# Patient Record
Sex: Female | Born: 1945 | Race: White | Hispanic: No | Marital: Married | Smoking: Never smoker
Health system: Southern US, Community
[De-identification: ages and names within clinical notes are randomized; demographics above are authoritative.]

## PROBLEM LIST (undated history)

## (undated) DIAGNOSIS — J45901 Unspecified asthma with (acute) exacerbation: Secondary | ICD-10-CM

## (undated) DIAGNOSIS — I82409 Acute embolism and thrombosis of unspecified deep veins of unspecified lower extremity: Secondary | ICD-10-CM

## (undated) DIAGNOSIS — Z9289 Personal history of other medical treatment: Secondary | ICD-10-CM

## (undated) DIAGNOSIS — R011 Cardiac murmur, unspecified: Secondary | ICD-10-CM

## (undated) DIAGNOSIS — Z5189 Encounter for other specified aftercare: Secondary | ICD-10-CM

## (undated) DIAGNOSIS — M1990 Unspecified osteoarthritis, unspecified site: Secondary | ICD-10-CM

## (undated) DIAGNOSIS — I219 Acute myocardial infarction, unspecified: Secondary | ICD-10-CM

## (undated) DIAGNOSIS — J180 Bronchopneumonia, unspecified organism: Secondary | ICD-10-CM

## (undated) DIAGNOSIS — G459 Transient cerebral ischemic attack, unspecified: Secondary | ICD-10-CM

## (undated) DIAGNOSIS — H269 Unspecified cataract: Secondary | ICD-10-CM

## (undated) DIAGNOSIS — E039 Hypothyroidism, unspecified: Secondary | ICD-10-CM

## (undated) DIAGNOSIS — K635 Polyp of colon: Secondary | ICD-10-CM

## (undated) DIAGNOSIS — K5792 Diverticulitis of intestine, part unspecified, without perforation or abscess without bleeding: Secondary | ICD-10-CM

## (undated) DIAGNOSIS — K859 Acute pancreatitis without necrosis or infection, unspecified: Secondary | ICD-10-CM

## (undated) DIAGNOSIS — E079 Disorder of thyroid, unspecified: Secondary | ICD-10-CM

## (undated) DIAGNOSIS — T7840XA Allergy, unspecified, initial encounter: Secondary | ICD-10-CM

## (undated) DIAGNOSIS — G43909 Migraine, unspecified, not intractable, without status migrainosus: Secondary | ICD-10-CM

## (undated) DIAGNOSIS — Z9889 Other specified postprocedural states: Secondary | ICD-10-CM

## (undated) HISTORY — DX: Unspecified osteoarthritis, unspecified site: M19.90

## (undated) HISTORY — DX: Diverticulitis of intestine, part unspecified, without perforation or abscess without bleeding: K57.92

## (undated) HISTORY — PX: TOTAL KNEE ARTHROPLASTY: SHX125

## (undated) HISTORY — DX: Polyp of colon: K63.5

## (undated) HISTORY — DX: Cardiac murmur, unspecified: R01.1

## (undated) HISTORY — DX: Personal history of other medical treatment: Z92.89

## (undated) HISTORY — DX: Migraine, unspecified, not intractable, without status migrainosus: G43.909

## (undated) HISTORY — DX: Allergy, unspecified, initial encounter: T78.40XA

## (undated) HISTORY — DX: Unspecified asthma with (acute) exacerbation: J45.901

## (undated) HISTORY — DX: Other specified postprocedural states: Z98.890

## (undated) HISTORY — DX: Bronchopneumonia, unspecified organism: J18.0

## (undated) HISTORY — DX: Acute embolism and thrombosis of unspecified deep veins of unspecified lower extremity: I82.409

## (undated) HISTORY — DX: Acute pancreatitis without necrosis or infection, unspecified: K85.90

## (undated) HISTORY — PX: BREAST BIOPSY: SHX20

## (undated) HISTORY — DX: Encounter for other specified aftercare: Z51.89

## (undated) HISTORY — DX: Disorder of thyroid, unspecified: E07.9

## (undated) HISTORY — DX: Unspecified cataract: H26.9

## (undated) HISTORY — DX: Transient cerebral ischemic attack, unspecified: G45.9

## (undated) HISTORY — PX: CARDIAC CATHETERIZATION: SHX172

## (undated) HISTORY — PX: NECK SURGERY: SHX720

---

## 1997-11-07 ENCOUNTER — Ambulatory Visit (HOSPITAL_COMMUNITY): Admission: RE | Admit: 1997-11-07 | Discharge: 1997-11-07 | Payer: Self-pay | Admitting: Gastroenterology

## 1997-12-24 ENCOUNTER — Ambulatory Visit (HOSPITAL_COMMUNITY): Admission: RE | Admit: 1997-12-24 | Discharge: 1997-12-24 | Payer: Self-pay | Admitting: Gastroenterology

## 1997-12-24 ENCOUNTER — Encounter: Payer: Self-pay | Admitting: Gastroenterology

## 1998-07-03 ENCOUNTER — Other Ambulatory Visit: Admission: RE | Admit: 1998-07-03 | Discharge: 1998-07-03 | Payer: Self-pay | Admitting: Obstetrics & Gynecology

## 1998-10-17 ENCOUNTER — Inpatient Hospital Stay (HOSPITAL_COMMUNITY): Admission: RE | Admit: 1998-10-17 | Discharge: 1998-10-19 | Payer: Self-pay | Admitting: Obstetrics & Gynecology

## 1998-10-17 ENCOUNTER — Encounter (INDEPENDENT_AMBULATORY_CARE_PROVIDER_SITE_OTHER): Payer: Self-pay

## 1998-10-17 HISTORY — PX: VAGINAL HYSTERECTOMY: SUR661

## 1999-07-10 ENCOUNTER — Other Ambulatory Visit: Admission: RE | Admit: 1999-07-10 | Discharge: 1999-07-10 | Payer: Self-pay | Admitting: Obstetrics & Gynecology

## 1999-08-04 ENCOUNTER — Ambulatory Visit (HOSPITAL_BASED_OUTPATIENT_CLINIC_OR_DEPARTMENT_OTHER): Admission: RE | Admit: 1999-08-04 | Discharge: 1999-08-04 | Payer: Self-pay | Admitting: General Surgery

## 1999-08-04 ENCOUNTER — Encounter: Payer: Self-pay | Admitting: General Surgery

## 2000-01-07 ENCOUNTER — Other Ambulatory Visit: Admission: RE | Admit: 2000-01-07 | Discharge: 2000-01-07 | Payer: Self-pay | Admitting: Radiology

## 2000-01-07 ENCOUNTER — Encounter (INDEPENDENT_AMBULATORY_CARE_PROVIDER_SITE_OTHER): Payer: Self-pay | Admitting: Specialist

## 2000-08-16 ENCOUNTER — Encounter: Payer: Self-pay | Admitting: Neurosurgery

## 2000-08-18 ENCOUNTER — Encounter: Payer: Self-pay | Admitting: Neurosurgery

## 2000-08-18 ENCOUNTER — Inpatient Hospital Stay (HOSPITAL_COMMUNITY): Admission: RE | Admit: 2000-08-18 | Discharge: 2000-08-20 | Payer: Self-pay | Admitting: Neurosurgery

## 2000-09-13 ENCOUNTER — Encounter: Admission: RE | Admit: 2000-09-13 | Discharge: 2000-09-13 | Payer: Self-pay | Admitting: Neurosurgery

## 2000-09-13 ENCOUNTER — Encounter: Payer: Self-pay | Admitting: Neurosurgery

## 2000-11-25 ENCOUNTER — Encounter: Admission: RE | Admit: 2000-11-25 | Discharge: 2000-11-25 | Payer: Self-pay | Admitting: Neurosurgery

## 2000-11-25 ENCOUNTER — Encounter: Payer: Self-pay | Admitting: Neurosurgery

## 2001-01-09 ENCOUNTER — Encounter: Payer: Self-pay | Admitting: Neurosurgery

## 2001-01-09 ENCOUNTER — Ambulatory Visit (HOSPITAL_COMMUNITY): Admission: RE | Admit: 2001-01-09 | Discharge: 2001-01-09 | Payer: Self-pay | Admitting: Neurosurgery

## 2001-01-20 ENCOUNTER — Encounter: Payer: Self-pay | Admitting: Neurosurgery

## 2001-01-20 ENCOUNTER — Ambulatory Visit (HOSPITAL_COMMUNITY): Admission: RE | Admit: 2001-01-20 | Discharge: 2001-01-20 | Payer: Self-pay | Admitting: Neurosurgery

## 2001-06-18 ENCOUNTER — Inpatient Hospital Stay (HOSPITAL_COMMUNITY): Admission: EM | Admit: 2001-06-18 | Discharge: 2001-06-19 | Payer: Self-pay

## 2001-06-18 ENCOUNTER — Encounter: Payer: Self-pay | Admitting: Family Medicine

## 2001-07-07 ENCOUNTER — Encounter: Admission: RE | Admit: 2001-07-07 | Discharge: 2001-07-07 | Payer: Self-pay | Admitting: Family Medicine

## 2001-11-16 ENCOUNTER — Other Ambulatory Visit: Admission: RE | Admit: 2001-11-16 | Discharge: 2001-11-16 | Payer: Self-pay | Admitting: Obstetrics & Gynecology

## 2002-04-06 DIAGNOSIS — Z9889 Other specified postprocedural states: Secondary | ICD-10-CM | POA: Insufficient documentation

## 2002-04-06 HISTORY — DX: Other specified postprocedural states: Z98.890

## 2002-09-05 ENCOUNTER — Other Ambulatory Visit: Admission: RE | Admit: 2002-09-05 | Discharge: 2002-09-05 | Payer: Self-pay | Admitting: Obstetrics & Gynecology

## 2003-09-10 ENCOUNTER — Other Ambulatory Visit: Admission: RE | Admit: 2003-09-10 | Discharge: 2003-09-10 | Payer: Self-pay | Admitting: Obstetrics & Gynecology

## 2005-03-05 ENCOUNTER — Encounter: Admission: RE | Admit: 2005-03-05 | Discharge: 2005-03-05 | Payer: Self-pay | Admitting: Rheumatology

## 2005-10-16 ENCOUNTER — Encounter: Admission: RE | Admit: 2005-10-16 | Discharge: 2005-10-16 | Payer: Self-pay | Admitting: Neurology

## 2007-10-11 ENCOUNTER — Encounter: Admission: RE | Admit: 2007-10-11 | Discharge: 2007-10-11 | Payer: Self-pay | Admitting: Orthopedic Surgery

## 2007-11-11 ENCOUNTER — Encounter: Admission: RE | Admit: 2007-11-11 | Discharge: 2007-11-11 | Payer: Self-pay | Admitting: Orthopedic Surgery

## 2008-08-24 ENCOUNTER — Encounter: Admission: RE | Admit: 2008-08-24 | Discharge: 2008-08-24 | Payer: Self-pay | Admitting: Gastroenterology

## 2009-06-05 ENCOUNTER — Ambulatory Visit (HOSPITAL_COMMUNITY): Admission: RE | Admit: 2009-06-05 | Discharge: 2009-06-05 | Payer: Self-pay | Admitting: Gastroenterology

## 2010-01-03 ENCOUNTER — Encounter: Admission: RE | Admit: 2010-01-03 | Discharge: 2010-01-03 | Payer: Self-pay | Admitting: Cardiovascular Disease

## 2010-01-09 ENCOUNTER — Encounter: Admission: RE | Admit: 2010-01-09 | Discharge: 2010-01-09 | Payer: Self-pay | Source: Home / Self Care

## 2010-01-27 DIAGNOSIS — Z9289 Personal history of other medical treatment: Secondary | ICD-10-CM

## 2010-01-27 HISTORY — DX: Personal history of other medical treatment: Z92.89

## 2010-02-07 ENCOUNTER — Ambulatory Visit (HOSPITAL_COMMUNITY)
Admission: RE | Admit: 2010-02-07 | Discharge: 2010-02-07 | Payer: Self-pay | Source: Home / Self Care | Admitting: Cardiovascular Disease

## 2010-04-15 ENCOUNTER — Encounter: Admission: RE | Admit: 2010-04-15 | Discharge: 2010-04-15 | Payer: Self-pay | Source: Home / Self Care
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
## 2010-08-22 NOTE — Op Note (Signed)
. [facility]  Patient:    Alexandria [name], Alexandria [name]                 MRN: [id number] Proc. Date: 08/18/00 Adm. 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  PROCEDURE:  Anterior cervical diskectomy, C6-7 and arthrodesis, C6-7 with 7 mm iliac crest allograft, anterior plating, Synthes 20 mm small stature plate, and 14 mm x 4 mm screws.  COMPLICATIONS:  None.  SURGEON:  Kyle L. Franky Macho, M.D.  ASSISTANT:  Danae Orleans. Venetia Maxon, M.D.  INDICATIONS:  The patient is a 65 year old woman with a right wrist drop and weakness in the C7 innovative muscles on the right side.  She has had an EMG which did not show a radial nerve palsy.  She has some mild transient extensor carpi radialis weakness in the extensor carpi ulnaris, marked weakness in the extensor digitorum communis and extensor pollicis, and give away weakness in finger flexors.  I recommended and she agreed to undergo an anterior cervical diskectomy and arthrodesis of C6-7.  DESCRIPTION OF PROCEDURE:  The patient was brought to the operating room, intubated, and placed under general anesthesia without difficulty.  The head was placed in slight extension with 10 pounds of cervical traction applied via chin strap.  Her neck was prepped and I used her old incision as a guide.  I infiltrated that with 0.5% lidocaine, 1:200,000 strength epinephrine using 2 cc.  I opened the incision with a #10 blade and took this down through the platysma.  I was able to then dissect bluntly and gain access to the anterior cervical spine.  The prevertebral tissue was cauterized in some places and sharply divided, and in other places I was able to use the Kidner to remove soft tissue.  I placed a  needle at the first rib space that I had exposed. This actually was at C6-7.  I removed the needle and opened the disk space in order to mark it.  I then used the monopolar cautery to subperiosteally reflect the longus colli muscles on both the right and left sides.  I placed self-retaining Caspar retractor.  I then placed distraction pins, one at C6 and one at C7.  I distracted the disk space, and then ________ with a progressive diskectomy using pituitary rongeurs, curets, Kerrison punches, and a high speed air drill.  I was able to remove what was a large spondylitic var coming from the superior surface of C7 posteriorly.  A great degree of spurring and compressing of the thecal sac and nerve roots there.  I was then able to use a drill to remove more bone along with a Kerrison punch until both C7 nerve roots were well decompressed bilaterally.  I irrigated the wound.  I then with Dr. Rush Farmer assistance, placed the bone graft, 7 mm into the disk space.  I then placed a 20 mm plate, putting screws two in C6 and two in C7 with locking screws.  X-ray showed that the plate was in a good position as was the blood position as was the bone plug.  There was no pressure felt in the cord.  I then irrigated the wound once more.  Controlled some minor points with bipolar cautery.  I then closed the wound in a layered fashion, reapproximating the platysma with Vicryl sutures.  The skin also with Vicryl sutures.  Dermabond used for a sterile dressing.  The patient was extubated moving all extremities.  The weight was removed after placing the bone graft. DD:  08/18/00 TD:  08/18/00 Job: [id number]

## 2010-08-22 NOTE — H&P (Signed)
[place]. [facility]  Patient:    Alexandria, [name]                 MRN: [id number] Adm. Date:  [id number] Attending:  Coletta Memos                         History and Physical  ADMITTING DIAGNOSIS:  Left C7 radiculopathy due to displaced disc at C6-C7 on the left side.  HISTORY OF PRESENT ILLNESS:  Alexandria [name] originally presented to my office on June 02, 2000.  She is the wife of a former patient of mine who presented with a wrist drop and weakness in her finger extensors since the beginning of January.  She fell while in the kitchen and struck her left arm against the corner of the wall stove and fell back hitting the left scapulae. She had some mild neck pain and does have some neck pain now.  She then developed weakness in her hand after approximately two days and by the third day, the weakness was so marked that she could not extend her wrist and she could not fully open her hand.  She also had numbness over the dorsal surface of the left hand.  At that time, she saw Dr. Noreene Filbert, neurologist, and had an ecchymosis over the back of her forearm.  This coincided with the reason of the pain which ran from the left side of the neck under the scapula down towards hyperthenar eminence.  She has had no improvement whatsoever in her motor exam since then.  Since she first saw Dr. Noreene Filbert on January 16, she had some mild strength in the extensor carpi radialis, weakness in the extensor carpi ulnaris, marked weakness in the extensor digitorum communis and extensor pollicis and give away weakness in the finger flexors.  MEDICATIONS: 1. Alexandria [name] takes pain medication to deal with the pain early in the morning. 2. Synthroid 0.125 mg once per day. 3. Diltiazem 180 mg once per day. 4. Flexeril 10 mg once per day. 5. Naprosyn 500 mg twice per day. 6. Estrogen patch.  ALLERGIES:  If she takes MOTRIN for a long period of time, it effects  her blood count, the exact way she is not sure of.  PAST MEDICAL HISTORY: 1. Diverticulosis. 2. She is 30 and right-handed. 3. She has numbness and tingling in the legs and her left hand. 4. She had a neck fusion done in the early 1980s, at [facility] in    [place]. 5. Two total knee replacements in 1994.  FAMILY HISTORY:  Mother is deceased.  Father is deceased.  Cancer present on both sides of the family.  SOCIAL HISTORY:  She does not smoke.  She does drink socially.  She does not use illicit drugs.  She has lost 100 pounds.  She is 5 feet 4 inches tall, weighing 146 pounds.  PHYSICAL EXAMINATION:  Neurologic:  She is alert and oriented and answers all questions appropriately;  3/5 strength in her wrist extensor, 0/5 finger extensors, 2/5 grip.  Weakness in the intrinsic muscles of the left hand;  4/5 strength in the triceps on the left side.  Right side is normal.  Lower extremities with good strength bilaterally.  Deep tendon reflexes 1+ at the triceps, 2+ at the biceps and brachial radialis bilaterally.  Proprioception and pinprick are intact bilaterally.  Full range of motion in the neck. Well-kempt and in no distress.  Memory and attention span are normal.  MRI shows displaced disc at C6-C7 on the left side compromising the left C7 nerve root.  She has effusion at C5-C6 and some osteophytes present.  No spinal cord or nerve root compression.  The spinal cord looks good.  Some spondylitic changes present at C6-C7 on the left side.  ASSESSMENT/PLAN:  Alexandria [name] presented with a serious problem.  I explained to her and she understands that she might loose function in the wrist if she did nothing.  She needed to wait and decided that it was a chance that she would take.  She came back to see me on August 02, 2000, and that time she decided that she wanted to go ahead with her procedure.  She does have an incision at C5 for her old operation on the right  side and I will go back through the same incision.  She understands the need for a fusion and the fact that a fusion can fail secondary to the bone not healing, screws breaking or the plate breaking.  She understands that she may have a recurrent laryngeal nerve injury causing hoarseness.  She also understands that she may not regain function secondary to the C7 radiculopathy. DD:  08/18/00 TD:  08/18/00 Job: [id number]

## 2011-04-24 DIAGNOSIS — K869 Disease of pancreas, unspecified: Secondary | ICD-10-CM | POA: Diagnosis not present

## 2011-04-24 DIAGNOSIS — R6889 Other general symptoms and signs: Secondary | ICD-10-CM | POA: Diagnosis not present

## 2011-04-24 DIAGNOSIS — R109 Unspecified abdominal pain: Secondary | ICD-10-CM | POA: Diagnosis not present

## 2011-05-06 DIAGNOSIS — Z1231 Encounter for screening mammogram for malignant neoplasm of breast: Secondary | ICD-10-CM | POA: Diagnosis not present

## 2011-05-13 DIAGNOSIS — E039 Hypothyroidism, unspecified: Secondary | ICD-10-CM | POA: Diagnosis not present

## 2011-05-13 DIAGNOSIS — R0609 Other forms of dyspnea: Secondary | ICD-10-CM | POA: Diagnosis not present

## 2011-05-13 DIAGNOSIS — R0989 Other specified symptoms and signs involving the circulatory and respiratory systems: Secondary | ICD-10-CM | POA: Diagnosis not present

## 2011-05-13 DIAGNOSIS — I10 Essential (primary) hypertension: Secondary | ICD-10-CM | POA: Diagnosis not present

## 2011-05-20 DIAGNOSIS — D239 Other benign neoplasm of skin, unspecified: Secondary | ICD-10-CM | POA: Diagnosis not present

## 2011-05-21 DIAGNOSIS — R0602 Shortness of breath: Secondary | ICD-10-CM | POA: Diagnosis not present

## 2011-05-21 DIAGNOSIS — E039 Hypothyroidism, unspecified: Secondary | ICD-10-CM | POA: Diagnosis not present

## 2011-05-21 DIAGNOSIS — Z9289 Personal history of other medical treatment: Secondary | ICD-10-CM | POA: Insufficient documentation

## 2011-05-21 DIAGNOSIS — R5381 Other malaise: Secondary | ICD-10-CM | POA: Diagnosis not present

## 2011-05-21 DIAGNOSIS — I10 Essential (primary) hypertension: Secondary | ICD-10-CM | POA: Diagnosis not present

## 2011-05-21 DIAGNOSIS — E782 Mixed hyperlipidemia: Secondary | ICD-10-CM | POA: Diagnosis not present

## 2011-05-21 DIAGNOSIS — R079 Chest pain, unspecified: Secondary | ICD-10-CM | POA: Diagnosis not present

## 2011-05-21 DIAGNOSIS — I4510 Unspecified right bundle-branch block: Secondary | ICD-10-CM | POA: Diagnosis not present

## 2011-05-21 HISTORY — DX: Personal history of other medical treatment: Z92.89

## 2011-05-29 DIAGNOSIS — R05 Cough: Secondary | ICD-10-CM | POA: Diagnosis not present

## 2011-05-29 DIAGNOSIS — R059 Cough, unspecified: Secondary | ICD-10-CM | POA: Diagnosis not present

## 2011-05-29 DIAGNOSIS — J019 Acute sinusitis, unspecified: Secondary | ICD-10-CM | POA: Diagnosis not present

## 2011-06-04 DIAGNOSIS — E039 Hypothyroidism, unspecified: Secondary | ICD-10-CM | POA: Diagnosis not present

## 2011-06-24 DIAGNOSIS — R5381 Other malaise: Secondary | ICD-10-CM | POA: Diagnosis not present

## 2011-06-24 DIAGNOSIS — E039 Hypothyroidism, unspecified: Secondary | ICD-10-CM | POA: Diagnosis not present

## 2011-06-26 DIAGNOSIS — R5383 Other fatigue: Secondary | ICD-10-CM | POA: Diagnosis not present

## 2011-06-26 DIAGNOSIS — E039 Hypothyroidism, unspecified: Secondary | ICD-10-CM | POA: Diagnosis not present

## 2011-06-26 DIAGNOSIS — R5381 Other malaise: Secondary | ICD-10-CM | POA: Diagnosis not present

## 2011-06-28 DIAGNOSIS — R5381 Other malaise: Secondary | ICD-10-CM | POA: Diagnosis not present

## 2011-06-28 DIAGNOSIS — R5383 Other fatigue: Secondary | ICD-10-CM | POA: Diagnosis not present

## 2011-07-28 DIAGNOSIS — G609 Hereditary and idiopathic neuropathy, unspecified: Secondary | ICD-10-CM | POA: Diagnosis not present

## 2011-07-28 DIAGNOSIS — Z79899 Other long term (current) drug therapy: Secondary | ICD-10-CM | POA: Diagnosis not present

## 2011-07-28 DIAGNOSIS — Z5181 Encounter for therapeutic drug level monitoring: Secondary | ICD-10-CM | POA: Diagnosis not present

## 2011-07-28 DIAGNOSIS — M79609 Pain in unspecified limb: Secondary | ICD-10-CM | POA: Diagnosis not present

## 2011-07-29 DIAGNOSIS — Z9189 Other specified personal risk factors, not elsewhere classified: Secondary | ICD-10-CM | POA: Diagnosis not present

## 2011-07-29 DIAGNOSIS — Z124 Encounter for screening for malignant neoplasm of cervix: Secondary | ICD-10-CM | POA: Diagnosis not present

## 2011-07-29 DIAGNOSIS — Z130 Encounter for screening for diseases of the blood and blood-forming organs and certain disorders involving the immune mechanism: Secondary | ICD-10-CM | POA: Diagnosis not present

## 2011-09-14 DIAGNOSIS — R109 Unspecified abdominal pain: Secondary | ICD-10-CM | POA: Diagnosis not present

## 2011-09-14 DIAGNOSIS — K5732 Diverticulitis of large intestine without perforation or abscess without bleeding: Secondary | ICD-10-CM | POA: Diagnosis not present

## 2011-09-14 DIAGNOSIS — R3915 Urgency of urination: Secondary | ICD-10-CM | POA: Diagnosis not present

## 2011-09-14 DIAGNOSIS — K861 Other chronic pancreatitis: Secondary | ICD-10-CM | POA: Diagnosis not present

## 2011-09-14 DIAGNOSIS — K5730 Diverticulosis of large intestine without perforation or abscess without bleeding: Secondary | ICD-10-CM | POA: Diagnosis not present

## 2011-09-23 DIAGNOSIS — L608 Other nail disorders: Secondary | ICD-10-CM | POA: Diagnosis not present

## 2011-09-23 DIAGNOSIS — B359 Dermatophytosis, unspecified: Secondary | ICD-10-CM | POA: Diagnosis not present

## 2011-09-23 DIAGNOSIS — M79609 Pain in unspecified limb: Secondary | ICD-10-CM | POA: Diagnosis not present

## 2011-09-23 DIAGNOSIS — Z79899 Other long term (current) drug therapy: Secondary | ICD-10-CM | POA: Diagnosis not present

## 2011-10-27 DIAGNOSIS — E039 Hypothyroidism, unspecified: Secondary | ICD-10-CM | POA: Diagnosis not present

## 2011-10-28 DIAGNOSIS — Z79899 Other long term (current) drug therapy: Secondary | ICD-10-CM | POA: Diagnosis not present

## 2011-10-28 DIAGNOSIS — B359 Dermatophytosis, unspecified: Secondary | ICD-10-CM | POA: Diagnosis not present

## 2011-10-28 DIAGNOSIS — B351 Tinea unguium: Secondary | ICD-10-CM | POA: Diagnosis not present

## 2011-10-28 DIAGNOSIS — S93609A Unspecified sprain of unspecified foot, initial encounter: Secondary | ICD-10-CM | POA: Diagnosis not present

## 2011-12-18 ENCOUNTER — Encounter: Payer: Self-pay | Admitting: Family Medicine

## 2011-12-18 ENCOUNTER — Ambulatory Visit (INDEPENDENT_AMBULATORY_CARE_PROVIDER_SITE_OTHER): Payer: Medicare Other | Admitting: Family Medicine

## 2011-12-18 VITALS — BP 116/72 | HR 63 | Temp 98.5°F | Ht 63.75 in | Wt 150.2 lb

## 2011-12-18 DIAGNOSIS — R109 Unspecified abdominal pain: Secondary | ICD-10-CM | POA: Diagnosis not present

## 2011-12-18 DIAGNOSIS — K219 Gastro-esophageal reflux disease without esophagitis: Secondary | ICD-10-CM | POA: Diagnosis not present

## 2011-12-18 LAB — POCT URINALYSIS DIPSTICK
Bilirubin, UA: NEGATIVE
Blood, UA: NEGATIVE
Glucose, UA: NEGATIVE
Leukocytes, UA: NEGATIVE
Nitrite, UA: NEGATIVE
pH, UA: 6.5

## 2011-12-18 LAB — CBC WITH DIFFERENTIAL/PLATELET
Basophils Absolute: 0 10*3/uL (ref 0.0–0.1)
Basophils Relative: 0.9 % (ref 0.0–3.0)
HCT: 37.8 % (ref 36.0–46.0)
Lymphocytes Relative: 35.6 % (ref 12.0–46.0)
Monocytes Relative: 15.1 % — ABNORMAL HIGH (ref 3.0–12.0)
Neutrophils Relative %: 45.7 % (ref 43.0–77.0)

## 2011-12-18 LAB — HEPATIC FUNCTION PANEL
ALT: 22 U/L (ref 0–35)
Total Bilirubin: 0.5 mg/dL (ref 0.3–1.2)
Total Protein: 6.7 g/dL (ref 6.0–8.3)

## 2011-12-18 LAB — BASIC METABOLIC PANEL
Creatinine, Ser: 0.8 mg/dL (ref 0.4–1.2)
Glucose, Bld: 97 mg/dL (ref 70–99)

## 2011-12-18 MED ORDER — OMEPRAZOLE MAGNESIUM 20 MG PO TBEC: 20.0000 mg | DELAYED_RELEASE_TABLET | Freq: Every day | ORAL | Status: DC

## 2011-12-18 NOTE — Patient Instructions (Signed)
Diverticulitis A diverticulum is a small pouch or sac on the colon. Diverticulosis is the presence of these diverticula on the colon. Diverticulitis is the irritation (inflammation) or infection of diverticula. CAUSES  The colon and its diverticula contain bacteria. If food particles block the tiny opening to a diverticulum, the bacteria inside can grow and cause an increase in pressure. This leads to infection and inflammation and is called diverticulitis. SYMPTOMS   Abdominal pain and tenderness. Usually, the pain is located on the left side of your abdomen. However, it could be located elsewhere.   Fever.   Bloating.   Feeling sick to your stomach (nausea).   Throwing up (vomiting).   Abnormal stools.  DIAGNOSIS  Your caregiver will take a history and perform a physical exam. Since many things can cause abdominal pain, other tests may be necessary. Tests may include:  Blood tests.   Urine tests.   X-ray of the abdomen.   CT scan of the abdomen.  Sometimes, surgery is needed to determine if diverticulitis or other conditions are causing your symptoms. TREATMENT  Most of the time, you can be treated without surgery. Treatment includes:  Resting the bowels by only having liquids for a few days. As you improve, you will need to eat a low-fiber diet.   Intravenous (IV) fluids if you are losing body fluids (dehydrated).   Antibiotic medicines that treat infections may be given.   Pain and nausea medicine, if needed.   Surgery if the inflamed diverticulum has burst.  HOME CARE INSTRUCTIONS   Try a clear liquid diet (broth, tea, or water for as long as directed by your caregiver). You may then gradually begin a low-fiber diet as tolerated. A low-fiber diet is a diet with less than 10 grams of fiber. Choose the foods below to reduce fiber in the diet:   White breads, cereals, rice, and pasta.   Cooked fruits and vegetables or soft fresh fruits and vegetables without the skin.     Ground or well-cooked tender beef, ham, veal, lamb, pork, or poultry.   Eggs and seafood.   After your diverticulitis symptoms have improved, your caregiver may put you on a high-fiber diet. A high-fiber diet includes 14 grams of fiber for every 1000 calories consumed. For a standard 2000 calorie diet, you would need 28 grams of fiber. Follow these diet guidelines to help you increase the fiber in your diet. It is important to slowly increase the amount fiber in your diet to avoid gas, constipation, and bloating.   Choose whole-grain breads, cereals, pasta, and brown rice.   Choose fresh fruits and vegetables with the skin on. Do not overcook vegetables because the more vegetables are cooked, the more fiber is lost.   Choose more nuts, seeds, legumes, dried peas, beans, and lentils.   Look for food products that have greater than 3 grams of fiber per serving on the Nutrition Facts label.   Take all medicine as directed by your caregiver.   If your caregiver has given you a follow-up appointment, it is very important that you go. Not going could result in lasting (chronic) or permanent injury, pain, and disability. If there is any problem keeping the appointment, call to reschedule.  SEEK MEDICAL CARE IF:   Your pain does not improve.   You have a hard time advancing your diet beyond clear liquids.   Your bowel movements do not return to normal.  SEEK IMMEDIATE MEDICAL CARE IF:   Your pain becomes   worse.   You have an oral temperature above 102 F (38.9 C), not controlled by medicine.   You have repeated vomiting.   You have bloody or black, tarry stools.   Symptoms that brought you to your caregiver become worse or are not getting better.  MAKE SURE YOU:   Understand these instructions.   Will watch your condition.   Will get help right away if you are not doing well or get worse.  Document Released: 12/31/2004 Document Revised: 03/12/2011 Document Reviewed:  04/28/2010 [employer] Patient Information 2012 [employer]. 

## 2011-12-18 NOTE — Progress Notes (Signed)
Subjective:     Alexandria [name] is a 66 y.o. female who presents for evaluation of abdominal pain. Onset was 15 years ago. Symptoms have been gradually worsening. The pain is described as sharp and shooting, and is 5/10 in intensity -- on bad days 10/10. Pain is located in the left side under ribs without radiation.  Aggravating factors: eating., sitting down  Alleviating factors: belching and bowel movements. Associated symptoms: belching, constipation, diarrhea and nausea. The patient denies anorexia, arthralagias, chills, dysuria, fever, flatus, frequency, headache, hematochezia, hematuria, melena, myalgias, sweats and vomiting.  allergies, current medications, past family history, past medical history, past social history, past surgical history and problem list Past Medical History  Diagnosis Date  . Arthritis   . Diverticulitis   . Migraines   . DVT (deep venous thrombosis)     lower extremity  . Colon polyp   . Thyroid disease   . TIA (transient ischemic attack)     8 years ago  . Heart attack     10 years ago  . Heart murmur    There is no problem list on file for this patient.  Past Surgical History  Procedure Date  . Vaginal hysterectomy 10/17/1998    Konrad Dolores  . Breast biopsy     Yolanda Bonine  . Total knee arthroplasty     Bilateral x's 2  . Neck surgery     x's 2   Family History  Problem Relation Age of Onset  . Arthritis    . Colon cancer Father   . HIV Brother   . Kidney cancer Brother   . Colon cancer Brother   . Lung cancer Brother   . Other Brother     Mouth Cancer  . Ovarian cancer Mother   . Uterine cancer Mother   . Prostate cancer Father   . Heart disease Maternal Grandmother   . Stroke Maternal Grandmother   . Hypertension Maternal Grandmother   . Diabetes Maternal Grandmother    History   Social History  . Marital Status: Married    Spouse Name: N/A    Number of Children: N/A  . Years of Education: N/A   Social History Main Topics    . Smoking status: Never Smoker   . Smokeless tobacco: Never Used  . Alcohol Use: No  . Drug Use: No  . Sexually Active: None   Other Topics Concern  . None   Social History Narrative  . None   Current Outpatient Prescriptions  Medication Sig Dispense Refill  . BYSTOLIC 5 MG tablet Take 1 tablet by mouth daily.      . clopidogrel (PLAVIX) 75 MG tablet Take 1 tablet by mouth daily.      Marland Kitchen CREON 24000 UNITS CPEP Take by mouth as needed.      . dicyclomine (BENTYL) 20 MG tablet Take 4 tablets by mouth daily.      Marland Kitchen estradiol (ESTRACE) 2 MG tablet Take 1 tablet by mouth 3 x daily with food.      . furosemide (LASIX) 40 MG tablet Take 1 tablet by mouth daily.      Marland Kitchen KLOR-CON M20 20 MEQ tablet Take 1.5 tablets by mouth daily.      Marland Kitchen lactulose (CHRONULAC) 10 GM/15ML solution Take by mouth as needed.      Marland Kitchen SYNTHROID 137 MCG tablet Take 1 tablet by mouth daily.      . valsartan (DIOVAN) 80 MG tablet Take 40 mg by mouth daily.  Allergies: Prednisone  Review of Systems Pertinent items are noted in HPI.     Objective:    BP 116/72  Pulse 63  Temp 98.5 F (36.9 C) (Oral)  Ht 5' 3.75" (1.619 m)  Wt 150 lb 3.2 oz (68.13 kg)  BMI 25.98 kg/m2  SpO2 97% General appearance: alert, cooperative, appears stated age and no distress Lungs: clear to auscultation bilaterally Abdomen: abnormal findings:  distended, guarding and moderate tenderness in the epigastrium and in the LLQ Extremities: extremities normal, atraumatic, no cyanosis or edema Lymph nodes: Cervical, supraclavicular, and axillary nodes normal.    Assessment:    Abdominal pain, likely secondary to gerd and or diverticulitis .    Plan:    The diagnosis was discussed with the patient and evaluation and treatment plans outlined. See orders for lab and imaging studies. Adhere to simple, bland diet. Initiate empiric trial of acid suppression; see orders. Further follow-up plans will be based on outcome of lab/imaging  studies; see orders. Follow up as needed.    Symptoms have been going on for years but recently worse---- consider GI at [facility] if no better

## 2011-12-24 ENCOUNTER — Other Ambulatory Visit: Payer: Self-pay | Admitting: Family Medicine

## 2011-12-24 ENCOUNTER — Ambulatory Visit (HOSPITAL_BASED_OUTPATIENT_CLINIC_OR_DEPARTMENT_OTHER)
Admission: RE | Admit: 2011-12-24 | Discharge: 2011-12-24 | Disposition: A | Discharge status: Home / Self Care | Payer: Medicare Other | Source: Ambulatory Visit | Attending: Family Medicine | Admitting: Family Medicine

## 2011-12-24 DIAGNOSIS — R109 Unspecified abdominal pain: Secondary | ICD-10-CM

## 2011-12-24 DIAGNOSIS — R599 Enlarged lymph nodes, unspecified: Secondary | ICD-10-CM | POA: Diagnosis not present

## 2011-12-24 MED ORDER — IOHEXOL 300 MG/ML  SOLN
100.0000 mL | Freq: Once | INTRAMUSCULAR | Status: AC | PRN
  Administered 2011-12-24: 100 mL via INTRAVENOUS

## 2011-12-25 ENCOUNTER — Other Ambulatory Visit (HOSPITAL_BASED_OUTPATIENT_CLINIC_OR_DEPARTMENT_OTHER): Payer: Medicare Other

## 2011-12-31 ENCOUNTER — Ambulatory Visit: Payer: Medicare Other | Admitting: Family Medicine

## 2012-01-06 ENCOUNTER — Ambulatory Visit (INDEPENDENT_AMBULATORY_CARE_PROVIDER_SITE_OTHER): Payer: Medicare Other | Admitting: Family Medicine

## 2012-01-06 ENCOUNTER — Encounter: Payer: Self-pay | Admitting: Family Medicine

## 2012-01-06 VITALS — BP 126/74 | HR 55 | Temp 98.1°F | Wt 153.0 lb

## 2012-01-06 DIAGNOSIS — E785 Hyperlipidemia, unspecified: Secondary | ICD-10-CM

## 2012-01-06 DIAGNOSIS — E669 Obesity, unspecified: Secondary | ICD-10-CM

## 2012-01-06 DIAGNOSIS — R109 Unspecified abdominal pain: Secondary | ICD-10-CM | POA: Insufficient documentation

## 2012-01-06 DIAGNOSIS — I10 Essential (primary) hypertension: Secondary | ICD-10-CM

## 2012-01-06 DIAGNOSIS — E119 Type 2 diabetes mellitus without complications: Secondary | ICD-10-CM

## 2012-01-06 DIAGNOSIS — R42 Dizziness and giddiness: Secondary | ICD-10-CM | POA: Diagnosis not present

## 2012-01-06 DIAGNOSIS — M542 Cervicalgia: Secondary | ICD-10-CM | POA: Diagnosis not present

## 2012-01-06 DIAGNOSIS — Z136 Encounter for screening for cardiovascular disorders: Secondary | ICD-10-CM | POA: Diagnosis not present

## 2012-01-06 DIAGNOSIS — E162 Hypoglycemia, unspecified: Secondary | ICD-10-CM | POA: Insufficient documentation

## 2012-01-06 DIAGNOSIS — E1169 Type 2 diabetes mellitus with other specified complication: Secondary | ICD-10-CM

## 2012-01-06 DIAGNOSIS — Z0000 Encounter for general adult medical examination without abnormal findings: Secondary | ICD-10-CM

## 2012-01-06 NOTE — Assessment & Plan Note (Signed)
Normal in recent blood work. See #2

## 2012-01-06 NOTE — Progress Notes (Signed)
  Subjective:    Patient ID: Alexandria [name], female    DOB: 23-Feb-1946, 66 y.o.   MRN: [id number]  HPI Pt here to discuss CT and is frustrated that it was normal when she has so much pain.  Pt also c/o episodes of dizziness and hot flashes.  It has occurred 2x--- first time it was several years ago and she had just had 3 hr GTT---- then went to [employer] without eating and passed out.  The mose recent episode was  A few days ago and she had not had much to eat then either.  No chest pain or sob.     Review of Systems as above   Objective:   Physical Exam  Constitutional: She is oriented to person, place, and time. She appears well-developed and well-nourished.  Cardiovascular: Normal rate, regular rhythm and normal heart sounds.   Pulmonary/Chest: Effort normal and breath sounds normal. No respiratory distress. She has no wheezes.  Neurological: She is alert and oriented to person, place, and time.  Psychiatric: She has a normal mood and affect. Her behavior is normal. Judgment and thought content normal.          Assessment & Plan:

## 2012-01-06 NOTE — Assessment & Plan Note (Signed)
Maybe seconary to #1 Encouraged pt to eat small protein snake q2-3 hours Cardiac work up done and neg per pt---she is waiting for  Call back from them

## 2012-01-06 NOTE — Assessment & Plan Note (Signed)
Ct normal as are labs Pt has appointment pending with [facility]

## 2012-01-06 NOTE — Patient Instructions (Addendum)
Preventive Care for Adults, Female A healthy lifestyle and preventive care can promote health and wellness. Preventive health guidelines for women include the following key practices.  A routine yearly physical is a good way to check with your caregiver about your health and preventive screening. It is a chance to share any concerns and updates on your health, and to receive a thorough exam.  Visit your dentist for a routine exam and preventive care every 6 months. Brush your teeth twice a day and floss once a day. Good oral hygiene prevents tooth decay and gum disease.  The frequency of eye exams is based on your age, health, family medical history, use of contact lenses, and other factors. Follow your caregiver's recommendations for frequency of eye exams.  Eat a healthy diet. Foods like vegetables, fruits, whole grains, low-fat dairy products, and lean protein foods contain the nutrients you need without too many calories. Decrease your intake of foods high in solid fats, added sugars, and salt. Eat the right amount of calories for you.Get information about a proper diet from your caregiver, if necessary.  Regular physical exercise is one of the most important things you can do for your health. Most adults should get at least 150 minutes of moderate-intensity exercise (any activity that increases your heart rate and causes you to sweat) each week. In addition, most adults need muscle-strengthening exercises on 2 or more days a week.  Maintain a healthy weight. The body mass index (BMI) is a screening tool to identify possible weight problems. It provides an estimate of body fat based on height and weight. Your caregiver can help determine your BMI, and can help you achieve or maintain a healthy weight.For adults 20 years and older:  A BMI below 18.5 is considered underweight.  A BMI of 18.5 to 24.9 is normal.  A BMI of 25 to 29.9 is considered overweight.  A BMI of 30 and above is  considered obese.  Maintain normal blood lipids and cholesterol levels by exercising and minimizing your intake of saturated fat. Eat a balanced diet with plenty of fruit and vegetables. Blood tests for lipids and cholesterol should begin at age 20 and be repeated every 5 years. If your lipid or cholesterol levels are high, you are over 50, or you are at high risk for heart disease, you may need your cholesterol levels checked more frequently.Ongoing high lipid and cholesterol levels should be treated with medicines if diet and exercise are not effective.  If you smoke, find out from your caregiver how to quit. If you do not use tobacco, do not start.  If you are pregnant, do not drink alcohol. If you are breastfeeding, be very cautious about drinking alcohol. If you are not pregnant and choose to drink alcohol, do not exceed 1 drink per day. One drink is considered to be 12 ounces (355 mL) of beer, 5 ounces (148 mL) of wine, or 1.5 ounces (44 mL) of liquor.  Avoid use of street drugs. Do not share needles with anyone. Ask for help if you need support or instructions about stopping the use of drugs.  High blood pressure causes heart disease and increases the risk of stroke. Your blood pressure should be checked at least every 1 to 2 years. Ongoing high blood pressure should be treated with medicines if weight loss and exercise are not effective.  If you are 55 to 67 years old, ask your caregiver if you should take aspirin to prevent strokes.  Diabetes   screening involves taking a blood sample to check your fasting blood sugar level. This should be done once every 3 years, after age 45, if you are within normal weight and without risk factors for diabetes. Testing should be considered at a younger age or be carried out more frequently if you are overweight and have at least 1 risk factor for diabetes.  Breast cancer screening is essential preventive care for women. You should practice "breast  self-awareness." This means understanding the normal appearance and feel of your breasts and may include breast self-examination. Any changes detected, no matter how small, should be reported to a caregiver. Women in their 20s and 30s should have a clinical breast exam (CBE) by a caregiver as part of a regular health exam every 1 to 3 years. After age 40, women should have a CBE every year. Starting at age 40, women should consider having a mammography (breast X-ray test) every year. Women who have a family history of breast cancer should talk to their caregiver about genetic screening. Women at a high risk of breast cancer should talk to their caregivers about having magnetic resonance imaging (MRI) and a mammography every year.  The Pap test is a screening test for cervical cancer. A Pap test can show cell changes on the cervix that might become cervical cancer if left untreated. A Pap test is a procedure in which cells are obtained and examined from the lower end of the uterus (cervix).  Women should have a Pap test starting at age 21.  Between ages 21 and 29, Pap tests should be repeated every 2 years.  Beginning at age 30, you should have a Pap test every 3 years as long as the past 3 Pap tests have been normal.  Some women have medical problems that increase the chance of getting cervical cancer. Talk to your caregiver about these problems. It is especially important to talk to your caregiver if a new problem develops soon after your last Pap test. In these cases, your caregiver may recommend more frequent screening and Pap tests.  The above recommendations are the same for women who have or have not gotten the vaccine for human papillomavirus (HPV).  If you had a hysterectomy for a problem that was not cancer or a condition that could lead to cancer, then you no longer need Pap tests. Even if you no longer need a Pap test, a regular exam is a good idea to make sure no other problems are  starting.  If you are between ages 65 and 70, and you have had normal Pap tests going back 10 years, you no longer need Pap tests. Even if you no longer need a Pap test, a regular exam is a good idea to make sure no other problems are starting.  If you have had past treatment for cervical cancer or a condition that could lead to cancer, you need Pap tests and screening for cancer for at least 20 years after your treatment.  If Pap tests have been discontinued, risk factors (such as a new sexual partner) need to be reassessed to determine if screening should be resumed.  The HPV test is an additional test that may be used for cervical cancer screening. The HPV test looks for the virus that can cause the cell changes on the cervix. The cells collected during the Pap test can be tested for HPV. The HPV test could be used to screen women aged 30 years and older, and should   be used in women of any age who have unclear Pap test results. After the age of 30, women should have HPV testing at the same frequency as a Pap test.  Colorectal cancer can be detected and often prevented. Most routine colorectal cancer screening begins at the age of 50 and continues through age 75. However, your caregiver may recommend screening at an earlier age if you have risk factors for colon cancer. On a yearly basis, your caregiver may provide home test kits to check for hidden blood in the stool. Use of a small camera at the end of a tube, to directly examine the colon (sigmoidoscopy or colonoscopy), can detect the earliest forms of colorectal cancer. Talk to your caregiver about this at age 50, when routine screening begins. Direct examination of the colon should be repeated every 5 to 10 years through age 75, unless early forms of pre-cancerous polyps or small growths are found.  Hepatitis C blood testing is recommended for all people born from 1945 through 1965 and any individual with known risks for hepatitis C.  Practice  safe sex. Use condoms and avoid high-risk sexual practices to reduce the spread of sexually transmitted infections (STIs). STIs include gonorrhea, chlamydia, syphilis, trichomonas, herpes, HPV, and human immunodeficiency virus (HIV). Herpes, HIV, and HPV are viral illnesses that have no cure. They can result in disability, cancer, and death. Sexually active women aged 25 and younger should be checked for chlamydia. Older women with new or multiple partners should also be tested for chlamydia. Testing for other STIs is recommended if you are sexually active and at increased risk.  Osteoporosis is a disease in which the bones lose minerals and strength with aging. This can result in serious bone fractures. The risk of osteoporosis can be identified using a bone density scan. Women ages 65 and over and women at risk for fractures or osteoporosis should discuss screening with their caregivers. Ask your caregiver whether you should take a calcium supplement or vitamin D to reduce the rate of osteoporosis.  Menopause can be associated with physical symptoms and risks. Hormone replacement therapy is available to decrease symptoms and risks. You should talk to your caregiver about whether hormone replacement therapy is right for you.  Use sunscreen with sun protection factor (SPF) of 30 or more. Apply sunscreen liberally and repeatedly throughout the day. You should seek shade when your shadow is shorter than you. Protect yourself by wearing long sleeves, pants, a wide-brimmed hat, and sunglasses year round, whenever you are outdoors.  Once a month, do a whole body skin exam, using a mirror to look at the skin on your back. Notify your caregiver of new moles, moles that have irregular borders, moles that are larger than a pencil eraser, or moles that have changed in shape or color.  Stay current with required immunizations.  Influenza. You need a dose every fall (or winter). The composition of the flu vaccine  changes each year, so being vaccinated once is not enough.  Pneumococcal polysaccharide. You need 1 to 2 doses if you smoke cigarettes or if you have certain chronic medical conditions. You need 1 dose at age 65 (or older) if you have never been vaccinated.  Tetanus, diphtheria, pertussis (Tdap, Td). Get 1 dose of Tdap vaccine if you are younger than age 65, are over 65 and have contact with an infant, are a healthcare worker, are pregnant, or simply want to be protected from whooping cough. After that, you need a Td   booster dose every 10 years. Consult your caregiver if you have not had at least 3 tetanus and diphtheria-containing shots sometime in your life or have a deep or dirty wound.  HPV. You need this vaccine if you are a woman age 26 or younger. The vaccine is given in 3 doses over 6 months.  Measles, mumps, rubella (MMR). You need at least 1 dose of MMR if you were born in 1957 or later. You may also need a second dose.  Meningococcal. If you are age 19 to 21 and a first-year college student living in a residence hall, or have one of several medical conditions, you need to get vaccinated against meningococcal disease. You may also need additional booster doses.  Zoster (shingles). If you are age 60 or older, you should get this vaccine.  Varicella (chickenpox). If you have never had chickenpox or you were vaccinated but received only 1 dose, talk to your caregiver to find out if you need this vaccine.  Hepatitis A. You need this vaccine if you have a specific risk factor for hepatitis A virus infection or you simply wish to be protected from this disease. The vaccine is usually given as 2 doses, 6 to 18 months apart.  Hepatitis B. You need this vaccine if you have a specific risk factor for hepatitis B virus infection or you simply wish to be protected from this disease. The vaccine is given in 3 doses, usually over 6 months. Preventive Services / Frequency Ages 19 to 39  Blood  pressure check.** / Every 1 to 2 years.  Lipid and cholesterol check.** / Every 5 years beginning at age 20.  Clinical breast exam.** / Every 3 years for women in their 20s and 30s.  Pap test.** / Every 2 years from ages 21 through 29. Every 3 years starting at age 30 through age 65 or 70 with a history of 3 consecutive normal Pap tests.  HPV screening.** / Every 3 years from ages 30 through ages 65 to 70 with a history of 3 consecutive normal Pap tests.  Hepatitis C blood test.** / For any individual with known risks for hepatitis C.  Skin self-exam. / Monthly.  Influenza immunization.** / Every year.  Pneumococcal polysaccharide immunization.** / 1 to 2 doses if you smoke cigarettes or if you have certain chronic medical conditions.  Tetanus, diphtheria, pertussis (Tdap, Td) immunization. / A one-time dose of Tdap vaccine. After that, you need a Td booster dose every 10 years.  HPV immunization. / 3 doses over 6 months, if you are 26 and younger.  Measles, mumps, rubella (MMR) immunization. / You need at least 1 dose of MMR if you were born in 1957 or later. You may also need a second dose.  Meningococcal immunization. / 1 dose if you are age 19 to 21 and a first-year college student living in a residence hall, or have one of several medical conditions, you need to get vaccinated against meningococcal disease. You may also need additional booster doses.  Varicella immunization.** / Consult your caregiver.  Hepatitis A immunization.** / Consult your caregiver. 2 doses, 6 to 18 months apart.  Hepatitis B immunization.** / Consult your caregiver. 3 doses usually over 6 months. Ages 40 to 64  Blood pressure check.** / Every 1 to 2 years.  Lipid and cholesterol check.** / Every 5 years beginning at age 20.  Clinical breast exam.** / Every year after age 40.  Mammogram.** / Every year beginning at age 40   and continuing for as long as you are in good health. Consult with your  caregiver.  Pap test.** / Every 3 years starting at age 30 through age 65 or 70 with a history of 3 consecutive normal Pap tests.  HPV screening.** / Every 3 years from ages 30 through ages 65 to 70 with a history of 3 consecutive normal Pap tests.  Fecal occult blood test (FOBT) of stool. / Every year beginning at age 50 and continuing until age 75. You may not need to do this test if you get a colonoscopy every 10 years.  Flexible sigmoidoscopy or colonoscopy.** / Every 5 years for a flexible sigmoidoscopy or every 10 years for a colonoscopy beginning at age 50 and continuing until age 75.  Hepatitis C blood test.** / For all people born from 1945 through 1965 and any individual with known risks for hepatitis C.  Skin self-exam. / Monthly.  Influenza immunization.** / Every year.  Pneumococcal polysaccharide immunization.** / 1 to 2 doses if you smoke cigarettes or if you have certain chronic medical conditions.  Tetanus, diphtheria, pertussis (Tdap, Td) immunization.** / A one-time dose of Tdap vaccine. After that, you need a Td booster dose every 10 years.  Measles, mumps, rubella (MMR) immunization. / You need at least 1 dose of MMR if you were born in 1957 or later. You may also need a second dose.  Varicella immunization.** / Consult your caregiver.  Meningococcal immunization.** / Consult your caregiver.  Hepatitis A immunization.** / Consult your caregiver. 2 doses, 6 to 18 months apart.  Hepatitis B immunization.** / Consult your caregiver. 3 doses, usually over 6 months. Ages 65 and over  Blood pressure check.** / Every 1 to 2 years.  Lipid and cholesterol check.** / Every 5 years beginning at age 20.  Clinical breast exam.** / Every year after age 40.  Mammogram.** / Every year beginning at age 40 and continuing for as long as you are in good health. Consult with your caregiver.  Pap test.** / Every 3 years starting at age 30 through age 65 or 70 with a 3  consecutive normal Pap tests. Testing can be stopped between 65 and 70 with 3 consecutive normal Pap tests and no abnormal Pap or HPV tests in the past 10 years.  HPV screening.** / Every 3 years from ages 30 through ages 65 or 70 with a history of 3 consecutive normal Pap tests. Testing can be stopped between 65 and 70 with 3 consecutive normal Pap tests and no abnormal Pap or HPV tests in the past 10 years.  Fecal occult blood test (FOBT) of stool. / Every year beginning at age 50 and continuing until age 75. You may not need to do this test if you get a colonoscopy every 10 years.  Flexible sigmoidoscopy or colonoscopy.** / Every 5 years for a flexible sigmoidoscopy or every 10 years for a colonoscopy beginning at age 50 and continuing until age 75.  Hepatitis C blood test.** / For all people born from 1945 through 1965 and any individual with known risks for hepatitis C.  Osteoporosis screening.** / A one-time screening for women ages 65 and over and women at risk for fractures or osteoporosis.  Skin self-exam. / Monthly.  Influenza immunization.** / Every year.  Pneumococcal polysaccharide immunization.** / 1 dose at age 65 (or older) if you have never been vaccinated.  Tetanus, diphtheria, pertussis (Tdap, Td) immunization. / A one-time dose of Tdap vaccine if you are over   65 and have contact with an infant, are a Research scientist (physical sciences), or simply want to be protected from whooping cough. After that, you need a Td booster dose every 10 years.  Varicella immunization.** / Consult your caregiver.  Meningococcal immunization.** / Consult your caregiver.  Hepatitis A immunization.** / Consult your caregiver. 2 doses, 6 to 18 months apart.  Hepatitis B immunization.** / Check with your caregiver. 3 doses, usually over 6 months. ** Family history and personal history of risk and conditions may change your caregiver's recommendations. Document Released: 05/19/2001 Document Revised: 06/15/2011  Document Reviewed: 08/18/2010 [facility] Patient Information 2013 [place], [place]. Hypoglycemia (Low Blood Sugar) Hypoglycemia is when the glucose (sugar) in your blood is too low. Hypoglycemia can happen for many reasons. It can happen to people with or without diabetes. Hypoglycemia can develop quickly and can be a medical emergency.  CAUSES  Having hypoglycemia does not mean that you will develop diabetes. Different causes include:  Missed or delayed meals or not enough carbohydrates eaten.  Medication overdose. This could be by accident or deliberate. If by accident, your medication may need to be adjusted or changed.  Exercise or increased activity without adjustments in carbohydrates or medications.  A nerve disorder that affects body functions like your heart rate, blood pressure and digestion (autonomic neuropathy).  A condition where the stomach muscles do not function properly (gastroparesis). Therefore, medications may not absorb properly.  The inability to recognize the signs of hypoglycemia (hypoglycemic unawareness).  Absorption of insulin  may be altered.  Alcohol consumption.  Pregnancy/menstrual cycles/postpartum. This may be due to hormones.  Certain kinds of tumors. This is very rare. SYMPTOMS   Sweating.  Hunger.  Dizziness.  Blurred vision.  Drowsiness.  Weakness.  Headache.  Rapid heart beat.  Shakiness.  Nervousness. DIAGNOSIS  Diagnosis is made by monitoring blood glucose in one or all of the following ways:  Fingerstick blood glucose monitoring.  Laboratory results. TREATMENT  If you think your blood glucose is low:  Check your blood glucose, if possible. If it is less than 70 mg/dl, take one of the following:  3-4 glucose tablets.   cup juice (prefer clear like apple).   cup "regular" soda pop.  1 cup milk.  -1 tube of glucose gel.  5-6 hard candies.  Do not over treat because your blood glucose (sugar) will only go too  high.  Wait 15 minutes and recheck your blood glucose. If it is still less than 70 mg/dl (or below your target range), repeat treatment.  Eat a snack if it is more than one hour until your next meal. Sometimes, your blood glucose may go so low that you are unable to treat yourself. You may need someone to help you. You may even pass out or be unable to swallow. This may require you to get an injection of glucagon, which raises the blood glucose. HOME CARE INSTRUCTIONS  Check blood glucose as recommended by your caregiver.  Take medication as prescribed by your caregiver.  Follow your meal plan. Do not skip meals. Eat on time.  If you are going to drink alcohol, drink it only with meals.  Check your blood glucose before driving.  Check your blood glucose before and after exercise. If you exercise longer or different than usual, be sure to check blood glucose more frequently.  Always carry treatment with you. Glucose tablets are the easiest to carry.  Always wear medical alert jewelry or carry some form of identification that states that  you have diabetes. This will alert people that you have diabetes. If you have hypoglycemia, they will have a better idea on what to do. SEEK MEDICAL CARE IF:   You are having problems keeping your blood sugar at target range.  You are having frequent episodes of hypoglycemia.  You feel you might be having side effects from your medicines.  You have symptoms of an illness that is not improving after 3-4 days.  You notice a change in vision or a new problem with your vision. SEEK IMMEDIATE MEDICAL CARE IF:   You are a family member or friend of a person whose blood glucose goes below 70 mg/dl and is accompanied by:  Confusion.  A change in mental status.  The inability to swallow.  Passing out. Document Released: 03/23/2005 Document Revised: 06/15/2011 Document Reviewed: 07/20/2011 [facility] Patient Information 2013 [place], [place].

## 2012-02-03 DIAGNOSIS — M545 Low back pain, unspecified: Secondary | ICD-10-CM | POA: Diagnosis not present

## 2012-02-03 DIAGNOSIS — M62838 Other muscle spasm: Secondary | ICD-10-CM | POA: Diagnosis not present

## 2012-02-03 DIAGNOSIS — IMO0002 Reserved for concepts with insufficient information to code with codable children: Secondary | ICD-10-CM | POA: Diagnosis not present

## 2012-02-05 DIAGNOSIS — R55 Syncope and collapse: Secondary | ICD-10-CM | POA: Diagnosis not present

## 2012-02-05 DIAGNOSIS — E039 Hypothyroidism, unspecified: Secondary | ICD-10-CM | POA: Diagnosis not present

## 2012-02-05 DIAGNOSIS — I4510 Unspecified right bundle-branch block: Secondary | ICD-10-CM | POA: Diagnosis not present

## 2012-02-11 DIAGNOSIS — M7989 Other specified soft tissue disorders: Secondary | ICD-10-CM | POA: Diagnosis not present

## 2012-02-11 DIAGNOSIS — M659 Synovitis and tenosynovitis, unspecified: Secondary | ICD-10-CM | POA: Diagnosis not present

## 2012-02-11 DIAGNOSIS — R609 Edema, unspecified: Secondary | ICD-10-CM | POA: Diagnosis not present

## 2012-02-11 DIAGNOSIS — M79609 Pain in unspecified limb: Secondary | ICD-10-CM | POA: Diagnosis not present

## 2012-02-15 DIAGNOSIS — R609 Edema, unspecified: Secondary | ICD-10-CM | POA: Diagnosis not present

## 2012-02-15 DIAGNOSIS — M79609 Pain in unspecified limb: Secondary | ICD-10-CM | POA: Diagnosis not present

## 2012-02-29 DIAGNOSIS — I83893 Varicose veins of bilateral lower extremities with other complications: Secondary | ICD-10-CM | POA: Diagnosis not present

## 2012-03-01 DIAGNOSIS — K5900 Constipation, unspecified: Secondary | ICD-10-CM | POA: Diagnosis not present

## 2012-03-01 DIAGNOSIS — R141 Gas pain: Secondary | ICD-10-CM | POA: Diagnosis not present

## 2012-03-01 DIAGNOSIS — R109 Unspecified abdominal pain: Secondary | ICD-10-CM | POA: Diagnosis not present

## 2012-03-01 DIAGNOSIS — Z800 Family history of malignant neoplasm of digestive organs: Secondary | ICD-10-CM | POA: Diagnosis not present

## 2012-03-21 ENCOUNTER — Ambulatory Visit: Payer: Medicare Other | Admitting: Family Medicine

## 2012-03-22 ENCOUNTER — Ambulatory Visit (INDEPENDENT_AMBULATORY_CARE_PROVIDER_SITE_OTHER): Payer: Medicare Other | Admitting: Family Medicine

## 2012-03-22 ENCOUNTER — Ambulatory Visit (HOSPITAL_BASED_OUTPATIENT_CLINIC_OR_DEPARTMENT_OTHER)
Admission: RE | Admit: 2012-03-22 | Discharge: 2012-03-22 | Disposition: A | Discharge status: Home / Self Care | Payer: Medicare Other | Source: Ambulatory Visit | Attending: Family Medicine | Admitting: Family Medicine

## 2012-03-22 ENCOUNTER — Encounter: Payer: Self-pay | Admitting: Family Medicine

## 2012-03-22 VITALS — BP 124/72 | HR 80 | Temp 98.0°F | Wt 145.0 lb

## 2012-03-22 DIAGNOSIS — R609 Edema, unspecified: Secondary | ICD-10-CM

## 2012-03-22 DIAGNOSIS — M7989 Other specified soft tissue disorders: Secondary | ICD-10-CM | POA: Diagnosis not present

## 2012-03-22 DIAGNOSIS — M79609 Pain in unspecified limb: Secondary | ICD-10-CM | POA: Diagnosis not present

## 2012-03-22 DIAGNOSIS — M79642 Pain in left hand: Secondary | ICD-10-CM

## 2012-03-22 DIAGNOSIS — M19049 Primary osteoarthritis, unspecified hand: Secondary | ICD-10-CM | POA: Diagnosis not present

## 2012-03-22 LAB — SEDIMENTATION RATE: Sed Rate: 13 mm/hr (ref 0–22)

## 2012-03-22 LAB — T4, FREE: Free T4: 1.58 ng/dL (ref 0.60–1.60)

## 2012-03-22 LAB — TSH: TSH: 0.19 u[IU]/mL — ABNORMAL LOW (ref 0.35–5.50)

## 2012-03-22 NOTE — Progress Notes (Signed)
  Subjective:    Patient ID: Alexandria [name], female    DOB: [birth year], 66 y.o.   MRN: [id number]  HPI Pt here c/o swelling 2 fingers L hand-- she woke up with them like that about 2 weeks ago.  + pain as well .   She also c/o weight loss but is seeing [facility] for that.    No other complaints, no known injury.   Review of Systems As above    Objective:   Physical Exam  BP 124/72  Pulse 80  Temp 98 F (36.7 C) (Oral)  Wt 145 lb (65.772 kg)  SpO2 97% General appearance: alert, cooperative, appears stated age and no distress Extremities: edema + edema 2nd and 3rd fingers L hand,  + tenderness with palpation      Assessment & Plan:

## 2012-03-22 NOTE — Assessment & Plan Note (Signed)
Check xray Check labs Consider referral

## 2012-03-22 NOTE — Patient Instructions (Addendum)
Edema Edema is an abnormal build-up of fluids in tissues. Because this is partly dependent on gravity (water flows to the lowest place), it is more common in the legs and thighs (lower extremities). It is also common in the looser tissues, like around the eyes. Painless swelling of the feet and ankles is common and increases as a person ages. It may affect both legs and may include the calves or even thighs. When squeezed, the fluid may move out of the affected area and may leave a dent for a few moments. CAUSES   Prolonged standing or sitting in one place for extended periods of time. Movement helps pump tissue fluid into the veins, and absence of movement prevents this, resulting in edema.  Varicose veins. The valves in the veins do not work as well as they should. This causes fluid to leak into the tissues.  Fluid and salt overload.  Injury, burn, or surgery to the leg, ankle, or foot, may damage veins and allow fluid to leak out.  Sunburn damages vessels. Leaky vessels allow fluid to go out into the sunburned tissues.  Allergies (from insect bites or stings, medications or chemicals) cause swelling by allowing vessels to become leaky.  Protein in the blood helps keep fluid in your vessels. Low protein, as in malnutrition, allows fluid to leak out.  Hormonal changes, including pregnancy and menstruation, cause fluid retention. This fluid may leak out of vessels and cause edema.  Medications that cause fluid retention. Examples are sex hormones, blood pressure medications, steroid treatment, or anti-depressants.  Some illnesses cause edema, especially heart failure, kidney disease, or liver disease.  Surgery that cuts veins or lymph nodes, such as surgery done for the heart or for breast cancer, may result in edema. DIAGNOSIS  Your caregiver is usually easily able to determine what is causing your swelling (edema) by simply asking what is wrong (getting a history) and examining you (doing  a physical). Sometimes x-rays, EKG (electrocardiogram or heart tracing), and blood work may be done to evaluate for underlying medical illness. TREATMENT  General treatment includes:  Leg elevation (or elevation of the affected body part).  Restriction of fluid intake.  Prevention of fluid overload.  Compression of the affected body part. Compression with elastic bandages or support stockings squeezes the tissues, preventing fluid from entering and forcing it back into the blood vessels.  Diuretics (also called water pills or fluid pills) pull fluid out of your body in the form of increased urination. These are effective in reducing the swelling, but can have side effects and must be used only under your caregiver's supervision. Diuretics are appropriate only for some types of edema. The specific treatment can be directed at any underlying causes discovered. Heart, liver, or kidney disease should be treated appropriately. HOME CARE INSTRUCTIONS   Elevate the legs (or affected body part) above the level of the heart, while lying down.  Avoid sitting or standing still for prolonged periods of time.  Avoid putting anything directly under the knees when lying down, and do not wear constricting clothing or garters on the upper legs.  Exercising the legs causes the fluid to work back into the veins and lymphatic channels. This may help the swelling go down.  The pressure applied by elastic bandages or support stockings can help reduce ankle swelling.  A low-salt diet may help reduce fluid retention and decrease the ankle swelling.  Take any medications exactly as prescribed. SEEK MEDICAL CARE IF:  Your edema is   not responding to recommended treatments. SEEK IMMEDIATE MEDICAL CARE IF:   You develop shortness of breath or chest pain.  You cannot breathe when you lay down; or if, while lying down, you have to get up and go to the window to get your breath.  You are having increasing  swelling without relief from treatment.  You develop a fever over 102 F (38.9 C).  You develop pain or redness in the areas that are swollen.  Tell your caregiver right away if you have gained 3 lb/1.4 kg in 1 day or 5 lb/2.3 kg in a week. MAKE SURE YOU:   Understand these instructions.  Will watch your condition.  Will get help right away if you are not doing well or get worse. Document Released: 03/23/2005 Document Revised: 09/22/2011 Document Reviewed: 11/09/2007 [employer] Patient Information 2013 [employer].  

## 2012-03-23 ENCOUNTER — Telehealth: Payer: Self-pay | Admitting: *Deleted

## 2012-03-23 LAB — ANA: Anti Nuclear Antibody(ANA): NEGATIVE

## 2012-03-23 LAB — RHEUMATOID FACTOR: Rhuematoid fact SerPl-aCnc: <10 IU/mL (ref <=14)

## 2012-03-24 ENCOUNTER — Telehealth: Payer: Self-pay | Admitting: *Deleted

## 2012-03-24 NOTE — Telephone Encounter (Signed)
If she is having calf pain-- she needs to be seen ---that was not mentioned during ov---- need to make sure she does not have a clot.

## 2012-03-24 NOTE — Telephone Encounter (Signed)
lmom to call the office     KP

## 2012-03-24 NOTE — Telephone Encounter (Signed)
Pt call to report that she forgot to mention that her left leg was swollen along with her 2 finger which she discuss at OV., Pt c/o swelling from knee down to feet is swollen with some reddish on side of foot along with some pain. Pt notes that these symptoms were discuss at OV note the foot pain Pt is to have surgery. Pt would also like to know results of labs. .Please advise

## 2012-03-25 MED ORDER — LEVOTHYROXINE SODIUM 125 MCG PO TABS: 137.0000 ug | ORAL_TABLET | Freq: Every day | ORAL | Status: DC

## 2012-03-25 NOTE — Telephone Encounter (Signed)
See labs 

## 2012-03-25 NOTE — Telephone Encounter (Signed)
Hyperthyroid--- Dec synthroid to 125 mcg #30 2 refills Recheck 2 months----Tsh 244.9 Rest of labs normal

## 2012-03-25 NOTE — Telephone Encounter (Signed)
Discussed with patient ans she voiced understanding, Rx faxed      KP

## 2012-03-25 NOTE — Telephone Encounter (Signed)
Pt denies any pain in calf tenderness to touch, warmth to touch or redness just swelling. Pt states that pain is in foot on the side and she is suppose to have surgery as discuss at OV along with finger swelling. Pt notes that on the 3rd she is going to have a vascular procedure done. Pt requesting results from labs..Please advise

## 2012-04-08 DIAGNOSIS — M7989 Other specified soft tissue disorders: Secondary | ICD-10-CM | POA: Diagnosis not present

## 2012-04-15 ENCOUNTER — Ambulatory Visit (INDEPENDENT_AMBULATORY_CARE_PROVIDER_SITE_OTHER): Payer: Medicare Other | Admitting: Family Medicine

## 2012-04-15 ENCOUNTER — Encounter: Payer: Self-pay | Admitting: Family Medicine

## 2012-04-15 VITALS — BP 152/83 | HR 75 | Temp 98.4°F | Ht 63.75 in | Wt 146.0 lb

## 2012-04-15 DIAGNOSIS — R109 Unspecified abdominal pain: Secondary | ICD-10-CM | POA: Diagnosis not present

## 2012-04-15 LAB — COMPREHENSIVE METABOLIC PANEL
ALT: 19 U/L (ref 0–35)
AST: 23 U/L (ref 0–37)
Albumin: 4 g/dL (ref 3.5–5.2)
Alkaline Phosphatase: 49 U/L (ref 39–117)
BUN: 8 mg/dL (ref 6–23)
Chloride: 100 mEq/L (ref 96–112)
Potassium: 3.6 mEq/L (ref 3.5–5.1)
Sodium: 139 mEq/L (ref 135–145)
Total Protein: 7.2 g/dL (ref 6.0–8.3)

## 2012-04-15 LAB — CBC WITH DIFFERENTIAL/PLATELET
Basophils Absolute: 0 10*3/uL (ref 0.0–0.1)
Eosinophils Absolute: 0.1 10*3/uL (ref 0.0–0.7)
Lymphocytes Relative: 36.5 % (ref 12.0–46.0)
MCHC: 34.3 g/dL (ref 30.0–36.0)
MCV: 95.6 fl (ref 78.0–100.0)
Monocytes Absolute: 0.6 10*3/uL (ref 0.1–1.0)
Neutrophils Relative %: 47.2 % (ref 43.0–77.0)
Platelets: 219 10*3/uL (ref 150.0–400.0)
RDW: 13.4 % (ref 11.5–14.6)

## 2012-04-15 MED ORDER — TRAMADOL HCL 50 MG PO TABS: ORAL_TABLET | ORAL | Status: DC

## 2012-04-15 NOTE — Progress Notes (Signed)
OFFICE NOTE  04/15/2012  CC:  Chief Complaint  Patient presents with  . Abdominal Pain    pain under left rib; takes breath away; diarrhea; history of divertic    HPI: Patient is a 67 y.o. Caucasian female who is here for acute worsening of chronic problem (15+ yrs) with stomach pains.  Last 2 d has had worsened abd pains, sometimes causing some dizziness.  Location is mostly upper abd, but also milder pains in lower abdomen.  Constant since yesterday, 10/10 last night, 5-7/10 currently. More BMs last 2d, loose, no pus or blood noted. NO fevers.  Nausea but no vomiting.  Appetite down.   She stopped her lactulose last night.  Bentyl helps to keep her BMs regular so if she stops this she fears she will have severe constipation.  No recent known sick contacts.   Nothing makes the pain worse and nothing makes the pain better.  Takes alleve 1 pill approx 1 time per week. No fevers.  No joint aches or swelling.  No oral ulcers.  No dysuria, no hematuria, no flank pain, no urinary frequency or urgency.    Her abdominal complaints have been evaluated by her PCP with pertinent blood tests and an abd/pelv CT scan--all unremarkable.  She is awaiting consultation with the GI dept at [facility].  Pertinent PMH:  Past Medical History  Diagnosis Date  . Arthritis   . Diverticulitis   . Migraines   . DVT (deep venous thrombosis)     lower extremity  . Colon polyp   . Thyroid disease   . TIA (transient ischemic attack)     8 years ago  . Heart attack     10 years ago  . Heart murmur    Past Surgical History  Procedure Date  . Vaginal hysterectomy 10/17/1998    Konrad Dolores  . Breast biopsy     Yolanda Bonine  . Total knee arthroplasty     Bilateral x's 2  . Neck surgery     x's 2    MEDS: **Pt not takng Levbid listed below** Outpatient Prescriptions Prior to Visit  Medication Sig Dispense Refill  . clopidogrel (PLAVIX) 75 MG tablet Take 1 tablet by mouth daily.      Marland Kitchen dicyclomine (BENTYL) 20 MG  tablet Take 4 tablets by mouth daily.      Marland Kitchen estradiol (ESTRACE) 2 MG tablet Take 1 tablet by mouth 3 x daily with food.      . furosemide (LASIX) 40 MG tablet Take 1 tablet by mouth daily.      Marland Kitchen KLOR-CON M20 20 MEQ tablet Take 1.5 tablets by mouth daily.      Marland Kitchen lactulose (CHRONULAC) 10 GM/15ML solution Take by mouth as needed.      Marland Kitchen levothyroxine (SYNTHROID) 125 MCG tablet Take 1 tablet (125 mcg total) by mouth daily.  30 tablet  2  . valsartan (DIOVAN) 80 MG tablet Take 40 mg by mouth daily.      Marland Kitchen BYSTOLIC 5 MG tablet Take 1 tablet by mouth daily.      Marland Kitchen CREON 24000 UNITS CPEP Take by mouth as needed.      . hyoscyamine (LEVBID) 0.375 MG 12 hr tablet       Last reviewed on 04/15/2012  1:16 PM by Jeoffrey Massed, MD  PE: Blood pressure 152/83, pulse 75, temperature 98.4 F (36.9 C), temperature source Temporal, height 5' 3.75" (1.619 m), weight 146 lb (66.225 kg), SpO2 97.00%. Gen: Alert, well  appearing.  Patient is oriented to person, place, time, and situation. ENT:  Eyes: no injection, icteris, swelling, or exudate.  EOMI, PERRLA. Nose: no drainage or turbinate edema/swelling.  No injection or focal lesion.  Mouth: lips without lesion/swelling.  Oral mucosa pink and moist.   Oropharynx without erythema, exudate, or swelling.  Neck - No masses or thyromegaly or limitation in range of motion CV: RRR, no m/r/g.   LUNGS: CTA bilat, nonlabored resps, good aeration in all lung fields. ABD: soft, nondistended.  Tender to palpation diffusely without guarding or rebound.  BS normal. Region of most significant tenderness is mid epigastric region.   IMPRESSION AND PLAN:  Recurrent abdominal pain Unclear etiology.  No red flags indicating any need for major change in the plan for future w/u via [facility] [place]. Check CMET, CBC, and lipase again today. Hold lactulose while having loose stools. Tramadol trial, 50mg , 1-2 tabs q6h prn pain, #30, no RF.   An After Visit Summary was printed  and given to the patient.  FOLLOW UP: prn

## 2012-04-17 DIAGNOSIS — R109 Unspecified abdominal pain: Secondary | ICD-10-CM | POA: Insufficient documentation

## 2012-04-17 NOTE — Assessment & Plan Note (Signed)
Unclear etiology.  No red flags indicating any need for major change in the plan for future w/u via [facility] [place]. Check CMET, CBC, and lipase again today. Hold lactulose while having loose stools. Tramadol trial, 50mg , 1-2 tabs q6h prn pain, #30, no RF.

## 2012-04-18 DIAGNOSIS — R141 Gas pain: Secondary | ICD-10-CM | POA: Diagnosis not present

## 2012-04-18 DIAGNOSIS — R142 Eructation: Secondary | ICD-10-CM | POA: Diagnosis not present

## 2012-04-18 DIAGNOSIS — R6881 Early satiety: Secondary | ICD-10-CM | POA: Diagnosis not present

## 2012-04-18 DIAGNOSIS — R109 Unspecified abdominal pain: Secondary | ICD-10-CM | POA: Diagnosis not present

## 2012-04-27 DIAGNOSIS — R197 Diarrhea, unspecified: Secondary | ICD-10-CM | POA: Diagnosis not present

## 2012-04-27 DIAGNOSIS — R141 Gas pain: Secondary | ICD-10-CM | POA: Diagnosis not present

## 2012-04-27 DIAGNOSIS — R109 Unspecified abdominal pain: Secondary | ICD-10-CM | POA: Diagnosis not present

## 2012-04-27 DIAGNOSIS — R142 Eructation: Secondary | ICD-10-CM | POA: Diagnosis not present

## 2012-05-09 DIAGNOSIS — Z1231 Encounter for screening mammogram for malignant neoplasm of breast: Secondary | ICD-10-CM | POA: Diagnosis not present

## 2012-05-09 DIAGNOSIS — Z803 Family history of malignant neoplasm of breast: Secondary | ICD-10-CM | POA: Diagnosis not present

## 2012-05-25 DIAGNOSIS — I83893 Varicose veins of bilateral lower extremities with other complications: Secondary | ICD-10-CM | POA: Diagnosis not present

## 2012-05-26 DIAGNOSIS — K5730 Diverticulosis of large intestine without perforation or abscess without bleeding: Secondary | ICD-10-CM | POA: Diagnosis not present

## 2012-05-26 DIAGNOSIS — Z888 Allergy status to other drugs, medicaments and biological substances status: Secondary | ICD-10-CM | POA: Diagnosis not present

## 2012-05-26 DIAGNOSIS — Z8673 Personal history of transient ischemic attack (TIA), and cerebral infarction without residual deficits: Secondary | ICD-10-CM | POA: Diagnosis not present

## 2012-05-26 DIAGNOSIS — I252 Old myocardial infarction: Secondary | ICD-10-CM | POA: Diagnosis not present

## 2012-05-26 DIAGNOSIS — E039 Hypothyroidism, unspecified: Secondary | ICD-10-CM | POA: Diagnosis not present

## 2012-05-26 DIAGNOSIS — Z800 Family history of malignant neoplasm of digestive organs: Secondary | ICD-10-CM | POA: Diagnosis not present

## 2012-05-26 DIAGNOSIS — K648 Other hemorrhoids: Secondary | ICD-10-CM | POA: Diagnosis not present

## 2012-05-26 DIAGNOSIS — Z7902 Long term (current) use of antithrombotics/antiplatelets: Secondary | ICD-10-CM | POA: Diagnosis not present

## 2012-05-26 DIAGNOSIS — Z1211 Encounter for screening for malignant neoplasm of colon: Secondary | ICD-10-CM | POA: Diagnosis not present

## 2012-05-26 DIAGNOSIS — K6389 Other specified diseases of intestine: Secondary | ICD-10-CM | POA: Diagnosis not present

## 2012-06-03 ENCOUNTER — Telehealth: Payer: Self-pay | Admitting: Family Medicine

## 2012-06-03 NOTE — Telephone Encounter (Signed)
Referrals want office notes pertaining to problem ---if we have not seen her for the problem we need to to put referral in

## 2012-06-03 NOTE — Telephone Encounter (Signed)
Please advise      KP 

## 2012-06-03 NOTE — Telephone Encounter (Signed)
Patient called, is also requesting a referral to be seen by a specialists for her toes.  Her 1st and 2nd toes on the left foot are curling over each other.  She had surgery for this in the past, but it did not work.  Will you refer?

## 2012-06-03 NOTE — Telephone Encounter (Signed)
msg left to call the office     KP 

## 2012-06-03 NOTE — Telephone Encounter (Signed)
Pt came in a gave [place] info:  She would like [place] to forward test results to  DR. Daphene Jaeger [facility] [place] Office:[phone] Faz:[phone] appt is Monday at 2:45.  She needs them called into results in before appt.

## 2012-06-03 NOTE — Telephone Encounter (Signed)
Pt came in but wanted to know status of referral for her.

## 2012-06-03 NOTE — Telephone Encounter (Signed)
Duplicate msg.

## 2012-06-06 DIAGNOSIS — R002 Palpitations: Secondary | ICD-10-CM | POA: Diagnosis not present

## 2012-06-06 DIAGNOSIS — R609 Edema, unspecified: Secondary | ICD-10-CM | POA: Diagnosis not present

## 2012-06-08 NOTE — Telephone Encounter (Signed)
Detailed msg left advising that an apt will be needed to complete a referral, and she will need to call the office back to schedule.    KP

## 2012-06-10 ENCOUNTER — Ambulatory Visit: Payer: Medicare Other | Admitting: Family Medicine

## 2012-06-13 ENCOUNTER — Ambulatory Visit (INDEPENDENT_AMBULATORY_CARE_PROVIDER_SITE_OTHER): Payer: Medicare Other | Admitting: Family Medicine

## 2012-06-13 ENCOUNTER — Encounter: Payer: Self-pay | Admitting: Family Medicine

## 2012-06-13 VITALS — BP 118/70 | HR 61 | Temp 98.4°F | Wt 148.0 lb

## 2012-06-13 DIAGNOSIS — M2042 Other hammer toe(s) (acquired), left foot: Secondary | ICD-10-CM

## 2012-06-13 DIAGNOSIS — M2041 Other hammer toe(s) (acquired), right foot: Secondary | ICD-10-CM

## 2012-06-13 DIAGNOSIS — M2040 Other hammer toe(s) (acquired), unspecified foot: Secondary | ICD-10-CM | POA: Diagnosis not present

## 2012-06-13 NOTE — Assessment & Plan Note (Signed)
Pt requesting referral to ortho.

## 2012-06-13 NOTE — Patient Instructions (Addendum)
Hammer Toes Hammer toes occur when the joint in one or more of your toes is permanently flexed. CAUSES  This happens when a muscle imbalance or abnormal bone length makes the small toes buckle under. This causes the toe joint to contract. This causes the tendons (cord like structure) to shorten.  SYMPTOMS   When hammer toes are flexible, you can straighten the buckled joint with your hand. Flexible hammer toes may develop into rigid hammer toes over time. Common symptoms of flexible hammer toes include:  Corns (build-ups of skin cells). Corns occur where boney bumps come in frequent contact with hard surfaces. For example, where your shoes press and rub.  Irritation.  Inflammation.  Pain.  Toe motion is limited.  When a rigid hammer toe is fixed you can no longer straighten the buckled joint. Corns, irritation, pain, and loss of motion is generally worse for rigid hammer toes than for flexible ones. TREATMENT  The problems noted above if painful or troublesome can be corrected with surgery. This is an elective surgery, so you can pick a convenient time for the procedure. The surgery may:  Improve appearance.  Relieve pain.  Improve function. You may be asked not to put weight on this foot for a few weeks. There are several types of surgical treatments. Common treatments are listed below. Your surgeon will discuss what is be best for you.   With arthroplasty, a portion of the joint is surgically removed and the toe is straightened. The "gap" fills in with fibroustissue. This helps with pain, deformity and function.  With fusion, cartilage between the two bones is taken out and the bones heal as one longer bone. This helps keep the toe stable and reduces pain but leaves the toe stiff, yet straight.  With implant, a portion of the bone is removed and replaced with an implant to restore motion.  Flexor tendon transfers may be used to release the deforming force which buckles the toe.  This is done by the repositioning of the tendons that curl the toes down (flexor tendons). Several of these options require fixing the toe with a pin that is visible at the tip of the toe. The pin keeps the toe straight during healing. It is generally removed in the office at 4-8 weeks after the corrective procedure. Generally, removing the pin is not painful.  LET YOUR CAREGIVER KNOW ABOUT:  Allergies.  Medications taken including herbs, eye drops, over the counter medications, and creams.  Use of steroids (by mouth or creams).  Previous problems with anesthetics or novocaine.  Possibility of pregnancy, if this applies.  History of blood clots (thrombophlebitis).  History of bleeding or blood problems.  Previous surgery.  Other health problems.  Family history of anesthetic problems. BEFORE THE PROCEDURE You should be present 60 minutes prior to your procedure unless otherwise directed by your caregiver.  RISKS AND COMPLICATIONS  If surgery is recommended, your caregiver will explain your foot problem and how surgery can improve it. Your caregiver can answer questions you may have about potential risks and complications involved.  Let your caregiver know about health changes prior to surgery. It is best to do elective surgeries when you are healthy. Be sure to ask your caregiver how long you will be off your feet and if you need to be off work. Plan accordingly. Your foot and ankle may be immobilized by a cast (from your toes to below your knee). You may be asked not to bear weight on this  foot for a few weeks. AFTER THE PROCEDURE You can bear weight as instructed. You may need a bandage, splint, and removable cast boot or surgical shoe for several weeks after surgery. You may resume normal diet and activities as directed. Only take over-the-counter or prescription medicines for pain, discomfort, or fever as directed by your caregiver. SEEK MEDICAL CARE IF:   You have increased  bleeding (more than a small spot) from the wound.  You notice redness, swelling, or increasing pain in the wound.  You notice pus coming from the wound or the pin that is used to stabilize the toe.  You notice a bad smell coming from the wound or dressing. SEEK IMMEDIATE MEDICAL CARE IF:   You have a fever.  You develop a rash.  You have difficulty breathing.  You have any allergic problems. Document Released: 03/20/2000 Document Revised: 06/15/2011 Document Reviewed: 04/20/2008 [facility] Patient Information 2013 [place], [place].

## 2012-06-13 NOTE — Progress Notes (Signed)
  Subjective:    Patient ID: Alexandria [name], female    DOB: December 25, 1945, 67 y.o.   MRN: [id number]  HPI Pt here requesting referral to foot Dr for hammer toes and prefers an ortho because she was not happy with podiatrist she saw in past. She has had surgery in past and is not happy with results.     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

## 2012-06-16 ENCOUNTER — Encounter: Payer: Self-pay | Admitting: Family Medicine

## 2012-06-21 DIAGNOSIS — R109 Unspecified abdominal pain: Secondary | ICD-10-CM | POA: Diagnosis not present

## 2012-06-21 DIAGNOSIS — K5900 Constipation, unspecified: Secondary | ICD-10-CM | POA: Diagnosis not present

## 2012-06-21 DIAGNOSIS — K3189 Other diseases of stomach and duodenum: Secondary | ICD-10-CM | POA: Diagnosis not present

## 2012-06-21 DIAGNOSIS — R141 Gas pain: Secondary | ICD-10-CM | POA: Diagnosis not present

## 2012-06-23 ENCOUNTER — Other Ambulatory Visit (INDEPENDENT_AMBULATORY_CARE_PROVIDER_SITE_OTHER): Payer: Medicare Other

## 2012-06-23 DIAGNOSIS — E039 Hypothyroidism, unspecified: Secondary | ICD-10-CM | POA: Diagnosis not present

## 2012-06-23 LAB — TSH: TSH: 0.8 u[IU]/mL (ref 0.35–5.50)

## 2012-06-24 ENCOUNTER — Encounter: Payer: Self-pay | Admitting: *Deleted

## 2012-06-27 ENCOUNTER — Telehealth: Payer: Self-pay | Admitting: Family Medicine

## 2012-06-27 MED ORDER — LEVOTHYROXINE SODIUM 125 MCG PO TABS: 137.0000 ug | ORAL_TABLET | Freq: Every day | ORAL | Status: DC

## 2012-06-27 NOTE — Telephone Encounter (Signed)
Rx sent 

## 2012-06-27 NOTE — Telephone Encounter (Signed)
Patient states that she has been out of thyroid medication since last Friday. Please send refill to [facility] across the [address]

## 2012-06-30 ENCOUNTER — Encounter: Payer: Self-pay | Admitting: Family Medicine

## 2012-06-30 ENCOUNTER — Ambulatory Visit (INDEPENDENT_AMBULATORY_CARE_PROVIDER_SITE_OTHER): Payer: Medicare Other | Admitting: Family Medicine

## 2012-06-30 VITALS — BP 120/78 | HR 70 | Temp 98.7°F | Wt 147.0 lb

## 2012-06-30 DIAGNOSIS — J9801 Acute bronchospasm: Secondary | ICD-10-CM

## 2012-06-30 DIAGNOSIS — J069 Acute upper respiratory infection, unspecified: Secondary | ICD-10-CM

## 2012-06-30 DIAGNOSIS — R55 Syncope and collapse: Secondary | ICD-10-CM

## 2012-06-30 MED ORDER — AZELASTINE-FLUTICASONE 137-50 MCG/ACT NA SUSP: 1.0000 | Freq: Two times a day (BID) | NASAL | Status: DC

## 2012-06-30 MED ORDER — ALBUTEROL SULFATE (2.5 MG/3ML) 0.083% IN NEBU
2.5000 mg | INHALATION_SOLUTION | Freq: Once | RESPIRATORY_TRACT | Status: AC
  Administered 2012-06-30: 2.5 mg via RESPIRATORY_TRACT

## 2012-06-30 MED ORDER — MOMETASONE FURO-FORMOTEROL FUM 100-5 MCG/ACT IN AERO: INHALATION_SPRAY | RESPIRATORY_TRACT | Status: DC

## 2012-06-30 NOTE — Progress Notes (Signed)
  Subjective:     Alexandria [name] is a 67 y.o. female here for evaluation of a cough. Onset of symptoms was 2 days ago. Symptoms have been gradually worsening since that time. The cough is productive and is aggravated by cold air, infection and reclining position. Associated symptoms include: shortness of breath, sputum production and wheezing. Patient does not have a history of asthma. Patient does not have a history of environmental allergens. Patient has not traveled recently. Patient does not have a history of smoking. Patient has not had a previous chest x-ray. Patient has not had a PPD done.  The following portions of the patient's history were reviewed and updated as appropriate: allergies, current medications, past family history, past medical history, past social history, past surgical history and problem list.  Review of Systems Pertinent items are noted in HPI.    Objective:    Oxygen saturation 97% on room air BP 120/78  Pulse 70  Temp(Src) 98.7 F (37.1 C) (Oral)  Wt 147 lb (66.679 kg)  BMI 25.44 kg/m2  SpO2 97% General appearance: alert, cooperative, appears stated age and no distress Ears: normal TM's and external ear canals both ears Nose: Nares normal. Septum midline. Mucosa normal. No drainage or sinus tenderness. Throat: lips, mucosa, and tongue normal; teeth and gums normal Neck: no adenopathy, supple, symmetrical, trachea midline and thyroid not enlarged, symmetric, no tenderness/mass/nodules Lungs: diminished breath sounds bilaterally    Assessment:    Reactive Airway Disease    Plan:    Explained lack of efficacy of antibiotics in viral disease. Antitussives per medication orders. Avoid exposure to tobacco smoke and fumes. B-agonist inhaler. Call if shortness of breath worsens, blood in sputum, change in character of cough, development of fever or chills, inability to maintain nutrition and hydration. Avoid exposure to tobacco smoke and fumes. Steroid  inhaler as ordered.

## 2012-06-30 NOTE — Patient Instructions (Addendum)
Bronchospasm A bronchospasm is when the tubes that carry air in and out of your lungs (bronchioles) become smaller. It is hard to breathe when this happens. A bronchospasm can be caused by:  Asthma.  Allergies.  Lung infection. HOME CARE   Do not  smoke. Avoid places that have secondhand smoke.  Dust your house often. Have your air ducts cleaned once or twice a year.  Find out what allergies may cause your bronchospasms.  Use your inhaler properly if you have one. Know when to use it.  Eat healthy foods and drink plenty of water.  Only take medicine as told by your doctor. GET HELP RIGHT AWAY IF:  You feel you cannot breathe or catch your breath.  You cannot stop coughing.  Your treatment is not helping you breathe better. MAKE SURE YOU:   Understand these instructions.  Will watch your condition.  Will get help right away if you are not doing well or get worse. Document Released: 01/18/2009 Document Revised: 06/15/2011 Document Reviewed: 01/18/2009 [employer] Patient Information 2013 [employer].  

## 2012-07-04 ENCOUNTER — Other Ambulatory Visit (INDEPENDENT_AMBULATORY_CARE_PROVIDER_SITE_OTHER): Payer: Medicare Other

## 2012-07-04 DIAGNOSIS — M2040 Other hammer toe(s) (acquired), unspecified foot: Secondary | ICD-10-CM | POA: Diagnosis not present

## 2012-07-04 DIAGNOSIS — R55 Syncope and collapse: Secondary | ICD-10-CM

## 2012-07-04 LAB — GLUCOSE TOLERANCE, 3 HOURS
Glucose, 1 Hour GTT: 114 mg/dL
Glucose, 2 hour: 107 mg/dL
Glucose, GTT - 3 Hour: 93 mg/dL

## 2012-07-12 ENCOUNTER — Telehealth: Payer: Self-pay | Admitting: Family Medicine

## 2012-07-12 DIAGNOSIS — E162 Hypoglycemia, unspecified: Secondary | ICD-10-CM

## 2012-07-12 NOTE — Telephone Encounter (Signed)
There is not another one at [facility]---- did she [facility] to [facility]---ok to put referral in

## 2012-07-12 NOTE — Telephone Encounter (Signed)
Patient states she discussed seeing a physician at [facility] when she saw last saw Dr. Laury Axon. She would like to know the status of this.

## 2012-07-12 NOTE — Telephone Encounter (Signed)
The patient wanted to see a nutritionist, Alexandria [name] has her setup with one but the apt is not unitl June 10th. Please advise    KP

## 2012-07-12 NOTE — Telephone Encounter (Signed)
i must have misunderstood her--- she is seeing a GI doctor at [facility].

## 2012-07-14 ENCOUNTER — Ambulatory Visit (INDEPENDENT_AMBULATORY_CARE_PROVIDER_SITE_OTHER): Payer: Medicare Other | Admitting: Family Medicine

## 2012-07-14 ENCOUNTER — Encounter: Payer: Self-pay | Admitting: Family Medicine

## 2012-07-14 VITALS — BP 120/78 | HR 76 | Temp 98.4°F | Ht 63.5 in | Wt 145.4 lb

## 2012-07-14 DIAGNOSIS — K5732 Diverticulitis of large intestine without perforation or abscess without bleeding: Secondary | ICD-10-CM | POA: Diagnosis not present

## 2012-07-14 DIAGNOSIS — K5792 Diverticulitis of intestine, part unspecified, without perforation or abscess without bleeding: Secondary | ICD-10-CM | POA: Insufficient documentation

## 2012-07-14 DIAGNOSIS — K5720 Diverticulitis of large intestine with perforation and abscess without bleeding: Secondary | ICD-10-CM | POA: Insufficient documentation

## 2012-07-14 MED ORDER — METRONIDAZOLE 500 MG PO TABS: 500.0000 mg | ORAL_TABLET | Freq: Three times a day (TID) | ORAL | Status: DC

## 2012-07-14 MED ORDER — CIPROFLOXACIN HCL 500 MG PO TABS: 500.0000 mg | ORAL_TABLET | Freq: Two times a day (BID) | ORAL | Status: DC

## 2012-07-14 NOTE — Assessment & Plan Note (Signed)
New to provider.  Pt's hx and current pain out of proportion to exam consistent w/ dx.  Start Cipro/Flagyl as directed.  Reviewed importance of liquid diet.  Reviewed supportive care and red flags that should prompt return.  Pt expressed understanding and is in agreement w/ plan.

## 2012-07-14 NOTE — Progress Notes (Signed)
  Subjective:    Patient ID: Alexandria [name], female    DOB: 1946-01-28, 67 y.o.   MRN: [id number]  HPI Diverticulitis- pt w/ hx of similar.  sxs started 2 days ago.  Pain is bandlike across lower abdomen.  + anorexia.  Taking sips of fluid.  No N/V, no change in bowel habits- hx of dumping syndrome.  No fevers, + chills.  Last bout of diverticulitis was 3-4 months ago.  No change in diet   Review of Systems For ROS see HPI     Objective:   Physical Exam  Vitals reviewed. Constitutional: She is oriented to person, place, and time. She appears well-developed and well-nourished.  Obviously uncomfortable  HENT:  MMM- no evidence of dehydration  Cardiovascular: Normal rate, regular rhythm and normal heart sounds.   Pulmonary/Chest: Effort normal and breath sounds normal. No respiratory distress. She has no wheezes. She has no rales.  Abdominal: Soft. Bowel sounds are normal. She exhibits no distension. There is tenderness. There is guarding (voluntary). There is no rebound.  Musculoskeletal: She exhibits no edema.  Neurological: She is alert and oriented to person, place, and time.  Skin: Skin is warm and dry.          Assessment & Plan:

## 2012-07-14 NOTE — Patient Instructions (Addendum)
Start the Cipro twice daily and the Flagyl 3x daily for 10 days Clear liquid diet until pain improves REST! Call if symptoms change or worsen Hang in there!

## 2012-07-28 ENCOUNTER — Ambulatory Visit: Payer: Medicare Other | Admitting: Family Medicine

## 2012-08-08 ENCOUNTER — Ambulatory Visit (INDEPENDENT_AMBULATORY_CARE_PROVIDER_SITE_OTHER): Payer: Medicare Other | Admitting: Family Medicine

## 2012-08-08 ENCOUNTER — Encounter: Payer: Self-pay | Admitting: Family Medicine

## 2012-08-08 VITALS — BP 132/84 | HR 75 | Temp 98.6°F | Wt 147.2 lb

## 2012-08-08 DIAGNOSIS — Z01810 Encounter for preprocedural cardiovascular examination: Secondary | ICD-10-CM | POA: Diagnosis not present

## 2012-08-08 NOTE — Progress Notes (Signed)
Subjective:    Alexandria [name] is a 67 y.o. female who presents to the office today for a preoperative consultation at the request of surgeon Victorino Dike who plans on performing surgery on L foot on t ba. This consultation is requested for the specific conditions prompting preoperative evaluation (i.e. because of potential affect on operative risk): cardiac. Planned anesthesia: general. The patient has the following known anesthesia issues: none. Patients bleeding risk: no recent abnormal bleeding. Patient does not have objections to receiving blood products if needed.  The following portions of the patient's history were reviewed and updated as appropriate:  She  has a past medical history of Arthritis; Diverticulitis; Migraines; DVT (deep venous thrombosis); Colon polyp; Thyroid disease; TIA (transient ischemic attack); Heart attack; and Heart murmur. She  does not have any pertinent problems on file. She  has past surgical history that includes Vaginal hysterectomy (10/17/1998); Breast biopsy; Total knee arthroplasty; and Neck surgery. Her family history includes Arthritis in an unspecified family member; Colon cancer in her brother and father; Diabetes in her maternal grandmother; HIV in her brother; Heart disease in her maternal grandmother; Hypertension in her maternal grandmother; Kidney cancer in her brother; Lung cancer in her brother; Other in her brother; Ovarian cancer in her mother; Prostate cancer in her father; Stroke in her maternal grandmother; and Uterine cancer in her mother. She  reports that she has never smoked. She has never used smokeless tobacco. She reports that she does not drink alcohol or use illicit drugs. She has a current medication list which includes the following prescription(s): azelastine-fluticasone, clopidogrel, dicyclomine, estradiol, furosemide, klor-con m20, lactulose, levothyroxine, mometasone-formoterol, tramadol, and valsartan. Current Outpatient  Prescriptions on File Prior to Visit  Medication Sig Dispense Refill  . Azelastine-Fluticasone (DYMISTA) 137-50 MCG/ACT SUSP Place 1 spray into the nose 2 (two) times daily.      . clopidogrel (PLAVIX) 75 MG tablet Take 1 tablet by mouth daily.      Marland Kitchen dicyclomine (BENTYL) 20 MG tablet Take 4 tablets by mouth daily.      Marland Kitchen estradiol (ESTRACE) 2 MG tablet Take 1 tablet by mouth 3 x daily with food.      . furosemide (LASIX) 40 MG tablet Take 1 tablet by mouth daily.      Marland Kitchen KLOR-CON M20 20 MEQ tablet Take 1.5 tablets by mouth daily.      Marland Kitchen lactulose (CHRONULAC) 10 GM/15ML solution Take by mouth as needed.      Marland Kitchen levothyroxine (SYNTHROID, LEVOTHROID) 125 MCG tablet Take 1 tablet (125 mcg total) by mouth daily.  30 tablet  2  . mometasone-formoterol (DULERA) 100-5 MCG/ACT AERO 2 puffs bid  1 Inhaler    . traMADol (ULTRAM) 50 MG tablet 1-2 tabs po q6h prn pain  30 tablet  0  . valsartan (DIOVAN) 80 MG tablet Take 40 mg by mouth daily.       No current facility-administered medications on file prior to visit.   She is allergic to prednisone..  Review of Systems Review of Systems  Constitutional: Negative for activity change, appetite change and fatigue.  HENT: Negative for hearing loss, congestion, tinnitus and ear discharge.  dentist q48m Eyes: Negative for visual disturbance (see optho q1y -- vision corrected to 20/20 with glasses).  Respiratory: Negative for cough, chest tightness and shortness of breath.   Cardiovascular: Negative for chest pain, palpitations and leg swelling.  Gastrointestinal: Negative for abdominal pain, diarrhea, constipation and abdominal distention.  Genitourinary: Negative for urgency, frequency, decreased  urine volume and difficulty urinating.  Musculoskeletal: Negative for back pain, arthralgias and gait problem.  Skin: Negative for color change, pallor and rash.  Neurological: Negative for dizziness, light-headedness, numbness and headaches.  Hematological:  Negative for adenopathy. Does not bruise/bleed easily.  Psychiatric/Behavioral: Negative for suicidal ideas, confusion, sleep disturbance, self-injury, dysphoric mood, decreased concentration and agitation.        Objective:    BP 132/84  Pulse 75  Temp(Src) 98.6 F (37 C) (Oral)  Wt 147 lb 3.2 oz (66.769 kg)  BMI 25.66 kg/m2  SpO2 97% General appearance: alert, cooperative, appears stated age and no distress Head: Normocephalic, without obvious abnormality, atraumatic Eyes: conjunctivae/corneas clear. PERRL, EOM's intact. Fundi benign. Ears: normal TM's and external ear canals both ears Nose: Nares normal. Septum midline. Mucosa normal. No drainage or sinus tenderness. Throat: lips, mucosa, and tongue normal; teeth and gums normal Neck: no adenopathy, no carotid bruit, no JVD, supple, symmetrical, trachea midline and thyroid not enlarged, symmetric, no tenderness/mass/nodules Back: symmetric, no curvature. ROM normal. No CVA tenderness. Lungs: clear to auscultation bilaterally Heart: S1, S2 normal Abdomen: soft, non-tender; bowel sounds normal; no masses,  no organomegaly Extremities: extremities normal, atraumatic, no cyanosis or edema Pulses: 2+ and symmetric Skin: Skin color, texture, turgor normal. No rashes or lesions Lymph nodes: Cervical, supraclavicular, and axillary nodes normal. Neurologic: Alert and oriented X 3, normal strength and tone. Normal symmetric reflexes. Normal coordination and gait  Predictors of intubation difficulty:  Morbid obesity? no  Anatomically abnormal facies? no  Prominent incisors? no  Receding mandible? no  Short, thick neck? no  Neck range of motion: normal       Dentition: new implants  Cardiographics ECG: no acute changes---sinus brady Echocardiogram: not done  Imaging Chest x-ray: not done   Lab Review  Appointment on 07/04/2012  Component Date Value  . Glucose, Fasting 07/04/2012 92   . Glucose, 1 Hour GTT 07/04/2012 114    . Glucose, 2 hour 07/04/2012 107   . Glucose, GTT - 3 Hour 07/04/2012 93   Lab on 06/23/2012  Component Date Value  . TSH 06/23/2012 0.80   Office Visit on 04/15/2012  Component Date Value  . WBC 04/15/2012 4.6   . RBC 04/15/2012 4.14   . Hemoglobin 04/15/2012 13.6   . HCT 04/15/2012 39.6   . MCV 04/15/2012 95.6   . MCHC 04/15/2012 34.3   . RDW 04/15/2012 13.4   . Platelets 04/15/2012 219.0   . Neutrophils Relative 04/15/2012 47.2   . Lymphocytes Relative 04/15/2012 36.5   . Monocytes Relative 04/15/2012 13.8*  . Eosinophils Relative 04/15/2012 2.0   . Basophils Relative 04/15/2012 0.5   . Neutro Abs 04/15/2012 2.2   . Lymphs Abs 04/15/2012 1.7   . Monocytes Absolute 04/15/2012 0.6   . Eosinophils Absolute 04/15/2012 0.1   . Basophils Absolute 04/15/2012 0.0   . Sodium 04/15/2012 139   . Potassium 04/15/2012 3.6   . Chloride 04/15/2012 100   . CO2 04/15/2012 33*  . Glucose, Bld 04/15/2012 81   . BUN 04/15/2012 8   . Creatinine, Ser 04/15/2012 0.7   . Total Bilirubin 04/15/2012 0.5   . Alkaline Phosphatase 04/15/2012 49   . AST 04/15/2012 23   . ALT 04/15/2012 19   . Total Protein 04/15/2012 7.2   . Albumin 04/15/2012 4.0   . Calcium 04/15/2012 9.4   . GFR 04/15/2012 83.12   . Lipase 04/15/2012 21.0   Office Visit on 03/22/2012  Component Date  Value  . T3, Free 03/22/2012 2.5   . Free T4 03/22/2012 1.58   . TSH 03/22/2012 0.19*  . ANA 03/22/2012 NEG   . Rheumatoid Factor 03/22/2012 <10   . Sed Rate 03/22/2012 13       Assessment:      67 y.o. female with planned surgery as above.   Known risk factors for perioperative complications: None   Difficulty with intubation is not anticipated.  Cardiac Risk Estimation: low  Current medications which may produce withdrawal symptoms if withheld perioperatively: na    Plan:    1. Preoperative workup as follows per surgery and anesthesia. 2. Change in medication regimen before surgery: per surgeon. 3.  Prophylaxis for cardiac events with perioperative beta-blockers: should be considered, specific regimen per anesthesia. 4. Invasive hemodynamic monitoring perioperatively: at the discretion of anesthesiologist. 5. Deep vein thrombosis prophylaxis postoperatively:regimen to be chosen by surgical team. 6. Surveillance for postoperative MI with ECG immediately postoperatively and on postoperative days 1 and 2 AND troponin levels 24 hours postoperatively and on day 4 or hospital discharge (whichever comes first): at the discretion of anesthesiologist. 7. Other measures: consult [facility]

## 2012-08-09 ENCOUNTER — Encounter: Payer: Medicare Other | Attending: Family Medicine | Admitting: *Deleted

## 2012-08-09 DIAGNOSIS — E162 Hypoglycemia, unspecified: Secondary | ICD-10-CM | POA: Diagnosis not present

## 2012-08-09 DIAGNOSIS — K5792 Diverticulitis of intestine, part unspecified, without perforation or abscess without bleeding: Secondary | ICD-10-CM

## 2012-08-09 DIAGNOSIS — K911 Postgastric surgery syndromes: Secondary | ICD-10-CM | POA: Insufficient documentation

## 2012-08-09 DIAGNOSIS — K5900 Constipation, unspecified: Secondary | ICD-10-CM | POA: Diagnosis not present

## 2012-08-09 DIAGNOSIS — Z713 Dietary counseling and surveillance: Secondary | ICD-10-CM | POA: Diagnosis not present

## 2012-08-09 DIAGNOSIS — K5732 Diverticulitis of large intestine without perforation or abscess without bleeding: Secondary | ICD-10-CM | POA: Diagnosis not present

## 2012-08-09 NOTE — Patient Instructions (Addendum)
Aim for 5-6 eating occasions- aim for 300-350 calories; about every 3-4 hours  Breakfast time: coffee or hot water with lemon; scrambled egg with potato bread Snack: liquid shake L: cheese and matzo, spelt, potato bread; Boca patty with spinach D: fish or chicken, steak, veal,; white rice, barley S: goat cheese with fig jelly.  Or soymilk.  Or grapes/canteloupe  Aim for walking or gardening every day

## 2012-08-09 NOTE — Progress Notes (Signed)
  Medical Nutrition Therapy:  Appt start time: 1600 end time:  1700.   Assessment:  Primary concerns today: Daionna is here for nutrition counseling pertaining hypoglycemic episodes confounded by multipple GI issues.  Has problems with dumping syndrome.  Also suffers from diverticulitis.  She complains of feeling full all the time.  When the food "dumps", she feels like she's hypoglycemic.  Has problems with constipation.  Has 2 artificial knees and can't do much lower body activity.  She is phobic about germs and refuses water exercises  MEDICATIONS: see list   DIETARY INTAKE:  Usual eating pattern is veyr irregular.  She skips meals often or eats very little.  Her metabolism has decreased and now she doesn't feel hungry.    Everyday foods include scrambled eggs.  Avoided foods include nut butters (makes her feels like she's going to be sick), bread, corn, nuts/seeds. Yogurts, fruit, gluten.  She is very picky   24-hr recall:  B ( AM): sometimes sauteed asparagus and tomatoes with onion.  Might just have coffee or water or hot water and lemon Snk ( AM): maybe scrambled egg if at home  L ( PM): maybe coffee Snk ( PM): not usually D ( PM): around 4 pm- baked chicken or fish; homemade pasta with meatballs Snk ( PM): not usually Beverages: coffee, water  Usual physical activity: piliates in the morning, curves for weights 3 times.  Walks in the evenings some times.  Also does the row machine at gym  Estimated energy needs: 1500 calories 170 g carbohydrates 112 g protein 42 g fat  Progress Towards Goal(s):  In progress.   Nutritional Diagnosis:  McBride-1.4 Altered GI function As related to dumping syndrome and diverticulosis.  As evidenced by dietary recall and patient self-report.    Intervention:  Nutrition counseling provided.  Recommended 5-6 small eating occasions in the neighborhood of 300-400 calories.  Encouraged protein and carbohydrates with each "meal."  Suggested  Boost/Ensure as a supplement in the event that solid foods increase satiety.  Recommended daily activity to ease digestion.  Suggested edamame as quick food when she's on the risers singing or gluten-free crackers.  B: coffee or hot water with lemon; scrambled egg with potato bread S: liquid shake L: cheese and matzo, spelt, potato bread; Boca patty with spinach D: fish or chicken, steak, veal,; white rice, barley S: goat cheese with fig jelly.  Or soymilk.  Or grapes/canteloupe  Monitoring/Evaluation:  Dietary intake, exercise, and body weight prn.

## 2012-08-11 ENCOUNTER — Ambulatory Visit: Payer: Medicare Other | Admitting: *Deleted

## 2012-08-17 DIAGNOSIS — Z130 Encounter for screening for diseases of the blood and blood-forming organs and certain disorders involving the immune mechanism: Secondary | ICD-10-CM | POA: Diagnosis not present

## 2012-08-17 DIAGNOSIS — N760 Acute vaginitis: Secondary | ICD-10-CM | POA: Diagnosis not present

## 2012-08-17 DIAGNOSIS — Z124 Encounter for screening for malignant neoplasm of cervix: Secondary | ICD-10-CM | POA: Diagnosis not present

## 2012-08-18 DIAGNOSIS — K5732 Diverticulitis of large intestine without perforation or abscess without bleeding: Secondary | ICD-10-CM | POA: Diagnosis not present

## 2012-08-31 ENCOUNTER — Ambulatory Visit: Payer: Medicare Other | Admitting: *Deleted

## 2012-09-07 ENCOUNTER — Other Ambulatory Visit: Payer: Self-pay | Admitting: Family Medicine

## 2012-09-07 DIAGNOSIS — M2040 Other hammer toe(s) (acquired), unspecified foot: Secondary | ICD-10-CM | POA: Diagnosis not present

## 2012-09-07 DIAGNOSIS — M2010 Hallux valgus (acquired), unspecified foot: Secondary | ICD-10-CM | POA: Diagnosis not present

## 2012-09-19 ENCOUNTER — Telehealth: Payer: Self-pay | Admitting: Family Medicine

## 2012-09-19 MED ORDER — LACTULOSE 10 GM/15ML PO SOLN: 30.0000 mL | Freq: Four times a day (QID) | ORAL | Status: DC | PRN

## 2012-09-19 NOTE — Telephone Encounter (Signed)
Rx sent, patient has been made aware.     KP

## 2012-09-19 NOTE — Telephone Encounter (Signed)
Patient walked in stating that a physician at [facility] originally prescribed her Lactulose. She tried getting refill from that physician but he told her to call here. Patient states that she is having surgery tomorrow and knows that this medication will help her.  Lactulose 10gm/17ml solution. Take 2 tablespoons(71ml) every 6 hrs as needed. [facility] [address]  Best # to reach patient at is [phone]

## 2012-09-19 NOTE — Telephone Encounter (Signed)
Refill x1 

## 2012-09-19 NOTE — Telephone Encounter (Signed)
Please advise      KP 

## 2012-09-20 ENCOUNTER — Other Ambulatory Visit: Payer: Self-pay | Admitting: Family Medicine

## 2012-09-20 DIAGNOSIS — M948X9 Other specified disorders of cartilage, unspecified sites: Secondary | ICD-10-CM | POA: Diagnosis not present

## 2012-09-20 DIAGNOSIS — G8918 Other acute postprocedural pain: Secondary | ICD-10-CM | POA: Diagnosis not present

## 2012-09-20 DIAGNOSIS — M2010 Hallux valgus (acquired), unspecified foot: Secondary | ICD-10-CM | POA: Diagnosis not present

## 2012-09-20 DIAGNOSIS — M2040 Other hammer toe(s) (acquired), unspecified foot: Secondary | ICD-10-CM | POA: Diagnosis not present

## 2012-09-27 DIAGNOSIS — Z4789 Encounter for other orthopedic aftercare: Secondary | ICD-10-CM | POA: Diagnosis not present

## 2012-09-28 ENCOUNTER — Ambulatory Visit (INDEPENDENT_AMBULATORY_CARE_PROVIDER_SITE_OTHER): Payer: Medicare Other | Admitting: Family Medicine

## 2012-09-28 ENCOUNTER — Encounter: Payer: Self-pay | Admitting: Family Medicine

## 2012-09-28 VITALS — BP 120/80 | HR 62 | Temp 97.8°F | Ht 63.5 in

## 2012-09-28 DIAGNOSIS — R509 Fever, unspecified: Secondary | ICD-10-CM

## 2012-09-28 LAB — CBC WITH DIFFERENTIAL/PLATELET
Basophils Relative: 1.1 % (ref 0.0–3.0)
Eosinophils Relative: 5.3 % — ABNORMAL HIGH (ref 0.0–5.0)
Hemoglobin: 12.7 g/dL (ref 12.0–15.0)
Lymphocytes Relative: 45.4 % (ref 12.0–46.0)
MCHC: 33.7 g/dL (ref 30.0–36.0)
Monocytes Relative: 15.8 % — ABNORMAL HIGH (ref 3.0–12.0)
Neutro Abs: 1.4 10*3/uL (ref 1.4–7.7)
RBC: 3.82 Mil/uL — ABNORMAL LOW (ref 3.87–5.11)
WBC: 4.4 10*3/uL — ABNORMAL LOW (ref 4.5–10.5)

## 2012-09-28 NOTE — Patient Instructions (Addendum)
We'll notify you of your lab results and start antibiotics Drink plenty of fluids REST! Alternate tylenol and ibuprofen for pain/fever Call with any questions or concerns Hang in there!!!

## 2012-09-28 NOTE — Assessment & Plan Note (Addendum)
New.  Subjective fever.  No documentation of temp.  Pt otherwise feeling well.  No temp in office.  No evidence of infxn on PE.  Suspect viral illness or stress response from surgery.  Check labs to r/o elevated WBC.  If high, will start abx.  Pt expressed understanding and is in agreement w/ plan.

## 2012-09-28 NOTE — Progress Notes (Signed)
  Subjective:    Patient ID: Alexandria [name], female    DOB: 1945-12-22, 67 y.o.   MRN: [id number]  HPI Chills- pt had recent surgery on L foot, has pins in place.  Has bilateral TKRs.  Saw surgeon yesterday and reported that she is having chills and sweats.  Was told to see PCP and take benadryl.  sxs started 4 days ago, surgery was 8 days ago.  No thermometer at house.  Denies HA, nausea, diarrhea.  'i threw up in my mouth this AM'.  No cough.  No known sick contacts.  Ongoing abdominal pain but no worse than usual.   Review of Systems For ROS see HPI     Objective:   Physical Exam  Vitals reviewed. Constitutional: She is oriented to person, place, and time. She appears well-developed and well-nourished. No distress.  HENT:  Head: Normocephalic and atraumatic.  Nose: Nose normal.  Mouth/Throat: Oropharynx is clear and moist. No oropharyngeal exudate.  Neck: Normal range of motion. Neck supple.  Cardiovascular: Normal rate, regular rhythm and normal heart sounds.   Pulmonary/Chest: Effort normal and breath sounds normal. No respiratory distress. She has no wheezes. She has no rales.  Musculoskeletal: She exhibits no edema.  L foot in post-op shoe w/ pins in 2nd and 3rd toes.  No redness or drainage visible  Lymphadenopathy:    She has no cervical adenopathy.  Neurological: She is alert and oriented to person, place, and time.  Skin: Skin is warm and dry.          Assessment & Plan:

## 2012-10-06 DIAGNOSIS — Z4789 Encounter for other orthopedic aftercare: Secondary | ICD-10-CM | POA: Diagnosis not present

## 2012-10-19 DIAGNOSIS — Z4789 Encounter for other orthopedic aftercare: Secondary | ICD-10-CM | POA: Diagnosis not present

## 2012-10-27 ENCOUNTER — Encounter: Payer: Self-pay | Admitting: Internal Medicine

## 2012-10-27 ENCOUNTER — Ambulatory Visit (INDEPENDENT_AMBULATORY_CARE_PROVIDER_SITE_OTHER): Payer: Medicare Other | Admitting: Internal Medicine

## 2012-10-27 VITALS — BP 116/74 | HR 68 | Temp 98.2°F

## 2012-10-27 DIAGNOSIS — J069 Acute upper respiratory infection, unspecified: Secondary | ICD-10-CM | POA: Diagnosis not present

## 2012-10-27 MED ORDER — AMOXICILLIN 500 MG PO CAPS: 500.0000 mg | ORAL_CAPSULE | Freq: Three times a day (TID) | ORAL | Status: DC

## 2012-10-27 NOTE — Progress Notes (Signed)
  Subjective:    Patient ID: Alexandria [name], female    DOB: 02-Dec-1945, 67 y.o.   MRN: [id number]  HPI   Symptoms began 10/26/12 as pain in the frontal and facial sinuses. This was associated with aching of her eyes.  Last night she felt chilled and had sweats  She's had minor sore throat.  She also describes a dry cough  Aleve did not  help  She denies a history of smoking; significant extrinsic allergies; or frank asthma.    Review of Systems  She denies definite fever, nasal purulence, dental pain, otic pain, or otic discharge.  She's had no associated shortness of breath or wheezing  She denies extrinsic symptoms of itchy, watery eyes or sneezing.  The aching in the eyes has not been associated with discharge , blurred vision, double vision, or loss of vision.     Objective:   Physical Exam General appearance:good health ;well nourished; no acute distress or increased work of breathing is present.  No  lymphadenopathy about the head, neck, or axilla noted.   Eyes: No conjunctival inflammation or lid edema is present.EOMI ; vision normal  Ears:  External ear exam shows no significant lesions or deformities.  Otoscopic examination reveals clear canals, tympanic membranes are intact bilaterally without bulging, retraction, inflammation or discharge.  Nose:  External nasal examination shows no deformity or inflammation. Nasal mucosa are pink and moist without lesions or exudates. No septal dislocation or deviation.No obstruction to airflow.   Oral exam: Dental hygiene is good; lips and gums are healthy appearing.There is no oropharyngeal erythema or exudate noted.   Neck:  No deformities, masses, or tenderness noted.      Heart:  Normal rate and regular rhythm. S1 and S2 normal without gallop,  click, rub or other extra sounds.  Grade 1/6 systolic murmur  Lungs:Chest clear to auscultation; no wheezes, rhonchi,rales ,or rubs present.No increased work of breathing.     Extremities:  No cyanosis, edema, or clubbing  noted . Walking shoe L foot   Skin: Warm & dry .         Assessment & Plan:  #1 acute  URI Plan: See orders and recommendations

## 2012-10-27 NOTE — Patient Instructions (Addendum)
Plain Mucinex (NOT D) for thick secretions ;force NON dairy fluids .   Nasal cleansing in the shower as discussed with lather of mild shampoo.After 10 seconds wash off lather while  exhaling through nostrils. Make sure that all residual soap is removed to prevent irritation.  Nasal spray from Dr Laury Axon 1 spray in each nostril twice a day as needed. Use the "crossover" technique into opposite nostril spraying toward opposite ear @ 45 degree angle, not straight up into nostril.  Use a Neti pot daily only  as needed for significant sinus congestion; going from open side to congested side . Plain Allegra (NOT D )  160 daily , Loratidine 10 mg , OR Zyrtec 10 mg @ bedtime  as needed for itchy eyes & sneezing.

## 2012-10-28 ENCOUNTER — Other Ambulatory Visit: Payer: Self-pay | Admitting: *Deleted

## 2012-10-28 DIAGNOSIS — K5732 Diverticulitis of large intestine without perforation or abscess without bleeding: Secondary | ICD-10-CM

## 2012-10-28 MED ORDER — LACTULOSE 10 GM/15ML PO SOLN: ORAL | Status: DC

## 2012-10-28 NOTE — Telephone Encounter (Signed)
[facility] [place]

## 2012-11-02 DIAGNOSIS — Z4789 Encounter for other orthopedic aftercare: Secondary | ICD-10-CM | POA: Diagnosis not present

## 2012-11-25 ENCOUNTER — Ambulatory Visit (INDEPENDENT_AMBULATORY_CARE_PROVIDER_SITE_OTHER): Payer: Medicare Other | Admitting: Family Medicine

## 2012-11-25 ENCOUNTER — Encounter: Payer: Self-pay | Admitting: Family Medicine

## 2012-11-25 ENCOUNTER — Telehealth: Payer: Self-pay

## 2012-11-25 VITALS — BP 130/82 | HR 72 | Temp 98.7°F

## 2012-11-25 DIAGNOSIS — L659 Nonscarring hair loss, unspecified: Secondary | ICD-10-CM | POA: Insufficient documentation

## 2012-11-25 DIAGNOSIS — L650 Telogen effluvium: Secondary | ICD-10-CM | POA: Diagnosis not present

## 2012-11-25 DIAGNOSIS — R252 Cramp and spasm: Secondary | ICD-10-CM | POA: Diagnosis not present

## 2012-11-25 DIAGNOSIS — E039 Hypothyroidism, unspecified: Secondary | ICD-10-CM | POA: Diagnosis not present

## 2012-11-25 LAB — BASIC METABOLIC PANEL
CO2: 30 mEq/L (ref 19–32)
Chloride: 104 mEq/L (ref 96–112)
Potassium: 3.3 mEq/L — ABNORMAL LOW (ref 3.5–5.1)
Sodium: 140 mEq/L (ref 135–145)

## 2012-11-25 LAB — T4, FREE: Free T4: 1.26 ng/dL (ref 0.60–1.60)

## 2012-11-25 LAB — T3, FREE: T3, Free: 2.5 pg/mL (ref 2.3–4.2)

## 2012-11-25 NOTE — Telephone Encounter (Signed)
Patient returning missed call. 

## 2012-11-25 NOTE — Progress Notes (Signed)
  Subjective:    Patient ID: LYLA JASEK, female    DOB: [birth year], 67 y.o.   MRN: [id number]  HPI Pt here c/o losing hair and nails breaking.  She is requesting her thyroid be checked.  This has occurred to her before when her tsh was abnormal. No other complaints.     Review of Systems    as above Objective:   Physical Exam BP 130/82  Pulse 72  Temp(Src) 98.7 F (37.1 C) (Oral)  SpO2 98% General appearance: alert, cooperative, appears stated age and no distress Head: thinning hair Extremities: brittle nails        Assessment & Plan:

## 2012-11-25 NOTE — Assessment & Plan Note (Signed)
With brittle nails Check labs rto prn

## 2012-11-25 NOTE — Patient Instructions (Signed)

## 2012-11-25 NOTE — Telephone Encounter (Signed)
i would have added blood work.   If it cont to be a problem---rto and we will investigate this.

## 2012-11-25 NOTE — Telephone Encounter (Signed)
Msg left to call the office     KP 

## 2012-11-25 NOTE — Telephone Encounter (Signed)
Patient was leaving the office and stated she forgot to tell you that she has been bruising a lot lately and not really sure why. She wasn't sure what you suggest. Please advise      KP

## 2012-11-28 ENCOUNTER — Telehealth: Payer: Self-pay | Admitting: Family Medicine

## 2012-11-28 MED ORDER — POTASSIUM CHLORIDE CRYS ER 20 MEQ PO TBCR: 40.0000 meq | EXTENDED_RELEASE_TABLET | Freq: Every day | ORAL | Status: DC

## 2012-11-28 NOTE — Telephone Encounter (Signed)
Patient is returning missed call.

## 2012-11-28 NOTE — Telephone Encounter (Signed)
Patient aware and voiced understanding.      KP 

## 2012-11-28 NOTE — Telephone Encounter (Signed)
Thyroid normal--con't current dose  K low--- inc to kcl 20 meq 2 a day  Recheck 1 month---bmp Dx hypokalemia

## 2012-11-28 NOTE — Telephone Encounter (Signed)
Patient is calling requesting to speak with Selena Batten about her blood work. Spoke with her this morning but has additional questions.

## 2012-11-28 NOTE — Telephone Encounter (Signed)
Discussed with patient and she voiced understanding. Rx sent and copy mailed       KP 

## 2012-12-07 ENCOUNTER — Other Ambulatory Visit: Payer: Self-pay | Admitting: Family Medicine

## 2012-12-07 DIAGNOSIS — Z4789 Encounter for other orthopedic aftercare: Secondary | ICD-10-CM | POA: Diagnosis not present

## 2012-12-08 NOTE — Telephone Encounter (Signed)
Last filled 10/28/12. Pease advise if this refill is appropriate.      KP

## 2012-12-13 ENCOUNTER — Ambulatory Visit: Payer: Medicare Other | Admitting: Family Medicine

## 2012-12-13 DIAGNOSIS — M25579 Pain in unspecified ankle and joints of unspecified foot: Secondary | ICD-10-CM | POA: Diagnosis not present

## 2012-12-16 ENCOUNTER — Encounter: Payer: Self-pay | Admitting: Family Medicine

## 2012-12-16 ENCOUNTER — Ambulatory Visit (INDEPENDENT_AMBULATORY_CARE_PROVIDER_SITE_OTHER): Payer: Medicare Other | Admitting: Family Medicine

## 2012-12-16 VITALS — BP 110/70 | HR 57 | Temp 98.3°F

## 2012-12-16 DIAGNOSIS — J069 Acute upper respiratory infection, unspecified: Secondary | ICD-10-CM | POA: Diagnosis not present

## 2012-12-16 DIAGNOSIS — H60399 Other infective otitis externa, unspecified ear: Secondary | ICD-10-CM

## 2012-12-16 DIAGNOSIS — H60392 Other infective otitis externa, left ear: Secondary | ICD-10-CM

## 2012-12-16 MED ORDER — CEFUROXIME AXETIL 500 MG PO TABS: 500.0000 mg | ORAL_TABLET | Freq: Two times a day (BID) | ORAL | Status: AC

## 2012-12-16 MED ORDER — AZELASTINE-FLUTICASONE 137-50 MCG/ACT NA SUSP: 1.0000 | Freq: Two times a day (BID) | NASAL | Status: DC

## 2012-12-16 MED ORDER — OFLOXACIN 0.3 % OT SOLN: 10.0000 [drp] | Freq: Every day | OTIC | Status: DC

## 2012-12-16 NOTE — Patient Instructions (Signed)
Otitis Externa Otitis externa is a bacterial or fungal infection of the outer ear canal. This is the area from the eardrum to the outside of the ear. Otitis externa is sometimes called "swimmer's ear." CAUSES  Possible causes of infection include:  Swimming in dirty water.  Moisture remaining in the ear after swimming or bathing.  Mild injury (trauma) to the ear.  Objects stuck in the ear (foreign body).  Cuts or scrapes (abrasions) on the outside of the ear. SYMPTOMS  The first symptom of infection is often itching in the ear canal. Later signs and symptoms may include swelling and redness of the ear canal, ear pain, and yellowish-white fluid (pus) coming from the ear. The ear pain may be worse when pulling on the earlobe. DIAGNOSIS  Your caregiver will perform a physical exam. A sample of fluid may be taken from the ear and examined for bacteria or fungi. TREATMENT  Antibiotic ear drops are often given for 10 to 14 days. Treatment may also include pain medicine or corticosteroids to reduce itching and swelling. PREVENTION   Keep your ear dry. Use the corner of a towel to absorb water out of the ear canal after swimming or bathing.  Avoid scratching or putting objects inside your ear. This can damage the ear canal or remove the protective wax that lines the canal. This makes it easier for bacteria and fungi to grow.  Avoid swimming in lakes, polluted water, or poorly chlorinated pools.  You may use ear drops made of rubbing alcohol and vinegar after swimming. Combine equal parts of white vinegar and alcohol in a bottle. Put 3 or 4 drops into each ear after swimming. HOME CARE INSTRUCTIONS   Apply antibiotic ear drops to the ear canal as prescribed by your caregiver.  Only take over-the-counter or prescription medicines for pain, discomfort, or fever as directed by your caregiver.  If you have diabetes, follow any additional treatment instructions from your caregiver.  Keep all  follow-up appointments as directed by your caregiver. SEEK MEDICAL CARE IF:   You have a fever.  Your ear is still red, swollen, painful, or draining pus after 3 days.  Your redness, swelling, or pain gets worse.  You have a severe headache.  You have redness, swelling, pain, or tenderness in the area behind your ear. MAKE SURE YOU:   Understand these instructions.  Will watch your condition.  Will get help right away if you are not doing well or get worse. Document Released: 03/23/2005 Document Revised: 06/15/2011 Document Reviewed: 04/09/2011 [employer] Patient Information 2014 [employer].  

## 2012-12-16 NOTE — Progress Notes (Signed)
  Subjective:     Alexandria [name] is a 67 y.o. female who presents for evaluation of left ear pain. Symptoms have been present for 3 days. She also notes a plugged sensation in the left ear. She does not have a history of ear infections. She does not have a history of recent swimming.  The patient's history has been marked as reviewed and updated as appropriate. The patient&#39;s history has been marked as reviewed and updated as appropriate.   Review of Systems Pertinent items are noted in HPI.   Objective:    BP 110/70  Pulse 57  Temp(Src) 98.3 F (36.8 C) (Oral)  SpO2 97% General:  alert, cooperative, appears stated age and no distress  Right Ear: normal appearance  Left Ear: left TM normal landmarks and mobility and left canal red, inflamed and tender with movement of pinna  Mouth:  lips, mucosa, and tongue normal; teeth and gums normal  Neck: no adenopathy, no carotid bruit, no JVD, supple, symmetrical, trachea midline and thyroid not enlarged, symmetric, no tenderness/mass/nodules       Assessment:    Left otitis externa    Plan:    Treatment: Floxin Otic and ceftin. OTC analgesia as needed. Water exclusion from affected ear until symptoms resolve. Follow up in several days if symptoms not improving.

## 2012-12-19 DIAGNOSIS — M25579 Pain in unspecified ankle and joints of unspecified foot: Secondary | ICD-10-CM | POA: Diagnosis not present

## 2012-12-21 ENCOUNTER — Telehealth: Payer: Self-pay | Admitting: General Practice

## 2012-12-21 NOTE — Telephone Encounter (Signed)
plavix will make her bruise easily

## 2012-12-21 NOTE — Telephone Encounter (Signed)
Pt called wanting to let Dr. Laury Axon know that she is having bruising. Stated this was in regard to her latest office visit.

## 2012-12-22 ENCOUNTER — Telehealth: Payer: Self-pay | Admitting: Cardiovascular Disease

## 2012-12-22 NOTE — Telephone Encounter (Signed)
Assured patient she is going bruise easier on Plavix.  She is not taking ASA 81mg  although Dr. Tresa Endo has told her to take.  I told her ASA is normally taken w/Plavix but she is not going to take it.

## 2012-12-22 NOTE — Telephone Encounter (Signed)
She is bruising-her primary doctor said it might be from the Plavix. She says she been on Plavix quite a while.

## 2012-12-26 ENCOUNTER — Other Ambulatory Visit: Payer: Self-pay | Admitting: Family Medicine

## 2012-12-26 NOTE — Telephone Encounter (Signed)
Left a detailed message informing pt.  

## 2012-12-27 DIAGNOSIS — M25579 Pain in unspecified ankle and joints of unspecified foot: Secondary | ICD-10-CM | POA: Diagnosis not present

## 2013-01-10 DIAGNOSIS — M25579 Pain in unspecified ankle and joints of unspecified foot: Secondary | ICD-10-CM | POA: Diagnosis not present

## 2013-01-15 ENCOUNTER — Other Ambulatory Visit: Payer: Self-pay | Admitting: Family Medicine

## 2013-01-16 NOTE — Telephone Encounter (Signed)
Last filled 12/26/12 #253ml with no refills. Please advise     KP

## 2013-01-17 DIAGNOSIS — N760 Acute vaginitis: Secondary | ICD-10-CM | POA: Diagnosis not present

## 2013-01-26 ENCOUNTER — Telehealth: Payer: Self-pay | Admitting: General Practice

## 2013-01-26 DIAGNOSIS — E876 Hypokalemia: Secondary | ICD-10-CM

## 2013-01-26 NOTE — Telephone Encounter (Signed)
Pt called wanting to know if she needs to come in to get her potassium levels retested. Based on her last labs pt was suppose to have these rechecked in 1 month (last labs were completed 11-25-12) labs ordered and pt notified.

## 2013-01-27 ENCOUNTER — Other Ambulatory Visit (INDEPENDENT_AMBULATORY_CARE_PROVIDER_SITE_OTHER): Payer: Medicare Other

## 2013-01-27 DIAGNOSIS — E876 Hypokalemia: Secondary | ICD-10-CM | POA: Diagnosis not present

## 2013-01-27 DIAGNOSIS — E039 Hypothyroidism, unspecified: Secondary | ICD-10-CM | POA: Diagnosis not present

## 2013-01-27 LAB — BASIC METABOLIC PANEL
BUN: 13 mg/dL (ref 6–23)
Creatinine, Ser: 0.8 mg/dL (ref 0.4–1.2)
GFR: 75.79 mL/min (ref >60.00)
Glucose, Bld: 86 mg/dL (ref 70–99)
Potassium: 3.5 mEq/L (ref 3.5–5.1)

## 2013-01-31 ENCOUNTER — Encounter: Payer: Self-pay | Admitting: Family Medicine

## 2013-01-31 ENCOUNTER — Ambulatory Visit (INDEPENDENT_AMBULATORY_CARE_PROVIDER_SITE_OTHER): Payer: Medicare Other | Admitting: Family Medicine

## 2013-01-31 VITALS — BP 128/80 | HR 67 | Temp 98.3°F | Wt 148.8 lb

## 2013-01-31 DIAGNOSIS — H6690 Otitis media, unspecified, unspecified ear: Secondary | ICD-10-CM

## 2013-01-31 DIAGNOSIS — J019 Acute sinusitis, unspecified: Secondary | ICD-10-CM

## 2013-01-31 DIAGNOSIS — H6692 Otitis media, unspecified, left ear: Secondary | ICD-10-CM

## 2013-01-31 MED ORDER — LACTULOSE 10 GM/15ML PO SOLN: ORAL | Status: DC

## 2013-01-31 MED ORDER — POTASSIUM CHLORIDE CRYS ER 20 MEQ PO TBCR: 40.0000 meq | EXTENDED_RELEASE_TABLET | Freq: Every day | ORAL | Status: DC

## 2013-01-31 MED ORDER — GUAIFENESIN-CODEINE 100-10 MG/5ML PO SYRP: ORAL_SOLUTION | ORAL | Status: DC

## 2013-01-31 MED ORDER — CEFUROXIME AXETIL 500 MG PO TABS: 500.0000 mg | ORAL_TABLET | Freq: Two times a day (BID) | ORAL | Status: AC

## 2013-01-31 NOTE — Progress Notes (Signed)
  Subjective:     Alexandria [name] is a 67 y.o. female who presents for evaluation of sinus pain. Symptoms include: congestion, cough, facial pain, foul rhinorrhea, headaches, nasal congestion and sinus pressure. Onset of symptoms was 3 days ago. Symptoms have been gradually worsening since that time. Past history is significant for no history of pneumonia or bronchitis. Patient is a non-smoker.  The following portions of the patient's history were reviewed and updated as appropriate: allergies, current medications, past family history, past medical history, past social history, past surgical history and problem list.  Review of Systems Pertinent items are noted in HPI.   Objective:    BP 128/80  Pulse 67  Temp(Src) 98.3 F (36.8 C) (Oral)  Wt 148 lb 12.8 oz (67.495 kg)  BMI 25.94 kg/m2  SpO2 97% General appearance: alert, cooperative, appears stated age and no distress Ears: abnormal TM left ear - erythematous Nose: Nares normal. Septum midline. Mucosa normal. No drainage or sinus tenderness. Throat: abnormal findings: pnd Neck: mild anterior cervical adenopathy, supple, symmetrical, trachea midline and thyroid not enlarged, symmetric, no tenderness/mass/nodules Lungs: clear to auscultation bilaterally Heart: S1, S2 normal    Assessment:    Acute bacterial sinusitis.    Plan:    Nasal steroids per medication orders. Antihistamines per medication orders. Ceftin per medication orders. rto prn

## 2013-01-31 NOTE — Patient Instructions (Signed)

## 2013-02-01 ENCOUNTER — Ambulatory Visit: Payer: Medicare Other | Admitting: Family Medicine

## 2013-02-03 ENCOUNTER — Telehealth: Payer: Self-pay | Admitting: Family Medicine

## 2013-02-03 ENCOUNTER — Other Ambulatory Visit: Payer: Self-pay | Admitting: Family Medicine

## 2013-02-03 MED ORDER — DICYCLOMINE HCL 20 MG PO TABS: 80.0000 mg | ORAL_TABLET | Freq: Every day | ORAL | Status: DC

## 2013-02-03 NOTE — Telephone Encounter (Signed)
Patient is calling to request an rx for dicyclomine (BENTYL) 20 MG tablet. States that Dr. Laury Axon knows she is on it even the her former GI doctor, Dr. Artis Flock, is the one who originally prescribed. Patient says " I will not go back to Dr. Artis Flock ever, ever, ever again." Please advise.

## 2013-02-03 NOTE — Telephone Encounter (Signed)
Rx sent      KP 

## 2013-02-13 DIAGNOSIS — M25579 Pain in unspecified ankle and joints of unspecified foot: Secondary | ICD-10-CM | POA: Diagnosis not present

## 2013-02-16 DIAGNOSIS — B373 Candidiasis of vulva and vagina: Secondary | ICD-10-CM | POA: Diagnosis not present

## 2013-02-16 DIAGNOSIS — B3731 Acute candidiasis of vulva and vagina: Secondary | ICD-10-CM | POA: Diagnosis not present

## 2013-02-23 DIAGNOSIS — L821 Other seborrheic keratosis: Secondary | ICD-10-CM | POA: Diagnosis not present

## 2013-02-23 DIAGNOSIS — L659 Nonscarring hair loss, unspecified: Secondary | ICD-10-CM | POA: Diagnosis not present

## 2013-03-01 DIAGNOSIS — L650 Telogen effluvium: Secondary | ICD-10-CM | POA: Diagnosis not present

## 2013-03-17 ENCOUNTER — Other Ambulatory Visit: Payer: Self-pay | Admitting: Cardiology

## 2013-03-17 ENCOUNTER — Other Ambulatory Visit: Payer: Self-pay | Admitting: Cardiovascular Disease

## 2013-03-17 MED ORDER — FUROSEMIDE 40 MG PO TABS: 60.0000 mg | ORAL_TABLET | Freq: Every day | ORAL | Status: DC

## 2013-03-17 NOTE — Telephone Encounter (Signed)
Wanted to know if her Generic Lasix had been called in?

## 2013-03-17 NOTE — Telephone Encounter (Signed)
Pt was calling to check on her Lasix Rx. She stated that she called the pharmacy and they still do not have it. She is very concerned that it will not get filled today.

## 2013-03-17 NOTE — Telephone Encounter (Signed)
Rx was sent to pharmacy electronically. 

## 2013-03-20 ENCOUNTER — Telehealth: Payer: Self-pay | Admitting: Cardiovascular Disease

## 2013-03-20 NOTE — Telephone Encounter (Signed)
Per answering service from Sunday-03-19-13 -Please call her her prescription was called in wrong.

## 2013-03-20 NOTE — Telephone Encounter (Signed)
She wanted you to know she received all of her medicine.

## 2013-03-20 NOTE — Telephone Encounter (Signed)
Noted  

## 2013-03-20 NOTE — Telephone Encounter (Signed)
Returned call.  Left message to call back before 4pm and leave details r/t which med.

## 2013-04-10 DIAGNOSIS — I83893 Varicose veins of bilateral lower extremities with other complications: Secondary | ICD-10-CM | POA: Diagnosis not present

## 2013-04-18 ENCOUNTER — Telehealth: Payer: Self-pay | Admitting: Family Medicine

## 2013-04-18 NOTE — Telephone Encounter (Signed)
Msg left to call the office     KP 

## 2013-04-18 NOTE — Telephone Encounter (Signed)
Patient wants to speak with someone about a pain she has in her left rib. Please advise.

## 2013-04-19 NOTE — Telephone Encounter (Signed)
MSG left to call the office      KP 

## 2013-04-20 ENCOUNTER — Ambulatory Visit (INDEPENDENT_AMBULATORY_CARE_PROVIDER_SITE_OTHER): Payer: Medicare Other | Admitting: Family Medicine

## 2013-04-20 ENCOUNTER — Encounter: Payer: Self-pay | Admitting: Family Medicine

## 2013-04-20 VITALS — BP 116/80 | HR 64 | Temp 98.4°F | Wt 156.6 lb

## 2013-04-20 DIAGNOSIS — K5732 Diverticulitis of large intestine without perforation or abscess without bleeding: Secondary | ICD-10-CM

## 2013-04-20 DIAGNOSIS — R5383 Other fatigue: Principal | ICD-10-CM

## 2013-04-20 DIAGNOSIS — R5381 Other malaise: Secondary | ICD-10-CM

## 2013-04-20 LAB — BASIC METABOLIC PANEL WITH GFR
BUN: 13 mg/dL (ref 6–23)
CO2: 30 meq/L (ref 19–32)
Calcium: 8.8 mg/dL (ref 8.4–10.5)
Chloride: 102 meq/L (ref 96–112)
Creatinine, Ser: 0.8 mg/dL (ref 0.4–1.2)
GFR: 80.35 mL/min
Glucose, Bld: 77 mg/dL (ref 70–99)
Potassium: 3.4 meq/L — ABNORMAL LOW (ref 3.5–5.1)
Sodium: 137 meq/L (ref 135–145)

## 2013-04-20 LAB — HEPATIC FUNCTION PANEL
ALT: 19 U/L (ref 0–35)
AST: 22 U/L (ref 0–37)
Albumin: 3.6 g/dL (ref 3.5–5.2)
Alkaline Phosphatase: 51 U/L (ref 39–117)
Bilirubin, Direct: 0 mg/dL (ref 0.0–0.3)
Total Bilirubin: 0.6 mg/dL (ref 0.3–1.2)
Total Protein: 6.4 g/dL (ref 6.0–8.3)

## 2013-04-20 LAB — CBC WITH DIFFERENTIAL/PLATELET
Basophils Absolute: 0 10*3/uL (ref 0.0–0.1)
Basophils Relative: 0.7 % (ref 0.0–3.0)
EOS ABS: 0.2 10*3/uL (ref 0.0–0.7)
Eosinophils Relative: 5 % (ref 0.0–5.0)
HCT: 37.1 % (ref 36.0–46.0)
Hemoglobin: 12.7 g/dL (ref 12.0–15.0)
Lymphocytes Relative: 38.8 % (ref 12.0–46.0)
Lymphs Abs: 1.4 10*3/uL (ref 0.7–4.0)
MCHC: 34.1 g/dL (ref 30.0–36.0)
MCV: 96.3 fl (ref 78.0–100.0)
MONO ABS: 0.6 10*3/uL (ref 0.1–1.0)
Monocytes Relative: 17.1 % — ABNORMAL HIGH (ref 3.0–12.0)
NEUTROS PCT: 38.4 % — AB (ref 43.0–77.0)
Neutro Abs: 1.4 10*3/uL (ref 1.4–7.7)
Platelets: 187 10*3/uL (ref 150.0–400.0)
RBC: 3.86 Mil/uL — AB (ref 3.87–5.11)
RDW: 13.2 % (ref 11.5–14.6)
WBC: 3.7 10*3/uL — ABNORMAL LOW (ref 4.5–10.5)

## 2013-04-20 LAB — TSH: TSH: 0.81 u[IU]/mL (ref 0.35–5.50)

## 2013-04-20 MED ORDER — CIPROFLOXACIN HCL 500 MG PO TABS: 500.0000 mg | ORAL_TABLET | Freq: Two times a day (BID) | ORAL | Status: DC

## 2013-04-20 MED ORDER — METRONIDAZOLE 500 MG PO TABS: 500.0000 mg | ORAL_TABLET | Freq: Three times a day (TID) | ORAL | Status: DC

## 2013-04-20 NOTE — Progress Notes (Signed)
Pre visit review using our clinic review tool, if applicable. No additional management support is needed unless otherwise documented below in the visit note. 

## 2013-04-20 NOTE — Patient Instructions (Signed)
Diverticulitis °A diverticulum is a small pouch or sac on the colon. Diverticulosis is the presence of these diverticula on the colon. Diverticulitis is the irritation (inflammation) or infection of diverticula. °CAUSES  °The colon and its diverticula contain bacteria. If food particles block the tiny opening to a diverticulum, the bacteria inside can grow and cause an increase in pressure. This leads to infection and inflammation and is called diverticulitis. °SYMPTOMS  °· Abdominal pain and tenderness. Usually, the pain is located on the left side of your abdomen. However, it could be located elsewhere. °· Fever. °· Bloating. °· Feeling sick to your stomach (nausea). °· Throwing up (vomiting). °· Abnormal stools. °DIAGNOSIS  °Your caregiver will take a history and perform a physical exam. Since many things can cause abdominal pain, other tests may be necessary. Tests may include: °· Blood tests. °· Urine tests. °· X-ray of the abdomen. °· CT scan of the abdomen. °Sometimes, surgery is needed to determine if diverticulitis or other conditions are causing your symptoms. °TREATMENT  °Most of the time, you can be treated without surgery. Treatment includes: °· Resting the bowels by only having liquids for a few days. As you improve, you will need to eat a low-fiber diet. °· Intravenous (IV) fluids if you are losing body fluids (dehydrated). °· Antibiotic medicines that treat infections may be given. °· Pain and nausea medicine, if needed. °· Surgery if the inflamed diverticulum has burst. °HOME CARE INSTRUCTIONS  °· Try a clear liquid diet (broth, tea, or water for as long as directed by your caregiver). You may then gradually begin a low-fiber diet as tolerated.  °A low-fiber diet is a diet with less than 10 grams of fiber. Choose the foods below to reduce fiber in the diet: °· White breads, cereals, rice, and pasta. °· Cooked fruits and vegetables or soft fresh fruits and vegetables without the skin. °· Ground or  well-cooked tender beef, ham, veal, lamb, pork, or poultry. °· Eggs and seafood. °· After your diverticulitis symptoms have improved, your caregiver may put you on a high-fiber diet. A high-fiber diet includes 14 grams of fiber for every 1000 calories consumed. For a standard 2000 calorie diet, you would need 28 grams of fiber. Follow these diet guidelines to help you increase the fiber in your diet. It is important to slowly increase the amount fiber in your diet to avoid gas, constipation, and bloating. °· Choose whole-grain breads, cereals, pasta, and brown rice. °· Choose fresh fruits and vegetables with the skin on. Do not overcook vegetables because the more vegetables are cooked, the more fiber is lost. °· Choose more nuts, seeds, legumes, dried peas, beans, and lentils. °· Look for food products that have greater than 3 grams of fiber per serving on the Nutrition Facts label. °· Take all medicine as directed by your caregiver. °· If your caregiver has given you a follow-up appointment, it is very important that you go. Not going could result in lasting (chronic) or permanent injury, pain, and disability. If there is any problem keeping the appointment, call to reschedule. °SEEK MEDICAL CARE IF:  °· Your pain does not improve. °· You have a hard time advancing your diet beyond clear liquids. °· Your bowel movements do not return to normal. °SEEK IMMEDIATE MEDICAL CARE IF:  °· Your pain becomes worse. °· You have an oral temperature above 102° F (38.9° C), not controlled by medicine. °· You have repeated vomiting. °· You have bloody or black, tarry stools. °·   Symptoms that brought you to your caregiver become worse or are not getting better. °MAKE SURE YOU:  °· Understand these instructions. °· Will watch your condition. °· Will get help right away if you are not doing well or get worse. °Document Released: 12/31/2004 Document Revised: 06/15/2011 Document Reviewed: 04/28/2010 °ExitCare® Patient Information  ©2014 [employer]. ° °

## 2013-04-20 NOTE — Progress Notes (Signed)
  Subjective:     Alexandria [name] is a 68 y.o. female who presents for evaluation of abdominal pain. Onset was a few days ago. Symptoms have been gradually worsening. The pain is described as aching and cramping, and is 5/10 in intensity. Pain is located in the RUQ without radiation.  Aggravating factors: eating.  Alleviating factors: bowel movements. Associated symptoms: belching and flatus. The patient denies chills, dysuria, fever, frequency, headache, hematochezia, hematuria, melena, myalgias, nausea, sweats and vomiting.  The patient's history has been marked as reviewed and updated as appropriate.  Review of Systems Pertinent items are noted in HPI.     Objective:    BP 116/80  Pulse 64  Temp(Src) 98.4 F (36.9 C) (Oral)  Wt 156 lb 9.6 oz (71.033 kg)  SpO2 99% General appearance: alert, cooperative, appears stated age and no distress Nose: Nares normal. Septum midline. Mucosa normal. No drainage or sinus tenderness. Throat: lips, mucosa, and tongue normal; teeth and gums normal Heart: regular rate and rhythm, S1, S2 normal, no murmur, click, rub or gallop Abdomen: abnormal findings:  mild tenderness in the LUQ    Assessment:    Abdominal pain, likely secondary to ? diverticulitis .    Plan:    The diagnosis was discussed with the patient and evaluation and treatment plans outlined. Further follow-up plans will be based on outcome of lab/imaging studies; see orders. abx per orders

## 2013-04-21 ENCOUNTER — Telehealth: Payer: Self-pay | Admitting: *Deleted

## 2013-04-21 NOTE — Telephone Encounter (Signed)
MSG left advising labs are not completed but once they are I will call.     KP

## 2013-04-21 NOTE — Telephone Encounter (Signed)
Patient called to check the status of her lab results

## 2013-04-26 DIAGNOSIS — I872 Venous insufficiency (chronic) (peripheral): Secondary | ICD-10-CM | POA: Diagnosis not present

## 2013-04-26 DIAGNOSIS — I83893 Varicose veins of bilateral lower extremities with other complications: Secondary | ICD-10-CM | POA: Diagnosis not present

## 2013-04-27 ENCOUNTER — Telehealth: Payer: Self-pay | Admitting: Family Medicine

## 2013-04-27 NOTE — Telephone Encounter (Signed)
Notes Recorded by Rosalita Chessman, DO on 04/22/2013 at 12:37 PM K slightly low-- Inc k [employer] Otherwise ok con't current dose synthroid Recheck 1 year

## 2013-04-27 NOTE — Telephone Encounter (Signed)
Spoke with patient and she did not want to wait one year to repeat her CBC-d she is concerned about the levels of her WBC and RBC. Apt scheduled to recheck in 1 week per patient request.     KP

## 2013-04-27 NOTE — Telephone Encounter (Signed)
Patient is calling with questions about her most recent lab results. Please advise.

## 2013-05-01 ENCOUNTER — Other Ambulatory Visit (INDEPENDENT_AMBULATORY_CARE_PROVIDER_SITE_OTHER): Payer: Medicare Other

## 2013-05-01 DIAGNOSIS — D7289 Other specified disorders of white blood cells: Secondary | ICD-10-CM | POA: Diagnosis not present

## 2013-05-02 LAB — CBC WITH DIFFERENTIAL/PLATELET
BASOS PCT: 1.2 % (ref 0.0–3.0)
Basophils Absolute: 0.1 10*3/uL (ref 0.0–0.1)
Eosinophils Absolute: 0.2 10*3/uL (ref 0.0–0.7)
Eosinophils Relative: 5 % (ref 0.0–5.0)
HCT: 41.1 % (ref 36.0–46.0)
HEMOGLOBIN: 14.1 g/dL (ref 12.0–15.0)
LYMPHS PCT: 38.8 % (ref 12.0–46.0)
Lymphs Abs: 1.9 10*3/uL (ref 0.7–4.0)
MCHC: 34.3 g/dL (ref 30.0–36.0)
MCV: 95.7 fl (ref 78.0–100.0)
MONO ABS: 0.7 10*3/uL (ref 0.1–1.0)
Monocytes Relative: 13.4 % — ABNORMAL HIGH (ref 3.0–12.0)
NEUTROS ABS: 2 10*3/uL (ref 1.4–7.7)
Neutrophils Relative %: 41.6 % — ABNORMAL LOW (ref 43.0–77.0)
Platelets: 186 10*3/uL (ref 150.0–400.0)
RBC: 4.29 Mil/uL (ref 3.87–5.11)
RDW: 13.5 % (ref 11.5–14.6)
WBC: 4.9 10*3/uL (ref 4.5–10.5)

## 2013-05-03 ENCOUNTER — Telehealth: Payer: Self-pay | Admitting: Family Medicine

## 2013-05-03 NOTE — Telephone Encounter (Signed)
Patient is requesting a referral to hematologist due to her recent abnormal lab work. CB# [phone]

## 2013-05-04 ENCOUNTER — Other Ambulatory Visit: Payer: Medicare Other

## 2013-05-04 NOTE — Telephone Encounter (Signed)
Spoke with pt-- we will repeat cbcd in 4-6 weeks and follow every 3-6 months after that if necessary--- if wbc drops again we will refer to hematology---- pt agrees

## 2013-05-04 NOTE — Telephone Encounter (Signed)
Patient wants hematology because she feels her CBC-D abnormal.      KP

## 2013-05-18 ENCOUNTER — Telehealth: Payer: Self-pay | Admitting: *Deleted

## 2013-05-18 NOTE — Telephone Encounter (Signed)
Patient called to schedule her 28month lab apt. Please advise

## 2013-05-18 NOTE — Telephone Encounter (Signed)
Called pt back to further inquire about "3 month labs" she is attempting to schedule. In reviewing pts EMR, Dr. Etter Sjogren advised pt to recheck thyroid level in Jan 2016. LMOVM to call back if she had documentation to show [place] otherwise.

## 2013-05-21 ENCOUNTER — Other Ambulatory Visit: Payer: Self-pay | Admitting: Family Medicine

## 2013-06-05 ENCOUNTER — Ambulatory Visit (INDEPENDENT_AMBULATORY_CARE_PROVIDER_SITE_OTHER): Payer: Medicare Other | Admitting: Family Medicine

## 2013-06-05 ENCOUNTER — Encounter: Payer: Self-pay | Admitting: Family Medicine

## 2013-06-05 VITALS — BP 128/70 | HR 68 | Temp 98.4°F | Wt 149.0 lb

## 2013-06-05 DIAGNOSIS — R0989 Other specified symptoms and signs involving the circulatory and respiratory systems: Secondary | ICD-10-CM

## 2013-06-05 DIAGNOSIS — J40 Bronchitis, not specified as acute or chronic: Secondary | ICD-10-CM | POA: Diagnosis not present

## 2013-06-05 DIAGNOSIS — R0609 Other forms of dyspnea: Secondary | ICD-10-CM

## 2013-06-05 DIAGNOSIS — M76829 Posterior tibial tendinitis, unspecified leg: Secondary | ICD-10-CM | POA: Diagnosis not present

## 2013-06-05 MED ORDER — BECLOMETHASONE DIPROPIONATE 40 MCG/ACT IN AERS: 2.0000 | INHALATION_SPRAY | Freq: Two times a day (BID) | RESPIRATORY_TRACT | Status: DC

## 2013-06-05 MED ORDER — BECLOMETHASONE DIPROPIONATE 40 MCG/ACT IN AERS
2.0000 | INHALATION_SPRAY | Freq: Two times a day (BID) | RESPIRATORY_TRACT | Status: DC
Start: 2013-06-05 — End: 2013-06-17

## 2013-06-05 MED ORDER — AMOXICILLIN 875 MG PO TABS: 875.0000 mg | ORAL_TABLET | Freq: Two times a day (BID) | ORAL | Status: DC

## 2013-06-05 MED ORDER — ALBUTEROL SULFATE (2.5 MG/3ML) 0.083% IN NEBU
2.5000 mg | INHALATION_SOLUTION | Freq: Once | RESPIRATORY_TRACT | Status: AC
  Administered 2013-06-05: 2.5 mg via RESPIRATORY_TRACT

## 2013-06-05 MED ORDER — ALBUTEROL SULFATE HFA 108 (90 BASE) MCG/ACT IN AERS: 2.0000 | INHALATION_SPRAY | Freq: Four times a day (QID) | RESPIRATORY_TRACT | Status: DC | PRN

## 2013-06-05 MED ORDER — METHYLPREDNISOLONE ACETATE 80 MG/ML IJ SUSP
80.0000 mg | Freq: Once | INTRAMUSCULAR | Status: AC
  Administered 2013-06-05: 80 mg via INTRAMUSCULAR

## 2013-06-05 NOTE — Patient Instructions (Signed)

## 2013-06-05 NOTE — Progress Notes (Signed)
  Subjective:     Alexandria [name] is a 68 y.o. female here for evaluation of a cough. Onset of symptoms was 2 weeks ago. Symptoms have been gradually worsening since that time. The cough is barky and dry and is aggravated by cold air, exercise, infection and reclining position. Associated symptoms include: chills, shortness of breath and wheezing. Patient does not have a history of asthma. Patient does not have a history of environmental allergens. Patient has not traveled recently. Patient does not have a history of smoking. Patient has not had a previous chest x-ray. Patient has not had a PPD done.  The following portions of the patient's history were reviewed and updated as appropriate: allergies, current medications, past family history, past medical history, past social history, past surgical history and problem list.  Review of Systems Pertinent items are noted in HPI.    Objective:    Oxygen saturation 98% on room air BP 128/70  Pulse 68  Temp(Src) 98.4 F (36.9 C) (Oral)  Wt 149 lb (67.586 kg)  SpO2 98% General appearance: alert, cooperative, appears stated age and no distress Ears: normal TM's and external ear canals both ears Nose: Nares normal. Septum midline. Mucosa normal. No drainage or sinus tenderness. Throat: lips, mucosa, and tongue normal; teeth and gums normal Neck: mild anterior cervical adenopathy, supple, symmetrical, trachea midline and thyroid not enlarged, symmetric, no tenderness/mass/nodules Lungs: wheezes bilaterally Heart: S1, S2 normal Extremities: extremities normal, atraumatic, no cyanosis or edema    Assessment:    Acute Bronchitis    Plan:    Antibiotics per medication orders. Avoid exposure to tobacco smoke and fumes. B-agonist inhaler. Call if shortness of breath worsens, blood in sputum, change in character of cough, development of fever or chills, inability to maintain nutrition and hydration. Avoid exposure to tobacco smoke and  fumes. Steroid inhaler as ordered.

## 2013-06-05 NOTE — Progress Notes (Signed)
Pre visit review using our clinic review tool, if applicable. No additional management support is needed unless otherwise documented below in the visit note. 

## 2013-06-12 DIAGNOSIS — M76829 Posterior tibial tendinitis, unspecified leg: Secondary | ICD-10-CM | POA: Diagnosis not present

## 2013-06-15 ENCOUNTER — Encounter: Payer: Self-pay | Admitting: Family Medicine

## 2013-06-15 ENCOUNTER — Ambulatory Visit (INDEPENDENT_AMBULATORY_CARE_PROVIDER_SITE_OTHER): Payer: Medicare Other | Admitting: Family Medicine

## 2013-06-15 VITALS — BP 118/70 | HR 72 | Temp 98.1°F | Wt 140.0 lb

## 2013-06-15 DIAGNOSIS — J9801 Acute bronchospasm: Secondary | ICD-10-CM | POA: Diagnosis not present

## 2013-06-15 DIAGNOSIS — R059 Cough, unspecified: Secondary | ICD-10-CM

## 2013-06-15 DIAGNOSIS — R05 Cough: Secondary | ICD-10-CM

## 2013-06-15 MED ORDER — IPRATROPIUM-ALBUTEROL 0.5-2.5 (3) MG/3ML IN SOLN
3.0000 mL | Freq: Once | RESPIRATORY_TRACT | Status: AC
  Administered 2013-06-15: 3 mL via RESPIRATORY_TRACT

## 2013-06-15 NOTE — Progress Notes (Signed)
Pre visit review using our clinic review tool, if applicable. No additional management support is needed unless otherwise documented below in the visit note. 

## 2013-06-15 NOTE — Progress Notes (Signed)
  Subjective:     Alexandria [name] is a 68 y.o. female here for evaluation of a cough. Onset of symptoms was 1 day ago. Symptoms have been gradually worsening since that time. The cough is dry and is aggravated by pollens. Associated symptoms include: shortness of breath and wheezing. Patient does not have a history of asthma. Patient does have a history of environmental allergens. Patient has not traveled recently. Patient does not have a history of smoking. Patient has not had a previous chest x-ray. Patient has not had a PPD done.  The following portions of the patient's history were reviewed and updated as appropriate: allergies, current medications, past family history, past medical history, past social history, past surgical history and problem list.  Review of Systems Pertinent items are noted in HPI.    Objective:    Oxygen saturation 97% on room air BP 118/70  Pulse 72  Temp(Src) 98.1 F (36.7 C) (Oral)  Wt 140 lb (63.504 kg)  SpO2 97% General appearance: alert, cooperative, appears stated age and no distress Ears: normal TM's and external ear canals both ears Nose: Nares normal. Septum midline. Mucosa normal. No drainage or sinus tenderness. Throat: lips, mucosa, and tongue normal; teeth and gums normal Neck: no adenopathy, no carotid bruit, no JVD, supple, symmetrical, trachea midline and thyroid not enlarged, symmetric, no tenderness/mass/nodules Lungs: wheezes bilaterally Heart: S1, S2 normal    Assessment:    bronchospasm with normal spirometry  ? Reactive airway    Plan:    Explained lack of efficacy of antibiotics in viral disease. Antitussives per medication orders. Avoid exposure to tobacco smoke and fumes. B-agonist inhaler. Call if shortness of breath worsens, blood in sputum, change in character of cough, development of fever or chills, inability to maintain nutrition and hydration. Avoid exposure to tobacco smoke and fumes. Pulmonary  consult. Spirometry. Steroid inhaler as ordered.

## 2013-06-17 ENCOUNTER — Ambulatory Visit (INDEPENDENT_AMBULATORY_CARE_PROVIDER_SITE_OTHER): Payer: Medicare Other | Admitting: Family Medicine

## 2013-06-17 ENCOUNTER — Encounter: Payer: Self-pay | Admitting: Family Medicine

## 2013-06-17 VITALS — BP 142/92 | HR 74 | Temp 98.2°F | Wt 149.8 lb

## 2013-06-17 DIAGNOSIS — J019 Acute sinusitis, unspecified: Secondary | ICD-10-CM | POA: Diagnosis not present

## 2013-06-17 DIAGNOSIS — J0100 Acute maxillary sinusitis, unspecified: Secondary | ICD-10-CM | POA: Insufficient documentation

## 2013-06-17 MED ORDER — GUAIFENESIN-CODEINE 100-10 MG/5ML PO SYRP: 5.0000 mL | ORAL_SOLUTION | Freq: Three times a day (TID) | ORAL | Status: DC | PRN

## 2013-06-17 MED ORDER — DOXYCYCLINE HYCLATE 100 MG PO TABS: 100.0000 mg | ORAL_TABLET | Freq: Two times a day (BID) | ORAL | Status: DC

## 2013-06-17 NOTE — Progress Notes (Signed)
Pre visit review using our clinic review tool, if applicable. No additional management support is needed unless otherwise documented below in the visit note.  Cough.  Started 3 weeks ago.  Sputum.  Persistent dry cough that comes in fits.  Worse with talking.  SABA helps with the cough, it makes her jittery with 2 puffs.  No wheeze known to patient.  No fevers.  No diarrhea.  Sinus pain, maxillary, L>R.  No ear pain.  No rhinorrhea, stuffy.  No ST.  She was previous getting better but then globally worsened again.    Had used cheratussin with some help for the cough. No ADE on the med.  Asking for refill.    Meds, vitals, and allergies reviewed.   ROS: See HPI.  Otherwise, noncontributory.  GEN: nad, alert and oriented HEENT: mucous membranes moist, tm w/o erythema, nasal exam w/o erythema, clear discharge noted,  OP with cobblestoning, L max and frontal sinuses ttp NECK: supple w/o LA CV: rrr.   PULM: ctab, no inc wob, but dry cough noted EXT: no edema SKIN: no acute rash

## 2013-06-17 NOTE — Patient Instructions (Signed)
Start the antibiotic today and use the inhaler and syrup for the cough.  Drink plenty of fluids, take tylenol as needed, and gargle with warm salt water for your throat if needed.  This should gradually improve.  Take care.  Let us know if you have other concerns.

## 2013-06-17 NOTE — Assessment & Plan Note (Signed)
With cough.  Would cover with doxy.  Supportive care o/w.  Nontoxic.  Use cough syrup in meantime.  F/u prn.

## 2013-06-21 DIAGNOSIS — M76829 Posterior tibial tendinitis, unspecified leg: Secondary | ICD-10-CM | POA: Diagnosis not present

## 2013-06-22 ENCOUNTER — Other Ambulatory Visit: Payer: Self-pay | Admitting: Family Medicine

## 2013-07-03 ENCOUNTER — Other Ambulatory Visit: Payer: Self-pay | Admitting: Family Medicine

## 2013-07-04 ENCOUNTER — Other Ambulatory Visit: Payer: Self-pay | Admitting: *Deleted

## 2013-07-04 MED ORDER — CLOPIDOGREL BISULFATE 75 MG PO TABS: 75.0000 mg | ORAL_TABLET | Freq: Every day | ORAL | Status: DC

## 2013-07-04 NOTE — Telephone Encounter (Signed)
Rx was sent to pharmacy electronically. 

## 2013-07-06 ENCOUNTER — Institutional Professional Consult (permissible substitution): Payer: Medicare Other | Admitting: Pulmonary Disease

## 2013-07-10 ENCOUNTER — Other Ambulatory Visit: Payer: Medicare Other

## 2013-07-11 ENCOUNTER — Ambulatory Visit (INDEPENDENT_AMBULATORY_CARE_PROVIDER_SITE_OTHER): Payer: Medicare Other | Admitting: Family Medicine

## 2013-07-11 ENCOUNTER — Encounter: Payer: Self-pay | Admitting: Family Medicine

## 2013-07-11 VITALS — BP 114/72 | HR 81 | Temp 98.0°F | Wt 145.2 lb

## 2013-07-11 DIAGNOSIS — E876 Hypokalemia: Secondary | ICD-10-CM | POA: Diagnosis not present

## 2013-07-11 DIAGNOSIS — D7289 Other specified disorders of white blood cells: Secondary | ICD-10-CM | POA: Diagnosis not present

## 2013-07-11 DIAGNOSIS — L0201 Cutaneous abscess of face: Secondary | ICD-10-CM

## 2013-07-11 DIAGNOSIS — L03211 Cellulitis of face: Secondary | ICD-10-CM | POA: Diagnosis not present

## 2013-07-11 MED ORDER — CEPHALEXIN 500 MG PO CAPS: 500.0000 mg | ORAL_CAPSULE | Freq: Two times a day (BID) | ORAL | Status: DC

## 2013-07-11 NOTE — Progress Notes (Signed)
   Subjective:    Patient ID: Alexandria [name], female    DOB: [birth year], 68 y.o.   MRN: [id number]  HPI Pt here c/o abscess on R side of nose.  Tender to touch, no drainage.     Review of Systems As above    Objective:   Physical Exam BP 114/72  Pulse 81  Temp(Src) 98 F (36.7 C) (Oral)  Wt 145 lb 3.2 oz (65.862 kg)  SpO2 98% General appearance: alert, cooperative, appears stated age and no distress Skin: abscess r side of nose--tender to touch, not draining        Assessment & Plan:  Abscess--  Warm compresses, abx per orders                 F/u derm if no better

## 2013-07-11 NOTE — Progress Notes (Signed)
Pre visit review using our clinic review tool, if applicable. No additional management support is needed unless otherwise documented below in the visit note. 

## 2013-07-11 NOTE — Patient Instructions (Signed)
Abscess An abscess is an infected area that contains a collection of pus and debris.It can occur in almost any part of the body. An abscess is also known as a furuncle or boil. CAUSES  An abscess occurs when tissue gets infected. This can occur from blockage of oil or sweat glands, infection of hair follicles, or a minor injury to the skin. As the body tries to fight the infection, pus collects in the area and creates pressure under the skin. This pressure causes pain. People with weakened immune systems have difficulty fighting infections and get certain abscesses more often.  SYMPTOMS Usually an abscess develops on the skin and becomes a painful mass that is red, warm, and tender. If the abscess forms under the skin, you may feel a moveable soft area under the skin. Some abscesses break open (rupture) on their own, but most will continue to get worse without care. The infection can spread deeper into the body and eventually into the bloodstream, causing you to feel ill.  DIAGNOSIS  Your caregiver will take your medical history and perform a physical exam. A sample of fluid may also be taken from the abscess to determine what is causing your infection. TREATMENT  Your caregiver may prescribe antibiotic medicines to fight the infection. However, taking antibiotics alone usually does not cure an abscess. Your caregiver may need to make a small cut (incision) in the abscess to drain the pus. In some cases, gauze is packed into the abscess to reduce pain and to continue draining the area. HOME CARE INSTRUCTIONS   Only take over-the-counter or prescription medicines for pain, discomfort, or fever as directed by your caregiver.  If you were prescribed antibiotics, take them as directed. Finish them even if you start to feel better.  If gauze is used, follow your caregiver's directions for changing the gauze.  To avoid spreading the infection:  Keep your draining abscess covered with a  bandage.  Wash your hands well.  Do not share personal care items, towels, or whirlpools with others.  Avoid skin contact with others.  Keep your skin and clothes clean around the abscess.  Keep all follow-up appointments as directed by your caregiver. SEEK MEDICAL CARE IF:   You have increased pain, swelling, redness, fluid drainage, or bleeding.  You have muscle aches, chills, or a general ill feeling.  You have a fever. MAKE SURE YOU:   Understand these instructions.  Will watch your condition.  Will get help right away if you are not doing well or get worse. Document Released: 12/31/2004 Document Revised: 09/22/2011 Document Reviewed: 06/05/2011 ExitCare Patient Information 2014 [employer].  

## 2013-07-12 ENCOUNTER — Other Ambulatory Visit: Payer: Medicare Other

## 2013-07-14 DIAGNOSIS — L723 Sebaceous cyst: Secondary | ICD-10-CM | POA: Diagnosis not present

## 2013-07-17 DIAGNOSIS — Z803 Family history of malignant neoplasm of breast: Secondary | ICD-10-CM | POA: Diagnosis not present

## 2013-07-17 DIAGNOSIS — Z1231 Encounter for screening mammogram for malignant neoplasm of breast: Secondary | ICD-10-CM | POA: Diagnosis not present

## 2013-07-27 ENCOUNTER — Telehealth: Payer: Self-pay | Admitting: Family Medicine

## 2013-07-27 NOTE — Telephone Encounter (Signed)
The results are not in the system but the sample was collected. Sheena please advise     KP

## 2013-07-27 NOTE — Telephone Encounter (Signed)
Patient called to check on her labs that she had done on 07/12/2013. Please advise.

## 2013-07-31 NOTE — Telephone Encounter (Signed)
Spoke with patient and advised her that she needed to come back in and repeat her CBC. Patient voiced that she understood and will come tomorrow morning.

## 2013-08-01 ENCOUNTER — Telehealth: Payer: Self-pay

## 2013-08-01 LAB — CBC WITH DIFFERENTIAL/PLATELET
BASOS ABS: 0 10*3/uL (ref 0.0–0.1)
Basophils Relative: 0.9 % (ref 0.0–3.0)
EOS ABS: 0.2 10*3/uL (ref 0.0–0.7)
Eosinophils Relative: 4.7 % (ref 0.0–5.0)
HCT: 39.9 % (ref 36.0–46.0)
Hemoglobin: 13.4 g/dL (ref 12.0–15.0)
LYMPHS ABS: 1.3 10*3/uL (ref 0.7–4.0)
Lymphocytes Relative: 31.5 % (ref 12.0–46.0)
MCHC: 33.7 g/dL (ref 30.0–36.0)
MCV: 97.6 fl (ref 78.0–100.0)
MONOS PCT: 13.4 % — AB (ref 3.0–12.0)
Monocytes Absolute: 0.6 10*3/uL (ref 0.1–1.0)
NEUTROS ABS: 2.1 10*3/uL (ref 1.4–7.7)
Neutrophils Relative %: 49.5 % (ref 43.0–77.0)
PLATELETS: 198 10*3/uL (ref 150.0–400.0)
RBC: 4.08 Mil/uL (ref 3.87–5.11)
RDW: 13.4 % (ref 11.5–14.6)
WBC: 4.3 10*3/uL — ABNORMAL LOW (ref 4.5–10.5)

## 2013-08-01 LAB — BASIC METABOLIC PANEL
BUN: 12 mg/dL (ref 6–23)
CO2: 30 meq/L (ref 19–32)
CREATININE: 0.8 mg/dL (ref 0.4–1.2)
Calcium: 9.4 mg/dL (ref 8.4–10.5)
Chloride: 102 mEq/L (ref 96–112)
GFR: 81.52 mL/min (ref >60.00)
Glucose, Bld: 83 mg/dL (ref 70–99)
Potassium: 3.3 mEq/L — ABNORMAL LOW (ref 3.5–5.1)
SODIUM: 139 meq/L (ref 135–145)

## 2013-08-01 NOTE — Telephone Encounter (Signed)
FYI Patient came in for labs and questioned lab tech about bruising that she is experiencing on hands, arm, and legs. States that she does not know how she got them. Observation appears as if bruising is healing. Advised that if she would like further evaluation she would need to schedule to see her provider.

## 2013-08-01 NOTE — Addendum Note (Signed)
Addended by: Modena Morrow D on: 08/01/2013 01:57 PM   Modules accepted: Orders

## 2013-08-02 DIAGNOSIS — L708 Other acne: Secondary | ICD-10-CM | POA: Diagnosis not present

## 2013-08-03 ENCOUNTER — Other Ambulatory Visit: Payer: Self-pay

## 2013-08-03 MED ORDER — POTASSIUM CHLORIDE CRYS ER 20 MEQ PO TBCR: 40.0000 meq | EXTENDED_RELEASE_TABLET | Freq: Every day | ORAL | Status: DC

## 2013-08-08 ENCOUNTER — Telehealth: Payer: Self-pay | Admitting: Family Medicine

## 2013-08-08 DIAGNOSIS — E876 Hypokalemia: Secondary | ICD-10-CM

## 2013-08-08 NOTE — Telephone Encounter (Signed)
Caller name: Richrd Sox Relation to pt: self  Call back number: [phone] Pharmacy:  Reason for call:  Pt was returning a call to [employer]

## 2013-08-08 NOTE — Telephone Encounter (Signed)
K low --- If she is taking 2 potassium a day we may need to change the diuretic to spironolactone 25 Mg #30 1 po qd  With ov in 2 weeks Also pt asked about bruising at lab visit----let her know that plavix causes brusing

## 2013-08-09 NOTE — Telephone Encounter (Signed)
MSG left to call the office      KP 

## 2013-08-10 NOTE — Telephone Encounter (Signed)
Patient wants to know if she cuts the Lasix down to 1 daily instead of 1.5 will that help the potassium instead of switching. Please advise     KP

## 2013-08-10 NOTE — Telephone Encounter (Signed)
Yes it could help---- ok to lower dose to 1 a day

## 2013-08-10 NOTE — Telephone Encounter (Signed)
spoke with patient and she voiced understanding, she will decrease the Lasix to 1 per day, Lab apt has been scheduled for 5/21.    KP

## 2013-08-17 ENCOUNTER — Institutional Professional Consult (permissible substitution): Payer: Medicare Other | Admitting: Pulmonary Disease

## 2013-08-23 DIAGNOSIS — Z01419 Encounter for gynecological examination (general) (routine) without abnormal findings: Secondary | ICD-10-CM | POA: Diagnosis not present

## 2013-08-23 DIAGNOSIS — N760 Acute vaginitis: Secondary | ICD-10-CM | POA: Diagnosis not present

## 2013-08-24 ENCOUNTER — Other Ambulatory Visit (INDEPENDENT_AMBULATORY_CARE_PROVIDER_SITE_OTHER): Payer: Medicare Other

## 2013-08-24 DIAGNOSIS — E876 Hypokalemia: Secondary | ICD-10-CM

## 2013-08-24 LAB — BASIC METABOLIC PANEL
BUN: 12 mg/dL (ref 6–23)
CHLORIDE: 102 meq/L (ref 96–112)
CO2: 32 mEq/L (ref 19–32)
Calcium: 9.2 mg/dL (ref 8.4–10.5)
Creatinine, Ser: 0.8 mg/dL (ref 0.4–1.2)
GFR: 71.52 mL/min (ref >60.00)
Glucose, Bld: 82 mg/dL (ref 70–99)
POTASSIUM: 3.5 meq/L (ref 3.5–5.1)
SODIUM: 140 meq/L (ref 135–145)

## 2013-08-31 ENCOUNTER — Telehealth: Payer: Self-pay | Admitting: *Deleted

## 2013-08-31 ENCOUNTER — Institutional Professional Consult (permissible substitution): Payer: Medicare Other | Admitting: Pulmonary Disease

## 2013-08-31 NOTE — Telephone Encounter (Signed)
Caller name:  Fredia Relation to pt:  self Call back number: Pharmacy:  Reason for call:   Pt called about the labs she had done 08/24/2013.  I spoke to Ronaldo Miyamoto, and advised the patient the labs were normal and a copy of the results were mailed to her on 08/25/2013.

## 2013-09-15 DIAGNOSIS — M1710 Unilateral primary osteoarthritis, unspecified knee: Secondary | ICD-10-CM | POA: Diagnosis not present

## 2013-09-21 ENCOUNTER — Ambulatory Visit: Payer: Medicare Other | Admitting: Family Medicine

## 2013-09-22 ENCOUNTER — Ambulatory Visit (INDEPENDENT_AMBULATORY_CARE_PROVIDER_SITE_OTHER): Payer: Medicare Other | Admitting: Family Medicine

## 2013-09-22 ENCOUNTER — Encounter: Payer: Self-pay | Admitting: Family Medicine

## 2013-09-22 VITALS — BP 122/76 | HR 56 | Temp 98.4°F | Wt 148.0 lb

## 2013-09-22 DIAGNOSIS — T148XXA Other injury of unspecified body region, initial encounter: Secondary | ICD-10-CM | POA: Diagnosis not present

## 2013-09-22 LAB — APTT: APTT: 27.8 s (ref 21.7–28.8)

## 2013-09-22 LAB — PROTIME-INR
INR: 0.9 ratio (ref 0.8–1.0)
Prothrombin Time: 10.2 s (ref 9.6–13.1)

## 2013-09-22 NOTE — Patient Instructions (Signed)
Contusion °A contusion is a deep bruise. Contusions are the result of an injury that caused bleeding under the skin. The contusion may turn blue, purple, or yellow. Minor injuries will give you a painless contusion, but more severe contusions may stay painful and swollen for a few weeks.  °CAUSES  °A contusion is usually caused by a blow, trauma, or direct force to an area of the body. °SYMPTOMS  °· Swelling and redness of the injured area. °· Bruising of the injured area. °· Tenderness and soreness of the injured area. °· Pain. °DIAGNOSIS  °The diagnosis can be made by taking a history and physical exam. An X-ray, CT scan, or MRI may be needed to determine if there were any associated injuries, such as fractures. °TREATMENT  °Specific treatment will depend on what area of the body was injured. In general, the best treatment for a contusion is resting, icing, elevating, and applying cold compresses to the injured area. Over-the-counter medicines may also be recommended for pain control. Ask your caregiver what the best treatment is for your contusion. °HOME CARE INSTRUCTIONS  °· Put ice on the injured area. °¨ Put ice in a plastic bag. °¨ Place a towel between your skin and the bag. °¨ Leave the ice on for 15-20 minutes, 3-4 times a day, or as directed by your health care provider. °· Only take over-the-counter or prescription medicines for pain, discomfort, or fever as directed by your caregiver. Your caregiver may recommend avoiding anti-inflammatory medicines (aspirin, ibuprofen, and naproxen) for 48 hours because these medicines may increase bruising. °· Rest the injured area. °· If possible, elevate the injured area to reduce swelling. °SEEK IMMEDIATE MEDICAL CARE IF:  °· You have increased bruising or swelling. °· You have pain that is getting worse. °· Your swelling or pain is not relieved with medicines. °MAKE SURE YOU:  °· Understand these instructions. °· Will watch your condition. °· Will get help right  away if you are not doing well or get worse. °Document Released: 12/31/2004 Document Revised: 03/28/2013 Document Reviewed: 01/26/2011 °ExitCare® Patient Information ©2015 [employer]. This information is not intended to replace advice given to you by your health care provider. Make sure you discuss any questions you have with your health care provider. ° °

## 2013-09-22 NOTE — Progress Notes (Signed)
   Subjective:    Patient ID: Alexandria [name], female    DOB: July 16, 1945, 68 y.o.   MRN: [id number]  HPI Pt here c/o bruising easily.  She is concerned something is wrong.  No other complaints.  She denies any injury-- and denies any abuse.   Review of Systems As above    Objective:   Physical Exam  BP 122/76  Pulse 56  Temp(Src) 98.4 F (36.9 C) (Oral)  Wt 148 lb (67.132 kg)  SpO2 96% General appearance: alert, cooperative, appears stated age and no distress Lungs: clear to auscultation bilaterally Heart: S1, S2 normal Extremities: extremities normal, atraumatic, no cyanosis or edema Skin: Skin color, texture, turgor normal. No rashes or lesions-- multiple bruises on arms and legs       Assessment & Plan:  1. Bruising Probably from plavix-- pt will discuss with cardiology to see if it can be changed - Protime-INR; Future - PTT - Protime-INR Other labs normal at previous visit for same

## 2013-09-22 NOTE — Progress Notes (Signed)
Pre visit review using our clinic review tool, if applicable. No additional management support is needed unless otherwise documented below in the visit note. 

## 2013-09-25 ENCOUNTER — Encounter: Payer: Self-pay | Admitting: Pulmonary Disease

## 2013-09-25 ENCOUNTER — Ambulatory Visit (INDEPENDENT_AMBULATORY_CARE_PROVIDER_SITE_OTHER): Payer: Medicare Other | Admitting: Pulmonary Disease

## 2013-09-25 ENCOUNTER — Ambulatory Visit (INDEPENDENT_AMBULATORY_CARE_PROVIDER_SITE_OTHER)
Admission: RE | Admit: 2013-09-25 | Discharge: 2013-09-25 | Disposition: A | Discharge status: Home / Self Care | Payer: Medicare Other | Source: Ambulatory Visit | Attending: Pulmonary Disease | Admitting: Pulmonary Disease

## 2013-09-25 VITALS — BP 118/70 | HR 60 | Temp 98.4°F | Ht 64.0 in | Wt 151.8 lb

## 2013-09-25 DIAGNOSIS — R0609 Other forms of dyspnea: Secondary | ICD-10-CM | POA: Diagnosis not present

## 2013-09-25 DIAGNOSIS — R0989 Other specified symptoms and signs involving the circulatory and respiratory systems: Secondary | ICD-10-CM

## 2013-09-25 DIAGNOSIS — R0600 Dyspnea, unspecified: Secondary | ICD-10-CM

## 2013-09-25 DIAGNOSIS — R0602 Shortness of breath: Secondary | ICD-10-CM

## 2013-09-25 DIAGNOSIS — J42 Unspecified chronic bronchitis: Secondary | ICD-10-CM | POA: Diagnosis not present

## 2013-09-25 NOTE — Patient Instructions (Signed)
CXR today OK to stop albuterol Suggest get back with DR Claiborne Billings to review cardiac testing & cause for fluid build up

## 2013-09-25 NOTE — Progress Notes (Signed)
Subjective:    Patient ID: Alexandria [name], female    DOB: 06/15/1945, 68 y.o.   MRN: [id number]  HPI  PCP - Etter Sjogren  68 year old remote smoker presents for evaluation of dyspnea for one year. She states that about twice a week she wakes up with a sensation of not being able to breathe, as if she is drowning in fluid - this last as long as 4 hours and then resolves after she urinates. She reports pedal edema on and off for the past year, she was asked by her cardiologist Dr. Claiborne Billings to increase Lasix to 60 mg daily. She also reports a dry cough and occasional hoarseness of voice. She denies orthopnea or paroxysmal nocturnal dyspnea. She denies loud snoring or witnessed apneas by her husband. Is no history of wheezing or frequent chest colds. She has a history of DVT more than 25 years ago when she lived in [place]. She is a retired Glass blower/designer from [employer] and lives with her husband is a retired Engineer, structural. She smoked less than 5 pack years in her 56s and quit when she was 38. Spirometry no in 06/2013 was a poor effort and did not show any evidence of airway obstruction. Chest x-ray from 12/2009 did not show any infiltrates or effusions. CT abdomen from 2013 does not show any evidence of fibrosis at the bases of the lungs. She was placed on albuterol which did not provide any relief. She reports it fibrillation that is rate controlled. She reports easy bruising on Plavix.   Past Medical History  Diagnosis Date  . Arthritis   . Diverticulitis   . Migraines   . DVT (deep venous thrombosis)     lower extremity  . Colon polyp   . Thyroid disease   . TIA (transient ischemic attack)     8 years ago  . Heart attack     10 years ago  . Heart murmur     Past Surgical History  Procedure Laterality Date  . Vaginal hysterectomy  10/17/1998    Dory Horn  . Breast biopsy      Isaiah Blakes  . Total knee arthroplasty      Bilateral x's 2  . Neck surgery      x's 2    Allergies    Allergen Reactions  . Prednisone Other (See Comments)    Homicidal  . Hydrocodone Itching    History   Social History  . Marital Status: Married    Spouse Name: N/A    Number of Children: 3  . Years of Education: N/A   Occupational History  . DISABLED    Social History Main Topics  . Smoking status: Former Smoker -- 2 years    Quit date: 04/06/1941  . Smokeless tobacco: Never Used     Comment: socially smoked x 2 years  . Alcohol Use: No  . Drug Use: No  . Sexual Activity: Not on file   Other Topics Concern  . Not on file   Social History Narrative  . No narrative on file    Family History  Problem Relation Age of Onset  . Arthritis    . Colon cancer Father   . HIV Brother   . Kidney cancer Brother   . Colon cancer Brother   . Lung cancer Brother   . Other Brother     Mouth Cancer  . Ovarian cancer Mother   . Uterine cancer Mother   . Prostate cancer  Father   . Heart disease Maternal Grandmother   . Stroke Maternal Grandmother   . Hypertension Maternal Grandmother   . Diabetes Maternal Grandmother       Review of Systems  Constitutional: Positive for appetite change. Negative for fever and unexpected weight change.  HENT: Positive for congestion. Negative for dental problem, ear pain, nosebleeds, postnasal drip, rhinorrhea, sinus pressure, sneezing, sore throat and trouble swallowing.   Eyes: Negative for redness and itching.  Respiratory: Negative for cough, chest tightness, shortness of breath and wheezing.   Cardiovascular: Positive for palpitations. Negative for leg swelling.  Gastrointestinal: Positive for abdominal pain. Negative for nausea and vomiting.  Genitourinary: Negative for dysuria.  Musculoskeletal: Positive for joint swelling.  Skin: Negative for rash.  Neurological: Positive for headaches.  Hematological: Does not bruise/bleed easily.  Psychiatric/Behavioral: Negative for dysphoric mood. The patient is not nervous/anxious.         Objective:   Physical Exam  Gen. Pleasant, well-nourished, in no distress, normal affect ENT - no lesions, no post nasal drip Neck: No JVD, no thyromegaly, no carotid bruits Lungs: no use of accessory muscles, no dullness to percussion, clear without rales or rhonchi  Cardiovascular: Rhythm regular, heart sounds  normal, no murmurs or gallops, 1+ peripheral edema Abdomen: soft and non-tender, no hepatosplenomegaly, BS normal. Musculoskeletal: No deformities, no cyanosis or clubbing Neuro:  alert, non focal        Assessment & Plan:

## 2013-09-25 NOTE — Assessment & Plan Note (Signed)
I frankly told her that no pulmonary cause is apparent on my evaluation today for her early morning dyspnea. CXR today OK to stop albuterol since no airway obstruction on spirometry Suggest get back with DR Claiborne Billings to review cardiac testing & cause for fluid build up - I would like to review her echocardiogram to make sure that she does not have diastolic dysfunction or pulmonary hypertension, which may explain her pedal edema and dyspnea. She has no risk  Factors for venous thromboembolism, we can still obtain a d-dimer for completion

## 2013-09-26 ENCOUNTER — Telehealth: Payer: Self-pay | Admitting: Cardiovascular Disease

## 2013-09-26 NOTE — Telephone Encounter (Signed)
Pt says she is bruising a lot,she think she might need to be seen.

## 2013-09-26 NOTE — Telephone Encounter (Signed)
Returned call to patient. States she has bruising ("like someone hit me" and in "weird places") - of note a triage call from September 2014 was similar. Patient reports her legs are swollen in morning when she gets up and that once she uses bathroom she feels better. She states she feels like she is "drowning in her own fluid" and saw pulmonologist yesterday for this issue, of which she states that she was then told to see Dr. Claiborne Billings. She denies CP. She c/o occasional dizziness. Patient was last seen March 2014. RN informed her Dr. Claiborne Billings does not have openings til about Sept. 2015 and offered appmt on with PA/NP on day when Dr. Claiborne Billings is in office. Patient agreed with plan to see extender.   Message will be sent to scheduler to set up appmt with extender.

## 2013-09-29 ENCOUNTER — Telehealth: Payer: Self-pay | Admitting: Cardiovascular Disease

## 2013-09-29 NOTE — Telephone Encounter (Signed)
Would like to speak to you [place] .Marland Kitchen Please call

## 2013-09-29 NOTE — Telephone Encounter (Signed)
Returning your call. °

## 2013-09-30 ENCOUNTER — Other Ambulatory Visit: Payer: Self-pay | Admitting: Family Medicine

## 2013-10-02 ENCOUNTER — Other Ambulatory Visit: Payer: Self-pay | Admitting: *Deleted

## 2013-10-02 MED ORDER — CLOPIDOGREL BISULFATE 75 MG PO TABS: 75.0000 mg | ORAL_TABLET | Freq: Every day | ORAL | Status: DC

## 2013-10-02 NOTE — Telephone Encounter (Signed)
Returned a call to patient. She called to inform me that she has seen her PCP and her Pulmonologist for some sob, and sewlling issues. She is also experiencing some "bruisng." states that she has had this before and was told by Dr. Claiborne Billings to "take a extra fluid pill." she informs me that they both recommended for her to return to Dr. Claiborne Billings for evaluation. She has been trying to get appointment without success. Informed her that Dr. Evette Georges schedule is full until September unless he get a cancellation.we will be happy to get her scheduled with a extender. She is fine with this and will wait for Billy to call her back with appointment.

## 2013-10-02 NOTE — Telephone Encounter (Signed)
Returning your call. °

## 2013-10-03 ENCOUNTER — Other Ambulatory Visit: Payer: Self-pay

## 2013-10-03 ENCOUNTER — Ambulatory Visit (INDEPENDENT_AMBULATORY_CARE_PROVIDER_SITE_OTHER): Payer: Medicare Other | Admitting: Cardiology

## 2013-10-03 ENCOUNTER — Encounter: Payer: Self-pay | Admitting: Cardiology

## 2013-10-03 VITALS — BP 138/80 | HR 62 | Ht 64.0 in | Wt 150.0 lb

## 2013-10-03 DIAGNOSIS — R600 Localized edema: Secondary | ICD-10-CM | POA: Insufficient documentation

## 2013-10-03 DIAGNOSIS — R0989 Other specified symptoms and signs involving the circulatory and respiratory systems: Secondary | ICD-10-CM | POA: Diagnosis not present

## 2013-10-03 DIAGNOSIS — R0602 Shortness of breath: Secondary | ICD-10-CM | POA: Diagnosis not present

## 2013-10-03 DIAGNOSIS — I208 Other forms of angina pectoris: Secondary | ICD-10-CM

## 2013-10-03 DIAGNOSIS — R0609 Other forms of dyspnea: Secondary | ICD-10-CM | POA: Diagnosis not present

## 2013-10-03 DIAGNOSIS — R0600 Dyspnea, unspecified: Secondary | ICD-10-CM

## 2013-10-03 DIAGNOSIS — R609 Edema, unspecified: Secondary | ICD-10-CM

## 2013-10-03 DIAGNOSIS — I209 Angina pectoris, unspecified: Secondary | ICD-10-CM

## 2013-10-03 MED ORDER — SYNTHROID 125 MCG PO TABS: ORAL_TABLET | ORAL | Status: DC

## 2013-10-03 NOTE — Assessment & Plan Note (Signed)
Walking up the steps she became so significantly short of breath she had to slow down was not sure she could walk up the full flight. Concerning for angina.  We'll do LexiScan Myoview and then follow up with myself or with an extender in the office the same day as Dr. Claiborne Billings.

## 2013-10-03 NOTE — Progress Notes (Signed)
10/03/2013   PCP: [name], DO   Chief Complaint  Patient presents with  . Follow-up    intermittent chest pain/sob    Primary Cardiologist:Dr. Corky Downs   HPI: This 68 -year-old female is here today secondary to increasing shortness of breath at rest and dyspnea on exertion. She has a history of normal cardiac cath in 2004 and her last stress test in 2013 was normal. She saw pulmonary for the shortness of breath they did not believe this was from her lungs they've started her to come to see her cardiologist. Their concern was pulmonary hypertension or and diastolic dysfunction.  Her chest x-ray revealed no active cardiopulmonary disease. Stable hyperinflation and chronic mild bronchitic changes.  She is quite concerned about her symptoms.  She is unable to walk up a flight of steps without feeling as if she cannot make the last few steps due to significant shortness of breath. Sometimes in the morning she wakes up" drowning in fluid' is how she feels. She denies chest pain.  She is wearing support stockings as well as upper body support.  Allergies  Allergen Reactions  . Prednisone Other (See Comments)    Homicidal  . Hydrocodone Itching    Current Outpatient Prescriptions  Medication Sig Dispense Refill  . Cholecalciferol (VITAMIN D-3) 1000 UNITS CAPS 1 cap by mouth 2 times a day      . clopidogrel (PLAVIX) 75 MG tablet Take 1 tablet (75 mg total) by mouth daily. <please make appointment>  30 tablet  0  . dicyclomine (BENTYL) 20 MG tablet TAKE 1 TABLET (20 MG TOTAL) BY MOUTH 4 (FOUR) TIMES DAILY AS NEEDED.  120 tablet  1  . estradiol (ESTRACE) 2 MG tablet Take 1 tablet by mouth daily.       . furosemide (LASIX) 40 MG tablet 1 1/2 tabs daily      . lactulose (CHRONULAC) 10 GM/15ML solution TAKE 30 MLS (20 G TOTAL) BY MOUTH EVERY 6 (SIX) HOURS AS NEEDED.  1892 mL  1  . potassium chloride SA (KLOR-CON M20) 20 MEQ tablet Take 2 tablets (40 mEq total) by mouth  daily.  60 tablet  2  . SYNTHROID 125 MCG tablet TAKE 1 TABLET (125 MCG TOTAL) BY MOUTH DAILY.  30 tablet  11   No current facility-administered medications for this visit.    Past Medical History  Diagnosis Date  . Arthritis   . Diverticulitis   . Migraines   . DVT (deep venous thrombosis)     lower extremity  . Colon polyp   . Thyroid disease   . TIA (transient ischemic attack)     8 years ago  . Heart attack     10 years ago  . Heart murmur   . H/O cardiac catheterization 2004    Normal coronary arteries    Past Surgical History  Procedure Laterality Date  . Vaginal hysterectomy  10/17/1998    Dory Horn  . Breast biopsy      Isaiah Blakes  . Total knee arthroplasty      Bilateral x's 2  . Neck surgery      x's 2  . Cardiac catheterization      YHC:WCBJSEG:BT colds or fevers, no weight changes Skin:no rashes or ulcers HEENT:no blurred vision, no congestion CV:see HPI PUL:see HPI GI:no diarrhea constipation or melena, no indigestion GU:no hematuria, no dysuria MS:no joint pain, no claudication Neuro:no syncope, no lightheadedness Endo:no diabetes,  no thyroid disease  Wt Readings from Last 3 Encounters:  10/03/13 150 lb (68.04 kg)  09/25/13 151 lb 12.8 oz (68.856 kg)  09/22/13 148 lb (67.132 kg)    PHYSICAL EXAM BP 138/80  Pulse 62  Ht 5\' 4"  (1.626 m)  Wt 150 lb (68.04 kg)  BMI 25.73 kg/m2 General:Pleasant affect, NAD Skin:Warm and dry, brisk capillary refill HEENT:normocephalic, sclera clear, mucus membranes moist Neck:supple, no JVD, no bruits  Heart:S1S2 RRR without murmur, gallup, rub or click Lungs:clear without rales, rhonchi, or wheezes HKV:QQVZ, non tender, + BS, do not palpate liver spleen or masses DGL:OVFI lower ext edema, 2+ pedal pulses, 2+ radial pulses Neuro:alert and oriented, MAE, follows commands, + facial symmetry EKG: Sinus rhythm right bundle branch block new T changes otherwise heart rate at 62  ASSESSMENT AND PLAN SOB (shortness  of breath) Has woken up with shortness of breath also has lower extremity edema. She is on Lasix 60 mg daily will increase to 80 for 2 days and then back to 60 mg daily. Recent kidney function was stable she wear support stockings.  We'll do 2-D echo to evaluate LV function looking for diastolic dysfunction or pulmonary hypertension.  DOE (dyspnea on exertion) Walking up the steps she became so significantly short of breath she had to slow down was not sure she could walk up the full flight. Concerning for angina.  We'll do LexiScan Myoview and then follow up with myself or with an extender in the office the same day as Dr. Claiborne Billings.  Edema of both legs See above note

## 2013-10-03 NOTE — Assessment & Plan Note (Signed)
See above note

## 2013-10-03 NOTE — Assessment & Plan Note (Signed)
Has woken up with shortness of breath also has lower extremity edema. She is on Lasix 60 mg daily will increase to 80 for 2 days and then back to 60 mg daily. Recent kidney function was stable she wear support stockings.  We'll do 2-D echo to evaluate LV function looking for diastolic dysfunction or pulmonary hypertension.

## 2013-10-03 NOTE — Patient Instructions (Signed)
Your physician has requested that you have an echocardiogram. Echocardiography is a painless test that uses sound waves to create images of your heart. It provides your doctor with information about the size and shape of your heart and how well your heart's chambers and valves are working. This procedure takes approximately one hour. There are no restrictions for this procedure.  Your physician has requested that you have a lexiscan myoview. For further information please visit HugeFiesta[employer][email or web address]. Please follow instruction sheet, as given.  Increase your Lasix to 80mg  for two days then back to 60mg  daily  Your physician recommends that you schedule a follow-up appointment 2-3 weeks with Alexandria [name] or with Alexandria [name] on a day Dr.Kelly is in office.

## 2013-10-10 DIAGNOSIS — M25473 Effusion, unspecified ankle: Secondary | ICD-10-CM | POA: Diagnosis not present

## 2013-10-10 DIAGNOSIS — M25476 Effusion, unspecified foot: Secondary | ICD-10-CM | POA: Diagnosis not present

## 2013-10-11 ENCOUNTER — Telehealth (HOSPITAL_COMMUNITY): Payer: Self-pay

## 2013-10-13 ENCOUNTER — Ambulatory Visit (HOSPITAL_BASED_OUTPATIENT_CLINIC_OR_DEPARTMENT_OTHER)
Admission: RE | Admit: 2013-10-13 | Discharge: 2013-10-13 | Disposition: A | Discharge status: Home / Self Care | Payer: Medicare Other | Source: Ambulatory Visit | Attending: Cardiology | Admitting: Cardiology

## 2013-10-13 ENCOUNTER — Ambulatory Visit (HOSPITAL_COMMUNITY)
Admission: RE | Admit: 2013-10-13 | Discharge: 2013-10-13 | Disposition: A | Discharge status: Home / Self Care | Payer: Medicare Other | Source: Ambulatory Visit | Attending: Cardiology | Admitting: Cardiology

## 2013-10-13 DIAGNOSIS — R0609 Other forms of dyspnea: Secondary | ICD-10-CM | POA: Diagnosis not present

## 2013-10-13 DIAGNOSIS — I359 Nonrheumatic aortic valve disorder, unspecified: Secondary | ICD-10-CM

## 2013-10-13 DIAGNOSIS — R0600 Dyspnea, unspecified: Secondary | ICD-10-CM

## 2013-10-13 DIAGNOSIS — R0989 Other specified symptoms and signs involving the circulatory and respiratory systems: Secondary | ICD-10-CM | POA: Diagnosis not present

## 2013-10-13 DIAGNOSIS — I209 Angina pectoris, unspecified: Secondary | ICD-10-CM

## 2013-10-13 DIAGNOSIS — I208 Other forms of angina pectoris: Secondary | ICD-10-CM

## 2013-10-13 MED ORDER — TECHNETIUM TC 99M SESTAMIBI GENERIC - CARDIOLITE
30.6000 | Freq: Once | INTRAVENOUS | Status: AC | PRN
  Administered 2013-10-13: 31 via INTRAVENOUS

## 2013-10-13 MED ORDER — REGADENOSON 0.4 MG/5ML IV SOLN
0.4000 mg | Freq: Once | INTRAVENOUS | Status: AC
  Administered 2013-10-13: 0.4 mg via INTRAVENOUS

## 2013-10-13 MED ORDER — TECHNETIUM TC 99M SESTAMIBI GENERIC - CARDIOLITE
10.1000 | Freq: Once | INTRAVENOUS | Status: AC | PRN
  Administered 2013-10-13: 10.1 via INTRAVENOUS

## 2013-10-13 MED ORDER — AMINOPHYLLINE 25 MG/ML IV SOLN
150.0000 mg | Freq: Once | INTRAVENOUS | Status: AC
  Administered 2013-10-13: 150 mg via INTRAVENOUS

## 2013-10-13 NOTE — Procedures (Addendum)
[facility] [address] [place] [address] [phone]  Cardiology Nuclear Med Study  Alexandria [name] is a 68 y.o. female     MRN : [id number]     DOB: 1945/05/18  Procedure Date: 10/13/2013  Nuclear Med Background Indication for Stress Test:  Evaluation for Ischemia History:  CAD;MI-2005;murmur;DVT;Last NUC MPI on 05/21/2011-nonischemic;EF=77% Cardiac Risk Factors: Family History - CAD, History of Smoking, RBBB and TIA  Symptoms:  Chest Pain, Dizziness, DOE, Fatigue, Light-Headedness, Palpitations and SOB   Nuclear Pre-Procedure Caffeine/Decaff Intake:  7:00pm NPO After: 5:00am   IV Site: R Forearm  IV 0.9% NS with Angio Cath:  22g  Chest Size (in):  n/a IV Started by: Rolene Course, RN  Height: 5\' 4"  (1.626 m)  Cup Size: B  BMI:  Body mass index is 25.73 kg/(m^2). Weight:  150 lb (68.04 kg)   Tech Comments:  n/a    Nuclear Med Study 1 or 2 day study: 1 day  Stress Test Type:  [place] Provider:  Shelva Majestic, MD   Resting Radionuclide: Technetium 46m Sestamibi  Resting Radionuclide Dose: 10.1 mCi   Stress Radionuclide:  Technetium 57m Sestamibi  Stress Radionuclide Dose: 30.6 mCi           Stress Protocol Rest HR:50 Stress HR: 70  Rest BP: 145/83 Stress BP: 148/73  Exercise Time (min): n/a METS: n/a   Predicted Max HR: 152 bpm % Max HR: 47.37 bpm Rate Pressure Product: 10656  Dose of Adenosine (mg):  n/a Dose of Lexiscan: 0.4 mg  Dose of Atropine (mg): n/a Dose of Dobutamine: n/a mcg/kg/min (at max HR)  Stress Test Technologist: Leane Para, CCT Nuclear Technologist: Imagene Riches, CNMT   Rest Procedure:  Myocardial perfusion imaging was performed at rest 45 minutes following the intravenous administration of Technetium 63m Sestamibi. Stress Procedure:  The patient received IV Lexiscan 0.4 mg over 15-seconds.  Technetium 75m Sestamibi injected at 30-seconds.  Patient  experienced SOB, Tongue and hand numbness, inability to focus eyes and dizziness and 150 mg of Aminophylline IV was administered. There were no significant changes with Lexiscan.  Quantitative spect images were obtained after a 45 minute delay.  Transient Ischemic Dilatation (Normal <1.22):  1.06  QGS EDV:  82 ml QGS ESV:  28 ml LV Ejection Fraction: 66%        Rest ECG: RBBB  Stress ECG: No significant change from baseline ECG  QPS Raw Data Images:  Normal; no motion artifact; normal heart/lung ratio. Stress Images:  There is decreased uptake in the anterior wall. Rest Images:  Normal homogeneous uptake in all areas of the myocardium. Subtraction (SDS):  These findings are consistent with ischemia.  Impression Exercise Capacity:  Lexiscan with no exercise. BP Response:  Normal blood pressure response. Clinical Symptoms:  No significant symptoms noted. ECG Impression:  No significant ST segment change suggestive of ischemia. Comparison with Prior Nuclear Study: New ischemic changes compared to prior study  Overall Impression:  Intermediate risk stress nuclear study Septal and Anterolateral ischemia. Follow up with Dr. Claiborne Billings  LV Wall Motion:  NL LV Function; NL Wall Motion   Lorretta Harp, MD  10/13/2013 6:14 PM

## 2013-10-13 NOTE — Progress Notes (Signed)
2D Echocardiogram Complete.  10/13/2013   Axavier Pressley, RDCS  

## 2013-10-16 NOTE — Progress Notes (Signed)
Pt. Informed of her Echo and stress test  ; and has appt on 7/17 with Cecilie Kicks

## 2013-10-18 ENCOUNTER — Encounter: Payer: Self-pay | Admitting: *Deleted

## 2013-10-19 NOTE — Telephone Encounter (Signed)
Encounter complete. 

## 2013-10-20 ENCOUNTER — Encounter: Payer: Self-pay | Admitting: Cardiovascular Disease

## 2013-10-20 ENCOUNTER — Other Ambulatory Visit: Payer: Self-pay

## 2013-10-20 ENCOUNTER — Encounter: Payer: Self-pay | Admitting: Cardiology

## 2013-10-20 ENCOUNTER — Ambulatory Visit (INDEPENDENT_AMBULATORY_CARE_PROVIDER_SITE_OTHER): Payer: Medicare Other | Admitting: Cardiology

## 2013-10-20 VITALS — BP 122/72 | HR 68 | Ht 64.0 in | Wt 149.3 lb

## 2013-10-20 DIAGNOSIS — R9439 Abnormal result of other cardiovascular function study: Secondary | ICD-10-CM | POA: Diagnosis not present

## 2013-10-20 DIAGNOSIS — Z01818 Encounter for other preprocedural examination: Secondary | ICD-10-CM

## 2013-10-20 DIAGNOSIS — Z79899 Other long term (current) drug therapy: Secondary | ICD-10-CM

## 2013-10-20 DIAGNOSIS — R609 Edema, unspecified: Secondary | ICD-10-CM

## 2013-10-20 DIAGNOSIS — R0989 Other specified symptoms and signs involving the circulatory and respiratory systems: Secondary | ICD-10-CM

## 2013-10-20 DIAGNOSIS — I209 Angina pectoris, unspecified: Secondary | ICD-10-CM

## 2013-10-20 DIAGNOSIS — R079 Chest pain, unspecified: Secondary | ICD-10-CM

## 2013-10-20 DIAGNOSIS — D689 Coagulation defect, unspecified: Secondary | ICD-10-CM

## 2013-10-20 DIAGNOSIS — R0609 Other forms of dyspnea: Secondary | ICD-10-CM

## 2013-10-20 DIAGNOSIS — R600 Localized edema: Secondary | ICD-10-CM

## 2013-10-20 LAB — CBC
HEMATOCRIT: 38.8 % (ref 36.0–46.0)
Hemoglobin: 13.5 g/dL (ref 12.0–15.0)
MCH: 32.5 pg (ref 26.0–34.0)
MCHC: 34.8 g/dL (ref 30.0–36.0)
MCV: 93.3 fL (ref 78.0–100.0)
PLATELETS: 200 10*3/uL (ref 150–400)
RBC: 4.16 MIL/uL (ref 3.87–5.11)
RDW: 13.3 % (ref 11.5–15.5)
WBC: 4.6 10*3/uL (ref 4.0–10.5)

## 2013-10-20 NOTE — Assessment & Plan Note (Signed)
improved

## 2013-10-20 NOTE — Patient Instructions (Signed)
Cecilie Kicks, NP has requested that you have a cardiac catheterization on Wednesday, July 22 with Dr Corky Downs. Cardiac catheterization is used to diagnose and/or treat various heart conditions. Doctors may recommend this procedure for a number of different reasons. The most common reason is to evaluate chest pain. Chest pain can be a symptom of coronary artery disease (CAD), and cardiac catheterization can show whether plaque is narrowing or blocking your heart's arteries. This procedure is also used to evaluate the valves, as well as measure the blood flow and oxygen levels in different parts of your heart. For further information please visit [email or web address]. Please follow instruction sheet, as given.  Your physician has ordered you to have some blood work prior to your procedure. You can have blood work done today.

## 2013-10-20 NOTE — Progress Notes (Signed)
10/20/2013   PCP: [name], DO   Chief Complaint  Patient presents with  . Follow-up    follow up of tests    Primary Cardiologist:Dr. Corky Downs   HPI:  68 -year-old female is here for follow up of nuc test and echo-done secondary to increasing shortness of breath at rest and dyspnea on exertion. She has a history of normal cardiac cath in 2004 and her last stress test in 2013 was normal. She saw pulmonary for the shortness of breath they did not believe this was from her lungs they've started her to come to see her cardiologist. Their concern was pulmonary hypertension or and diastolic dysfunction.  Her chest x-ray revealed no active cardiopulmonary disease. Stable hyperinflation and chronic mild bronchitic changes.   She is quite concerned about her symptoms. She is unable to walk up a flight of steps without feeling as if she cannot make the last few steps due to significant shortness of breath. Sometimes in the morning she wakes up" drowning in fluid' is how she feels. She denies chest pain. She is wearing support stockings as well as upper body support. I added increased dose of lasix for 2 days and edema improved.  She still has episodes of SOB and with chest pain at times. She has started exercise on stationary bike going very slow. No pain or SOB though she goes very slow.      Allergies  Allergen Reactions  . Prednisone Other (See Comments)    Homicidal  . Hydrocodone Itching    Current Outpatient Prescriptions  Medication Sig Dispense Refill  . Cholecalciferol (VITAMIN D-3) 1000 UNITS CAPS 1 cap by mouth 2 times a day      . clopidogrel (PLAVIX) 75 MG tablet Take 1 tablet (75 mg total) by mouth daily. <please make appointment>  30 tablet  0  . dicyclomine (BENTYL) 20 MG tablet TAKE 1 TABLET (20 MG TOTAL) BY MOUTH 4 (FOUR) TIMES DAILY AS NEEDED.  120 tablet  1  . estradiol (ESTRACE) 2 MG tablet Take 1 tablet by mouth daily.       . furosemide (LASIX) 40  MG tablet 1 1/2 tabs daily      . lactulose (CHRONULAC) 10 GM/15ML solution TAKE 30 MLS (20 G TOTAL) BY MOUTH EVERY 6 (SIX) HOURS AS NEEDED.  1892 mL  1  . potassium chloride SA (KLOR-CON M20) 20 MEQ tablet Take 2 tablets (40 mEq total) by mouth daily.  60 tablet  2  . SYNTHROID 125 MCG tablet TAKE 1 TABLET (125 MCG TOTAL) BY MOUTH DAILY.  30 tablet  11   No current facility-administered medications for this visit.    Past Medical History  Diagnosis Date  . Arthritis   . Diverticulitis   . Migraines   . DVT (deep venous thrombosis)     lower extremity  . Colon polyp   . Thyroid disease   . TIA (transient ischemic attack)     8 years ago  . Heart attack     10 years ago  . Heart murmur   . H/O cardiac catheterization 2004    Normal coronary arteries  . Hx of echocardiogram 01/27/2010    Normal Ef 55% the transmitral spectral doppler flow pattern is normal for age. the left ventricular wall motion is normal  . History of stress test 05/21/2011    Past Surgical History  Procedure Laterality Date  . Vaginal hysterectomy  10/17/1998    Dewart biopsy      Isaiah Blakes  . Total knee arthroplasty      Bilateral x's 2  . Neck surgery      x's 2  . Cardiac catheterization      RXY:VOPFYTW:KM colds or fevers,  weight down 1 lb Skin:no rashes or ulcers HEENT:no blurred vision, no congestion CV:see HPI PUL:see HPI GI:no diarrhea constipation or melena, no indigestion GU:no hematuria, no dysuria MS:no joint pain, no claudication Neuro:no syncope, no lightheadedness Endo:no diabetes, no thyroid disease  Wt Readings from Last 3 Encounters:  10/20/13 149 lb 4.8 oz (67.722 kg)  10/13/13 150 lb (68.04 kg)  10/03/13 150 lb (68.04 kg)    PHYSICAL EXAM BP 122/72  Pulse 68  Ht 5\' 4"  (1.626 m)  Wt 149 lb 4.8 oz (67.722 kg)  BMI 25.61 kg/m2 General:Pleasant affect, NAD Skin:Warm and dry, brisk capillary refill HEENT:normocephalic, sclera clear, mucus membranes  moist Neck:supple, no JVD, no bruits  Heart:S1S2 RRR without murmur, gallup, rub or click Lungs:clear without rales, rhonchi, or wheezes QKM:MNOT, non tender, + BS, do not palpate liver spleen or masses Ext:no to trace lower ext edema, 2+ pedal pulses, 2+ radial pulses Neuro:alert and oriented, MAE, follows commands, + facial symmetry   ASSESSMENT AND PLAN DOE (dyspnea on exertion) Still present, at times she feels bad.  Her stress test with new septal and ant lat ischemia, on nuc 2004 she had apical abnormality but cath was without CAD, since that time nucs have been negative until now. Her symptoms are DOE and some chest pain.  She also had some lower ext edema that has improved with increase of lasix for 2 days.  Her Echo was with normal EF.  With  Recurrent symptoms of DOE and some pain and abnormal nuc study, and last cath 11 years ago, pt is agreeable to proceeding with cardiac cath.  She cannot do until Wed.  Did discuss 1% risk of death stroke or death with cardiac cath.    There was concern for coronary spasm 11 years ago, may be cause again vs. Developing CAD.  Edema of both legs improved

## 2013-10-20 NOTE — Assessment & Plan Note (Addendum)
Still present, at times she feels bad.  Her stress test with new septal and ant lat ischemia, on nuc 2004 she had apical abnormality but cath was without CAD, since that time nucs have been negative until now. Her symptoms are DOE and some chest pain.  She also had some lower ext edema that has improved with increase of lasix for 2 days.  Her Echo was with normal EF.  With  Recurrent symptoms of DOE and some pain and abnormal nuc study, and last cath 11 years ago, pt is agreeable to proceeding with cardiac cath.  She cannot do until Wed.  Did discuss 1% risk of death stroke or death with cardiac cath.    There was concern for coronary spasm 11 years ago, may be cause again vs. Developing CAD.

## 2013-10-21 LAB — BASIC METABOLIC PANEL
BUN: 10 mg/dL (ref 6–23)
CO2: 31 mEq/L (ref 19–32)
CREATININE: 0.84 mg/dL (ref 0.50–1.10)
Calcium: 9 mg/dL (ref 8.4–10.5)
Chloride: 98 mEq/L (ref 96–112)
Glucose, Bld: 84 mg/dL (ref 70–99)
Potassium: 3.4 mEq/L — ABNORMAL LOW (ref 3.5–5.3)
Sodium: 139 mEq/L (ref 135–145)

## 2013-10-21 LAB — PROTIME-INR
INR: 1.02 (ref <1.50)
Prothrombin Time: 13.4 seconds (ref 11.6–15.2)

## 2013-10-21 LAB — APTT: aPTT: 27 seconds (ref 24–37)

## 2013-10-23 ENCOUNTER — Encounter (HOSPITAL_COMMUNITY): Payer: Self-pay

## 2013-10-24 ENCOUNTER — Telehealth: Payer: Self-pay | Admitting: Cardiovascular Disease

## 2013-10-24 NOTE — Telephone Encounter (Signed)
New Prob    Pt has some questions regarding her cath procedure tomorrow. Please call.

## 2013-10-24 NOTE — Telephone Encounter (Signed)
Spoke with pt, directions to the [facility]	[place] discussed with the pt.

## 2013-10-25 ENCOUNTER — Encounter (HOSPITAL_COMMUNITY): Payer: Medicare Other

## 2013-10-25 ENCOUNTER — Encounter (HOSPITAL_COMMUNITY)
Admission: RE | Disposition: A | Discharge status: Home / Self Care | Payer: Self-pay | Source: Ambulatory Visit | Attending: Cardiovascular Disease

## 2013-10-25 ENCOUNTER — Ambulatory Visit (HOSPITAL_COMMUNITY)
Admission: RE | Admit: 2013-10-25 | Discharge: 2013-10-25 | Disposition: A | Discharge status: Home / Self Care | Payer: Medicare Other | Source: Ambulatory Visit | Attending: Cardiovascular Disease | Admitting: Cardiovascular Disease

## 2013-10-25 ENCOUNTER — Telehealth: Payer: Self-pay | Admitting: Cardiology

## 2013-10-25 ENCOUNTER — Ambulatory Visit (HOSPITAL_COMMUNITY): Payer: Medicare Other

## 2013-10-25 DIAGNOSIS — R0602 Shortness of breath: Secondary | ICD-10-CM | POA: Diagnosis not present

## 2013-10-25 DIAGNOSIS — Z7902 Long term (current) use of antithrombotics/antiplatelets: Secondary | ICD-10-CM | POA: Insufficient documentation

## 2013-10-25 DIAGNOSIS — R011 Cardiac murmur, unspecified: Secondary | ICD-10-CM | POA: Insufficient documentation

## 2013-10-25 DIAGNOSIS — Z8673 Personal history of transient ischemic attack (TIA), and cerebral infarction without residual deficits: Secondary | ICD-10-CM | POA: Diagnosis not present

## 2013-10-25 DIAGNOSIS — Z86718 Personal history of other venous thrombosis and embolism: Secondary | ICD-10-CM | POA: Diagnosis not present

## 2013-10-25 DIAGNOSIS — Z7982 Long term (current) use of aspirin: Secondary | ICD-10-CM | POA: Insufficient documentation

## 2013-10-25 DIAGNOSIS — R9439 Abnormal result of other cardiovascular function study: Secondary | ICD-10-CM

## 2013-10-25 DIAGNOSIS — R0609 Other forms of dyspnea: Secondary | ICD-10-CM

## 2013-10-25 DIAGNOSIS — Z79899 Other long term (current) drug therapy: Secondary | ICD-10-CM | POA: Diagnosis not present

## 2013-10-25 DIAGNOSIS — E079 Disorder of thyroid, unspecified: Secondary | ICD-10-CM | POA: Insufficient documentation

## 2013-10-25 DIAGNOSIS — R079 Chest pain, unspecified: Secondary | ICD-10-CM

## 2013-10-25 DIAGNOSIS — I252 Old myocardial infarction: Secondary | ICD-10-CM | POA: Insufficient documentation

## 2013-10-25 HISTORY — PX: LEFT HEART CATHETERIZATION WITH CORONARY ANGIOGRAM: SHX5451

## 2013-10-25 LAB — POTASSIUM: Potassium: 3.7 mEq/L (ref 3.7–5.3)

## 2013-10-25 SURGERY — LEFT HEART CATHETERIZATION WITH CORONARY ANGIOGRAM
Anesthesia: LOCAL

## 2013-10-25 MED ORDER — HEPARIN SODIUM (PORCINE) 1000 UNIT/ML IJ SOLN
INTRAMUSCULAR | Status: AC
  Filled 2013-10-25: qty 1

## 2013-10-25 MED ORDER — SODIUM CHLORIDE 0.9 % IV SOLN: INTRAVENOUS | Status: DC

## 2013-10-25 MED ORDER — LIDOCAINE HCL (PF) 1 % IJ SOLN
INTRAMUSCULAR | Status: AC
  Filled 2013-10-25: qty 30

## 2013-10-25 MED ORDER — FENTANYL CITRATE 0.05 MG/ML IJ SOLN
INTRAMUSCULAR | Status: AC
  Filled 2013-10-25: qty 2

## 2013-10-25 MED ORDER — ACETAMINOPHEN 325 MG PO TABS: 650.0000 mg | ORAL_TABLET | ORAL | Status: DC | PRN

## 2013-10-25 MED ORDER — ONDANSETRON HCL 4 MG/2ML IJ SOLN: 4.0000 mg | Freq: Four times a day (QID) | INTRAMUSCULAR | Status: DC | PRN

## 2013-10-25 MED ORDER — ASPIRIN 81 MG PO CHEW
81.0000 mg | CHEWABLE_TABLET | ORAL | Status: AC
  Administered 2013-10-25: 81 mg via ORAL

## 2013-10-25 MED ORDER — SODIUM CHLORIDE 0.9 % IJ SOLN: 3.0000 mL | INTRAMUSCULAR | Status: DC | PRN

## 2013-10-25 MED ORDER — MIDAZOLAM HCL 2 MG/2ML IJ SOLN
INTRAMUSCULAR | Status: AC
  Filled 2013-10-25: qty 2

## 2013-10-25 MED ORDER — MIDAZOLAM HCL 2 MG/2ML IJ SOLN
INTRAMUSCULAR | Status: AC
Start: 2013-10-25 — End: 2013-10-25
  Filled 2013-10-25: qty 2

## 2013-10-25 MED ORDER — VERAPAMIL HCL 2.5 MG/ML IV SOLN
INTRAVENOUS | Status: AC
  Filled 2013-10-25: qty 2

## 2013-10-25 MED ORDER — SODIUM CHLORIDE 0.9 % IV SOLN
INTRAVENOUS | Status: DC
  Administered 2013-10-25: 07:00:00 via INTRAVENOUS

## 2013-10-25 MED ORDER — ASPIRIN 81 MG PO CHEW
CHEWABLE_TABLET | ORAL | Status: AC
  Filled 2013-10-25: qty 1

## 2013-10-25 MED ORDER — NITROGLYCERIN 1 MG/10 ML FOR IR/CATH LAB
INTRA_ARTERIAL | Status: AC
  Filled 2013-10-25: qty 10

## 2013-10-25 NOTE — CV Procedure (Signed)
Alexandria [name] is a 68 y.o. female   [id number]  [id number] LOCATION:  FACILITY: [place] [facility]  PHYSICIAN: Troy Sine, MD, [facility] [birth year]   DATE OF PROCEDURE:  10/25/2013     CARDIAC CATHETERIZATION    HISTORY:  Ms. Alexandria [name] is a 67 year old female who had undergone cardiac catheterization in 2004.  At that time.  Catheterization revealed normal coronary arteries, but she did have an apical defect, concordant with a history of possible prior myocardial infarction, which may be contributed by coronary vasospasm.  She has been doing well on medical therapy.  There is remote history of TIA, for which he has been on aspirin and Plavix.  She recently has developed increasing episodes of shortness of breath.  She was evaluated in the office.  Nuclear perfusion study suggested intermediate risk with ischemia septally and anterolaterally.  She is now referred for definitive cardiac catheterization.   PROCEDURE:  Left heart catheterization via the right radial artery approach; coronary angiography, left ventriculography.  The patient was brought to the [facility] Cardiac cath lab in the postabsorptive state. The patient was initially premedicated with Versed  2 mg and fentanyl  50 mcg intravenously but due to continued anxiety received a total dose of 4 mg Versed and 100 mcg fetanyl. A right radial approach was utilized after an Allen's test verified adequate circulation. The right radial artery was punctured via the Seldinger technique, and a 6 [place] Glidesheath Slender was inserted without difficulty.  A radial cocktail consisting of Verapamil, IV nitroglycerin, and lidocaine was administered. Weight adjusted heparin was administered. A versicor wire was advanced into the ascending aorta. Diagnostic catheterization was done with 5 [place] JR 4 and JL 3.5 catheters. A 5 French pigtail catheter was used for left ventriculography. A TR radial band was applied for hemostasis. The patient left the  catheterization laboratory in stable condition.   HEMODYNAMICS:   Central Aorta: 116/58  Left Ventricle:  16/14  ANGIOGRAPHY:   The left main coronary artery was angiographically normal and trifurcated into the LAD, ramus intermediate and left circumflex coronary artery.   The LAD was angiographically normal and gave rise to severeal diagonal vessels and several septal perforating arteries. The vessel extended to the LV apex.   The Ramus Intermediate vessel was angiographically normal  The left circumflex coronary artery was anatomically normal and gave rise to one major bifurcating obtuse marginal branch.   The RCA was angiographically normal it gave rise to a large PDA and PLA vessel.   Left ventriculography revealed normal global LV contractility without focal segmental wall motion abnormalities. There was no evidence for mitral regurgitation.   The previously noted apical wall motion abnormality now appeared normal.  Ejection fraction was 60-65%.   Total contrast used: 55 cc  IMPRESSION:  Normal coronary arteries.  Normal left ventricular function.   RECOMMENDATION:  Continued medical therapy.   Troy Sine, MD, [facility] 10/25/2013 9:35 AM

## 2013-10-25 NOTE — H&P (View-Only) (Signed)
10/20/2013   PCP: [name], DO   Chief Complaint  Patient presents with  . Follow-up    follow up of tests    Primary Cardiologist:Dr. Corky Downs   HPI:  68 -year-old female is here for follow up of nuc test and echo-done secondary to increasing shortness of breath at rest and dyspnea on exertion. She has a history of normal cardiac cath in 2004 and her last stress test in 2013 was normal. She saw pulmonary for the shortness of breath they did not believe this was from her lungs they've started her to come to see her cardiologist. Their concern was pulmonary hypertension or and diastolic dysfunction.  Her chest x-ray revealed no active cardiopulmonary disease. Stable hyperinflation and chronic mild bronchitic changes.   She is quite concerned about her symptoms. She is unable to walk up a flight of steps without feeling as if she cannot make the last few steps due to significant shortness of breath. Sometimes in the morning she wakes up" drowning in fluid' is how she feels. She denies chest pain. She is wearing support stockings as well as upper body support. I added increased dose of lasix for 2 days and edema improved.  She still has episodes of SOB and with chest pain at times. She has started exercise on stationary bike going very slow. No pain or SOB though she goes very slow.      Allergies  Allergen Reactions  . Prednisone Other (See Comments)    Homicidal  . Hydrocodone Itching    Current Outpatient Prescriptions  Medication Sig Dispense Refill  . Cholecalciferol (VITAMIN D-3) 1000 UNITS CAPS 1 cap by mouth 2 times a day      . clopidogrel (PLAVIX) 75 MG tablet Take 1 tablet (75 mg total) by mouth daily. <please make appointment>  30 tablet  0  . dicyclomine (BENTYL) 20 MG tablet TAKE 1 TABLET (20 MG TOTAL) BY MOUTH 4 (FOUR) TIMES DAILY AS NEEDED.  120 tablet  1  . estradiol (ESTRACE) 2 MG tablet Take 1 tablet by mouth daily.       . furosemide (LASIX) 40  MG tablet 1 1/2 tabs daily      . lactulose (CHRONULAC) 10 GM/15ML solution TAKE 30 MLS (20 G TOTAL) BY MOUTH EVERY 6 (SIX) HOURS AS NEEDED.  1892 mL  1  . potassium chloride SA (KLOR-CON M20) 20 MEQ tablet Take 2 tablets (40 mEq total) by mouth daily.  60 tablet  2  . SYNTHROID 125 MCG tablet TAKE 1 TABLET (125 MCG TOTAL) BY MOUTH DAILY.  30 tablet  11   No current facility-administered medications for this visit.    Past Medical History  Diagnosis Date  . Arthritis   . Diverticulitis   . Migraines   . DVT (deep venous thrombosis)     lower extremity  . Colon polyp   . Thyroid disease   . TIA (transient ischemic attack)     8 years ago  . Heart attack     10 years ago  . Heart murmur   . H/O cardiac catheterization 2004    Normal coronary arteries  . Hx of echocardiogram 01/27/2010    Normal Ef 55% the transmitral spectral doppler flow pattern is normal for age. the left ventricular wall motion is normal  . History of stress test 05/21/2011    Past Surgical History  Procedure Laterality Date  . Vaginal hysterectomy  10/17/1998    Glasgow biopsy      Isaiah Blakes  . Total knee arthroplasty      Bilateral x's 2  . Neck surgery      x's 2  . Cardiac catheterization      ZOX:WRUEAVW:UJ colds or fevers,  weight down 1 lb Skin:no rashes or ulcers HEENT:no blurred vision, no congestion CV:see HPI PUL:see HPI GI:no diarrhea constipation or melena, no indigestion GU:no hematuria, no dysuria MS:no joint pain, no claudication Neuro:no syncope, no lightheadedness Endo:no diabetes, no thyroid disease  Wt Readings from Last 3 Encounters:  10/20/13 149 lb 4.8 oz (67.722 kg)  10/13/13 150 lb (68.04 kg)  10/03/13 150 lb (68.04 kg)    PHYSICAL EXAM BP 122/72  Pulse 68  Ht 5\' 4"  (1.626 m)  Wt 149 lb 4.8 oz (67.722 kg)  BMI 25.61 kg/m2 General:Pleasant affect, NAD Skin:Warm and dry, brisk capillary refill HEENT:normocephalic, sclera clear, mucus membranes  moist Neck:supple, no JVD, no bruits  Heart:S1S2 RRR without murmur, gallup, rub or click Lungs:clear without rales, rhonchi, or wheezes WJX:BJYN, non tender, + BS, do not palpate liver spleen or masses Ext:no to trace lower ext edema, 2+ pedal pulses, 2+ radial pulses Neuro:alert and oriented, MAE, follows commands, + facial symmetry   ASSESSMENT AND PLAN DOE (dyspnea on exertion) Still present, at times she feels bad.  Her stress test with new septal and ant lat ischemia, on nuc 2004 she had apical abnormality but cath was without CAD, since that time nucs have been negative until now. Her symptoms are DOE and some chest pain.  She also had some lower ext edema that has improved with increase of lasix for 2 days.  Her Echo was with normal EF.  With  Recurrent symptoms of DOE and some pain and abnormal nuc study, and last cath 11 years ago, pt is agreeable to proceeding with cardiac cath.  She cannot do until Wed.  Did discuss 1% risk of death stroke or death with cardiac cath.    There was concern for coronary spasm 11 years ago, may be cause again vs. Developing CAD.  Edema of both legs improved

## 2013-10-25 NOTE — Progress Notes (Signed)
Received pt from cath procedure alert and denies any pain.  Pt was able to tolerate food and fluids and void.

## 2013-10-25 NOTE — Progress Notes (Signed)
Discharge instruction given per MD order.  Pt  Ws able to verbalize understanding.  Pt denies any discomfort at this time.  Pt to car via wheelchair.

## 2013-10-25 NOTE — Interval H&P Note (Signed)
Cath Lab Visit (complete for each Cath Lab visit)  Clinical Evaluation Leading to the Procedure:   ACS: No.  Non-ACS:    Anginal Classification: CCS III  Anti-ischemic medical therapy: Minimal Therapy (1 class of medications)  Non-Invasive Test Results: Intermediate-risk stress test findings: cardiac mortality 1-3%/year  Prior CABG: No previous CABG      History and Physical Interval Note:  10/25/2013 8:40 AM  Alexandria [name]  has presented today for surgery, with the diagnosis of cp  The various methods of treatment have been discussed with the patient and family. After consideration of risks, benefits and other options for treatment, the patient has consented to  Procedure(s): LEFT HEART CATHETERIZATION WITH CORONARY ANGIOGRAM (N/A) as a surgical intervention .  The patient's history has been reviewed, patient examined, no change in status, stable for surgery.  I have reviewed the patient's chart and labs.  Questions were answered to the patient's satisfaction.     Kylah Maresh A

## 2013-10-25 NOTE — Discharge Instructions (Signed)
Radial Site Care Refer to this sheet in the next few weeks. These instructions provide you with information on caring for yourself after your procedure. Your caregiver may also give you more specific instructions. Your treatment has been planned according to current medical practices, but problems sometimes occur. Call your caregiver if you have any problems or questions after your procedure. HOME CARE INSTRUCTIONS  You may shower the day after the procedure.Remove the bandage (dressing) and gently wash the site with plain soap and water.Gently pat the site dry.  Do not apply powder or lotion to the site.  Do not submerge the affected site in water for 3 to 5 days.  Inspect the site at least twice daily.  Do not flex or bend the affected arm for 24 hours.  No lifting over 5 pounds (2.3 kg) for 5 days after your procedure.  Do not drive home if you are discharged the same day of the procedure. Have someone else drive you.  You may drive 24 hours after the procedure unless otherwise instructed by your caregiver.  Do not operate machinery or power tools for 24 hours.  A responsible adult should be with you for the first 24 hours after you arrive home. What to expect:  Any bruising will usually fade within 1 to 2 weeks.  Blood that collects in the tissue (hematoma) may be painful to the touch. It should usually decrease in size and tenderness within 1 to 2 weeks. SEEK IMMEDIATE MEDICAL CARE IF:  You have unusual pain at the radial site.  You have redness, warmth, swelling, or pain at the radial site.  You have drainage (other than a small amount of blood on the dressing).  You have chills.  You have a fever or persistent symptoms for more than 72 hours.  You have a fever and your symptoms suddenly get worse.  Your arm becomes pale, cool, tingly, or numb.  You have heavy bleeding from the site. Hold pressure on the site. Document Released: 04/25/2010 Document Revised:  06/15/2011 Document Reviewed: 04/25/2010 [facility] Patient Information 2015 [place], [place]. This information is not intended to replace advice given to you by your health care provider. Make sure you discuss any questions you have with your health care provider.  Weigh daily Call [phone] if weight climbs more than 3 pounds in a day or 5 pounds in a week. No salt to very little salt in your diet.  No more than 2000 mg in a day. Call if increased shortness of breath or increased swelling.  Follow up in 2 weeks.  If continued swelling see PCP for possible venous insuff. Causing the problem.  Support stockings may help, place first thing in the morning before you get out of bed.

## 2013-10-25 NOTE — Telephone Encounter (Signed)
Please call,pt is still having a lot of swelling. She is already taking 2 Lasix a day.

## 2013-10-26 ENCOUNTER — Ambulatory Visit: Payer: Medicare Other | Admitting: Cardiology

## 2013-10-26 ENCOUNTER — Other Ambulatory Visit: Payer: Self-pay | Admitting: *Deleted

## 2013-10-26 ENCOUNTER — Telehealth: Payer: Self-pay | Admitting: Family Medicine

## 2013-10-26 MED ORDER — CLOPIDOGREL BISULFATE 75 MG PO TABS: 75.0000 mg | ORAL_TABLET | Freq: Every day | ORAL | Status: DC

## 2013-10-26 NOTE — Telephone Encounter (Signed)
Received call from patient she stated she had a cardiac cath done yesterday and had a bad experience.Stated the nurse that was getting her ready for cath was laughing too much.Stated she was nervous and the laughing made her feel worse.Patient was reassured.Stated she wanted to ask Dr.Kelly why she is retaining fluid in her lower legs.Stated she is taking lasix 20 mg twice a day.Stated lasix does help but she wants to know why she retains fluid.Follow up cath appt scheduled with Cecilie Kicks NP 11/20/13 at 11:30 am.Advised Dr.Kelly out of office will ask Cecilie Kicks NP about retaining fluid and call her back.

## 2013-10-26 NOTE — Telephone Encounter (Signed)
Rx was sent to pharmacy electronically. 

## 2013-10-26 NOTE — Telephone Encounter (Signed)
Returned call to patient no answer.LMTC. 

## 2013-10-26 NOTE — Telephone Encounter (Signed)
Called and spoke with patient.  Pt stated that she was out driving when she received a phone call from a doctor's office regarding her labs from [place] and her furosemide.  She was under the impression that it was Dr. Nonda Lou office, but she was unsure.  Reviewed patient's chart and no note was found regarding this information.  The only note that reflected some of what patient was saying was back in 08/01/13, and patient said that it was not that far back. Pt was encouraged to call her cardiologist to check with them.  She stated okay.  No further questions or concerns voiced.

## 2013-10-26 NOTE — Telephone Encounter (Signed)
Returned call to patient spoke to Alexandria Kicks NP about retaining fluid in legs.She said she will review echo with Dr.Kelly again and will discuss results at your office visit with her 11/20/13.Advised to wear support stockings.

## 2013-10-26 NOTE — Telephone Encounter (Signed)
Patient called to speak with someone regarding her recent blood work. Please advise.

## 2013-10-27 ENCOUNTER — Other Ambulatory Visit: Payer: Self-pay | Admitting: Family Medicine

## 2013-11-08 ENCOUNTER — Ambulatory Visit (INDEPENDENT_AMBULATORY_CARE_PROVIDER_SITE_OTHER): Payer: Medicare Other | Admitting: Cardiology

## 2013-11-08 ENCOUNTER — Encounter: Payer: Self-pay | Admitting: Cardiology

## 2013-11-08 VITALS — BP 110/80 | HR 74 | Ht 64.0 in | Wt 146.6 lb

## 2013-11-08 DIAGNOSIS — R600 Localized edema: Secondary | ICD-10-CM

## 2013-11-08 DIAGNOSIS — R002 Palpitations: Secondary | ICD-10-CM | POA: Insufficient documentation

## 2013-11-08 DIAGNOSIS — R609 Edema, unspecified: Secondary | ICD-10-CM

## 2013-11-08 DIAGNOSIS — Z79899 Other long term (current) drug therapy: Secondary | ICD-10-CM | POA: Diagnosis not present

## 2013-11-08 DIAGNOSIS — R0609 Other forms of dyspnea: Secondary | ICD-10-CM | POA: Diagnosis not present

## 2013-11-08 DIAGNOSIS — R0989 Other specified symptoms and signs involving the circulatory and respiratory systems: Secondary | ICD-10-CM

## 2013-11-08 DIAGNOSIS — I209 Angina pectoris, unspecified: Secondary | ICD-10-CM | POA: Diagnosis not present

## 2013-11-08 NOTE — Assessment & Plan Note (Signed)
None on exam, we'll recheck basic metabolic panel as he has been hypokalemic with her diuretics.

## 2013-11-08 NOTE — Progress Notes (Signed)
11/08/2013   PCP: [name], DO   Chief Complaint  Patient presents with  . Follow-up    post catherization, pt c/o palpitation started yesterday, denied SOB chest pain    Primary Cardiologist:Dr. Corky Downs   HPI:  68 -year-old female is here today for followup after previously presenting  with increasing shortness of breath at rest and dyspnea on exertion. She has a history of normal cardiac cath in 2004 and her last stress test in 2013 was normal. She saw pulmonary for the shortness of breath they did not believe this was from her lungs they've recommended she go to to her cardiologist. Their concern was pulmonary hypertension or and diastolic dysfunction.  Her chest x-ray revealed no active cardiopulmonary disease. Stable hyperinflation and chronic mild bronchitic changes.  She had echocardiogram done with EF 55-60% and grade 1 diastolic dysfunction. Her aortic valve sclerosis without stenosis.  Her stress test was positive for ischemia and she underwent cardiac catheterization by Dr. Claiborne Billings which was reassuring with normal coronary arteries and normal LV function EF 60-65%.  Patient was still very concerned about lower extremity edema she is encouraged to wear support stockings in to see her primary care physician. She did continue on her Lasix.  She'll be followed at [facility] for venous insufficiency. Today she is here for followup with no specific complaints she mentioned to the nurse she had palpitations her heart beat was regular she has known right bundle branch block as well. Previously her potassium has been low since she is taking extra Lasix.  Otherwise she does not complain about chest pain or shortness of breath today.  She drove down from [facility] today for this appointment.     Allergies  Allergen Reactions  . Prednisone Other (See Comments)    Homicidal  . Hydrocodone Itching  . Motrin [Ibuprofen] Other (See Comments)    "Gives false reading in  blood"    Current Outpatient Prescriptions  Medication Sig Dispense Refill  . Ascorbic Acid (VITAMIN C PO) Take 1 tablet by mouth daily.      Marland Kitchen BREWERS YEAST PO Take 1 tablet by mouth 2 (two) times daily.      . clopidogrel (PLAVIX) 75 MG tablet Take 1 tablet (75 mg total) by mouth daily. <please make appointment>  30 tablet  11  . dicyclomine (BENTYL) 20 MG tablet Take 40 mg by mouth 2 (two) times daily.      Marland Kitchen ESTRADIOL PO Take 1 tablet by mouth daily.      . furosemide (LASIX) 40 MG tablet Take 60-80 mg by mouth daily. According to how patient feels      . KLOR-CON M20 20 MEQ tablet TAKE 2 TABLETS BY MOUTH EVERY DAY  60 tablet  2  . SYNTHROID 125 MCG tablet TAKE 1 TABLET (125 MCG TOTAL) BY MOUTH DAILY.  30 tablet  11  . vitamin D, CHOLECALCIFEROL, 400 UNITS tablet Take 400 Units by mouth daily.       No current facility-administered medications for this visit.    Past Medical History  Diagnosis Date  . Arthritis   . Diverticulitis   . Migraines   . DVT (deep venous thrombosis)     lower extremity  . Colon polyp   . Thyroid disease   . TIA (transient ischemic attack)     8 years ago  . Heart attack     10 years ago  . Heart murmur   .  H/O cardiac catheterization 2004    Normal coronary arteries  . Hx of echocardiogram 01/27/2010    Normal Ef 55% the transmitral spectral doppler flow pattern is normal for age. the left ventricular wall motion is normal  . History of stress test 05/21/2011    Past Surgical History  Procedure Laterality Date  . Vaginal hysterectomy  10/17/1998    Dory Horn  . Breast biopsy      Isaiah Blakes  . Total knee arthroplasty      Bilateral x's 2  . Neck surgery      x's 2  . Cardiac catheterization      10/2013    YQI:HKVQQVZ:DG colds or fevers, no weight changes Skin:no rashes or ulcers HEENT:no blurred vision, no congestion CV:see HPI PUL:see HPI GI:no diarrhea constipation or melena, no indigestion GU:no hematuria, no dysuria MS:no  joint pain, no claudication Neuro:no syncope, no lightheadedness Endo:no diabetes, no thyroid disease  Wt Readings from Last 3 Encounters:  11/08/13 146 lb 9.6 oz (66.497 kg)  10/25/13 147 lb (66.679 kg)  10/25/13 147 lb (66.679 kg)    PHYSICAL EXAM BP 110/80  Pulse 74  Ht 5\' 4"  (1.626 m)  Wt 146 lb 9.6 oz (66.497 kg)  BMI 25.15 kg/m2 General:Pleasant affect, NAD Skin:Warm and dry, brisk capillary refill HEENT:normocephalic, sclera clear, mucus membranes moist Neck:supple, no JVD, no bruits  Heart:S1S2 RRR without murmur, gallup, rub or click Lungs:clear without rales, rhonchi, or wheezes LOV:FIEP, non tender, + BS, do not palpate liver spleen or masses Ext:no lower ext edema, 2+ pedal pulses, 2+ radial pulses, + varicosities, support stockings in place.  Neuro:alert and oriented, MAE, follows commands, + facial symmetry   ASSESSMENT AND PLAN DOE (dyspnea on exertion) No complaints of dyspnea today, recent positive stress test undergoing cardiac catheterization with normal coronary arteries and normal LV function. On her echocardiogram she did have type I diastolic dysfunction.  Edema of both legs Felt to be varicosities causing edema. She'll be followed at [facility] for further management of the varicosities.  Palpitations None on exam, we'll recheck basic metabolic panel as he has been hypokalemic with her diuretics.    She will follow with Dr. Claiborne Billings in 6 months.

## 2013-11-08 NOTE — Patient Instructions (Signed)
Your physician recommends that you schedule a follow-up appointment in:6 months with [facility]  Your physician recommends that you have lab work drawn today after our appointment

## 2013-11-08 NOTE — Assessment & Plan Note (Signed)
Felt to be varicosities causing edema. She'll be followed at [facility] for further management of the varicosities.

## 2013-11-08 NOTE — Assessment & Plan Note (Signed)
No complaints of dyspnea today, recent positive stress test undergoing cardiac catheterization with normal coronary arteries and normal LV function. On her echocardiogram she did have type I diastolic dysfunction.

## 2013-11-09 ENCOUNTER — Telehealth: Payer: Self-pay | Admitting: Cardiovascular Disease

## 2013-11-09 ENCOUNTER — Telehealth: Payer: Self-pay | Admitting: Cardiology

## 2013-11-09 LAB — BASIC METABOLIC PANEL
BUN: 10 mg/dL (ref 6–23)
CALCIUM: 9 mg/dL (ref 8.4–10.5)
CO2: 30 mEq/L (ref 19–32)
Chloride: 98 mEq/L (ref 96–112)
Creat: 0.79 mg/dL (ref 0.50–1.10)
GLUCOSE: 90 mg/dL (ref 70–99)
Potassium: 3.4 mEq/L — ABNORMAL LOW (ref 3.5–5.3)
Sodium: 138 mEq/L (ref 135–145)

## 2013-11-09 MED ORDER — FUROSEMIDE 40 MG PO TABS: 60.0000 mg | ORAL_TABLET | Freq: Every day | ORAL | Status: DC

## 2013-11-09 NOTE — Telephone Encounter (Signed)
Patient is returning call from triage.

## 2013-11-09 NOTE — Telephone Encounter (Signed)
Ms. Alexandria [name] called in stating that she needs her rx for Furosemide to refilled because she is running out and she also stated the directions have changed to 1-2 tabs po qod per Mickel Baas.   Thanks

## 2013-11-09 NOTE — Telephone Encounter (Signed)
Prescription was electronically send to [facility]

## 2013-11-09 NOTE — Telephone Encounter (Signed)
Spoke with pt, she saw laura yesterday and she has an appt to see [facility] regarding her left leg. She is uncomfortable with them and wants to know who laura would recommend. Will forward for laura review

## 2013-11-09 NOTE — Telephone Encounter (Signed)
Left message for pt to call.

## 2013-11-09 NOTE — Telephone Encounter (Signed)
Pt would like you to recommend a new vein doctor for her.

## 2013-11-10 ENCOUNTER — Encounter: Payer: Self-pay | Admitting: Cardiovascular Disease

## 2013-11-10 ENCOUNTER — Other Ambulatory Visit: Payer: Self-pay | Admitting: *Deleted

## 2013-11-10 MED ORDER — FUROSEMIDE 40 MG PO TABS: 40.0000 mg | ORAL_TABLET | Freq: Two times a day (BID) | ORAL | Status: DC

## 2013-11-10 MED ORDER — POTASSIUM CHLORIDE CRYS ER 20 MEQ PO TBCR: 20.0000 meq | EXTENDED_RELEASE_TABLET | Freq: Three times a day (TID) | ORAL | Status: DC

## 2013-11-10 NOTE — Telephone Encounter (Signed)
Rx refills sent in due to medication changes per Cecilie Kicks.

## 2013-11-13 NOTE — Telephone Encounter (Signed)
Spoke with pt, given the name and telephone number of [place] [facility] specialist

## 2013-11-20 ENCOUNTER — Ambulatory Visit: Payer: Medicare Other | Admitting: Cardiology

## 2013-11-20 DIAGNOSIS — I8310 Varicose veins of unspecified lower extremity with inflammation: Secondary | ICD-10-CM | POA: Diagnosis not present

## 2013-11-20 DIAGNOSIS — M79609 Pain in unspecified limb: Secondary | ICD-10-CM | POA: Diagnosis not present

## 2013-11-21 DIAGNOSIS — I8310 Varicose veins of unspecified lower extremity with inflammation: Secondary | ICD-10-CM | POA: Diagnosis not present

## 2013-11-21 DIAGNOSIS — M79609 Pain in unspecified limb: Secondary | ICD-10-CM | POA: Diagnosis not present

## 2013-11-23 DIAGNOSIS — M79609 Pain in unspecified limb: Secondary | ICD-10-CM | POA: Diagnosis not present

## 2013-11-23 DIAGNOSIS — I8310 Varicose veins of unspecified lower extremity with inflammation: Secondary | ICD-10-CM | POA: Diagnosis not present

## 2013-12-03 ENCOUNTER — Other Ambulatory Visit: Payer: Self-pay | Admitting: Family Medicine

## 2014-01-01 ENCOUNTER — Telehealth: Payer: Self-pay | Admitting: Internal Medicine

## 2014-01-01 ENCOUNTER — Encounter: Payer: Self-pay | Admitting: Internal Medicine

## 2014-01-01 ENCOUNTER — Ambulatory Visit (HOSPITAL_BASED_OUTPATIENT_CLINIC_OR_DEPARTMENT_OTHER)
Admission: RE | Admit: 2014-01-01 | Discharge: 2014-01-01 | Disposition: A | Discharge status: Home / Self Care | Payer: Medicare Other | Source: Ambulatory Visit | Attending: Internal Medicine | Admitting: Internal Medicine

## 2014-01-01 ENCOUNTER — Telehealth: Payer: Self-pay | Admitting: Family Medicine

## 2014-01-01 ENCOUNTER — Ambulatory Visit (INDEPENDENT_AMBULATORY_CARE_PROVIDER_SITE_OTHER): Payer: Medicare Other | Admitting: Internal Medicine

## 2014-01-01 VITALS — BP 110/62 | HR 64 | Temp 98.1°F | Wt 147.4 lb

## 2014-01-01 DIAGNOSIS — S60221A Contusion of right hand, initial encounter: Secondary | ICD-10-CM

## 2014-01-01 DIAGNOSIS — S298XXA Other specified injuries of thorax, initial encounter: Secondary | ICD-10-CM | POA: Diagnosis not present

## 2014-01-01 DIAGNOSIS — W19XXXA Unspecified fall, initial encounter: Secondary | ICD-10-CM | POA: Insufficient documentation

## 2014-01-01 DIAGNOSIS — M79609 Pain in unspecified limb: Secondary | ICD-10-CM | POA: Diagnosis not present

## 2014-01-01 DIAGNOSIS — S20219A Contusion of unspecified front wall of thorax, initial encounter: Secondary | ICD-10-CM | POA: Diagnosis not present

## 2014-01-01 DIAGNOSIS — M19049 Primary osteoarthritis, unspecified hand: Secondary | ICD-10-CM | POA: Insufficient documentation

## 2014-01-01 DIAGNOSIS — I209 Angina pectoris, unspecified: Secondary | ICD-10-CM | POA: Diagnosis not present

## 2014-01-01 DIAGNOSIS — S60229A Contusion of unspecified hand, initial encounter: Secondary | ICD-10-CM | POA: Insufficient documentation

## 2014-01-01 DIAGNOSIS — S6990XA Unspecified injury of unspecified wrist, hand and finger(s), initial encounter: Secondary | ICD-10-CM | POA: Diagnosis not present

## 2014-01-01 NOTE — Telephone Encounter (Signed)
X-rays negative, patient aware. Plan is the same

## 2014-01-01 NOTE — Progress Notes (Signed)
Pre visit review using our clinic review tool, if applicable. No additional management support is needed unless otherwise documented below in the visit note. 

## 2014-01-01 NOTE — Patient Instructions (Signed)
Tylenol as needed for pain  Stop by the first floor and get the XR    Call if not improving in the next few days Call if the ringing in the ears is not gone by next week Call if you have any nausea, vomiting or headache

## 2014-01-01 NOTE — Telephone Encounter (Signed)
Patient Information:  Caller Name: Stacy  Phone: [phone]  Patient: Alexandria [name], Alexandria [name]  Gender: Female  DOB: 05/10/1945  Age: 68 Years  PCP: Rosalita Chessman.  Office Follow Up:  Does the office need to follow up with this patient?: No  Instructions For The Office: N/A  RN Note:  RN discussed 911 parameters.  Symptoms  Reason For Call & Symptoms: Today, 01/01/2014, pt calling stating she fell 12/30/2013 . She was pumping gas and slipped on the pavement and fell over the gas hose onto her chest with  left arm on her chest . She has pain in sternum with breathing. Denies trouble breathing, but has pain with deep breaths or singing. Pt also mentions that 12/21/2013 she was setting up for singing and metal riser hit her head. Her hairdresser mentioned seeing scalp bruising.No  headache. Pt does take Plavix.  Go to ED Now (or to Office with PCP Approval)disposition reached per Chest  Injury and Head injury protocol. OV scheduled vs sending to ED since OV is open.     Reviewed Health History In EMR: Yes  Reviewed Medications In EMR: Yes  Reviewed Allergies In EMR: Yes  Reviewed Surgeries / Procedures: Yes  Date of Onset of Symptoms: 12/30/2013  Treatments Tried: Advil 200mg   Treatments Tried Worked: Yes  Guideline(s) Used:  Chest Injury  Head Injury  Disposition Per Guideline:   Go to ED Now (or to Office with PCP Approval)  Reason For Disposition Reached:   Taking Coumadin (warfarin), Pradaxa (dabigatran), or known bleeding disorder (e.g., thrombocytopenia)  Advice Given:  Call Back If:  Severe headache  Extremity weakness or numbness occurs  Slurred speech or blurred vision occurs  You become worse.  RN Overrode Recommendation:  Make Appointment  OV  vs ED since office is open  Appointment Scheduled:  01/01/2014 11:15:00 Appointment Scheduled Provider:  Kathlene November

## 2014-01-01 NOTE — Telephone Encounter (Signed)
Please advise 

## 2014-01-01 NOTE — Telephone Encounter (Signed)
Appointment today with Dr. Larose Kells noted.

## 2014-01-01 NOTE — Telephone Encounter (Signed)
Caller name: Jacinta  Relation to pt: self  Call back number: [phone]   Reason for call:   inquiring about x ray results  01/01/14

## 2014-01-01 NOTE — Progress Notes (Signed)
Subjective:    Patient ID: Alexandria [name], female    DOB: 1945-12-21, 68 y.o.   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 She did have a hematoma which since then has resolved.  Also on 12/30/2013 she was filling her car w/ gas, it was raining, she tripped and  fell forward, landed on her right hand and subsequently on her chest. No headache, neck -back injury, no loss of consciousness. Since then is having right hand and sternum pain  ROS  Denies nausea vomiting No diplopia or dizziness Some tinnitus which is not a new symptom and she has it periodically.  Past Medical History  Diagnosis Date  . Arthritis   . Diverticulitis   . Migraines   . DVT (deep venous thrombosis)     lower extremity  . Colon polyp   . Thyroid disease   . TIA (transient ischemic attack)     8 years ago  . Heart attack     10 years ago  . Heart murmur   . H/O cardiac catheterization 2004    Normal coronary arteries  . Hx of echocardiogram 01/27/2010    Normal Ef 55% the transmitral spectral doppler flow pattern is normal for age. the left ventricular wall motion is normal  . History of stress test 05/21/2011    Past Surgical History  Procedure Laterality Date  . Vaginal hysterectomy  10/17/1998    Dory Horn  . Breast biopsy      Isaiah Blakes  . Total knee arthroplasty      Bilateral x's 2  . Neck surgery      x's 2  . Cardiac catheterization      10/2013    History   Social History  . Marital Status: Married    Spouse Name: N/A    Number of Children: 3  . Years of Education: N/A   Occupational History  . DISABLED    Social History Main Topics  . Smoking status: Former Smoker -- 2 years    Quit date: 04/06/1941  . Smokeless tobacco: Never Used   Comment: socially smoked x 2 years  . Alcohol Use: No  . Drug Use: No  . Sexual Activity: Not on file   Other Topics Concern  . Not on file   Social History Narrative  . No narrative on file        Medication List       This list is accurate as of: 01/01/14  6:15 PM.  Always use your most recent med list.               BREWERS YEAST PO  Take 2 tablets by mouth 2 (two) times daily.     clopidogrel 75 MG tablet  Commonly known as:  PLAVIX  Take 1 tablet (75 mg total) by mouth daily. <please make appointment>     dicyclomine 20 MG tablet  Commonly known as:  BENTYL  TAKE 1 TABLET (20 MG TOTAL) BY MOUTH 4 (FOUR) TIMES DAILY AS NEEDED.     ESTRADIOL PO  Take 1 tablet by mouth daily.     furosemide 40 MG tablet  Commonly known as:  LASIX  Take 1 tablet (40 mg total) by mouth 2 (two) times daily.  According to how patient feels     potassium chloride SA 20 MEQ tablet  Commonly known as:  KLOR-CON M20  Take 1 tablet (20 mEq total) by mouth 3 (three) times daily.     SYNTHROID 125 MCG tablet  Generic drug:  levothyroxine  TAKE 1 TABLET (125 MCG TOTAL) BY MOUTH DAILY.     VITAMIN C PO  Take 2 tablets by mouth daily.     vitamin D (CHOLECALCIFEROL) 400 UNITS tablet  Take 400 Units by mouth 2 (two) times daily.           Objective:   Physical Exam  Musculoskeletal:       Arms:  BP 110/62  Pulse 64  Temp(Src) 98.1 F (36.7 C) (Oral)  Wt 147 lb 6 oz (66.849 kg)  SpO2 98%  General -- alert, well-developed, NAD.  Neck --ROM slt limited not a new thinh per pt, no TTP HEENT-- Not pale.  Forehead and scalp without ecchymoses, deformity  Lungs -- normal respiratory effort, no intercostal retractions, no accessory muscle use, and normal breath sounds.  Heart-- normal rate, regular rhythm, no murmur.   Extremities-- no pretibial edema bilaterally ; Left hand normal, right hand with some mild ecchymoses proximal from the 4-5th finger. No deformities. Neurologic--   alert & oriented X3. Speech normal, gait appropriate for age, strength symmetric and appropriate for age.    Psych-- Cognition and judgment appear intact. Cooperative with normal attention span and concentration. No anxious or depressed appearing.       Assessment & Plan:    68 year old lady with a number of medical problems had 2 accidents recently, neither accident was caused by presyncope or syncope. Currently the most bothersome symptom is the sternum pain, she also has some  Tenderness at the R hand. I'm not sure if the tinnitus is related to a mild head injury she can't be her Plan: X-rays Tylenol - rest Call if she has more problems, see instructions

## 2014-01-30 ENCOUNTER — Other Ambulatory Visit: Payer: Self-pay

## 2014-01-30 MED ORDER — DICYCLOMINE HCL 20 MG PO TABS: ORAL_TABLET | ORAL | Status: DC

## 2014-02-01 ENCOUNTER — Other Ambulatory Visit: Payer: Self-pay | Admitting: Family Medicine

## 2014-02-19 ENCOUNTER — Encounter: Payer: Self-pay | Admitting: Cardiovascular Disease

## 2014-03-06 ENCOUNTER — Ambulatory Visit (INDEPENDENT_AMBULATORY_CARE_PROVIDER_SITE_OTHER): Payer: Medicare Other | Admitting: Family Medicine

## 2014-03-06 ENCOUNTER — Encounter: Payer: Self-pay | Admitting: Family Medicine

## 2014-03-06 VITALS — BP 130/80 | HR 74 | Temp 98.2°F | Wt 148.6 lb

## 2014-03-06 DIAGNOSIS — K5732 Diverticulitis of large intestine without perforation or abscess without bleeding: Secondary | ICD-10-CM | POA: Diagnosis not present

## 2014-03-06 DIAGNOSIS — I208 Other forms of angina pectoris: Secondary | ICD-10-CM

## 2014-03-06 DIAGNOSIS — H9313 Tinnitus, bilateral: Secondary | ICD-10-CM | POA: Diagnosis not present

## 2014-03-06 MED ORDER — CIPROFLOXACIN HCL 500 MG PO TABS: 500.0000 mg | ORAL_TABLET | Freq: Two times a day (BID) | ORAL | Status: DC

## 2014-03-06 MED ORDER — METRONIDAZOLE 500 MG PO TABS: 500.0000 mg | ORAL_TABLET | Freq: Three times a day (TID) | ORAL | Status: DC

## 2014-03-06 NOTE — Patient Instructions (Signed)

## 2014-03-06 NOTE — Progress Notes (Signed)
Pre visit review using our clinic review tool, if applicable. No additional management support is needed unless otherwise documented below in the visit note. 

## 2014-03-06 NOTE — Progress Notes (Signed)
  Subjective:     Alexandria [name] is a 68 y.o. female who presents for evaluation of abdominal pain. Onset was 4 weeks ago. Symptoms have been gradually worsening. The pain is described as cramping and sharp, and is 7/10 in intensity. Pain is located in the entire abd without radiation.  Aggravating factors: activity and eating.  Alleviating factors: sitting and drinking water. Associated symptoms: chills, constipation and diarrhea. The patient denies arthralagias, belching, chills, dysuria, fever, flatus, frequency, headache, nausea and vomiting. Pt also c/o tinnitis b/l for 10 years Pt also c/o pain in R thenar emminence  The patient's history has been marked as reviewed and updated as appropriate.  Review of Systems Pertinent items are noted in HPI.     Objective:    BP 130/80 mmHg  Pulse 74  Temp(Src) 98.2 F (36.8 C) (Oral)  Wt 148 lb 9.6 oz (67.405 kg)  SpO2 98% General appearance: alert, cooperative, appears stated age and no distress Neck: no adenopathy, no carotid bruit, no JVD, supple, symmetrical, trachea midline and thyroid not enlarged, symmetric, no tenderness/mass/nodules Lungs: clear to auscultation bilaterally Heart: regular rate and rhythm, S1, S2 normal, no murmur, click, rub or gallop Abdomen: abnormal findings:  moderate tenderness in the LLQ Extremities: extremities normal, atraumatic, no cyanosis or edema    Assessment:    Abdominal pain, likely secondary to diverticulitis.    Plan:     1. 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 Diverticulitis of large intestine without perforation or abscess without bleeding Clinically presents like diverticulitis--- get CT and start abx Go to ER if symptoms worsen - ciprofloxacin (CIPRO) 500 MG tablet; Take 1 tablet (500 mg total) by mouth 2 (two) times daily.  Dispense: 20 tablet; Refill: 0 - metroNIDAZOLE (FLAGYL) 500 MG tablet; Take 1 tablet (500 mg total) by  mouth 3 (three) times daily.  Dispense: 21 tablet; Refill: 0 - CT Abdomen Pelvis W Contrast; Future - Basic metabolic panel - CBC with Differential - Hepatic function panel

## 2014-03-07 ENCOUNTER — Telehealth: Payer: Self-pay | Admitting: Family Medicine

## 2014-03-07 ENCOUNTER — Other Ambulatory Visit: Payer: Medicare Other

## 2014-03-07 LAB — CBC WITH DIFFERENTIAL/PLATELET
BASOS ABS: 0 10*3/uL (ref 0.0–0.1)
Basophils Relative: 0.4 % (ref 0.0–3.0)
EOS PCT: 3.2 % (ref 0.0–5.0)
Eosinophils Absolute: 0.2 10*3/uL (ref 0.0–0.7)
HCT: 40.1 % (ref 36.0–46.0)
Hemoglobin: 13.5 g/dL (ref 12.0–15.0)
LYMPHS PCT: 37.3 % (ref 12.0–46.0)
Lymphs Abs: 1.8 10*3/uL (ref 0.7–4.0)
MCHC: 33.7 g/dL (ref 30.0–36.0)
MCV: 96.2 fl (ref 78.0–100.0)
MONOS PCT: 15.4 % — AB (ref 3.0–12.0)
Monocytes Absolute: 0.7 10*3/uL (ref 0.1–1.0)
NEUTROS ABS: 2.1 10*3/uL (ref 1.4–7.7)
Neutrophils Relative %: 43.7 % (ref 43.0–77.0)
Platelets: 202 10*3/uL (ref 150.0–400.0)
RBC: 4.17 Mil/uL (ref 3.87–5.11)
RDW: 13.3 % (ref 11.5–15.5)
WBC: 4.7 10*3/uL (ref 4.0–10.5)

## 2014-03-07 LAB — BASIC METABOLIC PANEL
BUN: 16 mg/dL (ref 6–23)
CALCIUM: 8.9 mg/dL (ref 8.4–10.5)
CO2: 31 mEq/L (ref 19–32)
Chloride: 101 mEq/L (ref 96–112)
Creatinine, Ser: 0.9 mg/dL (ref 0.4–1.2)
GFR: 69.49 mL/min (ref >60.00)
Glucose, Bld: 104 mg/dL — ABNORMAL HIGH (ref 70–99)
POTASSIUM: 3.4 meq/L — AB (ref 3.5–5.1)
SODIUM: 138 meq/L (ref 135–145)

## 2014-03-07 NOTE — Telephone Encounter (Signed)
Error/gd °

## 2014-03-08 LAB — HEPATIC FUNCTION PANEL
ALBUMIN: 3.8 g/dL (ref 3.5–5.2)
ALK PHOS: 61 U/L (ref 39–117)
ALT: 20 U/L (ref 0–35)
AST: 24 U/L (ref 0–37)
Bilirubin, Direct: 0 mg/dL (ref 0.0–0.3)
Total Bilirubin: 0.5 mg/dL (ref 0.2–1.2)
Total Protein: 6.8 g/dL (ref 6.0–8.3)

## 2014-03-09 ENCOUNTER — Ambulatory Visit (HOSPITAL_BASED_OUTPATIENT_CLINIC_OR_DEPARTMENT_OTHER)
Admission: RE | Admit: 2014-03-09 | Discharge: 2014-03-09 | Disposition: A | Discharge status: Home / Self Care | Payer: Medicare Other | Source: Ambulatory Visit | Attending: Family Medicine | Admitting: Family Medicine

## 2014-03-09 DIAGNOSIS — R109 Unspecified abdominal pain: Secondary | ICD-10-CM | POA: Diagnosis not present

## 2014-03-09 DIAGNOSIS — K5732 Diverticulitis of large intestine without perforation or abscess without bleeding: Secondary | ICD-10-CM | POA: Diagnosis not present

## 2014-03-09 DIAGNOSIS — K5730 Diverticulosis of large intestine without perforation or abscess without bleeding: Secondary | ICD-10-CM | POA: Diagnosis not present

## 2014-03-09 DIAGNOSIS — M5135 Other intervertebral disc degeneration, thoracolumbar region: Secondary | ICD-10-CM | POA: Diagnosis not present

## 2014-03-09 MED ORDER — IOHEXOL 300 MG/ML  SOLN
100.0000 mL | Freq: Once | INTRAMUSCULAR | Status: AC | PRN
  Administered 2014-03-09: 100 mL via INTRAVENOUS

## 2014-03-12 ENCOUNTER — Telehealth: Payer: Self-pay | Admitting: Family Medicine

## 2014-03-12 NOTE — Telephone Encounter (Signed)
Caller name:  Torri Relation to pt: self Call back number: [phone] Pharmacy:  Reason for call:   Patient requesting CT results

## 2014-03-12 NOTE — Telephone Encounter (Signed)
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      KP

## 2014-03-14 DIAGNOSIS — H9313 Tinnitus, bilateral: Secondary | ICD-10-CM | POA: Diagnosis not present

## 2014-03-14 DIAGNOSIS — H903 Sensorineural hearing loss, bilateral: Secondary | ICD-10-CM | POA: Diagnosis not present

## 2014-03-15 ENCOUNTER — Encounter (HOSPITAL_COMMUNITY): Payer: Self-pay | Admitting: Cardiovascular Disease

## 2014-04-29 ENCOUNTER — Other Ambulatory Visit: Payer: Self-pay | Admitting: Family Medicine

## 2014-04-30 ENCOUNTER — Encounter: Payer: Self-pay | Admitting: Family Medicine

## 2014-04-30 ENCOUNTER — Ambulatory Visit (INDEPENDENT_AMBULATORY_CARE_PROVIDER_SITE_OTHER): Payer: Medicare Other | Admitting: Family Medicine

## 2014-04-30 VITALS — BP 120/78 | HR 72 | Temp 97.8°F | Wt 148.2 lb

## 2014-04-30 DIAGNOSIS — J0110 Acute frontal sinusitis, unspecified: Secondary | ICD-10-CM

## 2014-04-30 DIAGNOSIS — M654 Radial styloid tenosynovitis [de Quervain]: Secondary | ICD-10-CM

## 2014-04-30 MED ORDER — AMOXICILLIN-POT CLAVULANATE 875-125 MG PO TABS: 1.0000 | ORAL_TABLET | Freq: Two times a day (BID) | ORAL | Status: DC

## 2014-04-30 NOTE — Progress Notes (Signed)
Pre visit review using our clinic review tool, if applicable. No additional management support is needed unless otherwise documented below in the visit note. 

## 2014-04-30 NOTE — Patient Instructions (Signed)

## 2014-04-30 NOTE — Progress Notes (Signed)
  Subjective:     Alexandria [name] is a 69 y.o. female who presents for evaluation of sinus pain. Symptoms include: congestion, headaches, nasal congestion, post nasal drip, sinus pressure and chills. Onset of symptoms was 1 week ago. Symptoms have been gradually worsening since that time. Past history is significant for no history of pneumonia or bronchitis. Patient is a non-smoker.  The following portions of the patient's history were reviewed and updated as appropriate: allergies, current medications, past family history, past medical history, past social history, past surgical history and problem list.  Review of Systems Pertinent items are noted in HPI.   Objective:    BP 120/78 mmHg  Pulse 72  Temp(Src) 97.8 F (36.6 C) (Oral)  Wt 148 lb 3.2 oz (67.223 kg)  SpO2 96% General appearance: alert, cooperative, appears stated age and no distress Ears: normal TM's and external ear canals both ears Nose: green discharge, moderate congestion, turbinates red, swollen, sinus tenderness bilateral Throat: abnormal findings: mild oropharyngeal erythema Neck: mild anterior cervical adenopathy, supple, symmetrical, trachea midline and thyroid not enlarged, symmetric, no tenderness/mass/nodules Lungs: clear to auscultation bilaterally Heart: S1, S2 normal    Assessment:    Acute bacterial sinusitis.    Plan:    Nasal saline sprays. Nasal steroids per medication orders. Antihistamines per medication orders. Augmentin per medication orders.   rto prn

## 2014-05-08 DIAGNOSIS — M1811 Unilateral primary osteoarthritis of first carpometacarpal joint, right hand: Secondary | ICD-10-CM | POA: Diagnosis not present

## 2014-05-08 DIAGNOSIS — M79641 Pain in right hand: Secondary | ICD-10-CM | POA: Diagnosis not present

## 2014-06-02 ENCOUNTER — Other Ambulatory Visit: Payer: Self-pay | Admitting: Cardiology

## 2014-06-04 NOTE — Telephone Encounter (Signed)
Rx(s) sent to pharmacy electronically.  

## 2014-06-18 ENCOUNTER — Other Ambulatory Visit: Payer: Self-pay | Admitting: Cardiology

## 2014-06-18 NOTE — Telephone Encounter (Signed)
Rx refill sent to patient pharmacy   

## 2014-06-29 DIAGNOSIS — J0101 Acute recurrent maxillary sinusitis: Secondary | ICD-10-CM | POA: Diagnosis not present

## 2014-06-29 DIAGNOSIS — M5030 Other cervical disc degeneration, unspecified cervical region: Secondary | ICD-10-CM | POA: Insufficient documentation

## 2014-06-29 DIAGNOSIS — J042 Acute laryngotracheitis: Secondary | ICD-10-CM | POA: Diagnosis not present

## 2014-06-29 DIAGNOSIS — M159 Polyosteoarthritis, unspecified: Secondary | ICD-10-CM | POA: Insufficient documentation

## 2014-06-29 DIAGNOSIS — I2510 Atherosclerotic heart disease of native coronary artery without angina pectoris: Secondary | ICD-10-CM | POA: Insufficient documentation

## 2014-06-29 DIAGNOSIS — Z8673 Personal history of transient ischemic attack (TIA), and cerebral infarction without residual deficits: Secondary | ICD-10-CM | POA: Insufficient documentation

## 2014-06-29 DIAGNOSIS — J060 Acute laryngopharyngitis: Secondary | ICD-10-CM | POA: Diagnosis not present

## 2014-07-02 ENCOUNTER — Other Ambulatory Visit: Payer: Self-pay | Admitting: Family Medicine

## 2014-07-02 NOTE — Telephone Encounter (Signed)
Last seen and filled 04/30/14 #120 with 1 refill.  Please advise if refill appropriate.    KP

## 2014-07-16 ENCOUNTER — Encounter: Payer: Self-pay | Admitting: Family Medicine

## 2014-07-16 ENCOUNTER — Ambulatory Visit (INDEPENDENT_AMBULATORY_CARE_PROVIDER_SITE_OTHER): Payer: Medicare Other | Admitting: Family Medicine

## 2014-07-16 VITALS — BP 136/80 | HR 74 | Temp 98.3°F | Wt 147.4 lb

## 2014-07-16 DIAGNOSIS — R1010 Upper abdominal pain, unspecified: Secondary | ICD-10-CM

## 2014-07-16 DIAGNOSIS — R102 Pelvic and perineal pain: Secondary | ICD-10-CM | POA: Diagnosis not present

## 2014-07-16 DIAGNOSIS — R1011 Right upper quadrant pain: Secondary | ICD-10-CM

## 2014-07-16 LAB — POCT URINALYSIS DIPSTICK
BILIRUBIN UA: NEGATIVE
Blood, UA: NEGATIVE
GLUCOSE UA: NEGATIVE
Ketones, UA: NEGATIVE
LEUKOCYTES UA: NEGATIVE
Nitrite, UA: NEGATIVE
PH UA: 5.5
Protein, UA: NEGATIVE
Spec Grav, UA: >=1.03
Urobilinogen, UA: 2

## 2014-07-16 MED ORDER — OMEPRAZOLE 40 MG PO CPDR: 40.0000 mg | DELAYED_RELEASE_CAPSULE | Freq: Every day | ORAL | Status: DC

## 2014-07-16 MED ORDER — GI COCKTAIL ~~LOC~~
30.0000 mL | Freq: Once | ORAL | Status: AC
  Administered 2014-07-16: 30 mL via ORAL

## 2014-07-16 NOTE — Progress Notes (Signed)
Pre visit review using our clinic review tool, if applicable. No additional management support is needed unless otherwise documented below in the visit note. 

## 2014-07-16 NOTE — Assessment & Plan Note (Signed)
Check [place] abd Omeprazole 20 mg qd GI cocktail given in office with relief

## 2014-07-16 NOTE — Progress Notes (Signed)
Patient ID: KESS MCILWAIN, female    DOB: 1945-04-18  Age: 69 y.o. MRN: [id number]    Subjective:  Subjective HPI ALTHEA BACKS presents for abd pain and R sided back pain x several weeks.  No otc meds.    Review of Systems  Constitutional: Negative for diaphoresis, appetite change, fatigue and unexpected weight change.  Eyes: Negative for pain, redness and visual disturbance.  Respiratory: Negative for cough, chest tightness, shortness of breath and wheezing.   Cardiovascular: Negative for chest pain, palpitations and leg swelling.  Endocrine: Negative for cold intolerance, heat intolerance, polydipsia, polyphagia and polyuria.  Genitourinary: Negative for dysuria, frequency and difficulty urinating.  Neurological: Negative for dizziness, light-headedness, numbness and headaches.  Psychiatric/Behavioral: Negative for decreased concentration. The patient is not nervous/anxious.     History Past Medical History  Diagnosis Date  . Arthritis   . Diverticulitis   . Migraines   . DVT (deep venous thrombosis)     lower extremity  . Colon polyp   . Thyroid disease   . TIA (transient ischemic attack)     8 years ago  . Heart attack     10 years ago  . Heart murmur   . H/O cardiac catheterization 2004    Normal coronary arteries  . Hx of echocardiogram 01/27/2010    Normal Ef 55% the transmitral spectral doppler flow pattern is normal for age. the left ventricular wall motion is normal  . History of stress test 05/21/2011    She has past surgical history that includes Vaginal hysterectomy (10/17/1998); Breast biopsy; Total knee arthroplasty; Neck surgery; Cardiac catheterization; and left heart catheterization with coronary angiogram (N/A, 10/25/2013).   Her family history includes Arthritis in an other family member; Colon cancer in her brother and father; Diabetes in her maternal grandmother; HIV in her brother; Heart disease in her maternal grandmother; Hypertension in  her maternal grandmother; Kidney cancer in her brother; Lung cancer in her brother; Other in her brother; Ovarian cancer in her mother; Prostate cancer in her father; Stroke in her maternal grandmother; Uterine cancer in her mother.She reports that she quit smoking about 73 years ago. She has never used smokeless tobacco. She reports that she does not drink alcohol or use illicit drugs.  Current Outpatient Prescriptions on File Prior to Visit  Medication Sig Dispense Refill  . Ascorbic Acid (VITAMIN C PO) Take 2 tablets by mouth daily.     Marland Kitchen BREWERS YEAST PO Take 2 tablets by mouth 2 (two) times daily.     . clopidogrel (PLAVIX) 75 MG tablet Take 1 tablet (75 mg total) by mouth daily. <please make appointment> 30 tablet 11  . dicyclomine (BENTYL) 20 MG tablet TAKE 1 TABLET BY MOUTH 4 TIMES A DAY AS NEEDED 120 tablet 1  . estradiol (ESTRACE) 2 MG tablet Take 2 mg by mouth daily.  4  . furosemide (LASIX) 40 MG tablet Take 1 tablet (40 mg total) by mouth 2 (two) times daily. According to how patient feels 60 tablet 5  . potassium chloride SA (KLOR-CON M20) 20 MEQ tablet Take 1 tablet (20 mEq total) by mouth 3 (three) times daily. 90 tablet 6  . SYNTHROID 125 MCG tablet TAKE 1 TABLET (125 MCG TOTAL) BY MOUTH DAILY. 30 tablet 11  . vitamin D, CHOLECALCIFEROL, 400 UNITS tablet Take 400 Units by mouth 2 (two) times daily.      No current facility-administered medications on file prior to visit.     Objective:  Objective Physical Exam  Constitutional: She is oriented to person, place, and time. She appears well-developed and well-nourished. No distress.  HENT:  Right Ear: External ear normal.  Left Ear: External ear normal.  Nose: Nose normal.  Mouth/Throat: Oropharynx is clear and moist.  Eyes: EOM are normal. Pupils are equal, round, and reactive to light.  Neck: Normal range of motion. Neck supple.  Cardiovascular: Normal rate, regular rhythm and normal heart sounds.   No murmur  heard. Pulmonary/Chest: Effort normal and breath sounds normal. No respiratory distress. She has no wheezes. She has no rales. She exhibits no tenderness.  Abdominal: She exhibits no mass. There is tenderness. There is guarding. There is no rebound.    Neurological: She is alert and oriented to person, place, and time.  Psychiatric: She has a normal mood and affect. Her behavior is normal. Judgment and thought content normal.   BP 136/80 mmHg  Pulse 74  Temp(Src) 98.3 F (36.8 C) (Oral)  Wt 147 lb 6.4 oz (66.86 kg)  SpO2 97% Wt Readings from Last 3 Encounters:  07/16/14 147 lb 6.4 oz (66.86 kg)  04/30/14 148 lb 3.2 oz (67.223 kg)  03/06/14 148 lb 9.6 oz (67.405 kg)     Lab Results  Component Value Date   WBC 4.7 03/06/2014   HGB 13.5 03/06/2014   HCT 40.1 03/06/2014   PLT 202.0 03/06/2014   GLUCOSE 104* 03/06/2014   ALT 20 03/06/2014   AST 24 03/06/2014   NA 138 03/06/2014   K 3.4* 03/06/2014   CL 101 03/06/2014   CREATININE 0.9 03/06/2014   BUN 16 03/06/2014   CO2 31 03/06/2014   TSH 0.81 04/20/2013   INR 1.02 10/20/2013    Ct Abdomen Pelvis W Contrast  03/09/2014   CLINICAL DATA:  Severe generalized abdominal pain for 2 months.  EXAM: CT ABDOMEN AND PELVIS WITH CONTRAST  TECHNIQUE: Multidetector CT imaging of the abdomen and pelvis was performed using the standard protocol following bolus administration of intravenous contrast.  CONTRAST:  153mL OMNIPAQUE IOHEXOL 300 MG/ML  SOLN  COMPARISON:  CT scan of December 24, 2011.  FINDINGS: Severe degenerative disc disease is seen at T12-L1 with anterior osteophyte formation. Visualized lung bases appear normal.  No gallstones are noted. No focal abnormality is seen in the liver, spleen or pancreas. Adrenal glands and kidneys appear normal. No hydronephrosis or renal obstruction is noted. No renal or ureteral calculi are noted. The appendix appears normal. There is no evidence of bowel obstruction. Urinary bladder appears normal.  Stool is noted throughout the colon. No abnormal fluid collection is noted. Atherosclerotic calcifications of abdominal aorta are noted without aneurysm formation. Stable 11 mm aortocaval lymph node is noted compared to prior exam. Mild sigmoid diverticulosis is noted without inflammation.  IMPRESSION: Severe degenerative disease is noted at T12-L1.  Sigmoid diverticulosis is noted without inflammation.   Electronically Signed   By: Sabino Dick M.D.   On: 03/09/2014 11:25     Assessment & Plan:  Plan I have discontinued Ms. Ord's amoxicillin-clavulanate. I am also having her maintain her SYNTHROID, vitamin D (CHOLECALCIFEROL), Ascorbic Acid (VITAMIN C PO), BREWERS YEAST PO, clopidogrel, furosemide, estradiol, potassium chloride SA, dicyclomine, and omeprazole. We administered gi cocktail.  Meds ordered this encounter  Medications  . gi cocktail (Maalox,Lidocaine,Donnatal)    Sig:   . DISCONTD: omeprazole (PRILOSEC) 40 MG capsule    Sig: Take 1 capsule (40 mg total) by mouth daily.    Dispense:  30 capsule  Refill:  3  . omeprazole (PRILOSEC) 40 MG capsule    Sig: Take 1 capsule (40 mg total) by mouth daily.    Dispense:  30 capsule    Refill:  3    Problem List Items Addressed This Visit    Abdominal pain    Check [place] abd Omeprazole 20 mg qd GI cocktail given in office with relief       Other Visit Diagnoses    Pelvic pain in female    -  Primary    Relevant Medications    gi cocktail (Maalox,Lidocaine,Donnatal) (Completed)    Other Relevant Orders    POCT Urinalysis Dipstick (Completed)    Abdominal pain, right upper quadrant        Relevant Medications    omeprazole (PRILOSEC) capsule    Other Relevant Orders    US Abdomen Complete    Basic metabolic panel    CBC with Differential/Platelet    Hepatic function panel    H. pylori breath test       Follow-up: Return if symptoms worsen or fail to improve.  [name], DO

## 2014-07-16 NOTE — Patient Instructions (Signed)

## 2014-07-17 LAB — CBC WITH DIFFERENTIAL/PLATELET
BASOS ABS: 0 10*3/uL (ref 0.0–0.1)
Basophils Relative: 1 % (ref 0.0–3.0)
EOS ABS: 0.2 10*3/uL (ref 0.0–0.7)
Eosinophils Relative: 3.5 % (ref 0.0–5.0)
HEMATOCRIT: 40.1 % (ref 36.0–46.0)
Hemoglobin: 13.9 g/dL (ref 12.0–15.0)
Lymphocytes Relative: 42.6 % (ref 12.0–46.0)
Lymphs Abs: 2 10*3/uL (ref 0.7–4.0)
MCHC: 34.6 g/dL (ref 30.0–36.0)
MCV: 94.2 fl (ref 78.0–100.0)
MONO ABS: 0.7 10*3/uL (ref 0.1–1.0)
Monocytes Relative: 14.5 % — ABNORMAL HIGH (ref 3.0–12.0)
NEUTROS PCT: 38.4 % — AB (ref 43.0–77.0)
Neutro Abs: 1.8 10*3/uL (ref 1.4–7.7)
Platelets: 213 10*3/uL (ref 150.0–400.0)
RBC: 4.26 Mil/uL (ref 3.87–5.11)
RDW: 13.3 % (ref 11.5–15.5)
WBC: 4.6 10*3/uL (ref 4.0–10.5)

## 2014-07-17 LAB — HEPATIC FUNCTION PANEL
ALT: 17 U/L (ref 0–35)
AST: 21 U/L (ref 0–37)
Albumin: 3.9 g/dL (ref 3.5–5.2)
Alkaline Phosphatase: 65 U/L (ref 39–117)
Bilirubin, Direct: 0.1 mg/dL (ref 0.0–0.3)
TOTAL PROTEIN: 7.2 g/dL (ref 6.0–8.3)
Total Bilirubin: 0.4 mg/dL (ref 0.2–1.2)

## 2014-07-17 LAB — BASIC METABOLIC PANEL
BUN: 11 mg/dL (ref 6–23)
CALCIUM: 9.3 mg/dL (ref 8.4–10.5)
CO2: 32 mEq/L (ref 19–32)
CREATININE: 0.84 mg/dL (ref 0.40–1.20)
Chloride: 101 mEq/L (ref 96–112)
GFR: 71.33 mL/min (ref >60.00)
Glucose, Bld: 84 mg/dL (ref 70–99)
Potassium: 3.6 mEq/L (ref 3.5–5.1)
Sodium: 137 mEq/L (ref 135–145)

## 2014-07-17 LAB — H. PYLORI BREATH TEST: H. PYLORI BREATH TEST: NOT DETECTED

## 2014-07-18 ENCOUNTER — Ambulatory Visit (HOSPITAL_BASED_OUTPATIENT_CLINIC_OR_DEPARTMENT_OTHER)
Admission: RE | Admit: 2014-07-18 | Discharge: 2014-07-18 | Disposition: A | Discharge status: Home / Self Care | Payer: Medicare Other | Source: Ambulatory Visit | Attending: Family Medicine | Admitting: Family Medicine

## 2014-07-18 DIAGNOSIS — R1011 Right upper quadrant pain: Secondary | ICD-10-CM

## 2014-07-18 DIAGNOSIS — R109 Unspecified abdominal pain: Secondary | ICD-10-CM | POA: Diagnosis present

## 2014-07-18 DIAGNOSIS — K760 Fatty (change of) liver, not elsewhere classified: Secondary | ICD-10-CM | POA: Insufficient documentation

## 2014-08-01 ENCOUNTER — Telehealth: Payer: Self-pay | Admitting: Family Medicine

## 2014-08-01 NOTE — Telephone Encounter (Signed)
°  Relation to pt: self  Call back number: [phone] Pharmacy: [facility] [phone] fax # [phone]   Reason for call:  Pt states that RX stated 2x daily for potassium chloride SA (KLOR-CON M20) 20 MEQ tablet  And it needs to be faxed to new pharmacy [facility] [phone] fax # [phone]

## 2014-08-01 NOTE — Telephone Encounter (Signed)
Detailed message left advising the Rx was sent with refills to [facility] in [place] and she can call the pharmacy to have it transferred to her preferred pharmacy.      KP

## 2014-08-14 ENCOUNTER — Encounter: Payer: Self-pay | Admitting: Family Medicine

## 2014-08-14 ENCOUNTER — Ambulatory Visit (INDEPENDENT_AMBULATORY_CARE_PROVIDER_SITE_OTHER): Payer: Medicare Other | Admitting: Family Medicine

## 2014-08-14 VITALS — BP 129/82 | HR 71 | Temp 98.1°F | Wt 151.0 lb

## 2014-08-14 DIAGNOSIS — M79662 Pain in left lower leg: Secondary | ICD-10-CM

## 2014-08-14 DIAGNOSIS — R1011 Right upper quadrant pain: Secondary | ICD-10-CM | POA: Diagnosis not present

## 2014-08-14 DIAGNOSIS — I83892 Varicose veins of left lower extremities with other complications: Secondary | ICD-10-CM

## 2014-08-14 MED ORDER — OMEPRAZOLE 40 MG PO CPDR: 40.0000 mg | DELAYED_RELEASE_CAPSULE | Freq: Every day | ORAL | Status: DC

## 2014-08-14 NOTE — Progress Notes (Signed)
Pre visit review using our clinic review tool, if applicable. No additional management support is needed unless otherwise documented below in the visit note. 

## 2014-08-14 NOTE — Patient Instructions (Signed)
Varicose Veins Varicose veins are veins that have become enlarged and twisted. CAUSES This condition is the result of valves in the veins not working properly. Valves in the veins help return blood from the leg to the heart. If these valves are damaged, blood flows backwards and backs up into the veins in the leg near the skin. This causes the veins to become larger. People who are on their feet a lot, who are pregnant, or who are overweight are more likely to develop varicose veins. SYMPTOMS   Bulging, twisted-appearing, bluish veins, most commonly found on the legs.  Leg pain or a feeling of heaviness. These symptoms may be worse at the end of the day.  Leg swelling.  Skin color changes. DIAGNOSIS  Varicose veins can usually be diagnosed with an exam of your legs by your caregiver. He or she may recommend an ultrasound of your leg veins. TREATMENT  Most varicose veins can be treated at home.However, other treatments are available for people who have persistent symptoms or who want to treat the cosmetic appearance of the varicose veins. These include:  Laser treatment of very small varicose veins.  Medicine that is shot (injected) into the vein. This medicine hardens the walls of the vein and closes off the vein. This treatment is called sclerotherapy. Afterwards, you may need to wear clothing or bandages that apply pressure.  Surgery. HOME CARE INSTRUCTIONS   Do not stand or sit in one position for long periods of time. Do not sit with your legs crossed. Rest with your legs raised during the day.  Wear elastic stockings or support hose. Do not wear other tight, encircling garments around the legs, pelvis, or waist.  Walk as much as possible to increase blood flow.  Raise the foot of your bed at night with 2-inch blocks.  If you get a cut in the skin over the vein and the vein bleeds, lie down with your leg raised and press on it with a clean cloth until the bleeding stops. Then  place a bandage (dressing) on the cut. See your caregiver if it continues to bleed or needs stitches. SEEK MEDICAL CARE IF:   The skin around your ankle starts to break down.  You have pain, redness, tenderness, or hard swelling developing in your leg over a vein.  You are uncomfortable due to leg pain. Document Released: 12/31/2004 Document Revised: 06/15/2011 Document Reviewed: 05/19/2010 [facility] Patient Information 2015 [place], [place]. This information is not intended to replace advice given to you by your health care provider. Make sure you discuss any questions you have with your health care provider.

## 2014-08-15 ENCOUNTER — Ambulatory Visit (HOSPITAL_BASED_OUTPATIENT_CLINIC_OR_DEPARTMENT_OTHER)
Admission: RE | Admit: 2014-08-15 | Discharge: 2014-08-15 | Disposition: A | Discharge status: Home / Self Care | Payer: Medicare Other | Source: Ambulatory Visit | Attending: Family Medicine | Admitting: Family Medicine

## 2014-08-15 DIAGNOSIS — M25472 Effusion, left ankle: Secondary | ICD-10-CM | POA: Diagnosis not present

## 2014-08-15 DIAGNOSIS — M7122 Synovial cyst of popliteal space [Baker], left knee: Secondary | ICD-10-CM | POA: Insufficient documentation

## 2014-08-15 DIAGNOSIS — M25572 Pain in left ankle and joints of left foot: Secondary | ICD-10-CM | POA: Diagnosis not present

## 2014-08-15 DIAGNOSIS — I83892 Varicose veins of left lower extremities with other complications: Secondary | ICD-10-CM

## 2014-08-15 DIAGNOSIS — M7989 Other specified soft tissue disorders: Secondary | ICD-10-CM | POA: Diagnosis not present

## 2014-08-15 DIAGNOSIS — Z96653 Presence of artificial knee joint, bilateral: Secondary | ICD-10-CM | POA: Diagnosis not present

## 2014-08-15 DIAGNOSIS — M79662 Pain in left lower leg: Secondary | ICD-10-CM

## 2014-08-15 NOTE — Progress Notes (Signed)
Patient ID: Alexandria [name], female    DOB: [birth year]  Age: 69 y.o. MRN: [id number]    Subjective:  Subjective HPI CHANIAH CISSE presents with complaint pain in L ankle, calf and behind knee.  Pain is better with compression stocking.  It has gotten worse since seeing vascular surgeon a few years ago.    No cp, no sob  Review of Systems  Constitutional: Negative for activity change, appetite change, fatigue and unexpected weight change.  Respiratory: Negative for cough and shortness of breath.   Cardiovascular: Negative for chest pain and palpitations.  Musculoskeletal: Positive for joint swelling and gait problem.  Skin:       Bruising Leg---ankle-- pain in calf and behind knee + spider veins, varicose   Psychiatric/Behavioral: Negative for behavioral problems and dysphoric mood. The patient is not nervous/anxious.     History Past Medical History  Diagnosis Date  . Arthritis   . Diverticulitis   . Migraines   . DVT (deep venous thrombosis)     lower extremity  . Colon polyp   . Thyroid disease   . TIA (transient ischemic attack)     8 years ago  . Heart attack     10 years ago  . Heart murmur   . H/O cardiac catheterization 2004    Normal coronary arteries  . Hx of echocardiogram 01/27/2010    Normal Ef 55% the transmitral spectral doppler flow pattern is normal for age. the left ventricular wall motion is normal  . History of stress test 05/21/2011    She has past surgical history that includes Vaginal hysterectomy (10/17/1998); Breast biopsy; Total knee arthroplasty; Neck surgery; Cardiac catheterization; and left heart catheterization with coronary angiogram (N/A, 10/25/2013).   Her family history includes Arthritis in an other family member; Colon cancer in her brother and father; Diabetes in her maternal grandmother; HIV in her brother; Heart disease in her maternal grandmother; Hypertension in her maternal grandmother; Kidney cancer in her brother; Lung  cancer in her brother; Other in her brother; Ovarian cancer in her mother; Prostate cancer in her father; Stroke in her maternal grandmother; Uterine cancer in her mother.She reports that she quit smoking about 73 years ago. She has never used smokeless tobacco. She reports that she does not drink alcohol or use illicit drugs.  Current Outpatient Prescriptions on File Prior to Visit  Medication Sig Dispense Refill  . Ascorbic Acid (VITAMIN C PO) Take 2 tablets by mouth daily.     Marland Kitchen BREWERS YEAST PO Take 2 tablets by mouth 2 (two) times daily.     . clopidogrel (PLAVIX) 75 MG tablet Take 1 tablet (75 mg total) by mouth daily. <please make appointment> 30 tablet 11  . dicyclomine (BENTYL) 20 MG tablet TAKE 1 TABLET BY MOUTH 4 TIMES A DAY AS NEEDED 120 tablet 1  . estradiol (ESTRACE) 2 MG tablet Take 2 mg by mouth daily.  4  . furosemide (LASIX) 40 MG tablet Take 1 tablet (40 mg total) by mouth 2 (two) times daily. According to how patient feels 60 tablet 5  . potassium chloride SA (KLOR-CON M20) 20 MEQ tablet Take 1 tablet (20 mEq total) by mouth 3 (three) times daily. 90 tablet 6  . SYNTHROID 125 MCG tablet TAKE 1 TABLET (125 MCG TOTAL) BY MOUTH DAILY. 30 tablet 11  . vitamin D, CHOLECALCIFEROL, 400 UNITS tablet Take 400 Units by mouth 2 (two) times daily.      No current facility-administered medications on  file prior to visit.     Objective:  Objective Physical Exam  Constitutional: She is oriented to person, place, and time. She appears well-developed and well-nourished.  HENT:  Head: Normocephalic and atraumatic.  Eyes: Conjunctivae and EOM are normal.  Neck: Normal range of motion. Neck supple. No JVD present. Carotid bruit is not present. No thyromegaly present.  Cardiovascular: Normal rate, regular rhythm and normal heart sounds.   No murmur heard. Pulmonary/Chest: Effort normal and breath sounds normal. No respiratory distress. She has no wheezes. She has no rales. She exhibits no  tenderness.  Musculoskeletal: She exhibits no edema.  Neurological: She is alert and oriented to person, place, and time.  Skin:     Psychiatric: She has a normal mood and affect.   BP 129/82 mmHg  Pulse 71  Temp(Src) 98.1 F (36.7 C) (Oral)  Wt 151 lb (68.493 kg)  SpO2 96% Wt Readings from Last 3 Encounters:  08/14/14 151 lb (68.493 kg)  07/16/14 147 lb 6.4 oz (66.86 kg)  04/30/14 148 lb 3.2 oz (67.223 kg)     Lab Results  Component Value Date   WBC 4.6 07/16/2014   HGB 13.9 07/16/2014   HCT 40.1 07/16/2014   PLT 213.0 07/16/2014   GLUCOSE 84 07/16/2014   ALT 17 07/16/2014   AST 21 07/16/2014   NA 137 07/16/2014   K 3.6 07/16/2014   CL 101 07/16/2014   CREATININE 0.84 07/16/2014   BUN 11 07/16/2014   CO2 32 07/16/2014   TSH 0.81 04/20/2013   INR 1.02 10/20/2013    US Venous Img Lower Unilateral Left  08/15/2014   CLINICAL DATA:  Left ankle pain, swelling and discoloration for the past for 5 days. History of prior DVT and varicose veins. History of bilateral knee replacements. Evaluate for acute or chronic DVT.  EXAM: LEFT LOWER EXTREMITY VENOUS DOPPLER ULTRASOUND  TECHNIQUE: Gray-scale sonography with graded compression, as well as color Doppler and duplex ultrasound were performed to evaluate the lower extremity deep venous systems from the level of the common femoral vein and including the common femoral, femoral, profunda femoral, popliteal and calf veins including the posterior tibial, peroneal and gastrocnemius veins when visible. The superficial great saphenous vein was also interrogated. Spectral Doppler was utilized to evaluate flow at rest and with distal augmentation maneuvers in the common femoral, femoral and popliteal veins.  COMPARISON:  None.  FINDINGS: Contralateral Common Femoral Vein: Respiratory phasicity is normal and symmetric with the symptomatic side. No evidence of thrombus. Normal compressibility.  Common Femoral Vein: No evidence of thrombus. Normal  compressibility, respiratory phasicity and response to augmentation.  Saphenofemoral Junction: No evidence of thrombus. Normal compressibility and flow on color Doppler imaging.  Profunda Femoral Vein: No evidence of thrombus. Normal compressibility and flow on color Doppler imaging.  Femoral Vein: No evidence of thrombus. Normal compressibility, respiratory phasicity and response to augmentation.  Popliteal Vein: No evidence of thrombus. Normal compressibility, respiratory phasicity and response to augmentation.  Calf Veins: No evidence of thrombus. Normal compressibility and flow on color Doppler imaging.  Superficial Great Saphenous Vein: No evidence of thrombus. Normal compressibility and flow on color Doppler imaging.  Venous Reflux:  None.  Other Findings: Note is made of a serpiginous mixed echogenic approximately 3.1 x 5.4 x 1.1 cm fluid collection within the left popliteal fossa favored to represent a minimally complex left-sided Baker cyst.  IMPRESSION: 1. No evidence of acute or chronic DVT within the left lower extremity. 2. Incidental note  made of a approximately 5.4 cm minimally complex left-sided Baker cyst.   Electronically Signed   By: Sandi Mariscal M.D.   On: 08/15/2014 16:45     Assessment & Plan:  Plan I am having Ms. Hopf maintain her SYNTHROID, vitamin D (CHOLECALCIFEROL), Ascorbic Acid (VITAMIN C PO), BREWERS YEAST PO, clopidogrel, furosemide, estradiol, potassium chloride SA, dicyclomine, and omeprazole.  Meds ordered this encounter  Medications  . omeprazole (PRILOSEC) 40 MG capsule    Sig: Take 1 capsule (40 mg total) by mouth daily.    Dispense:  30 capsule    Refill:  3    Problem List Items Addressed This Visit    None    Visit Diagnoses    Varicose veins of left leg with edema    -  Primary    Relevant Orders    Ambulatory referral to Vascular Surgery    US Venous Img Lower Unilateral Left (Completed)    Abdominal pain, right upper quadrant        Relevant  Medications    omeprazole (PRILOSEC) 40 MG capsule    Calf pain, left        Relevant Orders    US Venous Img Lower Unilateral Left (Completed)       Follow-up: Return if symptoms worsen or fail to improve.  [name], DO

## 2014-08-16 ENCOUNTER — Telehealth: Payer: Self-pay | Admitting: Family Medicine

## 2014-08-16 DIAGNOSIS — M7122 Synovial cyst of popliteal space [Baker], left knee: Secondary | ICD-10-CM

## 2014-08-16 NOTE — Telephone Encounter (Signed)
Relation to pt: self  Call back number:  [phone]   Reason for call:  Pt requesting lab results. Pt last seen 08/14/14

## 2014-08-16 NOTE — Telephone Encounter (Signed)
No clot--- + baker cyst on left but not in area of pain   Vascular referral pending      Patient has been made aware and verbalized understanding.      KP

## 2014-08-16 NOTE — Telephone Encounter (Signed)
She wants to do something about the baker's cyst. Please advise     KP

## 2014-08-16 NOTE — Telephone Encounter (Signed)
Refer to ortho.

## 2014-08-17 DIAGNOSIS — M7122 Synovial cyst of popliteal space [Baker], left knee: Secondary | ICD-10-CM | POA: Diagnosis not present

## 2014-08-17 NOTE — Telephone Encounter (Signed)
Ref placed.      KP 

## 2014-08-20 ENCOUNTER — Other Ambulatory Visit: Payer: Self-pay | Admitting: *Deleted

## 2014-08-20 DIAGNOSIS — I83892 Varicose veins of left lower extremities with other complications: Secondary | ICD-10-CM

## 2014-08-22 ENCOUNTER — Telehealth: Payer: Self-pay | Admitting: Family Medicine

## 2014-08-22 NOTE — Telephone Encounter (Signed)
Should say blood clot

## 2014-08-22 NOTE — Telephone Encounter (Signed)
To MD for review     KP 

## 2014-08-22 NOTE — Telephone Encounter (Signed)
Caller name:Alexandria [name] Relationship to patient:SELF Can be [phone] Pharmacy:  Reason for call: LAST WEEK THEY THOUGHT SHE HAD A BLOT CLOT  IT WAS A BAKERS CYST  WENT TO DR Lyla Glassing AT [facility]  HE WAS CONCERNED THAT HER ANKLES ARE BRUISED AND SHE DOES NOT KNOW WHY.  THE DOCTOR SUGGESTED THAT SHE TELL DR LOWNE THAT THIS NEEDS TO BE TAKEN CARE OF    SHE REALLY LIKED HIM AND WOULD RECOMMEND HIM TO ANYONE

## 2014-08-23 NOTE — Telephone Encounter (Signed)
To MD to review.     KP 

## 2014-08-23 NOTE — Telephone Encounter (Signed)
detailed message left with the below recommendation and to call if any questions or concerns.      KP

## 2014-08-23 NOTE — Telephone Encounter (Signed)
We are--- we thought it may be related to varicose veins--- a referral for vascular was put in when she was here last---if it is not related to that we will refer to hematology

## 2014-08-29 DIAGNOSIS — Z01419 Encounter for gynecological examination (general) (routine) without abnormal findings: Secondary | ICD-10-CM | POA: Diagnosis not present

## 2014-08-29 DIAGNOSIS — Z1231 Encounter for screening mammogram for malignant neoplasm of breast: Secondary | ICD-10-CM | POA: Diagnosis not present

## 2014-08-29 DIAGNOSIS — Z6825 Body mass index (BMI) 25.0-25.9, adult: Secondary | ICD-10-CM | POA: Diagnosis not present

## 2014-08-29 DIAGNOSIS — Z1272 Encounter for screening for malignant neoplasm of vagina: Secondary | ICD-10-CM | POA: Diagnosis not present

## 2014-08-29 DIAGNOSIS — Z124 Encounter for screening for malignant neoplasm of cervix: Secondary | ICD-10-CM | POA: Diagnosis not present

## 2014-09-02 ENCOUNTER — Other Ambulatory Visit: Payer: Self-pay | Admitting: Family Medicine

## 2014-09-05 ENCOUNTER — Encounter: Payer: Self-pay | Admitting: Behavioral Health

## 2014-09-05 DIAGNOSIS — R51 Headache: Secondary | ICD-10-CM | POA: Diagnosis not present

## 2014-09-05 DIAGNOSIS — H04123 Dry eye syndrome of bilateral lacrimal glands: Secondary | ICD-10-CM | POA: Diagnosis not present

## 2014-09-07 ENCOUNTER — Encounter: Payer: Self-pay | Admitting: Family Medicine

## 2014-09-07 ENCOUNTER — Ambulatory Visit (INDEPENDENT_AMBULATORY_CARE_PROVIDER_SITE_OTHER): Payer: Medicare Other | Admitting: Family Medicine

## 2014-09-07 ENCOUNTER — Ambulatory Visit: Payer: Medicare Other | Admitting: Medical

## 2014-09-07 VITALS — BP 112/72 | HR 68 | Temp 99.1°F | Wt 147.0 lb

## 2014-09-07 DIAGNOSIS — R51 Headache: Secondary | ICD-10-CM | POA: Diagnosis not present

## 2014-09-07 DIAGNOSIS — R519 Headache, unspecified: Secondary | ICD-10-CM

## 2014-09-07 LAB — SEDIMENTATION RATE: SED RATE: 25 mm/h — AB (ref 0–22)

## 2014-09-07 NOTE — Patient Instructions (Signed)

## 2014-09-07 NOTE — Progress Notes (Signed)
Pre visit review using our clinic review tool, if applicable. No additional management support is needed unless otherwise documented below in the visit note. 

## 2014-09-07 NOTE — Progress Notes (Signed)
Subjective:    Patient ID: JOELEE [name], female    DOB: 12/23/1945, 69 y.o.   MRN: [id number]  HPI  Patient here for f/u headaches.  Pt saw [facility] yesterday and they wasnted esr done secondary to pt having headaches.    Past Medical History  Diagnosis Date  . Arthritis   . Diverticulitis   . Migraines   . DVT (deep venous thrombosis)     lower extremity  . Colon polyp   . Thyroid disease   . TIA (transient ischemic attack)     8 years ago  . Heart attack     10 years ago  . Heart murmur   . H/O cardiac catheterization 2004    Normal coronary arteries  . Hx of echocardiogram 01/27/2010    Normal Ef 55% the transmitral spectral doppler flow pattern is normal for age. the left ventricular wall motion is normal  . History of stress test 05/21/2011    Review of Systems  Constitutional: Negative for activity change, appetite change, fatigue and unexpected weight change.  Respiratory: Negative for cough and shortness of breath.   Cardiovascular: Negative for chest pain and palpitations.  Psychiatric/Behavioral: Negative for behavioral problems and dysphoric mood. The patient is not nervous/anxious.     Current Outpatient Prescriptions on File Prior to Visit  Medication Sig Dispense Refill  . Ascorbic Acid (VITAMIN C PO) Take 2 tablets by mouth daily.     Marland Kitchen BREWERS YEAST PO Take 2 tablets by mouth 2 (two) times daily.     . clopidogrel (PLAVIX) 75 MG tablet Take 1 tablet (75 mg total) by mouth daily. <please make appointment> 30 tablet 11  . dicyclomine (BENTYL) 20 MG tablet TAKE 1 TABLET BY MOUTH 4 TIMES A DAY AS NEEDED 120 tablet 1  . furosemide (LASIX) 40 MG tablet Take 1 tablet (40 mg total) by mouth 2 (two) times daily. According to how patient feels 60 tablet 5  . omeprazole (PRILOSEC) 40 MG capsule Take 1 capsule (40 mg total) by mouth daily. 30 capsule 3  . potassium chloride SA (KLOR-CON M20) 20 MEQ tablet Take 1 tablet (20 mEq total) by mouth 3 (three) times  daily. 90 tablet 6  . SYNTHROID 125 MCG tablet TAKE 1 TABLET (125 MCG TOTAL) BY MOUTH DAILY. 30 tablet 11  . vitamin D, CHOLECALCIFEROL, 400 UNITS tablet Take 400 Units by mouth 2 (two) times daily.      No current facility-administered medications on file prior to visit.       Objective:    Physical Exam  Constitutional: She is oriented to person, place, and time. She appears well-developed and well-nourished.  HENT:  Head: Normocephalic and atraumatic.  Eyes: Conjunctivae and EOM are normal.  Neck: Normal range of motion. Neck supple. No JVD present. Carotid bruit is not present. No thyromegaly present.  Cardiovascular: Normal rate, regular rhythm and normal heart sounds.   No murmur heard. Pulmonary/Chest: Effort normal and breath sounds normal. No respiratory distress. She has no wheezes. She has no rales. She exhibits no tenderness.  Musculoskeletal: She exhibits no edema.  Neurological: She is alert and oriented to person, place, and time.  Psychiatric: She has a normal mood and affect. Her behavior is normal.    BP 112/72 mmHg  Pulse 68  Temp(Src) 99.1 F (37.3 C) (Oral)  Wt 147 lb (66.679 kg)  SpO2 98% Wt Readings from Last 3 Encounters:  09/07/14 147 lb (66.679 kg)  08/14/14 151 lb (68.493  kg)  07/16/14 147 lb 6.4 oz (66.86 kg)     Lab Results  Component Value Date   WBC 4.6 07/16/2014   HGB 13.9 07/16/2014   HCT 40.1 07/16/2014   PLT 213.0 07/16/2014   GLUCOSE 84 07/16/2014   ALT 17 07/16/2014   AST 21 07/16/2014   NA 137 07/16/2014   K 3.6 07/16/2014   CL 101 07/16/2014   CREATININE 0.84 07/16/2014   BUN 11 07/16/2014   CO2 32 07/16/2014   TSH 0.81 04/20/2013   INR 1.02 10/20/2013       Assessment & Plan:   Problem List Items Addressed This Visit    None    Visit Diagnoses    Nonintractable episodic headache, unspecified headache type    -  Primary    Relevant Orders    Sed Rate (ESR)       I am having Alexandria [name] maintain her SYNTHROID,  vitamin D (CHOLECALCIFEROL), Ascorbic Acid (VITAMIN C PO), BREWERS YEAST PO, clopidogrel, furosemide, potassium chloride SA, omeprazole, dicyclomine, and estradiol.  Meds ordered this encounter  Medications  . estradiol (ESTRACE) 1 MG tablet    Sig: Take 1 mg by mouth daily.     [name], DO

## 2014-09-14 DIAGNOSIS — H1131 Conjunctival hemorrhage, right eye: Secondary | ICD-10-CM | POA: Diagnosis not present

## 2014-09-21 ENCOUNTER — Encounter: Payer: Self-pay | Admitting: Vascular Surgery

## 2014-09-26 ENCOUNTER — Encounter: Payer: Medicare Other | Admitting: Vascular Surgery

## 2014-09-26 ENCOUNTER — Encounter (HOSPITAL_COMMUNITY): Payer: Medicare Other

## 2014-10-02 ENCOUNTER — Other Ambulatory Visit: Payer: Self-pay | Admitting: Family Medicine

## 2014-10-02 ENCOUNTER — Telehealth: Payer: Self-pay | Admitting: Family Medicine

## 2014-10-02 DIAGNOSIS — E039 Hypothyroidism, unspecified: Secondary | ICD-10-CM

## 2014-10-02 DIAGNOSIS — H2513 Age-related nuclear cataract, bilateral: Secondary | ICD-10-CM | POA: Diagnosis not present

## 2014-10-02 DIAGNOSIS — H04123 Dry eye syndrome of bilateral lacrimal glands: Secondary | ICD-10-CM | POA: Diagnosis not present

## 2014-10-02 DIAGNOSIS — H524 Presbyopia: Secondary | ICD-10-CM | POA: Diagnosis not present

## 2014-10-02 NOTE — Telephone Encounter (Signed)
It has been sent but she needs a TSH done. Please schedule a lab apt for a thyroid test. Last done in 04/20/2013.    KP

## 2014-10-02 NOTE — Telephone Encounter (Signed)
Relation to pt: self Call back number: [phone] Pharmacy: [id number] [place], [place] [phone] (Phone) [phone] (Fax)        Reason for call:  Pt requesting SYNTHROID 125 MCG tablet

## 2014-10-03 ENCOUNTER — Other Ambulatory Visit (INDEPENDENT_AMBULATORY_CARE_PROVIDER_SITE_OTHER): Payer: Medicare Other

## 2014-10-03 DIAGNOSIS — E039 Hypothyroidism, unspecified: Secondary | ICD-10-CM

## 2014-10-03 NOTE — Telephone Encounter (Signed)
Pt scheduled lab appointment for 10/03/2014

## 2014-10-04 ENCOUNTER — Telehealth: Payer: Self-pay | Admitting: Family Medicine

## 2014-10-04 ENCOUNTER — Other Ambulatory Visit: Payer: Self-pay

## 2014-10-04 DIAGNOSIS — S61207A Unspecified open wound of left little finger without damage to nail, initial encounter: Secondary | ICD-10-CM | POA: Diagnosis not present

## 2014-10-04 LAB — TSH: TSH: 0.18 u[IU]/mL — ABNORMAL LOW (ref 0.35–4.50)

## 2014-10-04 MED ORDER — SYNTHROID 112 MCG PO TABS: 112.0000 ug | ORAL_TABLET | Freq: Every day | ORAL | Status: DC

## 2014-10-04 NOTE — Telephone Encounter (Signed)
Caller name: Ruthy Forry Relationship to patient: self Can be reached: [phone] [facility] [address]  Reason for call: Pt waiting to pick up synthroid until she hears lab results in case there is a dose change. Please notify her asap with the results and let her know if the med is staying the same or change. If change cancel the [facility] at [facility].

## 2014-10-04 NOTE — Telephone Encounter (Signed)
Patient aware that the blood has not been picked up and as soon as we get the results back I would give her a call and she verbalized understanding.     KP

## 2014-10-22 ENCOUNTER — Encounter: Payer: Self-pay | Admitting: Vascular Surgery

## 2014-10-24 ENCOUNTER — Ambulatory Visit (HOSPITAL_COMMUNITY)
Admission: RE | Admit: 2014-10-24 | Discharge: 2014-10-24 | Disposition: A | Discharge status: Home / Self Care | Payer: Medicare Other | Source: Ambulatory Visit | Attending: Vascular Surgery | Admitting: Vascular Surgery

## 2014-10-24 ENCOUNTER — Encounter: Payer: Self-pay | Admitting: Vascular Surgery

## 2014-10-24 ENCOUNTER — Ambulatory Visit (INDEPENDENT_AMBULATORY_CARE_PROVIDER_SITE_OTHER): Payer: Medicare Other | Admitting: Vascular Surgery

## 2014-10-24 VITALS — BP 139/86 | HR 72 | Temp 98.8°F | Resp 18 | Ht 64.0 in | Wt 145.0 lb

## 2014-10-24 DIAGNOSIS — I872 Venous insufficiency (chronic) (peripheral): Secondary | ICD-10-CM | POA: Diagnosis not present

## 2014-10-24 DIAGNOSIS — I83893 Varicose veins of bilateral lower extremities with other complications: Secondary | ICD-10-CM | POA: Diagnosis not present

## 2014-10-24 DIAGNOSIS — I87009 Postthrombotic syndrome without complications of unspecified extremity: Secondary | ICD-10-CM | POA: Diagnosis not present

## 2014-10-24 DIAGNOSIS — I83892 Varicose veins of left lower extremities with other complications: Secondary | ICD-10-CM | POA: Insufficient documentation

## 2014-10-24 DIAGNOSIS — I83899 Varicose veins of unspecified lower extremities with other complications: Secondary | ICD-10-CM | POA: Insufficient documentation

## 2014-10-24 NOTE — Progress Notes (Signed)
Referred by:  Rosalita Chessman, DO [address] [place], [place] [address]  Reason for referral: Swollen bilateral legs   History of Present Illness  Alexandria [name] is a 69 y.o. ([birth year]) female who presents with chief complaint: swollen leg.  Patient notes, onset of swelling years ago, associated with no obvious trigger.  The patient's symptoms include: increasing L>R leg swelling with progression of daily, bursting sensation near end of day.  The patient has had known history of DVT during B TKR, no history of pregnancy, known history of varicose vein, no history of venous stasis ulcers, no history of  Lymphedema and no history of skin changes in lower legs.  There is no family history of venous disorders.  The patient has used sub-therapeutic compression stockings in the past.  Past Medical History  Diagnosis Date  . Arthritis   . Diverticulitis   . Migraines   . DVT (deep venous thrombosis)     lower extremity  . Colon polyp   . Thyroid disease   . TIA (transient ischemic attack)     8 years ago  . Heart attack     10 years ago  . Heart murmur   . H/O cardiac catheterization 2004    Normal coronary arteries  . Hx of echocardiogram 01/27/2010    Normal Ef 55% the transmitral spectral doppler flow pattern is normal for age. the left ventricular wall motion is normal  . History of stress test 05/21/2011    Past Surgical History  Procedure Laterality Date  . Vaginal hysterectomy  10/17/1998    Dory Horn  . Breast biopsy      Isaiah Blakes  . Total knee arthroplasty      Bilateral x's 2  . Neck surgery      x's 2  . Cardiac catheterization      10/2013  . Left heart catheterization with coronary angiogram N/A 10/25/2013    Procedure: LEFT HEART CATHETERIZATION WITH CORONARY ANGIOGRAM;  Surgeon: Troy Sine, MD;  Location: [facility] CATH LAB;  Service: Cardiovascular;  Laterality: N/A;    History   Social History  . Marital Status: Married    Spouse Name:  N/A  . Number of Children: 3  . Years of Education: N/A   Occupational History  . DISABLED    Social History Main Topics  . Smoking status: Former Smoker -- 2 years    Quit date: 04/06/1941  . Smokeless tobacco: Never Used     Comment: socially smoked x 2 years  . Alcohol Use: No  . Drug Use: No  . Sexual Activity: Not on file   Other Topics Concern  . Not on file   Social History Narrative    Family History  Problem Relation Age of Onset  . Arthritis    . Colon cancer Father   . HIV Brother   . Kidney cancer Brother   . Colon cancer Brother   . Lung cancer Brother   . Other Brother     Mouth Cancer  . Ovarian cancer Mother   . Uterine cancer Mother   . Prostate cancer Father   . Heart disease Maternal Grandmother   . Stroke Maternal Grandmother   . Hypertension Maternal Grandmother   . Diabetes Maternal Grandmother     Current Outpatient Prescriptions on File Prior to Visit  Medication Sig Dispense Refill  . Ascorbic Acid (VITAMIN C PO) Take 2 tablets by mouth daily.     Marland Kitchen  BREWERS YEAST PO Take 2 tablets by mouth 2 (two) times daily.     . clopidogrel (PLAVIX) 75 MG tablet Take 1 tablet (75 mg total) by mouth daily. <please make appointment> 30 tablet 11  . dicyclomine (BENTYL) 20 MG tablet TAKE 1 TABLET BY MOUTH 4 TIMES A DAY AS NEEDED 120 tablet 1  . estradiol (ESTRACE) 1 MG tablet Take 2 mg by mouth daily.     . furosemide (LASIX) 40 MG tablet Take 1 tablet (40 mg total) by mouth 2 (two) times daily. According to how patient feels 60 tablet 5  . potassium chloride SA (KLOR-CON M20) 20 MEQ tablet Take 1 tablet (20 mEq total) by mouth 3 (three) times daily. 90 tablet 6  . SYNTHROID 112 MCG tablet Take 1 tablet (112 mcg total) by mouth daily before breakfast. 30 tablet 2  . vitamin D, CHOLECALCIFEROL, 400 UNITS tablet Take 400 Units by mouth 2 (two) times daily.     Marland Kitchen omeprazole (PRILOSEC) 40 MG capsule Take 1 capsule (40 mg total) by mouth daily. (Patient not  taking: Reported on 10/24/2014) 30 capsule 3   No current facility-administered medications on file prior to visit.    Allergies  Allergen Reactions  . Prednisone Other (See Comments)    Homicidal  . Hydrocodone Itching  . Motrin [Ibuprofen] Other (See Comments)    "Gives false reading in blood"    REVIEW OF SYSTEMS:  (Positives checked otherwise negative)  CARDIOVASCULAR:   [ ]  chest pain,  [ ]  chest pressure,  [x]  palpitations,  [ ]  shortness of breath when laying flat,  [ ]  shortness of breath with exertion,   [ ]  pain in feet when walking,  [ ]  pain in feet when laying flat, [x]  history of blood clot in veins (DVT),  [x]  history of phlebitis,  [x]  swelling in legs,  [ ]  varicose veins  PULMONARY:   [ ]  productive cough,  [ ]  asthma,  [ ]  wheezing  NEUROLOGIC:   [ ]  weakness in arms or legs,  [ ]  numbness in arms or legs,  [ ]  difficulty speaking or slurred speech,  [ ]  temporary loss of vision in one eye,  [ ]  dizziness  HEMATOLOGIC:   [ ]  bleeding problems,  [ ]  problems with blood clotting too easily  MUSCULOSKEL:   [ ]  joint pain, [ ]  joint swelling  GASTROINTEST:   [ ]  vomiting blood,  [ ]  blood in stool     GENITOURINARY:   [ ]  burning with urination,  [ ]  blood in urine  PSYCHIATRIC:   [ ]  history of major depression  INTEGUMENTARY:   [ ]  rashes,  [ ]  ulcers  CONSTITUTIONAL:   [ ]  fever,  [ ]  chills     Physical Examination Filed Vitals:   10/24/14 1039  BP: 139/86  Pulse: 72  Temp: 98.8 F (37.1 C)  TempSrc: Oral  Resp: 18  Height: 5\' 4"  (1.626 m)  Weight: 145 lb (65.772 kg)  SpO2: 97%   Body mass index is 24.88 kg/(m^2).  General: A&O x 3, WDWN  Head: Barrett/AT  Ear/Nose/Throat: Hearing grossly intact, nares w/o erythema or drainage, oropharynx w/o Erythema/Exudate  Eyes: PERRLA, EOMI  Neck: Supple, no nuchal rigidity, no palpable LAD  Pulmonary: Sym exp, good air movt, CTAB, no rales, rhonchi, &  wheezing  Cardiac: RRR, Nl S1, S2, no Murmurs, rubs or gallops  Vascular: Vessel Right Left  Radial Palpable Palpable  Brachial Palpable Palpable  Carotid Palpable, without bruit Palpable, without bruit  Aorta Not palpable N/A  Femoral Palpable Palpable  Popliteal Not palpable Not palpable  PT Faintly Palpable Faintly Palpable  DP Faintly Palpable Faintly Palpable   Gastrointestinal: soft, NTND, -G/R, - HSM, - masses, - CVAT B  Musculoskeletal: M/S 5/5 throughout , Extremities without ischemic changes , B small varicosities, extensive spider veins, mild LDS BLE, large R corona phlebectatica , BLE edema (L1-2+, R 1+)  Neurologic: CN 2-12 intact , Pain and light touch intact in extremities , Motor exam as listed above  Psychiatric: Judgment intact, Mood & affect appropriate for pt's clinical situation  Dermatologic: See M/S exam for extremity exam, no rashes otherwise noted  Lymph : No Cervical, Axillary, or Inguinal lymphadenopathy    Non-Invasive Vascular Imaging  LLE Venous Insufficiency Duplex (Date: 10/24/2014):   No DVT and SVT,   No GSV reflux,   +deep venous reflux: CFV, SFV  Outside Studies/Documentation 4 pages of outside documents were reviewed including: outpatient PCP chart.  Medical Decision Making  Alexandria [name] is a 69 y.o. 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  Patient has no evidence of superficial venous reflux, so EVLA or stab phlebectomy unlikely to benefit her sx.  Pt's disease is not bad enough to merit referral for deep venous valve transplant, which is only done at few vein centers in the country.  I discussed with the patient the use of her 20-30 mm thigh high compression stockings.  She will follow up with [place] in the future if her sx  worsen.  Thank you for allowing [place] to participate in this patient's care.  Adele Barthel, MD [facility] Office: [phone] Pager: [phone]  10/24/2014, 11:31 AM

## 2014-10-25 ENCOUNTER — Ambulatory Visit (INDEPENDENT_AMBULATORY_CARE_PROVIDER_SITE_OTHER): Payer: Medicare Other | Admitting: Family Medicine

## 2014-10-25 ENCOUNTER — Encounter: Payer: Self-pay | Admitting: Family Medicine

## 2014-10-25 VITALS — BP 118/80 | HR 66 | Temp 98.8°F | Wt 150.6 lb

## 2014-10-25 DIAGNOSIS — E039 Hypothyroidism, unspecified: Secondary | ICD-10-CM | POA: Insufficient documentation

## 2014-10-25 DIAGNOSIS — L659 Nonscarring hair loss, unspecified: Secondary | ICD-10-CM

## 2014-10-25 DIAGNOSIS — E038 Other specified hypothyroidism: Secondary | ICD-10-CM | POA: Diagnosis not present

## 2014-10-25 DIAGNOSIS — R5383 Other fatigue: Secondary | ICD-10-CM | POA: Diagnosis not present

## 2014-10-25 LAB — BASIC METABOLIC PANEL
BUN: 21 mg/dL (ref 6–23)
CO2: 32 mEq/L (ref 19–32)
CREATININE: 0.81 mg/dL (ref 0.40–1.20)
Calcium: 9.2 mg/dL (ref 8.4–10.5)
Chloride: 100 mEq/L (ref 96–112)
GFR: 74.33 mL/min (ref >60.00)
GLUCOSE: 83 mg/dL (ref 70–99)
POTASSIUM: 3.3 meq/L — AB (ref 3.5–5.1)
SODIUM: 137 meq/L (ref 135–145)

## 2014-10-25 LAB — CBC WITH DIFFERENTIAL/PLATELET
BASOS PCT: 0.6 % (ref 0.0–3.0)
Basophils Absolute: 0 10*3/uL (ref 0.0–0.1)
Eosinophils Absolute: 0.2 10*3/uL (ref 0.0–0.7)
Eosinophils Relative: 3.7 % (ref 0.0–5.0)
HCT: 40.1 % (ref 36.0–46.0)
HEMOGLOBIN: 13.7 g/dL (ref 12.0–15.0)
LYMPHS ABS: 1.4 10*3/uL (ref 0.7–4.0)
Lymphocytes Relative: 28.8 % (ref 12.0–46.0)
MCHC: 34.1 g/dL (ref 30.0–36.0)
MCV: 96.1 fl (ref 78.0–100.0)
Monocytes Absolute: 0.7 10*3/uL (ref 0.1–1.0)
Monocytes Relative: 14.3 % — ABNORMAL HIGH (ref 3.0–12.0)
Neutro Abs: 2.6 10*3/uL (ref 1.4–7.7)
Neutrophils Relative %: 52.6 % (ref 43.0–77.0)
PLATELETS: 180 10*3/uL (ref 150.0–400.0)
RBC: 4.17 Mil/uL (ref 3.87–5.11)
RDW: 13.5 % (ref 11.5–15.5)
WBC: 4.9 10*3/uL (ref 4.0–10.5)

## 2014-10-25 LAB — HEPATIC FUNCTION PANEL
ALK PHOS: 61 U/L (ref 39–117)
ALT: 17 U/L (ref 0–35)
AST: 21 U/L (ref 0–37)
Albumin: 3.8 g/dL (ref 3.5–5.2)
BILIRUBIN DIRECT: 0.1 mg/dL (ref 0.0–0.3)
Total Bilirubin: 0.5 mg/dL (ref 0.2–1.2)
Total Protein: 6.8 g/dL (ref 6.0–8.3)

## 2014-10-25 LAB — THYROID PANEL WITH TSH
Free Thyroxine Index: 3.5 (ref 1.4–3.8)
T3 UPTAKE: 31 % (ref 22–35)
T4, Total: 11.4 ug/dL (ref 4.5–12.0)
TSH: 0.919 u[IU]/mL (ref 0.350–4.500)

## 2014-10-25 NOTE — Assessment & Plan Note (Signed)
Check labs Thyroid meds were adjusted -- recheck today

## 2014-10-25 NOTE — Progress Notes (Signed)
Patient ID: Alexandria [name], female    DOB: [birth year]  Age: 69 y.o. MRN: [id number]    Subjective:  Subjective HPI Alexandria [name] presents for fatigue since changing thyroid med in June.   Her hair is also falling out in clumps.  She is so tired she falls asleep anywhere.    Review of Systems  Constitutional: Positive for fatigue. Negative for diaphoresis, appetite change and unexpected weight change.  Eyes: Negative for pain, redness and visual disturbance.  Respiratory: Negative for cough, chest tightness, shortness of breath and wheezing.   Cardiovascular: Negative for chest pain, palpitations and leg swelling.  Endocrine: Negative for cold intolerance, heat intolerance, polydipsia, polyphagia and polyuria.  Genitourinary: Negative for dysuria, frequency and difficulty urinating.  Skin: Positive for rash and wound.  Neurological: Negative for dizziness, light-headedness, numbness and headaches.    History Past Medical History  Diagnosis Date  . Arthritis   . Diverticulitis   . Migraines   . DVT (deep venous thrombosis)     lower extremity  . Colon polyp   . Thyroid disease   . TIA (transient ischemic attack)     8 years ago  . Heart attack     10 years ago  . Heart murmur   . H/O cardiac catheterization 2004    Normal coronary arteries  . Hx of echocardiogram 01/27/2010    Normal Ef 55% the transmitral spectral doppler flow pattern is normal for age. the left ventricular wall motion is normal  . History of stress test 05/21/2011    She has past surgical history that includes Vaginal hysterectomy (10/17/1998); Breast biopsy; Total knee arthroplasty; Neck surgery; Cardiac catheterization; and left heart catheterization with coronary angiogram (N/A, 10/25/2013).   Her family history includes Arthritis in an other family member; Colon cancer in her brother and father; Diabetes in her maternal grandmother; HIV in her brother; Heart disease in her maternal  grandmother; Hypertension in her maternal grandmother; Kidney cancer in her brother; Lung cancer in her brother; Other in her brother; Ovarian cancer in her mother; Prostate cancer in her father; Stroke in her maternal grandmother; Uterine cancer in her mother.She reports that she quit smoking about 73 years ago. She has never used smokeless tobacco. She reports that she does not drink alcohol or use illicit drugs.  Current Outpatient Prescriptions on File Prior to Visit  Medication Sig Dispense Refill  . Ascorbic Acid (VITAMIN C PO) Take 2 tablets by mouth daily.     Marland Kitchen BREWERS YEAST PO Take 2 tablets by mouth 2 (two) times daily.     . clopidogrel (PLAVIX) 75 MG tablet Take 1 tablet (75 mg total) by mouth daily. <please make appointment> 30 tablet 11  . dicyclomine (BENTYL) 20 MG tablet TAKE 1 TABLET BY MOUTH 4 TIMES A DAY AS NEEDED 120 tablet 1  . estradiol (ESTRACE) 2 MG tablet     . furosemide (LASIX) 40 MG tablet Take 1 tablet (40 mg total) by mouth 2 (two) times daily. According to how patient feels 60 tablet 5  . Multiple Vitamin (MULTIVITAMIN) tablet Take 1 tablet by mouth daily.    . potassium chloride SA (KLOR-CON M20) 20 MEQ tablet Take 1 tablet (20 mEq total) by mouth 3 (three) times daily. 90 tablet 6  . SYNTHROID 112 MCG tablet Take 1 tablet (112 mcg total) by mouth daily before breakfast. 30 tablet 2  . vitamin D, CHOLECALCIFEROL, 400 UNITS tablet Take 400 Units by mouth 2 (two) times daily.  No current facility-administered medications on file prior to visit.     Objective:  Objective Physical Exam  Constitutional: She is oriented to person, place, and time. She appears well-developed and well-nourished.  HENT:  Head: Normocephalic and atraumatic.  Eyes: Conjunctivae and EOM are normal.  Neck: Normal range of motion. Neck supple. No JVD present. Carotid bruit is not present. No thyromegaly present.  Cardiovascular: Normal rate, regular rhythm and normal heart sounds.     No murmur heard. Pulmonary/Chest: Effort normal and breath sounds normal. No respiratory distress. She has no wheezes. She has no rales. She exhibits no tenderness.  Musculoskeletal: She exhibits no edema.  Neurological: She is alert and oriented to person, place, and time.  Psychiatric: She has a normal mood and affect. Her behavior is normal. Judgment and thought content normal.   BP 118/80 mmHg  Pulse 66  Temp(Src) 98.8 F (37.1 C) (Oral)  Wt 150 lb 9.6 oz (68.312 kg)  SpO2 98% Wt Readings from Last 3 Encounters:  10/25/14 150 lb 9.6 oz (68.312 kg)  10/24/14 145 lb (65.772 kg)  09/07/14 147 lb (66.679 kg)     Lab Results  Component Value Date   WBC 4.9 10/25/2014   HGB 13.7 10/25/2014   HCT 40.1 10/25/2014   PLT 180.0 10/25/2014   GLUCOSE 83 10/25/2014   ALT 17 10/25/2014   AST 21 10/25/2014   NA 137 10/25/2014   K 3.3* 10/25/2014   CL 100 10/25/2014   CREATININE 0.81 10/25/2014   BUN 21 10/25/2014   CO2 32 10/25/2014   TSH 0.919 10/25/2014   INR 1.02 10/20/2013    No results found.   Assessment & Plan:  Plan I have discontinued Ms. Guerrera's omeprazole. I am also having her maintain her vitamin D (CHOLECALCIFEROL), Ascorbic Acid (VITAMIN C PO), BREWERS YEAST PO, clopidogrel, furosemide, potassium chloride SA, dicyclomine, SYNTHROID, multivitamin, and estradiol.  No orders of the defined types were placed in this encounter.    Problem List Items Addressed This Visit    Hypothyroidism - Primary   Relevant Orders   Thyroid Panel With TSH (Completed)   Hair loss    Check labs Thyroid meds were adjusted -- recheck today      Relevant Orders   Basic metabolic panel (Completed)   CBC with Differential/Platelet (Completed)   Hepatic function panel (Completed)   Thyroid Panel With TSH (Completed)   Fatigue    Check labs      Relevant Orders   Basic metabolic panel (Completed)   CBC with Differential/Platelet (Completed)   Hepatic function panel  (Completed)   Thyroid Panel With TSH (Completed)      Follow-up: Return if symptoms worsen or fail to improve.  [name], DO

## 2014-10-25 NOTE — Assessment & Plan Note (Signed)
Check labs 

## 2014-10-25 NOTE — Patient Instructions (Signed)
Hypothyroidism The thyroid is a large gland located in the lower front of your neck. The thyroid gland helps control metabolism. Metabolism is how your body handles food. It controls metabolism with the hormone thyroxine. When this gland is underactive (hypothyroid), it produces too little hormone.  CAUSES These include:   Absence or destruction of thyroid tissue.  Goiter due to iodine deficiency.  Goiter due to medications.  Congenital defects (since birth).  Problems with the pituitary. This causes a lack of TSH (thyroid stimulating hormone). This hormone tells the thyroid to turn out more hormone. SYMPTOMS  Lethargy (feeling as though you have no energy)  Cold intolerance  Weight gain (in spite of normal food intake)  Dry skin  Coarse hair  Menstrual irregularity (if severe, may lead to infertility)  Slowing of thought processes Cardiac problems are also caused by insufficient amounts of thyroid hormone. Hypothyroidism in the newborn is cretinism, and is an extreme form. It is important that this form be treated adequately and immediately or it will lead rapidly to retarded physical and mental development. DIAGNOSIS  To prove hypothyroidism, your caregiver may do blood tests and ultrasound tests. Sometimes the signs are hidden. It may be necessary for your caregiver to watch this illness with blood tests either before or after diagnosis and treatment. TREATMENT  Low levels of thyroid hormone are increased by using synthetic thyroid hormone. This is a safe, effective treatment. It usually takes about four weeks to gain the full effects of the medication. After you have the full effect of the medication, it will generally take another four weeks for problems to leave. Your caregiver may start you on low doses. If you have had heart problems the dose may be gradually increased. It is generally not an emergency to get rapidly to normal. HOME CARE INSTRUCTIONS   Take your  medications as your caregiver suggests. Let your caregiver know of any medications you are taking or start taking. Your caregiver will help you with dosage schedules.  As your condition improves, your dosage needs may increase. It will be necessary to have continuing blood tests as suggested by your caregiver.  Report all suspected medication side effects to your caregiver. SEEK MEDICAL CARE IF: Seek medical care if you develop:  Sweating.  Tremulousness (tremors).  Anxiety.  Rapid weight loss.  Heat intolerance.  Emotional swings.  Diarrhea.  Weakness. SEEK IMMEDIATE MEDICAL CARE IF:  You develop chest pain, an irregular heart beat (palpitations), or a rapid heart beat. MAKE SURE YOU:   Understand these instructions.  Will watch your condition.  Will get help right away if you are not doing well or get worse. Document Released: 03/23/2005 Document Revised: 06/15/2011 Document Reviewed: 11/11/2007 Exit[employer] Patient Information 2015 [employer]. This information is not intended to replace advice given to you by your health care provider. Make sure you discuss any questions you have with your health care provider.  

## 2014-10-25 NOTE — Progress Notes (Signed)
Pre visit review using our clinic review tool, if applicable. No additional management support is needed unless otherwise documented below in the visit note. 

## 2014-10-26 ENCOUNTER — Other Ambulatory Visit: Payer: Self-pay | Admitting: Family Medicine

## 2014-10-26 MED ORDER — SYNTHROID 112 MCG PO TABS: 112.0000 ug | ORAL_TABLET | Freq: Every day | ORAL | Status: DC

## 2014-10-26 NOTE — Telephone Encounter (Signed)
Discussed with patient, who is wanting to change her dose of the thyroid medication. I made her aware her labs were wnl and Dr.Lowne has not made any changes other than decreasing her lasix to 1 a day and to continue the potassium 3 pills daily. She verbalized understanding and agreed to taking the lasix once daily and continuing her potassium as directed. Med's have been faxed.      KP

## 2014-10-26 NOTE — Telephone Encounter (Signed)
Please call patient. She has questions regarding this medication. Also, has questions regarding thyroid. [phone]

## 2014-10-26 NOTE — Telephone Encounter (Signed)
Notes Recorded by Rosalita Chessman, DO on 10/25/2014 at 8:31 PM Try to decrease lasix to 1 a day--- k is low and she is already on 3 a day Let us know if she is unable to dec the dose and we may need to change med

## 2014-11-05 ENCOUNTER — Encounter (HOSPITAL_COMMUNITY): Payer: BLUE CROSS/BLUE SHIELD

## 2014-11-05 ENCOUNTER — Ambulatory Visit: Payer: BLUE CROSS/BLUE SHIELD | Admitting: Vascular Surgery

## 2014-11-14 DIAGNOSIS — L659 Nonscarring hair loss, unspecified: Secondary | ICD-10-CM | POA: Diagnosis not present

## 2014-11-14 DIAGNOSIS — L603 Nail dystrophy: Secondary | ICD-10-CM | POA: Diagnosis not present

## 2014-11-16 ENCOUNTER — Encounter: Payer: Self-pay | Admitting: Physician Assistant

## 2014-11-16 ENCOUNTER — Ambulatory Visit (INDEPENDENT_AMBULATORY_CARE_PROVIDER_SITE_OTHER): Payer: Medicare Other | Admitting: Physician Assistant

## 2014-11-16 ENCOUNTER — Ambulatory Visit (HOSPITAL_BASED_OUTPATIENT_CLINIC_OR_DEPARTMENT_OTHER)
Admission: RE | Admit: 2014-11-16 | Discharge: 2014-11-16 | Disposition: A | Discharge status: Home / Self Care | Payer: Medicare Other | Source: Ambulatory Visit | Attending: Physician Assistant | Admitting: Physician Assistant

## 2014-11-16 VITALS — BP 126/80 | HR 75 | Temp 97.5°F | Ht 64.0 in | Wt 147.6 lb

## 2014-11-16 DIAGNOSIS — S6991XA Unspecified injury of right wrist, hand and finger(s), initial encounter: Secondary | ICD-10-CM

## 2014-11-16 DIAGNOSIS — M25521 Pain in right elbow: Secondary | ICD-10-CM | POA: Insufficient documentation

## 2014-11-16 DIAGNOSIS — M25531 Pain in right wrist: Secondary | ICD-10-CM | POA: Insufficient documentation

## 2014-11-16 DIAGNOSIS — S59901A Unspecified injury of right elbow, initial encounter: Secondary | ICD-10-CM | POA: Diagnosis not present

## 2014-11-16 NOTE — Patient Instructions (Signed)
Please go downstairs for x-ray. I will call you with your results. Wear the brace for the next week. Apply ice and topical Icy Hot to the affected areas. Avoid heavy lifting. Follow-up if not resolving.  If x-ray shows any evidence of fracture, I will set you up with Orthopedic Surgery.

## 2014-11-16 NOTE — Progress Notes (Signed)
Patient presents to clinic today c/o pain in R wrist and elbow after fall onto outstretched hand when getting bumped in hallway by grandson. Denies head injury, LOC. Denies dizziness/lightheadedness preceding fall. Endorses pain with ROM of wrist and elbow. Denies bruising or swelling. Denies numbness or tingling of extremity.  Past Medical History  Diagnosis Date  . Arthritis   . Diverticulitis   . Migraines   . DVT (deep venous thrombosis)     lower extremity  . Colon polyp   . Thyroid disease   . TIA (transient ischemic attack)     8 years ago  . Heart attack     10 years ago  . Heart murmur   . H/O cardiac catheterization 2004    Normal coronary arteries  . Hx of echocardiogram 01/27/2010    Normal Ef 55% the transmitral spectral doppler flow pattern is normal for age. the left ventricular wall motion is normal  . History of stress test 05/21/2011    Current Outpatient Prescriptions on File Prior to Visit  Medication Sig Dispense Refill  . Ascorbic Acid (VITAMIN C PO) Take 2 tablets by mouth daily.     Marland Kitchen BREWERS YEAST PO Take 2 tablets by mouth 2 (two) times daily.     . clopidogrel (PLAVIX) 75 MG tablet Take 1 tablet (75 mg total) by mouth once. 30 tablet 5  . dicyclomine (BENTYL) 20 MG tablet TAKE 1 TABLET BY MOUTH 4 TIMES A DAY AS NEEDED 120 tablet 5  . estradiol (ESTRACE) 2 MG tablet     . furosemide (LASIX) 40 MG tablet Take 1 tablet (40 mg total) by mouth 2 (two) times daily. According to how patient feels 60 tablet 5  . Multiple Vitamin (MULTIVITAMIN) tablet Take 1 tablet by mouth daily.    . potassium chloride SA (KLOR-CON M20) 20 MEQ tablet Take 1 tablet (20 mEq total) by mouth 3 (three) times daily. 90 tablet 6  . SYNTHROID 112 MCG tablet Take 1 tablet (112 mcg total) by mouth daily before breakfast. 30 tablet 5  . vitamin D, CHOLECALCIFEROL, 400 UNITS tablet Take 400 Units by mouth 2 (two) times daily.      No current facility-administered medications on file  prior to visit.    Allergies  Allergen Reactions  . Prednisone Other (See Comments)    Homicidal  . Hydrocodone Itching  . Motrin [Ibuprofen] Other (See Comments)    "Gives false reading in blood"    Family History  Problem Relation Age of Onset  . Arthritis    . Colon cancer Father   . HIV Brother   . Kidney cancer Brother   . Colon cancer Brother   . Lung cancer Brother   . Other Brother     Mouth Cancer  . Ovarian cancer Mother   . Uterine cancer Mother   . Prostate cancer Father   . Heart disease Maternal Grandmother   . Stroke Maternal Grandmother   . Hypertension Maternal Grandmother   . Diabetes Maternal Grandmother     Social History   Social History  . Marital Status: Married    Spouse Name: N/A  . Number of Children: 3  . Years of Education: N/A   Occupational History  . DISABLED    Social History Main Topics  . Smoking status: Former Smoker -- 2 years    Quit date: 04/06/1941  . Smokeless tobacco: Never Used     Comment: socially smoked x 2 years  .  Alcohol Use: No  . Drug Use: No  . Sexual Activity: Not Asked   Other Topics Concern  . None   Social History Narrative   Review of Systems - See HPI.  All other ROS are negative.  BP 126/80 mmHg  Pulse 75  Temp(Src) 97.5 F (36.4 C) (Oral)  Ht $R'5\' 4"'Oc$  (1.626 m)  Wt 147 lb 9.6 oz (66.951 kg)  BMI 25.32 kg/m2  SpO2 97%  Physical Exam  Constitutional: She is oriented to person, place, and time and well-developed, well-nourished, and in no distress.  HENT:  Head: Normocephalic and atraumatic.  Eyes: Conjunctivae are normal.  Neck: Neck supple.  Cardiovascular: Normal rate, regular rhythm, normal heart sounds and intact distal pulses.   Pulmonary/Chest: Effort normal and breath sounds normal. No respiratory distress. She has no wheezes. She has no rales. She exhibits no tenderness.  Musculoskeletal:       Right elbow: She exhibits normal range of motion and no swelling. Tenderness found.  Radial head tenderness noted.       Right wrist: She exhibits tenderness and bony tenderness. She exhibits no swelling.  Neurological: She is alert and oriented to person, place, and time.  Skin: Skin is warm and dry. No rash noted.  Psychiatric: Affect normal.  Vitals reviewed.   Recent Results (from the past 2160 hour(s))  Sed Rate (ESR)     Status: Abnormal   Collection Time: 09/07/14  1:58 PM  Result Value Ref Range   Sed Rate 25 (H) 0 - 22 mm/hr  TSH     Status: Abnormal   Collection Time: 10/03/14  2:14 PM  Result Value Ref Range   TSH 0.18 (L) 0.35 - 4.50 uIU/mL  Basic metabolic panel     Status: Abnormal   Collection Time: 10/25/14  9:43 AM  Result Value Ref Range   Sodium 137 135 - 145 mEq/L   Potassium 3.3 (L) 3.5 - 5.1 mEq/L   Chloride 100 96 - 112 mEq/L   CO2 32 19 - 32 mEq/L   Glucose, Bld 83 70 - 99 mg/dL   BUN 21 6 - 23 mg/dL   Creatinine, Ser 0.81 0.40 - 1.20 mg/dL   Calcium 9.2 8.4 - 10.5 mg/dL   GFR 74.33 >60.00 mL/min  CBC with Differential/Platelet     Status: Abnormal   Collection Time: 10/25/14  9:43 AM  Result Value Ref Range   WBC 4.9 4.0 - 10.5 K/uL   RBC 4.17 3.87 - 5.11 Mil/uL   Hemoglobin 13.7 12.0 - 15.0 g/dL   HCT 40.1 36.0 - 46.0 %   MCV 96.1 78.0 - 100.0 fl   MCHC 34.1 30.0 - 36.0 g/dL   RDW 13.5 11.5 - 15.5 %   Platelets 180.0 150.0 - 400.0 K/uL   Neutrophils Relative % 52.6 43.0 - 77.0 %   Lymphocytes Relative 28.8 12.0 - 46.0 %   Monocytes Relative 14.3 (H) 3.0 - 12.0 %   Eosinophils Relative 3.7 0.0 - 5.0 %   Basophils Relative 0.6 0.0 - 3.0 %   Neutro Abs 2.6 1.4 - 7.7 K/uL   Lymphs Abs 1.4 0.7 - 4.0 K/uL   Monocytes Absolute 0.7 0.1 - 1.0 K/uL   Eosinophils Absolute 0.2 0.0 - 0.7 K/uL   Basophils Absolute 0.0 0.0 - 0.1 K/uL  Hepatic function panel     Status: None   Collection Time: 10/25/14  9:43 AM  Result Value Ref Range   Total Bilirubin 0.5 0.2 - 1.2  mg/dL   Bilirubin, Direct 0.1 0.0 - 0.3 mg/dL   Alkaline Phosphatase  61 39 - 117 U/L   AST 21 0 - 37 U/L   ALT 17 0 - 35 U/L   Total Protein 6.8 6.0 - 8.3 g/dL   Albumin 3.8 3.5 - 5.2 g/dL  Thyroid Panel With TSH     Status: None   Collection Time: 10/25/14  9:43 AM  Result Value Ref Range   T4, Total 11.4 4.5 - 12.0 ug/dL   T3 Uptake 31 22 - 35 %   Free Thyroxine Index 3.5 1.4 - 3.8   TSH 0.919 0.350 - 4.500 uIU/mL    Assessment/Plan: Right wrist injury Will obtain x-ray of wrist and elbow -- Addendum: negative for fracture.   Bracing applied. Patient refuses Rx pain medication. OTC medications discussed. RICE discussed. Follow-up if not improving.

## 2014-11-16 NOTE — Progress Notes (Signed)
Pre visit review using our clinic review tool, if applicable. No additional management support is needed unless otherwise documented below in the visit note. 

## 2014-11-18 DIAGNOSIS — S6991XA Unspecified injury of right wrist, hand and finger(s), initial encounter: Secondary | ICD-10-CM | POA: Insufficient documentation

## 2014-11-18 NOTE — Assessment & Plan Note (Signed)
Will obtain x-ray of wrist and elbow -- Addendum: negative for fracture.   Bracing applied. Patient refuses Rx pain medication. OTC medications discussed. RICE discussed. Follow-up if not improving.

## 2014-11-21 ENCOUNTER — Other Ambulatory Visit: Payer: Self-pay | Admitting: Cardiology

## 2014-11-21 ENCOUNTER — Telehealth: Payer: Self-pay | Admitting: Family Medicine

## 2014-11-21 NOTE — Telephone Encounter (Signed)
REFILL 

## 2014-11-21 NOTE — Telephone Encounter (Signed)
[facility] - Client [facility] Patient Name: Alexandria [name] DOB: 1945-11-13 Initial Comment Caller states that she is dizzy, and has heart palpitations Nurse Assessment Nurse: Malva Cogan, RN, Juliann Pulse Date/Time Eilene Ghazi Time): 11/21/2014 4:08:40 PM Confirm and document reason for call. If symptomatic, describe symptoms. ---Caller states that she had onset of dizziness & heart palpitations this AM which she states she usually has when her K+ level is low. Reports that she has a scheduled appt tomorrow at 1000 with her PCP. Is not having any palpitations or dizziness now. Has the patient traveled out of the country within the last 30 days? ---No Does the patient require triage? ---Yes Related visit to physician within the last 2 weeks? ---No Does the PT have any chronic conditions? (i.e. diabetes, asthma, etc.) ---Yes List chronic conditions. ---hypokalemia, hypothyroidism Guidelines Guideline Title Affirmed Question Affirmed Notes Heart Rate and Heartbeat Questions Taking water pill (i.e., diuretic) or heart medication (e.g., digoxin) Final Disposition User See Physician within 4 Hours (or PCP triage) Malva Cogan, RN, [address] office number to advise that pt needs to be seen within 4 hrs & that there were not any appts available within that time frame but was on hold for > 3" & no one ever answered phone. Will advise caller that she needs to be seen in [place] within next 3-4 hrs. Caller advised that she needs to be seen within next 3-4 hrs in either ED or [facility] as there are not any available appts, does not know where she will be going to be seen yet. Referrals GO TO FACILITY UNDECIDED Disagree/Comply: Comply

## 2014-11-22 ENCOUNTER — Encounter: Payer: Self-pay | Admitting: Family Medicine

## 2014-11-22 ENCOUNTER — Ambulatory Visit (INDEPENDENT_AMBULATORY_CARE_PROVIDER_SITE_OTHER): Payer: Medicare Other | Admitting: Family Medicine

## 2014-11-22 VITALS — BP 130/82 | HR 64 | Temp 98.8°F | Wt 141.8 lb

## 2014-11-22 DIAGNOSIS — I10 Essential (primary) hypertension: Secondary | ICD-10-CM

## 2014-11-22 DIAGNOSIS — E876 Hypokalemia: Secondary | ICD-10-CM | POA: Diagnosis not present

## 2014-11-22 DIAGNOSIS — R002 Palpitations: Secondary | ICD-10-CM | POA: Diagnosis not present

## 2014-11-22 LAB — POTASSIUM: Potassium: 2.9 mEq/L — ABNORMAL LOW (ref 3.5–5.1)

## 2014-11-22 MED ORDER — SPIRONOLACTONE 25 MG PO TABS: 25.0000 mg | ORAL_TABLET | Freq: Every day | ORAL | Status: DC

## 2014-11-22 NOTE — Progress Notes (Signed)
Pre visit review using our clinic review tool, if applicable. No additional management support is needed unless otherwise documented below in the visit note. 

## 2014-11-22 NOTE — Telephone Encounter (Signed)
Called patient to follow-up and she has appointment at 10:00 today 11/22/14.

## 2014-11-22 NOTE — Progress Notes (Signed)
Patient ID: Alexandria [name], female    DOB: 24-Jun-1945  Age: 69 y.o. MRN: [id number]    Subjective:  Subjective HPI [name] presents f/u palpitations.  She said when his happens her K has been low in the past.  Last nights episode was worse than usual.  No symptoms now.    Review of Systems  Constitutional: Negative for diaphoresis, appetite change, fatigue and unexpected weight change.  Eyes: Negative for pain, redness and visual disturbance.  Respiratory: Negative for cough, chest tightness, shortness of breath and wheezing.   Cardiovascular: Positive for palpitations. Negative for chest pain and leg swelling.  Endocrine: Negative for cold intolerance, heat intolerance, polydipsia, polyphagia and polyuria.  Genitourinary: Negative for dysuria, frequency and difficulty urinating.  Neurological: Negative for dizziness, light-headedness, numbness and headaches.    History Past Medical History  Diagnosis Date  . Arthritis   . Diverticulitis   . Migraines   . DVT (deep venous thrombosis)     lower extremity  . Colon polyp   . Thyroid disease   . TIA (transient ischemic attack)     8 years ago  . Heart attack     10 years ago  . Heart murmur   . H/O cardiac catheterization 2004    Normal coronary arteries  . Hx of echocardiogram 01/27/2010    Normal Ef 55% the transmitral spectral doppler flow pattern is normal for age. the left ventricular wall motion is normal  . History of stress test 05/21/2011    She has past surgical history that includes Vaginal hysterectomy (10/17/1998); Breast biopsy; Total knee arthroplasty; Neck surgery; Cardiac catheterization; and left heart catheterization with coronary angiogram (N/A, 10/25/2013).   Her family history includes Arthritis in an other family member; Colon cancer in her brother and father; Diabetes in her maternal grandmother; HIV in her brother; Heart disease in her maternal grandmother; Hypertension in her maternal  grandmother; Kidney cancer in her brother; Lung cancer in her brother; Other in her brother; Ovarian cancer in her mother; Prostate cancer in her father; Stroke in her maternal grandmother; Uterine cancer in her mother.She reports that she quit smoking about 73 years ago. She has never used smokeless tobacco. She reports that she does not drink alcohol or use illicit drugs.  Current Outpatient Prescriptions on File Prior to Visit  Medication Sig Dispense Refill  . Ascorbic Acid (VITAMIN C PO) Take 2 tablets by mouth daily.     Marland Kitchen BREWERS YEAST PO Take 2 tablets by mouth 2 (two) times daily.     . clopidogrel (PLAVIX) 75 MG tablet Take 1 tablet (75 mg total) by mouth once. 30 tablet 5  . dicyclomine (BENTYL) 20 MG tablet TAKE 1 TABLET BY MOUTH 4 TIMES A DAY AS NEEDED 120 tablet 5  . estradiol (ESTRACE) 2 MG tablet     . Multiple Vitamin (MULTIVITAMIN) tablet Take 1 tablet by mouth daily.    Marland Kitchen SYNTHROID 112 MCG tablet Take 1 tablet (112 mcg total) by mouth daily before breakfast. 30 tablet 5  . vitamin D, CHOLECALCIFEROL, 400 UNITS tablet Take 400 Units by mouth 2 (two) times daily.      No current facility-administered medications on file prior to visit.     Objective:  Objective Physical Exam  Constitutional: She is oriented to person, place, and time. She appears well-developed and well-nourished.  HENT:  Head: Normocephalic and atraumatic.  Eyes: Conjunctivae and EOM are normal.  Neck: Normal range of motion. Neck supple. No  JVD present. Carotid bruit is not present. No thyromegaly present.  Cardiovascular: Normal rate, regular rhythm and normal heart sounds.   No murmur heard. Pulmonary/Chest: Effort normal and breath sounds normal. No respiratory distress. She has no wheezes. She has no rales. She exhibits no tenderness.  Musculoskeletal: She exhibits no edema.  Neurological: She is alert and oriented to person, place, and time.  Psychiatric: She has a normal mood and affect. Her  behavior is normal.   BP 130/82 mmHg  Pulse 64  Temp(Src) 98.8 F (37.1 C) (Oral)  Wt 141 lb 12.8 oz (64.32 kg)  SpO2 98% Wt Readings from Last 3 Encounters:  11/22/14 141 lb 12.8 oz (64.32 kg)  11/16/14 147 lb 9.6 oz (66.951 kg)  10/25/14 150 lb 9.6 oz (68.312 kg)     Lab Results  Component Value Date   WBC 4.9 10/25/2014   HGB 13.7 10/25/2014   HCT 40.1 10/25/2014   PLT 180.0 10/25/2014   GLUCOSE 83 10/25/2014   ALT 17 10/25/2014   AST 21 10/25/2014   NA 137 10/25/2014   K 2.9* 11/22/2014   CL 100 10/25/2014   CREATININE 0.81 10/25/2014   BUN 21 10/25/2014   CO2 32 10/25/2014   TSH 0.919 10/25/2014   INR 1.02 10/20/2013    Dg Elbow Complete Right  11/16/2014   CLINICAL DATA:  Struck RIGHT arm against wall 1 week ago, fell on outstretched hand, pain and swelling, RIGHT elbow pain and wrist pain, initial encounter  EXAM: RIGHT ELBOW - COMPLETE 3+ VIEW  COMPARISON:  None  FINDINGS: Bone mineralization normal.  Joint spaces preserved.  No fracture, dislocation, or bone destruction.  No definite joint effusion.  IMPRESSION: Normal exam.   Electronically Signed   By: Lavonia Dana M.D.   On: 11/16/2014 14:30   Dg Wrist Complete Right  11/16/2014   CLINICAL DATA:  Struck RIGHT arm against wall 1 week ago, fell on outstretched hand, pain and swelling, RIGHT elbow pain and wrist pain, initial encounter  EXAM: RIGHT WRIST - COMPLETE 3+ VIEW  COMPARISON:  None  FINDINGS: Osseous demineralization.  Joint space narrowing at distal pole scaphoid and at first [facility] joint.  No acute fracture, dislocation, or bone destruction.  IMPRESSION: No acute osseous abnormalities.  Osseous demineralization with mild degenerative changes at radial border of carpus.   Electronically Signed   By: Lavonia Dana M.D.   On: 11/16/2014 14:28     Assessment & Plan:  Plan I have discontinued Ms. Stettler's potassium chloride SA and furosemide. I am also having her start on spironolactone. Additionally, I am having  her maintain her vitamin D (CHOLECALCIFEROL), Ascorbic Acid (VITAMIN C PO), BREWERS YEAST PO, multivitamin, estradiol, dicyclomine, clopidogrel, SYNTHROID, and Biotin.  Meds ordered this encounter  Medications  . Biotin (RA BIOTIN) 1000 MCG tablet    Sig: Take 1,000 mcg by mouth daily.   Marland Kitchen spironolactone (ALDACTONE) 25 MG tablet    Sig: Take 1 tablet (25 mg total) by mouth daily.    Dispense:  30 tablet    Refill:  2  EKG--- RBBB ---not new  Problem List Items Addressed This Visit    None    Visit Diagnoses    Heart palpitations    -  Primary    Relevant Orders    EKG 12-Lead (Completed)    Essential hypertension        Relevant Medications    spironolactone (ALDACTONE) 25 MG tablet    Hypokalemia  Relevant Orders    Potassium (Completed)       Follow-up: No Follow-up on file.  [name], DO

## 2014-11-23 ENCOUNTER — Other Ambulatory Visit: Payer: Self-pay

## 2014-11-23 DIAGNOSIS — E876 Hypokalemia: Secondary | ICD-10-CM

## 2014-11-26 ENCOUNTER — Telehealth: Payer: Self-pay | Admitting: Family Medicine

## 2014-11-26 DIAGNOSIS — Z1321 Encounter for screening for nutritional disorder: Secondary | ICD-10-CM | POA: Diagnosis not present

## 2014-11-26 DIAGNOSIS — N951 Menopausal and female climacteric states: Secondary | ICD-10-CM | POA: Diagnosis not present

## 2014-11-26 NOTE — Telephone Encounter (Signed)
Spoke with patient and I made her aware that she is to stop the furosemide and start the spironolactone, she verbalized understanding.     KP

## 2014-11-26 NOTE — Telephone Encounter (Signed)
Relation to pt: self Call back number: [phone] Pharmacy:  Reason for call:  Patient states MD replaced furosemide (LASIX) 40 MG tablet  With spironolactone (ALDACTONE) 25 MG tablet and would like to know should she d/c furosemide

## 2014-11-28 ENCOUNTER — Other Ambulatory Visit: Payer: Self-pay | Admitting: Family Medicine

## 2014-11-28 NOTE — Telephone Encounter (Signed)
Spoke with pt. Seen in office on 11/22/14. States she has tried weaning off Potassium as she was instructed. Only took 1 yesterday. Had slight symptoms of "fuzzy head like she might pass out but doesn't". States these are not new symptoms for her. Symptoms were stronger today and she has had to take 2 potassium tablets. States she was going to take 1/2 tablet around 4 or 5pm if symptoms still persist. Did not start spironolactone until yesterday. She has scheduled lab appt for tomorrow at 1:15pm. Future lab order entered. Pt denies chest, arm, jaw pain or numbness, no shortness of breath. Reports she will go to the ER if these symptoms present. Please advise?

## 2014-11-28 NOTE — Telephone Encounter (Signed)
°  Relation to JH:HIDU Call back [place]:  Reason for call: pt would like for you to give her call, states she was in on 11/22/14 and dr. Etter Sjogren changed her meds to spironolactone (ALDACTONE) 25 MG tablet, pt states she still does not feel good at all. Pt refused triage, states since she knows it is her potassium she just needs dr. Etter Sjogren to make adjustments again or tell her what options she has. Pt states if she gets really bad then she will go to the ER.

## 2014-11-29 ENCOUNTER — Other Ambulatory Visit (INDEPENDENT_AMBULATORY_CARE_PROVIDER_SITE_OTHER): Payer: Medicare Other

## 2014-11-29 DIAGNOSIS — E039 Hypothyroidism, unspecified: Secondary | ICD-10-CM

## 2014-11-29 DIAGNOSIS — E876 Hypokalemia: Secondary | ICD-10-CM | POA: Diagnosis not present

## 2014-11-29 NOTE — Telephone Encounter (Signed)
She does not need to wean off potassium-- she can just stop it

## 2014-11-29 NOTE — Telephone Encounter (Signed)
Provider's recommendation below discussed.  She stated understanding.  She inquired about K+ level.  She was informed that no results were back yet.  She said ok.  No additional needs voiced.

## 2014-11-30 ENCOUNTER — Telehealth: Payer: Self-pay | Admitting: Family Medicine

## 2014-11-30 ENCOUNTER — Other Ambulatory Visit: Payer: Self-pay | Admitting: Physician Assistant

## 2014-11-30 ENCOUNTER — Ambulatory Visit (HOSPITAL_BASED_OUTPATIENT_CLINIC_OR_DEPARTMENT_OTHER)
Admission: RE | Admit: 2014-11-30 | Discharge: 2014-11-30 | Disposition: A | Discharge status: Home / Self Care | Payer: Medicare Other | Source: Ambulatory Visit | Attending: Physician Assistant | Admitting: Physician Assistant

## 2014-11-30 DIAGNOSIS — M25531 Pain in right wrist: Secondary | ICD-10-CM | POA: Insufficient documentation

## 2014-11-30 DIAGNOSIS — M25431 Effusion, right wrist: Secondary | ICD-10-CM | POA: Diagnosis not present

## 2014-11-30 DIAGNOSIS — S63501A Unspecified sprain of right wrist, initial encounter: Secondary | ICD-10-CM | POA: Diagnosis not present

## 2014-11-30 DIAGNOSIS — S6991XS Unspecified injury of right wrist, hand and finger(s), sequela: Secondary | ICD-10-CM

## 2014-11-30 DIAGNOSIS — S6991XA Unspecified injury of right wrist, hand and finger(s), initial encounter: Secondary | ICD-10-CM | POA: Diagnosis not present

## 2014-11-30 LAB — TSH: TSH: 1.24 u[IU]/mL (ref 0.35–4.50)

## 2014-11-30 LAB — POTASSIUM: POTASSIUM: 4 meq/L (ref 3.5–5.1)

## 2014-11-30 NOTE — Telephone Encounter (Signed)
°  Relation to KF:MMCR Call back [place]:  Reason for call: pt saw on 11/16/14 , states you  informed her that if her wrist did not get any better to contact you back so that she could have another x-ray done. Pt states her wrist is still swollen and painful.

## 2014-11-30 NOTE — Telephone Encounter (Signed)
She can come to [place] today for x-ray. I will call her with her results. If fracture shows up then we will get her in with Ortho today. If x-ray is still negative I will refer her to Sports Medicine for assessment.

## 2014-12-03 ENCOUNTER — Telehealth: Payer: Self-pay | Admitting: Family Medicine

## 2014-12-03 DIAGNOSIS — T148XXA Other injury of unspecified body region, initial encounter: Secondary | ICD-10-CM

## 2014-12-03 NOTE — Telephone Encounter (Signed)
Did she give you a reason why?

## 2014-12-03 NOTE — Telephone Encounter (Signed)
Relation to UP:JSRP Call back number:[phone]  Reason for call:  Patient requesting an order for MRI right hand. Please advise

## 2014-12-04 NOTE — Telephone Encounter (Signed)
Pt says that she was told by the [facility] that she had to have on in 2 weeks of the visit.   Please call back to advise.

## 2014-12-04 NOTE — Telephone Encounter (Signed)
MRI ordered.     KP

## 2014-12-04 NOTE — Telephone Encounter (Signed)
Dr.Lowne did the patient mention this at her visit.       KP

## 2014-12-04 NOTE — Telephone Encounter (Signed)
No-- i don't know about this

## 2014-12-04 NOTE — Telephone Encounter (Signed)
Seen by Einar Pheasant on 11/30/14: wrist X-ray report and she was sent to [facility]. No report from that facility yet. I have tried to call the patient.  Please advise  IMPRESSION: 1. Questionable small fracture of the trapezium versus marginal spurring, at the trapezium first metacarpal articulation. Spurring is felt most likely. 2. No other evidence of a fracture. No dislocation.

## 2014-12-04 NOTE — Telephone Encounter (Signed)
Ok to order MRI w/o contrast

## 2014-12-05 ENCOUNTER — Other Ambulatory Visit: Payer: Self-pay | Admitting: Family Medicine

## 2014-12-05 DIAGNOSIS — S62171A Displaced fracture of trapezium [larger multangular], right wrist, initial encounter for closed fracture: Secondary | ICD-10-CM

## 2014-12-05 DIAGNOSIS — M25531 Pain in right wrist: Secondary | ICD-10-CM

## 2014-12-05 DIAGNOSIS — R609 Edema, unspecified: Secondary | ICD-10-CM

## 2014-12-05 DIAGNOSIS — R52 Pain, unspecified: Secondary | ICD-10-CM

## 2014-12-11 ENCOUNTER — Telehealth: Payer: Self-pay

## 2014-12-11 ENCOUNTER — Ambulatory Visit
Admission: RE | Admit: 2014-12-11 | Discharge: 2014-12-11 | Disposition: A | Discharge status: Home / Self Care | Payer: Medicare Other | Source: Ambulatory Visit | Attending: Family Medicine | Admitting: Family Medicine

## 2014-12-11 DIAGNOSIS — M25531 Pain in right wrist: Secondary | ICD-10-CM

## 2014-12-11 NOTE — Telephone Encounter (Signed)
Spoke with Colletta Maryland at [facility] and she stated that the patient is at the facility and unable to tolerate the MRI and they are suggesting a CT w/o contrast. Dr.Lowne gave the verbal to complete the CT scan and Marj advised no Authorization is needed. I made [facility] aware.     KP

## 2014-12-12 ENCOUNTER — Other Ambulatory Visit: Payer: Self-pay

## 2014-12-12 DIAGNOSIS — M25531 Pain in right wrist: Secondary | ICD-10-CM

## 2014-12-13 ENCOUNTER — Other Ambulatory Visit: Payer: Self-pay

## 2014-12-13 MED ORDER — LACTULOSE 10 GM/15ML PO SOLN: ORAL | Status: DC

## 2014-12-13 NOTE — Telephone Encounter (Signed)
Refill requested and the patient is requesting Lactulose 10 mg. We have not filled it since 2014. Please advise    KP

## 2014-12-17 ENCOUNTER — Ambulatory Visit (INDEPENDENT_AMBULATORY_CARE_PROVIDER_SITE_OTHER): Payer: Medicare Other | Admitting: Family

## 2014-12-17 ENCOUNTER — Encounter: Payer: Self-pay | Admitting: Family

## 2014-12-17 VITALS — BP 118/78 | HR 72 | Temp 98.0°F | Resp 16 | Ht 64.0 in | Wt 147.8 lb

## 2014-12-17 DIAGNOSIS — R3915 Urgency of urination: Secondary | ICD-10-CM | POA: Diagnosis not present

## 2014-12-17 DIAGNOSIS — M25331 Other instability, right wrist: Secondary | ICD-10-CM | POA: Diagnosis not present

## 2014-12-17 DIAGNOSIS — B3749 Other urogenital candidiasis: Secondary | ICD-10-CM | POA: Diagnosis not present

## 2014-12-17 LAB — POCT URINALYSIS DIPSTICK
Bilirubin, UA: NEGATIVE
GLUCOSE UA: NEGATIVE
Ketones, UA: NEGATIVE
Leukocytes, UA: NEGATIVE
NITRITE UA: NEGATIVE
PH UA: 5
PROTEIN UA: NEGATIVE
UROBILINOGEN UA: NEGATIVE

## 2014-12-17 MED ORDER — SULFAMETHOXAZOLE-TRIMETHOPRIM 800-160 MG PO TABS: 1.0000 | ORAL_TABLET | Freq: Two times a day (BID) | ORAL | Status: DC

## 2014-12-17 NOTE — Progress Notes (Signed)
Subjective:    Patient ID: Alexandria [name], female    DOB: [birth year], 69 y.o.   MRN: [id number]  HPI  [name] is a 69 yr old female who presents today with chief complaint of urinary urgency. Symptoms began yesterday. She reports + associated bladder pressure, frequency. Denies associated gross hematuria. She had sweats/chills on Saturday night.  She denies low back pain.    Review of Systems    see HPI  Past Medical History  Diagnosis Date  . Arthritis   . Diverticulitis   . Migraines   . DVT (deep venous thrombosis)     lower extremity  . Colon polyp   . Thyroid disease   . TIA (transient ischemic attack)     8 years ago  . Heart attack     10 years ago  . Heart murmur   . H/O cardiac catheterization 2004    Normal coronary arteries  . Hx of echocardiogram 01/27/2010    Normal Ef 55% the transmitral spectral doppler flow pattern is normal for age. the left ventricular wall motion is normal  . History of stress test 05/21/2011    Social History   Social History  . Marital Status: Married    Spouse Name: N/A  . Number of Children: 3  . Years of Education: N/A   Occupational History  . DISABLED    Social History Main Topics  . Smoking status: Former Smoker -- 2 years    Quit date: 04/06/1941  . Smokeless tobacco: Never Used     Comment: socially smoked x 2 years  . Alcohol Use: No  . Drug Use: No  . Sexual Activity: Not on file   Other Topics Concern  . Not on file   Social History Narrative    Past Surgical History  Procedure Laterality Date  . Vaginal hysterectomy  10/17/1998    Dory Horn  . Breast biopsy      Isaiah Blakes  . Total knee arthroplasty      Bilateral x's 2  . Neck surgery      x's 2  . Cardiac catheterization      10/2013  . Left heart catheterization with coronary angiogram N/A 10/25/2013    Procedure: LEFT HEART CATHETERIZATION WITH CORONARY ANGIOGRAM;  Surgeon: Troy Sine, MD;  Location: [facility] CATH LAB;  Service:  Cardiovascular;  Laterality: N/A;    Family History  Problem Relation Age of Onset  . Arthritis    . Colon cancer Father   . HIV Brother   . Kidney cancer Brother   . Colon cancer Brother   . Lung cancer Brother   . Other Brother     Mouth Cancer  . Ovarian cancer Mother   . Uterine cancer Mother   . Prostate cancer Father   . Heart disease Maternal Grandmother   . Stroke Maternal Grandmother   . Hypertension Maternal Grandmother   . Diabetes Maternal Grandmother     Allergies  Allergen Reactions  . Prednisone Other (See Comments)    Homicidal  . Hydrocodone Itching  . Motrin [Ibuprofen] Other (See Comments)    "Gives false reading in blood"    Current Outpatient Prescriptions on File Prior to Visit  Medication Sig Dispense Refill  . Ascorbic Acid (VITAMIN C PO) Take 2 tablets by mouth daily.     . Biotin (RA BIOTIN) 1000 MCG tablet Take 1,000 mcg by mouth 2 (two) times daily.     Marland Kitchen BREWERS YEAST PO  Take 2 tablets by mouth 2 (two) times daily.     . clopidogrel (PLAVIX) 75 MG tablet Take 1 tablet (75 mg total) by mouth once. 30 tablet 5  . dicyclomine (BENTYL) 20 MG tablet TAKE 1 TABLET BY MOUTH 4 TIMES A DAY AS NEEDED 120 tablet 5  . estradiol (ESTRACE) 2 MG tablet Take 2 mg by mouth daily.     Marland Kitchen lactulose (CHRONULAC) 10 GM/15ML solution TAKE 30 MLS (20 G TOTAL) BY MOUTH EVERY 6 (SIX) HOURS AS NEEDED. 1892 mL 1  . Multiple Vitamin (MULTIVITAMIN) tablet Take 1 tablet by mouth daily.    Marland Kitchen spironolactone (ALDACTONE) 25 MG tablet Take 1 tablet (25 mg total) by mouth daily. 30 tablet 2  . SYNTHROID 112 MCG tablet Take 1 tablet (112 mcg total) by mouth daily before breakfast. 30 tablet 5  . vitamin D, CHOLECALCIFEROL, 400 UNITS tablet Take 400 Units by mouth 2 (two) times daily.      No current facility-administered medications on file prior to visit.    BP 118/78 mmHg  Pulse 72  Temp(Src) 98 F (36.7 C) (Oral)  Resp 16  Ht 5\' 4"  (1.626 m)  Wt 147 lb 12.8 oz (67.042  kg)  BMI 25.36 kg/m2  SpO2 97%    Objective:   Physical Exam  Constitutional: She appears well-developed and well-nourished.  Cardiovascular: Normal rate, regular rhythm and normal heart sounds.   No murmur heard. Pulmonary/Chest: Effort normal and breath sounds normal. No respiratory distress. She has no wheezes.  Abdominal: Soft.  Mild suprapubic tenderness to palpation  Genitourinary:  Neg CVAT bilaterally  Psychiatric: She has a normal mood and affect. Her behavior is normal. Judgment and thought content normal.          Assessment & Plan:  UTI- UA trace blood, otherwise negative. Unable to provide enough urine for culture.  Will rx with bactrim ds.  Pt advised to call if symptoms worsen or if symptoms do not improve in 3 days. She verbalizes understanding.

## 2014-12-17 NOTE — Patient Instructions (Signed)
Start bactrim for urinary tract infection. Call if symptoms worsen or if not improved in 2-3 days.  Urinary Tract Infection Urinary tract infections (UTIs) can develop anywhere along your urinary tract. Your urinary tract is your body's drainage system for removing wastes and extra water. Your urinary tract includes two kidneys, two ureters, a bladder, and a urethra. Your kidneys are a pair of bean-shaped organs. Each kidney is about the size of your fist. They are located below your ribs, one on each side of your spine. CAUSES Infections are caused by microbes, which are microscopic organisms, including fungi, viruses, and bacteria. These organisms are so small that they can only be seen through a microscope. Bacteria are the microbes that most commonly cause UTIs. SYMPTOMS  Symptoms of UTIs may vary by age and gender of the patient and by the location of the infection. Symptoms in young women typically include a frequent and intense urge to urinate and a painful, burning feeling in the bladder or urethra during urination. Older women and men are more likely to be tired, shaky, and weak and have muscle aches and abdominal pain. A fever may mean the infection is in your kidneys. Other symptoms of a kidney infection include pain in your back or sides below the ribs, nausea, and vomiting. DIAGNOSIS To diagnose a UTI, your caregiver will ask you about your symptoms. Your caregiver also will ask to provide a urine sample. The urine sample will be tested for bacteria and white blood cells. White blood cells are made by your body to help fight infection. TREATMENT  Typically, UTIs can be treated with medication. Because most UTIs are caused by a bacterial infection, they usually can be treated with the use of antibiotics. The choice of antibiotic and length of treatment depend on your symptoms and the type of bacteria causing your infection. HOME CARE INSTRUCTIONS  If you were prescribed antibiotics, take  them exactly as your caregiver instructs you. Finish the medication even if you feel better after you have only taken some of the medication.  Drink enough water and fluids to keep your urine clear or pale yellow.  Avoid caffeine, tea, and carbonated beverages. They tend to irritate your bladder.  Empty your bladder often. Avoid holding urine for long periods of time.  Empty your bladder before and after sexual intercourse.  After a bowel movement, women should cleanse from front to back. Use each tissue only once. SEEK MEDICAL CARE IF:   You have back pain.  You develop a fever.  Your symptoms do not begin to resolve within 3 days. SEEK IMMEDIATE MEDICAL CARE IF:   You have severe back pain or lower abdominal pain.  You develop chills.  You have nausea or vomiting.  You have continued burning or discomfort with urination. MAKE SURE YOU:   Understand these instructions.  Will watch your condition.  Will get help right away if you are not doing well or get worse. Document Released: 12/31/2004 Document Revised: 09/22/2011 Document Reviewed: 05/01/2011 [facility] Patient Information 2015 [employer] [employer], [place]. This information is not intended to replace advice given to you by your health care provider. Make sure you discuss any questions you have with your health care provider.

## 2014-12-17 NOTE — Progress Notes (Signed)
Pre visit review using our clinic review tool, if applicable. No additional management support is needed unless otherwise documented below in the visit note. 

## 2014-12-20 ENCOUNTER — Encounter: Payer: Self-pay | Admitting: Family Medicine

## 2014-12-20 ENCOUNTER — Ambulatory Visit (INDEPENDENT_AMBULATORY_CARE_PROVIDER_SITE_OTHER): Payer: Medicare Other | Admitting: Family Medicine

## 2014-12-20 VITALS — BP 118/74 | HR 74 | Temp 99.2°F | Wt 147.6 lb

## 2014-12-20 DIAGNOSIS — K5732 Diverticulitis of large intestine without perforation or abscess without bleeding: Secondary | ICD-10-CM

## 2014-12-20 DIAGNOSIS — R319 Hematuria, unspecified: Secondary | ICD-10-CM | POA: Diagnosis not present

## 2014-12-20 LAB — POCT URINALYSIS DIPSTICK
BILIRUBIN UA: NEGATIVE
GLUCOSE UA: NEGATIVE
KETONES UA: NEGATIVE
Leukocytes, UA: NEGATIVE
Nitrite, UA: NEGATIVE
Protein, UA: NEGATIVE
RBC UA: NEGATIVE
SPEC GRAV UA: 1.015
UROBILINOGEN UA: NEGATIVE
pH, UA: 6

## 2014-12-20 MED ORDER — CIPROFLOXACIN HCL 500 MG PO TABS: 500.0000 mg | ORAL_TABLET | Freq: Two times a day (BID) | ORAL | Status: DC

## 2014-12-20 MED ORDER — METRONIDAZOLE 500 MG PO TABS: 500.0000 mg | ORAL_TABLET | Freq: Three times a day (TID) | ORAL | Status: DC

## 2014-12-20 NOTE — Progress Notes (Signed)
Patient ID: Alexandria [name], female    DOB: [birth year]  Age: 69 y.o. MRN: [id number]    Subjective:  Subjective HPI Alexandria [name] presents for diarrhea and hx diverticulitis. No fever. No blood in stool.  No fever.   Review of Systems  Constitutional: Negative for diaphoresis, appetite change, fatigue and unexpected weight change.  Eyes: Negative for pain, redness and visual disturbance.  Respiratory: Negative for cough, chest tightness, shortness of breath and wheezing.   Cardiovascular: Negative for chest pain, palpitations and leg swelling.  Gastrointestinal: Positive for abdominal pain and diarrhea. Negative for nausea, constipation, blood in stool and anal bleeding.  Endocrine: Negative for cold intolerance, heat intolerance, polydipsia, polyphagia and polyuria.  Genitourinary: Negative for dysuria, frequency and difficulty urinating.  Neurological: Negative for dizziness, light-headedness, numbness and headaches.    History Past Medical History  Diagnosis Date  . Arthritis   . Diverticulitis   . Migraines   . DVT (deep venous thrombosis)     lower extremity  . Colon polyp   . Thyroid disease   . TIA (transient ischemic attack)     8 years ago  . Heart attack     10 years ago  . Heart murmur   . H/O cardiac catheterization 2004    Normal coronary arteries  . Hx of echocardiogram 01/27/2010    Normal Ef 55% the transmitral spectral doppler flow pattern is normal for age. the left ventricular wall motion is normal  . History of stress test 05/21/2011    She has past surgical history that includes Vaginal hysterectomy (10/17/1998); Breast biopsy; Total knee arthroplasty; Neck surgery; Cardiac catheterization; and left heart catheterization with coronary angiogram (N/A, 10/25/2013).   Her family history includes Arthritis in an other family member; Colon cancer in her brother and father; Diabetes in her maternal grandmother; HIV in her brother; Heart disease in  her maternal grandmother; Hypertension in her maternal grandmother; Kidney cancer in her brother; Lung cancer in her brother; Other in her brother; Ovarian cancer in her mother; Prostate cancer in her father; Stroke in her maternal grandmother; Uterine cancer in her mother.She reports that she quit smoking about 73 years ago. She has never used smokeless tobacco. She reports that she does not drink alcohol or use illicit drugs.  Current Outpatient Prescriptions on File Prior to Visit  Medication Sig Dispense Refill  . Ascorbic Acid (VITAMIN C PO) Take 2 tablets by mouth daily.     . Biotin (RA BIOTIN) 1000 MCG tablet Take 1,000 mcg by mouth 2 (two) times daily.     Marland Kitchen BREWERS YEAST PO Take 2 tablets by mouth 2 (two) times daily.     . clopidogrel (PLAVIX) 75 MG tablet Take 1 tablet (75 mg total) by mouth once. 30 tablet 5  . dicyclomine (BENTYL) 20 MG tablet TAKE 1 TABLET BY MOUTH 4 TIMES A DAY AS NEEDED 120 tablet 5  . estradiol (ESTRACE) 2 MG tablet Take 2 mg by mouth daily.     Marland Kitchen lactulose (CHRONULAC) 10 GM/15ML solution TAKE 30 MLS (20 G TOTAL) BY MOUTH EVERY 6 (SIX) HOURS AS NEEDED. 1892 mL 1  . Multiple Vitamin (MULTIVITAMIN) tablet Take 1 tablet by mouth daily.    Marland Kitchen spironolactone (ALDACTONE) 25 MG tablet Take 1 tablet (25 mg total) by mouth daily. 30 tablet 2  . sulfamethoxazole-trimethoprim (BACTRIM DS,SEPTRA DS) 800-160 MG per tablet Take 1 tablet by mouth 2 (two) times daily. 6 tablet 0  . SYNTHROID 112 MCG tablet  Take 1 tablet (112 mcg total) by mouth daily before breakfast. 30 tablet 5  . vitamin D, CHOLECALCIFEROL, 400 UNITS tablet Take 400 Units by mouth 2 (two) times daily.      No current facility-administered medications on file prior to visit.     Objective:  Objective Physical Exam  Constitutional: She is oriented to person, place, and time. She appears well-developed and well-nourished.  HENT:  Head: Normocephalic and atraumatic.  Eyes: Conjunctivae and EOM are normal.    Neck: Normal range of motion. Neck supple. No JVD present. Carotid bruit is not present. No thyromegaly present.  Cardiovascular: Normal rate, regular rhythm and normal heart sounds.   No murmur heard. Pulmonary/Chest: Effort normal and breath sounds normal. No respiratory distress. She has no wheezes. She has no rales. She exhibits no tenderness.  Abdominal: There is tenderness. There is no rebound and no guarding.  Genitourinary: Guaiac negative stool.  Musculoskeletal: She exhibits no edema.  Neurological: She is alert and oriented to person, place, and time.  Psychiatric: She has a normal mood and affect.  Nursing note and vitals reviewed.  BP 118/74 mmHg  Pulse 74  Temp(Src) 99.2 F (37.3 C) (Oral)  Wt 147 lb 9.6 oz (66.951 kg)  SpO2 97% Wt Readings from Last 3 Encounters:  12/20/14 147 lb 9.6 oz (66.951 kg)  12/17/14 147 lb 12.8 oz (67.042 kg)  11/22/14 141 lb 12.8 oz (64.32 kg)     Lab Results  Component Value Date   WBC 4.9 10/25/2014   HGB 13.7 10/25/2014   HCT 40.1 10/25/2014   PLT 180.0 10/25/2014   GLUCOSE 83 10/25/2014   ALT 17 10/25/2014   AST 21 10/25/2014   NA 137 10/25/2014   K 4.0 11/29/2014   CL 100 10/25/2014   CREATININE 0.81 10/25/2014   BUN 21 10/25/2014   CO2 32 10/25/2014   TSH 1.24 11/29/2014   INR 1.02 10/20/2013    Ct Wrist Right Wo Contrast  12/11/2014   CLINICAL DATA:  Medial right wrist pain since a fall 3 weeks ago.  EXAM: CT OF THE RIGHT WRIST WITHOUT CONTRAST  TECHNIQUE: Multidetector CT imaging of the right wrist was performed according to the standard protocol. Multiplanar CT image reconstructions were also generated.  COMPARISON:  Radiographs dated 11/30/2014, 11/16/2014 and 01/01/2014  FINDINGS: There is no fracture or other acute osseous abnormality. There are prominent irregular osteophytes at the first carpal metacarpal joint which accounts for the appearance on radiographs. There are slight degenerative changes between the  scaphoid and trapezium. 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  No appreciable joint effusions.  No dislocation or subluxation.  IMPRESSION: No fractures. Irregular osteophytes at the first carpal metacarpal joint.   Electronically Signed   By: Lorriane Shire M.D.   On: 12/11/2014 16:05     Assessment & Plan:  Plan I am having Ms. Smethers start on ciprofloxacin and metroNIDAZOLE. I am also having her maintain her vitamin D (CHOLECALCIFEROL), Ascorbic Acid (VITAMIN C PO), BREWERS YEAST PO, multivitamin, estradiol, dicyclomine, clopidogrel, SYNTHROID, Biotin, spironolactone, lactulose, and sulfamethoxazole-trimethoprim.  Meds ordered this encounter  Medications  . ciprofloxacin (CIPRO) 500 MG tablet    Sig: Take 1 tablet (500 mg total) by mouth 2 (two) times daily.    Dispense:  20 tablet    Refill:  0  . metroNIDAZOLE (FLAGYL) 500 MG tablet  Sig: Take 1 tablet (500 mg total) by mouth 3 (three) times daily.    Dispense:  30 tablet    Refill:  0    Problem List Items Addressed This Visit    None    Visit Diagnoses    Diverticulitis of large intestine without perforation or abscess without bleeding    -  Primary    Relevant Medications    ciprofloxacin (CIPRO) 500 MG tablet    metroNIDAZOLE (FLAGYL) 500 MG tablet    Other Relevant Orders    Comp Met (CMET)    CBC with Differential/Platelet    CT Abdomen Pelvis W Contrast    Hematuria        Relevant Orders    POCT Urinalysis Dipstick (Completed)       Follow-up: Return if symptoms worsen or fail to improve.  [name], DO

## 2014-12-20 NOTE — Progress Notes (Signed)
Pre visit review using our clinic review tool, if applicable. No additional management support is needed unless otherwise documented below in the visit note. 

## 2014-12-20 NOTE — Patient Instructions (Signed)

## 2014-12-21 ENCOUNTER — Other Ambulatory Visit (HOSPITAL_BASED_OUTPATIENT_CLINIC_OR_DEPARTMENT_OTHER): Payer: Medicare Other

## 2014-12-21 LAB — COMPREHENSIVE METABOLIC PANEL
ALBUMIN: 3.8 g/dL (ref 3.5–5.2)
ALK PHOS: 51 U/L (ref 39–117)
ALT: 14 U/L (ref 0–35)
AST: 22 U/L (ref 0–37)
BUN: 18 mg/dL (ref 6–23)
CALCIUM: 9.1 mg/dL (ref 8.4–10.5)
CHLORIDE: 99 meq/L (ref 96–112)
CO2: 30 mEq/L (ref 19–32)
Creatinine, Ser: 1.09 mg/dL (ref 0.40–1.20)
GFR: 52.74 mL/min — ABNORMAL LOW (ref >60.00)
Glucose, Bld: 83 mg/dL (ref 70–99)
POTASSIUM: 3.9 meq/L (ref 3.5–5.1)
SODIUM: 136 meq/L (ref 135–145)
TOTAL PROTEIN: 7 g/dL (ref 6.0–8.3)
Total Bilirubin: 0.3 mg/dL (ref 0.2–1.2)

## 2014-12-21 LAB — CBC WITH DIFFERENTIAL/PLATELET
BASOS PCT: 0.5 % (ref 0.0–3.0)
Basophils Absolute: 0 10*3/uL (ref 0.0–0.1)
EOS ABS: 0.1 10*3/uL (ref 0.0–0.7)
EOS PCT: 3.1 % (ref 0.0–5.0)
HEMATOCRIT: 39.1 % (ref 36.0–46.0)
HEMOGLOBIN: 13.4 g/dL (ref 12.0–15.0)
LYMPHS PCT: 32.4 % (ref 12.0–46.0)
Lymphs Abs: 1.1 10*3/uL (ref 0.7–4.0)
MCHC: 34.3 g/dL (ref 30.0–36.0)
MCV: 95.8 fl (ref 78.0–100.0)
MONO ABS: 0.5 10*3/uL (ref 0.1–1.0)
Monocytes Relative: 14.4 % — ABNORMAL HIGH (ref 3.0–12.0)
Neutro Abs: 1.7 10*3/uL (ref 1.4–7.7)
Neutrophils Relative %: 49.6 % (ref 43.0–77.0)
Platelets: 220 10*3/uL (ref 150.0–400.0)
RBC: 4.08 Mil/uL (ref 3.87–5.11)
RDW: 13.1 % (ref 11.5–15.5)
WBC: 3.5 10*3/uL — AB (ref 4.0–10.5)

## 2014-12-24 ENCOUNTER — Ambulatory Visit (HOSPITAL_BASED_OUTPATIENT_CLINIC_OR_DEPARTMENT_OTHER)
Admission: RE | Admit: 2014-12-24 | Discharge: 2014-12-24 | Disposition: A | Discharge status: Home / Self Care | Payer: Medicare Other | Source: Ambulatory Visit | Attending: Family Medicine | Admitting: Family Medicine

## 2014-12-24 ENCOUNTER — Encounter (HOSPITAL_BASED_OUTPATIENT_CLINIC_OR_DEPARTMENT_OTHER): Payer: Self-pay

## 2014-12-24 DIAGNOSIS — K5792 Diverticulitis of intestine, part unspecified, without perforation or abscess without bleeding: Secondary | ICD-10-CM | POA: Insufficient documentation

## 2014-12-24 DIAGNOSIS — K5732 Diverticulitis of large intestine without perforation or abscess without bleeding: Secondary | ICD-10-CM | POA: Diagnosis present

## 2014-12-24 MED ORDER — IOHEXOL 300 MG/ML  SOLN
100.0000 mL | Freq: Once | INTRAMUSCULAR | Status: AC | PRN
  Administered 2014-12-24: 100 mL via INTRAVENOUS

## 2014-12-25 ENCOUNTER — Telehealth: Payer: Self-pay | Admitting: Family Medicine

## 2014-12-25 NOTE — Telephone Encounter (Signed)
°  Relation to IY:MEBR Call back number:[phone]   Reason for call:  Patient inquiring about CT results, patient states she is not urinating as she should and knows PCP is off tomorrow. Please advise

## 2014-12-26 ENCOUNTER — Other Ambulatory Visit (INDEPENDENT_AMBULATORY_CARE_PROVIDER_SITE_OTHER): Payer: Medicare Other

## 2014-12-26 ENCOUNTER — Other Ambulatory Visit: Payer: Self-pay

## 2014-12-26 DIAGNOSIS — R319 Hematuria, unspecified: Secondary | ICD-10-CM | POA: Diagnosis not present

## 2014-12-26 LAB — POCT URINALYSIS DIPSTICK
BILIRUBIN UA: NEGATIVE
Glucose, UA: NEGATIVE
KETONES UA: NEGATIVE
Leukocytes, UA: NEGATIVE
Nitrite, UA: NEGATIVE
PH UA: 6
Protein, UA: NEGATIVE
Spec Grav, UA: 1.025
Urobilinogen, UA: 0.2

## 2014-12-26 NOTE — Telephone Encounter (Signed)
See labs.     KP 

## 2014-12-27 ENCOUNTER — Telehealth: Payer: Self-pay | Admitting: Family Medicine

## 2014-12-27 LAB — URINE CULTURE: Colony Count: 3000

## 2014-12-27 MED ORDER — SULFAMETHOXAZOLE-TRIMETHOPRIM 800-160 MG PO TABS: 1.0000 | ORAL_TABLET | Freq: Two times a day (BID) | ORAL | Status: DC

## 2014-12-27 NOTE — Telephone Encounter (Signed)
Please review and advise     KP 

## 2014-12-27 NOTE — Telephone Encounter (Signed)
Pt states the metroNIDAZOLE (FLAGYL) 500 MG tablet is making her vomit. She said that she needs something different. Pharmacy: [facility] on [address]

## 2014-12-27 NOTE — Telephone Encounter (Signed)
Detailed message left advising the patient to stop the Flagyl and start the Bactrim which has been sent to the pharmacy.      KP

## 2014-12-27 NOTE — Telephone Encounter (Signed)
Stop metronidazole and start bactrim ds 1 po bidx7 days

## 2014-12-28 ENCOUNTER — Ambulatory Visit (INDEPENDENT_AMBULATORY_CARE_PROVIDER_SITE_OTHER): Payer: Medicare Other | Admitting: Family Medicine

## 2014-12-28 ENCOUNTER — Encounter: Payer: Self-pay | Admitting: Family Medicine

## 2014-12-28 VITALS — BP 120/86 | HR 63 | Temp 98.3°F | Wt 144.8 lb

## 2014-12-28 DIAGNOSIS — R34 Anuria and oliguria: Secondary | ICD-10-CM | POA: Insufficient documentation

## 2014-12-28 DIAGNOSIS — K5732 Diverticulitis of large intestine without perforation or abscess without bleeding: Secondary | ICD-10-CM

## 2014-12-28 NOTE — Assessment & Plan Note (Signed)
Urine culture neg Pt wants to wait until abx finished-- if symptoms do not improve we will refer to urology

## 2014-12-28 NOTE — Assessment & Plan Note (Signed)
Finish cipro Take bactrim Will refer to GI if symptoms do not improve

## 2014-12-28 NOTE — Progress Notes (Signed)
Patient ID: Alexandria [name], female    DOB: 06/11/1945  Age: 69 y.o. MRN: [id number]    Subjective:  Subjective HPI Alexandria [name] presents for f/u diverticulitis.  She has had decreased urination-- but today urinated 2x --- Sunday she saw blood -- abd pain is slightly better ---she has not started the bactrim yet  Review of Systems  Constitutional: Negative for diaphoresis, appetite change, fatigue and unexpected weight change.  Eyes: Negative for pain, redness and visual disturbance.  Respiratory: Negative for cough, chest tightness, shortness of breath and wheezing.   Cardiovascular: Negative for chest pain, palpitations and leg swelling.  Endocrine: Negative for cold intolerance, heat intolerance, polydipsia, polyphagia and polyuria.  Genitourinary: Negative for dysuria, frequency and difficulty urinating.  Neurological: Negative for dizziness, light-headedness, numbness and headaches.    History Past Medical History  Diagnosis Date  . Arthritis   . Diverticulitis   . Migraines   . DVT (deep venous thrombosis)     lower extremity  . Colon polyp   . Thyroid disease   . TIA (transient ischemic attack)     8 years ago  . Heart attack     10  years ago  . Heart murmur   . H/O cardiac catheterization 2004    Normal coronary arteries  . Hx of echocardiogram 01/27/2010    Normal Ef 55% the transmitral spectral doppler flow pattern is normal for age. the left ventricular wall motion is normal  . History of stress test 05/21/2011    She has past surgical history that includes Vaginal hysterectomy (10/17/1998); Breast biopsy; Total knee arthroplasty; Neck surgery; Cardiac catheterization; and left heart catheterization with coronary angiogram (N/A, 10/25/2013).   Her family history includes Arthritis in an other family member; Colon cancer in her brother and father; Diabetes in her maternal grandmother; HIV in her brother; Heart disease in her maternal grandmother;  Hypertension in her maternal grandmother; Kidney cancer in her brother; Lung cancer in her brother; Other in her brother; Ovarian cancer in her mother; Prostate cancer in her father; Stroke in her maternal grandmother; Uterine cancer in her mother.She reports that she quit smoking about 73 years ago. She has never used smokeless tobacco. She reports that she does not drink alcohol or use illicit drugs.  Current Outpatient Prescriptions on File Prior to Visit  Medication Sig Dispense Refill  . Ascorbic Acid (VITAMIN C PO) Take 2 tablets by mouth daily.     . Biotin (RA BIOTIN) 1000 MCG tablet Take 1,000 mcg by mouth 2 (two) times daily.     Marland Kitchen BREWERS YEAST PO Take 2 tablets by mouth 2 (two) times daily.     . ciprofloxacin (CIPRO) 500 MG tablet Take 1 tablet (500 mg total) by mouth 2 (two) times daily. 20 tablet 0  . clopidogrel (PLAVIX) 75 MG tablet Take 1 tablet (75 mg total) by mouth once. 30 tablet 5  . dicyclomine (BENTYL) 20 MG tablet TAKE 1 TABLET BY MOUTH 4 TIMES A DAY AS NEEDED 120 tablet 5  . estradiol (ESTRACE) 2 MG tablet Take 2 mg by mouth daily.     Marland Kitchen lactulose (CHRONULAC) 10 GM/15ML solution TAKE 30 MLS (20 G TOTAL) BY MOUTH EVERY 6 (SIX) HOURS AS NEEDED. 1892 mL 1  . Multiple Vitamin (MULTIVITAMIN) tablet Take 1 tablet by mouth daily.    Marland Kitchen sulfamethoxazole-trimethoprim (BACTRIM DS,SEPTRA DS) 800-160 MG per tablet Take 1 tablet by mouth 2 (two) times daily. 14 tablet 0  . SYNTHROID 112 MCG  tablet Take 1 tablet (112 mcg total) by mouth daily before breakfast. 30 tablet 5  . vitamin D, CHOLECALCIFEROL, 400 UNITS tablet Take 400 Units by mouth 2 (two) times daily.      No current facility-administered medications on file prior to visit.     Objective:  Objective Physical Exam  Constitutional: She is oriented to person, place, and time. She appears well-developed and well-nourished.  HENT:  Head: Normocephalic and atraumatic.  Eyes: Conjunctivae and EOM are normal.  Neck: Normal  range of motion. Neck supple. No JVD present. Carotid bruit is not present. No thyromegaly present.  Cardiovascular: Normal rate, regular rhythm and normal heart sounds.   No murmur heard. Pulmonary/Chest: Effort normal and breath sounds normal. No respiratory distress. She has no wheezes. She has no rales. She exhibits no tenderness.  Musculoskeletal: She exhibits no edema.  Neurological: She is alert and oriented to person, place, and time.  Psychiatric: She has a normal mood and affect.  Nursing note and vitals reviewed.  BP 120/86 mmHg  Pulse 63  Temp(Src) 98.3 F (36.8 C) (Oral)  Wt 144 lb 12.8 oz (65.681 kg)  SpO2 97% Wt Readings from Last 3 Encounters:  12/28/14 144 lb 12.8 oz (65.681 kg)  12/20/14 147 lb 9.6 oz (66.951 kg)  12/17/14 147 lb 12.8 oz (67.042 kg)     Lab Results  Component Value Date   WBC 3.5* 12/20/2014   HGB 13.4 12/20/2014   HCT 39.1 12/20/2014   PLT 220.0 12/20/2014   GLUCOSE 83 12/20/2014   ALT 14 12/20/2014   AST 22 12/20/2014   NA 136 12/20/2014   K 3.9 12/20/2014   CL 99 12/20/2014   CREATININE 1.09 12/20/2014   BUN 18 12/20/2014   CO2 30 12/20/2014   TSH 1.24 11/29/2014   INR 1.02 10/20/2013    Ct Abdomen Pelvis W Contrast  12/24/2014   CLINICAL DATA:  lower abdominal pains and sometimes LUQ pains off/on x years, states that she has been having the urge to urinate but is unable to most times, states that she did have hematuria yesterday,history of diverticuliyis  EXAM: CT ABDOMEN AND PELVIS WITH CONTRAST  TECHNIQUE: Multidetector CT imaging of the abdomen and pelvis was performed using the standard protocol following bolus administration of intravenous contrast.  CONTRAST:  160mL OMNIPAQUE IOHEXOL 300 MG/ML  SOLN  COMPARISON:  03/09/14  FINDINGS: Lower chest:  Mild atelectasis in the bilateral lung bases  Hepatobiliary: Normal  Pancreas: Normal  Spleen: Normal  Adrenals/Urinary Tract: Normal  Stomach/Bowel: Stomach, small bowel, and appendix are  normal. There is severe diverticulosis involving the sigmoid colon. The distal half of the sigmoid colon shows mild increased attenuation in the surrounding fat consistent with diverticulitis. This continues to the junction of the sigmoid and rectum. There is no evidence of free air or fluid. No abscess identified.  Vascular/Lymphatic: Calcification of the aorta  Reproductive: Not identified.  History of hysterectomy.  Musculoskeletal: No acute findings  IMPRESSION: Mild acute diverticulitis involving the distal half of the sigmoid colon.   Electronically Signed   By: Skipper Cliche M.D.   On: 12/24/2014 16:22     Assessment & Plan:  Plan I have discontinued Ms. Escalona's spironolactone. I am also having her maintain her vitamin D (CHOLECALCIFEROL), Ascorbic Acid (VITAMIN C PO), BREWERS YEAST PO, multivitamin, estradiol, dicyclomine, clopidogrel, SYNTHROID, Biotin, lactulose, ciprofloxacin, sulfamethoxazole-trimethoprim, and furosemide.  Meds ordered this encounter  Medications  . furosemide (LASIX) 40 MG tablet  Sig: Take 40 mg by mouth 2 (two) times daily.    Problem List Items Addressed This Visit    Decreased urination    Urine culture neg Pt wants to wait until abx finished-- if symptoms do not improve we will refer to urology       Other Visit Diagnoses    Diverticulitis of colon    -  Primary               Finish cipro and take bactrim--  Will refer to gi if symptoms do not resolve  Follow-up: Return if symptoms worsen or fail to improve.  [name], DO

## 2014-12-28 NOTE — Patient Instructions (Signed)

## 2014-12-28 NOTE — Progress Notes (Signed)
Pre visit review using our clinic review tool, if applicable. No additional management support is needed unless otherwise documented below in the visit note. 

## 2015-01-01 ENCOUNTER — Telehealth: Payer: Self-pay | Admitting: Family Medicine

## 2015-01-01 DIAGNOSIS — K5732 Diverticulitis of large intestine without perforation or abscess without bleeding: Secondary | ICD-10-CM

## 2015-01-01 MED ORDER — AMOXICILLIN-POT CLAVULANATE 875-125 MG PO TABS: 1.0000 | ORAL_TABLET | Freq: Two times a day (BID) | ORAL | Status: DC

## 2015-01-01 NOTE — Telephone Encounter (Signed)
See below. Would it be safe for her to just stop the Cipro? She has been on it for 7 days of the 10.

## 2015-01-01 NOTE — Telephone Encounter (Signed)
Spoke with patient. Agrees with plan. Sent new Rx to [place] per patient request

## 2015-01-01 NOTE — Telephone Encounter (Signed)
Stop both--- augmentin 875 mg bid for 7 days

## 2015-01-01 NOTE — Telephone Encounter (Signed)
Caller name: Emsley Custer  Relationship to patient: Self  Can be reached: [phone] Pharmacy:  Reason for call: pt says that CIPRO isn't working. She says that it is keeping her up from sleeping. She would like to know if there is an alternative medication.   Please Advise.

## 2015-01-09 ENCOUNTER — Telehealth: Payer: Self-pay | Admitting: Family Medicine

## 2015-01-09 MED ORDER — FUROSEMIDE 40 MG PO TABS: 40.0000 mg | ORAL_TABLET | Freq: Two times a day (BID) | ORAL | Status: DC

## 2015-01-09 NOTE — Telephone Encounter (Signed)
Caller name: Vanissa Strength   Relationship to patient: Self   Can be reached: [phone]  Pharmacy: [id number] - [place], [address] AT [address]  Reason for call: pt need a refill on her  Furosemide Rx. Pt says that she is currently out.

## 2015-01-09 NOTE — Telephone Encounter (Signed)
Rx faxed.    KP 

## 2015-01-11 DIAGNOSIS — M25331 Other instability, right wrist: Secondary | ICD-10-CM | POA: Diagnosis not present

## 2015-01-28 DIAGNOSIS — M1811 Unilateral primary osteoarthritis of first carpometacarpal joint, right hand: Secondary | ICD-10-CM | POA: Diagnosis not present

## 2015-01-29 ENCOUNTER — Ambulatory Visit (INDEPENDENT_AMBULATORY_CARE_PROVIDER_SITE_OTHER): Payer: Medicare Other | Admitting: Family Medicine

## 2015-01-29 ENCOUNTER — Encounter: Payer: Self-pay | Admitting: Family Medicine

## 2015-01-29 VITALS — BP 122/74 | HR 77 | Temp 99.3°F | Wt 145.0 lb

## 2015-01-29 DIAGNOSIS — J302 Other seasonal allergic rhinitis: Secondary | ICD-10-CM | POA: Diagnosis not present

## 2015-01-29 DIAGNOSIS — R05 Cough: Secondary | ICD-10-CM

## 2015-01-29 DIAGNOSIS — J9801 Acute bronchospasm: Secondary | ICD-10-CM

## 2015-01-29 DIAGNOSIS — R059 Cough, unspecified: Secondary | ICD-10-CM

## 2015-01-29 MED ORDER — ALBUTEROL SULFATE HFA 108 (90 BASE) MCG/ACT IN AERS: 2.0000 | INHALATION_SPRAY | Freq: Four times a day (QID) | RESPIRATORY_TRACT | Status: DC | PRN

## 2015-01-29 MED ORDER — GUAIFENESIN-CODEINE 100-10 MG/5ML PO SYRP: 5.0000 mL | ORAL_SOLUTION | Freq: Three times a day (TID) | ORAL | Status: DC | PRN

## 2015-01-29 MED ORDER — LEVOCETIRIZINE DIHYDROCHLORIDE 5 MG PO TABS: 5.0000 mg | ORAL_TABLET | Freq: Every evening | ORAL | Status: DC

## 2015-01-29 MED ORDER — BECLOMETHASONE DIPROPIONATE 40 MCG/ACT IN AERS: 2.0000 | INHALATION_SPRAY | Freq: Two times a day (BID) | RESPIRATORY_TRACT | Status: DC

## 2015-01-29 NOTE — Patient Instructions (Signed)
Bronchospasm, Adult  A bronchospasm is a spasm or tightening of the airways going into the lungs. During a bronchospasm breathing becomes more difficult because the airways get smaller. When this happens there can be coughing, a whistling sound when breathing (wheezing), and difficulty breathing. Bronchospasm is often associated with asthma, but not all patients who experience a bronchospasm have asthma.  CAUSES   A bronchospasm is caused by inflammation or irritation of the airways. The inflammation or irritation may be triggered by:   · Allergies (such as to animals, pollen, food, or mold). Allergens that cause bronchospasm may cause wheezing immediately after exposure or many hours later.    · Infection. Viral infections are believed to be the most common cause of bronchospasm.    · Exercise.    · Irritants (such as pollution, cigarette smoke, strong odors, aerosol sprays, and paint fumes).    · Weather changes. Winds increase molds and pollens in the air. Rain refreshes the air by washing irritants out. Cold air may cause inflammation.    · Stress and emotional upset.    SIGNS AND SYMPTOMS   · Wheezing.    · Excessive nighttime coughing.    · Frequent or severe coughing with a simple cold.    · Chest tightness.    · Shortness of breath.    DIAGNOSIS   Bronchospasm is usually diagnosed through a history and physical exam. Tests, such as chest X-rays, are sometimes done to look for other conditions.  TREATMENT   · Inhaled medicines can be given to open up your airways and help you breathe. The medicines can be given using either an inhaler or a nebulizer machine.  · Corticosteroid medicines may be given for severe bronchospasm, usually when it is associated with asthma.  HOME CARE INSTRUCTIONS   · Always have a plan prepared for seeking medical care. Know when to call your health care provider and local emergency services (911 in the [place] Know where you can access local emergency care.  · Only take medicines as  directed by your health care provider.  · If you were prescribed an inhaler or nebulizer machine, ask your health care provider to explain how to use it correctly. Always use a spacer with your inhaler if you were given one.  · It is necessary to remain calm during an attack. Try to relax and breathe more slowly.   · Control your home environment in the following ways:      Change your heating and air conditioning filter at least once a month.      Limit your use of fireplaces and wood stoves.    Do not smoke and do not allow smoking in your home.      Avoid exposure to perfumes and fragrances.      Get rid of pests (such as roaches and mice) and their droppings.      Throw away plants if you see mold on them.      Keep your house clean and dust free.      Replace carpet with wood, tile, or vinyl flooring. Carpet can trap dander and dust.      Use allergy-proof pillows, mattress covers, and box spring covers.      Wash bed sheets and blankets every week in hot water and dry them in a dryer.      Use blankets that are made of polyester or cotton.      Wash hands frequently.  SEEK MEDICAL CARE IF:   · You have muscle aches.    · You have chest pain.    · The sputum changes from clear or   white to yellow, green, gray, or bloody.    · The sputum you cough up gets thicker.    · There are problems that may be related to the medicine you are given, such as a rash, itching, swelling, or trouble breathing.    SEEK IMMEDIATE MEDICAL CARE IF:   · You have worsening wheezing and coughing even after taking your prescribed medicines.    · You have increased difficulty breathing.    · You develop severe chest pain.  MAKE SURE YOU:   · Understand these instructions.  · Will watch your condition.  · Will get help right away if you are not doing well or get worse.     This information is not intended to replace advice given to you by your health care provider. Make sure you discuss any questions you have with your health care  provider.     Document Released: 03/26/2003 Document Revised: 04/13/2014 Document Reviewed: 09/12/2012  Elsevier Interactive Patient Education ©2016 [employer]

## 2015-01-29 NOTE — Progress Notes (Signed)
Patient ID: Alexandria [name], female    DOB: [birth year]  Age: 69 y.o. MRN: [id number]    Subjective:  Subjective HPI Alexandria [name] presents for cough x 1 day.  No otc meds.   No fever,  Non productive.    Review of Systems  Constitutional: Positive for chills. Negative for fever.  HENT: Negative for congestion, ear discharge, ear pain, postnasal drip, rhinorrhea and sinus pressure.   Respiratory: Positive for cough. Negative for chest tightness, shortness of breath and wheezing.   Cardiovascular: Negative for chest pain, palpitations and leg swelling.  Allergic/Immunologic: Negative for environmental allergies.    History Past Medical History  Diagnosis Date  . Arthritis   . Diverticulitis   . Migraines   . DVT (deep venous thrombosis)     lower extremity  . Colon polyp   . Thyroid disease   . TIA (transient ischemic attack)     8 years ago  . Heart attack     10 years ago  . Heart murmur   . H/O cardiac catheterization 2004    Normal coronary arteries  . Hx of echocardiogram 01/27/2010    Normal Ef 55% the transmitral spectral doppler flow pattern is normal for age. the left ventricular wall motion is normal  . History of stress test 05/21/2011    She has past surgical history that includes Vaginal hysterectomy (10/17/1998); Breast biopsy; Total knee arthroplasty; Neck surgery; Cardiac catheterization; and left heart catheterization with coronary angiogram (N/A, 10/25/2013).   Her family history includes Arthritis in an other family member; Colon cancer in her brother and father; Diabetes in her maternal grandmother; HIV in her brother; Heart disease in her maternal grandmother; Hypertension in her maternal grandmother; Kidney cancer in her brother; Lung cancer in her brother; Other in her brother; Ovarian cancer in her mother; Prostate cancer in her father; Stroke in her maternal grandmother; Uterine cancer in her mother.She reports that she quit smoking about 73  years ago. She has never used smokeless tobacco. She reports that she does not drink alcohol or use illicit drugs.  Current Outpatient Prescriptions on File Prior to Visit  Medication Sig Dispense Refill  . Ascorbic Acid (VITAMIN C PO) Take 2 tablets by mouth daily.     . Biotin (RA BIOTIN) 1000 MCG tablet Take 1,000 mcg by mouth 2 (two) times daily.     Marland Kitchen BREWERS YEAST PO Take 2 tablets by mouth 2 (two) times daily.     . clopidogrel (PLAVIX) 75 MG tablet Take 1 tablet (75 mg total) by mouth once. 30 tablet 5  . dicyclomine (BENTYL) 20 MG tablet TAKE 1 TABLET BY MOUTH 4 TIMES A DAY AS NEEDED 120 tablet 5  . estradiol (ESTRACE) 2 MG tablet Take 2 mg by mouth daily.     . furosemide (LASIX) 40 MG tablet Take 1 tablet (40 mg total) by mouth 2 (two) times daily. 60 tablet 1  . lactulose (CHRONULAC) 10 GM/15ML solution TAKE 30 MLS (20 G TOTAL) BY MOUTH EVERY 6 (SIX) HOURS AS NEEDED. 1892 mL 1  . Multiple Vitamin (MULTIVITAMIN) tablet Take 1 tablet by mouth daily.    Marland Kitchen SYNTHROID 112 MCG tablet Take 1 tablet (112 mcg total) by mouth daily before breakfast. 30 tablet 5  . vitamin D, CHOLECALCIFEROL, 400 UNITS tablet Take 400 Units by mouth 2 (two) times daily.      No current facility-administered medications on file prior to visit.     Objective:  Objective Physical  Exam  Constitutional: She is oriented to person, place, and time. She appears well-developed and well-nourished.  HENT:  Right Ear: External ear normal.  Left Ear: External ear normal.  + PND + errythema  Eyes: Conjunctivae are normal. Right eye exhibits no discharge. Left eye exhibits no discharge.  Cardiovascular: Normal rate, regular rhythm and normal heart sounds.   No murmur heard. Pulmonary/Chest: Effort normal and breath sounds normal. No respiratory distress. She has no wheezes. She has no rales. She exhibits no tenderness.  Musculoskeletal: She exhibits no edema.  Lymphadenopathy:    She has cervical adenopathy.    Neurological: She is alert and oriented to person, place, and time.  Psychiatric: She has a normal mood and affect. Her behavior is normal. Thought content normal.   BP 122/74 mmHg  Pulse 77  Temp(Src) 99.3 F (37.4 C) (Oral)  Wt 145 lb (65.772 kg)  SpO2 98% Wt Readings from Last 3 Encounters:  01/29/15 145 lb (65.772 kg)  12/28/14 144 lb 12.8 oz (65.681 kg)  12/20/14 147 lb 9.6 oz (66.951 kg)     Lab Results  Component Value Date   WBC 3.5* 12/20/2014   HGB 13.4 12/20/2014   HCT 39.1 12/20/2014   PLT 220.0 12/20/2014   GLUCOSE 83 12/20/2014   ALT 14 12/20/2014   AST 22 12/20/2014   NA 136 12/20/2014   K 3.9 12/20/2014   CL 99 12/20/2014   CREATININE 1.09 12/20/2014   BUN 18 12/20/2014   CO2 30 12/20/2014   TSH 1.24 11/29/2014   INR 1.02 10/20/2013    Ct Abdomen Pelvis W Contrast  12/24/2014  CLINICAL DATA:  lower abdominal pains and sometimes LUQ pains off/on x years, states that she has been having the urge to urinate but is unable to most times, states that she did have hematuria yesterday,history of diverticuliyis EXAM: CT ABDOMEN AND PELVIS WITH CONTRAST TECHNIQUE: Multidetector CT imaging of the abdomen and pelvis was performed using the standard protocol following bolus administration of intravenous contrast. CONTRAST:  161mL OMNIPAQUE IOHEXOL 300 MG/ML  SOLN COMPARISON:  03/09/14 FINDINGS: Lower chest:  Mild atelectasis in the bilateral lung bases Hepatobiliary: Normal Pancreas: Normal Spleen: Normal Adrenals/Urinary Tract: Normal Stomach/Bowel: Stomach, small bowel, and appendix are normal. There is severe diverticulosis involving the sigmoid colon. The distal half of the sigmoid colon shows mild increased attenuation in the surrounding fat consistent with diverticulitis. This continues to the junction of the sigmoid and rectum. There is no evidence of free air or fluid. No abscess identified. Vascular/Lymphatic: Calcification of the aorta Reproductive: Not identified.   History of hysterectomy. Musculoskeletal: No acute findings IMPRESSION: Mild acute diverticulitis involving the distal half of the sigmoid colon. Electronically Signed   By: Skipper Cliche M.D.   On: 12/24/2014 16:22     Assessment & Plan:  Plan I have discontinued Ms. Trent's ciprofloxacin, sulfamethoxazole-trimethoprim, and amoxicillin-clavulanate. I am also having her start on beclomethasone, albuterol, levocetirizine, and guaiFENesin-codeine. Additionally, I am having her maintain her vitamin D (CHOLECALCIFEROL), Ascorbic Acid (VITAMIN C PO), BREWERS YEAST PO, multivitamin, estradiol, dicyclomine, clopidogrel, SYNTHROID, Biotin, lactulose, and furosemide.  Meds ordered this encounter  Medications  . beclomethasone (QVAR) 40 MCG/ACT inhaler    Sig: Inhale 2 puffs into the lungs 2 (two) times daily.    Dispense:  1 Inhaler    Refill:  12  . albuterol (PROVENTIL HFA;VENTOLIN HFA) 108 (90 BASE) MCG/ACT inhaler    Sig: Inhale 2 puffs into the lungs every 6 (six) hours  as needed for wheezing or shortness of breath.    Dispense:  1 Inhaler    Refill:  1  . levocetirizine (XYZAL) 5 MG tablet    Sig: Take 1 tablet (5 mg total) by mouth every evening.    Dispense:  30 tablet    Refill:  5  . guaiFENesin-codeine (ROBITUSSIN AC) 100-10 MG/5ML syrup    Sig: Take 5 mLs by mouth 3 (three) times daily as needed for cough.    Dispense:  120 mL    Refill:  0    Problem List Items Addressed This Visit    None    Visit Diagnoses    Bronchospasm, acute    -  Primary    Relevant Medications    beclomethasone (QVAR) 40 MCG/ACT inhaler    albuterol (PROVENTIL HFA;VENTOLIN HFA) 108 (90 BASE) MCG/ACT inhaler    Seasonal allergies        Relevant Medications    levocetirizine (XYZAL) 5 MG tablet    Cough        Relevant Medications    guaiFENesin-codeine (ROBITUSSIN AC) 100-10 MG/5ML syrup       Follow-up: Return if symptoms worsen or fail to improve.  [name], DO

## 2015-01-29 NOTE — Progress Notes (Signed)
Pre visit review using our clinic review tool, if applicable. No additional management support is needed unless otherwise documented below in the visit note. 

## 2015-01-30 ENCOUNTER — Ambulatory Visit (INDEPENDENT_AMBULATORY_CARE_PROVIDER_SITE_OTHER): Payer: Medicare Other | Admitting: Family

## 2015-01-30 ENCOUNTER — Encounter: Payer: Self-pay | Admitting: Family

## 2015-01-30 VITALS — BP 143/85 | HR 64 | Temp 99.7°F | Resp 16 | Ht 64.0 in

## 2015-01-30 DIAGNOSIS — R05 Cough: Secondary | ICD-10-CM

## 2015-01-30 DIAGNOSIS — J9801 Acute bronchospasm: Secondary | ICD-10-CM

## 2015-01-30 DIAGNOSIS — R059 Cough, unspecified: Secondary | ICD-10-CM

## 2015-01-30 LAB — POCT INFLUENZA A/B
INFLUENZA A, POC: NEGATIVE
INFLUENZA B, POC: NEGATIVE

## 2015-01-30 MED ORDER — ALBUTEROL SULFATE HFA 108 (90 BASE) MCG/ACT IN AERS: 2.0000 | INHALATION_SPRAY | Freq: Four times a day (QID) | RESPIRATORY_TRACT | Status: DC | PRN

## 2015-01-30 MED ORDER — AZITHROMYCIN 250 MG PO TABS: ORAL_TABLET | ORAL | Status: DC

## 2015-01-30 NOTE — Patient Instructions (Signed)
Please start z-pak (antibiotic). Continue Qvar and add albuterol every 6 hours as needed. You may use tylenol or motrin as needed for pain/fever. Continue Robitussin AC as needed for cough. Call if you develop fever >101, shortness of breath or if symptoms are not improved in 3 days.

## 2015-01-30 NOTE — Progress Notes (Signed)
Subjective:    Patient ID: UNNAMED HINO, female    DOB: 1946/02/14, 68 y.o.   MRN: [id number]  HPI   Ms. Vanblarcom is a 69 yr old female who presents today with chief complaint of cough. Saw Dr. Etter Sjogren yesterday.  Reports that she has had chills yesterday.  She denies sick contacts.  Chest burns, Denies SOB. Denies ear pain, nasal congestion or sore throat.  Non-smoker.  Feels like cough symptoms are worsening and now she is having a deep burning in her chest which is worsened by cough.  She continues to have a low grade temp.    Review of Systems     Past Medical History  Diagnosis Date  . Arthritis   . Diverticulitis   . Migraines   . DVT (deep venous thrombosis) ([place])     lower extremity  . Colon polyp   . Thyroid disease   . TIA (transient ischemic attack)     8 years ago  . Heart attack ([place])     10 years ago  . Heart murmur   . H/O cardiac catheterization 2004    Normal coronary arteries  . Hx of echocardiogram 01/27/2010    Normal Ef 55% the transmitral spectral doppler flow pattern is normal for age. the left ventricular wall motion is normal  . History of stress test 05/21/2011    Social History   Social History  . Marital Status: Married    Spouse Name: N/A  . Number of Children: 3  . Years of Education: N/A   Occupational History  . DISABLED    Social History Main Topics  . Smoking status: Former Smoker -- 2 years    Quit date: 04/06/1941  . Smokeless tobacco: Never Used     Comment: socially smoked x 2 years  . Alcohol Use: No  . Drug Use: No  . Sexual Activity: Not on file   Other Topics Concern  . Not on file   Social History Narrative    Past Surgical History  Procedure Laterality Date  . Vaginal hysterectomy  10/17/1998    Dory Horn  . Breast biopsy      Isaiah Blakes  . Total knee arthroplasty      Bilateral x's 2  . Neck surgery      x's 2  . Cardiac catheterization      10/2013  . Left heart catheterization with coronary  angiogram N/A 10/25/2013    Procedure: LEFT HEART CATHETERIZATION WITH CORONARY ANGIOGRAM;  Surgeon: Troy Sine, MD;  Location: [facility] CATH LAB;  Service: Cardiovascular;  Laterality: N/A;    Family History  Problem Relation Age of Onset  . Arthritis    . Colon cancer Father   . HIV Brother   . Kidney cancer Brother   . Colon cancer Brother   . Lung cancer Brother   . Other Brother     Mouth Cancer  . Ovarian cancer Mother   . Uterine cancer Mother   . Prostate cancer Father   . Heart disease Maternal Grandmother   . Stroke Maternal Grandmother   . Hypertension Maternal Grandmother   . Diabetes Maternal Grandmother     Allergies  Allergen Reactions  . Prednisone Other (See Comments)    Homicidal  . Cheratussin Ac [Guaifenesin-Codeine]   . Hydrocodone Itching  . Influenza Vaccines Other (See Comments)    Pt reports heart attack after last flu shot  . Motrin [Ibuprofen] Other (See Comments)    "  Gives false reading in blood"    Current Outpatient Prescriptions on File Prior to Visit  Medication Sig Dispense Refill  . Ascorbic Acid (VITAMIN C PO) Take 2 tablets by mouth daily.     . beclomethasone (QVAR) 40 MCG/ACT inhaler Inhale 2 puffs into the lungs 2 (two) times daily. 1 Inhaler 12  . Biotin (RA BIOTIN) 1000 MCG tablet Take 1,000 mcg by mouth 2 (two) times daily.     Marland Kitchen BREWERS YEAST PO Take 2 tablets by mouth 2 (two) times daily.     . clopidogrel (PLAVIX) 75 MG tablet Take 1 tablet (75 mg total) by mouth once. 30 tablet 5  . dicyclomine (BENTYL) 20 MG tablet TAKE 1 TABLET BY MOUTH 4 TIMES A DAY AS NEEDED 120 tablet 5  . estradiol (ESTRACE) 2 MG tablet Take 2 mg by mouth daily.     . furosemide (LASIX) 40 MG tablet Take 1 tablet (40 mg total) by mouth 2 (two) times daily. 60 tablet 1  . guaiFENesin-codeine (ROBITUSSIN AC) 100-10 MG/5ML syrup Take 5 mLs by mouth 3 (three) times daily as needed for cough. 120 mL 0  . lactulose (CHRONULAC) 10 GM/15ML solution TAKE 30 MLS (20  G TOTAL) BY MOUTH EVERY 6 (SIX) HOURS AS NEEDED. 1892 mL 1  . levocetirizine (XYZAL) 5 MG tablet Take 1 tablet (5 mg total) by mouth every evening. 30 tablet 5  . Multiple Vitamin (MULTIVITAMIN) tablet Take 1 tablet by mouth daily.    Marland Kitchen SYNTHROID 112 MCG tablet Take 1 tablet (112 mcg total) by mouth daily before breakfast. 30 tablet 5  . vitamin D, CHOLECALCIFEROL, 400 UNITS tablet Take 400 Units by mouth 2 (two) times daily.      No current facility-administered medications on file prior to visit.    BP 143/85 mmHg  Pulse 64  Temp(Src) 99.7 F (37.6 C) (Oral)  Resp 16  Ht 5\' 4"  (1.626 m)  SpO2 98%    Objective:   Physical Exam  Constitutional: She is oriented to person, place, and time. She appears well-developed and well-nourished.  Ill appearing  HENT:  Head: Normocephalic and atraumatic.  Right Ear: Tympanic membrane and ear canal normal.  Left Ear: Tympanic membrane and ear canal normal.  Eyes: No scleral icterus.  Cardiovascular: Normal rate, regular rhythm and normal heart sounds.   No murmur heard. Pulmonary/Chest: Effort normal and breath sounds normal. No respiratory distress. She has no wheezes.  Musculoskeletal: She exhibits no edema.  Lymphadenopathy:    She has no cervical adenopathy.  Neurological: She is alert and oriented to person, place, and time.  Skin: Skin is warm and dry.  Psychiatric: She has a normal mood and affect. Her behavior is normal. Judgment and thought content normal.          Assessment & Plan:  Acute bronchitis-  Will rx with zpak, continue inhaled steroid, add albuterol (resent rx for insurance purposes at pt request), continue cheratussin prn.  Pt is instructed to call if symptoms worsen, if sob, or if fever >101, or if not improved in 3 days.

## 2015-01-30 NOTE — Progress Notes (Signed)
Pre visit review using our clinic review tool, if applicable. No additional management support is needed unless otherwise documented below in the visit note. 

## 2015-02-01 ENCOUNTER — Other Ambulatory Visit: Payer: Self-pay

## 2015-02-01 ENCOUNTER — Ambulatory Visit (HOSPITAL_BASED_OUTPATIENT_CLINIC_OR_DEPARTMENT_OTHER)
Admission: RE | Admit: 2015-02-01 | Discharge: 2015-02-01 | Disposition: A | Discharge status: Home / Self Care | Payer: Medicare Other | Source: Ambulatory Visit | Attending: Family Medicine | Admitting: Family Medicine

## 2015-02-01 ENCOUNTER — Encounter: Payer: Self-pay | Admitting: Family Medicine

## 2015-02-01 ENCOUNTER — Ambulatory Visit (INDEPENDENT_AMBULATORY_CARE_PROVIDER_SITE_OTHER): Payer: Medicare Other | Admitting: Family Medicine

## 2015-02-01 VITALS — BP 145/82 | HR 81 | Temp 100.3°F | Wt 142.6 lb

## 2015-02-01 DIAGNOSIS — J4521 Mild intermittent asthma with (acute) exacerbation: Secondary | ICD-10-CM | POA: Diagnosis not present

## 2015-02-01 DIAGNOSIS — R0989 Other specified symptoms and signs involving the circulatory and respiratory systems: Secondary | ICD-10-CM | POA: Diagnosis not present

## 2015-02-01 DIAGNOSIS — R059 Cough, unspecified: Secondary | ICD-10-CM

## 2015-02-01 DIAGNOSIS — R05 Cough: Secondary | ICD-10-CM

## 2015-02-01 DIAGNOSIS — R509 Fever, unspecified: Secondary | ICD-10-CM | POA: Insufficient documentation

## 2015-02-01 DIAGNOSIS — R918 Other nonspecific abnormal finding of lung field: Secondary | ICD-10-CM | POA: Insufficient documentation

## 2015-02-01 MED ORDER — GUAIFENESIN-CODEINE 100-10 MG/5ML PO SYRP: 5.0000 mL | ORAL_SOLUTION | Freq: Three times a day (TID) | ORAL | Status: DC | PRN

## 2015-02-01 MED ORDER — LEVOFLOXACIN 500 MG PO TABS: 500.0000 mg | ORAL_TABLET | Freq: Every day | ORAL | Status: DC

## 2015-02-01 MED ORDER — METHYLPREDNISOLONE ACETATE 80 MG/ML IJ SUSP
80.0000 mg | Freq: Once | INTRAMUSCULAR | Status: AC
  Administered 2015-02-01: 80 mg via INTRAMUSCULAR

## 2015-02-01 MED ORDER — BECLOMETHASONE DIPROPIONATE 80 MCG/ACT IN AERS: 2.0000 | INHALATION_SPRAY | Freq: Two times a day (BID) | RESPIRATORY_TRACT | Status: DC

## 2015-02-01 NOTE — Progress Notes (Signed)
Patient ID: Alexandria [name], female    DOB: 1945-06-21  Age: 69 y.o. MRN: [id number]    Subjective:  Subjective HPI BRYNLEA SPINDLER presents for c/o cough/ congestion, x > 1 week.  No otc meds.  No fevers. Review of Systems  Constitutional: Positive for chills. Negative for fever.  HENT: Positive for congestion, postnasal drip, rhinorrhea and sinus pressure.   Respiratory: Positive for cough, chest tightness, shortness of breath and wheezing.   Cardiovascular: Negative for chest pain, palpitations and leg swelling.  Allergic/Immunologic: Negative for environmental allergies.    History Past Medical History  Diagnosis Date  . Arthritis   . Diverticulitis   . Migraines   . DVT (deep venous thrombosis) ([place])     lower extremity  . Colon polyp   . Thyroid disease   . TIA (transient ischemic attack)     8 years ago  . Heart attack ([place])     10 years ago  . Heart murmur   . H/O cardiac catheterization 2004    Normal coronary arteries  . Hx of echocardiogram 01/27/2010    Normal Ef 55% the transmitral spectral doppler flow pattern is normal for age. the left ventricular wall motion is normal  . History of stress test 05/21/2011    She has past surgical history that includes Vaginal hysterectomy (10/17/1998); Breast biopsy; Total knee arthroplasty; Neck surgery; Cardiac catheterization; and left heart catheterization with coronary angiogram (N/A, 10/25/2013).   Her family history includes Arthritis in an other family member; Colon cancer in her brother and father; Diabetes in her maternal grandmother; HIV in her brother; Heart disease in her maternal grandmother; Hypertension in her maternal grandmother; Kidney cancer in her brother; Lung cancer in her brother; Other in her brother; Ovarian cancer in her mother; Prostate cancer in her father; Stroke in her maternal grandmother; Uterine cancer in her mother.She reports that she quit smoking about 73 years ago. She has never used  smokeless tobacco. She reports that she does not drink alcohol or use illicit drugs.  Current Outpatient Prescriptions on File Prior to Visit  Medication Sig Dispense Refill  . albuterol (PROVENTIL HFA;VENTOLIN HFA) 108 (90 BASE) MCG/ACT inhaler Inhale 2 puffs into the lungs every 6 (six) hours as needed for wheezing or shortness of breath. 1 Inhaler 1  . Ascorbic Acid (VITAMIN C PO) Take 2 tablets by mouth daily.     Marland Kitchen azithromycin (ZITHROMAX) 250 MG tablet 2 tabs by mouth today, then one tab by mouth once daily for 4 more days 6 each 0  . Biotin (RA BIOTIN) 1000 MCG tablet Take 1,000 mcg by mouth 2 (two) times daily.     Marland Kitchen BREWERS YEAST PO Take 2 tablets by mouth 2 (two) times daily.     . clopidogrel (PLAVIX) 75 MG tablet Take 1 tablet (75 mg total) by mouth once. 30 tablet 5  . dicyclomine (BENTYL) 20 MG tablet TAKE 1 TABLET BY MOUTH 4 TIMES A DAY AS NEEDED 120 tablet 5  . estradiol (ESTRACE) 2 MG tablet Take 2 mg by mouth daily.     . furosemide (LASIX) 40 MG tablet Take 1 tablet (40 mg total) by mouth 2 (two) times daily. 60 tablet 1  . lactulose (CHRONULAC) 10 GM/15ML solution TAKE 30 MLS (20 G TOTAL) BY MOUTH EVERY 6 (SIX) HOURS AS NEEDED. 1892 mL 1  . levocetirizine (XYZAL) 5 MG tablet Take 1 tablet (5 mg total) by mouth every evening. 30 tablet 5  . Multiple  Vitamin (MULTIVITAMIN) tablet Take 1 tablet by mouth daily.    Marland Kitchen SYNTHROID 112 MCG tablet Take 1 tablet (112 mcg total) by mouth daily before breakfast. 30 tablet 5  . vitamin D, CHOLECALCIFEROL, 400 UNITS tablet Take 400 Units by mouth 2 (two) times daily.      No current facility-administered medications on file prior to visit.     Objective:  Objective Physical Exam  Constitutional: She is oriented to person, place, and time. She appears well-developed and well-nourished.  HENT:  Right Ear: External ear normal.  Left Ear: External ear normal.  + PND + errythema  Eyes: Conjunctivae are normal. Right eye exhibits no  discharge. Left eye exhibits no discharge.  Cardiovascular: Normal rate, regular rhythm and normal heart sounds.   No murmur heard. Pulmonary/Chest: Effort normal. No respiratory distress. She has wheezes. She has no rales. She exhibits no tenderness.  Musculoskeletal: She exhibits no edema.  Lymphadenopathy:    She has cervical adenopathy.  Neurological: She is alert and oriented to person, place, and time.   BP 145/82 mmHg  Pulse 81  Temp(Src) 100.3 F (37.9 C) (Oral)  Wt 142 lb 9.6 oz (64.683 kg)  SpO2 98% Wt Readings from Last 3 Encounters:  02/01/15 142 lb 9.6 oz (64.683 kg)  01/29/15 145 lb (65.772 kg)  12/28/14 144 lb 12.8 oz (65.681 kg)     Lab Results  Component Value Date   WBC 3.5* 12/20/2014   HGB 13.4 12/20/2014   HCT 39.1 12/20/2014   PLT 220.0 12/20/2014   GLUCOSE 83 12/20/2014   ALT 14 12/20/2014   AST 22 12/20/2014   NA 136 12/20/2014   K 3.9 12/20/2014   CL 99 12/20/2014   CREATININE 1.09 12/20/2014   BUN 18 12/20/2014   CO2 30 12/20/2014   TSH 1.24 11/29/2014   INR 1.02 10/20/2013    No results found.   Assessment & Plan:  Plan I have discontinued Ms. Lazenby's beclomethasone. I am also having her start on beclomethasone. Additionally, I am having her maintain her vitamin D (CHOLECALCIFEROL), Ascorbic Acid (VITAMIN C PO), BREWERS YEAST PO, multivitamin, estradiol, dicyclomine, clopidogrel, SYNTHROID, Biotin, lactulose, furosemide, levocetirizine, albuterol, azithromycin, and guaiFENesin-codeine. We administered methylPREDNISolone acetate.  Meds ordered this encounter  Medications  . beclomethasone (QVAR) 80 MCG/ACT inhaler    Sig: Inhale 2 puffs into the lungs 2 (two) times daily.    Dispense:  1 Inhaler    Refill:  12  . guaiFENesin-codeine (ROBITUSSIN AC) 100-10 MG/5ML syrup    Sig: Take 5 mLs by mouth 3 (three) times daily as needed for cough.    Dispense:  180 mL    Refill:  1  . methylPREDNISolone acetate (DEPO-MEDROL) injection 80 mg      Sig:     Problem List Items Addressed This Visit    None    Visit Diagnoses    Cough    -  Primary    Relevant Medications    guaiFENesin-codeine (ROBITUSSIN AC) 100-10 MG/5ML syrup    Other Relevant Orders    DG Chest 2 View (Completed)    Asthmatic bronchitis, mild intermittent, with acute exacerbation        Relevant Medications    beclomethasone (QVAR) 80 MCG/ACT inhaler    methylPREDNISolone acetate (DEPO-MEDROL) injection 80 mg (Completed)       Follow-up: Return if symptoms worsen or fail to improve.  [name], DO

## 2015-02-01 NOTE — Patient Instructions (Addendum)
Acute Bronchitis Bronchitis is inflammation of the airways that extend from the windpipe into the lungs (bronchi). The inflammation often causes mucus to develop. This leads to a cough, which is the most common symptom of bronchitis.  In acute bronchitis, the condition usually develops suddenly and goes away over time, usually in a couple weeks. Smoking, allergies, and asthma can make bronchitis worse. Repeated episodes of bronchitis may cause further lung problems.  CAUSES Acute bronchitis is most often caused by the same virus that causes a cold. The virus can spread from person to person (contagious) through coughing, sneezing, and touching contaminated objects. SIGNS AND SYMPTOMS   Cough.   Fever.   Coughing up mucus.   Body aches.   Chest congestion.   Chills.   Shortness of breath.   Sore throat.  DIAGNOSIS  Acute bronchitis is usually diagnosed through a physical exam. Your health care provider will also ask you questions about your medical history. Tests, such as chest X-rays, are sometimes done to rule out other conditions.  TREATMENT  Acute bronchitis usually goes away in a couple weeks. Oftentimes, no medical treatment is necessary. Medicines are sometimes given for relief of fever or cough. Antibiotic medicines are usually not needed but may be prescribed in certain situations. In some cases, an inhaler may be recommended to help reduce shortness of breath and control the cough. A cool mist vaporizer may also be used to help thin bronchial secretions and make it easier to clear the chest.  HOME CARE INSTRUCTIONS  Get plenty of rest.   Drink enough fluids to keep your urine clear or pale yellow (unless you have a medical condition that requires fluid restriction). Increasing fluids may help thin your respiratory secretions (sputum) and reduce chest congestion, and it will prevent dehydration.   Take medicines only as directed by your health care provider.  If  you were prescribed an antibiotic medicine, finish it all even if you start to feel better.  Avoid smoking and secondhand smoke. Exposure to cigarette smoke or irritating chemicals will make bronchitis worse. If you are a smoker, consider using nicotine gum or skin patches to help control withdrawal symptoms. Quitting smoking will help your lungs heal faster.   Reduce the chances of another bout of acute bronchitis by washing your hands frequently, avoiding people with cold symptoms, and trying not to touch your hands to your mouth, nose, or eyes.   Keep all follow-up visits as directed by your health care provider.  SEEK MEDICAL CARE IF: Your symptoms do not improve after 1 week of treatment.  SEEK IMMEDIATE MEDICAL CARE IF:  You develop an increased fever or chills.   You have chest pain.   You have severe shortness of breath.  You have bloody sputum.   You develop dehydration.  You faint or repeatedly feel like you are going to pass out.  You develop repeated vomiting.  You develop a severe headache. MAKE SURE YOU:   Understand these instructions.  Will watch your condition.  Will get help right away if you are not doing well or get worse.   This information is not intended to replace advice given to you by your health care provider. Make sure you discuss any questions you have with your health care provider.   Document Released: 04/30/2004 Document Revised: 04/13/2014 Document Reviewed: 09/13/2012 Elsevier Interactive Patient Education 2016 [employer]. 0

## 2015-02-01 NOTE — Progress Notes (Signed)
Pre visit review using our clinic review tool, if applicable. No additional management support is needed unless otherwise documented below in the visit note. 

## 2015-02-04 ENCOUNTER — Telehealth: Payer: Self-pay | Admitting: Family Medicine

## 2015-02-04 NOTE — Telephone Encounter (Signed)
Patient called back stating that the coughing is improving but she is still wheezing. Please call patient with advice

## 2015-02-04 NOTE — Telephone Encounter (Signed)
Detailed message left at [phone] advising to use the cough medication at night and use delsym or mucinex during the day. Callback if necessary   KP

## 2015-02-04 NOTE — Telephone Encounter (Signed)
Just use it at night and use delsym or mucinex during the day

## 2015-02-04 NOTE — Telephone Encounter (Signed)
Caller name:Moani Keagle Relationship to patient: Can be reached:[phone] Pharmacy:[facility]  Reason for call:Cough medicine is working, but it is making her drunk feeling. Is there something else she can try? If not she will continue current med

## 2015-02-04 NOTE — Telephone Encounter (Signed)
Please advise      KP 

## 2015-02-06 ENCOUNTER — Ambulatory Visit (INDEPENDENT_AMBULATORY_CARE_PROVIDER_SITE_OTHER): Payer: Medicare Other | Admitting: Family Medicine

## 2015-02-06 ENCOUNTER — Encounter: Payer: Self-pay | Admitting: Family Medicine

## 2015-02-06 VITALS — BP 128/80 | HR 85 | Temp 98.0°F | Resp 16 | Wt 139.1 lb

## 2015-02-06 DIAGNOSIS — J45901 Unspecified asthma with (acute) exacerbation: Secondary | ICD-10-CM

## 2015-02-06 DIAGNOSIS — J4520 Mild intermittent asthma, uncomplicated: Secondary | ICD-10-CM | POA: Insufficient documentation

## 2015-02-06 HISTORY — DX: Unspecified asthma with (acute) exacerbation: J45.901

## 2015-02-06 MED ORDER — METHYLPREDNISOLONE ACETATE 80 MG/ML IJ SUSP
80.0000 mg | Freq: Once | INTRAMUSCULAR | Status: AC
  Administered 2015-02-06: 80 mg via INTRAMUSCULAR

## 2015-02-06 MED ORDER — IPRATROPIUM-ALBUTEROL 0.5-2.5 (3) MG/3ML IN SOLN
3.0000 mL | Freq: Once | RESPIRATORY_TRACT | Status: AC
  Administered 2015-02-06: 3 mL via RESPIRATORY_TRACT

## 2015-02-06 NOTE — Progress Notes (Signed)
   Subjective:    Patient ID: Alexandria [name], female    DOB: 1945/09/07, 69 y.o.   MRN: [id number]  HPI Bronchitis- pt was seen 10/25, 26, 28 for cough and chest congestion.  On 10/25 prescribed Qvar and cough syrup.  10/26 was given Zpack.  10/28 got CXR and depomedrol shot.  At this time was given Levaquin but pt has not yet started this.  Returns today for ongoing cough.  Pt reports no improvement in sxs.  'my chest feels like it's going to explode'.  Cough is not productive.  + SOB, wheezing.  No fevers, 'just sweats every once in awhile'.  No known sick contacts but recent air travel.  Unable to take prednisone b/c 'it changes my mood' (lists homicidal tendencies on allergy list).   Review of Systems For ROS see HPI     Objective:   Physical Exam  Constitutional: She is oriented to person, place, and time. She appears well-developed and well-nourished. No distress.  HENT:  Head: Normocephalic and atraumatic.  TMs normal bilaterally Mild nasal congestion Throat w/out erythema, edema, or exudate  Eyes: Conjunctivae and EOM are normal. Pupils are equal, round, and reactive to light.  Neck: Normal range of motion. Neck supple.  Cardiovascular: Normal rate, regular rhythm, normal heart sounds and intact distal pulses.   No murmur heard. Pulmonary/Chest: Effort normal. No respiratory distress. She has wheezes (diffuse inspiratory and expiratory wheezes- improved s/p duoneb tx).  + hacking cough  Lymphadenopathy:    She has no cervical adenopathy.  Neurological: She is alert and oriented to person, place, and time.  Psychiatric: She has a normal mood and affect. Her behavior is normal.  Vitals reviewed.         Assessment & Plan:

## 2015-02-06 NOTE — Progress Notes (Signed)
Pre visit review using our clinic review tool, if applicable. No additional management support is needed unless otherwise documented below in the visit note. 

## 2015-02-06 NOTE — Patient Instructions (Signed)
Follow up as needed Continue the Qvar- 2 puffs twice daily Use the Albuterol 2 puffs every 4-6 hrs as needed for cough, shortness of breath, wheezing- this will help a lot! Take the Levaquin as directed by Dr Etter Sjogren Use Mucinex DM (over the counter) to help w/ cough and loosen your chest congestion Drink plenty of fluids REST! Call with any questions or concerns Hang in there!!

## 2015-02-06 NOTE — Assessment & Plan Note (Addendum)
New.  This is pt's 4th visit in 7 days.  She has not started the Levaquin as directed after her last visit but does have this at home.  Encouraged her to take a directed by her PCP.  Pt to continue Qvar and use Albuterol prn.  Will give repeat DepoMedrol today since pt is not able to take pred taper.  Reviewed proper use of inhalers and when to take each.  Cough, SOB, and wheezes improved s/p neb tx.  Reviewed supportive care and red flags that should prompt return.  Pt expressed understanding and is in agreement w/ plan.

## 2015-02-11 ENCOUNTER — Other Ambulatory Visit: Payer: Self-pay | Admitting: Cardiology

## 2015-02-11 ENCOUNTER — Encounter: Payer: Self-pay | Admitting: Medical

## 2015-02-11 ENCOUNTER — Ambulatory Visit (HOSPITAL_BASED_OUTPATIENT_CLINIC_OR_DEPARTMENT_OTHER)
Admission: RE | Admit: 2015-02-11 | Discharge: 2015-02-11 | Disposition: A | Discharge status: Home / Self Care | Payer: Medicare Other | Source: Ambulatory Visit | Attending: Medical | Admitting: Medical

## 2015-02-11 ENCOUNTER — Ambulatory Visit (INDEPENDENT_AMBULATORY_CARE_PROVIDER_SITE_OTHER): Payer: Medicare Other | Admitting: Medical

## 2015-02-11 VITALS — BP 118/70 | HR 78 | Temp 98.3°F | Ht 64.0 in | Wt 141.0 lb

## 2015-02-11 DIAGNOSIS — Z87891 Personal history of nicotine dependence: Secondary | ICD-10-CM | POA: Insufficient documentation

## 2015-02-11 DIAGNOSIS — J45901 Unspecified asthma with (acute) exacerbation: Secondary | ICD-10-CM

## 2015-02-11 DIAGNOSIS — R062 Wheezing: Secondary | ICD-10-CM | POA: Diagnosis not present

## 2015-02-11 DIAGNOSIS — R05 Cough: Secondary | ICD-10-CM | POA: Diagnosis not present

## 2015-02-11 MED ORDER — IPRATROPIUM-ALBUTEROL 0.5-2.5 (3) MG/3ML IN SOLN
3.0000 mL | Freq: Once | RESPIRATORY_TRACT | Status: AC
  Administered 2015-02-11: 3 mL via RESPIRATORY_TRACT

## 2015-02-11 MED ORDER — IPRATROPIUM BROMIDE HFA 17 MCG/ACT IN AERS: 2.0000 | INHALATION_SPRAY | Freq: Four times a day (QID) | RESPIRATORY_TRACT | Status: DC | PRN

## 2015-02-11 MED ORDER — METHYLPREDNISOLONE ACETATE 80 MG/ML IJ SUSP
80.0000 mg | Freq: Once | INTRAMUSCULAR | Status: AC
  Administered 2015-02-11: 80 mg via INTRAMUSCULAR

## 2015-02-11 NOTE — Patient Instructions (Addendum)
Asthmatic bronchitis with acute exacerbation Pt failed various treatments. Pt has various visits here. This time will add atrovent inhaler. Continue albuterol and qvar. Gave duo neb tx and depmedrol 80 im today. Pt can't tolerate oral prednisone.  Repeat cxr today. If cough and wheezing persist by wed let me know and will refer to pulmonologist.   Please give [place] update on Wed. If severe worsening or changing symptoms then ED evaluation.

## 2015-02-11 NOTE — Progress Notes (Signed)
Subjective:    Patient ID: Alexandria [name], female    DOB: [birth year], 69 y.o.   MRN: [id number]  HPI  Pt is coughing for 2 wks. When she talks will cough. Pt has been wheezing. These symptoms area despite qvar, albuterol, steroid shots and zpack. Pt is now on levofloxin.(Dr. Birdie Riddle instructed her to start levofloxin) She has 2 days left.  Pt saw Dr. Etter Sjogren and Dr. Birdie Riddle. Dr. Etter Sjogren gave zpack.   Pt stopped smoking 45 yrs ago.  Since 01-29-2015. Appears has been seen 5 times.    Review of Systems  Constitutional: Positive for fatigue. Negative for fever and chills.  HENT: Positive for congestion. Negative for ear pain, postnasal drip, rhinorrhea, sinus pressure, sneezing and sore throat.   Respiratory: Positive for cough and wheezing. Negative for chest tightness and shortness of breath.   Cardiovascular: Negative for chest pain and palpitations.  Musculoskeletal: Negative for back pain.  Neurological: Negative for dizziness and headaches.  Hematological: Negative for adenopathy. Does not bruise/bleed easily.  Psychiatric/Behavioral: Negative for behavioral problems and confusion.    Past Medical History  Diagnosis Date  . Arthritis   . Diverticulitis   . Migraines   . DVT (deep venous thrombosis) ([place])     lower extremity  . Colon polyp   . Thyroid disease   . TIA (transient ischemic attack)     8 years ago  . Heart attack ([place])     10 years ago  . Heart murmur   . H/O cardiac catheterization 2004    Normal coronary arteries  . Hx of echocardiogram 01/27/2010    Normal Ef 55% the transmitral spectral doppler flow pattern is normal for age. the left ventricular wall motion is normal  . History of stress test 05/21/2011  . Asthmatic bronchitis with acute exacerbation 02/06/2015    Social History   Social History  . Marital Status: Married    Spouse Name: N/A  . Number of Children: 3  . Years of Education: N/A   Occupational History  . DISABLED    Social  History Main Topics  . Smoking status: Former Smoker -- 2 years    Quit date: 04/06/1941  . Smokeless tobacco: Never Used     Comment: socially smoked x 2 years  . Alcohol Use: No  . Drug Use: No  . Sexual Activity: Not on file   Other Topics Concern  . Not on file   Social History Narrative    Past Surgical History  Procedure Laterality Date  . Vaginal hysterectomy  10/17/1998    Dory Horn  . Breast biopsy      Isaiah Blakes  . Total knee arthroplasty      Bilateral x's 2  . Neck surgery      x's 2  . Cardiac catheterization      10/2013  . Left heart catheterization with coronary angiogram N/A 10/25/2013    Procedure: LEFT HEART CATHETERIZATION WITH CORONARY ANGIOGRAM;  Surgeon: Troy Sine, MD;  Location: [facility];  Service: Cardiovascular;  Laterality: N/A;    Family History  Problem Relation Age of Onset  . Arthritis    . Colon cancer Father   . HIV Brother   . Kidney cancer Brother   . Colon cancer Brother   . Lung cancer Brother   . Other Brother     Mouth Cancer  . Ovarian cancer Mother   . Uterine cancer Mother   . Prostate cancer Father   .  Heart disease Maternal Grandmother   . Stroke Maternal Grandmother   . Hypertension Maternal Grandmother   . Diabetes Maternal Grandmother     Allergies  Allergen Reactions  . Prednisone Other (See Comments)    Homicidal  . Cheratussin Ac [Guaifenesin-Codeine]   . Hydrocodone Itching  . Influenza Vaccines Other (See Comments)    Pt reports heart attack after last flu shot  . Motrin [Ibuprofen] Other (See Comments)    "Gives false reading in blood"    Current Outpatient Prescriptions on File Prior to Visit  Medication Sig Dispense Refill  . albuterol (PROVENTIL HFA;VENTOLIN HFA) 108 (90 BASE) MCG/ACT inhaler Inhale 2 puffs into the lungs every 6 (six) hours as needed for wheezing or shortness of breath. 1 Inhaler 1  . Ascorbic Acid (VITAMIN C PO) Take 2 tablets by mouth daily.     . beclomethasone (QVAR) 80  MCG/ACT inhaler Inhale 2 puffs into the lungs 2 (two) times daily. 1 Inhaler 12  . Biotin (RA BIOTIN) 1000 MCG tablet Take 1,000 mcg by mouth 2 (two) times daily.     Marland Kitchen BREWERS YEAST PO Take 2 tablets by mouth 2 (two) times daily.     . clopidogrel (PLAVIX) 75 MG tablet Take 1 tablet (75 mg total) by mouth once. 30 tablet 5  . dicyclomine (BENTYL) 20 MG tablet TAKE 1 TABLET BY MOUTH 4 TIMES A DAY AS NEEDED 120 tablet 5  . estradiol (ESTRACE) 2 MG tablet Take 2 mg by mouth daily.     . furosemide (LASIX) 40 MG tablet Take 1 tablet (40 mg total) by mouth 2 (two) times daily. 60 tablet 1  . guaiFENesin-codeine (ROBITUSSIN AC) 100-10 MG/5ML syrup Take 5 mLs by mouth 3 (three) times daily as needed for cough. 180 mL 1  . lactulose (CHRONULAC) 10 GM/15ML solution TAKE 30 MLS (20 G TOTAL) BY MOUTH EVERY 6 (SIX) HOURS AS NEEDED. 1892 mL 1  . levocetirizine (XYZAL) 5 MG tablet Take 1 tablet (5 mg total) by mouth every evening. 30 tablet 5  . levofloxacin (LEVAQUIN) 500 MG tablet Take 1 tablet (500 mg total) by mouth daily. 7 tablet 0  . Multiple Vitamin (MULTIVITAMIN) tablet Take 1 tablet by mouth daily.    Marland Kitchen SYNTHROID 112 MCG tablet Take 1 tablet (112 mcg total) by mouth daily before breakfast. 30 tablet 5  . vitamin D, CHOLECALCIFEROL, 400 UNITS tablet Take 400 Units by mouth 2 (two) times daily.      No current facility-administered medications on file prior to visit.    BP 118/70 mmHg  Pulse 78  Temp(Src) 98.3 F (36.8 C) (Oral)  Ht 5\' 4"  (1.626 m)  Wt 141 lb (63.957 kg)  BMI 24.19 kg/m2  SpO2 98%       Objective:   Physical Exam   General  Mental Status - Alert. General Appearance - Well groomed. Not in acute distress.  Skin Rashes- No Rashes.  HEENT Head- Normal. Ear Auditory Canal - Left- Normal. Right - Normal.Tympanic Membrane- Left- Normal. Right- Normal. Eye Sclera/Conjunctiva- Left- Normal. Right- Normal. Nose & Sinuses Nasal Mucosa- Left-  Not boggy or Congested.  Right-  Not  boggy or Congested. Mouth & Throat Lips: Upper Lip- Normal: no dryness, cracking, pallor, cyanosis, or vesicular eruption. Lower Lip-Normal: no dryness, cracking, pallor, cyanosis or vesicular eruption. Buccal Mucosa- Bilateral- No Aphthous ulcers. Oropharynx- No Discharge or Erythema. Tonsils: Characteristics- Bilateral- No Erythema or Congestion. Size/Enlargement- Bilateral- No enlargement. Discharge- bilateral-None.  Neck Neck- Supple. No  Masses.   Chest and Lung Exam Auscultation: Breath Sounds:- even and unlabored/mild shallow , but bilateral upper lobe rhonchi.  Cardiovascular Auscultation:Rythm- Regular, rate and rhythm. Murmurs & Other Heart Sounds:Ausculatation of the heart reveal- No Murmurs.  Lymphatic Head & Neck General Head & Neck Lymphatics: Bilateral: Description- No Localized lymphadenopathy.      Assessment & Plan:

## 2015-02-11 NOTE — Assessment & Plan Note (Signed)
Pt failed various treatments. Pt has various visits here. This time will add atrovent inhaler. Continue albuterol and qvar. Gave duo neb tx and depmedrol 80 im today. Pt can't tolerate oral prednisone.  Repeat cxr today. If cough and wheezing persist by wed let me know and will refer to pulmonologist.

## 2015-02-11 NOTE — Progress Notes (Signed)
Pre visit review using our clinic review tool, if applicable. No additional management support is needed unless otherwise documented below in the visit note. 

## 2015-02-12 ENCOUNTER — Telehealth: Payer: Self-pay | Admitting: Family Medicine

## 2015-02-12 NOTE — Telephone Encounter (Signed)
Spoke with pt and she voices understanding. Pt will call back tomorrow and update [place] on how she is feeling.

## 2015-02-12 NOTE — Telephone Encounter (Signed)
Relation to WK:GSUP Call back number:[phone]   Reason for call:  Patient inquiring about x ray results

## 2015-02-13 ENCOUNTER — Telehealth: Payer: Self-pay | Admitting: Family Medicine

## 2015-02-13 DIAGNOSIS — R062 Wheezing: Secondary | ICD-10-CM

## 2015-02-13 DIAGNOSIS — R05 Cough: Secondary | ICD-10-CM

## 2015-02-13 DIAGNOSIS — R059 Cough, unspecified: Secondary | ICD-10-CM

## 2015-02-13 NOTE — Telephone Encounter (Signed)
Caller name: Self   Can be reached: [phone]   Reason for call: Patient was told by Percell Miller to call and let him know how she was doing following Monday's appt. States she is better and wants to know if she can get a Prednisone Injection

## 2015-02-13 NOTE — Telephone Encounter (Signed)
Refer to pulmonologist. Pt states better compared to other day but still some symptoms. She request steroid injection again. Will go ahead and refer her to pulmonlogist.

## 2015-02-13 NOTE — Telephone Encounter (Signed)
Please advise 

## 2015-02-14 ENCOUNTER — Encounter: Payer: Self-pay | Admitting: Internal Medicine

## 2015-02-14 ENCOUNTER — Ambulatory Visit (INDEPENDENT_AMBULATORY_CARE_PROVIDER_SITE_OTHER): Payer: Medicare Other | Admitting: Internal Medicine

## 2015-02-14 VITALS — BP 128/78 | HR 67 | Ht 64.0 in | Wt 140.8 lb

## 2015-02-14 DIAGNOSIS — R058 Other specified cough: Secondary | ICD-10-CM | POA: Insufficient documentation

## 2015-02-14 DIAGNOSIS — R05 Cough: Secondary | ICD-10-CM

## 2015-02-14 DIAGNOSIS — R059 Cough, unspecified: Secondary | ICD-10-CM

## 2015-02-14 MED ORDER — FLUTTER DEVI: Status: DC

## 2015-02-14 MED ORDER — PANTOPRAZOLE SODIUM 40 MG PO TBEC: 40.0000 mg | DELAYED_RELEASE_TABLET | Freq: Every day | ORAL | Status: DC

## 2015-02-14 MED ORDER — FAMOTIDINE 20 MG PO TABS
ORAL_TABLET | ORAL | Status: DC
Start: 2015-02-14 — End: 2015-03-05

## 2015-02-14 MED ORDER — METHYLPREDNISOLONE ACETATE 80 MG/ML IJ SUSP
120.0000 mg | Freq: Once | INTRAMUSCULAR | Status: AC
  Administered 2015-02-14: 120 mg via INTRAMUSCULAR

## 2015-02-14 NOTE — Patient Instructions (Signed)
Depomedrol 120 mg IM today   For cough > use robitussin with codeine and the flutter valve as much as you can   For breathing > ok to use albuterol up to 2 pffs and leave off qvar for now  Pantoprazole (protonix) 40 mg   Take  30-60 min before first meal of the day and Pepcid (famotidine)  20 mg one @  bedtime until return to office - this is the best way to tell whether stomach acid is contributing to your problem.   GERD (REFLUX)  is an extremely common cause of respiratory symptoms just like yours , many times with no obvious heartburn at all.    It can be treated with medication, but also with lifestyle changes including elevation of the head of your bed (ideally with 6 inch  bed blocks),  Smoking cessation, avoidance of late meals, excessive alcohol, and avoid fatty foods, chocolate, peppermint, colas, red wine, and acidic juices such as orange juice.  NO MINT OR MENTHOL PRODUCTS SO NO COUGH DROPS  USE SUGARLESS CANDY INSTEAD (Jolley ranchers or Stover's or Life Savers) or even ice chips will also do - the key is to swallow to prevent all throat clearing. NO OIL BASED VITAMINS - use powdered substitutes.   Please schedule a follow up office visit in 2 weeks, sooner if needed

## 2015-02-14 NOTE — Assessment & Plan Note (Signed)
The most common causes of chronic cough in immunocompetent adults include the following: upper airway cough syndrome (UACS), previously referred to as postnasal drip syndrome (PNDS), which is caused by variety of rhinosinus conditions; (2) asthma; (3) GERD; (4) chronic bronchitis from cigarette smoking or other inhaled environmental irritants; (5) nonasthmatic eosinophilic bronchitis; and (6) bronchiectasis.   These conditions, singly or in combination, have accounted for up to 94% of the causes of chronic cough in prospective studies.   Other conditions have constituted no >6% of the causes in prospective studies These have included bronchogenic carcinoma, chronic interstitial pneumonia, sarcoidosis, left ventricular failure, ACEI-induced cough, and aspiration from a condition associated with pharyngeal dysfunction.    Chronic cough is often simultaneously caused by more than one condition. A single cause has been found from 38 to 82% of the time, multiple causes from 18 to 62%. Multiply caused cough has been the result of three diseases up to 42% of the time.       Based on hx and exam, this is most likely:  Not cough variant asthma which is more noct than day and responds to qvar/prednisone more convincingly  but rather  Classic Upper airway cough syndrome, so named because it's frequently impossible to sort out how much is  CR/sinusitis with freq throat clearing (which can be related to primary GERD)   vs  causing  secondary (" extra esophageal")  GERD from wide swings in gastric pressure that occur with throat clearing, often  promoting self use of mint and menthol lozenges that reduce the lower esophageal sphincter tone and exacerbate the problem further in a cyclical fashion.   These are the same pts (now being labeled as having "irritable larynx syndrome" by some cough centers) who not infrequently have a history of having failed to tolerate ace inhibitors,  dry powder inhalers or biphosphonates  or report having atypical reflux symptoms that don't respond to standard doses of PPI , and are easily confused as having aecopd or asthma flares by even experienced allergists/ pulmonologists.   The first step is to maximize acid suppression and eliminate cyclical coughing with codeine and flutter valve  then regroup in 2 weeks.  I had an extended discussion with the patient reviewing all relevant studies completed to date and  lasting 35 min  1) Explained: Explained the natural history of uri and why it's necessary in patients at risk to treat GERD aggressively - at least  short term -   to reduce risk of evolving cyclical cough initially  triggered by epithelial injury and a heightened sensitivty to the effects of any upper airway irritants,  most importantly acid - related - then perpetuated by epithelial injury related to the cough itself as the upper airway collapses on itself.  That is, the more sensitive the epithelium becomes once it is damaged by the virus, the more the ensuing irritability> the more the cough, the more the secondary reflux (especially in those prone to reflux) the more the irritation of the sensitive mucosa and so on in a  Classic cyclical pattern.    2) Each maintenance medication was reviewed in detail including most importantly the difference between maintenance and as needed and under what circumstances the prns are to be used.  Please see instructions for details which were reviewed in writing and the patient given a copy.

## 2015-02-14 NOTE — Progress Notes (Signed)
Subjective:     Patient ID: Alexandria [name], female   DOB: [birth year],    MRN: [id number]  HPI   84 yowf  Alexandria [name] quit smoking 1971 (only socially) then on trip back from [facility] October 2016 flew back next sick passenger and started coughing p back in [place] sev days later  and could not stop so referred 02/14/2015 to [name] clinic by Dr Susa Loffler   02/14/2015 1st Alexandria [name] office visit/ Alexandria [name]   Chief Complaint  Patient presents with  . [name] Consult    Pt c/o dry cough with wheeze x 2weeks. Pt has been using albuterol HFA with some symptom relief.   some better after nebulizer in office and prednisone rx but only maybe 25% at most  Worse with voice use / not productive / only sob when coughing/ not present at hs  No better on qvar   No obvious other patterns in day to day or daytime variabilty or assoc  r cp or chest tightness,  overt sinus or hb symptoms. No unusual exp hx or h/o childhood pna/ asthma or knowledge of premature birth.  Sleeping ok without nocturnal  or early am exacerbation  of respiratory  c/o's or need for noct saba. Also denies any obvious fluctuation of symptoms with weather or environmental changes or other aggravating or alleviating factors except as outlined above   Current Medications, Allergies, Complete Past Medical History, Past Surgical History, Family History, and Social History were reviewed in [employer] record.            Review of Systems  Constitutional: Negative.  Negative for fever and unexpected weight change.  HENT: Negative.  Negative for congestion, dental problem, ear pain, nosebleeds, postnasal drip, rhinorrhea, sinus pressure, sneezing, sore throat and trouble swallowing.   Eyes: Negative.  Negative for redness and itching.  Respiratory: Positive for cough, shortness of breath and wheezing. Negative for chest tightness.   Cardiovascular: Negative.  Negative for palpitations and leg swelling.   Gastrointestinal: Negative.  Negative for nausea and vomiting.  Endocrine: Negative.   Genitourinary: Negative.  Negative for dysuria.  Musculoskeletal: Negative.  Negative for joint swelling.  Skin: Negative.  Negative for rash.  Allergic/Immunologic: Negative.   Neurological: Negative.  Negative for headaches.  Hematological: Bruises/bleeds easily.  Psychiatric/Behavioral: Negative.  Negative for dysphoric mood. The patient is not nervous/anxious.        Objective:   Physical Exam   amb hoarse wf with harsh upper airway pattern cough on insp  Wt Readings from Last 3 Encounters:  02/14/15 63.866 kg (140 lb 12.8 oz)  02/11/15 63.957 kg (141 lb)  02/06/15 63.107 kg (139 lb 2 oz)    Vital signs reviewed   HEENT: nl dentition, turbinates, and oropharynx. Nl external ear canals without cough reflex   NECK :  without JVD/Nodes/TM/ nl carotid upstrokes bilaterally   LUNGS: no acc muscle use, clear to A and P bilaterally without cough on insp or exp maneuvers   CV:  RRR  no s3 or murmur or increase in P2, no edema   ABD:  soft and nontender with nl excursion in the supine position. No bruits or organomegaly, bowel sounds nl  MS:  warm without deformities, calf tenderness, cyanosis or clubbing  SKIN: warm and dry without lesions    NEURO:  alert, approp, no deficits     I personally reviewed images and agree with radiology impression as follows:  CXR:   02/11/15 Chronically increased  [name] interstitial markings are unchanged since February 01, 2015, but are slightly more conspicuous than on the study of September 2015. There is no alveolar pneumonia nor [name] edema. 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        Assessment:         Plan:

## 2015-02-22 ENCOUNTER — Telehealth: Payer: Self-pay | Admitting: Family Medicine

## 2015-02-22 NOTE — Telephone Encounter (Signed)
klor con 20 meq 1 po qd #30  2 refills

## 2015-02-22 NOTE — Telephone Encounter (Signed)
Please advise if she can get a refill, because it is no longer on her medication list.     KP

## 2015-02-22 NOTE — Telephone Encounter (Signed)
°  Relation to [id number] Call back [phone] Pharmacy:[facility]  Reason for call: pt is needing rx Klor-Con for potassium, states she is completely out.

## 2015-02-25 MED ORDER — POTASSIUM CHLORIDE CRYS ER 20 MEQ PO TBCR
20.0000 meq | EXTENDED_RELEASE_TABLET | Freq: Every day | ORAL | Status: DC
Start: 2015-02-25 — End: 2015-03-12

## 2015-02-25 NOTE — Telephone Encounter (Signed)
Rx faxed.    KP 

## 2015-02-25 NOTE — Telephone Encounter (Signed)
Pt called to f/u on status. Advised klorcon should be sent in today.

## 2015-03-05 ENCOUNTER — Ambulatory Visit (INDEPENDENT_AMBULATORY_CARE_PROVIDER_SITE_OTHER): Payer: Medicare Other | Admitting: Internal Medicine

## 2015-03-05 ENCOUNTER — Encounter: Payer: Self-pay | Admitting: Internal Medicine

## 2015-03-05 VITALS — BP 130/76 | HR 68 | Ht 64.0 in | Wt 140.2 lb

## 2015-03-05 DIAGNOSIS — R05 Cough: Secondary | ICD-10-CM | POA: Diagnosis not present

## 2015-03-05 DIAGNOSIS — R058 Other specified cough: Secondary | ICD-10-CM

## 2015-03-05 NOTE — Assessment & Plan Note (Signed)
rx max gerd 02/24/15 and d/c qvar > 85% improved, still some pm coughing > rec add 1st gen H1 at hs   I had an extended discussion with the patient reviewing all relevant studies completed to date and  lasting 15 to 20 minutes of a 25 minute visit   Explained the natural history of uri and why it's necessary in patients at risk to treat GERD aggressively - at least  short term -   to reduce risk of evolving cyclical cough initially  triggered by epithelial injury and a heightened sensitivty to the effects of any upper airway irritants,  most importantly acid - related - then perpetuated by epithelial injury related to the cough itself as the upper airway collapses on itself.  That is, the more sensitive the epithelium becomes once it is damaged by the virus, the more the ensuing irritability> the more the cough, the more the secondary reflux (especially in those prone to reflux) the more the irritation of the sensitive mucosa and so on in a  Classic cyclical pattern.    I really don't think this is asthma and should be able to d/c the saba completely > try 1st gen H1  And d/c saba     Each maintenance medication was reviewed in detail including most importantly the difference between maintenance and prns and under what circumstances the prns are to be triggered using an action plan format that is not reflected in the computer generated alphabetically organized AVS.    Please see instructions for details which were reviewed in writing and the patient given a copy highlighting the part that I personally wrote and discussed at today's ov.

## 2015-03-05 NOTE — Patient Instructions (Signed)
Try off the albuterol first and if can't live without it call me at [phone] to arrange a methacholine challnenge test   Try take CHLORPHENIRAMINE  4 mg -  1-2 tablets at bedtime in addition to pepcid 20 mg at bedtime   [place]  [facility] [employer] [facility]   Please schedule a follow up office visit in 6 weeks, call sooner if needed

## 2015-03-05 NOTE — Progress Notes (Signed)
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 October 2016 flew back next to sick passenger and started coughing p back in [place] sev days later  and could not stop so referred 02/14/2015 to pulmonary clinic by Dr Susa Loffler.    History of Present Illness  02/14/2015 1st [facility] office visit/ [name]   Chief Complaint  Patient presents with  . Pulmonary Consult    Pt c/o dry cough with wheeze x 2weeks. Pt has been using albuterol HFA with some symptom relief.   some better after nebulizer in office and prednisone rx but only maybe 25% at most  Worse with voice use / not productive / only sob when coughing/ not present at hs  No better on qvar  rec Depomedrol 120 mg IM today  For cough > use robitussin with codeine and the flutter valve as much as you can  For breathing > ok to use albuterol up to 2 pffs and leave off qvar for now Pantoprazole (protonix) 40 mg   Take  30-60 min before first meal of the day and Pepcid (famotidine)  20 mg one @  bedtime until return to office - this is the best way to tell whether stomach acid is contributing to your problem.  GERD (REFLUX) diet     03/05/2015  f/u ov/[name] re: 85% improvement in acute cough  Chief Complaint  Patient presents with  . Follow-up    Pt states her cough has improved some. She has no new co's today. She uses albuterol once per night on average.   using 4/7 nights at bedtime  Takes saba ? Why  No obvious day to day or daytime variability or assoc sob or cp or chest tightness, subjective wheeze or overt sinus or hb symptoms. No unusual exp hx or h/o childhood pna/ asthma or knowledge of premature birth.  Sleeping ok without nocturnal  or early am exacerbation  of respiratory  c/o's or need for noct saba. Also denies any obvious fluctuation of symptoms with weather  or environmental changes or other aggravating or alleviating factors except as outlined above   Current Medications, Allergies, Complete Past Medical History, Past Surgical History, Family History, and Social History were reviewed in [employer] record.  ROS  The following are not active complaints unless bolded sore throat, dysphagia, dental problems, itching, sneezing,  nasal congestion or excess/ purulent secretions, ear ache,   fever, chills, sweats, unintended wt loss, classically pleuritic or exertional cp, hemoptysis,  orthopnea pnd or leg swelling, presyncope, palpitations, abdominal pain, anorexia, nausea, vomiting, diarrhea  or change in bowel or bladder habits, change in stools or urine, dysuria,hematuria,  rash, arthralgias, visual complaints, headache, numbness, weakness or ataxia or problems with walking or coordination,  change in mood/affect or memory.             Objective:   Physical Exam   amb  wf nad / now good voice texture    03/05/2015     140   02/14/15 63.866 kg (140 lb 12.8 oz)  02/11/15 63.957 kg (141 lb)  02/06/15 63.107 kg (139 lb 2 oz)    Vital signs reviewed   HEENT: nl dentition, turbinates, and oropharynx. Nl external ear canals without cough reflex   NECK :  without JVD/Nodes/TM/ nl carotid upstrokes bilaterally   LUNGS: no acc muscle use, clear to A and P bilaterally without cough on insp or exp maneuvers   CV:  RRR  no s3 or murmur or increase in P2, no edema   ABD:  soft and nontender with nl excursion in the supine position. No bruits or organomegaly, bowel sounds nl  MS:  warm without deformities, calf tenderness, cyanosis or clubbing  SKIN: warm and dry without lesions    NEURO:  alert, approp, no deficits     I personally reviewed images and agree with radiology impression as follows:  CXR:   02/11/15 Chronically increased pulmonary interstitial markings are unchanged since February 01, 2015, but are slightly  more conspicuous than on the study of September 2015. There is no alveolar pneumonia nor pulmonary edema. The findings may reflect acute bronchitic changes superimposed upon chronic pulmonary fibrosis.        Assessment:

## 2015-03-09 ENCOUNTER — Other Ambulatory Visit: Payer: Self-pay | Admitting: Family Medicine

## 2015-03-11 NOTE — Telephone Encounter (Signed)
Medication filled to pharmacy as requested.   

## 2015-03-12 ENCOUNTER — Telehealth: Payer: Self-pay | Admitting: Family Medicine

## 2015-03-12 MED ORDER — POTASSIUM CHLORIDE CRYS ER 20 MEQ PO TBCR: 40.0000 meq | EXTENDED_RELEASE_TABLET | Freq: Every day | ORAL | Status: DC

## 2015-03-12 NOTE — Telephone Encounter (Signed)
The names of the medications for the previous message sent are 1. Furosemide  2. Potassium

## 2015-03-12 NOTE — Telephone Encounter (Signed)
OK to send rx for 2 kdur tabs daily.  Is she taking 40 in AM or 36mEQ bid?

## 2015-03-12 NOTE — Telephone Encounter (Signed)
Caller name: Laressa   Relationship to patient: Self   Can be reached: [phone]  Pharmacy: [id number] - [place], [place] - [address] AT [address]  Reason for call: pt says that PCP called in her Rx but he only prescribed 1 tablet a day instead of 2. She says PCP made adjustment to her medication.    Please advise further.

## 2015-03-12 NOTE — Telephone Encounter (Signed)
Spoke with pt and she states that she has been taking 2 potassium daily "forever" because PCP told her to increase it due to being on lasix and making it lower. Pt states she has been hospitalized in the past due to her low potassium and would like Rx to reflect 2 tablets daily. Please advise if ok to send in Rx as pt requesting?

## 2015-03-12 NOTE — Telephone Encounter (Signed)
Pt said she was taking 2 at one time. Rx sent and left detailed message on pt's cell#.

## 2015-03-25 ENCOUNTER — Other Ambulatory Visit: Payer: Self-pay | Admitting: Family Medicine

## 2015-04-16 ENCOUNTER — Ambulatory Visit (INDEPENDENT_AMBULATORY_CARE_PROVIDER_SITE_OTHER): Payer: Medicare Other | Admitting: Internal Medicine

## 2015-04-16 ENCOUNTER — Encounter: Payer: Self-pay | Admitting: Internal Medicine

## 2015-04-16 VITALS — BP 124/80 | HR 60 | Ht 63.5 in | Wt 138.0 lb

## 2015-04-16 DIAGNOSIS — R058 Other specified cough: Secondary | ICD-10-CM

## 2015-04-16 DIAGNOSIS — R05 Cough: Secondary | ICD-10-CM

## 2015-04-16 NOTE — Assessment & Plan Note (Signed)
rx max gerd 02/24/15 and d/c qvar > 85% improved, still some pm coughing > rec add 1st gen H1 at hs > resolved, try off all rx  I had an extended final summary discussion with the patient reviewing all relevant studies completed to date and  lasting 15 to 20 minutes of a 25 minute visit on the following issues:    Reminded re :  Explained the natural history of uri and why it's necessary in patients at risk to treat GERD aggressively - at least  short term -   to reduce risk of evolving cyclical cough initially  triggered by epithelial injury and a heightened sensitivty to the effects of any upper airway irritants,  most importantly acid - related - then perpetuated by epithelial injury related to the cough itself as the upper airway collapses on itself.  That is, the more sensitive the epithelium becomes once it is damaged by the virus, the more the ensuing irritability> the more the cough, the more the secondary reflux (especially in those prone to reflux) the more the irritation of the sensitive mucosa and so on in a  Classic cyclical pattern.    Pulmonary f/u is prn

## 2015-04-16 NOTE — Progress Notes (Signed)
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    History of Present Illness  02/14/2015 1st [facility] office visit/ Geovannie Vilar   Chief Complaint  Patient presents with  . Pulmonary Consult    Pt c/o dry cough with wheeze x 2weeks. Pt has been using albuterol HFA with some symptom relief.   some better after nebulizer in office and prednisone rx but only maybe 25% at most  Worse with voice use / not productive / only sob when coughing/ not present at hs  No better on qvar  rec Depomedrol 120 mg IM today  For cough > use robitussin with codeine and the flutter valve as much as you can  For breathing > ok to use albuterol up to 2 pffs and leave off qvar for now Pantoprazole (protonix) 40 mg   Take  30-60 min before first meal of the day and Pepcid (famotidine)  20 mg one @  bedtime until return to office - this is the best way to tell whether stomach acid is contributing to your problem.  GERD (REFLUX) diet     03/05/2015  f/u ov/[place] Route re: 85% improvement in acute cough  Chief Complaint  Patient presents with  . Follow-up    Pt states her cough has improved some. She has no new co's today. She uses albuterol once per night on average.   using saba 4/7 nights at bedtime   ? Why rec Try off the albuterol first and if can't live without it call me at [phone] to arrange a methacholine challenge test  Try take CHLORPHENIRAMINE  4 mg -  1-2 tablets at bedtime in addition to pepcid 20 mg at bedtime     04/16/2015  f/u ov/Prateek Knipple re: cough since oct 2016   Chief Complaint  Patient presents with  . Follow-up    Pt states cough is much improved since last  visit. She has not needed albuterol. No new co's today.   off all rx / singing ok  - Not limited by breathing from desired activities       No obvious day to day or daytime variability or assoc sob or cp or chest tightness, subjective wheeze or overt sinus or hb symptoms. No unusual exp hx or h/o childhood pna/ asthma or knowledge of premature birth.  Sleeping ok without nocturnal  or early am exacerbation  of respiratory  c/o's or need for noct saba. Also denies any obvious fluctuation of symptoms with weather or environmental changes or other aggravating or alleviating factors except as outlined above   Current Medications, Allergies, Complete Past Medical History, Past Surgical History, Family History, and Social History were reviewed in [employer] record.  ROS  The following are not active complaints unless bolded sore throat, dysphagia, dental problems, itching, sneezing,  nasal congestion or excess/ purulent secretions, ear ache,   fever, chills, sweats, unintended wt loss, classically pleuritic or exertional cp, hemoptysis,  orthopnea pnd or leg swelling, presyncope, palpitations, abdominal pain, anorexia, nausea, vomiting, diarrhea  or change in bowel or bladder habits, change in  stools or urine, dysuria,hematuria,  rash, arthralgias, visual complaints, headache, numbness, weakness or ataxia or problems with walking or coordination,  change in mood/affect or memory.             Objective:   Physical Exam   amb  wf nad   04/16/2015        138   03/05/2015     140   02/14/15 63.866 kg (140 lb 12.8 oz)  02/11/15 63.957 kg (141 lb)  02/06/15 63.107 kg (139 lb 2 oz)    Vital signs reviewed   HEENT: nl dentition, turbinates, and oropharynx. Nl external ear canals without cough reflex   NECK :  without JVD/Nodes/TM/ nl carotid upstrokes bilaterally   LUNGS: no acc muscle use, clear to A and P bilaterally without cough on insp or exp maneuvers   CV:  RRR   no s3 or murmur or increase in P2, no edema   ABD:  soft and nontender with nl excursion in the supine position. No bruits or organomegaly, bowel sounds nl  MS:  warm without deformities, calf tenderness, cyanosis or clubbing  SKIN: warm and dry without lesions    NEURO:  alert, approp, no deficits     I personally reviewed images and agree with radiology impression as follows:  CXR:   02/11/15 Chronically increased pulmonary interstitial markings are unchanged since February 01, 2015, but are slightly more conspicuous than on the study of September 2015. There is no alveolar pneumonia nor pulmonary edema. The findings may reflect acute bronchitic changes superimposed upon chronic pulmonary fibrosis.        Assessment:

## 2015-04-16 NOTE — Patient Instructions (Signed)
At the onset of cough for any reason I recommend  Delsym cough syrup   Try prilosec otc 20mg   Take 30-60 min before first meal of the day and Pepcid ac (famotidine) 20 mg one @  bedtime until cough is completely gone for at least a week without the need for cough suppression  GERD (REFLUX)  is an extremely common cause of respiratory symptoms just like yours , many times with no obvious heartburn at all.    It can be treated with medication, but also with lifestyle changes including elevation of the head of your bed (ideally with 6 inch  bed blocks),  Smoking cessation, avoidance of late meals, excessive alcohol, and avoid fatty foods, chocolate, peppermint, colas, red wine, and acidic juices such as orange juice.  NO MINT OR MENTHOL PRODUCTS SO NO COUGH DROPS  USE SUGARLESS CANDY INSTEAD (Jolley ranchers or Stover's or Life Savers) or even ice chips will also do - the key is to swallow to prevent all throat clearing. NO OIL BASED VITAMINS - use powdered substitutes.   For any cough lasting more than 3 weeks please return to office

## 2015-04-18 ENCOUNTER — Other Ambulatory Visit: Payer: Self-pay | Admitting: *Deleted

## 2015-04-18 ENCOUNTER — Other Ambulatory Visit: Payer: Self-pay

## 2015-04-18 MED ORDER — FAMOTIDINE 20 MG PO TABS: 20.0000 mg | ORAL_TABLET | Freq: Every day | ORAL | Status: DC

## 2015-04-18 MED ORDER — SYNTHROID 112 MCG PO TABS: ORAL_TABLET | ORAL | Status: DC

## 2015-04-18 MED ORDER — CLOPIDOGREL BISULFATE 75 MG PO TABS: ORAL_TABLET | ORAL | Status: DC

## 2015-04-18 MED ORDER — DICYCLOMINE HCL 20 MG PO TABS: ORAL_TABLET | ORAL | Status: DC

## 2015-05-03 ENCOUNTER — Ambulatory Visit: Payer: Medicare Other | Admitting: Medical

## 2015-05-03 ENCOUNTER — Telehealth: Payer: Self-pay | Admitting: Behavioral Health

## 2015-05-03 ENCOUNTER — Encounter: Payer: Self-pay | Admitting: Internal Medicine

## 2015-05-03 ENCOUNTER — Ambulatory Visit (INDEPENDENT_AMBULATORY_CARE_PROVIDER_SITE_OTHER): Payer: Medicare Other | Admitting: Internal Medicine

## 2015-05-03 VITALS — BP 138/76 | HR 70 | Temp 97.7°F | Ht 63.5 in | Wt 134.4 lb

## 2015-05-03 DIAGNOSIS — T148 Other injury of unspecified body region: Secondary | ICD-10-CM | POA: Diagnosis not present

## 2015-05-03 DIAGNOSIS — T148XXA Other injury of unspecified body region, initial encounter: Secondary | ICD-10-CM

## 2015-05-03 NOTE — Telephone Encounter (Signed)
Left message to follow-up with the patient regarding the cut on her leg. An appointment has been made for the patient to be seen today, 1/27 at 4:00 PM.

## 2015-05-03 NOTE — Telephone Encounter (Signed)
Patient reported that after removing her support hose last night, she noticed active bleeding on the left leg. She voiced that she's not sure how this actually occurred, but finally was able to get the bleeding to stop at some point after 11 PM. Patient stated, "I'm not aware of how long I actually bled, but if it did not stop, I would have went to the ED". She could not describe the appearance of her leg; at this time the patient has a band aid over the site and no bleeding. Patient reports no pain as well. Confirmed with the patient that her appointment is for today, 05/03/15 at 4:00 PM.

## 2015-05-03 NOTE — Progress Notes (Deleted)
Patient ID: Alexandria [name], female   DOB: 1945/09/17, 70 y.o.   MRN: [id number] Pre visit review using our clinic review tool, if applicable. No additional management support is needed unless otherwise documented below in the visit note.

## 2015-05-03 NOTE — Progress Notes (Signed)
Subjective:    Patient ID: Alexandria [name], female    DOB: 1945/07/03, 70 y.o.   MRN: [id number]  DOS:  05/03/2015 Type of visit - description : Acute Interval history:  A week ago had an area of bleeding on the right arm. Last night, she felt like her compression stocking was stuck to the skin, she pulled it down slowly but nevertheless had an area of bleeding from the left leg. It stopped after several minutes.   Review of Systems Denies any nose or gum bleeding. No blood in the urine or in the stools No rash  Past Medical History  Diagnosis Date  . Arthritis   . Diverticulitis   . Migraines   . DVT (deep venous thrombosis) ([place])     lower extremity  . Colon polyp   . Thyroid disease   . TIA (transient ischemic attack)     8 years ago  . Heart attack ([place])     10 years ago  . Heart murmur   . H/O cardiac catheterization 2004    Normal coronary arteries  . Hx of echocardiogram 01/27/2010    Normal Ef 55% the transmitral spectral doppler flow pattern is normal for age. the left ventricular wall motion is normal  . History of stress test 05/21/2011  . Asthmatic bronchitis with acute exacerbation 02/06/2015    Past Surgical History  Procedure Laterality Date  . Vaginal hysterectomy  10/17/1998    Dory Horn  . Breast biopsy      Isaiah Blakes  . Total knee arthroplasty      Bilateral x's 2  . Neck surgery      x's 2  . Cardiac catheterization      10/2013  . Left heart catheterization with coronary angiogram N/A 10/25/2013    Procedure: LEFT HEART CATHETERIZATION WITH CORONARY ANGIOGRAM;  Surgeon: Troy Sine, MD;  Location: [facility] CATH LAB;  Service: Cardiovascular;  Laterality: N/A;    Social History   Social History  . Marital Status: Married    Spouse Name: N/A  . Number of Children: 3  . Years of Education: N/A   Occupational History  . DISABLED    Social History Main Topics  . Smoking status: Former Smoker -- 2 years    Quit date: 04/06/1969  .  Smokeless tobacco: Never Used     Comment: socially smoked x 2 years  . Alcohol Use: No  . Drug Use: No  . Sexual Activity: Not on file   Other Topics Concern  . Not on file   Social History Narrative        Medication List       This list is accurate as of: 05/03/15 11:59 PM.  Always use your most recent med list.               BREWERS YEAST PO  Take 2 tablets by mouth 2 (two) times daily.     clopidogrel 75 MG tablet  Commonly known as:  PLAVIX  TAKE 1 TABLET (75 MG TOTAL) BY MOUTH ONCE.     dicyclomine 20 MG tablet  Commonly known as:  BENTYL  TAKE 1 TABLET BY MOUTH 4 TIMES A DAY AS NEEDED     estropipate 1.5 MG tablet  Commonly known as:  OGEN  Take 1.5 mg by mouth daily.     famotidine 20 MG tablet  Commonly known as:  PEPCID  Take 1 tablet (20 mg total) by mouth at bedtime.  FLUTTER Devi  Use as directed     furosemide 40 MG tablet  Commonly known as:  LASIX  TAKE 1 TABLET (40 MG TOTAL) BY MOUTH 2 (TWO) TIMES DAILY.     multivitamin tablet  Take 1 tablet by mouth daily.     potassium chloride SA 20 MEQ tablet  Commonly known as:  K-DUR,KLOR-CON  Take 2 tablets (40 mEq total) by mouth daily.     RA BIOTIN 1000 MCG tablet  Generic drug:  Biotin  Take 1,000 mcg by mouth 2 (two) times daily.     SYNTHROID 112 MCG tablet  Generic drug:  levothyroxine  TAKE 1 TABLET (112 MCG TOTAL) BY MOUTH DAILY BEFORE BREAKFAST.     VITAMIN C PO  Take 2 tablets by mouth daily.     vitamin D (CHOLECALCIFEROL) 400 units tablet  Take 400 Units by mouth 2 (two) times daily.           Objective:   Physical Exam  Skin:      BP 138/76 mmHg  Pulse 70  Temp(Src) 97.7 F (36.5 C) (Oral)  Ht 5' 3.5" (1.613 m)  Wt 134 lb 6.4 oz (60.963 kg)  BMI 23.43 kg/m2  SpO2 97% General:   Well developed, well nourished . NAD.  HEENT:  Normocephalic . Face symmetric, atraumatic Neurologic:  alert & oriented X3.  Speech normal, gait appropriate for age and  unassisted Psych--  Cognition and judgment appear intact.  Cooperative with normal attention span and concentration.  Behavior appropriate. No anxious or depressed appearing.      Assessment & Plan:   Skin Avulsions She has 2 skin avulsions with mild bleeding.  No evidence of serious bleeding on clinical grounds, no petechia. Recommend to be extremely careful with her skin, local care for the area of bleeding including pressure.  She is on Plavix, allergic to Motrin but reports she is not allergic to aspirin. Her PMH includes history of TIA about 11 years ago. I wonder if Plavix could be switch to aspirin. Recommend to discuss that with PCP or cardiology.

## 2015-05-03 NOTE — Progress Notes (Signed)
Patient ID: Alexandria [name], female   DOB: May 31, 1945, 70 y.o.   MRN: [id number]   Pre visit review using our clinic review tool, if applicable. No additional management support is needed unless otherwise documented below in the visit note.

## 2015-05-03 NOTE — Telephone Encounter (Signed)
Opened in error

## 2015-05-03 NOTE — Patient Instructions (Signed)
Apply pressure to the area of bleeding.  If the bleeding is persisting, severe: Call the office  Please schedule a routine visit with your primary doctor.

## 2015-05-06 ENCOUNTER — Other Ambulatory Visit: Payer: Self-pay | Admitting: Family Medicine

## 2015-05-07 ENCOUNTER — Ambulatory Visit (INDEPENDENT_AMBULATORY_CARE_PROVIDER_SITE_OTHER): Payer: Medicare Other | Admitting: Family Medicine

## 2015-05-07 ENCOUNTER — Ambulatory Visit: Payer: Medicare Other | Admitting: Family Medicine

## 2015-05-07 ENCOUNTER — Encounter: Payer: Self-pay | Admitting: Family Medicine

## 2015-05-07 VITALS — BP 130/78 | HR 67 | Temp 98.2°F | Wt 134.4 lb

## 2015-05-07 DIAGNOSIS — T148 Other injury of unspecified body region: Secondary | ICD-10-CM | POA: Diagnosis not present

## 2015-05-07 DIAGNOSIS — I252 Old myocardial infarction: Secondary | ICD-10-CM

## 2015-05-07 DIAGNOSIS — E039 Hypothyroidism, unspecified: Secondary | ICD-10-CM

## 2015-05-07 DIAGNOSIS — T148XXA Other injury of unspecified body region, initial encounter: Secondary | ICD-10-CM

## 2015-05-07 LAB — POCT URINALYSIS DIPSTICK
BILIRUBIN UA: NEGATIVE
Glucose, UA: NEGATIVE
KETONES UA: NEGATIVE
Leukocytes, UA: NEGATIVE
NITRITE UA: NEGATIVE
Protein, UA: NEGATIVE
RBC UA: NEGATIVE
Spec Grav, UA: >=1.03
Urobilinogen, UA: 0.2
pH, UA: 6

## 2015-05-07 NOTE — Patient Instructions (Signed)

## 2015-05-07 NOTE — Progress Notes (Signed)
Pre visit review using our clinic review tool, if applicable. No additional management support is needed unless otherwise documented below in the visit note. 

## 2015-05-08 DIAGNOSIS — T148XXA Other injury of unspecified body region, initial encounter: Secondary | ICD-10-CM | POA: Insufficient documentation

## 2015-05-08 DIAGNOSIS — I252 Old myocardial infarction: Secondary | ICD-10-CM | POA: Insufficient documentation

## 2015-05-08 LAB — COMPREHENSIVE METABOLIC PANEL
ALBUMIN: 4.2 g/dL (ref 3.5–5.2)
ALT: 20 U/L (ref 0–35)
AST: 24 U/L (ref 0–37)
Alkaline Phosphatase: 75 U/L (ref 39–117)
BILIRUBIN TOTAL: 0.5 mg/dL (ref 0.2–1.2)
BUN: 13 mg/dL (ref 6–23)
CALCIUM: 9.5 mg/dL (ref 8.4–10.5)
CHLORIDE: 96 meq/L (ref 96–112)
CO2: 35 meq/L — AB (ref 19–32)
CREATININE: 0.81 mg/dL (ref 0.40–1.20)
GFR: 74.21 mL/min (ref >60.00)
GLUCOSE: 92 mg/dL (ref 70–99)
Potassium: 3.2 mEq/L — ABNORMAL LOW (ref 3.5–5.1)
SODIUM: 141 meq/L (ref 135–145)
Total Protein: 7.7 g/dL (ref 6.0–8.3)

## 2015-05-08 LAB — PROTIME-INR
INR: 1.1 ratio — AB (ref 0.8–1.0)
PROTHROMBIN TIME: 11 s (ref 9.6–13.1)

## 2015-05-08 LAB — CBC WITH DIFFERENTIAL/PLATELET
BASOS ABS: 0.1 10*3/uL (ref 0.0–0.1)
BASOS PCT: 0.9 % (ref 0.0–3.0)
EOS ABS: 0.3 10*3/uL (ref 0.0–0.7)
Eosinophils Relative: 4 % (ref 0.0–5.0)
HEMATOCRIT: 44.3 % (ref 36.0–46.0)
Hemoglobin: 14.8 g/dL (ref 12.0–15.0)
LYMPHS ABS: 2.3 10*3/uL (ref 0.7–4.0)
LYMPHS PCT: 34.5 % (ref 12.0–46.0)
MCHC: 33.4 g/dL (ref 30.0–36.0)
MCV: 100.5 fl — ABNORMAL HIGH (ref 78.0–100.0)
MONO ABS: 0.7 10*3/uL (ref 0.1–1.0)
Monocytes Relative: 11.4 % (ref 3.0–12.0)
NEUTROS ABS: 3.2 10*3/uL (ref 1.4–7.7)
NEUTROS PCT: 49.2 % (ref 43.0–77.0)
PLATELETS: 247 10*3/uL (ref 150.0–400.0)
RBC: 4.41 Mil/uL (ref 3.87–5.11)
RDW: 13.1 % (ref 11.5–15.5)
WBC: 6.5 10*3/uL (ref 4.0–10.5)

## 2015-05-08 LAB — LIPID PANEL
CHOL/HDL RATIO: 3
Cholesterol: 210 mg/dL — ABNORMAL HIGH (ref 0–200)
HDL: 78.9 mg/dL (ref >39.00)
LDL Cholesterol: 119 mg/dL — ABNORMAL HIGH (ref 0–99)
NONHDL: 131.01
TRIGLYCERIDES: 58 mg/dL (ref 0.0–149.0)
VLDL: 11.6 mg/dL (ref 0.0–40.0)

## 2015-05-08 LAB — APTT: APTT: 27.8 s (ref 23.4–32.7)

## 2015-05-08 LAB — TSH: TSH: 0.6 u[IU]/mL (ref 0.35–4.50)

## 2015-05-08 NOTE — Assessment & Plan Note (Signed)
Check labs Probably due to plavix Pt will discuss this with cardiology

## 2015-05-08 NOTE — Progress Notes (Signed)
Patient ID: Alexandria [name], female    DOB: June 30, 1945  Age: 70 y.o. MRN: [id number]    Subjective:  Subjective HPI Alexandria [name] presents for f/u bruising.  Area on her leg is healing well.  Pt has appointment with cardiology and she will discuss plavix with them.  Pt with hx MI several years ago.  Pt is bruising very easily and her skin is tearing.  She is wanting to switch to aspirin if possible.    Review of Systems  Constitutional: Negative for diaphoresis, appetite change, fatigue and unexpected weight change.  Eyes: Negative for pain, redness and visual disturbance.  Respiratory: Negative for cough, chest tightness, shortness of breath and wheezing.   Cardiovascular: Negative for chest pain, palpitations and leg swelling.  Endocrine: Negative for cold intolerance, heat intolerance, polydipsia, polyphagia and polyuria.  Genitourinary: Negative for dysuria, frequency and difficulty urinating.  Neurological: Negative for dizziness, light-headedness, numbness and headaches.    History Past Medical History  Diagnosis Date  . Arthritis   . Diverticulitis   . Migraines   . DVT (deep venous thrombosis) ([place])     lower extremity  . Colon polyp   . Thyroid disease   . TIA (transient ischemic attack)     8 years ago  . Heart murmur   . H/O cardiac catheterization 2004    Normal coronary arteries  . Hx of echocardiogram 01/27/2010    Normal Ef 55% the transmitral spectral doppler flow pattern is normal for age. the left ventricular wall motion is normal  . History of stress test 05/21/2011  . Asthmatic bronchitis with acute exacerbation 02/06/2015  . Heart attack ([place])     10 years ago    She has past surgical history that includes Vaginal hysterectomy (10/17/1998); Breast biopsy; Total knee arthroplasty; Neck surgery; Cardiac catheterization; and left heart catheterization with coronary angiogram (N/A, 10/25/2013).   Her family history includes Colon cancer in her  brother and father; Diabetes in her maternal grandmother; HIV in her brother; Heart disease in her maternal grandmother; Hypertension in her maternal grandmother; Kidney cancer in her brother; Lung cancer in her brother; Other in her brother; Ovarian cancer in her mother; Prostate cancer in her father; Stroke in her maternal grandmother; Uterine cancer in her mother.She reports that she quit smoking about 46 years ago. She has never used smokeless tobacco. She reports that she does not drink alcohol or use illicit drugs.  Current Outpatient Prescriptions on File Prior to Visit  Medication Sig Dispense Refill  . Ascorbic Acid (VITAMIN C PO) Take 2 tablets by mouth daily.     . Biotin (RA BIOTIN) 1000 MCG tablet Take 1,000 mcg by mouth 2 (two) times daily.     Marland Kitchen BREWERS YEAST PO Take 2 tablets by mouth 2 (two) times daily.     . clopidogrel (PLAVIX) 75 MG tablet TAKE 1 TABLET (75 MG TOTAL) BY MOUTH ONCE. 90 tablet 1  . dicyclomine (BENTYL) 20 MG tablet TAKE 1 TABLET BY MOUTH 4 TIMES A DAY AS NEEDED 360 tablet 1  . estropipate (OGEN) 1.5 MG tablet Take 1.5 mg by mouth daily.    . famotidine (PEPCID) 20 MG tablet Take 1 tablet (20 mg total) by mouth at bedtime. 90 tablet 3  . furosemide (LASIX) 40 MG tablet TAKE 1 TABLET (40 MG TOTAL) BY MOUTH 2 (TWO) TIMES DAILY. 60 tablet 3  . Multiple Vitamin (MULTIVITAMIN) tablet Take 1 tablet by mouth daily.    . potassium chloride  SA (K-DUR,KLOR-CON) 20 MEQ tablet Take 2 tablets (40 mEq total) by mouth daily. 60 tablet 2  . Respiratory Therapy Supplies (FLUTTER) DEVI Use as directed 1 each 0  . SYNTHROID 112 MCG tablet TAKE 1 TABLET (112 MCG TOTAL) BY MOUTH DAILY BEFORE BREAKFAST. 90 tablet 1  . vitamin D, CHOLECALCIFEROL, 400 UNITS tablet Take 400 Units by mouth 2 (two) times daily.      No current facility-administered medications on file prior to visit.     Objective:  Objective Physical Exam  Constitutional: She is oriented to person, place, and time.  She appears well-developed and well-nourished.  HENT:  Head: Normocephalic and atraumatic.  Eyes: Conjunctivae and EOM are normal.  Neck: Normal range of motion. Neck supple. No JVD present. Carotid bruit is not present. No thyromegaly present.  Cardiovascular: Normal rate, regular rhythm and normal heart sounds.   No murmur heard. Pulmonary/Chest: Effort normal and breath sounds normal. No respiratory distress. She has no wheezes. She has no rales. She exhibits no tenderness.  Musculoskeletal: She exhibits no edema.  Neurological: She is alert and oriented to person, place, and time.  Psychiatric: She has a normal mood and affect.  Nursing note and vitals reviewed.  BP 130/78 mmHg  Pulse 67  Temp(Src) 98.2 F (36.8 C) (Oral)  Wt 134 lb 6.4 oz (60.963 kg)  SpO2 97% Wt Readings from Last 3 Encounters:  05/07/15 134 lb 6.4 oz (60.963 kg)  05/03/15 134 lb 6.4 oz (60.963 kg)  04/16/15 138 lb (62.596 kg)     Lab Results  Component Value Date   WBC 3.5* 12/20/2014   HGB 13.4 12/20/2014   HCT 39.1 12/20/2014   PLT 220.0 12/20/2014   GLUCOSE 83 12/20/2014   ALT 14 12/20/2014   AST 22 12/20/2014   NA 136 12/20/2014   K 3.9 12/20/2014   CL 99 12/20/2014   CREATININE 1.09 12/20/2014   BUN 18 12/20/2014   CO2 30 12/20/2014   TSH 1.24 11/29/2014   INR 1.02 10/20/2013    Dg Chest 2 View  02/12/2015  CLINICAL DATA:  Two weeks of cough and wheezing, remote history of smoking. EXAM: CHEST  2 VIEW COMPARISON:  PA and lateral chest x-ray of February 01, 2015, and January 01, 2014. FINDINGS: The lungs are well-expanded. The interstitial markings remain increased bilaterally. There is no alveolar infiltrate. There is no pleural effusion. The heart and pulmonary vascularity are normal. The mediastinum is normal in width. There is stable S-shaped thoraco lumbar scoliosis. IMPRESSION: Chronically increased pulmonary interstitial markings are unchanged since February 01, 2015, but are slightly  more conspicuous than on the study of September 2015. There is no alveolar pneumonia nor pulmonary edema. The findings may reflect acute bronchitic changes superimposed upon chronic pulmonary fibrosis. Follow-up radiographs following anticipated antibiotic therapy would be useful. If the patient's symptoms persist despite therapy, high-resolution chest CT scanning may be useful. Electronically Signed   By: David  Martinique M.D.   On: 02/12/2015 08:16     Assessment & Plan:  Plan I am having Ms. Deines maintain her vitamin D (CHOLECALCIFEROL), Ascorbic Acid (VITAMIN C PO), BREWERS YEAST PO, multivitamin, Biotin, FLUTTER, potassium chloride SA, estropipate, famotidine, dicyclomine, SYNTHROID, clopidogrel, and furosemide.  No orders of the defined types were placed in this encounter.    Problem List Items Addressed This Visit    None    Visit Diagnoses    Bruising    -  Primary    Relevant Orders  Comp Met (CMET)    CBC with Differential/Platelet    Lipid panel    POCT urinalysis dipstick (Completed)    TSH    PTT    Protime-INR    Hypothyroidism, unspecified hypothyroidism type        Relevant Orders    Comp Met (CMET)    CBC with Differential/Platelet    Lipid panel    POCT urinalysis dipstick (Completed)    TSH    PTT    Protime-INR       Follow-up: Return if symptoms worsen or fail to improve.  [name], DO

## 2015-05-13 ENCOUNTER — Telehealth: Payer: Self-pay | Admitting: Family Medicine

## 2015-05-13 NOTE — Telephone Encounter (Signed)
Inc KCL to 3 a day or I can change the diuretic.  What does the pt prefer.?

## 2015-05-13 NOTE — Telephone Encounter (Signed)
Relation to [id number] Call back [place]:  Reason for call:  Patient inquiring about urine/lab results taken 05/07/15. Please advise

## 2015-05-13 NOTE — Telephone Encounter (Signed)
Notes Recorded by Rosalita Chessman, DO on 05/11/2015 at 1:12 PM Potassium is low --- is she taking the KCL as prescribed? Otherwise labs are essentially normal   Discussed with patient and she stated she is taking her potassium twice a day as directed. Please advise    KP

## 2015-05-14 MED ORDER — POTASSIUM CHLORIDE CRYS ER 20 MEQ PO TBCR: 60.0000 meq | EXTENDED_RELEASE_TABLET | Freq: Every day | ORAL | Status: DC

## 2015-05-14 NOTE — Telephone Encounter (Signed)
Patient aware and she said she would rather take 3 of the potassium, she has a follow up scheduled with Cardiology.    KP

## 2015-05-14 NOTE — Telephone Encounter (Signed)
Ok

## 2015-05-20 ENCOUNTER — Encounter: Payer: Self-pay | Admitting: Cardiology

## 2015-05-20 ENCOUNTER — Ambulatory Visit (INDEPENDENT_AMBULATORY_CARE_PROVIDER_SITE_OTHER): Payer: Medicare Other | Admitting: Cardiology

## 2015-05-20 VITALS — BP 128/72 | HR 65 | Ht 64.0 in | Wt 136.1 lb

## 2015-05-20 DIAGNOSIS — Z0389 Encounter for observation for other suspected diseases and conditions ruled out: Secondary | ICD-10-CM | POA: Diagnosis not present

## 2015-05-20 DIAGNOSIS — R938 Abnormal findings on diagnostic imaging of other specified body structures: Secondary | ICD-10-CM

## 2015-05-20 DIAGNOSIS — R0602 Shortness of breath: Secondary | ICD-10-CM | POA: Diagnosis not present

## 2015-05-20 DIAGNOSIS — I4510 Unspecified right bundle-branch block: Secondary | ICD-10-CM | POA: Insufficient documentation

## 2015-05-20 DIAGNOSIS — R079 Chest pain, unspecified: Secondary | ICD-10-CM

## 2015-05-20 DIAGNOSIS — R600 Localized edema: Secondary | ICD-10-CM | POA: Diagnosis not present

## 2015-05-20 DIAGNOSIS — R9389 Abnormal findings on diagnostic imaging of other specified body structures: Secondary | ICD-10-CM | POA: Insufficient documentation

## 2015-05-20 DIAGNOSIS — IMO0001 Reserved for inherently not codable concepts without codable children: Secondary | ICD-10-CM | POA: Insufficient documentation

## 2015-05-20 NOTE — Assessment & Plan Note (Signed)
Mild, bilateral varicosities. She wears compression stockings and is on Lasix.

## 2015-05-20 NOTE — Assessment & Plan Note (Signed)
Chronic. 

## 2015-05-20 NOTE — Progress Notes (Signed)
05/20/2015 Alexandria [name]   1945-10-23  [id number]  Primary Physician [name], DO Primary Cardiologist: Dr Claiborne Billings  HPI:  70 y/o female with a history of two prior caths showing normal coronaries, the most recent one in July 2015. Echo then showed normal LVF with AOV sclerosis and diastolic dysfunction. She is seen in the office today with multiple complaints and told me "it was time".  She has not Dr Claiborne Billings in a few years (no records in [facility]), and her LOV with cardiology was in Aug 2015. She complains of easy bruising. She is on Plavix- she tells me Dr Claiborne Billings has her on this for a history of a remote MI (?).  She has chronic vague SOB. She saw Dr Melvyn Novas in Jan. She has some chest pain but it sounds atypical lasting only seconds. She has LE edema but this is chronic and felt to be secondary to venous insufficiency.    Current Outpatient Prescriptions  Medication Sig Dispense Refill  . Ascorbic Acid (VITAMIN C PO) Take 2 tablets by mouth daily.     . Biotin (RA BIOTIN) 1000 MCG tablet Take 1,000 mcg by mouth 2 (two) times daily.     Marland Kitchen BREWERS YEAST PO Take 2 tablets by mouth 2 (two) times daily.     . clopidogrel (PLAVIX) 75 MG tablet TAKE 1 TABLET (75 MG TOTAL) BY MOUTH ONCE. 90 tablet 1  . dicyclomine (BENTYL) 20 MG tablet TAKE 1 TABLET BY MOUTH 4 TIMES A DAY AS NEEDED 360 tablet 1  . estradiol (ESTRACE) 2 MG tablet Take 2 mg by mouth daily.  11  . estropipate (OGEN) 1.5 MG tablet Take 1.5 mg by mouth daily.    . furosemide (LASIX) 40 MG tablet TAKE 1 TABLET (40 MG TOTAL) BY MOUTH 2 (TWO) TIMES DAILY. 60 tablet 3  . Multiple Vitamin (MULTIVITAMIN) tablet Take 1 tablet by mouth daily.    . potassium chloride SA (K-DUR,KLOR-CON) 20 MEQ tablet Take 3 tablets (60 mEq total) by mouth daily. 90 tablet 2  . Respiratory Therapy Supplies (FLUTTER) DEVI Use as directed 1 each 0  . SYNTHROID 112 MCG tablet TAKE 1 TABLET (112 MCG TOTAL) BY MOUTH DAILY BEFORE BREAKFAST. 90 tablet 1  . vitamin D,  CHOLECALCIFEROL, 400 UNITS tablet Take 400 Units by mouth 2 (two) times daily.      No current facility-administered medications for this visit.    Allergies  Allergen Reactions  . Prednisone Other (See Comments)    Homicidal  . Atrovent Hfa [Ipratropium Bromide Hfa] Other (See Comments)    Pt could not sleep  . Cheratussin Ac [Guaifenesin-Codeine]   . Hydrocodone Itching  . Influenza Vaccines Other (See Comments)    Pt reports heart attack after last flu shot  . Motrin [Ibuprofen] Other (See Comments)    "Gives false reading in blood"    Social History   Social History  . Marital Status: Married    Spouse Name: N/A  . Number of Children: 3  . Years of Education: N/A   Occupational History  . DISABLED    Social History Main Topics  . Smoking status: Former Smoker -- 2 years    Quit date: 04/06/1969  . Smokeless tobacco: Never Used     Comment: socially smoked x 2 years  . Alcohol Use: No  . Drug Use: No  . Sexual Activity: Not on file   Other Topics Concern  . Not on file   Social History Narrative  Review of Systems: General: negative for chills, fever, night sweats or weight changes.  Cardiovascular: negative for chest pain, dyspnea on exertion, edema, orthopnea, palpitations, paroxysmal nocturnal dyspnea or shortness of breath Dermatological: negative for rash Respiratory: negative for cough or wheezing Urologic: negative for hematuria Abdominal: negative for nausea, vomiting, diarrhea, bright red blood per rectum, melena, or hematemesis Neurologic: negative for visual changes, syncope, or dizziness All other systems reviewed and are otherwise negative except as noted above.    Blood pressure 128/72, pulse 65, height 5\' 4"  (1.626 m), weight 136 lb 1.6 oz (61.735 kg).  General appearance: alert, cooperative and no distress Neck: no carotid bruit and no JVD Lungs: clear to auscultation bilaterally Heart: regular rate and rhythm Extremities: trace  edema, compression stockings in place Skin: Skin color, texture, turgor normal. No rashes or lesions Neurologic: Grossly normal  EKG NSR, HR 65- RBBB (old)  ASSESSMENT AND PLAN:   SOB (shortness of breath) Pt complains of vague, non exertional SOB  Chest pain at rest Pt complains of rest chest discomfort- "lasts seconds"  Edema of both legs Mild, bilateral varicosities. She wears compression stockings and is on Lasix.  Normal coronary arteries 2004 and July 2015  RBBB Chronic  Abnormal CXR Chronic interstitial markings- Nov 2016 Seen by Dr Melvyn Novas Jan 2017 for cough, he felt her symptoms were secondary to GERD   PLAN  I suggested we proceed with an echo and have Dr Claiborne Billings see her after this.   Erlene Quan PA-C 05/20/2015 4:47 PM

## 2015-05-20 NOTE — Assessment & Plan Note (Signed)
Pt complains of vague, non exertional SOB

## 2015-05-20 NOTE — Patient Instructions (Signed)
Medication Instructions: Please continue your medications as listed.  Labwork: NONE  Testing/Procedures: 1. Echocardiogram - Your physician has requested that you have an echocardiogram. Echocardiography is a painless test that uses sound waves to create images of your heart. It provides your doctor with information about the size and shape of your heart and how well your heart's chambers and valves are working. This procedure takes approximately one hour. There are no restrictions for this procedure.  Follow-up: Kerin Ransom, PA-C, recommends that you schedule a follow-up appointment NEXT WEEK WITH DR Claiborne Billings.  If you need a refill on your cardiac medications before your next appointment, please call your pharmacy.

## 2015-05-20 NOTE — Assessment & Plan Note (Signed)
Chronic interstitial markings- Nov 2016 Seen by Dr Melvyn Novas Jan 2017 for cough, he felt her symptoms were secondary to GERD

## 2015-05-20 NOTE — Assessment & Plan Note (Signed)
2004 and July 2015

## 2015-05-20 NOTE — Assessment & Plan Note (Signed)
Pt complains of rest chest discomfort- "lasts seconds"

## 2015-05-25 DIAGNOSIS — K5792 Diverticulitis of intestine, part unspecified, without perforation or abscess without bleeding: Secondary | ICD-10-CM | POA: Diagnosis not present

## 2015-05-25 DIAGNOSIS — R339 Retention of urine, unspecified: Secondary | ICD-10-CM | POA: Diagnosis not present

## 2015-05-25 DIAGNOSIS — R1032 Left lower quadrant pain: Secondary | ICD-10-CM | POA: Diagnosis not present

## 2015-05-30 ENCOUNTER — Ambulatory Visit (HOSPITAL_COMMUNITY): Payer: Medicare Other | Attending: Cardiovascular Disease

## 2015-05-30 ENCOUNTER — Other Ambulatory Visit: Payer: Self-pay

## 2015-05-30 DIAGNOSIS — I340 Nonrheumatic mitral (valve) insufficiency: Secondary | ICD-10-CM | POA: Insufficient documentation

## 2015-05-30 DIAGNOSIS — I351 Nonrheumatic aortic (valve) insufficiency: Secondary | ICD-10-CM | POA: Diagnosis not present

## 2015-05-30 DIAGNOSIS — R0602 Shortness of breath: Secondary | ICD-10-CM | POA: Diagnosis not present

## 2015-05-30 DIAGNOSIS — R0600 Dyspnea, unspecified: Secondary | ICD-10-CM | POA: Diagnosis present

## 2015-05-31 ENCOUNTER — Ambulatory Visit: Payer: Medicare Other | Admitting: Cardiovascular Disease

## 2015-06-03 ENCOUNTER — Telehealth: Payer: Self-pay | Admitting: Cardiovascular Disease

## 2015-06-03 NOTE — Telephone Encounter (Signed)
Returned call, advised pt of normal echo results.

## 2015-06-03 NOTE — Telephone Encounter (Signed)
New message ° ° ° ° ° °Returning a call to the nurse to get test results °

## 2015-06-05 ENCOUNTER — Other Ambulatory Visit: Payer: Self-pay | Admitting: Family

## 2015-06-11 ENCOUNTER — Encounter: Payer: Self-pay | Admitting: Internal Medicine

## 2015-06-11 ENCOUNTER — Encounter: Payer: Self-pay | Admitting: Cardiovascular Disease

## 2015-06-11 ENCOUNTER — Ambulatory Visit (INDEPENDENT_AMBULATORY_CARE_PROVIDER_SITE_OTHER): Payer: Medicare Other | Admitting: Cardiovascular Disease

## 2015-06-11 ENCOUNTER — Ambulatory Visit (INDEPENDENT_AMBULATORY_CARE_PROVIDER_SITE_OTHER): Payer: Medicare Other | Admitting: Internal Medicine

## 2015-06-11 ENCOUNTER — Ambulatory Visit: Payer: Medicare Other | Admitting: Internal Medicine

## 2015-06-11 ENCOUNTER — Telehealth: Payer: Self-pay | Admitting: Cardiovascular Disease

## 2015-06-11 VITALS — BP 140/76 | HR 58 | Ht 64.0 in | Wt 138.2 lb

## 2015-06-11 VITALS — BP 142/86 | HR 65 | Ht 64.0 in | Wt 148.9 lb

## 2015-06-11 DIAGNOSIS — M791 Myalgia, unspecified site: Secondary | ICD-10-CM

## 2015-06-11 DIAGNOSIS — Z79899 Other long term (current) drug therapy: Secondary | ICD-10-CM

## 2015-06-11 DIAGNOSIS — I872 Venous insufficiency (chronic) (peripheral): Secondary | ICD-10-CM

## 2015-06-11 DIAGNOSIS — IMO0001 Reserved for inherently not codable concepts without codable children: Secondary | ICD-10-CM

## 2015-06-11 DIAGNOSIS — R0602 Shortness of breath: Secondary | ICD-10-CM

## 2015-06-11 DIAGNOSIS — I252 Old myocardial infarction: Secondary | ICD-10-CM | POA: Diagnosis not present

## 2015-06-11 DIAGNOSIS — R0600 Dyspnea, unspecified: Secondary | ICD-10-CM

## 2015-06-11 DIAGNOSIS — R05 Cough: Secondary | ICD-10-CM | POA: Diagnosis not present

## 2015-06-11 DIAGNOSIS — Z0389 Encounter for observation for other suspected diseases and conditions ruled out: Secondary | ICD-10-CM

## 2015-06-11 DIAGNOSIS — I4510 Unspecified right bundle-branch block: Secondary | ICD-10-CM

## 2015-06-11 DIAGNOSIS — R058 Other specified cough: Secondary | ICD-10-CM

## 2015-06-11 MED ORDER — POTASSIUM CHLORIDE CRYS ER 20 MEQ PO TBCR: 40.0000 meq | EXTENDED_RELEASE_TABLET | Freq: Every day | ORAL | Status: DC

## 2015-06-11 MED ORDER — FUROSEMIDE 40 MG PO TABS: 20.0000 mg | ORAL_TABLET | Freq: Every day | ORAL | Status: DC

## 2015-06-11 MED ORDER — ASPIRIN EC 81 MG PO TBEC: 81.0000 mg | DELAYED_RELEASE_TABLET | Freq: Every day | ORAL | Status: DC

## 2015-06-11 NOTE — Patient Instructions (Signed)
At the onset of cough for any reason I recommend  For cough >>  Delsym cough syrup   For drainage > For drainage / throat tickle try take CHLORPHENIRAMINE  4 mg - take one every 4 hours as needed - available over the counter- may cause drowsiness so start with just a bedtime dose or two and see how you tolerate it before trying in daytime    Try prilosec otc 20mg   Take 30-60 min before first meal of the day and Pepcid ac (famotidine) 20 mg one @  bedtime until cough is completely gone for at least a week without the need for cough suppression  GERD (REFLUX)  is an extremely common cause of respiratory symptoms just like yours , many times with no obvious heartburn at all.    It can be treated with medication, but also with lifestyle changes including elevation of the head of your bed (ideally with 6 inch  bed blocks),  Smoking cessation, avoidance of late meals, excessive alcohol, and avoid fatty foods, chocolate, peppermint, colas, red wine, and acidic juices such as orange juice.  NO MINT OR MENTHOL PRODUCTS SO NO COUGH DROPS  USE SUGARLESS CANDY INSTEAD (Jolley ranchers or Stover's or Life Savers) or even ice chips will also do - the key is to swallow to prevent all throat clearing. NO OIL BASED VITAMINS - use powdered substitutes.   For any cough lasting more than 3 weeks please return to office

## 2015-06-11 NOTE — Telephone Encounter (Signed)
Pt wanted to addend to discussion at [place] today, regarding the described "fogginess" she stated, she notes this is alleviated after she takes her furosemide and potassium & voids. Routed to Dr. Claiborne Billings for any further considerations.

## 2015-06-11 NOTE — Progress Notes (Signed)
Subjective:     Patient ID: Alexandria [name], female   DOB: 1945/11/26     MRN: [id number]     Brief patient profile:  41 yowf  singer quit smoking 1971 (only socially) then on trip back from [facility] October 2016 flew back next to sick passenger and started coughing p back in [place] sev days later  and could not stop so referred 02/14/2015 to pulmonary clinic by Dr Susa Loffler.    History of Present Illness  02/14/2015 1st [facility] office visit/ Wert   Chief Complaint  Patient presents with  . Pulmonary Consult    Pt c/o dry cough with wheeze x 2weeks. Pt has been using albuterol HFA with some symptom relief.   some better after nebulizer in office and prednisone rx but only maybe 25% at most  Worse with voice use / not productive / only sob when coughing/ not present at hs  No better on qvar  rec Depomedrol 120 mg IM today  For cough > use robitussin with codeine and the flutter valve as much as you can  For breathing > ok to use albuterol up to 2 pffs and leave off qvar for now Pantoprazole (protonix) 40 mg   Take  30-60 min before first meal of the day and Pepcid (famotidine)  20 mg one @  bedtime until return to office - this is the best way to tell whether stomach acid is contributing to your problem.  GERD (REFLUX) diet     03/05/2015  f/u ov/Wert re: 85% improvement in acute cough  Chief Complaint  Patient presents with  . Follow-up    Pt states her cough has improved some. She has no new co's today. She uses albuterol once per night on average.   using saba 4/7 nights at bedtime   ? Why rec Try off the albuterol first and if can't live without it call me at [phone] to arrange a methacholine challenge test  Try take CHLORPHENIRAMINE  4 mg -  1-2 tablets at bedtime in addition to pepcid 20 mg at bedtime     04/16/2015  f/u ov/Wert re: cough since oct 2016   Chief Complaint  Patient presents with  . Follow-up    Pt states cough is much improved since last  visit. She has not needed albuterol. No new co's today.   off all rx / singing ok  - Not limited by breathing from desired activities   rec At the onset of cough for any reason I recommend Delsym cough syrup  Try prilosec otc 20mg   Take 30-60 min before first meal of the day and Pepcid ac (famotidine) 20 mg one @  bedtime until cough is completely gone for at least a week without the need for cough suppression GERD diet  For any cough lasting more than 3 weeks please return to office    06/11/2015  f/u ov/Wert re: recurrent cough/ did not activate any part of the above contingency  Chief Complaint  Patient presents with  . Follow-up    Pt states that her cough has improved some. She is hoarse and has to clear her throat often.   onset x 10 days cough worse singing, worse daytime, not worse sleeping     No obvious day to day or daytime variability or assoc sob or excess/ purulent sputum or mucus plugs  cp or chest tightness, subjective wheeze or overt sinus or hb symptoms. No unusual exp hx or  h/o childhood pna/ asthma or knowledge of premature birth.  Sleeping ok without nocturnal  or early am exacerbation  of respiratory  c/o's or need for noct saba. Also denies any obvious fluctuation of symptoms with weather or environmental changes or other aggravating or alleviating factors except as outlined above   Current Medications, Allergies, Complete Past Medical History, Past Surgical History, Family History, and Social History were reviewed in [employer] record.  ROS  The following are not active complaints unless bolded sore throat, dysphagia, dental problems, itching, sneezing,  nasal congestion or excess/ purulent secretions, ear ache,   fever, chills, sweats, unintended wt loss, classically pleuritic or exertional cp, hemoptysis,  orthopnea pnd or leg swelling, presyncope, palpitations, abdominal pain, anorexia, nausea, vomiting, diarrhea  or change in bowel or bladder  habits, change in stools or urine, dysuria,hematuria,  rash, arthralgias, visual complaints, headache, numbness, weakness or ataxia or problems with walking or coordination,  change in mood/affect or memory.             Objective:   Physical Exam   amb  wf nad   06/11/2015          138  04/16/2015        138   03/05/2015     140   02/14/15 63.866 kg (140 lb 12.8 oz)  02/11/15 63.957 kg (141 lb)  02/06/15 63.107 kg (139 lb 2 oz)    Vital signs reviewed   HEENT: nl dentition, turbinates, and oropharynx. Nl external ear canals without cough reflex   NECK :  without JVD/Nodes/TM/ nl carotid upstrokes bilaterally   LUNGS: no acc muscle use, clear to A and P bilaterally without cough on insp or exp maneuvers   CV:  RRR  no s3 or murmur or increase in P2, no edema   ABD:  soft and nontender with nl excursion in the supine position. No bruits or organomegaly, bowel sounds nl  MS:  warm without deformities, calf tenderness, cyanosis or clubbing  SKIN: warm and dry without lesions    NEURO:  alert, approp, no deficits     I personally reviewed images and agree with radiology impression as follows:  CXR:   02/11/15 Chronically increased pulmonary interstitial markings are unchanged since February 01, 2015, but are slightly more conspicuous than on the study of September 2015. There is no alveolar pneumonia nor pulmonary edema. The findings may reflect acute bronchitic changes superimposed upon chronic pulmonary fibrosis.        Assessment:

## 2015-06-11 NOTE — Assessment & Plan Note (Signed)
rx max gerd 02/24/15 and d/c qvar > 85% improved, still some pm coughing > rec add 1st gen H1 at hs > resolved, try off all rx 04/16/2015  - flared mid Feb 2017  and did not resume rx as rec> 06/11/2015 suggested she resume gerd/h1 and f/u prn with allergy /asthma w/u next on cough  algorithm per guidelines     I had an extended discussion with the patient reviewing all relevant studies completed to date and  lasting 15 to 20 minutes of a 25 minute visit    Each maintenance medication was reviewed in detail including most importantly the difference between maintenance and prns and under what circumstances the prns are to be triggered using an action plan format that is not reflected in the computer generated alphabetically organized AVS.    Please see instructions for details which were reviewed in writing and the patient given a copy highlighting the part that I personally wrote and discussed at today's ov.

## 2015-06-11 NOTE — Telephone Encounter (Signed)
New message      Pt was just seen.  She forgot to tell the doctor that when she take furosemide and potassium together -----once she starts peeing, her "foggyness" in her head clears up.  Not sure why that happens, but it does.

## 2015-06-11 NOTE — Patient Instructions (Signed)
Your physician has recommended you make the following change in your medication:   1.) the furosemide has been decreased to 20 mg daily.  2.) the potassium has been decreased to 2 tablets daily.  3.) STOP the plavix ( clopidogrel) and start aspirin 81 mg daily.  Your physician recommends that you return for lab work fasting.  Your physician wants you to follow-up in: 6 months or sooner if needed. You will receive a reminder letter in the mail two months in advance. If you don't receive a letter, please call our office to schedule the follow-up appointment.  If you need a refill on your cardiac medications before your next appointment, please call your pharmacy.

## 2015-06-12 DIAGNOSIS — R0602 Shortness of breath: Secondary | ICD-10-CM | POA: Diagnosis not present

## 2015-06-12 DIAGNOSIS — Z79899 Other long term (current) drug therapy: Secondary | ICD-10-CM | POA: Diagnosis not present

## 2015-06-12 DIAGNOSIS — M791 Myalgia: Secondary | ICD-10-CM | POA: Diagnosis not present

## 2015-06-12 DIAGNOSIS — I252 Old myocardial infarction: Secondary | ICD-10-CM | POA: Diagnosis not present

## 2015-06-12 LAB — MAGNESIUM: MAGNESIUM: 2.1 mg/dL (ref 1.5–2.5)

## 2015-06-12 LAB — LIPID PANEL
CHOL/HDL RATIO: 2.5 ratio (ref <=5.0)
CHOLESTEROL: 170 mg/dL (ref 125–200)
HDL: 68 mg/dL (ref >=46)
LDL Cholesterol: 92 mg/dL (ref <130)
TRIGLYCERIDES: 48 mg/dL (ref <150)
VLDL: 10 mg/dL (ref <30)

## 2015-06-12 LAB — BASIC METABOLIC PANEL
BUN: 11 mg/dL (ref 7–25)
CHLORIDE: 103 mmol/L (ref 98–110)
CO2: 31 mmol/L (ref 20–31)
CREATININE: 0.74 mg/dL (ref 0.60–0.93)
Calcium: 9 mg/dL (ref 8.6–10.4)
Glucose, Bld: 78 mg/dL (ref 65–99)
POTASSIUM: 4.3 mmol/L (ref 3.5–5.3)
Sodium: 139 mmol/L (ref 135–146)

## 2015-06-12 LAB — CK: CK TOTAL: 45 U/L (ref 7–177)

## 2015-06-13 ENCOUNTER — Telehealth: Payer: Self-pay | Admitting: Family Medicine

## 2015-06-13 LAB — SEDIMENTATION RATE: Sed Rate: 10 mm/hr (ref 0–30)

## 2015-06-13 NOTE — Telephone Encounter (Signed)
It was sent on 06/11/15 by Evalyn Casco.     KP

## 2015-06-13 NOTE — Progress Notes (Signed)
Patient ID: Alexandria [name], female   DOB: Aug 05, 1945, 70 y.o.   MRN: [id number]     HPI: Alexandria [name] is a 70 y.o. female who presents to the office today for a  follow up cardiology evaluation.  I have not seen her since her heart catheterization in July 2015.  Mr. Aron Baba underwent initial cardiac catheterization in 2004.  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  She had done well on medical therapy.  She has a remote history of TIA for which she has been on aspirin and Plavix.  In 2015, she was complaining of increasing shortness of breath.  A nuclear studies was intermediate risk and suggested possible septal and anterolateral ischemia.  Repeat cardiac catheterization was done on 10/25/2013 which again showed normal coronary arteries.  Her LV function was now normal with resolution of her previous apical wall motion abnormality.  Ejection fraction was 60-65%.  She had been doing fairly well.  She recently was seen by Kerin Ransom last month and had some atypical chest pain.  She also admitted to some chronic edema secondary to venous insufficiency.  She complained of nonexertional shortness of breath.  She underwent an echo Doppler study 01/27/2015 which showed an EF of 55-60% with normal wall motion.  There was grade 1 diastolic dysfunction.  Was mild MR and mild AR.  She presents for evaluation.  She also has a history of hypothyroidism.  She tries to walk every day.  There is a history of diverticular disease.  Past Medical History  Diagnosis Date  . Arthritis   . Diverticulitis   . Migraines   . DVT (deep venous thrombosis) ([place])     lower extremity  . Colon polyp   . Thyroid disease   . TIA (transient ischemic attack)     8 years ago  . Heart murmur   . H/O cardiac catheterization 2004    Normal coronary  arteries  . Hx of echocardiogram 01/27/2010    Normal Ef 55% the transmitral spectral doppler flow pattern is normal for age. the left ventricular wall motion is normal  . History of stress test 05/21/2011  . Asthmatic bronchitis with acute exacerbation 02/06/2015  . Heart attack ([place])     10 years ago    Past Surgical History  Procedure Laterality Date  . Vaginal hysterectomy  10/17/1998    Dory Horn  . Breast biopsy      Isaiah Blakes  . Total knee arthroplasty      Bilateral x's 2  . Neck surgery      x's 2  . Cardiac catheterization      10/2013  . Left heart catheterization with coronary angiogram N/A 10/25/2013    Procedure: LEFT HEART CATHETERIZATION WITH CORONARY ANGIOGRAM;  Surgeon: Troy Sine, MD;  [facility] C[facility] LAB;  Service: Cardiovascular;  Laterality: N/A;    Allergies  Allergen Reactions  . Prednisone Other (See Comments)    Homicidal  . Atrovent Hfa [Ipratropium Bromide Hfa] Other (See Comments)    Pt could not sleep  . Cheratussin Ac [Guaifenesin-Codeine]   . Hydrocodone Itching  . Influenza Vaccines Other (See Comments)    Pt reports heart attack after last flu shot  . Motrin [Ibuprofen] Other (See Comments)    "Gives false reading in blood"    Current Outpatient Prescriptions  Medication Sig Dispense Refill  . Ascorbic Acid (VITAMIN C PO) Take 2 tablets by mouth daily.     . Biotin (RA BIOTIN) 1000 MCG tablet Take 1,000 mcg by mouth 2 (two) times daily.     Marland Kitchen BREWERS YEAST PO Take 2 tablets by mouth 2 (two) times daily.     . ciprofloxacin (CIPRO) 750 MG tablet Take 1 tablet by mouth 2 (two) times daily. Reported on 06/11/2015  0  . dicyclomine (BENTYL) 20 MG tablet TAKE 1 TABLET BY MOUTH 4 TIMES A DAY AS NEEDED 360 tablet 1  . estradiol (ESTRACE) 2 MG tablet Take 2 mg by mouth daily.  11  . furosemide (LASIX) 40 MG tablet Take 0.5 tablets (20 mg total) by mouth daily. 60 tablet 3  . Multiple Vitamin (MULTIVITAMIN) tablet Take 1 tablet by mouth daily.      . potassium chloride SA (K-DUR,KLOR-CON) 20 MEQ tablet Take 2 tablets (40 mEq total) by mouth daily. 180 tablet 2  . Respiratory Therapy Supplies (FLUTTER) DEVI Use as directed 1 each 0  . SYNTHROID 112 MCG tablet TAKE 1 TABLET (112 MCG TOTAL) BY MOUTH DAILY BEFORE BREAKFAST. 90 tablet 1  . vitamin D, CHOLECALCIFEROL, 400 UNITS tablet Take 400 Units by mouth 2 (two) times daily.     Marland Kitchen aspirin EC 81 MG tablet Take 1 tablet (81 mg total) by mouth daily. (Patient not taking: Reported on 06/11/2015) 90 tablet 3   No current facility-administered medications for this visit.    Social History   Social History  . Marital Status: Married    Spouse Name: N/A  . Number of Children: 3  . Years of Education: N/A   Occupational History  . DISABLED    Social History Main Topics  . Smoking status: Former Smoker -- 2 years    Quit date: 04/06/1969  . Smokeless tobacco: Never Used     Comment: socially smoked x 2 years  . Alcohol Use: No  . Drug Use: No  . Sexual Activity: Not on file   Other Topics Concern  . Not on file   Social History Narrative    Family History  Problem Relation Age of Onset  . Arthritis    . Colon cancer Father   . HIV Brother   . Kidney cancer Brother   . Colon cancer Brother   . Lung cancer Brother   . Other Brother     Mouth Cancer  . Ovarian cancer Mother   . Uterine cancer Mother   . Prostate cancer Father   . Heart disease Maternal Grandmother   . Stroke Maternal Grandmother   . Hypertension Maternal Grandmother   . Diabetes Maternal Grandmother     ROS General: Negative; No fevers, chills, or night sweats HEENT: Negative; No changes in vision or hearing, sinus congestion, difficulty swallowing Pulmonary: Negative; No cough, wheezing, shortness of breath, hemoptysis Cardiovascular: See HPI: No chest pain, presyncope, syncope, palpatations GI: N positive for diverticular disease. GU: Negative; No dysuria, hematuria, or difficulty  voiding Musculoskeletal: Negative; no myalgias, joint pain, or weakness Hematologic: Negative; no easy bruising, bleeding Endocrine: Negative; no heat/cold intolerance; no diabetes, Neuro: Remote TIA Skin: Negative; No rashes or skin lesions Psychiatric: Negative; No behavioral problems, depression Sleep: Negative; No snoring,  daytime sleepiness, hypersomnolence, bruxism, restless legs, hypnogognic hallucinations. Other comprehensive 14 point system review is negative   Physical Exam BP 142/86 mmHg  Pulse 65  Ht '5\' 4"'$  (1.626 m)  Wt 148 lb 14.4  oz (67.541 kg)  BMI 25.55 kg/m2 Wt Readings from Last 3 Encounters:  06/11/15 138 lb 3.2 oz (62.687 kg)  06/11/15 148 lb 14.4 oz (67.541 kg)  05/20/15 136 lb 1.6 oz (61.735 kg)   General: Alert, oriented, no distress.  Skin: normal turgor, no rashes, warm and dry HEENT: Normocephalic, atraumatic. Pupils equal round and reactive to light; sclera anicteric; extraocular muscles intact, No lid lag; Nose without nasal septal hypertrophy; Mouth/Parynx benign; Mallinpatti scale 2 Neck: No JVD, no carotid bruits; normal carotid upstroke Lungs: clear to ausculatation and percussion bilaterally; no wheezing or rales, normal inspiratory and expiratory effort Chest wall: without tenderness to palpitation Heart: PMI not displaced, RRR, s1 s2 normal, 1/6 systolic murmur, No diastolic murmur, no rubs, gallops, thrills, or heaves Abdomen: soft, nontender; no hepatosplenomehaly, BS+; abdominal aorta nontender and not dilated by palpation. Back: no CVA tenderness Pulses: 2+  Musculoskeletal: full range of motion, normal strength, no joint deformities Extremities: Pulses 2+, no clubbing cyanosis or edema, Homan's sign negative  Neurologic: grossly nonfocal; Cranial nerves grossly wnl Psychologic: Normal mood and affect   ECG (independently read by me): Normal sinus rhythm at 65 bpm with right bundle branch block and repolarization changes.  LABS:  BMP  Latest Ref Rng 06/11/2015 05/07/2015 12/20/2014  Glucose 65 - 99 mg/dL 78 92 83  BUN 7 - 25 mg/dL '11 13 18  '$ Creatinine 0.60 - 0.93 mg/dL 0.74 0.81 1.09  Sodium 135 - 146 mmol/L 139 141 136  Potassium 3.5 - 5.3 mmol/L 4.3 3.2(L) 3.9  Chloride 98 - 110 mmol/L 103 96 99  CO2 20 - 31 mmol/L 31 35(H) 30  Calcium 8.6 - 10.4 mg/dL 9.0 9.5 9.1     Hepatic Function Latest Ref Rng 05/07/2015 12/20/2014 10/25/2014  Total Protein 6.0 - 8.3 g/dL 7.7 7.0 6.8  Albumin 3.5 - 5.2 g/dL 4.2 3.8 3.8  AST 0 - 37 U/L '24 22 21  '$ ALT 0 - 35 U/L '20 14 17  '$ Alk Phosphatase 39 - 117 U/L 75 51 61  Total Bilirubin 0.2 - 1.2 mg/dL 0.5 0.3 0.5  Bilirubin, Direct 0.0 - 0.3 mg/dL - - 0.1    CBC Latest Ref Rng 05/07/2015 12/20/2014 10/25/2014  WBC 4.0 - 10.5 K/uL 6.5 3.5(L) 4.9  Hemoglobin 12.0 - 15.0 g/dL 14.8 13.4 13.7  Hematocrit 36.0 - 46.0 % 44.3 39.1 40.1  Platelets 150.0 - 400.0 K/uL 247.0 220.0 180.0   Lab Results  Component Value Date   MCV 100.5* 05/07/2015   MCV 95.8 12/20/2014   MCV 96.1 10/25/2014    Lab Results  Component Value Date   TSH 0.60 05/07/2015    BNP No results found for: BNP  ProBNP No results found for: PROBNP   Lipid Panel     Component Value Date/Time   CHOL 170 06/11/2015 1002   TRIG 48 06/11/2015 1002   HDL 68 06/11/2015 1002   CHOLHDL 2.5 06/11/2015 1002   VLDL 10 06/11/2015 1002   LDLCALC 92 06/11/2015 1002     RADIOLOGY: No results found.    ASSESSMENT AND PLAN: Mr. 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  At her last catheterization.  Her wall motion abnormality had resolved and she continues to have normal coronary arteries.  I reviewed her most recent echo Doppler study with her in detail.  Ejection fraction is normal.  There was mild grade 1 diastolic dysfunction.  She has been taking  Lasix 40 mg twice a day.  I suggested she reduce this to just 20 mg daily and reduce her  supplemental potassium.  I am recommending that she discontinue Plavix and will start her on aspirin 81 mg.  She has complained of sore muscles.  I will check a be met, magnesium level, sedimentation rate, CPK level, as well as a fasting lipid panel.  I will contact her regarding the results if abnormal.  I will see her in 6 months for reevaluation.  Time spent: 25 minutes Troy Sine, MD, [facility]  06/13/2015 9:21 PM

## 2015-06-13 NOTE — Telephone Encounter (Signed)
[id number] - [place], [place] - [address] AT [place] [phone] (Phone) [phone] (Fax)        Reason for call:  Pharmacy called on patient behalf, patient requesting 180 tablets with 3 refills for furosemide (LASIX) 40 MG tablet

## 2015-06-18 ENCOUNTER — Telehealth: Payer: Self-pay | Admitting: Cardiovascular Disease

## 2015-06-18 ENCOUNTER — Encounter: Payer: Self-pay | Admitting: Cardiovascular Disease

## 2015-06-18 NOTE — Telephone Encounter (Signed)
acknowledged

## 2015-06-18 NOTE — Telephone Encounter (Signed)
Alexandria [name] is calling to get her lab results .Marland Kitchen Please Call  Thanks

## 2015-06-18 NOTE — Telephone Encounter (Signed)
Attempted to return call to patient - VM box full 

## 2015-06-18 NOTE — Telephone Encounter (Signed)
Patient called and notified of results. Copy of labs mailed per her request.

## 2015-07-22 ENCOUNTER — Telehealth: Payer: Self-pay | Admitting: Family Medicine

## 2015-07-22 DIAGNOSIS — K5730 Diverticulosis of large intestine without perforation or abscess without bleeding: Secondary | ICD-10-CM | POA: Diagnosis not present

## 2015-07-22 DIAGNOSIS — R1032 Left lower quadrant pain: Secondary | ICD-10-CM | POA: Diagnosis not present

## 2015-07-22 DIAGNOSIS — K5732 Diverticulitis of large intestine without perforation or abscess without bleeding: Secondary | ICD-10-CM | POA: Diagnosis not present

## 2015-07-22 NOTE — Telephone Encounter (Signed)
Pt called in because she would like to know the name of the location that she was referred to for a colonoscopy.    Please advise. I will call pt back for CMA.    CB: [phone]

## 2015-07-22 NOTE — Telephone Encounter (Signed)
Dr.Mishra at [place].   KP

## 2015-07-23 NOTE — Telephone Encounter (Signed)
That is the name on it, she can call the number below for more details.   KP

## 2015-07-23 NOTE — Telephone Encounter (Signed)
Pt says that the provider below completed her study. Pt says that she need to know that providers name that actually completed her colonoscopy.

## 2015-08-12 NOTE — Telephone Encounter (Signed)
Spoke with patient and I reviewed her chart, I made her aware the she had the colonoscopy at [facility] because they could not complete it at [place], she said she did not remember all of that, I advised she can come to the office to pick up a copy. She verbalized understanding and  has agreed to do so. I left notes and reports from both [place] and [place]

## 2015-08-12 NOTE — Telephone Encounter (Signed)
Caller name: Kennyth Lose Relationship to patient: self Can be reached: [phone]  Reason for call: Pt called to f/u on GI information. She said that 3 weeks ago today she called and did not get the correct information. She was told she'd get another call back and did not. Pt states she walked in to [facility] the same day in the morning because Dr. Etter Sjogren did not have availability. Pt said that she was DX with diverticulitis again and was recommended to have a CT scan. She said that she was waiting to hear from [place] out of respect for Dr. Etter Sjogren. I looked thru records in pt chart and determined she saw Dr. Anson Fret at [place]. She is asking if Dr. Etter Sjogren would recommend she f/u with him again. If so, please enter a referral. Pt would like a call back with recommendations asap.

## 2015-08-12 NOTE — Telephone Encounter (Signed)
Message left to call the office.    KP 

## 2015-08-20 ENCOUNTER — Encounter: Payer: Self-pay | Admitting: Family Medicine

## 2015-08-20 ENCOUNTER — Ambulatory Visit (INDEPENDENT_AMBULATORY_CARE_PROVIDER_SITE_OTHER): Payer: Medicare Other | Admitting: Family Medicine

## 2015-08-20 VITALS — BP 110/70 | HR 70 | Temp 97.9°F | Ht 64.0 in | Wt 136.4 lb

## 2015-08-20 DIAGNOSIS — K5732 Diverticulitis of large intestine without perforation or abscess without bleeding: Secondary | ICD-10-CM | POA: Diagnosis not present

## 2015-08-20 MED ORDER — AMOXICILLIN-POT CLAVULANATE 875-125 MG PO TABS: 1.0000 | ORAL_TABLET | Freq: Two times a day (BID) | ORAL | Status: DC

## 2015-08-20 NOTE — Patient Instructions (Signed)
Diverticulitis °Diverticulitis is inflammation or infection of small pouches in your colon that form when you have a condition called diverticulosis. The pouches in your colon are called diverticula. Your colon, or large intestine, is where water is absorbed and stool is formed. °Complications of diverticulitis can include: °· Bleeding. °· Severe infection. °· Severe pain. °· Perforation of your colon. °· Obstruction of your colon. °CAUSES  °Diverticulitis is caused by bacteria. °Diverticulitis happens when stool becomes trapped in diverticula. This allows bacteria to grow in the diverticula, which can lead to inflammation and infection. °RISK FACTORS °People with diverticulosis are at risk for diverticulitis. Eating a diet that does not include enough fiber from fruits and vegetables may make diverticulitis more likely to develop. °SYMPTOMS  °Symptoms of diverticulitis may include: °· Abdominal pain and tenderness. The pain is normally located on the left side of the abdomen, but may occur in other areas. °· Fever and chills. °· Bloating. °· Cramping. °· Nausea. °· Vomiting. °· Constipation. °· Diarrhea. °· Blood in your stool. °DIAGNOSIS  °Your health care provider will ask you about your medical history and do a physical exam. You may need to have tests done because many medical conditions can cause the same symptoms as diverticulitis. Tests may include: °· Blood tests. °· Urine tests. °· Imaging tests of the abdomen, including X-rays and CT scans. °When your condition is under control, your health care provider may recommend that you have a colonoscopy. A colonoscopy can show how severe your diverticula are and whether something else is causing your symptoms. °TREATMENT  °Most cases of diverticulitis are mild and can be treated at home. Treatment may include: °· Taking over-the-counter pain medicines. °· Following a clear liquid diet. °· Taking antibiotic medicines by mouth for 7-10 days. °More severe cases may  be treated at a hospital. Treatment may include: °· Not eating or drinking. °· Taking prescription pain medicine. °· Receiving antibiotic medicines through an IV tube. °· Receiving fluids and nutrition through an IV tube. °· Surgery. °HOME CARE INSTRUCTIONS  °· Follow your health care provider's instructions carefully. °· Follow a full liquid diet or other diet as directed by your health care provider. After your symptoms improve, your health care provider may tell you to change your diet. He or she may recommend you eat a high-fiber diet. Fruits and vegetables are good sources of fiber. Fiber makes it easier to pass stool. °· Take fiber supplements or probiotics as directed by your health care provider. °· Only take medicines as directed by your health care provider. °· Keep all your follow-up appointments. °SEEK MEDICAL CARE IF:  °· Your pain does not improve. °· You have a hard time eating food. °· Your bowel movements do not return to normal. °SEEK IMMEDIATE MEDICAL CARE IF:  °· Your pain becomes worse. °· Your symptoms do not get better. °· Your symptoms suddenly get worse. °· You have a fever. °· You have repeated vomiting. °· You have bloody or black, tarry stools. °MAKE SURE YOU:  °· Understand these instructions. °· Will watch your condition. °· Will get help right away if you are not doing well or get worse. °  °This information is not intended to replace advice given to you by your health care provider. Make sure you discuss any questions you have with your health care provider. °  °Document Released: 12/31/2004 Document Revised: 03/28/2013 Document Reviewed: 02/15/2013 °Elsevier Interactive Patient Education ©2016 [employer] ° °

## 2015-08-20 NOTE — Progress Notes (Signed)
Patient ID: Alexandria [name], female    DOB: [birth year]  Age: 70 y.o. MRN: [id number]    Subjective:  Subjective HPI Alexandria [name] presents for f/u diverticulitis.  Pt was seen at urgent care and tx with avelox.   She feels no better-- that was almost 1 month ago.  Pt is also waiting for gi referral.  Review of Systems  Constitutional: Negative for diaphoresis, appetite change, fatigue and unexpected weight change.  Eyes: Negative for pain, redness and visual disturbance.  Respiratory: Negative for cough, chest tightness, shortness of breath and wheezing.   Cardiovascular: Negative for chest pain, palpitations and leg swelling.  Gastrointestinal: Positive for abdominal pain and diarrhea.  Endocrine: Negative for cold intolerance, heat intolerance, polydipsia, polyphagia and polyuria.  Genitourinary: Negative for dysuria, frequency and difficulty urinating.  Neurological: Negative for dizziness, light-headedness, numbness and headaches.    History Past Medical History  Diagnosis Date  . Arthritis   . Diverticulitis   . Migraines   . DVT (deep venous thrombosis) ([place])     lower extremity  . Colon polyp   . Thyroid disease   . TIA (transient ischemic attack)     8 years ago  . Heart murmur   . H/O cardiac catheterization 2004    Normal coronary arteries  . Hx of echocardiogram 01/27/2010    Normal Ef 55% the transmitral spectral doppler flow pattern is normal for age. the left ventricular wall motion is normal  . History of stress test 05/21/2011  . Asthmatic bronchitis with acute exacerbation 02/06/2015  . Heart attack ([place])     10 years ago    She has past surgical history that includes Vaginal hysterectomy (10/17/1998); Breast biopsy; Total knee arthroplasty; Neck surgery; Cardiac catheterization; and left heart catheterization with coronary angiogram (N/A, 10/25/2013).   Her family history includes Colon cancer in her brother and father; Diabetes in her maternal  grandmother; HIV in her brother; Heart disease in her maternal grandmother; Hypertension in her maternal grandmother; Kidney cancer in her brother; Lung cancer in her brother; Other in her brother; Ovarian cancer in her mother; Prostate cancer in her father; Stroke in her maternal grandmother; Uterine cancer in her mother.She reports that she quit smoking about 46 years ago. She has never used smokeless tobacco. She reports that she does not drink alcohol or use illicit drugs.  Current Outpatient Prescriptions on File Prior to Visit  Medication Sig Dispense Refill  . Ascorbic Acid (VITAMIN C PO) Take 2 tablets by mouth daily.     . Biotin (RA BIOTIN) 1000 MCG tablet Take 1,000 mcg by mouth 2 (two) times daily.     Marland Kitchen BREWERS YEAST PO Take 2 tablets by mouth 2 (two) times daily.     Marland Kitchen dicyclomine (BENTYL) 20 MG tablet TAKE 1 TABLET BY MOUTH 4 TIMES A DAY AS NEEDED 360 tablet 1  . estradiol (ESTRACE) 2 MG tablet Take 2 mg by mouth daily.  11  . furosemide (LASIX) 40 MG tablet Take 0.5 tablets (20 mg total) by mouth daily. 60 tablet 3  . Multiple Vitamin (MULTIVITAMIN) tablet Take 1 tablet by mouth daily.    . potassium chloride SA (K-DUR,KLOR-CON) 20 MEQ tablet Take 2 tablets (40 mEq total) by mouth daily. 180 tablet 2  . Respiratory Therapy Supplies (FLUTTER) DEVI Use as directed 1 each 0  . SYNTHROID 112 MCG tablet TAKE 1 TABLET (112 MCG TOTAL) BY MOUTH DAILY BEFORE BREAKFAST. 90 tablet 1  . vitamin D, CHOLECALCIFEROL,  400 UNITS tablet Take 400 Units by mouth 2 (two) times daily.      No current facility-administered medications on file prior to visit.     Objective:  Objective Physical Exam  Constitutional: She is oriented to person, place, and time. She appears well-developed and well-nourished.  HENT:  Head: Normocephalic and atraumatic.  Eyes: Conjunctivae and EOM are normal.  Neck: Normal range of motion. Neck supple. No JVD present. Carotid bruit is not present. No thyromegaly present.   Cardiovascular: Normal rate, regular rhythm and normal heart sounds.   No murmur heard. Pulmonary/Chest: Effort normal and breath sounds normal. No respiratory distress. She has no wheezes. She has no rales. She exhibits no tenderness.  Abdominal: She exhibits no distension. There is tenderness.    Musculoskeletal: She exhibits no edema.  Neurological: She is alert and oriented to person, place, and time.  Psychiatric: She has a normal mood and affect. Her behavior is normal. Judgment and thought content normal.  Nursing note and vitals reviewed.  BP 110/70 mmHg  Pulse 70  Temp(Src) 97.9 F (36.6 C) (Oral)  Ht 5\' 4"  (1.626 m)  Wt 136 lb 6.4 oz (61.871 kg)  BMI 23.40 kg/m2  SpO2 98% Wt Readings from Last 3 Encounters:  08/20/15 136 lb 6.4 oz (61.871 kg)  06/11/15 138 lb 3.2 oz (62.687 kg)  06/11/15 148 lb 14.4 oz (67.541 kg)     Lab Results  Component Value Date   WBC 6.5 05/07/2015   HGB 14.8 05/07/2015   HCT 44.3 05/07/2015   PLT 247.0 05/07/2015   GLUCOSE 78 06/11/2015   CHOL 170 06/11/2015   TRIG 48 06/11/2015   HDL 68 06/11/2015   LDLCALC 92 06/11/2015   ALT 20 05/07/2015   AST 24 05/07/2015   NA 139 06/11/2015   K 4.3 06/11/2015   CL 103 06/11/2015   CREATININE 0.74 06/11/2015   BUN 11 06/11/2015   CO2 31 06/11/2015   TSH 0.60 05/07/2015   INR 1.1* 05/07/2015    Dg Chest 2 View  02/12/2015  CLINICAL DATA:  Two weeks of cough and wheezing, remote history of smoking. EXAM: CHEST  2 VIEW COMPARISON:  PA and lateral chest x-ray of February 01, 2015, and January 01, 2014. FINDINGS: The lungs are well-expanded. The interstitial markings remain increased bilaterally. There is no alveolar infiltrate. There is no pleural effusion. The heart and pulmonary vascularity are normal. The mediastinum is normal in width. There is stable S-shaped thoraco lumbar scoliosis. IMPRESSION: Chronically increased pulmonary interstitial markings are unchanged since February 01, 2015,  but are slightly more conspicuous than on the study of September 2015. There is no alveolar pneumonia nor pulmonary edema. The findings may reflect acute bronchitic changes superimposed upon chronic pulmonary fibrosis. Follow-up radiographs following anticipated antibiotic therapy would be useful. If the patient's symptoms persist despite therapy, high-resolution chest CT scanning may be useful. Electronically Signed   By: David  Martinique M.D.   On: 02/12/2015 08:16     Assessment & Plan:  Plan I have discontinued Ms. Klinker's ciprofloxacin and aspirin EC. I am also having her start on amoxicillin-clavulanate. Additionally, I am having her maintain her vitamin D (CHOLECALCIFEROL), Ascorbic Acid (VITAMIN C PO), BREWERS YEAST PO, multivitamin, Biotin, FLUTTER, dicyclomine, SYNTHROID, estradiol, furosemide, potassium chloride SA, and lactulose (encephalopathy).  Meds ordered this encounter  Medications  . lactulose, encephalopathy, (CHRONULAC) 10 GM/15ML SOLN    Sig: TAKE 30 MLS (20 G TOTAL) BY MOUTH EVERY 6 (SIX) HOURS AS NEEDED.  Refill:  1  . amoxicillin-clavulanate (AUGMENTIN) 875-125 MG tablet    Sig: Take 1 tablet by mouth 2 (two) times daily.    Dispense:  20 tablet    Refill:  0    Problem List Items Addressed This Visit    None    Visit Diagnoses    Diverticulitis of colon    -  Primary    Relevant Medications    amoxicillin-clavulanate (AUGMENTIN) 875-125 MG tablet    Other Relevant Orders    Ambulatory referral to Gastroenterology       Follow-up: Return if symptoms worsen or fail to improve.  [name], DO

## 2015-08-20 NOTE — Progress Notes (Signed)
Pre visit review using our clinic review tool, if applicable. No additional management support is needed unless otherwise documented below in the visit note. 

## 2015-08-22 ENCOUNTER — Encounter: Payer: Self-pay | Admitting: Gastroenterology

## 2015-08-22 ENCOUNTER — Ambulatory Visit (INDEPENDENT_AMBULATORY_CARE_PROVIDER_SITE_OTHER): Payer: Medicare Other | Admitting: Gastroenterology

## 2015-08-22 VITALS — BP 116/68 | HR 72 | Ht 63.19 in | Wt 133.5 lb

## 2015-08-22 DIAGNOSIS — K589 Irritable bowel syndrome without diarrhea: Secondary | ICD-10-CM | POA: Diagnosis not present

## 2015-08-22 DIAGNOSIS — K5730 Diverticulosis of large intestine without perforation or abscess without bleeding: Secondary | ICD-10-CM | POA: Diagnosis not present

## 2015-08-22 DIAGNOSIS — K5732 Diverticulitis of large intestine without perforation or abscess without bleeding: Secondary | ICD-10-CM

## 2015-08-22 NOTE — Patient Instructions (Addendum)
We will give you IBgard samples  VSL # 3 450 U one packet daily [place] given  Follow up in 3 months

## 2015-08-23 ENCOUNTER — Telehealth: Payer: Self-pay | Admitting: Gastroenterology

## 2015-08-23 NOTE — Telephone Encounter (Signed)
Took VSL#3 for the first time today. Has not felt well all day. Tried eating, but that did not help. She has not vomited. She will change to clear liquids, broth jello, ginger ale. Then she will progress to starches and foods included on the low fodmap food list.

## 2015-08-26 ENCOUNTER — Telehealth: Payer: Self-pay | Admitting: Gastroenterology

## 2015-08-26 NOTE — Telephone Encounter (Signed)
Today she would like to know what she could replace the VSL with. Please advise.

## 2015-08-26 NOTE — Progress Notes (Signed)
Alexandria [name]    [id number]    [birth year]  Primary Care Physician:Yvonne R Carollee Herter, DO  Referring Physician: [name], DO [address] [place], [place] [address]  Chief complaint:  LLQ abdominal pain, h/o diverticulitis HPI: 70 year-old female with history of severe L side diverticulosis and recent episode of diverticulitis that was treated with Cipro but with persistent LLQ abd pain is here for evaluation. Pateint had low grade fever before that has improved with antibiotics. She was given a prescriptions for Amoxicillin and Clavulanate by PMD but patient hasnt started it. Her last colonoscopy in 2014 at [facility] showed severe L sided diverticulosis with fixed segment of sigmoid colon. She has alternating constipation and diarrhea associated with severe bloating and cramping. No blood in stool. Reviewed labs from march 2017 with normal ESR and BMP. CT abdomen and pelvis with contrast in September 2016 showed mild acute diverticulitis in the sigmoid colon. Denies any nausea or vomiting. Weight is stable   Outpatient Encounter Prescriptions as of 08/22/2015  Medication Sig  . amoxicillin-clavulanate (AUGMENTIN) 875-125 MG tablet Take 1 tablet by mouth 2 (two) times daily.  . Ascorbic Acid (VITAMIN C PO) Take 2 tablets by mouth daily.   . Biotin (RA BIOTIN) 1000 MCG tablet Take 1,000 mcg by mouth 2 (two) times daily.   Marland Kitchen BREWERS YEAST PO Take 2 tablets by mouth 2 (two) times daily.   Marland Kitchen dicyclomine (BENTYL) 20 MG tablet TAKE 1 TABLET BY MOUTH 4 TIMES A DAY AS NEEDED  . estradiol (ESTRACE) 2 MG tablet Take 2 mg by mouth daily.  . furosemide (LASIX) 40 MG tablet Take 0.5 tablets (20 mg total) by mouth daily.  Marland Kitchen lactulose, encephalopathy, (CHRONULAC) 10 GM/15ML SOLN TAKE 30 MLS (20 G TOTAL) BY MOUTH EVERY 6 (SIX) HOURS AS NEEDED.  . Multiple Vitamin (MULTIVITAMIN) tablet Take 1 tablet by mouth daily.  . potassium chloride SA (K-DUR,KLOR-CON) 20 MEQ  tablet Take 2 tablets (40 mEq total) by mouth daily.  Marland Kitchen Respiratory Therapy Supplies (FLUTTER) DEVI Use as directed  . SYNTHROID 112 MCG tablet TAKE 1 TABLET (112 MCG TOTAL) BY MOUTH DAILY BEFORE BREAKFAST.  Marland Kitchen vitamin D, CHOLECALCIFEROL, 400 UNITS tablet Take 400 Units by mouth 2 (two) times daily.    No facility-administered encounter medications on file as of 08/22/2015.    Allergies as of 08/22/2015 - Review Complete 08/20/2015  Allergen Reaction Noted  . Prednisone Other (See Comments) 12/18/2011  . Atrovent hfa [ipratropium bromide hfa] Other (See Comments) 02/14/2015  . Cheratussin ac [guaifenesin-codeine]  01/29/2015  . Hydrocodone Itching 09/28/2012  . Influenza vaccines Other (See Comments) 01/30/2015  . Motrin [ibuprofen] Other (See Comments) 10/23/2013    Past Medical History  Diagnosis Date  . Arthritis   . Diverticulitis   . Migraines   . DVT (deep venous thrombosis) ([place])     lower extremity  . Colon polyp   . Thyroid disease   . TIA (transient ischemic attack)     8 years ago  . Heart murmur   . H/O cardiac catheterization 2004    Normal coronary arteries  . Hx of echocardiogram 01/27/2010    Normal Ef 55% the transmitral spectral doppler flow pattern is normal for age. the left ventricular wall motion is normal  . History of stress test 05/21/2011  . Asthmatic bronchitis with acute exacerbation 02/06/2015  . Heart attack ([place])  10 years ago    Past Surgical History  Procedure Laterality Date  . Vaginal hysterectomy  10/17/1998    Dory Horn  . Breast biopsy      Isaiah Blakes  . Total knee arthroplasty      Bilateral x's 2  . Neck surgery      x's 2  . Cardiac catheterization      10/2013  . Left heart catheterization with coronary angiogram N/A 10/25/2013    Procedure: LEFT HEART CATHETERIZATION WITH CORONARY ANGIOGRAM;  Surgeon: Troy Sine, MD;  Location: [facility] CATH LAB;  Service: Cardiovascular;  Laterality: N/A;    Family History  Problem Relation  Age of Onset  . Arthritis    . Colon cancer Father   . HIV Brother     75  . Kidney cancer Brother   . Colon cancer Brother   . Lung cancer Brother   . Other Brother     Mouth Cancer  . Ovarian cancer Mother   . Uterine cancer Mother   . Prostate cancer Father   . Heart disease Maternal Grandmother   . Stroke Maternal Grandmother   . Hypertension Maternal Grandmother   . Diabetes Maternal Grandmother     Social History   Social History  . Marital Status: Married    Spouse Name: N/A  . Number of Children: 3  . Years of Education: N/A   Occupational History  . DISABLED    Social History Main Topics  . Smoking status: Former Smoker -- 2 years    Quit date: 04/06/1969  . Smokeless tobacco: Never Used     Comment: socially smoked x 2 years  . Alcohol Use: No  . Drug Use: No  . Sexual Activity: Not on file   Other Topics Concern  . Not on file   Social History Narrative      Review of systems: Review of Systems  Constitutional: Negative for fever and chills.  HENT: Negative.   Eyes: Negative for blurred vision.  Respiratory: Negative for cough, shortness of breath and wheezing.   Cardiovascular: Negative for chest pain and palpitations.  Gastrointestinal: as per HPI Genitourinary: Negative for dysuria, urgency, frequency and hematuria.  Musculoskeletal: Negative for myalgias, back pain and joint pain.  Skin: Negative for itching and rash.  Neurological: Negative for dizziness, tremors, focal weakness, seizures and loss of consciousness.  Endo/Heme/Allergies: Negative for environmental allergies.  Psychiatric/Behavioral: Negative for depression, suicidal ideas and hallucinations.  All other systems reviewed and are negative.   Physical Exam: Filed Vitals:   08/22/15 0940  BP: 116/68  Pulse: 72   Gen:      No acute distress HEENT:  EOMI, sclera anicteric Neck:     No masses; no thyromegaly Lungs:    Clear to auscultation bilaterally; normal  respiratory effort CV:         Regular rate and rhythm; no murmurs Abd:      + bowel sounds; soft, non-tender; no palpable masses, no distension Ext:    No edema; adequate peripheral perfusion Skin:      Warm and dry; no rash Neuro: alert and oriented x 3 Psych: normal mood and affect  Data Reviewed:  Reviewed chart in epic   Assessment and Plan/Recommendations:  70 year old female with history of severe left-sided diverticulosis with a fixed segment of sigmoid colon and recurrent sigmoid diverticulitis here for evaluation She recently completed course of antibiotics and continues to have persistent bloating and irregular bowel movements associated with left  lower quadrant abdominal pain Advise patient to start low fodmap diet trial for 2 weeks to improve symptoms of IBS Also start probiotic VSL# 3 BID IB guard twice daily as needed We'll hold off antibiotics for now unless patient develops fever or worsening of symptoms Return in 3 months or sooner if needed   K. Denzil Magnuson , MD [phone] Mon-Fri 8a-5p [phone] after 5p, weekends, holidays  CC: Carollee Herter, Alferd Apa, *

## 2015-08-26 NOTE — Telephone Encounter (Signed)
Please have her continue low FODMAP diet. She can try Align instead

## 2015-08-26 NOTE — Telephone Encounter (Signed)
Advised 

## 2015-09-04 ENCOUNTER — Other Ambulatory Visit: Payer: Self-pay | Admitting: Family Medicine

## 2015-09-04 DIAGNOSIS — Z01419 Encounter for gynecological examination (general) (routine) without abnormal findings: Secondary | ICD-10-CM | POA: Diagnosis not present

## 2015-09-04 DIAGNOSIS — Z124 Encounter for screening for malignant neoplasm of cervix: Secondary | ICD-10-CM | POA: Diagnosis not present

## 2015-09-04 DIAGNOSIS — Z1231 Encounter for screening mammogram for malignant neoplasm of breast: Secondary | ICD-10-CM | POA: Diagnosis not present

## 2015-09-04 DIAGNOSIS — Z6823 Body mass index (BMI) 23.0-23.9, adult: Secondary | ICD-10-CM | POA: Diagnosis not present

## 2015-09-04 DIAGNOSIS — Z1272 Encounter for screening for malignant neoplasm of vagina: Secondary | ICD-10-CM | POA: Diagnosis not present

## 2015-09-04 DIAGNOSIS — N939 Abnormal uterine and vaginal bleeding, unspecified: Secondary | ICD-10-CM | POA: Diagnosis not present

## 2015-09-04 DIAGNOSIS — Z803 Family history of malignant neoplasm of breast: Secondary | ICD-10-CM | POA: Diagnosis not present

## 2015-09-04 LAB — HM MAMMOGRAPHY

## 2015-09-04 NOTE — Telephone Encounter (Signed)
Rx sent to the pharmacy by e-script.//AB/CMA 

## 2015-09-06 ENCOUNTER — Emergency Department (HOSPITAL_BASED_OUTPATIENT_CLINIC_OR_DEPARTMENT_OTHER)
Admission: EM | Admit: 2015-09-06 | Discharge: 2015-09-06 | Disposition: A | Discharge status: Home / Self Care | Payer: Medicare Other | Attending: Emergency Medicine | Admitting: Emergency Medicine

## 2015-09-06 ENCOUNTER — Encounter (HOSPITAL_BASED_OUTPATIENT_CLINIC_OR_DEPARTMENT_OTHER): Payer: Self-pay | Admitting: *Deleted

## 2015-09-06 ENCOUNTER — Emergency Department (HOSPITAL_BASED_OUTPATIENT_CLINIC_OR_DEPARTMENT_OTHER): Payer: Medicare Other

## 2015-09-06 DIAGNOSIS — W231XXA Caught, crushed, jammed, or pinched between stationary objects, initial encounter: Secondary | ICD-10-CM | POA: Insufficient documentation

## 2015-09-06 DIAGNOSIS — M1990 Unspecified osteoarthritis, unspecified site: Secondary | ICD-10-CM | POA: Diagnosis not present

## 2015-09-06 DIAGNOSIS — Z8673 Personal history of transient ischemic attack (TIA), and cerebral infarction without residual deficits: Secondary | ICD-10-CM | POA: Insufficient documentation

## 2015-09-06 DIAGNOSIS — Z87891 Personal history of nicotine dependence: Secondary | ICD-10-CM | POA: Insufficient documentation

## 2015-09-06 DIAGNOSIS — J45909 Unspecified asthma, uncomplicated: Secondary | ICD-10-CM | POA: Insufficient documentation

## 2015-09-06 DIAGNOSIS — S6991XA Unspecified injury of right wrist, hand and finger(s), initial encounter: Secondary | ICD-10-CM

## 2015-09-06 DIAGNOSIS — Y999 Unspecified external cause status: Secondary | ICD-10-CM | POA: Diagnosis not present

## 2015-09-06 DIAGNOSIS — M7989 Other specified soft tissue disorders: Secondary | ICD-10-CM | POA: Diagnosis not present

## 2015-09-06 DIAGNOSIS — Y939 Activity, unspecified: Secondary | ICD-10-CM | POA: Insufficient documentation

## 2015-09-06 DIAGNOSIS — S60221A Contusion of right hand, initial encounter: Secondary | ICD-10-CM | POA: Insufficient documentation

## 2015-09-06 DIAGNOSIS — Y9222 Religious institution as the place of occurrence of the external cause: Secondary | ICD-10-CM | POA: Insufficient documentation

## 2015-09-06 NOTE — ED Provider Notes (Signed)
CSN: [id number]     Arrival date & time 09/06/15  1134 History   First MD Initiated Contact with Patient 09/06/15 1148     Chief Complaint  Patient presents with  . Hand Injury     (Consider location/radiation/quality/duration/timing/severity/associated sxs/prior Treatment) The history is provided by the patient and medical records. No language interpreter was used.     Alexandria [name] is a 70 y.o. female  with a hx of arthritis, DVT, TIA, MI presents to the Emergency Department complaining of gradual, persistent, progressively worsening swelling and ecchymosis of the dorsum of the right hand after closing it in a set of risers at church last night. She has treated it with ice.  No other therapies PTA.  She reports her concern is decreased ROM of the right pointer finger.  She is anticoagulated on Plavix.  No numbness or tingling, no open wounds.    Past Medical History  Diagnosis Date  . Arthritis   . Diverticulitis   . Migraines   . DVT (deep venous thrombosis) ([place])     lower extremity  . Colon polyp   . Thyroid disease   . TIA (transient ischemic attack)     8 years ago  . Heart murmur   . H/O cardiac catheterization 2004    Normal coronary arteries  . Hx of echocardiogram 01/27/2010    Normal Ef 55% the transmitral spectral doppler flow pattern is normal for age. the left ventricular wall motion is normal  . History of stress test 05/21/2011  . Asthmatic bronchitis with acute exacerbation 02/06/2015  . Heart attack ([place])     10 years ago   Past Surgical History  Procedure Laterality Date  . Vaginal hysterectomy  10/17/1998    Dory Horn  . Breast biopsy      Isaiah Blakes  . Total knee arthroplasty      Bilateral x's 2  . Neck surgery      x's 2  . Cardiac catheterization      10/2013  . Left heart catheterization with coronary angiogram N/A 10/25/2013    Procedure: LEFT HEART CATHETERIZATION WITH CORONARY ANGIOGRAM;  Surgeon: Troy Sine, MD;  Location: [facility] CATH  LAB;  Service: Cardiovascular;  Laterality: N/A;   Family History  Problem Relation Age of Onset  . Arthritis    . Colon cancer Father   . HIV Brother     3  . Kidney cancer Brother   . Colon cancer Brother   . Lung cancer Brother   . Other Brother     Mouth Cancer  . Ovarian cancer Mother   . Uterine cancer Mother   . Prostate cancer Father   . Heart disease Maternal Grandmother   . Stroke Maternal Grandmother   . Hypertension Maternal Grandmother   . Diabetes Maternal Grandmother    Social History  Substance Use Topics  . Smoking status: Former Smoker -- 2 years    Quit date: 04/06/1969  . Smokeless tobacco: Never Used     Comment: socially smoked x 2 years  . Alcohol Use: No   OB History    No data available     Review of Systems  Constitutional: Negative for fever and chills.  Gastrointestinal: Negative for nausea and vomiting.  Musculoskeletal: Positive for joint swelling and arthralgias ( right hand). Negative for back pain, neck pain and neck stiffness.  Skin: Negative for wound.  Neurological: Negative for numbness.  Hematological: Does not bruise/bleed easily.  Psychiatric/Behavioral: The  patient is not nervous/anxious.   All other systems reviewed and are negative.     Allergies  Prednisone; Atrovent hfa; Cheratussin ac; Hydrocodone; Influenza vaccines; and Motrin  Home Medications   Prior to Admission medications   Medication Sig Start Date End Date Taking? Authorizing Provider  amoxicillin-clavulanate (AUGMENTIN) 875-125 MG tablet Take 1 tablet by mouth 2 (two) times daily. 08/20/15   Rosalita Chessman Chase, DO  Ascorbic Acid (VITAMIN C PO) Take 2 tablets by mouth daily.     Historical Provider, MD  Biotin (RA BIOTIN) 1000 MCG tablet Take 1,000 mcg by mouth 2 (two) times daily.     Historical Provider, MD  BREWERS YEAST PO Take 2 tablets by mouth 2 (two) times daily.     Historical Provider, MD  dicyclomine (BENTYL) 20 MG tablet TAKE 1 TABLET BY  MOUTH 4 TIMES A DAY AS NEEDED 04/18/15   Rosalita Chessman Chase, DO  estradiol (ESTRACE) 2 MG tablet Take 2 mg by mouth daily. 05/09/15   Historical Provider, MD  furosemide (LASIX) 40 MG tablet Take 0.5 tablets (20 mg total) by mouth daily. 06/11/15   Troy Sine, MD  furosemide (LASIX) 40 MG tablet TAKE 1 TABLET (40 MG TOTAL) BY MOUTH 2 (TWO) TIMES DAILY. 09/04/15   Rosalita Chessman Chase, DO  lactulose, encephalopathy, (CHRONULAC) 10 GM/15ML SOLN TAKE 30 MLS (20 G TOTAL) BY MOUTH EVERY 6 (SIX) HOURS AS NEEDED. 08/12/15   Historical Provider, MD  Multiple Vitamin (MULTIVITAMIN) tablet Take 1 tablet by mouth daily.    Historical Provider, MD  potassium chloride SA (K-DUR,KLOR-CON) 20 MEQ tablet Take 2 tablets (40 mEq total) by mouth daily. 06/11/15   Troy Sine, MD  Respiratory Therapy Supplies (FLUTTER) DEVI Use as directed 02/14/15   Tanda Rockers, MD  SYNTHROID 112 MCG tablet TAKE 1 TABLET (112 MCG TOTAL) BY MOUTH DAILY BEFORE BREAKFAST. 04/18/15   Rosalita Chessman Chase, DO  vitamin D, CHOLECALCIFEROL, 400 UNITS tablet Take 400 Units by mouth 2 (two) times daily.     Historical Provider, MD   BP 137/80 mmHg  Pulse 74  Temp(Src) 99 F (37.2 C) (Oral)  Resp 18  Ht 5\' 4"  (1.626 m)  Wt 59.421 kg  BMI 22.47 kg/m2  SpO2 96% Physical Exam  Constitutional: She appears well-developed and well-nourished. No distress.  HENT:  Head: Normocephalic and atraumatic.  Eyes: Conjunctivae are normal.  Cardiovascular: Normal rate, regular rhythm and intact distal pulses.   Capillary refill < 3 sec  Pulmonary/Chest: Effort normal.  Musculoskeletal: She exhibits tenderness. She exhibits no edema.       Right hand: She exhibits decreased range of motion, tenderness, bony tenderness and swelling. She exhibits normal two-point discrimination, normal capillary refill, no deformity and no laceration. Normal sensation noted. She exhibits no thumb/finger opposition and no wrist extension trouble.        Hands: Neurological: She is alert. Coordination normal.  Skin: Skin is warm and dry. She is not diaphoretic.  No tenting of the skin  Psychiatric: She has a normal mood and affect.  Nursing note and vitals reviewed.   ED Course  Procedures (including critical care time)  Imaging Review Dg Hand Complete Right  09/06/2015  CLINICAL DATA:  Crush injury between risers last night. Metacarpal pain and swelling. EXAM: RIGHT HAND - COMPLETE 3+ VIEW COMPARISON:  11/30/2014 FINDINGS: No acute or traumatic finding. 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 Probable soft tissue  swelling. IMPRESSION: No acute bone finding.  Arthritic changes as outlined above. Electronically Signed   By: Nelson Chimes M.D.   On: 09/06/2015 12:32   I have personally reviewed and evaluated these images and lab results as part of my medical decision-making.    MDM   Final diagnoses:  Hand injury, right, initial encounter   Alexandria [name] presents with right hand pain > 12 hours after catching it in the risers.  Tenderness and ecchymosis noted.  No open wounds.    Patient X-Ray negative for obvious fracture or dislocation. Pain declined pain control in ED. Pt advised to follow up with PCP if symptoms persist for possibility of missed fracture diagnosis. Conservative therapy recommended and discussed. Patient will be dc home & is agreeable with above plan.  The patient was discussed with and seen by Dr. Sabra Heck who agrees with the treatment plan.    Jarrett Soho Shikha Bibb, PA-C 09/06/15 1246  Noemi Chapel, MD 09/06/15 1420

## 2015-09-06 NOTE — ED Notes (Signed)
Right hand injury. She slammed it in between risers last night. Bruising noted. She has been using ice.

## 2015-09-06 NOTE — Discharge Instructions (Signed)
1. Medications: Use tylenol as needed for pain control, usual home medications 2. Treatment: rest, ice, elevate, drink plenty of fluids, gentle stretching 3. Follow Up: Please followup with your PCP in 1 week if no improvement for discussion of your diagnoses and further evaluation after today's visit; Please return to the ER for worsening symptoms or other concerns

## 2015-09-11 ENCOUNTER — Encounter: Payer: Self-pay | Admitting: Family Medicine

## 2015-10-04 DIAGNOSIS — L853 Xerosis cutis: Secondary | ICD-10-CM | POA: Diagnosis not present

## 2015-10-04 DIAGNOSIS — L821 Other seborrheic keratosis: Secondary | ICD-10-CM | POA: Diagnosis not present

## 2015-10-14 ENCOUNTER — Other Ambulatory Visit: Payer: Self-pay | Admitting: Family Medicine

## 2015-10-15 ENCOUNTER — Encounter: Payer: Self-pay | Admitting: Medical

## 2015-10-15 ENCOUNTER — Ambulatory Visit (INDEPENDENT_AMBULATORY_CARE_PROVIDER_SITE_OTHER): Payer: Medicare Other | Admitting: Medical

## 2015-10-15 VITALS — BP 110/70 | HR 78 | Temp 98.1°F | Ht 64.0 in | Wt 137.0 lb

## 2015-10-15 DIAGNOSIS — G4700 Insomnia, unspecified: Secondary | ICD-10-CM

## 2015-10-15 DIAGNOSIS — M549 Dorsalgia, unspecified: Secondary | ICD-10-CM | POA: Diagnosis not present

## 2015-10-15 DIAGNOSIS — R1013 Epigastric pain: Secondary | ICD-10-CM | POA: Diagnosis not present

## 2015-10-15 DIAGNOSIS — Z638 Other specified problems related to primary support group: Secondary | ICD-10-CM

## 2015-10-15 DIAGNOSIS — R5383 Other fatigue: Secondary | ICD-10-CM

## 2015-10-15 DIAGNOSIS — R39859 Costovertebral (angle) tenderness, unspecified side: Secondary | ICD-10-CM

## 2015-10-15 DIAGNOSIS — M79641 Pain in right hand: Secondary | ICD-10-CM

## 2015-10-15 DIAGNOSIS — F439 Reaction to severe stress, unspecified: Secondary | ICD-10-CM

## 2015-10-15 LAB — POC URINALSYSI DIPSTICK (AUTOMATED)
BILIRUBIN UA: NEGATIVE
GLUCOSE UA: NEGATIVE
Ketones, UA: NEGATIVE
LEUKOCYTES UA: NEGATIVE
NITRITE UA: NEGATIVE
PH UA: 6
Protein, UA: NEGATIVE
RBC UA: NEGATIVE
Spec Grav, UA: 1.015
UROBILINOGEN UA: 0.2

## 2015-10-15 MED ORDER — CLONAZEPAM 0.25 MG PO TBDP: 0.2500 mg | ORAL_TABLET | Freq: Every day | ORAL | Status: DC

## 2015-10-15 MED ORDER — RANITIDINE HCL 150 MG PO CAPS: 150.0000 mg | ORAL_CAPSULE | Freq: Two times a day (BID) | ORAL | Status: DC

## 2015-10-15 NOTE — Patient Instructions (Addendum)
For your stress and insomnia, I am prescribing clonazepam to use at night.  For back pain will get ua and culture.  For fatigue will get cbc, cmp, and tsh.  For abdomen pain and reflux will rx ranitidine. But also get amylase and lipase since you report some hx of pancrease issues in remote past.   For rt hand pain will refer you to hand specialist since 4 wks out from injury and xrays were negative.  You mentioned rare cough. If labs negative and cough persists then will need to get chest xray. So please keep this in mind.  Follow up in 10-14 days or as needed

## 2015-10-15 NOTE — Progress Notes (Signed)
Pre visit review using our clinic review tool, if applicable. No additional management support is needed unless otherwise documented below in the visit note. 

## 2015-10-15 NOTE — Addendum Note (Signed)
Addended by: Tasia Catchings on: 10/15/2015 01:05 PM   Modules accepted: Orders

## 2015-10-15 NOTE — Progress Notes (Signed)
Subjective:    Patient ID: Alexandria [name], female    DOB: 11/12/1945, 70 y.o.   MRN: [id number]  HPI  Pt in for recent fatigued recently. She states this has been going on for 6 wks. Pt states she falls asleep at times and wakes up. She is waking up some in middle of the night as well. Pt states life is stressful. She lives with [place] vet.   Also having issues dealing with insurance claim.  Pt has no uti symptoms. No fevers, no chills or sweats. Rare cough. Some reflux recenlty but not severe at night. Pt is not on any med for reflux.  Pt has history of hypothyroid.  Some side back pain recently last 2 days.  But no uti signs and sympotms. Faint stomach pain recently.   Also had injury to left hand 3-4 weeks ago. Riser with her singing group caught her hand. She went to ED and neg xray. But she has pain between 2nd and 3rd digit at base of hand.    Review of Systems  Constitutional: Positive for fatigue. Negative for fever and chills.  HENT: Negative for congestion and sinus pressure.   Respiratory: Positive for cough. Negative for chest tightness, shortness of breath and wheezing.   Cardiovascular: Negative for chest pain and palpitations.  Gastrointestinal: Negative for abdominal pain.  Genitourinary: Negative for dysuria, urgency, hematuria and flank pain.  Musculoskeletal: Negative for back pain.       Rt hand pain,.  Skin: Negative for rash.  Neurological: Negative for dizziness and headaches.  Hematological: Negative for adenopathy. Does not bruise/bleed easily.  Psychiatric/Behavioral: Positive for sleep disturbance. Negative for suicidal ideas, behavioral problems, confusion and dysphoric mood. The patient is nervous/anxious.        Stress.     Past Medical History  Diagnosis Date  . Arthritis   . Diverticulitis   . Migraines   . DVT (deep venous thrombosis) ([place])     lower extremity  . Colon polyp   . Thyroid disease   . TIA (transient ischemic attack)      8 years ago  . Heart murmur   . H/O cardiac catheterization 2004    Normal coronary arteries  . Hx of echocardiogram 01/27/2010    Normal Ef 55% the transmitral spectral doppler flow pattern is normal for age. the left ventricular wall motion is normal  . History of stress test 05/21/2011  . Asthmatic bronchitis with acute exacerbation 02/06/2015  . Heart attack ([place])     10 years ago     Social History   Social History  . Marital Status: Married    Spouse Name: N/A  . Number of Children: 3  . Years of Education: N/A   Occupational History  . DISABLED    Social History Main Topics  . Smoking status: Former Smoker -- 2 years    Quit date: 04/06/1969  . Smokeless tobacco: Never Used     Comment: socially smoked x 2 years  . Alcohol Use: No  . Drug Use: No  . Sexual Activity: Not on file   Other Topics Concern  . Not on file   Social History Narrative    Past Surgical History  Procedure Laterality Date  . Vaginal hysterectomy  10/17/1998    Dory Horn  . Breast biopsy      Isaiah Blakes  . Total knee arthroplasty      Bilateral x's 2  . Neck surgery  x's 2  . Cardiac catheterization      10/2013  . Left heart catheterization with coronary angiogram N/A 10/25/2013    Procedure: LEFT HEART CATHETERIZATION WITH CORONARY ANGIOGRAM;  Surgeon: Troy Sine, MD;  Location: [facility] CATH LAB;  Service: Cardiovascular;  Laterality: N/A;    Family History  Problem Relation Age of Onset  . Arthritis    . Colon cancer Father   . HIV Brother     53  . Kidney cancer Brother   . Colon cancer Brother   . Lung cancer Brother   . Other Brother     Mouth Cancer  . Ovarian cancer Mother   . Uterine cancer Mother   . Prostate cancer Father   . Heart disease Maternal Grandmother   . Stroke Maternal Grandmother   . Hypertension Maternal Grandmother   . Diabetes Maternal Grandmother     Allergies  Allergen Reactions  . Prednisone Other (See Comments)    Homicidal  .  Atrovent Hfa [Ipratropium Bromide Hfa] Other (See Comments)    Pt could not sleep  . Cheratussin Ac [Guaifenesin-Codeine]   . Hydrocodone Itching  . Influenza Vaccines Other (See Comments)    Pt reports heart attack after last flu shot  . Motrin [Ibuprofen] Other (See Comments)    "Gives false reading in blood"    Current Outpatient Prescriptions on File Prior to Visit  Medication Sig Dispense Refill  . Ascorbic Acid (VITAMIN C PO) Take 2 tablets by mouth daily.     . Biotin (RA BIOTIN) 1000 MCG tablet Take 1,000 mcg by mouth 2 (two) times daily.     Marland Kitchen BREWERS YEAST PO Take 2 tablets by mouth 2 (two) times daily.     . clopidogrel (PLAVIX) 75 MG tablet TAKE 1 TABLET (75 MG TOTAL) BY MOUTH ONCE. 90 tablet 1  . dicyclomine (BENTYL) 20 MG tablet TAKE 1 TABLET BY MOUTH 4 TIMES A DAY AS NEEDED 360 tablet 1  . estradiol (ESTRACE) 2 MG tablet Take 2 mg by mouth daily.  11  . furosemide (LASIX) 40 MG tablet Take 0.5 tablets (20 mg total) by mouth daily. 60 tablet 3  . lactulose, encephalopathy, (CHRONULAC) 10 GM/15ML SOLN TAKE 30 MLS (20 G TOTAL) BY MOUTH EVERY 6 (SIX) HOURS AS NEEDED.  1  . Multiple Vitamin (MULTIVITAMIN) tablet Take 1 tablet by mouth daily.    . potassium chloride SA (K-DUR,KLOR-CON) 20 MEQ tablet Take 2 tablets (40 mEq total) by mouth daily. 180 tablet 2  . Respiratory Therapy Supplies (FLUTTER) DEVI Use as directed 1 each 0  . SYNTHROID 112 MCG tablet TAKE 1 TABLET (112 MCG TOTAL) BY MOUTH DAILY BEFORE BREAKFAST. 90 tablet 1  . vitamin D, CHOLECALCIFEROL, 400 UNITS tablet Take 400 Units by mouth 2 (two) times daily.      No current facility-administered medications on file prior to visit.    BP 110/70 mmHg  Pulse 78  Temp(Src) 98.1 F (36.7 C) (Oral)  Ht 5\' 4"  (1.626 m)  Wt 137 lb (62.143 kg)  BMI 23.50 kg/m2  SpO2 98%       Objective:   Physical Exam  General Appearance- Not in acute distress.  HEENT Eyes- Scleraeral/Conjuntiva-bilat- Not Yellow. Mouth &  Throat- Normal.  Neck- no thyromegaly.  Chest and Lung Exam Auscultation: Breath sounds:-Normal. Adventitious sounds:- No Adventitious sounds.  Cardiovascular Auscultation:Rythm - Regular. Heart Sounds -Normal heart sounds.  Abdomen Inspection:-Inspection Normal.  Palpation/Perucssion: Palpation and Percussion of the abdomen reveal- faint  epigastric and upper quadrant region faint tender, No Rebound tenderness, No rigidity(Guarding) and No Palpable abdominal masses.  Liver:-Normal.  Spleen:- Normal.   Neuro- CN- XII grossly intact.  Rt hand- mild tender at base of 2nd and 3rd digit(lumbrical region)  Back- mild rt cva area tenderness.      Assessment & Plan:  For your stress and insomnia, I am prescribing clonazepam to use at night.  For back pain will get ua and culture.  For fatigue will get cbc, cmp, and tsh.  For abdomen pain and reflux will rx ranitidine. But also get amylase and lipase since you report some hx of pancrease issues in remote past.   For rt hand pain will refer you to hand specialist since 4 wks out from injury and xrays were negative.  You mentioned rare cough. If labs negative and cough persists then will need to get chest xray. So please keep this in mind.  Follow up in 10-14 days or as needed  Monnie Anspach, Percell Miller, [employer]

## 2015-10-16 LAB — CBC WITH DIFFERENTIAL/PLATELET
Basophils Absolute: 0 10*3/uL (ref 0.0–0.1)
Basophils Relative: 1 % (ref 0.0–3.0)
EOS ABS: 0.2 10*3/uL (ref 0.0–0.7)
EOS PCT: 4.5 % (ref 0.0–5.0)
HCT: 39.1 % (ref 36.0–46.0)
HEMOGLOBIN: 13.4 g/dL (ref 12.0–15.0)
Lymphocytes Relative: 40.2 % (ref 12.0–46.0)
Lymphs Abs: 1.7 10*3/uL (ref 0.7–4.0)
MCHC: 34.3 g/dL (ref 30.0–36.0)
MCV: 96.2 fl (ref 78.0–100.0)
MONO ABS: 0.5 10*3/uL (ref 0.1–1.0)
Monocytes Relative: 11.7 % (ref 3.0–12.0)
Neutro Abs: 1.8 10*3/uL (ref 1.4–7.7)
Neutrophils Relative %: 42.6 % — ABNORMAL LOW (ref 43.0–77.0)
Platelets: 187 10*3/uL (ref 150.0–400.0)
RBC: 4.07 Mil/uL (ref 3.87–5.11)
RDW: 14 % (ref 11.5–15.5)
WBC: 4.3 10*3/uL (ref 4.0–10.5)

## 2015-10-16 LAB — COMPREHENSIVE METABOLIC PANEL
ALBUMIN: 4.2 g/dL (ref 3.5–5.2)
ALK PHOS: 62 U/L (ref 39–117)
ALT: 18 U/L (ref 0–35)
AST: 24 U/L (ref 0–37)
BILIRUBIN TOTAL: 0.5 mg/dL (ref 0.2–1.2)
BUN: 10 mg/dL (ref 6–23)
CO2: 31 mEq/L (ref 19–32)
CREATININE: 0.79 mg/dL (ref 0.40–1.20)
Calcium: 9.4 mg/dL (ref 8.4–10.5)
Chloride: 102 mEq/L (ref 96–112)
GFR: 76.29 mL/min (ref >60.00)
Glucose, Bld: 85 mg/dL (ref 70–99)
Potassium: 3.4 mEq/L — ABNORMAL LOW (ref 3.5–5.1)
SODIUM: 142 meq/L (ref 135–145)
TOTAL PROTEIN: 7.4 g/dL (ref 6.0–8.3)

## 2015-10-16 LAB — AMYLASE: Amylase: 41 U/L (ref 27–131)

## 2015-10-16 LAB — LIPASE: Lipase: 14 U/L (ref 11.0–59.0)

## 2015-10-16 LAB — TSH: TSH: 1.89 u[IU]/mL (ref 0.35–4.50)

## 2015-10-17 ENCOUNTER — Telehealth: Payer: Self-pay | Admitting: Family Medicine

## 2015-10-17 LAB — URINE CULTURE
Colony Count: NO GROWTH
Organism ID, Bacteria: NO GROWTH

## 2015-10-17 NOTE — Telephone Encounter (Signed)
Relation to [id number] Call back number:[phone]   Reason for call: Patient states labs were overall good and would like to know why she's tired. Please advise

## 2015-10-17 NOTE — Telephone Encounter (Signed)
Spoke with pt and she states that she is still feeling tired. Pt would like to be advised further as to what is next to help with her fatigue.

## 2015-10-18 ENCOUNTER — Telehealth: Payer: Self-pay | Admitting: Medical

## 2015-10-18 ENCOUNTER — Other Ambulatory Visit (INDEPENDENT_AMBULATORY_CARE_PROVIDER_SITE_OTHER): Payer: Medicare Other

## 2015-10-18 DIAGNOSIS — R5383 Other fatigue: Secondary | ICD-10-CM | POA: Diagnosis not present

## 2015-10-18 NOTE — Telephone Encounter (Signed)
I ordered a lot of labs and did not find cause. But could add b12. She could come in and get that done. But when work up negative often think that stress or anxiety could be factor in fatigue. Sometimes rx med like effexor. This could help with anxiety and increase energy. How does she feel about this?

## 2015-10-18 NOTE — Telephone Encounter (Signed)
Pt agreed to come in and have B12 drawn.  Lab appt scheduled for today at 4pm.  As far as starting Effexor, she does not want to start at this time.  She says she is not experiencing anxiety or decreased energy.  She says she's just not sleeping well and is under a lot of stress (which she says is normal for her).

## 2015-10-25 ENCOUNTER — Telehealth: Payer: Self-pay | Admitting: Medical

## 2015-10-25 DIAGNOSIS — R5383 Other fatigue: Secondary | ICD-10-CM

## 2015-10-25 NOTE — Telephone Encounter (Signed)
Put b12 order again in again.

## 2015-10-25 NOTE — Telephone Encounter (Signed)
Pt called in for lab results  °

## 2015-10-25 NOTE — Telephone Encounter (Signed)
The B12 lab was cancelled in the system. There are no results in the chart. Please advise.

## 2015-10-28 ENCOUNTER — Telehealth: Payer: Self-pay | Admitting: Medical

## 2015-10-28 NOTE — Telephone Encounter (Signed)
Pt states that she came in over a week ago this past Friday and her blood work was completed but it was canceled in the system. Please advise.

## 2015-10-28 NOTE — Telephone Encounter (Signed)
The lab is checking why they canceled test. They will let us know if they made a mistake. Calling Alexandria [name] and then lab will let us know.

## 2015-10-29 ENCOUNTER — Telehealth: Payer: Self-pay | Admitting: Medical

## 2015-10-29 NOTE — Telephone Encounter (Signed)
Spoke with pt and informed her of the results and she voices understanding. Pt did not have any further questions. Pt was advised per E. Saguier to follow up with Dr. Carollee Herter since her recent blood work come back normal. Pt states that she will follow up with Dr. Carollee Herter.

## 2015-10-29 NOTE — Telephone Encounter (Signed)
Pt b12 was not low. In fact over 2000. All other labs negative. I was thinking pt stress and possible anxiety was effecting her energy/causing fatigue. I had offered trial of effexor. She declined.   In light of negative cbc, cmp, tsh, urine culture, and b12. Would ask that she now see pcp. Not sure what else to order in light of fairly extensive negative work up.   Also please apologize for delay in getting b12 level. Lab made error. They sent it to another lab. And that lab never faxed [place] the result.

## 2015-11-04 ENCOUNTER — Encounter: Payer: Self-pay | Admitting: Medical

## 2015-11-05 DIAGNOSIS — M19041 Primary osteoarthritis, right hand: Secondary | ICD-10-CM | POA: Diagnosis not present

## 2015-11-05 DIAGNOSIS — M19141 Post-traumatic osteoarthritis, right hand: Secondary | ICD-10-CM | POA: Diagnosis not present

## 2015-11-18 ENCOUNTER — Encounter: Payer: Self-pay | Admitting: Family Medicine

## 2015-11-18 ENCOUNTER — Ambulatory Visit (INDEPENDENT_AMBULATORY_CARE_PROVIDER_SITE_OTHER): Payer: Medicare Other | Admitting: Family Medicine

## 2015-11-18 VITALS — BP 134/86 | HR 71 | Temp 97.9°F | Wt 139.6 lb

## 2015-11-18 DIAGNOSIS — G4700 Insomnia, unspecified: Secondary | ICD-10-CM | POA: Diagnosis not present

## 2015-11-18 DIAGNOSIS — F909 Attention-deficit hyperactivity disorder, unspecified type: Secondary | ICD-10-CM | POA: Diagnosis not present

## 2015-11-18 DIAGNOSIS — R1012 Left upper quadrant pain: Secondary | ICD-10-CM

## 2015-11-18 DIAGNOSIS — R040 Epistaxis: Secondary | ICD-10-CM | POA: Diagnosis not present

## 2015-11-18 DIAGNOSIS — F988 Other specified behavioral and emotional disorders with onset usually occurring in childhood and adolescence: Secondary | ICD-10-CM

## 2015-11-18 NOTE — Patient Instructions (Signed)

## 2015-11-18 NOTE — Progress Notes (Signed)
Patient ID: Alexandria [name], female    DOB: 12-Jan-1946  Age: 70 y.o. MRN: [id number]    Subjective:  Subjective  HPI MERRIEL KENDRICK presents for nose bleeds.  First one end of July while she was at the beach.  She had a fan on and it took her under 30 min to stop and she had 2 since she has been home but not as bad.   No congestion but + Pnd.  Taking mucinex am and pm  She is struggling with focusing and feels like she is in "[place]".  She has noticed this over the last few months.  Worse in am.  She has never had a problems with this before.    Review of Systems  Constitutional: Negative for appetite change, diaphoresis, fatigue and unexpected weight change.  Eyes: Negative for pain, redness and visual disturbance.  Respiratory: Negative for cough, chest tightness, shortness of breath and wheezing.   Cardiovascular: Negative for chest pain, palpitations and leg swelling.  Gastrointestinal: Positive for abdominal pain. Negative for abdominal distention.  Endocrine: Negative for cold intolerance, heat intolerance, polydipsia, polyphagia and polyuria.  Genitourinary: Negative for difficulty urinating, dysuria and frequency.  Neurological: Negative for dizziness, light-headedness, numbness and headaches.  Psychiatric/Behavioral: Positive for decreased concentration and sleep disturbance. Negative for agitation, behavioral problems, dysphoric mood, self-injury and suicidal ideas.    History Past Medical History:  Diagnosis Date  . Arthritis   . Asthmatic bronchitis with acute exacerbation 02/06/2015  . Colon polyp   . Diverticulitis   . DVT (deep venous thrombosis) ([place])    lower extremity  . H/O cardiac catheterization 2004   Normal coronary arteries  . Heart attack ([place])    10 years ago  . Heart murmur   . History of stress test 05/21/2011  . Hx of echocardiogram 01/27/2010   Normal Ef 55% the transmitral spectral doppler flow pattern is normal for age. the left  ventricular wall motion is normal  . Migraines   . Thyroid disease   . TIA (transient ischemic attack)    8 years ago    She has a past surgical history that includes Vaginal hysterectomy (10/17/1998); Breast biopsy; Total knee arthroplasty; Neck surgery; Cardiac catheterization; and left heart catheterization with coronary angiogram (N/A, 10/25/2013).   Her family history includes Colon cancer in her brother and father; Diabetes in her maternal grandmother; HIV in her brother; Heart disease in her maternal grandmother; Hypertension in her maternal grandmother; Kidney cancer in her brother; Lung cancer in her brother; Other in her brother; Ovarian cancer in her mother; Prostate cancer in her father; Stroke in her maternal grandmother; Uterine cancer in her mother.She reports that she quit smoking about 46 years ago. She quit after 2.00 years of use. She has never used smokeless tobacco. She reports that she does not drink alcohol or use drugs.  Current Outpatient Prescriptions on File Prior to Visit  Medication Sig Dispense Refill  . Ascorbic Acid (VITAMIN C PO) Take 2 tablets by mouth daily.     . Biotin (RA BIOTIN) 1000 MCG tablet Take 1,000 mcg by mouth 2 (two) times daily.     Marland Kitchen BREWERS YEAST PO Take 2 tablets by mouth 2 (two) times daily.     . clonazePAM (KLONOPIN) 0.25 MG disintegrating tablet Take 1 tablet (0.25 mg total) by mouth at bedtime. 7 tablet 0  . clopidogrel (PLAVIX) 75 MG tablet TAKE 1 TABLET (75 MG TOTAL) BY MOUTH ONCE. 90 tablet 1  .  dicyclomine (BENTYL) 20 MG tablet TAKE 1 TABLET BY MOUTH 4 TIMES A DAY AS NEEDED 360 tablet 1  . estradiol (ESTRACE) 2 MG tablet Take 2 mg by mouth daily.  11  . furosemide (LASIX) 40 MG tablet Take 0.5 tablets (20 mg total) by mouth daily. 60 tablet 3  . lactulose, encephalopathy, (CHRONULAC) 10 GM/15ML SOLN TAKE 30 MLS (20 G TOTAL) BY MOUTH EVERY 6 (SIX) HOURS AS NEEDED.  1  . Multiple Vitamin (MULTIVITAMIN) tablet Take 1 tablet by mouth daily.     . potassium chloride SA (K-DUR,KLOR-CON) 20 MEQ tablet Take 2 tablets (40 mEq total) by mouth daily. 180 tablet 2  . Respiratory Therapy Supplies (FLUTTER) DEVI Use as directed 1 each 0  . SYNTHROID 112 MCG tablet TAKE 1 TABLET (112 MCG TOTAL) BY MOUTH DAILY BEFORE BREAKFAST. 90 tablet 1  . vitamin D, CHOLECALCIFEROL, 400 UNITS tablet Take 400 Units by mouth 2 (two) times daily.      No current facility-administered medications on file prior to visit.      Objective:  Objective  Physical Exam  Constitutional: She is oriented to person, place, and time. She appears well-developed and well-nourished.  HENT:  Head: Normocephalic and atraumatic.  Eyes: Conjunctivae and EOM are normal.  Neck: Normal range of motion. Neck supple. No JVD present. Carotid bruit is not present. No thyromegaly present.  Cardiovascular: Normal rate, regular rhythm and normal heart sounds.   No murmur heard. Pulmonary/Chest: Effort normal and breath sounds normal. No respiratory distress. She has no wheezes. She has no rales. She exhibits no tenderness.  Abdominal: There is tenderness in the left upper quadrant. There is guarding. There is no rebound. No hernia.    Musculoskeletal: She exhibits no edema.  Neurological: She is alert and oriented to person, place, and time.  Psychiatric: She has a normal mood and affect.   BP 134/86 (BP Location: Left Arm, Patient Position: Sitting, Cuff Size: Normal)   Pulse 71   Temp 97.9 F (36.6 C)   Wt 139 lb 9.6 oz (63.3 kg)   SpO2 95%   BMI 23.96 kg/m  Wt Readings from Last 3 Encounters:  11/18/15 139 lb 9.6 oz (63.3 kg)  10/15/15 137 lb (62.1 kg)  09/06/15 131 lb (59.4 kg)     Lab Results  Component Value Date   WBC 4.3 10/15/2015   HGB 13.4 10/15/2015   HCT 39.1 10/15/2015   PLT 187.0 10/15/2015   GLUCOSE 85 10/15/2015   CHOL 170 06/11/2015   TRIG 48 06/11/2015   HDL 68 06/11/2015   LDLCALC 92 06/11/2015   ALT 18 10/15/2015   AST 24 10/15/2015    NA 142 10/15/2015   K 3.4 (L) 10/15/2015   CL 102 10/15/2015   CREATININE 0.79 10/15/2015   BUN 10 10/15/2015   CO2 31 10/15/2015   TSH 1.89 10/15/2015   INR 1.1 (H) 05/07/2015    Dg Hand Complete Right  Result Date: 09/06/2015 CLINICAL DATA:  Crush injury between risers last night. Metacarpal pain and swelling. EXAM: RIGHT HAND - COMPLETE 3+ VIEW COMPARISON:  11/30/2014 FINDINGS: No acute or traumatic finding. 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 Probable soft tissue swelling. IMPRESSION: No acute bone finding.  Arthritic changes as outlined above. Electronically Signed   By: Nelson Chimes M.D.   On: 09/06/2015 12:32     Assessment & Plan:  Plan  I have discontinued Ms. Houpt's ranitidine. I am also having  her maintain her vitamin D (CHOLECALCIFEROL), Ascorbic Acid (VITAMIN C PO), BREWERS YEAST PO, multivitamin, Biotin, FLUTTER, estradiol, furosemide, potassium chloride SA, lactulose (encephalopathy), SYNTHROID, clopidogrel, dicyclomine, and clonazePAM.  No orders of the defined types were placed in this encounter.   Problem List Items Addressed This Visit      Unprioritized   Abdominal pain    Pt did not realize she was supposed to f/u with gi in  Months She will call GI for f/u      Epistaxis    None recently bp good Instructed pt to use pressure and ice  Can use afrin if needed but if becomes more frequent of heavier and harder to stop rto or go to ER      Insomnia    Secondary to worrying about husbands PTSD He is under treatment at [facility] They sleep in separate rooms because of it and her insomnia most likely why she has trouble focusing but pt was given the number for pHD with [facility] for testing if needed.         Other Visit Diagnoses    LUQ pain    -  Primary   Attention deficit disorder          Follow-up: Return if symptoms worsen or fail to improve.  [name], DO

## 2015-11-18 NOTE — Assessment & Plan Note (Signed)
Pt did not realize she was supposed to f/u with gi in  Months She will call GI for f/u

## 2015-11-18 NOTE — Progress Notes (Signed)
Pre visit review using our clinic review tool, if applicable. No additional management support is needed unless otherwise documented below in the visit note. 

## 2015-11-18 NOTE — Assessment & Plan Note (Signed)
Secondary to worrying about husbands PTSD He is under treatment at [facility] They sleep in separate rooms because of it and her insomnia most likely why she has trouble focusing but pt was given the number for pHD with [facility] for testing if needed.

## 2015-11-18 NOTE — Assessment & Plan Note (Signed)
None recently bp good Instructed pt to use pressure and ice  Can use afrin if needed but if becomes more frequent of heavier and harder to stop rto or go to ER

## 2015-11-21 ENCOUNTER — Telehealth: Payer: Self-pay | Admitting: Gastroenterology

## 2015-11-21 NOTE — Telephone Encounter (Signed)
Bloating, gas, diarrhea/constipation alternating, feeling of fullness some days.  She has not tried bentyl, she will pick that up and try it and I will add her to the wait list.  She will call back if she worsens.

## 2015-12-03 ENCOUNTER — Ambulatory Visit (INDEPENDENT_AMBULATORY_CARE_PROVIDER_SITE_OTHER): Payer: Medicare Other | Admitting: Family Medicine

## 2015-12-03 DIAGNOSIS — K5732 Diverticulitis of large intestine without perforation or abscess without bleeding: Secondary | ICD-10-CM | POA: Diagnosis not present

## 2015-12-04 ENCOUNTER — Ambulatory Visit (INDEPENDENT_AMBULATORY_CARE_PROVIDER_SITE_OTHER): Payer: Medicare Other | Admitting: Family Medicine

## 2015-12-04 ENCOUNTER — Ambulatory Visit (HOSPITAL_BASED_OUTPATIENT_CLINIC_OR_DEPARTMENT_OTHER): Payer: Medicare Other

## 2015-12-04 ENCOUNTER — Encounter (HOSPITAL_BASED_OUTPATIENT_CLINIC_OR_DEPARTMENT_OTHER): Payer: Self-pay

## 2015-12-04 ENCOUNTER — Encounter: Payer: Self-pay | Admitting: Family Medicine

## 2015-12-04 VITALS — BP 132/84 | HR 66 | Temp 98.7°F | Ht 64.0 in | Wt 135.8 lb

## 2015-12-04 DIAGNOSIS — R519 Headache, unspecified: Secondary | ICD-10-CM

## 2015-12-04 DIAGNOSIS — E876 Hypokalemia: Secondary | ICD-10-CM

## 2015-12-04 DIAGNOSIS — R202 Paresthesia of skin: Principal | ICD-10-CM

## 2015-12-04 DIAGNOSIS — M542 Cervicalgia: Secondary | ICD-10-CM | POA: Diagnosis not present

## 2015-12-04 DIAGNOSIS — R51 Headache: Secondary | ICD-10-CM

## 2015-12-04 DIAGNOSIS — R200 Anesthesia of skin: Secondary | ICD-10-CM

## 2015-12-04 LAB — CBC
HEMATOCRIT: 41.6 % (ref 36.0–46.0)
HEMOGLOBIN: 14.2 g/dL (ref 12.0–15.0)
MCHC: 34.2 g/dL (ref 30.0–36.0)
MCV: 95.8 fl (ref 78.0–100.0)
Platelets: 188 10*3/uL (ref 150.0–400.0)
RBC: 4.34 Mil/uL (ref 3.87–5.11)
RDW: 13.2 % (ref 11.5–15.5)
WBC: 4.3 10*3/uL (ref 4.0–10.5)

## 2015-12-04 LAB — COMPREHENSIVE METABOLIC PANEL
ALBUMIN: 4.2 g/dL (ref 3.5–5.2)
ALT: 19 U/L (ref 0–35)
AST: 22 U/L (ref 0–37)
Alkaline Phosphatase: 62 U/L (ref 39–117)
BILIRUBIN TOTAL: 0.6 mg/dL (ref 0.2–1.2)
BUN: 12 mg/dL (ref 6–23)
CALCIUM: 8.9 mg/dL (ref 8.4–10.5)
CHLORIDE: 100 meq/L (ref 96–112)
CO2: 35 mEq/L — ABNORMAL HIGH (ref 19–32)
CREATININE: 0.79 mg/dL (ref 0.40–1.20)
GFR: 76.26 mL/min (ref >60.00)
Glucose, Bld: 92 mg/dL (ref 70–99)
Potassium: 3.1 mEq/L — ABNORMAL LOW (ref 3.5–5.1)
Sodium: 140 mEq/L (ref 135–145)
Total Protein: 7.5 g/dL (ref 6.0–8.3)

## 2015-12-04 LAB — SEDIMENTATION RATE: SED RATE: 6 mm/h (ref 0–30)

## 2015-12-04 LAB — C-REACTIVE PROTEIN: CRP: 0.2 mg/dL — ABNORMAL LOW (ref 0.5–20.0)

## 2015-12-04 NOTE — Progress Notes (Addendum)
Patient ID: Alexandria [name], female    DOB: [birth year]  Age: 70 y.o. MRN: [id number]    Subjective:  Subjective  HPI Alexandria [name] presents for con't abd pain and diverticulitis hx-- pt was supposed to f/u with gI but she wanted to change drs and was told she could not so she just did not got back.      Review of Systems  Constitutional: Negative for appetite change, diaphoresis, fatigue and unexpected weight change.  Eyes: Negative for pain, redness and visual disturbance.  Respiratory: Negative for cough, chest tightness, shortness of breath and wheezing.   Cardiovascular: Negative for chest pain, palpitations and leg swelling.  Gastrointestinal: Positive for abdominal distention and abdominal pain.  Endocrine: Negative for cold intolerance, heat intolerance, polydipsia, polyphagia and polyuria.  Genitourinary: Negative for difficulty urinating, dysuria and frequency.  Neurological: Negative for dizziness, light-headedness, numbness and headaches.    History Past Medical History:  Diagnosis Date  . Arthritis   . Asthmatic bronchitis with acute exacerbation 02/06/2015  . Colon polyp   . Diverticulitis   . DVT (deep venous thrombosis) ([place])    lower extremity  . H/O cardiac catheterization 2004   Normal coronary arteries  . Heart attack ([place])    10 years ago  . Heart murmur   . History of stress test 05/21/2011  . Hx of echocardiogram 01/27/2010   Normal Ef 55% the transmitral spectral doppler flow pattern is normal for age. the left ventricular wall motion is normal  . Migraines   . Thyroid disease   . TIA (transient ischemic attack)    8 years ago    She has a past surgical history that includes Vaginal hysterectomy (10/17/1998); Breast biopsy; Total knee arthroplasty; Neck surgery; Cardiac catheterization; and left heart catheterization with coronary angiogram (N/A, 10/25/2013).   Her family history includes Colon cancer in her brother and father; Diabetes in  her maternal grandmother; HIV in her brother; Heart disease in her maternal grandmother; Hypertension in her maternal grandmother; Kidney cancer in her brother; Lung cancer in her brother; Other in her brother; Ovarian cancer in her mother; Prostate cancer in her father; Stroke in her maternal grandmother; Uterine cancer in her mother.She reports that she quit smoking about 46 years ago. She quit after 2.00 years of use. She has never used smokeless tobacco. She reports that she does not drink alcohol or use drugs.  Current Outpatient Prescriptions on File Prior to Visit  Medication Sig Dispense Refill  . Ascorbic Acid (VITAMIN C PO) Take 2 tablets by mouth daily.     . Biotin (RA BIOTIN) 1000 MCG tablet Take 1,000 mcg by mouth 2 (two) times daily.     Marland Kitchen BREWERS YEAST PO Take 2 tablets by mouth 2 (two) times daily.     . clonazePAM (KLONOPIN) 0.25 MG disintegrating tablet Take 1 tablet (0.25 mg total) by mouth at bedtime. 7 tablet 0  . clopidogrel (PLAVIX) 75 MG tablet TAKE 1 TABLET (75 MG TOTAL) BY MOUTH ONCE. 90 tablet 1  . dicyclomine (BENTYL) 20 MG tablet TAKE 1 TABLET BY MOUTH 4 TIMES A DAY AS NEEDED 360 tablet 1  . estradiol (ESTRACE) 2 MG tablet Take 2 mg by mouth daily.  11  . furosemide (LASIX) 40 MG tablet Take 0.5 tablets (20 mg total) by mouth daily. 60 tablet 3  . lactulose, encephalopathy, (CHRONULAC) 10 GM/15ML SOLN TAKE 30 MLS (20 G TOTAL) BY MOUTH EVERY 6 (SIX) HOURS AS NEEDED.  1  .  Multiple Vitamin (MULTIVITAMIN) tablet Take 1 tablet by mouth daily.    . potassium chloride SA (K-DUR,KLOR-CON) 20 MEQ tablet Take 2 tablets (40 mEq total) by mouth daily. 180 tablet 2  . Respiratory Therapy Supplies (FLUTTER) DEVI Use as directed 1 each 0  . SYNTHROID 112 MCG tablet TAKE 1 TABLET (112 MCG TOTAL) BY MOUTH DAILY BEFORE BREAKFAST. 90 tablet 1  . vitamin D, CHOLECALCIFEROL, 400 UNITS tablet Take 400 Units by mouth 2 (two) times daily.      No current facility-administered medications on  file prior to visit.      Objective:  Objective  Physical Exam  Constitutional: She is oriented to person, place, and time. She appears well-developed and well-nourished.  HENT:  Head: Normocephalic and atraumatic.  Eyes: Conjunctivae and EOM are normal.  Neck: Normal range of motion. Neck supple. No JVD present. Carotid bruit is not present. No thyromegaly present.  Cardiovascular: Normal rate, regular rhythm and normal heart sounds.   No murmur heard. Pulmonary/Chest: Effort normal and breath sounds normal. No respiratory distress. She has no wheezes. She has no rales. She exhibits no tenderness.  Abdominal: Soft. Bowel sounds are normal. She exhibits no distension. There is no tenderness. There is no rebound and no guarding.  Musculoskeletal: She exhibits no edema.  Neurological: She is alert and oriented to person, place, and time.  Psychiatric: She has a normal mood and affect.  Nursing note and vitals reviewed.  There were no vitals taken for this visit. Wt Readings from Last 3 Encounters:  12/04/15 135 lb 12.8 oz (61.6 kg)  11/18/15 139 lb 9.6 oz (63.3 kg)  10/15/15 137 lb (62.1 kg)     Lab Results  Component Value Date   WBC 4.3 12/04/2015   HGB 14.2 12/04/2015   HCT 41.6 12/04/2015   PLT 188.0 12/04/2015   GLUCOSE 92 12/04/2015   CHOL 170 06/11/2015   TRIG 48 06/11/2015   HDL 68 06/11/2015   LDLCALC 92 06/11/2015   ALT 19 12/04/2015   AST 22 12/04/2015   NA 140 12/04/2015   K 3.1 (L) 12/04/2015   CL 100 12/04/2015   CREATININE 0.79 12/04/2015   BUN 12 12/04/2015   CO2 35 (H) 12/04/2015   TSH 1.89 10/15/2015   INR 1.1 (H) 05/07/2015    Dg Hand Complete Right  Result Date: 09/06/2015 CLINICAL DATA:  Crush injury between risers last night. Metacarpal pain and swelling. EXAM: RIGHT HAND - COMPLETE 3+ VIEW COMPARISON:  11/30/2014 FINDINGS: No acute or traumatic finding. 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  Probable soft tissue swelling. IMPRESSION: No acute bone finding.  Arthritic changes as outlined above. Electronically Signed   By: Nelson Chimes M.D.   On: 09/06/2015 12:32     Assessment & Plan:  Plan  I am having Ms. Reinertsen maintain her vitamin D (CHOLECALCIFEROL), Ascorbic Acid (VITAMIN C PO), BREWERS YEAST PO, multivitamin, Biotin, FLUTTER, estradiol, furosemide, potassium chloride SA, lactulose (encephalopathy), SYNTHROID, clopidogrel, dicyclomine, and clonazePAM.  No orders of the defined types were placed in this encounter.   Problem List Items Addressed This Visit    None    Visit Diagnoses    Diverticulitis of colon    -  Primary    meds per orders  F/u GI Follow-up: No Follow-up on file.  [name], DO

## 2015-12-04 NOTE — Patient Instructions (Addendum)
I am sorry that you are not feeling well. We are going to get a CT of your neck and MRI of your head- in addition to bloodwork.  After your blood draw please stop by the imaging department downstairs to schedule your CT.  We are working on getting the MRI approved and set up for you asap You had STAT labs done today to check your kidney function so you can have the CT with contrast later on today.   Please let me know if you have any change or worsening of your symptoms in the meantime!

## 2015-12-04 NOTE — Progress Notes (Signed)
Pre visit review using our clinic review tool, if applicable. No additional management support is needed unless otherwise documented below in the visit note. 

## 2015-12-04 NOTE — Progress Notes (Addendum)
[place] at [facility] [address], [place], [place] [address] [phone] [id number]  Date:  12/04/2015   Name:  Alexandria [name]   DOB:  1945/07/12   MRN:  [id number]  PCP:  [name], DO    Chief Complaint: Panic Attack (c/o panic attack last Thursday pt states that she had another episode about 2 months ago. Also having facial numbness on the left side that pt noticed last Thursday after the panic attack that radiates to neck and shoulder. Pt states that numbness has been present since last Thursday, pt has hx of stroke. )   History of Present Illness:  Alexandria [name] is a 70 y.o. very pleasant female patient who presents with the following:  Today is Wednesday- she had a "panic attack" this past Thursday.  She was with her singing group; she was in practice and felt like she was "frozen" and "went blank," this lasted a few seconds.  She found that she was unable to sing, but she did not try to speak.  She just stood where she was; she did not leave practice, collapse or have to sit down.  She was able to join back in and completed the practice session. On the way home that day she felt very tired.  No further episodes of feeling frozen  The next day- Friday- she noted that her left face felt numb and tingly.  This is still present.  This sensation only goes into her head and neck but does not extend lower into her body.  She did bite her tongue last night. She has not noted any facial drooping, slurred speech, difficulty speaking or swallowing.   She has not noted any headaches or change in her vision or hearing.   She has not had any trauma to her head or neck recently  She also did have a panic attack a couple of months ago- that one was not as bad.  She had panic attacks in her younger years but until recently had not had an attack in 30 years.    She had a TIA 11 years ago while living in [place]. She cannot  remember what happened for a couple of days but knows that she was in the hospital for several days. She recovered fully; and does not have any residual symptoms from that time.    She did have an MI 49 - 14 years ago.  She had a cardiac cath in 2004 and again in 2015.  She has not had any CP or SOB with her current sx.  She did have a DVT at age 70, none since  No recent medication changes, no fever, chills, cough, rash, vomiting, or any other symptoms that she can think of.    She is on lasix 20 BID, and also K twice a day. She does try to eat foods with plenty of potassium.  States that she has had low potassium for at least a year, "its' always low."   Patient Active Problem List   Diagnosis Date Noted  . Insomnia 11/18/2015  . Epistaxis 11/18/2015  . Normal coronary arteries 05/20/2015  . Chest pain at rest 05/20/2015  . RBBB 05/20/2015  . Abnormal CXR 05/20/2015  . History of MI (myocardial infarction) 05/08/2015  . Bruising 05/08/2015  . Upper airway cough syndrome 02/14/2015  . Asthmatic bronchitis with acute exacerbation 02/06/2015  . Decreased urination 12/28/2014  . Right wrist  injury 11/18/2014  . Hypothyroidism 10/25/2014  . Fatigue 10/25/2014  . 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 Chronic venous insufficiency 10/24/2014  . Varicose veins of leg with complications 01/20/5101  . Degeneration of intervertebral disc of cervical region 06/29/2014  . Palpitations 11/08/2013  . SOB (shortness of breath) 10/03/2013  . DOE (dyspnea on exertion) 10/03/2013  . Edema of both legs 10/03/2013  . Dyspnea 09/25/2013  . Sinusitis, acute 06/17/2013  . Hair loss 11/25/2012  . Falling hair 11/25/2012  . Diverticulitis 07/14/2012  . Hammer toe 06/13/2012  . Recurrent abdominal pain 04/17/2012  . Abdominal pain 01/06/2012  . Hypoglycemia 01/06/2012    Past Medical History:  Diagnosis Date  . Arthritis   . Asthmatic bronchitis with acute exacerbation 02/06/2015  . Colon polyp    . Diverticulitis   . DVT (deep venous thrombosis) ([place])    lower extremity  . H/O cardiac catheterization 2004   Normal coronary arteries  . Heart attack ([place])    10 years ago  . Heart murmur   . History of stress test 05/21/2011  . Hx of echocardiogram 01/27/2010   Normal Ef 55% the transmitral spectral doppler flow pattern is normal for age. the left ventricular wall motion is normal  . Migraines   . Thyroid disease   . TIA (transient ischemic attack)    8 years ago    Past Surgical History:  Procedure Laterality Date  . BREAST BIOPSY     Isaiah Blakes  . CARDIAC CATHETERIZATION     10/2013  . LEFT HEART CATHETERIZATION WITH CORONARY ANGIOGRAM N/A 10/25/2013   Procedure: LEFT HEART CATHETERIZATION WITH CORONARY ANGIOGRAM;  Surgeon: Troy Sine, MD;  Location: [facility] CATH LAB;  Service: Cardiovascular;  Laterality: N/A;  . NECK SURGERY     x's 2  . TOTAL KNEE ARTHROPLASTY     Bilateral x's 2  . VAGINAL HYSTERECTOMY  10/17/1998   Dory Horn    Social History  Substance Use Topics  . Smoking status: Former Smoker    Years: 2.00    Quit date: 04/06/1969  . Smokeless tobacco: Never Used     Comment: socially smoked x 2 years  . Alcohol use No    Family History  Problem Relation Age of Onset  . Arthritis    . Colon cancer Father   . HIV Brother     60  . Kidney cancer Brother   . Colon cancer Brother   . Lung cancer Brother   . Other Brother     Mouth Cancer  . Ovarian cancer Mother   . Uterine cancer Mother   . Prostate cancer Father   . Heart disease Maternal Grandmother   . Stroke Maternal Grandmother   . Hypertension Maternal Grandmother   . Diabetes Maternal Grandmother     Allergies  Allergen Reactions  . Prednisone Other (See Comments)    Homicidal  . Atrovent Hfa [Ipratropium Bromide Hfa] Other (See Comments)    Pt could not sleep  . Cheratussin Ac [Guaifenesin-Codeine]   . Hydrocodone Itching  . Influenza Vaccines Other (See Comments)    Pt reports  heart attack after last flu shot  . Motrin [Ibuprofen] Other (See Comments)    "Gives false reading in blood"    Medication list has been reviewed and updated.  Current Outpatient Prescriptions on File Prior to Visit  Medication Sig Dispense Refill  . Ascorbic Acid (VITAMIN C PO) Take 2 tablets by mouth daily.     . Biotin (  RA BIOTIN) 1000 MCG tablet Take 1,000 mcg by mouth 2 (two) times daily.     Marland Kitchen BREWERS YEAST PO Take 2 tablets by mouth 2 (two) times daily.     . clonazePAM (KLONOPIN) 0.25 MG disintegrating tablet Take 1 tablet (0.25 mg total) by mouth at bedtime. 7 tablet 0  . clopidogrel (PLAVIX) 75 MG tablet TAKE 1 TABLET (75 MG TOTAL) BY MOUTH ONCE. 90 tablet 1  . dicyclomine (BENTYL) 20 MG tablet TAKE 1 TABLET BY MOUTH 4 TIMES A DAY AS NEEDED 360 tablet 1  . estradiol (ESTRACE) 2 MG tablet Take 2 mg by mouth daily.  11  . furosemide (LASIX) 40 MG tablet Take 0.5 tablets (20 mg total) by mouth daily. 60 tablet 3  . lactulose, encephalopathy, (CHRONULAC) 10 GM/15ML SOLN TAKE 30 MLS (20 G TOTAL) BY MOUTH EVERY 6 (SIX) HOURS AS NEEDED.  1  . Multiple Vitamin (MULTIVITAMIN) tablet Take 1 tablet by mouth daily.    . potassium chloride SA (K-DUR,KLOR-CON) 20 MEQ tablet Take 2 tablets (40 mEq total) by mouth daily. 180 tablet 2  . Respiratory Therapy Supplies (FLUTTER) DEVI Use as directed 1 each 0  . SYNTHROID 112 MCG tablet TAKE 1 TABLET (112 MCG TOTAL) BY MOUTH DAILY BEFORE BREAKFAST. 90 tablet 1  . vitamin D, CHOLECALCIFEROL, 400 UNITS tablet Take 400 Units by mouth 2 (two) times daily.      No current facility-administered medications on file prior to visit.     Review of Systems:  As per HPI- otherwise negative.   Physical Examination: Vitals:   12/04/15 1022  BP: 132/84  Pulse: 66  Temp: 98.7 F (37.1 C)    Vitals:   12/04/15 1022  Weight: 135 lb 12.8 oz (61.6 kg)  Height: 5\' 4"  (1.626 m)   Body mass index is 23.31 kg/m. Ideal Body Weight: Weight in (lb) to  have BMI = 25: 145.3  GEN: WDWN, NAD, Non-toxic, A & O x 3, normal weight, looks well HEENT: Atraumatic, Normocephalic. Neck supple. No masses, No LAD.  Bilateral TM wnl, oropharynx normal.  PEERL,EOMI.   Ears and Nose: No external deformity. CV: RRR, No M/G/R. No JVD. No thrill. No extra heart sounds. PULM: CTA B, no wheezes, crackles, rhonchi. No retractions. No resp. distress. No accessory muscle use. ABD: S, NT, ND, +BS. No rebound. No HSM. EXTR: No c/c/e NEURO Normal gait. Negative romberg.  Normal strength, sensation and DTR of all extremities.  Normal facial movement.  However she states that her left facial sensation is decreased/ different from the right side PSYCH: Normally interactive. Conversant. Not depressed or anxious appearing.  Calm demeanor.  No skin lesion or rash on her scalp. She notes moderate tenderness of the left scalp an the left paracervical neck muscles and SCM muscle area.  This pain is definitely reproducible and not suspicious for referred chest pain. Did not note any swelling or masses.  No distention of neck vessels or masses in her neck.    Assessment and Plan: Numbness and tingling of left side of face - Plan: MR Brain Wo Contrast, CT ANGIO NECK W OR WO CONTRAST, Comprehensive metabolic panel, CANCELED: Comprehensive metabolic panel  Neck pain on left side - Plan: MR Brain Wo Contrast, Comprehensive metabolic panel, CANCELED: Comprehensive metabolic panel  Nonintractable headache, unspecified chronicity pattern, unspecified headache type - Plan: CBC, Sed Rate (ESR), C-reactive protein, Comprehensive metabolic panel, CANCELED: Comprehensive metabolic panel  Here today with an episode of panic vs other event/  possible TIA a week ago and subsequent left facial sensation change for 6 days. She also has pain in her left neck.  At this time she does not show any motor deficits or mental status change.  Plan to image her head and neck to looks for any sign of a CVA and  to check the integrity of her neck vessels.   Needed to get stat labs first so these were ordered.  Also did labs to help rule TA, which is less likely.  We planned to get CTA of her neck today and the MRI in the next couple of days  Signed Lamar Blinks, MD  Called with labs at approx 7pm, also CTA neck not done yet as expected.  Pt states that she was offered the opportunity to get both the CT and MRI together this Saturday instead and decided to do that.  Advised that I did want to get the CT of her neck asap to ensure there is no dangerous vascular pathology.  Offered ER evaluation; pt plans to call the med center imaging dept and schedule the CT for tomorrow.  She will let me know if she has any difficulty doing this.  Also discussed labs- no evidence of temporal arteritis and her CRP and sed rate are quite normal.  Her potassium is low- she will increase her Kdur to 20 meq TID instead of BID for the time being.  Plan to have her CT done tomorrow and I will follow-up with her  Results for orders placed or performed in visit on 12/04/15  CBC  Result Value Ref Range   WBC 4.3 4.0 - 10.5 K/uL   RBC 4.34 3.87 - 5.11 Mil/uL   Platelets 188.0 150.0 - 400.0 K/uL   Hemoglobin 14.2 12.0 - 15.0 g/dL   HCT 41.6 36.0 - 46.0 %   MCV 95.8 78.0 - 100.0 fl   MCHC 34.2 30.0 - 36.0 g/dL   RDW 13.2 11.5 - 15.5 %  Sed Rate (ESR)  Result Value Ref Range   Sed Rate 6 0 - 30 mm/hr  C-reactive protein  Result Value Ref Range   CRP 0.2 (L) 0.5 - 20.0 mg/dL  Comprehensive metabolic panel  Result Value Ref Range   Sodium 140 135 - 145 mEq/L   Potassium 3.1 (L) 3.5 - 5.1 mEq/L   Chloride 100 96 - 112 mEq/L   CO2 35 (H) 19 - 32 mEq/L   Glucose, Bld 92 70 - 99 mg/dL   BUN 12 6 - 23 mg/dL   Creatinine, Ser 0.79 0.40 - 1.20 mg/dL   Total Bilirubin 0.6 0.2 - 1.2 mg/dL   Alkaline Phosphatase 62 39 - 117 U/L   AST 22 0 - 37 U/L   ALT 19 0 - 35 U/L   Total Protein 7.5 6.0 - 8.3 g/dL   Albumin 4.2 3.5 - 5.2  g/dL   Calcium 8.9 8.4 - 10.5 mg/dL   GFR 76.26 >60.00 mL/min   Called and discussed with pt on 9/7- her MRI is benign. She feels a little better since increasing her K to TID and will come in for a lab visit in the next week to recheck this level. I will refer to neurology to discuss her facial sensation symptoms. She reports no change in her sx- no slurred speech or difficulty speaking, but she continues to feel like the left side of her face feels different than the right. No fever, no other symptoms that she can think  of, no significant HA.

## 2015-12-04 NOTE — Assessment & Plan Note (Signed)
F/u GI Pt has meds per orders

## 2015-12-05 ENCOUNTER — Ambulatory Visit (HOSPITAL_BASED_OUTPATIENT_CLINIC_OR_DEPARTMENT_OTHER)
Admission: RE | Admit: 2015-12-05 | Discharge: 2015-12-05 | Disposition: A | Discharge status: Home / Self Care | Payer: Medicare Other | Source: Ambulatory Visit | Attending: Family Medicine | Admitting: Family Medicine

## 2015-12-05 DIAGNOSIS — I700 Atherosclerosis of aorta: Secondary | ICD-10-CM | POA: Insufficient documentation

## 2015-12-05 DIAGNOSIS — M50321 Other cervical disc degeneration at C4-C5 level: Secondary | ICD-10-CM | POA: Insufficient documentation

## 2015-12-05 DIAGNOSIS — R202 Paresthesia of skin: Secondary | ICD-10-CM

## 2015-12-05 DIAGNOSIS — R200 Anesthesia of skin: Secondary | ICD-10-CM | POA: Insufficient documentation

## 2015-12-05 DIAGNOSIS — I6522 Occlusion and stenosis of left carotid artery: Secondary | ICD-10-CM | POA: Insufficient documentation

## 2015-12-05 DIAGNOSIS — I6501 Occlusion and stenosis of right vertebral artery: Secondary | ICD-10-CM | POA: Diagnosis not present

## 2015-12-05 MED ORDER — IOPAMIDOL (ISOVUE-370) INJECTION 76%
100.0000 mL | Freq: Once | INTRAVENOUS | Status: AC | PRN
  Administered 2015-12-05: 100 mL via INTRAVENOUS

## 2015-12-06 ENCOUNTER — Encounter: Payer: Self-pay | Admitting: Family Medicine

## 2015-12-07 ENCOUNTER — Ambulatory Visit (HOSPITAL_BASED_OUTPATIENT_CLINIC_OR_DEPARTMENT_OTHER)
Admission: RE | Admit: 2015-12-07 | Discharge: 2015-12-07 | Disposition: A | Discharge status: Home / Self Care | Payer: Medicare Other | Source: Ambulatory Visit | Attending: Family Medicine | Admitting: Family Medicine

## 2015-12-07 DIAGNOSIS — I739 Peripheral vascular disease, unspecified: Secondary | ICD-10-CM | POA: Diagnosis not present

## 2015-12-07 DIAGNOSIS — R4182 Altered mental status, unspecified: Secondary | ICD-10-CM | POA: Diagnosis not present

## 2015-12-07 DIAGNOSIS — R202 Paresthesia of skin: Secondary | ICD-10-CM

## 2015-12-07 DIAGNOSIS — M542 Cervicalgia: Secondary | ICD-10-CM | POA: Diagnosis not present

## 2015-12-07 DIAGNOSIS — R200 Anesthesia of skin: Secondary | ICD-10-CM | POA: Diagnosis not present

## 2015-12-12 NOTE — Addendum Note (Signed)
Addended by: Lamar Blinks C on: 12/12/2015 08:43 AM   Modules accepted: Orders

## 2015-12-13 LAB — LIPID PANEL
Cholesterol: 169 mg/dL (ref 0–200)
HDL: 73 mg/dL — AB (ref 35–70)
LDL Cholesterol: 83 mg/dL
TRIGLYCERIDES: 65 mg/dL (ref 40–160)

## 2015-12-13 LAB — BASIC METABOLIC PANEL: GLUCOSE: 106 mg/dL

## 2015-12-24 ENCOUNTER — Ambulatory Visit: Payer: Self-pay | Admitting: Neurology

## 2015-12-27 ENCOUNTER — Other Ambulatory Visit: Payer: Self-pay | Admitting: Family Medicine

## 2015-12-27 NOTE — Telephone Encounter (Signed)
Rx prescribed By [facility] with different directions. Please review and advise     KP

## 2015-12-29 ENCOUNTER — Encounter: Payer: Self-pay | Admitting: Family Medicine

## 2015-12-29 NOTE — Patient Instructions (Signed)
Diverticulitis °Diverticulitis is inflammation or infection of small pouches in your colon that form when you have a condition called diverticulosis. The pouches in your colon are called diverticula. Your colon, or large intestine, is where water is absorbed and stool is formed. °Complications of diverticulitis can include: °· Bleeding. °· Severe infection. °· Severe pain. °· Perforation of your colon. °· Obstruction of your colon. °CAUSES  °Diverticulitis is caused by bacteria. °Diverticulitis happens when stool becomes trapped in diverticula. This allows bacteria to grow in the diverticula, which can lead to inflammation and infection. °RISK FACTORS °People with diverticulosis are at risk for diverticulitis. Eating a diet that does not include enough fiber from fruits and vegetables may make diverticulitis more likely to develop. °SYMPTOMS  °Symptoms of diverticulitis may include: °· Abdominal pain and tenderness. The pain is normally located on the left side of the abdomen, but may occur in other areas. °· Fever and chills. °· Bloating. °· Cramping. °· Nausea. °· Vomiting. °· Constipation. °· Diarrhea. °· Blood in your stool. °DIAGNOSIS  °Your health care provider will ask you about your medical history and do a physical exam. You may need to have tests done because many medical conditions can cause the same symptoms as diverticulitis. Tests may include: °· Blood tests. °· Urine tests. °· Imaging tests of the abdomen, including X-rays and CT scans. °When your condition is under control, your health care provider may recommend that you have a colonoscopy. A colonoscopy can show how severe your diverticula are and whether something else is causing your symptoms. °TREATMENT  °Most cases of diverticulitis are mild and can be treated at home. Treatment may include: °· Taking over-the-counter pain medicines. °· Following a clear liquid diet. °· Taking antibiotic medicines by mouth for 7-10 days. °More severe cases may  be treated at a hospital. Treatment may include: °· Not eating or drinking. °· Taking prescription pain medicine. °· Receiving antibiotic medicines through an IV tube. °· Receiving fluids and nutrition through an IV tube. °· Surgery. °HOME CARE INSTRUCTIONS  °· Follow your health care provider's instructions carefully. °· Follow a full liquid diet or other diet as directed by your health care provider. After your symptoms improve, your health care provider may tell you to change your diet. He or she may recommend you eat a high-fiber diet. Fruits and vegetables are good sources of fiber. Fiber makes it easier to pass stool. °· Take fiber supplements or probiotics as directed by your health care provider. °· Only take medicines as directed by your health care provider. °· Keep all your follow-up appointments. °SEEK MEDICAL CARE IF:  °· Your pain does not improve. °· You have a hard time eating food. °· Your bowel movements do not return to normal. °SEEK IMMEDIATE MEDICAL CARE IF:  °· Your pain becomes worse. °· Your symptoms do not get better. °· Your symptoms suddenly get worse. °· You have a fever. °· You have repeated vomiting. °· You have bloody or black, tarry stools. °MAKE SURE YOU:  °· Understand these instructions. °· Will watch your condition. °· Will get help right away if you are not doing well or get worse. °  °This information is not intended to replace advice given to you by your health care provider. Make sure you discuss any questions you have with your health care provider. °  °Document Released: 12/31/2004 Document Revised: 03/28/2013 Document Reviewed: 02/15/2013 °Elsevier Interactive Patient Education ©2016 [employer] ° °

## 2015-12-31 ENCOUNTER — Ambulatory Visit (INDEPENDENT_AMBULATORY_CARE_PROVIDER_SITE_OTHER): Payer: Medicare Other | Admitting: Neurology

## 2015-12-31 ENCOUNTER — Encounter: Payer: Self-pay | Admitting: Neurology

## 2015-12-31 VITALS — BP 151/83 | HR 68 | Ht 63.0 in | Wt 135.6 lb

## 2015-12-31 DIAGNOSIS — H538 Other visual disturbances: Secondary | ICD-10-CM | POA: Diagnosis not present

## 2015-12-31 DIAGNOSIS — M316 Other giant cell arteritis: Secondary | ICD-10-CM | POA: Diagnosis not present

## 2015-12-31 NOTE — Progress Notes (Signed)
[facility]    Provider:  Dr Lucia Gaskins Referring Provider: Zola Button, Grayling Congress, * Primary Care Physician:  Donato Schultz, DO  CC:  Numbness after panic attack  HPI:  Alexandria [name] is a 70 y.o. female here as a referral from Dr. Zola Button for numbness on the left side after panic attacks that radiates to the neck and shoulder. Past medical history of migraine, heart disease, thyroid disease, TIA, chronic venous insufficiency, fatigue, insomnia. She has tingling in the left side of the face. This started in August out of the blue, she was singing and she had a panic attack.  She panicked, couldn't do anything. No alteration of awareness. Lasted for 20-25 minutes then she was ok. Symptoms of left-sided numbness, tingling started during the panic attack. It has improved. Right now she is feeling it on the left side of the whole face and feels tingling. No recent migraines, has been years since headache. Vision changes on the left eye and she is going to see Dr. Hazle Quant. She has been to the dentist. The tinlging is annoying. Her Bp is elevated she is normally 120s/70s and today 150s/80s. Advised to watch BP and increased BP can cause paresthesias. She has blurry vision in the left eye. Never had symptoms before. No focal weakness, no headache, no difficulties talking or walking, otherwise no changes or other focal neurologic deficits. No other associated symptoms or modifying factors.   She has been having anxiety.                                                                                                                                                                                                                             Reviewed notes, labs and imaging from outside physicians, which showed: Reviewed notes in Epic. Patient reported that she had a "panic attack" last month patient was in a singing group and felt like she was frozen and went blank which lasted for a  few seconds. She was unable to sing. No loss of consciousness. No alteration of awareness. She was able to join back in and completed the practice session. We have she felt very tired. The next day she felt her left face feeling numb and tingly. This persisted. Radiates into her head and neck. No facial drooping, slurred speech, difficulty speaking or swallowing, no headaches and changes in her vision or hearing, no trauma to her head or neck recently. Patient has  a history of panic attacks, remote panic attacks. Patient had a TIA 11 years ago in , she lost memory for several days then knows that she was in the hospital and recovered fully. No residual symptoms. She had an MI 13-14 years ago, cardiac cath in 2004 and again in 2015. She did not complain of any chest pain or shortness of breath with her symptoms.   MRI brain 12/07/2015: Personally reviewed images and agree with the following: Major intracranial vascular flow voids appear stable since 2004. Cerebral volume has not significantly changed. Cavum septum pellucidum (normal variant). No restricted diffusion to suggest acute infarction. No midline shift, mass effect, evidence of mass lesion, ventriculomegaly, extra-axial collection or acute intracranial hemorrhage. Cervicomedullary junction and pituitary are within normal limits.  Scattered small mostly subcortical white matter foci of T2 and FLAIR hyperintensity which are nonspecific. These are progressed since 2004 and the extent is mild to moderate for age. No cortical encephalomalacia or chronic cerebral blood products. Deep gray matter nuclei, brainstem, an the cerebellum are normal.  Visible internal auditory structures appear normal. Visualized paranasal sinuses and mastoids are stable and well pneumatized. Negative orbit and scalp soft tissues. Normal bone marrow signal. Negative visualized cervical spine.  IMPRESSION: 1.  No acute intracranial abnormality. 2.  Mild to moderate for age nonspecific cerebral white matter signal changes, most commonly due to chronic small vessel disease.  CTA of the neck 12/05/2015:  Skeleton: Sequelae of previous C5 through C7 cervical fusion. Upper cervical facet degeneration, severe on the left at C4-C5. No acute osseous abnormality identified.  Visualized paranasal sinuses and mastoids are stable and well pneumatized.  Upper chest: Mild subpleural scarring in the visible lungs. No superior mediastinal lymphadenopathy.  Other neck: Absent thyroid. Negative larynx, pharynx, parapharyngeal spaces, retropharyngeal space, sublingual space, submandibular glands (the right appears partially atrophic) and parotid glands. No cervical lymphadenopathy.  Grossly stable and negative visualized brain parenchyma. Visualized orbits and scalp soft tissues are within normal limits.  Aortic arch: 3 vessel arch configuration. Mild to moderate calcified arch atherosclerosis.  Right carotid system: Mildly tortuous proximal right CCA. Minimal calcified plaque at the posterior right ICA origin. Negative cervical right ICA aside from mild tortuosity.  Visible right ICA siphon and terminus are patent with calcified atherosclerosis.  Left carotid system: Mildly tortuous proximal left CCA. Calcified plaque at the left carotid bifurcation resulting in less than 50 % stenosis with respect to the distal vessel. Mildly tortuous but otherwise negative cervical left ICA.  Visible left ICA siphon and terminus are patent with calcified atherosclerosis.  Vertebral arteries:No proximal right subclavian artery stenosis. Normal right vertebral artery origin. Mildly dominant right vertebral artery is normal to the skullbase. The right PICA origin is patent. There is right V4 segment calcified plaque resulting in mild to moderate stenosis. Patent vertebrobasilar junction. Negative visible basilar artery.  No proximal left  subclavian artery stenosis despite calcified plaque. Normal left vertebral artery origin. Negative left vertebral artery in the neck. The left vertebral is mildly non dominant. Normal left PICA origin. No distal left vertebral stenosis.  IMPRESSION: 1. No dissection or acute arterial finding identified in the neck. 2. Mild cervical carotid atherosclerosis, primarily on the left, will without stenosis. More moderate visible ICA siphon and aortic calcified atherosclerosis. 3. No vertebral artery atherosclerosis or stenosis in the neck. There is mild to moderate distal right vertebral artery stenosis related to calcified plaque. 4. No acute findings identified in the neck. Previous cervical fusion. Severe left cervical  facet degeneration maximal at C4-C5.  Review of Systems: Patient complains of symptoms per HPI as well as the following symptoms: Palpitations, blurred vision, easy bruising, joint pain, joint swelling, ringing in the ears, constipation, allergies, confusion. Pertinent negatives per HPI. All others negative.   Social History   Social History  . Marital status: Married    Spouse name: [name]  . Number of children: 3  . Years of education: [employer]   Occupational History  . DISABLED Unemployed   Social History Main Topics  . Smoking status: Former Smoker    Years: 2.00    Quit date: 04/06/1969  . Smokeless tobacco: Never Used     Comment: socially smoked x 2 years  . Alcohol use No  . Drug use: No  . Sexual activity: Not on file   Other Topics Concern  . Not on file   Social History Narrative   Lives with husband [name]   Caffeine use: coffee (2 cups per day)    Family History  Problem Relation Age of Onset  . Arthritis    . Colon cancer Father   . Prostate cancer Father   . HIV Brother     41  . Kidney cancer Brother   . Colon cancer Brother   . Lung cancer Brother   . Other Brother     Mouth Cancer  . Ovarian cancer Mother   . Uterine cancer  Mother   . Heart disease Maternal Grandmother   . Stroke Maternal Grandmother   . Hypertension Maternal Grandmother   . Diabetes Maternal Grandmother     Past Medical History:  Diagnosis Date  . Arthritis   . Asthmatic bronchitis with acute exacerbation 02/06/2015  . Colon polyp   . Diverticulitis   . DVT (deep venous thrombosis) ([place])    lower extremity  . H/O cardiac catheterization 2004   Normal coronary arteries  . Heart attack ([place])    10 years ago  . Heart murmur   . History of stress test 05/21/2011  . Hx of echocardiogram 01/27/2010   Normal Ef 55% the transmitral spectral doppler flow pattern is normal for age. the left ventricular wall motion is normal  . Migraines   . Thyroid disease   . TIA (transient ischemic attack)    8 years ago    Past Surgical History:  Procedure Laterality Date  . BREAST BIOPSY     Isaiah Blakes  . CARDIAC CATHETERIZATION     10/2013  . LEFT HEART CATHETERIZATION WITH CORONARY ANGIOGRAM N/A 10/25/2013   Procedure: LEFT HEART CATHETERIZATION WITH CORONARY ANGIOGRAM;  Surgeon: Troy Sine, MD;  Location: [facility] CATH LAB;  Service: Cardiovascular;  Laterality: N/A;  . NECK SURGERY     x's 2  . TOTAL KNEE ARTHROPLASTY     Bilateral x's 2  . VAGINAL HYSTERECTOMY  10/17/1998   Dory Horn    Current Outpatient Prescriptions  Medication Sig Dispense Refill  . Ascorbic Acid (VITAMIN C PO) Take 1 Dose by mouth 4 (four) times daily.    . Biotin (RA BIOTIN) 1000 MCG tablet Take 1,000 mcg by mouth 2 (two) times daily.     Marland Kitchen BREWERS YEAST PO Take 2 tablets by mouth 2 (two) times daily.     . clonazePAM (KLONOPIN) 0.25 MG disintegrating tablet Take 1 tablet (0.25 mg total) by mouth at bedtime. 7 tablet 0  . clopidogrel (PLAVIX) 75 MG tablet TAKE 1 TABLET (75 MG TOTAL) BY MOUTH ONCE. 90 tablet 1  .  dicyclomine (BENTYL) 20 MG tablet TAKE 1 TABLET BY MOUTH 4 TIMES A DAY AS NEEDED 360 tablet 1  . estradiol (ESTRACE) 2 MG tablet Take 2 mg by mouth daily.  11    . furosemide (LASIX) 40 MG tablet TAKE 1 TABLET (40 MG TOTAL) BY MOUTH 2 (TWO) TIMES DAILY. 60 tablet 3  . Multiple Vitamin (MULTIVITAMIN) tablet Take 1 tablet by mouth daily.    . potassium chloride SA (K-DUR,KLOR-CON) 20 MEQ tablet Take 2 tablets (40 mEq total) by mouth daily. 180 tablet 2  . Respiratory Therapy Supplies (FLUTTER) DEVI Use as directed 1 each 0  . SYNTHROID 112 MCG tablet TAKE 1 TABLET (112 MCG TOTAL) BY MOUTH DAILY BEFORE BREAKFAST. 90 tablet 1  . vitamin D, CHOLECALCIFEROL, 400 UNITS tablet Take 400 Units by mouth 2 (two) times daily.      No current facility-administered medications for this visit.     Allergies as of 12/31/2015 - Review Complete 12/31/2015  Allergen Reaction Noted  . Prednisone Other (See Comments) 12/18/2011  . Atrovent hfa [ipratropium bromide hfa] Other (See Comments) 02/14/2015  . Cheratussin ac [guaifenesin-codeine]  01/29/2015  . Hydrocodone Itching 09/28/2012  . Influenza vaccines Other (See Comments) 01/30/2015  . Motrin [ibuprofen] Other (See Comments) 10/23/2013    Vitals: BP (!) 151/83 (BP Location: Right Arm, Patient Position: Sitting, Cuff Size: Normal)   Pulse 68   Ht '5\' 3"'$  (1.6 m)   Wt 135 lb 9.6 oz (61.5 kg)   BMI 24.02 kg/m  Last Weight:  Wt Readings from Last 1 Encounters:  12/31/15 135 lb 9.6 oz (61.5 kg)   Last Height:   Ht Readings from Last 1 Encounters:  12/31/15 '5\' 3"'$  (1.6 m)    Physical exam: Exam: Gen: NAD, conversant, well nourised, obese, well groomed                     CV: RRR, no MRG. No Carotid Bruits. No peripheral edema, warm, nontender Eyes: Conjunctivae clear without exudates or hemorrhage  Neuro: Detailed Neurologic Exam  Speech:    Speech is normal; fluent and spontaneous with normal comprehension.  Cognition:    The patient is oriented to person, place, and time;     recent and remote memory intact;     language fluent;     normal attention, concentration,     fund of knowledge Cranial  Nerves:    The pupils are equal, round, and reactive to light. The fundi are normal and spontaneous venous pulsations are present. Visual fields are full to finger confrontation. Extraocular movements are intact. Splits midline to pin prick and vibration, the muscles of mastication are normal. The face is symmetric. The palate elevates in the midline. Hearing intact. Voice is normal. Shoulder shrug is normal. The tongue has normal motion without fasciculations.   Coordination:    No dysmetria  Gait:    Normal native gait  Motor Observation:    No asymmetry, no atrophy, and no involuntary movements noted. Tone:    Normal muscle tone.    Posture:    Posture is normal. normal erect    Strength: Giveway throughout but appears symmetric and normal       Sensation: intact to LT      Reflex Exam:  DTR's: Absent AJs, she declined patellars, normal uppers.    Toes:    The toes are downgoing bilaterally.   Clonus:    Clonus is absent.      Assessment/Plan:  Patient with left facial numbness, tingling in the setting of a panic attack and recent stress. MRI of the brain and CTA of the neck normal. Her blood pressure has been elevated which may cause paresthesias. Patient splits midline on the forehead with pin prick and vibration suspect anxiety component, MRI of the brain and CTA of the head without etiology. Reassured patient. Will get an esr/crp to eval left eye blurry vision and r/o temporal arteritis however no other symptoms or signs and likelihood is very low.   CC: [name], DO   Sarina Ill, MD  [facility] [address] [place], [place] [address]  Phone [phone] Fax [phone]

## 2015-12-31 NOTE — Patient Instructions (Addendum)
Remember to drink plenty of fluid, eat healthy meals and do not skip any meals. Try to eat protein with a every meal and eat a healthy snack such as fruit or nuts in between meals. Try to keep a regular sleep-wake schedule and try to exercise daily, particularly in the form of walking, 20-30 minutes a day, if you can.   I would like to see you back if symptoms do not improve in 2-3 months, sooner if we need to. Please call us with any interim questions, concerns, problems, updates or refill requests.   Our phone number is [phone]. We also have an after hours call service for urgent matters and there is a physician on-call for urgent questions. For any emergencies you know to call 911 or go to the nearest emergency room

## 2016-01-01 ENCOUNTER — Telehealth: Payer: Self-pay | Admitting: *Deleted

## 2016-01-01 ENCOUNTER — Encounter: Payer: Self-pay | Admitting: Family Medicine

## 2016-01-01 ENCOUNTER — Ambulatory Visit (INDEPENDENT_AMBULATORY_CARE_PROVIDER_SITE_OTHER): Payer: Medicare Other | Admitting: Family Medicine

## 2016-01-01 VITALS — BP 147/84 | HR 76 | Temp 97.8°F | Ht 63.0 in | Wt 137.6 lb

## 2016-01-01 DIAGNOSIS — E876 Hypokalemia: Secondary | ICD-10-CM

## 2016-01-01 DIAGNOSIS — R200 Anesthesia of skin: Secondary | ICD-10-CM | POA: Diagnosis not present

## 2016-01-01 DIAGNOSIS — R202 Paresthesia of skin: Secondary | ICD-10-CM

## 2016-01-01 LAB — BASIC METABOLIC PANEL
BUN: 15 mg/dL (ref 6–23)
CALCIUM: 8.7 mg/dL (ref 8.4–10.5)
CHLORIDE: 100 meq/L (ref 96–112)
CO2: 33 meq/L — AB (ref 19–32)
CREATININE: 0.79 mg/dL (ref 0.40–1.20)
GFR: 76.24 mL/min (ref >60.00)
Glucose, Bld: 83 mg/dL (ref 70–99)
Potassium: 3.2 mEq/L — ABNORMAL LOW (ref 3.5–5.1)
Sodium: 140 mEq/L (ref 135–145)

## 2016-01-01 LAB — C-REACTIVE PROTEIN: CRP: 1.3 mg/L (ref 0.0–4.9)

## 2016-01-01 LAB — SEDIMENTATION RATE: Sed Rate: 2 mm/hr (ref 0–40)

## 2016-01-01 NOTE — Progress Notes (Signed)
Pre visit review using our clinic review tool, if applicable. No additional management support is needed unless otherwise documented below in the visit note. 

## 2016-01-01 NOTE — Progress Notes (Addendum)
[place] [facility] [address] [place], [place] [address] [phone]  Date:  01/01/2016   Name:  Alexandria [name]   DOB:  09/07/1945   MRN:  [id number]  PCP:  [name], DO    Chief Complaint: Follow-up (Pt here for f/u visit. Pt was seen by neurology 12/31/15. Pt states that sx's are still the same as last visit. pt declined flu vaccine)   History of Present Illness:  Alexandria [name] is a 70 y.o. very pleasant female patient who presents with the following:  Here today fora follow-up visit.  I saw her on 8/30 with numbness of left face and hypokalemia. We did an MRI that looked fine and referred her to see neurology. She just saw neurology yesterday- they did check an ESR and CRP to rule out TA, but otherwise eval was benign. ESR and CRP quite normal.  She does need to recheck her K today.    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  She does have some pain still in her left face.   They had discussed steroids, but she does not want to use these at least for now. She is just dealing with the pain  BP Readings from Last 3 Encounters:  01/01/16 (!) 147/84  12/31/15 (!) 151/83  12/04/15 132/84   She did do "lifeline screening" recently.  They did an EKG and various labs that we will abstract as needed  She is taking her K TID right now- this does seem to be helping her, at least she feels better  She is on lasix for swelling but notes that even when she stopped lasix temporarily her hypokalemia continued.  Discussed addition of a K sparing diuretic and she might be interested in trying this   Patient Active Problem List   Diagnosis Date Noted  . Insomnia 11/18/2015  . Epistaxis 11/18/2015  . Normal coronary arteries 05/20/2015  . Chest pain at rest 05/20/2015  . RBBB 05/20/2015  . Abnormal CXR 05/20/2015  . History of MI (myocardial infarction) 05/08/2015  . Bruising 05/08/2015  . Upper  airway cough syndrome 02/14/2015  . Asthmatic bronchitis with acute exacerbation 02/06/2015  . Decreased urination 12/28/2014  . Right wrist injury 11/18/2014  . Hypothyroidism 10/25/2014  . Fatigue 10/25/2014  . 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 Chronic venous insufficiency 10/24/2014  . Varicose veins of leg with complications 29/52/8413  . Degeneration of intervertebral disc of cervical region 06/29/2014  . Palpitations 11/08/2013  . SOB (shortness of breath) 10/03/2013  . DOE (dyspnea on exertion) 10/03/2013  . Edema of both legs 10/03/2013  . Dyspnea 09/25/2013  . Sinusitis, acute 06/17/2013  . Hair loss 11/25/2012  . Falling hair 11/25/2012  . Diverticulitis 07/14/2012  . Hammer toe 06/13/2012  . Recurrent abdominal pain 04/17/2012  . Abdominal pain 01/06/2012  . Hypoglycemia 01/06/2012    Past Medical History:  Diagnosis Date  . Arthritis   . Asthmatic bronchitis with acute exacerbation 02/06/2015  . Colon polyp   . Diverticulitis   . DVT (deep venous thrombosis) ([place])    lower extremity  . H/O cardiac catheterization 2004   Normal coronary arteries  . Heart attack ([place])    10 years ago  . Heart murmur   . History of stress test 05/21/2011  . Hx of echocardiogram 01/27/2010   Normal Ef 55% the transmitral spectral doppler flow pattern is normal for age. the  left ventricular wall motion is normal  . Migraines   . Thyroid disease   . TIA (transient ischemic attack)    8 years ago    Past Surgical History:  Procedure Laterality Date  . BREAST BIOPSY     Isaiah Blakes  . CARDIAC CATHETERIZATION     10/2013  . LEFT HEART CATHETERIZATION WITH CORONARY ANGIOGRAM N/A 10/25/2013   Procedure: LEFT HEART CATHETERIZATION WITH CORONARY ANGIOGRAM;  Surgeon: Troy Sine, MD;  Location: [facility] CATH LAB;  Service: Cardiovascular;  Laterality: N/A;  . NECK SURGERY     x's 2  . TOTAL KNEE ARTHROPLASTY     Bilateral x's 2  . VAGINAL HYSTERECTOMY  10/17/1998   Dory Horn     Social History  Substance Use Topics  . Smoking status: Former Smoker    Years: 2.00    Quit date: 04/06/1969  . Smokeless tobacco: Never Used     Comment: socially smoked x 2 years  . Alcohol use No    Family History  Problem Relation Age of Onset  . Arthritis    . Colon cancer Father   . Prostate cancer Father   . HIV Brother     23  . Kidney cancer Brother   . Colon cancer Brother   . Lung cancer Brother   . Other Brother     Mouth Cancer  . Ovarian cancer Mother   . Uterine cancer Mother   . Heart disease Maternal Grandmother   . Stroke Maternal Grandmother   . Hypertension Maternal Grandmother   . Diabetes Maternal Grandmother     Allergies  Allergen Reactions  . Prednisone Other (See Comments)    Homicidal  . Atrovent Hfa [Ipratropium Bromide Hfa] Other (See Comments)    Pt could not sleep  . Cheratussin Ac [Guaifenesin-Codeine]   . Hydrocodone Itching  . Influenza Vaccines Other (See Comments)    Pt reports heart attack after last flu shot  . Motrin [Ibuprofen] Other (See Comments)    "Gives false reading in blood"    Medication list has been reviewed and updated.  Current Outpatient Prescriptions on File Prior to Visit  Medication Sig Dispense Refill  . Ascorbic Acid (VITAMIN C PO) Take 1 Dose by mouth 4 (four) times daily.    . Biotin (RA BIOTIN) 1000 MCG tablet Take 1,000 mcg by mouth 2 (two) times daily.     Marland Kitchen BREWERS YEAST PO Take 2 tablets by mouth 2 (two) times daily.     . clonazePAM (KLONOPIN) 0.25 MG disintegrating tablet Take 1 tablet (0.25 mg total) by mouth at bedtime. 7 tablet 0  . clopidogrel (PLAVIX) 75 MG tablet TAKE 1 TABLET (75 MG TOTAL) BY MOUTH ONCE. 90 tablet 1  . dicyclomine (BENTYL) 20 MG tablet TAKE 1 TABLET BY MOUTH 4 TIMES A DAY AS NEEDED 360 tablet 1  . estradiol (ESTRACE) 2 MG tablet Take 2 mg by mouth daily.  11  . furosemide (LASIX) 40 MG tablet TAKE 1 TABLET (40 MG TOTAL) BY MOUTH 2 (TWO) TIMES DAILY. 60 tablet 3  .  Multiple Vitamin (MULTIVITAMIN) tablet Take 1 tablet by mouth daily.    . potassium chloride SA (K-DUR,KLOR-CON) 20 MEQ tablet Take 2 tablets (40 mEq total) by mouth daily. (Patient taking differently: Take 3 tablets (60 mEq total) by mouth daily.) 180 tablet 2  . SYNTHROID 112 MCG tablet TAKE 1 TABLET (112 MCG TOTAL) BY MOUTH DAILY BEFORE BREAKFAST. 90 tablet 1  . vitamin D, CHOLECALCIFEROL,  400 UNITS tablet Take 400 Units by mouth 2 (two) times daily.      No current facility-administered medications on file prior to visit.     Review of Systems:  As per HPI- otherwise negative.   Physical Examination: Vitals:   01/01/16 1036  BP: (!) 147/84  Pulse: 76  Temp: 97.8 F (36.6 C)   Vitals:   01/01/16 1036  Weight: 137 lb 9.6 oz (62.4 kg)  Height: '5\' 3"'$  (1.6 m)   Body mass index is 24.37 kg/m. Ideal Body Weight: Weight in (lb) to have BMI = 25: 140.8  GEN: WDWN, NAD, Non-toxic, A & O x 3, normal weight, looks well HEENT: Atraumatic, Normocephalic. Neck supple. No masses, No LAD. Ears and Nose: No external deformity. CV: RRR, No M/G/R. No JVD. No thrill. No extra heart sounds. PULM: CTA B, no wheezes, crackles, rhonchi. No retractions. No resp. distress. No accessory muscle use. EXTR: No c/c/e NEURO Normal gait.  PSYCH: Normally interactive. Conversant. Not depressed or anxious appearing.  Calm demeanor.    Assessment and Plan: Hypokalemia - Plan: Basic metabolic panel  Numbness and tingling of left side of face   Here today to recheck left sided facial pain and hypokalemia Her working dx right now seems to be trigeminal neuralgia, which she is tolerating She is on on chronic diuretic therapy for swelling- will check K today and consider addition of spiro Will plan further follow- up pending labs.   Signed Lamar Blinks, MD  Received her labs and called on 10/1 Results for orders placed or performed in visit on 33/54/56  Basic metabolic panel  Result Value Ref  Range   Sodium 140 135 - 145 mEq/L   Potassium 3.2 (L) 3.5 - 5.1 mEq/L   Chloride 100 96 - 112 mEq/L   CO2 33 (H) 19 - 32 mEq/L   Glucose, Bld 83 70 - 99 mg/dL   BUN 15 6 - 23 mg/dL   Creatinine, Ser 0.79 0.40 - 1.20 mg/dL   Calcium 8.7 8.4 - 10.5 mg/dL   GFR 76.24 >60.00 mL/min   Her potassium continues to be low despite using 20 meq of K TID.  Renal function is normal.  Suspect this is due to chronic lasix use.  Called her but did not reach, will try back Called her back on 10/2 and reached her;  She is taking lasix at 40 BID Will exchange her 2nd daily dose of lasix for 25 of spironolactone and have her stop using K; she may take 20 of kcl only during the first 2 days of spiro use.  Will need a BMP within 5 days of change. She understands and agrees with the plan She will come in for a K check within 4-5 days of making this change

## 2016-01-01 NOTE — Patient Instructions (Signed)
We will check on your potassium today- if still low we may want to consider adding some spironolactone which is a potassium sparing diuretic.  We will be in touch!

## 2016-01-01 NOTE — Telephone Encounter (Signed)
LVM for pt about normal labs per Dr Jaynee Eagles. Gave [facility] phone number if she has further questions.

## 2016-01-01 NOTE — Telephone Encounter (Signed)
-----   Message from Melvenia Beam, MD sent at 01/01/2016 10:41 AM EDT ----- Labs normal thanks

## 2016-01-02 ENCOUNTER — Other Ambulatory Visit: Payer: Self-pay | Admitting: Family Medicine

## 2016-01-03 ENCOUNTER — Other Ambulatory Visit: Payer: Self-pay

## 2016-01-03 DIAGNOSIS — H04123 Dry eye syndrome of bilateral lacrimal glands: Secondary | ICD-10-CM | POA: Diagnosis not present

## 2016-01-03 DIAGNOSIS — H2513 Age-related nuclear cataract, bilateral: Secondary | ICD-10-CM | POA: Diagnosis not present

## 2016-01-03 DIAGNOSIS — H524 Presbyopia: Secondary | ICD-10-CM | POA: Diagnosis not present

## 2016-01-03 MED ORDER — FUROSEMIDE 40 MG PO TABS: ORAL_TABLET | ORAL | 1 refills | Status: DC

## 2016-01-06 MED ORDER — SPIRONOLACTONE 25 MG PO TABS: 25.0000 mg | ORAL_TABLET | Freq: Every day | ORAL | 2 refills | Status: DC

## 2016-01-06 NOTE — Addendum Note (Signed)
Addended by: Lamar Blinks C on: 01/06/2016 05:29 PM   Modules accepted: Orders

## 2016-01-07 ENCOUNTER — Telehealth: Payer: Self-pay | Admitting: Family Medicine

## 2016-01-07 NOTE — Telephone Encounter (Signed)
Please advise      KP 

## 2016-01-07 NOTE — Telephone Encounter (Signed)
Pharmacy is calling to let us know that there is a drug interaction with spironolactone (ALDACTONE) 25 MG tablet and potassium chloride SA (K-DUR,KLOR-CON) 20 MEQ tablet  Please advise  Pharmacy Contact: [phone]

## 2016-01-09 ENCOUNTER — Telehealth: Payer: Self-pay | Admitting: Family Medicine

## 2016-01-09 ENCOUNTER — Other Ambulatory Visit: Payer: Self-pay | Admitting: Family Medicine

## 2016-01-09 ENCOUNTER — Other Ambulatory Visit: Payer: Self-pay | Admitting: Emergency Medicine

## 2016-01-09 NOTE — Telephone Encounter (Signed)
/  [id number] - [place], [place] - [address] AT [place] [phone] (Phone) [phone] (Fax)   .   Pharmacy in need of clarification regarding direction potassium chloride SA (K-DUR,KLOR-CON) 20 MEQ tablet

## 2016-01-09 NOTE — Telephone Encounter (Signed)
Getting this message when trying to refill potasium chloride.  Please advise.

## 2016-01-09 NOTE — Telephone Encounter (Signed)
Signed Lamar Blinks, MD   Received her labs and called on 10/1      Results for orders placed or performed in visit on [id number]  Basic metabolic panel  Result Value Ref Range    Sodium 140 135 - 145 mEq/L    Potassium 3.2 (L) 3.5 - 5.1 mEq/L    Chloride 100 96 - 112 mEq/L    CO2 33 (H) 19 - 32 mEq/L    Glucose, Bld 83 70 - 99 mg/dL    BUN 15 6 - 23 mg/dL    Creatinine, Ser 0.79 0.40 - 1.20 mg/dL    Calcium 8.7 8.4 - 10.5 mg/dL    GFR 76.24 >60.00 mL/min    Her potassium continues to be low despite using 20 meq of K TID.  Renal function is normal.  Suspect this is due to chronic lasix use.  Called her but did not reach, will try back Called her back on 10/2 and reached her;  She is taking lasix at 40 BID Will exchange her 2nd daily dose of lasix for 25 of spironolactone and have her stop using K; she may take 20 of kcl only during the first 2 days of spiro use.  Will need a BMP within 5 days of change. She understands and agrees with the plan She will come in for a K check within 4-5 days of making this change    Reviewed with the pharmacy and they verbalized understanding, I advised I would discuss with the patient, I discussed with the patient and she said she just to have the potassium on hand, I made her aware that the pharmacy would not fill and only if the medications are adjusted will they fill. I advised of the risks and she voiced understanding.    KP

## 2016-01-10 ENCOUNTER — Telehealth: Payer: Self-pay | Admitting: Family Medicine

## 2016-01-10 MED ORDER — POTASSIUM CHLORIDE CRYS ER 20 MEQ PO TBCR: 40.0000 meq | EXTENDED_RELEASE_TABLET | Freq: Every day | ORAL | 2 refills | Status: DC

## 2016-01-10 NOTE — Telephone Encounter (Signed)
Patient is calling because she believes that since her medications were changed her leg has started to swell a little more than normal. She thinks it could be a medication reaction. She would like to speak with someone to know what she needs to do in regards to what medications to take. Patient states she saw Dr. Lorelei Pont. Pleas advise.   Patient phone: [phone]

## 2016-01-10 NOTE — Telephone Encounter (Signed)
Called her back- she had misunderstood and was taking only spir, not one dose of lasix and one dose of spiro as I had intended.  We cleared up this confusion- she will take a dose of lasix now which we hope will resolve her swelling. She is NOT taking potassium now. She is coming in for a K level on monday

## 2016-01-13 ENCOUNTER — Encounter: Payer: Self-pay | Admitting: Physician Assistant

## 2016-01-13 ENCOUNTER — Ambulatory Visit (INDEPENDENT_AMBULATORY_CARE_PROVIDER_SITE_OTHER): Payer: Medicare Other | Admitting: Physician Assistant

## 2016-01-13 ENCOUNTER — Other Ambulatory Visit (INDEPENDENT_AMBULATORY_CARE_PROVIDER_SITE_OTHER): Payer: Medicare Other

## 2016-01-13 VITALS — BP 141/76 | HR 84 | Ht 63.0 in | Wt 136.6 lb

## 2016-01-13 DIAGNOSIS — R600 Localized edema: Secondary | ICD-10-CM

## 2016-01-13 DIAGNOSIS — E876 Hypokalemia: Secondary | ICD-10-CM | POA: Diagnosis not present

## 2016-01-13 DIAGNOSIS — M79662 Pain in left lower leg: Secondary | ICD-10-CM | POA: Diagnosis not present

## 2016-01-13 DIAGNOSIS — Z86718 Personal history of other venous thrombosis and embolism: Secondary | ICD-10-CM | POA: Diagnosis not present

## 2016-01-13 LAB — BASIC METABOLIC PANEL
BUN: 11 mg/dL (ref 6–23)
CALCIUM: 8.9 mg/dL (ref 8.4–10.5)
CO2: 32 meq/L (ref 19–32)
Chloride: 104 mEq/L (ref 96–112)
Creatinine, Ser: 0.71 mg/dL (ref 0.40–1.20)
GFR: 86.23 mL/min (ref >60.00)
GLUCOSE: 93 mg/dL (ref 70–99)
Potassium: 3.3 mEq/L — ABNORMAL LOW (ref 3.5–5.1)
SODIUM: 141 meq/L (ref 135–145)

## 2016-01-13 NOTE — Patient Instructions (Signed)
Medication Instructions:  Continue current medications  Labwork: None Ordered  Testing/Procedures: Your physician has requested that you have a lower extremity venous duplex. This test is an ultrasound of the veins in the legs or arms. It looks at venous blood flow that carries blood from the heart to the legs or arms. Allow one hour for a Lower Venous exam. Allow thirty minutes for an Upper Venous exam. There are no restrictions or special instructions.  Follow-Up: Your physician recommends that you schedule a follow-up appointment in: March with Dr Claiborne Billings   Any Other Special Instructions Will Be Listed Below (If Applicable).   If you need a refill on your cardiac medications before your next appointment, please call your pharmacy.

## 2016-01-13 NOTE — Telephone Encounter (Signed)
Does she have a f/u appointment-- Dr Edilia Bo ordered med We may need to recheck K and reassess meds

## 2016-01-13 NOTE — Progress Notes (Signed)
Cardiology Office Note   Date:  01/13/2016   ID:  Alexandria, [name] 12/17/1945, MRN [id number]  PCP:  [name], DO  Cardiologist:  Dr Claiborne Billings 06/2015  Rosaria Ferries, PA-C   Chief Complaint  Patient presents with  . Follow-up    sob; when climbing up the stairs. lightheaded; randomly. cant focus. pain and edema in left leg.    History of Present Illness: Alexandria [name] is a 70 y.o. 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 She does not remember much about that period of time. After the DVTs were treated, she had a stroke. She remembers that she was eventually taken off blood thinners. She was on aspirin and Plavix after the stroke, but now is on Plavix alone.  She has developed significant left lower extremity pain and edema. She was given diuretics by her primary care M.D. That has helped the edema, but she is still having a great deal of pain in her right leg. The left leg was swollen some as well, but that has improved since being started on the diuretic. Although the swelling has improved somewhat in her left leg, the pain has not. The calf is not particularly tender. She has noticed engorgement of the veins in that leg and the skin is frequently discolored with a purplish tone.  There was confusion about her diuretic dosages.  Last Wednesday, she was placed on spironolactone, and taken off potassium and Lasix. I reassured her that was      appropriate because we did not generally start spironolactone with the patient still on potassium supplementation      and it was probably felt the Lasix might not be needed. Last Thursday and Friday, she  started back taking Lasix once a day Sunday which was yesterday, she took Lasix twice a day, but not spironolactone. She is due to get labs tomorrow by her primary care physician.  After taking 2 doses of Lasix yesterday, her lower extremity edema is improved.  Her heart skips occasional beats. She did not remember ever being diagnosed with atrial fibrillation and states that it must have happened before her stroke which has caused some memory problems. The palpitations that she gets are very brief and she is comfortable that they are not prolonged or due to a harmful arrhythmia.   Past Medical History:  Diagnosis Date  . Arthritis   . Asthmatic bronchitis with acute exacerbation 02/06/2015  . Colon polyp   . Diverticulitis   . DVT (deep venous thrombosis) ([place])    lower extremity  . H/O cardiac catheterization 2004   Normal coronary arteries  . Heart murmur   . History of stress test 05/21/2011  . Hx of echocardiogram 01/27/2010   Normal Ef 55% the transmitral spectral doppler flow pattern is normal for age. the left ventricular wall motion is normal  . Migraines   . Thyroid disease   . TIA (transient ischemic attack)    8 years ago    Past Surgical History:  Procedure Laterality Date  . BREAST BIOPSY     Alexandria [name]  . CARDIAC CATHETERIZATION  10/2013  . LEFT HEART CATHETERIZATION WITH CORONARY ANGIOGRAM N/A 10/25/2013   Procedure: LEFT HEART CATHETERIZATION WITH CORONARY ANGIOGRAM;  Surgeon: Troy Sine, MD;  Location: [facility] CATH LAB;  Service: Cardiovascular;  Laterality: N/A;  . NECK SURGERY     x's 2  . TOTAL KNEE ARTHROPLASTY     Bilateral x's 2  . VAGINAL HYSTERECTOMY  10/17/1998   Dory Horn    Current Outpatient Prescriptions  Medication Sig Dispense Refill  . Ascorbic Acid (VITAMIN C PO) Take 1 Dose by mouth 4 (four) times daily.    . Biotin (RA BIOTIN) 1000 MCG tablet Take 1,000 mcg by mouth 2 (two) times daily.     Marland Kitchen BREWERS YEAST PO Take 2 tablets by mouth 2  (two) times daily.     . clonazePAM (KLONOPIN) 0.25 MG disintegrating tablet Take 1 tablet (0.25 mg total) by mouth at bedtime. 7 tablet 0  . clopidogrel (PLAVIX) 75 MG tablet TAKE 1 TABLET (75 MG TOTAL) BY MOUTH ONCE. 90 tablet 1  . dicyclomine (BENTYL) 20 MG tablet TAKE 1 TABLET BY MOUTH 4 TIMES A DAY AS NEEDED 360 tablet 1  . estradiol (ESTRACE) 2 MG tablet Take 2 mg by mouth daily.  11  . furosemide (LASIX) 40 MG tablet TAKE 1 TABLET (40 MG TOTAL) BY MOUTH 2 (TWO) TIMES DAILY. 180 tablet 1  . lactulose, encephalopathy, (CHRONULAC) 10 GM/15ML SOLN TAKE 30 MLS (20 G TOTAL) BY MOUTH EVERY 6 (SIX) HOURS AS NEEDED. 1892 mL 1  . Multiple Vitamin (MULTIVITAMIN) tablet Take 1 tablet by mouth daily.    Marland Kitchen spironolactone (ALDACTONE) 25 MG tablet Take 1 tablet (25 mg total) by mouth daily. 30 tablet 2  . SYNTHROID 112 MCG tablet TAKE 1 TABLET (112 MCG TOTAL) BY MOUTH DAILY BEFORE BREAKFAST. 90 tablet 1  . vitamin D, CHOLECALCIFEROL, 400 UNITS tablet Take 400 Units by mouth 2 (two) times daily.      No current facility-administered medications for this visit.     Allergies:   Prednisone; Atrovent hfa [ipratropium bromide hfa]; Cheratussin ac [guaifenesin-codeine]; Hydrocodone; Influenza vaccines; and Motrin [ibuprofen]    Social History:  The patient  reports that she quit smoking about 46 years ago. She quit after 2.00 years of use. She has never used smokeless tobacco. She reports that she does not drink alcohol or use drugs.   Family History:  The patient's family history includes Colon cancer in her brother and father; Diabetes in her maternal grandmother; HIV in her brother; Heart disease in her maternal grandmother; Hypertension in her maternal grandmother; Kidney cancer in her brother; Lung cancer in her brother; Other in her brother; Ovarian cancer in her mother; Prostate cancer in her father; Stroke in her maternal grandmother; Uterine cancer in her mother.    ROS:  Please see the history of  present illness. All other systems are reviewed and negative.    PHYSICAL EXAM: VS:  BP (!) 141/76   Pulse 84   Ht 5\' 3"  (1.6 m)   Wt 136 lb 9.6 oz (62 kg)   BMI 24.20 kg/m  , BMI Body mass index is 24.2 kg/m. GEN: Well nourished, well developed, female in no acute distress  HEENT: normal for age  Neck: no JVD, no carotid bruit, no masses Cardiac: RRR; soft murmur, no rubs, or gallops Respiratory:  clear to auscultation bilaterally, normal work of breathing GI: soft, nontender, nondistended, + BS MS: no deformity or atrophy; 1+ left lower extremity edema, trace right  lower extremity edema; distal pulses are 2+ in all 4 extremities   Skin: warm and dry, no rash; some discoloration is noted in both lower legs Neuro:  Strength and sensation are intact Psych: euthymic mood, full affect   EKG:  EKG is not ordered today.  ECHO: 05/2015 - Left ventricle: The cavity size was normal. Systolic function was   normal. The estimated ejection fraction was in the range of 55%   to 60%. Wall motion was normal; there were no regional wall   motion abnormalities. Doppler parameters are consistent with   abnormal left ventricular relaxation (grade 1 diastolic   dysfunction). - Aortic valve: There was mild regurgitation. - Mitral valve: There was mild regurgitation. - Atrial septum: No defect or patent foramen ovale was identified.  Recent Labs: 06/11/2015: Magnesium 2.1 10/15/2015: TSH 1.89 12/04/2015: ALT 19; Hemoglobin 14.2; Platelets 188.0 01/01/2016: BUN 15; Creatinine, Ser 0.79; Potassium 3.2; Sodium 140    Lipid Panel    Component Value Date/Time   CHOL 169 12/13/2015   TRIG 65 12/13/2015   HDL 73 (A) 12/13/2015   CHOLHDL 2.5 06/11/2015 1002   VLDL 10 06/11/2015 1002   LDLCALC 83 12/13/2015     Wt Readings from Last 3 Encounters:  01/13/16 136 lb 9.6 oz (62 kg)  01/01/16 137 lb 9.6 oz (62.4 kg)  12/31/15 135 lb 9.6 oz (61.5 kg)     Other studies Reviewed: Additional  studies/ records that were reviewed today include: Office notes, hospital records and testing.  ASSESSMENT AND PLAN:  1.  Left lower extremity pain: She has had DVTs in the past. Her cath is very tender to palpation, she cannot stand for me to touch it. There is left greater than right lower extremity edema as well. This is concerning for another DVT. It is not clear to her why she had DVTs in the first place. She is not having any palpitations, no indication of any atrial fibrillation. This was an incidental finding on a Holter monitor over 10 years ago. We will get lower extremity Dopplers in follow-up on the results. If she has had another DVT, she may need to go back on anticoagulation and it may need to be long-term. She is currently only on Plavix.  2. Lower extremity edema: It is unclear if this is secondary to an acute problem such as a DVT, or if this is more of a chronic problem. Her primary care physician is managing her diuretic therapy and following up on the labs. We will make no changes here.  3. Hypertension: Her blood pressure appeared to be elevated in the office today, but improved significantly on a recheck. We will leave management of this to her primary care physician and make no changes today.   Current medicines are reviewed at length with the patient today.  The patient has concerns regarding medicines. Concerns to BE addressed by her primary care physician.  The following changes have been made:  no change  Labs/ tests ordered today include:  No orders of the defined types were placed in this encounter.    Disposition:   FU with Dr. Claiborne Billings  Signed, Rosaria Ferries, PA-C  01/13/2016 11:39 AM    [facility] Phone: [phone]; Fax: [phone]  This note was written with the assistance of speech recognition software. Please excuse any transcriptional errors.

## 2016-01-14 NOTE — Telephone Encounter (Signed)
This has been done already.   KP

## 2016-01-15 ENCOUNTER — Ambulatory Visit (HOSPITAL_COMMUNITY)
Admission: RE | Admit: 2016-01-15 | Discharge: 2016-01-15 | Disposition: A | Discharge status: Home / Self Care | Payer: Medicare Other | Source: Ambulatory Visit | Attending: Cardiovascular Disease | Admitting: Cardiovascular Disease

## 2016-01-15 ENCOUNTER — Telehealth: Payer: Self-pay | Admitting: Family Medicine

## 2016-01-15 DIAGNOSIS — Z8673 Personal history of transient ischemic attack (TIA), and cerebral infarction without residual deficits: Secondary | ICD-10-CM | POA: Diagnosis not present

## 2016-01-15 DIAGNOSIS — Z87891 Personal history of nicotine dependence: Secondary | ICD-10-CM | POA: Diagnosis not present

## 2016-01-15 DIAGNOSIS — M79662 Pain in left lower leg: Secondary | ICD-10-CM

## 2016-01-15 DIAGNOSIS — Z7902 Long term (current) use of antithrombotics/antiplatelets: Secondary | ICD-10-CM | POA: Insufficient documentation

## 2016-01-15 NOTE — Telephone Encounter (Signed)
Patient is stating that the pharmacy cannot get the 20 MG tablet of this medication dicyclomine (BENTYL) 20 MG tablet they are suggesting she get the 10MG  tablet instead. Please advise  Patient phone: [phone] Pharmacy: [id number] - [place], [place] - [address] AT [address]

## 2016-01-16 ENCOUNTER — Telehealth: Payer: Self-pay | Admitting: Family Medicine

## 2016-01-16 NOTE — Progress Notes (Signed)
Called her to discuss recent BMP- her potassium is looking better!  Will plan to check again in 2-3 weeks

## 2016-01-16 NOTE — Telephone Encounter (Signed)
Relation to [id number] Call back [place]: [id number] [place],  - [address] AT [place]  Reason for call:  Patient states dicyclomine (BENTYL) 20 MG tablet has been on back order for a month, pharamacy advised; new script for 10 MG requesting from PCP.  Please advise

## 2016-01-16 NOTE — Addendum Note (Signed)
Addended by: Lamar Blinks C on: 01/16/2016 06:56 PM   Modules accepted: Orders

## 2016-01-17 MED ORDER — DICYCLOMINE HCL 10 MG PO CAPS: 10.0000 mg | ORAL_CAPSULE | Freq: Three times a day (TID) | ORAL | 1 refills | Status: DC

## 2016-01-17 NOTE — Telephone Encounter (Signed)
Rx faxed.    KP 

## 2016-01-17 NOTE — Telephone Encounter (Signed)
Ok to change and change directions to 1-2 tab

## 2016-01-17 NOTE — Telephone Encounter (Signed)
Duplicate request.    KP 

## 2016-01-17 NOTE — Telephone Encounter (Signed)
Please advise if this change is appropriate. If so, please advise on dose ad directions.    KP

## 2016-01-25 IMAGING — DX DG WRIST COMPLETE 3+V*R*
4 series · 4 of 4 positions shown · non-contrast
Comparison: None

CLINICAL DATA: Struck RIGHT arm against wall 1 week ago, fell on
outstretched hand, pain and swelling, RIGHT elbow pain and wrist
pain, initial encounter

EXAM:
RIGHT WRIST - COMPLETE 3+ VIEW

[wrist pa]
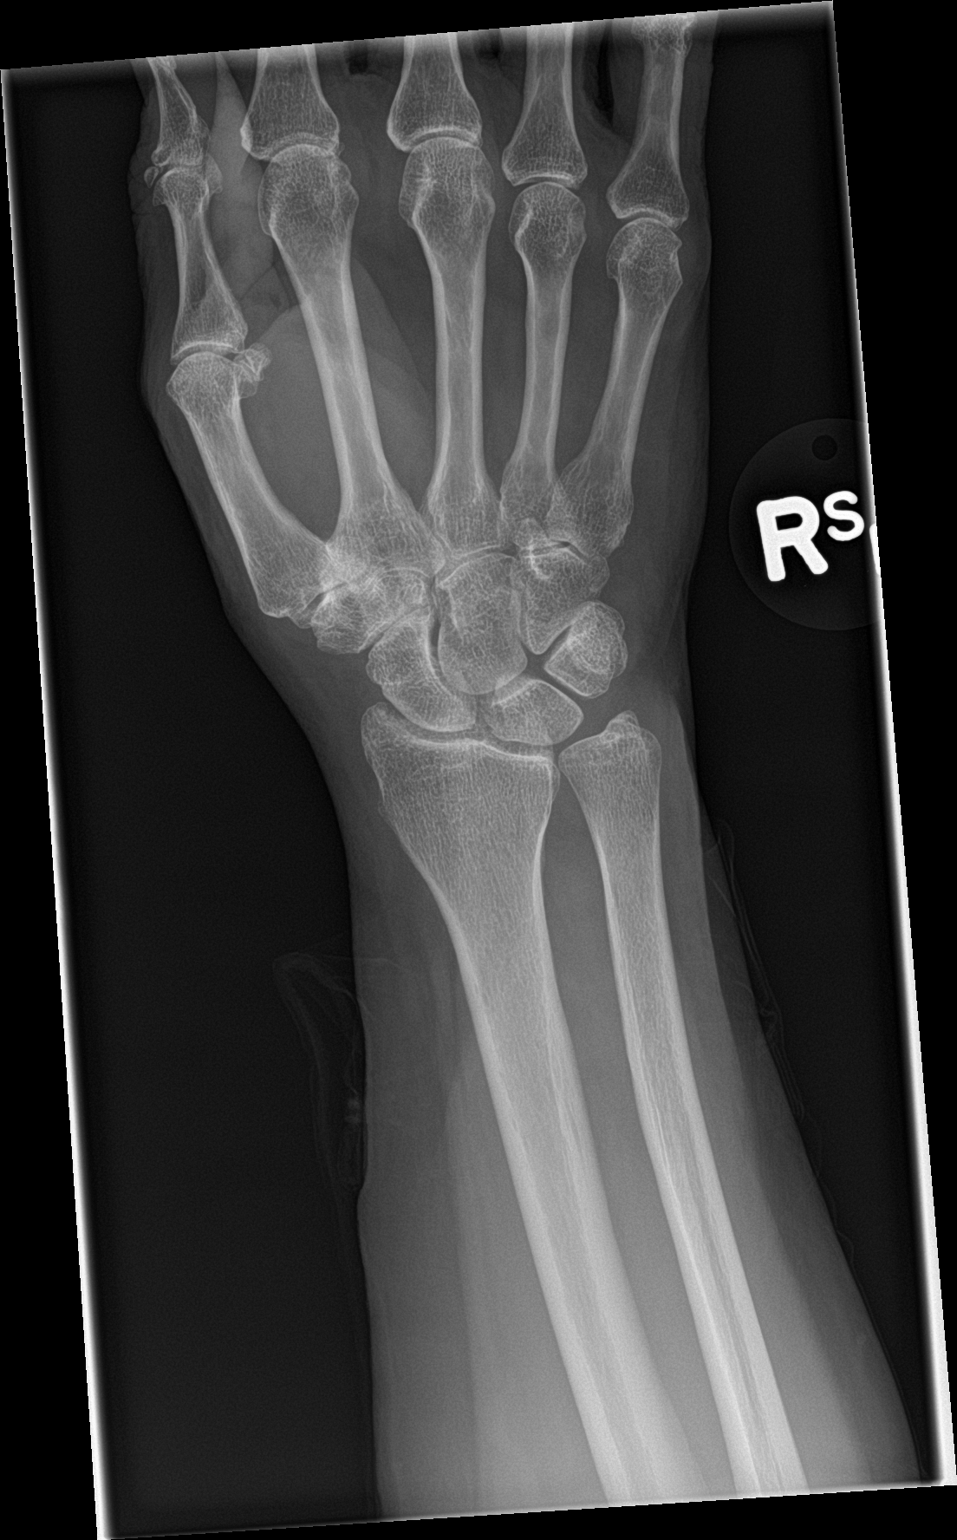

[wrist obl]
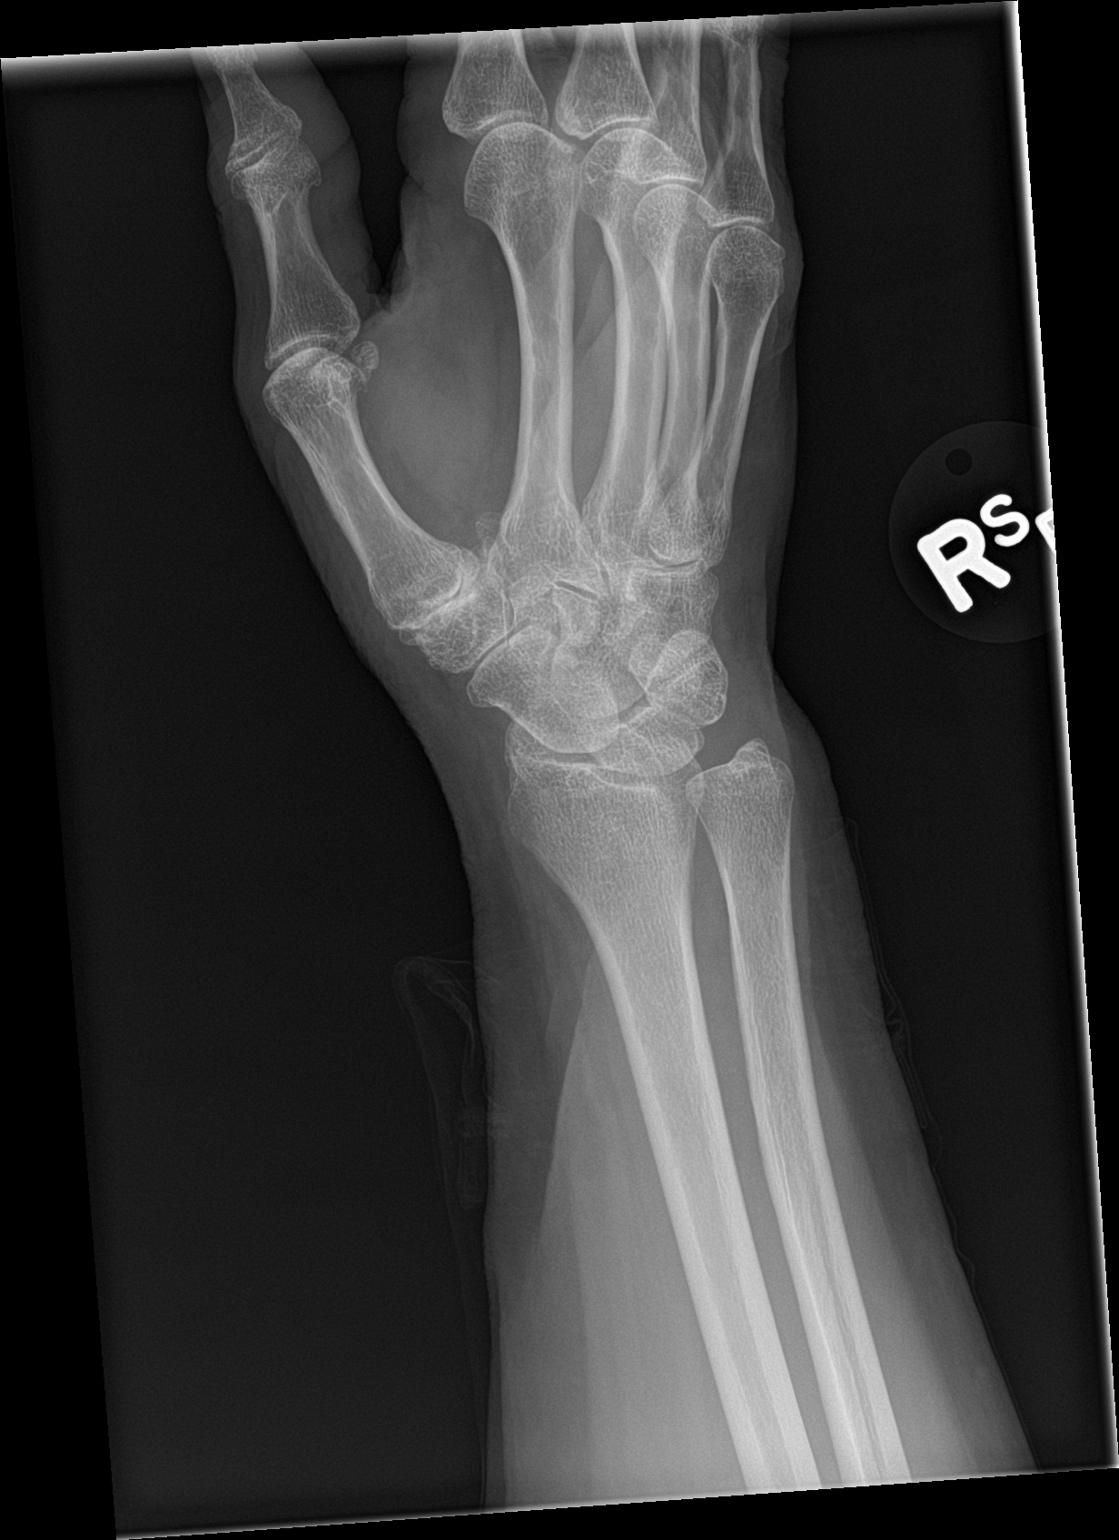

[wrist lat]
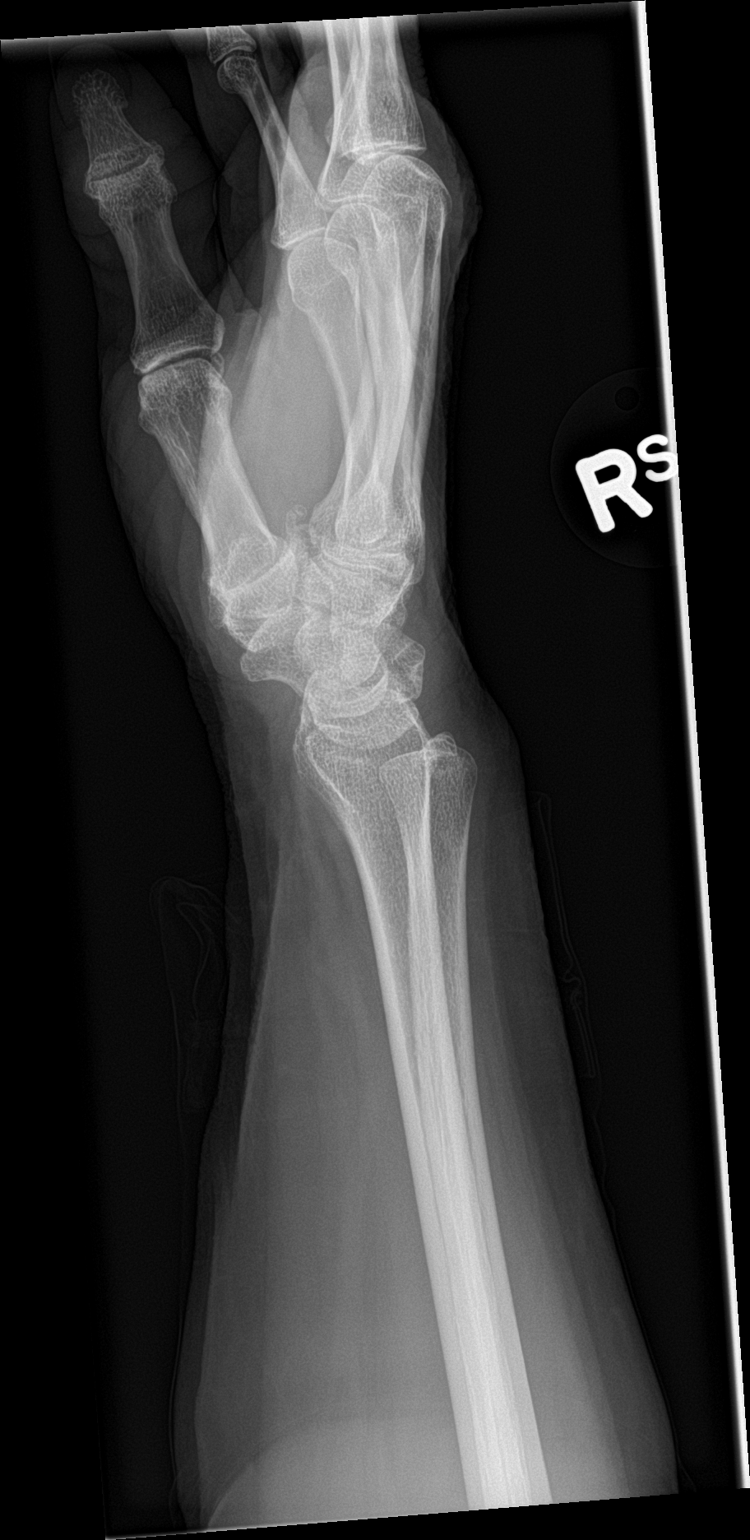

[wrist navicular]
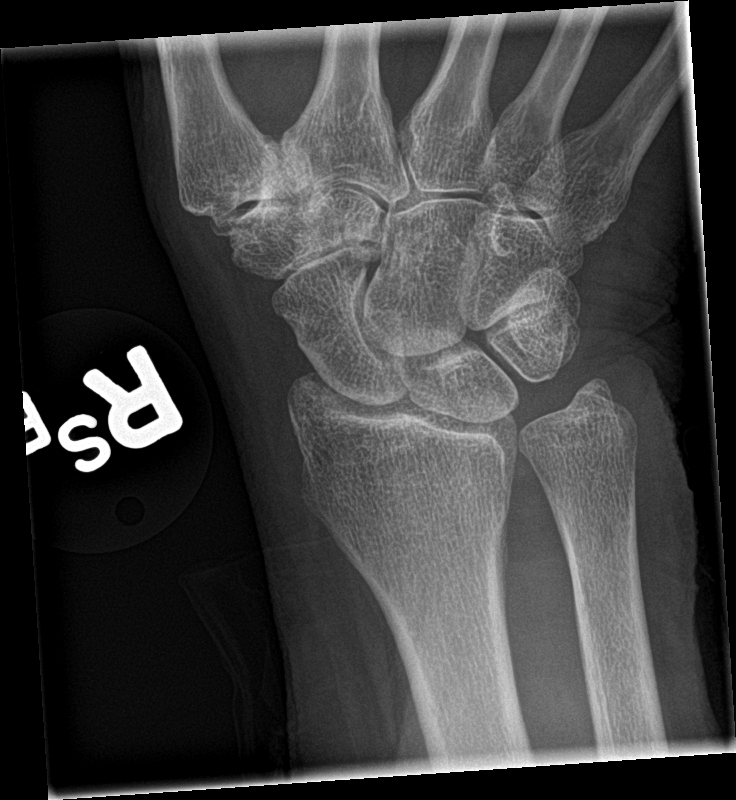

[4 of 4 positions shown; findings below may reference images not displayed]

FINDINGS: Osseous demineralization.

Joint space narrowing at distal pole scaphoid and at first CMC
joint.

No acute fracture, dislocation, or bone destruction.
IMPRESSION: No acute osseous abnormalities.

Osseous demineralization with mild degenerative changes at radial
border of carpus.

## 2016-01-29 ENCOUNTER — Telehealth: Payer: Self-pay | Admitting: *Deleted

## 2016-01-29 NOTE — Telephone Encounter (Signed)
Left results and recommendations on patient's VM ok per dpr. Call back if questions.

## 2016-01-29 NOTE — Telephone Encounter (Signed)
-----   Message from Lonn Georgia, PA-C sent at 01/20/2016  8:31 AM EDT ----- Please let her know they saw swelling in her leg on the test, but no clots.  Keep it elevated, any level of compression sock or perhaps an ACE bandage may help. F/u as planned. Thanks

## 2016-02-04 ENCOUNTER — Encounter: Payer: Self-pay | Admitting: Gastroenterology

## 2016-02-04 ENCOUNTER — Ambulatory Visit (INDEPENDENT_AMBULATORY_CARE_PROVIDER_SITE_OTHER): Payer: Medicare Other | Admitting: Gastroenterology

## 2016-02-04 VITALS — BP 132/70 | HR 68 | Ht 63.0 in | Wt 137.2 lb

## 2016-02-04 DIAGNOSIS — K580 Irritable bowel syndrome with diarrhea: Secondary | ICD-10-CM

## 2016-02-04 DIAGNOSIS — K5730 Diverticulosis of large intestine without perforation or abscess without bleeding: Secondary | ICD-10-CM | POA: Diagnosis not present

## 2016-02-04 DIAGNOSIS — Z8719 Personal history of other diseases of the digestive system: Secondary | ICD-10-CM

## 2016-02-04 MED ORDER — RIFAXIMIN 550 MG PO TABS: 550.0000 mg | ORAL_TABLET | Freq: Three times a day (TID) | ORAL | 0 refills | Status: DC

## 2016-02-04 NOTE — Progress Notes (Signed)
Alexandria [name]    [id number]    [birth year]  Primary Care Physician:Yvonne R Carollee Herter, DO  Referring Physician: [name], DO [address] [place], [place] [address]  Chief complaint:  Recurrent diverticulitis HPI: 70 year-old female with history of severe L side diverticulosis, last seen in May 2017 here for follow-up visit. Patient complains of having frequent episodes of left lower quadrant abdominal pain associated with cramps and diarrhea. Last episode of diverticulitis in September 2016 that was treated with Cipro, she has been on and off antibiotics for ? Diverticulitis. Her last colonoscopy in 2014 at [facility] showed severe L sided diverticulosis with fixed segment of sigmoid colon. She has alternating constipation and diarrhea associated with severe bloating and cramping. No blood in stool. Reviewed labs from march 2017 with normal ESR and BMP. CT abdomen and pelvis with contrast in September 2016 showed mild acute diverticulitis in the sigmoid colon. Denies any nausea or vomiting. Weight is stable    Outpatient Encounter Prescriptions as of 02/04/2016  Medication Sig  . Ascorbic Acid (VITAMIN C PO) Take 1 Dose by mouth 4 (four) times daily.  . Biotin (RA BIOTIN) 1000 MCG tablet Take 1,000 mcg by mouth 2 (two) times daily.   Marland Kitchen BREWERS YEAST PO Take 2 tablets by mouth 2 (two) times daily.   . clonazePAM (KLONOPIN) 0.25 MG disintegrating tablet Take 1 tablet (0.25 mg total) by mouth at bedtime.  . clopidogrel (PLAVIX) 75 MG tablet TAKE 1 TABLET (75 MG TOTAL) BY MOUTH ONCE.  Marland Kitchen dicyclomine (BENTYL) 10 MG capsule Take 1-2 capsules (10-20 mg total) by mouth 4 (four) times daily -  before meals and at bedtime.  Marland Kitchen estradiol (ESTRACE) 2 MG tablet Take 2 mg by mouth daily.  . furosemide (LASIX) 40 MG tablet TAKE 1 TABLET (40 MG TOTAL) BY MOUTH 2 (TWO) TIMES DAILY.  Marland Kitchen lactulose, encephalopathy, (CHRONULAC) 10 GM/15ML SOLN TAKE 30 MLS (20 G TOTAL)  BY MOUTH EVERY 6 (SIX) HOURS AS NEEDED.  . Multiple Vitamin (MULTIVITAMIN) tablet Take 1 tablet by mouth daily.  Marland Kitchen spironolactone (ALDACTONE) 25 MG tablet Take 1 tablet (25 mg total) by mouth daily.  Marland Kitchen SYNTHROID 112 MCG tablet TAKE 1 TABLET (112 MCG TOTAL) BY MOUTH DAILY BEFORE BREAKFAST.  Marland Kitchen vitamin D, CHOLECALCIFEROL, 400 UNITS tablet Take 400 Units by mouth 2 (two) times daily.    No facility-administered encounter medications on file as of 02/04/2016.     Allergies as of 02/04/2016 - Review Complete 02/04/2016  Allergen Reaction Noted  . Prednisone Other (See Comments) 12/18/2011  . Atrovent hfa [ipratropium bromide hfa] Other (See Comments) 02/14/2015  . Cheratussin ac [guaifenesin-codeine]  01/29/2015  . Hydrocodone Itching 09/28/2012  . Influenza vaccines Other (See Comments) 01/30/2015  . Motrin [ibuprofen] Other (See Comments) 10/23/2013    Past Medical History:  Diagnosis Date  . Arthritis   . Asthmatic bronchitis with acute exacerbation 02/06/2015  . Colon polyp   . Diverticulitis   . DVT (deep venous thrombosis) ([place])    lower extremity  . H/O cardiac catheterization 2004   Normal coronary arteries  . Heart murmur   . History of stress test 05/21/2011  . Hx of echocardiogram 01/27/2010   Normal Ef 55% the transmitral spectral doppler flow pattern is normal for age. the left ventricular wall motion is normal  . Migraines   . Thyroid disease   . TIA (transient ischemic attack)  8 years ago    Past Surgical History:  Procedure Laterality Date  . BREAST BIOPSY     Isaiah Blakes  . CARDIAC CATHETERIZATION     10/2013  . LEFT HEART CATHETERIZATION WITH CORONARY ANGIOGRAM N/A 10/25/2013   Procedure: LEFT HEART CATHETERIZATION WITH CORONARY ANGIOGRAM;  Surgeon: Troy Sine, MD;  Location: [facility] CATH LAB;  Service: Cardiovascular;  Laterality: N/A;  . NECK SURGERY     x's 2  . TOTAL KNEE ARTHROPLASTY     Bilateral x's 2  . VAGINAL HYSTERECTOMY  10/17/1998   Dory Horn     Family History  Problem Relation Age of Onset  . Arthritis    . Colon cancer Father   . Prostate cancer Father   . HIV Brother     60  . Kidney cancer Brother   . Colon cancer Brother   . Lung cancer Brother   . Other Brother     Mouth Cancer  . Ovarian cancer Mother   . Uterine cancer Mother   . Heart disease Maternal Grandmother   . Stroke Maternal Grandmother   . Hypertension Maternal Grandmother   . Diabetes Maternal Grandmother     Social History   Social History  . Marital status: Married    Spouse name: [name]  . Number of children: 3  . Years of education: [employer]   Occupational History  . DISABLED Unemployed   Social History Main Topics  . Smoking status: Former Smoker    Years: 2.00    Quit date: 04/06/1969  . Smokeless tobacco: Never Used     Comment: socially smoked x 2 years  . Alcohol use No  . Drug use: No  . Sexual activity: Not on file   Other Topics Concern  . Not on file   Social History Narrative   Lives with husband [name]   Caffeine use: coffee (2 cups per day)      Review of systems: Review of Systems  Constitutional: Negative for fever and chills.  HENT: Positive for sinus problems  Eyes: Negative for blurred vision.  Respiratory: Negative for cough, shortness of breath and wheezing.   Cardiovascular: Negative for chest pain and palpitations.  Gastrointestinal: as per HPI Genitourinary: Negative for dysuria, urgency, frequency and hematuria.  Musculoskeletal: Negative for myalgias, back pain and joint pain.  Skin: Negative for itching and rash.  Neurological: Negative for dizziness, tremors, focal weakness, seizures and loss of consciousness.  Endo/Heme/Allergies: Positive for seasonal allergies.  Psychiatric/Behavioral: Negative for depression, suicidal ideas and hallucinations.  All other systems reviewed and are negative.   Physical Exam: Vitals:   02/04/16 0949  BP: 132/70  Pulse: 68   Body mass index is 24.3  kg/m. Gen:      No acute distress HEENT:  EOMI, sclera anicteric Neck:     No masses; no thyromegaly Lungs:    Clear to auscultation bilaterally; normal respiratory effort CV:         Regular rate and rhythm; no murmurs Abd:      + bowel sounds; soft, non-tender; no palpable masses, no distension Ext:    No edema; adequate peripheral perfusion Skin:      Warm and dry; no rash Neuro: alert and oriented x 3 Psych: normal mood and affect  Data Reviewed:  Reviewed labs, radiology imaging, old records and pertinent past GI work up   Assessment and Plan/Recommendations:  70 year old female with history of severe left-sided diverticulosis and recurrent diverticulitis here with complaints  of persistent intermittent left lower abdominal pain associated with diarrhea 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 She had multiple questions which were answered to her satisfaction  K. Denzil Magnuson , MD [phone] Mon-Fri 8a-5p [phone] after 5p, weekends, holidays  CC: Carollee Herter, Alferd Apa, *

## 2016-02-04 NOTE — Patient Instructions (Signed)
We have sent Xifaxian to your pharmacy   Follow up in 3 months

## 2016-02-17 ENCOUNTER — Other Ambulatory Visit: Payer: Medicare Other

## 2016-02-26 ENCOUNTER — Ambulatory Visit (INDEPENDENT_AMBULATORY_CARE_PROVIDER_SITE_OTHER): Payer: Medicare Other | Admitting: Family Medicine

## 2016-02-26 ENCOUNTER — Ambulatory Visit (HOSPITAL_BASED_OUTPATIENT_CLINIC_OR_DEPARTMENT_OTHER)
Admission: RE | Admit: 2016-02-26 | Discharge: 2016-02-26 | Disposition: A | Discharge status: Home / Self Care | Payer: Medicare Other | Source: Ambulatory Visit | Attending: Family Medicine | Admitting: Family Medicine

## 2016-02-26 ENCOUNTER — Encounter: Payer: Self-pay | Admitting: Family Medicine

## 2016-02-26 VITALS — BP 147/85 | HR 68 | Temp 98.0°F | Ht 63.0 in | Wt 136.4 lb

## 2016-02-26 DIAGNOSIS — E039 Hypothyroidism, unspecified: Secondary | ICD-10-CM | POA: Diagnosis not present

## 2016-02-26 DIAGNOSIS — Z96652 Presence of left artificial knee joint: Secondary | ICD-10-CM | POA: Insufficient documentation

## 2016-02-26 DIAGNOSIS — R600 Localized edema: Secondary | ICD-10-CM

## 2016-02-26 DIAGNOSIS — E876 Hypokalemia: Secondary | ICD-10-CM | POA: Diagnosis not present

## 2016-02-26 DIAGNOSIS — R059 Cough, unspecified: Secondary | ICD-10-CM

## 2016-02-26 DIAGNOSIS — F418 Other specified anxiety disorders: Secondary | ICD-10-CM

## 2016-02-26 DIAGNOSIS — Z5181 Encounter for therapeutic drug level monitoring: Secondary | ICD-10-CM | POA: Diagnosis not present

## 2016-02-26 DIAGNOSIS — R05 Cough: Secondary | ICD-10-CM | POA: Diagnosis not present

## 2016-02-26 DIAGNOSIS — M7989 Other specified soft tissue disorders: Secondary | ICD-10-CM | POA: Diagnosis not present

## 2016-02-26 DIAGNOSIS — F17200 Nicotine dependence, unspecified, uncomplicated: Secondary | ICD-10-CM | POA: Diagnosis not present

## 2016-02-26 LAB — COMPREHENSIVE METABOLIC PANEL
ALBUMIN: 4.3 g/dL (ref 3.5–5.2)
ALK PHOS: 68 U/L (ref 39–117)
ALT: 24 U/L (ref 0–35)
AST: 25 U/L (ref 0–37)
BILIRUBIN TOTAL: 0.6 mg/dL (ref 0.2–1.2)
BUN: 16 mg/dL (ref 6–23)
CALCIUM: 9.3 mg/dL (ref 8.4–10.5)
CO2: 32 mEq/L (ref 19–32)
CREATININE: 1.03 mg/dL (ref 0.40–1.20)
Chloride: 97 mEq/L (ref 96–112)
GFR: 56.11 mL/min — ABNORMAL LOW (ref >60.00)
Glucose, Bld: 89 mg/dL (ref 70–99)
Potassium: 3 mEq/L — ABNORMAL LOW (ref 3.5–5.1)
SODIUM: 139 meq/L (ref 135–145)
TOTAL PROTEIN: 7.6 g/dL (ref 6.0–8.3)

## 2016-02-26 LAB — TSH: TSH: 1.99 u[IU]/mL (ref 0.35–4.50)

## 2016-02-26 MED ORDER — BUSPIRONE HCL 15 MG PO TABS: 15.0000 mg | ORAL_TABLET | Freq: Two times a day (BID) | ORAL | 3 refills | Status: DC

## 2016-02-26 NOTE — Progress Notes (Signed)
Pre visit review using our clinic review tool, if applicable. No additional management support is needed unless otherwise documented below in the visit note. 

## 2016-02-26 NOTE — Progress Notes (Addendum)
[place] at [facility] [address], [place], [place] [address] [phone]  Date:  02/26/2016   Name:  Alexandria [name]   DOB:  May 07, 1945   MRN:  [id number]  PCP:  [name], DO    Chief Complaint: Potassium Concerns (Pt started Spironolactone and states that she still feels "foggy" and is also having a lot of anxiety. Would like an xray of left knee due to continued swelling. Will need labs to check potassium and thyroid. )   History of Present Illness:  Alexandria [name] is a 70 y.o. very pleasant female patient who presents with the following:  Here today to recheck her potassium and BP I last saw her about 2 months ago- at that time we added spiro to her diuretic regimen due to recalcitrant hypokalemia-   Here today fora follow-up visit.  I saw her on 8/30 with numbness of left face and hypokalemia. We did an MRI that looked fine and referred her to see neurology. She just saw neurology yesterday- they did check an ESR and CRP to rule out TA, but otherwise eval was benign. ESR and CRP quite normal.  She does need to recheck her K today.    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  She does have some pain still in her left face.   They had discussed steroids, but she does not want to use these at least for now. She is just dealing with the pain     BP Readings from Last 3 Encounters:  01/01/16 (!) 147/84  12/31/15 (!) 151/83  12/04/15 132/84   She did do "lifeline screening" recently.  They did an EKG and various labs that we will abstract as needed  She is taking her K TID right now- this does seem to be helping her, at least she feels better  She is on lasix for swelling but notes that even when she stopped lasix temporarily her hypokalemia continued.  Discussed addition of a K sparing diuretic and she might be interested in trying this   She is now taking sprio 25 daily and lasix 40 either  once or twice a day depending on swelling in her legs She would like to check her K and also her TSH today  She has had her knees replaced twice- most recent operation was about 7 years ago in [place]- she would like to have left knee films "just to make sure everything is still in place," she does tend to have some knee pain  She has had a few DVTs in the past- however a recent US (last month) was negative for any clot. Prior DVT were both peri-operative and other times.   She is on plavix but not coumadin, etc  She will sometimes feel "foggy" when she wakes up in the morning.  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  She does not feel that she is depressed however. She would like to start on some medication for anxiety and would like a non- sedating medication such as buspar   Patient Active Problem List   Diagnosis Date Noted  . Insomnia 11/18/2015  .  Epistaxis 11/18/2015  . Normal coronary arteries 05/20/2015  . Chest pain at rest 05/20/2015  . RBBB 05/20/2015  . Abnormal CXR 05/20/2015  . History of MI (myocardial infarction) 05/08/2015  . Bruising 05/08/2015  . Upper airway cough syndrome 02/14/2015  . Asthmatic bronchitis with acute exacerbation 02/06/2015  . Decreased urination 12/28/2014  . Right wrist injury 11/18/2014  . Hypothyroidism 10/25/2014  . Fatigue 10/25/2014  . 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 Chronic venous insufficiency 10/24/2014  . Varicose veins of leg with complications 26/41/5830  . Degeneration of intervertebral disc of cervical region 06/29/2014  . Palpitations 11/08/2013  . SOB (shortness of breath) 10/03/2013  . DOE (dyspnea on exertion) 10/03/2013  . Edema of both legs 10/03/2013  . Dyspnea 09/25/2013  .  Sinusitis, acute 06/17/2013  . Hair loss 11/25/2012  . Falling hair 11/25/2012  . Diverticulitis 07/14/2012  . Hammer toe 06/13/2012  . Recurrent abdominal pain 04/17/2012  . Abdominal pain 01/06/2012  . Hypoglycemia 01/06/2012    Past Medical History:  Diagnosis Date  . Arthritis   . Asthmatic bronchitis with acute exacerbation 02/06/2015  . Colon polyp   . Diverticulitis   . DVT (deep venous thrombosis) ([place])    lower extremity  . H/O cardiac catheterization 2004   Normal coronary arteries  . Heart murmur   . History of stress test 05/21/2011  . Hx of echocardiogram 01/27/2010   Normal Ef 55% the transmitral spectral doppler flow pattern is normal for age. the left ventricular wall motion is normal  . Migraines   . Thyroid disease   . TIA (transient ischemic attack)    8 years ago    Past Surgical History:  Procedure Laterality Date  . BREAST BIOPSY     Isaiah Blakes  . CARDIAC CATHETERIZATION     10/2013  . LEFT HEART CATHETERIZATION WITH CORONARY ANGIOGRAM N/A 10/25/2013   Procedure: LEFT HEART CATHETERIZATION WITH CORONARY ANGIOGRAM;  Surgeon: Troy Sine, MD;  Location: [facility] CATH LAB;  Service: Cardiovascular;  Laterality: N/A;  . NECK SURGERY     x's 2  . TOTAL KNEE ARTHROPLASTY     Bilateral x's 2  . VAGINAL HYSTERECTOMY  10/17/1998   Dory Horn    Social History  Substance Use Topics  . Smoking status: Former Smoker    Years: 2.00    Quit date: 04/06/1969  . Smokeless tobacco: Never Used     Comment: socially smoked x 2 years  . Alcohol use No    Family History  Problem Relation Age of Onset  . Arthritis    . Colon cancer Father   . Prostate cancer Father   . HIV Brother     33  . Kidney cancer Brother   . Colon cancer Brother   . Lung cancer Brother   . Other Brother     Mouth Cancer  . Ovarian cancer Mother   . Uterine cancer Mother   . Heart disease Maternal Grandmother   . Stroke Maternal Grandmother   . Hypertension Maternal Grandmother   .  Diabetes Maternal Grandmother     Allergies  Allergen Reactions  . Prednisone Other (See Comments)    Homicidal  . Atrovent Hfa [Ipratropium Bromide Hfa] Other (See Comments)    Pt could not sleep  . Cheratussin Ac [Guaifenesin-Codeine]   . Hydrocodone Itching  . Influenza Vaccines Other (See Comments)    Pt reports heart attack after last flu shot  . Motrin [Ibuprofen] Other (See Comments)    "  Gives false reading in blood"    Medication list has been reviewed and updated.  Current Outpatient Prescriptions on File Prior to Visit  Medication Sig Dispense Refill  . Ascorbic Acid (VITAMIN C PO) Take 1 Dose by mouth 4 (four) times daily.    . Biotin (RA BIOTIN) 1000 MCG tablet Take 1,000 mcg by mouth 2 (two) times daily.     Marland Kitchen BREWERS YEAST PO Take 2 tablets by mouth 2 (two) times daily.     . clonazePAM (KLONOPIN) 0.25 MG disintegrating tablet Take 1 tablet (0.25 mg total) by mouth at bedtime. 7 tablet 0  . clopidogrel (PLAVIX) 75 MG tablet TAKE 1 TABLET (75 MG TOTAL) BY MOUTH ONCE. 90 tablet 1  . dicyclomine (BENTYL) 10 MG capsule Take 1-2 capsules (10-20 mg total) by mouth 4 (four) times daily -  before meals and at bedtime. 360 capsule 1  . estradiol (ESTRACE) 2 MG tablet Take 2 mg by mouth daily.  11  . furosemide (LASIX) 40 MG tablet TAKE 1 TABLET (40 MG TOTAL) BY MOUTH 2 (TWO) TIMES DAILY. 180 tablet 1  . lactulose, encephalopathy, (CHRONULAC) 10 GM/15ML SOLN TAKE 30 MLS (20 G TOTAL) BY MOUTH EVERY 6 (SIX) HOURS AS NEEDED. 1892 mL 1  . Multiple Vitamin (MULTIVITAMIN) tablet Take 1 tablet by mouth daily.    . rifaximin (XIFAXAN) 550 MG TABS tablet Take 1 tablet (550 mg total) by mouth 3 (three) times daily. 42 tablet 0  . spironolactone (ALDACTONE) 25 MG tablet Take 1 tablet (25 mg total) by mouth daily. 30 tablet 2  . SYNTHROID 112 MCG tablet TAKE 1 TABLET (112 MCG TOTAL) BY MOUTH DAILY BEFORE BREAKFAST. 90 tablet 1  . vitamin D, CHOLECALCIFEROL, 400 UNITS tablet Take 400 Units  by mouth 2 (two) times daily.      No current facility-administered medications on file prior to visit.     Review of Systems:  .As per HPI- otherwise negative.   Physical Examination: Vitals:   02/26/16 1117 02/26/16 1126  BP: (!) 148/90 (!) 147/85  Pulse: 68   Temp: 98 F (36.7 C)    Vitals:   02/26/16 1117  Weight: 136 lb 6.4 oz (61.9 kg)  Height: '5\' 3"'$  (1.6 m)   Body mass index is 24.16 kg/m. Ideal Body Weight: Weight in (lb) to have BMI = 25: 140.8  GEN: WDWN, NAD, Non-toxic, A & O x 3, looks well, normal weiht HEENT: Atraumatic, Normocephalic. Neck supple. No masses, No LAD. Ears and Nose: No external deformity. CV: RRR, No M/G/R. No JVD. No thrill. No extra heart sounds. PULM: CTA B, no wheezes, crackles, rhonchi. No retractions. No resp. distress. No accessory muscle use. EXTR: No c/c.  Minimal edema of both LE, left more than right  NEURO Normal gait.  PSYCH: Normally interactive. Conversant. Not depressed or anxious appearing.  Calm demeanor.  Scarring both knees from knee replacement operations  She has some bruising and tenderness of both legs- this is chronic per her report She is wearing compression stockings on both legs   Assessment and Plan: Medication monitoring encounter  Lower extremity edema - Plan: DG Knee Complete 4 Views Left, Comprehensive metabolic panel  Situational anxiety - Plan: busPIRone (BUSPAR) 15 MG tablet  Acquired hypothyroidism - Plan: TSH  Hypokalemia  Cough - Plan: DG Chest 2 View  Will obtain knee and chest films for her today Start on buspar Await her labs and will be in touch with her asap Assuming labs are  ok plan to recheck in 3-4 months Will plan further follow- up pending labs.   Signed Lamar Blinks, MD  Called to discuss labs 11/25.  Her K is still low- we stopped her K 20 TID when we added spiro.  She will go back on 20 mg once a day and will repeat a k in on week.    Results for orders placed or  performed in visit on 02/26/16  Comprehensive metabolic panel  Result Value Ref Range   Sodium 139 135 - 145 mEq/L   Potassium 3.0 (L) 3.5 - 5.1 mEq/L   Chloride 97 96 - 112 mEq/L   CO2 32 19 - 32 mEq/L   Glucose, Bld 89 70 - 99 mg/dL   BUN 16 6 - 23 mg/dL   Creatinine, Ser 1.03 0.40 - 1.20 mg/dL   Total Bilirubin 0.6 0.2 - 1.2 mg/dL   Alkaline Phosphatase 68 39 - 117 U/L   AST 25 0 - 37 U/L   ALT 24 0 - 35 U/L   Total Protein 7.6 6.0 - 8.3 g/dL   Albumin 4.3 3.5 - 5.2 g/dL   Calcium 9.3 8.4 - 10.5 mg/dL   GFR 56.11 (L) >60.00 mL/min  TSH  Result Value Ref Range   TSH 1.99 0.35 - 4.50 uIU/mL

## 2016-02-26 NOTE — Patient Instructions (Signed)
Please go to lab for a blood draw, and then to the ground floor imaging department for x-rays of your left knee and your chest I will be in touch with your labs asap!  Start on the buspar 15 mg; 1/2 tablet twice a day for 5 days, then 1/2 am and whole pm for 5 days, then whole twice a day.  Let me know how this does for you!

## 2016-02-29 ENCOUNTER — Encounter: Payer: Self-pay | Admitting: Family Medicine

## 2016-02-29 NOTE — Addendum Note (Signed)
Addended by: Lamar Blinks C on: 02/29/2016 11:35 AM   Modules accepted: Orders

## 2016-03-02 ENCOUNTER — Other Ambulatory Visit: Payer: Self-pay | Admitting: Cardiovascular Disease

## 2016-03-02 NOTE — Telephone Encounter (Signed)
REFILL 

## 2016-03-03 ENCOUNTER — Telehealth: Payer: Self-pay | Admitting: Gastroenterology

## 2016-03-03 NOTE — Telephone Encounter (Signed)
Left details on her voicemail. DPR on file

## 2016-03-03 NOTE — Telephone Encounter (Signed)
Ok to discontinue Xifaxan, advise patient to stay on liquid diet for today and tomorrow, call if symptoms worse Thanks

## 2016-03-03 NOTE — Telephone Encounter (Signed)
She decreased the Xifaxan to BID due to nausea. She then stopped this on Friday because she felt so bad on Xifaxan. She complains of abdominal tenderness and bloating. She feels constipated. States her symptoms are unchanged from before she began [place]. Think a slight fever on Friday. No blood from rectum, no nausea or vomiting. Please advise.

## 2016-03-10 ENCOUNTER — Other Ambulatory Visit (INDEPENDENT_AMBULATORY_CARE_PROVIDER_SITE_OTHER): Payer: Medicare Other

## 2016-03-10 ENCOUNTER — Telehealth: Payer: Self-pay | Admitting: Family Medicine

## 2016-03-10 DIAGNOSIS — E876 Hypokalemia: Secondary | ICD-10-CM | POA: Diagnosis not present

## 2016-03-10 LAB — BASIC METABOLIC PANEL
BUN: 13 mg/dL (ref 6–23)
CALCIUM: 8.9 mg/dL (ref 8.4–10.5)
CHLORIDE: 101 meq/L (ref 96–112)
CO2: 32 meq/L (ref 19–32)
CREATININE: 0.95 mg/dL (ref 0.40–1.20)
GFR: 61.59 mL/min (ref >60.00)
Glucose, Bld: 98 mg/dL (ref 70–99)
Potassium: 3.4 mEq/L — ABNORMAL LOW (ref 3.5–5.1)
Sodium: 139 mEq/L (ref 135–145)

## 2016-03-10 NOTE — Telephone Encounter (Signed)
Called Alexandria [name] to schedule annual wellness appt. Left msg for pt to call office to schedule appt.

## 2016-03-12 ENCOUNTER — Encounter: Payer: Self-pay | Admitting: Family Medicine

## 2016-03-17 ENCOUNTER — Ambulatory Visit: Payer: Medicare Other | Admitting: *Deleted

## 2016-03-18 ENCOUNTER — Ambulatory Visit (INDEPENDENT_AMBULATORY_CARE_PROVIDER_SITE_OTHER): Payer: Medicare Other | Admitting: Family Medicine

## 2016-03-18 ENCOUNTER — Encounter: Payer: Self-pay | Admitting: Family Medicine

## 2016-03-18 VITALS — BP 130/74 | HR 70 | Temp 98.1°F | Ht 63.0 in | Wt 135.2 lb

## 2016-03-18 DIAGNOSIS — E876 Hypokalemia: Secondary | ICD-10-CM

## 2016-03-18 DIAGNOSIS — I952 Hypotension due to drugs: Secondary | ICD-10-CM | POA: Diagnosis not present

## 2016-03-18 NOTE — Progress Notes (Signed)
[place] at [facility] [address], [place], [place] [address] [id number] [phone]  Date:  03/18/2016   Name:  Alexandria [name]   DOB:  1946-01-01   MRN:  [id number]  PCP:  [name], DO    Chief Complaint: No chief complaint on file.   History of Present Illness:  ALYCIANA [name] is a 70 y.o. very pleasant female patient who presents with the following:  Last seen about one month ago- we started buspar for anxiety and had recently added spiro for persistent  hypokalemia BP Readings from Last 3 Encounters:  03/18/16 130/74  02/26/16 (!) 147/85  02/04/16 132/70   She went to the dentist on Monday- her BP was quite low , 90/ 50, 99/50 on recheck. She was feeling weak and awful- left their office and drank some water, felt better She has a history of dumping syndrome and felt ill that day, she thinks this is why her BP was low. She was eating lightly that day as her stomach was upset  She feels back to normal today She stopped the spiro as her legs were swelling- she would notice am swelling while on this medication She is back on 40 of KCL daily She is on lasix 40, 1 or 2 a day- she is using this for her pedal edema. Not for CHF.    She feels that her fluid balance is currently ok  Wt Readings from Last 3 Encounters:  03/18/16 135 lb 3.2 oz (61.3 kg)  02/26/16 136 lb 6.4 oz (61.9 kg)  02/04/16 137 lb 3.2 oz (62.2 kg)     Lab on 03/10/2016  Component Date Value Ref Range Status  . Sodium 03/10/2016 139  135 - 145 mEq/L Final  . Potassium 03/10/2016 3.4* 3.5 - 5.1 mEq/L Final  . Chloride 03/10/2016 101  96 - 112 mEq/L Final  . CO2 03/10/2016 32  19 - 32 mEq/L Final  . Glucose, Bld 03/10/2016 98  70 - 99 mg/dL Final  . BUN 03/10/2016 13  6 - 23 mg/dL Final  . Creatinine, Ser 03/10/2016 0.95  0.40 - 1.20 mg/dL Final  . Calcium 03/10/2016 8.9  8.4 - 10.5 mg/dL Final  . GFR 03/10/2016 61.59  >60.00 mL/min  Final     Patient Active Problem List   Diagnosis Date Noted  . Insomnia 11/18/2015  . Epistaxis 11/18/2015  . Normal coronary arteries 05/20/2015  . Chest pain at rest 05/20/2015  . RBBB 05/20/2015  . Abnormal CXR 05/20/2015  . History of MI (myocardial infarction) 05/08/2015  . Bruising 05/08/2015  . Upper airway cough syndrome 02/14/2015  . Asthmatic bronchitis with acute exacerbation 02/06/2015  . Decreased urination 12/28/2014  . Hypothyroidism 10/25/2014  . Fatigue 10/25/2014  . 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 Chronic venous insufficiency 10/24/2014  . Varicose veins of leg with complications [id number]  . Degeneration of intervertebral disc of cervical region 06/29/2014  . Palpitations 11/08/2013  . SOB (shortness of breath) 10/03/2013  . DOE (dyspnea on exertion) 10/03/2013  . Edema of both legs 10/03/2013  . Hair loss 11/25/2012  . Falling hair 11/25/2012  . Diverticulitis 07/14/2012  . Hammer toe 06/13/2012  . Recurrent abdominal pain 04/17/2012  . Hypoglycemia 01/06/2012    Past Medical History:  Diagnosis Date  . Arthritis   . Asthmatic bronchitis with acute exacerbation 02/06/2015  . Colon polyp   . Diverticulitis   . DVT (deep venous  thrombosis) ([place])    lower extremity  . H/O cardiac catheterization 2004   Normal coronary arteries  . Heart murmur   . History of stress test 05/21/2011  . Hx of echocardiogram 01/27/2010   Normal Ef 55% the transmitral spectral doppler flow pattern is normal for age. the left ventricular wall motion is normal  . Migraines   . Thyroid disease   . TIA (transient ischemic attack)    8 years ago    Past Surgical History:  Procedure Laterality Date  . BREAST BIOPSY     Isaiah Blakes  . CARDIAC CATHETERIZATION     10/2013  . LEFT HEART CATHETERIZATION WITH CORONARY ANGIOGRAM N/A 10/25/2013   Procedure: LEFT HEART CATHETERIZATION WITH CORONARY ANGIOGRAM;  Surgeon: Troy Sine, MD;  Location: [facility] CATH LAB;  Service:  Cardiovascular;  Laterality: N/A;  . NECK SURGERY     x's 2  . TOTAL KNEE ARTHROPLASTY     Bilateral x's 2  . VAGINAL HYSTERECTOMY  10/17/1998   Dory Horn    Social History  Substance Use Topics  . Smoking status: Former Smoker    Years: 2.00    Quit date: 04/06/1969  . Smokeless tobacco: Never Used     Comment: socially smoked x 2 years  . Alcohol use No    Family History  Problem Relation Age of Onset  . Arthritis    . Colon cancer Father   . Prostate cancer Father   . HIV Brother     22  . Kidney cancer Brother   . Colon cancer Brother   . Lung cancer Brother   . Other Brother     Mouth Cancer  . Ovarian cancer Mother   . Uterine cancer Mother   . Heart disease Maternal Grandmother   . Stroke Maternal Grandmother   . Hypertension Maternal Grandmother   . Diabetes Maternal Grandmother     Allergies  Allergen Reactions  . Prednisone Other (See Comments)    Homicidal  . Atrovent Hfa [Ipratropium Bromide Hfa] Other (See Comments)    Pt could not sleep  . Cheratussin Ac [Guaifenesin-Codeine]   . Hydrocodone Itching  . Influenza Vaccines Other (See Comments)    Pt reports heart attack after last flu shot  . Motrin [Ibuprofen] Other (See Comments)    "Gives false reading in blood"    Medication list has been reviewed and updated.  Current Outpatient Prescriptions on File Prior to Visit  Medication Sig Dispense Refill  . Ascorbic Acid (VITAMIN C PO) Take 1 Dose by mouth 4 (four) times daily.    . Biotin (RA BIOTIN) 1000 MCG tablet Take 1,000 mcg by mouth 2 (two) times daily.     Marland Kitchen BREWERS YEAST PO Take 2 tablets by mouth 2 (two) times daily.     . busPIRone (BUSPAR) 15 MG tablet Take 1 tablet (15 mg total) by mouth 2 (two) times daily. 60 tablet 3  . clopidogrel (PLAVIX) 75 MG tablet TAKE 1 TABLET (75 MG TOTAL) BY MOUTH ONCE. 90 tablet 1  . dicyclomine (BENTYL) 10 MG capsule Take 1-2 capsules (10-20 mg total) by mouth 4 (four) times daily -  before meals and at  bedtime. 360 capsule 1  . estradiol (ESTRACE) 2 MG tablet Take 2 mg by mouth daily.  11  . furosemide (LASIX) 40 MG tablet TAKE 1 TABLET (40 MG TOTAL) BY MOUTH 2 (TWO) TIMES DAILY. 180 tablet 1  . KLOR-CON M20 20 MEQ tablet TAKE 2 TABLETS (40  MEQ TOTAL) BY MOUTH DAILY. 180 tablet 1  . lactulose, encephalopathy, (CHRONULAC) 10 GM/15ML SOLN TAKE 30 MLS (20 G TOTAL) BY MOUTH EVERY 6 (SIX) HOURS AS NEEDED. 1892 mL 1  . Multiple Vitamin (MULTIVITAMIN) tablet Take 1 tablet by mouth daily.    . rifaximin (XIFAXAN) 550 MG TABS tablet Take 1 tablet (550 mg total) by mouth 3 (three) times daily. 42 tablet 0  . spironolactone (ALDACTONE) 25 MG tablet Take 1 tablet (25 mg total) by mouth daily. 30 tablet 2  . SYNTHROID 112 MCG tablet TAKE 1 TABLET (112 MCG TOTAL) BY MOUTH DAILY BEFORE BREAKFAST. 90 tablet 1  . vitamin D, CHOLECALCIFEROL, 400 UNITS tablet Take 400 Units by mouth 2 (two) times daily.      No current facility-administered medications on file prior to visit.     Review of Systems:  As per HPI- otherwise negative. No fever, chest pain, SOB, rash  Physical Examination:  Blood pressure 130/74, pulse 70, temperature 98.1 F (36.7 C), temperature source Oral, height 5\' 3"  (1.6 m), weight 135 lb 3.2 oz (61.3 kg), SpO2 100 %.  GEN: WDWN, NAD, Non-toxic, A & O x 3 HEENT: Atraumatic, Normocephalic. Neck supple. No masses, No LAD. Ears and Nose: No external deformity. CV: RRR, No M/G/R. No JVD. No thrill. No extra heart sounds. PULM: CTA B, no wheezes, crackles, rhonchi. No retractions. No resp. distress. No accessory muscle use. ABD: S, NT, ND, +BS. No rebound. No HSM. EXTR: No c/c.  Mild LE edema, left more than right,  She is wearing compression, no pitting  NEURO Normal gait.  PSYCH: Normally interactive. Conversant. Not depressed or anxious appearing.  Calm demeanor.    Assessment and Plan: Hypokalemia - Plan: Basic metabolic panel  Hypotension due to drugs  Here today to  follow-up.  We had tried adding spiro to correct persistent hypokalemia due to lasix.  However she was not able to tolerate this. She is back on her K supplement. She will come in for BMP every 4-6 weeks to monitor her K BP is back to normal today- she will monitor this at home and let me know if any concerns   Signed Lamar Blinks, MD

## 2016-03-18 NOTE — Patient Instructions (Addendum)
It was nice to see you today- we will plan to check your potassium every 6 weeks or so.  We can do this as a lab visit only Continue your current lasix and potassium regimen Your BP looks just fine today   Please let me know if you have concerns about your blood pressure

## 2016-03-22 ENCOUNTER — Other Ambulatory Visit: Payer: Self-pay | Admitting: Family Medicine

## 2016-03-24 ENCOUNTER — Other Ambulatory Visit: Payer: Self-pay | Admitting: Emergency Medicine

## 2016-03-25 ENCOUNTER — Telehealth: Payer: Self-pay | Admitting: *Deleted

## 2016-03-25 ENCOUNTER — Other Ambulatory Visit: Payer: Self-pay | Admitting: Emergency Medicine

## 2016-03-25 NOTE — Telephone Encounter (Signed)
Received refill request for spironolactone (ALDACTONE) 25 MG tablet. There is a warning that states that following:  Drug-Drug: spironolactone and Klor-Con M20. Is it ok to refill? Please advise.

## 2016-03-25 NOTE — Telephone Encounter (Signed)
At our last visit she reported that she was no longer taking spiro - called her and LMOM.  Does she need this refilled?  If so please let me know and we will need to discuss monitoring her K also. For now will disregard refill request

## 2016-03-25 NOTE — Telephone Encounter (Signed)
Pt declines AWV. "I'm not doing it. I'm there all the time and Medicare knows exactly what I have."

## 2016-03-26 ENCOUNTER — Other Ambulatory Visit: Payer: Self-pay | Admitting: Family Medicine

## 2016-04-05 ENCOUNTER — Other Ambulatory Visit: Payer: Self-pay | Admitting: Family Medicine

## 2016-04-07 ENCOUNTER — Encounter: Payer: Self-pay | Admitting: Family Medicine

## 2016-04-07 ENCOUNTER — Other Ambulatory Visit: Payer: Self-pay | Admitting: Family Medicine

## 2016-04-07 ENCOUNTER — Ambulatory Visit (HOSPITAL_BASED_OUTPATIENT_CLINIC_OR_DEPARTMENT_OTHER)
Admission: RE | Admit: 2016-04-07 | Discharge: 2016-04-07 | Disposition: A | Discharge status: Home / Self Care | Payer: Medicare Other | Source: Ambulatory Visit | Attending: Family Medicine | Admitting: Family Medicine

## 2016-04-07 ENCOUNTER — Ambulatory Visit (INDEPENDENT_AMBULATORY_CARE_PROVIDER_SITE_OTHER): Payer: Medicare Other | Admitting: Family Medicine

## 2016-04-07 VITALS — BP 129/85 | HR 81 | Temp 99.0°F | Resp 16 | Ht 63.0 in | Wt 139.0 lb

## 2016-04-07 DIAGNOSIS — J189 Pneumonia, unspecified organism: Secondary | ICD-10-CM | POA: Insufficient documentation

## 2016-04-07 DIAGNOSIS — R079 Chest pain, unspecified: Secondary | ICD-10-CM | POA: Diagnosis not present

## 2016-04-07 DIAGNOSIS — R05 Cough: Secondary | ICD-10-CM | POA: Diagnosis not present

## 2016-04-07 MED ORDER — BENZONATATE 100 MG PO CAPS: 100.0000 mg | ORAL_CAPSULE | Freq: Two times a day (BID) | ORAL | 0 refills | Status: DC | PRN

## 2016-04-07 MED ORDER — LEVOFLOXACIN 500 MG PO TABS: 500.0000 mg | ORAL_TABLET | Freq: Every day | ORAL | 0 refills | Status: DC

## 2016-04-07 NOTE — Progress Notes (Signed)
Patient ID: Alexandria [name], female    DOB: [birth year]  Age: 71 y.o. MRN: [id number]    Subjective:  Subjective  HPI Alexandria [name] presents for fever 101 yesterday and mild body aches ,dry cough, nasal d/c and L ear pain since last Friday.  No sick contacts, no flu shot.  No NVD.    Review of Systems  Constitutional: Positive for chills.  HENT: Positive for congestion and ear pain. Negative for postnasal drip, rhinorrhea and sinus pressure.   Respiratory: Negative for cough, chest tightness, shortness of breath and wheezing.   Cardiovascular: Negative for chest pain, palpitations and leg swelling.  Allergic/Immunologic: Negative for environmental allergies.    History Past Medical History:  Diagnosis Date  . Arthritis   . Asthmatic bronchitis with acute exacerbation 02/06/2015  . Colon polyp   . Diverticulitis   . DVT (deep venous thrombosis) ([place])    lower extremity  . H/O cardiac catheterization 2004   Normal coronary arteries  . Heart murmur   . History of stress test 05/21/2011  . Hx of echocardiogram 01/27/2010   Normal Ef 55% the transmitral spectral doppler flow pattern is normal for age. the left ventricular wall motion is normal  . Migraines   . Thyroid disease   . TIA (transient ischemic attack)    8 years ago    She has a past surgical history that includes Vaginal hysterectomy (10/17/1998); Breast biopsy; Total knee arthroplasty; Neck surgery; Cardiac catheterization; and left heart catheterization with coronary angiogram (N/A, 10/25/2013).   Her family history includes Colon cancer in her brother and father; Diabetes in her maternal grandmother; HIV in her brother; Heart disease in her maternal grandmother; Hypertension in her maternal grandmother; Kidney cancer in her brother; Lung cancer in her brother; Other in her brother; Ovarian cancer in her mother; Prostate cancer in her father; Stroke in her maternal grandmother; Uterine cancer in her mother.She  reports that she quit smoking about 47 years ago. She quit after 2.00 years of use. She has never used smokeless tobacco. She reports that she does not drink alcohol or use drugs.  Current Outpatient Prescriptions on File Prior to Visit  Medication Sig Dispense Refill  . Ascorbic Acid (VITAMIN C PO) Take 1 Dose by mouth 4 (four) times daily.    . Biotin (RA BIOTIN) 1000 MCG tablet Take 1,000 mcg by mouth 2 (two) times daily.     Marland Kitchen BREWERS YEAST PO Take 2 tablets by mouth 2 (two) times daily.     . busPIRone (BUSPAR) 15 MG tablet Take 1 tablet (15 mg total) by mouth 2 (two) times daily. 60 tablet 3  . clopidogrel (PLAVIX) 75 MG tablet TAKE 1 TABLET BY MOUTH EVERY DAY 90 tablet 1  . dicyclomine (BENTYL) 10 MG capsule TAKE 1-2 CAPSULES (10-20 MG TOTAL) BY MOUTH 4 (FOUR) TIMES DAILY - BEFORE MEALS AND AT BEDTIME. 360 capsule 0  . estradiol (ESTRACE) 2 MG tablet Take 2 mg by mouth daily.  11  . furosemide (LASIX) 40 MG tablet TAKE 1 TABLET (40 MG TOTAL) BY MOUTH 2 (TWO) TIMES DAILY. 180 tablet 1  . KLOR-CON M20 20 MEQ tablet TAKE 2 TABLETS (40 MEQ TOTAL) BY MOUTH DAILY. 180 tablet 1  . lactulose, encephalopathy, (CHRONULAC) 10 GM/15ML SOLN TAKE 30 MLS (20 G TOTAL) BY MOUTH EVERY 6 (SIX) HOURS AS NEEDED. 1892 mL 1  . Multiple Vitamin (MULTIVITAMIN) tablet Take 1 tablet by mouth daily.    Marland Kitchen SYNTHROID 112 MCG tablet  TAKE 1 TABLET BY MOUTH BEFORE BREAKFAST 90 tablet 1  . vitamin D, CHOLECALCIFEROL, 400 UNITS tablet Take 400 Units by mouth 2 (two) times daily.      No current facility-administered medications on file prior to visit.      Objective:  Objective  Physical Exam  Constitutional: She is oriented to person, place, and time. She appears well-developed and well-nourished.  HENT:  Right Ear: External ear normal.  Left Ear: External ear normal.  + PND + errythema  Eyes: Conjunctivae are normal. Right eye exhibits no discharge. Left eye exhibits no discharge.  Cardiovascular: Normal rate,  regular rhythm and normal heart sounds.   No murmur heard. Pulmonary/Chest: Effort normal. No respiratory distress. She has no wheezes. She has rales.     She exhibits no tenderness.  Musculoskeletal: She exhibits no edema.  Lymphadenopathy:    She has cervical adenopathy.  Neurological: She is alert and oriented to person, place, and time.  Nursing note and vitals reviewed.  BP 129/85 (BP Location: Right Arm, Cuff Size: Normal)   Pulse 81   Temp 99 F (37.2 C) (Oral)   Resp 16   Ht 5\' 3"  (1.6 m)   Wt 139 lb (63 kg)   SpO2 95%   BMI 24.62 kg/m  Wt Readings from Last 3 Encounters:  04/07/16 139 lb (63 kg)  03/18/16 135 lb 3.2 oz (61.3 kg)  02/26/16 136 lb 6.4 oz (61.9 kg)     Lab Results  Component Value Date   WBC 4.3 12/04/2015   HGB 14.2 12/04/2015   HCT 41.6 12/04/2015   PLT 188.0 12/04/2015   GLUCOSE 98 03/10/2016   CHOL 169 12/13/2015   TRIG 65 12/13/2015   HDL 73 (A) 12/13/2015   LDLCALC 83 12/13/2015   ALT 24 02/26/2016   AST 25 02/26/2016   NA 139 03/10/2016   K 3.4 (L) 03/10/2016   CL 101 03/10/2016   CREATININE 0.95 03/10/2016   BUN 13 03/10/2016   CO2 32 03/10/2016   TSH 1.99 02/26/2016   INR 1.1 (H) 05/07/2015    Dg Chest 2 View  Result Date: 02/26/2016 CLINICAL DATA:  Routine chest films.  Smoker. EXAM: CHEST  2 VIEW COMPARISON:  PA and lateral chest 02/11/2015 and 01/01/2014. FINDINGS: Lungs are clear. Heart size is normal. No pneumothorax or pleural fluid. Aortic atherosclerosis is noted. Marked convex left thoracolumbar scoliosis and multilevel spondylosis are seen. IMPRESSION: No acute disease. Electronically Signed   By: Inge Rise M.D.   On: 02/26/2016 12:46   Dg Knee Complete 4 Views Left  Result Date: 02/26/2016 CLINICAL DATA:  LEFT knee pain for 2 months, knee swelling, no known injury, prior knee replacement in 2010 EXAM: LEFT KNEE - COMPLETE 4+ VIEW COMPARISON:  None FINDINGS: Osseous demineralization. Components of a LEFT  knee prosthesis are identified in expected positions. No periprosthetic lucency. No acute fracture, dislocation, or bone destruction. No definite knee joint effusion. Soft tissues grossly unremarkable. IMPRESSION: Osseous demineralization and LEFT knee prosthesis without acute abnormalities. Electronically Signed   By: Lavonia Dana M.D.   On: 02/26/2016 13:07     Assessment & Plan:  Plan  I have discontinued Ms. Dewberry's rifaximin, spironolactone, and spironolactone. I am also having her maintain her vitamin D (CHOLECALCIFEROL), BREWERS YEAST PO, multivitamin, Biotin, estradiol, Ascorbic Acid (VITAMIN C PO), furosemide, lactulose (encephalopathy), busPIRone, KLOR-CON M20, SYNTHROID, clopidogrel, and dicyclomine.  No orders of the defined types were placed in this encounter.   Problem List  Items Addressed This Visit    None      Follow-up: No Follow-up on file.  [name], DO

## 2016-04-07 NOTE — Progress Notes (Signed)
Pre visit review using our clinic review tool, if applicable. No additional management support is needed unless otherwise documented below in the visit note. 

## 2016-04-07 NOTE — Patient Instructions (Signed)
Community-Acquired Pneumonia, Adult °Introduction °Pneumonia is an infection of the lungs. One type of pneumonia can happen while a person is in a hospital. A different type can happen when a person is not in a hospital (community-acquired pneumonia). It is easy for this kind to spread from person to person. It can spread to you if you breathe near an infected person who coughs or sneezes. Some symptoms include: °· A dry cough. °· A wet (productive) cough. °· Fever. °· Sweating. °· Chest pain. °Follow these instructions at home: °· Take over-the-counter and prescription medicines only as told by your doctor. °¨ Only take cough medicine if you are losing sleep. °¨ If you were prescribed an antibiotic medicine, take it as told by your doctor. Do not stop taking the antibiotic even if you start to feel better. °· Sleep with your head and neck raised (elevated). You can do this by putting a few pillows under your head, or you can sleep in a recliner. °· Do not use tobacco products. These include cigarettes, chewing tobacco, and e-cigarettes. If you need help quitting, ask your doctor. °· Drink enough water to keep your pee (urine) clear or pale yellow. °A shot (vaccine) can help prevent pneumonia. Shots are often suggested for: °· People older than 72 years of age. °· People older than 72 years of age: °¨ Who are having cancer treatment. °¨ Who have long-term (chronic) lung disease. °¨ Who have problems with their body's defense system (immune system). °You may also prevent pneumonia if you take these actions: °· Get the flu (influenza) shot every year. °· Go to the dentist as often as told. °· Wash your hands often. If soap and water are not available, use hand sanitizer. °Contact a doctor if: °· You have a fever. °· You lose sleep because your cough medicine does not help. °Get help right away if: °· You are short of breath and it gets worse. °· You have more chest pain. °· Your sickness gets worse. This is very  serious if: °¨ You are an older adult. °¨ Your body's defense system is weak. °· You cough up blood. °This information is not intended to replace advice given to you by your health care provider. Make sure you discuss any questions you have with your health care provider. °Document Released: 09/09/2007 Document Revised: 08/29/2015 Document Reviewed: 07/18/2014 °© 2017 Elsevier ° °

## 2016-04-08 ENCOUNTER — Telehealth: Payer: Self-pay | Admitting: Family Medicine

## 2016-04-08 NOTE — Telephone Encounter (Signed)
Patient informed of xray results/instructions dated 04/07/2016

## 2016-04-08 NOTE — Telephone Encounter (Signed)
Relation to [id number] Call back number:[phone]   Reason for call:  Patient returning call regarding imaging results

## 2016-04-09 ENCOUNTER — Emergency Department (HOSPITAL_BASED_OUTPATIENT_CLINIC_OR_DEPARTMENT_OTHER): Payer: Medicare Other

## 2016-04-09 ENCOUNTER — Emergency Department (HOSPITAL_BASED_OUTPATIENT_CLINIC_OR_DEPARTMENT_OTHER)
Admission: EM | Admit: 2016-04-09 | Discharge: 2016-04-09 | Disposition: A | Discharge status: Home / Self Care | Payer: Medicare Other | Attending: Emergency Medicine | Admitting: Emergency Medicine

## 2016-04-09 ENCOUNTER — Encounter (HOSPITAL_BASED_OUTPATIENT_CLINIC_OR_DEPARTMENT_OTHER): Payer: Self-pay | Admitting: *Deleted

## 2016-04-09 ENCOUNTER — Encounter: Payer: Self-pay | Admitting: Family Medicine

## 2016-04-09 ENCOUNTER — Ambulatory Visit (INDEPENDENT_AMBULATORY_CARE_PROVIDER_SITE_OTHER): Payer: Medicare Other | Admitting: Family Medicine

## 2016-04-09 ENCOUNTER — Emergency Department (HOSPITAL_COMMUNITY): Payer: Medicare Other

## 2016-04-09 VITALS — BP 126/80 | HR 75 | Temp 97.8°F | Ht 63.0 in | Wt 140.0 lb

## 2016-04-09 DIAGNOSIS — R05 Cough: Secondary | ICD-10-CM | POA: Diagnosis not present

## 2016-04-09 DIAGNOSIS — R202 Paresthesia of skin: Secondary | ICD-10-CM | POA: Insufficient documentation

## 2016-04-09 DIAGNOSIS — E039 Hypothyroidism, unspecified: Secondary | ICD-10-CM | POA: Diagnosis not present

## 2016-04-09 DIAGNOSIS — Z86718 Personal history of other venous thrombosis and embolism: Secondary | ICD-10-CM | POA: Diagnosis not present

## 2016-04-09 DIAGNOSIS — R0789 Other chest pain: Secondary | ICD-10-CM | POA: Insufficient documentation

## 2016-04-09 DIAGNOSIS — Z87891 Personal history of nicotine dependence: Secondary | ICD-10-CM | POA: Insufficient documentation

## 2016-04-09 DIAGNOSIS — R51 Headache: Secondary | ICD-10-CM | POA: Diagnosis not present

## 2016-04-09 DIAGNOSIS — Z8673 Personal history of transient ischemic attack (TIA), and cerebral infarction without residual deficits: Secondary | ICD-10-CM | POA: Insufficient documentation

## 2016-04-09 DIAGNOSIS — R200 Anesthesia of skin: Secondary | ICD-10-CM | POA: Diagnosis not present

## 2016-04-09 DIAGNOSIS — R0602 Shortness of breath: Secondary | ICD-10-CM | POA: Diagnosis not present

## 2016-04-09 DIAGNOSIS — Z8679 Personal history of other diseases of the circulatory system: Secondary | ICD-10-CM | POA: Diagnosis not present

## 2016-04-09 DIAGNOSIS — R079 Chest pain, unspecified: Secondary | ICD-10-CM | POA: Diagnosis not present

## 2016-04-09 LAB — CBC WITH DIFFERENTIAL/PLATELET
Basophils Absolute: 0 10*3/uL (ref 0.0–0.1)
Basophils Relative: 0 %
EOS PCT: 3 %
Eosinophils Absolute: 0.1 10*3/uL (ref 0.0–0.7)
HEMATOCRIT: 41.7 % (ref 36.0–46.0)
Hemoglobin: 14.5 g/dL (ref 12.0–15.0)
LYMPHS ABS: 2.4 10*3/uL (ref 0.7–4.0)
LYMPHS PCT: 51 %
MCH: 33 pg (ref 26.0–34.0)
MCHC: 34.8 g/dL (ref 30.0–36.0)
MCV: 94.8 fL (ref 78.0–100.0)
Monocytes Absolute: 0.7 10*3/uL (ref 0.1–1.0)
Monocytes Relative: 14 %
Neutro Abs: 1.6 10*3/uL — ABNORMAL LOW (ref 1.7–7.7)
Neutrophils Relative %: 32 %
PLATELETS: 211 10*3/uL (ref 150–400)
RBC: 4.4 MIL/uL (ref 3.87–5.11)
RDW: 12.7 % (ref 11.5–15.5)
WBC: 4.8 10*3/uL (ref 4.0–10.5)

## 2016-04-09 LAB — URINALYSIS, ROUTINE W REFLEX MICROSCOPIC
BILIRUBIN URINE: NEGATIVE
Glucose, UA: NEGATIVE mg/dL
Hgb urine dipstick: NEGATIVE
Ketones, ur: NEGATIVE mg/dL
Leukocytes, UA: NEGATIVE
NITRITE: NEGATIVE
PH: 7.5 (ref 5.0–8.0)
Protein, ur: NEGATIVE mg/dL
SPECIFIC GRAVITY, URINE: 1.004 — AB (ref 1.005–1.030)

## 2016-04-09 LAB — COMPREHENSIVE METABOLIC PANEL
ALT: 23 U/L (ref 14–54)
AST: 35 U/L (ref 15–41)
Albumin: 4.3 g/dL (ref 3.5–5.0)
Alkaline Phosphatase: 68 U/L (ref 38–126)
Anion gap: 14 (ref 5–15)
BUN: 12 mg/dL (ref 6–20)
CHLORIDE: 101 mmol/L (ref 101–111)
CO2: 24 mmol/L (ref 22–32)
CREATININE: 1.01 mg/dL — AB (ref 0.44–1.00)
Calcium: 9.3 mg/dL (ref 8.9–10.3)
GFR calc non Af Amer: 55 mL/min — ABNORMAL LOW (ref >60)
Glucose, Bld: 103 mg/dL — ABNORMAL HIGH (ref 65–99)
Potassium: 3.3 mmol/L — ABNORMAL LOW (ref 3.5–5.1)
SODIUM: 139 mmol/L (ref 135–145)
Total Bilirubin: 0.4 mg/dL (ref 0.3–1.2)
Total Protein: 7.7 g/dL (ref 6.5–8.1)

## 2016-04-09 LAB — MAGNESIUM: Magnesium: 2.2 mg/dL (ref 1.7–2.4)

## 2016-04-09 LAB — TROPONIN I: Troponin I: <0.03 ng/mL (ref <0.03)

## 2016-04-09 LAB — LIPASE, BLOOD: Lipase: 22 U/L (ref 11–51)

## 2016-04-09 MED ORDER — KETOROLAC TROMETHAMINE 30 MG/ML IJ SOLN
30.0000 mg | Freq: Once | INTRAMUSCULAR | Status: AC
  Administered 2016-04-09: 30 mg via INTRAVENOUS
  Filled 2016-04-09: qty 1

## 2016-04-09 MED ORDER — ALBUTEROL SULFATE (2.5 MG/3ML) 0.083% IN NEBU
2.5000 mg | INHALATION_SOLUTION | Freq: Once | RESPIRATORY_TRACT | Status: AC
  Administered 2016-04-09: 2.5 mg via RESPIRATORY_TRACT

## 2016-04-09 NOTE — ED Provider Notes (Signed)
8:49 PM Patient feels much better this time.  MRI demonstrates no acute stroke.  Primary care follow-up.  Patient understands return to the ER for new or worsening symptoms  Dg Chest 2 View  Result Date: 04/09/2016 CLINICAL DATA:  Shortness of breath with cough and chest pain. Fever. EXAM: CHEST  2 VIEW COMPARISON:  April 07, 2016 FINDINGS: There is no edema or consolidation. Heart size and pulmonary vascularity are normal. No adenopathy. There is atherosclerotic calcification in the aorta. There is postoperative change in the lower cervical region. There is thoracolumbar levoscoliosis. IMPRESSION: No edema or consolidation.  Aortic atherosclerosis. Electronically Signed   By: Lowella Grip III M.D.   On: 04/09/2016 13:38   Dg Chest 2 View  Result Date: 04/07/2016 CLINICAL DATA:  Patient c/o cough since 04/03/16, some chest pains when coughing, rattling in [place], non-smoker, no other complaints EXAM: CHEST  2 VIEW COMPARISON:  02/26/2016 FINDINGS: Cardiac silhouette is normal in size. No mediastinal hilar masses. No evidence of adenopathy. There are prominent bronchovascular markings with mild lung base interstitial thickening, stable. Lungs otherwise clear. No pleural effusion. No pneumothorax. Skeletal structures are demineralized but grossly intact. IMPRESSION: No acute cardiopulmonary disease. Electronically Signed   By: Lajean Manes M.D.   On: 04/07/2016 14:15   Ct Head Wo Contrast  Result Date: 04/09/2016 CLINICAL DATA:  Headache EXAM: CT HEAD WITHOUT CONTRAST TECHNIQUE: Contiguous axial images were obtained from the base of the skull through the vertex without intravenous contrast. COMPARISON:  Head CT July 13, 2005 and brain MRI December 07, 2015 FINDINGS: Brain: There is age related volume loss. There is a cavum septum pellucidum, an anatomic variant. There is no intracranial mass, hemorrhage, extra-axial fluid collection, or midline shift. There is mild patchy small vessel disease in the  centra semiovale bilaterally. Elsewhere gray-white compartments appear normal. No acute infarct evident. Vascular: Per dense vessel. There is calcification in each carotid siphon region. Skull: Bony calvarium appears intact. Sinuses/Orbits: There is mild mucosal thickening in several ethmoid air cells bilaterally. Other paranasal sinuses are clear. Orbits appear symmetric bilaterally. Other: Mastoid air cells are clear. IMPRESSION: Age related volume loss with mild 3 periventricular small vessel disease. No intracranial mass, hemorrhage, or extra-axial fluid collection. No acute infarct evident. There are foci of arterial vascular calcification. There is mild ethmoid sinus disease bilaterally. Electronically Signed   By: Lowella Grip III M.D.   On: 04/09/2016 13:26   Mr Brain Wo Contrast  Result Date: 04/09/2016 CLINICAL DATA:  71 year old female with left facial numbness, shortness of breath. Initial encounter. EXAM: MRI HEAD WITHOUT CONTRAST TECHNIQUE: Multiplanar, multiecho pulse sequences of the brain and surrounding structures were obtained without intravenous contrast. COMPARISON:  Head CT 1317 hours today.  Brain MRI 12/07/2015. FINDINGS: Brain: Stable cerebral volume. Cavum septum pellucidum (normal variant) again noted. No restricted diffusion to suggest acute infarction. No midline shift, mass effect, evidence of mass lesion, ventriculomegaly, extra-axial collection or acute intracranial hemorrhage. Cervicomedullary junction and pituitary are within normal limits. Stable gray and white matter signal throughout the brain. Mild or at most moderate for age scattered small foci of nonspecific T2 and FLAIR hyperintensity in both cerebral hemispheres. No cortical encephalomalacia. No chronic cerebral blood products. Deep gray matter nuclei, brainstem, and right cerebellar hemisphere remain normal for age. 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 Normal noncontrast appearance of the cavernous sinus. Vascular: Major intracranial vascular flow voids are stable and appear normal.  Skull and upper cervical spine: Negative visualized cervical spine. Stable bone marrow signal, within normal limits. There are 2 small benign-appearing left frontal bone lesions which may be arachnoid granulations (series 10, image 25 and image 20). Sinuses/Orbits: Normal orbits soft tissues. Trace paranasal sinus mucosal thickening. Visualized paranasal sinuses and mastoids are stable and well pneumatized. Other: Visible internal auditory structures appear normal. Negative scalp soft tissues. IMPRESSION: 1.  No acute intracranial abnormality. 2. Stable noncontrast MRI appearance of the brain since September 2017. Nonspecific signal changes compatible with mild to moderate for age chronic small vessel disease. Electronically Signed   By: Genevie Ann M.D.   On: 04/09/2016 19:05      Jola Schmidt, MD 04/09/16 2050

## 2016-04-09 NOTE — Progress Notes (Signed)
[place] [facility] [address], [place], [place] [address] [phone]  Date:  04/09/2016   Name:  Alexandria [name]   DOB:  11-Sep-1945   MRN:  [id number]  PCP:  [name], DO    Chief Complaint: Cough (c/o dry cough and SOB. sx's started last Friday. )   History of Present Illness:  Alexandria [name] is a 71 y.o. very pleasant female patient who presents with the following:  She was seen 2 days ago with dx of CAP- started on levquin for 5 days.   Her CXR was read as normal- clinical dx of pneumonia  She notes that she has a tightness in her chest and some difficulty breathing.   She has not noted any wheezing She has still had some fevers- up to 102 over the last several days, maybe 101 the last 2 dyas She feels very fatigued She has runny nose, sneezing, no ST She had a good BM today- no vomiting or diarrhea appetite is still pretty good  She has been sick for 6 days overall   She has used albuterol inhaler in the past.  Does not have one at home now however   Patient Active Problem List   Diagnosis Date Noted  . Insomnia 11/18/2015  . Epistaxis 11/18/2015  . Normal coronary arteries 05/20/2015  . Chest pain at rest 05/20/2015  . RBBB 05/20/2015  . Abnormal CXR 05/20/2015  . History of MI (myocardial infarction) 05/08/2015  . Bruising 05/08/2015  . Upper airway cough syndrome 02/14/2015  . Asthmatic bronchitis with acute exacerbation 02/06/2015  . Decreased urination 12/28/2014  . Hypothyroidism 10/25/2014  . Fatigue 10/25/2014  . 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 Chronic venous insufficiency 10/24/2014  . Varicose veins of leg with complications [id number]  . Degeneration of intervertebral disc of cervical region 06/29/2014  . Palpitations 11/08/2013  . SOB (shortness of breath) 10/03/2013  . DOE (dyspnea on exertion) 10/03/2013  . Edema of both legs 10/03/2013  . Hair loss 11/25/2012   . Falling hair 11/25/2012  . Diverticulitis 07/14/2012  . Hammer toe 06/13/2012  . Recurrent abdominal pain 04/17/2012  . Hypoglycemia 01/06/2012    Past Medical History:  Diagnosis Date  . Arthritis   . Asthmatic bronchitis with acute exacerbation 02/06/2015  . Colon polyp   . Diverticulitis   . DVT (deep venous thrombosis) ([place])    lower extremity  . H/O cardiac catheterization 2004   Normal coronary arteries  . Heart murmur   . History of stress test 05/21/2011  . Hx of echocardiogram 01/27/2010   Normal Ef 55% the transmitral spectral doppler flow pattern is normal for age. the left ventricular wall motion is normal  . Migraines   . Thyroid disease   . TIA (transient ischemic attack)    8 years ago    Past Surgical History:  Procedure Laterality Date  . BREAST BIOPSY     Isaiah Blakes  . CARDIAC CATHETERIZATION     10/2013  . LEFT HEART CATHETERIZATION WITH CORONARY ANGIOGRAM N/A 10/25/2013   Procedure: LEFT HEART CATHETERIZATION WITH CORONARY ANGIOGRAM;  Surgeon: Troy Sine, MD;  Location: [facility] CATH LAB;  Service: Cardiovascular;  Laterality: N/A;  . NECK SURGERY     x's 2  . TOTAL KNEE ARTHROPLASTY     Bilateral x's 2  . VAGINAL HYSTERECTOMY  10/17/1998   Dory Horn    Social History  Substance Use  Topics  . Smoking status: Former Smoker    Years: 2.00    Quit date: 04/06/1969  . Smokeless tobacco: Never Used     Comment: socially smoked x 2 years  . Alcohol use No    Family History  Problem Relation Age of Onset  . Arthritis    . Colon cancer Father   . Prostate cancer Father   . HIV Brother     97  . Kidney cancer Brother   . Colon cancer Brother   . Lung cancer Brother   . Other Brother     Mouth Cancer  . Ovarian cancer Mother   . Uterine cancer Mother   . Heart disease Maternal Grandmother   . Stroke Maternal Grandmother   . Hypertension Maternal Grandmother   . Diabetes Maternal Grandmother     Allergies  Allergen Reactions  . Prednisone  Other (See Comments)    Homicidal  . Atrovent Hfa [Ipratropium Bromide Hfa] Other (See Comments)    Pt could not sleep  . Cheratussin Ac [Guaifenesin-Codeine]   . Hydrocodone Itching  . Influenza Vaccines Other (See Comments)    Pt reports heart attack after last flu shot  . Motrin [Ibuprofen] Other (See Comments)    "Gives false reading in blood"    Medication list has been reviewed and updated.  Current Outpatient Prescriptions on File Prior to Visit  Medication Sig Dispense Refill  . Ascorbic Acid (VITAMIN C PO) Take 1 Dose by mouth 4 (four) times daily.    . benzonatate (TESSALON) 100 MG capsule Take 1 capsule (100 mg total) by mouth 2 (two) times daily as needed for cough. 20 capsule 0  . Biotin (RA BIOTIN) 1000 MCG tablet Take 1,000 mcg by mouth 2 (two) times daily.     Marland Kitchen BREWERS YEAST PO Take 2 tablets by mouth 2 (two) times daily.     . busPIRone (BUSPAR) 15 MG tablet Take 1 tablet (15 mg total) by mouth 2 (two) times daily. 60 tablet 3  . clopidogrel (PLAVIX) 75 MG tablet TAKE 1 TABLET BY MOUTH EVERY DAY 90 tablet 1  . dicyclomine (BENTYL) 10 MG capsule TAKE 1-2 CAPSULES (10-20 MG TOTAL) BY MOUTH 4 (FOUR) TIMES DAILY - BEFORE MEALS AND AT BEDTIME. 360 capsule 0  . estradiol (ESTRACE) 2 MG tablet Take 2 mg by mouth daily.  11  . furosemide (LASIX) 40 MG tablet TAKE 1 TABLET (40 MG TOTAL) BY MOUTH 2 (TWO) TIMES DAILY. 180 tablet 1  . KLOR-CON M20 20 MEQ tablet TAKE 2 TABLETS (40 MEQ TOTAL) BY MOUTH DAILY. 180 tablet 1  . lactulose, encephalopathy, (CHRONULAC) 10 GM/15ML SOLN TAKE 30 MLS (20 G TOTAL) BY MOUTH EVERY 6 (SIX) HOURS AS NEEDED. 1892 mL 1  . levofloxacin (LEVAQUIN) 500 MG tablet Take 1 tablet (500 mg total) by mouth daily. 7 tablet 0  . Multiple Vitamin (MULTIVITAMIN) tablet Take 1 tablet by mouth daily.    Marland Kitchen SYNTHROID 112 MCG tablet TAKE 1 TABLET BY MOUTH BEFORE BREAKFAST 90 tablet 1  . vitamin D, CHOLECALCIFEROL, 400 UNITS tablet Take 400 Units by mouth 2 (two) times  daily.      No current facility-administered medications on file prior to visit.     Review of Systems:  As per HPI- otherwise negative.   Physical Examination: Blood pressure 126/80, pulse 75, temperature 97.8 F (36.6 C), temperature source Oral, height 5\' 3"  (1.6 m), weight 140 lb (63.5 kg), SpO2 99 %.  Vitals:  04/09/16 1140  Weight: 140 lb (63.5 kg)  Height: 5\' 3"  (1.6 m)   Body mass index is 24.8 kg/m. Ideal Body Weight: Weight in (lb) to have BMI = 25: 140.8  GEN: WDWN, NAD, Non-toxic, A & O x 3, appears mildly ill - dry lips, not wearing make- up as she normally does  HEENT: Atraumatic, Normocephalic. Neck supple. No masses, No LAD.  Bilateral TM wnl, oropharynx normal.  PEERL,EOMI.   Ears and Nose: No external deformity. CV: RRR, No M/G/R. No JVD. No thrill. No extra heart sounds. PULM: CTA B, no wheezes, crackles, rhonchi. No retractions. No resp. distress. No accessory muscle use. ABD: S, NT, ND. No rebound. No HSM. EXTR: No c/c/e NEURO Normal gait.  PSYCH: Normally interactive. Conversant. Not depressed or anxious appearing.  Calm demeanor.   Given an albuterol neb- she then had complaint of feeling tingly in her left arm and her head hurting a lot.   Stopped neb about 1/2 way through as she no longer felt well.  She then had complaint of numbness in hands and arms, that her head hurt, and that her teeth hurt.  Also chest pain.    Was not able to speak , laying on table with eyes closed, breathing heavily, required nearly full assist to transfer to [facility] Assessment and Plan: SOB (shortness of breath) - Plan: albuterol (PROVENTIL) (2.5 MG/3ML) 0.083% nebulizer solution 2.5 mg  Here today with complaint of SOB Treated with albuterol neb- however she developed symptoms as above mid- way through treatment.  Stopped neb, urgently transferred to ER in [facility].  Suspect panic attack but will need evaluation for other etiology   Signed Lamar Blinks, MD

## 2016-04-09 NOTE — ED Notes (Signed)
Pt left POV to [facility] with husband.  Report called to Cecille Rubin, Agricultural consultant.  IV in places and wrapped for transport.

## 2016-04-09 NOTE — ED Notes (Signed)
Patient transported to X-ray and CT 

## 2016-04-09 NOTE — ED Triage Notes (Signed)
Patient is alert and oriented x4.  She was brought to the ED from her PCP in the building for shortness of breath.  The patient has recently been Dx with CAP and was in seeing her doctor.  Patient was given albuterol by her PCP and during the breathing Tx she started to have jitters with numbness in tingling in her upper extremities and face.  Currently she is complaining of a headache 9 of 10.

## 2016-04-09 NOTE — ED Provider Notes (Signed)
Emergency Department Provider Note   I have reviewed the triage vital signs and the nursing notes.   HISTORY  Chief Complaint Shortness of Breath   Alexandria [name] is a 71 y.o. female with PMH of DVT, thyroid disease, and TIA presents to the emergency department from her primary care physician's office for evaluation of sudden onset chest pressure, dyspnea, and bilateral arm and leg numbness/weakness. Patient is also complaining of some total face tingling and numbness. She denies any history of prior symptoms. After my discussion with the primary care physician she was being treated as an outpatient for pneumonia and return today for follow-up. She was given an albuterol nebulizer treatment in the office which she's had before. During the treatment she began having sudden onset symptoms described as above and she was transported to the emergency department.  The patient states that her pneumonia symptoms have continued since starting antibiotics. She denied significant chest pain prior to the albuterol treatment. She denies any history of anxiety. She describes bilateral numbness and weakness with no unilateral complaints.   Past Medical History:  Diagnosis Date  . Arthritis   . Asthmatic bronchitis with acute exacerbation 02/06/2015  . Colon polyp   . Diverticulitis   . DVT (deep venous thrombosis) ([place])    lower extremity  . H/O cardiac catheterization 2004   Normal coronary arteries  . Heart murmur   . History of stress test 05/21/2011  . Hx of echocardiogram 01/27/2010   Normal Ef 55% the transmitral spectral doppler flow pattern is normal for age. the left ventricular wall motion is normal  . Migraines   . Thyroid disease   . TIA (transient ischemic attack)    8 years ago    Patient Active Problem List   Diagnosis Date Noted  . Insomnia 11/18/2015  . Epistaxis 11/18/2015  . Normal coronary arteries 05/20/2015  . Chest pain at rest 05/20/2015  . RBBB 05/20/2015   . Abnormal CXR 05/20/2015  . History of MI (myocardial infarction) 05/08/2015  . Bruising 05/08/2015  . Upper airway cough syndrome 02/14/2015  . Asthmatic bronchitis with acute exacerbation 02/06/2015  . Decreased urination 12/28/2014  . Hypothyroidism 10/25/2014  . Fatigue 10/25/2014  . 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 Chronic venous insufficiency 10/24/2014  . Varicose veins of leg with complications 123XX123  . Degeneration of intervertebral disc of cervical region 06/29/2014  . Palpitations 11/08/2013  . SOB (shortness of breath) 10/03/2013  . DOE (dyspnea on exertion) 10/03/2013  . Edema of both legs 10/03/2013  . Hair loss 11/25/2012  . Falling hair 11/25/2012  . Diverticulitis 07/14/2012  . Hammer toe 06/13/2012  . Recurrent abdominal pain 04/17/2012  . Hypoglycemia 01/06/2012    Past Surgical History:  Procedure Laterality Date  . BREAST BIOPSY     Isaiah Blakes  . CARDIAC CATHETERIZATION     10/2013  . LEFT HEART CATHETERIZATION WITH CORONARY ANGIOGRAM N/A 10/25/2013   Procedure: LEFT HEART CATHETERIZATION WITH CORONARY ANGIOGRAM;  Surgeon: Troy Sine, MD;  Location: [facility] CATH LAB;  Service: Cardiovascular;  Laterality: N/A;  . NECK SURGERY     x's 2  . TOTAL KNEE ARTHROPLASTY     Bilateral x's 2  . VAGINAL HYSTERECTOMY  10/17/1998   Dory Horn    Current Outpatient Rx  . Order #: [id number] Class: Historical Med  . Order #: [id number] Class: Normal  . Order #: [id number] Class: Historical Med  . Order #: [id number] Class: Historical Med  . Order #: [id number] Class: Normal  .  Order #: [id number] Class: Normal  . Order #: [id number] Class: Normal  . Order #: [id number] Class: Historical Med  . Order #: [id number] Class: Normal  . Order #: [id number] Class: Normal  . Order #: [id number] Class: Normal  . Order #: [id number] Class: Normal  . Order #: [id number] Class: Historical Med  . Order #: [id number] Class: Normal  . Order #: [id number] Class: Historical Med     Allergies Prednisone; Atrovent hfa [ipratropium bromide hfa]; Cheratussin ac [guaifenesin-codeine]; Hydrocodone; Influenza vaccines; and Motrin [ibuprofen]  Family History  Problem Relation Age of Onset  . Arthritis    . Colon cancer Father   . Prostate cancer Father   . HIV Brother     18  . Kidney cancer Brother   . Colon cancer Brother   . Lung cancer Brother   . Other Brother     Mouth Cancer  . Ovarian cancer Mother   . Uterine cancer Mother   . Heart disease Maternal Grandmother   . Stroke Maternal Grandmother   . Hypertension Maternal Grandmother   . Diabetes Maternal Grandmother     Social History Social History  Substance Use Topics  . Smoking status: Former Smoker    Years: 2.00    Quit date: 04/06/1969  . Smokeless tobacco: Never Used     Comment: socially smoked x 2 years  . Alcohol use No    Review of Systems  Constitutional: No fever/chills Eyes: No visual changes. ENT: No sore throat. Cardiovascular: Positive chest pain. Respiratory: Positive shortness of breath. Gastrointestinal: No abdominal pain.  No nausea, no vomiting.  No diarrhea.  No constipation. Genitourinary: Negative for dysuria. Musculoskeletal: Negative for back pain. Skin: Negative for rash. Neurological: Negative for HA and generalized weakness and numbness.   10-point ROS otherwise negative.  ____________________________________________   PHYSICAL EXAM:  VITAL SIGNS: ED Triage Vitals [04/09/16 1228]  Enc Vitals Group     BP 163/87     Pulse Rate 84     Resp 24     Temp 97.5 F (36.4 C)     Temp Source Oral     SpO2 100 %     Weight 139 lb (63 kg)   Constitutional: Alter but generally shaky and not giving a detailed history. Appears anxious.  Eyes: Conjunctivae are normal. PERRL. EOMI. Head: Atraumatic. Nose: No congestion/rhinnorhea. Mouth/Throat: Mucous membranes are moist.  Oropharynx non-erythematous. Neck: No stridor.   Cardiovascular: Normal rate,  regular rhythm. Good peripheral circulation. Grossly normal heart sounds.   Respiratory: Normal respiratory effort.  No retractions. Lungs CTAB. Gastrointestinal: Soft and nontender. No distention.  Musculoskeletal: No lower extremity tenderness nor edema. No gross deformities of extremities. Neurologic:  Normal speech and language. Patient initially with generalized weakness/numbness but nothing objectively found.  Skin:  Skin is warm, dry and intact. No rash noted.  ____________________________________________   LABS (all labs ordered are listed, but only abnormal results are displayed)  Labs Reviewed  COMPREHENSIVE METABOLIC PANEL - Abnormal; Notable for the following:       Result Value   Potassium 3.3 (*)    Glucose, Bld 103 (*)    Creatinine, Ser 1.01 (*)    GFR calc non Af Amer 55 (*)    All other components within normal limits  CBC WITH DIFFERENTIAL/PLATELET - Abnormal; Notable for the following:    Neutro Abs 1.6 (*)    All other components within normal limits  URINALYSIS, ROUTINE W REFLEX MICROSCOPIC - Abnormal; Notable for the following:    Specific Gravity, Urine 1.004 (*)  All other components within normal limits  LIPASE, BLOOD  TROPONIN I  MAGNESIUM   ____________________________________________  EKG   EKG Interpretation  Date/Time:  Thursday April 09 2016 12:48:04 EST Ventricular Rate:  78 PR Interval:    QRS Duration: 142 QT Interval:  433 QTC Calculation: 494 R Axis:   38 Text Interpretation:  Sinus rhythm Right bundle branch block No STEMI.  Confirmed by Jodeci Roarty MD, Leylany Nored [phone]) on 04/09/2016 1:25:41 PM       ____________________________________________  RADIOLOGY  Dg Chest 2 View  Result Date: 04/09/2016 CLINICAL DATA:  Shortness of breath with cough and chest pain. Fever. EXAM: CHEST  2 VIEW COMPARISON:  April 07, 2016 FINDINGS: There is no edema or consolidation. Heart size and pulmonary vascularity are normal. No adenopathy. There is  atherosclerotic calcification in the aorta. There is postoperative change in the lower cervical region. There is thoracolumbar levoscoliosis. IMPRESSION: No edema or consolidation.  Aortic atherosclerosis. Electronically Signed   By: Lowella Grip III M.D.   On: 04/09/2016 13:38   Ct Head Wo Contrast  Result Date: 04/09/2016 CLINICAL DATA:  Headache EXAM: CT HEAD WITHOUT CONTRAST TECHNIQUE: Contiguous axial images were obtained from the base of the skull through the vertex without intravenous contrast. COMPARISON:  Head CT July 13, 2005 and brain MRI December 07, 2015 FINDINGS: Brain: There is age related volume loss. There is a cavum septum pellucidum, an anatomic variant. There is no intracranial mass, hemorrhage, extra-axial fluid collection, or midline shift. There is mild patchy small vessel disease in the centra semiovale bilaterally. Elsewhere gray-white compartments appear normal. No acute infarct evident. Vascular: Per dense vessel. There is calcification in each carotid siphon region. Skull: Bony calvarium appears intact. Sinuses/Orbits: There is mild mucosal thickening in several ethmoid air cells bilaterally. Other paranasal sinuses are clear. Orbits appear symmetric bilaterally. Other: Mastoid air cells are clear. IMPRESSION: Age related volume loss with mild 3 periventricular small vessel disease. No intracranial mass, hemorrhage, or extra-axial fluid collection. No acute infarct evident. There are foci of arterial vascular calcification. There is mild ethmoid sinus disease bilaterally. Electronically Signed   By: Lowella Grip III M.D.   On: 04/09/2016 13:26   Mr Brain Wo Contrast  Result Date: 04/09/2016 CLINICAL DATA:  71 year old female with left facial numbness, shortness of breath. Initial encounter. EXAM: MRI HEAD WITHOUT CONTRAST TECHNIQUE: Multiplanar, multiecho pulse sequences of the brain and surrounding structures were obtained without intravenous contrast. COMPARISON:  Head  CT 1317 hours today.  Brain MRI 12/07/2015. FINDINGS: Brain: Stable cerebral volume. Cavum septum pellucidum (normal variant) again noted. No restricted diffusion to suggest acute infarction. No midline shift, mass effect, evidence of mass lesion, ventriculomegaly, extra-axial collection or acute intracranial hemorrhage. Cervicomedullary junction and pituitary are within normal limits. Stable gray and white matter signal throughout the brain. Mild or at most moderate for age scattered small foci of nonspecific T2 and FLAIR hyperintensity in both cerebral hemispheres. No cortical encephalomalacia. No chronic cerebral blood products. Deep gray matter nuclei, brainstem, and right cerebellar hemisphere remain normal for age. 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 Normal noncontrast appearance of the cavernous sinus. Vascular: Major intracranial vascular flow voids are stable and appear normal. Skull and upper cervical spine: Negative visualized cervical spine. Stable bone marrow signal, within normal limits. There are 2 small benign-appearing left frontal bone lesions which may be arachnoid granulations (series 10, image 25 and image 20). Sinuses/Orbits: Normal orbits soft tissues.  Trace paranasal sinus mucosal thickening. Visualized paranasal sinuses and mastoids are stable and well pneumatized. Other: Visible internal auditory structures appear normal. Negative scalp soft tissues. IMPRESSION: 1.  No acute intracranial abnormality. 2. Stable noncontrast MRI appearance of the brain since September 2017. Nonspecific signal changes compatible with mild to moderate for age chronic small vessel disease. Electronically Signed   By: Genevie Ann M.D.   On: 04/09/2016 19:05    ____________________________________________   PROCEDURES  Procedure(s) performed:   Procedures  None ____________________________________________   INITIAL IMPRESSION /  ASSESSMENT AND PLAN / ED COURSE  Pertinent labs & imaging results that were available during my care of the patient were reviewed by me and considered in my medical decision making (see chart for details).  Patient resents to the emergency department for evaluation of chest pain, dyspnea, HA, bilateral arm and leg numbness/weakness. Patient has no focal neurological deficits. She is breathing rapidly and endorses generalized weakness in arms and legs. She inconsistently cooperates with neurological exam. No clear indication for code stroke at this time. Patient also complaining of headache. Given her sudden onset symptoms my differential at this time is broad and includes ACS, PE, and vascular pathology. EKG reviewed with new RBBB. No hypoxemia or tachycardia. Plan for CT head with sudden onset HA. Will obtain cardiac biomarkers and reassess.   On re-evaluation the patient has had a resolution of CP and SOB. Reports that much of her strength has returned. She continues to complain of left face numbness. No appreciable weakness. Repeat exam is positive for isolated left face numbness. No sensory deficit in the left arm/leg. No pronator drift. Normal finger to nose. No clear visual field deficits. Differential is complex migraine vs acute CVA.   HA treated with Toradol and is now gone but numbness remains. No new deficits. Spoke with Dr. Leonel Ramsay who recommends MRI to assess for acute infarct with discharge home if negative. Spoke with Dr. Tyrone Nine at [facility] who accepts the patient in transfer. The patient is refusing to take an ambulance and will call her husband for a ride. I refused to let her drive herself with the reason for transfer being concern for acute stroke.  ____________________________________________  FINAL CLINICAL IMPRESSION(S) / ED DIAGNOSES  Final diagnoses:  Numbness and tingling of left side of face     MEDICATIONS GIVEN DURING THIS VISIT:  Medications  ketorolac  (TORADOL) 30 MG/ML injection 30 mg (30 mg Intravenous Given 04/09/16 1447)     NEW OUTPATIENT MEDICATIONS STARTED DURING THIS VISIT:  None   Note:  This document was prepared using Dragon voice recognition software and may include unintentional dictation errors.  Nanda Quinton, MD Emergency Medicine   Margette Fast, MD 04/10/16 [phone]

## 2016-04-09 NOTE — Progress Notes (Signed)
Pre visit review using our clinic review tool, if applicable. No additional management support is needed unless otherwise documented below in the visit note. 

## 2016-04-09 NOTE — ED Notes (Signed)
Pt transported for MRI.

## 2016-04-09 NOTE — ED Notes (Signed)
MD at bedside. 

## 2016-04-13 ENCOUNTER — Telehealth: Payer: Self-pay | Admitting: Family Medicine

## 2016-04-13 ENCOUNTER — Ambulatory Visit: Payer: Medicare Other | Admitting: Neurology

## 2016-04-13 ENCOUNTER — Other Ambulatory Visit: Payer: Self-pay | Admitting: Family Medicine

## 2016-04-13 DIAGNOSIS — J189 Pneumonia, unspecified organism: Secondary | ICD-10-CM

## 2016-04-13 DIAGNOSIS — R200 Anesthesia of skin: Secondary | ICD-10-CM

## 2016-04-13 DIAGNOSIS — R202 Paresthesia of skin: Principal | ICD-10-CM

## 2016-04-13 NOTE — Telephone Encounter (Signed)
Neurology referral entered. LB 

## 2016-04-13 NOTE — Telephone Encounter (Signed)
Caller name: Relationship to patient: Self Can be reached: [phone]  Pharmacy:  Reason for call:    FYI:  Patient called to inform Dr. Lorelei Pont that she is at home out of the hospital. Discharged on Sunday. Patient also needs an updated referral to [facility].

## 2016-04-16 ENCOUNTER — Encounter: Payer: Self-pay | Admitting: Family Medicine

## 2016-04-16 ENCOUNTER — Ambulatory Visit (INDEPENDENT_AMBULATORY_CARE_PROVIDER_SITE_OTHER): Payer: Medicare Other | Admitting: Family Medicine

## 2016-04-16 VITALS — BP 127/73 | HR 69 | Temp 98.4°F | Ht 63.0 in | Wt 140.8 lb

## 2016-04-16 DIAGNOSIS — R5383 Other fatigue: Secondary | ICD-10-CM | POA: Diagnosis not present

## 2016-04-16 DIAGNOSIS — R233 Spontaneous ecchymoses: Secondary | ICD-10-CM

## 2016-04-16 DIAGNOSIS — Z5181 Encounter for therapeutic drug level monitoring: Secondary | ICD-10-CM | POA: Diagnosis not present

## 2016-04-16 DIAGNOSIS — E876 Hypokalemia: Secondary | ICD-10-CM | POA: Diagnosis not present

## 2016-04-16 DIAGNOSIS — R238 Other skin changes: Secondary | ICD-10-CM

## 2016-04-16 LAB — VITAMIN D 25 HYDROXY (VIT D DEFICIENCY, FRACTURES): VITD: 32.8 ng/mL (ref 30.00–100.00)

## 2016-04-16 LAB — BASIC METABOLIC PANEL
BUN: 13 mg/dL (ref 6–23)
CALCIUM: 9.2 mg/dL (ref 8.4–10.5)
CHLORIDE: 101 meq/L (ref 96–112)
CO2: 32 mEq/L (ref 19–32)
CREATININE: 0.87 mg/dL (ref 0.40–1.20)
GFR: 68.15 mL/min (ref >60.00)
Glucose, Bld: 86 mg/dL (ref 70–99)
Potassium: 3.1 mEq/L — ABNORMAL LOW (ref 3.5–5.1)
Sodium: 140 mEq/L (ref 135–145)

## 2016-04-16 LAB — VITAMIN B12: VITAMIN B 12: 914 pg/mL — AB (ref 211–911)

## 2016-04-16 NOTE — Progress Notes (Signed)
Pre visit review using our clinic review tool, if applicable. No additional management support is needed unless otherwise documented below in the visit note. 

## 2016-04-16 NOTE — Progress Notes (Addendum)
[place] at [facility] [address] [place], [place] [address] [phone]  Date:  04/16/2016   Name:  Alexandria [name]   DOB:  06-27-1945   MRN:  [id number]  PCP:  [name], DO    Chief Complaint: Hospitalization Follow-up (Pt was seen in the ER on 04/09/16 for numbness and tingling. Here for hosp f/u. )   History of Present Illness:  Alexandria [name] is a 71 y.o. very pleasant female patient who presents with the following:  I saw this pt in the office last week on 1/4 as a follow up for CAP.  Following an albutrerol neb she had sudden onset of SOB and numbness.  She was transferred to the ER here and then eventually to [facility] to have an MRI. They did extensive evaluation and all was well including MRI of her brain and all was ok.  It seems that she had a panic attack precipitated by SOB  She is about to finish up her levaquin- she has a couple of days of cough syrup left Recent TSH was normal She has not noted any fever, she does have occasional chills She is still feeling tired. She would like to talk to a dietician to see if she can improve her energy level through diet. Also would like to check her B12 and D levels if possible She has never had any panic attacks in the past She is still using lasix BID- she will urinate out plenty of fluid. She has persistent edema of her left leg- clot has been ruled out and she is wearing compression- she has venous insufficiency of the leg and controls swelling with her lasix She is on potassium 40 daily as well Need to eval K level today   Patient Active Problem List   Diagnosis Date Noted  . Insomnia 11/18/2015  . Epistaxis 11/18/2015  . Normal coronary arteries 05/20/2015  . Chest pain at rest 05/20/2015  . RBBB 05/20/2015  . Abnormal CXR 05/20/2015  . History of MI (myocardial infarction) 05/08/2015  . Bruising 05/08/2015  . Upper airway cough syndrome 02/14/2015   . Asthmatic bronchitis with acute exacerbation 02/06/2015  . Decreased urination 12/28/2014  . Hypothyroidism 10/25/2014  . Fatigue 10/25/2014  . 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 Chronic venous insufficiency 10/24/2014  . Varicose veins of leg with complications 123XX123  . Degeneration of intervertebral disc of cervical region 06/29/2014  . Palpitations 11/08/2013  . SOB (shortness of breath) 10/03/2013  . DOE (dyspnea on exertion) 10/03/2013  . Edema of both legs 10/03/2013  . Hair loss 11/25/2012  . Falling hair 11/25/2012  . Diverticulitis 07/14/2012  . Hammer toe 06/13/2012  . Recurrent abdominal pain 04/17/2012  . Hypoglycemia 01/06/2012    Past Medical History:  Diagnosis Date  . Arthritis   . Asthmatic bronchitis with acute exacerbation 02/06/2015  . Colon polyp   . Diverticulitis   . DVT (deep venous thrombosis) ([place])    lower extremity  . H/O cardiac catheterization 2004   Normal coronary arteries  . Heart murmur   . History of stress test 05/21/2011  . Hx of echocardiogram 01/27/2010   Normal Ef 55% the transmitral spectral doppler flow pattern is normal for age. the left ventricular wall motion is normal  . Migraines   . Thyroid disease   . TIA (transient ischemic attack)    8 years ago    Past Surgical History:  Procedure Laterality Date  . BREAST BIOPSY     Isaiah Blakes  . CARDIAC CATHETERIZATION     10/2013  . LEFT HEART CATHETERIZATION WITH CORONARY ANGIOGRAM N/A 10/25/2013   Procedure: LEFT HEART CATHETERIZATION WITH CORONARY ANGIOGRAM;  Surgeon: Troy Sine, MD;  Location: [facility] CATH LAB;  Service: Cardiovascular;  Laterality: N/A;  . NECK SURGERY     x's 2  . TOTAL KNEE ARTHROPLASTY     Bilateral x's 2  . VAGINAL HYSTERECTOMY  10/17/1998   Dory Horn    Social History  Substance Use Topics  . Smoking status: Former Smoker    Years: 2.00    Quit date: 04/06/1969  . Smokeless tobacco: Never Used     Comment: socially smoked x 2 years  .  Alcohol use No    Family History  Problem Relation Age of Onset  . Arthritis    . Colon cancer Father   . Prostate cancer Father   . HIV Brother     32  . Kidney cancer Brother   . Colon cancer Brother   . Lung cancer Brother   . Other Brother     Mouth Cancer  . Ovarian cancer Mother   . Uterine cancer Mother   . Heart disease Maternal Grandmother   . Stroke Maternal Grandmother   . Hypertension Maternal Grandmother   . Diabetes Maternal Grandmother     Allergies  Allergen Reactions  . Prednisone Other (See Comments)    Homicidal  . Atrovent Hfa [Ipratropium Bromide Hfa] Other (See Comments)    Pt could not sleep  . Cheratussin Ac [Guaifenesin-Codeine]   . Hydrocodone Itching  . Influenza Vaccines Other (See Comments)    Pt reports heart attack after last flu shot  . Motrin [Ibuprofen] Other (See Comments)    "Gives false reading in blood"    Medication list has been reviewed and updated.  Current Outpatient Prescriptions on File Prior to Visit  Medication Sig Dispense Refill  . Ascorbic Acid (VITAMIN C PO) Take 1 Dose by mouth 4 (four) times daily.    . benzonatate (TESSALON) 100 MG capsule Take 1 capsule (100 mg total) by mouth 2 (two) times daily as needed for cough. 20 capsule 0  . Biotin (RA BIOTIN) 1000 MCG tablet Take 1,000 mcg by mouth 2 (two) times daily.     Marland Kitchen BREWERS YEAST PO Take 2 tablets by mouth 2 (two) times daily.     . busPIRone (BUSPAR) 15 MG tablet Take 1 tablet (15 mg total) by mouth 2 (two) times daily. 60 tablet 3  . clopidogrel (PLAVIX) 75 MG tablet TAKE 1 TABLET BY MOUTH EVERY DAY 90 tablet 1  . dicyclomine (BENTYL) 10 MG capsule TAKE 1-2 CAPSULES (10-20 MG TOTAL) BY MOUTH 4 (FOUR) TIMES DAILY - BEFORE MEALS AND AT BEDTIME. 360 capsule 0  . estradiol (ESTRACE) 2 MG tablet Take 2 mg by mouth daily.  11  . furosemide (LASIX) 40 MG tablet TAKE 1 TABLET (40 MG TOTAL) BY MOUTH 2 (TWO) TIMES DAILY. 180 tablet 1  . KLOR-CON M20 20 MEQ tablet TAKE  2 TABLETS (40 MEQ TOTAL) BY MOUTH DAILY. 180 tablet 1  . lactulose, encephalopathy, (CHRONULAC) 10 GM/15ML SOLN TAKE 30 MLS (20 G TOTAL) BY MOUTH EVERY 6 (SIX) HOURS AS NEEDED. 1892 mL 1  . levofloxacin (LEVAQUIN) 500 MG tablet TAKE 1 TABLET (500 MG TOTAL) BY MOUTH DAILY. 7 tablet 0  . Multiple Vitamin (MULTIVITAMIN) tablet Take 1 tablet by mouth  daily.    Marland Kitchen SYNTHROID 112 MCG tablet TAKE 1 TABLET BY MOUTH BEFORE BREAKFAST 90 tablet 1  . vitamin D, CHOLECALCIFEROL, 400 UNITS tablet Take 400 Units by mouth 2 (two) times daily.      No current facility-administered medications on file prior to visit.     Review of Systems:  As per HPI- otherwise negative.   Physical Examination: Blood pressure 127/73, pulse 69, temperature 98.4 F (36.9 C), temperature source Oral, height 5\' 3"  (1.6 m), weight 140 lb 12.8 oz (63.9 kg), SpO2 97 %.  Vitals:   04/16/16 1129  Weight: 140 lb 12.8 oz (63.9 kg)  Height: 5\' 3"  (1.6 m)   Body mass index is 24.94 kg/m. Ideal Body Weight: Weight in (lb) to have BMI = 25: 140.8  GEN: WDWN, NAD, Non-toxic, A & O x 3, looks well HEENT: Atraumatic, Normocephalic. Neck supple. No masses, No LAD.  Bilateral TM wnl, oropharynx normal.  PEERL,EOMI.   Ears and Nose: No external deformity. CV: RRR, No M/G/R. No JVD. No thrill. No extra heart sounds. PULM: CTA B, no wheezes, crackles, rhonchi. No retractions. No resp. distress. No accessory muscle use. ABD: S, NT, ND, +BS. No rebound. No HSM. EXTR: No c/c.  Mild edema of left ankle, she is wearing compression hose NEURO Normal gait.  PSYCH: Normally interactive. Conversant. Not depressed or anxious appearing.  Calm demeanor.    Assessment and Plan: Other fatigue - Plan: Vitamin D (25 hydroxy), B12, Amb ref to Medical Nutrition Therapy-MNT  Medication monitoring encounter - Plan: Vitamin D (25 hydroxy), 123456, Basic metabolic panel  Hypokalemia - Plan: Basic metabolic panel  Easy bruising - Plan: Vitamin D (25  hydroxy), B12  Here today to follow-up from recent ER visit.  She had acute SOB likely due to panic attack from albuterol neb and pneumonia. Her evaluation was benign at the ER Today she is feeling much better, but notes that she still feels tired  She would like a dietician referral which is fine Will also check her D and B12 levels for her, and her potassium as she is on diuretic therapy   Will plan further follow- up pending labs.   Signed Lamar Blinks, MD  Received her labs 1/12- as above She is still using lasix BID- she will urinate out plenty of fluid. She has persistent edema of her left leg- clot has been ruled out and she is wearing compression- she has venous insufficiency of the leg and controls swelling with her lasix She is on potassium 40 daily as well  Gave her a call- she continues to improve from her recent illness.  She will continue to take her OTC vitamin d and is pleased that her B12 is normal Her K is low- she is taking a good amount of lasix to manage uncomfortable swelling in her left leg.  We also tried sprio but she did not tolerate it.  She will try decreasing her lasix some (may alternate 40 and 80 every other day) and will increase her K by 10 meq.  She will come in for a K level in 7- 10 days    Results for orders placed or performed in visit on 04/16/16  Vitamin D (25 hydroxy)  Result Value Ref Range   VITD 32.80 30.00 - 100.00 ng/mL  B12  Result Value Ref Range   Vitamin B-12 914 (H) 211 - 911 pg/mL  Basic metabolic panel  Result Value Ref Range   Sodium 140 135 -  145 mEq/L   Potassium 3.1 (L) 3.5 - 5.1 mEq/L   Chloride 101 96 - 112 mEq/L   CO2 32 19 - 32 mEq/L   Glucose, Bld 86 70 - 99 mg/dL   BUN 13 6 - 23 mg/dL   Creatinine, Ser 0.87 0.40 - 1.20 mg/dL   Calcium 9.2 8.4 - 10.5 mg/dL   GFR 68.15 >60.00 mL/min

## 2016-04-16 NOTE — Patient Instructions (Addendum)
It was good to see you today- I am glad that you are feeling better!   We will check your vitamin B12 and vitamin D levels to see if we can find any reason for your fatigue. I will also check your BMP today to check on your potassium level and sodium level today I hope that you are soon feeling better but let me know if you are not doing ok

## 2016-04-17 NOTE — Addendum Note (Signed)
Addended by: Lamar Blinks C on: 04/17/2016 05:38 PM   Modules accepted: Orders

## 2016-04-22 ENCOUNTER — Ambulatory Visit: Payer: Medicare Other | Admitting: Neurology

## 2016-04-24 ENCOUNTER — Telehealth: Payer: Self-pay | Admitting: *Deleted

## 2016-04-24 NOTE — Telephone Encounter (Signed)
Checked on status, pt was r/s to 06/09/16 at 930am with AA,MD

## 2016-04-24 NOTE — Telephone Encounter (Signed)
LVM for pt to call back to r/s appt from 04/12/17. Gave [facility] phone number. Can offer 04/28/16 at 430pm, check in 400pm.

## 2016-04-28 ENCOUNTER — Telehealth: Payer: Self-pay | Admitting: Family Medicine

## 2016-04-28 NOTE — Telephone Encounter (Signed)
Patient scheduled medicare wellness appointment for 04/30/16 at 1pm with [facility] and follow up with PCP at 1:45pm.

## 2016-04-29 ENCOUNTER — Telehealth: Payer: Self-pay | Admitting: Family Medicine

## 2016-04-29 NOTE — Progress Notes (Signed)
   Subjective:   Alexandria [name] is a 71 y.o. female who presents for an Initial Medicare Annual Wellness Visit.

## 2016-04-29 NOTE — Telephone Encounter (Signed)
Patient declined medicare wellness visit.

## 2016-04-29 NOTE — Progress Notes (Signed)
Pre visit review using our clinic review tool, if applicable. No additional management support is needed unless otherwise documented below in the visit note. 

## 2016-04-30 ENCOUNTER — Ambulatory Visit (INDEPENDENT_AMBULATORY_CARE_PROVIDER_SITE_OTHER): Payer: Medicare Other | Admitting: Family Medicine

## 2016-04-30 VITALS — BP 142/81 | HR 73 | Temp 98.6°F | Ht 63.0 in | Wt 137.0 lb

## 2016-04-30 DIAGNOSIS — E876 Hypokalemia: Secondary | ICD-10-CM

## 2016-04-30 DIAGNOSIS — R6881 Early satiety: Secondary | ICD-10-CM

## 2016-04-30 DIAGNOSIS — R1012 Left upper quadrant pain: Secondary | ICD-10-CM

## 2016-04-30 LAB — BASIC METABOLIC PANEL
BUN: 12 mg/dL (ref 6–23)
CHLORIDE: 100 meq/L (ref 96–112)
CO2: 32 mEq/L (ref 19–32)
CREATININE: 0.86 mg/dL (ref 0.40–1.20)
Calcium: 9.3 mg/dL (ref 8.4–10.5)
GFR: 69.06 mL/min (ref >60.00)
Glucose, Bld: 97 mg/dL (ref 70–99)
POTASSIUM: 3.2 meq/L — AB (ref 3.5–5.1)
Sodium: 140 mEq/L (ref 135–145)

## 2016-04-30 NOTE — Progress Notes (Addendum)
[place] at [facility] [address], [place], [place] [address] [phone]  Date:  04/30/2016   Name:  Alexandria [name]   DOB:  1946-01-10   MRN:  [id number]  PCP:  [name], DO    Chief Complaint: Chest Pain (c/o pain right under left rib cage area. Pt states sx's have been present for a while. )   History of Present Illness:  Alexandria [name] is a 71 y.o. very pleasant female patient who presents with the following:  History of MI, TIA.   He has noted some pain in her left upper abdomen- under her left ribs- for approx 2 years, off an on.  Notes that when her cat walks on the area while she is asleep it hurts a lot and she will wake up. The pain is always located in the same spot  She got concerned that this could be an issue with her spleen and decided to be seen She notes that she will the discomfort after eating sometimes. She also notes that she feels like she gets full very easily over recent months Her weight has been fairly stable however  Wt Readings from Last 3 Encounters:  04/30/16 137 lb (62.1 kg)  04/16/16 140 lb 12.8 oz (63.9 kg)  04/09/16 139 lb (63 kg)   08/2015- 136 lbs No vomiting or diarrhea No SOB or CP Notes a poor appetite in general- however this is not new.  Did not eat anything yet today because she just did not feel hungry This discomfort is in her abd, not in her chest  She is a bit concerned today because her husband left this am to go to the [facility] and she has not heard from him. He does not have a cell phone.  She hopes that he is ok- wonders if this stress is why her BP was a bit up today  She had mild hypokalemia at our last labs.  She would like to check this again today   Chemistry      Component Value Date/Time   NA 140 04/16/2016 1211   K 3.1 (L) 04/16/2016 1211   CL 101 04/16/2016 1211   CO2 32 04/16/2016 1211   BUN 13 04/16/2016 1211   CREATININE 0.87  04/16/2016 1211   CREATININE 0.74 06/11/2015 1002   GLU 106 12/13/2015      Component Value Date/Time   CALCIUM 9.2 04/16/2016 1211   ALKPHOS 68 04/09/2016 1304   AST 35 04/09/2016 1304   ALT 23 04/09/2016 1304   BILITOT 0.4 04/09/2016 1304     BP Readings from Last 3 Encounters:  04/30/16 (!) 142/81  04/16/16 127/73  04/09/16 120/66    Patient Active Problem List   Diagnosis Date Noted  . Insomnia 11/18/2015  . Epistaxis 11/18/2015  . Normal coronary arteries 05/20/2015  . Chest pain at rest 05/20/2015  . RBBB 05/20/2015  . Abnormal CXR 05/20/2015  . History of MI (myocardial infarction) 05/08/2015  . Bruising 05/08/2015  . Upper airway cough syndrome 02/14/2015  . Asthmatic bronchitis with acute exacerbation 02/06/2015  . Decreased urination 12/28/2014  . Hypothyroidism 10/25/2014  . Fatigue 10/25/2014  . 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 Chronic venous insufficiency 10/24/2014  . Varicose veins of leg with complications [id number]  . Degeneration of intervertebral disc of cervical region 06/29/2014  . Palpitations 11/08/2013  . SOB (shortness of breath) 10/03/2013  . DOE (dyspnea  on exertion) 10/03/2013  . Edema of both legs 10/03/2013  . Hair loss 11/25/2012  . Falling hair 11/25/2012  . Diverticulitis 07/14/2012  . Hammer toe 06/13/2012  . Recurrent abdominal pain 04/17/2012  . Hypoglycemia 01/06/2012    Past Medical History:  Diagnosis Date  . Arthritis   . Asthmatic bronchitis with acute exacerbation 02/06/2015  . Colon polyp   . Diverticulitis   . DVT (deep venous thrombosis) ([place])    lower extremity  . H/O cardiac catheterization 2004   Normal coronary arteries  . Heart murmur   . History of stress test 05/21/2011  . Hx of echocardiogram 01/27/2010   Normal Ef 55% the transmitral spectral doppler flow pattern is normal for age. the left ventricular wall motion is normal  . Migraines   . Thyroid disease   . TIA (transient ischemic attack)     8 years ago    Past Surgical History:  Procedure Laterality Date  . BREAST BIOPSY     Isaiah Blakes  . CARDIAC CATHETERIZATION     10/2013  . LEFT HEART CATHETERIZATION WITH CORONARY ANGIOGRAM N/A 10/25/2013   Procedure: LEFT HEART CATHETERIZATION WITH CORONARY ANGIOGRAM;  Surgeon: Troy Sine, MD;  Location: [facility] CATH LAB;  Service: Cardiovascular;  Laterality: N/A;  . NECK SURGERY     x's 2  . TOTAL KNEE ARTHROPLASTY     Bilateral x's 2  . VAGINAL HYSTERECTOMY  10/17/1998   Dory Horn    Social History  Substance Use Topics  . Smoking status: Former Smoker    Years: 2.00    Quit date: 04/06/1969  . Smokeless tobacco: Never Used     Comment: socially smoked x 2 years  . Alcohol use No    Family History  Problem Relation Age of Onset  . Arthritis    . Colon cancer Father   . Prostate cancer Father   . HIV Brother     87  . Kidney cancer Brother   . Colon cancer Brother   . Lung cancer Brother   . Other Brother     Mouth Cancer  . Ovarian cancer Mother   . Uterine cancer Mother   . Heart disease Maternal Grandmother   . Stroke Maternal Grandmother   . Hypertension Maternal Grandmother   . Diabetes Maternal Grandmother     Allergies  Allergen Reactions  . Prednisone Other (See Comments)    Homicidal  . Atrovent Hfa [Ipratropium Bromide Hfa] Other (See Comments)    Pt could not sleep  . Cheratussin Ac [Guaifenesin-Codeine]   . Hydrocodone Itching  . Influenza Vaccines Other (See Comments)    Pt reports heart attack after last flu shot  . Motrin [Ibuprofen] Other (See Comments)    "Gives false reading in blood"    Medication list has been reviewed and updated.  Current Outpatient Prescriptions on File Prior to Visit  Medication Sig Dispense Refill  . Ascorbic Acid (VITAMIN C PO) Take 1 Dose by mouth 4 (four) times daily.    . benzonatate (TESSALON) 100 MG capsule Take 1 capsule (100 mg total) by mouth 2 (two) times daily as needed for cough. 20 capsule 0  .  Biotin (RA BIOTIN) 1000 MCG tablet Take 1,000 mcg by mouth 2 (two) times daily.     Marland Kitchen BREWERS YEAST PO Take 2 tablets by mouth 2 (two) times daily.     . busPIRone (BUSPAR) 15 MG tablet Take 1 tablet (15 mg total) by mouth 2 (two) times  daily. 60 tablet 3  . clopidogrel (PLAVIX) 75 MG tablet TAKE 1 TABLET BY MOUTH EVERY DAY 90 tablet 1  . dicyclomine (BENTYL) 10 MG capsule TAKE 1-2 CAPSULES (10-20 MG TOTAL) BY MOUTH 4 (FOUR) TIMES DAILY - BEFORE MEALS AND AT BEDTIME. 360 capsule 0  . dicyclomine (BENTYL) 20 MG tablet Take 1 tablet by mouth daily.    Marland Kitchen estradiol (ESTRACE) 2 MG tablet Take 2 mg by mouth daily.  11  . furosemide (LASIX) 40 MG tablet TAKE 1 TABLET (40 MG TOTAL) BY MOUTH 2 (TWO) TIMES DAILY. 180 tablet 1  . KLOR-CON M20 20 MEQ tablet TAKE 2 TABLETS (40 MEQ TOTAL) BY MOUTH DAILY. 180 tablet 1  . lactulose, encephalopathy, (CHRONULAC) 10 GM/15ML SOLN TAKE 30 MLS (20 G TOTAL) BY MOUTH EVERY 6 (SIX) HOURS AS NEEDED. 1892 mL 1  . levofloxacin (LEVAQUIN) 500 MG tablet TAKE 1 TABLET (500 MG TOTAL) BY MOUTH DAILY. 7 tablet 0  . Multiple Vitamin (MULTIVITAMIN) tablet Take 1 tablet by mouth daily.    Marland Kitchen SYNTHROID 112 MCG tablet TAKE 1 TABLET BY MOUTH BEFORE BREAKFAST 90 tablet 1  . vitamin D, CHOLECALCIFEROL, 400 UNITS tablet Take 400 Units by mouth 2 (two) times daily.      No current facility-administered medications on file prior to visit.     Review of Systems:  As per HPI- otherwise negative.   Physical Examination: Blood pressure (!) 142/81, pulse 73, temperature 98.6 F (37 C), temperature source Oral, height 5\' 3"  (1.6 m), weight 137 lb (62.1 kg), SpO2 100 %.  Vitals:   04/30/16 1337  Height: 5\' 3"  (1.6 m)   There is no height or weight on file to calculate BMI. Ideal Body Weight: Weight in (lb) to have BMI = 25: 140.8  GEN: WDWN, NAD, Non-toxic, A & O x 3, looks well and her normal self, slim build HEENT: Atraumatic, Normocephalic. Neck supple. No masses, No LAD.  Bilateral TM wnl, oropharynx normal.  PEERL,EOMI.   Ears and Nose: No external deformity. CV: RRR, No M/G/R. No JVD. No thrill. No extra heart sounds. PULM: CTA B, no wheezes, crackles, rhonchi. No retractions. No resp. distress. No accessory muscle use. ABD: S, ND, +BS. No rebound. No HSM. I do not appreciate any masses.  Under the left rib border she notes a tender area which is the same spot she has felt her pain. Not bony rib pain- the area of concern is in the abdomen EXTR: No c/c/e NEURO Normal gait.  PSYCH: Normally interactive. Conversant. Not depressed or anxious appearing.  Calm demeanor.    Assessment and Plan: Abdominal pain, left upper quadrant - Plan: US Abdomen Limited  Hypokalemia - Plan: Basic metabolic panel  Here today with a tender area in her LUQ for a couple of years- will arrange an [place] for her to evauate further Recent CMP was normal except for low K- will repeat her K today.  She is on lasix for edema She will alert me if any change or worsening of her sx Colonoscopy 2014  Signed Lamar Blinks, MD  Gave her a call with labs and [place] results on 05/04/16.  Her [place] looks ok and her BMP is good (for her- she has low K due to chronic diuretic use). She continues to have intermittent LUQ pain- would like to look further as it continues to bother her.  Will arrange for a CT for her  Results for orders placed or performed in visit on 04/30/16  Basic metabolic panel  Result Value Ref Range   Sodium 140 135 - 145 mEq/L   Potassium 3.2 (L) 3.5 - 5.1 mEq/L   Chloride 100 96 - 112 mEq/L   CO2 32 19 - 32 mEq/L   Glucose, Bld 97 70 - 99 mg/dL   BUN 12 6 - 23 mg/dL   Creatinine, Ser 0.86 0.40 - 1.20 mg/dL   Calcium 9.3 8.4 - 10.5 mg/dL   GFR 69.06 >60.00 mL/min   Dg Chest 2 View  Result Date: 04/09/2016 CLINICAL DATA:  Shortness of breath with cough and chest pain. Fever. EXAM: CHEST  2 VIEW COMPARISON:  April 07, 2016 FINDINGS: There is no edema or consolidation. Heart  size and pulmonary vascularity are normal. No adenopathy. There is atherosclerotic calcification in the aorta. There is postoperative change in the lower cervical region. There is thoracolumbar levoscoliosis. IMPRESSION: No edema or consolidation.  Aortic atherosclerosis. Electronically Signed   By: Lowella Grip III M.D.   On: 04/09/2016 13:38   Dg Chest 2 View  Result Date: 04/07/2016 CLINICAL DATA:  Patient c/o cough since 04/03/16, some chest pains when coughing, rattling in LLL, non-smoker, no other complaints EXAM: CHEST  2 VIEW COMPARISON:  02/26/2016 FINDINGS: Cardiac silhouette is normal in size. No mediastinal hilar masses. No evidence of adenopathy. There are prominent bronchovascular markings with mild lung base interstitial thickening, stable. Lungs otherwise clear. No pleural effusion. No pneumothorax. Skeletal structures are demineralized but grossly intact. IMPRESSION: No acute cardiopulmonary disease. Electronically Signed   By: Lajean Manes M.D.   On: 04/07/2016 14:15   Ct Head Wo Contrast  Result Date: 04/09/2016 CLINICAL DATA:  Headache EXAM: CT HEAD WITHOUT CONTRAST TECHNIQUE: Contiguous axial images were obtained from the base of the skull through the vertex without intravenous contrast. COMPARISON:  Head CT July 13, 2005 and brain MRI December 07, 2015 FINDINGS: Brain: There is age related volume loss. There is a cavum septum pellucidum, an anatomic variant. There is no intracranial mass, hemorrhage, extra-axial fluid collection, or midline shift. There is mild patchy small vessel disease in the centra semiovale bilaterally. Elsewhere gray-white compartments appear normal. No acute infarct evident. Vascular: Per dense vessel. There is calcification in each carotid siphon region. Skull: Bony calvarium appears intact. Sinuses/Orbits: There is mild mucosal thickening in several ethmoid air cells bilaterally. Other paranasal sinuses are clear. Orbits appear symmetric bilaterally. Other:  Mastoid air cells are clear. IMPRESSION: Age related volume loss with mild 3 periventricular small vessel disease. No intracranial mass, hemorrhage, or extra-axial fluid collection. No acute infarct evident. There are foci of arterial vascular calcification. There is mild ethmoid sinus disease bilaterally. Electronically Signed   By: Lowella Grip III M.D.   On: 04/09/2016 13:26   Mr Brain Wo Contrast  Result Date: 04/09/2016 CLINICAL DATA:  71 year old female with left facial numbness, shortness of breath. Initial encounter. EXAM: MRI HEAD WITHOUT CONTRAST TECHNIQUE: Multiplanar, multiecho pulse sequences of the brain and surrounding structures were obtained without intravenous contrast. COMPARISON:  Head CT 1317 hours today.  Brain MRI 12/07/2015. FINDINGS: Brain: Stable cerebral volume. Cavum septum pellucidum (normal variant) again noted. No restricted diffusion to suggest acute infarction. No midline shift, mass effect, evidence of mass lesion, ventriculomegaly, extra-axial collection or acute intracranial hemorrhage. Cervicomedullary junction and pituitary are within normal limits. Stable gray and white matter signal throughout the brain. Mild or at most moderate for age scattered small foci of nonspecific T2 and FLAIR hyperintensity in both cerebral hemispheres. No cortical  encephalomalacia. No chronic cerebral blood products. Deep gray matter nuclei, brainstem, and right cerebellar hemisphere remain normal for age. 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 Normal noncontrast appearance of the cavernous sinus. Vascular: Major intracranial vascular flow voids are stable and appear normal. Skull and upper cervical spine: Negative visualized cervical spine. Stable bone marrow signal, within normal limits. There are 2 small benign-appearing left frontal bone lesions which may be arachnoid granulations (series 10, image 25 and image 20).  Sinuses/Orbits: Normal orbits soft tissues. Trace paranasal sinus mucosal thickening. Visualized paranasal sinuses and mastoids are stable and well pneumatized. Other: Visible internal auditory structures appear normal. Negative scalp soft tissues. IMPRESSION: 1.  No acute intracranial abnormality. 2. Stable noncontrast MRI appearance of the brain since September 2017. Nonspecific signal changes compatible with mild to moderate for age chronic small vessel disease. Electronically Signed   By: Genevie Ann M.D.   On: 04/09/2016 19:05   US Abdomen Complete  Result Date: 05/01/2016 CLINICAL DATA:  Chronic left upper quadrant pain, early satiety, and nausea. EXAM: ABDOMEN ULTRASOUND COMPLETE COMPARISON:  07/18/2014 FINDINGS: Gallbladder: No gallstones or wall thickening visualized. No sonographic Murphy sign noted by sonographer. Common bile duct: Diameter: 4 mm, within normal limits. Liver: No focal lesion identified. Within normal limits in parenchymal echogenicity. IVC: No abnormality visualized. Pancreas: Visualized portion unremarkable. Spleen: Size and appearance within normal limits. Right Kidney: Length: 10.3 cm. Echogenicity within normal limits. No mass or hydronephrosis visualized. Left Kidney: Length: 10.1 cm. Echogenicity within normal limits. No mass or hydronephrosis visualized. Abdominal aorta: No aneurysm visualized. Atherosclerotic plaque noted. Other findings: None. IMPRESSION: No evidence of cholelithiasis, hydronephrosis, or other acute findings. Abdominal aortic atherosclerosis, without evidence of aneurysm. Electronically Signed   By: Earle Gell M.D.   On: 05/01/2016 17:05

## 2016-04-30 NOTE — Patient Instructions (Signed)
It was a pleasure to see you today- we will get you an ultrasound to evaluate your tender area for you Will also repeat your potassium today  Take care and let me know if you have any change or worsening of your sympotms

## 2016-05-01 ENCOUNTER — Ambulatory Visit (HOSPITAL_BASED_OUTPATIENT_CLINIC_OR_DEPARTMENT_OTHER)
Admission: RE | Admit: 2016-05-01 | Discharge: 2016-05-01 | Disposition: A | Discharge status: Home / Self Care | Payer: Medicare Other | Source: Ambulatory Visit | Attending: Family Medicine | Admitting: Family Medicine

## 2016-05-01 ENCOUNTER — Inpatient Hospital Stay (HOSPITAL_BASED_OUTPATIENT_CLINIC_OR_DEPARTMENT_OTHER): Admission: RE | Admit: 2016-05-01 | Payer: Medicare Other | Source: Ambulatory Visit

## 2016-05-01 DIAGNOSIS — I700 Atherosclerosis of aorta: Secondary | ICD-10-CM | POA: Insufficient documentation

## 2016-05-01 DIAGNOSIS — R1012 Left upper quadrant pain: Secondary | ICD-10-CM | POA: Diagnosis not present

## 2016-05-04 ENCOUNTER — Encounter: Payer: Self-pay | Admitting: Family Medicine

## 2016-05-04 NOTE — Addendum Note (Signed)
Addended by: Lamar Blinks C on: 05/04/2016 05:00 PM   Modules accepted: Orders

## 2016-05-08 ENCOUNTER — Ambulatory Visit (HOSPITAL_BASED_OUTPATIENT_CLINIC_OR_DEPARTMENT_OTHER)
Admission: RE | Admit: 2016-05-08 | Discharge: 2016-05-08 | Disposition: A | Discharge status: Home / Self Care | Payer: Medicare Other | Source: Ambulatory Visit | Attending: Family Medicine | Admitting: Family Medicine

## 2016-05-08 ENCOUNTER — Other Ambulatory Visit: Payer: Self-pay | Admitting: Internal Medicine

## 2016-05-08 DIAGNOSIS — R1012 Left upper quadrant pain: Secondary | ICD-10-CM | POA: Diagnosis not present

## 2016-05-08 DIAGNOSIS — R6881 Early satiety: Secondary | ICD-10-CM | POA: Insufficient documentation

## 2016-05-08 DIAGNOSIS — I700 Atherosclerosis of aorta: Secondary | ICD-10-CM | POA: Diagnosis not present

## 2016-05-08 DIAGNOSIS — R1013 Epigastric pain: Secondary | ICD-10-CM | POA: Diagnosis not present

## 2016-05-08 DIAGNOSIS — K551 Chronic vascular disorders of intestine: Secondary | ICD-10-CM | POA: Insufficient documentation

## 2016-05-08 MED ORDER — IOPAMIDOL (ISOVUE-300) INJECTION 61%
100.0000 mL | Freq: Once | INTRAVENOUS | Status: AC | PRN
  Administered 2016-05-08: 100 mL via INTRAVENOUS

## 2016-05-10 ENCOUNTER — Telehealth: Payer: Self-pay | Admitting: Family Medicine

## 2016-05-10 DIAGNOSIS — R109 Unspecified abdominal pain: Principal | ICD-10-CM

## 2016-05-10 DIAGNOSIS — G8929 Other chronic pain: Secondary | ICD-10-CM

## 2016-05-10 NOTE — Telephone Encounter (Signed)
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  Will ask vascular surgery if they would like to see pt or would GI be more appropriate.  Will get back to her with answer

## 2016-05-13 ENCOUNTER — Telehealth: Payer: Self-pay | Admitting: Family Medicine

## 2016-05-13 DIAGNOSIS — R1012 Left upper quadrant pain: Secondary | ICD-10-CM

## 2016-05-13 NOTE — Telephone Encounter (Signed)
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  I doubt her pain is related to this.  Inquire if she has post-prandial pain and weight loss.  She would need a full work up from GI ( it would need to be neagtive) before considering further evaluation and treatment of these lesions ----- Message ----- From: Darreld Mclean, MD Sent: 05/10/2016  12:58 PM To: Serafina Mitchell, MD  Hi Wells- I have a somewhat unusual CT finding as was not sure if this was something that your office could look at for me.  Pt has noted non -specific LUQ pain for a couple of years, some early satiety, getting worse.  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  This may not be significant but I thought it was worth following up. Would you guys want to see this pt or would ?GI be more appropriate  Thanks so much and hope that you guys are well Jess

## 2016-05-13 NOTE — Telephone Encounter (Signed)
Heard back from Dr. 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 I doubt her pain is related to this. Inquire if she has post-prandial pain and weight loss. She would need a full work up from GI ( it would need to be neagtive) before considering further evaluation and treatment of these lesions    Called pt and spoke with her.  She does not have post- prandial pain and no weight loss. She is willing to see GI and I will arrange this for her

## 2016-05-15 ENCOUNTER — Telehealth: Payer: Self-pay | Admitting: Family Medicine

## 2016-05-15 NOTE — Telephone Encounter (Signed)
Caller name: Jovannah Relation to pt: self  Call back number: [phone] Pharmacy:  Reason for call: Pt came in office stating is needing a copy of her Cat Scan that was done here at the [facility] last Friday and pt is also needing a copy of her labs with lip panel results printed out (her  since pt has an appt on Monday at 11:00 with a specialist and they are requesting for her to bring in these copies for her visit. Pt states will pick up copies on Monday if possible to have it ready by then. Please advise.

## 2016-05-18 NOTE — Telephone Encounter (Signed)
Patient called and items printed and put at front desk for pickup.

## 2016-05-20 ENCOUNTER — Telehealth: Payer: Self-pay | Admitting: Family Medicine

## 2016-05-20 NOTE — Telephone Encounter (Signed)
Patient declined AWV and will call back when ready to schedule

## 2016-05-21 ENCOUNTER — Telehealth: Payer: Self-pay | Admitting: Cardiovascular Disease

## 2016-05-21 NOTE — Telephone Encounter (Signed)
New message     Pt is calling asking that Mariann Laster call her. Asked pt if there was a certain message, she said no.

## 2016-05-21 NOTE — Telephone Encounter (Signed)
Spoke with pt, aware wanda is in clinic, is there anything I could help her with. She called to let us know her husband was a former patient of dr Claiborne Billings and has not seen him by her report in 3-4 years.(there is nothing in his current EPIC chart.) she called to schedule an appointment to follow up with dr Claiborne Billings because they recently found out from the [place] he has an enlarged heart. She was told by the person that answered the phone they could not be seen here without a referral. She explained to the person on the phone that trying to get a referral from the [place] is impossible. They again told her they needed a referral to schedule appt. She has an appt with a medical doctor today and will get a referral to see cardiology but it may not be with dr Claiborne Billings. Patient wanted to let us know because she feels the process may need to be looked at to change. Voiced understanding and I will forward this information to wanda and the supervisor of the folks that answer the phone.

## 2016-05-26 ENCOUNTER — Ambulatory Visit: Payer: Medicare Other | Admitting: Registered"

## 2016-05-29 NOTE — Telephone Encounter (Signed)
Please give her a call back- she is going to see GI first ( I think her appt was today) and only be referred to vascular if GI did not turn up any cause for her abdominal pain

## 2016-05-29 NOTE — Telephone Encounter (Signed)
Patient informed of message below and stated she would rather not go back to see Mauri Pole, MD gastro due to the initial appointment, specialist suggested surgery due to patient diverticulitis being severe. Patient states she doesn't want to undergo surgery and would  prefer to be referred to Dr. Anson Fret who conducted her colonoscopy, please advise

## 2016-05-29 NOTE — Telephone Encounter (Signed)
Patient checking on status of referral to vascular surgery best # [phone]

## 2016-06-01 NOTE — Addendum Note (Signed)
Addended by: Lamar Blinks C on: 06/01/2016 05:04 PM   Modules accepted: Orders

## 2016-06-03 ENCOUNTER — Ambulatory Visit (INDEPENDENT_AMBULATORY_CARE_PROVIDER_SITE_OTHER): Payer: Medicare Other | Admitting: Family Medicine

## 2016-06-03 VITALS — BP 106/72 | HR 71 | Temp 98.8°F | Wt 140.4 lb

## 2016-06-03 DIAGNOSIS — Z1159 Encounter for screening for other viral diseases: Secondary | ICD-10-CM

## 2016-06-03 DIAGNOSIS — E559 Vitamin D deficiency, unspecified: Secondary | ICD-10-CM

## 2016-06-03 DIAGNOSIS — Z8639 Personal history of other endocrine, nutritional and metabolic disease: Secondary | ICD-10-CM

## 2016-06-03 DIAGNOSIS — G8929 Other chronic pain: Secondary | ICD-10-CM | POA: Diagnosis not present

## 2016-06-03 DIAGNOSIS — E876 Hypokalemia: Secondary | ICD-10-CM | POA: Diagnosis not present

## 2016-06-03 DIAGNOSIS — M8580 Other specified disorders of bone density and structure, unspecified site: Secondary | ICD-10-CM

## 2016-06-03 DIAGNOSIS — R600 Localized edema: Secondary | ICD-10-CM | POA: Diagnosis not present

## 2016-06-03 DIAGNOSIS — R109 Unspecified abdominal pain: Secondary | ICD-10-CM | POA: Diagnosis not present

## 2016-06-03 LAB — BASIC METABOLIC PANEL
BUN: 12 mg/dL (ref 6–23)
CO2: 31 mEq/L (ref 19–32)
Calcium: 9.2 mg/dL (ref 8.4–10.5)
Chloride: 101 mEq/L (ref 96–112)
Creatinine, Ser: 0.79 mg/dL (ref 0.40–1.20)
GFR: 76.15 mL/min (ref >60.00)
GLUCOSE: 87 mg/dL (ref 70–99)
Potassium: 3.5 mEq/L (ref 3.5–5.1)
Sodium: 138 mEq/L (ref 135–145)

## 2016-06-03 LAB — VITAMIN D 25 HYDROXY (VIT D DEFICIENCY, FRACTURES): VITD: 34.34 ng/mL (ref 30.00–100.00)

## 2016-06-03 LAB — HEPATITIS C ANTIBODY: HCV Ab: NEGATIVE

## 2016-06-03 NOTE — Progress Notes (Signed)
[place] at [facility] [address], [place], [place] [address] [phone]  Date:  06/03/2016   Name:  Alexandria [name]   DOB:  03-08-1946   MRN:  [id number]  PCP:  [name], DO    Chief Complaint: Referral (Pt would like a vascular referral. )   History of Present Illness:  Alexandria [name] is a 71 y.o. very pleasant female patient who presents with the following:  We have been trying to evaluate persistent abdominal pain for her- this led to a CT scan earlier this month as follows:  IMPRESSION: Aortic atherosclerosis.  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  No other significant abnormality seen in the abdomen or pelvis.  I had touched base with Dr. Trula Slade from vascular surgery who felt that her vascular system was LESS likely to be the source of her pain and recommended a full GI work- up prior to seeing vascular surgery.    She was seen by one GI doctor with [facility] but has requested a 2nd opinion she is seeing another GI doctor this month (I cannot see the appt date in Epic however)  She is seeing a nutrionist- she suggested that she eat a lot of beans, but she has a hard time tolerating this.  However she still does not feel like her energy level is all that good  She would like to have a vitamin D and K check today- her nutritionist was concerned about her vitamin D and possible deficiency  She continues to have some swelling in her calves- this is chronic and very annoying for her She does wear support stockings She uses lasix 40 BID She did have a DVT when she was in her teens- never since until she had her knee surgery.  Regular exercise does help with her swelling If she stands a lot- like with her singing group- she will have more leg pain She will be competing with her singing group next month; "its like a female  barbershop quartet."   Patient Active Problem List   Diagnosis Date Noted  . Insomnia 11/18/2015  . Chest pain at rest 05/20/2015  . RBBB 05/20/2015  . Abnormal CXR 05/20/2015  . History of MI (myocardial infarction) 05/08/2015  . Bruising 05/08/2015  . Upper airway cough syndrome 02/14/2015  . Asthmatic bronchitis with acute exacerbation 02/06/2015  . Hypothyroidism 10/25/2014  . Fatigue 10/25/2014  . 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 Chronic venous insufficiency 10/24/2014  . Varicose veins of leg with complications [id number]  . Degeneration of intervertebral disc of cervical region 06/29/2014  . Palpitations 11/08/2013  . SOB (shortness of breath) 10/03/2013  . DOE (dyspnea on exertion) 10/03/2013  . Edema of both legs 10/03/2013  . Hair loss 11/25/2012  . Diverticulitis 07/14/2012  . Hammer toe 06/13/2012  . Recurrent abdominal pain 04/17/2012  . Hypoglycemia 01/06/2012    Past Medical History:  Diagnosis Date  . Arthritis   . Asthmatic bronchitis with acute exacerbation 02/06/2015  . Colon polyp   . Diverticulitis   . DVT (deep venous thrombosis) ([place])    lower extremity  . H/O cardiac catheterization 2004   Normal coronary arteries  . Heart murmur   . History of stress test 05/21/2011  . Hx of echocardiogram 01/27/2010   Normal Ef 55% the transmitral spectral doppler flow pattern is normal for age. the left ventricular  wall motion is normal  . Migraines   . Thyroid disease   . TIA (transient ischemic attack)    8 years ago    Past Surgical History:  Procedure Laterality Date  . BREAST BIOPSY     Isaiah Blakes  . CARDIAC CATHETERIZATION     10/2013  . LEFT HEART CATHETERIZATION WITH CORONARY ANGIOGRAM N/A 10/25/2013   Procedure: LEFT HEART CATHETERIZATION WITH CORONARY ANGIOGRAM;  Surgeon: Troy Sine, MD;  Location: [facility] CATH LAB;  Service: Cardiovascular;  Laterality: N/A;  . NECK SURGERY     x's 2  . TOTAL KNEE ARTHROPLASTY     Bilateral x's 2  .  VAGINAL HYSTERECTOMY  10/17/1998   Dory Horn    Social History  Substance Use Topics  . Smoking status: Former Smoker    Years: 2.00    Quit date: 04/06/1969  . Smokeless tobacco: Never Used     Comment: socially smoked x 2 years  . Alcohol use No    Family History  Problem Relation Age of Onset  . Arthritis    . Colon cancer Father   . Prostate cancer Father   . HIV Brother     57  . Kidney cancer Brother   . Colon cancer Brother   . Lung cancer Brother   . Other Brother     Mouth Cancer  . Ovarian cancer Mother   . Uterine cancer Mother   . Heart disease Maternal Grandmother   . Stroke Maternal Grandmother   . Hypertension Maternal Grandmother   . Diabetes Maternal Grandmother     Allergies  Allergen Reactions  . Prednisone Other (See Comments)    Homicidal  . Atrovent Hfa [Ipratropium Bromide Hfa] Other (See Comments)    Pt could not sleep  . Cheratussin Ac [Guaifenesin-Codeine]   . Hydrocodone Itching  . Influenza Vaccines Other (See Comments)    Pt reports heart attack after last flu shot  . Motrin [Ibuprofen] Other (See Comments)    "Gives false reading in blood"    Medication list has been reviewed and updated.  Current Outpatient Prescriptions on File Prior to Visit  Medication Sig Dispense Refill  . Ascorbic Acid (VITAMIN C PO) Take 1 Dose by mouth 4 (four) times daily.    . benzonatate (TESSALON) 100 MG capsule Take 1 capsule (100 mg total) by mouth 2 (two) times daily as needed for cough. 20 capsule 0  . Biotin (RA BIOTIN) 1000 MCG tablet Take 1,000 mcg by mouth 2 (two) times daily.     Marland Kitchen BREWERS YEAST PO Take 2 tablets by mouth 2 (two) times daily.     . busPIRone (BUSPAR) 15 MG tablet Take 1 tablet (15 mg total) by mouth 2 (two) times daily. 60 tablet 3  . clopidogrel (PLAVIX) 75 MG tablet TAKE 1 TABLET BY MOUTH EVERY DAY 90 tablet 1  . dicyclomine (BENTYL) 10 MG capsule TAKE 1-2 CAPSULES (10-20 MG TOTAL) BY MOUTH 4 (FOUR) TIMES DAILY - BEFORE  MEALS AND AT BEDTIME. 360 capsule 0  . dicyclomine (BENTYL) 20 MG tablet Take 1 tablet by mouth daily.    Marland Kitchen estradiol (ESTRACE) 2 MG tablet Take 2 mg by mouth daily.  11  . furosemide (LASIX) 40 MG tablet TAKE 1 TABLET (40 MG TOTAL) BY MOUTH 2 (TWO) TIMES DAILY. 180 tablet 1  . KLOR-CON M20 20 MEQ tablet TAKE 2 TABLETS (40 MEQ TOTAL) BY MOUTH DAILY. 180 tablet 1  . lactulose, encephalopathy, (CHRONULAC) 10 GM/15ML SOLN  TAKE 30 MLS (20 G TOTAL) BY MOUTH EVERY 6 (SIX) HOURS AS NEEDED. 1892 mL 1  . levofloxacin (LEVAQUIN) 500 MG tablet TAKE 1 TABLET (500 MG TOTAL) BY MOUTH DAILY. 7 tablet 0  . Multiple Vitamin (MULTIVITAMIN) tablet Take 1 tablet by mouth daily.    Marland Kitchen SYNTHROID 112 MCG tablet TAKE 1 TABLET BY MOUTH BEFORE BREAKFAST 90 tablet 1  . vitamin D, CHOLECALCIFEROL, 400 UNITS tablet Take 400 Units by mouth 2 (two) times daily.      No current facility-administered medications on file prior to visit.     Review of Systems:  As per HPI- otherwise negative.   Physical Examination: Vitals:   06/03/16 1054  BP: 106/72  Pulse: 71  Temp: 98.8 F (37.1 C)   Vitals:   06/03/16 1054  Weight: 140 lb 6.4 oz (63.7 kg)   Body mass index is 24.87 kg/m. Ideal Body Weight:    GEN: WDWN, NAD, Non-toxic, A & O x 3, looks well, normal weight  HEENT: Atraumatic, Normocephalic. Neck supple. No masses, No LAD. Ears and Nose: No external deformity. CV: RRR, No M/G/R. No JVD. No thrill. No extra heart sounds. PULM: CTA B, no wheezes, crackles, rhonchi. No retractions. No resp. distress. No accessory muscle use. ABD: S, ND, +BS. No rebound. No HSM.  Continues to have tenderness in her epigastric/ LUQ EXTR: No c/c.  She wears support stocking bilaterally and does have trace edema of both legs still  NEURO Normal gait.  PSYCH: Normally interactive. Conversant. Not depressed or anxious appearing.  Calm demeanor.    Assessment and Plan: Chronic abdominal pain  Hypokalemia - Plan: Basic  metabolic panel, Vitamin D (25 hydroxy)  Osteopenia, unspecified location - Plan: Vitamin D (25 hydroxy)  History of vitamin D deficiency  Encounter for hepatitis C screening test for low risk patient - Plan: Hepatitis C antibody  Vitamin D deficiency - Plan: Vitamin D (25 hydroxy)  Lower extremity edema   Here today to discuss a few concerns Will check her Vitamin d level She also needs her Hep C screening Continue current lasix and K supplementation for her LE edema She understands the thinking behind seeing GI prior to vascualr and will keep her upcoming GI appt  Will plan further follow- up pending labs.   Signed Lamar Blinks, MD

## 2016-06-03 NOTE — Patient Instructions (Signed)
Please let me know what your GI doctor says- if they feel that your GI system is NOT the source of your pain the next step will be a referral to vascular surgery. Please keep me posted-  I will be in touch with your labs asap

## 2016-06-04 ENCOUNTER — Encounter: Payer: Self-pay | Admitting: Family Medicine

## 2016-06-05 ENCOUNTER — Telehealth: Payer: Self-pay | Admitting: Family Medicine

## 2016-06-05 ENCOUNTER — Encounter: Payer: Self-pay | Admitting: *Deleted

## 2016-06-05 NOTE — Telephone Encounter (Signed)
Patient notified.  Results are in pickup drawer up front.

## 2016-06-05 NOTE — Telephone Encounter (Signed)
Caller name: Lindie  Relation to pt: self  Call back number: [phone] Pharmacy:  Reason for call: Pt came in the office stating is needing copy of her last lab results and a copy of her MRI the results with the dye, pt states has an appt with Gertie Fey or Neurologist and is needing these copies by Monday morning 06-08-2016. Please advise.

## 2016-06-08 DIAGNOSIS — R938 Abnormal findings on diagnostic imaging of other specified body structures: Secondary | ICD-10-CM | POA: Diagnosis not present

## 2016-06-08 DIAGNOSIS — R1012 Left upper quadrant pain: Secondary | ICD-10-CM | POA: Diagnosis not present

## 2016-06-09 ENCOUNTER — Encounter: Payer: Self-pay | Admitting: Neurology

## 2016-06-09 ENCOUNTER — Ambulatory Visit (INDEPENDENT_AMBULATORY_CARE_PROVIDER_SITE_OTHER): Payer: Medicare Other | Admitting: Neurology

## 2016-06-09 VITALS — BP 148/79 | HR 61 | Ht 63.0 in | Wt 138.2 lb

## 2016-06-09 DIAGNOSIS — G43109 Migraine with aura, not intractable, without status migrainosus: Secondary | ICD-10-CM | POA: Diagnosis not present

## 2016-06-09 DIAGNOSIS — G43909 Migraine, unspecified, not intractable, without status migrainosus: Secondary | ICD-10-CM | POA: Insufficient documentation

## 2016-06-09 NOTE — Progress Notes (Signed)
[facility]    Provider:  Dr Jaynee Eagles Referring Provider: Carollee Herter, Alferd Apa, * Primary Care Physician:  [name], DO  CC:  Numbness after panic attack  Interval history 06/09/2016: Patient here for follow up of left facial numbness, tingling in the setting of a panic attack and recent stress. MRI of the brain and CTA of the neck normal. She still has left face tingling every once in a while and her eye itches. Otherwise it has been fine. More annoying. She has headaches. She was hyperventilating and went to the ED and MRI brain was stable. A friend of hers has passed away and she was very anxious, she couldn't speak but could hear everything. She couldn't breathe, she was taking deeper breaths. She was taking albuterol at the time. Her left face tingling may be migrainous she does have a history and says she has pain around the eye.   HPI:  Alexandria [name] is a 71 y.o. female here as a referral from Dr. Carollee Herter for numbness on the left side after panic attacks that radiates to the neck and shoulder. Past medical history of migraine, heart disease, thyroid disease, TIA, chronic venous insufficiency, fatigue, insomnia. She has tingling in the left side of the face. This started in August out of the blue, she was singing and she had a panic attack.  She panicked, couldn't do anything. No alteration of awareness. Lasted for 20-25 minutes then she was ok. Symptoms of left-sided numbness, tingling started during the panic attack. It has improved. Right now she is feeling it on the left side of the whole face and feels tingling. No recent migraines, has been years since headache. Vision changes on the left eye and she is going to see Dr. Bing Plume. She has been to the dentist. The tinlging is annoying. Her Bp is elevated she is normally 120s/70s and today 150s/80s. Advised to watch BP and increased BP can cause paresthesias. She has blurry vision in the left eye. Never had  symptoms before. No focal weakness, no headache, no difficulties talking or walking, otherwise no changes or other focal neurologic deficits. No other associated symptoms or modifying factors.   She has been having anxiety.  Reviewed notes, labs and imaging from outside physicians, which showed: Reviewed notes in Epic. Patient reported that she had a "panic attack" last month patient was in a singing group and felt like she was frozen and went blank which lasted for a few seconds. She was unable to sing. No loss of consciousness. No alteration of awareness. She was able to join back in and completed the practice session. We have she felt very tired. The next day she felt her left face feeling numb and tingly. This persisted. Radiates into her head and neck. No facial drooping, slurred speech, difficulty speaking or swallowing, no headaches and changes in her vision or hearing, no trauma to her head or neck recently. Patient has a history of panic attacks, remote panic attacks. Patient had a TIA 11 years ago in [place], she lost memory for several days then knows that she was in the hospital and recovered fully. No residual symptoms. She had an MI 13-14 years ago, cardiac cath in 2004 and again in 2015. She did not complain of any chest pain or shortness of breath with her symptoms.   MRI brain 12/07/2015: Personally reviewed images and agree with the following: Major intracranial vascular flow voids appear stable since 2004. Cerebral volume has not significantly changed. Cavum septum pellucidum (normal variant). No restricted diffusion to suggest acute infarction. No midline shift, mass effect, evidence of mass lesion, ventriculomegaly, extra-axial collection or acute intracranial  hemorrhage. Cervicomedullary junction and pituitary are within normal limits.  Scattered small mostly subcortical white matter foci of T2 and FLAIR hyperintensity which are nonspecific. These are progressed since 2004 and the extent is mild to moderate for age. No cortical encephalomalacia or chronic cerebral blood products. Deep gray matter nuclei, brainstem, an the cerebellum are normal.  Visible internal auditory structures appear normal. Visualized paranasal sinuses and mastoids are stable and well pneumatized. Negative orbit and scalp soft tissues. Normal bone marrow signal. Negative visualized cervical spine.  IMPRESSION: 1. No acute intracranial abnormality. 2. Mild to moderate for age nonspecific cerebral white matter signal changes, most commonly due to chronic small vessel disease.  CTA of the neck 12/05/2015:  Skeleton: Sequelae of previous C5 through C7 cervical fusion. Upper cervical facet degeneration, severe on the left at C4-C5. No acute osseous abnormality identified.  Visualized paranasal sinuses and mastoids are stable and well pneumatized.  Upper chest: Mild subpleural scarring in the visible lungs. No superior mediastinal lymphadenopathy.  Other neck: Absent thyroid. Negative larynx, pharynx, parapharyngeal spaces, retropharyngeal space, sublingual space, submandibular glands (the right appears partially atrophic) and parotid glands. No cervical lymphadenopathy.  Grossly stable and negative visualized brain parenchyma. Visualized orbits and scalp soft tissues are within normal limits.  Aortic arch: 3 vessel arch configuration. Mild to moderate calcified arch atherosclerosis.  Right carotid system: Mildly tortuous proximal right CCA. Minimal calcified plaque at the posterior right ICA origin. Negative cervical right ICA aside from mild tortuosity.  Visible right ICA siphon and terminus are patent with calcified atherosclerosis.  Left  carotid system: Mildly tortuous proximal left CCA. Calcified plaque at the left carotid bifurcation resulting in less than 50 % stenosis with respect to the distal vessel. Mildly tortuous but otherwise negative cervical left ICA.  Visible left ICA siphon and terminus are patent with calcified atherosclerosis.  Vertebral arteries:No proximal right subclavian artery stenosis. Normal right vertebral artery origin. Mildly dominant right vertebral artery is normal to the skullbase. The right PICA origin is patent. There is right V4 segment calcified plaque  resulting in mild to moderate stenosis. Patent vertebrobasilar junction. Negative visible basilar artery.  No proximal left subclavian artery stenosis despite calcified plaque. Normal left vertebral artery origin. Negative left vertebral artery in the neck. The left vertebral is mildly non dominant. Normal left PICA origin. No distal left vertebral stenosis.  IMPRESSION: 1. No dissection or acute arterial finding identified in the neck. 2. Mild cervical carotid atherosclerosis, primarily on the left, will without stenosis. More moderate visible ICA siphon and aortic calcified atherosclerosis. 3. No vertebral artery atherosclerosis or stenosis in the neck. There is mild to moderate distal right vertebral artery stenosis related to calcified plaque. 4. No acute findings identified in the neck. Previous cervical fusion. Severe left cervical facet degeneration maximal at C4-C5.  Review of Systems: Patient complains of symptoms per HPI as well as the following symptoms: Palpitations, blurred vision, easy bruising, joint pain, joint swelling, ringing in the ears, constipation, allergies, confusion. Pertinent negatives per HPI. All others negative.  Social History   Social History  . Marital status: Married    Spouse name: [name]  . Number of children: 3  . Years of education: [employer]   Occupational History  . DISABLED  Unemployed   Social History Main Topics  . Smoking status: Former Smoker    Years: 2.00    Quit date: 04/06/1969  . Smokeless tobacco: Never Used     Comment: socially smoked x 2 years  . Alcohol use No  . Drug use: No  . Sexual activity: Not on file   Other Topics Concern  . Not on file   Social History Narrative   Lives with husband [name]   Caffeine use: coffee (2 cups per day)   Mostly right-handed    Family History  Problem Relation Age of Onset  . Arthritis    . Colon cancer Father   . Prostate cancer Father   . HIV Brother     78  . Kidney cancer Brother   . Colon cancer Brother   . Lung cancer Brother   . Other Brother     Mouth Cancer  . Ovarian cancer Mother   . Uterine cancer Mother   . Heart disease Maternal Grandmother   . Stroke Maternal Grandmother   . Hypertension Maternal Grandmother   . Diabetes Maternal Grandmother     Past Medical History:  Diagnosis Date  . Arthritis   . Asthmatic bronchitis with acute exacerbation 02/06/2015  . Bronchial pneumonia   . Colon polyp   . Diverticulitis   . DVT (deep venous thrombosis) ([place])    lower extremity  . H/O cardiac catheterization 2004   Normal coronary arteries  . Heart murmur   . History of stress test 05/21/2011  . Hx of echocardiogram 01/27/2010   Normal Ef 55% the transmitral spectral doppler flow pattern is normal for age. the left ventricular wall motion is normal  . Migraines   . Thyroid disease   . TIA (transient ischemic attack)    8 years ago    Past Surgical History:  Procedure Laterality Date  . BREAST BIOPSY     Isaiah Blakes  . CARDIAC CATHETERIZATION     10/2013  . LEFT HEART CATHETERIZATION WITH CORONARY ANGIOGRAM N/A 10/25/2013   Procedure: LEFT HEART CATHETERIZATION WITH CORONARY ANGIOGRAM;  Surgeon: Troy Sine, MD;  Location[facility] C[facility] LAB;  Service: Cardiovascular;  Laterality: N/A;  . NECK SURGERY     x's 2  . TOTAL KNEE ARTHROPLASTY  Bilateral x's 2  . VAGINAL  HYSTERECTOMY  10/17/1998   Dory Horn    Current Outpatient Prescriptions  Medication Sig Dispense Refill  . Ascorbic Acid (VITAMIN C PO) Take 1 Dose by mouth 4 (four) times daily.    . Biotin (RA BIOTIN) 1000 MCG tablet Take 1,000 mcg by mouth 2 (two) times daily.     Marland Kitchen BREWERS YEAST PO Take 2 tablets by mouth 2 (two) times daily.     . busPIRone (BUSPAR) 15 MG tablet Take 1 tablet (15 mg total) by mouth 2 (two) times daily. (Patient taking differently: Take 15 mg by mouth at bedtime. ) 60 tablet 3  . clopidogrel (PLAVIX) 75 MG tablet TAKE 1 TABLET BY MOUTH EVERY DAY 90 tablet 1  . dicyclomine (BENTYL) 20 MG tablet Take 1 tablet by mouth 4 (four) times daily.     Marland Kitchen estradiol (ESTRACE) 2 MG tablet Take 2 mg by mouth daily.  11  . furosemide (LASIX) 40 MG tablet TAKE 1 TABLET (40 MG TOTAL) BY MOUTH 2 (TWO) TIMES DAILY. 180 tablet 1  . KLOR-CON M20 20 MEQ tablet TAKE 2 TABLETS (40 MEQ TOTAL) BY MOUTH DAILY. 180 tablet 1  . lactulose, encephalopathy, (CHRONULAC) 10 GM/15ML SOLN TAKE 30 MLS (20 G TOTAL) BY MOUTH EVERY 6 (SIX) HOURS AS NEEDED. 1892 mL 1  . Multiple Vitamin (MULTIVITAMIN) tablet Take 1 tablet by mouth daily.    Marland Kitchen SYNTHROID 112 MCG tablet TAKE 1 TABLET BY MOUTH BEFORE BREAKFAST 90 tablet 1  . vitamin D, CHOLECALCIFEROL, 400 UNITS tablet Take 400 Units by mouth 2 (two) times daily.      No current facility-administered medications for this visit.     Allergies as of 06/09/2016 - Review Complete 06/09/2016  Allergen Reaction Noted  . Prednisone Other (See Comments) 12/18/2011  . Atrovent hfa [ipratropium bromide hfa] Other (See Comments) 02/14/2015  . Cheratussin ac [guaifenesin-codeine]  01/29/2015  . Hydrocodone Itching 09/28/2012  . Influenza vaccines Other (See Comments) 01/30/2015  . Motrin [ibuprofen] Other (See Comments) 10/23/2013    Vitals: BP (!) 148/79   Pulse 61   Ht 5' 3" (1.6 m)   Wt 138 lb 3.2 oz (62.7 kg)   BMI 24.48 kg/m  Last Weight:  Wt Readings from  Last 1 Encounters:  06/09/16 138 lb 3.2 oz (62.7 kg)   Last Height:   Ht Readings from Last 1 Encounters:  06/09/16 5' 3" (1.6 m)    Physical exam: Exam: Gen: NAD, conversant, well nourised, obese, well groomed                     CV: RRR, no MRG. No Carotid Bruits. No peripheral edema, warm, nontender Eyes: Conjunctivae clear without exudates or hemorrhage  Neuro: Detailed Neurologic Exam  Speech:    Speech is normal; fluent and spontaneous with normal comprehension.  Cognition:    The patient is oriented to person, place, and time;     recent and remote memory intact;     language fluent;     normal attention, concentration,     fund of knowledge Cranial Nerves:    The pupils are equal, round, and reactive to light. The fundi are normal and spontaneous venous pulsations are present. Visual fields are full to finger confrontation. Extraocular movements are intact. Splits midline to pin prick and vibration, the muscles of mastication are normal. The face is symmetric. The palate elevates in the midline. Hearing intact. Voice is normal. Shoulder shrug  is normal. The tongue has normal motion without fasciculations.   Coordination:    No dysmetria  Gait:    Normal native gait  Motor Observation:    No asymmetry, no atrophy, and no involuntary movements noted. Tone:    Normal muscle tone.    Posture:    Posture is normal. normal erect    Strength: Giveway throughout but appears symmetric and normal       Sensation: intact to LT      Reflex Exam:  DTR's: Absent AJs, she declined patellars, normal uppers.    Toes:    The toes are downgoing bilaterally.   Clonus:    Clonus is absent.      Assessment/Plan:  Patient with left facial numbness, tingling in the setting of a panic attack and recent stress also may be complicated  igraines. MRI of the brain and CTA of the neck normal. Her blood pressure has been elevated which may cause paresthesias. Patient  splits midline on the forehead with pin prick and vibration suspect anxiety component, MRI of the brain and CTA of the head without etiology. Reassured patient. Will get an esr/crp to eval left eye blurry vision and r/o temporal arteritis however no other symptoms or signs and likelihood is very low.   May be complicated migraines.  CC: [name], DO  A total of 15 minutes was spent face-to-face with this patient. Over half this time was spent on counseling patient on the complicated migraine diagnosis and different diagnostic and therapeutic options available.

## 2016-06-09 NOTE — Patient Instructions (Signed)
Remember to drink plenty of fluid, eat healthy meals and do not skip any meals. Try to eat protein with a every meal and eat a healthy snack such as fruit or nuts in between meals. Try to keep a regular sleep-wake schedule and try to exercise daily, particularly in the form of walking, 20-30 minutes a day, if you can.   Our phone number is [phone]. We also have an after hours call service for urgent matters and there is a physician on-call for urgent questions. For any emergencies you know to call 911 or go to the nearest emergency room  To prevent or relieve headaches, try the following: Cool Compress. Lie down and place a cool compress on your head.  Avoid headache triggers. If certain foods or odors seem to have triggered your migraines in the past, avoid them. A headache diary might help you identify triggers.  Include physical activity in your daily routine. Try a daily walk or other moderate aerobic exercise.  Manage stress. Find healthy ways to cope with the stressors, such as delegating tasks on your to-do list.  Practice relaxation techniques. Try deep breathing, yoga, massage and visualization.  Eat regularly. Eating regularly scheduled meals and maintaining a healthy diet might help prevent headaches. Also, drink plenty of fluids.  Follow a regular sleep schedule. Sleep deprivation might contribute to headaches Consider biofeedback. With this mind-body technique, you learn to control certain bodily functions - such as muscle tension, heart rate and blood pressure - to prevent headaches or reduce headache pain.    Proceed to emergency room if you experience new or worsening symptoms or symptoms do not resolve, if you have new neurologic symptoms or if headache is severe, or for any concerning symptom.

## 2016-06-20 ENCOUNTER — Other Ambulatory Visit: Payer: Self-pay | Admitting: Family Medicine

## 2016-06-26 ENCOUNTER — Ambulatory Visit: Payer: Medicare Other | Admitting: Gastroenterology

## 2016-06-29 DIAGNOSIS — R1012 Left upper quadrant pain: Secondary | ICD-10-CM | POA: Diagnosis not present

## 2016-07-13 ENCOUNTER — Encounter: Payer: Self-pay | Admitting: Vascular Surgery

## 2016-07-17 DIAGNOSIS — K099 Cyst of oral region, unspecified: Secondary | ICD-10-CM | POA: Diagnosis not present

## 2016-07-20 ENCOUNTER — Telehealth: Payer: Self-pay | Admitting: Family Medicine

## 2016-07-20 NOTE — Telephone Encounter (Signed)
Caller name: Alexandria [name] Relation to pt: self Call back number: [phone] Pharmacy:  Reason for call: Pt dropped off a copy of letter from Vascular and pt is needing to have all documents that has to do with Vascular so that she can take it with her at her next appt on Wednesday 07-22-2016. Pt stated Dr Lorelei Pont did the test and needing the results from her so that she can take all documents to her appt. (pt will pick up copies if possible by tomorrow).  Please advise.

## 2016-07-20 NOTE — Telephone Encounter (Signed)
Alexandria [name]- please give her a call.  I put her form and a copy of her recent CT abdomen in your box.  However I think everything that the vascular doc will need is already in the computer system and they should be able to see everything ok.

## 2016-07-20 NOTE — Telephone Encounter (Signed)
Letter of pt's appt put at front office tray.

## 2016-07-21 NOTE — Telephone Encounter (Signed)
Tried to contact pt. No answer left message informing pt that requested dorms left at the front for pick up.

## 2016-07-22 ENCOUNTER — Encounter: Payer: Self-pay | Admitting: Vascular Surgery

## 2016-07-22 ENCOUNTER — Ambulatory Visit (INDEPENDENT_AMBULATORY_CARE_PROVIDER_SITE_OTHER): Payer: Medicare Other | Admitting: Vascular Surgery

## 2016-07-22 VITALS — BP 157/81 | HR 58 | Temp 97.0°F | Resp 16 | Ht 63.0 in | Wt 138.0 lb

## 2016-07-22 DIAGNOSIS — R1013 Epigastric pain: Secondary | ICD-10-CM

## 2016-07-22 DIAGNOSIS — G8929 Other chronic pain: Secondary | ICD-10-CM

## 2016-07-22 NOTE — Progress Notes (Signed)
Referring Physician: Acquanetta Sit, MD  Patient name: Alexandria [name] MRN: [id number] DOB: [birth year] Sex: female  REASON FOR CONSULT: chronic mesenteric occlusive disease  HPI: Alexandria [name] is a 71 y.o. female with an 8 year history of chronic left upper quadrant abdominal pain. She has been followed by Dr. Penelope Coop in the past. She recently had a CT scan of the abdomen and pelvis which showed moderate narrowing of the celiac and superior mesenteric artery. She was sent for further evaluation of this. She denies food fear. She denies weight loss. She denies any real pain association with eating meals. She states that usually the pain is resolved after having a bowel movement. She states that the pain has become progressively worse over the last 8 years. She is a former smoker but has not smoked for almost 50 years. She did have a TIA in the past about 12 years ago and an extensive workup at that time. She has not had any neurologic events since then. She denies any claudication symptoms. She states she is intentionally trying to lose weight right now but has been unsuccessful at this. She did have a left leg DVT many years ago but is currently not on anticoagulation. This was over 20 years ago. She is currently on Plavix. Other medical problems include multi joint degenerative arthritis, bronchitis both of which have been stable.  She also complains of some intermittent chronic leg swelling. She was evaluated by my partner Dr. Bridgett Larsson in 2016 and followed to have a component of postphlebitic syndrome. She is not really bothered by her legs other than the appearance sometimes with multiple prominent varicosities.   Past Medical History:  Diagnosis Date  . Arthritis   . Asthmatic bronchitis with acute exacerbation 02/06/2015  . Bronchial pneumonia   . Colon polyp   . Diverticulitis   . DVT (deep venous thrombosis) ([place])    lower extremity  . H/O cardiac catheterization 2004   Normal  coronary arteries  . Heart murmur   . History of stress test 05/21/2011  . Hx of echocardiogram 01/27/2010   Normal Ef 55% the transmitral spectral doppler flow pattern is normal for age. the left ventricular wall motion is normal  . Migraines   . Thyroid disease   . TIA (transient ischemic attack)    8 years ago   Past Surgical History:  Procedure Laterality Date  . BREAST BIOPSY     Isaiah Blakes  . CARDIAC CATHETERIZATION     10/2013  . LEFT HEART CATHETERIZATION WITH CORONARY ANGIOGRAM N/A 10/25/2013   Procedure: LEFT HEART CATHETERIZATION WITH CORONARY ANGIOGRAM;  Surgeon: Troy Sine, MD;  Location: [facility] CATH LAB;  Service: Cardiovascular;  Laterality: N/A;  . NECK SURGERY     x's 2  . TOTAL KNEE ARTHROPLASTY     Bilateral x's 2  . VAGINAL HYSTERECTOMY  10/17/1998   Dory Horn    Family History  Problem Relation Age of Onset  . Arthritis    . Colon cancer Father   . Prostate cancer Father   . HIV Brother     19  . Kidney cancer Brother   . Colon cancer Brother   . Lung cancer Brother   . Other Brother     Mouth Cancer  . Ovarian cancer Mother   . Uterine cancer Mother   . Heart disease Maternal Grandmother   . Stroke Maternal Grandmother   . Hypertension Maternal Grandmother   . Diabetes Maternal Grandmother  SOCIAL HISTORY: Social History   Social History  . Marital status: Married    Spouse name: [name]  . Number of children: 3  . Years of education: [employer]   Occupational History  . DISABLED Unemployed   Social History Main Topics  . Smoking status: Former Smoker    Years: 2.00    Quit date: 04/06/1969  . Smokeless tobacco: Never Used     Comment: socially smoked x 2 years  . Alcohol use No  . Drug use: No  . Sexual activity: Not on file   Other Topics Concern  . Not on file   Social History Narrative   Lives with husband [name]   Caffeine use: coffee (2 cups per day)   Mostly right-handed    Allergies  Allergen Reactions  .  Prednisone Other (See Comments)    Homicidal  . Atrovent Hfa [Ipratropium Bromide Hfa] Other (See Comments)    Pt could not sleep  . Cheratussin Ac [Guaifenesin-Codeine]   . Hydrocodone Itching  . Influenza Vaccines Other (See Comments)    Pt reports heart attack after last flu shot  . Motrin [Ibuprofen] Other (See Comments)    "Gives false reading in blood"    Current Outpatient Prescriptions  Medication Sig Dispense Refill  . Ascorbic Acid (VITAMIN C PO) Take 1 Dose by mouth 4 (four) times daily.    . Biotin (RA BIOTIN) 1000 MCG tablet Take 1,000 mcg by mouth 2 (two) times daily.     Marland Kitchen BREWERS YEAST PO Take 2 tablets by mouth 2 (two) times daily.     . busPIRone (BUSPAR) 15 MG tablet Take 1 tablet (15 mg total) by mouth 2 (two) times daily. (Patient taking differently: Take 15 mg by mouth at bedtime. ) 60 tablet 3  . clopidogrel (PLAVIX) 75 MG tablet TAKE 1 TABLET BY MOUTH EVERY DAY 90 tablet 1  . dicyclomine (BENTYL) 20 MG tablet Take 1 tablet by mouth 4 (four) times daily.     Marland Kitchen dicyclomine (BENTYL) 20 MG tablet TAKE 1 TABLET BY MOUTH 4 TIMES A DAY AS NEEDED 360 tablet 1  . estradiol (ESTRACE) 2 MG tablet Take 2 mg by mouth daily.  11  . furosemide (LASIX) 40 MG tablet TAKE 1 TABLET (40 MG TOTAL) BY MOUTH 2 (TWO) TIMES DAILY. 180 tablet 1  . KLOR-CON M20 20 MEQ tablet TAKE 2 TABLETS (40 MEQ TOTAL) BY MOUTH DAILY. 180 tablet 1  . Linaclotide (LINZESS PO) Take by mouth daily.    . Multiple Vitamin (MULTIVITAMIN) tablet Take 1 tablet by mouth daily.    Marland Kitchen SYNTHROID 112 MCG tablet TAKE 1 TABLET BY MOUTH BEFORE BREAKFAST 90 tablet 1  . vitamin D, CHOLECALCIFEROL, 400 UNITS tablet Take 400 Units by mouth 2 (two) times daily.     Marland Kitchen lactulose, encephalopathy, (CHRONULAC) 10 GM/15ML SOLN TAKE 30 MLS (20 G TOTAL) BY MOUTH EVERY 6 (SIX) HOURS AS NEEDED. (Patient not taking: Reported on 07/22/2016) 1892 mL 1   No current facility-administered medications for this visit.     ROS:   General:   No weight loss, Fever, chills  HEENT: No recent headaches, no nasal bleeding, no visual changes, no sore throat  Neurologic: No dizziness, blackouts, seizures. No recent symptoms of stroke or mini- stroke. No recent episodes of slurred speech, or temporary blindness.  Cardiac: No recent episodes of chest pain/pressure, no shortness of breath at rest.  + shortness of breath with exertion.  Denies history of atrial fibrillation  or irregular heartbeat  Vascular: No history of rest pain in feet.  No history of claudication.  No history of non-healing ulcer, + history of DVT   Pulmonary: No home oxygen, no productive cough, no hemoptysis,  No asthma or wheezing  Musculoskeletal:  [X]  Arthritis, [ ]  Low back pain,  [X]  Joint pain  Hematologic:No history of hypercoagulable state.  No history of easy bleeding.  No history of anemia  Gastrointestinal: No hematochezia or melena,  No gastroesophageal reflux, no trouble swallowing  Urinary: [ ]  chronic Kidney disease, [ ]  on HD - [ ]  MWF or [ ]  TTHS, [ ]  Burning with urination, [ ]  Frequent urination, [ ]  Difficulty urinating;   Skin: No rashes  Psychological: No history of anxiety,  No history of depression   Physical Examination  Vitals:   07/22/16 1022 07/22/16 1033  BP: (!) 149/83 (!) 157/81  Pulse: (!) 58 (!) 58  Resp: 16   Temp: 97 F (36.1 C)   SpO2: 97%   Weight: 138 lb (62.6 kg)   Height: 5\' 3"  (1.6 m)     Body mass index is 24.45 kg/m.  General:  Alert and oriented, no acute distress HEENT: Normal Neck: No bruit or JVD Pulmonary: Clear to auscultation bilaterally Cardiac: Regular Rate and Rhythm without murmur Abdomen: Soft, non-tender with the exception of mild left upper quadrant tenderness, non-distended, no mass Skin: No rash, scattered spider-type varicosities bilateral lower extremities Extremity Pulses:  2+ radial, brachial, femoral and popliteal pulses bilaterally 2+ dorsalis pedis, posterior tibial pulses right  side 2+ left posterior tibial pulse absent dorsalis pedis  Musculoskeletal: No deformity or edema  Neurologic: Upper and lower extremity motor 5/5 and symmetric  DATA:  I reviewed the patient's CT scan of abdomen and pelvis today. On my measurements her superior mesenteric artery has a 60% proximal stenosis. The celiac artery has a 50% proximal stenosis. The inferior mesenteric artery appears to be widely patent.  ASSESSMENT:  Patient with chronic left upper quadrant abdominal pain. This has been going on for about 8 years. I doubt this is necessarily related to mesenteric artery occlusive disease due to a long time course. She does have evidence of some mesenteric occlusive disease but these lesions are both less than 60% with a widely patent inferior mesenteric artery. I discussed with the patient today that her abdominal pain symptoms and history are not really consistent with mesenteric ischemia. She denies food fear. She has not lost weight. Her pain does not seem to be associated with meals. However, she does have some moderate stenosis and I told her that potentially this could be related to mesenteric occlusive disease but doubtful. If her symptoms became more progressive over time she will probably warrants a repeat scan. Otherwise I would continue to treat her as per Dr. Penelope Coop for motility and chronic abdominal pain type syndrome.  As far as her lower extremity swelling is concerned I discussed with her the possibility of further workup of her veins that she is not really interested in this and wanted to mainly make sure that she was not at risk for toe or limb loss. I reassured her that her arterial circulation is intact and that she would have some nuisance problems with her veins but otherwise could wear just compression stockings for this.   PLAN:  Patient will follow-up on as-needed basis if her symptoms change over time or become more progressive or she develops food fear or weight loss.  Otherwise she  will continue follow-up for her GI symptoms with Dr. Penelope Coop.   Ruta Hinds, MD [facility] Office: [phone] Pager: [phone]

## 2016-07-22 NOTE — Progress Notes (Signed)
Vitals:   07/22/16 1022  BP: (!) 149/83  Pulse: (!) 58  Resp: 16  Temp: 97 F (36.1 C)  SpO2: 97%  Weight: 138 lb (62.6 kg)  Height: 5\' 3"  (1.6 m)

## 2016-08-12 ENCOUNTER — Ambulatory Visit (INDEPENDENT_AMBULATORY_CARE_PROVIDER_SITE_OTHER): Payer: Medicare Other | Admitting: Family Medicine

## 2016-08-12 ENCOUNTER — Telehealth: Payer: Self-pay | Admitting: Family Medicine

## 2016-08-12 VITALS — BP 124/82 | HR 69 | Temp 98.0°F | Ht 63.0 in | Wt 140.2 lb

## 2016-08-12 DIAGNOSIS — R600 Localized edema: Secondary | ICD-10-CM | POA: Diagnosis not present

## 2016-08-12 DIAGNOSIS — I499 Cardiac arrhythmia, unspecified: Secondary | ICD-10-CM | POA: Diagnosis not present

## 2016-08-12 DIAGNOSIS — E876 Hypokalemia: Secondary | ICD-10-CM | POA: Diagnosis not present

## 2016-08-12 DIAGNOSIS — K5792 Diverticulitis of intestine, part unspecified, without perforation or abscess without bleeding: Secondary | ICD-10-CM

## 2016-08-12 DIAGNOSIS — E039 Hypothyroidism, unspecified: Secondary | ICD-10-CM

## 2016-08-12 LAB — COMPREHENSIVE METABOLIC PANEL
ALBUMIN: 4.2 g/dL (ref 3.5–5.2)
ALT: 15 U/L (ref 0–35)
AST: 19 U/L (ref 0–37)
Alkaline Phosphatase: 63 U/L (ref 39–117)
BILIRUBIN TOTAL: 0.5 mg/dL (ref 0.2–1.2)
BUN: 12 mg/dL (ref 6–23)
CALCIUM: 9.2 mg/dL (ref 8.4–10.5)
CHLORIDE: 100 meq/L (ref 96–112)
CO2: 32 mEq/L (ref 19–32)
CREATININE: 0.99 mg/dL (ref 0.40–1.20)
GFR: 58.66 mL/min — ABNORMAL LOW (ref >60.00)
Glucose, Bld: 88 mg/dL (ref 70–99)
Potassium: 3.1 mEq/L — ABNORMAL LOW (ref 3.5–5.1)
SODIUM: 138 meq/L (ref 135–145)
TOTAL PROTEIN: 7.3 g/dL (ref 6.0–8.3)

## 2016-08-12 LAB — CBC
HEMATOCRIT: 40.3 % (ref 36.0–46.0)
Hemoglobin: 13.8 g/dL (ref 12.0–15.0)
MCHC: 34.2 g/dL (ref 30.0–36.0)
MCV: 96.2 fl (ref 78.0–100.0)
Platelets: 195 10*3/uL (ref 150.0–400.0)
RBC: 4.19 Mil/uL (ref 3.87–5.11)
RDW: 13.3 % (ref 11.5–15.5)
WBC: 4.7 10*3/uL (ref 4.0–10.5)

## 2016-08-12 MED ORDER — AMOXICILLIN-POT CLAVULANATE 875-125 MG PO TABS: 1.0000 | ORAL_TABLET | Freq: Two times a day (BID) | ORAL | 0 refills | Status: DC

## 2016-08-12 NOTE — Patient Instructions (Addendum)
It was good to see you today- take care and we will treat you for diverticulitis today with augmetin for 10 days.  I will be in touch with your labs Please let me know if you are not improving over the next couple of days Continue to eat a light, easy to digest diet for the next few days

## 2016-08-12 NOTE — Telephone Encounter (Signed)
Caller name: Mandee Relation to pt: self Call back number: [phone] Pharmacy:  Reason for call: Pt was seen in our office today 08-12-2016 and stated forgot to inform the PCP that lately she is getting out of breath easily. Pt wanted to inform the provider.

## 2016-08-12 NOTE — Progress Notes (Addendum)
[facility] [address], [place], [place] [address] [phone]  Date:  08/12/2016   Name:  Alexandria [name]   DOB:  September 18, 1945   MRN:  [id number]  PCP:  [name], DO    Chief Complaint: Diverticulitis (c/o diverticulitis flare up that started yesterday. )   History of Present Illness:  Alexandria [name] is a 71 y.o. very pleasant female patient who presents with the following:  History of chronic belly pain and diverticulitis.  Here today with concern of possible diverticulitis   She did see vascular surgery in April for her narrowing of celiac and superior mesenteric arteries seen on CT- however they did NOT feel that she likely has mesenteric occlusive diease or that any surgical intervention would be helpful at this time  She last had CT proven diverticulitis in 2016 Yesterday she noted onset of what she thinks is diverticulitis.  She noted chills, worsening of her usual belly pain.  She feels bloated. No dysuria.   She feels nauseated but no vomiting She did not measure a fever but did have chills and sweats last night.    She did have diarrhea yesterday- mild.  2 stools so far today No blood in her stool but "it was a funny color."  She will sometimes wear a pad during the day in case of anal leakeage.   She is eating a light diet- soup.    No CP or SOB.  She does have palpitations on occasion - she has noted these for a couple of years at least.  Known to have a RBBB on old EKG, pt thinks she was told that she had an irregular heart rhythm as well.   She did have a negative cath about 3 years ago    Patient Active Problem List   Diagnosis Date Noted  . Complicated migraine 99/24/2683  . Insomnia 11/18/2015  . Chest pain at rest 05/20/2015  . RBBB 05/20/2015  . Abnormal CXR 05/20/2015  . History of MI (myocardial infarction) 05/08/2015  . Bruising 05/08/2015  . Upper airway cough  syndrome 02/14/2015  . Asthmatic bronchitis with acute exacerbation 02/06/2015  . Hypothyroidism 10/25/2014  . Fatigue 10/25/2014  . 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 Chronic venous insufficiency 10/24/2014  . Varicose veins of leg with complications 41/96/2229  . Degeneration of intervertebral disc of cervical region 06/29/2014  . Palpitations 11/08/2013  . SOB (shortness of breath) 10/03/2013  . DOE (dyspnea on exertion) 10/03/2013  . Edema of both legs 10/03/2013  . Hair loss 11/25/2012  . Diverticulitis 07/14/2012  . Hammer toe 06/13/2012  . Recurrent abdominal pain 04/17/2012  . Hypoglycemia 01/06/2012    Past Medical History:  Diagnosis Date  . Arthritis   . Asthmatic bronchitis with acute exacerbation 02/06/2015  . Bronchial pneumonia   . Colon polyp   . Diverticulitis   . DVT (deep venous thrombosis) ([place])    lower extremity  . H/O cardiac catheterization 2004   Normal coronary arteries  . Heart murmur   . History of stress test 05/21/2011  . Hx of echocardiogram 01/27/2010   Normal Ef 55% the transmitral spectral doppler flow pattern is normal for age. the left ventricular wall motion is normal  . Migraines   . Thyroid disease   . TIA (transient ischemic attack)    8 years ago    Past Surgical History:  Procedure Laterality Date  . BREAST  BIOPSY     Isaiah Blakes  . CARDIAC CATHETERIZATION     10/2013  . LEFT HEART CATHETERIZATION WITH CORONARY ANGIOGRAM N/A 10/25/2013   Procedure: LEFT HEART CATHETERIZATION WITH CORONARY ANGIOGRAM;  Surgeon: Troy Sine, MD;  Location: [facility] CATH LAB;  Service: Cardiovascular;  Laterality: N/A;  . NECK SURGERY     x's 2  . TOTAL KNEE ARTHROPLASTY     Bilateral x's 2  . VAGINAL HYSTERECTOMY  10/17/1998   Dory Horn    Social History  Substance Use Topics  . Smoking status: Former Smoker    Years: 2.00    Quit date: 04/06/1969  . Smokeless tobacco: Never Used     Comment: socially smoked x 2 years  . Alcohol use No     Family History  Problem Relation Age of Onset  . Arthritis    . Colon cancer Father   . Prostate cancer Father   . HIV Brother     75  . Kidney cancer Brother   . Colon cancer Brother   . Lung cancer Brother   . Other Brother     Mouth Cancer  . Ovarian cancer Mother   . Uterine cancer Mother   . Heart disease Maternal Grandmother   . Stroke Maternal Grandmother   . Hypertension Maternal Grandmother   . Diabetes Maternal Grandmother     Allergies  Allergen Reactions  . Prednisone Other (See Comments)    Changed personality   . Atrovent Hfa [Ipratropium Bromide Hfa] Other (See Comments)    Pt could not sleep  . Cheratussin Ac [Guaifenesin-Codeine]   . Hydrocodone Itching  . Influenza Vaccines Other (See Comments)    Pt reports heart attack after last flu shot  . Motrin [Ibuprofen] Other (See Comments)    "Gives false reading in blood"    Medication list has been reviewed and updated.  Current Outpatient Prescriptions on File Prior to Visit  Medication Sig Dispense Refill  . Ascorbic Acid (VITAMIN C PO) Take 1 Dose by mouth 4 (four) times daily.    . Biotin (RA BIOTIN) 1000 MCG tablet Take 1,000 mcg by mouth 2 (two) times daily.     Marland Kitchen BREWERS YEAST PO Take 2 tablets by mouth 2 (two) times daily.     . busPIRone (BUSPAR) 15 MG tablet Take 1 tablet (15 mg total) by mouth 2 (two) times daily. (Patient taking differently: Take 15 mg by mouth at bedtime. ) 60 tablet 3  . clopidogrel (PLAVIX) 75 MG tablet TAKE 1 TABLET BY MOUTH EVERY DAY 90 tablet 1  . dicyclomine (BENTYL) 20 MG tablet Take 1 tablet by mouth 4 (four) times daily.     Marland Kitchen estradiol (ESTRACE) 2 MG tablet Take 2 mg by mouth daily.  11  . furosemide (LASIX) 40 MG tablet TAKE 1 TABLET (40 MG TOTAL) BY MOUTH 2 (TWO) TIMES DAILY. 180 tablet 1  . KLOR-CON M20 20 MEQ tablet TAKE 2 TABLETS (40 MEQ TOTAL) BY MOUTH DAILY. 180 tablet 1  . lactulose, encephalopathy, (CHRONULAC) 10 GM/15ML SOLN TAKE 30 MLS (20 G TOTAL)  BY MOUTH EVERY 6 (SIX) HOURS AS NEEDED. 1892 mL 1  . Linaclotide (LINZESS PO) Take by mouth daily.    . Multiple Vitamin (MULTIVITAMIN) tablet Take 1 tablet by mouth daily.    Marland Kitchen SYNTHROID 112 MCG tablet TAKE 1 TABLET BY MOUTH BEFORE BREAKFAST 90 tablet 1  . vitamin D, CHOLECALCIFEROL, 400 UNITS tablet Take 400 Units by mouth 2 (two) times daily.  No current facility-administered medications on file prior to visit.     Review of Systems:  As per HPI- otherwise negative.   Physical Examination: Vitals:   08/12/16 1020  BP: 124/82  Pulse: 69  Temp: 98 F (36.7 C)   Vitals:   08/12/16 1020  Weight: 140 lb 3.2 oz (63.6 kg)  Height: 5\' 3"  (1.6 m)   Body mass index is 24.84 kg/m. Ideal Body Weight: Weight in (lb) to have BMI = 25: 140.8  GEN: WDWN, NAD, Non-toxic, A & O x 3, slim build, looks well HEENT: Atraumatic, Normocephalic. Neck supple. No masses, No LAD. Ears and Nose: No external deformity. CV: frequent early beats- ? bigeminy, No M/G/R. No JVD. No thrill. No extra heart sounds. PULM: CTA B, no wheezes, crackles, rhonchi. No retractions. No resp. distress. No accessory muscle use. ABD: S, ND, +BS. No rebound. No HSM.  Pt notes mild TTP in the LLQ EXTR: No c/c/e NEURO Normal gait.  PSYCH: Normally interactive. Conversant. Not depressed or anxious appearing.  Calm demeanor.   EKG: compared with last EKG: stable right BBB, she had 3 PVCs on her tracing today.  Otherwise no change. Nothing acutely concerning today Assessment and Plan: Acute diverticulitis - Plan: Comprehensive metabolic panel, CBC, amoxicillin-clavulanate (AUGMENTIN) 875-125 MG tablet  Irregular heart beat - Plan: EKG 12-Lead  Hypokalemia - Plan: Comprehensive metabolic panel  Lower extremity edema  Acquired hypothyroidism - Plan: TSH  Here today with concern of diverticulitis Will obtain labs and start on augmentin today Continue low residue diet See patient instructions for more details.      Signed Lamar Blinks, MD  Received her labs 5/11 and gave her a call Let her know that her potassium is low - she is taking 20 meq BID right now,  However she is also having diarrhea.  Asked her to take her K TID until diarrhea resolves She states that she feels the urge to defecate "like when you are having a baby and they tell you to push." asked if she was ok, offered to get her in to be seen today.  She declines any further assistance at this time   Results for orders placed or performed in visit on 08/12/16  Comprehensive metabolic panel  Result Value Ref Range   Sodium 138 135 - 145 mEq/L   Potassium 3.1 (L) 3.5 - 5.1 mEq/L   Chloride 100 96 - 112 mEq/L   CO2 32 19 - 32 mEq/L   Glucose, Bld 88 70 - 99 mg/dL   BUN 12 6 - 23 mg/dL   Creatinine, Ser 0.99 0.40 - 1.20 mg/dL   Total Bilirubin 0.5 0.2 - 1.2 mg/dL   Alkaline Phosphatase 63 39 - 117 U/L   AST 19 0 - 37 U/L   ALT 15 0 - 35 U/L   Total Protein 7.3 6.0 - 8.3 g/dL   Albumin 4.2 3.5 - 5.2 g/dL   Calcium 9.2 8.4 - 10.5 mg/dL   GFR 58.66 (L) >60.00 mL/min  TSH  Result Value Ref Range   TSH 0.93 0.35 - 4.50 uIU/mL  CBC  Result Value Ref Range   WBC 4.7 4.0 - 10.5 K/uL   RBC 4.19 3.87 - 5.11 Mil/uL   Platelets 195.0 150.0 - 400.0 K/uL   Hemoglobin 13.8 12.0 - 15.0 g/dL   HCT 40.3 36.0 - 46.0 %   MCV 96.2 78.0 - 100.0 fl   MCHC 34.2 30.0 - 36.0 g/dL   RDW 13.3 11.5 -  15.5 %     

## 2016-08-13 LAB — TSH: TSH: 0.93 u[IU]/mL (ref 0.35–4.50)

## 2016-08-13 NOTE — Telephone Encounter (Signed)
Patient called back because she has additional questions for Dr. Lorelei Pont. Request a call back

## 2016-08-13 NOTE — Telephone Encounter (Signed)
Called her back but no answer.  LMOM- please call back if she still needs me. Please let me know what her questions are so I can give answers to staff

## 2016-08-13 NOTE — Telephone Encounter (Signed)
Called her back- she reports that she has noted intermittent SOB for a few weeks.   I actually asked her about this yesterday and she denied any CP or SOB Asked her to come and see me asap to discuss this further and she plans to do so. If any worsening or changes in the meantime she will go to the ER

## 2016-08-14 ENCOUNTER — Ambulatory Visit (INDEPENDENT_AMBULATORY_CARE_PROVIDER_SITE_OTHER): Payer: Medicare Other | Admitting: Family Medicine

## 2016-08-14 ENCOUNTER — Telehealth: Payer: Self-pay | Admitting: Family Medicine

## 2016-08-14 ENCOUNTER — Encounter: Payer: Self-pay | Admitting: Family Medicine

## 2016-08-14 VITALS — BP 110/70 | HR 73 | Temp 99.1°F | Ht 63.0 in | Wt 138.8 lb

## 2016-08-14 DIAGNOSIS — K5792 Diverticulitis of intestine, part unspecified, without perforation or abscess without bleeding: Secondary | ICD-10-CM

## 2016-08-14 DIAGNOSIS — E876 Hypokalemia: Secondary | ICD-10-CM

## 2016-08-14 MED ORDER — POTASSIUM CHLORIDE ER 10 MEQ PO TBCR: 20.0000 meq | EXTENDED_RELEASE_TABLET | Freq: Three times a day (TID) | ORAL | 1 refills | Status: DC

## 2016-08-14 MED ORDER — ONDANSETRON HCL 4 MG PO TABS: 4.0000 mg | ORAL_TABLET | Freq: Three times a day (TID) | ORAL | 0 refills | Status: DC | PRN

## 2016-08-14 NOTE — Telephone Encounter (Signed)
Called pt who states that diverticulitis pain is still present and that she now has diarrhea. Made appt at 2:15 pm today  for pt to come in to be seen by Dr. Nani Ravens who is the Doctor of the day today.

## 2016-08-14 NOTE — Patient Instructions (Addendum)
Stay on antibiotic. If you have worsening symptoms or can't keep anything down, seek immediate care.  Gatorade, Powerade, Pedialyte to rehydrate.

## 2016-08-14 NOTE — Telephone Encounter (Signed)
°  Relation to V[facility]:SJWT Call back number:[phone]   Reason for call:  Past last seen by Dr. Lorelei Pont 08/12/16 for Diverticulitis, patient states she was advised to inform MD if lower abdominal pain didn't improve, informed patient MD is offsite today and covering MD will follow up

## 2016-08-14 NOTE — Progress Notes (Signed)
Chief Complaint  Patient presents with  . Follow-up    on acute diverticulitis    Subjective: Patient is a 71 y.o. female here for diverticulitus.  The patient has an extensive history of diverticulitis. She states this is within the range of her typical exacerbations, however is more severe than usual. She is currently on Augmentin twice daily and was seen 2 days ago. She's been experiencing some nausea as of today, however no vomiting. Her stools are loose and black. Her oral intake is poor. She is still able to keep down her medicine. While she denies measuring her temperature, she is having some shaking.  ROS: Const- no fevers GI- as noted in HPI  Family History  Problem Relation Age of Onset  . Arthritis Unknown   . Colon cancer Father   . Prostate cancer Father   . HIV Brother        11  . Kidney cancer Brother   . Colon cancer Brother   . Lung cancer Brother   . Other Brother        Mouth Cancer  . Ovarian cancer Mother   . Uterine cancer Mother   . Heart disease Maternal Grandmother   . Stroke Maternal Grandmother   . Hypertension Maternal Grandmother   . Diabetes Maternal Grandmother    Past Medical History:  Diagnosis Date  . Arthritis   . Asthmatic bronchitis with acute exacerbation 02/06/2015  . Bronchial pneumonia   . Colon polyp   . Diverticulitis   . DVT (deep venous thrombosis) ([place])    lower extremity  . H/O cardiac catheterization 2004   Normal coronary arteries  . Heart murmur   . History of stress test 05/21/2011  . Hx of echocardiogram 01/27/2010   Normal Ef 55% the transmitral spectral doppler flow pattern is normal for age. the left ventricular wall motion is normal  . Migraines   . Thyroid disease   . TIA (transient ischemic attack)    8 years ago   Allergies  Allergen Reactions  . Prednisone Other (See Comments)    Changed personality   . Atrovent Hfa [Ipratropium Bromide Hfa] Other (See Comments)    Pt could not sleep  .  Cheratussin Ac [Guaifenesin-Codeine]   . Hydrocodone Itching  . Influenza Vaccines Other (See Comments)    Pt reports heart attack after last flu shot  . Motrin [Ibuprofen] Other (See Comments)    "Gives false reading in blood"    Current Outpatient Prescriptions:  .  amoxicillin-clavulanate (AUGMENTIN) 875-125 MG tablet, Take 1 tablet by mouth 2 (two) times daily., Disp: 20 tablet, Rfl: 0 .  Ascorbic Acid (VITAMIN C PO), Vitamin C 500mg -Take 2-4 capsules by mouth daily., Disp: , Rfl:  .  Biotin (RA BIOTIN) 1000 MCG tablet, Take 1,000 mcg by mouth 2 (two) times daily. , Disp: , Rfl:  .  BREWERS YEAST PO, Take 2 tablets by mouth 2 (two) times daily. , Disp: , Rfl:  .  busPIRone (BUSPAR) 15 MG tablet, Take 1 tablet (15 mg total) by mouth 2 (two) times daily. (Patient taking differently: Take 15 mg by mouth at bedtime. ), Disp: 60 tablet, Rfl: 3 .  clopidogrel (PLAVIX) 75 MG tablet, TAKE 1 TABLET BY MOUTH EVERY DAY, Disp: 90 tablet, Rfl: 1 .  dicyclomine (BENTYL) 20 MG tablet, Take 1 tablet by mouth 4 (four) times daily. , Disp: , Rfl:  .  estradiol (ESTRACE) 2 MG tablet, Take 2 mg by mouth daily., Disp: ,  Rfl: 11 .  FOLIC ACID PO, Folic Acid 50mg -Take 1 tablet by mouth daily., Disp: , Rfl:  .  furosemide (LASIX) 40 MG tablet, TAKE 1 TABLET (40 MG TOTAL) BY MOUTH 2 (TWO) TIMES DAILY., Disp: 180 tablet, Rfl: 1 .  lactulose, encephalopathy, (CHRONULAC) 10 GM/15ML SOLN, TAKE 30 MLS (20 G TOTAL) BY MOUTH EVERY 6 (SIX) HOURS AS NEEDED., Disp: 1892 mL, Rfl: 1 .  LINZESS 145 MCG CAPS capsule, TAKE ONE CAPSULE BY MOUTH WITH FIRST MEAL OF THE DAY, Disp: , Rfl: 3 .  Multiple Vitamin (MULTIVITAMIN) tablet, Take 1 tablet by mouth daily., Disp: , Rfl:  .  nebivolol (BYSTOLIC) 5 MG tablet, Take by mouth., Disp: , Rfl:  .  OVER THE COUNTER MEDICATION, Omega-3 Fatty acids-Vitamin E (fish oil) 1 capsule by mouth daily., Disp: , Rfl:  .  SYNTHROID 112 MCG tablet, TAKE 1 TABLET BY MOUTH BEFORE BREAKFAST, Disp: 90  tablet, Rfl: 1 .  vitamin D, CHOLECALCIFEROL, 400 UNITS tablet, Take 400 Units by mouth 2 (two) times daily. , Disp: , Rfl:  .  ondansetron (ZOFRAN) 4 MG tablet, Take 1 tablet (4 mg total) by mouth every 8 (eight) hours as needed for nausea or vomiting., Disp: 20 tablet, Rfl: 0 .  potassium chloride (K-DUR) 10 MEQ tablet, Take 2 tablets (20 mEq total) by mouth 3 (three) times daily., Disp: 180 tablet, Rfl: 1  Objective: BP 110/70 (BP Location: Left Arm, Patient Position: Sitting, Cuff Size: Normal)   Pulse 73   Temp 99.1 F (37.3 C) (Oral)   Ht 5\' 3"  (1.6 m)   Wt 138 lb 12.8 oz (63 kg)   SpO2 96%   BMI 24.59 kg/m  General: Awake, appears stated age HEENT: Mucous membranes are dry, EOMi Heart: RRR, brisk capillary refill Lungs: CTAB, no rales, wheezes or rhonchi. No accessory muscle use Abd: BS+, soft, diffusely tender to palpation, worse on the left lower quadrant, ND, no masses or organomegaly Psych: Age appropriate judgment and insight, normal affect and mood  Assessment and Plan: Diverticulitis - Plan: ondansetron (ZOFRAN) 4 MG tablet  Hypokalemia - Plan: potassium chloride (K-DUR) 10 MEQ tablet  Orders as above. Zofran as needed. Cont Augmentin, likely needs a couple more days for inflammation to calm down. Encouraged oral hydration with Gatorade, Powerade, or Pedialyte. We'll change her potassium to 10 mEq tabs to see if this is easier to swallow. Seek immediate care if worsening symptoms. RTO if no improvement. Fu prn. The patient voiced understanding and agreement to the plan.  [place], DO 08/14/16  5:26 PM

## 2016-09-02 ENCOUNTER — Encounter: Payer: Self-pay | Admitting: Family Medicine

## 2016-09-02 ENCOUNTER — Ambulatory Visit (INDEPENDENT_AMBULATORY_CARE_PROVIDER_SITE_OTHER): Payer: Medicare Other | Admitting: Family Medicine

## 2016-09-02 VITALS — BP 132/70 | HR 67 | Temp 98.1°F | Ht 63.0 in | Wt 140.6 lb

## 2016-09-02 DIAGNOSIS — R109 Unspecified abdominal pain: Secondary | ICD-10-CM | POA: Diagnosis not present

## 2016-09-02 LAB — BASIC METABOLIC PANEL
BUN: 8 mg/dL (ref 6–23)
CO2: 34 mEq/L — ABNORMAL HIGH (ref 19–32)
CREATININE: 0.82 mg/dL (ref 0.40–1.20)
Calcium: 9 mg/dL (ref 8.4–10.5)
Chloride: 100 mEq/L (ref 96–112)
GFR: 72.89 mL/min (ref >60.00)
GLUCOSE: 92 mg/dL (ref 70–99)
Potassium: 3.4 mEq/L — ABNORMAL LOW (ref 3.5–5.1)
Sodium: 140 mEq/L (ref 135–145)

## 2016-09-02 LAB — LIPASE: LIPASE: 16 U/L (ref 11.0–59.0)

## 2016-09-02 NOTE — Progress Notes (Signed)
[place] at [facility] [address], [place], [place] [address] [phone]  Date:  09/02/2016   Name:  Alexandria [name]   DOB:  March 15, 1946   MRN:  [id number]  PCP:  [name], DO    Chief Complaint: Follow-up (Pt here for f/u visit. Finished Amoxicillin but still having pain. )   History of Present Illness:  Alexandria [name] is a 71 y.o. very pleasant female patient who presents with the following:  We have seen here a few times recently with apparent diverticulitis pain- I saw her on 5/9 and started her on augmentin for possible diverticulitis flare She then returned on 5/11 and saw Dr. Nani Ravens - he continued her augmentin and added zofran for her She does need a repeat K level today as this has been low  Here today because she continues to notice pain her belly that has not gone away for several weeks.  Most recent CT of her belly in February of this year was negative- nothing of concern, see report below.  Vascular surgery has evaluated her and does not feel there is any significance to findings concerning her celiac and mesenteric arteries.  IMPRESSION: Aortic atherosclerosis.  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  No other significant abnormality seen in the abdomen or pelvis.  She notes that in the mornings she will feel full and like she can't eat.  She is having loose stools- she is taking linzess every couple of days for constipation.  This does help her but can cause diarrhea No vomiting- however this morning she did feel nauseated  She has seen Acquanetta Sit at [place] in the past but did not follow-up with him.    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 Complicated migraine 18/29/9371  . Insomnia 11/18/2015  . Chest pain at rest 05/20/2015  . RBBB 05/20/2015  . Abnormal CXR 05/20/2015  . History of MI (myocardial infarction) 05/08/2015  . Bruising 05/08/2015  . Upper airway cough syndrome 02/14/2015  . Asthmatic bronchitis with acute exacerbation 02/06/2015  . Hypothyroidism 10/25/2014  . Fatigue 10/25/2014  . 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 Chronic venous insufficiency 10/24/2014  . Varicose veins of leg with complications 69/67/8938  . Degeneration of intervertebral disc of cervical region 06/29/2014  . Palpitations 11/08/2013  . SOB (shortness of breath) 10/03/2013  . DOE (dyspnea on exertion) 10/03/2013  . Edema of both legs 10/03/2013  . Hair loss 11/25/2012  . Diverticulitis 07/14/2012  . Hammer toe 06/13/2012  . Recurrent abdominal pain 04/17/2012  . Hypoglycemia 01/06/2012    Past Medical History:  Diagnosis Date  . Arthritis   . Asthmatic bronchitis with acute exacerbation 02/06/2015  . Bronchial pneumonia   . Colon polyp   . Diverticulitis   . DVT (deep venous thrombosis) ([place])    lower extremity  . H/O cardiac catheterization 2004   Normal coronary arteries  . Heart murmur   .  History of stress test 05/21/2011  . Hx of echocardiogram 01/27/2010   Normal Ef 55% the transmitral spectral doppler flow pattern is normal for age. the left ventricular wall motion is normal  . Migraines   . Thyroid disease   . TIA (transient ischemic attack)    8 years ago    Past Surgical History:  Procedure Laterality Date  . BREAST BIOPSY     Isaiah Blakes  . CARDIAC CATHETERIZATION     10/2013  . LEFT HEART CATHETERIZATION WITH CORONARY ANGIOGRAM N/A 10/25/2013   Procedure: LEFT HEART CATHETERIZATION WITH CORONARY ANGIOGRAM;  Surgeon: Troy Sine, MD;  Location: [facility] CATH LAB;  Service: Cardiovascular;  Laterality: N/A;  . NECK SURGERY     x's 2  . TOTAL KNEE ARTHROPLASTY     Bilateral x's 2  . VAGINAL HYSTERECTOMY  10/17/1998   Dory Horn    Social History  Substance Use Topics  . Smoking status: Former Smoker    Years: 2.00    Quit date: 04/06/1969  . Smokeless tobacco: Never Used     Comment: socially smoked x 2 years  . Alcohol use No    Family History  Problem Relation Age of Onset  . Arthritis Unknown   . Colon cancer Father   . Prostate cancer Father   . HIV Brother        50  . Kidney cancer Brother   . Colon cancer Brother   . Lung cancer Brother   . Other Brother        Mouth Cancer  . Ovarian cancer Mother   . Uterine cancer Mother   . Heart disease Maternal Grandmother   . Stroke Maternal Grandmother   . Hypertension Maternal Grandmother   . Diabetes Maternal Grandmother     Allergies  Allergen Reactions  . Prednisone Other (See Comments)    Changed personality   . Atrovent Hfa [Ipratropium Bromide Hfa] Other (See Comments)    Pt could not sleep  . Cheratussin Ac [Guaifenesin-Codeine]   . Hydrocodone Itching  . Influenza Vaccines Other (See Comments)    Pt reports heart attack after last flu shot  . Motrin [Ibuprofen] Other (See Comments)    "Gives false reading in blood"    Medication list has been reviewed and updated.  Current Outpatient Prescriptions on File Prior to Visit  Medication Sig Dispense Refill  . Ascorbic Acid (VITAMIN C PO) Vitamin C 500mg -Take 2-4 capsules by mouth daily.    . Biotin (RA BIOTIN) 1000 MCG tablet Take 1,000 mcg by mouth 2 (two) times daily.     Marland Kitchen BREWERS YEAST PO Take 2 tablets by mouth 2 (two) times daily.     . busPIRone (BUSPAR) 15 MG tablet Take 1 tablet (15 mg total) by mouth 2 (two) times daily. (Patient taking differently: Take 15 mg by mouth at bedtime. ) 60 tablet 3  . clopidogrel (PLAVIX) 75 MG tablet TAKE 1 TABLET BY MOUTH EVERY DAY 90 tablet  1  . dicyclomine (BENTYL) 20 MG tablet Take 1 tablet by mouth 4 (four) times daily.     Marland Kitchen estradiol (ESTRACE) 2 MG tablet Take 2 mg by mouth daily.  11  . FOLIC ACID PO Folic Acid 50mg -Take 1 tablet by mouth daily.    . furosemide (LASIX) 40 MG tablet TAKE 1 TABLET (40 MG TOTAL) BY MOUTH 2 (TWO) TIMES DAILY. 180 tablet 1  . lactulose, encephalopathy, (CHRONULAC) 10 GM/15ML SOLN TAKE 30 MLS (20 G TOTAL) BY  MOUTH EVERY 6 (SIX) HOURS AS NEEDED. 1892 mL 1  . LINZESS 145 MCG CAPS capsule TAKE ONE CAPSULE BY MOUTH WITH FIRST MEAL OF THE DAY  3  . Multiple Vitamin (MULTIVITAMIN) tablet Take 1 tablet by mouth daily.    . nebivolol (BYSTOLIC) 5 MG tablet Take by mouth.    . ondansetron (ZOFRAN) 4 MG tablet Take 1 tablet (4 mg total) by mouth every 8 (eight) hours as needed for nausea or vomiting. 20 tablet 0  . OVER THE COUNTER MEDICATION Omega-3 Fatty acids-Vitamin E (fish oil) 1 capsule by mouth daily.    . potassium chloride (K-DUR) 10 MEQ tablet Take 2 tablets (20 mEq total) by mouth 3 (three) times daily. 180 tablet 1  . SYNTHROID 112 MCG tablet TAKE 1 TABLET BY MOUTH BEFORE BREAKFAST 90 tablet 1  . vitamin D, CHOLECALCIFEROL, 400 UNITS tablet Take 400 Units by mouth 2 (two) times daily.      No current facility-administered medications on file prior to visit.     Review of Systems:  As per HPI- otherwise negative.   Physical Examination: Vitals:   09/02/16 1049  BP: 132/70  Pulse: 67  Temp: 98.1 F (36.7 C)   Vitals:   09/02/16 1049  Weight: 140 lb 9.6 oz (63.8 kg)  Height: 5\' 3"  (1.6 m)   Body mass index is 24.91 kg/m. Ideal Body Weight: Weight in (lb) to have BMI = 25: 140.8  GEN: WDWN, NAD, Non-toxic, A & O x 3, normal weight, looks well HEENT: Atraumatic, Normocephalic. Neck supple. No masses, No LAD. Ears and Nose: No external deformity. CV: RRR, No M/G/R. No JVD. No thrill. No extra heart sounds. PULM: CTA B, no wheezes, crackles, rhonchi. No retractions. No resp.  distress. No accessory muscle use. ABD: S, ND, +BS. No rebound. No HSM.  She notes mild tenderness in the left upper quadrant today EXTR: No c/c/e NEURO Normal gait.  PSYCH: Normally interactive. Conversant. Not depressed or anxious appearing.  Calm demeanor.    Assessment and Plan: Left sided abdominal pain - Plan: Basic metabolic panel, Lipase  Here today with persistent abd pain.  She has been treated for diverticulitis and her pain is no longer acute- just persistent.  I have seen her a few times over the last 5 months with this concern.  She did seek GI care but has not followed up with them.  Encouraged her to do so, and I will repeat her lipase and BMP today She will let me know if any change in her sx.  However due to duration of her sx and recent negative CT scan doubt any dangerous pathology   Signed Lamar Blinks, MD

## 2016-09-02 NOTE — Patient Instructions (Signed)
I will check your labs today and be in touch asap!  Please do give Dr. Penelope Coop a call and schedule a follow-up visit with him asap  Phone: [phone]

## 2016-09-07 ENCOUNTER — Other Ambulatory Visit: Payer: Self-pay | Admitting: Family Medicine

## 2016-09-07 DIAGNOSIS — F418 Other specified anxiety disorders: Secondary | ICD-10-CM

## 2016-09-11 DIAGNOSIS — Z1231 Encounter for screening mammogram for malignant neoplasm of breast: Secondary | ICD-10-CM | POA: Diagnosis not present

## 2016-09-11 DIAGNOSIS — Z803 Family history of malignant neoplasm of breast: Secondary | ICD-10-CM | POA: Diagnosis not present

## 2016-09-11 LAB — HM MAMMOGRAPHY

## 2016-09-15 DIAGNOSIS — Z6824 Body mass index (BMI) 24.0-24.9, adult: Secondary | ICD-10-CM | POA: Diagnosis not present

## 2016-09-15 DIAGNOSIS — Z124 Encounter for screening for malignant neoplasm of cervix: Secondary | ICD-10-CM | POA: Diagnosis not present

## 2016-09-15 DIAGNOSIS — Z1272 Encounter for screening for malignant neoplasm of vagina: Secondary | ICD-10-CM | POA: Diagnosis not present

## 2016-09-15 DIAGNOSIS — Z01419 Encounter for gynecological examination (general) (routine) without abnormal findings: Secondary | ICD-10-CM | POA: Diagnosis not present

## 2016-09-18 ENCOUNTER — Other Ambulatory Visit: Payer: Self-pay | Admitting: Gastroenterology

## 2016-09-18 DIAGNOSIS — R1012 Left upper quadrant pain: Secondary | ICD-10-CM

## 2016-09-22 ENCOUNTER — Ambulatory Visit
Admission: RE | Admit: 2016-09-22 | Discharge: 2016-09-22 | Disposition: A | Discharge status: Home / Self Care | Payer: Medicare Other | Source: Ambulatory Visit | Attending: Gastroenterology | Admitting: Gastroenterology

## 2016-09-22 DIAGNOSIS — R1012 Left upper quadrant pain: Secondary | ICD-10-CM

## 2016-09-23 ENCOUNTER — Encounter: Payer: Self-pay | Admitting: *Deleted

## 2016-10-02 ENCOUNTER — Other Ambulatory Visit: Payer: Self-pay | Admitting: Cardiovascular Disease

## 2016-10-02 NOTE — Telephone Encounter (Signed)
Rx(s) sent to pharmacy electronically.  

## 2016-10-11 NOTE — Progress Notes (Signed)
[employer] at [facility] [address], [place], [place] [address] [phone]  Date:  10/12/2016   Name:  Alexandria [name]   DOB:  1945/10/28   MRN:  [id number]  PCP:  Darreld Mclean, MD    Chief Complaint: Follow-up (Pt here to )   History of Present Illness:  Alexandria [name] is a 71 y.o. very pleasant female patient who presents with the following:  Here today for a recheck visit Last seen by myself about 6 weeks ago with persistent abdominal discomfort.  We had treated her for presumed diverticulitis and did a CT scan  Since then she has been to [facility]  States that she is here for "a follow-up more than anything else."  She has been released by GI.  Told that her UGI was normal and to call if there was anything else that GI could do to help her She states that she is doing "the same."  Her BP is a little bit high today She continue to have left sided upper abd pain nearly all the time States that she is not eating well- however her weight is stable No vomiting Notes that she "feels full all the time, same old stuff different day."   She has noted that when going up stairs she may feel SOB- she has noted this for more than a month.  Not getting worse, she does not have any chest pain  We are trying to find a reason for her persistent pain- discussed her part CV history.  Back in summer 2015 she had complaint of dyspnea.  She saw pulmonology who felt that her sx were not pulmonary but suggested a cardiac eval.   She went on toe have a positive stress (possibly consistent with a remote apical MI) and then a cath which was normal- July 2015  Wt Readings from Last 3 Encounters:  10/12/16 140 lb (63.5 kg)  09/02/16 140 lb 9.6 oz (63.8 kg)  08/14/16 138 lb 12.8 oz (63 kg)     BP Readings from Last 3 Encounters:  10/12/16 118/80  09/02/16 132/70  08/14/16 110/70   Lab Results  Component Value Date   TSH 0.93  08/12/2016     Patient Active Problem List   Diagnosis Date Noted  . Complicated migraine 22/63/3354  . Insomnia 11/18/2015  . Chest pain at rest 05/20/2015  . RBBB 05/20/2015  . Abnormal CXR 05/20/2015  . History of MI (myocardial infarction) 05/08/2015  . Bruising 05/08/2015  . Upper airway cough syndrome 02/14/2015  . Asthmatic bronchitis with acute exacerbation 02/06/2015  . Hypothyroidism 10/25/2014  . Fatigue 10/25/2014  . 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 Chronic venous insufficiency 10/24/2014  . Varicose veins of leg with complications 56/25/6389  . Degeneration of intervertebral disc of cervical region 06/29/2014  . Palpitations 11/08/2013  . SOB (shortness of breath) 10/03/2013  . DOE (dyspnea on exertion) 10/03/2013  . Edema of both legs 10/03/2013  . Hair loss 11/25/2012  . Diverticulitis 07/14/2012  . Hammer toe 06/13/2012  . Recurrent abdominal pain 04/17/2012  . Hypoglycemia 01/06/2012    Past Medical History:  Diagnosis Date  . Arthritis   . Asthmatic bronchitis with acute exacerbation 02/06/2015  . Bronchial pneumonia   . Colon polyp   . Diverticulitis   . DVT (deep venous thrombosis) ([place])    lower extremity  . H/O cardiac catheterization 2004   Normal coronary arteries  .  Heart murmur   . History of stress test 05/21/2011  . Hx of echocardiogram 01/27/2010   Normal Ef 55% the transmitral spectral doppler flow pattern is normal for age. the left ventricular wall motion is normal  . Migraines   . Thyroid disease   . TIA (transient ischemic attack)    8 years ago    Past Surgical History:  Procedure Laterality Date  . BREAST BIOPSY     Isaiah Blakes  . CARDIAC CATHETERIZATION     10/2013  . LEFT HEART CATHETERIZATION WITH CORONARY ANGIOGRAM N/A 10/25/2013   Procedure: LEFT HEART CATHETERIZATION WITH CORONARY ANGIOGRAM;  Surgeon: Troy Sine, MD;  Location: [facility] CATH LAB;  Service: Cardiovascular;  Laterality: N/A;  . NECK SURGERY     x's 2   . TOTAL KNEE ARTHROPLASTY     Bilateral x's 2  . VAGINAL HYSTERECTOMY  10/17/1998   Dory Horn    Social History  Substance Use Topics  . Smoking status: Former Smoker    Years: 2.00    Quit date: 04/06/1969  . Smokeless tobacco: Never Used     Comment: socially smoked x 2 years  . Alcohol use No    Family History  Problem Relation Age of Onset  . Arthritis Unknown   . Colon cancer Father   . Prostate cancer Father   . HIV Brother        35  . Kidney cancer Brother   . Colon cancer Brother   . Lung cancer Brother   . Other Brother        Mouth Cancer  . Ovarian cancer Mother   . Uterine cancer Mother   . Heart disease Maternal Grandmother   . Stroke Maternal Grandmother   . Hypertension Maternal Grandmother   . Diabetes Maternal Grandmother     Allergies  Allergen Reactions  . Prednisone Other (See Comments)    Changed personality   . Atrovent Hfa [Ipratropium Bromide Hfa] Other (See Comments)    Pt could not sleep  . Cheratussin Ac [Guaifenesin-Codeine]   . Hydrocodone Itching  . Influenza Vaccines Other (See Comments)    Pt reports heart attack after last flu shot  . Motrin [Ibuprofen] Other (See Comments)    "Gives false reading in blood"    Medication list has been reviewed and updated.  Current Outpatient Prescriptions on File Prior to Visit  Medication Sig Dispense Refill  . Ascorbic Acid (VITAMIN C PO) Vitamin C 500mg -Take 2-4 capsules by mouth daily.    . Biotin (RA BIOTIN) 1000 MCG tablet Take 1,000 mcg by mouth 2 (two) times daily.     Marland Kitchen BREWERS YEAST PO Take 2 tablets by mouth 2 (two) times daily.     . busPIRone (BUSPAR) 15 MG tablet TAKE 1 TABLET (15 MG TOTAL) BY MOUTH 2 (TWO) TIMES DAILY. 60 tablet 3  . clopidogrel (PLAVIX) 75 MG tablet TAKE 1 TABLET BY MOUTH EVERY DAY 90 tablet 1  . dicyclomine (BENTYL) 20 MG tablet Take 1 tablet by mouth 4 (four) times daily.     Marland Kitchen estradiol (ESTRACE) 2 MG tablet Take 2 mg by mouth daily.  11  . FOLIC ACID  PO Folic Acid 50mg -Take 1 tablet by mouth daily.    . furosemide (LASIX) 40 MG tablet TAKE 1 TABLET (40 MG TOTAL) BY MOUTH 2 (TWO) TIMES DAILY. 180 tablet 1  . lactulose, encephalopathy, (CHRONULAC) 10 GM/15ML SOLN TAKE 30 MLS (20 G TOTAL) BY MOUTH EVERY 6 (SIX) HOURS AS  NEEDED. 1892 mL 1  . LINZESS 145 MCG CAPS capsule TAKE ONE CAPSULE BY MOUTH WITH FIRST MEAL OF THE DAY  3  . Multiple Vitamin (MULTIVITAMIN) tablet Take 1 tablet by mouth daily.    . nebivolol (BYSTOLIC) 5 MG tablet Take by mouth.    . ondansetron (ZOFRAN) 4 MG tablet Take 1 tablet (4 mg total) by mouth every 8 (eight) hours as needed for nausea or vomiting. 20 tablet 0  . OVER THE COUNTER MEDICATION Omega-3 Fatty acids-Vitamin E (fish oil) 1 capsule by mouth daily.    . potassium chloride (K-DUR) 10 MEQ tablet Take 2 tablets (20 mEq total) by mouth 3 (three) times daily. 180 tablet 1  . potassium chloride (K-DUR,KLOR-CON) 10 MEQ tablet Take 2 tablets by mouth 3 times daily. <PLEASE MAKE APPOINTMENT FOR REFILLS> 180 tablet 0  . SYNTHROID 112 MCG tablet TAKE 1 TABLET BY MOUTH BEFORE BREAKFAST 90 tablet 1  . vitamin D, CHOLECALCIFEROL, 400 UNITS tablet Take 400 Units by mouth 2 (two) times daily.      No current facility-administered medications on file prior to visit.     Review of Systems:  As per HPI- otherwise negative.   Physical Examination: Vitals:   10/12/16 1246 10/12/16 1303  BP: (!) 146/83 118/80  Pulse:    Temp:     Vitals:   10/12/16 1238  Weight: 140 lb (63.5 kg)  Height: 5\' 3"  (1.6 m)   Body mass index is 24.8 kg/m. Ideal Body Weight: Weight in (lb) to have BMI = 25: 140.8  GEN: WDWN, NAD, Non-toxic, A & O x 3, normal weight, looks well HEENT: Atraumatic, Normocephalic. Neck supple. No masses, No LAD. Ears and Nose: No external deformity. CV: RRR, No M/G/R. No JVD. No thrill. No extra heart sounds. PULM: CTA B, no wheezes, crackles, rhonchi. No retractions. No resp. distress. No accessory muscle  use. ABD: S, NT, ND, +BS. No rebound. No HSM.  She continues to have epigastric and LUQ abdominal pain to palpation EXTR: No c/c/e NEURO Normal gait.  PSYCH: Normally interactive. Conversant. Not depressed or anxious appearing.  Calm demeanor.    Assessment and Plan: Left sided abdominal pain  Here today with persistent left sided abd pain for several months (or longer- some notes state that she has had this since about 2010). So far we have not been able to find a cause for her sx.  She had has labs, a CT, and seen GI and vascular surgery for question of celiac and mesenteric artery changes I have requested her previous GI records and will review them when they come in Her recent cardiac history appears reassuring with a clean cath 3 years ago.   Will plan to contact pending pending my receipt of her GI records  Signed Lamar Blinks, MD

## 2016-10-12 ENCOUNTER — Encounter: Payer: Self-pay | Admitting: Family Medicine

## 2016-10-12 ENCOUNTER — Ambulatory Visit (INDEPENDENT_AMBULATORY_CARE_PROVIDER_SITE_OTHER): Payer: Medicare Other | Admitting: Family Medicine

## 2016-10-12 VITALS — BP 118/80 | HR 76 | Temp 98.0°F | Ht 63.0 in | Wt 140.0 lb

## 2016-10-12 DIAGNOSIS — R109 Unspecified abdominal pain: Secondary | ICD-10-CM

## 2016-10-12 NOTE — Patient Instructions (Addendum)
I am going to request your GI records from [facility].  Also, let me look through your recent cardiac history and try to figure out our next move.  Please let me know if any change in the meantime!

## 2016-10-15 ENCOUNTER — Telehealth: Payer: Self-pay | Admitting: Family Medicine

## 2016-10-15 DIAGNOSIS — R232 Flushing: Secondary | ICD-10-CM | POA: Diagnosis not present

## 2016-10-15 DIAGNOSIS — Z130 Encounter for screening for diseases of the blood and blood-forming organs and certain disorders involving the immune mechanism: Secondary | ICD-10-CM | POA: Diagnosis not present

## 2016-10-15 DIAGNOSIS — Z1329 Encounter for screening for other suspected endocrine disorder: Secondary | ICD-10-CM | POA: Diagnosis not present

## 2016-10-15 DIAGNOSIS — Z780 Asymptomatic menopausal state: Secondary | ICD-10-CM | POA: Diagnosis not present

## 2016-10-15 NOTE — Telephone Encounter (Signed)
Called and LMOM- recent cardiac evaluation was reassuring.  Will call her once I get her GI records.  If she does not hear from me please call

## 2016-10-19 ENCOUNTER — Other Ambulatory Visit: Payer: Self-pay | Admitting: Family Medicine

## 2016-10-19 DIAGNOSIS — E876 Hypokalemia: Secondary | ICD-10-CM

## 2016-10-19 MED ORDER — POTASSIUM CHLORIDE CRYS ER 20 MEQ PO TBCR: 20.0000 meq | EXTENDED_RELEASE_TABLET | Freq: Two times a day (BID) | ORAL | 2 refills | Status: DC

## 2016-10-19 NOTE — Telephone Encounter (Signed)
Pt called in to request a refill on potassium chloride 20MG  Rx. Pt says that she received refill from pharmacy but it was for 10 MG and should have been for 20. Informed pt that Dr. Claiborne Billings refilled. Pt says that she havent seen provider in years.   Pt says that because medication was the incorrect dosage she hasn't been taking it.    Please assist further.   [id number] [place], [place] - [address] AT [address]

## 2016-10-19 NOTE — Telephone Encounter (Signed)
Called her back- she is taking 20 meq BID Will refill this for her Most recent potassium on 5/30 was slightly low at 3.4 She is on chronic lasix and her K is always low

## 2016-10-19 NOTE — Telephone Encounter (Signed)
Patient calling again, request call back

## 2016-10-21 ENCOUNTER — Other Ambulatory Visit: Payer: Self-pay | Admitting: Family Medicine

## 2016-10-26 ENCOUNTER — Other Ambulatory Visit: Payer: Self-pay | Admitting: Family Medicine

## 2016-10-28 ENCOUNTER — Other Ambulatory Visit: Payer: Self-pay | Admitting: Family Medicine

## 2016-10-29 ENCOUNTER — Telehealth: Payer: Self-pay | Admitting: *Deleted

## 2016-10-29 NOTE — Telephone Encounter (Signed)
Received Medical records from [place]; forwarded to provider/SLS 07/26

## 2016-10-30 ENCOUNTER — Ambulatory Visit (INDEPENDENT_AMBULATORY_CARE_PROVIDER_SITE_OTHER): Payer: No Typology Code available for payment source | Admitting: Family

## 2016-10-30 VITALS — BP 114/55 | HR 62 | Temp 98.2°F | Ht 63.0 in | Wt 138.0 lb

## 2016-10-30 DIAGNOSIS — J069 Acute upper respiratory infection, unspecified: Secondary | ICD-10-CM | POA: Diagnosis not present

## 2016-10-30 MED ORDER — FLUTICASONE PROPIONATE 50 MCG/ACT NA SUSP: 2.0000 | Freq: Every day | NASAL | 0 refills | Status: DC

## 2016-10-30 NOTE — Progress Notes (Signed)
Subjective:    Patient ID: Alexandria [name], female    DOB: 1945-07-01, 71 y.o.   MRN: [id number]  HPI  Patient is a 71 year old female who presents today with chief complaint of sinus pressure and headache. Reports that symptoms began yesterday. Past medical history is significant for migraines, hypothyroidism, diverticulitis, DVT, and TIA.  Reports that she took vit C and mucinex yesterday without improvement. Denies fever.  Ears are "stopped".  She denies sick contacts.  + cough.  Reports cough began yesterday as well.   Review of Systems See HPI  Past Medical History:  Diagnosis Date  . Arthritis   . Asthmatic bronchitis with acute exacerbation 02/06/2015  . Bronchial pneumonia   . Colon polyp   . Diverticulitis   . DVT (deep venous thrombosis) ([place])    lower extremity  . H/O cardiac catheterization 2004   Normal coronary arteries  . Heart murmur   . History of stress test 05/21/2011  . Hx of echocardiogram 01/27/2010   Normal Ef 55% the transmitral spectral doppler flow pattern is normal for age. the left ventricular wall motion is normal  . Migraines   . Thyroid disease   . TIA (transient ischemic attack)    8 years ago     Social History   Social History  . Marital status: Married    Spouse name: [name]  . Number of children: 3  . Years of education: [employer]   Occupational History  . DISABLED Unemployed   Social History Main Topics  . Smoking status: Former Smoker    Years: 2.00    Quit date: 04/06/1969  . Smokeless tobacco: Never Used     Comment: socially smoked x 2 years  . Alcohol use No  . Drug use: No  . Sexual activity: Not on file   Other Topics Concern  . Not on file   Social History Narrative   Lives with husband [name]   Caffeine use: coffee (2 cups per day)   Mostly right-handed    Past Surgical History:  Procedure Laterality Date  . BREAST BIOPSY     Isaiah Blakes  . CARDIAC CATHETERIZATION     10/2013  . LEFT HEART  CATHETERIZATION WITH CORONARY ANGIOGRAM N/A 10/25/2013   Procedure: LEFT HEART CATHETERIZATION WITH CORONARY ANGIOGRAM;  Surgeon: Troy Sine, MD;  Location: [facility] CATH LAB;  Service: Cardiovascular;  Laterality: N/A;  . NECK SURGERY     x's 2  . TOTAL KNEE ARTHROPLASTY     Bilateral x's 2  . VAGINAL HYSTERECTOMY  10/17/1998   [facility]    Family History  Problem Relation Age of Onset  . Arthritis Unknown   . Colon cancer Father   . Prostate cancer Father   . HIV Brother        88  . Kidney cancer Brother   . Colon cancer Brother   . Lung cancer Brother   . Other Brother        Mouth Cancer  . Ovarian cancer Mother   . Uterine cancer Mother   . Heart disease Maternal Grandmother   . Stroke Maternal Grandmother   . Hypertension Maternal Grandmother   . Diabetes Maternal Grandmother     Allergies  Allergen Reactions  . Prednisone Other (See Comments)    Changed personality   . Atrovent Hfa [Ipratropium Bromide Hfa] Other (See Comments)    Pt could not sleep  . Cheratussin Ac [Guaifenesin-Codeine]   . Hydrocodone Itching  .  Influenza Vaccines Other (See Comments)    Pt reports heart attack after last flu shot  . Motrin [Ibuprofen] Other (See Comments)    "Gives false reading in blood"    Current Outpatient Prescriptions on File Prior to Visit  Medication Sig Dispense Refill  . Ascorbic Acid (VITAMIN C PO) Vitamin C 500mg -Take 2-4 capsules by mouth daily.    . Biotin (RA BIOTIN) 1000 MCG tablet Take 1,000 mcg by mouth 2 (two) times daily.     Marland Kitchen BREWERS YEAST PO Take 2 tablets by mouth 2 (two) times daily.     . busPIRone (BUSPAR) 15 MG tablet TAKE 1 TABLET (15 MG TOTAL) BY MOUTH 2 (TWO) TIMES DAILY. 60 tablet 3  . clopidogrel (PLAVIX) 75 MG tablet TAKE 1 TABLET BY MOUTH EVERY DAY 90 tablet 1  . dicyclomine (BENTYL) 20 MG tablet Take 1 tablet by mouth 4 (four) times daily.     Marland Kitchen estradiol (ESTRACE) 2 MG tablet Take 2 mg by mouth daily.  11  . FOLIC ACID PO Folic Acid  50mg -Take 1 tablet by mouth daily.    . furosemide (LASIX) 40 MG tablet TAKE 1 TABLET (40 MG TOTAL) BY MOUTH 2 (TWO) TIMES DAILY. 180 tablet 1  . lactulose, encephalopathy, (CHRONULAC) 10 GM/15ML SOLN TAKE 30 MLS (20 G TOTAL) BY MOUTH EVERY 6 (SIX) HOURS AS NEEDED. 1892 mL 1  . LINZESS 145 MCG CAPS capsule TAKE ONE CAPSULE BY MOUTH WITH FIRST MEAL OF THE DAY  3  . Multiple Vitamin (MULTIVITAMIN) tablet Take 1 tablet by mouth daily.    . nebivolol (BYSTOLIC) 5 MG tablet Take by mouth.    . ondansetron (ZOFRAN) 4 MG tablet Take 1 tablet (4 mg total) by mouth every 8 (eight) hours as needed for nausea or vomiting. 20 tablet 0  . OVER THE COUNTER MEDICATION Omega-3 Fatty acids-Vitamin E (fish oil) 1 capsule by mouth daily.    . potassium chloride SA (K-DUR,KLOR-CON) 20 MEQ tablet Take 1 tablet (20 mEq total) by mouth 2 (two) times daily. 180 tablet 2  . SYNTHROID 112 MCG tablet TAKE 1 TABLET BY MOUTH BEFORE BREAKFAST 90 tablet 1  . vitamin D, CHOLECALCIFEROL, 400 UNITS tablet Take 400 Units by mouth 2 (two) times daily.      No current facility-administered medications on file prior to visit.     BP (!) 114/55   Pulse 62   Temp 98.2 F (36.8 C) (Oral)   Ht 5\' 3"  (1.6 m)   Wt 138 lb (62.6 kg)   SpO2 99%   BMI 24.45 kg/m       Objective:   Physical Exam  Constitutional: She is oriented to person, place, and time. She appears well-developed and well-nourished.  HENT:  Right Ear: Tympanic membrane and ear canal normal.  Left Ear: Tympanic membrane and ear canal normal.  Nose: Right sinus exhibits maxillary sinus tenderness and frontal sinus tenderness. Left sinus exhibits maxillary sinus tenderness and frontal sinus tenderness.  Mouth/Throat: No oropharyngeal exudate, posterior oropharyngeal edema or posterior oropharyngeal erythema.  Cardiovascular: Normal rate, regular rhythm and normal heart sounds.   No murmur heard. Pulmonary/Chest: Effort normal and breath sounds normal. No  respiratory distress. She has no wheezes.  Musculoskeletal: She exhibits no edema.  Neurological: She is alert and oriented to person, place, and time.  Skin: Skin is warm and dry.  Psychiatric: She has a normal mood and affect. Her behavior is normal. Judgment and thought content normal.  Assessment & Plan:  Upper respiratory infection-symptoms have been present for 1 day. She is afebrile. Lung exam is normal. I have advised her on supportive measures as follows:  You may use tylenol as needed for pain. Add clartin 10mg  once daily and flonase 2 sprays each nostril once daily. You may also use mucinex as needed. Call if new/worsening symptoms, fever, or if symptoms are not resolved in 1 week.

## 2016-10-30 NOTE — Patient Instructions (Signed)
You may use tylenol as needed for pain. Add clartin 10mg  once daily and flonase 2 sprays each nostril once daily. You may also use mucinex as needed. Call if new/worsening symptoms, fever, or if symptoms are not resolved in 1 week.

## 2016-10-31 DIAGNOSIS — J0100 Acute maxillary sinusitis, unspecified: Secondary | ICD-10-CM | POA: Diagnosis not present

## 2016-11-05 ENCOUNTER — Other Ambulatory Visit: Payer: Self-pay | Admitting: Cardiovascular Disease

## 2016-11-10 ENCOUNTER — Telehealth: Payer: Self-pay | Admitting: Family Medicine

## 2016-11-10 NOTE — Telephone Encounter (Signed)
Received records from [place]- will abstract/ scan

## 2016-11-24 ENCOUNTER — Telehealth: Payer: Self-pay | Admitting: Family Medicine

## 2016-11-24 NOTE — Telephone Encounter (Signed)
°  Relation to EU:MPNT Call back [place]:  Reason for call:  Patient requesting referral to Dr. Melvyn Novas for upper respiratory concerns, please advise

## 2016-11-25 NOTE — Telephone Encounter (Signed)
Patient checking on the status of message below, please advise °

## 2016-11-25 NOTE — Telephone Encounter (Signed)
Spoke with pt and advised her that she needs evaluation with PCP for workup / referral. Scheduled appt for 11/26/16 at 4pm with Dr Lorelei Pont. Pt is agreeable.

## 2016-11-25 NOTE — Telephone Encounter (Signed)
Patient described "hard time breathing" "out of breath"  "tired quickly", patient states not sure if it was Dr. Melvyn Novas office who conducted the "flutter" test and she was seen for bronchial pneumonia but the specialist advised it may be her allergies therefore further test can be conducted,please advise

## 2016-11-25 NOTE — Telephone Encounter (Signed)
Left message for pt to return my call. We need to know specific concerns that pt has and has she seen Dr Lorelei Pont for this before. IF she has not been seen by PCP for her concerns then she needs to make appt with her first.

## 2016-11-26 ENCOUNTER — Encounter: Payer: Self-pay | Admitting: Family Medicine

## 2016-11-26 ENCOUNTER — Ambulatory Visit (INDEPENDENT_AMBULATORY_CARE_PROVIDER_SITE_OTHER): Payer: Medicare Other | Admitting: Family Medicine

## 2016-11-26 VITALS — BP 125/70 | HR 75 | Temp 98.7°F | Resp 20 | Ht 63.0 in | Wt 143.0 lb

## 2016-11-26 DIAGNOSIS — R0602 Shortness of breath: Secondary | ICD-10-CM | POA: Diagnosis not present

## 2016-11-26 NOTE — Progress Notes (Signed)
[place] at [facility] [address] [place], [place] [address] [phone]  Date:  11/26/2016   Name:  Alexandria [name]   DOB:  [birth year]   MRN:  [id number]  PCP:  Darreld Mclean, MD    Chief Complaint: No chief complaint on file.   History of Present Illness:  Alexandria [name] is a 71 y.o. very pleasant female patient who presents with the following:  She is seeing Dr. Melvyn Novas on Monday for an appt to discuss her breathing.   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 Complicated migraine 24/26/8341  . Insomnia 11/18/2015  . Chest pain at rest 05/20/2015  . RBBB 05/20/2015  . Abnormal CXR 05/20/2015  . History of MI (myocardial infarction) 05/08/2015  . Bruising 05/08/2015  . Upper airway cough syndrome 02/14/2015  . Asthmatic bronchitis with acute exacerbation 02/06/2015  . Hypothyroidism 10/25/2014  . Fatigue 10/25/2014  . 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 Chronic venous insufficiency 10/24/2014  . Varicose veins of leg with complications 96/22/2979  . Degeneration of intervertebral disc of cervical region 06/29/2014  . Palpitations 11/08/2013  . SOB (shortness of breath) 10/03/2013  . DOE (dyspnea on exertion) 10/03/2013  . Edema of  both legs 10/03/2013  . Hair loss 11/25/2012  . Diverticulitis 07/14/2012  . Hammer toe 06/13/2012  . Recurrent abdominal pain 04/17/2012  . Hypoglycemia 01/06/2012    Past Medical History:  Diagnosis Date  . Arthritis   . Asthmatic bronchitis with acute exacerbation 02/06/2015  . Bronchial pneumonia   . Colon polyp   . Diverticulitis   . DVT (deep venous thrombosis) ([place])    lower extremity  . H/O cardiac catheterization 2004   Normal coronary arteries  . Heart murmur   . History of stress test 05/21/2011  . Hx of echocardiogram 01/27/2010   Normal Ef 55% the transmitral spectral doppler flow pattern is normal for age. the left ventricular wall motion is normal  . Migraines   . Thyroid disease   . TIA (transient ischemic attack)    8 years ago    Past Surgical History:  Procedure Laterality Date  . BREAST BIOPSY     Isaiah Blakes  . CARDIAC CATHETERIZATION     10/2013  . LEFT HEART CATHETERIZATION WITH CORONARY ANGIOGRAM N/A 10/25/2013   Procedure: LEFT HEART CATHETERIZATION WITH CORONARY ANGIOGRAM;  Surgeon: Troy Sine, MD;  Location: [facility] CATH LAB;  Service: Cardiovascular;  Laterality: N/A;  . NECK SURGERY     x's 2  . TOTAL KNEE ARTHROPLASTY     Bilateral x's 2  . VAGINAL HYSTERECTOMY  10/17/1998  [place]    Social History  Substance Use Topics  . Smoking status: Former Smoker    Years: 2.00    Quit date: 04/06/1969  . Smokeless tobacco: Never Used     Comment: socially smoked x 2 years  . Alcohol use No    Family History  Problem Relation Age of Onset  . Arthritis Unknown   . Colon cancer Father   . Prostate cancer Father   . HIV Brother        84  . Kidney cancer Brother   . Colon cancer Brother   . Lung cancer Brother   . Other Brother        Mouth Cancer  . Ovarian cancer Mother   . Uterine cancer Mother   . Heart disease Maternal Grandmother   . Stroke Maternal Grandmother   . Hypertension Maternal Grandmother   . Diabetes Maternal  Grandmother     Allergies  Allergen Reactions  . Prednisone Other (See Comments)    Changed personality   . Atrovent Hfa [Ipratropium Bromide Hfa] Other (See Comments)    Pt could not sleep  . Cheratussin Ac [Guaifenesin-Codeine]   . Hydrocodone Itching  . Influenza Vaccines Other (See Comments)    Pt reports heart attack after last flu shot  . Motrin [Ibuprofen] Other (See Comments)    "Gives false reading in blood"    Medication list has been reviewed and updated.  Current Outpatient Prescriptions on File Prior to Visit  Medication Sig Dispense Refill  . Ascorbic Acid (VITAMIN C PO) Vitamin C 500mg -Take 2-4 capsules by mouth daily.    . Biotin (RA BIOTIN) 1000 MCG tablet Take 1,000 mcg by mouth 2 (two) times daily.     Marland Kitchen BREWERS YEAST PO Take 2 tablets by mouth 2 (two) times daily.     . busPIRone (BUSPAR) 15 MG tablet TAKE 1 TABLET (15 MG TOTAL) BY MOUTH 2 (TWO) TIMES DAILY. 60 tablet 3  . clopidogrel (PLAVIX) 75 MG tablet TAKE 1 TABLET BY MOUTH EVERY DAY 90 tablet 1  . dicyclomine (BENTYL) 20 MG tablet Take 1 tablet by mouth 4 (four) times daily.     Marland Kitchen estradiol (ESTRACE) 2 MG tablet Take 2 mg by mouth daily.  11  . FOLIC ACID PO Folic Acid 50mg -Take 1 tablet by mouth daily.    . furosemide (LASIX) 40 MG tablet TAKE 1 TABLET (40 MG TOTAL) BY MOUTH 2 (TWO) TIMES DAILY. 180 tablet 1  . lactulose, encephalopathy, (CHRONULAC) 10 GM/15ML SOLN TAKE 30 MLS (20 G TOTAL) BY MOUTH EVERY 6 (SIX) HOURS AS NEEDED. 1892 mL 1  . LINZESS 145 MCG CAPS capsule TAKE ONE CAPSULE BY MOUTH WITH FIRST MEAL OF THE DAY  3  . Multiple Vitamin (MULTIVITAMIN) tablet Take 1 tablet by mouth daily.    Marland Kitchen OVER THE COUNTER MEDICATION Omega-3 Fatty acids-Vitamin E (fish oil) 1 capsule by mouth daily.    . potassium chloride SA (K-DUR,KLOR-CON) 20 MEQ tablet Take 1 tablet (20 mEq total) by mouth 2 (two) times daily. 180 tablet 2  . SYNTHROID 112 MCG tablet TAKE 1 TABLET BY MOUTH BEFORE BREAKFAST 90 tablet 1  .  vitamin D, CHOLECALCIFEROL, 400 UNITS tablet Take 400 Units by mouth 2 (two) times daily.      No current facility-administered medications on file prior to visit.     Review of Systems:  As per HPI- otherwise negative.   Physical Examination: Vitals:   11/26/16 1543  BP: 125/70  Pulse: 75  Resp: 20  Temp: 98.7 F (37.1 C)  SpO2: 95%   Vitals:   11/26/16 1543  Weight: 143 lb (64.9 kg)  Height: 5\' 3"  (1.6 m)   Body mass index is 25.33 kg/m. Ideal Body Weight: Weight in (lb) to have BMI = 25: 140.8  GEN: WDWN, NAD, Non-toxic, A & O x 3 HEENT: Atraumatic, Normocephalic. Neck supple. No masses, No LAD. Ears and Nose: No external deformity. CV: RRR, No M/G/R. No JVD. No thrill. No extra heart sounds. PULM: CTA B, no wheezes, crackles, rhonchi. No retractions. No resp. distress. No accessory muscle use. ABD: S, NT, ND, +BS. No rebound. No HSM. EXTR: No c/c/e NEURO Normal gait.  PSYCH: Normally interactive. Conversant. Not depressed or anxious appearing.  Calm demeanor.  Looks well today, no distress   Assessment and Plan: Shortness of breath  Here today with shortness of breath for 6 months. We had not clearly understood what she needed when she called- she is already a pt of Dr. Melvyn Novas and will see him on Monday. She does not need anything from [place] today, sx are stable and she is in no distress.  However she will alert [place] if she needs anything this weekend at all  [place], MD

## 2016-11-30 ENCOUNTER — Ambulatory Visit (INDEPENDENT_AMBULATORY_CARE_PROVIDER_SITE_OTHER)
Admission: RE | Admit: 2016-11-30 | Discharge: 2016-11-30 | Disposition: A | Discharge status: Home / Self Care | Payer: Medicare Other | Source: Ambulatory Visit | Attending: Internal Medicine | Admitting: Internal Medicine

## 2016-11-30 ENCOUNTER — Ambulatory Visit (INDEPENDENT_AMBULATORY_CARE_PROVIDER_SITE_OTHER): Payer: Medicare Other | Admitting: Internal Medicine

## 2016-11-30 ENCOUNTER — Encounter: Payer: Self-pay | Admitting: Internal Medicine

## 2016-11-30 ENCOUNTER — Other Ambulatory Visit (INDEPENDENT_AMBULATORY_CARE_PROVIDER_SITE_OTHER): Payer: Medicare Other

## 2016-11-30 VITALS — BP 118/80 | HR 60 | Ht 64.0 in | Wt 142.0 lb

## 2016-11-30 DIAGNOSIS — R058 Other specified cough: Secondary | ICD-10-CM

## 2016-11-30 DIAGNOSIS — R05 Cough: Secondary | ICD-10-CM | POA: Diagnosis not present

## 2016-11-30 DIAGNOSIS — R0602 Shortness of breath: Secondary | ICD-10-CM | POA: Diagnosis not present

## 2016-11-30 LAB — CBC WITH DIFFERENTIAL/PLATELET
BASOS PCT: 1.5 % (ref 0.0–3.0)
Basophils Absolute: 0.1 10*3/uL (ref 0.0–0.1)
EOS PCT: 6.3 % — AB (ref 0.0–5.0)
Eosinophils Absolute: 0.3 10*3/uL (ref 0.0–0.7)
HEMATOCRIT: 42.6 % (ref 36.0–46.0)
Hemoglobin: 14.4 g/dL (ref 12.0–15.0)
LYMPHS ABS: 1.6 10*3/uL (ref 0.7–4.0)
Lymphocytes Relative: 34.7 % (ref 12.0–46.0)
MCHC: 33.7 g/dL (ref 30.0–36.0)
MCV: 97.8 fl (ref 78.0–100.0)
MONO ABS: 0.5 10*3/uL (ref 0.1–1.0)
Monocytes Relative: 11.3 % (ref 3.0–12.0)
Neutro Abs: 2.1 10*3/uL (ref 1.4–7.7)
Neutrophils Relative %: 46.2 % (ref 43.0–77.0)
PLATELETS: 208 10*3/uL (ref 150.0–400.0)
RBC: 4.36 Mil/uL (ref 3.87–5.11)
RDW: 13.2 % (ref 11.5–15.5)
WBC: 4.6 10*3/uL (ref 4.0–10.5)

## 2016-11-30 LAB — NITRIC OXIDE: Nitric Oxide: 40

## 2016-11-30 MED ORDER — METHYLPREDNISOLONE ACETATE 80 MG/ML IJ SUSP
120.0000 mg | Freq: Once | INTRAMUSCULAR | Status: AC
  Administered 2016-11-30: 120 mg via INTRAMUSCULAR

## 2016-11-30 NOTE — Progress Notes (Signed)
LMTCB

## 2016-11-30 NOTE — Progress Notes (Signed)
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    History of Present Illness  02/14/2015 1st [facility] office visit/ Raina Sole   Chief Complaint  Patient presents with  . Pulmonary Consult    Pt c/o dry cough with wheeze x 2weeks. Pt has been using albuterol HFA with some symptom relief.   some better after nebulizer in office and prednisone rx but only maybe 25% at most  Worse with voice use / not productive / only sob when coughing/ not present at hs  No better on qvar  rec Depomedrol 120 mg IM today  For cough > use robitussin with codeine and the flutter valve as much as you can  For breathing > ok to use albuterol up to 2 pffs and leave off qvar for now Pantoprazole (protonix) 40 mg   Take  30-60 min before first meal of the day and Pepcid (famotidine)  20 mg one @  bedtime until return to office - this is the best way to tell whether stomach acid is contributing to your problem.  GERD (REFLUX) diet    06/11/2015  f/u ov/Cassady Stanczak re: recurrent cough/ did not activate any part of the above contingency  Chief Complaint  Patient presents with  . Follow-up    Pt states that her cough has improved some. She is hoarse and has to clear her throat often.   onset x 10 days cough worse singing, worse daytime, not worse sleeping  rec At the onset of cough for any reason I recommend For cough >>  Delsym cough syrup  For drainage > For drainage / throat tickle try take CHLORPHENIRAMINE  4 mg - take one every 4 hours as needed - available over the counter- may cause drowsiness so start with just a bedtime dose or two and see how you tolerate it before trying in  daytime   Try prilosec otc 20mg   Take 30-60 min before first meal of the day and Pepcid ac (famotidine) 20 mg one @  bedtime until cough is completely gone for at least a week without the need for cough suppression GERD  For any cough lasting more than 3 weeks please return to office       11/30/2016 acute extended ov/Edwen Mclester re: recurrent cough ? Asthma component  Chief Complaint  Patient presents with  . Acute Visit    Pt c/o SOB and wheezing off and on for the past month.  She states that she gets SOB walking up 1 flight of stairs. She states when she is singing she has noticed she is unable to hold a note for as long. She has been coughing some- notices when eating.   sleeps fine s cough/ sob  Husband hear's wheezing when  sitting still "but he's completely deaf"   Only sob with steps or singing esp high notes >  also loses her voice when flares  Did not start any of the above contingencies  occ coughs p meals but not aware of what foods trigger   No obvious day to day or daytime variability or assoc excess/  purulent sputum or mucus plugs or hemoptysis or cp or chest tightness, subjective wheeze or overt sinus or hb symptoms. No unusual exp hx or h/o childhood pna/ asthma or knowledge of premature birth.  Sleeping ok without nocturnal  or early am exacerbation  of respiratory  c/o's or need for noct saba. Also denies any obvious fluctuation of symptoms with weather or environmental changes or other aggravating or alleviating factors except as outlined above   Current Medications, Allergies, Complete Past Medical History, Past Surgical History, Family History, and Social History were reviewed in [employer] record.  ROS  The following are not active complaints unless bolded sore throat, dysphagia, dental problems, itching, sneezing,  nasal congestion or excess/ purulent secretions, ear ache,   fever, chills, sweats, unintended wt loss, classically pleuritic or  exertional cp,  orthopnea pnd or leg swelling, presyncope, palpitations, abdominal pain, anorexia, nausea, vomiting, diarrhea  or change in bowel or bladder habits, change in stools or urine, dysuria,hematuria,  rash, arthralgias, visual complaints, headache, numbness, weakness or ataxia or problems with walking or coordination,  change in mood/affect or memory.              Objective:   Physical Exam   amb  wf nad minimally hoarse, unusual affect   11/30/2016         142  06/11/2015          138  04/16/2015        138   03/05/2015     140   02/14/15 63.866 kg (140 lb 12.8 oz)  02/11/15 63.957 kg (141 lb)  02/06/15 63.107 kg (139 lb 2 oz)    Vital signs reviewed  - Note on arrival 02 sats  100% on RA      HEENT: nl dentition, turbinates, and oropharynx. Nl external ear canals without cough reflex   NECK :  without JVD/Nodes/TM/ nl carotid upstrokes bilaterally   LUNGS: no acc muscle use, clear to A and P bilaterally without cough on insp or exp maneuvers   CV:  RRR  no s3 or murmur or increase in P2, no edema   ABD:  soft and nontender with nl excursion in the supine position. No bruits or organomegaly, bowel sounds nl  MS:  warm without deformities, calf tenderness, cyanosis or clubbing  SKIN: warm and dry without lesions    NEURO:  alert, approp, no deficits   CXR PA and Lateral:   11/30/2016 :    I personally reviewed images and agree with radiology impression as follows:   No active cardiopulmonary disease   Also reviewed UGI 09/22/16 1. Somewhat patulous gastroesophageal junction with mild to moderate gastroesophageal reflux demonstrated. No hiatal hernia is seen. 2. Barium pill passes into the stomach without delay. 3. No abnormality of the stomach or duodenum is seen.   Labs ordered 11/30/2016    Allergy profile             Assessment:

## 2016-11-30 NOTE — Patient Instructions (Signed)
At the onset of cough for any reason I recommend  For cough >>  Delsym cough syrup  2 tsp every 12 hours   For drainage > For drainage / throat tickle try take CHLORPHENIRAMINE  4 mg - take one every 4 hours as needed - available over the counter- may cause drowsiness so start with just a bedtime dose or two and see how you tolerate it before trying in daytime    Try prilosec otc 20mg   Take 30-60 min before first meal of the day and Pepcid ac (famotidine) 20 mg one @  bedtime until cough is completely gone for at least a week without the need for cough suppression  GERD (REFLUX)  is an extremely common cause of respiratory symptoms just like yours , many times with no obvious heartburn at all.    It can be treated with medication, but also with lifestyle changes including elevation of the head of your bed (ideally with 6 inch  bed blocks),  Smoking cessation, avoidance of late meals, excessive alcohol, and avoid fatty foods, chocolate, peppermint, colas, red wine, and acidic juices such as orange juice.  NO MINT OR MENTHOL PRODUCTS SO NO COUGH DROPS   USE SUGARLESS CANDY INSTEAD (Jolley ranchers or Stover's or Life Savers) or even ice chips will also do - the key is to swallow to prevent all throat clearing. NO OIL BASED VITAMINS - use powdered substitutes.    Please remember to go to the lab and x-ray department downstairs in the basement  for your tests - we will call you with the results when they are available.     If not 100 % better in  2 weeks of above rx then call [facility] at [phone] to schedule Methacholine challenge testing

## 2016-12-01 ENCOUNTER — Telehealth: Payer: Self-pay | Admitting: Internal Medicine

## 2016-12-01 LAB — RESPIRATORY ALLERGY PROFILE REGION II ~~LOC~~
Allergen, A. alternata, m6: <0.1 kU/L
Allergen, D pternoyssinus,d7: <0.1 kU/L
Allergen, Mulberry, t76: <0.1 kU/L
Allergen, P. notatum, m1: <0.1 kU/L
Aspergillus fumigatus, m3: <0.1 kU/L
Cockroach: <0.1 kU/L
Common Ragweed: <0.1 kU/L
D. farinae: <0.1 kU/L
Elm IgE: <0.1 kU/L
IGE (IMMUNOGLOBULIN E), SERUM: 129 kU/L — AB (ref <115)
Johnson Grass: <0.1 kU/L
Pecan/Hickory Tree IgE: <0.1 kU/L
Rough Pigweed  IgE: <0.1 kU/L
Timothy Grass: <0.1 kU/L

## 2016-12-01 NOTE — Progress Notes (Signed)
LMTCB

## 2016-12-01 NOTE — Assessment & Plan Note (Signed)
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   These are the same pts (now being labeled as having "irritable larynx syndrome" by some cough centers) who not infrequently have a history of having failed to tolerate ace inhibitors,  dry powder inhalers or biphosphonates or report having atypical/extraesophageal reflux symptoms that don't respond to standard doses of PPI  and are easily confused as having aecopd or asthma flares by even experienced allergists/ pulmonologists (myself included).  Of the three most common causes of  Sub-acute or recurrent or chronic cough, only one (GERD)  can actually contribute to/ trigger  the other two (asthma and post nasal drip syndrome)  and perpetuate the cylce of cough.  While not intuitively obvious, many patients with chronic low grade reflux do not cough until there is a primary insult that disturbs the protective epithelial barrier and exposes sensitive nerve endings.   This is typically viral but can  be direct physical injury such as with an endotracheal tube.   The point is that once this occurs, it is difficult to eliminate the cycle  using anything but a maximally effective acid suppression regimen at least in the short run, accompanied by an appropriate diet to address non acid GERD and control / eliminate the cough itself for at least 3 days.   rec max rx for gerd / cyclical cough then MCT if dx in doubt   I had an extended discussion with the patient reviewing all relevant studies completed to date and  lasting 25 minutes of a 40  minute acute visit addressing recurrent refractory non-specific but potentially very serious   respiratory symptoms of uncertain and potentially multiple  etiologies.   Each maintenance medication was reviewed in detail including most importantly the difference between maintenance and prns and under what circumstances the prns are to be triggered using an action plan format that is not reflected in the computer generated alphabetically organized AVS.    Please see AVS for specific instructions unique to this office visit that I personally wrote and verbalized to the the pt in detail and then reviewed with pt  by my nurse highlighting any changes in therapy/plan of care  recommended at today's visit.

## 2016-12-01 NOTE — Telephone Encounter (Signed)
Tanda Rockers, MD  Rosana Berger, CMA        Call patient : Studies are unremarkable, no def allergy identified to respiratory antigens    Pt is aware of results and voiced her understanding. Nothing further needed.

## 2016-12-25 ENCOUNTER — Other Ambulatory Visit: Payer: Self-pay | Admitting: Family Medicine

## 2017-01-01 DIAGNOSIS — H16223 Keratoconjunctivitis sicca, not specified as Sjogren's, bilateral: Secondary | ICD-10-CM | POA: Diagnosis not present

## 2017-01-01 DIAGNOSIS — H524 Presbyopia: Secondary | ICD-10-CM | POA: Diagnosis not present

## 2017-01-01 DIAGNOSIS — H35363 Drusen (degenerative) of macula, bilateral: Secondary | ICD-10-CM | POA: Diagnosis not present

## 2017-01-01 DIAGNOSIS — H52223 Regular astigmatism, bilateral: Secondary | ICD-10-CM | POA: Diagnosis not present

## 2017-01-01 DIAGNOSIS — H2513 Age-related nuclear cataract, bilateral: Secondary | ICD-10-CM | POA: Diagnosis not present

## 2017-01-01 DIAGNOSIS — H25013 Cortical age-related cataract, bilateral: Secondary | ICD-10-CM | POA: Diagnosis not present

## 2017-01-28 DIAGNOSIS — H25012 Cortical age-related cataract, left eye: Secondary | ICD-10-CM | POA: Diagnosis not present

## 2017-01-28 DIAGNOSIS — H2512 Age-related nuclear cataract, left eye: Secondary | ICD-10-CM | POA: Diagnosis not present

## 2017-02-08 DIAGNOSIS — H25812 Combined forms of age-related cataract, left eye: Secondary | ICD-10-CM | POA: Diagnosis not present

## 2017-02-08 DIAGNOSIS — H2512 Age-related nuclear cataract, left eye: Secondary | ICD-10-CM | POA: Diagnosis not present

## 2017-02-08 DIAGNOSIS — H25012 Cortical age-related cataract, left eye: Secondary | ICD-10-CM | POA: Diagnosis not present

## 2017-03-10 DIAGNOSIS — H43812 Vitreous degeneration, left eye: Secondary | ICD-10-CM | POA: Diagnosis not present

## 2017-03-25 ENCOUNTER — Other Ambulatory Visit: Payer: Self-pay | Admitting: Family Medicine

## 2017-03-27 ENCOUNTER — Other Ambulatory Visit: Payer: Self-pay | Admitting: Family Medicine

## 2017-04-01 DIAGNOSIS — T148XXA Other injury of unspecified body region, initial encounter: Secondary | ICD-10-CM | POA: Diagnosis not present

## 2017-04-01 DIAGNOSIS — L089 Local infection of the skin and subcutaneous tissue, unspecified: Secondary | ICD-10-CM | POA: Diagnosis not present

## 2017-04-08 ENCOUNTER — Ambulatory Visit (INDEPENDENT_AMBULATORY_CARE_PROVIDER_SITE_OTHER): Payer: Medicare Other | Admitting: Family Medicine

## 2017-04-08 ENCOUNTER — Encounter: Payer: Self-pay | Admitting: Family Medicine

## 2017-04-08 VITALS — BP 118/82 | HR 73 | Temp 98.3°F | Ht 64.0 in | Wt 147.0 lb

## 2017-04-08 DIAGNOSIS — T148XXA Other injury of unspecified body region, initial encounter: Secondary | ICD-10-CM

## 2017-04-08 DIAGNOSIS — E876 Hypokalemia: Secondary | ICD-10-CM | POA: Diagnosis not present

## 2017-04-08 DIAGNOSIS — E039 Hypothyroidism, unspecified: Secondary | ICD-10-CM

## 2017-04-08 LAB — TSH: TSH: 1.33 u[IU]/mL (ref 0.35–4.50)

## 2017-04-08 LAB — BASIC METABOLIC PANEL
BUN: 14 mg/dL (ref 6–23)
CALCIUM: 8.8 mg/dL (ref 8.4–10.5)
CO2: 33 meq/L — AB (ref 19–32)
Chloride: 101 mEq/L (ref 96–112)
Creatinine, Ser: 0.8 mg/dL (ref 0.40–1.20)
GFR: 74.87 mL/min (ref >60.00)
GLUCOSE: 85 mg/dL (ref 70–99)
POTASSIUM: 3.5 meq/L (ref 3.5–5.1)
SODIUM: 140 meq/L (ref 135–145)

## 2017-04-08 NOTE — Patient Instructions (Signed)
Things to look out for: increasing pain not relieved by ibuprofen/acetaminophen, fevers, spreading redness, drainage of pus, or foul odor.  Keep using Neosporin twice daily. Keep area clean and dry.

## 2017-04-08 NOTE — Progress Notes (Signed)
Chief Complaint  Patient presents with  . Leg Injury    cut     Alexandria [name] is a 72 y.o. female here for a skin complaint.  Duration: 2 weeks Location: LLE Pruritic? Yes Painful? No- was painful Drainage? No Other associated symptoms: Scabbing; redness much better Therapies tried thus far: Abx TID, thinks it might be amoxicillin, TAO  Hypothyroidism Patient presents for follow-up of hypothyroidism.  Reports compliance with medication. Current symptoms include: denies fatigue, weight changes, heat/cold intolerance, bowel/skin changes or CVS symptoms She believes her dose should be unchanged   ROS:  Const: No fevers Skin: As noted in HPI  Past Medical History:  Diagnosis Date  . Arthritis   . Asthmatic bronchitis with acute exacerbation 02/06/2015  . Bronchial pneumonia   . Colon polyp   . Diverticulitis   . DVT (deep venous thrombosis) ([place])    lower extremity  . H/O cardiac catheterization 2004   Normal coronary arteries  . Heart murmur   . History of stress test 05/21/2011  . Hx of echocardiogram 01/27/2010   Normal Ef 55% the transmitral spectral doppler flow pattern is normal for age. the left ventricular wall motion is normal  . Migraines   . Thyroid disease   . TIA (transient ischemic attack)    8 years ago    BP 118/82 (BP Location: Left Arm, Patient Position: Sitting, Cuff Size: Normal)   Pulse 73   Temp 98.3 F (36.8 C) (Oral)   Ht 5\' 4"  (1.626 m)   Wt 147 lb (66.7 kg)   SpO2 95%   BMI 25.23 kg/m  Gen: awake, alert, appearing stated age Lungs: No accessory muscle use Skin: See below. No drainage, erythema, fluctuance Psych: Age appropriate judgment and insight  Media Information     Skin abrasion  Hypothyroidism, unspecified type - Plan: TSH  Hypokalemia - Plan: Basic metabolic panel  Orders as above. Keep c/d, tao bid, warning s/s's written after being verbalized.  Ck labs. F/u w reg pcp as orig scheduled. The patient voiced  understanding and agreement to the plan.  [place], DO 04/08/17 11:01 AM

## 2017-04-08 NOTE — Progress Notes (Signed)
Pre visit review using our clinic review tool, if applicable. No additional management support is needed unless otherwise documented below in the visit note. 

## 2017-04-13 DIAGNOSIS — Z961 Presence of intraocular lens: Secondary | ICD-10-CM | POA: Diagnosis not present

## 2017-04-13 DIAGNOSIS — H2513 Age-related nuclear cataract, bilateral: Secondary | ICD-10-CM | POA: Diagnosis not present

## 2017-04-13 DIAGNOSIS — H43812 Vitreous degeneration, left eye: Secondary | ICD-10-CM | POA: Diagnosis not present

## 2017-04-13 DIAGNOSIS — H53482 Generalized contraction of visual field, left eye: Secondary | ICD-10-CM | POA: Diagnosis not present

## 2017-04-13 DIAGNOSIS — H35363 Drusen (degenerative) of macula, bilateral: Secondary | ICD-10-CM | POA: Diagnosis not present

## 2017-04-19 ENCOUNTER — Encounter: Payer: Self-pay | Admitting: Family Medicine

## 2017-04-19 ENCOUNTER — Ambulatory Visit (INDEPENDENT_AMBULATORY_CARE_PROVIDER_SITE_OTHER): Payer: Medicare Other | Admitting: Family Medicine

## 2017-04-19 VITALS — BP 128/76 | HR 74 | Temp 98.2°F | Resp 16 | Ht 64.0 in | Wt 142.8 lb

## 2017-04-19 DIAGNOSIS — L089 Local infection of the skin and subcutaneous tissue, unspecified: Secondary | ICD-10-CM

## 2017-04-19 DIAGNOSIS — T148XXA Other injury of unspecified body region, initial encounter: Secondary | ICD-10-CM

## 2017-04-19 MED ORDER — CEFTRIAXONE SODIUM 1 G IJ SOLR
1.0000 g | Freq: Once | INTRAMUSCULAR | Status: AC
  Administered 2017-04-19: 1 g via INTRAMUSCULAR

## 2017-04-19 MED ORDER — DOXYCYCLINE HYCLATE 100 MG PO TABS: 100.0000 mg | ORAL_TABLET | Freq: Two times a day (BID) | ORAL | 0 refills | Status: DC

## 2017-04-19 NOTE — Progress Notes (Signed)
Patient ID: Alexandria [name], female   DOB: 05/25/1945, 72 y.o.   MRN: [id number]    Subjective:  I acted as a Education administrator for Dr. Carollee Herter.  Guerry Bruin, [place]   Patient ID: Alexandria [name], female    DOB: October 31, 1945, 72 y.o.   MRN: [id number]  Chief Complaint  Patient presents with  . Abrasion    lower left leg, before christmas    HPI Patient is in today for follow up abrasion -- she feels like its getting worse.  See last ov    Patient Care Team: Copland, Gay Filler, MD as PCP - General (Family Medicine) Maisie Fus, MD as Consulting Physician (Obstetrics and Gynecology)   Past Medical History:  Diagnosis Date  . Arthritis   . Asthmatic bronchitis with acute exacerbation 02/06/2015  . Bronchial pneumonia   . Colon polyp   . Diverticulitis   . DVT (deep venous thrombosis) ([place])    lower extremity  . H/O cardiac catheterization 2004   Normal coronary arteries  . Heart murmur   . History of stress test 05/21/2011  . Hx of echocardiogram 01/27/2010   Normal Ef 55% the transmitral spectral doppler flow pattern is normal for age. the left ventricular wall motion is normal  . Migraines   . Thyroid disease   . TIA (transient ischemic attack)    8 years ago    Past Surgical History:  Procedure Laterality Date  . BREAST BIOPSY     [facility] Bl[facility]  . CARDIAC CATHETERIZATION     10/2013  . LEFT HEART CATHETERIZATION WITH CORONARY ANGIOGRAM N/A 10/25/2013   Procedure: LEFT HEART CATHETERIZATION WITH CORONARY ANGIOGRAM;  Surgeon: Troy Sine, MD;  Location: [facility] CATH LAB;  Service: Cardiovascular;  Laterality: N/A;  . NECK SURGERY     x's 2  . TOTAL KNEE ARTHROPLASTY     Bilateral x's 2  . VAGINAL HYSTERECTOMY  10/17/1998   Dory Horn    Family History  Problem Relation Age of Onset  . Arthritis Unknown   . Colon cancer Father   . Prostate cancer Father   . HIV Brother        30  . Kidney cancer Brother   . Colon cancer Brother   . Lung cancer Brother   . Other  Brother        Mouth Cancer  . Ovarian cancer Mother   . Uterine cancer Mother   . Heart disease Maternal Grandmother   . Stroke Maternal Grandmother   . Hypertension Maternal Grandmother   . Diabetes Maternal Grandmother     Social History   Socioeconomic History  . Marital status: Married    Spouse name: [name]  . Number of children: 3  . Years of education: [employer]  . Highest education level: Not on file  Social Needs  . Financial resource strain: Not on file  . Food insecurity - worry: Not on file  . Food insecurity - inability: Not on file  . Transportation needs - medical: Not on file  . Transportation needs - non-medical: Not on file  Occupational History  . Occupation: DISABLED    Employer: UNEMPLOYED  Tobacco Use  . Smoking status: Former Smoker    Years: 2.00    Last attempt to quit: 04/06/1969    Years since quitting: 48.0  . Smokeless tobacco: Never Used  . Tobacco comment: socially smoked x 2 years  Substance and Sexual Activity  . Alcohol use: No    Alcohol/week: 0.0  oz  . Drug use: No  . Sexual activity: Not on file  Other Topics Concern  . Not on file  Social History Narrative   Lives with husband [name]   Caffeine use: coffee (2 cups per day)   Mostly right-handed    Outpatient Medications Prior to Visit  Medication Sig Dispense Refill  . Ascorbic Acid (VITAMIN C PO) Vitamin C 500mg -Take 2-4 capsules by mouth daily.    . Biotin (RA BIOTIN) 1000 MCG tablet Take 1,000 mcg by mouth 2 (two) times daily.     Marland Kitchen BREWERS YEAST PO Take 2 tablets by mouth 2 (two) times daily.     . busPIRone (BUSPAR) 15 MG tablet TAKE 1 TABLET (15 MG TOTAL) BY MOUTH 2 (TWO) TIMES DAILY. (Patient taking differently: Take 15 mg by mouth at bedtime as needed. ) 60 tablet 3  . clopidogrel (PLAVIX) 75 MG tablet TAKE 1 TABLET BY MOUTH EVERY DAY 90 tablet 1  . dicyclomine (BENTYL) 20 MG tablet TAKE 1 TABLET BY MOUTH 4 TIMES A DAY AS NEEDED 360 tablet 1  . estradiol (ESTRACE) 2 MG  tablet Take 2-3 mg by mouth daily.   11  . FOLIC ACID PO Folic Acid 50mg -Take 1 tablet by mouth daily.    . furosemide (LASIX) 40 MG tablet Take 1 tablet (40 mg total) by mouth 2 (two) times daily. 180 tablet 1  . lactulose, encephalopathy, (CHRONULAC) 10 GM/15ML SOLN TAKE 30 MLS (20 G TOTAL) BY MOUTH EVERY 6 (SIX) HOURS AS NEEDED. 1892 mL 1  . Multiple Vitamin (MULTIVITAMIN) tablet Take 1 tablet by mouth daily.    Marland Kitchen OVER THE COUNTER MEDICATION Omega-3 Fatty acids-Vitamin E (fish oil) 1 capsule by mouth daily.    . potassium chloride SA (K-DUR,KLOR-CON) 20 MEQ tablet Take 1 tablet (20 mEq total) by mouth 2 (two) times daily. 180 tablet 2  . SYNTHROID 112 MCG tablet TAKE 1 TABLET BY MOUTH EVERY DAY BEFORE BREAKFAST 90 tablet 1  . vitamin D, CHOLECALCIFEROL, 400 UNITS tablet Take 400 Units by mouth 2 (two) times daily.     Marland Kitchen dicyclomine (BENTYL) 20 MG tablet Take 1 tablet by mouth 4 (four) times daily.     Marland Kitchen LINZESS 145 MCG CAPS capsule every other day  3   No facility-administered medications prior to visit.     Allergies  Allergen Reactions  . Prednisone Other (See Comments)    Changed personality   . Atrovent Hfa [Ipratropium Bromide Hfa] Other (See Comments)    Pt could not sleep  . Cheratussin Ac [Guaifenesin-Codeine]   . Hydrocodone Itching  . Influenza Vaccines Other (See Comments)    Pt reports heart attack after last flu shot  . Motrin [Ibuprofen] Other (See Comments)    "Gives false reading in blood"    ROS     Objective:    Physical Exam  Constitutional: She is oriented to person, place, and time. She appears well-developed and well-nourished.  HENT:  Head: Normocephalic and atraumatic.  Eyes: Conjunctivae and EOM are normal.  Neck: Normal range of motion. Neck supple. No JVD present. Carotid bruit is not present. No thyromegaly present.  Cardiovascular: Normal rate, regular rhythm and normal heart sounds.  No murmur heard. Pulmonary/Chest: Effort normal and breath  sounds normal. No respiratory distress. She has no wheezes. She has no rales. She exhibits no tenderness.  Musculoskeletal: She exhibits no edema.  Neurological: She is alert and oriented to person, place, and time.  Psychiatric: She has a  normal mood and affect.  Nursing note and vitals reviewed.       BP 128/76 (BP Location: Right Arm, Cuff Size: Normal)   Pulse 74   Temp 98.2 F (36.8 C) (Oral)   Resp 16   Ht 5\' 4"  (1.626 m)   Wt 142 lb 12.8 oz (64.8 kg)   SpO2 97%   BMI 24.51 kg/m  Wt Readings from Last 3 Encounters:  04/19/17 142 lb 12.8 oz (64.8 kg)  04/08/17 147 lb (66.7 kg)  11/30/16 142 lb (64.4 kg)   BP Readings from Last 3 Encounters:  04/19/17 128/76  04/08/17 118/82  11/30/16 118/80     Immunization History  Administered Date(s) Administered  . Td 04/06/2013  . Tdap 04/06/1998    Health Maintenance  Topic Date Due  . DEXA SCAN  12/15/2009  . PNA vac Low Risk Adult (1 of 2 - PCV13) 12/15/2009  . MAMMOGRAM  09/11/2017  . COLONOSCOPY  05/26/2022  . TETANUS/TDAP  04/07/2023  . Hepatitis C Screening  Completed    Lab Results  Component Value Date   WBC 4.6 11/30/2016   HGB 14.4 11/30/2016   HCT 42.6 11/30/2016   PLT 208.0 11/30/2016   GLUCOSE 85 04/08/2017   CHOL 169 12/13/2015   TRIG 65 12/13/2015   HDL 73 (A) 12/13/2015   LDLCALC 83 12/13/2015   ALT 15 08/12/2016   AST 19 08/12/2016   NA 140 04/08/2017   K 3.5 04/08/2017   CL 101 04/08/2017   CREATININE 0.80 04/08/2017   BUN 14 04/08/2017   CO2 33 (H) 04/08/2017   TSH 1.33 04/08/2017   INR 1.1 (H) 05/07/2015    Lab Results  Component Value Date   TSH 1.33 04/08/2017   Lab Results  Component Value Date   WBC 4.6 11/30/2016   HGB 14.4 11/30/2016   HCT 42.6 11/30/2016   MCV 97.8 11/30/2016   PLT 208.0 11/30/2016   Lab Results  Component Value Date   NA 140 04/08/2017   K 3.5 04/08/2017   CO2 33 (H) 04/08/2017   GLUCOSE 85 04/08/2017   BUN 14 04/08/2017   CREATININE 0.80  04/08/2017   BILITOT 0.5 08/12/2016   ALKPHOS 63 08/12/2016   AST 19 08/12/2016   ALT 15 08/12/2016   PROT 7.3 08/12/2016   ALBUMIN 4.2 08/12/2016   CALCIUM 8.8 04/08/2017   ANIONGAP 14 04/09/2016   GFR 74.87 04/08/2017   Lab Results  Component Value Date   CHOL 169 12/13/2015   Lab Results  Component Value Date   HDL 73 (A) 12/13/2015   Lab Results  Component Value Date   LDLCALC 83 12/13/2015   Lab Results  Component Value Date   TRIG 65 12/13/2015   Lab Results  Component Value Date   CHOLHDL 2.5 06/11/2015   No results found for: HGBA1C       Assessment & Plan:   Problem List Items Addressed This Visit    None    Visit Diagnoses    Wound infection    -  Primary   Relevant Medications   doxycycline (VIBRA-TABS) 100 MG tablet   cefTRIAXone (ROCEPHIN) injection 1 g (Completed)    f/u 3 days or sooner prn  I have discontinued Claris Gower. Podoll's LINZESS. I am also having her start on doxycycline. Additionally, I am having her maintain her vitamin D (CHOLECALCIFEROL), BREWERS YEAST PO, multivitamin, Biotin, estradiol, Ascorbic Acid (VITAMIN C PO), FOLIC ACID PO, OVER THE COUNTER MEDICATION, busPIRone,  potassium chloride SA, dicyclomine, SYNTHROID, furosemide, clopidogrel, and lactulose (encephalopathy). We administered cefTRIAXone.  Meds ordered this encounter  Medications  . doxycycline (VIBRA-TABS) 100 MG tablet    Sig: Take 1 tablet (100 mg total) by mouth 2 (two) times daily.    Dispense:  20 tablet    Refill:  0  . cefTRIAXone (ROCEPHIN) injection 1 g    CMA served as scribe during this visit. History, Physical and Plan performed by medical provider. Documentation and orders reviewed and attested to.  [name], DO

## 2017-04-19 NOTE — Patient Instructions (Signed)
Wound Infection A wound infection happens when germs start to grow in the wound. Germs that cause wound infections are most commonly bacteria. Other types of infections can occur as well. In some cases, infection can cause the wound to break open. Wound infections need treatment. If a wound infection is left untreated, complications can occur. This may include an infection in your bloodstream (sepsis) or in a bone (osteomyelitis). What are the causes? This condition is caused by germs growing in the wound. What increases the risk? The following factors may make you more likely to develop this condition:  Having a weak body defense system (immune system).  Having diabetes.  Taking steroid medicines for a long time (chronic use).  Smoking.  Older age.  Being overweight.  What are the signs or symptoms? Symptoms of this condition include:  Having more redness, swelling, or pain at the wound site.  Having more blood, pus, or fluid at the wound site.  A bad smell coming from a wound or bandage (dressing).  Having a fever.  Feeling tired or fatigued.  How is this diagnosed? This condition is diagnosed with a medical history and physical exam. You may also have blood tests. How is this treated? This condition is treated with an antibiotic medicine. The infection should improve 24-48 hours after you start antibiotics. Any redness around the wound should stop spreading, and the wound should be less painful. Follow these instructions at home: Medicines  Take or apply over-the-counter and prescription medicines only as told by your health care provider.  If you were prescribed antibiotic medicine, take or apply it as told by your health care provider. Do not stop using the antibiotic even if your condition improves. Wound care  Clean the wound each day or as told by your health care provider. ? Wash the wound with mild soap and water. ? Rinse the wound with water to remove all  soap. ? Pat the wound dry with a clean towel. Do not rub it.  Follow instructions from your health care provider about how to take care of your wound. Make sure you: ? Wash your hands with soap and water before you change your dressing. If soap and water are not available, use hand sanitizer. ? Change your dressing as told by your health care provider. ? Leave stitches (sutures), skin glue, or adhesive strips in place, if this applies. These skin closures may need to stay in place for 2 weeks or longer. If adhesive strip edges start to loosen and curl up, you may trim the loose edges. Do not remove adhesive strips completely unless your health care provider tells you to do that. Some wounds are left open to heal on their own.  Check your wound every day for signs of infection. Watch for: ? More redness, swelling, or pain. ? More fluid or blood. ? Warmth. ? Pus or a bad smell. General instructions  Keep the dressing dry until your health care provider says it can be removed.  Do not take baths, swim, use a hot tub, or do anything that would put your wound underwater until your health care provider approves.  Raise (elevate) the injured area above the level of your heart while you are sitting or lying down.  Do not scratch or pick at the wound.  Keep all follow-up visits as told by your health care provider. This is important. Contact a health care provider if:  Your pain is not controlled with medicine.  You have more   redness, swelling, or pain around your wound.  You have more fluid or blood coming from your wound.  Your wound feels warm to the touch.  You have pus coming from your wound.  You continue to notice a bad smell coming from your wound or your dressing.  Your wound that was closed breaks open. Get help right away if:  You have a red streak going away from your wound.  You have a fever. This information is not intended to replace advice given to you by your  health care provider. Make sure you discuss any questions you have with your health care provider. Document Released: 12/20/2002 Document Revised: 09/04/2015 Document Reviewed: 09/10/2014 Elsevier Interactive Patient Education  2018 [employer]  

## 2017-04-21 DIAGNOSIS — H53482 Generalized contraction of visual field, left eye: Secondary | ICD-10-CM | POA: Diagnosis not present

## 2017-04-22 ENCOUNTER — Ambulatory Visit (INDEPENDENT_AMBULATORY_CARE_PROVIDER_SITE_OTHER): Payer: Medicare Other | Admitting: Family Medicine

## 2017-04-22 ENCOUNTER — Ambulatory Visit (HOSPITAL_BASED_OUTPATIENT_CLINIC_OR_DEPARTMENT_OTHER)
Admission: RE | Admit: 2017-04-22 | Discharge: 2017-04-22 | Disposition: A | Discharge status: Home / Self Care | Payer: Medicare Other | Source: Ambulatory Visit | Attending: Family Medicine | Admitting: Family Medicine

## 2017-04-22 ENCOUNTER — Encounter: Payer: Self-pay | Admitting: Family Medicine

## 2017-04-22 ENCOUNTER — Ambulatory Visit: Payer: PRIVATE HEALTH INSURANCE | Admitting: Family Medicine

## 2017-04-22 VITALS — BP 130/88 | HR 73 | Temp 97.7°F | Ht 64.0 in | Wt 142.0 lb

## 2017-04-22 DIAGNOSIS — L03116 Cellulitis of left lower limb: Secondary | ICD-10-CM | POA: Insufficient documentation

## 2017-04-22 DIAGNOSIS — M7989 Other specified soft tissue disorders: Secondary | ICD-10-CM | POA: Diagnosis not present

## 2017-04-22 MED ORDER — CEFTRIAXONE SODIUM 1 G IJ SOLR
1.0000 g | Freq: Once | INTRAMUSCULAR | Status: AC
Start: 2017-04-22 — End: 2017-04-22
  Administered 2017-04-22: 1 g via INTRAMUSCULAR

## 2017-04-22 NOTE — Progress Notes (Signed)
Subjective:  I acted as a Education administrator for [employer], [place]   Patient ID: Alexandria [name], female    DOB: May 26, 1945, 72 y.o.   MRN: [id number]  Chief Complaint  Patient presents with  . Wound Check    Wound on left shin Pt wants rechecked.     HPI  Patient is in today for wound recheck.   It has improved but her L shin is tender --- down close to ankle.  No fevers.  Redness has improved.  Pt still taking doxy.   Patient Care Team: Carollee Herter, Alferd Apa, DO as PCP - General (Family Medicine) Maisie Fus, MD as Consulting Physician (Obstetrics and Gynecology)   Past Medical History:  Diagnosis Date  . Arthritis   . Asthmatic bronchitis with acute exacerbation 02/06/2015  . Bronchial pneumonia   . Colon polyp   . Diverticulitis   . DVT (deep venous thrombosis) ([place])    lower extremity  . H/O cardiac catheterization 2004   Normal coronary arteries  . Heart murmur   . History of stress test 05/21/2011  . Hx of echocardiogram 01/27/2010   Normal Ef 55% the transmitral spectral doppler flow pattern is normal for age. the left ventricular wall motion is normal  . Migraines   . Thyroid disease   . TIA (transient ischemic attack)    8 years ago    Past Surgical History:  Procedure Laterality Date  . BREAST BIOPSY     Isaiah Blakes  . CARDIAC CATHETERIZATION     10/2013  . LEFT HEART CATHETERIZATION WITH CORONARY ANGIOGRAM N/A 10/25/2013   Procedure: LEFT HEART CATHETERIZATION WITH CORONARY ANGIOGRAM;  Surgeon: Troy Sine, MD;  Location: [facility] CATH LAB;  Service: Cardiovascular;  Laterality: N/A;  . NECK SURGERY     x's 2  . TOTAL KNEE ARTHROPLASTY     Bilateral x's 2  . VAGINAL HYSTERECTOMY  10/17/1998   Dory Horn    Family History  Problem Relation Age of Onset  . Arthritis Unknown   . Colon cancer Father   . Prostate cancer Father   . HIV Brother        19  . Kidney cancer Brother   . Colon cancer Brother   . Lung cancer Brother   . Other Brother     Mouth Cancer  . Ovarian cancer Mother   . Uterine cancer Mother   . Heart disease Maternal Grandmother   . Stroke Maternal Grandmother   . Hypertension Maternal Grandmother   . Diabetes Maternal Grandmother     Social History   Socioeconomic History  . Marital status: Married    Spouse name: [name]  . Number of children: 3  . Years of education: [employer]  . Highest education level: Not on file  Social Needs  . Financial resource strain: Not on file  . Food insecurity - worry: Not on file  . Food insecurity - inability: Not on file  . Transportation needs - medical: Not on file  . Transportation needs - non-medical: Not on file  Occupational History  . Occupation: DISABLED    Employer: UNEMPLOYED  Tobacco Use  . Smoking status: Former Smoker    Years: 2.00    Last attempt to quit: 04/06/1969    Years since quitting: 48.0  . Smokeless tobacco: Never Used  . Tobacco comment: socially smoked x 2 years  Substance and Sexual Activity  . Alcohol use: No    Alcohol/week: 0.0 oz  .  Drug use: No  . Sexual activity: Not on file  Other Topics Concern  . Not on file  Social History Narrative   Lives with husband [name]   Caffeine use: coffee (2 cups per day)   Mostly right-handed    Outpatient Medications Prior to Visit  Medication Sig Dispense Refill  . Ascorbic Acid (VITAMIN C PO) Vitamin C 500mg -Take 2-4 capsules by mouth daily.    . Biotin (RA BIOTIN) 1000 MCG tablet Take 1,000 mcg by mouth 2 (two) times daily.     Marland Kitchen BREWERS YEAST PO Take 2 tablets by mouth 2 (two) times daily.     . busPIRone (BUSPAR) 15 MG tablet TAKE 1 TABLET (15 MG TOTAL) BY MOUTH 2 (TWO) TIMES DAILY. (Patient taking differently: Take 15 mg by mouth at bedtime as needed. ) 60 tablet 3  . clopidogrel (PLAVIX) 75 MG tablet TAKE 1 TABLET BY MOUTH EVERY DAY 90 tablet 1  . dicyclomine (BENTYL) 20 MG tablet TAKE 1 TABLET BY MOUTH 4 TIMES A DAY AS NEEDED 360 tablet 1  . doxycycline (VIBRA-TABS) 100 MG tablet  Take 1 tablet (100 mg total) by mouth 2 (two) times daily. 20 tablet 0  . estradiol (ESTRACE) 2 MG tablet Take 2-3 mg by mouth daily.   11  . FOLIC ACID PO Folic Acid 50mg -Take 1 tablet by mouth daily.    . furosemide (LASIX) 40 MG tablet Take 1 tablet (40 mg total) by mouth 2 (two) times daily. 180 tablet 1  . lactulose, encephalopathy, (CHRONULAC) 10 GM/15ML SOLN TAKE 30 MLS (20 G TOTAL) BY MOUTH EVERY 6 (SIX) HOURS AS NEEDED. 1892 mL 1  . Multiple Vitamin (MULTIVITAMIN) tablet Take 1 tablet by mouth daily.    Marland Kitchen OVER THE COUNTER MEDICATION Omega-3 Fatty acids-Vitamin E (fish oil) 1 capsule by mouth daily.    . potassium chloride SA (K-DUR,KLOR-CON) 20 MEQ tablet Take 1 tablet (20 mEq total) by mouth 2 (two) times daily. 180 tablet 2  . SYNTHROID 112 MCG tablet TAKE 1 TABLET BY MOUTH EVERY DAY BEFORE BREAKFAST 90 tablet 1  . vitamin D, CHOLECALCIFEROL, 400 UNITS tablet Take 400 Units by mouth 2 (two) times daily.      No facility-administered medications prior to visit.     Allergies  Allergen Reactions  . Prednisone Other (See Comments)    Changed personality   . Atrovent Hfa [Ipratropium Bromide Hfa] Other (See Comments)    Pt could not sleep  . Cheratussin Ac [Guaifenesin-Codeine]   . Hydrocodone Itching  . Influenza Vaccines Other (See Comments)    Pt reports heart attack after last flu shot  . Motrin [Ibuprofen] Other (See Comments)    "Gives false reading in blood"    Review of Systems  Constitutional: Negative for chills, fever and malaise/fatigue.  HENT: Negative for congestion and hearing loss.   Eyes: Negative for discharge.  Respiratory: Negative for cough, sputum production and shortness of breath.   Cardiovascular: Negative for chest pain, palpitations and leg swelling.  Gastrointestinal: Negative for abdominal pain, blood in stool, constipation, diarrhea, heartburn, nausea and vomiting.  Genitourinary: Negative for dysuria, frequency, hematuria and urgency.    Musculoskeletal: Negative for back pain, falls and myalgias.  Skin: Negative for rash.  Neurological: Negative for dizziness, sensory change, loss of consciousness, weakness and headaches.  Endo/Heme/Allergies: Negative for environmental allergies. Does not bruise/bleed easily.  Psychiatric/Behavioral: Negative for depression and suicidal ideas. The patient is not nervous/anxious and does not have insomnia.  Objective:    Physical Exam  Constitutional: She is oriented to person, place, and time. She appears well-developed and well-nourished.  HENT:  Head: Normocephalic and atraumatic.  Eyes: Conjunctivae and EOM are normal.  Neck: Normal range of motion. Neck supple. No JVD present. Carotid bruit is not present. No thyromegaly present.  Cardiovascular: Normal rate, regular rhythm and normal heart sounds.  No murmur heard. Pulmonary/Chest: Effort normal and breath sounds normal. No respiratory distress. She has no wheezes. She has no rales. She exhibits no tenderness.  Musculoskeletal: She exhibits no edema.  Neurological: She is alert and oriented to person, place, and time.  Skin:     Psychiatric: She has a normal mood and affect.  Nursing note and vitals reviewed.     BP 130/88   Pulse 73   Temp 97.7 F (36.5 C) (Oral)   Ht 5\' 4"  (1.626 m)   Wt 142 lb (64.4 kg)   SpO2 98%   BMI 24.37 kg/m  Wt Readings from Last 3 Encounters:  04/22/17 142 lb (64.4 kg)  04/19/17 142 lb 12.8 oz (64.8 kg)  04/08/17 147 lb (66.7 kg)   BP Readings from Last 3 Encounters:  04/22/17 130/88  04/19/17 128/76  04/08/17 118/82     Immunization History  Administered Date(s) Administered  . Td 04/06/2013  . Tdap 04/06/1998    Health Maintenance  Topic Date Due  . DEXA SCAN  12/15/2009  . PNA vac Low Risk Adult (1 of 2 - PCV13) 12/15/2009  . MAMMOGRAM  09/11/2017  . COLONOSCOPY  05/26/2022  . TETANUS/TDAP  04/07/2023  . Hepatitis C Screening  Completed    Lab Results   Component Value Date   WBC 4.6 11/30/2016   HGB 14.4 11/30/2016   HCT 42.6 11/30/2016   PLT 208.0 11/30/2016   GLUCOSE 85 04/08/2017   CHOL 169 12/13/2015   TRIG 65 12/13/2015   HDL 73 (A) 12/13/2015   LDLCALC 83 12/13/2015   ALT 15 08/12/2016   AST 19 08/12/2016   NA 140 04/08/2017   K 3.5 04/08/2017   CL 101 04/08/2017   CREATININE 0.80 04/08/2017   BUN 14 04/08/2017   CO2 33 (H) 04/08/2017   TSH 1.33 04/08/2017   INR 1.1 (H) 05/07/2015    Lab Results  Component Value Date   TSH 1.33 04/08/2017   Lab Results  Component Value Date   WBC 4.6 11/30/2016   HGB 14.4 11/30/2016   HCT 42.6 11/30/2016   MCV 97.8 11/30/2016   PLT 208.0 11/30/2016   Lab Results  Component Value Date   NA 140 04/08/2017   K 3.5 04/08/2017   CO2 33 (H) 04/08/2017   GLUCOSE 85 04/08/2017   BUN 14 04/08/2017   CREATININE 0.80 04/08/2017   BILITOT 0.5 08/12/2016   ALKPHOS 63 08/12/2016   AST 19 08/12/2016   ALT 15 08/12/2016   PROT 7.3 08/12/2016   ALBUMIN 4.2 08/12/2016   CALCIUM 8.8 04/08/2017   ANIONGAP 14 04/09/2016   GFR 74.87 04/08/2017   Lab Results  Component Value Date   CHOL 169 12/13/2015   Lab Results  Component Value Date   HDL 73 (A) 12/13/2015   Lab Results  Component Value Date   LDLCALC 83 12/13/2015   Lab Results  Component Value Date   TRIG 65 12/13/2015   Lab Results  Component Value Date   CHOLHDL 2.5 06/11/2015   No results found for: HGBA1C       Assessment &  Plan:   Problem List Items Addressed This Visit    None    Visit Diagnoses    Cellulitis of left lower leg    -  Primary   Relevant Medications   cefTRIAXone (ROCEPHIN) injection 1 g (Completed)   Other Relevant Orders   DG Tibia/Fibula Left    finish doxy and rto prn   I am having [name] maintain her vitamin D (CHOLECALCIFEROL), BREWERS YEAST PO, multivitamin, Biotin, estradiol, Ascorbic Acid (VITAMIN C PO), FOLIC ACID PO, OVER THE COUNTER MEDICATION,  busPIRone, potassium chloride SA, dicyclomine, SYNTHROID, furosemide, clopidogrel, lactulose (encephalopathy), and doxycycline. We administered cefTRIAXone.  Meds ordered this encounter  Medications  . cefTRIAXone (ROCEPHIN) injection 1 g    CMA served as scribe during this visit. History, Physical and Plan performed by medical provider. Documentation and orders reviewed and attested to.  [name], DO

## 2017-04-22 NOTE — Patient Instructions (Signed)

## 2017-04-30 ENCOUNTER — Ambulatory Visit (INDEPENDENT_AMBULATORY_CARE_PROVIDER_SITE_OTHER): Payer: Medicare Other | Admitting: Family Medicine

## 2017-04-30 ENCOUNTER — Encounter: Payer: Self-pay | Admitting: Family Medicine

## 2017-04-30 VITALS — BP 130/68 | HR 75 | Temp 97.9°F | Resp 16 | Ht 64.0 in | Wt 141.2 lb

## 2017-04-30 DIAGNOSIS — D485 Neoplasm of uncertain behavior of skin: Secondary | ICD-10-CM | POA: Diagnosis not present

## 2017-04-30 DIAGNOSIS — L03116 Cellulitis of left lower limb: Secondary | ICD-10-CM | POA: Diagnosis not present

## 2017-04-30 DIAGNOSIS — C4441 Basal cell carcinoma of skin of scalp and neck: Secondary | ICD-10-CM | POA: Diagnosis not present

## 2017-04-30 DIAGNOSIS — R519 Headache, unspecified: Secondary | ICD-10-CM

## 2017-04-30 DIAGNOSIS — R51 Headache: Secondary | ICD-10-CM

## 2017-04-30 DIAGNOSIS — Z136 Encounter for screening for cardiovascular disorders: Secondary | ICD-10-CM

## 2017-04-30 NOTE — Progress Notes (Signed)
Patient ID: Alexandria [name], female   DOB: 1945/12/21, 72 y.o.   MRN: [id number]    Subjective:  I acted as a Education administrator for Dr. Carollee Herter.  Guerry Bruin, [place]   Patient ID: Alexandria [name], female    DOB: 1945/11/21, 72 y.o.   MRN: [id number]  Chief Complaint  Patient presents with  . Cellulitis    left lower leg    HPI  Patient is in today for follow up cellulitis of left lower leg.  She states when she got out of the shower is look pussy.  Patient Care Team: Carollee Herter, Alferd Apa, DO as PCP - General (Family Medicine) Maisie Fus, MD as Consulting Physician (Obstetrics and Gynecology)   Past Medical History:  Diagnosis Date  . Arthritis   . Asthmatic bronchitis with acute exacerbation 02/06/2015  . Bronchial pneumonia   . Colon polyp   . Diverticulitis   . DVT (deep venous thrombosis) ([place])    lower extremity  . H/O cardiac catheterization 2004   Normal coronary arteries  . Heart murmur   . History of stress test 05/21/2011  . Hx of echocardiogram 01/27/2010   Normal Ef 55% the transmitral spectral doppler flow pattern is normal for age. the left ventricular wall motion is normal  . Migraines   . Thyroid disease   . TIA (transient ischemic attack)    8 years ago    Past Surgical History:  Procedure Laterality Date  . BREAST BIOPSY     Isaiah Blakes  . CARDIAC CATHETERIZATION     10/2013  . LEFT HEART CATHETERIZATION WITH CORONARY ANGIOGRAM N/A 10/25/2013   Procedure: LEFT HEART CATHETERIZATION WITH CORONARY ANGIOGRAM;  Surgeon: Troy Sine, MD;  Location: [facility] CATH LAB;  Service: Cardiovascular;  Laterality: N/A;  . NECK SURGERY     x's 2  . TOTAL KNEE ARTHROPLASTY     Bilateral x's 2  . VAGINAL HYSTERECTOMY  10/17/1998   Dory Horn    Family History  Problem Relation Age of Onset  . Arthritis Unknown   . Colon cancer Father   . Prostate cancer Father   . HIV Brother        6  . Kidney cancer Brother   . Colon cancer Brother   . Lung cancer Brother     . Other Brother        Mouth Cancer  . Ovarian cancer Mother   . Uterine cancer Mother   . Heart disease Maternal Grandmother   . Stroke Maternal Grandmother   . Hypertension Maternal Grandmother   . Diabetes Maternal Grandmother     Social History   Socioeconomic History  . Marital status: Married    Spouse name: [name]  . Number of children: 3  . Years of education: [employer]  . Highest education level: Not on file  Social Needs  . Financial resource strain: Not on file  . Food insecurity - worry: Not on file  . Food insecurity - inability: Not on file  . Transportation needs - medical: Not on file  . Transportation needs - non-medical: Not on file  Occupational History  . Occupation: DISABLED    Employer: UNEMPLOYED  Tobacco Use  . Smoking status: Former Smoker    Years: 2.00    Last attempt to quit: 04/06/1969    Years since quitting: 48.0  . Smokeless tobacco: Never Used  . Tobacco comment: socially smoked x 2 years  Substance and Sexual Activity  . Alcohol use: No  Alcohol/week: 0.0 oz  . Drug use: No  . Sexual activity: Not on file  Other Topics Concern  . Not on file  Social History Narrative   Lives with husband [name]   Caffeine use: coffee (2 cups per day)   Mostly right-handed    Outpatient Medications Prior to Visit  Medication Sig Dispense Refill  . Ascorbic Acid (VITAMIN C PO) Vitamin C 500mg -Take 2-4 capsules by mouth daily.    . Biotin (RA BIOTIN) 1000 MCG tablet Take 1,000 mcg by mouth 2 (two) times daily.     Marland Kitchen BREWERS YEAST PO Take 2 tablets by mouth 2 (two) times daily.     . busPIRone (BUSPAR) 15 MG tablet TAKE 1 TABLET (15 MG TOTAL) BY MOUTH 2 (TWO) TIMES DAILY. (Patient taking differently: Take 15 mg by mouth at bedtime as needed. ) 60 tablet 3  . clopidogrel (PLAVIX) 75 MG tablet TAKE 1 TABLET BY MOUTH EVERY DAY 90 tablet 1  . dicyclomine (BENTYL) 20 MG tablet TAKE 1 TABLET BY MOUTH 4 TIMES A DAY AS NEEDED 360 tablet 1  . doxycycline  (VIBRA-TABS) 100 MG tablet Take 1 tablet (100 mg total) by mouth 2 (two) times daily. 20 tablet 0  . estradiol (ESTRACE) 2 MG tablet Take 2-3 mg by mouth daily.   11  . FOLIC ACID PO Folic Acid 50mg -Take 1 tablet by mouth daily.    . furosemide (LASIX) 40 MG tablet Take 1 tablet (40 mg total) by mouth 2 (two) times daily. 180 tablet 1  . lactulose, encephalopathy, (CHRONULAC) 10 GM/15ML SOLN TAKE 30 MLS (20 G TOTAL) BY MOUTH EVERY 6 (SIX) HOURS AS NEEDED. 1892 mL 1  . Multiple Vitamin (MULTIVITAMIN) tablet Take 1 tablet by mouth daily.    Marland Kitchen OVER THE COUNTER MEDICATION Omega-3 Fatty acids-Vitamin E (fish oil) 1 capsule by mouth daily.    . potassium chloride SA (K-DUR,KLOR-CON) 20 MEQ tablet Take 1 tablet (20 mEq total) by mouth 2 (two) times daily. 180 tablet 2  . SYNTHROID 112 MCG tablet TAKE 1 TABLET BY MOUTH EVERY DAY BEFORE BREAKFAST 90 tablet 1  . vitamin D, CHOLECALCIFEROL, 400 UNITS tablet Take 400 Units by mouth 2 (two) times daily.      No facility-administered medications prior to visit.     Allergies  Allergen Reactions  . Prednisone Other (See Comments)    Changed personality   . Atrovent Hfa [Ipratropium Bromide Hfa] Other (See Comments)    Pt could not sleep  . Cheratussin Ac [Guaifenesin-Codeine]   . Hydrocodone Itching  . Influenza Vaccines Other (See Comments)    Pt reports heart attack after last flu shot  . Motrin [Ibuprofen] Other (See Comments)    "Gives false reading in blood"    Review of Systems  Constitutional: Negative for fever and malaise/fatigue.  HENT: Negative for congestion.   Eyes: Negative for blurred vision.  Respiratory: Negative for cough and shortness of breath.   Cardiovascular: Negative for chest pain, palpitations and leg swelling.  Gastrointestinal: Negative for vomiting.  Musculoskeletal: Negative for back pain.  Skin: Negative for rash.  Neurological: Negative for loss of consciousness and headaches.       Objective:    Physical  Exam  Constitutional: She is oriented to person, place, and time. She appears well-developed and well-nourished.  HENT:  Head: Normocephalic and atraumatic.  Eyes: Conjunctivae and EOM are normal.  Neck: Normal range of motion. Neck supple. No JVD present. Carotid bruit is not present. No  thyromegaly present.  Cardiovascular: Normal rate, regular rhythm and normal heart sounds.  No murmur heard. Pulmonary/Chest: Effort normal and breath sounds normal. No respiratory distress. She has no wheezes. She has no rales. She exhibits no tenderness.  Musculoskeletal: She exhibits no edema.  Neurological: She is alert and oriented to person, place, and time.  Skin: No rash noted. No erythema. No pallor.  Wound healing well  Psychiatric: She has a normal mood and affect.  Nursing note and vitals reviewed.   BP 130/68 (BP Location: Left Arm, Cuff Size: Normal)   Pulse 75   Temp 97.9 F (36.6 C) (Oral)   Resp 16   Ht 5\' 4"  (1.626 m)   Wt 141 lb 3.2 oz (64 kg)   SpO2 97%   BMI 24.24 kg/m  Wt Readings from Last 3 Encounters:  04/30/17 141 lb 3.2 oz (64 kg)  04/22/17 142 lb (64.4 kg)  04/19/17 142 lb 12.8 oz (64.8 kg)   BP Readings from Last 3 Encounters:  04/30/17 130/68  04/22/17 130/88  04/19/17 128/76     Immunization History  Administered Date(s) Administered  . Td 04/06/2013  . Tdap 04/06/1998    Health Maintenance  Topic Date Due  . DEXA SCAN  12/15/2009  . PNA vac Low Risk Adult (1 of 2 - PCV13) 12/15/2009  . MAMMOGRAM  09/11/2017  . COLONOSCOPY  05/26/2022  . TETANUS/TDAP  04/07/2023  . Hepatitis C Screening  Completed    Lab Results  Component Value Date   WBC 4.6 11/30/2016   HGB 14.4 11/30/2016   HCT 42.6 11/30/2016   PLT 208.0 11/30/2016   GLUCOSE 85 04/08/2017   CHOL 169 12/13/2015   TRIG 65 12/13/2015   HDL 73 (A) 12/13/2015   LDLCALC 83 12/13/2015   ALT 15 08/12/2016   AST 19 08/12/2016   NA 140 04/08/2017   K 3.5 04/08/2017   CL 101 04/08/2017    CREATININE 0.80 04/08/2017   BUN 14 04/08/2017   CO2 33 (H) 04/08/2017   TSH 1.33 04/08/2017   INR 1.1 (H) 05/07/2015    Lab Results  Component Value Date   TSH 1.33 04/08/2017   Lab Results  Component Value Date   WBC 4.6 11/30/2016   HGB 14.4 11/30/2016   HCT 42.6 11/30/2016   MCV 97.8 11/30/2016   PLT 208.0 11/30/2016   Lab Results  Component Value Date   NA 140 04/08/2017   K 3.5 04/08/2017   CO2 33 (H) 04/08/2017   GLUCOSE 85 04/08/2017   BUN 14 04/08/2017   CREATININE 0.80 04/08/2017   BILITOT 0.5 08/12/2016   ALKPHOS 63 08/12/2016   AST 19 08/12/2016   ALT 15 08/12/2016   PROT 7.3 08/12/2016   ALBUMIN 4.2 08/12/2016   CALCIUM 8.8 04/08/2017   ANIONGAP 14 04/09/2016   GFR 74.87 04/08/2017   Lab Results  Component Value Date   CHOL 169 12/13/2015   Lab Results  Component Value Date   HDL 73 (A) 12/13/2015   Lab Results  Component Value Date   LDLCALC 83 12/13/2015   Lab Results  Component Value Date   TRIG 65 12/13/2015   Lab Results  Component Value Date   CHOLHDL 2.5 06/11/2015   No results found for: HGBA1C       Assessment & Plan:   Problem List Items Addressed This Visit      Unprioritized   Cellulitis of left lower extremity - Primary    Healing well con't  with topical care rto prn        Other Visit Diagnoses    Frequent headaches       Relevant Orders   Lipid panel   Comprehensive metabolic panel   Ischemic heart disease screen       Relevant Orders   Lipid panel   Comprehensive metabolic panel      I am having [name] maintain her vitamin D (CHOLECALCIFEROL), BREWERS YEAST PO, multivitamin, Biotin, estradiol, Ascorbic Acid (VITAMIN C PO), FOLIC ACID PO, OVER THE COUNTER MEDICATION, busPIRone, potassium chloride SA, dicyclomine, SYNTHROID, furosemide, clopidogrel, lactulose (encephalopathy), and doxycycline.  No orders of the defined types were placed in this encounter.   CMA served as Education administrator during  this visit. History, Physical and Plan performed by medical provider. Documentation and orders reviewed and attested to.  [name], DO

## 2017-04-30 NOTE — Patient Instructions (Signed)

## 2017-04-30 NOTE — Assessment & Plan Note (Signed)
Healing well con't with topical care rto prn

## 2017-05-03 ENCOUNTER — Other Ambulatory Visit (INDEPENDENT_AMBULATORY_CARE_PROVIDER_SITE_OTHER): Payer: Medicare Other

## 2017-05-03 DIAGNOSIS — R51 Headache: Secondary | ICD-10-CM

## 2017-05-03 DIAGNOSIS — R519 Headache, unspecified: Secondary | ICD-10-CM

## 2017-05-03 DIAGNOSIS — Z136 Encounter for screening for cardiovascular disorders: Secondary | ICD-10-CM

## 2017-05-03 LAB — LIPID PANEL
CHOL/HDL RATIO: 3
Cholesterol: 190 mg/dL (ref 0–200)
HDL: 75.5 mg/dL (ref >39.00)
LDL Cholesterol: 102 mg/dL — ABNORMAL HIGH (ref 0–99)
NONHDL: 114.05
TRIGLYCERIDES: 58 mg/dL (ref 0.0–149.0)
VLDL: 11.6 mg/dL (ref 0.0–40.0)

## 2017-05-03 LAB — COMPREHENSIVE METABOLIC PANEL
ALT: 22 U/L (ref 0–35)
AST: 23 U/L (ref 0–37)
Albumin: 4.2 g/dL (ref 3.5–5.2)
Alkaline Phosphatase: 61 U/L (ref 39–117)
BUN: 12 mg/dL (ref 6–23)
CO2: 32 meq/L (ref 19–32)
Calcium: 9.2 mg/dL (ref 8.4–10.5)
Chloride: 100 mEq/L (ref 96–112)
Creatinine, Ser: 0.83 mg/dL (ref 0.40–1.20)
GFR: 71.75 mL/min (ref >60.00)
GLUCOSE: 87 mg/dL (ref 70–99)
POTASSIUM: 3.3 meq/L — AB (ref 3.5–5.1)
Sodium: 143 mEq/L (ref 135–145)
Total Bilirubin: 0.7 mg/dL (ref 0.2–1.2)
Total Protein: 7.5 g/dL (ref 6.0–8.3)

## 2017-05-06 ENCOUNTER — Telehealth: Payer: Self-pay | Admitting: Family Medicine

## 2017-05-06 ENCOUNTER — Telehealth: Payer: Self-pay | Admitting: *Deleted

## 2017-05-06 NOTE — Telephone Encounter (Signed)
Pt called in to follow up on her lab results.   CB: [phone]

## 2017-05-06 NOTE — Telephone Encounter (Signed)
Received Dermatopathology Report results from [facility]; forwarded to provider/SLS 01/31

## 2017-05-06 NOTE — Telephone Encounter (Signed)
Left detailed message that labs have not been reviewed yet.  Also patient dropped off letter with bandage off of her wound.  Per Dr. Etter Sjogren it looks normal and is part of the healing process.

## 2017-05-07 ENCOUNTER — Ambulatory Visit (INDEPENDENT_AMBULATORY_CARE_PROVIDER_SITE_OTHER): Payer: Medicare Other | Admitting: Family Medicine

## 2017-05-07 ENCOUNTER — Encounter: Payer: Self-pay | Admitting: Family Medicine

## 2017-05-07 VITALS — BP 126/80 | HR 81 | Temp 97.7°F | Resp 16 | Ht 64.0 in | Wt 139.2 lb

## 2017-05-07 DIAGNOSIS — F418 Other specified anxiety disorders: Secondary | ICD-10-CM | POA: Diagnosis not present

## 2017-05-07 DIAGNOSIS — R51 Headache: Secondary | ICD-10-CM

## 2017-05-07 DIAGNOSIS — I519 Heart disease, unspecified: Secondary | ICD-10-CM

## 2017-05-07 DIAGNOSIS — R011 Cardiac murmur, unspecified: Secondary | ICD-10-CM | POA: Diagnosis not present

## 2017-05-07 DIAGNOSIS — I5189 Other ill-defined heart diseases: Secondary | ICD-10-CM

## 2017-05-07 DIAGNOSIS — I080 Rheumatic disorders of both mitral and aortic valves: Secondary | ICD-10-CM | POA: Insufficient documentation

## 2017-05-07 DIAGNOSIS — F419 Anxiety disorder, unspecified: Secondary | ICD-10-CM | POA: Insufficient documentation

## 2017-05-07 DIAGNOSIS — R519 Headache, unspecified: Secondary | ICD-10-CM

## 2017-05-07 MED ORDER — BUSPIRONE HCL 15 MG PO TABS: 15.0000 mg | ORAL_TABLET | Freq: Two times a day (BID) | ORAL | 3 refills | Status: DC

## 2017-05-07 NOTE — Progress Notes (Signed)
Patient ID: Alexandria [name], female   DOB: 01-Oct-1945, 72 y.o.   MRN: [id number]     Subjective:  I acted as a Education administrator for Dr. Carollee Herter.  Guerry Bruin, [place]   Patient ID: Alexandria [name], female    DOB: 13-Sep-1945, 72 y.o.   MRN: [id number]  Chief Complaint  Patient presents with  . Headache    left side  . follow up leg    HPI  Patient is in today for headache on left side that she has had on and off since her eye surgery---she has had trouble getting a f/u appointment with her.  Her leg is better. Patient Care Team: Carollee Herter, Alferd Apa, DO as PCP - General (Family Medicine) Maisie Fus, MD as Consulting Physician (Obstetrics and Gynecology)   Past Medical History:  Diagnosis Date  . Arthritis   . Asthmatic bronchitis with acute exacerbation 02/06/2015  . Bronchial pneumonia   . Colon polyp   . Diverticulitis   . DVT (deep venous thrombosis) ([place])    lower extremity  . H/O cardiac catheterization 2004   Normal coronary arteries  . Heart murmur   . History of stress test 05/21/2011  . Hx of echocardiogram 01/27/2010   Normal Ef 55% the transmitral spectral doppler flow pattern is normal for age. the left ventricular wall motion is normal  . Migraines   . Thyroid disease   . TIA (transient ischemic attack)    8 years ago    Past Surgical History:  Procedure Laterality Date  . BREAST BIOPSY     Isaiah Blakes  . CARDIAC CATHETERIZATION     10/2013  . LEFT HEART CATHETERIZATION WITH CORONARY ANGIOGRAM N/A 10/25/2013   Procedure: LEFT HEART CATHETERIZATION WITH CORONARY ANGIOGRAM;  Surgeon: Troy Sine, MD;  Location: [facility] CATH LAB;  Service: Cardiovascular;  Laterality: N/A;  . NECK SURGERY     x's 2  . TOTAL KNEE ARTHROPLASTY     Bilateral x's 2  . VAGINAL HYSTERECTOMY  10/17/1998   Dory Horn    Family History  Problem Relation Age of Onset  . Arthritis Unknown   . Colon cancer Father   . Prostate cancer Father   . HIV Brother        3  . Kidney  cancer Brother   . Colon cancer Brother   . Lung cancer Brother   . Other Brother        Mouth Cancer  . Ovarian cancer Mother   . Uterine cancer Mother   . Heart disease Maternal Grandmother   . Stroke Maternal Grandmother   . Hypertension Maternal Grandmother   . Diabetes Maternal Grandmother     Social History   Socioeconomic History  . Marital status: Married    Spouse name: [name]  . Number of children: 3  . Years of education: [employer]  . Highest education level: Not on file  Social Needs  . Financial resource strain: Not on file  . Food insecurity - worry: Not on file  . Food insecurity - inability: Not on file  . Transportation needs - medical: Not on file  . Transportation needs - non-medical: Not on file  Occupational History  . Occupation: DISABLED    Employer: UNEMPLOYED  Tobacco Use  . Smoking status: Former Smoker    Years: 2.00    Last attempt to quit: 04/06/1969    Years since quitting: 48.1  . Smokeless tobacco: Never Used  . Tobacco comment: socially smoked x  2 years  Substance and Sexual Activity  . Alcohol use: No    Alcohol/week: 0.0 oz  . Drug use: No  . Sexual activity: Not on file  Other Topics Concern  . Not on file  Social History Narrative   Lives with husband [name]   Caffeine use: coffee (2 cups per day)   Mostly right-handed    Outpatient Medications Prior to Visit  Medication Sig Dispense Refill  . Ascorbic Acid (VITAMIN C PO) Vitamin C 500mg -Take 2-4 capsules by mouth daily.    . Biotin (RA BIOTIN) 1000 MCG tablet Take 1,000 mcg by mouth 2 (two) times daily.     Marland Kitchen BREWERS YEAST PO Take 2 tablets by mouth 2 (two) times daily.     . clopidogrel (PLAVIX) 75 MG tablet TAKE 1 TABLET BY MOUTH EVERY DAY 90 tablet 1  . dicyclomine (BENTYL) 20 MG tablet TAKE 1 TABLET BY MOUTH 4 TIMES A DAY AS NEEDED 360 tablet 1  . doxycycline (VIBRA-TABS) 100 MG tablet Take 1 tablet (100 mg total) by mouth 2 (two) times daily. 20 tablet 0  . estradiol  (ESTRACE) 2 MG tablet Take 2-3 mg by mouth daily.   11  . FOLIC ACID PO Folic Acid 50mg -Take 1 tablet by mouth daily.    . furosemide (LASIX) 40 MG tablet Take 1 tablet (40 mg total) by mouth 2 (two) times daily. 180 tablet 1  . lactulose, encephalopathy, (CHRONULAC) 10 GM/15ML SOLN TAKE 30 MLS (20 G TOTAL) BY MOUTH EVERY 6 (SIX) HOURS AS NEEDED. 1892 mL 1  . Multiple Vitamin (MULTIVITAMIN) tablet Take 1 tablet by mouth daily.    Marland Kitchen OVER THE COUNTER MEDICATION Omega-3 Fatty acids-Vitamin E (fish oil) 1 capsule by mouth daily.    . potassium chloride SA (K-DUR,KLOR-CON) 20 MEQ tablet Take 1 tablet (20 mEq total) by mouth 2 (two) times daily. 180 tablet 2  . SYNTHROID 112 MCG tablet TAKE 1 TABLET BY MOUTH EVERY DAY BEFORE BREAKFAST 90 tablet 1  . vitamin D, CHOLECALCIFEROL, 400 UNITS tablet Take 400 Units by mouth 2 (two) times daily.     . busPIRone (BUSPAR) 15 MG tablet TAKE 1 TABLET (15 MG TOTAL) BY MOUTH 2 (TWO) TIMES DAILY. (Patient taking differently: Take 15 mg by mouth at bedtime as needed. ) 60 tablet 3   No facility-administered medications prior to visit.     Allergies  Allergen Reactions  . Prednisone Other (See Comments)    Changed personality   . Atrovent Hfa [Ipratropium Bromide Hfa] Other (See Comments)    Pt could not sleep  . Cheratussin Ac [Guaifenesin-Codeine]   . Hydrocodone Itching  . Influenza Vaccines Other (See Comments)    Pt reports heart attack after last flu shot  . Motrin [Ibuprofen] Other (See Comments)    "Gives false reading in blood"    Review of Systems  Constitutional: Negative for fever and malaise/fatigue.  HENT: Negative for congestion.   Eyes: Negative for blurred vision.  Respiratory: Negative for cough and shortness of breath.   Cardiovascular: Negative for chest pain, palpitations and leg swelling.  Gastrointestinal: Negative for vomiting.  Musculoskeletal: Negative for back pain.  Skin: Negative for rash.  Neurological: Positive for  headaches. Negative for loss of consciousness.       Objective:    Physical Exam  Constitutional: She is oriented to person, place, and time. She appears well-developed and well-nourished.  HENT:  Head: Normocephalic and atraumatic.  Eyes: Conjunctivae and EOM are normal.  Neck: Normal range of motion. Neck supple. No JVD present. Carotid bruit is not present. No thyromegaly present.  Cardiovascular: Normal rate and regular rhythm.  Murmur heard. Pulmonary/Chest: Effort normal and breath sounds normal. No respiratory distress. She has no wheezes. She has no rales. She exhibits no tenderness.  Musculoskeletal: She exhibits no edema.  Neurological: She is alert and oriented to person, place, and time.  Psychiatric: She has a normal mood and affect.  Nursing note and vitals reviewed.   BP 126/80 (BP Location: Left Arm, Cuff Size: Normal)   Pulse 81   Temp 97.7 F (36.5 C) (Oral)   Resp 16   Ht 5\' 4"  (1.626 m)   Wt 139 lb 3.2 oz (63.1 kg)   SpO2 94%   BMI 23.89 kg/m  Wt Readings from Last 3 Encounters:  05/07/17 139 lb 3.2 oz (63.1 kg)  04/30/17 141 lb 3.2 oz (64 kg)  04/22/17 142 lb (64.4 kg)   BP Readings from Last 3 Encounters:  05/07/17 126/80  04/30/17 130/68  04/22/17 130/88     Immunization History  Administered Date(s) Administered  . Td 04/06/2013  . Tdap 04/06/1998    Health Maintenance  Topic Date Due  . DEXA SCAN  12/15/2009  . PNA vac Low Risk Adult (1 of 2 - PCV13) 12/15/2009  . MAMMOGRAM  09/11/2017  . COLONOSCOPY  05/26/2022  . TETANUS/TDAP  04/07/2023  . Hepatitis C Screening  Completed    Lab Results  Component Value Date   WBC 4.6 11/30/2016   HGB 14.4 11/30/2016   HCT 42.6 11/30/2016   PLT 208.0 11/30/2016   GLUCOSE 87 05/03/2017   CHOL 190 05/03/2017   TRIG 58.0 05/03/2017   HDL 75.50 05/03/2017   LDLCALC 102 (H) 05/03/2017   ALT 22 05/03/2017   AST 23 05/03/2017   NA 143 05/03/2017   K 3.3 (L) 05/03/2017   CL 100 05/03/2017    CREATININE 0.83 05/03/2017   BUN 12 05/03/2017   CO2 32 05/03/2017   TSH 1.33 04/08/2017   INR 1.1 (H) 05/07/2015    Lab Results  Component Value Date   TSH 1.33 04/08/2017   Lab Results  Component Value Date   WBC 4.6 11/30/2016   HGB 14.4 11/30/2016   HCT 42.6 11/30/2016   MCV 97.8 11/30/2016   PLT 208.0 11/30/2016   Lab Results  Component Value Date   NA 143 05/03/2017   K 3.3 (L) 05/03/2017   CO2 32 05/03/2017   GLUCOSE 87 05/03/2017   BUN 12 05/03/2017   CREATININE 0.83 05/03/2017   BILITOT 0.7 05/03/2017   ALKPHOS 61 05/03/2017   AST 23 05/03/2017   ALT 22 05/03/2017   PROT 7.5 05/03/2017   ALBUMIN 4.2 05/03/2017   CALCIUM 9.2 05/03/2017   ANIONGAP 14 04/09/2016   GFR 71.75 05/03/2017   Lab Results  Component Value Date   CHOL 190 05/03/2017   Lab Results  Component Value Date   HDL 75.50 05/03/2017   Lab Results  Component Value Date   LDLCALC 102 (H) 05/03/2017   Lab Results  Component Value Date   TRIG 58.0 05/03/2017   Lab Results  Component Value Date   CHOLHDL 3 05/03/2017   No results found for: HGBA1C       Assessment & Plan:   Problem List Items Addressed This Visit      Unprioritized   Cardiac murmur - Primary    ? Worsening-- check echo  Relevant Orders   ECHOCARDIOGRAM COMPLETE   Diastolic dysfunction   Relevant Orders   ECHOCARDIOGRAM COMPLETE   Headache    Pt will f/u with opth  May refer to neuro      Situational anxiety   Relevant Medications   busPIRone (BUSPAR) 15 MG tablet      I am having [name] maintain her vitamin D (CHOLECALCIFEROL), BREWERS YEAST PO, multivitamin, Biotin, estradiol, Ascorbic Acid (VITAMIN C PO), FOLIC ACID PO, OVER THE COUNTER MEDICATION, potassium chloride SA, dicyclomine, SYNTHROID, furosemide, clopidogrel, lactulose (encephalopathy), doxycycline, and busPIRone.  Meds ordered this encounter  Medications  . busPIRone (BUSPAR) 15 MG tablet    Sig: Take 1 tablet  (15 mg total) by mouth 2 (two) times daily.    Dispense:  60 tablet    Refill:  3    CMA served as scribe during this visit. History, Physical and Plan performed by medical provider. Documentation and orders reviewed and attested to.  [name], DO

## 2017-05-07 NOTE — Patient Instructions (Signed)
General Headache Without Cause A headache is pain or discomfort felt around the head or neck area. There are many causes and types of headaches. In some cases, the cause may not be found. Follow these instructions at home: Managing pain  Take over-the-counter and prescription medicines only as told by your doctor.  Lie down in a dark, quiet room when you have a headache.  If directed, apply ice to the head and neck area: ? Put ice in a plastic bag. ? Place a towel between your skin and the bag. ? Leave the ice on for 20 minutes, 2-3 times per day.  Use a heating pad or hot shower to apply heat to the head and neck area as told by your doctor.  Keep lights dim if bright lights bother you or make your headaches worse. Eating and drinking  Eat meals on a regular schedule.  Lessen how much alcohol you drink.  Lessen how much caffeine you drink, or stop drinking caffeine. General instructions  Keep all follow-up visits as told by your doctor. This is important.  Keep a journal to find out if certain things bring on headaches. For example, write down: ? What you eat and drink. ? How much sleep you get. ? Any change to your diet or medicines.  Relax by getting a massage or doing other relaxing activities.  Lessen stress.  Sit up straight. Do not tighten (tense) your muscles.  Do not use tobacco products. This includes cigarettes, chewing tobacco, or e-cigarettes. If you need help quitting, ask your doctor.  Exercise regularly as told by your doctor.  Get enough sleep. This often means 7-9 hours of sleep. Contact a doctor if:  Your symptoms are not helped by medicine.  You have a headache that feels different than the other headaches.  You feel sick to your stomach (nauseous) or you throw up (vomit).  You have a fever. Get help right away if:  Your headache becomes really bad.  You keep throwing up.  You have a stiff neck.  You have trouble seeing.  You have  trouble speaking.  You have pain in the eye or ear.  Your muscles are weak or you lose muscle control.  You lose your balance or have trouble walking.  You feel like you will pass out (faint) or you pass out.  You have confusion. This information is not intended to replace advice given to you by your health care provider. Make sure you discuss any questions you have with your health care provider. Document Released: 12/31/2007 Document Revised: 08/29/2015 Document Reviewed: 07/16/2014 Elsevier Interactive Patient Education  2018 Elsevier Inc.  

## 2017-05-07 NOTE — Assessment & Plan Note (Signed)
?   Worsening-- check echo

## 2017-05-07 NOTE — Assessment & Plan Note (Signed)
Pt will f/u with opth  May refer to neuro

## 2017-05-13 ENCOUNTER — Telehealth: Payer: Self-pay | Admitting: *Deleted

## 2017-05-13 NOTE — Telephone Encounter (Signed)
Copied from [place]. Topic: Referral - Status >> May 12, 2017  2:58 PM Synthia Innocent wrote: Reason for CRM: Checking status of ECHO

## 2017-05-17 NOTE — Telephone Encounter (Signed)
Advised patient that we were still working on referral for echo and we will call her as soon as we get it scheduled.

## 2017-05-19 NOTE — Telephone Encounter (Signed)
Checking the status with referrals

## 2017-05-19 NOTE — Telephone Encounter (Signed)
Pt calling to follow up on this echo Pt states she has called several times and was told someone was working on it. It has been 2 weeks and pt has not heard a thing.. Pt concerned because this is her heart, and needs to be addressed asap.

## 2017-05-20 NOTE — Telephone Encounter (Signed)
Pt is scheduled for 3/8 at 2:15, pt is aware of the appt date/time  Per [place] note

## 2017-06-02 DIAGNOSIS — H5319 Other subjective visual disturbances: Secondary | ICD-10-CM | POA: Insufficient documentation

## 2017-06-02 DIAGNOSIS — H2511 Age-related nuclear cataract, right eye: Secondary | ICD-10-CM | POA: Diagnosis not present

## 2017-06-02 DIAGNOSIS — H35363 Drusen (degenerative) of macula, bilateral: Secondary | ICD-10-CM | POA: Insufficient documentation

## 2017-06-02 DIAGNOSIS — H43812 Vitreous degeneration, left eye: Secondary | ICD-10-CM | POA: Diagnosis not present

## 2017-06-02 DIAGNOSIS — H04123 Dry eye syndrome of bilateral lacrimal glands: Secondary | ICD-10-CM | POA: Diagnosis not present

## 2017-06-02 DIAGNOSIS — H5231 Anisometropia: Secondary | ICD-10-CM | POA: Insufficient documentation

## 2017-06-02 DIAGNOSIS — Z961 Presence of intraocular lens: Secondary | ICD-10-CM | POA: Diagnosis not present

## 2017-06-02 DIAGNOSIS — H538 Other visual disturbances: Secondary | ICD-10-CM | POA: Diagnosis not present

## 2017-06-02 DIAGNOSIS — H16223 Keratoconjunctivitis sicca, not specified as Sjogren's, bilateral: Secondary | ICD-10-CM | POA: Diagnosis not present

## 2017-06-07 ENCOUNTER — Encounter: Payer: Self-pay | Admitting: *Deleted

## 2017-06-07 DIAGNOSIS — C4441 Basal cell carcinoma of skin of scalp and neck: Secondary | ICD-10-CM

## 2017-06-11 ENCOUNTER — Ambulatory Visit (HOSPITAL_BASED_OUTPATIENT_CLINIC_OR_DEPARTMENT_OTHER)
Admission: RE | Admit: 2017-06-11 | Discharge: 2017-06-11 | Disposition: A | Discharge status: Home / Self Care | Payer: Medicare Other | Source: Ambulatory Visit | Attending: Family Medicine | Admitting: Family Medicine

## 2017-06-11 DIAGNOSIS — R011 Cardiac murmur, unspecified: Secondary | ICD-10-CM

## 2017-06-11 DIAGNOSIS — I5189 Other ill-defined heart diseases: Secondary | ICD-10-CM

## 2017-06-12 ENCOUNTER — Other Ambulatory Visit: Payer: Self-pay | Admitting: Family Medicine

## 2017-06-15 ENCOUNTER — Ambulatory Visit (HOSPITAL_COMMUNITY)
Admission: RE | Admit: 2017-06-15 | Discharge: 2017-06-15 | Disposition: A | Discharge status: Home / Self Care | Payer: Medicare Other | Source: Ambulatory Visit | Attending: Family Medicine | Admitting: Family Medicine

## 2017-06-15 ENCOUNTER — Ambulatory Visit: Payer: Medicare Other | Admitting: Family Medicine

## 2017-06-15 DIAGNOSIS — R011 Cardiac murmur, unspecified: Secondary | ICD-10-CM | POA: Diagnosis not present

## 2017-06-15 DIAGNOSIS — I519 Heart disease, unspecified: Secondary | ICD-10-CM

## 2017-06-15 DIAGNOSIS — I080 Rheumatic disorders of both mitral and aortic valves: Secondary | ICD-10-CM | POA: Diagnosis not present

## 2017-06-15 DIAGNOSIS — R0600 Dyspnea, unspecified: Secondary | ICD-10-CM | POA: Diagnosis not present

## 2017-06-15 DIAGNOSIS — Z720 Tobacco use: Secondary | ICD-10-CM | POA: Insufficient documentation

## 2017-06-15 DIAGNOSIS — K5732 Diverticulitis of large intestine without perforation or abscess without bleeding: Secondary | ICD-10-CM | POA: Diagnosis not present

## 2017-06-15 NOTE — Progress Notes (Signed)
  Echocardiogram 2D Echocardiogram has been performed.  Alexandria [name] 06/15/2017, 11:43 AM

## 2017-06-18 ENCOUNTER — Ambulatory Visit (INDEPENDENT_AMBULATORY_CARE_PROVIDER_SITE_OTHER): Payer: Medicare Other | Admitting: Family Medicine

## 2017-06-18 ENCOUNTER — Encounter: Payer: Self-pay | Admitting: Family Medicine

## 2017-06-18 VITALS — BP 124/78 | HR 83 | Temp 98.5°F | Resp 16 | Ht 64.17 in | Wt 141.4 lb

## 2017-06-18 DIAGNOSIS — R1084 Generalized abdominal pain: Secondary | ICD-10-CM | POA: Diagnosis not present

## 2017-06-18 DIAGNOSIS — R011 Cardiac murmur, unspecified: Secondary | ICD-10-CM

## 2017-06-18 NOTE — Assessment & Plan Note (Signed)
Echo results reveiwed with pt

## 2017-06-18 NOTE — Progress Notes (Deleted)
Subjective:  I acted as a Education administrator for [employer]. Yancey Flemings, [place]   Patient ID: Alexandria [name], female    DOB: [birth year], 72 y.o.   MRN: [id number]  Chief Complaint  Patient presents with  . Heart Murmur  . Cellulitis    left leg    HPI  Patient is in today for follow up on cardiac heart murmur, and cellulitis of left lower leg.   Patient Care Team: Carollee Herter, Alferd Apa, DO as PCP - General (Family Medicine) Maisie Fus, MD as Consulting Physician (Obstetrics and Gynecology)   Past Medical History:  Diagnosis Date  . Arthritis   . Asthmatic bronchitis with acute exacerbation 02/06/2015  . Bronchial pneumonia   . Colon polyp   . Diverticulitis   . DVT (deep venous thrombosis) ([place])    lower extremity  . H/O cardiac catheterization 2004   Normal coronary arteries  . Heart murmur   . History of stress test 05/21/2011  . Hx of echocardiogram 01/27/2010   Normal Ef 55% the transmitral spectral doppler flow pattern is normal for age. the left ventricular wall motion is normal  . Migraines   . Thyroid disease   . TIA (transient ischemic attack)    8 years ago    Past Surgical History:  Procedure Laterality Date  . BREAST BIOPSY     Isaiah Blakes  . CARDIAC CATHETERIZATION     10/2013  . LEFT HEART CATHETERIZATION WITH CORONARY ANGIOGRAM N/A 10/25/2013   Procedure: LEFT HEART CATHETERIZATION WITH CORONARY ANGIOGRAM;  Surgeon: Troy Sine, MD;  Location: [facility] CATH LAB;  Service: Cardiovascular;  Laterality: N/A;  . NECK SURGERY     x's 2  . TOTAL KNEE ARTHROPLASTY     Bilateral x's 2  . VAGINAL HYSTERECTOMY  10/17/1998   Dory Horn    Family History  Problem Relation Age of Onset  . Arthritis Unknown   . Colon cancer Father   . Prostate cancer Father   . HIV Brother        19  . Kidney cancer Brother   . Colon cancer Brother   . Lung cancer Brother   . Other Brother        Mouth Cancer  . Ovarian cancer Mother   . Uterine cancer Mother   . Heart  disease Maternal Grandmother   . Stroke Maternal Grandmother   . Hypertension Maternal Grandmother   . Diabetes Maternal Grandmother     Social History   Socioeconomic History  . Marital status: Married    Spouse name: [name]  . Number of children: 3  . Years of education: [employer]  . Highest education level: Not on file  Social Needs  . Financial resource strain: Not on file  . Food insecurity - worry: Not on file  . Food insecurity - inability: Not on file  . Transportation needs - medical: Not on file  . Transportation needs - non-medical: Not on file  Occupational History  . Occupation: DISABLED    Employer: UNEMPLOYED  Tobacco Use  . Smoking status: Former Smoker    Years: 2.00    Last attempt to quit: 04/06/1969    Years since quitting: 48.2  . Smokeless tobacco: Never Used  . Tobacco comment: socially smoked x 2 years  Substance and Sexual Activity  . Alcohol use: No    Alcohol/week: 0.0 oz  . Drug use: No  . Sexual activity: Not on file  Other Topics Concern  . Not  on file  Social History Narrative   Lives with husband [name]   Caffeine use: coffee (2 cups per day)   Mostly right-handed    Outpatient Medications Prior to Visit  Medication Sig Dispense Refill  . Ascorbic Acid (VITAMIN C PO) Vitamin C 500mg -Take 2-4 capsules by mouth daily.    . Biotin (RA BIOTIN) 1000 MCG tablet Take 1,000 mcg by mouth 2 (two) times daily.     Marland Kitchen BREWERS YEAST PO Take 2 tablets by mouth 2 (two) times daily.     . busPIRone (BUSPAR) 15 MG tablet Take 1 tablet (15 mg total) by mouth 2 (two) times daily. 60 tablet 3  . clopidogrel (PLAVIX) 75 MG tablet TAKE 1 TABLET BY MOUTH EVERY DAY 90 tablet 1  . dicyclomine (BENTYL) 20 MG tablet TAKE 1 TABLET BY MOUTH FOUR TIMES A DAY AS NEEDED 360 tablet 0  . doxycycline (VIBRA-TABS) 100 MG tablet Take 1 tablet (100 mg total) by mouth 2 (two) times daily. 20 tablet 0  . estradiol (ESTRACE) 2 MG tablet Take 2-3 mg by mouth daily.   11  .  FOLIC ACID PO Folic Acid 50mg -Take 1 tablet by mouth daily.    . furosemide (LASIX) 40 MG tablet Take 1 tablet (40 mg total) by mouth 2 (two) times daily. 180 tablet 1  . lactulose, encephalopathy, (CHRONULAC) 10 GM/15ML SOLN TAKE 30 MLS (20 G TOTAL) BY MOUTH EVERY 6 (SIX) HOURS AS NEEDED. 1892 mL 1  . Multiple Vitamin (MULTIVITAMIN) tablet Take 1 tablet by mouth daily.    Marland Kitchen OVER THE COUNTER MEDICATION Omega-3 Fatty acids-Vitamin E (fish oil) 1 capsule by mouth daily.    . potassium chloride SA (K-DUR,KLOR-CON) 20 MEQ tablet Take 1 tablet (20 mEq total) by mouth 2 (two) times daily. 180 tablet 2  . SYNTHROID 112 MCG tablet TAKE 1 TABLET BY MOUTH EVERY DAY BEFORE BREAKFAST 90 tablet 1  . vitamin D, CHOLECALCIFEROL, 400 UNITS tablet Take 400 Units by mouth 2 (two) times daily.      No facility-administered medications prior to visit.     Allergies  Allergen Reactions  . Prednisone Other (See Comments)    Changed personality   . Atrovent Hfa [Ipratropium Bromide Hfa] Other (See Comments)    Pt could not sleep  . Cheratussin Ac [Guaifenesin-Codeine]   . Hydrocodone Itching  . Influenza Vaccines Other (See Comments)    Pt reports heart attack after last flu shot  . Motrin [Ibuprofen] Other (See Comments)    "Gives false reading in blood"    ROS     Objective:    Physical Exam  There were no vitals taken for this visit. Wt Readings from Last 3 Encounters:  05/07/17 139 lb 3.2 oz (63.1 kg)  04/30/17 141 lb 3.2 oz (64 kg)  04/22/17 142 lb (64.4 kg)   BP Readings from Last 3 Encounters:  05/07/17 126/80  04/30/17 130/68  04/22/17 130/88     Immunization History  Administered Date(s) Administered  . Td 04/06/2013  . Tdap 04/06/1998    Health Maintenance  Topic Date Due  . DEXA SCAN  12/15/2009  . PNA vac Low Risk Adult (1 of 2 - PCV13) 12/15/2009  . MAMMOGRAM  09/11/2017  . COLONOSCOPY  05/26/2022  . TETANUS/TDAP  04/07/2023  . Hepatitis C Screening  Completed    Lab  Results  Component Value Date   WBC 4.6 11/30/2016   HGB 14.4 11/30/2016   HCT 42.6 11/30/2016   PLT  208.0 11/30/2016   GLUCOSE 87 05/03/2017   CHOL 190 05/03/2017   TRIG 58.0 05/03/2017   HDL 75.50 05/03/2017   LDLCALC 102 (H) 05/03/2017   ALT 22 05/03/2017   AST 23 05/03/2017   NA 143 05/03/2017   K 3.3 (L) 05/03/2017   CL 100 05/03/2017   CREATININE 0.83 05/03/2017   BUN 12 05/03/2017   CO2 32 05/03/2017   TSH 1.33 04/08/2017   INR 1.1 (H) 05/07/2015    Lab Results  Component Value Date   TSH 1.33 04/08/2017   Lab Results  Component Value Date   WBC 4.6 11/30/2016   HGB 14.4 11/30/2016   HCT 42.6 11/30/2016   MCV 97.8 11/30/2016   PLT 208.0 11/30/2016   Lab Results  Component Value Date   NA 143 05/03/2017   K 3.3 (L) 05/03/2017   CO2 32 05/03/2017   GLUCOSE 87 05/03/2017   BUN 12 05/03/2017   CREATININE 0.83 05/03/2017   BILITOT 0.7 05/03/2017   ALKPHOS 61 05/03/2017   AST 23 05/03/2017   ALT 22 05/03/2017   PROT 7.5 05/03/2017   ALBUMIN 4.2 05/03/2017   CALCIUM 9.2 05/03/2017   ANIONGAP 14 04/09/2016   GFR 71.75 05/03/2017   Lab Results  Component Value Date   CHOL 190 05/03/2017   Lab Results  Component Value Date   HDL 75.50 05/03/2017   Lab Results  Component Value Date   LDLCALC 102 (H) 05/03/2017   Lab Results  Component Value Date   TRIG 58.0 05/03/2017   Lab Results  Component Value Date   CHOLHDL 3 05/03/2017   No results found for: HGBA1C       Assessment & Plan:   Problem List Items Addressed This Visit    None      I am having Alexandria [name] maintain her vitamin D (CHOLECALCIFEROL), BREWERS YEAST PO, multivitamin, Biotin, estradiol, Ascorbic Acid (VITAMIN C PO), FOLIC ACID PO, OVER THE COUNTER MEDICATION, potassium chloride SA, SYNTHROID, furosemide, clopidogrel, lactulose (encephalopathy), doxycycline, busPIRone, and dicyclomine.  No orders of the defined types were placed in this encounter.   {PROVIDER  TO DELETE} Bartholome Bill, RMA

## 2017-06-18 NOTE — Patient Instructions (Signed)
Diverticulitis °Diverticulitis is infection or inflammation of small pouches (diverticula) in the colon that form due to a condition called diverticulosis. Diverticula can trap stool (feces) and bacteria, causing infection and inflammation. °Diverticulitis may cause severe stomach pain and diarrhea. It may lead to tissue damage in the colon that causes bleeding. The diverticula may also burst (rupture) and cause infected stool to enter other areas of the abdomen. °Complications of diverticulitis can include: °· Bleeding. °· Severe infection. °· Severe pain. °· Rupture (perforation) of the colon. °· Blockage (obstruction) of the colon. ° °What are the causes? °This condition is caused by stool becoming trapped in the diverticula, which allows bacteria to grow in the diverticula. This leads to inflammation and infection. °What increases the risk? °You are more likely to develop this condition if: °· You have diverticulosis. The risk for diverticulosis increases if: °? You are overweight or obese. °? You use tobacco products. °? You do not get enough exercise. °· You eat a diet that does not include enough fiber. High-fiber foods include fruits, vegetables, beans, nuts, and whole grains. ° °What are the signs or symptoms? °Symptoms of this condition may include: °· Pain and tenderness in the abdomen. The pain is normally located on the left side of the abdomen, but it may occur in other areas. °· Fever and chills. °· Bloating. °· Cramping. °· Nausea. °· Vomiting. °· Changes in bowel routines. °· Blood in your stool. ° °How is this diagnosed? °This condition is diagnosed based on: °· Your medical history. °· A physical exam. °· Tests to make sure there is nothing else causing your condition. These tests may include: °? Blood tests. °? Urine tests. °? Imaging tests of the abdomen, including X-rays, ultrasounds, MRIs, or CT scans. ° °How is this treated? °Most cases of this condition are mild and can be treated at home.  Treatment may include: °· Taking over-the-counter pain medicines. °· Following a clear liquid diet. °· Taking antibiotic medicines by mouth. °· Rest. ° °More severe cases may need to be treated at a hospital. Treatment may include: °· Not eating or drinking. °· Taking prescription pain medicine. °· Receiving antibiotic medicines through an IV tube. °· Receiving fluids and nutrition through an IV tube. °· Surgery. ° °When your condition is under control, your health care provider may recommend that you have a colonoscopy. This is an exam to look at the entire large intestine. During the exam, a lubricated, bendable tube is inserted into the anus and then passed into the rectum, colon, and other parts of the large intestine. A colonoscopy can show how severe your diverticula are and whether something else may be causing your symptoms. °Follow these instructions at home: °Medicines °· Take over-the-counter and prescription medicines only as told by your health care provider. These include fiber supplements, probiotics, and stool softeners. °· If you were prescribed an antibiotic medicine, take it as told by your health care provider. Do not stop taking the antibiotic even if you start to feel better. °· Do not drive or use heavy machinery while taking prescription pain medicine. °General instructions °· Follow a full liquid diet or another diet as directed by your health care provider. After your symptoms improve, your health care provider may tell you to change your diet. He or she may recommend that you eat a diet that contains at least 25 g (25 grams) of fiber daily. Fiber makes it easier to pass stool. Healthy sources of fiber include: °? Berries. One cup   contains 4-8 grams of fiber. °? Beans or lentils. One half cup contains 5-8 grams of fiber. °? Green vegetables. One cup contains 4 grams of fiber. °· Exercise for at least 30 minutes, 3 times each week. You should exercise hard enough to raise your heart rate and  break a sweat. °· Keep all follow-up visits as told by your health care provider. This is important. You may need a colonoscopy. °Contact a health care provider if: °· Your pain does not improve. °· You have a hard time drinking or eating food. °· Your bowel movements do not return to normal. °Get help right away if: °· Your pain gets worse. °· Your symptoms do not get better with treatment. °· Your symptoms suddenly get worse. °· You have a fever. °· You vomit more than one time. °· You have stools that are bloody, black, or tarry. °Summary °· Diverticulitis is infection or inflammation of small pouches (diverticula) in the colon that form due to a condition called diverticulosis. Diverticula can trap stool (feces) and bacteria, causing infection and inflammation. °· You are at higher risk for this condition if you have diverticulosis and you eat a diet that does not include enough fiber. °· Most cases of this condition are mild and can be treated at home. More severe cases may need to be treated at a hospital. °· When your condition is under control, your health care provider may recommend that you have an exam called a colonoscopy. This exam can show how severe your diverticula are and whether something else may be causing your symptoms. °This information is not intended to replace advice given to you by your health care provider. Make sure you discuss any questions you have with your health care provider. °Document Released: 12/31/2004 Document Revised: 04/25/2016 Document Reviewed: 04/25/2016 °Elsevier Interactive Patient Education © 2018 [employer] ° °

## 2017-06-18 NOTE — Progress Notes (Signed)
Subjective:    Patient ID: Alexandria [name], female    DOB: 12-Jan-1946, 72 y.o.   MRN: [id number]  HPI  Patient here for f/u abd pain ----pt was seen in UC and given flagyl and augmentin.  Pt had to stop the flagyl  Past Medical History:  Diagnosis Date  . Arthritis   . Asthmatic bronchitis with acute exacerbation 02/06/2015  . Bronchial pneumonia   . Colon polyp   . Diverticulitis   . DVT (deep venous thrombosis) ([place])    lower extremity  . H/O cardiac catheterization 2004   Normal coronary arteries  . Heart murmur   . History of stress test 05/21/2011  . Hx of echocardiogram 01/27/2010   Normal Ef 55% the transmitral spectral doppler flow pattern is normal for age. the left ventricular wall motion is normal  . Migraines   . Thyroid disease   . TIA (transient ischemic attack)    8 years ago    Review of Systems  Constitutional: Negative for activity change, appetite change, fatigue and unexpected weight change.  Respiratory: Negative for cough and shortness of breath.   Cardiovascular: Negative for chest pain and palpitations.  Gastrointestinal: Positive for abdominal pain and nausea. Negative for abdominal distention, blood in stool, constipation, diarrhea and vomiting.  Psychiatric/Behavioral: Negative for behavioral problems and dysphoric mood. The patient is not nervous/anxious.        Objective:    Physical Exam  Constitutional: She is oriented to person, place, and time. She appears well-developed and well-nourished.  HENT:  Head: Normocephalic and atraumatic.  Right Ear: External ear normal.  Left Ear: External ear normal.  + PND + errythema  Eyes: Conjunctivae and EOM are normal. Right eye exhibits no discharge. Left eye exhibits no discharge.  Neck: Normal range of motion. Neck supple. No JVD present. Carotid bruit is not present. No thyromegaly present.  Cardiovascular: Normal rate and regular rhythm.  Murmur heard. Pulmonary/Chest: Effort normal  and breath sounds normal. No respiratory distress. She has no wheezes. She has no rales. She exhibits no tenderness.  Abdominal: Soft. There is tenderness. There is no rebound and no guarding.    Musculoskeletal: She exhibits no edema.  Lymphadenopathy:    She has no cervical adenopathy.  Neurological: She is alert and oriented to person, place, and time.  Psychiatric: She has a normal mood and affect.  Nursing note and vitals reviewed.   BP 124/78 (BP Location: Left Arm, Patient Position: Sitting, Cuff Size: Normal)   Pulse 83   Temp 98.5 F (36.9 C) (Oral)   Resp 16   Ht 5' 4.17" (1.63 m)   Wt 141 lb 6.4 oz (64.1 kg)   SpO2 97%   BMI 24.14 kg/m  Wt Readings from Last 3 Encounters:  06/18/17 141 lb 6.4 oz (64.1 kg)  05/07/17 139 lb 3.2 oz (63.1 kg)  04/30/17 141 lb 3.2 oz (64 kg)     Lab Results  Component Value Date   WBC 4.6 11/30/2016   HGB 14.4 11/30/2016   HCT 42.6 11/30/2016   PLT 208.0 11/30/2016   GLUCOSE 87 05/03/2017   CHOL 190 05/03/2017   TRIG 58.0 05/03/2017   HDL 75.50 05/03/2017   LDLCALC 102 (H) 05/03/2017   ALT 22 05/03/2017   AST 23 05/03/2017   NA 143 05/03/2017   K 3.3 (L) 05/03/2017   CL 100 05/03/2017   CREATININE 0.83 05/03/2017   BUN 12 05/03/2017   CO2 32 05/03/2017   TSH  1.33 04/08/2017   INR 1.1 (H) 05/07/2015    No results found.     Assessment & Plan:   Problem List Items Addressed This Visit      Unprioritized   Cardiac murmur    Echo results reveiwed with pt       Other Visit Diagnoses    Generalized abdominal pain    -  Primary   Relevant Orders   Ambulatory referral to Gastroenterology                       Take zantac 150 mg bid and f/u GI at [place], DO

## 2017-06-21 ENCOUNTER — Other Ambulatory Visit: Payer: Self-pay | Admitting: Family Medicine

## 2017-06-21 DIAGNOSIS — E876 Hypokalemia: Secondary | ICD-10-CM

## 2017-07-01 ENCOUNTER — Telehealth: Payer: Self-pay | Admitting: Family Medicine

## 2017-07-01 NOTE — Telephone Encounter (Signed)
Copied from [place] [id number]. Topic: Quick Communication - See Telephone Encounter >> Jul 01, 2017 11:32 AM Cleaster Corin, NT wrote: CRM for notification. See Telephone encounter for: 07/01/17.   Pt. Calling to check on status of referral. Pt. stated that it has been a week and she hasn't heard anything as of yet. Pt. Cb# [phone]

## 2017-07-19 DIAGNOSIS — H16223 Keratoconjunctivitis sicca, not specified as Sjogren's, bilateral: Secondary | ICD-10-CM | POA: Diagnosis not present

## 2017-07-19 DIAGNOSIS — H01009 Unspecified blepharitis unspecified eye, unspecified eyelid: Secondary | ICD-10-CM | POA: Diagnosis not present

## 2017-07-27 DIAGNOSIS — Z85828 Personal history of other malignant neoplasm of skin: Secondary | ICD-10-CM | POA: Diagnosis not present

## 2017-07-27 DIAGNOSIS — L821 Other seborrheic keratosis: Secondary | ICD-10-CM | POA: Diagnosis not present

## 2017-07-27 DIAGNOSIS — L570 Actinic keratosis: Secondary | ICD-10-CM | POA: Diagnosis not present

## 2017-07-30 ENCOUNTER — Other Ambulatory Visit: Payer: Self-pay | Admitting: Family Medicine

## 2017-08-03 NOTE — Telephone Encounter (Signed)
Pt is calling checking on med refill. Pt is out of the medication.

## 2017-08-03 NOTE — Telephone Encounter (Signed)
Left message on machine that rx was sent in.  

## 2017-08-11 DIAGNOSIS — H16223 Keratoconjunctivitis sicca, not specified as Sjogren's, bilateral: Secondary | ICD-10-CM | POA: Diagnosis not present

## 2017-08-11 DIAGNOSIS — H01009 Unspecified blepharitis unspecified eye, unspecified eyelid: Secondary | ICD-10-CM | POA: Diagnosis not present

## 2017-08-20 DIAGNOSIS — R309 Painful micturition, unspecified: Secondary | ICD-10-CM | POA: Diagnosis not present

## 2017-08-24 ENCOUNTER — Encounter: Payer: Self-pay | Admitting: Family Medicine

## 2017-08-24 ENCOUNTER — Ambulatory Visit (INDEPENDENT_AMBULATORY_CARE_PROVIDER_SITE_OTHER): Payer: Medicare Other | Admitting: Family Medicine

## 2017-08-24 VITALS — BP 110/86 | HR 80 | Temp 98.3°F | Resp 16 | Ht 64.0 in | Wt 141.8 lb

## 2017-08-24 DIAGNOSIS — R1084 Generalized abdominal pain: Secondary | ICD-10-CM

## 2017-08-24 LAB — COMPREHENSIVE METABOLIC PANEL
ALBUMIN: 3.7 g/dL (ref 3.5–5.2)
ALK PHOS: 70 U/L (ref 39–117)
ALT: 15 U/L (ref 0–35)
AST: 15 U/L (ref 0–37)
BILIRUBIN TOTAL: 0.5 mg/dL (ref 0.2–1.2)
BUN: 7 mg/dL (ref 6–23)
CALCIUM: 9 mg/dL (ref 8.4–10.5)
CO2: 32 mEq/L (ref 19–32)
Chloride: 99 mEq/L (ref 96–112)
Creatinine, Ser: 0.69 mg/dL (ref 0.40–1.20)
GFR: 88.72 mL/min (ref >60.00)
GLUCOSE: 94 mg/dL (ref 70–99)
POTASSIUM: 3.5 meq/L (ref 3.5–5.1)
Sodium: 139 mEq/L (ref 135–145)
TOTAL PROTEIN: 6.9 g/dL (ref 6.0–8.3)

## 2017-08-24 LAB — CBC WITH DIFFERENTIAL/PLATELET
BASOS PCT: 1.3 % (ref 0.0–3.0)
Basophils Absolute: 0.1 10*3/uL (ref 0.0–0.1)
EOS PCT: 4.6 % (ref 0.0–5.0)
Eosinophils Absolute: 0.2 10*3/uL (ref 0.0–0.7)
HCT: 39.2 % (ref 36.0–46.0)
Hemoglobin: 13.6 g/dL (ref 12.0–15.0)
LYMPHS ABS: 1.3 10*3/uL (ref 0.7–4.0)
Lymphocytes Relative: 28.6 % (ref 12.0–46.0)
MCHC: 34.8 g/dL (ref 30.0–36.0)
MCV: 95.1 fl (ref 78.0–100.0)
Monocytes Absolute: 0.7 10*3/uL (ref 0.1–1.0)
Monocytes Relative: 15.2 % — ABNORMAL HIGH (ref 3.0–12.0)
NEUTROS ABS: 2.4 10*3/uL (ref 1.4–7.7)
NEUTROS PCT: 50.3 % (ref 43.0–77.0)
PLATELETS: 255 10*3/uL (ref 150.0–400.0)
RBC: 4.12 Mil/uL (ref 3.87–5.11)
RDW: 13 % (ref 11.5–15.5)
WBC: 4.7 10*3/uL (ref 4.0–10.5)

## 2017-08-24 LAB — POC URINALSYSI DIPSTICK (AUTOMATED)
Bilirubin, UA: NEGATIVE
Glucose, UA: NEGATIVE
Ketones, UA: NEGATIVE
Leukocytes, UA: NEGATIVE
Nitrite, UA: NEGATIVE
PH UA: 6 (ref 5.0–8.0)
PROTEIN UA: NEGATIVE
RBC UA: NEGATIVE
Urobilinogen, UA: 0.2 E.U./dL

## 2017-08-24 LAB — AMYLASE: AMYLASE: 28 U/L (ref 27–131)

## 2017-08-24 LAB — H. PYLORI ANTIBODY, IGG: H PYLORI IGG: NEGATIVE

## 2017-08-24 LAB — LIPASE: LIPASE: 14 U/L (ref 11.0–59.0)

## 2017-08-24 NOTE — Progress Notes (Signed)
Patient ID: Alexandria [name], female    DOB: 1946-03-02  Age: 72 y.o. MRN: [id number]    Subjective:  Subjective  HPI Alexandria [name] presents for f/u uc for abd pain.  She was tx with augmentin for possible diverticulitis.  She is no better -- pain is no worse but no better.  She still has the back pain as well No fevers   Review of Systems  Constitutional: Negative for appetite change, diaphoresis, fatigue and unexpected weight change.  Eyes: Negative for pain, redness and visual disturbance.  Respiratory: Negative for cough, chest tightness, shortness of breath and wheezing.   Cardiovascular: Negative for chest pain, palpitations and leg swelling.  Gastrointestinal: Positive for abdominal pain and diarrhea. Negative for anal bleeding, blood in stool, constipation, nausea, rectal pain and vomiting.  Endocrine: Negative for cold intolerance, heat intolerance, polydipsia, polyphagia and polyuria.  Genitourinary: Negative for difficulty urinating, dysuria and frequency.  Neurological: Negative for dizziness, light-headedness, numbness and headaches.    History Past Medical History:  Diagnosis Date  . Arthritis   . Asthmatic bronchitis with acute exacerbation 02/06/2015  . Bronchial pneumonia   . Colon polyp   . Diverticulitis   . DVT (deep venous thrombosis) ([place])    lower extremity  . H/O cardiac catheterization 2004   Normal coronary arteries  . Heart murmur   . History of stress test 05/21/2011  . Hx of echocardiogram 01/27/2010   Normal Ef 55% the transmitral spectral doppler flow pattern is normal for age. the left ventricular wall motion is normal  . Migraines   . Thyroid disease   . TIA (transient ischemic attack)    8 years ago    She has a past surgical history that includes Vaginal hysterectomy (10/17/1998); Breast biopsy; Total knee arthroplasty; Neck surgery; Cardiac catheterization; and left heart catheterization with coronary angiogram (N/A, 10/25/2013).    Her family history includes Arthritis in her unknown relative; Colon cancer in her brother and father; Diabetes in her maternal grandmother; HIV in her brother; Heart disease in her maternal grandmother; Hypertension in her maternal grandmother; Kidney cancer in her brother; Lung cancer in her brother; Other in her brother; Ovarian cancer in her mother; Prostate cancer in her father; Stroke in her maternal grandmother; Uterine cancer in her mother.She reports that she quit smoking about 48 years ago. She quit after 2.00 years of use. She has never used smokeless tobacco. She reports that she does not drink alcohol or use drugs.  Current Outpatient Medications on File Prior to Visit  Medication Sig Dispense Refill  . amoxicillin-clavulanate (AUGMENTIN) 875-125 MG tablet Take 1 tablet by mouth 2 (two) times daily.    . Ascorbic Acid (VITAMIN C PO) Vitamin C 500mg -Take 2-4 capsules by mouth daily.    . Biotin (RA BIOTIN) 1000 MCG tablet Take 1,000 mcg by mouth 2 (two) times daily.     Marland Kitchen BREWERS YEAST PO Take 2 tablets by mouth 2 (two) times daily.     . busPIRone (BUSPAR) 15 MG tablet Take 1 tablet (15 mg total) by mouth 2 (two) times daily. 60 tablet 3  . clopidogrel (PLAVIX) 75 MG tablet TAKE 1 TABLET BY MOUTH EVERY DAY 90 tablet 1  . dicyclomine (BENTYL) 20 MG tablet TAKE 1 TABLET BY MOUTH FOUR TIMES A DAY AS NEEDED 360 tablet 0  . doxycycline (VIBRA-TABS) 100 MG tablet Take 1 tablet (100 mg total) by mouth 2 (two) times daily. 20 tablet 0  . estradiol (ESTRACE) 2 MG  tablet Take 2-3 mg by mouth daily.   11  . FOLIC ACID PO Folic Acid 50mg -Take 1 tablet by mouth daily.    . furosemide (LASIX) 40 MG tablet Take 1 tablet (40 mg total) by mouth 2 (two) times daily. 180 tablet 1  . KLOR-CON M20 20 MEQ tablet TAKE 1 TABLET BY MOUTH TWICE A DAY 180 tablet 2  . lactulose, encephalopathy, (CHRONULAC) 10 GM/15ML SOLN TAKE 30 MLS (20 G TOTAL) BY MOUTH EVERY 6 (SIX) HOURS AS NEEDED. 1892 mL 1  . Multiple  Vitamin (MULTIVITAMIN) tablet Take 1 tablet by mouth daily.    Marland Kitchen OVER THE COUNTER MEDICATION Omega-3 Fatty acids-Vitamin E (fish oil) 1 capsule by mouth daily.    Marland Kitchen SYNTHROID 112 MCG tablet TAKE 1 TABLET BY MOUTH EVERY DAY BEFORE BREAKFAST 90 tablet 1  . vitamin D, CHOLECALCIFEROL, 400 UNITS tablet Take 400 Units by mouth 2 (two) times daily.      No current facility-administered medications on file prior to visit.      Objective:  Objective  Physical Exam  Constitutional: She is oriented to person, place, and time. She appears well-developed and well-nourished.  HENT:  Head: Normocephalic and atraumatic.  Eyes: Conjunctivae and EOM are normal.  Neck: Normal range of motion. Neck supple. No JVD present. Carotid bruit is not present. No thyromegaly present.  Cardiovascular: Normal rate, regular rhythm and normal heart sounds.  No murmur heard. Pulmonary/Chest: Effort normal and breath sounds normal. No respiratory distress. She has no wheezes. She has no rales. She exhibits no tenderness.  Abdominal: Soft. There is generalized tenderness. There is guarding and CVA tenderness. There is no rebound, no tenderness at McBurney's point and negative Murphy's sign. No hernia.  Musculoskeletal: She exhibits no edema.  Neurological: She is alert and oriented to person, place, and time.  Psychiatric: She has a normal mood and affect.  Nursing note and vitals reviewed.  BP 110/86 (BP Location: Right Arm, Cuff Size: Normal)   Pulse 80   Temp 98.3 F (36.8 C) (Oral)   Resp 16   Ht 5\' 4"  (1.626 m)   Wt 141 lb 12.8 oz (64.3 kg)   SpO2 98%   BMI 24.34 kg/m  Wt Readings from Last 3 Encounters:  08/24/17 141 lb 12.8 oz (64.3 kg)  06/18/17 141 lb 6.4 oz (64.1 kg)  05/07/17 139 lb 3.2 oz (63.1 kg)     Lab Results  Component Value Date   WBC 4.6 11/30/2016   HGB 14.4 11/30/2016   HCT 42.6 11/30/2016   PLT 208.0 11/30/2016   GLUCOSE 87 05/03/2017   CHOL 190 05/03/2017   TRIG 58.0 05/03/2017    HDL 75.50 05/03/2017   LDLCALC 102 (H) 05/03/2017   ALT 22 05/03/2017   AST 23 05/03/2017   NA 143 05/03/2017   K 3.3 (L) 05/03/2017   CL 100 05/03/2017   CREATININE 0.83 05/03/2017   BUN 12 05/03/2017   CO2 32 05/03/2017   TSH 1.33 04/08/2017   INR 1.1 (H) 05/07/2015    No results found.   Assessment & Plan:  Plan  I am having Alexandria [name] maintain her vitamin D (CHOLECALCIFEROL), BREWERS YEAST PO, multivitamin, Biotin, estradiol, Ascorbic Acid (VITAMIN C PO), FOLIC ACID PO, OVER THE COUNTER MEDICATION, SYNTHROID, furosemide, clopidogrel, lactulose (encephalopathy), doxycycline, busPIRone, KLOR-CON M20, dicyclomine, and amoxicillin-clavulanate.  No orders of the defined types were placed in this encounter.   Problem List Items Addressed This Visit    None  Visit Diagnoses    Generalized abdominal pain    -  Primary   Relevant Orders   CBC with Differential/Platelet   H. pylori antibody, IgG   Amylase   Lipase   POCT Urinalysis Dipstick (Automated)   CT Abdomen Pelvis W Contrast   Comprehensive metabolic panel    ct abd -- if pain worsens -- go to er Check labs Pt with hx pancreatitis and diverticulitis   Follow-up: Return if symptoms worsen or fail to improve.  [name], DO

## 2017-08-24 NOTE — Patient Instructions (Signed)

## 2017-08-25 ENCOUNTER — Other Ambulatory Visit: Payer: Self-pay | Admitting: Family Medicine

## 2017-08-25 ENCOUNTER — Ambulatory Visit (HOSPITAL_BASED_OUTPATIENT_CLINIC_OR_DEPARTMENT_OTHER)
Admission: RE | Admit: 2017-08-25 | Discharge: 2017-08-25 | Disposition: A | Discharge status: Home / Self Care | Payer: Medicare Other | Source: Ambulatory Visit | Attending: Family Medicine | Admitting: Family Medicine

## 2017-08-25 DIAGNOSIS — R1084 Generalized abdominal pain: Secondary | ICD-10-CM | POA: Diagnosis not present

## 2017-08-25 DIAGNOSIS — K5792 Diverticulitis of intestine, part unspecified, without perforation or abscess without bleeding: Secondary | ICD-10-CM

## 2017-08-25 DIAGNOSIS — H5231 Anisometropia: Secondary | ICD-10-CM | POA: Diagnosis not present

## 2017-08-25 DIAGNOSIS — H16223 Keratoconjunctivitis sicca, not specified as Sjogren's, bilateral: Secondary | ICD-10-CM | POA: Diagnosis not present

## 2017-08-25 DIAGNOSIS — R109 Unspecified abdominal pain: Secondary | ICD-10-CM | POA: Diagnosis not present

## 2017-08-25 MED ORDER — METRONIDAZOLE 500 MG PO TABS: 500.0000 mg | ORAL_TABLET | Freq: Three times a day (TID) | ORAL | 0 refills | Status: AC

## 2017-08-25 MED ORDER — CIPROFLOXACIN HCL 500 MG PO TABS: 500.0000 mg | ORAL_TABLET | Freq: Two times a day (BID) | ORAL | 0 refills | Status: DC

## 2017-08-25 MED ORDER — IOPAMIDOL (ISOVUE-300) INJECTION 61%
100.0000 mL | Freq: Once | INTRAVENOUS | Status: AC | PRN
  Administered 2017-08-25: 100 mL via INTRAVENOUS

## 2017-08-27 ENCOUNTER — Telehealth: Payer: Self-pay | Admitting: Family Medicine

## 2017-08-27 NOTE — Telephone Encounter (Signed)
Copied from [place] [id number]. Topic: Quick Communication - See Telephone Encounter >> Aug 27, 2017 12:52 PM Rosalin Hawking wrote: CRM for notification. See Telephone encounter for: 08/27/17.   Pt came in office stating is needing a copy of her last CT results sent to Dr Joyce Gross and her colon results  (Dr Joyce Gross Tel [phone]- pt did not have there fax number). Pt states has an appt with Dr on June 25 but pt is wanting to move her appt with Dr Joyce Gross sooner and the office requested to received her copy of her last CT that was done, so that she can get her appt changed. If any question pt stated can call her at her cell phone that is on file.  Please advise.

## 2017-08-27 NOTE — Telephone Encounter (Signed)
Pt called back to inform that Dr Joyce Gross changed pt's appt for September 14, 2017. Pt would like to know if documents can be faxed to their office ASAP.

## 2017-08-31 NOTE — Telephone Encounter (Signed)
CT scan sent to Dr. Joyce Gross office [phone]

## 2017-09-10 ENCOUNTER — Telehealth: Payer: Self-pay | Admitting: *Deleted

## 2017-09-10 NOTE — Telephone Encounter (Signed)
Received Medical/Surgical Clearance Form from [facility] and [place] for Cataract extraction/Intraocular lenses implant, both eyes; forwarded to provider/SLS 06/07

## 2017-09-13 ENCOUNTER — Other Ambulatory Visit: Payer: Self-pay | Admitting: Family Medicine

## 2017-09-13 DIAGNOSIS — K5792 Diverticulitis of intestine, part unspecified, without perforation or abscess without bleeding: Secondary | ICD-10-CM | POA: Diagnosis not present

## 2017-09-13 DIAGNOSIS — R109 Unspecified abdominal pain: Secondary | ICD-10-CM | POA: Diagnosis not present

## 2017-09-13 DIAGNOSIS — K5790 Diverticulosis of intestine, part unspecified, without perforation or abscess without bleeding: Secondary | ICD-10-CM | POA: Diagnosis not present

## 2017-09-14 ENCOUNTER — Telehealth: Payer: Self-pay | Admitting: *Deleted

## 2017-09-14 NOTE — Telephone Encounter (Signed)
Received paperwork from [facility]; forwarded to provider.  Conversation  Data processing manager First)  Me    09/10/17 5:14 PM  Note    Received Medical/Surgical Clearance Form from [facility] and [place] for Cataract extraction/Intraocular lenses implant, both eyes; forwarded to provider/SLS 06/07      This was what I received from the Eye Doctor/SLS 06/11

## 2017-09-14 NOTE — Telephone Encounter (Signed)
Copied from [place] [phone]. Topic: General - Other >> Sep 14, 2017  9:55 AM Yvette Rack wrote: Reason for CRM: Pt states her GI doctor requests documentation from PCP that it is ok for pt to stop taking clopidogrel (PLAVIX) 75 MG tablet 5 days prior to colonoscopy scheduled on 10/01/17 at 11 am. Pt states that GI doctor will be sending request for this documentation. Pt also states that her Eye doctor is requesting an EKG and pt asked if the request was received. Cb# [phone].

## 2017-09-14 NOTE — Telephone Encounter (Signed)
Left message for patient to call back.  Form sent back to GI.  Need to ask about EKG.  It was not listed on the surgical clearance form to send.

## 2017-09-14 NOTE — Telephone Encounter (Signed)
Have you any paperwork about this?

## 2017-09-16 NOTE — Telephone Encounter (Signed)
Left another message to follow up.  Stating that I did not hear back so I am assuming every thing was ok and to call back or have her eye doctor send in paperwork stating that they need ekg

## 2017-09-20 DIAGNOSIS — Z1231 Encounter for screening mammogram for malignant neoplasm of breast: Secondary | ICD-10-CM | POA: Diagnosis not present

## 2017-09-20 DIAGNOSIS — Z803 Family history of malignant neoplasm of breast: Secondary | ICD-10-CM | POA: Diagnosis not present

## 2017-09-21 DIAGNOSIS — N760 Acute vaginitis: Secondary | ICD-10-CM | POA: Diagnosis not present

## 2017-09-21 DIAGNOSIS — Z01419 Encounter for gynecological examination (general) (routine) without abnormal findings: Secondary | ICD-10-CM | POA: Diagnosis not present

## 2017-09-21 DIAGNOSIS — Z6824 Body mass index (BMI) 24.0-24.9, adult: Secondary | ICD-10-CM | POA: Diagnosis not present

## 2017-09-24 DIAGNOSIS — K5730 Diverticulosis of large intestine without perforation or abscess without bleeding: Secondary | ICD-10-CM | POA: Diagnosis not present

## 2017-09-24 DIAGNOSIS — D120 Benign neoplasm of cecum: Secondary | ICD-10-CM | POA: Diagnosis not present

## 2017-09-24 DIAGNOSIS — K648 Other hemorrhoids: Secondary | ICD-10-CM | POA: Diagnosis not present

## 2017-09-24 DIAGNOSIS — D126 Benign neoplasm of colon, unspecified: Secondary | ICD-10-CM | POA: Diagnosis not present

## 2017-10-05 DIAGNOSIS — H2512 Age-related nuclear cataract, left eye: Secondary | ICD-10-CM | POA: Diagnosis not present

## 2017-10-05 DIAGNOSIS — Z961 Presence of intraocular lens: Secondary | ICD-10-CM | POA: Diagnosis not present

## 2017-10-05 DIAGNOSIS — H25812 Combined forms of age-related cataract, left eye: Secondary | ICD-10-CM | POA: Diagnosis not present

## 2017-10-09 ENCOUNTER — Other Ambulatory Visit: Payer: Self-pay | Admitting: Family Medicine

## 2017-10-21 ENCOUNTER — Other Ambulatory Visit: Payer: Self-pay | Admitting: Family Medicine

## 2017-11-03 DIAGNOSIS — R1012 Left upper quadrant pain: Secondary | ICD-10-CM | POA: Diagnosis not present

## 2017-11-10 DIAGNOSIS — H5711 Ocular pain, right eye: Secondary | ICD-10-CM | POA: Diagnosis not present

## 2017-11-16 ENCOUNTER — Telehealth: Payer: Self-pay | Admitting: Family Medicine

## 2017-11-16 NOTE — Telephone Encounter (Signed)
Patient will be in for follow up on 11/26/17

## 2017-11-16 NOTE — Telephone Encounter (Signed)
Copied from [place]. Topic: Quick Communication - See Telephone Encounter >> Nov 16, 2017 11:12 AM Bea Graff, NT wrote: CRM for notification. See Telephone encounter for: 11/16/17. Pt would like orders put in for her yearly lab work. She states that she usually has her thyroid and kidney levels checked. Please call when orders are placed

## 2017-11-26 ENCOUNTER — Ambulatory Visit (INDEPENDENT_AMBULATORY_CARE_PROVIDER_SITE_OTHER): Payer: Medicare Other | Admitting: Family Medicine

## 2017-11-26 ENCOUNTER — Encounter: Payer: Self-pay | Admitting: Family Medicine

## 2017-11-26 VITALS — BP 146/58 | HR 57 | Temp 98.1°F | Resp 16 | Ht 64.0 in | Wt 138.8 lb

## 2017-11-26 DIAGNOSIS — G4709 Other insomnia: Secondary | ICD-10-CM

## 2017-11-26 DIAGNOSIS — R053 Chronic cough: Secondary | ICD-10-CM

## 2017-11-26 DIAGNOSIS — E039 Hypothyroidism, unspecified: Secondary | ICD-10-CM | POA: Diagnosis not present

## 2017-11-26 DIAGNOSIS — R05 Cough: Secondary | ICD-10-CM

## 2017-11-26 DIAGNOSIS — I252 Old myocardial infarction: Secondary | ICD-10-CM | POA: Diagnosis not present

## 2017-11-26 LAB — COMPREHENSIVE METABOLIC PANEL
ALK PHOS: 74 U/L (ref 39–117)
ALT: 20 U/L (ref 0–35)
AST: 21 U/L (ref 0–37)
Albumin: 3.8 g/dL (ref 3.5–5.2)
BUN: 12 mg/dL (ref 6–23)
CO2: 34 meq/L — AB (ref 19–32)
Calcium: 9.5 mg/dL (ref 8.4–10.5)
Chloride: 100 mEq/L (ref 96–112)
Creatinine, Ser: 0.79 mg/dL (ref 0.40–1.20)
GFR: 75.83 mL/min (ref >60.00)
GLUCOSE: 92 mg/dL (ref 70–99)
POTASSIUM: 3.2 meq/L — AB (ref 3.5–5.1)
SODIUM: 139 meq/L (ref 135–145)
TOTAL PROTEIN: 6.6 g/dL (ref 6.0–8.3)
Total Bilirubin: 0.6 mg/dL (ref 0.2–1.2)

## 2017-11-26 LAB — LIPID PANEL
Cholesterol: 189 mg/dL (ref 0–200)
HDL: 61.6 mg/dL (ref >39.00)
LDL CALC: 117 mg/dL — AB (ref 0–99)
NONHDL: 126.92
Total CHOL/HDL Ratio: 3
Triglycerides: 51 mg/dL (ref 0.0–149.0)
VLDL: 10.2 mg/dL (ref 0.0–40.0)

## 2017-11-26 LAB — TSH: TSH: 0.62 u[IU]/mL (ref 0.35–4.50)

## 2017-11-26 MED ORDER — ALPRAZOLAM 0.25 MG PO TABS: 0.2500 mg | ORAL_TABLET | Freq: Every day | ORAL | 0 refills | Status: DC

## 2017-11-26 NOTE — Patient Instructions (Signed)
DASH Eating Plan DASH stands for "Dietary Approaches to Stop Hypertension." The DASH eating plan is a healthy eating plan that has been shown to reduce high blood pressure (hypertension). It may also reduce your risk for type 2 diabetes, heart disease, and stroke. The DASH eating plan may also help with weight loss. What are tips for following this plan? General guidelines  Avoid eating more than 2,300 mg (milligrams) of salt (sodium) a day. If you have hypertension, you may need to reduce your sodium intake to 1,500 mg a day.  Limit alcohol intake to no more than 1 drink a day for nonpregnant women and 2 drinks a day for men. One drink equals 12 oz of beer, 5 oz of wine, or 1 oz of hard liquor.  Work with your health care provider to maintain a healthy body weight or to lose weight. Ask what an ideal weight is for you.  Get at least 30 minutes of exercise that causes your heart to beat faster (aerobic exercise) most days of the week. Activities may include walking, swimming, or biking.  Work with your health care provider or diet and nutrition specialist (dietitian) to adjust your eating plan to your individual calorie needs. Reading food labels  Check food labels for the amount of sodium per serving. Choose foods with less than 5 percent of the Daily Value of sodium. Generally, foods with less than 300 mg of sodium per serving fit into this eating plan.  To find whole grains, look for the word "whole" as the first word in the ingredient list. Shopping  Buy products labeled as "low-sodium" or "no salt added."  Buy fresh foods. Avoid canned foods and premade or frozen meals. Cooking  Avoid adding salt when cooking. Use salt-free seasonings or herbs instead of table salt or sea salt. Check with your health care provider or pharmacist before using salt substitutes.  Do not fry foods. Cook foods using healthy methods such as baking, boiling, grilling, and broiling instead.  Cook with  heart-healthy oils, such as olive, canola, soybean, or sunflower oil. Meal planning   Eat a balanced diet that includes: ? 5 or more servings of fruits and vegetables each day. At each meal, try to fill half of your plate with fruits and vegetables. ? Up to 6-8 servings of whole grains each day. ? Less than 6 oz of lean meat, poultry, or fish each day. A 3-oz serving of meat is about the same size as a deck of cards. One egg equals 1 oz. ? 2 servings of low-fat dairy each day. ? A serving of nuts, seeds, or beans 5 times each week. ? Heart-healthy fats. Healthy fats called Omega-3 fatty acids are found in foods such as flaxseeds and coldwater fish, like sardines, salmon, and mackerel.  Limit how much you eat of the following: ? Canned or prepackaged foods. ? Food that is high in trans fat, such as fried foods. ? Food that is high in saturated fat, such as fatty meat. ? Sweets, desserts, sugary drinks, and other foods with added sugar. ? Full-fat dairy products.  Do not salt foods before eating.  Try to eat at least 2 vegetarian meals each week.  Eat more home-cooked food and less restaurant, buffet, and fast food.  When eating at a restaurant, ask that your food be prepared with less salt or no salt, if possible. What foods are recommended? The items listed may not be a complete list. Talk with your dietitian about what   dietary choices are best for you. Grains Whole-grain or whole-wheat bread. Whole-grain or whole-wheat pasta. Brown rice. Oatmeal. Quinoa. Bulgur. Whole-grain and low-sodium cereals. Pita bread. Low-fat, low-sodium crackers. Whole-wheat flour tortillas. Vegetables Fresh or frozen vegetables (raw, steamed, roasted, or grilled). Low-sodium or reduced-sodium tomato and vegetable juice. Low-sodium or reduced-sodium tomato sauce and tomato paste. Low-sodium or reduced-sodium canned vegetables. Fruits All fresh, dried, or frozen fruit. Canned fruit in natural juice (without  added sugar). Meat and other protein foods Skinless chicken or turkey. Ground chicken or turkey. Pork with fat trimmed off. Fish and seafood. Egg whites. Dried beans, peas, or lentils. Unsalted nuts, nut butters, and seeds. Unsalted canned beans. Lean cuts of beef with fat trimmed off. Low-sodium, lean deli meat. Dairy Low-fat (1%) or fat-free (skim) milk. Fat-free, low-fat, or reduced-fat cheeses. Nonfat, low-sodium ricotta or cottage cheese. Low-fat or nonfat yogurt. Low-fat, low-sodium cheese. Fats and oils Soft margarine without trans fats. Vegetable oil. Low-fat, reduced-fat, or light mayonnaise and salad dressings (reduced-sodium). Canola, safflower, olive, soybean, and sunflower oils. Avocado. Seasoning and other foods Herbs. Spices. Seasoning mixes without salt. Unsalted popcorn and pretzels. Fat-free sweets. What foods are not recommended? The items listed may not be a complete list. Talk with your dietitian about what dietary choices are best for you. Grains Baked goods made with fat, such as croissants, muffins, or some breads. Dry pasta or rice meal packs. Vegetables Creamed or fried vegetables. Vegetables in a cheese sauce. Regular canned vegetables (not low-sodium or reduced-sodium). Regular canned tomato sauce and paste (not low-sodium or reduced-sodium). Regular tomato and vegetable juice (not low-sodium or reduced-sodium). Pickles. Olives. Fruits Canned fruit in a light or heavy syrup. Fried fruit. Fruit in cream or butter sauce. Meat and other protein foods Fatty cuts of meat. Ribs. Fried meat. Bacon. Sausage. Bologna and other processed lunch meats. Salami. Fatback. Hotdogs. Bratwurst. Salted nuts and seeds. Canned beans with added salt. Canned or smoked fish. Whole eggs or egg yolks. Chicken or turkey with skin. Dairy Whole or 2% milk, cream, and half-and-half. Whole or full-fat cream cheese. Whole-fat or sweetened yogurt. Full-fat cheese. Nondairy creamers. Whipped toppings.  Processed cheese and cheese spreads. Fats and oils Butter. Stick margarine. Lard. Shortening. Ghee. Bacon fat. Tropical oils, such as coconut, palm kernel, or palm oil. Seasoning and other foods Salted popcorn and pretzels. Onion salt, garlic salt, seasoned salt, table salt, and sea salt. Worcestershire sauce. Tartar sauce. Barbecue sauce. Teriyaki sauce. Soy sauce, including reduced-sodium. Steak sauce. Canned and packaged gravies. Fish sauce. Oyster sauce. Cocktail sauce. Horseradish that you find on the shelf. Ketchup. Mustard. Meat flavorings and tenderizers. Bouillon cubes. Hot sauce and Tabasco sauce. Premade or packaged marinades. Premade or packaged taco seasonings. Relishes. Regular salad dressings. Where to find more information:  National Heart, Lung, and Blood Institute: www.nhlbi.nih.gov  American Heart Association: www.heart.org Summary  The DASH eating plan is a healthy eating plan that has been shown to reduce high blood pressure (hypertension). It may also reduce your risk for type 2 diabetes, heart disease, and stroke.  With the DASH eating plan, you should limit salt (sodium) intake to 2,300 mg a day. If you have hypertension, you may need to reduce your sodium intake to 1,500 mg a day.  When on the DASH eating plan, aim to eat more fresh fruits and vegetables, whole grains, lean proteins, low-fat dairy, and heart-healthy fats.  Work with your health care provider or diet and nutrition specialist (dietitian) to adjust your eating plan to your individual   calorie needs. This information is not intended to replace advice given to you by your health care provider. Make sure you discuss any questions you have with your health care provider. Document Released: 03/12/2011 Document Revised: 03/16/2016 Document Reviewed: 03/16/2016 Elsevier Interactive Patient Education  2018 Elsevier Inc.  

## 2017-11-26 NOTE — Assessment & Plan Note (Signed)
F/u pulmonary Antihistamines, PPI and albuterol did not help

## 2017-11-26 NOTE — Assessment & Plan Note (Signed)
Check labs No chest pain or sob

## 2017-11-26 NOTE — Assessment & Plan Note (Signed)
Check labs con't synthroid 

## 2017-11-26 NOTE — Progress Notes (Signed)
Patient ID: Alexandria [name], female   DOB: 06-25-1945, 72 y.o.   MRN: [id number]     Subjective:  I acted as a Education administrator for Dr. Carollee Herter.  Guerry Bruin, [place]   Patient ID: Alexandria [name], female    DOB: 09/06/1945, 72 y.o.   MRN: [id number]  Chief Complaint  Patient presents with  . Hypothyroidism    HPI  Patient is in today for follow up thyroid and blood work for cholesterol.  Complains of not sleeping lately -- her mind is just racing.  No caffeine , no tv/computer.  She has been under a lot of stress.  She also c/o cont cough---  PPI did nothing,  Albuterol did nothing as well.  She was going to pulm in [place] but would rather stay in [place]. Patient Care Team: Carollee Herter, Alferd Apa, DO as PCP - General (Family Medicine) Maisie Fus, MD as Consulting Physician (Obstetrics and Gynecology)   Past Medical History:  Diagnosis Date  . Arthritis   . Asthmatic bronchitis with acute exacerbation 02/06/2015  . Bronchial pneumonia   . Colon polyp   . Diverticulitis   . DVT (deep venous thrombosis) ([place])    lower extremity  . H/O cardiac catheterization 2004   Normal coronary arteries  . Heart murmur   . History of stress test 05/21/2011  . Hx of echocardiogram 01/27/2010   Normal Ef 55% the transmitral spectral doppler flow pattern is normal for age. the left ventricular wall motion is normal  . Migraines   . Pancreatitis   . Thyroid disease   . TIA (transient ischemic attack)    8 years ago    Past Surgical History:  Procedure Laterality Date  . BREAST BIOPSY     Isaiah Blakes  . CARDIAC CATHETERIZATION     10/2013  . LEFT HEART CATHETERIZATION WITH CORONARY ANGIOGRAM N/A 10/25/2013   Procedure: LEFT HEART CATHETERIZATION WITH CORONARY ANGIOGRAM;  Surgeon: Troy Sine, MD;  Location[facility] C[facility] LAB;  Service: Cardiovascular;  Laterality: N/A;  . NECK SURGERY     x's 2  . TOTAL KNEE ARTHROPLASTY     Bilateral x's 2  . VAGINAL HYSTERECTOMY  10/17/1998   Dory Horn    Family  History  Problem Relation Age of Onset  . Arthritis Unknown   . Colon cancer Father   . Prostate cancer Father   . HIV Brother        76  . Kidney cancer Brother   . Colon cancer Brother   . Lung cancer Brother   . Other Brother        Mouth Cancer  . Ovarian cancer Mother   . Uterine cancer Mother   . Heart disease Maternal Grandmother   . Stroke Maternal Grandmother   . Hypertension Maternal Grandmother   . Diabetes Maternal Grandmother     Social History   Socioeconomic History  . Marital status: Married    Spouse name: [name]  . Number of children: 3  . Years of education: [employer]  . Highest education level: Not on file  Occupational History  . Occupation: DISABLED    Employer: UNEMPLOYED  Social Needs  . Financial resource strain: Not on file  . Food insecurity:    Worry: Not on file    Inability: Not on file  . Transportation needs:    Medical: Not on file    Non-medical: Not on file  Tobacco Use  . Smoking status: Former Smoker  Years: 2.00    Last attempt to quit: 04/06/1969    Years since quitting: 48.6  . Smokeless tobacco: Never Used  . Tobacco comment: socially smoked x 2 years  Substance and Sexual Activity  . Alcohol use: No    Alcohol/week: 0.0 standard drinks  . Drug use: No  . Sexual activity: Not on file  Lifestyle  . Physical activity:    Days per week: Not on file    Minutes per session: Not on file  . Stress: Not on file  Relationships  . Social connections:    Talks on phone: Not on file    Gets together: Not on file    Attends religious service: Not on file    Active member of club or organization: Not on file    Attends meetings of clubs or organizations: Not on file    Relationship status: Not on file  . Intimate partner violence:    Fear of current or ex partner: Not on file    Emotionally abused: Not on file    Physically abused: Not on file    Forced sexual activity: Not on file  Other Topics Concern  . Not on file    Social History Narrative   Lives with husband [name]   Caffeine use: coffee (2 cups per day)   Mostly right-handed    Outpatient Medications Prior to Visit  Medication Sig Dispense Refill  . Ascorbic Acid (VITAMIN C PO) Vitamin C 500mg -Take 2-4 capsules by mouth daily.    . Biotin (RA BIOTIN) 1000 MCG tablet Take 1,000 mcg by mouth 2 (two) times daily.     Marland Kitchen BREWERS YEAST PO Take 2 tablets by mouth 2 (two) times daily.     . clopidogrel (PLAVIX) 75 MG tablet TAKE 1 TABLET BY MOUTH EVERY DAY 90 tablet 1  . dicyclomine (BENTYL) 20 MG tablet TAKE 1 TABLET BY MOUTH FOUR TIMES A DAY AS NEEDED 360 tablet 0  . FOLIC ACID PO Folic Acid 50mg -Take 1 tablet by mouth daily.    . furosemide (LASIX) 40 MG tablet TAKE 1 TABLET BY MOUTH TWICE A DAY 180 tablet 1  . KLOR-CON M20 20 MEQ tablet TAKE 1 TABLET BY MOUTH TWICE A DAY 180 tablet 2  . lactulose, encephalopathy, (CHRONULAC) 10 GM/15ML SOLN TAKE 30 MLS (20 G TOTAL) BY MOUTH EVERY 6 (SIX) HOURS AS NEEDED. 1892 mL 1  . Multiple Vitamin (MULTIVITAMIN) tablet Take 1 tablet by mouth daily.    Marland Kitchen OVER THE COUNTER MEDICATION Omega-3 Fatty acids-Vitamin E (fish oil) 1 capsule by mouth daily.    Marland Kitchen SYNTHROID 112 MCG tablet TAKE 1 TABLET BY MOUTH EVERY DAY BEFORE BREAKFAST 90 tablet 1  . vitamin D, CHOLECALCIFEROL, 400 UNITS tablet Take 400 Units by mouth 2 (two) times daily.     . busPIRone (BUSPAR) 15 MG tablet Take 1 tablet (15 mg total) by mouth 2 (two) times daily. 60 tablet 3  . ciprofloxacin (CIPRO) 500 MG tablet Take 1 tablet (500 mg total) by mouth 2 (two) times daily. 20 tablet 0  . doxycycline (VIBRA-TABS) 100 MG tablet Take 1 tablet (100 mg total) by mouth 2 (two) times daily. 20 tablet 0  . estradiol (ESTRACE) 2 MG tablet Take 2-3 mg by mouth daily.   11   No facility-administered medications prior to visit.     Allergies  Allergen Reactions  . Prednisone Other (See Comments)    Changed personality   . Atrovent Hfa [Ipratropium Bromide Hfa]  Other (See Comments)    Pt could not sleep  . Cheratussin Ac [Guaifenesin-Codeine]   . Hydrocodone Itching  . Influenza Vaccines Other (See Comments)    Pt reports heart attack after last flu shot  . Motrin [Ibuprofen] Other (See Comments)    "Gives false reading in blood"    Review of Systems  Constitutional: Negative for chills, fever and malaise/fatigue.  HENT: Negative for congestion and hearing loss.   Eyes: Negative for blurred vision and discharge.  Respiratory: Negative for cough, sputum production and shortness of breath.   Cardiovascular: Negative for chest pain, palpitations and leg swelling.  Gastrointestinal: Negative for abdominal pain, blood in stool, constipation, diarrhea, heartburn, nausea and vomiting.  Genitourinary: Negative for dysuria, frequency, hematuria and urgency.  Musculoskeletal: Negative for back pain, falls and myalgias.  Skin: Negative for rash.  Neurological: Negative for dizziness, sensory change, loss of consciousness, weakness and headaches.  Endo/Heme/Allergies: Negative for environmental allergies. Does not bruise/bleed easily.  Psychiatric/Behavioral: Negative for depression, memory loss and suicidal ideas. The patient has insomnia. The patient is not nervous/anxious.        Objective:    Physical Exam  Constitutional: She is oriented to person, place, and time. She appears well-developed and well-nourished.  HENT:  Head: Normocephalic and atraumatic.  Eyes: Conjunctivae and EOM are normal.  Neck: Normal range of motion. Neck supple. No JVD present. Carotid bruit is not present. No thyromegaly present.  Cardiovascular: Normal rate, regular rhythm and normal heart sounds.  No murmur heard. Pulmonary/Chest: Effort normal and breath sounds normal. No respiratory distress. She has no wheezes. She has no rales. She exhibits no tenderness.  Musculoskeletal: She exhibits no edema.  Neurological: She is alert and oriented to person, place, and  time.  Psychiatric: She has a normal mood and affect. Her behavior is normal. Judgment and thought content normal.  Nursing note and vitals reviewed.   BP (!) 146/58   Pulse (!) 57   Temp 98.1 F (36.7 C) (Oral)   Resp 16   Ht 5\' 4"  (1.626 m)   Wt 138 lb 12.8 oz (63 kg)   SpO2 97%   BMI 23.82 kg/m  Wt Readings from Last 3 Encounters:  11/26/17 138 lb 12.8 oz (63 kg)  08/24/17 141 lb 12.8 oz (64.3 kg)  06/18/17 141 lb 6.4 oz (64.1 kg)   BP Readings from Last 3 Encounters:  11/26/17 (!) 146/58  08/24/17 110/86  06/18/17 124/78     Immunization History  Administered Date(s) Administered  . Td 04/06/2013  . Tdap 04/06/1998    Health Maintenance  Topic Date Due  . DEXA SCAN  12/15/2009  . PNA vac Low Risk Adult (1 of 2 - PCV13) 12/15/2009  . MAMMOGRAM  09/11/2017  . COLONOSCOPY  05/26/2022  . TETANUS/TDAP  04/07/2023  . Hepatitis C Screening  Completed    Lab Results  Component Value Date   WBC 4.7 08/24/2017   HGB 13.6 08/24/2017   HCT 39.2 08/24/2017   PLT 255.0 08/24/2017   GLUCOSE 94 08/24/2017   CHOL 190 05/03/2017   TRIG 58.0 05/03/2017   HDL 75.50 05/03/2017   LDLCALC 102 (H) 05/03/2017   ALT 15 08/24/2017   AST 15 08/24/2017   NA 139 08/24/2017   K 3.5 08/24/2017   CL 99 08/24/2017   CREATININE 0.69 08/24/2017   BUN 7 08/24/2017   CO2 32 08/24/2017   TSH 1.33 04/08/2017   INR 1.1 (H) 05/07/2015    Lab  Results  Component Value Date   TSH 1.33 04/08/2017   Lab Results  Component Value Date   WBC 4.7 08/24/2017   HGB 13.6 08/24/2017   HCT 39.2 08/24/2017   MCV 95.1 08/24/2017   PLT 255.0 08/24/2017   Lab Results  Component Value Date   NA 139 08/24/2017   K 3.5 08/24/2017   CO2 32 08/24/2017   GLUCOSE 94 08/24/2017   BUN 7 08/24/2017   CREATININE 0.69 08/24/2017   BILITOT 0.5 08/24/2017   ALKPHOS 70 08/24/2017   AST 15 08/24/2017   ALT 15 08/24/2017   PROT 6.9 08/24/2017   ALBUMIN 3.7 08/24/2017   CALCIUM 9.0 08/24/2017    ANIONGAP 14 04/09/2016   GFR 88.72 08/24/2017   Lab Results  Component Value Date   CHOL 190 05/03/2017   Lab Results  Component Value Date   HDL 75.50 05/03/2017   Lab Results  Component Value Date   LDLCALC 102 (H) 05/03/2017   Lab Results  Component Value Date   TRIG 58.0 05/03/2017   Lab Results  Component Value Date   CHOLHDL 3 05/03/2017   No results found for: HGBA1C       Assessment & Plan:   Problem List Items Addressed This Visit      Unprioritized   Chronic cough    F/u pulmonary Antihistamines, PPI and albuterol did not help      Relevant Orders   Ambulatory referral to Pulmonology   Hx of myocardial infarction    Check labs No chest pain or sob      Relevant Orders   Comprehensive metabolic panel   Lipid panel   TSH   Hypothyroidism - Primary    Check labs con't synthroid      Relevant Orders   Comprehensive metabolic panel   Lipid panel   TSH   Insomnia   Relevant Medications   ALPRAZolam (XANAX) 0.25 MG tablet      I have discontinued Claris Gower. [place]'s estradiol, doxycycline, busPIRone, and ciprofloxacin. I am also having her start on ALPRAZolam. Additionally, I am having her maintain her vitamin D (CHOLECALCIFEROL), BREWERS YEAST PO, multivitamin, Biotin, Ascorbic Acid (VITAMIN C PO), FOLIC ACID PO, OVER THE COUNTER MEDICATION, lactulose (encephalopathy), KLOR-CON M20, SYNTHROID, furosemide, clopidogrel, and dicyclomine.  Meds ordered this encounter  Medications  . ALPRAZolam (XANAX) 0.25 MG tablet    Sig: Take 1 tablet (0.25 mg total) by mouth at bedtime.    Dispense:  30 tablet    Refill:  0    CMA served as scribe during this visit. History, Physical and Plan performed by medical provider. Documentation and orders reviewed and attested to.  [name], DO

## 2017-12-01 DIAGNOSIS — K2970 Gastritis, unspecified, without bleeding: Secondary | ICD-10-CM | POA: Diagnosis not present

## 2017-12-01 DIAGNOSIS — K2950 Unspecified chronic gastritis without bleeding: Secondary | ICD-10-CM | POA: Diagnosis not present

## 2017-12-01 DIAGNOSIS — K2930 Chronic superficial gastritis without bleeding: Secondary | ICD-10-CM | POA: Diagnosis not present

## 2017-12-07 ENCOUNTER — Telehealth: Payer: Self-pay

## 2017-12-07 NOTE — Telephone Encounter (Signed)
Copied from [place] [id number]. Topic: Quick Communication - Lab Results >> Dec 03, 2017  8:19 AM Lennox Solders wrote: Pt is calling and would like blood work results >> Dec 07, 2017 10:06 AM Gardiner Ramus wrote: Pt calling back again for lab results  >> Dec 07, 2017  4:53 PM Bunnie Domino, LPN wrote: Returned patients call regarding labs, no answer.

## 2017-12-07 NOTE — Telephone Encounter (Signed)
Called patient to review lab results. Left message to return call.

## 2017-12-07 NOTE — Telephone Encounter (Signed)
Patient returning a call for lab results. Please advise.

## 2017-12-08 ENCOUNTER — Telehealth: Payer: Self-pay

## 2017-12-08 NOTE — Telephone Encounter (Signed)
Patient returned call to review lab results.

## 2017-12-08 NOTE — Telephone Encounter (Signed)
Spoke with patient about her lab results per her request.

## 2017-12-09 ENCOUNTER — Other Ambulatory Visit: Payer: Self-pay

## 2017-12-09 DIAGNOSIS — E876 Hypokalemia: Secondary | ICD-10-CM

## 2017-12-09 NOTE — Progress Notes (Signed)
co

## 2017-12-13 ENCOUNTER — Ambulatory Visit: Payer: Self-pay

## 2017-12-13 NOTE — Telephone Encounter (Signed)
Pt. Reports she has a history of low potassium and she has increased dizziness which occurs with the low K+. Is having dizziness mainly with changing positions. States she "cut back on my Lasix - I'm only taking it once a day." Wants to know if Dr.Lowne-Chase wants to see her or just increase her Potassium. She is currently taking 3 a day. Please advise pt.  Answer Assessment - Initial Assessment Questions 1. DESCRIPTION: "Describe your dizziness."     Dixxy 2. LIGHTHEADED: "Do you feel lightheaded?" (e.g., somewhat faint, woozy, weak upon standing)     Yes 3. VERTIGO: "Do you feel like either you or the room is spinning or tilting?" (i.e. vertigo)     No 4. SEVERITY: "How bad is it?"  "Do you feel like you are going to faint?" "Can you stand and walk?"   - MILD - walking normally   - MODERATE - interferes with normal activities (e.g., work, school)    - SEVERE - unable to stand, requires support to walk, feels like passing out now.      Mild 5. ONSET:  "When did the dizziness begin?"     Started  A long time ago - getting worse 6. AGGRAVATING FACTORS: "Does anything make it worse?" (e.g., standing, change in head position)     No 7. HEART RATE: "Can you tell me your heart rate?" "How many beats in 15 seconds?"  (Note: not all patients can do this)       Has palpitations 8. CAUSE: "What do you think is causing the dizziness?"     My potassium 9. RECURRENT SYMPTOM: "Have you had dizziness before?" If so, ask: "When was the last time?" "What happened that time?"     Yes 10. OTHER SYMPTOMS: "Do you have any other symptoms?" (e.g., fever, chest pain, vomiting, diarrhea, bleeding)       Diarrhea 11. PREGNANCY: "Is there any chance you are pregnant?" "When was your last menstrual period?"       n/a  Protocols used: DIZZINESS [facility]

## 2017-12-13 NOTE — Telephone Encounter (Signed)
Pt. Called back to report she does have nausea and feels "like she is in a mental fog sometimes."

## 2017-12-13 NOTE — Telephone Encounter (Signed)
We need to see her to recheck

## 2017-12-14 ENCOUNTER — Encounter: Payer: Self-pay | Admitting: Family Medicine

## 2017-12-14 ENCOUNTER — Ambulatory Visit (INDEPENDENT_AMBULATORY_CARE_PROVIDER_SITE_OTHER): Payer: Medicare Other | Admitting: Family Medicine

## 2017-12-14 VITALS — BP 136/68 | HR 71 | Temp 98.8°F | Resp 16 | Ht 64.0 in | Wt 136.0 lb

## 2017-12-14 DIAGNOSIS — R002 Palpitations: Secondary | ICD-10-CM

## 2017-12-14 DIAGNOSIS — E876 Hypokalemia: Secondary | ICD-10-CM | POA: Diagnosis not present

## 2017-12-14 NOTE — Patient Instructions (Signed)

## 2017-12-14 NOTE — Telephone Encounter (Signed)
Patient and is on schedule for today

## 2017-12-14 NOTE — Progress Notes (Signed)
Patient ID: Alexandria [name], female    DOB: Oct 14, 1945  Age: 72 y.o. MRN: [id number]    Subjective:  Subjective  HPI Alexandria [name] presents to review lab results   No new complaints--- palpitations con't -- no worse  No chest pain or sob.  She would like to see cardiology  Review of Systems  Constitutional: Negative for fever.  HENT: Negative for congestion.   Respiratory: Negative for shortness of breath.   Cardiovascular: Negative for chest pain, palpitations and leg swelling.  Gastrointestinal: Negative for abdominal pain, blood in stool and nausea.  Genitourinary: Negative for dysuria and frequency.  Skin: Negative for rash.  Allergic/Immunologic: Negative for environmental allergies.  Neurological: Negative for dizziness and headaches.  Psychiatric/Behavioral: The patient is not nervous/anxious.     History Past Medical History:  Diagnosis Date  . Arthritis   . Asthmatic bronchitis with acute exacerbation 02/06/2015  . Bronchial pneumonia   . Colon polyp   . Diverticulitis   . DVT (deep venous thrombosis) ([place])    lower extremity  . H/O cardiac catheterization 2004   Normal coronary arteries  . Heart murmur   . History of stress test 05/21/2011  . Hx of echocardiogram 01/27/2010   Normal Ef 55% the transmitral spectral doppler flow pattern is normal for age. the left ventricular wall motion is normal  . Migraines   . Pancreatitis   . Thyroid disease   . TIA (transient ischemic attack)    8 years ago    She has a past surgical history that includes Vaginal hysterectomy (10/17/1998); Breast biopsy; Total knee arthroplasty; Neck surgery; Cardiac catheterization; and left heart catheterization with coronary angiogram (N/A, 10/25/2013).   Her family history includes Arthritis in her unknown relative; Colon cancer in her brother and father; Diabetes in her maternal grandmother; HIV in her brother; Heart disease in her maternal grandmother; Hypertension in her  maternal grandmother; Kidney cancer in her brother; Lung cancer in her brother; Other in her brother; Ovarian cancer in her mother; Prostate cancer in her father; Stroke in her maternal grandmother; Uterine cancer in her mother.She reports that she quit smoking about 48 years ago. She quit after 2.00 years of use. She has never used smokeless tobacco. She reports that she does not drink alcohol or use drugs.  Current Outpatient Medications on File Prior to Visit  Medication Sig Dispense Refill  . ALPRAZolam (XANAX) 0.25 MG tablet Take 1 tablet (0.25 mg total) by mouth at bedtime. 30 tablet 0  . Ascorbic Acid (VITAMIN C PO) Vitamin C 500mg -Take 2-4 capsules by mouth daily.    . Biotin (RA BIOTIN) 1000 MCG tablet Take 1,000 mcg by mouth 2 (two) times daily.     Marland Kitchen BREWERS YEAST PO Take 2 tablets by mouth 2 (two) times daily.     . clopidogrel (PLAVIX) 75 MG tablet TAKE 1 TABLET BY MOUTH EVERY DAY 90 tablet 1  . dicyclomine (BENTYL) 20 MG tablet TAKE 1 TABLET BY MOUTH FOUR TIMES A DAY AS NEEDED 360 tablet 0  . FOLIC ACID PO Folic Acid 50mg -Take 1 tablet by mouth daily.    . furosemide (LASIX) 40 MG tablet TAKE 1 TABLET BY MOUTH TWICE A DAY 180 tablet 1  . KLOR-CON M20 20 MEQ tablet TAKE 1 TABLET BY MOUTH TWICE A DAY 180 tablet 2  . lactulose, encephalopathy, (CHRONULAC) 10 GM/15ML SOLN TAKE 30 MLS (20 G TOTAL) BY MOUTH EVERY 6 (SIX) HOURS AS NEEDED. 1892 mL 1  . Multiple  Vitamin (MULTIVITAMIN) tablet Take 1 tablet by mouth daily.    Marland Kitchen OVER THE COUNTER MEDICATION Omega-3 Fatty acids-Vitamin E (fish oil) 1 capsule by mouth daily.    Marland Kitchen SYNTHROID 112 MCG tablet TAKE 1 TABLET BY MOUTH EVERY DAY BEFORE BREAKFAST 90 tablet 1  . vitamin D, CHOLECALCIFEROL, 400 UNITS tablet Take 400 Units by mouth 2 (two) times daily.      No current facility-administered medications on file prior to visit.      Objective:  Objective  Physical Exam  Constitutional: She is oriented to person, place, and time. She appears  well-developed and well-nourished.  HENT:  Head: Normocephalic and atraumatic.  Eyes: Conjunctivae and EOM are normal.  Neck: Normal range of motion. Neck supple. No JVD present. Carotid bruit is not present. No thyromegaly present.  Cardiovascular: Normal rate, regular rhythm and normal heart sounds.  No murmur heard. Pulmonary/Chest: Effort normal and breath sounds normal. No respiratory distress. She has no wheezes. She has no rales. She exhibits no tenderness.  Musculoskeletal: She exhibits edema.  Neurological: She is alert and oriented to person, place, and time.  Psychiatric: She has a normal mood and affect.  Nursing note and vitals reviewed.  BP 136/68 (BP Location: Left Arm, Cuff Size: Normal)   Pulse 71   Temp 98.8 F (37.1 C) (Oral)   Resp 16   Ht 5\' 4"  (1.626 m)   Wt 136 lb (61.7 kg)   SpO2 96%   BMI 23.34 kg/m  Wt Readings from Last 3 Encounters:  12/14/17 136 lb (61.7 kg)  11/26/17 138 lb 12.8 oz (63 kg)  08/24/17 141 lb 12.8 oz (64.3 kg)     Lab Results  Component Value Date   WBC 4.7 08/24/2017   HGB 13.6 08/24/2017   HCT 39.2 08/24/2017   PLT 255.0 08/24/2017   GLUCOSE 92 11/26/2017   CHOL 189 11/26/2017   TRIG 51.0 11/26/2017   HDL 61.60 11/26/2017   LDLCALC 117 (H) 11/26/2017   ALT 20 11/26/2017   AST 21 11/26/2017   NA 139 11/26/2017   K 3.2 (L) 11/26/2017   CL 100 11/26/2017   CREATININE 0.79 11/26/2017   BUN 12 11/26/2017   CO2 34 (H) 11/26/2017   TSH 0.62 11/26/2017   INR 1.1 (H) 05/07/2015    Ct Abdomen Pelvis W Contrast  Result Date: 08/25/2017 CLINICAL DATA:  Lower abdominal pain for 6 days. Nausea and loss of appetite. EXAM: CT ABDOMEN AND PELVIS WITH CONTRAST TECHNIQUE: Multidetector CT imaging of the abdomen and pelvis was performed using the standard protocol following bolus administration of intravenous contrast. CONTRAST:  11mL ISOVUE-300 IOPAMIDOL (ISOVUE-300) INJECTION 61% COMPARISON:  CT scan 05/08/2016 FINDINGS: Lower chest:  Chronic peripheral interstitial disease. No infiltrates or effusions. The heart is borderline enlarged but stable. No pericardial effusion. Stable moderate atherosclerotic calcifications involving the distal thoracic aorta. The distal esophagus is grossly normal. Hepatobiliary: No focal hepatic lesions or intrahepatic biliary dilatation. The gallbladder is normal. No common bile duct dilatation. Pancreas: Moderate pancreatic atrophy but no mass or inflammation. Spleen: Normal size.  No focal lesions. Adrenals/Urinary Tract: The adrenal glands and kidneys are unremarkable. The bladder appears normal. Stomach/Bowel: The stomach, duodenum and small bowel are unremarkable. No acute inflammatory changes, mass lesions or obstructive findings. The terminal ileum is normal. The appendix is normal. Advanced sigmoid colon diverticulosis. There is slight colonic wall thickening and surrounding inflammation at the lower sigmoid colon area near its junction with the rectum. This could be  an area of uncomplicated diverticulitis. Vascular/Lymphatic: Advanced atherosclerotic calcifications involving the aorta and iliac arteries but no aneurysm or dissection. The branch vessels are patent. Moderate proximal right renal artery calcifications. The major venous structures are patent. Small scattered mesenteric and retroperitoneal lymph nodes. Reproductive: Surgically absent. Other: No pelvic mass or adenopathy. No free pelvic fluid collections. No inguinal mass or adenopathy. No abdominal wall hernia or subcutaneous lesions. Musculoskeletal: No significant findings. IMPRESSION: 1. Suspect lower sigmoid colon uncomplicated diverticulitis. 2. No other significant or acute abdominal/pelvic findings and no mass lesions or adenopathy. Electronically Signed   By: Marijo Sanes M.D.   On: 08/25/2017 16:45     Assessment & Plan:  Plan  I am having [name] maintain her vitamin D (CHOLECALCIFEROL), BREWERS YEAST PO,  multivitamin, Biotin, Ascorbic Acid (VITAMIN C PO), FOLIC ACID PO, OVER THE COUNTER MEDICATION, lactulose (encephalopathy), KLOR-CON M20, SYNTHROID, furosemide, clopidogrel, dicyclomine, and ALPRAZolam.  No orders of the defined types were placed in this encounter.   Problem List Items Addressed This Visit      Unprioritized   Hypokalemia - Primary    con't k supplement Recheck labs      Relevant Orders   Comprehensive metabolic panel   Palpitations    No worse  Pt would like to see cardiology due to con't symptoms      Relevant Orders   Ambulatory referral to Cardiology   Cardiac event monitor      Follow-up: Return in about 6 months (around 06/14/2018), or if symptoms worsen or fail to improve.  [name], DO

## 2017-12-15 DIAGNOSIS — E876 Hypokalemia: Secondary | ICD-10-CM | POA: Insufficient documentation

## 2017-12-15 LAB — COMPREHENSIVE METABOLIC PANEL
ALT: 17 U/L (ref 0–35)
AST: 19 U/L (ref 0–37)
Albumin: 3.9 g/dL (ref 3.5–5.2)
Alkaline Phosphatase: 81 U/L (ref 39–117)
BUN: 13 mg/dL (ref 6–23)
CALCIUM: 9.3 mg/dL (ref 8.4–10.5)
CHLORIDE: 100 meq/L (ref 96–112)
CO2: 32 meq/L (ref 19–32)
Creatinine, Ser: 0.86 mg/dL (ref 0.40–1.20)
GFR: 68.75 mL/min (ref >60.00)
Glucose, Bld: 137 mg/dL — ABNORMAL HIGH (ref 70–99)
Potassium: 3.4 mEq/L — ABNORMAL LOW (ref 3.5–5.1)
Sodium: 139 mEq/L (ref 135–145)
TOTAL PROTEIN: 6.7 g/dL (ref 6.0–8.3)
Total Bilirubin: 0.5 mg/dL (ref 0.2–1.2)

## 2017-12-15 NOTE — Assessment & Plan Note (Signed)
con't k supplement Recheck labs

## 2017-12-15 NOTE — Assessment & Plan Note (Signed)
No worse  Pt would like to see cardiology due to con't symptoms

## 2017-12-17 ENCOUNTER — Other Ambulatory Visit: Payer: Self-pay | Admitting: Family Medicine

## 2017-12-17 NOTE — Telephone Encounter (Signed)
Please advise 

## 2017-12-17 NOTE — Telephone Encounter (Signed)
Pt states she is taking 5 tabs a day, 3 in the am and 2 in the pm and that seems to work.  Old Rx was for 4 x a day.  Pt states Dr Etter Sjogren upped the rx. Now she is out. Will need new Rx with new amount at 5 a day. dicyclomine (BENTYL) 20 MG tablet  [id number] - [place], [place] - [address] AT [address] [phone] (Phone) [phone] (Fax

## 2017-12-27 ENCOUNTER — Ambulatory Visit (INDEPENDENT_AMBULATORY_CARE_PROVIDER_SITE_OTHER): Payer: Medicare Other | Admitting: Pulmonary Disease

## 2017-12-27 ENCOUNTER — Encounter: Payer: Self-pay | Admitting: Pulmonary Disease

## 2017-12-27 DIAGNOSIS — R05 Cough: Secondary | ICD-10-CM | POA: Diagnosis not present

## 2017-12-27 DIAGNOSIS — R059 Cough, unspecified: Secondary | ICD-10-CM

## 2017-12-27 NOTE — Patient Instructions (Signed)
Chronic cough: For now continue using Symbicort 2 puffs twice a day I am concerned about a condition called bronchiectasis I am going to check a lung function test and a high-resolution CT scan of your chest  Gastroesophageal reflux disease: Because you feel that papaya is quite helpful for this I would asked that she take it every day for a month to see if this helps with the severity of the cough.  We will see you back in about 3 weeks to go over the results of the CT of the lung function test

## 2017-12-27 NOTE — Progress Notes (Signed)
Synopsis: Former patient of Dr. Melvyn Novas with UACS and asthma. She has a history of acid reflux and intolerance to antacids.  She treats her reflux with papaya. She says that she has had recurrent episodes of bronchitis on an annual basis for many years.  Subjective:   PATIENT ID: Alexandria [name] GENDER: female DOB: Jan 16, 1946, MRN: [id number]   HPI  Chief Complaint  Patient presents with  . Follow-up    Pt would like to be established here at [facility] location. Pt has dry cough, can not get anything up. Pt can walk flat surface no problem, up flight of stairs she becomes SOB with exertion.   Alexandria [name] returns to my clinic today to establish care with me here in [facility] because is much closer to her home.  She has previously followed with my partner Dr. Melvyn Novas for an upper airway cough syndrome.  She tells me that she has had a long history of gastroesophageal reflux disease which she has treated with papaya over the years.  However despite this she says that recently she has not had any problems with the disease and that she denies any recent heartburn or indigestion.  However, she says that she coughs nearly every day and the episodes are quite severe.  She says that she will have problems with cough which leads to shortness of breath about 1 time per day.  She says that she is never really been able to sort out what the triggers are.  She rarely produces mucus in fact most the time it is a dry, hacking cough.  She says that climbing a flight of stairs typically will make her cough.  Yesterday she was driving a car and she coughed so hard that she became very short of breath and she was worried that she drive off the road.  She tells me for that for the last several years she has had bronchitis on an annual basis.  She says that she ends up coughing up mucus and feels sick with "bronchial pneumonia" and has to go to the doctor and be treated with antibiotics.  She smoked socially but quit 48  years ago.  She will occasionally have some sinus symptoms but not that often.  Past Medical History:  Diagnosis Date  . Arthritis   . Asthmatic bronchitis with acute exacerbation 02/06/2015  . Bronchial pneumonia   . Colon polyp   . Diverticulitis   . DVT (deep venous thrombosis) ([place])    lower extremity  . H/O cardiac catheterization 2004   Normal coronary arteries  . Heart murmur   . History of stress test 05/21/2011  . Hx of echocardiogram 01/27/2010   Normal Ef 55% the transmitral spectral doppler flow pattern is normal for age. the left ventricular wall motion is normal  . Migraines   . Pancreatitis   . Thyroid disease   . TIA (transient ischemic attack)    8 years ago     Family History  Problem Relation Age of Onset  . Arthritis Unknown   . Colon cancer Father   . Prostate cancer Father   . HIV Brother        19  . Kidney cancer Brother   . Colon cancer Brother   . Lung cancer Brother   . Other Brother        Mouth Cancer  . Ovarian cancer Mother   . Uterine cancer Mother   . Heart disease Maternal Grandmother   . Stroke Maternal  Grandmother   . Hypertension Maternal Grandmother   . Diabetes Maternal Grandmother      Social History   Socioeconomic History  . Marital status: Married    Spouse name: [name]  . Number of children: 3  . Years of education: [employer]  . Highest education level: Not on file  Occupational History  . Occupation: DISABLED    Employer: UNEMPLOYED  Social Needs  . Financial resource strain: Not on file  . Food insecurity:    Worry: Not on file    Inability: Not on file  . Transportation needs:    Medical: Not on file    Non-medical: Not on file  Tobacco Use  . Smoking status: Former Smoker    Years: 2.00    Last attempt to quit: 04/06/1969    Years since quitting: 48.7  . Smokeless tobacco: Never Used  . Tobacco comment: socially smoked x 2 years  Substance and Sexual Activity  . Alcohol use: No    Alcohol/week:  0.0 standard drinks  . Drug use: No  . Sexual activity: Not on file  Lifestyle  . Physical activity:    Days per week: Not on file    Minutes per session: Not on file  . Stress: Not on file  Relationships  . Social connections:    Talks on phone: Not on file    Gets together: Not on file    Attends religious service: Not on file    Active member of club or organization: Not on file    Attends meetings of clubs or organizations: Not on file    Relationship status: Not on file  . Intimate partner violence:    Fear of current or ex partner: Not on file    Emotionally abused: Not on file    Physically abused: Not on file    Forced sexual activity: Not on file  Other Topics Concern  . Not on file  Social History Narrative   Lives with husband [name]   Caffeine use: coffee (2 cups per day)   Mostly right-handed     Allergies  Allergen Reactions  . Prednisone Other (See Comments)    Changed personality   . Atrovent Hfa [Ipratropium Bromide Hfa] Other (See Comments)    Pt could not sleep  . Cheratussin Ac [Guaifenesin-Codeine]   . Hydrocodone Itching  . Influenza Vaccines Other (See Comments)    Pt reports heart attack after last flu shot  . Motrin [Ibuprofen] Other (See Comments)    "Gives false reading in blood"     Outpatient Medications Prior to Visit  Medication Sig Dispense Refill  . Ascorbic Acid (VITAMIN C PO) Vitamin C 500mg -Take 2-4 capsules by mouth daily.    . Biotin (RA BIOTIN) 1000 MCG tablet Take 1,000 mcg by mouth 2 (two) times daily.     Marland Kitchen BREWERS YEAST PO Take 2 tablets by mouth 2 (two) times daily.     . clopidogrel (PLAVIX) 75 MG tablet TAKE 1 TABLET BY MOUTH EVERY DAY 90 tablet 1  . dicyclomine (BENTYL) 20 MG tablet TAKE 1 TABLET BY MOUTH FOUR TIMES A DAY AS NEEDED 360 tablet 0  . FOLIC ACID PO Folic Acid 50mg -Take 1 tablet by mouth daily.    . furosemide (LASIX) 40 MG tablet TAKE 1 TABLET BY MOUTH TWICE A DAY 180 tablet 1  . KLOR-CON M20 20 MEQ tablet  TAKE 1 TABLET BY MOUTH TWICE A DAY 180 tablet 2  . lactulose, encephalopathy, (CHRONULAC)  10 GM/15ML SOLN TAKE 30 MLS (20 G TOTAL) BY MOUTH EVERY 6 (SIX) HOURS AS NEEDED. 1892 mL 1  . Multiple Vitamin (MULTIVITAMIN) tablet Take 1 tablet by mouth daily.    Marland Kitchen OVER THE COUNTER MEDICATION Omega-3 Fatty acids-Vitamin E (fish oil) 1 capsule by mouth daily.    Marland Kitchen SYNTHROID 112 MCG tablet TAKE 1 TABLET BY MOUTH EVERY DAY BEFORE BREAKFAST 90 tablet 1  . vitamin D, CHOLECALCIFEROL, 400 UNITS tablet Take 400 Units by mouth 2 (two) times daily.     Marland Kitchen ALPRAZolam (XANAX) 0.25 MG tablet Take 1 tablet (0.25 mg total) by mouth at bedtime. (Patient not taking: Reported on 12/27/2017) 30 tablet 0   No facility-administered medications prior to visit.     Review of Systems  Constitutional: Negative for diaphoresis, fever and weight loss.  HENT: Negative for congestion, ear discharge and sinus pain.   Respiratory: Positive for cough. Negative for sputum production, wheezing and stridor.   Cardiovascular: Negative for chest pain, orthopnea and leg swelling.      Objective:  Physical Exam   Vitals:   12/27/17 1453  BP: 114/74  Pulse: 70  SpO2: 97%  Weight: 136 lb (61.7 kg)  Height: 5\' 4"  (1.626 m)    Gen: well appearing, no acute distress HENT: NCAT, OP clear, neck supple without masses Eyes: PERRL, EOMi Lymph: no cervical lymphadenopathy PULM: Coarse crackles Right lower lobe B, clear elsewhere CV: RRR, no mgr, no JVD GI: BS+, soft, nontender, no hsm Derm: no rash or skin breakdown MSK: normal bulk and tone Neuro: A&Ox4, CN II-XII intact, strength 5/5 in all 4 extremities Psyche: normal mood and affect   CBC    Component Value Date/Time   WBC 4.7 08/24/2017 1127   RBC 4.12 08/24/2017 1127   HGB 13.6 08/24/2017 1127   HCT 39.2 08/24/2017 1127   PLT 255.0 08/24/2017 1127   MCV 95.1 08/24/2017 1127   MCH 33.0 04/09/2016 1304   MCHC 34.8 08/24/2017 1127   RDW 13.0 08/24/2017 1127    LYMPHSABS 1.3 08/24/2017 1127   MONOABS 0.7 08/24/2017 1127   EOSABS 0.2 08/24/2017 1127   BASOSABS 0.1 08/24/2017 1127     Chest imaging:  PFT:  Labs:  Path:  Echo:  Heart Catheterization:  rx max gerd 02/24/15 and d/c qvar > 85% improved, still some pm coughing > rec add 1st gen H1 at hs > resolved, try off all rx 04/16/2015  - flared mid Feb 2017  and did not resume rx as rec> 06/11/2015 suggested she resume gerd/h1 and f/u prn  - UGI 09/22/16 Pos mild/mod GERD  - FENO 11/30/2016  =   40 during flare off all rx - Allergy profile 11/30/2016 >  Eos 0.3 /  IgE  Pending     Assessment & Plan:   No diagnosis found.  Discussion: Maribel returns to my clinic today for evaluation of a dry hacking cough associated with shortness of breath.  This is previously been diagnosed as asthmatic bronchitis which may in fact be the case considering her increased exhaled nitric oxide test.  That being said, I am concerned about the abnormality on physical exam and that she has coarse crackles in the right base of her lungs and had a chest x-ray which showed possible chronic bronchitis changes there.  This raises concern for bronchiectasis.  If this is the case then it is unlikely we will ever be able to stop her cough altogether.  Plan: Chronic cough: For now  continue using Symbicort 2 puffs twice a day I am concerned about a condition called bronchiectasis I am going to check a lung function test and a high-resolution CT scan of your chest  Gastroesophageal reflux disease: Because you feel that papaya is quite helpful for this I would asked that she take it every day for a month to see if this helps with the severity of the cough.  We will see you back in about 3 weeks to go over the results of the CT of the lung function test  > 50% of this 30 minute visit spent face to face    Current Outpatient Medications:  .  Ascorbic Acid (VITAMIN C PO), Vitamin C 500mg -Take 2-4 capsules by mouth  daily., Disp: , Rfl:  .  Biotin (RA BIOTIN) 1000 MCG tablet, Take 1,000 mcg by mouth 2 (two) times daily. , Disp: , Rfl:  .  BREWERS YEAST PO, Take 2 tablets by mouth 2 (two) times daily. , Disp: , Rfl:  .  clopidogrel (PLAVIX) 75 MG tablet, TAKE 1 TABLET BY MOUTH EVERY DAY, Disp: 90 tablet, Rfl: 1 .  dicyclomine (BENTYL) 20 MG tablet, TAKE 1 TABLET BY MOUTH FOUR TIMES A DAY AS NEEDED, Disp: 360 tablet, Rfl: 0 .  FOLIC ACID PO, Folic Acid 50mg -Take 1 tablet by mouth daily., Disp: , Rfl:  .  furosemide (LASIX) 40 MG tablet, TAKE 1 TABLET BY MOUTH TWICE A DAY, Disp: 180 tablet, Rfl: 1 .  KLOR-CON M20 20 MEQ tablet, TAKE 1 TABLET BY MOUTH TWICE A DAY, Disp: 180 tablet, Rfl: 2 .  lactulose, encephalopathy, (CHRONULAC) 10 GM/15ML SOLN, TAKE 30 MLS (20 G TOTAL) BY MOUTH EVERY 6 (SIX) HOURS AS NEEDED., Disp: 1892 mL, Rfl: 1 .  Multiple Vitamin (MULTIVITAMIN) tablet, Take 1 tablet by mouth daily., Disp: , Rfl:  .  OVER THE COUNTER MEDICATION, Omega-3 Fatty acids-Vitamin E (fish oil) 1 capsule by mouth daily., Disp: , Rfl:  .  SYNTHROID 112 MCG tablet, TAKE 1 TABLET BY MOUTH EVERY DAY BEFORE BREAKFAST, Disp: 90 tablet, Rfl: 1 .  vitamin D, CHOLECALCIFEROL, 400 UNITS tablet, Take 400 Units by mouth 2 (two) times daily. , Disp: , Rfl:

## 2017-12-28 ENCOUNTER — Telehealth: Payer: Self-pay | Admitting: Pulmonary Disease

## 2017-12-28 ENCOUNTER — Ambulatory Visit: Payer: Medicare Other

## 2017-12-28 DIAGNOSIS — R002 Palpitations: Secondary | ICD-10-CM | POA: Diagnosis not present

## 2017-12-28 NOTE — Telephone Encounter (Signed)
Pt is calling back [phone]

## 2017-12-28 NOTE — Telephone Encounter (Signed)
Patient wants Suanne Marker to know wed 9/25 is ok for a ct in [place]

## 2017-12-28 NOTE — Telephone Encounter (Signed)
Pt is scheduled for CT on 12/29/17 Per Suanne Marker who usually schedules for  [place] pt is aware of appt

## 2017-12-28 NOTE — Telephone Encounter (Signed)
Called and spoke to patient. Dr. Lake Bells would like PFT and high resolution CT completed before next follow up appointment. Patient is scheduled for PFT on 12/30/17 at 11am and ROV is scheduled for [facility] on 01/18/18 at 10am.   [facility] please help to get high res CT done before 10/15 and let us know if that's not possible and we need to reschedule the OV. Thanks!

## 2017-12-28 NOTE — Telephone Encounter (Signed)
Attempted to call patient today to schedule full PFT, 3 week f/u in [place] with BQ or NP. Also need to check to see when HRCT scan will be scheduled. Pt needs to have the PFT and HRCT prior to the 3 week f/u in [place] per BQ. I did not receive an answer at time of call. I have left a voicemail message for pt to return call. X1

## 2017-12-29 ENCOUNTER — Ambulatory Visit (HOSPITAL_BASED_OUTPATIENT_CLINIC_OR_DEPARTMENT_OTHER): Payer: Medicare Other

## 2017-12-29 ENCOUNTER — Telehealth: Payer: Self-pay | Admitting: Pulmonary Disease

## 2017-12-29 NOTE — Telephone Encounter (Signed)
Patient would like to if she can take of her heart monitor for her up coming CT scan, and her PFT. Or would she need to reschedule till after 01/24/18 when she get this taken off. Also she was told to continue the Symbicort but no inhaler but no inhaler was called in and we need to verify the strength on the Symbicort.   Dr. Lake Bells please advise

## 2017-12-29 NOTE — Telephone Encounter (Signed)
Attempted to call pt. I did not receive an answer. I have left a message for pt to return our call.  

## 2017-12-29 NOTE — Telephone Encounter (Signed)
Pt returning call;pt contact number [phone]

## 2017-12-30 ENCOUNTER — Ambulatory Visit (INDEPENDENT_AMBULATORY_CARE_PROVIDER_SITE_OTHER): Payer: Medicare Other | Admitting: Pulmonary Disease

## 2017-12-30 ENCOUNTER — Ambulatory Visit (HOSPITAL_BASED_OUTPATIENT_CLINIC_OR_DEPARTMENT_OTHER)
Admission: RE | Admit: 2017-12-30 | Discharge: 2017-12-30 | Disposition: A | Discharge status: Home / Self Care | Payer: Medicare Other | Source: Ambulatory Visit | Attending: Pulmonary Disease | Admitting: Pulmonary Disease

## 2017-12-30 DIAGNOSIS — R918 Other nonspecific abnormal finding of lung field: Secondary | ICD-10-CM | POA: Diagnosis not present

## 2017-12-30 DIAGNOSIS — R05 Cough: Secondary | ICD-10-CM

## 2017-12-30 DIAGNOSIS — J849 Interstitial pulmonary disease, unspecified: Secondary | ICD-10-CM | POA: Insufficient documentation

## 2017-12-30 DIAGNOSIS — I2510 Atherosclerotic heart disease of native coronary artery without angina pectoris: Secondary | ICD-10-CM | POA: Insufficient documentation

## 2017-12-30 DIAGNOSIS — I700 Atherosclerosis of aorta: Secondary | ICD-10-CM | POA: Insufficient documentation

## 2017-12-30 DIAGNOSIS — R059 Cough, unspecified: Secondary | ICD-10-CM

## 2017-12-30 LAB — PULMONARY FUNCTION TEST
DL/VA % PRED: 100 %
DL/VA: 4.81 ml/min/mmHg/L
DLCO unc % pred: 66 %
DLCO unc: 16.06 ml/min/mmHg
FEF 25-75 POST: 1.84 L/s
FEF 25-75 Pre: 1.6 L/sec
FEF2575-%Change-Post: 15 %
FEF2575-%PRED-PRE: 91 %
FEF2575-%Pred-Post: 105 %
FEV1-%Change-Post: 10 %
FEV1-%PRED-PRE: 78 %
FEV1-%Pred-Post: 86 %
FEV1-Post: 1.88 L
FEV1-Pre: 1.7 L
FEV1FVC-%CHANGE-POST: 5 %
FEV1FVC-%Pred-Pre: 107 %
FEV6-%Change-Post: 5 %
FEV6-%Pred-Post: 80 %
FEV6-%Pred-Pre: 75 %
FEV6-Post: 2.2 L
FEV6-Pre: 2.09 L
FEV6FVC-%Change-Post: 0 %
FEV6FVC-%Pred-Post: 105 %
FEV6FVC-%Pred-Pre: 104 %
FVC-%Change-Post: 4 %
FVC-%PRED-PRE: 72 %
FVC-%Pred-Post: 76 %
FVC-POST: 2.2 L
FVC-PRE: 2.1 L
POST FEV6/FVC RATIO: 100 %
PRE FEV1/FVC RATIO: 81 %
PRE FEV6/FVC RATIO: 99 %
Post FEV1/FVC ratio: 85 %
RV % pred: 85 %
RV: 1.93 L
TLC % pred: 78 %
TLC: 3.95 L

## 2017-12-30 MED ORDER — BUDESONIDE-FORMOTEROL FUMARATE 80-4.5 MCG/ACT IN AERO: 2.0000 | INHALATION_SPRAY | Freq: Two times a day (BID) | RESPIRATORY_TRACT | 12 refills | Status: DC

## 2017-12-30 NOTE — Telephone Encounter (Signed)
80/4.5 2 puff bid

## 2017-12-30 NOTE — Telephone Encounter (Signed)
Spoke with the patient in regards to BQ rec's and she verbalized understanding at this time.Nothing further needed at this time.

## 2017-12-30 NOTE — Telephone Encounter (Signed)
PFT: can have while wearing monitor CT scan: should also be fine to have this but she should check with the clinic who assigned it

## 2017-12-30 NOTE — Telephone Encounter (Signed)
Attempted to call pt. I did not receive an answer. I have left a message for pt to return our call.  

## 2017-12-30 NOTE — Telephone Encounter (Signed)
Spoke with patient in the lobby currently waiting on BQ to advise. Patinet is aware of this and is okay to wait till BQ advises.

## 2017-12-30 NOTE — Telephone Encounter (Signed)
I have spoke with the patient  in office and is aware refill will be sent to [facility] [address]. I have also called the patient and left a detialed message on her voice mail.Nothing further needed at this time.

## 2017-12-30 NOTE — Telephone Encounter (Signed)
Patient is here in the lobby and wanted to speak to someone regarding the message she left yesterday.

## 2017-12-30 NOTE — Progress Notes (Signed)
PFT done today. 

## 2017-12-30 NOTE — Telephone Encounter (Signed)
Talk with the patient she is needing refill on her symbicort but does not know what strength of the Symbicort to send in a refill.  Pre the last ov this week she was to continue this inhaler but no strength noted.  [facility] [address]  [facility]Q please advise.

## 2017-12-31 ENCOUNTER — Other Ambulatory Visit: Payer: Self-pay | Admitting: Pulmonary Disease

## 2017-12-31 ENCOUNTER — Other Ambulatory Visit (INDEPENDENT_AMBULATORY_CARE_PROVIDER_SITE_OTHER): Payer: Medicare Other

## 2017-12-31 ENCOUNTER — Telehealth: Payer: Self-pay | Admitting: Pulmonary Disease

## 2017-12-31 DIAGNOSIS — J849 Interstitial pulmonary disease, unspecified: Secondary | ICD-10-CM

## 2017-12-31 LAB — C-REACTIVE PROTEIN: CRP: 0.4 mg/dL — ABNORMAL LOW (ref 0.5–20.0)

## 2017-12-31 LAB — SEDIMENTATION RATE: SED RATE: 46 mm/h — AB (ref 0–30)

## 2017-12-31 MED ORDER — AMOXICILLIN-POT CLAVULANATE 875-125 MG PO TABS: 1.0000 | ORAL_TABLET | Freq: Two times a day (BID) | ORAL | 0 refills | Status: DC

## 2017-12-31 NOTE — Telephone Encounter (Signed)
Spoke with pt. She is aware of BQ's response. States that she can't tolerate a Zpack, she can only take "Amoxicillin products."  BQ - please advise on the antibiotic. Thanks.

## 2017-12-31 NOTE — Telephone Encounter (Signed)
Called and spoke with patient, she is aware of BQ response, medication sent in, nothing further needed.

## 2017-12-31 NOTE — Telephone Encounter (Signed)
augmentin 875 bid x 5 days

## 2017-12-31 NOTE — Telephone Encounter (Signed)
Spoke with pt. States that she is not feeling well. Reports increased coughing and chest congestion. Cough is non productive. Denies chest tightness, wheezing, SOB or fever. Symptoms started after her last appointment with Dr. Lake Bells on 12/27/17. Pt wants something prescribed.  Dr. Lake Bells - please advise. Thanks.

## 2017-12-31 NOTE — Telephone Encounter (Signed)
Patient called and would like to have blood work to make sure she doesn't having an underlying issues - she can be reached at [phone] -pr

## 2017-12-31 NOTE — Telephone Encounter (Signed)
Blood work: My ILD panel  Can call in [facility]

## 2017-12-31 NOTE — Telephone Encounter (Signed)
Spoke with pt. States that she is not feeling well. Reports increased coughing and chest congestion. Cough is non productive. Denies chest tightness, wheezing, SOB or fever. Symptoms started after her last appointment with Dr. Lake Bells on 12/27/17. Pt wants something prescribed.   She would also now like blood work she feels she may have an underlying condition in her blood.  Dr. Lake Bells - please advise. Thanks.

## 2018-01-01 LAB — ANTI-JO 1 ANTIBODY, IGG: Anti JO-1: <0.2 AI (ref 0.0–0.9)

## 2018-01-02 DIAGNOSIS — J180 Bronchopneumonia, unspecified organism: Secondary | ICD-10-CM | POA: Diagnosis not present

## 2018-01-02 DIAGNOSIS — R05 Cough: Secondary | ICD-10-CM | POA: Diagnosis not present

## 2018-01-02 DIAGNOSIS — I700 Atherosclerosis of aorta: Secondary | ICD-10-CM | POA: Diagnosis not present

## 2018-01-04 ENCOUNTER — Ambulatory Visit: Payer: Medicare Other | Admitting: Pulmonary Disease

## 2018-01-04 DIAGNOSIS — J84112 Idiopathic pulmonary fibrosis: Secondary | ICD-10-CM

## 2018-01-04 DIAGNOSIS — J849 Interstitial pulmonary disease, unspecified: Secondary | ICD-10-CM | POA: Diagnosis not present

## 2018-01-04 DIAGNOSIS — Z87891 Personal history of nicotine dependence: Secondary | ICD-10-CM | POA: Diagnosis not present

## 2018-01-04 HISTORY — DX: Idiopathic pulmonary fibrosis: J84.112

## 2018-01-05 ENCOUNTER — Other Ambulatory Visit: Payer: Medicare Other

## 2018-01-05 DIAGNOSIS — J849 Interstitial pulmonary disease, unspecified: Secondary | ICD-10-CM | POA: Diagnosis not present

## 2018-01-05 LAB — RHEUMATOID FACTOR

## 2018-01-05 LAB — HYPERSENSITIVITY PNUEMONITIS PROFILE
ASPERGILLUS FUMIGATUS: NEGATIVE
FAENIA RETIVIRGULA: NEGATIVE
Pigeon Serum: NEGATIVE
S. VIRIDIS: NEGATIVE
T. CANDIDUS: NEGATIVE
T. VULGARIS: NEGATIVE

## 2018-01-05 LAB — ANTI-SCLERODERMA ANTIBODY: Scleroderma (Scl-70) (ENA) Antibody, IgG: <1 AI

## 2018-01-05 LAB — SJOGRENS SYNDROME-A EXTRACTABLE NUCLEAR ANTIBODY: SSA (RO) (ENA) ANTIBODY, IGG: NEGATIVE AI

## 2018-01-05 LAB — CYCLIC CITRUL PEPTIDE ANTIBODY, IGG: Cyclic Citrullin Peptide Ab: <16 UNITS

## 2018-01-05 LAB — SJOGRENS SYNDROME-B EXTRACTABLE NUCLEAR ANTIBODY: SSB (LA) (ENA) ANTIBODY, IGG: NEGATIVE AI

## 2018-01-05 LAB — ANA: Anti Nuclear Antibody(ANA): NEGATIVE

## 2018-01-05 LAB — ALDOLASE

## 2018-01-06 ENCOUNTER — Ambulatory Visit (INDEPENDENT_AMBULATORY_CARE_PROVIDER_SITE_OTHER): Payer: Medicare Other | Admitting: Family Medicine

## 2018-01-06 ENCOUNTER — Encounter (HOSPITAL_BASED_OUTPATIENT_CLINIC_OR_DEPARTMENT_OTHER): Payer: Self-pay | Admitting: *Deleted

## 2018-01-06 ENCOUNTER — Inpatient Hospital Stay (HOSPITAL_BASED_OUTPATIENT_CLINIC_OR_DEPARTMENT_OTHER)
Admission: EM | Admit: 2018-01-06 | Discharge: 2018-01-10 | DRG: 202 | Disposition: A | Discharge status: Home / Self Care | Payer: Medicare Other | Source: Ambulatory Visit | Attending: Internal Medicine | Admitting: Internal Medicine

## 2018-01-06 ENCOUNTER — Encounter: Payer: Self-pay | Admitting: Family Medicine

## 2018-01-06 ENCOUNTER — Emergency Department (HOSPITAL_BASED_OUTPATIENT_CLINIC_OR_DEPARTMENT_OTHER): Payer: Medicare Other

## 2018-01-06 ENCOUNTER — Other Ambulatory Visit: Payer: Self-pay

## 2018-01-06 VITALS — BP 136/86 | HR 88 | Temp 97.8°F | Resp 18 | Ht 64.0 in | Wt 138.0 lb

## 2018-01-06 DIAGNOSIS — Z885 Allergy status to narcotic agent status: Secondary | ICD-10-CM

## 2018-01-06 DIAGNOSIS — Z8673 Personal history of transient ischemic attack (TIA), and cerebral infarction without residual deficits: Secondary | ICD-10-CM | POA: Diagnosis not present

## 2018-01-06 DIAGNOSIS — J470 Bronchiectasis with acute lower respiratory infection: Secondary | ICD-10-CM | POA: Diagnosis not present

## 2018-01-06 DIAGNOSIS — E876 Hypokalemia: Secondary | ICD-10-CM | POA: Diagnosis not present

## 2018-01-06 DIAGNOSIS — Z7902 Long term (current) use of antithrombotics/antiplatelets: Secondary | ICD-10-CM

## 2018-01-06 DIAGNOSIS — J209 Acute bronchitis, unspecified: Principal | ICD-10-CM | POA: Diagnosis present

## 2018-01-06 DIAGNOSIS — K219 Gastro-esophageal reflux disease without esophagitis: Secondary | ICD-10-CM | POA: Diagnosis present

## 2018-01-06 DIAGNOSIS — E785 Hyperlipidemia, unspecified: Secondary | ICD-10-CM | POA: Diagnosis present

## 2018-01-06 DIAGNOSIS — K5909 Other constipation: Secondary | ICD-10-CM | POA: Diagnosis not present

## 2018-01-06 DIAGNOSIS — J9601 Acute respiratory failure with hypoxia: Secondary | ICD-10-CM

## 2018-01-06 DIAGNOSIS — J4541 Moderate persistent asthma with (acute) exacerbation: Secondary | ICD-10-CM | POA: Diagnosis not present

## 2018-01-06 DIAGNOSIS — R062 Wheezing: Secondary | ICD-10-CM

## 2018-01-06 DIAGNOSIS — Z801 Family history of malignant neoplasm of trachea, bronchus and lung: Secondary | ICD-10-CM

## 2018-01-06 DIAGNOSIS — Z90710 Acquired absence of both cervix and uterus: Secondary | ICD-10-CM | POA: Diagnosis not present

## 2018-01-06 DIAGNOSIS — J9600 Acute respiratory failure, unspecified whether with hypoxia or hypercapnia: Secondary | ICD-10-CM | POA: Diagnosis not present

## 2018-01-06 DIAGNOSIS — Z833 Family history of diabetes mellitus: Secondary | ICD-10-CM | POA: Diagnosis not present

## 2018-01-06 DIAGNOSIS — G43909 Migraine, unspecified, not intractable, without status migrainosus: Secondary | ICD-10-CM | POA: Diagnosis present

## 2018-01-06 DIAGNOSIS — Z8049 Family history of malignant neoplasm of other genital organs: Secondary | ICD-10-CM

## 2018-01-06 DIAGNOSIS — E039 Hypothyroidism, unspecified: Secondary | ICD-10-CM | POA: Diagnosis present

## 2018-01-06 DIAGNOSIS — J84112 Idiopathic pulmonary fibrosis: Secondary | ICD-10-CM | POA: Diagnosis not present

## 2018-01-06 DIAGNOSIS — Z8719 Personal history of other diseases of the digestive system: Secondary | ICD-10-CM

## 2018-01-06 DIAGNOSIS — J45901 Unspecified asthma with (acute) exacerbation: Secondary | ICD-10-CM | POA: Diagnosis not present

## 2018-01-06 DIAGNOSIS — Z87891 Personal history of nicotine dependence: Secondary | ICD-10-CM

## 2018-01-06 DIAGNOSIS — Z800 Family history of malignant neoplasm of digestive organs: Secondary | ICD-10-CM

## 2018-01-06 DIAGNOSIS — R0602 Shortness of breath: Secondary | ICD-10-CM

## 2018-01-06 DIAGNOSIS — Z823 Family history of stroke: Secondary | ICD-10-CM | POA: Diagnosis not present

## 2018-01-06 DIAGNOSIS — R011 Cardiac murmur, unspecified: Secondary | ICD-10-CM | POA: Diagnosis not present

## 2018-01-06 DIAGNOSIS — Z888 Allergy status to other drugs, medicaments and biological substances status: Secondary | ICD-10-CM

## 2018-01-06 DIAGNOSIS — Z8042 Family history of malignant neoplasm of prostate: Secondary | ICD-10-CM

## 2018-01-06 DIAGNOSIS — M1990 Unspecified osteoarthritis, unspecified site: Secondary | ICD-10-CM | POA: Diagnosis not present

## 2018-01-06 DIAGNOSIS — G459 Transient cerebral ischemic attack, unspecified: Secondary | ICD-10-CM | POA: Diagnosis not present

## 2018-01-06 DIAGNOSIS — J849 Interstitial pulmonary disease, unspecified: Secondary | ICD-10-CM | POA: Diagnosis present

## 2018-01-06 DIAGNOSIS — I110 Hypertensive heart disease with heart failure: Secondary | ICD-10-CM | POA: Diagnosis not present

## 2018-01-06 DIAGNOSIS — Z8249 Family history of ischemic heart disease and other diseases of the circulatory system: Secondary | ICD-10-CM

## 2018-01-06 DIAGNOSIS — Z79890 Hormone replacement therapy (postmenopausal): Secondary | ICD-10-CM

## 2018-01-06 DIAGNOSIS — R0600 Dyspnea, unspecified: Secondary | ICD-10-CM | POA: Diagnosis present

## 2018-01-06 DIAGNOSIS — Z96653 Presence of artificial knee joint, bilateral: Secondary | ICD-10-CM | POA: Diagnosis present

## 2018-01-06 DIAGNOSIS — I5189 Other ill-defined heart diseases: Secondary | ICD-10-CM

## 2018-01-06 DIAGNOSIS — Z886 Allergy status to analgesic agent status: Secondary | ICD-10-CM

## 2018-01-06 DIAGNOSIS — I5032 Chronic diastolic (congestive) heart failure: Secondary | ICD-10-CM | POA: Diagnosis not present

## 2018-01-06 DIAGNOSIS — Z8051 Family history of malignant neoplasm of kidney: Secondary | ICD-10-CM

## 2018-01-06 DIAGNOSIS — Z8041 Family history of malignant neoplasm of ovary: Secondary | ICD-10-CM

## 2018-01-06 DIAGNOSIS — R0902 Hypoxemia: Secondary | ICD-10-CM | POA: Diagnosis not present

## 2018-01-06 DIAGNOSIS — I252 Old myocardial infarction: Secondary | ICD-10-CM | POA: Diagnosis not present

## 2018-01-06 DIAGNOSIS — R0609 Other forms of dyspnea: Secondary | ICD-10-CM | POA: Diagnosis present

## 2018-01-06 LAB — CBC WITH DIFFERENTIAL/PLATELET
BASOS PCT: 1 %
Basophils Absolute: 0.1 10*3/uL (ref 0.0–0.1)
EOS PCT: 5 %
Eosinophils Absolute: 0.4 10*3/uL (ref 0.0–0.7)
HEMATOCRIT: 40.1 % (ref 36.0–46.0)
HEMOGLOBIN: 14 g/dL (ref 12.0–15.0)
LYMPHS ABS: 2.8 10*3/uL (ref 0.7–4.0)
LYMPHS PCT: 40 %
MCH: 32.9 pg (ref 26.0–34.0)
MCHC: 34.9 g/dL (ref 30.0–36.0)
MCV: 94.1 fL (ref 78.0–100.0)
MONOS PCT: 13 %
Monocytes Absolute: 0.9 10*3/uL (ref 0.1–1.0)
NEUTROS ABS: 2.9 10*3/uL (ref 1.7–7.7)
Neutrophils Relative %: 41 %
Platelets: 238 10*3/uL (ref 150–400)
RBC: 4.26 MIL/uL (ref 3.87–5.11)
RDW: 12.7 % (ref 11.5–15.5)
WBC: 7.1 10*3/uL (ref 4.0–10.5)

## 2018-01-06 LAB — ALDOLASE: Aldolase: 3.4 U/L (ref <=8.1)

## 2018-01-06 LAB — BASIC METABOLIC PANEL
ANION GAP: 13 (ref 5–15)
BUN: 16 mg/dL (ref 8–23)
CO2: 30 mmol/L (ref 22–32)
Calcium: 9.3 mg/dL (ref 8.9–10.3)
Chloride: 98 mmol/L (ref 98–111)
Creatinine, Ser: 0.96 mg/dL (ref 0.44–1.00)
GFR calc Af Amer: >60 mL/min (ref >60)
GFR calc non Af Amer: 57 mL/min — ABNORMAL LOW (ref >60)
GLUCOSE: 103 mg/dL — AB (ref 70–99)
POTASSIUM: 2.9 mmol/L — AB (ref 3.5–5.1)
Sodium: 141 mmol/L (ref 135–145)

## 2018-01-06 LAB — MAGNESIUM: Magnesium: 2.2 mg/dL (ref 1.7–2.4)

## 2018-01-06 MED ORDER — ALBUTEROL (5 MG/ML) CONTINUOUS INHALATION SOLN
10.0000 mg/h | INHALATION_SOLUTION | Freq: Once | RESPIRATORY_TRACT | Status: AC
  Administered 2018-01-06: 10 mg/h via RESPIRATORY_TRACT
  Filled 2018-01-06: qty 20

## 2018-01-06 MED ORDER — ALBUTEROL SULFATE (2.5 MG/3ML) 0.083% IN NEBU: 2.5000 mg | INHALATION_SOLUTION | RESPIRATORY_TRACT | Status: DC | PRN

## 2018-01-06 MED ORDER — SODIUM CHLORIDE 0.9 % IV SOLN
INTRAVENOUS | Status: DC | PRN
  Administered 2018-01-06: 250 mL via INTRAVENOUS

## 2018-01-06 MED ORDER — ALBUTEROL SULFATE (2.5 MG/3ML) 0.083% IN NEBU
2.5000 mg | INHALATION_SOLUTION | Freq: Once | RESPIRATORY_TRACT | Status: AC
  Administered 2018-01-06: 2.5 mg via RESPIRATORY_TRACT

## 2018-01-06 MED ORDER — METHYLPREDNISOLONE SODIUM SUCC 125 MG IJ SOLR
125.0000 mg | Freq: Once | INTRAMUSCULAR | Status: AC
  Administered 2018-01-06: 125 mg via INTRAVENOUS
  Filled 2018-01-06: qty 2

## 2018-01-06 MED ORDER — IOPAMIDOL (ISOVUE-370) INJECTION 76%
100.0000 mL | Freq: Once | INTRAVENOUS | Status: AC | PRN
  Administered 2018-01-06: 100 mL via INTRAVENOUS

## 2018-01-06 MED ORDER — ALBUTEROL SULFATE (2.5 MG/3ML) 0.083% IN NEBU
2.5000 mg | INHALATION_SOLUTION | Freq: Once | RESPIRATORY_TRACT | Status: AC
  Administered 2018-01-06: 2.5 mg via RESPIRATORY_TRACT
  Filled 2018-01-06: qty 3

## 2018-01-06 MED ORDER — POTASSIUM CHLORIDE CRYS ER 20 MEQ PO TBCR
40.0000 meq | EXTENDED_RELEASE_TABLET | Freq: Once | ORAL | Status: AC
  Administered 2018-01-06: 40 meq via ORAL
  Filled 2018-01-06: qty 2

## 2018-01-06 MED ORDER — POTASSIUM CHLORIDE 10 MEQ/100ML IV SOLN
10.0000 meq | Freq: Once | INTRAVENOUS | Status: AC
  Administered 2018-01-06: 10 meq via INTRAVENOUS
  Filled 2018-01-06: qty 100

## 2018-01-06 NOTE — ED Triage Notes (Signed)
Cough for a week. She was given an inhaler by her MD. She feels worse. Wheezing.

## 2018-01-06 NOTE — ED Notes (Signed)
Patient transported to CT 

## 2018-01-06 NOTE — ED Provider Notes (Addendum)
[facility] Provider Note   CSN: [id number] Arrival date & time: 01/06/18  1239     History   Chief Complaint Chief Complaint  Patient presents with  . Shortness of Breath    HPI Alexandria [name] is a 72 y.o. female past medical history of asthma, diverticular-itis, DVT, interstitial lung disease, thyroid disease who presents for evaluation of shortness of breath.  Patient reports is been ongoing issue for the last week and a half.  She states that she was seen by Dr. Lake Bells (pulmonary) on 12/30/17 for evaluation of symptoms.  At the time, she got some breathing treatments.  He had wanted to get a CT of her chest before deciding any further treatment.  She reports she does not know the results of that treatment.  Patient reports that over the last week, shortness of breath has worsened.  She reports that she went to urgent care 3 days ago where she got a steroid injection as well as breathing treatment.  She reports that slightly improved.  Patient reports that she additionally had to see her primary care doctor this morning for evaluation of shortness of breath.  On initial arrival at primary care doctor's office, patient reports that she was wheezing significantly and was having difficulty breathing.  Patient was giving a breathing treatment at primary care office and was sent to the ED for further evaluation.  She reports that Dr. Lake Bells had put her on 5 days course of Augmentin when he saw her which she states she completed.  She reports she has not had any prescriptions for any breathing treatments or nebulizers at this time.  She does report she is coughing but states it is not productive.  She reports that the shortness of breath is worse when she walks around.  Particular, she noticed when she walked upstairs, she was having a lot of difficulty breathing which she states is abnormal for her.  She denies any fevers, chest pain, abdominal pain,  nausea/vomiting.  Patient does report that she recently drove to and from [place] which she states was a one-way 14 hour car ride.  The history is provided by the patient.    Past Medical History:  Diagnosis Date  . Arthritis   . Asthmatic bronchitis with acute exacerbation 02/06/2015  . Bronchial pneumonia   . Colon polyp   . Diverticulitis   . DVT (deep venous thrombosis) ([place])    lower extremity  . H/O cardiac catheterization 2004   Normal coronary arteries  . Heart murmur   . History of stress test 05/21/2011  . Hx of echocardiogram 01/27/2010   Normal Ef 55% the transmitral spectral doppler flow pattern is normal for age. the left ventricular wall motion is normal  . Migraines   . Pancreatitis   . Thyroid disease   . TIA (transient ischemic attack)    8 years ago    Patient Active Problem List   Diagnosis Date Noted  . Acute respiratory failure ([place]) 01/06/2018  . Hypokalemia 12/15/2017  . Chronic cough 11/26/2017  . Basal cell carcinoma (BCC) of neck 06/07/2017  . Cardiac murmur 05/07/2017  . Diastolic dysfunction 97/67/3419  . Situational anxiety 05/07/2017  . Headache 05/07/2017  . Cellulitis of left lower extremity 04/30/2017  . Complicated migraine 37/90/2409  . Insomnia 11/18/2015  . Chest pain at rest 05/20/2015  . RBBB 05/20/2015  . Abnormal CXR 05/20/2015  . Hx of myocardial infarction 05/08/2015  . Bruising 05/08/2015  .  Upper airway cough syndrome 02/14/2015  . Asthmatic bronchitis with acute exacerbation 02/06/2015  . Hypothyroidism 10/25/2014  . Fatigue 10/25/2014  . 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 Chronic venous insufficiency 10/24/2014  . Varicose veins of leg with complications 44/04/270  . Degeneration of intervertebral disc of cervical region 06/29/2014  . Palpitations 11/08/2013  . SOB (shortness of breath) 10/03/2013  . DOE (dyspnea on exertion) 10/03/2013  . Edema of both legs 10/03/2013  . Hair loss 11/25/2012  .  Diverticulitis 07/14/2012  . Hammer toe 06/13/2012  . Recurrent abdominal pain 04/17/2012  . Hypoglycemia 01/06/2012    Past Surgical History:  Procedure Laterality Date  . BREAST BIOPSY     Isaiah Blakes  . CARDIAC CATHETERIZATION     10/2013  . LEFT HEART CATHETERIZATION WITH CORONARY ANGIOGRAM N/A 10/25/2013   Procedure: LEFT HEART CATHETERIZATION WITH CORONARY ANGIOGRAM;  Surgeon: Troy Sine, MD;  Location: [facility] CATH LAB;  Service: Cardiovascular;  Laterality: N/A;  . NECK SURGERY     x's 2  . TOTAL KNEE ARTHROPLASTY     Bilateral x's 2  . VAGINAL HYSTERECTOMY  10/17/1998   Dory Horn     OB History   None      Home Medications    Prior to Admission medications   Medication Sig Start Date End Date Taking? Authorizing Provider  amoxicillin-clavulanate (AUGMENTIN) 875-125 MG tablet Take 1 tablet by mouth 2 (two) times daily. 12/31/17   [name], MD  Ascorbic Acid (VITAMIN C PO) Vitamin C 500mg -Take 2-4 capsules by mouth daily.    [provider]  Biotin (RA BIOTIN) 1000 MCG tablet Take 1,000 mcg by mouth 2 (two) times daily.     [provider]  BREWERS YEAST PO Take 2 tablets by mouth 2 (two) times daily.     [provider]  budesonide-formoterol (SYMBICORT) 80-4.5 MCG/ACT inhaler Inhale 2 puffs into the lungs 2 (two) times daily. 12/30/17   [name], MD  clopidogrel (PLAVIX) 75 MG tablet TAKE 1 TABLET BY MOUTH EVERY DAY 10/11/17   Carollee Herter, Alferd Apa, DO  dicyclomine (BENTYL) 20 MG tablet TAKE 1 TABLET BY MOUTH FOUR TIMES A DAY AS NEEDED 12/17/17   Carollee Herter, Alferd Apa, DO  FOLIC ACID PO Folic Acid 50mg -Take 1 tablet by mouth daily.    [provider]  furosemide (LASIX) 40 MG tablet TAKE 1 TABLET BY MOUTH TWICE A DAY 09/13/17   Lowne Chase, Yvonne R, DO  KLOR-CON M20 20 MEQ tablet TAKE 1 TABLET BY MOUTH TWICE A DAY 06/21/17   Copland, Gay Filler, MD  lactulose, encephalopathy, (CHRONULAC) 10 GM/15ML SOLN TAKE 30 MLS (20 G  TOTAL) BY MOUTH EVERY 6 (SIX) HOURS AS NEEDED. 03/29/17   Carollee Herter, Alferd Apa, DO  levofloxacin (LEVAQUIN) 750 MG tablet Take 750 mg by mouth daily.    [provider]  Multiple Vitamin (MULTIVITAMIN) tablet Take 1 tablet by mouth daily.    [provider]  OVER THE COUNTER MEDICATION Omega-3 Fatty acids-Vitamin E (fish oil) 1 capsule by mouth daily.    [provider]  promethazine-codeine (PHENERGAN WITH CODEINE) 6.25-10 MG/5ML syrup Take by mouth every 6 (six) hours as needed for cough.    [provider]  SYNTHROID 112 MCG tablet TAKE 1 TABLET BY MOUTH EVERY DAY BEFORE BREAKFAST 09/13/17   Carollee Herter, Kendrick Fries R, DO  vitamin D, CHOLECALCIFEROL, 400 UNITS tablet Take 400 Units by mouth 2 (two) times daily.  [provider]    Family History Family History  Problem Relation Age of Onset  . Arthritis Unknown   . Colon cancer Father   . Prostate cancer Father   . HIV Brother        95  . Kidney cancer Brother   . Colon cancer Brother   . Lung cancer Brother   . Other Brother        Mouth Cancer  . Ovarian cancer Mother   . Uterine cancer Mother   . Heart disease Maternal Grandmother   . Stroke Maternal Grandmother   . Hypertension Maternal Grandmother   . Diabetes Maternal Grandmother     Social History Social History   Tobacco Use  . Smoking status: Former Smoker    Years: 2.00    Last attempt to quit: 04/06/1969    Years since quitting: 48.7  . Smokeless tobacco: Never Used  . Tobacco comment: socially smoked x 2 years  Substance Use Topics  . Alcohol use: No    Alcohol/week: 0.0 standard drinks  . Drug use: No     Allergies   Prednisone; Atrovent hfa [ipratropium bromide hfa]; Cheratussin ac [guaifenesin-codeine]; Codeine; Hydrocodone; Influenza vaccines; and Motrin [ibuprofen]   Review of Systems Review of Systems  Constitutional: Negative for fever.  Respiratory: Negative for cough and shortness of breath.     Cardiovascular: Negative for chest pain.  Gastrointestinal: Negative for abdominal pain, nausea and vomiting.  Genitourinary: Negative for dysuria and hematuria.  Neurological: Negative for headaches.     Physical Exam Updated Vital Signs BP (!) 116/57   Pulse 96   Temp 98.2 F (36.8 C) (Oral)   Resp (!) 22   Ht 5\' 4"  (1.626 m)   Wt 62.5 kg   SpO2 94%   BMI 23.65 kg/m   Physical Exam  Constitutional: She is oriented to person, place, and time. She appears well-developed and well-nourished.  Appears uncomfortable but no acute distress   HENT:  Head: Normocephalic and atraumatic.  Mouth/Throat: Oropharynx is clear and moist and mucous membranes are normal.  Eyes: Pupils are equal, round, and reactive to light. Conjunctivae, EOM and lids are normal.  Neck: Full passive range of motion without pain.  Cardiovascular: Normal rate, regular rhythm, normal heart sounds and normal pulses. Exam reveals no gallop and no friction rub.  No murmur heard. Pulmonary/Chest: Effort normal. Tachypnea noted. She has decreased breath sounds. She has wheezes. She has rhonchi.  Increased work of breathing noted.  Patient speaking very short sentences gasping.  Audible wheezing heard.  Diffuse rhonchi heard throughout.  Abdominal: Soft. Normal appearance. There is no tenderness. There is no rigidity and no guarding.  Musculoskeletal: Normal range of motion.  Lateral lower extremities are symmetric in appearance without any overlying warmth, erythema, edema.  Neurological: She is alert and oriented to person, place, and time.  Skin: Skin is warm and dry. Capillary refill takes less than 2 seconds.  Psychiatric: She has a normal mood and affect. Her speech is normal.  Nursing note and vitals reviewed.    ED Treatments / Results  Labs (all labs ordered are listed, but only abnormal results are displayed) Labs Reviewed  BASIC METABOLIC PANEL - Abnormal; Notable for the following components:       Result Value   Potassium 2.9 (*)    Glucose, Bld 103 (*)    GFR calc non Af Amer 57 (*)    All other components within normal limits  CBC WITH  DIFFERENTIAL/PLATELET  MAGNESIUM    EKG EKG Interpretation  Date/Time:  Thursday January 06 2018 12:57:02 EDT Ventricular Rate:  81 PR Interval:  216 QRS Duration: 116 QT Interval:  402 QTC Calculation: 466 R Axis:   24 Text Interpretation:  sinus rhythm  with PVC Low voltage QRS Incomplete right bundle branch block T wave abnormality, consider anterior ischemia Abnormal ECG When compared to prior, more artifact present, similar RBBB>  NO STEMI Confirmed by Antony Blackbird [phone]) on 01/06/2018 2:37:16 PM   Radiology Ct Angio Chest Pe W And/or Wo Contrast  Result Date: 01/06/2018 CLINICAL DATA:  Cough and shortness of breath for 11 days. EXAM: CT ANGIOGRAPHY CHEST WITH CONTRAST TECHNIQUE: Multidetector CT imaging of the chest was performed using the standard protocol during bolus administration of intravenous contrast. Multiplanar CT image reconstructions and MIPs were obtained to evaluate the vascular anatomy. CONTRAST:  129mL ISOVUE-370 IOPAMIDOL (ISOVUE-370) INJECTION 76% COMPARISON:  CT chest 12/30/2017. FINDINGS: Cardiovascular: Satisfactory opacification of the pulmonary arteries to the segmental level. No evidence of pulmonary embolism. Normal heart size. No pericardial effusion. Aortic atherosclerosis. Aortic valve calcifications. Coronary artery calcification. Ectasia of ascending thoracic aorta. Mediastinum/Nodes: No enlarged mediastinal, hilar, or axillary lymph nodes. Thyroid gland, trachea, and esophagus demonstrate no significant findings. Lungs/Pleura: Multiple small calcified and noncalcified pulmonary nodules are noted, unchanged from recent CT. No acute consolidative airspace disease. Peripheral predominant areas of septal thickening and bronchiectasis. Upper Abdomen: Unremarkable. Musculoskeletal: No chest wall abnormality. No acute  or significant osseous findings. Review of the MIP images confirms the above findings. IMPRESSION: No evidence of PE. No acute consolidative airspace disease. Stable mild interstitial lung disease. 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 Recommend annual imaging followup by CTA or MRA. This recommendation follows 2010 ACCF/AHA/AATS/ACR/ASA/SCA/SCAI/SIR/STS/SVM Guidelines for the Diagnosis and Management of Patients with Thoracic Aortic Disease. Circulation. 2010; 121: I338-S505. Aortic Atherosclerosis (ICD10-I70.0). Electronically Signed   By: Staci Righter M.D.   On: 01/06/2018 14:31    Procedures Procedures (including critical care time)  Medications Ordered in ED Medications  0.9 %  sodium chloride infusion ( Intravenous Stopped 01/06/18 1625)  methylPREDNISolone sodium succinate (SOLU-MEDROL) 125 mg/2 mL injection 125 mg (125 mg Intravenous Given 01/06/18 1328)  albuterol (PROVENTIL) (2.5 MG/3ML) 0.083% nebulizer solution 2.5 mg (2.5 mg Nebulization Given 01/06/18 1322)  iopamidol (ISOVUE-370) 76 % injection 100 mL (100 mLs Intravenous Contrast Given 01/06/18 1414)  potassium chloride 10 mEq in 100 mL IVPB ( Intravenous Stopped 01/06/18 1625)  albuterol (PROVENTIL,VENTOLIN) solution continuous neb (10 mg/hr Nebulization Given 01/06/18 1530)     Initial Impression / Assessment and Plan / ED Course  I have reviewed the triage vital signs and the nursing notes.  Pertinent labs & imaging results that were available during my care of the patient were reviewed by me and considered in my medical decision making (see chart for details).     72 year old female presenting for evaluation of shortness of breath and cough that has been progressively worsening over last week.  Was being seen upstairs by primary care doctor when noted to have significant wheezing.  Was given a nebulizer treatment at the office but was not improving was sent to the ED for further evaluation.  Reports that she  recently seen saw Dr. Lake Bells for evaluation of her chronic cough.  Reports shortness of breath has been progressive worsening over last week.  Reports that she was given Symbicort but does not have any breathing treatments.  Reports she went to  urgent care and got a steroid injection and nebulizer treatment which slightly improved but then states that symptoms returned.  Patient does report she recently drove down to [place] which was a 14-hour trip one way.  On initial ED arrival, patient is afebrile, slightly hypertensive.  She is tachypneic with signs of difficulty breathing.  She is wheezing throughout all lung fields with decreased breath sounds and rhonchi noted.  She is speaking in very short sentences and is audibly wheezing on my exam.  Nebulizer treatment, steroids ordered.  We will plan for additional labs.  Patient recently had a chest x-ray this morning.  Review of records show that when she saw Dr. Anastasia Pall office, he had prescribed Symbicort for her symptoms.  At that time, he was concerned about bronchiectasis and had ordered the CT scan of the chest. She has CT chest done on 12/30/17.  At that time, she had findings consistent with interstitial lung disease.  There was no other acute abnormality noted.  After reviewing chest CT.  It was on a CTA that look at the vasculature.  Given recent long car ride and simply worsening breathing today, discussed with Dr. Sherry Ruffing who agrees with for plans of CTA for further evaluation.  CTA negative for any acute PE.  No evidence of consolidation that would be concerning for infection.  She does have evidence of interstitial lung disease.  Discussed results with patient.  She reports some improvement after nebulizer treatment here in the ED but states that she is still having significant difficulty breathing.  Reevaluation she still wheezing.  We will plan to put her on a continuous nebulizer.  Reevaluation of her continuous nebulizer.  Patient  reports that she does not have any shortness of breath as long as she sits still and does not talk.  She reports that when she moves or starts talking, she has difficulty breathing.  On my reevaluation, she is still wheezing significantly.  I suspect that she will need admission for chronic nebulizer treatments and evaluation by pulmonary.  We will plan for admission. Patient wishes [facility] be [facility]. Given that she has seen [facility], feel that it would be best for [facility] admission to continue treatment.   Discussed patient with hospitalist. Agrees with admission.   Final Clinical Impressions(s) / ED Diagnoses   Final diagnoses:  Shortness of breath    ED Discharge Orders    None       Volanda Napoleon, PA-C 01/06/18 1938    Volanda Napoleon, PA-C 01/06/18 1950    Tegeler, Gwenyth Allegra, MD 01/08/18 1453

## 2018-01-06 NOTE — Progress Notes (Signed)
[facility] request for admission to [facility] from [facility]  72yow followed by Dr. Lake Bells for chronic cough, CT concerning for ILD. Has had outpatient abx and steroids without improvement. Wheezing with mild resp distress at [facility].  Afebrile, VSS, not hypoxic but dyspneic and not responding to tx in ED; still with significant wheezing. Non-toxic per EDP.  Will plan on observation to medical bed and pulmonary consult 10/4.  Murray Hodgkins, MD [facility] [phone]

## 2018-01-06 NOTE — H&P (Signed)
History and Physical    Alexandria [name] MEQ:[id number] DOB: [birth year] DOA: 01/06/2018  Referring MD/NP/PA:   PCP: [name], DO   Patient coming from:  The patient is coming from home.  At baseline, pt is independent for most of ADL.        Chief Complaint: cough, SOB  HPI: Alexandria [name] is a 72 y.o. female with medical history significant of TIA, hypothyroidism, DVT, who presents with cough, shortness of breath.  Patient states that she has been having cough and shortness of breath for almost 2 weeks, which has been progressively getting worse.  She denies chest pain, fever or chills. Patient was seen by Dr. Olin Pia for pulmonology on 9/23, concerned for bronchiectasis. He ordered lung function test and high-resolution CT scan. Pt was instructed to continue Symbicort inhaler. 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 She did not go back to see Dr. Olin Pia yet after CT scan was done. Pt states that she continues to have cough and shortness of breath, mostly dry cough. She was seen in UC on 12/27/17 and put on Augmentin for 5 days without improvement, and was seen in urgent care again on 10/1, and switched antibiotic to Levaquin. She states that her symptoms has been worsening. She continues to have dry cough, shortness breath, she can barely speak in full sentence. Still no chest pain, fever or chills. Patient denies nausea, vomiting, diarrhea, abdominal pain, symptoms of UTI or unilateral weakness. Of note, per dr. Romero Belling note, pt was previously diagnosed as asthmatic bronchitis which may in fact be the case considering her increased exhaled nitric oxide test.  ED Course: pt was found to have WBC 7.1, potassium 2.9, creatinine normal, temperature normal, tachycardia, tachypnea, oxygen saturating 91-95% on room air, patient is placed on telemetry bed for  observation. CT angiogram of chest is negative for PE, showed mild interstitial lung disease.  Review of Systems:   General: no fevers, chills, no body weight gain, has fatigue HEENT: no blurry vision, hearing changes or sore throat Respiratory: has dyspnea, coughing, wheezing CV: no chest pain, no palpitations GI: no nausea, vomiting, abdominal pain, diarrhea, constipation GU: no dysuria, burning on urination, increased urinary frequency, hematuria  Ext: has leg edema Neuro: no unilateral weakness, numbness, or tingling, no vision change or hearing loss Skin: no rash, no skin tear. MSK: No muscle spasm, no deformity, no limitation of range of movement in spin Heme: No easy bruising.  Travel history: No recent long distant travel.  Allergy:  Allergies  Allergen Reactions  . Prednisone Other (See Comments)    Changed personality   . Atrovent Hfa [Ipratropium Bromide Hfa] Other (See Comments)    Pt could not sleep  . Cheratussin Ac [Guaifenesin-Codeine]   . Codeine Other (See Comments)    Pt takes promethazine with codeine at home  . Hydrocodone Itching  . Influenza Vaccines Other (See Comments)    Pt reports heart attack after last flu shot  . Motrin [Ibuprofen] Other (See Comments)    "Gives false reading in blood"    Past Medical History:  Diagnosis Date  . Arthritis   . Asthmatic bronchitis with acute exacerbation 02/06/2015  . Bronchial pneumonia   . Colon polyp   . Diverticulitis   . DVT (deep venous thrombosis) ([place])    lower extremity  . H/O cardiac catheterization 2004   Normal coronary arteries  .  Heart murmur   . History of stress test 05/21/2011  . Hx of echocardiogram 01/27/2010   Normal Ef 55% the transmitral spectral doppler flow pattern is normal for age. the left ventricular wall motion is normal  . Migraines   . Pancreatitis   . Thyroid disease   . TIA (transient ischemic attack)    8 years ago    Past Surgical History:  Procedure Laterality  Date  . BREAST BIOPSY     [facility]  . CARDIAC CATHETERIZATION     10/2013  . LEFT HEART CATHETERIZATION WITH CORONARY ANGIOGRAM N/A 10/25/2013   Procedure: LEFT HEART CATHETERIZATION WITH CORONARY ANGIOGRAM;  Surgeon: Troy Sine, MD;  Location: [facility];  Service: Cardiovascular;  Laterality: N/A;  . NECK SURGERY     x's 2  . TOTAL KNEE ARTHROPLASTY     Bilateral x's 2  . VAGINAL HYSTERECTOMY  10/17/1998   [place]    Social History:  reports that she quit smoking about 48 years ago. She quit after 2.00 years of use. She has never used smokeless tobacco. She reports that she does not drink alcohol or use drugs.  Family History:  Family History  Problem Relation Age of Onset  . Arthritis Unknown   . Colon cancer Father   . Prostate cancer Father   . HIV Brother        25  . Kidney cancer Brother   . Colon cancer Brother   . Lung cancer Brother   . Other Brother        Mouth Cancer  . Ovarian cancer Mother   . Uterine cancer Mother   . Heart disease Maternal Grandmother   . Stroke Maternal Grandmother   . Hypertension Maternal Grandmother   . Diabetes Maternal Grandmother      Prior to Admission medications   Medication Sig Start Date End Date Taking? Authorizing Provider  amoxicillin-clavulanate (AUGMENTIN) 875-125 MG tablet Take 1 tablet by mouth 2 (two) times daily. 12/31/17   [name], MD  Ascorbic Acid (VITAMIN C PO) Vitamin C 500mg -Take 2-4 capsules by mouth daily.    [provider]  Biotin (RA BIOTIN) 1000 MCG tablet Take 1,000 mcg by mouth 2 (two) times daily.     [provider]  BREWERS YEAST PO Take 2 tablets by mouth 2 (two) times daily.     [provider]  budesonide-formoterol (SYMBICORT) 80-4.5 MCG/ACT inhaler Inhale 2 puffs into the lungs 2 (two) times daily. 12/30/17   [name], MD  clopidogrel (PLAVIX) 75 MG tablet TAKE 1 TABLET BY MOUTH EVERY DAY 10/11/17   Carollee Herter, Alferd Apa, DO  dicyclomine (BENTYL)  20 MG tablet TAKE 1 TABLET BY MOUTH FOUR TIMES A DAY AS NEEDED 12/17/17   Carollee Herter, Alferd Apa, DO  FOLIC ACID PO Folic Acid 50mg -Take 1 tablet by mouth daily.    [provider]  furosemide (LASIX) 40 MG tablet TAKE 1 TABLET BY MOUTH TWICE A DAY 09/13/17   Lowne Chase, Yvonne R, DO  KLOR-CON M20 20 MEQ tablet TAKE 1 TABLET BY MOUTH TWICE A DAY 06/21/17   Copland, Gay Filler, MD  lactulose, encephalopathy, (CHRONULAC) 10 GM/15ML SOLN TAKE 30 MLS (20 G TOTAL) BY MOUTH EVERY 6 (SIX) HOURS AS NEEDED. 03/29/17   Carollee Herter, Alferd Apa, DO  levofloxacin (LEVAQUIN) 750 MG tablet Take 750 mg by mouth daily.    [provider]  Multiple Vitamin (MULTIVITAMIN) tablet Take 1 tablet by mouth  daily.    [provider]  OVER THE COUNTER MEDICATION Omega-3 Fatty acids-Vitamin E (fish oil) 1 capsule by mouth daily.    [provider]  promethazine-codeine (PHENERGAN WITH CODEINE) 6.25-10 MG/5ML syrup Take by mouth every 6 (six) hours as needed for cough.    [provider]  SYNTHROID 112 MCG tablet TAKE 1 TABLET BY MOUTH EVERY DAY BEFORE BREAKFAST 09/13/17   Carollee Herter, Kendrick Fries R, DO  vitamin D, CHOLECALCIFEROL, 400 UNITS tablet Take 400 Units by mouth 2 (two) times daily.     [provider]    Physical Exam: Vitals:   01/06/18 1900 01/06/18 1914 01/06/18 2000 01/06/18 2145  BP: (!) 116/57  124/62 140/66  Pulse: 96  99 76  Resp: (!) 22  (!) 21 20  Temp:  98.2 F (36.8 C)  97.9 F (36.6 C)  TempSrc:  Oral  Oral  SpO2: 94%  94% 95%  Weight:    61.2 kg  Height:    5\' 4"  (1.626 m)   General: Not in acute distress HEENT:       Eyes: PERRL, EOMI, no scleral icterus.       ENT: No discharge from the ears and nose, no pharynx injection, no tonsillar enlargement.        Neck: No JVD, no bruit, no mass felt. Heme: No neck lymph node enlargement. Cardiac: S1/S2, RRR, No murmurs, No gallops or rubs. Respiratory: has diffuse coarse breathing sound, rhonchi,  wheezing bilaterally GI: Soft, nondistended, nontender, no rebound pain, no organomegaly, BS present. GU: No hematuria Ext: 1+ pitting leg edema bilaterally. 2+DP/PT pulse bilaterally. Musculoskeletal: No joint deformities, No joint redness or warmth, no limitation of ROM in spin. Skin: No rashes.  Neuro: Alert, oriented X3, cranial nerves II-XII grossly intact, moves all extremities normally.  Psych: Patient is not psychotic, no suicidal or hemocidal ideation.  Labs on Admission: I have personally reviewed following labs and imaging studies  CBC: Recent Labs  Lab 01/06/18 1324  WBC 7.1  NEUTROABS 2.9  HGB 14.0  HCT 40.1  MCV 94.1  PLT 952   Basic Metabolic Panel: Recent Labs  Lab 01/06/18 1324  NA 141  K 2.9*  CL 98  CO2 30  GLUCOSE 103*  BUN 16  CREATININE 0.96  CALCIUM 9.3  MG 2.2   GFR: Estimated Creatinine Clearance: 45.1 mL/min (by C-G formula based on SCr of 0.96 mg/dL). Liver Function Tests: No results for input(s): AST, ALT, ALKPHOS, BILITOT, PROT, ALBUMIN in the last 168 hours. No results for input(s): LIPASE, AMYLASE in the last 168 hours. No results for input(s): AMMONIA in the last 168 hours. Coagulation Profile: No results for input(s): INR, PROTIME in the last 168 hours. Cardiac Enzymes: No results for input(s): CKTOTAL, CKMB, CKMBINDEX, TROPONINI in the last 168 hours. BNP (last 3 results) No results for input(s): PROBNP in the last 8760 hours. HbA1C: No results for input(s): HGBA1C in the last 72 hours. CBG: No results for input(s): GLUCAP in the last 168 hours. Lipid Profile: No results for input(s): CHOL, HDL, LDLCALC, TRIG, CHOLHDL, LDLDIRECT in the last 72 hours. Thyroid Function Tests: No results for input(s): TSH, T4TOTAL, FREET4, T3FREE, THYROIDAB in the last 72 hours. Anemia Panel: No results for input(s): VITAMINB12, FOLATE, FERRITIN, TIBC, IRON, RETICCTPCT in the last 72 hours. Urine analysis:    Component Value Date/Time    COLORURINE YELLOW 04/09/2016 1326   APPEARANCEUR CLEAR 04/09/2016 1326   LABSPEC 1.004 (L) 04/09/2016 1326  PHURINE 7.5 04/09/2016 1326   GLUCOSEU NEGATIVE 04/09/2016 1326   HGBUR NEGATIVE 04/09/2016 1326   BILIRUBINUR neg 08/24/2017 1210   KETONESUR NEGATIVE 04/09/2016 1326   PROTEINUR Negative 08/24/2017 1210   PROTEINUR NEGATIVE 04/09/2016 1326   UROBILINOGEN 0.2 08/24/2017 1210   NITRITE neg 08/24/2017 1210   NITRITE NEGATIVE 04/09/2016 1326   LEUKOCYTESUR Negative 08/24/2017 1210   Sepsis Labs: @LABRCNTIP (procalcitonin:4,lacticidven:4) )No results found for this or any previous visit (from the past 240 hour(s)).   Radiological Exams on Admission: Ct Angio Chest Pe W And/or Wo Contrast  Result Date: 01/06/2018 CLINICAL DATA:  Cough and shortness of breath for 11 days. EXAM: CT ANGIOGRAPHY CHEST WITH CONTRAST TECHNIQUE: Multidetector CT imaging of the chest was performed using the standard protocol during bolus administration of intravenous contrast. Multiplanar CT image reconstructions and MIPs were obtained to evaluate the vascular anatomy. CONTRAST:  145mL ISOVUE-370 IOPAMIDOL (ISOVUE-370) INJECTION 76% COMPARISON:  CT chest 12/30/2017. FINDINGS: Cardiovascular: Satisfactory opacification of the pulmonary arteries to the segmental level. No evidence of pulmonary embolism. Normal heart size. No pericardial effusion. Aortic atherosclerosis. Aortic valve calcifications. Coronary artery calcification. Ectasia of ascending thoracic aorta. Mediastinum/Nodes: No enlarged mediastinal, hilar, or axillary lymph nodes. Thyroid gland, trachea, and esophagus demonstrate no significant findings. Lungs/Pleura: Multiple small calcified and noncalcified pulmonary nodules are noted, unchanged from recent CT. No acute consolidative airspace disease. Peripheral predominant areas of septal thickening and bronchiectasis. Upper Abdomen: Unremarkable. Musculoskeletal: No chest wall abnormality. No acute or  significant osseous findings. Review of the MIP images confirms the above findings. IMPRESSION: No evidence of PE. No acute consolidative airspace disease. Stable mild interstitial lung disease. 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 Recommend annual imaging followup by CTA or MRA. This recommendation follows 2010 ACCF/AHA/AATS/ACR/ASA/SCA/SCAI/SIR/STS/SVM Guidelines for the Diagnosis and Management of Patients with Thoracic Aortic Disease. Circulation. 2010; 121: Z610-R604. Aortic Atherosclerosis (ICD10-I70.0). Electronically Signed   By: Staci Righter M.D.   On: 01/06/2018 14:31     EKG: Independently reviewed. Sinus rhythm, QTC 487, right bundle blockage, early R-wave progression  Assessment/Plan Principal Problem:   Acute respiratory failure (HCC) Active Problems:   Hypothyroidism   Asthmatic bronchitis with acute exacerbation   Diastolic dysfunction   Hypokalemia   TIA (transient ischemic attack)   Interstitial lung disease (HCC)   Acute respiratory failure: possibly due to ILD vs. acute bronchitis versus asthmatic bronchitis with acute exacerbation.  Patient failed outpatient treatment with Augmentin and 2 days of Levaquin plus inhaler.  Patient does not have fever or leukocytosis, clinically no pneumonia.  CT angiogram is negative for PE but showed mild interstitial lung disease.  -will place on tele bed for obs -Nebulizers: scheduled Spiriva inhaler and prn Xopenex Nebs -Solu-Medrol 60 mg IV tid -doxycycline for 5 days -cough syrup for cough  -Follow up sputum culture, respiratory virus panel -Nasal cannula oxygen as needed to maintain O2 saturation 92% or greater -start protonix 40 mg bid for possible gerd  Hypothyroidism: -continue synthroid  Diastolic dysfunction: 2D echo on 06/15/2017 showed EF of 55-60% with grade 1 diastolic dysfunction.  Patient has 1+ leg edema, but no JVD.  No pulmonary edema on CT scan of chest.  CHF seems to be  compensated. -Continue home dose Lasix. -Check BNP  Hypokalemia: K= 2.9 on admission. - Repleted - Check Mg level - Give 1 g of magnesium sulfate  Hx of TIA (transient ischemic attack): - continue plavix   DVT ppx: SQ Lovenox Code Status: Full code Family  Communication: None at bed side.   Disposition Plan:  Anticipate discharge back to previous home environment Consults called:  none Admission status: Obs / tele      Date of Service 01/07/2018    Ivor Costa [facility] Pager [phone]  If 7PM-7AM, please contact night-coverage [email or web address] Password [email or web address] 01/07/2018, 3:12 AM

## 2018-01-06 NOTE — Progress Notes (Signed)
[facility] [address], [place], [place] [address] [phone]  Date:  01/06/2018   Name:  Alexandria [name]   DOB:  1945-10-07   MRN:  [id number]  PCP:  [name], DO    Chief Complaint: URI (wheezing, coughing, congestion, saw pulmonology-given antibioitc and inhaler, seen also at walkin clinic-2 rounds of prednisone-4 breathing treatments advised to go to hospital)   History of Present Illness:  Alexandria [name] is a 72 y.o. very pleasant female patient who presents with the following:  Pt of Dr. Etter Sjogren who I have seen about a year ago  She has ILD< her pulmonologist is [place] Bells- he saw her on 9/23 Discussion: Alexandria [name] returns to my clinic today for evaluation of a dry hacking cough associated with shortness of breath.  This is previously been diagnosed as asthmatic bronchitis which may in fact be the case considering her increased exhaled nitric oxide test. That being said, I am concerned about the abnormality on physical exam and that she has coarse crackles in the right base of her lungs and had a chest x-ray which showed possible chronic bronchitis changes there.  This raises concern for bronchiectasis.  If this is the case then it is unlikely we will ever be able to stop her cough altogether. Plan: Chronic cough: For now continue using Symbicort 2 puffs twice a day I am concerned about a condition called bronchiectasis I am going to check a lung function test and a high-resolution CT scan of your chest Gastroesophageal reflux disease: Because you feel that papaya is quite helpful for this I would asked that she take it every day for a month to see if this helps with the severity of the cough  CT 9/26: IMPRESSION: 1. There are findings in the lungs compatible with interstitial lung disease, and based on the spectrum of findings this is classified as ATS probable usual interstitial pneumonia  (UIP). This is not yet definitive, particularly in light of the absence of honeycombing, lack of traction bronchiectasis and presence of mild air trapping, with primary differential consideration that of fibrotic phase nonspecific interstitial pneumonia (NSIP). Repeat high-resolution chest CT is recommended in 12 months to assess for temporal changes in the appearance of the lung parenchyma. 2. Aortic atherosclerosis, in addition to left anterior descending coronary artery disease. Assessment for potential risk factor modification, dietary therapy or pharmacologic therapy may be warranted, if clinically indicated. 3. There are calcifications of the aortic valve. Echocardiographic correlation for evaluation of potential valvular dysfunction may be warranted if clinically indicated. 4. 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 Recommend annual imaging followup by CTA or MRA. This recommendation follows 2010 ACCF/AHA/AATS/ACR/ASA/SCA/SCAI/SIR/STS/SVM Guidelines for the Diagnosis and Management of Patients with Thoracic Aortic Disease. Circulation. 2010; 121: E952-W413.  McQauid called in augmentin on 9/27 She has been seen at [facility] on both 9/29 and on 10/1-  She got steroid shot both times but not po prednisone - she does not tolerate this wel She was given levaquin on 10/1 to add to her augmentin She is also on albuterol and symbicort still Last used her albuterol this am She does not have a home neb machine but I have encouraged her to get one when she can  Allergic/ intolerant to atrovent   She is continuing to get worse   Patient Active Problem List   Diagnosis Date Noted  . Hypokalemia 12/15/2017  . Chronic  cough 11/26/2017  . Basal cell carcinoma (BCC) of neck 06/07/2017  . Cardiac murmur 05/07/2017  . Diastolic dysfunction 57/32/2025  . Situational anxiety 05/07/2017  . Headache 05/07/2017  . Cellulitis of left lower extremity 04/30/2017  . Complicated  migraine 42/70/6237  . Insomnia 11/18/2015  . Chest pain at rest 05/20/2015  . RBBB 05/20/2015  . Abnormal CXR 05/20/2015  . Hx of myocardial infarction 05/08/2015  . Bruising 05/08/2015  . Upper airway cough syndrome 02/14/2015  . Asthmatic bronchitis with acute exacerbation 02/06/2015  . Hypothyroidism 10/25/2014  . Fatigue 10/25/2014  . 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 Chronic venous insufficiency 10/24/2014  . Varicose veins of leg with complications 62/83/1517  . Degeneration of intervertebral disc of cervical region 06/29/2014  . Palpitations 11/08/2013  . SOB (shortness of breath) 10/03/2013  . DOE (dyspnea on exertion) 10/03/2013  . Edema of both legs 10/03/2013  . Hair loss 11/25/2012  . Diverticulitis 07/14/2012  . Hammer toe 06/13/2012  . Recurrent abdominal pain 04/17/2012  . Hypoglycemia 01/06/2012    Past Medical History:  Diagnosis Date  . Arthritis   . Asthmatic bronchitis with acute exacerbation 02/06/2015  . Bronchial pneumonia   . Colon polyp   . Diverticulitis   . DVT (deep venous thrombosis) ([facility])    lower extremity  . H/O cardiac catheterization 2004   Normal coronary arteries  . Heart murmur   . History of stress test 05/21/2011  . Hx of echocardiogram 01/27/2010   Normal Ef 55% the transmitral spectral doppler flow pattern is normal for age. the left ventricular wall motion is normal  . Migraines   . Pancreatitis   . Thyroid disease   . TIA (transient ischemic attack)    8 years ago    Past Surgical History:  Procedure Laterality Date  . BREAST BIOPSY     [facility]  . CARDIAC CATHETERIZATION     10/2013  . LEFT HEART CATHETERIZATION WITH CORONARY ANGIOGRAM N/A 10/25/2013   Procedure: LEFT HEART CATHETERIZATION WITH CORONARY ANGIOGRAM;  Surgeon: Troy Sine, MD;  Location: [facility] CATH LAB;  Service: Cardiovascular;  Laterality: N/A;  . NECK SURGERY     x's 2  . TOTAL KNEE ARTHROPLASTY     Bilateral x's 2  . VAGINAL HYSTERECTOMY   10/17/1998   [place]    Social History   Tobacco Use  . Smoking status: Former Smoker    Years: 2.00    Last attempt to quit: 04/06/1969    Years since quitting: 48.7  . Smokeless tobacco: Never Used  . Tobacco comment: socially smoked x 2 years  Substance Use Topics  . Alcohol use: No    Alcohol/week: 0.0 standard drinks  . Drug use: No    Family History  Problem Relation Age of Onset  . Arthritis Unknown   . Colon cancer Father   . Prostate cancer Father   . HIV Brother        74  . Kidney cancer Brother   . Colon cancer Brother   . Lung cancer Brother   . Other Brother        Mouth Cancer  . Ovarian cancer Mother   . Uterine cancer Mother   . Heart disease Maternal Grandmother   . Stroke Maternal Grandmother   . Hypertension Maternal Grandmother   . Diabetes Maternal Grandmother     Allergies  Allergen Reactions  . Prednisone Other (See Comments)    Changed personality   . Atrovent Hfa [Ipratropium  Bromide Hfa] Other (See Comments)    Pt could not sleep  . Cheratussin Ac [Guaifenesin-Codeine]   . Codeine Other (See Comments)  . Hydrocodone Itching  . Influenza Vaccines Other (See Comments)    Pt reports heart attack after last flu shot  . Motrin [Ibuprofen] Other (See Comments)    "Gives false reading in blood"    Medication list has been reviewed and updated.  Current Outpatient Medications on File Prior to Visit  Medication Sig Dispense Refill  . amoxicillin-clavulanate (AUGMENTIN) 875-125 MG tablet Take 1 tablet by mouth 2 (two) times daily. 10 tablet 0  . Ascorbic Acid (VITAMIN C PO) Vitamin C 500mg -Take 2-4 capsules by mouth daily.    . Biotin (RA BIOTIN) 1000 MCG tablet Take 1,000 mcg by mouth 2 (two) times daily.     Marland Kitchen BREWERS YEAST PO Take 2 tablets by mouth 2 (two) times daily.     . budesonide-formoterol (SYMBICORT) 80-4.5 MCG/ACT inhaler Inhale 2 puffs into the lungs 2 (two) times daily. 1 Inhaler 12  . clopidogrel (PLAVIX) 75 MG tablet  TAKE 1 TABLET BY MOUTH EVERY DAY 90 tablet 1  . dicyclomine (BENTYL) 20 MG tablet TAKE 1 TABLET BY MOUTH FOUR TIMES A DAY AS NEEDED 360 tablet 0  . FOLIC ACID PO Folic Acid 50mg -Take 1 tablet by mouth daily.    . furosemide (LASIX) 40 MG tablet TAKE 1 TABLET BY MOUTH TWICE A DAY 180 tablet 1  . KLOR-CON M20 20 MEQ tablet TAKE 1 TABLET BY MOUTH TWICE A DAY 180 tablet 2  . lactulose, encephalopathy, (CHRONULAC) 10 GM/15ML SOLN TAKE 30 MLS (20 G TOTAL) BY MOUTH EVERY 6 (SIX) HOURS AS NEEDED. 1892 mL 1  . levofloxacin (LEVAQUIN) 750 MG tablet Take 750 mg by mouth daily.    . Multiple Vitamin (MULTIVITAMIN) tablet Take 1 tablet by mouth daily.    Marland Kitchen OVER THE COUNTER MEDICATION Omega-3 Fatty acids-Vitamin E (fish oil) 1 capsule by mouth daily.    . promethazine-codeine (PHENERGAN WITH CODEINE) 6.25-10 MG/5ML syrup Take by mouth every 6 (six) hours as needed for cough.    . SYNTHROID 112 MCG tablet TAKE 1 TABLET BY MOUTH EVERY DAY BEFORE BREAKFAST 90 tablet 1  . vitamin D, CHOLECALCIFEROL, 400 UNITS tablet Take 400 Units by mouth 2 (two) times daily.      No current facility-administered medications on file prior to visit.     Review of Systems:  As per HPI- otherwise negative.   Physical Examination: Vitals:   01/06/18 1139  BP: 136/86  Pulse: 88  Resp: 18  Temp: 97.8 F (36.6 C)  SpO2: 97%   Vitals:   01/06/18 1139  Weight: 138 lb (62.6 kg)  Height: 5\' 4"  (1.626 m)   Body mass index is 23.69 kg/m. Ideal Body Weight: Weight in (lb) to have BMI = 25: 145.3  GEN: WDWN, NAD, Non-toxic, A & O x 3, normal weight, looks well except for persistent audible wheezing and some pursed lip breathing  HEENT: Atraumatic, Normocephalic. Neck supple. No masses, No LAD. Ears and Nose: No external deformity. CV: RRR, No M/G/R. No JVD. No thrill. No extra heart sounds. PULM: diffuse ronchi and wheezing in both lungs.  No retractions. No resp. distress. No accessory muscle use. EXTR: No c/c/e NEURO  Normal gait.  PSYCH: Normally interactive. Conversant. Not depressed or anxious appearing.  Calm demeanor.   Given albuterol neb- helped a bit but she is still wheezing  Assessment and Plan: Wheezing -  Plan: albuterol (PROVENTIL) (2.5 MG/3ML) 0.083% nebulizer solution 2.5 mg  Here today with persistent wheezing- possible dx of ILD per Dr Lake Bells.  She is on inhaled steroids.  Recent urgent care visit x2 with IM steroids, she is on levaquin and also agumentin. Still wheezing a lot.  Will send to ER for further eval now   Signed Lamar Blinks, MD

## 2018-01-07 DIAGNOSIS — J479 Bronchiectasis, uncomplicated: Secondary | ICD-10-CM | POA: Diagnosis not present

## 2018-01-07 DIAGNOSIS — Z823 Family history of stroke: Secondary | ICD-10-CM | POA: Diagnosis not present

## 2018-01-07 DIAGNOSIS — I5189 Other ill-defined heart diseases: Secondary | ICD-10-CM | POA: Diagnosis not present

## 2018-01-07 DIAGNOSIS — I252 Old myocardial infarction: Secondary | ICD-10-CM | POA: Diagnosis not present

## 2018-01-07 DIAGNOSIS — K5909 Other constipation: Secondary | ICD-10-CM | POA: Diagnosis present

## 2018-01-07 DIAGNOSIS — R0600 Dyspnea, unspecified: Secondary | ICD-10-CM | POA: Diagnosis present

## 2018-01-07 DIAGNOSIS — J849 Interstitial pulmonary disease, unspecified: Secondary | ICD-10-CM | POA: Diagnosis not present

## 2018-01-07 DIAGNOSIS — G459 Transient cerebral ischemic attack, unspecified: Secondary | ICD-10-CM | POA: Diagnosis not present

## 2018-01-07 DIAGNOSIS — E785 Hyperlipidemia, unspecified: Secondary | ICD-10-CM | POA: Diagnosis present

## 2018-01-07 DIAGNOSIS — J84112 Idiopathic pulmonary fibrosis: Secondary | ICD-10-CM | POA: Diagnosis present

## 2018-01-07 DIAGNOSIS — J45901 Unspecified asthma with (acute) exacerbation: Secondary | ICD-10-CM | POA: Diagnosis not present

## 2018-01-07 DIAGNOSIS — Z7902 Long term (current) use of antithrombotics/antiplatelets: Secondary | ICD-10-CM | POA: Diagnosis not present

## 2018-01-07 DIAGNOSIS — J440 Chronic obstructive pulmonary disease with acute lower respiratory infection: Secondary | ICD-10-CM

## 2018-01-07 DIAGNOSIS — E876 Hypokalemia: Secondary | ICD-10-CM | POA: Diagnosis not present

## 2018-01-07 DIAGNOSIS — Z87891 Personal history of nicotine dependence: Secondary | ICD-10-CM | POA: Diagnosis not present

## 2018-01-07 DIAGNOSIS — E039 Hypothyroidism, unspecified: Secondary | ICD-10-CM | POA: Diagnosis not present

## 2018-01-07 DIAGNOSIS — M1990 Unspecified osteoarthritis, unspecified site: Secondary | ICD-10-CM | POA: Diagnosis not present

## 2018-01-07 DIAGNOSIS — K219 Gastro-esophageal reflux disease without esophagitis: Secondary | ICD-10-CM | POA: Diagnosis present

## 2018-01-07 DIAGNOSIS — J209 Acute bronchitis, unspecified: Principal | ICD-10-CM

## 2018-01-07 DIAGNOSIS — Z90710 Acquired absence of both cervix and uterus: Secondary | ICD-10-CM | POA: Diagnosis not present

## 2018-01-07 DIAGNOSIS — R0902 Hypoxemia: Secondary | ICD-10-CM | POA: Diagnosis not present

## 2018-01-07 DIAGNOSIS — Z8719 Personal history of other diseases of the digestive system: Secondary | ICD-10-CM | POA: Diagnosis not present

## 2018-01-07 DIAGNOSIS — J9600 Acute respiratory failure, unspecified whether with hypoxia or hypercapnia: Secondary | ICD-10-CM | POA: Diagnosis not present

## 2018-01-07 DIAGNOSIS — Z833 Family history of diabetes mellitus: Secondary | ICD-10-CM | POA: Diagnosis not present

## 2018-01-07 DIAGNOSIS — Z8673 Personal history of transient ischemic attack (TIA), and cerebral infarction without residual deficits: Secondary | ICD-10-CM | POA: Diagnosis not present

## 2018-01-07 DIAGNOSIS — J470 Bronchiectasis with acute lower respiratory infection: Secondary | ICD-10-CM | POA: Diagnosis not present

## 2018-01-07 DIAGNOSIS — R0609 Other forms of dyspnea: Secondary | ICD-10-CM | POA: Diagnosis not present

## 2018-01-07 DIAGNOSIS — I110 Hypertensive heart disease with heart failure: Secondary | ICD-10-CM | POA: Diagnosis not present

## 2018-01-07 DIAGNOSIS — R011 Cardiac murmur, unspecified: Secondary | ICD-10-CM | POA: Diagnosis not present

## 2018-01-07 DIAGNOSIS — G43909 Migraine, unspecified, not intractable, without status migrainosus: Secondary | ICD-10-CM | POA: Diagnosis present

## 2018-01-07 DIAGNOSIS — R0602 Shortness of breath: Secondary | ICD-10-CM | POA: Diagnosis not present

## 2018-01-07 DIAGNOSIS — I5032 Chronic diastolic (congestive) heart failure: Secondary | ICD-10-CM | POA: Diagnosis not present

## 2018-01-07 LAB — BASIC METABOLIC PANEL
Anion gap: 11 (ref 5–15)
BUN: 6 mg/dL — AB (ref 8–23)
CALCIUM: 9.3 mg/dL (ref 8.9–10.3)
CO2: 25 mmol/L (ref 22–32)
Chloride: 100 mmol/L (ref 98–111)
Creatinine, Ser: 0.93 mg/dL (ref 0.44–1.00)
GFR calc Af Amer: >60 mL/min (ref >60)
GFR, EST NON AFRICAN AMERICAN: 60 mL/min — AB (ref >60)
Glucose, Bld: 237 mg/dL — ABNORMAL HIGH (ref 70–99)
Potassium: 4 mmol/L (ref 3.5–5.1)
SODIUM: 136 mmol/L (ref 135–145)

## 2018-01-07 LAB — RESPIRATORY PANEL BY PCR
ADENOVIRUS-RVPPCR: NOT DETECTED
Bordetella pertussis: NOT DETECTED
CHLAMYDOPHILA PNEUMONIAE-RVPPCR: NOT DETECTED
CORONAVIRUS NL63-RVPPCR: NOT DETECTED
Coronavirus 229E: NOT DETECTED
Coronavirus HKU1: NOT DETECTED
Coronavirus OC43: NOT DETECTED
INFLUENZA A-RVPPCR: NOT DETECTED
INFLUENZA B-RVPPCR: NOT DETECTED
Metapneumovirus: NOT DETECTED
Mycoplasma pneumoniae: NOT DETECTED
PARAINFLUENZA VIRUS 1-RVPPCR: NOT DETECTED
PARAINFLUENZA VIRUS 4-RVPPCR: NOT DETECTED
Parainfluenza Virus 2: NOT DETECTED
Parainfluenza Virus 3: NOT DETECTED
RESPIRATORY SYNCYTIAL VIRUS-RVPPCR: NOT DETECTED
Rhinovirus / Enterovirus: NOT DETECTED

## 2018-01-07 LAB — HIV ANTIBODY (ROUTINE TESTING W REFLEX): HIV SCREEN 4TH GENERATION: NONREACTIVE

## 2018-01-07 LAB — BRAIN NATRIURETIC PEPTIDE: B NATRIURETIC PEPTIDE 5: 270.1 pg/mL — AB (ref 0.0–100.0)

## 2018-01-07 LAB — MAGNESIUM: Magnesium: 2.9 mg/dL — ABNORMAL HIGH (ref 1.7–2.4)

## 2018-01-07 LAB — TROPONIN I
Troponin I: <0.03 ng/mL (ref <0.03)
Troponin I: <0.03 ng/mL (ref <0.03)

## 2018-01-07 MED ORDER — DOXYCYCLINE HYCLATE 100 MG PO TABS
100.0000 mg | ORAL_TABLET | Freq: Two times a day (BID) | ORAL | Status: DC
  Administered 2018-01-07 – 2018-01-09 (×5): 100 mg via ORAL
  Filled 2018-01-07 (×5): qty 1

## 2018-01-07 MED ORDER — LEVALBUTEROL HCL 1.25 MG/0.5ML IN NEBU
1.2500 mg | INHALATION_SOLUTION | Freq: Four times a day (QID) | RESPIRATORY_TRACT | Status: DC
  Administered 2018-01-07 – 2018-01-09 (×9): 1.25 mg via RESPIRATORY_TRACT
  Filled 2018-01-07 (×9): qty 0.5

## 2018-01-07 MED ORDER — METHYLPREDNISOLONE SODIUM SUCC 125 MG IJ SOLR
60.0000 mg | Freq: Three times a day (TID) | INTRAMUSCULAR | Status: DC
  Administered 2018-01-07 – 2018-01-09 (×8): 60 mg via INTRAVENOUS
  Filled 2018-01-07 (×8): qty 2

## 2018-01-07 MED ORDER — VITAMIN C 500 MG/5ML PO SYRP
500.0000 mg | ORAL_SOLUTION | Freq: Every day | ORAL | Status: DC
  Administered 2018-01-08 – 2018-01-10 (×3): 500 mg via ORAL
  Filled 2018-01-07 (×4): qty 5

## 2018-01-07 MED ORDER — MOMETASONE FURO-FORMOTEROL FUM 100-5 MCG/ACT IN AERO
2.0000 | INHALATION_SPRAY | Freq: Two times a day (BID) | RESPIRATORY_TRACT | Status: DC
  Administered 2018-01-07 – 2018-01-10 (×7): 2 via RESPIRATORY_TRACT
  Filled 2018-01-07: qty 8.8

## 2018-01-07 MED ORDER — ENOXAPARIN SODIUM 40 MG/0.4ML ~~LOC~~ SOLN
40.0000 mg | SUBCUTANEOUS | Status: DC
  Administered 2018-01-07 – 2018-01-09 (×3): 40 mg via SUBCUTANEOUS
  Filled 2018-01-07 (×4): qty 0.4

## 2018-01-07 MED ORDER — TIOTROPIUM BROMIDE MONOHYDRATE 18 MCG IN CAPS
18.0000 ug | ORAL_CAPSULE | Freq: Every day | RESPIRATORY_TRACT | Status: DC
  Administered 2018-01-07 – 2018-01-10 (×4): 18 ug via RESPIRATORY_TRACT
  Filled 2018-01-07: qty 5

## 2018-01-07 MED ORDER — PROMETHAZINE-CODEINE 6.25-10 MG/5ML PO SYRP: 5.0000 mL | ORAL_SOLUTION | Freq: Four times a day (QID) | ORAL | Status: DC | PRN

## 2018-01-07 MED ORDER — LEVOTHYROXINE SODIUM 112 MCG PO TABS
112.0000 ug | ORAL_TABLET | Freq: Every day | ORAL | Status: DC
  Administered 2018-01-07 – 2018-01-10 (×4): 112 ug via ORAL
  Filled 2018-01-07 (×4): qty 1

## 2018-01-07 MED ORDER — CHOLECALCIFEROL 10 MCG (400 UNIT) PO TABS
400.0000 [IU] | ORAL_TABLET | Freq: Two times a day (BID) | ORAL | Status: DC
  Administered 2018-01-08 – 2018-01-10 (×5): 400 [IU] via ORAL
  Filled 2018-01-07 (×8): qty 1

## 2018-01-07 MED ORDER — ADULT MULTIVITAMIN W/MINERALS CH
1.0000 | ORAL_TABLET | Freq: Every day | ORAL | Status: DC
  Administered 2018-01-07 – 2018-01-10 (×4): 1 via ORAL
  Filled 2018-01-07 (×4): qty 1

## 2018-01-07 MED ORDER — CLOPIDOGREL BISULFATE 75 MG PO TABS
75.0000 mg | ORAL_TABLET | Freq: Every day | ORAL | Status: DC
  Administered 2018-01-07 – 2018-01-10 (×4): 75 mg via ORAL
  Filled 2018-01-07 (×4): qty 1

## 2018-01-07 MED ORDER — BREWERS YEAST PO TABS: ORAL_TABLET | Freq: Two times a day (BID) | ORAL | Status: DC

## 2018-01-07 MED ORDER — LEVOFLOXACIN 750 MG PO TABS: 750.0000 mg | ORAL_TABLET | ORAL | Status: DC

## 2018-01-07 MED ORDER — PANTOPRAZOLE SODIUM 40 MG PO TBEC
40.0000 mg | DELAYED_RELEASE_TABLET | Freq: Two times a day (BID) | ORAL | Status: DC
  Administered 2018-01-07 – 2018-01-10 (×8): 40 mg via ORAL
  Filled 2018-01-07 (×8): qty 1

## 2018-01-07 MED ORDER — BIOTIN 1000 MCG PO TABS: 1000.0000 ug | ORAL_TABLET | Freq: Two times a day (BID) | ORAL | Status: DC

## 2018-01-07 MED ORDER — FUROSEMIDE 40 MG PO TABS
40.0000 mg | ORAL_TABLET | Freq: Two times a day (BID) | ORAL | Status: DC
  Administered 2018-01-07 – 2018-01-09 (×6): 40 mg via ORAL
  Filled 2018-01-07 (×6): qty 1

## 2018-01-07 MED ORDER — FOLIC ACID 1 MG PO TABS
1.0000 mg | ORAL_TABLET | Freq: Every day | ORAL | Status: DC
  Administered 2018-01-07 – 2018-01-10 (×4): 1 mg via ORAL
  Filled 2018-01-07 (×3): qty 1

## 2018-01-07 MED ORDER — GUAIFENESIN ER 600 MG PO TB12
1200.0000 mg | ORAL_TABLET | Freq: Two times a day (BID) | ORAL | Status: DC
  Administered 2018-01-07 – 2018-01-10 (×7): 1200 mg via ORAL
  Filled 2018-01-07 (×7): qty 2

## 2018-01-07 MED ORDER — MAGNESIUM SULFATE IN D5W 1-5 GM/100ML-% IV SOLN
1.0000 g | Freq: Once | INTRAVENOUS | Status: AC
  Administered 2018-01-07: 1 g via INTRAVENOUS
  Filled 2018-01-07: qty 100

## 2018-01-07 NOTE — Progress Notes (Signed)
While looking at the monitor RN noticed that the heart rhythm was having some ventricular bigeminy and trigeminy. Made MD aware and order are going to be placed accordingly.

## 2018-01-07 NOTE — Progress Notes (Signed)
PHARMACIST - PHYSICIAN ORDER COMMUNICATION  CONCERNING: P&T Medication Policy on Herbal Medications  DESCRIPTION:  This patient's order for:  Brewer's yeast and Biotin  has been noted.  This product(s) is classified as an "herbal" or natural product. Due to a lack of definitive safety studies or FDA approval, nonstandard manufacturing practices, plus the potential risk of unknown drug-drug interactions while on inpatient medications, the Pharmacy and Therapeutics Committee does not permit the use of "herbal" or natural products of this type within [facility].   ACTION TAKEN: The pharmacy department is unable to verify this order at this time and your patient has been informed of this safety policy. Please reevaluate patient's clinical condition at discharge and address if the herbal or natural product(s) should be resumed at that time.  Sherlon Handing, PharmD, BCPS Clinical pharmacist  **Pharmacist phone directory can now be found on [email or web address] (PW [email or web address]).  Listed under [facility].

## 2018-01-07 NOTE — Progress Notes (Signed)
Progress Note    Alexandria [name]  WRU:[id number] DOB: 12-17-1945  DOA: 01/06/2018 PCP: [name], DO    Brief Narrative:    Medical records reviewed and are as summarized below:  Alexandria [name] is an 72 y.o. female with medical history significant of TIA, hypothyroidism, DVT, who presents with cough, shortness of breath.  Patient states that she has been having cough and shortness of breath for almost 2 weeks, which has been progressively getting worse.  She denies chest pain, fever or chills. Patient was seen by Dr. Olin Pia for pulmonology on 9/23, concerned for bronchiectasis. He ordered lung function test and high-resolution CT scan. Pt was instructed to continue Symbicort inhaler. 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 She did not go back to see Dr. Olin Pia yet after CT scan was done. Pt states that she continues to have cough and shortness of breath, mostly dry cough. She was seen in UC on 12/27/17 and put on Augmentin for 5 days without improvement, and was seen in urgent care again on 10/1, and switched antibiotic to Levaquin. She states that her symptoms has been worsening.   Assessment/Plan:   Active Problems:   DOE (dyspnea on exertion)   Hypothyroidism   Diastolic dysfunction   Hypokalemia   TIA (transient ischemic attack)   Interstitial lung disease (HCC)  Dyspnea and cough: possibly due to ILD  -Patient failed outpatient treatment with Augmentin and 2 days of Levaquin plus inhaler.  Patient does not have fever or leukocytosis, clinically no pneumonia.  CT angiogram is negative for PE but showed mild interstitial lung disease. -Nebulizers: scheduled Spiriva inhaler and prn Xopenex Nebs -steroids -doxycycline for 5 days -cough syrup for cough  - respiratory virus panel negative -started on protonix 40 mg bid  -will get pulmonary  consult for advice  Hypothyroidism: -continue synthroid  Diastolic dysfunction: 2D echo on 06/15/2017 showed EF of 55-60% with grade 1 diastolic dysfunction.   No pulmonary edema on CT scan of chest.  -Continue home dose Lasix. -BNP elevated but nothing to compare -had cath 2015  Hypokalemia: K= 2.9 on admission. - Repleted -Mg ok  Hx of TIA (transient ischemic attack): - continue plavix   Family Communication/Anticipated D/C date and plan/Code Status   DVT prophylaxis: Lovenox ordered. Code Status: Full Code.  Family Communication:  Disposition Plan:    Medical Consultants:   pulm    Subjective:   Still with cough SOB ok as long as she is not moving  Objective:    Vitals:   01/06/18 2145 01/07/18 0500 01/07/18 0910 01/07/18 1030  BP: 140/66 (!) 123/57  120/61  Pulse: 76 78  84  Resp: 20 18  18   Temp: 97.9 F (36.6 C)   98 F (36.7 C)  TempSrc: Oral   Oral  SpO2: 95% 95% 96% 95%  Weight: 61.2 kg     Height: 5\' 4"  (1.626 m)       Intake/Output Summary (Last 24 hours) at 01/07/2018 1158 Last data filed at 01/07/2018 0954 Gross per 24 hour  Intake 865.33 ml  Output -  Net 865.33 ml   Filed Weights   01/06/18 1250 01/06/18 2145  Weight: 62.5 kg 61.2 kg    Exam: In bed, NAD No increased work of breathing, no wheezing, coarse breath sounds in upper lung fields rrr +Bs, soft Alert and oriented  Data Reviewed:  I have personally reviewed following labs and imaging studies:  Labs: Labs show the following:   Basic Metabolic Panel: Recent Labs  Lab 01/06/18 1324 01/07/18 0237 01/07/18 0824  NA 141  --  136  K 2.9*  --  4.0  CL 98  --  100  CO2 30  --  25  GLUCOSE 103*  --  237*  BUN 16  --  6*  CREATININE 0.96  --  0.93  CALCIUM 9.3  --  9.3  MG 2.2 2.9*  --    GFR Estimated Creatinine Clearance: 46.5 mL/min (by C-G formula based on SCr of 0.93 mg/dL). Liver Function Tests: No results for input(s): AST, ALT, ALKPHOS, BILITOT,  PROT, ALBUMIN in the last 168 hours. No results for input(s): LIPASE, AMYLASE in the last 168 hours. No results for input(s): AMMONIA in the last 168 hours. Coagulation profile No results for input(s): INR, PROTIME in the last 168 hours.  CBC: Recent Labs  Lab 01/06/18 1324  WBC 7.1  NEUTROABS 2.9  HGB 14.0  HCT 40.1  MCV 94.1  PLT 238   Cardiac Enzymes: Recent Labs  Lab 01/07/18 0424 01/07/18 0824  TROPONINI <0.03 <0.03   BNP (last 3 results) No results for input(s): PROBNP in the last 8760 hours. CBG: No results for input(s): GLUCAP in the last 168 hours. D-Dimer: No results for input(s): DDIMER in the last 72 hours. Hgb A1c: No results for input(s): HGBA1C in the last 72 hours. Lipid Profile: No results for input(s): CHOL, HDL, LDLCALC, TRIG, CHOLHDL, LDLDIRECT in the last 72 hours. Thyroid function studies: No results for input(s): TSH, T4TOTAL, T3FREE, THYROIDAB in the last 72 hours.  Invalid input(s): FREET3 Anemia work up: No results for input(s): VITAMINB12, FOLATE, FERRITIN, TIBC, IRON, RETICCTPCT in the last 72 hours. Sepsis Labs: Recent Labs  Lab 01/06/18 1324  WBC 7.1    Microbiology Recent Results (from the past 240 hour(s))  Respiratory Panel by PCR     Status: None   Collection Time: 01/07/18  2:38 AM  Result Value Ref Range Status   Adenovirus NOT DETECTED NOT DETECTED Final   Coronavirus 229E NOT DETECTED NOT DETECTED Final   Coronavirus HKU1 NOT DETECTED NOT DETECTED Final   Coronavirus NL63 NOT DETECTED NOT DETECTED Final   Coronavirus OC43 NOT DETECTED NOT DETECTED Final   Metapneumovirus NOT DETECTED NOT DETECTED Final   Rhinovirus / Enterovirus NOT DETECTED NOT DETECTED Final   Influenza A NOT DETECTED NOT DETECTED Final   Influenza B NOT DETECTED NOT DETECTED Final   Parainfluenza Virus 1 NOT DETECTED NOT DETECTED Final   Parainfluenza Virus 2 NOT DETECTED NOT DETECTED Final   Parainfluenza Virus 3 NOT DETECTED NOT DETECTED Final     Parainfluenza Virus 4 NOT DETECTED NOT DETECTED Final   Respiratory Syncytial Virus NOT DETECTED NOT DETECTED Final   Bordetella pertussis NOT DETECTED NOT DETECTED Final   Chlamydophila pneumoniae NOT DETECTED NOT DETECTED Final   Mycoplasma pneumoniae NOT DETECTED NOT DETECTED Final    Comment: Performed at [facility], [place] [address], [place], [place] [address]    Procedures and diagnostic studies:  Ct Angio Chest Pe W And/or Wo Contrast  Result Date: 01/06/2018 CLINICAL DATA:  Cough and shortness of breath for 11 days. EXAM: CT ANGIOGRAPHY CHEST WITH CONTRAST TECHNIQUE: Multidetector CT imaging of the chest was performed using the standard protocol during bolus administration of intravenous contrast. Multiplanar CT image reconstructions and MIPs were obtained to evaluate the  vascular anatomy. CONTRAST:  159mL ISOVUE-370 IOPAMIDOL (ISOVUE-370) INJECTION 76% COMPARISON:  CT chest 12/30/2017. FINDINGS: Cardiovascular: Satisfactory opacification of the pulmonary arteries to the segmental level. No evidence of pulmonary embolism. Normal heart size. No pericardial effusion. Aortic atherosclerosis. Aortic valve calcifications. Coronary artery calcification. Ectasia of ascending thoracic aorta. Mediastinum/Nodes: No enlarged mediastinal, hilar, or axillary lymph nodes. Thyroid gland, trachea, and esophagus demonstrate no significant findings. Lungs/Pleura: Multiple small calcified and noncalcified pulmonary nodules are noted, unchanged from recent CT. No acute consolidative airspace disease. Peripheral predominant areas of septal thickening and bronchiectasis. Upper Abdomen: Unremarkable. Musculoskeletal: No chest wall abnormality. No acute or significant osseous findings. Review of the MIP images confirms the above findings. IMPRESSION: No evidence of PE. No acute consolidative airspace disease. Stable mild interstitial lung disease. 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  Recommend annual imaging followup by CTA or MRA. This recommendation follows 2010 ACCF/AHA/AATS/ACR/ASA/SCA/SCAI/SIR/STS/SVM Guidelines for the Diagnosis and Management of Patients with Thoracic Aortic Disease. Circulation. 2010; 121: U633-H545. Aortic Atherosclerosis (ICD10-I70.0). Electronically Signed   By: Staci Righter M.D.   On: 01/06/2018 14:31    Medications:   . ascorbic acid  500 mg Oral Daily  . cholecalciferol  400 Units Oral BID  . clopidogrel  75 mg Oral Daily  . doxycycline  100 mg Oral Q12H  . enoxaparin (LOVENOX) injection  40 mg Subcutaneous Q24H  . folic acid  1 mg Oral Daily  . furosemide  40 mg Oral BID  . levalbuterol  1.25 mg Nebulization Q6H  . levothyroxine  112 mcg Oral QAC breakfast  . methylPREDNISolone (SOLU-MEDROL) injection  60 mg Intravenous Q8H  . mometasone-formoterol  2 puff Inhalation BID  . multivitamin with minerals  1 tablet Oral Daily  . pantoprazole  40 mg Oral BID  . tiotropium  18 mcg Inhalation Daily   Continuous Infusions: . sodium chloride Stopped (01/06/18 1625)     LOS: 0 days   Geradine Girt  [facility]   *Please refer to [email or web address], password [email or web address] to get updated schedule on who will round on this patient, as hospitalists switch teams weekly. If 7PM-7AM, please contact night-coverage at [email or web address], password [email or web address] for any overnight needs.  01/07/2018, 11:58 AM

## 2018-01-07 NOTE — Consult Note (Addendum)
PULMONARY / CRITICAL CARE MEDICINE   NAME:  Alexandria [name], MRN:  [id number], DOB:  06-Dec-1945, LOS: 0 ADMISSION DATE:  01/06/2018, CONSULTATION DATE:  01/07/2018 REFERRING MD: Eliseo Squires, CHIEF COMPLAINT:  ILD/ Cough/  Dyspnea  BRIEF HISTORY:    Alexandria [name] is an 72 y.o. female remote former smoker ( Quit 1971, only smoked x 2 years) with medical history significant ofTIA, hypothyroidism, DVT, who presents with cough, shortness of breath. Significant Pulmonary History: Remote smoker GERD UAC syndrome Asthma Frequent bronchitis/ history of bronchial pneumonia Elevated FENO   HISTORY OF PRESENT ILLNESS   Patient states that she has been having cough and shortness of breath for almost 2 weeks, which has been progressively getting worse.She denies chest pain, fever or chills. Patient was seen by Dr. Olin Pia for pulmonology on 9/23, concerned for bronchiectasis.He orderedlung function test and high-resolution CT scan. Pt was instructed to continue Symbicort inhaler. 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 She was seen in UC on 9/23/19and put on Augmentin for 5 days without improvement, and wasseen in urgent care again on 10/1, and switchedantibiotic to Levaquin. She states that her symptoms has been worsening. She was admitted to [facility] 01/06/2018 for worsening cough and Dyspnea by the Hospitalist Service.They have consulted [place]  [facility] regarding ? ILD/ Dyspnea and cough.  SIGNIFICANT PAST MEDICAL HISTORY   As noted above  SIGNIFICANT EVENTS:  01/06/2018>> Admission for non-productive cough, dyspnea , failed OP treatment STUDIES:   01/06/2018 CTA IMPRESSION: No evidence of PE. No  acute consolidative airspace disease. Stable mild interstitial lung disease.Peripheral predominant areas of septal thickening and bronchiectasis. 12/30/2017 HRCT There are findings in the lungs compatible with interstitial lung disease, and based on the spectrum of findings this is classified as ATS probable usual interstitial pneumonia (UIP). This is not yet definitive, particularly in light of the absence of honeycombing, lack of traction bronchiectasis and presence of mild air trapping, with primary differential consideration that of fibrotic phase nonspecific interstitial pneumonia (NSIP). Repeat high-resolution chest CT is recommended in 12 months to assess for temporal changes in the appearance of the lung parenchyma PFT's 12/30/2017 F/F Ratio 81%/ 72% predicted FVC 2.10 or 72% predicted FEV1 1.70 or 78% predicted TLC 3.95 of 78% predicted DLCO 16.06 or 66 % predicted  - UGI 09/22/16 Pos mild/mod GERD  - FENO 11/30/2016 = 40 during flare off all rx - Allergy profile 11/30/2016 >Eos 0.3 / IgE Pending  06/2017 Echo Normal LV systolic function; mild diastolic dysfunction; elevated   LV filling pressure; mild AI; mild MR.   CULTURES:  10/4 RVP>> Negative  ANTIBIOTICS:  9/23-8/27>> Augmentin as OP 10/1 - 10/3>>Levaquin as OP Hospital Doxycycline: 10/3 >>    LINES/TUBES:   PIV  CONSULTANTS:  10/4 Pulmonary  SUBJECTIVE:  States she has had cough for too long to remember. She states her cough is non-productive. She states she does not a wheeze with her cough. She has dyspnea only with exertion. She is upset this is affecting her ability to sing in the choir.   CONSTITUTIONAL: BP 120/61 (BP Location: Right Arm)   Pulse 84   Temp 98 F (36.7 C) (Oral)   Resp 18   Ht _0  (1.626 m)   Wt  61.2 kg   SpO2 95%   BMI 23.17 kg/m   I/O last 3 completed shifts: In: 745.3 [P.O.:240; I.V.:148.9; IV Piggyback:356.5] Out: -     PHYSICAL EXAM: General:  Awake, alert  and oriented, NAD on RA, coughing  Neuro:  Awake and alert, MAE x 4, A&O x 3, appropriate, following commands HEENT:  NCAT, No LAD, thyroid without masses Cardiovascular:  S1, S2, RRR, No RMG, few PVC's per Tele Lungs:Bilateral chest excursion, Clear with few wheezes with cough, crackles per bases, upper airway wheeze with cough  Abdomen:  Soft, NT, ND, flat , BS + Musculoskeletal:  No obvious deformities Skin:  Warm dry and intact, no rash lesions notes  RESOLVED PROBLEM LIST   ASSESSMENT AND PLAN   Acute bronchitis / cough /dyspnea Failed OP treatment with Augmentin and Levaquin CTA negative for PE, + for Peripheral predominant areas of septal thickening and bronchiectasis. HRCT + for ILD Afebrile, WBC WNL RVP negative Auto immune work up essentially negative Definite upper airway component to cough Plan Continue Solumedrol 60 mg Q 8 Transition to pred taper for discharge Continue nebulizers: scheduledSpiriva inhalerand prn Xopenex Nebs Continue doxycycline for 5 days Continue cough suppression with syrupfor cough  Continue protonix 40 mg bid  Sips of water instead of throat clearing Sugar free hard candies for throat soothing  Follow up with Pulmonary within 1 week of discharge Follow up with Dr. Lake Bells 10/15 as scheduled  ILD Per HRCT>> not biopsy or BAL diagnosed May be etiology of chronic cough Hx GERD Explained this is a progressive disease Plan: Steroids as above for flare Follow up with Dr. Lake Bells as OP Consider ILD support group Consider anti fibrotics at OP follow up Will need ambulatory oxygenation test at discharge as dyspnea is with exertion only   Bronchiectasis Per CTA Plan: Add mucinex 1200 mg once daily with full glass of water as mucolytic Flutter valve ( Please educate patient on use) OOB to chair Will need sputum culture as OP once off antibiotics for 6-8 weeks Consider chest vest  Dyspnea Most likely multifactorial ILD, Asthma,  Bronchitis, bronchiectasis Per patient with exertion only Plan: Ambulatory oxygenation at DC Consider Pulmonary rehab at discharge Oxygen as needed for sats> 92% Will need 6 minute walk as OP  Asthma FENO 40 mmHg in the office 12/2017 Pt. States she does not know her triggers + upper airway wheeze on exam IgE slightly elevated 11/2016>> 129 Plan: Continue Steroids and pred taper at discharge Continue scheduled Xopenex  Continue Dulera and Spiriva  Consider Follow up with allergy  Note eosinophils ( 6.3 on 11/30/2017)  All other care per [facility]>> Primary Team Follow up appointment with Dr.McQuaid 01/17/2018 at 3: 31 [place] [facility]    SUMMARY OF TODAY'S PLAN:  Continue current care with steroids and nebs  Add Mucinex and flutter valve for bronchiectasis Will need hospital follow up within 1 week of discharge at [facility] Follow up with Dr. Lake Bells at [facility] 10/14/as is scheduled  Best Practice / Goals of Care / Disposition.   DVT PROPHYLAXIS:Lovenox YQI:[id number] NUTRITION:Regular diet MOBILITY:OOB as tolerated GOALS OF CARE:Discharge home FAMILY DISCUSSIONS: Discussed in full with patient DISPOSITION Per [facility]/ Primary team  LABS  Glucose No results for input(s): GLUCAP in the last 168 hours.  BMET Recent Labs  Lab 01/06/18 1324 01/07/18 0824  NA 141 136  K 2.9* 4.0  CL 98 100  CO2 30 25  BUN 16 6*  CREATININE 0.96 0.93  GLUCOSE  103* 237*    Liver Enzymes No results for input(s): AST, ALT, ALKPHOS, BILITOT, ALBUMIN in the last 168 hours.  Electrolytes Recent Labs  Lab 01/06/18 1324 01/07/18 0237 01/07/18 0824  CALCIUM 9.3  --  9.3  MG 2.2 2.9*  --     CBC Recent Labs  Lab 01/06/18 1324  WBC 7.1  HGB 14.0  HCT 40.1  PLT 238    ABG No results for input(s): PHART, PCO2ART, PO2ART in the last 168 hours.  Coag's No results for input(s): APTT, INR in the last 168 hours.  Sepsis Markers No results for input(s):  LATICACIDVEN, PROCALCITON, O2SATVEN in the last 168 hours.  Cardiac Enzymes Recent Labs  Lab 01/07/18 0424 01/07/18 0824  TROPONINI <0.03 <0.03    PAST MEDICAL HISTORY :   She  has a past medical history of Arthritis, Asthmatic bronchitis with acute exacerbation (02/06/2015), Bronchial pneumonia, Colon polyp, Diverticulitis, DVT (deep venous thrombosis) ([place]), H/O cardiac catheterization (2004), Heart murmur, History of stress test (05/21/2011), echocardiogram (01/27/2010), Migraines, Pancreatitis, Thyroid disease, and TIA (transient ischemic attack).  PAST SURGICAL HISTORY:  She  has a past surgical history that includes Vaginal hysterectomy (10/17/1998); Breast biopsy; Total knee arthroplasty; Neck surgery; Cardiac catheterization; and left heart catheterization with coronary angiogram (N/A, 10/25/2013).  Allergies  Allergen Reactions  . Prednisone Other (See Comments)    Changed personality   . Atrovent Hfa [Ipratropium Bromide Hfa] Other (See Comments)    Pt could not sleep  . Cheratussin Ac [Guaifenesin-Codeine]   . Codeine Other (See Comments)    Pt takes promethazine with codeine at home  . Hydrocodone Itching  . Influenza Vaccines Other (See Comments)    Pt reports heart attack after last flu shot  . Motrin [Ibuprofen] Other (See Comments)    "Gives false reading in blood"    No current facility-administered medications on file prior to encounter.    Current Outpatient Medications on File Prior to Encounter  Medication Sig  . albuterol (PROVENTIL HFA;VENTOLIN HFA) 108 (90 Base) MCG/ACT inhaler Inhale 2 puffs into the lungs every 4 (four) hours as needed for wheezing.  Marland Kitchen amoxicillin-clavulanate (AUGMENTIN) 875-125 MG tablet Take 1 tablet by mouth 2 (two) times daily.  . benzonatate (TESSALON) 100 MG capsule Take 100 mg by mouth 3 (three) times daily as needed for cough.  . Biotin (RA BIOTIN) 1000 MCG tablet Take 1,000 mcg by mouth 2 (two) times daily.   Marland Kitchen BREWERS YEAST PO  Take 2 tablets by mouth 2 (two) times daily.   . budesonide-formoterol (SYMBICORT) 80-4.5 MCG/ACT inhaler Inhale 2 puffs into the lungs 2 (two) times daily.  . clopidogrel (PLAVIX) 75 MG tablet TAKE 1 TABLET BY MOUTH EVERY DAY (Patient taking differently: Take 75 mg by mouth daily. )  . dicyclomine (BENTYL) 20 MG tablet TAKE 1 TABLET BY MOUTH FOUR TIMES A DAY AS NEEDED (Patient taking differently: Take 20 mg by mouth 4 (four) times daily as needed for spasms. )  . FOLIC ACID PO Take 50 mg by mouth daily.   . furosemide (LASIX) 40 MG tablet TAKE 1 TABLET BY MOUTH TWICE A DAY (Patient taking differently: Take 40 mg by mouth 2 (two) times daily. )  . KLOR-CON M20 20 MEQ tablet TAKE 1 TABLET BY MOUTH TWICE A DAY (Patient taking differently: Take 20 mEq by mouth 2 (two) times daily. )  . lactulose, encephalopathy, (CHRONULAC) 10 GM/15ML SOLN TAKE 30 MLS (20 G TOTAL) BY MOUTH EVERY 6 (SIX) HOURS AS NEEDED. (  Patient taking differently: Take 20 g by mouth every 6 (six) hours as needed (constipation). )  . levofloxacin (LEVAQUIN) 750 MG tablet Take 750 mg by mouth daily.  . Multiple Vitamin (MULTIVITAMIN) tablet Take 1 tablet by mouth daily.  . OMEGA-3 FATTY ACIDS-VITAMIN E PO Take 1 capsule by mouth daily.  . promethazine-codeine (PHENERGAN WITH CODEINE) 6.25-10 MG/5ML syrup Take 5 mLs by mouth every 4 (four) hours as needed for cough.   . SYNTHROID 112 MCG tablet TAKE 1 TABLET BY MOUTH EVERY DAY BEFORE BREAKFAST (Patient taking differently: Take 112 mcg by mouth daily before breakfast. )  . vitamin C (ASCORBIC ACID) 500 MG tablet Take 1,000-2,000 mg by mouth daily.  . vitamin D, CHOLECALCIFEROL, 400 UNITS tablet Take 400 Units by mouth 2 (two) times daily.     FAMILY HISTORY:   Her family history includes Arthritis in her unknown relative; Colon cancer in her brother and father; Diabetes in her maternal grandmother; HIV in her brother; Heart disease in her maternal grandmother; Hypertension in her  maternal grandmother; Kidney cancer in her brother; Lung cancer in her brother; Other in her brother; Ovarian cancer in her mother; Prostate cancer in her father; Stroke in her maternal grandmother; Uterine cancer in her mother.  SOCIAL HISTORY:  She  reports that she quit smoking about 48 years ago. She quit after 2.00 years of use. She has never used smokeless tobacco. She reports that she does not drink alcohol or use drugs.  REVIEW OF SYSTEMS:    Gen: Denies fever, chills, weight change,+  fatigue, no night sweats HEENT: Denies blurred vision, double vision, hearing loss, tinnitus, sinus congestion, rhinorrhea, sore throat, neck stiffness, dysphagia PULM: + shortness of breath with exertion only, + non-productive cough,No  sputum production, hemoptysis, + wheezing with cough CV: Denies chest pain, edema, orthopnea, paroxysmal nocturnal dyspnea, palpitations GI: Denies abdominal pain, nausea, vomiting, diarrhea, hematochezia, melena, constipation, change in bowel habits GU: Denies dysuria, hematuria, polyuria, oliguria, urethral discharge Endocrine: Denies hot or cold intolerance, polyuria, polyphagia or appetite change Derm: Denies rash, dry skin, scaling or peeling skin change Heme: Denies easy bruising, bleeding, bleeding gums Neuro: Denies headache, numbness, weakness, slurred speech, loss of memory or consciousness  Magdalen Spatz, AGACNP-BC [facility] Pager # [phone] 01/07/2018 1:39 PM  Attending Note:  72 year old female with history of bronchiectasis who presents with SOB.  On exam, coarse BS diffusely.  I reviewed chest CT myself, evidence of bronchiectasis noted with some emphysematous changes.  Discussed with PCCM-NP.  Bronchiectasis:  - May consider changing doxy to levaquin to cover for pseudomonas  - Airway clearance techniques as above  ILD:  - Solumedrol  Bronchiectasis:  - Levaquin as above  Asthma:  - Xopenex  Will need  f/u as outpatient, arrangement made.   PCCM will sign off, please call back if needed.  Patient seen and examined, agree with above note.  I dictated the care and orders written for this patient under my direction.  Rush Farmer, [place]

## 2018-01-08 MED ORDER — SODIUM CHLORIDE 3 % IN NEBU
4.0000 mL | INHALATION_SOLUTION | Freq: Three times a day (TID) | RESPIRATORY_TRACT | Status: DC
  Administered 2018-01-08 – 2018-01-10 (×6): 4 mL via RESPIRATORY_TRACT
  Filled 2018-01-08 (×9): qty 4

## 2018-01-08 NOTE — Progress Notes (Addendum)
PROGRESS NOTE  Alexandria [name] UDJ:[id number] DOB: [birth year] DOA: 01/06/2018 PCP: [name], DO  HPI/Recap of past 24 hours: Alexandria [name] is an 72 y.o. female with medical history significant ofTIA, hypothyroidism, DVT, who presents with cough, shortness of breath.  Patient states that she has been having cough and shortness of breath for almost 2 weeks, which has been progressively getting worse.She denies chest pain, fever or chills. Patient was seen by Dr. Olin Pia for pulmonology on 9/23, concerned for bronchiectasis.He orderedlung function test and high-resolution CT scan. Pt was instructed to continue Symbicort inhaler. 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 Olin Pia yet after CT scan was done.Pt states that she continuesto have cough and shortness of breath, mostly dry cough. She was seen in UC on 9/23/19and put on Augmentin for 5 days without improvement, and wasseen in urgent care again on 10/1, and switchedantibiotic to Levaquin. She states that her symptoms has been worsening.   01/08/18: Seen and examined at bedside. Reports persistent dry cough and exertional dyspnea. 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 Denies chest pain or palpitations.   Assessment/Plan: Active Problems:   DOE (dyspnea on exertion)   Hypothyroidism   Diastolic dysfunction   Hypokalemia   TIA (transient ischemic attack)   Interstitial lung disease (HCC)   Dyspnea  Intractable cough complicated by dyspnea with minimal exertion Independtly reviewed CT chest with patient revealed interstitial lung disease. Follows with Pulmonology C/w current pulm meds  CT angiogram is negative for PE  -C/w Nebulizers: scheduledSpiriva inhalerand prn Xopenex Nebs -C/w IV solumedrol 60 mg Tid -doxycycline for 5  days -cough syrupfor cough -started pulmonary toilet with mucinex 1200 mg bid and hypersaline nebs  - respiratory virus panel negative -C/w protonix 40 mg bid  -Pulm following  Hypothyroidism: -continue synthroid  Diastolic HYIFOYDXAJO:8N echo on 06/15/2017 showed EF of 55-60% with grade 1 diastolic dysfunction. No pulmonary edema on CT scan of chest.  -C/w home dose Lasix. -BNP elevated but nothing to compare -had cath 2015  Resolved hypokalemia post repletion  Hx ofTIA (transient ischemic attack): - continue plavix    Code Status: full  Family Communication: none at bedside   Disposition Plan: home possibly in 1-2 days when clinically stable or when pulm signs off.   Consultants:  pulm  Procedures:  none  Antimicrobials:  doxycycline  DVT prophylaxis:  sq lovenox daily  Objective: Vitals:   01/08/18 0424 01/08/18 1352 01/08/18 1352 01/08/18 1354  BP: 136/71  111/70   Pulse: 71 91 85   Resp: (!) 21 (!) 21    Temp: 97.9 F (36.6 C)  98.1 F (36.7 C)   TempSrc: Oral  Oral   SpO2: 94%  94% 94%  Weight: 62.5 kg     Height:        Intake/Output Summary (Last 24 hours) at 01/08/2018 1439 Last data filed at 01/08/2018 1000 Gross per 24 hour  Intake 240 ml  Output 3950 ml  Net -3710 ml   Filed Weights   01/06/18 2145 01/07/18 1652 01/08/18 0424  Weight: 61.2 kg 62.1 kg 62.5 kg    Exam:  . General: 71 y.o. year-old female well developed well nourished in no acute distress.  Alert and oriented x3. . Cardiovascular: Regular rate and rhythm with no rubs or gallops.  No thyromegaly  or JVD noted.   Marland Kitchen Respiratory: Diffused rales and mild wheezes blaterally. Good inspiratory effort. . Abdomen: Soft nontender nondistended with normal bowel sounds x4 quadrants. . Musculoskeletal: No lower extremity edema. 2/4 pulses in all 4 extremities. . Skin: No ulcerative lesions noted or rashes . Psychiatry: Mood is appropriate for condition and  setting   Data Reviewed: CBC: Recent Labs  Lab 01/06/18 1324  WBC 7.1  NEUTROABS 2.9  HGB 14.0  HCT 40.1  MCV 94.1  PLT 782   Basic Metabolic Panel: Recent Labs  Lab 01/06/18 1324 01/07/18 0237 01/07/18 0824  NA 141  --  136  K 2.9*  --  4.0  CL 98  --  100  CO2 30  --  25  GLUCOSE 103*  --  237*  BUN 16  --  6*  CREATININE 0.96  --  0.93  CALCIUM 9.3  --  9.3  MG 2.2 2.9*  --    GFR: Estimated Creatinine Clearance: 46.5 mL/min (by C-G formula based on SCr of 0.93 mg/dL). Liver Function Tests: No results for input(s): AST, ALT, ALKPHOS, BILITOT, PROT, ALBUMIN in the last 168 hours. No results for input(s): LIPASE, AMYLASE in the last 168 hours. No results for input(s): AMMONIA in the last 168 hours. Coagulation Profile: No results for input(s): INR, PROTIME in the last 168 hours. Cardiac Enzymes: Recent Labs  Lab 01/07/18 0424 01/07/18 0824 01/07/18 1500  TROPONINI <0.03 <0.03 <0.03   BNP (last 3 results) No results for input(s): PROBNP in the last 8760 hours. HbA1C: No results for input(s): HGBA1C in the last 72 hours. CBG: No results for input(s): GLUCAP in the last 168 hours. Lipid Profile: No results for input(s): CHOL, HDL, LDLCALC, TRIG, CHOLHDL, LDLDIRECT in the last 72 hours. Thyroid Function Tests: No results for input(s): TSH, T4TOTAL, FREET4, T3FREE, THYROIDAB in the last 72 hours. Anemia Panel: No results for input(s): VITAMINB12, FOLATE, FERRITIN, TIBC, IRON, RETICCTPCT in the last 72 hours. Urine analysis:    Component Value Date/Time   COLORURINE YELLOW 04/09/2016 1326   APPEARANCEUR CLEAR 04/09/2016 1326   LABSPEC 1.004 (L) 04/09/2016 1326   PHURINE 7.5 04/09/2016 1326   GLUCOSEU NEGATIVE 04/09/2016 1326   HGBUR NEGATIVE 04/09/2016 1326   BILIRUBINUR neg 08/24/2017 1210   KETONESUR NEGATIVE 04/09/2016 1326   PROTEINUR Negative 08/24/2017 1210   PROTEINUR NEGATIVE 04/09/2016 1326   UROBILINOGEN 0.2 08/24/2017 1210   NITRITE neg  08/24/2017 1210   NITRITE NEGATIVE 04/09/2016 1326   LEUKOCYTESUR Negative 08/24/2017 1210   Sepsis Labs: @LABRCNTIP (procalcitonin:4,lacticidven:4)  ) Recent Results (from the past 240 hour(s))  Respiratory Panel by PCR     Status: None   Collection Time: 01/07/18  2:38 AM  Result Value Ref Range Status   Adenovirus NOT DETECTED NOT DETECTED Final   Coronavirus 229E NOT DETECTED NOT DETECTED Final   Coronavirus HKU1 NOT DETECTED NOT DETECTED Final   Coronavirus NL63 NOT DETECTED NOT DETECTED Final   Coronavirus OC43 NOT DETECTED NOT DETECTED Final   Metapneumovirus NOT DETECTED NOT DETECTED Final   Rhinovirus / Enterovirus NOT DETECTED NOT DETECTED Final   Influenza A NOT DETECTED NOT DETECTED Final   Influenza B NOT DETECTED NOT DETECTED Final   Parainfluenza Virus 1 NOT DETECTED NOT DETECTED Final   Parainfluenza Virus 2 NOT DETECTED NOT DETECTED Final   Parainfluenza Virus 3 NOT DETECTED NOT DETECTED Final   Parainfluenza Virus 4 NOT DETECTED NOT DETECTED Final   Respiratory Syncytial Virus NOT DETECTED NOT  DETECTED Final   Bordetella pertussis NOT DETECTED NOT DETECTED Final   Chlamydophila pneumoniae NOT DETECTED NOT DETECTED Final   Mycoplasma pneumoniae NOT DETECTED NOT DETECTED Final    Comment: Performed at [facility], [place] [address], [place], [place] [address]      Studies: No results found.  Scheduled Meds: . ascorbic acid  500 mg Oral Daily  . cholecalciferol  400 Units Oral BID  . clopidogrel  75 mg Oral Daily  . doxycycline  100 mg Oral Q12H  . enoxaparin (LOVENOX) injection  40 mg Subcutaneous Q24H  . folic acid  1 mg Oral Daily  . furosemide  40 mg Oral BID  . guaiFENesin  1,200 mg Oral BID  . levalbuterol  1.25 mg Nebulization Q6H  . levothyroxine  112 mcg Oral QAC breakfast  . methylPREDNISolone (SOLU-MEDROL) injection  60 mg Intravenous Q8H  . mometasone-formoterol  2 puff Inhalation BID  . multivitamin with minerals  1 tablet Oral Daily  .  pantoprazole  40 mg Oral BID  . sodium chloride HYPERTONIC  4 mL Nebulization TID  . tiotropium  18 mcg Inhalation Daily    Continuous Infusions: . sodium chloride Stopped (01/06/18 1625)     LOS: 1 day     Kayleen Memos, MD [facility] Pager [phone]  If 7PM-7AM, please contact night-coverage [email or web address] Password [email or web address] 01/08/2018, 2:39 PM

## 2018-01-09 DIAGNOSIS — R0902 Hypoxemia: Secondary | ICD-10-CM

## 2018-01-09 LAB — BASIC METABOLIC PANEL
ANION GAP: 9 (ref 5–15)
BUN: 16 mg/dL (ref 8–23)
CALCIUM: 8.5 mg/dL — AB (ref 8.9–10.3)
CO2: 27 mmol/L (ref 22–32)
Chloride: 100 mmol/L (ref 98–111)
Creatinine, Ser: 0.77 mg/dL (ref 0.44–1.00)
Glucose, Bld: 127 mg/dL — ABNORMAL HIGH (ref 70–99)
Potassium: 3.3 mmol/L — ABNORMAL LOW (ref 3.5–5.1)
Sodium: 136 mmol/L (ref 135–145)

## 2018-01-09 LAB — PROCALCITONIN

## 2018-01-09 MED ORDER — BISACODYL 10 MG RE SUPP: 10.0000 mg | Freq: Every day | RECTAL | Status: DC | PRN

## 2018-01-09 MED ORDER — METHYLPREDNISOLONE SODIUM SUCC 40 MG IJ SOLR: 40.0000 mg | Freq: Two times a day (BID) | INTRAMUSCULAR | Status: DC

## 2018-01-09 MED ORDER — LEVOFLOXACIN 750 MG PO TABS
750.0000 mg | ORAL_TABLET | Freq: Every day | ORAL | Status: DC
  Administered 2018-01-09 – 2018-01-10 (×2): 750 mg via ORAL
  Filled 2018-01-09 (×2): qty 1

## 2018-01-09 MED ORDER — POTASSIUM CHLORIDE CRYS ER 20 MEQ PO TBCR
40.0000 meq | EXTENDED_RELEASE_TABLET | Freq: Once | ORAL | Status: AC
  Administered 2018-01-09: 40 meq via ORAL
  Filled 2018-01-09: qty 2

## 2018-01-09 MED ORDER — SENNOSIDES-DOCUSATE SODIUM 8.6-50 MG PO TABS
2.0000 | ORAL_TABLET | Freq: Two times a day (BID) | ORAL | Status: DC
  Administered 2018-01-09 – 2018-01-10 (×3): 2 via ORAL
  Filled 2018-01-09 (×3): qty 2

## 2018-01-09 MED ORDER — MAGNESIUM SULFATE 2 GM/50ML IV SOLN
2.0000 g | Freq: Once | INTRAVENOUS | Status: AC
  Administered 2018-01-09: 2 g via INTRAVENOUS
  Filled 2018-01-09: qty 50

## 2018-01-09 MED ORDER — METHYLPREDNISOLONE SODIUM SUCC 125 MG IJ SOLR
60.0000 mg | Freq: Three times a day (TID) | INTRAMUSCULAR | Status: DC
  Administered 2018-01-09 – 2018-01-10 (×4): 60 mg via INTRAVENOUS
  Filled 2018-01-09 (×4): qty 2

## 2018-01-09 MED ORDER — IPRATROPIUM-ALBUTEROL 0.5-2.5 (3) MG/3ML IN SOLN: 3.0000 mL | Freq: Four times a day (QID) | RESPIRATORY_TRACT | Status: DC

## 2018-01-09 MED ORDER — POLYETHYLENE GLYCOL 3350 17 G PO PACK
17.0000 g | PACK | Freq: Every day | ORAL | Status: DC
  Administered 2018-01-09: 17 g via ORAL
  Filled 2018-01-09 (×2): qty 1

## 2018-01-09 MED ORDER — LEVALBUTEROL HCL 1.25 MG/0.5ML IN NEBU
1.2500 mg | INHALATION_SOLUTION | Freq: Three times a day (TID) | RESPIRATORY_TRACT | Status: DC
  Administered 2018-01-09 – 2018-01-10 (×4): 1.25 mg via RESPIRATORY_TRACT
  Filled 2018-01-09 (×4): qty 0.5

## 2018-01-09 MED ORDER — ATORVASTATIN CALCIUM 20 MG PO TABS
20.0000 mg | ORAL_TABLET | Freq: Every day | ORAL | Status: DC
  Administered 2018-01-09: 20 mg via ORAL
  Filled 2018-01-09: qty 1

## 2018-01-09 NOTE — Progress Notes (Addendum)
PROGRESS NOTE  Alexandria [name] MBW:[id number] DOB: 1945/04/21 DOA: 01/06/2018 PCP: [name], DO  HPI/Recap of past 24 hours: Alexandria [name] is an 72 y.o. female with medical history significant ofTIA, hypothyroidism, DVT, who presents with cough, shortness of breath.  Patient states that she has been having cough and shortness of breath for almost 2 weeks, which has been progressively getting worse.She denies chest pain, fever or chills. Patient was seen by Dr. Olin Pia for pulmonology on 9/23, concerned for bronchiectasis.He orderedlung function test and high-resolution CT scan. Pt was instructed to continue Symbicort inhaler. 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 Olin Pia yet after CT scan was done.Pt states that she continuesto have cough and shortness of breath, mostly dry cough. She was seen in UC on 9/23/19and put on Augmentin for 5 days without improvement, and wasseen in urgent care again on 10/1, and switchedantibiotic to Levaquin. She states that her symptoms has been worsening.   01/08/18: Seen and examined at bedside. Reports persistent dry cough and exertional dyspnea. 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 Denies chest pain or palpitations.  01/09/2018: Patient seen and examined at her bedside.  Reports cough is persistent but improving.  Yesterday started on pulmonary toilet.  Added chest physiotherapy today.  Pulmonology will see also today.  Highly appreciated.   Assessment/Plan: Active Problems:   DOE (dyspnea on exertion)   Hypothyroidism   Diastolic dysfunction   Hypokalemia   TIA (transient ischemic attack)   Interstitial lung disease (HCC)   Dyspnea  Intractable cough complicated by dyspnea with minimal exertion Independtly reviewed CT chest done on 01/06/2018  with patient revealed interstitial lung disease. Follows with Pulmonology C/w current pulm meds  On Xopenex for asthma CT angiogram is negative for PE  -C/w Nebulizers: scheduledSpiriva inhalerand prn Xopenex Nebs -C/w IV solumedrol 60 mg Tid -Start p.o. Levaquin 750 mg daily x5 days -Continue pulmonary toilet with mucinex 1200 mg bid and hypersaline nebs  - respiratory virus panel negative -C/w protonix 40 mg bid  -Pulmonary will see today  Mild hypokalemia Potassium 3.3 Repleted with p.o. potassium 40 mEq once In addition given 1 dose of 2 g of IV magnesium Repeat BMP in the morning  Chronic constipation Started on bowel regimen 2 tablets of Senokot twice daily, MiraLAX daily, Dulcolax suppository as needed  Asthma Management as stated above  Hypothyroidism: -continue synthroid  Chronic Diastolic SVX:7L echo on 3/90/3009 showed EF of 55-60% with grade 1 diastolic dysfunction. No pulmonary edema on CT scan of chest.  -C/w home dose Lasix. -BNP elevated but nothing to compare -had cath 2015  History of MI  Denies chest pain No acute issues Continue to monitor on telemetry Start low-dose statin  Hyperlipidemia LDL 117 on 11/26/2017 Start Lipitor 20 mg daily  Resolved hypokalemia post repletion  Hx ofTIA (transient ischemic attack): - continue plavix  History of bilateral knee replacement No acute issues PT to assess Out of bed to chair Fall precautions   Code Status: full  Family Communication: none at bedside   Disposition Plan: Home possibly tomorrow 01/10/2018 for when pulmonology signs off.   Consultants:  Pulmonology  Procedures:  none  Antimicrobials:  P.o. Levaquin  DVT prophylaxis:  sq lovenox daily  Objective: Vitals:   01/08/18 1352 01/08/18 1354 01/08/18 2016 01/09/18 0556  BP: 111/70  128/61 (!) 153/78  Pulse: 85  89 74  Resp:    (!) 30  Temp: 98.1 F (36.7 C)  98 F (36.7 C) 97.7 F (36.5 C)  TempSrc: Oral   Oral Oral  SpO2: 94% 94% 96% 95%  Weight:    62.9 kg  Height:        Intake/Output Summary (Last 24 hours) at 01/09/2018 0909 Last data filed at 01/09/2018 0900 Gross per 24 hour  Intake 940 ml  Output 5100 ml  Net -4160 ml   Filed Weights   01/07/18 1652 01/08/18 0424 01/09/18 0556  Weight: 62.1 kg 62.5 kg 62.9 kg    Exam:  . General: 72 y.o. year-old female very pleasant well-developed well-nourished in no acute distress.  Alert and oriented x3.   . Cardiovascular: Regular rate and rhythm with no rubs or gallops.  No thyromegaly or JVD.  Respiratory: Diffused rales and mild wheezes blaterally. Good inspiratory effort. . Abdomen: Soft nontender nondistended with normal bowel sounds x4 quadrants. . Musculoskeletal: No lower extremity edema. 2/4 pulses in all 4 extremities.  Clean scars noted on knees bilaterally from previous surgeries. . Skin: No ulcerative lesions noted or rashes . Psychiatry: Mood is appropriate for condition and setting   Data Reviewed: CBC: Recent Labs  Lab 01/06/18 1324  WBC 7.1  NEUTROABS 2.9  HGB 14.0  HCT 40.1  MCV 94.1  PLT 500   Basic Metabolic Panel: Recent Labs  Lab 01/06/18 1324 01/07/18 0237 01/07/18 0824 01/09/18 0629  NA 141  --  136 136  K 2.9*  --  4.0 3.3*  CL 98  --  100 100  CO2 30  --  25 27  GLUCOSE 103*  --  237* 127*  BUN 16  --  6* 16  CREATININE 0.96  --  0.93 0.77  CALCIUM 9.3  --  9.3 8.5*  MG 2.2 2.9*  --   --    GFR: Estimated Creatinine Clearance: 54.1 mL/min (by C-G formula based on SCr of 0.77 mg/dL). Liver Function Tests: No results for input(s): AST, ALT, ALKPHOS, BILITOT, PROT, ALBUMIN in the last 168 hours. No results for input(s): LIPASE, AMYLASE in the last 168 hours. No results for input(s): AMMONIA in the last 168 hours. Coagulation Profile: No results for input(s): INR, PROTIME in the last 168 hours. Cardiac Enzymes: Recent Labs  Lab 01/07/18 0424 01/07/18 0824 01/07/18 1500  TROPONINI  <0.03 <0.03 <0.03   BNP (last 3 results) No results for input(s): PROBNP in the last 8760 hours. HbA1C: No results for input(s): HGBA1C in the last 72 hours. CBG: No results for input(s): GLUCAP in the last 168 hours. Lipid Profile: No results for input(s): CHOL, HDL, LDLCALC, TRIG, CHOLHDL, LDLDIRECT in the last 72 hours. Thyroid Function Tests: No results for input(s): TSH, T4TOTAL, FREET4, T3FREE, THYROIDAB in the last 72 hours. Anemia Panel: No results for input(s): VITAMINB12, FOLATE, FERRITIN, TIBC, IRON, RETICCTPCT in the last 72 hours. Urine analysis:    Component Value Date/Time   COLORURINE YELLOW 04/09/2016 1326   APPEARANCEUR CLEAR 04/09/2016 1326   LABSPEC 1.004 (L) 04/09/2016 1326   PHURINE 7.5 04/09/2016 1326   GLUCOSEU NEGATIVE 04/09/2016 1326   HGBUR NEGATIVE 04/09/2016 1326   BILIRUBINUR neg 08/24/2017 1210   KETONESUR NEGATIVE 04/09/2016 1326   PROTEINUR Negative 08/24/2017 1210   PROTEINUR NEGATIVE 04/09/2016 1326   UROBILINOGEN 0.2 08/24/2017 1210   NITRITE neg 08/24/2017 1210   NITRITE NEGATIVE 04/09/2016 1326   LEUKOCYTESUR Negative  08/24/2017 1210   Sepsis Labs: @LABRCNTIP (procalcitonin:4,lacticidven:4)  ) Recent Results (from the past 240 hour(s))  Respiratory Panel by PCR     Status: None   Collection Time: 01/07/18  2:38 AM  Result Value Ref Range Status   Adenovirus NOT DETECTED NOT DETECTED Final   Coronavirus 229E NOT DETECTED NOT DETECTED Final   Coronavirus HKU1 NOT DETECTED NOT DETECTED Final   Coronavirus NL63 NOT DETECTED NOT DETECTED Final   Coronavirus OC43 NOT DETECTED NOT DETECTED Final   Metapneumovirus NOT DETECTED NOT DETECTED Final   Rhinovirus / Enterovirus NOT DETECTED NOT DETECTED Final   Influenza A NOT DETECTED NOT DETECTED Final   Influenza B NOT DETECTED NOT DETECTED Final   Parainfluenza Virus 1 NOT DETECTED NOT DETECTED Final   Parainfluenza Virus 2 NOT DETECTED NOT DETECTED Final   Parainfluenza Virus 3 NOT  DETECTED NOT DETECTED Final   Parainfluenza Virus 4 NOT DETECTED NOT DETECTED Final   Respiratory Syncytial Virus NOT DETECTED NOT DETECTED Final   Bordetella pertussis NOT DETECTED NOT DETECTED Final   Chlamydophila pneumoniae NOT DETECTED NOT DETECTED Final   Mycoplasma pneumoniae NOT DETECTED NOT DETECTED Final    Comment: Performed at [facility], [place] [address], [place],  [address]      Studies: No results found.  Scheduled Meds: . ascorbic acid  500 mg Oral Daily  . cholecalciferol  400 Units Oral BID  . clopidogrel  75 mg Oral Daily  . enoxaparin (LOVENOX) injection  40 mg Subcutaneous Q24H  . folic acid  1 mg Oral Daily  . furosemide  40 mg Oral BID  . guaiFENesin  1,200 mg Oral BID  . levalbuterol  1.25 mg Nebulization TID  . levofloxacin  750 mg Oral Daily  . levothyroxine  112 mcg Oral QAC breakfast  . methylPREDNISolone (SOLU-MEDROL) injection  60 mg Intravenous Q8H  . mometasone-formoterol  2 puff Inhalation BID  . multivitamin with minerals  1 tablet Oral Daily  . pantoprazole  40 mg Oral BID  . polyethylene glycol  17 g Oral Daily  . potassium chloride  40 mEq Oral Once  . senna-docusate  2 tablet Oral BID  . sodium chloride HYPERTONIC  4 mL Nebulization TID  . tiotropium  18 mcg Inhalation Daily    Continuous Infusions: . sodium chloride Stopped (01/06/18 1625)  . magnesium sulfate 1 - 4 g bolus IVPB       LOS: 2 days     Kayleen Memos, MD [facility] Pager [phone]  If 7PM-7AM, please contact night-coverage [email or web address] Password [email or web address] 01/09/2018, 9:09 AM

## 2018-01-09 NOTE — Progress Notes (Signed)
Physical Therapy Note:  SATURATION QUALIFICATIONS: (This note is used to comply with regulatory documentation for home oxygen)  Patient Saturations on Room Air at Rest = 97%  Patient Saturations on Room Air while Ambulating = 91-97%  No indication of need for supplemental O2 at this time.  Roney Marion, [place]  Acute Rehabilitation Services Pager [phone] Office [phone]

## 2018-01-09 NOTE — Consult Note (Signed)
PULMONARY / CRITICAL CARE MEDICINE   NAME:  Alexandria [name], MRN:  [id number], DOB:  04/26/1945, LOS: 2 ADMISSION DATE:  01/06/2018, CONSULTATION DATE:  01/07/2018 REFERRING MD: Eliseo Squires, CHIEF COMPLAINT:  ILD/ Cough/  Dyspnea  BRIEF HISTORY:    Alexandria [name] is an 72 y.o. female remote former smoker ( Quit 1971, only smoked x 2 years) with medical history significant ofTIA, hypothyroidism, DVT, who presents with cough, shortness of breath. Significant Pulmonary History: Remote smoker GERD UAC syndrome Asthma Frequent bronchitis/ history of bronchial pneumonia Elevated FENO   HISTORY OF PRESENT ILLNESS   Patient states that she has been having cough and shortness of breath for almost 2 weeks, which has been progressively getting worse.She denies chest pain, fever or chills. Patient was seen by Dr. Olin Pia for pulmonology on 9/23, concerned for bronchiectasis.He orderedlung function test and high-resolution CT scan. Pt was instructed to continue Symbicort inhaler. 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 She was seen in UC on 9/23/19and put on Augmentin for 5 days without improvement, and wasseen in urgent care again on 10/1, and switchedantibiotic to Levaquin. She states that her symptoms has been worsening. She was admitted to [facility] 01/06/2018 for worsening cough and Dyspnea by the Hospitalist Service.They have consulted [facility]  [facility] regarding ? ILD/ Dyspnea and cough.  SIGNIFICANT PAST MEDICAL HISTORY   As noted above  SIGNIFICANT EVENTS:  01/06/2018>> Admission for non-productive cough, dyspnea , failed OP treatment STUDIES:   01/06/2018 CTA IMPRESSION: No evidence of PE. No  acute consolidative airspace disease. Stable mild interstitial lung disease.Peripheral predominant areas of septal thickening and bronchiectasis. 12/30/2017 HRCT There are findings in the lungs compatible with interstitial lung disease, and based on the spectrum of findings this is classified as ATS probable usual interstitial pneumonia (UIP). This is not yet definitive, particularly in light of the absence of honeycombing, lack of traction bronchiectasis and presence of mild air trapping, with primary differential consideration that of fibrotic phase nonspecific interstitial pneumonia (NSIP). Repeat high-resolution chest CT is recommended in 12 months to assess for temporal changes in the appearance of the lung parenchyma PFT's 12/30/2017 F/F Ratio 81%/ 72% predicted FVC 2.10 or 72% predicted FEV1 1.70 or 78% predicted TLC 3.95 of 78% predicted DLCO 16.06 or 66 % predicted  - UGI 09/22/16 Pos mild/mod GERD  - FENO 11/30/2016 = 40 during flare off all rx - Allergy profile 11/30/2016 >Eos 0.3 / IgE Pending  06/2017 Echo Normal LV systolic function; mild diastolic dysfunction; elevated   LV filling pressure; mild AI; mild MR.   CULTURES:  10/4 RVP>> Negative  ANTIBIOTICS:  9/23-8/27>> Augmentin as OP 10/1 - 10/3>>Levaquin as [place] 10/4 >>   I reviewed chest CT myself, more impressive for fibrosis than bronchiectasis.  LINES/TUBES:   PIV  CONSULTANTS:  10/4 Pulmonary  SUBJECTIVE:  No events overnight, continues to cough when talking  CONSTITUTIONAL: BP 136/88 (BP Location: Left Arm)   Pulse (!) 36   Temp 98.9 F (37.2 C) (Oral)   Resp 20   Ht _0  (1.626 m)   Wt 62.9 kg   SpO2 96%   BMI 23.79 kg/m   I/O last 3 completed shifts: In: 940 [P.O.:940] Out: 6600 [Urine:6600]  PHYSICAL EXAM: General:  Chronically ill appearing female, NAD Neuro:  Awake and interactive, moving all ext to command HEENT:  [place]/AT, PERRL, EOM-I and  MMM Cardiovascular:  RRR, Nl S1/S2 and -M/R/G Lungs: Bibasilar crackles Abdomen:  Soft, NT, ND and +BS Musculoskeletal:  No obvious deformation Skin:  Warm dry and intact, no rash lesions notes  RESOLVED PROBLEM LIST   ASSESSMENT AND PLAN   Acute bronchitis / cough /dyspnea Failed OP treatment with Augmentin and Levaquin CTA negative for PE, + for Peripheral predominant areas of septal thickening and bronchiectasis. HRCT + for ILD Afebrile, WBC WNL RVP negative Auto immune work up essentially negative Definite upper airway component to cough Plan Continue Solumedrol 60q8 Transition to a prednisone taper Continue nebulizers: scheduledSpiriva inhalerand prn Xopenex Nebs Finish a 7 day course of levaquin Continue cough suppression with syrupfor cough  BID protonix Do not see need for nasal steroids Sips of water instead of throat clearing Sugar free hard candies for throat soothing  Follow up with Pulmonary within 1 week of discharge Follow up with Dr. [place] Bells 10/15 as scheduled  ILD Per HRCT>> not biopsy or BAL diagnosed May be etiology of chronic cough Hx GERD Explained this is a progressive disease Plan: Steroids as above for a flare Follow up with Dr. 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 States she does not  know her triggers + upper airway wheeze on exam IgE slightly elevated 11/2016>> 129 Plan: Continue Steroids and pred taper at discharge Continue scheduled Xopenex  Continue Dulera and Spiriva  Consider Follow up with allergy  Note eosinophils ( 6.3 on 11/30/2017)  SUMMARY OF TODAY'S PLAN:  Continue treatment as above.  F/U as arranged above.  Cough will not resolve in the short term.  It is likely chronic.  LABS  Glucose No results for input(s): GLUCAP in the last 168 hours.  BMET Recent Labs  Lab 01/06/18 1324 01/07/18 0824 01/09/18 0629  NA 141 136 136  K 2.9* 4.0 3.3*  CL 98 100 100  CO2 _0 BUN 16 6* 16  CREATININE 0.96 0.93 0.77  GLUCOSE 103* 237* 127*    Liver Enzymes No results for input(s): AST, ALT, ALKPHOS, BILITOT, ALBUMIN in the last 168 hours.  Electrolytes Recent Labs  Lab 01/06/18 1324 01/07/18 0237 01/07/18 0824 01/09/18 0629  CALCIUM 9.3  --  9.3 8.5*  MG 2.2 2.9*  --   --     CBC Recent Labs  Lab 01/06/18 1324  WBC 7.1  HGB 14.0  HCT 40.1  PLT 238    ABG No results for input(s): PHART, PCO2ART, PO2ART in the last 168 hours.  Coag's No results for input(s): APTT, INR in the last 168 hours.  Sepsis Markers Recent Labs  Lab 01/09/18 0629  PROCALCITON <0.10    Cardiac Enzymes Recent Labs  Lab 01/07/18 0424 01/07/18 0824 01/07/18 1500  TROPONINI <0.03 <0.03 <0.03   Discussed with PCCM-NP.  PCCM will sign off, please call back if needed.  Rush Farmer, M.D. [facility] Pulmonary/Critical Care Medicine. Pager: [phone]. After hours pager: [phone].

## 2018-01-09 NOTE — Evaluation (Signed)
Physical Therapy Evaluation Patient Details Name: Alexandria [name] MRN: [id number] DOB: [birth year] Today's Date: 01/09/2018   History of Present Illness  Alexandria [name] is a 72 y.o. female with medical history significant of TIA, hypothyroidism, DVT, who presents with cough, shortness of breath.  Clinical Impression   Patient evaluated by Physical Therapy with no further acute PT needs identified. All education has been completed and the patient has no further questions. No issues with uncomplicated amb; She politely declined going up and down stairs this afternoon; I do wonder if there is a component of exercise-induced asthma to this picture. See below for any follow-up Physical Therapy or equipment needs. PT is signing off. Thank you for this referral.     Follow Up Recommendations Other (comment)(Consider Outpt Pulmonary Rehab)    Equipment Recommendations  None recommended by PT    Recommendations for Other Services       Precautions / Restrictions Precautions Precautions: None Precaution Comments: Encouraged pt to self-monitor for activity tolerance      Mobility  Bed Mobility Overal bed mobility: Independent                Transfers Overall transfer level: Independent Equipment used: None                Ambulation/Gait Ambulation/Gait assistance: Modified independent (Device/Increase time) Gait Distance (Feet): 800 Feet Assistive device: None Gait Pattern/deviations: WFL(Within Functional Limits)     General Gait Details: Good self-monitor for activity tolerance  Stairs         General stair comments: Politely declined stairs today  Wheelchair Mobility    Modified Rankin (Stroke Patients Only)       Balance Overall balance assessment: Independent                                           Pertinent Vitals/Pain Pain Assessment: No/denies pain    Home Living Family/patient expects to be discharged to::  Private residence Living Arrangements: Spouse/significant other Available Help at Discharge: Family;Available PRN/intermittently Type of Home: House       Home Layout: Two level;Able to live on main level with bedroom/bathroom        Prior Function Level of Independence: Independent               Hand Dominance        Extremity/Trunk Assessment   Upper Extremity Assessment Upper Extremity Assessment: Overall WFL for tasks assessed    Lower Extremity Assessment Lower Extremity Assessment: Overall WFL for tasks assessed       Communication   Communication: No difficulties  Cognition Arousal/Alertness: Awake/alert Behavior During Therapy: WFL for tasks assessed/performed Overall Cognitive Status: Within Functional Limits for tasks assessed                                        General Comments General comments (skin integrity, edema, etc.): Walked to the [employer] and back O2 sats ranged 91%-98% throughout    Exercises     Assessment/Plan    PT Assessment Patent does not need any further PT services  PT Problem List         PT Treatment Interventions      PT Goals (Current goals can be found in the Care Plan section)  Acute  Rehab PT Goals Patient Stated Goal: Wants to sing with her a capella group PT Goal Formulation: All assessment and education complete, DC therapy    Frequency     Barriers to discharge        Co-evaluation               AM-PAC PT "6 Clicks" Daily Activity  Outcome Measure Difficulty turning over in bed (including adjusting bedclothes, sheets and blankets)?: None Difficulty moving from lying on back to sitting on the side of the bed? : None Difficulty sitting down on and standing up from a chair with arms (e.g., wheelchair, bedside commode, etc,.)?: None Help needed moving to and from a bed to chair (including a wheelchair)?: None Help needed walking in hospital room?: None Help needed climbing 3-5  steps with a railing? : None 6 Click Score: 24    End of Session   Activity Tolerance: Patient tolerated treatment well Patient left: in bed;with call bell/phone within reach Nurse Communication: Mobility status PT Visit Diagnosis: Difficulty in walking, not elsewhere classified (R26.2);Other (comment)(Decr functional capacity)    Time: 2330-0762 PT Time Calculation (min) (ACUTE ONLY): 20 min   Charges:   PT Evaluation $PT Eval Low Complexity: [place], PT  Acute Rehabilitation Services Pager [phone] Office [place] 01/09/2018, 3:45 PM

## 2018-01-09 NOTE — Plan of Care (Signed)
Pt educated on plan of care, current medications, flutter valve, and cough and deep breathing techniques.

## 2018-01-10 ENCOUNTER — Telehealth: Payer: Self-pay | Admitting: Pulmonary Disease

## 2018-01-10 ENCOUNTER — Telehealth: Payer: Self-pay

## 2018-01-10 DIAGNOSIS — R0600 Dyspnea, unspecified: Secondary | ICD-10-CM

## 2018-01-10 LAB — BASIC METABOLIC PANEL
Anion gap: 9 (ref 5–15)
BUN: 15 mg/dL (ref 8–23)
CALCIUM: 8.4 mg/dL — AB (ref 8.9–10.3)
CO2: 26 mmol/L (ref 22–32)
CREATININE: 0.78 mg/dL (ref 0.44–1.00)
Chloride: 100 mmol/L (ref 98–111)
GFR calc non Af Amer: >60 mL/min (ref >60)
Glucose, Bld: 125 mg/dL — ABNORMAL HIGH (ref 70–99)
Potassium: 3.4 mmol/L — ABNORMAL LOW (ref 3.5–5.1)
Sodium: 135 mmol/L (ref 135–145)

## 2018-01-10 MED ORDER — METHYLPREDNISOLONE 4 MG PO TBPK: 8.0000 mg | ORAL_TABLET | Freq: Every morning | ORAL | Status: DC

## 2018-01-10 MED ORDER — MOMETASONE FURO-FORMOTEROL FUM 100-5 MCG/ACT IN AERO: 2.0000 | INHALATION_SPRAY | Freq: Two times a day (BID) | RESPIRATORY_TRACT | 0 refills | Status: DC

## 2018-01-10 MED ORDER — CLOPIDOGREL BISULFATE 75 MG PO TABS: 75.0000 mg | ORAL_TABLET | Freq: Every day | ORAL | 0 refills | Status: DC

## 2018-01-10 MED ORDER — POLYETHYLENE GLYCOL 3350 17 G PO PACK: 17.0000 g | PACK | Freq: Every day | ORAL | 0 refills | Status: DC

## 2018-01-10 MED ORDER — METHYLPREDNISOLONE 8 MG PO TABS: 8.0000 mg | ORAL_TABLET | Freq: Every day | ORAL | 0 refills | Status: DC

## 2018-01-10 MED ORDER — LEVOFLOXACIN 750 MG PO TABS: 750.0000 mg | ORAL_TABLET | Freq: Every day | ORAL | 0 refills | Status: AC

## 2018-01-10 MED ORDER — POTASSIUM CHLORIDE CRYS ER 10 MEQ PO TBCR: 10.0000 meq | EXTENDED_RELEASE_TABLET | Freq: Every day | ORAL | 0 refills | Status: DC

## 2018-01-10 MED ORDER — METHYLPREDNISOLONE 4 MG PO TABS
8.0000 mg | ORAL_TABLET | Freq: Every day | ORAL | Status: DC
  Filled 2018-01-10: qty 2

## 2018-01-10 MED ORDER — PROMETHAZINE-CODEINE 6.25-10 MG/5ML PO SYRP: 5.0000 mL | ORAL_SOLUTION | Freq: Four times a day (QID) | ORAL | 0 refills | Status: DC | PRN

## 2018-01-10 MED ORDER — TRAZODONE HCL 50 MG PO TABS: 50.0000 mg | ORAL_TABLET | Freq: Every evening | ORAL | Status: DC | PRN

## 2018-01-10 MED ORDER — FOLIC ACID 1 MG PO TABS: 1.0000 mg | ORAL_TABLET | Freq: Every day | ORAL | 0 refills | Status: DC

## 2018-01-10 MED ORDER — LEVOTHYROXINE SODIUM 112 MCG PO TABS: 112.0000 ug | ORAL_TABLET | Freq: Every day | ORAL | 0 refills | Status: DC

## 2018-01-10 MED ORDER — FUROSEMIDE 40 MG PO TABS
40.0000 mg | ORAL_TABLET | Freq: Every day | ORAL | Status: DC
  Administered 2018-01-10: 40 mg via ORAL
  Filled 2018-01-10: qty 1

## 2018-01-10 MED ORDER — PNEUMOCOCCAL VAC POLYVALENT 25 MCG/0.5ML IJ INJ: 0.5000 mL | INJECTION | INTRAMUSCULAR | Status: DC

## 2018-01-10 MED ORDER — ATORVASTATIN CALCIUM 20 MG PO TABS: 20.0000 mg | ORAL_TABLET | Freq: Every day | ORAL | 0 refills | Status: DC

## 2018-01-10 MED ORDER — GUAIFENESIN ER 600 MG PO TB12: 1200.0000 mg | ORAL_TABLET | Freq: Every day | ORAL | 0 refills | Status: DC

## 2018-01-10 MED ORDER — BENZONATATE 100 MG PO CAPS: 100.0000 mg | ORAL_CAPSULE | Freq: Three times a day (TID) | ORAL | 0 refills | Status: DC | PRN

## 2018-01-10 MED ORDER — PANTOPRAZOLE SODIUM 40 MG PO TBEC: 40.0000 mg | DELAYED_RELEASE_TABLET | Freq: Two times a day (BID) | ORAL | 0 refills | Status: DC

## 2018-01-10 MED ORDER — BISACODYL 10 MG RE SUPP: 10.0000 mg | Freq: Every day | RECTAL | 0 refills | Status: DC | PRN

## 2018-01-10 MED ORDER — POTASSIUM CHLORIDE CRYS ER 10 MEQ PO TBCR: 10.0000 meq | EXTENDED_RELEASE_TABLET | Freq: Every day | ORAL | Status: DC

## 2018-01-10 MED ORDER — METHYLPREDNISOLONE 4 MG PO TABS: 4.0000 mg | ORAL_TABLET | Freq: Every day | ORAL | Status: DC

## 2018-01-10 MED ORDER — SENNOSIDES-DOCUSATE SODIUM 8.6-50 MG PO TABS: 2.0000 | ORAL_TABLET | Freq: Every day | ORAL | 0 refills | Status: DC

## 2018-01-10 MED ORDER — FUROSEMIDE 40 MG PO TABS: 40.0000 mg | ORAL_TABLET | Freq: Every day | ORAL | 0 refills | Status: DC

## 2018-01-10 MED ORDER — POTASSIUM CHLORIDE CRYS ER 20 MEQ PO TBCR
40.0000 meq | EXTENDED_RELEASE_TABLET | Freq: Once | ORAL | Status: AC
  Administered 2018-01-10: 40 meq via ORAL
  Filled 2018-01-10: qty 2

## 2018-01-10 MED ORDER — TRAZODONE HCL 50 MG PO TABS: 50.0000 mg | ORAL_TABLET | Freq: Every evening | ORAL | 0 refills | Status: DC | PRN

## 2018-01-10 MED ORDER — LEVALBUTEROL HCL 1.25 MG/0.5ML IN NEBU: 1.2500 mg | INHALATION_SOLUTION | Freq: Three times a day (TID) | RESPIRATORY_TRACT | 0 refills | Status: DC

## 2018-01-10 MED ORDER — METHYLPREDNISOLONE 4 MG PO TABS: 4.0000 mg | ORAL_TABLET | Freq: Every day | ORAL | 0 refills | Status: DC

## 2018-01-10 NOTE — Discharge Instructions (Signed)
Asthma, Adult °Asthma is a condition of the lungs in which the airways tighten and narrow. Asthma can make it hard to breathe. Asthma cannot be cured, but medicine and lifestyle changes can help control it. Asthma may be started (triggered) by: °· Animal skin flakes (dander). °· Dust. °· Cockroaches. °· Pollen. °· Mold. °· Smoke. °· Cleaning products. °· Hair sprays or aerosol sprays. °· Paint fumes or strong smells. °· Cold air, weather changes, and winds. °· Crying or laughing hard. °· Stress. °· Certain medicines or drugs. °· Foods, such as dried fruit, potato chips, and sparkling grape juice. °· Infections or conditions (colds, flu). °· Exercise. °· Certain medical conditions or diseases. °· Exercise or tiring activities. ° °Follow these instructions at home: °· Take medicine as told by your doctor. °· Use a peak flow meter as told by your doctor. A peak flow meter is a tool that measures how well the lungs are working. °· Record and keep track of the peak flow meter's readings. °· Understand and use the asthma action plan. An asthma action plan is a written plan for taking care of your asthma and treating your attacks. °· To help prevent asthma attacks: °? Do not smoke. Stay away from secondhand smoke. °? Change your heating and air conditioning filter often. °? Limit your use of fireplaces and wood stoves. °? Get rid of pests (such as roaches and mice) and their droppings. °? Throw away plants if you see mold on them. °? Clean your floors. Dust regularly. Use cleaning products that do not smell. °? Have someone vacuum when you are not home. Use a vacuum cleaner with a HEPA filter if possible. °? Replace carpet with wood, tile, or vinyl flooring. Carpet can trap animal skin flakes and dust. °? Use allergy-proof pillows, mattress covers, and box spring covers. °? Wash bed sheets and blankets every week in hot water and dry them in a dryer. °? Use blankets that are made of polyester or cotton. °? Clean bathrooms  and kitchens with bleach. If possible, have someone repaint the walls in these rooms with mold-resistant paint. Keep out of the rooms that are being cleaned and painted. °? Wash hands often. °Contact a doctor if: °· You have make a whistling sound when breaking (wheeze), have shortness of breath, or have a cough even if taking medicine to prevent attacks. °· The colored mucus you cough up (sputum) is thicker than usual. °· The colored mucus you cough up changes from clear or white to yellow, green, gray, or bloody. °· You have problems from the medicine you are taking such as: °? A rash. °? Itching. °? Swelling. °? Trouble breathing. °· You need reliever medicines more than 2-3 times a week. °· Your peak flow measurement is still at 50-79% of your personal best after following the action plan for 1 hour. °· You have a fever. °Get help right away if: °· You seem to be worse and are not responding to medicine during an asthma attack. °· You are short of breath even at rest. °· You get short of breath when doing very little activity. °· You have trouble eating, drinking, or talking. °· You have chest pain. °· You have a fast heartbeat. °· Your lips or fingernails start to turn blue. °· You are light-headed, dizzy, or faint. °· Your peak flow is less than 50% of your personal best. °This information is not intended to replace advice given to you by your health care provider. Make   sure you discuss any questions you have with your health care provider. °Document Released: 09/09/2007 Document Revised: 08/29/2015 Document Reviewed: 10/20/2012 °Elsevier Interactive Patient Education © 2017 [employer] ° ° °Asthma Attack Prevention, Adult °Although you may not be able to control the fact that you have asthma, you can take actions to prevent episodes of asthma (asthma attacks). These actions include: °· Creating a written plan for managing and treating your asthma attacks (asthma action plan). °· Monitoring your  asthma. °· Avoiding things that can irritate your airways or make your asthma symptoms worse (asthma triggers). °· Taking your medicines as directed. °· Acting quickly if you have signs or symptoms of an asthma attack. ° °What are some ways to prevent an asthma attack? °Create a plan °Work with your health care provider to create an asthma action plan. This plan should include: °· A list of your asthma triggers and how to avoid them. °· A list of symptoms that you experience during an asthma attack. °· Information about when to take medicine and how much medicine to take. °· Information to help you understand your peak flow measurements. °· Contact information for your health care providers. °· Daily actions that you can take to control asthma. ° °Monitor your asthma ° °To monitor your asthma: °· Use your peak flow meter every morning and every evening for 2-3 weeks. Record the results in a journal. A drop in your peak flow numbers on one or more days may mean that you are starting to have an asthma attack, even if you are not having symptoms. °· When you have asthma symptoms, write them down in a journal. ° °Avoid asthma triggers ° °Work with your health care provider to find out what your asthma triggers are. This can be done by: °· Being tested for allergies. °· Keeping a journal that notes when asthma attacks occur and what may have contributed to them. °· Asking your health care provider whether other medical conditions make your asthma worse. ° °Common asthma triggers include: °· Dust. °· Smoke. This includes campfire smoke and secondhand smoke from tobacco products. °· Pet dander. °· Trees, grasses or pollens. °· Very cold, dry, or humid air. °· Mold. °· Foods that contain high amounts of sulfites. °· Strong smells. °· Engine exhaust and air pollution. °· Aerosol sprays and fumes from household cleaners. °· Household pests and their droppings, including dust mites and cockroaches. °· Certain medicines,  including NSAIDs. ° °Once you have determined your asthma triggers, take steps to avoid them. Depending on your triggers, you may be able to reduce the chance of an asthma attack by: °· Keeping your home clean. Have someone dust and vacuum your home for you 1 or 2 times a week. If possible, have them use a high-efficiency particulate arrestance (HEPA) vacuum. °· Washing your sheets weekly in hot water. °· Using allergy-proof mattress covers and casings on your bed. °· Keeping pets out of your home. °· Taking care of mold and water problems in your home. °· Avoiding areas where people smoke. °· Avoiding using strong perfumes or odor sprays. °· Avoid spending a lot of time outdoors when pollen counts are high and on very windy days. °· Talking with your health care provider before stopping or starting any new medicines. ° °Medicines °Take over-the-counter and prescription medicines only as told by your health care provider. Many asthma attacks can be prevented by carefully following your medicine schedule. Taking your medicines correctly is especially important when you   cannot avoid certain asthma triggers. Even if you are doing well, do not stop taking your medicine and do not take less medicine. Act quickly If an asthma attack happens, acting quickly can decrease how severe it is and how long it lasts. Take these actions:  Pay attention to your symptoms. If you are coughing, wheezing, or having difficulty breathing, do not wait to see if your symptoms go away on their own. Follow your asthma action plan.  If you have followed your asthma action plan and your symptoms are not improving, call your health care provider or seek immediate medical care at the nearest hospital.  It is important to write down how often you need to use your fast-acting rescue inhaler. You can track how often you use an inhaler in your journal. If you are using your rescue inhaler more often, it may mean that your asthma is not under  control. Adjusting your asthma treatment plan may help you to prevent future asthma attacks and help you to gain better control of your condition. How can I prevent an asthma attack when I exercise?  Exercise is a common asthma trigger. To prevent asthma attacks during exercise:  Follow advice from your health care provider about whether you should use your fast-acting inhaler before exercising. Many people with asthma experience exercise-induced bronchoconstriction (EIB). This condition often worsens during vigorous exercise in cold, humid, or dry environments. Usually, people with EIB can stay very active by using a fast-acting inhaler before exercising.  Avoid exercising outdoors in very cold or humid weather.  Avoid exercising outdoors when pollen counts are high.  Warm up and cool down when exercising.  Stop exercising right away if asthma symptoms start.  Consider taking part in exercises that are less likely to cause asthma symptoms such as:  Indoor swimming.  Biking.  Walking.  Hiking.  Playing football.  This information is not intended to replace advice given to you by your health care provider. Make sure you discuss any questions you have with your health care provider. Document Released: 03/11/2009 Document Revised: 11/22/2015 Document Reviewed: 09/07/2015 Elsevier Interactive Patient Education  2018 [employer].   Asthma, Acute Bronchospasm Acute bronchospasm caused by asthma is also referred to as an asthma attack. Bronchospasm means your air passages become narrowed. The narrowing is caused by inflammation and tightening of the muscles in the air tubes (bronchi) in your lungs. This can make it hard to breathe or cause you to wheeze and cough. What are the causes? Possible triggers are:  Animal dander from the skin, hair, or feathers of animals.  Dust mites contained in house dust.  Cockroaches.  Pollen from trees or grass.  Mold.  Cigarette or tobacco  smoke.  Air pollutants such as dust, household cleaners, hair sprays, aerosol sprays, paint fumes, strong chemicals, or strong odors.  Cold air or weather changes. Cold air may trigger inflammation. Winds increase molds and pollens in the air.  Strong emotions such as crying or laughing hard.  Stress.  Certain medicines such as aspirin or beta-blockers.  Sulfites in foods and drinks, such as dried fruits and wine.  Infections or inflammatory conditions, such as a flu, cold, or inflammation of the nasal membranes (rhinitis).  Gastroesophageal reflux disease (GERD). GERD is a condition where stomach acid backs up into your esophagus.  Exercise or strenuous activity.  What are the signs or symptoms?  Wheezing.  Excessive coughing, particularly at night.  Chest tightness.  Shortness of breath. How is this  diagnosed? Your health care provider will ask you about your medical history and perform a physical exam. A chest X-ray or blood testing may be performed to look for other causes of your symptoms or other conditions that may have triggered your asthma attack. How is this treated? Treatment is aimed at reducing inflammation and opening up the airways in your lungs. Most asthma attacks are treated with inhaled medicines. These include quick relief or rescue medicines (such as bronchodilators) and controller medicines (such as inhaled corticosteroids). These medicines are sometimes given through an inhaler or a nebulizer. Systemic steroid medicine taken by mouth or given through an IV tube also can be used to reduce the inflammation when an attack is moderate or severe. Antibiotic medicines are only used if a bacterial infection is present. Follow these instructions at home:  Rest.  Drink plenty of liquids. This helps the mucus to remain thin and be easily coughed up. Only use caffeine in moderation and do not use alcohol until you have recovered from your illness.  Do not smoke.  Avoid being exposed to secondhand smoke.  You play a critical role in keeping yourself in good health. Avoid exposure to things that cause you to wheeze or to have breathing problems.  Keep your medicines up-to-date and available. Carefully follow your health care providers treatment plan.  Take your medicine exactly as prescribed.  When pollen or pollution is bad, keep windows closed and use an air conditioner or go to places with air conditioning.  Asthma requires careful medical care. See your health care provider for a follow-up as advised. If you are more than [redacted] weeks pregnant and you were prescribed any new medicines, let your obstetrician know about the visit and how you are doing. Follow up with your health care provider as directed.  After you have recovered from your asthma attack, make an appointment with your outpatient doctor to talk about ways to reduce the likelihood of future attacks. If you do not have a doctor who manages your asthma, make an appointment with a primary care doctor to discuss your asthma. Get help right away if:  You are getting worse.  You have trouble breathing. If severe, call your local emergency services (911 in the [place]  You develop chest pain or discomfort.  You are vomiting.  You are not able to keep fluids down.  You are coughing up yellow, green, brown, or bloody sputum.  You have a fever and your symptoms suddenly get worse.  You have trouble swallowing. This information is not intended to replace advice given to you by your health care provider. Make sure you discuss any questions you have with your health care provider. Document Released: 07/08/2006 Document Revised: 09/04/2015 Document Reviewed: 09/28/2012 Elsevier Interactive Patient Education  2017 [place], Adult Introduction Name: ________________________________ Date: _______ Follow-Up Visit With Health Care Provider Bring your medicines to your  follow-up visits.  Health care provider name: ____________________  Telephone: ____________________  How often should I see my health care provider? ____________________  The actions that you should take to control your asthma are based on the symptoms that you are having. The condition can be divided into 3 zones: the green zone, yellow zone, and red zone. Follow the action steps for the zone that you are in each day. Green zone: when asthma is under control  Signs and symptoms You may not have any symptoms while you are in the green zone. This means that  you:  Have no coughing or wheezing, even while you are working or playing.  Sleep through the night.  Are breathing well.  If you use a peak flow meter: The peak flow is above ______ (80% of your personal best or greater). You should take these medicines every day:  Controller medicine and dosage: ________________  Controller medicine and dosage: ________________  Controller medicine and dosage: ________________  Controller medicine and dosage: ________________  Before exercise, use this reliever or rescue medicine: ________________  Call your health care provider if:  You are using a reliever or rescue medicine more than 2-3 times per week.  Yellow zone: when asthma is getting worse  Signs and symptoms When you are in the yellow zone, you may have symptoms that interfere with exercise, are noticeably worse after exposure to triggers, or are worse at the first sign of a cold (upper respiratory infection). These may include:  Waking from sleep.  Coughing, especially at night or first thing in the morning.  Mild wheezing.  Chest tightness.  If you use a peak flow meter: The peak flow is _____ to _____ (50-79% of your personal best). Add the following medicine to the ones that you use daily:  Reliever or rescue medicine and dosage: ________________  Additional medicine and dosage: ________________  Call your  health care provider if:  You are using a reliever or rescue medicine more than 2-3 times per week.  You remain in the yellow zone for _____ hours.  Red zone: when asthma is severe  Signs and symptoms You will likely feel distressed and have symptoms at rest that restrict your activity. You are in the red zone if:  Your breathing is hard and quickly.  Your nose opens wide, your ribs show, and your neck muscles become visible when you breathe in.  Your lips, fingers, or toes are a bluish color.  You have trouble speaking in full sentences.  Your symptoms do not improve within 15-20 minutes after you use your reliever or rescue medicine (bronchodilator).  If you use a peak flow meter: The peak flow is less than _____ (less than 50% of your personal best). Call your local emergency services (911 in the [place] right away or seek help at the emergency department of the nearest hospital. Use your reliever or rescue medicine.  Start a nebulizer treatment or take 2-4 puffs from a metered-dose inhaler with a spacer.  Repeat this action every 15-20 minutes until help arrives.  What are some common asthma triggers? Discuss your asthma triggers with your health care provider. Some common triggers are:  Dander from the skin, hair, or feathers of animals.  Dust mites.  Cockroaches.  Pollen from trees or grass.  Mold.  Cigarette or tobacco smoke.  Air pollutants, such as dust, household cleaners, hair sprays, aerosol sprays, scented candles, paint fumes, strong chemicals, or strong odors.  Cold air or changes in weather. Cold air may cause inflammation. Winds increase molds and pollens in the air.  Strong emotions, such as crying or laughing hard.  Stress.  Certain medicines, such as aspirin or beta blockers.  Sulfites in foods and drinks, such as dried fruits and wine.  Infections or inflammatory conditions, such as: ? Flu (influenza). ? Upper respiratory tract  infection. ? Lower respiratory tract infection (pneumonia or bronchitis). ? Inflammation of the nasal membranes (rhinitis).  Gastroesophageal reflux disease (GERD). GERD is a condition in which stomach acid comes up into the throat (esophagus).  Exercise or strenuous  activity.  This information is not intended to replace advice given to you by your health care provider. Make sure you discuss any questions you have with your health care provider. Document Released: 01/18/2009 Document Revised: 11/18/2015 Document Reviewed: 01/09/2014 Elsevier Interactive Patient Education  2018 [place].   Cough, Adult A cough helps to clear your throat and lungs. A cough may last only 2-3 weeks (acute), or it may last longer than 8 weeks (chronic). Many different things can cause a cough. A cough may be a sign of an illness or another medical condition. Follow these instructions at home:  Pay attention to any changes in your cough.  Take medicines only as told by your doctor. ? If you were prescribed an antibiotic medicine, take it as told by your doctor. Do not stop taking it even if you start to feel better. ? Talk with your doctor before you try using a cough medicine.  Drink enough fluid to keep your pee (urine) clear or pale yellow.  If the air is dry, use a cold steam vaporizer or humidifier in your home.  Stay away from things that make you cough at work or at home.  If your cough is worse at night, try using extra pillows to raise your head up higher while you sleep.  Do not smoke, and try not to be around smoke. If you need help quitting, ask your doctor.  Do not have caffeine.  Do not drink alcohol.  Rest as needed. Contact a doctor if:  You have new problems (symptoms).  You cough up yellow fluid (pus).  Your cough does not get better after 2-3 weeks, or your cough gets worse.  Medicine does not help your cough and you are not sleeping well.  You have pain that gets  worse or pain that is not helped with medicine.  You have a fever.  You are losing weight and you do not know why.  You have night sweats. Get help right away if:  You cough up blood.  You have trouble breathing.  Your heartbeat is very fast. This information is not intended to replace advice given to you by your health care provider. Make sure you discuss any questions you have with your health care provider. Document Released: 12/04/2010 Document Revised: 08/29/2015 Document Reviewed: 05/30/2014 Elsevier Interactive Patient Education  2018 [employer].   How to Use a Nebulizer, Adult A nebulizer is a device that turns liquid medicine into a mist (vapor) that you can breathe in (inhale). You may need to use a nebulizer if you have a breathing illness, such as asthma or pneumonia. There are different kinds of nebulizers. With some, you breathe in through a mouthpiece. With others, a mask fits over your nose and mouth. Risks and complications Using a nebulizer that does not fit right or is not cleaned right can lead to the following complications:  Infection.  Eye irritation.  Delivery of too much medicine or not enough medicine.  Mouth irritation.  How to prepare before using a nebulizer Take these steps before using your nebulizer: 1. Check your medicine. Make sure it has not expired and is not damaged in any way. 2. Wash your hands with soap and water. 3. Put all of the parts of your nebulizer on a sturdy, flat surface. Make sure all of the tubing is connected. 4. Measure the liquid medicine according to instructions from your health care provider. Pour the liquid into the part of the nebulizer that  holds the medicine (reservoir). 5. Attach the mouthpiece or mask. 6. Test the nebulizer by turning it on to make sure that a spray comes out. Then, turn it off.  How to use a nebulizer 1. Sit down and relax. 2. If your nebulizer has a mask, put it over your nose and mouth.  It should fit somewhat snugly, with no gaps around the nose or cheeks where medicine could escape. If you use a mouthpiece, put it in your mouth. Press your lips firmly around the mouthpiece. 3. Turn on the nebulizer. 4. Breathe out (exhale). 5. Some nebulizers have a finger valve. If yours does, cover up the air hole so the air gets to the nebulizer. 6. Once the medicine begins to mist out, take slow, deep breaths. If there is a finger valve, release it at the end of your breath. 7. Continue taking slow, deep breaths until the medicine in the nebulizer is gone and no vapor appears. Be sure to stop the machine at any time if you start coughing or if the medicine foams or bubbles. How to clean a nebulizer The nebulizer and all of its parts must be kept very clean. If the nebulizer and its parts are not cleaned properly, bacteria can grow inside of them. If you inhale the bacteria, you can get sick. Follow the manufacturer's instructions for cleaning your nebulizer. For most nebulizers, you should follow these guidelines:  Wash the nebulizer after each use. Use warm water and soap. Make sure to wash the mouthpiece or mask and the medicine area, but do not wash the tubing or mouthpiece.  After you wash the nebulizer, place its parts on a clean towel and let them dry completely. After they dry, reconnect the pieces and turn the nebulizer on without any medicine in it. Doing this will blow air through the equipment to help dry it out.  Store the nebulizer in a clean and dust-free place.  Check the filter at least one time every week. Replace it if it looks dirty.  Contact a health care provider if:  You continue to have trouble breathing.  You have trouble using the nebulizer.  Your breathing gets worse during a nebulizer treatment.  Your nebulizer stops working, foams, or does not create a mist after you add medicine and turn it on. This information is not intended to replace advice given to  you by your health care provider. Make sure you discuss any questions you have with your health care provider. Document Released: 03/11/2009 Document Revised: 11/21/2015 Document Reviewed: 09/28/2015 Elsevier Interactive Patient Education  2018 [place] of Breath, Adult Shortness of breath means you have trouble breathing. Your lungs are organs for breathing. Follow these instructions at home: Pay attention to any changes in your symptoms. Take these actions to help with your condition:  Do not smoke. Smoking can cause shortness of breath. If you need help to quit smoking, ask your doctor.  Avoid things that can make it harder to breathe, such as: ? Mold. ? Dust. ? Air pollution. ? Chemical smells. ? Things that can cause allergy symptoms (allergens), if you have allergies.  Keep your living space clean and free of mold and dust.  Rest as needed. Slowly return to your usual activities.  Take over-the-counter and prescription medicines, including oxygen and inhaled medicines, only as told by your doctor.  Keep all follow-up visits as told by your doctor. This is important.  Contact a doctor if:  Your condition  does not get better as soon as expected.  You have a hard time doing your normal activities, even after you rest.  You have new symptoms. Get help right away if:  You have trouble breathing when you are resting.  You feel light-headed or you faint.  You have a cough that is not helped by medicines.  You cough up blood.  You have pain with breathing.  You have pain in your chest, arms, shoulders, or belly (abdomen).  You have a fever.  You cannot walk up stairs.  You cannot exercise the way you normally do. This information is not intended to replace advice given to you by your health care provider. Make sure you discuss any questions you have with your health care provider. Document Released: 09/09/2007 Document Revised: 04/09/2016 Document  Reviewed: 04/09/2016 Elsevier Interactive Patient Education  2017 [place] Lip Breathing Pursed lip breathing is a technique to relieve the feeling of being short of breath. Some long-term respiratory conditions, like chronic obstructive pulmonary disease (COPD) and severe asthma, can make it hard to breathe out (exhale) all of the air in your lungs. This can make air that has less oxygen than normal build up in your lungs (air trapping). Trapped air means your lungs fill with less fresh air when you breathe in (inhale). As a result, you feel short of breath. Pursed lip breathing keeps your airways open longer when you exhale and empties more air from your lungs. This makes more space for fresh air when you inhale. Pursed lip breathing can also slow down your breathing and help your body not have to work so hard to breathe. Over time, pursed lip breathing may help you be able to be more physically active and do more activities. How to perform pursed lip breathing Being short of breath can make you tense and anxious. Before you start this breathing exercise, take a minute to relax your shoulders and close your eyes. Then: 1. Start the exercise by closing your mouth. 2. Breathe in through your nose, taking a normal breath. You can do this at your normal rate of breathing. If you feel you are not getting enough air, breathe in while slowly counting to 2 or 3. 3. Pucker (purse) your lips as if you were going to whistle. 4. Gently tighten your abdomen muscles or press on your belly to help push the air out. 5. Breathe out slowly through your pursed lips. Take at least twice as long to breathe out as it takes you to breathe in. 6. Make sure that you breathe out all of the air, but do not force air out. 7. Repeat the exercise until your breathing improves. Ask your health care provider how often and how long to do this exercise.  Follow these instructions at home:  Take over-the-counter  and prescription medicines only as told by your health care provider.  Return to your normal activities as told by your health care provider. Ask your health care provider what activities are safe for you.  Do not use any products that contain nicotine or tobacco, such as cigarettes and e-cigarettes. If you need help quitting, ask your health care provider.  Keep all follow-up visits as told by your health care provider. This is important. Contact a health care provider if:  Your shortness of breath gets worse.  You become less able to exercise or be active.  You develop a cough.  You develop a fever. Get help right away  if:  You are struggling to breathe.  Your shortness of breath prevents you from engaging in any activity. Summary  Pursed lip breathing is a breathing technique that helps to remove trapped air from your lungs. It helps you get more oxygen into your lungs and makes your body have to work less hard to breathe.  Pursed lip breathing can gradually make you more able to be physically active.  You can do pursed lip breathing on your own at home.  Ask your health care provider how often and how long you should do pursed lip breathing. This information is not intended to replace advice given to you by your health care provider. Make sure you discuss any questions you have with your health care provider. Document Released: 12/31/2007 Document Revised: 02/13/2016 Document Reviewed: 02/13/2016 Elsevier Interactive Patient Education  2017 [employer].

## 2018-01-10 NOTE — Discharge Summary (Addendum)
Discharge Summary  Alexandria [name]:[id number] DOB: 03-30-1946  PCP: Alexandria Held, DO  Admit date: 01/06/2018 Discharge date: 01/10/2018  Time spent: 25 minutes  Recommendations for Outpatient Follow-up:  1. Follow-up with pulmonology 2. Follow-up with PCP 3. Take your medications as prescribed Follow up on 01/17/2018  AT DR Alexandria [name]    Instructions: This appointment is for the Alexandria [name]  Please arrive at 3:45 PM for a 4 pm appointment       <<< PULMONOLOGY RECOMMENDATIONS: Transition to a prednisone taper Continue nebulizers: scheduledSpiriva inhalerand prn Xopenex Nebs Finish a 7 day course of levaquin Continue cough suppression with syrupfor cough  BID protonix Do not see need for nasal steroids Sips of water instead of throat clearing Sugar free hard candies for throat soothing  Follow up with Pulmonary within 1 week of discharge Follow up with Alexandria [name] 01/17/18 as scheduled  Steroids as above for a flare Follow up with Alexandria [name] as OP Consider ILD support group Consider anti fibrotics at OP follow up, not to be done while in the hospital Ambulatory desat study prior to discharge for home O2  Add mucinex 1200 mg once daily with full glass of water as mucolytic Flutter valve ( Please educate patient on use) Levaquin as above Will need sputum culture as OP once off antibiotics for 6-8 weeks  Ambulatory oxygenation at DC Consider Pulmonary rehab at discharge Oxygen as needed for sats> 92% Will need 6 minute walk as OP  Continue Steroids and pred taper at discharge Continue scheduled Xopenex  Continue Dulera and Spiriva  Consider Follow up with allergy  Note eosinophils ( 6.3 on 11/30/2017)  SUMMARY OF TODAY'S PLAN:  Continue treatment as above.  F/U as arranged above.  Cough will not resolve in the short term.  It is likely chronic. >>>    Discharge Diagnoses:  Active Hospital Problems   Diagnosis Date Noted    . Interstitial lung disease (Alexandria [name]) 01/07/2018  . Dyspnea 01/07/2018  . TIA (transient ischemic attack) 01/06/2018  . Hypokalemia 12/15/2017  . Diastolic dysfunction 31/49/7026  . Hypothyroidism 10/25/2014  . DOE (dyspnea on exertion) 10/03/2013    Resolved Hospital Problems  No resolved problems to display.    Discharge Condition: Stable  Diet recommendation: Resume previous diet  Vitals:   01/10/18 0830 01/10/18 0838  BP:    Pulse:    Resp:    Temp:    SpO2: 96% 96%    History of present illness:   Alexandria [name] an 72 y.o.femalewith medical history significant ofTIA, hypothyroidism, DVT, who presents with cough, shortness of breath.  Patient states that she has been having cough and shortness of breath for almost 2 weeks, which has been progressively getting worse.She denies chest pain, fever or chills. Patient was seen by Alexandria [name] for pulmonology on 9/23, concerned for bronchiectasis.He orderedlung function test and high-resolution CT scan. Pt was instructed to continue Symbicort inhaler. 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 She was seen in UC on 9/23/19and put on Augmentin for 5 days without improvement, and wasseen in urgent care again on 10/1, and switchedantibiotic to Levaquin. She states that her symptoms has been worsening.  01/08/18: Persistent dry cough and exertional dyspnea. 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 Denies chest pain or palpitations.  01/09/2018: Cough is persistent but improving.  Yesterday started on pulmonary toilet.  Added chest physiotherapy today.  Pulmonology will see also today.   01/10/2018: Patient seen and examined at bedside.   No acute events overnight except she could not sleep due to being on steroids.  Will order trazodone as needed at discharge to take for insomnia while on steroids.   Hospital Course:  Active Problems:   DOE (dyspnea on exertion)   Hypothyroidism   Diastolic dysfunction   Hypokalemia   TIA (transient ischemic attack)   Interstitial lung disease (Alexandria [name])   Dyspnea  Intractable cough  Independtly reviewed CT chest done on 01/06/2018 with patient revealed interstitial lung disease. CT angio negative for PE Respiratory virus panel negative Last procalcitonin negative Completed treatment with IV Solu-Medrol Continue nebulizers as needed for dyspnea and wheezing Completed 3 days of doxycycline Continue p.o. Levaquin 750 mg daily x5 days Continue p.o. Protonix 40 mg twice daily Continue Lasix 40 mg daily from twice daily Continue steroids, Medrol dose pack until sees pulmonology Keep your appointment with Alexandria [name] on 01/18/2018 as scheduled Passed home O2 evaluation testing with no need for supplemental oxygen.  Mild hypokalemia Potassium 3.4 Repleted with p.o. potassium 40 mEq once Discharged with maintenance p.o. supplement while on Lasix 10 mEq daily while on 40 mg of Lasix daily Follow-up with your PCP outpatient  Chronic constipation Continue bowel regimen 2 tablets of Senokot twice daily, MiraLAX daily, Dulcolax suppository as needed  Asthma Management as stated above  Hypothyroidism: -continue synthroid  Chronic Diastolic WYO:3Z echo on 8/58/8502 showed EF of 55-60% with grade 1 diastolic dysfunction. No pulmonary edema on CT scan of chest.  -Continue Lasix 40 mg daily -BNP 270 but no prior to compare  -Follow-up with cardiology -Heart cath in 2015   History of MI  Denies chest pain No acute issues Continue to monitor on telemetry Continue low-dose statin  Hyperlipidemia LDL 117 on 11/26/2017 Continue Lipitor 20 mg daily  Hx ofTIA (transient  ischemic attack): -Continue plavix  History of bilateral knee replacement No acute issues PT recommended to consider outpatient pulmonary rehab Out of bed to chair Fall precautions   Code Status: full   Consultants:  Pulmonology  Procedures:  none  Antimicrobials:  P.o. Levaquin   Discharge Exam: BP (!) 168/78 (BP Location: Right Arm)   Pulse 63   Temp 97.8 F (36.6 C) (Oral)   Resp 16   Ht 5\' 4"  (1.626 m)   Wt 61.7 kg   SpO2 96%   BMI 23.36 kg/m  . General: 72 y.o. year-old female well developed well nourished in no acute distress.  Alert and oriented x3. . Cardiovascular: Regular rate and rhythm with no rubs or gallops.  No thyromegaly or JVD noted.   Marland Kitchen Respiratory: Diffuse wheezes posteriorly with mild rales at bases. Good inspiratory effort. . Abdomen: Soft nontender nondistended with normal bowel sounds x4 quadrants. . Musculoskeletal: No lower extremity edema. 2/4 pulses in all 4 extremities. . Skin: No ulcerative lesions noted or rashes, . Psychiatry: Mood is appropriate for condition and setting  Discharge Instructions You were cared for by a hospitalist during your hospital stay. If you have any questions about your discharge medications or the care you received while you were in the hospital after you are discharged, you can call the unit and asked to speak with the hospitalist on call  if the hospitalist that took care of you is not available. Once you are discharged, your primary care physician will handle any further medical issues. Please note that NO REFILLS for any discharge medications will be authorized once you are discharged, as it is imperative that you return to your primary care physician (or establish a relationship with a primary care physician if you do not have one) for your aftercare needs so that they can reassess your need for medications and monitor your lab values.  Discharge Instructions    DME Nebulizer machine   Complete by:   As directed    Patient needs a nebulizer to treat with the following condition:  Asthma exacerbation     Allergies as of 01/10/2018      Reactions   Prednisone Other (See Comments)   Changed personality    Atrovent Hfa [ipratropium Bromide Hfa] Other (See Comments)   Pt could not sleep   Cheratussin Ac [guaifenesin-codeine]    Codeine Other (See Comments)   Pt takes promethazine with codeine at home   Hydrocodone Itching   Influenza Vaccines Other (See Comments)   Pt reports heart attack after last flu shot   Motrin [ibuprofen] Other (See Comments)   "Gives false reading in blood"      Medication List    STOP taking these medications   amoxicillin-clavulanate 875-125 MG tablet Commonly known as:  AUGMENTIN   budesonide-formoterol 80-4.5 MCG/ACT inhaler Commonly known as:  SYMBICORT Replaced by:  mometasone-formoterol 100-5 MCG/ACT Aero     TAKE these medications   albuterol 108 (90 Base) MCG/ACT inhaler Commonly known as:  PROVENTIL HFA;VENTOLIN HFA Inhale 2 puffs into the lungs every 4 (four) hours as needed for wheezing.   atorvastatin 20 MG tablet Commonly known as:  LIPITOR Take 1 tablet (20 mg total) by mouth daily at 6 PM.   benzonatate 100 MG capsule Commonly known as:  TESSALON Take 1 capsule (100 mg total) by mouth 3 (three) times daily as needed for cough.   bisacodyl 10 MG suppository Commonly known as:  DULCOLAX Place 1 suppository (10 mg total) rectally daily as needed for moderate constipation.   BREWERS YEAST PO Take 2 tablets by mouth 2 (two) times daily.   clopidogrel 75 MG tablet Commonly known as:  PLAVIX Take 1 tablet (75 mg total) by mouth daily. Start taking on:  01/11/2018   dicyclomine 20 MG tablet Commonly known as:  BENTYL TAKE 1 TABLET BY MOUTH FOUR TIMES A DAY AS NEEDED What changed:  See the new instructions.   folic acid 1 MG tablet Commonly known as:  FOLVITE Take 1 tablet (1 mg total) by mouth daily. Start taking on:   01/11/2018 What changed:    medication strength  how much to take   furosemide 40 MG tablet Commonly known as:  LASIX Take 1 tablet (40 mg total) by mouth daily. Start taking on:  01/11/2018 What changed:  when to take this   guaiFENesin 600 MG 12 hr tablet Commonly known as:  MUCINEX Take 2 tablets (1,200 mg total) by mouth daily.   lactulose (encephalopathy) 10 GM/15ML Soln Commonly known as:  CHRONULAC TAKE 30 MLS (20 G TOTAL) BY MOUTH EVERY 6 (SIX) HOURS AS NEEDED. What changed:  See the new instructions.   levalbuterol 1.25 MG/0.5ML nebulizer solution Commonly known as:  XOPENEX Take 1.25 mg by nebulization 3 (three) times daily.   levofloxacin 750 MG tablet Commonly known as:  LEVAQUIN Take 1 tablet (750 mg  total) by mouth daily for 5 days. Start taking on:  01/11/2018   levothyroxine 112 MCG tablet Commonly known as:  SYNTHROID, LEVOTHROID Take 1 tablet (112 mcg total) by mouth daily before breakfast. Start taking on:  01/11/2018 What changed:  See the new instructions.   methylPREDNISolone 8 MG tablet Commonly known as:  MEDROL Take 1 tablet (8 mg total) by mouth daily.   methylPREDNISolone 4 MG tablet Commonly known as:  MEDROL Take 1 tablet (4 mg total) by mouth daily before breakfast. Start taking on:  01/11/2018   mometasone-formoterol 100-5 MCG/ACT Aero Commonly known as:  DULERA Inhale 2 puffs into the lungs 2 (two) times daily. Replaces:  budesonide-formoterol 80-4.5 MCG/ACT inhaler   multivitamin tablet Take 1 tablet by mouth daily.   OMEGA-3 FATTY ACIDS-VITAMIN E PO Take 1 capsule by mouth daily.   pantoprazole 40 MG tablet Commonly known as:  PROTONIX Take 1 tablet (40 mg total) by mouth 2 (two) times daily.   polyethylene glycol packet Commonly known as:  MIRALAX / GLYCOLAX Take 17 g by mouth daily.   potassium chloride 10 MEQ tablet Commonly known as:  K-DUR,KLOR-CON Take 1 tablet (10 mEq total) by mouth daily. Start taking on:   01/11/2018 What changed:    medication strength  how much to take  when to take this   promethazine-codeine 6.25-10 MG/5ML syrup Commonly known as:  PHENERGAN with CODEINE Take 5 mLs by mouth every 6 (six) hours as needed for cough. What changed:  when to take this   RA BIOTIN 1000 MCG tablet Generic drug:  Biotin Take 1,000 mcg by mouth 2 (two) times daily.   senna-docusate 8.6-50 MG tablet Commonly known as:  Senokot-S Take 2 tablets by mouth at bedtime.   traZODone 50 MG tablet Commonly known as:  DESYREL Take 1 tablet (50 mg total) by mouth at bedtime as needed for sleep.   vitamin C 500 MG tablet Commonly known as:  ASCORBIC ACID Take 1,000-2,000 mg by mouth daily.   vitamin D (CHOLECALCIFEROL) 400 units tablet Take 400 Units by mouth 2 (two) times daily.            Durable Medical Equipment  (From admission, onward)         Start     Ordered   01/10/18 1051  For home use only DME Nebulizer machine  Once    Question:  Patient needs a nebulizer to treat with the following condition  Answer:  Asthma exacerbation   01/10/18 1050   01/10/18 0000  DME Nebulizer machine    Question:  Patient needs a nebulizer to treat with the following condition  Answer:  Asthma exacerbation   01/10/18 1017         Allergies  Allergen Reactions  . Prednisone Other (See Comments)    Changed personality   . Atrovent Hfa [Ipratropium Bromide Hfa] Other (See Comments)    Pt could not sleep  . Cheratussin Ac [Guaifenesin-Codeine]   . Codeine Other (See Comments)    Pt takes promethazine with codeine at home  . Hydrocodone Itching  . Influenza Vaccines Other (See Comments)    Pt reports heart attack after last flu shot  . Motrin [Ibuprofen] Other (See Comments)    "Gives false reading in blood"   Follow-up Information    [name], MD Follow up on 01/17/2018.   Specialty:  Pulmonary Disease Why:  This appointment is for the [facility]  Please arrive at  3:45  for a 4 pm appointment Contact information: [place] [address] [phone]        Alexandria Held, DO. Call in 1 day(s).   Specialty:  Family Medicine Why:  Please call for a post hospital follow-up appointment. Contact information: [address] [place] [address] [phone]        [facility] - Dme Follow up.   WhySystems developer information: [address] [place] [address] [phone]            The results of significant diagnostics from this hospitalization (including imaging, microbiology, ancillary and laboratory) are listed below for reference.    Significant Diagnostic Studies: Ct Angio Chest Pe W And/or Wo Contrast  Result Date: 01/06/2018 CLINICAL DATA:  Cough and shortness of breath for 11 days. EXAM: CT ANGIOGRAPHY CHEST WITH CONTRAST TECHNIQUE: Multidetector CT imaging of the chest was performed using the standard protocol during bolus administration of intravenous contrast. Multiplanar CT image reconstructions and MIPs were obtained to evaluate the vascular anatomy. CONTRAST:  169mL ISOVUE-370 IOPAMIDOL (ISOVUE-370) INJECTION 76% COMPARISON:  CT chest 12/30/2017. FINDINGS: Cardiovascular: Satisfactory opacification of the pulmonary arteries to the segmental level. No evidence of pulmonary embolism. Normal heart size. No pericardial effusion. Aortic atherosclerosis. Aortic valve calcifications. Coronary artery calcification. Ectasia of ascending thoracic aorta. Mediastinum/Nodes: No enlarged mediastinal, hilar, or axillary lymph nodes. Thyroid gland, trachea, and esophagus demonstrate no significant findings. Lungs/Pleura: Multiple small calcified and noncalcified pulmonary nodules are noted, unchanged from recent CT. No acute consolidative airspace disease. Peripheral predominant areas of septal thickening and bronchiectasis. Upper Abdomen: Unremarkable. Musculoskeletal: No chest  wall abnormality. No acute or significant osseous findings. Review of the MIP images confirms the above findings. IMPRESSION: No evidence of PE. No acute consolidative airspace disease. Stable mild interstitial lung disease. 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 Recommend annual imaging followup by CTA or MRA. This recommendation follows 2010 ACCF/AHA/AATS/ACR/ASA/SCA/SCAI/SIR/STS/SVM Guidelines for the Diagnosis and Management of Patients with Thoracic Aortic Disease. Circulation. 2010; 121: L572-I203. Aortic Atherosclerosis (ICD10-I70.0). Electronically Signed   By: Staci Righter M.D.   On: 01/06/2018 14:31   Ct Chest High Resolution  Result Date: 12/30/2017 CLINICAL DATA:  72 year old female with history of cough for 1 year. Former smoker. EXAM: CT CHEST WITHOUT CONTRAST TECHNIQUE: Multidetector CT imaging of the chest was performed following the standard protocol without intravenous contrast. High resolution imaging of the lungs, as well as inspiratory and expiratory imaging, was performed. COMPARISON:  No priors. FINDINGS: Cardiovascular: Heart size is normal. There is no significant pericardial fluid, thickening or pericardial calcification. There is aortic atherosclerosis, as well as atherosclerosis of the great vessels of the mediastinum and the coronary arteries, including calcified atherosclerotic plaque in the left anterior descending coronary arteries. Calcifications of the aortic valve. 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 Mediastinum/Nodes: No pathologically enlarged mediastinal or hilar lymph nodes. Please note that accurate exclusion of hilar adenopathy is limited on noncontrast CT scans. Esophagus is unremarkable in appearance. No axillary lymphadenopathy. Lungs/Pleura: Multiple small calcified and noncalcified pulmonary nodules are noted throughout the lungs bilaterally. The largest noncalcified pulmonary nodule is in the right upper lobe near the apex  measuring 6 x 4 mm (mean diameter of 5 mm) on axial image 29 of series 5. No larger more suspicious appearing pulmonary nodules or masses are noted. No acute consolidative airspace disease. No pleural effusions. High-resolution images demonstrate peripheral predominant  areas of septal thickening and peripheral bronchiolectasis. No frank honeycombing. These findings have a very mild craniocaudal gradient. No traction bronchiectasis identified at this time. Inspiratory and expiratory imaging demonstrates mild air trapping indicative of mild small airways disease. No acute consolidative airspace disease. No pleural effusions. Upper Abdomen: Aortic atherosclerosis. Musculoskeletal: There are no aggressive appearing lytic or blastic lesions noted in the visualized portions of the skeleton. Orthopedic fixation hardware in the lower cervical spine. IMPRESSION: 1. 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 This is not yet definitive, particularly in light of the absence of honeycombing, lack of traction bronchiectasis and presence of mild air trapping, with primary differential consideration that of fibrotic phase nonspecific interstitial pneumonia (NSIP). Repeat high-resolution chest CT is recommended in 12 months to assess for temporal changes in the appearance of the lung parenchyma. 2. Aortic atherosclerosis, in addition to left anterior descending coronary artery disease. Assessment for potential risk factor modification, dietary therapy or pharmacologic therapy may be warranted, if clinically indicated. 3. There are calcifications of the aortic valve. Echocardiographic correlation for evaluation of potential valvular dysfunction may be warranted if clinically indicated. 4. 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 Recommend annual imaging followup by CTA or MRA.  This recommendation follows 2010 ACCF/AHA/AATS/ACR/ASA/SCA/SCAI/SIR/STS/SVM Guidelines for the Diagnosis and Management of Patients with Thoracic Aortic Disease. Circulation. 2010; 121: Y637-C588. Aortic Atherosclerosis (ICD10-I70.0). Electronically Signed   By: Vinnie Langton M.D.   On: 12/30/2017 08:33    Microbiology: Recent Results (from the past 240 hour(s))  Respiratory Panel by PCR     Status: None   Collection Time: 01/07/18  2:38 AM  Result Value Ref Range Status   Adenovirus NOT DETECTED NOT DETECTED Final   Coronavirus 229E NOT DETECTED NOT DETECTED Final   Coronavirus HKU1 NOT DETECTED NOT DETECTED Final   Coronavirus NL63 NOT DETECTED NOT DETECTED Final   Coronavirus OC43 NOT DETECTED NOT DETECTED Final   Metapneumovirus NOT DETECTED NOT DETECTED Final   Rhinovirus / Enterovirus NOT DETECTED NOT DETECTED Final   Influenza A NOT DETECTED NOT DETECTED Final   Influenza B NOT DETECTED NOT DETECTED Final   Parainfluenza Virus 1 NOT DETECTED NOT DETECTED Final   Parainfluenza Virus 2 NOT DETECTED NOT DETECTED Final   Parainfluenza Virus 3 NOT DETECTED NOT DETECTED Final   Parainfluenza Virus 4 NOT DETECTED NOT DETECTED Final   Respiratory Syncytial Virus NOT DETECTED NOT DETECTED Final   Bordetella pertussis NOT DETECTED NOT DETECTED Final   Chlamydophila pneumoniae NOT DETECTED NOT DETECTED Final   Mycoplasma pneumoniae NOT DETECTED NOT DETECTED Final    Comment: Performed at [facility], [address] [place], [place] [address]     Labs: Basic Metabolic Panel: Recent Labs  Lab 01/06/18 1324 01/07/18 0237 01/07/18 0824 01/09/18 0629 01/10/18 0438  NA 141  --  136 136 135  K 2.9*  --  4.0 3.3* 3.4*  CL 98  --  100 100 100  CO2 30  --  25 27 26   GLUCOSE 103*  --  237* 127* 125*  BUN 16  --  6* 16 15  CREATININE 0.96  --  0.93 0.77 0.78  CALCIUM 9.3  --  9.3 8.5* 8.4*  MG 2.2 2.9*  --   --   --    Liver Function Tests: No results for input(s): AST,  ALT,  ALKPHOS, BILITOT, PROT, ALBUMIN in the last 168 hours. No results for input(s): LIPASE, AMYLASE in the last 168 hours. No results for input(s): AMMONIA in the last 168 hours. CBC: Recent Labs  Lab 01/06/18 1324  WBC 7.1  NEUTROABS 2.9  HGB 14.0  HCT 40.1  MCV 94.1  PLT 238   Cardiac Enzymes: Recent Labs  Lab 01/07/18 0424 01/07/18 0824 01/07/18 1500  TROPONINI <0.03 <0.03 <0.03   BNP: BNP (last 3 results) Recent Labs    01/07/18 0237  BNP 270.1*    ProBNP (last 3 results) No results for input(s): PROBNP in the last 8760 hours.  CBG: No results for input(s): GLUCAP in the last 168 hours.     Signed:  Kayleen Memos, MD [facility] 01/10/2018, 11:56 AM

## 2018-01-10 NOTE — Plan of Care (Signed)
  Problem: Clinical Measurements: Goal: Respiratory complications will improve Outcome: Progressing   

## 2018-01-10 NOTE — Telephone Encounter (Signed)
Spoke with pt and was advised that she was not given her Xopenex neb med. Called the pharmacy and spoke with the pharmacist and was advised that the Xopenex is needing a PA. Called pt back and informed her of the PA and if she were to get the medication tonight she would have to pay out of pocket without the PA approval. Pt states she will see how much it is and possibly pay for it. This medication was not/never prescribed by Dr. Lake Bells or pulmonary.   Dr. Lake Bells please advise if you would like to move forward with PA process for Xopenex neb. Thanks.

## 2018-01-10 NOTE — Telephone Encounter (Signed)
Called patient to schedule TCM appointment with Dr. Carollee Herter. Left message for return call.

## 2018-01-10 NOTE — Care Management Note (Signed)
Case Management Note  Patient Details  Name: TAMECIA MCDOUGALD MRN: [id number] Date of Birth: 05-05-1945  Subjective/Objective: Pt presented for Cough and SOB. PTA Independent from home with husband.           Action/Plan: CM received referral for [facility] RN- pt declines needing [place] RN at this time. CM did see that patient is on nebulizer medications. CM did call MD to get order for Nebulizer. Pt wants to utilize [facility] for DME Services. Referral called to [facility] with [facility] and DME will be delivered to room prior to transition home. Pt has transportation home. No further needs from CM at this time.   Expected Discharge Date:  01/10/18               Expected Discharge Plan:  Home/Self Care  In-House Referral:  NA  Discharge planning Services  CM Consult  Post Acute Care Choice:  Durable Medical Equipment Choice offered to:  Patient  DME Arranged:  Nebulizer machine DME Agency:  [place] Arranged:  RN, Patient Refused [place] Agency:  NA  Status of Service:  Completed, signed off  If discussed at [place] of Stay Meetings, dates discussed:    Additional Comments:  Bethena Roys, RN 01/10/2018, 10:54 AM

## 2018-01-10 NOTE — Consult Note (Signed)
            [facility] CM Primary Care Navigator  01/10/2018  Alexandria [name] 22-Mar-1946 [id number]   Went to seepatient at the bedside to identify possible discharge needs butshe was alreadydischargedhomeper staff.  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 (DOE- dyspnea on exertion, ILD- interstitial lung disease)  Patient has discharge instruction to follow-up withprimary care provider in 1 day, and pulmonology follow-up on 01/17/18.  Primary care provider's office is listed as providing transition of care (TOC) follow-up.   For additional questions please contact:  Edwena Felty A. Taino Maertens, BSN, RN-BC [facility] PRIMARY CARE Navigator Cell: [phone]

## 2018-01-11 ENCOUNTER — Telehealth: Payer: Self-pay | Admitting: Pulmonary Disease

## 2018-01-11 NOTE — Telephone Encounter (Signed)
Dr. Lake Bells is out of the office until 10.9.19.   BQ please advise on return thank you.

## 2018-01-11 NOTE — Telephone Encounter (Signed)
Please route to provider of the day. I am not familiar with this patient or case.   [name]

## 2018-01-11 NOTE — Telephone Encounter (Signed)
Sent to [place] in error, Tammy please advise, thank you.

## 2018-01-11 NOTE — Telephone Encounter (Signed)
Per TP: I don't see anything about a holter monitor in her chart.  Does she have a cardiologist?  Would recommend she call that office for these questions.  The only in her discharge is mention of a neb machine.  Thanks.

## 2018-01-11 NOTE — Telephone Encounter (Signed)
Called and spoke with patient, she took her holter monitor off and would like to know if she needs to have this placed back on. Since BQ is out of the office [name] please advise.  Patient stated that she did not mean to involve lab questions in this message that was meant for another office.

## 2018-01-11 NOTE — Telephone Encounter (Signed)
Called patient unable to reach left message to give us a call back.

## 2018-01-12 NOTE — Telephone Encounter (Signed)
Attempted to call pt. I did not receive an answer. I have left a message for pt to return our call.  

## 2018-01-13 NOTE — Telephone Encounter (Signed)
Attempted to call pt. I did not receive an answer. I have left a message for pt to return our call.  

## 2018-01-14 ENCOUNTER — Telehealth: Payer: Self-pay | Admitting: Pulmonary Disease

## 2018-01-14 NOTE — Telephone Encounter (Signed)
I don't think it is necessary  Can she just take albuterol?

## 2018-01-14 NOTE — Telephone Encounter (Signed)
I have removed the xopenex neb sol off of pt's med list.

## 2018-01-14 NOTE — Telephone Encounter (Signed)
Attempted to call pt but unable to reach pt. Left message for pt to return call.  Due to multiple attempts trying to reach pt, per triage protocol, encounter will be closed.

## 2018-01-14 NOTE — Telephone Encounter (Signed)
Called and spoke with Patient.  She stated that Dr. Lake Bells had her wearing a holter monitor.  She was admitted in the hospital 10/3-10/7, and has not had the monitor on since being d/c from hospital.  We have not seen anything in her chart re guarding a holter monitor. She was referred to cardiology 12/14/17. Patient stated that she was going to call the number on her holter, to see who she who she should contact.  Will route to Dr. Lake Bells

## 2018-01-17 ENCOUNTER — Encounter: Payer: Self-pay | Admitting: Pulmonary Disease

## 2018-01-17 ENCOUNTER — Encounter: Payer: Medicare Other | Admitting: Family Medicine

## 2018-01-17 ENCOUNTER — Ambulatory Visit (INDEPENDENT_AMBULATORY_CARE_PROVIDER_SITE_OTHER): Payer: Medicare Other | Admitting: Pulmonary Disease

## 2018-01-17 ENCOUNTER — Encounter: Payer: Self-pay | Admitting: Family Medicine

## 2018-01-17 VITALS — BP 128/86 | HR 81 | Ht 64.0 in | Wt 133.0 lb

## 2018-01-17 DIAGNOSIS — R05 Cough: Secondary | ICD-10-CM

## 2018-01-17 DIAGNOSIS — J849 Interstitial pulmonary disease, unspecified: Secondary | ICD-10-CM

## 2018-01-17 DIAGNOSIS — R059 Cough, unspecified: Secondary | ICD-10-CM

## 2018-01-17 MED ORDER — BENZONATATE 100 MG PO CAPS: 100.0000 mg | ORAL_CAPSULE | Freq: Three times a day (TID) | ORAL | 0 refills | Status: DC | PRN

## 2018-01-17 MED ORDER — TRAZODONE HCL 50 MG PO TABS: 50.0000 mg | ORAL_TABLET | Freq: Every evening | ORAL | 0 refills | Status: DC | PRN

## 2018-01-17 NOTE — Progress Notes (Signed)
Synopsis: Former patient of Dr. Melvyn Novas with UACS and asthma who was found on chest imaging to have evidence of diffuse parenchymal lung disease.  She has a history of acid reflux and intolerance to antacids.  She treats her reflux with papaya. She says that she has had recurrent episodes of bronchitis on an annual basis for many years.  Subjective:   PATIENT ID: Alexandria [name] GENDER: female DOB: [birth year], MRN: [id number]   HPI  Chief Complaint  Patient presents with  . Hospitalization Follow-up    Was in the hospital on 10/3. Was advised to be on prednisone by the hospital, was unsure taper.    Alexandria [name] returns to clinic today for follow-up of ongoing cough.  She was hospitalized for worsening bronchitis and shortness of breath recently and was treated with Solu-Medrol and prednisone.  She says that this made her cough go away.  Since then she has stopped taking the methylprednisolone and she says the cough is starting to come back.  She ran out of her cough medicine.  She also says that she continues to take the inhalers but she does not think that it has been helpful.  Past Medical History:  Diagnosis Date  . Arthritis   . Asthmatic bronchitis with acute exacerbation 02/06/2015  . Bronchial pneumonia   . Colon polyp   . Diverticulitis   . DVT (deep venous thrombosis) ([place])    lower extremity  . H/O cardiac catheterization 2004   Normal coronary arteries  . Heart murmur   . History of stress test 05/21/2011  . Hx of echocardiogram 01/27/2010   Normal Ef 55% the transmitral spectral doppler flow pattern is normal for age. the left ventricular wall motion is normal  . Migraines   . Pancreatitis   . Thyroid disease   . TIA (transient ischemic attack)    8 years ago     Review of Systems  Constitutional: Negative for diaphoresis, fever and weight loss.  HENT: Negative for congestion, ear discharge and sinus pain.   Respiratory: Positive for cough. Negative for sputum  production, wheezing and stridor.   Cardiovascular: Negative for chest pain, orthopnea and leg swelling.      Objective:  Physical Exam   Vitals:   01/17/18 1548  BP: 128/86  Pulse: 81  SpO2: 96%  Weight: 133 lb (60.3 kg)  Height: 5\' 4"  (1.626 m)    Gen: well appearing HENT: OP clear, TM's clear, neck supple PULM: Coarse crackles R base, no wheeze, normal percussion CV: RRR, no mgr, trace edema GI: BS+, soft, nontender Derm: no cyanosis or rash Psyche: normal mood and affect    CBC    Component Value Date/Time   WBC 7.1 01/06/2018 1324   RBC 4.26 01/06/2018 1324   HGB 14.0 01/06/2018 1324   HCT 40.1 01/06/2018 1324   PLT 238 01/06/2018 1324   MCV 94.1 01/06/2018 1324   MCH 32.9 01/06/2018 1324   MCHC 34.9 01/06/2018 1324   RDW 12.7 01/06/2018 1324   LYMPHSABS 2.8 01/06/2018 1324   MONOABS 0.9 01/06/2018 1324   EOSABS 0.4 01/06/2018 1324   BASOSABS 0.1 01/06/2018 1324     Chest imaging: September 2019 high-resolution CT scan of the chest considered probable UIP, differential diagnosis includes nonspecific interstitial pneumonitis.  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  ANA negative  Path:  Echo:  Heart  Catheterization:  rx max gerd 02/24/15 and d/c qvar > 85% improved, still some pm coughing > rec add 1st gen H1 at hs > resolved, try off all rx 04/16/2015  - flared mid Feb 2017  and did not resume rx as rec> 06/11/2015 suggested she resume gerd/h1 and f/u prn  - UGI 09/22/16 Pos mild/mod GERD  - FENO 11/30/2016  =   40 during flare off all rx - Allergy profile 11/30/2016 >  Eos 0.3 /  IgE   slightly elevated at 129, RAST profile negative otherwise     Assessment & Plan:   ILD (interstitial lung disease) ([place]) - Plan: ANCA screen with reflex titer  Cough  Discussion: Alexandria [name] has 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  However, it is not clear to me exactly what the etiology is.  Her elevated serum eosinophil count raises concern for eosinophilic pneumonia (however this would be a very unusual radiographic finding of that disease) or a small vessel eosinophilic granulomatous vasculitis.  Because she recently took methylprednisolone I cannot test for that today, though I would like to check blood work for that condition and a bronchoscopy early next week.  Lung function testing did not show evidence of airflow obstruction so I am not sure were going to help her with bronchodilators or inhaled medicines.  I told her she can stop those today.  Plan: Cough: Take Tessalon 200 mg every 8 hours as needed for cough  Diffuse parenchymal lung disease: As we discussed today I am concerned that you have something called usual interstitial pneumonia.  The differential diagnosis includes hypersensitivity pneumonitis, idiopathic pulmonary fibrosis, or much less likely a small vessel eosinophilic vasculitis or eosinophilic pneumonia. Next week after you have  been off of the methylprednisolone for over 1 week I would like to check a blood test called and antineutrophilic cytoplasmic antibody.  This will look for evidence of underlying vasculitis. I would also like to perform a bronchoscopy with bronchioloalveolar lavage next week to send test to see if you have evidence of something called eosinophilic pneumonia. We will plan on a bronchoscopy at [facility] on Monday or Tuesday of next week, we will call you tomorrow to tell you exactly what time and what day.  We will see you back in 2 to 3 weeks or sooner if needed  > 50% of this 40 minute visit spent face to face    Current Outpatient Medications:  .  albuterol (PROVENTIL HFA;VENTOLIN HFA) 108 (90 Base) MCG/ACT inhaler, Inhale 2 puffs into the lungs every 4 (four) hours as needed for wheezing., Disp: , Rfl: 1 .  benzonatate (TESSALON) 100 MG capsule, Take 1 capsule (100 mg total) by mouth  3 (three) times daily as needed for cough., Disp: 20 capsule, Rfl: 0 .  Biotin (RA BIOTIN) 1000 MCG tablet, Take 1,000 mcg by mouth 2 (two) times daily. , Disp: , Rfl:  .  bisacodyl (DULCOLAX) 10 MG suppository, Place 1 suppository (10 mg total) rectally daily as needed for moderate constipation., Disp: 12 suppository, Rfl: 0 .  BREWERS YEAST PO, Take 2 tablets by mouth 2 (two) times daily. , Disp: , Rfl:  .  clopidogrel (PLAVIX) 75 MG tablet, Take 1 tablet (75 mg total) by mouth daily., Disp: 30 tablet, Rfl: 0 .  dicyclomine (BENTYL) 20 MG tablet, TAKE 1 TABLET BY MOUTH FOUR TIMES A DAY AS NEEDED (Patient taking differently: Take 20 mg by mouth 4 (four) times daily as needed for spasms. ), Disp: 360 tablet, Rfl: 0 .  folic acid (FOLVITE) 1 MG tablet, Take 1 tablet (1 mg total) by mouth daily., Disp: 30 tablet, Rfl: 0 .  furosemide (LASIX) 40 MG tablet, Take 1 tablet (40 mg total) by mouth daily., Disp: 30 tablet, Rfl: 0 .  guaiFENesin (MUCINEX) 600 MG 12 hr tablet, Take 2 tablets (1,200 mg total) by  mouth daily., Disp: 30 tablet, Rfl: 0 .  lactulose, encephalopathy, (CHRONULAC) 10 GM/15ML SOLN, TAKE 30 MLS (20 G TOTAL) BY MOUTH EVERY 6 (SIX) HOURS AS NEEDED. (Patient taking differently: Take 20 g by mouth every 6 (six) hours as needed (constipation). ), Disp: 1892 mL, Rfl: 1 .  levothyroxine (SYNTHROID) 112 MCG tablet, Take 1 tablet (112 mcg total) by mouth daily before breakfast., Disp: 30 tablet, Rfl: 0 .  mometasone-formoterol (DULERA) 100-5 MCG/ACT AERO, Inhale 2 puffs into the lungs 2 (two) times daily., Disp: 13 g, Rfl: 0 .  Multiple Vitamin (MULTIVITAMIN) tablet, Take 1 tablet by mouth daily., Disp: , Rfl:  .  OMEGA-3 FATTY ACIDS-VITAMIN E PO, Take 1 capsule by mouth daily., Disp: , Rfl:  .  pantoprazole (PROTONIX) 40 MG tablet, Take 1 tablet (40 mg total) by mouth 2 (two) times daily., Disp: 30 tablet, Rfl: 0 .  polyethylene glycol (MIRALAX / GLYCOLAX) packet, Take 17 g by mouth daily., Disp: 14 each, Rfl: 0 .  potassium chloride (K-DUR,KLOR-CON) 10 MEQ tablet, Take 1 tablet (10 mEq total) by mouth daily., Disp: 30 tablet, Rfl: 0 .  promethazine-codeine (PHENERGAN WITH CODEINE) 6.25-10 MG/5ML syrup, Take 5 mLs by mouth every 6 (six) hours as needed for cough., Disp: 120 mL, Rfl: 0 .  senna-docusate (SENOKOT-S) 8.6-50 MG tablet, Take 2 tablets by mouth at bedtime., Disp: 20 tablet, Rfl: 0 .  traZODone (DESYREL) 50 MG tablet, Take 1 tablet (50 mg total) by mouth at bedtime as needed for sleep., Disp: 10 tablet, Rfl: 0 .  vitamin C (ASCORBIC ACID) 500 MG tablet, Take 1,000-2,000 mg by mouth daily., Disp: , Rfl:  .  vitamin D, CHOLECALCIFEROL, 400 UNITS tablet, Take 400 Units by mouth 2 (two) times daily. , Disp: , Rfl:  .  methylPREDNISolone (MEDROL) 4 MG tablet, Take 1 tablet (4 mg total) by mouth daily before breakfast. (Patient not taking: Reported on 01/17/2018), Disp: 8 tablet, Rfl: 0 .  methylPREDNISolone (MEDROL) 8 MG tablet, Take 1 tablet (8 mg total) by mouth daily. (Patient not  taking: Reported on 01/17/2018), Disp: 2 tablet, Rfl: 0

## 2018-01-17 NOTE — Addendum Note (Signed)
Addended by: Valerie Salts on: 01/17/2018 05:17 PM   Modules accepted: Orders

## 2018-01-17 NOTE — Patient Instructions (Signed)
Cough: Take Tessalon 200 mg every 8 hours as needed for cough  Diffuse parenchymal lung disease: As we discussed today I am concerned that you have something called usual interstitial pneumonia.  The differential diagnosis includes hypersensitivity pneumonitis, idiopathic pulmonary fibrosis, or much less likely a small vessel eosinophilic vasculitis or eosinophilic pneumonia. Next week after you have been off of the methylprednisolone for over 1 week I would like to check a blood test called and antineutrophilic cytoplasmic antibody.  This will look for evidence of underlying vasculitis. I would also like to perform a bronchoscopy with bronchioloalveolar lavage next week to send test to see if you have evidence of something called eosinophilic pneumonia. We will plan on a bronchoscopy at [facility] on Monday or Tuesday of next week, we will call you tomorrow to tell you exactly what time and what day.  We will see you back in 2 to 3 weeks or sooner if needed

## 2018-01-17 NOTE — Telephone Encounter (Signed)
noted 

## 2018-01-18 ENCOUNTER — Encounter: Payer: Self-pay | Admitting: Family Medicine

## 2018-01-18 ENCOUNTER — Ambulatory Visit: Payer: Medicare Other | Admitting: Pulmonary Disease

## 2018-01-18 ENCOUNTER — Ambulatory Visit (INDEPENDENT_AMBULATORY_CARE_PROVIDER_SITE_OTHER): Payer: Medicare Other | Admitting: Family Medicine

## 2018-01-18 VITALS — BP 139/70 | HR 75 | Temp 98.5°F | Resp 16 | Ht 64.0 in | Wt 135.0 lb

## 2018-01-18 DIAGNOSIS — J849 Interstitial pulmonary disease, unspecified: Secondary | ICD-10-CM

## 2018-01-18 DIAGNOSIS — E876 Hypokalemia: Secondary | ICD-10-CM | POA: Diagnosis not present

## 2018-01-18 LAB — BASIC METABOLIC PANEL
BUN: 12 mg/dL (ref 6–23)
CO2: 33 mEq/L — ABNORMAL HIGH (ref 19–32)
Calcium: 9 mg/dL (ref 8.4–10.5)
Chloride: 97 mEq/L (ref 96–112)
Creatinine, Ser: 0.77 mg/dL (ref 0.40–1.20)
GFR: 78.08 mL/min (ref >60.00)
Glucose, Bld: 98 mg/dL (ref 70–99)
POTASSIUM: 3.2 meq/L — AB (ref 3.5–5.1)
SODIUM: 138 meq/L (ref 135–145)

## 2018-01-18 NOTE — Progress Notes (Signed)
Patient ID: Alexandria [name], female    DOB: 15-Nov-1945  Age: 72 y.o. MRN: [id number]    Subjective:  Subjective  HPI Alexandria [name] presents for f/u from hosp for cough / worsening sob.  She was admitted and put on pred / solumedrol.  She saw pulmonary yesterday -- she has dx 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  pulm repeating blood work and doing bronchoscopy next week.  Pt has stopped all her inhalers and is off prednisone now.  She was given tessalon for cough and trazadone for sleep.    Review of Systems  Constitutional: Negative for chills and fever.  HENT: Negative for congestion and hearing loss.   Eyes: Negative for discharge.  Respiratory: Positive for cough. Negative for shortness of breath.   Cardiovascular: Negative for chest pain, palpitations and leg swelling.  Gastrointestinal: Negative for abdominal pain, blood in stool, constipation, diarrhea, nausea and vomiting.  Genitourinary: Negative for dysuria, frequency, hematuria and urgency.  Musculoskeletal: Negative for back pain and myalgias.  Skin: Negative for rash.  Allergic/Immunologic: Negative for environmental allergies.  Neurological: Negative for dizziness, weakness and headaches.  Hematological: Does not bruise/bleed easily.  Psychiatric/Behavioral: Negative for suicidal ideas. The patient is not nervous/anxious.     History Past Medical History:  Diagnosis Date  . Arthritis   . Asthmatic bronchitis with acute exacerbation 02/06/2015  . Bronchial pneumonia   . Colon polyp   . Diverticulitis   . DVT (deep venous thrombosis) ([place])    lower extremity  . H/O cardiac catheterization 2004   Normal coronary arteries  . Heart murmur   . History of stress test 05/21/2011  . Hx of echocardiogram 01/27/2010   Normal Ef 55% the transmitral spectral doppler flow pattern is normal for age. the left ventricular wall motion is normal  . Migraines   . Pancreatitis   . Thyroid disease   .  TIA (transient ischemic attack)    8 years ago    She has a past surgical history that includes Vaginal hysterectomy (10/17/1998); Breast biopsy; Total knee arthroplasty; Neck surgery; Cardiac catheterization; and left heart catheterization with coronary angiogram (N/A, 10/25/2013).   Her family history includes Arthritis in her unknown relative; Colon cancer in her brother and father; Diabetes in her maternal grandmother; HIV in her brother; Heart disease in her maternal grandmother; Hypertension in her maternal grandmother; Kidney cancer in her brother; Lung cancer in her brother; Other in her brother; Ovarian cancer in her mother; Prostate cancer in her father; Stroke in her maternal grandmother; Uterine cancer in her mother.She reports that she quit smoking about 48 years ago. She quit after 2.00 years of use. She has never used smokeless tobacco. She reports that she does not drink alcohol or use drugs.  Current Outpatient Medications on File Prior to Visit  Medication Sig Dispense Refill  . benzonatate (TESSALON) 100 MG capsule Take 1 capsule (100 mg total) by mouth 3 (three) times daily as needed for cough. 45 capsule 0  . Biotin (RA BIOTIN) 1000 MCG tablet Take 1,000 mcg by mouth 2 (two) times daily.     . clopidogrel (PLAVIX) 75 MG tablet Take 1 tablet (75 mg total) by mouth daily. 30 tablet 0  . dicyclomine (BENTYL) 20 MG tablet TAKE 1 TABLET BY MOUTH FOUR TIMES A DAY AS NEEDED (Patient taking differently: Take 20 mg by mouth 4 (four) times daily as needed for spasms. ) 902 tablet 0  . folic acid (FOLVITE) 1 MG tablet  Take 1 tablet (1 mg total) by mouth daily. 30 tablet 0  . furosemide (LASIX) 40 MG tablet Take 1 tablet (40 mg total) by mouth daily. 30 tablet 0  . lactulose, encephalopathy, (CHRONULAC) 10 GM/15ML SOLN TAKE 30 MLS (20 G TOTAL) BY MOUTH EVERY 6 (SIX) HOURS AS NEEDED. (Patient taking differently: Take 20 g by mouth every 6 (six) hours as needed (constipation). ) 1892 mL 1  .  levothyroxine (SYNTHROID) 112 MCG tablet Take 1 tablet (112 mcg total) by mouth daily before breakfast. 30 tablet 0  . mometasone-formoterol (DULERA) 100-5 MCG/ACT AERO Inhale 2 puffs into the lungs 2 (two) times daily. 13 g 0  . Multiple Vitamin (MULTIVITAMIN) tablet Take 1 tablet by mouth daily.    . OMEGA-3 FATTY ACIDS-VITAMIN E PO Take 1 capsule by mouth daily.    . pantoprazole (PROTONIX) 40 MG tablet Take 1 tablet (40 mg total) by mouth 2 (two) times daily. 30 tablet 0  . polyethylene glycol (MIRALAX / GLYCOLAX) packet Take 17 g by mouth daily. 14 each 0  . potassium chloride (K-DUR,KLOR-CON) 10 MEQ tablet Take 1 tablet (10 mEq total) by mouth daily. 30 tablet 0  . traZODone (DESYREL) 50 MG tablet Take 1 tablet (50 mg total) by mouth at bedtime as needed for sleep. 30 tablet 0  . vitamin C (ASCORBIC ACID) 500 MG tablet Take 1,000-2,000 mg by mouth daily.    . vitamin D, CHOLECALCIFEROL, 400 UNITS tablet Take 400 Units by mouth 2 (two) times daily.      No current facility-administered medications on file prior to visit.      Objective:  Objective  Physical Exam  Constitutional: She is oriented to person, place, and time. She appears well-developed and well-nourished.  HENT:  Head: Normocephalic and atraumatic.  Eyes: Conjunctivae and EOM are normal.  Neck: Normal range of motion. Neck supple. No JVD present. Carotid bruit is not present. No thyromegaly present.  Cardiovascular: Normal rate, regular rhythm and normal heart sounds.  No murmur heard. Pulmonary/Chest: Effort normal. No respiratory distress. She has wheezes. She has rales. She exhibits no tenderness.  Exp wheeze throughout + rales   Musculoskeletal: She exhibits no edema.  Neurological: She is alert and oriented to person, place, and time.  Psychiatric: She has a normal mood and affect.  Nursing note and vitals reviewed.  BP 139/70 (BP Location: Right Arm, Cuff Size: Normal)   Pulse 75   Temp 98.5 F (36.9 C)  (Oral)   Resp 16   Ht 5\' 4"  (1.626 m)   Wt 135 lb (61.2 kg)   SpO2 96%   BMI 23.17 kg/m  Wt Readings from Last 3 Encounters:  01/18/18 135 lb (61.2 kg)  01/17/18 133 lb (60.3 kg)  01/17/18 133 lb 12.8 oz (60.7 kg)     Lab Results  Component Value Date   WBC 7.1 01/06/2018   HGB 14.0 01/06/2018   HCT 40.1 01/06/2018   PLT 238 01/06/2018   GLUCOSE 125 (H) 01/10/2018   CHOL 189 11/26/2017   TRIG 51.0 11/26/2017   HDL 61.60 11/26/2017   LDLCALC 117 (H) 11/26/2017   ALT 17 12/14/2017   AST 19 12/14/2017   NA 135 01/10/2018   K 3.4 (L) 01/10/2018   CL 100 01/10/2018   CREATININE 0.78 01/10/2018   BUN 15 01/10/2018   CO2 26 01/10/2018   TSH 0.62 11/26/2017   INR 1.1 (H) 05/07/2015    Ct Angio Chest Pe W And/or Wo Contrast  Result Date: 01/06/2018 CLINICAL DATA:  Cough and shortness of breath for 11 days. EXAM: CT ANGIOGRAPHY CHEST WITH CONTRAST TECHNIQUE: Multidetector CT imaging of the chest was performed using the standard protocol during bolus administration of intravenous contrast. Multiplanar CT image reconstructions and MIPs were obtained to evaluate the vascular anatomy. CONTRAST:  167mL ISOVUE-370 IOPAMIDOL (ISOVUE-370) INJECTION 76% COMPARISON:  CT chest 12/30/2017. FINDINGS: Cardiovascular: Satisfactory opacification of the pulmonary arteries to the segmental level. No evidence of pulmonary embolism. Normal heart size. No pericardial effusion. Aortic atherosclerosis. Aortic valve calcifications. Coronary artery calcification. Ectasia of ascending thoracic aorta. Mediastinum/Nodes: No enlarged mediastinal, hilar, or axillary lymph nodes. Thyroid gland, trachea, and esophagus demonstrate no significant findings. Lungs/Pleura: Multiple small calcified and noncalcified pulmonary nodules are noted, unchanged from recent CT. No acute consolidative airspace disease. Peripheral predominant areas of septal thickening and bronchiectasis. Upper Abdomen: Unremarkable. Musculoskeletal:  No chest wall abnormality. No acute or significant osseous findings. Review of the MIP images confirms the above findings. IMPRESSION: No evidence of PE. No acute consolidative airspace disease. Stable mild interstitial lung disease. 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 Recommend annual imaging followup by CTA or MRA. This recommendation follows 2010 ACCF/AHA/AATS/ACR/ASA/SCA/SCAI/SIR/STS/SVM Guidelines for the Diagnosis and Management of Patients with Thoracic Aortic Disease. Circulation. 2010; 121: Y801-K553. Aortic Atherosclerosis (ICD10-I70.0). Electronically Signed   By: Staci Righter M.D.   On: 01/06/2018 14:31     Assessment & Plan:  Plan  I have discontinued Claris Gower. Pondexter's BREWERS YEAST PO, albuterol, methylPREDNISolone, methylPREDNISolone, bisacodyl, senna-docusate, guaiFENesin, and promethazine-codeine. I am also having her maintain her vitamin D (CHOLECALCIFEROL), multivitamin, Biotin, lactulose (encephalopathy), dicyclomine, vitamin C, OMEGA-3 FATTY ACIDS-VITAMIN E PO, furosemide, levothyroxine, pantoprazole, polyethylene glycol, clopidogrel, folic acid, potassium chloride, mometasone-formoterol, benzonatate, and traZODone.  No orders of the defined types were placed in this encounter.   Problem List Items Addressed This Visit      Unprioritized   Hypokalemia - Primary   Relevant Orders   Basic metabolic panel   Interstitial lung disease ([place])    Per pulm Bronchoscopy and labs early next week Pt refused pneumonia vaccine at this time-- she wants to wait until after her procedure She is refusing flu vaccine          Follow-up: Return in about 6 months (around 07/20/2018).  [name], DO

## 2018-01-18 NOTE — Assessment & Plan Note (Signed)
Per pulm Bronchoscopy and labs early next week Pt refused pneumonia vaccine at this time-- she wants to wait until after her procedure She is refusing flu vaccine

## 2018-01-18 NOTE — Patient Instructions (Signed)

## 2018-01-19 ENCOUNTER — Telehealth: Payer: Self-pay | Admitting: Pulmonary Disease

## 2018-01-19 NOTE — Telephone Encounter (Signed)
Noted, thanks!

## 2018-01-19 NOTE — Telephone Encounter (Signed)
Due to previous encounter being closed, opening a new encounter.  Per Dr. Lake Bells, he said it would not be necessary for a PA to be processed for the Xopenex and he wanted to know if pt could just take the albuterol.  Attempted to call pt but unable to reach her. Left a message for pt to return call so we can get things clarified with neb sol.

## 2018-01-19 NOTE — Telephone Encounter (Signed)
Pt is calling back [phone]

## 2018-01-19 NOTE — Telephone Encounter (Signed)
Attempted to call pt but unable to reach her. Left message for pt to return call. 

## 2018-01-19 NOTE — Telephone Encounter (Signed)
Spoke with pt. At her appointment on 01/17/18, BQ wanted her to be scheduled for a bronch.  Called the respiratory department and spoke with Baxter Flattery. Pt has been scheduled for 01/24/18 at 7:30am at [facility].  Pt is aware of this appointment date, time and location.  Will route to BQ to make him aware.

## 2018-01-20 ENCOUNTER — Other Ambulatory Visit: Payer: Self-pay | Admitting: Family Medicine

## 2018-01-20 DIAGNOSIS — E876 Hypokalemia: Secondary | ICD-10-CM

## 2018-01-20 MED ORDER — POTASSIUM CHLORIDE CRYS ER 20 MEQ PO TBCR: 20.0000 meq | EXTENDED_RELEASE_TABLET | Freq: Two times a day (BID) | ORAL | 2 refills | Status: DC

## 2018-01-21 ENCOUNTER — Telehealth: Payer: Self-pay | Admitting: Pulmonary Disease

## 2018-01-21 NOTE — Telephone Encounter (Addendum)
Spoke with the pt She states that no one from [facility] has called her with bronch instructions for 10/21 I called and spoke with Baxter Flattery  She states that they will be calling the pt today  She did say that she would need to arrive at 6:30 am  I let the pt know this  Nothing further needed

## 2018-01-21 NOTE — Telephone Encounter (Signed)
Called and spoke with pt to see which neb sol she is on.  Pt stated she is currently not on any neb sol.  I stated to her the information in regards to the xopenex that we were not going to do a PA on that due to BQ stating that it was not necessary and he wanted to know if she could just do the albuterol neb instead.  Pt stated that BQ took her off of all neb solutions. I asked pt if she wanted me to send a Rx of the albuterol neb sol to her pharmacy and pt stated she was just going to wait until after her bronchoscopy to see what she found out from the results of that and based on the results, if she needed the neb sol, she would call the office to request a Rx.  Nothing further needed.

## 2018-01-24 ENCOUNTER — Telehealth: Payer: Self-pay

## 2018-01-24 ENCOUNTER — Ambulatory Visit (HOSPITAL_COMMUNITY)
Admission: RE | Admit: 2018-01-24 | Discharge: 2018-01-24 | Disposition: A | Discharge status: Home / Self Care | Payer: Medicare Other | Source: Ambulatory Visit | Attending: Pulmonary Disease | Admitting: Pulmonary Disease

## 2018-01-24 ENCOUNTER — Encounter (HOSPITAL_COMMUNITY)
Admission: RE | Disposition: A | Discharge status: Home / Self Care | Payer: Self-pay | Source: Ambulatory Visit | Attending: Pulmonary Disease

## 2018-01-24 ENCOUNTER — Other Ambulatory Visit (INDEPENDENT_AMBULATORY_CARE_PROVIDER_SITE_OTHER): Payer: Medicare Other

## 2018-01-24 ENCOUNTER — Other Ambulatory Visit: Payer: Self-pay | Admitting: Pulmonary Disease

## 2018-01-24 ENCOUNTER — Encounter (HOSPITAL_COMMUNITY): Payer: Self-pay | Admitting: Respiratory Therapy

## 2018-01-24 DIAGNOSIS — J849 Interstitial pulmonary disease, unspecified: Secondary | ICD-10-CM | POA: Insufficient documentation

## 2018-01-24 DIAGNOSIS — R848 Other abnormal findings in specimens from respiratory organs and thorax: Secondary | ICD-10-CM | POA: Diagnosis not present

## 2018-01-24 HISTORY — PX: VIDEO BRONCHOSCOPY: SHX5072

## 2018-01-24 SURGERY — BRONCHOSCOPY, WITH FLUOROSCOPY
Anesthesia: Moderate Sedation | Laterality: Bilateral

## 2018-01-24 MED ORDER — LIDOCAINE HCL (PF) 1 % IJ SOLN
INTRAMUSCULAR | Status: DC | PRN
  Administered 2018-01-24: 6 mL

## 2018-01-24 MED ORDER — PHENYLEPHRINE HCL 0.25 % NA SOLN
NASAL | Status: DC | PRN
  Administered 2018-01-24: 2 via NASAL

## 2018-01-24 MED ORDER — LIDOCAINE HCL 2 % EX GEL
1.0000 "application " | Freq: Once | CUTANEOUS | Status: DC
  Filled 2018-01-24: qty 5

## 2018-01-24 MED ORDER — FENTANYL CITRATE (PF) 100 MCG/2ML IJ SOLN
INTRAMUSCULAR | Status: DC | PRN
  Administered 2018-01-24: 50 ug via INTRAVENOUS
  Administered 2018-01-24 (×2): 25 ug via INTRAVENOUS

## 2018-01-24 MED ORDER — MIDAZOLAM HCL 10 MG/2ML IJ SOLN
INTRAMUSCULAR | Status: DC | PRN
  Administered 2018-01-24: 1 mg via INTRAVENOUS
  Administered 2018-01-24: 2 mg via INTRAVENOUS
  Administered 2018-01-24: 1 mg via INTRAVENOUS

## 2018-01-24 MED ORDER — MIDAZOLAM HCL 5 MG/ML IJ SOLN
INTRAMUSCULAR | Status: AC
  Filled 2018-01-24: qty 2

## 2018-01-24 MED ORDER — LIDOCAINE HCL URETHRAL/MUCOSAL 2 % EX GEL
CUTANEOUS | Status: DC | PRN
  Administered 2018-01-24: 1

## 2018-01-24 MED ORDER — PHENYLEPHRINE HCL 0.25 % NA SOLN
1.0000 | Freq: Four times a day (QID) | NASAL | Status: DC | PRN
  Filled 2018-01-24: qty 15

## 2018-01-24 MED ORDER — SODIUM CHLORIDE 0.9 % IV SOLN
Freq: Once | INTRAVENOUS | Status: AC
  Administered 2018-01-24: 07:00:00 via INTRAVENOUS

## 2018-01-24 MED ORDER — FENTANYL CITRATE (PF) 100 MCG/2ML IJ SOLN
INTRAMUSCULAR | Status: AC
  Filled 2018-01-24: qty 4

## 2018-01-24 MED ORDER — BUTAMBEN-TETRACAINE-BENZOCAINE 2-2-14 % EX AERO: 1.0000 | INHALATION_SPRAY | Freq: Once | CUTANEOUS | Status: DC

## 2018-01-24 NOTE — Progress Notes (Signed)
Video bronchoscopy performed.  Intervention bronchial washing.   No complications noted.  Will continue to monitor. 

## 2018-01-24 NOTE — Telephone Encounter (Signed)
Attempted to call patient to schedule lab draw at [facility]. Place the order for the lab but still will need to put her on the lab schedule. Patient did not answer, left message for patient to call back.

## 2018-01-24 NOTE — Discharge Instructions (Signed)
Flexible Bronchoscopy, Care After These instructions give you information on caring for yourself after your procedure. Your doctor may also give you more specific instructions. Call your doctor if you have any problems or questions after your procedure. Follow these instructions at home:  Do not eat or drink anything for 2 hours after your procedure. If you try to eat or drink before the medicine wears off, food or drink could go into your lungs. You could also burn yourself.  After 2 hours have passed and when you can cough and gag normally, you may eat soft food and drink liquids slowly.  The day after the test, you may eat your normal diet.  You may do your normal activities.  Keep all doctor visits. Get help right away if:  You get more and more short of breath.  You get light-headed.  You feel like you are going to pass out (faint).  You have chest pain.  You have new problems that worry you.  You cough up more than a little blood.  You cough up more blood than before.  Do not eat or drink anything until 10:15 am on 01/24/2018. This information is not intended to replace advice given to you by your health care provider. Make sure you discuss any questions you have with your health care provider. Document Released: 01/18/2009 Document Revised: 08/29/2015 Document Reviewed: 11/25/2012 Elsevier Interactive Patient Education  2017 [employer].

## 2018-01-24 NOTE — Telephone Encounter (Signed)
-----   Message from [name], MD sent at 01/24/2018  8:02 AM EDT ----- Hi,  Please arrange an ANCA blood test for this week at [facility]. Reason ILD  Thanks Ruby Cola

## 2018-01-24 NOTE — H&P (Signed)
[facility]  HPI: 72 y/o female with diffuse parenchymal lung disease, possible asthma and a syndrome of cough which is steroid responsive came to me in clinic about a month ago complaining of the same.  Since that time she was admitted to the hospital for further evaluation.  She improved with steroids which have since been held.  She is here today for a bronchoscopy to further evaluate her underlying lung disease.  Past Medical History:  Diagnosis Date  . Arthritis   . Asthmatic bronchitis with acute exacerbation 02/06/2015  . Bronchial pneumonia   . Colon polyp   . Diverticulitis   . DVT (deep venous thrombosis) ([place])    lower extremity  . H/O cardiac catheterization 2004   Normal coronary arteries  . Heart murmur   . History of stress test 05/21/2011  . Hx of echocardiogram 01/27/2010   Normal Ef 55% the transmitral spectral doppler flow pattern is normal for age. the left ventricular wall motion is normal  . Migraines   . Pancreatitis   . Thyroid disease   . TIA (transient ischemic attack)    8 years ago     Family History  Problem Relation Age of Onset  . Arthritis Unknown   . Colon cancer Father   . Prostate cancer Father   . HIV Brother        2  . Kidney cancer Brother   . Colon cancer Brother   . Lung cancer Brother   . Other Brother        Mouth Cancer  . Ovarian cancer Mother   . Uterine cancer Mother   . Heart disease Maternal Grandmother   . Stroke Maternal Grandmother   . Hypertension Maternal Grandmother   . Diabetes Maternal Grandmother      Social History   Socioeconomic History  . Marital status: Married    Spouse name: [name]  . Number of children: 3  . Years of education: [employer]  . Highest education level: Not on file  Occupational History  . Occupation: DISABLED    Employer: UNEMPLOYED  Social Needs  . Financial resource strain: Not on file  . Food insecurity:    Worry: Not on file    Inability: Not on file  . Transportation needs:     Medical: Not on file    Non-medical: Not on file  Tobacco Use  . Smoking status: Former Smoker    Years: 2.00    Last attempt to quit: 04/06/1969    Years since quitting: 48.8  . Smokeless tobacco: Never Used  . Tobacco comment: socially smoked x 2 years  Substance and Sexual Activity  . Alcohol use: No    Alcohol/week: 0.0 standard drinks  . Drug use: No  . Sexual activity: Not on file  Lifestyle  . Physical activity:    Days per week: Not on file    Minutes per session: Not on file  . Stress: Not on file  Relationships  . Social connections:    Talks on phone: Not on file    Gets together: Not on file    Attends religious service: Not on file    Active member of club or organization: Not on file    Attends meetings of clubs or organizations: Not on file    Relationship status: Not on file  . Intimate partner violence:    Fear of current or ex partner: Not on file    Emotionally abused: Not on file  Physically abused: Not on file    Forced sexual activity: Not on file  Other Topics Concern  . Not on file  Social History Narrative   Lives with husband [name]   Caffeine use: coffee (2 cups per day)   Mostly right-handed     Allergies  Allergen Reactions  . Prednisone Other (See Comments)    Changed personality   . Atrovent Hfa [Ipratropium Bromide Hfa] Other (See Comments)    Pt could not sleep  . Cheratussin Ac [Guaifenesin-Codeine]   . Codeine Other (See Comments)    Pt takes promethazine with codeine at home  . Hydrocodone Itching  . Influenza Vaccines Other (See Comments)    Pt reports heart attack after last flu shot  . Motrin [Ibuprofen] Other (See Comments)    "Gives false reading in blood"     _0 @ Vitals:   01/24/18 0707 01/24/18 0710 01/24/18 0715  BP:  (!) 160/65 (!) 148/71  Pulse: 68 67 63  Resp: (!) _1 Temp: 98.9 F (37.2 C)    TempSrc: Oral    SpO2: 98% 98% 99%  Weight: 60.8 kg    Height: _2  (1.626 m)      RA  Gen: well appearing HENT: OP clear, TM's clear, neck supple PULM: Crackles bases B, normal percussion CV: RRR, no mgr, trace edema GI: BS+, soft, nontender Derm: no cyanosis or rash Psyche: normal mood and affect   CBC    Component Value Date/Time   WBC 7.1 01/06/2018 1324   RBC 4.26 01/06/2018 1324   HGB 14.0 01/06/2018 1324   HCT 40.1 01/06/2018 1324   PLT 238 01/06/2018 1324   MCV 94.1 01/06/2018 1324   MCH 32.9 01/06/2018 1324   MCHC 34.9 01/06/2018 1324   RDW 12.7 01/06/2018 1324   LYMPHSABS 2.8 01/06/2018 1324   MONOABS 0.9 01/06/2018 1324   EOSABS 0.4 01/06/2018 1324   BASOSABS 0.1 01/06/2018 1324    Impression/plan:  72 y/o female with poorly defined diffuse parenchymal lung disease.  This may be IPF, but the steroid responsiveness and eosinphilia is not characteristic of that disease. -bronchoscopy with BAL  Roselie Awkward, MD [place] [facility] Pager: [phone] Cell: [phone] After 3pm or if no response, call [phone]

## 2018-01-24 NOTE — Telephone Encounter (Signed)
Pt called back, placed on [facility] lab schedule for anca.  Nothing further needed.

## 2018-01-24 NOTE — Op Note (Signed)
[facility] Cardiopulmonary Patient Name: Alexandria [name] Date: 01/24/2018 MRN: [id number] Attending MD: [name] , MD Date of Birth: [birth year] CSN: Finalized Age: 72 Admit Type: Outpatient Gender: Female Procedure:            Bronchoscopy Indications:          Interstitial lung disease Providers:            Nathaneil Canary B. , MD, Ashley Mariner RRT,RCP, Phillis Knack RRT, RCP Referring MD:          Medicines:            Fentanyl 100 mcg IV, Lidocaine applied to nares and                        subglottic space, Midazolam 4 mg IV Complications:        No immediate complications Estimated Blood Loss: Estimated blood loss: none. Procedure:            Pre-Anesthesia Assessment:                       - A History and Physical has been performed. Patient                        meds and allergies have been reviewed. The risks and                        benefits of the procedure and the sedation options and                        risks were discussed with the patient. All questions                        were answered and informed consent was obtained.                        Patient identification and proposed procedure were                        verified prior to the procedure by the physician and                        the technician in the procedure room. Mental Status                        Examination: normal. Respiratory Examination: bibasilar                        crackles. CV Examination: normal. ASA Grade Assessment:                        II - A patient with mild systemic disease. After                        reviewing the risks and benefits, the patient was                        deemed in satisfactory condition to undergo the  procedure. The anesthesia plan was to use moderate                        sedation / analgesia (conscious sedation). Immediately                        prior to  administration of medications, the patient was                        re-assessed for adequacy to receive sedatives. The                        heart rate, respiratory rate, oxygen saturations, blood                        pressure, adequacy of pulmonary ventilation, and                        response to care were monitored throughout the                        procedure. The physical status of the patient was                        re-assessed after the procedure.                       After obtaining informed consent, the bronchoscope was                        passed under direct vision. Throughout the procedure,                        the patient's blood pressure, pulse, and oxygen                        saturations were monitored continuously. the BF-H190                        ([id number]) Olympus Diagnostic Bronchoscope was                        introduced through the right nostril and advanced to                        the tracheobronchial tree. The procedure was                        accomplished without difficulty. The patient tolerated                        the procedure well. The total duration of the procedure                        was 4 minutes. Scope In: 8:08:04 AM Scope Out: 8:11:06 AM Findings:      The nasopharynx/oropharynx appears normal. The larynx appears normal.       The vocal cords appear normal. The subglottic space is normal. The       trachea is of normal caliber. The carina is sharp. The tracheobronchial  tree was examined to at least the first subsegmental level. Bronchial       mucosa and anatomy are normal; there are no endobronchial lesions, and       no secretions.      Bronchoalveolar lavage was performed in the right middle lobe of the       lung and sent for cell count, bacterial culture, viral smears & culture,       and fungal & AFB analysis and cytology. 60 mL of fluid were instilled.       20 mL were returned. The return was cloudy. There  were no mucoid plugs       in the return fluid. Impression:           - Interstitial lung disease                       - The airway examination was normal.                       - Bronchoalveolar lavage was performed. Moderate Sedation:      Moderate (conscious) sedation was personally administered by the       pulmonologist. The following parameters were monitored: oxygen       saturation, heart rate, blood pressure, and response to care. Total       physician intraservice time was 12 minutes. Recommendation:       - Await BAL results. Procedure Code(s):    --- Professional ---                       [phone], Bronchoscopy, rigid or flexible, including                        fluoroscopic guidance, when performed; with bronchial                        alveolar lavage Diagnosis Code(s):    --- Professional ---                       J84.9, Interstitial pulmonary disease, unspecified CPT copyright 2018 American Medical Association. All rights reserved. The codes documented in this report are preliminary and upon coder review may  be revised to meet current compliance requirements. Norlene Campbell, MD [name], MD 01/24/2018 8:17:19 AM This report has been signed electronically. Number of Addenda: 0

## 2018-01-25 ENCOUNTER — Encounter (HOSPITAL_COMMUNITY): Payer: Self-pay | Admitting: Pulmonary Disease

## 2018-01-25 LAB — BODY FLUID CELL COUNT WITH DIFFERENTIAL: Total Nucleated Cell Count, Fluid: 8 cu mm (ref 0–1000)

## 2018-01-25 LAB — ANCA SCREEN W REFLEX TITER: ANCA Screen: NEGATIVE

## 2018-01-26 ENCOUNTER — Telehealth: Payer: Self-pay | Admitting: Pulmonary Disease

## 2018-01-26 ENCOUNTER — Other Ambulatory Visit: Payer: Self-pay | Admitting: *Deleted

## 2018-01-26 DIAGNOSIS — E876 Hypokalemia: Secondary | ICD-10-CM

## 2018-01-26 LAB — CULTURE, BAL-QUANTITATIVE: GRAM STAIN: NONE SEEN

## 2018-01-26 LAB — CULTURE, BAL-QUANTITATIVE W GRAM STAIN: Culture: 20000 — AB

## 2018-01-26 LAB — PATHOLOGIST SMEAR REVIEW

## 2018-01-26 NOTE — Telephone Encounter (Signed)
Called and spoke with pt letting her know that there is a form that she needs to sign for patient assistance for the [place]. Pt stated she would be by office tomorrow, 10/24 to sign the form.  Dr. Lake Bells, please advise how soon you can come by the office to sign two different patients' OFEV enrollment applications so we can get these faxed in for pt so they can get started on the medication.  I will have the applications back in triage with me.

## 2018-01-26 NOTE — Telephone Encounter (Signed)
Noted. We will await a return call from pt.

## 2018-01-26 NOTE — Telephone Encounter (Signed)
I spoke to Alexandria [name] to let her know the results of the bronchoscopy.  She has IPF.  She needs to start a medicine called Ofev 150mg  po bid and be referred to the PFF support group.  She voiced understanding.  Will cc the office to start process for Ofev.

## 2018-01-26 NOTE — Telephone Encounter (Signed)
I tried calling Ms. Hanning to let her know that her bronchoscopy results are back and I think she needs to be on a medicine called Ofev.  I had to leave a message asking her to call back to let our triage team when I could speak with her.

## 2018-01-27 ENCOUNTER — Telehealth: Payer: Self-pay | Admitting: Family Medicine

## 2018-01-27 ENCOUNTER — Telehealth: Payer: Self-pay | Admitting: Pulmonary Disease

## 2018-01-27 NOTE — Telephone Encounter (Signed)
See most recent open encounter

## 2018-01-27 NOTE — Telephone Encounter (Signed)
Spoke with Burman Nieves and was advised BQ will sign the Ofev form next week when he is in office.  LMTCB for pt to inform.

## 2018-01-27 NOTE — Telephone Encounter (Signed)
Once Dr. Lake Bells returns back to the office.

## 2018-01-27 NOTE — Telephone Encounter (Signed)
Spoke with pt--- she already has an appointment at [facility] in Dec for an opinion and she is working on an appointment with [employer].

## 2018-01-27 NOTE — Telephone Encounter (Signed)
Patient returned [place] phone call.  Call back number is [phone].

## 2018-01-27 NOTE — Telephone Encounter (Signed)
Copied from [place] [id number]. Topic: Quick Communication - See Telephone Encounter >> Jan 27, 2018  8:39 AM Ahmed Prima L wrote: CRM for notification. See Telephone encounter for: 01/27/18.  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 She said that eventually she is going to die. She said she would like Dr Carollee Herter to call her back, please. She is going today to sign forms to take a medication that helps her live longer. Patient seems very upset.

## 2018-01-27 NOTE — Telephone Encounter (Signed)
Called and spoke to pt. Informed her that the form will be signed when BQ returns to office next week. Pt verbalized understanding. Will forward to Burman Nieves to follow.

## 2018-01-28 ENCOUNTER — Telehealth: Payer: Self-pay | Admitting: Pulmonary Disease

## 2018-01-28 LAB — ACID FAST SMEAR (AFB): ACID FAST SMEAR - AFSCU2: NEGATIVE

## 2018-01-28 LAB — ACID FAST SMEAR (AFB, MYCOBACTERIA)

## 2018-01-28 NOTE — Telephone Encounter (Signed)
Patient was in lobby requesting a copy of ofev form for the open doors program she signed. Patient stated that she is very upset that she was given results over the phone and told she will die. Patient stated that she was told by MD that she will be able to have an appointment to go over these results next week. Patient scheduled for appointment in [facility] office for 10/29 at 11. Patient wanted to take Ofev packed with her for Dr. Lake Bells could sign it and it be submitted but assured her that he will sign it whenever he is back in [place] office, and will be faxed at that time. Nothing further needed at this time.  Routing to Dr. Lake Bells as Juluis Rainier.

## 2018-01-28 NOTE — Telephone Encounter (Signed)
noted 

## 2018-01-31 ENCOUNTER — Telehealth: Payer: Self-pay | Admitting: Pulmonary Disease

## 2018-01-31 ENCOUNTER — Ambulatory Visit (HOSPITAL_BASED_OUTPATIENT_CLINIC_OR_DEPARTMENT_OTHER)
Admission: RE | Admit: 2018-01-31 | Discharge: 2018-01-31 | Disposition: A | Discharge status: Home / Self Care | Payer: Medicare Other | Source: Ambulatory Visit | Attending: Pulmonary Disease | Admitting: Pulmonary Disease

## 2018-01-31 DIAGNOSIS — M549 Dorsalgia, unspecified: Secondary | ICD-10-CM

## 2018-01-31 DIAGNOSIS — S299XXA Unspecified injury of thorax, initial encounter: Secondary | ICD-10-CM | POA: Diagnosis not present

## 2018-01-31 DIAGNOSIS — S3992XA Unspecified injury of lower back, initial encounter: Principal | ICD-10-CM

## 2018-01-31 NOTE — Telephone Encounter (Signed)
BQ made aware. Appt made. Nothing further is needed at this time.

## 2018-01-31 NOTE — Telephone Encounter (Signed)
Patient states that someone called her and left a VM after she spoke with Daneil Dan this afternoon.  She has appt with BQ tomorrow, 02/01/18, in [facility] office and she is wanting to make sure that the Ofev paperwork will be at the [facility] office for her to get at the time of her appt.  CB is [phone].

## 2018-01-31 NOTE — Telephone Encounter (Signed)
Left detailed message for patient stating that we have the Ofev paperwork and I will personally give the forms to BQ tomorrow in [facility].

## 2018-01-31 NOTE — Telephone Encounter (Signed)
Pt called back stating she called her PCP and they advised for pulmonary to order the xray. Spoke with Wyn Quaker, NP (APP of day), and was advised to order xray for her back. Order placed for [facility]. Pt verbalized understanding and denied any further questions or concerns at this time.
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
## 2018-01-31 NOTE — Addendum Note (Signed)
Addended by: Collier Salina on: 01/31/2018 01:47 PM   Modules accepted: Orders

## 2018-01-31 NOTE — Telephone Encounter (Signed)
Called and spoke to patient, patient states she will address everything at her OV in the morning. Nothing further is needed at this time.

## 2018-02-01 ENCOUNTER — Ambulatory Visit (INDEPENDENT_AMBULATORY_CARE_PROVIDER_SITE_OTHER): Payer: Medicare Other | Admitting: Pulmonary Disease

## 2018-02-01 ENCOUNTER — Encounter: Payer: Self-pay | Admitting: Pulmonary Disease

## 2018-02-01 ENCOUNTER — Telehealth: Payer: Self-pay

## 2018-02-01 VITALS — BP 161/88 | HR 84 | Ht 64.0 in | Wt 131.0 lb

## 2018-02-01 DIAGNOSIS — J84112 Idiopathic pulmonary fibrosis: Secondary | ICD-10-CM | POA: Diagnosis not present

## 2018-02-01 DIAGNOSIS — K219 Gastro-esophageal reflux disease without esophagitis: Secondary | ICD-10-CM | POA: Diagnosis not present

## 2018-02-01 NOTE — Progress Notes (Signed)
Synopsis: Former patient of Dr. Melvyn Novas with UACS and asthma who was found on chest imaging to have evidence of diffuse parenchymal lung disease.  She has a history of acid reflux and intolerance to antacids.  She treats her reflux with papaya. She says that she has had recurrent episodes of bronchitis on an annual basis for many years.  Subjective:   PATIENT ID: Alexandria [name] GENDER: female DOB: [birth year], MRN: [id number]   HPI  Chief Complaint  Patient presents with  . Follow-up    Patient is here to follow up on ILD.    Alexandria [name] returns to clinic today for discussion of her new diagnosis of idiopathic pulmonary fibrosis.  She says that her cough has improved since she has been taking Tessalon.  She has been taking papaya 3 times a day and she says that she does not have problems with gastroesophageal reflux disease symptoms such as heartburn or indigestion right now.  In general she feels well but she is very nervous about her diagnosis.   Past Medical History:  Diagnosis Date  . Arthritis   . Asthmatic bronchitis with acute exacerbation 02/06/2015  . Bronchial pneumonia   . Colon polyp   . Diverticulitis   . DVT (deep venous thrombosis) ([place])    lower extremity  . H/O cardiac catheterization 2004   Normal coronary arteries  . Heart murmur   . History of stress test 05/21/2011  . Hx of echocardiogram 01/27/2010   Normal Ef 55% the transmitral spectral doppler flow pattern is normal for age. the left ventricular wall motion is normal  . Migraines   . Pancreatitis   . Thyroid disease   . TIA (transient ischemic attack)    8 years ago     Review of Systems  Constitutional: Negative for diaphoresis, fever and weight loss.  HENT: Negative for congestion, ear discharge and sinus pain.   Respiratory: Positive for cough. Negative for sputum production, wheezing and stridor.   Cardiovascular: Negative for chest pain, orthopnea and leg swelling.      Objective:    Physical Exam   Vitals:   02/01/18 1051  BP: (!) 161/88  Pulse: 84  SpO2: 97%  Weight: 131 lb (59.4 kg)  Height: 5\' 4"  (1.626 m)    Gen: disshevled appearing HENT: OP clear, TM's clear, neck supple PULM: Crackles R base B, normal percussion CV: RRR, no mgr, trace edema GI: BS+, soft, nontender Derm: no cyanosis or rash Psyche: anxious mood and affect    CBC    Component Value Date/Time   WBC 7.1 01/06/2018 1324   RBC 4.26 01/06/2018 1324   HGB 14.0 01/06/2018 1324   HCT 40.1 01/06/2018 1324   PLT 238 01/06/2018 1324   MCV 94.1 01/06/2018 1324   MCH 32.9 01/06/2018 1324   MCHC 34.9 01/06/2018 1324   RDW 12.7 01/06/2018 1324   LYMPHSABS 2.8 01/06/2018 1324   MONOABS 0.9 01/06/2018 1324   EOSABS 0.4 01/06/2018 1324   BASOSABS 0.1 01/06/2018 1324     Chest imaging: September 2019 high-resolution CT scan of the chest considered probable UIP, differential diagnosis includes nonspecific interstitial pneumonitis.  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  ANA negative 01/2018 CBC with diff 400 eosinophils, ANCA test negative October 2019 bronchoscopy: Minimal  white blood cells on cell count and differential, cytology showed minimal inflammatory change, just typical macrophages.  No malignant change.  Cultures negative   Path:  Echo:  Heart Catheterization:  FENO - FENO 11/30/2016  =   40 during flare off all rx       Assessment & Plan:   IPF (idiopathic pulmonary fibrosis) (HCC)  Gastroesophageal reflux disease, esophagitis presence not specified  Discussion: Alexandria [name] has a high-resolution CT scan of the chest which shows findings of probable UIP, she has no evidence of an underlying connective tissue disease, and her bronchoscopy showed no evidence of eosinophilic pneumonia despite the elevated serum eosinophil count.  Based on ATS guidelines she has IPF.  We talked about this at length today.  I advised her that this condition worsens in the medications only slow the progression but do not actually treat the problem.  Also advised of the side effects of anti-fibrotic therapy and that it involves close monitoring of blood work to look for evidence of toxicity.  I also counseled her that she will need to have lung function testing and 6-minute walk testing performed on a serial basis to monitor disease activity.  Greater than 50% of this 30-minute visit was spent face-to-face in direct counseling.  Idiopathic pulmonary fibrosis: Start taking Ofev 150 mg twice a day Watch out for diarrhea, upset stomach while taking this medicine.  Call us if you experience this so we can give you further advice on this We will have our office provide you with information about the local support group for idiopathic pulmonary fibrosis We will need to monitor your liver function when she start taking this medicine, we will check that on the next visit We will need to check a lung function test in 6 months We will make arrangements for a 6-minute walk test when we see you next  Anxiety/stress: I think it is very important for you to start exercising I  recommend slow walking for 20 to 30 minutes  We will see you back in 4 weeks or sooner if needed   Current Outpatient Medications:  .  benzonatate (TESSALON) 100 MG capsule, Take 1 capsule (100 mg total) by mouth 3 (three) times daily as needed for cough., Disp: 45 capsule, Rfl: 0 .  Biotin (RA BIOTIN) 1000 MCG tablet, Take 1,000 mcg by mouth 2 (two) times daily. , Disp: , Rfl:  .  clopidogrel (PLAVIX) 75 MG tablet, Take 1 tablet (75 mg total) by mouth daily., Disp: 30 tablet, Rfl: 0 .  dicyclomine (BENTYL) 20 MG tablet, TAKE 1 TABLET BY MOUTH FOUR TIMES A DAY AS NEEDED (Patient taking differently: Take 20 mg by mouth 4 (four) times daily as needed for spasms. ), Disp: 360 tablet, Rfl: 0 .  folic acid (FOLVITE) 1 MG tablet, Take 1 tablet (1 mg total) by mouth daily., Disp: 30 tablet, Rfl: 0 .  furosemide (LASIX) 40 MG tablet, Take 1 tablet (40 mg  total) by mouth daily. (Patient taking differently: Take 40 mg by mouth 2 (two) times daily. ), Disp: 30 tablet, Rfl: 0 .  lactulose, encephalopathy, (CHRONULAC) 10 GM/15ML SOLN, TAKE 30 MLS (20 G TOTAL) BY MOUTH EVERY 6 (SIX) HOURS AS NEEDED., Disp: 1892 mL, Rfl: 1 .  levothyroxine (SYNTHROID) 112 MCG tablet, Take 1 tablet (112 mcg total) by mouth daily before breakfast., Disp: 30 tablet, Rfl: 0 .  mometasone-formoterol (DULERA) 100-5 MCG/ACT AERO, Inhale 2 puffs into the lungs 2 (two) times daily., Disp: 13 g, Rfl: 0 .  Multiple Vitamin (MULTIVITAMIN) tablet, Take 1 tablet by mouth daily. Gluten free/Vegetarian Multivitamin, Disp: , Rfl:  .  Omega-3 Fatty Acids (OMEGA 3 PO), Take 1 capsule by mouth every other day., Disp: , Rfl:  .  pantoprazole (PROTONIX) 40 MG tablet, Take 1 tablet (40 mg total) by mouth 2 (two) times daily., Disp: 30 tablet, Rfl: 0 .  PAPAYA ENZYME PO, Take 1 capsule by mouth 2 (two) times daily., Disp: , Rfl:  .  polyethylene glycol (MIRALAX / GLYCOLAX) packet, Take 17 g by mouth daily. (Patient taking differently: Take 17 g by  mouth at bedtime. ), Disp: 14 each, Rfl: 0 .  potassium chloride (K-DUR,KLOR-CON) 10 MEQ tablet, Take 1 tablet (10 mEq total) by mouth daily., Disp: 30 tablet, Rfl: 0 .  traZODone (DESYREL) 50 MG tablet, Take 1 tablet (50 mg total) by mouth at bedtime as needed for sleep., Disp: 30 tablet, Rfl: 0 .  vitamin C (ASCORBIC ACID) 500 MG tablet, Take 1,000 mg by mouth 2 (two) times daily. , Disp: , Rfl:  .  vitamin E 1000 UNIT capsule, Take 1,000 Units by mouth daily., Disp: , Rfl:

## 2018-02-01 NOTE — Patient Instructions (Signed)
Idiopathic pulmonary fibrosis: Start taking Ofev 150 mg twice a day Watch out for diarrhea, upset stomach while taking this medicine.  Call us if you experience this so we can give you further advice on this We will have our office provide you with information about the local support group for idiopathic pulmonary fibrosis We will need to monitor your liver function when she start taking this medicine, we will check that on the next visit We will need to check a lung function test in 6 months We will make arrangements for a 6-minute walk test when we see you next  Anxiety/stress: I think it is very important for you to start exercising I recommend slow walking for 20 to 30 minutes  We will see you back in 4 weeks or sooner if needed

## 2018-02-01 NOTE — Telephone Encounter (Signed)
Patient had a office visit today, notified of results. Nothing further is needed at this time.

## 2018-02-02 ENCOUNTER — Other Ambulatory Visit: Payer: Self-pay | Admitting: Pulmonary Disease

## 2018-02-02 NOTE — Telephone Encounter (Signed)
Forms completed and faxed. Nothing further needed.

## 2018-02-03 ENCOUNTER — Telehealth: Payer: Self-pay | Admitting: Pulmonary Disease

## 2018-02-03 MED ORDER — BENZONATATE 100 MG PO CAPS: 100.0000 mg | ORAL_CAPSULE | Freq: Three times a day (TID) | ORAL | 2 refills | Status: DC | PRN

## 2018-02-03 NOTE — Telephone Encounter (Signed)
Spoke with patient. She stated that the Tessalon pearles that had been called in were for 15 days instead of 30 days. Apologized to patient. Correct order was sent to the pharmacy. Patient is aware. Nothing further was needed at time of call.

## 2018-02-03 NOTE — Telephone Encounter (Signed)
Spoke with pt. She is requesting a refill on Tessalon. This has been sent to her pharmacy. Nothing further was needed.

## 2018-02-04 ENCOUNTER — Telehealth: Payer: Self-pay | Admitting: Pulmonary Disease

## 2018-02-04 NOTE — Telephone Encounter (Signed)
Called and spoke with [place].  PA for Ofev has been initiated through [facility].  CMM key is [id number].   Will route to Burman Nieves to follow up

## 2018-02-07 ENCOUNTER — Telehealth: Payer: Self-pay | Admitting: Pulmonary Disease

## 2018-02-07 NOTE — Telephone Encounter (Signed)
Per 11/1 phone note a PA has already been completed and approved through 03/2019.  Spoke with Belenda Cruise at [employer], who verified that the claim ran through as approved with a $25 copay.  Called pt to make aware.  Nothing further needed at this time.

## 2018-02-07 NOTE — Telephone Encounter (Signed)
Checked on status of PA. Clinical questions were answered and sent back via [email or web address]. We should hear a decision within the next 1-3 business days via a phone call and/or fax.

## 2018-02-07 NOTE — Telephone Encounter (Signed)
PA was approved for Ofev from 12/02/17 to 04/05/19. Pharmacy is aware. Will close this encounter.

## 2018-02-08 ENCOUNTER — Other Ambulatory Visit: Payer: Self-pay | Admitting: Family Medicine

## 2018-02-08 DIAGNOSIS — Z79899 Other long term (current) drug therapy: Secondary | ICD-10-CM

## 2018-02-08 NOTE — Telephone Encounter (Signed)
Alexandria [name] is calling to see if Dr Carollee Herter can have her liver checked tomorrow when she comes for lab work. She is going to be starting on OEFV for her IPF.

## 2018-02-08 NOTE — Telephone Encounter (Signed)
Please advise 

## 2018-02-08 NOTE — Telephone Encounter (Signed)
Order is in.

## 2018-02-09 ENCOUNTER — Ambulatory Visit: Payer: BLUE CROSS/BLUE SHIELD | Admitting: Pulmonary Disease

## 2018-02-09 ENCOUNTER — Other Ambulatory Visit (INDEPENDENT_AMBULATORY_CARE_PROVIDER_SITE_OTHER): Payer: Medicare Other

## 2018-02-09 DIAGNOSIS — Z79899 Other long term (current) drug therapy: Secondary | ICD-10-CM | POA: Diagnosis not present

## 2018-02-09 LAB — COMPREHENSIVE METABOLIC PANEL
ALT: 18 U/L (ref 0–35)
AST: 22 U/L (ref 0–37)
Albumin: 3.8 g/dL (ref 3.5–5.2)
Alkaline Phosphatase: 78 U/L (ref 39–117)
BUN: 11 mg/dL (ref 6–23)
CHLORIDE: 102 meq/L (ref 96–112)
CO2: 31 mEq/L (ref 19–32)
Calcium: 9.3 mg/dL (ref 8.4–10.5)
Creatinine, Ser: 0.7 mg/dL (ref 0.40–1.20)
GFR: 87.14 mL/min (ref >60.00)
Glucose, Bld: 91 mg/dL (ref 70–99)
POTASSIUM: 3.5 meq/L (ref 3.5–5.1)
SODIUM: 141 meq/L (ref 135–145)
TOTAL PROTEIN: 6.2 g/dL (ref 6.0–8.3)
Total Bilirubin: 0.6 mg/dL (ref 0.2–1.2)

## 2018-02-12 ENCOUNTER — Other Ambulatory Visit: Payer: Self-pay | Admitting: Pulmonary Disease

## 2018-02-14 ENCOUNTER — Telehealth: Payer: Self-pay | Admitting: Family Medicine

## 2018-02-14 NOTE — Telephone Encounter (Signed)
Spoke with pt. States diagnosis is IPF (idiopathic pulmonary fibrosis). She was started on "Oefv" twice a day, a new medication to treat this condition. Pt states she has had sinus drainage and slight sore throat since yesterday. Pt has taken extra vitamin C and would like to know what else she could take for her symptoms that will not interfere with the new drug.  Please advise?

## 2018-02-14 NOTE — Telephone Encounter (Signed)
Copied from [place] [id number]. Topic: Quick Communication - See Telephone Encounter >> Feb 14, 2018  4:25 PM Antonieta Iba C wrote: CRM for notification. See Telephone encounter for: 02/14/18.  Pt called in to be advised. Pt says that she has been Dx w/ IPS and is taking a medication that slows it down. Pt is requesting a call back to be advised further. Pt says that she is having cold like symptoms and would like to be sure of what medication she could take that will mix well with medication that she is taking for condition?   Please advise.

## 2018-02-14 NOTE — Telephone Encounter (Signed)
She should be able to take antihistamine otc and  flonase if needed

## 2018-02-15 ENCOUNTER — Telehealth: Payer: Self-pay | Admitting: Pulmonary Disease

## 2018-02-15 NOTE — Telephone Encounter (Signed)
If she feels like she is getting sick with pneumonia she needs to be seen to see if that is the case or if this is just a virus.

## 2018-02-15 NOTE — Telephone Encounter (Signed)
Author phoned pt. to relay Dr. Nonda Lou message, pt. appreciative.

## 2018-02-15 NOTE — Telephone Encounter (Signed)
Called and spoke with pt stating to her that BQ stated she needed to come in for an appt to make sure she was not coming down with pna due to her symptoms and since she was on the Power now.  Pt expressed understanding. Scheduled pt an appt tomorrow, 11/13 with Wyn Quaker, NP at 9:45. Nothing further needed.

## 2018-02-15 NOTE — Telephone Encounter (Signed)
Spoke with pt, she woke up Sunday with a runny nose and started feeling really bad. She had to sleep sitting up  last night. She thinks she is getting a cold and this usually leads to pneumonia if not taken care of.. She is coughing from the PND and she is afraid to take anything for her symptoms. She is on OFEV and scared of what may interact with it. She denies fever but states her throat is scratchy. BQ please advise.    [facility]  Current Outpatient Medications on File Prior to Visit  Medication Sig Dispense Refill  . benzonatate (TESSALON) 100 MG capsule Take 1 capsule (100 mg total) by mouth 3 (three) times daily as needed for cough. 90 capsule 2  . Biotin (RA BIOTIN) 1000 MCG tablet Take 1,000 mcg by mouth 2 (two) times daily.     . clopidogrel (PLAVIX) 75 MG tablet Take 1 tablet (75 mg total) by mouth daily. 30 tablet 0  . dicyclomine (BENTYL) 20 MG tablet TAKE 1 TABLET BY MOUTH FOUR TIMES A DAY AS NEEDED (Patient taking differently: Take 20 mg by mouth 4 (four) times daily as needed for spasms. ) 371 tablet 0  . folic acid (FOLVITE) 1 MG tablet Take 1 tablet (1 mg total) by mouth daily. 30 tablet 0  . furosemide (LASIX) 40 MG tablet Take 1 tablet (40 mg total) by mouth daily. (Patient taking differently: Take 40 mg by mouth 2 (two) times daily. ) 30 tablet 0  . lactulose, encephalopathy, (CHRONULAC) 10 GM/15ML SOLN TAKE 30 MLS (20 G TOTAL) BY MOUTH EVERY 6 (SIX) HOURS AS NEEDED. 1892 mL 1  . levothyroxine (SYNTHROID) 112 MCG tablet Take 1 tablet (112 mcg total) by mouth daily before breakfast. 30 tablet 0  . mometasone-formoterol (DULERA) 100-5 MCG/ACT AERO Inhale 2 puffs into the lungs 2 (two) times daily. 13 g 0  . Multiple Vitamin (MULTIVITAMIN) tablet Take 1 tablet by mouth daily. Gluten free/Vegetarian Multivitamin    . Omega-3 Fatty Acids (OMEGA 3 PO) Take 1 capsule by mouth every other day.    . pantoprazole (PROTONIX) 40 MG tablet Take 1 tablet (40 mg total) by mouth 2 (two) times  daily. 30 tablet 0  . PAPAYA ENZYME PO Take 1 capsule by mouth 2 (two) times daily.    . polyethylene glycol (MIRALAX / GLYCOLAX) packet Take 17 g by mouth daily. (Patient taking differently: Take 17 g by mouth at bedtime. ) 14 each 0  . potassium chloride (K-DUR,KLOR-CON) 10 MEQ tablet Take 1 tablet (10 mEq total) by mouth daily. 30 tablet 0  . traZODone (DESYREL) 50 MG tablet TAKE 1 TABLET (50 MG TOTAL) BY MOUTH AT BEDTIME AS NEEDED FOR SLEEP. 90 tablet 1  . vitamin C (ASCORBIC ACID) 500 MG tablet Take 1,000 mg by mouth 2 (two) times daily.     . vitamin E 1000 UNIT capsule Take 1,000 Units by mouth daily.     No current facility-administered medications on file prior to visit.    Allergies  Allergen Reactions  . Prednisone Other (See Comments)    Changed personality   . Atrovent Hfa [Ipratropium Bromide Hfa] Other (See Comments)    Pt could not sleep  . Cheratussin Ac [Guaifenesin-Codeine]   . Codeine Other (See Comments)    Pt takes promethazine with codeine at home  . Hydrocodone Itching  . Influenza Vaccines Other (See Comments)    Pt reports heart attack after last flu shot  . Motrin [Ibuprofen]  Other (See Comments)    "Gives false reading in blood"

## 2018-02-16 ENCOUNTER — Encounter: Payer: Self-pay | Admitting: Pulmonary Disease

## 2018-02-16 ENCOUNTER — Ambulatory Visit (INDEPENDENT_AMBULATORY_CARE_PROVIDER_SITE_OTHER): Payer: Medicare Other | Admitting: Pulmonary Disease

## 2018-02-16 DIAGNOSIS — J069 Acute upper respiratory infection, unspecified: Secondary | ICD-10-CM | POA: Diagnosis not present

## 2018-02-16 DIAGNOSIS — H6983 Other specified disorders of Eustachian tube, bilateral: Secondary | ICD-10-CM | POA: Diagnosis not present

## 2018-02-16 DIAGNOSIS — J301 Allergic rhinitis due to pollen: Secondary | ICD-10-CM | POA: Insufficient documentation

## 2018-02-16 DIAGNOSIS — J310 Chronic rhinitis: Secondary | ICD-10-CM | POA: Insufficient documentation

## 2018-02-16 DIAGNOSIS — H6993 Unspecified Eustachian tube disorder, bilateral: Secondary | ICD-10-CM | POA: Insufficient documentation

## 2018-02-16 DIAGNOSIS — J849 Interstitial pulmonary disease, unspecified: Secondary | ICD-10-CM

## 2018-02-16 DIAGNOSIS — J309 Allergic rhinitis, unspecified: Secondary | ICD-10-CM | POA: Diagnosis not present

## 2018-02-16 MED ORDER — FLUTICASONE PROPIONATE 50 MCG/ACT NA SUSP: 2.0000 | Freq: Every day | NASAL | 3 refills | Status: DC

## 2018-02-16 NOTE — Assessment & Plan Note (Addendum)
Please start Flonase >>> 1 spray each nostril twice a day  We will also provide you nasal saline rinse samples today which she can use use this before you use your Flonase  If symptoms are not improving please contact our office  If symptoms are not improving by next Wednesday and 02/27/2018 please contact her office we could consider starting antibiotics  Keep follow-up at this office

## 2018-02-16 NOTE — Assessment & Plan Note (Signed)
We will also provide you nasal saline rinse samples today which she can use use this before you use your Flonase  If symptoms are not improving please contact our office   Continue 0FEV at this time  Keep follow-up at this office  We will repeat high-res CT spring/2020 Consider repeating pulmonary function test

## 2018-02-16 NOTE — Assessment & Plan Note (Signed)
Believe you have an upper respiratory infection today  Please start Flonase >>> 1 spray each nostril twice a day  We will also provide you nasal saline rinse samples today which she can use use this before you use your Flonase  If symptoms are not improving please contact our office  If symptoms are not improving by next Wednesday and 02/27/2018 please contact her office we could consider starting antibiotics  Keep follow-up at this office

## 2018-02-16 NOTE — Patient Instructions (Addendum)
Believe you have an upper respiratory infection today  Please start Flonase >>> 1 spray each nostril twice a day  We will also provide you nasal saline rinse samples today which she can use use this before you use your Flonase  If symptoms are not improving please contact our office  If symptoms are not improving by next Wednesday and 02/27/2018 please contact her office we could consider starting antibiotics    Continue 0FEV at this time  Keep follow-up at this office    November/2019 we will be moving! We will no longer be at our [place] location.  Be on the look out for a post card/mailer to let you know we have officially moved.  Our new address and phone number will be:  [address] [place], [place] [address] Telephone number: [phone]  It is flu season:   >>>Remember to be washing your hands regularly, using hand sanitizer, be careful to use around herself with has contact with people who are sick will increase her chances of getting sick yourself. >>> Best ways to protect herself from the flu: Receive the yearly flu vaccine, practice good hand hygiene washing with soap and also using hand sanitizer when available, eat a nutritious meals, get adequate rest, hydrate appropriately   Please contact the office if your symptoms worsen or you have concerns that you are not improving.   Thank you for choosing [facility] for your healthcare, and for allowing [place] to partner with you on your healthcare journey. I am thankful to be able to provide care to you today.   Wyn Quaker FNP-C

## 2018-02-16 NOTE — Progress Notes (Signed)
@Patient  ID: Alexandria [name], female    DOB: 1945-12-29, 72 y.o.   MRN: [id number]  Chief Complaint  Patient presents with  . Follow-up    SOB, Runny Nose     Referring provider: [name], *  HPI:  72 year old female former smoker followed in office for idiopathic pulmonary fibrosis recently started on 0FEV  PMH: Hypothyroidism, MI Smoker/ Smoking History: Former Smoker. 4 pack years.  Maintenance:  OFEV, [place] 100 Pt of: [place]   02/16/2018  - Visit   72 year old female patient presenting today for acute visit after 2-3 days of worsening cold symptoms.  Patient did recently start 0FEV on Sunday 02/13/18.  For her new diagnosis of IPF.  Patient reports that she has not had any diarrhea or known side effects from the effect she actually reports constipation today.  Patient is afebrile today with 97.5 temperature.  Patient is quite concerned that symptoms may be worsening as she usually gets a pneumonia every fall.  Patient is not currently using any Flonase, daily antihistamine.  Patient reports a daily antihistamines no work for her.  Patient's persistent complaint is a cough and nasal drainage.  Patient also reports that her cough has increased in spite of her Tessalon Perles 100 mg 3 times daily that she is taking.  Patient wanted to present today to ensure she was not having worsening pneumonia as well as worsening respiratory symptoms light of her new diagnosis of IPF.   Tests:  Chest imaging: September 2019 high-resolution CT scan of the chest considered probable UIP, differential diagnosis includes nonspecific interstitial pneumonitis.  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  ANA negative 01/2018 CBC with diff 400 eosinophils, ANCA test negative October 2019 bronchoscopy: Minimal white blood cells on cell count and differential, cytology showed minimal inflammatory change, just typical macrophages.  No malignant change.  Cultures negative  Path:  Echo:  Heart Catheterization:  FENO - FENO 11/30/2016  =   40 during flare off all rx  FENO:  Lab Results  Component Value Date   NITRICOXIDE 40 11/30/2016    PFT: PFT Results Latest Ref Rng & Units 12/30/2017  FVC-Pre L 2.10  FVC-Predicted Pre % 72  FVC-Post L 2.20  FVC-Predicted Post % 76  Pre FEV1/FVC % % 81  Post FEV1/FCV % % 85  FEV1-Pre L 1.70  FEV1-Predicted Pre % 78  FEV1-Post L 1.88  DLCO UNC% % 66  DLCO COR %Predicted % 100  TLC L 3.95  TLC % Predicted % 78  RV % Predicted % 85    Imaging: Dg Thoracic Spine 2 View  Result Date: 01/31/2018 CLINICAL DATA:  Back pain, injury EXAM: THORACIC SPINE 2 VIEWS COMPARISON:  CT chest 01/06/2018 FINDINGS: Kyphoscoliosis  unchanged. Multilevel disc degeneration with disc space narrowing and spurring. Negative for fracture. Atherosclerotic aortic arch. ACDF C6-7 with solid fusion and anterior plate IMPRESSION: Negative for thoracic fracture. Electronically Signed   By: Franchot Gallo M.D.   On: 01/31/2018 15:26   Dg Lumbar Spine 2-3 Views  Result Date: 01/31/2018 CLINICAL DATA:  Back injury.  Back pain EXAM: LUMBAR  SPINE - 2-3 VIEW COMPARISON:  CT abdomen pelvis 08/25/2017 FINDINGS: Grade 1 anterolisthesis L3-4 L4-5 unchanged. Facet degeneration at L4-5 and L5-S1. Advanced disc degeneration T12-L1. Mild disc degeneration at L4-5 and L3-4. Generalized osteopenia Negative for fracture Atherosclerotic aorta Mild levoscoliosis at L1. IMPRESSION: Negative for fracture. Electronically Signed   By: Franchot Gallo M.D.   On: 01/31/2018 15:25    Chart Review:    Specialty Problems      Pulmonary Problems   DOE (dyspnea on exertion)   SOB (shortness of breath)   Asthmatic bronchitis with acute exacerbation   Upper airway cough syndrome    rx max gerd 02/24/15 and d/c qvar > 85% improved, still some pm coughing > rec add 1st gen H1 at hs > resolved, try off all rx 04/16/2015  - flared mid Feb 2017  and did not resume rx as rec> 06/11/2015 suggested she resume gerd/h1 and f/u prn  - UGI 09/22/16 Pos mild/mod GERD  - FENO 11/30/2016  =   40 during flare off all rx - Allergy profile 11/30/2016 >  Eos 0.3 /  IgE  129 neg RAST        Chronic cough   Acute respiratory failure (HCC)   Dyspnea   ILD (interstitial lung disease) (HCC)   Allergic rhinitis   URI (upper respiratory infection)      Allergies  Allergen Reactions  . Prednisone Other (See Comments)    Changed personality   . Atrovent Hfa [Ipratropium Bromide Hfa] Other (See Comments)    Pt could not sleep  . Cheratussin Ac [Guaifenesin-Codeine]   . Codeine Other (See Comments)    Pt takes promethazine with codeine at home  . Hydrocodone Itching  . Influenza Vaccines Other (See Comments)    Pt reports heart attack after last flu shot  . Motrin [Ibuprofen] Other (See Comments)    "Gives false reading in blood"    Immunization History  Administered Date(s) Administered  . Td 04/06/2013  . Tdap 04/06/1998    Past Medical History:  Diagnosis Date  . Arthritis   . Asthmatic bronchitis with acute exacerbation 02/06/2015  . Bronchial pneumonia     . Colon polyp   . Diverticulitis   . DVT (deep venous thrombosis) ([place])    lower extremity  . H/O cardiac catheterization 2004   Normal coronary arteries  . Heart murmur   . History of stress test 05/21/2011  . Hx of echocardiogram 01/27/2010   Normal Ef 55% the transmitral spectral doppler flow pattern is normal for age. the left ventricular wall motion is normal  . Migraines   . Pancreatitis   . Thyroid disease   . TIA (transient ischemic attack)    8 years ago    Tobacco History: Social History   Tobacco Use  Smoking Status Former Smoker  . Years: 2.00  . Last attempt to quit: 04/06/1969  . Years since quitting: 48.8  Smokeless Tobacco Never Used  Tobacco Comment   socially smoked x 2 years   Counseling given: Yes Comment: socially smoked x 2 years  Continue to not smoke  Outpatient  Encounter Medications as of 02/16/2018  Medication Sig  . benzonatate (TESSALON) 100 MG capsule Take 1 capsule (100 mg total) by mouth 3 (three) times daily as needed for cough.  . Biotin (RA BIOTIN) 1000 MCG tablet Take 1,000 mcg by mouth 2 (two) times daily.   . clopidogrel (PLAVIX) 75 MG tablet Take 1 tablet (75 mg total) by mouth daily.  Marland Kitchen dicyclomine (BENTYL) 20 MG tablet TAKE 1 TABLET BY MOUTH FOUR TIMES A DAY AS NEEDED (Patient taking differently: Take 20 mg by mouth 4 (four) times daily as needed for spasms. )  . folic acid (FOLVITE) 1 MG tablet Take 1 tablet (1 mg total) by mouth daily.  . furosemide (LASIX) 40 MG tablet Take 1 tablet (40 mg total) by mouth daily. (Patient taking differently: Take 40 mg by mouth 2 (two) times daily. )  . lactulose, encephalopathy, (CHRONULAC) 10 GM/15ML SOLN TAKE 30 MLS (20 G TOTAL) BY MOUTH EVERY 6 (SIX) HOURS AS NEEDED.  Marland Kitchen levothyroxine (SYNTHROID) 112 MCG tablet Take 1 tablet (112 mcg total) by mouth daily before breakfast.  . Multiple Vitamin (MULTIVITAMIN) tablet Take 1 tablet by mouth daily. Gluten free/Vegetarian Multivitamin  . Omega-3  Fatty Acids (OMEGA 3 PO) Take 1 capsule by mouth every other day.  Marland Kitchen PAPAYA ENZYME PO Take 1 capsule by mouth 2 (two) times daily.  . polyethylene glycol (MIRALAX / GLYCOLAX) packet Take 17 g by mouth daily. (Patient taking differently: Take 17 g by mouth at bedtime. )  . potassium chloride (K-DUR,KLOR-CON) 10 MEQ tablet Take 1 tablet (10 mEq total) by mouth daily. (Patient taking differently: Take 10 mEq by mouth 2 (two) times daily. Take 3 times a day if needed per Dr. Etter Sjogren)  . vitamin C (ASCORBIC ACID) 500 MG tablet Take 1,000 mg by mouth 2 (two) times daily.   . vitamin E 1000 UNIT capsule Take 1,000 Units by mouth daily.  . fluticasone (FLONASE) 50 MCG/ACT nasal spray Place 2 sprays into both nostrils daily.  . mometasone-formoterol (DULERA) 100-5 MCG/ACT AERO Inhale 2 puffs into the lungs 2 (two) times daily. (Patient not taking: Reported on 02/16/2018)  . pantoprazole (PROTONIX) 40 MG tablet Take 1 tablet (40 mg total) by mouth 2 (two) times daily. (Patient not taking: Reported on 02/16/2018)  . traZODone (DESYREL) 50 MG tablet TAKE 1 TABLET (50 MG TOTAL) BY MOUTH AT BEDTIME AS NEEDED FOR SLEEP. (Patient not taking: Reported on 02/16/2018)   No facility-administered encounter medications on file as of 02/16/2018.      Review of Systems  Review of Systems  Constitutional: Negative for chills, fatigue, fever and unexpected weight change.  HENT: Positive for congestion, postnasal drip and sinus pressure. Negative for ear pain and sinus pain.   Respiratory: Positive for cough (Worsened). Negative for chest tightness, shortness of breath and wheezing.   Cardiovascular: Negative for chest pain and palpitations.  Gastrointestinal: Negative for blood in stool, diarrhea, nausea and vomiting.  Genitourinary: Negative for dysuria, frequency and urgency.  Musculoskeletal: Negative for arthralgias.  Skin: Negative for color change.  Allergic/Immunologic: Negative for environmental allergies and  food allergies.  Neurological: Negative for dizziness, light-headedness and headaches.  Psychiatric/Behavioral: Negative for dysphoric mood. The patient is not nervous/anxious.   All other systems reviewed and are negative.    Physical Exam  BP 132/80 (BP Location: Left Arm, Cuff Size: Normal)   Pulse 77   Temp (!) 97.5 F (36.4 C) (Oral)   Ht 5\' 4"  (1.626 m)   Wt  138 lb (62.6 kg)   SpO2 98%   BMI 23.69 kg/m   Wt Readings from Last 5 Encounters:  02/16/18 138 lb (62.6 kg)  02/01/18 131 lb (59.4 kg)  01/24/18 134 lb (60.8 kg)  01/18/18 135 lb (61.2 kg)  01/17/18 133 lb (60.3 kg)    Physical Exam  Constitutional: She is oriented to person, place, and time and well-developed, well-nourished, and in no distress. No distress.  HENT:  Head: Normocephalic and atraumatic.  Right Ear: Hearing, external ear and ear canal normal.  Left Ear: Hearing, external ear and ear canal normal.  Nose: Mucosal edema and rhinorrhea present. Right sinus exhibits maxillary sinus tenderness. Right sinus exhibits no frontal sinus tenderness. Left sinus exhibits maxillary sinus tenderness. Left sinus exhibits no frontal sinus tenderness.  Mouth/Throat: Uvula is midline and oropharynx is clear and moist. No oropharyngeal exudate.  +PND, TMs with effusion bilaterally without infection  Eyes: Pupils are equal, round, and reactive to light.  Neck: Normal range of motion. Neck supple. No JVD present.  Cardiovascular: Normal rate, regular rhythm and normal heart sounds.  Pulmonary/Chest: Effort normal. No accessory muscle usage. No respiratory distress. She has no decreased breath sounds. She has no wheezes. She has no rhonchi. She has rales (BB crackles, R>L).  Abdominal: Soft. Bowel sounds are normal. There is no tenderness.  Musculoskeletal: Normal range of motion. She exhibits no edema.  Lymphadenopathy:    She has no cervical adenopathy.  Neurological: She is alert and oriented to person, place, and  time. Gait normal.  Skin: Skin is warm and dry. She is not diaphoretic. No erythema.  Psychiatric: Mood, memory, affect and judgment normal.  Nursing note and vitals reviewed.    Lab Results:  CBC    Component Value Date/Time   WBC 7.1 01/06/2018 1324   RBC 4.26 01/06/2018 1324   HGB 14.0 01/06/2018 1324   HCT 40.1 01/06/2018 1324   PLT 238 01/06/2018 1324   MCV 94.1 01/06/2018 1324   MCH 32.9 01/06/2018 1324   MCHC 34.9 01/06/2018 1324   RDW 12.7 01/06/2018 1324   LYMPHSABS 2.8 01/06/2018 1324   MONOABS 0.9 01/06/2018 1324   EOSABS 0.4 01/06/2018 1324   BASOSABS 0.1 01/06/2018 1324    BMET    Component Value Date/Time   NA 141 02/09/2018 0838   K 3.5 02/09/2018 0838   CL 102 02/09/2018 0838   CO2 31 02/09/2018 0838   GLUCOSE 91 02/09/2018 0838   BUN 11 02/09/2018 0838   CREATININE 0.70 02/09/2018 0838   CREATININE 0.74 06/11/2015 1002   CALCIUM 9.3 02/09/2018 0838   GFRNONAA >60 01/10/2018 0438   GFRAA >60 01/10/2018 0438    BNP    Component Value Date/Time   BNP 270.1 (H) 01/07/2018 0237    ProBNP No results found for: PROBNP    Assessment & Plan:   Pleasant 72 year old female patient completing acute visit with our office today.  Believe the patient has a viral upper respiratory infection.  Will start patient on Flonase, and nasal saline rinses.  Patient with eustachian tube dysfunction as well as allergic rhinitis symptoms.  I do not believe the patient needs a chest x-ray today or antibiotics.  I did offer a chest x-ray to the patient as she does have concerns of pneumonia.  Patient declined.   Patient to contact our office if symptoms are not improving.  If symptoms have not improved her sinus tenderness worsens by next week.  Or if patient starts  having colored nasal drainage, or fevers then we could consider antibiotic therapy.  ILD (interstitial lung disease) ([place]) We will also provide you nasal saline rinse samples today which she can use use  this before you use your Flonase  If symptoms are not improving please contact our office   Continue 0FEV at this time  Keep follow-up at this office  We will repeat high-res CT spring/2020 Consider repeating pulmonary function test  Allergic rhinitis Please start Flonase >>> 1 spray each nostril twice a day  We will also provide you nasal saline rinse samples today which she can use use this before you use your Flonase  If symptoms are not improving please contact our office  If symptoms are not improving by next Wednesday and 02/27/2018 please contact her office we could consider starting antibiotics  Keep follow-up at this office  URI (upper respiratory infection) Believe you have an upper respiratory infection today  Please start Flonase >>> 1 spray each nostril twice a day  We will also provide you nasal saline rinse samples today which she can use use this before you use your Flonase  If symptoms are not improving please contact our office  If symptoms are not improving by next Wednesday and 02/27/2018 please contact her office we could consider starting antibiotics  Keep follow-up at this office     Lauraine Rinne, NP 02/16/2018

## 2018-02-17 ENCOUNTER — Telehealth: Payer: Self-pay | Admitting: Pulmonary Disease

## 2018-02-17 MED ORDER — DOXYCYCLINE HYCLATE 100 MG PO TABS: 100.0000 mg | ORAL_TABLET | Freq: Two times a day (BID) | ORAL | 0 refills | Status: DC

## 2018-02-17 NOTE — Progress Notes (Signed)
Reviewed, agree 

## 2018-02-17 NOTE — Telephone Encounter (Signed)
Called pt and advised message from the provider. Pt understood and verbalized understanding. Nothing further is needed.   Rx sent.

## 2018-02-17 NOTE — Telephone Encounter (Signed)
Spoke with pt, she states she is not feeling any better and has been up all night. She states her chest is heavier and her coughing is worse but no mucus production. She did attempt the Flonase but had no relief. She stated [name] advised her to call if she wasn't getting any better so he could send in ABX. [name] please advise.   [facility] [address]     Consider repeating pulmonary function test    Patient Instructions by Lauraine Rinne, NP at 02/16/2018 9:45 AM  Author: Lauraine Rinne, NP Author Type: Nurse Practitioner Filed: 02/16/2018 10:21 AM  Note Status: Addendum Cosign: Cosign Not Required Encounter Date: 02/16/2018  Editor: Lauraine Rinne, NP (Nurse Practitioner)  Prior Versions: 1. Lauraine Rinne, NP (Nurse Practitioner) at 02/16/2018 10:20 AM - Signed    Believe you have an upper respiratory infection today  Please start Flonase >>> 1 spray each nostril twice a day  We will also provide you nasal saline rinse samples today which she can use use this before you use your Flonase  If symptoms are not improving please contact our office  If symptoms are not improving by next Wednesday and 02/27/2018 please contact her office we could consider starting antibiotics

## 2018-02-17 NOTE — Telephone Encounter (Signed)
I am sorry to hear the patients symptoms are worse. She really should continue to use the Flonase as she had allergic rhinitis symptoms as well. Continue Nasal saline rinses.   Can offer:   Doxycycline >>> 1 100 mg tablet every 12 hours for 7 days >>>take with food  >>>wear sunscreen   As patient had sinus tenderness and bronchitis symptoms. Please place the order.     Wyn Quaker FNP

## 2018-02-22 ENCOUNTER — Telehealth: Payer: Self-pay | Admitting: Pulmonary Disease

## 2018-02-22 NOTE — Telephone Encounter (Signed)
Spoke with [name] first about options for pt. He expressed that he didn't want to add or change anything at this time.   I spoke with pt in the lobby and advised her of the recommendations and she seemed understanding. Alexandria [name]

## 2018-02-22 NOTE — Telephone Encounter (Signed)
Pt was probably viral URI / sinusitis, covered with antibiotics with her change of symptoms. If patient would like to be seen in office that would allow for further evaluation. There will be no more telephonic management of these symptoms.   Pt was seen on 02/16/18 and was informed this is most likely a viral course which can be 10-14 days of symptoms, with supportive measures. Pt was started on antibiotics on 02/17/18 off her request and stating her symptoms had worsened. I still suspect this is a viral course.   Wyn Quaker FNP

## 2018-02-22 NOTE — Telephone Encounter (Signed)
Thanks [name] noted.

## 2018-02-23 DIAGNOSIS — J060 Acute laryngopharyngitis: Secondary | ICD-10-CM | POA: Diagnosis not present

## 2018-02-23 DIAGNOSIS — R05 Cough: Secondary | ICD-10-CM | POA: Diagnosis not present

## 2018-02-23 DIAGNOSIS — J069 Acute upper respiratory infection, unspecified: Secondary | ICD-10-CM | POA: Diagnosis not present

## 2018-02-23 DIAGNOSIS — J0141 Acute recurrent pansinusitis: Secondary | ICD-10-CM | POA: Diagnosis not present

## 2018-02-23 DIAGNOSIS — I700 Atherosclerosis of aorta: Secondary | ICD-10-CM | POA: Diagnosis not present

## 2018-02-24 ENCOUNTER — Ambulatory Visit: Payer: BLUE CROSS/BLUE SHIELD | Admitting: Medical

## 2018-02-24 LAB — FUNGAL ORGANISM REFLEX

## 2018-02-24 LAB — FUNGUS CULTURE WITH STAIN

## 2018-02-24 LAB — FUNGUS CULTURE RESULT

## 2018-02-25 ENCOUNTER — Telehealth: Payer: Self-pay | Admitting: Pulmonary Disease

## 2018-02-25 DIAGNOSIS — J849 Interstitial pulmonary disease, unspecified: Secondary | ICD-10-CM

## 2018-02-25 NOTE — Telephone Encounter (Signed)
Returned call to Patient. Patient stated that she talked with rep from [facility], Helene Kelp, who asked if she had ever been referred to pulmonary rehab.  She stated that Dr. Lake Bells had talked with her about that before, but she did not want to do it at that time.  She is now wanting a referral to start pulmonary rehab.  She also would like Dr. Lake Bells to know that her antacid is not working, she is currently taking papaya.     Dr. Lake Bells, please advise

## 2018-02-28 MED ORDER — PANTOPRAZOLE SODIUM 40 MG PO TBEC: 40.0000 mg | DELAYED_RELEASE_TABLET | Freq: Every day | ORAL | 1 refills | Status: DC

## 2018-02-28 NOTE — Telephone Encounter (Signed)
Called and spoke with patient she is aware and verbalized understanding. Order sent. Nothing further needed.

## 2018-02-28 NOTE — Telephone Encounter (Signed)
Protonix 40mg  daily dispense 30, refill 2

## 2018-02-28 NOTE — Telephone Encounter (Signed)
Which antacid?  OK to refer to Pulmonary rehab: reason IPF

## 2018-02-28 NOTE — Telephone Encounter (Signed)
Order has been placed for pulmonary rehab. Patient stated that she was taking papaya for her acid reflux but it is not helping she would like something called in.    BQ please advise, thank you.

## 2018-03-08 ENCOUNTER — Other Ambulatory Visit: Payer: Self-pay | Admitting: Family Medicine

## 2018-03-08 NOTE — Telephone Encounter (Signed)
It looks like dose was changed to once a day maybe by hospital, what dose do you want her on.

## 2018-03-08 NOTE — Telephone Encounter (Signed)
Whatever hosp put her on

## 2018-03-09 LAB — ACID FAST CULTURE WITH REFLEXED SENSITIVITIES (MYCOBACTERIA): Acid Fast Culture: NEGATIVE

## 2018-03-10 ENCOUNTER — Ambulatory Visit (INDEPENDENT_AMBULATORY_CARE_PROVIDER_SITE_OTHER): Payer: Medicare Other | Admitting: Pulmonary Disease

## 2018-03-10 ENCOUNTER — Telehealth: Payer: Self-pay | Admitting: Pulmonary Disease

## 2018-03-10 ENCOUNTER — Encounter: Payer: Self-pay | Admitting: Pulmonary Disease

## 2018-03-10 VITALS — BP 138/80 | HR 78 | Ht 63.94 in | Wt 139.0 lb

## 2018-03-10 DIAGNOSIS — J84112 Idiopathic pulmonary fibrosis: Secondary | ICD-10-CM | POA: Diagnosis not present

## 2018-03-10 DIAGNOSIS — Z5181 Encounter for therapeutic drug level monitoring: Secondary | ICD-10-CM | POA: Diagnosis not present

## 2018-03-10 LAB — COMPREHENSIVE METABOLIC PANEL
ALBUMIN: 4.2 g/dL (ref 3.5–5.2)
ALT: 17 U/L (ref 0–35)
AST: 20 U/L (ref 0–37)
Alkaline Phosphatase: 81 U/L (ref 39–117)
BUN: 12 mg/dL (ref 6–23)
CHLORIDE: 98 meq/L (ref 96–112)
CO2: 32 mEq/L (ref 19–32)
Calcium: 9.6 mg/dL (ref 8.4–10.5)
Creatinine, Ser: 0.78 mg/dL (ref 0.40–1.20)
GFR: 76.9 mL/min (ref >60.00)
Glucose, Bld: 105 mg/dL — ABNORMAL HIGH (ref 70–99)
Potassium: 3.4 mEq/L — ABNORMAL LOW (ref 3.5–5.1)
SODIUM: 138 meq/L (ref 135–145)
Total Bilirubin: 0.4 mg/dL (ref 0.2–1.2)
Total Protein: 7.4 g/dL (ref 6.0–8.3)

## 2018-03-10 NOTE — Progress Notes (Signed)
Synopsis: Former patient of Dr. Melvyn Novas with UACS and asthma who was found on chest imaging to have evidence of diffuse parenchymal lung disease.  She has a history of acid reflux and intolerance to antacids.  She treats her reflux with papaya. She says that she has had recurrent episodes of bronchitis on an annual basis for many years.  Subjective:   PATIENT ID: Alexandria [name] GENDER: female DOB: 1945/11/05, MRN: [id number]   HPI  Chief Complaint  Patient presents with  . Follow-up    reports breathing is improving    Alexandria [name] says taht she is doing well. She is taking papaya in the afternoon and prilosec in the mornings.   She is walking routinely, 2 miles a day, frequently stops to chat with friends. She feels that her breathing is improving.  She is able to sing and hold her notes longer now. She has not had problems with cough or bronchitis since the last visit. Her acid reflux is better controlled with her current regimen.  She is dealing with some constipation from time to time.  Past Medical History:  Diagnosis Date  . Arthritis   . Asthmatic bronchitis with acute exacerbation 02/06/2015  . Bronchial pneumonia   . Colon polyp   . Diverticulitis   . DVT (deep venous thrombosis) ([place])    lower extremity  . H/O cardiac catheterization 2004   Normal coronary arteries  . Heart murmur   . History of stress test 05/21/2011  . Hx of echocardiogram 01/27/2010   Normal Ef 55% the transmitral spectral doppler flow pattern is normal for age. the left ventricular wall motion is normal  . Migraines   . Pancreatitis   . Thyroid disease   . TIA (transient ischemic attack)    8 years ago     Review of Systems  Constitutional: Negative for diaphoresis, fever and weight loss.  HENT: Negative for congestion, ear discharge and sinus pain.   Respiratory: Negative for cough, sputum production, wheezing and stridor.   Cardiovascular: Negative for chest pain, orthopnea and leg  swelling.      Objective:  Physical Exam   Vitals:   03/10/18 0920  BP: 138/80  Pulse: 78  SpO2: 100%  Weight: 139 lb (63 kg)  Height: 5' 3.94" (1.624 m)    RA  Gen: well appearing HENT: OP clear, TM's clear, neck supple PULM: CTA B, normal percussion CV: RRR, no mgr, trace edema GI: BS+, soft, nontender Derm: no cyanosis or rash Psyche: normal mood and affect    CBC    Component Value Date/Time   WBC 7.1 01/06/2018 1324   RBC 4.26 01/06/2018 1324   HGB 14.0 01/06/2018 1324   HCT 40.1 01/06/2018 1324   PLT 238 01/06/2018 1324   MCV 94.1 01/06/2018 1324   MCH 32.9 01/06/2018 1324   MCHC 34.9 01/06/2018 1324   RDW 12.7 01/06/2018 1324   LYMPHSABS 2.8 01/06/2018 1324   MONOABS 0.9 01/06/2018 1324   EOSABS 0.4 01/06/2018 1324   BASOSABS 0.1 01/06/2018 1324     Chest imaging: September 2019 high-resolution CT scan of the chest considered probable UIP, differential diagnosis includes nonspecific interstitial pneumonitis.  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  ANA negative 01/2018 CBC with diff 400 eosinophils, ANCA test negative October 2019  bronchoscopy: Minimal white blood cells on cell count and differential, cytology showed minimal inflammatory change, just typical macrophages.  No malignant change.  Cultures negative   Path:  Echo:  Heart Catheterization:  FENO - FENO 11/30/2016  =   40 during flare off all rx   Records from her visit with our nurse practitioner in November reviewed where she had a cold.  Symptomatic therapy offered.    Assessment & Plan:   Therapeutic drug monitoring - Plan: Comprehensive metabolic panel  IPF (idiopathic pulmonary fibrosis) ([place]) - Plan: 6 minute walk, Ambulatory referral to Physical Therapy  Discussion: This has been a stable interval for Alexandria.  She has not had an exacerbation or worsening since the last visit.  Today we talked a lot about exercise.  She is interested in pushing her activity more.  She seems to be tolerating nintedanib without too much difficulty though she does have some constipation.  Plan: Idiopathic pulmonary fibrosis: Continue nintedanib as you are doing We will check liver function testing today to make sure there is no evidence of toxicity Exercise caution when dealing with holistic providers to claim to be experts in this field: Any recommendation which involves whole foods or exercise is likely to be sounds, though I would exercise extreme caution with any supplements they try to sell you We will make sure you have a lung function test in March 6-minute walk test  History of bilateral knee replacement with pain: You need to exercise more, I will refer you to [facility] for further evaluation for how to increase activity and exercise  We will see you back in 3 months or sooner if needed   Current Outpatient Medications:  .  benzonatate (TESSALON) 100 MG capsule, Take 1 capsule (100 mg total) by mouth 3 (three) times daily as needed for cough., Disp: 90 capsule, Rfl: 2 .  Biotin (RA BIOTIN) 1000 MCG tablet, Take 1,000 mcg by mouth 2 (two)  times daily. , Disp: , Rfl:  .  clopidogrel (PLAVIX) 75 MG tablet, Take 1 tablet (75 mg total) by mouth daily., Disp: 30 tablet, Rfl: 0 .  dicyclomine (BENTYL) 20 MG tablet, TAKE 1 TABLET BY MOUTH FOUR TIMES A DAY AS NEEDED (Patient taking differently: Take 20 mg by mouth 4 (four) times daily as needed for spasms. ), Disp: 360 tablet, Rfl: 0 .  fluticasone (FLONASE) 50 MCG/ACT nasal spray, Place 2 sprays into both nostrils daily., Disp: 16 g, Rfl: 3 .  folic acid (FOLVITE) 1 MG tablet, Take 1 tablet (1 mg total) by mouth daily., Disp: 30 tablet, Rfl: 0 .  furosemide (LASIX) 40 MG tablet, Take 1 tablet (40 mg total) by mouth daily. (Patient taking differently: Take 40 mg by mouth 2 (two) times daily. ), Disp: 30 tablet, Rfl: 0 .  Multiple Vitamin (MULTIVITAMIN) tablet, Take 1 tablet by mouth daily. Gluten free/Vegetarian Multivitamin, Disp: , Rfl:  .  Nintedanib (OFEV) 150  MG CAPS, Take 150 mg by mouth 2 (two) times daily., Disp: , Rfl:  .  Omega-3 Fatty Acids (OMEGA 3 PO), Take 1 capsule by mouth every other day., Disp: , Rfl:  .  pantoprazole (PROTONIX) 40 MG tablet, Take 1 tablet (40 mg total) by mouth daily., Disp: 30 tablet, Rfl: 1 .  PAPAYA ENZYME PO, Take 1 capsule by mouth 2 (two) times daily., Disp: , Rfl:  .  polyethylene glycol (MIRALAX / GLYCOLAX) packet, Take 17 g by mouth daily. (Patient taking differently: Take 17 g by mouth at bedtime. ), Disp: 14 each, Rfl: 0 .  potassium chloride (K-DUR,KLOR-CON) 10 MEQ tablet, Take 1 tablet (10 mEq total) by mouth daily. (Patient taking differently: Take 10 mEq by mouth 2 (two) times daily. Take 3 times a day if needed per Dr. Etter Sjogren), Disp: 30 tablet, Rfl: 0 .  SYNTHROID 112 MCG tablet, TAKE 1 TABLET BY MOUTH EVERY DAY BEFORE BREAKFAST, Disp: 90 tablet, Rfl: 1 .  vitamin C (ASCORBIC ACID) 500 MG tablet, Take 1,000 mg by mouth 2 (two) times daily. , Disp: , Rfl:  .  vitamin E 1000 UNIT capsule, Take 1,000 Units by mouth daily., Disp: , Rfl:

## 2018-03-10 NOTE — Telephone Encounter (Signed)
Called and spoke to patient, made aware of lab results. Nothing further is needed at this time.

## 2018-03-10 NOTE — Patient Instructions (Signed)
Idiopathic pulmonary fibrosis: Continue nintedanib as you are doing We will check liver function testing today to make sure there is no evidence of toxicity Exercise caution when dealing with holistic providers to claim to be experts in this field: Any recommendation which involves whole foods or exercise is likely to be sounds, though I would exercise extreme caution with any supplements they try to sell you We will make sure you have a lung function test in March 6-minute walk test  History of bilateral knee replacement with pain: You need to exercise more, I will refer you to [facility] for further evaluation for how to increase activity and exercise  We will see you back in 3 months or sooner if needed

## 2018-03-11 ENCOUNTER — Telehealth (HOSPITAL_COMMUNITY): Payer: Self-pay | Admitting: *Deleted

## 2018-03-11 NOTE — Telephone Encounter (Signed)
Office referral received for pt to participate in pulmonary rehab with diagnosis of ILD dated 11/22.  Review of pt medical chart.  Noted in progress note from 12/5.  Pt is referred to [facility] for further evaluation for how to increase activity and exercise.  Patient called and message left for clarification of which she would like to do.  Await return call. Cherre Huger, BSN Cardiac and Training and development officer

## 2018-03-14 DIAGNOSIS — R0602 Shortness of breath: Secondary | ICD-10-CM | POA: Diagnosis not present

## 2018-03-14 DIAGNOSIS — J849 Interstitial pulmonary disease, unspecified: Secondary | ICD-10-CM | POA: Diagnosis not present

## 2018-03-16 DIAGNOSIS — J84112 Idiopathic pulmonary fibrosis: Secondary | ICD-10-CM | POA: Diagnosis not present

## 2018-03-17 DIAGNOSIS — R1012 Left upper quadrant pain: Secondary | ICD-10-CM | POA: Diagnosis not present

## 2018-03-17 DIAGNOSIS — Z86010 Personal history of colonic polyps: Secondary | ICD-10-CM | POA: Diagnosis not present

## 2018-03-17 DIAGNOSIS — K5900 Constipation, unspecified: Secondary | ICD-10-CM | POA: Diagnosis not present

## 2018-03-18 DIAGNOSIS — J84112 Idiopathic pulmonary fibrosis: Secondary | ICD-10-CM | POA: Diagnosis not present

## 2018-03-21 ENCOUNTER — Ambulatory Visit: Admitting: Family Medicine

## 2018-03-21 DIAGNOSIS — J84112 Idiopathic pulmonary fibrosis: Secondary | ICD-10-CM | POA: Diagnosis not present

## 2018-03-22 ENCOUNTER — Ambulatory Visit: Admitting: Family Medicine

## 2018-03-22 DIAGNOSIS — H2511 Age-related nuclear cataract, right eye: Secondary | ICD-10-CM | POA: Diagnosis not present

## 2018-03-22 DIAGNOSIS — H25811 Combined forms of age-related cataract, right eye: Secondary | ICD-10-CM | POA: Diagnosis not present

## 2018-03-22 DIAGNOSIS — H2512 Age-related nuclear cataract, left eye: Secondary | ICD-10-CM | POA: Diagnosis not present

## 2018-03-22 HISTORY — PX: CATARACT EXTRACTION: SUR2

## 2018-03-24 ENCOUNTER — Ambulatory Visit (INDEPENDENT_AMBULATORY_CARE_PROVIDER_SITE_OTHER): Payer: Medicare Other | Admitting: Family Medicine

## 2018-03-24 ENCOUNTER — Encounter: Payer: Self-pay | Admitting: Family Medicine

## 2018-03-24 ENCOUNTER — Encounter: Payer: Self-pay | Admitting: Neurology

## 2018-03-24 VITALS — HR 76 | Wt 144.0 lb

## 2018-03-24 DIAGNOSIS — J84112 Idiopathic pulmonary fibrosis: Secondary | ICD-10-CM | POA: Diagnosis not present

## 2018-03-24 DIAGNOSIS — Z8673 Personal history of transient ischemic attack (TIA), and cerebral infarction without residual deficits: Secondary | ICD-10-CM | POA: Diagnosis not present

## 2018-03-24 DIAGNOSIS — G43909 Migraine, unspecified, not intractable, without status migrainosus: Secondary | ICD-10-CM | POA: Diagnosis not present

## 2018-03-24 NOTE — Patient Instructions (Signed)
Transient Ischemic Attack  A transient ischemic attack (TIA) is a "warning stroke" that causes stroke-like symptoms that then go away quickly. The symptoms of a TIA come on suddenly, and they last less than 24 hours. Unlike a stroke, a TIA does not cause permanent damage to the brain. It is important to know the symptoms of a TIA and what to do. Seek medical care right away, even if your symptoms go away.  Having a TIA is a sign that you are at higher risk for a permanent stroke. Lifestyle changes and medical treatments can help prevent a stroke.  What are the causes?  This condition is caused by a temporary blockage in an artery in the head or neck. The blockage does not allow the brain to get the blood supply it needs and can cause various symptoms. The blockage can be caused by:   Fatty buildup in an artery in the head or neck (atherosclerosis).   A blood clot.   Tearing of an artery (dissection).   Inflammation of an artery (vasculitis).  Sometimes the cause is not known.  What increases the risk?  Certain factors may make you more likely to develop this condition. Some of these factors are things that you can change, such as:   Obesity.   Using products that contain nicotine or tobacco, such as cigarettes and e-cigarettes.   Taking oral birth control, especially if you also use tobacco.   Lack of physical inactivity.   Excessive use of alcohol.   Use of drugs, especially cocaine and methamphetamine.  Other risk factors include:   High blood pressure (hypertension).   High cholesterol.   Diabetes mellitus.   Heart disease (coronary artery disease).   Atrial fibrillation.   Being African American or Hispanic.   Being over the age of 60.   Being female.   Family history of stroke.   Previous history of blood clots, stroke, TIA, or heart attack.   Sickle cell disease.   Being a woman with a history of preeclampsia.   Migraine headache.   Sleep apnea.   Chronic inflammatory diseases, such as  rheumatoid arthritis or lupus.   Blood clotting disorders (hypercoagulable state).  What are the signs or symptoms?  Symptoms of this condition are the same as those of a stroke, but they are temporary. The symptoms develop suddenly, and they go away quickly, usually within minutes to hours. Symptoms may include sudden:   Weakness or numbness in your face, arm, or leg, especially on one side of your body.   Trouble walking or difficulty moving your arms or legs.   Trouble speaking, understanding speech, or both (aphasia).   Vision changes in one or both eyes. These include double vision, blurred vision, or loss of vision.   Dizziness.   Confusion.   Loss of balance or coordination.   Nausea and vomiting.   Severe headache with no known cause.  If possible, make note of the exact time that you last felt like your normal self and what time your symptoms started. Tell your health care provider.  How is this diagnosed?  This condition may be diagnosed based on:   Your symptoms and medical history.   A physical exam.   Imaging tests, usually a CT or MRI scan of the brain.   Blood tests.  You may also have other tests, including:   Electrocardiogram (ECG).   Echocardiogram.   Carotid ultrasound.   A scan of the brain circulation (CT angiogram   or MRI angiogram).   Continuous heart monitoring.  How is this treated?  The goal of treatment is to reduce the risk for a subsequent stroke. Treatment may include stroke prevention therapies such as:   Changes to diet or lifestyle to decrease your risk. Lifestyle changes may include exercising and stopping smoking.   Medicines to thin the blood (antiplatelets or anticoagulants).   Blood pressure medicines.   Medicines to reduce cholesterol.   Treating other health conditions, such as diabetes or atrial fibrillation.  If testing shows that you have narrowing in the arteries to your brain, your health care provider may recommend a procedure, such as:   Carotid  endarterectomy. This is a surgery to remove the blockage from your artery.   Carotid angioplasty and stenting. This is a procedure to open or widen an artery in the neck using a metal mesh tube (stent). The stent helps keep the artery open by supporting the artery walls.  Follow these instructions at home:  Medicines     Take over-the-counter and prescription medicines only as told by your health care provider.   If you were told to take a medicine to thin your blood, such as aspirin or an anticoagulant, take it exactly as told by your health care provider.  ? Taking too much blood-thinning medicine can cause bleeding.  ? If you do not take enough blood-thinning medicine, you will not have the protection that you need against a stroke and other problems.  Eating and drinking     Eat 5 or more servings of fruits and vegetables each day.   Follow instructions from your health care provider about diet. You may need to follow a certain nutrition plan to help manage risk factors for stroke, such as high blood pressure, high cholesterol, diabetes, or obesity. This may include:  ? Eating a low-fat, low-salt diet.  ? Including a lot of fiber in your diet.  ? Limiting the amount of carbohydrates and sugar in your diet.   Limit alcohol intake to no more than 1 drink a day for nonpregnant women and 2 drinks a day for men. One drink equals 12 oz of beer, 5 oz of wine, or 1 oz of hard liquor.  General instructions   Maintain a healthy weight.   Stay physically active. Try to get at least 30 minutes of exercise on most or all days.   Find out if you have sleep apnea, and seek treatment if needed.   Do not use any products that contain nicotine or tobacco, such as cigarettes and e-cigarettes. If you need help quitting, ask your health care provider.   Do not abuse drugs.   Keep all follow-up visits as told by your health care provider. This is important.  Where to find more information   American Stroke Association:  www.strokeassociation.org   National Stroke Association: www.stroke.org  Get help right away if:   You have chest pain or an irregular heartbeat.   You have any symptoms of stroke. The acronym BEFAST is an easy way to remember the main warning signs of stroke.  ? B - Balance problems. Signs include dizziness, sudden trouble walking, or loss of balance.  ? E - Eye problems. This includes trouble seeing or a sudden change in vision.  ? F - Face changes. This includes sudden weakness or numbness of the face, or the face or eyelid drooping to one side.  ? A - Arm weakness or numbness. This happens suddenly and usually   on one side of the body.  ? S - Speech problems. This includes trouble speaking or trouble understanding speech.  ? T - Time. Time to call 911 or seek emergency care. Do not wait to see if symptoms will go away. Make note of the time your symptoms started.   Other signs of stroke may include:  ? A sudden, severe headache with no known cause.  ? Nausea or vomiting.  ? Seizure.  These symptoms may represent a serious problem that is an emergency. Do not wait to see if the symptoms will go away. Get medical help right away. Call your local emergency services (911 in the [place] Do not drive yourself to the hospital.  Summary   A TIA happens when an artery in the head or neck is blocked, leading to stroke-like symptoms that then go away quickly. The blockage clears before there is any permanent brain damage. A TIA is a medical emergency and requires immediate medical attention.   Symptoms of this condition are the same as those of a stroke, but they are temporary. The symptoms usually develop suddenly, and they go away quickly, usually within minutes to hours.   Having a TIA means that you are at high risk of a stroke in the near future.   Treatment may include medicines to thin the blood as well as medicines, diet changes, and lifestyle changes to manage conditions that increase the risk of another TIA  or a stroke.  This information is not intended to replace advice given to you by your health care provider. Make sure you discuss any questions you have with your health care provider.  Document Released: 12/31/2004 Document Revised: 09/28/2017 Document Reviewed: 05/05/2016  [employer]vier Interactive Patient Education  2019 [employer]

## 2018-03-24 NOTE — Progress Notes (Signed)
Patient ID: Alexandria [name], female    DOB: Aug 15, 1945  Age: 72 y.o. MRN: [id number]    Subjective:  Subjective  HPI Alexandria [name] presents for IPF--- the dr at [facility] told her she may want to come off the plavix.  She has not seen neuro in a long time.   She is also having more reflux since starting the med for IPF.   She is on protonix but does not feel it is helping.    Review of Systems  Constitutional: Negative for chills and fever.  HENT: Negative for congestion and hearing loss.   Eyes: Negative for discharge.  Respiratory: Negative for cough and shortness of breath.   Cardiovascular: Negative for chest pain, palpitations and leg swelling.  Gastrointestinal: Negative for abdominal pain, blood in stool, constipation, diarrhea, nausea and vomiting.  Genitourinary: Negative for dysuria, frequency, hematuria and urgency.  Musculoskeletal: Negative for back pain and myalgias.  Skin: Negative for rash.  Allergic/Immunologic: Negative for environmental allergies.  Neurological: Negative for dizziness, weakness and headaches.  Hematological: Does not bruise/bleed easily.  Psychiatric/Behavioral: Negative for suicidal ideas. The patient is not nervous/anxious.     History Past Medical History:  Diagnosis Date  . Arthritis   . Asthmatic bronchitis with acute exacerbation 02/06/2015  . Bronchial pneumonia   . Colon polyp   . Diverticulitis   . DVT (deep venous thrombosis) ([place])    lower extremity  . H/O cardiac catheterization 2004   Normal coronary arteries  . Heart murmur   . History of stress test 05/21/2011  . Hx of echocardiogram 01/27/2010   Normal Ef 55% the transmitral spectral doppler flow pattern is normal for age. the left ventricular wall motion is normal  . Migraines   . Pancreatitis   . Thyroid disease   . TIA (transient ischemic attack)    8 years ago    She has a past surgical history that includes Vaginal hysterectomy (10/17/1998); Breast biopsy;  Total knee arthroplasty; Neck surgery; Cardiac catheterization; left heart catheterization with coronary angiogram (N/A, 10/25/2013); Video bronchoscopy (Bilateral, 01/24/2018); and Cataract extraction (Right, 03/22/2018).   Her family history includes Arthritis in an other family member; Colon cancer in her brother and father; Diabetes in her maternal grandmother; HIV in her brother; Heart disease in her maternal grandmother; Hypertension in her maternal grandmother; Kidney cancer in her brother; Lung cancer in her brother; Other in her brother; Ovarian cancer in her mother; Prostate cancer in her father; Stroke in her maternal grandmother; Uterine cancer in her mother.She reports that she quit smoking about 49 years ago. She quit after 2.00 years of use. She has never used smokeless tobacco. She reports that she does not drink alcohol or use drugs.  Current Outpatient Medications on File Prior to Visit  Medication Sig Dispense Refill  . benzonatate (TESSALON) 100 MG capsule Take 1 capsule (100 mg total) by mouth 3 (three) times daily as needed for cough. 90 capsule 2  . Biotin (RA BIOTIN) 1000 MCG tablet Take 1,000 mcg by mouth 2 (two) times daily.     . clopidogrel (PLAVIX) 75 MG tablet Take 1 tablet (75 mg total) by mouth daily. 30 tablet 0  . dicyclomine (BENTYL) 20 MG tablet TAKE 1 TABLET BY MOUTH FOUR TIMES A DAY AS NEEDED (Patient taking differently: Take 20 mg by mouth 4 (four) times daily as needed for spasms. ) 664 tablet 0  . folic acid (FOLVITE) 1 MG tablet Take 1 tablet (1 mg total) by  mouth daily. 30 tablet 0  . furosemide (LASIX) 40 MG tablet Take 1 tablet (40 mg total) by mouth daily. (Patient taking differently: Take 40 mg by mouth 2 (two) times daily. ) 30 tablet 0  . Multiple Vitamin (MULTIVITAMIN) tablet Take 1 tablet by mouth daily. Gluten free/Vegetarian Multivitamin    . Nintedanib (OFEV) 150 MG CAPS Take 150 mg by mouth 2 (two) times daily.    . Omega-3 Fatty Acids (OMEGA 3 PO)  Take 1 capsule by mouth every other day.    . pantoprazole (PROTONIX) 40 MG tablet Take 1 tablet (40 mg total) by mouth daily. 30 tablet 1  . PAPAYA ENZYME PO Take 1 capsule by mouth 2 (two) times daily.    . polyethylene glycol (MIRALAX / GLYCOLAX) packet Take 17 g by mouth daily. (Patient taking differently: Take 17 g by mouth at bedtime. ) 14 each 0  . potassium chloride (K-DUR,KLOR-CON) 10 MEQ tablet Take 1 tablet (10 mEq total) by mouth daily. (Patient taking differently: Take 10 mEq by mouth 2 (two) times daily. Take 3 times a day if needed per Dr. Etter Sjogren) 30 tablet 0  . SYNTHROID 112 MCG tablet TAKE 1 TABLET BY MOUTH EVERY DAY BEFORE BREAKFAST 90 tablet 1  . vitamin C (ASCORBIC ACID) 500 MG tablet Take 1,000 mg by mouth 2 (two) times daily.     . vitamin E 1000 UNIT capsule Take 1,000 Units by mouth daily.     No current facility-administered medications on file prior to visit.      Objective:  Objective  Physical Exam Vitals signs and nursing note reviewed.  Constitutional:      Appearance: She is well-developed.  HENT:     Head: Normocephalic and atraumatic.  Eyes:     Conjunctiva/sclera: Conjunctivae normal.  Neck:     Musculoskeletal: Normal range of motion and neck supple.     Thyroid: No thyromegaly.     Vascular: No carotid bruit or JVD.  Cardiovascular:     Rate and Rhythm: Normal rate and regular rhythm.     Heart sounds: Normal heart sounds. No murmur.  Pulmonary:     Effort: Pulmonary effort is normal. No respiratory distress.     Breath sounds: Normal breath sounds. No wheezing or rales.  Chest:     Chest wall: No tenderness.  Neurological:     Mental Status: She is alert and oriented to person, place, and time.    Wt 144 lb (65.3 kg)   SpO2 96%   BMI 24.77 kg/m  Wt Readings from Last 3 Encounters:  03/28/18 144 lb (65.3 kg)  03/10/18 139 lb (63 kg)  02/16/18 138 lb (62.6 kg)     Lab Results  Component Value Date   WBC 7.1 01/06/2018   HGB 14.0  01/06/2018   HCT 40.1 01/06/2018   PLT 238 01/06/2018   GLUCOSE 105 (H) 03/10/2018   CHOL 189 11/26/2017   TRIG 51.0 11/26/2017   HDL 61.60 11/26/2017   LDLCALC 117 (H) 11/26/2017   ALT 17 03/10/2018   AST 20 03/10/2018   NA 138 03/10/2018   K 3.4 (L) 03/10/2018   CL 98 03/10/2018   CREATININE 0.78 03/10/2018   BUN 12 03/10/2018   CO2 32 03/10/2018   TSH 0.62 11/26/2017   INR 1.1 (H) 05/07/2015    Dg Thoracic Spine 2 View  Result Date: 01/31/2018 CLINICAL DATA:  Back pain, injury EXAM: THORACIC SPINE 2 VIEWS COMPARISON:  CT chest 01/06/2018 FINDINGS:  Kyphoscoliosis unchanged. Multilevel disc degeneration with disc space narrowing and spurring. Negative for fracture. Atherosclerotic aortic arch. ACDF C6-7 with solid fusion and anterior plate IMPRESSION: Negative for thoracic fracture. Electronically Signed   By: Franchot Gallo M.D.   On: 01/31/2018 15:26   Dg Lumbar Spine 2-3 Views  Result Date: 01/31/2018 CLINICAL DATA:  Back injury.  Back pain EXAM: LUMBAR SPINE - 2-3 VIEW COMPARISON:  CT abdomen pelvis 08/25/2017 FINDINGS: Grade 1 anterolisthesis L3-4 L4-5 unchanged. Facet degeneration at L4-5 and L5-S1. Advanced disc degeneration T12-L1. Mild disc degeneration at L4-5 and L3-4. Generalized osteopenia Negative for fracture Atherosclerotic aorta Mild levoscoliosis at L1. IMPRESSION: Negative for fracture. Electronically Signed   By: Franchot Gallo M.D.   On: 01/31/2018 15:25     Assessment & Plan:  Plan  I have discontinued Alexandria Cortner. [name]'s fluticasone. I am also having her maintain her multivitamin, Biotin, dicyclomine, vitamin C, furosemide, polyethylene glycol, clopidogrel, folic acid, potassium chloride, vitamin E, Omega-3 Fatty Acids (OMEGA 3 PO), PAPAYA ENZYME PO, benzonatate, pantoprazole, SYNTHROID, and Nintedanib.  No orders of the defined types were placed in this encounter.   Problem List Items Addressed This Visit      Unprioritized   History of transient  ischemic attack (TIA)    On plavix---  [facility] pulm recommended pt come off Will refer to neuro  ? Aspirin       Relevant Orders   Ambulatory referral to Neurology   IPF (idiopathic pulmonary fibrosis) ([place])    Per pulmonary      Migraine without status migrainosus, not intractable - Primary    Refer to neuro       Relevant Orders   Ambulatory referral to Neurology      Follow-up: Return in about 6 months (around 09/23/2018), or if symptoms worsen or fail to improve.  [name], DO

## 2018-03-24 NOTE — Assessment & Plan Note (Signed)
Per pulmonary 

## 2018-03-24 NOTE — Assessment & Plan Note (Signed)
On plavix---  [facility] pulm recommended pt come off Will refer to neuro  ? Aspirin

## 2018-03-24 NOTE — Assessment & Plan Note (Signed)
Refer to neuro 

## 2018-03-25 DIAGNOSIS — J84112 Idiopathic pulmonary fibrosis: Secondary | ICD-10-CM | POA: Diagnosis not present

## 2018-03-28 DIAGNOSIS — J84112 Idiopathic pulmonary fibrosis: Secondary | ICD-10-CM | POA: Diagnosis not present

## 2018-04-04 DIAGNOSIS — J84112 Idiopathic pulmonary fibrosis: Secondary | ICD-10-CM | POA: Diagnosis not present

## 2018-04-06 DIAGNOSIS — J84112 Idiopathic pulmonary fibrosis: Secondary | ICD-10-CM | POA: Diagnosis not present

## 2018-04-11 ENCOUNTER — Other Ambulatory Visit: Payer: Self-pay | Admitting: Family Medicine

## 2018-04-11 DIAGNOSIS — J84112 Idiopathic pulmonary fibrosis: Secondary | ICD-10-CM | POA: Diagnosis not present

## 2018-04-13 DIAGNOSIS — J84112 Idiopathic pulmonary fibrosis: Secondary | ICD-10-CM | POA: Diagnosis not present

## 2018-04-15 DIAGNOSIS — J0141 Acute recurrent pansinusitis: Secondary | ICD-10-CM | POA: Diagnosis not present

## 2018-04-15 DIAGNOSIS — R52 Pain, unspecified: Secondary | ICD-10-CM | POA: Diagnosis not present

## 2018-04-15 DIAGNOSIS — Z87891 Personal history of nicotine dependence: Secondary | ICD-10-CM | POA: Diagnosis not present

## 2018-04-16 ENCOUNTER — Other Ambulatory Visit: Payer: Self-pay | Admitting: Family Medicine

## 2018-04-22 DIAGNOSIS — J84112 Idiopathic pulmonary fibrosis: Secondary | ICD-10-CM | POA: Diagnosis not present

## 2018-04-25 DIAGNOSIS — J84112 Idiopathic pulmonary fibrosis: Secondary | ICD-10-CM | POA: Diagnosis not present

## 2018-04-29 DIAGNOSIS — J84112 Idiopathic pulmonary fibrosis: Secondary | ICD-10-CM | POA: Diagnosis not present

## 2018-05-04 DIAGNOSIS — Z961 Presence of intraocular lens: Secondary | ICD-10-CM | POA: Diagnosis not present

## 2018-05-04 DIAGNOSIS — H16223 Keratoconjunctivitis sicca, not specified as Sjogren's, bilateral: Secondary | ICD-10-CM | POA: Diagnosis not present

## 2018-05-04 DIAGNOSIS — H527 Unspecified disorder of refraction: Secondary | ICD-10-CM | POA: Diagnosis not present

## 2018-05-04 DIAGNOSIS — H26492 Other secondary cataract, left eye: Secondary | ICD-10-CM | POA: Diagnosis not present

## 2018-05-06 DIAGNOSIS — J84112 Idiopathic pulmonary fibrosis: Secondary | ICD-10-CM | POA: Diagnosis not present

## 2018-05-09 DIAGNOSIS — J84112 Idiopathic pulmonary fibrosis: Secondary | ICD-10-CM | POA: Diagnosis not present

## 2018-05-10 ENCOUNTER — Other Ambulatory Visit: Payer: Self-pay | Admitting: Family Medicine

## 2018-05-10 MED ORDER — POTASSIUM CHLORIDE CRYS ER 10 MEQ PO TBCR: 10.0000 meq | EXTENDED_RELEASE_TABLET | Freq: Two times a day (BID) | ORAL | 5 refills | Status: DC

## 2018-05-10 NOTE — Telephone Encounter (Signed)
Requested medication (s) are due for refill today: yes  Requested medication (s) are on the active medication list: yes  Last refill:  01/10/18 #30   Future visit scheduled: no  Notes to clinic:  Last refill was by [place]. Please review    Requested Prescriptions  Pending Prescriptions Disp Refills   potassium chloride (K-DUR,KLOR-CON) 10 MEQ tablet 30 tablet 0    Sig: Take 1 tablet (10 mEq total) by mouth daily.     Endocrinology:  Minerals - Potassium Supplementation Failed - 05/10/2018 10:52 AM      Failed - K in normal range and within 360 days    Potassium  Date Value Ref Range Status  03/10/2018 3.4 (L) 3.5 - 5.1 mEq/L Final         Passed - Cr in normal range and within 360 days    Creat  Date Value Ref Range Status  06/11/2015 0.74 0.60 - 0.93 mg/dL Final   Creatinine, Ser  Date Value Ref Range Status  03/10/2018 0.78 0.40 - 1.20 mg/dL Final         Passed - Valid encounter within last 12 months    Recent Outpatient Visits          1 month ago Migraine without status migrainosus, not intractable, unspecified migraine type   [facility] [place], DO   3 months ago Hypokalemia   [facility] [place], DO   3 months ago    [facility] [place], DO   4 months ago [facility] [place], MD   4 months ago Hypokalemia   [facility] [place], DO      Future Appointments            In 1 month McQuaid, Ronie Spies, MD [facility]

## 2018-05-10 NOTE — Telephone Encounter (Signed)
Copied from [place] [id number]. Topic: Quick Communication - Rx Refill/Question >> May 10, 2018 10:45 AM Rayann Heman wrote: Medication: potassium chloride (K-DUR,KLOR-CON) 10 MEQ tablet [[id number]] 15 day supply  Has the patient contacted their pharmacy?yes faxed over  Preferred Pharmacy (with phone number or street name):[id number] - [place], [place] [phone] (Phone) [phone] (Fax)   Agent: Please be advised that RX refills may take up to 3 business days. We ask that you follow-up with your pharmacy.

## 2018-05-11 ENCOUNTER — Telehealth: Payer: Self-pay | Admitting: *Deleted

## 2018-05-11 NOTE — Telephone Encounter (Signed)
Copied from [place] [id number]. Topic: Referral - Question >> May 10, 2018 11:09 AM Alexandria [name] R wrote: Patient is calling wanting to know if her referral appt for Neurology can be sooner than whats scheduled

## 2018-05-12 ENCOUNTER — Telehealth: Payer: Self-pay | Admitting: Family Medicine

## 2018-05-12 ENCOUNTER — Other Ambulatory Visit: Payer: Self-pay | Admitting: *Deleted

## 2018-05-12 DIAGNOSIS — E559 Vitamin D deficiency, unspecified: Secondary | ICD-10-CM

## 2018-05-12 DIAGNOSIS — E039 Hypothyroidism, unspecified: Secondary | ICD-10-CM

## 2018-05-12 DIAGNOSIS — I252 Old myocardial infarction: Secondary | ICD-10-CM

## 2018-05-12 MED ORDER — POTASSIUM CHLORIDE CRYS ER 10 MEQ PO TBCR: 10.0000 meq | EXTENDED_RELEASE_TABLET | Freq: Two times a day (BID) | ORAL | 1 refills | Status: DC

## 2018-05-12 NOTE — Telephone Encounter (Signed)
Copied from [place] [id number]. Topic: Quick Communication - See Telephone Encounter >> May 12, 2018  4:27 PM Bea Graff, NT wrote: CRM for notification. See Telephone encounter for: 05/12/18. Pt requesting orders to have her vitamin D, liver, cholesterol, and a whole panel done. Please call pt to schedule once orders are placed.

## 2018-05-12 NOTE — Telephone Encounter (Signed)
Patient stated she is on the cancellation list and is scheduled for 05/31/18

## 2018-05-13 DIAGNOSIS — J84112 Idiopathic pulmonary fibrosis: Secondary | ICD-10-CM | POA: Diagnosis not present

## 2018-05-13 NOTE — Telephone Encounter (Signed)
Patient notified.  Orders placed and appt made.

## 2018-05-16 DIAGNOSIS — J329 Chronic sinusitis, unspecified: Secondary | ICD-10-CM | POA: Diagnosis not present

## 2018-05-16 DIAGNOSIS — H26491 Other secondary cataract, right eye: Secondary | ICD-10-CM | POA: Diagnosis not present

## 2018-05-17 ENCOUNTER — Other Ambulatory Visit (INDEPENDENT_AMBULATORY_CARE_PROVIDER_SITE_OTHER): Payer: Medicare Other

## 2018-05-17 DIAGNOSIS — E559 Vitamin D deficiency, unspecified: Secondary | ICD-10-CM | POA: Diagnosis not present

## 2018-05-17 DIAGNOSIS — E039 Hypothyroidism, unspecified: Secondary | ICD-10-CM | POA: Diagnosis not present

## 2018-05-17 DIAGNOSIS — I252 Old myocardial infarction: Secondary | ICD-10-CM | POA: Diagnosis not present

## 2018-05-17 LAB — COMPREHENSIVE METABOLIC PANEL
ALT: 17 U/L (ref 0–35)
AST: 17 U/L (ref 0–37)
Albumin: 3.7 g/dL (ref 3.5–5.2)
Alkaline Phosphatase: 66 U/L (ref 39–117)
BILIRUBIN TOTAL: 0.8 mg/dL (ref 0.2–1.2)
BUN: 12 mg/dL (ref 6–23)
CO2: 30 mEq/L (ref 19–32)
CREATININE: 0.64 mg/dL (ref 0.40–1.20)
Calcium: 9 mg/dL (ref 8.4–10.5)
Chloride: 103 mEq/L (ref 96–112)
GFR: 90.86 mL/min (ref >60.00)
Glucose, Bld: 95 mg/dL (ref 70–99)
Potassium: 3.6 mEq/L (ref 3.5–5.1)
Sodium: 141 mEq/L (ref 135–145)
Total Protein: 6.1 g/dL (ref 6.0–8.3)

## 2018-05-17 LAB — TSH: TSH: 3.49 u[IU]/mL (ref 0.35–4.50)

## 2018-05-17 LAB — LIPID PANEL
CHOL/HDL RATIO: 3
Cholesterol: 184 mg/dL (ref 0–200)
HDL: 55.3 mg/dL (ref >39.00)
LDL Cholesterol: 115 mg/dL — ABNORMAL HIGH (ref 0–99)
NonHDL: 128.3
Triglycerides: 68 mg/dL (ref 0.0–149.0)
VLDL: 13.6 mg/dL (ref 0.0–40.0)

## 2018-05-17 LAB — VITAMIN D 25 HYDROXY (VIT D DEFICIENCY, FRACTURES): VITD: 50.2 ng/mL (ref 30.00–100.00)

## 2018-05-23 ENCOUNTER — Encounter: Payer: Self-pay | Admitting: *Deleted

## 2018-05-23 ENCOUNTER — Telehealth: Payer: Self-pay | Admitting: *Deleted

## 2018-05-23 DIAGNOSIS — J84112 Idiopathic pulmonary fibrosis: Secondary | ICD-10-CM | POA: Diagnosis not present

## 2018-05-23 NOTE — Telephone Encounter (Signed)
ent does not tx migraines usually the neurologist does ---unless its all sinus

## 2018-05-23 NOTE — Telephone Encounter (Signed)
Patient asked about why was there a referral placed for neurology.  I advised that it was for migraine and she states that she sees ENT for that and she is canceling appointment  I saw this in the note  Problem List Items Addressed This Visit            Unprioritized    History of transient ischemic attack (TIA)     On plavix---  [facility] pulm recommended pt come off Will refer to neuro  ? Aspirin        Relevant Orders    Ambulatory referral to Neurology    IPF (idiopathic pulmonary fibrosis) ([place])     Per pulmonary       Migraine without status migrainosus, not intractable - Primary     Refer to neuro        Relevant Orders    Ambulatory referral to Neurology    She wanted to know who will manage her meds now since she has IPF and that plavix may cause internal bleeding?

## 2018-05-27 ENCOUNTER — Ambulatory Visit (INDEPENDENT_AMBULATORY_CARE_PROVIDER_SITE_OTHER): Payer: Medicare Other | Admitting: Family Medicine

## 2018-05-27 ENCOUNTER — Encounter: Payer: Self-pay | Admitting: Family Medicine

## 2018-05-27 ENCOUNTER — Ambulatory Visit: Payer: Medicare Other | Admitting: Family Medicine

## 2018-05-27 VITALS — BP 142/86 | HR 40 | Wt 138.0 lb

## 2018-05-27 DIAGNOSIS — J84112 Idiopathic pulmonary fibrosis: Secondary | ICD-10-CM | POA: Diagnosis not present

## 2018-05-27 DIAGNOSIS — J0140 Acute pansinusitis, unspecified: Secondary | ICD-10-CM | POA: Diagnosis not present

## 2018-05-27 MED ORDER — AMOXICILLIN-POT CLAVULANATE 875-125 MG PO TABS: 1.0000 | ORAL_TABLET | Freq: Two times a day (BID) | ORAL | 0 refills | Status: DC

## 2018-05-27 NOTE — Progress Notes (Signed)
Patient ID: Alexandria [name], female    DOB: 05-Dec-1945  Age: 73 y.o. MRN: [id number]    Subjective:  Subjective  HPI Alexandria [name] presents for sinus congestion that is not improving.  She was seen in urgent care and abx rx but pt did not take because it made her sick She still has sinus congestion   Review of Systems  Constitutional: Positive for chills. Negative for fever.  HENT: Positive for congestion, postnasal drip, rhinorrhea, sinus pressure and sinus pain.   Respiratory: Negative for cough, chest tightness, shortness of breath and wheezing.   Cardiovascular: Negative for chest pain, palpitations and leg swelling.  Allergic/Immunologic: Negative for environmental allergies.    History Past Medical History:  Diagnosis Date  . Arthritis   . Asthmatic bronchitis with acute exacerbation 02/06/2015  . Bronchial pneumonia   . Colon polyp   . Diverticulitis   . DVT (deep venous thrombosis) ([place])    lower extremity  . H/O cardiac catheterization 2004   Normal coronary arteries  . Heart murmur   . History of stress test 05/21/2011  . Hx of echocardiogram 01/27/2010   Normal Ef 55% the transmitral spectral doppler flow pattern is normal for age. the left ventricular wall motion is normal  . Migraines   . Pancreatitis   . Thyroid disease   . TIA (transient ischemic attack)    8 years ago    She has a past surgical history that includes Vaginal hysterectomy (10/17/1998); Breast biopsy; Total knee arthroplasty; Neck surgery; Cardiac catheterization; left heart catheterization with coronary angiogram (N/A, 10/25/2013); Video bronchoscopy (Bilateral, 01/24/2018); and Cataract extraction (Right, 03/22/2018).   Her family history includes Arthritis in an other family member; Colon cancer in her brother and father; Diabetes in her maternal grandmother; HIV in her brother; Heart disease in her maternal grandmother; Hypertension in her maternal grandmother; Kidney cancer in her  brother; Lung cancer in her brother; Other in her brother; Ovarian cancer in her mother; Prostate cancer in her father; Stroke in her maternal grandmother; Uterine cancer in her mother.She reports that she quit smoking about 49 years ago. She quit after 2.00 years of use. She has never used smokeless tobacco. She reports that she does not drink alcohol or use drugs.  Current Outpatient Medications on File Prior to Visit  Medication Sig Dispense Refill  . benzonatate (TESSALON) 100 MG capsule Take 1 capsule (100 mg total) by mouth 3 (three) times daily as needed for cough. 90 capsule 2  . Biotin (RA BIOTIN) 1000 MCG tablet Take 1,000 mcg by mouth 2 (two) times daily.     . clopidogrel (PLAVIX) 75 MG tablet TAKE 1 TABLET BY MOUTH EVERY DAY 90 tablet 1  . dicyclomine (BENTYL) 20 MG tablet TAKE 1 TABLET BY MOUTH FOUR TIMES A DAY AS NEEDED 409 tablet 1  . folic acid (FOLVITE) 1 MG tablet Take 1 tablet (1 mg total) by mouth daily. 30 tablet 0  . furosemide (LASIX) 40 MG tablet Take 1 tablet (40 mg total) by mouth daily. (Patient taking differently: Take 40 mg by mouth 2 (two) times daily. ) 30 tablet 0  . Multiple Vitamin (MULTIVITAMIN) tablet Take 1 tablet by mouth daily. Gluten free/Vegetarian Multivitamin    . Nintedanib (OFEV) 150 MG CAPS Take 150 mg by mouth 2 (two) times daily.    . Omega-3 Fatty Acids (OMEGA 3 PO) Take 1 capsule by mouth every other day.    . pantoprazole (PROTONIX) 40 MG tablet Take 1  tablet (40 mg total) by mouth daily. 30 tablet 1  . PAPAYA ENZYME PO Take 1 capsule by mouth 2 (two) times daily.    . polyethylene glycol (MIRALAX / GLYCOLAX) packet Take 17 g by mouth daily. (Patient taking differently: Take 17 g by mouth at bedtime. ) 14 each 0  . potassium chloride (K-DUR,KLOR-CON) 10 MEQ tablet Take 1 tablet (10 mEq total) by mouth 2 (two) times daily. Take 3 times a day if needed 180 tablet 1  . SYNTHROID 112 MCG tablet TAKE 1 TABLET BY MOUTH EVERY DAY BEFORE BREAKFAST 90  tablet 1  . vitamin C (ASCORBIC ACID) 500 MG tablet Take 1,000 mg by mouth 2 (two) times daily.     . vitamin E 1000 UNIT capsule Take 1,000 Units by mouth daily.     No current facility-administered medications on file prior to visit.      Objective:  Objective  Physical Exam Vitals signs and nursing note reviewed.  Constitutional:      Appearance: She is well-developed.  HENT:     Head: Normocephalic and atraumatic.     Right Ear: External ear normal.     Left Ear: External ear normal.     Nose:     Right Sinus: Maxillary sinus tenderness and frontal sinus tenderness present.     Left Sinus: Maxillary sinus tenderness and frontal sinus tenderness present.  Eyes:     General:        Right eye: No discharge.        Left eye: No discharge.     Conjunctiva/sclera: Conjunctivae normal.  Neck:     Musculoskeletal: Normal range of motion and neck supple.     Thyroid: No thyromegaly.     Vascular: No carotid bruit or JVD.  Cardiovascular:     Rate and Rhythm: Normal rate and regular rhythm.     Heart sounds: Normal heart sounds. No murmur.  Pulmonary:     Effort: Pulmonary effort is normal. No respiratory distress.     Breath sounds: Normal breath sounds. No wheezing or rales.  Chest:     Chest wall: No tenderness.  Lymphadenopathy:     Cervical: Cervical adenopathy present.  Neurological:     Mental Status: She is alert and oriented to person, place, and time.    BP (!) 142/86 (BP Location: Left Arm, Patient Position: Sitting, Cuff Size: Normal)   Pulse (!) 40   Wt 138 lb (62.6 kg)   SpO2 98%   BMI 23.73 kg/m  Wt Readings from Last 3 Encounters:  05/27/18 138 lb (62.6 kg)  03/28/18 144 lb (65.3 kg)  03/10/18 139 lb (63 kg)     Lab Results  Component Value Date   WBC 7.1 01/06/2018   HGB 14.0 01/06/2018   HCT 40.1 01/06/2018   PLT 238 01/06/2018   GLUCOSE 95 05/17/2018   CHOL 184 05/17/2018   TRIG 68.0 05/17/2018   HDL 55.30 05/17/2018   LDLCALC 115 (H)  05/17/2018   ALT 17 05/17/2018   AST 17 05/17/2018   NA 141 05/17/2018   K 3.6 05/17/2018   CL 103 05/17/2018   CREATININE 0.64 05/17/2018   BUN 12 05/17/2018   CO2 30 05/17/2018   TSH 3.49 05/17/2018   INR 1.1 (H) 05/07/2015    Dg Thoracic Spine 2 View  Result Date: 01/31/2018 CLINICAL DATA:  Back pain, injury EXAM: THORACIC SPINE 2 VIEWS COMPARISON:  CT chest 01/06/2018 FINDINGS: Kyphoscoliosis unchanged. Multilevel disc degeneration  with disc space narrowing and spurring. Negative for fracture. Atherosclerotic aortic arch. ACDF C6-7 with solid fusion and anterior plate IMPRESSION: Negative for thoracic fracture. Electronically Signed   By: Franchot Gallo M.D.   On: 01/31/2018 15:26   Dg Lumbar Spine 2-3 Views  Result Date: 01/31/2018 CLINICAL DATA:  Back injury.  Back pain EXAM: LUMBAR SPINE - 2-3 VIEW COMPARISON:  CT abdomen pelvis 08/25/2017 FINDINGS: Grade 1 anterolisthesis L3-4 L4-5 unchanged. Facet degeneration at L4-5 and L5-S1. Advanced disc degeneration T12-L1. Mild disc degeneration at L4-5 and L3-4. Generalized osteopenia Negative for fracture Atherosclerotic aorta Mild levoscoliosis at L1. IMPRESSION: Negative for fracture. Electronically Signed   By: Franchot Gallo M.D.   On: 01/31/2018 15:25     Assessment & Plan:  Plan  I am having Claris Gower. Sales start on amoxicillin-clavulanate. I am also having her maintain her multivitamin, Biotin, vitamin C, furosemide, polyethylene glycol, folic acid, vitamin E, Omega-3 Fatty Acids (OMEGA 3 PO), PAPAYA ENZYME PO, benzonatate, pantoprazole, SYNTHROID, Nintedanib, clopidogrel, dicyclomine, and potassium chloride.  Meds ordered this encounter  Medications  . amoxicillin-clavulanate (AUGMENTIN) 875-125 MG tablet    Sig: Take 1 tablet by mouth 2 (two) times daily.    Dispense:  20 tablet    Refill:  0    Problem List Items Addressed This Visit      Unprioritized   IPF (idiopathic pulmonary fibrosis) ([facility])   Relevant  Medications   amoxicillin-clavulanate (AUGMENTIN) 875-125 MG tablet    Other Visit Diagnoses    Acute non-recurrent pansinusitis    -  Primary   Relevant Medications   amoxicillin-clavulanate (AUGMENTIN) 875-125 MG tablet        Don't steroid nasal spray and saline nasal spray  Follow-up: Return if symptoms worsen or fail to improve.  [name], DO

## 2018-05-27 NOTE — Patient Instructions (Signed)
Sinusitis, Adult  Sinusitis is inflammation of your sinuses. Sinuses are hollow spaces in the bones around your face. Your sinuses are located:   Around your eyes.   In the middle of your forehead.   Behind your nose.   In your cheekbones.  Mucus normally drains out of your sinuses. When your nasal tissues become inflamed or swollen, mucus can become trapped or blocked. This allows bacteria, viruses, and fungi to grow, which leads to infection. Most infections of the sinuses are caused by a virus.  Sinusitis can develop quickly. It can last for up to 4 weeks (acute) or for more than 12 weeks (chronic). Sinusitis often develops after a cold.  What are the causes?  This condition is caused by anything that creates swelling in the sinuses or stops mucus from draining. This includes:   Allergies.   Asthma.   Infection from bacteria or viruses.   Deformities or blockages in your nose or sinuses.   Abnormal growths in the nose (nasal polyps).   Pollutants, such as chemicals or irritants in the air.   Infection from fungi (rare).  What increases the risk?  You are more likely to develop this condition if you:   Have a weak body defense system (immune system).   Do a lot of swimming or diving.   Overuse nasal sprays.   Smoke.  What are the signs or symptoms?  The main symptoms of this condition are pain and a feeling of pressure around the affected sinuses. Other symptoms include:   Stuffy nose or congestion.   Thick drainage from your nose.   Swelling and warmth over the affected sinuses.   Headache.   Upper toothache.   A cough that may get worse at night.   Extra mucus that collects in the throat or the back of the nose (postnasal drip).   Decreased sense of smell and taste.   Fatigue.   A fever.   Sore throat.   Bad breath.  How is this diagnosed?  This condition is diagnosed based on:   Your symptoms.   Your medical history.   A physical exam.   Tests to find out if your condition is  acute or chronic. This may include:  ? Checking your nose for nasal polyps.  ? Viewing your sinuses using a device that has a light (endoscope).  ? Testing for allergies or bacteria.  ? Imaging tests, such as an MRI or CT scan.  In rare cases, a bone biopsy may be done to rule out more serious types of fungal sinus disease.  How is this treated?  Treatment for sinusitis depends on the cause and whether your condition is chronic or acute.   If caused by a virus, your symptoms should go away on their own within 10 days. You may be given medicines to relieve symptoms. They include:  ? Medicines that shrink swollen nasal passages (topical intranasal decongestants).  ? Medicines that treat allergies (antihistamines).  ? A spray that eases inflammation of the nostrils (topical intranasal corticosteroids).  ? Rinses that help get rid of thick mucus in your nose (nasal saline washes).   If caused by bacteria, your health care provider may recommend waiting to see if your symptoms improve. Most bacterial infections will get better without antibiotic medicine. You may be given antibiotics if you have:  ? A severe infection.  ? A weak immune system.   If caused by narrow nasal passages or nasal polyps, you may need   to have surgery.  Follow these instructions at home:  Medicines   Take, use, or apply over-the-counter and prescription medicines only as told by your health care provider. These may include nasal sprays.   If you were prescribed an antibiotic medicine, take it as told by your health care provider. Do not stop taking the antibiotic even if you start to feel better.  Hydrate and humidify     Drink enough fluid to keep your urine pale yellow. Staying hydrated will help to thin your mucus.   Use a cool mist humidifier to keep the humidity level in your home above 50%.   Inhale steam for 10-15 minutes, 3-4 times a day, or as told by your health care provider. You can do this in the bathroom while a hot shower is  running.   Limit your exposure to cool or dry air.  Rest   Rest as much as possible.   Sleep with your head raised (elevated).   Make sure you get enough sleep each night.  General instructions     Apply a warm, moist washcloth to your face 3-4 times a day or as told by your health care provider. This will help with discomfort.   Wash your hands often with soap and water to reduce your exposure to germs. If soap and water are not available, use hand sanitizer.   Do not smoke. Avoid being around people who are smoking (secondhand smoke).   Keep all follow-up visits as told by your health care provider. This is important.  Contact a health care provider if:   You have a fever.   Your symptoms get worse.   Your symptoms do not improve within 10 days.  Get help right away if:   You have a severe headache.   You have persistent vomiting.   You have severe pain or swelling around your face or eyes.   You have vision problems.   You develop confusion.   Your neck is stiff.   You have trouble breathing.  Summary   Sinusitis is soreness and inflammation of your sinuses. Sinuses are hollow spaces in the bones around your face.   This condition is caused by nasal tissues that become inflamed or swollen. The swelling traps or blocks the flow of mucus. This allows bacteria, viruses, and fungi to grow, which leads to infection.   If you were prescribed an antibiotic medicine, take it as told by your health care provider. Do not stop taking the antibiotic even if you start to feel better.   Keep all follow-up visits as told by your health care provider. This is important.  This information is not intended to replace advice given to you by your health care provider. Make sure you discuss any questions you have with your health care provider.  Document Released: 03/23/2005 Document Revised: 08/23/2017 Document Reviewed: 08/23/2017  Elsevier Interactive Patient Education  2019 [employer]

## 2018-05-27 NOTE — Telephone Encounter (Signed)
Left message on machine.  Patient is in office today.

## 2018-05-30 DIAGNOSIS — J84112 Idiopathic pulmonary fibrosis: Secondary | ICD-10-CM | POA: Diagnosis not present

## 2018-05-31 ENCOUNTER — Ambulatory Visit: Payer: Medicare Other | Admitting: Neurology

## 2018-05-31 ENCOUNTER — Encounter

## 2018-06-07 DIAGNOSIS — R05 Cough: Secondary | ICD-10-CM | POA: Diagnosis not present

## 2018-06-08 DIAGNOSIS — J84112 Idiopathic pulmonary fibrosis: Secondary | ICD-10-CM | POA: Diagnosis not present

## 2018-06-09 ENCOUNTER — Other Ambulatory Visit: Payer: Self-pay | Admitting: Pulmonary Disease

## 2018-06-10 ENCOUNTER — Telehealth: Payer: Self-pay | Admitting: *Deleted

## 2018-06-10 DIAGNOSIS — J84112 Idiopathic pulmonary fibrosis: Secondary | ICD-10-CM | POA: Diagnosis not present

## 2018-06-10 NOTE — Telephone Encounter (Signed)
Just FYI she had to cancel appointment with Dr. Claiborne Billings on 3/10 because she had an appointment at the same time for pulmonology that same day.  She rescheduled for 3/24.

## 2018-06-10 NOTE — Telephone Encounter (Signed)
noted 

## 2018-06-10 NOTE — Telephone Encounter (Signed)
Copied from [place] [id number]. Topic: General - Other >> Jun 09, 2018 12:16 PM Mcneil, Ja-Kwan wrote: Reason for CRM: Pt requests a call back to discuss the appt with Dr. Claiborne Billings / [facility]

## 2018-06-13 DIAGNOSIS — J84112 Idiopathic pulmonary fibrosis: Secondary | ICD-10-CM | POA: Diagnosis not present

## 2018-06-14 ENCOUNTER — Ambulatory Visit (INDEPENDENT_AMBULATORY_CARE_PROVIDER_SITE_OTHER): Payer: Medicare Other | Admitting: *Deleted

## 2018-06-14 ENCOUNTER — Ambulatory Visit (INDEPENDENT_AMBULATORY_CARE_PROVIDER_SITE_OTHER): Payer: Medicare Other | Admitting: Pulmonary Disease

## 2018-06-14 ENCOUNTER — Ambulatory Visit: Payer: Medicare Other | Admitting: Physician Assistant

## 2018-06-14 ENCOUNTER — Encounter: Payer: Self-pay | Admitting: Pulmonary Disease

## 2018-06-14 VITALS — BP 128/80 | HR 65 | Ht 64.0 in | Wt 138.0 lb

## 2018-06-14 DIAGNOSIS — J84112 Idiopathic pulmonary fibrosis: Secondary | ICD-10-CM

## 2018-06-14 DIAGNOSIS — Z5181 Encounter for therapeutic drug level monitoring: Secondary | ICD-10-CM | POA: Diagnosis not present

## 2018-06-14 NOTE — Addendum Note (Signed)
Addended by: Jannette Spanner on: 06/14/2018 10:44 AM   Modules accepted: Orders

## 2018-06-14 NOTE — Progress Notes (Signed)
Synopsis: Former patient of Dr. Melvyn Novas with UACS and asthma who was found on chest imaging to have evidence of diffuse parenchymal lung disease.  She has [place] history of acid reflux and intolerance to antacids.  She treats her reflux with papaya. She says that she has had recurrent episodes of bronchitis on an annual basis for many years.  Subjective:   PATIENT ID: Alexandria [name] GENDER: female DOB: June 09, 1945, MRN: [id number]   HPI  Chief Complaint  Patient presents with  . Follow-up    f/u  ILD, doing well, no SOB    [name] has been well recently.  She is exercising quite [place] bit.  She has been lifting weights more with [place] physical therapist and feels that she is getting stronger.  She says that her shortness of breath has been improving.  She says that she does not have problems with cough.  She does not have problems with bronchitis right now.  Past Medical History:  Diagnosis Date  . Arthritis   . Asthmatic bronchitis with acute exacerbation 02/06/2015  . Bronchial pneumonia   . Colon polyp   . Diverticulitis   . DVT (deep venous thrombosis) ([place])    lower extremity  . H/O cardiac catheterization 2004   Normal coronary arteries  . Heart murmur   . History of stress test 05/21/2011  . Hx of echocardiogram 01/27/2010   Normal Ef 55% the transmitral spectral doppler flow pattern is normal for age. the left ventricular wall motion is normal  . Migraines   . Pancreatitis   . Thyroid disease   . TIA (transient ischemic attack)    8 years ago     Review of Systems  Constitutional: Negative for diaphoresis, fever and weight loss.  HENT: Negative for congestion, ear discharge and sinus pain.   Respiratory: Negative for cough, sputum production, wheezing and stridor.   Cardiovascular: Negative for chest pain, orthopnea and leg swelling.      Objective:  Physical Exam   Vitals:   06/14/18 0956  BP: 128/80  Pulse: 65  SpO2: 97%  Weight: 138 lb (62.6 kg)  Height:  5\' 4"  (1.626 m)    RA  Gen: well appearing HENT: OP clear, TM's clear, neck supple PULM: Crackles R base otherwise clear, normal percussion CV: RRR, no mgr, trace edema GI: BS+, soft, nontender Derm: no cyanosis or rash Psyche: normal mood and affect    CBC    Component Value Date/Time   WBC 7.1 01/06/2018 1324   RBC 4.26 01/06/2018 1324   HGB 14.0 01/06/2018 1324   HCT 40.1 01/06/2018 1324   PLT 238 01/06/2018 1324   MCV 94.1 01/06/2018 1324   MCH 32.9 01/06/2018 1324   MCHC 34.9 01/06/2018 1324   RDW 12.7 01/06/2018 1324   LYMPHSABS 2.8 01/06/2018 1324   MONOABS 0.9 01/06/2018 1324   EOSABS 0.4 01/06/2018 1324   BASOSABS 0.1 01/06/2018 1324     Chest imaging: September 2019 high-resolution CT scan of the chest considered probable UIP, differential diagnosis includes nonspecific interstitial pneumonitis.  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  ANA negative 01/2018 CBC with diff 400 eosinophils, ANCA test negative October 2019 bronchoscopy: Minimal white blood cells on cell count and differential, cytology showed minimal inflammatory change, just typical  macrophages.  No malignant change.  Cultures negative  6 Min walk 06/2018 537m, O2 saturation 96% RA  Path:  Echo:  Heart Catheterization:  FENO - FENO 11/30/2016  =   40 during flare off all rx   Records from her visit with our nurse practitioner in November reviewed where she had [place] cold.  Symptomatic therapy offered.    Assessment & Plan:   IPF (idiopathic pulmonary fibrosis) ([place])  Therapeutic drug monitoring  Discussion: She has been tolerating nintedanib well.  I am very pleased with her 6-minute walk distance today.  Idiopathic pulmonary fibrosis: Continue nintedanib as you are doing We will check [place] lung function test to assess disease activity Practice good hand hygiene Stay physically active We will continue to check [place] comprehensive metabolic panel and liver function test every 3 months, the next will be arranged for May  We will see you back in 3 months or sooner if needed   Current Outpatient Medications:  .  amoxicillin-clavulanate (AUGMENTIN) 875-125 MG tablet, Take 1 tablet by mouth 2 (two) times daily., Disp: 20 tablet, Rfl: 0 .  benzonatate (TESSALON) 100 MG capsule, TAKE 1 CAPSULE BY MOUTH THREE TIMES [place] DAY AS NEEDED FOR COUGH, Disp: 90 capsule, Rfl: 1 .  Biotin (RA BIOTIN) 1000 MCG tablet, Take 1,000 mcg by mouth 2 (two) times daily. , Disp: , Rfl:  .  clopidogrel (PLAVIX) 75 MG tablet, TAKE 1 TABLET BY MOUTH EVERY DAY, Disp: 90 tablet, Rfl: 1 .  dicyclomine (BENTYL) 20 MG tablet, TAKE 1 TABLET BY MOUTH FOUR TIMES [place] DAY AS NEEDED, Disp: 360 tablet, Rfl: 1 .  folic acid (FOLVITE) 1 MG tablet, Take 1 tablet (1 mg total) by mouth daily., Disp: 30 tablet, Rfl: 0 .  furosemide (LASIX) 40 MG tablet, Take 1 tablet (40 mg total) by mouth daily. (Patient taking differently: Take 40 mg by mouth 2 (two) times daily. ), Disp: 30 tablet, Rfl: 0 .  Multiple Vitamin (MULTIVITAMIN) tablet, Take 1 tablet by mouth daily. Gluten free/Vegetarian Multivitamin, Disp: , Rfl:  .   Nintedanib (OFEV) 150 MG CAPS, Take 150 mg by mouth 2 (two) times daily., Disp: , Rfl:  .  Omega-3 Fatty Acids (OMEGA 3 PO), Take 1 capsule by mouth every other day., Disp: , Rfl:  .  pantoprazole (PROTONIX) 40 MG tablet, Take 1 tablet (40 mg total) by mouth daily., Disp: 30 tablet, Rfl: 1 .  PAPAYA ENZYME PO, Take 1 capsule by mouth 2 (two) times daily., Disp: , Rfl:  .  polyethylene glycol (MIRALAX / GLYCOLAX) packet, Take 17 g by mouth daily. (Patient taking differently: Take 17 g by mouth at bedtime. ), Disp: 14 each, Rfl: 0 .  potassium chloride (K-DUR,KLOR-CON) 10 MEQ tablet, Take 1 tablet (10 mEq total) by mouth 2 (two) times daily. Take 3 times [place] day if needed, Disp: 180 tablet, Rfl: 1 .  SYNTHROID 112 MCG tablet, TAKE 1 TABLET BY MOUTH EVERY  DAY BEFORE BREAKFAST, Disp: 90 tablet, Rfl: 1 .  vitamin C (ASCORBIC ACID) 500 MG tablet, Take 1,000 mg by mouth 2 (two) times daily. , Disp: , Rfl:  .  vitamin E 1000 UNIT capsule, Take 1,000 Units by mouth daily., Disp: , Rfl:

## 2018-06-14 NOTE — Patient Instructions (Signed)
Idiopathic pulmonary fibrosis: Continue nintedanib as you are doing We will check a lung function test to assess disease activity Practice good hand hygiene Stay physically active We will continue to check a comprehensive metabolic panel and liver function test every 3 months, the next will be arranged for May  We will see you back in 3 months or sooner if needed

## 2018-06-14 NOTE — Progress Notes (Signed)
SIX MIN WALK 06/14/2018  Medications Synthroid 112mcg @ 0730, OFEV 150mg  @ 0745, Papaya @ 0800 and 0830, Tessalon 100mg  @ 0815, Plavix 75mg  @ 0830  Supplimental Oxygen during Test? (L/min) No  Laps 15  Partial Lap (in Meters) 20  Baseline BP (sitting) 134/84  Baseline Heartrate 67  Baseline Dyspnea (Borg Scale) 0  Baseline Fatigue (Borg Scale) 1  Baseline SPO2 97  BP (sitting) 150/86  Heartrate 45  Dyspnea (Borg Scale) 0  Fatigue (Borg Scale) 0.5  SPO2 96  BP (sitting) 138/80  Heartrate 75  SPO2 96  Stopped or Paused before Six Minutes No  Distance Completed 530  Tech Comments: patient completed test at brisk pace, no rest breaks.  patient denied any symptoms of dyspnea, angina, dizzines, hip/leg/calf pain at end of test

## 2018-06-15 DIAGNOSIS — R0981 Nasal congestion: Secondary | ICD-10-CM | POA: Diagnosis not present

## 2018-06-15 DIAGNOSIS — K048 Radicular cyst: Secondary | ICD-10-CM | POA: Diagnosis not present

## 2018-06-15 DIAGNOSIS — J3489 Other specified disorders of nose and nasal sinuses: Secondary | ICD-10-CM | POA: Diagnosis not present

## 2018-06-15 DIAGNOSIS — R51 Headache: Secondary | ICD-10-CM | POA: Diagnosis not present

## 2018-06-15 DIAGNOSIS — R05 Cough: Secondary | ICD-10-CM | POA: Diagnosis not present

## 2018-06-23 ENCOUNTER — Ambulatory Visit (INDEPENDENT_AMBULATORY_CARE_PROVIDER_SITE_OTHER): Payer: Medicare Other | Admitting: Family Medicine

## 2018-06-23 ENCOUNTER — Other Ambulatory Visit: Payer: Self-pay

## 2018-06-23 ENCOUNTER — Encounter: Payer: Self-pay | Admitting: Family Medicine

## 2018-06-23 VITALS — BP 128/80 | HR 80 | Temp 98.2°F | Resp 12 | Ht 64.4 in | Wt 138.2 lb

## 2018-06-23 DIAGNOSIS — J84112 Idiopathic pulmonary fibrosis: Secondary | ICD-10-CM | POA: Diagnosis not present

## 2018-06-23 DIAGNOSIS — J324 Chronic pansinusitis: Secondary | ICD-10-CM | POA: Insufficient documentation

## 2018-06-23 DIAGNOSIS — G43809 Other migraine, not intractable, without status migrainosus: Secondary | ICD-10-CM

## 2018-06-23 MED ORDER — KETOROLAC TROMETHAMINE 60 MG/2ML IM SOLN
60.0000 mg | Freq: Once | INTRAMUSCULAR | Status: AC
  Administered 2018-06-23: 60 mg via INTRAMUSCULAR

## 2018-06-23 MED ORDER — AMOXICILLIN-POT CLAVULANATE 875-125 MG PO TABS: 1.0000 | ORAL_TABLET | Freq: Two times a day (BID) | ORAL | 0 refills | Status: DC

## 2018-06-23 MED ORDER — NINTEDANIB ESYLATE 150 MG PO CAPS: 150.0000 mg | ORAL_CAPSULE | Freq: Two times a day (BID) | ORAL | Status: DC

## 2018-06-23 NOTE — Assessment & Plan Note (Signed)
con't flonase / saline spray abx per orders F/u prn

## 2018-06-23 NOTE — Assessment & Plan Note (Signed)
toradol given in office Sinusitis also contributing I'm sure

## 2018-06-23 NOTE — Patient Instructions (Signed)
Sinusitis, Adult  Sinusitis is inflammation of your sinuses. Sinuses are hollow spaces in the bones around your face. Your sinuses are located:   Around your eyes.   In the middle of your forehead.   Behind your nose.   In your cheekbones.  Mucus normally drains out of your sinuses. When your nasal tissues become inflamed or swollen, mucus can become trapped or blocked. This allows bacteria, viruses, and fungi to grow, which leads to infection. Most infections of the sinuses are caused by a virus.  Sinusitis can develop quickly. It can last for up to 4 weeks (acute) or for more than 12 weeks (chronic). Sinusitis often develops after a cold.  What are the causes?  This condition is caused by anything that creates swelling in the sinuses or stops mucus from draining. This includes:   Allergies.   Asthma.   Infection from bacteria or viruses.   Deformities or blockages in your nose or sinuses.   Abnormal growths in the nose (nasal polyps).   Pollutants, such as chemicals or irritants in the air.   Infection from fungi (rare).  What increases the risk?  You are more likely to develop this condition if you:   Have a weak body defense system (immune system).   Do a lot of swimming or diving.   Overuse nasal sprays.   Smoke.  What are the signs or symptoms?  The main symptoms of this condition are pain and a feeling of pressure around the affected sinuses. Other symptoms include:   Stuffy nose or congestion.   Thick drainage from your nose.   Swelling and warmth over the affected sinuses.   Headache.   Upper toothache.   A cough that may get worse at night.   Extra mucus that collects in the throat or the back of the nose (postnasal drip).   Decreased sense of smell and taste.   Fatigue.   A fever.   Sore throat.   Bad breath.  How is this diagnosed?  This condition is diagnosed based on:   Your symptoms.   Your medical history.   A physical exam.   Tests to find out if your condition is  acute or chronic. This may include:  ? Checking your nose for nasal polyps.  ? Viewing your sinuses using a device that has a light (endoscope).  ? Testing for allergies or bacteria.  ? Imaging tests, such as an MRI or CT scan.  In rare cases, a bone biopsy may be done to rule out more serious types of fungal sinus disease.  How is this treated?  Treatment for sinusitis depends on the cause and whether your condition is chronic or acute.   If caused by a virus, your symptoms should go away on their own within 10 days. You may be given medicines to relieve symptoms. They include:  ? Medicines that shrink swollen nasal passages (topical intranasal decongestants).  ? Medicines that treat allergies (antihistamines).  ? A spray that eases inflammation of the nostrils (topical intranasal corticosteroids).  ? Rinses that help get rid of thick mucus in your nose (nasal saline washes).   If caused by bacteria, your health care provider may recommend waiting to see if your symptoms improve. Most bacterial infections will get better without antibiotic medicine. You may be given antibiotics if you have:  ? A severe infection.  ? A weak immune system.   If caused by narrow nasal passages or nasal polyps, you may need   to have surgery.  Follow these instructions at home:  Medicines   Take, use, or apply over-the-counter and prescription medicines only as told by your health care provider. These may include nasal sprays.   If you were prescribed an antibiotic medicine, take it as told by your health care provider. Do not stop taking the antibiotic even if you start to feel better.  Hydrate and humidify     Drink enough fluid to keep your urine pale yellow. Staying hydrated will help to thin your mucus.   Use a cool mist humidifier to keep the humidity level in your home above 50%.   Inhale steam for 10-15 minutes, 3-4 times a day, or as told by your health care provider. You can do this in the bathroom while a hot shower is  running.   Limit your exposure to cool or dry air.  Rest   Rest as much as possible.   Sleep with your head raised (elevated).   Make sure you get enough sleep each night.  General instructions     Apply a warm, moist washcloth to your face 3-4 times a day or as told by your health care provider. This will help with discomfort.   Wash your hands often with soap and water to reduce your exposure to germs. If soap and water are not available, use hand sanitizer.   Do not smoke. Avoid being around people who are smoking (secondhand smoke).   Keep all follow-up visits as told by your health care provider. This is important.  Contact a health care provider if:   You have a fever.   Your symptoms get worse.   Your symptoms do not improve within 10 days.  Get help right away if:   You have a severe headache.   You have persistent vomiting.   You have severe pain or swelling around your face or eyes.   You have vision problems.   You develop confusion.   Your neck is stiff.   You have trouble breathing.  Summary   Sinusitis is soreness and inflammation of your sinuses. Sinuses are hollow spaces in the bones around your face.   This condition is caused by nasal tissues that become inflamed or swollen. The swelling traps or blocks the flow of mucus. This allows bacteria, viruses, and fungi to grow, which leads to infection.   If you were prescribed an antibiotic medicine, take it as told by your health care provider. Do not stop taking the antibiotic even if you start to feel better.   Keep all follow-up visits as told by your health care provider. This is important.  This information is not intended to replace advice given to you by your health care provider. Make sure you discuss any questions you have with your health care provider.  Document Released: 03/23/2005 Document Revised: 08/23/2017 Document Reviewed: 08/23/2017  Elsevier Interactive Patient Education  2019 [employer]

## 2018-06-23 NOTE — Progress Notes (Signed)
Patient ID: Alexandria [name], female    DOB: [birth year]  Age: 73 y.o. MRN: [id number]    Subjective:  Subjective  HPI [place] [name] presents for sinus pressure , congestion and migraine x 1 week    She has not taken anything   Her head has hurt so bad she could finish getting her hair done last week.    Review of Systems  Constitutional: Negative for appetite change, chills, diaphoresis, fatigue, fever and unexpected weight change.  HENT: Positive for congestion, postnasal drip, rhinorrhea, sinus pressure, sinus pain and sneezing.   Eyes: Negative for pain, redness and visual disturbance.  Respiratory: Negative for cough, chest tightness, shortness of breath and wheezing.   Cardiovascular: Negative for chest pain, palpitations and leg swelling.  Endocrine: Negative for cold intolerance, heat intolerance, polydipsia, polyphagia and polyuria.  Genitourinary: Negative for difficulty urinating, dysuria and frequency.  Allergic/Immunologic: Negative for environmental allergies.  Neurological: Positive for headaches. Negative for dizziness, light-headedness and numbness.    History Past Medical History:  Diagnosis Date  . Arthritis   . Asthmatic bronchitis with acute exacerbation 02/06/2015  . Bronchial pneumonia   . Colon polyp   . Diverticulitis   . DVT (deep venous thrombosis) ([place])    lower extremity  . H/O cardiac catheterization 2004   Normal coronary arteries  . Heart murmur   . History of stress test 05/21/2011  . Hx of echocardiogram 01/27/2010   Normal Ef 55% the transmitral spectral doppler flow pattern is normal for age. the left ventricular wall motion is normal  . Migraines   . Pancreatitis   . Thyroid disease   . TIA (transient ischemic attack)    8 years ago    She has a past surgical history that includes Vaginal hysterectomy (10/17/1998); Breast biopsy; Total knee arthroplasty; Neck surgery; Cardiac catheterization; left heart catheterization with  coronary angiogram (N/A, 10/25/2013); Video bronchoscopy (Bilateral, 01/24/2018); and Cataract extraction (Right, 03/22/2018).   Her family history includes Arthritis in an other family member; Colon cancer in her brother and father; Diabetes in her maternal grandmother; HIV in her brother; Heart disease in her maternal grandmother; Hypertension in her maternal grandmother; Kidney cancer in her brother; Lung cancer in her brother; Other in her brother; Ovarian cancer in her mother; Prostate cancer in her father; Stroke in her maternal grandmother; Uterine cancer in her mother.She reports that she quit smoking about 49 years ago. She quit after 2.00 years of use. She has never used smokeless tobacco. She reports that she does not drink alcohol or use drugs.  Current Outpatient Medications on File Prior to Visit  Medication Sig Dispense Refill  . benzonatate (TESSALON) 100 MG capsule TAKE 1 CAPSULE BY MOUTH THREE TIMES A DAY AS NEEDED FOR COUGH 90 capsule 1  . Biotin (RA BIOTIN) 1000 MCG tablet Take 1,000 mcg by mouth 2 (two) times daily.     . clopidogrel (PLAVIX) 75 MG tablet TAKE 1 TABLET BY MOUTH EVERY DAY 90 tablet 1  . dicyclomine (BENTYL) 20 MG tablet TAKE 1 TABLET BY MOUTH FOUR TIMES A DAY AS NEEDED 360 tablet 1  . furosemide (LASIX) 40 MG tablet Take 1 tablet (40 mg total) by mouth daily. (Patient taking differently: Take 40 mg by mouth 2 (two) times daily. ) 30 tablet 0  . KLOR-CON M20 20 MEQ tablet Take 20 mEq by mouth 2 (two) times daily.    . Multiple Vitamin (MULTIVITAMIN) tablet Take 1 tablet by mouth daily. Gluten free/Vegetarian Multivitamin    .  Nintedanib (OFEV) 150 MG CAPS Take 150 mg by mouth 2 (two) times daily.    Marland Kitchen SYNTHROID 112 MCG tablet TAKE 1 TABLET BY MOUTH EVERY DAY BEFORE BREAKFAST 90 tablet 1  . vitamin C (ASCORBIC ACID) 500 MG tablet Take 1,000 mg by mouth 2 (two) times daily.     . vitamin E 1000 UNIT capsule Take 1,000 Units by mouth daily.     No current  facility-administered medications on file prior to visit.      Objective:  Objective  Physical Exam Vitals signs and nursing note reviewed.  Constitutional:      Appearance: She is diaphoretic.  HENT:     Nose: Mucosal edema and rhinorrhea present. No nasal deformity.     Right Sinus: Maxillary sinus tenderness and frontal sinus tenderness present.     Left Sinus: Maxillary sinus tenderness and frontal sinus tenderness present.     Mouth/Throat:     Pharynx: No oropharyngeal exudate.  Neck:     Musculoskeletal: Normal range of motion and neck supple.  Cardiovascular:     Rate and Rhythm: Normal rate and regular rhythm.     Heart sounds: Normal heart sounds. No murmur.  Pulmonary:     Effort: Pulmonary effort is normal. No respiratory distress.     Breath sounds: Normal breath sounds. No rales.  Lymphadenopathy:     Cervical: No cervical adenopathy.  Skin:    General: Skin is warm.  Neurological:     Mental Status: She is alert and oriented to person, place, and time.    BP 128/80 (BP Location: Right Arm, Cuff Size: Normal)   Pulse 80   Temp 98.2 F (36.8 C) (Oral)   Resp 12   Ht 5' 4.4" (1.636 m)   Wt 138 lb 3.2 oz (62.7 kg)   SpO2 97%   BMI 23.43 kg/m  Wt Readings from Last 3 Encounters:  06/23/18 138 lb 3.2 oz (62.7 kg)  06/14/18 138 lb (62.6 kg)  05/27/18 138 lb (62.6 kg)     Lab Results  Component Value Date   WBC 7.1 01/06/2018   HGB 14.0 01/06/2018   HCT 40.1 01/06/2018   PLT 238 01/06/2018   GLUCOSE 95 05/17/2018   CHOL 184 05/17/2018   TRIG 68.0 05/17/2018   HDL 55.30 05/17/2018   LDLCALC 115 (H) 05/17/2018   ALT 17 05/17/2018   AST 17 05/17/2018   NA 141 05/17/2018   K 3.6 05/17/2018   CL 103 05/17/2018   CREATININE 0.64 05/17/2018   BUN 12 05/17/2018   CO2 30 05/17/2018   TSH 3.49 05/17/2018   INR 1.1 (H) 05/07/2015    Dg Thoracic Spine 2 View  Result Date: 01/31/2018 CLINICAL DATA:  Back pain, injury EXAM: THORACIC SPINE 2 VIEWS  COMPARISON:  CT chest 01/06/2018 FINDINGS: Kyphoscoliosis unchanged. Multilevel disc degeneration with disc space narrowing and spurring. Negative for fracture. Atherosclerotic aortic arch. ACDF C6-7 with solid fusion and anterior plate IMPRESSION: Negative for thoracic fracture. Electronically Signed   By: Franchot Gallo M.D.   On: 01/31/2018 15:26   Dg Lumbar Spine 2-3 Views  Result Date: 01/31/2018 CLINICAL DATA:  Back injury.  Back pain EXAM: LUMBAR SPINE - 2-3 VIEW COMPARISON:  CT abdomen pelvis 08/25/2017 FINDINGS: Grade 1 anterolisthesis L3-4 L4-5 unchanged. Facet degeneration at L4-5 and L5-S1. Advanced disc degeneration T12-L1. Mild disc degeneration at L4-5 and L3-4. Generalized osteopenia Negative for fracture Atherosclerotic aorta Mild levoscoliosis at L1. IMPRESSION: Negative for fracture. Electronically Signed  By: Franchot Gallo M.D.   On: 01/31/2018 15:25     Assessment & Plan:  Plan  I have discontinued Claris Gower. Son's polyethylene glycol, folic acid, Omega-3 Fatty Acids (OMEGA 3 PO), PAPAYA ENZYME PO, pantoprazole, potassium chloride, and amoxicillin-clavulanate. I am also having her start on Nintedanib and amoxicillin-clavulanate. Additionally, I am having her maintain her multivitamin, Biotin, vitamin C, furosemide, vitamin E, Synthroid, Nintedanib, clopidogrel, dicyclomine, benzonatate, and Klor-Con M20. We administered ketorolac.  Meds ordered this encounter  Medications  . Nintedanib (OFEV) 150 MG CAPS    Sig: Take 1 capsule (150 mg total) by mouth 2 (two) times daily.    Dispense:  60 capsule  . amoxicillin-clavulanate (AUGMENTIN) 875-125 MG tablet    Sig: Take 1 tablet by mouth 2 (two) times daily.    Dispense:  20 tablet    Refill:  0  . ketorolac (TORADOL) injection 60 mg    Problem List Items Addressed This Visit      Unprioritized   Migraine without status migrainosus, not intractable    toradol given in office Sinusitis also contributing I'm sure       Relevant Medications   ketorolac (TORADOL) injection 60 mg (Completed)   Pansinusitis - Primary    con't flonase / saline spray abx per orders F/u prn       Relevant Medications   amoxicillin-clavulanate (AUGMENTIN) 875-125 MG tablet    Other Visit Diagnoses    Idiopathic pulmonary fibrosis ([facility])       Relevant Medications   Nintedanib (OFEV) 150 MG CAPS   amoxicillin-clavulanate (AUGMENTIN) 875-125 MG tablet      Follow-up: Return if symptoms worsen or fail to improve.  [name], DO

## 2018-06-24 ENCOUNTER — Telehealth: Payer: Self-pay | Admitting: Cardiovascular Disease

## 2018-06-24 NOTE — Telephone Encounter (Signed)
   Spoke with pt who state her appointment was cancelled for 3/24 with Dr. Claiborne Billings. 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 She report the medication she's currently taking for it called Ofve causes a risk for heart attack and she would like to make sure she is ok. She also states she has been taking Plavix for a while and wanted to make sure that was ok as well. She currently denies any symptoms related to the cardiac questionnaire. Will route to MD for recommendations.  Cardiac Questionnaire:    Since your last visit or hospitalization:    1. Have you been having new or worsening chest pain? No   2. Have you been having new or worsening shortness of breath? No 3. Have you been having new or worsening leg swelling, wt gain, or increase in abdominal girth (pants fitting more tightly)? No   4. Have you had any passing out spells? No    *A YES to any of these questions would result in the appointment being kept. *If all the answers to these questions are NO, we should indicate that given the current situation regarding the worldwide coronarvirus pandemic, at the recommendation of the CDC, we are looking to limit gatherings in our waiting area, and thus will reschedule their appointment beyond four weeks from today.   _____________   [id number] Pre-Screening Questions:  . Do you currently have a fever? No (yes = cancel and refer to pcp for e-visit) . Have you recently travelled on a cruise, internationally, or to [place], [place], [place], [place], [place], or [place], [place] [employer]) ? No (yes = cancel, stay home, monitor symptoms, and contact pcp or initiate e-visit if symptoms develop) . Have you been in contact with someone that is currently pending confirmation of Covid19 testing or has been confirmed to have the [place] virus?  No (yes = cancel, stay home, away from tested individual, monitor symptoms, and contact pcp or  initiate e-visit if symptoms develop) Are you currently experiencing fatigue or cough? Yes but state it's related to IPF

## 2018-06-24 NOTE — Telephone Encounter (Signed)
New Message:   Patient calling back concering her appt.The appt was cancel but patient stating that she has been waiting for along time. Please call patient.

## 2018-06-27 NOTE — Telephone Encounter (Signed)
Called patient, spoke with patient regarding her upcoming appointment that was cancelled.  Patient was aware to continue medications as of now, but to call if any other concerns.

## 2018-06-27 NOTE — Telephone Encounter (Signed)
Okay to continue current medications.  Since she denies any recent cardiac symptoms, will reschedule cardiology evaluation in several months.

## 2018-06-28 ENCOUNTER — Ambulatory Visit: Payer: Medicare Other | Admitting: Cardiovascular Disease

## 2018-06-29 DIAGNOSIS — H1045 Other chronic allergic conjunctivitis: Secondary | ICD-10-CM | POA: Diagnosis not present

## 2018-06-29 DIAGNOSIS — H43812 Vitreous degeneration, left eye: Secondary | ICD-10-CM | POA: Diagnosis not present

## 2018-07-21 ENCOUNTER — Ambulatory Visit: Payer: Self-pay | Admitting: *Deleted

## 2018-07-21 NOTE — Telephone Encounter (Signed)
Pt reports lightheadedness, not presently. States ongoing issue "Dr. Carollee Herter is aware of."States positional, not presently but "Rears it's head once in a while.' Pt is calling to request blood work. States Dr. Carollee Herter usually checks her K+ when this occurs. Also questioning if she should "Stop Lasix" States dose was decreased from 2 tabs to 1. States has tried to reach cardiologist as Dr. Carollee Herter advised. States "He said he reviewed chart and everything was fine but I haven't seen him in 2 years." States has also been speaking with clinical nurses with [facility] trial. [place] attempted to reach practice, unable to reach. Pt's Email and phone number verified. Pt is not presently experiencing dizziness; instructed to CB if reoccurs. Please advise regarding appt. CB # [id number]  Reason for Disposition . [1] MILD dizziness (e.g., walking normally) AND [2] has been evaluated by physician for this  Answer Assessment - Initial Assessment Questions 1. DESCRIPTION: "Describe your dizziness."     *No Answer* 2. LIGHTHEADED: "Do you feel lightheaded?" (e.g., somewhat faint, woozy, weak upon standing)     *No Answer* 3. VERTIGO: "Do you feel like either you or the room is spinning or tilting?" (i.e. vertigo)     *No Answer* 4. SEVERITY: "How bad is it?"  "Do you feel like you are going to faint?" "Can you stand and walk?"   - MILD - walking normally   - MODERATE - interferes with normal activities (e.g., work, school)    - SEVERE - unable to stand, requires support to walk, feels like passing out now.      *No Answer* 5. ONSET:  "When did the dizziness begin?"     *No Answer* 6. AGGRAVATING FACTORS: "Does anything make it worse?" (e.g., standing, change in head position)     *No Answer* 7. HEART RATE: "Can you tell me your heart rate?" "How many beats in 15 seconds?"  (Note: not all patients can do this)       *No Answer* 8. CAUSE: "What do you think is causing the dizziness?"     *No Answer* 9.  RECURRENT SYMPTOM: "Have you had dizziness before?" If so, ask: "When was the last time?" "What happened that time?"     *No Answer* 10. OTHER SYMPTOMS: "Do you have any other symptoms?" (e.g., fever, chest pain, vomiting, diarrhea, bleeding)       *No Answer* 11. PREGNANCY: "Is there any chance you are pregnant?" "When was your last menstrual period?"       *No Answer*  Protocols used: DIZZINESS Heidi Dach

## 2018-07-22 ENCOUNTER — Encounter: Payer: Self-pay | Admitting: Family Medicine

## 2018-07-22 ENCOUNTER — Ambulatory Visit (INDEPENDENT_AMBULATORY_CARE_PROVIDER_SITE_OTHER): Payer: Medicare Other | Admitting: Family Medicine

## 2018-07-22 ENCOUNTER — Encounter: Payer: Medicare Other | Admitting: Family Medicine

## 2018-07-22 ENCOUNTER — Other Ambulatory Visit: Payer: Self-pay

## 2018-07-22 VITALS — BP 145/69 | HR 42 | Temp 98.4°F | Resp 12 | Ht 64.4 in | Wt 138.0 lb

## 2018-07-22 DIAGNOSIS — R42 Dizziness and giddiness: Secondary | ICD-10-CM

## 2018-07-22 DIAGNOSIS — J0140 Acute pansinusitis, unspecified: Secondary | ICD-10-CM | POA: Diagnosis not present

## 2018-07-22 DIAGNOSIS — R001 Bradycardia, unspecified: Secondary | ICD-10-CM

## 2018-07-22 DIAGNOSIS — R5383 Other fatigue: Secondary | ICD-10-CM | POA: Diagnosis not present

## 2018-07-22 LAB — CBC WITH DIFFERENTIAL/PLATELET
Basophils Absolute: 0.1 10*3/uL (ref 0.0–0.1)
Basophils Relative: 1.9 % (ref 0.0–3.0)
Eosinophils Absolute: 0.5 10*3/uL (ref 0.0–0.7)
Eosinophils Relative: 8.2 % — ABNORMAL HIGH (ref 0.0–5.0)
HCT: 43.9 % (ref 36.0–46.0)
Hemoglobin: 15.1 g/dL — ABNORMAL HIGH (ref 12.0–15.0)
Lymphocytes Relative: 38.5 % (ref 12.0–46.0)
Lymphs Abs: 2.2 10*3/uL (ref 0.7–4.0)
MCHC: 34.4 g/dL (ref 30.0–36.0)
MCV: 98 fl (ref 78.0–100.0)
Monocytes Absolute: 0.8 10*3/uL (ref 0.1–1.0)
Monocytes Relative: 13.5 % — ABNORMAL HIGH (ref 3.0–12.0)
Neutro Abs: 2.2 10*3/uL (ref 1.4–7.7)
Neutrophils Relative %: 37.9 % — ABNORMAL LOW (ref 43.0–77.0)
Platelets: 212 10*3/uL (ref 150.0–400.0)
RBC: 4.48 Mil/uL (ref 3.87–5.11)
RDW: 13.8 % (ref 11.5–15.5)
WBC: 5.7 10*3/uL (ref 4.0–10.5)

## 2018-07-22 LAB — COMPREHENSIVE METABOLIC PANEL
ALT: 20 U/L (ref 0–35)
AST: 19 U/L (ref 0–37)
Albumin: 4.3 g/dL (ref 3.5–5.2)
Alkaline Phosphatase: 80 U/L (ref 39–117)
BUN: 14 mg/dL (ref 6–23)
CO2: 31 mEq/L (ref 19–32)
Calcium: 9.3 mg/dL (ref 8.4–10.5)
Chloride: 99 mEq/L (ref 96–112)
Creatinine, Ser: 0.79 mg/dL (ref 0.40–1.20)
GFR: 71.22 mL/min (ref >60.00)
Glucose, Bld: 87 mg/dL (ref 70–99)
Potassium: 3.3 mEq/L — ABNORMAL LOW (ref 3.5–5.1)
Sodium: 141 mEq/L (ref 135–145)
Total Bilirubin: 0.7 mg/dL (ref 0.2–1.2)
Total Protein: 7.1 g/dL (ref 6.0–8.3)

## 2018-07-22 MED ORDER — AMOXICILLIN-POT CLAVULANATE 875-125 MG PO TABS: 1.0000 | ORAL_TABLET | Freq: Two times a day (BID) | ORAL | 0 refills | Status: DC

## 2018-07-22 NOTE — Assessment & Plan Note (Signed)
con't flonase augmentin per orders rto prn

## 2018-07-22 NOTE — Assessment & Plan Note (Addendum)
Running in the 40s at home----  Better in the office Pt ekg-- RBBB --- no change  Consider f/u with cardiology

## 2018-07-22 NOTE — Telephone Encounter (Signed)
Virtual Visit scheduled °

## 2018-07-22 NOTE — Assessment & Plan Note (Signed)
Check labs today.

## 2018-07-22 NOTE — Progress Notes (Signed)
Virtual Visit via Video Note  I connected with Alexandria [name] on 07/22/18 at  9:45 AM EDT by a video enabled telemedicine application and verified that I am speaking with the correct person using two identifiers.   I discussed the limitations of evaluation and management by telemedicine and the availability of in person appointments. The patient expressed understanding and agreed to proceed.  History of Present Illness:    Observations/Objective:   Assessment and Plan:   Follow Up Instructions:    I discussed the assessment and treatment plan with the patient. The patient was provided an opportunity to ask questions and all were answered. The patient agreed with the plan and demonstrated an understanding of the instructions.   The patient was advised to call back or seek an in-person evaluation if the symptoms worsen or if the condition fails to improve as anticipated.  I provided  minutes of non-face-to-face time during this encounter.   [name], DO   This encounter was created in error - please disregard. This encounter was created in error - please disregard.

## 2018-07-22 NOTE — Progress Notes (Signed)
Patient ID: Alexandria [name], female    DOB: 1945-11-14  Age: 73 y.o. MRN: [id number]    Subjective:  Subjective  HPI Alexandria [name] presents for extreme fatigue and low heart rate in the 40s   She also c/o sinus pressure and congestion   Review of Systems  Constitutional: Negative for chills and fever.  HENT: Positive for congestion, postnasal drip, rhinorrhea and sinus pressure. Negative for sore throat.   Respiratory: Negative for cough, chest tightness, shortness of breath and wheezing.   Cardiovascular: Negative for chest pain, palpitations and leg swelling.  Allergic/Immunologic: Negative for environmental allergies.    History Past Medical History:  Diagnosis Date  . Arthritis   . Asthmatic bronchitis with acute exacerbation 02/06/2015  . Bronchial pneumonia   . Colon polyp   . Diverticulitis   . DVT (deep venous thrombosis) ([place])    lower extremity  . H/O cardiac catheterization 2004   Normal coronary arteries  . Heart murmur   . History of stress test 05/21/2011  . Hx of echocardiogram 01/27/2010   Normal Ef 55% the transmitral spectral doppler flow pattern is normal for age. the left ventricular wall motion is normal  . Migraines   . Pancreatitis   . Thyroid disease   . TIA (transient ischemic attack)    8 years ago    She has a past surgical history that includes Vaginal hysterectomy (10/17/1998); Breast biopsy; Total knee arthroplasty; Neck surgery; Cardiac catheterization; left heart catheterization with coronary angiogram (N/A, 10/25/2013); Video bronchoscopy (Bilateral, 01/24/2018); and Cataract extraction (Right, 03/22/2018).   Her family history includes Arthritis in an other family member; Colon cancer in her brother and father; Diabetes in her maternal grandmother; HIV in her brother; Heart disease in her maternal grandmother; Hypertension in her maternal grandmother; Kidney cancer in her brother; Lung cancer in her brother; Other in her brother;  Ovarian cancer in her mother; Prostate cancer in her father; Stroke in her maternal grandmother; Uterine cancer in her mother.She reports that she quit smoking about 49 years ago. She quit after 2.00 years of use. She has never used smokeless tobacco. She reports that she does not drink alcohol or use drugs.  Current Outpatient Medications on File Prior to Visit  Medication Sig Dispense Refill  . benzonatate (TESSALON) 100 MG capsule TAKE 1 CAPSULE BY MOUTH THREE TIMES A DAY AS NEEDED FOR COUGH 90 capsule 1  . Biotin (RA BIOTIN) 1000 MCG tablet Take 1,000 mcg by mouth 2 (two) times daily.     . clopidogrel (PLAVIX) 75 MG tablet TAKE 1 TABLET BY MOUTH EVERY DAY 90 tablet 1  . dicyclomine (BENTYL) 20 MG tablet TAKE 1 TABLET BY MOUTH FOUR TIMES A DAY AS NEEDED 360 tablet 1  . furosemide (LASIX) 40 MG tablet Take 1 tablet (40 mg total) by mouth daily. (Patient taking differently: Take 40 mg by mouth 2 (two) times daily. ) 30 tablet 0  . KLOR-CON M20 20 MEQ tablet Take 20 mEq by mouth 2 (two) times daily.    . Multiple Vitamin (MULTIVITAMIN) tablet Take 1 tablet by mouth every other day. Gluten free/Vegetarian Multivitamin    . Nintedanib (OFEV) 150 MG CAPS Take 1 capsule (150 mg total) by mouth 2 (two) times daily. 60 capsule   . SYNTHROID 112 MCG tablet TAKE 1 TABLET BY MOUTH EVERY DAY BEFORE BREAKFAST 90 tablet 1  . vitamin C (ASCORBIC ACID) 500 MG tablet Take 1,000 mg by mouth 2 (two) times daily.     Marland Kitchen  vitamin E 1000 UNIT capsule Take 1,000 Units by mouth daily.     No current facility-administered medications on file prior to visit.      Objective:  Objective  Physical Exam Vitals signs and nursing note reviewed.  Constitutional:      Appearance: She is well-developed. She is not diaphoretic.  HENT:     Right Ear: External ear normal.     Left Ear: External ear normal.     Nose: Mucosal edema, congestion and rhinorrhea present. No nasal deformity.     Right Sinus: Maxillary sinus  tenderness and frontal sinus tenderness present.     Left Sinus: Maxillary sinus tenderness and frontal sinus tenderness present.     Mouth/Throat:     Pharynx: No oropharyngeal exudate.  Eyes:     General:        Right eye: No discharge.        Left eye: No discharge.     Conjunctiva/sclera: Conjunctivae normal.  Neck:     Musculoskeletal: Normal range of motion and neck supple.  Cardiovascular:     Rate and Rhythm: Normal rate and regular rhythm.     Heart sounds: Murmur present.  Pulmonary:     Effort: Pulmonary effort is normal. No respiratory distress.     Breath sounds: Normal breath sounds. No wheezing or rales.  Chest:     Chest wall: No tenderness.  Lymphadenopathy:     Cervical: No cervical adenopathy.  Skin:    General: Skin is warm.  Neurological:     Mental Status: She is alert and oriented to person, place, and time.    BP (!) 145/69 (BP Location: Right Arm, Cuff Size: Normal)   Pulse (!) 42   Temp 98.4 F (36.9 C) (Oral)   Resp 12   Ht 5' 4.4" (1.636 m)   Wt 138 lb (62.6 kg)   SpO2 97%   BMI 23.39 kg/m  Wt Readings from Last 3 Encounters:  07/22/18 138 lb (62.6 kg)  06/23/18 138 lb 3.2 oz (62.7 kg)  06/14/18 138 lb (62.6 kg)     Lab Results  Component Value Date   WBC 7.1 01/06/2018   HGB 14.0 01/06/2018   HCT 40.1 01/06/2018   PLT 238 01/06/2018   GLUCOSE 95 05/17/2018   CHOL 184 05/17/2018   TRIG 68.0 05/17/2018   HDL 55.30 05/17/2018   LDLCALC 115 (H) 05/17/2018   ALT 17 05/17/2018   AST 17 05/17/2018   NA 141 05/17/2018   K 3.6 05/17/2018   CL 103 05/17/2018   CREATININE 0.64 05/17/2018   BUN 12 05/17/2018   CO2 30 05/17/2018   TSH 3.49 05/17/2018   INR 1.1 (H) 05/07/2015    Dg Thoracic Spine 2 View  Result Date: 01/31/2018 CLINICAL DATA:  Back pain, injury EXAM: THORACIC SPINE 2 VIEWS COMPARISON:  CT chest 01/06/2018 FINDINGS: Kyphoscoliosis unchanged. Multilevel disc degeneration with disc space narrowing and spurring.  Negative for fracture. Atherosclerotic aortic arch. ACDF C6-7 with solid fusion and anterior plate IMPRESSION: Negative for thoracic fracture. Electronically Signed   By: Franchot Gallo M.D.   On: 01/31/2018 15:26   Dg Lumbar Spine 2-3 Views  Result Date: 01/31/2018 CLINICAL DATA:  Back injury.  Back pain EXAM: LUMBAR SPINE - 2-3 VIEW COMPARISON:  CT abdomen pelvis 08/25/2017 FINDINGS: Grade 1 anterolisthesis L3-4 L4-5 unchanged. Facet degeneration at L4-5 and L5-S1. Advanced disc degeneration T12-L1. Mild disc degeneration at L4-5 and L3-4. Generalized osteopenia Negative for fracture Atherosclerotic  aorta Mild levoscoliosis at L1. IMPRESSION: Negative for fracture. Electronically Signed   By: Franchot Gallo M.D.   On: 01/31/2018 15:25     Assessment & Plan:  Plan  I am having Claris Gower. Craigie start on amoxicillin-clavulanate. I am also having her maintain her multivitamin, Biotin, vitamin C, furosemide, vitamin E, Synthroid, clopidogrel, dicyclomine, benzonatate, Klor-Con M20, and Nintedanib.  Meds ordered this encounter  Medications  . amoxicillin-clavulanate (AUGMENTIN) 875-125 MG tablet    Sig: Take 1 tablet by mouth 2 (two) times daily.    Dispense:  20 tablet    Refill:  0    Problem List Items Addressed This Visit      Unprioritized   Bradycardia - Primary    Running in the 60s at home----  Better in the office Pt ekg-- RBBB --- no change  Consider f/u with cardiology      Relevant Orders   EKG 12-Lead (Completed)   Thyroid Panel With TSH   CBC with Differential/Platelet   Comprehensive metabolic panel   Fatigue    Check labs today      Relevant Orders   Thyroid Panel With TSH   CBC with Differential/Platelet   Comprehensive metabolic panel   Pansinusitis    con't flonase augmentin per orders rto prn       Relevant Medications   amoxicillin-clavulanate (AUGMENTIN) 875-125 MG tablet    Other Visit Diagnoses    Dizziness       Relevant Orders   EKG  12-Lead (Completed)   Thyroid Panel With TSH   CBC with Differential/Platelet   Comprehensive metabolic panel      Follow-up: Return in about 3 months (around 10/21/2018), or if symptoms worsen or fail to improve.  [name], DO

## 2018-07-22 NOTE — Patient Instructions (Signed)
How to Perform the Epley Maneuver  The Epley maneuver is an exercise that relieves symptoms of vertigo. Vertigo is the feeling that you or your surroundings are moving when they are not. When you feel vertigo, you may feel like the room is spinning and have trouble walking. Dizziness is a little different than vertigo. When you are dizzy, you may feel unsteady or light-headed.  You can do this maneuver at home whenever you have symptoms of vertigo. You can do it up to 3 times a day until your symptoms go away.  Even though the Epley maneuver may relieve your vertigo for a few weeks, it is possible that your symptoms will return. This maneuver relieves vertigo, but it does not relieve dizziness.  What are the risks?  If it is done correctly, the Epley maneuver is considered safe. Sometimes it can lead to dizziness or nausea that goes away after a short time. If you develop other symptoms, such as changes in vision, weakness, or numbness, stop doing the maneuver and call your health care provider.  How to perform the Epley maneuver  1. Sit on the edge of a bed or table with your back straight and your legs extended or hanging over the edge of the bed or table.  2. Turn your head halfway toward the affected ear or side.  3. Lie backward quickly with your head turned until you are lying flat on your back. You may want to position a pillow under your shoulders.  4. Hold this position for 30 seconds. You may experience an attack of vertigo. This is normal.  5. Turn your head to the opposite direction until your unaffected ear is facing the floor.  6. Hold this position for 30 seconds. You may experience an attack of vertigo. This is normal. Hold this position until the vertigo stops.  7. Turn your whole body to the same side as your head. Hold for another 30 seconds.  8. Sit back up.  You can repeat this exercise up to 3 times a day.  Follow these instructions at home:   After doing the Epley maneuver, you can return to  your normal activities.   Ask your health care provider if there is anything you should do at home to prevent vertigo. He or she may recommend that you:  ? Keep your head raised (elevated) with two or more pillows while you sleep.  ? Do not sleep on the side of your affected ear.  ? Get up slowly from bed.  ? Avoid sudden movements during the day.  ? Avoid extreme head movement, like looking up or bending over.  Contact a health care provider if:   Your vertigo gets worse.   You have other symptoms, including:  ? Nausea.  ? Vomiting.  ? Headache.  Get help right away if:   You have vision changes.   You have a severe or worsening headache or neck pain.   You cannot stop vomiting.   You have new numbness or weakness in any part of your body.  Summary   Vertigo is the feeling that you or your surroundings are moving when they are not.   The Epley maneuver is an exercise that relieves symptoms of vertigo.   If the Epley maneuver is done correctly, it is considered safe. You can do it up to 3 times a day.  This information is not intended to replace advice given to you by your health care provider. Make sure   you discuss any questions you have with your health care provider.  Document Released: 03/28/2013 Document Revised: 02/11/2016 Document Reviewed: 02/11/2016  Elsevier Interactive Patient Education  2019 [employer]  \

## 2018-07-23 LAB — THYROID PANEL WITH TSH
Free Thyroxine Index: 3.5 (ref 1.4–3.8)
T3 Uptake: 31 % (ref 22–35)
T4, Total: 11.3 ug/dL (ref 5.1–11.9)
TSH: 1.91 mIU/L (ref 0.40–4.50)

## 2018-07-29 ENCOUNTER — Telehealth: Payer: Self-pay | Admitting: Family Medicine

## 2018-07-29 NOTE — Telephone Encounter (Signed)
Returned patients call and reviewed results patietn was given earlier by CMA regarding Potassium.

## 2018-07-29 NOTE — Telephone Encounter (Signed)
Copied from [place] [id number]. Topic: Quick Communication - See Telephone Encounter >> Jul 29, 2018 12:53 PM Nils Flack wrote: CRM for notification. See Telephone encounter for: 07/29/[phone] Pt is calling asking what she should do regarding her potassium  Please call back

## 2018-08-01 NOTE — Telephone Encounter (Signed)
Patient notified

## 2018-08-01 NOTE — Telephone Encounter (Signed)
Pt called again to follow up on how she should be taking her potassium due to recent low levels. Please advise.

## 2018-08-03 DIAGNOSIS — J84112 Idiopathic pulmonary fibrosis: Secondary | ICD-10-CM | POA: Diagnosis not present

## 2018-08-10 DIAGNOSIS — J84112 Idiopathic pulmonary fibrosis: Secondary | ICD-10-CM | POA: Diagnosis not present

## 2018-08-17 ENCOUNTER — Telehealth: Payer: Self-pay | Admitting: Physician Assistant

## 2018-08-17 DIAGNOSIS — J84112 Idiopathic pulmonary fibrosis: Secondary | ICD-10-CM | POA: Diagnosis not present

## 2018-08-17 NOTE — Telephone Encounter (Signed)
New Message ° °Patient returning your call please call back. °

## 2018-08-18 ENCOUNTER — Telehealth: Payer: Self-pay

## 2018-08-18 ENCOUNTER — Telehealth: Payer: Self-pay | Admitting: *Deleted

## 2018-08-18 ENCOUNTER — Telehealth: Payer: Self-pay | Admitting: Physician Assistant

## 2018-08-18 ENCOUNTER — Telehealth (INDEPENDENT_AMBULATORY_CARE_PROVIDER_SITE_OTHER): Payer: Medicare Other | Admitting: Physician Assistant

## 2018-08-18 DIAGNOSIS — R001 Bradycardia, unspecified: Secondary | ICD-10-CM

## 2018-08-18 DIAGNOSIS — Z8673 Personal history of transient ischemic attack (TIA), and cerebral infarction without residual deficits: Secondary | ICD-10-CM

## 2018-08-18 DIAGNOSIS — J84112 Idiopathic pulmonary fibrosis: Secondary | ICD-10-CM

## 2018-08-18 DIAGNOSIS — I201 Angina pectoris with documented spasm: Secondary | ICD-10-CM

## 2018-08-18 DIAGNOSIS — E039 Hypothyroidism, unspecified: Secondary | ICD-10-CM

## 2018-08-18 NOTE — Telephone Encounter (Signed)
Follow up  Pt is returning a call   Please call back  

## 2018-08-18 NOTE — Telephone Encounter (Signed)
[employer] to mail 3 day ZIO XT long term holter monitor to her home.  Almyra Deforest, [place] ordered 24 hour holter.  Due to COVID-19 we are using ZIO patch monitors.  Minimum duration we can order is 3 days, however, you can remove monitor after 24 hours.  Instructions reviewed briefly as they are included in the monitor kit.

## 2018-08-18 NOTE — Progress Notes (Signed)
Virtual Visit via Video Note   This visit type was conducted due to national recommendations for restrictions regarding the COVID-19 Pandemic (e.g. social distancing) in an effort to limit this patient's exposure and mitigate transmission in our community.  Due to her co-morbid illnesses, this patient is at least at moderate risk for complications without adequate follow up.  This format is felt to be most appropriate for this patient at this time.  All issues noted in this document were discussed and addressed.  A limited physical exam was performed with this format.  Please refer to the patient's chart for her consent to telehealth for [facility].   Date:  08/20/2018   ID:  Alexandria [name], Alexandria [name] [birth year], MRN [id number]  Patient Location: Home Provider Location: Home  PCP:  [name], DO  Cardiologist:  Shelva Majestic, MD Electrophysiologist:  None   Evaluation Performed:  Follow-Up Visit  Chief Complaint:  followup  History of Present Illness:    Alexandria [name] is a 73 y.o. 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  She underwent initial cardiac catheterization in 2004 at which time she was to have normal coronary arteries but there was a apical defect which could be caused by coronary vasospasm.  She was placed on medical therapy.  She is on aspirin and Plavix for history of TIA.  Myoview in 2015 was intermediate risk and the suggested possible septal and anterolateral ischemia.  Repeat cardiac catheterization performed on 10/26/2013 showed normal coronaries again, resolution of her previous apical wall motion abnormality, EF was 60 to 65%.  She had some issue with chronic lower extremity edema secondary to venous insufficiency.  Last echocardiogram obtained on 06/15/2017 showed EF 55 to 60%, grade 1 DD, mild mitral regurgitation, mild AI.  Patient was diagnosed with idiopathic pulmonary  fibrosis in 2019 however despite this diagnosis, she remains quite active walking several miles on a daily basis and continue to sing at local gathering.  She was recently seen by her PCP, initially was noted her heart rate was in the 40s.  EKG demonstrated sinus rhythm, right bundle branch block which is chronic, and quadrigeminy.  Heart rate based on EKG was in the 70s.  Patient complaining of some fatigue.  She was prescribed a course of Augmentin.  TSH was normal.  Patient was contacted today via doximity.  She says her fatigue has been going on for a long time and is nothing new.  She denies any recent exertional chest pain or shortness of breath despite her active lifestyle.  She is on Ofev for etiopathic pulmonary fibrosis, online prescribing information has been checked for this medication, it does have a fairly low chance of causing coronary artery disease however since patient does not have any obvious symptom, I would not proceed with any ischemic work-up at this time.  This is not a medication that will increase QT interval based on manufacturer's prescribing information.  I suspect that some of her recent diagnosis of bradycardia is due to the machine unable to pick up PVCs.  I recommended 24-hour Holter monitor to check her overall heart rate.  Otherwise, I think she is doing well from cardiology perspective.  The patient does not have symptoms concerning for COVID-19 infection (fever, chills, cough, or new shortness of breath).    Past Medical History:  Diagnosis Date   Arthritis    Asthmatic bronchitis with acute exacerbation 02/06/2015  Bronchial pneumonia    Colon polyp    Diverticulitis    DVT (deep venous thrombosis) (HCC)    lower extremity   H/O cardiac catheterization 2004   Normal coronary arteries   Heart murmur    History of stress test 05/21/2011   Hx of echocardiogram 01/27/2010   Normal Ef 55% the transmitral spectral doppler flow pattern is normal for age.  the left ventricular wall motion is normal   Migraines    Pancreatitis    Thyroid disease    TIA (transient ischemic attack)    8 years ago   Past Surgical History:  Procedure Laterality Date   BREAST BIOPSY     [place]   CARDIAC CATHETERIZATION     10/2013   CATARACT EXTRACTION Right 03/22/2018   LEFT HEART CATHETERIZATION WITH CORONARY ANGIOGRAM N/A 10/25/2013   Procedure: LEFT HEART CATHETERIZATION WITH CORONARY ANGIOGRAM;  Surgeon: Troy Sine, MD;  Location: [facility] CATH LAB;  Service: Cardiovascular;  Laterality: N/A;   NECK SURGERY     x's 2   TOTAL KNEE ARTHROPLASTY     Bilateral x's 2   VAGINAL HYSTERECTOMY  10/17/1998   [name]   VIDEO BRONCHOSCOPY Bilateral 01/24/2018   Procedure: VIDEO BRONCHOSCOPY WITH FLUORO;  Surgeon: [name], MD;  Location: [place];  Service: Cardiopulmonary;  Laterality: Bilateral;     Current Meds  Medication Sig   benzonatate (TESSALON) 100 MG capsule TAKE 1 CAPSULE BY MOUTH THREE TIMES A DAY AS NEEDED FOR COUGH   Biotin (RA BIOTIN) 1000 MCG tablet Take 1,000 mcg by mouth 2 (two) times daily.    clopidogrel (PLAVIX) 75 MG tablet TAKE 1 TABLET BY MOUTH EVERY DAY   dicyclomine (BENTYL) 20 MG tablet TAKE 1 TABLET BY MOUTH FOUR TIMES A DAY AS NEEDED   furosemide (LASIX) 40 MG tablet Take 1 tablet (40 mg total) by mouth daily. (Patient taking differently: Take 40 mg by mouth 2 (two) times daily. )   KLOR-CON M20 20 MEQ tablet Take 20 mEq by mouth 2 (two) times daily.   Multiple Vitamin (MULTIVITAMIN) tablet Take 1 tablet by mouth every other day. Gluten free/Vegetarian Multivitamin   Nintedanib (OFEV) 150 MG CAPS Take 1 capsule (150 mg total) by mouth 2 (two) times daily.   SYNTHROID 112 MCG tablet TAKE 1 TABLET BY MOUTH EVERY DAY BEFORE BREAKFAST   vitamin C (ASCORBIC ACID) 500 MG tablet Take 1,000 mg by mouth 2 (two) times daily.    vitamin E 1000 UNIT capsule Take 1,000 Units by mouth daily.     Allergies:    Prednisone; Atrovent hfa [ipratropium bromide hfa]; Cheratussin ac [guaifenesin-codeine]; Codeine; Hydrocodone; Influenza vaccines; and Motrin [ibuprofen]   Social History   Tobacco Use   Smoking status: Former Smoker    Years: 2.00    Last attempt to quit: 04/06/1969    Years since quitting: 49.4   Smokeless tobacco: Never Used   Tobacco comment: socially smoked x 2 years  Substance Use Topics   Alcohol use: No    Alcohol/week: 0.0 standard drinks   Drug use: No     Family Hx: The patient's family history includes Arthritis in an other family member; Colon cancer in her brother and father; Diabetes in her maternal grandmother; HIV in her brother; Heart disease in her maternal grandmother; Hypertension in her maternal grandmother; Kidney cancer in her brother; Lung cancer in her brother; Other in her brother; Ovarian cancer in her mother; Prostate cancer in her father;  Stroke in her maternal grandmother; Uterine cancer in her mother.  ROS:   Please see the history of present illness.     All other systems reviewed and are negative.   Prior CV studies:   The following studies were reviewed today:  Echo 06/15/2017 LV EF: 55% -   60% Study Conclusions  - Left ventricle: The cavity size was normal. Wall thickness was   normal. Systolic function was normal. The estimated ejection   fraction was in the range of 55% to 60%. Wall motion was normal;   there were no regional wall motion abnormalities. Doppler   parameters are consistent with abnormal left ventricular   relaxation (grade 1 diastolic dysfunction). Doppler parameters   are consistent with high ventricular filling pressure. - Aortic valve: There was mild regurgitation. - Mitral valve: Calcified annulus. There was mild regurgitation.  Impressions:  - Normal LV systolic function; mild diastolic dysfunction; elevated   LV filling pressure; mild AI; mild MR.  Labs/Other Tests and Data Reviewed:    EKG:  An ECG  dated 07/22/2018 was personally reviewed today and demonstrated:  NSR with PVCs  Recent Labs: 01/07/2018: B Natriuretic Peptide 270.1; Magnesium 2.9 07/22/2018: ALT 20; BUN 14; Creatinine, Ser 0.79; Hemoglobin 15.1; Platelets 212.0; Potassium 3.3; Sodium 141; TSH 1.91   Recent Lipid Panel Lab Results  Component Value Date/Time   CHOL 184 05/17/2018 08:03 AM   TRIG 68.0 05/17/2018 08:03 AM   HDL 55.30 05/17/2018 08:03 AM   CHOLHDL 3 05/17/2018 08:03 AM   LDLCALC 115 (H) 05/17/2018 08:03 AM    Wt Readings from Last 3 Encounters:  07/22/18 138 lb (62.6 kg)  06/23/18 138 lb 3.2 oz (62.7 kg)  06/14/18 138 lb (62.6 kg)     Objective:    Vital Signs:  There were no vitals taken for this visit.   VITAL SIGNS:  reviewed  ASSESSMENT & PLAN:    1. Bradycardia: Now though patient was sent to the cardiology office for evaluation bradycardia, however I suspect she does not have true bradycardia instead her machine was unable to pick up the PVCs.  Recent EKG obtained by PCP showed normal sinus rhythm with PVCs, heart rate was within normal limit.  She does have fatigue, however this is chronic for her and has not had any recent changes.  I recommend a 24-hour Holter monitor for further evaluation.  If her PVC burden is lower than 10%, given her asymptomatic nature, I probably would not recommend a rate control agent at this time.  2. Hypothyroidism: On Synthroid, followed by primary care provider  3. History of coronary spasm: Denies any recurrent chest discomfort recently.  Previous cardiac catheterization in 2015 showed clean coronaries  4. History of TIA: No recurrence.  On Plavix.    COVID-19 Education: The signs and symptoms of COVID-19 were discussed with the patient and how to seek care for testing (follow up with PCP or arrange E-visit).  The importance of social distancing was discussed today.  Time:   Today, I have spent 18 minutes with the patient with telehealth technology  discussing the above problems.     Medication Adjustments/Labs and Tests Ordered: Current medicines are reviewed at length with the patient today.  Concerns regarding medicines are outlined above.   Tests Ordered: Orders Placed This Encounter  Procedures   HOLTER MONITOR - 24 HOUR    Medication Changes: No orders of the defined types were placed in this encounter.   Disposition:  Follow up  in 3 week(s)  Signed, Almyra Deforest, [place]  08/20/2018 10:53 PM    [facility]

## 2018-08-18 NOTE — Patient Instructions (Addendum)
Medication Instructions:   Your physician recommends that you continue on your current medications as directed. Please refer to the Current Medication list given to you today.  If you need a refill on your cardiac medications before your next appointment, please call your pharmacy.   Lab work:  NONE ordered at this time of appointment   If you have labs (blood work) drawn today and your tests are completely normal, you will receive your results only by: Marland Kitchen MyChart Message (if you have MyChart) OR . A paper copy in the mail If you have any lab test that is abnormal or we need to change your treatment, we will call you to review the results.  Testing/Procedures:  24 hour Holter monitor   Your physician has recommended that you wear a holter monitor. Holter monitors are medical devices that record the heart's electrical activity. Doctors most often use these monitors to diagnose arrhythmias. Arrhythmias are problems with the speed or rhythm of the heartbeat. The monitor is a small, portable device. You can wear one while you do your normal daily activities. This is usually used to diagnose what is causing palpitations/syncope (passing out).    Follow-Up: At [facility], you and your health needs are our priority.  As part of our continuing mission to provide you with exceptional heart care, we have created designated Provider Care Teams.  These Care Teams include your primary Cardiologist (physician) and Advanced Practice Providers (APPs -  Physician Assistants and Nurse Practitioners) who all work together to provide you with the care you need, when you need it. You will need a follow up appointment in 3 weeks.  Please call our office 2 months in advance to schedule this appointment.  You may see Shelva Majestic, MD or one of the following Advanced Practice Providers on your designated Care Team: [place], [place] . Fabian Sharp, PA-C  Any Other Special Instructions Will Be Listed Below (If  Applicable).

## 2018-08-18 NOTE — Telephone Encounter (Signed)
Follow up   Patient is returning a call. Please call.

## 2018-08-18 NOTE — Telephone Encounter (Signed)
Virtual Visit Pre-Appointment Phone Call  "(Name), I am calling you today to discuss your upcoming appointment. We are currently trying to limit exposure to the virus that causes COVID-19 by seeing patients at home rather than in the office."  1. "What is the BEST phone number to call the day of the visit?" - include this in appointment notes  2. "Do you have or have access to (through a family member/friend) a smartphone with video capability that we can use for your visit?" a. If yes - list this number in appt notes as "cell" (if different from BEST phone #) and list the appointment type as a VIDEO visit in appointment notes b. If no - list the appointment type as a PHONE visit in appointment notes  3. Confirm consent - "In the setting of the current Covid19 crisis, you are scheduled for a VIDEO visit with your provider on 08/18/2018 at 8:30AM.  Just as we do with many in-office visits, in order for you to participate in this visit, we must obtain consent.  If you'd like, I can send this to your mychart (if signed up) or email for you to review.  Otherwise, I can obtain your verbal consent now.  All virtual visits are billed to your insurance company just like a normal visit would be.  By agreeing to a virtual visit, we'd like you to understand that the technology does not allow for your provider to perform an examination, and thus may limit your provider's ability to fully assess your condition. If your provider identifies any concerns that need to be evaluated in person, we will make arrangements to do so.  Finally, though the technology is pretty good, we cannot assure that it will always work on either your or our end, and in the setting of a video visit, we may have to convert it to a phone-only visit.  In either situation, we cannot ensure that we have a secure connection.  Are you willing to proceed?" STAFF: Did the patient verbally acknowledge consent to telehealth visit? Document YES/NO  here: YES  4. Advise patient to be prepared - "Two hours prior to your appointment, go ahead and check your blood pressure, pulse, oxygen saturation, and your weight (if you have the equipment to check those) and write them all down. When your visit starts, your provider will ask you for this information. If you have an Apple Watch or Kardia device, please plan to have heart rate information ready on the day of your appointment. Please have a pen and paper handy nearby the day of the visit as well."  5. Give patient instructions for MyChart download to smartphone OR Doximity/[email or web address] as below if video visit (depending on what platform provider is using)  6. Inform patient they will receive a phone call 15 minutes prior to their appointment time (may be from unknown caller ID) so they should be prepared to answer    TELEPHONE CALL NOTE  Alexandria [name] has been deemed a candidate for a follow-up tele-health visit to limit community exposure during the Covid-19 pandemic. I spoke with the patient via phone to ensure availability of phone/video source, confirm preferred email & phone number, and discuss instructions and expectations.  I reminded Alexandria [name] to be prepared with any vital sign and/or heart rhythm information that could potentially be obtained via home monitoring, at the time of her visit. I reminded Alexandria [name] to expect a phone call prior to her visit.  [name], [place] 08/18/2018 8:26 AM   INSTRUCTIONS FOR DOWNLOADING THE MYCHART APP TO SMARTPHONE  - The patient must first make sure to have activated MyChart and know their login information - If Apple, go to [employer] and type in MyChart in the search bar and download the app. If Android, ask patient to go to [employer] and type in [place] in the search bar and download the app. The app is free but as with any other app downloads, their phone may require them to verify saved payment information or  Apple/Android password.  - The patient will need to then log into the app with their MyChart username and password, and select [facility] as their healthcare provider to link the account. When it is time for your visit, go to the MyChart app, find appointments, and click Begin Video Visit. Be sure to Select Allow for your device to access the Microphone and Camera for your visit. You will then be connected, and your provider will be with you shortly.  **If they have any issues connecting, or need assistance please contact MyChart service desk [phone])**  **If using a computer, in order to ensure the best quality for their visit they will need to use either of the following Internet Browsers: [employer], or Google Chrome**  IF USING DOXIMITY or DOXY.ME - The patient will receive a link just prior to their visit by text.     FULL LENGTH CONSENT FOR TELE-HEALTH VISIT   I hereby voluntarily request, consent and authorize [facility] and its employed or contracted physicians, physician assistants, nurse practitioners or other licensed health care professionals (the Practitioner), to provide me with telemedicine health care services (the "Services") as deemed necessary by the treating Practitioner. I acknowledge and consent to receive the Services by the Practitioner via telemedicine. I understand that the telemedicine visit will involve communicating with the Practitioner through live audiovisual communication technology and the disclosure of certain medical information by electronic transmission. I acknowledge that I have been given the opportunity to request an in-person assessment or other available alternative prior to the telemedicine visit and am voluntarily participating in the telemedicine visit.  I understand that I have the right to withhold or withdraw my consent to the use of telemedicine in the course of my care at any time, without affecting my right to future care  or treatment, and that the Practitioner or I may terminate the telemedicine visit at any time. I understand that I have the right to inspect all information obtained and/or recorded in the course of the telemedicine visit and may receive copies of available information for a reasonable fee.  I understand that some of the potential risks of receiving the Services via telemedicine include:  Marland Kitchen Delay or interruption in medical evaluation due to technological equipment failure or disruption; . Information transmitted may not be sufficient (e.g. poor resolution of images) to allow for appropriate medical decision making by the Practitioner; and/or  . In rare instances, security protocols could fail, causing a breach of personal health information.  Furthermore, I acknowledge that it is my responsibility to provide information about my medical history, conditions and care that is complete and accurate to the best of my ability. I acknowledge that Practitioner's advice, recommendations, and/or decision may be based on factors not within their control, such as incomplete or inaccurate data provided by me or distortions of diagnostic images or specimens that may result from electronic transmissions. I understand that the  practice of medicine is not an Chief Strategy Officer and that Practitioner makes no warranties or guarantees regarding treatment outcomes. I acknowledge that I will receive a copy of this consent concurrently upon execution via email to the email address I last provided but may also request a printed copy by calling the office of [place].    I understand that my insurance will be billed for this visit.   I have read or had this consent read to me. . I understand the contents of this consent, which adequately explains the benefits and risks of the Services being provided via telemedicine.  . I have been provided ample opportunity to ask questions regarding this consent and the Services and have had  my questions answered to my satisfaction. . I give my informed consent for the services to be provided through the use of telemedicine in my medical care  By participating in this telemedicine visit I agree to the above.

## 2018-08-18 NOTE — Telephone Encounter (Signed)
Patient states that she got a call after her visit with Almyra Deforest this morning. She is returning that call

## 2018-08-22 ENCOUNTER — Other Ambulatory Visit: Payer: Self-pay | Admitting: Family Medicine

## 2018-08-24 DIAGNOSIS — J84112 Idiopathic pulmonary fibrosis: Secondary | ICD-10-CM | POA: Diagnosis not present

## 2018-08-25 ENCOUNTER — Ambulatory Visit (INDEPENDENT_AMBULATORY_CARE_PROVIDER_SITE_OTHER): Payer: Medicare Other

## 2018-08-25 DIAGNOSIS — R001 Bradycardia, unspecified: Secondary | ICD-10-CM | POA: Diagnosis not present

## 2018-08-26 DIAGNOSIS — H43813 Vitreous degeneration, bilateral: Secondary | ICD-10-CM | POA: Diagnosis not present

## 2018-08-26 DIAGNOSIS — H16223 Keratoconjunctivitis sicca, not specified as Sjogren's, bilateral: Secondary | ICD-10-CM | POA: Diagnosis not present

## 2018-08-30 DIAGNOSIS — J84112 Idiopathic pulmonary fibrosis: Secondary | ICD-10-CM | POA: Diagnosis not present

## 2018-08-31 ENCOUNTER — Other Ambulatory Visit: Payer: Self-pay | Admitting: Pulmonary Disease

## 2018-08-31 NOTE — Telephone Encounter (Signed)
Is this appropriate for refill ?  Last fill date 07/21/2018

## 2018-09-05 ENCOUNTER — Telehealth: Payer: Self-pay | Admitting: Cardiovascular Disease

## 2018-09-05 ENCOUNTER — Other Ambulatory Visit: Payer: Self-pay | Admitting: Family Medicine

## 2018-09-05 DIAGNOSIS — I495 Sick sinus syndrome: Secondary | ICD-10-CM | POA: Diagnosis not present

## 2018-09-05 NOTE — Telephone Encounter (Signed)
Mychart pending, smartphone, consent, pre reg complete 09/05/18 AF

## 2018-09-06 ENCOUNTER — Encounter: Payer: Self-pay | Admitting: Family Medicine

## 2018-09-06 ENCOUNTER — Ambulatory Visit (INDEPENDENT_AMBULATORY_CARE_PROVIDER_SITE_OTHER): Payer: Medicare Other | Admitting: Family Medicine

## 2018-09-06 ENCOUNTER — Other Ambulatory Visit: Payer: Self-pay

## 2018-09-06 DIAGNOSIS — J0140 Acute pansinusitis, unspecified: Secondary | ICD-10-CM

## 2018-09-06 DIAGNOSIS — I201 Angina pectoris with documented spasm: Secondary | ICD-10-CM | POA: Diagnosis not present

## 2018-09-06 MED ORDER — AMOXICILLIN-POT CLAVULANATE 875-125 MG PO TABS: 1.0000 | ORAL_TABLET | Freq: Two times a day (BID) | ORAL | 0 refills | Status: DC

## 2018-09-06 NOTE — Progress Notes (Signed)
Virtual Visit via Video Note  I connected with Alexandria [name] on 09/06/18 at  9:30 AM EDT by a video enabled telemedicine application and verified that I am speaking with the correct person using two identifiers.  Location: Patient: home Provider: office    I discussed the limitations of evaluation and management by telemedicine and the availability of in person appointments. The patient expressed understanding and agreed to proceed.  History of Present Illness: Pt home c/o sinus congestion and headache.  +chills  No fever   Symptoms x few weeks    Observations/Objective: No vitals obtained Afebrile   Assessment and Plan: .1. Acute pansinusitis, recurrence not specified flonase and saline  abx sent to pharmacy  - amoxicillin-clavulanate (AUGMENTIN) 875-125 MG tablet; Take 1 tablet by mouth 2 (two) times daily.  Dispense: 20 tablet; Refill: 0   Follow Up Instructions:   I discussed the assessment and treatment plan with the patient. The patient was provided an opportunity to ask questions and all were answered. The patient agreed with the plan and demonstrated an understanding of the instructions.   The patient was advised to call back or seek an in-person evaluation if the symptoms worsen or if the condition fails to improve as anticipated.  I provided 15 minutes of non-face-to-face time during this encounter.   [name], DO

## 2018-09-07 ENCOUNTER — Encounter: Payer: Self-pay | Admitting: Cardiovascular Disease

## 2018-09-07 ENCOUNTER — Telehealth (INDEPENDENT_AMBULATORY_CARE_PROVIDER_SITE_OTHER): Payer: Medicare Other | Admitting: Cardiovascular Disease

## 2018-09-07 VITALS — Ht 63.0 in | Wt 134.0 lb

## 2018-09-07 DIAGNOSIS — I471 Supraventricular tachycardia: Secondary | ICD-10-CM | POA: Diagnosis not present

## 2018-09-07 DIAGNOSIS — I201 Angina pectoris with documented spasm: Secondary | ICD-10-CM

## 2018-09-07 DIAGNOSIS — J84112 Idiopathic pulmonary fibrosis: Secondary | ICD-10-CM | POA: Diagnosis not present

## 2018-09-07 DIAGNOSIS — E039 Hypothyroidism, unspecified: Secondary | ICD-10-CM

## 2018-09-07 DIAGNOSIS — R002 Palpitations: Secondary | ICD-10-CM | POA: Diagnosis not present

## 2018-09-07 DIAGNOSIS — I4510 Unspecified right bundle-branch block: Secondary | ICD-10-CM

## 2018-09-07 MED ORDER — METOPROLOL SUCCINATE ER 25 MG PO TB24: 25.0000 mg | ORAL_TABLET | Freq: Every day | ORAL | 1 refills | Status: DC

## 2018-09-07 NOTE — Patient Instructions (Addendum)
Medication Instructions:  Start Metoprolol XL 25 daily.  If you need a refill on your cardiac medications before your next appointment, please call your pharmacy.   Follow-Up: At [facility], you and your health needs are our priority.  As part of our continuing mission to provide you with exceptional heart care, we have created designated Provider Care Teams.  These Care Teams include your primary Cardiologist (physician) and Advanced Practice Providers (APPs -  Physician Assistants and Nurse Practitioners) who all work together to provide you with the care you need, when you need it. You will need a follow up appointment in 3 months.  Please call our office 2 months in advance to schedule this appointment.  You may see Shelva Majestic, MD or one of the following Advanced Practice Providers on your designated Care Team: [place], [place] . Fabian Sharp, PA-C

## 2018-09-07 NOTE — Progress Notes (Signed)
Virtual Visit via Video Note   This visit type was conducted due to national recommendations for restrictions regarding the COVID-19 Pandemic (e.g. social distancing) in an effort to limit this patient's exposure and mitigate transmission in our community.  Due to her co-morbid illnesses, this patient is at least at moderate risk for complications without adequate follow up.  This format is felt to be most appropriate for this patient at this time.  All issues noted in this document were discussed and addressed.  A limited physical exam was performed with this format.  Please refer to the patient's chart for her consent to telehealth for [facility].   Date:  09/07/2018   ID:  Alexandria [name], Alexandria [name] [birth year], MRN [id number]  Patient Location: Home Provider Location: Home  PCP:  [name], DO  Cardiologist:  Shelva Majestic, MD  Electrophysiologist:  None   Evaluation Performed:  Follow-Up Visit  Chief Complaint: Last office visit with me in June 11, 2015; follow-up of recent palpitations  History of Present Illness:    CLYDETTE PRIVITERA is a 73 y.o. female who had recently seen Almyra Deforest, PA-C.  She presents in follow-up of her Holter monitor study.  Mr. Aron Baba underwent initial cardiac catheterization in 2004.  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  She had done well on medical therapy.  She has a remote history of TIA for which she has been on aspirin and Plavix.  In 2015, she was complaining of increasing shortness of breath.  A nuclear studies was intermediate risk and suggested possible septal and anterolateral ischemia.  Repeat cardiac catheterization was done on 10/25/2013 which again showed normal coronary arteries.  Her LV function was now normal with resolution of her previous apical wall motion  abnormality.  Ejection fraction was 60-65%.  She had been doing fairly well.  She was seen by Kerin Ransom with atypical chest pain.  She also admitted to some chronic edema secondary to venous insufficiency.  She complained of nonexertional shortness of breath.  She underwent an echo Doppler study 01/27/2015 which showed an EF of 55-60% with normal wall motion.  There was grade 1 diastolic dysfunction.  Was mild MR and mild AR.    She also has a history of hypothyroidism.  She tries to walk every day.  There is a history of diverticular disease.  I have not seen her since March 2017. She has had  issue with chronic lower extremity edema secondary to venous insufficiency.  An echocardiogram obtained on 06/15/2017 showed EF 55 to 60%, grade 1 DD, mild mitral regurgitation, mild AI.  She was diagnosed with idiopathic pulmonary fibrosis in 2019 however despite this diagnosis, she remains quite active walking several miles on a daily basis and continue to sing at local gathering.  She was seen by her PCP, and it was noted that her heart rate was in the 40s.  EKG demonstrated sinus rhythm, right bundle branch block which is chronic, and quadrigeminy.  Heart rate based on EKG was in the 70s.  Patient complaining of some fatigue.  She was prescribed a course of Augmentin.  TSH was normal.  She underwent a telemedicine visit with Almyra Deforest on 08/18/2018.  She says her fatigue has been going on for a long time and is nothing new.  She denies any recent  exertional chest pain or shortness of breath despite her active lifestyle.  She is on Ofev for iopathic pulmonary fibrosis, online prescribing information has been checked for this medication, it does have a fairly low chance of causing coronary artery disease however since patient does not have any obvious symptom, I would not proceed with any ischemic work-up at this time.  This is not a medication that will increase QT interval based on manufacturer's prescribing  information.  I suspect that some of her recent diagnosis of bradycardia is due to the machine unable to pick up PVCs.  A 24-hour Holter monitor was recommended.   On the Holter monitor study she was monitored for 22 hours and the predominant rhythm was sinus rhythm with an average heart rate at 68 bpm.  The slowest heart rate was sinus bradycardia at 46 bpm.  There were 11 episodes of SVT with the fastest interval lasting 12.2 seconds with a maximum rate at 235 bpm (average 204 bpm.)  There were rare PACs.  There were occasional isolated PVCs representing 2.7%.  There were no episodes of ventricular couplets or triplets.   Presently, she denies chest pain PND orthopnea.  She still admits to occasional episodes of short-lived palpitations.  She continues to be on levothyroxine 112 mcg for hypothyroidism.  She has been taking furosemide in addition to supplemental potassium and recently was on antibiotic therapy.   The patient does not have symptoms concerning for COVID-19 infection (fever, chills, cough, or new shortness of breath).    Past Medical History:  Diagnosis Date   Arthritis    Asthmatic bronchitis with acute exacerbation 02/06/2015   Bronchial pneumonia    Colon polyp    Diverticulitis    DVT (deep venous thrombosis) (HCC)    lower extremity   H/O cardiac catheterization 2004   Normal coronary arteries   Heart murmur    History of stress test 05/21/2011   Hx of echocardiogram 01/27/2010   Normal Ef 55% the transmitral spectral doppler flow pattern is normal for age. the left ventricular wall motion is normal   Migraines    Pancreatitis    Thyroid disease    TIA (transient ischemic attack)    8 years ago   Past Surgical History:  Procedure Laterality Date   BREAST BIOPSY     [facility]   CARDIAC CATHETERIZATION     10/2013   CATARACT EXTRACTION Right 03/22/2018   LEFT HEART CATHETERIZATION WITH CORONARY ANGIOGRAM N/A 10/25/2013   Procedure: LEFT HEART  CATHETERIZATION WITH CORONARY ANGIOGRAM;  Surgeon: Troy Sine, MD;  Location: [facility] C[facility] LAB;  Service: Cardiovascular;  Laterality: N/A;   NECK SURGERY     x's 2   TOTAL KNEE ARTHROPLASTY     Bilateral x's 2   VAGINAL HYSTERECTOMY  10/17/1998   [name]   VIDEO BRONCHOSCOPY Bilateral 01/24/2018   Procedure: VIDEO BRONCHOSCOPY WITH FLUORO;  Surgeon: [name], MD;  Location: [facility];  Service: Cardiopulmonary;  Laterality: Bilateral;     Current Meds  Medication Sig   amoxicillin-clavulanate (AUGMENTIN) 875-125 MG tablet Take 1 tablet by mouth 2 (two) times daily.   benzonatate (TESSALON) 100 MG capsule TAKE 1 CAPSULE BY MOUTH 3 TIMES A DAY AS NEEDED FOR COUGH   Biotin (RA BIOTIN) 1000 MCG tablet Take 1,000 mcg by mouth 2 (two) times daily.    clopidogrel (PLAVIX) 75 MG tablet TAKE 1 TABLET BY MOUTH EVERY DAY   dicyclomine (BENTYL) 20 MG tablet TAKE 1 TABLET  BY MOUTH FOUR TIMES A DAY AS NEEDED   furosemide (LASIX) 40 MG tablet Take 1 tablet (40 mg total) by mouth daily. (Patient taking differently: Take 40 mg by mouth 2 (two) times daily. )   KLOR-CON M20 20 MEQ tablet Take 20 mEq by mouth 2 (two) times daily.   Multiple Vitamin (MULTIVITAMIN) tablet Take 1 tablet by mouth every other day. Gluten free/Vegetarian Multivitamin   Nintedanib (OFEV) 150 MG CAPS Take 1 capsule (150 mg total) by mouth 2 (two) times daily.   SYNTHROID 112 MCG tablet TAKE 1 TABLET BY MOUTH EVERY DAY BEFORE BREAKFAST   vitamin C (ASCORBIC ACID) 500 MG tablet Take 1,000 mg by mouth 2 (two) times daily.    vitamin E 1000 UNIT capsule Take 1,000 Units by mouth daily.     Allergies:   Prednisone; Atrovent hfa [ipratropium bromide hfa]; Cheratussin ac [guaifenesin-codeine]; Codeine; Hydrocodone; Influenza vaccines; and Motrin [ibuprofen]   Social History   Tobacco Use   Smoking status: Former Smoker    Years: 2.00    Last attempt to quit: 04/06/1969    Years since quitting: 49.4    Smokeless tobacco: Never Used   Tobacco comment: socially smoked x 2 years  Substance Use Topics   Alcohol use: No    Alcohol/week: 0.0 standard drinks   Drug use: No     Family Hx: The patient's family history includes Arthritis in an other family member; Colon cancer in her brother and father; Diabetes in her maternal grandmother; HIV in her brother; Heart disease in her maternal grandmother; Hypertension in her maternal grandmother; Kidney cancer in her brother; Lung cancer in her brother; Other in her brother; Ovarian cancer in her mother; Prostate cancer in her father; Stroke in her maternal grandmother; Uterine cancer in her mother.  ROS:   Please see the history of present illness.    No fevers chills night sweats No change in smell or cough Episodic palpitations No wheezing Idiopathic pulmonary fibrosis Recent sinus congestion felt to be due to acute pansinusitis given a prescription for Augmentin No significant edema No tremor Sleeping adequately  All other systems reviewed and are negative.   Prior CV studies:   The following studies were reviewed today:  Patient had a min HR of 46 bpm, max HR of 235 bpm, and avg HR of 68 bpm. Predominant underlying rhythm was Sinus Rhythm. Intermittent Bundle Branch Block was present. 11 Supraventricular Tachycardia runs occurred, the run with the fastest interval lasting 12.2 secs with a max rate of 235 bpm (avg 204 bpm); the run with the fastest interval was also the longest. True duration of Supraventricular Tachycardia difficult to ascertain due to artifact. Isolated SVEs were rare (<1.0%), SVE Couplets were rare (<1.0%), and SVE Triplets were rare (<1.0%). Isolated VEs were occasional (2.7%, 2385), and no VE Couplets or VE Triplets were present. Ventricular Bigeminy and Trigeminy were present. Inverted QRS complexes possibly due to inverted placement of device.  Labs/Other Tests and Data Reviewed:    EKG:  An ECG dated  07/22/2018 was personally reviewed today and demonstrated:  Sinus rhythm at 76, occasional PVCs, right bundle branch block with repolarization changes  Recent Labs: 01/07/2018: B Natriuretic Peptide 270.1; Magnesium 2.9 07/22/2018: ALT 20; BUN 14; Creatinine, Ser 0.79; Hemoglobin 15.1; Platelets 212.0; Potassium 3.3; Sodium 141; TSH 1.91   Recent Lipid Panel Lab Results  Component Value Date/Time   CHOL 184 05/17/2018 08:03 AM   TRIG 68.0 05/17/2018 08:03 AM   HDL 55.30  05/17/2018 08:03 AM   CHOLHDL 3 05/17/2018 08:03 AM   LDLCALC 115 (H) 05/17/2018 08:03 AM    Wt Readings from Last 3 Encounters:  09/07/18 134 lb (60.8 kg)  07/22/18 138 lb (62.6 kg)  06/23/18 138 lb 3.2 oz (62.7 kg)     Objective:    Vital Signs:  Ht 5\' 3"  (1.6 m)    Wt 134 lb (60.8 kg)    BMI 23.74 kg/m    She appears well-developed and well-nourished in no acute distress. Breathing was normal and not labored HEENT was grossly unremarkable There is no neck vein distention There was no audible wheezing She denied any chest wall discomfort to her palpation Her pulse was regular in the 70s She denied abdominal discomfort There was no significant leg edema She denied new neurologic symptoms Normal cognition mood and affect   ASSESSMENT & PLAN:    1. Palpitations: The patient recent Holter monitor study was reviewed with her in detail.  The present study showed predominant sinus rhythm with an average rate at 68 with a slowest heart rate at 46 bpm.  She had 11 episodes of brief SVT with the fastest interval lasting 12.2 seconds with a maximum rate at 235, averaging 204 bpm.  She had rare isolated PACs.  There were occasional isolated PVCs representing 2.7% of beats.  There were no episodes of ventricular couplets or triplets.  Presently, I am recommending the addition of low-dose metoprolol succinate 25 mg.  She will take this in the morning and should improve her ectopy which most likely occurs during the daytime  hours. 2. Right bundle branch block: Stable 3. History of possible remote apical MI secondary to vasospasm; her previous apical wall motion abnormality has resolved on subsequent studies.  Her last catheterization in 2015 showed normal coronary arteries and resolution of prior wall motion abnormality. 4. Idiopathic pulmonary fibrosis: Currently followed by Dr. Lake Bells, on [facility] with improvement in breathing. 5. Hypothyroidism: On Synthroid 6. Recent sinusitis: Currently on Augmentin 7. History of TIA: On Plavix  COVID-19 Education: The signs and symptoms of COVID-19 were discussed with the patient and how to seek care for testing (follow up with PCP or arrange E-visit).  The importance of social distancing was discussed today.  Time:   Today, I have spent 25 minutes with the patient with telehealth technology discussing the above problems.     Medication Adjustments/Labs and Tests Ordered: Current medicines are reviewed at length with the patient today.  Concerns regarding medicines are outlined above.   Tests Ordered: No orders of the defined types were placed in this encounter.   Medication Changes: No orders of the defined types were placed in this encounter.   Disposition:  Follow up 3 months  Signed, Shelva Majestic, MD  09/07/2018 4:35 PM    [place]

## 2018-09-13 ENCOUNTER — Telehealth: Payer: Self-pay | Admitting: Cardiovascular Disease

## 2018-09-13 DIAGNOSIS — J84112 Idiopathic pulmonary fibrosis: Secondary | ICD-10-CM | POA: Diagnosis not present

## 2018-09-13 NOTE — Telephone Encounter (Signed)
New message       Pt c/o medication issue:   1. Name of Medication:metoprolol succinate (TOPROL XL) 25 MG 24 hr tablet  2. How are you currently taking this medication (dosage and times per day)?1 time daily  3. Are you having a reaction (difficulty breathing--STAT)? n/a  4. What is your medication issue?patient states that this medication is causing diarrhea. Please call.

## 2018-09-13 NOTE — Telephone Encounter (Signed)
New medication per last telemedicine visit Routed to [facility] for med side effect review

## 2018-09-13 NOTE — Telephone Encounter (Signed)
Diarrhea usually occur in rare (less than 2% ) for patient using metoprolol.   Noted Ms Aron Baba was on recently on Augmentin therapy as well and this medication commonly causes diarrhea.  Recommendation:  1. Change metoprolol administration to evening (after supper, with full stomach) and re-assess GI symptoms

## 2018-09-24 DIAGNOSIS — Z1231 Encounter for screening mammogram for malignant neoplasm of breast: Secondary | ICD-10-CM | POA: Diagnosis not present

## 2018-09-28 DIAGNOSIS — M8588 Other specified disorders of bone density and structure, other site: Secondary | ICD-10-CM | POA: Diagnosis not present

## 2018-09-28 DIAGNOSIS — Z01419 Encounter for gynecological examination (general) (routine) without abnormal findings: Secondary | ICD-10-CM | POA: Diagnosis not present

## 2018-09-28 DIAGNOSIS — Z6824 Body mass index (BMI) 24.0-24.9, adult: Secondary | ICD-10-CM | POA: Diagnosis not present

## 2018-09-28 DIAGNOSIS — Z124 Encounter for screening for malignant neoplasm of cervix: Secondary | ICD-10-CM | POA: Diagnosis not present

## 2018-09-28 DIAGNOSIS — N958 Other specified menopausal and perimenopausal disorders: Secondary | ICD-10-CM | POA: Diagnosis not present

## 2018-09-28 LAB — HM DEXA SCAN

## 2018-09-30 ENCOUNTER — Telehealth: Payer: Self-pay | Admitting: Pulmonary Disease

## 2018-09-30 NOTE — Telephone Encounter (Signed)
Spoke with patient. She stated that she was doing well on Ofev and did not have any complaints. She said she spoke with [facility] last week and completed the questions over the phone.   However, while on the phone, she stated that she was waiting to be called to get scheduled for her PFT and OV. Advised her that I would check on the status of this for her. Did advise her that due to covid, patients are having to be tested for covid before having the PFT. She verbalized understanding.   Will check with Patrice to see if we can get her scheduled soon.   Nothing further needed at time of call.

## 2018-09-30 NOTE — Telephone Encounter (Signed)
Called patient back, went straight to VM. When patient calls back, please let someone in triage so that we can speak with her. Thanks.

## 2018-09-30 NOTE — Telephone Encounter (Signed)
Spoke with a rep with [employer] [facility]. Stated they have been trying to get in contact with the patient in regards to her Ofev. Advised them that she has not followed up with our office in 3 months but I will reach out to her.   Called patient and the call went straight to VM. Left message for patient to call back.

## 2018-09-30 NOTE — Telephone Encounter (Signed)
Patient is returning phone call. Patient phone number is [phone]. 

## 2018-10-04 DIAGNOSIS — J84112 Idiopathic pulmonary fibrosis: Secondary | ICD-10-CM | POA: Diagnosis not present

## 2018-10-12 DIAGNOSIS — J84112 Idiopathic pulmonary fibrosis: Secondary | ICD-10-CM | POA: Diagnosis not present

## 2018-10-17 DIAGNOSIS — J8410 Pulmonary fibrosis, unspecified: Secondary | ICD-10-CM | POA: Diagnosis not present

## 2018-10-31 ENCOUNTER — Other Ambulatory Visit: Payer: Self-pay | Admitting: Pulmonary Disease

## 2018-10-31 ENCOUNTER — Telehealth: Payer: Self-pay | Admitting: Pulmonary Disease

## 2018-11-01 NOTE — Telephone Encounter (Signed)
Pt has been scheduled PFT appt on 8/6. Nothing further needed.

## 2018-11-07 ENCOUNTER — Other Ambulatory Visit (HOSPITAL_COMMUNITY)
Admission: RE | Admit: 2018-11-07 | Discharge: 2018-11-07 | Disposition: A | Discharge status: Home / Self Care | Payer: Medicare Other | Source: Ambulatory Visit | Attending: Pulmonary Disease | Admitting: Pulmonary Disease

## 2018-11-07 ENCOUNTER — Other Ambulatory Visit: Payer: Self-pay

## 2018-11-07 DIAGNOSIS — Z01812 Encounter for preprocedural laboratory examination: Secondary | ICD-10-CM | POA: Insufficient documentation

## 2018-11-07 DIAGNOSIS — Z20828 Contact with and (suspected) exposure to other viral communicable diseases: Secondary | ICD-10-CM | POA: Insufficient documentation

## 2018-11-07 LAB — SARS CORONAVIRUS 2 (TAT 6-24 HRS): SARS Coronavirus 2: NEGATIVE

## 2018-11-07 NOTE — Progress Notes (Signed)
@Patient  ID: Alexandria [name], female    DOB: 02/13/1946, 73 y.o.   MRN: [id number]  Chief Complaint  Patient presents with  . Results    Discuss results of PFT - ILD (Interstitial lung disease)    Referring provider: [name], *  HPI:  73 year old female former smoker followed in office for idiopathic pulmonary fibrosis recently started on 0FEV  PMH: Hypothyroidism, MI Smoker/ Smoking History: Former Smoker. 4 pack years.  Maintenance:  OFEV, [place] 100 Pt of: [place]    Synopsis: Former patient of Dr. Melvyn Novas with UACS and asthma who was found on chest imaging to have evidence of diffuse parenchymal lung disease.  She has a history of acid reflux and intolerance to antacids.  She treats her reflux with papaya. She says that she has had recurrent episodes of bronchitis on an annual basis for many years.  11/10/2018  - Visit   73 year old female former smoker followed in our office for IPF.  Patient is maintained on Ofev.  Patient is completing a visit today after completing pulmonary function test.  Pulmonary function test results are listed below:  11/10/2018-pulmonary function test-FVC 2.17 (75% predicted), postbronchodilator ratio 92, postbronchodilator FEV1 1.99 (91% predicted), no bronchodilator response, mid flow reversibility, DLCO 86  Patient is also followed by [facility] for management of her interstitial lung disease.  Patient is also been seen by Dr. Winn Jock at [facility] in [place].  [facility] assessment agreed that [facility] and our work-up is sufficient and to proceed forward with [facility]'s recommendations.  Patient reports that her breathing has been stable she has been walking every day.  She walked 2 miles yesterday including going up hills.  She is not having any issues at this time.    Tests:   Chest imaging: September 2019 high-resolution CT scan of the chest considered probable UIP, differential diagnosis includes  nonspecific interstitial pneumonitis.  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  PFT: September 2019 lung function testing ratio normal, FEV1 1.9 L 86% predicted, FVC 2.20 L 76% predicted, total lung capacity 3.95 L 78% predicted, DLCO 16.1 mL 66% predicted  11/10/2018-pulmonary function test-FVC 2.17 (75% predicted), postbronchodilator ratio 92, postbronchodilator FEV1 1.99 (91% predicted), no bronchodilator response, mid flow reversibility, DLCO 86  Labs: - Allergy profile 11/30/2016 >  Eos 0.3 /  IgE   slightly elevated at 129, RAST profile negative otherwise December 31, 2017 hypersensitivity pneumonitis panel negative, anti-Jo 1-, CCP negative, rheumatoid factor negative, SSA negative, SSB negative, SCL 70-.  ANA negative 01/2018 CBC with diff 400 eosinophils, ANCA test negative October 2019 bronchoscopy: Minimal white blood cells on cell count and differential, cytology showed minimal inflammatory change, just typical macrophages.  No malignant change.  Cultures negative  6 Min walk 06/2018 533m, O2 saturation 96% RA  Path:  Echo:  Heart Catheterization:  SIX MIN WALK 11/10/2018 06/14/2018  Medications - Synthroid 142mcg @ 0730, OFEV 150mg  @ 0745, Papaya @ 0800 and 0830, Tessalon 100mg  @ 0815, Plavix 75mg  @ 0830  Supplimental Oxygen during Test? (L/min) No No  Laps - 15  Partial Lap (in Meters) - 20  Baseline BP (sitting) - 134/84  Baseline Heartrate - 67  Baseline Dyspnea (Borg Scale) - 0  Baseline Fatigue (Borg Scale) - 1  Baseline SPO2 - 97  BP (sitting) - 150/86  Heartrate - 45  Dyspnea (Borg Scale) - 0  Fatigue (Borg Scale) - 0.5   SPO2 - 96  BP (sitting) - 138/80  Heartrate - 75  SPO2 - 96  Stopped or Paused before Six Minutes - No  Distance Completed - 530  Tech Comments: The patient was able to maintain a steady brisk walking pace and did not have to stop at any time. patient completed test at brisk pace, no rest breaks.  patient denied any symptoms of dyspnea, angina, dizzines, hip/leg/calf pain at end of test     FENO:  Lab Results  Component Value Date   NITRICOXIDE 40 11/30/2016    PFT: PFT Results Latest Ref Rng & Units 11/10/2018 12/30/2017  FVC-Pre L 2.17 2.10  FVC-Predicted Pre % 75 72  FVC-Post L 2.17 2.20  FVC-Predicted Post % 75 76  Pre FEV1/FVC % % 87 81  Post FEV1/FCV % % 92 85  FEV1-Pre L 1.90 1.70  FEV1-Predicted Pre % 87 78  FEV1-Post L 1.99 1.88  DLCO UNC% % 86 66  DLCO COR %Predicted % 119 100  TLC L 5.26 3.95  TLC % Predicted % 103 78  RV % Predicted % 134 85    Imaging: No results found.    Specialty Problems      Pulmonary Problems   DOE (dyspnea on exertion)   SOB (shortness of breath)   Asthmatic bronchitis with acute exacerbation   Upper airway cough syndrome    rx max gerd 02/24/15 and d/c qvar > 85% improved, still some pm coughing > rec add 1st gen H1 at hs > resolved, try off all rx 04/16/2015  - flared mid Feb 2017  and did not resume rx as rec> 06/11/2015 suggested she resume gerd/h1 and f/u prn  - UGI 09/22/16 Pos mild/mod GERD  - FENO 11/30/2016  =   40 during flare off all rx - Allergy profile 11/30/2016 >  Eos 0.3 /  IgE  129 neg RAST        Chronic cough   Acute respiratory failure (HCC)   Dyspnea   ILD (interstitial lung disease) ([place])    PFT: September 2019 lung function testing ratio normal, FEV1 1.9 L 86% predicted, FVC 2.20 L 76% predicted, total lung capacity 3.95 L 78% predicted, DLCO 16.1 mL 66% predicted  11/10/2018-pulmonary function test-FVC 2.17 (75% predicted), postbronchodilator ratio 92, postbronchodilator FEV1 1.99 (91% predicted), no  bronchodilator response, mid flow reversibility, DLCO 86  September 2019 high-resolution CT scan of the chest considered probable UIP, differential diagnosis includes nonspecific interstitial pneumonitis.  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  , there is no honeycombing, there is some traction bronchiectasis and there is peripheral based reticulation worse in the bases, patulous esophagus  December 31, 2017 hypersensitivity pneumonitis panel negative, anti-Jo 1-, CCP negative, rheumatoid factor negative, SSA negative, SSB negative, SCL 70-.  ANA negative 01/2018 CBC with diff 400 eosinophils, ANCA test negative October 2019 bronchoscopy: Minimal white blood cells on cell count and differential, cytology showed minimal inflammatory change, just typical macrophages.  No malignant change.  Cultures negative      Allergic rhinitis   URI (upper respiratory infection)   IPF (idiopathic pulmonary fibrosis) (HCC)   Pansinusitis      Allergies  Allergen Reactions  . Prednisone Other (See  Comments)    Changed personality   . Atrovent Hfa [Ipratropium Bromide Hfa] Other (See Comments)    Pt could not sleep  . Cheratussin Ac [Guaifenesin-Codeine]   . Codeine Other (See Comments)    Pt takes promethazine with codeine at home  . Hydrocodone Itching  . Influenza Vaccines Other (See Comments)    Pt reports heart attack after last flu shot  . Motrin [Ibuprofen] Other (See Comments)    "Gives false reading in blood"  . Oxycodone-Acetaminophen Itching    Immunization History  Administered Date(s) Administered  . Td 04/06/2013  . Tdap 04/06/1998   Recommended to the patient that she receive the Pneumovax 23 vaccine today.  She declined.  She reports that 1 time she had the flu vaccine and she had a heart attack.  She attributes this to a reaction that she can have from vaccines.  She reports that she will think about receiving the pneumonia  vaccine.   Past Medical History:  Diagnosis Date  . Arthritis   . Asthmatic bronchitis with acute exacerbation 02/06/2015  . Bronchial pneumonia   . Colon polyp   . Diverticulitis   . DVT (deep venous thrombosis) ([place])    lower extremity  . H/O cardiac catheterization 2004   Normal coronary arteries  . Heart murmur   . History of stress test 05/21/2011  . Hx of echocardiogram 01/27/2010   Normal Ef 55% the transmitral spectral doppler flow pattern is normal for age. the left ventricular wall motion is normal  . Migraines   . Pancreatitis   . Thyroid disease   . TIA (transient ischemic attack)    8 years ago    Tobacco History: Social History   Tobacco Use  Smoking Status Former Smoker  . Years: 2.00  . Quit date: 04/06/1969  . Years since quitting: 49.6  Smokeless Tobacco Never Used  Tobacco Comment   socially smoked x 2 years   Counseling given: Not Answered Comment: socially smoked x 2 years  Continue to not smoke  Outpatient Encounter Medications as of 11/10/2018  Medication Sig  . benzonatate (TESSALON) 100 MG capsule TAKE 1 CAPSULE BY MOUTH 3 TIMES A DAY AS NEEDED FOR COUGH  . Biotin (RA BIOTIN) 1000 MCG tablet Take 1,000 mcg by mouth 2 (two) times daily.   . clopidogrel (PLAVIX) 75 MG tablet TAKE 1 TABLET BY MOUTH EVERY DAY  . dicyclomine (BENTYL) 20 MG tablet TAKE 1 TABLET BY MOUTH FOUR TIMES A DAY AS NEEDED  . furosemide (LASIX) 40 MG tablet Take 1 tablet (40 mg total) by mouth daily. (Patient taking differently: Take 40 mg by mouth 2 (two) times daily. )  . KLOR-CON M20 20 MEQ tablet Take 20 mEq by mouth 2 (two) times daily.  . metoprolol succinate (TOPROL XL) 25 MG 24 hr tablet Take 1 tablet (25 mg total) by mouth daily.  . Multiple Vitamin (MULTIVITAMIN) tablet Take 1 tablet by mouth every other day. Gluten free/Vegetarian Multivitamin  . Nintedanib (OFEV) 150 MG CAPS Take 1 capsule (150 mg total) by mouth 2 (two) times daily.  Marland Kitchen SYNTHROID 112 MCG tablet  TAKE 1 TABLET BY MOUTH EVERY DAY BEFORE BREAKFAST  . vitamin C (ASCORBIC ACID) 500 MG tablet Take 1,000 mg by mouth 2 (two) times daily.   . vitamin E 1000 UNIT capsule Take 1,000 Units by mouth daily.  . [DISCONTINUED] amoxicillin-clavulanate (AUGMENTIN) 875-125 MG tablet Take 1 tablet by mouth 2 (two) times daily. (Patient not taking: Reported on  11/10/2018)   No facility-administered encounter medications on file as of 11/10/2018.      Review of Systems  Review of Systems  Constitutional: Negative for activity change, fatigue and fever.  HENT: Negative for sinus pressure, sinus pain and sore throat.   Respiratory: Negative for cough, shortness of breath and wheezing.   Cardiovascular: Negative for chest pain and palpitations.  Gastrointestinal: Negative for diarrhea, nausea and vomiting.  Musculoskeletal: Negative for arthralgias.  Neurological: Negative for dizziness.  Psychiatric/Behavioral: Negative for sleep disturbance. The patient is not nervous/anxious.      Physical Exam  BP 116/72 (BP Location: Left Arm, Patient Position: Sitting, Cuff Size: Normal)   Pulse 63   Temp 98.2 F (36.8 C)   Ht 5\' 4"  (1.626 m)   Wt 140 lb (63.5 kg)   SpO2 97%   BMI 24.03 kg/m   Wt Readings from Last 5 Encounters:  11/10/18 140 lb (63.5 kg)  09/07/18 134 lb (60.8 kg)  07/22/18 138 lb (62.6 kg)  06/23/18 138 lb 3.2 oz (62.7 kg)  06/14/18 138 lb (62.6 kg)    Physical Exam Vitals signs and nursing note reviewed.  Constitutional:      General: She is not in acute distress.    Appearance: Normal appearance. She is normal weight.  HENT:     Head: Normocephalic and atraumatic.     Right Ear: Tympanic membrane, ear canal and external ear normal. There is no impacted cerumen.     Left Ear: Tympanic membrane, ear canal and external ear normal. There is no impacted cerumen.     Nose: Nose normal. No congestion.     Mouth/Throat:     Mouth: Mucous membranes are moist.     Pharynx:  Oropharynx is clear.  Eyes:     Pupils: Pupils are equal, round, and reactive to light.  Neck:     Musculoskeletal: Normal range of motion.  Cardiovascular:     Rate and Rhythm: Normal rate and regular rhythm.     Pulses: Normal pulses.     Heart sounds: Normal heart sounds. No murmur.  Pulmonary:     Effort: Pulmonary effort is normal. No respiratory distress.     Breath sounds: No decreased air movement. Rales (Basilar rails, right greater than left) present. No decreased breath sounds or wheezing.  Skin:    General: Skin is warm and dry.     Capillary Refill: Capillary refill takes less than 2 seconds.  Neurological:     General: No focal deficit present.     Mental Status: She is alert and oriented to person, place, and time. Mental status is at baseline.     Gait: Gait normal.  Psychiatric:        Mood and Affect: Mood normal.        Behavior: Behavior normal.        Thought Content: Thought content normal.        Judgment: Judgment normal.      Lab Results:  CBC    Component Value Date/Time   WBC 5.7 07/22/2018 1352   RBC 4.48 07/22/2018 1352   HGB 15.1 (H) 07/22/2018 1352   HCT 43.9 07/22/2018 1352   PLT 212.0 07/22/2018 1352   MCV 98.0 07/22/2018 1352   MCH 32.9 01/06/2018 1324   MCHC 34.4 07/22/2018 1352   RDW 13.8 07/22/2018 1352   LYMPHSABS 2.2 07/22/2018 1352   MONOABS 0.8 07/22/2018 1352   EOSABS 0.5 07/22/2018 1352   BASOSABS 0.1 07/22/2018  1352    BMET    Component Value Date/Time   NA 140 11/10/2018 1110   K 3.2 (L) 11/10/2018 1110   CL 99 11/10/2018 1110   CO2 33 (H) 11/10/2018 1110   GLUCOSE 98 11/10/2018 1110   BUN 13 11/10/2018 1110   CREATININE 0.82 11/10/2018 1110   CREATININE 0.74 06/11/2015 1002   CALCIUM 9.4 11/10/2018 1110   GFRNONAA >60 01/10/2018 0438   GFRAA >60 01/10/2018 0438    BNP    Component Value Date/Time   BNP 270.1 (H) 01/07/2018 0237    ProBNP No results found for: PROBNP    Assessment & Plan:   ILD  (interstitial lung disease) ([place]) Plan: Continue OFEV Walk today Lab work today Continue to remain physically active We will work to get you established with Dr. Chase Caller Can also continue to follow-up with [facility] Spirometry with DLCO to be planned in 6 months [place] maintenance Recommended to the patient that she receive the Pneumovax 23 vaccine today.  She declined.  She reports that 1 time she had the flu vaccine and she had a heart attack.  She attributes this to a reaction that she can have from vaccines.  She reports that she will think about receiving the pneumonia vaccine.    Return in 6 weeks (on 12/22/2018), or if symptoms worsen or fail to improve, for Follow up with Dr. Purnell Shoemaker.   Lauraine Rinne, NP 11/10/2018   This appointment was 35 minutes long with over 50% of the time in direct face-to-face patient care, assessment, plan of care, and follow-up.

## 2018-11-10 ENCOUNTER — Ambulatory Visit (INDEPENDENT_AMBULATORY_CARE_PROVIDER_SITE_OTHER): Payer: Medicare Other | Admitting: Pulmonary Disease

## 2018-11-10 ENCOUNTER — Other Ambulatory Visit: Payer: Self-pay

## 2018-11-10 ENCOUNTER — Encounter: Payer: Self-pay | Admitting: Pulmonary Disease

## 2018-11-10 VITALS — BP 116/72 | HR 63 | Temp 98.2°F | Ht 64.0 in | Wt 140.0 lb

## 2018-11-10 DIAGNOSIS — J849 Interstitial pulmonary disease, unspecified: Secondary | ICD-10-CM

## 2018-11-10 DIAGNOSIS — Z0000 Encounter for general adult medical examination without abnormal findings: Secondary | ICD-10-CM | POA: Diagnosis not present

## 2018-11-10 DIAGNOSIS — Z5181 Encounter for therapeutic drug level monitoring: Secondary | ICD-10-CM | POA: Diagnosis not present

## 2018-11-10 DIAGNOSIS — J84112 Idiopathic pulmonary fibrosis: Secondary | ICD-10-CM

## 2018-11-10 LAB — PULMONARY FUNCTION TEST
DL/VA % pred: 119 %
DL/VA: 4.93 ml/min/mmHg/L
DLCO unc % pred: 86 %
DLCO unc: 16.6 ml/min/mmHg
FEF 25-75 Post: 3.06 L/sec
FEF 25-75 Pre: 2.08 L/sec
FEF2575-%Change-Post: 47 %
FEF2575-%Pred-Post: 174 %
FEF2575-%Pred-Pre: 118 %
FEV1-%Change-Post: 5 %
FEV1-%Pred-Post: 91 %
FEV1-%Pred-Pre: 87 %
FEV1-Post: 1.99 L
FEV1-Pre: 1.9 L
FEV1FVC-%Change-Post: 5 %
FEV1FVC-%Pred-Pre: 115 %
FEV6-%Change-Post: 0 %
FEV6-%Pred-Post: 78 %
FEV6-%Pred-Pre: 78 %
FEV6-Post: 2.17 L
FEV6-Pre: 2.17 L
FEV6FVC-%Pred-Post: 104 %
FEV6FVC-%Pred-Pre: 104 %
FVC-%Change-Post: 0 %
FVC-%Pred-Post: 75 %
FVC-%Pred-Pre: 75 %
FVC-Post: 2.17 L
FVC-Pre: 2.17 L
Post FEV1/FVC ratio: 92 %
Post FEV6/FVC ratio: 100 %
Pre FEV1/FVC ratio: 87 %
Pre FEV6/FVC Ratio: 100 %
RV % pred: 134 %
RV: 3.02 L
TLC % pred: 103 %
TLC: 5.26 L

## 2018-11-10 LAB — COMPREHENSIVE METABOLIC PANEL
ALT: 16 U/L (ref 0–35)
AST: 19 U/L (ref 0–37)
Albumin: 4.4 g/dL (ref 3.5–5.2)
Alkaline Phosphatase: 73 U/L (ref 39–117)
BUN: 13 mg/dL (ref 6–23)
CO2: 33 mEq/L — ABNORMAL HIGH (ref 19–32)
Calcium: 9.4 mg/dL (ref 8.4–10.5)
Chloride: 99 mEq/L (ref 96–112)
Creatinine, Ser: 0.82 mg/dL (ref 0.40–1.20)
GFR: 68.17 mL/min (ref >60.00)
Glucose, Bld: 98 mg/dL (ref 70–99)
Potassium: 3.2 mEq/L — ABNORMAL LOW (ref 3.5–5.1)
Sodium: 140 mEq/L (ref 135–145)
Total Bilirubin: 0.6 mg/dL (ref 0.2–1.2)
Total Protein: 7.3 g/dL (ref 6.0–8.3)

## 2018-11-10 LAB — HEPATIC FUNCTION PANEL
ALT: 16 U/L (ref 0–35)
AST: 19 U/L (ref 0–37)
Albumin: 4.4 g/dL (ref 3.5–5.2)
Alkaline Phosphatase: 73 U/L (ref 39–117)
Bilirubin, Direct: 0.1 mg/dL (ref 0.0–0.3)
Total Bilirubin: 0.6 mg/dL (ref 0.2–1.2)
Total Protein: 7.3 g/dL (ref 6.0–8.3)

## 2018-11-10 NOTE — Assessment & Plan Note (Addendum)
Plan: Continue OFEV Walk today Lab work today Continue to remain physically active We will work to get you established with Dr. Chase Caller Can also continue to follow-up with [facility] Spirometry with DLCO to be planned in 6 months Declined Pneumovax23

## 2018-11-10 NOTE — Assessment & Plan Note (Signed)
Recommended to the patient that she receive the Pneumovax 23 vaccine today.  She declined.  She reports that 1 time she had the flu vaccine and she had a heart attack.  She attributes this to a reaction that she can have from vaccines.  She reports that she will think about receiving the pneumonia vaccine.

## 2018-11-10 NOTE — Progress Notes (Signed)
Full PFT performed today. °

## 2018-11-10 NOTE — Progress Notes (Signed)
Reviewed, agree 

## 2018-11-10 NOTE — Progress Notes (Signed)
Lab work overall stable.  Liver function looks good.  Potassium is little low which has been the case for you over the past 9 months.  I would recommend that you increase your potassium in your diet such as spinach, bananas, etc.  If you would like I can prescribe some potassium tablets for you to take over the next couple of days and you can repeat blood work with primary care to ensure that it has come back up.  If patient agrees to potassium tablets please place order for:  Potassium 20 mEq Take 1 tablet daily for the next 5 days, then stop Total of 5 tablets  Follow-up with primary care for repeat blood work or can present to our office for a basic metabolic panel.  If patient would like to do lab work at our office go ahead and place order for basic metabolic panel in 2 weeks.  Wyn Quaker, FNP

## 2018-11-10 NOTE — Patient Instructions (Addendum)
Walk today   Labwork today   Idiopathic pulmonary fibrosis: Continue nintedanib as you are doing We will check a lung function test to assess disease activity in 6 months Practice good hand hygiene Stay physically active We will continue to check a comprehensive metabolic panel and liver function test every 3 months, the next will be arranged for November    Idiopathic Pulmonary Fibrosis Support Group  >>> [email or web address] >>>Contact Name: Marlane Mingle  Return in 6 weeks (on 12/22/2018), or if symptoms worsen or fail to improve, for Follow up with Dr. Purnell Shoemaker.   Coronavirus (COVID-19) Are you at risk?  Are you at risk for the Coronavirus (COVID-19)?  To be considered HIGH RISK for Coronavirus (COVID-19), you have to meet the following criteria:  . Traveled to [place], [place], [place], [place] or [place]; or in the [place] to [place], [place], [place], or [place]; and have fever, cough, and shortness of breath within the last 2 weeks of travel OR . Been in close contact with a person diagnosed with COVID-19 within the last 2 weeks and have fever, cough, and shortness of breath . IF YOU DO NOT MEET THESE CRITERIA, YOU ARE CONSIDERED LOW RISK FOR COVID-19.  What to do if you are HIGH RISK for COVID-19?  Marland Kitchen If you are having a medical emergency, call 911. . Seek medical care right away. Before you go to a doctor's office, urgent care or emergency department, call ahead and tell them about your recent travel, contact with someone diagnosed with COVID-19, and your symptoms. You should receive instructions from your physician's office regarding next steps of care.  . When you arrive at healthcare provider, tell the healthcare staff immediately you have returned from visiting [place], [place], [place], [place] or [place]; or traveled in the [place] to [place], [place], [place], or [place]; in the last two weeks or you have been in close contact with a person diagnosed  with COVID-19 in the last 2 weeks.   . Tell the health care staff about your symptoms: fever, cough and shortness of breath. . After you have been seen by a medical provider, you will be either: o Tested for (COVID-19) and discharged home on quarantine except to seek medical care if symptoms worsen, and asked to  - Stay home and avoid contact with others until you get your results (4-5 days)  - Avoid travel on public transportation if possible (such as bus, train, or airplane) or o Sent to the Emergency Department by EMS for evaluation, COVID-19 testing, and possible admission depending on your condition and test results.  What to do if you are LOW RISK for COVID-19?  Reduce your risk of any infection by using the same precautions used for avoiding the common cold or flu:  Marland Kitchen Wash your hands often with soap and warm water for at least 20 seconds.  If soap and water are not readily available, use an alcohol-based hand sanitizer with at least 60% alcohol.  . If coughing or sneezing, cover your mouth and nose by coughing or sneezing into the elbow areas of your shirt or coat, into a tissue or into your sleeve (not your hands). . Avoid shaking hands with others and consider head nods or verbal greetings only. . Avoid touching your eyes, nose, or mouth with unwashed hands.  . Avoid close contact with people who are sick. . Avoid places or events with large numbers of people in one location, like concerts  or sporting events. . Carefully consider travel plans you have or are making. . If you are planning any travel outside or inside the [place], visit the CDC's Travelers' Health webpage for the latest health notices. . If you have some symptoms but not all symptoms, continue to monitor at home and seek medical attention if your symptoms worsen. . If you are having a medical emergency, call 911.   [place] / e-Visit:  [email or web address]         [facility]: [facility]: [phone]                   [facility]: [phone]           It is flu season:   >>> Best ways to protect herself from the flu: Receive the yearly flu vaccine, practice good hand hygiene washing with soap and also using hand sanitizer when available, eat a nutritious meals, get adequate rest, hydrate appropriately   Please contact the office if your symptoms worsen or you have concerns that you are not improving.   Thank you for choosing [facility] for your healthcare, and for allowing [place] to partner with you on your healthcare journey. I am thankful to be able to provide care to you today.   Wyn Quaker FNP-C

## 2018-11-11 ENCOUNTER — Telehealth: Payer: Self-pay | Admitting: Pulmonary Disease

## 2018-11-11 NOTE — Progress Notes (Signed)
See phone note dated 11/11/18- pt notified of results and copy faxed to PCP

## 2018-11-11 NOTE — Telephone Encounter (Signed)
Notes recorded by Lauraine Rinne, NP on 11/10/2018 at 12:43 PM EDT  Lab work overall stable. Liver function looks good.   Potassium is little low which has been the case for you over the past 9 months. I would recommend that you increase your potassium in your diet such as spinach, bananas, etc. If you would like I can prescribe some potassium tablets for you to take over the next couple of days and you can repeat blood work with primary care to ensure that it has come back up.   If patient agrees to potassium tablets please place order for:   Potassium 20 mEq  Take 1 tablet daily for the next 5 days, then stop  Total of 5 tablets   Follow-up with primary care for repeat blood work or can present to our office for a basic metabolic panel. If patient would like to do lab work at our office go ahead and place order for basic metabolic panel in 2 weeks.   Wyn Quaker, FNP   Spoke with the pt about the above labs  She verbalized understanding of these  She states she eats a lot of K rich foods already  She is already taking Klor-con 20 meq bid to sometimes tid   Will forward to [place] to see if has any further recs, thanks

## 2018-11-11 NOTE — Telephone Encounter (Signed)
Spoke with the pt and notified of Brian's response  She verbalized understanding  I have faxed copy of results to her PCP

## 2018-11-11 NOTE — Telephone Encounter (Signed)
I am glad the patient is already eating lots of potassium rich foods.  She should follow-up with her primary care provider then for further investigation.  Patient still is hypokalemic on blood work.She can follow-up with primary care for additional recommendations.Wyn Quaker, FNP

## 2018-11-16 ENCOUNTER — Other Ambulatory Visit: Payer: Self-pay | Admitting: Family Medicine

## 2018-12-09 ENCOUNTER — Telehealth: Payer: Self-pay | Admitting: Internal Medicine

## 2018-12-09 NOTE — Telephone Encounter (Signed)
Needs appt with MR to est care in the ILD clinic  [facility]

## 2018-12-14 NOTE — Telephone Encounter (Signed)
Appt set for 12/20/18 with MR

## 2018-12-20 ENCOUNTER — Ambulatory Visit (INDEPENDENT_AMBULATORY_CARE_PROVIDER_SITE_OTHER): Payer: Medicare Other | Admitting: Internal Medicine

## 2018-12-20 ENCOUNTER — Other Ambulatory Visit: Payer: Self-pay

## 2018-12-20 ENCOUNTER — Encounter: Payer: Self-pay | Admitting: Internal Medicine

## 2018-12-20 VITALS — BP 128/66 | HR 76 | Temp 97.6°F | Ht 64.0 in | Wt 137.2 lb

## 2018-12-20 DIAGNOSIS — I201 Angina pectoris with documented spasm: Secondary | ICD-10-CM

## 2018-12-20 DIAGNOSIS — J849 Interstitial pulmonary disease, unspecified: Secondary | ICD-10-CM | POA: Diagnosis not present

## 2018-12-20 DIAGNOSIS — J84112 Idiopathic pulmonary fibrosis: Secondary | ICD-10-CM

## 2018-12-20 MED ORDER — OFEV 150 MG PO CAPS: 150.0000 mg | ORAL_CAPSULE | Freq: Two times a day (BID) | ORAL | 5 refills | Status: DC

## 2018-12-20 NOTE — Progress Notes (Signed)
Chief Complaint  Patient presents with  . Results    Discuss results of PFT - ILD (Interstitial lung disease)    Referring provider: [name], *  HPI:  73 year old female former smoker followed in office for idiopathic pulmonary fibrosis recently started on 0FEV  PMH: Hypothyroidism, MI Smoker/ Smoking History: Former Smoker. 4 pack years.  Maintenance:  OFEV, Dul[place] 100 Pt of: [place]    Synopsis: Former patient of Dr. Melvyn Novas with UACS and asthma who was found on chest imaging to have evidence of diffuse parenchymal lung disease.  She has a history of acid reflux and intolerance to antacids.  She treats her reflux with papaya. She says that she has had recurrent episodes of bronchitis on an annual basis for many years.  11/10/2018  - Visit   73 year old female former smoker followed in our office for IPF.  Patient is maintained on Ofev.  Patient is completing a visit today after completing pulmonary function test.  Pulmonary function test results are listed below:  11/10/2018-pulmonary function test-FVC 2.17 (75% predicted), postbronchodilator ratio 92, postbronchodilator FEV1 1.99 (91% predicted), no bronchodilator response, mid flow reversibility, DLCO 86  Patient is also followed by [facility] for management of her interstitial lung disease.  Patient is also been seen by Dr. Winn Jock at [facility] in [place].  [facility] agreed that [facility] and our work-up is sufficient and to proceed forward with [facility]'s recommendations.  Patient reports that her breathing has been stable she has been walking every day.  She walked 2 miles yesterday including going up hills.  She is not having any issues at this time.    Tests:   Chest imaging: September 2019 high-resolution CT scan of the chest considered probable UIP, differential diagnosis includes nonspecific interstitial pneumonitis.  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  PFT: September 2019 lung function testing ratio normal, FEV1 1.9 L 86% predicted, FVC 2.20 L 76% predicted, total lung capacity 3.95 L 78% predicted, DLCO 16.1 mL 66% predicted  11/10/2018-pulmonary function test-FVC 2.17 (75% predicted), postbronchodilator ratio 92, postbronchodilator FEV1 1.99 (91% predicted), no bronchodilator response, mid flow reversibility, DLCO 86  Labs: - Allergy profile 11/30/2016 >  Eos 0.3 /  IgE   slightly elevated at 129, RAST profile negative otherwise December 31, 2017 hypersensitivity pneumonitis panel negative, anti-Jo 1-, CCP negative, rheumatoid factor negative, SSA negative, SSB negative, SCL 70-.  ANA negative 01/2018 CBC with diff 400 eosinophils, ANCA test negative October 2019 bronchoscopy: Minimal white blood cells on cell count and differential, cytology showed minimal inflammatory change, just typical macrophages.  No malignant change.  Cultures negative  6 Min walk 06/2018 534m, O2 saturation 96% RA  Path:  Echo:  Heart Catheterization:  SIX MIN WALK 11/10/2018 06/14/2018  Medications - Synthroid 171mcg @ 0730, OFEV 150mg  @ 0745, Papaya @ 0800 and 0830, Tessalon 100mg  @ 0815, Plavix 75mg  @ 0830  Supplimental Oxygen during Test? (L/min) No No  Laps - 15  Partial Lap (in Meters) - 20  Baseline BP (sitting) - 134/84  Baseline Heartrate - 67  Baseline Dyspnea (Borg Scale) - 0  Baseline Fatigue (Borg Scale) - 1  Baseline SPO2 - 97  BP (sitting) - 150/86  Heartrate - 45  Dyspnea (Borg Scale) - 0  Fatigue (Borg Scale) - 0.5  SPO2 - 96  BP (sitting) - 138/80  Heartrate - 75  SPO2 - 96  Stopped or Paused before Six  Minutes - No  Distance Completed - 530  Tech Comments: The patient was able to maintain a steady brisk walking pace and did not have to stop at any time. patient completed test at brisk pace, no rest breaks.  patient denied any symptoms of dyspnea, angina, dizzines, hip/leg/calf pain at end of test    OV 12/20/2018  Subjective:  Patient ID: Alexandria [name], female , DOB: [birth year] , age 58 y.o. , MRN: [id number] , ADDRESS: [place] [address]   12/22/2018 -   Chief Complaint  Patient presents with  . IPF (idiopathic pulmonary fibrosis)      HPI ARDYTHE KLUTE 73 y.o. -transfer of care from Dr. Leilani Merl to Dr. Chase Caller in the ILD clinic.  This is because Dr. 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  Patient tells me that she is been given a diagnosis of IPF.  This is a clinical diagnosis without biopsy.  This is been supported both at [facility] and [employer] [facility].  I reviewed Dr. Lauris Chroman notes from end of 2019 and his diagnosis of IPF based on clinical grounds.  However she tells me that he did raise the possibility of non--IPF interstitial lung disease but I do not see this in the documentation.  She tells me since the diagnosis and starting nintedanib she is actually stable and is actually feeling better because of her intense exercise regimen.  She does have some difficulty tolerating nintedanib and 2 out of the 7 days because of abdominal pain she has only take 1 pill a day.  She relieves this abdominal pain with papaya.  She specifically wants to know from me if I support the diagnosis of IPF or if there alternate diagnosis I need to consider In addition she wanted to know about the role of nintedanib and fibrosis progression prevention.  However she is not interested in lung biopsy at this point.    [place] Integrated Comprehensive ILD Questionnaire  Symptoms:  -Insidious onset of  shortness of breath since June 2019 she states it is getting better because of extreme physical conditioning and working out.  No episodic dyspnea.  In fact currently she rates 0 dyspnea for rest or taking a shower or doing household work or shopping or walking at level at her own pace speaking.  No dyspnea for walking at level with others of age.  No dyspnea for walking up stairs or hill.  No other conditions are limiting her activities.  She did have a cough starting in June 2019 but it is better it is mild.  It is dry in quality occasionally clears her throat.  It is associated with allergies.  No other associated symptoms no wheezing.   Past Medical History : Positive for heart attack in December 2004.  Positive for thyroid disease.  Positive for stroke in October 2007.  Otherwise negative for asthma or COPD or rheumatoid arthritis or scleroderma or lupus or polymyositis or acid reflux or Sjogren's disease or sleep apnea or HIV or pulmonary hypertension or diabetes.  Negative for seizures tuberculosis kidney disease.  Negative for blood clots.  Negative for pleurisy  ROS:  -Positive for knee stiffness following knee replacement.  Sometimes she has dry eyes but otherwise no weight loss or fatigue color change in her fingers of fever or nausea vomiting or heartburn or rash or ulcers   FAMILY HISTORY of LUNG DISEASE: Positive for COPD but otherwise negative for fibrosis or asthma sarcoid   EXPOSURE HISTORY: She smokes cigarettes but quit in September 1971.  She does not smoke cigars or pipes or does any vaping.  No electronic cigarettes.  No marijuana.  No cocaine no intravenous drug use   HOME and HOBBY DETAILS : [place]..  Denies any damp living environment.  Denies mold or mildew in the shower curtain.  No humidifier use no CPAP use.  No nebulizer use.  No steam iron use no Jacuzzi use.  No Misty Fountain in the house.  She only has a cat for 19 years.   No pet gerbils.  No feather pillow use no feather duvet use.  No mold in the [facility] duct.  Does not play any musical instruments.  Does gardening   OCCUPATIONAL HISTORY (122 questions) : Essentially negative except gardening   PULMONARY TOXICITY HISTORY (27 items):  -Also negative.    CT chest high-resolution December 30, 2017 approximately 1 year ago: Personally visualized and agree with the suspicion of probable UIP  She has now joined a support group  Results for DEARIA, TRUMBAUER (MRN [id number]) as of 12/20/2018 12:14  Ref. Range 12/30/2017 09:02 11/10/2018 08:38  FVC-Pre Latest Units: L 2.10 2.17  FVC-%Pred-Pre Latest Units: % 72 75  Results for SAFIRA, MATTSSON (MRN [id number]) as of 12/20/2018 12:14  Ref. Range 12/30/2017 09:02 11/10/2018 08:38  DLCO unc Latest Units: ml/min/mmHg 16.06 16.60  DLCO unc % pred Latest Units: % 66 86   Results for YAZLYNN, SWIERK (MRN [id number]) as of 12/20/2018 12:14  Ref. Range 11/30/2016 11:22 12/31/2017 14:24  Anti Nuclear Antibody (ANA) Latest Ref Range: NEGATIVE   NEGATIVE  Anti JO-1 Latest Ref Range: 0.0 - 0.9 AI  [id number]  Cyclic Citrullin Peptide Ab Latest Units: UNITS  <16  RA Latex Turbid. Latest Ref Range: <14 IU/mL  <14  IgE (Immunoglobulin E), Serum Latest Ref Range: <115 kU/L 129 (H)   SSA (Ro) (ENA) Antibody, IgG Latest Ref Range: <1.0 NEG AI  <1.0 NEG  SSB (La) (ENA) Antibody, IgG Latest Ref Range: <1.0 NEG AI  <1.0 NEG  Scleroderma (Scl-70) (ENA) Antibody, IgG Latest Ref Range: <1.0 NEG AI  <1.0 NEG   ROS - per HPI     has a past medical history of Arthritis, Asthmatic bronchitis with acute exacerbation (02/06/2015), Bronchial pneumonia, Colon polyp, Diverticulitis, DVT (deep venous thrombosis) ([place]), H/O cardiac catheterization (2004), Heart murmur, History of stress test (05/21/2011), echocardiogram (01/27/2010), Migraines, Pancreatitis, Thyroid disease, and TIA (transient ischemic attack).   reports that she quit smoking  about 49 years ago. She quit after 2.00 years of use. She has never used smokeless tobacco.  Past Surgical History:  Procedure Laterality Date  . BREAST BIOPSY     Isaiah Blakes  . CARDIAC CATHETERIZATION     10/2013  . CATARACT EXTRACTION Right 03/22/2018  . LEFT HEART CATHETERIZATION WITH CORONARY ANGIOGRAM N/A 10/25/2013   Procedure: LEFT HEART CATHETERIZATION WITH CORONARY ANGIOGRAM;  Surgeon: Troy Sine, MD;  Location[facility] CATH LAB;  Service: Cardiovascular;  Laterality: N/A;  . NECK SURGERY     x's 2  . TOTAL KNEE ARTHROPLASTY  Bilateral x's 2  . VAGINAL HYSTERECTOMY  10/17/1998   [place] BRONCHOSCOPY Bilateral 01/24/2018   Procedure: VIDEO BRONCHOSCOPY WITH FLUORO;  Surgeon: [name], MD;  Location: [place];  Service: Cardiopulmonary;  Laterality: Bilateral;    Allergies  Allergen Reactions  . Prednisone Other (See Comments)    Changed personality   . Atrovent Hfa [Ipratropium Bromide Hfa] Other (See Comments)    Pt could not sleep  . Cheratussin Ac [Guaifenesin-Codeine]   . Codeine Other (See Comments)    Pt takes promethazine with codeine at home  . Hydrocodone Itching  . Influenza Vaccines Other (See Comments)    Pt reports heart attack after last flu shot  . Motrin [Ibuprofen] Other (See Comments)    "Gives false reading in blood"  . Oxycodone-Acetaminophen Itching    Immunization History  Administered Date(s) Administered  . Td 04/06/2013  . Tdap 04/06/1998    Family History  Problem Relation Age of Onset  . Arthritis Other   . Colon cancer Father   . Prostate cancer Father   . HIV Brother        54  . Kidney cancer Brother   . Colon cancer Brother   . Lung cancer Brother   . Other Brother        Mouth Cancer  . Ovarian cancer Mother   . Uterine cancer Mother   . Heart disease Maternal Grandmother   . Stroke Maternal Grandmother   . Hypertension Maternal Grandmother   . Diabetes Maternal Grandmother      Current  Outpatient Medications:  .  benzonatate (TESSALON) 100 MG capsule, TAKE 1 CAPSULE BY MOUTH 3 TIMES A DAY AS NEEDED FOR COUGH, Disp: 90 capsule, Rfl: 1 .  Biotin (RA BIOTIN) 1000 MCG tablet, Take 1,000 mcg by mouth 2 (two) times daily. , Disp: , Rfl:  .  clopidogrel (PLAVIX) 75 MG tablet, TAKE 1 TABLET BY MOUTH EVERY DAY, Disp: 30 tablet, Rfl: 5 .  dicyclomine (BENTYL) 20 MG tablet, TAKE 1 TABLET BY MOUTH FOUR TIMES A DAY AS NEEDED, Disp: 360 tablet, Rfl: 1 .  KLOR-CON M20 20 MEQ tablet, Take 20 mEq by mouth 2 (two) times daily. Will take 3 a daily only as needed., Disp: , Rfl:  .  metoprolol succinate (TOPROL XL) 25 MG 24 hr tablet, Take 1 tablet (25 mg total) by mouth daily., Disp: 90 tablet, Rfl: 1 .  Multiple Vitamin (MULTIVITAMIN) tablet, Take 1 tablet by mouth every other day. Gluten free/Vegetarian Multivitamin, Disp: , Rfl:  .  SYNTHROID 112 MCG tablet, TAKE 1 TABLET BY MOUTH EVERY DAY BEFORE BREAKFAST, Disp: 90 tablet, Rfl: 1 .  vitamin C (ASCORBIC ACID) 500 MG tablet, Take 1,000 mg by mouth 2 (two) times daily. , Disp: , Rfl:  .  vitamin E 1000 UNIT capsule, Take 1,000 Units by mouth daily., Disp: , Rfl:  .  furosemide (LASIX) 40 MG tablet, Take 1 tablet (40 mg total) by mouth daily., Disp: 30 tablet, Rfl: 3 .  Nintedanib (OFEV) 150 MG CAPS, Take 1 capsule (150 mg total) by mouth 2 (two) times daily., Disp: 60 capsule, Rfl: 5      Objective:   Vitals:   12/20/18 1201  BP: 128/66  Pulse: 76  Temp: 97.6 F (36.4 C)  SpO2: 98%  Weight: 137 lb 3.2 oz (62.2 kg)  Height: 5\' 4"  (1.626 m)    Estimated body mass index is 23.55 kg/m as calculated from the following:  Height as of this encounter: 5\' 4"  (1.626 m).   Weight as of this encounter: 137 lb 3.2 oz (62.2 kg).  @WEIGHTCHANGE @  [employer]   12/20/18 1201  Weight: 137 lb 3.2 oz (62.2 kg)     Physical Exam  General Appearance:    Alert, cooperative, no distress, appears stated age - yes , Deconditioned looking - no ,  OBESE  - no, Sitting on Wheelchair -  no  Head:    Normocephalic, without obvious abnormality, atraumatic  Eyes:    PERRL, conjunctiva/corneas clear,  Ears:    Normal TM's and external ear canals, both ears  Nose:   Nares normal, septum midline, mucosa normal, no drainage    or sinus tenderness. OXYGEN ON  - no . Patient is @ ra   Throat:   Lips, mucosa, and tongue normal; teeth and gums normal. Cyanosis on lips - no  Neck:   Supple, symmetrical, trachea midline, no adenopathy;    thyroid:  no enlargement/tenderness/nodules; no carotid   bruit or JVD  Back:     Symmetric, no curvature, ROM normal, no CVA tenderness  Lungs:     Distress - no , Wheeze no, Barrell Chest - no, Purse lip breathing - no, Crackles - yes at lung base   Chest Wall:    No tenderness or deformity.    Heart:    Regular rate and rhythm, S1 and S2 normal, no rub   or gallop, Murmur - no  Breast Exam:    NOT DONE  Abdomen:     Soft, non-tender, bowel sounds active all four quadrants,    no masses, no organomegaly. Visceral obesity - no  Genitalia:   NOT DONE  Rectal:   NOT DONE  Extremities:   Extremities - normal, Has Cane - no, Clubbing - no, Edema - no  Pulses:   2+ and symmetric all extremities  Skin:   Stigmata of Connective Tissue Disease - no  Lymph nodes:   Cervical, supraclavicular, and axillary nodes normal  Psychiatric:  Neurologic:   Pleasant - yes, Anxious - no, Flat affect - no  CAm-ICU - neg, Alert and Oriented x 3 - yes, Moves all 4s - yes, Speech - normal, Cognition - intact           Assessment:       ICD-10-CM   1. ILD (interstitial lung disease) ([place])  J84.9 Pulmonary function test    Hepatic function panel    CT Chest High Resolution    CANCELED: CT Chest High Resolution  2. IPF (idiopathic pulmonary fibrosis) (HCC)  [id number] Pulmonary function test    Hepatic function panel    CT Chest High Resolution    CANCELED: CT Chest High Resolution  3. Interstitial pulmonary disease (HCC)   J84.9 CT Chest High Resolution    CANCELED: CT Chest High Resolution       Plan:     Patient Instructions     ICD-10-CM   1. ILD (interstitial lung disease) ([place])  J84.9   2. IPF (idiopathic pulmonary fibrosis) (HCC)  J84.112     Clinically stable  Long discussion on ILD and its varieties and based on "probable  UIP" scan from a year ago - this is  > 70-80 odds you have IPF. However, the female gender and stability raise the possibility you have another variety such as NSIP  With IPF we treat with an anti-fibrotic such as nintedanib at the point of diagnosis.  However with  non--IPF diagnosis such as NSIP we do the same anti-fibrotic treatment [nintedanib] only when patients progress   pLAN - ILD questionnaire 12/20/2018    -At this point given the fact you have already been on treatment with nintedanib for over 9 months with relatively good tolerance except for 2 days of abdominal pain -I would say continue nintedanib with monitoring  -At this point we do not have to entertain lung biopsy but we can explore this in the future depending on clinical course especially with a newer technique call envisia that is less invasive than the surgicaL  Biopsy  -In 3 months please do spirometry and DLCO  -In 3 months do liver function test  -In 3 months do high-resolution CT chest (this repeat high-resolution CT chest can give clues about the probability of IPF)  - simple walk test at followup  -Continue the excellent great work you are doing with physical activity and exercise.  You are a role model  -Appreciate you joining the pulmonary fibrosis foundation support group  fOLLOWUP -In 3 months but after the above tests   > 50% of this > 40 min visit spent in face to face counseling or/and coordination of care - by this undersigned MD - Dr Brand Males. This includes one or more of the following documented above: discussion of test results, diagnostic or treatment recommendations,  prognosis, risks and benefits of management options, instructions, education, compliance or risk-factor reduction   SIGNATURE    Dr. Brand Males, M.D., F.C.C.P,  Pulmonary and Critical Care Medicine Staff Physician, [place] Director - Interstitial Lung Disease  Program  Pulmonary [place] at [place], [place], [address]  Pager: [phone], If no answer or between  15:00h - 7:00h: call [phone] Telephone: [phone]  3:13 PM 12/22/2018

## 2018-12-20 NOTE — Patient Instructions (Addendum)
ICD-10-CM   1. ILD (interstitial lung disease) ([place])  J84.9   2. IPF (idiopathic pulmonary fibrosis) (HCC)  J84.112     Clinically stable  Long discussion on ILD and its varieties and based on "probable  UIP" scan from a year ago - this is  > 70-80 odds you have IPF. However, the female gender and stability raise the possibility you have another variety such as NSIP  With IPF we treat with an anti-fibrotic such as nintedanib at the point of diagnosis.  However with non--IPF diagnosis such as NSIP we do the same anti-fibrotic treatment [nintedanib] only when patients progress   pLAN - ILD questionnaire 12/20/2018    -At this point given the fact you have already been on treatment with nintedanib for over 9 months with relatively good tolerance except for 2 days of abdominal pain -I would say continue nintedanib with monitoring  -At this point we do not have to entertain lung biopsy but we can explore this in the future depending on clinical course especially with a newer technique call envisia that is less invasive than the surgicaL  Biopsy  -In 3 months please do spirometry and DLCO  -In 3 months do liver function test  -In 3 months do high-resolution CT chest (this repeat high-resolution CT chest can give clues about the probability of IPF)  - simple walk test at followup  -Continue the excellent great work you are doing with physical activity and exercise.  You are a role model  -Appreciate you joining the pulmonary fibrosis foundation support group  fOLLOWUP -In 3 months but after the above tests

## 2018-12-21 ENCOUNTER — Telehealth: Payer: Self-pay | Admitting: Internal Medicine

## 2018-12-21 NOTE — Telephone Encounter (Signed)
LVM to advise the patient of Dr. Golden Pop request regarding paperwork drop off. And to call back to confirm the message has been received.  Message left in triage as an FYI.

## 2018-12-21 NOTE — Telephone Encounter (Signed)
Pt calling back to inform nnurse that she did receivwe msg a/b forms and she is filling them out to be brought back.Alexandria [name]

## 2018-12-21 NOTE — Telephone Encounter (Signed)
Called and spoke with pt to let her know we received her message and to inquire when she plans on bringing the ILD Questionnaire back in. Pt states she plans on bringing them by tomorrow, as she has a maintenance worker coming to her house today. I let her know I would put a message back, which pt expressed understanding to. Routing to [place] and MR as an Pharmacist, hospital. Nothing further needed at this time.

## 2018-12-21 NOTE — Telephone Encounter (Signed)
Hi Triage  Could you tell patient Alexandria [name] to drop off ILD questionnaire today if possible so I can dicatate into my note and sign off by end of this week? Please let me know. She left clinic with the packet   THanks  MR

## 2018-12-21 NOTE — Telephone Encounter (Signed)
Message received and routed to Dr. Chase Caller as an Juluis Rainier.

## 2018-12-22 ENCOUNTER — Other Ambulatory Visit: Payer: Self-pay | Admitting: Family Medicine

## 2018-12-22 ENCOUNTER — Telehealth: Payer: Self-pay | Admitting: Internal Medicine

## 2018-12-22 DIAGNOSIS — Z0000 Encounter for general adult medical examination without abnormal findings: Secondary | ICD-10-CM

## 2018-12-22 MED ORDER — FUROSEMIDE 40 MG PO TABS: 40.0000 mg | ORAL_TABLET | Freq: Every day | ORAL | 3 refills | Status: DC

## 2018-12-22 NOTE — Telephone Encounter (Signed)
Requested medication (s) are due for refill today: yes  Requested medication (s) are on the active medication list: yes  Last refill:  01/11/2018  Future visit scheduled: no  Notes to clinic:  Review for refill   Requested Prescriptions  Pending Prescriptions Disp Refills   furosemide (LASIX) 40 MG tablet 30 tablet 0    Sig: Take 1 tablet (40 mg total) by mouth daily.     Cardiovascular:  Diuretics - Loop Failed - 12/22/2018  9:31 AM      Failed - K in normal range and within 360 days    Potassium  Date Value Ref Range Status  11/10/2018 3.2 (L) 3.5 - 5.1 mEq/L Final         Passed - Ca in normal range and within 360 days    Calcium  Date Value Ref Range Status  11/10/2018 9.4 8.4 - 10.5 mg/dL Final         Passed - Na in normal range and within 360 days    Sodium  Date Value Ref Range Status  11/10/2018 140 135 - 145 mEq/L Final         Passed - Cr in normal range and within 360 days    Creat  Date Value Ref Range Status  06/11/2015 0.74 0.60 - 0.93 mg/dL Final   Creatinine, Ser  Date Value Ref Range Status  11/10/2018 0.82 0.40 - 1.20 mg/dL Final         Passed - Last BP in normal range    BP Readings from Last 1 Encounters:  12/20/18 128/66         Passed - Valid encounter within last 6 months    Recent Outpatient Visits          3 months ago Acute pansinusitis, recurrence not specified   [facility] [place] R, DO   5 months ago Bradycardia   [facility] [place] R, DO   5 months ago    [facility], [place] R, DO   6 months ago Pansinusitis, unspecified chronicity   [facility], [place] R, DO   6 months ago Acute non-recurrent pansinusitis   [facility] [place], DO      Future  Appointments            In 1 month Troy Sine, MD [place], [facility]

## 2018-12-22 NOTE — Telephone Encounter (Signed)
Medication: furosemide (LASIX) 40 MG tablet [id number]   Pharmacy:  [id number] - [place],  - [address] AT [place] [phone] (Phone) [phone] (Fax)   Pt aware of turn around time.

## 2018-12-22 NOTE — Telephone Encounter (Signed)
The ILD questionaire dropped off by the patient was given to Dr. Chase Caller earlier today so he could complete his office note on the patient. The plan from the office note dated 12/22/18 (when note was completed based on returned ILD info from patient):   "pLAN - ILD questionnaire 12/20/2018    -At this point given the fact you have already been on treatment with nintedanib for over 9 months with relatively good tolerance except for 2 days of abdominal pain -I would say continue nintedanib with monitoring  -At this point we do not have to entertain lung biopsy but we can explore this in the future depending on clinical course especially with a newer technique call envisia that is less invasive than the surgicaL  Biopsy  -In 3 months please do spirometry and DLCO  -In 3 months do liver function test  -In 3 months do high-resolution CT chest (this repeat high-resolution CT chest can give clues about the probability of IPF)  - simple walk test at followup  -Continue the excellent great work you are doing with physical activity and exercise.  You are a role model  -Appreciate you joining the pulmonary fibrosis foundation support group  fOLLOWUP' -In 3 months but after the above test"  I called the patient back regarding the information above and that there was nothing noted about physical therapy, but I would ask Dr. Chase Caller. The patient stated she mentioned it during the visit, but Dr. Chase Caller started talking about something else and it was not discussed in further detail.  Message routed to Dr. Chase Caller for response.

## 2018-12-23 ENCOUNTER — Telehealth: Payer: Self-pay | Admitting: Family Medicine

## 2018-12-23 ENCOUNTER — Telehealth: Payer: Self-pay | Admitting: Internal Medicine

## 2018-12-23 MED ORDER — FUROSEMIDE 40 MG PO TABS: 40.0000 mg | ORAL_TABLET | Freq: Two times a day (BID) | ORAL | 1 refills | Status: DC

## 2018-12-23 NOTE — Telephone Encounter (Signed)
Called the patient back and she stated she wanted to go back to [employer] [facility] [employer] [facility] [address]. Wanted to work on upper body exercises and exercise to help keep her lungs strong.  Are you in agreement with this?

## 2018-12-23 NOTE — Telephone Encounter (Signed)
Copied from [place] [id number]. Topic: General - Other >> Dec 23, 2018  8:25 AM Keene Breath wrote: Reason for CRM: Patient called to ask the nurse to call regarding the patient's dosage on her medication, furosemide (LASIX) 40 MG tablet.  Please call patient as early as possible because she has some family issues that she has to take care of this morning.  CB# [phone]

## 2018-12-23 NOTE — Telephone Encounter (Signed)
Order was placed for [employer] [facility] [employer] [address] - upper body exercises and exercise to help keep lungs strong 2-3 x a week for 24 visits.  I called [facility] and spoke to [place].  She states they do not take referral forms.  Pt has to get an insurance id # from her insurance company to bring to them.  I called the pt & told her what Jonelle Sidle said.  She is going to contact her ins and have them to call us if they need anything.  Nothing further needed.

## 2018-12-23 NOTE — Telephone Encounter (Signed)
Already left a voice mail for the patient after the call was cut off earlier. The original message sent to Dr. Chase Caller for response.

## 2018-12-23 NOTE — Telephone Encounter (Signed)
Yes that is fine

## 2018-12-23 NOTE — Telephone Encounter (Signed)
She is very fit. I do not recollect discussing PT. What kind of PT does she want?

## 2018-12-23 NOTE — Telephone Encounter (Signed)
MR how many visits for physical therapy do you want patient to have? This is the patient's request for an order so insurance will pay for her working up upper body and helping her lungs

## 2018-12-23 NOTE — Telephone Encounter (Signed)
Dictated into my hpoi

## 2018-12-23 NOTE — Telephone Encounter (Signed)
Typically it is 2-3 x / week for a total of 24 sessions - pls make sure patient is ok with that

## 2018-12-23 NOTE — Telephone Encounter (Signed)
Called spoke with patient she said that was perfect.  Order placed Nothing further needed

## 2018-12-23 NOTE — Telephone Encounter (Signed)
Patient usually takes 40mg  bid.  rx was changed when she went to ED and [place] changed back.  Rx for 40mg  bid sent in.  Patient notified.

## 2018-12-26 ENCOUNTER — Telehealth: Payer: Self-pay | Admitting: Internal Medicine

## 2018-12-26 NOTE — Telephone Encounter (Signed)
Call returned to patient, she reports her insurance [employer] is requiring a letter stating she can have physical therapy. She reports insurance states in order for them to cover it they need the letter. Aware MR will be back in clinic 09/24.   MR please advise if this is okay:  To whom it may concern:  This patient is seen in our clinic for ILD. It is my medical opinion to begin PT as soon as possible to optimize her lung function and overall health. I recommend upper body exercises as well as some cardio 2-3x/per week for approximately 24 sessions.

## 2018-12-27 NOTE — Telephone Encounter (Signed)
Noted. Letter has been printed and placed on your desk. Will update encounter once signed.

## 2018-12-27 NOTE — Telephone Encounter (Signed)
Sounds good. 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

## 2018-12-28 ENCOUNTER — Other Ambulatory Visit: Payer: Self-pay

## 2018-12-29 ENCOUNTER — Other Ambulatory Visit: Payer: Self-pay

## 2018-12-29 ENCOUNTER — Ambulatory Visit (INDEPENDENT_AMBULATORY_CARE_PROVIDER_SITE_OTHER): Payer: Medicare Other | Admitting: Family Medicine

## 2018-12-29 DIAGNOSIS — J0140 Acute pansinusitis, unspecified: Secondary | ICD-10-CM

## 2018-12-29 DIAGNOSIS — I201 Angina pectoris with documented spasm: Secondary | ICD-10-CM

## 2018-12-29 MED ORDER — AMOXICILLIN-POT CLAVULANATE 875-125 MG PO TABS: 1.0000 | ORAL_TABLET | Freq: Two times a day (BID) | ORAL | 0 refills | Status: DC

## 2018-12-29 NOTE — Progress Notes (Signed)
Virtual Visit via Video Note  I connected with Alexandria [name] on 12/29/18 at  3:40 PM EDT by a video enabled telemedicine application and verified that I am speaking with the correct person using two identifiers.  Location: Patient: home  Provider: office    I discussed the limitations of evaluation and management by telemedicine and the availability of in person appointments. The patient expressed understanding and agreed to proceed.  History of Present Illness: Pt home c/o sinus ha and congestion x 3 weeks ---- she had covid test and it was neg  No fevers     Observations/Objective: No vitals obtained Pt is in NAD Assessment and Plan: 1. Acute non-recurrent pansinusitis Use saline spray  abx per orders  Call office if no improvement  - amoxicillin-clavulanate (AUGMENTIN) 875-125 MG tablet; Take 1 tablet by mouth 2 (two) times daily.  Dispense: 20 tablet; Refill: 0   Follow Up Instructions:    I discussed the assessment and treatment plan with the patient. The patient was provided an opportunity to ask questions and all were answered. The patient agreed with the plan and demonstrated an understanding of the instructions.   The patient was advised to call back or seek an in-person evaluation if the symptoms worsen or if the condition fails to improve as anticipated.  I provided 15 minutes of non-face-to-face time during this encounter.   [name], DO

## 2018-12-29 NOTE — Telephone Encounter (Signed)
Letter signed and given to [employer] 12/29/2018

## 2018-12-29 NOTE — Telephone Encounter (Addendum)
lmtcb for pt. Need to know where to fax this. Letter is the TL office on the cork board.

## 2018-12-30 NOTE — Telephone Encounter (Signed)
Pt returned call.Stated that paperwork needs to be faxed to [facility]. If any further questions give her a call back

## 2018-12-30 NOTE — Telephone Encounter (Signed)
ATC patient , not sure the fax number with [employer]. She doesn't have a card on file with them.   Unable to reach patient, left message to let us know the fax number in which the letter needed to be sent to as we did not have any information from [place] as her insurance scanned in the chart.

## 2019-01-01 ENCOUNTER — Encounter: Payer: Self-pay | Admitting: Family Medicine

## 2019-01-02 DIAGNOSIS — J84112 Idiopathic pulmonary fibrosis: Secondary | ICD-10-CM | POA: Diagnosis not present

## 2019-01-02 DIAGNOSIS — M6281 Muscle weakness (generalized): Secondary | ICD-10-CM | POA: Diagnosis not present

## 2019-01-02 NOTE — Telephone Encounter (Signed)
LMTCB

## 2019-01-03 DIAGNOSIS — L84 Corns and callosities: Secondary | ICD-10-CM | POA: Diagnosis not present

## 2019-01-03 DIAGNOSIS — L821 Other seborrheic keratosis: Secondary | ICD-10-CM | POA: Diagnosis not present

## 2019-01-03 DIAGNOSIS — D225 Melanocytic nevi of trunk: Secondary | ICD-10-CM | POA: Diagnosis not present

## 2019-01-03 DIAGNOSIS — L603 Nail dystrophy: Secondary | ICD-10-CM | POA: Diagnosis not present

## 2019-01-03 DIAGNOSIS — D2272 Melanocytic nevi of left lower limb, including hip: Secondary | ICD-10-CM | POA: Diagnosis not present

## 2019-01-03 DIAGNOSIS — L814 Other melanin hyperpigmentation: Secondary | ICD-10-CM | POA: Diagnosis not present

## 2019-01-03 DIAGNOSIS — D1801 Hemangioma of skin and subcutaneous tissue: Secondary | ICD-10-CM | POA: Diagnosis not present

## 2019-01-03 DIAGNOSIS — D2271 Melanocytic nevi of right lower limb, including hip: Secondary | ICD-10-CM | POA: Diagnosis not present

## 2019-01-03 NOTE — Telephone Encounter (Signed)
Pt returning call.Alexandria [name] ° °

## 2019-01-03 NOTE — Telephone Encounter (Signed)
Pt returning call and is going to call the insurance compy and call us back.Alexandria [name]

## 2019-01-03 NOTE — Telephone Encounter (Signed)
ATC Patient.  LM to call back when available.  

## 2019-01-04 NOTE — Telephone Encounter (Signed)
LMTCB x1 for pt to see if she got the fax number to [facility].

## 2019-01-05 NOTE — Telephone Encounter (Signed)
Called the pt  I advised that we do not have a [employer] card on file for her  She states it's called the [employer] plan  I was able to locate this card in her chart and called the number on it [phone]  Faxed letter to [phone] with pt's name and id number on the cover sheet  Called and left a detailed msg on machine letting the pt know that this was done

## 2019-01-05 NOTE — Telephone Encounter (Signed)
Pt returning call and did not get fax number # they would not give the information to her told her that it needed  To come from Dr office if you need to speak to her again you can reach her @ [phone].Alexandria [name]

## 2019-01-11 DIAGNOSIS — M6281 Muscle weakness (generalized): Secondary | ICD-10-CM | POA: Diagnosis not present

## 2019-01-11 DIAGNOSIS — J84112 Idiopathic pulmonary fibrosis: Secondary | ICD-10-CM | POA: Diagnosis not present

## 2019-01-16 ENCOUNTER — Other Ambulatory Visit: Payer: Self-pay | Admitting: Family Medicine

## 2019-01-16 ENCOUNTER — Telehealth: Payer: Self-pay | Admitting: *Deleted

## 2019-01-16 DIAGNOSIS — Z79899 Other long term (current) drug therapy: Secondary | ICD-10-CM

## 2019-01-16 NOTE — Telephone Encounter (Signed)
Looks like this was suppose to be pulmonology, but can we order for her?

## 2019-01-16 NOTE — Telephone Encounter (Signed)
Copied from [place] [id number]. Topic: Appointment Scheduling - Scheduling Inquiry for Clinic >> Jan 05, 2019 10:30 AM Rutherford Nail, NT wrote: Reason for CRM: Patient calling and states that she is needing some blood work done to check her liver function since she is on OFEV. Would like a call to get this scheduled once orders are placed. Please advise. >> Jan 16, 2019  9:38 AM Percell Belt A wrote: Pt called in and state that she never rec'd a call about that's this .  Best number [phone]

## 2019-01-16 NOTE — Telephone Encounter (Signed)
pulm already ordered it for elam probably

## 2019-01-17 NOTE — Telephone Encounter (Signed)
Patient notified to call pulmonologist office.

## 2019-01-18 ENCOUNTER — Telehealth: Payer: Self-pay | Admitting: Internal Medicine

## 2019-01-18 ENCOUNTER — Other Ambulatory Visit (INDEPENDENT_AMBULATORY_CARE_PROVIDER_SITE_OTHER): Payer: Medicare Other

## 2019-01-18 DIAGNOSIS — J849 Interstitial pulmonary disease, unspecified: Secondary | ICD-10-CM | POA: Diagnosis not present

## 2019-01-18 DIAGNOSIS — E039 Hypothyroidism, unspecified: Secondary | ICD-10-CM | POA: Diagnosis not present

## 2019-01-18 DIAGNOSIS — J84112 Idiopathic pulmonary fibrosis: Secondary | ICD-10-CM | POA: Diagnosis not present

## 2019-01-18 LAB — HEPATIC FUNCTION PANEL
ALT: 19 U/L (ref 0–35)
AST: 21 U/L (ref 0–37)
Albumin: 4 g/dL (ref 3.5–5.2)
Alkaline Phosphatase: 68 U/L (ref 39–117)
Bilirubin, Direct: 0.1 mg/dL (ref 0.0–0.3)
Total Bilirubin: 0.6 mg/dL (ref 0.2–1.2)
Total Protein: 6.8 g/dL (ref 6.0–8.3)

## 2019-01-18 LAB — TSH: TSH: 1.35 u[IU]/mL (ref 0.35–4.50)

## 2019-01-18 LAB — T4, FREE: Free T4: 1.38 ng/dL (ref 0.60–1.60)

## 2019-01-18 NOTE — Telephone Encounter (Signed)
Called and spoke to patient. Patient came in this morning for a hepatic function panel and would like to have a thyroid panel order added to her blood draw. Patient stated that she usually gets that done at PCP who writes for her Synthroid but she was hoping not to have to get stuck again.  Dr. Chase Caller, please advise.

## 2019-01-18 NOTE — Telephone Encounter (Addendum)
Called and spoke w/ pt regarding MR's approval of placing TSH and FT4 lab orders. Pt verbalized understanding with no additional questions. Orders have been placed. Nothing further needed at this time.

## 2019-01-18 NOTE — Telephone Encounter (Signed)
Yes tsh and FT4 ok to order  THanks    SIGNATURE    Dr. Brand Males, M.D., F.C.C.P,  Pulmonary and Critical Care Medicine Staff Physician, [place] Director - Interstitial Lung Disease  Program  P[facility] [place] [facility] [place], [place], [address]  Pager: [phone], If no answer or between  15:00h - 7:00h: call [phone] Telephone: [phone]  1:01 PM 01/18/2019

## 2019-01-23 ENCOUNTER — Other Ambulatory Visit: Payer: Self-pay | Admitting: Family Medicine

## 2019-01-23 DIAGNOSIS — J0140 Acute pansinusitis, unspecified: Secondary | ICD-10-CM

## 2019-01-23 DIAGNOSIS — J84112 Idiopathic pulmonary fibrosis: Secondary | ICD-10-CM | POA: Diagnosis not present

## 2019-01-23 DIAGNOSIS — M6281 Muscle weakness (generalized): Secondary | ICD-10-CM | POA: Diagnosis not present

## 2019-01-23 NOTE — Telephone Encounter (Signed)
Medication Refill - Medication: amoxicillin-clavulanate (AUGMENTIN) 875-125 MG tablet  Has the patient contacted their pharmacy? No - states that this medication is the first time in a year that her sinus' have drained and she wants to remain on the medication.  Pt wants to know if she can just have a refill or if she needs to come in for a visit. (Agent: If no, request that the patient contact the pharmacy for the refill.) (Agent: If yes, when and what did the pharmacy advise?)  Preferred Pharmacy (with phone number or street name):  [id number] - [place], [address] AT [address] [phone] (Phone) [phone] (Fax)   Agent: Please be advised that RX refills may take up to 3 business days. We ask that you follow-up with your pharmacy.

## 2019-01-23 NOTE — Telephone Encounter (Signed)
Requested medication (s) are due for refill today: no  Requested medication (s) are on the active medication list: yes  Last refill:  12/29/2018  Future visit scheduled: no  Notes to clinic:  Review for refill   Requested Prescriptions  Pending Prescriptions Disp Refills   amoxicillin-clavulanate (AUGMENTIN) 875-125 MG tablet 20 tablet 0    Sig: Take 1 tablet by mouth 2 (two) times daily.     Off-Protocol Failed - 01/23/2019  9:09 AM      Failed - Medication not assigned to a protocol, review manually.      Passed - Valid encounter within last 12 months    Recent Outpatient Visits          3 weeks ago Acute non-recurrent pansinusitis   [facility] [place], [place]   4 months ago Acute pansinusitis, recurrence not specified   [facility] [place] [facility], [place]   6 months ago Bradycardia   [facility] [place] [facility], DO   6 months ago    [facility] [place] R, DO   7 months ago Pansinusitis, unspecified chronicity   [facility] [place], DO      Future Appointments            In 4 days Troy Sine, MD [facility]

## 2019-01-24 NOTE — Telephone Encounter (Signed)
Got results and know you did! put liver is perfect excited, making appt

## 2019-01-24 NOTE — Progress Notes (Signed)
PT aware of results Nothing further needed.

## 2019-01-26 ENCOUNTER — Ambulatory Visit (INDEPENDENT_AMBULATORY_CARE_PROVIDER_SITE_OTHER): Payer: Medicare Other | Admitting: Family Medicine

## 2019-01-26 ENCOUNTER — Encounter: Payer: Self-pay | Admitting: Family Medicine

## 2019-01-26 ENCOUNTER — Other Ambulatory Visit: Payer: Self-pay

## 2019-01-26 VITALS — BP 100/66 | HR 76 | Temp 97.3°F | Resp 18 | Ht 64.0 in | Wt >= 6400 oz

## 2019-01-26 DIAGNOSIS — I201 Angina pectoris with documented spasm: Secondary | ICD-10-CM

## 2019-01-26 DIAGNOSIS — I10 Essential (primary) hypertension: Secondary | ICD-10-CM | POA: Diagnosis not present

## 2019-01-26 DIAGNOSIS — J0140 Acute pansinusitis, unspecified: Secondary | ICD-10-CM

## 2019-01-26 DIAGNOSIS — E039 Hypothyroidism, unspecified: Secondary | ICD-10-CM

## 2019-01-26 DIAGNOSIS — J84112 Idiopathic pulmonary fibrosis: Secondary | ICD-10-CM

## 2019-01-26 MED ORDER — AMOXICILLIN-POT CLAVULANATE 875-125 MG PO TABS: 1.0000 | ORAL_TABLET | Freq: Two times a day (BID) | ORAL | 0 refills | Status: DC

## 2019-01-26 NOTE — Progress Notes (Addendum)
Patient ID: Alexandria [name], female    DOB: May 07, 1945  Age: 73 y.o. MRN: [id number]    Subjective:  Subjective  HPI Alexandria [name] presents for lab work and presents with sinus congestion -- she told the scheduler she had sinus congestion but an appointment was made in the office anyway.    Review of Systems  Constitutional: Positive for chills. Negative for appetite change, diaphoresis, fatigue, fever and unexpected weight change.  HENT: Positive for sinus pressure, sinus pain and sneezing.   Eyes: Negative for pain, redness and visual disturbance.  Respiratory: Negative for cough, chest tightness, shortness of breath and wheezing.   Cardiovascular: Negative for chest pain, palpitations and leg swelling.  Endocrine: Negative for cold intolerance, heat intolerance, polydipsia, polyphagia and polyuria.  Genitourinary: Negative for difficulty urinating, dysuria and frequency.  Neurological: Negative for dizziness, light-headedness, numbness and headaches.    History Past Medical History:  Diagnosis Date  . Arthritis   . Asthmatic bronchitis with acute exacerbation 02/06/2015  . Bronchial pneumonia   . Colon polyp   . Diverticulitis   . DVT (deep venous thrombosis) ([place])    lower extremity  . H/O cardiac catheterization 2004   Normal coronary arteries  . Heart murmur   . History of stress test 05/21/2011  . Hx of echocardiogram 01/27/2010   Normal Ef 55% the transmitral spectral doppler flow pattern is normal for age. the left ventricular wall motion is normal  . Migraines   . Pancreatitis   . Thyroid disease   . TIA (transient ischemic attack)    8 years ago    She has a past surgical history that includes Vaginal hysterectomy (10/17/1998); Breast biopsy; Total knee arthroplasty; Neck surgery; Cardiac catheterization; left heart catheterization with coronary angiogram (N/A, 10/25/2013); Video bronchoscopy (Bilateral, 01/24/2018); and Cataract extraction (Right,  03/22/2018).   Her family history includes Arthritis in an other family member; Colon cancer in her brother and father; Diabetes in her maternal grandmother; HIV in her brother; Heart disease in her maternal grandmother; Hypertension in her maternal grandmother; Kidney cancer in her brother; Lung cancer in her brother; Other in her brother; Ovarian cancer in her mother; Prostate cancer in her father; Stroke in her maternal grandmother; Uterine cancer in her mother.She reports that she quit smoking about 49 years ago. She quit after 2.00 years of use. She has never used smokeless tobacco. She reports that she does not drink alcohol or use drugs.  Current Outpatient Medications on File Prior to Visit  Medication Sig Dispense Refill  . benzonatate (TESSALON) 100 MG capsule TAKE 1 CAPSULE BY MOUTH 3 TIMES A DAY AS NEEDED FOR COUGH 90 capsule 1  . Biotin (RA BIOTIN) 1000 MCG tablet Take 1,000 mcg by mouth 2 (two) times daily.     . clopidogrel (PLAVIX) 75 MG tablet TAKE 1 TABLET BY MOUTH EVERY DAY 30 tablet 5  . dicyclomine (BENTYL) 20 MG tablet TAKE 1 TABLET BY MOUTH FOUR TIMES A DAY AS NEEDED 360 tablet 1  . furosemide (LASIX) 40 MG tablet Take 1 tablet (40 mg total) by mouth 2 (two) times daily. 180 tablet 1  . KLOR-CON M20 20 MEQ tablet Take 20 mEq by mouth 2 (two) times daily. Will take 3 a daily only as needed.    . metoprolol succinate (TOPROL XL) 25 MG 24 hr tablet Take 1 tablet (25 mg total) by mouth daily. 90 tablet 1  . Multiple Vitamin (MULTIVITAMIN) tablet Take 1 tablet by mouth every other  day. Gluten free/Vegetarian Multivitamin    . Nintedanib (OFEV) 150 MG CAPS Take 1 capsule (150 mg total) by mouth 2 (two) times daily. 60 capsule 5  . SYNTHROID 112 MCG tablet TAKE 1 TABLET BY MOUTH EVERY DAY BEFORE BREAKFAST 90 tablet 1  . vitamin C (ASCORBIC ACID) 500 MG tablet Take 1,000 mg by mouth 2 (two) times daily.     . vitamin E 1000 UNIT capsule Take 1,000 Units by mouth daily.     No  current facility-administered medications on file prior to visit.      Objective:  Objective  Physical Exam Vitals signs and nursing note reviewed.  Constitutional:      Appearance: She is well-developed.  HENT:     Head: Normocephalic and atraumatic.     Nose:     Right Sinus: Maxillary sinus tenderness and frontal sinus tenderness present.     Left Sinus: Maxillary sinus tenderness and frontal sinus tenderness present.  Eyes:     Conjunctiva/sclera: Conjunctivae normal.  Neck:     Musculoskeletal: Normal range of motion and neck supple.     Thyroid: No thyromegaly.     Vascular: No carotid bruit or JVD.  Cardiovascular:     Rate and Rhythm: Normal rate and regular rhythm.     Heart sounds: Normal heart sounds. No murmur.  Pulmonary:     Effort: Pulmonary effort is normal. No respiratory distress.     Breath sounds: Normal breath sounds. No wheezing or rales.  Chest:     Chest wall: No tenderness.  Neurological:     Mental Status: She is alert and oriented to person, place, and time.    BP 100/66 (BP Location: Right Arm, Patient Position: Sitting, Cuff Size: Normal)   Pulse 76   Temp (!) 97.3 F (36.3 C) (Temporal)   Resp 18   Ht 5\' 4"  (1.626 m)   Wt (!) 1330 lb (603.3 kg)   SpO2 96%   BMI 228.29 kg/m  Wt Readings from Last 3 Encounters:  01/27/19 134 lb 6.4 oz (61 kg)  01/26/19 (!) 1330 lb (603.3 kg)  12/20/18 137 lb 3.2 oz (62.2 kg)     Lab Results  Component Value Date   WBC 6.3 02/07/2019   HGB 13.4 02/07/2019   HCT 39.8 02/07/2019   PLT 229.0 02/07/2019   GLUCOSE 83 02/07/2019   CHOL 199 02/07/2019   TRIG 73.0 02/07/2019   HDL 63.90 02/07/2019   LDLCALC 120 (H) 02/07/2019   ALT 22 02/07/2019   AST 24 02/07/2019   NA 139 02/07/2019   K 3.3 (L) 02/07/2019   CL 99 02/07/2019   CREATININE 0.72 02/07/2019   BUN 10 02/07/2019   CO2 34 (H) 02/07/2019   TSH 1.13 02/07/2019   INR 1.1 (H) 05/07/2015    No results found.   Assessment & Plan:  Plan   I have discontinued Alexandria [name]. Alexandria [name] amoxicillin-clavulanate. I am also having her start on amoxicillin-clavulanate. Additionally, I am having her maintain her multivitamin, Biotin, vitamin C, vitamin E, [place]-Con M20, dicyclomine, benzonatate, Synthroid, metoprolol succinate, clopidogrel, Ofev, and furosemide.  Meds ordered this encounter  Medications  . amoxicillin-clavulanate (AUGMENTIN) 875-125 MG tablet    Sig: Take 1 tablet by mouth 2 (two) times daily.    Dispense:  20 tablet    Refill:  0    Problem List Items Addressed This Visit      Unprioritized   Essential hypertension - Primary    Well controlled,  no changes to meds. Encouraged heart healthy diet such as the DASH diet and exercise as tolerated.       Relevant Orders   Lipid panel (Completed)   CBC with Differential/Platelet (Completed)   Comprehensive metabolic panel (Completed)   Hypothyroidism    Check labs  con't meds      Relevant Orders   TSH (Completed)   IPF (idiopathic pulmonary fibrosis) ([place])    Per pulmonary      Relevant Medications   amoxicillin-clavulanate (AUGMENTIN) 875-125 MG tablet   Pansinusitis    abx per orders covid test  Call if  Symptoms do not improve      Relevant Medications   amoxicillin-clavulanate (AUGMENTIN) 875-125 MG tablet      Follow-up: Return if symptoms worsen or fail to improve.  [name], DO

## 2019-01-26 NOTE — Patient Instructions (Signed)
Sinusitis, Adult Sinusitis is inflammation of your sinuses. Sinuses are hollow spaces in the bones around your face. Your sinuses are located:  Around your eyes.  In the middle of your forehead.  Behind your nose.  In your cheekbones. Mucus normally drains out of your sinuses. When your nasal tissues become inflamed or swollen, mucus can become trapped or blocked. This allows bacteria, viruses, and fungi to grow, which leads to infection. Most infections of the sinuses are caused by a virus. Sinusitis can develop quickly. It can last for up to 4 weeks (acute) or for more than 12 weeks (chronic). Sinusitis often develops after a cold. What are the causes? This condition is caused by anything that creates swelling in the sinuses or stops mucus from draining. This includes:  Allergies.  Asthma.  Infection from bacteria or viruses.  Deformities or blockages in your nose or sinuses.  Abnormal growths in the nose (nasal polyps).  Pollutants, such as chemicals or irritants in the air.  Infection from fungi (rare). What increases the risk? You are more likely to develop this condition if you:  Have a weak body defense system (immune system).  Do a lot of swimming or diving.  Overuse nasal sprays.  Smoke. What are the signs or symptoms? The main symptoms of this condition are pain and a feeling of pressure around the affected sinuses. Other symptoms include:  Stuffy nose or congestion.  Thick drainage from your nose.  Swelling and warmth over the affected sinuses.  Headache.  Upper toothache.  A cough that may get worse at night.  Extra mucus that collects in the throat or the back of the nose (postnasal drip).  Decreased sense of smell and taste.  Fatigue.  A fever.  Sore throat.  Bad breath. How is this diagnosed? This condition is diagnosed based on:  Your symptoms.  Your medical history.  A physical exam.  Tests to find out if your condition is  acute or chronic. This may include: ? Checking your nose for nasal polyps. ? Viewing your sinuses using a device that has a light (endoscope). ? Testing for allergies or bacteria. ? Imaging tests, such as an MRI or CT scan. In rare cases, a bone biopsy may be done to rule out more serious types of fungal sinus disease. How is this treated? Treatment for sinusitis depends on the cause and whether your condition is chronic or acute.  If caused by a virus, your symptoms should go away on their own within 10 days. You may be given medicines to relieve symptoms. They include: ? Medicines that shrink swollen nasal passages (topical intranasal decongestants). ? Medicines that treat allergies (antihistamines). ? A spray that eases inflammation of the nostrils (topical intranasal corticosteroids). ? Rinses that help get rid of thick mucus in your nose (nasal saline washes).  If caused by bacteria, your health care provider may recommend waiting to see if your symptoms improve. Most bacterial infections will get better without antibiotic medicine. You may be given antibiotics if you have: ? A severe infection. ? A weak immune system.  If caused by narrow nasal passages or nasal polyps, you may need to have surgery. Follow these instructions at home: Medicines  Take, use, or apply over-the-counter and prescription medicines only as told by your health care provider. These may include nasal sprays.  If you were prescribed an antibiotic medicine, take it as told by your health care provider. Do not stop taking the antibiotic even if you start   to feel better. Hydrate and humidify   Drink enough fluid to keep your urine pale yellow. Staying hydrated will help to thin your mucus.  Use a cool mist humidifier to keep the humidity level in your home above 50%.  Inhale steam for 10-15 minutes, 3-4 times a day, or as told by your health care provider. You can do this in the bathroom while a hot shower is  running.  Limit your exposure to cool or dry air. Rest  Rest as much as possible.  Sleep with your head raised (elevated).  Make sure you get enough sleep each night. General instructions   Apply a warm, moist washcloth to your face 3-4 times a day or as told by your health care provider. This will help with discomfort.  Wash your hands often with soap and water to reduce your exposure to germs. If soap and water are not available, use hand sanitizer.  Do not smoke. Avoid being around people who are smoking (secondhand smoke).  Keep all follow-up visits as told by your health care provider. This is important. Contact a health care provider if:  You have a fever.  Your symptoms get worse.  Your symptoms do not improve within 10 days. Get help right away if:  You have a severe headache.  You have persistent vomiting.  You have severe pain or swelling around your face or eyes.  You have vision problems.  You develop confusion.  Your neck is stiff.  You have trouble breathing. Summary  Sinusitis is soreness and inflammation of your sinuses. Sinuses are hollow spaces in the bones around your face.  This condition is caused by nasal tissues that become inflamed or swollen. The swelling traps or blocks the flow of mucus. This allows bacteria, viruses, and fungi to grow, which leads to infection.  If you were prescribed an antibiotic medicine, take it as told by your health care provider. Do not stop taking the antibiotic even if you start to feel better.  Keep all follow-up visits as told by your health care provider. This is important. This information is not intended to replace advice given to you by your health care provider. Make sure you discuss any questions you have with your health care provider. Document Released: 03/23/2005 Document Revised: 08/23/2017 Document Reviewed: 08/23/2017 [employer] Patient Education  2020 [employer]  

## 2019-01-27 ENCOUNTER — Encounter: Payer: Self-pay | Admitting: Cardiovascular Disease

## 2019-01-27 ENCOUNTER — Telehealth: Payer: Self-pay | Admitting: Internal Medicine

## 2019-01-27 ENCOUNTER — Other Ambulatory Visit: Payer: Self-pay

## 2019-01-27 ENCOUNTER — Ambulatory Visit (INDEPENDENT_AMBULATORY_CARE_PROVIDER_SITE_OTHER): Payer: Medicare Other | Admitting: Cardiovascular Disease

## 2019-01-27 VITALS — BP 160/85 | HR 63 | Ht 64.0 in | Wt 134.4 lb

## 2019-01-27 DIAGNOSIS — R002 Palpitations: Secondary | ICD-10-CM

## 2019-01-27 DIAGNOSIS — I872 Venous insufficiency (chronic) (peripheral): Secondary | ICD-10-CM

## 2019-01-27 DIAGNOSIS — I201 Angina pectoris with documented spasm: Secondary | ICD-10-CM | POA: Diagnosis not present

## 2019-01-27 DIAGNOSIS — Z8673 Personal history of transient ischemic attack (TIA), and cerebral infarction without residual deficits: Secondary | ICD-10-CM

## 2019-01-27 DIAGNOSIS — I4510 Unspecified right bundle-branch block: Secondary | ICD-10-CM | POA: Diagnosis not present

## 2019-01-27 DIAGNOSIS — J84112 Idiopathic pulmonary fibrosis: Secondary | ICD-10-CM | POA: Diagnosis not present

## 2019-01-27 DIAGNOSIS — I471 Supraventricular tachycardia: Secondary | ICD-10-CM

## 2019-01-27 DIAGNOSIS — I5189 Other ill-defined heart diseases: Secondary | ICD-10-CM

## 2019-01-27 DIAGNOSIS — J8410 Pulmonary fibrosis, unspecified: Secondary | ICD-10-CM

## 2019-01-27 DIAGNOSIS — Z20822 Contact with and (suspected) exposure to covid-19: Secondary | ICD-10-CM

## 2019-01-27 NOTE — Progress Notes (Signed)
Cardiology Office Note    Date:  01/29/2019   ID:  Alexandria [name], Alexandria [name] 08/20/1945, MRN [id number]  PCP:  Carollee Herter, Alferd Apa, DO  Cardiologist:  Shelva Majestic, MD    History of Present Illness:  Alexandria [name] is a 73 y.o. female who presents for 92-monthfollow-up cardiology evaluation.  Mr. GAron Babaunderwent initial cardiac catheterization in 2004. 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 She had done well on medical therapy. She has a remote history of TIA for which she has been on aspirin and Plavix. In 2015, she was complaining of increasing shortness of breath. A nuclear studies was intermediate risk and suggested possible septal and anterolateral ischemia. Repeat cardiac catheterization was done on 10/25/2013 which again showed normal coronary arteries. Her LV function was now normal with resolution of her previous apical wall motion abnormality. Ejection fraction was 60-65%.  She had been doing fairly well. She was seen by Alexandria Ransomwith atypical chest pain. She also admitted to some chronic edema secondary to venous insufficiency. She complained of nonexertional shortness of breath. She underwent an echo Doppler study 01/27/2015 which showed an EF of 55-60% with normal wall motion. There was grade 1 diastolic dysfunction, mild MR and mild AR.   I last saw her in the office in March 2017 and at that time complained of mild palpitations.  She also has a history of hypothyroidism. She tries to walk every day. There is a history of diverticular disease.  She has had  issue with chronic lower extremity edema secondary to venous insufficiency. An echocardiogram obtained on 06/15/2017 showed EF 55 to 60%, grade 1 DD, mild mitral regurgitation, mild AI. She was diagnosed with idiopathic pulmonary  fibrosis in 2019 however despite this diagnosis, she remains quite active walking several miles on a daily basis and continue to singat local gathering. She was seen by her PCP, and it was noted that her heart rate was in the 40s. EKG demonstrated sinus rhythm, right bundle branch block which is chronic, and quadrigeminy. Heart rate based on EKG was in the 70s. Patient complaining of some fatigue. She was prescribed a course of Augmentin. TSH was normal.  She underwent a telemedicine visit with Alexandria [name] 08/18/2018.She says her fatigue has been going on for a long time and is nothing new. She denies any recent exertional chest pain or shortness of breath despite her active lifestyle. She is on Ofevfor iopathic pulmonary fibrosis,online prescribing information has been checked for this medication, it does have a fairly low chance of causing coronary artery disease however since patient does not have any obvious symptom, I would not proceed with any ischemic work-up at this time. This is not a medication that will increase QT interval based on manufacturer's prescribing information. I suspect that some of her recent diagnosis of bradycardia is due to the machineunable to pick up PVCs. A 24-hour Holter monitor was recommended.   On the Holter monitor study she was monitored for 22 hours and the predominant rhythm was sinus rhythm with an average heart rate at 68 bpm. The slowest heart rate was sinus bradycardia at 46 bpm. There were 11 episodes of SVT with the fastest interval lasting 12.2 seconds with a maximum rate at 235 bpm (average 204 bpm.) There were rare PACs. There were occasional isolated PVCs representing  2.7%. There were no episodes of ventricular couplets or triplets.   I saw her for a telemedicine evaluation on September 07, 2018 after not having seen her since 56.  At that time she denied any chest pain, PND orthopnea but was still having occasional short-lived episodes of  palpitations.  She was on levothyroxine 112 mcg for hypothyroidism and was taking furosemide with supplemental potassium.  She has had extensive evaluation for her idiopathic pulmonary fibrosis and saw Dr. Alma Downs at the [facility] and also has been seen by Dr. Irven Easterly at [facility].  She also has switched local care from Dr. Lake Bells to Dr. Chase Caller.  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  She is scheduled to undergo follow-up CT imaging in December.  She has been utilizing some support stockings for lower extremity edema.  She presents for cardiology follow-up evaluation.  Past Medical History:  Diagnosis Date   Arthritis    Asthmatic bronchitis with acute exacerbation 02/06/2015   Bronchial pneumonia    Colon polyp    Diverticulitis    DVT (deep venous thrombosis) (HCC)    lower extremity   H/O cardiac catheterization 2004   Normal coronary arteries   Heart murmur    History of stress test 05/21/2011   Hx of echocardiogram 01/27/2010   Normal Ef 55% the transmitral spectral doppler flow pattern is normal for age. the left ventricular wall motion is normal   Migraines    Pancreatitis    Thyroid disease    TIA (transient ischemic attack)    8 years ago    Past Surgical History:  Procedure Laterality Date   BREAST BIOPSY     [place]   CARDIAC CATHETERIZATION     10/2013   CATARACT EXTRACTION Right 03/22/2018   LEFT HEART CATHETERIZATION WITH CORONARY ANGIOGRAM N/A 10/25/2013   Procedure: LEFT HEART CATHETERIZATION WITH CORONARY ANGIOGRAM;  Surgeon: Troy Sine, MD;  Location: [facility] CATH LAB;  Service: Cardiovascular;  Laterality: N/A;   NECK SURGERY     x's 2   TOTAL KNEE ARTHROPLASTY     Bilateral x's 2   VAGINAL HYSTERECTOMY  10/17/1998   Alexandria [name]   VIDEO BRONCHOSCOPY Bilateral 01/24/2018   Procedure: VIDEO BRONCHOSCOPY WITH FLUORO;   Surgeon: [name], MD;  Location: [facility];  Service: Cardiopulmonary;  Laterality: Bilateral;    Current Medications: Outpatient Medications Prior to Visit  Medication Sig Dispense Refill   amoxicillin-clavulanate (AUGMENTIN) 875-125 MG tablet Take 1 tablet by mouth 2 (two) times daily. 20 tablet 0   benzonatate (TESSALON) 100 MG capsule TAKE 1 CAPSULE BY MOUTH 3 TIMES A DAY AS NEEDED FOR COUGH 90 capsule 1   Biotin (RA BIOTIN) 1000 MCG tablet Take 1,000 mcg by mouth 2 (two) times daily.      clopidogrel (PLAVIX) 75 MG tablet TAKE 1 TABLET BY MOUTH EVERY DAY 30 tablet 5   dicyclomine (BENTYL) 20 MG tablet TAKE 1 TABLET BY MOUTH FOUR TIMES A DAY AS NEEDED 360 tablet 1   furosemide (LASIX) 40 MG tablet Take 1 tablet (40 mg total) by mouth 2 (two) times daily. 180 tablet 1   KLOR-CON M20 20 MEQ tablet Take 20 mEq by mouth 2 (two) times daily. Will take 3 a daily only as needed.     metoprolol succinate (TOPROL XL) 25 MG 24 hr tablet Take  1 tablet (25 mg total) by mouth daily. 90 tablet 1   Multiple Vitamin (MULTIVITAMIN) tablet Take 1 tablet by mouth every other day. Gluten free/Vegetarian Multivitamin     Nintedanib (OFEV) 150 MG CAPS Take 1 capsule (150 mg total) by mouth 2 (two) times daily. 60 capsule 5   SYNTHROID 112 MCG tablet TAKE 1 TABLET BY MOUTH EVERY DAY BEFORE BREAKFAST 90 tablet 1   vitamin C (ASCORBIC ACID) 500 MG tablet Take 1,000 mg by mouth 2 (two) times daily.      vitamin E 1000 UNIT capsule Take 1,000 Units by mouth daily.     No facility-administered medications prior to visit.      Allergies:   Prednisone, Atrovent hfa [ipratropium bromide hfa], Cheratussin ac [guaifenesin-codeine], Codeine, Hydrocodone, Influenza vaccines, Motrin [ibuprofen], and Oxycodone-acetaminophen   Social History   Socioeconomic History   Marital status: Married    Spouse name: Alexandria [name]   Number of children: 3   Years of education: Jr college   Highest education  level: Not on file  Occupational History   Occupation: Tour manager: UNEMPLOYED  Social Designer, fashion/clothing strain: Not on file   Food insecurity    Worry: Not on file    Inability: Not on file   Transportation needs    Medical: Not on file    Non-medical: Not on file  Tobacco Use   Smoking status: Former Smoker    Years: 2.00    Quit date: 04/06/1969    Years since quitting: 49.8   Smokeless tobacco: Never Used   Tobacco comment: socially smoked x 2 years  Substance and Sexual Activity   Alcohol use: No    Alcohol/week: 0.0 standard drinks   Drug use: No   Sexual activity: Not on file  Lifestyle   Physical activity    Days per week: Not on file    Minutes per session: Not on file   Stress: Not on file  Relationships   Social connections    Talks on phone: Not on file    Gets together: Not on file    Attends religious service: Not on file    Active member of club or organization: Not on file    Attends meetings of clubs or organizations: Not on file    Relationship status: Not on file  Other Topics Concern   Not on file  Social History Narrative   Lives with husband Alexandria [name]   Caffeine use: coffee (2 cups per day)   Mostly right-handed     Family History:  The patient's family history includes Arthritis in an other family member; Colon cancer in her brother and father; Diabetes in her maternal grandmother; HIV in her brother; Heart disease in her maternal grandmother; Hypertension in her maternal grandmother; Kidney cancer in her brother; Lung cancer in her brother; Other in her brother; Ovarian cancer in her mother; Prostate cancer in her father; Stroke in her maternal grandmother; Uterine cancer in her mother.   ROS General: Negative; No fevers, chills, or night sweats;  HEENT: Negative; No changes in vision or hearing, sinus congestion, difficulty swallowing Pulmonary: Idiopathic pulmonary fibrosis Cardiovascular: Negative; No chest pain,  presyncope, syncope, palpitations GI: Negative; No nausea, vomiting, diarrhea, or abdominal pain GU: Negative; No dysuria, hematuria, or difficulty voiding Musculoskeletal: Negative; no myalgias, joint pain, or weakness Hematologic/Oncology: Negative; no easy bruising, bleeding Endocrine: Negative; no heat/cold intolerance; no diabetes Neuro: Negative; no changes in balance, headaches Skin: Negative; No  rashes or skin lesions Psychiatric: Negative; No behavioral problems, depression Sleep: Negative; No snoring, daytime sleepiness, hypersomnolence, bruxism, restless legs, hypnogognic hallucinations, no cataplexy Other comprehensive 14 point system review is negative.   PHYSICAL EXAM:   VS:  BP (!) 160/85    Pulse 63    Ht '5\' 4"'$  (1.626 m)    Wt 134 lb 6.4 oz (61 kg)    SpO2 95%    BMI 23.07 kg/m     Repeat blood pressure by me was 140/80 supine and 122/78 standing  Wt Readings from Last 3 Encounters:  01/27/19 134 lb 6.4 oz (61 kg)  01/26/19 (!) 1330 lb (603.3 kg)  12/20/18 137 lb 3.2 oz (62.2 kg)    General: Alert, oriented, no distress.  Skin: normal turgor, no rashes, warm and dry HEENT: Normocephalic, atraumatic. Pupils equal round and reactive to light; sclera anicteric; extraocular muscles intact;  Nose without nasal septal hypertrophy Mouth/Parynx benign; Mallinpatti scale 2 Neck: No JVD, no carotid bruits; normal carotid upstroke Lungs: No wheezing, mildly coarse Chest wall: without tenderness to palpitation Heart: PMI not displaced, RRR, s1 s2 normal, 1/6 systolic murmur, no diastolic murmur, no rubs, gallops, thrills, or heaves Abdomen: soft, nontender; no hepatosplenomehaly, BS+; abdominal aorta nontender and not dilated by palpation. Back: no CVA tenderness Pulses 2+ Musculoskeletal: full range of motion, normal strength, no joint deformities Extremities: Mild left leg swelling with varicosities;no clubbing cyanosis or edema, Homan's sign negative  Neurologic: grossly  nonfocal; Cranial nerves grossly wnl Psychologic: Normal mood and affect  Studies/Labs Reviewed:   EKG:  EKG  ordered today.  ECG (independently read by me): NSR at 63; RBBB  Recent Labs: BMP Latest Ref Rng & Units 11/10/2018 07/22/2018 05/17/2018  Glucose 70 - 99 mg/dL 98 87 95  BUN 6 - 23 mg/dL '13 14 12  '$ Creatinine 0.40 - 1.20 mg/dL 0.82 0.79 0.64  Sodium 135 - 145 mEq/L 140 141 141  Potassium 3.5 - 5.1 mEq/L 3.2(L) 3.3(L) 3.6  Chloride 96 - 112 mEq/L 99 99 103  CO2 19 - 32 mEq/L 33(H) 31 30  Calcium 8.4 - 10.5 mg/dL 9.4 9.3 9.0     Hepatic Function Latest Ref Rng & Units 01/18/2019 11/10/2018 11/10/2018  Total Protein 6.0 - 8.3 g/dL 6.8 7.3 7.3  Albumin 3.5 - 5.2 g/dL 4.0 4.4 4.4  AST 0 - 37 U/L '21 19 19  '$ ALT 0 - 35 U/L '19 16 16  '$ Alk Phosphatase 39 - 117 U/L 68 73 73  Total Bilirubin 0.2 - 1.2 mg/dL 0.6 0.6 0.6  Bilirubin, Direct 0.0 - 0.3 mg/dL 0.1 - 0.1    CBC Latest Ref Rng & Units 07/22/2018 01/06/2018 08/24/2017  WBC 4.0 - 10.5 K/uL 5.7 7.1 4.7  Hemoglobin 12.0 - 15.0 g/dL 15.1(H) 14.0 13.6  Hematocrit 36.0 - 46.0 % 43.9 40.1 39.2  Platelets 150.0 - 400.0 K/uL 212.0 238 255.0   Lab Results  Component Value Date   MCV 98.0 07/22/2018   MCV 94.1 01/06/2018   MCV 95.1 08/24/2017   Lab Results  Component Value Date   TSH 1.35 01/18/2019   No results found for: HGBA1C   BNP    Component Value Date/Time   BNP 270.1 (H) 01/07/2018 0237    ProBNP No results found for: PROBNP   Lipid Panel     Component Value Date/Time   CHOL 184 05/17/2018 0803   TRIG 68.0 05/17/2018 0803   HDL 55.30 05/17/2018 0803   CHOLHDL 3 05/17/2018 0803  VLDL 13.6 05/17/2018 0803   LDLCALC 115 (H) 05/17/2018 0803     RADIOLOGY: No results found.   Additional studies/ records that were reviewed today include:   ECHO 06/15/2017 Study Conclusions  - Left ventricle: The cavity size was normal. Wall thickness was   normal. Systolic function was normal. The estimated ejection    fraction was in the range of 55% to 60%. Wall motion was normal;   there were no regional wall motion abnormalities. Doppler   parameters are consistent with abnormal left ventricular   relaxation (grade 1 diastolic dysfunction). Doppler parameters   are consistent with high ventricular filling pressure. - Aortic valve: There was mild regurgitation. - Mitral valve: Calcified annulus. There was mild regurgitation.  Impressions:  - Normal LV systolic function; mild diastolic dysfunction; elevated   LV filling pressure; mild AI; mild MR.   Holter Monitor Study Highlights: 08/25/2018  The patient was monitored for 22 hours.  The predominant rhythm was sinus rhythm with an average heart rate at 68 bpm.  The slowest heart rate was sinus bradycardia at 46 bpm.  There were 11 episodes of SVT with the fastest interval lasting 12.2 seconds with a maximum rate at 235 bpm (average 204 bpm.)  There were rare PACs.  There were occasional isolated PVCs representing 2.7%.  There were no episodes of ventricular couplets or triplets.    ASSESSMENT:    1. Pulmonary fibrosis ([place])   2. SVT (supraventricular tachycardia) ([place])   3. PSVT (paroxysmal supraventricular tachycardia) (HCC)   4. Palpitations   5. IPF (idiopathic pulmonary fibrosis) ([place])   6. Diastolic dysfunction   7. H/O TIA (transient ischemic attack) and stroke   8. RBBB   9. Chronic venous insufficiency     PLAN:  Ms. Aron [name] is a 73 year old female who has a questionable history of remote apical MI secondary to vasospasm noted on initial cardiac catheterization with subsequent improvement and normalization on repeat catheterization in 2015.  She had normal coronary arteries.  On echo Doppler evaluation she had normal systolic function with grade 1 diastolic dysfunction.  Her last echo Doppler study was in March 2019.  She has developed idiopathic pulmonary fibrosis and has had disease stabilization on Ofev.  She has been evaluated at  the [facility] by Dr. Alma Downs and is now also seeing Dr. Rochele Pages at [facility] and Dr. Chase Caller.  She continues to exercise and walks 2 to 4 miles every other day.  She has had issues with palpitations and I reviewed her prior Holter monitor study.  Presently, her palpitations are fairly controlled with metoprolol succinate 25 mg daily.  She has issues with chronic lower extremity edema and has been on furosemide 40 mg daily with supplemental potassium and uses support stockings.  She is on Plavix 75 mg daily and had been started on this following a TIA.Marland Kitchen  She has a history of hypothyroidism on levothyroxine replacement.  Presently she is without anginal symptomatology.  Her ECG is stable and shows sinus rhythm without ectopy with her previously noted right bundle branch block.  She will be undergoing follow-up CT imaging next year.  In March 2021 I am recommending she undergo a 2-year follow-up echo Doppler assessment and I will see her in the office for follow-up evaluation.   Medication Adjustments/Labs and Tests Ordered: Current medicines are reviewed at length with the patient today.  Concerns regarding medicines are outlined above.  Medication changes, Labs and Tests ordered today are listed in the Patient Instructions  below. Patient Instructions  Medication Instructions:  The current medical regimen is effective;  continue present plan and medications as directed. Please refer to the Current Medication list given to you today. If you need a refill on your cardiac medications before your next appointment, please call your pharmacy.  Testing/Procedures: Echocardiogram-IN 6 MONTHS - Your physician has requested that you have an echocardiogram. Echocardiography is a painless test that uses sound waves to create images of your heart. It provides your doctor with information about the size and shape of your heart and how well your hearts chambers and valves are working. This procedure takes approximately one  hour. There are no restrictions for this procedure. This will be performed at our [facility] location - [address].  Follow-Up: IN 6 months-AFTER ECHO Please call our office 2 months in advance, JAN 2021 to schedule this APR 2020 appointment. In Person You may see Shelva Majestic, MD or one of the following Advanced Practice Providers on your designated Care Team:  Rosaria Ferries, PA-C Jory Sims, DNP, ANP Cadence Kathlen Mody, NP.    At [facility], you and your health needs are our priority.  As part of our continuing mission to provide you with exceptional heart care, we have created designated Provider Care Teams.  These Care Teams include your primary Cardiologist (physician) and Advanced Practice Providers (APPs -  Physician Assistants and Nurse Practitioners) who all work together to provide you with the care you need, when you need it.  Thank you for choosing [facility] at [facility]!!          Signed, Shelva Majestic, MD  01/29/2019 10:30 AM    [facility] [address], [place], [place], [place]  [address] Phone: [phone]

## 2019-01-27 NOTE — Telephone Encounter (Signed)
CT order is for December.  Called pt & told her we normally call & schedule these the month prior.  She wanted to verify it should be in December.  I told her per AVS MR said in Sept to do in 3 months.  Pt states ok to call next month to schedule for Dec.  Nothing further needed.

## 2019-01-27 NOTE — Patient Instructions (Signed)
Medication Instructions:  The current medical regimen is effective;  continue present plan and medications as directed. Please refer to the Current Medication list given to you today. If you need a refill on your cardiac medications before your next appointment, please call your pharmacy.  Testing/Procedures: Echocardiogram-IN 6 MONTHS - Your physician has requested that you have an echocardiogram. Echocardiography is a painless test that uses sound waves to create images of your heart. It provides your doctor with information about the size and shape of your heart and how well your heart's chambers and valves are working. This procedure takes approximately one hour. There are no restrictions for this procedure. This will be performed at our [facility] location - [address].  Follow-Up: IN 6 months-AFTER ECHO Please call our office 2 months in advance, JAN 2021 to schedule this APR 2020 appointment. In Person You may see Shelva Majestic, MD or one of the following Advanced Practice Providers on your designated Care Team:  Rosaria Ferries, PA-C Jory Sims, DNP, ANP Cadence Kathlen Mody, NP.    At [facility], you and your health needs are our priority.  As part of our continuing mission to provide you with exceptional heart care, we have created designated Provider Care Teams.  These Care Teams include your primary Cardiologist (physician) and Advanced Practice Providers (APPs -  Physician Assistants and Nurse Practitioners) who all work together to provide you with the care you need, when you need it.  Thank you for choosing [facility] at [facility]!!

## 2019-01-28 LAB — NOVEL CORONAVIRUS, NAA: SARS-CoV-2, NAA: NOT DETECTED

## 2019-01-29 ENCOUNTER — Encounter: Payer: Self-pay | Admitting: Cardiovascular Disease

## 2019-01-30 DIAGNOSIS — J84112 Idiopathic pulmonary fibrosis: Secondary | ICD-10-CM | POA: Diagnosis not present

## 2019-01-30 DIAGNOSIS — M6281 Muscle weakness (generalized): Secondary | ICD-10-CM | POA: Diagnosis not present

## 2019-02-01 ENCOUNTER — Telehealth: Payer: Self-pay

## 2019-02-01 NOTE — Telephone Encounter (Signed)
Called patient to give COVID results. Pt was upset with her treatment in the office. Pt states being accused of being a liar and she upset about being made to go out the back door. Pt states she told the nurse on the phone about her symptoms and was told to come in.  She states she has never been made to feel the way she felt when she was in the office. She states she already knew she didn't have COVID. Pt states she has seen Dr. Etter Sjogren for a long time and was upset with how she was treated.

## 2019-02-01 NOTE — Telephone Encounter (Signed)
I never accused her of lying ---   And Alexandria [name] listened to the message--- she asked to go out a back door so she did not have to walk through the waiting room I was not upset with her

## 2019-02-02 NOTE — Telephone Encounter (Signed)
Left message on machine to call back  

## 2019-02-06 ENCOUNTER — Other Ambulatory Visit: Payer: Medicare Other

## 2019-02-07 ENCOUNTER — Other Ambulatory Visit (INDEPENDENT_AMBULATORY_CARE_PROVIDER_SITE_OTHER): Payer: Medicare Other

## 2019-02-07 ENCOUNTER — Other Ambulatory Visit: Payer: Self-pay

## 2019-02-07 DIAGNOSIS — I10 Essential (primary) hypertension: Secondary | ICD-10-CM

## 2019-02-07 DIAGNOSIS — E039 Hypothyroidism, unspecified: Secondary | ICD-10-CM

## 2019-02-07 DIAGNOSIS — J84112 Idiopathic pulmonary fibrosis: Secondary | ICD-10-CM | POA: Diagnosis not present

## 2019-02-07 DIAGNOSIS — M6281 Muscle weakness (generalized): Secondary | ICD-10-CM | POA: Diagnosis not present

## 2019-02-07 LAB — CBC WITH DIFFERENTIAL/PLATELET
Basophils Absolute: 0.1 10*3/uL (ref 0.0–0.1)
Basophils Relative: 1.2 % (ref 0.0–3.0)
Eosinophils Absolute: 0.4 10*3/uL (ref 0.0–0.7)
Eosinophils Relative: 6.9 % — ABNORMAL HIGH (ref 0.0–5.0)
HCT: 39.8 % (ref 36.0–46.0)
Hemoglobin: 13.4 g/dL (ref 12.0–15.0)
Lymphocytes Relative: 36 % (ref 12.0–46.0)
Lymphs Abs: 2.3 10*3/uL (ref 0.7–4.0)
MCHC: 33.7 g/dL (ref 30.0–36.0)
MCV: 98.6 fl (ref 78.0–100.0)
Monocytes Absolute: 0.9 10*3/uL (ref 0.1–1.0)
Monocytes Relative: 13.9 % — ABNORMAL HIGH (ref 3.0–12.0)
Neutro Abs: 2.7 10*3/uL (ref 1.4–7.7)
Neutrophils Relative %: 42 % — ABNORMAL LOW (ref 43.0–77.0)
Platelets: 229 10*3/uL (ref 150.0–400.0)
RBC: 4.04 Mil/uL (ref 3.87–5.11)
RDW: 14.1 % (ref 11.5–15.5)
WBC: 6.3 10*3/uL (ref 4.0–10.5)

## 2019-02-07 LAB — LIPID PANEL
Cholesterol: 199 mg/dL (ref 0–200)
HDL: 63.9 mg/dL (ref >39.00)
LDL Cholesterol: 120 mg/dL — ABNORMAL HIGH (ref 0–99)
NonHDL: 134.63
Total CHOL/HDL Ratio: 3
Triglycerides: 73 mg/dL (ref 0.0–149.0)
VLDL: 14.6 mg/dL (ref 0.0–40.0)

## 2019-02-07 LAB — COMPREHENSIVE METABOLIC PANEL
ALT: 22 U/L (ref 0–35)
AST: 24 U/L (ref 0–37)
Albumin: 4.1 g/dL (ref 3.5–5.2)
Alkaline Phosphatase: 69 U/L (ref 39–117)
BUN: 10 mg/dL (ref 6–23)
CO2: 34 mEq/L — ABNORMAL HIGH (ref 19–32)
Calcium: 9.1 mg/dL (ref 8.4–10.5)
Chloride: 99 mEq/L (ref 96–112)
Creatinine, Ser: 0.72 mg/dL (ref 0.40–1.20)
GFR: 79.15 mL/min (ref >60.00)
Glucose, Bld: 83 mg/dL (ref 70–99)
Potassium: 3.3 mEq/L — ABNORMAL LOW (ref 3.5–5.1)
Sodium: 139 mEq/L (ref 135–145)
Total Bilirubin: 0.4 mg/dL (ref 0.2–1.2)
Total Protein: 6.5 g/dL (ref 6.0–8.3)

## 2019-02-07 LAB — TSH: TSH: 1.13 u[IU]/mL (ref 0.35–4.50)

## 2019-02-08 ENCOUNTER — Telehealth: Payer: Self-pay | Admitting: Internal Medicine

## 2019-02-08 DIAGNOSIS — I10 Essential (primary) hypertension: Secondary | ICD-10-CM | POA: Insufficient documentation

## 2019-02-08 NOTE — Assessment & Plan Note (Signed)
Check labs con't meds 

## 2019-02-08 NOTE — Assessment & Plan Note (Signed)
Per pulmonary 

## 2019-02-08 NOTE — Assessment & Plan Note (Signed)
abx per orders covid test  Call if  Symptoms do not improve

## 2019-02-08 NOTE — Assessment & Plan Note (Signed)
Well controlled, no changes to meds. Encouraged heart healthy diet such as the DASH diet and exercise as tolerated.  °

## 2019-02-08 NOTE — Addendum Note (Signed)
Addended by: Roma Schanz R on: 02/08/2019 09:48 AM   Modules accepted: Level of Service

## 2019-02-08 NOTE — Telephone Encounter (Signed)
I have pt's CT order.  It was placed on 9/15 and per MR to be done in 3 months.  Called pt & left vm for her to call me back.

## 2019-02-08 NOTE — Telephone Encounter (Signed)
Spoke to pt.  Got her CT scheduled for her on 12/1.  Gave appt info to pt.  Nothing further needed.

## 2019-02-13 DIAGNOSIS — J849 Interstitial pulmonary disease, unspecified: Secondary | ICD-10-CM | POA: Diagnosis not present

## 2019-02-13 DIAGNOSIS — R0602 Shortness of breath: Secondary | ICD-10-CM | POA: Diagnosis not present

## 2019-02-13 DIAGNOSIS — Z87891 Personal history of nicotine dependence: Secondary | ICD-10-CM | POA: Diagnosis not present

## 2019-02-15 DIAGNOSIS — M6281 Muscle weakness (generalized): Secondary | ICD-10-CM | POA: Diagnosis not present

## 2019-02-15 DIAGNOSIS — J84112 Idiopathic pulmonary fibrosis: Secondary | ICD-10-CM | POA: Diagnosis not present

## 2019-02-20 DIAGNOSIS — J84112 Idiopathic pulmonary fibrosis: Secondary | ICD-10-CM | POA: Diagnosis not present

## 2019-02-20 DIAGNOSIS — M6281 Muscle weakness (generalized): Secondary | ICD-10-CM | POA: Diagnosis not present

## 2019-02-22 ENCOUNTER — Telehealth: Payer: Self-pay | Admitting: *Deleted

## 2019-02-22 ENCOUNTER — Telehealth: Payer: Self-pay | Admitting: Family Medicine

## 2019-02-22 DIAGNOSIS — E876 Hypokalemia: Secondary | ICD-10-CM

## 2019-02-22 NOTE — Telephone Encounter (Signed)
Dr. Etter Sjogren not sure what happened with her results, can you please review?  Advised patient not sure what happened, but will let her know as soon as you review them.

## 2019-02-22 NOTE — Telephone Encounter (Signed)
error 

## 2019-02-22 NOTE — Telephone Encounter (Signed)
Patient inquiring about 02/07/2019 lab results, please advise

## 2019-02-23 ENCOUNTER — Other Ambulatory Visit: Payer: Self-pay | Admitting: Family Medicine

## 2019-02-23 ENCOUNTER — Telehealth: Payer: Self-pay | Admitting: *Deleted

## 2019-02-23 DIAGNOSIS — J0140 Acute pansinusitis, unspecified: Secondary | ICD-10-CM

## 2019-02-23 DIAGNOSIS — E876 Hypokalemia: Secondary | ICD-10-CM

## 2019-02-23 MED ORDER — PREDNISONE 10 MG PO TABS: ORAL_TABLET | ORAL | 0 refills | Status: DC

## 2019-02-23 NOTE — Telephone Encounter (Signed)
Patient stated that she has been given antibiotics twice so far for her sinus issue.  She is having a headache and sinus pressure.  She would like to know if she can have the steroid injection?  If yes, ok to do nv?

## 2019-02-23 NOTE — Telephone Encounter (Signed)
She will take the prednisone taper.  Can you send in?

## 2019-02-23 NOTE — Telephone Encounter (Signed)
Overall good---- potassium is low --- if she is taking potassium bid---- increase to tid If still low at recheck may need to change lasix  Recheck 3 months

## 2019-02-23 NOTE — Telephone Encounter (Signed)
Patient notified of results.  She will increase potassium to tid.  Her cardiologist advised her to take furosemide 40mg  once a day instead of bid.  Appointment made and future order placed.

## 2019-02-23 NOTE — Telephone Encounter (Signed)
We can not bring her in here sick unless kim agrees there is a safe way to give injection =---  I can send a pred taper to pharrmacy

## 2019-02-27 ENCOUNTER — Telehealth: Payer: Self-pay | Admitting: Internal Medicine

## 2019-02-27 NOTE — Telephone Encounter (Signed)
Called [facility]: Per rep John. Pt needs new PA which is in effect Order will be shipped as soon PA approved [facility] team working on the [place].  Pt notified. Nothing further needed

## 2019-02-28 ENCOUNTER — Telehealth: Payer: Self-pay | Admitting: Internal Medicine

## 2019-02-28 NOTE — Telephone Encounter (Signed)
Hart Carwin from [employer] called to verify fax was received.  Fax is in Dr. Golden Pop box up front.  [phone] option [facility].

## 2019-02-28 NOTE — Telephone Encounter (Signed)
Spoke with pt, she states she received a message from [facility] stated that she wasn't going to get her medication because they need authorization  I called [facility] and they stated they the OFEV needs prior authorization and that they faxed the form to [place] yesterday. I will go to the fax machine and check to see if this came through.

## 2019-03-01 DIAGNOSIS — M6281 Muscle weakness (generalized): Secondary | ICD-10-CM | POA: Diagnosis not present

## 2019-03-01 DIAGNOSIS — J84112 Idiopathic pulmonary fibrosis: Secondary | ICD-10-CM | POA: Diagnosis not present

## 2019-03-01 NOTE — Telephone Encounter (Signed)
Form received and completed and given to Raquel Sarna to have MR sign when back in office.

## 2019-03-06 ENCOUNTER — Telehealth: Payer: Self-pay | Admitting: Internal Medicine

## 2019-03-06 NOTE — Telephone Encounter (Signed)
I called and spoke with the patient and made her aware that the papers are on his desk today to be signed. She states that she will run out on Wednesday.

## 2019-03-06 NOTE — Telephone Encounter (Signed)
Paperwork was given to MR today during clinic 03/06/2019 for him to sign. MR will be in clinic tomorrow 03/07/2019. As soon as paperwork is given back to me by him tomorrow, I will fax all paperwork and then also call the specialty pharmacy letting them know that the paperwork has been faxed and to see if we can put a rush on her getting a shipment of the medication due to her about to run out.  Will update encounter tomorrow 12/1

## 2019-03-07 ENCOUNTER — Ambulatory Visit (HOSPITAL_BASED_OUTPATIENT_CLINIC_OR_DEPARTMENT_OTHER)
Admission: RE | Admit: 2019-03-07 | Discharge: 2019-03-07 | Disposition: A | Discharge status: Home / Self Care | Payer: Medicare Other | Source: Ambulatory Visit | Attending: Internal Medicine | Admitting: Internal Medicine

## 2019-03-07 ENCOUNTER — Other Ambulatory Visit: Payer: Self-pay

## 2019-03-07 DIAGNOSIS — J84112 Idiopathic pulmonary fibrosis: Secondary | ICD-10-CM

## 2019-03-07 DIAGNOSIS — J849 Interstitial pulmonary disease, unspecified: Secondary | ICD-10-CM | POA: Diagnosis not present

## 2019-03-07 NOTE — Telephone Encounter (Signed)
Forms have been faxed to provided fax number on the form.

## 2019-03-09 ENCOUNTER — Telehealth: Payer: Self-pay | Admitting: Internal Medicine

## 2019-03-09 NOTE — Telephone Encounter (Signed)
Pt calling requesting to know the results of CT which was performed 12/1. MR, please advise on this for pt. Thanks!

## 2019-03-10 NOTE — Telephone Encounter (Signed)
Called the patient and advised her of the results. Patient voiced understanding. Nothing further needed at this time. 

## 2019-03-10 NOTE — Telephone Encounter (Signed)
  CT chest high resoltuion - no progression in ILD in 1 years. No cancer  Good news     SIGNATURE    Dr. Brand Males, M.D., F.C.C.P,  Pulmonary and Critical Care Medicine Staff Physician, [place] Director - Interstitial Lung Disease  Program  [facility] [place], [place], [address]  Pager: [phone], If no answer or between  15:00h - 7:00h: call [phone] Telephone: [phone]  2:04 PM 03/10/2019      IMPRESSION: 1. 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 No frank honeycombing. No appreciable interval disease progression. Differential remains fibrotic phase nonspecific interstitial pneumonia (NSIP) or usual interstitial pneumonia (UIP). Findings are categorized as probable UIP per consensus guidelines: Diagnosis of Idiopathic Pulmonary Fibrosis: An Official ATS/ERS/JRS/ALAT Clinical Practice Guideline. [place], Iss 5, [phone], Dec 05 2016. 2. Dilated main pulmonary artery, suggesting pulmonary arterial hypertension. 3. Stable ectatic 4.1 cm ascending thoracic aorta. Recommend annual imaging followup by CTA or MRA. This recommendation follows 2010 ACCF/AHA/AATS/ACR/ASA/SCA/SCAI/SIR/STS/SVM Guidelines for the Diagnosis and Management of Patients with Thoracic Aortic Disease. Circulation. 2010; [id number]. Aortic aneurysm NOS (ICD10-I71.9).  Aortic Atherosclerosis (ICD10-I70.0).   Electronically Signed   By: Ilona Sorrel M.D.   On: 03/07/2019 10:29

## 2019-03-13 ENCOUNTER — Other Ambulatory Visit: Payer: Self-pay | Admitting: *Deleted

## 2019-03-13 DIAGNOSIS — J84112 Idiopathic pulmonary fibrosis: Secondary | ICD-10-CM

## 2019-03-13 NOTE — Telephone Encounter (Signed)
Received fax from [place] stating that pt's prior authorization for the OFEV has been approved from 12/07/2018-03/06/2020. Nothing further needed.

## 2019-03-20 NOTE — Progress Notes (Signed)
This encounter was created in error - please disregard.

## 2019-03-23 ENCOUNTER — Telehealth: Payer: Self-pay | Admitting: Family Medicine

## 2019-03-23 ENCOUNTER — Telehealth: Payer: Self-pay

## 2019-03-23 ENCOUNTER — Other Ambulatory Visit: Payer: Self-pay | Admitting: Family Medicine

## 2019-03-23 DIAGNOSIS — E876 Hypokalemia: Secondary | ICD-10-CM

## 2019-03-23 MED ORDER — KLOR-CON M20 20 MEQ PO TBCR: 20.0000 meq | EXTENDED_RELEASE_TABLET | Freq: Two times a day (BID) | ORAL | 1 refills | Status: DC

## 2019-03-23 NOTE — Telephone Encounter (Signed)
Which medication ?

## 2019-03-23 NOTE — Telephone Encounter (Signed)
Klor-con M20 20MEQ

## 2019-03-23 NOTE — Telephone Encounter (Signed)
Pt needs a new rx for her Klor-Con 20 MEQ.  Dr. Etter Sjogren changed prescription from 2 a day to 3 a day    [facility] [address]  CB#  [phone]

## 2019-03-23 NOTE — Telephone Encounter (Signed)
Last OV 01/26/19 Last fill  06/15/18    Last Sig: Take 20 mEq by mouth 2 (two) times daily. Will take 3 a daily only as needed. [facility] to [facility] for patient.  Please advise.

## 2019-03-24 ENCOUNTER — Other Ambulatory Visit: Payer: Self-pay | Admitting: Cardiovascular Disease

## 2019-03-28 ENCOUNTER — Other Ambulatory Visit: Payer: Self-pay | Admitting: Family Medicine

## 2019-04-07 ENCOUNTER — Other Ambulatory Visit: Payer: Self-pay | Admitting: Family Medicine

## 2019-04-10 ENCOUNTER — Other Ambulatory Visit: Payer: Self-pay

## 2019-04-11 ENCOUNTER — Encounter: Payer: Self-pay | Admitting: Family Medicine

## 2019-04-11 ENCOUNTER — Ambulatory Visit (INDEPENDENT_AMBULATORY_CARE_PROVIDER_SITE_OTHER): Payer: Medicare Other | Admitting: Family Medicine

## 2019-04-11 VITALS — Ht 64.0 in | Wt 135.0 lb

## 2019-04-11 DIAGNOSIS — J0140 Acute pansinusitis, unspecified: Secondary | ICD-10-CM | POA: Diagnosis not present

## 2019-04-11 MED ORDER — AMOXICILLIN-POT CLAVULANATE 875-125 MG PO TABS: 1.0000 | ORAL_TABLET | Freq: Two times a day (BID) | ORAL | 0 refills | Status: DC

## 2019-04-11 NOTE — Progress Notes (Signed)
Virtual Visit via Video Note  I connected with Alexandria [name] on 04/11/19 at 10:00 AM EST by a video enabled telemedicine application and verified that I am speaking with the correct person using two identifiers.  Location: Patient: home alone  Provider: home    I discussed the limitations of evaluation and management by telemedicine and the availability of in person appointments. The patient expressed understanding and agreed to proceed.  History of Present Illness: Pt c/o chills 4-5 days with chills , + pnd   She took aspirin 3 days ago but nothing else  + sinus headache/ congestion  No fever  Some cough-- not productive     Observations/Objective: There were no vitals filed for this visit. Pt is in NAD   Assessment and Plan: 1. Acute non-recurrent pansinusitis abx per orders Previous notes reviewed Use flonase and antihistamine Call if symptoms worsen or do not improve Pt will get covid test  - amoxicillin-clavulanate (AUGMENTIN) 875-125 MG tablet; Take 1 tablet by mouth 2 (two) times daily.  Dispense: 20 tablet; Refill: 0   Follow Up Instructions:    I discussed the assessment and treatment plan with the patient. The patient was provided an opportunity to ask questions and all were answered. The patient agreed with the plan and demonstrated an understanding of the instructions.   The patient was advised to call back or seek an in-person evaluation if the symptoms worsen or if the condition fails to improve as anticipated.     [name], DO

## 2019-04-25 ENCOUNTER — Other Ambulatory Visit: Payer: Self-pay | Admitting: Family Medicine

## 2019-04-25 DIAGNOSIS — J0140 Acute pansinusitis, unspecified: Secondary | ICD-10-CM

## 2019-04-26 DIAGNOSIS — M6281 Muscle weakness (generalized): Secondary | ICD-10-CM | POA: Diagnosis not present

## 2019-04-26 DIAGNOSIS — J84112 Idiopathic pulmonary fibrosis: Secondary | ICD-10-CM | POA: Diagnosis not present

## 2019-04-28 ENCOUNTER — Other Ambulatory Visit: Payer: Self-pay

## 2019-05-01 ENCOUNTER — Encounter: Payer: Self-pay | Admitting: Family Medicine

## 2019-05-01 ENCOUNTER — Other Ambulatory Visit: Payer: Self-pay

## 2019-05-01 ENCOUNTER — Ambulatory Visit (INDEPENDENT_AMBULATORY_CARE_PROVIDER_SITE_OTHER): Payer: Medicare Other | Admitting: Family Medicine

## 2019-05-01 ENCOUNTER — Ambulatory Visit (HOSPITAL_BASED_OUTPATIENT_CLINIC_OR_DEPARTMENT_OTHER)
Admission: RE | Admit: 2019-05-01 | Discharge: 2019-05-01 | Disposition: A | Discharge status: Home / Self Care | Payer: Medicare Other | Source: Ambulatory Visit | Attending: Family Medicine | Admitting: Family Medicine

## 2019-05-01 VITALS — BP 128/84 | HR 90 | Temp 97.5°F | Resp 18 | Ht 64.0 in | Wt 138.4 lb

## 2019-05-01 DIAGNOSIS — J8410 Pulmonary fibrosis, unspecified: Secondary | ICD-10-CM

## 2019-05-01 DIAGNOSIS — M79662 Pain in left lower leg: Secondary | ICD-10-CM

## 2019-05-01 DIAGNOSIS — E876 Hypokalemia: Secondary | ICD-10-CM

## 2019-05-01 DIAGNOSIS — I471 Supraventricular tachycardia: Secondary | ICD-10-CM | POA: Diagnosis not present

## 2019-05-01 DIAGNOSIS — R600 Localized edema: Secondary | ICD-10-CM | POA: Diagnosis not present

## 2019-05-01 LAB — BASIC METABOLIC PANEL
BUN: 14 mg/dL (ref 6–23)
CO2: 31 mEq/L (ref 19–32)
Calcium: 8.8 mg/dL (ref 8.4–10.5)
Chloride: 100 mEq/L (ref 96–112)
Creatinine, Ser: 0.89 mg/dL (ref 0.40–1.20)
GFR: 61.94 mL/min (ref >60.00)
Glucose, Bld: 99 mg/dL (ref 70–99)
Potassium: 3.9 mEq/L (ref 3.5–5.1)
Sodium: 138 mEq/L (ref 135–145)

## 2019-05-01 NOTE — Patient Instructions (Signed)

## 2019-05-01 NOTE — Progress Notes (Signed)
Patient ID: Alexandria [name], female    DOB: 06/30/1945  Age: 74 y.o. MRN: [id number]    Subjective:  Subjective  HPI Alexandria [name] presents for pain and swelling in the L low ext x sever days.  She is taking her lasix daily No sob, no chest pain  Pt is also requesting to be transferred to cardiology in [place] due to location  Review of Systems  Constitutional: Negative for appetite change, diaphoresis, fatigue and unexpected weight change.  Eyes: Negative for pain, redness and visual disturbance.  Respiratory: Negative for cough, chest tightness, shortness of breath and wheezing.   Cardiovascular: Negative for chest pain, palpitations and leg swelling.  Endocrine: Negative for cold intolerance, heat intolerance, polydipsia, polyphagia and polyuria.  Genitourinary: Negative for difficulty urinating, dysuria and frequency.  Musculoskeletal: Positive for joint swelling.  Neurological: Negative for dizziness, light-headedness, numbness and headaches.    History Past Medical History:  Diagnosis Date  . Arthritis   . Asthmatic bronchitis with acute exacerbation 02/06/2015  . Bronchial pneumonia   . Colon polyp   . Diverticulitis   . DVT (deep venous thrombosis) ([place])    lower extremity  . H/O cardiac catheterization 2004   Normal coronary arteries  . Heart murmur   . History of stress test 05/21/2011  . Hx of echocardiogram 01/27/2010   Normal Ef 55% the transmitral spectral doppler flow pattern is normal for age. the left ventricular wall motion is normal  . Migraines   . Pancreatitis   . Thyroid disease   . TIA (transient ischemic attack)    8 years ago    She has a past surgical history that includes Vaginal hysterectomy (10/17/1998); Breast biopsy; Total knee arthroplasty; Neck surgery; Cardiac catheterization; left heart catheterization with coronary angiogram (N/A, 10/25/2013); Video bronchoscopy (Bilateral, 01/24/2018); and Cataract extraction (Right, 03/22/2018).    Her family history includes Arthritis in an other family member; Colon cancer in her brother and father; Diabetes in her maternal grandmother; HIV in her brother; Heart disease in her maternal grandmother; Hypertension in her maternal grandmother; Kidney cancer in her brother; Lung cancer in her brother; Other in her brother; Ovarian cancer in her mother; Prostate cancer in her father; Stroke in her maternal grandmother; Uterine cancer in her mother.She reports that she quit smoking about 50 years ago. She quit after 2.00 years of use. She has never used smokeless tobacco. She reports that she does not drink alcohol or use drugs.  Current Outpatient Medications on File Prior to Visit  Medication Sig Dispense Refill  . amoxicillin-clavulanate (AUGMENTIN) 875-125 MG tablet Take 1 tablet by mouth 2 (two) times daily. 20 tablet 0  . Biotin (RA BIOTIN) 1000 MCG tablet Take 1,000 mcg by mouth 2 (two) times daily.     . clopidogrel (PLAVIX) 75 MG tablet TAKE 1 TABLET BY MOUTH EVERY DAY 30 tablet 5  . dicyclomine (BENTYL) 20 MG tablet TAKE 1 TABLET BY MOUTH FOUR TIMES A DAY AS NEEDED 360 tablet 1  . furosemide (LASIX) 40 MG tablet Take 1 tablet (40 mg total) by mouth 2 (two) times daily. 180 tablet 1  . KLOR-CON M20 20 MEQ tablet Take 1 tablet (20 mEq total) by mouth 2 (two) times daily. Will take 3 a daily only as needed. 180 tablet 1  . metoprolol succinate (TOPROL-XL) 25 MG 24 hr tablet TAKE 1 TABLET BY MOUTH EVERY DAY 90 tablet 1  . Multiple Vitamin (MULTIVITAMIN) tablet Take 1 tablet by mouth every other day.  Gluten free/Vegetarian Multivitamin    . Nintedanib (OFEV) 150 MG CAPS Take 1 capsule (150 mg total) by mouth 2 (two) times daily. 60 capsule 5  . SYNTHROID 112 MCG tablet TAKE 1 TABLET BY MOUTH EVERY DAY BEFORE BREAKFAST 90 tablet 1  . vitamin C (ASCORBIC ACID) 500 MG tablet Take 1,000 mg by mouth 2 (two) times daily.     . vitamin E 1000 UNIT capsule Take 1,000 Units by mouth daily.    .  benzonatate (TESSALON) 100 MG capsule TAKE 1 CAPSULE BY MOUTH 3 TIMES A DAY AS NEEDED FOR COUGH (Patient not taking: Reported on 04/11/2019) 90 capsule 1   No current facility-administered medications on file prior to visit.     Objective:  Objective  Physical Exam Vitals and nursing note reviewed.  Constitutional:      Appearance: She is well-developed.  HENT:     Head: Normocephalic and atraumatic.  Eyes:     Conjunctiva/sclera: Conjunctivae normal.  Neck:     Thyroid: No thyromegaly.     Vascular: No carotid bruit or JVD.  Cardiovascular:     Rate and Rhythm: Normal rate and regular rhythm.     Heart sounds: Normal heart sounds. No murmur.  Pulmonary:     Effort: Pulmonary effort is normal. No respiratory distress.     Breath sounds: Normal breath sounds. No wheezing or rales.  Chest:     Chest wall: No tenderness.  Musculoskeletal:        General: Swelling and tenderness present.     Cervical back: Normal range of motion and neck supple.     Left lower leg: Swelling and tenderness present. 1+ Pitting Edema present.       Legs:  Neurological:     Mental Status: She is alert and oriented to person, place, and time.    BP 128/84 (BP Location: Right Arm, Patient Position: Sitting, Cuff Size: Normal)   Pulse 90   Temp (!) 97.5 F (36.4 C) (Temporal)   Resp 18   Ht 5\' 4"  (1.626 m)   Wt 138 lb 6.4 oz (62.8 kg)   SpO2 97%   BMI 23.76 kg/m  Wt Readings from Last 3 Encounters:  05/01/19 138 lb 6.4 oz (62.8 kg)  04/11/19 135 lb (61.2 kg)  01/27/19 134 lb 6.4 oz (61 kg)     Lab Results  Component Value Date   WBC 6.3 02/07/2019   HGB 13.4 02/07/2019   HCT 39.8 02/07/2019   PLT 229.0 02/07/2019   GLUCOSE 99 05/01/2019   CHOL 199 02/07/2019   TRIG 73.0 02/07/2019   HDL 63.90 02/07/2019   LDLCALC 120 (H) 02/07/2019   ALT 22 02/07/2019   AST 24 02/07/2019   NA 138 05/01/2019   K 3.9 05/01/2019   CL 100 05/01/2019   CREATININE 0.89 05/01/2019   BUN 14 05/01/2019    CO2 31 05/01/2019   TSH 1.13 02/07/2019   INR 1.1 (H) 05/07/2015    CT Chest High Resolution  Result Date: 03/07/2019 CLINICAL DATA:  Follow-up interstitial lung disease. Dyspnea has reportedly improved. EXAM: CT CHEST WITHOUT CONTRAST TECHNIQUE: Multidetector CT imaging of the chest was performed following the standard protocol without intravenous contrast. High resolution imaging of the lungs, as well as inspiratory and expiratory imaging, was performed. COMPARISON:  12/30/2017 high-resolution chest CT. FINDINGS: Cardiovascular: Top-normal heart size. No significant pericardial effusion/thickening. Atherosclerotic thoracic aorta with stable ectatic 4.1 cm ascending thoracic aorta. Dilated main pulmonary artery (3.6 cm diameter).  Mediastinum/Nodes: Thyroid is either atrophic or surgically absent. Small amount of fluid in the mid to lower thoracic esophagus. No pathologically enlarged axillary, mediastinal or hilar lymph nodes, noting limited sensitivity for the detection of hilar adenopathy on this noncontrast study. Lungs/Pleura: No pneumothorax. No pleural effusion. No acute consolidative airspace disease or lung masses. Stable scattered tiny calcified granulomas in both lungs. A few scattered small solid pulmonary nodules in the right lung, largest 4 mm in the peripheral apical right upper lobe (series 3/image 34), all stable, considered benign. No new significant pulmonary nodules. Moderate patchy confluent subpleural reticulation and ground-glass opacity throughout both lungs with associated minimal traction bronchiolectasis and architectural distortion. Slight basilar predominance to these findings. No frank honeycombing. No significant air trapping on the limited expiration sequence. No evidence of tracheobronchomalacia. No appreciable interval progression. Upper abdomen: No acute abnormality. Musculoskeletal: No aggressive appearing focal osseous lesions. Surgical hardware from ACDF in the lower  cervical spine. Moderate to marked thoracic spondylosis. IMPRESSION: 1. 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 No frank honeycombing. No appreciable interval disease progression. Differential remains fibrotic phase nonspecific interstitial pneumonia (NSIP) or usual interstitial pneumonia (UIP). Findings are categorized as probable UIP per consensus guidelines: Diagnosis of Idiopathic Pulmonary Fibrosis: An Official ATS/ERS/JRS/ALAT Clinical Practice Guideline. [place], Iss 5, [phone], Dec 05 2016. 2. Dilated main pulmonary artery, suggesting pulmonary arterial hypertension. 3. Stable ectatic 4.1 cm ascending thoracic aorta. Recommend annual imaging followup by CTA or MRA. This recommendation follows 2010 ACCF/AHA/AATS/ACR/ASA/SCA/SCAI/SIR/STS/SVM Guidelines for the Diagnosis and Management of Patients with Thoracic Aortic Disease. Circulation. 2010; [id number]. Aortic aneurysm NOS (ICD10-I71.9). Aortic Atherosclerosis (ICD10-I70.0). Electronically Signed   By: Ilona Sorrel M.D.   On: 03/07/2019 10:29     Assessment & Plan:  Plan  I am having [name] maintain her multivitamin, Biotin, vitamin C, vitamin E, benzonatate, clopidogrel, Ofev, furosemide, Klor-Con M20, metoprolol succinate, Synthroid, dicyclomine, and amoxicillin-clavulanate.  No orders of the defined types were placed in this encounter.   Problem List Items Addressed This Visit      Unprioritized   Hypokalemia   Relevant Orders   Basic metabolic panel (Completed)    Other Visit Diagnoses    Pain of left calf    -  Primary   Relevant Orders   US Venous Img Lower Unilateral Left (DVT) (Completed)   SVT (supraventricular tachycardia) ([place])       Relevant Orders   Ambulatory referral to Cardiology   Pulmonary fibrosis [facility])       Relevant Orders   Ambulatory referral to Cardiology      Follow-up: Return if symptoms  worsen or fail to improve.  [name], DO

## 2019-05-02 ENCOUNTER — Telehealth: Payer: Self-pay | Admitting: Cardiovascular Disease

## 2019-05-02 NOTE — Telephone Encounter (Signed)
Patient would like to change providers from Dr. Claiborne Billings to Dr. Bettina Gavia in [facility]. She lives closer to the [facility] Office than [place].

## 2019-05-02 NOTE — Telephone Encounter (Signed)
Patient is calling back stating she would like to see Dr. Harriet Masson in [place].

## 2019-05-02 NOTE — Telephone Encounter (Signed)
Routed to MDs to okay change

## 2019-05-04 ENCOUNTER — Telehealth: Payer: Self-pay | Admitting: Family Medicine

## 2019-05-04 DIAGNOSIS — M79662 Pain in left lower leg: Secondary | ICD-10-CM

## 2019-05-04 NOTE — Telephone Encounter (Signed)
Pt does want referral to sports med please.

## 2019-05-04 NOTE — Telephone Encounter (Signed)
REFERRAL PLACED AND PATIENT NOTIFIED.

## 2019-05-04 NOTE — Telephone Encounter (Signed)
Was this discussed at last visit? Please advise

## 2019-05-05 NOTE — Telephone Encounter (Signed)
That will be fine with me. 

## 2019-05-06 ENCOUNTER — Other Ambulatory Visit (HOSPITAL_COMMUNITY)
Admission: RE | Admit: 2019-05-06 | Discharge: 2019-05-06 | Disposition: A | Discharge status: Home / Self Care | Payer: Medicare Other | Source: Ambulatory Visit | Attending: Internal Medicine | Admitting: Internal Medicine

## 2019-05-06 DIAGNOSIS — Z20822 Contact with and (suspected) exposure to covid-19: Secondary | ICD-10-CM | POA: Insufficient documentation

## 2019-05-07 LAB — SARS CORONAVIRUS 2 (TAT 6-24 HRS): SARS Coronavirus 2: NEGATIVE

## 2019-05-08 DIAGNOSIS — H43813 Vitreous degeneration, bilateral: Secondary | ICD-10-CM | POA: Diagnosis not present

## 2019-05-08 DIAGNOSIS — H16223 Keratoconjunctivitis sicca, not specified as Sjogren's, bilateral: Secondary | ICD-10-CM | POA: Diagnosis not present

## 2019-05-09 ENCOUNTER — Encounter: Payer: Self-pay | Admitting: Cardiology

## 2019-05-09 ENCOUNTER — Ambulatory Visit: Payer: Self-pay

## 2019-05-09 ENCOUNTER — Encounter: Payer: Self-pay | Admitting: Family Medicine

## 2019-05-09 ENCOUNTER — Ambulatory Visit (INDEPENDENT_AMBULATORY_CARE_PROVIDER_SITE_OTHER): Payer: Medicare Other | Admitting: Family Medicine

## 2019-05-09 ENCOUNTER — Ambulatory Visit (HOSPITAL_BASED_OUTPATIENT_CLINIC_OR_DEPARTMENT_OTHER)
Admission: RE | Admit: 2019-05-09 | Discharge: 2019-05-09 | Disposition: A | Discharge status: Home / Self Care | Payer: Medicare Other | Source: Ambulatory Visit | Attending: Family Medicine | Admitting: Family Medicine

## 2019-05-09 ENCOUNTER — Ambulatory Visit (INDEPENDENT_AMBULATORY_CARE_PROVIDER_SITE_OTHER): Payer: Medicare Other | Admitting: Cardiology

## 2019-05-09 ENCOUNTER — Other Ambulatory Visit: Payer: Self-pay

## 2019-05-09 VITALS — BP 132/84 | HR 78 | Ht 64.0 in | Wt 136.0 lb

## 2019-05-09 VITALS — BP 133/82 | HR 76 | Ht 64.0 in | Wt 136.0 lb

## 2019-05-09 DIAGNOSIS — M898X6 Other specified disorders of bone, lower leg: Secondary | ICD-10-CM | POA: Diagnosis not present

## 2019-05-09 DIAGNOSIS — M79662 Pain in left lower leg: Secondary | ICD-10-CM

## 2019-05-09 DIAGNOSIS — R55 Syncope and collapse: Secondary | ICD-10-CM | POA: Diagnosis not present

## 2019-05-09 DIAGNOSIS — I471 Supraventricular tachycardia: Secondary | ICD-10-CM

## 2019-05-09 DIAGNOSIS — E559 Vitamin D deficiency, unspecified: Secondary | ICD-10-CM

## 2019-05-09 DIAGNOSIS — R42 Dizziness and giddiness: Secondary | ICD-10-CM | POA: Diagnosis not present

## 2019-05-09 DIAGNOSIS — M79605 Pain in left leg: Secondary | ICD-10-CM | POA: Diagnosis not present

## 2019-05-09 DIAGNOSIS — E782 Mixed hyperlipidemia: Secondary | ICD-10-CM

## 2019-05-09 DIAGNOSIS — M25572 Pain in left ankle and joints of left foot: Secondary | ICD-10-CM | POA: Diagnosis not present

## 2019-05-09 DIAGNOSIS — I10 Essential (primary) hypertension: Secondary | ICD-10-CM

## 2019-05-09 MED ORDER — PREDNISONE 5 MG PO TABS: ORAL_TABLET | ORAL | 0 refills | Status: DC

## 2019-05-09 NOTE — Patient Instructions (Signed)
Medication Instructions:  Your physician recommends that you continue on your current medications as directed. Please refer to the Current Medication list given to you today.  *If you need a refill on your cardiac medications before your next appointment, please call your pharmacy*  Lab Work: None  If you have labs (blood work) drawn today and your tests are completely normal, you will receive your results only by: Marland Kitchen MyChart Message (if you have MyChart) OR . A paper copy in the mail If you have any lab test that is abnormal or we need to change your treatment, we will call you to review the results.  Testing/Procedures: Your physician has requested that you have an echocardiogram. Echocardiography is a painless test that uses sound waves to create images of your heart. It provides your doctor with information about the size and shape of your heart and how well your heart's chambers and valves are working. This procedure takes approximately one hour. There are no restrictions for this procedure.  Your physician has requested that you have a carotid duplex. This test is an ultrasound of the carotid arteries in your neck. It looks at blood flow through these arteries that supply the brain with blood. Allow one hour for this exam. There are no restrictions or special instructions.   Follow-Up: At [facility], you and your health needs are our priority.  As part of our continuing mission to provide you with exceptional heart care, we have created designated Provider Care Teams.  These Care Teams include your primary Cardiologist (physician) and Advanced Practice Providers (APPs -  Physician Assistants and Nurse Practitioners) who all work together to provide you with the care you need, when you need it.  Your next appointment:   3 month(s)  The format for your next appointment:   In Person  Provider:   Berniece Salines, DO  Other Instructions

## 2019-05-09 NOTE — Progress Notes (Signed)
Alexandria [name] - 74 y.o. female MRN [id number]  Date of birth: 1945/09/09  SUBJECTIVE:  Including CC & ROS.  Chief Complaint  Patient presents with  . Leg Pain    left calf    Alexandria [name] is a 74 y.o. female that is presenting with left calf and shin pain of the left lower leg.  Symptoms are acute on chronic in nature.  She denies any specific inciting event.  The pain is worse when she is ambulatory.  She denies any pain when she is sitting.  The pain is occurring in the calf region as well as the distal anterior tibia.  No history of similar pain..  Review of the venous duplex from 1/25 shows no evidence of DVT.   Review of Systems See HPI   HISTORY: Past Medical, Surgical, Social, and Family History Reviewed & Updated per EMR.   Pertinent Historical Findings include:  Past Medical History:  Diagnosis Date  . Arthritis   . Asthmatic bronchitis with acute exacerbation 02/06/2015  . Bronchial pneumonia   . Colon polyp   . Diverticulitis   . DVT (deep venous thrombosis) ([place])    lower extremity  . H/O cardiac catheterization 2004   Normal coronary arteries  . Heart murmur   . History of stress test 05/21/2011  . Hx of echocardiogram 01/27/2010   Normal Ef 55% the transmitral spectral doppler flow pattern is normal for age. the left ventricular wall motion is normal  . IPF (idiopathic pulmonary fibrosis) ([place]) 01/2018  . Migraines   . Pancreatitis   . Thyroid disease   . TIA (transient ischemic attack)    8 years ago    Past Surgical History:  Procedure Laterality Date  . BREAST BIOPSY     Isaiah Blakes  . CARDIAC CATHETERIZATION     10/2013  . CATARACT EXTRACTION Right 03/22/2018  . LEFT HEART CATHETERIZATION WITH CORONARY ANGIOGRAM N/A 10/25/2013   Procedure: LEFT HEART CATHETERIZATION WITH CORONARY ANGIOGRAM;  Surgeon: Troy Sine, MD;  Location: [facility] CATH LAB;  Service: Cardiovascular;  Laterality: N/A;  . NECK SURGERY     x's 2  . TOTAL KNEE  ARTHROPLASTY     Bilateral x's 2  . VAGINAL HYSTERECTOMY  10/17/1998   [place] BRONCHOSCOPY Bilateral 01/24/2018   Procedure: VIDEO BRONCHOSCOPY WITH FLUORO;  Surgeon: [name], MD;  Location: [place];  Service: Cardiopulmonary;  Laterality: Bilateral;    Allergies  Allergen Reactions  . Prednisone Other (See Comments)    Changed personality   . Atrovent Hfa [Ipratropium Bromide Hfa] Other (See Comments)    Pt could not sleep  . Cheratussin Ac [Guaifenesin-Codeine]   . Codeine Other (See Comments)    Pt takes promethazine with codeine at home  . Hydrocodone Itching  . Influenza Vaccines Other (See Comments)    Pt reports heart attack after last flu shot  . Motrin [Ibuprofen] Other (See Comments)    "Gives false reading in blood"  . Oxycodone-Acetaminophen Itching    Family History  Problem Relation Age of Onset  . Arthritis Other   . Colon cancer Father   . Prostate cancer Father   . HIV Brother        66  . Kidney cancer Brother   . Colon cancer Brother   . Lung cancer Brother   . Other Brother        Mouth Cancer  . Ovarian cancer Mother   . Uterine cancer Mother   .  Heart disease Maternal Grandmother   . Stroke Maternal Grandmother   . Hypertension Maternal Grandmother   . Diabetes Maternal Grandmother      Social History   Socioeconomic History  . Marital status: Married    Spouse name: [name]  . Number of children: 3  . Years of education: [employer]  . Highest education level: Not on file  Occupational History  . Occupation: DISABLED    Employer: UNEMPLOYED  Tobacco Use  . Smoking status: Former Smoker    Years: 2.00    Quit date: 04/06/1969    Years since quitting: 50.1  . Smokeless tobacco: Never Used  . Tobacco comment: socially smoked x 2 years  Substance and Sexual Activity  . Alcohol use: No    Alcohol/week: 0.0 standard drinks  . Drug use: No  . Sexual activity: Not on file  Other Topics Concern  . Not on file    Social History Narrative   Lives with husband [name]   Caffeine use: coffee (2 cups per day)   Mostly right-handed   Social Determinants of Health   Financial Resource Strain:   . Difficulty of Paying Living Expenses: Not on file  Food Insecurity:   . Worried About Charity fundraiser in the Last Year: Not on file  . Ran Out of Food in the Last Year: Not on file  Transportation Needs:   . Lack of Transportation (Medical): Not on file  . Lack of Transportation (Non-Medical): Not on file  Physical Activity:   . Days of Exercise per Week: Not on file  . Minutes of Exercise per Session: Not on file  Stress:   . Feeling of Stress : Not on file  Social Connections:   . Frequency of Communication with Friends and Family: Not on file  . Frequency of Social Gatherings with Friends and Family: Not on file  . Attends Religious Services: Not on file  . Active Member of Clubs or Organizations: Not on file  . Attends Archivist Meetings: Not on file  . Marital Status: Not on file  Intimate Partner Violence:   . Fear of Current or Ex-Partner: Not on file  . Emotionally Abused: Not on file  . Physically Abused: Not on file  . Sexually Abused: Not on file     PHYSICAL EXAM:  VS: BP 133/82   Pulse 76   Ht 5\' 4"  (1.626 m)   Wt 136 lb (61.7 kg)   BMI 23.34 kg/m  Physical Exam Gen: NAD, alert, cooperative with exam, well-appearing ENT: normal lips, normal nasal mucosa,  Eye: normal EOM, normal conjunctiva and lids Skin: no rashes, no areas of induration  Neuro: normal tone, normal sensation to touch Psych:  normal insight, alert and oriented MSK:  Left lower leg: Mild edema of the left lower leg. No tenderness or redness of the calf. Some tenderness to palpation over the distal anterior tibia. Normal knee range of motion. Normal ankle range of motion. Neurovascularly intact  Limited ultrasound: Left lower leg:  Normal-appearing ankle joint with no effusion. No  change of the cortex of the tibia from distal to proximal.  No increased hyperemia. No changes of the medial and lateral gastrocnemius. Interventricular septum between the lateral and medial gastrocnemius is normal. Normal insertion of the Achilles. No changes at the musculotendinous junction of the Achilles and calf  Summary: No findings as to the source of her pain on the tibia and in the gastrocnemius  Ultrasound and interpretation  by Clearance Coots, MD    ASSESSMENT & PLAN:   Pain of left calf Pain related to fatigue with low ferritin.  No structural changes observed on ultrasound.  Venous duplex was negative for clot.  Seems less likely for compartment in the posterior aspect but may be occurring in the lateral compartment.  Could be a radicular component from the back. -Iron and ferritin. - prednisone  -Could consider compartment testing  Tibial pain Having pain as well at the distal anterior tibia.  Could be shinsplints or stress reaction. -X-ray tib-fib and ankle. -May need to place in a cam walker. -Vitamin D

## 2019-05-09 NOTE — Patient Instructions (Signed)
Nice to meet you I sent the prednisone to try if you would like  I will call with the xray and lab results.   Please send me a message in MyChart with any questions or updates.  Please see me back in 3-4 weeks.   --Dr. Raeford Razor

## 2019-05-09 NOTE — Assessment & Plan Note (Signed)
Pain related to fatigue with low ferritin.  No structural changes observed on ultrasound.  Venous duplex was negative for clot.  Seems less likely for compartment in the posterior aspect but may be occurring in the lateral compartment.  Could be a radicular component from the back. -Iron and ferritin. - prednisone  -Could consider compartment testing

## 2019-05-09 NOTE — Assessment & Plan Note (Signed)
Having pain as well at the distal anterior tibia.  Could be shinsplints or stress reaction. -X-ray tib-fib and ankle. -May need to place in a cam walker. -Vitamin D

## 2019-05-09 NOTE — Progress Notes (Signed)
Cardiology Office Note:    Date:  05/09/2019   ID:  Alexandria [name], DOB 11-Mar-1946, MRN [id number]  PCP:  Berniece Salines, DO  Cardiologist:  Shelva Majestic, MD  Electrophysiologist:  None   Referring MD: Carollee Herter, Alferd Apa, *   Chief Complaint  Patient presents with  . Establish Care  . Near Syncope    History of Present Illness:    Alexandria [name] is a 74 y.o. female with a hx of vasospasm, history of 2 separate left heart catheterization in 2004 and in 2015 which were all reported to be normal, diastolic dysfunction, history of TIAs, history of DVTs, paroxysmal supraventricular tachycardia, idiopathic pulmonary fibrosis and chronic venous insufficiency.  The patient is here to transfer cardiac care due to location of facility.  She initially follows with Dr. Shelva Majestic and was last seen by him in October 23rd 2020.  During her visit with Dr. Claiborne Billings did discuss her medications and was noted that her palpitations were managed with metoprolol succinate, she was being managed for her chronic leg edema with torsemide 40 mg daily.  In addition to reestablishing cardiac care she tells me that she has been experiencing dizziness and has had multiple near syncope episodes.  She describes it as a sensation as she feels from the head down that her whole body is draining off but has never really blacked out.  She is very concerned about the symptoms.    Past Medical History:  Diagnosis Date  . Arthritis   . Asthmatic bronchitis with acute exacerbation 02/06/2015  . Bronchial pneumonia   . Colon polyp   . Diverticulitis   . DVT (deep venous thrombosis) ([place])    lower extremity  . H/O cardiac catheterization 2004   Normal coronary arteries  . Heart murmur   . History of stress test 05/21/2011  . Hx of echocardiogram 01/27/2010   Normal Ef 55% the transmitral spectral doppler flow pattern is normal for age. the left ventricular wall motion is normal  . IPF (idiopathic pulmonary  fibrosis) ([place]) 01/2018  . Migraines   . Pancreatitis   . Thyroid disease   . TIA (transient ischemic attack)    8 years ago    Past Surgical History:  Procedure Laterality Date  . BREAST BIOPSY     Isaiah Blakes  . CARDIAC CATHETERIZATION     10/2013  . CATARACT EXTRACTION Right 03/22/2018  . LEFT HEART CATHETERIZATION WITH CORONARY ANGIOGRAM N/A 10/25/2013   Procedure: LEFT HEART CATHETERIZATION WITH CORONARY ANGIOGRAM;  Surgeon: Troy Sine, MD;  Location: [facility] CATH LAB;  Service: Cardiovascular;  Laterality: N/A;  . NECK SURGERY     x's 2  . TOTAL KNEE ARTHROPLASTY     Bilateral x's 2  . VAGINAL HYSTERECTOMY  10/17/1998   [place] BRONCHOSCOPY Bilateral 01/24/2018   Procedure: VIDEO BRONCHOSCOPY WITH FLUORO;  Surgeon: [name], MD;  Location: [place];  Service: Cardiopulmonary;  Laterality: Bilateral;    Current Medications: Current Meds  Medication Sig  . benzonatate (TESSALON) 100 MG capsule TAKE 1 CAPSULE BY MOUTH 3 TIMES A DAY AS NEEDED FOR COUGH  . Biotin (RA BIOTIN) 1000 MCG tablet Take 1,000 mcg by mouth 2 (two) times daily.   . clopidogrel (PLAVIX) 75 MG tablet TAKE 1 TABLET BY MOUTH EVERY DAY  . dicyclomine (BENTYL) 20 MG tablet TAKE 1 TABLET BY MOUTH FOUR TIMES A DAY AS NEEDED  . furosemide (LASIX) 40 MG tablet Take 1 tablet (  40 mg total) by mouth 2 (two) times daily. (Patient taking differently: Take 40 mg by mouth daily. If needed twice a day)  . KLOR-CON M20 20 MEQ tablet Take 1 tablet (20 mEq total) by mouth 2 (two) times daily. Will take 3 a daily only as needed.  . metoprolol succinate (TOPROL-XL) 25 MG 24 hr tablet TAKE 1 TABLET BY MOUTH EVERY DAY  . Multiple Vitamin (MULTIVITAMIN) tablet Take 1 tablet by mouth every other day. Gluten free/Vegetarian Multivitamin  . Nintedanib (OFEV) 150 MG CAPS Take 1 capsule (150 mg total) by mouth 2 (two) times daily.  Marland Kitchen SYNTHROID 112 MCG tablet TAKE 1 TABLET BY MOUTH EVERY DAY BEFORE BREAKFAST  .  vitamin C (ASCORBIC ACID) 500 MG tablet Take 1,000 mg by mouth 2 (two) times daily.   . vitamin E 1000 UNIT capsule Take 1,000 Units by mouth daily.     Allergies:   Prednisone, Atrovent hfa [ipratropium bromide hfa], Cheratussin ac [guaifenesin-codeine], Codeine, Hydrocodone, Influenza vaccines, Motrin [ibuprofen], and Oxycodone-acetaminophen   Social History   Socioeconomic History  . Marital status: Married    Spouse name: [name]  . Number of children: 3  . Years of education: [employer]  . Highest education level: Not on file  Occupational History  . Occupation: DISABLED    Employer: UNEMPLOYED  Tobacco Use  . Smoking status: Former Smoker    Years: 2.00    Quit date: 04/06/1969    Years since quitting: 50.1  . Smokeless tobacco: Never Used  . Tobacco comment: socially smoked x 2 years  Substance and Sexual Activity  . Alcohol use: No    Alcohol/week: 0.0 standard drinks  . Drug use: No  . Sexual activity: Not on file  Other Topics Concern  . Not on file  Social History Narrative   Lives with husband [name]   Caffeine use: coffee (2 cups per day)   Mostly right-handed   Social Determinants of Health   Financial Resource Strain:   . Difficulty of Paying Living Expenses: Not on file  Food Insecurity:   . Worried About Charity fundraiser in the Last Year: Not on file  . Ran Out of Food in the Last Year: Not on file  Transportation Needs:   . Lack of Transportation (Medical): Not on file  . Lack of Transportation (Non-Medical): Not on file  Physical Activity:   . Days of Exercise per Week: Not on file  . Minutes of Exercise per Session: Not on file  Stress:   . Feeling of Stress : Not on file  Social Connections:   . Frequency of Communication with Friends and Family: Not on file  . Frequency of Social Gatherings with Friends and Family: Not on file  . Attends Religious Services: Not on file  . Active Member of Clubs or Organizations: Not on file  . Attends English as a second language teacher Meetings: Not on file  . Marital Status: Not on file     Family History: The patient's family history includes Arthritis in an other family member; Colon cancer in her brother and father; Diabetes in her maternal grandmother; HIV in her brother; Heart disease in her maternal grandmother; Hypertension in her maternal grandmother; Kidney cancer in her brother; Lung cancer in her brother; Other in her brother; Ovarian cancer in her mother; Prostate cancer in her father; Stroke in her maternal grandmother; Uterine cancer in her mother.  ROS:   Review of Systems  Constitution: Negative for decreased appetite, fever  and weight gain.  HENT: Negative for congestion, ear discharge, hoarse voice and sore throat.   Eyes: Negative for discharge, redness, vision loss in right eye and visual halos.  Cardiovascular: Negative for chest pain, dyspnea on exertion, leg swelling, orthopnea and palpitations.  Respiratory: Negative for cough, hemoptysis, shortness of breath and snoring.   Endocrine: Negative for heat intolerance and polyphagia.  Hematologic/Lymphatic: Negative for bleeding problem. Does not bruise/bleed easily.  Skin: Negative for flushing, nail changes, rash and suspicious lesions.  Musculoskeletal: Negative for arthritis, joint pain, muscle cramps, myalgias, neck pain and stiffness.  Gastrointestinal: Negative for abdominal pain, bowel incontinence, diarrhea and excessive appetite.  Genitourinary: Negative for decreased libido, genital sores and incomplete emptying.  Neurological: Negative for brief paralysis, focal weakness, headaches and loss of balance.  Psychiatric/Behavioral: Negative for altered mental status, depression and suicidal ideas.  Allergic/Immunologic: Negative for HIV exposure and persistent infections.    EKGs/Labs/Other Studies Reviewed:    The following studies were reviewed today:   EKG: None today  Transthoracic echocardiogram March 2019  Left  ventricle: The cavity size was normal. Wall thickness was  normal. Systolic function was normal. The estimated ejection  fraction was in the range of 55% to 60%. Wall motion was normal;  there were no regional wall motion abnormalities. Doppler  parameters are consistent with abnormal left ventricular  relaxation (grade 1 diastolic dysfunction). Doppler parameters  are consistent with high ventricular filling pressure.  - Aortic valve: There was mild regurgitation.  - Mitral valve: Calcified annulus. There was mild regurgitation.  ZIO monitor The patient was monitored for 22 hours.  The predominant rhythm was sinus rhythm with an average heart rate at 68 bpm.  The slowest heart rate was sinus bradycardia at 46 bpm.  There were 11 episodes of SVT with the fastest interval lasting 12.2 seconds with a maximum rate at 235 bpm (average 204 bpm.)  There were rare PACs.  There were occasional isolated PVCs representing 2.7%.  There were no episodes of ventricular couplets or triplets.  Recent Labs: 02/07/2019: ALT 22; Hemoglobin 13.4; Platelets 229.0; TSH 1.13 05/01/2019: BUN 14; Creatinine, Ser 0.89; Potassium 3.9; Sodium 138  Recent Lipid Panel    Component Value Date/Time   CHOL 199 02/07/2019 1218   TRIG 73.0 02/07/2019 1218   HDL 63.90 02/07/2019 1218   CHOLHDL 3 02/07/2019 1218   VLDL 14.6 02/07/2019 1218   LDLCALC 120 (H) 02/07/2019 1218    Physical Exam:    VS:  BP 132/84 (BP Location: Right Arm, Patient Position: Sitting, Cuff Size: Normal)   Pulse 78   Ht 5\' 4"  (1.626 m)   Wt 136 lb (61.7 kg)   SpO2 96%   BMI 23.34 kg/m     Wt Readings from Last 3 Encounters:  05/09/19 136 lb (61.7 kg)  05/09/19 136 lb (61.7 kg)  05/01/19 138 lb 6.4 oz (62.8 kg)     GEN: Well nourished, well developed in no acute distress HEENT: Normal NECK: No JVD; No carotid bruits LYMPHATICS: No lymphadenopathy CARDIAC: S1S2 noted,RRR, 26/ murmurs, rubs, gallops RESPIRATORY:  Clear to  auscultation without rales, wheezing or rhonchi  ABDOMEN: Soft, non-tender, non-distended, +bowel sounds, no guarding. EXTREMITIES: No edema, No cyanosis, no clubbing MUSCULOSKELETAL:  No deformity  SKIN: Warm and dry NEUROLOGIC:  Alert and oriented x 3, non-focal PSYCHIATRIC:  Normal affect, good insight  ASSESSMENT:    1. Dizziness   2. Near syncope   3. Essential hypertension   4. Mixed hyperlipidemia  5. PSVT (paroxysmal supraventricular tachycardia) (HCC)    PLAN:    1.  The patient orthostatic vitals were manually taken by me in the office today which was negative.  At this time her repeat echocardiogram has also been ordered prior which have asked the patient to schedule this.  In addition I like to bilateral carotid ultrasound to rule out any evidence of carotid artery stenosis.  2.  She will be maintained on her Lasix 40 mg however have asked her to take that Tuesday Thursdays and Saturday to see if she has any improvement on the days without the diuretics.  3.  She will continue her metoprolol succinate for her paroxysmal supraventricular tachycardia.  4.  Hypertension-blood pressures acceptable in the office today therefore no changes will be made to her antihypertensive regimen.  5.  Hyperlipidemia-she continues to eat healthy diet we will repeat her lipid profile and further recommendation will be made.  The patient is in agreement with the above plan. The patient left the office in stable condition.  The patient will follow up in 3 months or sooner if needed.   Medication Adjustments/Labs and Tests Ordered: Current medicines are reviewed at length with the patient today.  Concerns regarding medicines are outlined above.  Orders Placed This Encounter  Procedures  . ECHOCARDIOGRAM COMPLETE  . VAS US CAROTID   No orders of the defined types were placed in this encounter.   Patient Instructions  Medication Instructions:  Your physician recommends that you  continue on your current medications as directed. Please refer to the Current Medication list given to you today.  *If you need a refill on your cardiac medications before your next appointment, please call your pharmacy*  Lab Work: None  If you have labs (blood work) drawn today and your tests are completely normal, you will receive your results only by: Marland Kitchen MyChart Message (if you have MyChart) OR . A paper copy in the mail If you have any lab test that is abnormal or we need to change your treatment, we will call you to review the results.  Testing/Procedures: Your physician has requested that you have an echocardiogram. Echocardiography is a painless test that uses sound waves to create images of your heart. It provides your doctor with information about the size and shape of your heart and how well your heart's chambers and valves are working. This procedure takes approximately one hour. There are no restrictions for this procedure.  Your physician has requested that you have a carotid duplex. This test is an ultrasound of the carotid arteries in your neck. It looks at blood flow through these arteries that supply the brain with blood. Allow one hour for this exam. There are no restrictions or special instructions.   Follow-Up: At [facility], you and your health needs are our priority.  As part of our continuing mission to provide you with exceptional heart care, we have created designated Provider Care Teams.  These Care Teams include your primary Cardiologist (physician) and Advanced Practice Providers (APPs -  Physician Assistants and Nurse Practitioners) who all work together to provide you with the care you need, when you need it.  Your next appointment:   3 month(s)  The format for your next appointment:   In Person  Provider:   Berniece Salines, DO  Other Instructions      Adopting a Healthy Lifestyle.  Know what a healthy weight is for you (roughly BMI <25) and aim to  maintain this  Aim for 7+ servings of fruits and vegetables daily   65-80+ fluid ounces of water or unsweet tea for healthy kidneys   Limit to max 1 drink of alcohol per day; avoid smoking/tobacco   Limit animal fats in diet for cholesterol and heart health - choose grass fed whenever available   Avoid highly processed foods, and foods high in saturated/trans fats   Aim for low stress - take time to unwind and care for your mental health   Aim for 150 min of moderate intensity exercise weekly for heart health, and weights twice weekly for bone health   Aim for 7-9 hours of sleep daily   When it comes to diets, agreement about the perfect plan isnt easy to find, even among the experts. Experts at the [place] developed an idea known as the Healthy Eating Plate. Just imagine a plate divided into logical, healthy portions.   The emphasis is on diet quality:   Load up on vegetables and fruits - one-half of your plate: Aim for color and variety, and remember that potatoes dont count.   Go for whole grains - one-quarter of your plate: Whole wheat, barley, wheat berries, quinoa, oats, brown rice, and foods made with them. If you want pasta, go with whole wheat pasta.   Protein power - one-quarter of your plate: Fish, chicken, beans, and nuts are all healthy, versatile protein sources. Limit red meat.   The diet, however, does go beyond the plate, offering a few other suggestions.   Use healthy plant oils, such as olive, canola, soy, corn, sunflower and peanut. Check the labels, and avoid partially hydrogenated oil, which have unhealthy trans fats.   If youre thirsty, drink water. Coffee and tea are good in moderation, but skip sugary drinks and limit milk and dairy products to one or two daily servings.   The type of carbohydrate in the diet is more important than the amount. Some sources of carbohydrates, such as vegetables, fruits, whole grains, and beans-are  healthier than others.   Finally, stay active  Signed, Berniece Salines, DO  05/09/2019 3:23 PM    [facility]

## 2019-05-10 ENCOUNTER — Telehealth: Payer: Self-pay | Admitting: Family Medicine

## 2019-05-10 ENCOUNTER — Ambulatory Visit (INDEPENDENT_AMBULATORY_CARE_PROVIDER_SITE_OTHER): Payer: Medicare Other | Admitting: Internal Medicine

## 2019-05-10 DIAGNOSIS — J84112 Idiopathic pulmonary fibrosis: Secondary | ICD-10-CM

## 2019-05-10 LAB — PULMONARY FUNCTION TEST
DL/VA % pred: 102 %
DL/VA: 4.21 ml/min/mmHg/L
DLCO unc % pred: 71 %
DLCO unc: 13.66 ml/min/mmHg
FEF 25-75 Pre: 1.99 L/sec
FEF2575-%Pred-Pre: 116 %
FEV1-%Pred-Pre: 84 %
FEV1-Pre: 1.81 L
FEV1FVC-%Pred-Pre: 111 %
FEV6-%Pred-Pre: 79 %
FEV6-Pre: 2.15 L
FEV6FVC-%Pred-Pre: 105 %
FVC-%Pred-Pre: 75 %
FVC-Pre: 2.15 L
Pre FEV1/FVC ratio: 84 %
Pre FEV6/FVC Ratio: 100 %

## 2019-05-10 LAB — IRON,TIBC AND FERRITIN PANEL
Ferritin: 179 ng/mL — ABNORMAL HIGH (ref 15–150)
Iron Saturation: 34 % (ref 15–55)
Iron: 82 ug/dL (ref 27–139)
Total Iron Binding Capacity: 243 ug/dL — ABNORMAL LOW (ref 250–450)
UIBC: 161 ug/dL (ref 118–369)

## 2019-05-10 LAB — VITAMIN D 25 HYDROXY (VIT D DEFICIENCY, FRACTURES): Vit D, 25-Hydroxy: 29.9 ng/mL — ABNORMAL LOW (ref 30.0–100.0)

## 2019-05-10 MED ORDER — VITAMIN D (ERGOCALCIFEROL) 1.25 MG (50000 UNIT) PO CAPS: 50000.0000 [IU] | ORAL_CAPSULE | ORAL | 0 refills | Status: DC

## 2019-05-10 NOTE — Telephone Encounter (Signed)
Patient returning call for results 

## 2019-05-10 NOTE — Addendum Note (Signed)
Addended by: Rosemarie Ax on: 05/10/2019 04:40 PM   Modules accepted: Orders

## 2019-05-10 NOTE — Telephone Encounter (Signed)
Left VM for patient. If she calls back please have her speak with a nurse/CMA and inform that her xrays show the post surgical hardware in good position. The xrays show no bone abnormalities. Her ferritin was elevated which is something to watch.  Her vitamin D is low and we can replenish this if she would like.   If any questions then please take the best time and phone number to call and I will try to call her back.   Rosemarie Ax, MD [facility] 05/10/2019, 9:12 AM

## 2019-05-10 NOTE — Telephone Encounter (Signed)
Sent in vitamin Johnney Killian, MD [facility] 05/10/2019, 4:40 PM

## 2019-05-10 NOTE — Telephone Encounter (Signed)
Okay with me 

## 2019-05-10 NOTE — Telephone Encounter (Signed)
Spoke to patient and gave her information provided by the physician.

## 2019-05-10 NOTE — Telephone Encounter (Signed)
Patient requesting Vitamin D prescription be sent to [facility] at [address] across from [facility]

## 2019-05-10 NOTE — Progress Notes (Signed)
Spirometry and Dlco done today. 

## 2019-05-12 ENCOUNTER — Ambulatory Visit (HOSPITAL_BASED_OUTPATIENT_CLINIC_OR_DEPARTMENT_OTHER)
Admission: RE | Admit: 2019-05-12 | Discharge: 2019-05-12 | Disposition: A | Discharge status: Home / Self Care | Payer: Medicare Other | Source: Ambulatory Visit | Attending: Cardiology | Admitting: Cardiology

## 2019-05-12 ENCOUNTER — Other Ambulatory Visit: Payer: Self-pay

## 2019-05-12 DIAGNOSIS — I10 Essential (primary) hypertension: Secondary | ICD-10-CM | POA: Diagnosis not present

## 2019-05-12 DIAGNOSIS — R55 Syncope and collapse: Secondary | ICD-10-CM | POA: Diagnosis not present

## 2019-05-12 DIAGNOSIS — R42 Dizziness and giddiness: Secondary | ICD-10-CM | POA: Diagnosis not present

## 2019-05-12 NOTE — Progress Notes (Signed)
  Echocardiogram 2D Echocardiogram has been performed.  Alexandria [name] 05/12/2019, 3:47 PM

## 2019-05-12 NOTE — Progress Notes (Signed)
VAS US CAROTID DUPLEX BILATERAL    05/12/19 Alexandria [name]

## 2019-05-16 ENCOUNTER — Telehealth: Payer: Self-pay | Admitting: *Deleted

## 2019-05-16 NOTE — Telephone Encounter (Signed)
Telephone call to patient. Left message per DPR with results of echo and carotid ultrasound.

## 2019-05-16 NOTE — Telephone Encounter (Signed)
-----   Message from Berniece Salines, DO sent at 05/16/2019  1:26 PM EST ----- The echo showed impaired relaxation, mild mitral regurgitation and mild dilatation of the ascending aorta.Carotid artery duplex is normal

## 2019-05-22 ENCOUNTER — Telehealth: Payer: Self-pay | Admitting: Family Medicine

## 2019-05-22 NOTE — Telephone Encounter (Signed)
Caller Name: Calla, Thor[place] pt Phone: [phone]  Pt has pain in her eyes, sometimes both, primarily the left. She is asking if we can run blood test to check for NMO (Neuromyelitis optica). She has eye pain and colors appearing (sees blue cloud when it isn't there).

## 2019-05-23 NOTE — Telephone Encounter (Signed)
Normally opth would order--- has she spoken to her eye dr?

## 2019-05-23 NOTE — Telephone Encounter (Signed)
Please advise 

## 2019-05-24 ENCOUNTER — Encounter (HOSPITAL_BASED_OUTPATIENT_CLINIC_OR_DEPARTMENT_OTHER): Payer: Self-pay | Admitting: *Deleted

## 2019-05-24 ENCOUNTER — Emergency Department (HOSPITAL_BASED_OUTPATIENT_CLINIC_OR_DEPARTMENT_OTHER): Payer: Medicare Other

## 2019-05-24 ENCOUNTER — Emergency Department (HOSPITAL_BASED_OUTPATIENT_CLINIC_OR_DEPARTMENT_OTHER)
Admission: EM | Admit: 2019-05-24 | Discharge: 2019-05-24 | Disposition: A | Discharge status: Home / Self Care | Payer: Medicare Other | Attending: Emergency Medicine | Admitting: Emergency Medicine

## 2019-05-24 ENCOUNTER — Other Ambulatory Visit: Payer: Self-pay

## 2019-05-24 DIAGNOSIS — Y999 Unspecified external cause status: Secondary | ICD-10-CM | POA: Diagnosis not present

## 2019-05-24 DIAGNOSIS — Z9861 Coronary angioplasty status: Secondary | ICD-10-CM | POA: Insufficient documentation

## 2019-05-24 DIAGNOSIS — S6991XA Unspecified injury of right wrist, hand and finger(s), initial encounter: Secondary | ICD-10-CM | POA: Diagnosis present

## 2019-05-24 DIAGNOSIS — W2201XA Walked into wall, initial encounter: Secondary | ICD-10-CM | POA: Diagnosis not present

## 2019-05-24 DIAGNOSIS — S62656A Nondisplaced fracture of medial phalanx of right little finger, initial encounter for closed fracture: Secondary | ICD-10-CM | POA: Diagnosis not present

## 2019-05-24 DIAGNOSIS — S62606A Fracture of unspecified phalanx of right little finger, initial encounter for closed fracture: Secondary | ICD-10-CM

## 2019-05-24 DIAGNOSIS — Z7902 Long term (current) use of antithrombotics/antiplatelets: Secondary | ICD-10-CM | POA: Diagnosis not present

## 2019-05-24 DIAGNOSIS — Y9222 Religious institution as the place of occurrence of the external cause: Secondary | ICD-10-CM | POA: Insufficient documentation

## 2019-05-24 DIAGNOSIS — Z8673 Personal history of transient ischemic attack (TIA), and cerebral infarction without residual deficits: Secondary | ICD-10-CM | POA: Diagnosis not present

## 2019-05-24 DIAGNOSIS — S63276A Dislocation of unspecified interphalangeal joint of right little finger, initial encounter: Secondary | ICD-10-CM | POA: Diagnosis not present

## 2019-05-24 DIAGNOSIS — Z86718 Personal history of other venous thrombosis and embolism: Secondary | ICD-10-CM | POA: Diagnosis not present

## 2019-05-24 DIAGNOSIS — Z888 Allergy status to other drugs, medicaments and biological substances status: Secondary | ICD-10-CM | POA: Insufficient documentation

## 2019-05-24 DIAGNOSIS — S63259A Unspecified dislocation of unspecified finger, initial encounter: Secondary | ICD-10-CM

## 2019-05-24 DIAGNOSIS — Y9389 Activity, other specified: Secondary | ICD-10-CM | POA: Diagnosis not present

## 2019-05-24 DIAGNOSIS — Z87891 Personal history of nicotine dependence: Secondary | ICD-10-CM | POA: Diagnosis not present

## 2019-05-24 DIAGNOSIS — Z79899 Other long term (current) drug therapy: Secondary | ICD-10-CM | POA: Diagnosis not present

## 2019-05-24 DIAGNOSIS — S63286A Dislocation of proximal interphalangeal joint of right little finger, initial encounter: Secondary | ICD-10-CM | POA: Diagnosis not present

## 2019-05-24 MED ORDER — LIDOCAINE HCL (PF) 1 % IJ SOLN
10.0000 mL | Freq: Once | INTRAMUSCULAR | Status: AC
  Administered 2019-05-24: 10 mL via INTRADERMAL
  Filled 2019-05-24: qty 10

## 2019-05-24 NOTE — Telephone Encounter (Signed)
VM left with advice

## 2019-05-24 NOTE — ED Provider Notes (Signed)
[facility] EMERGENCY DEPARTMENT Provider Note   CSN: [id number] Arrival date & time: 05/24/19  1556     History Chief Complaint  Patient presents with   Finger Injury    Alexandria [name] is a 74 y.o. female presented to emergency department with right hand pain after a fall.  She reports she was at church today and lost her footing and reached out to brace herself against the wall with her right hand.  She believes she stubbed her finger the fifth 1 on the right hand against the wall.  She noticed an immediate deformity.  This happened around 3 PM.  She came to the emergency department.  Denies numbness in hand.    HPI     Past Medical History:  Diagnosis Date   Arthritis    Asthmatic bronchitis with acute exacerbation 02/06/2015   Bronchial pneumonia    Colon polyp    Diverticulitis    DVT (deep venous thrombosis) (HCC)    lower extremity   H/O cardiac catheterization 2004   Normal coronary arteries   Heart murmur    History of stress test 05/21/2011   Hx of echocardiogram 01/27/2010   Normal Ef 55% the transmitral spectral doppler flow pattern is normal for age. the left ventricular wall motion is normal   IPF (idiopathic pulmonary fibrosis) ([place]) 01/2018   Migraines    Pancreatitis    Thyroid disease    TIA (transient ischemic attack)    8 years ago    Patient Active Problem List   Diagnosis Date Noted   Pain of left calf 05/09/2019   Tibial pain 05/09/2019   Essential hypertension 02/08/2019   Healthcare maintenance 11/10/2018   Bradycardia 07/22/2018   Pansinusitis 06/23/2018   History of transient ischemic attack (TIA) 03/24/2018   IPF (idiopathic pulmonary fibrosis) ([place]) 03/24/2018   Allergic rhinitis 02/16/2018   Eustachian tube dysfunction, bilateral 02/16/2018   URI (upper respiratory infection) 02/16/2018   ILD (interstitial lung disease) ([place]) 01/07/2018   Dyspnea 01/07/2018   Acute respiratory failure  ([place]) 01/06/2018   TIA (transient ischemic attack) 01/06/2018   Hypokalemia 12/15/2017   Chronic cough 11/26/2017   Basal cell carcinoma (BCC) of neck 06/07/2017   Cardiac murmur 0000000   Diastolic dysfunction 0000000   Situational anxiety 05/07/2017   Headache 05/07/2017   Cellulitis of left lower extremity 04/30/2017   Migraine without status migrainosus, not intractable 06/09/2016   Insomnia 11/18/2015   Chest pain at rest 05/20/2015   RBBB 05/20/2015   Abnormal CXR 05/20/2015   Hx of myocardial infarction 05/08/2015   Bruising 05/08/2015   Upper airway cough syndrome 02/14/2015   Asthmatic bronchitis with acute exacerbation 02/06/2015   Hypothyroidism 10/25/2014   Fatigue 10/25/2014   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   Chronic venous insufficiency 10/24/2014   Varicose veins of leg with complications [id number]   Degeneration of intervertebral disc of cervical region 06/29/2014   Palpitations 11/08/2013   SOB (shortness of breath) 10/03/2013   DOE (dyspnea on exertion) 10/03/2013   Edema of both legs 10/03/2013   Hair loss 11/25/2012   Diverticulitis 07/14/2012   Hammer toe 06/13/2012   Recurrent abdominal pain 04/17/2012   Hypoglycemia 01/06/2012    Past Surgical History:  Procedure Laterality Date   BREAST BIOPSY     Isaiah Blakes   CARDIAC CATHETERIZATION     10/2013   CATARACT EXTRACTION Right 03/22/2018   LEFT HEART CATHETERIZATION WITH CORONARY ANGIOGRAM N/A 10/25/2013   Procedure: LEFT HEART CATHETERIZATION WITH  CORONARY ANGIOGRAM;  Surgeon: Troy Sine, MD;  Location: [facility] CATH LAB;  Service: Cardiovascular;  Laterality: N/A;   NECK SURGERY     x's 2   TOTAL KNEE ARTHROPLASTY     Bilateral x's 2   VAGINAL HYSTERECTOMY  10/17/1998   [name]   VIDEO BRONCHOSCOPY Bilateral 01/24/2018   Procedure: VIDEO BRONCHOSCOPY WITH FLUORO;  Surgeon: [name], MD;  Location: [facility];  Service: Cardiopulmonary;   Laterality: Bilateral;     OB History   No obstetric history on file.     Family History  Problem Relation Age of Onset   Arthritis Other    Colon cancer Father    Prostate cancer Father    HIV Brother        31   Kidney cancer Brother    Colon cancer Brother    Lung cancer Brother    Other Brother        Mouth Cancer   Ovarian cancer Mother    Uterine cancer Mother    Heart disease Maternal Grandmother    Stroke Maternal Grandmother    Hypertension Maternal Grandmother    Diabetes Maternal Grandmother     Social History   Tobacco Use   Smoking status: Former Smoker    Years: 2.00    Quit date: 04/06/1969    Years since quitting: 50.1   Smokeless tobacco: Never Used   Tobacco comment: socially smoked x 2 years  Substance Use Topics   Alcohol use: No    Alcohol/week: 0.0 standard drinks   Drug use: No    Home Medications Prior to Admission medications   Medication Sig Start Date End Date Taking? Authorizing Provider  benzonatate (TESSALON) 100 MG capsule TAKE 1 CAPSULE BY MOUTH 3 TIMES A DAY AS NEEDED FOR COUGH 08/31/18   Magdalen Spatz, NP  Biotin (RA BIOTIN) 1000 MCG tablet Take 1,000 mcg by mouth 2 (two) times daily.     [provider]  clopidogrel (PLAVIX) 75 MG tablet TAKE 1 TABLET BY MOUTH EVERY DAY 11/16/18   Carollee Herter, Alferd Apa, DO  dicyclomine (BENTYL) 20 MG tablet TAKE 1 TABLET BY MOUTH FOUR TIMES A DAY AS NEEDED 04/11/19   Carollee Herter, Alferd Apa, DO  furosemide (LASIX) 40 MG tablet Take 1 tablet (40 mg total) by mouth 2 (two) times daily. Patient taking differently: Take 40 mg by mouth daily. If needed twice a day 12/23/18   Carollee Herter, Yvonne R, DO  KLOR-CON M20 20 MEQ tablet Take 1 tablet (20 mEq total) by mouth 2 (two) times daily. Will take 3 a daily only as needed. 03/23/19   Roma Schanz R, DO  metoprolol succinate (TOPROL-XL) 25 MG 24 hr tablet TAKE 1 TABLET BY MOUTH EVERY DAY 03/27/19   Troy Sine, MD    Multiple Vitamin (MULTIVITAMIN) tablet Take 1 tablet by mouth every other day. Gluten free/Vegetarian Multivitamin    [provider]  Nintedanib (OFEV) 150 MG CAPS Take 1 capsule (150 mg total) by mouth 2 (two) times daily. 12/20/18   Brand Males, MD  SYNTHROID 112 MCG tablet TAKE 1 TABLET BY MOUTH EVERY DAY BEFORE BREAKFAST 03/29/19   Carollee Herter, Alferd Apa, DO  vitamin C (ASCORBIC ACID) 500 MG tablet Take 1,000 mg by mouth 2 (two) times daily.     [provider]  Vitamin D, Ergocalciferol, (DRISDOL) 1.25 MG (50000 UNIT) CAPS capsule Take 1 capsule (50,000 Units total) by mouth every 7 (seven)  days. Take for 8 total doses(weeks) 05/10/19   Rosemarie Ax, MD  vitamin E 1000 UNIT capsule Take 1,000 Units by mouth daily.    [provider]    Allergies    Prednisone, Atrovent hfa [ipratropium bromide hfa], Cheratussin ac [guaifenesin-codeine], Codeine, Hydrocodone, Influenza vaccines, Motrin [ibuprofen], and Oxycodone-acetaminophen  Review of Systems   Review of Systems  Musculoskeletal: Positive for arthralgias and myalgias.  Skin: Negative for rash and wound.  Neurological: Negative for numbness and headaches.  All other systems reviewed and are negative.   Physical Exam Updated Vital Signs BP (!) 141/96 (BP Location: Left Arm)    Pulse 82    Temp 98.5 F (36.9 C) (Oral)    Resp 18    Ht 5\' 4"  (1.626 m)    Wt 61 kg    SpO2 97%    BMI 23.08 kg/m   Physical Exam Vitals and nursing note reviewed.  Constitutional:      General: She is not in acute distress.    Appearance: She is well-developed.  HENT:     Head: Normocephalic and atraumatic.  Eyes:     Conjunctiva/sclera: Conjunctivae normal.  Cardiovascular:     Rate and Rhythm: Normal rate and regular rhythm.     Pulses: Normal pulses.  Pulmonary:     Effort: Pulmonary effort is normal. No respiratory distress.  Musculoskeletal:     Cervical back: Neck supple.     Comments: Deformity of the  right 5th digit, no open fracture TTP along DIP  Skin:    General: Skin is warm and dry.     Capillary Refill: Capillary refill takes less than 2 seconds.  Neurological:     General: No focal deficit present.     Mental Status: She is alert.     Sensory: No sensory deficit.     ED Results / Procedures / Treatments   Labs (all labs ordered are listed, but only abnormal results are displayed) Labs Reviewed - No data to display  EKG None  Radiology DG Finger Little Right  Result Date: 05/24/2019 CLINICAL DATA:  Fall with finger injury EXAM: RIGHT LITTLE FINGER 2+V COMPARISON:  None. FINDINGS: Dorsal and ulnar displacement base of the middle phalanx with respect to the head of the proximal phalanx. Suspected tiny fractures at the base of the middle phalanx and head of the proximal phalanx. IMPRESSION: Dorsal and ulnar dislocation base of the middle phalanx with respect to the head of the proximal phalanx with suspected small fractures at the base of the middle phalanx and head of the proximal phalanx. Electronically Signed   By: Donavan Foil M.D.   On: 05/24/2019 16:29    Procedures Reduction of dislocation  Date/Time: 05/25/2019 11:51 AM Performed by: Wyvonnia Dusky, MD Authorized by: Wyvonnia Dusky, MD  Consent: Verbal consent obtained. Risks and benefits: risks, benefits and alternatives were discussed Consent given by: patient Patient understanding: patient states understanding of the procedure being performed Patient consent: the patient's understanding of the procedure matches consent given Procedure consent: procedure consent matches procedure scheduled Relevant documents: relevant documents present and verified Test results: test results available and properly labeled Site marked: the operative site was marked Imaging studies: imaging studies available Required items: required blood products, implants, devices, and special equipment available Patient identity  confirmed: verbally with patient Time out: Immediately prior to procedure a "time out" was called to verify the correct patient, procedure, equipment, support staff and site/side marked as required. Preparation:  Patient was prepped and draped in the usual sterile fashion. Local anesthesia used: yes Anesthesia: digital block  Anesthesia: Local anesthesia used: yes Local Anesthetic: lidocaine 1% without epinephrine Anesthetic total: 5 mL  Sedation: Patient sedated: no  Patient tolerance: patient tolerated the procedure well with no immediate complications    (including critical care time)  Medications Ordered in ED Medications  lidocaine (PF) (XYLOCAINE) 1 % injection 10 mL (10 mLs Intradermal Given 05/24/19 1809)    ED Course  I have reviewed the triage vital signs and the nursing notes.  Pertinent labs & imaging results that were available during my care of the patient were reviewed by me and considered in my medical decision making (see chart for details).  74 yo female, right handed, presenting with dislocation of the right 5th digit with minimal fractures near base of middle and distal phalanx, no acute displacement seen.  No open fracture.  She has good sensation in her distal finger.  We discussed options to reduce the fracture and agreed upon local finger block.  This was successfully performed with 1% lidocaine, and the fracture was reduced.  She was placed in an ulnar gutter splint and given orthopedic hand surgeon's information for follow up in 1-2 weeks.    Final Clinical Impression(s) / ED Diagnoses Final diagnoses:  Dislocation of finger, initial encounter  Closed nondisplaced fracture of phalanx of right little finger, unspecified phalanx, initial encounter    Rx / DC Orders ED Discharge Orders    None       Wyvonnia Dusky, MD 05/25/19 1152

## 2019-05-24 NOTE — ED Triage Notes (Addendum)
Pt c/o fall with right 5th finger injury and deformity

## 2019-05-24 NOTE — ED Notes (Signed)
ED Provider at bedside. 

## 2019-05-24 NOTE — Discharge Instructions (Addendum)
Please follow up with Dr Caralyn Guile in 1 week in his office.  You should wear your splint at all times in the meantime.  Return to the ER if you have numbness or new discoloration in your finger.

## 2019-05-24 NOTE — ED Notes (Signed)
Patient transported to X-ray 

## 2019-05-26 ENCOUNTER — Other Ambulatory Visit: Payer: Medicare Other

## 2019-05-29 ENCOUNTER — Encounter (HOSPITAL_BASED_OUTPATIENT_CLINIC_OR_DEPARTMENT_OTHER): Payer: Self-pay | Admitting: *Deleted

## 2019-05-29 ENCOUNTER — Emergency Department (HOSPITAL_BASED_OUTPATIENT_CLINIC_OR_DEPARTMENT_OTHER)
Admission: EM | Admit: 2019-05-29 | Discharge: 2019-05-29 | Disposition: A | Discharge status: Home / Self Care | Payer: Medicare Other | Attending: Emergency Medicine | Admitting: Emergency Medicine

## 2019-05-29 ENCOUNTER — Other Ambulatory Visit (INDEPENDENT_AMBULATORY_CARE_PROVIDER_SITE_OTHER): Payer: Medicare Other

## 2019-05-29 ENCOUNTER — Other Ambulatory Visit: Payer: Self-pay

## 2019-05-29 ENCOUNTER — Emergency Department (HOSPITAL_BASED_OUTPATIENT_CLINIC_OR_DEPARTMENT_OTHER): Payer: Medicare Other

## 2019-05-29 ENCOUNTER — Telehealth: Payer: Self-pay | Admitting: Internal Medicine

## 2019-05-29 DIAGNOSIS — Z79899 Other long term (current) drug therapy: Secondary | ICD-10-CM | POA: Insufficient documentation

## 2019-05-29 DIAGNOSIS — Z86718 Personal history of other venous thrombosis and embolism: Secondary | ICD-10-CM | POA: Diagnosis not present

## 2019-05-29 DIAGNOSIS — S6991XD Unspecified injury of right wrist, hand and finger(s), subsequent encounter: Secondary | ICD-10-CM

## 2019-05-29 DIAGNOSIS — Z885 Allergy status to narcotic agent status: Secondary | ICD-10-CM | POA: Insufficient documentation

## 2019-05-29 DIAGNOSIS — E039 Hypothyroidism, unspecified: Secondary | ICD-10-CM | POA: Diagnosis not present

## 2019-05-29 DIAGNOSIS — Z888 Allergy status to other drugs, medicaments and biological substances status: Secondary | ICD-10-CM | POA: Insufficient documentation

## 2019-05-29 DIAGNOSIS — M7989 Other specified soft tissue disorders: Secondary | ICD-10-CM | POA: Diagnosis not present

## 2019-05-29 DIAGNOSIS — M79641 Pain in right hand: Secondary | ICD-10-CM | POA: Diagnosis not present

## 2019-05-29 DIAGNOSIS — Z7902 Long term (current) use of antithrombotics/antiplatelets: Secondary | ICD-10-CM | POA: Diagnosis not present

## 2019-05-29 DIAGNOSIS — Z887 Allergy status to serum and vaccine status: Secondary | ICD-10-CM | POA: Insufficient documentation

## 2019-05-29 DIAGNOSIS — Z8673 Personal history of transient ischemic attack (TIA), and cerebral infarction without residual deficits: Secondary | ICD-10-CM | POA: Diagnosis not present

## 2019-05-29 DIAGNOSIS — S60051A Contusion of right little finger without damage to nail, initial encounter: Secondary | ICD-10-CM | POA: Diagnosis not present

## 2019-05-29 DIAGNOSIS — W19XXXD Unspecified fall, subsequent encounter: Secondary | ICD-10-CM | POA: Insufficient documentation

## 2019-05-29 DIAGNOSIS — I10 Essential (primary) hypertension: Secondary | ICD-10-CM | POA: Diagnosis not present

## 2019-05-29 DIAGNOSIS — Z9861 Coronary angioplasty status: Secondary | ICD-10-CM | POA: Diagnosis not present

## 2019-05-29 NOTE — ED Triage Notes (Signed)
A week ago she fell and sustained a fracture to her right pinky finger. A splint was applied. She is here today because her right middle finger appears blue and she is not sure if her finger is bruised or blue from lack of circulation.

## 2019-05-29 NOTE — Discharge Instructions (Addendum)
You were seen today for re-evaluation of your hand after injury one week ago. Your xray showed no new findings or injuries to any other fingers. Please see orthopedics tomorrow as you are scheduled.

## 2019-05-29 NOTE — Telephone Encounter (Signed)
Spoke with the pt  She states for the past 6 months she has been having HA behind her left eye  She states her PCP says since she is on ofev, we could order a lab test called "NMO" She states her PCP does not have ability to order this test  Please advise thanks

## 2019-05-29 NOTE — ED Provider Notes (Signed)
[facility] EMERGENCY DEPARTMENT Provider Note   CSN: [id number] Arrival date & time: 05/29/19  1442     History Chief Complaint  Patient presents with  . Splint Check    Alexandria [name] is a 74 y.o. female.  74 y/o F with below PMH presenting to the ER for evaluation of R middle finger pain and swelling. Patient suffered a hand injury one week ago after falling. Was evaluated in ER with xray of the little finger showing dislocation and fracture which was reduced here in ED. She has f/u with ortho tomorrow. She reports she has now noticed increased pain, swelling and bruising to the middle finger which was not imaged at the time of the fall. She reports she did loosen the splint around her thumb but otherwise has no numbness, tingling or increased pain in her pinky or ring finger         Past Medical History:  Diagnosis Date  . Arthritis   . Asthmatic bronchitis with acute exacerbation 02/06/2015  . Bronchial pneumonia   . Colon polyp   . Diverticulitis   . DVT (deep venous thrombosis) ([place])    lower extremity  . H/O cardiac catheterization 2004   Normal coronary arteries  . Heart murmur   . History of stress test 05/21/2011  . Hx of echocardiogram 01/27/2010   Normal Ef 55% the transmitral spectral doppler flow pattern is normal for age. the left ventricular wall motion is normal  . IPF (idiopathic pulmonary fibrosis) ([place]) 01/2018  . Migraines   . Pancreatitis   . Thyroid disease   . TIA (transient ischemic attack)    8 years ago    Patient Active Problem List   Diagnosis Date Noted  . Pain of left calf 05/09/2019  . Tibial pain 05/09/2019  . Essential hypertension 02/08/2019  . Healthcare maintenance 11/10/2018  . Bradycardia 07/22/2018  . Pansinusitis 06/23/2018  . History of transient ischemic attack (TIA) 03/24/2018  . IPF (idiopathic pulmonary fibrosis) ([place]) 03/24/2018  . Allergic rhinitis 02/16/2018  . Eustachian tube dysfunction,  bilateral 02/16/2018  . URI (upper respiratory infection) 02/16/2018  . ILD (interstitial lung disease) ([place]) 01/07/2018  . Dyspnea 01/07/2018  . Acute respiratory failure ([place]) 01/06/2018  . TIA (transient ischemic attack) 01/06/2018  . Hypokalemia 12/15/2017  . Chronic cough 11/26/2017  . Basal cell carcinoma (BCC) of neck 06/07/2017  . Cardiac murmur 05/07/2017  . Diastolic dysfunction 0000000  . Situational anxiety 05/07/2017  . Headache 05/07/2017  . Cellulitis of left lower extremity 04/30/2017  . Migraine without status migrainosus, not intractable 06/09/2016  . Insomnia 11/18/2015  . Chest pain at rest 05/20/2015  . RBBB 05/20/2015  . Abnormal CXR 05/20/2015  . Hx of myocardial infarction 05/08/2015  . Bruising 05/08/2015  . Upper airway cough syndrome 02/14/2015  . Asthmatic bronchitis with acute exacerbation 02/06/2015  . Hypothyroidism 10/25/2014  . Fatigue 10/25/2014  . 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 Chronic venous insufficiency 10/24/2014  . Varicose veins of leg with complications [id number]  . Degeneration of intervertebral disc of cervical region 06/29/2014  . Palpitations 11/08/2013  . SOB (shortness of breath) 10/03/2013  . DOE (dyspnea on exertion) 10/03/2013  . Edema of both legs 10/03/2013  . Hair loss 11/25/2012  . Diverticulitis 07/14/2012  . Hammer toe 06/13/2012  . Recurrent abdominal pain 04/17/2012  . Hypoglycemia 01/06/2012    Past Surgical History:  Procedure Laterality Date  . BREAST BIOPSY     Isaiah Blakes  . CARDIAC  CATHETERIZATION     10/2013  . CATARACT EXTRACTION Right 03/22/2018  . LEFT HEART CATHETERIZATION WITH CORONARY ANGIOGRAM N/A 10/25/2013   Procedure: LEFT HEART CATHETERIZATION WITH CORONARY ANGIOGRAM;  Surgeon: Troy Sine, MD;  Location: [facility] CATH LAB;  Service: Cardiovascular;  Laterality: N/A;  . NECK SURGERY     x's 2  . TOTAL KNEE ARTHROPLASTY     Bilateral x's 2  . VAGINAL HYSTERECTOMY  10/17/1998   [facility] BRONCHOSCOPY Bilateral 01/24/2018   Procedure: VIDEO BRONCHOSCOPY WITH FLUORO;  Surgeon: [name], MD;  Location: [facility];  Service: Cardiopulmonary;  Laterality: Bilateral;     OB History   No obstetric history on file.     Family History  Problem Relation Age of Onset  . Arthritis Other   . Colon cancer Father   . Prostate cancer Father   . HIV Brother        42  . Kidney cancer Brother   . Colon cancer Brother   . Lung cancer Brother   . Other Brother        Mouth Cancer  . Ovarian cancer Mother   . Uterine cancer Mother   . Heart disease Maternal Grandmother   . Stroke Maternal Grandmother   . Hypertension Maternal Grandmother   . Diabetes Maternal Grandmother     Social History   Tobacco Use  . Smoking status: Former Smoker    Years: 2.00    Quit date: 04/06/1969    Years since quitting: 50.1  . Smokeless tobacco: Never Used  . Tobacco comment: socially smoked x 2 years  Substance Use Topics  . Alcohol use: No    Alcohol/week: 0.0 standard drinks  . Drug use: No    Home Medications Prior to Admission medications   Medication Sig Start Date End Date Taking? Authorizing Provider  benzonatate (TESSALON) 100 MG capsule TAKE 1 CAPSULE BY MOUTH 3 TIMES A DAY AS NEEDED FOR COUGH 08/31/18   Magdalen Spatz, NP  Biotin (RA BIOTIN) 1000 MCG tablet Take 1,000 mcg by mouth 2 (two) times daily.     [provider]  clopidogrel (PLAVIX) 75 MG tablet TAKE 1 TABLET BY MOUTH EVERY DAY 11/16/18   Carollee Herter, Alferd Apa, DO  dicyclomine (BENTYL) 20 MG tablet TAKE 1 TABLET BY MOUTH FOUR TIMES A DAY AS NEEDED 04/11/19   Carollee Herter, Alferd Apa, DO  furosemide (LASIX) 40 MG tablet Take 1 tablet (40 mg total) by mouth 2 (two) times daily. Patient taking differently: Take 40 mg by mouth daily. If needed twice a day 12/23/18   Carollee Herter, Yvonne R, DO  KLOR-CON M20 20 MEQ tablet Take 1 tablet (20 mEq total) by mouth 2 (two) times daily. Will take 3 a daily  only as needed. 03/23/19   Roma Schanz R, DO  metoprolol succinate (TOPROL-XL) 25 MG 24 hr tablet TAKE 1 TABLET BY MOUTH EVERY DAY 03/27/19   Troy Sine, MD  Multiple Vitamin (MULTIVITAMIN) tablet Take 1 tablet by mouth every other day. Gluten free/Vegetarian Multivitamin    [provider]  Nintedanib (OFEV) 150 MG CAPS Take 1 capsule (150 mg total) by mouth 2 (two) times daily. 12/20/18   Brand Males, MD  SYNTHROID 112 MCG tablet TAKE 1 TABLET BY MOUTH EVERY DAY BEFORE BREAKFAST 03/29/19   Carollee Herter, Alferd Apa, DO  vitamin C (ASCORBIC ACID) 500 MG tablet Take 1,000 mg by mouth 2 (two) times daily.  [provider]  Vitamin D, Ergocalciferol, (DRISDOL) 1.25 MG (50000 UNIT) CAPS capsule Take 1 capsule (50,000 Units total) by mouth every 7 (seven) days. Take for 8 total doses(weeks) 05/10/19   Rosemarie Ax, MD  vitamin E 1000 UNIT capsule Take 1,000 Units by mouth daily.    [provider]    Allergies    Prednisone, Atrovent hfa [ipratropium bromide hfa], Cheratussin ac [guaifenesin-codeine], Codeine, Hydrocodone, Influenza vaccines, Motrin [ibuprofen], and Oxycodone-acetaminophen  Review of Systems   Review of Systems  Constitutional: Negative for chills and fever.  Musculoskeletal: Positive for arthralgias and joint swelling.  Skin: Positive for color change.  Neurological: Negative for numbness.    Physical Exam Updated Vital Signs BP (!) 163/93   Pulse 77   Temp 98 F (36.7 C) (Oral)   Resp 18   Ht 5\' 3"  (1.6 m)   Wt 61.2 kg   SpO2 97%   BMI 23.91 kg/m   Physical Exam Vitals and nursing note reviewed.  Constitutional:      General: She is not in acute distress.    Appearance: Normal appearance. She is not ill-appearing, toxic-appearing or diaphoretic.  HENT:     Head: Normocephalic.  Eyes:     Conjunctiva/sclera: Conjunctivae normal.  Pulmonary:     Effort: Pulmonary effort is normal.  Skin:    General: Skin is dry.      Comments: Right middle finger is swollen, tender and with ecchymosis at the PIP of the right middle finger. Ulna gutter splint is in tact and sensation of distal fingers is in tact. Fingers are warm and well perfused.   Neurological:     Mental Status: She is alert.  Psychiatric:        Mood and Affect: Mood normal.     ED Results / Procedures / Treatments   Labs (all labs ordered are listed, but only abnormal results are displayed) Labs Reviewed - No data to display  EKG None  Radiology DG Hand Complete Right  Result Date: 05/29/2019 CLINICAL DATA:  previous injury to hand one week ago but only little finger was imaged. Having middle finger pain,swelling and bruising and wants assessed. EXAM: RIGHT HAND - COMPLETE 3+ VIEW COMPARISON:  Little finger radiograph 05/24/2019 FINDINGS: Overlying splint material in place limits osseous and soft tissue fine detail. Previous fifth digit dislocation at the proximal interphalangeal joint has been reduced. Small fracture fragment on prior exam is not well visualized due to overlying splint material. No evidence of acute fracture of the middle finger. No dislocation. 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 IMPRESSION: 1. No evidence of acute fracture or dislocation of the middle finger. The oblique and lateral views partially obscured by overlying splint material. 2. Normal alignment of the fifth digit after previous subluxation, previous small fracture fragments on prior exam not well visualized. Electronically Signed   By: Keith Rake M.D.   On: 05/29/2019 16:17    Procedures Procedures (including critical care time)  Medications Ordered in ED Medications - No data to display  ED Course  I have reviewed the triage vital signs and the nursing notes.  Pertinent labs & imaging results that were available during my care of the patient were reviewed by me and considered in my medical decision making (see chart for  details).  Clinical Course as of May 28 1626  Mon May 29, 2019  1539 74 y/o F here today for re-evaluation of her hand after injury one week  ago. At initial visit little finger dislocation was reduced and splinted. Here today because middle finger has more swelling and pain and was not imaged at initial visit. On exam she is neurovascularly intact without signs of compartment syndrome. Middle finger does appears bruised, swollen and is tender. Will obtain further imaging of the hand.    [KM]  [id number] X-ray today without evidence of any acute findings and also showing little finger is still reduced since last week.  Patient has follow-up with orthopedics tomorrow.  Advised to continue with splint, rest, ice, elevation and over-the-counter anti-inflammatory medications.   [KM]    Clinical Course User Index [KM] Kristine Royal   MDM Rules/Calculators/A&P                      Based on review of vitals, medical screening exam, lab work and/or imaging, there does not appear to be an acute, emergent etiology for the patient's symptoms. Counseled pt on good return precautions and encouraged both PCP and ED follow-up as needed.  Prior to discharge, I also discussed incidental imaging findings with patient in detail and advised appropriate, recommended follow-up in detail.  Clinical Impression: 1. Hand injury, right, subsequent encounter     Disposition: Discharge  Prior to providing a prescription for a controlled substance, I independently reviewed the patient's recent prescription history on the [facility]. The patient had no recent or regular prescriptions and was deemed appropriate for a brief, less than 3 day prescription of narcotic for acute analgesia.  This note was prepared with assistance of Systems analyst. Occasional wrong-word or sound-a-like substitutions may have occurred due to the inherent limitations of voice  recognition software.  Final Clinical Impression(s) / ED Diagnoses Final diagnoses:  Hand injury, right, subsequent encounter    Rx / DC Orders ED Discharge Orders    None       Kristine Royal 05/29/19 1629    Maudie Flakes, MD 05/30/19 [phone]

## 2019-05-30 DIAGNOSIS — S63286A Dislocation of proximal interphalangeal joint of right little finger, initial encounter: Secondary | ICD-10-CM | POA: Diagnosis not present

## 2019-05-30 DIAGNOSIS — S6991XA Unspecified injury of right wrist, hand and finger(s), initial encounter: Secondary | ICD-10-CM | POA: Diagnosis not present

## 2019-05-30 LAB — COMPREHENSIVE METABOLIC PANEL
ALT: 19 U/L (ref 0–35)
AST: 22 U/L (ref 0–37)
Albumin: 4.1 g/dL (ref 3.5–5.2)
Alkaline Phosphatase: 65 U/L (ref 39–117)
BUN: 14 mg/dL (ref 6–23)
CO2: 32 mEq/L (ref 19–32)
Calcium: 9.1 mg/dL (ref 8.4–10.5)
Chloride: 99 mEq/L (ref 96–112)
Creatinine, Ser: 0.84 mg/dL (ref 0.40–1.20)
GFR: 66.2 mL/min (ref >60.00)
Glucose, Bld: 96 mg/dL (ref 70–99)
Potassium: 3.4 mEq/L — ABNORMAL LOW (ref 3.5–5.1)
Sodium: 139 mEq/L (ref 135–145)
Total Bilirubin: 0.6 mg/dL (ref 0.2–1.2)
Total Protein: 6.5 g/dL (ref 6.0–8.3)

## 2019-05-31 NOTE — Telephone Encounter (Signed)
The advice left on her VM by PCP [name], DO office on 05/22/2019 was for her to call the eye doctor. This issue is unrelated to ofev. If she feels is related to ofev she can do  A holiday of ofev from 05/31/2019 till she sees me on 06/14/19

## 2019-05-31 NOTE — Telephone Encounter (Signed)
Pt calling back from earlier message.  [phone].  Please advise

## 2019-05-31 NOTE — Telephone Encounter (Signed)
Spoke with the pt and notified of response per MR  She states will keep appt with MR for 06/14/19 and discuss more then Nothing further needed at this time

## 2019-06-01 ENCOUNTER — Telehealth: Payer: Self-pay

## 2019-06-01 NOTE — Telephone Encounter (Signed)
Patient called in need Dr. Etter Sjogren or the nurse to give her a call back to Discuss her Lab results please give her a call back as soon as possible at [phone]   Thank,

## 2019-06-01 NOTE — Progress Notes (Signed)
Stable v mild decline in PFT in 1 year. Will discuss duing march visit

## 2019-06-03 ENCOUNTER — Other Ambulatory Visit: Payer: Self-pay | Admitting: Family Medicine

## 2019-06-09 DIAGNOSIS — M79644 Pain in right finger(s): Secondary | ICD-10-CM | POA: Diagnosis not present

## 2019-06-09 DIAGNOSIS — S63256A Unspecified dislocation of right little finger, initial encounter: Secondary | ICD-10-CM | POA: Diagnosis not present

## 2019-06-14 ENCOUNTER — Ambulatory Visit (INDEPENDENT_AMBULATORY_CARE_PROVIDER_SITE_OTHER): Payer: Medicare Other | Admitting: Internal Medicine

## 2019-06-14 ENCOUNTER — Other Ambulatory Visit: Payer: Self-pay

## 2019-06-14 ENCOUNTER — Encounter: Payer: Self-pay | Admitting: Internal Medicine

## 2019-06-14 VITALS — BP 136/86 | HR 83 | Ht 63.0 in | Wt 138.6 lb

## 2019-06-14 DIAGNOSIS — J84112 Idiopathic pulmonary fibrosis: Secondary | ICD-10-CM | POA: Diagnosis not present

## 2019-06-14 DIAGNOSIS — R110 Nausea: Secondary | ICD-10-CM

## 2019-06-14 DIAGNOSIS — R109 Unspecified abdominal pain: Secondary | ICD-10-CM

## 2019-06-14 NOTE — Addendum Note (Signed)
Addended by: Lorretta Harp on: 06/14/2019 12:20 PM   Modules accepted: Orders

## 2019-06-14 NOTE — Progress Notes (Signed)
Chief Complaint  Patient presents with  . Results    Discuss results of PFT - ILD (Interstitial lung disease)    Referring provider: [name], *  HPI:  74 year old female former smoker followed in office for idiopathic pulmonary fibrosis recently started on 0FEV  PMH: Hypothyroidism, MI Smoker/ Smoking History: Former Smoker. 4 pack years.  Maintenance:  OFEV, [place] 100 Pt of: [place]    Synopsis: Former patient of Dr. Melvyn Novas with UACS and asthma who was found on chest imaging to have evidence of diffuse parenchymal lung disease.  She has a history of acid reflux and intolerance to antacids.  She treats her reflux with papaya. She says that she has had recurrent episodes of bronchitis on an annual basis for many years.  11/10/2018  - Visit   74 year old female former smoker followed in our office for IPF.  Patient is maintained on Ofev.  Patient is completing a visit today after completing pulmonary function test.  Pulmonary function test results are listed below:  11/10/2018-pulmonary function test-FVC 2.17 (75% predicted), postbronchodilator ratio 92, postbronchodilator FEV1 1.99 (91% predicted), no bronchodilator response, mid flow reversibility, DLCO 86  Patient is also followed by [facility] for management of her interstitial lung disease.  Patient is also been seen by Dr. Winn Jock at [facility] in [place].  [facility] assessment agreed that [facility] and our work-up is sufficient and to proceed forward with [facility]'s recommendations.  Patient reports that her breathing has been stable she has been walking every day.  She walked 2 miles yesterday including going up hills.  She is not having any issues at this time.    Tests:   Chest imaging: September 2019 high-resolution CT scan of the chest considered probable UIP, differential diagnosis includes nonspecific interstitial pneumonitis.  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  PFT: September 2019 lung function testing ratio normal, FEV1 1.9 L 86% predicted, FVC 2.20 L 76% predicted, total lung capacity 3.95 L 78% predicted, DLCO 16.1 mL 66% predicted  11/10/2018-pulmonary function test-FVC 2.17 (75% predicted), postbronchodilator ratio 92, postbronchodilator FEV1 1.99 (91% predicted), no bronchodilator response, mid flow reversibility, DLCO 86  Labs: - Allergy profile 11/30/2016 >  Eos 0.3 /  IgE   slightly elevated at 129, RAST profile negative otherwise December 31, 2017 hypersensitivity pneumonitis panel negative, anti-Jo 1-, CCP negative, rheumatoid factor negative, SSA negative, SSB negative, SCL 70-.  ANA negative 01/2018 CBC with diff 400 eosinophils, ANCA test negative October 2019 bronchoscopy: Minimal white blood cells on cell count and differential, cytology showed minimal inflammatory change, just typical macrophages.  No malignant change.  Cultures negative  6 Min walk 06/2018 556m, O2 saturation 96% RA  Path:  Echo:  Heart Catheterization:  SIX MIN WALK 11/10/2018 06/14/2018  Medications - Synthroid 149mcg @ 0730, OFEV 150mg  @ 0745, Papaya @ 0800 and 0830, Tessalon 100mg  @ 0815, Plavix 75mg  @ 0830  Supplimental Oxygen during Test? (L/min) No No  Laps - 15  Partial Lap (in Meters) - 20  Baseline BP (sitting) - 134/84  Baseline Heartrate - 67  Baseline Dyspnea (Borg Scale) - 0  Baseline Fatigue (Borg Scale) - 1  Baseline  SPO2 - 97  BP (sitting) - 150/86  Heartrate - 45  Dyspnea (Borg Scale) - 0  Fatigue (Borg Scale) - 0.5  SPO2 - 96  BP (sitting) - 138/80  Heartrate - 75  SPO2 - 96  Stopped or Paused before Six  Minutes - No  Distance Completed - 530  Tech Comments: The patient was able to maintain a steady brisk walking pace and did not have to stop at any time. patient completed test at brisk pace, no rest breaks.  patient denied any symptoms of dyspnea, angina, dizzines, hip/leg/calf pain at end of test    OV 12/20/2018  Subjective:  Patient ID: Alexandria [name], female , DOB: 07-Jun-1945 , age 89 y.o. , MRN: [id number] , ADDR[address]: [address] [place] [id number]   12/22/2018 -   Chief Complaint  Patient presents with  . IPF (idiopathic pulmonary fibrosis)      HPI Alexandria [name] 74 y.o. -transfer of care from Dr. Leilani Merl to Dr. Chase Caller in the ILD clinic.  This is because Dr. 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  Patient tells me that she is been given a diagnosis of IPF.  This is a clinical diagnosis without biopsy.  This is been supported both at [facility] and [facility].  I reviewed Dr. Lauris Chroman notes from end of 2019 and his diagnosis of IPF based on clinical grounds.  However she tells me that he did raise the possibility of non--IPF interstitial lung disease but I do not see this in the documentation.  She tells me since the diagnosis and starting nintedanib she is actually stable and is actually feeling better because of her intense exercise regimen.  She does have some difficulty tolerating nintedanib and 2 out of the 7 days because of abdominal pain she has only take 1 pill a day.  She relieves this abdominal pain with papaya.  She specifically wants to know from me if I support the diagnosis of IPF or if there alternate diagnosis I need to consider In addition she wanted to know about the role of nintedanib and fibrosis progression prevention.  However she is not interested in lung biopsy at this point.    [facility] Integrated Comprehensive ILD Questionnaire  Symptoms:  -Insidious onset of  shortness of breath since June 2019 she states it is getting better because of extreme physical conditioning and working out.  No episodic dyspnea.  In fact currently she rates 0 dyspnea for rest or taking a shower or doing household work or shopping or walking at level at her own pace speaking.  No dyspnea for walking at level with others of age.  No dyspnea for walking up stairs or hill.  No other conditions are limiting her activities.  She did have a cough starting in June 2019 but it is better it is mild.  It is dry in quality occasionally clears her throat.  It is associated with allergies.  No other associated symptoms no wheezing.   Past Medical History : Positive for heart attack in December 2004.  Positive for thyroid disease.  Positive for stroke in October 2007.  Otherwise negative for asthma or COPD or rheumatoid arthritis or scleroderma or lupus or polymyositis or acid reflux or Sjogren's disease or sleep apnea or HIV or pulmonary hypertension or diabetes.  Negative for seizures tuberculosis kidney disease.  Negative for blood clots.  Negative for pleurisy  ROS:  -Positive for knee stiffness following knee replacement.  Sometimes she has dry eyes but otherwise no weight loss or fatigue color change in her fingers of fever or nausea vomiting or heartburn or rash or ulcers   FAMILY HISTORY of LUNG DISEASE: Positive for COPD but otherwise negative for fibrosis or asthma sarcoid   EXPOSURE HISTORY: She smokes cigarettes but quit in September 1971.  She does not smoke cigars or pipes or does any vaping.  No electronic cigarettes.  No marijuana.  No cocaine no intravenous drug use   HOME and HOBBY DETAILS : [place]..  Denies any damp living environment.  Denies mold or mildew in the shower curtain.  No humidifier use no CPAP use.  No nebulizer use.  No steam iron use no Jacuzzi use.  No Misty Fountain in the house.  She only has a cat for 19 years.   No pet gerbils.  No feather pillow use no feather duvet use.  No mold in the [facility] duct.  Does not play any musical instruments.  Does gardening   OCCUPATIONAL HISTORY (122 questions) : Essentially negative except gardening   PULMONARY TOXICITY HISTORY (27 items):  -Also negative.    CT chest high-resolution December 30, 2017 approximately 1 year ago: Personally visualized and agree with the suspicion of probable UIP  She has now joined a support group   Results for ROSALI, AUGELLO (MRN [id number]) as of 12/20/2018 12:14  Ref. Range 11/30/2016 11:22 12/31/2017 14:24  Anti Nuclear Antibody (ANA) Latest Ref Range: NEGATIVE   NEGATIVE  Anti JO-1 Latest Ref Range: 0.0 - 0.9 AI  <9.2  Cyclic Citrullin Peptide Ab Latest Units: UNITS  <16  RA Latex Turbid. Latest Ref Range: <14 IU/mL  <14  IgE (Immunoglobulin E), Serum Latest Ref Range: <115 kU/L 129 (H)   SSA (Ro) (ENA) Antibody, IgG Latest Ref Range: <1.0 NEG AI  <1.0 NEG  SSB (La) (ENA) Antibody, IgG Latest Ref Range: <1.0 NEG AI  <1.0 NEG  Scleroderma (Scl-70) (ENA) Antibody, IgG Latest Ref Range: <1.0 NEG AI  <1.0 NEG   ROS - per HPI  OV 06/14/2019  Subjective:  Patient ID: Alexandria [name], female , DOB: 1945-05-30 , age 3 y.o. , MRN: [id number] , ADDRESS: [address] [id number]   06/14/2019 -   Chief Complaint  Patient presents with  . Follow-up    Pt states she has been doing good since last visit. Pt denies any complaints of cough or CP.    Follow-up clinical diagnosis of IPF based on negative serology is greater than 65, negative history and also probable UIP on CT chest. HPI Alexandria [name] 74 y.o. -presents for follow-up last visit was in September 2020.  Since then overall stable.  She continues to walk a few miles a day.  Keeps herself active.  In the interim on Ash Wednesday she was at church and somebody bumped into her and she is fractured her right little finger.  Other than that she is  doing well.  She tolerates her nintedanib which has been taking since end of 2019 quite well.  She says after the night dose maybe 2 or 3 times a week she gets some abdominal pain that is mild and also some mild nausea that is sometimes moderate.  She lies down and it gets better.  She says she will try ginger capsules or ginger food in the past future  for this and see if this would help her.  There is no change in effort tolerance.  Symptom score is 0 except for nausea.  High-resolution CT scan of the chest shows no change compared to 2019.  Her FVC and walk test are stable.  There is a decline in the DLCO but she thinks is a technique issue from when she did in February 2021.  She had liver function test with her primary care physician February and this was normal.  She does not want to do the COVID-19 vaccine.  She is worried because she has had reaction to the flu shot which was never determined if it was flu shot or something else.  Her husband does have the COVID-19 vaccine.  SYMPTOM SCALE - ILD 06/14/2019   O2 use nond  Shortness of Breath 0 -> 5 scale with 5 being worst (score 6 If unable to do)  At rest 0  Simple tasks - showers, clothes change, eating, shaving 0  Household (dishes, doing bed, laundry) 0  Shopping 0  Walking level at own pace 0  Walking up Stairs 0  Total (30-36) Dyspnea Score 00  How bad is your cough? 0  How bad is your fatigue 0  How bad is nausea 0-2  How bad is vomiting?  00  How bad is diarrhea? 0  How bad is anxiety? 00  How bad is depression 0        Simple office walk 185 feet x  3 laps goal with forehead probe 06/14/2019   O2 used ra  Number laps completed 3  Comments about pace fast  Resting Pulse Ox/HR 100% and 83/min  Final Pulse Ox/HR 98% and 110/min  Desaturated </= 88% no  Desaturated <= 3% points no  Got Tachycardic >/= 90/min yes  Symptoms at end of test none  Miscellaneous comments z   Results for BETTYANNE, DITTMAN (MRN  [id number]) as of 06/14/2019 11:46  Ref. Range 02/07/2019 12:18  Hemoglobin Latest Ref Range: 12.0 - 15.0 g/dL 13.4   Results for DEENA, SHAUB (MRN [id number]) as of 06/14/2019 11:46  Ref. Range 05/29/2019 14:28  AST Latest Ref Range: 0 - 37 U/L 22  ALT Latest Ref Range: 0 - 35 U/L 19     Results for ROSHANA, SHUFFIELD (MRN [id number]) as of 06/14/2019 11:46  Ref. Range 12/30/2017 09:02 11/10/2018 08:38 05/10/2019 10:46  FVC-Pre Latest Units: L 2.10 2.17 2.15  FVC-%Pred-Pre Latest Units: % 72 75 75  Results for JANE, BIRKEL (MRN [id number]) as of 06/14/2019 11:46  Ref. Range 12/30/2017 09:02 11/10/2018 08:38 05/10/2019 10:46  DLCO unc Latest Units: ml/min/mmHg 16.06 16.60 13.66  DLCO unc % pred Latest Units: % 66 86 71    EXAM: CT CHEST WITHOUT CONTRAST  TECHNIQUE: Multidetector CT imaging of the chest was performed following the standard protocol without intravenous contrast. High resolution imaging of the lungs, as well as inspiratory and expiratory imaging, was performed.  COMPARISON:  12/30/2017 high-resolution chest CT.  FINDINGS: Cardiovascular: Top-normal heart size. No significant pericardial effusion/thickening. Atherosclerotic thoracic aorta with stable ectatic 4.1 cm ascending thoracic aorta. Dilated main pulmonary artery (3.6 cm diameter).  Mediastinum/Nodes: Thyroid is either atrophic or surgically absent. Small amount of fluid in the mid to lower thoracic esophagus. No pathologically enlarged axillary, mediastinal or hilar lymph nodes, noting limited sensitivity for the detection of hilar adenopathy on this noncontrast study.  Lungs/Pleura: No pneumothorax. No pleural effusion. No acute consolidative airspace disease or lung  masses. Stable scattered tiny calcified granulomas in both lungs. A few scattered small solid pulmonary nodules in the right lung, largest 4 mm in the peripheral apical right upper lobe (series 3/image 34), all stable,  considered benign. No new significant pulmonary nodules. Moderate patchy confluent subpleural reticulation and ground-glass opacity throughout both lungs with associated minimal traction bronchiolectasis and architectural distortion. Slight basilar predominance to these findings. No frank honeycombing. No significant air trapping on the limited expiration sequence. No evidence of tracheobronchomalacia. No appreciable interval progression.  Upper abdomen: No acute abnormality.  Musculoskeletal: No aggressive appearing focal osseous lesions. Surgical hardware from ACDF in the lower cervical spine. Moderate to marked thoracic spondylosis.  IMPRESSION: 1. 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 No frank honeycombing. No appreciable interval disease progression. Differential remains fibrotic phase nonspecific interstitial pneumonia (NSIP) or usual interstitial pneumonia (UIP). Findings are categorized as probable UIP per consensus guidelines: Diagnosis of Idiopathic Pulmonary Fibrosis: An Official ATS/ERS/JRS/ALAT Clinical Practice Guideline. [place], Iss 5, [phone], Dec 05 2016. 2. Dilated main pulmonary artery, suggesting pulmonary arterial hypertension. 3. Stable ectatic 4.1 cm ascending thoracic aorta. Recommend annual imaging followup by CTA or MRA. This recommendation follows 2010 ACCF/AHA/AATS/ACR/ASA/SCA/SCAI/SIR/STS/SVM Guidelines for the Diagnosis and Management of Patients with Thoracic Aortic Disease. Circulation. 2010; [id number]. Aortic aneurysm NOS (ICD10-I71.9).  Aortic Atherosclerosis (ICD10-I70.0).   Electronically Signed   By: Ilona Sorrel M.D.   On: 03/07/2019 10:29  ROS - per HPI     has a past medical history of Arthritis, Asthmatic bronchitis with acute exacerbation (02/06/2015), Bronchial pneumonia, Colon polyp, Diverticulitis, DVT (deep venous  thrombosis) ([place]), H/O cardiac catheterization (2004), Heart murmur, History of stress test (05/21/2011), echocardiogram (01/27/2010), IPF (idiopathic pulmonary fibrosis) ([place]) (01/2018), Migraines, Pancreatitis, Thyroid disease, and TIA (transient ischemic attack).   reports that she quit smoking about 50 years ago. She quit after 2.00 years of use. She has never used smokeless tobacco.  Past Surgical History:  Procedure Laterality Date  . BREAST BIOPSY     [facility]  . CARDIAC CATHETERIZATION     10/2013  . CATARACT EXTRACTION Right 03/22/2018  . LEFT HEART CATHETERIZATION WITH CORONARY ANGIOGRAM N/A 10/25/2013   Procedure: LEFT HEART CATHETERIZATION WITH CORONARY ANGIOGRAM;  Surgeon: Troy Sine, MD;  Location: [facility] CATH LAB;  Service: Cardiovascular;  Laterality: N/A;  . NECK SURGERY     x's 2  . TOTAL KNEE ARTHROPLASTY     Bilateral x's 2  . VAGINAL HYSTERECTOMY  10/17/1998   [place] BRONCHOSCOPY Bilateral 01/24/2018   Procedure: VIDEO BRONCHOSCOPY WITH FLUORO;  Surgeon: [name], MD;  Location: [place];  Service: Cardiopulmonary;  Laterality: Bilateral;    Allergies  Allergen Reactions  . Prednisone Other (See Comments)    Changed personality   . Atrovent Hfa [Ipratropium Bromide Hfa] Other (See Comments)    Pt could not sleep  . Cheratussin Ac [Guaifenesin-Codeine]   . Codeine Other (See Comments)    Pt takes promethazine with codeine at home  . Hydrocodone Itching  . Influenza Vaccines Other (See Comments)    Pt reports heart attack after last flu shot  . Motrin [Ibuprofen] Other (See Comments)    "Gives false reading in blood"  . Oxycodone-Acetaminophen Itching    Immunization History  Administered Date(s) Administered  . Td 04/06/2013  . Tdap 04/06/1998    Family History  Problem Relation Age of Onset  .  Arthritis Other   . Colon cancer Father   . Prostate cancer Father   . HIV Brother        52  . Kidney cancer Brother   . Colon  cancer Brother   . Lung cancer Brother   . Other Brother        Mouth Cancer  . Ovarian cancer Mother   . Uterine cancer Mother   . Heart disease Maternal Grandmother   . Stroke Maternal Grandmother   . Hypertension Maternal Grandmother   . Diabetes Maternal Grandmother      Current Outpatient Medications:  .  benzonatate (TESSALON) 100 MG capsule, TAKE 1 CAPSULE BY MOUTH 3 TIMES A DAY AS NEEDED FOR COUGH, Disp: 90 capsule, Rfl: 1 .  Biotin (RA BIOTIN) 1000 MCG tablet, Take 1,000 mcg by mouth 2 (two) times daily. , Disp: , Rfl:  .  clopidogrel (PLAVIX) 75 MG tablet, TAKE 1 TABLET BY MOUTH EVERY DAY, Disp: 90 tablet, Rfl: 1 .  dicyclomine (BENTYL) 20 MG tablet, TAKE 1 TABLET BY MOUTH FOUR TIMES A DAY AS NEEDED, Disp: 360 tablet, Rfl: 1 .  furosemide (LASIX) 40 MG tablet, Take 1 tablet (40 mg total) by mouth 2 (two) times daily. (Patient taking differently: Take 40 mg by mouth daily. If needed twice a day), Disp: 180 tablet, Rfl: 1 .  KLOR-CON M20 20 MEQ tablet, Take 1 tablet (20 mEq total) by mouth 2 (two) times daily. Will take 3 a daily only as needed., Disp: 180 tablet, Rfl: 1 .  metoprolol succinate (TOPROL-XL) 25 MG 24 hr tablet, TAKE 1 TABLET BY MOUTH EVERY DAY, Disp: 90 tablet, Rfl: 1 .  Multiple Vitamin (MULTIVITAMIN) tablet, Take 1 tablet by mouth every other day. Gluten free/Vegetarian Multivitamin, Disp: , Rfl:  .  Nintedanib (OFEV) 150 MG CAPS, Take 1 capsule (150 mg total) by mouth 2 (two) times daily., Disp: 60 capsule, Rfl: 5 .  SYNTHROID 112 MCG tablet, TAKE 1 TABLET BY MOUTH EVERY DAY BEFORE BREAKFAST, Disp: 90 tablet, Rfl: 1 .  vitamin C (ASCORBIC ACID) 500 MG tablet, Take 1,000 mg by mouth 2 (two) times daily. , Disp: , Rfl:  .  Vitamin D, Ergocalciferol, (DRISDOL) 1.25 MG (50000 UNIT) CAPS capsule, Take 1 capsule (50,000 Units total) by mouth every 7 (seven) days. Take for 8 total doses(weeks), Disp: 8 capsule, Rfl: 0 .  vitamin E 1000 UNIT capsule, Take 1,000 Units by  mouth daily., Disp: , Rfl:       Objective:   Vitals:   06/14/19 1118  BP: 136/86  Pulse: 83  SpO2: 100%  Weight: 138 lb 9.6 oz (62.9 kg)  Height: 5\' 3"  (1.6 m)    Estimated body mass index is 24.55 kg/m as calculated from the following:   Height as of this encounter: 5\' 3"  (1.6 m).   Weight as of this encounter: 138 lb 9.6 oz (62.9 kg).  @WEIGHTCHANGE @  Filed Weights   06/14/19 1118  Weight: 138 lb 9.6 oz (62.9 kg)     Physical Exam Pleasant oriented female.  Bilateral bibasal crackles present.  Otherwise no focal deformities alert and oriented x3.  No cyanosis no clubbing no edema.         Assessment:       ICD-10-CM   1. Idiopathic pulmonary fibrosis ([place])  J84.112   2. Nausea  R11.0   3. Abdominal pain, unspecified abdominal location  R10.9        Plan:  Patient Instructions     ICD-10-CM   1. Idiopathic pulmonary fibrosis ([place])  J84.112   2. Nausea  R11.0   3. Abdominal pain, unspecified abdominal location  R10.9      Clinically stable with symptoms, walk test, CT chest and FVC. Mild decline in dlco part of breathing test between Aug 2020 -> feb 2021 could be "technical)  Mild intermittent nausea and abdominal pain at night due to ofev - currently self managing  LFT normal feb 2021   pLAN - continue ofev 150mg  twice daily  - try ginger either food or capsule for night nausea  - if bad, call us and we can do zofran  - next LFT check in 3 months  -  At this point we do not have to entertain lung biopsy but we can explore this in the future depending on clinical course especially with a newer technique call envisia that is less invasive than the surgicaL  Biopsy   -Continue the excellent great work you are doing with physical activity and exercise and support group  -   You are a role model  -    fOLLOWUP -In 3 months do lft either with [place] or PCP Carollee Herter, Alferd Apa, DO -3 months tele visti with Dr Chase Caller or APP - 6 months do  spiro/dlco - 6 months with Dr Chase Caller - 33 min slot  SIGNATURE    Dr. Brand Males, M.D., F.C.C.P,  Pulmonary and Critical Care Medicine Staff Physician, [place] Director - Interstitial Lung Disease  Program  Pulmonary [place] at [place], [place], [address]  Pager: [phone], If no answer or between  15:00h - 7:00h: call [phone] Telephone: [phone]  12:04 PM 06/14/2019

## 2019-06-14 NOTE — Patient Instructions (Addendum)
ICD-10-CM   1. Idiopathic pulmonary fibrosis ([place])  J84.112   2. Nausea  R11.0   3. Abdominal pain, unspecified abdominal location  R10.9      Clinically stable with symptoms, walk test, CT chest and FVC. Mild decline in dlco part of breathing test between Aug 2020 -> feb 2021 could be "technical)  Mild intermittent nausea and abdominal pain at night due to ofev - currently self managing  LFT normal feb 2021   pLAN - continue ofev 150mg  twice daily  - try ginger either food or capsule for night nausea  - if bad, call us and we can do zofran  - next LFT check in 3 months  -  At this point we do not have to entertain lung biopsy but we can explore this in the future depending on clinical course especially with a newer technique call envisia that is less invasive than the surgicaL  Biopsy   -Continue the excellent great work you are doing with physical activity and exercise and support group  -   You are a role model  -    fOLLOWUP -In 3 months do lft either with [place] or PCP Carollee Herter, Alferd Apa, DO -3 months tele visti with Dr Chase Caller or APP - 6 months do spiro/dlco - 6 months with Dr Chase Caller - 15 min slot

## 2019-06-16 ENCOUNTER — Other Ambulatory Visit: Payer: Self-pay | Admitting: Family Medicine

## 2019-06-16 DIAGNOSIS — E876 Hypokalemia: Secondary | ICD-10-CM

## 2019-06-16 DIAGNOSIS — S63256A Unspecified dislocation of right little finger, initial encounter: Secondary | ICD-10-CM | POA: Diagnosis not present

## 2019-06-17 ENCOUNTER — Other Ambulatory Visit: Payer: Self-pay | Admitting: Family Medicine

## 2019-06-23 DIAGNOSIS — S63256D Unspecified dislocation of right little finger, subsequent encounter: Secondary | ICD-10-CM | POA: Diagnosis not present

## 2019-06-27 ENCOUNTER — Other Ambulatory Visit: Payer: Self-pay | Admitting: Family Medicine

## 2019-06-29 ENCOUNTER — Other Ambulatory Visit: Payer: Self-pay | Admitting: Internal Medicine

## 2019-06-29 DIAGNOSIS — J84112 Idiopathic pulmonary fibrosis: Secondary | ICD-10-CM

## 2019-06-30 DIAGNOSIS — S63259D Unspecified dislocation of unspecified finger, subsequent encounter: Secondary | ICD-10-CM | POA: Diagnosis not present

## 2019-07-04 DIAGNOSIS — M79644 Pain in right finger(s): Secondary | ICD-10-CM | POA: Diagnosis not present

## 2019-07-04 DIAGNOSIS — M7989 Other specified soft tissue disorders: Secondary | ICD-10-CM | POA: Diagnosis not present

## 2019-07-07 DIAGNOSIS — H10023 Other mucopurulent conjunctivitis, bilateral: Secondary | ICD-10-CM | POA: Diagnosis not present

## 2019-07-07 DIAGNOSIS — Z20822 Contact with and (suspected) exposure to covid-19: Secondary | ICD-10-CM | POA: Diagnosis not present

## 2019-07-07 DIAGNOSIS — J060 Acute laryngopharyngitis: Secondary | ICD-10-CM | POA: Diagnosis not present

## 2019-07-07 DIAGNOSIS — J069 Acute upper respiratory infection, unspecified: Secondary | ICD-10-CM | POA: Diagnosis not present

## 2019-07-07 DIAGNOSIS — J0141 Acute recurrent pansinusitis: Secondary | ICD-10-CM | POA: Diagnosis not present

## 2019-07-10 ENCOUNTER — Telehealth: Payer: Self-pay

## 2019-07-10 NOTE — Telephone Encounter (Signed)
Pt was seen at Urgent care on 07/07/2019 at [facility].

## 2019-07-10 NOTE — Telephone Encounter (Signed)
Nurse Assessment Nurse: Cherre Robins, RN, Ria Comment Date/Time (Eastern Time): 07/07/2019 11:10:14 AM  Confirm and document reason for call. If symptomatic, describe symptoms. ---Caller states she is having a flare up of IPF- that affects the lungs and she has a sinus infection and is requesting amoxicillin. States her sx are chills, headache "like a band in it", and post nasal drip. States she has a fever on and off, when she has the chills, does not have a thermometer. States she also has a cough. Has the patient had close contact with a person known or suspected to have the novel coronavirus illness OR traveled / lives in area with major community spread (including international travel) in the last 14 days from the onset of symptoms? * If Asymptomatic, screen for exposure and travel within the last 14 days. ---No Does the patient have any new or worsening symptoms? ---Yes Will a triage be completed? ---Yes Related visit to physician within the last 2 weeks? ---No Does the PT have any chronic conditions? (i.e. diabetes, asthma, this includes High risk factors for pregnancy, etc.) ---Yes List chronic conditions. ---IPF Is this a behavioral health or substance abuse call? ---No  Guidelines COVID-19 - Diagnosed or Suspected Patient sounds very sick or weak to the triager [place], RN, Ria Comment 07/07/2019 11:12:55 AMCall Id: [id number] [place]. Time Eilene Ghazi Time) Disposition Final User 07/07/2019 10:56:25 AM Attempt made - message left [place], RN, Ria Comment 07/07/2019 11:15:47 AM Go to ED Now (or PCP triage) Yes Weiss[place]Hilton, RN, Ria Comment-  Pt was undecided if she would go to ED

## 2019-07-12 ENCOUNTER — Other Ambulatory Visit: Payer: Self-pay | Admitting: Internal Medicine

## 2019-07-12 DIAGNOSIS — J84112 Idiopathic pulmonary fibrosis: Secondary | ICD-10-CM

## 2019-07-19 ENCOUNTER — Telehealth: Payer: Self-pay | Admitting: Internal Medicine

## 2019-07-19 NOTE — Telephone Encounter (Signed)
Spoke with the pt  She is aware [facility] has been trying to reach her  She will contact them  Has the phone number  Nothing further needed

## 2019-07-20 DIAGNOSIS — L821 Other seborrheic keratosis: Secondary | ICD-10-CM | POA: Diagnosis not present

## 2019-07-20 DIAGNOSIS — L814 Other melanin hyperpigmentation: Secondary | ICD-10-CM | POA: Diagnosis not present

## 2019-07-20 DIAGNOSIS — D1801 Hemangioma of skin and subcutaneous tissue: Secondary | ICD-10-CM | POA: Diagnosis not present

## 2019-07-20 DIAGNOSIS — L650 Telogen effluvium: Secondary | ICD-10-CM | POA: Diagnosis not present

## 2019-07-20 DIAGNOSIS — D225 Melanocytic nevi of trunk: Secondary | ICD-10-CM | POA: Diagnosis not present

## 2019-07-21 DIAGNOSIS — S63256D Unspecified dislocation of right little finger, subsequent encounter: Secondary | ICD-10-CM | POA: Diagnosis not present

## 2019-07-26 ENCOUNTER — Ambulatory Visit (INDEPENDENT_AMBULATORY_CARE_PROVIDER_SITE_OTHER): Payer: Medicare Other | Admitting: Family

## 2019-07-26 ENCOUNTER — Encounter: Payer: Self-pay | Admitting: Family

## 2019-07-26 DIAGNOSIS — R197 Diarrhea, unspecified: Secondary | ICD-10-CM | POA: Diagnosis not present

## 2019-07-26 DIAGNOSIS — R339 Retention of urine, unspecified: Secondary | ICD-10-CM | POA: Diagnosis not present

## 2019-07-26 DIAGNOSIS — K649 Unspecified hemorrhoids: Secondary | ICD-10-CM | POA: Diagnosis not present

## 2019-07-26 NOTE — Progress Notes (Signed)
Virtual Visit via Telephone Note  I connected with Alexandria [name] on 07/26/19 at  9:00 AM EDT by telephone and verified that I am speaking with the correct person using two identifiers.  Location: Patient: home Provider: work   I discussed the limitations, risks, security and privacy concerns of performing an evaluation and management service by telephone and the availability of in person appointments. I also discussed with the patient that there may be a patient responsible charge related to this service. The patient expressed understanding and agreed to proceed.   History of Present Illness:  Patient is a 74 yr old female who presents today with several concerns:  Reports that she had constipation- last BM 2 days ago. Takes dicyclomine.  Reports that she was unable to urinate for 24 hours despite drinking plenty of water. Bladder felt full though she could not empty it. Today she developed diarrhea at 8:30 AM. She has had 2 large loose BM's in all so far this AM.  She initially had some abdominal discomfort but this resolved after she moved her bowels.  She reports that she has not been near any sick contacts. Reports that she immediately was able to urinate following her bowel movement this AM. Had neg covid test on Good Friday. She has declined the covid vaccine.   Hemorrhoid- Reports + hemorrhoid on the left rectal area.  This seemed to worsen due to her constipation. She reports associated discomfort.      Observations/Objective:  Gen: awake, calm, no obvious distress Neuro: Speech is clear, A and O x 3 Psych: calm/pleasant demeanor.   Assessment and Plan:  Diarrhea- advised pt to let me know if her diarrhea persists. She is advised to continue hydration efforts.  Urinary Retention- resolved. Was likely exacerbated by her constipation/?impaction.  Pt is advised to go to the ER if she has recurrence of urinary retention.    Hemorrhoid- advised pt to continue high fiber  diet, adequate hydration.  She can apply preparation H and tucks pads as needed. Recommended sitz baths twice daily as needed with epsom salts.     Follow Up Instructions:    I discussed the assessment and treatment plan with the patient. The patient was provided an opportunity to ask questions and all were answered. The patient agreed with the plan and demonstrated an understanding of the instructions.   The patient was advised to call back or seek an in-person evaluation if the symptoms worsen or if the condition fails to improve as anticipated.  I provided 15 minutes of non-face-to-face time during this encounter.   Nance Pear, NP

## 2019-07-28 ENCOUNTER — Other Ambulatory Visit (HOSPITAL_COMMUNITY): Payer: Medicare Other

## 2019-07-29 ENCOUNTER — Encounter: Payer: Self-pay | Admitting: Cardiology

## 2019-07-31 DIAGNOSIS — S63256D Unspecified dislocation of right little finger, subsequent encounter: Secondary | ICD-10-CM | POA: Diagnosis not present

## 2019-08-02 DIAGNOSIS — H43819 Vitreous degeneration, unspecified eye: Secondary | ICD-10-CM | POA: Diagnosis not present

## 2019-08-09 DIAGNOSIS — M25641 Stiffness of right hand, not elsewhere classified: Secondary | ICD-10-CM | POA: Diagnosis not present

## 2019-08-09 DIAGNOSIS — M79644 Pain in right finger(s): Secondary | ICD-10-CM | POA: Diagnosis not present

## 2019-08-09 DIAGNOSIS — S63259D Unspecified dislocation of unspecified finger, subsequent encounter: Secondary | ICD-10-CM | POA: Diagnosis not present

## 2019-08-21 DIAGNOSIS — J849 Interstitial pulmonary disease, unspecified: Secondary | ICD-10-CM | POA: Diagnosis not present

## 2019-08-21 DIAGNOSIS — R0602 Shortness of breath: Secondary | ICD-10-CM | POA: Diagnosis not present

## 2019-08-23 DIAGNOSIS — S63259D Unspecified dislocation of unspecified finger, subsequent encounter: Secondary | ICD-10-CM | POA: Diagnosis not present

## 2019-08-23 DIAGNOSIS — M25641 Stiffness of right hand, not elsewhere classified: Secondary | ICD-10-CM | POA: Diagnosis not present

## 2019-08-23 DIAGNOSIS — M79644 Pain in right finger(s): Secondary | ICD-10-CM | POA: Diagnosis not present

## 2019-08-24 ENCOUNTER — Other Ambulatory Visit: Payer: Self-pay | Admitting: Family Medicine

## 2019-08-24 ENCOUNTER — Telehealth: Payer: Self-pay | Admitting: Family Medicine

## 2019-08-24 DIAGNOSIS — E039 Hypothyroidism, unspecified: Secondary | ICD-10-CM

## 2019-08-24 DIAGNOSIS — I10 Essential (primary) hypertension: Secondary | ICD-10-CM

## 2019-08-24 NOTE — Telephone Encounter (Signed)
Please advise 

## 2019-08-24 NOTE — Telephone Encounter (Signed)
CallerYatzil [name]  Call Back # [phone]   Patient si requesting lab order placed in epic for liver, tsh, and cmp,bmp  Please Advise

## 2019-08-24 NOTE — Telephone Encounter (Signed)
Order is in.

## 2019-08-25 DIAGNOSIS — M25641 Stiffness of right hand, not elsewhere classified: Secondary | ICD-10-CM | POA: Diagnosis not present

## 2019-08-25 DIAGNOSIS — M79644 Pain in right finger(s): Secondary | ICD-10-CM | POA: Diagnosis not present

## 2019-08-25 DIAGNOSIS — S63259D Unspecified dislocation of unspecified finger, subsequent encounter: Secondary | ICD-10-CM | POA: Diagnosis not present

## 2019-08-25 NOTE — Telephone Encounter (Signed)
Spoke with patient. Lab appt set up

## 2019-08-29 ENCOUNTER — Ambulatory Visit: Payer: Medicare Other | Admitting: Cardiology

## 2019-08-30 ENCOUNTER — Encounter: Payer: Self-pay | Admitting: Cardiology

## 2019-08-30 ENCOUNTER — Ambulatory Visit (INDEPENDENT_AMBULATORY_CARE_PROVIDER_SITE_OTHER): Payer: Medicare Other | Admitting: Cardiology

## 2019-08-30 ENCOUNTER — Other Ambulatory Visit: Payer: Self-pay | Admitting: *Deleted

## 2019-08-30 ENCOUNTER — Other Ambulatory Visit: Payer: Self-pay

## 2019-08-30 VITALS — BP 132/80 | HR 76 | Ht 63.0 in | Wt 134.0 lb

## 2019-08-30 DIAGNOSIS — R0602 Shortness of breath: Secondary | ICD-10-CM | POA: Diagnosis not present

## 2019-08-30 DIAGNOSIS — R0609 Other forms of dyspnea: Secondary | ICD-10-CM

## 2019-08-30 DIAGNOSIS — I200 Unstable angina: Secondary | ICD-10-CM

## 2019-08-30 DIAGNOSIS — R931 Abnormal findings on diagnostic imaging of heart and coronary circulation: Secondary | ICD-10-CM

## 2019-08-30 DIAGNOSIS — I10 Essential (primary) hypertension: Secondary | ICD-10-CM | POA: Diagnosis not present

## 2019-08-30 DIAGNOSIS — I5189 Other ill-defined heart diseases: Secondary | ICD-10-CM | POA: Diagnosis not present

## 2019-08-30 DIAGNOSIS — R9389 Abnormal findings on diagnostic imaging of other specified body structures: Secondary | ICD-10-CM | POA: Diagnosis not present

## 2019-08-30 DIAGNOSIS — Z8673 Personal history of transient ischemic attack (TIA), and cerebral infarction without residual deficits: Secondary | ICD-10-CM | POA: Diagnosis not present

## 2019-08-30 DIAGNOSIS — R0600 Dyspnea, unspecified: Secondary | ICD-10-CM

## 2019-08-30 NOTE — Progress Notes (Signed)
Cardiology Office Note:    Date:  08/30/2019   ID:  Alexandria [name], DOB 10-19-1945, MRN [id number]  PCP:  Carollee Herter, Alferd Apa, DO  Cardiologist:  Berniece Salines, DO  Electrophysiologist:  None   Referring MD: Berniece Salines, DO   Chief Complaint  Patient presents with  . Follow-up  " I have been really short of breath my pulmonologist does not think is my lungs"  History of Present Illness:    Alexandria [name] is a 74 y.o. female with a hx of vasospasm, history of 2 separate left heart catheterization in 2004 and in 2015 which were all reported to be normal, diastolic dysfunction, history of TIAs, history of DVTs, paroxysmal supraventricular tachycardia, idiopathic pulmonary fibrosis and chronic venous insufficiency.   I did see the patient for the first time on May 09, 2019 at that time she was here to transition cardiac care to my office.  At that visit I recommended patient to bilateral cardiac ultrasound.  We also switch her Lasix to Tuesday Thursdays and Saturdays.  Today she is here for a visit she is very concerned stating that she did discuss with her pulmonary doctor because she has been experiencing shortness of breath worsening on exertion and she was told by her pulmonary doctor that this is not due to her IPF.  She has not admitted to significant chest pain.  But states that there is some twinges.  No other complaints at this time.  Past Medical History:  Diagnosis Date  . Arthritis   . Asthmatic bronchitis with acute exacerbation 02/06/2015  . Bronchial pneumonia   . Colon polyp   . Diverticulitis   . DVT (deep venous thrombosis) ([place])    lower extremity  . H/O cardiac catheterization 2004   Normal coronary arteries  . Heart murmur   . History of stress test 05/21/2011  . Hx of echocardiogram 01/27/2010   Normal Ef 55% the transmitral spectral doppler flow pattern is normal for age. the left ventricular wall motion is normal  . IPF (idiopathic pulmonary  fibrosis) ([place]) 01/2018  . Migraines   . Pancreatitis   . Thyroid disease   . TIA (transient ischemic attack)    8 years ago    Past Surgical History:  Procedure Laterality Date  . BREAST BIOPSY     [facility]  . CARDIAC CATHETERIZATION     10/2013  . CATARACT EXTRACTION Right 03/22/2018  . LEFT HEART CATHETERIZATION WITH CORONARY ANGIOGRAM N/A 10/25/2013   Procedure: LEFT HEART CATHETERIZATION WITH CORONARY ANGIOGRAM;  Surgeon: Troy Sine, MD;  Location: [facility] CATH LAB;  Service: Cardiovascular;  Laterality: N/A;  . NECK SURGERY     x's 2  . TOTAL KNEE ARTHROPLASTY     Bilateral x's 2  . VAGINAL HYSTERECTOMY  10/17/1998   [place] BRONCHOSCOPY Bilateral 01/24/2018   Procedure: VIDEO BRONCHOSCOPY WITH FLUORO;  Surgeon: [name], MD;  Location: [place] [facility];  Service: Cardiopulmonary;  Laterality: Bilateral;    Current Medications: Current Meds  Medication Sig  . Biotin (RA BIOTIN) 1000 MCG tablet Take 1,000 mcg by mouth 2 (two) times daily.   . clopidogrel (PLAVIX) 75 MG tablet TAKE 1 TABLET BY MOUTH EVERY DAY  . dicyclomine (BENTYL) 20 MG tablet TAKE 1 TABLET BY MOUTH FOUR TIMES A DAY AS NEEDED  . furosemide (LASIX) 40 MG tablet TAKE 1 TABLET BY MOUTH TWICE A DAY  . KLOR-CON M20 20 MEQ tablet TAKE 1 TABLET (20  MEQ TOTAL) BY MOUTH 2 (TWO) TIMES DAILY. WILL TAKE 3 A DAILY ONLY AS NEEDED.  . metoprolol succinate (TOPROL-XL) 25 MG 24 hr tablet TAKE 1 TABLET BY MOUTH EVERY DAY  . Multiple Vitamin (MULTIVITAMIN) tablet Take 1 tablet by mouth every other day. Gluten free/Vegetarian Multivitamin  . OFEV 150 MG CAPS TAKE 1 CAPSULE BY MOUTH TWICE DAILY 12 HOURS APART WITH FOOD. SWALLOW WHOLE. CALL [phone] FOR REFILLS  . SYNTHROID 112 MCG tablet TAKE 1 TABLET BY MOUTH EVERY DAY BEFORE BREAKFAST  . vitamin C (ASCORBIC ACID) 500 MG tablet Take 1,000 mg by mouth 2 (two) times daily.   . Vitamin D, Ergocalciferol, (DRISDOL) 1.25 MG (50000 UNIT) CAPS capsule Take 1  capsule (50,000 Units total) by mouth every 7 (seven) days. Take for 8 total doses(weeks)  . vitamin E 1000 UNIT capsule Take 1,000 Units by mouth daily.     Allergies:   Prednisone, Atrovent hfa [ipratropium bromide hfa], Cheratussin ac [guaifenesin-codeine], Codeine, Hydrocodone, Influenza vaccines, Motrin [ibuprofen], and Oxycodone-acetaminophen   Social History   Socioeconomic History  . Marital status: Married    Spouse name: Alexandria [name]  . Number of children: 3  . Years of education: [employer]  . Highest education level: Not on file  Occupational History  . Occupation: DISABLED    Employer: UNEMPLOYED  Tobacco Use  . Smoking status: Former Smoker    Years: 2.00    Quit date: 04/06/1969    Years since quitting: 50.4  . Smokeless tobacco: Never Used  . Tobacco comment: socially smoked x 2 years  Substance and Sexual Activity  . Alcohol use: No    Alcohol/week: 0.0 standard drinks  . Drug use: No  . Sexual activity: Not on file  Other Topics Concern  . Not on file  Social History Narrative   Lives with husband Alexandria [name]   Caffeine use: coffee (2 cups per day)   Mostly right-handed   Social Determinants of Health   Financial Resource Strain:   . Difficulty of Paying Living Expenses:   Food Insecurity:   . Worried About Charity fundraiser in the Last Year:   . Arboriculturist in the Last Year:   Transportation Needs:   . Film/video editor (Medical):   Marland Kitchen Lack of Transportation (Non-Medical):   Physical Activity:   . Days of Exercise per Week:   . Minutes of Exercise per Session:   Stress:   . Feeling of Stress :   Social Connections:   . Frequency of Communication with Friends and Family:   . Frequency of Social Gatherings with Friends and Family:   . Attends Religious Services:   . Active Member of Clubs or Organizations:   . Attends Archivist Meetings:   Marland Kitchen Marital Status:      Family History: The patient's family history includes Arthritis in an  other family member; Colon cancer in her brother and father; Diabetes in her maternal grandmother; HIV in her brother; Heart disease in her maternal grandmother; Hypertension in her maternal grandmother; Kidney cancer in her brother; Lung cancer in her brother; Other in her brother; Ovarian cancer in her mother; Prostate cancer in her father; Stroke in her maternal grandmother; Uterine cancer in her mother.  ROS:   Review of Systems  Constitution: Negative for decreased appetite, fever and weight gain.  HENT: Negative for congestion, ear discharge, hoarse voice and sore throat.   Eyes: Negative for discharge, redness, vision loss in right eye and  visual halos.  Cardiovascular: Negative for chest pain, dyspnea on exertion, leg swelling, orthopnea and palpitations.  Respiratory: Negative for cough, hemoptysis, shortness of breath and snoring.   Endocrine: Negative for heat intolerance and polyphagia.  Hematologic/Lymphatic: Negative for bleeding problem. Does not bruise/bleed easily.  Skin: Negative for flushing, nail changes, rash and suspicious lesions.  Musculoskeletal: Negative for arthritis, joint pain, muscle cramps, myalgias, neck pain and stiffness.  Gastrointestinal: Negative for abdominal pain, bowel incontinence, diarrhea and excessive appetite.  Genitourinary: Negative for decreased libido, genital sores and incomplete emptying.  Neurological: Negative for brief paralysis, focal weakness, headaches and loss of balance.  Psychiatric/Behavioral: Negative for altered mental status, depression and suicidal ideas.  Allergic/Immunologic: Negative for HIV exposure and persistent infections.    EKGs/Labs/Other Studies Reviewed:    The following studies were reviewed today:   EKG:  The ekg ordered today demonstrates   [place] bilateral carotids Summary:  Right Carotid: There is no evidence of stenosis in the right ICA.   Left Carotid: There is no evidence of stenosis in the left ICA.    Vertebrals: Bilateral vertebral arteries demonstrate antegrade flow.  Subclavians: Normal flow hemodynamics were seen in bilateral subclavian arteries.  TTE IMPRESSIONS May 12, 2019 1. Left ventricular ejection fraction, by visual estimation, is 55 to  60%. The left ventricle has normal function. There is no left ventricular  hypertrophy.  2. Left ventricular diastolic parameters are consistent with Grade I  diastolic dysfunction (impaired relaxation).  3. The left ventricle has no regional wall motion abnormalities.  4. Global right ventricle has normal systolic function.The right  ventricular size is normal. No increase in right ventricular wall  thickness.  5. Left atrial size was normal.  6. Right atrial size was normal.  7. The mitral valve is normal in structure. Mild mitral valve  regurgitation. No evidence of mitral stenosis.  8. The tricuspid valve is normal in structure. Tricuspid valve  regurgitation is mild.  9. The aortic valve is normal in structure. Aortic valve regurgitation is  mild to moderate. No evidence of aortic valve sclerosis or stenosis.  10. The pulmonic valve was normal in structure. Pulmonic valve  regurgitation is not visualized.  11. There is mild dilatation of the ascending aorta measuring 38 mm.  12. Normal pulmonary artery systolic pressure.   Recent Labs: 02/07/2019: Hemoglobin 13.4; Platelets 229.0; TSH 1.13 05/29/2019: ALT 19; BUN 14; Creatinine, Ser 0.84; Potassium 3.4; Sodium 139  Recent Lipid Panel    Component Value Date/Time   CHOL 199 02/07/2019 1218   TRIG 73.0 02/07/2019 1218   HDL 63.90 02/07/2019 1218   CHOLHDL 3 02/07/2019 1218   VLDL 14.6 02/07/2019 1218   LDLCALC 120 (H) 02/07/2019 1218    Physical Exam:    VS:  BP 132/80   Pulse 76   Ht '5\' 3"'$  (1.6 m)   Wt 134 lb (60.8 kg)   SpO2 95%   BMI 23.74 kg/m     Wt Readings from Last 3 Encounters:  08/30/19 134 lb (60.8 kg)  06/14/19 138 lb 9.6 oz (62.9 kg)   05/29/19 135 lb (61.2 kg)     GEN: Well nourished, well developed in no acute distress HEENT: Normal NECK: No JVD; No carotid bruits LYMPHATICS: No lymphadenopathy CARDIAC: S1S2 noted,RRR, 2/6 systolic murmurs, rubs, gallops RESPIRATORY:  Clear to auscultation without rales, wheezing or rhonchi  ABDOMEN: Soft, non-tender, non-distended, +bowel sounds, no guarding. EXTREMITIES: No edema, No cyanosis, no clubbing MUSCULOSKELETAL:  No deformity  SKIN:  Warm and dry NEUROLOGIC:  Alert and oriented x 3, non-focal PSYCHIATRIC:  Normal affect, good insight  ASSESSMENT:    1. Essential hypertension   2. Dyspnea on exertion   3. Abnormal CT of the chest   4. SOB (shortness of breath)   5. History of transient ischemic attack (TIA)   6. Diastolic dysfunction    PLAN:     1.  Shortness of breath may be her anginal equivalent and is concerning and at this time, also hawk CT of the chest in 2010 showed concern for coronary calcium therefore with the symptoms I like to get a coronary CTA to evaluate and rule out development of coronary artery disease.  2.  Hypertension-continue patient on her current medication regimen.  3.  TIA, no symptoms -continue with Plavix.  Patient prefers to be off statin for now.  4.  Diastolic dysfunction-continue with cautious diuretics.  5.  Blood work will be done today to assess creatinine function.  The patient is in agreement with the above plan. The patient left the office in stable condition.  The patient will follow up in 3 months or sooner if needed.   Medication Adjustments/Labs and Tests Ordered: Current medicines are reviewed at length with the patient today.  Concerns regarding medicines are outlined above.  Orders Placed This Encounter  Procedures  . CT CORONARY MORPH W/CTA COR W/SCORE W/CA W/CM &/OR WO/CM  . CT CORONARY FRACTIONAL FLOW RESERVE DATA PREP  . CT CORONARY FRACTIONAL FLOW RESERVE FLUID ANALYSIS  . Basic metabolic panel   No  orders of the defined types were placed in this encounter.   Patient Instructions  Medication Instructions:  Your physician recommends that you continue on your current medications as directed. Please refer to the Current Medication list given to you today.  *If you need a refill on your cardiac medications before your next appointment, please call your pharmacy*   Lab Work: At time of CT:  BMET    If you have labs (blood work) drawn today and your tests are completely normal, you will receive your results only by: Marland Kitchen MyChart Message (if you have MyChart) OR . A paper copy in the mail If you have any lab test that is abnormal or we need to change your treatment, we will call you to review the results.   Testing/Procedures: Your physician has requested that you have cardiac CT. Cardiac computed tomography (CT) is a painless test that uses an x-ray machine to take clear, detailed pictures of your heart. For further information please visit [email or web address]. Please follow instruction sheet BELOW:   Your cardiac CT will be scheduled at one of the below locations:   [facility] [address] [place], [place] [address] [phone]  If scheduled at [facility], please arrive at the [facility] main entrance of [facility] 30 minutes prior to test start time. Proceed to the [facility] Radiology Department (first floor) to check-in and test prep.  Please follow these instructions carefully (unless otherwise directed):   On the Night Before the Test: . Be sure to Drink plenty of water. . Do not consume any caffeinated/decaffeinated beverages or chocolate 12 hours prior to your test. . Do not take any antihistamines 12 hours prior to your test.   On the Day of the Test: . Drink plenty of water. Do not drink any water within one hour of the test. . Do not eat any food 4 hours prior to the  test. . You may take your regular medications prior to the  test.  . Take metoprolol (Lopressor) 100 MG two hours prior to test. (this has been sent to your pharmacy) . HOLD Furosemide morning of the test. . FEMALES- please wear underwire-free bra if available   After the Test: . Drink plenty of water. . After receiving IV contrast, you may experience a mild flushed feeling. This is normal. . On occasion, you may experience a mild rash up to 24 hours after the test. This is not dangerous. If this occurs, you can take Benadryl 25 mg and increase your fluid intake. . If you experience trouble breathing, this can be serious. If it is severe call 911 IMMEDIATELY. If it is mild, please call our office.   Once we have confirmed authorization from your insurance company, we will call you to set up a date and time for your test.   For non-scheduling related questions, please contact the cardiac imaging nurse navigator should you have any questions/concerns: Marchia Bond, Cardiac Imaging Nurse Navigator Burley Saver, Interim Cardiac Imaging Nurse [place] [facility] Direct Office Dial: [phone]   For scheduling needs, including cancellations and rescheduling, please call [phone].        Follow-Up: At [facility], you and your health needs are our priority.  As part of our continuing mission to provide you with exceptional heart care, we have created designated Provider Care Teams.  These Care Teams include your primary Cardiologist (physician) and Advanced Practice Providers (APPs -  Physician Assistants and Nurse Practitioners) who all work together to provide you with the care you need, when you need it.  We recommend signing up for the patient portal called "MyChart".  Sign up information is provided on this After Visit Summary.  MyChart is used to connect with patients for Virtual Visits (Telemedicine).  Patients are able to view lab/test results, encounter notes, upcoming appointments, etc.  Non-urgent messages  can be sent to your provider as well.   To learn more about what you can do with MyChart, go to [email or web address].    Your next appointment:   3 month(s)  The format for your next appointment:   In Person  Provider:   Berniece Salines, DO   Other Instructions      Adopting a Healthy Lifestyle.  Know what a healthy weight is for you (roughly BMI <25) and aim to maintain this   Aim for 7+ servings of fruits and vegetables daily   65-80+ fluid ounces of water or unsweet tea for healthy kidneys   Limit to max 1 drink of alcohol per day; avoid smoking/tobacco   Limit animal fats in diet for cholesterol and heart health - choose grass fed whenever available   Avoid highly processed foods, and foods high in saturated/trans fats   Aim for low stress - take time to unwind and care for your mental health   Aim for 150 min of moderate intensity exercise weekly for heart health, and weights twice weekly for bone health   Aim for 7-9 hours of sleep daily   When it comes to diets, agreement about the perfect plan isnt easy to find, even among the experts. Experts at the [place] developed an idea known as the Healthy Eating Plate. Just imagine a plate divided into logical, healthy portions.   The emphasis is on diet quality:   Load up on vegetables and fruits - one-half of your plate: Aim  for color and variety, and remember that potatoes dont count.   Go for whole grains - one-quarter of your plate: Whole wheat, barley, wheat berries, quinoa, oats, brown rice, and foods made with them. If you want pasta, go with whole wheat pasta.   Protein power - one-quarter of your plate: Fish, chicken, beans, and nuts are all healthy, versatile protein sources. Limit red meat.   The diet, however, does go beyond the plate, offering a few other suggestions.   Use healthy plant oils, such as olive, canola, soy, corn, sunflower and peanut. Check the labels, and avoid  partially hydrogenated oil, which have unhealthy trans fats.   If youre thirsty, drink water. Coffee and tea are good in moderation, but skip sugary drinks and limit milk and dairy products to one or two daily servings.   The type of carbohydrate in the diet is more important than the amount. Some sources of carbohydrates, such as vegetables, fruits, whole grains, and beans-are healthier than others.   Finally, stay active  Signed, Berniece Salines, DO  08/30/2019 6:17 PM    [facility]

## 2019-08-30 NOTE — Patient Instructions (Addendum)
Medication Instructions:  Your physician recommends that you continue on your current medications as directed. Please refer to the Current Medication list given to you today.  *If you need a refill on your cardiac medications before your next appointment, please call your pharmacy*   Lab Work: At time of CT:  BMET    If you have labs (blood work) drawn today and your tests are completely normal, you will receive your results only by: Marland Kitchen MyChart Message (if you have MyChart) OR . A paper copy in the mail If you have any lab test that is abnormal or we need to change your treatment, we will call you to review the results.   Testing/Procedures: Your physician has requested that you have cardiac CT. Cardiac computed tomography (CT) is a painless test that uses an x-ray machine to take clear, detailed pictures of your heart. For further information please visit [email or web address]. Please follow instruction sheet BELOW:   Your cardiac CT will be scheduled at one of the below locations:   [facility] [address] [place], [place] [address] [phone]  If scheduled at [facility], please arrive at the [facility] main entrance of [facility] 30 minutes prior to test start time. Proceed to the [facility] Radiology Department (first floor) to check-in and test prep.  Please follow these instructions carefully (unless otherwise directed):   On the Night Before the Test: . Be sure to Drink plenty of water. . Do not consume any caffeinated/decaffeinated beverages or chocolate 12 hours prior to your test. . Do not take any antihistamines 12 hours prior to your test.   On the Day of the Test: . Drink plenty of water. Do not drink any water within one hour of the test. . Do not eat any food 4 hours prior to the test. . You may take your regular medications prior to the test.  . Take metoprolol (Lopressor) 100 MG two hours prior to test. (this has been sent  to your pharmacy) . HOLD Furosemide morning of the test. . FEMALES- please wear underwire-free bra if available   After the Test: . Drink plenty of water. . After receiving IV contrast, you may experience a mild flushed feeling. This is normal. . On occasion, you may experience a mild rash up to 24 hours after the test. This is not dangerous. If this occurs, you can take Benadryl 25 mg and increase your fluid intake. . If you experience trouble breathing, this can be serious. If it is severe call 911 IMMEDIATELY. If it is mild, please call our office.   Once we have confirmed authorization from your insurance company, we will call you to set up a date and time for your test.   For non-scheduling related questions, please contact the cardiac imaging nurse navigator should you have any questions/concerns: Marchia Bond, Cardiac Imaging Nurse Navigator Burley Saver, Interim Cardiac Imaging Nurse [facility] Direct Office Dial: [phone]   For scheduling needs, including cancellations and rescheduling, please call [phone].        Follow-Up: At [facility], you and your health needs are our priority.  As part of our continuing mission to provide you with exceptional heart care, we have created designated Provider Care Teams.  These Care Teams include your primary Cardiologist (physician) and Advanced Practice Providers (APPs -  Physician Assistants and Nurse Practitioners) who all work together to provide you with the care you need, when you  need it.  We recommend signing up for the patient portal called "MyChart".  Sign up information is provided on this After Visit Summary.  MyChart is used to connect with patients for Virtual Visits (Telemedicine).  Patients are able to view lab/test results, encounter notes, upcoming appointments, etc.  Non-urgent messages can be sent to your provider as well.   To learn more about what you can do with  MyChart, go to [email or web address].    Your next appointment:   3 month(s)  The format for your next appointment:   In Person  Provider:   Berniece Salines, DO   Other Instructions

## 2019-09-05 NOTE — Addendum Note (Signed)
Addended by: Kelle Darting A on: 09/05/2019 04:18 PM   Modules accepted: Orders

## 2019-09-06 ENCOUNTER — Other Ambulatory Visit: Payer: Self-pay

## 2019-09-06 ENCOUNTER — Other Ambulatory Visit (INDEPENDENT_AMBULATORY_CARE_PROVIDER_SITE_OTHER): Payer: Medicare Other

## 2019-09-06 DIAGNOSIS — E039 Hypothyroidism, unspecified: Secondary | ICD-10-CM | POA: Diagnosis not present

## 2019-09-06 DIAGNOSIS — I10 Essential (primary) hypertension: Secondary | ICD-10-CM

## 2019-09-06 LAB — COMPREHENSIVE METABOLIC PANEL
ALT: 18 U/L (ref 0–35)
AST: 18 U/L (ref 0–37)
Albumin: 4 g/dL (ref 3.5–5.2)
Alkaline Phosphatase: 70 U/L (ref 39–117)
BUN: 20 mg/dL (ref 6–23)
CO2: 32 mEq/L (ref 19–32)
Calcium: 9.1 mg/dL (ref 8.4–10.5)
Chloride: 101 mEq/L (ref 96–112)
Creatinine, Ser: 0.81 mg/dL (ref 0.40–1.20)
GFR: 68.98 mL/min (ref >60.00)
Glucose, Bld: 99 mg/dL (ref 70–99)
Potassium: 3.3 mEq/L — ABNORMAL LOW (ref 3.5–5.1)
Sodium: 140 mEq/L (ref 135–145)
Total Bilirubin: 0.5 mg/dL (ref 0.2–1.2)
Total Protein: 6.5 g/dL (ref 6.0–8.3)

## 2019-09-06 LAB — LIPID PANEL
Cholesterol: 193 mg/dL (ref 0–200)
HDL: 52.6 mg/dL (ref >39.00)
LDL Cholesterol: 120 mg/dL — ABNORMAL HIGH (ref 0–99)
NonHDL: 140.53
Total CHOL/HDL Ratio: 4
Triglycerides: 104 mg/dL (ref 0.0–149.0)
VLDL: 20.8 mg/dL (ref 0.0–40.0)

## 2019-09-06 LAB — TSH: TSH: 2.16 u[IU]/mL (ref 0.35–4.50)

## 2019-09-07 ENCOUNTER — Encounter: Payer: Self-pay | Admitting: Family Medicine

## 2019-09-07 ENCOUNTER — Ambulatory Visit (INDEPENDENT_AMBULATORY_CARE_PROVIDER_SITE_OTHER): Payer: Medicare Other | Admitting: Family Medicine

## 2019-09-07 VITALS — BP 130/90 | HR 71 | Temp 97.9°F | Resp 18 | Ht 63.0 in | Wt 135.6 lb

## 2019-09-07 DIAGNOSIS — I200 Unstable angina: Secondary | ICD-10-CM

## 2019-09-07 DIAGNOSIS — L84 Corns and callosities: Secondary | ICD-10-CM

## 2019-09-07 DIAGNOSIS — R110 Nausea: Secondary | ICD-10-CM | POA: Insufficient documentation

## 2019-09-07 MED ORDER — ONDANSETRON 4 MG PO TBDP: 4.0000 mg | ORAL_TABLET | Freq: Three times a day (TID) | ORAL | 2 refills | Status: DC | PRN

## 2019-09-07 NOTE — Assessment & Plan Note (Signed)
From olev-----  pulm would like to to stay on it zofran rx for pt --- 4 mg --- she can take 8 mg if needed

## 2019-09-07 NOTE — Assessment & Plan Note (Signed)
Refer to podiatry

## 2019-09-07 NOTE — Progress Notes (Signed)
Patient ID: Alexandria [name], female    DOB: 1945-07-26  Age: 74 y.o. MRN: [id number]    Subjective:  Subjective  HPI [name] presents for f/u nausea from meds for ILS --- ofev is causing pain and nausea --- pulm wants her to stay on it and put up with pain and nausea ----- she is in excruciating pain after each dose for a little while -- she will lay down and it eventually subsides and then she can eat something light  She also has questions about abn toenail and callous on L big toe ----  It is tender .      Review of Systems  Constitutional: Negative for appetite change, diaphoresis, fatigue and unexpected weight change.  Eyes: Negative for pain, redness and visual disturbance.  Respiratory: Negative for cough, chest tightness, shortness of breath and wheezing.   Cardiovascular: Negative for chest pain, palpitations and leg swelling.  Endocrine: Negative for cold intolerance, heat intolerance, polydipsia, polyphagia and polyuria.  Genitourinary: Negative for difficulty urinating, dysuria and frequency.  Neurological: Negative for dizziness, light-headedness, numbness and headaches.    History Past Medical History:  Diagnosis Date  . Arthritis   . Asthmatic bronchitis with acute exacerbation 02/06/2015  . Bronchial pneumonia   . Colon polyp   . Diverticulitis   . DVT (deep venous thrombosis) ([place])    lower extremity  . H/O cardiac catheterization 2004   Normal coronary arteries  . Heart murmur   . History of stress test 05/21/2011  . Hx of echocardiogram 01/27/2010   Normal Ef 55% the transmitral spectral doppler flow pattern is normal for age. the left ventricular wall motion is normal  . IPF (idiopathic pulmonary fibrosis) ([place]) 01/2018  . Migraines   . Pancreatitis   . Thyroid disease   . TIA (transient ischemic attack)    8 years ago    She has a past surgical history that includes Vaginal hysterectomy (10/17/1998); Breast biopsy; Total knee  arthroplasty; Neck surgery; Cardiac catheterization; left heart catheterization with coronary angiogram (N/A, 10/25/2013); Video bronchoscopy (Bilateral, 01/24/2018); and Cataract extraction (Right, 03/22/2018).   Her family history includes Arthritis in an other family member; Colon cancer in her brother and father; Diabetes in her maternal grandmother; HIV in her brother; Heart disease in her maternal grandmother; Hypertension in her maternal grandmother; Kidney cancer in her brother; Lung cancer in her brother; Other in her brother; Ovarian cancer in her mother; Prostate cancer in her father; Stroke in her maternal grandmother; Uterine cancer in her mother.She reports that she quit smoking about 50 years ago. She quit after 2.00 years of use. She has never used smokeless tobacco. She reports that she does not drink alcohol or use drugs.  Current Outpatient Medications on File Prior to Visit  Medication Sig Dispense Refill  . Biotin (RA BIOTIN) 1000 MCG tablet Take 1,000 mcg by mouth 2 (two) times daily.     . clopidogrel (PLAVIX) 75 MG tablet TAKE 1 TABLET BY MOUTH EVERY DAY 90 tablet 1  . dicyclomine (BENTYL) 20 MG tablet TAKE 1 TABLET BY MOUTH FOUR TIMES A DAY AS NEEDED 360 tablet 1  . furosemide (LASIX) 40 MG tablet TAKE 1 TABLET BY MOUTH TWICE A DAY 180 tablet 0  . KLOR-CON M20 20 MEQ tablet TAKE 1 TABLET (20 MEQ TOTAL) BY MOUTH 2 (TWO) TIMES DAILY. WILL TAKE 3 A DAILY ONLY AS NEEDED. 270 tablet 0  . metoprolol succinate (TOPROL-XL) 25 MG 24 hr tablet TAKE 1  TABLET BY MOUTH EVERY DAY 90 tablet 1  . Multiple Vitamin (MULTIVITAMIN) tablet Take 1 tablet by mouth every other day. Gluten free/Vegetarian Multivitamin    . OFEV 150 MG CAPS TAKE 1 CAPSULE BY MOUTH TWICE DAILY 12 HOURS APART WITH FOOD. SWALLOW WHOLE. CALL [phone] FOR REFILLS 60 capsule 4  . SYNTHROID 112 MCG tablet TAKE 1 TABLET BY MOUTH EVERY DAY BEFORE BREAKFAST 90 tablet 1  . vitamin C (ASCORBIC ACID) 500 MG tablet Take 1,000 mg  by mouth 2 (two) times daily.     . Vitamin D, Ergocalciferol, (DRISDOL) 1.25 MG (50000 UNIT) CAPS capsule Take 1 capsule (50,000 Units total) by mouth every 7 (seven) days. Take for 8 total doses(weeks) 8 capsule 0  . vitamin E 1000 UNIT capsule Take 1,000 Units by mouth daily.     No current facility-administered medications on file prior to visit.     Objective:  Objective  Physical Exam Vitals and nursing note reviewed.  Constitutional:      Appearance: She is well-developed.  HENT:     Head: Normocephalic and atraumatic.  Eyes:     Conjunctiva/sclera: Conjunctivae normal.  Neck:     Thyroid: No thyromegaly.     Vascular: No carotid bruit or JVD.  Cardiovascular:     Rate and Rhythm: Normal rate and regular rhythm.     Heart sounds: Normal heart sounds. No murmur.  Pulmonary:     Effort: Pulmonary effort is normal. No respiratory distress.     Breath sounds: Normal breath sounds. No wheezing or rales.  Chest:     Chest wall: No tenderness.  Abdominal:     General: There is no distension.     Tenderness: There is no abdominal tenderness. There is no right CVA tenderness, left CVA tenderness, guarding or rebound.     Hernia: No hernia is present.  Musculoskeletal:     Cervical back: Normal range of motion and neck supple.  Neurological:     Mental Status: She is alert and oriented to person, place, and time.    BP 130/90 (BP Location: Left Arm, Patient Position: Sitting, Cuff Size: Normal)   Pulse 71   Temp 97.9 F (36.6 C) (Temporal)   Resp 18   Ht 5\' 3"  (1.6 m)   Wt 135 lb 9.6 oz (61.5 kg)   SpO2 98%   BMI 24.02 kg/m  Wt Readings from Last 3 Encounters:  09/07/19 135 lb 9.6 oz (61.5 kg)  08/30/19 134 lb (60.8 kg)  06/14/19 138 lb 9.6 oz (62.9 kg)     Lab Results  Component Value Date   WBC 6.3 02/07/2019   HGB 13.4 02/07/2019   HCT 39.8 02/07/2019   PLT 229.0 02/07/2019   GLUCOSE 99 09/06/2019   CHOL 193 09/06/2019   TRIG 104.0 09/06/2019   HDL  52.60 09/06/2019   LDLCALC 120 (H) 09/06/2019   ALT 18 09/06/2019   AST 18 09/06/2019   NA 140 09/06/2019   K 3.3 (L) 09/06/2019   CL 101 09/06/2019   CREATININE 0.81 09/06/2019   BUN 20 09/06/2019   CO2 32 09/06/2019   TSH 2.16 09/06/2019   INR 1.1 (H) 05/07/2015    DG Hand Complete Right  Result Date: 05/29/2019 CLINICAL DATA:  previous injury to hand one week ago but only little finger was imaged. Having middle finger pain,swelling and bruising and wants assessed. EXAM: RIGHT HAND - COMPLETE 3+ VIEW COMPARISON:  Little finger radiograph 05/24/2019 FINDINGS: Overlying splint material  in place limits osseous and soft tissue fine detail. Previous fifth digit dislocation at the proximal interphalangeal joint has been reduced. Small fracture fragment on prior exam is not well visualized due to overlying splint material. No evidence of acute fracture of the middle finger. No dislocation. 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 IMPRESSION: 1. No evidence of acute fracture or dislocation of the middle finger. The oblique and lateral views partially obscured by overlying splint material. 2. Normal alignment of the fifth digit after previous subluxation, previous small fracture fragments on prior exam not well visualized. Electronically Signed   By: Keith Rake M.D.   On: 05/29/2019 16:17     Assessment & Plan:  Plan  I am having Claris Gower. Engram start on ondansetron. I am also having her maintain her multivitamin, Biotin, vitamin C, vitamin E, metoprolol succinate, Synthroid, dicyclomine, Vitamin D (Ergocalciferol), clopidogrel, Klor-Con M20, furosemide, and Ofev.  Meds ordered this encounter  Medications  . ondansetron (ZOFRAN ODT) 4 MG disintegrating tablet    Sig: Take 1 tablet (4 mg total) by mouth every 8 (eight) hours as needed for nausea or vomiting.    Dispense:  60 tablet    Refill:  2    Problem List Items Addressed This Visit      Unprioritized    Nausea    From olev-----  pulm would like to to stay on it zofran rx for pt --- 4 mg --- she can take 8 mg if needed       Relevant Medications   ondansetron (ZOFRAN ODT) 4 MG disintegrating tablet   Pre-ulcerative corn or callous - Primary    Refer to podiatry       Relevant Orders   Ambulatory referral to Podiatry      Follow-up: Return in about 6 months (around 03/08/2020), or if symptoms worsen or fail to improve.  [name], DO

## 2019-09-07 NOTE — Patient Instructions (Signed)
Nausea, Adult Nausea is the feeling that you have an upset stomach or that you are about to vomit. Nausea on its own is not usually a serious concern, but it may be an early sign of a more serious medical problem. As nausea gets worse, it can lead to vomiting. If vomiting develops, or if you are not able to drink enough fluids, you are at risk of becoming dehydrated. Dehydration can make you tired and thirsty, cause you to have a dry mouth, and decrease how often you urinate. Older adults and people with other diseases or a weak disease-fighting system (immune system) are at higher risk for dehydration. The main goals of treating your nausea are:  To relieve your nausea.  To limit repeated nausea episodes.  To prevent vomiting and dehydration. Follow these instructions at home: Watch your symptoms for any changes. Tell your health care provider about them. Follow these instructions as told by your health care provider. Eating and drinking      Take an oral rehydration solution (ORS). This is a drink that is sold at pharmacies and retail stores.  Drink clear fluids slowly and in small amounts as you are able. Clear fluids include water, ice chips, low-calorie sports drinks, and fruit juice that has water added (diluted fruit juice).  Eat bland, easy-to-digest foods in small amounts as you are able. These foods include bananas, applesauce, rice, lean meats, toast, and crackers.  Avoid drinking fluids that contain a lot of sugar or caffeine, such as energy drinks, sports drinks, and soda.  Avoid alcohol.  Avoid spicy or fatty foods. General instructions  Take over-the-counter and prescription medicines only as told by your health care provider.  Rest at home while you recover.  Drink enough fluid to keep your urine pale yellow.  Breathe slowly and deeply when you feel nauseous.  Avoid smelling things that have strong odors.  Wash your hands often using soap and water. If soap and  water are not available, use hand sanitizer.  Make sure that all people in your household wash their hands well and often.  Keep all follow-up visits as told by your health care provider. This is important. Contact a health care provider if:  Your nausea gets worse.  Your nausea does not go away after two days.  You vomit.  You cannot drink fluids without vomiting.  You have any of the following: ? New symptoms. ? A fever. ? A headache. ? Muscle cramps. ? A rash. ? Pain while urinating.  You feel light-headed or dizzy. Get help right away if:  You have pain in your chest, neck, arm, or jaw.  You feel extremely weak or you faint.  You have vomit that is bright red or looks like coffee grounds.  You have bloody or black stools or stools that look like tar.  You have a severe headache, a stiff neck, or both.  You have severe pain, cramping, or bloating in your abdomen.  You have difficulty breathing or are breathing very quickly.  Your heart is beating very quickly.  Your skin feels cold and clammy.  You feel confused.  You have signs of dehydration, such as: ? Dark urine, very little urine, or no urine. ? Cracked lips. ? Dry mouth. ? Sunken eyes. ? Sleepiness. ? Weakness. These symptoms may represent a serious problem that is an emergency. Do not wait to see if the symptoms will go away. Get medical help right away. Call your local emergency services (911  in the [place] Do not drive yourself to the hospital. Summary  Nausea is the feeling that you have an upset stomach or that you are about to vomit. Nausea on its own is not usually a serious concern, but it may be an early sign of a more serious medical problem.  If vomiting develops, or if you are not able to drink enough fluids, you are at risk of becoming dehydrated.  Follow recommendations for eating and drinking and take over-the-counter and prescription medicines only as told by your health care  provider.  Contact a health care provider right away if your symptoms worsen or you have new symptoms.  Keep all follow-up visits as told by your health care provider. This is important. This information is not intended to replace advice given to you by your health care provider. Make sure you discuss any questions you have with your health care provider. Document Revised: 08/31/2017 Document Reviewed: 08/31/2017 Elsevier Patient Education  [place].

## 2019-09-09 ENCOUNTER — Other Ambulatory Visit: Payer: Self-pay | Admitting: Family Medicine

## 2019-09-09 DIAGNOSIS — E876 Hypokalemia: Secondary | ICD-10-CM

## 2019-09-11 DIAGNOSIS — S63259D Unspecified dislocation of unspecified finger, subsequent encounter: Secondary | ICD-10-CM | POA: Diagnosis not present

## 2019-09-12 ENCOUNTER — Telehealth (INDEPENDENT_AMBULATORY_CARE_PROVIDER_SITE_OTHER): Payer: Medicare Other | Admitting: Family Medicine

## 2019-09-12 ENCOUNTER — Other Ambulatory Visit: Payer: Self-pay

## 2019-09-12 ENCOUNTER — Other Ambulatory Visit: Payer: Self-pay | Admitting: Family Medicine

## 2019-09-12 ENCOUNTER — Other Ambulatory Visit: Payer: Self-pay | Admitting: Cardiovascular Disease

## 2019-09-12 ENCOUNTER — Encounter: Payer: Self-pay | Admitting: Family Medicine

## 2019-09-12 DIAGNOSIS — J0140 Acute pansinusitis, unspecified: Secondary | ICD-10-CM | POA: Diagnosis not present

## 2019-09-12 DIAGNOSIS — I200 Unstable angina: Secondary | ICD-10-CM | POA: Diagnosis not present

## 2019-09-12 MED ORDER — AMOXICILLIN-POT CLAVULANATE 875-125 MG PO TABS: 1.0000 | ORAL_TABLET | Freq: Two times a day (BID) | ORAL | 0 refills | Status: DC

## 2019-09-12 NOTE — Progress Notes (Signed)
Virtual Visit via Video Note  I connected with Alexandria [name] on 09/12/19 at  3:00 PM EDT by a video enabled telemedicine application and verified that I am speaking with the correct person using two identifiers.  Location: Patient: home alone  Provider: office   I discussed the limitations of evaluation and management by telemedicine and the availability of in person appointments. The patient expressed understanding and agreed to proceed.  History of Present Illness: Pt is home c/o sinus congestion , headache,  She is only taking aleve.  No fevers  + chills  X 2 weeks  Observations/Objective: There were no vitals filed for this visit. Pt is in nad   Assessment and Plan: 1. Acute non-recurrent pansinusitis augmentin per orders  flonase / saline spray  Call or rto prn  - amoxicillin-clavulanate (AUGMENTIN) 875-125 MG tablet; Take 1 tablet by mouth 2 (two) times daily.  Dispense: 20 tablet; Refill: 0   Follow Up Instructions:    I discussed the assessment and treatment plan with the patient. The patient was provided an opportunity to ask questions and all were answered. The patient agreed with the plan and demonstrated an understanding of the instructions.   The patient was advised to call back or seek an in-person evaluation if the symptoms worsen or if the condition fails to improve as anticipated.  I provided 30 minutes of non-face-to-face time during this encounter.   [name], DO

## 2019-09-25 ENCOUNTER — Other Ambulatory Visit: Payer: Self-pay | Admitting: Family Medicine

## 2019-09-27 ENCOUNTER — Telehealth: Payer: Self-pay | Admitting: Family Medicine

## 2019-09-27 NOTE — Telephone Encounter (Signed)
Caller: Leianne Call back phone number: [phone]  Patient states she needs a refill on her inhaler.   Pharmacy: [id number] - [place], [place], [place] [address]  Phone:  [phone] Fax:  [phone]

## 2019-09-28 ENCOUNTER — Encounter: Payer: Self-pay | Admitting: Podiatry

## 2019-09-28 ENCOUNTER — Other Ambulatory Visit: Payer: Self-pay | Admitting: Family Medicine

## 2019-09-28 ENCOUNTER — Other Ambulatory Visit: Payer: Self-pay

## 2019-09-28 ENCOUNTER — Ambulatory Visit (INDEPENDENT_AMBULATORY_CARE_PROVIDER_SITE_OTHER): Payer: Medicare Other | Admitting: Podiatry

## 2019-09-28 DIAGNOSIS — I200 Unstable angina: Secondary | ICD-10-CM

## 2019-09-28 DIAGNOSIS — M79676 Pain in unspecified toe(s): Secondary | ICD-10-CM

## 2019-09-28 DIAGNOSIS — J40 Bronchitis, not specified as acute or chronic: Secondary | ICD-10-CM

## 2019-09-28 DIAGNOSIS — L603 Nail dystrophy: Secondary | ICD-10-CM | POA: Diagnosis not present

## 2019-09-28 MED ORDER — ALBUTEROL SULFATE HFA 108 (90 BASE) MCG/ACT IN AERS: 2.0000 | INHALATION_SPRAY | Freq: Four times a day (QID) | RESPIRATORY_TRACT | Status: DC | PRN

## 2019-09-28 NOTE — Telephone Encounter (Signed)
Please advise- I do not see an inhaler on her med list.

## 2019-09-28 NOTE — Telephone Encounter (Signed)
She albuterol in hx so I refilled it

## 2019-09-28 NOTE — Telephone Encounter (Signed)
Patient is calling to check the status of the refill. Patient states she is wheezing. I offer patient an appointment, she decline, stating please send a note to the doctor.

## 2019-09-28 NOTE — Telephone Encounter (Signed)
Noted, thanks!

## 2019-09-29 ENCOUNTER — Other Ambulatory Visit: Payer: Self-pay | Admitting: Acute Care

## 2019-10-01 NOTE — Progress Notes (Signed)
Subjective:  Patient ID: Alexandria [name], female    DOB: 02-May-1945,  MRN: [id number] HPI Chief Complaint  Patient presents with   Nail Problem    Hallux nail left - thick, discolored and misshapen, tries to keep buffed down, also small callused area tip of toe, uses cream to soften   New Patient (Initial Visit)    74 y.o. female presents with the above complaint.   ROS: Denies fever chills nausea vomiting muscle aches pains calf pain back pain chest pain shortness of breath.  Past Medical History:  Diagnosis Date   Arthritis    Asthmatic bronchitis with acute exacerbation 02/06/2015   Bronchial pneumonia    Colon polyp    Diverticulitis    DVT (deep venous thrombosis) (HCC)    lower extremity   H/O cardiac catheterization 2004   Normal coronary arteries   Heart murmur    History of stress test 05/21/2011   Hx of echocardiogram 01/27/2010   Normal Ef 55% the transmitral spectral doppler flow pattern is normal for age. the left ventricular wall motion is normal   IPF (idiopathic pulmonary fibrosis) ([place]) 01/2018   Migraines    Pancreatitis    Thyroid disease    TIA (transient ischemic attack)    8 years ago   Past Surgical History:  Procedure Laterality Date   BREAST BIOPSY     [place]   CARDIAC CATHETERIZATION     10/2013   CATARACT EXTRACTION Right 03/22/2018   LEFT HEART CATHETERIZATION WITH CORONARY ANGIOGRAM N/A 10/25/2013   Procedure: LEFT HEART CATHETERIZATION WITH CORONARY ANGIOGRAM;  Surgeon: Troy Sine, MD;  Location: [facility] CATH LAB;  Service: Cardiovascular;  Laterality: N/A;   NECK SURGERY     x's 2   TOTAL KNEE ARTHROPLASTY     Bilateral x's 2   VAGINAL HYSTERECTOMY  10/17/1998   [name]   VIDEO BRONCHOSCOPY Bilateral 01/24/2018   Procedure: VIDEO BRONCHOSCOPY WITH FLUORO;  Surgeon: [name], MD;  Location: [facility];  Service: Cardiopulmonary;  Laterality: Bilateral;    Current Outpatient Medications:     albuterol (PROAIR HFA) 108 (90 Base) MCG/ACT inhaler, Inhale 2 puffs into the lungs every 6 (six) hours as needed for wheezing or shortness of breath., Disp: , Rfl:    amoxicillin-clavulanate (AUGMENTIN) 875-125 MG tablet, Take 1 tablet by mouth 2 (two) times daily., Disp: 20 tablet, Rfl: 0   Biotin (RA BIOTIN) 1000 MCG tablet, Take 1,000 mcg by mouth 2 (two) times daily. , Disp: , Rfl:    clopidogrel (PLAVIX) 75 MG tablet, TAKE 1 TABLET BY MOUTH EVERY DAY, Disp: 90 tablet, Rfl: 1   dicyclomine (BENTYL) 20 MG tablet, TAKE 1 TABLET BY MOUTH FOUR TIMES A DAY AS NEEDED, Disp: 360 tablet, Rfl: 1   furosemide (LASIX) 40 MG tablet, TAKE 1 TABLET BY MOUTH TWICE A DAY, Disp: 180 tablet, Rfl: 0   KLOR-CON M20 20 MEQ tablet, TAKE 1 TABLET (20 MEQ TOTAL) BY MOUTH 2 (TWO) TIMES DAILY. WILL TAKE 3 A DAILY ONLY AS NEEDED., Disp: 270 tablet, Rfl: 0   metoprolol succinate (TOPROL-XL) 25 MG 24 hr tablet, TAKE 1 TABLET BY MOUTH EVERY DAY, Disp: 90 tablet, Rfl: 1   Multiple Vitamin (MULTIVITAMIN) tablet, Take 1 tablet by mouth every other day. Gluten free/Vegetarian Multivitamin, Disp: , Rfl:    OFEV 150 MG CAPS, TAKE 1 CAPSULE BY MOUTH TWICE DAILY 12 HOURS APART WITH FOOD. SWALLOW WHOLE. CALL [phone] FOR REFILLS, Disp: 60 capsule, Rfl: 4  ondansetron (ZOFRAN ODT) 4 MG disintegrating tablet, Take 1 tablet (4 mg total) by mouth every 8 (eight) hours as needed for nausea or vomiting., Disp: 60 tablet, Rfl: 2   SYNTHROID 112 MCG tablet, TAKE 1 TABLET BY MOUTH EVERY DAY BEFORE BREAKFAST, Disp: 90 tablet, Rfl: 1   vitamin C (ASCORBIC ACID) 500 MG tablet, Take 1,000 mg by mouth 2 (two) times daily. , Disp: , Rfl:    Vitamin D, Ergocalciferol, (DRISDOL) 1.25 MG (50000 UNIT) CAPS capsule, Take 1 capsule (50,000 Units total) by mouth every 7 (seven) days. Take for 8 total doses(weeks), Disp: 8 capsule, Rfl: 0   vitamin E 1000 UNIT capsule, Take 1,000 Units by mouth daily., Disp: , Rfl:   Allergies  Allergen  Reactions   Prednisone Other (See Comments)    Changed personality    Atrovent Hfa [Ipratropium Bromide Hfa] Other (See Comments)    Pt could not sleep   Cheratussin Ac [Guaifenesin-Codeine]    Codeine Other (See Comments)    Pt takes promethazine with codeine at home   Hydrocodone Itching   Influenza Vaccines Other (See Comments)    Pt reports heart attack after last flu shot   Motrin [Ibuprofen] Other (See Comments)    "Gives false reading in blood"   Oxycodone-Acetaminophen Itching   Review of Systems Objective:  There were no vitals filed for this visit.  General: Well developed, nourished, in no acute distress, alert and oriented x3   Dermatological: Skin is warm, dry and supple bilateral. Nails x 10 are well maintained; remaining integument appears unremarkable at this time. There are no open sores, no preulcerative lesions, no rash or signs of infection present.  Dystrophic distorted hallux nail left with a distal clavus to the hallux left.  No open lesions or wounds noted.  Vascular: Dorsalis Pedis artery and Posterior Tibial artery pedal pulses are 2/4 bilateral with immedate capillary fill time. Pedal hair growth present. No varicosities and no lower extremity edema present bilateral.   Neruologic: Grossly intact via light touch bilateral. Vibratory intact via tuning fork bilateral. Protective threshold with Semmes Wienstein monofilament intact to all pedal sites bilateral. Patellar and Achilles deep tendon reflexes 2+ bilateral. No Babinski or clonus noted bilateral.   Musculoskeletal: No gross boney pedal deformities bilateral. No pain, crepitus, or limitation noted with foot and ankle range of motion bilateral. Muscular strength 5/5 in all groups tested bilateral.  Gait: Unassisted, Nonantalgic.    Radiographs:  None taken  Assessment & Plan:   Assessment: Distal clavus nail dystrophy hallux left  Plan: She has such severe pain in her skin due to her  illnesses I am unable to trim anything so was able to buffet down with the dermal today this did allow for some relief.  I will follow-up with her as needed     Tae Vonada T. [place], [place]

## 2019-10-04 ENCOUNTER — Telehealth: Payer: Self-pay | Admitting: Internal Medicine

## 2019-10-04 NOTE — Telephone Encounter (Signed)
Attempted to call pt but line went straight to VM. Left message for pt to return call. 

## 2019-10-05 ENCOUNTER — Telehealth (HOSPITAL_COMMUNITY): Payer: Self-pay | Admitting: *Deleted

## 2019-10-05 NOTE — Telephone Encounter (Signed)
Patient called requesting a refill of her benzonatate.  She states she just has an occasional cough and it helps.  She states Dr. Chase Caller told her to call if she needed it.  Dr. Chase Caller, please advise if you would like this medication refilled.  Thank you.

## 2019-10-05 NOTE — Telephone Encounter (Signed)
Patient is returning phone call. Patient phone number is [phone].

## 2019-10-05 NOTE — Telephone Encounter (Signed)
Reaching out to patient to offer assistance regarding upcoming cardiac imaging study; pt verbalizes understanding of appt date/time, parking situation and where to check in, pre-test NPO status and medications ordered, and verified current allergies; name and call back number provided for further questions should they arise   Tai RN Navigator [facility] [phone] office [phone] cell 

## 2019-10-06 ENCOUNTER — Other Ambulatory Visit: Payer: Self-pay

## 2019-10-06 ENCOUNTER — Ambulatory Visit (HOSPITAL_COMMUNITY)
Admission: RE | Admit: 2019-10-06 | Discharge: 2019-10-06 | Disposition: A | Discharge status: Home / Self Care | Payer: Medicare Other | Source: Ambulatory Visit | Attending: Cardiology | Admitting: Cardiology

## 2019-10-06 DIAGNOSIS — I700 Atherosclerosis of aorta: Secondary | ICD-10-CM | POA: Insufficient documentation

## 2019-10-06 DIAGNOSIS — I77819 Aortic ectasia, unspecified site: Secondary | ICD-10-CM | POA: Insufficient documentation

## 2019-10-06 DIAGNOSIS — R0600 Dyspnea, unspecified: Secondary | ICD-10-CM | POA: Diagnosis not present

## 2019-10-06 DIAGNOSIS — R0609 Other forms of dyspnea: Secondary | ICD-10-CM

## 2019-10-06 MED ORDER — METOPROLOL TARTRATE 5 MG/5ML IV SOLN
INTRAVENOUS | Status: AC
  Filled 2019-10-06: qty 5

## 2019-10-06 MED ORDER — METOPROLOL TARTRATE 5 MG/5ML IV SOLN: 5.0000 mg | INTRAVENOUS | Status: DC | PRN

## 2019-10-06 MED ORDER — IOHEXOL 350 MG/ML SOLN
80.0000 mL | Freq: Once | INTRAVENOUS | Status: AC | PRN
  Administered 2019-10-06: 80 mL via INTRAVENOUS

## 2019-10-06 MED ORDER — BENZONATATE 100 MG PO CAPS
100.0000 mg | ORAL_CAPSULE | Freq: Three times a day (TID) | ORAL | 6 refills | Status: DC | PRN
Start: 2019-10-06 — End: 2020-06-05

## 2019-10-06 MED ORDER — NITROGLYCERIN 0.4 MG SL SUBL
SUBLINGUAL_TABLET | SUBLINGUAL | Status: AC
  Filled 2019-10-06: qty 2

## 2019-10-06 MED ORDER — NITROGLYCERIN 0.4 MG SL SUBL
0.8000 mg | SUBLINGUAL_TABLET | Freq: Once | SUBLINGUAL | Status: AC
  Administered 2019-10-06: 0.8 mg via SUBLINGUAL

## 2019-10-06 NOTE — Telephone Encounter (Signed)
Pt calling back needing benzonatate refilled.  Needs today.  Please advise.  [phone]

## 2019-10-06 NOTE — Telephone Encounter (Signed)
  Sending to [facility] Triage   benzonatate (TESSALON) 100 MG capsule, TAKE 1 CAPSULE BY MOUTH 3 TIMES A DAY AS NEEDED FOR COUGH, Disp: 90 capsule, Rfl: 6

## 2019-10-06 NOTE — Telephone Encounter (Signed)
Rx sent to preferred pharmacy for pt. Called and spoke with pt letting her know this had been done and she verbalized understanding. Nothing further needed. 

## 2019-10-06 NOTE — Telephone Encounter (Signed)
Dr. Chase Caller, I did not have any forms on this patient, as she is a Public relations account executive patient.

## 2019-10-06 NOTE — Telephone Encounter (Signed)
Dr. Chase Caller, Patient is calling back regarding the refill for Benzonatate.  Please advise on if we can refill for her.  Thank you.

## 2019-10-06 NOTE — Telephone Encounter (Signed)
This was sent to Dr. Chase Caller again today, he told Lesleigh Noe that he would take care of it after clinic in [place] today.

## 2019-10-06 NOTE — Telephone Encounter (Signed)
Patient checking on [place] refill. Patient phone number is [phone].

## 2019-10-06 NOTE — Telephone Encounter (Signed)
I believe I signed it as I was leaving the [place] office this afternoon. Please double check with Margie. Also please make sure there is a follow-up based on most recent visit instructions thank you

## 2019-10-10 ENCOUNTER — Telehealth: Payer: Self-pay

## 2019-10-10 NOTE — Telephone Encounter (Signed)
-----   Message from Berniece Salines, DO sent at 10/06/2019 11:06 PM EDT ----- Calcium score 0.  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  We will plan for repeat CT scan to assess this in 1 year.

## 2019-10-10 NOTE — Telephone Encounter (Signed)
Spoke with patient regarding results and recommendation.  Patient verbalizes understanding and is agreeable to plan of care. Advised patient to call back with any issues or concerns.  

## 2019-10-10 NOTE — Telephone Encounter (Signed)
Left message on patients voicemail to please return our call.   

## 2019-10-10 NOTE — Telephone Encounter (Signed)
Follow up   Patient is returning call for test results. Please call.

## 2019-10-13 DIAGNOSIS — R109 Unspecified abdominal pain: Secondary | ICD-10-CM | POA: Diagnosis not present

## 2019-10-16 DIAGNOSIS — R1110 Vomiting, unspecified: Secondary | ICD-10-CM | POA: Diagnosis not present

## 2019-10-16 DIAGNOSIS — R197 Diarrhea, unspecified: Secondary | ICD-10-CM | POA: Diagnosis not present

## 2019-10-18 ENCOUNTER — Telehealth: Payer: Self-pay | Admitting: Family Medicine

## 2019-10-18 NOTE — Telephone Encounter (Signed)
Patient would like to know if Dr Etter Sjogren has received any information form her Gi doctor in regards to stopping Plavix for procedure

## 2019-10-18 NOTE — Telephone Encounter (Signed)
Spoke with patient. Pt advised we have received forms. Pt states she was told that she wasn't sure if Alexandria [name] or Dr. Harriet Masson was supposed to fill out. Dr. Harriet Masson received a copy as well.

## 2019-10-19 NOTE — Telephone Encounter (Signed)
Procedure request faxed back

## 2019-10-19 NOTE — Telephone Encounter (Signed)
I filled it out

## 2019-10-23 ENCOUNTER — Telehealth: Payer: Self-pay | Admitting: Family Medicine

## 2019-10-23 NOTE — Telephone Encounter (Signed)
GI issues for weeks and is having trouble today with dirrhhea and would like to know if she can be worked in for a VV. Currently 131 lbs is down about 6 lbs. GI doc said nothing was wrong but does not agree.

## 2019-10-23 NOTE — Telephone Encounter (Signed)
Tried calling patient, no answer. Left detailed message for patient to go to UC or ER to be evaluated if she is having these symptoms. Unfortunately theres nothing PCP could do today or virtually for this concern.

## 2019-10-23 NOTE — Telephone Encounter (Signed)
We can try 1120-- but my schedule is so full----- honestly I don't know what i'll be able to do in a virtual-- if it is that bad she may need to go to ER

## 2019-10-26 ENCOUNTER — Telehealth: Payer: Self-pay | Admitting: Family Medicine

## 2019-10-26 ENCOUNTER — Telehealth: Payer: Self-pay | Admitting: Cardiology

## 2019-10-26 NOTE — Telephone Encounter (Signed)
It is okay for her to get her EGD/colonoscopy.  She can hold Plavix for 5 days ask her where she want [place] to send this clearance and if they can give [place] the clearance form./

## 2019-10-26 NOTE — Telephone Encounter (Signed)
I have refaxed for to GI. Patient aware and appreciative. Extra copy is in my "faxed" folder on my desk until procedure is complete.

## 2019-10-26 NOTE — Telephone Encounter (Signed)
Patient calling in regards to an approval for a upper GI and colonoscopy that was to be faxed to her GI doctor.

## 2019-10-26 NOTE — Telephone Encounter (Signed)
Spoke to the patient just now and let her know Dr. Terrial Rhodes recommendations. She verbalizes understanding and states that she will have the office send [place] the clearance forms to fill out. No other issues or concerns were noted at this time.    Encouraged patient to call back with any questions or concerns.

## 2019-10-26 NOTE — Telephone Encounter (Signed)
Patient states that [facility] sent Dr.Lowne documentation to be signed. Patient states form was sent on July 13,2021.   Please Advise   Fax Number # [phone]

## 2019-10-26 NOTE — Telephone Encounter (Signed)
Patient returning call.

## 2019-10-26 NOTE — Telephone Encounter (Signed)
Left message on patients voicemail to please return our call.   

## 2019-10-27 ENCOUNTER — Telehealth: Payer: Self-pay

## 2019-10-27 NOTE — Telephone Encounter (Signed)
   [facility] Pre-operative Risk Assessment    HEARTCARE STAFF: - Please ensure there is not already an duplicate clearance open for this procedure. - Under Visit Info/Reason for Call, type in Other and utilize the format Clearance MM/DD/YY or Clearance TBD. Do not use dashes or single digits. - If request is for dental extraction, please clarify the # of teeth to be extracted.  Request for surgical clearance:  1. What type of surgery is being performed? Colonoscopy/EGD   2. When is this surgery scheduled? TBD   3. What type of clearance is required (medical clearance vs. Pharmacy clearance to hold med vs. Both)? Pharmacy  4. Are there any medications that need to be held prior to surgery and how long? Plavix 5-7 days prior to procedure   5. Practice name and name of physician performing surgery? [facility]- Dr. Rolm Bookbinder   6. What is the office phone number? [phone]   7.   What is the office fax number? [phone]  8.   Anesthesia type (None, local, MAC, general) ? N/A   Lowella Grip 10/27/2019, 12:41 PM  _________________________________________________________________   (provider comments below)

## 2019-10-27 NOTE — Telephone Encounter (Signed)
   Primary Cardiologist: Berniece Salines, DO  Chart reviewed as part of pre-operative protocol coverage. Given past medical history and time since last visit, based on ACC/AHA guidelines, Alexandria [name] would be at acceptable risk for the planned procedure without further cardiovascular testing.   Her Plavix may be held for 5 days prior to her procedure.  Please resume as soon as hemostasis is achieved.  I will route this recommendation to the requesting party via Epic fax function and remove from pre-op pool.  Please call with questions.  Jossie Ng. Legacie Dillingham NP-C    10/27/2019, 12:56 PM [place] [address] Office [phone] Fax [phone]

## 2019-10-30 DIAGNOSIS — R29898 Other symptoms and signs involving the musculoskeletal system: Secondary | ICD-10-CM | POA: Diagnosis not present

## 2019-10-30 DIAGNOSIS — S63259D Unspecified dislocation of unspecified finger, subsequent encounter: Secondary | ICD-10-CM | POA: Diagnosis not present

## 2019-11-02 ENCOUNTER — Encounter (HOSPITAL_BASED_OUTPATIENT_CLINIC_OR_DEPARTMENT_OTHER): Payer: Self-pay | Admitting: Emergency Medicine

## 2019-11-02 ENCOUNTER — Telehealth (INDEPENDENT_AMBULATORY_CARE_PROVIDER_SITE_OTHER): Payer: Medicare Other | Admitting: Family

## 2019-11-02 ENCOUNTER — Telehealth: Payer: Self-pay | Admitting: Gastroenterology

## 2019-11-02 ENCOUNTER — Other Ambulatory Visit: Payer: Self-pay

## 2019-11-02 ENCOUNTER — Emergency Department (HOSPITAL_BASED_OUTPATIENT_CLINIC_OR_DEPARTMENT_OTHER)
Admission: EM | Admit: 2019-11-02 | Discharge: 2019-11-02 | Disposition: A | Discharge status: Home / Self Care | Payer: Medicare Other | Attending: Emergency Medicine | Admitting: Emergency Medicine

## 2019-11-02 ENCOUNTER — Telehealth: Payer: Self-pay | Admitting: Family

## 2019-11-02 DIAGNOSIS — I200 Unstable angina: Secondary | ICD-10-CM | POA: Diagnosis not present

## 2019-11-02 DIAGNOSIS — J441 Chronic obstructive pulmonary disease with (acute) exacerbation: Secondary | ICD-10-CM | POA: Insufficient documentation

## 2019-11-02 DIAGNOSIS — R509 Fever, unspecified: Secondary | ICD-10-CM | POA: Diagnosis not present

## 2019-11-02 DIAGNOSIS — D72829 Elevated white blood cell count, unspecified: Secondary | ICD-10-CM | POA: Diagnosis not present

## 2019-11-02 DIAGNOSIS — Z7951 Long term (current) use of inhaled steroids: Secondary | ICD-10-CM | POA: Diagnosis not present

## 2019-11-02 DIAGNOSIS — Z20822 Contact with and (suspected) exposure to covid-19: Secondary | ICD-10-CM | POA: Insufficient documentation

## 2019-11-02 DIAGNOSIS — Z87891 Personal history of nicotine dependence: Secondary | ICD-10-CM | POA: Insufficient documentation

## 2019-11-02 LAB — BASIC METABOLIC PANEL
Anion gap: 15 (ref 5–15)
BUN: 15 mg/dL (ref 8–23)
CO2: 27 mmol/L (ref 22–32)
Calcium: 9 mg/dL (ref 8.9–10.3)
Chloride: 94 mmol/L — ABNORMAL LOW (ref 98–111)
Creatinine, Ser: 0.99 mg/dL (ref 0.44–1.00)
GFR calc Af Amer: >60 mL/min (ref >60)
GFR calc non Af Amer: 56 mL/min — ABNORMAL LOW (ref >60)
Glucose, Bld: 131 mg/dL — ABNORMAL HIGH (ref 70–99)
Potassium: 3.6 mmol/L (ref 3.5–5.1)
Sodium: 136 mmol/L (ref 135–145)

## 2019-11-02 LAB — CBC WITH DIFFERENTIAL/PLATELET
Abs Immature Granulocytes: 0.08 10*3/uL — ABNORMAL HIGH (ref 0.00–0.07)
Basophils Absolute: 0.1 10*3/uL (ref 0.0–0.1)
Basophils Relative: 1 %
Eosinophils Absolute: 0.4 10*3/uL (ref 0.0–0.5)
Eosinophils Relative: 3 %
HCT: 40.2 % (ref 36.0–46.0)
Hemoglobin: 13.8 g/dL (ref 12.0–15.0)
Immature Granulocytes: 1 %
Lymphocytes Relative: 5 %
Lymphs Abs: 0.8 10*3/uL (ref 0.7–4.0)
MCH: 32.9 pg (ref 26.0–34.0)
MCHC: 34.3 g/dL (ref 30.0–36.0)
MCV: 95.7 fL (ref 80.0–100.0)
Monocytes Absolute: 1.3 10*3/uL — ABNORMAL HIGH (ref 0.1–1.0)
Monocytes Relative: 9 %
Neutro Abs: 12.5 10*3/uL — ABNORMAL HIGH (ref 1.7–7.7)
Neutrophils Relative %: 81 %
Platelets: 244 10*3/uL (ref 150–400)
RBC: 4.2 MIL/uL (ref 3.87–5.11)
RDW: 13.1 % (ref 11.5–15.5)
WBC: 15.1 10*3/uL — ABNORMAL HIGH (ref 4.0–10.5)
nRBC: 0 % (ref 0.0–0.2)

## 2019-11-02 LAB — URINALYSIS, ROUTINE W REFLEX MICROSCOPIC
Bilirubin Urine: NEGATIVE
Glucose, UA: NEGATIVE mg/dL
Hgb urine dipstick: NEGATIVE
Ketones, ur: NEGATIVE mg/dL
Nitrite: NEGATIVE
Protein, ur: NEGATIVE mg/dL
Specific Gravity, Urine: 1.025 (ref 1.005–1.030)
pH: 5 (ref 5.0–8.0)

## 2019-11-02 LAB — URINALYSIS, MICROSCOPIC (REFLEX)

## 2019-11-02 LAB — SARS CORONAVIRUS 2 BY RT PCR (HOSPITAL ORDER, PERFORMED IN ~~LOC~~ HOSPITAL LAB): SARS Coronavirus 2: NEGATIVE

## 2019-11-02 MED ORDER — CEPHALEXIN 500 MG PO CAPS
500.0000 mg | ORAL_CAPSULE | Freq: Three times a day (TID) | ORAL | 0 refills | Status: DC
Start: 2019-11-02 — End: 2019-12-25

## 2019-11-02 NOTE — Telephone Encounter (Signed)
Informed pt of Dr Loletha Carrow message, pt agreed.

## 2019-11-02 NOTE — Telephone Encounter (Signed)
This patient is in the midst of testing with her current local gastroenterologist.  Therefore, I respectfully decline to accept the requested urgent transfer of care and highly recommend that she contact her GI provider for further evaluation if she is still having difficulty.

## 2019-11-02 NOTE — Telephone Encounter (Signed)
Hey Dr.Danis, This patient is calling to get an appointment at our office. States that she is having reflux issues and trouble swallowing. She did just seen a GI doctor a couple weeks ago but states she would like to switch to [place]. Records are in epic, please review and advise on scheduling. Thank you!

## 2019-11-02 NOTE — ED Provider Notes (Signed)
[facility] DEPT [facility] Provider Note: Georgena Spurling, MD, FACEP  CSN: [id number] MRN: [id number] ARRIVAL: 11/02/19 at [place]: [place]  Fever   HISTORY OF PRESENT ILLNESS  11/02/19 4:23 AM Alexandria [name] is a 74 y.o. female who developed a subjective fever and chills 3 to 4 hours ago.  She does not know if she had a fever because she does not have a thermometer but her hands were sweaty.  She took Aleve about 2 AM.  On arrival her temperature was noted to be 99.4 degrees.  She denies headache, nasal congestion, sore throat, cough, shortness of breath, chest pain, abdominal pain, nausea, vomiting or dysuria.  She has had intermittent diarrhea for which she is seeing a gastroenterologist.   Past Medical History:  Diagnosis Date  . Arthritis   . Asthmatic bronchitis with acute exacerbation 02/06/2015  . Bronchial pneumonia   . Colon polyp   . Diverticulitis   . DVT (deep venous thrombosis) ([place])    lower extremity  . H/O cardiac catheterization 2004   Normal coronary arteries  . Heart murmur   . History of stress test 05/21/2011  . Hx of echocardiogram 01/27/2010   Normal Ef 55% the transmitral spectral doppler flow pattern is normal for age. the left ventricular wall motion is normal  . IPF (idiopathic pulmonary fibrosis) ([place]) 01/2018  . Migraines   . Pancreatitis   . Thyroid disease   . TIA (transient ischemic attack)    8 years ago    Past Surgical History:  Procedure Laterality Date  . BREAST BIOPSY     [facility]  . CARDIAC CATHETERIZATION     10/2013  . CATARACT EXTRACTION Right 03/22/2018  . LEFT HEART CATHETERIZATION WITH CORONARY ANGIOGRAM N/A 10/25/2013   Procedure: LEFT HEART CATHETERIZATION WITH CORONARY ANGIOGRAM;  Surgeon: Troy Sine, MD;  Location: [facility] CATH LAB;  Service: Cardiovascular;  Laterality: N/A;  . NECK SURGERY     x's 2  . TOTAL KNEE ARTHROPLASTY     Bilateral x's 2  . VAGINAL HYSTERECTOMY  10/17/1998   [facility] BRONCHOSCOPY Bilateral 01/24/2018   Procedure: VIDEO BRONCHOSCOPY WITH FLUORO;  Surgeon: [name], MD;  Location: [facility];  Service: Cardiopulmonary;  Laterality: Bilateral;    Family History  Problem Relation Age of Onset  . Arthritis Other   . Colon cancer Father   . Prostate cancer Father   . HIV Brother        19  . Kidney cancer Brother   . Colon cancer Brother   . Lung cancer Brother   . Other Brother        Mouth Cancer  . Ovarian cancer Mother   . Uterine cancer Mother   . Heart disease Maternal Grandmother   . Stroke Maternal Grandmother   . Hypertension Maternal Grandmother   . Diabetes Maternal Grandmother     Social History   Tobacco Use  . Smoking status: Former Smoker    Years: 2.00    Quit date: 04/06/1969    Years since quitting: 50.6  . Smokeless tobacco: Never Used  . Tobacco comment: socially smoked x 2 years  Vaping Use  . Vaping Use: Never used  Substance Use Topics  . Alcohol use: No    Alcohol/week: 0.0 standard drinks  . Drug use: No    Prior to Admission medications   Medication Sig Start Date End Date Taking? Authorizing Provider  albuterol (  PROAIR HFA) 108 (90 Base) MCG/ACT inhaler Inhale 2 puffs into the lungs every 6 (six) hours as needed for wheezing or shortness of breath. 09/28/19   Carollee Herter, Alferd Apa, DO  amoxicillin-clavulanate (AUGMENTIN) 875-125 MG tablet Take 1 tablet by mouth 2 (two) times daily. 09/12/19   [name], DO  benzonatate (TESSALON) 100 MG capsule Take 1 capsule (100 mg total) by mouth 3 (three) times daily as needed for cough. 10/06/19   Brand Males, MD  Biotin (RA BIOTIN) 1000 MCG tablet Take 1,000 mcg by mouth 2 (two) times daily.     [provider]  clopidogrel (PLAVIX) 75 MG tablet TAKE 1 TABLET BY MOUTH EVERY DAY 06/05/19   Carollee Herter, Yvonne R, DO  dicyclomine (BENTYL) 20 MG tablet TAKE 1 TABLET BY MOUTH FOUR TIMES A DAY AS NEEDED 04/11/19   Carollee Herter, Kendrick Fries R,  DO  furosemide (LASIX) 40 MG tablet TAKE 1 TABLET BY MOUTH TWICE A DAY 09/13/19   Lowne Chase, Yvonne R, DO  KLOR-CON M20 20 MEQ tablet TAKE 1 TABLET (20 MEQ TOTAL) BY MOUTH 2 (TWO) TIMES DAILY. WILL TAKE 3 A DAILY ONLY AS NEEDED. 09/11/19   Carollee Herter, Alferd Apa, DO  metoprolol succinate (TOPROL-XL) 25 MG 24 hr tablet TAKE 1 TABLET BY MOUTH EVERY DAY 09/14/19   Tobb, Kardie, DO  Multiple Vitamin (MULTIVITAMIN) tablet Take 1 tablet by mouth every other day. Gluten free/Vegetarian Multivitamin    [provider]  OFEV 150 MG CAPS TAKE 1 CAPSULE BY MOUTH TWICE DAILY 12 HOURS APART WITH FOOD. SWALLOW WHOLE. CALL [phone] FOR REFILLS 07/12/19   Brand Males, MD  ondansetron (ZOFRAN ODT) 4 MG disintegrating tablet Take 1 tablet (4 mg total) by mouth every 8 (eight) hours as needed for nausea or vomiting. 09/07/19   [name], DO  SYNTHROID 112 MCG tablet TAKE 1 TABLET BY MOUTH EVERY DAY BEFORE BREAKFAST 09/26/19   Carollee Herter, Alferd Apa, DO  vitamin C (ASCORBIC ACID) 500 MG tablet Take 1,000 mg by mouth 2 (two) times daily.     [provider]  Vitamin D, Ergocalciferol, (DRISDOL) 1.25 MG (50000 UNIT) CAPS capsule Take 1 capsule (50,000 Units total) by mouth every 7 (seven) days. Take for 8 total doses(weeks) 05/10/19   Rosemarie Ax, MD  vitamin E 1000 UNIT capsule Take 1,000 Units by mouth daily.    [provider]    Allergies Prednisone, Atrovent hfa [ipratropium bromide hfa], Cheratussin ac [guaifenesin-codeine], Codeine, Hydrocodone, Influenza vaccines, Motrin [ibuprofen], and Oxycodone-acetaminophen   REVIEW OF SYSTEMS  Negative except as noted here or in the History of Present Illness.   PHYSICAL EXAMINATION  Initial Vital Signs Blood pressure (!) 153/78, pulse 96, temperature 99.4 F (37.4 C), temperature source Oral, resp. rate 18, height 5\' 4"  (1.626 m), weight 57.6 kg, SpO2 96 %.  Examination General: Well-developed, well-nourished female in no  acute distress; appearance consistent with age of record HENT: normocephalic; atraumatic Eyes: pupils equal, round and reactive to light; extraocular muscles intact; right pseudophakia Neck: supple Heart: regular rate and rhythm Lungs: clear to auscultation bilaterally Abdomen: soft; nondistended; nontender; no masses or hepatosplenomegaly; bowel sounds present Extremities: No deformity; full range of motion; pulses normal; no edema Neurologic: Awake, alert and oriented; motor function intact in all extremities and symmetric; no facial droop Skin: Warm and dry Psychiatric: Flat affect   RESULTS  Summary of this visit's results, reviewed and interpreted by myself:   EKG Interpretation  Date/Time:    Ventricular Rate:    PR Interval:    QRS Duration:   QT Interval:    QTC Calculation:   R Axis:     Text Interpretation:        Laboratory Studies: Results for orders placed or performed during the hospital encounter of 11/02/19 (from the past 24 hour(s))  Urinalysis, Routine w reflex microscopic     Status: Abnormal   Collection Time: 11/02/19  4:22 AM  Result Value Ref Range   Color, Urine YELLOW YELLOW   APPearance CLEAR CLEAR   Specific Gravity, Urine 1.025 1.005 - 1.030   pH 5.0 5.0 - 8.0   Glucose, UA NEGATIVE NEGATIVE mg/dL   Hgb urine dipstick NEGATIVE NEGATIVE   Bilirubin Urine NEGATIVE NEGATIVE   Ketones, ur NEGATIVE NEGATIVE mg/dL   Protein, ur NEGATIVE NEGATIVE mg/dL   Nitrite NEGATIVE NEGATIVE   Leukocytes,Ua TRACE (A) NEGATIVE  Urinalysis, Microscopic (reflex)     Status: Abnormal   Collection Time: 11/02/19  4:22 AM  Result Value Ref Range   RBC / HPF 0-5 0 - 5 RBC/hpf   WBC, UA 0-5 0 - 5 WBC/hpf   Bacteria, UA FEW (A) NONE SEEN   Squamous Epithelial / LPF 0-5 0 - 5  CBC with Differential/Platelet     Status: Abnormal   Collection Time: 11/02/19  4:35 AM  Result Value Ref Range   WBC 15.1 (H) 4.0 - 10.5 K/uL   RBC 4.20 3.87 - 5.11 MIL/uL   Hemoglobin  13.8 12.0 - 15.0 g/dL   HCT 40.2 36 - 46 %   MCV 95.7 80.0 - 100.0 fL   MCH 32.9 26.0 - 34.0 pg   MCHC 34.3 30.0 - 36.0 g/dL   RDW 13.1 11.5 - 15.5 %   Platelets 244 150 - 400 K/uL   nRBC 0.0 0.0 - 0.2 %   Neutrophils Relative % 81 %   Neutro Abs 12.5 (H) 1.7 - 7.7 K/uL   Lymphocytes Relative 5 %   Lymphs Abs 0.8 0.7 - 4.0 K/uL   Monocytes Relative 9 %   Monocytes Absolute 1.3 (H) 0 - 1 K/uL   Eosinophils Relative 3 %   Eosinophils Absolute 0.4 0 - 0 K/uL   Basophils Relative 1 %   Basophils Absolute 0.1 0 - 0 K/uL   Immature Granulocytes 1 %   Abs Immature Granulocytes 0.08 (H) 0.00 - 0.07 K/uL  Basic metabolic panel     Status: Abnormal   Collection Time: 11/02/19  4:35 AM  Result Value Ref Range   Sodium 136 135 - 145 mmol/L   Potassium 3.6 3.5 - 5.1 mmol/L   Chloride 94 (L) 98 - 111 mmol/L   CO2 27 22 - 32 mmol/L   Glucose, Bld 131 (H) 70 - 99 mg/dL   BUN 15 8 - 23 mg/dL   Creatinine, Ser 0.99 0.44 - 1.00 mg/dL   Calcium 9.0 8.9 - 10.3 mg/dL   GFR calc non Af Amer 56 (L) >60 mL/min   GFR calc Af Amer >60 >60 mL/min   Anion gap 15 5 - 15   Imaging Studies: No results found.  ED COURSE and MDM  Nursing notes, initial and subsequent vitals signs, including pulse oximetry, reviewed and interpreted by myself.  Vitals:   11/02/19 0415  BP: (!) 153/78  Pulse: 96  Resp: 18  Temp: 99.4 F (37.4 C)  TempSrc: Oral  SpO2: 96%  Weight: 57.6 kg  Height: 5'  4" (1.626 m)   Medications - No data to display  5:08 AM No evidence of urinary tract infection and patient symptomatology is not pointing in the direction of any specific organ system.  This may be an early viral illness and a Covid swab has been sent.  She was advised to follow-up with her primary care physician or return to the ED if symptoms are worsening.  PROCEDURES  Procedures   ED DIAGNOSES     ICD-10-CM   1. Acute febrile illness  R50.9        Marcile Fuquay, Jenny Reichmann, MD 11/02/19 [phone]

## 2019-11-02 NOTE — ED Triage Notes (Signed)
Pt is currently seeing GI for on going vomiting and diarrhea. Pt is awaiting colonoscopy and endoscopy.

## 2019-11-02 NOTE — Telephone Encounter (Signed)
Left message for patient to call back  

## 2019-11-02 NOTE — Progress Notes (Signed)
Virtual Visit via Video Note  I connected with Alexandria [name] on 11/02/19 at  3:40 PM EDT by a video enabled telemedicine application and verified that I am speaking with the correct person using two identifiers.  Location: Patient: home Provider: office   I discussed the limitations of evaluation and management by telemedicine and the availability of in person appointments. The patient expressed understanding and agreed to proceed. Only the patient and myself were present for today's video call.   History of Present Illness:  Patient is a 74 year old female who presents today with chief complaint of diarrhea and weakness.  Diarrhea has been present for several months and she is following with gastroenterology for this.  She had a negative C. difficile test earlier this month.  She has a colonoscopy scheduled in August.  She presented to the emergency department earlier today with subjective fever and chills. She did not take her temperature at home because she did not have a thermometer. Temperature in the emergency department was noted to be 99.4 degrees. ED record is reviewed.   ED work-up included a COVID-19 test which was negative.  A CBC which noted a white count of 15.1, and a urinalysis which noted trace leukocytes.  She denies cough, vomiting, dysuria, or abdominal pain.  She only notes some lower abdominal cramping with diarrhea.     Observations/Objective:   Gen: Awake, alert, no acute distress Resp: Breathing is even and non-labored Psych: calm/pleasant demeanor Neuro: Alert and Oriented x 3, + facial symmetry, speech is clear.   Assessment and Plan:  Leukocytosis-etiology is unclear.  Given finding of trace leukocytes in her urine, will plan empiric treatment with Keflex for possible UTI.  We have arranged close follow-up with her PCP early next week.  She is advised that should she develop increased weakness, fever greater than 101, she should return to the  emergency department.  30 minutes spent on today's visit.  Time was spent reviewing patient record, counseling patient, coordinating follow-up, and formulating medical plan.  Follow Up Instructions:    I discussed the assessment and treatment plan with the patient. The patient was provided an opportunity to ask questions and all were answered. The patient agreed with the plan and demonstrated an understanding of the instructions.   The patient was advised to call back or seek an in-person evaluation if the symptoms worsen or if the condition fails to improve as anticipated.  Nance Pear, NP

## 2019-11-02 NOTE — ED Triage Notes (Signed)
Pt c/o subjective fever at home and sweating. Pt aleve around 0200.

## 2019-11-02 NOTE — Telephone Encounter (Signed)
Pt advised that rx a has been sent to her pharmacy.

## 2019-11-06 ENCOUNTER — Encounter: Payer: Self-pay | Admitting: Family Medicine

## 2019-11-06 ENCOUNTER — Other Ambulatory Visit: Payer: Self-pay

## 2019-11-06 ENCOUNTER — Ambulatory Visit (INDEPENDENT_AMBULATORY_CARE_PROVIDER_SITE_OTHER): Payer: Medicare Other | Admitting: Family Medicine

## 2019-11-06 VITALS — BP 128/78 | HR 83 | Temp 97.8°F | Resp 18 | Ht 64.0 in | Wt 128.8 lb

## 2019-11-06 DIAGNOSIS — D72829 Elevated white blood cell count, unspecified: Secondary | ICD-10-CM | POA: Diagnosis not present

## 2019-11-06 DIAGNOSIS — I200 Unstable angina: Secondary | ICD-10-CM

## 2019-11-06 DIAGNOSIS — R1084 Generalized abdominal pain: Secondary | ICD-10-CM

## 2019-11-06 DIAGNOSIS — R197 Diarrhea, unspecified: Secondary | ICD-10-CM

## 2019-11-06 LAB — POC URINALSYSI DIPSTICK (AUTOMATED)
Bilirubin, UA: NEGATIVE
Blood, UA: NEGATIVE
Glucose, UA: NEGATIVE
Ketones, UA: NEGATIVE
Leukocytes, UA: NEGATIVE
Nitrite, UA: NEGATIVE
Protein, UA: NEGATIVE
Spec Grav, UA: 1.01 (ref 1.010–1.025)
Urobilinogen, UA: 0.2 E.U./dL
pH, UA: 6.5 (ref 5.0–8.0)

## 2019-11-06 LAB — COMPREHENSIVE METABOLIC PANEL
AG Ratio: 1.2 (calc) (ref 1.0–2.5)
ALT: 36 U/L — ABNORMAL HIGH (ref 6–29)
AST: 26 U/L (ref 10–35)
Albumin: 3.6 g/dL (ref 3.6–5.1)
Alkaline phosphatase (APISO): 77 U/L (ref 37–153)
BUN: 8 mg/dL (ref 7–25)
CO2: 31 mmol/L (ref 20–32)
Calcium: 8.8 mg/dL (ref 8.6–10.4)
Chloride: 98 mmol/L (ref 98–110)
Creat: 0.74 mg/dL (ref 0.60–0.93)
Globulin: 2.9 g/dL (calc) (ref 1.9–3.7)
Glucose, Bld: 90 mg/dL (ref 65–99)
Potassium: 3.5 mmol/L (ref 3.5–5.3)
Sodium: 137 mmol/L (ref 135–146)
Total Bilirubin: 0.6 mg/dL (ref 0.2–1.2)
Total Protein: 6.5 g/dL (ref 6.1–8.1)

## 2019-11-06 MED ORDER — DICYCLOMINE HCL 10 MG PO CAPS: 10.0000 mg | ORAL_CAPSULE | Freq: Three times a day (TID) | ORAL | 1 refills | Status: DC

## 2019-11-06 NOTE — Patient Instructions (Signed)
Diarrhea, Adult °Diarrhea is frequent loose and watery bowel movements. Diarrhea can make you feel weak and cause you to become dehydrated. Dehydration can make you tired and thirsty, cause you to have a dry mouth, and decrease how often you urinate. °Diarrhea typically lasts 2-3 days. However, it can last longer if it is a sign of something more serious. It is important to treat your diarrhea as told by your health care provider. °Follow these instructions at home: °Eating and drinking ° °  ° °Follow these recommendations as told by your health care provider: °· Take an oral rehydration solution (ORS). This is an over-the-counter medicine that helps return your body to its normal balance of nutrients and water. It is found at pharmacies and retail stores. °· Drink plenty of fluids, such as water, ice chips, diluted fruit juice, and low-calorie sports drinks. You can drink milk also, if desired. °· Avoid drinking fluids that contain a lot of sugar or caffeine, such as energy drinks, sports drinks, and soda. °· Eat bland, easy-to-digest foods in small amounts as you are able. These foods include bananas, applesauce, rice, lean meats, toast, and crackers. °· Avoid alcohol. °· Avoid spicy or fatty foods. ° °Medicines °· Take over-the-counter and prescription medicines only as told by your health care provider. °· If you were prescribed an antibiotic medicine, take it as told by your health care provider. Do not stop using the antibiotic even if you start to feel better. °General instructions ° °· Wash your hands often using soap and water. If soap and water are not available, use a hand sanitizer. Others in the household should wash their hands as well. Hands should be washed: °? After using the toilet or changing a diaper. °? Before preparing, cooking, or serving food. °? While caring for a sick person or while visiting someone in a hospital. °· Drink enough fluid to keep your urine pale yellow. °· Rest at home while  you recover. °· Watch your condition for any changes. °· Take a warm bath to relieve any burning or pain from frequent diarrhea episodes. °· Keep all follow-up visits as told by your health care provider. This is important. °Contact a health care provider if: °· You have a fever. °· Your diarrhea gets worse. °· You have new symptoms. °· You cannot keep fluids down. °· You feel light-headed or dizzy. °· You have a headache. °· You have muscle cramps. °Get help right away if: °· You have chest pain. °· You feel extremely weak or you faint. °· You have bloody or black stools or stools that look like tar. °· You have severe pain, cramping, or bloating in your abdomen. °· You have trouble breathing or you are breathing very quickly. °· Your heart is beating very quickly. °· Your skin feels cold and clammy. °· You feel confused. °· You have signs of dehydration, such as: °? Dark urine, very little urine, or no urine. °? Cracked lips. °? Dry mouth. °? Sunken eyes. °? Sleepiness. °? Weakness. °Summary °· Diarrhea is frequent loose and watery bowel movements. Diarrhea can make you feel weak and cause you to become dehydrated. °· Drink enough fluids to keep your urine pale yellow. °· Make sure that you wash your hands after using the toilet. If soap and water are not available, use hand sanitizer. °· Contact a health care provider if your diarrhea gets worse or you have new symptoms. °· Get help right away if you have signs of dehydration. °This   information is not intended to replace advice given to you by your health care provider. Make sure you discuss any questions you have with your health care provider. °Document Revised: 08/09/2018 Document Reviewed: 08/27/2017 °Elsevier Patient Education © 2020 [employer] ° °

## 2019-11-06 NOTE — Assessment & Plan Note (Signed)
With abd pain Recheck labs ua normal  Check CT abd / pelvis

## 2019-11-06 NOTE — Progress Notes (Signed)
Patient ID: Alexandria [name], female    DOB: 06/10/1945  Age: 74 y.o. MRN: [id number]    Subjective:  Subjective  HPI Alexandria [name] presents for f/u er and vv for diarrhea.   covid neg Review of Systems  Constitutional: Negative for appetite change, diaphoresis, fatigue and unexpected weight change.  Eyes: Negative for pain, redness and visual disturbance.  Respiratory: Negative for cough, chest tightness, shortness of breath and wheezing.   Cardiovascular: Negative for chest pain, palpitations and leg swelling.  Gastrointestinal: Positive for abdominal pain and diarrhea.  Endocrine: Negative for cold intolerance, heat intolerance, polydipsia, polyphagia and polyuria.  Genitourinary: Negative for difficulty urinating, dysuria and frequency.  Neurological: Negative for dizziness, light-headedness, numbness and headaches.    History Past Medical History:  Diagnosis Date  . Arthritis   . Asthmatic bronchitis with acute exacerbation 02/06/2015  . Bronchial pneumonia   . Colon polyp   . Diverticulitis   . DVT (deep venous thrombosis) ([place])    lower extremity  . H/O cardiac catheterization 2004   Normal coronary arteries  . Heart murmur   . History of stress test 05/21/2011  . Hx of echocardiogram 01/27/2010   Normal Ef 55% the transmitral spectral doppler flow pattern is normal for age. the left ventricular wall motion is normal  . IPF (idiopathic pulmonary fibrosis) ([place]) 01/2018  . Migraines   . Pancreatitis   . Thyroid disease   . TIA (transient ischemic attack)    8 years ago    She has a past surgical history that includes Vaginal hysterectomy (10/17/1998); Breast biopsy; Total knee arthroplasty; Neck surgery; Cardiac catheterization; left heart catheterization with coronary angiogram (N/A, 10/25/2013); Video bronchoscopy (Bilateral, 01/24/2018); and Cataract extraction (Right, 03/22/2018).   Her family history includes Arthritis in an other family member; Colon  cancer in her brother and father; Diabetes in her maternal grandmother; HIV in her brother; Heart disease in her maternal grandmother; Hypertension in her maternal grandmother; Kidney cancer in her brother; Lung cancer in her brother; Other in her brother; Ovarian cancer in her mother; Prostate cancer in her father; Stroke in her maternal grandmother; Uterine cancer in her mother.She reports that she quit smoking about 50 years ago. She quit after 2.00 years of use. She has never used smokeless tobacco. She reports that she does not drink alcohol and does not use drugs.  Current Outpatient Medications on File Prior to Visit  Medication Sig Dispense Refill  . albuterol (PROAIR HFA) 108 (90 Base) MCG/ACT inhaler Inhale 2 puffs into the lungs every 6 (six) hours as needed for wheezing or shortness of breath.    . benzonatate (TESSALON) 100 MG capsule Take 1 capsule (100 mg total) by mouth 3 (three) times daily as needed for cough. 90 capsule 6  . Biotin (RA BIOTIN) 1000 MCG tablet Take 1,000 mcg by mouth 2 (two) times daily.     . cephALEXin (KEFLEX) 500 MG capsule Take 1 capsule (500 mg total) by mouth 3 (three) times daily. 15 capsule 0  . clopidogrel (PLAVIX) 75 MG tablet TAKE 1 TABLET BY MOUTH EVERY DAY 90 tablet 1  . dicyclomine (BENTYL) 20 MG tablet TAKE 1 TABLET BY MOUTH FOUR TIMES A DAY AS NEEDED 360 tablet 1  . furosemide (LASIX) 40 MG tablet TAKE 1 TABLET BY MOUTH TWICE A DAY 180 tablet 0  . KLOR-CON M20 20 MEQ tablet TAKE 1 TABLET (20 MEQ TOTAL) BY MOUTH 2 (TWO) TIMES DAILY. WILL TAKE 3 A DAILY ONLY AS  NEEDED. 270 tablet 0  . metoprolol succinate (TOPROL-XL) 25 MG 24 hr tablet TAKE 1 TABLET BY MOUTH EVERY DAY 90 tablet 1  . OFEV 150 MG CAPS TAKE 1 CAPSULE BY MOUTH TWICE DAILY 12 HOURS APART WITH FOOD. SWALLOW WHOLE. CALL [phone] FOR REFILLS 60 capsule 4  . ondansetron (ZOFRAN ODT) 4 MG disintegrating tablet Take 1 tablet (4 mg total) by mouth every 8 (eight) hours as needed for nausea or  vomiting. 60 tablet 2  . SYNTHROID 112 MCG tablet TAKE 1 TABLET BY MOUTH EVERY DAY BEFORE BREAKFAST 90 tablet 1  . vitamin C (ASCORBIC ACID) 500 MG tablet Take 1,000 mg by mouth 2 (two) times daily.     . vitamin E 1000 UNIT capsule Take 1,000 Units by mouth daily.    . Multiple Vitamin (MULTIVITAMIN) tablet Take 1 tablet by mouth every other day. Gluten free/Vegetarian Multivitamin (Patient not taking: Reported on 11/06/2019)    . Vitamin D, Ergocalciferol, (DRISDOL) 1.25 MG (50000 UNIT) CAPS capsule Take 1 capsule (50,000 Units total) by mouth every 7 (seven) days. Take for 8 total doses(weeks) (Patient not taking: Reported on 11/06/2019) 8 capsule 0   No current facility-administered medications on file prior to visit.     Objective:  Objective  Physical Exam Vitals and nursing note reviewed.  Constitutional:      Appearance: She is well-developed.  HENT:     Head: Normocephalic and atraumatic.  Eyes:     Conjunctiva/sclera: Conjunctivae normal.  Neck:     Thyroid: No thyromegaly.     Vascular: No carotid bruit or JVD.  Cardiovascular:     Rate and Rhythm: Normal rate and regular rhythm.     Heart sounds: Normal heart sounds. No murmur heard.   Pulmonary:     Effort: Pulmonary effort is normal. No respiratory distress.     Breath sounds: Normal breath sounds. No wheezing or rales.  Chest:     Chest wall: No tenderness.  Abdominal:     Tenderness: There is generalized abdominal tenderness. There is no right CVA tenderness, left CVA tenderness, guarding or rebound.     Comments: Generalized tenderness No rebound or guarding   Musculoskeletal:     Cervical back: Normal range of motion and neck supple.  Neurological:     Mental Status: She is alert and oriented to person, place, and time.    BP 128/78 (BP Location: Right Arm, Patient Position: Sitting, Cuff Size: Normal)   Pulse 83   Temp 97.8 F (36.6 C) (Oral)   Resp 18   Ht 5\' 4"  (1.626 m)   Wt 128 lb 12.8 oz (58.4 kg)    SpO2 98%   BMI 22.11 kg/m  Wt Readings from Last 3 Encounters:  11/06/19 128 lb 12.8 oz (58.4 kg)  11/02/19 127 lb (57.6 kg)  09/07/19 135 lb 9.6 oz (61.5 kg)     Lab Results  Component Value Date   WBC 15.1 (H) 11/02/2019   HGB 13.8 11/02/2019   HCT 40.2 11/02/2019   PLT 244 11/02/2019   GLUCOSE 131 (H) 11/02/2019   CHOL 193 09/06/2019   TRIG 104.0 09/06/2019   HDL 52.60 09/06/2019   LDLCALC 120 (H) 09/06/2019   ALT 18 09/06/2019   AST 18 09/06/2019   NA 136 11/02/2019   K 3.6 11/02/2019   CL 94 (L) 11/02/2019   CREATININE 0.99 11/02/2019   BUN 15 11/02/2019   CO2 27 11/02/2019   TSH 2.16 09/06/2019   INR  1.1 (H) 05/07/2015    No results found.   Assessment & Plan:  Plan  I have discontinued Arihanna Estabrook. Sivils's amoxicillin-clavulanate. I am also having her start on dicyclomine. Additionally, I am having her maintain her multivitamin, Biotin, vitamin C, vitamin E, dicyclomine, Vitamin D (Ergocalciferol), clopidogrel, Ofev, ondansetron, Klor-Con M20, furosemide, metoprolol succinate, Synthroid, albuterol, benzonatate, and cephALEXin.  Meds ordered this encounter  Medications  . dicyclomine (BENTYL) 10 MG capsule    Sig: Take 1 capsule (10 mg total) by mouth 4 (four) times daily -  before meals and at bedtime.    Dispense:  90 capsule    Refill:  1    Problem List Items Addressed This Visit      Unprioritized   Diarrhea    With abd pain Recheck labs ua normal  Check CT abd / pelvis      Relevant Medications   dicyclomine (BENTYL) 10 MG capsule   Generalized abdominal pain    ? Diverticulitis Check  CT Recheck lab s      Relevant Orders   CT Abdomen Pelvis W Contrast   Comprehensive metabolic panel   Leukocytosis - Primary    Recheck labs today      Relevant Orders   CBC with Differential/Platelet   POCT Urinalysis Dipstick (Automated) (Completed)      Follow-up: Return if symptoms worsen or fail to improve.  [name],  DO

## 2019-11-06 NOTE — Assessment & Plan Note (Signed)
Recheck labs today. 

## 2019-11-06 NOTE — Assessment & Plan Note (Signed)
?   Diverticulitis Check  CT Recheck lab s

## 2019-11-07 LAB — CBC WITH DIFFERENTIAL/PLATELET
Basophils Absolute: 0 10*3/uL (ref 0.0–0.1)
Basophils Relative: 0.4 % (ref 0.0–3.0)
Eosinophils Absolute: 0.5 10*3/uL (ref 0.0–0.7)
Eosinophils Relative: 7.6 % — ABNORMAL HIGH (ref 0.0–5.0)
HCT: 38.2 % (ref 36.0–46.0)
Hemoglobin: 13.1 g/dL (ref 12.0–15.0)
Lymphocytes Relative: 20.8 % (ref 12.0–46.0)
Lymphs Abs: 1.4 10*3/uL (ref 0.7–4.0)
MCHC: 34.3 g/dL (ref 30.0–36.0)
MCV: 96.9 fl (ref 78.0–100.0)
Monocytes Absolute: 0.9 10*3/uL (ref 0.1–1.0)
Monocytes Relative: 14 % — ABNORMAL HIGH (ref 3.0–12.0)
Neutro Abs: 3.9 10*3/uL (ref 1.4–7.7)
Neutrophils Relative %: 57.2 % (ref 43.0–77.0)
Platelets: 273 10*3/uL (ref 150.0–400.0)
RBC: 3.94 Mil/uL (ref 3.87–5.11)
RDW: 13.6 % (ref 11.5–15.5)
WBC: 6.7 10*3/uL (ref 4.0–10.5)

## 2019-11-08 ENCOUNTER — Other Ambulatory Visit: Payer: Self-pay

## 2019-11-08 ENCOUNTER — Ambulatory Visit (HOSPITAL_BASED_OUTPATIENT_CLINIC_OR_DEPARTMENT_OTHER)
Admission: RE | Admit: 2019-11-08 | Discharge: 2019-11-08 | Disposition: A | Discharge status: Home / Self Care | Payer: Medicare Other | Source: Ambulatory Visit | Attending: Family Medicine | Admitting: Family Medicine

## 2019-11-08 DIAGNOSIS — R1084 Generalized abdominal pain: Secondary | ICD-10-CM | POA: Diagnosis not present

## 2019-11-08 DIAGNOSIS — K6389 Other specified diseases of intestine: Secondary | ICD-10-CM | POA: Diagnosis not present

## 2019-11-08 DIAGNOSIS — K5730 Diverticulosis of large intestine without perforation or abscess without bleeding: Secondary | ICD-10-CM | POA: Diagnosis not present

## 2019-11-08 MED ORDER — IOHEXOL 300 MG/ML  SOLN
100.0000 mL | Freq: Once | INTRAMUSCULAR | Status: AC | PRN
  Administered 2019-11-08: 100 mL via INTRAVENOUS

## 2019-11-09 ENCOUNTER — Telehealth: Payer: Self-pay | Admitting: Family Medicine

## 2019-11-09 NOTE — Telephone Encounter (Signed)
Caller: Jameya Call back # [phone]  Wants CT results

## 2019-11-10 ENCOUNTER — Telehealth: Payer: Self-pay | Admitting: Family Medicine

## 2019-11-10 NOTE — Telephone Encounter (Signed)
Spoke with pt. Pt wants to know what is the next step. States still have pain and diarrhea. Pt states finishing the antibiotic and her sxs came back. Pt states having a GI doctor but he away on vacation. Please advise

## 2019-11-10 NOTE — Telephone Encounter (Signed)
I can increase bentyl but she really needs to see her GI--- doesn't he have someone covering for him ?  She can take 20 mg bentyl --- ok to send to pharmacy if she does not have enough

## 2019-11-10 NOTE — Telephone Encounter (Signed)
Patient states that she is having diarrhea and vomiting.Patient would like to what to do. Please advise

## 2019-11-10 NOTE — Telephone Encounter (Signed)
See other telephone note

## 2019-11-13 ENCOUNTER — Emergency Department (HOSPITAL_BASED_OUTPATIENT_CLINIC_OR_DEPARTMENT_OTHER)
Admission: EM | Admit: 2019-11-13 | Discharge: 2019-11-13 | Disposition: A | Discharge status: Home / Self Care | Payer: Medicare Other | Attending: Emergency Medicine | Admitting: Emergency Medicine

## 2019-11-13 ENCOUNTER — Encounter (HOSPITAL_BASED_OUTPATIENT_CLINIC_OR_DEPARTMENT_OTHER): Payer: Self-pay | Admitting: *Deleted

## 2019-11-13 ENCOUNTER — Other Ambulatory Visit: Payer: Self-pay

## 2019-11-13 DIAGNOSIS — Z79899 Other long term (current) drug therapy: Secondary | ICD-10-CM | POA: Insufficient documentation

## 2019-11-13 DIAGNOSIS — R197 Diarrhea, unspecified: Secondary | ICD-10-CM | POA: Insufficient documentation

## 2019-11-13 DIAGNOSIS — R42 Dizziness and giddiness: Secondary | ICD-10-CM | POA: Insufficient documentation

## 2019-11-13 DIAGNOSIS — E039 Hypothyroidism, unspecified: Secondary | ICD-10-CM | POA: Diagnosis not present

## 2019-11-13 DIAGNOSIS — Z87891 Personal history of nicotine dependence: Secondary | ICD-10-CM | POA: Insufficient documentation

## 2019-11-13 DIAGNOSIS — Z96651 Presence of right artificial knee joint: Secondary | ICD-10-CM | POA: Diagnosis not present

## 2019-11-13 DIAGNOSIS — Z96652 Presence of left artificial knee joint: Secondary | ICD-10-CM | POA: Diagnosis not present

## 2019-11-13 DIAGNOSIS — K529 Noninfective gastroenteritis and colitis, unspecified: Secondary | ICD-10-CM | POA: Diagnosis not present

## 2019-11-13 DIAGNOSIS — J45909 Unspecified asthma, uncomplicated: Secondary | ICD-10-CM | POA: Diagnosis not present

## 2019-11-13 DIAGNOSIS — I10 Essential (primary) hypertension: Secondary | ICD-10-CM | POA: Insufficient documentation

## 2019-11-13 LAB — CBC
HCT: 44.4 % (ref 36.0–46.0)
Hemoglobin: 14.7 g/dL (ref 12.0–15.0)
MCH: 32.7 pg (ref 26.0–34.0)
MCHC: 33.1 g/dL (ref 30.0–36.0)
MCV: 98.9 fL (ref 80.0–100.0)
Platelets: 337 10*3/uL (ref 150–400)
RBC: 4.49 MIL/uL (ref 3.87–5.11)
RDW: 13.2 % (ref 11.5–15.5)
WBC: 6.6 10*3/uL (ref 4.0–10.5)
nRBC: 0 % (ref 0.0–0.2)

## 2019-11-13 LAB — URINALYSIS, ROUTINE W REFLEX MICROSCOPIC
Bilirubin Urine: NEGATIVE
Glucose, UA: NEGATIVE mg/dL
Hgb urine dipstick: NEGATIVE
Ketones, ur: NEGATIVE mg/dL
Leukocytes,Ua: NEGATIVE
Nitrite: NEGATIVE
Protein, ur: NEGATIVE mg/dL
Specific Gravity, Urine: 1.015 (ref 1.005–1.030)
pH: 6 (ref 5.0–8.0)

## 2019-11-13 LAB — COMPREHENSIVE METABOLIC PANEL
ALT: 23 U/L (ref 0–44)
AST: 24 U/L (ref 15–41)
Albumin: 4 g/dL (ref 3.5–5.0)
Alkaline Phosphatase: 66 U/L (ref 38–126)
Anion gap: 12 (ref 5–15)
BUN: 11 mg/dL (ref 8–23)
CO2: 29 mmol/L (ref 22–32)
Calcium: 9.1 mg/dL (ref 8.9–10.3)
Chloride: 95 mmol/L — ABNORMAL LOW (ref 98–111)
Creatinine, Ser: 0.88 mg/dL (ref 0.44–1.00)
GFR calc Af Amer: >60 mL/min (ref >60)
GFR calc non Af Amer: >60 mL/min (ref >60)
Glucose, Bld: 108 mg/dL — ABNORMAL HIGH (ref 70–99)
Potassium: 3.8 mmol/L (ref 3.5–5.1)
Sodium: 136 mmol/L (ref 135–145)
Total Bilirubin: 0.8 mg/dL (ref 0.3–1.2)
Total Protein: 8.1 g/dL (ref 6.5–8.1)

## 2019-11-13 LAB — LIPASE, BLOOD: Lipase: 26 U/L (ref 11–51)

## 2019-11-13 NOTE — Discharge Instructions (Addendum)
Please return to ER for reassessment if you develop vomiting, abdominal pain, worsening lightheadedness, any episodes of passing out or other new concerning symptom.  Keep follow-up appointment with GI, additionally recommend follow-up with primary doctor sometime this week.

## 2019-11-13 NOTE — ED Provider Notes (Signed)
[facility] EMERGENCY DEPARTMENT Provider Note   CSN: [id number] Arrival date & time: 11/13/19  1307     History Chief Complaint  Patient presents with  . Diarrhea    Alexandria [name] is a 74 y.o. female.  Presented to ER with concern for possible dehydration.  She reports chronic diarrhea, daily has multiple loose stools.  She has undergone extensive evaluation in ER and in the outpatient setting including labs, CT imaging, GI referral.  She reports she has a colonoscopy and endoscopy scheduled on August 18.  She has not had any recent changes in her diarrhea, today has had 2 loose bowel movements.  Yesterday had 4 5.  No vomiting, no abdominal pain.  No fevers.  She tries to stay hydrated plenty of fluids, however today she felt somewhat lightheaded when going from sitting to standing position. No dizziness, no room spinning, no numbness, weakness, or vision change. Currently does not have any ongoing symptoms.  Reports that her primary doctor recommended going to ER to have her blood work checked to rule out dehydration.  HPI     Past Medical History:  Diagnosis Date  . Arthritis   . Asthmatic bronchitis with acute exacerbation 02/06/2015  . Bronchial pneumonia   . Colon polyp   . Diverticulitis   . DVT (deep venous thrombosis) ([place])    lower extremity  . H/O cardiac catheterization 2004   Normal coronary arteries  . Heart murmur   . History of stress test 05/21/2011  . Hx of echocardiogram 01/27/2010   Normal Ef 55% the transmitral spectral doppler flow pattern is normal for age. the left ventricular wall motion is normal  . IPF (idiopathic pulmonary fibrosis) ([place]) 01/2018  . Migraines   . Pancreatitis   . Thyroid disease   . TIA (transient ischemic attack)    8 years ago    Patient Active Problem List   Diagnosis Date Noted  . Leukocytosis 11/06/2019  . Diarrhea 11/06/2019  . Generalized abdominal pain 11/06/2019  . Pre-ulcerative corn or callous  09/07/2019  . Nausea 09/07/2019  . Abnormal CT of the chest 08/30/2019  . Pain of left calf 05/09/2019  . Tibial pain 05/09/2019  . Essential hypertension 02/08/2019  . Healthcare maintenance 11/10/2018  . Bradycardia 07/22/2018  . Pansinusitis 06/23/2018  . History of transient ischemic attack (TIA) 03/24/2018  . IPF (idiopathic pulmonary fibrosis) ([place]) 03/24/2018  . Allergic rhinitis 02/16/2018  . Eustachian tube dysfunction, bilateral 02/16/2018  . URI (upper respiratory infection) 02/16/2018  . Seasonal allergic rhinitis due to pollen 02/16/2018  . ILD (interstitial lung disease) ([place]) 01/07/2018  . Dyspnea 01/07/2018  . Acute respiratory failure ([place]) 01/06/2018  . TIA (transient ischemic attack) 01/06/2018  . Hypokalemia 12/15/2017  . Chronic cough 11/26/2017  . Basal cell carcinoma (BCC) of neck 06/07/2017  . Anisometropia 06/02/2017  . Drusen of macula of both eyes 06/02/2017  . Photopsia of left eye 06/02/2017  . Cardiac murmur 05/07/2017  . Diastolic dysfunction 10/93/2355  . Situational anxiety 05/07/2017  . Headache 05/07/2017  . Mitral and aortic insufficiency 05/07/2017  . Cellulitis of left lower extremity 04/30/2017  . Migraine without status migrainosus, not intractable 06/09/2016  . Insomnia 11/18/2015  . Chest pain at rest 05/20/2015  . RBBB 05/20/2015  . Abnormal CXR 05/20/2015  . Hx of myocardial infarction 05/08/2015  . Bruising 05/08/2015  . Upper airway cough syndrome 02/14/2015  . Asthmatic bronchitis with acute exacerbation 02/06/2015  . Oliguria 12/28/2014  .  Hypothyroidism 10/25/2014  . Fatigue 10/25/2014  . 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 Chronic venous insufficiency 10/24/2014  . Varicose veins of leg with complications 41/74/0814  . Degeneration of intervertebral disc of cervical region 06/29/2014  . 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 Generalized osteoarthritis of multiple sites 06/29/2014  . History of  stroke without residual deficits 06/29/2014  . Palpitations 11/08/2013  . SOB (shortness of breath) 10/03/2013  . DOE (dyspnea on exertion) 10/03/2013  . Edema of both legs 10/03/2013  . Edema of lower extremity 10/03/2013  . Hair loss 11/25/2012  . Alopecia 11/25/2012  . Diverticulitis 07/14/2012  . Diverticulitis of intestine 07/14/2012  . Hammer toe 06/13/2012  . Recurrent abdominal pain 04/17/2012  . Hypoglycemia 01/06/2012    Past Surgical History:  Procedure Laterality Date  . BREAST BIOPSY     Isaiah Blakes  . CARDIAC CATHETERIZATION     10/2013  . CATARACT EXTRACTION Right 03/22/2018  . LEFT HEART CATHETERIZATION WITH CORONARY ANGIOGRAM N/A 10/25/2013   Procedure: LEFT HEART CATHETERIZATION WITH CORONARY ANGIOGRAM;  Surgeon: Troy Sine, MD;  Location: [facility] CATH LAB;  Service: Cardiovascular;  Laterality: N/A;  . NECK SURGERY     x's 2  . TOTAL KNEE ARTHROPLASTY     Bilateral x's 2  . VAGINAL HYSTERECTOMY  10/17/1998   Virgie BRONCHOSCOPY Bilateral 01/24/2018   Procedure: VIDEO BRONCHOSCOPY WITH FLUORO;  Surgeon: [name], MD;  Location: [facility];  Service: Cardiopulmonary;  Laterality: Bilateral;     OB History   No obstetric history on file.     Family History  Problem Relation Age of Onset  . Arthritis Other   . Colon cancer Father   . Prostate cancer Father   . HIV Brother        73  . Kidney cancer Brother   . Colon cancer Brother   . Lung cancer Brother   . Other Brother        Mouth Cancer  . Ovarian cancer Mother   . Uterine cancer Mother   . Heart disease Maternal Grandmother   . Stroke Maternal Grandmother   . Hypertension Maternal Grandmother   . Diabetes Maternal Grandmother     Social History   Tobacco Use  . Smoking status: Former Smoker    Years: 2.00    Quit date: 04/06/1969    Years since quitting: 50.6  . Smokeless tobacco: Never Used  . Tobacco comment: socially smoked x 2 years  Vaping Use  . Vaping  Use: Never used  Substance Use Topics  . Alcohol use: No    Alcohol/week: 0.0 standard drinks  . Drug use: No    Home Medications Prior to Admission medications   Medication Sig Start Date End Date Taking? Authorizing Provider  albuterol (PROAIR HFA) 108 (90 Base) MCG/ACT inhaler Inhale 2 puffs into the lungs every 6 (six) hours as needed for wheezing or shortness of breath. 09/28/19   Carollee Herter, Alferd Apa, DO  benzonatate (TESSALON) 100 MG capsule Take 1 capsule (100 mg total) by mouth 3 (three) times daily as needed for cough. 10/06/19   Brand Males, MD  Biotin (RA BIOTIN) 1000 MCG tablet Take 1,000 mcg by mouth 2 (two) times daily.     [provider]  cephALEXin (KEFLEX) 500 MG capsule Take 1 capsule (500 mg total) by mouth 3 (three) times daily. 11/02/19   Debbrah Alar, NP  clopidogrel (PLAVIX) 75 MG tablet TAKE 1 TABLET BY MOUTH  EVERY DAY 06/05/19   [name], DO  dicyclomine (BENTYL) 10 MG capsule Take 1 capsule (10 mg total) by mouth 4 (four) times daily -  before meals and at bedtime. 11/06/19   Roma Schanz R, DO  dicyclomine (BENTYL) 20 MG tablet TAKE 1 TABLET BY MOUTH FOUR TIMES A DAY AS NEEDED 04/11/19   Carollee Herter, Alferd Apa, DO  furosemide (LASIX) 40 MG tablet TAKE 1 TABLET BY MOUTH TWICE A DAY 09/13/19   Lowne Chase, Yvonne R, DO  KLOR-CON M20 20 MEQ tablet TAKE 1 TABLET (20 MEQ TOTAL) BY MOUTH 2 (TWO) TIMES DAILY. WILL TAKE 3 A DAILY ONLY AS NEEDED. 09/11/19   Carollee Herter, Alferd Apa, DO  metoprolol succinate (TOPROL-XL) 25 MG 24 hr tablet TAKE 1 TABLET BY MOUTH EVERY DAY 09/14/19   Tobb, Kardie, DO  Multiple Vitamin (MULTIVITAMIN) tablet Take 1 tablet by mouth every other day. Gluten free/Vegetarian Multivitamin Patient not taking: Reported on 11/06/2019    [provider]  OFEV 150 MG CAPS TAKE 1 CAPSULE BY MOUTH TWICE DAILY 12 HOURS APART WITH FOOD. SWALLOW WHOLE. CALL [phone] FOR REFILLS 07/12/19   Brand Males, MD  ondansetron  (ZOFRAN ODT) 4 MG disintegrating tablet Take 1 tablet (4 mg total) by mouth every 8 (eight) hours as needed for nausea or vomiting. 09/07/19   [name], DO  SYNTHROID 112 MCG tablet TAKE 1 TABLET BY MOUTH EVERY DAY BEFORE BREAKFAST 09/26/19   Carollee Herter, Alferd Apa, DO  vitamin C (ASCORBIC ACID) 500 MG tablet Take 1,000 mg by mouth 2 (two) times daily.     [provider]  Vitamin D, Ergocalciferol, (DRISDOL) 1.25 MG (50000 UNIT) CAPS capsule Take 1 capsule (50,000 Units total) by mouth every 7 (seven) days. Take for 8 total doses(weeks) Patient not taking: Reported on 11/06/2019 05/10/19   Rosemarie Ax, MD  vitamin E 1000 UNIT capsule Take 1,000 Units by mouth daily.    [provider]    Allergies    Prednisone, Atrovent hfa [ipratropium bromide hfa], Cheratussin ac [guaifenesin-codeine], Codeine, Hydrocodone, Influenza vaccines, Motrin [ibuprofen], and Oxycodone-acetaminophen  Review of Systems   Review of Systems  Constitutional: Negative for chills and fever.  HENT: Negative for ear pain and sore throat.   Eyes: Negative for pain and visual disturbance.  Respiratory: Negative for cough and shortness of breath.   Cardiovascular: Negative for chest pain and palpitations.  Gastrointestinal: Positive for diarrhea. Negative for abdominal pain and vomiting.  Genitourinary: Negative for dysuria and hematuria.  Musculoskeletal: Negative for arthralgias and back pain.  Skin: Negative for color change and rash.  Neurological: Positive for light-headedness. Negative for seizures and syncope.  All other systems reviewed and are negative.   Physical Exam Updated Vital Signs BP (!) 187/94 (BP Location: Left Arm)   Pulse 85   Temp 98.8 F (37.1 C) (Oral)   Resp 17   Ht 5\' 4"  (1.626 m)   Wt 57.2 kg   SpO2 94%   BMI 21.63 kg/m   Physical Exam Vitals and nursing note reviewed.  Constitutional:      General: She is not in acute distress.    Appearance: She is  well-developed.  HENT:     Head: Normocephalic and atraumatic.  Eyes:     Conjunctiva/sclera: Conjunctivae normal.  Cardiovascular:     Rate and Rhythm: Normal rate and regular rhythm.     Heart sounds: No murmur heard.   Pulmonary:  Effort: Pulmonary effort is normal. No respiratory distress.     Breath sounds: Normal breath sounds.  Abdominal:     Palpations: Abdomen is soft.     Tenderness: There is no abdominal tenderness.  Musculoskeletal:     Cervical back: Neck supple.  Skin:    General: Skin is warm and dry.     Capillary Refill: Capillary refill takes less than 2 seconds.  Neurological:     General: No focal deficit present.     Mental Status: She is alert and oriented to person, place, and time.     Comments: AAOx3 CN 2-12 intact, speech clear visual fields intact 5/5 strength in b/l UE and LE Sensation to light touch intact in b/l UE and LE Normal FNF Normal gait  Psychiatric:        Mood and Affect: Mood normal.        Behavior: Behavior normal.     ED Results / Procedures / Treatments   Labs (all labs ordered are listed, but only abnormal results are displayed) Labs Reviewed  COMPREHENSIVE METABOLIC PANEL - Abnormal; Notable for the following components:      Result Value   Chloride 95 (*)    Glucose, Bld 108 (*)    All other components within normal limits  LIPASE, BLOOD  CBC  URINALYSIS, ROUTINE W REFLEX MICROSCOPIC    EKG None  Radiology No results found.  Procedures Procedures (including critical care time)  Medications Ordered in ED Medications - No data to display  ED Course  I have reviewed the triage vital signs and the nursing notes.  Pertinent labs & imaging results that were available during my care of the patient were reviewed by me and considered in my medical decision making (see chart for details).    MDM Rules/Calculators/A&P                         74 year old lady with chronic diarrhea presents to ER with concern  for possible dehydration as well as sensation of lightheadedness when standing.  On exam, she is remarkably well-appearing with stable vital signs.  Her abdomen is soft.  Labs grossly stable, EKG without acute changes.  Suspect patient may have degree of mild dehydration.  Offered IV placement and IV fluids however patient declined and reports that she will pursue oral hydration at home.  Recommended follow-up with her primary doctor and GI.    After the discussed management above, the patient was determined to be safe for discharge.  The patient was in agreement with this plan and all questions regarding their care were answered.  ED return precautions were discussed and the patient will return to the ED with any significant worsening of condition.    Final Clinical Impression(s) / ED Diagnoses Final diagnoses:  Chronic diarrhea    Rx / DC Orders ED Discharge Orders    None       Lucrezia Starch, MD 11/13/19 1742

## 2019-11-13 NOTE — ED Triage Notes (Signed)
Diarrhea. Her MD told her to come get blood to see if she is dehydrated.

## 2019-11-13 NOTE — ED Notes (Signed)
ED Provider at bedside. 

## 2019-11-16 ENCOUNTER — Other Ambulatory Visit: Payer: Self-pay

## 2019-11-16 ENCOUNTER — Encounter: Payer: Self-pay | Admitting: Family Medicine

## 2019-11-16 ENCOUNTER — Telehealth (INDEPENDENT_AMBULATORY_CARE_PROVIDER_SITE_OTHER): Payer: Medicare Other | Admitting: Family Medicine

## 2019-11-16 DIAGNOSIS — E785 Hyperlipidemia, unspecified: Secondary | ICD-10-CM

## 2019-11-16 DIAGNOSIS — R197 Diarrhea, unspecified: Secondary | ICD-10-CM

## 2019-11-16 DIAGNOSIS — R739 Hyperglycemia, unspecified: Secondary | ICD-10-CM | POA: Diagnosis not present

## 2019-11-16 DIAGNOSIS — I200 Unstable angina: Secondary | ICD-10-CM | POA: Diagnosis not present

## 2019-11-16 MED ORDER — DICYCLOMINE HCL 20 MG PO TABS: 20.0000 mg | ORAL_TABLET | Freq: Three times a day (TID) | ORAL | 1 refills | Status: DC

## 2019-11-16 NOTE — Progress Notes (Signed)
Virtual Visit via Video Note  I connected with Alexandria [name] on 11/16/19 at  8:20 AM EDT by a video enabled telemedicine application and verified that I am speaking with the correct person using two identifiers.  Location: Patient: home alone  Provider: office    I discussed the limitations of evaluation and management by telemedicine and the availability of in person appointments. The patient expressed understanding and agreed to proceed.  History of Present Illness: Pt is home c/o diarrhea that is constant-- the bentyl helps with the pain but she can not go anywhere due to the diarrhea.  She has an appointment next week with her GI --- they were unable to get her in sooner due to dr being out of town but they have called her daily.  She is concerned it is the med for pulm fibrosis.  She has been to er and labs were ok and she was not dehydrated.     Observations/Objective: There were no vitals filed for this visit. No fever Labs and er visit reviewed   Assessment and Plan: 1. Hyperglycemia Recheck fasting  - Hemoglobin A1c; Future - Comprehensive metabolic panel; Future  2. Diarrhea, unspecified type Can inc bentyl to 40 mg prn  f/u GI - dicyclomine (BENTYL) 20 MG tablet; Take 1 tablet (20 mg total) by mouth 4 (four) times daily -  before meals and at bedtime.  Dispense: 360 tablet; Refill: 1 - TSH; Future   3 Dyslipidemia Check labs  - Lipid panel; Future   Follow Up Instructions:    I discussed the assessment and treatment plan with the patient. The patient was provided an opportunity to ask questions and all were answered. The patient agreed with the plan and demonstrated an understanding of the instructions.   The patient was advised to call back or seek an in-person evaluation if the symptoms worsen or if the condition fails to improve as anticipated.  I provided 25 minutes of non-face-to-face time during this encounter.   [name], DO

## 2019-11-16 NOTE — Telephone Encounter (Signed)
Pt had virtual visit today. 

## 2019-11-17 DIAGNOSIS — Z01812 Encounter for preprocedural laboratory examination: Secondary | ICD-10-CM | POA: Diagnosis not present

## 2019-11-17 DIAGNOSIS — Z20822 Contact with and (suspected) exposure to covid-19: Secondary | ICD-10-CM | POA: Diagnosis not present

## 2019-11-17 DIAGNOSIS — R197 Diarrhea, unspecified: Secondary | ICD-10-CM | POA: Diagnosis not present

## 2019-11-22 DIAGNOSIS — R197 Diarrhea, unspecified: Secondary | ICD-10-CM | POA: Diagnosis not present

## 2019-11-22 DIAGNOSIS — R112 Nausea with vomiting, unspecified: Secondary | ICD-10-CM | POA: Diagnosis not present

## 2019-11-22 DIAGNOSIS — R634 Abnormal weight loss: Secondary | ICD-10-CM | POA: Diagnosis not present

## 2019-11-22 DIAGNOSIS — R1110 Vomiting, unspecified: Secondary | ICD-10-CM | POA: Diagnosis not present

## 2019-11-22 DIAGNOSIS — K648 Other hemorrhoids: Secondary | ICD-10-CM | POA: Diagnosis not present

## 2019-11-22 DIAGNOSIS — K5730 Diverticulosis of large intestine without perforation or abscess without bleeding: Secondary | ICD-10-CM | POA: Diagnosis not present

## 2019-11-22 DIAGNOSIS — K6389 Other specified diseases of intestine: Secondary | ICD-10-CM | POA: Diagnosis not present

## 2019-11-24 ENCOUNTER — Other Ambulatory Visit: Payer: Self-pay

## 2019-11-24 ENCOUNTER — Ambulatory Visit (INDEPENDENT_AMBULATORY_CARE_PROVIDER_SITE_OTHER): Payer: Medicare Other | Admitting: Cardiology

## 2019-11-24 ENCOUNTER — Encounter: Payer: Self-pay | Admitting: Cardiology

## 2019-11-24 VITALS — BP 116/80 | HR 88 | Ht 64.0 in | Wt 128.0 lb

## 2019-11-24 DIAGNOSIS — R197 Diarrhea, unspecified: Secondary | ICD-10-CM | POA: Diagnosis not present

## 2019-11-24 DIAGNOSIS — R634 Abnormal weight loss: Secondary | ICD-10-CM | POA: Diagnosis not present

## 2019-11-24 DIAGNOSIS — I10 Essential (primary) hypertension: Secondary | ICD-10-CM

## 2019-11-24 DIAGNOSIS — I77810 Thoracic aortic ectasia: Secondary | ICD-10-CM | POA: Diagnosis not present

## 2019-11-24 DIAGNOSIS — K5900 Constipation, unspecified: Secondary | ICD-10-CM | POA: Diagnosis not present

## 2019-11-24 DIAGNOSIS — Z86010 Personal history of colonic polyps: Secondary | ICD-10-CM | POA: Diagnosis not present

## 2019-11-24 DIAGNOSIS — I080 Rheumatic disorders of both mitral and aortic valves: Secondary | ICD-10-CM

## 2019-11-24 DIAGNOSIS — K5730 Diverticulosis of large intestine without perforation or abscess without bleeding: Secondary | ICD-10-CM | POA: Diagnosis not present

## 2019-11-24 NOTE — Progress Notes (Signed)
Cardiology Office Note:    Date:  11/24/2019   ID:  Burnetta, Kohls 12-29-1945, MRN [id number]  PCP:  Carollee Herter, Alferd Apa, DO  Cardiologist:  Berniece Salines, DO  Electrophysiologist:  None   Referring MD: Carollee Herter, Alferd Apa, *   Chief Complaint  Patient presents with   Follow-up    History of Present Illness:    Alexandria [name] a 74 y.o.femalewith a hx of vasospasm, history of 2 separate left heart catheterization in 2004 and in 2015 which were all reported to be normal,diastolic dysfunction, history of TIAs, history of DVTs, paroxysmal supraventricular tachycardia, idiopathic pulmonary fibrosis and chronic venous insufficiency.  Patient is here today for follow-up visit.  She, she did recently have a colonoscopy.  This study was not complete as they could not visualize the midportion of her small intestine therefore she is getting further studies done.    She still tell me she experiencing significant projectile vomiting as well as some diarrhea.  Past Medical History:  Diagnosis Date   Arthritis    Asthmatic bronchitis with acute exacerbation 02/06/2015   Bronchial pneumonia    Colon polyp    Diverticulitis    DVT (deep venous thrombosis) (HCC)    lower extremity   H/O cardiac catheterization 2004   Normal coronary arteries   Heart murmur    History of stress test 05/21/2011   Hx of echocardiogram 01/27/2010   Normal Ef 55% the transmitral spectral doppler flow pattern is normal for age. the left ventricular wall motion is normal   IPF (idiopathic pulmonary fibrosis) ([place]) 01/2018   Migraines    Pancreatitis    Thyroid disease    TIA (transient ischemic attack)    8 years ago    Past Surgical History:  Procedure Laterality Date   BREAST BIOPSY     [place]   CARDIAC CATHETERIZATION     10/2013   CATARACT EXTRACTION Right 03/22/2018   LEFT HEART CATHETERIZATION WITH CORONARY ANGIOGRAM N/A 10/25/2013   Procedure: LEFT  HEART CATHETERIZATION WITH CORONARY ANGIOGRAM;  Surgeon: Troy Sine, MD;  Location: [facility] C[facility] LAB;  Service: Cardiovascular;  Laterality: N/A;   NECK SURGERY     x's 2   TOTAL KNEE ARTHROPLASTY     Bilateral x's 2   VAGINAL HYSTERECTOMY  10/17/1998   [name]   VIDEO BRONCHOSCOPY Bilateral 01/24/2018   Procedure: VIDEO BRONCHOSCOPY WITH FLUORO;  Surgeon: [name], MD;  Location: [place];  Service: Cardiopulmonary;  Laterality: Bilateral;    Current Medications: Current Meds  Medication Sig   albuterol (PROAIR HFA) 108 (90 Base) MCG/ACT inhaler Inhale 2 puffs into the lungs every 6 (six) hours as needed for wheezing or shortness of breath.   benzonatate (TESSALON) 100 MG capsule Take 1 capsule (100 mg total) by mouth 3 (three) times daily as needed for cough.   Biotin (RA BIOTIN) 1000 MCG tablet Take 1,000 mcg by mouth 2 (two) times daily.    cephALEXin (KEFLEX) 500 MG capsule Take 1 capsule (500 mg total) by mouth 3 (three) times daily.   clopidogrel (PLAVIX) 75 MG tablet TAKE 1 TABLET BY MOUTH EVERY DAY   dicyclomine (BENTYL) 10 MG capsule Take 1 capsule (10 mg total) by mouth 4 (four) times daily -  before meals and at bedtime.   furosemide (LASIX) 40 MG tablet TAKE 1 TABLET BY MOUTH TWICE A DAY   KLOR-CON M20 20 MEQ tablet TAKE 1 TABLET (20 MEQ TOTAL) BY MOUTH  2 (TWO) TIMES DAILY. WILL TAKE 3 A DAILY ONLY AS NEEDED.   metoprolol succinate (TOPROL-XL) 25 MG 24 hr tablet TAKE 1 TABLET BY MOUTH EVERY DAY   Multiple Vitamin (MULTIVITAMIN) tablet Take 1 tablet by mouth every other day. Gluten free/Vegetarian Multivitamin   OFEV 150 MG CAPS TAKE 1 CAPSULE BY MOUTH TWICE DAILY 12 HOURS APART WITH FOOD. SWALLOW WHOLE. CALL [phone] FOR REFILLS   ondansetron (ZOFRAN ODT) 4 MG disintegrating tablet Take 1 tablet (4 mg total) by mouth every 8 (eight) hours as needed for nausea or vomiting.   promethazine (PHENERGAN) 25 MG tablet as needed.   SYNTHROID 112 MCG  tablet TAKE 1 TABLET BY MOUTH EVERY DAY BEFORE BREAKFAST   vitamin C (ASCORBIC ACID) 500 MG tablet Take 1,000 mg by mouth 2 (two) times daily.    Vitamin D, Ergocalciferol, (DRISDOL) 1.25 MG (50000 UNIT) CAPS capsule Take 1 capsule (50,000 Units total) by mouth every 7 (seven) days. Take for 8 total doses(weeks)   vitamin E 1000 UNIT capsule Take 1,000 Units by mouth daily.   [DISCONTINUED] dicyclomine (BENTYL) 20 MG tablet Take 1 tablet (20 mg total) by mouth 4 (four) times daily -  before meals and at bedtime.     Allergies:   Prednisone, Atrovent hfa [ipratropium bromide hfa], Cheratussin ac [guaifenesin-codeine], Codeine, Hydrocodone, Influenza vaccines, Motrin [ibuprofen], and Oxycodone-acetaminophen   Social History   Socioeconomic History   Marital status: Married    Spouse name: [name]   Number of children: 3   Years of education: Jr college   Highest education level: Not on file  Occupational History   Occupation: DISABLED    Employer: UNEMPLOYED  Tobacco Use   Smoking status: Never Smoker   Smokeless tobacco: Never Used  Scientific laboratory technician Use: Never used  Substance and Sexual Activity   Alcohol use: No    Alcohol/week: 0.0 standard drinks   Drug use: No   Sexual activity: Not on file  Other Topics Concern   Not on file  Social History Narrative   Lives with husband [name]   Caffeine use: coffee (2 cups per day)   Mostly right-handed   Social Determinants of Health   Financial Resource Strain:    Difficulty of Paying Living Expenses: Not on file  Food Insecurity:    Worried About Charity fundraiser in the Last Year: Not on file   [employer] of Food in the Last Year: Not on file  Transportation Needs:    Lack of Transportation (Medical): Not on file   Lack of Transportation (Non-Medical): Not on file  Physical Activity:    Days of Exercise per Week: Not on file   Minutes of Exercise per Session: Not on file  Stress:    Feeling of Stress  : Not on file  Social Connections:    Frequency of Communication with Friends and Family: Not on file   Frequency of Social Gatherings with Friends and Family: Not on file   Attends Religious Services: Not on file   Active Member of Clubs or Organizations: Not on file   Attends Archivist Meetings: Not on file   Marital Status: Not on file     Family History: The patient's family history includes Arthritis in an other family member; Colon cancer in her brother and father; Diabetes in her maternal grandmother; HIV in her brother; Heart disease in her maternal grandmother; Hypertension in her maternal grandmother; Kidney cancer in her brother; Lung cancer  in her brother; Other in her brother; Ovarian cancer in her mother; Prostate cancer in her father; Stroke in her maternal grandmother; Uterine cancer in her mother.  ROS:   Review of Systems  Constitution: Negative for decreased appetite, fever and weight gain.  HENT: Negative for congestion, ear discharge, hoarse voice and sore throat.   Eyes: Negative for discharge, redness, vision loss in right eye and visual halos.  Cardiovascular: Negative for chest pain, dyspnea on exertion, leg swelling, orthopnea and palpitations.  Respiratory: Negative for cough, hemoptysis, shortness of breath and snoring.   Endocrine: Negative for heat intolerance and polyphagia.  Hematologic/Lymphatic: Negative for bleeding problem. Does not bruise/bleed easily.  Skin: Negative for flushing, nail changes, rash and suspicious lesions.  Musculoskeletal: Negative for arthritis, joint pain, muscle cramps, myalgias, neck pain and stiffness.  Gastrointestinal: Negative for abdominal pain, bowel incontinence, diarrhea and excessive appetite.  Genitourinary: Negative for decreased libido, genital sores and incomplete emptying.  Neurological: Negative for brief paralysis, focal weakness, headaches and loss of balance.  Psychiatric/Behavioral: Negative for  altered mental status, depression and suicidal ideas.  Allergic/Immunologic: Negative for HIV exposure and persistent infections.    EKGs/Labs/Other Studies Reviewed:    The following studies were reviewed today:   EKG:  None today   cardiac IMPRESSION: 1. Coronary calcium score of 0. This was 0 percentile for age and sex matched control.  2. Normal coronary origin with right dominance.  3. No evidence of CAD.  4.  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  5.  Mild mitral annular calcification.  5.  Aortic Atherosclerosis.  Alexandria Harmsen, DO  Recent Labs: 09/06/2019: TSH 2.16 11/13/2019: ALT 23; BUN 11; Creatinine, Ser 0.88; Hemoglobin 14.7; Platelets 337; Potassium 3.8; Sodium 136  Recent Lipid Panel    Component Value Date/Time   CHOL 193 09/06/2019 0825   TRIG 104.0 09/06/2019 0825   HDL 52.60 09/06/2019 0825   CHOLHDL 4 09/06/2019 0825   VLDL 20.8 09/06/2019 0825   LDLCALC 120 (H) 09/06/2019 0825    Physical Exam:    VS:  BP 116/80 (BP Location: Right Arm, Patient Position: Sitting, Cuff Size: Normal)    Pulse 88    Ht 5\' 4"  (1.626 m)    Wt 128 lb (58.1 kg)    SpO2 95%    BMI 21.97 kg/m     Wt Readings from Last 3 Encounters:  11/24/19 128 lb (58.1 kg)  11/13/19 126 lb (57.2 kg)  11/06/19 128 lb 12.8 oz (58.4 kg)     GEN: Well nourished, well developed in no acute distress HEENT: Normal NECK: No JVD; No carotid bruits LYMPHATICS: No lymphadenopathy CARDIAC: S1S2 noted,RRR, no murmurs, rubs, gallops RESPIRATORY:  Clear to auscultation without rales, wheezing or rhonchi  ABDOMEN: Soft, non-tender, non-distended, +bowel sounds, no guarding. EXTREMITIES: No edema, No cyanosis, no clubbing MUSCULOSKELETAL:  No deformity  SKIN: Warm and dry NEUROLOGIC:  Alert and oriented x 3, non-focal PSYCHIATRIC:  Normal affect, good insight  ASSESSMENT:    1. Mitral and aortic insufficiency   2. Aortic root dilatation (HCC)   3. Essential hypertension     PLAN:     1.  She appears to be doing well from a cardiovascular standpoint.  Does not have any chest pain.  Or shortness of breath worse than her baseline.  She is very GI given her worsening vomiting and worsening diarrhea.  She is following up with GI and she has another testing set up for next week.  Her  blood pressure deceptively in the office today no changes will be made to any medications.  No signs of any heart failure we will continue to patient on her current medication regimen which includes Lasix 40 mg twice daily.  In terms of her aortic dilatation repeat CTA in 1 year  The patient is in agreement with the above plan. The patient left the office in stable condition.  The patient will follow up in 1 year   Medication Adjustments/Labs and Tests Ordered: Current medicines are reviewed at length with the patient today.  Concerns regarding medicines are outlined above.  No orders of the defined types were placed in this encounter.  No orders of the defined types were placed in this encounter.   Patient Instructions  Medication Instructions:  No medication changes. *If you need a refill on your cardiac medications before your next appointment, please call your pharmacy*   Lab Work: None ordered If you have labs (blood work) drawn today and your tests are completely normal, you will receive your results only by:  [place] (if you have MyChart) OR  A paper copy in the mail If you have any lab test that is abnormal or we need to change your treatment, we will call you to review the results.   Testing/Procedures: None ordered   Follow-Up: At [facility], you and your health needs are our priority.  As part of our continuing mission to provide you with exceptional heart care, we have created designated Provider Care Teams.  These Care Teams include your primary Cardiologist (physician) and Advanced Practice Providers (APPs -  Physician Assistants and Nurse  Practitioners) who all work together to provide you with the care you need, when you need it.  We recommend signing up for the patient portal called "MyChart".  Sign up information is provided on this After Visit Summary.  MyChart is used to connect with patients for Virtual Visits (Telemedicine).  Patients are able to view lab/test results, encounter notes, upcoming appointments, etc.  Non-urgent messages can be sent to your provider as well.   To learn more about what you can do with MyChart, go to [email or web address].    Your next appointment:   1 year(s)  The format for your next appointment:   In Person  Provider:   Berniece Salines, DO   Other Instructions NA      Adopting a Healthy Lifestyle.  Know what a healthy weight is for you (roughly BMI <25) and aim to maintain this   Aim for 7+ servings of fruits and vegetables daily   65-80+ fluid ounces of water or unsweet tea for healthy kidneys   Limit to max 1 drink of alcohol per day; avoid smoking/tobacco   Limit animal fats in diet for cholesterol and heart health - choose grass fed whenever available   Avoid highly processed foods, and foods high in saturated/trans fats   Aim for low stress - take time to unwind and care for your mental health   Aim for 150 min of moderate intensity exercise weekly for heart health, and weights twice weekly for bone health   Aim for 7-9 hours of sleep daily   When it comes to diets, agreement about the perfect plan isnt easy to find, even among the experts. Experts at the [employer] developed an idea known as the Healthy Eating Plate. Just imagine a plate divided into logical, healthy portions.   The emphasis is on diet quality:  Load up on vegetables and fruits - one-half of your plate: Aim for color and variety, and remember that potatoes dont count.   Go for whole grains - one-quarter of your plate: Whole wheat, barley, wheat berries, quinoa, oats,  brown rice, and foods made with them. If you want pasta, go with whole wheat pasta.   Protein power - one-quarter of your plate: Fish, chicken, beans, and nuts are all healthy, versatile protein sources. Limit red meat.   The diet, however, does go beyond the plate, offering a few other suggestions.   Use healthy plant oils, such as olive, canola, soy, corn, sunflower and peanut. Check the labels, and avoid partially hydrogenated oil, which have unhealthy trans fats.   If youre thirsty, drink water. Coffee and tea are good in moderation, but skip sugary drinks and limit milk and dairy products to one or two daily servings.   The type of carbohydrate in the diet is more important than the amount. Some sources of carbohydrates, such as vegetables, fruits, whole grains, and beans-are healthier than others.   Finally, stay active  Signed, Berniece Salines, DO  11/24/2019 1:51 PM    [facility]

## 2019-11-24 NOTE — Patient Instructions (Addendum)
Medication Instructions:  No medication changes. *If you need a refill on your cardiac medications before your next appointment, please call your pharmacy*   Lab Work: None ordered If you have labs (blood work) drawn today and your tests are completely normal, you will receive your results only by: . MyChart Message (if you have MyChart) OR . A paper copy in the mail If you have any lab test that is abnormal or we need to change your treatment, we will call you to review the results.   Testing/Procedures: None ordered   Follow-Up: At [facility], you and your health needs are our priority.  As part of our continuing mission to provide you with exceptional heart care, we have created designated Provider Care Teams.  These Care Teams include your primary Cardiologist (physician) and Advanced Practice Providers (APPs -  Physician Assistants and Nurse Practitioners) who all work together to provide you with the care you need, when you need it.  We recommend signing up for the patient portal called "MyChart".  Sign up information is provided on this After Visit Summary.  MyChart is used to connect with patients for Virtual Visits (Telemedicine).  Patients are able to view lab/test results, encounter notes, upcoming appointments, etc.  Non-urgent messages can be sent to your provider as well.   To learn more about what you can do with MyChart, go to [email or web address].    Your next appointment:   1 year(s)  The format for your next appointment:   In Person  Provider:   Kardie Tobb, DO   Other Instructions NA  

## 2019-11-29 NOTE — Addendum Note (Signed)
Addended by: Kelle Darting A on: 11/29/2019 03:59 PM   Modules accepted: Orders

## 2019-11-30 ENCOUNTER — Other Ambulatory Visit: Payer: Self-pay

## 2019-11-30 ENCOUNTER — Other Ambulatory Visit: Payer: Medicare Other

## 2019-11-30 DIAGNOSIS — Z1231 Encounter for screening mammogram for malignant neoplasm of breast: Secondary | ICD-10-CM | POA: Diagnosis not present

## 2019-11-30 DIAGNOSIS — R739 Hyperglycemia, unspecified: Secondary | ICD-10-CM

## 2019-11-30 DIAGNOSIS — R197 Diarrhea, unspecified: Secondary | ICD-10-CM

## 2019-11-30 DIAGNOSIS — E785 Hyperlipidemia, unspecified: Secondary | ICD-10-CM

## 2019-12-01 LAB — LIPID PANEL
Cholesterol: 215 mg/dL — ABNORMAL HIGH (ref <200)
HDL: 52 mg/dL (ref >=50)
LDL Cholesterol (Calc): 136 mg/dL (calc) — ABNORMAL HIGH
Non-HDL Cholesterol (Calc): 163 mg/dL (calc) — ABNORMAL HIGH (ref <130)
Total CHOL/HDL Ratio: 4.1 (calc) (ref <5.0)
Triglycerides: 141 mg/dL (ref <150)

## 2019-12-01 LAB — TSH: TSH: 1.21 mIU/L (ref 0.40–4.50)

## 2019-12-01 LAB — COMPREHENSIVE METABOLIC PANEL
AG Ratio: 1.2 (calc) (ref 1.0–2.5)
ALT: 17 U/L (ref 6–29)
AST: 21 U/L (ref 10–35)
Albumin: 3.7 g/dL (ref 3.6–5.1)
Alkaline phosphatase (APISO): 61 U/L (ref 37–153)
BUN: 15 mg/dL (ref 7–25)
CO2: 33 mmol/L — ABNORMAL HIGH (ref 20–32)
Calcium: 9.2 mg/dL (ref 8.6–10.4)
Chloride: 100 mmol/L (ref 98–110)
Creat: 0.84 mg/dL (ref 0.60–0.93)
Globulin: 3.1 g/dL (calc) (ref 1.9–3.7)
Glucose, Bld: 96 mg/dL (ref 65–99)
Potassium: 3.9 mmol/L (ref 3.5–5.3)
Sodium: 141 mmol/L (ref 135–146)
Total Bilirubin: 0.5 mg/dL (ref 0.2–1.2)
Total Protein: 6.8 g/dL (ref 6.1–8.1)

## 2019-12-01 LAB — HEMOGLOBIN A1C
Hgb A1c MFr Bld: 5.4 % of total Hgb (ref <5.7)
Mean Plasma Glucose: 108 (calc)
eAG (mmol/L): 6 (calc)

## 2019-12-02 ENCOUNTER — Other Ambulatory Visit: Payer: Self-pay | Admitting: Family Medicine

## 2019-12-04 DIAGNOSIS — Z6822 Body mass index (BMI) 22.0-22.9, adult: Secondary | ICD-10-CM | POA: Diagnosis not present

## 2019-12-04 DIAGNOSIS — Z01419 Encounter for gynecological examination (general) (routine) without abnormal findings: Secondary | ICD-10-CM | POA: Diagnosis not present

## 2019-12-05 ENCOUNTER — Telehealth: Payer: Self-pay | Admitting: Family Medicine

## 2019-12-05 NOTE — Telephone Encounter (Signed)
Patient is calling and requesting a refill for Synthroid sent to [facility] on [address]. Also patient is requesting a call back regarding lab results, please advise. CB [phone]

## 2019-12-06 MED ORDER — SYNTHROID 112 MCG PO TABS
ORAL_TABLET | ORAL | 1 refills | Status: DC
Start: 2019-12-06 — End: 2020-08-14

## 2019-12-06 NOTE — Telephone Encounter (Signed)
Spoke with patient. Refills sent and advised about labs

## 2019-12-13 DIAGNOSIS — J8410 Pulmonary fibrosis, unspecified: Secondary | ICD-10-CM | POA: Diagnosis not present

## 2019-12-13 DIAGNOSIS — K6389 Other specified diseases of intestine: Secondary | ICD-10-CM | POA: Diagnosis not present

## 2019-12-13 DIAGNOSIS — Z86010 Personal history of colonic polyps: Secondary | ICD-10-CM | POA: Diagnosis not present

## 2019-12-13 DIAGNOSIS — R197 Diarrhea, unspecified: Secondary | ICD-10-CM | POA: Diagnosis not present

## 2019-12-13 DIAGNOSIS — K5900 Constipation, unspecified: Secondary | ICD-10-CM | POA: Diagnosis not present

## 2019-12-13 DIAGNOSIS — K5730 Diverticulosis of large intestine without perforation or abscess without bleeding: Secondary | ICD-10-CM | POA: Diagnosis not present

## 2019-12-14 ENCOUNTER — Telehealth: Payer: Self-pay | Admitting: Cardiology

## 2019-12-14 NOTE — Telephone Encounter (Signed)
STAT if patient feels like he/she is going to faint   1) Are you dizzy now? no  2) Do you feel faint or have you passed out? no  3) Do you have any other symptoms? SOB periodically  4) Have you checked your HR and BP (record if available)? No   Patient states she has continued to have dizziness.

## 2019-12-14 NOTE — Telephone Encounter (Signed)
Please send her a ZIo monitor for 2 weeks

## 2019-12-15 ENCOUNTER — Other Ambulatory Visit: Payer: Self-pay | Admitting: Family Medicine

## 2019-12-15 NOTE — Telephone Encounter (Signed)
Spoke to the patient just now and let her know that Dr. Harriet Masson would like for her to wear a heart monitor for two weeks. She verbalizes understanding and thanks me for the call back.

## 2019-12-15 NOTE — Telephone Encounter (Signed)
Left message on patients voicemail to please return our call.   

## 2019-12-20 ENCOUNTER — Telehealth: Payer: Self-pay | Admitting: Cardiology

## 2019-12-20 NOTE — Telephone Encounter (Signed)
Alexandria [name] is calling stating she never received her heart monitor. Please advise.

## 2019-12-20 NOTE — Telephone Encounter (Signed)
Pt called back ahd stat that she received the stickers to her heart monitor in a box but the actually heart monitor was not in the box

## 2019-12-20 NOTE — Telephone Encounter (Signed)
Left message on patients voicemail to please return our call.   Patients monitor was ordered on 12/14/19 and can take up to 7 business days to be delivered in the mail.

## 2019-12-22 ENCOUNTER — Telehealth: Payer: Self-pay | Admitting: Cardiology

## 2019-12-22 DIAGNOSIS — K5900 Constipation, unspecified: Secondary | ICD-10-CM | POA: Diagnosis not present

## 2019-12-22 DIAGNOSIS — G8929 Other chronic pain: Secondary | ICD-10-CM | POA: Diagnosis not present

## 2019-12-22 DIAGNOSIS — R109 Unspecified abdominal pain: Secondary | ICD-10-CM | POA: Diagnosis not present

## 2019-12-22 DIAGNOSIS — J849 Interstitial pulmonary disease, unspecified: Secondary | ICD-10-CM | POA: Diagnosis not present

## 2019-12-22 NOTE — Telephone Encounter (Signed)
Called patient. She reports for the pat 4 days she has been having swelling in her left leg, also a bruise the size of her paml has came upon calf. It is painful at times. No recent injury. No shortness of breath. She would like to know if this of concern. I will consult with Dr. Bettina Gavia and let her know how we would like to advise. She does take plavix 75 mg daily.

## 2019-12-22 NOTE — Telephone Encounter (Signed)
Left message for patient to return call.

## 2019-12-22 NOTE — Telephone Encounter (Signed)
I think she should double her diuretic and be worked into Dr. Tawni Carnes office early next week no change in Plavix

## 2019-12-22 NOTE — Telephone Encounter (Signed)
Patient called to let Dr. Harriet Masson or nurse about swelling in her legs. Please call back

## 2019-12-22 NOTE — Telephone Encounter (Signed)
Spoke with pt and after going through her box again realized that the monitor was in there. She was used to the preventice monitor that has strips separate from the monitor. She will put it on and send in when done.

## 2019-12-24 ENCOUNTER — Ambulatory Visit (INDEPENDENT_AMBULATORY_CARE_PROVIDER_SITE_OTHER): Payer: Medicare Other

## 2019-12-24 DIAGNOSIS — R42 Dizziness and giddiness: Secondary | ICD-10-CM

## 2019-12-25 ENCOUNTER — Ambulatory Visit (INDEPENDENT_AMBULATORY_CARE_PROVIDER_SITE_OTHER): Payer: Medicare Other | Admitting: Cardiology

## 2019-12-25 ENCOUNTER — Telehealth: Payer: Self-pay

## 2019-12-25 ENCOUNTER — Other Ambulatory Visit: Payer: Self-pay

## 2019-12-25 ENCOUNTER — Encounter: Payer: Self-pay | Admitting: Cardiology

## 2019-12-25 VITALS — BP 128/72 | HR 76 | Ht 63.5 in | Wt 131.1 lb

## 2019-12-25 DIAGNOSIS — I87009 Postthrombotic syndrome without complications of unspecified extremity: Secondary | ICD-10-CM | POA: Diagnosis not present

## 2019-12-25 DIAGNOSIS — R002 Palpitations: Secondary | ICD-10-CM | POA: Diagnosis not present

## 2019-12-25 DIAGNOSIS — I83899 Varicose veins of unspecified lower extremities with other complications: Secondary | ICD-10-CM | POA: Diagnosis not present

## 2019-12-25 DIAGNOSIS — I77810 Thoracic aortic ectasia: Secondary | ICD-10-CM

## 2019-12-25 DIAGNOSIS — M7989 Other specified soft tissue disorders: Secondary | ICD-10-CM | POA: Diagnosis not present

## 2019-12-25 DIAGNOSIS — M6281 Muscle weakness (generalized): Secondary | ICD-10-CM | POA: Diagnosis not present

## 2019-12-25 DIAGNOSIS — M79644 Pain in right finger(s): Secondary | ICD-10-CM | POA: Diagnosis not present

## 2019-12-25 NOTE — Patient Instructions (Signed)
Medication Instructions:  Your physician recommends that you continue on your current medications as directed. Please refer to the Current Medication list given to you today.  *If you need a refill on your cardiac medications before your next appointment, please call your pharmacy*   Lab Work: None If you have labs (blood work) drawn today and your tests are completely normal, you will receive your results only by: [place] MyChart Message (if you have MyChart) OR . A paper copy in the mail If you have any lab test that is abnormal or we need to change your treatment, we will call you to review the results.   Testing/Procedures: You will have a dvt study, ultrasound of your leg.    Follow-Up: At [facility], you and your health needs are our priority.  As part of our continuing mission to provide you with exceptional heart care, we have created designated Provider Care Teams.  These Care Teams include your primary Cardiologist (physician) and Advanced Practice Providers (APPs -  Physician Assistants and Nurse Practitioners) who all work together to provide you with the care you need, when you need it.  We recommend signing up for the patient portal called "MyChart".  Sign up information is provided on this After Visit Summary.  MyChart is used to connect with patients for Virtual Visits (Telemedicine).  Patients are able to view lab/test results, encounter notes, upcoming appointments, etc.  Non-urgent messages can be sent to your provider as well.   To learn more about what you can do with MyChart, go to [email or web address].    Your next appointment:   3 month(s)  The format for your next appointment:   In Person  Provider:   Berniece Salines, DO   Other Instructions

## 2019-12-25 NOTE — Progress Notes (Signed)
Cardiology Office Note:    Date:  12/25/2019   ID:  Alexandria [name], DOB Jan 23, 1946, MRN [id number]  PCP:  [name], DO  Cardiologist:  Jenne Campus, MD    Referring MD: Carollee Herter, Alferd Apa, *   No chief complaint on file. Have calf swelling  History of Present Illness:    Alexandria [name] is a 74 y.o. female with past medical history significant for TIA, history of DVT, paroxysmal supraventricular tachycardia, idiopathic pulmonary fibrosis, chronic venous insufficiency.  She requested to be seen because of bruising as well as swelling of left lower extremities.  There is minimal tenderness no warmth I do not feel any cords in there.  This is the left calf that bothers her.  No shortness of breath no pleuritic chest pain, otherwise seems to be doing well.  Past Medical History:  Diagnosis Date  . Arthritis   . Asthmatic bronchitis with acute exacerbation 02/06/2015  . Bronchial pneumonia   . Colon polyp   . Diverticulitis   . DVT (deep venous thrombosis) ([place])    lower extremity  . H/O cardiac catheterization 2004   Normal coronary arteries  . Heart murmur   . History of stress test 05/21/2011  . Hx of echocardiogram 01/27/2010   Normal Ef 55% the transmitral spectral doppler flow pattern is normal for age. the left ventricular wall motion is normal  . IPF (idiopathic pulmonary fibrosis) ([place]) 01/2018  . Migraines   . Pancreatitis   . Thyroid disease   . TIA (transient ischemic attack)    8 years ago    Past Surgical History:  Procedure Laterality Date  . BREAST BIOPSY     [facility]  . CARDIAC CATHETERIZATION     10/2013  . CATARACT EXTRACTION Right 03/22/2018  . LEFT HEART CATHETERIZATION WITH CORONARY ANGIOGRAM N/A 10/25/2013   Procedure: LEFT HEART CATHETERIZATION WITH CORONARY ANGIOGRAM;  Surgeon: Troy Sine, MD;  Location[facility] C[facility] LAB;  Service: Cardiovascular;  Laterality: N/A;  . NECK SURGERY     x's 2  . TOTAL KNEE ARTHROPLASTY       Bilateral x's 2  . VAGINAL HYSTERECTOMY  10/17/1998   [place] BRONCHOSCOPY Bilateral 01/24/2018   Procedure: VIDEO BRONCHOSCOPY WITH FLUORO;  Surgeon: [name], MD;  Location: [place];  Service: Cardiopulmonary;  Laterality: Bilateral;    Current Medications: Current Meds  Medication Sig  . benzonatate (TESSALON) 100 MG capsule Take 1 capsule (100 mg total) by mouth 3 (three) times daily as needed for cough.  . clopidogrel (PLAVIX) 75 MG tablet TAKE 1 TABLET BY MOUTH EVERY DAY  . dicyclomine (BENTYL) 10 MG capsule Take 1 capsule (10 mg total) by mouth 4 (four) times daily -  before meals and at bedtime. (Patient taking differently: Take 10 mg by mouth as needed. )  . furosemide (LASIX) 40 MG tablet TAKE 1 TABLET BY MOUTH TWICE A DAY  . KLOR-CON M20 20 MEQ tablet TAKE 1 TABLET (20 MEQ TOTAL) BY MOUTH 2 (TWO) TIMES DAILY. WILL TAKE 3 A DAILY ONLY AS NEEDED.  . metoprolol succinate (TOPROL-XL) 25 MG 24 hr tablet TAKE 1 TABLET BY MOUTH EVERY DAY  . Multiple Vitamin (MULTIVITAMIN) tablet Take 1 tablet by mouth every other day. Gluten free/Vegetarian Multivitamin  . OFEV 150 MG CAPS TAKE 1 CAPSULE BY MOUTH TWICE DAILY 12 HOURS APART WITH FOOD. SWALLOW WHOLE. CALL [phone] FOR REFILLS  . promethazine (PHENERGAN) 25 MG tablet as needed.  Marland Kitchen  SYNTHROID 112 MCG tablet TAKE 1 TABLET BY MOUTH EVERY DAY BEFORE BREAKFAST  . vitamin C (ASCORBIC ACID) 500 MG tablet Take 1,000 mg by mouth 2 (two) times daily.   . Vitamin D, Ergocalciferol, (DRISDOL) 1.25 MG (50000 UNIT) CAPS capsule Take 1 capsule (50,000 Units total) by mouth every 7 (seven) days. Take for 8 total doses(weeks)  . vitamin E 1000 UNIT capsule Take 1,000 Units by mouth daily.     Allergies:   Prednisone, Atrovent hfa [ipratropium bromide hfa], Cheratussin ac [guaifenesin-codeine], Codeine, Hydrocodone, Influenza vaccines, Motrin [ibuprofen], and Oxycodone-acetaminophen   Social History   Socioeconomic History   . Marital status: Married    Spouse name: [name]  . Number of children: 3  . Years of education: [employer]  . Highest education level: Not on file  Occupational History  . Occupation: DISABLED    Employer: UNEMPLOYED  Tobacco Use  . Smoking status: Never Smoker  . Smokeless tobacco: Never Used  Vaping Use  . Vaping Use: Never used  Substance and Sexual Activity  . Alcohol use: No    Alcohol/week: 0.0 standard drinks  . Drug use: No  . Sexual activity: Not on file  Other Topics Concern  . Not on file  Social History Narrative   Lives with husband [name]   Caffeine use: coffee (2 cups per day)   Mostly right-handed   Social Determinants of Health   Financial Resource Strain:   . Difficulty of Paying Living Expenses: Not on file  Food Insecurity:   . Worried About Charity fundraiser in the Last Year: Not on file  . Ran Out of Food in the Last Year: Not on file  Transportation Needs:   . Lack of Transportation (Medical): Not on file  . Lack of Transportation (Non-Medical): Not on file  Physical Activity:   . Days of Exercise per Week: Not on file  . Minutes of Exercise per Session: Not on file  Stress:   . Feeling of Stress : Not on file  Social Connections:   . Frequency of Communication with Friends and Family: Not on file  . Frequency of Social Gatherings with Friends and Family: Not on file  . Attends Religious Services: Not on file  . Active Member of Clubs or Organizations: Not on file  . Attends Archivist Meetings: Not on file  . Marital Status: Not on file     Family History: The patient's family history includes Arthritis in an other family member; Colon cancer in her brother and father; Diabetes in her maternal grandmother; HIV in her brother; Heart disease in her maternal grandmother; Hypertension in her maternal grandmother; Kidney cancer in her brother; Lung cancer in her brother; Other in her brother; Ovarian cancer in her mother; Prostate  cancer in her father; Stroke in her maternal grandmother; Uterine cancer in her mother. ROS:   Please see the history of present illness.    All 14 point review of systems negative except as described per history of present illness  EKGs/Labs/Other Studies Reviewed:      Recent Labs: 11/13/2019: Hemoglobin 14.7; Platelets 337 11/30/2019: ALT 17; BUN 15; Creat 0.84; Potassium 3.9; Sodium 141; TSH 1.21  Recent Lipid Panel    Component Value Date/Time   CHOL 215 (H) 11/30/2019 0937   TRIG 141 11/30/2019 0937   HDL 52 11/30/2019 0937   CHOLHDL 4.1 11/30/2019 0937   VLDL 20.8 09/06/2019 0825   LDLCALC 136 (H) 11/30/2019 2353  Physical Exam:    VS:  BP 128/72   Pulse 76   Ht 5' 3.5" (1.613 m)   Wt 131 lb 1.9 oz (59.5 kg)   SpO2 95%   BMI 22.86 kg/m     Wt Readings from Last 3 Encounters:  12/25/19 131 lb 1.9 oz (59.5 kg)  11/24/19 128 lb (58.1 kg)  11/13/19 126 lb (57.2 kg)     GEN:  Well nourished, well developed in no acute distress HEENT: Normal NECK: No JVD; No carotid bruits LYMPHATICS: No lymphadenopathy CARDIAC: RRR, no murmurs, no rubs, no gallops RESPIRATORY:  Clear to auscultation without rales, wheezing or rhonchi  ABDOMEN: Soft, non-tender, non-distended MUSCULOSKELETAL: Minimal swelling of both lower extremities there is some bruising to left calf.  Slightly worse swelling.  Minimal tenderness but no focal tenderness. SKIN: Warm and dry LOWER EXTREMITIES: no swelling NEUROLOGIC:  Alert and oriented x 3 PSYCHIATRIC:  Normal affect   ASSESSMENT:    1. Varicose veins of leg with complications   2. Post-phlebitic syndrome   3. Aortic root dilatation (HCC)   4. Palpitations    PLAN:    In order of problems listed above:  1. Varicose veins with some swelling asymmetries at the of the left calf with some bruising.  Of course high suspicion for potential DVT.  Will get DVT study today.  I initiated conversation about potentially anticoagulation.  She said  she want to wait until results of the test. 2. Aortic dilatation is a chronic problem she is scheduled to have repeated evaluation with the next months 3. Palpitations denies having any   Medication Adjustments/Labs and Tests Ordered: Current medicines are reviewed at length with the patient today.  Concerns regarding medicines are outlined above.  No orders of the defined types were placed in this encounter.  Medication changes: No orders of the defined types were placed in this encounter.   Signed, Park Liter, MD, [facility] 12/25/2019 3:46 PM    [place]

## 2019-12-25 NOTE — Addendum Note (Signed)
Addended by: Senaida Ores on: 12/25/2019 03:53 PM   Modules accepted: Orders

## 2019-12-25 NOTE — Telephone Encounter (Signed)
Pt is returning call.  

## 2019-12-25 NOTE — Telephone Encounter (Signed)
Spoke with the pt and she refuses an appt in [place] with Dr. Harriet Masson tomorrow... she says she moved out of [place] and lives in [place].   She says her left leg only has been swollen up to her knee with a large bruise behind her calf. She says it is aching in and it helps to prop it up on a pillow at night. She says it hurts under her foot, not in her calf, to walk on it.  No injury noted. This has been for about 4 days.   She declined to take added Lasix as per Dr. Joya Gaskins recommendation, she reports she is already taking enough Lasix 80 mg a day.  I have asked her to consider going to an Urgent Care for more immediate assessment but she was reluctant and reports she will wait to hear back from Dr. Terrial Rhodes recommendations.

## 2019-12-25 NOTE — Telephone Encounter (Signed)
Pt to be seen today in [facility] with Dr. Agustin Cree at 3 pm.

## 2019-12-25 NOTE — Telephone Encounter (Signed)
       I went in pt's chart to see who called her. I transferred call to Rockney Ghee.

## 2019-12-25 NOTE — Telephone Encounter (Signed)
Please check to see if she will be willing to see Dr. Agustin Cree this week in [place]. Also please ask her to go to the [facility] [place] ED if we are not able to get her in with Dr. Agustin Cree.

## 2019-12-25 NOTE — Telephone Encounter (Signed)
Left message for the pt to call back.. she can be seen 12/27/19 by Dr. Harriet Masson... will wait for pt to call back to schedule.

## 2019-12-26 ENCOUNTER — Ambulatory Visit (HOSPITAL_COMMUNITY)
Admission: RE | Admit: 2019-12-26 | Discharge: 2019-12-26 | Disposition: A | Discharge status: Home / Self Care | Payer: Medicare Other | Source: Ambulatory Visit | Attending: Cardiology | Admitting: Cardiology

## 2019-12-26 DIAGNOSIS — M7989 Other specified soft tissue disorders: Secondary | ICD-10-CM | POA: Insufficient documentation

## 2019-12-26 NOTE — Progress Notes (Signed)
Venous duplex completed.  Called results to [place] at Dr. Wendy Poet office.  June Leap, BS, RDMS, RVT

## 2020-01-01 DIAGNOSIS — M79644 Pain in right finger(s): Secondary | ICD-10-CM | POA: Diagnosis not present

## 2020-01-01 DIAGNOSIS — M6281 Muscle weakness (generalized): Secondary | ICD-10-CM | POA: Diagnosis not present

## 2020-01-03 DIAGNOSIS — M79644 Pain in right finger(s): Secondary | ICD-10-CM | POA: Diagnosis not present

## 2020-01-03 DIAGNOSIS — M6281 Muscle weakness (generalized): Secondary | ICD-10-CM | POA: Diagnosis not present

## 2020-01-04 DIAGNOSIS — R1112 Projectile vomiting: Secondary | ICD-10-CM | POA: Diagnosis not present

## 2020-01-04 DIAGNOSIS — R198 Other specified symptoms and signs involving the digestive system and abdomen: Secondary | ICD-10-CM | POA: Diagnosis not present

## 2020-01-09 ENCOUNTER — Telehealth: Payer: Self-pay | Admitting: Family Medicine

## 2020-01-09 DIAGNOSIS — R1084 Generalized abdominal pain: Secondary | ICD-10-CM

## 2020-01-09 DIAGNOSIS — R197 Diarrhea, unspecified: Secondary | ICD-10-CM

## 2020-01-09 DIAGNOSIS — E876 Hypokalemia: Secondary | ICD-10-CM

## 2020-01-09 NOTE — Telephone Encounter (Signed)
Pt has been seeing GI at [facility]. And should she continue Klor-con. Please advise

## 2020-01-09 NOTE — Telephone Encounter (Signed)
Patient states she would like a referral to [facility]. Patient is wondering if she should pick up klor-con or do she need blood work?

## 2020-01-09 NOTE — Telephone Encounter (Signed)
Ok to place referral  Yes-- take klorcon

## 2020-01-10 NOTE — Telephone Encounter (Signed)
Referral placed.

## 2020-01-10 NOTE — Addendum Note (Signed)
Addended by: Sanda Linger on: 01/10/2020 01:43 PM   Modules accepted: Orders

## 2020-01-11 ENCOUNTER — Other Ambulatory Visit: Payer: Self-pay | Admitting: Internal Medicine

## 2020-01-11 DIAGNOSIS — R0989 Other specified symptoms and signs involving the circulatory and respiratory systems: Secondary | ICD-10-CM | POA: Diagnosis not present

## 2020-01-11 DIAGNOSIS — J060 Acute laryngopharyngitis: Secondary | ICD-10-CM | POA: Diagnosis not present

## 2020-01-11 DIAGNOSIS — J84112 Idiopathic pulmonary fibrosis: Secondary | ICD-10-CM

## 2020-01-11 DIAGNOSIS — J0141 Acute recurrent pansinusitis: Secondary | ICD-10-CM | POA: Diagnosis not present

## 2020-01-11 DIAGNOSIS — Z20822 Contact with and (suspected) exposure to covid-19: Secondary | ICD-10-CM | POA: Diagnosis not present

## 2020-01-11 MED ORDER — KLOR-CON M20 20 MEQ PO TBCR: 20.0000 meq | EXTENDED_RELEASE_TABLET | Freq: Two times a day (BID) | ORAL | 1 refills | Status: DC

## 2020-01-11 NOTE — Telephone Encounter (Signed)
Patient notified that rx has been sent in. 

## 2020-01-11 NOTE — Addendum Note (Signed)
Addended by: Kem Boroughs D on: 01/11/2020 11:52 AM   Modules accepted: Orders

## 2020-01-11 NOTE — Telephone Encounter (Signed)
Medication: KLOR-CON M20 20 MEQ tablet [id number]  Has the patient contacted their pharmacy? No. (If no, request that the patient contact the pharmacy for the refill.) (If yes, when and what did the pharmacy advise?)  Preferred Pharmacy (with phone number or street name): [id number] [place], [address], [place] [address]  Phone:  [phone] Fax:  [phone]  DEA #:  [id number]  Agent: Please be advised that RX refills may take up to 3 business days. We ask that you follow-up with your pharmacy.

## 2020-01-12 ENCOUNTER — Other Ambulatory Visit: Payer: Self-pay | Admitting: Cardiology

## 2020-01-12 DIAGNOSIS — M79644 Pain in right finger(s): Secondary | ICD-10-CM | POA: Diagnosis not present

## 2020-01-12 DIAGNOSIS — R42 Dizziness and giddiness: Secondary | ICD-10-CM

## 2020-01-12 DIAGNOSIS — M6281 Muscle weakness (generalized): Secondary | ICD-10-CM | POA: Diagnosis not present

## 2020-01-15 ENCOUNTER — Telehealth: Payer: Self-pay

## 2020-01-15 ENCOUNTER — Telehealth: Payer: Self-pay | Admitting: *Deleted

## 2020-01-15 NOTE — Telephone Encounter (Signed)
-----   Message from Berniece Salines, DO sent at 01/14/2020 11:43 AM EDT ----- Your monitor showed some fast heart beat - Paroxysmal atrial tachycardia about 15 episodes. If you are still experiencing any palpitations we can try medication or otherwise we can discuss it when I see you in the office.

## 2020-01-15 NOTE — Telephone Encounter (Signed)
Patient stated that she needed a referral to [facility] and I believe she is going to one already who is Dr. Rolm Bookbinder and she now needs a referral.  Can we use the referral from 01/10/20 or do we need to put in a new one?

## 2020-01-15 NOTE — Telephone Encounter (Signed)
Spoke with patient regarding results and recommendation.  Patient verbalizes understanding and is agreeable to plan of care. Advised patient to call back with any issues or concerns.   Patient would like to see Dr. Harriet Masson prior to starting any medications. She requested that her appointment be moved up and I got her scheduled for 02/13/2020 in  with Dr. Harriet Masson. Patient is aware that this is in [place] and states that she is fine with this.    Encouraged patient to call back with any questions or concerns.

## 2020-01-17 DIAGNOSIS — M79644 Pain in right finger(s): Secondary | ICD-10-CM | POA: Diagnosis not present

## 2020-01-17 DIAGNOSIS — M6281 Muscle weakness (generalized): Secondary | ICD-10-CM | POA: Diagnosis not present

## 2020-01-26 DIAGNOSIS — H04123 Dry eye syndrome of bilateral lacrimal glands: Secondary | ICD-10-CM | POA: Diagnosis not present

## 2020-02-02 DIAGNOSIS — H43813 Vitreous degeneration, bilateral: Secondary | ICD-10-CM | POA: Diagnosis not present

## 2020-02-02 DIAGNOSIS — H43393 Other vitreous opacities, bilateral: Secondary | ICD-10-CM | POA: Diagnosis not present

## 2020-02-02 DIAGNOSIS — H35432 Paving stone degeneration of retina, left eye: Secondary | ICD-10-CM | POA: Diagnosis not present

## 2020-02-06 DIAGNOSIS — R109 Unspecified abdominal pain: Secondary | ICD-10-CM | POA: Diagnosis not present

## 2020-02-06 DIAGNOSIS — K5900 Constipation, unspecified: Secondary | ICD-10-CM | POA: Diagnosis not present

## 2020-02-13 ENCOUNTER — Ambulatory Visit: Payer: Medicare Other | Admitting: Cardiology

## 2020-02-16 DIAGNOSIS — R197 Diarrhea, unspecified: Secondary | ICD-10-CM | POA: Diagnosis not present

## 2020-02-19 ENCOUNTER — Telehealth: Payer: Self-pay | Admitting: Internal Medicine

## 2020-02-19 DIAGNOSIS — J84112 Idiopathic pulmonary fibrosis: Secondary | ICD-10-CM

## 2020-02-19 NOTE — Telephone Encounter (Signed)
Patient is aware of below message and voiced her understanding.  Order has been placed for PT.  Nothing further needed.

## 2020-02-19 NOTE — Telephone Encounter (Signed)
Yes this is fine.

## 2020-02-19 NOTE — Telephone Encounter (Signed)
Called and spoke with pt. Pt is requesting to have an order placed for her to have physical therapy. Pt said she has been going to the gym and she was told that she could get therapy for her lungs at the gym [facility].  MR, please advise on this.

## 2020-02-20 DIAGNOSIS — Z0289 Encounter for other administrative examinations: Secondary | ICD-10-CM | POA: Diagnosis not present

## 2020-02-20 DIAGNOSIS — Z1152 Encounter for screening for COVID-19: Secondary | ICD-10-CM | POA: Diagnosis not present

## 2020-02-22 DIAGNOSIS — M546 Pain in thoracic spine: Secondary | ICD-10-CM | POA: Diagnosis not present

## 2020-02-26 DIAGNOSIS — M546 Pain in thoracic spine: Secondary | ICD-10-CM | POA: Diagnosis not present

## 2020-03-04 ENCOUNTER — Other Ambulatory Visit: Payer: Self-pay

## 2020-03-04 DIAGNOSIS — G43909 Migraine, unspecified, not intractable, without status migrainosus: Secondary | ICD-10-CM | POA: Insufficient documentation

## 2020-03-04 DIAGNOSIS — J180 Bronchopneumonia, unspecified organism: Secondary | ICD-10-CM | POA: Insufficient documentation

## 2020-03-04 DIAGNOSIS — I82409 Acute embolism and thrombosis of unspecified deep veins of unspecified lower extremity: Secondary | ICD-10-CM | POA: Insufficient documentation

## 2020-03-04 DIAGNOSIS — M1990 Unspecified osteoarthritis, unspecified site: Secondary | ICD-10-CM | POA: Insufficient documentation

## 2020-03-04 DIAGNOSIS — R011 Cardiac murmur, unspecified: Secondary | ICD-10-CM | POA: Insufficient documentation

## 2020-03-04 DIAGNOSIS — E079 Disorder of thyroid, unspecified: Secondary | ICD-10-CM | POA: Insufficient documentation

## 2020-03-04 DIAGNOSIS — M546 Pain in thoracic spine: Secondary | ICD-10-CM | POA: Diagnosis not present

## 2020-03-04 DIAGNOSIS — K859 Acute pancreatitis without necrosis or infection, unspecified: Secondary | ICD-10-CM | POA: Insufficient documentation

## 2020-03-04 DIAGNOSIS — K635 Polyp of colon: Secondary | ICD-10-CM | POA: Insufficient documentation

## 2020-03-05 ENCOUNTER — Other Ambulatory Visit: Payer: Self-pay

## 2020-03-05 ENCOUNTER — Encounter: Payer: Self-pay | Admitting: Cardiology

## 2020-03-05 ENCOUNTER — Ambulatory Visit (INDEPENDENT_AMBULATORY_CARE_PROVIDER_SITE_OTHER): Payer: Medicare Other | Admitting: Cardiology

## 2020-03-05 VITALS — BP 122/86 | HR 80 | Ht 64.0 in | Wt 131.1 lb

## 2020-03-05 DIAGNOSIS — G459 Transient cerebral ischemic attack, unspecified: Secondary | ICD-10-CM | POA: Diagnosis not present

## 2020-03-05 DIAGNOSIS — I471 Supraventricular tachycardia: Secondary | ICD-10-CM | POA: Diagnosis not present

## 2020-03-05 DIAGNOSIS — I10 Essential (primary) hypertension: Secondary | ICD-10-CM | POA: Diagnosis not present

## 2020-03-05 DIAGNOSIS — I83899 Varicose veins of unspecified lower extremities with other complications: Secondary | ICD-10-CM | POA: Diagnosis not present

## 2020-03-05 DIAGNOSIS — I872 Venous insufficiency (chronic) (peripheral): Secondary | ICD-10-CM | POA: Diagnosis not present

## 2020-03-05 NOTE — Progress Notes (Signed)
Cardiology Office Note:    Date:  03/05/2020   ID:  Alexandria [name], DOB 03/13/1946, MRN [id number]  PCP:  Carollee Herter, Alferd Apa, DO  Cardiologist:  Berniece Salines, DO  Electrophysiologist:  None   Referring MD: Carollee Herter, Alferd Apa, *     History of Present Illness:    Alexandria [name] is a 74 y.o. female with a hx of TIA, DVT, paroxysmal ventricular tachycardia, idiopathic pulmonary fibrosis, chronic venous insufficiency is here today for follow-up visit.  The patient requested to be seen on December 25, 2019 at that time she was seen by my partner due to leg swelling which was asymmetrical.  At the end of the visit a DVT study was recommended and the patient thankfully did not have any DVT.  In the interim she did wear a monitor which show evidence of paroxysmal atrial tachycardia with variable block.  She is here today for follow-up visit.  Today she tells me she has been experiencing increasing palpitation but fortunately she has not been taking her metoprolol.  In addition she has had worsening diarrhea when she eats she is following GI for work-up.  Past Medical History:  Diagnosis Date  . Arthritis   . Asthmatic bronchitis with acute exacerbation 02/06/2015  . Bronchial pneumonia   . Colon polyp   . Diverticulitis   . DVT (deep venous thrombosis) ([place])    lower extremity  . H/O cardiac catheterization 2004   Normal coronary arteries  . Heart murmur   . History of stress test 05/21/2011  . Hx of echocardiogram 01/27/2010   Normal Ef 55% the transmitral spectral doppler flow pattern is normal for age. the left ventricular wall motion is normal  . IPF (idiopathic pulmonary fibrosis) ([place]) 01/2018  . Migraines   . Pancreatitis   . Thyroid disease   . TIA (transient ischemic attack)    8 years ago    Past Surgical History:  Procedure Laterality Date  . BREAST BIOPSY     [facility]  . CARDIAC CATHETERIZATION     10/2013  . CATARACT EXTRACTION Right 03/22/2018    . LEFT HEART CATHETERIZATION WITH CORONARY ANGIOGRAM N/A 10/25/2013   Procedure: LEFT HEART CATHETERIZATION WITH CORONARY ANGIOGRAM;  Surgeon: Troy Sine, MD;  Location[facility] C[facility] LAB;  Service: Cardiovascular;  Laterality: N/A;  . NECK SURGERY     x's 2  . TOTAL KNEE ARTHROPLASTY     Bilateral x's 2  . VAGINAL HYSTERECTOMY  10/17/1998   [place] BRONCHOSCOPY Bilateral 01/24/2018   Procedure: VIDEO BRONCHOSCOPY WITH FLUORO;  Surgeon: [name], MD;  Location: [facility];  Service: Cardiopulmonary;  Laterality: Bilateral;    Current Medications: Current Meds  Medication Sig  . benzonatate (TESSALON) 100 MG capsule Take 1 capsule (100 mg total) by mouth 3 (three) times daily as needed for cough.  . brompheniramine-pseudoephedrine-DM 30-2-10 MG/5ML syrup Take 5 mLs by mouth every 4 (four) hours.  . clopidogrel (PLAVIX) 75 MG tablet TAKE 1 TABLET BY MOUTH EVERY DAY  . dicyclomine (BENTYL) 10 MG capsule Take 1 capsule (10 mg total) by mouth 4 (four) times daily -  before meals and at bedtime. (Patient taking differently: Take 10 mg by mouth as needed. )  . furosemide (LASIX) 40 MG tablet TAKE 1 TABLET BY MOUTH TWICE A DAY  . KLOR-CON M20 20 MEQ tablet Take 1 tablet (20 mEq total) by mouth 2 (two) times daily. Will take 3 a daily only  as needed.  . metoprolol succinate (TOPROL-XL) 25 MG 24 hr tablet TAKE 1 TABLET BY MOUTH EVERY DAY  . Multiple Vitamin (MULTIVITAMIN) tablet Take 1 tablet by mouth every other day. Gluten free/Vegetarian Multivitamin  . OFEV 150 MG CAPS TAKE 1 CAPSULE BY MOUTH TWICE DAILY 12 HOURS APART WITH FOOD. CALL [phone] FOR REFILLS.  Marland Kitchen ondansetron (ZOFRAN-ODT) 4 MG disintegrating tablet Take 4 mg by mouth as needed.  . promethazine (PHENERGAN) 25 MG tablet as needed.  Marland Kitchen SYNTHROID 112 MCG tablet TAKE 1 TABLET BY MOUTH EVERY DAY BEFORE BREAKFAST  . vitamin C (ASCORBIC ACID) 500 MG tablet Take 1,000 mg by mouth 2 (two) times daily.   . Vitamin D,  Ergocalciferol, (DRISDOL) 1.25 MG (50000 UNIT) CAPS capsule Take 1 capsule (50,000 Units total) by mouth every 7 (seven) days. Take for 8 total doses(weeks)  . vitamin E 1000 UNIT capsule Take 1,000 Units by mouth daily.  Marland Kitchen XIFAXAN 550 MG TABS tablet Take 550 mg by mouth 3 (three) times daily.     Allergies:   Prednisone, Atrovent hfa [ipratropium bromide hfa], Cheratussin ac [guaifenesin-codeine], Codeine, Hydrocodone, Influenza vaccines, Motrin [ibuprofen], and Oxycodone-acetaminophen   Social History   Socioeconomic History  . Marital status: Married    Spouse name: [name]  . Number of children: 3  . Years of education: [employer]  . Highest education level: Not on file  Occupational History  . Occupation: DISABLED    Employer: UNEMPLOYED  Tobacco Use  . Smoking status: Never Smoker  . Smokeless tobacco: Never Used  Vaping Use  . Vaping Use: Never used  Substance and Sexual Activity  . Alcohol use: No    Alcohol/week: 0.0 standard drinks  . Drug use: No  . Sexual activity: Not on file  Other Topics Concern  . Not on file  Social History Narrative   Lives with husband [name]   Caffeine use: coffee (2 cups per day)   Mostly right-handed   Social Determinants of Health   Financial Resource Strain:   . Difficulty of Paying Living Expenses: Not on file  Food Insecurity:   . Worried About Charity fundraiser in the Last Year: Not on file  . Ran Out of Food in the Last Year: Not on file  Transportation Needs:   . Lack of Transportation (Medical): Not on file  . Lack of Transportation (Non-Medical): Not on file  Physical Activity:   . Days of Exercise per Week: Not on file  . Minutes of Exercise per Session: Not on file  Stress:   . Feeling of Stress : Not on file  Social Connections:   . Frequency of Communication with Friends and Family: Not on file  . Frequency of Social Gatherings with Friends and Family: Not on file  . Attends Religious Services: Not on file  .  Active Member of Clubs or Organizations: Not on file  . Attends Archivist Meetings: Not on file  . Marital Status: Not on file     Family History: The patient's family history includes Arthritis in an other family member; Colon cancer in her brother and father; Diabetes in her maternal grandmother; HIV in her brother; Heart disease in her maternal grandmother; Hypertension in her maternal grandmother; Kidney cancer in her brother; Lung cancer in her brother; Other in her brother; Ovarian cancer in her mother; Prostate cancer in her father; Stroke in her maternal grandmother; Uterine cancer in her mother.  ROS:   Review of Systems  Constitution: Negative for decreased appetite, fever and weight gain.  HENT: Negative for congestion, ear discharge, hoarse voice and sore throat.   Eyes: Negative for discharge, redness, vision loss in right eye and visual halos.  Cardiovascular: Negative for chest pain, dyspnea on exertion, leg swelling, orthopnea and palpitations.  Respiratory: Negative for cough, hemoptysis, shortness of breath and snoring.   Endocrine: Negative for heat intolerance and polyphagia.  Hematologic/Lymphatic: Negative for bleeding problem. Does not bruise/bleed easily.  Skin: Negative for flushing, nail changes, rash and suspicious lesions.  Musculoskeletal: Negative for arthritis, joint pain, muscle cramps, myalgias, neck pain and stiffness.  Gastrointestinal: Negative for abdominal pain, bowel incontinence, diarrhea and excessive appetite.  Genitourinary: Negative for decreased libido, genital sores and incomplete emptying.  Neurological: Negative for brief paralysis, focal weakness, headaches and loss of balance.  Psychiatric/Behavioral: Negative for altered mental status, depression and suicidal ideas.  Allergic/Immunologic: Negative for HIV exposure and persistent infections.    EKGs/Labs/Other Studies Reviewed:    The following studies were reviewed  today:   EKG: None today  ZIO monitor The minimum heart rate was 43 bpm, maximum heart rate was 171 bpm, and average heart rate was 68 bpm. Predominant underlying rhythm was Sinus Rhythm.   Rhythm. Bundle Branch Block/IVCD was present.   15 Supraventricular Tachycardia runs occurred, the run with the fastest interval lasting 6 beats with a max rate of 171bpm, the longest lasting 10 beats with an avg rate of 103 bpm.   Premature atrial complexes  were rare (<1.0%).  Premature Ventricular complexes  were rare (<1.0%). No ventricular tachycardia, no pauses, No AV block and no atrial fibrillation present.  12 patient triggered events:  1 associated with Supraventricular tachycardia, 3 associated with premature atrial complex and the remaining associated with sinus rhythm. 2 diary events noted all associated with sinus rhythm.   Conclusion: This study is remarkable for supraventricular tachycardia which is likely atrial tachycardia with variable block.      Recent Labs: 11/13/2019: Hemoglobin 14.7; Platelets 337 11/30/2019: ALT 17; BUN 15; Creat 0.84; Potassium 3.9; Sodium 141; TSH 1.21  Recent Lipid Panel    Component Value Date/Time   CHOL 215 (H) 11/30/2019 0937   TRIG 141 11/30/2019 0937   HDL 52 11/30/2019 0937   CHOLHDL 4.1 11/30/2019 0937   VLDL 20.8 09/06/2019 0825   LDLCALC 136 (H) 11/30/2019 0937    Physical Exam:    VS:  BP 122/86   Pulse 80   Ht 5\' 4"  (1.626 m)   Wt 131 lb 1.6 oz (59.5 kg)   SpO2 98%   BMI 22.50 kg/m     Wt Readings from Last 3 Encounters:  03/05/20 131 lb 1.6 oz (59.5 kg)  12/25/19 131 lb 1.9 oz (59.5 kg)  11/24/19 128 lb (58.1 kg)     GEN: Well nourished, well developed in no acute distress HEENT: Normal NECK: No JVD; No carotid bruits LYMPHATICS: No lymphadenopathy CARDIAC: S1S2 noted,RRR, no murmurs, rubs, gallops RESPIRATORY:  Clear to auscultation without rales, wheezing or rhonchi  ABDOMEN: Soft, non-tender, non-distended,  +bowel sounds, no guarding. EXTREMITIES: No edema, No cyanosis, no clubbing MUSCULOSKELETAL:  No deformity  SKIN: Warm and dry NEUROLOGIC:  Alert and oriented x 3, non-focal PSYCHIATRIC:  Normal affect, good insight  ASSESSMENT:    1. Chronic venous insufficiency   2. Varicose veins of leg with complications   3. TIA (transient ischemic attack)   4. Essential hypertension   5. PAT (paroxysmal atrial tachycardia) ([place])  PLAN:    Her monitor did show paroxysmal atrial tachycardia and she admits to not taking her medication.  I explained to the patient that she would need to really start the beta-blocker first to see if there is any improvement.  She plans to do so. She is following with GI for her gastroenterology work-up. She does still have some bilateral leg edema she has been using compression stockings and that has improved.  I did explain to the patient that she could also get a medical grade compression stockings which will help her some days that she is active on her feet. The patient is in agreement with the above plan. The patient left the office in stable condition.  The patient will follow up in 6 months or sooner if needed.   Medication Adjustments/Labs and Tests Ordered: Current medicines are reviewed at length with the patient today.  Concerns regarding medicines are outlined above.  No orders of the defined types were placed in this encounter.  No orders of the defined types were placed in this encounter.   Patient Instructions  Medication Instructions:  Your physician recommends that you continue on your current medications as directed. Please refer to the Current Medication list given to you today.  *If you need a refill on your cardiac medications before your next appointment, please call your pharmacy*  Follow-Up: At [facility], you and your health needs are our priority.  As part of our continuing mission to provide you with exceptional heart care, we  have created designated Provider Care Teams.  These Care Teams include your primary Cardiologist (physician) and Advanced Practice Providers (APPs -  Physician Assistants and Nurse Practitioners) who all work together to provide you with the care you need, when you need it.  We recommend signing up for the patient portal called "MyChart".  Sign up information is provided on this After Visit Summary.  MyChart is used to connect with patients for Virtual Visits (Telemedicine).  Patients are able to view lab/test results, encounter notes, upcoming appointments, etc.  Non-urgent messages can be sent to your provider as well.   To learn more about what you can do with MyChart, go to [email or web address].    Your next appointment:   6 month(s)  The format for your next appointment:   In Person  Provider:   Berniece Salines, DO       Adopting a Healthy Lifestyle.  Know what a healthy weight is for you (roughly BMI <25) and aim to maintain this   Aim for 7+ servings of fruits and vegetables daily   65-80+ fluid ounces of water or unsweet tea for healthy kidneys   Limit to max 1 drink of alcohol per day; avoid smoking/tobacco   Limit animal fats in diet for cholesterol and heart health - choose grass fed whenever available   Avoid highly processed foods, and foods high in saturated/trans fats   Aim for low stress - take time to unwind and care for your mental health   Aim for 150 min of moderate intensity exercise weekly for heart health, and weights twice weekly for bone health   Aim for 7-9 hours of sleep daily   When it comes to diets, agreement about the perfect plan isnt easy to find, even among the experts. Experts at the [employer] developed an idea known as the Healthy Eating Plate. Just imagine a plate divided into logical, healthy portions.   The emphasis is on diet quality:  Load up on vegetables and fruits - one-half of your plate: Aim for color and  variety, and remember that potatoes dont count.   Go for whole grains - one-quarter of your plate: Whole wheat, barley, wheat berries, quinoa, oats, brown rice, and foods made with them. If you want pasta, go with whole wheat pasta.   Protein power - one-quarter of your plate: Fish, chicken, beans, and nuts are all healthy, versatile protein sources. Limit red meat.   The diet, however, does go beyond the plate, offering a few other suggestions.   Use healthy plant oils, such as olive, canola, soy, corn, sunflower and peanut. Check the labels, and avoid partially hydrogenated oil, which have unhealthy trans fats.   If youre thirsty, drink water. Coffee and tea are good in moderation, but skip sugary drinks and limit milk and dairy products to one or two daily servings.   The type of carbohydrate in the diet is more important than the amount. Some sources of carbohydrates, such as vegetables, fruits, whole grains, and beans-are healthier than others.   Finally, stay active  Signed, Berniece Salines, DO  03/05/2020 2:35 PM     [facility]

## 2020-03-05 NOTE — Patient Instructions (Signed)

## 2020-03-07 DIAGNOSIS — M546 Pain in thoracic spine: Secondary | ICD-10-CM | POA: Diagnosis not present

## 2020-03-11 ENCOUNTER — Telehealth: Payer: Self-pay | Admitting: Family Medicine

## 2020-03-11 ENCOUNTER — Other Ambulatory Visit: Payer: Self-pay | Admitting: Family Medicine

## 2020-03-11 ENCOUNTER — Ambulatory Visit: Payer: Medicare Other | Admitting: Family Medicine

## 2020-03-11 DIAGNOSIS — R739 Hyperglycemia, unspecified: Secondary | ICD-10-CM

## 2020-03-11 DIAGNOSIS — E785 Hyperlipidemia, unspecified: Secondary | ICD-10-CM

## 2020-03-11 DIAGNOSIS — I10 Essential (primary) hypertension: Secondary | ICD-10-CM

## 2020-03-11 NOTE — Telephone Encounter (Signed)
Orders in 

## 2020-03-11 NOTE — Telephone Encounter (Signed)
Patient has an appt schedule on 03/13/20. She would like to get labs first. Please advise

## 2020-03-11 NOTE — Telephone Encounter (Signed)
Please advise 

## 2020-03-12 DIAGNOSIS — R197 Diarrhea, unspecified: Secondary | ICD-10-CM | POA: Diagnosis not present

## 2020-03-12 DIAGNOSIS — R140 Abdominal distension (gaseous): Secondary | ICD-10-CM | POA: Diagnosis not present

## 2020-03-12 DIAGNOSIS — R159 Full incontinence of feces: Secondary | ICD-10-CM | POA: Diagnosis not present

## 2020-03-12 NOTE — Telephone Encounter (Signed)
Pt called. Pt states "today not a good day" and states going to [place] today.

## 2020-03-13 ENCOUNTER — Encounter: Payer: Self-pay | Admitting: Family Medicine

## 2020-03-13 ENCOUNTER — Other Ambulatory Visit: Payer: Self-pay

## 2020-03-13 ENCOUNTER — Telehealth (INDEPENDENT_AMBULATORY_CARE_PROVIDER_SITE_OTHER): Payer: Medicare Other | Admitting: Family Medicine

## 2020-03-13 DIAGNOSIS — I200 Unstable angina: Secondary | ICD-10-CM

## 2020-03-13 DIAGNOSIS — K911 Postgastric surgery syndromes: Secondary | ICD-10-CM | POA: Insufficient documentation

## 2020-03-13 DIAGNOSIS — R739 Hyperglycemia, unspecified: Secondary | ICD-10-CM | POA: Insufficient documentation

## 2020-03-13 NOTE — Progress Notes (Signed)
Virtual Visit via Video Note  I connected with Alexandria [name] on 03/13/20 at  8:20 AM EST by a video enabled telemedicine application and verified that I am speaking with the correct person using two identifiers.  Location/ persons in visit  Patient: home alone  Provider: home    I discussed the limitations of evaluation and management by telemedicine and the availability of in person appointments. The patient expressed understanding and agreed to proceed.  History of Present Illness: Pt is home f/u hyperglycemia--- she will schedule labs for next week Pt saw [facility] yesterday for bowel incontinence and is having more tests this week.   No other complaints    Observations/Objective: There were no vitals filed for this visit. Pt is in nad   Assessment and Plan: 1. Hyperglycemia Check labs next week  con't to watch simple sugars and starches   2. Dumping syndrome Per [facility]    Follow Up Instructions:    I discussed the assessment and treatment plan with the patient. The patient was provided an opportunity to ask questions and all were answered. The patient agreed with the plan and demonstrated an understanding of the instructions.   The patient was advised to call back or seek an in-person evaluation if the symptoms worsen or if the condition fails to improve as anticipated.  I provided 25 minutes of non-face-to-face time during this encounter.   [name], DO

## 2020-03-14 ENCOUNTER — Other Ambulatory Visit: Payer: Self-pay | Admitting: Family Medicine

## 2020-03-14 DIAGNOSIS — H33312 Horseshoe tear of retina without detachment, left eye: Secondary | ICD-10-CM | POA: Diagnosis not present

## 2020-03-14 DIAGNOSIS — H43392 Other vitreous opacities, left eye: Secondary | ICD-10-CM | POA: Diagnosis not present

## 2020-03-14 DIAGNOSIS — H43312 Vitreous membranes and strands, left eye: Secondary | ICD-10-CM | POA: Diagnosis not present

## 2020-03-14 DIAGNOSIS — H33332 Multiple defects of retina without detachment, left eye: Secondary | ICD-10-CM | POA: Diagnosis not present

## 2020-03-18 ENCOUNTER — Other Ambulatory Visit: Payer: Self-pay

## 2020-03-18 ENCOUNTER — Other Ambulatory Visit (INDEPENDENT_AMBULATORY_CARE_PROVIDER_SITE_OTHER): Payer: Medicare Other

## 2020-03-18 DIAGNOSIS — E785 Hyperlipidemia, unspecified: Secondary | ICD-10-CM | POA: Diagnosis not present

## 2020-03-18 DIAGNOSIS — I10 Essential (primary) hypertension: Secondary | ICD-10-CM

## 2020-03-18 DIAGNOSIS — L218 Other seborrheic dermatitis: Secondary | ICD-10-CM | POA: Diagnosis not present

## 2020-03-18 DIAGNOSIS — R739 Hyperglycemia, unspecified: Secondary | ICD-10-CM

## 2020-03-18 LAB — CBC WITH DIFFERENTIAL/PLATELET
Basophils Absolute: 0.1 10*3/uL (ref 0.0–0.1)
Basophils Relative: 1.2 % (ref 0.0–3.0)
Eosinophils Absolute: 0.6 10*3/uL (ref 0.0–0.7)
Eosinophils Relative: 7.9 % — ABNORMAL HIGH (ref 0.0–5.0)
HCT: 40.9 % (ref 36.0–46.0)
Hemoglobin: 14 g/dL (ref 12.0–15.0)
Lymphocytes Relative: 29.9 % (ref 12.0–46.0)
Lymphs Abs: 2.1 10*3/uL (ref 0.7–4.0)
MCHC: 34.1 g/dL (ref 30.0–36.0)
MCV: 98.3 fl (ref 78.0–100.0)
Monocytes Absolute: 0.9 10*3/uL (ref 0.1–1.0)
Monocytes Relative: 13.4 % — ABNORMAL HIGH (ref 3.0–12.0)
Neutro Abs: 3.4 10*3/uL (ref 1.4–7.7)
Neutrophils Relative %: 47.6 % (ref 43.0–77.0)
Platelets: 258 10*3/uL (ref 150.0–400.0)
RBC: 4.16 Mil/uL (ref 3.87–5.11)
RDW: 14 % (ref 11.5–15.5)
WBC: 7.1 10*3/uL (ref 4.0–10.5)

## 2020-03-18 LAB — COMPREHENSIVE METABOLIC PANEL
ALT: 26 U/L (ref 0–35)
AST: 25 U/L (ref 0–37)
Albumin: 3.6 g/dL (ref 3.5–5.2)
Alkaline Phosphatase: 60 U/L (ref 39–117)
BUN: 15 mg/dL (ref 6–23)
CO2: 33 mEq/L — ABNORMAL HIGH (ref 19–32)
Calcium: 9.1 mg/dL (ref 8.4–10.5)
Chloride: 99 mEq/L (ref 96–112)
Creatinine, Ser: 0.83 mg/dL (ref 0.40–1.20)
GFR: 69.04 mL/min (ref >60.00)
Glucose, Bld: 85 mg/dL (ref 70–99)
Potassium: 3.6 mEq/L (ref 3.5–5.1)
Sodium: 139 mEq/L (ref 135–145)
Total Bilirubin: 0.7 mg/dL (ref 0.2–1.2)
Total Protein: 6.3 g/dL (ref 6.0–8.3)

## 2020-03-18 LAB — LIPID PANEL
Cholesterol: 196 mg/dL (ref 0–200)
HDL: 58.7 mg/dL (ref >39.00)
LDL Cholesterol: 119 mg/dL — ABNORMAL HIGH (ref 0–99)
NonHDL: 137.22
Total CHOL/HDL Ratio: 3
Triglycerides: 91 mg/dL (ref 0.0–149.0)
VLDL: 18.2 mg/dL (ref 0.0–40.0)

## 2020-03-18 LAB — HEMOGLOBIN A1C: Hgb A1c MFr Bld: 5.4 % (ref 4.6–6.5)

## 2020-03-18 LAB — TSH: TSH: 1.89 u[IU]/mL (ref 0.35–4.50)

## 2020-03-20 ENCOUNTER — Ambulatory Visit: Payer: Medicare Other | Admitting: Internal Medicine

## 2020-03-22 DIAGNOSIS — H33312 Horseshoe tear of retina without detachment, left eye: Secondary | ICD-10-CM | POA: Diagnosis not present

## 2020-03-26 ENCOUNTER — Ambulatory Visit: Payer: Medicare Other | Admitting: Family Medicine

## 2020-03-27 ENCOUNTER — Other Ambulatory Visit: Payer: Self-pay | Admitting: Family Medicine

## 2020-03-27 DIAGNOSIS — R110 Nausea: Secondary | ICD-10-CM

## 2020-03-27 NOTE — Telephone Encounter (Signed)
Patient calling to check the status of medication 

## 2020-03-28 DIAGNOSIS — M546 Pain in thoracic spine: Secondary | ICD-10-CM | POA: Diagnosis not present

## 2020-04-03 ENCOUNTER — Telehealth: Payer: Self-pay | Admitting: Internal Medicine

## 2020-04-03 NOTE — Telephone Encounter (Signed)
Pt is checking the status of fax that was sent over on Dec 28th for OFEV that needs prior authorization.

## 2020-04-03 NOTE — Telephone Encounter (Addendum)
No faxes have been received.  Called [employer] and submitted Ofev 150mg  PA.  Received notification from [facility] regarding a prior authorization for OFEV 150mg . Authorization has been APPROVED from 01/04/20 to 04/03/21.   Authorization # [id number] Phone # [phone]   Called [facility] to advise, they were able to get a paid claim. Called patient and advised, nothing further needed.

## 2020-04-03 NOTE — Telephone Encounter (Signed)
Patient states [facility] sent the Ofev over thru a portal. Patient phone number is [phone].

## 2020-04-07 ENCOUNTER — Other Ambulatory Visit: Payer: Self-pay | Admitting: Family Medicine

## 2020-04-07 DIAGNOSIS — E876 Hypokalemia: Secondary | ICD-10-CM

## 2020-04-08 ENCOUNTER — Telehealth: Payer: Self-pay | Admitting: Internal Medicine

## 2020-04-08 DIAGNOSIS — Z20822 Contact with and (suspected) exposure to covid-19: Secondary | ICD-10-CM | POA: Diagnosis not present

## 2020-04-08 NOTE — Telephone Encounter (Signed)
Called and spoke with pt who stated she took the last of her OFEV 04/05/20. Pt said that she tried calling [facility] and they told her that a PA was needing to be done.  Due to all this, pt is now completely out of her med. Pt is wanting recommendations due to it possibly being 1/5-1/7 before she might be able to get shipment from [facility].  Routing to pharmacy team for advise.

## 2020-04-08 NOTE — Telephone Encounter (Signed)
Called [facility] to re-run Ofev prescription as prior Berkley Harvey has been renewed. Their system is currently updating and they're unable to run today. Will call back tomorrow. Advised patient that [facility] has note that they will deliver 04/11/20 so they said they should be able to get it to her with that anticipated ship date. Will f/u

## 2020-04-09 NOTE — Telephone Encounter (Signed)
[facility]ed [facility] to check on patient's Ofev shipment, rep Shelly advised that patient's medication will deliver by 9pm today via [employer].  Called patient and left message to advise.  [facility] Phone# [phone]

## 2020-04-10 DIAGNOSIS — M546 Pain in thoracic spine: Secondary | ICD-10-CM | POA: Diagnosis not present

## 2020-04-11 DIAGNOSIS — Z20822 Contact with and (suspected) exposure to covid-19: Secondary | ICD-10-CM | POA: Diagnosis not present

## 2020-04-11 DIAGNOSIS — H5710 Ocular pain, unspecified eye: Secondary | ICD-10-CM | POA: Diagnosis not present

## 2020-04-11 DIAGNOSIS — J0100 Acute maxillary sinusitis, unspecified: Secondary | ICD-10-CM | POA: Diagnosis not present

## 2020-04-11 DIAGNOSIS — R0981 Nasal congestion: Secondary | ICD-10-CM | POA: Diagnosis not present

## 2020-04-15 DIAGNOSIS — M546 Pain in thoracic spine: Secondary | ICD-10-CM | POA: Diagnosis not present

## 2020-04-19 DIAGNOSIS — M546 Pain in thoracic spine: Secondary | ICD-10-CM | POA: Diagnosis not present

## 2020-04-22 ENCOUNTER — Ambulatory Visit: Payer: Medicare Other | Admitting: Cardiology

## 2020-04-26 DIAGNOSIS — R197 Diarrhea, unspecified: Secondary | ICD-10-CM | POA: Diagnosis not present

## 2020-04-26 DIAGNOSIS — Z01812 Encounter for preprocedural laboratory examination: Secondary | ICD-10-CM | POA: Diagnosis not present

## 2020-04-26 DIAGNOSIS — Z20822 Contact with and (suspected) exposure to covid-19: Secondary | ICD-10-CM | POA: Diagnosis not present

## 2020-05-01 DIAGNOSIS — M546 Pain in thoracic spine: Secondary | ICD-10-CM | POA: Diagnosis not present

## 2020-05-03 DIAGNOSIS — R197 Diarrhea, unspecified: Secondary | ICD-10-CM | POA: Diagnosis not present

## 2020-05-03 DIAGNOSIS — R159 Full incontinence of feces: Secondary | ICD-10-CM | POA: Diagnosis not present

## 2020-05-03 DIAGNOSIS — R140 Abdominal distension (gaseous): Secondary | ICD-10-CM | POA: Diagnosis not present

## 2020-05-03 DIAGNOSIS — K6289 Other specified diseases of anus and rectum: Secondary | ICD-10-CM | POA: Diagnosis not present

## 2020-05-03 DIAGNOSIS — R109 Unspecified abdominal pain: Secondary | ICD-10-CM | POA: Diagnosis not present

## 2020-05-04 ENCOUNTER — Other Ambulatory Visit (HOSPITAL_COMMUNITY): Payer: Medicare Other

## 2020-05-06 DIAGNOSIS — M546 Pain in thoracic spine: Secondary | ICD-10-CM | POA: Diagnosis not present

## 2020-05-08 ENCOUNTER — Other Ambulatory Visit: Payer: Self-pay

## 2020-05-08 ENCOUNTER — Ambulatory Visit: Payer: Medicare Other | Admitting: Internal Medicine

## 2020-05-08 ENCOUNTER — Ambulatory Visit: Payer: Medicare Other

## 2020-05-10 ENCOUNTER — Other Ambulatory Visit (HOSPITAL_COMMUNITY)
Admission: RE | Admit: 2020-05-10 | Discharge: 2020-05-10 | Disposition: A | Discharge status: Home / Self Care | Payer: Medicare Other | Source: Ambulatory Visit | Attending: Internal Medicine | Admitting: Internal Medicine

## 2020-05-10 DIAGNOSIS — Z20822 Contact with and (suspected) exposure to covid-19: Secondary | ICD-10-CM | POA: Diagnosis not present

## 2020-05-10 DIAGNOSIS — Z01812 Encounter for preprocedural laboratory examination: Secondary | ICD-10-CM | POA: Insufficient documentation

## 2020-05-10 LAB — SARS CORONAVIRUS 2 (TAT 6-24 HRS): SARS Coronavirus 2: NEGATIVE

## 2020-05-13 DIAGNOSIS — M546 Pain in thoracic spine: Secondary | ICD-10-CM | POA: Diagnosis not present

## 2020-05-14 ENCOUNTER — Other Ambulatory Visit: Payer: Self-pay

## 2020-05-14 ENCOUNTER — Ambulatory Visit (INDEPENDENT_AMBULATORY_CARE_PROVIDER_SITE_OTHER): Payer: Medicare Other | Admitting: Internal Medicine

## 2020-05-14 ENCOUNTER — Encounter: Payer: Self-pay | Admitting: Internal Medicine

## 2020-05-14 VITALS — BP 130/70 | HR 68 | Ht 63.0 in | Wt 136.0 lb

## 2020-05-14 DIAGNOSIS — J84112 Idiopathic pulmonary fibrosis: Secondary | ICD-10-CM | POA: Diagnosis not present

## 2020-05-14 LAB — PULMONARY FUNCTION TEST
DL/VA % pred: 112 %
DL/VA: 4.62 ml/min/mmHg/L
DLCO cor % pred: 77 %
DLCO cor: 14.85 ml/min/mmHg
DLCO unc % pred: 77 %
DLCO unc: 14.85 ml/min/mmHg
FEF 25-75 Pre: 2 L/sec
FEF2575-%Pred-Pre: 120 %
FEV1-%Pred-Pre: 79 %
FEV1-Pre: 1.67 L
FEV1FVC-%Pred-Pre: 113 %
FEV6-%Pred-Pre: 73 %
FEV6-Pre: 1.96 L
FEV6FVC-%Pred-Pre: 105 %
FVC-%Pred-Pre: 70 %
FVC-Pre: 1.97 L
Pre FEV1/FVC ratio: 85 %
Pre FEV6/FVC Ratio: 100 %

## 2020-05-14 NOTE — Progress Notes (Signed)
Chief Complaint  Patient presents with  . Results    Discuss results of PFT - ILD (Interstitial lung disease)    Referring provider: [name], *  HPI:  75 year old female former smoker followed in office for idiopathic pulmonary fibrosis recently started on 0FEV  PMH: Hypothyroidism, MI Smoker/ Smoking History: Former Smoker. 4 pack years.  Maintenance:  OFEV, Dul[place] 100 Pt of: [place]    Synopsis: Former patient of Dr. Melvyn Novas with UACS and asthma who was found on chest imaging to have evidence of diffuse parenchymal lung disease.  She has a history of acid reflux and intolerance to antacids.  She treats her reflux with papaya. She says that she has had recurrent episodes of bronchitis on an annual basis for many years.  11/10/2018  - Visit   75 year old female former smoker followed in our office for IPF.  Patient is maintained on Ofev.  Patient is completing a visit today after completing pulmonary function test.  Pulmonary function test results are listed below:  11/10/2018-pulmonary function test-FVC 2.17 (75% predicted), postbronchodilator ratio 92, postbronchodilator FEV1 1.99 (91% predicted), no bronchodilator response, mid flow reversibility, DLCO 86  Patient is also followed by [facility] for management of her interstitial lung disease.  Patient is also been seen by Dr. Winn Jock at [facility] in [place].  [facility] assessment agreed that [facility] and our work-up is sufficient and to proceed forward with [facility]'s recommendations.  Patient reports that her breathing has been stable she has been walking every day.  She walked 2 miles yesterday including going up hills.  She is not having any issues at this time.    Tests:   Chest imaging: September 2019 high-resolution CT scan of the chest considered probable UIP, differential diagnosis includes nonspecific interstitial pneumonitis.  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  PFT: September 2019 lung function testing ratio normal, FEV1 1.9 L 86% predicted, FVC 2.20 L 76% predicted, total lung capacity 3.95 L 78% predicted, DLCO 16.1 mL 66% predicted  11/10/2018-pulmonary function test-FVC 2.17 (75% predicted), postbronchodilator ratio 92, postbronchodilator FEV1 1.99 (91% predicted), no bronchodilator response, mid flow reversibility, DLCO 86  Labs: - Allergy profile 11/30/2016 >  Eos 0.3 /  IgE   slightly elevated at 129, RAST profile negative otherwise December 31, 2017 hypersensitivity pneumonitis panel negative, anti-Jo 1-, CCP negative, rheumatoid factor negative, SSA negative, SSB negative, SCL 70-.  ANA negative 01/2018 CBC with diff 400 eosinophils, ANCA test negative October 2019 bronchoscopy: Minimal white blood cells on cell count and differential, cytology showed minimal inflammatory change, just typical macrophages.  No malignant change.  Cultures negative  6 Min walk 06/2018 534m, O2 saturation 96% RA  Path:  Echo:  Heart Catheterization:  SIX MIN WALK 11/10/2018 06/14/2018  Medications - Synthroid 171mcg @ 0730, OFEV 150mg  @ 0745, Papaya @ 0800 and 0830, Tessalon 100mg  @ 0815, Plavix 75mg  @ 0830  Supplimental Oxygen during Test? (L/min) No No  Laps - 15  Partial Lap (in Meters) - 20  Baseline BP (sitting) - 134/84  Baseline Heartrate - 67  Baseline Dyspnea (Borg Scale) - 0  Baseline Fatigue (Borg Scale) - 1  Baseline SPO2 - 97  BP (sitting) - 150/86  Heartrate - 45  Dyspnea (Borg Scale) - 0  Fatigue (Borg Scale) - 0.5  SPO2 - 96  BP (sitting) - 138/80  Heartrate - 75  SPO2 - 96  Stopped or Paused before Six  Minutes - No  Distance Completed - 530  Tech Comments: The patient was able to maintain a steady brisk walking pace and did not have to stop at any time. patient completed test at brisk pace, no rest breaks.  patient denied any symptoms of dyspnea, angina, dizzines, hip/leg/calf pain at end of test    OV 12/20/2018  Subjective:  Patient ID: Alexandria [name], female , DOB: [birth year] , age 58 y.o. , MRN: [id number] , ADDRESS: [address] [place] [address]   12/22/2018 -   Chief Complaint  Patient presents with  . IPF (idiopathic pulmonary fibrosis)      HPI Alexandria [name] 75 y.o. -transfer of care from Dr. Leilani Merl to Dr. Chase Caller in the ILD clinic.  This is because Dr. 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  Patient tells me that she is been given a diagnosis of IPF.  This is a clinical diagnosis without biopsy.  This is been supported both at [facility] and [facility].  I reviewed Dr. Lauris Chroman notes from end of 2019 and his diagnosis of IPF based on clinical grounds.  However she tells me that he did raise the possibility of non--IPF interstitial lung disease but I do not see this in the documentation.  She tells me since the diagnosis and starting nintedanib she is actually stable and is actually feeling better because of her intense exercise regimen.  She does have some difficulty tolerating nintedanib and 2 out of the 7 days because of abdominal pain she has only take 1 pill a day.  She relieves this abdominal pain with papaya.  She specifically wants to know from me if I support the diagnosis of IPF or if there alternate diagnosis I need to consider In addition she wanted to know about the role of nintedanib and fibrosis progression prevention.  However she is not interested in lung biopsy at this point.    [place] Integrated Comprehensive ILD Questionnaire  Symptoms:  -Insidious onset of  shortness of breath since June 2019 she states it is getting better because of extreme physical conditioning and working out.  No episodic dyspnea.  In fact currently she rates 0 dyspnea for rest or taking a shower or doing household work or shopping or walking at level at her own pace speaking.  No dyspnea for walking at level with others of age.  No dyspnea for walking up stairs or hill.  No other conditions are limiting her activities.  She did have a cough starting in June 2019 but it is better it is mild.  It is dry in quality occasionally clears her throat.  It is associated with allergies.  No other associated symptoms no wheezing.   Past Medical History : Positive for heart attack in December 2004.  Positive for thyroid disease.  Positive for stroke in October 2007.  Otherwise negative for asthma or COPD or rheumatoid arthritis or scleroderma or lupus or polymyositis or acid reflux or Sjogren's disease or sleep apnea or HIV or pulmonary hypertension or diabetes.  Negative for seizures tuberculosis kidney disease.  Negative for blood clots.  Negative for pleurisy  ROS:  -Positive for knee stiffness following knee replacement.  Sometimes she has dry eyes but otherwise no weight loss or fatigue color change in her fingers of fever or nausea vomiting or heartburn or rash or ulcers   FAMILY HISTORY of LUNG DISEASE: Positive for COPD but otherwise negative for fibrosis or asthma sarcoid   EXPOSURE HISTORY: She smokes cigarettes but quit in September 1971.  She does not smoke cigars or pipes or does any vaping.  No electronic cigarettes.  No marijuana.  No cocaine no intravenous drug use   HOME and HOBBY DETAILS : [place]..  Denies any damp living environment.  Denies mold or mildew in the shower curtain.  No humidifier use no CPAP use.  No nebulizer use.  No steam iron use no Jacuzzi use.  No Misty Fountain in the house.  She only has a cat for 19 years.   No pet gerbils.  No feather pillow use no feather duvet use.  No mold in the [facility] duct.  Does not play any musical instruments.  Does gardening   OCCUPATIONAL HISTORY (122 questions) : Essentially negative except gardening   PULMONARY TOXICITY HISTORY (27 items):  -Also negative.    CT chest high-resolution December 30, 2017 approximately 1 year ago: Personally visualized and agree with the suspicion of probable UIP  She has now joined a support group   Results for Alexandria, [name] (MRN [id number]) as of 12/20/2018 12:14  Ref. Range 11/30/2016 11:22 12/31/2017 14:24  Anti Nuclear Antibody (ANA) Latest Ref Range: NEGATIVE   NEGATIVE  Anti JO-1 Latest Ref Range: 0.0 - 0.9 AI  <9.2  Cyclic Citrullin Peptide Ab Latest Units: UNITS  <16  RA Latex Turbid. Latest Ref Range: <14 IU/mL  <14  IgE (Immunoglobulin E), Serum Latest Ref Range: <115 kU/L 129 (H)   SSA (Ro) (ENA) Antibody, IgG Latest Ref Range: <1.0 NEG AI  <1.0 NEG  SSB (La) (ENA) Antibody, IgG Latest Ref Range: <1.0 NEG AI  <1.0 NEG  Scleroderma (Scl-70) (ENA) Antibody, IgG Latest Ref Range: <1.0 NEG AI  <1.0 NEG   ROS - per HPI  OV 06/14/2019  Subjective:  Patient ID: Alexandria [name], female , DOB: 1945-05-30 , age 3 y.o. , MRN: [id number] , ADDRESS: [address] [address]   06/14/2019 -   Chief Complaint  Patient presents with  . Follow-up    Pt states she has been doing good since last visit. Pt denies any complaints of cough or CP.    Follow-up clinical diagnosis of IPF based on negative serology is greater than 65, negative history and also probable UIP on CT chest. HPI Alexandria [name] 75 y.o. -presents for follow-up last visit was in September 2020.  Since then overall stable.  She continues to walk a few miles a day.  Keeps herself active.  In the interim on Ash Wednesday she was at church and somebody bumped into her and she is fractured her right little finger.  Other than that she is  doing well.  She tolerates her nintedanib which has been taking since end of 2019 quite well.  She says after the night dose maybe 2 or 3 times a week she gets some abdominal pain that is mild and also some mild nausea that is sometimes moderate.  She lies down and it gets better.  She says she will try ginger capsules or ginger food in the past future  for this and see if this would help her.  There is no change in effort tolerance.  Symptom score is 0 except for nausea.  High-resolution CT scan of the chest shows no change compared to 2019.  Her FVC and walk test are stable.  There is a decline in the DLCO but she thinks is a technique issue from when she did in February 2021.  She had liver function test with her primary care physician February and this was normal.  She does not want to do the COVID-19 vaccine.  She is worried because she has had reaction to the flu shot which was never determined if it was flu shot or something else.  Her husband does have the COVID-19 vaccine.  SYMPTOM SCALE - ILD 06/14/2019   O2 use nond  Shortness of Breath 0 -> 5 scale with 5 being worst (score 6 If unable to do)  At rest 0  Simple tasks - showers, clothes change, eating, shaving 0  Household (dishes, doing bed, laundry) 0  Shopping 0  Walking level at own pace 0  Walking up Stairs 0  Total (30-36) Dyspnea Score 00  How bad is your cough? 0  How bad is your fatigue 0  How bad is nausea 0-2  How bad is vomiting?  00  How bad is diarrhea? 0  How bad is anxiety? 00  How bad is depression 0        Simple office walk 185 feet x  3 laps goal with forehead probe 06/14/2019   O2 used ra  Number laps completed 3  Comments about pace fast  Resting Pulse Ox/HR 100% and 83/min  Final Pulse Ox/HR 98% and 110/min  Desaturated </= 88% no  Desaturated <= 3% points no  Got Tachycardic >/= 90/min yes  Symptoms at end of test none  Miscellaneous comments z   Results for Alexandria, [name] (MRN  [id number]) as of 06/14/2019 11:46  Ref. Range 02/07/2019 12:18  Hemoglobin Latest Ref Range: 12.0 - 15.0 g/dL 13.4   Results for Alexandria, [name] (MRN [id number]) as of 06/14/2019 11:46  Ref. Range 05/29/2019 14:28  AST Latest Ref Range: 0 - 37 U/L 22  ALT Latest Ref Range: 0 - 35 U/L 19     Results for Alexandria, [name] (MRN [id number]) as of 06/14/2019 11:46  Ref. Range 12/30/2017 09:02 11/10/2018 08:38 05/10/2019 10:46  FVC-Pre Latest Units: L 2.10 2.17 2.15  FVC-%Pred-Pre Latest Units: % 72 75 75  Results for Alexandria, [name] (MRN [id number]) as of 06/14/2019 11:46  Ref. Range 12/30/2017 09:02 11/10/2018 08:38 05/10/2019 10:46  DLCO unc Latest Units: ml/min/mmHg 16.06 16.60 13.66  DLCO unc % pred Latest Units: % 66 86 71    EXAM: CT CHEST WITHOUT CONTRAST  TECHNIQUE: Multidetector CT imaging of the chest was performed following the standard protocol without intravenous contrast. High resolution imaging of the lungs, as well as inspiratory and expiratory imaging, was performed.  COMPARISON:  12/30/2017 high-resolution chest CT.  FINDINGS: Cardiovascular: Top-normal heart size. No significant pericardial effusion/thickening. Atherosclerotic thoracic aorta with stable ectatic 4.1 cm ascending thoracic aorta. Dilated main pulmonary artery (3.6 cm diameter).  Mediastinum/Nodes: Thyroid is either atrophic or surgically absent. Small amount of fluid in the mid to lower thoracic esophagus. No pathologically enlarged axillary, mediastinal or hilar lymph nodes, noting limited sensitivity for the detection of hilar adenopathy on this noncontrast study.  Lungs/Pleura: No pneumothorax. No pleural effusion. No acute consolidative airspace disease or lung  masses. Stable scattered tiny calcified granulomas in both lungs. A few scattered small solid pulmonary nodules in the right lung, largest 4 mm in the peripheral apical right upper lobe (series 3/image 34), all stable,  considered benign. No new significant pulmonary nodules. Moderate patchy confluent subpleural reticulation and ground-glass opacity throughout both lungs with associated minimal traction bronchiolectasis and architectural distortion. Slight basilar predominance to these findings. No frank honeycombing. No significant air trapping on the limited expiration sequence. No evidence of tracheobronchomalacia. No appreciable interval progression.  Upper abdomen: No acute abnormality.  Musculoskeletal: No aggressive appearing focal osseous lesions. Surgical hardware from ACDF in the lower cervical spine. Moderate to marked thoracic spondylosis.  IMPRESSION: 1. 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 No frank honeycombing. No appreciable interval disease progression. Differential remains fibrotic phase nonspecific interstitial pneumonia (NSIP) or usual interstitial pneumonia (UIP). Findings are categorized as probable UIP per consensus guidelines: Diagnosis of Idiopathic Pulmonary Fibrosis: An Official ATS/ERS/JRS/ALAT Clinical Practice Guideline. [place], Iss 5, [phone], Dec 05 2016. 2. Dilated main pulmonary artery, suggesting pulmonary arterial hypertension. 3. Stable ectatic 4.1 cm ascending thoracic aorta. Recommend annual imaging followup by CTA or MRA. This recommendation follows 2010 ACCF/AHA/AATS/ACR/ASA/SCA/SCAI/SIR/STS/SVM Guidelines for the Diagnosis and Management of Patients with Thoracic Aortic Disease. Circulation. 2010; 121: G867-Y195. Aortic aneurysm NOS (ICD10-I71.9).  Aortic Atherosclerosis (ICD10-I70.0).   Electronically Signed   By: Ilona Sorrel M.D.   On: 03/07/2019 10:29  ROS - per HPI   OV 05/14/2020  Subjective:  Patient ID: Alexandria [name], female , DOB: [birth year] , age 35 y.o. , MRN: [id number] , ADDRESS: [name] [place] [address] PCP  Carollee Herter, Alferd Apa, DO Patient Care Team: Carollee Herter, Alferd Apa, DO as PCP - General (Family Medicine) Berniece Salines, DO as PCP - Cardiology (Cardiology) Maisie Fus, MD as Consulting Physician (Obstetrics and Gynecology)  This Provider for this visit: Treatment Team:  Attending Provider: Brand Males, MD    05/14/2020 -   Chief Complaint  Patient presents with  . Follow-up    Patient did PFT today, feels good overall, no concerns that this time.      HPI Alexandria [name] 75 y.o. -presents for follow-up of her pulmonary fibrosis IPF.  Since I last saw her almost a year ago she feels stable.  Her symptom score is unchanged.  She has mild intermittent nausea and diarrhea.  The nausea is only once in a while.  The diarrhea happens occasionally.  When she takes Imodium it makes her bloated.  She says in 2014 she was diagnosed with dumping syndrome.  She therefore has to take small meals.  Earlier this month her last month she had to be off nintedanib for 5 days because of supply issues during this time symptoms only slightly better.  Therefore she does not think nintedanib is making any of her GI symptoms worse.  She still walks fast walking desaturation test was unchanged.  Her only problem with the nintedanib is that she not able to take strength anymore.  As soon as she does that she vomits.  But she does not want to change this medication.  Most recent liver function test in December 2021 with primary care physician was normal.  I reviewed the result.  Her last PFT was in December 2020.  Her pulmonary function test show slight decline over the last 2 years.  I shared this with her  but she says she is not feeling it in terms of walking desaturation test or symptom profile.    SYMPTOM SCALE - ILD 06/14/2019  05/14/2020   O2 use nond no  Shortness of Breath 0 -> 5 scale with 5 being worst (score 6 If unable to do)   At rest 0 0  Simple tasks - showers, clothes change, eating,  shaving 0 0  Household (dishes, doing bed, laundry) 0 0  Shopping 0 0  Walking level at own pace 0 0  Walking up Stairs 0 0  Total (30-36) Dyspnea Score 00 0  How bad is your cough? 0 0  How bad is your fatigue 0 0  How bad is nausea 0-2 0-3  How bad is vomiting?  00 0  How bad is diarrhea? 0 0-3  How bad is anxiety? 00 0  How bad is depression 0 0        Simple office walk 185 feet x  3 laps goal with forehead probe 06/14/2019  05/14/2020   O2 used ra ra  Number laps completed 3 3  Comments about pace fast Very fast  Resting Pulse Ox/HR 100% and 83/min 100% and 68/min  Final Pulse Ox/HR 98% and 110/min 100% and HR 88/min  Desaturated </= 88% no none  Desaturated <= 3% points no none  Got Tachycardic >/= 90/min yes none  Symptoms at end of test none none  Miscellaneous comments z      PFT  PFT Results Latest Ref Rng & Units 05/08/2020 05/10/2019 11/10/2018 12/30/2017  FVC-Pre L 1.97 2.15 2.17 2.10  FVC-Predicted Pre % 70 75 75 72  FVC-Post L - - 2.17 2.20  FVC-Predicted Post % - - 75 76  Pre FEV1/FVC % % 85 84 87 81  Post FEV1/FCV % % - - 92 85  FEV1-Pre L 1.67 1.81 1.90 1.70  FEV1-Predicted Pre % 79 84 87 78  FEV1-Post L - - 1.99 1.88  DLCO uncorrected ml/min/mmHg 14.85 13.66 16.60 16.06  DLCO UNC% % 77 71 86 66  DLCO corrected ml/min/mmHg 14.85 - - -  DLCO COR %Predicted % 77 - - -  DLVA Predicted % 112 102 119 100  TLC L - - 5.26 3.95  TLC % Predicted % - - 103 78  RV % Predicted % - - 134 85       has a past medical history of Arthritis, Asthmatic bronchitis with acute exacerbation (02/06/2015), Bronchial pneumonia, Colon polyp, Diverticulitis, DVT (deep venous thrombosis) ([place]), H/O cardiac catheterization (2004), Heart murmur, History of stress test (05/21/2011), echocardiogram (01/27/2010), IPF (idiopathic pulmonary fibrosis) ([place]) (01/2018), Migraines, Pancreatitis, Thyroid disease, and TIA (transient ischemic attack).   reports that she has never smoked.  She has never used smokeless tobacco.  Past Surgical History:  Procedure Laterality Date  . BREAST BIOPSY     Isaiah Blakes  . CARDIAC CATHETERIZATION     10/2013  . CATARACT EXTRACTION Right 03/22/2018  . LEFT HEART CATHETERIZATION WITH CORONARY ANGIOGRAM N/A 10/25/2013   Procedure: LEFT HEART CATHETERIZATION WITH CORONARY ANGIOGRAM;  Surgeon: Troy Sine, MD;  Location: [facility] CATH LAB;  Service: Cardiovascular;  Laterality: N/A;  . NECK SURGERY     x's 2  . TOTAL KNEE ARTHROPLASTY     Bilateral x's 2  . VAGINAL HYSTERECTOMY  10/17/1998   Eddy BRONCHOSCOPY Bilateral 01/24/2018   Procedure: VIDEO BRONCHOSCOPY WITH FLUORO;  Surgeon: [name], MD;  Location: [place];  Service:  Cardiopulmonary;  Laterality: Bilateral;    Allergies  Allergen Reactions  . Prednisone Other (See Comments)    Changed personality   . Atrovent Hfa [Ipratropium Bromide Hfa] Other (See Comments)    Pt could not sleep  . Cheratussin Ac [Guaifenesin-Codeine]   . Codeine Other (See Comments)    Pt takes promethazine with codeine at home  . Hydrocodone Itching  . Influenza Vaccines Other (See Comments)    Pt reports heart attack after last flu shot  . Motrin [Ibuprofen] Other (See Comments)    "Gives false reading in blood"  . Oxycodone-Acetaminophen Itching    Immunization History  Administered Date(s) Administered  . Td 04/06/2013  . Tdap 04/06/1998    Family History  Problem Relation Age of Onset  . Arthritis Other   . Colon cancer Father   . Prostate cancer Father   . HIV Brother        32  . Kidney cancer Brother   . Colon cancer Brother   . Lung cancer Brother   . Other Brother        Mouth Cancer  . Ovarian cancer Mother   . Uterine cancer Mother   . Heart disease Maternal Grandmother   . Stroke Maternal Grandmother   . Hypertension Maternal Grandmother   . Diabetes Maternal Grandmother      Current Outpatient Medications:  .  benzonatate (TESSALON) 100  MG capsule, Take 1 capsule (100 mg total) by mouth 3 (three) times daily as needed for cough., Disp: 90 capsule, Rfl: 6 .  brompheniramine-pseudoephedrine-DM 30-2-10 MG/5ML syrup, Take 5 mLs by mouth every 4 (four) hours., Disp: , Rfl:  .  clopidogrel (PLAVIX) 75 MG tablet, TAKE 1 TABLET BY MOUTH EVERY DAY, Disp: 90 tablet, Rfl: 1 .  dicyclomine (BENTYL) 10 MG capsule, Take 1 capsule (10 mg total) by mouth 4 (four) times daily -  before meals and at bedtime. (Patient taking differently: Take 10 mg by mouth as needed.), Disp: 90 capsule, Rfl: 1 .  furosemide (LASIX) 40 MG tablet, TAKE 1 TABLET BY MOUTH TWICE A DAY, Disp: 180 tablet, Rfl: 0 .  KLOR-CON M20 20 MEQ tablet, TAKE 1 TABLET (20 MEQ TOTAL) BY MOUTH 2 (TWO) TIMES DAILY. WILL TAKE 3 A DAILY ONLY AS NEEDED., Disp: 270 tablet, Rfl: 1 .  metoprolol succinate (TOPROL-XL) 25 MG 24 hr tablet, TAKE 1 TABLET BY MOUTH EVERY DAY, Disp: 90 tablet, Rfl: 1 .  Multiple Vitamin (MULTIVITAMIN) tablet, Take 1 tablet by mouth every other day. Gluten free/Vegetarian Multivitamin, Disp: , Rfl:  .  OFEV 150 MG CAPS, TAKE 1 CAPSULE BY MOUTH TWICE DAILY 12 HOURS APART WITH FOOD. CALL [phone] FOR REFILLS., Disp: 60 capsule, Rfl: 4 .  ondansetron (ZOFRAN-ODT) 4 MG disintegrating tablet, TAKE 1 TABLET BY MOUTH EVERY 8 HOURS AS NEEDED FOR NAUSEA AND VOMITING, Disp: 60 tablet, Rfl: 2 .  promethazine (PHENERGAN) 25 MG tablet, as needed., Disp: , Rfl:  .  SYNTHROID 112 MCG tablet, TAKE 1 TABLET BY MOUTH EVERY DAY BEFORE BREAKFAST, Disp: 90 tablet, Rfl: 1 .  vitamin C (ASCORBIC ACID) 500 MG tablet, Take 1,000 mg by mouth 2 (two) times daily. , Disp: , Rfl:  .  Vitamin D, Ergocalciferol, (DRISDOL) 1.25 MG (50000 UNIT) CAPS capsule, Take 1 capsule (50,000 Units total) by mouth every 7 (seven) days. Take for 8 total doses(weeks), Disp: 8 capsule, Rfl: 0 .  vitamin E 1000 UNIT capsule, Take 1,000 Units by mouth daily., Disp: , Rfl:  .  XIFAXAN 550 MG TABS tablet, Take 550 mg  by mouth 3 (three) times daily., Disp: , Rfl:       Objective:   Vitals:   05/14/20 1605  BP: 130/70  Pulse: 68  SpO2: 100%  Weight: 136 lb (61.7 kg)  Height: 5\' 3"  (1.6 m)    Estimated body mass index is 24.09 kg/m as calculated from the following:   Height as of this encounter: 5\' 3"  (1.6 m).   Weight as of this encounter: 136 lb (61.7 kg).  @WEIGHTCHANGE @  [employer]   05/14/20 1605  Weight: 136 lb (61.7 kg)     Physical Exam General: No distress. Looks well Neuro: Alert and Oriented x 3. GCS 15. Speech normal Psych: Pleasant Resp:  Barrel Chest - no.  Wheeze - no, Crackles -barely audible if at all, No overt respiratory distress CVS: Normal heart sounds. Murmurs - no Ext: Stigmata of Connective Tissue Disease - no HEENT: Normal upper airway. PEERL +. No post nasal drip        Assessment:       ICD-10-CM   1. Idiopathic pulmonary fibrosis ([place])  J84.112        Plan:     Patient Instructions    Clinically stable with symptoms, and walk test  But pulmonary function test suggest possible worsening of lung function in the last 2 years  LFT normal feb 2021 and December 2021   pLAN - continue ofev 150mg  twice daily  - try ginger either food or capsule for night nausea  - next LFT check in 3 months  -We discussed several options    -at this point in time he will reflect on whether he want to go ahead with a high-resolution CT or not  -Clinical trials as a care option and you are going to reflect on this  r  fOLLOWUP -Liver function test in 3 months - 6 months do spiro/dlco - 6 months with Dr Chase Caller - 30 min slot  -ILD symptom score and simple walk test at follow-up   SIGNATURE    Dr. Brand Males, M.D., F.C.C.P,  Pulmonary and Critical Care Medicine Staff Physician, [facility] Director - Interstitial Lung Disease  Program  Pulmonary [facility] [place], [place],  [address]  Pager: [phone], If no answer or between  15:00h - 7:00h: call [phone] Telephone: [phone]  4:57 PM 05/14/2020

## 2020-05-14 NOTE — Patient Instructions (Addendum)
   Clinically stable with symptoms, and walk test  But pulmonary function test suggest possible worsening of lung function in the last 2 years  LFT normal feb 2021 and December 2021   pLAN - continue ofev 150mg  twice daily  - try ginger either food or capsule for night nausea  - next LFT check in 3 months  -We discussed several options    -at this point in time he will reflect on whether he want to go ahead with a high-resolution CT or not  -Clinical trials as a care option and you are going to reflect on this  r  fOLLOWUP -Liver function test in 3 months - 6 months do spiro/dlco - 6 months with Dr Chase Caller - 30 min slot  -ILD symptom score and simple walk test at follow-up

## 2020-05-14 NOTE — Addendum Note (Signed)
Addended by: Lia Foyer R on: 05/14/2020 05:06 PM   Modules accepted: Orders

## 2020-05-14 NOTE — Progress Notes (Signed)
Spirometry/DLCO performed today. 

## 2020-05-20 DIAGNOSIS — M546 Pain in thoracic spine: Secondary | ICD-10-CM | POA: Diagnosis not present

## 2020-05-21 DIAGNOSIS — R159 Full incontinence of feces: Secondary | ICD-10-CM | POA: Diagnosis not present

## 2020-05-21 DIAGNOSIS — R278 Other lack of coordination: Secondary | ICD-10-CM | POA: Diagnosis not present

## 2020-05-24 ENCOUNTER — Other Ambulatory Visit: Payer: Self-pay

## 2020-05-24 ENCOUNTER — Ambulatory Visit (INDEPENDENT_AMBULATORY_CARE_PROVIDER_SITE_OTHER): Payer: Medicare Other | Admitting: Cardiology

## 2020-05-24 ENCOUNTER — Encounter: Payer: Self-pay | Admitting: Cardiology

## 2020-05-24 VITALS — BP 130/70 | HR 73 | Ht 63.0 in | Wt 135.0 lb

## 2020-05-24 DIAGNOSIS — I10 Essential (primary) hypertension: Secondary | ICD-10-CM

## 2020-05-24 DIAGNOSIS — R002 Palpitations: Secondary | ICD-10-CM

## 2020-05-24 DIAGNOSIS — I351 Nonrheumatic aortic (valve) insufficiency: Secondary | ICD-10-CM

## 2020-05-24 DIAGNOSIS — R0602 Shortness of breath: Secondary | ICD-10-CM | POA: Diagnosis not present

## 2020-05-24 DIAGNOSIS — I471 Supraventricular tachycardia: Secondary | ICD-10-CM

## 2020-05-24 DIAGNOSIS — I714 Abdominal aortic aneurysm, without rupture, unspecified: Secondary | ICD-10-CM

## 2020-05-24 MED ORDER — CARDIZEM LA 120 MG PO TB24: 120.0000 mg | ORAL_TABLET | Freq: Every day | ORAL | 3 refills | Status: DC

## 2020-05-24 NOTE — Progress Notes (Signed)
Cardiology Office Note:    Date:  05/24/2020   ID:  Alexandria [name], DOB May 09, 1945, MRN [id number]  PCP:  Carollee Herter, Alferd Apa, DO  Cardiologist:  Berniece Salines, DO  Electrophysiologist:  None   Referring MD: Carollee Herter, Alferd Apa, *   Chief Complaint  Patient presents with  . Palpitations    History of Present Illness:    Alexandria [name] is a 75 y.o. female with a hx of TIA, DVT, paroxysmal atrial tachycardia, chronic venous insufficiency, varicose veins, hypertension is here today for follow-up visit.  I saw the patient March 05, 2020 at that time she had not been taking the beta-blocker but experiencing palpitations.  We also discussed her varicose veins and bilateral leg edema and I recommended compression stocking.  Is here today for follow-up visit.  She tells me that she still is experiencing some palpitations.  Despite the start of her metoprolol.  She is up to 25 mg daily and she believes that this is not helping her.  She also reported she is experiencing worsening shortness of breath on exertion.  Past Medical History:  Diagnosis Date  . Arthritis   . Asthmatic bronchitis with acute exacerbation 02/06/2015  . Bronchial pneumonia   . Colon polyp   . Diverticulitis   . DVT (deep venous thrombosis) ([place])    lower extremity  . H/O cardiac catheterization 2004   Normal coronary arteries  . Heart murmur   . History of stress test 05/21/2011  . Hx of echocardiogram 01/27/2010   Normal Ef 55% the transmitral spectral doppler flow pattern is normal for age. the left ventricular wall motion is normal  . IPF (idiopathic pulmonary fibrosis) ([place]) 01/2018  . Migraines   . Pancreatitis   . Thyroid disease   . TIA (transient ischemic attack)    8 years ago    Past Surgical History:  Procedure Laterality Date  . BREAST BIOPSY     [facility]  . CARDIAC CATHETERIZATION     10/2013  . CATARACT EXTRACTION Right 03/22/2018  . LEFT HEART CATHETERIZATION WITH  CORONARY ANGIOGRAM N/A 10/25/2013   Procedure: LEFT HEART CATHETERIZATION WITH CORONARY ANGIOGRAM;  Surgeon: Troy Sine, MD;  Location: [facility] CATH LAB;  Service: Cardiovascular;  Laterality: N/A;  . NECK SURGERY     x's 2  . TOTAL KNEE ARTHROPLASTY     Bilateral x's 2  . VAGINAL HYSTERECTOMY  10/17/1998   [place] BRONCHOSCOPY Bilateral 01/24/2018   Procedure: VIDEO BRONCHOSCOPY WITH FLUORO;  Surgeon: [name], MD;  Location: [facility];  Service: Cardiopulmonary;  Laterality: Bilateral;    Current Medications: Current Meds  Medication Sig  . benzonatate (TESSALON) 100 MG capsule Take 1 capsule (100 mg total) by mouth 3 (three) times daily as needed for cough.  . Cholecalciferol (VITAMIN D3) 125 MCG (5000 UT) CAPS Take 5,000 Units by mouth daily.  . clopidogrel (PLAVIX) 75 MG tablet TAKE 1 TABLET BY MOUTH EVERY DAY  . diltiazem (CARDIZEM LA) 120 MG 24 hr tablet Take 1 tablet (120 mg total) by mouth daily.  . furosemide (LASIX) 40 MG tablet TAKE 1 TABLET BY MOUTH TWICE A DAY  . KLOR-CON M20 20 MEQ tablet TAKE 1 TABLET (20 MEQ TOTAL) BY MOUTH 2 (TWO) TIMES DAILY. WILL TAKE 3 A DAILY ONLY AS NEEDED.  . Multiple Vitamin (MULTIVITAMIN) tablet Take 1 tablet by mouth every other day. Gluten free/Vegetarian Multivitamin  . OFEV 150 MG CAPS TAKE 1 CAPSULE  BY MOUTH TWICE DAILY 12 HOURS APART WITH FOOD. CALL [phone] FOR REFILLS.  Marland Kitchen ondansetron (ZOFRAN-ODT) 4 MG disintegrating tablet TAKE 1 TABLET BY MOUTH EVERY 8 HOURS AS NEEDED FOR NAUSEA AND VOMITING  . SYNTHROID 112 MCG tablet TAKE 1 TABLET BY MOUTH EVERY DAY BEFORE BREAKFAST  . vitamin C (ASCORBIC ACID) 500 MG tablet Take 1,000 mg by mouth 2 (two) times daily.   . vitamin E 1000 UNIT capsule Take 1,000 Units by mouth daily.  . [DISCONTINUED] metoprolol succinate (TOPROL-XL) 25 MG 24 hr tablet TAKE 1 TABLET BY MOUTH EVERY DAY     Allergies:   Prednisone, Atrovent hfa [ipratropium bromide hfa], Cheratussin ac  [guaifenesin-codeine], Codeine, Hydrocodone, Influenza vaccines, Motrin [ibuprofen], and Oxycodone-acetaminophen   Social History   Socioeconomic History  . Marital status: Married    Spouse name: [name]  . Number of children: 3  . Years of education: [employer]  . Highest education level: Not on file  Occupational History  . Occupation: DISABLED    Employer: UNEMPLOYED  Tobacco Use  . Smoking status: Never Smoker  . Smokeless tobacco: Never Used  Vaping Use  . Vaping Use: Never used  Substance and Sexual Activity  . Alcohol use: No    Alcohol/week: 0.0 standard drinks  . Drug use: No  . Sexual activity: Not on file  Other Topics Concern  . Not on file  Social History Narrative   Lives with husband [name]   Caffeine use: coffee (2 cups per day)   Mostly right-handed   Social Determinants of Health   Financial Resource Strain: Not on file  Food Insecurity: Not on file  Transportation Needs: Not on file  Physical Activity: Not on file  Stress: Not on file  Social Connections: Not on file     Family History: The patient's family history includes Arthritis in an other family member; Colon cancer in her brother and father; Diabetes in her maternal grandmother; HIV in her brother; Heart disease in her maternal grandmother; Hypertension in her maternal grandmother; Kidney cancer in her brother; Lung cancer in her brother; Other in her brother; Ovarian cancer in her mother; Prostate cancer in her father; Stroke in her maternal grandmother; Uterine cancer in her mother.  ROS:   Review of Systems  Constitution: Negative for decreased appetite, fever and weight gain.  HENT: Negative for congestion, ear discharge, hoarse voice and sore throat.   Eyes: Negative for discharge, redness, vision loss in right eye and visual halos.  Cardiovascular: Negative for chest pain, dyspnea on exertion, leg swelling, orthopnea and palpitations.  Respiratory: Negative for cough, hemoptysis,  shortness of breath and snoring.   Endocrine: Negative for heat intolerance and polyphagia.  Hematologic/Lymphatic: Negative for bleeding problem. Does not bruise/bleed easily.  Skin: Negative for flushing, nail changes, rash and suspicious lesions.  Musculoskeletal: Negative for arthritis, joint pain, muscle cramps, myalgias, neck pain and stiffness.  Gastrointestinal: Negative for abdominal pain, bowel incontinence, diarrhea and excessive appetite.  Genitourinary: Negative for decreased libido, genital sores and incomplete emptying.  Neurological: Negative for brief paralysis, focal weakness, headaches and loss of balance.  Psychiatric/Behavioral: Negative for altered mental status, depression and suicidal ideas.  Allergic/Immunologic: Negative for HIV exposure and persistent infections.    EKGs/Labs/Other Studies Reviewed:    The following studies were reviewed today:   EKG:  The ekg ordered today demonstrates sinus rhythm, heart rate 70 bpm with underlying right bundle branch block  Echocardiogram February 2021IMPRESSIONS    1. Left ventricular ejection  fraction, by visual estimation, is 55 to  60%. The left ventricle has normal function. There is no left ventricular  hypertrophy.  2. Left ventricular diastolic parameters are consistent with Grade I  diastolic dysfunction (impaired relaxation).  3. The left ventricle has no regional wall motion abnormalities.  4. Global right ventricle has normal systolic function.The right  ventricular size is normal. No increase in right ventricular wall  thickness.  5. Left atrial size was normal.  6. Right atrial size was normal.  7. The mitral valve is normal in structure. Mild mitral valve  regurgitation. No evidence of mitral stenosis.  8. The tricuspid valve is normal in structure. Tricuspid valve  regurgitation is mild.  9. The aortic valve is normal in structure. Aortic valve regurgitation is  mild to moderate. No evidence  of aortic valve sclerosis or stenosis.  10. The pulmonic valve was normal in structure. Pulmonic valve  regurgitation is not visualized.  11. There is mild dilatation of the ascending aorta measuring 38 mm.  12. Normal pulmonary artery systolic pressure.   FINDINGS  Left Ventricle: Left ventricular ejection fraction, by visual estimation,  is 55 to 60%. The left ventricle has normal function. The left ventricle  has no regional wall motion abnormalities. There is no left ventricular  hypertrophy. Left ventricular  diastolic parameters are consistent with Grade I diastolic dysfunction  (impaired relaxation). Normal left atrial pressure.   Right Ventricle: The right ventricular size is normal. No increase in  right ventricular wall thickness. Global RV systolic function is has  normal systolic function. The tricuspid regurgitant velocity is 1.83 m/s,  and with an assumed right atrial pressure  of 3 mmHg, the estimated right ventricular systolic pressure is normal at  16.3 mmHg.   Left Atrium: Left atrial size was normal in size.   Right Atrium: Right atrial size was normal in size   Pericardium: There is no evidence of pericardial effusion.   Mitral Valve: The mitral valve is normal in structure. Mild mitral valve  regurgitation. No evidence of mitral valve stenosis by observation.   Tricuspid Valve: The tricuspid valve is normal in structure. Tricuspid  valve regurgitation is mild.   Aortic Valve: The aortic valve is normal in structure. Aortic valve  regurgitation is mild to moderate. Aortic regurgitation PHT measures 477  msec. The aortic valve is structurally normal, with no evidence of  sclerosis or stenosis.   Pulmonic Valve: The pulmonic valve was normal in structure. Pulmonic valve  regurgitation is not visualized. Pulmonic regurgitation is not visualized.   Aorta: Aortic dilatation noted. There is mild dilatation of the ascending  aorta measuring 38 mm.   Venous:  The inferior vena cava is normal in size with greater than 50%  respiratory variability, suggesting right atrial pressure of 3 mmHg.   IAS/Shunts: No atrial level shunt detected by color flow Doppler. There is  no evidence of a patent foramen ovale. No ventricular septal defect is  seen or detected. There is no evidence of an atrial septal defect.   ZIO monitor October 2021 The patient wore the monitor for 12 days 21 hours starting  12/24/2019 . Indication: Dizziness  The minimum heart rate was 43 bpm, maximum heart rate was 171 bpm, and average heart rate was 68 bpm. Predominant underlying rhythm was Sinus Rhythm.   Rhythm. Bundle Branch Block/IVCD was present.   15 Supraventricular Tachycardia runs occurred, the run with the fastest interval lasting 6 beats with a max rate of 171bpm,  the longest lasting 10 beats with an avg rate of 103 bpm.   Premature atrial complexes  were rare (<1.0%).  Premature Ventricular complexes  were rare (<1.0%). No ventricular tachycardia, no pauses, No AV block and no atrial fibrillation present.  12 patient triggered events:  1 associated with Supraventricular tachycardia, 3 associated with premature atrial complex and the remaining associated with sinus rhythm. 2 diary events noted all associated with sinus rhythm.   Conclusion: This study is remarkable for supraventricular tachycardia which is likely atrial tachycardia with variable block.    Recent Labs: 03/18/2020: ALT 26; BUN 15; Creatinine, Ser 0.83; Hemoglobin 14.0; Platelets 258.0; Potassium 3.6; Sodium 139; TSH 1.89  Recent Lipid Panel    Component Value Date/Time   CHOL 196 03/18/2020 0823   TRIG 91.0 03/18/2020 0823   HDL 58.70 03/18/2020 0823   CHOLHDL 3 03/18/2020 0823   VLDL 18.2 03/18/2020 0823   LDLCALC 119 (H) 03/18/2020 0823   LDLCALC 136 (H) 11/30/2019 0937    Physical Exam:    VS:  BP 130/70 (BP Location: Left Arm, Patient Position: Sitting, Cuff Size: Normal)    Pulse 73   Ht 5\' 3"  (1.6 m)   Wt 135 lb (61.2 kg)   SpO2 98%   BMI 23.91 kg/m     Wt Readings from Last 3 Encounters:  05/24/20 135 lb (61.2 kg)  05/14/20 136 lb (61.7 kg)  03/05/20 131 lb 1.6 oz (59.5 kg)     GEN: Well nourished, well developed in no acute distress HEENT: Normal NECK: No JVD; No carotid bruits LYMPHATICS: No lymphadenopathy CARDIAC: S1S2 noted,RRR, no murmurs, rubs, gallops RESPIRATORY:  Clear to auscultation without rales, wheezing or rhonchi  ABDOMEN: Soft, non-tender, non-distended, +bowel sounds, no guarding. EXTREMITIES: No edema, No cyanosis, no clubbing MUSCULOSKELETAL:  No deformity  SKIN: Warm and dry NEUROLOGIC:  Alert and oriented x 3, non-focal PSYCHIATRIC:  Normal affect, good insight  ASSESSMENT:    1. Shortness of breath   2. Palpitations   3. Hypertension, unspecified type   4. PAT (paroxysmal atrial tachycardia) ([place])   5. Aortic valve insufficiency, etiology of cardiac valve disease unspecified    PLAN:       She is having more palpitations she does have a history of paroxysmal atrial tachycardia which was seen on ZIO monitor in 2021.  She has been on metoprolol though she recently did started taking this but she thinks that the medicine is not helping with the palpitations.  What I like to do is stop the metoprolol started patient on Cardizem 120 mg at nighttime.  She does not have any allergies to this medication.  With her progressive shortness of breath and known aortic regurgitation I like to repeat her echocardiogram to make sure that her valve abnormality is not worsening.  I reviewed her lipid profile with her which was done in December 2021 which showed elevated LDL at 119.  Total cholesterol had improved compared to prior.  The patient is in agreement with the above plan. The patient left the office in stable condition.  The patient will follow up in 4 weeks due to medication change.   Medication Adjustments/Labs and Tests  Ordered: Current medicines are reviewed at length with the patient today.  Concerns regarding medicines are outlined above.  Orders Placed This Encounter  Procedures  . Basic metabolic panel  . Magnesium  . TSH  . EKG 12-Lead  . ECHOCARDIOGRAM COMPLETE   Meds ordered this encounter  Medications  .  diltiazem (CARDIZEM LA) 120 MG 24 hr tablet    Sig: Take 1 tablet (120 mg total) by mouth daily.    Dispense:  90 tablet    Refill:  3    Patient Instructions  Medication Instructions:  Your physician has recommended you make the following change in your medication: START: Cardizem 120 mg daily. Take one tablet by mouth daily.  *If you need a refill on your cardiac medications before your next appointment, please call your pharmacy*   Lab Work: Your physician recommends that you return for lab work: [place], TSH  If you have labs (blood work) drawn today and your tests are completely normal, you will receive your results only by: Marland Kitchen MyChart Message (if you have MyChart) OR . A paper copy in the mail If you have any lab test that is abnormal or we need to change your treatment, we will call you to review the results.   Testing/Procedures: Your physician has requested that you have an echocardiogram. Echocardiography is a painless test that uses sound waves to create images of your heart. It provides your doctor with information about the size and shape of your heart and how well your heart's chambers and valves are working. This procedure takes approximately one hour. There are no restrictions for this procedure.     Follow-Up: At [facility], you and your health needs are our priority.  As part of our continuing mission to provide you with exceptional heart care, we have created designated Provider Care Teams.  These Care Teams include your primary Cardiologist (physician) and Advanced Practice Providers (APPs -  Physician Assistants and Nurse Practitioners) who all work  together to provide you with the care you need, when you need it.  We recommend signing up for the patient portal called "MyChart".  Sign up information is provided on this After Visit Summary.  MyChart is used to connect with patients for Virtual Visits (Telemedicine).  Patients are able to view lab/test results, encounter notes, upcoming appointments, etc.  Non-urgent messages can be sent to your provider as well.   To learn more about what you can do with MyChart, go to [email or web address].    Your next appointment:   4 week(s)  The format for your next appointment:   In Person  Provider:   Berniece Salines, DO   Other Instructions      Adopting a Healthy Lifestyle.  Know what a healthy weight is for you (roughly BMI <25) and aim to maintain this   Aim for 7+ servings of fruits and vegetables daily   65-80+ fluid ounces of water or unsweet tea for healthy kidneys   Limit to max 1 drink of alcohol per day; avoid smoking/tobacco   Limit animal fats in diet for cholesterol and heart health - choose grass fed whenever available   Avoid highly processed foods, and foods high in saturated/trans fats   Aim for low stress - take time to unwind and care for your mental health   Aim for 150 min of moderate intensity exercise weekly for heart health, and weights twice weekly for bone health   Aim for 7-9 hours of sleep daily   When it comes to diets, agreement about the perfect plan isnt easy to find, even among the experts. Experts at the [employer] developed an idea known as the Healthy Eating Plate. Just imagine a plate divided into logical, healthy portions.   The emphasis is on diet quality:  Load up on vegetables and fruits - one-half of your plate: Aim for color and variety, and remember that potatoes dont count.   Go for whole grains - one-quarter of your plate: Whole wheat, barley, wheat berries, quinoa, oats, brown rice, and foods made with  them. If you want pasta, go with whole wheat pasta.   Protein power - one-quarter of your plate: Fish, chicken, beans, and nuts are all healthy, versatile protein sources. Limit red meat.   The diet, however, does go beyond the plate, offering a few other suggestions.   Use healthy plant oils, such as olive, canola, soy, corn, sunflower and peanut. Check the labels, and avoid partially hydrogenated oil, which have unhealthy trans fats.   If youre thirsty, drink water. Coffee and tea are good in moderation, but skip sugary drinks and limit milk and dairy products to one or two daily servings.   The type of carbohydrate in the diet is more important than the amount. Some sources of carbohydrates, such as vegetables, fruits, whole grains, and beans-are healthier than others.   Finally, stay active  Signed, Berniece Salines, DO  05/24/2020 2:26 PM    [facility]

## 2020-05-24 NOTE — Patient Instructions (Signed)
Medication Instructions:  Your physician has recommended you make the following change in your medication: START: Cardizem 120 mg daily. Take one tablet by mouth daily.  *If you need a refill on your cardiac medications before your next appointment, please call your pharmacy*   Lab Work: Your physician recommends that you return for lab work: [place], TSH  If you have labs (blood work) drawn today and your tests are completely normal, you will receive your results only by: Marland Kitchen MyChart Message (if you have MyChart) OR . A paper copy in the mail If you have any lab test that is abnormal or we need to change your treatment, we will call you to review the results.   Testing/Procedures: Your physician has requested that you have an echocardiogram. Echocardiography is a painless test that uses sound waves to create images of your heart. It provides your doctor with information about the size and shape of your heart and how well your heart's chambers and valves are working. This procedure takes approximately one hour. There are no restrictions for this procedure.     Follow-Up: At [facility], you and your health needs are our priority.  As part of our continuing mission to provide you with exceptional heart care, we have created designated Provider Care Teams.  These Care Teams include your primary Cardiologist (physician) and Advanced Practice Providers (APPs -  Physician Assistants and Nurse Practitioners) who all work together to provide you with the care you need, when you need it.  We recommend signing up for the patient portal called "MyChart".  Sign up information is provided on this After Visit Summary.  MyChart is used to connect with patients for Virtual Visits (Telemedicine).  Patients are able to view lab/test results, encounter notes, upcoming appointments, etc.  Non-urgent messages can be sent to your provider as well.   To learn more about what you can do with MyChart, go  to [email or web address].    Your next appointment:   4 week(s)  The format for your next appointment:   In Person  Provider:   Berniece Salines, DO   Other Instructions

## 2020-05-25 LAB — TSH: TSH: 1.46 u[IU]/mL (ref 0.450–4.500)

## 2020-05-25 LAB — BASIC METABOLIC PANEL
BUN/Creatinine Ratio: 18 (ref 12–28)
BUN: 14 mg/dL (ref 8–27)
CO2: 26 mmol/L (ref 20–29)
Calcium: 9.4 mg/dL (ref 8.7–10.3)
Chloride: 98 mmol/L (ref 96–106)
Creatinine, Ser: 0.79 mg/dL (ref 0.57–1.00)
GFR calc Af Amer: 85 mL/min/{1.73_m2} (ref >59)
GFR calc non Af Amer: 73 mL/min/{1.73_m2} (ref >59)
Glucose: 49 mg/dL — ABNORMAL LOW (ref 65–99)
Potassium: 3.3 mmol/L — ABNORMAL LOW (ref 3.5–5.2)
Sodium: 140 mmol/L (ref 134–144)

## 2020-05-25 LAB — MAGNESIUM: Magnesium: 2.2 mg/dL (ref 1.6–2.3)

## 2020-05-29 ENCOUNTER — Telehealth: Payer: Self-pay

## 2020-05-29 DIAGNOSIS — M546 Pain in thoracic spine: Secondary | ICD-10-CM | POA: Diagnosis not present

## 2020-05-29 NOTE — Telephone Encounter (Signed)
Spoke with patient about the Cardizem she was prescribed. It was unclear to her that she was being started in a calcium channel blocker. Patient verbalizes understanding. No further questions or concerns at this time.

## 2020-06-03 ENCOUNTER — Telehealth: Payer: Self-pay

## 2020-06-03 NOTE — Telephone Encounter (Signed)
Patient is inquiring whether Diltiazem will interact with Metoprolil that she is taking?

## 2020-06-04 DIAGNOSIS — M546 Pain in thoracic spine: Secondary | ICD-10-CM | POA: Diagnosis not present

## 2020-06-04 NOTE — Telephone Encounter (Signed)
Patient advised to speak with Pharmacy.

## 2020-06-05 ENCOUNTER — Other Ambulatory Visit: Payer: Self-pay

## 2020-06-05 ENCOUNTER — Telehealth (INDEPENDENT_AMBULATORY_CARE_PROVIDER_SITE_OTHER): Payer: Medicare Other | Admitting: Family Medicine

## 2020-06-05 ENCOUNTER — Encounter: Payer: Self-pay | Admitting: Family Medicine

## 2020-06-05 DIAGNOSIS — R110 Nausea: Secondary | ICD-10-CM | POA: Diagnosis not present

## 2020-06-05 DIAGNOSIS — J0141 Acute recurrent pansinusitis: Secondary | ICD-10-CM

## 2020-06-05 MED ORDER — AMOXICILLIN-POT CLAVULANATE 875-125 MG PO TABS
1.0000 | ORAL_TABLET | Freq: Two times a day (BID) | ORAL | 0 refills | Status: DC
Start: 2020-06-05 — End: 2020-07-17

## 2020-06-05 MED ORDER — FLUTICASONE PROPIONATE 50 MCG/ACT NA SUSP: 2.0000 | Freq: Every day | NASAL | 6 refills | Status: DC

## 2020-06-05 MED ORDER — ONDANSETRON 4 MG PO TBDP
ORAL_TABLET | ORAL | 2 refills | Status: DC
Start: 2020-06-05 — End: 2020-11-26

## 2020-06-05 NOTE — Progress Notes (Signed)
Virtual Visit via Video Note  I connected with Alexandria [name] on 06/05/20 at  9:00 AM EST by a video enabled telemedicine application and verified that I am speaking with the correct person using two identifiers.  Location/participants in ov  Patient: home alone  Provider: home   I discussed the limitations of evaluation and management by telemedicine and the availability of in person appointments. The patient expressed understanding and agreed to proceed.  History of Present Illness:  Pt is home c/o 4 weeks headaches, sinus congestion -- she is taking vita c , zinc  Pt has been tested mult times for covid and has been neg everytime Observations/Objective: There were no vitals filed for this visit. Pt is in nad   Assessment and Plan: 1. Acute recurrent pansinusitis abx and flonase per orders  Pt can come into office if no improvement  - amoxicillin-clavulanate (AUGMENTIN) 875-125 MG tablet; Take 1 tablet by mouth 2 (two) times daily.  Dispense: 20 tablet; Refill: 0 - fluticasone (FLONASE) 50 MCG/ACT nasal spray; Place 2 sprays into both nostrils daily.  Dispense: 16 g; Refill: 6  2. Nausea Refill zofran  - ondansetron (ZOFRAN-ODT) 4 MG disintegrating tablet; TAKE 1 TABLET BY MOUTH EVERY 8 HOURS AS NEEDED FOR NAUSEA AND VOMITING  Dispense: 60 tablet; Refill: 2  Follow Up Instructions:    I discussed the assessment and treatment plan with the patient. The patient was provided an opportunity to ask questions and all were answered. The patient agreed with the plan and demonstrated an understanding of the instructions.   The patient was advised to call back or seek an in-person evaluation if the symptoms worsen or if the condition fails to improve as anticipated.    [name], DO

## 2020-06-09 ENCOUNTER — Other Ambulatory Visit: Payer: Self-pay | Admitting: Family Medicine

## 2020-06-10 ENCOUNTER — Other Ambulatory Visit: Payer: Self-pay | Admitting: Family Medicine

## 2020-06-11 DIAGNOSIS — M546 Pain in thoracic spine: Secondary | ICD-10-CM | POA: Diagnosis not present

## 2020-06-14 DIAGNOSIS — M546 Pain in thoracic spine: Secondary | ICD-10-CM | POA: Diagnosis not present

## 2020-06-17 DIAGNOSIS — M546 Pain in thoracic spine: Secondary | ICD-10-CM | POA: Diagnosis not present

## 2020-06-18 DIAGNOSIS — M546 Pain in thoracic spine: Secondary | ICD-10-CM | POA: Diagnosis not present

## 2020-06-19 ENCOUNTER — Other Ambulatory Visit: Payer: Self-pay

## 2020-06-19 ENCOUNTER — Ambulatory Visit (HOSPITAL_BASED_OUTPATIENT_CLINIC_OR_DEPARTMENT_OTHER)
Admission: RE | Admit: 2020-06-19 | Discharge: 2020-06-19 | Disposition: A | Discharge status: Home / Self Care | Payer: Medicare Other | Source: Ambulatory Visit | Attending: Cardiology | Admitting: Cardiology

## 2020-06-19 DIAGNOSIS — I471 Supraventricular tachycardia: Secondary | ICD-10-CM

## 2020-06-19 DIAGNOSIS — I10 Essential (primary) hypertension: Secondary | ICD-10-CM

## 2020-06-19 DIAGNOSIS — R0602 Shortness of breath: Secondary | ICD-10-CM | POA: Diagnosis not present

## 2020-06-19 DIAGNOSIS — I351 Nonrheumatic aortic (valve) insufficiency: Secondary | ICD-10-CM

## 2020-06-19 LAB — ECHOCARDIOGRAM COMPLETE
Area-P 1/2: 6.27 cm2
P 1/2 time: 506 msec
S' Lateral: 2.47 cm

## 2020-06-20 ENCOUNTER — Telehealth: Payer: Self-pay

## 2020-06-20 DIAGNOSIS — I77810 Thoracic aortic ectasia: Secondary | ICD-10-CM

## 2020-06-20 NOTE — Telephone Encounter (Signed)
-----   Message from Berniece Salines, DO sent at 06/20/2020  3:26 PM EDT ----- EF is normal, still with grade 1 diastolic dysfunction.  Your mitral regurgitation is still mild.  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  I like to get a CT of the chest to confirm the ascending aorta dilatation.

## 2020-06-20 NOTE — Telephone Encounter (Signed)
Spoke with patient regarding results and recommendation.  Patient verbalizes understanding and is agreeable to plan of care. Advised patient to call back with any issues or concerns.  

## 2020-06-24 NOTE — Addendum Note (Signed)
Addended by: Truddie Hidden on: 06/24/2020 09:02 AM   Modules accepted: Orders

## 2020-06-26 DIAGNOSIS — J84112 Idiopathic pulmonary fibrosis: Secondary | ICD-10-CM | POA: Diagnosis not present

## 2020-06-26 DIAGNOSIS — K219 Gastro-esophageal reflux disease without esophagitis: Secondary | ICD-10-CM | POA: Diagnosis not present

## 2020-06-26 DIAGNOSIS — Z8673 Personal history of transient ischemic attack (TIA), and cerebral infarction without residual deficits: Secondary | ICD-10-CM | POA: Diagnosis not present

## 2020-06-26 DIAGNOSIS — I2510 Atherosclerotic heart disease of native coronary artery without angina pectoris: Secondary | ICD-10-CM | POA: Diagnosis not present

## 2020-06-26 DIAGNOSIS — J849 Interstitial pulmonary disease, unspecified: Secondary | ICD-10-CM | POA: Diagnosis not present

## 2020-06-26 DIAGNOSIS — Z79899 Other long term (current) drug therapy: Secondary | ICD-10-CM | POA: Diagnosis not present

## 2020-06-26 DIAGNOSIS — K580 Irritable bowel syndrome with diarrhea: Secondary | ICD-10-CM | POA: Diagnosis not present

## 2020-06-26 DIAGNOSIS — Z87891 Personal history of nicotine dependence: Secondary | ICD-10-CM | POA: Diagnosis not present

## 2020-06-26 DIAGNOSIS — R0602 Shortness of breath: Secondary | ICD-10-CM | POA: Diagnosis not present

## 2020-06-26 DIAGNOSIS — I252 Old myocardial infarction: Secondary | ICD-10-CM | POA: Diagnosis not present

## 2020-06-27 ENCOUNTER — Telehealth: Payer: Self-pay | Admitting: Internal Medicine

## 2020-06-27 ENCOUNTER — Ambulatory Visit: Payer: Medicare Other | Admitting: Cardiology

## 2020-06-27 NOTE — Telephone Encounter (Signed)
Called [facility] patient isn't scheduled for CT  Until 07/04/20 her last one was in 2020. Had to leave a message Will route to Raquel Sarna to follow up on MR's patient.

## 2020-06-28 DIAGNOSIS — M546 Pain in thoracic spine: Secondary | ICD-10-CM | POA: Diagnosis not present

## 2020-06-28 NOTE — Telephone Encounter (Signed)
Message originated in triage. Due to this and so the message can be followed up on, routing back to triage.

## 2020-06-28 NOTE — Telephone Encounter (Signed)
Called and spoke with Carollee Sires from [facility] letting her know that pt is currently scheduled to have a HRCT 3/31. Stated to her that prior HRCT was done 03/07/19. Yoselyn asked to have those results faxed to her and stated to her that we would then fax results of upcoming HRCT to her. Verified fax number and have sent the results of pt's 2020 HRCT in Yoselyn's attn.  Will route this to myself to follow up on for after pt has her upcoming HRCT.

## 2020-07-01 ENCOUNTER — Other Ambulatory Visit: Payer: Self-pay

## 2020-07-01 DIAGNOSIS — I10 Essential (primary) hypertension: Secondary | ICD-10-CM

## 2020-07-01 DIAGNOSIS — R0602 Shortness of breath: Secondary | ICD-10-CM

## 2020-07-01 DIAGNOSIS — M546 Pain in thoracic spine: Secondary | ICD-10-CM | POA: Diagnosis not present

## 2020-07-01 DIAGNOSIS — I2510 Atherosclerotic heart disease of native coronary artery without angina pectoris: Secondary | ICD-10-CM

## 2020-07-01 NOTE — Progress Notes (Signed)
Orders for CT

## 2020-07-04 ENCOUNTER — Ambulatory Visit (HOSPITAL_BASED_OUTPATIENT_CLINIC_OR_DEPARTMENT_OTHER): Admission: RE | Admit: 2020-07-04 | Payer: Medicare Other | Source: Ambulatory Visit

## 2020-07-04 DIAGNOSIS — I200 Unstable angina: Secondary | ICD-10-CM | POA: Diagnosis not present

## 2020-07-04 DIAGNOSIS — R931 Abnormal findings on diagnostic imaging of heart and coronary circulation: Secondary | ICD-10-CM | POA: Diagnosis not present

## 2020-07-05 ENCOUNTER — Ambulatory Visit (HOSPITAL_BASED_OUTPATIENT_CLINIC_OR_DEPARTMENT_OTHER)
Admission: RE | Admit: 2020-07-05 | Discharge: 2020-07-05 | Disposition: A | Discharge status: Home / Self Care | Payer: Medicare Other | Source: Ambulatory Visit | Attending: Cardiology | Admitting: Cardiology

## 2020-07-05 ENCOUNTER — Other Ambulatory Visit: Payer: Self-pay

## 2020-07-05 DIAGNOSIS — I714 Abdominal aortic aneurysm, without rupture, unspecified: Secondary | ICD-10-CM

## 2020-07-05 DIAGNOSIS — M546 Pain in thoracic spine: Secondary | ICD-10-CM | POA: Diagnosis not present

## 2020-07-05 LAB — BASIC METABOLIC PANEL
BUN/Creatinine Ratio: 11 — ABNORMAL LOW (ref 12–28)
BUN: 9 mg/dL (ref 8–27)
CO2: 28 mmol/L (ref 20–29)
Calcium: 9.5 mg/dL (ref 8.7–10.3)
Chloride: 101 mmol/L (ref 96–106)
Creatinine, Ser: 0.79 mg/dL (ref 0.57–1.00)
Glucose: 108 mg/dL — ABNORMAL HIGH (ref 65–99)
Potassium: 3.8 mmol/L (ref 3.5–5.2)
Sodium: 143 mmol/L (ref 134–144)
eGFR: 78 mL/min/{1.73_m2} (ref >59)

## 2020-07-05 NOTE — Telephone Encounter (Signed)
Checked pt's chart and saw that pt did not have HRCT performed yesterday 3/31 and also seeing that no order is now showing for this. When go to pt's appt desk, I also see nothing in there about the HRCT being cancelled.  Called and spoke with pt to see if she had HRCT performed yesterday 3/31 and she stated that she did not as she knew nothing about it. It seems like the scan was cancelled and all was removed about this order being placed.  Pt said that she has not been happy about how things have been happening at office since she has been under Dr. Golden Pop care and is now going to [facility]. Pt said that she is not sure if she will be coming back to office for appts. Stated to pt to call office if there is anything she needed and she verbalized understanding. Nothing further needed.

## 2020-07-06 ENCOUNTER — Ambulatory Visit (HOSPITAL_BASED_OUTPATIENT_CLINIC_OR_DEPARTMENT_OTHER): Payer: Medicare Other

## 2020-07-09 ENCOUNTER — Other Ambulatory Visit: Payer: Self-pay

## 2020-07-09 ENCOUNTER — Ambulatory Visit (HOSPITAL_BASED_OUTPATIENT_CLINIC_OR_DEPARTMENT_OTHER)
Admission: RE | Admit: 2020-07-09 | Discharge: 2020-07-09 | Disposition: A | Discharge status: Home / Self Care | Payer: Medicare Other | Source: Ambulatory Visit | Attending: Cardiology | Admitting: Cardiology

## 2020-07-09 ENCOUNTER — Encounter (HOSPITAL_BASED_OUTPATIENT_CLINIC_OR_DEPARTMENT_OTHER): Payer: Self-pay

## 2020-07-09 DIAGNOSIS — J189 Pneumonia, unspecified organism: Secondary | ICD-10-CM | POA: Diagnosis not present

## 2020-07-09 DIAGNOSIS — I714 Abdominal aortic aneurysm, without rupture: Secondary | ICD-10-CM | POA: Diagnosis not present

## 2020-07-09 DIAGNOSIS — R911 Solitary pulmonary nodule: Secondary | ICD-10-CM | POA: Diagnosis not present

## 2020-07-09 MED ORDER — IOHEXOL 350 MG/ML SOLN
100.0000 mL | Freq: Once | INTRAVENOUS | Status: AC | PRN
  Administered 2020-07-09: 100 mL via INTRAVENOUS

## 2020-07-11 ENCOUNTER — Telehealth: Payer: Self-pay

## 2020-07-11 NOTE — Telephone Encounter (Signed)
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  We will follow this annually.  The study also show stable interstitial lung disease.

## 2020-07-11 NOTE — Telephone Encounter (Signed)
Spoke with patient regarding results and recommendation.  Patient verbalizes understanding and is agreeable to plan of care. Advised patient to call back with any issues or concerns.  

## 2020-07-15 ENCOUNTER — Telehealth (INDEPENDENT_AMBULATORY_CARE_PROVIDER_SITE_OTHER): Payer: Medicare Other | Admitting: Family Medicine

## 2020-07-15 ENCOUNTER — Encounter: Payer: Self-pay | Admitting: Family Medicine

## 2020-07-15 ENCOUNTER — Other Ambulatory Visit: Payer: Self-pay

## 2020-07-15 DIAGNOSIS — J0141 Acute recurrent pansinusitis: Secondary | ICD-10-CM

## 2020-07-15 DIAGNOSIS — M546 Pain in thoracic spine: Secondary | ICD-10-CM | POA: Diagnosis not present

## 2020-07-15 MED ORDER — AMOXICILLIN-POT CLAVULANATE 875-125 MG PO TABS: 1.0000 | ORAL_TABLET | Freq: Two times a day (BID) | ORAL | 0 refills | Status: DC

## 2020-07-15 MED ORDER — FLUTICASONE PROPIONATE 50 MCG/ACT NA SUSP: 2.0000 | Freq: Every day | NASAL | 6 refills | Status: DC

## 2020-07-15 NOTE — Progress Notes (Signed)
MyChart Video Visit    Virtual Visit via Video Note   This visit type was conducted due to national recommendations for restrictions regarding the COVID-19 Pandemic (e.g. social distancing) in an effort to limit this patient's exposure and mitigate transmission in our community. This patient is at least at moderate risk for complications without adequate follow up. This format is felt to be most appropriate for this patient at this time. Physical exam was limited by quality of the video and audio technology used for the visit. Kristine Garbe Creft  was able to get the patient set up on a video visit.  Patient location: Home Patient and provider in visit Provider location: Office  I discussed the limitations of evaluation and management by telemedicine and the availability of in person appointments. The patient expressed understanding and agreed to proceed.  Visit Date: 07/15/2020  Today's healthcare provider: [name], DO     Subjective:    Patient ID: Alexandria [name], female    DOB: 1945-12-18, 75 y.o.   MRN: [id number]  Chief Complaint  Patient presents with  . Sinus Problem    Pt c/o sinus congestion and facial pressure under the eyes x 2 weeks. OTC nasal spray has not helped.    HPI Patient is in today for sinus congestion /sinus headache x 2 weeks --- she is still using saline spray but ran out of flonase and would like refill   Had to switch to tele visit due to pt camera not working ---- was never able to connect via video  She did get completely better the end of last mont but than her symptoms came back  No fever  No cough  Past Medical History:  Diagnosis Date  . Arthritis   . Asthmatic bronchitis with acute exacerbation 02/06/2015  . Bronchial pneumonia   . Colon polyp   . Diverticulitis   . DVT (deep venous thrombosis) ([place])    lower extremity  . H/O cardiac catheterization 2004   Normal coronary arteries  . Heart murmur   . History of stress  test 05/21/2011  . Hx of echocardiogram 01/27/2010   Normal Ef 55% the transmitral spectral doppler flow pattern is normal for age. the left ventricular wall motion is normal  . IPF (idiopathic pulmonary fibrosis) ([place]) 01/2018  . Migraines   . Pancreatitis   . Thyroid disease   . TIA (transient ischemic attack)    8 years ago    Past Surgical History:  Procedure Laterality Date  . BREAST BIOPSY     [facility]  . CARDIAC CATHETERIZATION     10/2013  . CATARACT EXTRACTION Right 03/22/2018  . LEFT HEART CATHETERIZATION WITH CORONARY ANGIOGRAM N/A 10/25/2013   Procedure: LEFT HEART CATHETERIZATION WITH CORONARY ANGIOGRAM;  Surgeon: Troy Sine, MD;  Location: [facility] C[facility];  Service: Cardiovascular;  Laterality: N/A;  . NECK SURGERY     x's 2  . TOTAL KNEE ARTHROPLASTY     Bilateral x's 2  . VAGINAL HYSTERECTOMY  10/17/1998   [place] BRONCHOSCOPY Bilateral 01/24/2018   Procedure: VIDEO BRONCHOSCOPY WITH FLUORO;  Surgeon: [name], MD;  Location: [place];  Service: Cardiopulmonary;  Laterality: Bilateral;    Family History  Problem Relation Age of Onset  . Arthritis Other   . Colon cancer Father   . Prostate cancer Father   . HIV Brother        43  . Kidney cancer Brother   .  Colon cancer Brother   . Lung cancer Brother   . Other Brother        Mouth Cancer  . Ovarian cancer Mother   . Uterine cancer Mother   . Heart disease Maternal Grandmother   . Stroke Maternal Grandmother   . Hypertension Maternal Grandmother   . Diabetes Maternal Grandmother     Social History   Socioeconomic History  . Marital status: Married    Spouse name: [name]  . Number of children: 3  . Years of education: [employer]  . Highest education level: Not on file  Occupational History  . Occupation: DISABLED    Employer: UNEMPLOYED  Tobacco Use  . Smoking status: Never Smoker  . Smokeless tobacco: Never Used  Vaping Use  . Vaping Use: Never used  Substance and  Sexual Activity  . Alcohol use: No    Alcohol/week: 0.0 standard drinks  . Drug use: No  . Sexual activity: Not on file  Other Topics Concern  . Not on file  Social History Narrative   Lives with husband [name]   Caffeine use: coffee (2 cups per day)   Mostly right-handed   Social Determinants of Health   Financial Resource Strain: Not on file  Food Insecurity: Not on file  Transportation Needs: Not on file  Physical Activity: Not on file  Stress: Not on file  Social Connections: Not on file  Intimate Partner Violence: Not on file    Outpatient Medications Prior to Visit  Medication Sig Dispense Refill  . Cholecalciferol (VITAMIN D3) 125 MCG (5000 UT) CAPS Take 5,000 Units by mouth daily.    . clopidogrel (PLAVIX) 75 MG tablet Take 1 tablet (75 mg total) by mouth daily. 90 tablet 3  . diltiazem (CARDIZEM LA) 120 MG 24 hr tablet Take 1 tablet (120 mg total) by mouth daily. 90 tablet 3  . fluticasone (FLONASE) 50 MCG/ACT nasal spray Place 2 sprays into both nostrils daily. 16 g 6  . furosemide (LASIX) 40 MG tablet Take 1 tablet (40 mg total) by mouth 2 (two) times daily. 180 tablet 1  . KLOR-CON M20 20 MEQ tablet TAKE 1 TABLET (20 MEQ TOTAL) BY MOUTH 2 (TWO) TIMES DAILY. WILL TAKE 3 A DAILY ONLY AS NEEDED. 270 tablet 1  . Multiple Vitamin (MULTIVITAMIN) tablet Take 1 tablet by mouth every other day. Gluten free/Vegetarian Multivitamin    . Nintedanib (OFEV) 100 MG CAPS Take by mouth.    . ondansetron (ZOFRAN-ODT) 4 MG disintegrating tablet TAKE 1 TABLET BY MOUTH EVERY 8 HOURS AS NEEDED FOR NAUSEA AND VOMITING 60 tablet 2  . SYNTHROID 112 MCG tablet TAKE 1 TABLET BY MOUTH EVERY DAY BEFORE BREAKFAST 90 tablet 1  . vitamin C (ASCORBIC ACID) 500 MG tablet Take 1,000 mg by mouth 2 (two) times daily.     . vitamin E 1000 UNIT capsule Take 1,000 Units by mouth daily.    Marland Kitchen amoxicillin-clavulanate (AUGMENTIN) 875-125 MG tablet Take 1 tablet by mouth 2 (two) times daily. (Patient not taking:  Reported on 07/15/2020) 20 tablet 0  . OFEV 150 MG CAPS TAKE 1 CAPSULE BY MOUTH TWICE DAILY 12 HOURS APART WITH FOOD. CALL [phone] FOR REFILLS. (Patient not taking: Reported on 07/15/2020) 60 capsule 4   No facility-administered medications prior to visit.    Allergies  Allergen Reactions  . Prednisone Other (See Comments)    Changed personality   . Atrovent Hfa [Ipratropium Bromide Hfa] Other (See Comments)    Pt could  not sleep  . Cheratussin Ac [Guaifenesin-Codeine]   . Codeine Other (See Comments)    Pt takes promethazine with codeine at home  . Hydrocodone Itching  . Influenza Vaccines Other (See Comments)    Pt reports heart attack after last flu shot  . Motrin [Ibuprofen] Other (See Comments)    "Gives false reading in blood"  . Oxycodone-Acetaminophen Itching    Review of Systems  Constitutional: Negative for chills and fever.  HENT: Positive for congestion and sinus pain. Negative for ear pain and sore throat.   Eyes: Negative for pain.  Respiratory: Positive for cough. Negative for sputum production and shortness of breath.   Cardiovascular: Negative for chest pain.  Gastrointestinal: Negative for abdominal pain, blood in stool, constipation, diarrhea, nausea and vomiting.  Musculoskeletal: Negative for back pain and neck pain.  Neurological: Negative for dizziness.       Objective:    Physical Exam Constitutional:      Appearance: Normal appearance.  Pulmonary:     Effort: Pulmonary effort is normal.  Musculoskeletal:        General: Normal range of motion.  Neurological:     Mental Status: She is alert.  Psychiatric:        Behavior: Behavior normal.        Thought Content: Thought content normal.     There were no vitals taken for this visit. Wt Readings from Last 3 Encounters:  05/24/20 135 lb (61.2 kg)  05/14/20 136 lb (61.7 kg)  03/05/20 131 lb 1.6 oz (59.5 kg)    Diabetic Foot Exam - Simple   No data filed    Lab Results  Component  Value Date   WBC 7.1 03/18/2020   HGB 14.0 03/18/2020   HCT 40.9 03/18/2020   PLT 258.0 03/18/2020   GLUCOSE 108 (H) 07/04/2020   CHOL 196 03/18/2020   TRIG 91.0 03/18/2020   HDL 58.70 03/18/2020   LDLCALC 119 (H) 03/18/2020   ALT 26 03/18/2020   AST 25 03/18/2020   NA 143 07/04/2020   K 3.8 07/04/2020   CL 101 07/04/2020   CREATININE 0.79 07/04/2020   BUN 9 07/04/2020   CO2 28 07/04/2020   TSH 1.460 05/24/2020   INR 1.1 (H) 05/07/2015   HGBA1C 5.4 03/18/2020    Lab Results  Component Value Date   TSH 1.460 05/24/2020   Lab Results  Component Value Date   WBC 7.1 03/18/2020   HGB 14.0 03/18/2020   HCT 40.9 03/18/2020   MCV 98.3 03/18/2020   PLT 258.0 03/18/2020   Lab Results  Component Value Date   NA 143 07/04/2020   K 3.8 07/04/2020   CO2 28 07/04/2020   GLUCOSE 108 (H) 07/04/2020   BUN 9 07/04/2020   CREATININE 0.79 07/04/2020   BILITOT 0.7 03/18/2020   ALKPHOS 60 03/18/2020   AST 25 03/18/2020   ALT 26 03/18/2020   PROT 6.3 03/18/2020   ALBUMIN 3.6 03/18/2020   CALCIUM 9.5 07/04/2020   ANIONGAP 12 11/13/2019   GFR 69.04 03/18/2020   Lab Results  Component Value Date   CHOL 196 03/18/2020   Lab Results  Component Value Date   HDL 58.70 03/18/2020   Lab Results  Component Value Date   LDLCALC 119 (H) 03/18/2020   Lab Results  Component Value Date   TRIG 91.0 03/18/2020   Lab Results  Component Value Date   CHOLHDL 3 03/18/2020   Lab Results  Component Value Date   HGBA1C  5.4 03/18/2020       Assessment & Plan:   Problem List Items Addressed This Visit      Unprioritized   Pansinusitis - Primary   Relevant Medications   amoxicillin-clavulanate (AUGMENTIN) 875-125 MG tablet   fluticasone (FLONASE) 50 MCG/ACT nasal spray    ok to use mucinex prn  Call or rto prn     Meds ordered this encounter  Medications  . amoxicillin-clavulanate (AUGMENTIN) 875-125 MG tablet    Sig: Take 1 tablet by mouth 2 (two) times daily.     Dispense:  20 tablet    Refill:  0  . fluticasone (FLONASE) 50 MCG/ACT nasal spray    Sig: Place 2 sprays into both nostrils daily.    Dispense:  16 g    Refill:  6    I discussed the assessment and treatment plan with the patient. The patient was provided an opportunity to ask questions and all were answered. The patient agreed with the plan and demonstrated an understanding of the instructions.   The patient was advised to call back or seek an in-person evaluation if the symptoms worsen or if the condition fails to improve as anticipated.    [name], DO [place] [facility] [phone] (phone) [phone] (fax)  [place]

## 2020-07-17 ENCOUNTER — Encounter: Payer: Self-pay | Admitting: Cardiology

## 2020-07-17 ENCOUNTER — Ambulatory Visit (INDEPENDENT_AMBULATORY_CARE_PROVIDER_SITE_OTHER): Payer: Medicare Other | Admitting: Cardiology

## 2020-07-17 ENCOUNTER — Other Ambulatory Visit: Payer: Self-pay

## 2020-07-17 VITALS — BP 124/78 | HR 86 | Ht 64.0 in | Wt 134.0 lb

## 2020-07-17 DIAGNOSIS — I471 Supraventricular tachycardia: Secondary | ICD-10-CM

## 2020-07-17 NOTE — Patient Instructions (Signed)

## 2020-07-17 NOTE — Progress Notes (Signed)
Cardiology Office Note:    Date:  07/17/2020   ID:  Alexandria [name], DOB [birth year], MRN [id number]  PCP:  Carollee Herter, Alferd Apa, DO  Cardiologist:  Berniece Salines, DO  Electrophysiologist:  None   Referring MD: Carollee Herter, Alferd Apa, *   I am doing well   History of Present Illness:    Alexandria [name] is a 75 y.o. female with a hx of TIA, DVT, paroxysmal atrial tachycardia, chronic venous insufficiency, varicose veins, hypertension is here today for follow-up visit.  I saw the patient March 05, 2020 at that time she had not been taking the beta-blocker but experiencing palpitations.  We also discussed her varicose veins and bilateral leg edema and I recommended compression stocking.  I saw the patient on 05/24/2020  Past Medical History:  Diagnosis Date  . Arthritis   . Asthmatic bronchitis with acute exacerbation 02/06/2015  . Bronchial pneumonia   . Colon polyp   . Diverticulitis   . DVT (deep venous thrombosis) ([place])    lower extremity  . H/O cardiac catheterization 2004   Normal coronary arteries  . Heart murmur   . History of stress test 05/21/2011  . Hx of echocardiogram 01/27/2010   Normal Ef 55% the transmitral spectral doppler flow pattern is normal for age. the left ventricular wall motion is normal  . IPF (idiopathic pulmonary fibrosis) ([place]) 01/2018  . Migraines   . Pancreatitis   . Thyroid disease   . TIA (transient ischemic attack)    8 years ago    Past Surgical History:  Procedure Laterality Date  . BREAST BIOPSY     [facility]  . CARDIAC CATHETERIZATION     10/2013  . CATARACT EXTRACTION Right 03/22/2018  . LEFT HEART CATHETERIZATION WITH CORONARY ANGIOGRAM N/A 10/25/2013   Procedure: LEFT HEART CATHETERIZATION WITH CORONARY ANGIOGRAM;  Surgeon: Troy Sine, MD;  Location: [facility] CATH LAB;  Service: Cardiovascular;  Laterality: N/A;  . NECK SURGERY     x's 2  . TOTAL KNEE ARTHROPLASTY     Bilateral x's 2  . VAGINAL HYSTERECTOMY  10/17/1998    [place] BRONCHOSCOPY Bilateral 01/24/2018   Procedure: VIDEO BRONCHOSCOPY WITH FLUORO;  Surgeon: [name], MD;  Location: [place];  Service: Cardiopulmonary;  Laterality: Bilateral;    Current Medications: Current Meds  Medication Sig  . amoxicillin-clavulanate (AUGMENTIN) 875-125 MG tablet Take 1 tablet by mouth 2 (two) times daily.  . Cholecalciferol (VITAMIN D3) 125 MCG (5000 UT) CAPS Take 5,000 Units by mouth daily.  . clopidogrel (PLAVIX) 75 MG tablet Take 1 tablet (75 mg total) by mouth daily.  Marland Kitchen diltiazem (CARDIZEM LA) 120 MG 24 hr tablet Take 1 tablet (120 mg total) by mouth daily.  . fluticasone (FLONASE) 50 MCG/ACT nasal spray Place 2 sprays into both nostrils daily.  . furosemide (LASIX) 40 MG tablet Take 1 tablet (40 mg total) by mouth 2 (two) times daily.  Marland Kitchen KLOR-CON M20 20 MEQ tablet TAKE 1 TABLET (20 MEQ TOTAL) BY MOUTH 2 (TWO) TIMES DAILY. WILL TAKE 3 A DAILY ONLY AS NEEDED.  . Multiple Vitamin (MULTIVITAMIN) tablet Take 1 tablet by mouth every other day. Gluten free/Vegetarian Multivitamin  . Nintedanib (OFEV) 100 MG CAPS Take by mouth.  . ondansetron (ZOFRAN-ODT) 4 MG disintegrating tablet TAKE 1 TABLET BY MOUTH EVERY 8 HOURS AS NEEDED FOR NAUSEA AND VOMITING  . SYNTHROID 112 MCG tablet TAKE 1 TABLET BY MOUTH EVERY DAY BEFORE BREAKFAST  .  vitamin C (ASCORBIC ACID) 500 MG tablet Take 1,000 mg by mouth 2 (two) times daily.   . vitamin E 1000 UNIT capsule Take 1,000 Units by mouth daily.     Allergies:   Prednisone, Atrovent hfa [ipratropium bromide hfa], Cheratussin ac [guaifenesin-codeine], Codeine, Hydrocodone, Influenza vaccines, Motrin [ibuprofen], and Oxycodone-acetaminophen   Social History   Socioeconomic History  . Marital status: Married    Spouse name: [name]  . Number of children: 3  . Years of education: [employer]  . Highest education level: Not on file  Occupational History  . Occupation: DISABLED    Employer: UNEMPLOYED   Tobacco Use  . Smoking status: Never Smoker  . Smokeless tobacco: Never Used  Vaping Use  . Vaping Use: Never used  Substance and Sexual Activity  . Alcohol use: No    Alcohol/week: 0.0 standard drinks  . Drug use: No  . Sexual activity: Not on file  Other Topics Concern  . Not on file  Social History Narrative   Lives with husband [name]   Caffeine use: coffee (2 cups per day)   Mostly right-handed   Social Determinants of Health   Financial Resource Strain: Not on file  Food Insecurity: Not on file  Transportation Needs: Not on file  Physical Activity: Not on file  Stress: Not on file  Social Connections: Not on file     Family History: The patient's family history includes Arthritis in an other family member; Colon cancer in her brother and father; Diabetes in her maternal grandmother; HIV in her brother; Heart disease in her maternal grandmother; Hypertension in her maternal grandmother; Kidney cancer in her brother; Lung cancer in her brother; Other in her brother; Ovarian cancer in her mother; Prostate cancer in her father; Stroke in her maternal grandmother; Uterine cancer in her mother.  ROS:   Review of Systems  Constitution: Negative for decreased appetite, fever and weight gain.  HENT: Negative for congestion, ear discharge, hoarse voice and sore throat.   Eyes: Negative for discharge, redness, vision loss in right eye and visual halos.  Cardiovascular: Negative for chest pain, dyspnea on exertion, leg swelling, orthopnea and palpitations.  Respiratory: Negative for cough, hemoptysis, shortness of breath and snoring.   Endocrine: Negative for heat intolerance and polyphagia.  Hematologic/Lymphatic: Negative for bleeding problem. Does not bruise/bleed easily.  Skin: Negative for flushing, nail changes, rash and suspicious lesions.  Musculoskeletal: Negative for arthritis, joint pain, muscle cramps, myalgias, neck pain and stiffness.  Gastrointestinal: Negative for  abdominal pain, bowel incontinence, diarrhea and excessive appetite.  Genitourinary: Negative for decreased libido, genital sores and incomplete emptying.  Neurological: Negative for brief paralysis, focal weakness, headaches and loss of balance.  Psychiatric/Behavioral: Negative for altered mental status, depression and suicidal ideas.  Allergic/Immunologic: Negative for HIV exposure and persistent infections.    EKGs/Labs/Other Studies Reviewed:    The following studies were reviewed today:   EKG: None today  Echocardiogram February 2021IMPRESSIONS    1. Left ventricular ejection fraction, by visual estimation, is 55 to  60%. The left ventricle has normal function. There is no left ventricular  hypertrophy.  2. Left ventricular diastolic parameters are consistent with Grade I  diastolic dysfunction (impaired relaxation).  3. The left ventricle has no regional wall motion abnormalities.  4. Global right ventricle has normal systolic function.The right  ventricular size is normal. No increase in right ventricular wall  thickness.  5. Left atrial size was normal.  6. Right atrial size was normal.  7. The mitral valve is normal in structure. Mild mitral valve  regurgitation. No evidence of mitral stenosis.  8. The tricuspid valve is normal in structure. Tricuspid valve  regurgitation is mild.  9. The aortic valve is normal in structure. Aortic valve regurgitation is  mild to moderate. No evidence of aortic valve sclerosis or stenosis.  10. The pulmonic valve was normal in structure. Pulmonic valve  regurgitation is not visualized.  11. There is mild dilatation of the ascending aorta measuring 38 mm.  12. Normal pulmonary artery systolic pressure.   FINDINGS  Left Ventricle: Left ventricular ejection fraction, by visual estimation,  is 55 to 60%. The left ventricle has normal function. The left ventricle  has no regional wall motion abnormalities. There is no left  ventricular  hypertrophy. Left ventricular  diastolic parameters are consistent with Grade I diastolic dysfunction  (impaired relaxation). Normal left atrial pressure.   Right Ventricle: The right ventricular size is normal. No increase in  right ventricular wall thickness. Global RV systolic function is has  normal systolic function. The tricuspid regurgitant velocity is 1.83 m/s,  and with an assumed right atrial pressure  of 3 mmHg, the estimated right ventricular systolic pressure is normal at  16.3 mmHg.   Left Atrium: Left atrial size was normal in size.   Right Atrium: Right atrial size was normal in size   Pericardium: There is no evidence of pericardial effusion.   Mitral Valve: The mitral valve is normal in structure. Mild mitral valve  regurgitation. No evidence of mitral valve stenosis by observation.   Tricuspid Valve: The tricuspid valve is normal in structure. Tricuspid  valve regurgitation is mild.   Aortic Valve: The aortic valve is normal in structure. Aortic valve  regurgitation is mild to moderate. Aortic regurgitation PHT measures 477  msec. The aortic valve is structurally normal, with no evidence of  sclerosis or stenosis.   Pulmonic Valve: The pulmonic valve was normal in structure. Pulmonic valve  regurgitation is not visualized. Pulmonic regurgitation is not visualized.   Aorta: Aortic dilatation noted. There is mild dilatation of the ascending  aorta measuring 38 mm.   Venous: The inferior vena cava is normal in size with greater than 50%  respiratory variability, suggesting right atrial pressure of 3 mmHg.   IAS/Shunts: No atrial level shunt detected by color flow Doppler. There is  no evidence of a patent foramen ovale. No ventricular septal defect is  seen or detected. There is no evidence of an atrial septal defect.   ZIO monitor October 2021 The patient wore the monitor for 12 days 21 hours starting 12/24/2019 . Indication:  Dizziness  The minimum heart rate was 43 bpm, maximum heart rate was 171 bpm, and average heart rate was 68 bpm. Predominant underlying rhythm was Sinus Rhythm. Rhythm. Bundle Branch Block/IVCD was present.   15 Supraventricular Tachycardia runs occurred, the run with the fastest interval lasting 6 beats with a max rate of 171bpm, the longest lasting 10 beats with an avg rate of 103 bpm.   Premature atrial complexes were rare (<1.0%).  Premature Ventricular complexes were rare (<1.0%). No ventricular tachycardia, no pauses, No AV block and no atrial fibrillation present.  12 patient triggered events: 1 associated with Supraventricular tachycardia, 3 associated with premature atrial complex and the remaining associated with sinus rhythm. 2 diary events noted all associated with sinus rhythm.   Conclusion: This study is remarkable for supraventricular tachycardia which is likely atrial tachycardia with variable block.  Recent Labs: 03/18/2020: ALT 26; Hemoglobin 14.0; Platelets 258.0 05/24/2020: Magnesium 2.2; TSH 1.460 07/04/2020: BUN 9; Creatinine, Ser 0.79; Potassium 3.8; Sodium 143  Recent Lipid Panel    Component Value Date/Time   CHOL 196 03/18/2020 0823   TRIG 91.0 03/18/2020 0823   HDL 58.70 03/18/2020 0823   CHOLHDL 3 03/18/2020 0823   VLDL 18.2 03/18/2020 0823   LDLCALC 119 (H) 03/18/2020 0823   LDLCALC 136 (H) 11/30/2019 0937    Physical Exam:    VS:  BP 124/78   Pulse 86   Ht 5\' 4"  (1.626 m)   Wt 134 lb (60.8 kg)   SpO2 97%   BMI 23.00 kg/m     Wt Readings from Last 3 Encounters:  07/17/20 134 lb (60.8 kg)  05/24/20 135 lb (61.2 kg)  05/14/20 136 lb (61.7 kg)     GEN: Well nourished, well developed in no acute distress HEENT: Normal NECK: No JVD; No carotid bruits LYMPHATICS: No lymphadenopathy CARDIAC: S1S2 noted,RRR, no murmurs, rubs, gallops RESPIRATORY:  Clear to auscultation without rales, wheezing or rhonchi  ABDOMEN: Soft, non-tender,  non-distended, +bowel sounds, no guarding. EXTREMITIES: No edema, No cyanosis, no clubbing MUSCULOSKELETAL:  No deformity  SKIN: Warm and dry NEUROLOGIC:  Alert and oriented x 3, non-focal PSYCHIATRIC:  Normal affect, good insight  ASSESSMENT:    1. PAT (paroxysmal atrial tachycardia) (HCC)    PLAN:    She tells me that the Cardizem has improved her symptoms but there are times where she have some outburts of symptoms. She notes this is not bothersome as it only last for few minutes. No change in her medication at this time  We discussed her CT scan results.  We will follow up annually.  The patient is in agreement with the above plan. The patient left the office in stable condition.  The patient will follow up in 1 year.   Medication Adjustments/Labs and Tests Ordered: Current medicines are reviewed at length with the patient today.  Concerns regarding medicines are outlined above.  No orders of the defined types were placed in this encounter.  No orders of the defined types were placed in this encounter.   Patient Instructions  Medication Instructions:  Your physician recommends that you continue on your current medications as directed. Please refer to the Current Medication list given to you today.  *If you need a refill on your cardiac medications before your next appointment, please call your pharmacy*   Lab Work: None If you have labs (blood work) drawn today and your tests are completely normal, you will receive your results only by: Marland Kitchen MyChart Message (if you have MyChart) OR . A paper copy in the mail If you have any lab test that is abnormal or we need to change your treatment, we will call you to review the results.   Testing/Procedures: None   Follow-Up: At [facility], you and your health needs are our priority.  As part of our continuing mission to provide you with exceptional heart care, we have created designated Provider Care Teams.  These Care Teams  include your primary Cardiologist (physician) and Advanced Practice Providers (APPs -  Physician Assistants and Nurse Practitioners) who all work together to provide you with the care you need, when you need it.  We recommend signing up for the patient portal called "MyChart".  Sign up information is provided on this After Visit Summary.  MyChart is used to connect with patients for Virtual Visits (Telemedicine).  Patients are able to view lab/test results, encounter notes, upcoming appointments, etc.  Non-urgent messages can be sent to your provider as well.   To learn more about what you can do with MyChart, go to [email or web address].    Your next appointment:   1 year(s)  The format for your next appointment:   In Person  Provider:   Berniece Salines, DO   Other Instructions      Adopting a Healthy Lifestyle.  Know what a healthy weight is for you (roughly BMI <25) and aim to maintain this   Aim for 7+ servings of fruits and vegetables daily   65-80+ fluid ounces of water or unsweet tea for healthy kidneys   Limit to max 1 drink of alcohol per day; avoid smoking/tobacco   Limit animal fats in diet for cholesterol and heart health - choose grass fed whenever available   Avoid highly processed foods, and foods high in saturated/trans fats   Aim for low stress - take time to unwind and care for your mental health   Aim for 150 min of moderate intensity exercise weekly for heart health, and weights twice weekly for bone health   Aim for 7-9 hours of sleep daily   When it comes to diets, agreement about the perfect plan isnt easy to find, even among the experts. Experts at the [employer] developed an idea known as the Healthy Eating Plate. Just imagine a plate divided into logical, healthy portions.   The emphasis is on diet quality:   Load up on vegetables and fruits - one-half of your plate: Aim for color and variety, and remember that potatoes dont  count.   Go for whole grains - one-quarter of your plate: Whole wheat, barley, wheat berries, quinoa, oats, brown rice, and foods made with them. If you want pasta, go with whole wheat pasta.   Protein power - one-quarter of your plate: Fish, chicken, beans, and nuts are all healthy, versatile protein sources. Limit red meat.   The diet, however, does go beyond the plate, offering a few other suggestions.   Use healthy plant oils, such as olive, canola, soy, corn, sunflower and peanut. Check the labels, and avoid partially hydrogenated oil, which have unhealthy trans fats.   If youre thirsty, drink water. Coffee and tea are good in moderation, but skip sugary drinks and limit milk and dairy products to one or two daily servings.   The type of carbohydrate in the diet is more important than the amount. Some sources of carbohydrates, such as vegetables, fruits, whole grains, and beans-are healthier than others.   Finally, stay active  Signed, Berniece Salines, DO  07/17/2020 2:29 PM    [place]

## 2020-07-22 DIAGNOSIS — M546 Pain in thoracic spine: Secondary | ICD-10-CM | POA: Diagnosis not present

## 2020-07-26 DIAGNOSIS — M546 Pain in thoracic spine: Secondary | ICD-10-CM | POA: Diagnosis not present

## 2020-07-31 ENCOUNTER — Other Ambulatory Visit: Payer: Self-pay

## 2020-07-31 ENCOUNTER — Ambulatory Visit (INDEPENDENT_AMBULATORY_CARE_PROVIDER_SITE_OTHER): Payer: Medicare Other | Admitting: Family

## 2020-07-31 DIAGNOSIS — I2510 Atherosclerotic heart disease of native coronary artery without angina pectoris: Secondary | ICD-10-CM

## 2020-07-31 DIAGNOSIS — J0141 Acute recurrent pansinusitis: Secondary | ICD-10-CM

## 2020-07-31 MED ORDER — AMOXICILLIN-POT CLAVULANATE 875-125 MG PO TABS: 1.0000 | ORAL_TABLET | Freq: Two times a day (BID) | ORAL | 0 refills | Status: DC

## 2020-07-31 NOTE — Progress Notes (Deleted)
   Subjective:    Patient ID: Alexandria [name], female    DOB: [birth year], 75 y.o.   MRN: [id number]  HPI  Facial pressure, + post nasal drip.    Review of Systems     Objective:   Physical Exam        Assessment & Plan:

## 2020-08-01 NOTE — Progress Notes (Signed)
Virtual Visit via Video Note  I connected with Alexandria [name] on 08/01/20 at 11:20 AM EDT by a video enabled telemedicine application and verified that I am speaking with the correct person using two identifiers.  Location: Patient: home Provider: work   I discussed the limitations of evaluation and management by telemedicine and the availability of in person appointments. The patient expressed understanding and agreed to proceed. Only the patient and myself were present for today's video call.   History of Present Illness:  Patient is a 75 yr old female who presents today with chief complaint of sinus pressure and post nasal drip. She reports hx of recurrent sinus infections which often require "2 rounds of antibiotics."  She was treated on 07/15/20 by PCP with a 10 day course of augmentin. She noted improvement in symptoms but no resolution.     Observations/Objective:   Gen: Awake, alert, no acute distress Resp: Breathing is even and non-labored Psych: calm/pleasant demeanor Neuro: Alert and Oriented x 3, + facial symmetry, speech is clear.   Assessment and Plan:  Sinusitis- uncontrolled. Will rx with an addition 10 day course of augmentin 875mg  bid. Continue flonase 2 sprays each nostril daily. Pt is advised to call if symptoms worsen or if symptoms fail to improve. Pt verbalizes understanding.  Follow Up Instructions:    I discussed the assessment and treatment plan with the patient. The patient was provided an opportunity to ask questions and all were answered. The patient agreed with the plan and demonstrated an understanding of the instructions.   The patient was advised to call back or seek an in-person evaluation if the symptoms worsen or if the condition fails to improve as anticipated.  Nance Pear, NP

## 2020-08-02 DIAGNOSIS — M546 Pain in thoracic spine: Secondary | ICD-10-CM | POA: Diagnosis not present

## 2020-08-06 IMAGING — DX DG HAND COMPLETE 3+V*R*
3 series · 3 of 3 positions shown · non-contrast
Comparison: Little finger radiograph 05/24/2019

CLINICAL DATA: previous injury to hand one week ago but only little
finger was imaged. Having middle finger pain,swelling and bruising
and wants assessed.

EXAM:
RIGHT HAND - COMPLETE 3+ VIEW

[hand pa]
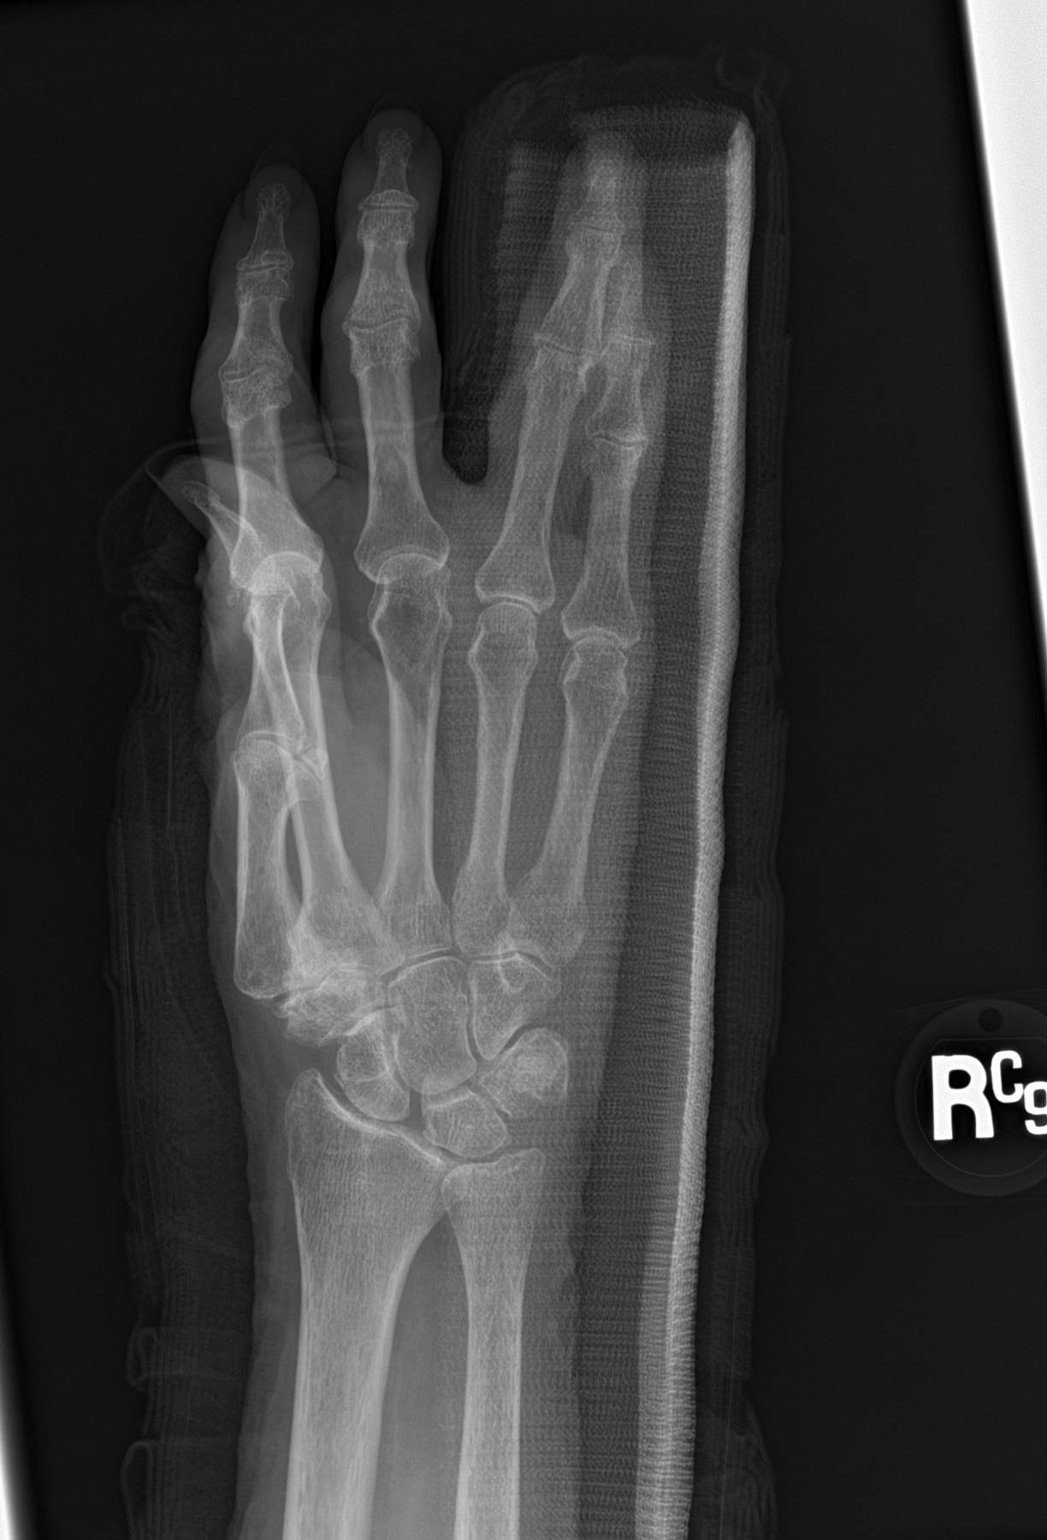

[hand obl]
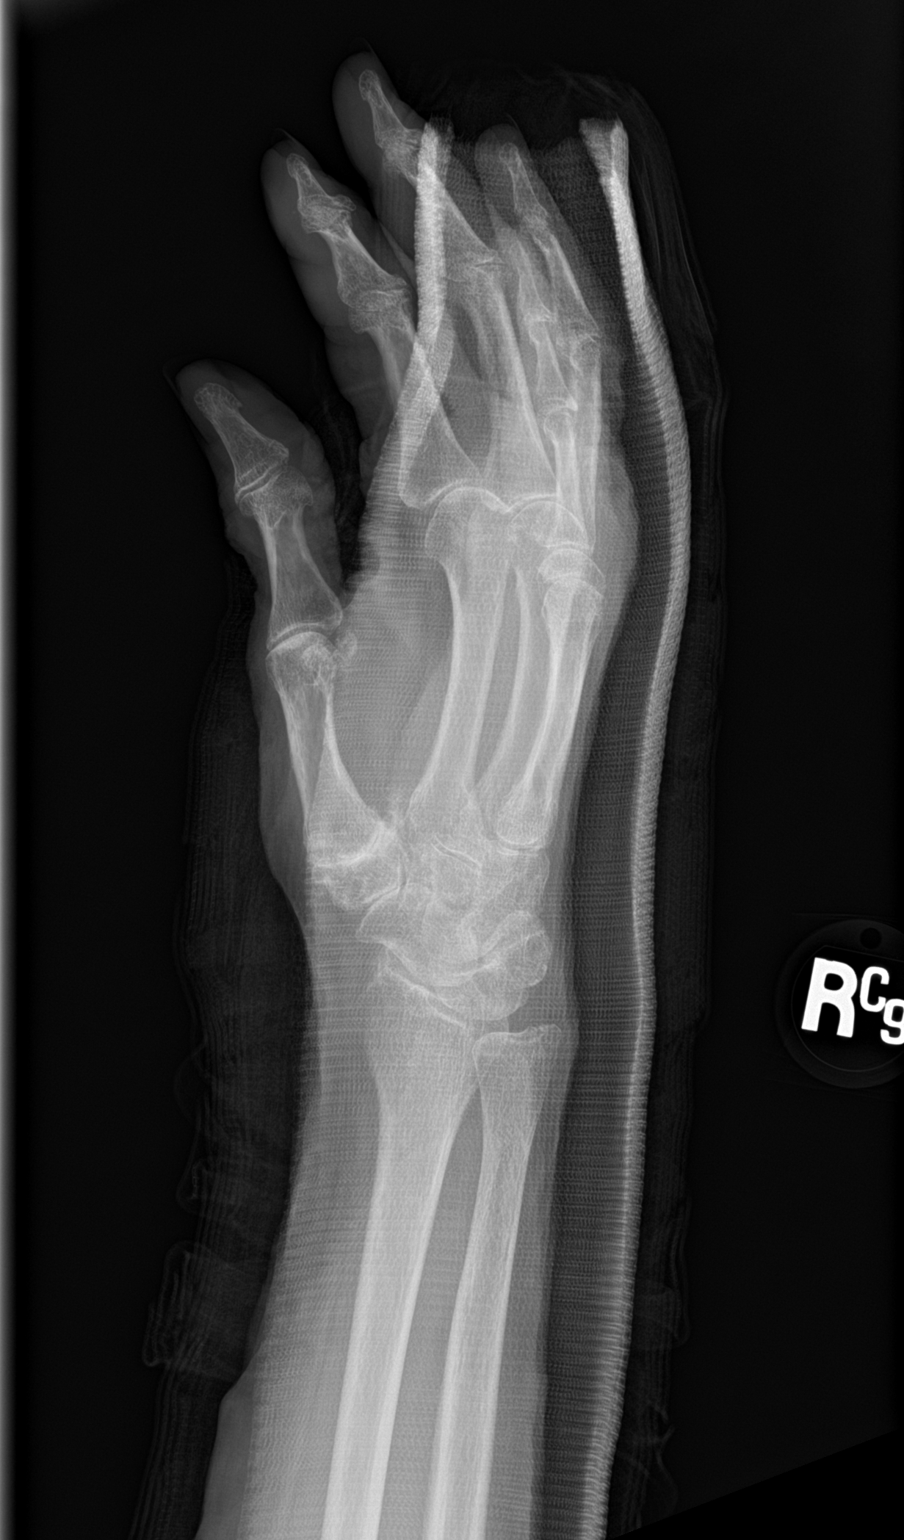

[hand lat]
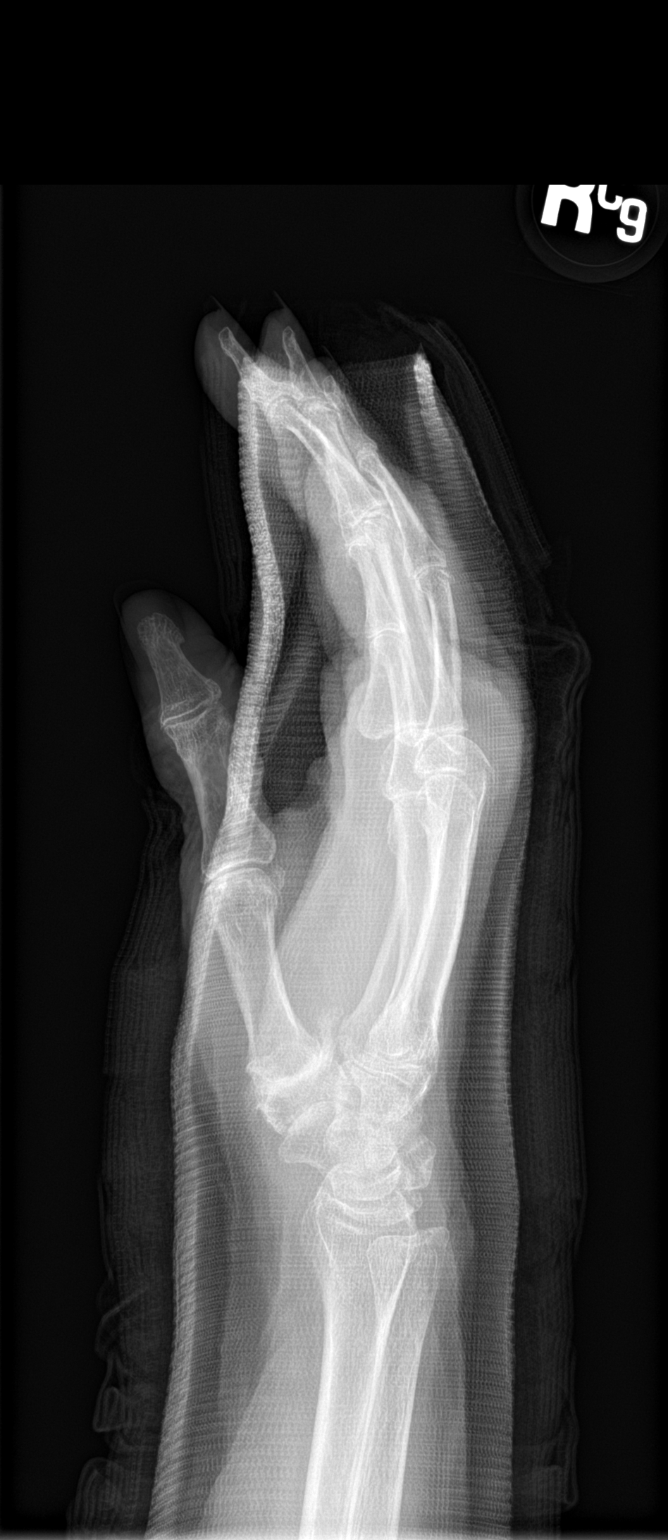

[3 of 3 positions shown; findings below may reference images not displayed]

FINDINGS: Overlying splint material in place limits osseous and soft tissue
fine detail. Previous fifth digit dislocation at the proximal
interphalangeal joint has been reduced. Small fracture fragment on
prior exam is not well visualized due to overlying splint material.
No evidence of acute fracture of the middle finger. No dislocation.
Multifocal osteoarthritis primarily at the base of the thumb.
IMPRESSION: 1. No evidence of acute fracture or dislocation of the middle
finger. The oblique and lateral views partially obscured by
overlying splint material.
2. Normal alignment of the fifth digit after previous subluxation,
previous small fracture fragments on prior exam not well visualized.

## 2020-08-14 ENCOUNTER — Other Ambulatory Visit: Payer: Self-pay | Admitting: Family Medicine

## 2020-08-16 DIAGNOSIS — M546 Pain in thoracic spine: Secondary | ICD-10-CM | POA: Diagnosis not present

## 2020-08-17 DIAGNOSIS — Z20822 Contact with and (suspected) exposure to covid-19: Secondary | ICD-10-CM | POA: Diagnosis not present

## 2020-08-17 DIAGNOSIS — J0141 Acute recurrent pansinusitis: Secondary | ICD-10-CM | POA: Diagnosis not present

## 2020-08-18 DIAGNOSIS — T50905A Adverse effect of unspecified drugs, medicaments and biological substances, initial encounter: Secondary | ICD-10-CM | POA: Diagnosis not present

## 2020-08-18 DIAGNOSIS — R21 Rash and other nonspecific skin eruption: Secondary | ICD-10-CM | POA: Diagnosis not present

## 2020-08-20 DIAGNOSIS — M546 Pain in thoracic spine: Secondary | ICD-10-CM | POA: Diagnosis not present

## 2020-08-27 ENCOUNTER — Ambulatory Visit: Payer: Medicare Other | Admitting: Cardiology

## 2020-08-29 DIAGNOSIS — R0981 Nasal congestion: Secondary | ICD-10-CM | POA: Diagnosis not present

## 2020-08-29 DIAGNOSIS — J0141 Acute recurrent pansinusitis: Secondary | ICD-10-CM | POA: Diagnosis not present

## 2020-08-29 DIAGNOSIS — Z20822 Contact with and (suspected) exposure to covid-19: Secondary | ICD-10-CM | POA: Diagnosis not present

## 2020-08-29 DIAGNOSIS — J060 Acute laryngopharyngitis: Secondary | ICD-10-CM | POA: Diagnosis not present

## 2020-08-29 DIAGNOSIS — R059 Cough, unspecified: Secondary | ICD-10-CM | POA: Diagnosis not present

## 2020-09-05 DIAGNOSIS — J060 Acute laryngopharyngitis: Secondary | ICD-10-CM | POA: Diagnosis not present

## 2020-09-05 DIAGNOSIS — J0141 Acute recurrent pansinusitis: Secondary | ICD-10-CM | POA: Diagnosis not present

## 2020-09-08 DIAGNOSIS — R0989 Other specified symptoms and signs involving the circulatory and respiratory systems: Secondary | ICD-10-CM | POA: Diagnosis not present

## 2020-09-08 DIAGNOSIS — J0141 Acute recurrent pansinusitis: Secondary | ICD-10-CM | POA: Diagnosis not present

## 2020-09-08 DIAGNOSIS — J060 Acute laryngopharyngitis: Secondary | ICD-10-CM | POA: Diagnosis not present

## 2020-09-13 DIAGNOSIS — M546 Pain in thoracic spine: Secondary | ICD-10-CM | POA: Diagnosis not present

## 2020-09-18 DIAGNOSIS — R040 Epistaxis: Secondary | ICD-10-CM | POA: Diagnosis not present

## 2020-09-18 DIAGNOSIS — J329 Chronic sinusitis, unspecified: Secondary | ICD-10-CM | POA: Diagnosis not present

## 2020-09-19 ENCOUNTER — Encounter: Payer: Self-pay | Admitting: Family Medicine

## 2020-09-19 ENCOUNTER — Other Ambulatory Visit: Payer: Self-pay

## 2020-09-19 ENCOUNTER — Telehealth (INDEPENDENT_AMBULATORY_CARE_PROVIDER_SITE_OTHER): Payer: Medicare Other | Admitting: Family Medicine

## 2020-09-19 DIAGNOSIS — J0141 Acute recurrent pansinusitis: Secondary | ICD-10-CM | POA: Diagnosis not present

## 2020-09-19 DIAGNOSIS — I2510 Atherosclerotic heart disease of native coronary artery without angina pectoris: Secondary | ICD-10-CM | POA: Diagnosis not present

## 2020-09-19 MED ORDER — AMOXICILLIN-POT CLAVULANATE 875-125 MG PO TABS: 1.0000 | ORAL_TABLET | Freq: Two times a day (BID) | ORAL | 0 refills | Status: DC

## 2020-09-19 NOTE — Assessment & Plan Note (Signed)
Recurrent Pt has app with ent Monday and will have Ct as well  Augmentin sent in

## 2020-09-19 NOTE — Progress Notes (Addendum)
Subjective:   By signing my name below, I, Alexandria [name], attest that this documentation has been prepared under the direction and in the presence of Dr. Roma Schanz, DO. 09/19/2020  Virtual Visit via Video Note  I connected with [name] on 10/29/20 at  9:40 AM EDT by a video enabled telemedicine application and verified that I am speaking with the correct person using two identifiers.  Location: Patient: home alone  Provider: in office    I discussed the limitations of evaluation and management by telemedicine and the availability of in person appointments. The patient expressed understanding and agreed to proceed.     Patient ID: Alexandria [name], female    DOB: March 06, 1946, 75 y.o.   MRN: [id number]  Chief Complaint  Patient presents with   Headache    HPI Patient is in today for a office visit.   Pt c/o sinus congestion and sinus headaches x February   [facility]  She complains of 3 nose bleeds yesterday---  [facility] and CT sinuses .       Past Medical History:  Diagnosis Date   Arthritis    Asthmatic bronchitis with acute exacerbation 02/06/2015   Bronchial pneumonia    Colon polyp    Diverticulitis    DVT (deep venous thrombosis) (HCC)    lower extremity   H/O cardiac catheterization 2004   Normal coronary arteries   Heart murmur    History of stress test 05/21/2011   Hx of echocardiogram 01/27/2010   Normal Ef 55% the transmitral spectral doppler flow pattern is normal for age. the left ventricular wall motion is normal   IPF (idiopathic pulmonary fibrosis) ([place]) 01/2018   Migraines    Pancreatitis    Thyroid disease    TIA (transient ischemic attack)    8 years ago    Past Surgical History:  Procedure Laterality Date   BREAST BIOPSY     [place]   CARDIAC CATHETERIZATION     10/2013   CATARACT EXTRACTION Right 03/22/2018   LEFT HEART CATHETERIZATION WITH CORONARY ANGIOGRAM N/A  10/25/2013   Procedure: LEFT HEART CATHETERIZATION WITH CORONARY ANGIOGRAM;  Surgeon: Troy Sine, MD;  Location: [facility] CATH LAB;  Service: Cardiovascular;  Laterality: N/A;   NECK SURGERY     x's 2   TOTAL KNEE ARTHROPLASTY     Bilateral x's 2   VAGINAL HYSTERECTOMY  10/17/1998   [name]   VIDEO BRONCHOSCOPY Bilateral 01/24/2018   Procedure: VIDEO BRONCHOSCOPY WITH FLUORO;  Surgeon: [name], MD;  Location: [place];  Service: Cardiopulmonary;  Laterality: Bilateral;    Family History  Problem Relation Age of Onset   Arthritis Other    Colon cancer Father    Prostate cancer Father    HIV Brother        1982   Kidney cancer Brother    Colon cancer Brother    Lung cancer Brother    Other Brother        Mouth Cancer   Ovarian cancer Mother    Uterine cancer Mother    Heart disease Maternal Grandmother    Stroke Maternal Grandmother    Hypertension Maternal Grandmother    Diabetes Maternal Grandmother     Social History   Socioeconomic History   Marital status: Married    Spouse name: [name]   Number of children: 3   Years of education: [employer]  Highest education level: Not on file  Occupational History   Occupation: DISABLED    Employer: UNEMPLOYED  Tobacco Use   Smoking status: Never   Smokeless tobacco: Never  Vaping Use   Vaping Use: Never used  Substance and Sexual Activity   Alcohol use: No    Alcohol/week: 0.0 standard drinks   Drug use: No   Sexual activity: Not on file  Other Topics Concern   Not on file  Social History Narrative   Lives with husband [name]   Caffeine use: coffee (2 cups per day)   Mostly right-handed   Social Determinants of Health   Financial Resource Strain: Not on file  Food Insecurity: Not on file  Transportation Needs: Not on file  Physical Activity: Not on file  Stress: Not on file  Social Connections: Not on file  Intimate Partner Violence: Not on file    Outpatient Medications Prior to Visit   Medication Sig Dispense Refill   Cholecalciferol (VITAMIN D3) 125 MCG (5000 UT) CAPS Take 5,000 Units by mouth daily.     clopidogrel (PLAVIX) 75 MG tablet Take 1 tablet (75 mg total) by mouth daily. 90 tablet 3   diltiazem (CARDIZEM LA) 120 MG 24 hr tablet Take 1 tablet (120 mg total) by mouth daily. 90 tablet 3   fluticasone (FLONASE) 50 MCG/ACT nasal spray Place 2 sprays into both nostrils daily. 16 g 6   furosemide (LASIX) 40 MG tablet Take 1 tablet (40 mg total) by mouth 2 (two) times daily. 180 tablet 1   KLOR-CON M20 20 MEQ tablet TAKE 1 TABLET (20 MEQ TOTAL) BY MOUTH 2 (TWO) TIMES DAILY. WILL TAKE 3 A DAILY ONLY AS NEEDED. 270 tablet 1   Multiple Vitamin (MULTIVITAMIN) tablet Take 1 tablet by mouth every other day. Gluten free/Vegetarian Multivitamin     Nintedanib (OFEV) 100 MG CAPS Take by mouth.     ondansetron (ZOFRAN-ODT) 4 MG disintegrating tablet TAKE 1 TABLET BY MOUTH EVERY 8 HOURS AS NEEDED FOR NAUSEA AND VOMITING 60 tablet 2   SYNTHROID 112 MCG tablet TAKE 1 TABLET BY MOUTH EVERY DAY BEFORE BREAKFAST 90 tablet 1   vitamin C (ASCORBIC ACID) 500 MG tablet Take 1,000 mg by mouth 2 (two) times daily.      vitamin E 1000 UNIT capsule Take 1,000 Units by mouth daily.     amoxicillin-clavulanate (AUGMENTIN) 875-125 MG tablet Take 1 tablet by mouth 2 (two) times daily. 20 tablet 0   No facility-administered medications prior to visit.    Allergies  Allergen Reactions   Prednisone Other (See Comments)    Changed personality    Atrovent Hfa [Ipratropium Bromide Hfa] Other (See Comments)    Pt could not sleep   Cheratussin Ac [Guaifenesin-Codeine]    Codeine Other (See Comments)    Pt takes promethazine with codeine at home   Hydrocodone Itching   Influenza Vaccines Other (See Comments)    Pt reports heart attack after last flu shot   Motrin [Ibuprofen] Other (See Comments)    "Gives false reading in blood"   Oxycodone-Acetaminophen Itching    Review of Systems   Constitutional:  Negative for chills, fever and malaise/fatigue.  HENT:  Positive for congestion, nosebleeds and sinus pain. Negative for hearing loss.   Eyes:  Negative for discharge.  Respiratory:  Negative for cough, sputum production and shortness of breath.   Cardiovascular:  Negative for chest pain, palpitations and leg swelling.  Gastrointestinal:  Negative for abdominal pain, blood in  stool, constipation, diarrhea, heartburn, nausea and vomiting.  Genitourinary:  Negative for dysuria, frequency, hematuria and urgency.  Musculoskeletal:  Negative for back pain, falls and myalgias.  Skin:  Negative for rash.  Neurological:  Negative for dizziness, sensory change, loss of consciousness, weakness and headaches.  Endo/Heme/Allergies:  Negative for environmental allergies. Does not bruise/bleed easily.  Psychiatric/Behavioral:  Negative for depression and suicidal ideas. The patient is not nervous/anxious and does not have insomnia.       Objective:    Physical Exam Vitals and nursing note reviewed.  Constitutional:      General: She is not in acute distress.    Appearance: Normal appearance. She is not ill-appearing.  HENT:     Nose:     Right Sinus: Maxillary sinus tenderness and frontal sinus tenderness present.     Left Sinus: Maxillary sinus tenderness and frontal sinus tenderness present.  Neurological:     Mental Status: She is alert and oriented to person, place, and time.  Psychiatric:        Mood and Affect: Mood normal.        Behavior: Behavior normal.        Thought Content: Thought content normal.    There were no vitals taken for this visit. Wt Readings from Last 3 Encounters:  10/18/20 142 lb (64.4 kg)  07/17/20 134 lb (60.8 kg)  05/24/20 135 lb (61.2 kg)    Diabetic Foot Exam - Simple   No data filed    Lab Results  Component Value Date   WBC 6.0 10/18/2020   HGB 13.0 10/18/2020   HCT 38.1 10/18/2020   PLT 228.0 10/18/2020   GLUCOSE 90 10/18/2020    CHOL 196 03/18/2020   TRIG 91.0 03/18/2020   HDL 58.70 03/18/2020   LDLCALC 119 (H) 03/18/2020   ALT 20 10/18/2020   AST 24 10/18/2020   NA 141 10/18/2020   K 3.8 10/18/2020   CL 102 10/18/2020   CREATININE 0.84 10/18/2020   BUN 13 10/18/2020   CO2 33 (H) 10/18/2020   TSH 1.460 05/24/2020   INR 1.1 (H) 05/07/2015   HGBA1C 5.4 03/18/2020    Lab Results  Component Value Date   TSH 1.460 05/24/2020   Lab Results  Component Value Date   WBC 6.0 10/18/2020   HGB 13.0 10/18/2020   HCT 38.1 10/18/2020   MCV 97.3 10/18/2020   PLT 228.0 10/18/2020   Lab Results  Component Value Date   NA 141 10/18/2020   K 3.8 10/18/2020   CO2 33 (H) 10/18/2020   GLUCOSE 90 10/18/2020   BUN 13 10/18/2020   CREATININE 0.84 10/18/2020   BILITOT 0.5 10/18/2020   ALKPHOS 71 10/18/2020   AST 24 10/18/2020   ALT 20 10/18/2020   PROT 6.5 10/18/2020   ALBUMIN 3.8 10/18/2020   CALCIUM 9.3 10/18/2020   ANIONGAP 12 11/13/2019   EGFR 78 07/04/2020   GFR 67.78 10/18/2020   Lab Results  Component Value Date   CHOL 196 03/18/2020   Lab Results  Component Value Date   HDL 58.70 03/18/2020   Lab Results  Component Value Date   LDLCALC 119 (H) 03/18/2020   Lab Results  Component Value Date   TRIG 91.0 03/18/2020   Lab Results  Component Value Date   CHOLHDL 3 03/18/2020   Lab Results  Component Value Date   HGBA1C 5.4 03/18/2020       Assessment & Plan:   Problem List Items Addressed This Visit  Unprioritized   Pansinusitis    Recurrent Pt has app with ent Monday and will have Ct as well  Augmentin sent in           Meds ordered this encounter  Medications   DISCONTD: amoxicillin-clavulanate (AUGMENTIN) 875-125 MG tablet    Sig: Take 1 tablet by mouth 2 (two) times daily.    Dispense:  20 tablet    Refill:  0    I, Dr. Roma Schanz, DO, personally preformed the services described in this documentation.  All medical record entries made by the  scribe were at my direction and in my presence.  I have reviewed the chart and discharge instructions (if applicable) and agree that the record reflects my personal performance and is accurate and complete. 09/19/2020   I,Alexandria [name],acting as a Education administrator for [employer], DO.,have documented all relevant documentation on the behalf of [name], DO,as directed by  [name], DO while in the presence of [name], DO.   [name], DO

## 2020-09-20 ENCOUNTER — Telehealth: Payer: Self-pay | Admitting: Family Medicine

## 2020-09-20 DIAGNOSIS — J329 Chronic sinusitis, unspecified: Secondary | ICD-10-CM | POA: Diagnosis not present

## 2020-09-20 DIAGNOSIS — R040 Epistaxis: Secondary | ICD-10-CM | POA: Diagnosis not present

## 2020-09-20 DIAGNOSIS — J321 Chronic frontal sinusitis: Secondary | ICD-10-CM | POA: Diagnosis not present

## 2020-09-20 NOTE — Telephone Encounter (Signed)
Pt came in office wanting to drop off CD with [facility] results but pt was informed that provider does not have CD reader and for provider to ok to pull her results from the system of [facility] that was done today.

## 2020-09-20 NOTE — Telephone Encounter (Signed)
CT printed from careeverywhere and in your folder for Monday.

## 2020-09-23 DIAGNOSIS — G44219 Episodic tension-type headache, not intractable: Secondary | ICD-10-CM | POA: Diagnosis not present

## 2020-09-25 NOTE — Telephone Encounter (Signed)
Pt called. Left detailed VM

## 2020-10-02 DIAGNOSIS — J301 Allergic rhinitis due to pollen: Secondary | ICD-10-CM | POA: Diagnosis not present

## 2020-10-16 DIAGNOSIS — H35432 Paving stone degeneration of retina, left eye: Secondary | ICD-10-CM | POA: Diagnosis not present

## 2020-10-16 DIAGNOSIS — H43811 Vitreous degeneration, right eye: Secondary | ICD-10-CM | POA: Diagnosis not present

## 2020-10-16 DIAGNOSIS — H31092 Other chorioretinal scars, left eye: Secondary | ICD-10-CM | POA: Diagnosis not present

## 2020-10-16 DIAGNOSIS — H43391 Other vitreous opacities, right eye: Secondary | ICD-10-CM | POA: Diagnosis not present

## 2020-10-18 ENCOUNTER — Ambulatory Visit (INDEPENDENT_AMBULATORY_CARE_PROVIDER_SITE_OTHER): Payer: Medicare Other | Admitting: Medical

## 2020-10-18 ENCOUNTER — Other Ambulatory Visit: Payer: Self-pay

## 2020-10-18 ENCOUNTER — Ambulatory Visit (HOSPITAL_BASED_OUTPATIENT_CLINIC_OR_DEPARTMENT_OTHER)
Admission: RE | Admit: 2020-10-18 | Discharge: 2020-10-18 | Disposition: A | Discharge status: Home / Self Care | Payer: Medicare Other | Source: Ambulatory Visit | Attending: Medical | Admitting: Medical

## 2020-10-18 ENCOUNTER — Other Ambulatory Visit: Payer: Self-pay | Admitting: Family Medicine

## 2020-10-18 VITALS — BP 138/75 | HR 71 | Temp 98.1°F | Resp 20 | Ht 61.0 in | Wt 142.0 lb

## 2020-10-18 DIAGNOSIS — J3489 Other specified disorders of nose and nasal sinuses: Secondary | ICD-10-CM | POA: Diagnosis not present

## 2020-10-18 DIAGNOSIS — R519 Headache, unspecified: Secondary | ICD-10-CM

## 2020-10-18 DIAGNOSIS — S46812A Strain of other muscles, fascia and tendons at shoulder and upper arm level, left arm, initial encounter: Secondary | ICD-10-CM

## 2020-10-18 DIAGNOSIS — K5900 Constipation, unspecified: Secondary | ICD-10-CM

## 2020-10-18 DIAGNOSIS — I2510 Atherosclerotic heart disease of native coronary artery without angina pectoris: Secondary | ICD-10-CM | POA: Diagnosis not present

## 2020-10-18 LAB — CBC WITH DIFFERENTIAL/PLATELET
Basophils Absolute: 0.1 10*3/uL (ref 0.0–0.1)
Basophils Relative: 1.4 % (ref 0.0–3.0)
Eosinophils Absolute: 0.6 10*3/uL (ref 0.0–0.7)
Eosinophils Relative: 10.5 % — ABNORMAL HIGH (ref 0.0–5.0)
HCT: 38.1 % (ref 36.0–46.0)
Hemoglobin: 13 g/dL (ref 12.0–15.0)
Lymphocytes Relative: 30 % (ref 12.0–46.0)
Lymphs Abs: 1.8 10*3/uL (ref 0.7–4.0)
MCHC: 34.1 g/dL (ref 30.0–36.0)
MCV: 97.3 fl (ref 78.0–100.0)
Monocytes Absolute: 0.9 10*3/uL (ref 0.1–1.0)
Monocytes Relative: 14.2 % — ABNORMAL HIGH (ref 3.0–12.0)
Neutro Abs: 2.7 10*3/uL (ref 1.4–7.7)
Neutrophils Relative %: 43.9 % (ref 43.0–77.0)
Platelets: 228 10*3/uL (ref 150.0–400.0)
RBC: 3.92 Mil/uL (ref 3.87–5.11)
RDW: 13.9 % (ref 11.5–15.5)
WBC: 6 10*3/uL (ref 4.0–10.5)

## 2020-10-18 LAB — COMPREHENSIVE METABOLIC PANEL
ALT: 20 U/L (ref 0–35)
AST: 24 U/L (ref 0–37)
Albumin: 3.8 g/dL (ref 3.5–5.2)
Alkaline Phosphatase: 71 U/L (ref 39–117)
BUN: 13 mg/dL (ref 6–23)
CO2: 33 mEq/L — ABNORMAL HIGH (ref 19–32)
Calcium: 9.3 mg/dL (ref 8.4–10.5)
Chloride: 102 mEq/L (ref 96–112)
Creatinine, Ser: 0.84 mg/dL (ref 0.40–1.20)
GFR: 67.78 mL/min (ref >60.00)
Glucose, Bld: 90 mg/dL (ref 70–99)
Potassium: 3.8 mEq/L (ref 3.5–5.1)
Sodium: 141 mEq/L (ref 135–145)
Total Bilirubin: 0.5 mg/dL (ref 0.2–1.2)
Total Protein: 6.5 g/dL (ref 6.0–8.3)

## 2020-10-18 LAB — SEDIMENTATION RATE: Sed Rate: 23 mm/hr (ref 0–30)

## 2020-10-18 MED ORDER — AMOXICILLIN-POT CLAVULANATE 875-125 MG PO TABS: 1.0000 | ORAL_TABLET | Freq: Two times a day (BID) | ORAL | 0 refills | Status: DC

## 2020-10-18 MED ORDER — CYCLOBENZAPRINE HCL 5 MG PO TABS: 5.0000 mg | ORAL_TABLET | Freq: Every day | ORAL | 0 refills | Status: DC

## 2020-10-18 NOTE — Patient Instructions (Addendum)
For new onset chronic daily headache for past 6 months different from former headaches, decided to order CT of head without contrast.  You do have some left occipital area pain with some pain in upper trapezius.  Will prescribe Flexeril low-dose today to see if you respond to this.  If headache resolves with muscle relaxant then with think may have tension headache.  Can use Tylenol over-the-counter as well.  Avoid NSAIDs since on Plavix.  I decided to add sed rate to your blood work today.  If sed rate were to be elevated would likely prescribe tapered low-dose Medrol.   Good neurologic exam today.  If he has worsening headache will motor or sensory deficit type of presentation then recommend ED evaluation.  Asking staff to get prior authorization for stat CT.  Left maxillary sinus pressure and recent imaging showed some mucus in left maxillary sinus area.  Prescribe Augmentin antibiotic.  Recent constipation.  We will add CBC and metabolic panel.  You report that you increase intake of vegetables and that typically resolve your constipation.  If not then you could try half a bottle of magnesium citrate over-the-counter.  Advising this instead of Dulcolax or MiraLAX since she reports that it does not help in the past.  If any persisting constipation with severe pain then recommend ED evaluation.  Follow-up in 7 to 10 days or as needed.  Note I am not in office next week so if symptoms are persisting primary care provider or other provider might be able to seen.

## 2020-10-18 NOTE — Progress Notes (Signed)
Subjective:    Patient ID: Alexandria [name], female    DOB: 11-08-1945, 75 y.o.   MRN: [id number]  HPI  Pt in for evaluation of ha. She states chronic ha on and off for years. Pt states recently ha is more in the occipital region left side.   Pt states in past when she gets ha attributed to sinus infections and migraines. Pt states in past migraine ha responed to change in diet.  Pt does report hx of sinus infections in past associated with allergies.  Pt left occipital area recently presently for 2 months. Pt has seen oral surgeon, ent, retinal specialist and your dentist.   None of the above found cause for ha.   Pt had recent ct sinus [facility].   Pt ct results were below.  IMPRESSION:  Persistent trace mucosal thickening along the floor of the left  maxillary sinus. 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   The paranasal sinuses are otherwise normally aerated. Patent sinus  drainage pathways.     Pt states ha intially today was 10/10.     Review of Systems  Constitutional:  Negative for chills, fatigue and fever.  HENT:  Positive for sinus pressure and sinus pain. Negative for congestion.   Respiratory:  Negative for cough, chest tightness and wheezing.   Cardiovascular:  Negative for chest pain and palpitations.  Gastrointestinal:  Negative for abdominal pain.  Musculoskeletal:  Negative for back pain.  Neurological:  Positive for headaches. Negative for dizziness, syncope, speech difficulty, weakness and numbness.  Hematological:  Negative for adenopathy. Does not bruise/bleed easily.  Psychiatric/Behavioral:  Negative for agitation, behavioral problems, confusion and dysphoric mood.     Past Medical History:  Diagnosis Date   Arthritis    Asthmatic bronchitis with acute exacerbation 02/06/2015   Bronchial pneumonia    Colon polyp    Diverticulitis     DVT (deep venous thrombosis) (HCC)    lower extremity   H/O cardiac catheterization 2004   Normal coronary arteries   Heart murmur    History of stress test 05/21/2011   Hx of echocardiogram 01/27/2010   Normal Ef 55% the transmitral spectral doppler flow pattern is normal for age. the left ventricular wall motion is normal   IPF (idiopathic pulmonary fibrosis) ([place]) 01/2018   Migraines    Pancreatitis    Thyroid disease    TIA (transient ischemic attack)    8 years ago     Social History   Socioeconomic History   Marital status: Married    Spouse name: [name]   Number of children: 3   Years of education: [employer] college   Highest education level: Not on file  Occupational History   Occupation: DISABLED    Employer: UNEMPLOYED  Tobacco Use   Smoking status: Never   Smokeless tobacco: Never  Vaping Use   Vaping Use: Never used  Substance and Sexual Activity   Alcohol use: No    Alcohol/week: 0.0 standard drinks   Drug use: No   Sexual activity: Not on file  Other Topics Concern   Not on file  Social History Narrative   Lives with husband [name]   Caffeine use: coffee (2 cups per day)   Mostly right-handed   Social Determinants of Health   Financial Resource Strain: Not on file  Food Insecurity: Not on file  Transportation Needs: Not on file  Physical Activity: Not on file  Stress: Not on file  Social Connections: Not on file  Intimate Partner Violence: Not on file    Past Surgical History:  Procedure Laterality Date   BREAST BIOPSY     [place]     10/2013   CATARACT EXTRACTION Right 03/22/2018   LEFT HEART CATHETERIZATION WITH CORONARY ANGIOGRAM N/A 10/25/2013   Procedure: LEFT HEART CATHETERIZATION WITH CORONARY ANGIOGRAM;  Surgeon: Troy Sine, MD;  Location: [facility] CATH LAB;  Service: Cardiovascular;  Laterality: N/A;   NECK SURGERY     x's 2   TOTAL KNEE ARTHROPLASTY     Bilateral x's 2   VAGINAL HYSTERECTOMY  10/17/1998   Ron  Nori Riis   VIDEO BRONCHOSCOPY Bilateral 01/24/2018   Procedure: VIDEO BRONCHOSCOPY WITH FLUORO;  Surgeon: [name], MD;  Location: [place];  Service: Cardiopulmonary;  Laterality: Bilateral;    Family History  Problem Relation Age of Onset   Arthritis Other    Colon cancer Father    Prostate cancer Father    HIV Brother        59   Kidney cancer Brother    Colon cancer Brother    Lung cancer Brother    Other Brother        Mouth Cancer   Ovarian cancer Mother    Uterine cancer Mother    Heart disease Maternal Grandmother    Stroke Maternal Grandmother    Hypertension Maternal Grandmother    Diabetes Maternal Grandmother     Allergies  Allergen Reactions   Prednisone Other (See Comments)    Changed personality    Atrovent Hfa [Ipratropium Bromide Hfa] Other (See Comments)    Pt could not sleep   Cheratussin Ac [Guaifenesin-Codeine]    Codeine Other (See Comments)    Pt takes promethazine with codeine at home   Hydrocodone Itching   Influenza Vaccines Other (See Comments)    Pt reports heart attack after last flu shot   Motrin [Ibuprofen] Other (See Comments)    "Gives false reading in blood"   Oxycodone-Acetaminophen Itching    Current Outpatient Medications on File Prior to Visit  Medication Sig Dispense Refill   Cholecalciferol (VITAMIN D3) 125 MCG (5000 UT) CAPS Take 5,000 Units by mouth daily.     clopidogrel (PLAVIX) 75 MG tablet Take 1 tablet (75 mg total) by mouth daily. 90 tablet 3   diltiazem (CARDIZEM LA) 120 MG 24 hr tablet Take 1 tablet (120 mg total) by mouth daily. 90 tablet 3   fluticasone (FLONASE) 50 MCG/ACT nasal spray Place 2 sprays into both nostrils daily. 16 g 6   furosemide (LASIX) 40 MG tablet Take 1 tablet (40 mg total) by mouth 2 (two) times daily. 180 tablet 1   KLOR-CON M20 20 MEQ tablet TAKE 1 TABLET (20 MEQ TOTAL) BY MOUTH 2 (TWO) TIMES DAILY. WILL TAKE 3 A DAILY ONLY AS NEEDED. 270 tablet 1   Multiple Vitamin (MULTIVITAMIN)  tablet Take 1 tablet by mouth every other day. Gluten free/Vegetarian Multivitamin     Nintedanib (OFEV) 100 MG CAPS Take by mouth.     ondansetron (ZOFRAN-ODT) 4 MG disintegrating tablet TAKE 1 TABLET BY MOUTH EVERY 8 HOURS AS NEEDED FOR NAUSEA AND VOMITING 60 tablet 2   SYNTHROID 112 MCG tablet TAKE 1 TABLET BY MOUTH EVERY DAY BEFORE BREAKFAST 90 tablet 1   vitamin C (ASCORBIC ACID) 500 MG tablet Take 1,000 mg by mouth 2 (two) times daily.  vitamin E 1000 UNIT capsule Take 1,000 Units by mouth daily.     No current facility-administered medications on file prior to visit.    BP 138/75   Pulse 71   Temp 98.1 F (36.7 C)   Resp 20   Ht 5\' 1"  (1.549 m)   Wt 142 lb (64.4 kg)   SpO2 94%   BMI 26.83 kg/m       Objective:   Physical Exam  General Mental Status- Alert. General Appearance- Not in acute distress.   Skin General: Color- Normal Color. Moisture- Normal Moisture.  Neck Carotid Arteries- Normal color. Moisture- Normal Moisture. No carotid bruits. No JVD. Left side posterior neck. Tender to palpation at occipital junction and over occiptal area left side.   Chest and Lung Exam Auscultation: Breath Sounds:-Normal.  Cardiovascular Auscultation:Rythm- Regular. Murmurs & Other Heart Sounds:Auscultation of the heart reveals- No Murmurs.  Abdomen Inspection:-Inspeection Normal. Palpation/Percussion:Note:No mass. Palpation and Percussion of the abdomen reveal- Non Tender, Non Distended + BS, no rebound or guarding.    Neurologic Cranial Nerve exam:- CN III-XII intact(No nystagmus), symmetric smile. Drift Test:- No drift. Romberg Exam:- Negative.  Heal to Toe Gait exam:-Normal. Finger to Nose:- Normal/Intact Strength:- 5/5 equal and symmetric strength both upper and lower extremities.  No direct left side temporal pain.  Skin- no rash on face no vesicles or scabs seen in scalp.  Heent- left frontal maxillary sinus mild tender. Left ear.- upper 1/3 portion tm  faint pink red.  Abdomen-soft, nt, nd+bs, no rebound or guaradring. No organomegaly.      Assessment & Plan:   For new onset chronic daily headache for past 6 months different from former headaches, decided to order CT of head without contrast.  You do have some left occipital area pain with some pain in upper trapezius.  Will prescribe Flexeril low-dose today to see if you respond to this.  If headache resolves with muscle relaxant then with think may have tension headache.  Can use Tylenol over-the-counter as well.  Avoid NSAIDs since on Plavix.  I decided to add sed rate to your blood work today.  If sed rate were to be elevated would likely prescribe tapered low-dose Medrol.   Good neurologic exam today.  If he has worsening headache will motor or sensory deficit type of presentation then recommend ED evaluation.  Asking staff to get prior authorization for stat CT.  Left maxillary sinus pressure and recent imaging showed some mucus in left maxillary sinus area.  Prescribe Augmentin antibiotic.  Recent constipation.  We will add CBC and metabolic panel.  You report that you increase intake of vegetables and that typically resolve your constipation.  If not then you could try half a bottle of magnesium citrate over-the-counter.  Advising this instead of Dulcolax or MiraLAX since she reports that it does not help in the past.  If any persisting constipation with severe pain then recommend ED evaluation.  Follow-up in 7 to 10 days or as needed.  Note I am not in office next week so if symptoms are persisting primary care provider or other provider might be able to seen.  Mackie Pai, PA-C   Time spent with patient today was  41 minutes which consisted of chart revdew, discussing diagnosis, work up ,treatment and documentation.   Rx advisement given regarding Flexeril.  Also placed neurology referral today.

## 2020-10-21 ENCOUNTER — Other Ambulatory Visit: Payer: Self-pay

## 2020-10-21 MED ORDER — BENZONATATE 100 MG PO CAPS: 200.0000 mg | ORAL_CAPSULE | Freq: Three times a day (TID) | ORAL | 2 refills | Status: DC | PRN

## 2020-10-21 NOTE — Telephone Encounter (Signed)
Pt reports that she has IPF and she has a cough on and off and asking for the medication that is pended. You have never filled this for her in the past, but you have talked about it in the last.    Confirmed that pt uses [facility] in [place]. The one on file.

## 2020-10-28 DIAGNOSIS — S99912A Unspecified injury of left ankle, initial encounter: Secondary | ICD-10-CM | POA: Diagnosis not present

## 2020-10-28 DIAGNOSIS — S93402A Sprain of unspecified ligament of left ankle, initial encounter: Secondary | ICD-10-CM | POA: Diagnosis not present

## 2020-10-28 DIAGNOSIS — S93602A Unspecified sprain of left foot, initial encounter: Secondary | ICD-10-CM | POA: Diagnosis not present

## 2020-10-28 DIAGNOSIS — Z9889 Other specified postprocedural states: Secondary | ICD-10-CM | POA: Diagnosis not present

## 2020-10-28 DIAGNOSIS — M79672 Pain in left foot: Secondary | ICD-10-CM | POA: Diagnosis not present

## 2020-10-28 DIAGNOSIS — M7732 Calcaneal spur, left foot: Secondary | ICD-10-CM | POA: Diagnosis not present

## 2020-10-28 DIAGNOSIS — M25572 Pain in left ankle and joints of left foot: Secondary | ICD-10-CM | POA: Diagnosis not present

## 2020-10-31 DIAGNOSIS — G8929 Other chronic pain: Secondary | ICD-10-CM | POA: Diagnosis not present

## 2020-10-31 DIAGNOSIS — J342 Deviated nasal septum: Secondary | ICD-10-CM | POA: Diagnosis not present

## 2020-10-31 DIAGNOSIS — R0981 Nasal congestion: Secondary | ICD-10-CM | POA: Diagnosis not present

## 2020-10-31 DIAGNOSIS — R519 Headache, unspecified: Secondary | ICD-10-CM | POA: Diagnosis not present

## 2020-10-31 DIAGNOSIS — R490 Dysphonia: Secondary | ICD-10-CM | POA: Diagnosis not present

## 2020-11-07 DIAGNOSIS — Z1159 Encounter for screening for other viral diseases: Secondary | ICD-10-CM | POA: Diagnosis not present

## 2020-11-07 DIAGNOSIS — Z20822 Contact with and (suspected) exposure to covid-19: Secondary | ICD-10-CM | POA: Diagnosis not present

## 2020-11-08 ENCOUNTER — Telehealth: Payer: Self-pay

## 2020-11-08 NOTE — Telephone Encounter (Signed)
Opened to mail lab letter

## 2020-11-13 ENCOUNTER — Telehealth: Payer: Self-pay | Admitting: Medical

## 2020-11-13 ENCOUNTER — Other Ambulatory Visit: Payer: Self-pay | Admitting: Medical

## 2020-11-13 MED ORDER — CYCLOBENZAPRINE HCL 5 MG PO TABS: 5.0000 mg | ORAL_TABLET | Freq: Every day | ORAL | 0 refills | Status: DC

## 2020-11-13 NOTE — Telephone Encounter (Signed)
Ok to refill 

## 2020-11-13 NOTE — Telephone Encounter (Signed)
Rx flexeril sent to pt pharmacy. Limited rx. Written for neck pain/ha. See last office visit.

## 2020-11-19 DIAGNOSIS — J342 Deviated nasal septum: Secondary | ICD-10-CM | POA: Diagnosis not present

## 2020-11-19 DIAGNOSIS — J310 Chronic rhinitis: Secondary | ICD-10-CM | POA: Diagnosis not present

## 2020-11-19 DIAGNOSIS — J383 Other diseases of vocal cords: Secondary | ICD-10-CM | POA: Diagnosis not present

## 2020-11-19 DIAGNOSIS — R0981 Nasal congestion: Secondary | ICD-10-CM | POA: Diagnosis not present

## 2020-11-19 DIAGNOSIS — R490 Dysphonia: Secondary | ICD-10-CM | POA: Diagnosis not present

## 2020-11-19 DIAGNOSIS — J3489 Other specified disorders of nose and nasal sinuses: Secondary | ICD-10-CM | POA: Diagnosis not present

## 2020-11-19 DIAGNOSIS — J343 Hypertrophy of nasal turbinates: Secondary | ICD-10-CM | POA: Diagnosis not present

## 2020-11-21 NOTE — Telephone Encounter (Signed)
Left message on machine to call back to schedule follow up with Dr. Etter Sjogren

## 2020-11-26 ENCOUNTER — Encounter: Payer: Self-pay | Admitting: Family Medicine

## 2020-11-26 ENCOUNTER — Ambulatory Visit (INDEPENDENT_AMBULATORY_CARE_PROVIDER_SITE_OTHER): Payer: Medicare Other | Admitting: Family Medicine

## 2020-11-26 ENCOUNTER — Other Ambulatory Visit: Payer: Self-pay

## 2020-11-26 VITALS — BP 110/80 | HR 83 | Temp 98.6°F | Resp 18 | Ht 61.0 in | Wt 139.2 lb

## 2020-11-26 DIAGNOSIS — Z8673 Personal history of transient ischemic attack (TIA), and cerebral infarction without residual deficits: Secondary | ICD-10-CM

## 2020-11-26 DIAGNOSIS — R110 Nausea: Secondary | ICD-10-CM

## 2020-11-26 DIAGNOSIS — I10 Essential (primary) hypertension: Secondary | ICD-10-CM

## 2020-11-26 DIAGNOSIS — J4541 Moderate persistent asthma with (acute) exacerbation: Secondary | ICD-10-CM

## 2020-11-26 DIAGNOSIS — E876 Hypokalemia: Secondary | ICD-10-CM

## 2020-11-26 DIAGNOSIS — J849 Interstitial pulmonary disease, unspecified: Secondary | ICD-10-CM | POA: Diagnosis not present

## 2020-11-26 DIAGNOSIS — E039 Hypothyroidism, unspecified: Secondary | ICD-10-CM

## 2020-11-26 DIAGNOSIS — J0141 Acute recurrent pansinusitis: Secondary | ICD-10-CM | POA: Diagnosis not present

## 2020-11-26 DIAGNOSIS — I2510 Atherosclerotic heart disease of native coronary artery without angina pectoris: Secondary | ICD-10-CM | POA: Diagnosis not present

## 2020-11-26 MED ORDER — CLOPIDOGREL BISULFATE 75 MG PO TABS: 75.0000 mg | ORAL_TABLET | Freq: Every day | ORAL | 3 refills | Status: DC

## 2020-11-26 MED ORDER — FUROSEMIDE 40 MG PO TABS: 40.0000 mg | ORAL_TABLET | Freq: Two times a day (BID) | ORAL | 1 refills | Status: DC

## 2020-11-26 MED ORDER — ONDANSETRON 4 MG PO TBDP: ORAL_TABLET | ORAL | 2 refills | Status: DC

## 2020-11-26 MED ORDER — CARDIZEM LA 120 MG PO TB24: 120.0000 mg | ORAL_TABLET | Freq: Every day | ORAL | 3 refills | Status: DC

## 2020-11-26 MED ORDER — KLOR-CON M20 20 MEQ PO TBCR: 20.0000 meq | EXTENDED_RELEASE_TABLET | Freq: Two times a day (BID) | ORAL | 1 refills | Status: DC

## 2020-11-26 MED ORDER — FLUTICASONE PROPIONATE 50 MCG/ACT NA SUSP
2.0000 | Freq: Every day | NASAL | 6 refills | Status: DC
Start: 2020-11-26 — End: 2021-07-18

## 2020-11-26 MED ORDER — SYNTHROID 112 MCG PO TABS
ORAL_TABLET | ORAL | 1 refills | Status: DC
Start: 2020-11-26 — End: 2021-08-28

## 2020-11-26 NOTE — Assessment & Plan Note (Signed)
Per pulmonary 

## 2020-11-26 NOTE — Assessment & Plan Note (Signed)
Stable F/u pulm

## 2020-11-26 NOTE — Progress Notes (Addendum)
Subjective:   By signing my name below, I, [name], attest that this documentation has been prepared under the direction and in the presence of Dr. Roma Schanz, DO. 11/26/2020    Patient ID: Alexandria [name], female    DOB: [birth year], 75 y.o.   MRN: [id number]  Chief Complaint  Patient presents with   Headache   Constipation   Follow-up    HPI Patient is in today for a office visit.  She is requesting a refill for 4 mg Zofran-odt daily PO, 112 mcg synthroid daily PO, 75 mg Plavix daily PO, 120 mg diltiazem daily PO, Flonase nasal spray, 40 mg lasix daily PO , 20 meq klor-con m20 2x daily PO,  She went to [facility] for shortness of breath and reports having a deviated septum on the left side. She is seeing another doctor to help manage her voice to help her continue singing.  She continues taking 75 mg Plavix due to her history of strokes. She reports no new issues while taking it but prefers to stop taking it.   Past Medical History:  Diagnosis Date   Arthritis    Asthmatic bronchitis with acute exacerbation 02/06/2015   Bronchial pneumonia    Colon polyp    Diverticulitis    DVT (deep venous thrombosis) (HCC)    lower extremity   H/O cardiac catheterization 2004   Normal coronary arteries   Heart murmur    History of stress test 05/21/2011   Hx of echocardiogram 01/27/2010   Normal Ef 55% the transmitral spectral doppler flow pattern is normal for age. the left ventricular wall motion is normal   IPF (idiopathic pulmonary fibrosis) ([place]) 01/2018   Migraines    Pancreatitis    Thyroid disease    TIA (transient ischemic attack)    8 years ago    Past Surgical History:  Procedure Laterality Date   BREAST BIOPSY     [facility]   CARDIAC CATHETERIZATION     10/2013   CATARACT EXTRACTION Right 03/22/2018   LEFT HEART CATHETERIZATION WITH CORONARY ANGIOGRAM N/A 10/25/2013   Procedure: LEFT HEART CATHETERIZATION WITH CORONARY ANGIOGRAM;  Surgeon: Troy Sine, MD;  Location: [facility] CATH LAB;  Service: Cardiovascular;  Laterality: N/A;   NECK SURGERY     x's 2   TOTAL KNEE ARTHROPLASTY     Bilateral x's 2   VAGINAL HYSTERECTOMY  10/17/1998   [name]   VIDEO BRONCHOSCOPY Bilateral 01/24/2018   Procedure: VIDEO BRONCHOSCOPY WITH FLUORO;  Surgeon: [name], MD;  Location: [facility];  Service: Cardiopulmonary;  Laterality: Bilateral;    Family History  Problem Relation Age of Onset   Arthritis Other    Colon cancer Father    Prostate cancer Father    HIV Brother        1982   Kidney cancer Brother    Colon cancer Brother    Lung cancer Brother    Other Brother        Mouth Cancer   Ovarian cancer Mother    Uterine cancer Mother    Heart disease Maternal Grandmother    Stroke Maternal Grandmother    Hypertension Maternal Grandmother    Diabetes Maternal Grandmother     Social History   Socioeconomic History   Marital status: Married    Spouse name: [name]   Number of children: 3   Years of education: [employer]   Highest education level: Not on file  Occupational History  Occupation: DISABLED    Employer: UNEMPLOYED  Tobacco Use   Smoking status: Never   Smokeless tobacco: Never  Vaping Use   Vaping Use: Never used  Substance and Sexual Activity   Alcohol use: No    Alcohol/week: 0.0 standard drinks   Drug use: No   Sexual activity: Not on file  Other Topics Concern   Not on file  Social History Narrative   Lives with husband [name]   Caffeine use: coffee (2 cups per day)   Mostly right-handed   Social Determinants of Health   Financial Resource Strain: Not on file  Food Insecurity: Not on file  Transportation Needs: Not on file  Physical Activity: Not on file  Stress: Not on file  Social Connections: Not on file  Intimate Partner Violence: Not on file    Outpatient Medications Prior to Visit  Medication Sig Dispense Refill   benzonatate (TESSALON) 100 MG capsule Take 2 capsules (200 mg  total) by mouth 3 (three) times daily as needed for cough. 60 capsule 2   Cholecalciferol (VITAMIN D3) 125 MCG (5000 UT) CAPS Take 5,000 Units by mouth daily.     Multiple Vitamin (MULTIVITAMIN) tablet Take 1 tablet by mouth every other day. Gluten free/Vegetarian Multivitamin     Nintedanib (OFEV) 100 MG CAPS Take by mouth.     vitamin C (ASCORBIC ACID) 500 MG tablet Take 1,000 mg by mouth 2 (two) times daily.      vitamin E 1000 UNIT capsule Take 1,000 Units by mouth daily.     clopidogrel (PLAVIX) 75 MG tablet Take 1 tablet (75 mg total) by mouth daily. 90 tablet 3   diltiazem (CARDIZEM LA) 120 MG 24 hr tablet Take 1 tablet (120 mg total) by mouth daily. 90 tablet 3   fluticasone (FLONASE) 50 MCG/ACT nasal spray Place 2 sprays into both nostrils daily. 16 g 6   furosemide (LASIX) 40 MG tablet Take 1 tablet (40 mg total) by mouth 2 (two) times daily. 180 tablet 1   KLOR-CON M20 20 MEQ tablet TAKE 1 TABLET (20 MEQ TOTAL) BY MOUTH 2 (TWO) TIMES DAILY. WILL TAKE 3 A DAILY ONLY AS NEEDED. 270 tablet 1   ondansetron (ZOFRAN-ODT) 4 MG disintegrating tablet TAKE 1 TABLET BY MOUTH EVERY 8 HOURS AS NEEDED FOR NAUSEA AND VOMITING 60 tablet 2   SYNTHROID 112 MCG tablet TAKE 1 TABLET BY MOUTH EVERY DAY BEFORE BREAKFAST 90 tablet 1   cyclobenzaprine (FLEXERIL) 5 MG tablet Take 1 tablet (5 mg total) by mouth at bedtime. (Patient not taking: Reported on 11/26/2020) 5 tablet 0   amoxicillin-clavulanate (AUGMENTIN) 875-125 MG tablet Take 1 tablet by mouth 2 (two) times daily. (Patient not taking: Reported on 11/26/2020) 20 tablet 0   No facility-administered medications prior to visit.    Allergies  Allergen Reactions   Prednisone Other (See Comments)    Changed personality    Atrovent Hfa [Ipratropium Bromide Hfa] Other (See Comments)    Pt could not sleep   Cheratussin Ac [Guaifenesin-Codeine]    Codeine Other (See Comments)    Pt takes promethazine with codeine at home   Hydrocodone Itching    Influenza Vaccines Other (See Comments)    Pt reports heart attack after last flu shot   Motrin [Ibuprofen] Other (See Comments)    "Gives false reading in blood"   Oxycodone-Acetaminophen Itching    Review of Systems  Constitutional:  Negative for chills, fever and malaise/fatigue.  HENT:  Negative for congestion and  hearing loss.   Eyes:  Negative for blurred vision and discharge.  Respiratory:  Negative for cough, sputum production and shortness of breath.   Cardiovascular:  Negative for chest pain, palpitations and leg swelling.  Gastrointestinal:  Negative for abdominal pain, blood in stool, constipation, diarrhea, heartburn, nausea and vomiting.  Genitourinary:  Negative for dysuria, frequency, hematuria and urgency.  Musculoskeletal:  Negative for back pain, falls and myalgias.  Skin:  Negative for rash.  Neurological:  Negative for dizziness, sensory change, loss of consciousness, weakness and headaches.  Endo/Heme/Allergies:  Negative for environmental allergies. Does not bruise/bleed easily.  Psychiatric/Behavioral:  Negative for depression and suicidal ideas. The patient is not nervous/anxious and does not have insomnia.       Objective:    Physical Exam Vitals and nursing note reviewed.  Constitutional:      General: She is not in acute distress.    Appearance: Normal appearance. She is well-developed. She is not ill-appearing.  HENT:     Head: Normocephalic and atraumatic.     Right Ear: External ear normal.     Left Ear: External ear normal.  Eyes:     Extraocular Movements: Extraocular movements intact.     Conjunctiva/sclera: Conjunctivae normal.     Pupils: Pupils are equal, round, and reactive to light.  Neck:     Thyroid: No thyromegaly.     Vascular: No carotid bruit or JVD.  Cardiovascular:     Rate and Rhythm: Normal rate and regular rhythm.     Heart sounds: Normal heart sounds. No murmur heard.   No gallop.  Pulmonary:     Effort: Pulmonary effort  is normal. No respiratory distress.     Breath sounds: Normal breath sounds. No wheezing or rales.  Chest:     Chest wall: No tenderness.  Musculoskeletal:     Cervical back: Normal range of motion and neck supple.  Skin:    General: Skin is warm and dry.  Neurological:     Mental Status: She is alert and oriented to person, place, and time.  Psychiatric:        Mood and Affect: Mood normal.        Behavior: Behavior normal.        Judgment: Judgment normal.    BP 110/80 (BP Location: Left Arm, Patient Position: Sitting, Cuff Size: Normal)   Pulse 83   Temp 98.6 F (37 C) (Oral)   Resp 18   Ht _0  (1.549 m)   Wt 139 lb 3.2 oz (63.1 kg)   SpO2 97%   BMI 26.30 kg/m  Wt Readings from Last 3 Encounters:  11/26/20 139 lb 3.2 oz (63.1 kg)  10/18/20 142 lb (64.4 kg)  07/17/20 134 lb (60.8 kg)    Diabetic Foot Exam - Simple   No data filed    Lab Results  Component Value Date   WBC 6.0 10/18/2020   HGB 13.0 10/18/2020   HCT 38.1 10/18/2020   PLT 228.0 10/18/2020   GLUCOSE 90 10/18/2020   CHOL 196 03/18/2020   TRIG 91.0 03/18/2020   HDL 58.70 03/18/2020   LDLCALC 119 (H) 03/18/2020   ALT 20 10/18/2020   AST 24 10/18/2020   NA 141 10/18/2020   K 3.8 10/18/2020   CL 102 10/18/2020   CREATININE 0.84 10/18/2020   BUN 13 10/18/2020   CO2 33 (H) 10/18/2020   TSH 1.460 05/24/2020   INR 1.1 (H) 05/07/2015   HGBA1C 5.4 03/18/2020  Lab Results  Component Value Date   TSH 1.460 05/24/2020   Lab Results  Component Value Date   WBC 6.0 10/18/2020   HGB 13.0 10/18/2020   HCT 38.1 10/18/2020   MCV 97.3 10/18/2020   PLT 228.0 10/18/2020   Lab Results  Component Value Date   NA 141 10/18/2020   K 3.8 10/18/2020   CO2 33 (H) 10/18/2020   GLUCOSE 90 10/18/2020   BUN 13 10/18/2020   CREATININE 0.84 10/18/2020   BILITOT 0.5 10/18/2020   ALKPHOS 71 10/18/2020   AST 24 10/18/2020   ALT 20 10/18/2020   PROT 6.5 10/18/2020   ALBUMIN 3.8 10/18/2020   CALCIUM 9.3  10/18/2020   ANIONGAP 12 11/13/2019   EGFR 78 07/04/2020   GFR 67.78 10/18/2020   Lab Results  Component Value Date   CHOL 196 03/18/2020   Lab Results  Component Value Date   HDL 58.70 03/18/2020   Lab Results  Component Value Date   LDLCALC 119 (H) 03/18/2020   Lab Results  Component Value Date   TRIG 91.0 03/18/2020   Lab Results  Component Value Date   CHOLHDL 3 03/18/2020   Lab Results  Component Value Date   HGBA1C 5.4 03/18/2020       Assessment & Plan:   Problem List Items Addressed This Visit       Unprioritized   Nausea   Relevant Medications   ondansetron (ZOFRAN-ODT) 4 MG disintegrating tablet   Pansinusitis   Relevant Medications   fluticasone (FLONASE) 50 MCG/ACT nasal spray   Arteriosclerotic cardiovascular disease (ASCVD)    On plavix Sees cardiology      Relevant Medications   diltiazem (CARDIZEM LA) 120 MG 24 hr tablet   furosemide (LASIX) 40 MG tablet   Asthmatic bronchitis with acute exacerbation    Stable F/u pulm      Essential hypertension    Well controlled, no changes to meds. Encouraged heart healthy diet such as the DASH diet and exercise as tolerated.       Relevant Medications   diltiazem (CARDIZEM LA) 120 MG 24 hr tablet   furosemide (LASIX) 40 MG tablet   History of stroke without residual deficits    On plavix Has neuro f/u at [facility]      Hypothyroidism - Primary    Stable Check labs next office visit Lab Results  Component Value Date   TSH 1.460 05/24/2020         Relevant Medications   SYNTHROID 112 MCG tablet   ILD (interstitial lung disease) ([place])    Per pulmonary      Other Visit Diagnoses     Hypokalemia       Relevant Medications   KLOR-CON M20 20 MEQ tablet   Primary hypertension       Relevant Medications   diltiazem (CARDIZEM LA) 120 MG 24 hr tablet   furosemide (LASIX) 40 MG tablet        Meds ordered this encounter  Medications   clopidogrel (PLAVIX) 75 MG tablet    Sig:  Take 1 tablet (75 mg total) by mouth daily.    Dispense:  90 tablet    Refill:  3   diltiazem (CARDIZEM LA) 120 MG 24 hr tablet    Sig: Take 1 tablet (120 mg total) by mouth daily.    Dispense:  90 tablet    Refill:  3   fluticasone (FLONASE) 50 MCG/ACT nasal spray    Sig: Place 2 sprays  into both nostrils daily.    Dispense:  16 g    Refill:  6   furosemide (LASIX) 40 MG tablet    Sig: Take 1 tablet (40 mg total) by mouth 2 (two) times daily.    Dispense:  180 tablet    Refill:  1   KLOR-CON M20 20 MEQ tablet    Sig: Take 1 tablet (20 mEq total) by mouth 2 (two) times daily. Will take 3 a daily only as needed.    Dispense:  270 tablet    Refill:  1   ondansetron (ZOFRAN-ODT) 4 MG disintegrating tablet    Sig: TAKE 1 TABLET BY MOUTH EVERY 8 HOURS AS NEEDED FOR NAUSEA AND VOMITING    Dispense:  60 tablet    Refill:  2    DX Code Needed  .   SYNTHROID 112 MCG tablet    Sig: 1 po qd    Dispense:  90 tablet    Refill:  1    I, Dr. Roma Schanz, DO, personally preformed the services described in this documentation.  All medical record entries made by the scribe were at my direction and in my presence.  I have reviewed the chart and discharge instructions (if applicable) and agree that the record reflects my personal performance and is accurate and complete. 11/26/2020   I,[name],acting as a scribe for [name], DO.,have documented all relevant documentation on the behalf of [name], DO,as directed by  [name], DO while in the presence of [name], DO.   [name], DO

## 2020-11-26 NOTE — Assessment & Plan Note (Signed)
On plavix Sees cardiology

## 2020-11-26 NOTE — Assessment & Plan Note (Signed)
Well controlled, no changes to meds. Encouraged heart healthy diet such as the DASH diet and exercise as tolerated.  °

## 2020-11-26 NOTE — Patient Instructions (Signed)
Thyroid-Stimulating Hormone Test Why am I having this test? The thyroid is a gland in the lower front of the neck. It makes hormones that affect many body parts and systems, including the system that affects how quickly the body burns fuel for energy (metabolism). The pituitary gland is located just below the brain, behind the eyes and nasal passages. It helps maintain thyroid hormone levels and thyroid gland function. You may have a thyroid-stimulating hormone (TSH) test if you have possible symptoms of abnormal thyroid hormone levels. This test can help your health care provider: Diagnose a disorder of the thyroid gland or pituitary gland. Manage your condition and treatment if you have an underactive thyroid (hypothyroidism) or an overactive thyroid (hyperthyroidism). Newborn babies may have this test done to screen for hypothyroidism that is present at birth (congenital). What is being tested? This test measures the amount of TSH in your blood. TSH may also be called thyrotropin. When the thyroid does not make enough hormones, the pituitary gland releases TSH into the bloodstream to stimulate the thyroid gland to make more hormones. What kind of sample is taken?   A blood sample is required for this test. It is usually collected by inserting a needle into a blood vessel. For newborns, a small amount of blood may be collected from the umbilical cord, or by using a small needle to prick the baby's heel (heel stick). Tell a health care provider about: All medicines you are taking, including vitamins, herbs, eye drops, creams, and over-the-counter medicines. Any blood disorders you have. Any surgeries you have had. Any medical conditions you have. Whether you are pregnant or may be pregnant. How are the results reported? Your test results will be reported as a value that indicates how much TSH is in your blood. Your health care provider will compare your results to normal ranges that were  established after testing a large group of people (reference ranges). Reference ranges may vary among labs and hospitals. For this test, common reference ranges are: Adult: 2-10 microunits/mL or 2-10 milliunits/L. Newborn: Heel stick: 3-18 microunits/mL or 3-18 milliunits/L. Umbilical cord: 3-12 microunits/mL or 3-12 milliunits/L. What do the results mean? Results that are within the reference range are considered normal. This means that you have a normal amount of TSH in your blood. Results that are higher than the reference range mean that your TSH levels are too high. This may mean: Your thyroid gland is not making enough thyroid hormones. Your thyroid medicine dosage is too low. You have a tumor on your pituitary gland. This is rare. Results that are lower than the reference range mean that your TSH levels are too low. This may be caused by hyperthyroidism or by a problem with the pituitary gland function. Talk with your health care provider about what your results mean. Questions to ask your health care provider Ask your health care provider, or the department that is doing the test: When will my results be ready? How will I get my results? What are my treatment options? What other tests do I need? What are my next steps? Summary You may have a thyroid-stimulating hormone (TSH) test if you have possible symptoms of abnormal thyroid hormone levels. The thyroid is a gland in the lower front of the neck. It makes hormones that affect many body parts and systems. The pituitary gland is located just below the brain, behind the eyes and nasal passages. It helps maintain thyroid hormone levels and thyroid gland function. This test measures the   amount of TSH in your blood. TSH is made by the pituitary gland. It may also be called thyrotropin. This information is not intended to replace advice given to you by your health care provider. Make sure you discuss any questions you have with your  health care provider. Document Revised: 12/07/2019 Document Reviewed: 12/07/2019 Elsevier Patient Education  2022 [employer]  

## 2020-11-26 NOTE — Assessment & Plan Note (Signed)
Stable Check labs next office visit Lab Results  Component Value Date   TSH 1.460 05/24/2020

## 2020-11-26 NOTE — Assessment & Plan Note (Signed)
On plavix Has neuro f/u at [facility]

## 2020-12-02 ENCOUNTER — Other Ambulatory Visit: Payer: Self-pay | Admitting: Family Medicine

## 2020-12-02 ENCOUNTER — Encounter: Payer: Self-pay | Admitting: Family Medicine

## 2020-12-02 ENCOUNTER — Telehealth (INDEPENDENT_AMBULATORY_CARE_PROVIDER_SITE_OTHER): Payer: Medicare Other | Admitting: Family Medicine

## 2020-12-02 ENCOUNTER — Telehealth: Payer: Self-pay

## 2020-12-02 ENCOUNTER — Other Ambulatory Visit: Payer: Self-pay

## 2020-12-02 DIAGNOSIS — I2510 Atherosclerotic heart disease of native coronary artery without angina pectoris: Secondary | ICD-10-CM | POA: Diagnosis not present

## 2020-12-02 DIAGNOSIS — J0140 Acute pansinusitis, unspecified: Secondary | ICD-10-CM

## 2020-12-02 MED ORDER — ERYTHROMYCIN BASE 500 MG PO TABS: 500.0000 mg | ORAL_TABLET | Freq: Two times a day (BID) | ORAL | 0 refills | Status: DC

## 2020-12-02 MED ORDER — CEFDINIR 300 MG PO CAPS: 300.0000 mg | ORAL_CAPSULE | Freq: Two times a day (BID) | ORAL | 0 refills | Status: DC

## 2020-12-02 NOTE — Telephone Encounter (Signed)
Pt called stating she was prescribed Cefdinir today, but she normally gets Erythromycin for her sinus issues and is requesting that be sent in to her pharmacy.

## 2020-12-02 NOTE — Progress Notes (Signed)
MyChart Video Visit    Virtual Visit via Video Note   This visit type was conducted due to national recommendations for restrictions regarding the COVID-19 Pandemic (e.g. social distancing) in an effort to limit this patient's exposure and mitigate transmission in our community. This patient is at least at moderate risk for complications without adequate follow up. This format is felt to be most appropriate for this patient at this time. Physical exam was limited by quality of the video and audio technology used for the visit. Alexandria [name] was able to get the patient set up on a video visit.  Patient location: home alone  Patient and provider in visit Provider location: Office  I discussed the limitations of evaluation and management by telemedicine and the availability of in person appointments. The patient expressed understanding and agreed to proceed.  Visit Date: 12/02/2020  Today's healthcare provider: [name], DO     Subjective:    Patient ID: Alexandria [name], female    DOB: 09/16/1945, 75 y.o.   MRN: [id number]  Chief Complaint  Patient presents with   Sinus Problem     Patient is in today for chills / fever and congestion and sinus pressure/ headache.  No otc meds    covid neg x 2   + cough-- productive.  Past Medical History:  Diagnosis Date   Arthritis    Asthmatic bronchitis with acute exacerbation 02/06/2015   Bronchial pneumonia    Colon polyp    Diverticulitis    DVT (deep venous thrombosis) (HCC)    lower extremity   H/O cardiac catheterization 2004   Normal coronary arteries   Heart murmur    History of stress test 05/21/2011   Hx of echocardiogram 01/27/2010   Normal Ef 55% the transmitral spectral doppler flow pattern is normal for age. the left ventricular wall motion is normal   IPF (idiopathic pulmonary fibrosis) ([place]) 01/2018   Migraines    Pancreatitis    Thyroid disease    TIA (transient ischemic attack)    8 years  ago    Past Surgical History:  Procedure Laterality Date   BREAST BIOPSY     [facility]   CARDIAC CATHETERIZATION     10/2013   CATARACT EXTRACTION Right 03/22/2018   LEFT HEART CATHETERIZATION WITH CORONARY ANGIOGRAM N/A 10/25/2013   Procedure: LEFT HEART CATHETERIZATION WITH CORONARY ANGIOGRAM;  Surgeon: Troy Sine, MD;  Location: [facility] CATH LAB;  Service: Cardiovascular;  Laterality: N/A;   NECK SURGERY     x's 2   TOTAL KNEE ARTHROPLASTY     Bilateral x's 2   VAGINAL HYSTERECTOMY  10/17/1998   [name]   VIDEO BRONCHOSCOPY Bilateral 01/24/2018   Procedure: VIDEO BRONCHOSCOPY WITH FLUORO;  Surgeon: [name], MD;  Location: [facility];  Service: Cardiopulmonary;  Laterality: Bilateral;    Family History  Problem Relation Age of Onset   Arthritis Other    Colon cancer Father    Prostate cancer Father    HIV Brother        1982   Kidney cancer Brother    Colon cancer Brother    Lung cancer Brother    Other Brother        Mouth Cancer   Ovarian cancer Mother    Uterine cancer Mother    Heart disease Maternal Grandmother    Stroke Maternal Grandmother    Hypertension Maternal Grandmother    Diabetes Maternal Grandmother  Social History   Socioeconomic History   Marital status: Married    Spouse name: [name]   Number of children: 3   Years of education: Jr college   Highest education level: Not on file  Occupational History   Occupation: DISABLED    Employer: UNEMPLOYED  Tobacco Use   Smoking status: Never   Smokeless tobacco: Never  Vaping Use   Vaping Use: Never used  Substance and Sexual Activity   Alcohol use: No    Alcohol/week: 0.0 standard drinks   Drug use: No   Sexual activity: Not on file  Other Topics Concern   Not on file  Social History Narrative   Lives with husband [name]   Caffeine use: coffee (2 cups per day)   Mostly right-handed   Social Determinants of Health   Financial Resource Strain: Not on file  Food Insecurity:  Not on file  Transportation Needs: Not on file  Physical Activity: Not on file  Stress: Not on file  Social Connections: Not on file  Intimate Partner Violence: Not on file    Outpatient Medications Prior to Visit  Medication Sig Dispense Refill   benzonatate (TESSALON) 100 MG capsule Take 2 capsules (200 mg total) by mouth 3 (three) times daily as needed for cough. 60 capsule 2   Cholecalciferol (VITAMIN D3) 125 MCG (5000 UT) CAPS Take 5,000 Units by mouth daily.     clopidogrel (PLAVIX) 75 MG tablet Take 1 tablet (75 mg total) by mouth daily. 90 tablet 3   diltiazem (CARDIZEM LA) 120 MG 24 hr tablet Take 1 tablet (120 mg total) by mouth daily. 90 tablet 3   fluticasone (FLONASE) 50 MCG/ACT nasal spray Place 2 sprays into both nostrils daily. 16 g 6   furosemide (LASIX) 40 MG tablet Take 1 tablet (40 mg total) by mouth 2 (two) times daily. 180 tablet 1   KLOR-CON M20 20 MEQ tablet Take 1 tablet (20 mEq total) by mouth 2 (two) times daily. Will take 3 a daily only as needed. 270 tablet 1   Multiple Vitamin (MULTIVITAMIN) tablet Take 1 tablet by mouth every other day. Gluten free/Vegetarian Multivitamin     Nintedanib (OFEV) 100 MG CAPS Take by mouth.     ondansetron (ZOFRAN-ODT) 4 MG disintegrating tablet TAKE 1 TABLET BY MOUTH EVERY 8 HOURS AS NEEDED FOR NAUSEA AND VOMITING 60 tablet 2   SYNTHROID 112 MCG tablet 1 po qd 90 tablet 1   vitamin C (ASCORBIC ACID) 500 MG tablet Take 1,000 mg by mouth 2 (two) times daily.      vitamin E 1000 UNIT capsule Take 1,000 Units by mouth daily.     cyclobenzaprine (FLEXERIL) 5 MG tablet Take 1 tablet (5 mg total) by mouth at bedtime. (Patient not taking: No sig reported) 5 tablet 0   No facility-administered medications prior to visit.    Allergies  Allergen Reactions   Prednisone Other (See Comments)    Changed personality    Atrovent Hfa [Ipratropium Bromide Hfa] Other (See Comments)    Pt could not sleep   Cheratussin Ac [Guaifenesin-Codeine]     Codeine Other (See Comments)    Pt takes promethazine with codeine at home   Hydrocodone Itching   Influenza Vaccines Other (See Comments)    Pt reports heart attack after last flu shot   Motrin [Ibuprofen] Other (See Comments)    "Gives false reading in blood"   Oxycodone-Acetaminophen Itching    Review of Systems  Constitutional:  Positive  for chills and fever. Negative for malaise/fatigue.  HENT:  Positive for congestion and sinus pain. Negative for sore throat.   Eyes:  Negative for blurred vision.  Respiratory:  Positive for cough and sputum production. Negative for shortness of breath.   Cardiovascular:  Negative for chest pain, palpitations and leg swelling.  Gastrointestinal:  Negative for abdominal pain, blood in stool and nausea.  Genitourinary:  Negative for dysuria and frequency.  Musculoskeletal:  Negative for falls.  Skin:  Negative for rash.  Neurological:  Negative for dizziness, loss of consciousness and headaches.  Endo/Heme/Allergies:  Negative for environmental allergies.  Psychiatric/Behavioral:  Negative for depression. The patient is not nervous/anxious.       Objective:    Physical Exam Vitals and nursing note reviewed.  Constitutional:      Appearance: She is not diaphoretic.  HENT:     Nose: Mucosal edema present. No nasal deformity.     Right Sinus: Maxillary sinus tenderness and frontal sinus tenderness present.     Left Sinus: Maxillary sinus tenderness and frontal sinus tenderness present.  Pulmonary:     Effort: Pulmonary effort is normal.  Neurological:     General: No focal deficit present.     Mental Status: She is alert and oriented to person, place, and time.    There were no vitals taken for this visit. Wt Readings from Last 3 Encounters:  11/26/20 139 lb 3.2 oz (63.1 kg)  10/18/20 142 lb (64.4 kg)  07/17/20 134 lb (60.8 kg)    Diabetic Foot Exam - Simple   No data filed    Lab Results  Component Value Date   WBC 6.0  10/18/2020   HGB 13.0 10/18/2020   HCT 38.1 10/18/2020   PLT 228.0 10/18/2020   GLUCOSE 90 10/18/2020   CHOL 196 03/18/2020   TRIG 91.0 03/18/2020   HDL 58.70 03/18/2020   LDLCALC 119 (H) 03/18/2020   ALT 20 10/18/2020   AST 24 10/18/2020   NA 141 10/18/2020   K 3.8 10/18/2020   CL 102 10/18/2020   CREATININE 0.84 10/18/2020   BUN 13 10/18/2020   CO2 33 (H) 10/18/2020   TSH 1.460 05/24/2020   INR 1.1 (H) 05/07/2015   HGBA1C 5.4 03/18/2020    Lab Results  Component Value Date   TSH 1.460 05/24/2020   Lab Results  Component Value Date   WBC 6.0 10/18/2020   HGB 13.0 10/18/2020   HCT 38.1 10/18/2020   MCV 97.3 10/18/2020   PLT 228.0 10/18/2020   Lab Results  Component Value Date   NA 141 10/18/2020   K 3.8 10/18/2020   CO2 33 (H) 10/18/2020   GLUCOSE 90 10/18/2020   BUN 13 10/18/2020   CREATININE 0.84 10/18/2020   BILITOT 0.5 10/18/2020   ALKPHOS 71 10/18/2020   AST 24 10/18/2020   ALT 20 10/18/2020   PROT 6.5 10/18/2020   ALBUMIN 3.8 10/18/2020   CALCIUM 9.3 10/18/2020   ANIONGAP 12 11/13/2019   EGFR 78 07/04/2020   GFR 67.78 10/18/2020   Lab Results  Component Value Date   CHOL 196 03/18/2020   Lab Results  Component Value Date   HDL 58.70 03/18/2020   Lab Results  Component Value Date   LDLCALC 119 (H) 03/18/2020   Lab Results  Component Value Date   TRIG 91.0 03/18/2020   Lab Results  Component Value Date   CHOLHDL 3 03/18/2020   Lab Results  Component Value Date   HGBA1C 5.4  03/18/2020       Assessment & Plan:   Problem List Items Addressed This Visit       Unprioritized   Pansinusitis - Primary   Relevant Medications   cefdinir (OMNICEF) 300 MG capsule    I am having Alexandria Rover "[place]" start on cefdinir. I am also having her maintain her multivitamin, vitamin C, vitamin E, Vitamin D3, Nintedanib, benzonatate, cyclobenzaprine, clopidogrel, Cardizem LA, fluticasone, furosemide, Klor-Con M20, ondansetron, and  Synthroid.  Meds ordered this encounter  Medications   cefdinir (OMNICEF) 300 MG capsule    Sig: Take 1 capsule (300 mg total) by mouth 2 (two) times daily.    Dispense:  20 capsule    Refill:  0    I discussed the assessment and treatment plan with the patient. The patient was provided an opportunity to ask questions and all were answered. The patient agreed with the plan and demonstrated an understanding of the instructions.   The patient was advised to call back or seek an in-person evaluation if the symptoms worsen or if the condition fails to improve as anticipated.    [name], DO [place] [facility] [phone] (phone) [phone] (fax)  [place]

## 2020-12-04 NOTE — Telephone Encounter (Signed)
Pt called. LVM to confirm she picked up new Rx

## 2020-12-12 DIAGNOSIS — R490 Dysphonia: Secondary | ICD-10-CM | POA: Diagnosis not present

## 2020-12-12 DIAGNOSIS — J383 Other diseases of vocal cords: Secondary | ICD-10-CM | POA: Diagnosis not present

## 2020-12-20 DIAGNOSIS — J383 Other diseases of vocal cords: Secondary | ICD-10-CM | POA: Diagnosis not present

## 2020-12-20 DIAGNOSIS — R490 Dysphonia: Secondary | ICD-10-CM | POA: Diagnosis not present

## 2020-12-25 DIAGNOSIS — J383 Other diseases of vocal cords: Secondary | ICD-10-CM | POA: Diagnosis not present

## 2020-12-25 DIAGNOSIS — R490 Dysphonia: Secondary | ICD-10-CM | POA: Diagnosis not present

## 2021-01-14 ENCOUNTER — Encounter: Payer: Self-pay | Admitting: Internal Medicine

## 2021-01-14 ENCOUNTER — Telehealth (INDEPENDENT_AMBULATORY_CARE_PROVIDER_SITE_OTHER): Payer: Medicare Other | Admitting: Internal Medicine

## 2021-01-14 ENCOUNTER — Ambulatory Visit: Payer: Medicare Other | Admitting: Neurology

## 2021-01-14 VITALS — HR 87 | Ht 61.0 in | Wt 131.0 lb

## 2021-01-14 DIAGNOSIS — J019 Acute sinusitis, unspecified: Secondary | ICD-10-CM

## 2021-01-14 MED ORDER — AMOXICILLIN 875 MG PO TABS: 875.0000 mg | ORAL_TABLET | Freq: Two times a day (BID) | ORAL | 0 refills | Status: DC

## 2021-01-14 NOTE — Progress Notes (Signed)
Subjective:    Patient ID: Alexandria [name], female    DOB: 11-Feb-1946, 75 y.o.   MRN: [id number]  DOS:  01/14/2021 Type of visit - description: Virtual Visit via Telephone    I connected with above mentioned patient  by telephone and verified that I am speaking with the correct person using two identifiers.  THIS ENCOUNTER IS A VIRTUAL VISIT DUE TO COVID-19 - PATIENT WAS NOT SEEN IN THE OFFICE. PATIENT HAS CONSENTED TO VIRTUAL VISIT / TELEMEDICINE VISIT   Location of patient: home  Location of provider: office  Persons participating in the virtual visit: patient, provider   I discussed the limitations, risks, security and privacy concerns of performing an evaluation and management service by telephone and the availability of in person appointments. I also discussed with the patient that there may be a patient responsible charge related to this service. The patient expressed understanding and agreed to proceed.  Acute Symptoms a started approximately a week ago: Sinus congestion, pain behind the eyes worse on the left, pain behind the cheeks. "I know his sinusitis and I need antibiotics".  Requests amoxicillin Has not check for COVID, reports that she does it every 6 weeks routinely.   Has been sweating some but denies fever chills No nausea vomiting No myalgias No recent COVID test She has interstitial lung disease but reports no major problems with chest congestion or sputum "everything is on the sinuses".    Review of Systems See above   Past Medical History:  Diagnosis Date   Arthritis    Asthmatic bronchitis with acute exacerbation 02/06/2015   Bronchial pneumonia    Colon polyp    Diverticulitis    DVT (deep venous thrombosis) (HCC)    lower extremity   H/O cardiac catheterization 2004   Normal coronary arteries   Heart murmur    History of stress test 05/21/2011   Hx of echocardiogram 01/27/2010   Normal Ef 55% the transmitral spectral doppler flow  pattern is normal for age. the left ventricular wall motion is normal   IPF (idiopathic pulmonary fibrosis) ([place]) 01/2018   Migraines    Pancreatitis    Thyroid disease    TIA (transient ischemic attack)    8 years ago    Past Surgical History:  Procedure Laterality Date   BREAST BIOPSY     [facility]   CARDIAC CATHETERIZATION     10/2013   CATARACT EXTRACTION Right 03/22/2018   LEFT HEART CATHETERIZATION WITH CORONARY ANGIOGRAM N/A 10/25/2013   Procedure: LEFT HEART CATHETERIZATION WITH CORONARY ANGIOGRAM;  Surgeon: Troy Sine, MD;  Location: [facility] CATH LAB;  Service: Cardiovascular;  Laterality: N/A;   NECK SURGERY     x's 2   TOTAL KNEE ARTHROPLASTY     Bilateral x's 2   VAGINAL HYSTERECTOMY  10/17/1998   [name]   VIDEO BRONCHOSCOPY Bilateral 01/24/2018   Procedure: VIDEO BRONCHOSCOPY WITH FLUORO;  Surgeon: [name], MD;  Location: [facility];  Service: Cardiopulmonary;  Laterality: Bilateral;    Allergies as of 01/14/2021       Reactions   Prednisone Other (See Comments)   Changed personality    Atrovent Hfa [ipratropium Bromide Hfa] Other (See Comments)   Pt could not sleep   Cheratussin Ac [guaifenesin-codeine]    Codeine Other (See Comments)   Pt takes promethazine with codeine at home   Hydrocodone Itching   Influenza Vaccines Other (See Comments)   Pt reports heart attack after last flu shot  Motrin [ibuprofen] Other (See Comments)   "Gives false reading in blood"   Oxycodone-acetaminophen Itching        Medication List        Accurate as of January 14, 2021  4:28 PM. If you have any questions, ask your nurse or doctor.          benzonatate 100 MG capsule Commonly known as: TESSALON Take 2 capsules (200 mg total) by mouth 3 (three) times daily as needed for cough.   Cardizem LA 120 MG 24 hr tablet Generic drug: diltiazem Take 1 tablet (120 mg total) by mouth daily.   clopidogrel 75 MG tablet Commonly known as: PLAVIX Take 1  tablet (75 mg total) by mouth daily.   cyclobenzaprine 5 MG tablet Commonly known as: FLEXERIL Take 1 tablet (5 mg total) by mouth at bedtime.   erythromycin base 500 MG tablet Commonly known as: E-MYCIN Take 1 tablet (500 mg total) by mouth 2 (two) times daily.   fluticasone 50 MCG/ACT nasal spray Commonly known as: FLONASE Place 2 sprays into both nostrils daily. What changed: when to take this   furosemide 40 MG tablet Commonly known as: LASIX Take 1 tablet (40 mg total) by mouth 2 (two) times daily.   Klor-Con M20 20 MEQ tablet Generic drug: potassium chloride SA Take 1 tablet (20 mEq total) by mouth 2 (two) times daily. Will take 3 a daily only as needed.   multivitamin tablet Take 1 tablet by mouth every other day. Gluten free/Vegetarian Multivitamin   Nintedanib 100 MG Caps Commonly known as: Ofev Take by mouth.   ondansetron 4 MG disintegrating tablet Commonly known as: ZOFRAN-ODT TAKE 1 TABLET BY MOUTH EVERY 8 HOURS AS NEEDED FOR NAUSEA AND VOMITING   Synthroid 112 MCG tablet Generic drug: levothyroxine 1 po qd   vitamin C 500 MG tablet Commonly known as: ASCORBIC ACID Take 1,000 mg by mouth 2 (two) times daily.   Vitamin D3 125 MCG (5000 UT) Caps Take 5,000 Units by mouth daily.   vitamin E 1000 UNIT capsule Take 1,000 Units by mouth daily.           Objective:   Physical Exam Pulse 87   Ht 5\' 1"  (1.549 m)   Wt 131 lb (59.4 kg)   SpO2 99%   BMI 24.75 kg/m  This is a telephone evaluation, she is alert oriented x3, in no distress, speaking in complete sentences, no cough or wheezing noted.    Assessment     75 year old female, PMH includes BCC, thyroid disease, anxiety, migraines, TIA, dysphonia, cardiovascular disease, interstitial lung disease, presents with:  Sinusitis: Suspect sinusitis by history, she knows the limitations of a telephone evaluation. She has interstitial lung disease, at risk of worsening symptoms. Request amoxicillin,  nothing else work according to the patient Plan: Continue with Flonase, amoxicillin x1 week, call if not gradually better ILD: Currently holding Ofev d/t GI side effects according to the patient  I discussed the assessment and treatment plan with the patient. The patient was provided an opportunity to ask questions and all were answered. The patient agreed with the plan and demonstrated an understanding of the instructions.   The patient was advised to call back or seek an in-person evaluation if the symptoms worsen or if the condition fails to improve as anticipated.  I provided 12 minutes of non-face-to-face time during this encounter.  Kathlene November, MD

## 2021-01-16 DIAGNOSIS — J342 Deviated nasal septum: Secondary | ICD-10-CM | POA: Diagnosis not present

## 2021-01-16 DIAGNOSIS — J3489 Other specified disorders of nose and nasal sinuses: Secondary | ICD-10-CM | POA: Diagnosis not present

## 2021-02-03 ENCOUNTER — Telehealth: Payer: Self-pay | Admitting: Family Medicine

## 2021-02-03 NOTE — Telephone Encounter (Signed)
Pt called back and was informed she needed to schedule a Medicare wellness visit and she declined

## 2021-02-03 NOTE — Telephone Encounter (Signed)
Left message for patient to call back and schedule Medicare Annual Wellness Visit (AWV) in office.  ° °If not able to come in office, please offer to do virtually or by telephone.  Left office number and my jabber #[phone]. ° °Due for AWVI ° °Please schedule at anytime with Nurse Health Advisor. °  °

## 2021-02-05 DIAGNOSIS — R490 Dysphonia: Secondary | ICD-10-CM | POA: Diagnosis not present

## 2021-02-05 DIAGNOSIS — J383 Other diseases of vocal cords: Secondary | ICD-10-CM | POA: Diagnosis not present

## 2021-02-19 ENCOUNTER — Telehealth (INDEPENDENT_AMBULATORY_CARE_PROVIDER_SITE_OTHER): Payer: Self-pay | Admitting: Family Medicine

## 2021-02-19 ENCOUNTER — Other Ambulatory Visit: Payer: Self-pay

## 2021-02-19 DIAGNOSIS — U071 COVID-19: Secondary | ICD-10-CM | POA: Diagnosis not present

## 2021-02-19 DIAGNOSIS — I10 Essential (primary) hypertension: Secondary | ICD-10-CM | POA: Diagnosis not present

## 2021-02-19 DIAGNOSIS — Z91199 Patient's noncompliance with other medical treatment and regimen due to unspecified reason: Secondary | ICD-10-CM

## 2021-02-19 DIAGNOSIS — J0141 Acute recurrent pansinusitis: Secondary | ICD-10-CM | POA: Diagnosis not present

## 2021-02-19 DIAGNOSIS — J208 Acute bronchitis due to other specified organisms: Secondary | ICD-10-CM | POA: Diagnosis not present

## 2021-02-19 DIAGNOSIS — R0989 Other specified symptoms and signs involving the circulatory and respiratory systems: Secondary | ICD-10-CM | POA: Diagnosis not present

## 2021-02-19 DIAGNOSIS — J849 Interstitial pulmonary disease, unspecified: Secondary | ICD-10-CM | POA: Diagnosis not present

## 2021-02-19 DIAGNOSIS — B9689 Other specified bacterial agents as the cause of diseases classified elsewhere: Secondary | ICD-10-CM | POA: Diagnosis not present

## 2021-02-20 ENCOUNTER — Other Ambulatory Visit: Payer: Self-pay

## 2021-02-20 ENCOUNTER — Encounter (HOSPITAL_BASED_OUTPATIENT_CLINIC_OR_DEPARTMENT_OTHER): Payer: Self-pay | Admitting: *Deleted

## 2021-02-20 ENCOUNTER — Other Ambulatory Visit (HOSPITAL_BASED_OUTPATIENT_CLINIC_OR_DEPARTMENT_OTHER): Payer: Self-pay

## 2021-02-20 ENCOUNTER — Emergency Department (HOSPITAL_BASED_OUTPATIENT_CLINIC_OR_DEPARTMENT_OTHER)
Admission: EM | Admit: 2021-02-20 | Discharge: 2021-02-20 | Disposition: A | Discharge status: Home / Self Care | Payer: Medicare Other | Attending: Emergency Medicine | Admitting: Emergency Medicine

## 2021-02-20 DIAGNOSIS — Z96653 Presence of artificial knee joint, bilateral: Secondary | ICD-10-CM | POA: Insufficient documentation

## 2021-02-20 DIAGNOSIS — U071 COVID-19: Secondary | ICD-10-CM | POA: Insufficient documentation

## 2021-02-20 DIAGNOSIS — I10 Essential (primary) hypertension: Secondary | ICD-10-CM | POA: Diagnosis not present

## 2021-02-20 DIAGNOSIS — Z85828 Personal history of other malignant neoplasm of skin: Secondary | ICD-10-CM | POA: Insufficient documentation

## 2021-02-20 DIAGNOSIS — R059 Cough, unspecified: Secondary | ICD-10-CM | POA: Diagnosis present

## 2021-02-20 DIAGNOSIS — Z8673 Personal history of transient ischemic attack (TIA), and cerebral infarction without residual deficits: Secondary | ICD-10-CM | POA: Diagnosis not present

## 2021-02-20 DIAGNOSIS — J4520 Mild intermittent asthma, uncomplicated: Secondary | ICD-10-CM | POA: Insufficient documentation

## 2021-02-20 DIAGNOSIS — E039 Hypothyroidism, unspecified: Secondary | ICD-10-CM | POA: Diagnosis not present

## 2021-02-20 DIAGNOSIS — Z7902 Long term (current) use of antithrombotics/antiplatelets: Secondary | ICD-10-CM | POA: Insufficient documentation

## 2021-02-20 DIAGNOSIS — Z79899 Other long term (current) drug therapy: Secondary | ICD-10-CM | POA: Diagnosis not present

## 2021-02-20 MED ORDER — NIRMATRELVIR/RITONAVIR (PAXLOVID)TABLET
3.0000 | ORAL_TABLET | Freq: Two times a day (BID) | ORAL | 0 refills | Status: AC
  Filled 2021-02-20: qty 30, 5d supply, fill #0

## 2021-02-20 MED ORDER — NIRMATRELVIR/RITONAVIR (PAXLOVID)TABLET: 3.0000 | ORAL_TABLET | Freq: Two times a day (BID) | ORAL | 0 refills | Status: DC

## 2021-02-20 NOTE — ED Provider Notes (Signed)
[facility] Provider Note   CSN: [id number] Arrival date & time: 02/20/21  1328     History Chief Complaint  Patient presents with   Covid Positive    Alexandria [name] is a 75 y.o. female.  HPI Patient is a 75 year old female with past medical history sniffing and for idiopathic pulmonary fibrosis, thyroid disease, pneumonia  Patient presents to the emergency room today with complaints of cough for 4 days she states her symptoms started Monday morning.  She states she has had some fatigue and myalgias.  Denies any nausea vomiting or diarrhea no hemoptysis she denies any chest pain or difficulty breathing.  She states that she tested positive for COVID-19 yesterday  She is here in the ER today because she is worried that her husband also has COVID-19. Denies any aggravating or mitigating factors.  No other associate symptoms.      Past Medical History:  Diagnosis Date   Arthritis    Asthmatic bronchitis with acute exacerbation 02/06/2015   Bronchial pneumonia    Colon polyp    Diverticulitis    DVT (deep venous thrombosis) (HCC)    lower extremity   H/O cardiac catheterization 2004   Normal coronary arteries   Heart murmur    History of stress test 05/21/2011   Hx of echocardiogram 01/27/2010   Normal Ef 55% the transmitral spectral doppler flow pattern is normal for age. the left ventricular wall motion is normal   IPF (idiopathic pulmonary fibrosis) ([place]) 01/2018   Migraines    Pancreatitis    Thyroid disease    TIA (transient ischemic attack)    8 years ago    Patient Active Problem List   Diagnosis Date Noted   Dumping syndrome 03/13/2020   Hyperglycemia 03/13/2020   Arthritis    Bronchial pneumonia    Colon polyp    DVT (deep venous thrombosis) (HCC)    Heart murmur    Migraines    Pancreatitis    Thyroid disease    Aortic root dilatation ([place]) 11/24/2019   Leukocytosis 11/06/2019   Diarrhea 11/06/2019    Generalized abdominal pain 11/06/2019   Pre-ulcerative corn or callous 09/07/2019   Nausea 09/07/2019   Pain of left calf 05/09/2019   Tibial pain 05/09/2019   Essential hypertension 02/08/2019   Healthcare maintenance 11/10/2018   Bradycardia 07/22/2018   Pansinusitis 06/23/2018   History of transient ischemic attack (TIA) 03/24/2018   IPF (idiopathic pulmonary fibrosis) ([place]) 03/24/2018   Allergic rhinitis 02/16/2018   Eustachian tube dysfunction, bilateral 02/16/2018   URI (upper respiratory infection) 02/16/2018   Seasonal allergic rhinitis due to pollen 02/16/2018   ILD (interstitial lung disease) ([place]) 01/07/2018   Dyspnea 01/07/2018   Acute respiratory failure ([place]) 01/06/2018   TIA (transient ischemic attack) 01/06/2018   Chronic hypokalemia 12/15/2017   Chronic cough 11/26/2017   Basal cell carcinoma (BCC) of neck 06/07/2017   Anisometropia 06/02/2017   [place] of macula of both eyes 06/02/2017   Photopsia of left eye 06/02/2017   Cardiac murmur 25/08/3974   Diastolic dysfunction 73/41/9379   Situational anxiety 05/07/2017   Headache 05/07/2017   Mitral and aortic insufficiency 05/07/2017   Cellulitis of left lower extremity 04/30/2017   Migraine without status migrainosus, not intractable 06/09/2016   Insomnia 11/18/2015   Normal coronary arteries 05/20/2015   Chest pain at rest 05/20/2015   RBBB 05/20/2015   Abnormal CXR 05/20/2015   Abnormal CT of the chest 05/20/2015   Hx of  myocardial infarction 05/08/2015   Bruising 05/08/2015   Upper airway cough syndrome 02/14/2015   Asthmatic bronchitis with acute exacerbation 02/06/2015   Mild intermittent asthma without complication 85/05/7739   Oliguria 12/28/2014   Hypothyroidism 10/25/2014   Fatigue 10/25/2014   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   Chronic venous insufficiency 10/24/2014   Varicose veins of leg with complications 28/78/6767   Degeneration of intervertebral disc of cervical region 06/29/2014    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   Generalized osteoarthritis of multiple sites 06/29/2014   History of stroke without residual deficits 06/29/2014   Palpitations 11/08/2013   SOB (shortness of breath) 10/03/2013   DOE (dyspnea on exertion) 10/03/2013   Edema of both legs 10/03/2013   Edema of lower extremity 10/03/2013   Hair loss 11/25/2012   Alopecia 11/25/2012   Diverticulitis 07/14/2012   Diverticulitis of intestine 07/14/2012   Hammer toe 06/13/2012   Recurrent abdominal pain 04/17/2012   Hypoglycemia 01/06/2012   History of stress test 05/21/2011   Hx of echocardiogram 01/27/2010   H/O cardiac catheterization 2004    Past Surgical History:  Procedure Laterality Date   BREAST BIOPSY     Isaiah Blakes   CARDIAC CATHETERIZATION     10/2013   CATARACT EXTRACTION Right 03/22/2018   LEFT HEART CATHETERIZATION WITH CORONARY ANGIOGRAM N/A 10/25/2013   Procedure: LEFT HEART CATHETERIZATION WITH CORONARY ANGIOGRAM;  Surgeon: Troy Sine, MD;  Location: [facility] CATH [facility];  Service: Cardiovascular;  Laterality: N/A;   NECK SURGERY     x's 2   TOTAL KNEE ARTHROPLASTY     Bilateral x's 2   VAGINAL HYSTERECTOMY  10/17/1998   [name]   VIDEO BRONCHOSCOPY Bilateral 01/24/2018   Procedure: VIDEO BRONCHOSCOPY WITH FLUORO;  Surgeon: [name], MD;  Location: [facility];  Service: Cardiopulmonary;  Laterality: Bilateral;     OB History   No obstetric history on file.     Family History  Problem Relation Age of Onset   Arthritis Other    Colon cancer Father    Prostate cancer Father    HIV Brother        86   Kidney cancer Brother    Colon cancer Brother    Lung cancer Brother    Other Brother        Mouth Cancer   Ovarian cancer Mother    Uterine cancer Mother    Heart disease Maternal Grandmother    Stroke Maternal Grandmother    Hypertension Maternal Grandmother    Diabetes Maternal Grandmother     Social History   Tobacco Use    Smoking status: Never   Smokeless tobacco: Never  Vaping Use   Vaping Use: Never used  Substance Use Topics   Alcohol use: No    Alcohol/week: 0.0 standard drinks   Drug use: No    Home Medications Prior to Admission medications   Medication Sig Start Date End Date Taking? Authorizing Provider  amoxicillin (AMOXIL) 875 MG tablet Take 1 tablet (875 mg total) by mouth 2 (two) times daily. 01/14/21   Colon Branch, MD  benzonatate (TESSALON) 100 MG capsule Take 2 capsules (200 mg total) by mouth 3 (three) times daily as needed for cough. Patient not taking: Reported on 01/14/2021 10/21/20   Carollee Herter, Alferd Apa, DO  Cholecalciferol (VITAMIN D3) 125 MCG (5000 UT) CAPS Take 5,000 Units by mouth daily.    [provider]  clopidogrel (PLAVIX) 75 MG tablet Take 1 tablet (75 mg total) by  mouth daily. 11/26/20   [name], DO  cyclobenzaprine (FLEXERIL) 5 MG tablet Take 1 tablet (5 mg total) by mouth at bedtime. Patient not taking: No sig reported 11/13/20   Saguier, Percell Miller, PA-C  diltiazem (CARDIZEM LA) 120 MG 24 hr tablet Take 1 tablet (120 mg total) by mouth daily. 11/26/20   Roma Schanz R, DO  fluticasone (FLONASE) 50 MCG/ACT nasal spray Place 2 sprays into both nostrils daily. Patient taking differently: Place 2 sprays into both nostrils every 3 (three) days. 11/26/20   [name], DO  furosemide (LASIX) 40 MG tablet Take 1 tablet (40 mg total) by mouth 2 (two) times daily. 11/26/20   Lowne Chase, Yvonne R, DO  KLOR-CON M20 20 MEQ tablet Take 1 tablet (20 mEq total) by mouth 2 (two) times daily. Will take 3 a daily only as needed. 11/26/20   [name], DO  Multiple Vitamin (MULTIVITAMIN) tablet Take 1 tablet by mouth every other day. Gluten free/Vegetarian Multivitamin    [provider]  Nintedanib (OFEV) 100 MG CAPS Take by mouth. Patient not taking: Reported on 01/14/2021 07/03/20   [provider]  nirmatrelvir/ritonavir EUA  (PAXLOVID) 20 x 150 MG & 10 x 100MG  TABS Take 3 tablets by mouth 2 (two) times daily for 5 days. Patient GFR is 68. Take nirmatrelvir (150 mg) two tablets twice daily for 5 days and ritonavir (100 mg) one tablet twice daily for 5 days. 02/20/21 02/25/21  Tedd Sias, PA  ondansetron (ZOFRAN-ODT) 4 MG disintegrating tablet TAKE 1 TABLET BY MOUTH EVERY 8 HOURS AS NEEDED FOR NAUSEA AND VOMITING 11/26/20   Carollee Herter, Alferd Apa, DO  SYNTHROID 112 MCG tablet 1 po qd 11/26/20   Roma Schanz R, DO  vitamin C (ASCORBIC ACID) 500 MG tablet Take 1,000 mg by mouth 2 (two) times daily.     [provider]  vitamin E 1000 UNIT capsule Take 1,000 Units by mouth daily.    [provider]    Allergies    Prednisone, Atrovent hfa [ipratropium bromide hfa], Cheratussin ac [guaifenesin-codeine], Codeine, Hydrocodone, Influenza vaccines, Motrin [ibuprofen], and Oxycodone-acetaminophen  Review of Systems   Review of Systems  Constitutional:  Positive for fatigue. Negative for chills and fever.  HENT:  Positive for congestion.   Eyes:  Negative for pain.  Respiratory:  Positive for cough. Negative for shortness of breath.   Cardiovascular:  Negative for chest pain and leg swelling.  Gastrointestinal:  Negative for abdominal pain and vomiting.  Genitourinary:  Negative for dysuria.  Musculoskeletal:  Negative for myalgias.  Skin:  Negative for rash.  Neurological:  Negative for dizziness and headaches.   Physical Exam Updated Vital Signs BP 120/72 (BP Location: Right Arm)   Pulse 89   Temp 99.4 F (37.4 C) (Oral)   Resp 18   Ht 5\' 4"  (1.626 m)   Wt 61.2 kg   SpO2 97%   BMI 23.17 kg/m   Physical Exam Vitals and nursing note reviewed.  Constitutional:      General: She is not in acute distress.    Comments: Pleasant well-appearing 75 year old.  In no acute distress.  Sitting comfortably in bed.  Able answer questions appropriately follow commands. No increased work of  breathing. Speaking in full sentences.   HENT:     Head: Normocephalic and atraumatic.     Nose: Nose normal.     Mouth/Throat:     Mouth: Mucous membranes are  moist.  Eyes:     General: No scleral icterus. Cardiovascular:     Rate and Rhythm: Normal rate and regular rhythm.     Pulses: Normal pulses.     Heart sounds: Normal heart sounds.  Pulmonary:     Effort: Pulmonary effort is normal. No respiratory distress.     Comments: Speaking in full sentences, coughing occasionally. Lungs with faint rhonchi that clear with cough. Faint crackles auscultated but clear with several deep breaths. Abdominal:     Palpations: Abdomen is soft.     Tenderness: There is no abdominal tenderness. There is no guarding or rebound.  Musculoskeletal:     Cervical back: Normal range of motion.     Right lower leg: No edema.     Left lower leg: No edema.  Skin:    General: Skin is warm and dry.     Capillary Refill: Capillary refill takes less than 2 seconds.  Neurological:     Mental Status: She is alert. Mental status is at baseline.  Psychiatric:        Mood and Affect: Mood normal.        Behavior: Behavior normal.    ED Results / Procedures / Treatments   Labs (all labs ordered are listed, but only abnormal results are displayed) Labs Reviewed - No data to display  EKG None  Radiology No results found.  Procedures Procedures   Medications Ordered in ED Medications - No data to display  ED Course  I have reviewed the triage vital signs and the nursing notes.  Pertinent labs & imaging results that were available during my care of the patient were reviewed by me and considered in my medical decision making (see chart for details).  Clinical Course as of 02/20/21 1914  Thu Feb 20, 2021  1533 Labs reviewed creatinine stable at 0.84 most recently 4 months ago.  No history of CKD. [WF]    Clinical Course User Index [WF] Tedd Sias, [place]   MDM Rules/Calculators/A&P                           Patient is 75 year old female with past medical history significant for idiopathic pulmonary fibrosis on medications for this presented to the ER today with 4 days of cough  I reviewed EMR it appears that she does have a positive COVID test done at urgent care and was able to review this result  Physical exam.  Patient is speaking full sentences does not have any evidence of increased work of breathing.  She tells me that she is in the ER today because she wanted to have her husband tested for COVID.  She is within the first 5 days of symptom onset and given her age and risk given her pulmonary disease will describe paxlovid.   Creatinine stable at 0.4 most recently 2 months ago.  Alexandria [name] was evaluated in Emergency Department on 02/20/2021 for the symptoms described in the history of present illness. She was evaluated in the context of the global COVID-19 pandemic, which necessitated consideration that the patient might be at risk for infection with the SARS-CoV-2 virus that causes COVID-19. Institutional protocols and algorithms that pertain to the evaluation of patients at risk for COVID-19 are in a state of rapid change based on information released by regulatory bodies including the CDC and federal and state organizations. These policies and algorithms were followed during the patient's care in the ED.  Final Clinical Impression(s) / ED Diagnoses Final diagnoses:  COVID-19    Rx / DC Orders ED Discharge Orders          Ordered    nirmatrelvir/ritonavir EUA (PAXLOVID) 20 x 150 MG & 10 x 100MG  TABS  2 times daily,   Status:  Discontinued        02/20/21 1550    nirmatrelvir/ritonavir EUA (PAXLOVID) 20 x 150 MG & 10 x 100MG  TABS  2 times daily        02/20/21 1619             Tedd Sias, [place] 02/20/21 1915    Blanchie Dessert, MD 02/20/21 2228

## 2021-02-20 NOTE — Discharge Instructions (Addendum)
I have written you prescription for the antiviral paxlovid which I recommend that you start taking.  If you are not starting this medication you will need to start it within the next 24 hours because it is been shown to be effective if started within 5 days.  Continue taking Tylenol and ibuprofen. This will help prevent fever and also if your symptoms of COVID.  Return to the ER for any new or concerning symptoms.  Since you are not taking your Crestor/statin medicine continue do not take this while you are on the antiviral  Also do not take the co-Q10 on your med list while you are taking the antiviral

## 2021-02-20 NOTE — Progress Notes (Signed)
Pt cancelled on same day of her appt.

## 2021-02-20 NOTE — ED Triage Notes (Signed)
Covid + , sx x 5 days , cough .

## 2021-02-26 DIAGNOSIS — J40 Bronchitis, not specified as acute or chronic: Secondary | ICD-10-CM | POA: Diagnosis not present

## 2021-02-26 DIAGNOSIS — U071 COVID-19: Secondary | ICD-10-CM | POA: Diagnosis not present

## 2021-02-26 DIAGNOSIS — R079 Chest pain, unspecified: Secondary | ICD-10-CM | POA: Diagnosis not present

## 2021-02-26 DIAGNOSIS — Z87891 Personal history of nicotine dependence: Secondary | ICD-10-CM | POA: Diagnosis not present

## 2021-03-20 DIAGNOSIS — M950 Acquired deformity of nose: Secondary | ICD-10-CM | POA: Diagnosis not present

## 2021-03-20 DIAGNOSIS — J3489 Other specified disorders of nose and nasal sinuses: Secondary | ICD-10-CM | POA: Diagnosis not present

## 2021-03-21 ENCOUNTER — Telehealth: Payer: Self-pay | Admitting: Family Medicine

## 2021-03-21 ENCOUNTER — Other Ambulatory Visit: Payer: Self-pay

## 2021-03-21 ENCOUNTER — Ambulatory Visit (INDEPENDENT_AMBULATORY_CARE_PROVIDER_SITE_OTHER): Payer: Medicare Other | Admitting: Cardiology

## 2021-03-21 ENCOUNTER — Encounter: Payer: Self-pay | Admitting: Cardiology

## 2021-03-21 ENCOUNTER — Telehealth: Payer: Self-pay | Admitting: Cardiology

## 2021-03-21 VITALS — BP 130/60 | HR 71 | Ht 64.0 in | Wt 145.4 lb

## 2021-03-21 DIAGNOSIS — Z79899 Other long term (current) drug therapy: Secondary | ICD-10-CM | POA: Diagnosis not present

## 2021-03-21 DIAGNOSIS — R0602 Shortness of breath: Secondary | ICD-10-CM

## 2021-03-21 DIAGNOSIS — I4719 Other supraventricular tachycardia: Secondary | ICD-10-CM

## 2021-03-21 DIAGNOSIS — J342 Deviated nasal septum: Secondary | ICD-10-CM

## 2021-03-21 DIAGNOSIS — J8410 Pulmonary fibrosis, unspecified: Secondary | ICD-10-CM | POA: Diagnosis not present

## 2021-03-21 DIAGNOSIS — I471 Supraventricular tachycardia: Secondary | ICD-10-CM

## 2021-03-21 MED ORDER — FUROSEMIDE 40 MG PO TABS: 40.0000 mg | ORAL_TABLET | Freq: Every day | ORAL | 3 refills | Status: DC

## 2021-03-21 NOTE — Telephone Encounter (Signed)
Left message for patient to call back if needed, since she has an appointment with Dr. Harriet Masson this morning.

## 2021-03-21 NOTE — Progress Notes (Signed)
Cardiology Office Note:    Date:  03/22/2021   ID:  Alexandria [name], DOB Jan 18, 1946, MRN [id number]  PCP:  Carollee Herter, Alferd Apa, DO  Cardiologist:  Berniece Salines, DO  Electrophysiologist:  None   Referring MD: Carollee Herter, Alferd Apa, *   " I feel like I am swelling and I am short of breath"   History of Present Illness:    Alexandria [name] is a 75 y.o. female with a hx of  TIA, DVT, paroxysmal atrial tachycardia, chronic venous insufficiency, varicose veins, hypertension is here today for follow-up visit.   I saw the patient March 05, 2020 at that time she had not been taking the beta-blocker but experiencing palpitations.  We also discussed her varicose veins and bilateral leg edema and I recommended compression stocking.  I saw the patient on July 17, 2020 at that time she appeared to be doing well from a cardiovascular standpoint she was on Cardizem and was happy with the improvement her palpitations.  Since that time she tells me that she has had some worsening shortness of breath she has been evaluated by ENT she was told that she has a deviated septum which may be causing this problem for her. She has had some swelling in the meantime and she notify my office I sent the patient some Lasix.  She has been taking this Lasix with no improvement.  Past Medical History:  Diagnosis Date   Arthritis    Asthmatic bronchitis with acute exacerbation 02/06/2015   Bronchial pneumonia    Colon polyp    Diverticulitis    DVT (deep venous thrombosis) (HCC)    lower extremity   H/O cardiac catheterization 2004   Normal coronary arteries   Heart murmur    History of stress test 05/21/2011   Hx of echocardiogram 01/27/2010   Normal Ef 55% the transmitral spectral doppler flow pattern is normal for age. the left ventricular wall motion is normal   IPF (idiopathic pulmonary fibrosis) ([place]) 01/2018   Migraines    Pancreatitis    Thyroid disease    TIA (transient ischemic  attack)    8 years ago    Past Surgical History:  Procedure Laterality Date   BREAST BIOPSY     [place]   CARDIAC CATHETERIZATION     10/2013   CATARACT EXTRACTION Right 03/22/2018   LEFT HEART CATHETERIZATION WITH CORONARY ANGIOGRAM N/A 10/25/2013   Procedure: LEFT HEART CATHETERIZATION WITH CORONARY ANGIOGRAM;  Surgeon: Troy Sine, MD;  Location: [facility] CATH LAB;  Service: Cardiovascular;  Laterality: N/A;   NECK SURGERY     x's 2   TOTAL KNEE ARTHROPLASTY     Bilateral x's 2   VAGINAL HYSTERECTOMY  10/17/1998   [name]   VIDEO BRONCHOSCOPY Bilateral 01/24/2018   Procedure: VIDEO BRONCHOSCOPY WITH FLUORO;  Surgeon: [name], MD;  Location: [facility];  Service: Cardiopulmonary;  Laterality: Bilateral;    Current Medications: Current Meds  Medication Sig   benzonatate (TESSALON) 100 MG capsule Take 2 capsules (200 mg total) by mouth 3 (three) times daily as needed for cough.   Cholecalciferol (VITAMIN D3) 125 MCG (5000 UT) CAPS Take 5,000 Units by mouth daily.   clopidogrel (PLAVIX) 75 MG tablet Take 1 tablet (75 mg total) by mouth daily.   cyclobenzaprine (FLEXERIL) 5 MG tablet Take 1 tablet (5 mg total) by mouth at bedtime.   diltiazem (CARDIZEM LA) 120 MG 24 hr tablet Take 1 tablet (120 mg total)  by mouth daily.   furosemide (LASIX) 40 MG tablet Take 1 tablet (40 mg total) by mouth daily.   KLOR-CON M20 20 MEQ tablet Take 1 tablet (20 mEq total) by mouth 2 (two) times daily. Will take 3 a daily only as needed.   Multiple Vitamin (MULTIVITAMIN) tablet Take 1 tablet by mouth every other day. Gluten free/Vegetarian Multivitamin   Nintedanib (OFEV) 100 MG CAPS Take by mouth.   ondansetron (ZOFRAN-ODT) 4 MG disintegrating tablet TAKE 1 TABLET BY MOUTH EVERY 8 HOURS AS NEEDED FOR NAUSEA AND VOMITING   SYNTHROID 112 MCG tablet 1 po qd   vitamin C (ASCORBIC ACID) 500 MG tablet Take 1,000 mg by mouth 2 (two) times daily.    vitamin E 1000 UNIT capsule Take 1,000 Units by  mouth daily.   [DISCONTINUED] furosemide (LASIX) 40 MG tablet Take 1 tablet (40 mg total) by mouth 2 (two) times daily.     Allergies:   Prednisone, Atrovent hfa [ipratropium bromide hfa], Cheratussin ac [guaifenesin-codeine], Codeine, Hydrocodone, Influenza vaccines, Motrin [ibuprofen], and Oxycodone-acetaminophen   Social History   Socioeconomic History   Marital status: Married    Spouse name: [name]   Number of children: 3   Years of education: Jr college   Highest education level: Not on file  Occupational History   Occupation: DISABLED    Employer: UNEMPLOYED  Tobacco Use   Smoking status: Never   Smokeless tobacco: Never  Vaping Use   Vaping Use: Never used  Substance and Sexual Activity   Alcohol use: No    Alcohol/week: 0.0 standard drinks   Drug use: No   Sexual activity: Not on file  Other Topics Concern   Not on file  Social History Narrative   Lives with husband [name]   Caffeine use: coffee (2 cups per day)   Mostly right-handed   Social Determinants of Radio broadcast assistant Strain: Not on file  Food Insecurity: Not on file  Transportation Needs: Not on file  Physical Activity: Not on file  Stress: Not on file  Social Connections: Not on file     Family History: The patient's family history includes Arthritis in an other family member; Colon cancer in her brother and father; Diabetes in her maternal grandmother; HIV in her brother; Heart disease in her maternal grandmother; Hypertension in her maternal grandmother; Kidney cancer in her brother; Lung cancer in her brother; Other in her brother; Ovarian cancer in her mother; Prostate cancer in her father; Stroke in her maternal grandmother; Uterine cancer in her mother.  ROS:   Review of Systems  Constitution: Negative for decreased appetite, fever and weight gain.  HENT: Negative for congestion, ear discharge, hoarse voice and sore throat.   Eyes: Negative for discharge, redness, vision loss in  right eye and visual halos.  Cardiovascular: Negative for chest pain, dyspnea on exertion, leg swelling, orthopnea and palpitations.  Respiratory: Negative for cough, hemoptysis, shortness of breath and snoring.   Endocrine: Negative for heat intolerance and polyphagia.  Hematologic/Lymphatic: Negative for bleeding problem. Does not bruise/bleed easily.  Skin: Negative for flushing, nail changes, rash and suspicious lesions.  Musculoskeletal: Negative for arthritis, joint pain, muscle cramps, myalgias, neck pain and stiffness.  Gastrointestinal: Negative for abdominal pain, bowel incontinence, diarrhea and excessive appetite.  Genitourinary: Negative for decreased libido, genital sores and incomplete emptying.  Neurological: Negative for brief paralysis, focal weakness, headaches and loss of balance.  Psychiatric/Behavioral: Negative for altered mental status, depression and suicidal ideas.  Allergic/Immunologic:  Negative for HIV exposure and persistent infections.    EKGs/Labs/Other Studies Reviewed:    The following studies were reviewed today:   EKG:   Echocardiogram February 2021IMPRESSIONS     1. Left ventricular ejection fraction, by visual estimation, is 55 to  60%. The left ventricle has normal function. There is no left ventricular  hypertrophy.   2. Left ventricular diastolic parameters are consistent with Grade I  diastolic dysfunction (impaired relaxation).   3. The left ventricle has no regional wall motion abnormalities.   4. Global right ventricle has normal systolic function.The right  ventricular size is normal. No increase in right ventricular wall  thickness.   5. Left atrial size was normal.   6. Right atrial size was normal.   7. The mitral valve is normal in structure. Mild mitral valve  regurgitation. No evidence of mitral stenosis.   8. The tricuspid valve is normal in structure. Tricuspid valve  regurgitation is mild.   9. The aortic valve is normal in  structure. Aortic valve regurgitation is  mild to moderate. No evidence of aortic valve sclerosis or stenosis.  10. The pulmonic valve was normal in structure. Pulmonic valve  regurgitation is not visualized.  11. There is mild dilatation of the ascending aorta measuring 38 mm.  12. Normal pulmonary artery systolic pressure.   FINDINGS   Left Ventricle: Left ventricular ejection fraction, by visual estimation,  is 55 to 60%. The left ventricle has normal function. The left ventricle  has no regional wall motion abnormalities. There is no left ventricular  hypertrophy. Left ventricular  diastolic parameters are consistent with Grade I diastolic dysfunction  (impaired relaxation). Normal left atrial pressure.   Right Ventricle: The right ventricular size is normal. No increase in  right ventricular wall thickness. Global RV systolic function is has  normal systolic function. The tricuspid regurgitant velocity is 1.83 m/s,  and with an assumed right atrial pressure   of 3 mmHg, the estimated right ventricular systolic pressure is normal at  16.3 mmHg.   Left Atrium: Left atrial size was normal in size.   Right Atrium: Right atrial size was normal in size   Pericardium: There is no evidence of pericardial effusion.   Mitral Valve: The mitral valve is normal in structure. Mild mitral valve  regurgitation. No evidence of mitral valve stenosis by observation.   Tricuspid Valve: The tricuspid valve is normal in structure. Tricuspid  valve regurgitation is mild.   Aortic Valve: The aortic valve is normal in structure. Aortic valve  regurgitation is mild to moderate. Aortic regurgitation PHT measures 477  msec. The aortic valve is structurally normal, with no evidence of  sclerosis or stenosis.   Pulmonic Valve: The pulmonic valve was normal in structure. Pulmonic valve  regurgitation is not visualized. Pulmonic regurgitation is not visualized.   Aorta: Aortic dilatation noted. There  is mild dilatation of the ascending  aorta measuring 38 mm.   Venous: The inferior vena cava is normal in size with greater than 50%  respiratory variability, suggesting right atrial pressure of 3 mmHg.   IAS/Shunts: No atrial level shunt detected by color flow Doppler. There is  no evidence of a patent foramen ovale. No ventricular septal defect is  seen or detected. There is no evidence of an atrial septal defect.    ZIO monitor October 2021 The patient wore the monitor for 12 days 21 hours starting  12/24/2019 . Indication: Dizziness   The minimum heart rate was 43  bpm, maximum heart rate was 171 bpm, and average heart rate was 68 bpm. Predominant underlying rhythm was Sinus Rhythm.   Rhythm. Bundle Branch Block/IVCD was present.    15 Supraventricular Tachycardia runs occurred, the run with the fastest interval lasting 6 beats with a max rate of 171bpm, the longest lasting 10 beats with an avg rate of 103 bpm.     Premature atrial complexes  were rare (<1.0%).  Premature Ventricular complexes  were rare (<1.0%). No ventricular tachycardia, no pauses, No AV block and no atrial fibrillation present.   12 patient triggered events:  1 associated with Supraventricular tachycardia, 3 associated with premature atrial complex and the remaining associated with sinus rhythm. 2 diary events noted all associated with sinus rhythm.    Conclusion: This study is remarkable for supraventricular tachycardia which is likely atrial tachycardia with variable block.    Recent Labs: 10/18/2020: ALT 20 03/21/2021: BUN 21; Creatinine, Ser 0.84; Hemoglobin 13.0; Magnesium 2.3; NT-Pro BNP 265; Platelets 213; Potassium 3.8; Sodium 140; TSH 2.350  Recent Lipid Panel    Component Value Date/Time   CHOL 196 03/18/2020 0823   TRIG 91.0 03/18/2020 0823   HDL 58.70 03/18/2020 0823   CHOLHDL 3 03/18/2020 0823   VLDL 18.2 03/18/2020 0823   LDLCALC 119 (H) 03/18/2020 0823   LDLCALC 136 (H) 11/30/2019 0937     Physical Exam:    VS:  BP 130/60 (BP Location: Left Arm)    Pulse 71    Ht 5\' 4"  (1.626 m)    Wt 145 lb 6.4 oz (66 kg)    SpO2 98%    BMI 24.96 kg/m     Wt Readings from Last 3 Encounters:  03/21/21 145 lb 6.4 oz (66 kg)  02/20/21 135 lb (61.2 kg)  01/14/21 131 lb (59.4 kg)     GEN: Well nourished, well developed in no acute distress HEENT: Normal NECK: No JVD; No carotid bruits LYMPHATICS: No lymphadenopathy CARDIAC: S1S2 noted,RRR, no murmurs, rubs, gallops RESPIRATORY:  Clear to auscultation without rales, wheezing or rhonchi  ABDOMEN: Soft, non-tender, non-distended, +bowel sounds, no guarding. EXTREMITIES: No edema, No cyanosis, no clubbing MUSCULOSKELETAL:  No deformity  SKIN: Warm and dry NEUROLOGIC:  Alert and oriented x 3, non-focal PSYCHIATRIC:  Normal affect, good insight  ASSESSMENT:    1. Medication management   2. SOB (shortness of breath)   3. Interstitial pulmonary fibrosis ([place])   4. Deviated septum   5. PAT (paroxysmal atrial tachycardia) (HCC)    PLAN:    There is no evidence of pitting edema on the patient.  Therefore I cut back on her Lasix from 40 mg twice a day to 40 mg daily.  I am going to get lab work today including kidney function as well as BNP.  Further recommendation based on lab result. Her shortness of breath has been attributed to her deviated septum she is planning surgery.  She does not have any chest pain.  In regards to her surgery from a cardiovascular standpoint she could proceed.  The patient does not have any unstable cardiac conditions.  Upon evaluation today, she can achieve 4 METs or greater without anginal symptoms.  According to [facility] and AHA guidelines, she requires no further cardiac workup prior to her noncardiac surgery and should be at acceptable risk.  Our service is available as necessary in the perioperative period.   She also needs clearance from her pulmonary doctor who manages her IPF.  Continue her AV nodal  blockers for her PAT.  The patient is in agreement with the above plan. The patient left the office in stable condition.  The patient will follow up in   Medication Adjustments/Labs and Tests Ordered: Current medicines are reviewed at length with the patient today.  Concerns regarding medicines are outlined above.  Orders Placed This Encounter  Procedures   Basic Metabolic Panel (BMET)   Magnesium   CBC with Differential/Platelet   TSH+T4F+T3Free   Pro b natriuretic peptide (BNP)   Meds ordered this encounter  Medications   furosemide (LASIX) 40 MG tablet    Sig: Take 1 tablet (40 mg total) by mouth daily.    Dispense:  90 tablet    Refill:  3    Patient Instructions  Medication Instructions:  Your physician has recommended you make the following change in your medication:  DECREASE: Lasix 40 mg once daily *If you need a refill on your cardiac medications before your next appointment, please call your pharmacy*   Lab Work: Your physician recommends that you return for lab work in:  TODAY: BMET, Mag, CBC, TSH+T3+T4, BNP If you have labs (blood work) drawn today and your tests are completely normal, you will receive your results only by: MyChart Message (if you have MyChart) OR A paper copy in the mail If you have any lab test that is abnormal or we need to change your treatment, we will call you to review the results.   Testing/Procedures: None   Follow-Up: At [facility], you and your health needs are our priority.  As part of our continuing mission to provide you with exceptional heart care, we have created designated Provider Care Teams.  These Care Teams include your primary Cardiologist (physician) and Advanced Practice Providers (APPs -  Physician Assistants and Nurse Practitioners) who all work together to provide you with the care you need, when you need it.  We recommend signing up for the patient portal called "MyChart".  Sign up information is provided on  this After Visit Summary.  MyChart is used to connect with patients for Virtual Visits (Telemedicine).  Patients are able to view lab/test results, encounter notes, upcoming appointments, etc.  Non-urgent messages can be sent to your provider as well.   To learn more about what you can do with MyChart, go to [email or web address].    Your next appointment:   6 month(s)  The format for your next appointment:   In person  Provider:   Berniece Salines, DO     Other Instructions     Adopting a Healthy Lifestyle.  Know what a healthy weight is for you (roughly BMI <25) and aim to maintain this   Aim for 7+ servings of fruits and vegetables daily   65-80+ fluid ounces of water or unsweet tea for healthy kidneys   Limit to max 1 drink of alcohol per day; avoid smoking/tobacco   Limit animal fats in diet for cholesterol and heart health - choose grass fed whenever available   Avoid highly processed foods, and foods high in saturated/trans fats   Aim for low stress - take time to unwind and care for your mental health   Aim for 150 min of moderate intensity exercise weekly for heart health, and weights twice weekly for bone health   Aim for 7-9 hours of sleep daily   When it comes to diets, agreement about the perfect plan isnt easy to find, even among the experts. Experts at the [employer]  developed an idea known as the Healthy Eating Plate. Just imagine a plate divided into logical, healthy portions.   The emphasis is on diet quality:   Load up on vegetables and fruits - one-half of your plate: Aim for color and variety, and remember that potatoes dont count.   Go for whole grains - one-quarter of your plate: Whole wheat, barley, wheat berries, quinoa, oats, brown rice, and foods made with them. If you want pasta, go with whole wheat pasta.   Protein power - one-quarter of your plate: Fish, chicken, beans, and nuts are all healthy, versatile protein sources.  Limit red meat.   The diet, however, does go beyond the plate, offering a few other suggestions.   Use healthy plant oils, such as olive, canola, soy, corn, sunflower and peanut. Check the labels, and avoid partially hydrogenated oil, which have unhealthy trans fats.   If youre thirsty, drink water. Coffee and tea are good in moderation, but skip sugary drinks and limit milk and dairy products to one or two daily servings.   The type of carbohydrate in the diet is more important than the amount. Some sources of carbohydrates, such as vegetables, fruits, whole grains, and beans-are healthier than others.   Finally, stay active  Signed, Berniece Salines, DO  03/22/2021 8:18 PM    [place]

## 2021-03-21 NOTE — Telephone Encounter (Signed)
I attempted to leave a message for patient to call back and schedule Medicare Annual Wellness Visit (AWV) in office. No voice mail.  If not able to come in office, please offer to do virtually or by telephone.  Left office number and my jabber [phone].  Due for AWVI  Please schedule at anytime with Nurse Health Advisor.

## 2021-03-21 NOTE — Telephone Encounter (Signed)
Pt c/o swelling: STAT is pt has developed SOB within 24 hours  How much weight have you gained and in what time span?  Patient assumes she has gained weight, but she does not have any readings. She assumes the weight gain is due to her retention  If swelling, where is the swelling located?  Left leg and chest   Are you currently taking a fluid pill?  Yes, 2 patient takes Lasix 40 MG twice daily  Are you currently SOB?  No   Do you have a log of your daily weights (if so, list)?  No log available   Have you gained 3 pounds in a day or 5 pounds in a week?  Unsure   Have you traveled recently?  No

## 2021-03-21 NOTE — Patient Instructions (Addendum)
Medication Instructions:  Your physician has recommended you make the following change in your medication:  DECREASE: Lasix 40 mg once daily *If you need a refill on your cardiac medications before your next appointment, please call your pharmacy*   Lab Work: Your physician recommends that you return for lab work in:  TODAY: BMET, Mag, CBC, TSH+T3+T4, BNP If you have labs (blood work) drawn today and your tests are completely normal, you will receive your results only by: MyChart Message (if you have MyChart) OR A paper copy in the mail If you have any lab test that is abnormal or we need to change your treatment, we will call you to review the results.   Testing/Procedures: None   Follow-Up: At [facility], you and your health needs are our priority.  As part of our continuing mission to provide you with exceptional heart care, we have created designated Provider Care Teams.  These Care Teams include your primary Cardiologist (physician) and Advanced Practice Providers (APPs -  Physician Assistants and Nurse Practitioners) who all work together to provide you with the care you need, when you need it.  We recommend signing up for the patient portal called "MyChart".  Sign up information is provided on this After Visit Summary.  MyChart is used to connect with patients for Virtual Visits (Telemedicine).  Patients are able to view lab/test results, encounter notes, upcoming appointments, etc.  Non-urgent messages can be sent to your provider as well.   To learn more about what you can do with MyChart, go to [email or web address].    Your next appointment:   6 month(s)  The format for your next appointment:   In person  Provider:   Berniece Salines, DO     Other Instructions

## 2021-03-21 NOTE — Telephone Encounter (Signed)
Patient states she does not want this appointment.

## 2021-03-22 LAB — CBC WITH DIFFERENTIAL/PLATELET
Basophils Absolute: 0.1 10*3/uL (ref 0.0–0.2)
Basos: 1 %
EOS (ABSOLUTE): 0.5 10*3/uL — ABNORMAL HIGH (ref 0.0–0.4)
Eos: 8 %
Hematocrit: 38.5 % (ref 34.0–46.6)
Hemoglobin: 13 g/dL (ref 11.1–15.9)
Immature Grans (Abs): 0 10*3/uL (ref 0.0–0.1)
Immature Granulocytes: 1 %
Lymphocytes Absolute: 1.6 10*3/uL (ref 0.7–3.1)
Lymphs: 30 %
MCH: 31.8 pg (ref 26.6–33.0)
MCHC: 33.8 g/dL (ref 31.5–35.7)
MCV: 94 fL (ref 79–97)
Monocytes Absolute: 0.9 10*3/uL (ref 0.1–0.9)
Monocytes: 15 %
Neutrophils Absolute: 2.5 10*3/uL (ref 1.4–7.0)
Neutrophils: 45 %
Platelets: 213 10*3/uL (ref 150–450)
RBC: 4.09 x10E6/uL (ref 3.77–5.28)
RDW: 13.6 % (ref 11.7–15.4)
WBC: 5.6 10*3/uL (ref 3.4–10.8)

## 2021-03-22 LAB — BASIC METABOLIC PANEL
BUN/Creatinine Ratio: 25 (ref 12–28)
BUN: 21 mg/dL (ref 8–27)
CO2: 29 mmol/L (ref 20–29)
Calcium: 9.3 mg/dL (ref 8.7–10.3)
Chloride: 97 mmol/L (ref 96–106)
Creatinine, Ser: 0.84 mg/dL (ref 0.57–1.00)
Glucose: 104 mg/dL — ABNORMAL HIGH (ref 70–99)
Potassium: 3.8 mmol/L (ref 3.5–5.2)
Sodium: 140 mmol/L (ref 134–144)
eGFR: 72 mL/min/{1.73_m2} (ref >59)

## 2021-03-22 LAB — TSH+T4F+T3FREE
Free T4: 1.75 ng/dL (ref 0.82–1.77)
T3, Free: 2.7 pg/mL (ref 2.0–4.4)
TSH: 2.35 u[IU]/mL (ref 0.450–4.500)

## 2021-03-22 LAB — MAGNESIUM: Magnesium: 2.3 mg/dL (ref 1.6–2.3)

## 2021-03-22 LAB — PRO B NATRIURETIC PEPTIDE: NT-Pro BNP: 265 pg/mL (ref 0–738)

## 2021-03-23 DIAGNOSIS — R059 Cough, unspecified: Secondary | ICD-10-CM | POA: Diagnosis not present

## 2021-03-23 DIAGNOSIS — Z20822 Contact with and (suspected) exposure to covid-19: Secondary | ICD-10-CM | POA: Diagnosis not present

## 2021-03-23 DIAGNOSIS — J019 Acute sinusitis, unspecified: Secondary | ICD-10-CM | POA: Diagnosis not present

## 2021-03-26 ENCOUNTER — Telehealth: Payer: Self-pay

## 2021-03-26 ENCOUNTER — Telehealth: Payer: Self-pay | Admitting: Cardiology

## 2021-03-26 NOTE — Telephone Encounter (Signed)
° °  Pre-operative Risk Assessment    Patient Name: Alexandria [name]  DOB: 10-Nov-1945 MRN: [id number]      Request for Surgical Clearance    Procedure:   SEPTORHINOPLASTY  Date of Surgery:  Clearance TBD                                  Surgeon's Group or Practice Name:  [facility] Phone number:  [phone] Fax number:  [phone]   Type of Clearance Requested:   - Pharmacy:  Hold Clopidogrel (Plavix)     Type of Anesthesia:  Not Indicated {

## 2021-03-26 NOTE — Telephone Encounter (Signed)
° °  Primary Cardiologist: Berniece Salines, DO  Chart reviewed as part of pre-operative protocol coverage. Given past medical history and time since last visit, based on ACC/AHA guidelines, Alexandria [name] would be at acceptable risk for the planned procedure without further cardiovascular testing.   Patient's Plavix is prescribed by her PCP.  She will need to receive recommendations for holding Plavix from their office.  I will route this recommendation to the requesting party via Epic fax function and remove from pre-op pool.  Please call with questions.  Jossie Ng. Stewart Sasaki NP-C    03/26/2021, 11:18 AM [place] [address] Office [phone] Fax [phone]

## 2021-03-26 NOTE — Telephone Encounter (Signed)
Patient is returning call to discuss lab results. 

## 2021-03-26 NOTE — Telephone Encounter (Signed)
Called patient. Patient made aware of her lab results. Verbalized understanding. No questions or concerns expressed at this time.   

## 2021-04-09 DIAGNOSIS — Z1231 Encounter for screening mammogram for malignant neoplasm of breast: Secondary | ICD-10-CM | POA: Diagnosis not present

## 2021-04-09 DIAGNOSIS — Z124 Encounter for screening for malignant neoplasm of cervix: Secondary | ICD-10-CM | POA: Diagnosis not present

## 2021-04-09 DIAGNOSIS — Z6825 Body mass index (BMI) 25.0-25.9, adult: Secondary | ICD-10-CM | POA: Diagnosis not present

## 2021-04-11 ENCOUNTER — Telehealth: Payer: Self-pay | Admitting: Cardiology

## 2021-04-11 NOTE — Telephone Encounter (Signed)
Spoke with patient to let her know to contact her PCP regarding her knee and groin pain/swelling. She stated she would.

## 2021-04-11 NOTE — Telephone Encounter (Signed)
Spoke with patient who reports having swelling behind her left knee and swelling and pain on the left side of her groin. She has pain in her left groin when she hyperflexes her left foot. She is constipated and feels bloated. She never had any periumbilical pain of rever. She is concerned about the pain and swelling in the knee and groin. Please advise.

## 2021-04-11 NOTE — Telephone Encounter (Signed)
Please have the patient speak with her PCP about her knee swelling and her groin pain and swelling as well.  She will need to be evaluated with a comprehensive examination by her primary care doctor.

## 2021-04-11 NOTE — Telephone Encounter (Signed)
Pt c/o swelling: STAT is pt has developed SOB within 24 hours  If swelling, where is the swelling located? Left leg   How much weight have you gained and in what time span? 6 pounds in a week   Have you gained 3 pounds in a day or 5 pounds in a week? yes  Do you have a log of your daily weights (if so, list)? no  Are you currently taking a fluid pill? yes  Are you currently SOB?only on exertion   Have you traveled recently? no   groin area hurts

## 2021-04-23 DIAGNOSIS — Z8616 Personal history of COVID-19: Secondary | ICD-10-CM | POA: Diagnosis not present

## 2021-04-23 DIAGNOSIS — K219 Gastro-esophageal reflux disease without esophagitis: Secondary | ICD-10-CM | POA: Diagnosis not present

## 2021-04-23 DIAGNOSIS — K589 Irritable bowel syndrome without diarrhea: Secondary | ICD-10-CM | POA: Diagnosis not present

## 2021-04-23 DIAGNOSIS — J984 Other disorders of lung: Secondary | ICD-10-CM | POA: Diagnosis not present

## 2021-04-23 DIAGNOSIS — R490 Dysphonia: Secondary | ICD-10-CM | POA: Diagnosis not present

## 2021-04-23 DIAGNOSIS — Z79899 Other long term (current) drug therapy: Secondary | ICD-10-CM | POA: Diagnosis not present

## 2021-04-23 DIAGNOSIS — Z8673 Personal history of transient ischemic attack (TIA), and cerebral infarction without residual deficits: Secondary | ICD-10-CM | POA: Diagnosis not present

## 2021-04-23 DIAGNOSIS — J342 Deviated nasal septum: Secondary | ICD-10-CM | POA: Diagnosis not present

## 2021-04-23 DIAGNOSIS — J8417 Interstitial lung disease with progressive fibrotic phenotype in diseases classified elsewhere: Secondary | ICD-10-CM | POA: Diagnosis not present

## 2021-04-23 DIAGNOSIS — J84112 Idiopathic pulmonary fibrosis: Secondary | ICD-10-CM | POA: Diagnosis not present

## 2021-04-23 DIAGNOSIS — R911 Solitary pulmonary nodule: Secondary | ICD-10-CM | POA: Diagnosis not present

## 2021-04-23 DIAGNOSIS — I252 Old myocardial infarction: Secondary | ICD-10-CM | POA: Diagnosis not present

## 2021-04-23 DIAGNOSIS — J479 Bronchiectasis, uncomplicated: Secondary | ICD-10-CM | POA: Diagnosis not present

## 2021-04-25 NOTE — Telephone Encounter (Signed)
Preoperative team, please contact patient and let her know that her preoperative cardiac evaluation has been completed and sent to the requesting office.  It was completed on 03/26/2021 at 11:18 AM.  Thank you for your help,  Jossie Ng. Tamantha Saline NP-C    04/25/2021, 12:01 PM [place] [address] Office [phone] Fax [phone]

## 2021-04-25 NOTE — Telephone Encounter (Signed)
Pt has been made aware that her clearance was sent back on 03/26/21 and she was given the fax # it was sent to. Pt was appreciative of the call back.

## 2021-04-25 NOTE — Telephone Encounter (Signed)
Patient calling in to chest the stats US clearance. Please advise

## 2021-04-30 DIAGNOSIS — H04123 Dry eye syndrome of bilateral lacrimal glands: Secondary | ICD-10-CM | POA: Diagnosis not present

## 2021-04-30 DIAGNOSIS — H3561 Retinal hemorrhage, right eye: Secondary | ICD-10-CM | POA: Diagnosis not present

## 2021-05-07 DIAGNOSIS — J0141 Acute recurrent pansinusitis: Secondary | ICD-10-CM | POA: Diagnosis not present

## 2021-05-07 DIAGNOSIS — R040 Epistaxis: Secondary | ICD-10-CM | POA: Diagnosis not present

## 2021-05-10 DIAGNOSIS — B9689 Other specified bacterial agents as the cause of diseases classified elsewhere: Secondary | ICD-10-CM | POA: Diagnosis not present

## 2021-05-10 DIAGNOSIS — J060 Acute laryngopharyngitis: Secondary | ICD-10-CM | POA: Diagnosis not present

## 2021-05-10 DIAGNOSIS — J0141 Acute recurrent pansinusitis: Secondary | ICD-10-CM | POA: Diagnosis not present

## 2021-05-10 DIAGNOSIS — J208 Acute bronchitis due to other specified organisms: Secondary | ICD-10-CM | POA: Diagnosis not present

## 2021-05-15 DIAGNOSIS — J84112 Idiopathic pulmonary fibrosis: Secondary | ICD-10-CM | POA: Diagnosis not present

## 2021-05-15 DIAGNOSIS — J159 Unspecified bacterial pneumonia: Secondary | ICD-10-CM | POA: Diagnosis not present

## 2021-05-15 DIAGNOSIS — J849 Interstitial pulmonary disease, unspecified: Secondary | ICD-10-CM | POA: Diagnosis not present

## 2021-05-15 DIAGNOSIS — J4521 Mild intermittent asthma with (acute) exacerbation: Secondary | ICD-10-CM | POA: Diagnosis not present

## 2021-05-15 DIAGNOSIS — R059 Cough, unspecified: Secondary | ICD-10-CM | POA: Diagnosis not present

## 2021-05-21 DIAGNOSIS — J329 Chronic sinusitis, unspecified: Secondary | ICD-10-CM | POA: Diagnosis not present

## 2021-05-21 DIAGNOSIS — J310 Chronic rhinitis: Secondary | ICD-10-CM | POA: Diagnosis not present

## 2021-05-21 DIAGNOSIS — Z20822 Contact with and (suspected) exposure to covid-19: Secondary | ICD-10-CM | POA: Diagnosis not present

## 2021-05-21 DIAGNOSIS — J40 Bronchitis, not specified as acute or chronic: Secondary | ICD-10-CM | POA: Diagnosis not present

## 2021-05-21 DIAGNOSIS — R0981 Nasal congestion: Secondary | ICD-10-CM | POA: Diagnosis not present

## 2021-05-21 DIAGNOSIS — J84112 Idiopathic pulmonary fibrosis: Secondary | ICD-10-CM | POA: Diagnosis not present

## 2021-05-21 DIAGNOSIS — R059 Cough, unspecified: Secondary | ICD-10-CM | POA: Diagnosis not present

## 2021-05-22 ENCOUNTER — Telehealth: Payer: Self-pay | Admitting: Family Medicine

## 2021-05-22 DIAGNOSIS — M950 Acquired deformity of nose: Secondary | ICD-10-CM | POA: Diagnosis not present

## 2021-05-22 DIAGNOSIS — J3489 Other specified disorders of nose and nasal sinuses: Secondary | ICD-10-CM | POA: Diagnosis not present

## 2021-05-22 NOTE — Telephone Encounter (Signed)
[facility] called to check the status of the forms faxed over to [place]. Form is a request to hold Plavix preoperatively for a septorhinoplasty. Fax verified. They will be faxing it again. For questions they can be reached at [phone] option #1.

## 2021-05-22 NOTE — Telephone Encounter (Signed)
Form received and placed in folder for review

## 2021-06-05 ENCOUNTER — Telehealth: Payer: Self-pay | Admitting: Family Medicine

## 2021-06-05 NOTE — Telephone Encounter (Signed)
Left message for patient to call back and schedule Medicare Annual Wellness Visit (AWV) in office.  ? ?If not able to come in office, please offer to do virtually or by telephone.  Left office number and my jabber [phone]. ? ?Last AWV:04/06/2009 ? ?Please schedule at anytime with Nurse Health Advisor. ?  ?

## 2021-06-09 DIAGNOSIS — M546 Pain in thoracic spine: Secondary | ICD-10-CM | POA: Diagnosis not present

## 2021-06-12 DIAGNOSIS — M546 Pain in thoracic spine: Secondary | ICD-10-CM | POA: Diagnosis not present

## 2021-06-13 ENCOUNTER — Telehealth: Payer: Self-pay | Admitting: Family Medicine

## 2021-06-13 ENCOUNTER — Other Ambulatory Visit: Payer: Self-pay | Admitting: Family Medicine

## 2021-06-13 MED ORDER — BENZONATATE 100 MG PO CAPS: 200.0000 mg | ORAL_CAPSULE | Freq: Three times a day (TID) | ORAL | 2 refills | Status: DC | PRN

## 2021-06-13 NOTE — Telephone Encounter (Signed)
Medication:  ?benzonatate (TESSALON) 100 MG capsule [[id number]] ?  ? ?Has the patient contacted their pharmacy? No. ?(If no, request that the patient contact the pharmacy for the refill.) ?(If yes, when and what did the pharmacy advise?) ? ?  ? ?Preferred Pharmacy (with phone number or street name):  ?[id number]- [place], [place] - [address] AT [address]  ?[id number] ?Phone:  [phone] Fax:  [phone] ?  ? ?Agent: Please be advised that RX refills may take up to 3 business days. We ask that you follow-up with your pharmacy. ? ?

## 2021-06-13 NOTE — Telephone Encounter (Signed)
Refill sent.

## 2021-06-20 DIAGNOSIS — M546 Pain in thoracic spine: Secondary | ICD-10-CM | POA: Diagnosis not present

## 2021-06-23 ENCOUNTER — Other Ambulatory Visit: Payer: Self-pay | Admitting: Family Medicine

## 2021-06-23 DIAGNOSIS — E876 Hypokalemia: Secondary | ICD-10-CM

## 2021-07-01 DIAGNOSIS — M546 Pain in thoracic spine: Secondary | ICD-10-CM | POA: Diagnosis not present

## 2021-07-07 DIAGNOSIS — B9689 Other specified bacterial agents as the cause of diseases classified elsewhere: Secondary | ICD-10-CM | POA: Diagnosis not present

## 2021-07-07 DIAGNOSIS — J208 Acute bronchitis due to other specified organisms: Secondary | ICD-10-CM | POA: Diagnosis not present

## 2021-07-07 DIAGNOSIS — J060 Acute laryngopharyngitis: Secondary | ICD-10-CM | POA: Diagnosis not present

## 2021-07-07 DIAGNOSIS — J0141 Acute recurrent pansinusitis: Secondary | ICD-10-CM | POA: Diagnosis not present

## 2021-07-07 DIAGNOSIS — R0989 Other specified symptoms and signs involving the circulatory and respiratory systems: Secondary | ICD-10-CM | POA: Diagnosis not present

## 2021-07-10 DIAGNOSIS — J40 Bronchitis, not specified as acute or chronic: Secondary | ICD-10-CM | POA: Diagnosis not present

## 2021-07-15 DIAGNOSIS — J84112 Idiopathic pulmonary fibrosis: Secondary | ICD-10-CM | POA: Diagnosis not present

## 2021-07-15 DIAGNOSIS — R059 Cough, unspecified: Secondary | ICD-10-CM | POA: Diagnosis not present

## 2021-07-15 DIAGNOSIS — J849 Interstitial pulmonary disease, unspecified: Secondary | ICD-10-CM | POA: Diagnosis not present

## 2021-07-15 DIAGNOSIS — B9689 Other specified bacterial agents as the cause of diseases classified elsewhere: Secondary | ICD-10-CM | POA: Diagnosis not present

## 2021-07-15 DIAGNOSIS — J208 Acute bronchitis due to other specified organisms: Secondary | ICD-10-CM | POA: Diagnosis not present

## 2021-07-16 DIAGNOSIS — M546 Pain in thoracic spine: Secondary | ICD-10-CM | POA: Diagnosis not present

## 2021-07-18 ENCOUNTER — Encounter: Payer: Self-pay | Admitting: Family Medicine

## 2021-07-18 ENCOUNTER — Ambulatory Visit (INDEPENDENT_AMBULATORY_CARE_PROVIDER_SITE_OTHER): Payer: Medicare Other | Admitting: Family Medicine

## 2021-07-18 VITALS — BP 130/70 | HR 68 | Temp 97.9°F | Resp 18 | Ht 64.0 in | Wt 144.0 lb

## 2021-07-18 DIAGNOSIS — R110 Nausea: Secondary | ICD-10-CM

## 2021-07-18 DIAGNOSIS — J40 Bronchitis, not specified as acute or chronic: Secondary | ICD-10-CM | POA: Diagnosis not present

## 2021-07-18 MED ORDER — ONDANSETRON 4 MG PO TBDP: ORAL_TABLET | ORAL | 2 refills | Status: DC

## 2021-07-18 MED ORDER — LEVOFLOXACIN 500 MG PO TABS: 500.0000 mg | ORAL_TABLET | Freq: Every day | ORAL | 0 refills | Status: AC

## 2021-07-18 MED ORDER — BENZONATATE 100 MG PO CAPS: 200.0000 mg | ORAL_CAPSULE | Freq: Three times a day (TID) | ORAL | 2 refills | Status: DC | PRN

## 2021-07-18 MED ORDER — METHYLPREDNISOLONE ACETATE 80 MG/ML IJ SUSP
80.0000 mg | Freq: Once | INTRAMUSCULAR | Status: AC
  Administered 2021-07-18: 80 mg via INTRAMUSCULAR

## 2021-07-18 NOTE — Progress Notes (Signed)
? ?Subjective:  ? ?By signing my name below, I, Zite Okoli, attest that this documentation has been prepared under the direction and in the presence of [name], DO. 07/18/2021   ? ? Patient ID: Alexandria [name], female    DOB: [birth year], 76 y.o.   MRN: [id number] ? ?Chief Complaint  ?Patient presents with  ? Cough  ? Follow-up  ? ? ?HPI ?Patient is in today for an office visit. ? ?She reports having a persistent cough. She has been to the urgent care a few times and was given antibiotics which did not help. Notes that prednisone changes her personality so she cannot take it. She is using her albuterol inhaler. Checks her oxygen levels at home and they are stable.  ? ?Past Medical History:  ?Diagnosis Date  ? Arthritis   ? Asthmatic bronchitis with acute exacerbation 02/06/2015  ? Bronchial pneumonia   ? Colon polyp   ? Diverticulitis   ? DVT (deep venous thrombosis) ([place])   ? lower extremity  ? H/O cardiac catheterization 2004  ? Normal coronary arteries  ? Heart murmur   ? History of stress test 05/21/2011  ? Hx of echocardiogram 01/27/2010  ? Normal Ef 55% the transmitral spectral doppler flow pattern is normal for age. the left ventricular wall motion is normal  ? IPF (idiopathic pulmonary fibrosis) ([place]) 01/2018  ? Migraines   ? Pancreatitis   ? Thyroid disease   ? TIA (transient ischemic attack)   ? 8 years ago  ? ? ?Past Surgical History:  ?Procedure Laterality Date  ? BREAST BIOPSY    ? [facility]akes  ? CARDIAC CATHETERIZATION    ? 10/2013  ? CATARACT EXTRACTION Right 03/22/2018  ? LEFT HEART CATHETERIZATION WITH CORONARY ANGIOGRAM N/A 10/25/2013  ? Procedure: LEFT HEART CATHETERIZATION WITH CORONARY ANGIOGRAM;  Surgeon: Troy Sine, MD;  Location: [facility] CATH LAB;  Service: Cardiovascular;  Laterality: N/A;  ? NECK SURGERY    ? x's 2  ? TOTAL KNEE ARTHROPLASTY    ? Bilateral x's 2  ? VAGINAL HYSTERECTOMY  10/17/1998  ? [name]  ? VIDEO BRONCHOSCOPY Bilateral 01/24/2018  ? Procedure: VIDEO  BRONCHOSCOPY WITH FLUORO;  Surgeon: [name], MD;  Location: [facility];  Service: Cardiopulmonary;  Laterality: Bilateral;  ? ? ?Family History  ?Problem Relation Age of Onset  ? Arthritis Other   ? Colon cancer Father   ? Prostate cancer Father   ? HIV Brother   ?     1982  ? Kidney cancer Brother   ? Colon cancer Brother   ? Lung cancer Brother   ? Other Brother   ?     Mouth Cancer  ? Ovarian cancer Mother   ? Uterine cancer Mother   ? Heart disease Maternal Grandmother   ? Stroke Maternal Grandmother   ? Hypertension Maternal Grandmother   ? Diabetes Maternal Grandmother   ? ? ?Social History  ? ?Socioeconomic History  ? Marital status: Married  ?  Spouse name: [name]  ? Number of children: 3  ? Years of education: [employer]  ? Highest education level: Not on file  ?Occupational History  ? Occupation: DISABLED  ?  Employer: UNEMPLOYED  ?Tobacco Use  ? Smoking status: Never  ? Smokeless tobacco: Never  ?Vaping Use  ? Vaping Use: Never used  ?Substance and Sexual Activity  ? Alcohol use: No  ?  Alcohol/week: 0.0 standard drinks  ? Drug use: No  ?  Sexual activity: Not on file  ?Other Topics Concern  ? Not on file  ?Social History Narrative  ? Lives with husband [name]  ? Caffeine use: coffee (2 cups per day)  ? Mostly right-handed  ? ?Social Determinants of Health  ? ?Financial Resource Strain: Not on file  ?Food Insecurity: Not on file  ?Transportation Needs: Not on file  ?Physical Activity: Not on file  ?Stress: Not on file  ?Social Connections: Not on file  ?Intimate Partner Violence: Not on file  ? ? ?Outpatient Medications Prior to Visit  ?Medication Sig Dispense Refill  ? Cholecalciferol (VITAMIN D3) 125 MCG (5000 UT) CAPS Take 5,000 Units by mouth daily.    ? clopidogrel (PLAVIX) 75 MG tablet Take 1 tablet (75 mg total) by mouth daily. 90 tablet 3  ? KLOR-CON M20 20 MEQ tablet TAKE 1 TABLET (20 MEQ TOTAL) BY MOUTH 2 (TWO) TIMES DAILY. WILL TAKE 3 A DAILY ONLY AS NEEDED. 270 tablet 1  ? Multiple  Vitamin (MULTIVITAMIN) tablet Take 1 tablet by mouth every other day. Gluten free/Vegetarian Multivitamin    ? Nintedanib (OFEV) 100 MG CAPS Take by mouth.    ? SYNTHROID 112 MCG tablet 1 po qd 90 tablet 1  ? vitamin C (ASCORBIC ACID) 500 MG tablet Take 1,000 mg by mouth 2 (two) times daily.     ? vitamin E 1000 UNIT capsule Take 1,000 Units by mouth daily.    ? benzonatate (TESSALON) 100 MG capsule Take 2 capsules (200 mg total) by mouth 3 (three) times daily as needed for cough. 60 capsule 2  ? diltiazem (CARDIZEM LA) 120 MG 24 hr tablet Take 1 tablet (120 mg total) by mouth daily. 90 tablet 3  ? ondansetron (ZOFRAN-ODT) 4 MG disintegrating tablet TAKE 1 TABLET BY MOUTH EVERY 8 HOURS AS NEEDED FOR NAUSEA AND VOMITING 60 tablet 2  ? furosemide (LASIX) 40 MG tablet Take 1 tablet (40 mg total) by mouth daily. 90 tablet 3  ? amoxicillin (AMOXIL) 875 MG tablet Take 1 tablet (875 mg total) by mouth 2 (two) times daily. (Patient not taking: Reported on 03/21/2021) 14 tablet 0  ? cyclobenzaprine (FLEXERIL) 5 MG tablet Take 1 tablet (5 mg total) by mouth at bedtime. (Patient not taking: Reported on 07/18/2021) 5 tablet 0  ? fluticasone (FLONASE) 50 MCG/ACT nasal spray Place 2 sprays into both nostrils daily. (Patient not taking: Reported on 03/21/2021) 16 g 6  ? ?No facility-administered medications prior to visit.  ? ? ?Allergies  ?Allergen Reactions  ? Prednisone Other (See Comments)  ?  Changed personality   ? Atrovent Hfa [Ipratropium Bromide Hfa] Other (See Comments)  ?  Pt could not sleep  ? Cheratussin Ac [Guaifenesin-Codeine]   ? Codeine Other (See Comments)  ?  Pt takes promethazine with codeine at home  ? Hydrocodone Itching  ? Influenza Vaccines Other (See Comments)  ?  Pt reports heart attack after last flu shot  ? Motrin [Ibuprofen] Other (See Comments)  ?  "Gives false reading in blood"  ? Oxycodone-Acetaminophen Itching  ? ? ?Review of Systems  ?Constitutional:  Negative for fever.  ?HENT:  Negative for  congestion, ear pain, hearing loss, sinus pain and sore throat.   ?Eyes:  Negative for blurred vision and pain.  ?Respiratory:  Positive for cough. Negative for sputum production, shortness of breath and wheezing.   ?Cardiovascular:  Negative for chest pain and palpitations.  ?Gastrointestinal:  Negative for blood in stool, constipation, diarrhea, nausea and vomiting.  ?  Genitourinary:  Negative for dysuria, frequency, hematuria and urgency.  ?Musculoskeletal:  Negative for back pain, falls and myalgias.  ?Neurological:  Negative for dizziness, sensory change, loss of consciousness, weakness and headaches.  ?Endo/Heme/Allergies:  Negative for environmental allergies. Does not bruise/bleed easily.  ?Psychiatric/Behavioral:  Negative for depression and suicidal ideas. The patient is not nervous/anxious and does not have insomnia.   ? ?   ?Objective:  ?  ?Physical Exam ?Constitutional:   ?   General: She is not in acute distress. ?   Appearance: Normal appearance. She is not ill-appearing.  ?HENT:  ?   Head: Normocephalic and atraumatic.  ?   Right Ear: External ear normal.  ?   Left Ear: External ear normal.  ?Eyes:  ?   Extraocular Movements: Extraocular movements intact.  ?   Pupils: Pupils are equal, round, and reactive to light.  ?Cardiovascular:  ?   Rate and Rhythm: Normal rate and regular rhythm.  ?   Pulses: Normal pulses.  ?   Heart sounds: Normal heart sounds. No murmur heard. ?  No gallop.  ?Pulmonary:  ?   Effort: Pulmonary effort is normal. No respiratory distress.  ?   Breath sounds: Examination of the right-upper field reveals wheezing. Examination of the left-upper field reveals wheezing. Examination of the right-middle field reveals wheezing. Examination of the left-middle field reveals wheezing. Examination of the right-lower field reveals wheezing. Examination of the left-lower field reveals wheezing. Wheezing present. No rhonchi or rales.  ?Abdominal:  ?   General: Bowel sounds are normal. There is  no distension.  ?   Palpations: Abdomen is soft. There is no mass.  ?   Tenderness: There is no abdominal tenderness. There is no guarding or rebound.  ?   Hernia: No hernia is present.  ?Musculoskeletal:  ?   Cervic

## 2021-07-18 NOTE — Assessment & Plan Note (Signed)
Doxycycline x 10 days  ?con't albuterol  ?F/u pulmonary  ?depomedrol 80 mg ?Pt unable to take prednisone  ?

## 2021-07-18 NOTE — Patient Instructions (Signed)
Acute Bronchitis, Adult ? ?Acute bronchitis is sudden inflammation of the main airways (bronchi) that come off the windpipe (trachea) in the lungs. The swelling causes the airways to get smaller and make more mucus than normal. This can make it hard to breathe and can cause coughing or noisy breathing (wheezing). ?Acute bronchitis may last several weeks. The cough may last longer. Allergies, asthma, and exposure to smoke may make the condition worse. ?What are the causes? ?This condition can be caused by germs and by substances that irritate the lungs, including: ?Cold and flu viruses. The most common cause of this condition is the virus that causes the common cold. ?Bacteria. This is less common. ?Breathing in substances that irritate the lungs, including: ?Smoke from cigarettes and other forms of tobacco. ?Dust and pollen. ?Fumes from household cleaning products, gases, or burned fuel. ?Indoor or outdoor air pollution. ?What increases the risk? ?The following factors may make you more likely to develop this condition: ?A weak body's defense system, also called the immune system. ?A condition that affects your lungs and breathing, such as asthma. ?What are the signs or symptoms? ?Common symptoms of this condition include: ?Coughing. This may bring up clear, yellow, or green mucus from your lungs (sputum). ?Wheezing. ?Runny or stuffy nose. ?Having too much mucus in your lungs (chest congestion). ?Shortness of breath. ?Aches and pains, including sore throat or chest. ?How is this diagnosed? ?This condition is usually diagnosed based on: ?Your symptoms and medical history. ?A physical exam. ?You may also have other tests, including tests to rule out other conditions, such as pneumonia. These tests include: ?A test of lung function. ?Test of a mucus sample to look for the presence of bacteria. ?Tests to check the oxygen level in your blood. ?Blood tests. ?Chest X-ray. ?How is this treated? ?Most cases of acute  bronchitis clear up over time without treatment. Your health care provider may recommend: ?Drinking more fluids to help thin your mucus so it is easier to cough up. ?Taking inhaled medicine (inhaler) to improve air flow in and out of your lungs. ?Using a vaporizer or a humidifier. These are machines that add water to the air to help you breathe better. ?Taking a medicine that thins mucus and clears congestion (expectorant). ?Taking a medicine that prevents or stops coughing (cough suppressant). ?It is notcommon to take an antibiotic medicine for this condition. ?Follow these instructions at home: ? ?Take over-the-counter and prescription medicines only as told by your health care provider. ?Use an inhaler, vaporizer, or humidifier as told by your health care provider. ?Take two teaspoons (10 mL) of honey at bedtime to lessen coughing at night. ?Drink enough fluid to keep your urine pale yellow. ?Do not use any products that contain nicotine or tobacco. These products include cigarettes, chewing tobacco, and vaping devices, such as e-cigarettes. If you need help quitting, ask your health care provider. ?Get plenty of rest. ?Return to your normal activities as told by your health care provider. Ask your health care provider what activities are safe for you. ?Keep all follow-up visits. This is important. ?How is this prevented? ?To lower your risk of getting this condition again: ?Wash your hands often with soap and water for at least 20 seconds. If soap and water are not available, use hand sanitizer. ?Avoid contact with people who have cold symptoms. ?Try not to touch your mouth, nose, or eyes with your hands. ?Avoid breathing in smoke or chemical fumes. Breathing smoke or chemical fumes will make your   condition worse. ?Get the flu shot every year. ?Contact a health care provider if: ?Your symptoms do not improve after 2 weeks. ?You have trouble coughing up the mucus. ?Your cough keeps you awake at night. ?You have a  fever. ?Get help right away if you: ?Cough up blood. ?Feel pain in your chest. ?Have severe shortness of breath. ?Faint or keep feeling like you are going to faint. ?Have a severe headache. ?Have a fever or chills that get worse. ?These symptoms may represent a serious problem that is an emergency. Do not wait to see if the symptoms will go away. Get medical help right away. Call your local emergency services (911 in the [place] Do not drive yourself to the hospital. ?Summary ?Acute bronchitis is inflammation of the main airways (bronchi) that come off the windpipe (trachea) in the lungs. The swelling causes the airways to get smaller and make more mucus than normal. ?Drinking more fluids can help thin your mucus so it is easier to cough up. ?Take over-the-counter and prescription medicines only as told by your health care provider. ?Do not use any products that contain nicotine or tobacco. These products include cigarettes, chewing tobacco, and vaping devices, such as e-cigarettes. If you need help quitting, ask your health care provider. ?Contact a health care provider if your symptoms do not improve after 2 weeks. ?This information is not intended to replace advice given to you by your health care provider. Make sure you discuss any questions you have with your health care provider. ?Document Revised: 07/24/2020 Document Reviewed: 07/24/2020 ?Elsevier Patient Education ? 2023 Elsevier Inc. ? ?

## 2021-07-23 DIAGNOSIS — M546 Pain in thoracic spine: Secondary | ICD-10-CM | POA: Diagnosis not present

## 2021-07-24 DIAGNOSIS — Z5181 Encounter for therapeutic drug level monitoring: Secondary | ICD-10-CM | POA: Diagnosis not present

## 2021-07-24 DIAGNOSIS — J84112 Idiopathic pulmonary fibrosis: Secondary | ICD-10-CM | POA: Diagnosis not present

## 2021-07-24 DIAGNOSIS — R0600 Dyspnea, unspecified: Secondary | ICD-10-CM | POA: Diagnosis not present

## 2021-07-24 DIAGNOSIS — E039 Hypothyroidism, unspecified: Secondary | ICD-10-CM | POA: Diagnosis not present

## 2021-07-24 DIAGNOSIS — J310 Chronic rhinitis: Secondary | ICD-10-CM | POA: Diagnosis not present

## 2021-08-21 ENCOUNTER — Telehealth: Payer: Self-pay | Admitting: Cardiology

## 2021-08-21 NOTE — Telephone Encounter (Signed)
Spoke with pt regarding issues with occasional rectal bleeding. Pt states that her GI doctor advised her to call her cardiologist to see if it was ok to stop this medication. Pt states that she was originally started on this because of a heart attack but I can not find evidence of this. Will forward to primary cardiologist to advise. Pt verbalizes understanding.

## 2021-08-21 NOTE — Telephone Encounter (Signed)
Pt c/o medication issue:  1. Name of Medication: clopidogrel (PLAVIX) 75 MG tablet  2. How are you currently taking this medication (dosage and times per day)? As prescribed but did not take today   3. Are you having a reaction (difficulty breathing--STAT)? Yes   4. What is your medication issue? Pt's GI Dr said to call due to rectal bleeding she is experiencing because he believes it may be due to her current medication. Please advise.

## 2021-08-21 NOTE — Telephone Encounter (Signed)
Tobb, Kardie, DO  You; Orvan July, RN 2 minutes ago (9:39 AM)   From my understanding the patient was started on Plavix due to multiple TIAs and had not been able to tolerate aspirin.  If she is on aspirin 81 mg daily then it would be okay for her to stop the Plavix.    Spoke with pt regarding Dr. Terrial Rhodes recommendations. Pt states she will call her GI back to confirm how she should move forward regarding aspirin vs plavix therapy. Pt verbalizes understanding.

## 2021-08-22 ENCOUNTER — Ambulatory Visit (INDEPENDENT_AMBULATORY_CARE_PROVIDER_SITE_OTHER): Payer: Medicare Other | Admitting: Family Medicine

## 2021-08-22 ENCOUNTER — Encounter: Payer: Self-pay | Admitting: Family Medicine

## 2021-08-22 VITALS — BP 150/82 | HR 67 | Temp 97.9°F | Resp 18 | Ht 64.0 in | Wt 144.8 lb

## 2021-08-22 DIAGNOSIS — N289 Disorder of kidney and ureter, unspecified: Secondary | ICD-10-CM

## 2021-08-22 DIAGNOSIS — R7989 Other specified abnormal findings of blood chemistry: Secondary | ICD-10-CM | POA: Insufficient documentation

## 2021-08-22 DIAGNOSIS — K625 Hemorrhage of anus and rectum: Secondary | ICD-10-CM | POA: Insufficient documentation

## 2021-08-22 LAB — CBC WITH DIFFERENTIAL/PLATELET
Basophils Absolute: 0.1 10*3/uL (ref 0.0–0.1)
Basophils Relative: 0.7 % (ref 0.0–3.0)
Eosinophils Absolute: 0.4 10*3/uL (ref 0.0–0.7)
Eosinophils Relative: 5.2 % — ABNORMAL HIGH (ref 0.0–5.0)
HCT: 39 % (ref 36.0–46.0)
Hemoglobin: 12.9 g/dL (ref 12.0–15.0)
Lymphocytes Relative: 21.9 % (ref 12.0–46.0)
Lymphs Abs: 1.7 10*3/uL (ref 0.7–4.0)
MCHC: 32.9 g/dL (ref 30.0–36.0)
MCV: 99.4 fl (ref 78.0–100.0)
Monocytes Absolute: 1 10*3/uL (ref 0.1–1.0)
Monocytes Relative: 13 % — ABNORMAL HIGH (ref 3.0–12.0)
Neutro Abs: 4.5 10*3/uL (ref 1.4–7.7)
Neutrophils Relative %: 59.2 % (ref 43.0–77.0)
Platelets: 266 10*3/uL (ref 150.0–400.0)
RBC: 3.93 Mil/uL (ref 3.87–5.11)
RDW: 14.8 % (ref 11.5–15.5)
WBC: 7.5 10*3/uL (ref 4.0–10.5)

## 2021-08-22 LAB — COMPREHENSIVE METABOLIC PANEL
ALT: 18 U/L (ref 0–35)
AST: 19 U/L (ref 0–37)
Albumin: 4 g/dL (ref 3.5–5.2)
Alkaline Phosphatase: 78 U/L (ref 39–117)
BUN: 16 mg/dL (ref 6–23)
CO2: 31 mEq/L (ref 19–32)
Calcium: 9.4 mg/dL (ref 8.4–10.5)
Chloride: 102 mEq/L (ref 96–112)
Creatinine, Ser: 0.77 mg/dL (ref 0.40–1.20)
GFR: 74.79 mL/min (ref >60.00)
Glucose, Bld: 86 mg/dL (ref 70–99)
Potassium: 3.8 mEq/L (ref 3.5–5.1)
Sodium: 142 mEq/L (ref 135–145)
Total Bilirubin: 0.5 mg/dL (ref 0.2–1.2)
Total Protein: 7.2 g/dL (ref 6.0–8.3)

## 2021-08-22 NOTE — Patient Instructions (Signed)
Rectal Bleeding  Rectal bleeding is when blood passes out of the opening between the buttocks (anus). People with rectal bleeding may notice bright red blood in their underwear or in the toilet after having a bowel movement. They may also have blood mixed with their stool (feces), or dark red or black stools. Rectal bleeding is usually a sign that something is wrong. Many things can cause rectal bleeding, including: Diverticulosis. This is a condition in which pockets or sacs project from the bowel. Hemorrhoids. These are blood vessels around the anus or inside the rectum that are larger than normal. Anal fissures. This is a tear in the anus. Proctitis and colitis. These are conditions in which the rectum, colon, or anus become inflamed. Polyps. These are growths that can be cancerous (malignant) or noncancerous (benign). Infections of the intestines. Fistulas. These are abnormal openings in the rectum and anus. Rectal prolapse. This is when a part of the rectum sticks out from the anus. Follow these instructions at home: Pay attention to any changes in your symptoms. Take these actions to help reduce bleeding and discomfort: Medicines Take over-the-counter and prescription medicines only as told by your health care provider. Ask your health care provider about changing or stopping your regular medicines or supplements. This is especially important if you are taking blood thinners. Medicines that thin the blood can make rectal bleeding worse. Managing constipation Your condition may cause constipation. To prevent or treat constipation, or to help make your stools soft, you may need to: Drink enough fluid to keep your urine pale yellow. Take over-the-counter or prescription medicines. Eat foods that are high in fiber, such as beans, whole grains, and fresh fruits and vegetables. Ask your health care provider if you need a supplement to give you more fiber. Limit foods that are high in fat and  processed sugars, such as fried or sweet foods.  General instructions Try not to strain when having a bowel movement. Try taking a warm bath. This may help to soothe any pain in your rectum. Keep all follow-up visits as told by your health care provider. This is important. Contact a health care provider if you: Have pain or tenderness in your abdomen. Have a fever. Have weakness. Have nausea. Cannot have a bowel movement. Get help right away if you have: New or increased rectal bleeding. Black or dark red stools. Vomit with blood or something that looks like coffee grounds. A fainting episode. Severe pain in your rectum. Summary Rectal bleeding is usually a sign that something is wrong. This condition should be evaluated by a health care provider. Eat a diet that is high in fiber. This will help keep your stools soft, making it easier to pass stools without straining. Medicines that thin the blood can make rectal bleeding worse. Get help right away if you have new or increased rectal bleeding, black or dark red stools, blood in your vomit, an episode of fainting, or severe pain in your rectum. This information is not intended to replace advice given to you by your health care provider. Make sure you discuss any questions you have with your health care provider. Document Revised: 02/22/2019 Document Reviewed: 02/22/2019 Elsevier Patient Education  2023 Elsevier Inc.  

## 2021-08-22 NOTE — Assessment & Plan Note (Signed)
Check labs  F/u GI  con't to hold plavix per cardiology

## 2021-08-22 NOTE — Progress Notes (Signed)
Established Patient Office Visit  Subjective   Patient ID: Alexandria [name], female    DOB: April 01, 1946  Age: 76 y.o. MRN: [id number]  Chief Complaint  Patient presents with   Rectal Bleeding    X3 days, Pt states she was told to stop taking Plavix by Cardiologist. Pt has been off plavix for 1 day. Pt states only seeing blood when she wipes and not in the toilet. Pt states having  pain and cramping only when using the bathroom    HPI Pt c/o rectal bleed ---  cardiology has told her to stop her plavix and GI wanted her to come here for labs  No abd pain No NVD She was also told her thyroid was abnormal and her kidney function   Patient Active Problem List   Diagnosis Date Noted   Rectal bleed 08/22/2021   Abnormal thyroid blood test 08/22/2021   Abnormal kidney function 08/22/2021   Bronchitis 07/18/2021   Dumping syndrome 03/13/2020   Hyperglycemia 03/13/2020   Arthritis    Bronchial pneumonia    Colon polyp    DVT (deep venous thrombosis) (HCC)    Heart murmur    Migraines    Pancreatitis    Thyroid disease    Aortic root dilatation (HCC) 11/24/2019   Leukocytosis 11/06/2019   Diarrhea 11/06/2019   Generalized abdominal pain 11/06/2019   Pre-ulcerative corn or callous 09/07/2019   Nausea 09/07/2019   Pain of left calf 05/09/2019   Tibial pain 05/09/2019   Essential hypertension 02/08/2019   Healthcare maintenance 11/10/2018   Bradycardia 07/22/2018   Pansinusitis 06/23/2018   History of transient ischemic attack (TIA) 03/24/2018   IPF (idiopathic pulmonary fibrosis) ([place]) 03/24/2018   Allergic rhinitis 02/16/2018   Eustachian tube dysfunction, bilateral 02/16/2018   URI (upper respiratory infection) 02/16/2018   Seasonal allergic rhinitis due to pollen 02/16/2018   ILD (interstitial lung disease) ([place]) 01/07/2018   Dyspnea 01/07/2018   Acute respiratory failure ([place]) 01/06/2018   TIA (transient ischemic attack) 01/06/2018   Chronic hypokalemia 12/15/2017    Chronic cough 11/26/2017   Basal cell carcinoma (BCC) of neck 06/07/2017   Anisometropia 06/02/2017   [place] of macula of both eyes 06/02/2017   Photopsia of left eye 06/02/2017   Cardiac murmur 79/06/8331   Diastolic dysfunction 83/29/1916   Situational anxiety 05/07/2017   Headache 05/07/2017   Mitral and aortic insufficiency 05/07/2017   Cellulitis of left lower extremity 04/30/2017   Migraine without status migrainosus, not intractable 06/09/2016   Insomnia 11/18/2015   Normal coronary arteries 05/20/2015   Chest pain at rest 05/20/2015   RBBB 05/20/2015   Abnormal CXR 05/20/2015   Abnormal CT of the chest 05/20/2015   Hx of myocardial infarction 05/08/2015   Bruising 05/08/2015   Upper airway cough syndrome 02/14/2015   Asthmatic bronchitis with acute exacerbation 02/06/2015   Mild intermittent asthma without complication 60/60/0459   Oliguria 12/28/2014   Hypothyroidism 10/25/2014   Fatigue 10/25/2014   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   Chronic venous insufficiency 10/24/2014   Varicose veins of leg with complications 97/74/1423   Degeneration of intervertebral disc of cervical region 06/29/2014   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   Generalized osteoarthritis of multiple sites 06/29/2014   History of stroke without residual deficits 06/29/2014   Palpitations 11/08/2013   SOB (shortness of breath) 10/03/2013   DOE (dyspnea on exertion) 10/03/2013   Edema of both legs 10/03/2013   Edema of lower extremity 10/03/2013   Hair loss 11/25/2012  Alopecia 11/25/2012   Diverticulitis 07/14/2012   Diverticulitis of intestine 07/14/2012   Hammer toe 06/13/2012   Recurrent abdominal pain 04/17/2012   Hypoglycemia 01/06/2012   History of stress test 05/21/2011   Hx of echocardiogram 01/27/2010   H/O cardiac catheterization 2004   Past Medical History:  Diagnosis Date   Arthritis    Asthmatic bronchitis with acute exacerbation 02/06/2015    Bronchial pneumonia    Colon polyp    Diverticulitis    DVT (deep venous thrombosis) (HCC)    lower extremity   H/O cardiac catheterization 2004   Normal coronary arteries   Heart murmur    History of stress test 05/21/2011   Hx of echocardiogram 01/27/2010   Normal Ef 55% the transmitral spectral doppler flow pattern is normal for age. the left ventricular wall motion is normal   IPF (idiopathic pulmonary fibrosis) ([place]) 01/2018   Migraines    Pancreatitis    Thyroid disease    TIA (transient ischemic attack)    8 years ago   Past Surgical History:  Procedure Laterality Date   BREAST BIOPSY     [place]   CARDIAC CATHETERIZATION     10/2013   CATARACT EXTRACTION Right 03/22/2018   LEFT HEART CATHETERIZATION WITH CORONARY ANGIOGRAM N/A 10/25/2013   Procedure: LEFT HEART CATHETERIZATION WITH CORONARY ANGIOGRAM;  Surgeon: Troy Sine, MD;  Location: [facility] CATH LAB;  Service: Cardiovascular;  Laterality: N/A;   NECK SURGERY     x's 2   TOTAL KNEE ARTHROPLASTY     Bilateral x's 2   VAGINAL HYSTERECTOMY  10/17/1998   [name]   VIDEO BRONCHOSCOPY Bilateral 01/24/2018   Procedure: VIDEO BRONCHOSCOPY WITH FLUORO;  Surgeon: [name], MD;  Location: [place];  Service: Cardiopulmonary;  Laterality: Bilateral;   Social History   Tobacco Use   Smoking status: Never   Smokeless tobacco: Never  Vaping Use   Vaping Use: Never used  Substance Use Topics   Alcohol use: No    Alcohol/week: 0.0 standard drinks   Drug use: No   Social History   Socioeconomic History   Marital status: Married    Spouse name: [name]   Number of children: 3   Years of education: Jr college   Highest education level: Not on file  Occupational History   Occupation: DISABLED    Employer: UNEMPLOYED  Tobacco Use   Smoking status: Never   Smokeless tobacco: Never  Vaping Use   Vaping Use: Never used  Substance and Sexual Activity   Alcohol use: No    Alcohol/week: 0.0 standard drinks    Drug use: No   Sexual activity: Not on file  Other Topics Concern   Not on file  Social History Narrative   Lives with husband [name]   Caffeine use: coffee (2 cups per day)   Mostly right-handed   Social Determinants of Health   Financial Resource Strain: Not on file  Food Insecurity: Not on file  Transportation Needs: Not on file  Physical Activity: Not on file  Stress: Not on file  Social Connections: Not on file  Intimate Partner Violence: Not on file   Family Status  Relation Name Status   Other  (Not Specified)   Father  Deceased   Brother  (Not Specified)   Brother  (Not Specified)   Brother  (Not Specified)   Brother  (Not Specified)   Brother  (Not Specified)   Mother  Deceased   MGM  (Not  Specified)   Family History  Problem Relation Age of Onset   Arthritis Other    Colon cancer Father    Prostate cancer Father    HIV Brother        3   Kidney cancer Brother    Colon cancer Brother    Lung cancer Brother    Other Brother        Mouth Cancer   Ovarian cancer Mother    Uterine cancer Mother    Heart disease Maternal Grandmother    Stroke Maternal Grandmother    Hypertension Maternal Grandmother    Diabetes Maternal Grandmother    Allergies  Allergen Reactions   Prednisone Other (See Comments)    Changed personality    Atrovent Hfa [Ipratropium Bromide Hfa] Other (See Comments)    Pt could not sleep   Cheratussin Ac [Guaifenesin-Codeine]    Codeine Other (See Comments)    Pt takes promethazine with codeine at home   Hydrocodone Itching   Influenza Vaccines Other (See Comments)    Pt reports heart attack after last flu shot   Motrin [Ibuprofen] Other (See Comments)    "Gives false reading in blood"   Oxycodone-Acetaminophen Itching      Review of Systems  Constitutional:  Negative for chills, fever and malaise/fatigue.  HENT:  Negative for congestion and hearing loss.   Eyes:  Negative for discharge.  Respiratory:  Negative for  cough, sputum production and shortness of breath.   Cardiovascular:  Negative for chest pain, palpitations and leg swelling.  Gastrointestinal:  Positive for blood in stool. Negative for abdominal pain, constipation, diarrhea, heartburn, nausea and vomiting.  Genitourinary:  Negative for dysuria, frequency, hematuria and urgency.  Musculoskeletal:  Negative for back pain, falls and myalgias.  Skin:  Negative for rash.  Neurological:  Negative for dizziness, sensory change, loss of consciousness, weakness and headaches.  Endo/Heme/Allergies:  Negative for environmental allergies. Does not bruise/bleed easily.  Psychiatric/Behavioral:  Negative for depression and suicidal ideas. The patient is not nervous/anxious and does not have insomnia.      Objective:     BP (!) 150/82 (BP Location: Left Arm, Patient Position: Sitting, Cuff Size: Normal)   Pulse 67   Temp 97.9 F (36.6 C) (Oral)   Resp 18   Ht _0  (1.626 m)   Wt 144 lb 12.8 oz (65.7 kg)   SpO2 98%   BMI 24.85 kg/m  BP Readings from Last 3 Encounters:  08/22/21 (!) 150/82  07/18/21 130/70  03/21/21 130/60   Wt Readings from Last 3 Encounters:  08/22/21 144 lb 12.8 oz (65.7 kg)  07/18/21 144 lb (65.3 kg)  03/21/21 145 lb 6.4 oz (66 kg)   SpO2 Readings from Last 3 Encounters:  08/22/21 98%  07/18/21 94%  03/21/21 98%      Physical Exam Vitals and nursing note reviewed.  Constitutional:      Appearance: She is well-developed.  HENT:     Head: Normocephalic and atraumatic.  Eyes:     Conjunctiva/sclera: Conjunctivae normal.  Neck:     Thyroid: No thyromegaly.     Vascular: No carotid bruit or JVD.  Cardiovascular:     Rate and Rhythm: Normal rate and regular rhythm.     Heart sounds: Normal heart sounds. No murmur heard. Pulmonary:     Effort: Pulmonary effort is normal. No respiratory distress.     Breath sounds: Normal breath sounds. No wheezing or rales.  Chest:     Chest  wall: No tenderness.   Genitourinary:    Rectum: Guaiac result positive. No mass, tenderness or external hemorrhoid.  Musculoskeletal:     Cervical back: Normal range of motion and neck supple.  Neurological:     Mental Status: She is alert and oriented to person, place, and time.     Results for orders placed or performed in visit on 08/22/21  CBC with Differential/Platelet  Result Value Ref Range   WBC 7.5 4.0 - 10.5 K/uL   RBC 3.93 3.87 - 5.11 Mil/uL   Hemoglobin 12.9 12.0 - 15.0 g/dL   HCT 39.0 36.0 - 46.0 %   MCV 99.4 78.0 - 100.0 fl   MCHC 32.9 30.0 - 36.0 g/dL   RDW 14.8 11.5 - 15.5 %   Platelets 266.0 150.0 - 400.0 K/uL   Neutrophils Relative % 59.2 43.0 - 77.0 %   Lymphocytes Relative 21.9 12.0 - 46.0 %   Monocytes Relative 13.0 (H) 3.0 - 12.0 %   Eosinophils Relative 5.2 (H) 0.0 - 5.0 %   Basophils Relative 0.7 0.0 - 3.0 %   Neutro Abs 4.5 1.4 - 7.7 K/uL   Lymphs Abs 1.7 0.7 - 4.0 K/uL   Monocytes Absolute 1.0 0.1 - 1.0 K/uL   Eosinophils Absolute 0.4 0.0 - 0.7 K/uL   Basophils Absolute 0.1 0.0 - 0.1 K/uL    Last CBC Lab Results  Component Value Date   WBC 7.5 08/22/2021   HGB 12.9 08/22/2021   HCT 39.0 08/22/2021   MCV 99.4 08/22/2021   MCH 31.8 03/21/2021   RDW 14.8 08/22/2021   PLT 266.0 14/23/9532   Last metabolic panel Lab Results  Component Value Date   GLUCOSE 104 (H) 03/21/2021   NA 140 03/21/2021   K 3.8 03/21/2021   CL 97 03/21/2021   CO2 29 03/21/2021   BUN 21 03/21/2021   CREATININE 0.84 03/21/2021   EGFR 72 03/21/2021   CALCIUM 9.3 03/21/2021   PROT 6.5 10/18/2020   ALBUMIN 3.8 10/18/2020   BILITOT 0.5 10/18/2020   ALKPHOS 71 10/18/2020   AST 24 10/18/2020   ALT 20 10/18/2020   ANIONGAP 12 11/13/2019   Last lipids Lab Results  Component Value Date   CHOL 196 03/18/2020   HDL 58.70 03/18/2020   LDLCALC 119 (H) 03/18/2020   TRIG 91.0 03/18/2020   CHOLHDL 3 03/18/2020   Last hemoglobin A1c Lab Results  Component Value Date   HGBA1C 5.4  03/18/2020   Last thyroid functions Lab Results  Component Value Date   TSH 2.350 03/21/2021   T4TOTAL 11.3 07/22/2018   Last vitamin D Lab Results  Component Value Date   VD25OH 29.9 (L) 05/09/2019   Last vitamin B12 and Folate Lab Results  Component Value Date   VITAMINB12 914 (H) 04/16/2016      The ASCVD Risk score (Arnett DK, et al., 2019) failed to calculate for the following reasons:   The patient has a prior MI or stroke diagnosis    Assessment & Plan:   Problem List Items Addressed This Visit       Unprioritized   Rectal bleed - Primary    Check labs  F/u GI  con't to hold plavix per cardiology       Relevant Orders   CBC with Differential/Platelet (Completed)   Abnormal thyroid blood test   Relevant Orders   Thyroid Panel With TSH   Abnormal kidney function   Relevant Orders   Comprehensive metabolic panel  Return if symptoms worsen or fail to improve.    [name], DO

## 2021-08-23 LAB — THYROID PANEL WITH TSH
Free Thyroxine Index: 3.7 (ref 1.4–3.8)
T3 Uptake: 34 % (ref 22–35)
T4, Total: 11 ug/dL (ref 5.1–11.9)
TSH: 1.22 mIU/L (ref 0.40–4.50)

## 2021-08-26 ENCOUNTER — Telehealth: Payer: Self-pay | Admitting: Family Medicine

## 2021-08-26 ENCOUNTER — Other Ambulatory Visit: Payer: Self-pay | Admitting: Family Medicine

## 2021-08-26 DIAGNOSIS — K921 Melena: Secondary | ICD-10-CM

## 2021-08-26 NOTE — Telephone Encounter (Signed)
Pt called- she is scheduled to see GI on 09/18/21- she is still bleeding from the rectum and wants to know what she should do.

## 2021-08-26 NOTE — Telephone Encounter (Signed)
Patient notified of results.  She declined appt with GI initially but will do the visit. Message sent back to referral coordinator for GI and hopefully they will get her scheduled sooner than 09/18/21.

## 2021-08-26 NOTE — Telephone Encounter (Signed)
Pt states she does not need gi referral as she already has a gi dr. She is just waiting on the results so she can let them know what is going on.

## 2021-08-26 NOTE — Telephone Encounter (Signed)
Pt is needing to go over labs, she wanted to mention there is still blood in her stool.

## 2021-08-26 NOTE — Telephone Encounter (Signed)
Please advise about blood in her stool

## 2021-08-27 NOTE — Telephone Encounter (Signed)
Spoke with patient and I got her scheduled with [facility] tomorrow at 940am.

## 2021-08-28 ENCOUNTER — Other Ambulatory Visit: Payer: Self-pay | Admitting: Family Medicine

## 2021-08-28 DIAGNOSIS — K5900 Constipation, unspecified: Secondary | ICD-10-CM | POA: Diagnosis not present

## 2021-08-28 DIAGNOSIS — M47814 Spondylosis without myelopathy or radiculopathy, thoracic region: Secondary | ICD-10-CM | POA: Diagnosis not present

## 2021-08-28 DIAGNOSIS — M545 Low back pain, unspecified: Secondary | ICD-10-CM | POA: Diagnosis not present

## 2021-08-28 DIAGNOSIS — K921 Melena: Secondary | ICD-10-CM | POA: Diagnosis not present

## 2021-08-28 DIAGNOSIS — M160 Bilateral primary osteoarthritis of hip: Secondary | ICD-10-CM | POA: Diagnosis not present

## 2021-08-28 DIAGNOSIS — K625 Hemorrhage of anus and rectum: Secondary | ICD-10-CM | POA: Diagnosis not present

## 2021-08-28 DIAGNOSIS — M47816 Spondylosis without myelopathy or radiculopathy, lumbar region: Secondary | ICD-10-CM | POA: Diagnosis not present

## 2021-08-28 DIAGNOSIS — E039 Hypothyroidism, unspecified: Secondary | ICD-10-CM

## 2021-08-28 DIAGNOSIS — R109 Unspecified abdominal pain: Secondary | ICD-10-CM | POA: Diagnosis not present

## 2021-08-28 NOTE — Telephone Encounter (Signed)
Pt called to extend thanks to Dr. Etter Sjogren for getting her situated and treated!

## 2021-09-03 ENCOUNTER — Other Ambulatory Visit: Payer: Self-pay | Admitting: Family Medicine

## 2021-09-03 ENCOUNTER — Telehealth: Payer: Self-pay | Admitting: Family Medicine

## 2021-09-03 NOTE — Telephone Encounter (Signed)
Pt called asking if the dosage for her synthezoid should be changed based on her previous lab results.

## 2021-09-03 NOTE — Telephone Encounter (Signed)
08/23/2021  4:51 AM - Interface, [facility] Lab Results In  Component Value Flag Ref Range Units Status  T3 Uptake 34   22 - 35 % Final  T4, Total 11.0   5.1 - 11.9 mcg/dL Final  Free Thyroxine Index 3.7   1.4 - 3.8  Final  TSH 1.22   0.40 - 4.50 mIU/L Final

## 2021-09-04 ENCOUNTER — Telehealth: Payer: Self-pay | Admitting: Family Medicine

## 2021-09-04 NOTE — Telephone Encounter (Signed)
Left message for patient to call back and schedule Medicare Annual Wellness Visit (AWV).   Please offer to do virtually or by telephone.  Left office number and my jabber #[phone].  AWVI eligible as of  04/06/2009  Please schedule at anytime with Nurse Health Advisor.   

## 2021-09-04 NOTE — Telephone Encounter (Signed)
Patient notified that test was normal.

## 2021-09-22 DIAGNOSIS — R040 Epistaxis: Secondary | ICD-10-CM | POA: Diagnosis not present

## 2021-10-28 ENCOUNTER — Ambulatory Visit (HOSPITAL_BASED_OUTPATIENT_CLINIC_OR_DEPARTMENT_OTHER)
Admission: RE | Admit: 2021-10-28 | Discharge: 2021-10-28 | Disposition: A | Discharge status: Home / Self Care | Payer: Medicare Other | Source: Ambulatory Visit | Attending: Family Medicine | Admitting: Family Medicine

## 2021-10-28 ENCOUNTER — Encounter: Payer: Self-pay | Admitting: Family Medicine

## 2021-10-28 ENCOUNTER — Ambulatory Visit (INDEPENDENT_AMBULATORY_CARE_PROVIDER_SITE_OTHER): Payer: Medicare Other | Admitting: Family Medicine

## 2021-10-28 VITALS — BP 130/86 | HR 73 | Temp 98.1°F | Resp 18 | Ht 64.0 in | Wt 145.8 lb

## 2021-10-28 DIAGNOSIS — H5711 Ocular pain, right eye: Secondary | ICD-10-CM

## 2021-10-28 DIAGNOSIS — M79662 Pain in left lower leg: Secondary | ICD-10-CM | POA: Diagnosis not present

## 2021-10-28 DIAGNOSIS — K625 Hemorrhage of anus and rectum: Secondary | ICD-10-CM

## 2021-10-28 NOTE — Patient Instructions (Signed)
Liver Laceration  A liver laceration is a tear or a cut in the liver, an organ with many important functions in the body. Sometimes, a liver laceration can be a very serious injury as it can cause a lot of bleeding, and surgery may be needed. Other times, a liver laceration may be minor, and bed rest may be all that is needed. Either way, evaluation and treatment in a hospital is almost always required. Liver lacerations are categorized as Grades 1 to 5, with Grade 1 being the least severe and Grade 5 being the most serious. What are the causes? This condition may be caused by: A forceful hit to the area around the liver (blunt trauma), such as from a car crash. Blunt trauma can tear the liver even though it does not break the skin. An injury in which an object goes through the skin and into the liver (penetrating injury), such as a stab or gunshot wound. What are the signs or symptoms? Common symptoms of this condition include: A swollen and firm abdomen. Pain in the abdomen. Tenderness when pressing on the abdomen, especially on the right side of abdomen where the liver is located. Other symptoms include: Bleeding from a penetrating wound. Bruises on the abdomen. A fast heartbeat. Taking quick breaths. Feeling weak and dizzy. Looking pale due to blood loss. How is this diagnosed? This condition is diagnosed with a physical exam and medical history. Your health care provider may ask about any injuries to your chest or abdomen. You may also have tests, such as: Blood tests. Your blood may be tested every few hours to see if you are losing blood. Ultrasound. This may be done to look for blood in the abdomen. CT scan. This may be done to look for blood in the abdomen. Laparoscopy. This involves placing a small camera into the abdomen and looking directly at the surface of the liver. How is this treated? Treatment for this condition depends on the depth of the laceration, how much bleeding  you have, and your overall condition. Treatment may include: Monitoring and bed rest at a hospital. Receiving donated blood through an IV (transfusion) to replace the blood that you have lost. You may need several transfusions. Surgery to: Pack surgical gauze pads around the laceration to control the bleeding. Repair any injured blood vessels in your liver. Stop the bleeding from the arteries in the liver. Follow these instructions at home: Wound care If you have a wound from a penetrating injury, follow instructions from your health care provider about how to take care of your wound. Make sure you: Wash your hands with soap and water for at least 20 seconds before and after you change your bandage (dressing). If soap and water are not available, use hand sanitizer. Change your dressing as told by your health care provider. Leave stitches (sutures), skin glue, or adhesive strips in place. These skin closures may need to stay in place for 2 weeks or longer. If adhesive strip edges start to loosen and curl up, you may trim the loose edges. Do not remove adhesive strips completely unless your health care provider tells you to do that. If you have tubes placed in the wound to drain out fluids (drains), make sure to take care of them as told by your health care provider. Check your wound every day for signs of infection. Check for: More redness, warmth, swelling, tenderness, or pain. More fluid or blood that drains from the wound. Pus or a bad  smell. Stitches or wounds that seem to be opening up (dehiscence). Do not take baths, swim, or use a hot tub until your health care provider approves. Ask your health care provider if you may take showers. You may only be allowed to take sponge baths. Activity  Rest as told by your health care provider. Ask your health care provider what activities are safe for you. Do not lift anything that is heavier than 10 lb (4.5 kg), or the limit that you are told,  until your health care provider says that it is safe. Do not do activities that involve physical contact or require extra energy until your health care provider says that it is safe. General instructions  Take over-the-counter and prescription medicines only as told by your health care provider. Do not take any other medicines unless you ask your health care provider about them first. Ask your health care provider if the medicine prescribed to you requires you to avoid driving or using machinery. Do not use any products that contain nicotine or tobacco, such as cigarettes, e-cigarettes, and chewing tobacco. These can delay healing. If you need help quitting, ask your health care provider. Keep all follow-up visits as told by your health care provider. This is important. Contact a health care provider if: Your abdominal pain does not go away. You feel more weak and tired than usual. You have a fever. Get help right away if: Your abdominal pain gets worse. You have trouble breathing. You feel dizzy or very weak. You feel confused or disoriented. You have a wound on your skin that: Has more redness, swelling, or pain around it. Has more fluid or blood coming from it. Feels warm to the touch. Has pus or a bad smell coming from it. Has stitches or areas that seem to be opening up (dehiscence). Summary A liver laceration is a tear or a cut in the liver. A liver laceration can be a very serious injury that requires surgery to control bleeding. Tests such as ultrasound or CT scans may be done to look at blood in the abdomen. Treatment for this condition depends on how deep the laceration is, how much bleeding you have, and your overall condition. This information is not intended to replace advice given to you by your health care provider. Make sure you discuss any questions you have with your health care provider. Document Revised: 03/10/2019 Document Reviewed: 03/10/2019 Elsevier Patient  Education  [place].

## 2021-10-28 NOTE — Progress Notes (Signed)
Subjective:   By signing my name below, I, Alexandria [name], attest that this documentation has been prepared under the direction and in the presence of    Patient ID: Alexandria [name], female    DOB: [birth year], 76 y.o.   MRN: [id number]  Chief Complaint  Patient presents with   Eye Problem    X1 month, pt states banging in wood at [facility] and states going to her eye doctor. Eye doctor stated there was fluid on her eye and gave her drops. Pt states eye socket is sore.     Eye Problem  Pertinent negatives include no blurred vision, fever or nausea.   Patient is in today for follow up.  She complains of pain in her right eye. She ran into a piece of wood at the store and hit her eye. After seeing the ophthalmologist she was prescribed prednisone eye drops. She has been having edema in her left lower extremely. She adds that she has a on and off painful bruising on her left leg.   She has a colonoscopy on July 28th 2023.   Past Medical History:  Diagnosis Date   Arthritis    Asthmatic bronchitis with acute exacerbation 02/06/2015   Bronchial pneumonia    Colon polyp    Diverticulitis    DVT (deep venous thrombosis) (HCC)    lower extremity   H/O cardiac catheterization 2004   Normal coronary arteries   Heart murmur    History of stress test 05/21/2011   Hx of echocardiogram 01/27/2010   Normal Ef 55% the transmitral spectral doppler flow pattern is normal for age. the left ventricular wall motion is normal   IPF (idiopathic pulmonary fibrosis) ([place]) 01/2018   Migraines    Pancreatitis    Thyroid disease    TIA (transient ischemic attack)    8 years ago    Past Surgical History:  Procedure Laterality Date   BREAST BIOPSY     [facility]   CARDIAC CATHETERIZATION     10/2013   CATARACT EXTRACTION Right 03/22/2018   LEFT HEART CATHETERIZATION WITH CORONARY ANGIOGRAM N/A 10/25/2013   Procedure: LEFT HEART CATHETERIZATION WITH CORONARY ANGIOGRAM;  Surgeon: Troy Sine,  MD;  Location: [facility] CATH LAB;  Service: Cardiovascular;  Laterality: N/A;   NECK SURGERY     x's 2   TOTAL KNEE ARTHROPLASTY     Bilateral x's 2   VAGINAL HYSTERECTOMY  10/17/1998   [name]   VIDEO BRONCHOSCOPY Bilateral 01/24/2018   Procedure: VIDEO BRONCHOSCOPY WITH FLUORO;  Surgeon: [name], MD;  Location: [place];  Service: Cardiopulmonary;  Laterality: Bilateral;    Family History  Problem Relation Age of Onset   Arthritis Other    Colon cancer Father    Prostate cancer Father    HIV Brother        1982   Kidney cancer Brother    Colon cancer Brother    Lung cancer Brother    Other Brother        Mouth Cancer   Ovarian cancer Mother    Uterine cancer Mother    Heart disease Maternal Grandmother    Stroke Maternal Grandmother    Hypertension Maternal Grandmother    Diabetes Maternal Grandmother     Social History   Socioeconomic History   Marital status: Married    Spouse name: [name]   Number of children: 3   Years of education: [employer]   Highest education level: Not on file  Occupational History   Occupation: Tour manager: UNEMPLOYED  Tobacco Use   Smoking status: Never   Smokeless tobacco: Never  Vaping Use   Vaping Use: Never used  Substance and Sexual Activity   Alcohol use: No    Alcohol/week: 0.0 standard drinks of alcohol   Drug use: No   Sexual activity: Not on file  Other Topics Concern   Not on file  Social History Narrative   Lives with husband [name]   Caffeine use: coffee (2 cups per day)   Mostly right-handed   Social Determinants of Health   Financial Resource Strain: Not on file  Food Insecurity: Not on file  Transportation Needs: Not on file  Physical Activity: Not on file  Stress: Not on file  Social Connections: Not on file  Intimate Partner Violence: Not on file    Outpatient Medications Prior to Visit  Medication Sig Dispense Refill   benzonatate (TESSALON) 100 MG capsule Take 2 capsules (200 mg  total) by mouth 3 (three) times daily as needed for cough. 60 capsule 2   Cholecalciferol (VITAMIN D3) 125 MCG (5000 UT) CAPS Take 5,000 Units by mouth daily.     clopidogrel (PLAVIX) 75 MG tablet Take 1 tablet (75 mg total) by mouth daily. 90 tablet 3   furosemide (LASIX) 40 MG tablet TAKE 1 TABLET BY MOUTH TWICE A DAY 180 tablet 1   KLOR-CON M20 20 MEQ tablet TAKE 1 TABLET (20 MEQ TOTAL) BY MOUTH 2 (TWO) TIMES DAILY. WILL TAKE 3 A DAILY ONLY AS NEEDED. 270 tablet 1   Multiple Vitamin (MULTIVITAMIN) tablet Take 1 tablet by mouth every other day. Gluten free/Vegetarian Multivitamin     Nintedanib (OFEV) 100 MG CAPS Take by mouth.     ondansetron (ZOFRAN-ODT) 4 MG disintegrating tablet TAKE 1 TABLET BY MOUTH EVERY 8 HOURS AS NEEDED FOR NAUSEA AND VOMITING 60 tablet 2   SYNTHROID 112 MCG tablet TAKE 1 TABLET BY MOUTH EVERY DAY 90 tablet 1   vitamin C (ASCORBIC ACID) 500 MG tablet Take 1,000 mg by mouth 2 (two) times daily.      vitamin E 1000 UNIT capsule Take 1,000 Units by mouth daily.     No facility-administered medications prior to visit.    Allergies  Allergen Reactions   Prednisone Other (See Comments)    Changed personality    Atrovent Hfa [Ipratropium Bromide Hfa] Other (See Comments)    Pt could not sleep   Cheratussin Ac [Guaifenesin-Codeine]    Codeine Other (See Comments)    Pt takes promethazine with codeine at home   Hydrocodone Itching   Influenza Vaccines Other (See Comments)    Pt reports heart attack after last flu shot   Motrin [Ibuprofen] Other (See Comments)    "Gives false reading in blood"   Oxycodone-Acetaminophen Itching    Review of Systems  Constitutional:  Negative for fever and malaise/fatigue.  HENT:  Negative for congestion.   Eyes:  Positive for pain (right). Negative for blurred vision.  Respiratory:  Negative for shortness of breath.   Cardiovascular:  Negative for chest pain, palpitations and leg swelling.  Gastrointestinal:  Negative for  abdominal pain, blood in stool and nausea.  Genitourinary:  Negative for dysuria and frequency.  Musculoskeletal:  Negative for falls.  Skin:  Negative for rash.  Neurological:  Negative for dizziness, loss of consciousness and headaches.  Endo/Heme/Allergies:  Negative for environmental allergies. Bruises/bleeds easily (left leg).  Psychiatric/Behavioral:  Negative for depression. The  patient is not nervous/anxious.        Objective:    Physical Exam Vitals and nursing note reviewed.  Constitutional:      Appearance: Normal appearance.  HENT:     Head: Normocephalic and atraumatic.     Right Ear: External ear normal.     Left Ear: External ear normal.  Eyes:     Extraocular Movements: Extraocular movements intact.     Pupils: Pupils are equal, round, and reactive to light.  Cardiovascular:     Rate and Rhythm: Normal rate and regular rhythm.     Pulses: Normal pulses.     Heart sounds: Normal heart sounds. No murmur heard.    No gallop.  Pulmonary:     Effort: Pulmonary effort is normal. No respiratory distress.     Breath sounds: Normal breath sounds. No wheezing or rales.  Skin:    General: Skin is warm and dry.  Neurological:     Mental Status: She is alert and oriented to person, place, and time.  Psychiatric:        Judgment: Judgment normal.     BP 130/86 (BP Location: Left Arm, Patient Position: Sitting, Cuff Size: Normal)   Pulse 73   Temp 98.1 F (36.7 C) (Oral)   Resp 18   Ht $R'5\' 4"'Hl$  (1.626 m)   Wt 145 lb 12.8 oz (66.1 kg)   SpO2 98%   BMI 25.03 kg/m  Wt Readings from Last 3 Encounters:  11/13/21 143 lb (64.9 kg)  10/28/21 145 lb 12.8 oz (66.1 kg)  08/22/21 144 lb 12.8 oz (65.7 kg)    Diabetic Foot Exam - Simple   No data filed    Lab Results  Component Value Date   WBC 7.3 10/28/2021   HGB 12.9 10/28/2021   HCT 38.6 10/28/2021   PLT 221.0 10/28/2021   GLUCOSE 86 08/22/2021   CHOL 196 03/18/2020   TRIG 91.0 03/18/2020   HDL 58.70  03/18/2020   LDLCALC 119 (H) 03/18/2020   ALT 18 08/22/2021   AST 19 08/22/2021   NA 142 08/22/2021   K 3.8 08/22/2021   CL 102 08/22/2021   CREATININE 0.77 08/22/2021   BUN 16 08/22/2021   CO2 31 08/22/2021   TSH 1.22 08/22/2021   INR 1.1 (H) 05/07/2015   HGBA1C 5.4 03/18/2020    Lab Results  Component Value Date   TSH 1.22 08/22/2021   Lab Results  Component Value Date   WBC 7.3 10/28/2021   HGB 12.9 10/28/2021   HCT 38.6 10/28/2021   MCV 98.4 10/28/2021   PLT 221.0 10/28/2021   Lab Results  Component Value Date   NA 142 08/22/2021   K 3.8 08/22/2021   CO2 31 08/22/2021   GLUCOSE 86 08/22/2021   BUN 16 08/22/2021   CREATININE 0.77 08/22/2021   BILITOT 0.5 08/22/2021   ALKPHOS 78 08/22/2021   AST 19 08/22/2021   ALT 18 08/22/2021   PROT 7.2 08/22/2021   ALBUMIN 4.0 08/22/2021   CALCIUM 9.4 08/22/2021   ANIONGAP 12 11/13/2019   EGFR 72 03/21/2021   GFR 74.79 08/22/2021   Lab Results  Component Value Date   CHOL 196 03/18/2020   Lab Results  Component Value Date   HDL 58.70 03/18/2020   Lab Results  Component Value Date   LDLCALC 119 (H) 03/18/2020   Lab Results  Component Value Date   TRIG 91.0 03/18/2020   Lab Results  Component Value Date   CHOLHDL  3 03/18/2020   Lab Results  Component Value Date   HGBA1C 5.4 03/18/2020       Assessment & Plan:   Problem List Items Addressed This Visit       Unprioritized   Pain of left calf   Relevant Orders   US Venous Img Lower Unilateral Left (DVT) (Completed)   Rectal bleed   Relevant Orders   CBC with Differential/Platelet (Completed)   IBC panel (Completed)   Other Visit Diagnoses     Pain of right orbit    -  Primary   Relevant Orders   DG Orbits (Completed)        No orders of the defined types were placed in this encounter.   IAnn Held, DO, personally preformed the services described in this documentation.  All medical record entries made by the scribe were at  my direction and in my presence.  I have reviewed the chart and discharge instructions (if applicable) and agree that the record reflects my personal performance and is accurate and complete. 10/28/2021   I,Tinashe Williams,acting as a scribe for [name], DO.,have documented all relevant documentation on the behalf of [name], DO,as directed by  [name], DO while in the presence of [name], [place], [place]

## 2021-10-29 ENCOUNTER — Ambulatory Visit (HOSPITAL_BASED_OUTPATIENT_CLINIC_OR_DEPARTMENT_OTHER)
Admission: RE | Admit: 2021-10-29 | Discharge: 2021-10-29 | Disposition: A | Discharge status: Home / Self Care | Payer: Medicare Other | Source: Ambulatory Visit | Attending: Family Medicine | Admitting: Family Medicine

## 2021-10-29 DIAGNOSIS — M79662 Pain in left lower leg: Secondary | ICD-10-CM | POA: Diagnosis not present

## 2021-10-29 LAB — CBC WITH DIFFERENTIAL/PLATELET
Basophils Absolute: 0.1 10*3/uL (ref 0.0–0.1)
Basophils Relative: 1 % (ref 0.0–3.0)
Eosinophils Absolute: 0.4 10*3/uL (ref 0.0–0.7)
Eosinophils Relative: 6.2 % — ABNORMAL HIGH (ref 0.0–5.0)
HCT: 38.6 % (ref 36.0–46.0)
Hemoglobin: 12.9 g/dL (ref 12.0–15.0)
Lymphocytes Relative: 27.1 % (ref 12.0–46.0)
Lymphs Abs: 2 10*3/uL (ref 0.7–4.0)
MCHC: 33.4 g/dL (ref 30.0–36.0)
MCV: 98.4 fl (ref 78.0–100.0)
Monocytes Absolute: 1 10*3/uL (ref 0.1–1.0)
Monocytes Relative: 13.7 % — ABNORMAL HIGH (ref 3.0–12.0)
Neutro Abs: 3.8 10*3/uL (ref 1.4–7.7)
Neutrophils Relative %: 52 % (ref 43.0–77.0)
Platelets: 221 10*3/uL (ref 150.0–400.0)
RBC: 3.92 Mil/uL (ref 3.87–5.11)
RDW: 13.7 % (ref 11.5–15.5)
WBC: 7.3 10*3/uL (ref 4.0–10.5)

## 2021-10-29 LAB — IBC PANEL
Iron: 50 ug/dL (ref 42–145)
Saturation Ratios: 15.4 % — ABNORMAL LOW (ref 20.0–50.0)
TIBC: 324.8 ug/dL (ref 250.0–450.0)
Transferrin: 232 mg/dL (ref 212.0–360.0)

## 2021-10-31 DIAGNOSIS — K51811 Other ulcerative colitis with rectal bleeding: Secondary | ICD-10-CM | POA: Diagnosis not present

## 2021-10-31 DIAGNOSIS — K6389 Other specified diseases of intestine: Secondary | ICD-10-CM | POA: Diagnosis not present

## 2021-10-31 DIAGNOSIS — K5730 Diverticulosis of large intestine without perforation or abscess without bleeding: Secondary | ICD-10-CM | POA: Diagnosis not present

## 2021-10-31 DIAGNOSIS — K921 Melena: Secondary | ICD-10-CM | POA: Diagnosis not present

## 2021-10-31 DIAGNOSIS — K5289 Other specified noninfective gastroenteritis and colitis: Secondary | ICD-10-CM | POA: Diagnosis not present

## 2021-10-31 DIAGNOSIS — K648 Other hemorrhoids: Secondary | ICD-10-CM | POA: Diagnosis not present

## 2021-10-31 DIAGNOSIS — K5900 Constipation, unspecified: Secondary | ICD-10-CM | POA: Diagnosis not present

## 2021-10-31 DIAGNOSIS — K5700 Diverticulitis of small intestine with perforation and abscess without bleeding: Secondary | ICD-10-CM | POA: Diagnosis not present

## 2021-11-06 ENCOUNTER — Telehealth: Payer: Self-pay | Admitting: Family Medicine

## 2021-11-06 NOTE — Telephone Encounter (Signed)
Pt called to go over labs. She stated pain in her legs are getting worse. Please advise.

## 2021-11-06 NOTE — Telephone Encounter (Signed)
Pt states still having pain and unable to walk upstairs. Please advise

## 2021-11-07 DIAGNOSIS — K649 Unspecified hemorrhoids: Secondary | ICD-10-CM | POA: Diagnosis not present

## 2021-11-10 ENCOUNTER — Other Ambulatory Visit: Payer: Self-pay

## 2021-11-10 DIAGNOSIS — M79662 Pain in left lower leg: Secondary | ICD-10-CM

## 2021-11-10 NOTE — Telephone Encounter (Signed)
Referral placed.

## 2021-11-12 DIAGNOSIS — K6389 Other specified diseases of intestine: Secondary | ICD-10-CM | POA: Diagnosis not present

## 2021-11-12 DIAGNOSIS — K8689 Other specified diseases of pancreas: Secondary | ICD-10-CM | POA: Diagnosis not present

## 2021-11-12 DIAGNOSIS — K5730 Diverticulosis of large intestine without perforation or abscess without bleeding: Secondary | ICD-10-CM | POA: Diagnosis not present

## 2021-11-12 DIAGNOSIS — I700 Atherosclerosis of aorta: Secondary | ICD-10-CM | POA: Diagnosis not present

## 2021-11-12 DIAGNOSIS — K529 Noninfective gastroenteritis and colitis, unspecified: Secondary | ICD-10-CM | POA: Diagnosis not present

## 2021-11-12 DIAGNOSIS — K625 Hemorrhage of anus and rectum: Secondary | ICD-10-CM | POA: Diagnosis not present

## 2021-11-13 ENCOUNTER — Ambulatory Visit: Payer: Self-pay

## 2021-11-13 ENCOUNTER — Ambulatory Visit (INDEPENDENT_AMBULATORY_CARE_PROVIDER_SITE_OTHER): Payer: Medicare Other | Admitting: Family Medicine

## 2021-11-13 ENCOUNTER — Encounter: Payer: Self-pay | Admitting: Family Medicine

## 2021-11-13 ENCOUNTER — Ambulatory Visit (HOSPITAL_BASED_OUTPATIENT_CLINIC_OR_DEPARTMENT_OTHER)
Admission: RE | Admit: 2021-11-13 | Discharge: 2021-11-13 | Disposition: A | Discharge status: Home / Self Care | Payer: Medicare Other | Source: Ambulatory Visit | Attending: Family Medicine | Admitting: Family Medicine

## 2021-11-13 ENCOUNTER — Telehealth: Payer: Self-pay | Admitting: *Deleted

## 2021-11-13 VITALS — BP 150/98 | Ht 64.0 in | Wt 143.0 lb

## 2021-11-13 DIAGNOSIS — M25562 Pain in left knee: Secondary | ICD-10-CM | POA: Diagnosis not present

## 2021-11-13 DIAGNOSIS — G8929 Other chronic pain: Secondary | ICD-10-CM

## 2021-11-13 DIAGNOSIS — Z96652 Presence of left artificial knee joint: Secondary | ICD-10-CM | POA: Insufficient documentation

## 2021-11-13 DIAGNOSIS — M25872 Other specified joint disorders, left ankle and foot: Secondary | ICD-10-CM

## 2021-11-13 DIAGNOSIS — Z96642 Presence of left artificial hip joint: Secondary | ICD-10-CM | POA: Diagnosis not present

## 2021-11-13 NOTE — Telephone Encounter (Signed)
-----   Message from Elberta Spaniel sent at 11/13/2021 12:01 PM EDT ----- Regarding: Patient wants [facility] not [facility] Pt cld back states she wants  [facility] , doesn't want to go to [facility]

## 2021-11-13 NOTE — Telephone Encounter (Signed)
Left message for pt to call back. Updated PT order faxed to renew wellness PT at [facility].

## 2021-11-13 NOTE — Assessment & Plan Note (Signed)
Acute on chronic nature.  Arthroplasty is roughly 76 years old.  She does have weakness with hip abduction which is contributing as well as ankle impingement in the left ankle. -Counseled on home exercise therapy and supportive care. -Referral to physical therapy. -X-ray. -Could consider aspiration or further imaging.

## 2021-11-13 NOTE — Assessment & Plan Note (Signed)
Acutely occurring.  Has medial translation of the ankle joint. -Counseled on home exercise therapy and supportive care. -Green sport insoles with scaphoid pads. -Referral to physical therapy. -Can pursuing orthotics

## 2021-11-13 NOTE — Patient Instructions (Signed)
Nice to meet you Please try the exercise and insoles  I have made a referral to physical therapy  I will call with the xray results.   Please send me a message in MyChart with any questions or updates.  Please see me back to have orthotics made.   --Dr. Raeford Razor

## 2021-11-13 NOTE — Progress Notes (Signed)
  Alexandria [name] - 76 y.o. female MRN [id number]  Date of birth: 06-Sep-1945  SUBJECTIVE:  Including CC & ROS.  No chief complaint on file.   Alexandria [name] is a 76 y.o. female that is presenting with acute on chronic left knee pain.  She has a history of arthroplasty from the 90s.  Pain is over the lateral aspect.  She gets catching from time to time.   Review of Systems See HPI   HISTORY: Past Medical, Surgical, Social, and Family History Reviewed & Updated per EMR.   Pertinent Historical Findings include:  Past Medical History:  Diagnosis Date   Arthritis    Asthmatic bronchitis with acute exacerbation 02/06/2015   Bronchial pneumonia    Colon polyp    Diverticulitis    DVT (deep venous thrombosis) (HCC)    lower extremity   H/O cardiac catheterization 2004   Normal coronary arteries   Heart murmur    History of stress test 05/21/2011   Hx of echocardiogram 01/27/2010   Normal Ef 55% the transmitral spectral doppler flow pattern is normal for age. the left ventricular wall motion is normal   IPF (idiopathic pulmonary fibrosis) ([place]) 01/2018   Migraines    Pancreatitis    Thyroid disease    TIA (transient ischemic attack)    8 years ago    Past Surgical History:  Procedure Laterality Date   BREAST BIOPSY     [facility]   CARDIAC CATHETERIZATION     10/2013   CATARACT EXTRACTION Right 03/22/2018   LEFT HEART CATHETERIZATION WITH CORONARY ANGIOGRAM N/A 10/25/2013   Procedure: LEFT HEART CATHETERIZATION WITH CORONARY ANGIOGRAM;  Surgeon: Troy Sine, MD;  Location: [facility] CATH LAB;  Service: Cardiovascular;  Laterality: N/A;   NECK SURGERY     x's 2   TOTAL KNEE ARTHROPLASTY     Bilateral x's 2   VAGINAL HYSTERECTOMY  10/17/1998   [name]   VIDEO BRONCHOSCOPY Bilateral 01/24/2018   Procedure: VIDEO BRONCHOSCOPY WITH FLUORO;  Surgeon: [name], MD;  Location: [place];  Service: Cardiopulmonary;  Laterality: Bilateral;     PHYSICAL EXAM:  VS:  BP (!) 150/98 (BP Location: Left Arm, Patient Position: Sitting)   Ht '5\' 4"'$  (1.626 m)   Wt 143 lb (64.9 kg)   BMI 24.55 kg/m  Physical Exam Gen: NAD, alert, cooperative with exam, well-appearing MSK:  Neurovascularly intact    Limited ultrasound: Left knee:  Effusion within the suprapatellar pouch. No significant changes of the quadricep and patellar tendon. There appears to be hyperemia and thickening of the lateral retinaculum. Hyperemia associated with the lateral femoral condyle.  Summary: Arthroplasty with findings of irritation of the lateral compartment  Ultrasound and interpretation by Clearance Coots, MD    ASSESSMENT & PLAN:   Ankle impingement syndrome, left Acutely occurring.  Has medial translation of the ankle joint. -Counseled on home exercise therapy and supportive care. -Green sport insoles with scaphoid pads. -Referral to physical therapy. -Can pursuing orthotics  S/P total knee arthroplasty, left Acute on chronic nature.  Arthroplasty is roughly 76 years old.  She does have weakness with hip abduction which is contributing as well as ankle impingement in the left ankle. -Counseled on home exercise therapy and supportive care. -Referral to physical therapy. -X-ray. -Could consider aspiration or further imaging.

## 2021-11-14 ENCOUNTER — Telehealth: Payer: Self-pay | Admitting: Family Medicine

## 2021-11-14 NOTE — Telephone Encounter (Signed)
Informed on results  Rosemarie Ax, MD [facility] 11/14/2021, 8:22 AM

## 2021-11-15 DIAGNOSIS — H5711 Ocular pain, right eye: Secondary | ICD-10-CM | POA: Insufficient documentation

## 2021-11-15 NOTE — Assessment & Plan Note (Signed)
Pt saw opth Check xray  F/u opth

## 2021-11-15 NOTE — Assessment & Plan Note (Signed)
Hx of--- recheck labs

## 2021-11-15 NOTE — Assessment & Plan Note (Signed)
[place] neg for dvt  F/u sport med

## 2021-11-19 ENCOUNTER — Encounter: Payer: Self-pay | Admitting: Family Medicine

## 2021-11-19 ENCOUNTER — Ambulatory Visit (INDEPENDENT_AMBULATORY_CARE_PROVIDER_SITE_OTHER): Payer: Medicare Other | Admitting: Family Medicine

## 2021-11-19 VITALS — Ht 64.0 in | Wt 143.0 lb

## 2021-11-19 DIAGNOSIS — M2142 Flat foot [pes planus] (acquired), left foot: Secondary | ICD-10-CM

## 2021-11-19 DIAGNOSIS — M2141 Flat foot [pes planus] (acquired), right foot: Secondary | ICD-10-CM | POA: Diagnosis not present

## 2021-11-19 DIAGNOSIS — M2140 Flat foot [pes planus] (acquired), unspecified foot: Secondary | ICD-10-CM | POA: Insufficient documentation

## 2021-11-19 NOTE — Assessment & Plan Note (Signed)
Completed orthotics today.  Could consider adding scaphoid pads.

## 2021-11-19 NOTE — Progress Notes (Signed)
  Alexandria [name] - 76 y.o. female MRN [id number]  Date of birth: 12/27/1945  SUBJECTIVE:  Including CC & ROS.  No chief complaint on file.   Alexandria [name] is a 76 y.o. female that is here for orthotics.    Review of Systems See HPI   HISTORY: Past Medical, Surgical, Social, and Family History Reviewed & Updated per EMR.   Pertinent Historical Findings include:  Past Medical History:  Diagnosis Date   Arthritis    Asthmatic bronchitis with acute exacerbation 02/06/2015   Bronchial pneumonia    Colon polyp    Diverticulitis    DVT (deep venous thrombosis) (HCC)    lower extremity   H/O cardiac catheterization 2004   Normal coronary arteries   Heart murmur    History of stress test 05/21/2011   Hx of echocardiogram 01/27/2010   Normal Ef 55% the transmitral spectral doppler flow pattern is normal for age. the left ventricular wall motion is normal   IPF (idiopathic pulmonary fibrosis) ([place]) 01/2018   Migraines    Pancreatitis    Thyroid disease    TIA (transient ischemic attack)    8 years ago    Past Surgical History:  Procedure Laterality Date   BREAST BIOPSY     [facility]   CARDIAC CATHETERIZATION     10/2013   CATARACT EXTRACTION Right 03/22/2018   LEFT HEART CATHETERIZATION WITH CORONARY ANGIOGRAM N/A 10/25/2013   Procedure: LEFT HEART CATHETERIZATION WITH CORONARY ANGIOGRAM;  Surgeon: Troy Sine, MD;  Location[facility] C[facility] LAB;  Service: Cardiovascular;  Laterality: N/A;   NECK SURGERY     x's 2   TOTAL KNEE ARTHROPLASTY     Bilateral x's 2   VAGINAL HYSTERECTOMY  10/17/1998   [name]   VIDEO BRONCHOSCOPY Bilateral 01/24/2018   Procedure: VIDEO BRONCHOSCOPY WITH FLUORO;  Surgeon: [name], MD;  Location: [place];  Service: Cardiopulmonary;  Laterality: Bilateral;     PHYSICAL EXAM:  VS: Ht '5\' 4"'$  (1.626 m)   Wt 143 lb (64.9 kg)   BMI 24.55 kg/m  Physical Exam Gen: NAD, alert, cooperative with exam, well-appearing MSK:   Neurovascularly intact    Patient was fitted for a standard, cushioned, semi-rigid orthotic. The orthotic was heated and afterward the patient stood on the orthotic blank positioned on the orthotic stand. The patient was positioned in subtalar neutral position and 10 degrees of ankle dorsiflexion in a weight bearing stance. After completion of molding, a stable base was applied to the orthotic blank. The blank was ground to a stable position for weight bearing. Size: 8 Pairs: 2 Base: Blue EVA Additional Posting and Padding: first ray post The patient ambulated these, and they were very comfortable.     ASSESSMENT & PLAN:   Flat foot Completed orthotics today.  Could consider adding scaphoid pads.

## 2021-11-26 DIAGNOSIS — M546 Pain in thoracic spine: Secondary | ICD-10-CM | POA: Diagnosis not present

## 2021-11-27 ENCOUNTER — Ambulatory Visit: Payer: Medicare Other | Admitting: Physical Therapy

## 2021-11-28 DIAGNOSIS — K640 First degree hemorrhoids: Secondary | ICD-10-CM | POA: Diagnosis not present

## 2021-12-03 ENCOUNTER — Ambulatory Visit: Payer: Medicare Other | Admitting: Family Medicine

## 2021-12-03 ENCOUNTER — Ambulatory Visit: Payer: Medicare Other | Admitting: Physical Therapy

## 2021-12-03 DIAGNOSIS — M546 Pain in thoracic spine: Secondary | ICD-10-CM | POA: Diagnosis not present

## 2021-12-04 DIAGNOSIS — J84112 Idiopathic pulmonary fibrosis: Secondary | ICD-10-CM | POA: Diagnosis not present

## 2021-12-04 DIAGNOSIS — Z87891 Personal history of nicotine dependence: Secondary | ICD-10-CM | POA: Diagnosis not present

## 2021-12-09 ENCOUNTER — Telehealth: Payer: Self-pay | Admitting: Family Medicine

## 2021-12-09 ENCOUNTER — Encounter: Payer: Medicare Other | Admitting: Physical Therapy

## 2021-12-09 NOTE — Telephone Encounter (Signed)
Labs faxed

## 2021-12-09 NOTE — Telephone Encounter (Signed)
Patient would like her last lab workup to be sent to [facility] at fax number: [phone] Dr. [name]. Please advise.

## 2021-12-10 DIAGNOSIS — J479 Bronchiectasis, uncomplicated: Secondary | ICD-10-CM | POA: Diagnosis not present

## 2021-12-10 DIAGNOSIS — R140 Abdominal distension (gaseous): Secondary | ICD-10-CM | POA: Diagnosis not present

## 2021-12-10 DIAGNOSIS — I700 Atherosclerosis of aorta: Secondary | ICD-10-CM | POA: Diagnosis not present

## 2021-12-10 DIAGNOSIS — J84112 Idiopathic pulmonary fibrosis: Secondary | ICD-10-CM | POA: Diagnosis not present

## 2021-12-10 DIAGNOSIS — J941 Fibrothorax: Secondary | ICD-10-CM | POA: Diagnosis not present

## 2021-12-10 DIAGNOSIS — K5900 Constipation, unspecified: Secondary | ICD-10-CM | POA: Diagnosis not present

## 2021-12-12 ENCOUNTER — Encounter: Payer: Medicare Other | Admitting: Physical Therapy

## 2021-12-16 ENCOUNTER — Encounter: Payer: Medicare Other | Admitting: Physical Therapy

## 2021-12-16 ENCOUNTER — Encounter: Payer: Medicare Other | Admitting: Family Medicine

## 2021-12-17 DIAGNOSIS — M546 Pain in thoracic spine: Secondary | ICD-10-CM | POA: Diagnosis not present

## 2021-12-19 ENCOUNTER — Encounter: Payer: Medicare Other | Admitting: Physical Therapy

## 2021-12-19 DIAGNOSIS — Z7902 Long term (current) use of antithrombotics/antiplatelets: Secondary | ICD-10-CM | POA: Diagnosis not present

## 2021-12-19 DIAGNOSIS — J3489 Other specified disorders of nose and nasal sinuses: Secondary | ICD-10-CM | POA: Diagnosis not present

## 2021-12-19 DIAGNOSIS — Z87891 Personal history of nicotine dependence: Secondary | ICD-10-CM | POA: Diagnosis not present

## 2021-12-19 DIAGNOSIS — K921 Melena: Secondary | ICD-10-CM | POA: Diagnosis not present

## 2021-12-19 DIAGNOSIS — J849 Interstitial pulmonary disease, unspecified: Secondary | ICD-10-CM | POA: Diagnosis not present

## 2021-12-19 DIAGNOSIS — J342 Deviated nasal septum: Secondary | ICD-10-CM | POA: Diagnosis not present

## 2021-12-19 DIAGNOSIS — Z8673 Personal history of transient ischemic attack (TIA), and cerebral infarction without residual deficits: Secondary | ICD-10-CM | POA: Diagnosis not present

## 2021-12-19 DIAGNOSIS — M950 Acquired deformity of nose: Secondary | ICD-10-CM | POA: Diagnosis not present

## 2021-12-19 DIAGNOSIS — I714 Abdominal aortic aneurysm, without rupture, unspecified: Secondary | ICD-10-CM | POA: Diagnosis not present

## 2021-12-20 DIAGNOSIS — M546 Pain in thoracic spine: Secondary | ICD-10-CM | POA: Diagnosis not present

## 2021-12-23 ENCOUNTER — Encounter: Payer: Medicare Other | Admitting: Physical Therapy

## 2021-12-30 ENCOUNTER — Telehealth: Payer: Self-pay | Admitting: Family Medicine

## 2021-12-30 ENCOUNTER — Other Ambulatory Visit: Payer: Self-pay | Admitting: Family Medicine

## 2021-12-30 DIAGNOSIS — R42 Dizziness and giddiness: Secondary | ICD-10-CM

## 2021-12-30 DIAGNOSIS — E876 Hypokalemia: Secondary | ICD-10-CM

## 2021-12-30 NOTE — Telephone Encounter (Signed)
Orders in. Pt needs a lab appointment please

## 2021-12-30 NOTE — Telephone Encounter (Signed)
Patient wants to know if she can have some labs drawn, she believes her potassium might be low. "When she stands up, she feels like she will lose her stomach" and whenever that happens, her potassium is low. Please call to advise if she can get lab done while here.

## 2021-12-31 ENCOUNTER — Other Ambulatory Visit (INDEPENDENT_AMBULATORY_CARE_PROVIDER_SITE_OTHER): Payer: Medicare Other

## 2021-12-31 DIAGNOSIS — E876 Hypokalemia: Secondary | ICD-10-CM | POA: Diagnosis not present

## 2021-12-31 DIAGNOSIS — R42 Dizziness and giddiness: Secondary | ICD-10-CM | POA: Diagnosis not present

## 2021-12-31 LAB — COMPREHENSIVE METABOLIC PANEL
ALT: 17 U/L (ref 0–35)
AST: 21 U/L (ref 0–37)
Albumin: 3.8 g/dL (ref 3.5–5.2)
Alkaline Phosphatase: 83 U/L (ref 39–117)
BUN: 14 mg/dL (ref 6–23)
CO2: 28 mEq/L (ref 19–32)
Calcium: 9.2 mg/dL (ref 8.4–10.5)
Chloride: 102 mEq/L (ref 96–112)
Creatinine, Ser: 0.82 mg/dL (ref 0.40–1.20)
GFR: 69.18 mL/min (ref >60.00)
Glucose, Bld: 96 mg/dL (ref 70–99)
Potassium: 3.7 mEq/L (ref 3.5–5.1)
Sodium: 139 mEq/L (ref 135–145)
Total Bilirubin: 1 mg/dL (ref 0.2–1.2)
Total Protein: 7.2 g/dL (ref 6.0–8.3)

## 2021-12-31 LAB — CBC WITH DIFFERENTIAL/PLATELET
Basophils Absolute: 0.1 10*3/uL (ref 0.0–0.1)
Basophils Relative: 1.6 % (ref 0.0–3.0)
Eosinophils Absolute: 0.4 10*3/uL (ref 0.0–0.7)
Eosinophils Relative: 5.2 % — ABNORMAL HIGH (ref 0.0–5.0)
HCT: 39.8 % (ref 36.0–46.0)
Hemoglobin: 13.6 g/dL (ref 12.0–15.0)
Lymphocytes Relative: 32.7 % (ref 12.0–46.0)
Lymphs Abs: 2.7 10*3/uL (ref 0.7–4.0)
MCHC: 34.1 g/dL (ref 30.0–36.0)
MCV: 95.6 fl (ref 78.0–100.0)
Monocytes Absolute: 1.2 10*3/uL — ABNORMAL HIGH (ref 0.1–1.0)
Monocytes Relative: 14 % — ABNORMAL HIGH (ref 3.0–12.0)
Neutro Abs: 3.8 10*3/uL (ref 1.4–7.7)
Neutrophils Relative %: 46.5 % (ref 43.0–77.0)
Platelets: 226 10*3/uL (ref 150.0–400.0)
RBC: 4.16 Mil/uL (ref 3.87–5.11)
RDW: 13.8 % (ref 11.5–15.5)
WBC: 8.2 10*3/uL (ref 4.0–10.5)

## 2022-01-02 ENCOUNTER — Ambulatory Visit (INDEPENDENT_AMBULATORY_CARE_PROVIDER_SITE_OTHER): Payer: Medicare Other | Admitting: Family Medicine

## 2022-01-02 ENCOUNTER — Telehealth: Payer: Self-pay | Admitting: Family Medicine

## 2022-01-02 ENCOUNTER — Encounter: Payer: Self-pay | Admitting: Family Medicine

## 2022-01-02 VITALS — BP 130/82 | HR 70 | Temp 97.8°F | Resp 18 | Ht 64.0 in | Wt 141.8 lb

## 2022-01-02 DIAGNOSIS — R42 Dizziness and giddiness: Secondary | ICD-10-CM | POA: Diagnosis not present

## 2022-01-02 DIAGNOSIS — R0602 Shortness of breath: Secondary | ICD-10-CM

## 2022-01-02 DIAGNOSIS — J84112 Idiopathic pulmonary fibrosis: Secondary | ICD-10-CM | POA: Diagnosis not present

## 2022-01-02 DIAGNOSIS — R350 Frequency of micturition: Secondary | ICD-10-CM

## 2022-01-02 DIAGNOSIS — E038 Other specified hypothyroidism: Secondary | ICD-10-CM

## 2022-01-02 DIAGNOSIS — R7989 Other specified abnormal findings of blood chemistry: Secondary | ICD-10-CM | POA: Diagnosis not present

## 2022-01-02 LAB — VITAMIN B12: Vitamin B-12: 1224 pg/mL — ABNORMAL HIGH (ref 211–911)

## 2022-01-02 LAB — POC URINALSYSI DIPSTICK (AUTOMATED)
Bilirubin, UA: NEGATIVE
Blood, UA: NEGATIVE
Glucose, UA: NEGATIVE
Ketones, UA: NEGATIVE
Leukocytes, UA: NEGATIVE
Nitrite, UA: NEGATIVE
Protein, UA: NEGATIVE
Spec Grav, UA: 1.015 (ref 1.010–1.025)
Urobilinogen, UA: 0.2 E.U./dL
pH, UA: 5 (ref 5.0–8.0)

## 2022-01-02 LAB — TSH: TSH: 0.99 u[IU]/mL (ref 0.35–5.50)

## 2022-01-02 NOTE — Addendum Note (Signed)
Addended by: Sanda Linger on: 01/02/2022 02:19 PM   Modules accepted: Orders

## 2022-01-02 NOTE — Progress Notes (Signed)
Established Patient Office Visit  Subjective   Patient ID: Alexandria [name], female    DOB: 12/14/1945  Age: 76 y.o. MRN: [id number]  Chief Complaint  Patient presents with   Post op follow up    Stomach issues  Not feeling well after surgery (deviated septum)     HPI Pt is here c/o feeling full in the head and light headed.  No cp,  no worsening sob Patient Active Problem List   Diagnosis Date Noted   Urinary frequency 01/02/2022   Light headed 01/02/2022   Flat foot 11/19/2021   Pain of right orbit 11/15/2021   S/P total knee arthroplasty, left 11/13/2021   Ankle impingement syndrome, left 11/13/2021   Rectal bleed 08/22/2021   Abnormal thyroid blood test 08/22/2021   Abnormal kidney function 08/22/2021   Bronchitis 07/18/2021   Dumping syndrome 03/13/2020   Hyperglycemia 03/13/2020   Arthritis    Bronchial pneumonia    Colon polyp    DVT (deep venous thrombosis) (HCC)    Heart murmur    Migraines    Pancreatitis    Thyroid disease    Aortic root dilatation (HCC) 11/24/2019   Leukocytosis 11/06/2019   Diarrhea 11/06/2019   Generalized abdominal pain 11/06/2019   Pre-ulcerative corn or callous 09/07/2019   Nausea 09/07/2019   Pain of left calf 05/09/2019   Tibial pain 05/09/2019   Essential hypertension 02/08/2019   Healthcare maintenance 11/10/2018   Bradycardia 07/22/2018   Pansinusitis 06/23/2018   History of transient ischemic attack (TIA) 03/24/2018   IPF (idiopathic pulmonary fibrosis) (HCC) 03/24/2018   Allergic rhinitis 02/16/2018   Eustachian tube dysfunction, bilateral 02/16/2018   URI (upper respiratory infection) 02/16/2018   Seasonal allergic rhinitis due to pollen 02/16/2018   ILD (interstitial lung disease) (HCC) 01/07/2018   Dyspnea 01/07/2018   Acute respiratory failure (HCC) 01/06/2018   TIA (transient ischemic attack) 01/06/2018   Chronic hypokalemia 12/15/2017   Chronic cough 11/26/2017   Basal cell carcinoma (BCC) of neck  06/07/2017   Anisometropia 06/02/2017   Drusen of macula of both eyes 06/02/2017   Photopsia of left eye 06/02/2017   Cardiac murmur 05/07/2017   Diastolic dysfunction 05/07/2017   Situational anxiety 05/07/2017   Headache 05/07/2017   Mitral and aortic insufficiency 05/07/2017   Cellulitis of left lower extremity 04/30/2017   Migraine without status migrainosus, not intractable 06/09/2016   Insomnia 11/18/2015   Normal coronary arteries 05/20/2015   Chest pain at rest 05/20/2015   RBBB 05/20/2015   Abnormal CXR 05/20/2015   Abnormal CT of the chest 05/20/2015   Hx of myocardial infarction 05/08/2015   Bruising 05/08/2015   Upper airway cough syndrome 02/14/2015   Asthmatic bronchitis with acute exacerbation 02/06/2015   Mild intermittent asthma without complication 02/06/2015   Oliguria 12/28/2014   Hypothyroidism 10/25/2014   Fatigue 10/25/2014   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   Chronic venous insufficiency 10/24/2014   Varicose veins of leg with complications 10/24/2014   Degeneration of intervertebral disc of cervical region 06/29/2014   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   Generalized osteoarthritis of multiple sites 06/29/2014   History of stroke without residual deficits 06/29/2014   Palpitations 11/08/2013   SOB (shortness of breath) 10/03/2013   DOE (dyspnea on exertion) 10/03/2013   Edema of both legs 10/03/2013   Edema of lower extremity 10/03/2013   Hair loss 11/25/2012   Alopecia 11/25/2012   Diverticulitis 07/14/2012   Diverticulitis of intestine 07/14/2012   Hammer toe 06/13/2012  Recurrent abdominal pain 04/17/2012   Hypoglycemia 01/06/2012   History of stress test 05/21/2011   Hx of echocardiogram 01/27/2010   H/O cardiac catheterization 2004   Past Medical History:  Diagnosis Date   Arthritis    Asthmatic bronchitis with acute exacerbation 02/06/2015   Bronchial pneumonia    Colon polyp    Diverticulitis    DVT (deep  venous thrombosis) (HCC)    lower extremity   H/O cardiac catheterization 2004   Normal coronary arteries   Heart murmur    History of stress test 05/21/2011   Hx of echocardiogram 01/27/2010   Normal Ef 55% the transmitral spectral doppler flow pattern is normal for age. the left ventricular wall motion is normal   IPF (idiopathic pulmonary fibrosis) ([place]) 01/2018   Migraines    Pancreatitis    Thyroid disease    TIA (transient ischemic attack)    8 years ago   Past Surgical History:  Procedure Laterality Date   BREAST BIOPSY     [facility]   CARDIAC CATHETERIZATION     10/2013   CATARACT EXTRACTION Right 03/22/2018   LEFT HEART CATHETERIZATION WITH CORONARY ANGIOGRAM N/A 10/25/2013   Procedure: LEFT HEART CATHETERIZATION WITH CORONARY ANGIOGRAM;  Surgeon: Troy Sine, MD;  Location: [facility] CATH LAB;  Service: Cardiovascular;  Laterality: N/A;   NECK SURGERY     x's 2   TOTAL KNEE ARTHROPLASTY     Bilateral x's 2   VAGINAL HYSTERECTOMY  10/17/1998   [name]   VIDEO BRONCHOSCOPY Bilateral 01/24/2018   Procedure: VIDEO BRONCHOSCOPY WITH FLUORO;  Surgeon: [name], MD;  Location: [facility];  Service: Cardiopulmonary;  Laterality: Bilateral;   Social History   Tobacco Use   Smoking status: Never   Smokeless tobacco: Never  Vaping Use   Vaping Use: Never used  Substance Use Topics   Alcohol use: No    Alcohol/week: 0.0 standard drinks of alcohol   Drug use: No   Social History   Socioeconomic History   Marital status: Married    Spouse name: [name]   Number of children: 3   Years of education: Jr college   Highest education level: Not on file  Occupational History   Occupation: DISABLED    Employer: UNEMPLOYED  Tobacco Use   Smoking status: Never   Smokeless tobacco: Never  Vaping Use   Vaping Use: Never used  Substance and Sexual Activity   Alcohol use: No    Alcohol/week: 0.0 standard drinks of alcohol   Drug use: No   Sexual activity: Not on file   Other Topics Concern   Not on file  Social History Narrative   Lives with husband [name]   Caffeine use: coffee (2 cups per day)   Mostly right-handed   Social Determinants of Radio broadcast assistant Strain: Not on file  Food Insecurity: Not on file  Transportation Needs: Not on file  Physical Activity: Not on file  Stress: Not on file  Social Connections: Not on file  Intimate Partner Violence: Not on file   Family Status  Relation Name Status   Other  (Not Specified)   Father  Deceased   Brother  (Not Specified)   Brother  (Not Specified)   Brother  (Not Specified)   Brother  (Not Specified)   Brother  (Not Specified)   Mother  Deceased   MGM  (Not Specified)   Family History  Problem Relation Age of Onset   Arthritis Other  Colon cancer Father    Prostate cancer Father    HIV Brother        53   Kidney cancer Brother    Colon cancer Brother    Lung cancer Brother    Other Brother        Mouth Cancer   Ovarian cancer Mother    Uterine cancer Mother    Heart disease Maternal Grandmother    Stroke Maternal Grandmother    Hypertension Maternal Grandmother    Diabetes Maternal Grandmother    Allergies  Allergen Reactions   Prednisone Other (See Comments)    Changed personality    Atrovent Hfa [Ipratropium Bromide Hfa] Other (See Comments)    Pt could not sleep   Cheratussin Ac [Guaifenesin-Codeine]    Codeine Other (See Comments)    Pt takes promethazine with codeine at home   Hydrocodone Itching   Influenza Vaccines Other (See Comments)    Pt reports heart attack after last flu shot   Motrin [Ibuprofen] Other (See Comments)    "Gives false reading in blood"   Oxycodone-Acetaminophen Itching      Review of Systems  Constitutional:  Negative for fever and malaise/fatigue.  HENT:  Negative for congestion.   Eyes:  Negative for blurred vision.  Respiratory:  Negative for shortness of breath.   Cardiovascular:  Negative for chest pain,  palpitations and leg swelling.  Gastrointestinal:  Negative for abdominal pain, blood in stool and nausea.  Genitourinary:  Negative for dysuria and frequency.  Musculoskeletal:  Negative for falls.  Skin:  Negative for rash.  Neurological:  Positive for dizziness. Negative for loss of consciousness and headaches.  Endo/Heme/Allergies:  Negative for environmental allergies.  Psychiatric/Behavioral:  Negative for depression. The patient is not nervous/anxious.       Objective:     BP 130/82 (BP Location: Left Arm, Patient Position: Sitting, Cuff Size: Normal)   Pulse 70   Temp 97.8 F (36.6 C) (Oral)   Resp 18   Ht 5\' 4"  (1.626 m)   Wt 141 lb 12.8 oz (64.3 kg)   SpO2 98%   BMI 24.34 kg/m  BP Readings from Last 3 Encounters:  01/02/22 130/82  11/13/21 (!) 150/98  10/28/21 130/86   Wt Readings from Last 3 Encounters:  01/02/22 141 lb 12.8 oz (64.3 kg)  11/19/21 143 lb (64.9 kg)  11/13/21 143 lb (64.9 kg)   SpO2 Readings from Last 3 Encounters:  01/02/22 98%  10/28/21 98%  08/22/21 98%      Physical Exam Vitals and nursing note reviewed.  Constitutional:      Appearance: She is well-developed.  HENT:     Head: Normocephalic and atraumatic.  Eyes:     Conjunctiva/sclera: Conjunctivae normal.  Neck:     Thyroid: No thyromegaly.     Vascular: No carotid bruit or JVD.  Cardiovascular:     Rate and Rhythm: Normal rate and regular rhythm.     Heart sounds: Normal heart sounds. No murmur heard. Pulmonary:     Effort: Pulmonary effort is normal. No respiratory distress.     Breath sounds: Normal breath sounds. No wheezing or rales.  Chest:     Chest wall: No tenderness.  Musculoskeletal:     Cervical back: Normal range of motion and neck supple.  Neurological:     Mental Status: She is alert and oriented to person, place, and time.  Psychiatric:        Mood and Affect: Mood normal.  Behavior: Behavior normal.        Thought Content: Thought content normal.         Judgment: Judgment normal.      No results found for any visits on 01/02/22.  Last CBC Lab Results  Component Value Date   WBC 8.2 12/31/2021   HGB 13.6 12/31/2021   HCT 39.8 12/31/2021   MCV 95.6 12/31/2021   MCH 31.8 03/21/2021   RDW 13.8 12/31/2021   PLT 226.0 68/11/8108   Last metabolic panel Lab Results  Component Value Date   GLUCOSE 96 12/31/2021   NA 139 12/31/2021   K 3.7 12/31/2021   CL 102 12/31/2021   CO2 28 12/31/2021   BUN 14 12/31/2021   CREATININE 0.82 12/31/2021   EGFR 72 03/21/2021   CALCIUM 9.2 12/31/2021   PROT 7.2 12/31/2021   ALBUMIN 3.8 12/31/2021   BILITOT 1.0 12/31/2021   ALKPHOS 83 12/31/2021   AST 21 12/31/2021   ALT 17 12/31/2021   ANIONGAP 12 11/13/2019   Last lipids Lab Results  Component Value Date   CHOL 196 03/18/2020   HDL 58.70 03/18/2020   LDLCALC 119 (H) 03/18/2020   TRIG 91.0 03/18/2020   CHOLHDL 3 03/18/2020   Last hemoglobin A1c Lab Results  Component Value Date   HGBA1C 5.4 03/18/2020   Last thyroid functions Lab Results  Component Value Date   TSH 1.22 08/22/2021   T4TOTAL 11.0 08/22/2021   Last vitamin D Lab Results  Component Value Date   VD25OH 29.9 (L) 05/09/2019   Last vitamin B12 and Folate Lab Results  Component Value Date   RPRXYVOP92 924 (H) 04/16/2016      The ASCVD Risk score (Arnett DK, et al., 2019) failed to calculate for the following reasons:   The patient has a prior MI or stroke diagnosis    Assessment & Plan:   Problem List Items Addressed This Visit       Unprioritized   Hypothyroidism   Relevant Orders   TSH   Abnormal thyroid blood test   Relevant Orders   TSH   Urinary frequency - Primary    Culture pending      Relevant Orders   POCT Urinalysis Dipstick (Automated)   SOB (shortness of breath)    ekg--- rbbb, sinus  No change from previous  F/u pulmonary      Relevant Orders   DG Chest 2 View   EKG 12-Lead (Completed)   Light headed    Suspect  related to recent ent surgery  F/u if no relief Use mucinex and saline per ent       Relevant Orders   Vitamin B12   DG Chest 2 View   EKG 12-Lead (Completed)   IPF (idiopathic pulmonary fibrosis) ([place])    Per pulm       Return if symptoms worsen or fail to improve.    [name], DO

## 2022-01-02 NOTE — Telephone Encounter (Signed)
Patient states that per her OV today, she does believe she has a sinus infection and would like antibiotics sent to her pharmacy. Please advise.

## 2022-01-02 NOTE — Assessment & Plan Note (Signed)
Per pulm 

## 2022-01-02 NOTE — Assessment & Plan Note (Signed)
Culture pending

## 2022-01-02 NOTE — Assessment & Plan Note (Signed)
ekg--- rbbb, sinus  No change from previous  F/u pulmonary

## 2022-01-02 NOTE — Patient Instructions (Signed)
Dizziness Dizziness is a common problem. It is a feeling of unsteadiness or light-headedness. You may feel like you are about to faint. Dizziness can lead to injury if you stumble or fall. Anyone can become dizzy, but dizziness is more common in older adults. This condition can be caused by a number of things, including medicines, dehydration, or illness. Follow these instructions at home: Eating and drinking  Drink enough fluid to keep your urine pale yellow. This helps to keep you from becoming dehydrated. Try to drink more clear fluids, such as water. Do not drink alcohol. Limit your caffeine intake if told to do so by your health care provider. Check ingredients and nutrition facts to see if a food or beverage contains caffeine. Limit your salt (sodium) intake if told to do so by your health care provider. Check ingredients and nutrition facts to see if a food or beverage contains sodium. Activity  Avoid making quick movements. Rise slowly from chairs and steady yourself until you feel okay. In the morning, first sit up on the side of the bed. When you feel okay, stand slowly while you hold onto something until you know that your balance is good. If you need to stand in one place for a long time, move your legs often. Tighten and relax the muscles in your legs while you are standing. Do not drive or use machinery if you feel dizzy. Avoid bending down if you feel dizzy. Place items in your home so that they are easy for you to reach without leaning over. Lifestyle Do not use any products that contain nicotine or tobacco. These products include cigarettes, chewing tobacco, and vaping devices, such as e-cigarettes. If you need help quitting, ask your health care provider. Try to reduce your stress level by using methods such as yoga or meditation. Talk with your health care provider if you need help to manage your stress. General instructions Watch your dizziness for any changes. Take  over-the-counter and prescription medicines only as told by your health care provider. Talk with your health care provider if you think that your dizziness is caused by a medicine that you are taking. Tell a friend or a family member that you are feeling dizzy. If he or she notices any changes in your behavior, have this person call your health care provider. Keep all follow-up visits. This is important. Contact a health care provider if: Your dizziness does not go away or you have new symptoms. Your dizziness or light-headedness gets worse. You feel nauseous. You have reduced hearing. You have a fever. You have neck pain or a stiff neck. Your dizziness leads to an injury or a fall. Get help right away if: You vomit or have diarrhea and are unable to eat or drink anything. You have problems talking, walking, swallowing, or using your arms, hands, or legs. You feel generally weak. You have any bleeding. You are not thinking clearly or you have trouble forming sentences. It may take a friend or family member to notice this. You have chest pain, abdominal pain, shortness of breath, or sweating. Your vision changes or you develop a severe headache. These symptoms may represent a serious problem that is an emergency. Do not wait to see if the symptoms will go away. Get medical help right away. Call your local emergency services (911 in the [place] Do not drive yourself to the hospital. Summary Dizziness is a feeling of unsteadiness or light-headedness. This condition can be caused by a number of   things, including medicines, dehydration, or illness. Anyone can become dizzy, but dizziness is more common in older adults. Drink enough fluid to keep your urine pale yellow. Do not drink alcohol. Avoid making quick movements if you feel dizzy. Monitor your dizziness for any changes. This information is not intended to replace advice given to you by your health care provider. Make sure you discuss any  questions you have with your health care provider. Document Revised: 02/26/2020 Document Reviewed: 02/26/2020 Elsevier Patient Education  2023 [employer]vier Inc.  

## 2022-01-02 NOTE — Assessment & Plan Note (Signed)
Suspect related to recent ent surgery  F/u if no relief Use mucinex and saline per ent

## 2022-01-04 ENCOUNTER — Other Ambulatory Visit: Payer: Self-pay | Admitting: Family Medicine

## 2022-01-04 DIAGNOSIS — E876 Hypokalemia: Secondary | ICD-10-CM

## 2022-01-05 ENCOUNTER — Other Ambulatory Visit: Payer: Self-pay | Admitting: Family Medicine

## 2022-01-05 DIAGNOSIS — J019 Acute sinusitis, unspecified: Secondary | ICD-10-CM

## 2022-01-05 MED ORDER — CEFDINIR 300 MG PO CAPS: 300.0000 mg | ORAL_CAPSULE | Freq: Two times a day (BID) | ORAL | 0 refills | Status: DC

## 2022-01-26 DIAGNOSIS — I700 Atherosclerosis of aorta: Secondary | ICD-10-CM | POA: Diagnosis not present

## 2022-01-26 DIAGNOSIS — K5900 Constipation, unspecified: Secondary | ICD-10-CM | POA: Diagnosis not present

## 2022-01-26 DIAGNOSIS — R110 Nausea: Secondary | ICD-10-CM | POA: Diagnosis not present

## 2022-01-26 DIAGNOSIS — R109 Unspecified abdominal pain: Secondary | ICD-10-CM | POA: Diagnosis not present

## 2022-01-26 DIAGNOSIS — J8410 Pulmonary fibrosis, unspecified: Secondary | ICD-10-CM | POA: Diagnosis not present

## 2022-01-26 DIAGNOSIS — Z9889 Other specified postprocedural states: Secondary | ICD-10-CM | POA: Diagnosis not present

## 2022-01-26 DIAGNOSIS — R140 Abdominal distension (gaseous): Secondary | ICD-10-CM | POA: Diagnosis not present

## 2022-02-04 ENCOUNTER — Encounter: Payer: Self-pay | Admitting: Medical

## 2022-02-04 ENCOUNTER — Ambulatory Visit (INDEPENDENT_AMBULATORY_CARE_PROVIDER_SITE_OTHER): Payer: Medicare Other | Admitting: Medical

## 2022-02-04 VITALS — BP 158/80 | HR 79 | Temp 98.2°F | Resp 18 | Ht 64.0 in | Wt 141.0 lb

## 2022-02-04 DIAGNOSIS — J019 Acute sinusitis, unspecified: Secondary | ICD-10-CM

## 2022-02-04 DIAGNOSIS — R059 Cough, unspecified: Secondary | ICD-10-CM | POA: Diagnosis not present

## 2022-02-04 DIAGNOSIS — R0981 Nasal congestion: Secondary | ICD-10-CM | POA: Diagnosis not present

## 2022-02-04 DIAGNOSIS — J0100 Acute maxillary sinusitis, unspecified: Secondary | ICD-10-CM | POA: Diagnosis not present

## 2022-02-04 MED ORDER — CEFDINIR 300 MG PO CAPS: 300.0000 mg | ORAL_CAPSULE | Freq: Two times a day (BID) | ORAL | 0 refills | Status: DC

## 2022-02-04 NOTE — Patient Instructions (Addendum)
You appear to have a sinus infection and possible early bronchitis(hx of IPF so watch closely). I am prescribing  cefdnir antibiotic for the infection. To help with the nasal congestion use your AYR gel). For your associated cough, I prescribed cough medicine benzonatate.  Use your symbicort daily.  Bp high today but better on recheck. Possible white coat. If you can check at home when relaxed. Would like to confirm less than 140/90  Rest, hydrate, tylenol for fever.  Follow up in 7 days or as needed.

## 2022-02-04 NOTE — Progress Notes (Signed)
   Subjective:    Patient ID: Alexandria [name], female    DOB: Apr 05, 1946, 77 y.o.   MRN: [id number]  HPI  Pt in for recent nasal congested with sinus pressure for about 10 days. Pt can feel a lot of pnd. She has been taking mucinex every 3 days. She states left ear hurts.  Mild productive cough.  Pt does have symbicort inhaler. She has hx of  IPF. Pt has benzonatate for cough.   Pt states senstive gi system when takes antibitoic. Pt indicates confident she can tolerate cefdnir.   For nasal congestion can continue AYR(saline gel)     Review of Systems  Constitutional:  Negative for chills, fatigue and fever.  HENT:  Positive for congestion, ear pain, sinus pressure and sinus pain.   Respiratory:  Negative for cough, chest tightness, shortness of breath and wheezing.   Cardiovascular:  Negative for chest pain and palpitations.  Gastrointestinal:  Negative for abdominal pain, diarrhea and rectal pain.  Genitourinary:  Negative for dysuria.  Musculoskeletal:  Negative for back pain.  Skin:  Negative for rash.           Objective:   Physical Exam  General- No acute distress. Pleasant patient. Neck- Full range of motion, no jvd Lungs- Clear, even and unlabored. Heart- regular rate and rhythm. Neurologic- CNII- XII grossly intact.   Heent- frontal and maxillary sinus pressure very tender. Canal clear with normal tm.        Assessment & Plan:   Patient Instructions  You appear to have a sinus infection and possible early bronchitis(hx of IPF so watch closely). I am prescribing  cefdnir antibiotic for the infection. To help with the nasal congestion use your AYR gel). For your associated cough, I prescribed cough medicine benzonatate.  Use your symbicort daily.  Bp high today but better on recheck. Possible white coat. If you can check at home when relaxed. Would like to confirm less than 140/90  Rest, hydrate, tylenol for fever.  Follow up in 7 days or as  needed.     Mackie Pai, PA-C

## 2022-02-10 ENCOUNTER — Ambulatory Visit (INDEPENDENT_AMBULATORY_CARE_PROVIDER_SITE_OTHER): Payer: Medicare Other | Admitting: Family Medicine

## 2022-02-10 ENCOUNTER — Encounter: Payer: Self-pay | Admitting: Family Medicine

## 2022-02-10 ENCOUNTER — Ambulatory Visit (HOSPITAL_BASED_OUTPATIENT_CLINIC_OR_DEPARTMENT_OTHER)
Admission: RE | Admit: 2022-02-10 | Discharge: 2022-02-10 | Disposition: A | Discharge status: Home / Self Care | Payer: Medicare Other | Source: Ambulatory Visit | Attending: Family Medicine | Admitting: Family Medicine

## 2022-02-10 VITALS — BP 140/90 | HR 84 | Temp 100.2°F | Resp 20 | Ht 64.0 in | Wt 141.0 lb

## 2022-02-10 DIAGNOSIS — J40 Bronchitis, not specified as acute or chronic: Secondary | ICD-10-CM

## 2022-02-10 DIAGNOSIS — J0100 Acute maxillary sinusitis, unspecified: Secondary | ICD-10-CM | POA: Diagnosis not present

## 2022-02-10 DIAGNOSIS — R051 Acute cough: Secondary | ICD-10-CM

## 2022-02-10 DIAGNOSIS — R059 Cough, unspecified: Secondary | ICD-10-CM | POA: Diagnosis not present

## 2022-02-10 MED ORDER — AMOXICILLIN-POT CLAVULANATE 875-125 MG PO TABS: 1.0000 | ORAL_TABLET | Freq: Two times a day (BID) | ORAL | 0 refills | Status: DC

## 2022-02-10 NOTE — Assessment & Plan Note (Signed)
Abx per orders Pt has cough med  F/u prn

## 2022-02-10 NOTE — Patient Instructions (Signed)

## 2022-02-10 NOTE — Progress Notes (Addendum)
Subjective:   By signing my name below, I, Alexandria [name], attest that this documentation has been prepared under the direction and in the presence of Alexandria Held, DO. 02/10/2022     Patient ID: Alexandria [name], female    DOB: May 14, 1945, 76 y.o.   MRN: [id number]  Chief Complaint  Patient presents with   Cough    Cough Associated symptoms include chills, a fever and headaches. Pertinent negatives include no chest pain, heartburn, myalgias, rash or shortness of breath. There is no history of environmental allergies.   Patient is in today for a office visit.   She complains of cough, headache, hoarse voice, and fever. Her fever started today. She is not coughing up anything. She has seen another provider 6 days prior and was given anti-biotics and nasal spray but found only mild relief.   Past Medical History:  Diagnosis Date   Arthritis    Asthmatic bronchitis with acute exacerbation 02/06/2015   Bronchial pneumonia    Colon polyp    Diverticulitis    DVT (deep venous thrombosis) (HCC)    lower extremity   H/O cardiac catheterization 2004   Normal coronary arteries   Heart murmur    History of stress test 05/21/2011   Hx of echocardiogram 01/27/2010   Normal Ef 55% the transmitral spectral doppler flow pattern is normal for age. the left ventricular wall motion is normal   IPF (idiopathic pulmonary fibrosis) ([place]) 01/2018   Migraines    Pancreatitis    Thyroid disease    TIA (transient ischemic attack)    8 years ago    Past Surgical History:  Procedure Laterality Date   BREAST BIOPSY     [facility]   CARDIAC CATHETERIZATION     10/2013   CATARACT EXTRACTION Right 03/22/2018   LEFT HEART CATHETERIZATION WITH CORONARY ANGIOGRAM N/A 10/25/2013   Procedure: LEFT HEART CATHETERIZATION WITH CORONARY ANGIOGRAM;  Surgeon: Alexandria Sine, MD;  Location: [facility] CATH LAB;  Service: Cardiovascular;  Laterality: N/A;   NECK SURGERY     x's 2   TOTAL KNEE ARTHROPLASTY      Bilateral x's 2   VAGINAL HYSTERECTOMY  10/17/1998   Alexandria [name]   VIDEO BRONCHOSCOPY Bilateral 01/24/2018   Procedure: VIDEO BRONCHOSCOPY WITH FLUORO;  Surgeon: Alexandria Doom, MD;  Location: [place];  Service: Cardiopulmonary;  Laterality: Bilateral;    Family History  Problem Relation Age of Onset   Arthritis Other    Colon cancer Father    Prostate cancer Father    HIV Brother        1982   Kidney cancer Brother    Colon cancer Brother    Lung cancer Brother    Other Brother        Mouth Cancer   Ovarian cancer Mother    Uterine cancer Mother    Heart disease Maternal Grandmother    Stroke Maternal Grandmother    Hypertension Maternal Grandmother    Diabetes Maternal Grandmother     Social History   Socioeconomic History   Marital status: Married    Spouse name: Alexandria [name]   Number of children: 3   Years of education: Jr college   Highest education level: Not on file  Occupational History   Occupation: DISABLED    Employer: UNEMPLOYED  Tobacco Use   Smoking status: Never   Smokeless tobacco: Never  Vaping Use   Vaping Use: Never used  Substance and Sexual Activity   Alcohol  use: No    Alcohol/week: 0.0 standard drinks of alcohol   Drug use: No   Sexual activity: Not on file  Other Topics Concern   Not on file  Social History Narrative   Lives with husband Alexandria [name]   Caffeine use: coffee (2 cups per day)   Mostly right-handed   Social Determinants of Health   Financial Resource Strain: Not on file  Food Insecurity: Not on file  Transportation Needs: Not on file  Physical Activity: Not on file  Stress: Not on file  Social Connections: Not on file  Intimate Partner Violence: Not on file    Outpatient Medications Prior to Visit  Medication Sig Dispense Refill   Cholecalciferol (VITAMIN D3) 125 MCG (5000 UT) CAPS Take 5,000 Units by mouth daily.     clopidogrel (PLAVIX) 75 MG tablet Take 1 tablet (75 mg total) by mouth daily. 90 tablet 3    furosemide (LASIX) 40 MG tablet TAKE 1 TABLET BY MOUTH TWICE A DAY 180 tablet 1   KLOR-CON M20 20 MEQ tablet Take 1 tablet (20 mEq total) by mouth 2 (two) times daily. Will take 3 a daily only as needed. 270 tablet 1   Multiple Vitamin (MULTIVITAMIN) tablet Take 1 tablet by mouth every other day. Gluten free/Vegetarian Multivitamin     Nintedanib (OFEV) 100 MG CAPS Take by mouth.     ondansetron (ZOFRAN-ODT) 4 MG disintegrating tablet TAKE 1 TABLET BY MOUTH EVERY 8 HOURS AS NEEDED FOR NAUSEA AND VOMITING 60 tablet 2   SYNTHROID 112 MCG tablet TAKE 1 TABLET BY MOUTH EVERY DAY 90 tablet 1   vitamin C (ASCORBIC ACID) 500 MG tablet Take 1,000 mg by mouth 2 (two) times daily.      vitamin E 1000 UNIT capsule Take 1,000 Units by mouth daily.     cefdinir (OMNICEF) 300 MG capsule Take 1 capsule (300 mg total) by mouth 2 (two) times daily. 20 capsule 0   No facility-administered medications prior to visit.    Allergies  Allergen Reactions   Prednisone Other (See Comments)    Changed personality    Atrovent Hfa [Ipratropium Bromide Hfa] Other (See Comments)    Pt could not sleep   Cheratussin Ac [Guaifenesin-Codeine]    Codeine Other (See Comments)    Pt takes promethazine with codeine at home   Hydrocodone Itching   Influenza Vaccines Other (See Comments)    Pt reports heart attack after last flu shot   Motrin [Ibuprofen] Other (See Comments)    "Gives false reading in blood"   Oxycodone-Acetaminophen Itching    Review of Systems  Constitutional:  Positive for chills and fever. Negative for malaise/fatigue.  HENT:  Negative for congestion and hearing loss.        (+)hoarse voice  Eyes:  Negative for discharge.  Respiratory:  Positive for cough. Negative for sputum production and shortness of breath.   Cardiovascular:  Negative for chest pain, palpitations and leg swelling.  Gastrointestinal:  Negative for abdominal pain, blood in stool, constipation, diarrhea, heartburn, nausea and  vomiting.  Genitourinary:  Negative for dysuria, frequency, hematuria and urgency.  Musculoskeletal:  Negative for back pain, falls and myalgias.  Skin:  Negative for rash.  Neurological:  Positive for headaches. Negative for dizziness, sensory change, loss of consciousness and weakness.  Endo/Heme/Allergies:  Negative for environmental allergies. Does not bruise/bleed easily.  Psychiatric/Behavioral:  Negative for depression and suicidal ideas. The patient is not nervous/anxious and does not have insomnia.  Objective:    Physical Exam Vitals and nursing note reviewed.  Constitutional:      General: She is not in acute distress.    Appearance: Normal appearance. She is not ill-appearing.  HENT:     Head: Normocephalic and atraumatic.     Right Ear: External ear normal.     Left Ear: External ear normal.  Eyes:     Extraocular Movements: Extraocular movements intact.     Pupils: Pupils are equal, round, and reactive to light.  Cardiovascular:     Rate and Rhythm: Normal rate and regular rhythm.     Heart sounds: Normal heart sounds. No murmur heard.    No gallop.  Pulmonary:     Effort: Pulmonary effort is normal. No respiratory distress.     Breath sounds: Normal breath sounds. No wheezing or rales.  Skin:    General: Skin is warm and dry.  Neurological:     Mental Status: She is alert and oriented to person, place, and time.  Psychiatric:        Judgment: Judgment normal.     BP (!) 140/90 (BP Location: Left Arm, Patient Position: Sitting, Cuff Size: Normal)   Pulse 84   Temp 100.2 F (37.9 C) (Oral)   Resp 20   Ht _0  (1.626 m)   Wt 141 lb (64 kg)   SpO2 97%   BMI 24.20 kg/m  Wt Readings from Last 3 Encounters:  02/10/22 141 lb (64 kg)  02/04/22 141 lb (64 kg)  01/02/22 141 lb 12.8 oz (64.3 kg)    Diabetic Foot Exam - Simple   No data filed    Lab Results  Component Value Date   WBC 8.2 12/31/2021   HGB 13.6 12/31/2021   HCT 39.8 12/31/2021    PLT 226.0 12/31/2021   GLUCOSE 96 12/31/2021   CHOL 196 03/18/2020   TRIG 91.0 03/18/2020   HDL 58.70 03/18/2020   LDLCALC 119 (H) 03/18/2020   ALT 17 12/31/2021   AST 21 12/31/2021   NA 139 12/31/2021   K 3.7 12/31/2021   CL 102 12/31/2021   CREATININE 0.82 12/31/2021   BUN 14 12/31/2021   CO2 28 12/31/2021   TSH 0.99 01/02/2022   INR 1.1 (H) 05/07/2015   HGBA1C 5.4 03/18/2020    Lab Results  Component Value Date   TSH 0.99 01/02/2022   Lab Results  Component Value Date   WBC 8.2 12/31/2021   HGB 13.6 12/31/2021   HCT 39.8 12/31/2021   MCV 95.6 12/31/2021   PLT 226.0 12/31/2021   Lab Results  Component Value Date   NA 139 12/31/2021   K 3.7 12/31/2021   CO2 28 12/31/2021   GLUCOSE 96 12/31/2021   BUN 14 12/31/2021   CREATININE 0.82 12/31/2021   BILITOT 1.0 12/31/2021   ALKPHOS 83 12/31/2021   AST 21 12/31/2021   ALT 17 12/31/2021   PROT 7.2 12/31/2021   ALBUMIN 3.8 12/31/2021   CALCIUM 9.2 12/31/2021   ANIONGAP 12 11/13/2019   EGFR 72 03/21/2021   GFR 69.18 12/31/2021   Lab Results  Component Value Date   CHOL 196 03/18/2020   Lab Results  Component Value Date   HDL 58.70 03/18/2020   Lab Results  Component Value Date   LDLCALC 119 (H) 03/18/2020   Lab Results  Component Value Date   TRIG 91.0 03/18/2020   Lab Results  Component Value Date   CHOLHDL 3 03/18/2020   Lab Results  Component Value Date   HGBA1C 5.4 03/18/2020       Assessment & Plan:   Problem List Items Addressed This Visit       Unprioritized   Subacute maxillary sinusitis - Primary    Abx per orders Pt has cough med  F/u prn       Relevant Medications   amoxicillin-clavulanate (AUGMENTIN) 875-125 MG tablet   Bronchitis   Relevant Medications   amoxicillin-clavulanate (AUGMENTIN) 875-125 MG tablet   Other Visit Diagnoses     Acute cough       Relevant Orders   DG Chest 2 View (Completed)        Meds ordered this encounter  Medications    amoxicillin-clavulanate (AUGMENTIN) 875-125 MG tablet    Sig: Take 1 tablet by mouth 2 (two) times daily.    Dispense:  20 tablet    Refill:  0    I, Alexandria Held, DO, personally preformed the services described in this documentation.  All medical record entries made by the scribe were at my direction and in my presence.  I have reviewed the chart and discharge instructions (if applicable) and agree that the record reflects my personal performance and is accurate and complete. 02/10/2022   I,Alexandria [name],acting as a scribe for Alexandria Held, DO.,have documented all relevant documentation on the behalf of Alexandria Held, DO,as directed by  Alexandria Held, DO while in the presence of Alexandria Held, DO.   Alexandria Held, DO

## 2022-02-16 ENCOUNTER — Other Ambulatory Visit: Payer: Self-pay | Admitting: Family Medicine

## 2022-02-16 ENCOUNTER — Telehealth: Payer: Self-pay | Admitting: Family Medicine

## 2022-02-16 DIAGNOSIS — R051 Acute cough: Secondary | ICD-10-CM

## 2022-02-16 MED ORDER — PROMETHAZINE-DM 6.25-15 MG/5ML PO SYRP: 5.0000 mL | ORAL_SOLUTION | Freq: Four times a day (QID) | ORAL | 0 refills | Status: DC | PRN

## 2022-02-16 MED ORDER — AZITHROMYCIN 250 MG PO TABS: ORAL_TABLET | ORAL | 0 refills | Status: DC

## 2022-02-16 NOTE — Telephone Encounter (Signed)
Patient called to advise that she has saw Dr. Etter Sjogren last week but the cough is not going away and she still feels bad. Patient wants to know if something else can be called in as she is almost finished with cough syrup or if Dr. Etter Sjogren wants to reevaluate her. Please call to advise.

## 2022-02-16 NOTE — Telephone Encounter (Signed)
Patient notified

## 2022-02-20 DIAGNOSIS — J84112 Idiopathic pulmonary fibrosis: Secondary | ICD-10-CM | POA: Diagnosis not present

## 2022-03-01 ENCOUNTER — Other Ambulatory Visit: Payer: Self-pay | Admitting: Family Medicine

## 2022-03-01 DIAGNOSIS — E039 Hypothyroidism, unspecified: Secondary | ICD-10-CM

## 2022-03-04 ENCOUNTER — Ambulatory Visit: Payer: Medicare Other | Admitting: Medical

## 2022-03-07 DIAGNOSIS — N3000 Acute cystitis without hematuria: Secondary | ICD-10-CM | POA: Diagnosis not present

## 2022-03-12 ENCOUNTER — Telehealth: Payer: Self-pay | Admitting: Family Medicine

## 2022-03-12 NOTE — Telephone Encounter (Signed)
Pt states she went to [facility] last Saturday and was diagnosed with a UTI and given medication. They then called her and switched the medication, stating the prior was not going to help. She does not feel like anything has gotten better and is getting concerned. She would like to know what can be done.

## 2022-03-12 NOTE — Telephone Encounter (Signed)
Pt already called back and scheduled.

## 2022-03-12 NOTE — Telephone Encounter (Signed)
Please schedule an appt w/ PCP- urine may need to be re-cultured.

## 2022-03-13 ENCOUNTER — Encounter: Payer: Self-pay | Admitting: Family Medicine

## 2022-03-13 ENCOUNTER — Ambulatory Visit (HOSPITAL_BASED_OUTPATIENT_CLINIC_OR_DEPARTMENT_OTHER)
Admission: RE | Admit: 2022-03-13 | Discharge: 2022-03-13 | Disposition: A | Discharge status: Home / Self Care | Payer: Medicare Other | Source: Ambulatory Visit | Attending: Family Medicine | Admitting: Family Medicine

## 2022-03-13 ENCOUNTER — Other Ambulatory Visit: Payer: Self-pay | Admitting: Family Medicine

## 2022-03-13 ENCOUNTER — Ambulatory Visit (INDEPENDENT_AMBULATORY_CARE_PROVIDER_SITE_OTHER): Payer: Medicare Other | Admitting: Family Medicine

## 2022-03-13 VITALS — BP 144/86 | HR 81 | Temp 98.7°F | Resp 18 | Ht 64.0 in | Wt 139.6 lb

## 2022-03-13 DIAGNOSIS — R109 Unspecified abdominal pain: Secondary | ICD-10-CM | POA: Insufficient documentation

## 2022-03-13 DIAGNOSIS — I700 Atherosclerosis of aorta: Secondary | ICD-10-CM | POA: Diagnosis not present

## 2022-03-13 DIAGNOSIS — R1030 Lower abdominal pain, unspecified: Secondary | ICD-10-CM

## 2022-03-13 DIAGNOSIS — K6289 Other specified diseases of anus and rectum: Secondary | ICD-10-CM

## 2022-03-13 DIAGNOSIS — R8290 Unspecified abnormal findings in urine: Secondary | ICD-10-CM

## 2022-03-13 DIAGNOSIS — R10A0 Flank pain, unspecified side: Secondary | ICD-10-CM

## 2022-03-13 LAB — POC URINALSYSI DIPSTICK (AUTOMATED)
Blood, UA: NEGATIVE
Glucose, UA: NEGATIVE
Nitrite, UA: NEGATIVE
Protein, UA: NEGATIVE
Spec Grav, UA: 1.02 (ref 1.010–1.025)
Urobilinogen, UA: 0.2 E.U./dL
pH, UA: 6 (ref 5.0–8.0)

## 2022-03-13 MED ORDER — CEFTRIAXONE SODIUM 1 G IJ SOLR
1.0000 g | Freq: Once | INTRAMUSCULAR | Status: AC
  Administered 2022-03-13: 1 g via INTRAMUSCULAR

## 2022-03-13 NOTE — Assessment & Plan Note (Signed)
With some blood seen in urine by patient  Check CT  If pain worsens go to ER

## 2022-03-13 NOTE — Assessment & Plan Note (Signed)
Ct done today stat + rectal mass  Refer to GI/ surgery

## 2022-03-13 NOTE — Progress Notes (Signed)
Established Patient Office Visit  Subjective   Patient ID: Alexandria [name], female    DOB: Dec 23, 1945  Age: 76 y.o. MRN: [id number]  Chief Complaint  Patient presents with   Follow-up   Urinary Tract Infection    Pt states UTI has not improved. Pt states she was given Cephalexin first and then Nitro. Pt states unable to pee.    HPI [facility] here [facility].  + dysuria and back pain with abd pain.  She went to [facility] and was dx with uti ---- placed on keflex and then switched to macrobid when culture came back Pain is no better  No other symptoms   Patient Active Problem List   Diagnosis Date Noted   Rectal mass 03/13/2022   Flank pain 03/13/2022   Lower abdominal pain 03/13/2022   Abnormal urine findings 03/13/2022   Urinary frequency 01/02/2022   Light headed 01/02/2022   Flat foot 11/19/2021   Pain of right orbit 11/15/2021   S/P total knee arthroplasty, left 11/13/2021   Ankle impingement syndrome, left 11/13/2021   Rectal bleed 08/22/2021   Abnormal thyroid blood test 08/22/2021   Abnormal kidney function 08/22/2021   Bronchitis 07/18/2021   Dumping syndrome 03/13/2020   Hyperglycemia 03/13/2020   Arthritis    Bronchial pneumonia    Colon polyp    DVT (deep venous thrombosis) (HCC)    Heart murmur    Migraines    Pancreatitis    Thyroid disease    Aortic root dilatation ([place]) 11/24/2019   Leukocytosis 11/06/2019   Diarrhea 11/06/2019   Generalized abdominal pain 11/06/2019   Pre-ulcerative corn or callous 09/07/2019   Nausea 09/07/2019   Pain of left calf 05/09/2019   Tibial pain 05/09/2019   Essential hypertension 02/08/2019   Healthcare maintenance 11/10/2018   Bradycardia 07/22/2018   Pansinusitis 06/23/2018   History of transient ischemic attack (TIA) 03/24/2018   IPF (idiopathic pulmonary fibrosis) ([place]) 03/24/2018   Allergic rhinitis 02/16/2018   Eustachian tube dysfunction, bilateral 02/16/2018   URI (upper respiratory infection) 02/16/2018    Seasonal allergic rhinitis due to pollen 02/16/2018   ILD (interstitial lung disease) ([place]) 01/07/2018   Dyspnea 01/07/2018   Acute respiratory failure ([place]) 01/06/2018   TIA (transient ischemic attack) 01/06/2018   Chronic hypokalemia 12/15/2017   Chronic cough 11/26/2017   Basal cell carcinoma (BCC) of neck 06/07/2017   Anisometropia 06/02/2017   Drusen of macula of both eyes 06/02/2017   Photopsia of left eye 06/02/2017   Cardiac murmur 03/00/9233   Diastolic dysfunction 00/76/2263   Situational anxiety 05/07/2017   Headache 05/07/2017   Mitral and aortic insufficiency 05/07/2017   Cellulitis of left lower extremity 04/30/2017   Migraine without status migrainosus, not intractable 06/09/2016   Insomnia 11/18/2015   Normal coronary arteries 05/20/2015   Chest pain at rest 05/20/2015   RBBB 05/20/2015   Abnormal CXR 05/20/2015   Abnormal CT of the chest 05/20/2015   Hx of myocardial infarction 05/08/2015   Bruising 05/08/2015   Upper airway cough syndrome 02/14/2015   Asthmatic bronchitis with acute exacerbation 02/06/2015   Mild intermittent asthma without complication 33/54/5625   Oliguria 12/28/2014   Hypothyroidism 10/25/2014   Fatigue 10/25/2014   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   Chronic venous insufficiency 10/24/2014   Varicose veins of leg with complications 63/89/3734   Degeneration of intervertebral disc of cervical region 06/29/2014   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   Generalized osteoarthritis of multiple sites 06/29/2014  History of stroke without residual deficits 06/29/2014   Palpitations 11/08/2013   SOB (shortness of breath) 10/03/2013   DOE (dyspnea on exertion) 10/03/2013   Edema of both legs 10/03/2013   Edema of lower extremity 10/03/2013   Subacute maxillary sinusitis 06/17/2013   Hair loss 11/25/2012   Alopecia 11/25/2012   Diverticulitis 07/14/2012   Diverticulitis of intestine 07/14/2012   Hammer toe  06/13/2012   Recurrent abdominal pain 04/17/2012   Hypoglycemia 01/06/2012   History of stress test 05/21/2011   Hx of echocardiogram 01/27/2010   H/O cardiac catheterization 2004   Past Medical History:  Diagnosis Date   Arthritis    Asthmatic bronchitis with acute exacerbation 02/06/2015   Bronchial pneumonia    Colon polyp    Diverticulitis    DVT (deep venous thrombosis) (HCC)    lower extremity   H/O cardiac catheterization 2004   Normal coronary arteries   Heart murmur    History of stress test 05/21/2011   Hx of echocardiogram 01/27/2010   Normal Ef 55% the transmitral spectral doppler flow pattern is normal for age. the left ventricular wall motion is normal   IPF (idiopathic pulmonary fibrosis) ([place]) 01/2018   Migraines    Pancreatitis    Thyroid disease    TIA (transient ischemic attack)    8 years ago   Past Surgical History:  Procedure Laterality Date   BREAST BIOPSY     [facility]   CARDIAC CATHETERIZATION     10/2013   CATARACT EXTRACTION Right 03/22/2018   LEFT HEART CATHETERIZATION WITH CORONARY ANGIOGRAM N/A 10/25/2013   Procedure: LEFT HEART CATHETERIZATION WITH CORONARY ANGIOGRAM;  Surgeon: Troy Sine, MD;  Location: [facility] CATH LAB;  Service: Cardiovascular;  Laterality: N/A;   NECK SURGERY     x's 2   TOTAL KNEE ARTHROPLASTY     Bilateral x's 2   VAGINAL HYSTERECTOMY  10/17/1998   [name]   VIDEO BRONCHOSCOPY Bilateral 01/24/2018   Procedure: VIDEO BRONCHOSCOPY WITH FLUORO;  Surgeon: [name], MD;  Location: [place];  Service: Cardiopulmonary;  Laterality: Bilateral;   Social History   Tobacco Use   Smoking status: Never   Smokeless tobacco: Never  Vaping Use   Vaping Use: Never used  Substance Use Topics   Alcohol use: No    Alcohol/week: 0.0 standard drinks of alcohol   Drug use: No   Social History   Socioeconomic History   Marital status: Married    Spouse name: [name]   Number of children: 3   Years of education: Jr  college   Highest education level: Not on file  Occupational History   Occupation: DISABLED    Employer: UNEMPLOYED  Tobacco Use   Smoking status: Never   Smokeless tobacco: Never  Vaping Use   Vaping Use: Never used  Substance and Sexual Activity   Alcohol use: No    Alcohol/week: 0.0 standard drinks of alcohol   Drug use: No   Sexual activity: Not on file  Other Topics Concern   Not on file  Social History Narrative   Lives with husband [name]   Caffeine use: coffee (2 cups per day)   Mostly right-handed   Social Determinants of Health   Financial Resource Strain: Not on file  Food Insecurity: Not on file  Transportation Needs: Not on file  Physical Activity: Not on file  Stress: Not on file  Social Connections: Not on file  Intimate Partner Violence: Not on file   Family  Status  Relation Name Status   Other  (Not Specified)   Father  Deceased   Brother  (Not Specified)   Brother  (Not Specified)   Brother  (Not Specified)   Brother  (Not Specified)   Brother  (Not Specified)   Mother  Deceased   MGM  (Not Specified)   Family History  Problem Relation Age of Onset   Arthritis Other    Colon cancer Father    Prostate cancer Father    HIV Brother        1982   Kidney cancer Brother    Colon cancer Brother    Lung cancer Brother    Other Brother        Mouth Cancer   Ovarian cancer Mother    Uterine cancer Mother    Heart disease Maternal Grandmother    Stroke Maternal Grandmother    Hypertension Maternal Grandmother    Diabetes Maternal Grandmother    Allergies  Allergen Reactions   Prednisone Other (See Comments)    Changed personality    Atrovent Hfa [Ipratropium Bromide Hfa] Other (See Comments)    Pt could not sleep   Cheratussin Ac [Guaifenesin-Codeine]    Codeine Other (See Comments)    Pt takes promethazine with codeine at home   Hydrocodone Itching   Influenza Vaccines Other (See Comments)    Pt reports heart attack after last flu shot    Motrin [Ibuprofen] Other (See Comments)    "Gives false reading in blood"   Oxycodone-Acetaminophen Itching      ROS    Objective:     BP (!) 144/86 (BP Location: Right Arm, Patient Position: Sitting, Cuff Size: Normal)   Pulse 81   Temp 98.7 F (37.1 C) (Oral)   Resp 18   Ht _0  (1.626 m)   Wt 139 lb 9.6 oz (63.3 kg)   SpO2 97%   BMI 23.96 kg/m  BP Readings from Last 3 Encounters:  03/13/22 (!) 144/86  02/10/22 (!) 140/90  02/04/22 (!) 158/80   Wt Readings from Last 3 Encounters:  03/13/22 139 lb 9.6 oz (63.3 kg)  02/10/22 141 lb (64 kg)  02/04/22 141 lb (64 kg)   SpO2 Readings from Last 3 Encounters:  03/13/22 97%  02/10/22 97%  02/04/22 97%      Physical Exam Vitals and nursing note reviewed.  Constitutional:      Appearance: She is well-developed.  HENT:     Head: Normocephalic and atraumatic.  Eyes:     Conjunctiva/sclera: Conjunctivae normal.  Neck:     Thyroid: No thyromegaly.     Vascular: No carotid bruit or JVD.  Cardiovascular:     Rate and Rhythm: Normal rate and regular rhythm.     Heart sounds: Normal heart sounds. No murmur heard. Pulmonary:     Effort: Pulmonary effort is normal. No respiratory distress.     Breath sounds: Normal breath sounds. No wheezing or rales.  Chest:     Chest wall: No tenderness.  Abdominal:     Tenderness: There is abdominal tenderness in the suprapubic area. There is right CVA tenderness and left CVA tenderness. There is no guarding or rebound.  Musculoskeletal:     Cervical back: Normal range of motion and neck supple.     Right lower leg: No edema.     Left lower leg: No edema.  Neurological:     Mental Status: She is alert and oriented to person, place, and time.  Results for orders placed or performed in visit on 03/13/22  POCT Urinalysis Dipstick (Automated)  Result Value Ref Range   Color, UA dark yellow    Clarity, UA cloudy    Glucose, UA Negative Negative   Bilirubin, UA small     Ketones, UA trace    Spec Grav, UA 1.020 1.010 - 1.025   Blood, UA negative    pH, UA 6.0 5.0 - 8.0   Protein, UA Negative Negative   Urobilinogen, UA 0.2 0.2 or 1.0 E.U./dL   Nitrite, UA negative    Leukocytes, UA Trace (A) Negative    Last CBC Lab Results  Component Value Date   WBC 8.2 12/31/2021   HGB 13.6 12/31/2021   HCT 39.8 12/31/2021   MCV 95.6 12/31/2021   MCH 31.8 03/21/2021   RDW 13.8 12/31/2021   PLT 226.0 04/54/0981   Last metabolic panel Lab Results  Component Value Date   GLUCOSE 96 12/31/2021   NA 139 12/31/2021   K 3.7 12/31/2021   CL 102 12/31/2021   CO2 28 12/31/2021   BUN 14 12/31/2021   CREATININE 0.82 12/31/2021   EGFR 72 03/21/2021   CALCIUM 9.2 12/31/2021   PROT 7.2 12/31/2021   ALBUMIN 3.8 12/31/2021   BILITOT 1.0 12/31/2021   ALKPHOS 83 12/31/2021   AST 21 12/31/2021   ALT 17 12/31/2021   ANIONGAP 12 11/13/2019   Last lipids Lab Results  Component Value Date   CHOL 196 03/18/2020   HDL 58.70 03/18/2020   LDLCALC 119 (H) 03/18/2020   TRIG 91.0 03/18/2020   CHOLHDL 3 03/18/2020   Last hemoglobin A1c Lab Results  Component Value Date   HGBA1C 5.4 03/18/2020   Last thyroid functions Lab Results  Component Value Date   TSH 0.99 01/02/2022   T4TOTAL 11.0 08/22/2021   Last vitamin D Lab Results  Component Value Date   VD25OH 29.9 (L) 05/09/2019   Last vitamin B12 and Folate Lab Results  Component Value Date   VITAMINB12 1,224 (H) 01/02/2022      The ASCVD Risk score (Arnett DK, et al., 2019) failed to calculate for the following reasons:   The patient has a prior MI or stroke diagnosis    Assessment & Plan:   Problem List Items Addressed This Visit       Unprioritized   Rectal mass    Ct done today stat + rectal mass  Refer to GI/ surgery       Relevant Orders   Ambulatory referral to General Surgery   Lower abdominal pain - Primary    With some blood seen in urine by patient  Check CT  If pain worsens  go to ER      Relevant Orders   CT ABDOMEN PELVIS WO CONTRAST (Completed)   POCT Urinalysis Dipstick (Automated) (Completed)   CBC with Differential/Platelet   Comprehensive metabolic panel   Flank pain   Relevant Orders   CT ABDOMEN PELVIS WO CONTRAST (Completed)   POCT Urinalysis Dipstick (Automated) (Completed)   CBC with Differential/Platelet   Comprehensive metabolic panel   Abnormal urine findings   Relevant Orders   Urine Culture    No follow-ups on file.    [name], DO

## 2022-03-14 LAB — CBC WITH DIFFERENTIAL/PLATELET
Absolute Monocytes: 1241 cells/uL — ABNORMAL HIGH (ref 200–950)
Basophils Absolute: 60 cells/uL (ref 0–200)
Basophils Relative: 0.7 %
Eosinophils Absolute: 468 cells/uL (ref 15–500)
Eosinophils Relative: 5.5 %
HCT: 35.1 % (ref 35.0–45.0)
Hemoglobin: 11.9 g/dL (ref 11.7–15.5)
Lymphs Abs: 1003 cells/uL (ref 850–3900)
MCH: 33.3 pg — ABNORMAL HIGH (ref 27.0–33.0)
MCHC: 33.9 g/dL (ref 32.0–36.0)
MCV: 98.3 fL (ref 80.0–100.0)
MPV: 9.8 fL (ref 7.5–12.5)
Monocytes Relative: 14.6 %
Neutro Abs: 5729 cells/uL (ref 1500–7800)
Neutrophils Relative %: 67.4 %
Platelets: 308 10*3/uL (ref 140–400)
RBC: 3.57 10*6/uL — ABNORMAL LOW (ref 3.80–5.10)
RDW: 12.9 % (ref 11.0–15.0)
Total Lymphocyte: 11.8 %
WBC: 8.5 10*3/uL (ref 3.8–10.8)

## 2022-03-14 LAB — URINE CULTURE
MICRO NUMBER:: 14290129
Result:: NO GROWTH
SPECIMEN QUALITY:: ADEQUATE

## 2022-03-14 LAB — COMPREHENSIVE METABOLIC PANEL
AG Ratio: 0.9 (calc) — ABNORMAL LOW (ref 1.0–2.5)
ALT: 14 U/L (ref 6–29)
AST: 16 U/L (ref 10–35)
Albumin: 3.4 g/dL — ABNORMAL LOW (ref 3.6–5.1)
Alkaline phosphatase (APISO): 85 U/L (ref 37–153)
BUN: 8 mg/dL (ref 7–25)
CO2: 26 mmol/L (ref 20–32)
Calcium: 9 mg/dL (ref 8.6–10.4)
Chloride: 100 mmol/L (ref 98–110)
Creat: 0.74 mg/dL (ref 0.60–1.00)
Globulin: 3.7 g/dL (calc) (ref 1.9–3.7)
Glucose, Bld: 84 mg/dL (ref 65–99)
Potassium: 4.1 mmol/L (ref 3.5–5.3)
Sodium: 138 mmol/L (ref 135–146)
Total Bilirubin: 0.4 mg/dL (ref 0.2–1.2)
Total Protein: 7.1 g/dL (ref 6.1–8.1)

## 2022-03-16 ENCOUNTER — Telehealth: Payer: Self-pay | Admitting: Family Medicine

## 2022-03-16 ENCOUNTER — Telehealth: Payer: Self-pay | Admitting: Internal Medicine

## 2022-03-16 NOTE — Telephone Encounter (Signed)
Patient has been scheduled

## 2022-03-16 NOTE — Telephone Encounter (Signed)
Imaging faxed

## 2022-03-16 NOTE — Telephone Encounter (Signed)
Good Morning Dr.Dorsey,  Supervising MD 12/11 AM  We received a referral on this patient to be seen for rectal mass. She has GI hx with [facility] but does not want to continue her care there for personal reasons. She has had a CT done with her pcp which is available for review in Epic.  Records are available for review in Epic via [place]. Please advise on scheduling, thank you.

## 2022-03-16 NOTE — Telephone Encounter (Signed)
[facility] is asking for CT abdomen to be sent to them via fax if possible. [phone].

## 2022-03-17 ENCOUNTER — Telehealth: Payer: Self-pay | Admitting: Family Medicine

## 2022-03-17 ENCOUNTER — Encounter: Payer: Self-pay | Admitting: Internal Medicine

## 2022-03-17 ENCOUNTER — Ambulatory Visit (INDEPENDENT_AMBULATORY_CARE_PROVIDER_SITE_OTHER): Payer: Medicare Other | Admitting: Internal Medicine

## 2022-03-17 DIAGNOSIS — K625 Hemorrhage of anus and rectum: Secondary | ICD-10-CM

## 2022-03-17 DIAGNOSIS — R935 Abnormal findings on diagnostic imaging of other abdominal regions, including retroperitoneum: Secondary | ICD-10-CM

## 2022-03-17 DIAGNOSIS — K6289 Other specified diseases of anus and rectum: Secondary | ICD-10-CM | POA: Diagnosis not present

## 2022-03-17 DIAGNOSIS — K5900 Constipation, unspecified: Secondary | ICD-10-CM

## 2022-03-17 MED ORDER — LINACLOTIDE 145 MCG PO CAPS: 145.0000 ug | ORAL_CAPSULE | Freq: Every day | ORAL | 3 refills | Status: DC

## 2022-03-17 NOTE — Telephone Encounter (Signed)
Pt called stating that her left leg is swollen to twice it's normal size and wanted to let Dr. Etter Sjogren know. Advised pt be transferred to triage to ensure immediate treatment was not needed. Pt refused stating she had an appt soon that she had to get to. Advised she call us back to be triaged after appt. Pt acknowledged understanding and stated she would call back.

## 2022-03-17 NOTE — Progress Notes (Signed)
Chief Complaint: Rectal thickening on CT scan  HPI : 76 year old female with history of migraines, DVT, TIA, hypothyroidism, pulmonary fibrosis, prior pancreatitis, prior diverticulitis presents with abnormal CT scan  She has been having rectal bleeding since 08/2021 and underwent a work up including colonoscopy and CT scans. Her most recent CT scan in 03/2022 showed thickening in the rectum that could be concerning for a possible mass. She feels like her stools are smaller caliber than in the past. She is seeing blood in her stools similar to a medium severity menstrual period. Having a stool is extremely painful. She usually passes 1 stool per day. She feels bloated. Endorses nausea and poor appetite. Denies vomiting. Denies dysphagia. Endorses ab pain in the lower abdomen that is there all the time. Weight has been up-and-down. Aunt had colon cancer. She takes Miralax to help with constipation. Dulcolax did not work at all. She is not Plavix or any other blood thinners. She does take one daily aspirin 81 mg QD. Patient is a singer  Wt Readings from Last 3 Encounters:  03/17/22 144 lb 9.6 oz (65.6 kg)  03/13/22 139 lb 9.6 oz (63.3 kg)  02/10/22 141 lb (64 kg)   Past Medical History:  Diagnosis Date   Arthritis    Asthmatic bronchitis with acute exacerbation 02/06/2015   Bronchial pneumonia    Colon polyp    Diverticulitis    DVT (deep venous thrombosis) (HCC)    lower extremity   H/O cardiac catheterization 2004   Normal coronary arteries   Heart murmur    History of stress test 05/21/2011   Hx of echocardiogram 01/27/2010   Normal Ef 55% the transmitral spectral doppler flow pattern is normal for age. the left ventricular wall motion is normal   IPF (idiopathic pulmonary fibrosis) ([facility]) 01/2018   Migraines    Pancreatitis    Thyroid disease    TIA (transient ischemic attack)    8 years ago     Past Surgical History:  Procedure Laterality Date   BREAST BIOPSY     [facility]    CARDIAC CATHETERIZATION     10/2013   CATARACT EXTRACTION Right 03/22/2018   LEFT HEART CATHETERIZATION WITH CORONARY ANGIOGRAM N/A 10/25/2013   Procedure: LEFT HEART CATHETERIZATION WITH CORONARY ANGIOGRAM;  Surgeon: Troy Sine, MD;  Location: [facility] CATH LAB;  Service: Cardiovascular;  Laterality: N/A;   NECK SURGERY     x's 2   TOTAL KNEE ARTHROPLASTY     Bilateral x's 2   VAGINAL HYSTERECTOMY  10/17/1998   [name]   VIDEO BRONCHOSCOPY Bilateral 01/24/2018   Procedure: VIDEO BRONCHOSCOPY WITH FLUORO;  Surgeon: [name], MD;  Location: [place];  Service: Cardiopulmonary;  Laterality: Bilateral;   Family History  Problem Relation Age of Onset   Ovarian cancer Mother    Uterine cancer Mother    Colon cancer Father    Prostate cancer Father    HIV Brother        69   Kidney cancer Brother    Colon cancer Brother    Lung cancer Brother    Other Brother        Mouth Cancer   Heart disease Maternal Grandmother    Stroke Maternal Grandmother    Hypertension Maternal Grandmother    Diabetes Maternal Grandmother    Arthritis Other    Esophageal cancer Neg Hx    Liver disease Neg Hx    Social History   Tobacco Use  Smoking status: Never   Smokeless tobacco: Never  Vaping Use   Vaping Use: Never used  Substance Use Topics   Alcohol use: No    Alcohol/week: 0.0 standard drinks of alcohol   Drug use: No   Current Outpatient Medications  Medication Sig Dispense Refill   Cholecalciferol (VITAMIN D3) 125 MCG (5000 UT) CAPS Take 5,000 Units by mouth daily.     clopidogrel (PLAVIX) 75 MG tablet Take 1 tablet (75 mg total) by mouth daily. 90 tablet 3   furosemide (LASIX) 40 MG tablet TAKE 1 TABLET BY MOUTH TWICE A DAY 180 tablet 1   KLOR-CON M20 20 MEQ tablet Take 1 tablet (20 mEq total) by mouth 2 (two) times daily. Will take 3 a daily only as needed. 270 tablet 1   Multiple Vitamin (MULTIVITAMIN) tablet Take 1 tablet by mouth every other day. Gluten  free/Vegetarian Multivitamin     Nintedanib (OFEV) 100 MG CAPS Take by mouth.     ondansetron (ZOFRAN-ODT) 4 MG disintegrating tablet TAKE 1 TABLET BY MOUTH EVERY 8 HOURS AS NEEDED FOR NAUSEA AND VOMITING 60 tablet 2   SYNTHROID 112 MCG tablet Take 1 tablet (112 mcg total) by mouth daily before breakfast. 90 tablet 0   vitamin C (ASCORBIC ACID) 500 MG tablet Take 1,000 mg by mouth 2 (two) times daily.      vitamin E 1000 UNIT capsule Take 1,000 Units by mouth daily.     No current facility-administered medications for this visit.   Allergies  Allergen Reactions   Prednisone Other (See Comments)    Changed personality    Atrovent Hfa [Ipratropium Bromide Hfa] Other (See Comments)    Pt could not sleep   Cheratussin Ac [Guaifenesin-Codeine]    Codeine Other (See Comments)    Pt takes promethazine with codeine at home   Hydrocodone Itching   Influenza Vaccines Other (See Comments)    Pt reports heart attack after last flu shot   Motrin [Ibuprofen] Other (See Comments)    "Gives false reading in blood"   Oxycodone-Acetaminophen Itching     Review of Systems: All systems reviewed and negative except where noted in HPI.   Physical Exam: BP 130/88   Pulse 75   Ht '5\' 4"'$  (1.626 m)   Wt 144 lb 9.6 oz (65.6 kg)   SpO2 94%   BMI 24.82 kg/m  Constitutional: Pleasant,well-developed, female in no acute distress. HEENT: Normocephalic and atraumatic. Conjunctivae are normal. No scleral icterus. Cardiovascular: Normal rate, regular rhythm.  Pulmonary/chest: Effort normal and breath sounds normal. No wheezing, rales or rhonchi. Abdominal: Soft, nondistended, mild diffuse tenderness Bowel sounds active throughout. There are no masses palpable. No hepatomegaly. Extremities: No edema Neurological: Alert and oriented to person place and time. Skin: Skin is warm and dry. No rashes noted. Psychiatric: Normal mood and affect. Behavior is normal.  Labs 12/2021: CBC, CMP, Vit B12, and TSH  unremarkable  Labs 03/2022: CBC unremarkable. CMP with mildly low albumin of 3.4  CT A/P w/contrast 11/12/21: IMPRESSION:  1. No acute abdominal/pelvic findings, mass lesions or adenopathy.  No findings suspicious for inflammatory bowel disease/Crohn's  disease.  2. Diffuse colonic diverticulosis and associated muscular wall  thickening in the sigmoid area but no findings for acute  diverticulitis.  3. Moderate age related atherosclerotic calcifications involving the  aorta and branch vessels.  Aortic Atherosclerosis (ICD10-I70.0).   CT A/P w/o contrast 03/13/22: IMPRESSION: 1. There is a mass involving the proximal rectum with diffuse circumferential wall thickening  and luminal narrowing. Surrounding soft tissue infiltration into the perirectal fat is identified. Perirectal adenopathy is identified. 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 Further evaluation with direct visualization and tissue sampling is advised. 2. No signs of bowel obstruction at this time. 3. Sigmoid diverticulosis without signs of acute diverticulitis. 4. No signs of nephrolithiasis or obstructive uropathy. 5. Small bilateral pleural effusions. 6. Signs of chronic interstitial lung disease are again noted. 7.  Aortic Atherosclerosis (ICD10-I70.0).  Colonoscopy 05/26/12:   Colonoscopy 10/31/21: Findings  Moderate left sided diverticulosis was noted.  Redundant distal sigmoid with a segment showing erethyma and friability,  several biopsies done.  Large Internal hemorrhoids were seen on rectal retroflection.  Path: TISSUE LABELED "SIGMOID BIOPSY", BIOPSIES:              FOCALLY MILDLY ACTIVE CHRONIC COLITIS (SEE COMMENT).              NO DYSPLASIA OR MALIGNANCY IDENTIFIED The sigmoid colon biopsies show changes of chronic colitis characterized by a mildly expanded lamina propria containing increased lymphoplasmacytic cells and scattered lymphoid aggregates. Focal mild activity is present.  The glands show mild architectural glandular distortion with decreased mucin production and reactive changes. Focal mild minimal acute cryptitis with rare crypt abscess is present. No granulomas, dysplasia or malignancy are identified. If other etiologies have been excluded, including infection, drug, etc., the changes are compatible with idiopathic inflammatory bowel disease.   ASSESSMENT AND PLAN: Rectal thickening on CT scan Constipation Rectal bleeding Rectal pain Patient presents with rectal bleeding and rectal pain since 08/2021. Also has some lower abdominal pain and constipation. Her recent CT scan on 03/13/22 showed a possible mass in the proximal rectum. Interestingly patient had a colonoscopy in 10/2021 that showed signs of chronic colitis in the sigmoid colon that could be concerning for IBD. Will plan for a flexible sigmoidoscopy in order to better determine what leading to her rectal thickening and surrounding adenopathy. Will start her on some constipation therapies to see if this helps with her symptoms. - Drinks 8 cups of water per day, walk 30 min per day, daily fiber supplement - Start Linzess 145 mcg QD - Flexible sigmoidoscopy [facility] with Ultraslim colonsocope  Christia Reading, MD  I spent 48 minutes of time, including in depth chart review, independent review of results as outlined above, communicating results with the patient directly, face-to-face time with the patient, coordinating care, ordering studies and medications as appropriate, and documentation.

## 2022-03-17 NOTE — Patient Instructions (Addendum)
_______________________________________________________  If you are age 77 or older, your body mass index should be between 23-30. Your Body mass index is 24.82 kg/m. If this is out of the aforementioned range listed, please consider follow up with your Primary Care Provider. ________________________________________________________  The [facility] providers would like to encourage you to use [facility] to communicate with providers for non-urgent requests or questions.  Due to long hold times on the telephone, sending your provider a message by [facility] may be a faster and more efficient way to get a response.  Please allow 48 business hours for a response.  Please remember that this is for non-urgent requests.  _______________________________________________________ We have sent the following medications to your pharmacy for you to pick up at your convenience:  START: Linzess 145 mcg daily You have been scheduled for a flexible sigmoidoscopy. Please follow the written instructions given to you at your visit today. If you use inhalers (even only as needed), please bring them with you on the day of your procedure.  Due to recent changes in healthcare laws, you may see the results of your imaging and laboratory studies on MyChart before your provider has had a chance to review them.  We understand that in some cases there may be results that are confusing or concerning to you. Not all laboratory results come back in the same time frame and the provider may be waiting for multiple results in order to interpret others.  Please give [place] 48 hours in order for your provider to thoroughly review all the results before contacting the office for clarification of your results.   Drink 8 cups of water a day and walk 30 minutes a day.  Please purchase the following medications over the counter and take as directed: Fiber supplement such as Benefiber- use as directed daily  Thank you for entrusting me with your  care and choosing [facility].  Dr [name]

## 2022-03-18 ENCOUNTER — Other Ambulatory Visit: Payer: Self-pay

## 2022-03-18 ENCOUNTER — Telehealth: Payer: Self-pay

## 2022-03-18 NOTE — Telephone Encounter (Signed)
Talked with Dr Libby Maw CMA. Elmyra Ricks knew what the plan was. The patient is advised to follow the written instructions she was given and as was discussed. Patient states understanding of this. No further questions.

## 2022-03-18 NOTE — Telephone Encounter (Signed)
Spoke with patient. Pt states leg has returned to normal size. She has restarted her Lasix and potassium. Pt advised to call back if sxs return.

## 2022-03-18 NOTE — Telephone Encounter (Signed)
Patient calls today about her prep for the flexible sigmoidoscopy scheduled for 03/20/22. She reads me her instructions which she says are hand written. She tells me she is understanding to take magnesium citrate the day before and the day of her procedure. Then she says she is not going to do the Fleets enema because "they never work."  I am unclear on the hand written instructions she says she has. I have tried reviewing the instructions as written in her chart. She is adamant she will not use the enema because "it won't work."  Is this something that was discussed in her visit? Please advise.

## 2022-03-19 ENCOUNTER — Emergency Department (HOSPITAL_BASED_OUTPATIENT_CLINIC_OR_DEPARTMENT_OTHER)
Admission: EM | Admit: 2022-03-19 | Discharge: 2022-03-20 | Disposition: A | Discharge status: Home / Self Care | Payer: Medicare Other | Attending: Emergency Medicine | Admitting: Emergency Medicine

## 2022-03-19 ENCOUNTER — Other Ambulatory Visit: Payer: Self-pay

## 2022-03-19 ENCOUNTER — Encounter (HOSPITAL_BASED_OUTPATIENT_CLINIC_OR_DEPARTMENT_OTHER): Payer: Self-pay

## 2022-03-19 DIAGNOSIS — R42 Dizziness and giddiness: Secondary | ICD-10-CM | POA: Diagnosis not present

## 2022-03-19 DIAGNOSIS — R55 Syncope and collapse: Secondary | ICD-10-CM | POA: Diagnosis not present

## 2022-03-19 DIAGNOSIS — R404 Transient alteration of awareness: Secondary | ICD-10-CM | POA: Diagnosis not present

## 2022-03-19 DIAGNOSIS — R6889 Other general symptoms and signs: Secondary | ICD-10-CM | POA: Diagnosis not present

## 2022-03-19 DIAGNOSIS — R1084 Generalized abdominal pain: Secondary | ICD-10-CM | POA: Diagnosis not present

## 2022-03-19 DIAGNOSIS — I700 Atherosclerosis of aorta: Secondary | ICD-10-CM | POA: Diagnosis not present

## 2022-03-19 DIAGNOSIS — R197 Diarrhea, unspecified: Secondary | ICD-10-CM | POA: Diagnosis not present

## 2022-03-19 DIAGNOSIS — R109 Unspecified abdominal pain: Secondary | ICD-10-CM | POA: Diagnosis not present

## 2022-03-19 DIAGNOSIS — R064 Hyperventilation: Secondary | ICD-10-CM | POA: Diagnosis not present

## 2022-03-19 DIAGNOSIS — Z743 Need for continuous supervision: Secondary | ICD-10-CM | POA: Diagnosis not present

## 2022-03-19 MED ORDER — SODIUM CHLORIDE 0.9 % IV BOLUS
1000.0000 mL | Freq: Once | INTRAVENOUS | Status: AC
  Administered 2022-03-20: 1000 mL via INTRAVENOUS

## 2022-03-19 NOTE — ED Provider Notes (Incomplete)
[facility] EMERGENCY DEPARTMENT Provider Note   CSN: [id number] Arrival date & time: 03/19/22  2314     History {Add pertinent medical, surgical, social history, OB history to HPI:1} Chief Complaint  Patient presents with  . Near Syncope    Alexandria [name] is a 76 y.o. female.  76 yo F with a chief complaint of abdominal discomfort and syncope.  The patient is scheduled to have a colonoscopy in the morning and had done her bowel prep and developed severe crampy abdominal discomfort.  She was attempting to have a bowel movement and she felt lightheaded and passed out.  She is having ongoing colicky abdominal pain on arrival.   Near Syncope       Home Medications Prior to Admission medications   Medication Sig Start Date End Date Taking? Authorizing Provider  Cholecalciferol (VITAMIN D3) 125 MCG (5000 UT) CAPS Take 5,000 Units by mouth daily.    [provider]  clopidogrel (PLAVIX) 75 MG tablet Take 1 tablet (75 mg total) by mouth daily. 11/26/20   Roma Schanz R, DO  furosemide (LASIX) 40 MG tablet TAKE 1 TABLET BY MOUTH TWICE A DAY 09/03/21   Lowne Chase, Yvonne R, DO  KLOR-CON M20 20 MEQ tablet Take 1 tablet (20 mEq total) by mouth 2 (two) times daily. Will take 3 a daily only as needed. 01/06/22   [name], DO  linaclotide (LINZESS) 145 MCG CAPS capsule Take 1 capsule (145 mcg total) by mouth daily before breakfast. 03/17/22   Sharyn Creamer, MD  Multiple Vitamin (MULTIVITAMIN) tablet Take 1 tablet by mouth every other day. Gluten free/Vegetarian Multivitamin    [provider]  Nintedanib (OFEV) 100 MG CAPS Take by mouth. 07/03/20   [provider]  ondansetron (ZOFRAN-ODT) 4 MG disintegrating tablet TAKE 1 TABLET BY MOUTH EVERY 8 HOURS AS NEEDED FOR NAUSEA AND VOMITING 07/18/21   Carollee Herter, Alferd Apa, DO  SYNTHROID 112 MCG tablet Take 1 tablet (112 mcg total) by mouth daily before breakfast. 03/02/22   Carollee Herter,  Alferd Apa, DO  vitamin C (ASCORBIC ACID) 500 MG tablet Take 1,000 mg by mouth 2 (two) times daily.     [provider]  vitamin E 1000 UNIT capsule Take 1,000 Units by mouth daily.    [provider]      Allergies    Prednisone, Atrovent hfa [ipratropium bromide hfa], Cheratussin ac [guaifenesin-codeine], Codeine, Hydrocodone, Influenza vaccines, Motrin [ibuprofen], and Oxycodone-acetaminophen    Review of Systems   Review of Systems  Cardiovascular:  Positive for near-syncope.    Physical Exam Updated Vital Signs BP (!) 178/90 (BP Location: Right Arm)   Pulse 72   Temp 98.2 F (36.8 C) (Oral)   Resp 18   Ht '5\' 4"'$  (1.626 m)   Wt 61.2 kg   SpO2 99%   BMI 23.17 kg/m  Physical Exam Vitals and nursing note reviewed.  Constitutional:      General: She is not in acute distress.    Appearance: She is well-developed. She is not diaphoretic.  HENT:     Head: Normocephalic and atraumatic.  Eyes:     Pupils: Pupils are equal, round, and reactive to light.  Cardiovascular:     Rate and Rhythm: Normal rate and regular rhythm.     Heart sounds: No murmur heard.    No friction rub. No gallop.  Pulmonary:     Effort: Pulmonary effort is normal.  Breath sounds: No wheezing or rales.  Abdominal:     General: There is no distension.     Palpations: Abdomen is soft.     Tenderness: There is abdominal tenderness.     Comments: Diffuse without focality  Musculoskeletal:        General: No tenderness.     Cervical back: Normal range of motion and neck supple.  Skin:    General: Skin is warm and dry.  Neurological:     Mental Status: She is alert and oriented to person, place, and time.  Psychiatric:        Behavior: Behavior normal.     ED Results / Procedures / Treatments   Labs (all labs ordered are listed, but only abnormal results are displayed) Labs Reviewed  CBC WITH DIFFERENTIAL/PLATELET  COMPREHENSIVE METABOLIC PANEL  LIPASE, BLOOD     EKG None  Radiology No results found.  Procedures Procedures  {Document cardiac monitor, telemetry assessment procedure when appropriate:1}  Medications Ordered in ED Medications  sodium chloride 0.9 % bolus 1,000 mL (has no administration in time range)    ED Course/ Medical Decision Making/ A&P                           Medical Decision Making Amount and/or Complexity of Data Reviewed Labs: ordered. ECG/medicine tests: ordered.   76 yo F with a   {Document critical care time when appropriate:1} {Document review of labs and clinical decision tools ie heart score, Chads2Vasc2 etc:1}  {Document your independent review of radiology images, and any outside records:1} {Document your discussion with family members, caretakers, and with consultants:1} {Document social determinants of health affecting pt's care:1} {Document your decision making why or why not admission, treatments were needed:1} Final Clinical Impression(s) / ED Diagnoses Final diagnoses:  None    Rx / DC Orders ED Discharge Orders     None

## 2022-03-19 NOTE — ED Provider Notes (Signed)
[facility] EMERGENCY DEPARTMENT Provider Note   CSN: [id number] Arrival date & time: 03/19/22  2314     History  Chief Complaint  Patient presents with   Near Syncope    Alexandria [name] is a 76 y.o. female.  76 yo F with a chief complaint of abdominal discomfort and syncope.  The patient is scheduled to have a colonoscopy in the morning and had done her bowel prep and developed severe crampy abdominal discomfort.  She was attempting to have a bowel movement and she felt lightheaded and passed out.  She is having ongoing colicky abdominal pain on arrival.   Near Syncope       Home Medications Prior to Admission medications   Medication Sig Start Date End Date Taking? Authorizing Provider  Cholecalciferol (VITAMIN D3) 125 MCG (5000 UT) CAPS Take 5,000 Units by mouth daily.    [provider]  clopidogrel (PLAVIX) 75 MG tablet Take 1 tablet (75 mg total) by mouth daily. 11/26/20   Roma Schanz R, DO  furosemide (LASIX) 40 MG tablet TAKE 1 TABLET BY MOUTH TWICE A DAY 09/03/21   Lowne Chase, Yvonne R, DO  KLOR-CON M20 20 MEQ tablet Take 1 tablet (20 mEq total) by mouth 2 (two) times daily. Will take 3 a daily only as needed. 01/06/22   [name], DO  linaclotide (LINZESS) 145 MCG CAPS capsule Take 1 capsule (145 mcg total) by mouth daily before breakfast. 03/17/22   Sharyn Creamer, MD  Multiple Vitamin (MULTIVITAMIN) tablet Take 1 tablet by mouth every other day. Gluten free/Vegetarian Multivitamin    [provider]  Nintedanib (OFEV) 100 MG CAPS Take by mouth. 07/03/20   [provider]  ondansetron (ZOFRAN-ODT) 4 MG disintegrating tablet TAKE 1 TABLET BY MOUTH EVERY 8 HOURS AS NEEDED FOR NAUSEA AND VOMITING 07/18/21   Carollee Herter, Alferd Apa, DO  SYNTHROID 112 MCG tablet Take 1 tablet (112 mcg total) by mouth daily before breakfast. 03/02/22   Carollee Herter, Alferd Apa, DO  vitamin C (ASCORBIC ACID) 500 MG tablet Take 1,000 mg by  mouth 2 (two) times daily.     [provider]  vitamin E 1000 UNIT capsule Take 1,000 Units by mouth daily.    [provider]      Allergies    Prednisone, Atrovent hfa [ipratropium bromide hfa], Cheratussin ac [guaifenesin-codeine], Codeine, Hydrocodone, Influenza vaccines, Motrin [ibuprofen], and Oxycodone-acetaminophen    Review of Systems   Review of Systems  Cardiovascular:  Positive for near-syncope.    Physical Exam Updated Vital Signs BP (!) 170/69   Pulse 64   Temp 98.2 F (36.8 C) (Oral)   Resp (!) 25   Ht '5\' 4"'$  (1.626 m)   Wt 61.2 kg   SpO2 98%   BMI 23.17 kg/m  Physical Exam Vitals and nursing note reviewed.  Constitutional:      General: She is not in acute distress.    Appearance: She is well-developed. She is not diaphoretic.     Comments: Appears uncomfortable  HENT:     Head: Normocephalic and atraumatic.  Eyes:     Pupils: Pupils are equal, round, and reactive to light.  Cardiovascular:     Rate and Rhythm: Normal rate and regular rhythm.     Heart sounds: No murmur heard.    No friction rub. No gallop.  Pulmonary:     Effort: Pulmonary effort is normal.     Breath sounds: No wheezing  or rales.  Abdominal:     General: There is no distension.     Palpations: Abdomen is soft.     Tenderness: There is abdominal tenderness.     Comments: Diffuse without focality  Musculoskeletal:        General: No tenderness.     Cervical back: Normal range of motion and neck supple.  Skin:    General: Skin is warm and dry.  Neurological:     Mental Status: She is alert and oriented to person, place, and time.  Psychiatric:        Behavior: Behavior normal.     ED Results / Procedures / Treatments   Labs (all labs ordered are listed, but only abnormal results are displayed) Labs Reviewed  CBC WITH DIFFERENTIAL/PLATELET - Abnormal; Notable for the following components:      Result Value   Platelets 429 (*)    All other components  within normal limits  COMPREHENSIVE METABOLIC PANEL - Abnormal; Notable for the following components:   Glucose, Bld 134 (*)    Total Protein 8.7 (*)    Albumin 3.4 (*)    All other components within normal limits  LIPASE, BLOOD    EKG EKG Interpretation  Date/Time:  Thursday March 19 2022 23:57:39 EST Ventricular Rate:  72 PR Interval:  178 QRS Duration: 140 QT Interval:  439 QTC Calculation: 481 R Axis:   -43 Text Interpretation: Sinus rhythm RBBB and LAFB No significant change since last tracing Confirmed by Deno Etienne [phone]) on 03/20/2022 12:36:08 AM  Radiology CT ABDOMEN PELVIS W CONTRAST  Result Date: 03/20/2022 CLINICAL DATA:  Recent syncopal episode while on the toilet with sharp abdominal pain, initial encounter EXAM: CT ABDOMEN AND PELVIS WITH CONTRAST TECHNIQUE: Multidetector CT imaging of the abdomen and pelvis was performed using the standard protocol following bolus administration of intravenous contrast. RADIATION DOSE REDUCTION: This exam was performed according to the departmental dose-optimization program which includes automated exposure control, adjustment of the mA and/or kV according to patient size and/or use of iterative reconstruction technique. 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 Hepatobiliary: Gallbladder is well distended. No evidence of cholelithiasis. The liver is within normal limits. Pancreas: Pancreas is atrophic. Spleen: Normal in size without focal abnormality. Adrenals/Urinary Tract: Adrenal glands are within normal limits. Kidneys demonstrate a normal enhancement pattern bilaterally. No renal calculi are seen. Normal excretion is noted on delayed images. Bladder is within normal limits. Stomach/Bowel: There is again noted an enhancing mass lesion in the rectum near the the sigmoid junction. Diverticular change the colon is  noted. Fluid is noted throughout the colon consistent with the recent colonoscopy prep. The appendix is within normal limits. Small bowel and stomach are unremarkable. Vascular/Lymphatic: Aortic atherosclerosis. Scattered small lymph nodes are noted adjacent to the rectal mass in the pelvis. These measure less than 1 cm in dimension. Reproductive: Status post hysterectomy. No adnexal masses. Other: No free fluid is noted. Musculoskeletal: Degenerative changes of lumbar spine are noted. IMPRESSION: Enhancing mass lesion just below the rectosigmoid junction which corresponds with that seen on prior CT examination. Considerable fluid is noted throughout the colon consistent with the recent colonoscopy prep Diverticulosis without diverticulitis. No other focal abnormality is noted. Electronically Signed   By: MInez CatalinaM.D.   On: 03/20/2022 01:04    Procedures Procedures    Medications Ordered in ED Medications  sodium  chloride 0.9 % bolus 1,000 mL (1,000 mLs Intravenous New Bag/Given 03/20/22 0018)  morphine (PF) 4 MG/ML injection 4 mg (4 mg Intravenous Given 03/20/22 0018)  ondansetron (ZOFRAN) injection 4 mg (4 mg Intravenous Given 03/20/22 0019)  iohexol (OMNIPAQUE) 300 MG/ML solution 100 mL (100 mLs Intravenous Contrast Given 03/20/22 0039)    ED Course/ Medical Decision Making/ A&P                           Medical Decision Making Amount and/or Complexity of Data Reviewed Labs: ordered. Radiology: ordered. ECG/medicine tests: ordered.  Risk Prescription drug management.   76 yo F with a chief complaint of abdominal pain diarrhea and near syncope.  Patient has a scheduled flex Sigg tomorrow.  She did her bowel prep and developed severe abdominal pain with diarrhea.  Had an episode where she lost consciousness while having a bowel movement.  Will treat pain here even though that might leave her prep to not be well for her procedure tomorrow.  CT imaging to assess for complication.  On  my record review the patient was noted to have a rectal mass on most recent CT which is why she is getting the procedure tomorrow.  Patient is feeling quite a bit better on repeat assessment.  No leukocytosis no anemia no significant electrolyte abnormality.  CT scan consistent with having bowel prep and shows her prior rectal mass.  No significant change per radiology read.   1:36 AM:  I have discussed the diagnosis/risks/treatment options with the patient.  Evaluation and diagnostic testing in the emergency department does not suggest an emergent condition requiring admission or immediate intervention beyond what has been performed at this time.  They will follow up with PCP, GI. We also discussed returning to the ED immediately if new or worsening sx occur. We discussed the sx which are most concerning (e.g., sudden worsening pain, fever, inability to tolerate by mouth) that necessitate immediate return. Medications administered to the patient during their visit and any new prescriptions provided to the patient are listed below.  Medications given during this visit Medications  sodium chloride 0.9 % bolus 1,000 mL (1,000 mLs Intravenous New Bag/Given 03/20/22 0018)  morphine (PF) 4 MG/ML injection 4 mg (4 mg Intravenous Given 03/20/22 0018)  ondansetron (ZOFRAN) injection 4 mg (4 mg Intravenous Given 03/20/22 0019)  iohexol (OMNIPAQUE) 300 MG/ML solution 100 mL (100 mLs Intravenous Contrast Given 03/20/22 0039)     The patient appears reasonably screen and/or stabilized for discharge and I doubt any other medical condition or other [facility] requiring further screening, evaluation, or treatment in the ED at this time prior to discharge.          Final Clinical Impression(s) / ED Diagnoses Final diagnoses:  Syncope and collapse  Generalized abdominal pain    Rx / DC Orders ED Discharge Orders     None         Deno Etienne, DO 03/20/22 0136

## 2022-03-19 NOTE — ED Triage Notes (Signed)
Pt is taking bowel prep for a colonoscopy in the am at 7 at [facility]. Went to the bathroom and passed out on the toilet. Now having sharp abdominal pain.

## 2022-03-20 ENCOUNTER — Telehealth: Payer: Self-pay | Admitting: Internal Medicine

## 2022-03-20 ENCOUNTER — Encounter: Payer: Self-pay | Admitting: Internal Medicine

## 2022-03-20 ENCOUNTER — Emergency Department (HOSPITAL_BASED_OUTPATIENT_CLINIC_OR_DEPARTMENT_OTHER): Payer: Medicare Other

## 2022-03-20 ENCOUNTER — Ambulatory Visit (AMBULATORY_SURGERY_CENTER): Payer: Medicare Other | Admitting: Internal Medicine

## 2022-03-20 VITALS — BP 143/69 | HR 60 | Temp 96.0°F | Resp 19 | Ht 64.0 in | Wt 144.0 lb

## 2022-03-20 DIAGNOSIS — R109 Unspecified abdominal pain: Secondary | ICD-10-CM | POA: Diagnosis not present

## 2022-03-20 DIAGNOSIS — K621 Rectal polyp: Secondary | ICD-10-CM | POA: Diagnosis not present

## 2022-03-20 DIAGNOSIS — K639 Disease of intestine, unspecified: Secondary | ICD-10-CM | POA: Diagnosis not present

## 2022-03-20 DIAGNOSIS — K5150 Left sided colitis without complications: Secondary | ICD-10-CM | POA: Diagnosis not present

## 2022-03-20 DIAGNOSIS — K602 Anal fissure, unspecified: Secondary | ICD-10-CM

## 2022-03-20 DIAGNOSIS — K5289 Other specified noninfective gastroenteritis and colitis: Secondary | ICD-10-CM

## 2022-03-20 DIAGNOSIS — K625 Hemorrhage of anus and rectum: Secondary | ICD-10-CM

## 2022-03-20 DIAGNOSIS — R55 Syncope and collapse: Secondary | ICD-10-CM | POA: Diagnosis not present

## 2022-03-20 DIAGNOSIS — K6289 Other specified diseases of anus and rectum: Secondary | ICD-10-CM

## 2022-03-20 DIAGNOSIS — R933 Abnormal findings on diagnostic imaging of other parts of digestive tract: Secondary | ICD-10-CM | POA: Diagnosis not present

## 2022-03-20 DIAGNOSIS — R935 Abnormal findings on diagnostic imaging of other abdominal regions, including retroperitoneum: Secondary | ICD-10-CM

## 2022-03-20 DIAGNOSIS — I700 Atherosclerosis of aorta: Secondary | ICD-10-CM | POA: Diagnosis not present

## 2022-03-20 LAB — CBC WITH DIFFERENTIAL/PLATELET
Abs Immature Granulocytes: 0.06 10*3/uL (ref 0.00–0.07)
Basophils Absolute: 0.1 10*3/uL (ref 0.0–0.1)
Basophils Relative: 1 %
Eosinophils Absolute: 0.2 10*3/uL (ref 0.0–0.5)
Eosinophils Relative: 3 %
HCT: 39.7 % (ref 36.0–46.0)
Hemoglobin: 13.1 g/dL (ref 12.0–15.0)
Immature Granulocytes: 1 %
Lymphocytes Relative: 17 %
Lymphs Abs: 1.4 10*3/uL (ref 0.7–4.0)
MCH: 32.8 pg (ref 26.0–34.0)
MCHC: 33 g/dL (ref 30.0–36.0)
MCV: 99.3 fL (ref 80.0–100.0)
Monocytes Absolute: 0.7 10*3/uL (ref 0.1–1.0)
Monocytes Relative: 9 %
Neutro Abs: 5.7 10*3/uL (ref 1.7–7.7)
Neutrophils Relative %: 69 %
Platelets: 429 10*3/uL — ABNORMAL HIGH (ref 150–400)
RBC: 4 MIL/uL (ref 3.87–5.11)
RDW: 14.2 % (ref 11.5–15.5)
WBC: 8.2 10*3/uL (ref 4.0–10.5)
nRBC: 0 % (ref 0.0–0.2)

## 2022-03-20 LAB — LIPASE, BLOOD: Lipase: 23 U/L (ref 11–51)

## 2022-03-20 LAB — COMPREHENSIVE METABOLIC PANEL
ALT: 17 U/L (ref 0–44)
AST: 34 U/L (ref 15–41)
Albumin: 3.4 g/dL — ABNORMAL LOW (ref 3.5–5.0)
Alkaline Phosphatase: 83 U/L (ref 38–126)
Anion gap: 11 (ref 5–15)
BUN: 14 mg/dL (ref 8–23)
CO2: 25 mmol/L (ref 22–32)
Calcium: 9.3 mg/dL (ref 8.9–10.3)
Chloride: 99 mmol/L (ref 98–111)
Creatinine, Ser: 0.97 mg/dL (ref 0.44–1.00)
GFR, Estimated: >60 mL/min (ref >60)
Glucose, Bld: 134 mg/dL — ABNORMAL HIGH (ref 70–99)
Potassium: 4.2 mmol/L (ref 3.5–5.1)
Sodium: 135 mmol/L (ref 135–145)
Total Bilirubin: 0.8 mg/dL (ref 0.3–1.2)
Total Protein: 8.7 g/dL — ABNORMAL HIGH (ref 6.5–8.1)

## 2022-03-20 MED ORDER — AMBULATORY NON FORMULARY MEDICATION: 0 refills | Status: DC

## 2022-03-20 MED ORDER — SODIUM CHLORIDE 0.9 % IV SOLN: 500.0000 mL | Freq: Once | INTRAVENOUS | Status: DC

## 2022-03-20 MED ORDER — ONDANSETRON HCL 4 MG/2ML IJ SOLN
4.0000 mg | Freq: Once | INTRAMUSCULAR | Status: AC
  Administered 2022-03-20: 4 mg via INTRAVENOUS
  Filled 2022-03-20: qty 2

## 2022-03-20 MED ORDER — IOHEXOL 300 MG/ML  SOLN
100.0000 mL | Freq: Once | INTRAMUSCULAR | Status: AC | PRN
  Administered 2022-03-20: 100 mL via INTRAVENOUS

## 2022-03-20 MED ORDER — MORPHINE SULFATE (PF) 4 MG/ML IV SOLN
4.0000 mg | Freq: Once | INTRAVENOUS | Status: AC
  Administered 2022-03-20: 4 mg via INTRAVENOUS
  Filled 2022-03-20: qty 1

## 2022-03-20 NOTE — Progress Notes (Signed)
Called to room to assist during endoscopic procedure.  Patient ID and intended procedure confirmed with present staff. Received instructions for my participation in the procedure from the performing physician.  

## 2022-03-20 NOTE — Progress Notes (Signed)
Spoke with Dr. [name] about pt's allergies- Diltiazem is listed on her list with an effect of nausea, shakes.  Per Dr. [name] ok to give RX.

## 2022-03-20 NOTE — Op Note (Signed)
[facility] Patient Name: Alexandria [name] Procedure Date: 03/20/2022 7:37 AM MRN: [id number] Endoscopist: Georgian Co , , [id number] Age: 76 Referring MD:  Date of Birth: Apr 30, 1945 Gender: Female Account #: [id number] Procedure:                Flexible Sigmoidoscopy Indications:              Rectal hemorrhage, Abnormal CT of the GI tract,                            Rectal pain Medicines:                Monitored Anesthesia Care Procedure:                Pre-Anesthesia Assessment:                           - Prior to the procedure, a History and Physical                            was performed, and patient medications and                            allergies were reviewed. The patient's tolerance of                            previous anesthesia was also reviewed. The risks                            and benefits of the procedure and the sedation                            options and risks were discussed with the patient.                            All questions were answered, and informed consent                            was obtained. Prior Anticoagulants: The patient has                            taken no anticoagulant or antiplatelet agents. ASA                            Grade Assessment: III - A patient with severe                            systemic disease. After reviewing the risks and                            benefits, the patient was deemed in satisfactory                            condition to undergo the procedure.  After obtaining informed consent, the scope was                            passed under direct vision. The [id number] [id number]-[id number]                            Slim SB Colonoscope was introduced through the anus                            and advanced to the the left transverse colon. The                            flexible sigmoidoscopy was accomplished without                            difficulty. The patient  tolerated the procedure                            well. Scope In: 8:48:50 AM Scope Out: 9:10:48 AM Total Procedure Duration: 0 hours 21 minutes 58 seconds  Findings:                 An area of congested mucosa was found in the                            sigmoid colon, in the descending colon and at the                            splenic flexure. This was biopsied with a cold                            forceps for histology.                           Multiple diverticula were found in the sigmoid                            colon and descending colon.                           A polypoid partially obstructing large mass was                            found in the recto-sigmoid colon. The mass measured                            one cm in length. In addition, its diameter                            measured fourteen mm. No bleeding was present.                            Biopsies were taken with a cold forceps for  histology.                           Non-bleeding internal hemorrhoids were found during                            retroflexion.                           An anal fissure was found on perianal exam. Complications:            No immediate complications. Estimated Blood Loss:     Estimated blood loss was minimal. Impression:               - Congested mucosa in the sigmoid colon, in the                            descending colon and at the splenic flexure.                            Biopsied.                           - Diverticulosis in the sigmoid colon and in the                            descending colon.                           - Rule out malignancy, partially obstructing tumor                            in the recto-sigmoid colon. Biopsied.                           - Non-bleeding internal hemorrhoids.                           - Anal fissure found on perianal exam. Recommendation:           - Discharge patient to home (with escort).                            - Await pathology results.                           - Start diltiazem 2% with lidocaine 5%. Using your                            index finger, apply a small amount of medication                            inside the rectum up to your first knuckle/joint                            TID for 8 weeks.                           -  The findings and recommendations were discussed                            with the patient. Dr Georgian Co "Lyndee Leo" [name],  03/20/2022 9:19:20 AM

## 2022-03-20 NOTE — Telephone Encounter (Signed)
Patient was prescribed an ointment after colonoscopy and her pharmacy says it hasn't been called in. She needs an update. Please advise.

## 2022-03-20 NOTE — Progress Notes (Signed)
GASTROENTEROLOGY PROCEDURE H&P NOTE   Primary Care Physician: [name], DO    Reason for Procedure:   Abnormal CT of the abdomen, rectal bleeding, rectal pain  Plan:    Flexible sigmoidoscopy  Patient is appropriate for endoscopic procedure(s) in the ambulatory ([facility]) setting.  The nature of the procedure, as well as the risks, benefits, and alternatives were carefully and thoroughly reviewed with the patient. Ample time for discussion and questions allowed. The patient understood, was satisfied, and agreed to proceed.     HPI: Alexandria [name] is a 76 y.o. female who presents for flexible sigmoidoscopy for evaluation of abnormal CT of the abdomen, rectal bleeding, and rectal pain.  Patient was most recently seen in the Gastroenterology Clinic on 03/17/22.  No interval change in medical history since that appointment. Please refer to that note for full details regarding GI history and clinical presentation.   Past Medical History:  Diagnosis Date   Arthritis    Asthmatic bronchitis with acute exacerbation 02/06/2015   Bronchial pneumonia    Colon polyp    Diverticulitis    DVT (deep venous thrombosis) (HCC)    lower extremity   H/O cardiac catheterization 2004   Normal coronary arteries   Heart murmur    History of stress test 05/21/2011   Hx of echocardiogram 01/27/2010   Normal Ef 55% the transmitral spectral doppler flow pattern is normal for age. the left ventricular wall motion is normal   IPF (idiopathic pulmonary fibrosis) ([facility]) 01/2018   Migraines    Pancreatitis    Thyroid disease    TIA (transient ischemic attack)    8 years ago    Past Surgical History:  Procedure Laterality Date   BREAST BIOPSY     [facility]   CARDIAC CATHETERIZATION     10/2013   CATARACT EXTRACTION Right 03/22/2018   LEFT HEART CATHETERIZATION WITH CORONARY ANGIOGRAM N/A 10/25/2013   Procedure: LEFT HEART CATHETERIZATION WITH CORONARY ANGIOGRAM;  Surgeon: Troy Sine, MD;  Location: [facility] CATH LAB;  Service: Cardiovascular;  Laterality: N/A;   NECK SURGERY     x's 2   TOTAL KNEE ARTHROPLASTY     Bilateral x's 2   VAGINAL HYSTERECTOMY  10/17/1998   [name]   VIDEO BRONCHOSCOPY Bilateral 01/24/2018   Procedure: VIDEO BRONCHOSCOPY WITH FLUORO;  Surgeon: [name], MD;  Location: [facility];  Service: Cardiopulmonary;  Laterality: Bilateral;    Prior to Admission medications   Medication Sig Start Date End Date Taking? Authorizing Provider  benzonatate (TESSALON) 100 MG capsule Take 200 mg by mouth 3 (three) times daily as needed. 03/06/22  Yes [provider]  furosemide (LASIX) 40 MG tablet TAKE 1 TABLET BY MOUTH TWICE A DAY 09/03/21  Yes Lowne Chase, Yvonne R, DO  KLOR-CON M20 20 MEQ tablet Take 1 tablet (20 mEq total) by mouth 2 (two) times daily. Will take 3 a daily only as needed. 01/06/22  Yes [name], DO  Cholecalciferol (VITAMIN D3) 125 MCG (5000 UT) CAPS Take 5,000 Units by mouth daily.    [provider]  linaclotide Rolan Lipa) 145 MCG CAPS capsule Take 1 capsule (145 mcg total) by mouth daily before breakfast. 03/17/22   Sharyn Creamer, MD  Multiple Vitamin (MULTIVITAMIN) tablet Take 1 tablet by mouth every other day. Gluten free/Vegetarian Multivitamin    [provider]  Nintedanib (OFEV) 100 MG CAPS Take by mouth. 07/03/20   [provider]  ondansetron (  ZOFRAN-ODT) 4 MG disintegrating tablet TAKE 1 TABLET BY MOUTH EVERY 8 HOURS AS NEEDED FOR NAUSEA AND VOMITING 07/18/21   Carollee Herter, Alferd Apa, DO  SYNTHROID 112 MCG tablet Take 1 tablet (112 mcg total) by mouth daily before breakfast. 03/02/22   Carollee Herter, Alferd Apa, DO  vitamin C (ASCORBIC ACID) 500 MG tablet Take 1,000 mg by mouth 2 (two) times daily.     [provider]  vitamin E 1000 UNIT capsule Take 1,000 Units by mouth daily.    [provider]    Current Outpatient Medications  Medication Sig Dispense  Refill   benzonatate (TESSALON) 100 MG capsule Take 200 mg by mouth 3 (three) times daily as needed.     furosemide (LASIX) 40 MG tablet TAKE 1 TABLET BY MOUTH TWICE A DAY 180 tablet 1   KLOR-CON M20 20 MEQ tablet Take 1 tablet (20 mEq total) by mouth 2 (two) times daily. Will take 3 a daily only as needed. 270 tablet 1   Cholecalciferol (VITAMIN D3) 125 MCG (5000 UT) CAPS Take 5,000 Units by mouth daily.     linaclotide (LINZESS) 145 MCG CAPS capsule Take 1 capsule (145 mcg total) by mouth daily before breakfast. 30 capsule 3   Multiple Vitamin (MULTIVITAMIN) tablet Take 1 tablet by mouth every other day. Gluten free/Vegetarian Multivitamin     Nintedanib (OFEV) 100 MG CAPS Take by mouth.     ondansetron (ZOFRAN-ODT) 4 MG disintegrating tablet TAKE 1 TABLET BY MOUTH EVERY 8 HOURS AS NEEDED FOR NAUSEA AND VOMITING 60 tablet 2   SYNTHROID 112 MCG tablet Take 1 tablet (112 mcg total) by mouth daily before breakfast. 90 tablet 0   vitamin C (ASCORBIC ACID) 500 MG tablet Take 1,000 mg by mouth 2 (two) times daily.      vitamin E 1000 UNIT capsule Take 1,000 Units by mouth daily.     Current Facility-Administered Medications  Medication Dose Route Frequency Provider Last Rate Last Admin   0.9 %  sodium chloride infusion  500 mL Intravenous Once Sharyn Creamer, MD        Allergies as of 03/20/2022 - Review Complete 03/20/2022  Allergen Reaction Noted   Prednisone Other (See Comments) 12/18/2011   Atrovent hfa [ipratropium bromide hfa] Other (See Comments) 02/14/2015   Cheratussin ac [guaifenesin-codeine]  01/29/2015   Codeine Other (See Comments) 05/25/2015   Diltiazem  09/09/2021   Hydrocodone Itching 09/28/2012   Influenza vaccines Other (See Comments) 01/30/2015   Motrin [ibuprofen] Other (See Comments) 10/23/2013   Oxycodone-acetaminophen Itching 09/23/2004    Family History  Problem Relation Age of Onset   Ovarian cancer Mother    Uterine cancer Mother    Lung cancer Father    HIV  Brother        1982   Kidney cancer Brother    Lung cancer Brother    Other Brother        Mouth Cancer   Colon cancer Maternal Aunt    Heart disease Maternal Grandmother    Stroke Maternal Grandmother    Hypertension Maternal Grandmother    Diabetes Maternal Grandmother    Arthritis Other    Esophageal cancer Neg Hx    Liver disease Neg Hx    Rectal cancer Neg Hx    Stomach cancer Neg Hx     Social History   Socioeconomic History   Marital status: Married    Spouse name: [name]   Number of children: 3   Years  of education: [employer] education level: Not on file  Occupational History   Occupation: DISABLED    Employer: UNEMPLOYED   Occupation: retired  Tobacco Use   Smoking status: Never   Smokeless tobacco: Never  Scientific laboratory technician Use: Never used  Substance and Sexual Activity   Alcohol use: No    Alcohol/week: 0.0 standard drinks of alcohol   Drug use: No   Sexual activity: Not on file  Other Topics Concern   Not on file  Social History Narrative   Lives with husband [name]   Caffeine use: coffee (2 cups per day)   Mostly right-handed   Social Determinants of Radio broadcast assistant Strain: Not on file  Food Insecurity: Not on file  Transportation Needs: Not on file  Physical Activity: Not on file  Stress: Not on file  Social Connections: Not on file  Intimate Partner Violence: Not on file    Physical Exam: Vital signs in last 24 hours: BP 136/82   Pulse 74   Temp (!) 96 F (35.6 C) (Temporal)   Ht '5\' 4"'$  (1.626 m)   Wt 144 lb (65.3 kg)   SpO2 96%   BMI 24.72 kg/m  GEN: NAD EYE: Sclerae anicteric ENT: MMM CV: Non-tachycardic Pulm: No increased WOB GI: Soft NEURO:  Alert & Oriented   Christia Reading, MD [facility]   03/20/2022 7:42 AM

## 2022-03-20 NOTE — Patient Instructions (Addendum)
- Discharge patient to home (with escort).                           - Await pathology results.                           - Start diltiazem 2% with lidocaine 5%. Using your                            index finger, apply a small amount of medication                            inside the rectum up to your first knuckle/joint                            three times daily for 8 weeks.                           - The findings and recommendations were discussed                            with the patient.   [facility]'s information is below:  Address: [address], [place], [place] [address]  [phone]      YOU HAD AN ENDOSCOPIC PROCEDURE TODAY AT [facility]:   Refer to the procedure report that was given to you for any specific questions about what was found during the examination.  If the procedure report does not answer your questions, please call your gastroenterologist to clarify.  If you requested that your care partner not be given the details of your procedure findings, then the procedure report has been included in a sealed envelope for you to review at your convenience later.  YOU SHOULD EXPECT: Some feelings of bloating in the abdomen. Passage of more gas than usual.  Walking can help get rid of the air that was put into your GI tract during the procedure and reduce the bloating. If you had a lower endoscopy (such as a colonoscopy or flexible sigmoidoscopy) you may notice spotting of blood in your stool or on the toilet paper. If you underwent a bowel prep for your procedure, you may not have a normal bowel movement for a few days.  Please Note:  You might notice some irritation and congestion in your nose or some drainage.  This is from the oxygen used during your procedure.  There is no need for concern and it should clear up in a day or so.  SYMPTOMS TO REPORT IMMEDIATELY:  Following lower endoscopy (colonoscopy or flexible  sigmoidoscopy):  Excessive amounts of blood in the stool  Significant tenderness or worsening of abdominal pains  Swelling of the abdomen that is new, acute  Fever of 100F or higher  For urgent or emergent issues, a gastroenterologist can be reached at any hour by calling [phone]. Do not use MyChart messaging for urgent concerns.    DIET:  We do recommend a small meal at first, but then you may proceed to your regular diet.  Drink plenty of fluids but you should avoid alcoholic beverages for 24 hours.  ACTIVITY:  You should plan to take it easy for the  rest of today and you should NOT DRIVE or use heavy machinery until tomorrow (because of the sedation medicines used during the test).    FOLLOW UP: Our staff will call the number listed on your records the next business day following your procedure.  We will call around 7:15- 8:00 am to check on you and address any questions or concerns that you may have regarding the information given to you following your procedure. If we do not reach you, we will leave a message.     If any biopsies were taken you will be contacted by phone or by letter within the next 1-3 weeks.  Please call us at [phone] if you have not heard about the biopsies in 3 weeks.    SIGNATURES/CONFIDENTIALITY: You and/or your care partner have signed paperwork which will be entered into your electronic medical record.  These signatures attest to the fact that that the information above on your After Visit Summary has been reviewed and is understood.  Full responsibility of the confidentiality of this discharge information lies with you and/or your care-partner.

## 2022-03-20 NOTE — Telephone Encounter (Signed)
Spoke with patient who stated that she went to [facility] with her discharge paper work and there was no Rx for diltiazem gel.  Rx for Diltiazem gel reprinted and faxed to [phone].  Patient aware and verbalized understanding. No further questions.

## 2022-03-20 NOTE — Progress Notes (Signed)
PT taken to PACU. Monitors in place. VSS. Report given to RN. 

## 2022-03-20 NOTE — ED Notes (Signed)
Patient transported to CT 

## 2022-03-20 NOTE — Discharge Instructions (Signed)
Go to your appointment this morning.  See if they are able to do your procedure if you are unable to complete your prep.  Please return to the emergency department for worsening pain fever or inability to eat or drink.

## 2022-03-20 NOTE — Progress Notes (Signed)
Pt's states no medical or surgical changes since previsit or office visit. 

## 2022-03-23 ENCOUNTER — Encounter: Payer: Self-pay | Admitting: Internal Medicine

## 2022-03-23 ENCOUNTER — Telehealth: Payer: Self-pay | Admitting: *Deleted

## 2022-03-23 NOTE — Telephone Encounter (Signed)
  Follow up Call-     03/20/2022    7:27 AM  Call back number  Post procedure Call Back phone  # [phone]  Permission to leave phone message Yes     Patient questions:  Do you have a fever, pain , or abdominal swelling? No. Pain Score  0 *  Have you tolerated food without any problems? Yes.    Have you been able to return to your normal activities? Yes.    Do you have any questions about your discharge instructions: Diet   No. Medications  No. Follow up visit  No.  Do you have questions or concerns about your Care? No.  Actions: * If pain score is 4 or above: No action needed, pain <4.

## 2022-03-23 NOTE — Progress Notes (Signed)
Spoke to the patient about the results of her pathology, which showed an inflammatory polyp and signs of poor blood flow to the colon. I discussed this case with my colleague Dr. Rush Landmark who performs lower EUS. I spoke to the patient and she wants to do whatever is necessary to make sure that there is nothing more serious going on. Thus I recommended that she undergo a pelvic MRI as well as a lower EUS in order to more definitively rule out cancer. The patient agreed to the pelvic MRI and lower EUS. Our team will work on getting her on the schedule. Patient has not yet started her diltiazem ointment yet but plans to get this soon.

## 2022-03-24 ENCOUNTER — Other Ambulatory Visit: Payer: Self-pay

## 2022-03-24 DIAGNOSIS — K6289 Other specified diseases of anus and rectum: Secondary | ICD-10-CM

## 2022-03-24 DIAGNOSIS — R935 Abnormal findings on diagnostic imaging of other abdominal regions, including retroperitoneum: Secondary | ICD-10-CM

## 2022-03-25 ENCOUNTER — Other Ambulatory Visit: Payer: Self-pay

## 2022-03-25 ENCOUNTER — Ambulatory Visit (HOSPITAL_COMMUNITY): Admission: RE | Admit: 2022-03-25 | Payer: Medicare Other | Source: Ambulatory Visit

## 2022-03-25 DIAGNOSIS — K6289 Other specified diseases of anus and rectum: Secondary | ICD-10-CM

## 2022-03-26 ENCOUNTER — Inpatient Hospital Stay (HOSPITAL_COMMUNITY)
Admission: RE | Admit: 2022-03-26 | Discharge: 2022-03-26 | Disposition: A | Discharge status: Home / Self Care | Payer: Medicare Other | Source: Ambulatory Visit | Attending: Internal Medicine | Admitting: Internal Medicine

## 2022-03-26 DIAGNOSIS — K56609 Unspecified intestinal obstruction, unspecified as to partial versus complete obstruction: Secondary | ICD-10-CM | POA: Diagnosis not present

## 2022-03-26 DIAGNOSIS — I10 Essential (primary) hypertension: Secondary | ICD-10-CM | POA: Diagnosis not present

## 2022-03-26 DIAGNOSIS — K651 Peritoneal abscess: Secondary | ICD-10-CM | POA: Diagnosis not present

## 2022-03-26 DIAGNOSIS — R4587 Impulsiveness: Secondary | ICD-10-CM | POA: Diagnosis present

## 2022-03-26 DIAGNOSIS — T50905A Adverse effect of unspecified drugs, medicaments and biological substances, initial encounter: Secondary | ICD-10-CM | POA: Diagnosis present

## 2022-03-26 DIAGNOSIS — K6389 Other specified diseases of intestine: Secondary | ICD-10-CM | POA: Diagnosis not present

## 2022-03-26 DIAGNOSIS — K5730 Diverticulosis of large intestine without perforation or abscess without bleeding: Secondary | ICD-10-CM | POA: Diagnosis not present

## 2022-03-26 DIAGNOSIS — K567 Ileus, unspecified: Secondary | ICD-10-CM | POA: Diagnosis not present

## 2022-03-26 DIAGNOSIS — K5909 Other constipation: Secondary | ICD-10-CM | POA: Diagnosis present

## 2022-03-26 DIAGNOSIS — R935 Abnormal findings on diagnostic imaging of other abdominal regions, including retroperitoneum: Secondary | ICD-10-CM | POA: Insufficient documentation

## 2022-03-26 DIAGNOSIS — L299 Pruritus, unspecified: Secondary | ICD-10-CM | POA: Diagnosis present

## 2022-03-26 DIAGNOSIS — N739 Female pelvic inflammatory disease, unspecified: Secondary | ICD-10-CM | POA: Diagnosis not present

## 2022-03-26 DIAGNOSIS — Z978 Presence of other specified devices: Secondary | ICD-10-CM | POA: Diagnosis not present

## 2022-03-26 DIAGNOSIS — R1110 Vomiting, unspecified: Secondary | ICD-10-CM | POA: Diagnosis not present

## 2022-03-26 DIAGNOSIS — T8143XA Infection following a procedure, organ and space surgical site, initial encounter: Secondary | ICD-10-CM | POA: Diagnosis not present

## 2022-03-26 DIAGNOSIS — R112 Nausea with vomiting, unspecified: Secondary | ICD-10-CM | POA: Diagnosis not present

## 2022-03-26 DIAGNOSIS — K658 Other peritonitis: Secondary | ICD-10-CM | POA: Diagnosis not present

## 2022-03-26 DIAGNOSIS — K828 Other specified diseases of gallbladder: Secondary | ICD-10-CM | POA: Diagnosis not present

## 2022-03-26 DIAGNOSIS — D509 Iron deficiency anemia, unspecified: Secondary | ICD-10-CM | POA: Diagnosis present

## 2022-03-26 DIAGNOSIS — Z4682 Encounter for fitting and adjustment of non-vascular catheter: Secondary | ICD-10-CM | POA: Diagnosis not present

## 2022-03-26 DIAGNOSIS — R338 Other retention of urine: Secondary | ICD-10-CM | POA: Diagnosis present

## 2022-03-26 DIAGNOSIS — I872 Venous insufficiency (chronic) (peripheral): Secondary | ICD-10-CM | POA: Diagnosis present

## 2022-03-26 DIAGNOSIS — E876 Hypokalemia: Secondary | ICD-10-CM | POA: Diagnosis not present

## 2022-03-26 DIAGNOSIS — Z96653 Presence of artificial knee joint, bilateral: Secondary | ICD-10-CM | POA: Diagnosis present

## 2022-03-26 DIAGNOSIS — Z8719 Personal history of other diseases of the digestive system: Secondary | ICD-10-CM | POA: Diagnosis not present

## 2022-03-26 DIAGNOSIS — K6289 Other specified diseases of anus and rectum: Secondary | ICD-10-CM | POA: Insufficient documentation

## 2022-03-26 DIAGNOSIS — R110 Nausea: Secondary | ICD-10-CM | POA: Diagnosis not present

## 2022-03-26 DIAGNOSIS — J302 Other seasonal allergic rhinitis: Secondary | ICD-10-CM | POA: Diagnosis present

## 2022-03-26 DIAGNOSIS — K56699 Other intestinal obstruction unspecified as to partial versus complete obstruction: Secondary | ICD-10-CM | POA: Diagnosis not present

## 2022-03-26 DIAGNOSIS — I700 Atherosclerosis of aorta: Secondary | ICD-10-CM | POA: Diagnosis not present

## 2022-03-26 DIAGNOSIS — K630 Abscess of intestine: Secondary | ICD-10-CM | POA: Diagnosis not present

## 2022-03-26 DIAGNOSIS — I110 Hypertensive heart disease with heart failure: Secondary | ICD-10-CM | POA: Diagnosis present

## 2022-03-26 DIAGNOSIS — K625 Hemorrhage of anus and rectum: Secondary | ICD-10-CM | POA: Diagnosis not present

## 2022-03-26 DIAGNOSIS — K9189 Other postprocedural complications and disorders of digestive system: Secondary | ICD-10-CM | POA: Diagnosis not present

## 2022-03-26 DIAGNOSIS — K631 Perforation of intestine (nontraumatic): Secondary | ICD-10-CM | POA: Diagnosis not present

## 2022-03-26 DIAGNOSIS — G928 Other toxic encephalopathy: Secondary | ICD-10-CM | POA: Diagnosis not present

## 2022-03-26 DIAGNOSIS — K5720 Diverticulitis of large intestine with perforation and abscess without bleeding: Secondary | ICD-10-CM | POA: Diagnosis not present

## 2022-03-26 DIAGNOSIS — K660 Peritoneal adhesions (postprocedural) (postinfection): Secondary | ICD-10-CM | POA: Diagnosis not present

## 2022-03-26 DIAGNOSIS — E039 Hypothyroidism, unspecified: Secondary | ICD-10-CM | POA: Diagnosis not present

## 2022-03-26 DIAGNOSIS — K633 Ulcer of intestine: Secondary | ICD-10-CM | POA: Diagnosis not present

## 2022-03-26 DIAGNOSIS — K5900 Constipation, unspecified: Secondary | ICD-10-CM | POA: Diagnosis not present

## 2022-03-26 DIAGNOSIS — M419 Scoliosis, unspecified: Secondary | ICD-10-CM | POA: Diagnosis not present

## 2022-03-26 DIAGNOSIS — J84112 Idiopathic pulmonary fibrosis: Secondary | ICD-10-CM | POA: Diagnosis not present

## 2022-03-26 DIAGNOSIS — F32A Depression, unspecified: Secondary | ICD-10-CM | POA: Diagnosis present

## 2022-03-26 DIAGNOSIS — D62 Acute posthemorrhagic anemia: Secondary | ICD-10-CM | POA: Diagnosis not present

## 2022-03-26 DIAGNOSIS — I5032 Chronic diastolic (congestive) heart failure: Secondary | ICD-10-CM | POA: Diagnosis not present

## 2022-03-26 DIAGNOSIS — K5939 Other megacolon: Secondary | ICD-10-CM | POA: Diagnosis not present

## 2022-03-26 DIAGNOSIS — E038 Other specified hypothyroidism: Secondary | ICD-10-CM | POA: Diagnosis not present

## 2022-03-26 DIAGNOSIS — K6811 Postprocedural retroperitoneal abscess: Secondary | ICD-10-CM | POA: Diagnosis not present

## 2022-03-26 DIAGNOSIS — C20 Malignant neoplasm of rectum: Secondary | ICD-10-CM | POA: Diagnosis not present

## 2022-03-26 DIAGNOSIS — R109 Unspecified abdominal pain: Secondary | ICD-10-CM | POA: Diagnosis not present

## 2022-03-26 DIAGNOSIS — F05 Delirium due to known physiological condition: Secondary | ICD-10-CM | POA: Diagnosis not present

## 2022-03-26 NOTE — Progress Notes (Signed)
Called and spoke to the patient. She is still having pain in her abdomen and rectum. She started her diltiazem ointment 4 days ago for anal fissure but has not seen any significant benefit so far. I will await the results of her MRI pelvis that is scheduled for later today. We may need to consider a surgery referral in the future.

## 2022-03-27 ENCOUNTER — Telehealth: Payer: Self-pay | Admitting: Family Medicine

## 2022-03-27 ENCOUNTER — Telehealth: Payer: Self-pay | Admitting: Internal Medicine

## 2022-03-27 ENCOUNTER — Other Ambulatory Visit: Payer: Self-pay | Admitting: Family Medicine

## 2022-03-27 DIAGNOSIS — R1084 Generalized abdominal pain: Secondary | ICD-10-CM

## 2022-03-27 MED ORDER — TRAMADOL HCL 50 MG PO TABS: 50.0000 mg | ORAL_TABLET | Freq: Three times a day (TID) | ORAL | 0 refills | Status: DC | PRN

## 2022-03-27 NOTE — Telephone Encounter (Signed)
Spoke w/ Pt- informed that tramadol has been sent in- made her aware if worse over weekend to be seen at [facility] or ED. Pt verbalized understanding.

## 2022-03-27 NOTE — Telephone Encounter (Signed)
Patient states she got a call from [facility] telling her that Dr. Etter Sjogren had referred her to them. She is unsure why she was referred to [place] [facility]. Patient states she is still waiting for GI and they wont be able to see her for a while and she is still hurting. Please advise.

## 2022-03-27 NOTE — Telephone Encounter (Signed)
Inbound call from patient stating that she can not sleep, sit, stand due to her pain and she does not know what she needs to do. Please advise.

## 2022-03-27 NOTE — Telephone Encounter (Signed)
Please advise 

## 2022-03-27 NOTE — Telephone Encounter (Signed)
Spoke w/ Pt- informed of PCP recommendations, states earliest they can see her is 04/07/22, she is requesting something for pain in the meantime.

## 2022-03-27 NOTE — Telephone Encounter (Signed)
Across the abdomen low "like when you need to go to the bathroom and cannot, I cannot." She is having difficulty with urination as well.

## 2022-03-27 NOTE — Telephone Encounter (Signed)
Spoke with the patient. She report she is unable to "poop or pee." Recommended she go to the ED.

## 2022-03-27 NOTE — Telephone Encounter (Signed)
Patient advised. MRI results are pending.

## 2022-03-28 ENCOUNTER — Encounter (HOSPITAL_COMMUNITY): Payer: Self-pay

## 2022-03-28 ENCOUNTER — Other Ambulatory Visit: Payer: Self-pay

## 2022-03-28 ENCOUNTER — Encounter (HOSPITAL_BASED_OUTPATIENT_CLINIC_OR_DEPARTMENT_OTHER): Payer: Self-pay | Admitting: Emergency Medicine

## 2022-03-28 ENCOUNTER — Inpatient Hospital Stay (HOSPITAL_BASED_OUTPATIENT_CLINIC_OR_DEPARTMENT_OTHER)
Admission: EM | Admit: 2022-03-28 | Discharge: 2022-04-21 | DRG: 329 | Disposition: A | Discharge status: Home Health | Payer: Medicare Other | Attending: Internal Medicine | Admitting: Internal Medicine

## 2022-03-28 DIAGNOSIS — Z8719 Personal history of other diseases of the digestive system: Secondary | ICD-10-CM

## 2022-03-28 DIAGNOSIS — Z8049 Family history of malignant neoplasm of other genital organs: Secondary | ICD-10-CM

## 2022-03-28 DIAGNOSIS — N739 Female pelvic inflammatory disease, unspecified: Secondary | ICD-10-CM | POA: Diagnosis not present

## 2022-03-28 DIAGNOSIS — Z888 Allergy status to other drugs, medicaments and biological substances status: Secondary | ICD-10-CM

## 2022-03-28 DIAGNOSIS — Z79899 Other long term (current) drug therapy: Secondary | ICD-10-CM

## 2022-03-28 DIAGNOSIS — K5900 Constipation, unspecified: Secondary | ICD-10-CM | POA: Diagnosis not present

## 2022-03-28 DIAGNOSIS — I5032 Chronic diastolic (congestive) heart failure: Secondary | ICD-10-CM | POA: Diagnosis present

## 2022-03-28 DIAGNOSIS — K5720 Diverticulitis of large intestine with perforation and abscess without bleeding: Principal | ICD-10-CM | POA: Diagnosis present

## 2022-03-28 DIAGNOSIS — K6289 Other specified diseases of anus and rectum: Secondary | ICD-10-CM

## 2022-03-28 DIAGNOSIS — Z86718 Personal history of other venous thrombosis and embolism: Secondary | ICD-10-CM

## 2022-03-28 DIAGNOSIS — E039 Hypothyroidism, unspecified: Secondary | ICD-10-CM | POA: Diagnosis present

## 2022-03-28 DIAGNOSIS — Z8673 Personal history of transient ischemic attack (TIA), and cerebral infarction without residual deficits: Secondary | ICD-10-CM

## 2022-03-28 DIAGNOSIS — D62 Acute posthemorrhagic anemia: Secondary | ICD-10-CM | POA: Diagnosis present

## 2022-03-28 DIAGNOSIS — Z96653 Presence of artificial knee joint, bilateral: Secondary | ICD-10-CM | POA: Diagnosis present

## 2022-03-28 DIAGNOSIS — T50905A Adverse effect of unspecified drugs, medicaments and biological substances, initial encounter: Secondary | ICD-10-CM | POA: Diagnosis present

## 2022-03-28 DIAGNOSIS — J84112 Idiopathic pulmonary fibrosis: Secondary | ICD-10-CM | POA: Diagnosis present

## 2022-03-28 DIAGNOSIS — Z79890 Hormone replacement therapy (postmenopausal): Secondary | ICD-10-CM

## 2022-03-28 DIAGNOSIS — R112 Nausea with vomiting, unspecified: Secondary | ICD-10-CM | POA: Diagnosis not present

## 2022-03-28 DIAGNOSIS — Z9841 Cataract extraction status, right eye: Secondary | ICD-10-CM

## 2022-03-28 DIAGNOSIS — B966 Bacteroides fragilis [B. fragilis] as the cause of diseases classified elsewhere: Secondary | ICD-10-CM | POA: Diagnosis not present

## 2022-03-28 DIAGNOSIS — Z7982 Long term (current) use of aspirin: Secondary | ICD-10-CM

## 2022-03-28 DIAGNOSIS — R109 Unspecified abdominal pain: Secondary | ICD-10-CM | POA: Diagnosis present

## 2022-03-28 DIAGNOSIS — Z9049 Acquired absence of other specified parts of digestive tract: Secondary | ICD-10-CM

## 2022-03-28 DIAGNOSIS — I110 Hypertensive heart disease with heart failure: Secondary | ICD-10-CM | POA: Diagnosis present

## 2022-03-28 DIAGNOSIS — R338 Other retention of urine: Secondary | ICD-10-CM | POA: Diagnosis present

## 2022-03-28 DIAGNOSIS — Z8041 Family history of malignant neoplasm of ovary: Secondary | ICD-10-CM

## 2022-03-28 DIAGNOSIS — K567 Ileus, unspecified: Secondary | ICD-10-CM | POA: Diagnosis not present

## 2022-03-28 DIAGNOSIS — T7840XA Allergy, unspecified, initial encounter: Secondary | ICD-10-CM | POA: Insufficient documentation

## 2022-03-28 DIAGNOSIS — D509 Iron deficiency anemia, unspecified: Secondary | ICD-10-CM | POA: Diagnosis present

## 2022-03-28 DIAGNOSIS — Z90710 Acquired absence of both cervix and uterus: Secondary | ICD-10-CM

## 2022-03-28 DIAGNOSIS — F32A Depression, unspecified: Secondary | ICD-10-CM | POA: Diagnosis present

## 2022-03-28 DIAGNOSIS — I10 Essential (primary) hypertension: Secondary | ICD-10-CM | POA: Diagnosis present

## 2022-03-28 DIAGNOSIS — Z85828 Personal history of other malignant neoplasm of skin: Secondary | ICD-10-CM

## 2022-03-28 DIAGNOSIS — R110 Nausea: Secondary | ICD-10-CM

## 2022-03-28 DIAGNOSIS — K5909 Other constipation: Principal | ICD-10-CM

## 2022-03-28 DIAGNOSIS — Z8249 Family history of ischemic heart disease and other diseases of the circulatory system: Secondary | ICD-10-CM

## 2022-03-28 DIAGNOSIS — I872 Venous insufficiency (chronic) (peripheral): Secondary | ICD-10-CM | POA: Diagnosis present

## 2022-03-28 DIAGNOSIS — R4587 Impulsiveness: Secondary | ICD-10-CM | POA: Diagnosis present

## 2022-03-28 DIAGNOSIS — K9189 Other postprocedural complications and disorders of digestive system: Secondary | ICD-10-CM | POA: Diagnosis not present

## 2022-03-28 DIAGNOSIS — K651 Peritoneal abscess: Secondary | ICD-10-CM | POA: Diagnosis present

## 2022-03-28 DIAGNOSIS — G928 Other toxic encephalopathy: Secondary | ICD-10-CM | POA: Diagnosis present

## 2022-03-28 DIAGNOSIS — Z887 Allergy status to serum and vaccine status: Secondary | ICD-10-CM

## 2022-03-28 DIAGNOSIS — I5189 Other ill-defined heart diseases: Secondary | ICD-10-CM

## 2022-03-28 DIAGNOSIS — J302 Other seasonal allergic rhinitis: Secondary | ICD-10-CM | POA: Diagnosis present

## 2022-03-28 DIAGNOSIS — E876 Hypokalemia: Secondary | ICD-10-CM | POA: Diagnosis not present

## 2022-03-28 DIAGNOSIS — Z800 Family history of malignant neoplasm of digestive organs: Secondary | ICD-10-CM

## 2022-03-28 DIAGNOSIS — B952 Enterococcus as the cause of diseases classified elsewhere: Secondary | ICD-10-CM | POA: Diagnosis not present

## 2022-03-28 DIAGNOSIS — I252 Old myocardial infarction: Secondary | ICD-10-CM

## 2022-03-28 DIAGNOSIS — K5730 Diverticulosis of large intestine without perforation or abscess without bleeding: Secondary | ICD-10-CM

## 2022-03-28 DIAGNOSIS — F05 Delirium due to known physiological condition: Secondary | ICD-10-CM | POA: Diagnosis not present

## 2022-03-28 DIAGNOSIS — K625 Hemorrhage of anus and rectum: Secondary | ICD-10-CM | POA: Diagnosis present

## 2022-03-28 DIAGNOSIS — L299 Pruritus, unspecified: Secondary | ICD-10-CM | POA: Diagnosis present

## 2022-03-28 DIAGNOSIS — Z885 Allergy status to narcotic agent status: Secondary | ICD-10-CM

## 2022-03-28 LAB — CBC WITH DIFFERENTIAL/PLATELET
Abs Immature Granulocytes: 0.05 10*3/uL (ref 0.00–0.07)
Basophils Absolute: 0.1 10*3/uL (ref 0.0–0.1)
Basophils Relative: 1 %
Eosinophils Absolute: 0.1 10*3/uL (ref 0.0–0.5)
Eosinophils Relative: 1 %
HCT: 38.1 % (ref 36.0–46.0)
Hemoglobin: 12.5 g/dL (ref 12.0–15.0)
Immature Granulocytes: 0 %
Lymphocytes Relative: 9 %
Lymphs Abs: 1.2 10*3/uL (ref 0.7–4.0)
MCH: 32.7 pg (ref 26.0–34.0)
MCHC: 32.8 g/dL (ref 30.0–36.0)
MCV: 99.7 fL (ref 80.0–100.0)
Monocytes Absolute: 2.2 10*3/uL — ABNORMAL HIGH (ref 0.1–1.0)
Monocytes Relative: 17 %
Neutro Abs: 9.7 10*3/uL — ABNORMAL HIGH (ref 1.7–7.7)
Neutrophils Relative %: 72 %
Platelets: 325 10*3/uL (ref 150–400)
RBC: 3.82 MIL/uL — ABNORMAL LOW (ref 3.87–5.11)
RDW: 14.6 % (ref 11.5–15.5)
WBC: 13.4 10*3/uL — ABNORMAL HIGH (ref 4.0–10.5)
nRBC: 0 % (ref 0.0–0.2)

## 2022-03-28 LAB — COMPREHENSIVE METABOLIC PANEL
ALT: 13 U/L (ref 0–44)
AST: 19 U/L (ref 15–41)
Albumin: 3.1 g/dL — ABNORMAL LOW (ref 3.5–5.0)
Alkaline Phosphatase: 77 U/L (ref 38–126)
Anion gap: 7 (ref 5–15)
BUN: 13 mg/dL (ref 8–23)
CO2: 30 mmol/L (ref 22–32)
Calcium: 8.6 mg/dL — ABNORMAL LOW (ref 8.9–10.3)
Chloride: 95 mmol/L — ABNORMAL LOW (ref 98–111)
Creatinine, Ser: 0.84 mg/dL (ref 0.44–1.00)
GFR, Estimated: >60 mL/min (ref >60)
Glucose, Bld: 116 mg/dL — ABNORMAL HIGH (ref 70–99)
Potassium: 3.7 mmol/L (ref 3.5–5.1)
Sodium: 132 mmol/L — ABNORMAL LOW (ref 135–145)
Total Bilirubin: 1 mg/dL (ref 0.3–1.2)
Total Protein: 8.1 g/dL (ref 6.5–8.1)

## 2022-03-28 LAB — LIPASE, BLOOD: Lipase: 25 U/L (ref 11–51)

## 2022-03-28 MED ORDER — ACETAMINOPHEN 500 MG PO TABS
1000.0000 mg | ORAL_TABLET | Freq: Once | ORAL | Status: DC
  Filled 2022-03-28: qty 2

## 2022-03-28 MED ORDER — MORPHINE SULFATE (PF) 4 MG/ML IV SOLN
4.0000 mg | Freq: Once | INTRAVENOUS | Status: AC
  Administered 2022-03-28: 4 mg via INTRAVENOUS
  Filled 2022-03-28: qty 1

## 2022-03-28 MED ORDER — ONDANSETRON HCL 4 MG/2ML IJ SOLN
4.0000 mg | Freq: Once | INTRAMUSCULAR | Status: AC
  Administered 2022-03-28: 4 mg via INTRAVENOUS
  Filled 2022-03-28: qty 2

## 2022-03-28 MED ORDER — SODIUM CHLORIDE 0.9 % IV BOLUS
1000.0000 mL | Freq: Once | INTRAVENOUS | Status: AC
  Administered 2022-03-28: 1000 mL via INTRAVENOUS

## 2022-03-28 NOTE — ED Provider Notes (Signed)
[name] Provider Note   CSN: [id number] Arrival date & time: 03/28/22  1719     History  Chief Complaint  Patient presents with   Abdominal Pain    [name] is a 76 y.o. female.  The history is provided by the patient and medical records. No language interpreter was used.  Abdominal Pain    76 year old female with significant history of diverticular disease, presenting with complaints of abdominal pain.  Patient report for the past several weeks she has had lower abdominal discomfort as well as not having a bowel movement for the past 7 days.  She endorsed nausea without vomiting.  She endorsed fatigue.  She mention she has been seen evaluated for her symptoms and was told that she has a "blockage" and she would like to have it removed unfortunately she report her next appointment with general surgery is not until January and she cannot wait that long.  She continues to endorse difficulty having bowel movement and afraid to eat.  She does not endorse any fever chills no productive cough no dysuria and no rectal bleeding.  Pain is moderate in severity.  Home Medications Prior to Admission medications   Medication Sig Start Date End Date Taking? Authorizing Provider  AMBULATORY NON FORMULARY MEDICATION Medication Name: Diltiazem 2% with Lidocaine 5%- use index finger, apply small amount of medication inside the rectum up to your first knuckle/joint for 8 weeks 03/20/22   Sharyn Creamer, MD  aspirin EC 81 MG tablet Take 81 mg by mouth daily. Swallow whole.    [provider]  benzonatate (TESSALON) 100 MG capsule Take 200 mg by mouth 3 (three) times daily as needed. 03/06/22   [provider]  Cholecalciferol (VITAMIN D3) 125 MCG (5000 UT) CAPS Take 5,000 Units by mouth daily.    [provider]  furosemide (LASIX) 40 MG tablet TAKE 1 TABLET BY MOUTH TWICE A DAY 09/03/21   Lowne Chase, Yvonne R, DO  KLOR-CON M20 20 MEQ  tablet Take 1 tablet (20 mEq total) by mouth 2 (two) times daily. Will take 3 a daily only as needed. 01/06/22   [name], DO  linaclotide (LINZESS) 145 MCG CAPS capsule Take 1 capsule (145 mcg total) by mouth daily before breakfast. 03/17/22   Sharyn Creamer, MD  Multiple Vitamin (MULTIVITAMIN) tablet Take 1 tablet by mouth every other day. Gluten free/Vegetarian Multivitamin    [provider]  Nintedanib (OFEV) 100 MG CAPS Take by mouth. 07/03/20   [provider]  ondansetron (ZOFRAN-ODT) 4 MG disintegrating tablet TAKE 1 TABLET BY MOUTH EVERY 8 HOURS AS NEEDED FOR NAUSEA AND VOMITING 07/18/21   Carollee Herter, Alferd Apa, DO  SYNTHROID 112 MCG tablet Take 1 tablet (112 mcg total) by mouth daily before breakfast. 03/02/22   Carollee Herter, Alferd Apa, DO  traMADol (ULTRAM) 50 MG tablet Take 1 tablet (50 mg total) by mouth every 8 (eight) hours as needed for up to 5 days. 03/27/22 04/01/22  [name], DO  vitamin C (ASCORBIC ACID) 500 MG tablet Take 1,000 mg by mouth 2 (two) times daily.     [provider]  vitamin E 1000 UNIT capsule Take 1,000 Units by mouth daily.    [provider]      Allergies    Prednisone, Atrovent hfa [ipratropium bromide hfa], Cheratussin ac [guaifenesin-codeine], Codeine, Diltiazem, Hydrocodone, Influenza vaccines, Motrin [ibuprofen], and Oxycodone-acetaminophen    Review of Systems  Review of Systems  Gastrointestinal:  Positive for abdominal pain.  All other systems reviewed and are negative.   Physical Exam Updated Vital Signs BP 138/77 (BP Location: Right Arm)   Pulse 94   Temp 97.6 F (36.4 C) (Oral)   Resp 20   Ht '5\' 4"'$  (1.626 m)   Wt 63 kg   SpO2 97%   BMI 23.84 kg/m  Physical Exam Vitals and nursing note reviewed.  Constitutional:      General: She is not in acute distress.    Appearance: She is well-developed.  HENT:     Head: Atraumatic.  Eyes:     Conjunctiva/sclera: Conjunctivae  normal.  Cardiovascular:     Rate and Rhythm: Normal rate and regular rhythm.  Pulmonary:     Effort: Pulmonary effort is normal.  Abdominal:     General: Bowel sounds are increased.     Tenderness: There is generalized abdominal tenderness.  Musculoskeletal:     Cervical back: Neck supple.  Skin:    Findings: No rash.  Neurological:     Mental Status: She is alert.  Psychiatric:        Mood and Affect: Mood normal.     ED Results / Procedures / Treatments   Labs (all labs ordered are listed, but only abnormal results are displayed) Labs Reviewed  CBC WITH DIFFERENTIAL/PLATELET - Abnormal; Notable for the following components:      Result Value   WBC 13.4 (*)    RBC 3.82 (*)    Neutro Abs 9.7 (*)    Monocytes Absolute 2.2 (*)    All other components within normal limits  COMPREHENSIVE METABOLIC PANEL - Abnormal; Notable for the following components:   Sodium 132 (*)    Chloride 95 (*)    Glucose, Bld 116 (*)    Calcium 8.6 (*)    Albumin 3.1 (*)    All other components within normal limits  LIPASE, BLOOD  URINALYSIS, ROUTINE W REFLEX MICROSCOPIC    EKG None  Radiology No results found.  Procedures Procedures    Medications Ordered in ED Medications  sodium chloride 0.9 % bolus 1,000 mL (has no administration in time range)  ondansetron (ZOFRAN) injection 4 mg (has no administration in time range)  morphine (PF) 4 MG/ML injection 4 mg (has no administration in time range)    ED Course/ Medical Decision Making/ A&P                           Medical Decision Making Amount and/or Complexity of Data Reviewed Labs: ordered.  Risk Prescription drug management. Decision regarding hospitalization.   BP 138/77 (BP Location: Right Arm)   Pulse 94   Temp 97.6 F (36.4 C) (Oral)   Resp 20   Ht '5\' 4"'$  (1.626 m)   Wt 63 kg   SpO2 97%   BMI 23.84 kg/m   21:39 PM  76 year old female with significant history of diverticular disease, presenting with  complaints of abdominal pain.  Patient report for the past several weeks she has had lower abdominal discomfort as well as not having a bowel movement for the past 7 days.  She endorsed nausea without vomiting.  She endorsed fatigue.  She mention she has been seen evaluated for her symptoms and was told that she has a "blockage" and she would like to have it removed unfortunately she report her next appointment with general surgery is not until January and she  cannot wait that long.  She continues to endorse difficulty having bowel movement and afraid to eat.  She does not endorse any fever chills no productive cough no dysuria and no rectal bleeding.  Pain is moderate in severity.  On exam this is an elderly female laying in bed appears frustrated.  Heart with normal rate and rhythm, lungs are clear to auscultation abdomen is diffusely tender but nondistended.  Bowel sounds present.  Vital signs overall reassuring, no fever, no hypoxia, normal blood pressure.  7:12 PM Spoke with general surgery Dr. Kae Heller who agrees to be involve in pt care if she needs admission to medicine.  Pt may benefit from Diverting colostomy if she is obstructed.    -Labs ordered, independently viewed and interpreted by me.  Labs remarkable for WBC 13.4, suspect from stress demargination and less likely infection.  -The patient was maintained on a cardiac monitor.  I personally viewed and interpreted the cardiac monitored which showed an underlying rhythm of: NSR -Imaging independently viewed and interpreted by me and I agree with radiologist's interpretation.  Result remarkable for recent abd/pelvis MRI done outpt showing rectal mass, similar to recent abd/pelvis CT. -This patient presents to the ED for concern of constipation, this involves an extensive number of treatment options, and is a complaint that carries with it a high risk of complications and morbidity.  The differential diagnosis includes bowel obstruction 2/2 rectal  mass, functional constipation, diverticular disease -Co morbidities that complicate the patient evaluation includes rectal mass -Treatment includes tylenol, zofran, IVF -Reevaluation of the patient after these medicines showed that the patient stayed the same -PCP office notes or outside notes reviewed -Discussion with specialist general surgery Dr. Kae Heller who recommend admission to medicine if pt unable to tolerates her discomfort.  She may benefit from a diverting colostomy -Escalation to admission/observation considered: patient is in agreement with admission for further management  10:40 PM Patient endorsed persistent abdominal discomfort.  Will provide pain medication for better management of his symptoms.  I appreciate consultation from hospitalist, Dr. Marlowe Sax who agrees to admit patient to [facility] for further management of symptoms.         Final Clinical Impression(s) / ED Diagnoses Final diagnoses:  Other constipation  Nausea  Rectal mass    Rx / DC Orders ED Discharge Orders     None         Domenic Moras, PA-C 03/28/22 2243    Tegeler, Gwenyth Allegra, MD 03/28/22 2302

## 2022-03-28 NOTE — Plan of Care (Signed)
Spoke to PA Domenic Moras.  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  She had a recent abdominal CT done on 12/15 which was showing a rectal mass.  She was referred to GI and had an MRI done 2 days ago but results have not been posted yet.  Has mild leukocytosis on labs but no fever.  Sodium 132.  Lipase and LFTs normal.  Patient was given Tylenol, morphine, Zofran, and 1 L fluid bolus.  Case was discussed with Dr. Kae Heller who felt that the patient may need a diverting colostomy.  Recommended admission by medicine service and general surgery will consult.  [facility] will assume care on arrival to accepting facility. Until arrival, care as per EDP. However, [facility] available 24/7 for questions and assistance.  Nursing staff, please page [facility] and Consults [phone]) as soon as the patient arrives to the hospital.

## 2022-03-28 NOTE — ED Notes (Signed)
No distress noted oin Pt. However Pt. Continues to say she has a blockage and needs to get it fixed.  No vomiting noted in Pt. And no c/o nausea from the Pt.

## 2022-03-28 NOTE — ED Triage Notes (Signed)
Patient states she has a bowel blockage and has not had a bowel movement in approx. 7 days.

## 2022-03-29 ENCOUNTER — Inpatient Hospital Stay (HOSPITAL_COMMUNITY): Payer: Medicare Other

## 2022-03-29 ENCOUNTER — Encounter (HOSPITAL_COMMUNITY): Payer: Self-pay | Admitting: Internal Medicine

## 2022-03-29 DIAGNOSIS — K660 Peritoneal adhesions (postprocedural) (postinfection): Secondary | ICD-10-CM | POA: Diagnosis not present

## 2022-03-29 DIAGNOSIS — E038 Other specified hypothyroidism: Secondary | ICD-10-CM | POA: Diagnosis not present

## 2022-03-29 DIAGNOSIS — K625 Hemorrhage of anus and rectum: Secondary | ICD-10-CM | POA: Diagnosis not present

## 2022-03-29 DIAGNOSIS — K633 Ulcer of intestine: Secondary | ICD-10-CM | POA: Diagnosis not present

## 2022-03-29 DIAGNOSIS — Z8719 Personal history of other diseases of the digestive system: Secondary | ICD-10-CM | POA: Diagnosis not present

## 2022-03-29 DIAGNOSIS — R1110 Vomiting, unspecified: Secondary | ICD-10-CM | POA: Diagnosis not present

## 2022-03-29 DIAGNOSIS — K567 Ileus, unspecified: Secondary | ICD-10-CM | POA: Diagnosis not present

## 2022-03-29 DIAGNOSIS — R338 Other retention of urine: Secondary | ICD-10-CM | POA: Diagnosis present

## 2022-03-29 DIAGNOSIS — M419 Scoliosis, unspecified: Secondary | ICD-10-CM | POA: Diagnosis not present

## 2022-03-29 DIAGNOSIS — Z978 Presence of other specified devices: Secondary | ICD-10-CM | POA: Diagnosis not present

## 2022-03-29 DIAGNOSIS — K6811 Postprocedural retroperitoneal abscess: Secondary | ICD-10-CM | POA: Diagnosis not present

## 2022-03-29 DIAGNOSIS — K658 Other peritonitis: Secondary | ICD-10-CM | POA: Diagnosis not present

## 2022-03-29 DIAGNOSIS — E039 Hypothyroidism, unspecified: Secondary | ICD-10-CM | POA: Diagnosis present

## 2022-03-29 DIAGNOSIS — K631 Perforation of intestine (nontraumatic): Secondary | ICD-10-CM | POA: Diagnosis not present

## 2022-03-29 DIAGNOSIS — K6389 Other specified diseases of intestine: Secondary | ICD-10-CM | POA: Diagnosis not present

## 2022-03-29 DIAGNOSIS — K56609 Unspecified intestinal obstruction, unspecified as to partial versus complete obstruction: Secondary | ICD-10-CM | POA: Diagnosis not present

## 2022-03-29 DIAGNOSIS — K5909 Other constipation: Secondary | ICD-10-CM | POA: Diagnosis present

## 2022-03-29 DIAGNOSIS — K56699 Other intestinal obstruction unspecified as to partial versus complete obstruction: Secondary | ICD-10-CM | POA: Diagnosis not present

## 2022-03-29 DIAGNOSIS — F32A Depression, unspecified: Secondary | ICD-10-CM | POA: Diagnosis present

## 2022-03-29 DIAGNOSIS — Z96653 Presence of artificial knee joint, bilateral: Secondary | ICD-10-CM | POA: Diagnosis present

## 2022-03-29 DIAGNOSIS — J302 Other seasonal allergic rhinitis: Secondary | ICD-10-CM | POA: Diagnosis present

## 2022-03-29 DIAGNOSIS — R4587 Impulsiveness: Secondary | ICD-10-CM | POA: Diagnosis present

## 2022-03-29 DIAGNOSIS — Z4682 Encounter for fitting and adjustment of non-vascular catheter: Secondary | ICD-10-CM | POA: Diagnosis not present

## 2022-03-29 DIAGNOSIS — L299 Pruritus, unspecified: Secondary | ICD-10-CM | POA: Diagnosis present

## 2022-03-29 DIAGNOSIS — K630 Abscess of intestine: Secondary | ICD-10-CM | POA: Diagnosis not present

## 2022-03-29 DIAGNOSIS — I10 Essential (primary) hypertension: Secondary | ICD-10-CM | POA: Diagnosis not present

## 2022-03-29 DIAGNOSIS — K6289 Other specified diseases of anus and rectum: Secondary | ICD-10-CM | POA: Diagnosis not present

## 2022-03-29 DIAGNOSIS — I5032 Chronic diastolic (congestive) heart failure: Secondary | ICD-10-CM | POA: Diagnosis present

## 2022-03-29 DIAGNOSIS — I700 Atherosclerosis of aorta: Secondary | ICD-10-CM | POA: Diagnosis not present

## 2022-03-29 DIAGNOSIS — G928 Other toxic encephalopathy: Secondary | ICD-10-CM | POA: Diagnosis not present

## 2022-03-29 DIAGNOSIS — K828 Other specified diseases of gallbladder: Secondary | ICD-10-CM | POA: Diagnosis not present

## 2022-03-29 DIAGNOSIS — F05 Delirium due to known physiological condition: Secondary | ICD-10-CM | POA: Diagnosis not present

## 2022-03-29 DIAGNOSIS — R109 Unspecified abdominal pain: Secondary | ICD-10-CM | POA: Diagnosis not present

## 2022-03-29 DIAGNOSIS — T50905A Adverse effect of unspecified drugs, medicaments and biological substances, initial encounter: Secondary | ICD-10-CM | POA: Diagnosis present

## 2022-03-29 DIAGNOSIS — T8143XA Infection following a procedure, organ and space surgical site, initial encounter: Secondary | ICD-10-CM | POA: Diagnosis not present

## 2022-03-29 DIAGNOSIS — I110 Hypertensive heart disease with heart failure: Secondary | ICD-10-CM | POA: Diagnosis present

## 2022-03-29 DIAGNOSIS — N739 Female pelvic inflammatory disease, unspecified: Secondary | ICD-10-CM | POA: Diagnosis not present

## 2022-03-29 DIAGNOSIS — J84112 Idiopathic pulmonary fibrosis: Secondary | ICD-10-CM | POA: Diagnosis present

## 2022-03-29 DIAGNOSIS — D509 Iron deficiency anemia, unspecified: Secondary | ICD-10-CM | POA: Diagnosis present

## 2022-03-29 DIAGNOSIS — T7840XA Allergy, unspecified, initial encounter: Secondary | ICD-10-CM | POA: Insufficient documentation

## 2022-03-29 DIAGNOSIS — K651 Peritoneal abscess: Secondary | ICD-10-CM | POA: Diagnosis present

## 2022-03-29 DIAGNOSIS — R110 Nausea: Secondary | ICD-10-CM | POA: Diagnosis not present

## 2022-03-29 DIAGNOSIS — D62 Acute posthemorrhagic anemia: Secondary | ICD-10-CM | POA: Diagnosis not present

## 2022-03-29 DIAGNOSIS — K5730 Diverticulosis of large intestine without perforation or abscess without bleeding: Secondary | ICD-10-CM | POA: Diagnosis not present

## 2022-03-29 DIAGNOSIS — I872 Venous insufficiency (chronic) (peripheral): Secondary | ICD-10-CM | POA: Diagnosis present

## 2022-03-29 DIAGNOSIS — K5939 Other megacolon: Secondary | ICD-10-CM | POA: Diagnosis not present

## 2022-03-29 DIAGNOSIS — K5900 Constipation, unspecified: Secondary | ICD-10-CM | POA: Diagnosis not present

## 2022-03-29 DIAGNOSIS — E876 Hypokalemia: Secondary | ICD-10-CM | POA: Diagnosis not present

## 2022-03-29 DIAGNOSIS — K5720 Diverticulitis of large intestine with perforation and abscess without bleeding: Secondary | ICD-10-CM | POA: Diagnosis present

## 2022-03-29 DIAGNOSIS — K9189 Other postprocedural complications and disorders of digestive system: Secondary | ICD-10-CM | POA: Diagnosis not present

## 2022-03-29 LAB — GLUCOSE, CAPILLARY: Glucose-Capillary: 103 mg/dL — ABNORMAL HIGH (ref 70–99)

## 2022-03-29 MED ORDER — POTASSIUM CHLORIDE CRYS ER 20 MEQ PO TBCR
20.0000 meq | EXTENDED_RELEASE_TABLET | Freq: Two times a day (BID) | ORAL | Status: DC
  Administered 2022-03-29 – 2022-03-30 (×4): 20 meq via ORAL
  Filled 2022-03-29 (×5): qty 1

## 2022-03-29 MED ORDER — KETOROLAC TROMETHAMINE 15 MG/ML IJ SOLN
15.0000 mg | Freq: Four times a day (QID) | INTRAMUSCULAR | Status: DC | PRN
  Administered 2022-03-29 – 2022-03-31 (×4): 15 mg via INTRAVENOUS
  Filled 2022-03-29 (×4): qty 1

## 2022-03-29 MED ORDER — LEVOTHYROXINE SODIUM 112 MCG PO TABS
112.0000 ug | ORAL_TABLET | Freq: Every day | ORAL | Status: DC
  Administered 2022-03-29 – 2022-04-02 (×5): 112 ug via ORAL
  Filled 2022-03-29 (×5): qty 1

## 2022-03-29 MED ORDER — LIDOCAINE 5 % EX OINT
TOPICAL_OINTMENT | Freq: Three times a day (TID) | CUTANEOUS | Status: DC | PRN
  Filled 2022-03-29: qty 35.44

## 2022-03-29 MED ORDER — HYDROMORPHONE HCL 1 MG/ML IJ SOLN
1.0000 mg | Freq: Once | INTRAMUSCULAR | Status: AC
  Administered 2022-03-29: 1 mg via INTRAVENOUS
  Filled 2022-03-29: qty 1

## 2022-03-29 MED ORDER — DILTIAZEM GEL 2 %: Freq: Three times a day (TID) | CUTANEOUS | Status: DC

## 2022-03-29 MED ORDER — NITROGLYCERIN 2 % TD OINT
0.5000 [in_us] | TOPICAL_OINTMENT | Freq: Four times a day (QID) | TRANSDERMAL | Status: DC
  Administered 2022-03-29 – 2022-04-02 (×4): 0.5 [in_us] via TOPICAL
  Filled 2022-03-29: qty 30

## 2022-03-29 MED ORDER — TRAMADOL HCL 50 MG PO TABS
50.0000 mg | ORAL_TABLET | Freq: Three times a day (TID) | ORAL | Status: DC | PRN
  Administered 2022-03-29 – 2022-03-30 (×2): 50 mg via ORAL
  Filled 2022-03-29 (×2): qty 1

## 2022-03-29 MED ORDER — NINTEDANIB ESYLATE 100 MG PO CAPS: 100.0000 mg | ORAL_CAPSULE | Freq: Two times a day (BID) | ORAL | Status: DC

## 2022-03-29 MED ORDER — SODIUM CHLORIDE 0.45 % IV SOLN: INTRAVENOUS | Status: DC

## 2022-03-29 MED ORDER — LACTULOSE 10 GM/15ML PO SOLN
20.0000 g | Freq: Two times a day (BID) | ORAL | Status: DC
  Administered 2022-03-29 – 2022-03-30 (×2): 20 g via ORAL
  Filled 2022-03-29 (×2): qty 30

## 2022-03-29 MED ORDER — ASPIRIN 81 MG PO TBEC
81.0000 mg | DELAYED_RELEASE_TABLET | Freq: Every day | ORAL | Status: DC
  Administered 2022-03-29 – 2022-04-02 (×4): 81 mg via ORAL
  Filled 2022-03-29 (×6): qty 1

## 2022-03-29 MED ORDER — DIPHENHYDRAMINE HCL 25 MG PO CAPS
25.0000 mg | ORAL_CAPSULE | Freq: Four times a day (QID) | ORAL | Status: DC | PRN
  Administered 2022-03-29: 25 mg via ORAL
  Filled 2022-03-29: qty 1

## 2022-03-29 MED ORDER — IOHEXOL 300 MG/ML  SOLN
100.0000 mL | Freq: Once | INTRAMUSCULAR | Status: AC | PRN
  Administered 2022-03-29: 100 mL via INTRAVENOUS

## 2022-03-29 MED ORDER — LACTULOSE 10 GM/15ML PO SOLN
20.0000 g | Freq: Every day | ORAL | Status: DC
  Administered 2022-03-29: 20 g via ORAL
  Filled 2022-03-29: qty 30

## 2022-03-29 MED ORDER — FUROSEMIDE 40 MG PO TABS
40.0000 mg | ORAL_TABLET | Freq: Two times a day (BID) | ORAL | Status: DC
  Administered 2022-03-29 – 2022-03-30 (×4): 40 mg via ORAL
  Filled 2022-03-29 (×5): qty 1

## 2022-03-29 MED ORDER — FAMOTIDINE 20 MG PO TABS: 10.0000 mg | ORAL_TABLET | Freq: Two times a day (BID) | ORAL | Status: DC | PRN

## 2022-03-29 MED ORDER — ENOXAPARIN SODIUM 40 MG/0.4ML IJ SOSY
40.0000 mg | PREFILLED_SYRINGE | INTRAMUSCULAR | Status: DC
  Administered 2022-03-29: 40 mg via SUBCUTANEOUS
  Filled 2022-03-29: qty 0.4

## 2022-03-29 MED ORDER — ONDANSETRON 4 MG PO TBDP
4.0000 mg | ORAL_TABLET | Freq: Three times a day (TID) | ORAL | Status: DC | PRN
  Administered 2022-03-30: 4 mg via ORAL
  Filled 2022-03-29: qty 1

## 2022-03-29 MED ORDER — PIPERACILLIN-TAZOBACTAM 3.375 G IVPB
3.3750 g | Freq: Three times a day (TID) | INTRAVENOUS | Status: DC
  Administered 2022-03-29 – 2022-04-05 (×21): 3.375 g via INTRAVENOUS
  Filled 2022-03-29 (×21): qty 50

## 2022-03-29 NOTE — Assessment & Plan Note (Signed)
Enhancing mass seen on CT. Flex sig reveals polypoid mass with benign biopsy. Rectum was not obstructed. MRI done 03/27/22 with read pending. On exam - hypoactive BS. Obstruction unlikely.  Plan Per MRI results  GS to see - aGI vs surgical polypectomy if indicated.

## 2022-03-29 NOTE — Progress Notes (Signed)
Nurse was informed by answering service, that Dr. Linda Hedges would be up to see the pt this morning.

## 2022-03-29 NOTE — Progress Notes (Signed)
Note: Portions of this report may have been transcribed using voice recognition software. Every effort was made to ensure accuracy; however, inadvertent computerized transcription errors may be present.   Any transcriptional errors that result from this process are unintentional.              Alexandria [name]  01/17/1946 [id number]  Patient Care Team: Carollee Herter, Alferd Apa, DO as PCP - General (Family Medicine) Berniece Salines, DO as PCP - Cardiology (Cardiology) Maisie Fus, MD (Inactive) as Consulting Physician (Obstetrics and Gynecology) Berniece Salines, DO as Consulting Physician (Cardiology) Sharyn Creamer, MD as Consulting Physician (Gastroenterology)  Discussed with my colleague, Dr. [name].  Reviewed films with Dr. Tery Sanfilippo with radiology.  Story of worsening constipation with known sigmoid kinking/stricture for some time.  Sounds like it is progressed over the past year.  Has seen different gastroenterologist.  Seeing it through [facility] but more recently established with [facility] gastroenterology.  Claims she had a UTI on antibiotics few weeks ago.  Colonoscopy done in [place] endoscopies x 2 showing sigmoid/left colon inflammation but no definite malignancy.  Last sigmoidoscopy earlier this month showing possible mass but biopsies again benign.  Patient with worsening bloating and discomfort and pain.    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  Reviewed the MRI of the pelvis.  There is thickening swirling in the rectosigmoid colon but not in the mid or distal rectum.  There is no adenopathy.  There is no evidence of any invasion.  I am skeptical that there truly is malignancy there.  Dr. Tery Sanfilippo was going to review in detail but certainly do not see any lymphadenopathy or anything concerning for T3/T4 lesion.  She does have leukocytosis and pain.  I think she would  benefit from repeat CAT scan well contrasted with oral and IV contrast.  No both are views, there is a possible abscess in the presacral mesenteric region.  See if there is anything there that warrants drainage or at least antibiotics.  See if she truly has colon obstruction as she seemed to have hints of partially on 12/15 CT scan 9 days ago.  At some point, she would benefit from rectosigmoid resection.  If she is truly obstructed or we cannot turn this around with antibiotics and bowel rest, she may require Hartmann resection this hospitalization.  She would like to avoid that.  However I cautioned it would not be safe to do urgent colon surgery with immediate anastomosis.  If we can avoid complete colonic obstruction with antibiotics and MiraLAX bowel regimen, perhaps we can limp this long and schedule a minimally invasive robotic rectosigmoid resection with anastomosis in the near future once infection is under better control and nutrition is better..  I did caution the patient she may need 2-3 operations to get through this with a temporary colostomy.  She does not like the idea of that, but she understands how serious this is and that she is having worsening symptoms.  Patient does have a diagnosis of pulmonary fibrosis but claims good exercise tolerance not on oxygen.  Distant history of a TIA but not on any blood thinners.  Started piperacillin/tazobactam for more aggressive coverage on the presumption this is more end-stage diverticulitis with worsening stricturing/obstruction.  D/w Dr. [name].  Ordered repeat CAT scan.  See if we can get well contrasted oral IV scan to help  sort this out.  See if there is a worsening fluid collection/abscess that could be controlled with drainage and/or antibiotics    Patient Active Problem List   Diagnosis Date Noted   Allergic reaction caused by a drug 03/29/2022   Rectal mass 03/13/2022   Flank pain 03/13/2022   Lower abdominal pain 03/13/2022    Abnormal urine findings 03/13/2022   Urinary frequency 01/02/2022   Light headed 01/02/2022   Flat foot 11/19/2021   Pain of right orbit 11/15/2021   S/P total knee arthroplasty, left 11/13/2021   Ankle impingement syndrome, left 11/13/2021   Rectal bleed 08/22/2021   Abnormal thyroid blood test 08/22/2021   Abnormal kidney function 08/22/2021   Bronchitis 07/18/2021   Dumping syndrome 03/13/2020   Hyperglycemia 03/13/2020   Arthritis    Bronchial pneumonia    Colon polyp    DVT (deep venous thrombosis) (HCC)    Heart murmur    Migraines    Pancreatitis    Thyroid disease    Aortic root dilatation ([place]) 11/24/2019   Leukocytosis 11/06/2019   Diarrhea 11/06/2019   Generalized abdominal pain 11/06/2019   Pre-ulcerative corn or callous 09/07/2019   Nausea 09/07/2019   Pain of left calf 05/09/2019   Tibial pain 05/09/2019   Essential hypertension 02/08/2019   Healthcare maintenance 11/10/2018   Bradycardia 07/22/2018   Pansinusitis 06/23/2018   History of transient ischemic attack (TIA) 03/24/2018   IPF (idiopathic pulmonary fibrosis) ([place]) 03/24/2018   Allergic rhinitis 02/16/2018   Eustachian tube dysfunction, bilateral 02/16/2018   URI (upper respiratory infection) 02/16/2018   Seasonal allergic rhinitis due to pollen 02/16/2018   ILD (interstitial lung disease) ([place]) 01/07/2018   Dyspnea 01/07/2018   Acute respiratory failure ([place]) 01/06/2018   TIA (transient ischemic attack) 01/06/2018   Chronic hypokalemia 12/15/2017   Chronic cough 11/26/2017   Basal cell carcinoma (BCC) of neck 06/07/2017   Anisometropia 06/02/2017   [place] of macula of both eyes 06/02/2017   Photopsia of left eye 06/02/2017   Cardiac murmur 16/01/9603   Diastolic dysfunction 54/12/8117   Situational anxiety 05/07/2017   Headache 05/07/2017   Mitral and aortic insufficiency 05/07/2017   Cellulitis of left lower extremity 04/30/2017   Migraine without status migrainosus, not intractable  06/09/2016   Insomnia 11/18/2015   Normal coronary arteries 05/20/2015   Chest pain at rest 05/20/2015   RBBB 05/20/2015   Abnormal CXR 05/20/2015   Abnormal CT of the chest 05/20/2015   Hx of myocardial infarction 05/08/2015   Bruising 05/08/2015   Upper airway cough syndrome 02/14/2015   Asthmatic bronchitis with acute exacerbation 02/06/2015   Mild intermittent asthma without complication 14/78/2956   Oliguria 12/28/2014   Hypothyroidism 10/25/2014   Fatigue 10/25/2014   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   Chronic venous insufficiency 10/24/2014   Varicose veins of leg with complications 21/30/8657   Degeneration of intervertebral disc of cervical region 06/29/2014   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   Generalized osteoarthritis of multiple sites 06/29/2014   History of stroke without residual deficits 06/29/2014   Palpitations 11/08/2013   SOB (shortness of breath) 10/03/2013   DOE (dyspnea on exertion) 10/03/2013   Edema of both legs 10/03/2013   Edema of lower extremity 10/03/2013   Subacute maxillary sinusitis 06/17/2013   Hair loss 11/25/2012   Alopecia 11/25/2012   Diverticulitis 07/14/2012   Diverticulitis of intestine 07/14/2012   Hammer toe 06/13/2012   Recurrent abdominal pain 04/17/2012   Abdominal pain 01/06/2012   Hypoglycemia 01/06/2012  History of stress test 05/21/2011   Hx of echocardiogram 01/27/2010   H/O cardiac catheterization 2004    Past Medical History:  Diagnosis Date   Arthritis    Asthmatic bronchitis with acute exacerbation 02/06/2015   Bronchial pneumonia    Colon polyp    Diverticulitis    DVT (deep venous thrombosis) (HCC)    lower extremity   H/O cardiac catheterization 2004   Normal coronary arteries   Heart murmur    History of stress test 05/21/2011   Hx of echocardiogram 01/27/2010   Normal Ef 55% the transmitral spectral doppler flow pattern is normal for age. the left ventricular wall motion is  normal   IPF (idiopathic pulmonary fibrosis) ([place]) 01/2018   Migraines    Pancreatitis    Thyroid disease    TIA (transient ischemic attack)    8 years ago    Past Surgical History:  Procedure Laterality Date   BREAST BIOPSY     [place]   CARDIAC CATHETERIZATION     10/2013   CATARACT EXTRACTION Right 03/22/2018   LEFT HEART CATHETERIZATION WITH CORONARY ANGIOGRAM N/A 10/25/2013   Procedure: LEFT HEART CATHETERIZATION WITH CORONARY ANGIOGRAM;  Surgeon: Troy Sine, MD;  [facility] C[facility] LAB;  Service: Cardiovascular;  Laterality: N/A;   NECK SURGERY     x's 2   TOTAL KNEE ARTHROPLASTY     Bilateral x's 2   VAGINAL HYSTERECTOMY  10/17/1998   [name]   VIDEO BRONCHOSCOPY Bilateral 01/24/2018   Procedure: VIDEO BRONCHOSCOPY WITH FLUORO;  Surgeon: [name], MD;  Location: [facility];  Service: Cardiopulmonary;  Laterality: Bilateral;    Social History   Socioeconomic History   Marital status: Married    Spouse name: [name]   Number of children: 3   Years of education: Jr college   Highest education level: Not on file  Occupational History   Occupation: Tour manager: UNEMPLOYED   Occupation: retired  Tobacco Use   Smoking status: Never   Smokeless tobacco: Never  Scientific laboratory technician Use: Never used  Substance and Sexual Activity   Alcohol use: No    Alcohol/week: 0.0 standard drinks of alcohol   Drug use: No   Sexual activity: Not on file  Other Topics Concern   Not on file  Social History Narrative   Lives with husband [name]   Caffeine use: coffee (2 cups per day)   Mostly right-handed   Social Determinants of Radio broadcast assistant Strain: Not on file  Food Insecurity: Not on file  Transportation Needs: Not on file  Physical Activity: Not on file  Stress: Not on file  Social Connections: Not on file  Intimate Partner Violence: Not on file    Family History  Problem Relation Age of Onset   Ovarian cancer Mother    Uterine  cancer Mother    Lung cancer Father    HIV Brother        1982   Kidney cancer Brother    Lung cancer Brother    Other Brother        Mouth Cancer   Colon cancer Maternal Aunt    Heart disease Maternal Grandmother    Stroke Maternal Grandmother    Hypertension Maternal Grandmother    Diabetes Maternal Grandmother    Arthritis Other    Esophageal cancer Neg Hx    Liver disease Neg Hx    Rectal cancer Neg Hx    Stomach  cancer Neg Hx     Current Facility-Administered Medications  Medication Dose Route Frequency Provider Last Rate Last Admin   0.45 % sodium chloride infusion   Intravenous Continuous Norins, Heinz Knuckles, MD 50 mL/hr at 03/29/22 0432 New Bag at 03/29/22 0432   aspirin EC tablet 81 mg  81 mg Oral Daily Norins, Heinz Knuckles, MD   81 mg at 03/29/22 0944   diphenhydrAMINE (BENADRYL) capsule 25 mg  25 mg Oral Q6H PRN Neena Rhymes, MD   25 mg at 03/29/22 0436   enoxaparin (LOVENOX) injection 40 mg  40 mg Subcutaneous Q24H Norins, Heinz Knuckles, MD   40 mg at 03/29/22 0945   famotidine (PEPCID) tablet 10 mg  10 mg Oral BID PRN Norins, Heinz Knuckles, MD       furosemide (LASIX) tablet 40 mg  40 mg Oral BID Neena Rhymes, MD   40 mg at 03/29/22 0748   iohexol (OMNIPAQUE) 300 MG/ML solution 100 mL  100 mL Intravenous Once PRN Lafonda Mosses, MD       ketorolac (TORADOL) 15 MG/ML injection 15 mg  15 mg Intravenous Q6H PRN Norins, Heinz Knuckles, MD       lactulose ([place]) 10 GM/15ML solution 20 g  20 g Oral BID Dessa Phi, DO       levothyroxine (SYNTHROID) tablet 112 mcg  112 mcg Oral QAC breakfast Neena Rhymes, MD   112 mcg at 03/29/22 0550   lidocaine (XYLOCAINE) 5 % ointment   Topical TID PRN Clovis Riley, MD       Nintedanib (Ofev) CAPS 100 mg  100 mg Oral Q12H Norins, Heinz Knuckles, MD       nitroGLYCERIN (NITROGLYN) 2 % ointment 0.5 inch  0.5 inch Topical Q6H Romana Juniper A, MD   0.5 inch at 03/29/22 0945   ondansetron (ZOFRAN-ODT) disintegrating tablet 4 mg  4  mg Oral Q8H PRN Norins, Heinz Knuckles, MD       piperacillin-tazobactam (ZOSYN) IVPB 3.375 g  3.375 g Intravenous Tor Netters, MD 12.5 mL/hr at 03/29/22 0950 3.375 g at 03/29/22 0950   potassium chloride SA (KLOR-CON M) CR tablet 20 mEq  20 mEq Oral BID Neena Rhymes, MD   20 mEq at 03/29/22 0944   traMADol (ULTRAM) tablet 50 mg  50 mg Oral Q8H PRN Norins, Heinz Knuckles, MD         Allergies  Allergen Reactions   Prednisone Itching and Other (See Comments)    Changed personality    Dilaudid [Hydromorphone] Itching and Other (See Comments)    Dilaudid caused marked confusion   Atrovent Hfa [Ipratropium Bromide Hfa] Itching and Other (See Comments)    Pt could not sleep   Cheratussin Ac [Guaifenesin-Codeine] Other (See Comments)    Headaches    Diltiazem Itching and Other (See Comments)    Makes patient sick. Shakes. GI   Hydrocodone Itching   Influenza Vaccines Other (See Comments)    Pt reports heart attack after last flu shot   Morphine Itching   Motrin [Ibuprofen] Itching and Other (See Comments)    "Gives false reading in blood"   Oxycodone-Acetaminophen Itching   Codeine Itching, Nausea Only and Other (See Comments)    Pt takes promethazine with codeine at home    BP (!) 125/59 (BP Location: Left Arm)   Pulse 78   Temp (!) 97.4 F (36.3 C) (Oral)   Resp 18   Ht '5\' 4"'$  (1.626 m)  Wt 63 kg   SpO2 94%   BMI 23.84 kg/m   CT ABDOMEN PELVIS W CONTRAST  Result Date: 03/20/2022 CLINICAL DATA:  Recent syncopal episode while on the toilet with sharp abdominal pain, initial encounter EXAM: CT ABDOMEN AND PELVIS WITH CONTRAST TECHNIQUE: Multidetector CT imaging of the abdomen and pelvis was performed using the standard protocol following bolus administration of intravenous contrast. RADIATION DOSE REDUCTION: This exam was performed according to the departmental dose-optimization program which includes automated exposure control, adjustment of the mA and/or kV according to patient  size and/or use of iterative reconstruction technique. CONTRAST:  175m OMNIPAQUE IOHEXOL 300 MG/ML  SOLN COMPARISON:  03/13/2022 FINDINGS: Lower chest: Subpleural fibrosis is noted in the bases bilaterally stable from. Hepatobiliary: Gallbladder is well distended. No evidence of cholelithiasis. The liver is within normal limits. Pancreas: Pancreas is atrophic. Spleen: Normal in size without focal abnormality. Adrenals/Urinary Tract: Adrenal glands are within normal limits. Kidneys demonstrate a normal enhancement pattern bilaterally. No renal calculi are seen. Normal excretion is noted on delayed images. Bladder is within normal limits. Stomach/Bowel: There is again noted an enhancing mass lesion in the rectum near the the sigmoid junction. Diverticular change the colon is noted. Fluid is noted throughout the colon consistent with the recent colonoscopy prep. The appendix is within normal limits. Small bowel and stomach are unremarkable. Vascular/Lymphatic: Aortic atherosclerosis. Scattered small lymph nodes are noted adjacent to the rectal mass in the pelvis. These measure less than 1 cm in dimension. Reproductive: Status post hysterectomy. No adnexal masses. Other: No free fluid is noted. Musculoskeletal: Degenerative changes of lumbar spine are noted. IMPRESSION: Enhancing mass lesion just below the rectosigmoid junction which corresponds with that seen on prior CT examination. Considerable fluid is noted throughout the colon consistent with the recent colonoscopy prep Diverticulosis without diverticulitis. No other focal abnormality is noted. Electronically Signed   By: MInez CatalinaM.D.   On: 03/20/2022 01:04   CT ABDOMEN PELVIS WO CONTRAST  Result Date: 03/13/2022 CLINICAL DATA:  Abdominal/flank pain. EXAM: CT ABDOMEN AND PELVIS WITHOUT CONTRAST TECHNIQUE: Multidetector CT imaging of the abdomen and pelvis was performed following the standard protocol without IV contrast. RADIATION DOSE REDUCTION: This  exam was performed according to the departmental dose-optimization program which includes automated exposure control, adjustment of the mA and/or kV according to patient size and/or use of iterative reconstruction technique. COMPARISON:  11/08/2019 FINDINGS: Lower chest: None the heart size appears mildly enlarged. There are small bilateral pleural effusions noted. Diffuse bilateral peripheral predominant interstitial reticulation identified, likely reflecting chronic interstitial lung disease. Hepatobiliary: No focal liver abnormality is seen. No gallstones, gallbladder wall thickening, or biliary dilatation. Pancreas: Unremarkable. No pancreatic ductal dilatation or surrounding inflammatory changes. Spleen: Normal in size without focal abnormality. Adrenals/Urinary Tract: Normal appearance of the adrenal glands. No nephrolithiasis, hydronephrosis or kidney mass. Urinary bladder appears normal for degree of distension. Stomach/Bowel: Stomach appears within normal limits. The appendix is visualized and appears normal. Sigmoid diverticulosis without signs of acute diverticulitis. There is a mass involving the proximal rectum with diffuse circumferential wall thickening and luminal narrowing. Surrounding soft tissue infiltration into the perirectal fat is identified, image 31/5. This measures 3.7 by 3.4 by 6.2 cm and is concerning for underlying colonic neoplasm. Perirectal adenopathy is identified. The largest lymph node is along the left lateral wall of the rectum measuring 1.6 cm, image 53/2. Vascular/Lymphatic: Aortic atherosclerosis without aneurysm. Aortocaval lymph node is prominent measuring 0.9 cm, image 33/2. Reproductive: Status post hysterectomy. No  adnexal masses. Other: There is presacral soft tissue stranding and soft tissue stranding surrounding the rectal mass. No discrete fluid collections. No signs of pneumoperitoneum. Musculoskeletal: No acute or significant osseous findings. IMPRESSION: 1. There  is a mass involving the proximal rectum with diffuse circumferential wall thickening and luminal narrowing. Surrounding soft tissue infiltration into the perirectal fat is identified. Perirectal adenopathy is identified. 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 Further evaluation with direct visualization and tissue sampling is advised. 2. No signs of bowel obstruction at this time. 3. Sigmoid diverticulosis without signs of acute diverticulitis. 4. No signs of nephrolithiasis or obstructive uropathy. 5. Small bilateral pleural effusions. 6. Signs of chronic interstitial lung disease are again noted. 7.  Aortic Atherosclerosis (ICD10-I70.0). Electronically Signed   By: Kerby Moors M.D.   On: 03/13/2022 16:09

## 2022-03-29 NOTE — Assessment & Plan Note (Addendum)
Longstanding known history, follows with Dr. [name] at [facility] No active symptoms Outpatient follow up

## 2022-03-29 NOTE — Assessment & Plan Note (Signed)
Patient with chronic constipation. Concern about having an obstructing mass. CT revealed enhancing lesion at sigmoid-rectal junction; Flex sig revealed polypoid mass with biopsy revealing inflammatory polyp w/o dysplasia or malignancy. MRI abd/pelvis 03/27/22 results pending. Does not appear obstructed on physicial exam.  Plan Await MRI report.   GS to see  Will start lactulose once a day for constipation  Post-discharge f/u GI and PCP

## 2022-03-29 NOTE — Assessment & Plan Note (Addendum)
Relaxed blood pressure targets due to advanced age As needed intravenous antihypertensives for markedly elevated blood pressure.

## 2022-03-29 NOTE — ED Notes (Signed)
PT c/o 11/10 pain--reached out to MD Karle Starch for pain meds. Pt received dilaudid '1mg'$  prior to leaving with [facility]. Pt states she feels much better after the medication.

## 2022-03-29 NOTE — Progress Notes (Signed)
Note: Portions of this report may have been transcribed using voice recognition software. Every effort was made to ensure accuracy; however, inadvertent computerized transcription errors may be present.   Any transcriptional errors that result from this process are unintentional.              Alexandria [name]  07-31-1945 [id number]  Patient Care Team: Carollee Herter, Alferd Apa, DO as PCP - General (Family Medicine) Berniece Salines, DO as PCP - Cardiology (Cardiology) Maisie Fus, MD (Inactive) as Consulting Physician (Obstetrics and Gynecology) Berniece Salines, DO as Consulting Physician (Cardiology) Sharyn Creamer, MD as Consulting Physician (Gastroenterology)    CT scan today c/w pelvic abscess - again I would favor complex diverticulitis Continue IV ABx Pt would benefit from drainage - most likely will need transgluteal approach.  IR consult request made - most likely tomorrow  Patient Active Problem List   Diagnosis Date Noted   Allergic reaction caused by a drug 03/29/2022   Rectal mass 03/13/2022   Flank pain 03/13/2022   Lower abdominal pain 03/13/2022   Abnormal urine findings 03/13/2022   Urinary frequency 01/02/2022   Light headed 01/02/2022   Flat foot 11/19/2021   Pain of right orbit 11/15/2021   S/P total knee arthroplasty, left 11/13/2021   Ankle impingement syndrome, left 11/13/2021   Rectal bleed 08/22/2021   Abnormal thyroid blood test 08/22/2021   Abnormal kidney function 08/22/2021   Bronchitis 07/18/2021   Dumping syndrome 03/13/2020   Hyperglycemia 03/13/2020   Arthritis    Bronchial pneumonia    Colon polyp    DVT (deep venous thrombosis) (HCC)    Heart murmur    Migraines    Pancreatitis    Thyroid disease    Aortic root dilatation ([place]) 11/24/2019   Leukocytosis 11/06/2019   Diarrhea 11/06/2019   Generalized abdominal pain 11/06/2019   Pre-ulcerative corn or callous 09/07/2019   Nausea 09/07/2019   Pain of left calf 05/09/2019   Tibial  pain 05/09/2019   Essential hypertension 02/08/2019   Healthcare maintenance 11/10/2018   Bradycardia 07/22/2018   Pansinusitis 06/23/2018   History of transient ischemic attack (TIA) 03/24/2018   IPF (idiopathic pulmonary fibrosis) ([place]) 03/24/2018   Allergic rhinitis 02/16/2018   Eustachian tube dysfunction, bilateral 02/16/2018   URI (upper respiratory infection) 02/16/2018   Seasonal allergic rhinitis due to pollen 02/16/2018   ILD (interstitial lung disease) ([place]) 01/07/2018   Dyspnea 01/07/2018   Acute respiratory failure ([place]) 01/06/2018   TIA (transient ischemic attack) 01/06/2018   Chronic hypokalemia 12/15/2017   Chronic cough 11/26/2017   Basal cell carcinoma (BCC) of neck 06/07/2017   Anisometropia 06/02/2017   [place] of macula of both eyes 06/02/2017   Photopsia of left eye 06/02/2017   Cardiac murmur 70/17/7939   Diastolic dysfunction 03/00/9233   Situational anxiety 05/07/2017   Headache 05/07/2017   Mitral and aortic insufficiency 05/07/2017   Cellulitis of left lower extremity 04/30/2017   Migraine without status migrainosus, not intractable 06/09/2016   Insomnia 11/18/2015   Normal coronary arteries 05/20/2015   Chest pain at rest 05/20/2015   RBBB 05/20/2015   Abnormal CXR 05/20/2015   Abnormal CT of the chest 05/20/2015   Hx of myocardial infarction 05/08/2015   Bruising 05/08/2015   Upper airway cough syndrome 02/14/2015   Asthmatic bronchitis with acute exacerbation 02/06/2015   Mild intermittent asthma without complication 00/76/2263   Oliguria 12/28/2014   Hypothyroidism 10/25/2014   Fatigue 10/25/2014   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  Chronic venous insufficiency 10/24/2014   Varicose veins of leg with complications 40/34/7425   Degeneration of intervertebral disc of cervical region 06/29/2014   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   Generalized osteoarthritis of multiple sites 06/29/2014   History of stroke without  residual deficits 06/29/2014   Palpitations 11/08/2013   SOB (shortness of breath) 10/03/2013   DOE (dyspnea on exertion) 10/03/2013   Edema of both legs 10/03/2013   Edema of lower extremity 10/03/2013   Subacute maxillary sinusitis 06/17/2013   Hair loss 11/25/2012   Alopecia 11/25/2012   Diverticulitis 07/14/2012   Diverticulitis of intestine 07/14/2012   Hammer toe 06/13/2012   Recurrent abdominal pain 04/17/2012   Abdominal pain 01/06/2012   Hypoglycemia 01/06/2012   History of stress test 05/21/2011   Hx of echocardiogram 01/27/2010   H/O cardiac catheterization 2004    Past Medical History:  Diagnosis Date   Arthritis    Asthmatic bronchitis with acute exacerbation 02/06/2015   Bronchial pneumonia    Colon polyp    Diverticulitis    DVT (deep venous thrombosis) (HCC)    lower extremity   H/O cardiac catheterization 2004   Normal coronary arteries   Heart murmur    History of stress test 05/21/2011   Hx of echocardiogram 01/27/2010   Normal Ef 55% the transmitral spectral doppler flow pattern is normal for age. the left ventricular wall motion is normal   IPF (idiopathic pulmonary fibrosis) ([place]) 01/2018   Migraines    Pancreatitis    Thyroid disease    TIA (transient ischemic attack)    8 years ago    Past Surgical History:  Procedure Laterality Date   BREAST BIOPSY     [place]   CARDIAC CATHETERIZATION     10/2013   CATARACT EXTRACTION Right 03/22/2018   LEFT HEART CATHETERIZATION WITH CORONARY ANGIOGRAM N/A 10/25/2013   Procedure: LEFT HEART CATHETERIZATION WITH CORONARY ANGIOGRAM;  Surgeon: Troy Sine, MD;  Location: [facility] CATH LAB;  Service: Cardiovascular;  Laterality: N/A;   NECK SURGERY     x's 2   TOTAL KNEE ARTHROPLASTY     Bilateral x's 2   VAGINAL HYSTERECTOMY  10/17/1998   [name]   VIDEO BRONCHOSCOPY Bilateral 01/24/2018   Procedure: VIDEO BRONCHOSCOPY WITH FLUORO;  Surgeon: [name], MD;  Location: [facility];  Service:  Cardiopulmonary;  Laterality: Bilateral;    Social History   Socioeconomic History   Marital status: Married    Spouse name: [name]   Number of children: 3   Years of education: Jr college   Highest education level: Not on file  Occupational History   Occupation: Tour manager: UNEMPLOYED   Occupation: retired  Tobacco Use   Smoking status: Never   Smokeless tobacco: Never  Scientific laboratory technician Use: Never used  Substance and Sexual Activity   Alcohol use: No    Alcohol/week: 0.0 standard drinks of alcohol   Drug use: No   Sexual activity: Not on file  Other Topics Concern   Not on file  Social History Narrative   Lives with husband [name]   Caffeine use: coffee (2 cups per day)   Mostly right-handed   Social Determinants of Health   Financial Resource Strain: Not on file  Food Insecurity: Not on file  Transportation Needs: Not on file  Physical Activity: Not on file  Stress: Not on file  Social Connections: Not on file  Intimate Partner Violence: Not on file  Family History  Problem Relation Age of Onset   Ovarian cancer Mother    Uterine cancer Mother    Lung cancer Father    HIV Brother        28   Kidney cancer Brother    Lung cancer Brother    Other Brother        Mouth Cancer   Colon cancer Maternal Aunt    Heart disease Maternal Grandmother    Stroke Maternal Grandmother    Hypertension Maternal Grandmother    Diabetes Maternal Grandmother    Arthritis Other    Esophageal cancer Neg Hx    Liver disease Neg Hx    Rectal cancer Neg Hx    Stomach cancer Neg Hx     Current Facility-Administered Medications  Medication Dose Route Frequency Provider Last Rate Last Admin   0.45 % sodium chloride infusion   Intravenous Continuous Norins, Heinz Knuckles, MD 50 mL/hr at 03/29/22 0432 New Bag at 03/29/22 0432   aspirin EC tablet 81 mg  81 mg Oral Daily Norins, Heinz Knuckles, MD   81 mg at 03/29/22 0944   diphenhydrAMINE (BENADRYL) capsule 25 mg  25 mg  Oral Q6H PRN Neena Rhymes, MD   25 mg at 03/29/22 0436   enoxaparin (LOVENOX) injection 40 mg  40 mg Subcutaneous Q24H Norins, Heinz Knuckles, MD   40 mg at 03/29/22 0945   famotidine (PEPCID) tablet 10 mg  10 mg Oral BID PRN Norins, Heinz Knuckles, MD       furosemide (LASIX) tablet 40 mg  40 mg Oral BID Neena Rhymes, MD   40 mg at 03/29/22 0748   ketorolac (TORADOL) 15 MG/ML injection 15 mg  15 mg Intravenous Q6H PRN Norins, Heinz Knuckles, MD       lactulose (CHRONULAC) 10 GM/15ML solution 20 g  20 g Oral BID Dessa Phi, DO       levothyroxine (SYNTHROID) tablet 112 mcg  112 mcg Oral QAC breakfast Neena Rhymes, MD   112 mcg at 03/29/22 0550   lidocaine (XYLOCAINE) 5 % ointment   Topical TID PRN Clovis Riley, MD       Nintedanib (Ofev) CAPS 100 mg  100 mg Oral Q12H Norins, Heinz Knuckles, MD       nitroGLYCERIN (NITROGLYN) 2 % ointment 0.5 inch  0.5 inch Topical Q6H Romana Juniper A, MD   0.5 inch at 03/29/22 1227   ondansetron (ZOFRAN-ODT) disintegrating tablet 4 mg  4 mg Oral Q8H PRN Norins, Heinz Knuckles, MD       piperacillin-tazobactam (ZOSYN) IVPB 3.375 g  3.375 g Intravenous Tor Netters, MD 12.5 mL/hr at 03/29/22 0950 3.375 g at 03/29/22 0950   potassium chloride SA (KLOR-CON M) CR tablet 20 mEq  20 mEq Oral BID Neena Rhymes, MD   20 mEq at 03/29/22 0944   traMADol (ULTRAM) tablet 50 mg  50 mg Oral Q8H PRN Norins, Heinz Knuckles, MD         Allergies  Allergen Reactions   Prednisone Itching and Other (See Comments)    Changed personality    Dilaudid [Hydromorphone] Itching and Other (See Comments)    Dilaudid caused marked confusion   Atrovent Hfa [Ipratropium Bromide Hfa] Itching and Other (See Comments)    Pt could not sleep   Cheratussin Ac [Guaifenesin-Codeine] Other (See Comments)    Headaches    Diltiazem Itching and Other (See Comments)    Makes patient sick. Shakes. GI  Hydrocodone Itching   Influenza Vaccines Other (See Comments)    Pt reports heart attack  after last flu shot   Morphine Itching   Motrin [Ibuprofen] Itching and Other (See Comments)    "Gives false reading in blood"   Oxycodone-Acetaminophen Itching   Codeine Itching, Nausea Only and Other (See Comments)    Pt takes promethazine with codeine at home    BP (!) 125/59 (BP Location: Left Arm)   Pulse 78   Temp (!) 97.4 F (36.3 C) (Oral)   Resp 18   Ht '5\' 4"'$  (1.626 m)   Wt 63 kg   SpO2 94%   BMI 23.84 kg/m   CT ABDOMEN PELVIS W CONTRAST  Result Date: 03/29/2022 CLINICAL DATA:  Constipation. Clinical suspicion for diverticulitis. EXAM: CT ABDOMEN AND PELVIS WITH CONTRAST TECHNIQUE: Multidetector CT imaging of the abdomen and pelvis was performed using the standard protocol following bolus administration of intravenous contrast. RADIATION DOSE REDUCTION: This exam was performed according to the departmental dose-optimization program which includes automated exposure control, adjustment of the mA and/or kV according to patient size and/or use of iterative reconstruction technique. CONTRAST:  139m OMNIPAQUE IOHEXOL 300 MG/ML  SOLN COMPARISON:  03/20/2022.  03/13/2022. FINDINGS: Lower chest: Chronic fibrotic changes at the lung bases, stable. No acute findings. Hepatobiliary: No focal liver abnormality is seen. No gallstones, gallbladder wall thickening, or biliary dilatation. Pancreas: Pancreatic atrophy.  No mass.  No inflammation. Spleen: Normal in size without focal abnormality. Adrenals/Urinary Tract: Adrenal glands are unremarkable. Kidneys are normal, without renal calculi, focal lesion, or hydronephrosis. Bladder is unremarkable. Stomach/Bowel: Wall thickening of the distal sigmoid colon. There is adjacent inflammatory change. An irregular extraluminal fluid collection, containing non dependent air, tracks along the lower sigmoid colon to the level of the rectosigmoid junction, measuring approximately 6 cm from superior to inferior by 2.6 x 2.6 cm transversely. There are numerous  diverticula along the remainder of the sigmoid colon with no other areas of wall thickening or inflammation. Remainder of the colon is normal in caliber with no evidence of inflammation. Normal stomach and small bowel. Normal appendix. Vascular/Lymphatic: Aortic atherosclerosis. No aneurysm. No enlarged lymph nodes. Reproductive: Status post hysterectomy.  No pelvic/adnexal masses. Other: No hernia.  No ascites.  No free air. Musculoskeletal: No fracture or acute finding.  No bone lesion. IMPRESSION: 1. Current findings are consistent with complicated sigmoid diverticulitis. Distal sigmoid colon shows wall thickening and adjacent inflammation. There is an adjacent irregular fluid collection consistent with a peridiverticular abscess in the posteroinferior pelvis that measures 6 x 2.6 x 2.6 cm. Inflammatory changes have become more evident compared to the prior CTs. No bowel obstruction. 2. No other acute abnormality.  No free air. Electronically Signed   By: DLajean ManesM.D.   On: 03/29/2022 11:32   CT ABDOMEN PELVIS W CONTRAST  Result Date: 03/20/2022 CLINICAL DATA:  Recent syncopal episode while on the toilet with sharp abdominal pain, initial encounter EXAM: CT ABDOMEN AND PELVIS WITH CONTRAST TECHNIQUE: Multidetector CT imaging of the abdomen and pelvis was performed using the standard protocol following bolus administration of intravenous contrast. RADIATION DOSE REDUCTION: This exam was performed according to the departmental dose-optimization program which includes automated exposure control, adjustment of the mA and/or kV according to patient size and/or use of iterative reconstruction technique. CONTRAST:  1032mOMNIPAQUE IOHEXOL 300 MG/ML  SOLN COMPARISON:  03/13/2022 FINDINGS: Lower chest: Subpleural fibrosis is noted in the bases bilaterally stable from. Hepatobiliary: Gallbladder is well distended. No  evidence of cholelithiasis. The liver is within normal limits. Pancreas: Pancreas is atrophic.  Spleen: Normal in size without focal abnormality. Adrenals/Urinary Tract: Adrenal glands are within normal limits. Kidneys demonstrate a normal enhancement pattern bilaterally. No renal calculi are seen. Normal excretion is noted on delayed images. Bladder is within normal limits. Stomach/Bowel: There is again noted an enhancing mass lesion in the rectum near the the sigmoid junction. Diverticular change the colon is noted. Fluid is noted throughout the colon consistent with the recent colonoscopy prep. The appendix is within normal limits. Small bowel and stomach are unremarkable. Vascular/Lymphatic: Aortic atherosclerosis. Scattered small lymph nodes are noted adjacent to the rectal mass in the pelvis. These measure less than 1 cm in dimension. Reproductive: Status post hysterectomy. No adnexal masses. Other: No free fluid is noted. Musculoskeletal: Degenerative changes of lumbar spine are noted. IMPRESSION: Enhancing mass lesion just below the rectosigmoid junction which corresponds with that seen on prior CT examination. Considerable fluid is noted throughout the colon consistent with the recent colonoscopy prep Diverticulosis without diverticulitis. No other focal abnormality is noted. Electronically Signed   By: Inez Catalina M.D.   On: 03/20/2022 01:04   CT ABDOMEN PELVIS WO CONTRAST  Result Date: 03/13/2022 CLINICAL DATA:  Abdominal/flank pain. EXAM: CT ABDOMEN AND PELVIS WITHOUT CONTRAST TECHNIQUE: Multidetector CT imaging of the abdomen and pelvis was performed following the standard protocol without IV contrast. RADIATION DOSE REDUCTION: This exam was performed according to the departmental dose-optimization program which includes automated exposure control, adjustment of the mA and/or kV according to patient size and/or use of iterative reconstruction technique. COMPARISON:  11/08/2019 FINDINGS: Lower chest: None the heart size appears mildly enlarged. There are small bilateral pleural effusions  noted. Diffuse bilateral peripheral predominant interstitial reticulation identified, likely reflecting chronic interstitial lung disease. Hepatobiliary: No focal liver abnormality is seen. No gallstones, gallbladder wall thickening, or biliary dilatation. Pancreas: Unremarkable. No pancreatic ductal dilatation or surrounding inflammatory changes. Spleen: Normal in size without focal abnormality. Adrenals/Urinary Tract: Normal appearance of the adrenal glands. No nephrolithiasis, hydronephrosis or kidney mass. Urinary bladder appears normal for degree of distension. Stomach/Bowel: Stomach appears within normal limits. The appendix is visualized and appears normal. Sigmoid diverticulosis without signs of acute diverticulitis. There is a mass involving the proximal rectum with diffuse circumferential wall thickening and luminal narrowing. Surrounding soft tissue infiltration into the perirectal fat is identified, image 31/5. This measures 3.7 by 3.4 by 6.2 cm and is concerning for underlying colonic neoplasm. Perirectal adenopathy is identified. The largest lymph node is along the left lateral wall of the rectum measuring 1.6 cm, image 53/2. Vascular/Lymphatic: Aortic atherosclerosis without aneurysm. Aortocaval lymph node is prominent measuring 0.9 cm, image 33/2. Reproductive: Status post hysterectomy. No adnexal masses. Other: There is presacral soft tissue stranding and soft tissue stranding surrounding the rectal mass. No discrete fluid collections. No signs of pneumoperitoneum. Musculoskeletal: No acute or significant osseous findings. IMPRESSION: 1. There is a mass involving the proximal rectum with diffuse circumferential wall thickening and luminal narrowing. Surrounding soft tissue infiltration into the perirectal fat is identified. Perirectal adenopathy is identified. 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 Further evaluation with direct visualization and tissue sampling is advised. 2. No  signs of bowel obstruction at this time. 3. Sigmoid diverticulosis without signs of acute diverticulitis. 4. No signs of nephrolithiasis or obstructive uropathy. 5. Small bilateral pleural effusions. 6. Signs of chronic interstitial lung disease are again noted. 7.  Aortic Atherosclerosis (ICD10-I70.0). Electronically Signed   By:  Kerby Moors M.D.   On: 03/13/2022 16:09

## 2022-03-29 NOTE — H&P (Signed)
History and Physical    [name] GOT:[id number] DOB: 1945-08-08 DOA: 03/28/2022  DOS: the patient was seen and examined on 03/28/2022  PCP: [name], DO   Patient coming from: Home-transferred from [facility]  I have personally briefly reviewed patient's old medical records in [facility]  [name], a 76 y/o with a h/o hypothyroidism, grade I DD, HTN, IPF, diverticulosis, diverticulitis, DVT, chronic hypokalemia as well as other medical problems. She has a long h/o constipation and has recently tried Linzess but stopped after 2 weeks due to lack of efficacy by her report. She recently came to CT abd/pelvis 03/20/22 which reveals an enhancing lesion/mass just below the sigmoid-rectal junction. Flex Sig was performed 03/20/22 which revealed a polyupoid mass at the sigmoid-rectal junction. Bx was readout by pathology as an inflammatory poly w/o dysplasia or malignancy. Patient had MRI abd/pelvis 03/27/22 - reading pending. Due to continued constipation and diffuse abdominal pain, worst in the LUQ, she presented to [facility] for evaluation.    ED Course: T 97.5, 155/80  HR 77 RR 18. Did not appear to be in acute distress per ED-PA note. Lab: Glucose 116, Ca 8.6  WBC 13.4 w/ 72/9/19. She was given zofran, APAP, IVF. Due to her discomfort and tenderness on abdominal exam Dr. Kae Heller for general surgery was consulted who recommended medicine admit and GS see in consult. Patient was given both morphine and dilaudid for pain control which had little effect. Patient does report prfound pruritus and confusion.she has known sensitivity to hydrocodone. [facility] was called and accepted the patient in transfer to [facility] on observation.   Review of Systems:  Review of Systems  Constitutional:  Negative for chills, fever and weight loss.  HENT: Negative.    Eyes: Negative.   Respiratory:  Negative for cough, sputum production and shortness of breath.   Cardiovascular:  Negative for chest pain,  palpitations and leg swelling.  Gastrointestinal:  Positive for abdominal pain, constipation and nausea. Negative for blood in stool and melena.  Genitourinary: Negative.   Musculoskeletal:  Positive for back pain and joint pain.  Skin:  Positive for itching.  Neurological:        Confused state which she attributes to pain medication she received in ED  Endo/Heme/Allergies: Negative.   Psychiatric/Behavioral: Negative.      Past Medical History:  Diagnosis Date   Arthritis    Asthmatic bronchitis with acute exacerbation 02/06/2015   Bronchial pneumonia    Colon polyp    Diverticulitis    DVT (deep venous thrombosis) (HCC)    lower extremity   H/O cardiac catheterization 2004   Normal coronary arteries   Heart murmur    History of stress test 05/21/2011   Hx of echocardiogram 01/27/2010   Normal Ef 55% the transmitral spectral doppler flow pattern is normal for age. the left ventricular wall motion is normal   IPF (idiopathic pulmonary fibrosis) ([place]) 01/2018   Migraines    Pancreatitis    Thyroid disease    TIA (transient ischemic attack)    8 years ago    Past Surgical History:  Procedure Laterality Date   BREAST BIOPSY     [place]   CARDIAC CATHETERIZATION     10/2013   CATARACT EXTRACTION Right 03/22/2018   LEFT HEART CATHETERIZATION WITH CORONARY ANGIOGRAM N/A 10/25/2013   Procedure: LEFT HEART CATHETERIZATION WITH CORONARY ANGIOGRAM;  Surgeon: Troy Sine, MD;  Location: [facility] CATH LAB;  Service: Cardiovascular;  Laterality: N/A;  NECK SURGERY     x's 2   TOTAL KNEE ARTHROPLASTY     Bilateral x's 2   VAGINAL HYSTERECTOMY  10/17/1998   Ron Nori [name]   VIDEO BRONCHOSCOPY Bilateral 01/24/2018   Procedure: VIDEO BRONCHOSCOPY WITH FLUORO;  Surgeon: [name], MD;  Location: [place];  Service: Cardiopulmonary;  Laterality: Bilateral;    Soc Hx - married 57 years. She has 1 son, two daughters, two grandsons. Her husband is a Therapist, art with PTSD, hearing  loss and memory loss. She doesn't work - retired 25 years ago.    reports that she has never smoked. She has never used smokeless tobacco. She reports that she does not drink alcohol and does not use drugs.  Allergies  Allergen Reactions   Prednisone Other (See Comments)    Changed personality    Dilaudid [Hydromorphone] Itching and Other (See Comments)    Dilaudid caused marked confusion   Atrovent Hfa [Ipratropium Bromide Hfa] Other (See Comments)    Pt could not sleep   Cheratussin Ac [Guaifenesin-Codeine]    Codeine Other (See Comments)    Pt takes promethazine with codeine at home   Diltiazem     Other Reaction(s): Other (See Comments)  Makes patient sick. Shakes. GI   Hydrocodone Itching   Influenza Vaccines Other (See Comments)    Pt reports heart attack after last flu shot   Motrin [Ibuprofen] Other (See Comments)    "Gives false reading in blood"   Oxycodone-Acetaminophen Itching    Family History  Problem Relation Age of Onset   Ovarian cancer Mother    Uterine cancer Mother    Lung cancer Father    HIV Brother        80   Kidney cancer Brother    Lung cancer Brother    Other Brother        Mouth Cancer   Colon cancer Maternal Aunt    Heart disease Maternal Grandmother    Stroke Maternal Grandmother    Hypertension Maternal Grandmother    Diabetes Maternal Grandmother    Arthritis Other    Esophageal cancer Neg Hx    Liver disease Neg Hx    Rectal cancer Neg Hx    Stomach cancer Neg Hx     Prior to Admission medications   Medication Sig Start Date End Date Taking? Authorizing Provider  AMBULATORY NON FORMULARY MEDICATION Medication Name: Diltiazem 2% with Lidocaine 5%- use index finger, apply small amount of medication inside the rectum up to your first knuckle/joint for 8 weeks 03/20/22   Sharyn Creamer, MD  aspirin EC 81 MG tablet Take 81 mg by mouth daily. Swallow whole.    [provider]  benzonatate (TESSALON) 100 MG capsule Take 200 mg  by mouth 3 (three) times daily as needed. 03/06/22   [provider]  Cholecalciferol (VITAMIN D3) 125 MCG (5000 UT) CAPS Take 5,000 Units by mouth daily.    [provider]  furosemide (LASIX) 40 MG tablet TAKE 1 TABLET BY MOUTH TWICE A DAY 09/03/21   Lowne Chase, Yvonne R, DO  KLOR-CON M20 20 MEQ tablet Take 1 tablet (20 mEq total) by mouth 2 (two) times daily. Will take 3 a daily only as needed. 01/06/22   [name], DO  linaclotide (LINZESS) 145 MCG CAPS capsule Take 1 capsule (145 mcg total) by mouth daily before breakfast. 03/17/22   Sharyn Creamer, MD  Multiple Vitamin (MULTIVITAMIN) tablet Take 1 tablet by  mouth every other day. Gluten free/Vegetarian Multivitamin    [provider]  Nintedanib (OFEV) 100 MG CAPS Take by mouth. 07/03/20   [provider]  ondansetron (ZOFRAN-ODT) 4 MG disintegrating tablet TAKE 1 TABLET BY MOUTH EVERY 8 HOURS AS NEEDED FOR NAUSEA AND VOMITING 07/18/21   Carollee Herter, Alferd Apa, DO  SYNTHROID 112 MCG tablet Take 1 tablet (112 mcg total) by mouth daily before breakfast. 03/02/22   Carollee Herter, Alferd Apa, DO  traMADol (ULTRAM) 50 MG tablet Take 1 tablet (50 mg total) by mouth every 8 (eight) hours as needed for up to 5 days. 03/27/22 04/01/22  [name], DO  vitamin C (ASCORBIC ACID) 500 MG tablet Take 1,000 mg by mouth 2 (two) times daily.     [provider]  vitamin E 1000 UNIT capsule Take 1,000 Units by mouth daily.    [provider]    Physical Exam: Vitals:   03/29/22 0000 03/29/22 0015 03/29/22 0222 03/29/22 0451  BP: (!) 143/63 (!) 141/69 (!) 155/80 (!) 144/76  Pulse: 71 73 77 67  Resp: '16 18 18 14  '$ Temp:   (!) 97.5 F (36.4 C) (!) 97.5 F (36.4 C)  TempSrc:   Oral Oral  SpO2: 95% 94% 96% 96%  Weight:      Height:        Physical Exam Vitals and nursing note reviewed.  Constitutional:      General: She is not in acute distress.    Appearance: She is well-developed  and normal weight. She is not ill-appearing or toxic-appearing.  HENT:     Head: Normocephalic and atraumatic.     Mouth/Throat:     Mouth: Mucous membranes are moist.     Pharynx: No pharyngeal swelling.  Eyes:     General: No scleral icterus.    Extraocular Movements: Extraocular movements intact.     Pupils: Pupils are equal, round, and reactive to light.  Cardiovascular:     Rate and Rhythm: Normal rate and regular rhythm.     Heart sounds: Normal heart sounds.  Pulmonary:     Effort: Pulmonary effort is normal.     Breath sounds: Normal breath sounds.  Abdominal:     General: Abdomen is flat. Bowel sounds are decreased. There is no distension or abdominal bruit.     Palpations: Abdomen is soft. There is no hepatomegaly or mass.     Tenderness: There is generalized abdominal tenderness and tenderness in the left upper quadrant. There is no guarding or rebound.     Hernia: No hernia is present.     Comments: Hypoactive but present BS in all quadrants. No guarding or rebound.   Diffuse tenders with worst pain LUQ. No palpable spleen.   Skin:    General: Skin is warm and dry.  Neurological:     General: No focal deficit present.     Mental Status: She is alert and oriented to person, place, and time.  Psychiatric:        Mood and Affect: Mood normal.        Behavior: Behavior normal.      Labs on Admission: I have personally reviewed following labs and imaging studies  CBC: Recent Labs  Lab 03/28/22 1904  WBC 13.4*  NEUTROABS 9.7*  HGB 12.5  HCT 38.1  MCV 99.7  PLT 161   Basic Metabolic Panel: Recent Labs  Lab 03/28/22 1904  NA 132*  K 3.7  CL 95*  CO2 30  GLUCOSE 116*  BUN 13  CREATININE 0.84  CALCIUM 8.6*   GFR: Estimated Creatinine Clearance: 48.4 mL/min (by C-G formula based on SCr of 0.84 mg/dL). Liver Function Tests: Recent Labs  Lab 03/28/22 1904  AST 19  ALT 13  ALKPHOS 77  BILITOT 1.0  PROT 8.1  ALBUMIN 3.1*   Recent Labs  Lab  03/28/22 1904  LIPASE 25   No results for input(s): "AMMONIA" in the last 168 hours. Coagulation Profile: No results for input(s): "INR", "PROTIME" in the last 168 hours. Cardiac Enzymes: No results for input(s): "CKTOTAL", "CKMB", "CKMBINDEX", "TROPONINI" in the last 168 hours. BNP (last 3 results) No results for input(s): "PROBNP" in the last 8760 hours. HbA1C: No results for input(s): "HGBA1C" in the last 72 hours. CBG: No results for input(s): "GLUCAP" in the last 168 hours. Lipid Profile: No results for input(s): "CHOL", "HDL", "LDLCALC", "TRIG", "CHOLHDL", "LDLDIRECT" in the last 72 hours. Thyroid Function Tests: No results for input(s): "TSH", "T4TOTAL", "FREET4", "T3FREE", "THYROIDAB" in the last 72 hours. Anemia Panel: No results for input(s): "VITAMINB12", "FOLATE", "FERRITIN", "TIBC", "IRON", "RETICCTPCT" in the last 72 hours. Urine analysis:    Component Value Date/Time   COLORURINE YELLOW 11/13/2019 1358   APPEARANCEUR CLEAR 11/13/2019 1358   LABSPEC 1.015 11/13/2019 1358   PHURINE 6.0 11/13/2019 1358   GLUCOSEU NEGATIVE 11/13/2019 1358   HGBUR NEGATIVE 11/13/2019 1358   BILIRUBINUR small 03/13/2022 1507   KETONESUR NEGATIVE 11/13/2019 1358   PROTEINUR Negative 03/13/2022 1507   PROTEINUR NEGATIVE 11/13/2019 1358   UROBILINOGEN 0.2 03/13/2022 1507   NITRITE negative 03/13/2022 1507   NITRITE NEGATIVE 11/13/2019 1358   LEUKOCYTESUR Trace (A) 03/13/2022 1507   LEUKOCYTESUR NEGATIVE 11/13/2019 1358    Radiological Exams on Admission: I have personally reviewed images No results found.  EKG: I have personally reviewed EKG: sinus rhythm, RBBB, LAFB, no acute changes  Assessment/Plan Principal Problem:   Rectal mass Active Problems:   Abdominal pain   Chronic venous insufficiency   Hypothyroidism   Diastolic dysfunction   IPF (idiopathic pulmonary fibrosis) (HCC)   Essential hypertension   Allergic reaction caused by a drug    Assessment and Plan: *  Rectal mass Enhancing mass seen on CT. Flex sig reveals polypoid mass with benign biopsy. Rectum was not obstructed. MRI done 03/27/22 with read pending. On exam - hypoactive BS. Obstruction unlikely.  Plan Per MRI results  GS to see - aGI vs surgical polypectomy if indicated.   Abdominal pain Patient with chronic constipation. Concern about having an obstructing mass. CT revealed enhancing lesion at sigmoid-rectal junction; Flex sig revealed polypoid mass with biopsy revealing inflammatory polyp w/o dysplasia or malignancy. MRI abd/pelvis 03/27/22 results pending. Does not appear obstructed on physicial exam.  Plan Await MRI report.   GS to see  Will start lactulose once a day for constipation  Post-discharge f/u GI and PCP  Allergic reaction caused by a drug Patient reports severe itching after pain medication: MS vs dilaudid (both given in ED. )  Plan Benadryl 25 mg q6 prn  H2 blocker bid prn  Avoid dilaudid, MS  Essential hypertension BP mildly elevated. Only takes diuretic. Reports intolerance CCB.  Plan Will monitor BP  For persistently elevated BP will add BB or defer to PCP  IPF (idiopathic pulmonary fibrosis) (HCC) No respiratory distress on exam. No rales or wheezing.  Plan Continue home regimen  Diastolic dysfunction Last Echo March '22 EF 60-65%, grade I DD. Patient takes lasix  bid. Well compensated on exam.  Plan Continue home meds  Hypothyroidism Last TSH 9.29/23 was 0.9.  Plan Continue home regimen  Chronic venous insufficiency Stable. NO leg swelling or lesions       DVT prophylaxis: Lovenox Code Status: Full Code Family Communication: deferred - spouse is deaf and has mild cognitive issues  Disposition Plan: home 24-48 hrs  Consults called: General surgery - Dr. Kae Heller  Admission status: Observation, Med-Surg   Adella Hare, MD [facility] 03/29/2022, 4:51 AM

## 2022-03-29 NOTE — Progress Notes (Signed)
  Transition of Care [facility]) Screening Note   Patient Details  Name: GRAINNE KNIGHTS Date of Birth: [birth year]   [facility]) CM/SW Contact:    Henrietta Dine, RN Phone Number: 03/29/2022, 4:51 PM    Transition of Care Department [facility]) has reviewed patient and no TOC needs have been identified at this time. We will continue to monitor patient advancement through interdisciplinary progression rounds. If new patient transition needs arise, please place a TOC consult.

## 2022-03-29 NOTE — Progress Notes (Signed)
IR was requested for image guided drain placement.   Case was reviewed by Dr. Kathlene Cote. Case appears to be challenging, there might be a small window for left TG approach but the fluid collection may become not reachable when patient is in prone position.    Will re-review CT with Dr. Denna Haggard tomorrow AM.  Pt NPO at [facility], Mon AM Lovenox held, INR pending.   Please call IR for questions and concerns.   Armando Gang Jamison Yuhasz PA-C 03/29/2022 1:53 PM

## 2022-03-29 NOTE — Progress Notes (Signed)
Pt has arrived to room 1301, via stretcher, by [facility]. She is warm, dry, no visible distress.

## 2022-03-29 NOTE — Consult Note (Addendum)
Surgical Evaluation Requesting provider: Dr. Maylene Roes  Chief Complaint: abdominal pain  HPI: 76 year old woman with history of migraines, DVT, TIA, hypothyroidism, pulmonary fibrosis, pancreatitis and diverticulitis who presented to the [place] today with ongoing concerns for abdominal pain, bloating, anorexia and nausea with emesis most recently a few days ago, no bowel movement for a week.  She has been struggling with abdominal pain, constipation, rectal bleeding and rectal pain for actually several months at this point (sound like this started around May) and has undergone a fairly extensive workup including CT and colonoscopy over the summer with findings consistent with diverticular disease.    She re-presented a couple of weeks ago with decreased caliber stools, moderate hematochezia, and extreme pain with bowel movements.  Associated bloating, nausea and poor appetite.  Lower abdominal pain which is crampy in quality but there all the time.  She had an aunt that had colon cancer who presented in a similar fashion and passed away within a year.    No previous abdominal surgeries.  She is fairly active, she tries to walk about 2 miles every day.  Colonoscopy 10/31/21 [facility]): Moderate left sided diverticulosis was noted.  Redundant distal sigmoid with a segment showing erethyma and friability, several biopsies done.  Large Internal hemorrhoids were seen on rectal retroflection.   EUS scheduled for 04/30/22  MRI pelvis done 12/21, but not read.  CT 12/8:  IMPRESSION: 1. There is a mass involving the proximal rectum with diffuse circumferential wall thickening and luminal narrowing. Surrounding soft tissue infiltration into the perirectal fat is identified. Perirectal adenopathy is identified. 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 Further evaluation with direct visualization and tissue sampling is advised. 2. No signs of bowel obstruction at this time. 3.  Sigmoid diverticulosis without signs of acute diverticulitis. 4. No signs of nephrolithiasis or obstructive uropathy. 5. Small bilateral pleural effusions. 6. Signs of chronic interstitial lung disease are again noted. 7.  Aortic Atherosclerosis (ICD10-I70.0).  Flex sig 12/15 Dr. [name]: Findings: - An area of congested mucosa was found in the sigmoid colon, in the descending colon and at the splenic flexure. This was biopsied with a cold forceps for histology. - Multiple diverticula were found in the sigmoid colon and descending colon. - A polypoid partially obstructing large mass was found in the recto-sigmoid colon. The mass measured one cm in length. In addition, its diameter measured fourteen mm. No bleeding was present. Biopsies were taken with a cold forceps for histology. - Non-bleeding internal hemorrhoids were found during retroflexion. - An anal fissure was found on perianal exam.   Path: 1. Left Colon Biopsy - PATCHY MILD ACUTE COLITIS WITH DEGENERATIVE GLANDULAR CHANGES AND HYALINIZATION OF LAMINA PROPRIA, SEE NOTE 2. Rectum, biopsy - POLYPOID COLONIC MUCOSA SHOWING NONSPECIFIC ARCHITECTURAL AND INFLAMMATORY CHANGES,SUGGESTIVE OF AN INFLAMMATORY POLYP - NEGATIVE FOR DYSPLASIA OR MALIGNANCY IN THE SUBMITTED BIOPSIES    Allergies  Allergen Reactions   Prednisone Other (See Comments)    Changed personality    Dilaudid [Hydromorphone] Itching and Other (See Comments)    Dilaudid caused marked confusion   Atrovent Hfa [Ipratropium Bromide Hfa] Other (See Comments)    Pt could not sleep   Cheratussin Ac [Guaifenesin-Codeine]    Codeine Other (See Comments)    Pt takes promethazine with codeine at home   Diltiazem     Other Reaction(s): Other (See Comments)  Makes patient sick. Shakes. GI   Hydrocodone Itching   Influenza Vaccines Other (See Comments)  Pt reports heart attack after last flu shot   Motrin [Ibuprofen] Other (See Comments)    "Gives false reading in  blood"   Oxycodone-Acetaminophen Itching    Past Medical History:  Diagnosis Date   Arthritis    Asthmatic bronchitis with acute exacerbation 02/06/2015   Bronchial pneumonia    Colon polyp    Diverticulitis    DVT (deep venous thrombosis) (HCC)    lower extremity   H/O cardiac catheterization 2004   Normal coronary arteries   Heart murmur    History of stress test 05/21/2011   Hx of echocardiogram 01/27/2010   Normal Ef 55% the transmitral spectral doppler flow pattern is normal for age. the left ventricular wall motion is normal   IPF (idiopathic pulmonary fibrosis) ([place]) 01/2018   Migraines    Pancreatitis    Thyroid disease    TIA (transient ischemic attack)    8 years ago    Past Surgical History:  Procedure Laterality Date   BREAST BIOPSY     [place]   CARDIAC CATHETERIZATION     10/2013   CATARACT EXTRACTION Right 03/22/2018   LEFT HEART CATHETERIZATION WITH CORONARY ANGIOGRAM N/A 10/25/2013   Procedure: LEFT HEART CATHETERIZATION WITH CORONARY ANGIOGRAM;  Surgeon: Troy Sine, MD;  Location: [facility] CATH LAB;  Service: Cardiovascular;  Laterality: N/A;   NECK SURGERY     x's 2   TOTAL KNEE ARTHROPLASTY     Bilateral x's 2   VAGINAL HYSTERECTOMY  10/17/1998   [name]   VIDEO BRONCHOSCOPY Bilateral 01/24/2018   Procedure: VIDEO BRONCHOSCOPY WITH FLUORO;  Surgeon: [name], MD;  Location: [facility];  Service: Cardiopulmonary;  Laterality: Bilateral;    Family History  Problem Relation Age of Onset   Ovarian cancer Mother    Uterine cancer Mother    Lung cancer Father    HIV Brother        34   Kidney cancer Brother    Lung cancer Brother    Other Brother        Mouth Cancer   Colon cancer Maternal Aunt    Heart disease Maternal Grandmother    Stroke Maternal Grandmother    Hypertension Maternal Grandmother    Diabetes Maternal Grandmother    Arthritis Other    Esophageal cancer Neg Hx    Liver disease Neg Hx    Rectal cancer Neg Hx     Stomach cancer Neg Hx     Social History   Socioeconomic History   Marital status: Married    Spouse name: [name]   Number of children: 3   Years of education: Jr college   Highest education level: Not on file  Occupational History   Occupation: Tour manager: UNEMPLOYED   Occupation: retired  Tobacco Use   Smoking status: Never   Smokeless tobacco: Never  Scientific laboratory technician Use: Never used  Substance and Sexual Activity   Alcohol use: No    Alcohol/week: 0.0 standard drinks of alcohol   Drug use: No   Sexual activity: Not on file  Other Topics Concern   Not on file  Social History Narrative   Lives with husband [name]   Caffeine use: coffee (2 cups per day)   Mostly right-handed   Social Determinants of Health   Financial Resource Strain: Not on file  Food Insecurity: Not on file  Transportation Needs: Not on file  Physical Activity: Not on file  Stress: Not on  file  Social Connections: Not on file    No current facility-administered medications on file prior to encounter.   Current Outpatient Medications on File Prior to Encounter  Medication Sig Dispense Refill   AMBULATORY NON FORMULARY MEDICATION Medication Name: Diltiazem 2% with Lidocaine 5%- use index finger, apply small amount of medication inside the rectum up to your first knuckle/joint for 8 weeks 30 g 0   aspirin EC 81 MG tablet Take 81 mg by mouth daily. Swallow whole.     benzonatate (TESSALON) 100 MG capsule Take 200 mg by mouth 3 (three) times daily as needed.     Cholecalciferol (VITAMIN D3) 125 MCG (5000 UT) CAPS Take 5,000 Units by mouth daily.     furosemide (LASIX) 40 MG tablet TAKE 1 TABLET BY MOUTH TWICE A DAY 180 tablet 1   KLOR-CON M20 20 MEQ tablet Take 1 tablet (20 mEq total) by mouth 2 (two) times daily. Will take 3 a daily only as needed. 270 tablet 1   linaclotide (LINZESS) 145 MCG CAPS capsule Take 1 capsule (145 mcg total) by mouth daily before breakfast. 30 capsule 3    Multiple Vitamin (MULTIVITAMIN) tablet Take 1 tablet by mouth every other day. Gluten free/Vegetarian Multivitamin     Nintedanib (OFEV) 100 MG CAPS Take by mouth.     ondansetron (ZOFRAN-ODT) 4 MG disintegrating tablet TAKE 1 TABLET BY MOUTH EVERY 8 HOURS AS NEEDED FOR NAUSEA AND VOMITING 60 tablet 2   SYNTHROID 112 MCG tablet Take 1 tablet (112 mcg total) by mouth daily before breakfast. 90 tablet 0   traMADol (ULTRAM) 50 MG tablet Take 1 tablet (50 mg total) by mouth every 8 (eight) hours as needed for up to 5 days. 15 tablet 0   vitamin C (ASCORBIC ACID) 500 MG tablet Take 1,000 mg by mouth 2 (two) times daily.      vitamin E 1000 UNIT capsule Take 1,000 Units by mouth daily.      Review of Systems: a complete, 10pt review of systems was completed with pertinent positives and negatives as documented in the HPI  Physical Exam: Vitals:   03/29/22 0222 03/29/22 0451  BP: (!) 155/80 (!) 144/76  Pulse: 77 67  Resp: 18 14  Temp: (!) 97.5 F (36.4 C) (!) 97.5 F (36.4 C)  SpO2: 96% 96%   Gen: A&Ox3, no distress  Eyes: lids and conjunctivae normal, no icterus. Pupils equally round and reactive to light.  Neck: supple without mass or thyromegaly Chest: respiratory effort is normal. Breath sounds equal.  Cardiovascular: RRR with palpable distal pulses, no pedal edema Gastrointestinal: soft, mildly distended, diffusely tender without peritoneal signs. Lymphatic: no lymphadenopathy in the neck or groin Muscoloskeletal: no clubbing or cyanosis of the fingers.  Strength is symmetrical throughout.  Range of motion of bilateral upper and lower extremities normal without pain, crepitation or contracture. Neuro: cranial nerves grossly intact.  Sensation intact to light touch diffusely. Psych: appropriate mood and affect, normal insight/judgment intact  Skin: warm and dry      Latest Ref Rng & Units 03/28/2022    7:04 PM 03/20/2022   12:21 AM 03/13/2022    3:11 PM  CBC  WBC 4.0 - 10.5 K/uL  13.4  8.2  8.5   Hemoglobin 12.0 - 15.0 g/dL 12.5  13.1  11.9   Hematocrit 36.0 - 46.0 % 38.1  39.7  35.1   Platelets 150 - 400 K/uL 325  429  308  Latest Ref Rng & Units 03/28/2022    7:04 PM 03/20/2022   12:21 AM 03/13/2022    3:11 PM  CMP  Glucose 70 - 99 mg/dL 116  134  84   BUN 8 - 23 mg/dL '13  14  8   '$ Creatinine 0.44 - 1.00 mg/dL 0.84  0.97  0.74   Sodium 135 - 145 mmol/L 132  135  138   Potassium 3.5 - 5.1 mmol/L 3.7  4.2  4.1   Chloride 98 - 111 mmol/L 95  99  100   CO2 22 - 32 mmol/L '30  25  26   '$ Calcium 8.9 - 10.3 mg/dL 8.6  9.3  9.0   Total Protein 6.5 - 8.1 g/dL 8.1  8.7  7.1   Total Bilirubin 0.3 - 1.2 mg/dL 1.0  0.8  0.4   Alkaline Phos 38 - 126 U/L 77  83    AST 15 - 41 U/L 19  34  16   ALT 0 - 44 U/L '13  17  14     '$ Lab Results  Component Value Date   INR 1.1 (H) 05/07/2015   INR 1.02 10/20/2013   INR 0.9 09/22/2013    Imaging: No results found.   A/P: Worsening obstructive symptoms secondary to a lesion at the rectosigmoid junction.  Given history this may reflect crescendoing diverticular disease although malignancy has still not really been definitively ruled out.  Some of her bleeding and pain may be secondary to the fissure; treatment was initiated after her flex sig but has not really been long enough to relieve that.  Will discuss with my colorectal specialist partner, given obstructive symptoms she will likely need surgical intervention this admission.  On preliminary review of her MRI with radiology, this does not definitively look like malignancy.  Will proceed with repeat CT scan today given interval since last scan to evaluate for any perforation or abscess (latter was suspected on her last CT scan), start empiric Zosyn; I discussed with her that this may well end up requiring a colostomy depending on hospital course and intraoperative findings.  She is somewhat averse to that, understandably.  Surgery service will continue to  follow.    Patient Active Problem List   Diagnosis Date Noted   Allergic reaction caused by a drug 03/29/2022   Rectal mass 03/13/2022   Flank pain 03/13/2022   Lower abdominal pain 03/13/2022   Abnormal urine findings 03/13/2022   Urinary frequency 01/02/2022   Light headed 01/02/2022   Flat foot 11/19/2021   Pain of right orbit 11/15/2021   S/P total knee arthroplasty, left 11/13/2021   Ankle impingement syndrome, left 11/13/2021   Rectal bleed 08/22/2021   Abnormal thyroid blood test 08/22/2021   Abnormal kidney function 08/22/2021   Bronchitis 07/18/2021   Dumping syndrome 03/13/2020   Hyperglycemia 03/13/2020   Arthritis    Bronchial pneumonia    Colon polyp    DVT (deep venous thrombosis) (HCC)    Heart murmur    Migraines    Pancreatitis    Thyroid disease    Aortic root dilatation ([place]) 11/24/2019   Leukocytosis 11/06/2019   Diarrhea 11/06/2019   Generalized abdominal pain 11/06/2019   Pre-ulcerative corn or callous 09/07/2019   Nausea 09/07/2019   Pain of left calf 05/09/2019   Tibial pain 05/09/2019   Essential hypertension 02/08/2019   Healthcare maintenance 11/10/2018   Bradycardia 07/22/2018   Pansinusitis 06/23/2018   History of transient ischemic attack (TIA)  03/24/2018   IPF (idiopathic pulmonary fibrosis) ([place]) 03/24/2018   Allergic rhinitis 02/16/2018   Eustachian tube dysfunction, bilateral 02/16/2018   URI (upper respiratory infection) 02/16/2018   Seasonal allergic rhinitis due to pollen 02/16/2018   ILD (interstitial lung disease) ([place]) 01/07/2018   Dyspnea 01/07/2018   Acute respiratory failure ([place]) 01/06/2018   TIA (transient ischemic attack) 01/06/2018   Chronic hypokalemia 12/15/2017   Chronic cough 11/26/2017   Basal cell carcinoma (BCC) of neck 06/07/2017   Anisometropia 06/02/2017   Drusen of macula of both eyes 06/02/2017   Photopsia of left eye 06/02/2017   Cardiac murmur 29/92/4268   Diastolic dysfunction 34/19/6222    Situational anxiety 05/07/2017   Headache 05/07/2017   Mitral and aortic insufficiency 05/07/2017   Cellulitis of left lower extremity 04/30/2017   Migraine without status migrainosus, not intractable 06/09/2016   Insomnia 11/18/2015   Normal coronary arteries 05/20/2015   Chest pain at rest 05/20/2015   RBBB 05/20/2015   Abnormal CXR 05/20/2015   Abnormal CT of the chest 05/20/2015   Hx of myocardial infarction 05/08/2015   Bruising 05/08/2015   Upper airway cough syndrome 02/14/2015   Asthmatic bronchitis with acute exacerbation 02/06/2015   Mild intermittent asthma without complication 97/98/9211   Oliguria 12/28/2014   Hypothyroidism 10/25/2014   Fatigue 10/25/2014   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   Chronic venous insufficiency 10/24/2014   Varicose veins of leg with complications 94/17/4081   Degeneration of intervertebral disc of cervical region 06/29/2014   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   Generalized osteoarthritis of multiple sites 06/29/2014   History of stroke without residual deficits 06/29/2014   Palpitations 11/08/2013   SOB (shortness of breath) 10/03/2013   DOE (dyspnea on exertion) 10/03/2013   Edema of both legs 10/03/2013   Edema of lower extremity 10/03/2013   Subacute maxillary sinusitis 06/17/2013   Hair loss 11/25/2012   Alopecia 11/25/2012   Diverticulitis 07/14/2012   Diverticulitis of intestine 07/14/2012   Hammer toe 06/13/2012   Recurrent abdominal pain 04/17/2012   Abdominal pain 01/06/2012   Hypoglycemia 01/06/2012   History of stress test 05/21/2011   Hx of echocardiogram 01/27/2010   H/O cardiac catheterization 2004       Romana Juniper, MD [facility]  See AMION to contact appropriate on-call provider   MDM-[facility]

## 2022-03-29 NOTE — Assessment & Plan Note (Signed)
Stable. NO leg swelling or lesions

## 2022-03-29 NOTE — Assessment & Plan Note (Signed)
Patient reports severe itching after pain medication: MS vs dilaudid (both given in ED. )  Plan Benadryl 25 mg q6 prn  H2 blocker bid prn  Avoid dilaudid, MS

## 2022-03-29 NOTE — Assessment & Plan Note (Signed)
Last Echo March '22 EF 60-65%, grade I DD. Patient takes lasix bid. Well compensated on exam.  Plan Continue home meds

## 2022-03-29 NOTE — Subjective & Objective (Addendum)
Alexandria [name], a 76 y/o with a h/o hypothyroidism, grade I DD, HTN, IPF, diverticulosis, diverticulitis, DVT, chronic hypokalemia as well as other medical problems. She has a long h/o constipation and has recently tried Linzess but stopped after 2 weeks due to lack of efficacy by her report. She recently came to CT abd/pelvis 03/20/22 which reveals an enhancing lesion/mass just below the sigmoid-rectal junction. Flex Sig was performed 03/20/22 which revealed a polyupoid mass at the sigmoid-rectal junction. Bx was readout by pathology as an inflammatory poly w/o dysplasia or malignancy. Patient had MRI abd/pelvis 03/27/22 - reading pending. Due to continued constipation and diffuse abdominal pain, worst in the LUQ, she presented to [facility] for evaluation.

## 2022-03-29 NOTE — Progress Notes (Deleted)
Nurse was informed  by answering service, that Dr. Meda Coffee would be the dr on call tonight for this pt and that he will be up to see her.

## 2022-03-29 NOTE — Progress Notes (Signed)
Paged on call service, to make them aware of pt's arrival to room 1301. Awaiting for return call.

## 2022-03-29 NOTE — Progress Notes (Signed)
  PROGRESS NOTE  Patient admitted earlier this morning. See H&P.   Alexandria [name] is a 76 y/o with a history of hypothyroidism, grade I DD, HTN, IPF, diverticulosis, diverticulitis, DVT, chronic hypokalemia who has a long history of constipation and has recently tried Linzess but stopped after 2 weeks due to lack of efficacy by her report. She recently got CT abd/pelvis 03/20/22 which revealed an enhancing lesion/mass just below the sigmoid-rectal junction. Flex Sig was performed 03/20/22 which revealed a polyupoid mass at the sigmoid-rectal junction. Bx was readout by pathology as an inflammatory poly w/o dysplasia or malignancy. Patient had MRI abd/pelvis 03/26/22 - reading pending. Due to continued constipation and diffuse abdominal pain, worst in the LUQ, she presented to [facility] for evaluation. General surgery was consulted.   Patient seen and examined today. Admits to rectal pain and pressure similar to child birth. Has had 2 very small and hard bowel movements. Has no appetite.   Rectal mass with constipation, abdominal pain -Flex sig with biopsy 12/15 showed polypoid mass with inflammatory changes -Outpatient MRI 12/21 pending result  -EUS scheduled outpatient 04/30/22  -General surgery following  Chronic diastolic HF -Lasix with potassium   Hypothyroidism -Synthroid   History of idiopathic pulmonary fibrosis -Without exacerbation  Status is: Inpatient Remains inpatient appropriate because: surgical plan    Dessa Phi, DO [facility] 03/29/2022, 9:59 AM  Available via Epic secure chat 7am-7pm After these hours, please refer to coverage provider listed on [email or web address]

## 2022-03-30 DIAGNOSIS — N739 Female pelvic inflammatory disease, unspecified: Secondary | ICD-10-CM | POA: Diagnosis not present

## 2022-03-30 DIAGNOSIS — K5730 Diverticulosis of large intestine without perforation or abscess without bleeding: Secondary | ICD-10-CM | POA: Diagnosis not present

## 2022-03-30 LAB — CBC
HCT: 33.6 % — ABNORMAL LOW (ref 36.0–46.0)
Hemoglobin: 10.8 g/dL — ABNORMAL LOW (ref 12.0–15.0)
MCH: 32.9 pg (ref 26.0–34.0)
MCHC: 32.1 g/dL (ref 30.0–36.0)
MCV: 102.4 fL — ABNORMAL HIGH (ref 80.0–100.0)
Platelets: 280 10*3/uL (ref 150–400)
RBC: 3.28 MIL/uL — ABNORMAL LOW (ref 3.87–5.11)
RDW: 14.5 % (ref 11.5–15.5)
WBC: 9.8 10*3/uL (ref 4.0–10.5)
nRBC: 0 % (ref 0.0–0.2)

## 2022-03-30 LAB — BASIC METABOLIC PANEL
Anion gap: 9 (ref 5–15)
BUN: 10 mg/dL (ref 8–23)
CO2: 26 mmol/L (ref 22–32)
Calcium: 8.4 mg/dL — ABNORMAL LOW (ref 8.9–10.3)
Chloride: 100 mmol/L (ref 98–111)
Creatinine, Ser: 0.94 mg/dL (ref 0.44–1.00)
GFR, Estimated: >60 mL/min (ref >60)
Glucose, Bld: 100 mg/dL — ABNORMAL HIGH (ref 70–99)
Potassium: 4 mmol/L (ref 3.5–5.1)
Sodium: 135 mmol/L (ref 135–145)

## 2022-03-30 LAB — PROTIME-INR
INR: 1.2 (ref 0.8–1.2)
Prothrombin Time: 15.2 seconds (ref 11.4–15.2)

## 2022-03-30 LAB — PREALBUMIN: Prealbumin: 6 mg/dL — ABNORMAL LOW (ref 18–38)

## 2022-03-30 MED ORDER — SODIUM CHLORIDE 0.9 % IV SOLN
12.5000 mg | Freq: Four times a day (QID) | INTRAVENOUS | Status: DC | PRN
  Administered 2022-04-03: 12.5 mg via INTRAVENOUS
  Filled 2022-03-30: qty 12.5
  Filled 2022-03-30: qty 0.5

## 2022-03-30 MED ORDER — ONDANSETRON HCL 4 MG/2ML IJ SOLN
4.0000 mg | Freq: Four times a day (QID) | INTRAMUSCULAR | Status: DC | PRN
  Administered 2022-03-30 – 2022-04-08 (×5): 4 mg via INTRAVENOUS
  Filled 2022-03-30 (×7): qty 2

## 2022-03-30 MED ORDER — FENTANYL CITRATE PF 50 MCG/ML IJ SOSY
12.5000 ug | PREFILLED_SYRINGE | INTRAMUSCULAR | Status: DC | PRN
  Administered 2022-03-30 – 2022-04-04 (×10): 12.5 ug via INTRAVENOUS
  Filled 2022-03-30 (×11): qty 1

## 2022-03-30 NOTE — Progress Notes (Signed)
CT reviewed again by Dr. Denna Haggard, it does not appear that the fluid collection are amenable for percutaneous drain placement.   Recommends antibiotic and possible re-CT in 7-10 days.  Dr. Kae Heller notified.   Will d/c drain placement order. Pt  NPO, diet order to be placed by surgery or primary team.    Tera Mater PA-C 03/30/2022 8:33 AM

## 2022-03-30 NOTE — Progress Notes (Signed)
PROGRESS NOTE    Alexandria [name]  UJW:[id number] DOB: 1945-04-08 DOA: 03/28/2022 PCP: [name], DO     Brief Narrative:  Alexandria [name] is a 76 y/o with a history of hypothyroidism, grade I DD, HTN, IPF, diverticulosis, diverticulitis, DVT, chronic hypokalemia who has a long history of constipation and has recently tried Linzess but stopped after 2 weeks due to lack of efficacy by her report. She recently got CT abd/pelvis 03/20/22 which revealed an enhancing lesion/mass just below the sigmoid-rectal junction. Flex Sig was performed 03/20/22 which revealed a polyupoid mass at the sigmoid-rectal junction. Bx was readout by pathology as an inflammatory poly w/o dysplasia or malignancy. Patient had MRI abd/pelvis 03/26/22 - reading pending. Due to continued constipation and diffuse abdominal pain, worst in the LUQ, she presented to [facility] for evaluation. General surgery was consulted. Patient underwent repeat CT abd/pelvis and thought to have complicated diverticulitis with abscess. Unable to drain per IR.   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 Distal sigmoid colon shows wall thickening and adjacent inflammation. There is an adjacent irregular fluid collection consistent with a peridiverticular abscess in the posteroinferior pelvis that measures 6 x  2.6 x 2.6 cm. Inflammatory changes have become more evident compared to the prior CTs. No bowel obstruction. -EUS scheduled outpatient 04/30/22  -General surgery following -Unable to drain abscess per IR. Continue zosyn    Chronic diastolic HF -Lasix with potassium    Hypothyroidism -Synthroid    History of idiopathic pulmonary fibrosis -Without exacerbation -Ofev    DVT prophylaxis:  Place and maintain sequential compression device Start: 03/30/22 0940  Code Status: Full Family Communication: none at bedside  Disposition Plan:  Status is: Inpatient Remains inpatient appropriate because: IV antibiotics  Consultants:  Gen surg IR  Procedures:  None   Antimicrobials:  Anti-infectives (From admission, onward)    Start     Dose/Rate Route Frequency Ordered Stop   03/29/22 0800  piperacillin-tazobactam (ZOSYN) IVPB 3.375 g        3.375 g 12.5 mL/hr over 240 Minutes Intravenous Every 8 hours 03/29/22 0746 04/03/22 0800        Objective: Vitals:   03/29/22 0959 03/29/22 1405 03/29/22 2232 03/30/22 0700  BP: (!) 125/59 111/67 (!) 152/70 137/77  Pulse: 78 83 79 75  Resp: '18 18 18 18  '$ Temp: (!) 97.4 F (36.3 C) 98.3 F (36.8 C) 99.5 F (37.5 C) 98.2 F (36.8 C)  TempSrc: Oral Oral Oral Oral  SpO2: 94% 95% 96% 96%  Weight:      Height:        Intake/Output Summary (Last 24 hours) at 03/30/2022 1038 Last data filed at 03/30/2022 1001 Gross per 24 hour  Intake 1839.4 ml  Output 0 ml  Net 1839.4 ml   Filed Weights   03/28/22 1738  Weight: 63 kg    Examination:  General exam: Appears calm but  uncomfortable  Respiratory system: Clear to auscultation. Respiratory effort normal. No respiratory distress. No conversational dyspnea.  Cardiovascular system: S1 & S2 heard, RRR. No murmurs. No pedal edema. Gastrointestinal system: Abdomen is nondistended, soft, TTP  Central nervous system: Alert and oriented. No focal neurological deficits. Speech clear.   Extremities: Symmetric in appearance  Skin: No rashes, lesions or ulcers on exposed skin  Psychiatry: Judgement and insight appear normal. Mood & affect appropriate.   Data Reviewed: I have personally reviewed following labs and imaging studies  CBC: Recent Labs  Lab 03/28/22 1904 03/29/22 2355  WBC 13.4* 9.8  NEUTROABS 9.7*  --   HGB 12.5 10.8*  HCT 38.1 33.6*  MCV 99.7 102.4*  PLT 325 161   Basic Metabolic Panel: Recent Labs  Lab 03/28/22 1904 03/29/22 2355  NA 132* 135  K 3.7 4.0  CL 95* 100  CO2 30 26  GLUCOSE 116* 100*  BUN 13 10  CREATININE 0.84 0.94  CALCIUM 8.6* 8.4*   GFR: Estimated Creatinine Clearance: 43.3 mL/min (by C-G formula based on SCr of 0.94 mg/dL). Liver Function Tests: Recent Labs  Lab 03/28/22 1904  AST 19  ALT 13  ALKPHOS 77  BILITOT 1.0  PROT 8.1  ALBUMIN 3.1*   Recent Labs  Lab 03/28/22 1904  LIPASE 25   No results for input(s): "AMMONIA" in the last 168 hours. Coagulation Profile: Recent Labs  Lab 03/30/22 0129  INR 1.2   Cardiac Enzymes: No results for input(s): "CKTOTAL", "CKMB", "CKMBINDEX", "TROPONINI" in the last 168 hours. BNP (last 3 results) No results for input(s): "PROBNP" in the last 8760 hours. HbA1C: No results for input(s): "HGBA1C" in the last 72 hours. CBG: Recent Labs  Lab 03/29/22 2355  GLUCAP 103*   Lipid Profile: No results for input(s): "CHOL", "HDL", "LDLCALC", "TRIG", "CHOLHDL", "LDLDIRECT" in the last 72 hours. Thyroid Function Tests: No results for input(s): "TSH", "T4TOTAL", "FREET4", "T3FREE", "THYROIDAB" in the last 72 hours. Anemia Panel: No results for input(s): "VITAMINB12", "FOLATE", "FERRITIN", "TIBC", "IRON", "RETICCTPCT" in the last 72 hours. Sepsis Labs: No results for input(s): "PROCALCITON", "LATICACIDVEN" in the last 168 hours.  No results found for this or any previous visit (from the past 240 hour(s)).    Radiology Studies: CT ABDOMEN PELVIS W CONTRAST  Result  Date: 03/29/2022 CLINICAL DATA:  Constipation. Clinical suspicion for diverticulitis. EXAM: CT ABDOMEN AND PELVIS WITH CONTRAST TECHNIQUE: Multidetector CT imaging of the abdomen and pelvis was performed using the standard protocol following bolus administration of intravenous contrast. RADIATION DOSE REDUCTION: This exam was performed according to the departmental dose-optimization program which includes automated exposure control, adjustment of the mA and/or kV according to patient size and/or use of iterative reconstruction technique. CONTRAST:  186m OMNIPAQUE IOHEXOL 300 MG/ML  SOLN COMPARISON:  03/20/2022.  03/13/2022. FINDINGS: Lower chest: Chronic fibrotic changes at the lung bases, stable. No acute findings. Hepatobiliary: No focal liver abnormality is seen. No gallstones, gallbladder wall thickening, or biliary dilatation. Pancreas: Pancreatic atrophy.  No mass.  No inflammation. Spleen: Normal in size without focal abnormality. Adrenals/Urinary Tract: Adrenal glands are unremarkable. Kidneys are normal, without renal calculi, focal lesion, or hydronephrosis. Bladder is unremarkable. Stomach/Bowel: Wall thickening of the distal sigmoid colon. There is adjacent inflammatory change. An irregular extraluminal fluid collection, containing non dependent air, tracks along the lower sigmoid colon to the level of the rectosigmoid junction, measuring approximately 6 cm from superior to inferior by 2.6 x 2.6 cm transversely. There are numerous diverticula along the remainder  of the sigmoid colon with no other areas of wall thickening or inflammation. Remainder of the colon is normal in caliber with no evidence of inflammation. Normal stomach and small bowel. Normal appendix. Vascular/Lymphatic: Aortic atherosclerosis. No aneurysm. No enlarged lymph nodes. Reproductive: Status post hysterectomy.  No pelvic/adnexal masses. Other: No hernia.  No ascites.  No free air. Musculoskeletal: No fracture or acute finding.  No  bone lesion. IMPRESSION: 1. Current findings are consistent with complicated sigmoid diverticulitis. Distal sigmoid colon shows wall thickening and adjacent inflammation. There is an adjacent irregular fluid collection consistent with a peridiverticular abscess in the posteroinferior pelvis that measures 6 x 2.6 x 2.6 cm. Inflammatory changes have become more evident compared to the prior CTs. No bowel obstruction. 2. No other acute abnormality.  No free air. Electronically Signed   By: Lajean Manes M.D.   On: 03/29/2022 11:32      Scheduled Meds:  aspirin EC  81 mg Oral Daily   furosemide  40 mg Oral BID   levothyroxine  112 mcg Oral QAC breakfast   Nintedanib  100 mg Oral Q12H   nitroGLYCERIN  0.5 inch Topical Q6H   potassium chloride SA  20 mEq Oral BID   Continuous Infusions:  sodium chloride 50 mL/hr at 03/29/22 0432   piperacillin-tazobactam (ZOSYN)  IV 3.375 g (03/30/22 0902)     LOS: 1 day   Time spent: 25 minutes   Dessa Phi, DO [facility] 03/30/2022, 10:38 AM   Available via Epic secure chat 7am-7pm After these hours, please refer to coverage provider listed on [email or web address]

## 2022-03-30 NOTE — Progress Notes (Signed)
Subjective/Chief Complaint: Continued abdominal pain.  Endorses flatus but no bowel movement.  Fairly high volume of clear liquids yesterday.    Objective: Vital signs in last 24 hours: Temp:  [97.4 F (36.3 C)-99.5 F (37.5 C)] 98.2 F (36.8 C) (12/25 0700) Pulse Rate:  [75-83] 75 (12/25 0700) Resp:  [18] 18 (12/25 0700) BP: (111-152)/(59-77) 137/77 (12/25 0700) SpO2:  [94 %-96 %] 96 % (12/25 0700) Last BM Date : 03/24/22  Intake/Output from previous day: 12/24 0701 - 12/25 0700 In: 2199.4 [P.O.:1200; I.V.:818.6; IV Piggyback:180.8] Out: 0  Intake/Output this shift: No intake/output data recorded.  Alert, well-appearing, no distress Unlabored respirations Abdomen stably distended, diffusely tender without peritoneal signs  Lab Results:  Recent Labs    03/28/22 1904 03/29/22 2355  WBC 13.4* 9.8  HGB 12.5 10.8*  HCT 38.1 33.6*  PLT 325 280   BMET Recent Labs    03/28/22 1904 03/29/22 2355  NA 132* 135  K 3.7 4.0  CL 95* 100  CO2 30 26  GLUCOSE 116* 100*  BUN 13 10  CREATININE 0.84 0.94  CALCIUM 8.6* 8.4*   PT/INR Recent Labs    03/30/22 0129  LABPROT 15.2  INR 1.2   ABG No results for input(s): "PHART", "HCO3" in the last 72 hours.  Invalid input(s): "PCO2", "PO2"  Studies/Results: CT ABDOMEN PELVIS W CONTRAST  Result Date: 03/29/2022 CLINICAL DATA:  Constipation. Clinical suspicion for diverticulitis. EXAM: CT ABDOMEN AND PELVIS WITH CONTRAST TECHNIQUE: Multidetector CT imaging of the abdomen and pelvis was performed using the standard protocol following bolus administration of intravenous contrast. RADIATION DOSE REDUCTION: This exam was performed according to the departmental dose-optimization program which includes automated exposure control, adjustment of the mA and/or kV according to patient size and/or use of iterative reconstruction technique. CONTRAST:  146m OMNIPAQUE IOHEXOL 300 MG/ML  SOLN COMPARISON:  03/20/2022.  03/13/2022.  FINDINGS: Lower chest: Chronic fibrotic changes at the lung bases, stable. No acute findings. Hepatobiliary: No focal liver abnormality is seen. No gallstones, gallbladder wall thickening, or biliary dilatation. Pancreas: Pancreatic atrophy.  No mass.  No inflammation. Spleen: Normal in size without focal abnormality. Adrenals/Urinary Tract: Adrenal glands are unremarkable. Kidneys are normal, without renal calculi, focal lesion, or hydronephrosis. Bladder is unremarkable. Stomach/Bowel: Wall thickening of the distal sigmoid colon. There is adjacent inflammatory change. An irregular extraluminal fluid collection, containing non dependent air, tracks along the lower sigmoid colon to the level of the rectosigmoid junction, measuring approximately 6 cm from superior to inferior by 2.6 x 2.6 cm transversely. There are numerous diverticula along the remainder of the sigmoid colon with no other areas of wall thickening or inflammation. Remainder of the colon is normal in caliber with no evidence of inflammation. Normal stomach and small bowel. Normal appendix. Vascular/Lymphatic: Aortic atherosclerosis. No aneurysm. No enlarged lymph nodes. Reproductive: Status post hysterectomy.  No pelvic/adnexal masses. Other: No hernia.  No ascites.  No free air. Musculoskeletal: No fracture or acute finding.  No bone lesion. IMPRESSION: 1. Current findings are consistent with complicated sigmoid diverticulitis. Distal sigmoid colon shows wall thickening and adjacent inflammation. There is an adjacent irregular fluid collection consistent with a peridiverticular abscess in the posteroinferior pelvis that measures 6 x 2.6 x 2.6 cm. Inflammatory changes have become more evident compared to the prior CTs. No bowel obstruction. 2. No other acute abnormality.  No free air. Electronically Signed   By: DLajean ManesM.D.   On: 03/29/2022 11:32    Anti-infectives: Anti-infectives (From admission,  onward)    Start     Dose/Rate Route  Frequency Ordered Stop   03/29/22 0800  piperacillin-tazobactam (ZOSYN) IVPB 3.375 g        3.375 g 12.5 mL/hr over 240 Minutes Intravenous Every 8 hours 03/29/22 0746 04/03/22 0800       Assessment/Plan:  Likely end-stage diverticulitis.  CT yesterday with pelvic abscess, assessed by interventional radiology and this is not currently amenable to percutaneous drainage.  Continue IV antibiotics, bowel rest.  Discussed limiting p.o. intake.  If persistent obstructive symptoms despite this she will need surgical intervention this admission.   Patient Active Problem List   Diagnosis Date Noted   Allergic reaction caused by a drug 03/29/2022   Rectal mass 03/13/2022   Flank pain 03/13/2022   Lower abdominal pain 03/13/2022   Abnormal urine findings 03/13/2022   Urinary frequency 01/02/2022   Light headed 01/02/2022   Flat foot 11/19/2021   Pain of right orbit 11/15/2021   S/P total knee arthroplasty, left 11/13/2021   Ankle impingement syndrome, left 11/13/2021   Rectal bleed 08/22/2021   Abnormal thyroid blood test 08/22/2021   Abnormal kidney function 08/22/2021   Bronchitis 07/18/2021   Dumping syndrome 03/13/2020   Hyperglycemia 03/13/2020   Arthritis    Bronchial pneumonia    Colon polyp    DVT (deep venous thrombosis) (HCC)    Heart murmur    Migraines    Pancreatitis    Thyroid disease    Aortic root dilatation ([place]) 11/24/2019   Leukocytosis 11/06/2019   Diarrhea 11/06/2019   Generalized abdominal pain 11/06/2019   Pre-ulcerative corn or callous 09/07/2019   Nausea 09/07/2019   Pain of left calf 05/09/2019   Tibial pain 05/09/2019   Essential hypertension 02/08/2019   Healthcare maintenance 11/10/2018   Bradycardia 07/22/2018   Pansinusitis 06/23/2018   History of transient ischemic attack (TIA) 03/24/2018   IPF (idiopathic pulmonary fibrosis) ([place]) 03/24/2018   Allergic rhinitis 02/16/2018   Eustachian tube dysfunction, bilateral 02/16/2018   URI (upper  respiratory infection) 02/16/2018   Seasonal allergic rhinitis due to pollen 02/16/2018   ILD (interstitial lung disease) ([place]) 01/07/2018   Dyspnea 01/07/2018   Acute respiratory failure ([place]) 01/06/2018   TIA (transient ischemic attack) 01/06/2018   Chronic hypokalemia 12/15/2017   Chronic cough 11/26/2017   Basal cell carcinoma (BCC) of neck 06/07/2017   Anisometropia 06/02/2017   [place]en of macula of both eyes 06/02/2017   Photopsia of left eye 06/02/2017   Cardiac murmur 27/25/3664   Diastolic dysfunction 40/34/7425   Situational anxiety 05/07/2017   Headache 05/07/2017   Mitral and aortic insufficiency 05/07/2017   Cellulitis of left lower extremity 04/30/2017   Migraine without status migrainosus, not intractable 06/09/2016   Insomnia 11/18/2015   Normal coronary arteries 05/20/2015   Chest pain at rest 05/20/2015   RBBB 05/20/2015   Abnormal CXR 05/20/2015   Abnormal CT of the chest 05/20/2015   Hx of myocardial infarction 05/08/2015   Bruising 05/08/2015   Upper airway cough syndrome 02/14/2015   Asthmatic bronchitis with acute exacerbation 02/06/2015   Mild intermittent asthma without complication 95/63/8756   Oliguria 12/28/2014   Hypothyroidism 10/25/2014   Fatigue 10/25/2014   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   Chronic venous insufficiency 10/24/2014   Varicose veins of leg with complications 43/32/9518   Degeneration of intervertebral disc of cervical region 06/29/2014   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   Generalized osteoarthritis of multiple sites 06/29/2014   History of stroke without residual  deficits 06/29/2014   Palpitations 11/08/2013   SOB (shortness of breath) 10/03/2013   DOE (dyspnea on exertion) 10/03/2013   Edema of both legs 10/03/2013   Edema of lower extremity 10/03/2013   Subacute maxillary sinusitis 06/17/2013   Hair loss 11/25/2012   Alopecia 11/25/2012   Diverticulitis 07/14/2012   Diverticulitis of  intestine 07/14/2012   Hammer toe 06/13/2012   Recurrent abdominal pain 04/17/2012   Abdominal pain 01/06/2012   Hypoglycemia 01/06/2012   History of stress test 05/21/2011   Hx of echocardiogram 01/27/2010   H/O cardiac catheterization 2004      LOS: 1 day    Alexandria [name] 03/30/2022

## 2022-03-31 ENCOUNTER — Other Ambulatory Visit: Payer: Self-pay

## 2022-03-31 ENCOUNTER — Encounter (HOSPITAL_COMMUNITY): Payer: Self-pay | Admitting: Internal Medicine

## 2022-03-31 ENCOUNTER — Inpatient Hospital Stay (HOSPITAL_COMMUNITY): Payer: Medicare Other | Admitting: Anesthesiology

## 2022-03-31 ENCOUNTER — Encounter (HOSPITAL_COMMUNITY)
Admission: EM | Disposition: A | Discharge status: Home Health | Payer: Self-pay | Source: Home / Self Care | Attending: Internal Medicine

## 2022-03-31 DIAGNOSIS — K631 Perforation of intestine (nontraumatic): Secondary | ICD-10-CM

## 2022-03-31 DIAGNOSIS — K56609 Unspecified intestinal obstruction, unspecified as to partial versus complete obstruction: Secondary | ICD-10-CM

## 2022-03-31 DIAGNOSIS — K630 Abscess of intestine: Secondary | ICD-10-CM

## 2022-03-31 DIAGNOSIS — K5730 Diverticulosis of large intestine without perforation or abscess without bleeding: Secondary | ICD-10-CM | POA: Diagnosis not present

## 2022-03-31 HISTORY — PX: XI ROBOTIC ASSISTED LOWER ANTERIOR RESECTION: SHX6558

## 2022-03-31 LAB — CBC
HCT: 31.2 % — ABNORMAL LOW (ref 36.0–46.0)
Hemoglobin: 10 g/dL — ABNORMAL LOW (ref 12.0–15.0)
MCH: 32.6 pg (ref 26.0–34.0)
MCHC: 32.1 g/dL (ref 30.0–36.0)
MCV: 101.6 fL — ABNORMAL HIGH (ref 80.0–100.0)
Platelets: 260 10*3/uL (ref 150–400)
RBC: 3.07 MIL/uL — ABNORMAL LOW (ref 3.87–5.11)
RDW: 14.3 % (ref 11.5–15.5)
WBC: 6.8 10*3/uL (ref 4.0–10.5)
nRBC: 0 % (ref 0.0–0.2)

## 2022-03-31 LAB — BASIC METABOLIC PANEL
Anion gap: 9 (ref 5–15)
BUN: 11 mg/dL (ref 8–23)
CO2: 27 mmol/L (ref 22–32)
Calcium: 8.1 mg/dL — ABNORMAL LOW (ref 8.9–10.3)
Chloride: 99 mmol/L (ref 98–111)
Creatinine, Ser: 1.01 mg/dL — ABNORMAL HIGH (ref 0.44–1.00)
GFR, Estimated: 57 mL/min — ABNORMAL LOW (ref >60)
Glucose, Bld: 81 mg/dL (ref 70–99)
Potassium: 3.5 mmol/L (ref 3.5–5.1)
Sodium: 135 mmol/L (ref 135–145)

## 2022-03-31 LAB — ABO/RH: ABO/RH(D): A NEG

## 2022-03-31 LAB — MAGNESIUM: Magnesium: 2.3 mg/dL (ref 1.7–2.4)

## 2022-03-31 SURGERY — RESECTION, RECTUM, LOW ANTERIOR, ROBOT-ASSISTED
Anesthesia: General

## 2022-03-31 MED ORDER — LACTATED RINGERS IV BOLUS: 1000.0000 mL | Freq: Three times a day (TID) | INTRAVENOUS | Status: DC | PRN

## 2022-03-31 MED ORDER — LACTATED RINGERS IV SOLN: INTRAVENOUS | Status: DC | PRN

## 2022-03-31 MED ORDER — BUPIVACAINE-EPINEPHRINE (PF) 0.5% -1:200000 IJ SOLN
INTRAMUSCULAR | Status: AC
  Filled 2022-03-31: qty 30

## 2022-03-31 MED ORDER — SODIUM CHLORIDE 0.9% FLUSH: 3.0000 mL | INTRAVENOUS | Status: DC | PRN

## 2022-03-31 MED ORDER — SUCCINYLCHOLINE CHLORIDE 200 MG/10ML IV SOSY
PREFILLED_SYRINGE | INTRAVENOUS | Status: DC | PRN
  Administered 2022-03-31: 140 mg via INTRAVENOUS

## 2022-03-31 MED ORDER — SODIUM CHLORIDE 0.9 % IV SOLN: 8.0000 mg | Freq: Four times a day (QID) | INTRAVENOUS | Status: DC | PRN

## 2022-03-31 MED ORDER — ENSURE SURGERY PO LIQD
237.0000 mL | Freq: Two times a day (BID) | ORAL | Status: DC
  Administered 2022-04-01 – 2022-04-02 (×3): 237 mL via ORAL

## 2022-03-31 MED ORDER — PHENYLEPHRINE HCL (PRESSORS) 10 MG/ML IV SOLN
INTRAVENOUS | Status: AC
  Filled 2022-03-31: qty 1

## 2022-03-31 MED ORDER — CHLORHEXIDINE GLUCONATE 0.12 % MT SOLN
15.0000 mL | Freq: Once | OROMUCOSAL | Status: AC
  Administered 2022-03-31: 15 mL via OROMUCOSAL

## 2022-03-31 MED ORDER — FENTANYL CITRATE (PF) 250 MCG/5ML IJ SOLN
INTRAMUSCULAR | Status: AC
  Filled 2022-03-31: qty 5

## 2022-03-31 MED ORDER — BUPIVACAINE LIPOSOME 1.3 % IJ SUSP: 20.0000 mL | Freq: Once | INTRAMUSCULAR | Status: DC

## 2022-03-31 MED ORDER — LORAZEPAM 2 MG/ML IJ SOLN
0.5000 mg | Freq: Three times a day (TID) | INTRAMUSCULAR | Status: DC | PRN
  Administered 2022-04-06: 1 mg via INTRAVENOUS
  Filled 2022-03-31: qty 1

## 2022-03-31 MED ORDER — PROPOFOL 10 MG/ML IV BOLUS
INTRAVENOUS | Status: AC
  Filled 2022-03-31: qty 20

## 2022-03-31 MED ORDER — METHOCARBAMOL 1000 MG/10ML IJ SOLN
500.0000 mg | Freq: Four times a day (QID) | INTRAVENOUS | Status: DC | PRN
  Filled 2022-03-31: qty 5

## 2022-03-31 MED ORDER — BUPIVACAINE LIPOSOME 1.3 % IJ SUSP
INTRAMUSCULAR | Status: DC | PRN
  Administered 2022-03-31: 20 mL

## 2022-03-31 MED ORDER — SODIUM CHLORIDE 0.9 % IV SOLN
2.0000 g | INTRAVENOUS | Status: AC
  Administered 2022-03-31: 2 g via INTRAVENOUS
  Filled 2022-03-31: qty 2

## 2022-03-31 MED ORDER — CALCIUM POLYCARBOPHIL 625 MG PO TABS
625.0000 mg | ORAL_TABLET | Freq: Two times a day (BID) | ORAL | Status: DC
  Administered 2022-03-31 – 2022-04-02 (×5): 625 mg via ORAL
  Filled 2022-03-31 (×5): qty 1

## 2022-03-31 MED ORDER — ALVIMOPAN 12 MG PO CAPS
12.0000 mg | ORAL_CAPSULE | ORAL | Status: AC
  Administered 2022-03-31: 12 mg via ORAL
  Filled 2022-03-31: qty 1

## 2022-03-31 MED ORDER — GABAPENTIN 100 MG PO CAPS
300.0000 mg | ORAL_CAPSULE | ORAL | Status: AC
  Administered 2022-03-31: 300 mg via ORAL
  Filled 2022-03-31: qty 3

## 2022-03-31 MED ORDER — MAGIC MOUTHWASH: 15.0000 mL | Freq: Four times a day (QID) | ORAL | Status: DC | PRN

## 2022-03-31 MED ORDER — BUPIVACAINE LIPOSOME 1.3 % IJ SUSP
INTRAMUSCULAR | Status: AC
  Filled 2022-03-31: qty 20

## 2022-03-31 MED ORDER — ONDANSETRON HCL 4 MG/2ML IJ SOLN: 4.0000 mg | Freq: Once | INTRAMUSCULAR | Status: DC | PRN

## 2022-03-31 MED ORDER — ONDANSETRON HCL 4 MG/2ML IJ SOLN
4.0000 mg | Freq: Four times a day (QID) | INTRAMUSCULAR | Status: DC | PRN
  Administered 2022-04-02 – 2022-04-21 (×18): 4 mg via INTRAVENOUS
  Filled 2022-03-31 (×18): qty 2

## 2022-03-31 MED ORDER — ENOXAPARIN SODIUM 40 MG/0.4ML IJ SOSY
40.0000 mg | PREFILLED_SYRINGE | Freq: Once | INTRAMUSCULAR | Status: AC
  Administered 2022-03-31: 40 mg via SUBCUTANEOUS
  Filled 2022-03-31: qty 0.4

## 2022-03-31 MED ORDER — 0.9 % SODIUM CHLORIDE (POUR BTL) OPTIME
TOPICAL | Status: DC | PRN
  Administered 2022-03-31: 6000 mL

## 2022-03-31 MED ORDER — TRAMADOL HCL 50 MG PO TABS
50.0000 mg | ORAL_TABLET | Freq: Four times a day (QID) | ORAL | Status: DC | PRN
  Administered 2022-03-31 – 2022-04-02 (×5): 50 mg via ORAL
  Filled 2022-03-31 (×5): qty 1

## 2022-03-31 MED ORDER — METHOCARBAMOL 500 MG PO TABS
1000.0000 mg | ORAL_TABLET | Freq: Four times a day (QID) | ORAL | Status: DC | PRN
  Administered 2022-04-01: 1000 mg via ORAL
  Filled 2022-03-31: qty 2

## 2022-03-31 MED ORDER — FENTANYL CITRATE (PF) 100 MCG/2ML IJ SOLN
INTRAMUSCULAR | Status: DC | PRN
  Administered 2022-03-31 (×4): 50 ug via INTRAVENOUS

## 2022-03-31 MED ORDER — VITAMIN C 500 MG PO TABS
500.0000 mg | ORAL_TABLET | Freq: Two times a day (BID) | ORAL | Status: DC
  Administered 2022-03-31 – 2022-04-02 (×5): 500 mg via ORAL
  Filled 2022-03-31 (×6): qty 1

## 2022-03-31 MED ORDER — SODIUM CHLORIDE 0.9% FLUSH
3.0000 mL | Freq: Two times a day (BID) | INTRAVENOUS | Status: DC
  Administered 2022-03-31 – 2022-04-20 (×10): 3 mL via INTRAVENOUS
  Administered 2022-04-21: 5 mL via INTRAVENOUS

## 2022-03-31 MED ORDER — LIP MEDEX EX OINT
TOPICAL_OINTMENT | Freq: Two times a day (BID) | CUTANEOUS | Status: DC
  Administered 2022-04-01: 75 via TOPICAL
  Administered 2022-04-03: 1 via TOPICAL
  Administered 2022-04-05: 75 via TOPICAL
  Filled 2022-03-31: qty 7

## 2022-03-31 MED ORDER — ACETAMINOPHEN 500 MG PO TABS
1000.0000 mg | ORAL_TABLET | Freq: Four times a day (QID) | ORAL | Status: DC
  Administered 2022-03-31 – 2022-04-02 (×7): 1000 mg via ORAL
  Filled 2022-03-31 (×7): qty 2

## 2022-03-31 MED ORDER — ACETAMINOPHEN 500 MG PO TABS
1000.0000 mg | ORAL_TABLET | ORAL | Status: AC
  Administered 2022-03-31: 1000 mg via ORAL
  Filled 2022-03-31: qty 2

## 2022-03-31 MED ORDER — BUPIVACAINE-EPINEPHRINE 0.5% -1:200000 IJ SOLN
INTRAMUSCULAR | Status: DC | PRN
  Administered 2022-03-31: 30 mL

## 2022-03-31 MED ORDER — PROPOFOL 10 MG/ML IV BOLUS
INTRAVENOUS | Status: DC | PRN
  Administered 2022-03-31: 200 mg via INTRAVENOUS

## 2022-03-31 MED ORDER — SIMETHICONE 40 MG/0.6ML PO SUSP: 80.0000 mg | Freq: Four times a day (QID) | ORAL | Status: DC | PRN

## 2022-03-31 MED ORDER — SODIUM CHLORIDE (PF) 0.9 % IJ SOLN
INTRAMUSCULAR | Status: DC | PRN
  Administered 2022-03-31: 30 mL

## 2022-03-31 MED ORDER — LIDOCAINE 2% (20 MG/ML) 5 ML SYRINGE
INTRAMUSCULAR | Status: DC | PRN
  Administered 2022-03-31: 100 mg via INTRAVENOUS

## 2022-03-31 MED ORDER — TAB-A-VITE/IRON PO TABS
1.0000 | ORAL_TABLET | Freq: Every day | ORAL | Status: DC
  Administered 2022-04-01 – 2022-04-02 (×2): 1 via ORAL
  Filled 2022-03-31 (×4): qty 1

## 2022-03-31 MED ORDER — SUGAMMADEX SODIUM 200 MG/2ML IV SOLN
INTRAVENOUS | Status: DC | PRN
  Administered 2022-03-31: 200 mg via INTRAVENOUS

## 2022-03-31 MED ORDER — ONDANSETRON HCL 4 MG/2ML IJ SOLN
INTRAMUSCULAR | Status: DC | PRN
  Administered 2022-03-31: 4 mg via INTRAVENOUS

## 2022-03-31 MED ORDER — PHENYLEPHRINE 80 MCG/ML (10ML) SYRINGE FOR IV PUSH (FOR BLOOD PRESSURE SUPPORT)
PREFILLED_SYRINGE | INTRAVENOUS | Status: DC | PRN
  Administered 2022-03-31: 80 ug via INTRAVENOUS
  Administered 2022-03-31 (×2): 160 ug via INTRAVENOUS
  Administered 2022-03-31: 80 ug via INTRAVENOUS
  Administered 2022-03-31: 240 ug via INTRAVENOUS
  Administered 2022-03-31: 80 ug via INTRAVENOUS
  Administered 2022-03-31: 160 ug via INTRAVENOUS
  Administered 2022-03-31: 80 ug via INTRAVENOUS
  Administered 2022-03-31: 160 ug via INTRAVENOUS
  Administered 2022-03-31: 80 ug via INTRAVENOUS
  Administered 2022-03-31: 160 ug via INTRAVENOUS

## 2022-03-31 MED ORDER — LIDOCAINE HCL (PF) 2 % IJ SOLN
INTRAMUSCULAR | Status: DC | PRN
  Administered 2022-03-31: 1.5 mg/kg/h via INTRADERMAL

## 2022-03-31 MED ORDER — METOPROLOL TARTRATE 5 MG/5ML IV SOLN
5.0000 mg | Freq: Four times a day (QID) | INTRAVENOUS | Status: DC | PRN
  Administered 2022-04-03 – 2022-04-05 (×2): 5 mg via INTRAVENOUS
  Filled 2022-03-31 (×2): qty 5

## 2022-03-31 MED ORDER — STERILE WATER FOR INJECTION IJ SOLN
INTRAMUSCULAR | Status: AC
  Filled 2022-03-31: qty 10

## 2022-03-31 MED ORDER — DEXAMETHASONE SODIUM PHOSPHATE 10 MG/ML IJ SOLN
INTRAMUSCULAR | Status: DC | PRN
  Administered 2022-03-31: 10 mg via INTRAVENOUS

## 2022-03-31 MED ORDER — SODIUM CHLORIDE 0.9 % IV SOLN: 250.0000 mL | INTRAVENOUS | Status: DC | PRN

## 2022-03-31 MED ORDER — BISACODYL 5 MG PO TBEC: 20.0000 mg | DELAYED_RELEASE_TABLET | Freq: Once | ORAL | Status: DC

## 2022-03-31 MED ORDER — LACTATED RINGERS IV SOLN: INTRAVENOUS | Status: DC

## 2022-03-31 MED ORDER — ROCURONIUM BROMIDE 10 MG/ML (PF) SYRINGE
PREFILLED_SYRINGE | INTRAVENOUS | Status: DC | PRN
  Administered 2022-03-31: 20 mg via INTRAVENOUS
  Administered 2022-03-31: 50 mg via INTRAVENOUS
  Administered 2022-03-31: 20 mg via INTRAVENOUS

## 2022-03-31 MED ORDER — LACTATED RINGERS IR SOLN
Status: DC | PRN
  Administered 2022-03-31: 1000 mL

## 2022-03-31 MED ORDER — STERILE WATER FOR IRRIGATION IR SOLN
Status: DC | PRN
  Administered 2022-03-31: 1000 mL

## 2022-03-31 MED ORDER — GABAPENTIN 100 MG PO CAPS
200.0000 mg | ORAL_CAPSULE | Freq: Three times a day (TID) | ORAL | Status: DC
  Administered 2022-03-31 – 2022-04-02 (×7): 200 mg via ORAL
  Filled 2022-03-31 (×7): qty 2

## 2022-03-31 MED ORDER — FENTANYL CITRATE PF 50 MCG/ML IJ SOSY: 25.0000 ug | PREFILLED_SYRINGE | INTRAMUSCULAR | Status: DC | PRN

## 2022-03-31 SURGICAL SUPPLY — 122 items
APL PRP STRL LF DISP 70% ISPRP (MISCELLANEOUS)
APPLIER CLIP 5 13 M/L LIGAMAX5 (MISCELLANEOUS)
APPLIER CLIP ROT 10 11.4 M/L (STAPLE)
APR CLP MED LRG 11.4X10 (STAPLE)
APR CLP MED LRG 5 ANG JAW (MISCELLANEOUS)
BAG COUNTER SPONGE SURGICOUNT (BAG) ×2 IMPLANT
BAG SPNG CNTER NS LX DISP (BAG) ×1
BLADE EXTENDED COATED 6.5IN (ELECTRODE) IMPLANT
CANNULA REDUC XI 12-8 STAPL (CANNULA)
CANNULA REDUCER 12-8 DVNC XI (CANNULA) IMPLANT
CELLS DAT CNTRL 66122 CELL SVR (MISCELLANEOUS) IMPLANT
CHLORAPREP W/TINT 26 (MISCELLANEOUS) IMPLANT
CLIP APPLIE 5 13 M/L LIGAMAX5 (MISCELLANEOUS) IMPLANT
CLIP APPLIE ROT 10 11.4 M/L (STAPLE) IMPLANT
COVER SURGICAL LIGHT HANDLE (MISCELLANEOUS) ×4 IMPLANT
COVER TIP SHEARS 8 DVNC (MISCELLANEOUS) ×2 IMPLANT
COVER TIP SHEARS 8MM DA VINCI (MISCELLANEOUS) ×1
DEVICE TROCAR PUNCTURE CLOSURE (ENDOMECHANICALS) IMPLANT
DRAIN CHANNEL 19F RND (DRAIN) IMPLANT
DRAPE ARM DVNC X/XI (DISPOSABLE) ×8 IMPLANT
DRAPE COLUMN DVNC XI (DISPOSABLE) ×2 IMPLANT
DRAPE DA VINCI XI ARM (DISPOSABLE) ×4
DRAPE DA VINCI XI COLUMN (DISPOSABLE) ×1
DRAPE SURG IRRIG POUCH 19X23 (DRAPES) ×2 IMPLANT
DRSG OPSITE POSTOP 4X10 (GAUZE/BANDAGES/DRESSINGS) IMPLANT
DRSG OPSITE POSTOP 4X6 (GAUZE/BANDAGES/DRESSINGS) IMPLANT
DRSG OPSITE POSTOP 4X8 (GAUZE/BANDAGES/DRESSINGS) IMPLANT
DRSG TEGADERM 2-3/8X2-3/4 SM (GAUZE/BANDAGES/DRESSINGS) ×10 IMPLANT
DRSG TEGADERM 4X4.75 (GAUZE/BANDAGES/DRESSINGS) IMPLANT
ELECT PENCIL ROCKER SW 15FT (MISCELLANEOUS) ×2 IMPLANT
ELECT REM PT RETURN 15FT ADLT (MISCELLANEOUS) ×2 IMPLANT
ENDOLOOP SUT PDS II  0 18 (SUTURE)
ENDOLOOP SUT PDS II 0 18 (SUTURE) IMPLANT
EVACUATOR SILICONE 100CC (DRAIN) IMPLANT
GAUZE SPONGE 2X2 8PLY STRL LF (GAUZE/BANDAGES/DRESSINGS) ×2 IMPLANT
GLOVE ECLIPSE 8.0 STRL XLNG CF (GLOVE) ×6 IMPLANT
GLOVE INDICATOR 8.0 STRL GRN (GLOVE) ×6 IMPLANT
GOWN SRG XL LVL 4 BRTHBL STRL (GOWNS) ×2 IMPLANT
GOWN STRL NON-REIN XL LVL4 (GOWNS) ×1
GOWN STRL REUS W/ TWL XL LVL3 (GOWN DISPOSABLE) ×8 IMPLANT
GOWN STRL REUS W/TWL XL LVL3 (GOWN DISPOSABLE) ×4
GRASPER SUT TROCAR 14GX15 (MISCELLANEOUS) IMPLANT
HOLDER FOLEY CATH W/STRAP (MISCELLANEOUS) ×2 IMPLANT
IRRIG SUCT STRYKERFLOW 2 WTIP (MISCELLANEOUS) ×1
IRRIGATION SUCT STRKRFLW 2 WTP (MISCELLANEOUS) ×2 IMPLANT
KIT PROCEDURE DA VINCI SI (MISCELLANEOUS)
KIT PROCEDURE DVNC SI (MISCELLANEOUS) IMPLANT
KIT SIGMOIDOSCOPE (SET/KITS/TRAYS/PACK) IMPLANT
KIT TURNOVER KIT A (KITS) IMPLANT
NDL INSUFFLATION 14GA 120MM (NEEDLE) ×2 IMPLANT
NEEDLE INSUFFLATION 14GA 120MM (NEEDLE) ×1 IMPLANT
PACK CARDIOVASCULAR III (CUSTOM PROCEDURE TRAY) ×2 IMPLANT
PACK COLON (CUSTOM PROCEDURE TRAY) ×2 IMPLANT
PAD POSITIONING PINK XL (MISCELLANEOUS) ×2 IMPLANT
PROTECTOR NERVE ULNAR (MISCELLANEOUS) ×4 IMPLANT
RELOAD GRN CONTOUR (ENDOMECHANICALS) ×1 IMPLANT
RELOAD STAPLE 40 GRN THCK (ENDOMECHANICALS) IMPLANT
RELOAD STAPLE 45 3.5 BLU DVNC (STAPLE) IMPLANT
RELOAD STAPLE 45 4.3 GRN DVNC (STAPLE) IMPLANT
RELOAD STAPLE 60 3.5 BLU DVNC (STAPLE) IMPLANT
RELOAD STAPLE 60 4.3 GRN DVNC (STAPLE) IMPLANT
RELOAD STAPLER 3.5X45 BLU DVNC (STAPLE) IMPLANT
RELOAD STAPLER 3.5X60 BLU DVNC (STAPLE) IMPLANT
RELOAD STAPLER 4.3X45 GRN DVNC (STAPLE) IMPLANT
RELOAD STAPLER 4.3X60 GRN DVNC (STAPLE) IMPLANT
RETRACTOR WND ALEXIS 18 MED (MISCELLANEOUS) IMPLANT
RTRCTR WOUND ALEXIS 18CM MED (MISCELLANEOUS)
SCISSORS LAP 5X35 DISP (ENDOMECHANICALS) ×2 IMPLANT
SEAL CANN UNIV 5-8 DVNC XI (MISCELLANEOUS) ×6 IMPLANT
SEAL XI 5MM-8MM UNIVERSAL (MISCELLANEOUS) ×3
SEALER VESSEL DA VINCI XI (MISCELLANEOUS) ×1
SEALER VESSEL EXT DVNC XI (MISCELLANEOUS) ×2 IMPLANT
SOLUTION ELECTROLUBE (MISCELLANEOUS) IMPLANT
SPIKE FLUID TRANSFER (MISCELLANEOUS) ×2 IMPLANT
STAPLER 45 DA VINCI SURE FORM (STAPLE)
STAPLER 45 SUREFORM DVNC (STAPLE) IMPLANT
STAPLER 60 DA VINCI SURE FORM (STAPLE)
STAPLER 60 SUREFORM DVNC (STAPLE) IMPLANT
STAPLER CANNULA SEAL DVNC XI (STAPLE) ×2 IMPLANT
STAPLER CANNULA SEAL XI (STAPLE) ×1
STAPLER CVD CUT GN 40 RELOAD (ENDOMECHANICALS) ×1 IMPLANT
STAPLER CVD CUT GRN 40 RELOAD (ENDOMECHANICALS) IMPLANT
STAPLER ECHELON POWER CIR 29 (STAPLE) IMPLANT
STAPLER ECHELON POWER CIR 31 (STAPLE) IMPLANT
STAPLER RELOAD 3.5X45 BLU DVNC (STAPLE)
STAPLER RELOAD 3.5X45 BLUE (STAPLE)
STAPLER RELOAD 3.5X60 BLU DVNC (STAPLE)
STAPLER RELOAD 3.5X60 BLUE (STAPLE)
STAPLER RELOAD 4.3X45 GREEN (STAPLE)
STAPLER RELOAD 4.3X45 GRN DVNC (STAPLE)
STAPLER RELOAD 4.3X60 GREEN (STAPLE)
STAPLER RELOAD 4.3X60 GRN DVNC (STAPLE)
STOPCOCK 4 WAY LG BORE MALE ST (IV SETS) ×4 IMPLANT
SURGILUBE 2OZ TUBE FLIPTOP (MISCELLANEOUS) IMPLANT
SUT MNCRL AB 4-0 PS2 18 (SUTURE) ×2 IMPLANT
SUT PDS AB 1 CT1 27 (SUTURE) ×4 IMPLANT
SUT PROLENE 0 CT 2 (SUTURE) IMPLANT
SUT PROLENE 2 0 KS (SUTURE) IMPLANT
SUT PROLENE 2 0 SH DA (SUTURE) IMPLANT
SUT SILK 2 0 (SUTURE)
SUT SILK 2 0 SH CR/8 (SUTURE) IMPLANT
SUT SILK 2-0 18XBRD TIE 12 (SUTURE) IMPLANT
SUT SILK 3 0 (SUTURE)
SUT SILK 3 0 SH CR/8 (SUTURE) ×2 IMPLANT
SUT SILK 3-0 18XBRD TIE 12 (SUTURE) IMPLANT
SUT V-LOC BARB 180 2/0GR6 GS22 (SUTURE)
SUT VIC AB 3-0 SH 18 (SUTURE) IMPLANT
SUT VIC AB 3-0 SH 27 (SUTURE)
SUT VIC AB 3-0 SH 27XBRD (SUTURE) IMPLANT
SUT VICRYL 0 UR6 27IN ABS (SUTURE) ×2 IMPLANT
SUTURE V-LC BRB 180 2/0GR6GS22 (SUTURE) IMPLANT
SYR 20ML ECCENTRIC (SYRINGE) ×2 IMPLANT
SYS LAPSCP GELPORT 120MM (MISCELLANEOUS)
SYS WOUND ALEXIS 18CM MED (MISCELLANEOUS) ×1
SYSTEM LAPSCP GELPORT 120MM (MISCELLANEOUS) IMPLANT
SYSTEM WOUND ALEXIS 18CM MED (MISCELLANEOUS) ×2 IMPLANT
TAPE UMBILICAL 1/8 X36 TWILL (MISCELLANEOUS) IMPLANT
TOWEL OR NON WOVEN STRL DISP B (DISPOSABLE) ×2 IMPLANT
TRAY FOLEY MTR SLVR 16FR STAT (SET/KITS/TRAYS/PACK) ×2 IMPLANT
TROCAR ADV FIXATION 5X100MM (TROCAR) ×2 IMPLANT
TUBING CONNECTING 10 (TUBING) ×4 IMPLANT
TUBING INSUFFLATION 10FT LAP (TUBING) ×2 IMPLANT

## 2022-03-31 NOTE — Op Note (Signed)
03/31/2022  3:37 PM  PATIENT:  Alexandria [name]  76 y.o. 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  PROCEDURE:   ROBOTIC LOWER ANTERIOR RECTOSIGMOID RESECTION WITH COLOSTOMY DRAINAGE OF PELVIC ABSCESS TRANSVERSUS ABDOMINIS PLANE (TAP) BLOCK - BILATERAL RIGID PROCTOSCOPY  SURGEON:  Adin Hector, MD  ASSISTANT: Obie Dredge, PA-C    ANESTHESIA:     General  Regional TRANSVERSUS ABDOMINIS PLANE (TAP) nerve block for perioperative & postoperative pain control provided with liposomal bupivacaine (Experel) mixed with 0.25% bupivacaine as a Bilateral TAP block x 42m each side at the level of the transverse abdominis & preperitoneal spaces along the flank at the anterior axillary line, from subcostal ridge to iliac crest under laparoscopic guidance   Local field block at port sites & extraction wound  EBL:  Total I/O In: 2100 [I.V.:2000; IV Piggyback:100] Out: 400 [Urine:300; Blood:100]  Delay start of Pharmacological VTE agent (>24hrs) due to surgical blood loss or risk of bleeding:  no  DRAINS: 19 Fr Blake drain goes to the pelvis  SPECIMEN:   RECTOSIGMOID COLON (silk stitch proximal)  DISPOSITION OF SPECIMEN:  PATHOLOGY  COUNTS:  YES  PLAN OF CARE: Admit to inpatient   PATIENT DISPOSITION:  PACU - hemodynamically stable.  INDICATION:    77year old woman struggle with crampy abdominal pain and known sigmoid stricture quite some time.  Has seen outside gastrologist more recently about gastrology.  Narrow  kinked area with inflammation.  Concern from today but 2 endoscopies and biopsies been negative for that.  Came in with worsening abdominal pain.  CT scan showing abscess with rectosigmoid colon wrapped around it.  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  Placed on bowel rest and IV antibiotics without improvement after 48 hours.  Abscess not amenable to IR drainage.  Persistent pain and obstructive symptoms.  I recommended segmental resection and probable colostomy.  Noted I would try robotic minimally based approach with possible conversion to open.    The anatomy & physiology of the digestive tract was discussed.  The pathophysiology was discussed.  Natural history risks without surgery was discussed.   I worked to give an overview of the disease and the frequent need to have multispecialty involvement.  I feel the risks of no intervention will lead to serious problems that outweigh the operative risks; therefore, I recommended a partial colectomy to remove the pathology.  Laparoscopic & open techniques were discussed.   Risks such as bleeding, infection, abscess, leak, reoperation, possible ostomy, hernia, heart attack, death, and other risks were discussed.  I noted a good likelihood this will help address the problem.   Goals of post-operative recovery were discussed as well.  We will work to minimize complications.  Educational materials on the pathology had been given in the office.  Questions were answered.    The patient expressed understanding & wished to proceed with surgery.  OR FINDINGS:   Patient had very twisted rectosigmoid colon spiraled like a corkscrew filling the pelvis.  Quite kinked and folded upon itself.  At the epicenter of the folded  rectosigmoid mesentery was an obvious abscess with contained perforation and some stool.  Rectum itself did not seem particularly inflamed.  No definite endoluminal mass  up to the untwisted tumor but thickened and abnormal.  No obvious metastatic disease on visceral parietal peritoneum or liver.  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  Rectal stump to 11 cm with suture.  CASE DATA:  Type of patient?: LDOW CASE (Surgical Hospitalist [facility] Inpatient)  Status of Case? URGENT Add On  Infection Present At Time Of Surgery (PATOS)?  ABSCESS  DESCRIPTION:   Informed consent was confirmed.  The patient underwent general anaesthesia without difficulty.  The patient was positioned appropriately.  VTE prevention in place.  The patient was clipped, prepped, & draped in a sterile fashion.  Surgical timeout confirmed our plan.  The patient was positioned in reverse Trendelenburg.  Abdominal entry was gained using Varess technique at the left subcostal ridge on the anterior abdominal wall.  No elevated EtCO2 noted.  Port placed.  Camera inspection revealed no injury.  Extra ports were carefully placed under direct laparoscopic visualization.  Upon entering the abdomen (organ space), I encountered a phlegmon involving the distal colon spiraled and twisted in the pelvis. .   I reflected the greater omentum and the upper abdomen the small bowel in the upper abdomen.  The patient was carefully positioned.  The [employer] daVinci robot was docked with camera & instruments carefully placed.  The patient had extremely thickened phlegmonous colon twisted in the pelvis.  Work to mobilize the descending and sigmoid colon a lateral medial fashion.  I cannot mobilize the colon and bowel out of the pelvis since it was clearly adherent.  Elevated the rectosigmoid mesentery but was densely sloughed down.  Was able to come and find a window between the left colic and superior hemorrhoidal artery.  Use that to see the retroperitoneum on the left side.  Good isolate and see the left ureter in its native position.  Freed the rectal mesentery off the  ureter as it went down into the lateral left pelvis carefully.  Right ureter could be seen in the right retroperitoneal region far away from the phlegmon.  We were able to identify the left ureter and gonadal vessels. We kept those posterior within the retroperitoneum and elevated the left colon mesentery off that.  I did isolate the inferior mesenteric artery (IMA) pedicle but did not ligate it yet.  I continued distally and got into the avascular plane posterior to the mesorectum, sparing the nervi ergentes..  It was somewhat challenging.  Eventually lifted and transected through the inferior mesenteric artery pedicle and superior hemorrhoidal pedicle and later than inferior mesenteric vein after getting good critical view and making sure the ureters were down and away.  That allowed some morbility.  Without I could lift up the rectosigmoid mesentery and followed over the sacral promontory to the posterior pelvis.  There is some edema and inflammation but no definite abscess there.  I did rigid proctoscopy to confirm that the rectum was okay to about 11 cm.  Removed some stool balls.  I was going into the right pelvis and then it seemed to kink over to the left pelvis and then spiral back up.  Eventually freed off the rectosigmoid mesentery from its lateral pelvic sidewall attachments on the right and left lateral aspects.  Encountered an abscess cavity that aspirated that had liquid stool noted as well consistent with contained perforation  with abscess.  Without decompression conventionally straighten out the corkscrew inflamed rectosigmoid colon.  Eventually able to get it out of the pelvis to come down to the mid and distal rectum posteriorly and eventually laterally.  Freed off the vaginal cuff off the anterior rectal wall at the peritoneal reflection.  With that I could better straighten out the rectum.  Did dilators and rigid proctoscopy confirmed that rectum was uninflamed not involved to about 12 cm.     Went back and mobilized some of the left colon in a medial lateral fashion off the retroperitoneum.  Held off on being too aggressive to leave the left upper retroperitoneal planes alone.  Then shows a region at the descending sigmoid junction and transected through the mesentery in a radial fashion.  Allowed good mobility and length.  I then returned to mesorectal dissection.  Shows an area at the mid rectum that was distal to the massive corkscrew rectosigmoid phlegmon.  Dissected circumferentially and confirmed by rigid proctoscopy I was below the area of concern.  We created an extraction incision through a small Pfannenstiel incision in the suprapubic region.  Placed a wound protector.  I was able to eviscerate the rectosigmoid and descending colon out the wound.   I found the dissected mid rectum that was distal to the phlegmon.  Mass felt more inflammatory like diverticulitis.  There is no nodularity or ulceration suspicious for malignancy.  No carcinomatosis.  Transected at the mid rectum using a green load contour stapler x 1 firing to good result.  Placed 2-0 Prolene interrupted suture at left lateral and right lateral corners of the rectal stump with long 6 cm tail.  Found the region in the descending/sigmoid junction where I had pre-transected the mesentery and transected there with another firing of the contour stapler.  Descending colon looked nice and viable.  Specimen sent off.  We did massive copious irrigation of over 7 L of saline.  Ensured there was good hemostasis in the retroperitoneum.  Confirmed that right and left ureter were not injured and not involved.  Vaginal cuff and bladder not involved.   Tissues looked viable.  Ureters & bowel uninjured.  Given the chronic inflammation with abscess and stool did place a Blake drain down in the pelvis.  Was a greater omentum off the anterior abdominal wall and left upper quadrant to get enough mobility to reach down the pelvis.  It was  somewhat foreshortened but her transverse colon reached down to the suprapubic region which helped.  Did tha to help fill the pelvis.  Protect the rectal cuff.    We changed gloves & redraped the patient per colon SSI prevention protocol.  Sterile unused instruments were used from this point.  A circular defect in the skin of the left lower quadrant at the premarked colostomy site.  Removed a cylinder of fat came through the anterior rectus fascia transversely.  Split through the rectus muscle and posterior rectus fascia vertically dilated to 2 fingers.  Bring the end of the colostomy up to good result. .  Wound irrigated.  We closed the posterior rectus fascia with 0 Vicryl suture.  Anterior rectus fascia was closed using #1 PDS transversely. Skin edges trimmed to remove excess scarring and have healthy edges.  We closed the skin at the distal incision and port sites using Monocryl stitch.  It had been secured with Prolene suture. Sterile dressing placed.   Checked the dressings and matured the colostomy using 3-0 Vicryl interrupted suture  starting in 4 quadrants and then mucosal dermal bites to have a 1 cm per colostomy that passed well without twisting or torsion.  Appliance placed.  Patient is being extubated go to recovery room. I had discussed postop care with the patient in detail the office & in the holding area. Instructions are written. I discussed operative findings, updated the patient's status, discussed probable steps to recovery, and gave postoperative recommendations to the patient's neighbor & friend, Jetty Peeks .  Recommendations were made.  Questions were answered.  She expressed understanding & appreciation.  Adin Hector, M.D., F.A.C.S. Gastrointestinal and Minimally Invasive Surgery [facility] [address], [place], [place] [address] [phone] Main / Paging

## 2022-03-31 NOTE — Discharge Instructions (Addendum)
Drain Care: flush drain once daily with 5 mL sterile saline, record output and change dressing every 2-3 days   SURGERY: POST OP INSTRUCTIONS (Surgery for small bowel obstruction, colon resection, etc)   ######################################################################  EAT Gradually transition to a high fiber diet with a fiber supplement over the next few days after discharge  WALK Walk an hour a day.  Control your pain to do that.    CONTROL PAIN Control pain so that you can walk, sleep, tolerate sneezing/coughing, go up/down stairs.  HAVE A BOWEL MOVEMENT DAILY Keep your bowels regular to avoid problems.  OK to try a laxative to override constipation.  OK to use an antidairrheal to slow down diarrhea.  Call if not better after 2 tries  CALL IF YOU HAVE PROBLEMS/CONCERNS Call if you are still struggling despite following these instructions. Call if you have concerns not answered by these instructions  ######################################################################   DIET Follow a light diet the first few days at home.  Start with a bland diet such as soups, liquids, starchy foods, low fat foods, etc.  If you feel full, bloated, or constipated, stay on a ful liquid or pureed/blenderized diet for a few days until you feel better and no longer constipated. Be sure to drink plenty of fluids every day to avoid getting dehydrated (feeling dizzy, not urinating, etc.). Gradually add a fiber supplement to your diet over the next week.  Gradually get back to a regular solid diet.  Avoid fast food or heavy meals the first week as you are more likely to get nauseated. It is expected for your digestive tract to need a few months to get back to normal.  It is common for your bowel movements and stools to be irregular.  You will have occasional bloating and cramping that should eventually fade away.  Until you are eating solid food normally, off all pain medications, and back to regular  activities; your bowels will not be normal. Focus on eating a low-fat, high fiber diet the rest of your life (See Getting to [place], below).  CARE of your INCISION or WOUND  It is good for closed incisions and even open wounds to be washed every day.  Shower every day.  Short baths are fine.  Wash the incisions and wounds clean with soap & water.    You may leave closed incisions open to air if it is dry.   You may cover the incision with clean gauze & replace it after your daily shower for comfort.  TEGADERM & WICKS:  You have clear gauze band-aid dressings over your closed incision(s).  Remove the dressings & shoelace ribbon wicks in your largest incision 3 days after surgery.= 12/29 Friday    If you have an open wound with a wound vac, see wound vac care instructions.    ACTIVITIES as tolerated Start light daily activities --- self-care, walking, climbing stairs-- beginning the day after surgery.  Gradually increase activities as tolerated.  Control your pain to be active.  Stop when you are tired.  Ideally, walk several times a day, eventually an hour a day.   Most people are back to most day-to-day activities in a few weeks.  It takes 4-8 weeks to get back to unrestricted, intense activity. If you can walk 30 minutes without difficulty, it is safe to try more intense activity such as jogging, treadmill, bicycling, low-impact aerobics, swimming, etc. Save the most intensive and strenuous activity for last (Usually 4-8 weeks after  surgery) such as sit-ups, heavy lifting, contact sports, etc.  Refrain from any intense heavy lifting or straining until you are off narcotics for pain control.  You will have off days, but things should improve week-by-week. DO NOT PUSH THROUGH PAIN.  Let pain be your guide: If it hurts to do something, don't do it.  Pain is your body warning you to avoid that activity for another week until the pain goes down. You may drive when you are no longer  taking narcotic prescription pain medication, you can comfortably wear a seatbelt, and you can safely make sudden turns/stops to protect yourself without hesitating due to pain. You may have sexual intercourse when it is comfortable. If it hurts to do something, stop.  MEDICATIONS Take your usually prescribed home medications unless otherwise directed.   Blood thinners:  Usually you can restart any strong blood thinners after the second postoperative day.  It is OK to take aspirin right away.     If you are on strong blood thinners (warfarin/Coumadin, Plavix, Xerelto, Eliquis, Pradaxa, etc), discuss with your surgeon, medicine PCP, and/or cardiologist for instructions on when to restart the blood thinner & if blood monitoring is needed (PT/INR blood check, etc).     PAIN CONTROL Pain after surgery or related to activity is often due to strain/injury to muscle, tendon, nerves and/or incisions.  This pain is usually short-term and will improve in a few months.  To help speed the process of healing and to get back to regular activity more quickly, DO THE FOLLOWING THINGS TOGETHER: Increase activity gradually.  DO NOT PUSH THROUGH PAIN Use Ice and/or Heat Try Gentle Massage and/or Stretching Take over the counter pain medication Take Narcotic prescription pain medication for more severe pain  Good pain control = faster recovery.  It is better to take more medicine to be more active than to stay in bed all day to avoid medications.  Increase activity gradually Avoid heavy lifting at first, then increase to lifting as tolerated over the next 6 weeks. Do not "push through" the pain.  Listen to your body and avoid positions and maneuvers than reproduce the pain.  Wait a few days before trying something more intense Walking an hour a day is encouraged to help your body recover faster and more safely.  Start slowly and stop when getting sore.  If you can walk 30 minutes without stopping or pain, you can  try more intense activity (running, jogging, aerobics, cycling, swimming, treadmill, sex, sports, weightlifting, etc.) Remember: If it hurts to do it, then don't do it! Use Ice and/or Heat You will have swelling and bruising around the incisions.  This will take several weeks to resolve. Ice packs or heating pads (6-8 times a day, 30-60 minutes at a time) will help sooth soreness & bruising. Some people prefer to use ice alone, heat alone, or alternate between ice & heat.  Experiment and see what works best for you.  Consider trying ice for the first few days to help decrease swelling and bruising; then, switch to heat to help relax sore spots and speed recovery. Shower every day.  Short baths are fine.  It feels good!  Keep the incisions and wounds clean with soap & water.   Try Gentle Massage and/or Stretching Massage at the area of pain many times a day Stop if you feel pain - do not overdo it Take over the counter pain medication This helps the muscle and nerve tissues become less  irritable and calm down faster Choose ONE of the following over-the-counter anti-inflammatory medications: Acetaminophen 544m tabs (Tylenol) 1-2 pills with every meal and just before bedtime (avoid if you have liver problems or if you have acetaminophen in you narcotic prescription) Naproxen 2235mtabs (ex. Aleve, Naprosyn) 1-2 pills twice a day (avoid if you have kidney, stomach, IBD, or bleeding problems) Ibuprofen 20052mabs (ex. Advil, Motrin) 3-4 pills with every meal and just before bedtime (avoid if you have kidney, stomach, IBD, or bleeding problems) Take with food/snack several times a day as directed for at least 2 weeks to help keep pain / soreness down & more manageable. Take Narcotic prescription pain medication for more severe pain A prescription for strong pain control is often given to you upon discharge (for example: oxycodone/Percocet, hydrocodone/Norco/Vicodin, or tramadol/Ultram) Take your pain  medication as prescribed. Be mindful that most narcotic prescriptions contain Tylenol (acetaminophen) as well - avoid taking too much Tylenol. If you are having problems/concerns with the prescription medicine (does not control pain, nausea, vomiting, rash, itching, etc.), please call us [phone] see if we need to switch you to a different pain medicine that will work better for you and/or control your side effects better. If you need a refill on your pain medication, you must call the office before 4 pm and on weekdays only.  By federal law, prescriptions for narcotics cannot be called into a pharmacy.  They must be filled out on paper & picked up from our office by the patient or authorized caretaker.  Prescriptions cannot be filled after 4 pm nor on weekends.    WHEN TO CALL US [phone] uncontrolled or worsening pain  Fever over 101 F (38.5 C) Concerns with the incision: Worsening pain, redness, rash/hives, swelling, bleeding, or drainage Reactions / problems with new medications (itching, rash, hives, nausea, etc.) Nausea and/or vomiting Difficulty urinating Difficulty breathing Worsening fatigue, dizziness, lightheadedness, blurred vision Other concerns If you are not getting better after two weeks or are noticing you are getting worse, contact our office [phone] for further advice.  We may need to adjust your medications, re-evaluate you in the office, send you to the emergency room, or see what other things we can do to help. The clinic staff is available to answer your questions during regular business hours (8:30am-5pm).  Please don't hesitate to call and ask to speak to one of our nurses for clinical concerns.    A surgeon from [facility] is always on call at the hospitals 24 hours/day If you have a medical emergency, go to the nearest emergency room or call 911.  FOLLOW UP in our office One the day of your discharge from the hospital (or the  next business weekday), please call [facility] to set up or confirm an appointment to see your surgeon in the office for a follow-up appointment.  Usually it is 2-3 weeks after your surgery.   If you have skin staples at your incision(s), let the office know so we can set up a time in the office for the nurse to remove them (usually around 10 days after surgery). Make sure that you call for appointments the day of discharge (or the next business weekday) from the hospital to ensure a convenient appointment time. IF YOU HAVE DISABILITY OR FAMILY LEAVE FORMS, BRING THEM TO THE OFFICE FOR PROCESSING.  DO NOT GIVE THEM TO YOUR DOCTOR.  [facility]  [address], [place], [place]  [address] ? [phone] - Main [phone] - Toll Free,  [phone] - Fax [email or web address]    GETTING TO GOOD BOWEL HEALTH. It is expected for your digestive tract to need a few months to get back to normal.  It is common for your bowel movements and stools to be irregular.  You will have occasional bloating and cramping that should eventually fade away.  Until you are eating solid food normally, off all pain medications, and back to regular activities; your bowels will not be normal.   Avoiding constipation The goal: ONE SOFT BOWEL MOVEMENT A DAY!    Drink plenty of fluids.  Choose water first. TAKE A FIBER SUPPLEMENT EVERY DAY THE REST OF YOUR LIFE During your first week back home, gradually add back a fiber supplement every day Experiment which form you can tolerate.   There are many forms such as powders, tablets, wafers, gummies, etc Psyllium bran (Metamucil), methylcellulose (Citrucel), Miralax or Glycolax, Benefiber, Flax Seed.  Adjust the dose week-by-week (1/2 dose/day to 6 doses a day) until you are moving your bowels 1-2 times a day.  Cut back the dose or try a different fiber product if it is giving you problems such as diarrhea or  bloating. Sometimes a laxative is needed to help jump-start bowels if constipated until the fiber supplement can help regulate your bowels.  If you are tolerating eating & you are farting, it is okay to try a gentle laxative such as double dose MiraLax, prune juice, or Milk of Magnesia.  Avoid using laxatives too often. Stool softeners can sometimes help counteract the constipating effects of narcotic pain medicines.  It can also cause diarrhea, so avoid using for too long. If you are still constipated despite taking fiber daily, eating solids, and a few doses of laxatives, call our office. Controlling diarrhea Try drinking liquids and eating bland foods for a few days to avoid stressing your intestines further. Avoid dairy products (especially milk & ice cream) for a short time.  The intestines often can lose the ability to digest lactose when stressed. Avoid foods that cause gassiness or bloating.  Typical foods include beans and other legumes, cabbage, broccoli, and dairy foods.  Avoid greasy, spicy, fast foods.  Every person has some sensitivity to other foods, so listen to your body and avoid those foods that trigger problems for you. Probiotics (such as active yogurt, Align, etc) may help repopulate the intestines and colon with normal bacteria and calm down a sensitive digestive tract Adding a fiber supplement gradually can help thicken stools by absorbing excess fluid and retrain the intestines to act more normally.  Slowly increase the dose over a few weeks.  Too much fiber too soon can backfire and cause cramping & bloating. It is okay to try and slow down diarrhea with a few doses of antidiarrheal medicines.   Bismuth subsalicylate (ex. Kayopectate, Pepto Bismol) for a few doses can help control diarrhea.  Avoid if pregnant.   Loperamide (Imodium) can slow down diarrhea.  Start with one tablet (89m) first.  Avoid if you are having fevers or severe pain.  ILEOSTOMY PATIENTS WILL HAVE CHRONIC  DIARRHEA since their colon is not in use.    Drink plenty of liquids.  You will need to drink even more glasses of water/liquid a day to avoid getting dehydrated. Record output from your ileostomy.  Expect to empty the bag every 3-4 hours at first.  Most people with a permanent ileostomy  empty their bag 4-6 times at the least.   Use antidiarrheal medicine (especially Imodium) several times a day to avoid getting dehydrated.  Start with a dose at bedtime & breakfast.  Adjust up or down as needed.  Increase antidiarrheal medications as directed to avoid emptying the bag more than 8 times a day (every 3 hours). Work with your wound ostomy nurse to learn care for your ostomy.  See ostomy care instructions. TROUBLESHOOTING IRREGULAR BOWELS 1) Start with a soft & bland diet. No spicy, greasy, or fried foods.  2) Avoid gluten/wheat or dairy products from diet to see if symptoms improve. 3) Miralax 17gm or flax seed mixed in [place]. water or juice-daily. May use 2-4 times a day as needed. 4) Gas-X, Phazyme, etc. as needed for gas & bloating.  5) Prilosec (omeprazole) over-the-counter as needed 6)  Consider probiotics (Align, Activa, etc) to help calm the bowels down  Call your doctor if you are getting worse or not getting better.  Sometimes further testing (cultures, endoscopy, X-ray studies, CT scans, bloodwork, etc.) may be needed to help diagnose and treat the cause of the diarrhea. [facility], [place], [place], [place], [place]  [address] [phone] - Main.    [phone]  - Toll Free.   [phone] - Fax [email or web address]    ###################  #######################################################  Ostomy Support Information  You've heard that people get along just fine with only one of their eyes, or one of their lungs, or one of their kidneys. But you also know that you have only one intestine and only one bladder, and that leaves you  feeling awfully empty, both physically and emotionally: You think no other people go around without part of their intestine with the ends of their intestines sticking out through their abdominal walls.   YOU ARE NOT ALONE.  There are nearly three quarters of a million people in the [place] who have an ostomy; people who have had surgery to remove all or part of their colons or bladders.   There is even a national association, the [place] [employer] [place] with over 350 local affiliated support groups that are organized by volunteers who provide peer support and counseling. [place] has a toll free telephone num-ber, [phone] and an educational, interactive website, www.ostomy.org   An ostomy is an opening in the belly (abdominal wall) made by surgery. Ostomates are people who have had this procedure. The opening (stoma) allows the kidney or bowel to grdischarge waste. An external pouch covers the stoma to collect waste. Pouches are are a simple bag and are odor free. Different companies have disposable or reusable pouches to fit one's lifestyle. An ostomy can either be temporary or permanent.   THERE ARE THREE MAIN TYPES OF OSTOMIES Colostomy. A colostomy is a surgically created opening in the large intestine (colon). Ileostomy. An ileostomy is a surgically created opening in the small intestine. Urostomy. A urostomy is a surgically created opening to divert urine away from the bladder.  OSTOMY Care  The following guidelines will make care of your colostomy easier. Keep this information close by for quick reference.  Helpful DIET hints Eat a well-balanced diet including vegetables and fresh fruits. Eat on a regular schedule.  Drink at least 6 to 8 glasses of fluids daily. Eat slowly in a relaxed atmosphere. Chew your food thoroughly. Avoid chewing gum, smoking, and drinking from a straw. This will help decrease the amount of air you swallow, which may  help reduce gas. Eating yogurt or  drinking buttermilk may help reduce gas.  To control gas at night, do not eat after 8 p.m. This will give your bowel time to quiet down before you go to bed.  If gas is a problem, you can purchase Beano. Sprinkle Beano on the first bite of food before eating to reduce gas. It has no flavor and should not change the taste of your food. You can buy Beano over the counter at your local drugstore.  Foods like fish, onions, garlic, broccoli, asparagus, and cabbage produce odor. Although your pouch is odor-proof, if you eat these foods you may notice a stronger odor when emptying your pouch. If this is a concern, you may want to limit these foods in your diet.  If you have an ileostomy, you will have chronic diarrhea & need to drink more liquids to avoid getting dehydrated.  Consider antidiarrheal medicine like imodium (loperamide) or Lomotil to help slow down bowel movements / diarrhea into your ileostomy bag.  GETTING TO GOOD BOWEL HEALTH WITH AN ILEOSTOMY    With the colon bypassed & not in use, you will have small bowel diarrhea.   It is important to thicken & slow your bowel movements down.   The goal: 4-6 small BOWEL MOVEMENTS A DAY It is important to drink plenty of liquids to avoid getting dehydrated  CONTROLLING ILEOSTOMY DIARRHEA  TAKE A FIBER SUPPLEMENT (FiberCon or Benefiner soluble fiber) twice a day - to thicken stools by absorbing excess fluid and retrain the intestines to act more normally.  Slowly increase the dose over a few weeks.  Too much fiber too soon can backfire and cause cramping & bloating.  TAKE AN IRON SUPPLEMENT twice a day to naturally constipate your bowels.  Usually ferrous sulfate 317m twice a day)  TAKE ANTI-DIARRHEAL MEDICINES: Loperamide (Imodium) can slow down diarrhea.  Start with two tablets (= 476m first and then try one tablet every 6 hours.  Can go up to 2 pills four times day (8 pills of 81m38max) Avoid if you are having fevers or severe pain.  If you  are not better or start feeling worse, stop all medicines and call your doctor for advice LoMotil (Diphenoxylate / Atropine) is another medicine that can constipate & slow down bowel moevements Pepto Bismol (bismuth) can gently thicken bowels as well  If diarrhea is worse,: drink plenty of liquids and try simpler foods for a few days to avoid stressing your intestines further. Avoid dairy products (especially milk & ice cream) for a short time.  The intestines often can lose the ability to digest lactose when stressed. Avoid foods that cause gassiness or bloating.  Typical foods include beans and other legumes, cabbage, broccoli, and dairy foods.  Every person has some sensitivity to other foods, so listen to our body and avoid those foods that trigger problems for you.Call your doctor if you are getting worse or not better.  Sometimes further testing (cultures, endoscopy, X-ray studies, bloodwork, etc) may be needed to help diagnose and treat the cause of the diarrhea. Take extra anti-diarrheal medicines (maximum is 8 pills of 81mg1mperamide a day)   Tips for POUCHING an OSTOMY   Changing Your Pouch The best time to change your pouch is in the morning, before eating or drinking anything. Your stoma can function at any time, but it will function more after eating or drinking.   Applying the pouching system  Place all your equipment close at  hand before removing your pouch.  Wash your hands.  Stand or sit in front of a mirror. Use the position that works best for you. Remember that you must keep the skin around the stoma wrinkle-free for a good seal.  Gently remove the used pouch (1-piece system) or the pouch and old wafer (2-piece system). Empty the pouch into the toilet. Save the closure clip to use again.  Wash the stoma itself and the skin around the stoma. Your stoma may bleed a little when being washed. This is normal. Rinse and pat dry. You may use a wash cloth or soft paper towels  (like Bounty), mild soap (like Dial, Safeguard, or [place]), and water. Avoid soaps that contain perfumes or lotions.  For a new pouch (1-piece system) or a new wafer (2-piece system), measure your stoma using the stoma guide in each box of supplies.  Trace the shape of your stoma onto the back of the new pouch or the back of the new wafer. Cut out the opening. Remove the paper backing and set it aside.  Optional: Apply a skin barrier powder to surrounding skin if it is irritated (bare or weeping), and dust off the excess. Optional: Apply a skin-prep wipe (such as Skin Prep or All-Kare) to the skin around the stoma, and let it dry. Do not apply this solution if the skin is irritated (red, tender, or broken) or if you have shaved around the stoma. Optional: Apply a skin barrier paste (such as Stomahesive, Coloplast, or Premium) around the opening cut in the back of the pouch or wafer. Allow it to dry for 30 to 60 seconds.  Hold the pouch (1-piece system) or wafer (2-piece system) with the sticky side toward your body. Make sure the skin around the stoma is wrinkle-free. Center the opening on the stoma, then press firmly to your abdomen (Fig. 4). Look in the mirror to check if you are placing the pouch, or wafer, in the right position. For a 2-piece system, snap the pouch onto the wafer. Make sure it snaps into place securely.  Place your hand over the stoma and the pouch or wafer for about 30 seconds. The heat from your hand can help the pouch or wafer stick to your skin.  Add deodorant (such as Super Banish or Nullo) to your pouch. Other options include food extracts such as vanilla oil and peppermint extract. Add about 10 drops of the deodorant to the pouch. Then apply the closure clamp. Note: Do not use toxic  chemicals or commercial cleaning agents in your pouch. These substances may harm the stoma.  Optional: For extra seal, apply tape to all 4 sides around the pouch or wafer, as if you were  framing a picture. You may use any brand of medical adhesive tape. Change your pouch every 5 to 7 days. Change it immediately if a leak occurs.  Wash your hands afterwards.  If you are wearing a 2-piece system, you may use 2 new pouches per week and alternate them. Rinse the pouch with mild soap and warm water and hang it to dry for the next day. Apply the fresh pouch. Alternate the 2 pouches like this for a week. After a week, change the wafer and begin with 2 new pouches. Place the old pouches in a plastic bag, and put them in the trash.   LIVING WITH AN OSTOMY  Emptying Your Pouch Empty your pouch when it is one-third full (of urine, stool, and/or gas). If  you wait until your pouch is fuller than this, it will be more difficult to empty and more noticeable. When you empty your pouch, either put toilet paper in the toilet bowl first, or flush the toilet while you empty the pouch. This will reduce splashing. You can empty the pouch between your legs or to one side while sitting, or while standing or stooping. If you have a 2-piece system, you can snap off the pouch to empty it. Remember that your stoma may function during this time. If you wish to rinse your pouch after you empty it, a [place] baster can be helpful. When using a baster, squirt water up into the pouch through the opening at the bottom. With a 2-piece system, you can snap off the pouch to rinse it. After rinsing  your pouch, empty it into the toilet. When rinsing your pouch at home, put a few granules of Dreft soap in the rinse water. This helps lubricate and freshen your pouch. The inside of your pouch can be sprayed with non-stick cooking oil (Pam spray). This may help reduce stool sticking to the inside of the pouch.  Bathing You may shower or bathe with your pouch on or off. Remember that your stoma may function during this time.  The materials you use to wash your stoma and the skin around it should be clean, but they do not  need to be sterile.  Wearing Your Pouch During hot weather, or if you perspire a lot in general, wear a cover over your pouch. This may prevent a rash on your skin under the pouch. Pouch covers are sold at ostomy supply stores. Wear the pouch inside your underwear for better support. Watch your weight. Any gain or loss of 10 to 15 pounds or more can change the way your pouch fits.  Going Away From Home A collapsible cup (like those that come in travel kits) or a soft plastic squirt bottle with a pull-up top (like a travel bottle for shampoo) can be used for rinsing your pouch when you are away from home. Tilt the opening of the pouch at an upward angle when using a cup to rinse.  Carry wet wipes or extra tissues to use in public bathrooms.  Carry an extra pouching system with you at all times.  Never keep ostomy supplies in the glove compartment of your car. Extreme heat or cold can damage the skin barriers and adhesive wafers on the pouch.  When you travel, carry your ostomy supplies with you at all times. Keep them within easy reach. Do not pack ostomy supplies in baggage that will be checked or otherwise separated from you, because your baggage might be lost. If you're traveling out of the country, it is helpful to have a letter stating that you are carrying ostomy supplies as a medical necessity.  If you need ostomy supplies while traveling, look in the yellow pages of the telephone book under "Surgical Supplies." Or call the local ostomy organization to find out where supplies are available.  Do not let your ostomy supplies get low. Always order new pouches before you use the last one.  Reducing Odor Limit foods such as broccoli, cabbage, onions, fish, and garlic in your diet to help reduce odor. Each time you empty your pouch, carefully clean the opening of the pouch, both inside and outside, with toilet paper. Rinse your pouch 1 or 2 times daily after you empty it (see directions for  emptying your pouch and  going away from home). Add deodorant (such as Super Banish or Nullo) to your pouch. Use air deodorizers in your bathroom. Do not add aspirin to your pouch. Even though aspirin can help prevent odor, it could cause ulcers on your stoma.  When to call the doctor Call the doctor if you have any of the following symptoms: Purple, black, or white stoma Severe cramps lasting more than 6 hours Severe watery discharge from the stoma lasting more than 6 hours No output from the colostomy for 3 days Excessive bleeding from your stoma Swelling of your stoma to more than 1/2-inch larger than usual Pulling inward of your stoma below skin level Severe skin irritation or deep ulcers Bulging or other changes in your abdomen  When to call your ostomy nurse Call your ostomy/enterostomal therapy (WOCN) nurse if any of the following occurs: Frequent leaking of your pouching system Change in size or appearance of your stoma, causing discomfort or problems with your pouch Skin rash or rawness Weight gain or loss that causes problems with your pouch     FREQUENTLY ASKED QUESTIONS   Why haven't you met any of these folks who have an ostomy?  Well, maybe you have! You just did not recognize them because an ostomy doesn't show. It can be kept secret if you wish. Why, maybe some of your best friends, office associates or neighbors have an ostomy ... you never can tell. People facing ostomy surgery have many quality-of-life questions like: Will you bulge? Smell? Make noises? Will you feel waste leaving your body? Will you be a captive of the toilet? Will you starve? Be a social outcast? Get/stay married? Have babies? Easily bathe, go swimming, bend over?  OK, let's look at what you can expect:   Will you bulge?  Remember, without part of the intestine or bladder, and its contents, you should have a flatter tummy than before. You can expect to wear, with little exception, what you wore  before surgery ... and this in-cludes tight clothing and bathing suits.   Will you smell?  Today, thanks to modern odor proof pouching systems, you can walk into an ostomy support group meeting and not smell anything that is foul or offensive. And, for those with an ileostomy or colostomy who are concerned about odor when emptying their pouch, there are in-pouch deodorants that can be used to eliminate any waste odors that may exist.   Will you make noises?  Everyone produces gas, especially if they are an air-swallower. But intestinal sounds that occur from time to time are no differ-ent than a gurgling tummy, and quite often your clothing will muffle any sounds.   Will you feel the waste discharges?  For those with a colostomy or ileostomy there might be a slight pressure when waste leaves your body, but understand that the intestines have no nerve endings, so there will be no unpleasant sensations. Those with a urostomy will probably be unaware of any kidney drainage.   Will you be a captive of the toilet?  Immediately post-op you will spend more time in the bathroom than you will after your body recovers from surgery. Every person is different, but on average those with an ileostomy or urostomy may empty their pouches 4 to 6 times a day; a little  less if you have a colostomy. The average wear time between pouch system changes is 3 to 5 days and the changing process should take less than 30 minutes.   Will I need  to be on a special diet? Most people return to their normal diet when they have recovered from surgery. Be sure to chew your food well, eat a well-balanced diet and drink plenty of fluids. If you experience problems with a certain food, wait a couple of weeks and try it again.  Will there be odor and noises? Pouching systems are designed to be odor-proof or odor-resistant. There are deodorants that can be used in the pouch. Medications are also available to help reduce odor. Limit  gas-producing foods and carbonated beverages. You will experience less gas and fewer noises as you heal from surgery.  How much time will it take to care for my ostomy? At first, you may spend a lot of time learning about your ostomy and how to take care of it. As you become more comfortable and skilled at changing the pouching system, it will take very little time to care for it.   Will I be able to return to work? People with ostomies can perform most jobs. As soon as you have healed from surgery, you should be able to return to work. Heavy lifting (more than 10 pounds) may be discouraged.   What about intimacy? Sexual relationships and intimacy are important and fulfilling aspects of your life. They should continue after ostomy surgery. Intimacy-related concerns should be discussed openly between you and your partner.   Can I wear regular clothing? You do not need to wear special clothing. Ostomy pouches are fairly flat and barely noticeable. Elastic undergarments will not hurt the stoma or prevent the ostomy from functioning.   Can I participate in sports? An ostomy should not limit your involvement in sports. Many people with ostomies are runners, skiers, swimmers or participate in other active lifestyles. Talk with your caregiver first before doing heavy physical activity.  Will you starve?  Not if you follow doctor's orders at each stage of your post-op adjustment. There is no such thing as an "ostomy diet". Some people with an ostomy will be able to eat and tolerate anything; others may find diffi-culty with some foods. Each person is an individual and must determine, by trial, what is best for them. A good practice for all is to drink plenty of water.   Will you be a social outcast?  Have you met anyone who has an ostomy and is a social outcast? Why should you be the first? Only your attitude and self image will effect how you are treated. No confi-dent person is an Occupational psychologist.     PROFESSIONAL HELP   Resources are available if you need help or have questions about your ostomy.   Specially trained nurses called Wound, Ostomy Continence Nurses (WOCN) are available for consultation in most major medical centers.  Consider getting an ostomy consult at an outpatient ostomy clinic.   [facility] has an [facility] run by an Programmer, systems at the [place].  [phone]. [facility] can help set up an appointment   The [employer] (UOA) is a group made up of many local chapters throughout the [place]. These local groups hold meetings and provide support to prospective and existing ostomates. They sponsor educational events and have qualified visitors to make personal or telephone visits. Contact the UOA for the chapter nearest you and for other educational publications.  More detailed information can be found in Colostomy Guide, a publication of the [employer] (UOA). Contact UOA at 1-9721177553 or visit their web  site at [email or web address] The website contains links to other sites, suppliers and resources.  [facility] Start Services: Start at the website to enlist for support.  Your Wound Ostomy (WOCN) nurse may have started this process. [email or web address]/securestart Secure Start services are designed to support people as they live their lives with an ostomy or neurogenic bladder. Enrolling is easy and at no cost to the patient. We realize that each person's needs and life journey are different. Through Secure Start services, we want to help people live their life, their way.  #######################################################

## 2022-03-31 NOTE — Consult Note (Signed)
[facility] Nurse ostomy consult note [facility] Nurse requested for preoperative stoma site marking by Dr. [name]  Discussed surgical procedure and stoma creation with patient.  Explained role of the [facility] nurse team.  Answered patient questions. Patient relays that mother mother had an ostomy.  Examined patient lying, sitting, and standing in order to place the marking in the patient's visual field, away from any creases or abdominal contour issues and within the rectus muscle.  Deep crease at umbilicus. Two sites are selected, patient can see both when standing.  Marked for colostomy in the LUQ  5cm to the left of the umbilicus and 3cm above the umbilicus.  Marked for colostomy in the LLQ  4 cm to the left of the umbilicus and  8.0DX below the umbilicus.  Patient's abdomen cleansed with CHG wipes at site markings, allowed to air dry prior to marking.Covered marks with thin film transparent dressing to preserve marks until date of surgery-today..   [facility] Nurse team will follow up with patient after surgery for continue ostomy care and teaching.   Thank you for inviting [place] to participate in this patient's Plan of Care.  Maudie Flakes, MSN, RN, CNS, [place], Serita Grammes, [employer], [employer] phone:  [phone]

## 2022-03-31 NOTE — Progress Notes (Signed)
PROGRESS NOTE    Alexandria [name]  WSF:[id number] DOB: November 02, 1945 DOA: 03/28/2022 PCP: [name], DO     Brief Narrative:  Alexandria [name] is a 76 y/o with a history of hypothyroidism, grade I DD, HTN, IPF, diverticulosis, diverticulitis, DVT, chronic hypokalemia who has a long history of constipation and has recently tried Linzess but stopped after 2 weeks due to lack of efficacy by her report. She recently got CT abd/pelvis 03/20/22 which revealed an enhancing lesion/mass just below the sigmoid-rectal junction. Flex Sig was performed 03/20/22 which revealed a polyupoid mass at the sigmoid-rectal junction. Bx was readout by pathology as an inflammatory poly w/o dysplasia or malignancy. Patient had MRI abd/pelvis 03/26/22 - reading pending. Due to continued constipation and diffuse abdominal pain, worst in the LUQ, she presented to [facility] for evaluation. General surgery was consulted. Patient underwent repeat CT abd/pelvis and thought to have complicated diverticulitis with abscess. Unable to drain per IR.   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 Distal sigmoid colon shows wall thickening and adjacent inflammation. There is an adjacent irregular fluid collection consistent with a peridiverticular abscess in the posteroinferior pelvis that  measures 6 x 2.6 x 2.6 cm. Inflammatory changes have become more evident compared to the prior CTs. No bowel obstruction. -General surgery following -Unable to drain abscess per IR. Continue zosyn  -OR today for Hartmann resection of rectosigmoid colon and colostomy   Chronic diastolic HF -Hold lasix, monitor fluid status to avoid overload    Hypothyroidism -Synthroid    History of idiopathic pulmonary fibrosis -Without exacerbation -Ofev    DVT prophylaxis:  enoxaparin (LOVENOX) injection 40 mg Start: 03/31/22 1045 SCD's Start: 03/31/22 1000 Place and maintain sequential compression device Start: 03/30/22 0940  Code Status: Full Family Communication: none at bedside  Disposition Plan:  Status is: Inpatient Remains inpatient appropriate because: Surgery today  Consultants:  Gen surg IR  Procedures:  None   Antimicrobials:  Anti-infectives (From admission, onward)    Start     Dose/Rate Route Frequency Ordered Stop   03/31/22 1045  cefoTEtan (CEFOTAN) 2 g in sodium chloride 0.9 % 100 mL IVPB        2 g 200 mL/hr over 30 Minutes Intravenous On call to O.R. 03/31/22 0959 04/01/22 0559   03/29/22 0800  piperacillin-tazobactam (ZOSYN) IVPB 3.375 g        3.375 g 12.5 mL/hr over 240 Minutes Intravenous Every 8 hours 03/29/22 0746 04/03/22 0800        Objective: Vitals:   03/30/22 0700 03/30/22 1248 03/30/22 2029 03/31/22 0550  BP: 137/77 133/66 137/65 134/65  Pulse: 75 65 72 63  Resp: '18 17 18 16  '$ Temp: 98.2 F (36.8 C) 98 F (36.7 C) 97.9 F (36.6 C) (!) 97.5 F (36.4 C)  TempSrc: Oral Oral Oral Oral  SpO2: 96% 96% 98% 95%  Weight:  Height:        Intake/Output Summary (Last 24 hours) at 03/31/2022 1045 Last data filed at 03/31/2022 1000 Gross per 24 hour  Intake 1072.08 ml  Output 2100 ml  Net -1027.92 ml    Filed Weights   03/28/22 1738  Weight: 63 kg    Examination:  General exam: Appears calm but uncomfortable  Respiratory system:  Clear to auscultation. Respiratory effort normal. No respiratory distress. No conversational dyspnea.  Cardiovascular system: S1 & S2 heard, RRR. No murmurs. No pedal edema. Gastrointestinal system: Abdomen is nondistended, soft, TTP  Central nervous system: Alert and oriented. No focal neurological deficits. Speech clear.  Extremities: Symmetric in appearance  Skin: No rashes, lesions or ulcers on exposed skin  Psychiatry: Judgement and insight appear normal. Mood & affect appropriate.   Data Reviewed: I have personally reviewed following labs and imaging studies  CBC: Recent Labs  Lab 03/28/22 1904 03/29/22 2355 03/31/22 0421  WBC 13.4* 9.8 6.8  NEUTROABS 9.7*  --   --   HGB 12.5 10.8* 10.0*  HCT 38.1 33.6* 31.2*  MCV 99.7 102.4* 101.6*  PLT 325 280 154    Basic Metabolic Panel: Recent Labs  Lab 03/28/22 1904 03/29/22 2355 03/31/22 0421  NA 132* 135 135  K 3.7 4.0 3.5  CL 95* 100 99  CO2 '30 26 27  '$ GLUCOSE 116* 100* 81  BUN '13 10 11  '$ CREATININE 0.84 0.94 1.01*  CALCIUM 8.6* 8.4* 8.1*  MG  --   --  2.3    GFR: Estimated Creatinine Clearance: 40.3 mL/min (A) (by C-G formula based on SCr of 1.01 mg/dL (H)). Liver Function Tests: Recent Labs  Lab 03/28/22 1904  AST 19  ALT 13  ALKPHOS 77  BILITOT 1.0  PROT 8.1  ALBUMIN 3.1*    Recent Labs  Lab 03/28/22 1904  LIPASE 25    No results for input(s): "AMMONIA" in the last 168 hours. Coagulation Profile: Recent Labs  Lab 03/30/22 0129  INR 1.2    Cardiac Enzymes: No results for input(s): "CKTOTAL", "CKMB", "CKMBINDEX", "TROPONINI" in the last 168 hours. BNP (last 3 results) No results for input(s): "PROBNP" in the last 8760 hours. HbA1C: No results for input(s): "HGBA1C" in the last 72 hours. CBG: Recent Labs  Lab 03/29/22 2355  GLUCAP 103*    Lipid Profile: No results for input(s): "CHOL", "HDL", "LDLCALC", "TRIG", "CHOLHDL", "LDLDIRECT" in the last 72 hours. Thyroid Function Tests: No  results for input(s): "TSH", "T4TOTAL", "FREET4", "T3FREE", "THYROIDAB" in the last 72 hours. Anemia Panel: No results for input(s): "VITAMINB12", "FOLATE", "FERRITIN", "TIBC", "IRON", "RETICCTPCT" in the last 72 hours. Sepsis Labs: No results for input(s): "PROCALCITON", "LATICACIDVEN" in the last 168 hours.  No results found for this or any previous visit (from the past 240 hour(s)).    Radiology Studies: CT ABDOMEN PELVIS W CONTRAST  Result Date: 03/29/2022 CLINICAL DATA:  Constipation. Clinical suspicion for diverticulitis. EXAM: CT ABDOMEN AND PELVIS WITH CONTRAST TECHNIQUE: Multidetector CT imaging of the abdomen and pelvis was performed using the standard protocol following bolus administration of intravenous contrast. RADIATION DOSE REDUCTION: This exam was performed according to the departmental dose-optimization program which includes automated exposure control, adjustment of the mA and/or kV according to patient size and/or use of iterative reconstruction technique. CONTRAST:  183m OMNIPAQUE IOHEXOL 300 MG/ML  SOLN COMPARISON:  03/20/2022.  03/13/2022. FINDINGS: Lower chest: Chronic fibrotic changes at the lung bases, stable. No acute findings. Hepatobiliary: No focal liver abnormality is seen. No  gallstones, gallbladder wall thickening, or biliary dilatation. Pancreas: Pancreatic atrophy.  No mass.  No inflammation. Spleen: Normal in size without focal abnormality. Adrenals/Urinary Tract: Adrenal glands are unremarkable. Kidneys are normal, without renal calculi, focal lesion, or hydronephrosis. Bladder is unremarkable. Stomach/Bowel: Wall thickening of the distal sigmoid colon. There is adjacent inflammatory change. An irregular extraluminal fluid collection, containing non dependent air, tracks along the lower sigmoid colon to the level of the rectosigmoid junction, measuring approximately 6 cm from superior to inferior by 2.6 x 2.6 cm transversely. There are numerous diverticula along  the remainder of the sigmoid colon with no other areas of wall thickening or inflammation. Remainder of the colon is normal in caliber with no evidence of inflammation. Normal stomach and small bowel. Normal appendix. Vascular/Lymphatic: Aortic atherosclerosis. No aneurysm. No enlarged lymph nodes. Reproductive: Status post hysterectomy.  No pelvic/adnexal masses. Other: No hernia.  No ascites.  No free air. Musculoskeletal: No fracture or acute finding.  No bone lesion. IMPRESSION: 1. Current findings are consistent with complicated sigmoid diverticulitis. Distal sigmoid colon shows wall thickening and adjacent inflammation. There is an adjacent irregular fluid collection consistent with a peridiverticular abscess in the posteroinferior pelvis that measures 6 x 2.6 x 2.6 cm. Inflammatory changes have become more evident compared to the prior CTs. No bowel obstruction. 2. No other acute abnormality.  No free air. Electronically Signed   By: Lajean Manes M.D.   On: 03/29/2022 11:32      Scheduled Meds:  acetaminophen  1,000 mg Oral On Call to OR   alvimopan  12 mg Oral On Call to OR   aspirin EC  81 mg Oral Daily   bupivacaine liposome  20 mL Infiltration Once   enoxaparin (LOVENOX) injection  40 mg Subcutaneous Once   furosemide  40 mg Oral BID   gabapentin  300 mg Oral On Call to OR   levothyroxine  112 mcg Oral QAC breakfast   Nintedanib  100 mg Oral Q12H   nitroGLYCERIN  0.5 inch Topical Q6H   potassium chloride SA  20 mEq Oral BID   Continuous Infusions:  sodium chloride 50 mL/hr at 03/31/22 0522   cefoTEtan (CEFOTAN) IV     methocarbamol (ROBAXIN) IV     piperacillin-tazobactam (ZOSYN)  IV 3.375 g (03/31/22 0948)   promethazine (PHENERGAN) injection (IM or IVPB)       LOS: 2 days   Time spent: 25 minutes   Dessa Phi, DO [facility] 03/31/2022, 10:45 AM   Available via Epic secure chat 7am-7pm After these hours, please refer to coverage provider listed on  [email or web address]

## 2022-03-31 NOTE — Anesthesia Preprocedure Evaluation (Signed)
Anesthesia Evaluation  Patient identified by MRN, date of birth, ID band Patient awake    Reviewed: Allergy & Precautions, NPO status , Patient's Chart, lab work & pertinent test results  Airway Mallampati: I       Dental no notable dental hx.    Pulmonary    Pulmonary exam normal        Cardiovascular Normal cardiovascular exam     Neuro/Psych    GI/Hepatic negative GI ROS, Neg liver ROS,,,  Endo/Other  Hypothyroidism    Renal/GU Renal InsufficiencyRenal disease  negative genitourinary   Musculoskeletal   Abdominal Normal abdominal exam  (+)   Peds  Hematology  (+) Blood dyscrasia, anemia   Anesthesia Other Findings   Reproductive/Obstetrics                             Anesthesia Physical Anesthesia Plan  ASA: 2  Anesthesia Plan: General   Post-op Pain Management: Ofirmev IV (intra-op)*   Induction: Intravenous  PONV Risk Score and Plan: 4 or greater and Ondansetron, Dexamethasone and Treatment may vary due to age or medical condition  Airway Management Planned: Oral ETT  Additional Equipment: None  Intra-op Plan:   Post-operative Plan: Extubation in OR  Informed Consent: I have reviewed the patients History and Physical, chart, labs and discussed the procedure including the risks, benefits and alternatives for the proposed anesthesia with the patient or authorized representative who has indicated his/her understanding and acceptance.     Dental advisory given  Plan Discussed with: CRNA  Anesthesia Plan Comments:        Anesthesia Quick Evaluation

## 2022-03-31 NOTE — Anesthesia Procedure Notes (Signed)
Procedure Name: Intubation Date/Time: 03/31/2022 12:20 PM  Performed by: Gean Maidens, CRNAPre-anesthesia Checklist: Patient identified, Emergency Drugs available, Suction available, Patient being monitored and Timeout performed Patient Re-evaluated:Patient Re-evaluated prior to induction Oxygen Delivery Method: Circle system utilized Preoxygenation: Pre-oxygenation with 100% oxygen Induction Type: IV induction and Rapid sequence Laryngoscope Size: Mac and 3 Grade View: Grade I Tube size: 6.5 mm Number of attempts: 1 Airway Equipment and Method: Stylet Placement Confirmation: ETT inserted through vocal cords under direct vision, positive ETCO2 and breath sounds checked- equal and bilateral Secured at: 21 cm Tube secured with: Tape Dental Injury: Teeth and Oropharynx as per pre-operative assessment

## 2022-03-31 NOTE — Transfer of Care (Signed)
Immediate Anesthesia Transfer of Care Note  Patient: Alexandria [name]  Procedure(s) Performed: XI ROBOTIC ASSISTED LOWER ANTERIOR RESECTION  Patient Location: PACU  Anesthesia Type:General  Level of Consciousness: sedated, patient cooperative, and responds to stimulation  Airway & Oxygen Therapy: Patient Spontanous Breathing and Patient connected to face mask oxygen  Post-op Assessment: Report given to RN and Post -op Vital signs reviewed and stable  Post vital signs: Reviewed and stable  Last Vitals:  Vitals Value Taken Time  BP 129/68 03/31/22 1530  Temp    Pulse 60 03/31/22 1531  Resp 19 03/31/22 1531  SpO2 99 % 03/31/22 1531  Vitals shown include unvalidated device data.  Last Pain:  Vitals:   03/31/22 1057  TempSrc: Oral  PainSc: 10-Worst pain ever      Patients Stated Pain Goal: 2 (70/26/37 8588)  Complications: No notable events documented.

## 2022-03-31 NOTE — Anesthesia Postprocedure Evaluation (Signed)
Anesthesia Post Note  Patient: Alexandria [name]  Procedure(s) Performed: XI ROBOTIC ASSISTED LOWER ANTERIOR RESECTION     Patient location during evaluation: PACU Anesthesia Type: General Level of consciousness: awake and sedated Pain management: pain level controlled Vital Signs Assessment: post-procedure vital signs reviewed and stable Respiratory status: spontaneous breathing Cardiovascular status: stable Postop Assessment: no apparent nausea or vomiting Anesthetic complications: no  No notable events documented.  Last Vitals:  Vitals:   03/31/22 1615 03/31/22 1630  BP: (!) 97/49 (!) 97/52  Pulse: (!) 53 (!) 53  Resp: 15 16  Temp: (!) 36 C (!) 36.3 C  SpO2: 96% 96%    Last Pain:  Vitals:   03/31/22 1630  TempSrc:   PainSc: [name]

## 2022-03-31 NOTE — Progress Notes (Signed)
Subjective/Chief Complaint: States pain is worse today than yesterday. Not passing much flatus and no BM. Has only had sips of water with medications   Objective: Vital signs in last 24 hours: Temp:  [97.5 F (36.4 C)-98 F (36.7 C)] 97.5 F (36.4 C) (12/26 0550) Pulse Rate:  [63-72] 63 (12/26 0550) Resp:  [16-18] 16 (12/26 0550) BP: (133-137)/(65-66) 134/65 (12/26 0550) SpO2:  [95 %-98 %] 95 % (12/26 0550) Last BM Date : 03/24/22  Intake/Output from previous day: 12/25 0701 - 12/26 0700 In: 1072.1 [P.O.:20; I.V.:902.1; IV Piggyback:150] Out: 2100 [Urine:2100] Intake/Output this shift: No intake/output data recorded.  Alert, well-appearing, no distress Unlabored respirations Abdomen mildly distended, diffusely tender without peritoneal signs  Lab Results:  Recent Labs    03/29/22 2355 03/31/22 0421  WBC 9.8 6.8  HGB 10.8* 10.0*  HCT 33.6* 31.2*  PLT 280 260    BMET Recent Labs    03/29/22 2355 03/31/22 0421  NA 135 135  K 4.0 3.5  CL 100 99  CO2 26 27  GLUCOSE 100* 81  BUN 10 11  CREATININE 0.94 1.01*  CALCIUM 8.4* 8.1*    PT/INR Recent Labs    03/30/22 0129  LABPROT 15.2  INR 1.2    ABG No results for input(s): "PHART", "HCO3" in the last 72 hours.  Invalid input(s): "PCO2", "PO2"  Studies/Results: CT ABDOMEN PELVIS W CONTRAST  Result Date: 03/29/2022 CLINICAL DATA:  Constipation. Clinical suspicion for diverticulitis. EXAM: CT ABDOMEN AND PELVIS WITH CONTRAST TECHNIQUE: Multidetector CT imaging of the abdomen and pelvis was performed using the standard protocol following bolus administration of intravenous contrast. RADIATION DOSE REDUCTION: This exam was performed according to the departmental dose-optimization program which includes automated exposure control, adjustment of the mA and/or kV according to patient size and/or use of iterative reconstruction technique. CONTRAST:  156m OMNIPAQUE IOHEXOL 300 MG/ML  SOLN COMPARISON:   03/20/2022.  03/13/2022. FINDINGS: Lower chest: Chronic fibrotic changes at the lung bases, stable. No acute findings. Hepatobiliary: No focal liver abnormality is seen. No gallstones, gallbladder wall thickening, or biliary dilatation. Pancreas: Pancreatic atrophy.  No mass.  No inflammation. Spleen: Normal in size without focal abnormality. Adrenals/Urinary Tract: Adrenal glands are unremarkable. Kidneys are normal, without renal calculi, focal lesion, or hydronephrosis. Bladder is unremarkable. Stomach/Bowel: Wall thickening of the distal sigmoid colon. There is adjacent inflammatory change. An irregular extraluminal fluid collection, containing non dependent air, tracks along the lower sigmoid colon to the level of the rectosigmoid junction, measuring approximately 6 cm from superior to inferior by 2.6 x 2.6 cm transversely. There are numerous diverticula along the remainder of the sigmoid colon with no other areas of wall thickening or inflammation. Remainder of the colon is normal in caliber with no evidence of inflammation. Normal stomach and small bowel. Normal appendix. Vascular/Lymphatic: Aortic atherosclerosis. No aneurysm. No enlarged lymph nodes. Reproductive: Status post hysterectomy.  No pelvic/adnexal masses. Other: No hernia.  No ascites.  No free air. Musculoskeletal: No fracture or acute finding.  No bone lesion. IMPRESSION: 1. Current findings are consistent with complicated sigmoid diverticulitis. Distal sigmoid colon shows wall thickening and adjacent inflammation. There is an adjacent irregular fluid collection consistent with a peridiverticular abscess in the posteroinferior pelvis that measures 6 x 2.6 x 2.6 cm. Inflammatory changes have become more evident compared to the prior CTs. No bowel obstruction. 2. No other acute abnormality.  No free air. Electronically Signed   By: DLajean ManesM.D.   On: 03/29/2022 11:32  Anti-infectives: Anti-infectives (From admission, onward)    Start      Dose/Rate Route Frequency Ordered Stop   03/29/22 0800  piperacillin-tazobactam (ZOSYN) IVPB 3.375 g        3.375 g 12.5 mL/hr over 240 Minutes Intravenous Every 8 hours 03/29/22 0746 04/03/22 0800       Assessment/Plan:  Likely end-stage diverticulitis with pelvic abscess - CT 12/24 with pelvic abscess, assessed by RI and this is not currently amenable to percutaneous drainage.  - Continue IV antibiotics, bowel rest. - continues to have obstructive symptoms. At this point she needs surgical intervention likely to include a colostomy either today or tomorrow  FEN: sips with meds ID: [email or web address] VTE: okay for chemical ppx from surgical perspective  Chronic diastolic HF Hypothyroidism Pulmonary fibrosis HTN    LOS: 2 days   Winferd Humphrey, [facility] 03/31/2022, 9:47 AM Please see Amion for pager number during day hours 7:00am-4:30pm

## 2022-04-01 ENCOUNTER — Encounter (HOSPITAL_COMMUNITY): Payer: Self-pay | Admitting: Surgery

## 2022-04-01 DIAGNOSIS — K5730 Diverticulosis of large intestine without perforation or abscess without bleeding: Secondary | ICD-10-CM | POA: Diagnosis not present

## 2022-04-01 LAB — CBC
HCT: 26.1 % — ABNORMAL LOW (ref 36.0–46.0)
Hemoglobin: 8.2 g/dL — ABNORMAL LOW (ref 12.0–15.0)
MCH: 32.8 pg (ref 26.0–34.0)
MCHC: 31.4 g/dL (ref 30.0–36.0)
MCV: 104.4 fL — ABNORMAL HIGH (ref 80.0–100.0)
Platelets: 261 10*3/uL (ref 150–400)
RBC: 2.5 MIL/uL — ABNORMAL LOW (ref 3.87–5.11)
RDW: 14.2 % (ref 11.5–15.5)
WBC: 10.6 10*3/uL — ABNORMAL HIGH (ref 4.0–10.5)
nRBC: 0 % (ref 0.0–0.2)

## 2022-04-01 LAB — BASIC METABOLIC PANEL
Anion gap: 14 (ref 5–15)
BUN: 14 mg/dL (ref 8–23)
CO2: 21 mmol/L — ABNORMAL LOW (ref 22–32)
Calcium: 8 mg/dL — ABNORMAL LOW (ref 8.9–10.3)
Chloride: 101 mmol/L (ref 98–111)
Creatinine, Ser: 1.04 mg/dL — ABNORMAL HIGH (ref 0.44–1.00)
GFR, Estimated: 55 mL/min — ABNORMAL LOW (ref >60)
Glucose, Bld: 122 mg/dL — ABNORMAL HIGH (ref 70–99)
Potassium: 4.7 mmol/L (ref 3.5–5.1)
Sodium: 136 mmol/L (ref 135–145)

## 2022-04-01 MED ORDER — ENOXAPARIN SODIUM 40 MG/0.4ML IJ SOSY
40.0000 mg | PREFILLED_SYRINGE | INTRAMUSCULAR | Status: DC
  Administered 2022-04-01 – 2022-04-02 (×2): 40 mg via SUBCUTANEOUS
  Filled 2022-04-01 (×2): qty 0.4

## 2022-04-01 MED ORDER — METHOCARBAMOL 500 MG PO TABS
1000.0000 mg | ORAL_TABLET | Freq: Four times a day (QID) | ORAL | Status: DC | PRN
  Administered 2022-04-01: 1000 mg via ORAL
  Filled 2022-04-01: qty 2

## 2022-04-01 NOTE — Plan of Care (Signed)
  Problem: Clinical Measurements: Goal: Ability to maintain clinical measurements within normal limits will improve Outcome: Progressing   Problem: Nutrition: Goal: Adequate nutrition will be maintained Outcome: Progressing   Problem: Pain Managment: Goal: General experience of comfort will improve Outcome: Progressing   Problem: Safety: Goal: Ability to remain free from injury will improve Outcome: Progressing   

## 2022-04-01 NOTE — Progress Notes (Signed)
PROGRESS NOTE    Alexandria [name]  KZS:[id number] DOB: [birth year] DOA: 03/28/2022 PCP: [name], DO     Brief Narrative:  Alexandria [name] is a 76 y/o with a history of hypothyroidism, grade I DD, HTN, IPF, diverticulosis, diverticulitis, DVT, chronic hypokalemia who has a long history of constipation and has recently tried Linzess but stopped after 2 weeks due to lack of efficacy by her report. She recently got CT abd/pelvis 03/20/22 which revealed an enhancing lesion/mass just below the sigmoid-rectal junction. Flex Sig was performed 03/20/22 which revealed a polyupoid mass at the sigmoid-rectal junction. Bx was readout by pathology as an inflammatory poly w/o dysplasia or malignancy. Patient had MRI abd/pelvis 03/26/22 - reading pending. Due to continued constipation and diffuse abdominal pain, worst in the LUQ, she presented to [facility] for evaluation. General surgery was consulted. Patient underwent repeat CT abd/pelvis and thought to have complicated diverticulitis with abscess. Unable to drain per IR.  Ultimately, she underwent rectosigmoid resection with colostomy by Dr. [name] 12/26.  New events last 24 hours / Subjective: Pain is much improved since prior to surgery.  Rates pain about a 4.  No nausea.  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 -CT A/P  consistent with complicated sigmoid diverticulitis. Distal sigmoid colon shows wall thickening and adjacent inflammation. There is an adjacent irregular fluid collection consistent with a peridiverticular abscess in the posteroinferior pelvis that measures 6 x 2.6 x 2.6 cm. Inflammatory changes have become more evident compared to the prior CTs. No bowel obstruction. -General surgery following -Status post rectosigmoid resection with colostomy by Dr. [name] 12/26 -Empiric Zosyn   Chronic diastolic HF -Hold lasix, monitor fluid status to avoid overload    Hypothyroidism -Synthroid    History of idiopathic pulmonary fibrosis -Without exacerbation -Ofev    DVT prophylaxis:  enoxaparin (LOVENOX) injection 40 mg Start: 04/01/22 1000 Place and maintain sequential compression device Start: 03/30/22 0940  Code Status: Full Family Communication: none at bedside  Disposition Plan:  Status is: Inpatient Remains inpatient appropriate because: Continue to monitor postoperative  Consultants:  Gen surg IR   Antimicrobials:  Anti-infectives (From admission, onward)    Start     Dose/Rate Route Frequency Ordered Stop   03/31/22 1045  cefoTEtan (CEFOTAN) 2 g in sodium chloride 0.9 % 100 mL IVPB        2 g 200 mL/hr over 30 Minutes Intravenous On call to O.R. 03/31/22 0959 03/31/22 1225   03/29/22 0800  piperacillin-tazobactam (ZOSYN) IVPB 3.375 g        3.375 g 12.5 mL/hr over 240 Minutes Intravenous Every 8 hours 03/29/22 0746 04/05/22 1621        Objective: Vitals:   03/31/22 2139 04/01/22 0121 04/01/22 0459 04/01/22 0858  BP: 113/60 121/64 108/63 (!) 115/57  Pulse: 73  75 74  Resp: '18 18 18 18  '$ Temp: 97.8 F (36.6 C) 99.1 F (37.3 C) (!)  97.5 F (36.4 C) 97.6 F (36.4 C)  TempSrc: Oral Oral Oral Oral  SpO2: 98%  99% 97%  Weight:      Height:        Intake/Output Summary (Last 24 hours) at 04/01/2022 1142 Last data filed at 04/01/2022 0949 Gross per 24 hour  Intake  3082.61 ml  Output 1790 ml  Net 1292.61 ml    Filed Weights   03/28/22 1738 03/31/22 1057  Weight: 63 kg 63 kg    Examination:  General exam: Appears calm but uncomfortable  Respiratory system: Clear to auscultation. Respiratory effort normal. No respiratory distress. No conversational dyspnea.  Cardiovascular system: S1 & S2 heard, RRR. No murmurs. No pedal edema. Gastrointestinal system: Abdomen is nondistended, soft, +colostomy with stool, +drain  Central nervous system: Alert and oriented. No focal neurological deficits. Speech clear.  Extremities: Symmetric in appearance  Skin: No rashes, lesions or ulcers on exposed skin  Psychiatry: Judgement and insight appear normal. Mood & affect appropriate.   Data Reviewed: I have personally reviewed following labs and imaging studies  CBC: Recent Labs  Lab 03/28/22 1904 03/29/22 2355 03/31/22 0421 04/01/22 0605  WBC 13.4* 9.8 6.8 10.6*  NEUTROABS 9.7*  --   --   --   HGB 12.5 10.8* 10.0* 8.2*  HCT 38.1 33.6* 31.2* 26.1*  MCV 99.7 102.4* 101.6* 104.4*  PLT 325 280 260 263    Basic Metabolic Panel: Recent Labs  Lab 03/28/22 1904 03/29/22 2355 03/31/22 0421 04/01/22 0605  NA 132* 135 135 136  K 3.7 4.0 3.5 4.7  CL 95* 100 99 101  CO2 '30 26 27 '$ 21*  GLUCOSE 116* 100* 81 122*  BUN '13 10 11 14  '$ CREATININE 0.84 0.94 1.01* 1.04*  CALCIUM 8.6* 8.4* 8.1* 8.0*  MG  --   --  2.3  --     GFR: Estimated Creatinine Clearance: 39.1 mL/min (A) (by C-G formula based on SCr of 1.04 mg/dL (H)). Liver Function Tests: Recent Labs  Lab 03/28/22 1904  AST 19  ALT 13  ALKPHOS 77  BILITOT 1.0  PROT 8.1  ALBUMIN 3.1*    Recent Labs  Lab 03/28/22 1904  LIPASE 25    No results for input(s): "AMMONIA" in the last 168 hours. Coagulation Profile: Recent Labs  Lab 03/30/22 0129  INR 1.2    Cardiac Enzymes: No results for input(s): "CKTOTAL", "CKMB", "CKMBINDEX", "TROPONINI" in the last 168 hours. BNP (last 3  results) No results for input(s): "PROBNP" in the last 8760 hours. HbA1C: No results for input(s): "HGBA1C" in the last 72 hours. CBG: Recent Labs  Lab 03/29/22 2355  GLUCAP 103*    Lipid Profile: No results for input(s): "CHOL", "HDL", "LDLCALC", "TRIG", "CHOLHDL", "LDLDIRECT" in the last 72 hours. Thyroid Function Tests: No results for input(s): "TSH", "T4TOTAL", "FREET4", "T3FREE", "THYROIDAB" in the last 72 hours. Anemia Panel: No results for input(s): "VITAMINB12", "FOLATE", "FERRITIN", "TIBC", "IRON", "RETICCTPCT" in the last 72 hours. Sepsis Labs: No results for input(s): "PROCALCITON", "LATICACIDVEN" in the last 168 hours.  No results found for this or any previous visit (from the past 240 hour(s)).    Radiology Studies: No results found.    Scheduled Meds:  acetaminophen  1,000 mg Oral Q6H   vitamin C  500 mg Oral BID   aspirin EC  81 mg Oral Daily   enoxaparin (LOVENOX) injection  40 mg Subcutaneous Q24H   feeding supplement  237 mL Oral BID BM   gabapentin  200  mg Oral TID   levothyroxine  112 mcg Oral QAC breakfast   lip balm   Topical BID   multivitamins with iron  1 tablet Oral Daily   Nintedanib  100 mg Oral Q12H   nitroGLYCERIN  0.5 inch Topical Q6H   polycarbophil  625 mg Oral BID   sodium chloride flush  3 mL Intravenous Q12H   Continuous Infusions:  sodium chloride 50 mL/hr at 03/31/22 1742   sodium chloride     lactated ringers     ondansetron (ZOFRAN) IV     piperacillin-tazobactam (ZOSYN)  IV 3.375 g (04/01/22 0844)   promethazine (PHENERGAN) injection (IM or IVPB)       LOS: 3 days   Time spent: 25 minutes   Dessa Phi, DO [facility] 04/01/2022, 11:42 AM   Available via Epic secure chat 7am-7pm After these hours, please refer to coverage provider listed on [email or web address]

## 2022-04-01 NOTE — Consult Note (Signed)
[place] Nurse ostomy consult note Pt had colostomy surgery performed yesterday. She states she does not need any family members present for teaching sessions. Stoma type/location: Stoma is red and viable, slightly above skin level, 1 1/4 inches.   Peristomal assessment: intact skin surrounding Output: 50cc semi-formed brown stool Ostomy pouching: Applied barrier ring and 2 piece pouching system; but patient will need a flexible convex pouch during the subsequent pouch changes.  Ordered 5 sets of supplies to the room: use supplies: barrier ring Kellie Simmering # [id number], and convex pouch Lawson # [id number]. Educational materials left at the bedside.  Education provided: Demonstrated pouch change.  Pt was able to stretch and apply barrier ring to back of wafer, and clip together 2 piece system with assistance.  She was able to open and close Velcro to empty.  Discussed pouching routines and ordering supplies.  Enrolled patient in [place] program: Yes; today.  [place] team will follow for further teaching sessions later this week.  Thank-you,  Julien Girt MSN, [place], [place], [place], [place]

## 2022-04-01 NOTE — Progress Notes (Signed)
1 Day Post-Op   Subjective/Chief Complaint: Pain is actually better from pre operatively. Has had stool output. Tolerating diet without nausea/vomiting or abdominal pain. Ambulating in room  Objective: Vital signs in last 24 hours: Temp:  [96 F (35.6 C)-99.1 F (37.3 C)] 97.5 F (36.4 C) (12/27 0459) Pulse Rate:  [50-75] 75 (12/27 0459) Resp:  [14-19] 18 (12/27 0459) BP: (97-144)/(49-79) 108/63 (12/27 0459) SpO2:  [95 %-100 %] 99 % (12/27 0459) Weight:  [63 kg] 63 kg (12/26 1057) Last BM Date : 03/31/22  Intake/Output from previous day: 12/26 0701 - 12/27 0700 In: 3082.6 [P.O.:90; I.V.:2830.8; IV Piggyback:161.8] Out: 1710 [Urine:925; Drains:685; Blood:100] Intake/Output this shift: No intake/output data recorded.  Alert, well-appearing, no distress Unlabored respirations Abdomen very mildly distended, mild TTP around incisions greatest in RLQ without rebound or guarding. Incision c/d/I with dressings in place with some scan dried blood. Colostomy with soft brown stool output - stoma not visible  Lab Results:  Recent Labs    03/31/22 0421 04/01/22 0605  WBC 6.8 10.6*  HGB 10.0* 8.2*  HCT 31.2* 26.1*  PLT 260 261    BMET Recent Labs    03/31/22 0421 04/01/22 0605  NA 135 136  K 3.5 4.7  CL 99 101  CO2 27 21*  GLUCOSE 81 122*  BUN 11 14  CREATININE 1.01* 1.04*  CALCIUM 8.1* 8.0*    PT/INR Recent Labs    03/30/22 0129  LABPROT 15.2  INR 1.2    ABG No results for input(s): "PHART", "HCO3" in the last 72 hours.  Invalid input(s): "PCO2", "PO2"  Studies/Results: No results found.  Anti-infectives: Anti-infectives (From admission, onward)    Start     Dose/Rate Route Frequency Ordered Stop   03/31/22 1045  cefoTEtan (CEFOTAN) 2 g in sodium chloride 0.9 % 100 mL IVPB        2 g 200 mL/hr over 30 Minutes Intravenous On call to O.R. 03/31/22 0959 03/31/22 1225   03/29/22 0800  piperacillin-tazobactam (ZOSYN) IVPB 3.375 g        3.375 g 12.5 mL/hr  over 240 Minutes Intravenous Every 8 hours 03/29/22 0746 04/05/22 1621       Assessment/Plan: Likely end-stage diverticulitis with pelvic abscess POD#1 s/p robotic lower anterior rectosigmoid resection with colostomy and drainage of pelvic abscess by Dr. 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 Advance to soft - pain controlled - transition to PO pain meds as much as possible today - Continue IV antibiotics for now - WOC following for new ostomy  FEN: soft ID: zosyn VTE: lovenox  Chronic diastolic HF Hypothyroidism Pulmonary fibrosis HTN    LOS: 3 days   Alexandria [name], [facility] 04/01/2022, 8:06 AM Please see Amion for pager number during day hours 7:00am-4:30pm

## 2022-04-02 DIAGNOSIS — K5730 Diverticulosis of large intestine without perforation or abscess without bleeding: Secondary | ICD-10-CM | POA: Diagnosis not present

## 2022-04-02 LAB — BASIC METABOLIC PANEL
Anion gap: 8 (ref 5–15)
BUN: 16 mg/dL (ref 8–23)
CO2: 27 mmol/L (ref 22–32)
Calcium: 8.1 mg/dL — ABNORMAL LOW (ref 8.9–10.3)
Chloride: 100 mmol/L (ref 98–111)
Creatinine, Ser: 0.99 mg/dL (ref 0.44–1.00)
GFR, Estimated: 59 mL/min — ABNORMAL LOW (ref >60)
Glucose, Bld: 130 mg/dL — ABNORMAL HIGH (ref 70–99)
Potassium: 3.4 mmol/L — ABNORMAL LOW (ref 3.5–5.1)
Sodium: 135 mmol/L (ref 135–145)

## 2022-04-02 LAB — SURGICAL PATHOLOGY

## 2022-04-02 LAB — CBC
HCT: 22.6 % — ABNORMAL LOW (ref 36.0–46.0)
Hemoglobin: 7.3 g/dL — ABNORMAL LOW (ref 12.0–15.0)
MCH: 32.6 pg (ref 26.0–34.0)
MCHC: 32.3 g/dL (ref 30.0–36.0)
MCV: 100.9 fL — ABNORMAL HIGH (ref 80.0–100.0)
Platelets: 258 10*3/uL (ref 150–400)
RBC: 2.24 MIL/uL — ABNORMAL LOW (ref 3.87–5.11)
RDW: 14.5 % (ref 11.5–15.5)
WBC: 13.3 10*3/uL — ABNORMAL HIGH (ref 4.0–10.5)
nRBC: 0 % (ref 0.0–0.2)

## 2022-04-02 LAB — PREPARE RBC (CROSSMATCH)

## 2022-04-02 MED ORDER — LIDOCAINE 5 % EX PTCH
1.0000 | MEDICATED_PATCH | CUTANEOUS | Status: DC
  Administered 2022-04-02 – 2022-04-10 (×9): 1 via TRANSDERMAL
  Filled 2022-04-02 (×20): qty 1

## 2022-04-02 MED ORDER — METHOCARBAMOL 500 MG PO TABS
1000.0000 mg | ORAL_TABLET | Freq: Four times a day (QID) | ORAL | Status: DC
  Administered 2022-04-02 (×3): 1000 mg via ORAL
  Filled 2022-04-02 (×4): qty 2

## 2022-04-02 MED ORDER — SODIUM CHLORIDE 0.9% IV SOLUTION: Freq: Once | INTRAVENOUS | Status: AC

## 2022-04-02 MED ORDER — POTASSIUM CHLORIDE CRYS ER 20 MEQ PO TBCR
40.0000 meq | EXTENDED_RELEASE_TABLET | Freq: Once | ORAL | Status: AC
  Administered 2022-04-02: 40 meq via ORAL
  Filled 2022-04-02: qty 2

## 2022-04-02 NOTE — Progress Notes (Signed)
Mobility Specialist - Progress Note   04/02/22 1241  Therapy Vitals  BP 130/72  Patient Position (if appropriate) Sitting  Oxygen Therapy  SpO2 97 %  O2 Device Room Air  Mobility  Activity Ambulated with assistance to bathroom  Level of Assistance Standby assist, set-up cues, supervision of patient - no hands on  Assistive Device Front wheel walker  Distance Ambulated (ft) 20 ft  Activity Response Tolerated well  Mobility Referral Yes  $Mobility charge 1 Mobility   Pt received in bed requesting assistance to bathroom. C/o pain throughout ambulation, so decided to ambulate in the afternoon. Pt stated feeling dizzy. Pt to bed after session with all needs met. Nurse notified.    Post-mobility:  [place] Specialist

## 2022-04-02 NOTE — Progress Notes (Signed)
PROGRESS NOTE    Alexandria [name]  WUJ:[id number] DOB: 1945-06-10 DOA: 03/28/2022 PCP: [name], DO     Brief Narrative:  Alexandria [name] is a 76 y/o with a history of hypothyroidism, grade I DD, HTN, IPF, diverticulosis, diverticulitis, DVT, chronic hypokalemia who has a long history of constipation and has recently tried Linzess but stopped after 2 weeks due to lack of efficacy by her report. She recently got CT abd/pelvis 03/20/22 which revealed an enhancing lesion/mass just below the sigmoid-rectal junction. Flex Sig was performed 03/20/22 which revealed a polyupoid mass at the sigmoid-rectal junction. Bx was readout by pathology as an inflammatory poly w/o dysplasia or malignancy. Patient had MRI abd/pelvis 03/26/22 - reading pending. Due to continued constipation and diffuse abdominal pain, worst in the LUQ, she presented to [facility] for evaluation. General surgery was consulted. Patient underwent repeat CT abd/pelvis and thought to have complicated diverticulitis with abscess. Unable to drain per IR.  Ultimately, she underwent rectosigmoid resection with colostomy by Dr. [name] 12/26.  New events last 24 hours / Subjective: Having some LLQ pain today. Also with dizziness when getting out of bed.   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 -CT A/P consistent with complicated sigmoid diverticulitis. Distal  sigmoid colon shows wall thickening and adjacent inflammation. There is an adjacent irregular fluid collection consistent with a peridiverticular abscess in the posteroinferior pelvis that measures 6 x 2.6 x 2.6 cm. Inflammatory changes have become more evident compared to the prior CTs. No bowel obstruction. -General surgery following -Status post rectosigmoid resection with colostomy by Dr. [name] 12/26 -Empiric Zosyn  Symptomatic anemia and postop blood loss anemia -Transfuse 1 unit pRBC today    Chronic diastolic HF -Hold lasix, monitor fluid status to avoid overload    Hypothyroidism -Synthroid    History of idiopathic pulmonary fibrosis -Without exacerbation -Ofev   Hypokalemia -Replace    DVT prophylaxis:  Place and maintain sequential compression device Start: 04/02/22 1044 Place and maintain sequential compression device Start: 03/30/22 0940  Code Status: Full Family Communication: none at bedside  Disposition Plan:  Status is: Inpatient Remains inpatient appropriate because: post-op care. Blood transfusion today.   Consultants:  Gen surg IR   Antimicrobials:  Anti-infectives (From admission, onward)    Start     Dose/Rate Route Frequency Ordered Stop   03/31/22 1045  cefoTEtan (CEFOTAN) 2 g in sodium chloride 0.9 % 100 mL IVPB        2 g 200 mL/hr over 30 Minutes Intravenous On call to O.R. 03/31/22 0959 03/31/22 1225   03/29/22 0800  piperacillin-tazobactam (ZOSYN) IVPB 3.375 g        3.375 g 12.5 mL/hr over 240 Minutes Intravenous Every 8 hours 03/29/22 0746 04/05/22 1621        Objective: Vitals:   04/01/22 1413 04/01/22 2000 04/02/22 0416 04/02/22 1241  BP: (!) 111/52 (!) 116/58 127/60 130/72  Pulse: 77 73 73   Resp:  18 18  Temp: 98.3 F (36.8 C) 99.7 F (37.6 C) 98.1 F (36.7 C)   TempSrc: Oral Oral Oral   SpO2:  98% 97% 97%  Weight:      Height:        Intake/Output Summary (Last 24 hours) at 04/02/2022 1248 Last data filed at  04/02/2022 1145 Gross per 24 hour  Intake 1156.84 ml  Output 1780 ml  Net -623.16 ml    Filed Weights   03/28/22 1738 03/31/22 1057  Weight: 63 kg 63 kg    Examination:  General exam: Appears calm but weak and fatigued appearing  Respiratory system: Clear to auscultation. Respiratory effort normal. No respiratory distress. No conversational dyspnea.  Cardiovascular system: S1 & S2 heard, RRR. No murmurs. No pedal edema. Gastrointestinal system: Abdomen is nondistended, soft, +colostomy with stool, +drain  Central nervous system: Alert and oriented. No focal neurological deficits. Speech clear.  Extremities: Symmetric in appearance  Skin: No rashes, lesions or ulcers on exposed skin  Psychiatry: Judgement and insight appear normal. Mood & affect appropriate.   Data Reviewed: I have personally reviewed following labs and imaging studies  CBC: Recent Labs  Lab 03/28/22 1904 03/29/22 2355 03/31/22 0421 04/01/22 0605 04/02/22 0529  WBC 13.4* 9.8 6.8 10.6* 13.3*  NEUTROABS 9.7*  --   --   --   --   HGB 12.5 10.8* 10.0* 8.2* 7.3*  HCT 38.1 33.6* 31.2* 26.1* 22.6*  MCV 99.7 102.4* 101.6* 104.4* 100.9*  PLT 325 280 260 261 034    Basic Metabolic Panel: Recent Labs  Lab 03/28/22 1904 03/29/22 2355 03/31/22 0421 04/01/22 0605 04/02/22 0529  NA 132* 135 135 136 135  K 3.7 4.0 3.5 4.7 3.4*  CL 95* 100 99 101 100  CO2 '30 26 27 '$ 21* 27  GLUCOSE 116* 100* 81 122* 130*  BUN '13 10 11 14 16  '$ CREATININE 0.84 0.94 1.01* 1.04* 0.99  CALCIUM 8.6* 8.4* 8.1* 8.0* 8.1*  MG  --   --  2.3  --   --     GFR: Estimated Creatinine Clearance: 41.1 mL/min (by C-G formula based on SCr of 0.99 mg/dL). Liver Function Tests: Recent Labs  Lab 03/28/22 1904  AST 19  ALT 13  ALKPHOS 77  BILITOT 1.0  PROT 8.1  ALBUMIN 3.1*    Recent Labs  Lab 03/28/22 1904  LIPASE 25    No results for input(s): "AMMONIA" in the last 168 hours. Coagulation Profile: Recent Labs  Lab  03/30/22 0129  INR 1.2    Cardiac Enzymes: No results for input(s): "CKTOTAL", "CKMB", "CKMBINDEX", "TROPONINI" in the last 168 hours. BNP (last 3 results) No results for input(s): "PROBNP" in the last 8760 hours. HbA1C: No results for input(s): "HGBA1C" in the last 72 hours. CBG: Recent Labs  Lab 03/29/22 2355  GLUCAP 103*    Lipid Profile: No results for input(s): "CHOL", "HDL", "LDLCALC", "TRIG", "CHOLHDL", "LDLDIRECT" in the last 72 hours. Thyroid Function Tests: No results for input(s): "TSH", "T4TOTAL", "FREET4", "T3FREE", "THYROIDAB" in the last 72 hours. Anemia Panel: No results for input(s): "VITAMINB12", "FOLATE", "FERRITIN", "TIBC", "IRON", "RETICCTPCT" in the last 72 hours. Sepsis Labs: No results for input(s): "PROCALCITON", "LATICACIDVEN" in the last 168 hours.  No results found for this or any previous visit (from the past 240 hour(s)).    Radiology Studies: No results found.    Scheduled Meds:  sodium chloride   Intravenous Once   acetaminophen  1,000 mg Oral Q6H   vitamin C  500 mg Oral BID   aspirin EC  81 mg Oral Daily   feeding supplement  237 mL Oral BID BM   gabapentin  200 mg Oral TID   levothyroxine  112 mcg Oral QAC breakfast   lidocaine  1 patch Transdermal Q24H   lip balm   Topical BID   methocarbamol  1,000 mg Oral Q6H   multivitamins with iron  1 tablet Oral Daily   Nintedanib  100 mg Oral Q12H   nitroGLYCERIN  0.5 inch Topical Q6H   polycarbophil  625 mg Oral BID   sodium chloride flush  3 mL Intravenous Q12H   Continuous Infusions:  sodium chloride     ondansetron (ZOFRAN) IV     piperacillin-tazobactam (ZOSYN)  IV 3.375 g (04/02/22 0749)   promethazine (PHENERGAN) injection (IM or IVPB)       LOS: 4 days   Time spent: 25 minutes   Dessa Phi, DO [facility] 04/02/2022, 12:48 PM   Available via Epic secure chat 7am-7pm After these hours, please refer to coverage provider listed on [email or web address]

## 2022-04-02 NOTE — Progress Notes (Signed)
2 Days Post-Op   Subjective/Chief Complaint: Having more LLQ pain today that is improved with pain medications but more severe than yesterday. Tolerating soft diet well and having good bowel function. No nausea or emesis  Objective: Vital signs in last 24 hours: Temp:  [97.6 F (36.4 C)-99.7 F (37.6 C)] 98.1 F (36.7 C) (12/28 0416) Pulse Rate:  [73-77] 73 (12/28 0416) Resp:  [18] 18 (12/28 0416) BP: (111-127)/(52-60) 127/60 (12/28 0416) SpO2:  [97 %-98 %] 97 % (12/28 0416) Last BM Date : 04/01/22  Intake/Output from previous day: 12/27 0701 - 12/28 0700 In: 916.8 [P.O.:770; I.V.:3; IV Piggyback:143.8] Out: 1570 [Urine:950; Drains:320; HCWCB:762] Intake/Output this shift: No intake/output data recorded.  Alert, well-appearing, no distress Unlabored respirations Abdomen very mildly distended, mild TTP around incisions and moderate TTP in LLQ lateral to colostomy without rebound or guarding. Incisions c/d/I with dressings in place with some scant dried blood. Colostomy with soft brown stool output - stoma not visible JP drain with sanguinous output  Lab Results:  Recent Labs    04/01/22 0605 04/02/22 0529  WBC 10.6* 13.3*  HGB 8.2* 7.3*  HCT 26.1* 22.6*  PLT 261 258    BMET Recent Labs    04/01/22 0605 04/02/22 0529  NA 136 135  K 4.7 3.4*  CL 101 100  CO2 21* 27  GLUCOSE 122* 130*  BUN 14 16  CREATININE 1.04* 0.99  CALCIUM 8.0* 8.1*    PT/INR No results for input(s): "LABPROT", "INR" in the last 72 hours.  ABG No results for input(s): "PHART", "HCO3" in the last 72 hours.  Invalid input(s): "PCO2", "PO2"  Studies/Results: No results found.  Anti-infectives: Anti-infectives (From admission, onward)    Start     Dose/Rate Route Frequency Ordered Stop   03/31/22 1045  cefoTEtan (CEFOTAN) 2 g in sodium chloride 0.9 % 100 mL IVPB        2 g 200 mL/hr over 30 Minutes Intravenous On call to O.R. 03/31/22 0959 03/31/22 1225   03/29/22 0800   piperacillin-tazobactam (ZOSYN) IVPB 3.375 g        3.375 g 12.5 mL/hr over 240 Minutes Intravenous Every 8 hours 03/29/22 0746 04/05/22 1621       Assessment/Plan: Likely end-stage diverticulitis with pelvic abscess POD#2 s/p robotic lower anterior rectosigmoid resection with colostomy and drainage of pelvic abscess by Dr. 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 Continue pain meds and add lidocaine patch  - WBC elevated to 13.3, afebrile. Continue IV antibiotics - hgb down to 7.3 - trend - drain output decreased to 320 ml/24 hr SS. Continue drain. May be able to remove prior to discharge - WOC following for new ostomy - overall doing well with it. She will need a little more teaching prior to dc  FEN: soft ID: zosyn VTE: lovenox  Chronic diastolic HF Hypothyroidism Pulmonary fibrosis HTN    LOS: 4 days   Winferd Humphrey, [facility] 04/02/2022, 7:29 AM Please see Amion for pager number during day hours 7:00am-4:30pm

## 2022-04-03 ENCOUNTER — Inpatient Hospital Stay (HOSPITAL_COMMUNITY): Payer: Medicare Other

## 2022-04-03 DIAGNOSIS — K5730 Diverticulosis of large intestine without perforation or abscess without bleeding: Secondary | ICD-10-CM | POA: Diagnosis not present

## 2022-04-03 LAB — TYPE AND SCREEN
ABO/RH(D): A NEG
Antibody Screen: NEGATIVE
Unit division: 0

## 2022-04-03 LAB — BASIC METABOLIC PANEL
Anion gap: 7 (ref 5–15)
BUN: 15 mg/dL (ref 8–23)
CO2: 29 mmol/L (ref 22–32)
Calcium: 8.9 mg/dL (ref 8.9–10.3)
Chloride: 98 mmol/L (ref 98–111)
Creatinine, Ser: 0.81 mg/dL (ref 0.44–1.00)
GFR, Estimated: >60 mL/min (ref >60)
Glucose, Bld: 129 mg/dL — ABNORMAL HIGH (ref 70–99)
Potassium: 3.2 mmol/L — ABNORMAL LOW (ref 3.5–5.1)
Sodium: 134 mmol/L — ABNORMAL LOW (ref 135–145)

## 2022-04-03 LAB — BPAM RBC
Blood Product Expiration Date: 202401272359
ISSUE DATE / TIME: 202312281340
Unit Type and Rh: 600

## 2022-04-03 LAB — CBC
HCT: 29.8 % — ABNORMAL LOW (ref 36.0–46.0)
Hemoglobin: 9.8 g/dL — ABNORMAL LOW (ref 12.0–15.0)
MCH: 31.8 pg (ref 26.0–34.0)
MCHC: 32.9 g/dL (ref 30.0–36.0)
MCV: 96.8 fL (ref 80.0–100.0)
Platelets: 300 10*3/uL (ref 150–400)
RBC: 3.08 MIL/uL — ABNORMAL LOW (ref 3.87–5.11)
RDW: 15.6 % — ABNORMAL HIGH (ref 11.5–15.5)
WBC: 11.6 10*3/uL — ABNORMAL HIGH (ref 4.0–10.5)
nRBC: 0 % (ref 0.0–0.2)

## 2022-04-03 MED ORDER — LACTATED RINGERS IV SOLN: INTRAVENOUS | Status: DC

## 2022-04-03 MED ORDER — POTASSIUM CHLORIDE 10 MEQ/100ML IV SOLN
10.0000 meq | INTRAVENOUS | Status: AC
  Administered 2022-04-03 (×3): 10 meq via INTRAVENOUS
  Filled 2022-04-03 (×2): qty 100

## 2022-04-03 MED ORDER — GABAPENTIN 100 MG PO CAPS: 100.0000 mg | ORAL_CAPSULE | Freq: Two times a day (BID) | ORAL | Status: DC

## 2022-04-03 NOTE — Consult Note (Addendum)
  [facility] Nurse ostomy follow-up consult note Pt had colostomy surgery performed . She is vomiting and awaiting NG placement today and is not ready for a teaching session.  She was very lethargic and did not watch or participate in the pouch change. Stoma type/location: Stoma is red and viable, slightly above skin level, 1 1/2 inches.   Peristomal assessment: intact skin surrounding Output: 60cc liquid brown stool Ostomy pouching: Applied barrier ring and one piece convex pouch.  5 sets of supplies in the room: use supplies: barrier ring Kellie Simmering # [id number], and convex pouch Lawson # [id number]. Educational materials left at the bedside.   Enrolled patient in [employer] program: Yes; previously [facility] team will follow for further teaching session next week on Tues. Thank-you,  Julien Girt MSN, [place], [place], [place], [place]

## 2022-04-03 NOTE — Care Management Important Message (Signed)
Important Message  Patient Details IM Letter given. Name: [name] MRN: [id number] Date of Birth: October 09, 1945   Medicare Important Message Given:  Yes     Kerin Salen 04/03/2022, 2:36 PM

## 2022-04-03 NOTE — Progress Notes (Addendum)
3 Days Post-Op   Subjective/Chief Complaint: One unit yesterday, somnolent, states she had emesis yesterday   Objective: Vital signs in last 24 hours: Temp:  [97.6 F (36.4 C)-98.6 F (37 C)] 98.6 F (37 C) (12/29 1505) Pulse Rate:  [66-79] 66 (12/29 0804) Resp:  [17-18] 18 (12/29 0637) BP: (103-182)/(54-91) 182/78 (12/29 0804) SpO2:  [95 %-99 %] 97 % (12/29 0804) Last BM Date : 04/02/22  Intake/Output from previous day: 12/28 0701 - 12/29 0700 In: 1415.2 [P.O.:900; Blood:315; IV Piggyback:200.2] Out: 1540 [Urine:1050; Drains:190; Stool:300] Intake/Output this shift: No intake/output data recorded.  Somnolent Pulm  effort normal Cv regular Ab mild distention and tender, dressings in place, colostomy with some stool no air jp serosang  Lab Results:  Recent Labs    04/02/22 0529 04/03/22 0517  WBC 13.3* 11.6*  HGB 7.3* 9.8*  HCT 22.6* 29.8*  PLT 258 300   BMET Recent Labs    04/02/22 0529 04/03/22 0517  NA 135 134*  K 3.4* 3.2*  CL 100 98  CO2 27 29  GLUCOSE 130* 129*  BUN 16 15  CREATININE 0.99 0.81  CALCIUM 8.1* 8.9   PT/INR No results for input(s): "LABPROT", "INR" in the last 72 hours. ABG No results for input(s): "PHART", "HCO3" in the last 72 hours.  Invalid input(s): "PCO2", "PO2"  Studies/Results: No results found.  Anti-infectives: Anti-infectives (From admission, onward)    Start     Dose/Rate Route Frequency Ordered Stop   03/31/22 1045  cefoTEtan (CEFOTAN) 2 g in sodium chloride 0.9 % 100 mL IVPB        2 g 200 mL/hr over 30 Minutes Intravenous On call to O.R. 03/31/22 0959 03/31/22 1225   03/29/22 0800  piperacillin-tazobactam (ZOSYN) IVPB 3.375 g        3.375 g 12.5 mL/hr over 240 Minutes Intravenous Every 8 hours 03/29/22 0746 04/05/22 1621       Assessment/Plan: Likely diverticulitis with pelvic abscess POD#3 s/p robotic lower anterior rectosigmoid resection with colostomy and drainage of pelvic abscess by Dr. 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 May be able to remove prior to discharge - WOC following for new ostomy - overall doing well with it. She will need a little more teaching prior to dc   FEN: fulls, replace potassium ID: zosyn VTE: lovenox   Chronic diastolic HF Hypothyroidism Pulmonary fibrosis HTN  Rolm Bookbinder 04/03/2022   Addendum: xray not with significant ileus but still vomiting and distended, will make npo and place ng. Not unexpected

## 2022-04-03 NOTE — Progress Notes (Signed)
PROGRESS NOTE    Alexandria [name]  OAC:[id number] DOB: [birth year] DOA: 03/28/2022 PCP: [name], DO    Brief Narrative:  Alexandria [name] is a 76 y/o with a history of hypothyroidism, grade I DD, HTN, IPF, diverticulosis, diverticulitis, DVT, chronic hypokalemia who has a long history of constipation and has recently tried Linzess but stopped after 2 weeks due to lack of efficacy by her report. She recently got CT abd/pelvis 03/20/22 which revealed an enhancing lesion/mass just below the sigmoid-rectal junction. Flex Sig was performed 03/20/22 which revealed a polypoid mass at the sigmoid-rectal junction. Bx was readout by pathology as an inflammatory poly w/o dysplasia or malignancy. Patient had MRI abd/pelvis 03/26/22 -showed severe sigmoid diverticulitis.  Due to continued constipation and diffuse abdominal pain, worst in the LUQ, she presented to [facility] for evaluation. General surgery was consulted. Patient underwent repeat CT abd/pelvis and thought to have complicated diverticulitis with abscess. Unable to drain per IR.  Ultimately, she underwent rectosigmoid resection with colostomy by Dr. [name] 12/26.  With poor recovery postop.     Assessment & Plan:   Complicated sigmoid diverticulitis with pelvic abscess: Rectosigmoid resection and colostomy Dr. [name] 12/26.  IV fluids.  IV Zosyn.  Biopsy with evidence of diverticulitis with pericolonic abscess and perforation.  No malignancy. 12/29, nausea and vomiting.  NG tube inserted.  NPO.  Mobilize.  Adequate pain medications.  Replace electrolytes.  Surgery following.  Symptomatic anemia due to postop blood loss: Previous hemoglobin 12-13.  Hemoglobin dropped to 7.3-1 unit of PRBC-9.8.  Recheck tomorrow.  Today no indication for further transfusion.  Chronic medical issues including Chronic diastolic congestive heart failure, diuresis on hold.  On maintenance IV fluids. Hypothyroidism, can take Synthroid clamping NG  tube. Idiopathic pulmonary fibrosis: Without exacerbation. Hypokalemia: Replaced IV.   DVT prophylaxis: Place and maintain sequential compression device Start: 03/30/22 0940   Code Status: Full code Family Communication: None at the bedside Disposition Plan: Status is: Inpatient Remains inpatient appropriate because: Significant abdominal pain and distention     Consultants:  General surgery  Procedures:  Rectosigmoid resection and colostomy  Antimicrobials:  IV Zosyn 12/23---   Subjective: Patient seen in the morning rounds.  On my evaluation patient was retching and vomiting small amount of bile.  Looked uncomfortable.  Complained of soreness in the abdomen but denied any undue pain.  Feels tired.  Discussed with surgery.  Objective: Vitals:   04/02/22 2013 04/03/22 0637 04/03/22 0804 04/03/22 1048  BP: 123/73 (!) 173/83 (!) 182/78 (!) 165/76  Pulse: 79 79 66 74  Resp: '18 18 18   '$ Temp: 98.1 F (36.7 C) 98.6 F (37 C)    TempSrc: Oral Oral    SpO2: 96% 95% 97% 96%  Weight:      Height:        Intake/Output Summary (Last 24 hours) at 04/03/2022 1330 Last data filed at 04/03/2022 1315 Gross per 24 hour  Intake 1448.25 ml  Output 2035 ml  Net -586.75 ml   Filed Weights   03/28/22 1738 03/31/22 1057  Weight: 63 kg 63 kg    Examination:  General exam: Appears sick.  Tired and lethargic. Respiratory system: Clear to auscultation. Respiratory effort normal.  No added sounds. Cardiovascular system: S1 & S2 heard, RRR.  Gastrointestinal system: Abdomen is diffusely tender.  Colostomy with minimal loose fecal matter.  Bowel sounds absent.  JP drain with serosanguineous drainage.  Incisions intact. Central nervous system: Alert and oriented.  Flat  affect.  Anxious. Extremities: Symmetric 5 x 5 power.    Data Reviewed: I have personally reviewed following labs and imaging studies  CBC: Recent Labs  Lab 03/28/22 1904 03/29/22 2355 03/31/22 0421  04/01/22 0605 04/02/22 0529 04/03/22 0517  WBC 13.4* 9.8 6.8 10.6* 13.3* 11.6*  NEUTROABS 9.7*  --   --   --   --   --   HGB 12.5 10.8* 10.0* 8.2* 7.3* 9.8*  HCT 38.1 33.6* 31.2* 26.1* 22.6* 29.8*  MCV 99.7 102.4* 101.6* 104.4* 100.9* 96.8  PLT 325 280 260 261 258 027   Basic Metabolic Panel: Recent Labs  Lab 03/29/22 2355 03/31/22 0421 04/01/22 0605 04/02/22 0529 04/03/22 0517  NA 135 135 136 135 134*  K 4.0 3.5 4.7 3.4* 3.2*  CL 100 99 101 100 98  CO2 26 27 21* 27 29  GLUCOSE 100* 81 122* 130* 129*  BUN '10 11 14 16 15  '$ CREATININE 0.94 1.01* 1.04* 0.99 0.81  CALCIUM 8.4* 8.1* 8.0* 8.1* 8.9  MG  --  2.3  --   --   --    GFR: Estimated Creatinine Clearance: 50.2 mL/min (by C-G formula based on SCr of 0.81 mg/dL). Liver Function Tests: Recent Labs  Lab 03/28/22 1904  AST 19  ALT 13  ALKPHOS 77  BILITOT 1.0  PROT 8.1  ALBUMIN 3.1*   Recent Labs  Lab 03/28/22 1904  LIPASE 25   No results for input(s): "AMMONIA" in the last 168 hours. Coagulation Profile: Recent Labs  Lab 03/30/22 0129  INR 1.2   Cardiac Enzymes: No results for input(s): "CKTOTAL", "CKMB", "CKMBINDEX", "TROPONINI" in the last 168 hours. BNP (last 3 results) No results for input(s): "PROBNP" in the last 8760 hours. HbA1C: No results for input(s): "HGBA1C" in the last 72 hours. CBG: Recent Labs  Lab 03/29/22 2355  GLUCAP 103*   Lipid Profile: No results for input(s): "CHOL", "HDL", "LDLCALC", "TRIG", "CHOLHDL", "LDLDIRECT" in the last 72 hours. Thyroid Function Tests: No results for input(s): "TSH", "T4TOTAL", "FREET4", "T3FREE", "THYROIDAB" in the last 72 hours. Anemia Panel: No results for input(s): "VITAMINB12", "FOLATE", "FERRITIN", "TIBC", "IRON", "RETICCTPCT" in the last 72 hours. Sepsis Labs: No results for input(s): "PROCALCITON", "LATICACIDVEN" in the last 168 hours.  No results found for this or any previous visit (from the past 240 hour(s)).       Radiology  Studies: DG Abd Portable 1V  Result Date: 04/03/2022 CLINICAL DATA:  76 year old female NG tube placement. EXAM: PORTABLE ABDOMEN - 1 VIEW COMPARISON:  0831 hours today and earlier. FINDINGS: Portable AP semi upright view at 1102 hours. Enteric tube placed into the stomach, left upper quadrant. Side hole projects at the level of the gastric body. Negative visible bowel gas pattern. Levoconvex thoracolumbar scoliosis. Negative lung bases. IMPRESSION: Satisfactory enteric tube placement into the stomach. Electronically Signed   By: Genevie Ann M.D.   On: 04/03/2022 11:10   DG Abd Portable 1V  Result Date: 04/03/2022 CLINICAL DATA:  Ileus following gastrointestinal surgery EXAM: PORTABLE ABDOMEN - 1 VIEW COMPARISON:  Portable exam 0831 hours compared to 01/26/2022 FINDINGS: Surgical drain in pelvis. Nonobstructive bowel gas pattern. No bowel dilatation or bowel wall thickening. Subcutaneous soft tissue gas within the abdominal wall bilaterally consistent with surgery. Ostomy LEFT lower quadrant. Bones demineralized with degenerative changes thoracolumbar spine. IMPRESSION: Nonobstructive bowel gas pattern. Electronically Signed   By: Lavonia Dana M.D.   On: 04/03/2022 09:06        Scheduled Meds:  acetaminophen  1,000 mg Oral Q6H   vitamin C  500 mg Oral BID   aspirin EC  81 mg Oral Daily   feeding supplement  237 mL Oral BID BM   gabapentin  100 mg Oral BID   levothyroxine  112 mcg Oral QAC breakfast   lidocaine  1 patch Transdermal Q24H   lip balm   Topical BID   methocarbamol  1,000 mg Oral Q6H   multivitamins with iron  1 tablet Oral Daily   Nintedanib  100 mg Oral Q12H   nitroGLYCERIN  0.5 inch Topical Q6H   polycarbophil  625 mg Oral BID   sodium chloride flush  3 mL Intravenous Q12H   Continuous Infusions:  sodium chloride     lactated ringers 125 mL/hr at 04/03/22 1150   ondansetron (ZOFRAN) IV     piperacillin-tazobactam (ZOSYN)  IV 3.375 g (04/03/22 0827)   promethazine  (PHENERGAN) injection (IM or IVPB) 12.5 mg (04/03/22 0429)     LOS: 5 days    Time spent: 37 minutes    Barb Merino, MD [facility] Pager [phone]

## 2022-04-03 NOTE — Care Management Obs Status (Signed)
MEDICARE OBSERVATION STATUS NOTIFICATION   Patient Details IM Letter given. Name: Alexandria [name] MRN: [id number] Date of Birth: 12-Nov-1945   Medicare Observation Status Notification Given:       Kerin Salen 04/03/2022, 2:44 PM

## 2022-04-04 ENCOUNTER — Inpatient Hospital Stay: Payer: Self-pay

## 2022-04-04 DIAGNOSIS — K5730 Diverticulosis of large intestine without perforation or abscess without bleeding: Secondary | ICD-10-CM | POA: Diagnosis not present

## 2022-04-04 LAB — CBC WITH DIFFERENTIAL/PLATELET
Abs Immature Granulocytes: 0.22 10*3/uL — ABNORMAL HIGH (ref 0.00–0.07)
Basophils Absolute: 0.1 10*3/uL (ref 0.0–0.1)
Basophils Relative: 1 %
Eosinophils Absolute: 0.4 10*3/uL (ref 0.0–0.5)
Eosinophils Relative: 6 %
HCT: 24.1 % — ABNORMAL LOW (ref 36.0–46.0)
Hemoglobin: 7.7 g/dL — ABNORMAL LOW (ref 12.0–15.0)
Immature Granulocytes: 3 %
Lymphocytes Relative: 17 %
Lymphs Abs: 1.2 10*3/uL (ref 0.7–4.0)
MCH: 32.1 pg (ref 26.0–34.0)
MCHC: 32 g/dL (ref 30.0–36.0)
MCV: 100.4 fL — ABNORMAL HIGH (ref 80.0–100.0)
Monocytes Absolute: 0.9 10*3/uL (ref 0.1–1.0)
Monocytes Relative: 14 %
Neutro Abs: 3.9 10*3/uL (ref 1.7–7.7)
Neutrophils Relative %: 59 %
Platelets: 265 10*3/uL (ref 150–400)
RBC: 2.4 MIL/uL — ABNORMAL LOW (ref 3.87–5.11)
RDW: 15.5 % (ref 11.5–15.5)
WBC: 6.6 10*3/uL (ref 4.0–10.5)
nRBC: 0 % (ref 0.0–0.2)

## 2022-04-04 LAB — CBC
HCT: 26.5 % — ABNORMAL LOW (ref 36.0–46.0)
Hemoglobin: 8.5 g/dL — ABNORMAL LOW (ref 12.0–15.0)
MCH: 32 pg (ref 26.0–34.0)
MCHC: 32.1 g/dL (ref 30.0–36.0)
MCV: 99.6 fL (ref 80.0–100.0)
Platelets: 341 10*3/uL (ref 150–400)
RBC: 2.66 MIL/uL — ABNORMAL LOW (ref 3.87–5.11)
RDW: 15.4 % (ref 11.5–15.5)
WBC: 7.8 10*3/uL (ref 4.0–10.5)
nRBC: 0.3 % — ABNORMAL HIGH (ref 0.0–0.2)

## 2022-04-04 LAB — GLUCOSE, CAPILLARY
Glucose-Capillary: 98 mg/dL (ref 70–99)
Glucose-Capillary: 168 mg/dL — ABNORMAL HIGH (ref 70–99)

## 2022-04-04 LAB — COMPREHENSIVE METABOLIC PANEL
ALT: 11 U/L (ref 0–44)
AST: 19 U/L (ref 15–41)
Albumin: <1.5 g/dL — ABNORMAL LOW (ref 3.5–5.0)
Alkaline Phosphatase: 44 U/L (ref 38–126)
Anion gap: 7 (ref 5–15)
BUN: 9 mg/dL (ref 8–23)
CO2: 26 mmol/L (ref 22–32)
Calcium: 7.4 mg/dL — ABNORMAL LOW (ref 8.9–10.3)
Chloride: 103 mmol/L (ref 98–111)
Creatinine, Ser: 0.68 mg/dL (ref 0.44–1.00)
GFR, Estimated: >60 mL/min (ref >60)
Glucose, Bld: 80 mg/dL (ref 70–99)
Potassium: 3.6 mmol/L (ref 3.5–5.1)
Sodium: 136 mmol/L (ref 135–145)
Total Bilirubin: 0.4 mg/dL (ref 0.3–1.2)
Total Protein: 4.5 g/dL — ABNORMAL LOW (ref 6.5–8.1)

## 2022-04-04 LAB — MAGNESIUM: Magnesium: 1.6 mg/dL — ABNORMAL LOW (ref 1.7–2.4)

## 2022-04-04 LAB — PHOSPHORUS: Phosphorus: 2 mg/dL — ABNORMAL LOW (ref 2.5–4.6)

## 2022-04-04 MED ORDER — MAGNESIUM SULFATE 2 GM/50ML IV SOLN
2.0000 g | Freq: Once | INTRAVENOUS | Status: AC
  Administered 2022-04-04: 2 g via INTRAVENOUS
  Filled 2022-04-04: qty 50

## 2022-04-04 MED ORDER — TRAVASOL 10 % IV SOLN
INTRAVENOUS | Status: AC
  Filled 2022-04-04: qty 480

## 2022-04-04 MED ORDER — CHLORHEXIDINE GLUCONATE CLOTH 2 % EX PADS
6.0000 | MEDICATED_PAD | Freq: Every day | CUTANEOUS | Status: DC
  Administered 2022-04-04 – 2022-04-21 (×17): 6 via TOPICAL

## 2022-04-04 MED ORDER — POTASSIUM PHOSPHATES 15 MMOLE/5ML IV SOLN
15.0000 mmol | Freq: Once | INTRAVENOUS | Status: AC
  Administered 2022-04-04: 15 mmol via INTRAVENOUS
  Filled 2022-04-04: qty 5

## 2022-04-04 MED ORDER — SODIUM CHLORIDE 0.9% FLUSH: 10.0000 mL | INTRAVENOUS | Status: DC | PRN

## 2022-04-04 MED ORDER — INSULIN ASPART 100 UNIT/ML IJ SOLN
0.0000 [IU] | Freq: Four times a day (QID) | INTRAMUSCULAR | Status: DC
  Administered 2022-04-05 (×2): 1 [IU] via SUBCUTANEOUS
  Administered 2022-04-05: 2 [IU] via SUBCUTANEOUS
  Administered 2022-04-05 – 2022-04-06 (×2): 1 [IU] via SUBCUTANEOUS
  Administered 2022-04-06: 2 [IU] via SUBCUTANEOUS
  Administered 2022-04-06 – 2022-04-07 (×4): 1 [IU] via SUBCUTANEOUS

## 2022-04-04 MED ORDER — FAMOTIDINE IN NACL 20-0.9 MG/50ML-% IV SOLN
20.0000 mg | Freq: Two times a day (BID) | INTRAVENOUS | Status: DC
  Administered 2022-04-04 – 2022-04-15 (×24): 20 mg via INTRAVENOUS
  Filled 2022-04-04 (×24): qty 50

## 2022-04-04 MED ORDER — DEXTROSE-NACL 5-0.45 % IV SOLN: INTRAVENOUS | Status: AC

## 2022-04-04 MED ORDER — ACETAMINOPHEN 10 MG/ML IV SOLN
1000.0000 mg | Freq: Four times a day (QID) | INTRAVENOUS | Status: AC
  Administered 2022-04-04 – 2022-04-05 (×4): 1000 mg via INTRAVENOUS
  Filled 2022-04-04 (×4): qty 100

## 2022-04-04 MED ORDER — DIPHENHYDRAMINE HCL 50 MG/ML IJ SOLN
12.5000 mg | Freq: Three times a day (TID) | INTRAMUSCULAR | Status: DC | PRN
  Administered 2022-04-05 – 2022-04-11 (×2): 12.5 mg via INTRAVENOUS
  Filled 2022-04-04 (×2): qty 1

## 2022-04-04 MED ORDER — KCL IN DEXTROSE-NACL 20-5-0.45 MEQ/L-%-% IV SOLN
INTRAVENOUS | Status: AC
  Filled 2022-04-04: qty 1000

## 2022-04-04 MED ORDER — HYDROMORPHONE HCL 1 MG/ML IJ SOLN
0.5000 mg | INTRAMUSCULAR | Status: DC | PRN
  Administered 2022-04-04 – 2022-04-05 (×6): 1 mg via INTRAVENOUS
  Administered 2022-04-05: 0.5 mg via INTRAVENOUS
  Administered 2022-04-06 – 2022-04-07 (×7): 1 mg via INTRAVENOUS
  Filled 2022-04-04 (×14): qty 1

## 2022-04-04 MED ORDER — METHOCARBAMOL 1000 MG/10ML IJ SOLN
500.0000 mg | Freq: Three times a day (TID) | INTRAVENOUS | Status: DC
  Administered 2022-04-04 – 2022-04-08 (×13): 500 mg via INTRAVENOUS
  Filled 2022-04-04 (×6): qty 500
  Filled 2022-04-04: qty 5
  Filled 2022-04-04 (×6): qty 500
  Filled 2022-04-04 (×3): qty 5
  Filled 2022-04-04: qty 500

## 2022-04-04 MED ORDER — THIAMINE HCL 100 MG/ML IJ SOLN
100.0000 mg | Freq: Every day | INTRAMUSCULAR | Status: DC
  Administered 2022-04-04 – 2022-04-05 (×2): 100 mg via INTRAVENOUS
  Filled 2022-04-04 (×2): qty 2

## 2022-04-04 MED ORDER — SODIUM CHLORIDE 0.9% FLUSH
10.0000 mL | Freq: Two times a day (BID) | INTRAVENOUS | Status: DC
  Administered 2022-04-05: 10 mL

## 2022-04-04 NOTE — Progress Notes (Signed)
PHARMACY - TOTAL PARENTERAL NUTRITION CONSULT NOTE   Indication: Prolonged ileus  Patient Measurements: Height: '5\' 4"'$  (162.6 cm) Weight: 63 kg (138 lb 14.2 oz) IBW/kg (Calculated) : 54.7 TPN AdjBW (KG): 63 Body mass index is 23.84 kg/m. Usual Weight:   Assessment:  Pharmacy is consulted to initiate TPN in 76 yo female with prolonged ileus. Pt  is POD 4 for robotic lower anterior rectosigmoid resection with colostomy and drainage of pelvic abscess by Dr. [name] 12/26.   Glucose / Insulin:  - No history of DM  - Target glucose <150. Has been at goal on BMP Electrolytes:  - Potassium is on lower end at 3.6, magnesium is low at 1.6. Phosphorus is low at 2.0 - Other are WNL including CorrCa at 9.4  Renal:  - SCr < 1, BUN 9  Hepatic:  - WNL  Intake / Output; MIVF:  - UOP not charted,1850 mL of NG output  - (-)1350 ml I/O on 12/29  - D51/2 NS with 20 meq/L of KCl at 100 ml/hr  GI Imaging: - 12/24: Current findings are consistent with complicated sigmoid diverticulitis. Distal sigmoid colon shows wall thickening and adjacent inflammation. GI Surgeries / Procedures:  - 12/26: robotic lower anterior rectosigmoid resection with colostomy and drainage of pelvic abscess   Central access: plan to place 12/30  TPN start date:  12/30   Nutritional Goals: Goal TPN rate is --- mL/hr (provides --- g of protein and --- kcals per day)  RD Assessment: - pending     Current Nutrition:  NPO  Plan:  Magnesium sulfate 2 gr IV x1  Potassium phosphate 15 mmol x1    Start TPN at 11m/hr at 1800 Electrolytes in TPN:  Na 524m/L  K 5049mL  Ca 5mE81m  Mg 5mEq9m Phos 15mmo58m Cl:Ac 1:1 Add standard MVI and trace elements to TPN Initiate Sensitive q6h SSI and adjust as needed  Reduce MIVF to 60 mL/hr at 1800. Will remove KCl from MIVF  Monitor TPN labs on Mon/Thurs  NikolaRoyetta AsalmD, BCPS 04/04/2022 10:34 AM

## 2022-04-04 NOTE — Progress Notes (Signed)
Initial Nutrition Assessment  DOCUMENTATION CODES:    (High Nutrition Risk)  INTERVENTION:   TPN to meet 100% nutritional needs: TPN order per Pharmacy  Monitor magnesium, potassium, and phosphorus BID for at least 3 days, MD to replete as needed, as pt is at risk for refeeding syndrome given prolonged inadequate intake.  Add IV thiamine 100 mg daily x 5 days. First dose today outside of TPN but ok for Pharmacist to add to TPN on subsequent days  Need new weight as last weight from 12/26; Daily weights while on TPN   NUTRITION DIAGNOSIS:   Inadequate oral intake related to altered GI function, acute illness as evidenced by NPO status.  GOAL:   Patient will meet greater than or equal to 90% of their needs  MONITOR:   Diet advancement, Labs, Weight trends, Skin, I & O's (TPN tolerance)  REASON FOR ASSESSMENT:   Consult New TPN/TNA  ASSESSMENT:   76 yo female admitted with diverticulitis with pelvic abscess requiring surgery, developed post op ileus and requiring initiation of TPN. PMH includes CHF, HTN, pulmonary fibrosis  12/24 Admit 12/26 OR: rectosigmoid stricture with perforation and abscess with diverticulitis requiring rectosigmoid resection, colostomy and pelvic abscess drainage 12/29 N/V on [place] diet, NG inserted, NPO 12/30 PICC line placed, TPN initiated  NPO, NG tube in place, no real ostomy output per Surgery Noted plan for PICC placement today.   NG with 1850 mL in 24 hours, 250 mL thus far today  PO intake on liquid diet, 0-50% Prior to admission, pt reporting ongoing issues with abd pain, anorexia, constipation, rectal bleeding and rectal pain starting around May  [place] has ordered TPN to start tonight at 1800 at rate of 40 ml/hr  Noted D5-1/2NS at 100 ml/hr and to be reduced to 60 ml/hr when TPN initiated  At baseline, pt is very active, tries to walk 2 miles daily.   Last weight 12/26, 63 kg. Place orders for new weight today in addition  to daily weights while on TPN Based on current weight encounters, it does not appear that pt has experienced any recent wt loss. New weight pending however  Phosphorus low but being addressed prior to initiation of TPN. Noted receiving mag sulfate for low mag as well  Labs: sodium 136 (wdl), potassium 3.6 (wdl), phosphorus 2.0 (L), corrected calcium >/= 9.5 (albumin <1.5), Creatinine wdl Meds: potassium phosphate, mag sulfate  NUTRITION - FOCUSED PHYSICAL EXAM:  Unable to assess  Diet Order:   Diet Order             Diet NPO time specified Except for: Ice Chips, Sips with Meds  Diet effective now                   EDUCATION NEEDS:   Not appropriate for education at this time  Skin:  Skin Assessment: Skin Integrity Issues: Skin Integrity Issues:: Incisions Incisions: abdominal, new colostomy  Last BM:  new ostomy, no "real" output yet per Surgery  Height:   Ht Readings from Last 1 Encounters:  03/31/22 '5\' 4"'$  (1.626 m)    Weight:   Wt Readings from Last 1 Encounters:  03/31/22 63 kg    BMI:  Body mass index is 23.84 kg/m.  Estimated Nutritional Needs:   Kcal:  1700-1900 kcals  Protein:  85-100 g  Fluid:  >/= 1.7 L   Kerman Passey MS, RDN, LDN, CNSC Registered Dietitian 3 Clinical Nutrition RD Pager and On-Call Pager Number Located in [place]

## 2022-04-04 NOTE — Progress Notes (Signed)
4 Days Post-Op   Subjective/Chief Complaint: Ng in, still sore, no real ostomy output   Objective: Vital signs in last 24 hours: Temp:  [98.2 F (36.8 C)-98.8 F (37.1 C)] 98.8 F (37.1 C) (12/30 0414) Pulse Rate:  [66-83] 79 (12/30 0414) Resp:  [16-18] 16 (12/30 0414) BP: (130-182)/(65-78) 134/73 (12/30 0414) SpO2:  [95 %-98 %] 95 % (12/30 0414) Last BM Date : 04/03/22  Intake/Output from previous day: 12/29 0701 - 12/30 0700 In: 2379.4 [P.O.:180; I.V.:1821.3; IV Piggyback:378.1] Out: 3730 [Urine:1600; Emesis/NG output:1850; Drains:50; Stool:230] Intake/Output this shift: No intake/output data recorded.  Much more awake Pulm effort normal Cv regular Abd less distended, incisions clean, jp serosang, no output in stoma pouch  Lab Results:  Recent Labs    04/03/22 0517 04/04/22 0611  WBC 11.6* 6.6  HGB 9.8* 7.7*  HCT 29.8* 24.1*  PLT 300 265   BMET Recent Labs    04/03/22 0517 04/04/22 0611  NA 134* 136  K 3.2* 3.6  CL 98 103  CO2 29 26  GLUCOSE 129* 80  BUN 15 9  CREATININE 0.81 0.68  CALCIUM 8.9 7.4*   PT/INR No results for input(s): "LABPROT", "INR" in the last 72 hours. ABG No results for input(s): "PHART", "HCO3" in the last 72 hours.  Invalid input(s): "PCO2", "PO2"  Studies/Results: [place] EKG SITE RITE  Result Date: 04/04/2022 If Site Rite image not attached, placement could not be confirmed due to current cardiac rhythm.  DG Abd Portable 1V  Result Date: 04/03/2022 CLINICAL DATA:  76 year old female NG tube placement. EXAM: PORTABLE ABDOMEN - 1 VIEW COMPARISON:  0831 hours today and earlier. FINDINGS: Portable AP semi upright view at 1102 hours. Enteric tube placed into the stomach, left upper quadrant. Side hole projects at the level of the gastric body. Negative visible bowel gas pattern. Levoconvex thoracolumbar scoliosis. Negative lung bases. IMPRESSION: Satisfactory enteric tube placement into the stomach. Electronically Signed   By: Genevie Ann M.D.   On: 04/03/2022 11:10   DG Abd Portable 1V  Result Date: 04/03/2022 CLINICAL DATA:  Ileus following gastrointestinal surgery EXAM: PORTABLE ABDOMEN - 1 VIEW COMPARISON:  Portable exam 0831 hours compared to 01/26/2022 FINDINGS: Surgical drain in pelvis. Nonobstructive bowel gas pattern. No bowel dilatation or bowel wall thickening. Subcutaneous soft tissue gas within the abdominal wall bilaterally consistent with surgery. Ostomy LEFT lower quadrant. Bones demineralized with degenerative changes thoracolumbar spine. IMPRESSION: Nonobstructive bowel gas pattern. Electronically Signed   By: Lavonia Dana M.D.   On: 04/03/2022 09:06    Anti-infectives: Anti-infectives (From admission, onward)    Start     Dose/Rate Route Frequency Ordered Stop   03/31/22 1045  cefoTEtan (CEFOTAN) 2 g in sodium chloride 0.9 % 100 mL IVPB        2 g 200 mL/hr over 30 Minutes Intravenous On call to O.R. 03/31/22 0959 03/31/22 1225   03/29/22 0800  piperacillin-tazobactam (ZOSYN) IVPB 3.375 g        3.375 g 12.5 mL/hr over 240 Minutes Intravenous Every 8 hours 03/29/22 0746 04/05/22 1621       Assessment/Plan: Likely diverticulitis with pelvic abscess POD#4 s/p robotic lower anterior rectosigmoid resection with colostomy and drainage of pelvic abscess by Dr. [name] 12/26 - OR findings of rectosigmoid stricture with perforation & abscess, probable diverticulitis  -pathology is diverticulitis no malignancy - NG in and feels better, still clniically has ileus -will start TPN today with PICC line given ntn parameters, length of time and ileus -  WBC normal today Continue IV antibiotics for five days postop - Hct 22.6 up to 29.8 after one unit yesterday now back down to 24.1.  hold enoxaparin. Will recheck later today.  - Continue drain. May be able to remove prior to discharge - WOC following for new ostomy - overall doing well with it. She will need a little more teaching prior to dc   [id number] TPN ID:  [email or web address] VTE: lovenox   Chronic diastolic HF Hypothyroidism Pulmonary fibrosis HTN   Rolm Bookbinder 04/04/2022

## 2022-04-04 NOTE — Progress Notes (Signed)
PICC order received, line will be placed this afternoon for TPN.

## 2022-04-04 NOTE — Progress Notes (Signed)
Patient has been having the urge to urinate, but has not been able to do so on her own. Orders to I&O cath PRN and to insert foley if I&O occurs twice. MD order given to insert foley cath today if patient not able to void. Patient is feeling better today and was able to get up with little pain to [facility], but was still unable to void. Foley cath inserted.

## 2022-04-04 NOTE — Progress Notes (Signed)
Peripherally Inserted Central Catheter Placement  The IV Nurse has discussed with the patient and/or persons authorized to consent for the patient, the purpose of this procedure and the potential benefits and risks involved with this procedure.  The benefits include less needle sticks, lab draws from the catheter, and the patient may be discharged home with the catheter. Risks include, but not limited to, infection, bleeding, blood clot (thrombus formation), and puncture of an artery; nerve damage and irregular heartbeat and possibility to perform a PICC exchange if needed/ordered by physician.  Alternatives to this procedure were also discussed.  Bard Power PICC patient education guide, fact sheet on infection prevention and patient information card has been provided to patient /or left at bedside.    PICC Placement Documentation  PICC Double Lumen 04/04/22 Right Brachial 34 cm 1 cm (Active)  Indication for Insertion or Continuance of Line Administration of hyperosmolar/irritating solutions (i.e. TPN, Vancomycin, etc.) 04/04/22 1707  Exposed Catheter (cm) 0 cm 04/04/22 1707  Site Assessment Clean, Dry, Intact 04/04/22 1707  Lumen #1 Status Saline locked;Flushed;Blood return noted 04/04/22 1707  Lumen #2 Status Saline locked;Flushed;Blood return noted 04/04/22 1707  Dressing Type Transparent;Securing device 04/04/22 1707  Dressing Status Antimicrobial disc in place;Clean, Dry, Intact 04/04/22 1707  Safety Lock Not Applicable 28/41/32 4401  Line Care Connections checked and tightened 04/04/22 1707  Line Adjustment (NICU/IV Team Only) No 04/04/22 1707  Dressing Intervention New dressing 04/04/22 1707  Dressing Change Due 05/09/22 04/04/22 [place], Iliyana Convey Chenice 04/04/2022, 5:08 PM

## 2022-04-04 NOTE — Progress Notes (Signed)
PROGRESS NOTE    Alexandria [name]  TIW:[id number] DOB: [birth year] DOA: 03/28/2022 PCP: [name], DO    Brief Narrative:  Alexandria [name] is a 76 y/o with a history of hypothyroidism, grade I DD, HTN, IPF, diverticulosis, diverticulitis, DVT, chronic hypokalemia who has a long history of constipation and has recently tried Linzess but stopped after 2 weeks due to lack of efficacy by her report. She recently got CT abd/pelvis 03/20/22 which revealed an enhancing lesion/mass just below the sigmoid-rectal junction. Flex Sig was performed 03/20/22 which revealed a polypoid mass at the sigmoid-rectal junction. Bx was readout by pathology as an inflammatory poly w/o dysplasia or malignancy. Patient had MRI abd/pelvis 03/26/22 -showed severe sigmoid diverticulitis.  Due to continued constipation and diffuse abdominal pain, worst in the LUQ, she presented to [facility] for evaluation. General surgery was consulted. Patient underwent repeat CT abd/pelvis and thought to have complicated diverticulitis with abscess. Unable to drain per IR.  Ultimately, she underwent rectosigmoid resection with colostomy by Dr. [name] 12/26.  With poor recovery postop.     Assessment & Plan:   Complicated sigmoid diverticulitis with pelvic abscess: Rectosigmoid resection and colostomy Dr. [name] 12/26.  IV fluids.  IV Zosyn.  Biopsy with evidence of diverticulitis with pericolonic abscess and perforation.  No malignancy. 12/29, nausea and vomiting.  NG tube inserted.  NPO.  Mobilize.  Adequate pain medications.  Replace electrolytes.  Surgery following. Prolonged n.p.o.  Starting on TPN today.  TPN to be managed by pharmacy.  Electrolyte replacement to be managed by pharmacy with TPN.  Symptomatic anemia due to postop blood loss: Previous hemoglobin 12-13.  Hemoglobin dropped to 7.3-1 unit of PRBC-9.8-7.7.  Recheck tomorrow.  Today no indication for further transfusion.  Chronic medical issues  including Chronic diastolic congestive heart failure, diuresis on hold.  On maintenance IV fluids. Hypothyroidism, can take Synthroid clamping NG tube. Idiopathic pulmonary fibrosis: Without exacerbation. Hypokalemia: Replaced IV. Hypomagnesemia: Replaced. Hypophosphatemia: Replaced  Urinary retention: Secondary to pelvic pain and immobility.  Frequent straight catheterization.  Insert Foley today.   DVT prophylaxis: Place and maintain sequential compression device Start: 03/30/22 0940   Code Status: Full code Family Communication: None at the bedside Disposition Plan: Status is: Inpatient Remains inpatient appropriate because: Significant abdominal pain and distention     Consultants:  General surgery  Procedures:  Rectosigmoid resection and colostomy  Antimicrobials:  IV Zosyn 12/23---   Subjective:  Patient seen and examined.  Slightly more awake than yesterday.  Complains of moderate abdominal pain.  Nausea vomiting relieved with NG tube decompression.  Urine retention remains the issue.  She is more awake and interactive today but feels very weak.  Objective: Vitals:   04/03/22 1048 04/03/22 1454 04/03/22 2012 04/04/22 0414  BP: (!) 165/76 130/69 (!) 148/65 134/73  Pulse: 74 78 83 79  Resp:  '16 16 16  '$ Temp:  98.2 F (36.8 C) 98.3 F (36.8 C) 98.8 F (37.1 C)  TempSrc:  Oral Oral Oral  SpO2: 96% 96% 98% 95%  Weight:      Height:        Intake/Output Summary (Last 24 hours) at 04/04/2022 1122 Last data filed at 04/04/2022 0805 Gross per 24 hour  Intake 2256.45 ml  Output 3530 ml  Net -1273.55 ml   Filed Weights   03/28/22 1738 03/31/22 1057  Weight: 63 kg 63 kg    Examination:  General exam: Appears comfortable.  In mild distress and anxious. Respiratory system: Clear  to auscultation. Respiratory effort normal.  No added sounds. Cardiovascular system: S1 & S2 heard, RRR.  Gastrointestinal system: Abdomen is diffusely tender.  Colostomy with loose  fecal matter.  Bowel sounds sluggish.  JP drain with serosanguineous drainage.  Incisions intact.  NG tube with thin biliary drain. Central nervous system: Alert and oriented.  Anxious. Extremities: Symmetric 5 x 5 power.    Data Reviewed: I have personally reviewed following labs and imaging studies  CBC: Recent Labs  Lab 03/28/22 1904 03/29/22 2355 03/31/22 0421 04/01/22 8119 04/02/22 0529 04/03/22 0517 04/04/22 0611  WBC 13.4*   < > 6.8 10.6* 13.3* 11.6* 6.6  NEUTROABS 9.7*  --   --   --   --   --  3.9  HGB 12.5   < > 10.0* 8.2* 7.3* 9.8* 7.7*  HCT 38.1   < > 31.2* 26.1* 22.6* 29.8* 24.1*  MCV 99.7   < > 101.6* 104.4* 100.9* 96.8 100.4*  PLT 325   < > 260 261 258 300 265   < > = values in this interval not displayed.   Basic Metabolic Panel: Recent Labs  Lab 03/31/22 0421 04/01/22 0605 04/02/22 0529 04/03/22 0517 04/04/22 0611  NA 135 136 135 134* 136  K 3.5 4.7 3.4* 3.2* 3.6  CL 99 101 100 98 103  CO2 27 21* '27 29 26  '$ GLUCOSE 81 122* 130* 129* 80  BUN '11 14 16 15 9  '$ CREATININE 1.01* 1.04* 0.99 0.81 0.68  CALCIUM 8.1* 8.0* 8.1* 8.9 7.4*  MG 2.3  --   --   --  1.6*  PHOS  --   --   --   --  2.0*   GFR: Estimated Creatinine Clearance: 50.9 mL/min (by C-G formula based on SCr of 0.68 mg/dL). Liver Function Tests: Recent Labs  Lab 03/28/22 1904 04/04/22 0611  AST 19 19  ALT 13 11  ALKPHOS 77 44  BILITOT 1.0 0.4  PROT 8.1 4.5*  ALBUMIN 3.1* <1.5*   Recent Labs  Lab 03/28/22 1904  LIPASE 25   No results for input(s): "AMMONIA" in the last 168 hours. Coagulation Profile: Recent Labs  Lab 03/30/22 0129  INR 1.2   Cardiac Enzymes: No results for input(s): "CKTOTAL", "CKMB", "CKMBINDEX", "TROPONINI" in the last 168 hours. BNP (last 3 results) No results for input(s): "PROBNP" in the last 8760 hours. HbA1C: No results for input(s): "HGBA1C" in the last 72 hours. CBG: Recent Labs  Lab 03/29/22 2355  GLUCAP 103*   Lipid Profile: No results for  input(s): "CHOL", "HDL", "LDLCALC", "TRIG", "CHOLHDL", "LDLDIRECT" in the last 72 hours. Thyroid Function Tests: No results for input(s): "TSH", "T4TOTAL", "FREET4", "T3FREE", "THYROIDAB" in the last 72 hours. Anemia Panel: No results for input(s): "VITAMINB12", "FOLATE", "FERRITIN", "TIBC", "IRON", "RETICCTPCT" in the last 72 hours. Sepsis Labs: No results for input(s): "PROCALCITON", "LATICACIDVEN" in the last 168 hours.  No results found for this or any previous visit (from the past 240 hour(s)).       Radiology Studies: [place] EKG SITE RITE  Result Date: 04/04/2022 If Site Rite image not attached, placement could not be confirmed due to current cardiac rhythm.  DG Abd Portable 1V  Result Date: 04/03/2022 CLINICAL DATA:  76 year old female NG tube placement. EXAM: PORTABLE ABDOMEN - 1 VIEW COMPARISON:  0831 hours today and earlier. FINDINGS: Portable AP semi upright view at 1102 hours. Enteric tube placed into the stomach, left upper quadrant. Side hole projects at the level of the gastric  body. Negative visible bowel gas pattern. Levoconvex thoracolumbar scoliosis. Negative lung bases. IMPRESSION: Satisfactory enteric tube placement into the stomach. Electronically Signed   By: Genevie Ann M.D.   On: 04/03/2022 11:10   DG Abd Portable 1V  Result Date: 04/03/2022 CLINICAL DATA:  Ileus following gastrointestinal surgery EXAM: PORTABLE ABDOMEN - 1 VIEW COMPARISON:  Portable exam 0831 hours compared to 01/26/2022 FINDINGS: Surgical drain in pelvis. Nonobstructive bowel gas pattern. No bowel dilatation or bowel wall thickening. Subcutaneous soft tissue gas within the abdominal wall bilaterally consistent with surgery. Ostomy LEFT lower quadrant. Bones demineralized with degenerative changes thoracolumbar spine. IMPRESSION: Nonobstructive bowel gas pattern. Electronically Signed   By: Lavonia Dana M.D.   On: 04/03/2022 09:06        Scheduled Meds:  vitamin C  500 mg Oral BID   aspirin EC  81  mg Oral Daily   insulin aspart  0-9 Units Subcutaneous Q6H   levothyroxine  112 mcg Oral QAC breakfast   lidocaine  1 patch Transdermal Q24H   lip balm   Topical BID   Nintedanib  100 mg Oral Q12H   nitroGLYCERIN  0.5 inch Topical Q6H   sodium chloride flush  3 mL Intravenous Q12H   Continuous Infusions:  sodium chloride     acetaminophen 1,000 mg (04/04/22 0815)   dextrose 5 % and 0.45 % NaCl with KCl 20 mEq/L 100 mL/hr at 04/04/22 0814   dextrose 5 % and 0.45% NaCl     famotidine (PEPCID) IV 20 mg (04/04/22 0930)   magnesium sulfate bolus IVPB     methocarbamol (ROBAXIN) IV 500 mg (04/04/22 0933)   ondansetron (ZOFRAN) IV     piperacillin-tazobactam (ZOSYN)  IV 3.375 g (04/04/22 0813)   potassium PHOSPHATE IVPB (in mmol)     promethazine (PHENERGAN) injection (IM or IVPB) 12.5 mg (04/03/22 0429)   TPN ADULT (ION)       LOS: 6 days    Time spent: 35 minutes    Barb Merino, MD [facility] Pager [phone]

## 2022-04-05 ENCOUNTER — Inpatient Hospital Stay (HOSPITAL_COMMUNITY): Payer: Medicare Other

## 2022-04-05 DIAGNOSIS — K5730 Diverticulosis of large intestine without perforation or abscess without bleeding: Secondary | ICD-10-CM | POA: Diagnosis not present

## 2022-04-05 LAB — CBC
HCT: 28.3 % — ABNORMAL LOW (ref 36.0–46.0)
Hemoglobin: 9 g/dL — ABNORMAL LOW (ref 12.0–15.0)
MCH: 31.8 pg (ref 26.0–34.0)
MCHC: 31.8 g/dL (ref 30.0–36.0)
MCV: 100 fL (ref 80.0–100.0)
Platelets: 309 10*3/uL (ref 150–400)
RBC: 2.83 MIL/uL — ABNORMAL LOW (ref 3.87–5.11)
RDW: 15 % (ref 11.5–15.5)
WBC: 12 10*3/uL — ABNORMAL HIGH (ref 4.0–10.5)
nRBC: 0 % (ref 0.0–0.2)

## 2022-04-05 LAB — PHOSPHORUS: Phosphorus: 2.2 mg/dL — ABNORMAL LOW (ref 2.5–4.6)

## 2022-04-05 LAB — COMPREHENSIVE METABOLIC PANEL
ALT: 13 U/L (ref 0–44)
AST: 20 U/L (ref 15–41)
Albumin: 1.8 g/dL — ABNORMAL LOW (ref 3.5–5.0)
Alkaline Phosphatase: 46 U/L (ref 38–126)
Anion gap: 6 (ref 5–15)
BUN: 8 mg/dL (ref 8–23)
CO2: 28 mmol/L (ref 22–32)
Calcium: 7.5 mg/dL — ABNORMAL LOW (ref 8.9–10.3)
Chloride: 102 mmol/L (ref 98–111)
Creatinine, Ser: 0.64 mg/dL (ref 0.44–1.00)
GFR, Estimated: >60 mL/min (ref >60)
Glucose, Bld: 132 mg/dL — ABNORMAL HIGH (ref 70–99)
Potassium: 3.5 mmol/L (ref 3.5–5.1)
Sodium: 136 mmol/L (ref 135–145)
Total Bilirubin: 0.6 mg/dL (ref 0.3–1.2)
Total Protein: 5.2 g/dL — ABNORMAL LOW (ref 6.5–8.1)

## 2022-04-05 LAB — GLUCOSE, CAPILLARY
Glucose-Capillary: 132 mg/dL — ABNORMAL HIGH (ref 70–99)
Glucose-Capillary: 145 mg/dL — ABNORMAL HIGH (ref 70–99)
Glucose-Capillary: 147 mg/dL — ABNORMAL HIGH (ref 70–99)
Glucose-Capillary: 165 mg/dL — ABNORMAL HIGH (ref 70–99)

## 2022-04-05 LAB — TRIGLYCERIDES: Triglycerides: 78 mg/dL (ref <150)

## 2022-04-05 LAB — MAGNESIUM: Magnesium: 2.2 mg/dL (ref 1.7–2.4)

## 2022-04-05 MED ORDER — LEVOTHYROXINE SODIUM 100 MCG/5ML IV SOLN
84.0000 ug | Freq: Every day | INTRAVENOUS | Status: DC
  Administered 2022-04-09 – 2022-04-16 (×8): 84 ug via INTRAVENOUS
  Filled 2022-04-05 (×9): qty 5

## 2022-04-05 MED ORDER — POTASSIUM PHOSPHATES 15 MMOLE/5ML IV SOLN
20.0000 mmol | Freq: Once | INTRAVENOUS | Status: AC
  Administered 2022-04-05: 20 mmol via INTRAVENOUS
  Filled 2022-04-05: qty 6.67

## 2022-04-05 MED ORDER — PIPERACILLIN-TAZOBACTAM 3.375 G IVPB
3.3750 g | Freq: Three times a day (TID) | INTRAVENOUS | Status: AC
  Administered 2022-04-05 – 2022-04-10 (×15): 3.375 g via INTRAVENOUS
  Filled 2022-04-05 (×15): qty 50

## 2022-04-05 MED ORDER — LEVOTHYROXINE SODIUM 100 MCG/5ML IV SOLN: 75.0000 ug | Freq: Every day | INTRAVENOUS | Status: DC

## 2022-04-05 MED ORDER — TRAVASOL 10 % IV SOLN
INTRAVENOUS | Status: AC
  Filled 2022-04-05: qty 720

## 2022-04-05 MED ORDER — DEXTROSE-NACL 5-0.45 % IV SOLN: INTRAVENOUS | Status: AC

## 2022-04-05 MED ORDER — ACETAMINOPHEN 10 MG/ML IV SOLN
1000.0000 mg | Freq: Four times a day (QID) | INTRAVENOUS | Status: AC
  Administered 2022-04-05 – 2022-04-06 (×4): 1000 mg via INTRAVENOUS
  Filled 2022-04-05 (×4): qty 100

## 2022-04-05 NOTE — Progress Notes (Signed)
PROGRESS NOTE    Alexandria [name]  GDJ:[id number] DOB: [birth year] DOA: 03/28/2022 PCP: [name], DO    Brief Narrative:  Alexandria [name] is a 76 y/o with a history of hypothyroidism, grade I DD, HTN, IPF, diverticulosis, diverticulitis, DVT, chronic hypokalemia who has a long history of constipation and has recently tried Linzess but stopped after 2 weeks due to lack of efficacy by her report. She recently got CT abd/pelvis 03/20/22 which revealed an enhancing lesion/mass just below the sigmoid-rectal junction. Flex Sig was performed 03/20/22 which revealed a polypoid mass at the sigmoid-rectal junction. Bx was readout by pathology as an inflammatory poly w/o dysplasia or malignancy. Patient had MRI abd/pelvis 03/26/22 -showed severe sigmoid diverticulitis.  Due to continued constipation and diffuse abdominal pain, worst in the LUQ, she presented to [facility] for evaluation. General surgery was consulted. Patient underwent repeat CT abd/pelvis and thought to have complicated diverticulitis with abscess. Unable to drain per IR.  Ultimately, she underwent rectosigmoid resection with colostomy by Dr. [name] 12/26.  With poor recovery postop.     Assessment & Plan:   Complicated sigmoid diverticulitis with pelvic abscess: Rectosigmoid resection and colostomy Dr. [name] 12/26.  IV fluids.  On IV Zosyn.  Will extend therapy.  Biopsy with evidence of diverticulitis with pericolonic abscess and perforation.  No malignancy. 12/29, nausea and vomiting.  NG tube inserted.  NPO.  Mobilize.  Adequate pain medications.  Replace electrolytes.  Surgery following. Prolonged n.p.o. currently on TPN. TPN to be managed by pharmacy.  Electrolyte replacement to be managed by pharmacy with TPN.  Symptomatic anemia due to postop blood loss: Previous hemoglobin 12-13.  Hemoglobin dropped to 7.3-1 unit of PRBC-9.8-7.7-8.5 Recheck today.  Today no indication for further transfusion.  Hospital-acquired  delirium: Patient is developing impulsiveness and occasional confusion.  Likely related to hospitalization, medications and severe medical illness. Unable to minimize narcotics.  Minimize benzodiazepines.  Frequent reorientation.  Mobility.  Encouraged family to visit if able.  Chronic medical issues including Chronic diastolic congestive heart failure, diuresis on hold.  On maintenance IV fluids.  Euvolemic. Hypothyroidism, unable to take Synthroid.  Will change to IV. Idiopathic pulmonary fibrosis: Without exacerbation. Hypokalemia: Replaced  Hypomagnesemia: Replaced. Hypophosphatemia: Replaced  Urinary retention: Secondary to pelvic pain and immobility.  Continue Foley catheter until improvement of mobility.  Rectal bleeding and secretions: Anticipated from remaining rectal stump and inflammation.  Monitor.   DVT prophylaxis: Place and maintain sequential compression device Start: 03/30/22 0940   Code Status: Full code Family Communication: Son on the phone. Disposition Plan: Status is: Inpatient Remains inpatient appropriate because: Significant abdominal pain and distention, recovery from surgery.     Consultants:  General surgery  Procedures:  Rectosigmoid resection and colostomy  Antimicrobials:  IV Zosyn 12/23---   Subjective:  Patient seen and examined.  She was able to get out of the bed with the help of nurse technician.  Patient insisted on going home.  She did not know she is at [facility] long hospital.  Became impulsive but able to be oriented. Overnight events noted.  She had thin serous bleeding through the rectum.  Complains of belly pain but very inconsistent giving history. NG tube output about 900 mL.  She does have stool in her colostomy.  Objective: Vitals:   04/04/22 2116 04/05/22 0210 04/05/22 0428 04/05/22 0654  BP: (!) 141/78 (!) 157/67 (!) 168/70 (!) 174/82  Pulse: 64 76 70 87  Resp: '16 16 16   '$ Temp: 97.8  F (36.6 C) 98.2 F (36.8 C) 98.1 F  (36.7 C)   TempSrc: Oral Oral Oral   SpO2: 95% 97% 97%   Weight:      Height:        Intake/Output Summary (Last 24 hours) at 04/05/2022 1146 Last data filed at 04/05/2022 0947 Gross per 24 hour  Intake 1638.18 ml  Output 3528 ml  Net -1889.82 ml   Filed Weights   03/28/22 1738 03/31/22 1057  Weight: 63 kg 63 kg    Examination:  General exam: Appears comfortable while quiet and calm..  In mild distress and anxious and impulsive intermittently. Respiratory system: Clear to auscultation. Respiratory effort normal.  No added sounds. Cardiovascular system: S1 & S2 heard, RRR.  Gastrointestinal system: Abdomen is diffusely tender.  Colostomy with fecal matter.  Bowel sounds sluggish.  JP drain with serosanguineous drainage.  Incisions intact.  NG tube with thin biliary drain. Central nervous system: Alert and oriented x 1-2.  Impulsive.  Denies any delusions or hallucinations. Extremities: Symmetric 5 x 5 power.    Data Reviewed: I have personally reviewed following labs and imaging studies  CBC: Recent Labs  Lab 04/01/22 0605 04/02/22 0529 04/03/22 0517 04/04/22 0611 04/04/22 1431  WBC 10.6* 13.3* 11.6* 6.6 7.8  NEUTROABS  --   --   --  3.9  --   HGB 8.2* 7.3* 9.8* 7.7* 8.5*  HCT 26.1* 22.6* 29.8* 24.1* 26.5*  MCV 104.4* 100.9* 96.8 100.4* 99.6  PLT 261 258 300 265 315   Basic Metabolic Panel: Recent Labs  Lab 03/31/22 0421 04/01/22 0605 04/02/22 0529 04/03/22 0517 04/04/22 0611 04/05/22 0208  NA 135 136 135 134* 136 136  K 3.5 4.7 3.4* 3.2* 3.6 3.5  CL 99 101 100 98 103 102  CO2 27 21* '27 29 26 28  '$ GLUCOSE 81 122* 130* 129* 80 132*  BUN '11 14 16 15 9 8  '$ CREATININE 1.01* 1.04* 0.99 0.81 0.68 0.64  CALCIUM 8.1* 8.0* 8.1* 8.9 7.4* 7.5*  MG 2.3  --   --   --  1.6* 2.2  PHOS  --   --   --   --  2.0* 2.2*   GFR: Estimated Creatinine Clearance: 50.9 mL/min (by C-G formula based on SCr of 0.64 mg/dL). Liver Function Tests: Recent Labs  Lab 04/04/22 0611  04/05/22 0208  AST 19 20  ALT 11 13  ALKPHOS 44 46  BILITOT 0.4 0.6  PROT 4.5* 5.2*  ALBUMIN <1.5* 1.8*   No results for input(s): "LIPASE", "AMYLASE" in the last 168 hours.  No results for input(s): "AMMONIA" in the last 168 hours. Coagulation Profile: Recent Labs  Lab 03/30/22 0129  INR 1.2   Cardiac Enzymes: No results for input(s): "CKTOTAL", "CKMB", "CKMBINDEX", "TROPONINI" in the last 168 hours. BNP (last 3 results) No results for input(s): "PROBNP" in the last 8760 hours. HbA1C: No results for input(s): "HGBA1C" in the last 72 hours. CBG: Recent Labs  Lab 03/29/22 2355 04/04/22 1803 04/04/22 2119 04/04/22 2355 04/05/22 0615  GLUCAP 103* 98 168* 145* 132*   Lipid Profile: Recent Labs    04/05/22 0208  TRIG 78   Thyroid Function Tests: No results for input(s): "TSH", "T4TOTAL", "FREET4", "T3FREE", "THYROIDAB" in the last 72 hours. Anemia Panel: No results for input(s): "VITAMINB12", "FOLATE", "FERRITIN", "TIBC", "IRON", "RETICCTPCT" in the last 72 hours. Sepsis Labs: No results for input(s): "PROCALCITON", "LATICACIDVEN" in the last 168 hours.  No results found for this or any previous  visit (from the past 240 hour(s)).       Radiology Studies: [place] EKG SITE RITE  Result Date: 04/04/2022 If Site Rite image not attached, placement could not be confirmed due to current cardiac rhythm.       Scheduled Meds:  Chlorhexidine Gluconate Cloth  6 each Topical Daily   insulin aspart  0-9 Units Subcutaneous Q6H   levothyroxine  75 mcg Intravenous Daily   lidocaine  1 patch Transdermal Q24H   lip balm   Topical BID   Nintedanib  100 mg Oral Q12H   nitroGLYCERIN  0.5 inch Topical Q6H   sodium chloride flush  10-40 mL Intracatheter Q12H   sodium chloride flush  3 mL Intravenous Q12H   thiamine (VITAMIN B1) injection  100 mg Intravenous Daily   Continuous Infusions:  sodium chloride     acetaminophen 1,000 mg (04/05/22 1139)   dextrose 5 % and 0.45%  NaCl 60 mL/hr at 04/05/22 1140   dextrose 5 % and 0.45% NaCl     famotidine (PEPCID) IV 20 mg (04/05/22 0937)   methocarbamol (ROBAXIN) IV 500 mg (04/05/22 0526)   ondansetron (ZOFRAN) IV     potassium PHOSPHATE IVPB (in mmol)     promethazine (PHENERGAN) injection (IM or IVPB) 12.5 mg (04/03/22 0429)   TPN ADULT (ION) 40 mL/hr at 04/04/22 1808   TPN ADULT (ION)       LOS: 7 days    Time spent: 35 minutes    Barb Merino, MD [facility] Pager [phone]

## 2022-04-05 NOTE — Progress Notes (Signed)
Patient stood at bedside and serosanguineous fluid/ light blood came out of her rectum. NP Zebedee Iba.

## 2022-04-05 NOTE — Progress Notes (Signed)
PHARMACY - TOTAL PARENTERAL NUTRITION CONSULT NOTE   Indication: Prolonged ileus  Patient Measurements: Height: _0  (162.6 cm) Weight: 63 kg (138 lb 14.2 oz) IBW/kg (Calculated) : 54.7 TPN AdjBW (KG): 63 Body mass index is 23.84 kg/m. Usual Weight:   Assessment:  Pharmacy is consulted to initiate TPN in 76 yo female with prolonged ileus. Pt  is POD 4 for robotic lower anterior rectosigmoid resection with colostomy and drainage of pelvic abscess by Dr. [name] 12/26.   Glucose / Insulin:  - No history of DM  - A1c IP  - Target glucose <150. Range 98-168 upon start of TPN   Electrolytes:  - Potassium is on lower end at 3.5,  Phosphorus is low at 2.2 - Other are WNL including CorrCa at 9.3  Renal:  - SCr < 1, BUN 8  Hepatic:  - LFT, alk phos WNL  - albumin low at 1.8  - TG 78 ( 12/31)  Intake / Output; MIVF:  - UOP not charted,900 mL of NG output  - (-)919  ml I/O on 12/29  - D51/2 NS at 60 ml/hr  GI Imaging: - 12/24: Current findings are consistent with complicated sigmoid diverticulitis. Distal sigmoid colon shows wall thickening and adjacent inflammation. GI Surgeries / Procedures:  - 12/26: robotic lower anterior rectosigmoid resection with colostomy and drainage of pelvic abscess   Central access: plan to place 12/30  TPN start date:  12/30   Nutritional Goals: Goal TPN rate is 75 mL/hr (provides 90 g of protein and 1818 kcals per day)  RD Assessment: Estimated Needs Total Energy Estimated Needs: 1700-1900 kcals Total Protein Estimated Needs: 85-100 g Total Fluid Estimated Needs: >/= 1.7 L  Current Nutrition:  NPO  Plan:  Potassium phosphate 20 mmol x1    Increase  TPN to 48m/hr at 1800 Electrolytes in TPN:  Na 558m/L  Inc K 7043mL  Ca 5mE6m  Mg 5mEq60m Inc Phos  to 20 mmol/L  Cl:Ac 1:1 Add standard MVI and trace elements to TPN Continue  Sensitive q6h SSI and adjust as needed  Thiamine ordered by RD on 12/30. Dose given already on 12/31. Will  add to TPN starting 1/1 for a total of 5 days  Reduce MIVF to 60 mL/hr at 1800. Will remove KCl from MIVF  Monitor TPN labs on Mon/Thurs  NikolRoyetta AsalrmD, BCPS 04/05/2022 10:10 AM

## 2022-04-05 NOTE — Progress Notes (Signed)
BRIEF PHARMACY NOTE  Pharmacy has been consulted to extend antibiotic therapy with Zosyn for additional 5 days due to persistent infection.   Have ordered Zosyn 3.375g IV q8h (each dose infused over 4 hours) x 5 more days.  Need for further dosage adjustment appears unlikely at present, so pharmacy will sign off note writing for antibiotic at this time.  Please reconsult if a change in clinical status warrants re-evaluation of dosage.   Lindell Spar, PharmD, BCPS Clinical Pharmacist 04/05/2022 12:30 PM

## 2022-04-05 NOTE — Progress Notes (Signed)
Mobility Specialist - Progress Note   04/05/22 0921  Mobility  Activity Transferred to/from [facility];Transferred from bed to chair  Level of Assistance Standby assist, set-up cues, supervision of patient - no hands on  Assistive Device Front wheel walker  Distance Ambulated (ft) 5 ft  Activity Response Tolerated well  Mobility Referral Yes  $Mobility charge 1 Mobility   Pt received in bed and agreeable to transfer to [facility] & then recliner w/ help of nurse.. No complaints during transfer. Pt to recliner after session with all needs met.     [facility]

## 2022-04-05 NOTE — Progress Notes (Signed)
5 Days Post-Op   Subjective/Chief Complaint: Scant amount serosang fluid out of rectum when standing, didn't get up much, nothing out of bag yet   Objective: Vital signs in last 24 hours: Temp:  [97.7 F (36.5 C)-98.2 F (36.8 C)] 98.1 F (36.7 C) (12/31 0428) Pulse Rate:  [59-87] 87 (12/31 0654) Resp:  [16-17] 16 (12/31 0428) BP: (113-174)/(58-82) 174/82 (12/31 0654) SpO2:  [95 %-99 %] 97 % (12/31 0428) Last BM Date : 04/04/22 (from ostomy)  Intake/Output from previous day: 12/30 0701 - 12/31 0700 In: 2008.8 [P.O.:75; I.V.:1096.5; IV Piggyback:837.3] Out: 2928 [Urine:2000; Emesis/NG output:900; Drains:28] Intake/Output this shift: No intake/output data recorded.   General awake and alert Pulm effort normal Cv regular Abd less distended, incisions clean, jp serosang, no output in stoma pouch  Lab Results:  Recent Labs    04/04/22 0611 04/04/22 1431  WBC 6.6 7.8  HGB 7.7* 8.5*  HCT 24.1* 26.5*  PLT 265 341   BMET Recent Labs    04/04/22 0611 04/05/22 0208  NA 136 136  K 3.6 3.5  CL 103 102  CO2 26 28  GLUCOSE 80 132*  BUN 9 8  CREATININE 0.68 0.64  CALCIUM 7.4* 7.5*   PT/INR No results for input(s): "LABPROT", "INR" in the last 72 hours. ABG No results for input(s): "PHART", "HCO3" in the last 72 hours.  Invalid input(s): "PCO2", "PO2"  Studies/Results: [place] EKG SITE RITE  Result Date: 04/04/2022 If Site Rite image not attached, placement could not be confirmed due to current cardiac rhythm.  DG Abd Portable 1V  Result Date: 04/03/2022 CLINICAL DATA:  76 year old female NG tube placement. EXAM: PORTABLE ABDOMEN - 1 VIEW COMPARISON:  0831 hours today and earlier. FINDINGS: Portable AP semi upright view at 1102 hours. Enteric tube placed into the stomach, left upper quadrant. Side hole projects at the level of the gastric body. Negative visible bowel gas pattern. Levoconvex thoracolumbar scoliosis. Negative lung bases. IMPRESSION: Satisfactory enteric  tube placement into the stomach. Electronically Signed   By: Genevie Ann M.D.   On: 04/03/2022 11:10   DG Abd Portable 1V  Result Date: 04/03/2022 CLINICAL DATA:  Ileus following gastrointestinal surgery EXAM: PORTABLE ABDOMEN - 1 VIEW COMPARISON:  Portable exam 0831 hours compared to 01/26/2022 FINDINGS: Surgical drain in pelvis. Nonobstructive bowel gas pattern. No bowel dilatation or bowel wall thickening. Subcutaneous soft tissue gas within the abdominal wall bilaterally consistent with surgery. Ostomy LEFT lower quadrant. Bones demineralized with degenerative changes thoracolumbar spine. IMPRESSION: Nonobstructive bowel gas pattern. Electronically Signed   By: Lavonia Dana M.D.   On: 04/03/2022 09:06    Anti-infectives: Anti-infectives (From admission, onward)    Start     Dose/Rate Route Frequency Ordered Stop   03/31/22 1045  cefoTEtan (CEFOTAN) 2 g in sodium chloride 0.9 % 100 mL IVPB        2 g 200 mL/hr over 30 Minutes Intravenous On call to O.R. 03/31/22 0959 03/31/22 1225   03/29/22 0800  piperacillin-tazobactam (ZOSYN) IVPB 3.375 g        3.375 g 12.5 mL/hr over 240 Minutes Intravenous Every 8 hours 03/29/22 0746 04/05/22 1621       Assessment/Plan: POD# 5 s/p robotic lower anterior rectosigmoid resection with colostomy and drainage of pelvic abscess by Dr. 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  recheck was 26.5, if stable in am will restart lovenox - Continue drain. May be able to remove prior to discharge - WOC following for new ostomy - overall doing well with it. She will need a little more teaching prior to dc   FEN:  TPN ID: [email or web address] VTE: lovenox   Chronic diastolic HF Hypothyroidism Pulmonary fibrosis HTN  Alexandria [name] 04/05/2022

## 2022-04-06 ENCOUNTER — Inpatient Hospital Stay (HOSPITAL_COMMUNITY): Payer: Medicare Other

## 2022-04-06 DIAGNOSIS — K5730 Diverticulosis of large intestine without perforation or abscess without bleeding: Secondary | ICD-10-CM | POA: Diagnosis not present

## 2022-04-06 LAB — CBC
HCT: 27 % — ABNORMAL LOW (ref 36.0–46.0)
Hemoglobin: 8.5 g/dL — ABNORMAL LOW (ref 12.0–15.0)
MCH: 31.8 pg (ref 26.0–34.0)
MCHC: 31.5 g/dL (ref 30.0–36.0)
MCV: 101.1 fL — ABNORMAL HIGH (ref 80.0–100.0)
Platelets: 298 10*3/uL (ref 150–400)
RBC: 2.67 MIL/uL — ABNORMAL LOW (ref 3.87–5.11)
RDW: 15.1 % (ref 11.5–15.5)
WBC: 10.8 10*3/uL — ABNORMAL HIGH (ref 4.0–10.5)
nRBC: 0 % (ref 0.0–0.2)

## 2022-04-06 LAB — COMPREHENSIVE METABOLIC PANEL
ALT: 11 U/L (ref 0–44)
AST: 15 U/L (ref 15–41)
Albumin: 2 g/dL — ABNORMAL LOW (ref 3.5–5.0)
Alkaline Phosphatase: 47 U/L (ref 38–126)
Anion gap: 5 (ref 5–15)
BUN: 6 mg/dL — ABNORMAL LOW (ref 8–23)
CO2: 27 mmol/L (ref 22–32)
Calcium: 7.5 mg/dL — ABNORMAL LOW (ref 8.9–10.3)
Chloride: 102 mmol/L (ref 98–111)
Creatinine, Ser: 0.6 mg/dL (ref 0.44–1.00)
GFR, Estimated: >60 mL/min (ref >60)
Glucose, Bld: 149 mg/dL — ABNORMAL HIGH (ref 70–99)
Potassium: 3.5 mmol/L (ref 3.5–5.1)
Sodium: 134 mmol/L — ABNORMAL LOW (ref 135–145)
Total Bilirubin: 0.4 mg/dL (ref 0.3–1.2)
Total Protein: 5.6 g/dL — ABNORMAL LOW (ref 6.5–8.1)

## 2022-04-06 LAB — PREALBUMIN: Prealbumin: 7 mg/dL — ABNORMAL LOW (ref 18–38)

## 2022-04-06 LAB — GLUCOSE, CAPILLARY
Glucose-Capillary: 125 mg/dL — ABNORMAL HIGH (ref 70–99)
Glucose-Capillary: 126 mg/dL — ABNORMAL HIGH (ref 70–99)
Glucose-Capillary: 131 mg/dL — ABNORMAL HIGH (ref 70–99)
Glucose-Capillary: 131 mg/dL — ABNORMAL HIGH (ref 70–99)
Glucose-Capillary: 156 mg/dL — ABNORMAL HIGH (ref 70–99)

## 2022-04-06 LAB — PHOSPHORUS: Phosphorus: 2.7 mg/dL (ref 2.5–4.6)

## 2022-04-06 LAB — MAGNESIUM: Magnesium: 2.4 mg/dL (ref 1.7–2.4)

## 2022-04-06 MED ORDER — DEXTROSE-NACL 5-0.45 % IV SOLN: INTRAVENOUS | Status: DC

## 2022-04-06 MED ORDER — TRAVASOL 10 % IV SOLN
INTRAVENOUS | Status: AC
  Filled 2022-04-06: qty 900

## 2022-04-06 MED ORDER — ENOXAPARIN SODIUM 40 MG/0.4ML IJ SOSY
40.0000 mg | PREFILLED_SYRINGE | INTRAMUSCULAR | Status: DC
  Administered 2022-04-06 – 2022-04-10 (×4): 40 mg via SUBCUTANEOUS
  Filled 2022-04-06 (×4): qty 0.4

## 2022-04-06 MED ORDER — IOHEXOL 300 MG/ML  SOLN
100.0000 mL | Freq: Once | INTRAMUSCULAR | Status: AC | PRN
  Administered 2022-04-06: 100 mL via INTRAVENOUS

## 2022-04-06 MED ORDER — POTASSIUM CHLORIDE 10 MEQ/100ML IV SOLN
10.0000 meq | INTRAVENOUS | Status: AC
  Administered 2022-04-06 (×3): 10 meq via INTRAVENOUS
  Filled 2022-04-06: qty 100

## 2022-04-06 MED ORDER — PHENOL 1.4 % MT LIQD
1.0000 | OROMUCOSAL | Status: DC | PRN
  Administered 2022-04-06: 1 via OROMUCOSAL
  Filled 2022-04-06: qty 177

## 2022-04-06 NOTE — Progress Notes (Signed)
Mobility Specialist - Progress Note   04/06/22 0935  Mobility  Activity Transferred from bed to chair  Level of Assistance Standby assist, set-up cues, supervision of patient - no hands on  Assistive Device Front wheel walker  Distance Ambulated (ft) 5 ft  Activity Response Tolerated well  Mobility Referral Yes  $Mobility charge 1 Mobility   Pt received in bed and agreeable to mobility. Once standing pt began bleeding. Pt opted out to just transfer to recliner until bleeding subsides some. Assisted nurse w/ cleaning. Pt c/o 8/10 rectum pain. Nurse aware. No other complaints during transfer. Pt to recliner after session with all needs met, chair alarm on, call bell in reach & nurse in room.    [facility]

## 2022-04-06 NOTE — Care Management Important Message (Signed)
Important Message  Patient Details IM Letter given. Name: ADDYLIN MANKE MRN: [id number] Date of Birth: 1946/02/23   Medicare Important Message Given:  Yes     Kerin Salen 04/06/2022, 1:16 PM

## 2022-04-06 NOTE — Progress Notes (Signed)
Patient pulled NGT out around 4am. Attempted to replace x2 but patient pushed my arm away while attempting to insert NGT and second time she refused after getting IV Dilaudid to help with pain. No nausea or vomiting at the moment. NP Olena Heckle was notified.

## 2022-04-06 NOTE — Progress Notes (Signed)
PROGRESS NOTE    Alexandria [name]  JOA:[id number] DOB: July 26, 1945 DOA: 03/28/2022 PCP: [name], DO    Brief Narrative:  Alexandria [name] is a 77 y/o with a history of hypothyroidism, grade I DD, HTN, IPF, diverticulosis, diverticulitis, DVT, chronic hypokalemia who has a long history of constipation and has recently tried Linzess but stopped after 2 weeks due to lack of efficacy by her report. She recently got CT abd/pelvis 03/20/22 which revealed an enhancing lesion/mass just below the sigmoid-rectal junction. Flex Sig was performed 03/20/22 which revealed a polypoid mass at the sigmoid-rectal junction. Bx was readout by pathology as an inflammatory poly w/o dysplasia or malignancy. Patient had MRI abd/pelvis 03/26/22 -showed severe sigmoid diverticulitis.  Due to continued constipation and diffuse abdominal pain, worst in the LUQ, she presented to [facility] for evaluation. General surgery was consulted. Patient underwent repeat CT abd/pelvis and thought to have complicated diverticulitis with abscess. Unable to drain per IR.  Ultimately, she underwent rectosigmoid resection with colostomy by Dr. [name] 12/26.  With poor recovery postop.     Assessment & Plan:   Complicated sigmoid diverticulitis with pelvic abscess: Rectosigmoid resection and colostomy Dr. [name] 12/26.  IV fluids.  On IV Zosyn.  Biopsy with evidence of diverticulitis with pericolonic abscess and perforation.  No malignancy. 12/29, nausea and vomiting.  NG tube inserted.  NPO.  Mobilize.  Adequate pain medications.  Replace electrolytes.  Surgery following. Prolonged n.p.o. currently on TPN. TPN to be managed by pharmacy.  Electrolyte replacement to be managed by pharmacy with TPN. 1/1, NG tube fell off.  Patient without active vomiting today.  Monitor.  Symptomatic anemia due to postop blood loss: Previous hemoglobin 12-13.  Hemoglobin dropped to 7.3-1 unit of PRBC-9.8-7.7-8.5 Recheck today.  Today no  indication for further transfusion.  Hospital-acquired delirium: Patient is developing impulsiveness and occasional confusion.  Likely related to hospitalization, medications and severe medical illness. Frequent reorientation.  Chronic medical issues including Chronic diastolic congestive heart failure, diuresis on hold.  On maintenance IV fluids.  Euvolemic. Hypothyroidism, unable to take Synthroid.  On IV Synthroid. Idiopathic pulmonary fibrosis: Without exacerbation. Hypokalemia: Replaced and adequate. Hypomagnesemia: Replaced. Hypophosphatemia: Replaced  Urinary retention: Secondary to pelvic pain and immobility.  Continue Foley catheter until improvement of mobility.  Rectal bleeding and secretions: Anticipated from remaining rectal stump and inflammation.  Monitor.   DVT prophylaxis: enoxaparin (LOVENOX) injection 40 mg Start: 04/06/22 1000 Place and maintain sequential compression device Start: 03/30/22 0940   Code Status: Full code Family Communication: Son on the phone 12/31. Disposition Plan: Status is: Inpatient Remains inpatient appropriate because: Significant abdominal pain and distention, recovery from surgery.     Consultants:  General surgery  Procedures:  Rectosigmoid resection and colostomy  Antimicrobials:  IV Zosyn 12/23---   Subjective:  Seen and examined.  More quiet and composed today.  NG tube pulled off today morning and nurses were trying to secure it back in.  Patient without any nausea or vomiting.  Will hold off. Abdomen soreness is present.  Still has thin serosanguineous drainage from the rectum.  No stool in the colostomy.  Objective: Vitals:   04/05/22 0654 04/05/22 1218 04/05/22 2033 04/06/22 0550  BP: (!) 174/82 (!) 155/70 137/77 (!) 158/77  Pulse: 87 76 74 86  Resp:  '17 16 16  '$ Temp:  98.7 F (37.1 C) (!) 97.5 F (36.4 C) 98.5 F (36.9 C)  TempSrc:  Oral Oral Oral  SpO2:  97% 98% 97%  Weight:    68.9 kg  Height:         Intake/Output Summary (Last 24 hours) at 04/06/2022 1056 Last data filed at 04/06/2022 0600 Gross per 24 hour  Intake 2924.11 ml  Output 2210 ml  Net 714.11 ml   Filed Weights   03/28/22 1738 03/31/22 1057 04/06/22 0550  Weight: 63 kg 63 kg 68.9 kg    Examination:  General exam: Appears comfortable while quiet and calm today. Respiratory system: Clear to auscultation. Respiratory effort normal.  No added sounds. Cardiovascular system: S1 & S2 heard, RRR.  Gastrointestinal system: Abdomen is diffusely tender.  Colostomy with some old feculent material. Bowel sounds sluggish.  JP drain with serosanguineous drainage.  Incisions intact. Central nervous system: Alert and oriented x 3 to her.  More composed and interactive today. Extremities: Symmetric 5 x 5 power.    Data Reviewed: I have personally reviewed following labs and imaging studies  CBC: Recent Labs  Lab 04/03/22 0517 04/04/22 0611 04/04/22 1431 04/05/22 1150 04/06/22 0207  WBC 11.6* 6.6 7.8 12.0* 10.8*  NEUTROABS  --  3.9  --   --   --   HGB 9.8* 7.7* 8.5* 9.0* 8.5*  HCT 29.8* 24.1* 26.5* 28.3* 27.0*  MCV 96.8 100.4* 99.6 100.0 101.1*  PLT 300 265 341 309 867   Basic Metabolic Panel: Recent Labs  Lab 03/31/22 0421 04/01/22 0605 04/02/22 0529 04/03/22 0517 04/04/22 0611 04/05/22 0208 04/06/22 0207  NA 135   < > 135 134* 136 136 134*  K 3.5   < > 3.4* 3.2* 3.6 3.5 3.5  CL 99   < > 100 98 103 102 102  CO2 27   < > '27 29 26 28 27  '$ GLUCOSE 81   < > 130* 129* 80 132* 149*  BUN 11   < > '16 15 9 8 '$ 6*  CREATININE 1.01*   < > 0.99 0.81 0.68 0.64 0.60  CALCIUM 8.1*   < > 8.1* 8.9 7.4* 7.5* 7.5*  MG 2.3  --   --   --  1.6* 2.2 2.4  PHOS  --   --   --   --  2.0* 2.2* 2.7   < > = values in this interval not displayed.   GFR: Estimated Creatinine Clearance: 56.2 mL/min (by C-G formula based on SCr of 0.6 mg/dL). Liver Function Tests: Recent Labs  Lab 04/04/22 0611 04/05/22 0208 04/06/22 0207  AST '19 20  15  '$ ALT '11 13 11  '$ ALKPHOS 44 46 47  BILITOT 0.4 0.6 0.4  PROT 4.5* 5.2* 5.6*  ALBUMIN <1.5* 1.8* 2.0*   No results for input(s): "LIPASE", "AMYLASE" in the last 168 hours.  No results for input(s): "AMMONIA" in the last 168 hours. Coagulation Profile: No results for input(s): "INR", "PROTIME" in the last 168 hours.  Cardiac Enzymes: No results for input(s): "CKTOTAL", "CKMB", "CKMBINDEX", "TROPONINI" in the last 168 hours. BNP (last 3 results) No results for input(s): "PROBNP" in the last 8760 hours. HbA1C: No results for input(s): "HGBA1C" in the last 72 hours. CBG: Recent Labs  Lab 04/05/22 0615 04/05/22 1201 04/05/22 1757 04/05/22 2358 04/06/22 0551  GLUCAP 132* 147* 165* 131* 156*   Lipid Profile: Recent Labs    04/05/22 0208  TRIG 78   Thyroid Function Tests: No results for input(s): "TSH", "T4TOTAL", "FREET4", "T3FREE", "THYROIDAB" in the last 72 hours. Anemia Panel: No results for input(s): "VITAMINB12", "FOLATE", "FERRITIN", "TIBC", "IRON", "RETICCTPCT" in the last 72 hours.  Sepsis Labs: No results for input(s): "PROCALCITON", "LATICACIDVEN" in the last 168 hours.  No results found for this or any previous visit (from the past 240 hour(s)).       Radiology Studies: DG Abd Portable 1V  Result Date: 04/05/2022 CLINICAL DATA:  Ileus following gastrointestinal surgery. EXAM: PORTABLE ABDOMEN - 1 VIEW COMPARISON:  One-view abdomen 04/03/2022 FINDINGS: Gas-filled loops of small bowel are present in the mid left abdomen without significant dilation. Gas is present within the colon. Side port of the NG tube is in the proximal stomach. Mild leftward curvature of the thoracolumbar spine is stable. IMPRESSION: Gas-filled loops of small bowel in the mid left abdomen without significant dilation. Findings are compatible with ileus. Electronically Signed   By: San Morelle M.D.   On: 04/05/2022 12:44        Scheduled Meds:  Chlorhexidine Gluconate Cloth  6  each Topical Daily   enoxaparin (LOVENOX) injection  40 mg Subcutaneous Q24H   insulin aspart  0-9 Units Subcutaneous Q6H   [START ON 04/09/2022] levothyroxine  84 mcg Intravenous Daily   lidocaine  1 patch Transdermal Q24H   lip balm   Topical BID   Nintedanib  100 mg Oral Q12H   nitroGLYCERIN  0.5 inch Topical Q6H   sodium chloride flush  10-40 mL Intracatheter Q12H   sodium chloride flush  3 mL Intravenous Q12H   Continuous Infusions:  sodium chloride     dextrose 5 % and 0.45% NaCl 10 mL/hr at 04/06/22 0937   dextrose 5 % and 0.45% NaCl     famotidine (PEPCID) IV 20 mg (04/06/22 0935)   methocarbamol (ROBAXIN) IV 500 mg (04/06/22 0518)   ondansetron (ZOFRAN) IV     piperacillin-tazobactam (ZOSYN)  IV 3.375 g (04/06/22 0821)   potassium chloride 10 mEq (04/06/22 1021)   promethazine (PHENERGAN) injection (IM or IVPB) 12.5 mg (04/03/22 0429)   TPN ADULT (ION) 60 mL/hr at 04/05/22 1741   TPN ADULT (ION)       LOS: 8 days    Time spent: 35 minutes    Barb Merino, MD [facility] Pager [phone]

## 2022-04-06 NOTE — Progress Notes (Signed)
6 Days Post-Op   Subjective/Chief Complaint: Pulled ng out, nothing in stoma bag, having thin drainage (I have seen it) from rectum   Objective: Vital signs in last 24 hours: Temp:  [97.5 F (36.4 C)-98.7 F (37.1 C)] 98.5 F (36.9 C) (01/01 0550) Pulse Rate:  [74-86] 86 (01/01 0550) Resp:  [16-17] 16 (01/01 0550) BP: (137-158)/(70-77) 158/77 (01/01 0550) SpO2:  [97 %-98 %] 97 % (01/01 0550) Weight:  [68.9 kg] 68.9 kg (01/01 0550) Last BM Date : 04/05/22  Intake/Output from previous day: 12/31 0701 - 01/01 0700 In: 3900.7 [P.O.:90; I.V.:2593.4; IV Piggyback:1217.4] Out: 2920 [Urine:2350; Emesis/NG output:550; Drains:20] Intake/Output this shift: No intake/output data recorded.  General awake and alert Pulm effort normal Cv regular Abd less distended, incisions clean, jp serosang, no output in stoma pouch  Lab Results:  Recent Labs    04/05/22 1150 04/06/22 0207  WBC 12.0* 10.8*  HGB 9.0* 8.5*  HCT 28.3* 27.0*  PLT 309 298   BMET Recent Labs    04/05/22 0208 04/06/22 0207  NA 136 134*  K 3.5 3.5  CL 102 102  CO2 28 27  GLUCOSE 132* 149*  BUN 8 6*  CREATININE 0.64 0.60  CALCIUM 7.5* 7.5*   PT/INR No results for input(s): "LABPROT", "INR" in the last 72 hours. ABG No results for input(s): "PHART", "HCO3" in the last 72 hours.  Invalid input(s): "PCO2", "PO2"  Studies/Results: DG Abd Portable 1V  Result Date: 04/05/2022 CLINICAL DATA:  Ileus following gastrointestinal surgery. EXAM: PORTABLE ABDOMEN - 1 VIEW COMPARISON:  One-view abdomen 04/03/2022 FINDINGS: Gas-filled loops of small bowel are present in the mid left abdomen without significant dilation. Gas is present within the colon. Side port of the NG tube is in the proximal stomach. Mild leftward curvature of the thoracolumbar spine is stable. IMPRESSION: Gas-filled loops of small bowel in the mid left abdomen without significant dilation. Findings are compatible with ileus. Electronically Signed    By: San Morelle M.D.   On: 04/05/2022 12:44    Anti-infectives: Anti-infectives (From admission, onward)    Start     Dose/Rate Route Frequency Ordered Stop   04/05/22 1600  piperacillin-tazobactam (ZOSYN) IVPB 3.375 g        3.375 g 12.5 mL/hr over 240 Minutes Intravenous Every 8 hours 04/05/22 1222 04/10/22 1559   03/31/22 1045  cefoTEtan (CEFOTAN) 2 g in sodium chloride 0.9 % 100 mL IVPB        2 g 200 mL/hr over 30 Minutes Intravenous On call to O.R. 03/31/22 0959 03/31/22 1225   03/29/22 0800  piperacillin-tazobactam (ZOSYN) IVPB 3.375 g  Status:  Discontinued        3.375 g 12.5 mL/hr over 240 Minutes Intravenous Every 8 hours 03/29/22 0746 04/05/22 0826       Assessment/Plan: POD# 6 s/p robotic lower anterior rectosigmoid resection with colostomy and drainage of pelvic abscess by Dr. 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 May be able to remove prior to discharge - WOC following for new ostomy - overall doing well with it. She will need a little more teaching prior to dc   FEN: TPN ID: [email or web address] VTE: lovenox restart today  Chronic diastolic HF Hypothyroidism Pulmonary fibrosis HTN Rolm Bookbinder 04/06/2022

## 2022-04-06 NOTE — Progress Notes (Signed)
PHARMACY - TOTAL PARENTERAL NUTRITION CONSULT NOTE   Indication: Prolonged ileus  Patient Measurements: Height: _0  (162.6 cm) Weight: 68.9 kg (151 lb 14.4 oz) IBW/kg (Calculated) : 54.7 TPN AdjBW (KG): 63 Body mass index is 26.07 kg/m. Usual Weight:   Assessment:  Pharmacy is consulted to initiate TPN in 77 yo female with prolonged ileus. Pt  is POD 4 for robotic lower anterior rectosigmoid resection with colostomy and drainage of pelvic abscess by Dr. [name] 12/26.   Glucose / Insulin:  - No history of DM  - A1c IP  - Target glucose <150. Range 132-165 upon start of TPN   - 6 units of insulin given in past 24 hours  Electrolytes:  - Potassium is on lower end at 3.5, Na slightly low at 134  - Other are WNL including CorrCa at 9.1 Renal:  - SCr < 1, BUN 6  Hepatic:  - LFT, alk phos WNL  - albumin low at 2.0 - TG 78 ( 12/31)  Intake / Output; MIVF:  - UOP not charted,550 mL of NG output  - (+)981  ml I/O charted on 12/31 - D51/2 NS at 40 ml/hr  GI Imaging: - 12/24: Current findings are consistent with complicated sigmoid diverticulitis. Distal sigmoid colon shows wall thickening and adjacent inflammation. - 1/1 CT ordered for today  GI Surgeries / Procedures:  - 12/26: robotic lower anterior rectosigmoid resection with colostomy and drainage of pelvic abscess   Central access:   12/30  TPN start date:  12/30   Nutritional Goals: Goal TPN rate is 75 mL/hr (provides 90 g of protein and 1818 kcals per day)  RD Assessment: Estimated Needs Total Energy Estimated Needs: 1700-1900 kcals Total Protein Estimated Needs: 85-100 g Total Fluid Estimated Needs: >/= 1.7 L  Current Nutrition:  NPO  Plan:  Potassium chloride 10 meq IV x 3   Increase TPN to 75 mL/hr at 1800, the goal rate  Electrolytes in TPN:  Inc  Na to 40mq/L  Inc K 831m/L  Ca 10m38mL  Dec Mg to 4 mEq/L  Phos 20 mmol/L  Cl:Ac 1:1 Add standard MVI and trace elements to TPN Continue  Sensitive q6h  SSI and adjust as needed  Thiamine ordered by RD on 12/30. Dose given already on 12/31. Will add to TPN starting 1/1 for a total of 5 days through 1/3  Reduce MIVF to KVO mL/hr at 1800.   BMP, magnesium, phosphorus with AM labs  Monitor TPN labs on Mon/Thurs  NikRoyetta AsalharmD, BCPS 04/06/2022 9:09 AM

## 2022-04-07 ENCOUNTER — Inpatient Hospital Stay: Payer: Self-pay

## 2022-04-07 DIAGNOSIS — K5730 Diverticulosis of large intestine without perforation or abscess without bleeding: Secondary | ICD-10-CM | POA: Diagnosis not present

## 2022-04-07 LAB — BASIC METABOLIC PANEL
Anion gap: 7 (ref 5–15)
BUN: 8 mg/dL (ref 8–23)
CO2: 26 mmol/L (ref 22–32)
Calcium: 7.9 mg/dL — ABNORMAL LOW (ref 8.9–10.3)
Chloride: 100 mmol/L (ref 98–111)
Creatinine, Ser: 0.52 mg/dL (ref 0.44–1.00)
GFR, Estimated: >60 mL/min (ref >60)
Glucose, Bld: 91 mg/dL (ref 70–99)
Potassium: 4.2 mmol/L (ref 3.5–5.1)
Sodium: 133 mmol/L — ABNORMAL LOW (ref 135–145)

## 2022-04-07 LAB — CBC
HCT: 30.5 % — ABNORMAL LOW (ref 36.0–46.0)
Hemoglobin: 9.3 g/dL — ABNORMAL LOW (ref 12.0–15.0)
MCH: 31.6 pg (ref 26.0–34.0)
MCHC: 30.5 g/dL (ref 30.0–36.0)
MCV: 103.7 fL — ABNORMAL HIGH (ref 80.0–100.0)
Platelets: 339 10*3/uL (ref 150–400)
RBC: 2.94 MIL/uL — ABNORMAL LOW (ref 3.87–5.11)
RDW: 15.3 % (ref 11.5–15.5)
WBC: 11.2 10*3/uL — ABNORMAL HIGH (ref 4.0–10.5)
nRBC: 0 % (ref 0.0–0.2)

## 2022-04-07 LAB — GLUCOSE, CAPILLARY
Glucose-Capillary: 111 mg/dL — ABNORMAL HIGH (ref 70–99)
Glucose-Capillary: 140 mg/dL — ABNORMAL HIGH (ref 70–99)
Glucose-Capillary: 149 mg/dL — ABNORMAL HIGH (ref 70–99)
Glucose-Capillary: 91 mg/dL (ref 70–99)

## 2022-04-07 LAB — PHOSPHORUS: Phosphorus: 2.7 mg/dL (ref 2.5–4.6)

## 2022-04-07 LAB — MAGNESIUM: Magnesium: 2.3 mg/dL (ref 1.7–2.4)

## 2022-04-07 LAB — HEMOGLOBIN A1C
Hgb A1c MFr Bld: 5.4 % (ref 4.8–5.6)
Mean Plasma Glucose: 108 mg/dL

## 2022-04-07 MED ORDER — DEXTROSE 10 % IV SOLN: INTRAVENOUS | Status: AC

## 2022-04-07 MED ORDER — ACETAMINOPHEN 325 MG PO TABS
650.0000 mg | ORAL_TABLET | Freq: Four times a day (QID) | ORAL | Status: DC | PRN
  Administered 2022-04-08: 650 mg via ORAL
  Filled 2022-04-07: qty 2

## 2022-04-07 MED ORDER — BOOST / RESOURCE BREEZE PO LIQD CUSTOM
1.0000 | Freq: Three times a day (TID) | ORAL | Status: DC
  Administered 2022-04-07: 1 via ORAL

## 2022-04-07 MED ORDER — TRAMADOL HCL 50 MG PO TABS
50.0000 mg | ORAL_TABLET | Freq: Four times a day (QID) | ORAL | Status: DC | PRN
  Administered 2022-04-07 – 2022-04-08 (×3): 50 mg via ORAL
  Filled 2022-04-07 (×3): qty 1

## 2022-04-07 MED ORDER — TRAVASOL 10 % IV SOLN
INTRAVENOUS | Status: DC
  Filled 2022-04-07: qty 900

## 2022-04-07 NOTE — Progress Notes (Signed)
Mobility Specialist - Progress Note   04/07/22 1252  Mobility  Activity Transferred from bed to chair;Transferred from chair to bed  Level of Assistance Standby assist, set-up cues, supervision of patient - no hands on  Assistive Device Front wheel walker  Distance Ambulated (ft) 5 ft  Activity Response Tolerated well  Mobility Referral Yes  $Mobility charge 1 Mobility   Pt received in bed and agreeable to transfer to recliner. Once transferred, pt stated "I can't do this, I need to go back to bed". Assisted pt w/ transferring back to bed. Pt agreed to try & ambulate this afternoon. Pt to bed after session with all needs met.     [facility]

## 2022-04-07 NOTE — Progress Notes (Signed)
Spoke with Dr, Sloan Leiter regarding PICC placement for TPN. Pt has been ordered clear liquids, and TPA has been put on hold at this time. Received MD order to cancel current PICC order.

## 2022-04-07 NOTE — Progress Notes (Signed)
PROGRESS NOTE    Alexandria [name]  OFB:[id number] DOB: 11-20-1945 DOA: 03/28/2022 PCP: [name], DO    Brief Narrative:  Alexandria [name] is a 77 y/o with a history of hypothyroidism, grade I DD, HTN, IPF, diverticulosis, diverticulitis, DVT, chronic hypokalemia who has a long history of constipation and has recently tried Linzess but stopped after 2 weeks due to lack of efficacy by her report. She recently got CT abd/pelvis 03/20/22 which revealed an enhancing lesion/mass just below the sigmoid-rectal junction. Flex Sig was performed 03/20/22 which revealed a polypoid mass at the sigmoid-rectal junction. Bx was readout by pathology as an inflammatory poly w/o dysplasia or malignancy. Patient had MRI abd/pelvis 03/26/22 -showed severe sigmoid diverticulitis.  Due to continued constipation and diffuse abdominal pain, worst in the LUQ, she presented to [facility] for evaluation. General surgery was consulted. Patient underwent repeat CT abd/pelvis and thought to have complicated diverticulitis with abscess. Unable to drain per IR.  Ultimately, she underwent rectosigmoid resection with colostomy by Dr. [name] 12/26.  With poor recovery postop.     Assessment & Plan:   Complicated sigmoid diverticulitis with pelvic abscess: Persistent ileus. Rectosigmoid resection and colostomy Dr. [name] 12/26.  IV fluids.  On IV Zosyn.  Biopsy with evidence of diverticulitis with pericolonic abscess and perforation.  No malignancy. 12/29, nausea and vomiting.  NG tube inserted.  NPO.  Mobilize.  Adequate pain medications.  Replace electrolytes.  Surgery following. 1/2, NG tube pulled out.  PICC line pulled out.  Has some bowel function. Challenging with clears.  Mobilize. Hold TPN, continue dextrose infusion. Continues to have rectal bleeding, intermittent likely from the stump.  Symptomatic anemia due to postop blood loss: Previous hemoglobin 12-13.  Hemoglobin dropped to 7.3-1 unit of  PRBC-9.8-7.7-8.5.  Will recheck tomorrow.  Hospital-acquired delirium: Patient is developing impulsiveness and occasional confusion.  Likely related to hospitalization, medications and severe medical illness. Frequent reorientation.  Chronic medical issues including Chronic diastolic congestive heart failure, diuresis on hold.  On maintenance IV fluids.  Euvolemic. Hypothyroidism, switch back to oral Synthroid today. Idiopathic pulmonary fibrosis: Without exacerbation. Hypokalemia: Replaced and adequate. Hypomagnesemia: Replaced. Hypophosphatemia: Replaced  Urinary retention: Secondary to pelvic pain and immobility.  Continue Foley catheter until improvement of mobility.  Rectal bleeding and secretions: Anticipated from remaining rectal stump and inflammation.  Monitor.   DVT prophylaxis: enoxaparin (LOVENOX) injection 40 mg Start: 04/06/22 1000 Place and maintain sequential compression device Start: 03/30/22 0940   Code Status: Full code Family Communication: Son on the phone 12/31. Disposition Plan: Status is: Inpatient Remains inpatient appropriate because: Significant abdominal pain and distention, recovery from surgery.     Consultants:  General surgery  Procedures:  Rectosigmoid resection and colostomy  Antimicrobials:  IV Zosyn 12/23---   Subjective:  Patient seen and examined.   Denies any nausea or vomiting.  She got confused and accidentally pulled on her PICC line.  Patient tells me that she has noted some stool on her colostomy pouch.  Pain is controlled.  Still having thin serous and bloody secretions through the rectum.  Objective: Vitals:   04/06/22 0550 04/06/22 1314 04/06/22 2213 04/07/22 0546  BP: (!) 158/77 (!) 140/76 (!) 148/75 128/60  Pulse: 86 77 82 81  Resp: '16 18 18 18  '$ Temp: 98.5 F (36.9 C) 98.3 F (36.8 C) 98.5 F (36.9 C) 98.5 F (36.9 C)  TempSrc: Oral Oral Oral Oral  SpO2: 97% 99% 96% 97%  Weight: 68.9 kg  Height:         Intake/Output Summary (Last 24 hours) at 04/07/2022 1026 Last data filed at 04/07/2022 0600 Gross per 24 hour  Intake 1748.4 ml  Output 2957 ml  Net -1208.6 ml    Filed Weights   03/28/22 1738 03/31/22 1057 04/06/22 0550  Weight: 63 kg 63 kg 68.9 kg    Examination:  General exam: Appears comfortable.  Slightly anxious.  On room air. Respiratory system: Clear to auscultation. Respiratory effort normal.  No added sounds. Cardiovascular system: S1 & S2 heard, RRR.  Gastrointestinal system: Abdomen is mildly tender.   Colostomy with about 50 mL of loose stool.  Bowel sounds present.  JP drain with serosanguineous drainage.  Incisions intact. Central nervous system: Alert and oriented x 3-4. More composed and interactive today. Extremities: Symmetric 5 x 5 power.    Data Reviewed: I have personally reviewed following labs and imaging studies  CBC: Recent Labs  Lab 04/03/22 0517 04/04/22 0611 04/04/22 1431 04/05/22 1150 04/06/22 0207  WBC 11.6* 6.6 7.8 12.0* 10.8*  NEUTROABS  --  3.9  --   --   --   HGB 9.8* 7.7* 8.5* 9.0* 8.5*  HCT 29.8* 24.1* 26.5* 28.3* 27.0*  MCV 96.8 100.4* 99.6 100.0 101.1*  PLT 300 265 341 309 017    Basic Metabolic Panel: Recent Labs  Lab 04/03/22 0517 04/04/22 0611 04/05/22 0208 04/06/22 0207 04/07/22 0325  NA 134* 136 136 134* 133*  K 3.2* 3.6 3.5 3.5 4.2  CL 98 103 102 102 100  CO2 '29 26 28 27 26  '$ GLUCOSE 129* 80 132* 149* 91  BUN '15 9 8 '$ 6* 8  CREATININE 0.81 0.68 0.64 0.60 0.52  CALCIUM 8.9 7.4* 7.5* 7.5* 7.9*  MG  --  1.6* 2.2 2.4 2.3  PHOS  --  2.0* 2.2* 2.7 2.7    GFR: Estimated Creatinine Clearance: 56.2 mL/min (by C-G formula based on SCr of 0.52 mg/dL). Liver Function Tests: Recent Labs  Lab 04/04/22 0611 04/05/22 0208 04/06/22 0207  AST '19 20 15  '$ ALT '11 13 11  '$ ALKPHOS 44 46 47  BILITOT 0.4 0.6 0.4  PROT 4.5* 5.2* 5.6*  ALBUMIN <1.5* 1.8* 2.0*    No results for input(s): "LIPASE", "AMYLASE" in the last 168  hours.  No results for input(s): "AMMONIA" in the last 168 hours. Coagulation Profile: No results for input(s): "INR", "PROTIME" in the last 168 hours.  Cardiac Enzymes: No results for input(s): "CKTOTAL", "CKMB", "CKMBINDEX", "TROPONINI" in the last 168 hours. BNP (last 3 results) No results for input(s): "PROBNP" in the last 8760 hours. HbA1C: Recent Labs    04/05/22 0208  HGBA1C 5.4   CBG: Recent Labs  Lab 04/06/22 0551 04/06/22 1151 04/06/22 1743 04/06/22 2346 04/07/22 0523  GLUCAP 156* 126* 131* 125* 111*    Lipid Profile: Recent Labs    04/05/22 0208  TRIG 78    Thyroid Function Tests: No results for input(s): "TSH", "T4TOTAL", "FREET4", "T3FREE", "THYROIDAB" in the last 72 hours. Anemia Panel: No results for input(s): "VITAMINB12", "FOLATE", "FERRITIN", "TIBC", "IRON", "RETICCTPCT" in the last 72 hours. Sepsis Labs: No results for input(s): "PROCALCITON", "LATICACIDVEN" in the last 168 hours.  No results found for this or any previous visit (from the past 240 hour(s)).       Radiology Studies: [place] EKG SITE RITE  Result Date: 04/07/2022 If Site Rite image not attached, placement could not be confirmed due to current cardiac rhythm.  CT ABDOMEN PELVIS W  CONTRAST  Result Date: 04/06/2022 CLINICAL DATA:  Postop pain, vomiting EXAM: CT ABDOMEN AND PELVIS WITH CONTRAST TECHNIQUE: Multidetector CT imaging of the abdomen and pelvis was performed using the standard protocol following bolus administration of intravenous contrast. RADIATION DOSE REDUCTION: This exam was performed according to the departmental dose-optimization program which includes automated exposure control, adjustment of the mA and/or kV according to patient size and/or use of iterative reconstruction technique. CONTRAST:  100 mL OMNIPAQUE IOHEXOL 300 MG/ML  SOLN COMPARISON:  03/29/2022 FINDINGS: Lower chest: Peripheral interstitial changes consistent with fibrosis. Minimal left-sided pleural  effusion. Atheromatous calcifications of the aorta and coronary arteries. No pneumothorax or pericardial effusion identified. Hepatobiliary: Distended gallbladder. No hepatic parenchymal abnormalities. There is suggestion of mild intrahepatic biliary ductal dilatation. Pancreas: Atrophic appearance without evidence of focal lesions or Peripancreatic inflammatory changes. Spleen: Normal in size without focal abnormality. Adrenals/Urinary Tract: Adrenal glands are unremarkable. Kidneys are normal, without renal calculi, focal lesion, or hydronephrosis. Bladder is empty with a Foley catheter. Stomach/Bowel: Postop changes left-sided partial colectomy. Diverting ostomy left lower quadrant. Mild small bowel dilatation left mid to lower abdomen consistent with ileus. Vascular/Lymphatic: Aortic atherosclerosis. No enlarged abdominal or pelvic lymph nodes. Reproductive: Status post hysterectomy. No adnexal masses. Other: There is a drain identified with the tip in the posterior pelvis. Fluid collections with air consistent with abscesses that were present previously have diminished. There continues to be evidence of extraluminal air and fluid in the pelvis presacral region. Findings consist of the original abscess measuring up to 5 cm maximum dimension without a well defined wall. Musculoskeletal: No acute or significant osseous findings. IMPRESSION: 1. Pelvic drain in place. Decreased but residual pelvic abscess. Pelvic collection measuring up to 5 cm without well-defined wall. 2. Changes consistent with ileus. 3. Status post sigmoid resection with diverting ostomy. 4. Distended gallbladder and mild intrahepatic ductal dilatation. 5. Small left-sided pleural effusion with evidence of pulmonary interstitial fibrosis. Electronically Signed   By: Sammie Bench M.D.   On: 04/06/2022 12:09   DG Abd Portable 1V  Result Date: 04/05/2022 CLINICAL DATA:  Ileus following gastrointestinal surgery. EXAM: PORTABLE ABDOMEN - 1  VIEW COMPARISON:  One-view abdomen 04/03/2022 FINDINGS: Gas-filled loops of small bowel are present in the mid left abdomen without significant dilation. Gas is present within the colon. Side port of the NG tube is in the proximal stomach. Mild leftward curvature of the thoracolumbar spine is stable. IMPRESSION: Gas-filled loops of small bowel in the mid left abdomen without significant dilation. Findings are compatible with ileus. Electronically Signed   By: San Morelle M.D.   On: 04/05/2022 12:44        Scheduled Meds:  Chlorhexidine Gluconate Cloth  6 each Topical Daily   enoxaparin (LOVENOX) injection  40 mg Subcutaneous Q24H   insulin aspart  0-9 Units Subcutaneous Q6H   [START ON 04/09/2022] levothyroxine  84 mcg Intravenous Daily   lidocaine  1 patch Transdermal Q24H   lip balm   Topical BID   Nintedanib  100 mg Oral Q12H   nitroGLYCERIN  0.5 inch Topical Q6H   sodium chloride flush  10-40 mL Intracatheter Q12H   sodium chloride flush  3 mL Intravenous Q12H   Continuous Infusions:  sodium chloride     dextrose 75 mL/hr at 04/07/22 0358   dextrose 5 % and 0.45% NaCl 10 mL/hr at 04/06/22 1824   famotidine (PEPCID) IV 20 mg (04/06/22 2059)   methocarbamol (ROBAXIN) IV 500 mg (04/07/22 0519)   ondansetron (  ZOFRAN) IV     piperacillin-tazobactam (ZOSYN)  IV 3.375 g (04/07/22 0732)   promethazine (PHENERGAN) injection (IM or IVPB) 12.5 mg (04/03/22 0429)   TPN ADULT (ION) Stopped (04/07/22 0258)   TPN ADULT (ION)       LOS: 9 days    Time spent: 35 minutes    Barb Merino, MD [facility] Pager [phone]

## 2022-04-07 NOTE — Progress Notes (Signed)
Progress Note  7 Days Post-Op  Subjective: Pt reports rectal bleeding when she stands up - dicussed with RN who confirms that discharge is grossly bloody and not just blood tinged mucus but small volume (~20 cc). Patient denies feeling lightheaded or dizzy. NGT out and patient not vomiting, pulled PICC out accidentally overnight. Having some stool output from stoma now.   Objective: Vital signs in last 24 hours: Temp:  [98.3 F (36.8 C)-98.5 F (36.9 C)] 98.5 F (36.9 C) (01/02 0546) Pulse Rate:  [77-82] 81 (01/02 0546) Resp:  [18] 18 (01/02 0546) BP: (128-148)/(60-76) 128/60 (01/02 0546) SpO2:  [96 %-99 %] 97 % (01/02 0546) Last BM Date : 04/07/22 (from ostomy)  Intake/Output from previous day: 01/01 0701 - 01/02 0700 In: 2126.9 [I.V.:1706.1; IV Piggyback:420.8] Out: 2957 [Urine:2850; Drains:7; Stool:100] Intake/Output this shift: No intake/output data recorded.  PE: General: WD, chronically ill appearing female who is laying in bed in NAD Heart: regular, rate, and rhythm.  Lungs:  Respiratory effort nonlabored Abd: soft, NT, ND, +BS, stoma with stool in ostomy appliance, drain with SS fluid, incisions C/D/I   Lab Results:  Recent Labs    04/05/22 1150 04/06/22 0207  WBC 12.0* 10.8*  HGB 9.0* 8.5*  HCT 28.3* 27.0*  PLT 309 298   BMET Recent Labs    04/06/22 0207 04/07/22 0325  NA 134* 133*  K 3.5 4.2  CL 102 100  CO2 27 26  GLUCOSE 149* 91  BUN 6* 8  CREATININE 0.60 0.52  CALCIUM 7.5* 7.9*   PT/INR No results for input(s): "LABPROT", "INR" in the last 72 hours. CMP     Component Value Date/Time   NA 133 (L) 04/07/2022 0325   NA 140 03/21/2021 1214   K 4.2 04/07/2022 0325   CL 100 04/07/2022 0325   CO2 26 04/07/2022 0325   GLUCOSE 91 04/07/2022 0325   BUN 8 04/07/2022 0325   BUN 21 03/21/2021 1214   CREATININE 0.52 04/07/2022 0325   CREATININE 0.74 03/13/2022 1511   CALCIUM 7.9 (L) 04/07/2022 0325   PROT 5.6 (L) 04/06/2022 0207   ALBUMIN  2.0 (L) 04/06/2022 0207   AST 15 04/06/2022 0207   ALT 11 04/06/2022 0207   ALKPHOS 47 04/06/2022 0207   BILITOT 0.4 04/06/2022 0207   GFRNONAA >60 04/07/2022 0325   GFRAA 85 05/24/2020 1006   Lipase     Component Value Date/Time   LIPASE 25 03/28/2022 1904       Studies/Results: [place] EKG SITE RITE  Result Date: 04/07/2022 If Site Rite image not attached, placement could not be confirmed due to current cardiac rhythm.  CT ABDOMEN PELVIS W CONTRAST  Result Date: 04/06/2022 CLINICAL DATA:  Postop pain, vomiting EXAM: CT ABDOMEN AND PELVIS WITH CONTRAST TECHNIQUE: Multidetector CT imaging of the abdomen and pelvis was performed using the standard protocol following bolus administration of intravenous contrast. RADIATION DOSE REDUCTION: This exam was performed according to the departmental dose-optimization program which includes automated exposure control, adjustment of the mA and/or kV according to patient size and/or use of iterative reconstruction technique. CONTRAST:  100 mL OMNIPAQUE IOHEXOL 300 MG/ML  SOLN COMPARISON:  03/29/2022 FINDINGS: Lower chest: Peripheral interstitial changes consistent with fibrosis. Minimal left-sided pleural effusion. Atheromatous calcifications of the aorta and coronary arteries. No pneumothorax or pericardial effusion identified. Hepatobiliary: Distended gallbladder. No hepatic parenchymal abnormalities. There is suggestion of mild intrahepatic biliary ductal dilatation. Pancreas: Atrophic appearance without evidence of focal lesions or Peripancreatic inflammatory changes. Spleen:  Normal in size without focal abnormality. Adrenals/Urinary Tract: Adrenal glands are unremarkable. Kidneys are normal, without renal calculi, focal lesion, or hydronephrosis. Bladder is empty with a Foley catheter. Stomach/Bowel: Postop changes left-sided partial colectomy. Diverting ostomy left lower quadrant. Mild small bowel dilatation left mid to lower abdomen consistent with ileus.  Vascular/Lymphatic: Aortic atherosclerosis. No enlarged abdominal or pelvic lymph nodes. Reproductive: Status post hysterectomy. No adnexal masses. Other: There is a drain identified with the tip in the posterior pelvis. Fluid collections with air consistent with abscesses that were present previously have diminished. There continues to be evidence of extraluminal air and fluid in the pelvis presacral region. Findings consist of the original abscess measuring up to 5 cm maximum dimension without a well defined wall. Musculoskeletal: No acute or significant osseous findings. IMPRESSION: 1. Pelvic drain in place. Decreased but residual pelvic abscess. Pelvic collection measuring up to 5 cm without well-defined wall. 2. Changes consistent with ileus. 3. Status post sigmoid resection with diverting ostomy. 4. Distended gallbladder and mild intrahepatic ductal dilatation. 5. Small left-sided pleural effusion with evidence of pulmonary interstitial fibrosis. Electronically Signed   By: Sammie Bench M.D.   On: 04/06/2022 12:09   DG Abd Portable 1V  Result Date: 04/05/2022 CLINICAL DATA:  Ileus following gastrointestinal surgery. EXAM: PORTABLE ABDOMEN - 1 VIEW COMPARISON:  One-view abdomen 04/03/2022 FINDINGS: Gas-filled loops of small bowel are present in the mid left abdomen without significant dilation. Gas is present within the colon. Side port of the NG tube is in the proximal stomach. Mild leftward curvature of the thoracolumbar spine is stable. IMPRESSION: Gas-filled loops of small bowel in the mid left abdomen without significant dilation. Findings are compatible with ileus. Electronically Signed   By: San Morelle M.D.   On: 04/05/2022 12:44    Anti-infectives: Anti-infectives (From admission, onward)    Start     Dose/Rate Route Frequency Ordered Stop   04/05/22 1600  piperacillin-tazobactam (ZOSYN) IVPB 3.375 g        3.375 g 12.5 mL/hr over 240 Minutes Intravenous Every 8 hours 04/05/22  1222 04/10/22 1559   03/31/22 1045  cefoTEtan (CEFOTAN) 2 g in sodium chloride 0.9 % 100 mL IVPB        2 g 200 mL/hr over 30 Minutes Intravenous On call to O.R. 03/31/22 0959 03/31/22 1225   03/29/22 0800  piperacillin-tazobactam (ZOSYN) IVPB 3.375 g  Status:  Discontinued        3.375 g 12.5 mL/hr over 240 Minutes Intravenous Every 8 hours 03/29/22 0746 04/05/22 0826        Assessment/Plan POD#7 s/p robotic lower anterior rectosigmoid resection with colostomy and drainage of pelvic abscess by Dr. 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 May be able to remove prior to discharge - WOC following for new ostomy - overall doing well with it. She will need a little more teaching prior to dc   FEN: CLD ID: [email or web address] VTE: hold LMWH   - below per TRH -  Chronic diastolic HF Hypothyroidism Pulmonary fibrosis HTN   LOS: 9 days    Norm Parcel, [facility] 04/07/2022, 10:19 AM Please see Amion for pager number during day hours 7:00am-4:30pm

## 2022-04-07 NOTE — Consult Note (Signed)
Alexandria [name] ostomy follow-up consult note Pt had low anterior resection w/colostomy  Follow-up visit for education and pouch change.   Patient in pain, bedside [name] medicated prior to beginning pouch change.   Stoma type/location: Stoma is red and viable, slightly above skin level, 1 1/2 inches.   Peristomal assessment: intact skin surrounding Output: 40 cc liquid brown stool Ostomy pouching: Applied barrier ring and one piece convex pouch.  5 sets of supplies in the room: use supplies: barrier ring Alexandria [name] # [id number], and convex pouch Lawson # [id number]. Educational materials left at the bedside.    Patient able to verbalize the importance of emptying pouch when 1/3 to 1/2 full.  Patient able to demonstrate emptying of pouch and perform lock and roll.  Also assists Hinton [name] in cleaning ostomy spout.  Patient stated she did not feel she could cut new skin barrier  today due to pain and just receiving pain medications.  However, was able to talk ostomy [name] through steps of cutting new skin barrier, applying barrier ring and securing new pouch to stoma.   Enrolled patient in [place] program: Yes; previously [place] team will follow for further teaching session.   Thank you,  Jaicob Dia MSN, RN-BC, [employer]

## 2022-04-07 NOTE — Progress Notes (Addendum)
Pt accidentally  pulled PICC line out, stated she had a dream. On call Jamison Neighbor made aware via secure chat. Pharmacy made aware as well and received order to start D10 at 7m/hr. Awaiing IV team to place another IV.

## 2022-04-07 NOTE — Progress Notes (Signed)
PHARMACY - TOTAL PARENTERAL NUTRITION CONSULT NOTE   Indication: Prolonged ileus  Patient Measurements: Height: _0  (162.6 cm) Weight: 68.9 kg (151 lb 14.4 oz) IBW/kg (Calculated) : 54.7 TPN AdjBW (KG): 63 Body mass index is 26.07 kg/m. Usual Weight:   Assessment:  Pharmacy is consulted to initiate TPN in 77 yo female with prolonged ileus. Pt  is s/p robotic lower anterior rectosigmoid resection with colostomy and drainage of pelvic abscess by Dr. [name] 12/26.   Glucose / Insulin:  - No history of DM, A1c 5.4 (03/18/20) - Target glucose <150. Range 111-156 - 2 units of insulin given in past 24 hours  Electrolytes: Na low at 133, other elytes improved/WNL Renal: SCr < 1, BUN 8 Hepatic: LFT, alk phos WNL  - albumin low at 2.0 - TG 78 ( 04/05/22)  Intake / Output; MIVF: Net -830 Ml - UOP 2850 mL,  NG output 0 charted, drains 7 mL, stool 100 mL  - D10 at 75 ml/hr  GI Imaging: - 12/24: Current findings are consistent with complicated sigmoid diverticulitis. Distal sigmoid colon shows wall thickening and adjacent inflammation. - 1/1 CT: decreasing residual pelvic abscess, ileus, ostomy, distended gallbladder, small left pleural effusion w/ pulm fibrosis GI Surgeries / Procedures:  - 12/26: robotic lower anterior rectosigmoid resection with colostomy and drainage of pelvic abscess   Central access:   12/30, Pt accidentally pulled out PICC line 1/2 - planning to replace on 1/2.  TPN start date:  12/30   Nutritional Goals: Goal TPN rate is 75 mL/hr (provides 90 g of protein and 1818 kcals per day)  RD Assessment: Estimated Needs Total Energy Estimated Needs: 1700-1900 kcals Total Protein Estimated Needs: 85-100 g Total Fluid Estimated Needs: >/= 1.7 L  Current Nutrition:  NPO  Plan:  Now:  Continue D10 infusion @ 75 ml/hr Follow up plans to replace PICC line  At 18:00 Resume TPN at 75 mL/hr at 1800, the goal rate  Electrolytes in TPN:  Na 178mq/L  K 618m/L  Ca  20m67mL  Mg 4 mEq/L  Phos 20 mmol/L  Cl:Ac 1:1 Add standard MVI and trace elements to TPN Continue  Sensitive q6h SSI and adjust as needed  Thiamine ordered by RD on 12/30. Dose given already on 12/31. Will add to TPN starting 1/1 for a total of 5 days (last day on 1/3) [facility] Monitor TPN labs on Mon/Thurs and as needed.     ChrGretta ArabarmD, BCPS [facility] main pharmacy [id number] 7:12 AM

## 2022-04-07 NOTE — Progress Notes (Signed)
Nutrition Follow-up  INTERVENTION:   -Boost Breeze po TID, each supplement provides 250 kcal and 9 grams of protein while on clear liquids  -If unable to tolerate PO, resume TPN.  NUTRITION DIAGNOSIS:   Inadequate oral intake related to altered GI function, acute illness as evidenced by NPO status.  GOAL:   Patient will meet greater than or equal to 90% of their needs  MONITOR:   Diet advancement, Labs, Weight trends, Skin, I & O's (TPN tolerance)  ASSESSMENT:   77 yo female admitted with diverticulitis with pelvic abscess requiring surgery, developed post op ileus and requiring initiation of TPN. PMH includes CHF, HTN, pulmonary fibrosis  Patient accidentally pulled out PICC.  Pt now on clear liquids. NGT was removed 1/1. Per surgery note, will hold off on TPN at this time.  Will order Boost Breeze for additional kcals and protein while on clear liquid diet.  Admission weight: 138 lbs Current weight: 151 lbs  Medications: D10 infusion, Pepcid  Labs reviewed: CBGs: 111-165  Low Na  Diet Order:   Diet Order             Diet clear liquid Room service appropriate? Yes; Fluid consistency: Thin  Diet effective now                   EDUCATION NEEDS:   Not appropriate for education at this time  Skin:  Skin Assessment: Skin Integrity Issues: Skin Integrity Issues:: Incisions Incisions: abdominal, new colostomy  Last BM:  1/2 colostomy  Height:   Ht Readings from Last 1 Encounters:  03/31/22 '5\' 4"'$  (1.626 m)    Weight:   Wt Readings from Last 1 Encounters:  04/06/22 68.9 kg    BMI:  Body mass index is 26.07 kg/m.  Estimated Nutritional Needs:   Kcal:  1700-1900 kcals  Protein:  85-100 g  Fluid:  >/= 1.7 L  [name], MS, RD, LDN Inpatient Clinical Dietitian Contact information available via Amion

## 2022-04-08 ENCOUNTER — Inpatient Hospital Stay (HOSPITAL_COMMUNITY): Payer: Medicare Other

## 2022-04-08 DIAGNOSIS — K5730 Diverticulosis of large intestine without perforation or abscess without bleeding: Secondary | ICD-10-CM | POA: Diagnosis not present

## 2022-04-08 MED ORDER — LACTATED RINGERS IV BOLUS: 1000.0000 mL | Freq: Three times a day (TID) | INTRAVENOUS | Status: AC | PRN

## 2022-04-08 MED ORDER — LACTATED RINGERS IV BOLUS
1000.0000 mL | Freq: Once | INTRAVENOUS | Status: AC
  Administered 2022-04-08: 1000 mL via INTRAVENOUS

## 2022-04-08 MED ORDER — GABAPENTIN 100 MG PO CAPS: 300.0000 mg | ORAL_CAPSULE | Freq: Every day | ORAL | Status: DC

## 2022-04-08 MED ORDER — THIAMINE MONONITRATE 100 MG PO TABS: 100.0000 mg | ORAL_TABLET | Freq: Every day | ORAL | Status: DC

## 2022-04-08 MED ORDER — METOCLOPRAMIDE HCL 5 MG/ML IJ SOLN
5.0000 mg | Freq: Three times a day (TID) | INTRAMUSCULAR | Status: DC | PRN
  Administered 2022-04-09 – 2022-04-12 (×5): 10 mg via INTRAVENOUS
  Filled 2022-04-08 (×5): qty 2

## 2022-04-08 MED ORDER — ADULT MULTIVITAMIN W/MINERALS CH: 1.0000 | ORAL_TABLET | Freq: Every day | ORAL | Status: DC

## 2022-04-08 MED ORDER — LORAZEPAM 2 MG/ML IJ SOLN: 0.5000 mg | Freq: Three times a day (TID) | INTRAMUSCULAR | Status: DC | PRN

## 2022-04-08 MED ORDER — ACETAMINOPHEN-CODEINE 300-30 MG PO TABS: 2.0000 | ORAL_TABLET | ORAL | Status: DC | PRN

## 2022-04-08 MED ORDER — ACETAMINOPHEN 500 MG PO TABS: 1000.0000 mg | ORAL_TABLET | Freq: Four times a day (QID) | ORAL | Status: DC

## 2022-04-08 MED ORDER — ONDANSETRON HCL 4 MG PO TABS
4.0000 mg | ORAL_TABLET | Freq: Three times a day (TID) | ORAL | Status: AC
  Filled 2022-04-08: qty 1

## 2022-04-08 MED ORDER — CALCIUM POLYCARBOPHIL 625 MG PO TABS
625.0000 mg | ORAL_TABLET | Freq: Two times a day (BID) | ORAL | Status: DC
  Administered 2022-04-08: 625 mg via ORAL
  Filled 2022-04-08: qty 1

## 2022-04-08 MED ORDER — METHOCARBAMOL 1000 MG/10ML IJ SOLN: 500.0000 mg | Freq: Three times a day (TID) | INTRAVENOUS | Status: DC

## 2022-04-08 MED ORDER — SIMETHICONE 80 MG PO CHEW
80.0000 mg | CHEWABLE_TABLET | Freq: Four times a day (QID) | ORAL | Status: AC
  Administered 2022-04-08: 80 mg via ORAL
  Filled 2022-04-08: qty 1

## 2022-04-08 MED ORDER — ACETAMINOPHEN 10 MG/ML IV SOLN
1000.0000 mg | Freq: Four times a day (QID) | INTRAVENOUS | Status: AC
  Administered 2022-04-08 – 2022-04-09 (×4): 1000 mg via INTRAVENOUS
  Filled 2022-04-08 (×4): qty 100

## 2022-04-08 MED ORDER — FENTANYL CITRATE PF 50 MCG/ML IJ SOSY
12.5000 ug | PREFILLED_SYRINGE | INTRAMUSCULAR | Status: DC | PRN
  Administered 2022-04-08 (×2): 12.5 ug via INTRAVENOUS
  Administered 2022-04-09 (×2): 25 ug via INTRAVENOUS
  Administered 2022-04-09 (×2): 12.5 ug via INTRAVENOUS
  Administered 2022-04-10 (×2): 25 ug via INTRAVENOUS
  Administered 2022-04-10: 12.5 ug via INTRAVENOUS
  Administered 2022-04-10 – 2022-04-11 (×9): 25 ug via INTRAVENOUS
  Administered 2022-04-11: 12.5 ug via INTRAVENOUS
  Administered 2022-04-11 – 2022-04-12 (×7): 25 ug via INTRAVENOUS
  Administered 2022-04-12: 12.5 ug via INTRAVENOUS
  Administered 2022-04-13: 25 ug via INTRAVENOUS
  Administered 2022-04-13: 12.5 ug via INTRAVENOUS
  Administered 2022-04-13: 25 ug via INTRAVENOUS
  Administered 2022-04-13: 12.5 ug via INTRAVENOUS
  Administered 2022-04-13 – 2022-04-15 (×7): 25 ug via INTRAVENOUS
  Filled 2022-04-08 (×38): qty 1

## 2022-04-08 MED ORDER — KETOROLAC TROMETHAMINE 15 MG/ML IJ SOLN
15.0000 mg | Freq: Three times a day (TID) | INTRAMUSCULAR | Status: DC
  Administered 2022-04-08 – 2022-04-09 (×3): 15 mg via INTRAVENOUS
  Filled 2022-04-08 (×3): qty 1

## 2022-04-08 NOTE — Progress Notes (Signed)
PHARMACY - TOTAL PARENTERAL NUTRITION CONSULT NOTE   Indication: Prolonged ileus  Patient Measurements: Height: 5' 4" (162.6 cm) Weight: 70.6 kg (155 lb 10.3 oz) IBW/kg (Calculated) : 54.7 TPN AdjBW (KG): 63 Body mass index is 26.72 kg/m. Usual Weight:   Assessment:  Pharmacy is consulted to initiate TPN in 77 yo female with prolonged ileus. Pt  is s/p robotic lower anterior rectosigmoid resection with colostomy and drainage of pelvic abscess by Dr. [name] 12/26.   Glucose / Insulin:  No history of DM, A1c 5.4 (03/18/20)  Electrolytes: Last on 04/07/22 - WNL except Na 133 Renal: SCr < 1, BUN 8 Hepatic: LFT, alk phos WNL  - albumin low at 2.0 - TG 78 ( 04/05/22)  Intake / Output; MIVF: Net -487 mL - D5-0.45%NaCl @ 10 ml/hr GI Imaging: - 12/24: Current findings are consistent with complicated sigmoid diverticulitis. Distal sigmoid colon shows wall thickening and adjacent inflammation. - 1/1 CT: decreasing residual pelvic abscess, ileus, ostomy, distended gallbladder, small left pleural effusion w/ pulm fibrosis GI Surgeries / Procedures:  - 12/26: robotic lower anterior rectosigmoid resection with colostomy and drainage of pelvic abscess   Central access:   12/30, Pt accidentally pulled out PICC line 1/2 - ordered to replace on 1/2.  TPN start date:  12/30   Nutritional Goals: Goal TPN rate is 75 mL/hr (provides 90 g of protein and 1818 kcals per day)  RD Assessment: Estimated Needs Total Energy Estimated Needs: 1700-1900 kcals Total Protein Estimated Needs: 85-100 g Total Fluid Estimated Needs: >/= 1.7 L  Current Nutrition:  Clear liquids  Plan:  - TPN held on 1/2 at ~ 3am when PICC line was accidentally removed.  PICC line was not replaced and trial of CLD started. - Per CCS, ok to continue trial of enteral nutrition and d/c TPN.  - Change to PO MVI and Thiamine x5 days - d/c TPN insulin checks, SSI, and labs - IV fluids per MD - Please reconsult pharmacy for further  TPN needs.     Gretta Arab PharmD, BCPS [facility] main pharmacy [phone] 04/08/2022 10:18 AM

## 2022-04-08 NOTE — TOC Initial Note (Signed)
Transition of Care [facility]) - Initial/Assessment Note    Patient Details  Name: Alexandria [name] MRN: [id number] Date of Birth: 1945/11/14  Transition of Care [facility]) CM/SW Contact:    Lennart Pall, LCSW Phone Number: 04/08/2022, 2:36 PM  Clinical Narrative:                  Met with pt today to introduce TOC/ CSW role with dc planning needs.  Pt agreeable to speak with me, however, reports having ongoing pain and visibly grimacing during our brief discussion.  Pt reports that her husband is in the home, but, does have "early Alzheimers".  She reports that he is, however, still driving and completely independent in the home and can provide some assistance to her if needed.  We discussed her own current limitations and possibility she may need some follow up therapy or RN visits at home.  She is agreeable with TOC to follow to assist with any needs identified.  Expected Discharge Plan: [place] Barriers to Discharge: Continued Medical Work up   Patient Goals and CMS Choice Patient states their goals for this hospitalization and ongoing recovery are:: return home; pain controlled          Expected Discharge Plan and Services In-house Referral: Clinical Social Work     Living arrangements for the past 2 months: Single Family Home                                      Prior Living Arrangements/Services Living arrangements for the past 2 months: Single Family Home Lives with:: Spouse Patient language and need for interpreter reviewed:: Yes Do you feel safe going back to the place where you live?: Yes      Need for Family Participation in Patient Care: Yes (Comment) Care giver support system in place?: Yes (comment)   Criminal Activity/Legal Involvement Pertinent to Current Situation/Hospitalization: No - Comment as needed  Activities of Daily Living Home Assistive Devices/Equipment: None ADL Screening (condition at time of admission) Patient's  cognitive ability adequate to safely complete daily activities?: Yes Is the patient deaf or have difficulty hearing?: No Does the patient have difficulty seeing, even when wearing glasses/contacts?: No Does the patient have difficulty concentrating, remembering, or making decisions?: No Patient able to express need for assistance with ADLs?: Yes Does the patient have difficulty dressing or bathing?: No Independently performs ADLs?: Yes (appropriate for developmental age) Does the patient have difficulty walking or climbing stairs?: No Weakness of Legs: None Weakness of Arms/Hands: None  Permission Sought/Granted Permission sought to share information with : Family Supports Permission granted to share information with : Yes, Verbal Permission Granted  Share Information with NAME: Shakela Donati     Permission granted to share info w Relationship: son  Permission granted to share info w Contact Information: [phone]  Emotional Assessment Appearance:: Appears stated age Attitude/Demeanor/Rapport: Engaged Affect (typically observed): Frustrated Orientation: : Oriented to Self, Oriented to Place, Oriented to  Time, Oriented to Situation Alcohol / Substance Use: Not Applicable Psych Involvement: No (comment)  Admission diagnosis:  Other constipation [K59.09] Nausea [R11.0] Rectal mass [K62.89] Patient Active Problem List   Diagnosis Date Noted   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 78/67/5449   Pelvic abscess in female 03/30/2022   Allergic reaction caused by a drug 03/29/2022   Flank pain 03/13/2022   Lower abdominal pain 03/13/2022  Abnormal urine findings 03/13/2022   Urinary frequency 01/02/2022   Light headed 01/02/2022   Flat foot 11/19/2021   Pain of right orbit 11/15/2021   S/P total knee arthroplasty, left 11/13/2021   Ankle impingement syndrome, left 11/13/2021   Rectal bleed 08/22/2021   Abnormal thyroid blood test 08/22/2021   Abnormal  kidney function 08/22/2021   Bronchitis 07/18/2021   Dumping syndrome 03/13/2020   Hyperglycemia 03/13/2020   Arthritis    Bronchial pneumonia    Colon polyp    DVT (deep venous thrombosis) (HCC)    Heart murmur    Migraines    Pancreatitis    Thyroid disease    Aortic root dilatation ([place]) 11/24/2019   Leukocytosis 11/06/2019   Diarrhea 11/06/2019   Generalized abdominal pain 11/06/2019   Pre-ulcerative corn or callous 09/07/2019   Nausea 09/07/2019   Pain of left calf 05/09/2019   Tibial pain 05/09/2019   Essential hypertension 02/08/2019   Healthcare maintenance 11/10/2018   Bradycardia 07/22/2018   Pansinusitis 06/23/2018   History of transient ischemic attack (TIA) 03/24/2018   IPF (idiopathic pulmonary fibrosis) ([place]) 03/24/2018   Allergic rhinitis 02/16/2018   Eustachian tube dysfunction, bilateral 02/16/2018   URI (upper respiratory infection) 02/16/2018   Seasonal allergic rhinitis due to pollen 02/16/2018   ILD (interstitial lung disease) ([place]) 01/07/2018   Dyspnea 01/07/2018   Acute respiratory failure ([place]) 01/06/2018   TIA (transient ischemic attack) 01/06/2018   Chronic hypokalemia 12/15/2017   Chronic cough 11/26/2017   Basal cell carcinoma (BCC) of neck 06/07/2017   Anisometropia 06/02/2017   [place] of macula of both eyes 06/02/2017   Photopsia of left eye 06/02/2017   Cardiac murmur 40/98/1191   Diastolic dysfunction 47/82/9562   Situational anxiety 05/07/2017   Headache 05/07/2017   Mitral and aortic insufficiency 05/07/2017   Cellulitis of left lower extremity 04/30/2017   Migraine without status migrainosus, not intractable 06/09/2016   Insomnia 11/18/2015   Normal coronary arteries 05/20/2015   Chest pain at rest 05/20/2015   RBBB 05/20/2015   Abnormal CXR 05/20/2015   Abnormal CT of the chest 05/20/2015   Hx of myocardial infarction 05/08/2015   Bruising 05/08/2015   Upper airway cough syndrome 02/14/2015   Asthmatic bronchitis with acute  exacerbation 02/06/2015   Mild intermittent asthma without complication 13/11/6576   Oliguria 12/28/2014   Hypothyroidism 10/25/2014   Fatigue 10/25/2014   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   Chronic venous insufficiency 10/24/2014   Varicose veins of leg with complications 46/96/2952   Degeneration of intervertebral disc of cervical region 06/29/2014   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   Generalized osteoarthritis of multiple sites 06/29/2014   History of stroke without residual deficits 06/29/2014   Palpitations 11/08/2013   SOB (shortness of breath) 10/03/2013   DOE (dyspnea on exertion) 10/03/2013   Edema of both legs 10/03/2013   Edema of lower extremity 10/03/2013   Subacute maxillary sinusitis 06/17/2013   Hair loss 11/25/2012   Alopecia 11/25/2012   Diverticulitis 07/14/2012   Diverticulitis of intestine 07/14/2012   Hammer toe 06/13/2012   Recurrent abdominal pain 04/17/2012   Abdominal pain 01/06/2012   Hypoglycemia 01/06/2012   History of stress test 05/21/2011   Hx of echocardiogram 01/27/2010   H/O cardiac catheterization 2004   PCP:  [name], DO Pharmacy:   [id number] [place], [place] - [address]Phone: [phone]: [phone] [facility]  [address] [place] [address] Phone: [phone] Fax: [phone]  [place] [address] [place] [address] Phone: [phone] Fax: [phone]     Social Determinants of Health (SDOH) Social History: [place]: No Food Insecurity (03/31/2022)  Housing: Low Risk  (03/31/2022)  Transportation Needs: No Transportation Needs (03/31/2022)  Utilities: Not At Risk (03/31/2022)  Depression (PHQ2-9): Low Risk   (02/10/2022)  Tobacco Use: Low Risk  (04/01/2022)   SDOH Interventions: Food Insecurity Interventions: Intervention Not Indicated Housing Interventions: Intervention Not Indicated Transportation Interventions: Intervention Not Indicated Utilities Interventions: Intervention Not Indicated   Readmission Risk Interventions    04/08/2022    2:29 PM 04/03/2022    8:29 AM  Readmission Risk Prevention Plan  Post Dischage Appt  Complete  Medication Screening  Complete  Transportation Screening Complete Complete  PCP or Specialist Appt within 5-7 Days Complete   Home Care Screening Complete   Medication Review (RN CM) Complete

## 2022-04-08 NOTE — Progress Notes (Signed)
Progress Note  8 Days Post-Op  Subjective: Pt reports some nausea this AM. Had ostomy output yesterday but none this AM on my exam. Some abdominal pain with mobilization although patient has been refusing mobilization because she is worried about drainage from rectum. Per RN drainage from rectum is slowed overnight.    Objective: Vital signs in last 24 hours: Temp:  [98 F (36.7 C)-98.1 F (36.7 C)] 98 F (36.7 C) (01/03 0548) Pulse Rate:  [68-78] 68 (01/03 0548) Resp:  [16-18] 18 (01/03 0548) BP: (121-153)/(64-65) 153/65 (01/03 0548) SpO2:  [95 %-98 %] 97 % (01/03 0548) Weight:  [70.6 kg] 70.6 kg (01/03 0500) Last BM Date : 04/07/22  Intake/Output from previous day: 01/02 0701 - 01/03 0700 In: 2042.3 [P.O.:840; I.V.:835.5; IV Piggyback:366.9] Out: 2530 [Urine:2500; Drains:30] Intake/Output this shift: No intake/output data recorded.  PE: General: WD, chronically ill appearing female who is laying in bed in NAD Heart: regular, rate, and rhythm.  Lungs:  Respiratory effort nonlabored Abd: soft, NT, ND, +BS, stoma viable, incisions C/D/I   Lab Results:  Recent Labs    04/06/22 0207 04/07/22 1041  WBC 10.8* 11.2*  HGB 8.5* 9.3*  HCT 27.0* 30.5*  PLT 298 339    BMET Recent Labs    04/06/22 0207 04/07/22 0325  NA 134* 133*  K 3.5 4.2  CL 102 100  CO2 27 26  GLUCOSE 149* 91  BUN 6* 8  CREATININE 0.60 0.52  CALCIUM 7.5* 7.9*    PT/INR No results for input(s): "LABPROT", "INR" in the last 72 hours. CMP     Component Value Date/Time   NA 133 (L) 04/07/2022 0325   NA 140 03/21/2021 1214   K 4.2 04/07/2022 0325   CL 100 04/07/2022 0325   CO2 26 04/07/2022 0325   GLUCOSE 91 04/07/2022 0325   BUN 8 04/07/2022 0325   BUN 21 03/21/2021 1214   CREATININE 0.52 04/07/2022 0325   CREATININE 0.74 03/13/2022 1511   CALCIUM 7.9 (L) 04/07/2022 0325   PROT 5.6 (L) 04/06/2022 0207   ALBUMIN 2.0 (L) 04/06/2022 0207   AST 15 04/06/2022 0207   ALT 11 04/06/2022  0207   ALKPHOS 47 04/06/2022 0207   BILITOT 0.4 04/06/2022 0207   GFRNONAA >60 04/07/2022 0325   GFRAA 85 05/24/2020 1006   Lipase     Component Value Date/Time   LIPASE 25 03/28/2022 1904       Studies/Results: [place] EKG SITE RITE  Result Date: 04/07/2022 If Site Rite image not attached, placement could not be confirmed due to current cardiac rhythm.  CT ABDOMEN PELVIS W CONTRAST  Result Date: 04/06/2022 CLINICAL DATA:  Postop pain, vomiting EXAM: CT ABDOMEN AND PELVIS WITH CONTRAST TECHNIQUE: Multidetector CT imaging of the abdomen and pelvis was performed using the standard protocol following bolus administration of intravenous contrast. RADIATION DOSE REDUCTION: This exam was performed according to the departmental dose-optimization program which includes automated exposure control, adjustment of the mA and/or kV according to patient size and/or use of iterative reconstruction technique. CONTRAST:  100 mL OMNIPAQUE IOHEXOL 300 MG/ML  SOLN COMPARISON:  03/29/2022 FINDINGS: Lower chest: Peripheral interstitial changes consistent with fibrosis. Minimal left-sided pleural effusion. Atheromatous calcifications of the aorta and coronary arteries. No pneumothorax or pericardial effusion identified. Hepatobiliary: Distended gallbladder. No hepatic parenchymal abnormalities. There is suggestion of mild intrahepatic biliary ductal dilatation. Pancreas: Atrophic appearance without evidence of focal lesions or Peripancreatic inflammatory changes. Spleen: Normal in size without focal abnormality. Adrenals/Urinary Tract: Adrenal  glands are unremarkable. Kidneys are normal, without renal calculi, focal lesion, or hydronephrosis. Bladder is empty with a Foley catheter. Stomach/Bowel: Postop changes left-sided partial colectomy. Diverting ostomy left lower quadrant. Mild small bowel dilatation left mid to lower abdomen consistent with ileus. Vascular/Lymphatic: Aortic atherosclerosis. No enlarged abdominal or  pelvic lymph nodes. Reproductive: Status post hysterectomy. No adnexal masses. Other: There is a drain identified with the tip in the posterior pelvis. Fluid collections with air consistent with abscesses that were present previously have diminished. There continues to be evidence of extraluminal air and fluid in the pelvis presacral region. Findings consist of the original abscess measuring up to 5 cm maximum dimension without a well defined wall. Musculoskeletal: No acute or significant osseous findings. IMPRESSION: 1. Pelvic drain in place. Decreased but residual pelvic abscess. Pelvic collection measuring up to 5 cm without well-defined wall. 2. Changes consistent with ileus. 3. Status post sigmoid resection with diverting ostomy. 4. Distended gallbladder and mild intrahepatic ductal dilatation. 5. Small left-sided pleural effusion with evidence of pulmonary interstitial fibrosis. Electronically Signed   By: Sammie Bench M.D.   On: 04/06/2022 12:09    Anti-infectives: Anti-infectives (From admission, onward)    Start     Dose/Rate Route Frequency Ordered Stop   04/05/22 1600  piperacillin-tazobactam (ZOSYN) IVPB 3.375 g        3.375 g 12.5 mL/hr over 240 Minutes Intravenous Every 8 hours 04/05/22 1222 04/10/22 1559   03/31/22 1045  cefoTEtan (CEFOTAN) 2 g in sodium chloride 0.9 % 100 mL IVPB        2 g 200 mL/hr over 30 Minutes Intravenous On call to O.R. 03/31/22 0959 03/31/22 1225   03/29/22 0800  piperacillin-tazobactam (ZOSYN) IVPB 3.375 g  Status:  Discontinued        3.375 g 12.5 mL/hr over 240 Minutes Intravenous Every 8 hours 03/29/22 0746 04/05/22 0826        Assessment/Plan POD#8 s/p robotic lower anterior rectosigmoid resection with colostomy and drainage of pelvic abscess by Dr. 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, probable diverticulitis  - pathology is diverticulitis no malignancy - CT AP1/1 with ileus, pelvic drain in place with  decreased but residual pelvix abscess - continue CLD while patient with some nausea this AM - Pt reports rectal bleeding - CBC stable, continue to monitor  - drain removed  - WOC following for new ostomy - overall doing well with it. She will need a little more teaching prior to dc   FEN: CLD ID: [email or web address] 12/31>1/5 VTE: hold LMWH   - below per TRH -  Chronic diastolic HF Hypothyroidism Pulmonary fibrosis HTN   LOS: 10 days    Norm Parcel, [facility] 04/08/2022, 10:08 AM Please see Amion for pager number during day hours 7:00am-4:30pm

## 2022-04-08 NOTE — Progress Notes (Signed)
Mobility Specialist - Progress Note   04/08/22 0923  Mobility  Activity Transferred to/from [facility]  Level of Assistance Minimal assist, patient does 75% or more  Assistive Device Front wheel walker  Distance Ambulated (ft) 4 ft  Range of Motion/Exercises Active  Activity Response Tolerated well  Mobility Referral Yes  $Mobility charge 1 Mobility   Pt was found in bed and agreeable to try ambulation. Pt stated feeling nauseous and wanting to go to the [facility] due to feeling pressure on her abdomin. She was transferred to [facility] stated she felt a lot of pressure on her abdomin and that she felt as though she was going to pass out. Pt was asked if she could make it back to bed without passing out and she stated she could. Pt returned to bed with necessities in reach. RN notified of session.  Ferd Hibbs Mobility Specialist

## 2022-04-08 NOTE — Progress Notes (Signed)
PROGRESS NOTE    Alexandria [name]  CHE:[id number] DOB: Mar 25, 1946 DOA: 03/28/2022 PCP: [name], DO    Brief Narrative:  Alexandria [name] is a 77 y/o with a history of hypothyroidism, grade I DD, HTN, IPF, diverticulosis, diverticulitis, DVT, chronic hypokalemia who has a long history of constipation and has recently tried Linzess but stopped after 2 weeks due to lack of efficacy by her report. She recently got CT abd/pelvis 03/20/22 which revealed an enhancing lesion/mass just below the sigmoid-rectal junction. Flex Sig was performed 03/20/22 which revealed a polypoid mass at the sigmoid-rectal junction. Bx was readout by pathology as an inflammatory poly w/o dysplasia or malignancy. Patient had MRI abd/pelvis 03/26/22 -showed severe sigmoid diverticulitis.  Due to continued constipation and diffuse abdominal pain, worst in the LUQ, she presented to [facility] for evaluation. General surgery was consulted. Patient underwent repeat CT abd/pelvis and thought to have complicated diverticulitis with abscess. Unable to drain per IR.  Ultimately, she underwent rectosigmoid resection with colostomy by Dr. [name] 12/26.  With poor recovery postop.     Assessment & Plan:   Complicated sigmoid diverticulitis with pelvic abscess: Persistent ileus. Rectosigmoid resection and colostomy Dr. [name] 12/26.  IV fluids.  On IV Zosyn.  Biopsy with evidence of diverticulitis with pericolonic abscess and perforation.  No malignancy. Started on TPN with PICC line and had NG tube inserted.  Blood came out.  Now on clear liquid trial. Trying low potentially codeine and Toradol. Continues to have rectal bleeding, intermittent likely from the stump.  Symptomatic anemia due to postop blood loss: Previous hemoglobin 12-13.  Hemoglobin dropped to 7.3-1 unit of PRBC-9.8-7.7-8.5-9.3.  Adequate.  Hospital-acquired delirium: Patient is developing impulsiveness and occasional confusion.  Likely related to  hospitalization, medications and severe medical illness. Frequent reorientation.  Chronic medical issues including Chronic diastolic congestive heart failure, diuresis on hold.  On maintenance IV fluids.  Euvolemic. Hypothyroidism, unreliable oral intake.  Continue IV Synthroid. Idiopathic pulmonary fibrosis: Without exacerbation. Hypokalemia: Replaced and adequate. Hypomagnesemia: Replaced. Hypophosphatemia: Replaced  Urinary retention: Secondary to pelvic pain and immobility.  DC Foley catheter for trial today.  Rectal bleeding and secretions: Anticipated from remaining rectal stump and inflammation.  Monitor.   DVT prophylaxis: enoxaparin (LOVENOX) injection 40 mg Start: 04/06/22 1000 Place and maintain sequential compression device Start: 03/30/22 0940   Code Status: Full code Family Communication: None at the bedside. Disposition Plan: Status is: Inpatient Remains inpatient appropriate because: Significant abdominal pain and distention, recovery from surgery.  Persistent ileus.     Consultants:  General surgery  Procedures:  Rectosigmoid resection and colostomy  Antimicrobials:  IV Zosyn 12/23---   Subjective:  Seen and examined.  Nervous.  Worried about rectal bleeding that she notices every time she moves around.  Denies any nausea but has no appetite.  There is no stool in the colostomy bag overnight.  Pain is controlled.  Every pain medicine causes problem.  Objective: Vitals:   04/07/22 1344 04/07/22 2027 04/08/22 0500 04/08/22 0548  BP: 121/64 136/65  (!) 153/65  Pulse: 73 78  68  Resp: '16 18  18  '$ Temp: 98 F (36.7 C) 98.1 F (36.7 C)  98 F (36.7 C)  TempSrc: Oral Oral  Oral  SpO2: 98% 95%  97%  Weight:   70.6 kg   Height:        Intake/Output Summary (Last 24 hours) at 04/08/2022 1045 Last data filed at 04/08/2022 1000 Gross per 24 hour  Intake 1681.6  ml  Output 2580 ml  Net -898.4 ml    Filed Weights   03/31/22 1057 04/06/22 0550 04/08/22  0500  Weight: 63 kg 68.9 kg 70.6 kg    Examination:  General exam: Appears in mild distress with mobility.  Anxious.  Alert oriented x 4 today. Respiratory system: Clear to auscultation. Respiratory effort normal.  No added sounds. Cardiovascular system: S1 & S2 heard, RRR.  Gastrointestinal system: Abdomen is mildly tender.   Colostomy bag with no stool.  Bowel sounds present. Incisions intact. Central nervous system: Alert and oriented x 3-4. More composed and interactive today. Extremities: Symmetric 5 x 5 power.    Data Reviewed: I have personally reviewed following labs and imaging studies  CBC: Recent Labs  Lab 04/04/22 0611 04/04/22 1431 04/05/22 1150 04/06/22 0207 04/07/22 1041  WBC 6.6 7.8 12.0* 10.8* 11.2*  NEUTROABS 3.9  --   --   --   --   HGB 7.7* 8.5* 9.0* 8.5* 9.3*  HCT 24.1* 26.5* 28.3* 27.0* 30.5*  MCV 100.4* 99.6 100.0 101.1* 103.7*  PLT 265 341 309 298 440    Basic Metabolic Panel: Recent Labs  Lab 04/03/22 0517 04/04/22 0611 04/05/22 0208 04/06/22 0207 04/07/22 0325  NA 134* 136 136 134* 133*  K 3.2* 3.6 3.5 3.5 4.2  CL 98 103 102 102 100  CO2 '29 26 28 27 26  '$ GLUCOSE 129* 80 132* 149* 91  BUN '15 9 8 '$ 6* 8  CREATININE 0.81 0.68 0.64 0.60 0.52  CALCIUM 8.9 7.4* 7.5* 7.5* 7.9*  MG  --  1.6* 2.2 2.4 2.3  PHOS  --  2.0* 2.2* 2.7 2.7    GFR: Estimated Creatinine Clearance: 56.8 mL/min (by C-G formula based on SCr of 0.52 mg/dL). Liver Function Tests: Recent Labs  Lab 04/04/22 0611 04/05/22 0208 04/06/22 0207  AST '19 20 15  '$ ALT '11 13 11  '$ ALKPHOS 44 46 47  BILITOT 0.4 0.6 0.4  PROT 4.5* 5.2* 5.6*  ALBUMIN <1.5* 1.8* 2.0*    No results for input(s): "LIPASE", "AMYLASE" in the last 168 hours.  No results for input(s): "AMMONIA" in the last 168 hours. Coagulation Profile: No results for input(s): "INR", "PROTIME" in the last 168 hours.  Cardiac Enzymes: No results for input(s): "CKTOTAL", "CKMB", "CKMBINDEX", "TROPONINI" in the last  168 hours. BNP (last 3 results) No results for input(s): "PROBNP" in the last 8760 hours. HbA1C: No results for input(s): "HGBA1C" in the last 72 hours.  CBG: Recent Labs  Lab 04/06/22 2346 04/07/22 0523 04/07/22 1203 04/07/22 1726 04/07/22 2356  GLUCAP 125* 111* 149* 140* 91    Lipid Profile: No results for input(s): "CHOL", "HDL", "LDLCALC", "TRIG", "CHOLHDL", "LDLDIRECT" in the last 72 hours.  Thyroid Function Tests: No results for input(s): "TSH", "T4TOTAL", "FREET4", "T3FREE", "THYROIDAB" in the last 72 hours. Anemia Panel: No results for input(s): "VITAMINB12", "FOLATE", "FERRITIN", "TIBC", "IRON", "RETICCTPCT" in the last 72 hours. Sepsis Labs: No results for input(s): "PROCALCITON", "LATICACIDVEN" in the last 168 hours.  No results found for this or any previous visit (from the past 240 hour(s)).       Radiology Studies: [place] EKG SITE RITE  Result Date: 04/07/2022 If Site Rite image not attached, placement could not be confirmed due to current cardiac rhythm.  CT ABDOMEN PELVIS W CONTRAST  Result Date: 04/06/2022 CLINICAL DATA:  Postop pain, vomiting EXAM: CT ABDOMEN AND PELVIS WITH CONTRAST TECHNIQUE: Multidetector CT imaging of the abdomen and pelvis was performed using the standard  protocol following bolus administration of intravenous contrast. RADIATION DOSE REDUCTION: This exam was performed according to the departmental dose-optimization program which includes automated exposure control, adjustment of the mA and/or kV according to patient size and/or use of iterative reconstruction technique. CONTRAST:  100 mL OMNIPAQUE IOHEXOL 300 MG/ML  SOLN COMPARISON:  03/29/2022 FINDINGS: Lower chest: Peripheral interstitial changes consistent with fibrosis. Minimal left-sided pleural effusion. Atheromatous calcifications of the aorta and coronary arteries. No pneumothorax or pericardial effusion identified. Hepatobiliary: Distended gallbladder. No hepatic parenchymal  abnormalities. There is suggestion of mild intrahepatic biliary ductal dilatation. Pancreas: Atrophic appearance without evidence of focal lesions or Peripancreatic inflammatory changes. Spleen: Normal in size without focal abnormality. Adrenals/Urinary Tract: Adrenal glands are unremarkable. Kidneys are normal, without renal calculi, focal lesion, or hydronephrosis. Bladder is empty with a Foley catheter. Stomach/Bowel: Postop changes left-sided partial colectomy. Diverting ostomy left lower quadrant. Mild small bowel dilatation left mid to lower abdomen consistent with ileus. Vascular/Lymphatic: Aortic atherosclerosis. No enlarged abdominal or pelvic lymph nodes. Reproductive: Status post hysterectomy. No adnexal masses. Other: There is a drain identified with the tip in the posterior pelvis. Fluid collections with air consistent with abscesses that were present previously have diminished. There continues to be evidence of extraluminal air and fluid in the pelvis presacral region. Findings consist of the original abscess measuring up to 5 cm maximum dimension without a well defined wall. Musculoskeletal: No acute or significant osseous findings. IMPRESSION: 1. Pelvic drain in place. Decreased but residual pelvic abscess. Pelvic collection measuring up to 5 cm without well-defined wall. 2. Changes consistent with ileus. 3. Status post sigmoid resection with diverting ostomy. 4. Distended gallbladder and mild intrahepatic ductal dilatation. 5. Small left-sided pleural effusion with evidence of pulmonary interstitial fibrosis. Electronically Signed   By: Sammie Bench M.D.   On: 04/06/2022 12:09        Scheduled Meds:  acetaminophen  1,000 mg Oral Q6H   Chlorhexidine Gluconate Cloth  6 each Topical Daily   enoxaparin (LOVENOX) injection  40 mg Subcutaneous Q24H   feeding supplement  1 Container Oral TID BM   gabapentin  300 mg Oral QHS   ketorolac  15-30 mg Intravenous Q8H   [START ON 04/09/2022]  levothyroxine  84 mcg Intravenous Daily   lidocaine  1 patch Transdermal Q24H   multivitamin with minerals  1 tablet Oral Daily   Nintedanib  100 mg Oral Q12H   nitroGLYCERIN  0.5 inch Topical Q6H   ondansetron  4 mg Oral TID AC & HS   polycarbophil  625 mg Oral BID   simethicone  80 mg Oral QID   sodium chloride flush  10-40 mL Intracatheter Q12H   sodium chloride flush  3 mL Intravenous Q12H   thiamine  100 mg Oral Daily   Continuous Infusions:  sodium chloride     dextrose 5 % and 0.45% NaCl 10 mL/hr at 04/06/22 1824   famotidine (PEPCID) IV 20 mg (04/08/22 1036)   lactated ringers     ondansetron (ZOFRAN) IV     piperacillin-tazobactam (ZOSYN)  IV 3.375 g (04/08/22 0722)   promethazine (PHENERGAN) injection (IM or IVPB) 12.5 mg (04/03/22 0429)     LOS: 10 days    Time spent: 35 minutes    Barb Merino, MD [facility] Pager [phone]

## 2022-04-09 ENCOUNTER — Inpatient Hospital Stay: Payer: Self-pay

## 2022-04-09 DIAGNOSIS — K5730 Diverticulosis of large intestine without perforation or abscess without bleeding: Secondary | ICD-10-CM | POA: Diagnosis not present

## 2022-04-09 LAB — BASIC METABOLIC PANEL
Anion gap: 6 (ref 5–15)
BUN: 6 mg/dL — ABNORMAL LOW (ref 8–23)
CO2: 26 mmol/L (ref 22–32)
Calcium: 8 mg/dL — ABNORMAL LOW (ref 8.9–10.3)
Chloride: 103 mmol/L (ref 98–111)
Creatinine, Ser: 0.83 mg/dL (ref 0.44–1.00)
GFR, Estimated: >60 mL/min (ref >60)
Glucose, Bld: 97 mg/dL (ref 70–99)
Potassium: 3.8 mmol/L (ref 3.5–5.1)
Sodium: 135 mmol/L (ref 135–145)

## 2022-04-09 LAB — CBC
HCT: 28.6 % — ABNORMAL LOW (ref 36.0–46.0)
Hemoglobin: 9.1 g/dL — ABNORMAL LOW (ref 12.0–15.0)
MCH: 31.7 pg (ref 26.0–34.0)
MCHC: 31.8 g/dL (ref 30.0–36.0)
MCV: 99.7 fL (ref 80.0–100.0)
Platelets: 405 10*3/uL — ABNORMAL HIGH (ref 150–400)
RBC: 2.87 MIL/uL — ABNORMAL LOW (ref 3.87–5.11)
RDW: 15.1 % (ref 11.5–15.5)
WBC: 10.4 10*3/uL (ref 4.0–10.5)
nRBC: 0 % (ref 0.0–0.2)

## 2022-04-09 LAB — GLUCOSE, CAPILLARY: Glucose-Capillary: 160 mg/dL — ABNORMAL HIGH (ref 70–99)

## 2022-04-09 LAB — MAGNESIUM: Magnesium: 2.2 mg/dL (ref 1.7–2.4)

## 2022-04-09 MED ORDER — SODIUM CHLORIDE 0.9% FLUSH
10.0000 mL | INTRAVENOUS | Status: DC | PRN
  Administered 2022-04-15: 10 mL

## 2022-04-09 MED ORDER — KETOROLAC TROMETHAMINE 15 MG/ML IJ SOLN: 15.0000 mg | Freq: Three times a day (TID) | INTRAMUSCULAR | Status: DC

## 2022-04-09 MED ORDER — KETOROLAC TROMETHAMINE 15 MG/ML IJ SOLN
15.0000 mg | Freq: Three times a day (TID) | INTRAMUSCULAR | Status: DC
  Administered 2022-04-09 – 2022-04-11 (×6): 15 mg via INTRAVENOUS
  Filled 2022-04-09 (×6): qty 1

## 2022-04-09 MED ORDER — SODIUM CHLORIDE 0.9% FLUSH: 10.0000 mL | Freq: Two times a day (BID) | INTRAVENOUS | Status: DC

## 2022-04-09 MED ORDER — INSULIN ASPART 100 UNIT/ML IJ SOLN
0.0000 [IU] | Freq: Four times a day (QID) | INTRAMUSCULAR | Status: DC
  Administered 2022-04-09 – 2022-04-10 (×2): 2 [IU] via SUBCUTANEOUS
  Administered 2022-04-10: 1 [IU] via SUBCUTANEOUS
  Administered 2022-04-10: 2 [IU] via SUBCUTANEOUS
  Administered 2022-04-11: 1 [IU] via SUBCUTANEOUS
  Administered 2022-04-11 (×2): 2 [IU] via SUBCUTANEOUS
  Administered 2022-04-12 (×2): 1 [IU] via SUBCUTANEOUS
  Administered 2022-04-12: 2 [IU] via SUBCUTANEOUS
  Administered 2022-04-13 – 2022-04-14 (×7): 1 [IU] via SUBCUTANEOUS

## 2022-04-09 MED ORDER — TRAVASOL 10 % IV SOLN
INTRAVENOUS | Status: AC
  Filled 2022-04-09: qty 900

## 2022-04-09 NOTE — Progress Notes (Signed)
Peripherally Inserted Central Catheter Placement  The IV Nurse has discussed with the patient and/or persons authorized to consent for the patient, the purpose of this procedure and the potential benefits and risks involved with this procedure.  The benefits include less needle sticks, lab draws from the catheter, and the patient may be discharged home with the catheter. Risks include, but not limited to, infection, bleeding, blood clot (thrombus formation), and puncture of an artery; nerve damage and irregular heartbeat and possibility to perform a PICC exchange if needed/ordered by physician.  Alternatives to this procedure were also discussed.  Bard Power PICC patient education guide, fact sheet on infection prevention and patient information card has been provided to patient /or left at bedside.    PICC Placement Documentation  PICC Double Lumen 36/06/77 Left Basilic 41 cm 0 cm (Active)  Indication for Insertion or Continuance of Line Administration of hyperosmolar/irritating solutions (i.e. TPN, Vancomycin, etc.) 04/09/22 1914  Exposed Catheter (cm) 0 cm 04/09/22 1914  Site Assessment Clean, Dry, Intact 04/09/22 1914  Lumen #1 Status Flushed;Blood return noted;Saline locked 04/09/22 1914  Lumen #2 Status Flushed;Blood return noted;Saline locked 04/09/22 1914  Dressing Type Transparent 04/09/22 1914  Dressing Status Antimicrobial disc in place 04/09/22 1914  Dressing Intervention New dressing 04/09/22 1914  Dressing Change Due 04/16/22 04/09/22 1914    Unsuccessful attempt to Right Upper Arm   Scotty Court 04/09/2022, 7:16 PM

## 2022-04-09 NOTE — Progress Notes (Signed)
Provided NG tube care, as ordered.

## 2022-04-09 NOTE — Evaluation (Signed)
Occupational Therapy Evaluation Patient Details Name: Alexandria [name] MRN: [id number] DOB: May 30, 1945 Today's Date: 04/09/2022   History of Present Illness 77 y/o female presented with continued constipation and diffuse abdominal pain, worst in the LUQ. Now s/p robotic lower anterior rectosigmoid resection with colostomy and drainage of pelvic abscess 03/31/22. PHMx:hypothyroidism, grade I DD, HTN, IPF, diverticulosis, diverticulitis, DVT, chronic hypokalemia   Clinical Impression   This 77 yo female admitted and underwent above presents to acute OT with PLOF of being completely independent with basic ADLs, IADLs, driving, and walking 2.5 miles daily. She currently is at a setup/min guard A level for basic ADLs and will benefit from acute OT without need for follow up.      Recommendations for follow up therapy are one component of a multi-disciplinary discharge planning process, led by the attending physician.  Recommendations may be updated based on patient status, additional functional criteria and insurance authorization.   Follow Up Recommendations  No OT follow up     Assistance Recommended at Discharge PRN  Patient can return home with the following Assistance with cooking/housework    Functional Status Assessment  Patient has had a recent decline in their functional status and demonstrates the ability to make significant improvements in function in a reasonable and predictable amount of time.  Equipment Recommendations  None recommended by OT       Precautions / Restrictions Precautions Precautions: Other (comment) Precaution Comments: abdominal surgery, NGT Restrictions Weight Bearing Restrictions: No      Mobility Bed Mobility               General bed mobility comments: pt up in recliner upon my arrival    Transfers Overall transfer level: Needs assistance Equipment used: None Transfers: Sit to/from Stand Sit to Stand: Min guard                   Balance Overall balance assessment: Mild deficits observed, not formally tested (in standing--pt feels weak and shaky)                                         ADL either performed or assessed with clinical judgement   ADL Overall ADL's : Needs assistance/impaired Eating/Feeding: Independent;Sitting Eating/Feeding Details (indicate cue type and reason): NPO except ice chips Grooming: Set up;Sitting   Upper Body Bathing: Set up;Sitting   Lower Body Bathing: Min guard;Sit to/from stand   Upper Body Dressing : Set up;Sitting   Lower Body Dressing: Min guard;Sit to/from stand   Toilet Transfer: Magazine features editor Details (indicate cue type and reason): sit<>stand at [place] and Hygiene: Min guard;Sit to/from stand               Vision Patient Visual Report: No change from baseline              Pertinent Vitals/Pain Pain Assessment Pain Assessment: 0-10 Pain Score: 6  Pain Location: abdomen Pain Descriptors / Indicators: Sore, Grimacing Pain Intervention(s): Limited activity within patient's tolerance, Monitored during session     Hand Dominance  right   Extremity/Trunk Assessment Upper Extremity Assessment Upper Extremity Assessment: Overall WFL for tasks assessed      Communication Communication Communication: No difficulties   Cognition Arousal/Alertness: Awake/alert Behavior During Therapy: WFL for tasks assessed/performed Overall Cognitive Status: Within Functional Limits for tasks assessed  Home Living Family/patient expects to be discharged to:: Private residence Living Arrangements: Spouse/significant other Available Help at Discharge: Family;Available 24 hours/day Type of Home: House Home Access: Level entry     Home Layout: Two level Alternate Level Stairs-Number of Steps: flight   Bathroom Shower/Tub: Walk-in  shower;Door   [employer] Toilet: Handicapped height     Home Equipment: Cane - quad   Additional Comments: spouse has some signs of dementia per pt but he is still able to drive      Prior Functioning/Environment Prior Level of Function : Independent/Modified Independent             Mobility Comments: walks without AD, denies falls in past 6 months, walks 2.5 miles daily ADLs Comments: independent        OT Problem List: Impaired balance (sitting and/or standing);Decreased strength;Pain      OT Treatment/Interventions: Self-care/ADL training;DME and/or AE instruction;Patient/family education;Balance training    OT Goals(Current goals can be found in the care plan section) Acute Rehab OT Goals Patient Stated Goal: to feel stronger and not so shaky--hoping IV nurtrition they are starting today will help OT Goal Formulation: With patient Time For Goal Achievement: 04/23/22 Potential to Achieve Goals: Good  OT Frequency: Min 2X/week       AM-PAC OT "6 Clicks" Daily Activity     Outcome Measure Help from another person eating meals?: None Help from another person taking care of personal grooming?: A Little Help from another person toileting, which includes using toliet, bedpan, or urinal?: A Little Help from another person bathing (including washing, rinsing, drying)?: A Little Help from another person to put on and taking off regular upper body clothing?: A Little Help from another person to put on and taking off regular lower body clothing?: A Little 6 Click Score: 19   End of Session    Activity Tolerance: Patient tolerated treatment well Patient left: in chair;with call bell/phone within reach  OT Visit Diagnosis: Unsteadiness on feet (R26.81);Other abnormalities of gait and mobility (R26.89);Muscle weakness (generalized) (M62.81)                Time: 0626-9485 OT Time Calculation (min): 16 min Charges:  OT General Charges $OT Visit: 1 Visit OT Evaluation $OT  Eval Low Complexity: [place], OTR/L [facility] [phone] Office [phone]    Almon Register 04/09/2022, 1:57 PM

## 2022-04-09 NOTE — Progress Notes (Signed)
OT Cancellation Note  Patient Details Name: Alexandria [name] MRN: [id number] DOB: 02/20/1946   Cancelled Treatment:    Reason Eval/Treat Not Completed: Other (comment). Pt reports she is not getting up today, but then said not now. She had just gotten IV pain meds for nausea and pain. Will check back as schedule allows.  [place], OTR/L [facility] [phone] Office [phone]    Almon Register 04/09/2022, 8:18 AM

## 2022-04-09 NOTE — Progress Notes (Signed)
PROGRESS NOTE    Alexandria [name]  ESP:[id number] DOB: [birth year] DOA: 03/28/2022 PCP: [name], DO    Brief Narrative:  Alexandria [name] is a 77 y/o with a history of hypothyroidism, grade I DD, HTN, IPF, diverticulosis, diverticulitis, DVT, chronic hypokalemia who has a long history of constipation and has recently tried Linzess but stopped after 2 weeks due to lack of efficacy by her report. She recently got CT abd/pelvis 03/20/22 which revealed an enhancing lesion/mass just below the sigmoid-rectal junction. Flex Sig was performed 03/20/22 which revealed a polypoid mass at the sigmoid-rectal junction. Bx was readout by pathology as an inflammatory poly w/o dysplasia or malignancy. Patient had MRI abd/pelvis 03/26/22 -showed severe sigmoid diverticulitis.  Due to continued constipation and diffuse abdominal pain, worst in the LUQ, she presented to [facility] for evaluation. General surgery was consulted. Patient underwent repeat CT abd/pelvis and thought to have complicated diverticulitis with abscess. Unable to drain per IR.  Ultimately, she underwent rectosigmoid resection with colostomy by Dr. [name] 12/26.   With poor recovery postop and persistent ileus.     Assessment & Plan:   Complicated sigmoid diverticulitis with pelvic abscess: Persistent ileus. Rectosigmoid resection and colostomy Dr. [name] 12/26.  IV fluids.  On IV Zosyn.  Biopsy with evidence of diverticulitis with pericolonic abscess and perforation.  No malignancy. Started on TPN with PICC line and had NG tube inserted.  Accidentally came off. 1/4, with persistent ileus, NG tube reinserted.  Starting on TPN again today with PICC line. Trying IV Tylenol and Toradol and avoiding fentanyl. Continues to have rectal bleeding, intermittent likely from the stump.  Symptomatic anemia due to postop blood loss: Previous hemoglobin 12-13.  Hemoglobin dropped to 7.3-1 unit of PRBC-9.8-7.7-8.5-9.3-9.1.   Adequate.  Hospital-acquired delirium: Patient is developing impulsiveness and occasional confusion.  Likely related to hospitalization, medications and severe medical illness. Frequent reorientation. Stable today.  Chronic medical issues including Chronic diastolic congestive heart failure, diuresis on hold.  On maintenance IV fluids.  Euvolemic. Hypothyroidism, unreliable oral intake.  Continue IV Synthroid. Idiopathic pulmonary fibrosis: Without exacerbation. Hypokalemia: Replaced and adequate. Hypomagnesemia: Replaced. Hypophosphatemia: Replaced  Urinary retention: Secondary to pelvic pain and immobility.  Foley catheter removed 1/3, she was able to urinate overnight.  Will monitor closely.  Rectal bleeding and secretions: Anticipated from remaining rectal stump and inflammation.  Monitor.   DVT prophylaxis: enoxaparin (LOVENOX) injection 40 mg Start: 04/06/22 1000 Place and maintain sequential compression device Start: 03/30/22 0940   Code Status: Full code Family Communication: None at the bedside. Disposition Plan: Status is: Inpatient Remains inpatient appropriate because: Significant abdominal pain and distention, recovery from surgery.  Persistent ileus.     Consultants:  General surgery  Procedures:  Rectosigmoid resection and colostomy  Antimicrobials:  IV Zosyn 12/23---   Subjective:  Seen and examined.  She is more concerned about rectal bleeding that is some serosanguineous drainage when she changes posture.  Pain is controlled today.  Minimal output on the NG tube. Apparently she has about 100 mL of stool in the colostomy bag.  Objective: Vitals:   04/08/22 2045 04/08/22 2328 04/09/22 0204 04/09/22 0501  BP: (!) 152/63   (!) 147/61  Pulse: 71   64  Resp: '18  18 18  '$ Temp: 97.6 F (36.4 C) (!) 97.5 F (36.4 C) 98 F (36.7 C) 97.9 F (36.6 C)  TempSrc: Oral Oral Oral Oral  SpO2: 97%   97%  Weight:      Height:  Intake/Output Summary  (Last 24 hours) at 04/09/2022 1036 Last data filed at 04/09/2022 1000 Gross per 24 hour  Intake 1182.74 ml  Output 1300 ml  Net -117.26 ml   Filed Weights   03/31/22 1057 04/06/22 0550 04/08/22 0500  Weight: 63 kg 68.9 kg 70.6 kg    Examination:  General exam: Appears Anxious.  Alert oriented x 4.  In mild distress on exam.  On room air. Respiratory system: Clear to auscultation. Respiratory effort normal.  No added sounds. Cardiovascular system: S1 & S2 heard, RRR.  Gastrointestinal system: Abdomen is mildly tender.   Colostomy bag with 100 mL of loose stool..  Bowel sounds present. Incisions intact. Central nervous system: Alert and oriented x 3-4. More composed and interactive today. Extremities: Symmetric 5 x 5 power.    Data Reviewed: I have personally reviewed following labs and imaging studies  CBC: Recent Labs  Lab 04/04/22 0611 04/04/22 1431 04/05/22 1150 04/06/22 0207 04/07/22 1041 04/09/22 0450  WBC 6.6 7.8 12.0* 10.8* 11.2* 10.4  NEUTROABS 3.9  --   --   --   --   --   HGB 7.7* 8.5* 9.0* 8.5* 9.3* 9.1*  HCT 24.1* 26.5* 28.3* 27.0* 30.5* 28.6*  MCV 100.4* 99.6 100.0 101.1* 103.7* 99.7  PLT 265 341 309 298 339 876*   Basic Metabolic Panel: Recent Labs  Lab 04/04/22 0611 04/05/22 0208 04/06/22 0207 04/07/22 0325 04/09/22 0450  NA 136 136 134* 133* 135  K 3.6 3.5 3.5 4.2 3.8  CL 103 102 102 100 103  CO2 '26 28 27 26 26  '$ GLUCOSE 80 132* 149* 91 97  BUN 9 8 6* 8 6*  CREATININE 0.68 0.64 0.60 0.52 0.83  CALCIUM 7.4* 7.5* 7.5* 7.9* 8.0*  MG 1.6* 2.2 2.4 2.3 2.2  PHOS 2.0* 2.2* 2.7 2.7  --    GFR: Estimated Creatinine Clearance: 54.8 mL/min (by C-G formula based on SCr of 0.83 mg/dL). Liver Function Tests: Recent Labs  Lab 04/04/22 0611 04/05/22 0208 04/06/22 0207  AST '19 20 15  '$ ALT '11 13 11  '$ ALKPHOS 44 46 47  BILITOT 0.4 0.6 0.4  PROT 4.5* 5.2* 5.6*  ALBUMIN <1.5* 1.8* 2.0*   No results for input(s): "LIPASE", "AMYLASE" in the last 168  hours.  No results for input(s): "AMMONIA" in the last 168 hours. Coagulation Profile: No results for input(s): "INR", "PROTIME" in the last 168 hours.  Cardiac Enzymes: No results for input(s): "CKTOTAL", "CKMB", "CKMBINDEX", "TROPONINI" in the last 168 hours. BNP (last 3 results) No results for input(s): "PROBNP" in the last 8760 hours. HbA1C: No results for input(s): "HGBA1C" in the last 72 hours.  CBG: Recent Labs  Lab 04/06/22 2346 04/07/22 0523 04/07/22 1203 04/07/22 1726 04/07/22 2356  GLUCAP 125* 111* 149* 140* 91   Lipid Profile: No results for input(s): "CHOL", "HDL", "LDLCALC", "TRIG", "CHOLHDL", "LDLDIRECT" in the last 72 hours.  Thyroid Function Tests: No results for input(s): "TSH", "T4TOTAL", "FREET4", "T3FREE", "THYROIDAB" in the last 72 hours. Anemia Panel: No results for input(s): "VITAMINB12", "FOLATE", "FERRITIN", "TIBC", "IRON", "RETICCTPCT" in the last 72 hours. Sepsis Labs: No results for input(s): "PROCALCITON", "LATICACIDVEN" in the last 168 hours.  No results found for this or any previous visit (from the past 240 hour(s)).       Radiology Studies: [place] EKG SITE RITE  Result Date: 04/09/2022 If Site Rite image not attached, placement could not be confirmed due to current cardiac rhythm.  DG Abd 1 View  Result  Date: 04/08/2022 CLINICAL DATA:  Placement of NG tube EXAM: ABDOMEN - 1 VIEW COMPARISON:  Previous abdomen radiograph done on 04/08/2022, CT done on 04/06/2022 FINDINGS: There is interval placement of enteric tube with its tip in the region of body of stomach. There is dilation of small-bowel loops in mid abdomen. Lower abdomen and pelvis are not included in the image. There is blunting of right lateral CP angle. Increased interstitial markings are seen in right lower lung field and left parahilar region. IMPRESSION: Tip of enteric tube is seen in the stomach. Electronically Signed   By: Elmer Picker M.D.   On: 04/08/2022 15:33   DG  Abd Portable 1V  Result Date: 04/08/2022 CLINICAL DATA:  Nausea and abdominal pain EXAM: PORTABLE ABDOMEN - 1 VIEW COMPARISON:  CT 04/06/2022 FINDINGS: Multiple dilated air-filled loops of small bowel seen throughout the abdomen measuring up to 4.7 cm in diameter moderate.) Similar to the prior x-ray. There is air in nondilated loops of colon and stomach. No obvious free air on this portable supine radiograph. Curvature and degenerative changes of the spine. IMPRESSION: Moderate air-filled dilatation of the small bowel diffusely. Again possible ileus. Recommend continued surveillance Electronically Signed   By: Jill Side M.D.   On: 04/08/2022 13:23        Scheduled Meds:  Chlorhexidine Gluconate Cloth  6 each Topical Daily   enoxaparin (LOVENOX) injection  40 mg Subcutaneous Q24H   feeding supplement  1 Container Oral TID BM   insulin aspart  0-9 Units Subcutaneous Q6H   ketorolac  15-30 mg Intravenous Q8H   levothyroxine  84 mcg Intravenous Daily   lidocaine  1 patch Transdermal Q24H   nitroGLYCERIN  0.5 inch Topical Q6H   ondansetron  4 mg Oral TID AC & HS   simethicone  80 mg Oral QID   sodium chloride flush  10-40 mL Intracatheter Q12H   sodium chloride flush  3 mL Intravenous Q12H   Continuous Infusions:  sodium chloride     acetaminophen 1,000 mg (04/09/22 0640)   dextrose 5 % and 0.45% NaCl 10 mL/hr at 04/09/22 0642   famotidine (PEPCID) IV 20 mg (04/08/22 2332)   lactated ringers     ondansetron (ZOFRAN) IV     piperacillin-tazobactam (ZOSYN)  IV 3.375 g (04/09/22 0821)   promethazine (PHENERGAN) injection (IM or IVPB) 12.5 mg (04/03/22 0429)   TPN ADULT (ION)       LOS: 11 days    Time spent: 35 minutes    Barb Merino, MD [facility] Pager [phone]

## 2022-04-09 NOTE — Progress Notes (Signed)
PHARMACY - TOTAL PARENTERAL NUTRITION CONSULT NOTE   Indication: Prolonged ileus  Patient Measurements: Height: _0  (162.6 cm) Weight: 70.6 kg (155 lb 10.3 oz) IBW/kg (Calculated) : 54.7 TPN AdjBW (KG): 63 Body mass index is 26.72 kg/m. Usual Weight:   Assessment:  Pharmacy is consulted to initiate TPN in 77 yo female with prolonged ileus. Pt  is s/p robotic lower anterior rectosigmoid resection with colostomy and drainage of pelvic abscess by Dr. [name] 12/26.   Glucose / Insulin:  No history of DM, A1c 5.4 (03/18/20). Will resume SSI Electrolytes: WNL Renal: SCr < 1, BUN 8 Hepatic: LFT, alk phos WNL  - albumin low at 2.0 - TG 78 ( 04/05/22)  Intake / Output; MIVF: Net -487 mL - D5-0.45%NaCl @ 10 ml/hr - po : 180m - UOP 500 + some unmeasured - Colostomy OP 100 - NG: none recorded yet  GI Meds: IV Pepcid/12 hrs, Zofran 436mpo QID, Simethicone 804mID  GI Imaging: - 12/24: Current findings are consistent with complicated sigmoid diverticulitis. Distal sigmoid colon shows wall thickening and adjacent inflammation. - 1/1 CT: decreasing residual pelvic abscess, ileus, ostomy, distended gallbladder, small left pleural effusion w/ pulm fibrosis  GI Surgeries / Procedures:  - 12/26: robotic lower anterior rectosigmoid resection with colostomy and drainage of pelvic abscess   Central access:   12/30 PICC 1/2: PICC accidentally pulled out. 1/4: Replace PICC  TPN start date:  12/30> 1/2  Nutritional Goals: Goal TPN rate is 75 mL/hr (provides 90 g of protein and 1818 kcals per day)  RD Assessment: Estimated Needs Total Energy Estimated Needs: 1700-1900 kcals Total Protein Estimated Needs: 85-100 g Total Fluid Estimated Needs: >/= 1.7 L  Current Nutrition:  Clear liquids  Plan:  PICC to be replaced Resume TPN at 75 mL/hr at 1800, the goal rate  Electrolytes in TPN:  Na 100m60m  K 60mE29m Ca 5mEq/64mMg 4 mEq/L  Phos 20 mmol/L  Cl:Ac 1:1 Add standard MVI and  trace elements to TPN (d/c po MV)  Resume CBGs and SSI q 6hrs tonight with new bag. Con't D5-0.45%NaCl @ 10 ml/hr TPN lab panel Mon/Thurs and prn    Dovid Bartko S. RobertAlford HighlandmD, BCPS Clinical Staff Pharmacist [email or web address] 04/09/2022 7:33 AM

## 2022-04-09 NOTE — Care Management Important Message (Signed)
Important Message  Patient Details IM Letter given. Name: Alexandria [name] MRN: [id number] Date of Birth: Jul 27, 1945   Medicare Important Message Given:  Yes     Kerin Salen 04/09/2022, 11:22 AM

## 2022-04-09 NOTE — Evaluation (Signed)
Physical Therapy Evaluation Patient Details Name: Alexandria [name] MRN: [id number] DOB: 1945-10-22 Today's Date: 04/09/2022  History of Present Illness  77 y/o female presented with continued constipation and diffuse abdominal pain, worst in the LUQ. Now s/p robotic lower anterior rectosigmoid resection with colostomy and drainage of pelvic abscess 03/31/22. PHMx:hypothyroidism, grade I DD, HTN, IPF, diverticulosis, diverticulitis, DVT, chronic hypokalemia  Clinical Impression  Pt admitted with above diagnosis. Pt transferred from bed to recliner with min/guard assist. Instructed pt in log roll technique for bed mobility. Some wheezing noted during PT eval, pt stated this is not baseline for her. Encouraged pt to sit in recliner for several hours and to use incentive spirometer. Good progress expected.  Pt currently with functional limitations due to the deficits listed below (see PT Problem List). Pt will benefit from skilled PT to increase their independence and safety with mobility to allow discharge to the venue listed below.        Recommendations for follow up therapy are one component of a multi-disciplinary discharge planning process, led by the attending physician.  Recommendations may be updated based on patient status, additional functional criteria and insurance authorization.  Follow Up Recommendations Home health PT      Assistance Recommended at Discharge Intermittent Supervision/Assistance  Patient can return home with the following  A little help with bathing/dressing/bathroom;Assistance with cooking/housework;Assist for transportation;Help with stairs or ramp for entrance    Equipment Recommendations Rollator (4 wheels)  Recommendations for Other Services       Functional Status Assessment Patient has had a recent decline in their functional status and demonstrates the ability to make significant improvements in function in a reasonable and predictable amount of time.      Precautions / Restrictions Precautions Precautions: Other (comment) Precaution Comments: abdominal surgery Restrictions Weight Bearing Restrictions: No      Mobility  Bed Mobility Overal bed mobility: Needs Assistance Bed Mobility: Rolling, Sidelying to Sit Rolling: Supervision Sidelying to sit: Min assist       General bed mobility comments: VCs for log roll technique    Transfers Overall transfer level: Needs assistance   Transfers: Sit to/from Stand, Bed to chair/wheelchair/BSC Sit to Stand: Min guard   Step pivot transfers: Min guard       General transfer comment: steady, no loss of balance, assist to management of lines. Wheezing noted    Ambulation/Gait                  Stairs            Wheelchair Mobility    Modified Rankin (Stroke Patients Only)       Balance Overall balance assessment: Needs assistance   Sitting balance-Leahy Scale: Good       Standing balance-Leahy Scale: Fair                               Pertinent Vitals/Pain Pain Assessment Pain Assessment: 0-10 Pain Score: 7  Pain Location: abdomen Pain Descriptors / Indicators: Sore Pain Intervention(s): Limited activity within patient's tolerance, Monitored during session, Patient requesting pain meds-RN notified, Premedicated before session    Home Living Family/patient expects to be discharged to:: Private residence Living Arrangements: Spouse/significant other Available Help at Discharge: Family;Available 24 hours/day Type of Home: House Home Access: Level entry     Alternate Level Stairs-Number of Steps: flight Home Layout: Two level Home Equipment: Cane - quad Additional Comments: spouse has some  signs of dementia per pt but he is still able to drive    Prior Function Prior Level of Function : Independent/Modified Independent             Mobility Comments: walks without AD, denies falls in past 6 months, walks 2.5 miles  daily ADLs Comments: independent     Hand Dominance        Extremity/Trunk Assessment   Upper Extremity Assessment Upper Extremity Assessment: Defer to OT evaluation    Lower Extremity Assessment Lower Extremity Assessment: Overall WFL for tasks assessed    Cervical / Trunk Assessment Cervical / Trunk Assessment: Normal  Communication   Communication: No difficulties  Cognition Arousal/Alertness: Awake/alert Behavior During Therapy: WFL for tasks assessed/performed Overall Cognitive Status: Within Functional Limits for tasks assessed                                          General Comments      Exercises     Assessment/Plan    PT Assessment Patient needs continued PT services  PT Problem List Decreased mobility;Decreased balance;Decreased activity tolerance;Pain       PT Treatment Interventions Gait training;Therapeutic activities;Therapeutic exercise;Patient/family education    PT Goals (Current goals can be found in the Care Plan section)  Acute Rehab PT Goals Patient Stated Goal: return to walking 2.5 miles/day PT Goal Formulation: With patient Time For Goal Achievement: 04/23/22 Potential to Achieve Goals: Good    Frequency Min 3X/week     Co-evaluation               AM-PAC PT "6 Clicks" Mobility  Outcome Measure Help needed turning from your back to your side while in a flat bed without using bedrails?: A Little Help needed moving from lying on your back to sitting on the side of a flat bed without using bedrails?: A Little Help needed moving to and from a bed to a chair (including a wheelchair)?: A Little Help needed standing up from a chair using your arms (e.g., wheelchair or bedside chair)?: A Little Help needed to walk in hospital room?: A Little Help needed climbing 3-5 steps with a railing? : A Little 6 Click Score: 18    End of Session   Activity Tolerance: Patient limited by pain Patient left: in chair;with  call bell/phone within reach Nurse Communication: Patient requests pain meds;Mobility status PT Visit Diagnosis: Difficulty in walking, not elsewhere classified (R26.2);Pain    Time: 5916-3846 PT Time Calculation (min) (ACUTE ONLY): 17 min   Charges:   PT Evaluation $PT Eval Moderate Complexity: 1 Mod         Philomena Doheny PT 04/09/2022  Acute Rehabilitation Services  Office [phone]

## 2022-04-09 NOTE — Progress Notes (Signed)
PT Cancellation Note  Patient Details Name: Alexandria [name] MRN: [id number] DOB: 1945/06/06   Cancelled Treatment:    Reason Eval/Treat Not Completed: Medical issues which prohibited therapy (Pt having pain and nausea. Will check back later today.)   Philomena Doheny PT 04/09/2022  [place]  Office [phone]

## 2022-04-09 NOTE — Progress Notes (Signed)
Nutrition Follow-up  INTERVENTION:   Monitor magnesium, potassium, and phosphorus for at least 3 days, MD to replete as needed, as pt is at risk for refeeding syndrome.  -TPN management per Pharmacy  -Continue 100 mg Thiamine x 3 days (last dose 1/6)  -D/c Boost Breeze  -Daily weights while on TPN  NUTRITION DIAGNOSIS:   Inadequate oral intake related to altered GI function, acute illness as evidenced by NPO status.  Ongoing.  GOAL:   Patient will meet greater than or equal to 90% of their needs  Not meeting currently, starting TPN today.  MONITOR:   Diet advancement, Labs, Weight trends, Skin, I & O's (TPN tolerance)  ASSESSMENT:   77 yo female admitted with diverticulitis with pelvic abscess requiring surgery, developed post op ileus and requiring initiation of TPN. PMH includes CHF, HTN, pulmonary fibrosis  12/23: admitted 12/26: CLD 12/27: Soft diet 12/29: FLD ->NPO 12/30: TPN initiated 1/2: CLD, pt pulled PICC out and NGT 1/3: made NPO, NGT replaced  Patient resuming TPN, had to have NGT replaced 1/3 given persistent ileus per surgery.  TPN at 75 ml/hr, providing  Pt only received 2 doses of 100 mg Thiamine previously. Would recommend 3 more doses via TPN given refeeding risk as nutrition support was interrupted and PO remained poor.   Will d/c order for Boost Breeze while NGT in place.  Admission weight: 138 lbs Weight 1/3: 155 lbs  Medications: Reglan, Zofran, D5 infusion, Pepcid  Labs reviewed: CBGs: 91-149   Diet Order:   Diet Order             Diet NPO time specified Except for: Ice Chips  Diet effective now                   EDUCATION NEEDS:   Not appropriate for education at this time  Skin:  Skin Assessment: Skin Integrity Issues: Skin Integrity Issues:: Other (Comment) Incisions: abdominal, new colostomy Other: open abdomen  Last BM:  1/4 -colostomy  Height:   Ht Readings from Last 1 Encounters:  03/31/22 '5\' 4"'$  (1.626 m)     Weight:   Wt Readings from Last 1 Encounters:  04/08/22 70.6 kg    BMI:  Body mass index is 26.72 kg/m.  Estimated Nutritional Needs:   Kcal:  1700-1900 kcals  Protein:  85-100 g  Fluid:  >/= 1.7 L  [name], MS, RD, LDN Inpatient Clinical Dietitian Contact information available via [employer]

## 2022-04-09 NOTE — Progress Notes (Signed)
Progress Note  9 Days Post-Op  Subjective: NGT replaced yesterday for ileus, having more stool output from stoma. Patient unsure if she is having bloody drainage from rectum still. Urinary retention overnight but she reports spontaneously voiding this AM.   Objective: Vital signs in last 24 hours: Temp:  [97.5 F (36.4 C)-98.1 F (36.7 C)] 97.9 F (36.6 C) (01/04 0501) Pulse Rate:  [64-86] 64 (01/04 0501) Resp:  [18] 18 (01/04 0501) BP: (146-152)/(61-68) 147/61 (01/04 0501) SpO2:  [96 %-97 %] 97 % (01/04 0501) Last BM Date : 04/09/22  Intake/Output from previous day: 01/03 0701 - 01/04 0700 In: 968 [P.O.:120; I.V.:313.3; IV Piggyback:534.7] Out: 1050 [Urine:800; Stool:100] Intake/Output this shift: No intake/output data recorded.  PE: General: WD, chronically ill appearing female who is laying in bed in NAD Heart: regular, rate, and rhythm.  Lungs:  Respiratory effort nonlabored Abd: soft, appropriately ttp, ND, +BS, stoma viable, incisions C/D/I, NGT with thin drainage   Lab Results:  Recent Labs    04/07/22 1041 04/09/22 0450  WBC 11.2* 10.4  HGB 9.3* 9.1*  HCT 30.5* 28.6*  PLT 339 405*    BMET Recent Labs    04/07/22 0325 04/09/22 0450  NA 133* 135  K 4.2 3.8  CL 100 103  CO2 26 26  GLUCOSE 91 97  BUN 8 6*  CREATININE 0.52 0.83  CALCIUM 7.9* 8.0*    PT/INR No results for input(s): "LABPROT", "INR" in the last 72 hours. CMP     Component Value Date/Time   NA 135 04/09/2022 0450   NA 140 03/21/2021 1214   K 3.8 04/09/2022 0450   CL 103 04/09/2022 0450   CO2 26 04/09/2022 0450   GLUCOSE 97 04/09/2022 0450   BUN 6 (L) 04/09/2022 0450   BUN 21 03/21/2021 1214   CREATININE 0.83 04/09/2022 0450   CREATININE 0.74 03/13/2022 1511   CALCIUM 8.0 (L) 04/09/2022 0450   PROT 5.6 (L) 04/06/2022 0207   ALBUMIN 2.0 (L) 04/06/2022 0207   AST 15 04/06/2022 0207   ALT 11 04/06/2022 0207   ALKPHOS 47 04/06/2022 0207   BILITOT 0.4 04/06/2022 0207    GFRNONAA >60 04/09/2022 0450   GFRAA 85 05/24/2020 1006   Lipase     Component Value Date/Time   LIPASE 25 03/28/2022 1904       Studies/Results: DG Abd 1 View  Result Date: 04/08/2022 CLINICAL DATA:  Placement of NG tube EXAM: ABDOMEN - 1 VIEW COMPARISON:  Previous abdomen radiograph done on 04/08/2022, CT done on 04/06/2022 FINDINGS: There is interval placement of enteric tube with its tip in the region of body of stomach. There is dilation of small-bowel loops in mid abdomen. Lower abdomen and pelvis are not included in the image. There is blunting of right lateral CP angle. Increased interstitial markings are seen in right lower lung field and left parahilar region. IMPRESSION: Tip of enteric tube is seen in the stomach. Electronically Signed   By: Elmer Picker M.D.   On: 04/08/2022 15:33   DG Abd Portable 1V  Result Date: 04/08/2022 CLINICAL DATA:  Nausea and abdominal pain EXAM: PORTABLE ABDOMEN - 1 VIEW COMPARISON:  CT 04/06/2022 FINDINGS: Multiple dilated air-filled loops of small bowel seen throughout the abdomen measuring up to 4.7 cm in diameter moderate.) Similar to the prior x-ray. There is air in nondilated loops of colon and stomach. No obvious free air on this portable supine radiograph. Curvature and degenerative changes of the spine. IMPRESSION: Moderate air-filled  dilatation of the small bowel diffusely. Again possible ileus. Recommend continued surveillance Electronically Signed   By: Jill Side M.D.   On: 04/08/2022 13:23    Anti-infectives: Anti-infectives (From admission, onward)    Start     Dose/Rate Route Frequency Ordered Stop   04/05/22 1600  piperacillin-tazobactam (ZOSYN) IVPB 3.375 g        3.375 g 12.5 mL/hr over 240 Minutes Intravenous Every 8 hours 04/05/22 1222 04/10/22 1559   03/31/22 1045  cefoTEtan (CEFOTAN) 2 g in sodium chloride 0.9 % 100 mL IVPB        2 g 200 mL/hr over 30 Minutes Intravenous On call to O.R. 03/31/22 0959 03/31/22 1225    03/29/22 0800  piperacillin-tazobactam (ZOSYN) IVPB 3.375 g  Status:  Discontinued        3.375 g 12.5 mL/hr over 240 Minutes Intravenous Every 8 hours 03/29/22 0746 04/05/22 0826        Assessment/Plan POD#9 s/p robotic lower anterior rectosigmoid resection with colostomy and drainage of pelvic abscess by Dr. 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, probable diverticulitis  - pathology is diverticulitis no malignancy - CT AP1/1 with ileus, pelvic drain in place with decreased but residual pelvix abscess - NGT replaced, will need to resume TPN, PICC to be replaced today - Pt reports rectal bleeding - CBC stable, continue to monitor  - drain removed  - WOC following for new ostomy - overall doing well with it. She will need a little more teaching prior to dc   FEN: TPN to resume, NGT to LIWS ID: zosyn 12/31>1/5 VTE: hold LMWH   - below per TRH -  Chronic diastolic HF Hypothyroidism Pulmonary fibrosis HTN   LOS: 11 days    Norm Parcel, [facility] 04/09/2022, 9:25 AM Please see Amion for pager number during day hours 7:00am-4:30pm

## 2022-04-10 DIAGNOSIS — K5730 Diverticulosis of large intestine without perforation or abscess without bleeding: Secondary | ICD-10-CM | POA: Diagnosis not present

## 2022-04-10 LAB — GLUCOSE, CAPILLARY
Glucose-Capillary: 157 mg/dL — ABNORMAL HIGH (ref 70–99)
Glucose-Capillary: 160 mg/dL — ABNORMAL HIGH (ref 70–99)
Glucose-Capillary: 130 mg/dL — ABNORMAL HIGH (ref 70–99)
Glucose-Capillary: 120 mg/dL — ABNORMAL HIGH (ref 70–99)

## 2022-04-10 LAB — COMPREHENSIVE METABOLIC PANEL
ALT: 11 U/L (ref 0–44)
AST: 16 U/L (ref 15–41)
Albumin: 1.9 g/dL — ABNORMAL LOW (ref 3.5–5.0)
Alkaline Phosphatase: 53 U/L (ref 38–126)
Anion gap: 7 (ref 5–15)
BUN: 10 mg/dL (ref 8–23)
CO2: 27 mmol/L (ref 22–32)
Calcium: 7.6 mg/dL — ABNORMAL LOW (ref 8.9–10.3)
Chloride: 103 mmol/L (ref 98–111)
Creatinine, Ser: 0.77 mg/dL (ref 0.44–1.00)
GFR, Estimated: >60 mL/min (ref >60)
Glucose, Bld: 160 mg/dL — ABNORMAL HIGH (ref 70–99)
Potassium: 3.4 mmol/L — ABNORMAL LOW (ref 3.5–5.1)
Sodium: 137 mmol/L (ref 135–145)
Total Bilirubin: 0.3 mg/dL (ref 0.3–1.2)
Total Protein: 5.7 g/dL — ABNORMAL LOW (ref 6.5–8.1)

## 2022-04-10 LAB — CBC
HCT: 25.9 % — ABNORMAL LOW (ref 36.0–46.0)
Hemoglobin: 8.2 g/dL — ABNORMAL LOW (ref 12.0–15.0)
MCH: 31.7 pg (ref 26.0–34.0)
MCHC: 31.7 g/dL (ref 30.0–36.0)
MCV: 100 fL (ref 80.0–100.0)
Platelets: 422 10*3/uL — ABNORMAL HIGH (ref 150–400)
RBC: 2.59 MIL/uL — ABNORMAL LOW (ref 3.87–5.11)
RDW: 15.5 % (ref 11.5–15.5)
WBC: 11.9 10*3/uL — ABNORMAL HIGH (ref 4.0–10.5)
nRBC: 0 % (ref 0.0–0.2)

## 2022-04-10 LAB — MAGNESIUM: Magnesium: 2.3 mg/dL (ref 1.7–2.4)

## 2022-04-10 LAB — PHOSPHORUS: Phosphorus: 2.2 mg/dL — ABNORMAL LOW (ref 2.5–4.6)

## 2022-04-10 MED ORDER — ACETAMINOPHEN 10 MG/ML IV SOLN
1000.0000 mg | Freq: Four times a day (QID) | INTRAVENOUS | Status: AC
  Administered 2022-04-10 – 2022-04-11 (×4): 1000 mg via INTRAVENOUS
  Filled 2022-04-10 (×4): qty 100

## 2022-04-10 MED ORDER — SODIUM CHLORIDE 0.9 % IV SOLN
125.0000 mg | Freq: Once | INTRAVENOUS | Status: AC
  Administered 2022-04-10: 125 mg via INTRAVENOUS
  Filled 2022-04-10: qty 10

## 2022-04-10 MED ORDER — TRAVASOL 10 % IV SOLN
INTRAVENOUS | Status: AC
  Filled 2022-04-10: qty 900

## 2022-04-10 MED ORDER — SODIUM CHLORIDE 0.9 % IV SOLN: INTRAVENOUS | Status: DC

## 2022-04-10 MED ORDER — POTASSIUM PHOSPHATES 15 MMOLE/5ML IV SOLN
20.0000 mmol | Freq: Once | INTRAVENOUS | Status: AC
  Administered 2022-04-10: 20 mmol via INTRAVENOUS
  Filled 2022-04-10: qty 6.67

## 2022-04-10 NOTE — Progress Notes (Signed)
PHARMACY - TOTAL PARENTERAL NUTRITION CONSULT NOTE   Indication: Prolonged ileus  Patient Measurements: Height: '5\' 4"'$  (162.6 cm) Weight: 69.5 kg (153 lb 3.5 oz) IBW/kg (Calculated) : 54.7 TPN AdjBW (KG): 63 Body mass index is 26.3 kg/m. Usual Weight:   Assessment:  Pharmacy is consulted to dose TPN in 77 yo female with prolonged ileus. Pt  is s/p robotic lower anterior rectosigmoid resection with colostomy and drainage of pelvic abscess by Dr. [name] 12/26.   Glucose / Insulin:  No history of DM, A1c 5.4 (03/18/20).  - CBGs:  91-160 - sSSI:  used 4 units / 24 hrs Electrolytes: Low K 3.4 and Phos 2.2.  Others WNL including CorrCa 9.28 Renal: SCr < 1, BUN 10 Hepatic: LFT, alk phos, Tbili WNL  - albumin low at 1.9 - TG 78 ( 04/05/22)  Intake / Output; MIVF: Not completely recorded - 0.9%NaCl @ 10 ml/hr - PO intake : 126m - Output:  Urine x5 unmeasured, Colostomy 200 mL, NG 800 mL  GI Meds: IV Pepcid/12 hrs, Simethicone '80mg'$  QID, PRN Zofran, PRN Reglan, PRN Phenergan  GI Imaging: - 12/24: Current findings are consistent with complicated sigmoid diverticulitis. Distal sigmoid colon shows wall thickening and adjacent inflammation. - 1/1 CT: decreasing residual pelvic abscess, ileus, ostomy, distended gallbladder, small left pleural effusion w/ pulm fibrosis  GI Surgeries / Procedures:  - 12/26: robotic lower anterior rectosigmoid resection with colostomy and drainage of pelvic abscess   Central access:   1/4: Replaced PICC  TPN start date:  12/30-1/2, restarted 1/4   Nutritional Goals: Goal TPN rate is 75 mL/hr (provides 90 g of protein and 1818 kcals per day)  RD Assessment: Estimated Needs Total Energy Estimated Needs: 1700-1900 kcals Total Protein Estimated Needs: 85-100 g Total Fluid Estimated Needs: >/= 1.7 L  Current Nutrition:  NPO and TPN  Plan:  Now:  KPhos 20 mmol IV x1 dose  Continue TPN at 75 mL/hr at 1800, the goal rate  Electrolytes in TPN:  Na  1059m/L  K 6085mL  Ca 5mE62m  Mg 4 mEq/L  Phos 20 mmol/L  Cl:Ac 1:1 Add standard MVI and trace elements to TPN Resume CBGs and SSI q 6hrs tonight with new bag. mIVF: 0.9% NaCl @ 10 ml/hr TPN lab panel Mon/Thurs and prn    Crystal S. RobeAlford HighlandarmD, BCPS Clinical Staff Pharmacist [email or web address] 04/10/2022 7:07 AM

## 2022-04-10 NOTE — Consult Note (Signed)
[facility] Nurse ostomy follow up; low anterior resection with colostomy, now has Ileus with NG tube in place  Stoma type/location: Stoma red and viable, above skin level, 1 1/2"  Peristomal assessment: erythema circumferentially, skin intact Treatment options for stomal/peristomal skin: crusted with stoma powder, applied 2" barrier ring  Output:  50 mls dark green stool, pudding consistency  Ostomy pouching: 1pc convex w/barrier ring 4 sets of  supplies in room, use supplies: barrier ring Kellie Simmering # [id number], and convex Roselee Culver # [id number]. Educational materials left at the bedside.  Education provided: patient feels very poorly today, says "Im never getting out of here" and defers to even empty ostomy pouch.  We discussed again the importance of emptying pouch when 1/3 to 1/2 full, she can verbalize how to perform lock and roll mechanism, how to clean ostomy spout.  Verbalizes understanding of using pattern to cut new ostomy pouch, apply barrier ring to back of new pouch and attach to clean dry skin.  Also educated on crusting with ostomy powder as she has some erythema to peristomal skin at this time.  Enrolled patient in [place] Start Discharge program: Yes previously    [place] team will continue to follow for further care of ostomy and education.     Thanks [employer] MSN, RN-BC, [employer]

## 2022-04-10 NOTE — Progress Notes (Signed)
PROGRESS NOTE    Alexandria [name]  IFO:[id number] DOB: Sep 02, 1945 DOA: 03/28/2022 PCP: [name], DO    Brief Narrative:  Alexandria [name] is a 77 y/o with a history of hypothyroidism, grade I DD, HTN, IPF, diverticulosis, diverticulitis, DVT, chronic hypokalemia who has a long history of constipation and has recently tried Linzess but stopped after 2 weeks due to lack of efficacy by her report. She recently got CT abd/pelvis 03/20/22 which revealed an enhancing lesion/mass just below the sigmoid-rectal junction. Flex Sig was performed 03/20/22 which revealed a polypoid mass at the sigmoid-rectal junction. Bx was readout by pathology as an inflammatory poly w/o dysplasia or malignancy. Patient had MRI abd/pelvis 03/26/22 -showed severe sigmoid diverticulitis.  Due to continued constipation and diffuse abdominal pain, worst in the LUQ, she presented to [facility] for evaluation. General surgery was consulted. Patient underwent repeat CT abd/pelvis and thought to have complicated diverticulitis with abscess. Unable to drain per IR.  Ultimately, she underwent rectosigmoid resection with colostomy by Dr. [name] 12/26.   With poor recovery postop and persistent ileus.     Assessment & Plan:   Complicated sigmoid diverticulitis with pelvic abscess: Persistent ileus. Rectosigmoid resection and colostomy Dr. [name] 12/26.  IV fluids.  On IV Zosyn.  Biopsy with evidence of diverticulitis with pericolonic abscess and perforation.  No malignancy. Started on TPN with PICC line and had NG tube inserted.  Accidentally came off. 1/4, with persistent ileus, NG tube reinserted.  Back on TPN.  Trying IV Tylenol and Toradol and avoiding fentanyl. Continues to have rectal bleeding, intermittent likely from the stump. Mobilize in the hallway.  Symptomatic anemia due to postop blood loss: Previous hemoglobin 12-13.  Hemoglobin dropped to 7.3-1 unit of PRBC-9.8-7.7-8.5-9.3-9.1.   Adequate.  Hospital-acquired delirium: Patient is developing impulsiveness and occasional confusion.  Likely related to hospitalization, medications and severe medical illness. Frequent reorientation. Stable today.  Chronic medical issues including Chronic diastolic congestive heart failure, diuresis on hold.  On maintenance IV fluids.  Euvolemic. Hypothyroidism, unreliable oral intake.  Continue IV Synthroid. Idiopathic pulmonary fibrosis: Without exacerbation. Hypokalemia: Replaced with TPN protocol. Hypomagnesemia: Replaced. Hypophosphatemia: Replaced  Urinary retention: Secondary to pelvic pain and immobility.  Foley catheter removed 1/3, she was able to urinate overnight.  Will monitor closely.  Rectal bleeding and secretions: Anticipated from remaining rectal stump and inflammation.  Monitor.   DVT prophylaxis: enoxaparin (LOVENOX) injection 40 mg Start: 04/06/22 1000 Place and maintain sequential compression device Start: 03/30/22 0940   Code Status: Full code Family Communication: None at the bedside. Disposition Plan: Status is: Inpatient Remains inpatient appropriate because: Significant abdominal pain and distention, recovery from surgery.  Persistent ileus.     Consultants:  General surgery  Procedures:  Rectosigmoid resection and colostomy  Antimicrobials:  IV Zosyn 12/23---   Subjective:  Patient seen and examined.  Anxious.  She wants to go home.  NG tube with 800 mL output.  She does have some stool in her colostomy.  Complains of ongoing pain.  Still has bleeding through the rectum and she gets very anxious about it.  I told her that her hemoglobin is stable.  Objective: Vitals:   04/09/22 1321 04/09/22 2022 04/10/22 0500 04/10/22 0528  BP: (!) 135/95 (!) 142/64  (!) 151/68  Pulse: 66 72  75  Resp: '18 16  16  '$ Temp: 97.8 F (36.6 C) 98.1 F (36.7 C)  98.2 F (36.8 C)  TempSrc: Oral Oral  Oral  SpO2: 99% 97%  97%  Weight:   69.5 kg   Height:         Intake/Output Summary (Last 24 hours) at 04/10/2022 0944 Last data filed at 04/10/2022 0913 Gross per 24 hour  Intake 1855.85 ml  Output 1400 ml  Net 455.85 ml    Filed Weights   04/06/22 0550 04/08/22 0500 04/10/22 0500  Weight: 68.9 kg 70.6 kg 69.5 kg    Examination:  General exam: Anxious.  Mild distress with pain.  On room air.  Alert awake x 4.  Without any impulsiveness or confusion today. Respiratory system: Clear to auscultation. Respiratory effort normal.  No added sounds. Cardiovascular system: S1 & S2 heard, RRR.  Gastrointestinal system: Abdomen is mildly tender.   Colostomy bag with 100 mL of loose stool..  Bowel sounds present. Incisions intact. Central nervous system: Alert and oriented.  Mentation is clear. Extremities: Symmetric 5 x 5 power.    Data Reviewed: I have personally reviewed following labs and imaging studies  CBC: Recent Labs  Lab 04/04/22 0611 04/04/22 1431 04/05/22 1150 04/06/22 0207 04/07/22 1041 04/09/22 0450  WBC 6.6 7.8 12.0* 10.8* 11.2* 10.4  NEUTROABS 3.9  --   --   --   --   --   HGB 7.7* 8.5* 9.0* 8.5* 9.3* 9.1*  HCT 24.1* 26.5* 28.3* 27.0* 30.5* 28.6*  MCV 100.4* 99.6 100.0 101.1* 103.7* 99.7  PLT 265 341 309 298 339 405*    Basic Metabolic Panel: Recent Labs  Lab 04/04/22 0611 04/05/22 0208 04/06/22 0207 04/07/22 0325 04/09/22 0450 04/10/22 0221  NA 136 136 134* 133* 135 137  K 3.6 3.5 3.5 4.2 3.8 3.4*  CL 103 102 102 100 103 103  CO2 '26 28 27 26 26 27  '$ GLUCOSE 80 132* 149* 91 97 160*  BUN 9 8 6* 8 6* 10  CREATININE 0.68 0.64 0.60 0.52 0.83 0.77  CALCIUM 7.4* 7.5* 7.5* 7.9* 8.0* 7.6*  MG 1.6* 2.2 2.4 2.3 2.2 2.3  PHOS 2.0* 2.2* 2.7 2.7  --  2.2*    GFR: Estimated Creatinine Clearance: 56.3 mL/min (by C-G formula based on SCr of 0.77 mg/dL). Liver Function Tests: Recent Labs  Lab 04/04/22 0611 04/05/22 0208 04/06/22 0207 04/10/22 0221  AST '19 20 15 16  '$ ALT '11 13 11 11  '$ ALKPHOS 44 46 47 53  BILITOT  0.4 0.6 0.4 0.3  PROT 4.5* 5.2* 5.6* 5.7*  ALBUMIN <1.5* 1.8* 2.0* 1.9*    No results for input(s): "LIPASE", "AMYLASE" in the last 168 hours.  No results for input(s): "AMMONIA" in the last 168 hours. Coagulation Profile: No results for input(s): "INR", "PROTIME" in the last 168 hours.  Cardiac Enzymes: No results for input(s): "CKTOTAL", "CKMB", "CKMBINDEX", "TROPONINI" in the last 168 hours. BNP (last 3 results) No results for input(s): "PROBNP" in the last 8760 hours. HbA1C: No results for input(s): "HGBA1C" in the last 72 hours.  CBG: Recent Labs  Lab 04/07/22 1203 04/07/22 1726 04/07/22 2356 04/09/22 2335 04/10/22 0526  GLUCAP 149* 140* 91 160* 157*    Lipid Profile: No results for input(s): "CHOL", "HDL", "LDLCALC", "TRIG", "CHOLHDL", "LDLDIRECT" in the last 72 hours.  Thyroid Function Tests: No results for input(s): "TSH", "T4TOTAL", "FREET4", "T3FREE", "THYROIDAB" in the last 72 hours. Anemia Panel: No results for input(s): "VITAMINB12", "FOLATE", "FERRITIN", "TIBC", "IRON", "RETICCTPCT" in the last 72 hours. Sepsis Labs: No results for input(s): "PROCALCITON", "LATICACIDVEN" in the last 168 hours.  No results found for this or any previous visit (  from the past 240 hour(s)).       Radiology Studies: [place] EKG SITE RITE  Result Date: 04/09/2022 If Site Rite image not attached, placement could not be confirmed due to current cardiac rhythm.  DG Abd 1 View  Result Date: 04/08/2022 CLINICAL DATA:  Placement of NG tube EXAM: ABDOMEN - 1 VIEW COMPARISON:  Previous abdomen radiograph done on 04/08/2022, CT done on 04/06/2022 FINDINGS: There is interval placement of enteric tube with its tip in the region of body of stomach. There is dilation of small-bowel loops in mid abdomen. Lower abdomen and pelvis are not included in the image. There is blunting of right lateral CP angle. Increased interstitial markings are seen in right lower lung field and left parahilar  region. IMPRESSION: Tip of enteric tube is seen in the stomach. Electronically Signed   By: Elmer Picker M.D.   On: 04/08/2022 15:33   DG Abd Portable 1V  Result Date: 04/08/2022 CLINICAL DATA:  Nausea and abdominal pain EXAM: PORTABLE ABDOMEN - 1 VIEW COMPARISON:  CT 04/06/2022 FINDINGS: Multiple dilated air-filled loops of small bowel seen throughout the abdomen measuring up to 4.7 cm in diameter moderate.) Similar to the prior x-ray. There is air in nondilated loops of colon and stomach. No obvious free air on this portable supine radiograph. Curvature and degenerative changes of the spine. IMPRESSION: Moderate air-filled dilatation of the small bowel diffusely. Again possible ileus. Recommend continued surveillance Electronically Signed   By: Jill Side M.D.   On: 04/08/2022 13:23        Scheduled Meds:  Chlorhexidine Gluconate Cloth  6 each Topical Daily   enoxaparin (LOVENOX) injection  40 mg Subcutaneous Q24H   insulin aspart  0-9 Units Subcutaneous Q6H   ketorolac  15 mg Intravenous Q8H   levothyroxine  84 mcg Intravenous Daily   lidocaine  1 patch Transdermal Q24H   ondansetron  4 mg Oral TID AC & HS   simethicone  80 mg Oral QID   sodium chloride flush  10-40 mL Intracatheter Q12H   sodium chloride flush  10-40 mL Intracatheter Q12H   sodium chloride flush  3 mL Intravenous Q12H   Continuous Infusions:  sodium chloride     sodium chloride 10 mL/hr at 04/10/22 0754   acetaminophen 1,000 mg (04/10/22 0753)   famotidine (PEPCID) IV 20 mg (04/09/22 2147)   lactated ringers     ondansetron (ZOFRAN) IV     piperacillin-tazobactam (ZOSYN)  IV 3.375 g (04/10/22 0819)   potassium PHOSPHATE IVPB (in mmol) 20 mmol (04/10/22 0826)   promethazine (PHENERGAN) injection (IM or IVPB) 12.5 mg (04/03/22 0429)   TPN ADULT (ION) 75 mL/hr at 04/09/22 1921   TPN ADULT (ION)       LOS: 12 days    Time spent: 35 minutes    Barb Merino, MD [facility] Pager  [phone]

## 2022-04-10 NOTE — Progress Notes (Signed)
Progress Note  10 Days Post-Op  Subjective: NGT with minimal drainage but patient still complaining of some nausea. Having some ostomy output. Still reporting rectal bleeding.   Objective: Vital signs in last 24 hours: Temp:  [97.8 F (36.6 C)-98.2 F (36.8 C)] 98.2 F (36.8 C) (01/05 0528) Pulse Rate:  [66-75] 75 (01/05 0528) Resp:  [16-18] 16 (01/05 0528) BP: (135-151)/(64-95) 151/68 (01/05 0528) SpO2:  [97 %-99 %] 97 % (01/05 0528) Weight:  [69.5 kg] 69.5 kg (01/05 0500) Last BM Date : 04/09/22  Intake/Output from previous day: 01/04 0701 - 01/05 0700 In: 1855.9 [P.O.:150; I.V.:1247.3; IV Piggyback:458.6] Out: 1400 [Urine:400; Emesis/NG output:800; Stool:200] Intake/Output this shift: Total I/O In: -  Out: 5 [Stool:50]  PE: General: WD, chronically ill appearing female who is laying in bed in NAD Heart: regular, rate, and rhythm.  Lungs:  Respiratory effort nonlabored Abd: soft, appropriately ttp, ND, +BS, stoma viable, incisions C/D/I, NGT with thin drainage   Lab Results:  Recent Labs    04/07/22 1041 04/09/22 0450  WBC 11.2* 10.4  HGB 9.3* 9.1*  HCT 30.5* 28.6*  PLT 339 405*    BMET Recent Labs    04/09/22 0450 04/10/22 0221  NA 135 137  K 3.8 3.4*  CL 103 103  CO2 26 27  GLUCOSE 97 160*  BUN 6* 10  CREATININE 0.83 0.77  CALCIUM 8.0* 7.6*    PT/INR No results for input(s): "LABPROT", "INR" in the last 72 hours. CMP     Component Value Date/Time   NA 137 04/10/2022 0221   NA 140 03/21/2021 1214   K 3.4 (L) 04/10/2022 0221   CL 103 04/10/2022 0221   CO2 27 04/10/2022 0221   GLUCOSE 160 (H) 04/10/2022 0221   BUN 10 04/10/2022 0221   BUN 21 03/21/2021 1214   CREATININE 0.77 04/10/2022 0221   CREATININE 0.74 03/13/2022 1511   CALCIUM 7.6 (L) 04/10/2022 0221   PROT 5.7 (L) 04/10/2022 0221   ALBUMIN 1.9 (L) 04/10/2022 0221   AST 16 04/10/2022 0221   ALT 11 04/10/2022 0221   ALKPHOS 53 04/10/2022 0221   BILITOT 0.3 04/10/2022 0221    GFRNONAA >60 04/10/2022 0221   GFRAA 85 05/24/2020 1006   Lipase     Component Value Date/Time   LIPASE 25 03/28/2022 1904       Studies/Results: [place] EKG SITE RITE  Result Date: 04/09/2022 If Site Rite image not attached, placement could not be confirmed due to current cardiac rhythm.  DG Abd 1 View  Result Date: 04/08/2022 CLINICAL DATA:  Placement of NG tube EXAM: ABDOMEN - 1 VIEW COMPARISON:  Previous abdomen radiograph done on 04/08/2022, CT done on 04/06/2022 FINDINGS: There is interval placement of enteric tube with its tip in the region of body of stomach. There is dilation of small-bowel loops in mid abdomen. Lower abdomen and pelvis are not included in the image. There is blunting of right lateral CP angle. Increased interstitial markings are seen in right lower lung field and left parahilar region. IMPRESSION: Tip of enteric tube is seen in the stomach. Electronically Signed   By: Elmer Picker M.D.   On: 04/08/2022 15:33   DG Abd Portable 1V  Result Date: 04/08/2022 CLINICAL DATA:  Nausea and abdominal pain EXAM: PORTABLE ABDOMEN - 1 VIEW COMPARISON:  CT 04/06/2022 FINDINGS: Multiple dilated air-filled loops of small bowel seen throughout the abdomen measuring up to 4.7 cm in diameter moderate.) Similar to the prior x-ray. There is  air in nondilated loops of colon and stomach. No obvious free air on this portable supine radiograph. Curvature and degenerative changes of the spine. IMPRESSION: Moderate air-filled dilatation of the small bowel diffusely. Again possible ileus. Recommend continued surveillance Electronically Signed   By: Jill Side M.D.   On: 04/08/2022 13:23    Anti-infectives: Anti-infectives (From admission, onward)    Start     Dose/Rate Route Frequency Ordered Stop   04/05/22 1600  piperacillin-tazobactam (ZOSYN) IVPB 3.375 g        3.375 g 12.5 mL/hr over 240 Minutes Intravenous Every 8 hours 04/05/22 1222 04/10/22 1559   03/31/22 1045  cefoTEtan  (CEFOTAN) 2 g in sodium chloride 0.9 % 100 mL IVPB        2 g 200 mL/hr over 30 Minutes Intravenous On call to O.R. 03/31/22 0959 03/31/22 1225   03/29/22 0800  piperacillin-tazobactam (ZOSYN) IVPB 3.375 g  Status:  Discontinued        3.375 g 12.5 mL/hr over 240 Minutes Intravenous Every 8 hours 03/29/22 0746 04/05/22 0826        Assessment/Plan POD#10 s/p robotic lower anterior rectosigmoid resection with colostomy and drainage of pelvic abscess by Dr. 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, probable diverticulitis  - pathology is diverticulitis no malignancy - CT AP1/1 with ileus, pelvic drain in place with decreased but residual pelvix abscess - NGT replaced, continue TPN - Pt reports rectal bleeding - CBC stable, continue to monitor  - drain removed  - WOC following for new ostomy - overall doing well with it. She will need a little more teaching prior to dc - repeat CT 1/7 if not opening up   FEN: TPN, NGT to LIWS ID: zosyn 12/31>1/5 VTE: hold LMWH   - below per TRH -  Chronic diastolic HF Hypothyroidism Pulmonary fibrosis HTN   LOS: 12 days    Norm Parcel, [facility] 04/10/2022, 10:40 AM Please see Amion for pager number during day hours 7:00am-4:30pm

## 2022-04-11 ENCOUNTER — Inpatient Hospital Stay (HOSPITAL_COMMUNITY): Payer: Medicare Other

## 2022-04-11 DIAGNOSIS — K5730 Diverticulosis of large intestine without perforation or abscess without bleeding: Secondary | ICD-10-CM | POA: Diagnosis not present

## 2022-04-11 LAB — HEMOGLOBIN AND HEMATOCRIT, BLOOD
HCT: 25.5 % — ABNORMAL LOW (ref 36.0–46.0)
Hemoglobin: 8.2 g/dL — ABNORMAL LOW (ref 12.0–15.0)

## 2022-04-11 LAB — BASIC METABOLIC PANEL
Anion gap: 5 (ref 5–15)
BUN: 10 mg/dL (ref 8–23)
CO2: 27 mmol/L (ref 22–32)
Calcium: 7.5 mg/dL — ABNORMAL LOW (ref 8.9–10.3)
Chloride: 106 mmol/L (ref 98–111)
Creatinine, Ser: 0.57 mg/dL (ref 0.44–1.00)
GFR, Estimated: >60 mL/min (ref >60)
Glucose, Bld: 125 mg/dL — ABNORMAL HIGH (ref 70–99)
Potassium: 3.6 mmol/L (ref 3.5–5.1)
Sodium: 138 mmol/L (ref 135–145)

## 2022-04-11 LAB — GLUCOSE, CAPILLARY
Glucose-Capillary: 140 mg/dL — ABNORMAL HIGH (ref 70–99)
Glucose-Capillary: 147 mg/dL — ABNORMAL HIGH (ref 70–99)
Glucose-Capillary: 156 mg/dL — ABNORMAL HIGH (ref 70–99)
Glucose-Capillary: 162 mg/dL — ABNORMAL HIGH (ref 70–99)

## 2022-04-11 LAB — CBC
HCT: 22 % — ABNORMAL LOW (ref 36.0–46.0)
Hemoglobin: 6.8 g/dL — CL (ref 12.0–15.0)
MCH: 31.6 pg (ref 26.0–34.0)
MCHC: 30.9 g/dL (ref 30.0–36.0)
MCV: 102.3 fL — ABNORMAL HIGH (ref 80.0–100.0)
Platelets: 411 10*3/uL — ABNORMAL HIGH (ref 150–400)
RBC: 2.15 MIL/uL — ABNORMAL LOW (ref 3.87–5.11)
RDW: 15.6 % — ABNORMAL HIGH (ref 11.5–15.5)
WBC: 11.7 10*3/uL — ABNORMAL HIGH (ref 4.0–10.5)
nRBC: 0 % (ref 0.0–0.2)

## 2022-04-11 LAB — PREPARE RBC (CROSSMATCH)

## 2022-04-11 LAB — PHOSPHORUS: Phosphorus: 2.4 mg/dL — ABNORMAL LOW (ref 2.5–4.6)

## 2022-04-11 MED ORDER — SODIUM CHLORIDE 0.9% IV SOLUTION: Freq: Once | INTRAVENOUS | Status: AC

## 2022-04-11 MED ORDER — PIPERACILLIN-TAZOBACTAM 3.375 G IVPB
3.3750 g | Freq: Three times a day (TID) | INTRAVENOUS | Status: DC
  Administered 2022-04-11 – 2022-04-15 (×12): 3.375 g via INTRAVENOUS
  Filled 2022-04-11 (×12): qty 50

## 2022-04-11 MED ORDER — LORAZEPAM 2 MG/ML IJ SOLN
0.5000 mg | INTRAMUSCULAR | Status: DC | PRN
  Administered 2022-04-11 – 2022-04-13 (×5): 0.5 mg via INTRAVENOUS
  Filled 2022-04-11 (×5): qty 1

## 2022-04-11 MED ORDER — POTASSIUM PHOSPHATES 15 MMOLE/5ML IV SOLN
30.0000 mmol | Freq: Once | INTRAVENOUS | Status: AC
  Administered 2022-04-11: 30 mmol via INTRAVENOUS
  Filled 2022-04-11: qty 10

## 2022-04-11 MED ORDER — FLUOXETINE HCL 20 MG/5ML PO SOLN
20.0000 mg | Freq: Every day | ORAL | Status: DC
  Filled 2022-04-11 (×3): qty 5

## 2022-04-11 MED ORDER — TRAVASOL 10 % IV SOLN
INTRAVENOUS | Status: AC
  Filled 2022-04-11: qty 900

## 2022-04-11 NOTE — Progress Notes (Signed)
Pt has made several comments this AM stating "I am never going home. I am going to die here." Pt reassured and reminded she has been through a major surgery. Assurance offered and space allowed for pt to express feelings. Due to the frequency of these comments made, both Marlou Starks, MD and Sloan Leiter, MD made aware. Blood administration going at this time, so no available site for PRN ativan to be given.

## 2022-04-11 NOTE — Progress Notes (Signed)
PROGRESS NOTE    Alexandria [name]  STM:[id number] DOB: 1945-06-04 DOA: 03/28/2022 PCP: [name], DO    Brief Narrative:  77 y/o with a history of hypothyroidism, grade I DD, HTN, IPF, diverticulosis, diverticulitis, DVT, chronic hypokalemia who has a long history of constipation and has recently tried Linzess but stopped after 2 weeks due to lack of efficacy by her report. She recently got CT abd/pelvis 03/20/22 which revealed an enhancing lesion/mass just below the sigmoid-rectal junction. Flex Sig was performed 03/20/22 which revealed a polypoid mass at the sigmoid-rectal junction. Bx was readout by pathology as an inflammatory poly w/o dysplasia or malignancy. Patient had MRI abd/pelvis 03/26/22 -showed severe sigmoid diverticulitis.  Due to continued constipation and diffuse abdominal pain, worst in the LUQ, she presented to [facility] for evaluation. General surgery was consulted. Patient underwent repeat CT abd/pelvis and thought to have complicated diverticulitis with abscess. Unable to drain per IR.  Ultimately, she underwent rectosigmoid resection with colostomy by Dr. [name] 12/26.   With poor recovery postop and persistent ileus. 1/6, recurrence of fever and worsening abdominal pain.  Bleeding from the rectum.     Assessment & Plan:   Complicated sigmoid diverticulitis with pelvic abscess: Persistent ileus. Rectosigmoid resection and colostomy Dr. [name] 12/26.   Biopsy with evidence of diverticulitis with pericolonic abscess and perforation.  No malignancy. Started on TPN with PICC line and had NG tube inserted.  Accidentally came off. 1/4, with persistent ileus, NG tube reinserted.  Back on TPN.  Trying IV Tylenol and Toradol and fentanyl for pain relief. Continues to have rectal bleeding, intermittent likely from the stump. 1/6, persistent pain.  Recurrent low-grade fever. Repeat blood cultures, continue IV Zosyn.  CT scan abdomen pelvis.  Surgery  following.  Symptomatic anemia due to postop blood loss: Previous hemoglobin 12-13.  Hemoglobin dropped to 7.3-1 unit of PRBC-9.8-7.7-8.5-9.3-9.1-6.8. 1 unit of PRBC today. Discontinue Lovenox and NSAIDs.  Hospital-acquired delirium: Patient developed impulsiveness and occasional confusion.  Likely related to hospitalization, medications and severe medical illness. Frequent reorientation. Mental status is clear today however patient is very depressed and anxious. Currently on Ativan through IV.  Will start patient on low-dose fluoxetine clamping the NG tube.  Chronic medical issues including Chronic diastolic congestive heart failure, diuresis on hold.  On maintenance IV fluids and TPN.  Euvolemic. Hypothyroidism, unreliable oral intake.  Continue IV Synthroid. Idiopathic pulmonary fibrosis: Without exacerbation. Electrolytes replaced through TPN protocol.  Urinary retention: Secondary to pelvic pain and immobility.  Foley catheter removed 1/3, she was able to urinate overnight.  Will monitor closely.  Rectal bleeding and secretions: Anticipating conservative management.   DVT prophylaxis: Place and maintain sequential compression device Start: 03/30/22 0940   Code Status: Full code Family Communication: None at the bedside. Disposition Plan: Status is: Inpatient Remains inpatient appropriate because: Significant abdominal pain and distention, recovery from surgery.  Persistent ileus.     Consultants:  General surgery  Procedures:  Rectosigmoid resection and colostomy  Antimicrobials:  IV Zosyn 12/23----   Subjective:  Patient seen and examined.  Anxious.  She is asking me whether she will make it out of the hospital.  Continues to have abdominal pain and discomfort about 12 out of 10.  Nauseated but no vomiting.  Depressed and frustrated. She is more worried about her rectal bleeding.  Objective: Vitals:   04/11/22 0440 04/11/22 0500 04/11/22 0832 04/11/22 0853   BP: (!) 127/52  (!) 136/59 103/82  Pulse: 87  96 96  Resp: '16  15 16  '$ Temp: 97.9 F (36.6 C)  98.5 F (36.9 C) (!) 100.4 F (38 C)  TempSrc: Oral  Oral Oral  SpO2: 98%  97% 96%  Weight:  69 kg    Height:        Intake/Output Summary (Last 24 hours) at 04/11/2022 1143 Last data filed at 04/11/2022 1000 Gross per 24 hour  Intake 3644.55 ml  Output 1002 ml  Net 2642.55 ml   Filed Weights   04/08/22 0500 04/10/22 0500 04/11/22 0500  Weight: 70.6 kg 69.5 kg 69 kg    Examination:  General exam: Anxious.  Episodic pain and distressed.  On room air.  Alert awake x 4.  Without any impulsiveness or confusion. Respiratory system: Clear to auscultation. Respiratory effort normal.  No added sounds. Cardiovascular system: S1 & S2 heard, RRR.  Gastrointestinal system: Abdomen is mildly tender.   Colostomy bag with small amount of loose stool..  Bowel sounds present. Incisions intact.  Diffusely tender. Central nervous system: Alert and oriented.  Mentation is clear but patient is depressed and frustrated. Extremities: Symmetric 5 x 5 power.    Data Reviewed: I have personally reviewed following labs and imaging studies  CBC: Recent Labs  Lab 04/06/22 0207 04/07/22 1041 04/09/22 0450 04/10/22 1030 04/11/22 0411  WBC 10.8* 11.2* 10.4 11.9* 11.7*  HGB 8.5* 9.3* 9.1* 8.2* 6.8*  HCT 27.0* 30.5* 28.6* 25.9* 22.0*  MCV 101.1* 103.7* 99.7 100.0 102.3*  PLT 298 339 405* 422* 154*   Basic Metabolic Panel: Recent Labs  Lab 04/05/22 0208 04/06/22 0207 04/07/22 0325 04/09/22 0450 04/10/22 0221 04/11/22 0411 04/11/22 0849  NA 136 134* 133* 135 137 138  --   K 3.5 3.5 4.2 3.8 3.4* 3.6  --   CL 102 102 100 103 103 106  --   CO2 '28 27 26 26 27 27  '$ --   GLUCOSE 132* 149* 91 97 160* 125*  --   BUN 8 6* 8 6* 10 10  --   CREATININE 0.64 0.60 0.52 0.83 0.77 0.57  --   CALCIUM 7.5* 7.5* 7.9* 8.0* 7.6* 7.5*  --   MG 2.2 2.4 2.3 2.2 2.3  --   --   PHOS 2.2* 2.7 2.7  --  2.2*  --  2.4*    GFR: Estimated Creatinine Clearance: 56.2 mL/min (by C-G formula based on SCr of 0.57 mg/dL). Liver Function Tests: Recent Labs  Lab 04/05/22 0208 04/06/22 0207 04/10/22 0221  AST '20 15 16  '$ ALT '13 11 11  '$ ALKPHOS 46 47 53  BILITOT 0.6 0.4 0.3  PROT 5.2* 5.6* 5.7*  ALBUMIN 1.8* 2.0* 1.9*   No results for input(s): "LIPASE", "AMYLASE" in the last 168 hours.  No results for input(s): "AMMONIA" in the last 168 hours. Coagulation Profile: No results for input(s): "INR", "PROTIME" in the last 168 hours.  Cardiac Enzymes: No results for input(s): "CKTOTAL", "CKMB", "CKMBINDEX", "TROPONINI" in the last 168 hours. BNP (last 3 results) No results for input(s): "PROBNP" in the last 8760 hours. HbA1C: No results for input(s): "HGBA1C" in the last 72 hours.  CBG: Recent Labs  Lab 04/10/22 1111 04/10/22 1721 04/10/22 2327 04/11/22 0552 04/11/22 1047  GLUCAP 160* 130* 120* 162* 156*   Lipid Profile: No results for input(s): "CHOL", "HDL", "LDLCALC", "TRIG", "CHOLHDL", "LDLDIRECT" in the last 72 hours.  Thyroid Function Tests: No results for input(s): "TSH", "T4TOTAL", "FREET4", "T3FREE", "THYROIDAB" in the last 72 hours. Anemia Panel: No results for  input(s): "VITAMINB12", "FOLATE", "FERRITIN", "TIBC", "IRON", "RETICCTPCT" in the last 72 hours. Sepsis Labs: No results for input(s): "PROCALCITON", "LATICACIDVEN" in the last 168 hours.  No results found for this or any previous visit (from the past 240 hour(s)).       Radiology Studies: No results found.      Scheduled Meds:  sodium chloride   Intravenous Once   Chlorhexidine Gluconate Cloth  6 each Topical Daily   FLUoxetine  20 mg Oral Daily   insulin aspart  0-9 Units Subcutaneous Q6H   levothyroxine  84 mcg Intravenous Daily   lidocaine  1 patch Transdermal Q24H   sodium chloride flush  10-40 mL Intracatheter Q12H   sodium chloride flush  10-40 mL Intracatheter Q12H   sodium chloride flush  3 mL Intravenous  Q12H   Continuous Infusions:  sodium chloride     sodium chloride Stopped (04/10/22 0819)   famotidine (PEPCID) IV 20 mg (04/10/22 2039)   ondansetron (ZOFRAN) IV     piperacillin-tazobactam (ZOSYN)  IV     potassium PHOSPHATE IVPB (in mmol)     promethazine (PHENERGAN) injection (IM or IVPB) 200 mL/hr at 04/10/22 1720   TPN ADULT (ION) 75 mL/hr at 04/10/22 1720   TPN ADULT (ION)       LOS: 13 days    Time spent: 35 minutes    Barb Merino, MD [facility] Pager [phone]

## 2022-04-11 NOTE — Progress Notes (Addendum)
Pt having small clots rectally x 5 occurring more often than before. A. Chavez,NP made aware via secure chat. Received new orders.VSS. Will cont to monitor.

## 2022-04-11 NOTE — Progress Notes (Signed)
Received critical result hemoglobin 6.8. Pt cont to have clots rectally x3 last one was 0330.Notified on call A. Zebedee Iba, NP via secure chat. Received new orders.

## 2022-04-11 NOTE — Progress Notes (Signed)
11 Days Post-Op   Subjective/Chief Complaint: Very frustrated. Wants to go home   Objective: Vital signs in last 24 hours: Temp:  [97.6 F (36.4 C)-100.4 F (38 C)] 100.4 F (38 C) (01/06 0853) Pulse Rate:  [63-96] 96 (01/06 0853) Resp:  [15-18] 16 (01/06 0853) BP: (103-163)/(52-82) 103/82 (01/06 0853) SpO2:  [96 %-98 %] 96 % (01/06 0853) Weight:  [69 kg] 69 kg (01/06 0500) Last BM Date : 04/11/22  Intake/Output from previous day: 01/05 0701 - 01/06 0700 In: 3014.6 [P.O.:60; I.V.:1803.5; IV Piggyback:1151] Out: 1153 [Urine:526; Emesis/NG output:550; Stool:77] Intake/Output this shift: Total I/O In: 250 [I.V.:250] Out: 0   General appearance: alert and cooperative Resp: clear to auscultation bilaterally Cardio: regular rate and rhythm GI: soft, mild distension  Lab Results:  Recent Labs    04/10/22 1030 04/11/22 0411  WBC 11.9* 11.7*  HGB 8.2* 6.8*  HCT 25.9* 22.0*  PLT 422* 411*   BMET Recent Labs    04/10/22 0221 04/11/22 0411  NA 137 138  K 3.4* 3.6  CL 103 106  CO2 27 27  GLUCOSE 160* 125*  BUN 10 10  CREATININE 0.77 0.57  CALCIUM 7.6* 7.5*   PT/INR No results for input(s): "LABPROT", "INR" in the last 72 hours. ABG No results for input(s): "PHART", "HCO3" in the last 72 hours.  Invalid input(s): "PCO2", "PO2"  Studies/Results: [place] EKG SITE RITE  Result Date: 04/09/2022 If Site Rite image not attached, placement could not be confirmed due to current cardiac rhythm.   Anti-infectives: Anti-infectives (From admission, onward)    Start     Dose/Rate Route Frequency Ordered Stop   04/05/22 1600  piperacillin-tazobactam (ZOSYN) IVPB 3.375 g        3.375 g 12.5 mL/hr over 240 Minutes Intravenous Every 8 hours 04/05/22 1222 04/10/22 1230   03/31/22 1045  cefoTEtan (CEFOTAN) 2 g in sodium chloride 0.9 % 100 mL IVPB        2 g 200 mL/hr over 30 Minutes Intravenous On call to O.R. 03/31/22 0959 03/31/22 1225   03/29/22 0800   piperacillin-tazobactam (ZOSYN) IVPB 3.375 g  Status:  Discontinued        3.375 g 12.5 mL/hr over 240 Minutes Intravenous Every 8 hours 03/29/22 0746 04/05/22 0826       Assessment/Plan: s/p Procedure(s): XI ROBOTIC ASSISTED LOWER ANTERIOR RESECTION (N/A) Continue ng and bowel rest for ileus Bleeding per rectum. Transfuse and hold blood thinner POD#11 s/p robotic lower anterior rectosigmoid resection with colostomy and drainage of pelvic abscess by Dr. 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 transfuse, continue to monitor  - drain removed  - WOC following for new ostomy - overall doing well with it. She will need a little more teaching prior to dc - repeat CT    FEN: TPN, NGT to LIWS ID: zosyn 12/31>1/5 VTE: hold LMWH   - below per TRH -  Chronic diastolic HF Hypothyroidism Pulmonary fibrosis HTN   LOS: 13 days    Alexandria [name] 04/11/2022

## 2022-04-11 NOTE — Progress Notes (Signed)
PHARMACY - TOTAL PARENTERAL NUTRITION CONSULT NOTE   Indication: Prolonged ileus  Patient Measurements: Height: '5\' 4"'$  (162.6 cm) Weight: 69 kg (152 lb 1.9 oz) IBW/kg (Calculated) : 54.7 TPN AdjBW (KG): 63 Body mass index is 26.11 kg/m. Usual Weight:   Assessment:  Pharmacy is consulted to dose TPN in 77 yo female with prolonged ileus. Pt  is s/p robotic lower anterior rectosigmoid resection with colostomy and drainage of pelvic abscess by Dr. [name] 12/26.   Glucose / Insulin:  No history of DM, A1c 5.4 (03/18/20).  - CBGs:  130-162 - sSSI:  used 5 units / 24 hrs Electrolytes: WNL, Phos low but improved Renal: SCr < 1, BUN 10 Hepatic: LFT, alk phos, Tbili WNL  (1/5) - albumin low at 1.9 - TG 78 ( 04/05/22)  Intake / Output; MIVF: Not completely recorded - 0.9%NaCl @ 10 ml/hr, PRBC transfusion 04/11/22 - Output:  Urine x6 unmeasured, Stool x9, Colostomy 77 mL, NG 550 mL - Bloody   GI Imaging: - 12/24: Current findings are consistent with complicated sigmoid diverticulitis. Distal sigmoid colon shows wall thickening and adjacent inflammation. - 1/1 CT: decreasing residual pelvic abscess, ileus, ostomy, distended gallbladder, small left pleural effusion w/ pulm fibrosis  GI Surgeries / Procedures:  - 12/26: robotic lower anterior rectosigmoid resection with colostomy and drainage of pelvic abscess   Central access:   1/4: Replaced PICC  TPN start date:  12/30-1/2, restarted 1/4   Nutritional Goals: Goal TPN rate is 75 mL/hr (provides 90 g of protein and 1818 kcals per day)  RD Assessment: Estimated Needs Total Energy Estimated Needs: 1700-1900 kcals Total Protein Estimated Needs: 85-100 g Total Fluid Estimated Needs: >/= 1.7 L  Current Nutrition:  NPO and TPN  Plan:  Now:  KPhos 30 mmol IV x1 dose  Continue TPN at 75 mL/hr at 1800, the goal rate  Electrolytes in TPN:  Na 173mq/L  K 671m/L  Ca 46m42mL  Mg 4 mEq/L  Phos 25 mmol/L  Cl:Ac 1:1  Add standard MVI  and trace elements to TPN Resume CBGs and SSI q 6hrs  mIVF: 0.9% NaCl @ 10 ml/hr TPN lab panel Mon/Thurs and prn   ChrGretta ArabarmD, BCPS [facility] main pharmacy [phone]/2024 8:50 AM

## 2022-04-12 ENCOUNTER — Inpatient Hospital Stay (HOSPITAL_COMMUNITY): Payer: Medicare Other

## 2022-04-12 DIAGNOSIS — K5730 Diverticulosis of large intestine without perforation or abscess without bleeding: Secondary | ICD-10-CM | POA: Diagnosis not present

## 2022-04-12 LAB — BASIC METABOLIC PANEL
Anion gap: 5 (ref 5–15)
BUN: 12 mg/dL (ref 8–23)
CO2: 26 mmol/L (ref 22–32)
Calcium: 7.7 mg/dL — ABNORMAL LOW (ref 8.9–10.3)
Chloride: 107 mmol/L (ref 98–111)
Creatinine, Ser: 0.54 mg/dL (ref 0.44–1.00)
GFR, Estimated: >60 mL/min (ref >60)
Glucose, Bld: 110 mg/dL — ABNORMAL HIGH (ref 70–99)
Potassium: 3.8 mmol/L (ref 3.5–5.1)
Sodium: 138 mmol/L (ref 135–145)

## 2022-04-12 LAB — GLUCOSE, CAPILLARY
Glucose-Capillary: 137 mg/dL — ABNORMAL HIGH (ref 70–99)
Glucose-Capillary: 130 mg/dL — ABNORMAL HIGH (ref 70–99)
Glucose-Capillary: 114 mg/dL — ABNORMAL HIGH (ref 70–99)

## 2022-04-12 LAB — PHOSPHORUS: Phosphorus: 3 mg/dL (ref 2.5–4.6)

## 2022-04-12 MED ORDER — MIDAZOLAM HCL 2 MG/2ML IJ SOLN
INTRAMUSCULAR | Status: AC
  Filled 2022-04-12: qty 2

## 2022-04-12 MED ORDER — ONDANSETRON HCL 4 MG/2ML IJ SOLN
INTRAMUSCULAR | Status: AC
  Filled 2022-04-12: qty 2

## 2022-04-12 MED ORDER — TRAVASOL 10 % IV SOLN
INTRAVENOUS | Status: AC
  Filled 2022-04-12: qty 900

## 2022-04-12 MED ORDER — ONDANSETRON HCL 4 MG/2ML IJ SOLN
INTRAMUSCULAR | Status: AC | PRN
  Administered 2022-04-12: 4 mg via INTRAVENOUS

## 2022-04-12 MED ORDER — SODIUM CHLORIDE 0.9% FLUSH
5.0000 mL | Freq: Three times a day (TID) | INTRAVENOUS | Status: DC
  Administered 2022-04-12 – 2022-04-13 (×2): 5 mL

## 2022-04-12 MED ORDER — FENTANYL CITRATE (PF) 100 MCG/2ML IJ SOLN
INTRAMUSCULAR | Status: AC
  Filled 2022-04-12: qty 2

## 2022-04-12 MED ORDER — FENTANYL CITRATE (PF) 100 MCG/2ML IJ SOLN
INTRAMUSCULAR | Status: AC | PRN
  Administered 2022-04-12: 50 ug via INTRAVENOUS
  Administered 2022-04-12: 25 ug via INTRAVENOUS

## 2022-04-12 MED ORDER — MIDAZOLAM HCL 2 MG/2ML IJ SOLN
INTRAMUSCULAR | Status: AC | PRN
  Administered 2022-04-12: .5 mg via INTRAVENOUS
  Administered 2022-04-12: 1 mg via INTRAVENOUS

## 2022-04-12 NOTE — Progress Notes (Signed)
Physical Therapy Treatment Patient Details Name: Alexandria [name] MRN: [id number] DOB: December 07, 1945 Today's Date: 04/12/2022   History of Present Illness 77 y/o female presented with continued constipation and diffuse abdominal pain, worst in the LUQ. Now s/p robotic lower anterior rectosigmoid resection with colostomy and drainage of pelvic abscess 03/31/22.  Plan for transgluteal pelvic drain placement on 04/12/22.  PHMx:hypothyroidism, grade I DD, HTN, IPF, diverticulosis, diverticulitis, DVT, chronic hypokalemia    PT Comments    Pt reports "sciatica pain" acting up and requested to ambulate however limited distance to within room. Pt reports going for transgluteal drain placement later today and declined further activity.  Pt assisted with use of BSC prior to return to bed.  Pt feels she could potentially d/c home however requests more education/training with colostomy.     Recommendations for follow up therapy are one component of a multi-disciplinary discharge planning process, led by the attending physician.  Recommendations may be updated based on patient status, additional functional criteria and insurance authorization.  Follow Up Recommendations  Home health PT     Assistance Recommended at Discharge Intermittent Supervision/Assistance  Patient can return home with the following A little help with bathing/dressing/bathroom;Assistance with cooking/housework;Assist for transportation;Help with stairs or ramp for entrance   Equipment Recommendations  Rollator (4 wheels)    Recommendations for Other Services       Precautions / Restrictions Precautions Precautions: Other (comment) Precaution Comments: abdominal surgery, NGT Restrictions Weight Bearing Restrictions: No     Mobility  Bed Mobility Overal bed mobility: Needs Assistance Bed Mobility: Supine to Sit, Sit to Supine     Supine to sit: Min assist Sit to supine: Min assist   General bed mobility comments:  encouraged log roll technique however pt has her own method, assist for trunk upright, assist for LEs onto bed for pain control    Transfers Overall transfer level: Needs assistance Equipment used: Rolling walker (2 wheels) Transfers: Sit to/from Stand Sit to Stand: Min guard           General transfer comment: assist for line management    Ambulation/Gait Ambulation/Gait assistance: Min guard Gait Distance (Feet): 16 Feet Assistive device: Rolling walker (2 wheels) Gait Pattern/deviations: Step-through pattern, Decreased stride length       General Gait Details: ambulated within room per pt preference   Stairs             Wheelchair Mobility    Modified Rankin (Stroke Patients Only)       Balance                                            Cognition Arousal/Alertness: Awake/alert Behavior During Therapy: WFL for tasks assessed/performed Overall Cognitive Status: Within Functional Limits for tasks assessed                                          Exercises      General Comments        Pertinent Vitals/Pain Pain Assessment Pain Assessment: Faces Faces Pain Scale: Hurts a little bit Pain Location: abdomen Pain Descriptors / Indicators: Sore Pain Intervention(s): Repositioned, Monitored during session    Home Living  Prior Function            PT Goals (current goals can now be found in the care plan section) Progress towards PT goals: Progressing toward goals    Frequency    Min 3X/week      PT Plan Current plan remains appropriate    Co-evaluation              AM-PAC PT "6 Clicks" Mobility   Outcome Measure  Help needed turning from your back to your side while in a flat bed without using bedrails?: A Little Help needed moving from lying on your back to sitting on the side of a flat bed without using bedrails?: A Little Help needed moving to and from a  bed to a chair (including a wheelchair)?: A Little Help needed standing up from a chair using your arms (e.g., wheelchair or bedside chair)?: A Little Help needed to walk in hospital room?: A Little Help needed climbing 3-5 steps with a railing? : A Little 6 Click Score: 18    End of Session   Activity Tolerance: Patient tolerated treatment well Patient left: in bed;with call bell/phone within reach   PT Visit Diagnosis: Difficulty in walking, not elsewhere classified (R26.2)     Time: 3276-1470 PT Time Calculation (min) (ACUTE ONLY): 15 min  Charges:  $Gait Training: 8-22 mins                    Arlyce Dice, DPT Physical Therapist Acute Rehabilitation Services Preferred contact method: Secure Chat Weekend Pager Only: [phone] Office: [place] 04/12/2022, 11:31 AM

## 2022-04-12 NOTE — Progress Notes (Signed)
PHARMACY - TOTAL PARENTERAL NUTRITION CONSULT NOTE   Indication: Prolonged ileus  Patient Measurements: Height: '5\' 4"'$  (162.6 cm) Weight: 70.1 kg (154 lb 8.7 oz) IBW/kg (Calculated) : 54.7 TPN AdjBW (KG): 63 Body mass index is 26.53 kg/m. Usual Weight:   Assessment:  Pharmacy is consulted to dose TPN in 77 yo female with prolonged ileus. Pt  is s/p robotic lower anterior rectosigmoid resection with colostomy and drainage of pelvic abscess by Dr. [name] 12/26.   Glucose / Insulin:  No history of DM, A1c 5.4 (03/18/20).  - CBGs:  137-162 - sSSI:  used 6 units / 24 hrs Electrolytes: WNL, Phos improved after supplementation Renal: SCr < 1, BUN WNL Hepatic: LFT, alk phos, Tbili WNL  (1/5) - albumin low at 1.9 - TG 78 ( 04/05/22)  Intake / Output; MIVF: Not completely recorded; 1/6 CT shows anasarca - 0.9%NaCl @ 10 ml/hr, PRBC transfusion 04/11/22 - Output:  Urine x6 unmeasured, Stool x3, Colostomy 50 mL, NG 300 mL - Bloody rectal secretions reported  GI Imaging: - 56/25: complicated sigmoid diverticulitis. Distal sigmoid colon shows wall thickening and adjacent inflammation. - 1/1 CT: decreasing residual pelvic abscess, ileus, ostomy, distended gallbladder, small left pleural effusion w/ pulm fibrosis - 1/6 CT:  pelvic abscess, concern for stump leak  GI Surgeries / Procedures:  - 12/26: robotic lower anterior rectosigmoid resection with colostomy and drainage of pelvic abscess  - 1/7 IR planning for transgluteal pelvic drain placement  Central access:   1/4: Replaced PICC TPN start date:  12/30-1/2, restarted 1/4   Nutritional Goals: Goal TPN rate is 75 mL/hr (provides 90 g of protein and 1818 kcals per day)  RD Assessment: Estimated Needs Total Energy Estimated Needs: 1700-1900 kcals Total Protein Estimated Needs: 85-100 g Total Fluid Estimated Needs: >/= 1.7 L  Current Nutrition:  NPO and TPN  Plan:  Continue TPN at 75 mL/hr, goal rate  Electrolytes in TPN:  Na  170mq/L  K 629m/L  Ca 38m56mL  Mg 4 mEq/L  Phos 25 mmol/L  Cl:Ac 1:1  Continue standard MVI and trace elements in TPN Continue CBGs and SSI q 6hrs  mIVF: 0.9% NaCl @ 10 ml/hr TPN lab panel Mon/Thurs and prn   ChrGretta ArabarmD, BCPS [facility] main pharmacy [phone]/2024 10:11 AM

## 2022-04-12 NOTE — Progress Notes (Signed)
PROGRESS NOTE    Alexandria [name]  DXA:[id number] DOB: 12-22-1945 DOA: 03/28/2022 PCP: [name], DO    Brief Narrative:  77 y/o with a history of hypothyroidism, grade I DD, HTN, IPF, diverticulosis, diverticulitis, DVT, chronic hypokalemia who has a long history of constipation and has recently tried Linzess but stopped after 2 weeks due to lack of efficacy by her report. She recently got CT abd/pelvis 03/20/22 which revealed an enhancing lesion/mass just below the sigmoid-rectal junction. Flex Sig was performed 03/20/22 which revealed a polypoid mass at the sigmoid-rectal junction. Bx was readout by pathology as an inflammatory poly w/o dysplasia or malignancy. Patient had MRI abd/pelvis 03/26/22 -showed severe sigmoid diverticulitis.  Due to continued constipation and diffuse abdominal pain, worst in the LUQ, she presented to [facility] for evaluation. General surgery was consulted. Patient underwent repeat CT abd/pelvis and thought to have complicated diverticulitis with abscess. Unable to drain per IR.  Ultimately, she underwent rectosigmoid resection with colostomy by Dr. [name] 12/26.   With poor recovery postop and persistent ileus. 1/6, recurrence of fever and worsening abdominal pain.  Bleeding from the rectum.  Repeat CT scan consistent with collection of abscess, suspected leakage from the stump.     Assessment & Plan:   Complicated sigmoid diverticulitis with pelvic abscess: Persistent ileus.  Postoperative intra-abdominal collection. Rectosigmoid resection and colostomy Dr. [name] 12/26.   Biopsy with evidence of diverticulitis with pericolonic abscess and perforation.  No malignancy. Started on TPN with PICC line and had NG tube inserted.  Accidentally came off. 1/4, with persistent ileus, NG tube reinserted.  Back on TPN.  Trying IV Tylenol and Toradol and fentanyl for pain relief. Continues to have rectal bleeding, intermittent likely from the stump. 1/6, persistent  pain.  Recurrent low-grade fever. Repeat blood cultures, continue IV Zosyn.  CT scan with intra-abdominal collection.  IR consulted to drain.  Symptomatic anemia due to postop blood loss: Previous hemoglobin 12-13.  Hemoglobin dropped to 7.3-1 unit of PRBC-9.8-7.7-8.5-9.3-9.1-6.8-1 unit 8.2. Discontinue Lovenox and NSAIDs.  Hospital-acquired delirium: Patient developed impulsiveness and occasional confusion.  Likely related to hospitalization, medications and severe medical illness. Frequent reorientation. Mental status is clear today however patient is very depressed and anxious. Currently on Ativan through IV.  Could not tolerate duloxetine.  Chronic medical issues including Chronic diastolic congestive heart failure, diuresis on hold.  On maintenance IV fluids and TPN.  Euvolemic. Hypothyroidism, unreliable oral intake.  Continue IV Synthroid. Idiopathic pulmonary fibrosis: Without exacerbation. Electrolytes replaced through TPN protocol.  Urinary retention: Secondary to pelvic pain and immobility.  Foley catheter removed 1/3, she was able to urinate and bedside commode.  Will monitor closely.  Rectal bleeding and secretions: Anticipating conservative management.   DVT prophylaxis: Place and maintain sequential compression device Start: 03/30/22 0940   Code Status: Full code Family Communication: None at the bedside. Disposition Plan: Status is: Inpatient Remains inpatient appropriate because: Significant abdominal pain and distention, recovery from surgery.  Persistent ileus.     Consultants:  General surgery  Procedures:  Rectosigmoid resection and colostomy  Antimicrobials:  IV Zosyn 12/23----   Subjective:  Seen and examined.  Persistent pain.  Anxious.  Aware about going for abdominal drainage.  Objective: Vitals:   04/11/22 0853 04/11/22 1206 04/11/22 2153 04/12/22 0600  BP: 103/82 (!) 158/75 (!) 146/63 (!) 147/64  Pulse: 96 95 83 78  Resp: '16 16 16 16   '$ Temp: (!) 100.4 F (38 C) 98.9 F (37.2 C) 98.3 F (36.8  C) 98 F (36.7 C)  TempSrc: Oral Oral Oral Oral  SpO2: 96% 97% 97% 97%  Weight:    70.1 kg  Height:        Intake/Output Summary (Last 24 hours) at 04/12/2022 1014 Last data filed at 04/12/2022 0753 Gross per 24 hour  Intake 2921.02 ml  Output 350 ml  Net 2571.02 ml    Filed Weights   04/10/22 0500 04/11/22 0500 04/12/22 0600  Weight: 69.5 kg 69 kg 70.1 kg    Examination:  General exam: Anxious.  Mostly distressed with episodic pain and discomfort.  On room air.  Alert awake x 4.  Without any impulsiveness or confusion. Respiratory system: Clear to auscultation. Respiratory effort normal.  No added sounds. Cardiovascular system: S1 & S2 heard, RRR.  Gastrointestinal system: Abdomen is mildly tender.   Colostomy bag with small amount of loose stool..  Bowel sounds present. Incisions intact.  Diffusely tender all over. Central nervous system: Alert and oriented.  Mentation is clear but patient is depressed and frustrated. Extremities: Symmetric 5 x 5 power.    Data Reviewed: I have personally reviewed following labs and imaging studies  CBC: Recent Labs  Lab 04/06/22 0207 04/07/22 1041 04/09/22 0450 04/10/22 1030 04/11/22 0411 04/11/22 1253  WBC 10.8* 11.2* 10.4 11.9* 11.7*  --   HGB 8.5* 9.3* 9.1* 8.2* 6.8* 8.2*  HCT 27.0* 30.5* 28.6* 25.9* 22.0* 25.5*  MCV 101.1* 103.7* 99.7 100.0 102.3*  --   PLT 298 339 405* 422* 411*  --     Basic Metabolic Panel: Recent Labs  Lab 04/06/22 0207 04/07/22 0325 04/09/22 0450 04/10/22 0221 04/11/22 0411 04/11/22 0849 04/12/22 0302  NA 134* 133* 135 137 138  --  138  K 3.5 4.2 3.8 3.4* 3.6  --  3.8  CL 102 100 103 103 106  --  107  CO2 '27 26 26 27 27  '$ --  26  GLUCOSE 149* 91 97 160* 125*  --  110*  BUN 6* 8 6* 10 10  --  12  CREATININE 0.60 0.52 0.83 0.77 0.57  --  0.54  CALCIUM 7.5* 7.9* 8.0* 7.6* 7.5*  --  7.7*  MG 2.4 2.3 2.2 2.3  --   --   --   PHOS 2.7  2.7  --  2.2*  --  2.4* 3.0    GFR: Estimated Creatinine Clearance: 56.6 mL/min (by C-G formula based on SCr of 0.54 mg/dL). Liver Function Tests: Recent Labs  Lab 04/06/22 0207 04/10/22 0221  AST 15 16  ALT 11 11  ALKPHOS 47 53  BILITOT 0.4 0.3  PROT 5.6* 5.7*  ALBUMIN 2.0* 1.9*    No results for input(s): "LIPASE", "AMYLASE" in the last 168 hours.  No results for input(s): "AMMONIA" in the last 168 hours. Coagulation Profile: No results for input(s): "INR", "PROTIME" in the last 168 hours.  Cardiac Enzymes: No results for input(s): "CKTOTAL", "CKMB", "CKMBINDEX", "TROPONINI" in the last 168 hours. BNP (last 3 results) No results for input(s): "PROBNP" in the last 8760 hours. HbA1C: No results for input(s): "HGBA1C" in the last 72 hours.  CBG: Recent Labs  Lab 04/11/22 0552 04/11/22 1047 04/11/22 1811 04/11/22 2356 04/12/22 0602  GLUCAP 162* 156* 147* 140* 137*    Lipid Profile: No results for input(s): "CHOL", "HDL", "LDLCALC", "TRIG", "CHOLHDL", "LDLDIRECT" in the last 72 hours.  Thyroid Function Tests: No results for input(s): "TSH", "T4TOTAL", "FREET4", "T3FREE", "THYROIDAB" in the last 72 hours. Anemia Panel: No results  for input(s): "VITAMINB12", "FOLATE", "FERRITIN", "TIBC", "IRON", "RETICCTPCT" in the last 72 hours. Sepsis Labs: No results for input(s): "PROCALCITON", "LATICACIDVEN" in the last 168 hours.  Recent Results (from the past 240 hour(s))  Culture, blood (Routine X 2) w Reflex to ID Panel     Status: None (Preliminary result)   Collection Time: 04/11/22 12:53 PM   Specimen: BLOOD RIGHT HAND  Result Value Ref Range Status   Specimen Description   Final    BLOOD RIGHT HAND BOTTLES DRAWN AEROBIC ONLY Performed at [facility], [address] [place], [place] [address]    Special Requests   Final    Blood Culture adequate volume Performed at [facility] [address], [place], [place] [address]    Culture  PENDING  Incomplete   Report Status PENDING  Incomplete         Radiology Studies: CT ABDOMEN PELVIS WO CONTRAST  Result Date: 04/11/2022 CLINICAL DATA:  Postoperative abdominal pain EXAM: CT ABDOMEN AND PELVIS WITHOUT CONTRAST TECHNIQUE: Multidetector CT imaging of the abdomen and pelvis was performed following the standard protocol without IV contrast. RADIATION DOSE REDUCTION: This exam was performed according to the departmental dose-optimization program which includes automated exposure control, adjustment of the mA and/or kV according to patient size and/or use of iterative reconstruction technique. 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 Hepatobiliary: No solid liver abnormality is seen. No gallstones, gallbladder wall thickening, or biliary dilatation. Pancreas: Unremarkable. No pancreatic ductal dilatation or surrounding inflammatory changes. Spleen: Normal in size without significant abnormality. Adrenals/Urinary Tract: Adrenal glands are unremarkable. Kidneys are normal, without renal calculi, solid lesion, or hydronephrosis. Bladder is unremarkable. Stomach/Bowel: Esophagogastric tube with tip and side port below the diaphragm. Stomach is within normal limits. Appendix appears normal. Status post sigmoid colon resection. Vascular/Lymphatic: Aortic atherosclerosis. No enlarged abdominal or pelvic lymph nodes. Reproductive: Status post hysterectomy. Other: Severe anasarca. Left lower quadrant end colostomy. Interval removal of a surgical drain previously seen in the low pelvis, with increased volume of frothy, loculated appearing air and fluid in the posterior pelvis adjacent to the rectal stump, collection measuring at least 8.9 x 8.1 x 6.7 cm (series 2, image 74, series 6, image 84). Musculoskeletal: No acute or significant osseous findings. IMPRESSION: 1. Interval removal of a surgical  drain previously seen in the low pelvis, with increased volume of frothy, loculated appearing air and fluid in the posterior pelvis adjacent to the rectal stump, collection measuring at least 8.9 x 8.1 x 6.7 cm. Findings are consistent with abscess. The stump may be assessed for leak via fluoroscopy if desired. 2. 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 3. Severe anasarca. 4. 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 Aortic Atherosclerosis (ICD10-I70.0). Electronically Signed   By: Delanna Ahmadi M.D.   On: 04/11/2022 13:55        Scheduled Meds:  sodium chloride   Intravenous Once   Chlorhexidine Gluconate Cloth  6 each Topical Daily   insulin aspart  0-9 Units Subcutaneous Q6H   levothyroxine  84 mcg Intravenous Daily   lidocaine  1 patch Transdermal Q24H   sodium chloride flush  10-40 mL Intracatheter Q12H   sodium chloride flush  10-40 mL Intracatheter Q12H   sodium chloride flush  3 mL Intravenous Q12H   Continuous Infusions:  sodium chloride     sodium chloride  Stopped (04/10/22 5498)   famotidine (PEPCID) IV 20 mg (04/12/22 0928)   ondansetron (ZOFRAN) IV     piperacillin-tazobactam (ZOSYN)  IV 3.375 g (04/12/22 0537)   promethazine (PHENERGAN) injection (IM or IVPB) 200 mL/hr at 04/10/22 1720   TPN ADULT (ION) 75 mL/hr at 04/11/22 1733     LOS: 14 days    Time spent: 35 minutes    Barb Merino, MD [facility] Pager [phone]

## 2022-04-12 NOTE — Progress Notes (Signed)
12 Days Post-Op   Subjective/Chief Complaint: Complains of pain overnight, some nausea   Objective: Vital signs in last 24 hours: Temp:  [98 F (36.7 C)-100.4 F (38 C)] 98 F (36.7 C) (01/07 0600) Pulse Rate:  [78-96] 78 (01/07 0600) Resp:  [16] 16 (01/07 0600) BP: (103-158)/(63-82) 147/64 (01/07 0600) SpO2:  [96 %-97 %] 97 % (01/07 0600) Weight:  [70.1 kg] 70.1 kg (01/07 0600) Last BM Date : 04/12/22  Intake/Output from previous day: 01/06 0701 - 01/07 0700 In: 3521 [P.O.:150; I.V.:2269.9; Blood:420; IV Piggyback:681.2] Out: 350 [Emesis/NG output:300; Stool:50] Intake/Output this shift: Total I/O In: 30 [P.O.:30] Out: 0   General appearance: alert and cooperative Resp: clear to auscultation bilaterally Cardio: regular rate and rhythm GI: soft, mild distension  Lab Results:  Recent Labs    04/10/22 1030 04/11/22 0411 04/11/22 1253  WBC 11.9* 11.7*  --   HGB 8.2* 6.8* 8.2*  HCT 25.9* 22.0* 25.5*  PLT 422* 411*  --     BMET Recent Labs    04/11/22 0411 04/12/22 0302  NA 138 138  K 3.6 3.8  CL 106 107  CO2 27 26  GLUCOSE 125* 110*  BUN 10 12  CREATININE 0.57 0.54  CALCIUM 7.5* 7.7*    PT/INR No results for input(s): "LABPROT", "INR" in the last 72 hours. ABG No results for input(s): "PHART", "HCO3" in the last 72 hours.  Invalid input(s): "PCO2", "PO2"  Studies/Results: CT ABDOMEN PELVIS WO CONTRAST  Result Date: 04/11/2022 CLINICAL DATA:  Postoperative abdominal pain EXAM: CT ABDOMEN AND PELVIS WITHOUT CONTRAST TECHNIQUE: Multidetector CT imaging of the abdomen and pelvis was performed following the standard protocol without IV contrast. RADIATION DOSE REDUCTION: This exam was performed according to the departmental dose-optimization program which includes automated exposure control, adjustment of the mA and/or kV according to patient size and/or use of iterative reconstruction technique. 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 Hepatobiliary: No solid liver abnormality is seen. No gallstones, gallbladder wall thickening, or biliary dilatation. Pancreas: Unremarkable. No pancreatic ductal dilatation or surrounding inflammatory changes. Spleen: Normal in size without significant abnormality. Adrenals/Urinary Tract: Adrenal glands are unremarkable. Kidneys are normal, without renal calculi, solid lesion, or hydronephrosis. Bladder is unremarkable. Stomach/Bowel: Esophagogastric tube with tip and side port below the diaphragm. Stomach is within normal limits. Appendix appears normal. Status post sigmoid colon resection. Vascular/Lymphatic: Aortic atherosclerosis. No enlarged abdominal or pelvic lymph nodes. Reproductive: Status post hysterectomy. Other: Severe anasarca. Left lower quadrant end colostomy. Interval removal of a surgical drain previously seen in the low pelvis, with increased volume of frothy, loculated appearing air and fluid in the posterior pelvis adjacent to the rectal stump, collection measuring at least 8.9 x 8.1 x 6.7 cm (series 2, image 74, series 6, image 84). Musculoskeletal: No acute or significant osseous findings. IMPRESSION: 1. Interval removal of a surgical drain previously seen in the low pelvis, with increased volume of frothy, loculated appearing air and fluid in the posterior pelvis adjacent to the rectal stump, collection measuring at least 8.9 x 8.1 x 6.7 cm. Findings are consistent with abscess. The stump may be assessed for leak via fluoroscopy if desired. 2. 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 3. Severe anasarca. 4. 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 Aortic Atherosclerosis (ICD10-I70.0). Electronically Signed   By: Alexandria [name] M.D.   On: 04/11/2022 13:55  Anti-infectives: Anti-infectives (From admission, onward)    Start      Dose/Rate Route Frequency Ordered Stop   04/11/22 1400  piperacillin-tazobactam (ZOSYN) IVPB 3.375 g        3.375 g 12.5 mL/hr over 240 Minutes Intravenous Every 8 hours 04/11/22 1143 04/16/22 1359   04/05/22 1600  piperacillin-tazobactam (ZOSYN) IVPB 3.375 g        3.375 g 12.5 mL/hr over 240 Minutes Intravenous Every 8 hours 04/05/22 1222 04/10/22 1230   03/31/22 1045  cefoTEtan (CEFOTAN) 2 g in sodium chloride 0.9 % 100 mL IVPB        2 g 200 mL/hr over 30 Minutes Intravenous On call to O.R. 03/31/22 0959 03/31/22 1225   03/29/22 0800  piperacillin-tazobactam (ZOSYN) IVPB 3.375 g  Status:  Discontinued        3.375 g 12.5 mL/hr over 240 Minutes Intravenous Every 8 hours 03/29/22 0746 04/05/22 0826       Assessment/Plan: s/p Procedure(s): XI ROBOTIC ASSISTED LOWER ANTERIOR RESECTION (N/A) Continue ng and bowel rest for ileus Bleeding per rectum. Transfuse and hold blood thinner POD#12 s/p robotic lower anterior rectosigmoid resection with colostomy and drainage of pelvic abscess by Dr. 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 She will need a little more teaching prior to dc   FEN: TPN, NGT to LIWS ID: [email or web address] 12/31>1/5 VTE: hold LMWH   - below per TRH -  Chronic diastolic HF Hypothyroidism Pulmonary fibrosis HTN   LOS: 14 days    Alexandria [name] 09/09/6810

## 2022-04-12 NOTE — Consult Note (Signed)
Chief Complaint: Patient was seen in consultation today for  Chief Complaint  Patient presents with   Abdominal Pain   at the request of * No referring provider recorded for this case *  Referring Physician(s): * No referring provider recorded for this case *  Supervising Physician: Michaelle Birks  Patient Status: [facility] - In-pt  History of Present Illness: Alexandria [name] is a 77 y.o. female history of hypothyroidism, diastolic dysfunction, HTN, idiopathic pulmonary fibrosis, diverticulosis, diverticulitis, DVT, chronic hypokalemia. Patient underwent sigmoidectomy for complicated diverticulitis with abscess on 12/26. She has not recovered well and imaging reveals recurrent abscess.  IR consulted for drain placement.  Past Medical History:  Diagnosis Date   Arthritis    Asthmatic bronchitis with acute exacerbation 02/06/2015   Bronchial pneumonia    Colon polyp    Diverticulitis    DVT (deep venous thrombosis) (HCC)    lower extremity   H/O cardiac catheterization 2004   Normal coronary arteries   Heart murmur    History of stress test 05/21/2011   Hx of echocardiogram 01/27/2010   Normal Ef 55% the transmitral spectral doppler flow pattern is normal for age. the left ventricular wall motion is normal   IPF (idiopathic pulmonary fibrosis) ([place]) 01/2018   Migraines    Pancreatitis    Thyroid disease    TIA (transient ischemic attack)    8 years ago    Past Surgical History:  Procedure Laterality Date   BREAST BIOPSY     [place]   CARDIAC CATHETERIZATION     10/2013   CATARACT EXTRACTION Right 03/22/2018   LEFT HEART CATHETERIZATION WITH CORONARY ANGIOGRAM N/A 10/25/2013   Procedure: LEFT HEART CATHETERIZATION WITH CORONARY ANGIOGRAM;  Surgeon: Troy Sine, MD;  Location: [facility] CATH LAB;  Service: Cardiovascular;  Laterality: N/A;   NECK SURGERY     x's 2   TOTAL KNEE ARTHROPLASTY     Bilateral x's 2   VAGINAL HYSTERECTOMY  10/17/1998   [name]   VIDEO  BRONCHOSCOPY Bilateral 01/24/2018   Procedure: VIDEO BRONCHOSCOPY WITH FLUORO;  Surgeon: [name], MD;  Location: [facility];  Service: Cardiopulmonary;  Laterality: Bilateral;   XI ROBOTIC ASSISTED LOWER ANTERIOR RESECTION N/A 03/31/2022   Procedure: XI ROBOTIC ASSISTED LOWER ANTERIOR RESECTION;  Surgeon: Michael Boston, MD;  Location: [facility] ORS;  Service: General;  Laterality: N/A;    Allergies: Prednisone, Dilaudid [hydromorphone], Atrovent hfa [ipratropium bromide hfa], Cheratussin ac [guaifenesin-codeine], Diltiazem, Hydrocodone, Influenza vaccines, Morphine, Motrin [ibuprofen], Oxycodone, and Codeine  Medications: Prior to Admission medications   Medication Sig Start Date End Date Taking? Authorizing Provider  aspirin EC 81 MG tablet Take 81 mg by mouth daily. Swallow whole.   Yes [provider]  benzonatate (TESSALON) 100 MG capsule Take 200 mg by mouth as needed for cough. 03/06/22  Yes [provider]  furosemide (LASIX) 40 MG tablet TAKE 1 TABLET BY MOUTH TWICE A DAY Patient taking differently: Take 40 mg by mouth 2 (two) times daily. 09/03/21  Yes Lowne Chase, Yvonne R, DO  KLOR-CON M20 20 MEQ tablet Take 1 tablet (20 mEq total) by mouth 2 (two) times daily. Will take 3 a daily only as needed. Patient taking differently: Take 20 mEq by mouth 2 (two) times daily. 01/06/22  Yes [name], DO  linaclotide [facility]) 145 MCG CAPS capsule Take 1 capsule (145 mcg total) by mouth daily before breakfast. 03/17/22  Yes Sharyn Creamer, MD  Nintedanib (OFEV) 100 MG  CAPS Take 100 mg by mouth in the morning and at bedtime. 07/03/20  Yes [provider]  ondansetron (ZOFRAN-ODT) 4 MG disintegrating tablet TAKE 1 TABLET BY MOUTH EVERY 8 HOURS AS NEEDED FOR NAUSEA AND VOMITING Patient taking differently: Take 4 mg by mouth as needed for nausea or vomiting. 07/18/21  Yes [name], DO  SYNTHROID 112 MCG tablet Take 1 tablet (112 mcg total) by  mouth daily before breakfast. 03/02/22  Yes Roma Schanz R, DO  vitamin C (ASCORBIC ACID) 500 MG tablet Take 500 mg by mouth daily.   Yes [provider]  AMBULATORY NON FORMULARY MEDICATION Medication Name: Diltiazem 2% with Lidocaine 5%- use index finger, apply small amount of medication inside the rectum up to your first knuckle/joint for 8 weeks Patient not taking: Reported on 03/29/2022 03/20/22   Sharyn Creamer, MD     Family History  Problem Relation Age of Onset   Ovarian cancer Mother    Uterine cancer Mother    Lung cancer Father    HIV Brother        11   Kidney cancer Brother    Lung cancer Brother    Other Brother        Mouth Cancer   Colon cancer Maternal Aunt    Heart disease Maternal Grandmother    Stroke Maternal Grandmother    Hypertension Maternal Grandmother    Diabetes Maternal Grandmother    Arthritis Other    Esophageal cancer Neg Hx    Liver disease Neg Hx    Rectal cancer Neg Hx    Stomach cancer Neg Hx     Social History   Socioeconomic History   Marital status: Married    Spouse name: [name]   Number of children: 3   Years of education: Jr college   Highest education level: Not on file  Occupational History   Occupation: Tour manager: UNEMPLOYED   Occupation: retired  Tobacco Use   Smoking status: Never   Smokeless tobacco: Never  Scientific laboratory technician Use: Never used  Substance and Sexual Activity   Alcohol use: No    Alcohol/week: 0.0 standard drinks of alcohol   Drug use: No   Sexual activity: Not on file  Other Topics Concern   Not on file  Social History Narrative   Lives with husband [name]   Caffeine use: coffee (2 cups per day)   Mostly right-handed   Social Determinants of Health   Financial Resource Strain: Not on file  Food Insecurity: No Food Insecurity (03/31/2022)   Hunger Vital Sign    Worried About Running Out of Food in the Last Year: Never true    [place] in the Last Year: Never  true  Transportation Needs: No Transportation Needs (03/31/2022)   PRAPARE - Hydrologist (Medical): No    Lack of Transportation (Non-Medical): No  Physical Activity: Not on file  Stress: Not on file  Social Connections: Not on file   Review of Systems  Constitutional:  Positive for activity change, appetite change and fatigue.  HENT: Negative.    Eyes: Negative.   Respiratory: Negative.    Cardiovascular: Negative.   Gastrointestinal:  Positive for abdominal distention and abdominal pain. Negative for nausea and vomiting.  Endocrine: Negative.   Genitourinary: Negative.   Musculoskeletal:  Positive for arthralgias, back pain and myalgias.  Skin: Negative.   Allergic/Immunologic: Negative.  Neurological: Negative.   Hematological: Negative.   Psychiatric/Behavioral:  The patient is nervous/anxious.     Vital Signs: BP (!) 147/64 (BP Location: Right Arm)   Pulse 78   Temp 98 F (36.7 C) (Oral)   Resp 16   Ht '5\' 4"'$  (1.626 m)   Wt 154 lb 8.7 oz (70.1 kg)   SpO2 97%   BMI 26.53 kg/m   Physical Exam Vitals reviewed.  Constitutional:      Appearance: She is well-developed. She is not toxic-appearing.  HENT:     Head: Normocephalic and atraumatic.     Mouth/Throat:     Pharynx: Oropharynx is clear.  Cardiovascular:     Rate and Rhythm: Normal rate and regular rhythm.  Pulmonary:     Effort: Pulmonary effort is normal. No respiratory distress.     Breath sounds: Wheezing present.  Abdominal:     General: There is distension.     Tenderness: There is generalized abdominal tenderness and tenderness in the suprapubic area and left lower quadrant.  Neurological:     General: No focal deficit present.     Mental Status: She is alert.  Psychiatric:        Mood and Affect: Mood is anxious and depressed.     Imaging: CT ABDOMEN PELVIS WO CONTRAST  Result Date: 04/11/2022 CLINICAL DATA:  Postoperative abdominal pain EXAM: CT ABDOMEN AND  PELVIS WITHOUT CONTRAST TECHNIQUE: Multidetector CT imaging of the abdomen and pelvis was performed following the standard protocol without IV contrast. RADIATION DOSE REDUCTION: This exam was performed according to the departmental dose-optimization program which includes automated exposure control, adjustment of the mA and/or kV according to patient size and/or use of iterative reconstruction technique. 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 Hepatobiliary: No solid liver abnormality is seen. No gallstones, gallbladder wall thickening, or biliary dilatation. Pancreas: Unremarkable. No pancreatic ductal dilatation or surrounding inflammatory changes. Spleen: Normal in size without significant abnormality. Adrenals/Urinary Tract: Adrenal glands are unremarkable. Kidneys are normal, without renal calculi, solid lesion, or hydronephrosis. Bladder is unremarkable. Stomach/Bowel: Esophagogastric tube with tip and side port below the diaphragm. Stomach is within normal limits. Appendix appears normal. Status post sigmoid colon resection. Vascular/Lymphatic: Aortic atherosclerosis. No enlarged abdominal or pelvic lymph nodes. Reproductive: Status post hysterectomy. Other: Severe anasarca. Left lower quadrant end colostomy. Interval removal of a surgical drain previously seen in the low pelvis, with increased volume of frothy, loculated appearing air and fluid in the posterior pelvis adjacent to the rectal stump, collection measuring at least 8.9 x 8.1 x 6.7 cm (series 2, image 74, series 6, image 84). Musculoskeletal: No acute or significant osseous findings. IMPRESSION: 1. Interval removal of a surgical drain previously seen in the low pelvis, with increased volume of frothy, loculated appearing air and fluid in the posterior pelvis adjacent to the rectal stump, collection measuring at least 8.9 x 8.1 x 6.7 cm.  Findings are consistent with abscess. The stump may be assessed for leak via fluoroscopy if desired. 2. 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 3. Severe anasarca. 4. 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 Aortic Atherosclerosis (ICD10-I70.0). Electronically Signed   By: Delanna Ahmadi M.D.   On: 04/11/2022 13:55   [place] EKG SITE RITE  Result Date: 04/09/2022 If Site Rite image not attached, placement could not be confirmed due to current cardiac rhythm.  DG  Abd 1 View  Result Date: 04/08/2022 CLINICAL DATA:  Placement of NG tube EXAM: ABDOMEN - 1 VIEW COMPARISON:  Previous abdomen radiograph done on 04/08/2022, CT done on 04/06/2022 FINDINGS: There is interval placement of enteric tube with its tip in the region of body of stomach. There is dilation of small-bowel loops in mid abdomen. Lower abdomen and pelvis are not included in the image. There is blunting of right lateral CP angle. Increased interstitial markings are seen in right lower lung field and left parahilar region. IMPRESSION: Tip of enteric tube is seen in the stomach. Electronically Signed   By: Elmer Picker M.D.   On: 04/08/2022 15:33   DG Abd Portable 1V  Result Date: 04/08/2022 CLINICAL DATA:  Nausea and abdominal pain EXAM: PORTABLE ABDOMEN - 1 VIEW COMPARISON:  CT 04/06/2022 FINDINGS: Multiple dilated air-filled loops of small bowel seen throughout the abdomen measuring up to 4.7 cm in diameter moderate.) Similar to the prior x-ray. There is air in nondilated loops of colon and stomach. No obvious free air on this portable supine radiograph. Curvature and degenerative changes of the spine. IMPRESSION: Moderate air-filled dilatation of the small bowel diffusely. Again possible ileus. Recommend continued surveillance Electronically Signed   By: Jill Side M.D.   On: 04/08/2022 13:23   [place] EKG SITE RITE  Result Date: 04/07/2022 If Site Rite image not  attached, placement could not be confirmed due to current cardiac rhythm.  CT ABDOMEN PELVIS W CONTRAST  Result Date: 04/06/2022 CLINICAL DATA:  Postop pain, vomiting EXAM: CT ABDOMEN AND PELVIS WITH CONTRAST TECHNIQUE: Multidetector CT imaging of the abdomen and pelvis was performed using the standard protocol following bolus administration of intravenous contrast. RADIATION DOSE REDUCTION: This exam was performed according to the departmental dose-optimization program which includes automated exposure control, adjustment of the mA and/or kV according to patient size and/or use of iterative reconstruction technique. CONTRAST:  100 mL OMNIPAQUE IOHEXOL 300 MG/ML  SOLN COMPARISON:  03/29/2022 FINDINGS: Lower chest: Peripheral interstitial changes consistent with fibrosis. Minimal left-sided pleural effusion. Atheromatous calcifications of the aorta and coronary arteries. No pneumothorax or pericardial effusion identified. Hepatobiliary: Distended gallbladder. No hepatic parenchymal abnormalities. There is suggestion of mild intrahepatic biliary ductal dilatation. Pancreas: Atrophic appearance without evidence of focal lesions or Peripancreatic inflammatory changes. Spleen: Normal in size without focal abnormality. Adrenals/Urinary Tract: Adrenal glands are unremarkable. Kidneys are normal, without renal calculi, focal lesion, or hydronephrosis. Bladder is empty with a Foley catheter. Stomach/Bowel: Postop changes left-sided partial colectomy. Diverting ostomy left lower quadrant. Mild small bowel dilatation left mid to lower abdomen consistent with ileus. Vascular/Lymphatic: Aortic atherosclerosis. No enlarged abdominal or pelvic lymph nodes. Reproductive: Status post hysterectomy. No adnexal masses. Other: There is a drain identified with the tip in the posterior pelvis. Fluid collections with air consistent with abscesses that were present previously have diminished. There continues to be evidence of  extraluminal air and fluid in the pelvis presacral region. Findings consist of the original abscess measuring up to 5 cm maximum dimension without a well defined wall. Musculoskeletal: No acute or significant osseous findings. IMPRESSION: 1. Pelvic drain in place. Decreased but residual pelvic abscess. Pelvic collection measuring up to 5 cm without well-defined wall. 2. Changes consistent with ileus. 3. Status post sigmoid resection with diverting ostomy. 4. Distended gallbladder and mild intrahepatic ductal dilatation. 5. Small left-sided pleural effusion with evidence of pulmonary interstitial fibrosis. Electronically Signed   By: Sammie Bench M.D.   On: 04/06/2022 12:09  DG Abd Portable 1V  Result Date: 04/05/2022 CLINICAL DATA:  Ileus following gastrointestinal surgery. EXAM: PORTABLE ABDOMEN - 1 VIEW COMPARISON:  One-view abdomen 04/03/2022 FINDINGS: Gas-filled loops of small bowel are present in the mid left abdomen without significant dilation. Gas is present within the colon. Side port of the NG tube is in the proximal stomach. Mild leftward curvature of the thoracolumbar spine is stable. IMPRESSION: Gas-filled loops of small bowel in the mid left abdomen without significant dilation. Findings are compatible with ileus. Electronically Signed   By: San Morelle M.D.   On: 04/05/2022 12:44   [place] EKG SITE RITE  Result Date: 04/04/2022 If Site Rite image not attached, placement could not be confirmed due to current cardiac rhythm.  DG Abd Portable 1V  Result Date: 04/03/2022 CLINICAL DATA:  77 year old female NG tube placement. EXAM: PORTABLE ABDOMEN - 1 VIEW COMPARISON:  0831 hours today and earlier. FINDINGS: Portable AP semi upright view at 1102 hours. Enteric tube placed into the stomach, left upper quadrant. Side hole projects at the level of the gastric body. Negative visible bowel gas pattern. Levoconvex thoracolumbar scoliosis. Negative lung bases. IMPRESSION: Satisfactory  enteric tube placement into the stomach. Electronically Signed   By: Genevie Ann M.D.   On: 04/03/2022 11:10   DG Abd Portable 1V  Result Date: 04/03/2022 CLINICAL DATA:  Ileus following gastrointestinal surgery EXAM: PORTABLE ABDOMEN - 1 VIEW COMPARISON:  Portable exam 0831 hours compared to 01/26/2022 FINDINGS: Surgical drain in pelvis. Nonobstructive bowel gas pattern. No bowel dilatation or bowel wall thickening. Subcutaneous soft tissue gas within the abdominal wall bilaterally consistent with surgery. Ostomy LEFT lower quadrant. Bones demineralized with degenerative changes thoracolumbar spine. IMPRESSION: Nonobstructive bowel gas pattern. Electronically Signed   By: Lavonia Dana M.D.   On: 04/03/2022 09:06   MR PELVIS WO CM RECTAL CA STAGING  Result Date: 03/31/2022 CLINICAL DATA:  Possible rectal mass on recent CT.  Diverticulitis. EXAM: MRI PELVIS WITHOUT CONTRAST TECHNIQUE: Multiplanar multisequence MR imaging of the pelvis was performed. No intravenous contrast was administered. COMPARISON:  CT on 03/20/2022 FINDINGS: Lower Urinary Tract: Unremarkable urinary bladder. Bowel: Severe diverticulosis is seen involving the sigmoid colon. Concentric wall thickening is seen involving the distal sigmoid colon and upper rectum, measuring approximately 8 cm in length. Adjacent multiloculated fluid collection is seen in the left perirectal and presacral region, which measures 3.6 x 3.0 cm. These findings favor diverticulitis with abscess, with perforated rectal carcinoma considered less likely. Vascular/Lymphatic: Multiple small less than 1 cm left perirectal lymph nodes are seen. No other sites of lymphadenopathy identified. Reproductive: Prior hysterectomy noted. Adnexal regions are unremarkable in appearance. Other: None. Musculoskeletal: No suspicious bone lesions identified. IMPRESSION: Severe sigmoid diverticulosis. Concentric wall thickening involving the distal sigmoid colon and upper rectum, with  adjacent 3.6 cm multiloculated fluid collection in the left perirectal and presacral region. These findings favor diverticulitis and diverticular abscess, with perforated rectal carcinoma considered less likely although this cannot be excluded. Recommend clinical correlation and consider continued short-term follow-up imaging with CT. Multiple sub-cm left perirectal lymph nodes, which are nonspecific. These may be reactive in etiology, although metastatic disease cannot be excluded in the setting of rectal carcinoma. Electronically Signed   By: Marlaine Hind M.D.   On: 03/31/2022 14:46   CT ABDOMEN PELVIS W CONTRAST  Result Date: 03/29/2022 CLINICAL DATA:  Constipation. Clinical suspicion for diverticulitis. EXAM: CT ABDOMEN AND PELVIS WITH CONTRAST TECHNIQUE: Multidetector CT imaging of the abdomen and pelvis was performed  using the standard protocol following bolus administration of intravenous contrast. RADIATION DOSE REDUCTION: This exam was performed according to the departmental dose-optimization program which includes automated exposure control, adjustment of the mA and/or kV according to patient size and/or use of iterative reconstruction technique. CONTRAST:  168m OMNIPAQUE IOHEXOL 300 MG/ML  SOLN COMPARISON:  03/20/2022.  03/13/2022. FINDINGS: Lower chest: Chronic fibrotic changes at the lung bases, stable. No acute findings. Hepatobiliary: No focal liver abnormality is seen. No gallstones, gallbladder wall thickening, or biliary dilatation. Pancreas: Pancreatic atrophy.  No mass.  No inflammation. Spleen: Normal in size without focal abnormality. Adrenals/Urinary Tract: Adrenal glands are unremarkable. Kidneys are normal, without renal calculi, focal lesion, or hydronephrosis. Bladder is unremarkable. Stomach/Bowel: Wall thickening of the distal sigmoid colon. There is adjacent inflammatory change. An irregular extraluminal fluid collection, containing non dependent air, tracks along the lower  sigmoid colon to the level of the rectosigmoid junction, measuring approximately 6 cm from superior to inferior by 2.6 x 2.6 cm transversely. There are numerous diverticula along the remainder of the sigmoid colon with no other areas of wall thickening or inflammation. Remainder of the colon is normal in caliber with no evidence of inflammation. Normal stomach and small bowel. Normal appendix. Vascular/Lymphatic: Aortic atherosclerosis. No aneurysm. No enlarged lymph nodes. Reproductive: Status post hysterectomy.  No pelvic/adnexal masses. Other: No hernia.  No ascites.  No free air. Musculoskeletal: No fracture or acute finding.  No bone lesion. IMPRESSION: 1. Current findings are consistent with complicated sigmoid diverticulitis. Distal sigmoid colon shows wall thickening and adjacent inflammation. There is an adjacent irregular fluid collection consistent with a peridiverticular abscess in the posteroinferior pelvis that measures 6 x 2.6 x 2.6 cm. Inflammatory changes have become more evident compared to the prior CTs. No bowel obstruction. 2. No other acute abnormality.  No free air. Electronically Signed   By: DLajean ManesM.D.   On: 03/29/2022 11:32   CT ABDOMEN PELVIS W CONTRAST  Result Date: 03/20/2022 CLINICAL DATA:  Recent syncopal episode while on the toilet with sharp abdominal pain, initial encounter EXAM: CT ABDOMEN AND PELVIS WITH CONTRAST TECHNIQUE: Multidetector CT imaging of the abdomen and pelvis was performed using the standard protocol following bolus administration of intravenous contrast. RADIATION DOSE REDUCTION: This exam was performed according to the departmental dose-optimization program which includes automated exposure control, adjustment of the mA and/or kV according to patient size and/or use of iterative reconstruction technique. CONTRAST:  1037mOMNIPAQUE IOHEXOL 300 MG/ML  SOLN COMPARISON:  03/13/2022 FINDINGS: Lower chest: Subpleural fibrosis is noted in the bases  bilaterally stable from. Hepatobiliary: Gallbladder is well distended. No evidence of cholelithiasis. The liver is within normal limits. Pancreas: Pancreas is atrophic. Spleen: Normal in size without focal abnormality. Adrenals/Urinary Tract: Adrenal glands are within normal limits. Kidneys demonstrate a normal enhancement pattern bilaterally. No renal calculi are seen. Normal excretion is noted on delayed images. Bladder is within normal limits. Stomach/Bowel: There is again noted an enhancing mass lesion in the rectum near the the sigmoid junction. Diverticular change the colon is noted. Fluid is noted throughout the colon consistent with the recent colonoscopy prep. The appendix is within normal limits. Small bowel and stomach are unremarkable. Vascular/Lymphatic: Aortic atherosclerosis. Scattered small lymph nodes are noted adjacent to the rectal mass in the pelvis. These measure less than 1 cm in dimension. Reproductive: Status post hysterectomy. No adnexal masses. Other: No free fluid is noted. Musculoskeletal: Degenerative changes of lumbar spine are noted. IMPRESSION: Enhancing mass lesion just  below the rectosigmoid junction which corresponds with that seen on prior CT examination. Considerable fluid is noted throughout the colon consistent with the recent colonoscopy prep Diverticulosis without diverticulitis. No other focal abnormality is noted. Electronically Signed   By: Inez Catalina M.D.   On: 03/20/2022 01:04   CT ABDOMEN PELVIS WO CONTRAST  Result Date: 03/13/2022 CLINICAL DATA:  Abdominal/flank pain. EXAM: CT ABDOMEN AND PELVIS WITHOUT CONTRAST TECHNIQUE: Multidetector CT imaging of the abdomen and pelvis was performed following the standard protocol without IV contrast. RADIATION DOSE REDUCTION: This exam was performed according to the departmental dose-optimization program which includes automated exposure control, adjustment of the mA and/or kV according to patient size and/or use of  iterative reconstruction technique. COMPARISON:  11/08/2019 FINDINGS: Lower chest: None the heart size appears mildly enlarged. There are small bilateral pleural effusions noted. Diffuse bilateral peripheral predominant interstitial reticulation identified, likely reflecting chronic interstitial lung disease. Hepatobiliary: No focal liver abnormality is seen. No gallstones, gallbladder wall thickening, or biliary dilatation. Pancreas: Unremarkable. No pancreatic ductal dilatation or surrounding inflammatory changes. Spleen: Normal in size without focal abnormality. Adrenals/Urinary Tract: Normal appearance of the adrenal glands. No nephrolithiasis, hydronephrosis or kidney mass. Urinary bladder appears normal for degree of distension. Stomach/Bowel: Stomach appears within normal limits. The appendix is visualized and appears normal. Sigmoid diverticulosis without signs of acute diverticulitis. There is a mass involving the proximal rectum with diffuse circumferential wall thickening and luminal narrowing. Surrounding soft tissue infiltration into the perirectal fat is identified, image 31/5. This measures 3.7 by 3.4 by 6.2 cm and is concerning for underlying colonic neoplasm. Perirectal adenopathy is identified. The largest lymph node is along the left lateral wall of the rectum measuring 1.6 cm, image 53/2. Vascular/Lymphatic: Aortic atherosclerosis without aneurysm. Aortocaval lymph node is prominent measuring 0.9 cm, image 33/2. Reproductive: Status post hysterectomy. No adnexal masses. Other: There is presacral soft tissue stranding and soft tissue stranding surrounding the rectal mass. No discrete fluid collections. No signs of pneumoperitoneum. Musculoskeletal: No acute or significant osseous findings. IMPRESSION: 1. There is a mass involving the proximal rectum with diffuse circumferential wall thickening and luminal narrowing. Surrounding soft tissue infiltration into the perirectal fat is identified.  Perirectal adenopathy is identified. 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 Further evaluation with direct visualization and tissue sampling is advised. 2. No signs of bowel obstruction at this time. 3. Sigmoid diverticulosis without signs of acute diverticulitis. 4. No signs of nephrolithiasis or obstructive uropathy. 5. Small bilateral pleural effusions. 6. Signs of chronic interstitial lung disease are again noted. 7.  Aortic Atherosclerosis (ICD10-I70.0). Electronically Signed   By: Kerby Moors M.D.   On: 03/13/2022 16:09    Labs:  CBC: Recent Labs    04/07/22 1041 04/09/22 0450 04/10/22 1030 04/11/22 0411 04/11/22 1253  WBC 11.2* 10.4 11.9* 11.7*  --   HGB 9.3* 9.1* 8.2* 6.8* 8.2*  HCT 30.5* 28.6* 25.9* 22.0* 25.5*  PLT 339 405* 422* 411*  --     COAGS: Recent Labs    03/30/22 0129  INR 1.2    BMP: Recent Labs    04/09/22 0450 04/10/22 0221 04/11/22 0411 04/12/22 0302  NA 135 137 138 138  K 3.8 3.4* 3.6 3.8  CL 103 103 106 107  CO2 '26 27 27 26  '$ GLUCOSE 97 160* 125* 110*  BUN 6* '10 10 12  '$ CALCIUM 8.0* 7.6* 7.5* 7.7*  CREATININE 0.83 0.77 0.57 0.54  GFRNONAA >60 >60 >60 >60    LIVER  FUNCTION TESTS: Recent Labs    04/04/22 0611 04/05/22 0208 04/06/22 0207 04/10/22 0221  BILITOT 0.4 0.6 0.4 0.3  AST '19 20 15 16  '$ ALT '11 13 11 11  '$ ALKPHOS 44 46 47 53  PROT 4.5* 5.2* 5.6* 5.7*  ALBUMIN <1.5* 1.8* 2.0* 1.9*   Assessment and Plan:  Sigmoid diverticulitis -12 days s/p resectioin c/b recurrent abscess -Labs, imaging, meds, and history reviewed -case reviewed by Dr. Maryelizabeth Kaufmann who approves transgluteal pelvic drain placement -Pt is appropriately NPO. -Will proceed with drain placement followed by routine drain care.  Thank you for this interesting consult.  I greatly enjoyed meeting Alexandria [name] and look forward to participating in their care.  A copy of this report was sent to the requesting provider on this date.  Electronically  Signed: Pasty Spillers, PA 04/12/2022, 8:35 AM   I spent a total of 40 Minutes in face to face in clinical consultation, greater than 50% of which was counseling/coordinating care for pelvic abscess.

## 2022-04-12 NOTE — Procedures (Signed)
Vascular and Interventional Radiology Procedure Note  Patient: Alexandria [name] DOB: 1945/08/10 Medical Record Number: [id number] Note Date/Time: 04/12/22 1:19 PM   Performing Physician: Michaelle Birks, MD Assistant(s): None  Diagnosis: Pelvic abscess, concern for anastomotic breakdown  Procedure: DRAINAGE CATHETER PLACEMENT into PELVIC ABCESS  Anesthesia: Conscious Sedation Complications: None Estimated Blood Loss: Minimal Specimens: Sent for Gram Stain, Aerobe Culture, and Anerobe Culture  Findings:  Successful CT-guided transgluteal approach placement of 12 F catheter into pelvic abscess.  Plan:  - Flush drain with 5 mL Normal Saline every 8 hours. - Follow up drain evaluation / sinogram in 2 week(s).  See detailed procedure note with images in PACS. The patient tolerated the procedure well without incident or complication and was returned to ICU in stable condition.    Michaelle Birks, MD Vascular and Interventional Radiology Specialists [facility] Radiology   Pager. [place]

## 2022-04-13 ENCOUNTER — Telehealth: Payer: Self-pay | Admitting: Family Medicine

## 2022-04-13 DIAGNOSIS — K5730 Diverticulosis of large intestine without perforation or abscess without bleeding: Secondary | ICD-10-CM | POA: Diagnosis not present

## 2022-04-13 LAB — COMPREHENSIVE METABOLIC PANEL
ALT: 20 U/L (ref 0–44)
AST: 31 U/L (ref 15–41)
Albumin: 1.7 g/dL — ABNORMAL LOW (ref 3.5–5.0)
Alkaline Phosphatase: 52 U/L (ref 38–126)
Anion gap: 7 (ref 5–15)
BUN: 13 mg/dL (ref 8–23)
CO2: 24 mmol/L (ref 22–32)
Calcium: 7.8 mg/dL — ABNORMAL LOW (ref 8.9–10.3)
Chloride: 105 mmol/L (ref 98–111)
Creatinine, Ser: 0.55 mg/dL (ref 0.44–1.00)
GFR, Estimated: >60 mL/min (ref >60)
Glucose, Bld: 109 mg/dL — ABNORMAL HIGH (ref 70–99)
Potassium: 3.9 mmol/L (ref 3.5–5.1)
Sodium: 136 mmol/L (ref 135–145)
Total Bilirubin: 0.5 mg/dL (ref 0.3–1.2)
Total Protein: 5.4 g/dL — ABNORMAL LOW (ref 6.5–8.1)

## 2022-04-13 LAB — PHOSPHORUS: Phosphorus: 3.2 mg/dL (ref 2.5–4.6)

## 2022-04-13 LAB — HEMOGLOBIN AND HEMATOCRIT, BLOOD
HCT: 28.3 % — ABNORMAL LOW (ref 36.0–46.0)
Hemoglobin: 9.3 g/dL — ABNORMAL LOW (ref 12.0–15.0)

## 2022-04-13 LAB — GLUCOSE, CAPILLARY
Glucose-Capillary: 121 mg/dL — ABNORMAL HIGH (ref 70–99)
Glucose-Capillary: 127 mg/dL — ABNORMAL HIGH (ref 70–99)
Glucose-Capillary: 112 mg/dL — ABNORMAL HIGH (ref 70–99)
Glucose-Capillary: 121 mg/dL — ABNORMAL HIGH (ref 70–99)

## 2022-04-13 LAB — CBC
HCT: 21.2 % — ABNORMAL LOW (ref 36.0–46.0)
Hemoglobin: 6.5 g/dL — CL (ref 12.0–15.0)
MCH: 32.3 pg (ref 26.0–34.0)
MCHC: 30.7 g/dL (ref 30.0–36.0)
MCV: 105.5 fL — ABNORMAL HIGH (ref 80.0–100.0)
Platelets: 363 10*3/uL (ref 150–400)
RBC: 2.01 MIL/uL — ABNORMAL LOW (ref 3.87–5.11)
RDW: 17.2 % — ABNORMAL HIGH (ref 11.5–15.5)
WBC: 10.9 10*3/uL — ABNORMAL HIGH (ref 4.0–10.5)
nRBC: 0.3 % — ABNORMAL HIGH (ref 0.0–0.2)

## 2022-04-13 LAB — PREPARE RBC (CROSSMATCH)

## 2022-04-13 LAB — TRIGLYCERIDES: Triglycerides: 60 mg/dL (ref <150)

## 2022-04-13 LAB — MAGNESIUM: Magnesium: 1.9 mg/dL (ref 1.7–2.4)

## 2022-04-13 MED ORDER — SODIUM CHLORIDE 0.9% IV SOLUTION
Freq: Once | INTRAVENOUS | Status: AC
  Administered 2022-04-13: 250 mL via INTRAVENOUS

## 2022-04-13 MED ORDER — FENTANYL 12 MCG/HR TD PT72
1.0000 | MEDICATED_PATCH | TRANSDERMAL | Status: DC
  Administered 2022-04-13 – 2022-04-16 (×2): 1 via TRANSDERMAL
  Filled 2022-04-13 (×2): qty 1

## 2022-04-13 MED ORDER — SODIUM CHLORIDE 0.9% FLUSH
5.0000 mL | Freq: Three times a day (TID) | INTRAVENOUS | Status: DC
  Administered 2022-04-13 – 2022-04-19 (×18): 5 mL
  Administered 2022-04-19: 10 mL
  Administered 2022-04-19 – 2022-04-21 (×6): 5 mL

## 2022-04-13 MED ORDER — TRAVASOL 10 % IV SOLN
INTRAVENOUS | Status: AC
  Filled 2022-04-13: qty 900

## 2022-04-13 NOTE — Consult Note (Signed)
[place] Nurse ostomy follow up Stoma type/location: LLQ, colostomy Stomal assessment/size: did not assess, patient not able to engage, not opening eyes, still has NG Peristomal assessment: NA Treatment options for stomal/peristomal skin: barrier ring Output brown; mushy Ostomy pouching: 1pc.soft convex Education provided: not able to participate or engage for learning today Pouch intact from 04/10/22 change, notified patient I would come tomorrow to change pouch Enrolled patient in [place] Start Discharge program: Yes  Supplies in the room in the over sink cabinet [place] Nurse will follow along with you for continued support with ostomy teaching and care [place] MSN, [place], [place], Anon Raices, [place]

## 2022-04-13 NOTE — Progress Notes (Addendum)
PT Cancellation Note  Patient Details Name: Alexandria [name] MRN: [id number] DOB: [birth year]   Cancelled Treatment:     Dizziness with OT.  Critical lab with low hgb, PRBCs ordered in progress notes.  Will check back as schedule permits.   [name] 04/13/2022, 2:25 PM Arlyce Dice, DPT Physical Therapist Acute Rehabilitation Services Preferred contact method: Secure Chat Weekend Pager Only: [phone] Office: [phone]

## 2022-04-13 NOTE — Progress Notes (Signed)
Occupational Therapy Treatment Patient Details Name: Alexandria [name] MRN: [id number] DOB: April 30, 1945 Today's Date: 04/13/2022   History of present illness 77 y/o female presented with continued constipation and diffuse abdominal pain, worst in the LUQ. Now s/p robotic lower anterior rectosigmoid resection with colostomy and drainage of pelvic abscess 03/31/22.  Plan for transgluteal pelvic drain placement on 04/12/22.  PHMx:hypothyroidism, grade I DD, HTN, IPF, diverticulosis, diverticulitis, DVT, chronic hypokalemia   OT comments  Patient was able to participate in toileting tasks and minimal grooming standing at sink in bathroom with onset of dizziness. Patient returned to bed with stable vitals and nurse made aware. Patient was quick to fatigue and noted to have some fixation over NG clamping. Patient's discharge plan remains appropriate at this time. OT will continue to follow acutely.     Recommendations for follow up therapy are one component of a multi-disciplinary discharge planning process, led by the attending physician.  Recommendations may be updated based on patient status, additional functional criteria and insurance authorization.    Follow Up Recommendations  No OT follow up     Assistance Recommended at Discharge PRN  Patient can return home with the following  Assistance with cooking/housework;Direct supervision/assist for medications management;Direct supervision/assist for financial management;Assist for transportation   Equipment Recommendations  None recommended by OT    Recommendations for Other Services      Precautions / Restrictions Precautions Precautions: Other (comment) Precaution Comments: abdominal surgery, NGT, JP drain on L side Restrictions Weight Bearing Restrictions: No       Mobility Bed Mobility Overal bed mobility: Needs Assistance Bed Mobility: Supine to Sit, Sit to Supine Rolling: Supervision Sidelying to sit: Min assist Supine to  sit: Min assist     General bed mobility comments: with cues for maintain precautions.           ADL either performed or assessed with clinical judgement   ADL Overall ADL's : Needs assistance/impaired     Grooming: Min guard;Standing Grooming Details (indicate cue type and reason): at sink. patient was able to participate in task for a minute then reported feeling dizzy. patient then transitioned to bed with BLE elevated BP was 150/76 mmhg, HR was 80 bpm and O2 was 98% on RA.  patient declined to further participate in session. nurse made aware.                 Toilet Transfer: Conservator, museum/gallery and Hygiene: Min guard;Sit to/from stand                Cognition Arousal/Alertness: Awake/alert Behavior During Therapy: WFL for tasks assessed/performed               General Comments: noted to have minimal confusion during session with fixation on NG tube at times.appropiate with cues and directions during session.                   Pertinent Vitals/ Pain       Pain Assessment Pain Assessment: Faces Faces Pain Scale: Hurts little more Pain Location: abdomen Pain Descriptors / Indicators: Sore Pain Intervention(s): Limited activity within patient's tolerance, Monitored during session, Premedicated before session, Repositioned   Frequency  Min 2X/week        Progress Toward Goals  OT Goals(current goals can now be found in the care plan section)  Progress towards OT goals: Progressing toward goals     Plan Discharge plan remains appropriate  AM-PAC OT "6 Clicks" Daily Activity     Outcome Measure   Help from another person eating meals?: None Help from another person taking care of personal grooming?: A Little Help from another person toileting, which includes using toliet, bedpan, or urinal?: A Little Help from another person bathing (including washing, rinsing, drying)?: A Little Help from another person  to put on and taking off regular upper body clothing?: A Little Help from another person to put on and taking off regular lower body clothing?: A Little 6 Click Score: 19    End of Session    OT Visit Diagnosis: Unsteadiness on feet (R26.81);Other abnormalities of gait and mobility (R26.89);Muscle weakness (generalized) (M62.81)   Activity Tolerance Patient tolerated treatment well   Patient Left with call bell/phone within reach;in bed;with bed alarm set;with family/visitor present   Nurse Communication Other (comment) (patients dizziness during session)        Time: 6979-4801 OT Time Calculation (min): 26 min  Charges: OT General Charges $OT Visit: 1 Visit OT Treatments $Self Care/Home Management : 23-37 mins  Rennie Plowman, MS Acute Rehabilitation Department Office# [phone]   Willa Rough 04/13/2022, 11:29 AM

## 2022-04-13 NOTE — Progress Notes (Signed)
Pt tolerated NG clamping today without any residual or nausea concerns. Pain has been well controlled with the addition of Fentanyl patch.   At 1800 pt began complaining of intractable pain. Called out constantly for pain meds even after administration of PRN fentanyl. NG was hooked up to LIWS at this time with no output. JP with minimal output. Ostomy with minimal output. PRN ativan given.

## 2022-04-13 NOTE — Progress Notes (Signed)
Lab called critical value at 1218. Hgb: 6.4. Provider  Sloan Leiter, MD notified. Ordered 2 units transfused. Will monitor closely.

## 2022-04-13 NOTE — Progress Notes (Signed)
PHARMACY - TOTAL PARENTERAL NUTRITION CONSULT NOTE   Indication: Prolonged ileus  Patient Measurements: Height: '5\' 4"'$  (162.6 cm) Weight: 70.1 kg (154 lb 8.7 oz) IBW/kg (Calculated) : 54.7 TPN AdjBW (KG): 63 Body mass index is 26.53 kg/m. Usual Weight:   Assessment:  Pharmacy is consulted to dose TPN in 77 yo female with prolonged ileus. Pt  is s/p robotic lower anterior rectosigmoid resection with colostomy and drainage of pelvic abscess by Dr. [name] 12/26.   Glucose / Insulin:  No history of DM, A1c 5.4 (03/18/20).  - CBGs: at goal < 150 - sSSI:  used 2 units / 24 hrs Electrolytes: WNL including CorrCa Renal: SCr < 1, BUN WNL Hepatic: LFT, alk phos, Tbili WNL  - albumin low at 1.7 - TG 60 (04/13/22)  Intake / Output; MIVF: Not completely recorded; 1/6 CT shows anasarca - 0.9%NaCl @ 10 ml/hr, PRBC transfusion 04/11/22 - Output:  Urine x17 unmeasured, no stools, NG 150 mL, drain 53 mL - Bloody rectal secretions reported  GI Imaging: - 34/74: complicated sigmoid diverticulitis. Distal sigmoid colon shows wall thickening and adjacent inflammation. - 1/1 CT: decreasing residual pelvic abscess, ileus, ostomy, distended gallbladder, small left pleural effusion w/ pulm fibrosis - 1/6 CT:  pelvic abscess, concern for stump leak  GI Surgeries / Procedures:  - 12/26: robotic lower anterior rectosigmoid resection with colostomy and drainage of pelvic abscess  - 1/7 IR transgluteal pelvic drain placement  Central access:   1/4: Replaced PICC TPN start date:  12/30-1/2, restarted 1/4   Nutritional Goals: Goal TPN rate is 75 mL/hr (provides 90 g of protein and 1818 kcals per day)  RD Assessment: Estimated Needs Total Energy Estimated Needs: 1700-1900 kcals Total Protein Estimated Needs: 85-100 g Total Fluid Estimated Needs: >/= 1.7 L  Current Nutrition:  NPO and TPN  Plan:  Continue TPN at 75 mL/hr, goal rate  Electrolytes in TPN:  Na 140mq/L  K 623m/L  Ca 29m729mL  Mg 5  mEq/L (increase) Phos 25 mmol/L  Cl:Ac 1:1  Continue standard MVI and trace elements in TPN Continue CBGs and SSI q 6hrs  mIVF: 0.9% NaCl @ 10 ml/hr TPN lab panel Mon/Thurs and prn   EriPeggyann JubaharmD, BCPS [facility] main pharmacy [phone]/2024 7:03 AM

## 2022-04-13 NOTE — Progress Notes (Signed)
Referring Physician(s): Toth,P  Supervising Physician: [name]  Patient Status:  [name] - In-pt  Chief Complaint:  Pelvic/back pain, nausea, pelvic fluid collection  Subjective: Pt with c/o soreness at "SI joint" and TG drain site, occ nausea; afebrile; bloody output from pelvic drain; hx rectal bleeding noted   Allergies: Prednisone, Dilaudid [hydromorphone], Atrovent hfa [ipratropium bromide hfa], Cheratussin ac [guaifenesin-codeine], Diltiazem, Hydrocodone, Influenza vaccines, Morphine, Motrin [ibuprofen], Oxycodone, and Codeine  Medications: Prior to Admission medications   Medication Sig Start Date End Date Taking? Authorizing Provider  aspirin EC 81 MG tablet Take 81 mg by mouth daily. Swallow whole.   Yes [provider]  benzonatate (TESSALON) 100 MG capsule Take 200 mg by mouth as needed for cough. 03/06/22  Yes [provider]  furosemide (LASIX) 40 MG tablet TAKE 1 TABLET BY MOUTH TWICE A DAY Patient taking differently: Take 40 mg by mouth 2 (two) times daily. 09/03/21  Yes Lowne Chase, Yvonne R, DO  KLOR-CON M20 20 MEQ tablet Take 1 tablet (20 mEq total) by mouth 2 (two) times daily. Will take 3 a daily only as needed. Patient taking differently: Take 20 mEq by mouth 2 (two) times daily. 01/06/22  Yes [name], DO  linaclotide (LINZESS) 145 MCG CAPS capsule Take 1 capsule (145 mcg total) by mouth daily before breakfast. 03/17/22  Yes Sharyn Creamer, MD  Nintedanib (OFEV) 100 MG CAPS Take 100 mg by mouth in the morning and at bedtime. 07/03/20  Yes [provider]  ondansetron (ZOFRAN-ODT) 4 MG disintegrating tablet TAKE 1 TABLET BY MOUTH EVERY 8 HOURS AS NEEDED FOR NAUSEA AND VOMITING Patient taking differently: Take 4 mg by mouth as needed for nausea or vomiting. 07/18/21  Yes [name], DO  SYNTHROID 112 MCG tablet Take 1 tablet (112 mcg total) by mouth daily before breakfast. 03/02/22  Yes Roma Schanz R,  DO  vitamin C (ASCORBIC ACID) 500 MG tablet Take 500 mg by mouth daily.   Yes [provider]  AMBULATORY NON FORMULARY MEDICATION Medication Name: Diltiazem 2% with Lidocaine 5%- use index finger, apply small amount of medication inside the rectum up to your first knuckle/joint for 8 weeks Patient not taking: Reported on 03/29/2022 03/20/22   Sharyn Creamer, MD     Vital Signs: BP 138/65 (BP Location: Right Arm)   Pulse 79   Temp 98.3 F (36.8 C) (Oral)   Resp 18   Ht '5\' 4"'$  (1.626 m)   Wt 154 lb 8.7 oz (70.1 kg)   SpO2 96%   BMI 26.53 kg/m   Physical Exam awake/alert; left TG drain intact, stopcock removed, drain flushed with return of bloody fluid; OP 50 cc  Imaging: CT GUIDED PERITONEAL/RETROPERITONEAL FLUID DRAIN BY PERC CATH  Result Date: 04/12/2022 INDICATION: Prior sigmoid resection. Pelvic abscess suspicious for anastomotic breakdown. EXAM: CT-GUIDED LEFT TRANSGLUTEAL DRAIN PLACEMENT FOR PELVIC ABCESS COMPARISON:  CT AP, 04/11/2022 and 04/06/2022. MEDICATIONS: The patient is currently admitted to the hospital and receiving intravenous antibiotics. The antibiotics were administered within an appropriate time frame prior to the initiation of the procedure. ANESTHESIA/SEDATION: Moderate (conscious) sedation was employed during this procedure. A total of Versed 1.5 mg and Fentanyl 75 mcg was administered intravenously. Moderate Sedation Time: 15 minutes. The patient's level of consciousness and vital signs were monitored continuously by radiology nursing throughout the procedure under my direct supervision. CONTRAST:  None COMPLICATIONS: None immediate. PROCEDURE: RADIATION DOSE REDUCTION: This exam was performed according  to the departmental dose-optimization program which includes automated exposure control, adjustment of the mA and/or kV according to patient size and/or use of iterative reconstruction technique. Informed written consent was obtained from the patient and/or  patient's representative after a discussion of the risks, benefits and alternatives to treatment. The patient was placed prone on the CT gantry and a pre procedural CT was performed re-demonstrating the known abscess/fluid collection within the deep pelvis. The procedure was planned. A timeout was performed prior to the initiation of the procedure. The LEFT gluteus was prepped and draped in the usual sterile fashion. The overlying soft tissues were anesthetized with 1% lidocaine with epinephrine. Appropriate trajectory was planned with the use of a 22 gauge spinal needle. An 18 gauge trocar needle was advanced into the abscess/fluid collection and a short Amplatz super stiff wire was coiled within the collection. Appropriate positioning was confirmed with a limited CT scan. The tract was serially dilated allowing placement of a 12 Fr drainage catheter. Appropriate positioning was confirmed with a limited postprocedural CT scan. 5 ml of serosanguineous fluid was aspirated. The tube was connected to a drainage bag and sutured in place. A dressing was placed. The patient tolerated the procedure well without immediate post procedural complication. IMPRESSION: Successful CT guided placement of a 12 Fr drainage catheter via LEFT transgluteal approach into the pelvic abscess. Samples were sent to the laboratory as requested by the ordering clinical team. Michaelle Birks, MD Vascular and Interventional Radiology Specialists [facility] Radiology Electronically Signed   By: Michaelle Birks M.D.   On: 04/12/2022 15:14   CT ABDOMEN PELVIS WO CONTRAST  Result Date: 04/11/2022 CLINICAL DATA:  Postoperative abdominal pain EXAM: CT ABDOMEN AND PELVIS WITHOUT CONTRAST TECHNIQUE: Multidetector CT imaging of the abdomen and pelvis was performed following the standard protocol without IV contrast. RADIATION DOSE REDUCTION: This exam was performed according to the departmental dose-optimization program which includes automated exposure  control, adjustment of the mA and/or kV according to patient size and/or use of iterative reconstruction technique. 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 Hepatobiliary: No solid liver abnormality is seen. No gallstones, gallbladder wall thickening, or biliary dilatation. Pancreas: Unremarkable. No pancreatic ductal dilatation or surrounding inflammatory changes. Spleen: Normal in size without significant abnormality. Adrenals/Urinary Tract: Adrenal glands are unremarkable. Kidneys are normal, without renal calculi, solid lesion, or hydronephrosis. Bladder is unremarkable. Stomach/Bowel: Esophagogastric tube with tip and side port below the diaphragm. Stomach is within normal limits. Appendix appears normal. Status post sigmoid colon resection. Vascular/Lymphatic: Aortic atherosclerosis. No enlarged abdominal or pelvic lymph nodes. Reproductive: Status post hysterectomy. Other: Severe anasarca. Left lower quadrant end colostomy. Interval removal of a surgical drain previously seen in the low pelvis, with increased volume of frothy, loculated appearing air and fluid in the posterior pelvis adjacent to the rectal stump, collection measuring at least 8.9 x 8.1 x 6.7 cm (series 2, image 74, series 6, image 84). Musculoskeletal: No acute or significant osseous findings. IMPRESSION: 1. Interval removal of a surgical drain previously seen in the low pelvis, with increased volume of frothy, loculated appearing air and fluid in the posterior pelvis adjacent to the rectal stump, collection measuring at least 8.9 x 8.1 x 6.7 cm. Findings are consistent with abscess. The stump may be assessed for leak via fluoroscopy if desired. 2. 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 3. Severe anasarca. 4. 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 Aortic  Atherosclerosis (ICD10-I70.0). Electronically Signed   By: Delanna Ahmadi M.D.   On: 04/11/2022 13:55    Labs:  CBC: Recent Labs    04/07/22 1041 04/09/22 0450 04/10/22 1030 04/11/22 0411 04/11/22 1253  WBC 11.2* 10.4 11.9* 11.7*  --   HGB 9.3* 9.1* 8.2* 6.8* 8.2*  HCT 30.5* 28.6* 25.9* 22.0* 25.5*  PLT 339 405* 422* 411*  --     COAGS: Recent Labs    03/30/22 0129  INR 1.2    BMP: Recent Labs    04/10/22 0221 04/11/22 0411 04/12/22 0302 04/13/22 0229  NA 137 138 138 136  K 3.4* 3.6 3.8 3.9  CL 103 106 107 105  CO2 '27 27 26 24  '$ GLUCOSE 160* 125* 110* 109*  BUN '10 10 12 13  '$ CALCIUM 7.6* 7.5* 7.7* 7.8*  CREATININE 0.77 0.57 0.54 0.55  GFRNONAA >60 >60 >60 >60    LIVER FUNCTION TESTS: Recent Labs    04/05/22 0208 04/06/22 0207 04/10/22 0221 04/13/22 0229  BILITOT 0.6 0.4 0.3 0.5  AST '20 15 16 31  '$ ALT '13 11 11 20  '$ ALKPHOS 46 47 53 52  PROT 5.2* 5.6* 5.7* 5.4*  ALBUMIN 1.8* 2.0* 1.9* 1.7*    Assessment and Plan:  POD#13 s/p robotic lower anterior rectosigmoid resection with colostomy and drainage of pelvic abscess by Dr. 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 Rowe Robert, PA-C 04/13/2022, 12:12 PM   I spent a total of 15 Minutes at the the patient's bedside AND on the patient's hospital floor or unit, greater than 50% of which was  counseling/coordinating care for pelvic fluid collection drain   Patient ID: Alexandria [name], female   DOB: [birth year], 77 y.o.   MRN: [id number]

## 2022-04-13 NOTE — Telephone Encounter (Signed)
Patient called from hospital to advise that she had a procedure (sounded like colonoscopy but patient was speaking very low and it was unclear) but she is having bleeding from her rectum still and "they can't figure out what it is" so she wanted a message sent back to Dr. Etter Sjogren to see if she has any idea since she is able to fix everything.

## 2022-04-13 NOTE — Progress Notes (Signed)
Progress Note  13 Days Post-Op  Subjective: Pt reports she is still having some nausea despite more ostomy output. Complaining of pain at TG drain site. She reports she is still having rectal bleeding with mobilization - reports this is a little different but unable to characterize how.   Objective: Vital signs in last 24 hours: Temp:  [98.1 F (36.7 C)-98.3 F (36.8 C)] 98.3 F (36.8 C) (01/08 0643) Pulse Rate:  [76-85] 79 (01/08 0643) Resp:  [10-20] 18 (01/08 0643) BP: (120-160)/(61-78) 138/65 (01/08 0643) SpO2:  [96 %-100 %] 96 % (01/08 0643) Last BM Date : 04/13/22  Intake/Output from previous day: 01/07 0701 - 01/08 0700 In: 2532.9 [P.O.:30; I.V.:2192.4; IV Piggyback:310.5] Out: 203 [Emesis/NG output:150; Drains:53] Intake/Output this shift: Total I/O In: 30 [P.O.:30] Out: 150 [Emesis/NG output:150]  PE: General: WD, chronically ill appearing female who is laying in bed in NAD Heart: regular, rate, and rhythm.  Lungs:  Respiratory effort nonlabored Abd: soft, appropriately ttp, ND, +BS, stoma viable with stool in appliance, incisions C/D/I, NGT with thin drainage   Lab Results:  Recent Labs    04/11/22 0411 04/11/22 1253  WBC 11.7*  --   HGB 6.8* 8.2*  HCT 22.0* 25.5*  PLT 411*  --    BMET Recent Labs    04/12/22 0302 04/13/22 0229  NA 138 136  K 3.8 3.9  CL 107 105  CO2 26 24  GLUCOSE 110* 109*  BUN 12 13  CREATININE 0.54 0.55  CALCIUM 7.7* 7.8*   PT/INR No results for input(s): "LABPROT", "INR" in the last 72 hours. CMP     Component Value Date/Time   NA 136 04/13/2022 0229   NA 140 03/21/2021 1214   K 3.9 04/13/2022 0229   CL 105 04/13/2022 0229   CO2 24 04/13/2022 0229   GLUCOSE 109 (H) 04/13/2022 0229   BUN 13 04/13/2022 0229   BUN 21 03/21/2021 1214   CREATININE 0.55 04/13/2022 0229   CREATININE 0.74 03/13/2022 1511   CALCIUM 7.8 (L) 04/13/2022 0229   PROT 5.4 (L) 04/13/2022 0229   ALBUMIN 1.7 (L) 04/13/2022 0229   AST 31  04/13/2022 0229   ALT 20 04/13/2022 0229   ALKPHOS 52 04/13/2022 0229   BILITOT 0.5 04/13/2022 0229   GFRNONAA >60 04/13/2022 0229   GFRAA 85 05/24/2020 1006   Lipase     Component Value Date/Time   LIPASE 25 03/28/2022 1904       Studies/Results: CT GUIDED PERITONEAL/RETROPERITONEAL FLUID DRAIN BY PERC CATH  Result Date: 04/12/2022 INDICATION: Prior sigmoid resection. Pelvic abscess suspicious for anastomotic breakdown. EXAM: CT-GUIDED LEFT TRANSGLUTEAL DRAIN PLACEMENT FOR PELVIC ABCESS COMPARISON:  CT AP, 04/11/2022 and 04/06/2022. MEDICATIONS: The patient is currently admitted to the hospital and receiving intravenous antibiotics. The antibiotics were administered within an appropriate time frame prior to the initiation of the procedure. ANESTHESIA/SEDATION: Moderate (conscious) sedation was employed during this procedure. A total of Versed 1.5 mg and Fentanyl 75 mcg was administered intravenously. Moderate Sedation Time: 15 minutes. The patient's level of consciousness and vital signs were monitored continuously by radiology nursing throughout the procedure under my direct supervision. CONTRAST:  None COMPLICATIONS: None immediate. PROCEDURE: RADIATION DOSE REDUCTION: This exam was performed according to the departmental dose-optimization program which includes automated exposure control, adjustment of the mA and/or kV according to patient size and/or use of iterative reconstruction technique. Informed written consent was obtained from the patient and/or patient's representative after a discussion of the risks, benefits and  alternatives to treatment. The patient was placed prone on the CT gantry and a pre procedural CT was performed re-demonstrating the known abscess/fluid collection within the deep pelvis. The procedure was planned. A timeout was performed prior to the initiation of the procedure. The LEFT gluteus was prepped and draped in the usual sterile fashion. The overlying soft tissues  were anesthetized with 1% lidocaine with epinephrine. Appropriate trajectory was planned with the use of a 22 gauge spinal needle. An 18 gauge trocar needle was advanced into the abscess/fluid collection and a short Amplatz super stiff wire was coiled within the collection. Appropriate positioning was confirmed with a limited CT scan. The tract was serially dilated allowing placement of a 12 Fr drainage catheter. Appropriate positioning was confirmed with a limited postprocedural CT scan. 5 ml of serosanguineous fluid was aspirated. The tube was connected to a drainage bag and sutured in place. A dressing was placed. The patient tolerated the procedure well without immediate post procedural complication. IMPRESSION: Successful CT guided placement of a 12 Fr drainage catheter via LEFT transgluteal approach into the pelvic abscess. Samples were sent to the laboratory as requested by the ordering clinical team. Michaelle Birks, MD [facility] Electronically Signed   By: Michaelle Birks M.D.   On: 04/12/2022 15:14   CT ABDOMEN PELVIS WO CONTRAST  Result Date: 04/11/2022 CLINICAL DATA:  Postoperative abdominal pain EXAM: CT ABDOMEN AND PELVIS WITHOUT CONTRAST TECHNIQUE: Multidetector CT imaging of the abdomen and pelvis was performed following the standard protocol without IV contrast. RADIATION DOSE REDUCTION: This exam was performed according to the departmental dose-optimization program which includes automated exposure control, adjustment of the mA and/or kV according to patient size and/or use of iterative reconstruction technique. 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 Hepatobiliary: No solid liver abnormality is seen. No gallstones, gallbladder wall thickening, or biliary dilatation. Pancreas: Unremarkable. No pancreatic ductal dilatation or surrounding  inflammatory changes. Spleen: Normal in size without significant abnormality. Adrenals/Urinary Tract: Adrenal glands are unremarkable. Kidneys are normal, without renal calculi, solid lesion, or hydronephrosis. Bladder is unremarkable. Stomach/Bowel: Esophagogastric tube with tip and side port below the diaphragm. Stomach is within normal limits. Appendix appears normal. Status post sigmoid colon resection. Vascular/Lymphatic: Aortic atherosclerosis. No enlarged abdominal or pelvic lymph nodes. Reproductive: Status post hysterectomy. Other: Severe anasarca. Left lower quadrant end colostomy. Interval removal of a surgical drain previously seen in the low pelvis, with increased volume of frothy, loculated appearing air and fluid in the posterior pelvis adjacent to the rectal stump, collection measuring at least 8.9 x 8.1 x 6.7 cm (series 2, image 74, series 6, image 84). Musculoskeletal: No acute or significant osseous findings. IMPRESSION: 1. Interval removal of a surgical drain previously seen in the low pelvis, with increased volume of frothy, loculated appearing air and fluid in the posterior pelvis adjacent to the rectal stump, collection measuring at least 8.9 x 8.1 x 6.7 cm. Findings are consistent with abscess. The stump may be assessed for leak via fluoroscopy if desired. 2. 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 3. Severe anasarca. 4. 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 Aortic Atherosclerosis (ICD10-I70.0). Electronically Signed   By: Delanna Ahmadi M.D.   On: 04/11/2022 13:55    Anti-infectives: Anti-infectives (From admission, onward)    Start     Dose/Rate Route Frequency Ordered Stop  04/11/22 1400  piperacillin-tazobactam (ZOSYN) IVPB 3.375 g        3.375 g 12.5 mL/hr over 240 Minutes Intravenous Every 8 hours 04/11/22 1143 04/16/22 1359   04/05/22 1600  piperacillin-tazobactam (ZOSYN) IVPB 3.375 g         3.375 g 12.5 mL/hr over 240 Minutes Intravenous Every 8 hours 04/05/22 1222 04/10/22 1230   03/31/22 1045  cefoTEtan (CEFOTAN) 2 g in sodium chloride 0.9 % 100 mL IVPB        2 g 200 mL/hr over 30 Minutes Intravenous On call to O.R. 03/31/22 0959 03/31/22 1225   03/29/22 0800  piperacillin-tazobactam (ZOSYN) IVPB 3.375 g  Status:  Discontinued        3.375 g 12.5 mL/hr over 240 Minutes Intravenous Every 8 hours 03/29/22 0746 04/05/22 0826        Assessment/Plan  POD#13 s/p robotic lower anterior rectosigmoid resection with colostomy and drainage of pelvic abscess by Dr. 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, probable diverticulitis  - pathology is diverticulitis no malignancy - CT AP1/1 with ileus, pelvic drain in place with decreased but residual pelvix abscess - CT 1/6 with pelvic abscess - s/p IR drain placement yesterday, cxs pending, drainage looks like blood - patient still having rectal bleeding , s/p 1 PRBC yesterday - repeat CBC this AM - mobilize as able    - WOC following for new ostomy - overall doing well with it   FEN: TPN, NGT clamping trials today  ID: zosyn 12/31>1/5; Zosyn 1/6>> VTE: hold LMWH   - below per TRH -  Chronic diastolic HF Hypothyroidism Pulmonary fibrosis HTN    LOS: 15 days    Norm Parcel, [facility] 04/13/2022, 10:49 AM Please see Amion for pager number during day hours 7:00am-4:30pm

## 2022-04-13 NOTE — Progress Notes (Signed)
PROGRESS NOTE    Alexandria [name]  QIO:[id number] DOB: 11-Feb-1946 DOA: 03/28/2022 PCP: [name], DO    Brief Narrative:  77 y/o with a history of hypothyroidism, grade I DD, HTN, IPF, diverticulosis, diverticulitis, DVT, chronic hypokalemia who has a long history of constipation and has recently tried Linzess but stopped after 2 weeks due to lack of efficacy by her report. She recently got CT abd/pelvis 03/20/22 which revealed an enhancing lesion/mass just below the sigmoid-rectal junction. Flex Sig was performed 03/20/22 which revealed a polypoid mass at the sigmoid-rectal junction. Bx was readout by pathology as an inflammatory poly w/o dysplasia or malignancy. Patient had MRI abd/pelvis 03/26/22 -showed severe sigmoid diverticulitis.  Due to continued constipation and diffuse abdominal pain, worst in the LUQ, she presented to [facility] for evaluation. General surgery was consulted. Patient underwent repeat CT abd/pelvis and thought to have complicated diverticulitis with abscess. Unable to drain per IR.  Ultimately, she underwent rectosigmoid resection with colostomy by Dr. [name] 12/26.   With poor recovery postop and persistent ileus. 1/6, recurrence of fever and worsening abdominal pain.  Bleeding from the rectum.  Repeat CT scan consistent with collection of abscess, suspected leakage from the stump. 1/7, IR drainage of intra-abdominal abscess.  Pain level is improving.     Assessment & Plan:   Complicated sigmoid diverticulitis with pelvic abscess: Persistent ileus.  Postoperative intra-abdominal collection. Rectosigmoid resection and colostomy Dr. [name] 12/26.   Biopsy with evidence of diverticulitis with pericolonic abscess and perforation.  No malignancy. Started on TPN with PICC line and had NG tube inserted.  Accidentally came off. 1/4, with persistent ileus, NG tube reinserted.  Back on TPN.  Fentanyl helped with pain without causing complications. Continues to have  rectal bleeding, intermittent likely from the stump. 1/6, persistent pain.  Recurrent low-grade fever. Repeat blood cultures negative so far.  Extended course for IV Zosyn.  CT scan with intra-abdominal collection.  IR did put a percutaneous drain to the left buttock and achieved some draining. Continue to suffer from pain, fentanyl 12.5 mcg every 2 hours was helpful.  Will start patient on fentanyl patch as she has no oral means of taking medication and continue to use IV pain medications.  Symptomatic anemia due to postop blood loss: Previous hemoglobin 12-13.  Hemoglobin dropped to 7.3-1 unit of PRBC-9.8-7.7-8.5-9.3-9.1-6.8-1 unit 8.2-hemoglobin pending today. Discontinue Lovenox and NSAIDs.  Hospital-acquired delirium: Patient developed impulsiveness and occasional confusion.  Likely related to hospitalization, medications and severe medical illness. Frequent reorientation. Mental status is clear today. Currently on Ativan through IV.  Could not tolerate fluoxetine.  Chronic medical issues including Chronic diastolic congestive heart failure, diuresis on hold.  On maintenance IV fluids and TPN.  Euvolemic. Hypothyroidism, unreliable oral intake.  Continue IV Synthroid. Idiopathic pulmonary fibrosis: Without exacerbation. Electrolytes replaced through TPN protocol.  Urinary retention: Secondary to pelvic pain and immobility.  Improved.  Rectal bleeding and secretions: Anticipating conservative management.   DVT prophylaxis: Place and maintain sequential compression device Start: 03/30/22 0940   Code Status: Full code Family Communication: None at the bedside. Disposition Plan: Status is: Inpatient Remains inpatient appropriate because: Significant abdominal pain and distention, recovery from surgery.  Persistent ileus.     Consultants:  General surgery  Procedures:  Rectosigmoid resection and colostomy  Antimicrobials:  IV Zosyn 12/23----   Subjective: Patient seen  and examined.  Abdomen pain is better today.  She was much more comfortable interacting today than other days.  Occasional nausea  but no vomiting.  NG tube keeps draining.  She has some stool on her colostomy.  Repeat hemoglobin pending today.  Patient tells me that fentanyl gave her surgical relief.  Will start her on a fentanyl patch. She thinks buttock drain went through the sciatic nerve because it gives her close episodes of pains on mobility.  Still drains blood when she changes her position.  Objective: Vitals:   04/12/22 1350 04/12/22 1410 04/12/22 2008 04/13/22 0643  BP: 120/61 126/65 127/68 138/65  Pulse: 79 85 76 79  Resp: '15 12 18 18  '$ Temp:   98.1 F (36.7 C) 98.3 F (36.8 C)  TempSrc:   Oral Oral  SpO2: 99% 96% 97% 96%  Weight:      Height:        Intake/Output Summary (Last 24 hours) at 04/13/2022 1040 Last data filed at 04/13/2022 0823 Gross per 24 hour  Intake 2012.63 ml  Output 353 ml  Net 1659.63 ml    Filed Weights   04/10/22 0500 04/11/22 0500 04/12/22 0600  Weight: 69.5 kg 69 kg 70.1 kg    Examination:  General: Frail and debilitated.  Looks comfortable today on room air.  Alert awake and oriented.  Interactive.  Without any impulsiveness confusion or delirium today. Cardiovascular: S1-S2 normal.  Regular rate rhythm. Respiratory: Bilateral clear.  No added sounds. Gastrointestinal: Soft.  Mild diffuse tenderness.  Bowel sound present.  Colostomy with about 100 mL of loose stool, no active drainage.  Percutaneous drainage with bloody secretions on JP bulb. Ext: No cyanosis or edema.      Data Reviewed: I have personally reviewed following labs and imaging studies  CBC: Recent Labs  Lab 04/07/22 1041 04/09/22 0450 04/10/22 1030 04/11/22 0411 04/11/22 1253  WBC 11.2* 10.4 11.9* 11.7*  --   HGB 9.3* 9.1* 8.2* 6.8* 8.2*  HCT 30.5* 28.6* 25.9* 22.0* 25.5*  MCV 103.7* 99.7 100.0 102.3*  --   PLT 339 405* 422* 411*  --     Basic Metabolic  Panel: Recent Labs  Lab 04/07/22 0325 04/09/22 0450 04/10/22 0221 04/11/22 0411 04/11/22 0849 04/12/22 0302 04/13/22 0229  NA 133* 135 137 138  --  138 136  K 4.2 3.8 3.4* 3.6  --  3.8 3.9  CL 100 103 103 106  --  107 105  CO2 '26 26 27 27  '$ --  26 24  GLUCOSE 91 97 160* 125*  --  110* 109*  BUN 8 6* 10 10  --  12 13  CREATININE 0.52 0.83 0.77 0.57  --  0.54 0.55  CALCIUM 7.9* 8.0* 7.6* 7.5*  --  7.7* 7.8*  MG 2.3 2.2 2.3  --   --   --  1.9  PHOS 2.7  --  2.2*  --  2.4* 3.0 3.2    GFR: Estimated Creatinine Clearance: 56.6 mL/min (by C-G formula based on SCr of 0.55 mg/dL). Liver Function Tests: Recent Labs  Lab 04/10/22 0221 04/13/22 0229  AST 16 31  ALT 11 20  ALKPHOS 53 52  BILITOT 0.3 0.5  PROT 5.7* 5.4*  ALBUMIN 1.9* 1.7*    No results for input(s): "LIPASE", "AMYLASE" in the last 168 hours.  No results for input(s): "AMMONIA" in the last 168 hours. Coagulation Profile: No results for input(s): "INR", "PROTIME" in the last 168 hours.  Cardiac Enzymes: No results for input(s): "CKTOTAL", "CKMB", "CKMBINDEX", "TROPONINI" in the last 168 hours. BNP (last 3 results) No results for input(s): "PROBNP" in  the last 8760 hours. HbA1C: No results for input(s): "HGBA1C" in the last 72 hours.  CBG: Recent Labs  Lab 04/12/22 0602 04/12/22 1122 04/12/22 1800 04/12/22 2357 04/13/22 0706  GLUCAP 137* 130* 114* 121* 127*    Lipid Profile: Recent Labs    04/13/22 0229  TRIG 60    Thyroid Function Tests: No results for input(s): "TSH", "T4TOTAL", "FREET4", "T3FREE", "THYROIDAB" in the last 72 hours. Anemia Panel: No results for input(s): "VITAMINB12", "FOLATE", "FERRITIN", "TIBC", "IRON", "RETICCTPCT" in the last 72 hours. Sepsis Labs: No results for input(s): "PROCALCITON", "LATICACIDVEN" in the last 168 hours.  Recent Results (from the past 240 hour(s))  Culture, blood (Routine X 2) w Reflex to ID Panel     Status: None (Preliminary result)   Collection  Time: 04/11/22 12:53 PM   Specimen: Right Antecubital; Blood  Result Value Ref Range Status   Specimen Description   Final    RIGHT ANTECUBITAL BOTTLES DRAWN AEROBIC ONLY Performed at [facility] [address] [place], [place] [address]    Special Requests   Final    Blood Culture adequate volume Performed at [facility] [address] [place], [place] [address]    Culture   Final    NO GROWTH 2 DAYS Performed at [facility] [address], [place], [place] [address]    Report Status PENDING  Incomplete  Culture, blood (Routine X 2) w Reflex to ID Panel     Status: None (Preliminary result)   Collection Time: 04/11/22 12:53 PM   Specimen: BLOOD RIGHT HAND  Result Value Ref Range Status   Specimen Description   Final    BLOOD RIGHT HAND BOTTLES DRAWN AEROBIC ONLY Performed at [facility], [place] [address], [place], [place] [address]    Special Requests   Final    Blood Culture adequate volume Performed at [facility] [address], [place], [place] [address]    Culture   Final    NO GROWTH 2 DAYS Performed at [facility] [address] [place], [place] [address]    Report Status PENDING  Incomplete  Aerobic/Anaerobic Culture w Gram Stain (surgical/deep wound)     Status: None (Preliminary result)   Collection Time: 04/12/22  1:19 PM   Specimen: Abscess  Result Value Ref Range Status   Specimen Description   Final    ABSCESS Performed at [facility] [address] [place], [place] [address]    Special Requests   Final    NONE Performed at [facility], [place] [address], [place], [place] [address]    Gram Stain   Final    ABUNDANT WBC PRESENT, PREDOMINANTLY PMN ABUNDANT GRAM POSITIVE COCCI IN PAIRS IN CLUSTERS FEW GRAM NEGATIVE RODS    Culture   Final    CULTURE REINCUBATED FOR BETTER GROWTH Performed at [facility], [place] [address], [place], [place] [address]    Report Status PENDING  Incomplete         Radiology Studies: CT GUIDED PERITONEAL/RETROPERITONEAL FLUID DRAIN BY [facility] CATH  Result Date: 04/12/2022 INDICATION: Prior sigmoid resection. Pelvic abscess suspicious for anastomotic breakdown. EXAM: CT-GUIDED LEFT TRANSGLUTEAL DRAIN PLACEMENT FOR PELVIC ABCESS COMPARISON:  CT AP, 04/11/2022 and 04/06/2022. MEDICATIONS: The patient is currently admitted to the hospital and receiving intravenous antibiotics. The antibiotics were administered within an appropriate time frame prior to the initiation of the procedure. ANESTHESIA/SEDATION: Moderate (  conscious) sedation was employed during this procedure. A total of Versed 1.5 mg and Fentanyl 75 mcg was administered intravenously. Moderate Sedation Time: 15 minutes. The patient's level of consciousness and vital signs were monitored continuously by radiology nursing throughout the procedure under my direct supervision. CONTRAST:  None COMPLICATIONS: None immediate. PROCEDURE: RADIATION DOSE REDUCTION: This exam was performed according to the departmental dose-optimization program which includes automated exposure control, adjustment of the mA and/or kV according to patient size and/or use of iterative reconstruction technique. Informed written consent was obtained from the patient and/or patient's representative after a discussion of the risks, benefits and alternatives to treatment. The patient was placed prone on the CT gantry and a pre procedural CT was performed re-demonstrating the known abscess/fluid collection within the deep pelvis. The procedure was planned. A timeout was performed prior to the initiation of the procedure. The LEFT gluteus was prepped and draped in the usual sterile fashion. The overlying soft tissues were anesthetized with 1% lidocaine with epinephrine. Appropriate trajectory was planned with the use of a 22 gauge spinal needle. An 18 gauge trocar needle was  advanced into the abscess/fluid collection and a short Amplatz super stiff wire was coiled within the collection. Appropriate positioning was confirmed with a limited CT scan. The tract was serially dilated allowing placement of a 12 Fr drainage catheter. Appropriate positioning was confirmed with a limited postprocedural CT scan. 5 ml of serosanguineous fluid was aspirated. The tube was connected to a drainage bag and sutured in place. A dressing was placed. The patient tolerated the procedure well without immediate post procedural complication. IMPRESSION: Successful CT guided placement of a 12 Fr drainage catheter via LEFT transgluteal approach into the pelvic abscess. Samples were sent to the laboratory as requested by the ordering clinical team. Michaelle Birks, MD [facility] Radiology Electronically Signed   By: Michaelle Birks M.D.   On: 04/12/2022 15:14   CT ABDOMEN PELVIS WO CONTRAST  Result Date: 04/11/2022 CLINICAL DATA:  Postoperative abdominal pain EXAM: CT ABDOMEN AND PELVIS WITHOUT CONTRAST TECHNIQUE: Multidetector CT imaging of the abdomen and pelvis was performed following the standard protocol without IV contrast. RADIATION DOSE REDUCTION: This exam was performed according to the departmental dose-optimization program which includes automated exposure control, adjustment of the mA and/or kV according to patient size and/or use of iterative reconstruction technique. 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 Hepatobiliary: No solid liver abnormality is seen. No gallstones, gallbladder wall thickening, or biliary dilatation. Pancreas: Unremarkable. No pancreatic ductal dilatation or surrounding inflammatory changes. Spleen: Normal in size without significant abnormality. Adrenals/Urinary Tract: Adrenal glands are unremarkable. Kidneys are normal, without  renal calculi, solid lesion, or hydronephrosis. Bladder is unremarkable. Stomach/Bowel: Esophagogastric tube with tip and side port below the diaphragm. Stomach is within normal limits. Appendix appears normal. Status post sigmoid colon resection. Vascular/Lymphatic: Aortic atherosclerosis. No enlarged abdominal or pelvic lymph nodes. Reproductive: Status post hysterectomy. Other: Severe anasarca. Left lower quadrant end colostomy. Interval removal of a surgical drain previously seen in the low pelvis, with increased volume of frothy, loculated appearing air and fluid in the posterior pelvis adjacent to the rectal stump, collection measuring at least 8.9 x 8.1 x 6.7 cm (series 2, image 74, series 6, image 84). Musculoskeletal: No acute or significant osseous findings. IMPRESSION: 1. Interval removal of a surgical drain previously seen in the low pelvis, with increased volume of frothy, loculated  appearing air and fluid in the posterior pelvis adjacent to the rectal stump, collection measuring at least 8.9 x 8.1 x 6.7 cm. Findings are consistent with abscess. The stump may be assessed for leak via fluoroscopy if desired. 2. 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 3. Severe anasarca. 4. 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 Aortic Atherosclerosis (ICD10-I70.0). Electronically Signed   By: Delanna Ahmadi M.D.   On: 04/11/2022 13:55        Scheduled Meds:  sodium chloride   Intravenous Once   Chlorhexidine Gluconate Cloth  6 each Topical Daily   fentaNYL  1 patch Transdermal Q72H   insulin aspart  0-9 Units Subcutaneous Q6H   levothyroxine  84 mcg Intravenous Daily   lidocaine  1 patch Transdermal Q24H   sodium chloride flush  10-40 mL Intracatheter Q12H   sodium chloride flush  10-40 mL Intracatheter Q12H   sodium chloride flush  3 mL Intravenous Q12H   sodium chloride flush  5 mL Per Tube Q8H   Continuous Infusions:   sodium chloride     sodium chloride Stopped (04/10/22 0819)   famotidine (PEPCID) IV 20 mg (04/13/22 0930)   ondansetron (ZOFRAN) IV     piperacillin-tazobactam (ZOSYN)  IV 3.375 g (04/13/22 0530)   promethazine (PHENERGAN) injection (IM or IVPB) 200 mL/hr at 04/10/22 1720   TPN ADULT (ION) 75 mL/hr at 04/12/22 1751   TPN ADULT (ION)       LOS: 15 days    Time spent: 35 minutes    Barb Merino, MD [facility] Pager [phone]

## 2022-04-14 DIAGNOSIS — K5730 Diverticulosis of large intestine without perforation or abscess without bleeding: Secondary | ICD-10-CM | POA: Diagnosis not present

## 2022-04-14 LAB — CBC WITH DIFFERENTIAL/PLATELET
Abs Immature Granulocytes: 0.31 10*3/uL — ABNORMAL HIGH (ref 0.00–0.07)
Basophils Absolute: 0.2 10*3/uL — ABNORMAL HIGH (ref 0.0–0.1)
Basophils Relative: 1 %
Eosinophils Absolute: 0.7 10*3/uL — ABNORMAL HIGH (ref 0.0–0.5)
Eosinophils Relative: 6 %
HCT: 29.5 % — ABNORMAL LOW (ref 36.0–46.0)
Hemoglobin: 9.6 g/dL — ABNORMAL LOW (ref 12.0–15.0)
Immature Granulocytes: 3 %
Lymphocytes Relative: 12 %
Lymphs Abs: 1.3 10*3/uL (ref 0.7–4.0)
MCH: 31.7 pg (ref 26.0–34.0)
MCHC: 32.5 g/dL (ref 30.0–36.0)
MCV: 97.4 fL (ref 80.0–100.0)
Monocytes Absolute: 1.8 10*3/uL — ABNORMAL HIGH (ref 0.1–1.0)
Monocytes Relative: 17 %
Neutro Abs: 6.9 10*3/uL (ref 1.7–7.7)
Neutrophils Relative %: 61 %
Platelets: 339 10*3/uL (ref 150–400)
RBC: 3.03 MIL/uL — ABNORMAL LOW (ref 3.87–5.11)
RDW: 17.2 % — ABNORMAL HIGH (ref 11.5–15.5)
WBC: 11.2 10*3/uL — ABNORMAL HIGH (ref 4.0–10.5)
nRBC: 0.3 % — ABNORMAL HIGH (ref 0.0–0.2)

## 2022-04-14 LAB — TYPE AND SCREEN
ABO/RH(D): A NEG
Antibody Screen: NEGATIVE
Unit division: 0
Unit division: 0
Unit division: 0

## 2022-04-14 LAB — BASIC METABOLIC PANEL
Anion gap: 6 (ref 5–15)
BUN: 11 mg/dL (ref 8–23)
CO2: 25 mmol/L (ref 22–32)
Calcium: 8 mg/dL — ABNORMAL LOW (ref 8.9–10.3)
Chloride: 104 mmol/L (ref 98–111)
Creatinine, Ser: 0.53 mg/dL (ref 0.44–1.00)
GFR, Estimated: >60 mL/min (ref >60)
Glucose, Bld: 104 mg/dL — ABNORMAL HIGH (ref 70–99)
Potassium: 3.8 mmol/L (ref 3.5–5.1)
Sodium: 135 mmol/L (ref 135–145)

## 2022-04-14 LAB — BPAM RBC
Blood Product Expiration Date: 202401122359
Blood Product Expiration Date: 202402012359
Blood Product Expiration Date: 202402062359
ISSUE DATE / TIME: 202401060824
ISSUE DATE / TIME: 202401081246
ISSUE DATE / TIME: 202401081546
Unit Type and Rh: 600
Unit Type and Rh: 600
Unit Type and Rh: 600

## 2022-04-14 LAB — GLUCOSE, CAPILLARY
Glucose-Capillary: 124 mg/dL — ABNORMAL HIGH (ref 70–99)
Glucose-Capillary: 124 mg/dL — ABNORMAL HIGH (ref 70–99)
Glucose-Capillary: 127 mg/dL — ABNORMAL HIGH (ref 70–99)
Glucose-Capillary: 129 mg/dL — ABNORMAL HIGH (ref 70–99)

## 2022-04-14 LAB — MAGNESIUM: Magnesium: 2.2 mg/dL (ref 1.7–2.4)

## 2022-04-14 MED ORDER — BOOST / RESOURCE BREEZE PO LIQD CUSTOM: 1.0000 | Freq: Three times a day (TID) | ORAL | Status: DC

## 2022-04-14 MED ORDER — TRAVASOL 10 % IV SOLN
INTRAVENOUS | Status: AC
  Filled 2022-04-14: qty 900

## 2022-04-14 NOTE — Progress Notes (Signed)
Mobility Specialist - Progress Note   04/14/22 0918  Mobility  Activity Ambulated with assistance in hallway;Transferred to/from [facility]  Level of Assistance Contact guard assist, steadying assist  Assistive Device BSC;Front wheel walker  Distance Ambulated (ft) 10 ft  Range of Motion/Exercises Active  Activity Response Tolerated well  Mobility Referral Yes  $Mobility charge 1 Mobility   Pt received in bed and agreeable to mobility. Pt is MinA from laying to sitting up. Pt requested assistance to [facility] prior to ambulating. Pt requested taking a break on recliner once done w/ [facility] transfer. Pt still eager to ambulate in hallway after taking a break. Once returning to bed, pt c/o sciatica pain in L leg. No other complaints during transfer. Pt to bed after session with all needs met, MD & nurse in room.    [facility]

## 2022-04-14 NOTE — Progress Notes (Signed)
Nutrition Follow-up  INTERVENTION:   -TPN management per Pharmacy  -Boost Breeze po TID, each supplement provides 250 kcal and 9 grams of protein  -Daily weights while on TPN  NUTRITION DIAGNOSIS:   Inadequate oral intake related to altered GI function, acute illness as evidenced by NPO status.  Ongoing.  GOAL:   Patient will meet greater than or equal to 90% of their needs  Meeting with TPN.  MONITOR:   Diet advancement, Labs, Weight trends, Skin, I & O's (TPN tolerance)  ASSESSMENT:   77 yo female admitted with diverticulitis with pelvic abscess requiring surgery, developed post op ileus and requiring initiation of TPN. PMH includes CHF, HTN, pulmonary fibrosis  12/23: admitted 12/26: CLD 12/27: Soft diet 12/29: FLD ->NPO 12/30: TPN initiated 1/2: CLD, pt pulled PICC out and NGT 1/3: made NPO, NGT replaced  TPN continues at 75 ml/hr, providing 1818 kcals and 90g protein. Pt now on clear liquids. NGT removed. Will reorder Boost Breeze for additional kcals and protein.  Admission weight: 138 lbs Current weight: 154 lbs  Medications: Pepcid  Labs reviewed: CBGs: 112-129   Diet Order:   Diet Order             Diet clear liquid Room service appropriate? Yes; Fluid consistency: Thin  Diet effective now                   EDUCATION NEEDS:   Not appropriate for education at this time  Skin:  Skin Assessment: Skin Integrity Issues: Skin Integrity Issues:: Other (Comment) Incisions: abdominal, new colostomy Other: open abdomen  Last BM:  1/9- colostomy  Height:   Ht Readings from Last 1 Encounters:  03/31/22 '5\' 4"'$  (1.626 m)    Weight:   Wt Readings from Last 1 Encounters:  04/12/22 70.1 kg    BMI:  Body mass index is 26.53 kg/m.  Estimated Nutritional Needs:   Kcal:  1700-1900 kcals  Protein:  85-100 g  Fluid:  >/= 1.7 L  [name], MS, RD, LDN Inpatient Clinical Dietitian Contact information available via Amion

## 2022-04-14 NOTE — Progress Notes (Signed)
Referring Physician(s): Toth,P  Supervising Physician: Corrie Mckusick  Patient Status:  [name]  Chief Complaint:  Pelvic abscess  Subjective: Pt without new c/o; still has some soreness at TG drain, cont rectal discharge; currently denies N/V   Allergies: Prednisone, Dilaudid [hydromorphone], Atrovent hfa [ipratropium bromide hfa], Cheratussin ac [guaifenesin-codeine], Diltiazem, Hydrocodone, Influenza vaccines, Morphine, Motrin [ibuprofen], Oxycodone, and Codeine  Medications: Prior to Admission medications   Medication Sig Start Date End Date Taking? Authorizing Provider  aspirin EC 81 MG tablet Take 81 mg by mouth daily. Swallow whole.   Yes [provider]  benzonatate (TESSALON) 100 MG capsule Take 200 mg by mouth as needed for cough. 03/06/22  Yes [provider]  furosemide (LASIX) 40 MG tablet TAKE 1 TABLET BY MOUTH TWICE A DAY Patient taking differently: Take 40 mg by mouth 2 (two) times daily. 09/03/21  Yes Lowne Chase, Yvonne R, DO  KLOR-CON M20 20 MEQ tablet Take 1 tablet (20 mEq total) by mouth 2 (two) times daily. Will take 3 a daily only as needed. Patient taking differently: Take 20 mEq by mouth 2 (two) times daily. 01/06/22  Yes [name], DO  linaclotide (LINZESS) 145 MCG CAPS capsule Take 1 capsule (145 mcg total) by mouth daily before breakfast. 03/17/22  Yes Sharyn Creamer, MD  Nintedanib (OFEV) 100 MG CAPS Take 100 mg by mouth in the morning and at bedtime. 07/03/20  Yes [provider]  ondansetron (ZOFRAN-ODT) 4 MG disintegrating tablet TAKE 1 TABLET BY MOUTH EVERY 8 HOURS AS NEEDED FOR NAUSEA AND VOMITING Patient taking differently: Take 4 mg by mouth as needed for nausea or vomiting. 07/18/21  Yes [name], DO  SYNTHROID 112 MCG tablet Take 1 tablet (112 mcg total) by mouth daily before breakfast. 03/02/22  Yes Roma Schanz R, DO  vitamin C (ASCORBIC ACID) 500 MG tablet Take 500 mg by mouth  daily.   Yes [provider]  AMBULATORY NON FORMULARY MEDICATION Medication Name: Diltiazem 2% with Lidocaine 5%- use index finger, apply small amount of medication inside the rectum up to your first knuckle/joint for 8 weeks Patient not taking: Reported on 03/29/2022 03/20/22   Sharyn Creamer, MD     Vital Signs: BP (!) 153/79 (BP Location: Right Arm)   Pulse 78   Temp 98.1 F (36.7 C) (Oral)   Resp 16   Ht '5\' 4"'$  (1.626 m)   Wt 154 lb 8.7 oz (70.1 kg)   SpO2 94%   BMI 26.53 kg/m   Physical Exam awake/alert; left TG drain intact, output about 50 cc past 12 hrs bloody fluid, drain flushed earlier today  Imaging: CT GUIDED PERITONEAL/RETROPERITONEAL FLUID DRAIN BY [facility] CATH  Result Date: 04/12/2022 INDICATION: Prior sigmoid resection. Pelvic abscess suspicious for anastomotic breakdown. EXAM: CT-GUIDED LEFT TRANSGLUTEAL DRAIN PLACEMENT FOR PELVIC ABCESS COMPARISON:  CT AP, 04/11/2022 and 04/06/2022. MEDICATIONS: The patient is currently admitted to the hospital and receiving intravenous antibiotics. The antibiotics were administered within an appropriate time frame prior to the initiation of the procedure. ANESTHESIA/SEDATION: Moderate (conscious) sedation was employed during this procedure. A total of Versed 1.5 mg and Fentanyl 75 mcg was administered intravenously. Moderate Sedation Time: 15 minutes. The patient's level of consciousness and vital signs were monitored continuously by radiology nursing throughout the procedure under my direct supervision. CONTRAST:  None COMPLICATIONS: None immediate. PROCEDURE: RADIATION DOSE REDUCTION: This exam was performed according to the departmental dose-optimization program which includes automated  exposure control, adjustment of the mA and/or kV according to patient size and/or use of iterative reconstruction technique. Informed written consent was obtained from the patient and/or patient's representative after a discussion of the risks,  benefits and alternatives to treatment. The patient was placed prone on the CT gantry and a pre procedural CT was performed re-demonstrating the known abscess/fluid collection within the deep pelvis. The procedure was planned. A timeout was performed prior to the initiation of the procedure. The LEFT gluteus was prepped and draped in the usual sterile fashion. The overlying soft tissues were anesthetized with 1% lidocaine with epinephrine. Appropriate trajectory was planned with the use of a 22 gauge spinal needle. An 18 gauge trocar needle was advanced into the abscess/fluid collection and a short Amplatz super stiff wire was coiled within the collection. Appropriate positioning was confirmed with a limited CT scan. The tract was serially dilated allowing placement of a 12 Fr drainage catheter. Appropriate positioning was confirmed with a limited postprocedural CT scan. 5 ml of serosanguineous fluid was aspirated. The tube was connected to a drainage bag and sutured in place. A dressing was placed. The patient tolerated the procedure well without immediate post procedural complication. IMPRESSION: Successful CT guided placement of a 12 Fr drainage catheter via LEFT transgluteal approach into the pelvic abscess. Samples were sent to the laboratory as requested by the ordering clinical team. Michaelle Birks, MD [facility] Radiology Electronically Signed   By: Michaelle Birks M.D.   On: 04/12/2022 15:14   CT ABDOMEN PELVIS WO CONTRAST  Result Date: 04/11/2022 CLINICAL DATA:  Postoperative abdominal pain EXAM: CT ABDOMEN AND PELVIS WITHOUT CONTRAST TECHNIQUE: Multidetector CT imaging of the abdomen and pelvis was performed following the standard protocol without IV contrast. RADIATION DOSE REDUCTION: This exam was performed according to the departmental dose-optimization program which includes automated exposure control, adjustment of the mA and/or kV according to patient  size and/or use of iterative reconstruction technique. 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 Hepatobiliary: No solid liver abnormality is seen. No gallstones, gallbladder wall thickening, or biliary dilatation. Pancreas: Unremarkable. No pancreatic ductal dilatation or surrounding inflammatory changes. Spleen: Normal in size without significant abnormality. Adrenals/Urinary Tract: Adrenal glands are unremarkable. Kidneys are normal, without renal calculi, solid lesion, or hydronephrosis. Bladder is unremarkable. Stomach/Bowel: Esophagogastric tube with tip and side port below the diaphragm. Stomach is within normal limits. Appendix appears normal. Status post sigmoid colon resection. Vascular/Lymphatic: Aortic atherosclerosis. No enlarged abdominal or pelvic lymph nodes. Reproductive: Status post hysterectomy. Other: Severe anasarca. Left lower quadrant end colostomy. Interval removal of a surgical drain previously seen in the low pelvis, with increased volume of frothy, loculated appearing air and fluid in the posterior pelvis adjacent to the rectal stump, collection measuring at least 8.9 x 8.1 x 6.7 cm (series 2, image 74, series 6, image 84). Musculoskeletal: No acute or significant osseous findings. IMPRESSION: 1. Interval removal of a surgical drain previously seen in the low pelvis, with increased volume of frothy, loculated appearing air and fluid in the posterior pelvis adjacent to the rectal stump, collection measuring at least 8.9 x 8.1 x 6.7 cm. Findings are consistent with abscess. The stump may be assessed for leak via fluoroscopy if desired. 2. 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 3. Severe anasarca. 4. 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 Aortic Atherosclerosis (  ICD10-I70.0). Electronically Signed   By:  Delanna Ahmadi M.D.   On: 04/11/2022 13:55    Labs:  CBC: Recent Labs    04/10/22 1030 04/11/22 0411 04/11/22 1253 04/13/22 1130 04/13/22 2247 04/14/22 0330  WBC 11.9* 11.7*  --  10.9*  --  11.2*  HGB 8.2* 6.8* 8.2* 6.5* 9.3* 9.6*  HCT 25.9* 22.0* 25.5* 21.2* 28.3* 29.5*  PLT 422* 411*  --  363  --  339    COAGS: Recent Labs    03/30/22 0129  INR 1.2    BMP: Recent Labs    04/11/22 0411 04/12/22 0302 04/13/22 0229 04/14/22 0330  NA 138 138 136 135  K 3.6 3.8 3.9 3.8  CL 106 107 105 104  CO2 '27 26 24 25  '$ GLUCOSE 125* 110* 109* 104*  BUN '10 12 13 11  '$ CALCIUM 7.5* 7.7* 7.8* 8.0*  CREATININE 0.57 0.54 0.55 0.53  GFRNONAA >60 >60 >60 >60    LIVER FUNCTION TESTS: Recent Labs    04/05/22 0208 04/06/22 0207 04/10/22 0221 04/13/22 0229  BILITOT 0.6 0.4 0.3 0.5  AST '20 15 16 31  '$ ALT '13 11 11 20  '$ ALKPHOS 46 47 53 52  PROT 5.2* 5.6* 5.7* 5.4*  ALBUMIN 1.8* 2.0* 1.9* 1.7*    Assessment and Plan:  POD#14 s/p robotic lower anterior rectosigmoid resection with colostomy and drainage of pelvic abscess by Dr. 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 Rowe Robert, PA-C 04/14/2022, 12:50 PM   I spent a total of 15 Minutes at the the patient's bedside AND on the patient's hospital floor or unit, greater than 50% of which was counseling/coordinating care for pelvic abscess drain    Patient ID: Alexandria [name], female    DOB: [birth year], 76 y.o.   MRN: [id number]

## 2022-04-14 NOTE — Progress Notes (Signed)
PROGRESS NOTE    Alexandria [name]  OVF:[id number] DOB: 1945-12-28 DOA: 03/28/2022 PCP: [name], DO    Brief Narrative:  77 y/o with a history of hypothyroidism, grade I DD, HTN, IPF, diverticulosis, diverticulitis, DVT, chronic hypokalemia who has a long history of constipation and has recently tried Linzess but stopped after 2 weeks due to lack of efficacy by her report. She recently got CT abd/pelvis 03/20/22 which revealed an enhancing lesion/mass just below the sigmoid-rectal junction. Flex Sig was performed 03/20/22 which revealed a polypoid mass at the sigmoid-rectal junction. Bx was readout by pathology as an inflammatory poly w/o dysplasia or malignancy. Patient had MRI abd/pelvis 03/26/22 -showed severe sigmoid diverticulitis.  Due to continued constipation and diffuse abdominal pain, worst in the LUQ, she presented to [facility] for evaluation. General surgery was consulted. Patient underwent repeat CT abd/pelvis and thought to have complicated diverticulitis with abscess. Unable to drain per IR.  Ultimately, she underwent rectosigmoid resection with colostomy by Dr. [name] 12/26.   With poor recovery postop and persistent ileus. 1/6, recurrence of fever and worsening abdominal pain.  Bleeding from the rectum.  Repeat CT scan consistent with collection of abscess, suspected leakage from the stump. 1/7, IR drainage of intra-abdominal abscess.  Pain level is improving. 1/9, NG tube clamping trial and NG tube out.  Starting on sips and clears.     Assessment & Plan:   Complicated sigmoid diverticulitis with pelvic abscess: Persistent ileus.  Postoperative intra-abdominal collection.  Rectosigmoid resection and colostomy Dr. [name] 12/26.   Biopsy with evidence of diverticulitis with pericolonic abscess and perforation.  No malignancy. Started on TPN with PICC line and had NG tube inserted.  Accidentally came off. 1/4, with persistent ileus, NG tube reinserted.  Back on TPN.   Fentanyl helped with pain without causing complications. Continues to have rectal bleeding, intermittent likely from the stump. 1/6, persistent pain.  Recurrent low-grade fever. Repeat blood cultures negative so far.  Extended course for IV Zosyn.  CT scan with intra-abdominal collection.  IR did put a percutaneous drain to the left buttock and achieved some draining. Continue to suffer from pain, fentanyl 12.5 mcg every 2 hours was helpful.  Started on fentanyl patch as she has no oral means of taking medication and continue to use IV pain medications. NG out today.  Liquid trial.  Acute blood loss anemia.  Symptomatic anemia due to postop blood loss: Baseline hemoglobin 13.   Intermittent blood loss due to surgery and rectal bleed.   Total 4 units of blood transfusion over the last 2 weeks, hemoglobin adequate today.  Recheck tomorrow.   Discontinue Lovenox and NSAIDs.  Hospital-acquired delirium: Patient developed impulsiveness and occasional confusion.  Likely related to hospitalization, medications and severe medical illness. Frequent reorientation. Mental status is clear now.   Currently on Ativan through IV.  Could not tolerate fluoxetine.  Chronic medical issues including Chronic diastolic congestive heart failure, diuresis on hold.  On maintenance IV fluids and TPN.  Euvolemic. Hypothyroidism, unreliable oral intake.  Continue IV Synthroid. Idiopathic pulmonary fibrosis: Without exacerbation. Electrolytes replaced through TPN protocol.  Urinary retention: Secondary to pelvic pain and immobility.  Improved.  Rectal bleeding and secretions and stump leak: Anticipating conservative management.   DVT prophylaxis: Place and maintain sequential compression device Start: 03/30/22 0940   Code Status: Full code Family Communication: None at the bedside. Disposition Plan: Status is: Inpatient Remains inpatient appropriate because: Significant abdominal pain and distention,  recovery from surgery.  Persistent ileus.     Consultants:  General surgery  Procedures:  Rectosigmoid resection and colostomy  Antimicrobials:  IV Zosyn 12/23----   Subjective:  Patient seen and examined.  She was very grateful with pain management to schedule.  She is motivated today.  NG tube was out.  She was asking when she can go home.  Pain is intermittent and sometimes severe.  She also has pain on the left gluteal percutaneous tube.  Urinating without difficulty now.  Remains afebrile.  Objective: Vitals:   04/13/22 1608 04/13/22 1736 04/13/22 2232 04/14/22 0530  BP: (!) 141/78 (!) 154/75 (!) 148/65 (!) 140/73  Pulse: 78 77 76 78  Resp: '15 16 16 16  '$ Temp: 99.3 F (37.4 C) 98.2 F (36.8 C) 98.6 F (37 C) 98.5 F (36.9 C)  TempSrc: Oral Oral  Oral  SpO2: 97% 97% 97% 96%  Weight:      Height:        Intake/Output Summary (Last 24 hours) at 04/14/2022 1058 Last data filed at 04/14/2022 0844 Gross per 24 hour  Intake 3065.71 ml  Output 625 ml  Net 2440.71 ml    Filed Weights   04/10/22 0500 04/11/22 0500 04/12/22 0600  Weight: 69.5 kg 69 kg 70.1 kg    Examination:  General: Frail and debilitated.  Looks comfortable today on room air.  Alert awake and oriented.  Interactive.  Without any impulsiveness confusion or delirium today. Appropriately anxious and frustrated. Cardiovascular: S1-S2 normal.  Regular rate rhythm. Respiratory: Bilateral clear.  No added sounds. Gastrointestinal: Soft.  Mild diffuse tenderness.  Bowel sound present.  Colostomy with very small amount of loose stool, no active drainage.  Percutaneous drainage with bloody secretions on JP bulb. Ext: No cyanosis or edema.      Data Reviewed: I have personally reviewed following labs and imaging studies  CBC: Recent Labs  Lab 04/09/22 0450 04/10/22 1030 04/11/22 0411 04/11/22 1253 04/13/22 1130 04/13/22 2247 04/14/22 0330  WBC 10.4 11.9* 11.7*  --  10.9*  --  11.2*  NEUTROABS  --    --   --   --   --   --  6.9  HGB 9.1* 8.2* 6.8* 8.2* 6.5* 9.3* 9.6*  HCT 28.6* 25.9* 22.0* 25.5* 21.2* 28.3* 29.5*  MCV 99.7 100.0 102.3*  --  105.5*  --  97.4  PLT 405* 422* 411*  --  363  --  160    Basic Metabolic Panel: Recent Labs  Lab 04/09/22 0450 04/10/22 0221 04/11/22 0411 04/11/22 0849 04/12/22 0302 04/13/22 0229 04/14/22 0330  NA 135 137 138  --  138 136 135  K 3.8 3.4* 3.6  --  3.8 3.9 3.8  CL 103 103 106  --  107 105 104  CO2 '26 27 27  '$ --  '26 24 25  '$ GLUCOSE 97 160* 125*  --  110* 109* 104*  BUN 6* 10 10  --  '12 13 11  '$ CREATININE 0.83 0.77 0.57  --  0.54 0.55 0.53  CALCIUM 8.0* 7.6* 7.5*  --  7.7* 7.8* 8.0*  MG 2.2 2.3  --   --   --  1.9 2.2  PHOS  --  2.2*  --  2.4* 3.0 3.2  --     GFR: Estimated Creatinine Clearance: 56.6 mL/min (by C-G formula based on SCr of 0.53 mg/dL). Liver Function Tests: Recent Labs  Lab 04/10/22 0221 04/13/22 0229  AST 16 31  ALT 11 20  ALKPHOS 53 52  BILITOT 0.3 0.5  PROT 5.7* 5.4*  ALBUMIN 1.9* 1.7*    No results for input(s): "LIPASE", "AMYLASE" in the last 168 hours.  No results for input(s): "AMMONIA" in the last 168 hours. Coagulation Profile: No results for input(s): "INR", "PROTIME" in the last 168 hours.  Cardiac Enzymes: No results for input(s): "CKTOTAL", "CKMB", "CKMBINDEX", "TROPONINI" in the last 168 hours. BNP (last 3 results) No results for input(s): "PROBNP" in the last 8760 hours. HbA1C: No results for input(s): "HGBA1C" in the last 72 hours.  CBG: Recent Labs  Lab 04/13/22 0706 04/13/22 1252 04/13/22 1808 04/14/22 0001 04/14/22 0534  GLUCAP 127* 112* 121* 124* 129*    Lipid Profile: Recent Labs    04/13/22 0229  TRIG 60     Thyroid Function Tests: No results for input(s): "TSH", "T4TOTAL", "FREET4", "T3FREE", "THYROIDAB" in the last 72 hours. Anemia Panel: No results for input(s): "VITAMINB12", "FOLATE", "FERRITIN", "TIBC", "IRON", "RETICCTPCT" in the last 72 hours. Sepsis  Labs: No results for input(s): "PROCALCITON", "LATICACIDVEN" in the last 168 hours.  Recent Results (from the past 240 hour(s))  Culture, blood (Routine X 2) w Reflex to ID Panel     Status: None (Preliminary result)   Collection Time: 04/11/22 12:53 PM   Specimen: Right Antecubital; Blood  Result Value Ref Range Status   Specimen Description   Final    RIGHT ANTECUBITAL BOTTLES DRAWN AEROBIC ONLY Performed at [facility] [address], [place], [place] [address]    Special Requests   Final    Blood Culture adequate volume Performed at [facility] [address], [place], [place] [address]    Culture   Final    NO GROWTH 3 DAYS Performed at [facility], [place] [address], [place], [place] [address]    Report Status PENDING  Incomplete  Culture, blood (Routine X 2) w Reflex to ID Panel     Status: None (Preliminary result)   Collection Time: 04/11/22 12:53 PM   Specimen: BLOOD RIGHT HAND  Result Value Ref Range Status   Specimen Description   Final    BLOOD RIGHT HAND BOTTLES DRAWN AEROBIC ONLY Performed at [facility], [place] [address] [place], [place] [address]    Special Requests   Final    Blood Culture adequate volume Performed at [facility] [address] [place], [place] [address]    Culture   Final    NO GROWTH 3 DAYS Performed at [facility], [place] [address], [place], [place] [address]    Report Status PENDING  Incomplete  Aerobic/Anaerobic Culture w Gram Stain (surgical/deep wound)     Status: None (Preliminary result)   Collection Time: 04/12/22  1:19 PM   Specimen: Abscess  Result Value Ref Range Status   Specimen Description   Final    ABSCESS Performed at [facility] [address], [place], [place] [address]    Special Requests   Final    NONE Performed at [facility], [place] [address], [place], [place] [address]    Gram Stain   Final     ABUNDANT WBC PRESENT, PREDOMINANTLY PMN ABUNDANT GRAM POSITIVE COCCI IN PAIRS IN CLUSTERS FEW GRAM NEGATIVE RODS    Culture   Final    ABUNDANT ENTEROCOCCUS FAECALIS SUSCEPTIBILITIES TO FOLLOW Performed at [facility], [place] [address], [place], [place] [address]    Report Status PENDING  Incomplete  Radiology Studies: CT GUIDED PERITONEAL/RETROPERITONEAL FLUID DRAIN BY [facility] CATH  Result Date: 04/12/2022 INDICATION: Prior sigmoid resection. Pelvic abscess suspicious for anastomotic breakdown. EXAM: CT-GUIDED LEFT TRANSGLUTEAL DRAIN PLACEMENT FOR PELVIC ABCESS COMPARISON:  CT AP, 04/11/2022 and 04/06/2022. MEDICATIONS: The patient is currently admitted to the hospital and receiving intravenous antibiotics. The antibiotics were administered within an appropriate time frame prior to the initiation of the procedure. ANESTHESIA/SEDATION: Moderate (conscious) sedation was employed during this procedure. A total of Versed 1.5 mg and Fentanyl 75 mcg was administered intravenously. Moderate Sedation Time: 15 minutes. The patient's level of consciousness and vital signs were monitored continuously by radiology nursing throughout the procedure under my direct supervision. CONTRAST:  None COMPLICATIONS: None immediate. PROCEDURE: RADIATION DOSE REDUCTION: This exam was performed according to the departmental dose-optimization program which includes automated exposure control, adjustment of the mA and/or kV according to patient size and/or use of iterative reconstruction technique. Informed written consent was obtained from the patient and/or patient's representative after a discussion of the risks, benefits and alternatives to treatment. The patient was placed prone on the CT gantry and a pre procedural CT was performed re-demonstrating the known abscess/fluid collection within the deep pelvis. The procedure was planned. A timeout was performed prior to the initiation of the procedure. The LEFT  gluteus was prepped and draped in the usual sterile fashion. The overlying soft tissues were anesthetized with 1% lidocaine with epinephrine. Appropriate trajectory was planned with the use of a 22 gauge spinal needle. An 18 gauge trocar needle was advanced into the abscess/fluid collection and a short Amplatz super stiff wire was coiled within the collection. Appropriate positioning was confirmed with a limited CT scan. The tract was serially dilated allowing placement of a 12 Fr drainage catheter. Appropriate positioning was confirmed with a limited postprocedural CT scan. 5 ml of serosanguineous fluid was aspirated. The tube was connected to a drainage bag and sutured in place. A dressing was placed. The patient tolerated the procedure well without immediate post procedural complication. IMPRESSION: Successful CT guided placement of a 12 Fr drainage catheter via LEFT transgluteal approach into the pelvic abscess. Samples were sent to the laboratory as requested by the ordering clinical team. Michaelle Birks, MD [facility] Radiology Electronically Signed   By: Michaelle Birks M.D.   On: 04/12/2022 15:14        Scheduled Meds:  Chlorhexidine Gluconate Cloth  6 each Topical Daily   fentaNYL  1 patch Transdermal Q72H   insulin aspart  0-9 Units Subcutaneous Q6H   levothyroxine  84 mcg Intravenous Daily   lidocaine  1 patch Transdermal Q24H   sodium chloride flush  10-40 mL Intracatheter Q12H   sodium chloride flush  10-40 mL Intracatheter Q12H   sodium chloride flush  3 mL Intravenous Q12H   sodium chloride flush  5 mL Per Tube Q8H   Continuous Infusions:  sodium chloride     sodium chloride 10 mL/hr at 04/13/22 1306   famotidine (PEPCID) IV 20 mg (04/14/22 0918)   ondansetron (ZOFRAN) IV     piperacillin-tazobactam (ZOSYN)  IV 3.375 g (04/14/22 3329)   promethazine (PHENERGAN) injection (IM or IVPB) 200 mL/hr at 04/10/22 1720   TPN ADULT (ION) 75  mL/hr at 04/13/22 1816   TPN ADULT (ION)       LOS: 16 days    Time spent: 35 minutes    Barb Merino, MD [facility] Pager [phone]

## 2022-04-14 NOTE — Consult Note (Signed)
[place] Nurse ostomy follow up Stoma type/location: LLQ, colostomy Stomal assessment/size: 1 1/2" round, budded, pink, moist Peristomal assessment: skin has improved since last pouch change, no irritation noted Treatment options for stomal/peristomal skin: 2" barrier ring Output soft brown/green Ostomy pouching: 1pc.soft convex with 2" skin barrier ring Education provided:  Demonstrated pouch change (cutting new skin barrier, cleaning peristomal skin and stoma, use of barrier ring) Re-Education on emptying when 1/3 to 1/2 full and how to empty Re-Demonstrated use of wick to clean spout  Patient was not able to cut new skin barrier Patient did clean skin but required reinforcement Patient worked with barrier ring but needs assistance with placement Patient can not see to place 1pc soft convex, I think we may need to consider 2pc while inpatient to see if she can do this easier She reports she will not have any assistance or support at home. Does not have anyone outside the home to ask to cut skin barriers.  She was adamant to not have viewing window cut to allow her to visualize better.   Placed 2pc 2 1/4" skin barriers and pouches in the room for use later this week with next pouch change.   Enrolled patient in [place] Start Discharge program: Yes  Patient could definitely use support from [facility] at [place] Nurse will follow along with you for continued support with ostomy teaching and care [place] MSN, [place], [place], [place], [place]

## 2022-04-14 NOTE — Progress Notes (Signed)
Pt had spoken about her wanting the NG tube out. She then asked why the doctor hasn't ordered for it to come out. Nurse shared with her that there isn't an order for the NG tube to come out, right now. Encouraged her to talk with the doctor, this morning, about removing the NG tube. Nurse has had to have the same repeated conversation with her this morning, periodically.

## 2022-04-14 NOTE — Progress Notes (Signed)
Progress Note  14 Days Post-Op  Subjective: NGT dislodged overnight and was barely in this AM. Patient tolerated clamping trials well yesterday. She reports constant nausea but then also states she is not nauseated. She reports continued rectal discharge and we discussed that she may have a rectal stump leak that hematoma is decompressing through. Often refusing mobilization attempts but seems a little more motivated this AM.   Objective: Vital signs in last 24 hours: Temp:  [97.9 F (36.6 C)-99.3 F (37.4 C)] 98.5 F (36.9 C) (01/09 0530) Pulse Rate:  [73-80] 78 (01/09 0530) Resp:  [15-16] 16 (01/09 0530) BP: (140-154)/(60-78) 140/73 (01/09 0530) SpO2:  [96 %-100 %] 96 % (01/09 0530) Last BM Date : 04/13/22  Intake/Output from previous day: 01/08 0701 - 01/09 0700 In: 3545.8 [P.O.:210; I.V.:2559.7; Blood:528; IV Piggyback:228.1] Out: 720 [Urine:400; Emesis/NG output:250; Drains:45; Stool:25] Intake/Output this shift: Total I/O In: -  Out: 53 [Drains:5; Stool:50]  PE: General: WD, chronically ill appearing female who is laying in bed in NAD Heart: regular, rate, and rhythm.  Lungs:  Respiratory effort nonlabored Abd: soft, appropriately ttp, ND, +BS, stoma viable with stool in appliance, incisions C/D/I, NGT removed    Lab Results:  Recent Labs    04/13/22 1130 04/13/22 2247 04/14/22 0330  WBC 10.9*  --  11.2*  HGB 6.5* 9.3* 9.6*  HCT 21.2* 28.3* 29.5*  PLT 363  --  339    BMET Recent Labs    04/13/22 0229 04/14/22 0330  NA 136 135  K 3.9 3.8  CL 105 104  CO2 24 25  GLUCOSE 109* 104*  BUN 13 11  CREATININE 0.55 0.53  CALCIUM 7.8* 8.0*    PT/INR No results for input(s): "LABPROT", "INR" in the last 72 hours. CMP     Component Value Date/Time   NA 135 04/14/2022 0330   NA 140 03/21/2021 1214   K 3.8 04/14/2022 0330   CL 104 04/14/2022 0330   CO2 25 04/14/2022 0330   GLUCOSE 104 (H) 04/14/2022 0330   BUN 11 04/14/2022 0330   BUN 21 03/21/2021  1214   CREATININE 0.53 04/14/2022 0330   CREATININE 0.74 03/13/2022 1511   CALCIUM 8.0 (L) 04/14/2022 0330   PROT 5.4 (L) 04/13/2022 0229   ALBUMIN 1.7 (L) 04/13/2022 0229   AST 31 04/13/2022 0229   ALT 20 04/13/2022 0229   ALKPHOS 52 04/13/2022 0229   BILITOT 0.5 04/13/2022 0229   GFRNONAA >60 04/14/2022 0330   GFRAA 85 05/24/2020 1006   Lipase     Component Value Date/Time   LIPASE 25 03/28/2022 1904       Studies/Results: CT GUIDED PERITONEAL/RETROPERITONEAL FLUID DRAIN BY PERC CATH  Result Date: 04/12/2022 INDICATION: Prior sigmoid resection. Pelvic abscess suspicious for anastomotic breakdown. EXAM: CT-GUIDED LEFT TRANSGLUTEAL DRAIN PLACEMENT FOR PELVIC ABCESS COMPARISON:  CT AP, 04/11/2022 and 04/06/2022. MEDICATIONS: The patient is currently admitted to the hospital and receiving intravenous antibiotics. The antibiotics were administered within an appropriate time frame prior to the initiation of the procedure. ANESTHESIA/SEDATION: Moderate (conscious) sedation was employed during this procedure. A total of Versed 1.5 mg and Fentanyl 75 mcg was administered intravenously. Moderate Sedation Time: 15 minutes. The patient's level of consciousness and vital signs were monitored continuously by radiology nursing throughout the procedure under my direct supervision. CONTRAST:  None COMPLICATIONS: None immediate. PROCEDURE: RADIATION DOSE REDUCTION: This exam was performed according to the departmental dose-optimization program which includes automated exposure control, adjustment of the mA and/or kV  according to patient size and/or use of iterative reconstruction technique. Informed written consent was obtained from the patient and/or patient's representative after a discussion of the risks, benefits and alternatives to treatment. The patient was placed prone on the CT gantry and a pre procedural CT was performed re-demonstrating the known abscess/fluid collection within the deep pelvis. The  procedure was planned. A timeout was performed prior to the initiation of the procedure. The LEFT gluteus was prepped and draped in the usual sterile fashion. The overlying soft tissues were anesthetized with 1% lidocaine with epinephrine. Appropriate trajectory was planned with the use of a 22 gauge spinal needle. An 18 gauge trocar needle was advanced into the abscess/fluid collection and a short Amplatz super stiff wire was coiled within the collection. Appropriate positioning was confirmed with a limited CT scan. The tract was serially dilated allowing placement of a 12 Fr drainage catheter. Appropriate positioning was confirmed with a limited postprocedural CT scan. 5 ml of serosanguineous fluid was aspirated. The tube was connected to a drainage bag and sutured in place. A dressing was placed. The patient tolerated the procedure well without immediate post procedural complication. IMPRESSION: Successful CT guided placement of a 12 Fr drainage catheter via LEFT transgluteal approach into the pelvic abscess. Samples were sent to the laboratory as requested by the ordering clinical team. Michaelle Birks, MD [facility] Electronically Signed   By: Michaelle Birks M.D.   On: 04/12/2022 15:14    Anti-infectives: Anti-infectives (From admission, onward)    Start     Dose/Rate Route Frequency Ordered Stop   04/11/22 1400  piperacillin-tazobactam (ZOSYN) IVPB 3.375 g        3.375 g 12.5 mL/hr over 240 Minutes Intravenous Every 8 hours 04/11/22 1143 04/16/22 1359   04/05/22 1600  piperacillin-tazobactam (ZOSYN) IVPB 3.375 g        3.375 g 12.5 mL/hr over 240 Minutes Intravenous Every 8 hours 04/05/22 1222 04/10/22 1230   03/31/22 1045  cefoTEtan (CEFOTAN) 2 g in sodium chloride 0.9 % 100 mL IVPB        2 g 200 mL/hr over 30 Minutes Intravenous On call to O.R. 03/31/22 0959 03/31/22 1225   03/29/22 0800  piperacillin-tazobactam (ZOSYN) IVPB 3.375 g   Status:  Discontinued        3.375 g 12.5 mL/hr over 240 Minutes Intravenous Every 8 hours 03/29/22 0746 04/05/22 0826        Assessment/Plan  POD#14 s/p robotic lower anterior rectosigmoid resection with colostomy and drainage of pelvic abscess by Dr. 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, probable diverticulitis  - pathology is diverticulitis no malignancy - CT AP1/1 with ileus, pelvic drain in place with decreased but residual pelvix abscess - CT 1/6 with pelvic abscess vs hematoma, appears to have rectal stump leak - s/p IR drain placement 1/7, cxs with enterococcus, drainage still appears bloody  - hgb 9.6 s/p 2 units prbc 1/8, stable  - mobilize as able  - WOC following for new ostomy - overall doing well with it   FEN: TPN, NGT out, CLD as tolerated  ID: zosyn 12/31>1/5; Zosyn 1/6>> VTE: LMWH   - below per TRH -  Chronic diastolic HF Hypothyroidism Pulmonary fibrosis HTN    LOS: 16 days    Norm Parcel, [facility] 04/14/2022, 10:33 AM Please see Amion for pager number during day hours 7:00am-4:30pm

## 2022-04-14 NOTE — Progress Notes (Signed)
PHARMACY - TOTAL PARENTERAL NUTRITION CONSULT NOTE   Indication: Prolonged ileus  Patient Measurements: Height: '5\' 4"'$  (162.6 cm) Weight: 70.1 kg (154 lb 8.7 oz) IBW/kg (Calculated) : 54.7 TPN AdjBW (KG): 63 Body mass index is 26.53 kg/m. Usual Weight:   Assessment:  Pharmacy is consulted to dose TPN in 77 yo female with prolonged ileus. Pt  is s/p robotic lower anterior rectosigmoid resection with colostomy and drainage of pelvic abscess by Dr. [name] 12/26.   Glucose / Insulin:  No history of DM, A1c 5.4 (03/18/20).  - CBGs: at goal < 150 - sSSI:  used 3 units / 24 hrs Electrolytes: WNL including CorrCa Renal: SCr < 1, BUN WNL Hepatic: LFT, alk phos, Tbili WNL (1/8) - albumin low at 1.7 - TG 60 (04/13/22)  Intake / Output; MIVF: Not completely recorded; 1/6 CT shows anasarca - 0.9%NaCl @ 10 ml/hr, PRBC transfusion 04/11/22, 04/13/22 - Output:  Urine x12 unmeasured, no stools, NG 250 mL, drain 45 mL - Bloody rectal secretions reported  GI Imaging: - 62/22: complicated sigmoid diverticulitis. Distal sigmoid colon shows wall thickening and adjacent inflammation. - 1/1 CT: decreasing residual pelvic abscess, ileus, ostomy, distended gallbladder, small left pleural effusion w/ pulm fibrosis - 1/6 CT:  pelvic abscess, concern for stump leak  GI Surgeries / Procedures:  - 12/26: robotic lower anterior rectosigmoid resection with colostomy and drainage of pelvic abscess  - 1/7 IR transgluteal pelvic drain placement  Central access:   1/4: Replaced PICC TPN start date:  12/30-1/2, restarted 1/4   Nutritional Goals: Goal TPN rate is 75 mL/hr (provides 90 g of protein and 1818 kcals per day)  RD Assessment: Estimated Needs Total Energy Estimated Needs: 1700-1900 kcals Total Protein Estimated Needs: 85-100 g Total Fluid Estimated Needs: >/= 1.7 L  Current Nutrition:  NPO and TPN  Plan:  Continue TPN at 75 mL/hr, goal rate  Electrolytes in TPN:  Na 117mq/L (increase) K 667m/L  (increase) Ca 56m27mL  Mg 5 mEq/L  Phos 25 mmol/L  Cl:Ac 1:1  Continue standard MVI and trace elements in TPN Continue CBGs and SSI q 6hrs  mIVF: 0.9% NaCl @ 10 ml/hr TPN lab panel Mon/Thurs and prn   EriPeggyann JubaharmD, BCPS [facility] main pharmacy [phone]/2024 7:00 AM

## 2022-04-14 NOTE — Progress Notes (Signed)
Physical Therapy Treatment Patient Details Name: Alexandria [name] MRN: [id number] DOB: 1946/03/17 Today's Date: 04/14/2022   History of Present Illness 77 y/o female presented with continued constipation and diffuse abdominal pain, worst in the LUQ. Now s/p robotic lower anterior rectosigmoid resection with colostomy and drainage of pelvic abscess 03/31/22.  Plan for transgluteal pelvic drain placement on 04/12/22.  PHMx:hypothyroidism, grade I DD, HTN, IPF, diverticulosis, diverticulitis, DVT, chronic hypokalemia    PT Comments    Pt agreeable with encouragement. Overall min/guard assist but requiring incr time to complete functional tasks d/t pain. Will continue efforts to mobilize in acute setting. Discussed incr amb distance goal for tomorrow with PT/nursing staff  or mobility team, pt in agreement  Recommendations for follow up therapy are one component of a multi-disciplinary discharge planning process, led by the attending physician.  Recommendations may be updated based on patient status, additional functional criteria and insurance authorization.  Follow Up Recommendations  Home health PT     Assistance Recommended at Discharge Intermittent Supervision/Assistance  Patient can return home with the following A little help with bathing/dressing/bathroom;Assistance with cooking/housework;Assist for transportation;Help with stairs or ramp for entrance   Equipment Recommendations  Rollator (4 wheels)    Recommendations for Other Services       Precautions / Restrictions Precautions Precaution Comments: colcotomy, JP drain on R side; bloody rectal leakage-use pads Restrictions Weight Bearing Restrictions: No     Mobility  Bed Mobility Overal bed mobility: Needs Assistance Bed Mobility: Supine to Sit, Sit to Supine     Supine to sit: HOB elevated, Min assist Sit to supine: Min assist, Min guard   General bed mobility comments: partial log roll d/t R hip pain with flexion;  assist to elevate trunk. incr time    Transfers Overall transfer level: Needs assistance Equipment used: Rolling walker (2 wheels) Transfers: Sit to/from Stand Sit to Stand: Min guard   Step pivot transfers: Min guard       General transfer comment: cues for hand placement, min/guard for safety and lines    Ambulation/Gait Ambulation/Gait assistance: Min guard Gait Distance (Feet): 20 Feet Assistive device: Rolling walker (2 wheels) Gait Pattern/deviations: Step-through pattern, Decreased stride length, Trunk flexed       General Gait Details: cues for trunk extension as able   Stairs             Wheelchair Mobility    Modified Rankin (Stroke Patients Only)       Balance                                            Cognition Arousal/Alertness: Awake/alert Behavior During Therapy: WFL for tasks assessed/performed Overall Cognitive Status: Within Functional Limits for tasks assessed                                          Exercises      General Comments        Pertinent Vitals/Pain Pain Assessment Pain Assessment: Faces Faces Pain Scale: Hurts little more Pain Location: abdomen and R hip Pain Descriptors / Indicators: Sore Pain Intervention(s): Limited activity within patient's tolerance, Monitored during session, Repositioned    Home Living  Prior Function            PT Goals (current goals can now be found in the care plan section) Acute Rehab PT Goals Patient Stated Goal: return to walking 2.5 miles/day PT Goal Formulation: With patient Time For Goal Achievement: 04/23/22 Potential to Achieve Goals: Good Progress towards PT goals: Progressing toward goals    Frequency    Min 3X/week      PT Plan Current plan remains appropriate    Co-evaluation              AM-PAC PT "6 Clicks" Mobility   Outcome Measure  Help needed turning from your back to  your side while in a flat bed without using bedrails?: A Little Help needed moving from lying on your back to sitting on the side of a flat bed without using bedrails?: A Little Help needed moving to and from a bed to a chair (including a wheelchair)?: A Little Help needed standing up from a chair using your arms (e.g., wheelchair or bedside chair)?: A Little Help needed to walk in hospital room?: A Little Help needed climbing 3-5 steps with a railing? : A Little 6 Click Score: 18    End of Session Equipment Utilized During Treatment: Gait belt Activity Tolerance: Patient tolerated treatment well Patient left: in bed;with call bell/phone within reach   PT Visit Diagnosis: Difficulty in walking, not elsewhere classified (R26.2)     Time: 7846-9629 PT Time Calculation (min) (ACUTE ONLY): 33 min  Charges:  $Gait Training: 8-22 mins $Therapeutic Activity: 8-22 mins                     Baxter Flattery, PT  Acute Rehab Dept [facility]) [phone]  [facility] Weekend Pager (Saturday/Sunday only)  [phone]  04/14/2022    [facility] 04/14/2022, 3:55 PM

## 2022-04-15 ENCOUNTER — Ambulatory Visit: Payer: Medicare Other | Admitting: Cardiology

## 2022-04-15 DIAGNOSIS — K5730 Diverticulosis of large intestine without perforation or abscess without bleeding: Secondary | ICD-10-CM | POA: Diagnosis not present

## 2022-04-15 LAB — CBC WITH DIFFERENTIAL/PLATELET
Abs Immature Granulocytes: 0.29 10*3/uL — ABNORMAL HIGH (ref 0.00–0.07)
Basophils Absolute: 0.1 10*3/uL (ref 0.0–0.1)
Basophils Relative: 2 %
Eosinophils Absolute: 0.7 10*3/uL — ABNORMAL HIGH (ref 0.0–0.5)
Eosinophils Relative: 9 %
HCT: 29.9 % — ABNORMAL LOW (ref 36.0–46.0)
Hemoglobin: 9.6 g/dL — ABNORMAL LOW (ref 12.0–15.0)
Immature Granulocytes: 4 %
Lymphocytes Relative: 12 %
Lymphs Abs: 1 10*3/uL (ref 0.7–4.0)
MCH: 31.8 pg (ref 26.0–34.0)
MCHC: 32.1 g/dL (ref 30.0–36.0)
MCV: 99 fL (ref 80.0–100.0)
Monocytes Absolute: 1.8 10*3/uL — ABNORMAL HIGH (ref 0.1–1.0)
Monocytes Relative: 21 %
Neutro Abs: 4.5 10*3/uL (ref 1.7–7.7)
Neutrophils Relative %: 52 %
Platelets: 313 10*3/uL (ref 150–400)
RBC: 3.02 MIL/uL — ABNORMAL LOW (ref 3.87–5.11)
RDW: 17.1 % — ABNORMAL HIGH (ref 11.5–15.5)
WBC: 8.4 10*3/uL (ref 4.0–10.5)
nRBC: 0 % (ref 0.0–0.2)

## 2022-04-15 LAB — AEROBIC/ANAEROBIC CULTURE W GRAM STAIN (SURGICAL/DEEP WOUND)

## 2022-04-15 LAB — BASIC METABOLIC PANEL
Anion gap: 6 (ref 5–15)
BUN: 14 mg/dL (ref 8–23)
CO2: 25 mmol/L (ref 22–32)
Calcium: 7.9 mg/dL — ABNORMAL LOW (ref 8.9–10.3)
Chloride: 102 mmol/L (ref 98–111)
Creatinine, Ser: 0.59 mg/dL (ref 0.44–1.00)
GFR, Estimated: >60 mL/min (ref >60)
Glucose, Bld: 118 mg/dL — ABNORMAL HIGH (ref 70–99)
Potassium: 3.9 mmol/L (ref 3.5–5.1)
Sodium: 133 mmol/L — ABNORMAL LOW (ref 135–145)

## 2022-04-15 LAB — GLUCOSE, CAPILLARY
Glucose-Capillary: 101 mg/dL — ABNORMAL HIGH (ref 70–99)
Glucose-Capillary: 119 mg/dL — ABNORMAL HIGH (ref 70–99)
Glucose-Capillary: 120 mg/dL — ABNORMAL HIGH (ref 70–99)
Glucose-Capillary: 131 mg/dL — ABNORMAL HIGH (ref 70–99)
Glucose-Capillary: 139 mg/dL — ABNORMAL HIGH (ref 70–99)

## 2022-04-15 MED ORDER — TRAMADOL HCL 50 MG PO TABS
50.0000 mg | ORAL_TABLET | Freq: Four times a day (QID) | ORAL | Status: DC | PRN
  Administered 2022-04-16 – 2022-04-17 (×6): 50 mg via ORAL
  Filled 2022-04-15 (×6): qty 1

## 2022-04-15 MED ORDER — PHENYLEPHRINE HCL-NACL 20-0.9 MG/250ML-% IV SOLN
INTRAVENOUS | Status: AC
  Filled 2022-04-15: qty 500

## 2022-04-15 MED ORDER — INSULIN ASPART 100 UNIT/ML IJ SOLN: 0.0000 [IU] | Freq: Every day | INTRAMUSCULAR | Status: DC

## 2022-04-15 MED ORDER — SODIUM CHLORIDE 0.9 % IV SOLN: 1.0000 g | Freq: Four times a day (QID) | INTRAVENOUS | Status: DC

## 2022-04-15 MED ORDER — POLYETHYLENE GLYCOL 3350 17 G PO PACK
17.0000 g | PACK | Freq: Every day | ORAL | Status: DC
  Administered 2022-04-15 – 2022-04-18 (×4): 17 g via ORAL
  Filled 2022-04-15 (×4): qty 1

## 2022-04-15 MED ORDER — TRAVASOL 10 % IV SOLN
INTRAVENOUS | Status: AC
  Filled 2022-04-15: qty 900

## 2022-04-15 MED ORDER — ENSURE ENLIVE PO LIQD
237.0000 mL | Freq: Two times a day (BID) | ORAL | Status: DC
  Administered 2022-04-18 – 2022-04-19 (×2): 237 mL via ORAL

## 2022-04-15 MED ORDER — SODIUM CHLORIDE 0.9 % IV SOLN
3.0000 g | Freq: Four times a day (QID) | INTRAVENOUS | Status: AC
  Administered 2022-04-15 – 2022-04-18 (×12): 3 g via INTRAVENOUS
  Filled 2022-04-15 (×13): qty 8

## 2022-04-15 MED ORDER — ACETAMINOPHEN 325 MG PO TABS: 650.0000 mg | ORAL_TABLET | Freq: Four times a day (QID) | ORAL | Status: DC | PRN

## 2022-04-15 MED ORDER — INSULIN ASPART 100 UNIT/ML IJ SOLN
0.0000 [IU] | Freq: Three times a day (TID) | INTRAMUSCULAR | Status: DC
  Administered 2022-04-15 (×2): 1 [IU] via SUBCUTANEOUS

## 2022-04-15 NOTE — Progress Notes (Signed)
PROGRESS NOTE    Alexandria [name]  EPP:[id number] DOB: 04/10/1945 DOA: 03/28/2022 PCP: [name], DO    Brief Narrative:  77 y/o with a history of hypothyroidism, grade I DD, HTN, IPF, diverticulosis, diverticulitis, DVT, chronic hypokalemia who has a long history of constipation and has recently tried Linzess but stopped after 2 weeks due to lack of efficacy by her report. She recently got CT abd/pelvis 03/20/22 which revealed an enhancing lesion/mass just below the sigmoid-rectal junction. Flex Sig was performed 03/20/22 which revealed a polypoid mass at the sigmoid-rectal junction. Bx was readout by pathology as an inflammatory poly w/o dysplasia or malignancy. Patient had MRI abd/pelvis 03/26/22 -showed severe sigmoid diverticulitis.  Due to continued constipation and diffuse abdominal pain, worst in the LUQ, she presented to [facility] for evaluation. General surgery was consulted. Patient underwent repeat CT abd/pelvis and thought to have complicated diverticulitis with abscess. Unable to drain per IR.  Ultimately, she underwent rectosigmoid resection with colostomy by Dr. [name] 12/26.   With poor recovery postop and persistent ileus. 1/6, recurrence of fever and worsening abdominal pain.  Bleeding from the rectum.  Repeat CT scan consistent with collection of abscess, suspected leakage from the stump. 1/7, IR drainage of intra-abdominal abscess.  Pain level is improving. 1/9, NG tube clamping trial and NG tube out.  Starting on clears.  Still not much ostomy output.     Assessment & Plan:   Complicated sigmoid diverticulitis with pelvic abscess: Persistent ileus.  Postoperative intra-abdominal collection.  Rectosigmoid resection and colostomy Dr. [name] 12/26.   Biopsy with evidence of diverticulitis with pericolonic abscess and perforation.  No malignancy. Started on TPN with PICC line and had NG tube inserted.  Accidentally came off. 1/4, with persistent ileus, NG tube  reinserted.  Back on TPN.  Fentanyl helped with pain without causing complications. Continues to have rectal bleeding, intermittent likely from the stump. 1/6, persistent pain.  Recurrent low-grade fever. Repeat blood cultures negative so far.  Extended course for IV Zosyn.  CT scan with intra-abdominal collection.  IR did put a percutaneous drain to the left buttock and achieved some draining. Continue to suffer from pain, fentanyl 12.5 mcg every 2 hours was helpful.  Started on fentanyl patch as she has no oral means of taking medication and continue to use IV pain medications. Now on clear liquid trial. 1/7, intra-abdominal abscess culture positive with Enterococcus faecalis and Bacteroides..  Blood cultures negative.  Will change antibiotics to Unasyn.  Once she has reliable oral intake, will treat with Augmentin.  Acute blood loss anemia.  Symptomatic anemia due to postop blood loss: Baseline hemoglobin 13.   Intermittent blood loss due to surgery and rectal bleed.   Total 4 units of blood transfusion over the last 2 weeks, hemoglobin adequate today.  Recheck tomorrow.   Discontinue Lovenox and NSAIDs.  SCDs.  Hospital-acquired delirium: Patient developed impulsiveness and occasional confusion.  Likely related to hospitalization, medications and severe medical illness. Frequent reorientation. Mental status is clear now.   Currently on Ativan through IV.  Could not tolerate fluoxetine.  Chronic medical issues including Chronic diastolic congestive heart failure, diuresis on hold.  On maintenance IV fluids and TPN.  Euvolemic. Hypothyroidism, unreliable oral intake.  Continue IV Synthroid. Idiopathic pulmonary fibrosis: Without exacerbation. Electrolytes replaced through TPN protocol.  Urinary retention: Secondary to pelvic pain and immobility.  Improved.  Rectal bleeding and secretions and stump leak: Anticipating conservative management.  Continues to have intermittent  bleeding.  DVT prophylaxis: Place and maintain sequential compression device Start: 03/30/22 0940   Code Status: Full code Family Communication: None at the bedside. Disposition Plan: Status is: Inpatient Remains inpatient appropriate because: Significant abdominal pain and distention, recovery from surgery.  Persistent ileus.     Consultants:  General surgery  Procedures:  Rectosigmoid resection and colostomy  Antimicrobials:  IV Zosyn 12/23----   Subjective:  Patient seen and examined with the surgical service.  Pain is controlled.  Denies any nausea but she is just doing ice water.  She wants to try some tea today.  Surgery is planning to give her dose of MiraLAX.  She was able to get up and mobilize to the bedside commode.  Wants to walk in the hallway.  Does not have any appetite.  Feels some gas coming out in her colostomy.  Objective: Vitals:   04/14/22 0530 04/14/22 1135 04/15/22 0015 04/15/22 0436  BP: (!) 140/73 (!) 153/79 (!) 160/71 (!) 152/82  Pulse: 78 78 78 75  Resp: '16 16 16 16  '$ Temp: 98.5 F (36.9 C) 98.1 F (36.7 C) 98.7 F (37.1 C) 98.3 F (36.8 C)  TempSrc: Oral Oral Oral Oral  SpO2: 96% 94% 97% 94%  Weight:      Height:        Intake/Output Summary (Last 24 hours) at 04/15/2022 1053 Last data filed at 04/15/2022 1000 Gross per 24 hour  Intake 2732.82 ml  Output 26 ml  Net 2706.82 ml   Filed Weights   04/10/22 0500 04/11/22 0500 04/12/22 0600  Weight: 69.5 kg 69 kg 70.1 kg    Examination:  General: Looks comfortable today on room air.  Alert awake and oriented.  Interactive.  In normal mood today. Cardiovascular: S1-S2 normal.  Regular rate rhythm. Respiratory: Bilateral clear.  No added sounds. Gastrointestinal: Soft.  Mild diffuse tenderness.  Bowel sounds present.  Colostomy is dry.  Percutaneous drainage with less than 5 bloody secretions on JP bulb. Ext: No cyanosis or edema.      Data Reviewed: I have personally reviewed  following labs and imaging studies  CBC: Recent Labs  Lab 04/10/22 1030 04/11/22 0411 04/11/22 1253 04/13/22 1130 04/13/22 2247 04/14/22 0330 04/15/22 0414  WBC 11.9* 11.7*  --  10.9*  --  11.2* 8.4  NEUTROABS  --   --   --   --   --  6.9 4.5  HGB 8.2* 6.8* 8.2* 6.5* 9.3* 9.6* 9.6*  HCT 25.9* 22.0* 25.5* 21.2* 28.3* 29.5* 29.9*  MCV 100.0 102.3*  --  105.5*  --  97.4 99.0  PLT 422* 411*  --  363  --  339 191   Basic Metabolic Panel: Recent Labs  Lab 04/09/22 0450 04/10/22 0221 04/11/22 0411 04/11/22 0849 04/12/22 0302 04/13/22 0229 04/14/22 0330 04/15/22 0414  NA 135 137 138  --  138 136 135 133*  K 3.8 3.4* 3.6  --  3.8 3.9 3.8 3.9  CL 103 103 106  --  107 105 104 102  CO2 '26 27 27  '$ --  '26 24 25 25  '$ GLUCOSE 97 160* 125*  --  110* 109* 104* 118*  BUN 6* 10 10  --  '12 13 11 14  '$ CREATININE 0.83 0.77 0.57  --  0.54 0.55 0.53 0.59  CALCIUM 8.0* 7.6* 7.5*  --  7.7* 7.8* 8.0* 7.9*  MG 2.2 2.3  --   --   --  1.9 2.2  --   PHOS  --  2.2*  --  2.4* 3.0 3.2  --   --    GFR: Estimated Creatinine Clearance: 56.6 mL/min (by C-G formula based on SCr of 0.59 mg/dL). Liver Function Tests: Recent Labs  Lab 04/10/22 0221 04/13/22 0229  AST 16 31  ALT 11 20  ALKPHOS 53 52  BILITOT 0.3 0.5  PROT 5.7* 5.4*  ALBUMIN 1.9* 1.7*   No results for input(s): "LIPASE", "AMYLASE" in the last 168 hours.  No results for input(s): "AMMONIA" in the last 168 hours. Coagulation Profile: No results for input(s): "INR", "PROTIME" in the last 168 hours.  Cardiac Enzymes: No results for input(s): "CKTOTAL", "CKMB", "CKMBINDEX", "TROPONINI" in the last 168 hours. BNP (last 3 results) No results for input(s): "PROBNP" in the last 8760 hours. HbA1C: No results for input(s): "HGBA1C" in the last 72 hours.  CBG: Recent Labs  Lab 04/14/22 0534 04/14/22 1133 04/14/22 1715 04/15/22 0016 04/15/22 0432  GLUCAP 129* 124* 127* 119* 120*   Lipid Profile: Recent Labs    04/13/22 0229   TRIG 60    Thyroid Function Tests: No results for input(s): "TSH", "T4TOTAL", "FREET4", "T3FREE", "THYROIDAB" in the last 72 hours. Anemia Panel: No results for input(s): "VITAMINB12", "FOLATE", "FERRITIN", "TIBC", "IRON", "RETICCTPCT" in the last 72 hours. Sepsis Labs: No results for input(s): "PROCALCITON", "LATICACIDVEN" in the last 168 hours.  Recent Results (from the past 240 hour(s))  Culture, blood (Routine X 2) w Reflex to ID Panel     Status: None (Preliminary result)   Collection Time: 04/11/22 12:53 PM   Specimen: Right Antecubital; Blood  Result Value Ref Range Status   Specimen Description   Final    RIGHT ANTECUBITAL BOTTLES DRAWN AEROBIC ONLY Performed at [facility] [address], [place], [place] [address]    Special Requests   Final    Blood Culture adequate volume Performed at [facility] [address], [place], [place] [address]    Culture   Final    NO GROWTH 4 DAYS Performed at [facility], [place] [address], [place], [place] [address]    Report Status PENDING  Incomplete  Culture, blood (Routine X 2) w Reflex to ID Panel     Status: None (Preliminary result)   Collection Time: 04/11/22 12:53 PM   Specimen: BLOOD RIGHT HAND  Result Value Ref Range Status   Specimen Description   Final    BLOOD RIGHT HAND BOTTLES DRAWN AEROBIC ONLY Performed at [facility], [place] [address] [place], [place] [address]    Special Requests   Final    Blood Culture adequate volume Performed at [facility] [address] [place], [place] [address]    Culture   Final    NO GROWTH 4 DAYS Performed at [facility], [place] [address], [place], [place] [address]    Report Status PENDING  Incomplete  Aerobic/Anaerobic Culture w Gram Stain (surgical/deep wound)     Status: None   Collection Time: 04/12/22  1:19 PM   Specimen: Abscess  Result Value Ref Range Status   Specimen Description   Final     ABSCESS Performed at [facility] [address], [place], [place] [address]    Special Requests   Final    NONE Performed at [facility], [place] [address], [place], [place] [address]    Gram Stain   Final    ABUNDANT WBC PRESENT, PREDOMINANTLY PMN ABUNDANT GRAM  POSITIVE COCCI IN PAIRS IN CLUSTERS FEW GRAM NEGATIVE RODS    Culture   Final    ABUNDANT ENTEROCOCCUS FAECALIS ABUNDANT BACTEROIDES FRAGILIS ABUNDANT BACTEROIDES OVATUS BETA LACTAMASE POSITIVE Performed at [facility], [place] [address], [place], [place] [address]    Report Status 04/15/2022 FINAL  Final   Organism ID, Bacteria ENTEROCOCCUS FAECALIS  Final      Susceptibility   Enterococcus faecalis - MIC*    AMPICILLIN <=2 SENSITIVE Sensitive     VANCOMYCIN 2 SENSITIVE Sensitive     GENTAMICIN SYNERGY SENSITIVE Sensitive     * ABUNDANT ENTEROCOCCUS FAECALIS         Radiology Studies: No results found.      Scheduled Meds:  Chlorhexidine Gluconate Cloth  6 each Topical Daily   feeding supplement  1 Container Oral TID BM   fentaNYL  1 patch Transdermal Q72H   insulin aspart  0-5 Units Subcutaneous QHS   insulin aspart  0-9 Units Subcutaneous TID WC   levothyroxine  84 mcg Intravenous Daily   lidocaine  1 patch Transdermal Q24H   phenylephrine       polyethylene glycol  17 g Oral Daily   sodium chloride flush  10-40 mL Intracatheter Q12H   sodium chloride flush  10-40 mL Intracatheter Q12H   sodium chloride flush  3 mL Intravenous Q12H   sodium chloride flush  5 mL Per Tube Q8H   Continuous Infusions:  sodium chloride     sodium chloride 10 mL/hr at 04/13/22 1306   ampicillin (OMNIPEN) IV     famotidine (PEPCID) IV 20 mg (04/15/22 0850)   ondansetron (ZOFRAN) IV     promethazine (PHENERGAN) injection (IM or IVPB) 200 mL/hr at 04/10/22 1720   TPN ADULT (ION) 75 mL/hr at 04/14/22 1755   TPN ADULT (ION)       LOS: 17 days    Time spent: 35  minutes    Barb Merino, MD [facility] Pager [phone]

## 2022-04-15 NOTE — Plan of Care (Signed)
  Problem: Coping: Goal: Level of anxiety will decrease Outcome: Progressing   Problem: Elimination: Goal: Will not experience complications related to bowel motility Outcome: Progressing Goal: Will not experience complications related to urinary retention Outcome: Progressing   Problem: Pain Managment: Goal: General experience of comfort will improve Outcome: Progressing   

## 2022-04-15 NOTE — Progress Notes (Signed)
PHARMACY - TOTAL PARENTERAL NUTRITION CONSULT NOTE   Indication: Prolonged ileus  Patient Measurements: Height: '5\' 4"'$  (162.6 cm) Weight: 70.1 kg (154 lb 8.7 oz) IBW/kg (Calculated) : 54.7 TPN AdjBW (KG): 63 Body mass index is 26.53 kg/m. Usual Weight:   Assessment:  Pharmacy is consulted to dose TPN in 77 yo female with prolonged ileus. Pt  is s/p robotic lower anterior rectosigmoid resection with colostomy and drainage of pelvic abscess by Dr. [name] 12/26.   Glucose / Insulin:  No history of DM, A1c 5.4 (03/18/20).  - CBGs: at goal < 150 - sSSI:  used 2 units / 24 hrs Electrolytes: Na low, others WNL including CorrCa Renal: SCr < 1, BUN WNL Hepatic: LFT, alk phos, Tbili WNL (1/8) - albumin low at 1.7 - TG 60 (04/13/22)  Intake / Output; MIVF: Not completely recorded; 1/6 CT shows anasarca - 0.9%NaCl @ 10 ml/hr, PRBC transfusion 04/11/22, 04/13/22 - Output:  Urine x6 unmeasured, no stools, NG out 1/9, drain 26 mL - Bloody rectal secretions reported  GI Imaging: - 54/62: complicated sigmoid diverticulitis. Distal sigmoid colon shows wall thickening and adjacent inflammation. - 1/1 CT: decreasing residual pelvic abscess, ileus, ostomy, distended gallbladder, small left pleural effusion w/ pulm fibrosis - 1/6 CT:  pelvic abscess, concern for stump leak  GI Surgeries / Procedures:  - 12/26: robotic lower anterior rectosigmoid resection with colostomy and drainage of pelvic abscess  - 1/7 IR transgluteal pelvic drain placement  Central access:   1/4: Replaced PICC TPN start date:  12/30-1/2, restarted 1/4   Nutritional Goals: Goal TPN rate is 75 mL/hr (provides 90 g of protein and 1818 kcals per day)  RD Assessment: Estimated Needs Total Energy Estimated Needs: 1700-1900 kcals Total Protein Estimated Needs: 85-100 g Total Fluid Estimated Needs: >/= 1.7 L  Current Nutrition:  TPN CLD 1/9 Boost Breeze po TID (refusing)  Plan:  Continue TPN at 75 mL/hr, goal rate   Electrolytes in TPN:  Na 1568mq/L (increase) K 68m/L  Ca 68m56mL  Mg 5 mEq/L  Phos 25 mmol/L  Cl:Ac 1:1  Continue standard MVI and trace elements in TPN Continue CBGs and SSI, change to ACHS  mIVF: 0.9% NaCl @ 10 ml/hr TPN lab panel Mon/Thurs and prn   EriPeggyann JubaharmD, BCPS [facility] main pharmacy [phone]/2024 7:03 AM

## 2022-04-15 NOTE — Progress Notes (Signed)
Progress Note  15 Days Post-Op  Subjective: Patient denies n/v this AM. No stool/gas in ostomy since yesterday. Mobilizing more and seems a little more motivated today. Still having rectal discharge that is bloody, unchanged. She was having similar prior to surgery as well.   Objective: Vital signs in last 24 hours: Temp:  [98.1 F (36.7 C)-98.7 F (37.1 C)] 98.3 F (36.8 C) (01/10 0436) Pulse Rate:  [75-78] 75 (01/10 0436) Resp:  [16] 16 (01/10 0436) BP: (152-160)/(71-82) 152/82 (01/10 0436) SpO2:  [94 %-97 %] 94 % (01/10 0436) Last BM Date : 04/14/22 (from colostomy)  Intake/Output from previous day: 01/09 0701 - 01/10 0700 In: 3100.3 [P.O.:720; I.V.:2078.1; IV Piggyback:292.3] Out: 38 [Drains:26; Stool:50] Intake/Output this shift: No intake/output data recorded.  PE: General: WD, chronically ill appearing female who is laying in bed in NAD Heart: regular, rate, and rhythm.  Lungs:  Respiratory effort nonlabored Abd: soft, appropriately ttp, ND, +BS, stoma viable with nothing in appliance, incisions C/D/I   Lab Results:  Recent Labs    04/14/22 0330 04/15/22 0414  WBC 11.2* 8.4  HGB 9.6* 9.6*  HCT 29.5* 29.9*  PLT 339 313    BMET Recent Labs    04/14/22 0330 04/15/22 0414  NA 135 133*  K 3.8 3.9  CL 104 102  CO2 25 25  GLUCOSE 104* 118*  BUN 11 14  CREATININE 0.53 0.59  CALCIUM 8.0* 7.9*    PT/INR No results for input(s): "LABPROT", "INR" in the last 72 hours. CMP     Component Value Date/Time   NA 133 (L) 04/15/2022 0414   NA 140 03/21/2021 1214   K 3.9 04/15/2022 0414   CL 102 04/15/2022 0414   CO2 25 04/15/2022 0414   GLUCOSE 118 (H) 04/15/2022 0414   BUN 14 04/15/2022 0414   BUN 21 03/21/2021 1214   CREATININE 0.59 04/15/2022 0414   CREATININE 0.74 03/13/2022 1511   CALCIUM 7.9 (L) 04/15/2022 0414   PROT 5.4 (L) 04/13/2022 0229   ALBUMIN 1.7 (L) 04/13/2022 0229   AST 31 04/13/2022 0229   ALT 20 04/13/2022 0229   ALKPHOS 52  04/13/2022 0229   BILITOT 0.5 04/13/2022 0229   GFRNONAA >60 04/15/2022 0414   GFRAA 85 05/24/2020 1006   Lipase     Component Value Date/Time   LIPASE 25 03/28/2022 1904       Studies/Results: No results found.  Anti-infectives: Anti-infectives (From admission, onward)    Start     Dose/Rate Route Frequency Ordered Stop   04/11/22 1400  piperacillin-tazobactam (ZOSYN) IVPB 3.375 g        3.375 g 12.5 mL/hr over 240 Minutes Intravenous Every 8 hours 04/11/22 1143 04/16/22 1359   04/05/22 1600  piperacillin-tazobactam (ZOSYN) IVPB 3.375 g        3.375 g 12.5 mL/hr over 240 Minutes Intravenous Every 8 hours 04/05/22 1222 04/10/22 1230   03/31/22 1045  cefoTEtan (CEFOTAN) 2 g in sodium chloride 0.9 % 100 mL IVPB        2 g 200 mL/hr over 30 Minutes Intravenous On call to O.R. 03/31/22 0959 03/31/22 1225   03/29/22 0800  piperacillin-tazobactam (ZOSYN) IVPB 3.375 g  Status:  Discontinued        3.375 g 12.5 mL/hr over 240 Minutes Intravenous Every 8 hours 03/29/22 0746 04/05/22 0826        Assessment/Plan  POD#15 s/p robotic lower anterior rectosigmoid resection with colostomy and drainage of pelvic abscess by Dr. 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, probable diverticulitis  - pathology is diverticulitis no malignancy - CT AP1/1 with ileus, pelvic drain in place with decreased but residual pelvix abscess - CT 1/6 with pelvic abscess vs hematoma, appears to have rectal stump leak - s/p IR drain placement 1/7, cxs with enterococcus, on abx - continue CLD today with low output from stoma, add miralax - hgb 9.6 s/p 2 units prbc 1/8, stable  - mobilize as able  - WOC following for new ostomy - overall doing well with it   FEN: TPN, CLD as tolerated  ID: zosyn 12/31>1/5; Zosyn 1/6>> VTE: LMWH   - below per TRH -  Chronic diastolic HF Hypothyroidism Pulmonary fibrosis HTN    LOS: 17 days    Norm Parcel, [facility]  Surgery 04/15/2022, 10:17 AM Please see Amion for pager number during day hours 7:00am-4:30pm

## 2022-04-15 NOTE — Plan of Care (Signed)
  Problem: Coping: Goal: Level of anxiety will decrease Outcome: Progressing   Problem: Pain Managment: Goal: General experience of comfort will improve Outcome: Progressing   Problem: Safety: Goal: Ability to remain free from injury will improve Outcome: Progressing   Problem: Elimination: Goal: Will not experience complications related to bowel motility Outcome: Progressing Goal: Will not experience complications related to urinary retention Outcome: Progressing

## 2022-04-16 DIAGNOSIS — K5730 Diverticulosis of large intestine without perforation or abscess without bleeding: Secondary | ICD-10-CM | POA: Diagnosis not present

## 2022-04-16 LAB — CBC WITH DIFFERENTIAL/PLATELET
Abs Immature Granulocytes: 0.18 10*3/uL — ABNORMAL HIGH (ref 0.00–0.07)
Basophils Absolute: 0.1 10*3/uL (ref 0.0–0.1)
Basophils Relative: 2 %
Eosinophils Absolute: 0.7 10*3/uL — ABNORMAL HIGH (ref 0.0–0.5)
Eosinophils Relative: 10 %
HCT: 29.2 % — ABNORMAL LOW (ref 36.0–46.0)
Hemoglobin: 9.3 g/dL — ABNORMAL LOW (ref 12.0–15.0)
Immature Granulocytes: 3 %
Lymphocytes Relative: 15 %
Lymphs Abs: 1.1 10*3/uL (ref 0.7–4.0)
MCH: 32 pg (ref 26.0–34.0)
MCHC: 31.8 g/dL (ref 30.0–36.0)
MCV: 100.3 fL — ABNORMAL HIGH (ref 80.0–100.0)
Monocytes Absolute: 1.8 10*3/uL — ABNORMAL HIGH (ref 0.1–1.0)
Monocytes Relative: 25 %
Neutro Abs: 3.3 10*3/uL (ref 1.7–7.7)
Neutrophils Relative %: 45 %
Platelets: 288 10*3/uL (ref 150–400)
RBC: 2.91 MIL/uL — ABNORMAL LOW (ref 3.87–5.11)
RDW: 16.9 % — ABNORMAL HIGH (ref 11.5–15.5)
WBC: 7.1 10*3/uL (ref 4.0–10.5)
nRBC: 0 % (ref 0.0–0.2)

## 2022-04-16 LAB — CULTURE, BLOOD (ROUTINE X 2)
Culture: NO GROWTH
Culture: NO GROWTH
Special Requests: ADEQUATE
Special Requests: ADEQUATE

## 2022-04-16 LAB — PREALBUMIN: Prealbumin: 15 mg/dL — ABNORMAL LOW (ref 18–38)

## 2022-04-16 LAB — COMPREHENSIVE METABOLIC PANEL
ALT: 24 U/L (ref 0–44)
AST: 33 U/L (ref 15–41)
Albumin: 2 g/dL — ABNORMAL LOW (ref 3.5–5.0)
Alkaline Phosphatase: 56 U/L (ref 38–126)
Anion gap: 5 (ref 5–15)
BUN: 12 mg/dL (ref 8–23)
CO2: 26 mmol/L (ref 22–32)
Calcium: 7.9 mg/dL — ABNORMAL LOW (ref 8.9–10.3)
Chloride: 107 mmol/L (ref 98–111)
Creatinine, Ser: 0.56 mg/dL (ref 0.44–1.00)
GFR, Estimated: >60 mL/min (ref >60)
Glucose, Bld: 124 mg/dL — ABNORMAL HIGH (ref 70–99)
Potassium: 4 mmol/L (ref 3.5–5.1)
Sodium: 138 mmol/L (ref 135–145)
Total Bilirubin: 0.4 mg/dL (ref 0.3–1.2)
Total Protein: 6.2 g/dL — ABNORMAL LOW (ref 6.5–8.1)

## 2022-04-16 LAB — PHOSPHORUS: Phosphorus: 2.7 mg/dL (ref 2.5–4.6)

## 2022-04-16 LAB — MAGNESIUM: Magnesium: 2.5 mg/dL — ABNORMAL HIGH (ref 1.7–2.4)

## 2022-04-16 LAB — GLUCOSE, CAPILLARY: Glucose-Capillary: 126 mg/dL — ABNORMAL HIGH (ref 70–99)

## 2022-04-16 MED ORDER — TRAVASOL 10 % IV SOLN
INTRAVENOUS | Status: AC
  Filled 2022-04-16: qty 900

## 2022-04-16 MED ORDER — TRAVASOL 10 % IV SOLN: INTRAVENOUS | Status: DC

## 2022-04-16 NOTE — Progress Notes (Signed)
Physical Therapy Treatment Patient Details Name: Alexandria [name] MRN: [id number] DOB: 01-12-1946 Today's Date: 04/16/2022   History of Present Illness 77 y/o female presented with continued constipation and diffuse abdominal pain, worst in the LUQ. Now s/p robotic lower anterior rectosigmoid resection with colostomy and drainage of pelvic abscess 03/31/22.  Plan for transgluteal pelvic drain placement on 04/12/22.  PHMx:hypothyroidism, grade I DD, HTN, IPF, diverticulosis, diverticulitis, DVT, chronic hypokalemia    PT Comments    The patient is encouraged that she ambulated in hall, 25' x 2. Continue mobility .   Recommendations for follow up therapy are one component of a multi-disciplinary discharge planning process, led by the attending physician.  Recommendations may be updated based on patient status, additional functional criteria and insurance authorization.  Follow Up Recommendations  Home health PT Can patient physically be transported by private vehicle: Yes   Assistance Recommended at Discharge Intermittent Supervision/Assistance  Patient can return home with the following A little help with bathing/dressing/bathroom;Assistance with cooking/housework;Assist for transportation;Help with stairs or ramp for entrance   Equipment Recommendations  Rollator (4 wheels)    Recommendations for Other Services       Precautions / Restrictions Precautions Precaution Comments: colcotomy, JP drain on R side; bloody rectal leakage-use pads Restrictions Weight Bearing Restrictions: No     Mobility  Bed Mobility Overal bed mobility: Modified Independent                  Transfers   Equipment used: Rolling walker (2 wheels)   Sit to Stand: Supervision           General transfer comment: steady to rise from bed and toilet    Ambulation/Gait Ambulation/Gait assistance: Min guard Gait Distance (Feet): 75 Feet (x 2) Assistive device: Rolling walker (2  wheels) Gait Pattern/deviations: Step-through pattern Gait velocity: decr     General Gait Details: much improved posture and  stride, has sneakers which reports improves gait   Stairs             Wheelchair Mobility    Modified Rankin (Stroke Patients Only)       Balance Overall balance assessment: Mild deficits observed, not formally tested   Sitting balance-Leahy Scale: Good                                      Cognition Arousal/Alertness: Awake/alert Behavior During Therapy: WFL for tasks assessed/performed                                            Exercises      General Comments        Pertinent Vitals/Pain Pain Assessment Faces Pain Scale: Hurts a little bit Pain Location: abdomen and R hip Pain Descriptors / Indicators: Sore Pain Intervention(s): Premedicated before session    Home Living                          Prior Function            PT Goals (current goals can now be found in the care plan section) Progress towards PT goals: Progressing toward goals    Frequency    Min 3X/week      PT Plan Current plan remains appropriate  Co-evaluation              AM-PAC PT "6 Clicks" Mobility   Outcome Measure  Help needed turning from your back to your side while in a flat bed without using bedrails?: None Help needed moving from lying on your back to sitting on the side of a flat bed without using bedrails?: None Help needed moving to and from a bed to a chair (including a wheelchair)?: A Little Help needed standing up from a chair using your arms (e.g., wheelchair or bedside chair)?: A Little Help needed to walk in hospital room?: A Little Help needed climbing 3-5 steps with a railing? : A Lot 6 Click Score: 19    End of Session Equipment Utilized During Treatment: Gait belt Activity Tolerance: Patient tolerated treatment well Patient left: in chair;with call bell/phone within  reach Nurse Communication: Mobility status PT Visit Diagnosis: Difficulty in walking, not elsewhere classified (R26.2)     Time: 1531-1600 PT Time Calculation (min) (ACUTE ONLY): 29 min  Charges:  $Gait Training: 8-22 mins $Self Care/Home Management: [place] Office [phone] Weekend pager-[phone]    Claretha Cooper 04/16/2022, 4:10 PM

## 2022-04-16 NOTE — Progress Notes (Signed)
PT Cancellation Note  Patient Details Name: MERRIDITH DERSHEM MRN: [id number] DOB: 02-28-1946   Cancelled Treatment:    Reason Eval/Treat Not Completed: Pain limiting ability to participate  Will chack back another time. Patient  plans to Dc home. [place] Office [phone] Weekend [phone]  Claretha Cooper 04/16/2022, 10:50 AM

## 2022-04-16 NOTE — TOC Progression Note (Signed)
Transition of Care [facility]) - Progression Note    Patient Details  Name: Alexandria [name] MRN: [id number] Date of Birth: [birth year]  Transition of Care [facility]) CM/SW Contact  Rodney Booze, LCSW Phone Number: 04/16/2022, 9:35 AM  Clinical Narrative:     CSW has reviewed the chart, at this time the patient does not look medically stable. The patient will need [facility] PT/ OT/ RN. TOC will continue to follow this patient.   Expected Discharge Plan: [place] Barriers to Discharge: Continued Medical Work up  Expected Discharge Plan and Services In-house Referral: Clinical Social Work     Living arrangements for the past 2 months: Single Family Home                                       Social Determinants of Health (SDOH) Interventions [place]: No Food Insecurity (03/31/2022)  Housing: Low Risk  (03/31/2022)  Transportation Needs: No Transportation Needs (03/31/2022)  Utilities: Not At Risk (03/31/2022)  Depression (PHQ2-9): Low Risk  (02/10/2022)  Tobacco Use: Low Risk  (04/01/2022)    Readmission Risk Interventions    04/08/2022    2:29 PM 04/03/2022    8:29 AM  Readmission Risk Prevention Plan  Post Dischage Appt  Complete  Medication Screening  Complete  Transportation Screening Complete Complete  PCP or Specialist Appt within 5-7 Days Complete   Home Care Screening Complete   Medication Review (RN CM) Complete

## 2022-04-16 NOTE — Progress Notes (Signed)
Progress Note  16 Days Post-Op  Subjective: Patient has tolerating FLD and is having some ostomy output this AM. Much more motivated to mobilize. Still having rectal drainage but managing this with a pad. Working on transition to PO pain meds.   Objective: Vital signs in last 24 hours: Temp:  [98 F (36.7 C)-98.6 F (37 C)] 98 F (36.7 C) (01/11 0501) Pulse Rate:  [74-76] 74 (01/11 0501) Resp:  [16-18] 18 (01/11 0501) BP: (147-165)/(71-75) 165/75 (01/11 0501) SpO2:  [96 %-99 %] 96 % (01/11 0501) Weight:  [68.8 kg] 68.8 kg (01/11 0500) Last BM Date : 04/14/22  Intake/Output from previous day: 01/10 0701 - 01/11 0700 In: 2433.5 [P.O.:660; I.V.:1451.9; IV Piggyback:316.7] Out: 27.5 [Drains:27.5] Intake/Output this shift: Total I/O In: 120 [P.O.:120] Out: 2.5 [Drains:2.5]  PE: General: WD, chronically ill appearing female who is laying in bed in NAD Heart: regular, rate, and rhythm.  Lungs:  Respiratory effort nonlabored Abd: soft, appropriately ttp, ND, +BS, stoma viable with nothing in appliance, incisions C/D/I, drain with scant bloody fluid    Lab Results:  Recent Labs    04/15/22 0414 04/16/22 0333  WBC 8.4 7.1  HGB 9.6* 9.3*  HCT 29.9* 29.2*  PLT 313 288    BMET Recent Labs    04/15/22 0414 04/16/22 0333  NA 133* 138  K 3.9 4.0  CL 102 107  CO2 25 26  GLUCOSE 118* 124*  BUN 14 12  CREATININE 0.59 0.56  CALCIUM 7.9* 7.9*    PT/INR No results for input(s): "LABPROT", "INR" in the last 72 hours. CMP     Component Value Date/Time   NA 138 04/16/2022 0333   NA 140 03/21/2021 1214   K 4.0 04/16/2022 0333   CL 107 04/16/2022 0333   CO2 26 04/16/2022 0333   GLUCOSE 124 (H) 04/16/2022 0333   BUN 12 04/16/2022 0333   BUN 21 03/21/2021 1214   CREATININE 0.56 04/16/2022 0333   CREATININE 0.74 03/13/2022 1511   CALCIUM 7.9 (L) 04/16/2022 0333   PROT 6.2 (L) 04/16/2022 0333   ALBUMIN 2.0 (L) 04/16/2022 0333   AST 33 04/16/2022 0333   ALT 24  04/16/2022 0333   ALKPHOS 56 04/16/2022 0333   BILITOT 0.4 04/16/2022 0333   GFRNONAA >60 04/16/2022 0333   GFRAA 85 05/24/2020 1006   Lipase     Component Value Date/Time   LIPASE 25 03/28/2022 1904       Studies/Results: No results found.  Anti-infectives: Anti-infectives (From admission, onward)    Start     Dose/Rate Route Frequency Ordered Stop   04/15/22 1200  ampicillin (OMNIPEN) 1 g in sodium chloride 0.9 % 100 mL IVPB  Status:  Discontinued        1 g 300 mL/hr over 20 Minutes Intravenous Every 6 hours 04/15/22 1044 04/15/22 1053   04/15/22 1200  Ampicillin-Sulbactam (UNASYN) 3 g in sodium chloride 0.9 % 100 mL IVPB        3 g 200 mL/hr over 30 Minutes Intravenous Every 6 hours 04/15/22 1053     04/11/22 1400  piperacillin-tazobactam (ZOSYN) IVPB 3.375 g  Status:  Discontinued        3.375 g 12.5 mL/hr over 240 Minutes Intravenous Every 8 hours 04/11/22 1143 04/15/22 1044   04/05/22 1600  piperacillin-tazobactam (ZOSYN) IVPB 3.375 g        3.375 g 12.5 mL/hr over 240 Minutes Intravenous Every 8 hours 04/05/22 1222 04/10/22 1230   03/31/22 1045  cefoTEtan (CEFOTAN) 2 g in sodium chloride 0.9 % 100 mL IVPB        2 g 200 mL/hr over 30 Minutes Intravenous On call to O.R. 03/31/22 0959 03/31/22 1225   03/29/22 0800  piperacillin-tazobactam (ZOSYN) IVPB 3.375 g  Status:  Discontinued        3.375 g 12.5 mL/hr over 240 Minutes Intravenous Every 8 hours 03/29/22 0746 04/05/22 0826        Assessment/Plan  POD#16 s/p robotic lower anterior rectosigmoid resection with colostomy and drainage of pelvic abscess by Dr. 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 - WOC following for new ostomy - overall doing well with it   FEN: wean TPN, soft diet  ID: zosyn 12/31>1/5; Zosyn 1/6>1/10; unasyn 1/10>> VTE: LMWH   - below per TRH -  Chronic diastolic HF Hypothyroidism Pulmonary fibrosis HTN    LOS: 18 days    Norm Parcel, [facility] 04/16/2022, 11:11 AM Please see Amion for pager number during day hours 7:00am-4:30pm

## 2022-04-16 NOTE — Progress Notes (Signed)
PROGRESS NOTE    Alexandria [name]  AJO:[id number]  DOB: 1945/10/06  DOA: 03/28/2022 PCP: [name], DO Outpatient Specialists:   Hospital course:  77 y/o with a history of hypothyroidism, grade I DD, HTN, IPF, diverticulosis, diverticulitis, DVT, chronic hypokalemia who has a long history of constipation and has recently tried Linzess but stopped after 2 weeks due to lack of efficacy by her report. She recently got CT abd/pelvis 03/20/22 which revealed an enhancing lesion/mass just below the sigmoid-rectal junction. Flex Sig was performed 03/20/22 which revealed a polypoid mass at the sigmoid-rectal junction. Bx was readout by pathology as an inflammatory poly w/o dysplasia or malignancy. Patient had MRI abd/pelvis 03/26/22 -showed severe sigmoid diverticulitis.  Due to continued constipation and diffuse abdominal pain, worst in the LUQ, she presented to [facility] for evaluation. General surgery was consulted. Patient underwent repeat CT abd/pelvis and thought to have complicated diverticulitis with abscess. Unable to drain per IR.  Ultimately, she underwent rectosigmoid resection with colostomy by Dr. [name] 12/26.    1/6, recurrence of fever and worsening abdominal pain.  Bleeding from the rectum.  Repeat CT scan consistent with collection of abscess, suspected leakage from the stump. 1/7, IR drainage of intra-abdominal abscess.  Pain level is improving. 1/9, NG tube clamping trial and NG tube out.  Starting on clears.  Still not much ostomy output.   Subjective:  Patient is without any new complaint.  Mostly she states she is ready to go home however understands the need to stay.  Denies any pain.   Objective: Vitals:   04/15/22 1327 04/15/22 2038 04/16/22 0500 04/16/22 0501  BP: (!) 147/75 (!) 150/71  (!) 165/75  Pulse: 76 76  74  Resp: '16 16  18  '$ Temp: 98.6 F (37 C) 98.3 F (36.8 C)  98 F (36.7 C)  TempSrc: Oral Oral  Oral  SpO2: 96% 99%  96%  Weight:   68.8  kg   Height:        Intake/Output Summary (Last 24 hours) at 04/16/2022 1251 Last data filed at 04/16/2022 1000 Gross per 24 hour  Intake 1976.86 ml  Output 25 ml  Net 1951.86 ml   Filed Weights   04/11/22 0500 04/12/22 0600 04/16/22 0500  Weight: 69 kg 70.1 kg 68.8 kg     Exam:  General: Patient lying in bed in NAD discussing plan with Occupational Therapy at bedside. Eyes: sclera anicteric, conjuctiva mild injection bilaterally CVS: S1-S2, regular  Respiratory: No adventitious sounds GI: NABS, minimal tenderness to palpation diffusely, colostomy looks clean. LE: Warm and well-perfused Neuro: A/O x 3,  grossly nonfocal.  Psych: patient is logical and coherent, judgement and insight appear normal, mood and affect appropriate to situation.  Data Reviewed:  Basic Metabolic Panel: Recent Labs  Lab 04/10/22 0221 04/11/22 0411 04/11/22 9470 04/12/22 0302 04/13/22 0229 04/14/22 0330 04/15/22 0414 04/16/22 0333  NA 137   < >  --  138 136 135 133* 138  K 3.4*   < >  --  3.8 3.9 3.8 3.9 4.0  CL 103   < >  --  107 105 104 102 107  CO2 27   < >  --  '26 24 25 25 26  '$ GLUCOSE 160*   < >  --  110* 109* 104* 118* 124*  BUN 10   < >  --  '12 13 11 14 12  '$ CREATININE 0.77   < >  --  0.54 0.55 0.53  0.59 0.56  CALCIUM 7.6*   < >  --  7.7* 7.8* 8.0* 7.9* 7.9*  MG 2.3  --   --   --  1.9 2.2  --  2.5*  PHOS 2.2*  --  2.4* 3.0 3.2  --   --  2.7   < > = values in this interval not displayed.    CBC: Recent Labs  Lab 04/11/22 0411 04/11/22 1253 04/13/22 1130 04/13/22 2247 04/14/22 0330 04/15/22 0414 04/16/22 0333  WBC 11.7*  --  10.9*  --  11.2* 8.4 7.1  NEUTROABS  --   --   --   --  6.9 4.5 3.3  HGB 6.8*   < > 6.5* 9.3* 9.6* 9.6* 9.3*  HCT 22.0*   < > 21.2* 28.3* 29.5* 29.9* 29.2*  MCV 102.3*  --  105.5*  --  97.4 99.0 100.3*  PLT 411*  --  363  --  339 313 288   < > = values in this interval not displayed.     Scheduled Meds:  Chlorhexidine Gluconate Cloth  6 each  Topical Daily   feeding supplement  1 Container Oral TID BM   feeding supplement  237 mL Oral BID BM   fentaNYL  1 patch Transdermal Q72H   levothyroxine  84 mcg Intravenous Daily   lidocaine  1 patch Transdermal Q24H   polyethylene glycol  17 g Oral Daily   sodium chloride flush  10-40 mL Intracatheter Q12H   sodium chloride flush  10-40 mL Intracatheter Q12H   sodium chloride flush  3 mL Intravenous Q12H   sodium chloride flush  5 mL Per Tube Q8H   Continuous Infusions:  sodium chloride     sodium chloride 10 mL/hr at 04/13/22 1306   ampicillin-sulbactam (UNASYN) IV 3 g (04/16/22 1200)   ondansetron (ZOFRAN) IV     promethazine (PHENERGAN) injection (IM or IVPB) 200 mL/hr at 04/10/22 1720   TPN ADULT (ION) 75 mL/hr at 04/15/22 1722   TPN ADULT (ION)       Assessment & Plan:   Pelvic abscess secondary to complicated sigmoid diverticulitis,  Persistent ileus S/p colostomy and drainage of pelvic abscess, POD #17 Patient appears to be doing much better, patient afebrile, leukocytosis has normalized Patient is on Unasyn for E faecalis and bacterial rabies from intra-abdominal IntraOp culture. Patient will need to be discharged on Augmentin when she is ready Is is more active than she has been, generally feeling better Pain is under reasonable control on fentanyl patch Patient is still on TPN but is transitioning over to soft solid diet per general surgery  ABLA H&H are stable after 4 unit PRBC over the last 2 weeks Blood loss thought to be secondary to surgery and rectal bleeding Patient can be started on outpatient iron when appropriate, baseline hemoglobin is 13  Rectal bleeding Continues to have scant intermittent bleeding and secretions from rectum Continue conservative management  Hospital-acquired delirium Resolved  Chronic HFpEF Euvolemic at present, diuresis being held  Hypothyroidism Restart oral Synthroid when she is able to reliably absorb it via her  gut Continue IV Synthroid for now  Idiopathic pulmonary fibrosis No change in baseline respiratory status    DVT prophylaxis: SCD Code Status: Full Family Communication: None today   Studies: No results found.  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

## 2022-04-16 NOTE — Progress Notes (Signed)
Occupational Therapy Treatment Patient Details Name: Alexandria [name] MRN: [id number] DOB: 11/21/1945 Today's Date: 04/16/2022   History of present illness 77 y/o female presented with continued constipation and diffuse abdominal pain, worst in the LUQ. Now s/p robotic lower anterior rectosigmoid resection with colostomy and drainage of pelvic abscess 03/31/22.  Plan for transgluteal pelvic drain placement on 04/12/22.  PHMx:hypothyroidism, grade I DD, HTN, IPF, diverticulosis, diverticulitis, DVT, chronic hypokalemia   OT comments  Patient demonstrates modified independence with ADLs today - donning socks, performing toileting, standing at sink for grooming. She reports bathing herself this morning. She has assistance of husband and daughter at discharge if needed. No further OT needs at this time.    Recommendations for follow up therapy are one component of a multi-disciplinary discharge planning process, led by the attending physician.  Recommendations may be updated based on patient status, additional functional criteria and insurance authorization.    Follow Up Recommendations  No OT follow up     Assistance Recommended at Discharge PRN  Patient can return home with the following  Assistance with cooking/housework;Help with stairs or ramp for entrance   Equipment Recommendations  None recommended by OT    Recommendations for Other Services      Precautions / Restrictions Precautions Precaution Comments: colcotomy, JP drain on R side; bloody rectal leakage-use pads Restrictions Weight Bearing Restrictions: No       Mobility Bed Mobility Overal bed mobility: Modified Independent       Supine to sit: Modified independent (Device/Increase time)     General bed mobility comments: Mod I with use of walker to ambulate in room.    Transfers                         Balance Overall balance assessment: Mild deficits observed, not formally tested                                          ADL either performed or assessed with clinical judgement   ADL Overall ADL's : Modified independent                                       General ADL Comments: Patient reports bathing herself this morning. She demonstrated ability to perform toileting by herself and reports she knows how to manage colostomy as well. She demononstrated ability to don LB clothing and stand at sink for grooming.    Extremity/Trunk Assessment Upper Extremity Assessment Upper Extremity Assessment: Overall WFL for tasks assessed   Lower Extremity Assessment Lower Extremity Assessment: Defer to PT evaluation   Cervical / Trunk Assessment Cervical / Trunk Assessment: Normal    Vision Patient Visual Report: No change from baseline     Perception     Praxis      Cognition Arousal/Alertness: Awake/alert Behavior During Therapy: WFL for tasks assessed/performed Overall Cognitive Status: Within Functional Limits for tasks assessed                                          Exercises      Shoulder Instructions       General Comments  Pertinent Vitals/ Pain       Pain Assessment Pain Assessment: Faces Faces Pain Scale: Hurts a little bit Pain Location: abdomen and R hip Pain Descriptors / Indicators: Sore Pain Intervention(s): RN gave pain meds during session  Home Living                                          Prior Functioning/Environment              Frequency           Progress Toward Goals  OT Goals(current goals can now be found in the care plan section)  Progress towards OT goals: Goals met/education completed, patient discharged from [place] All goals met and education completed, patient discharged from OT services    Co-evaluation                 AM-PAC OT "6 Clicks" Daily Activity     Outcome Measure   Help from another person eating meals?: None Help  from another person taking care of personal grooming?: None Help from another person toileting, which includes using toliet, bedpan, or urinal?: None Help from another person bathing (including washing, rinsing, drying)?: None Help from another person to put on and taking off regular upper body clothing?: None Help from another person to put on and taking off regular lower body clothing?: None 6 Click Score: 24    End of Session Equipment Utilized During Treatment: Rolling walker (2 wheels)  OT Visit Diagnosis: Unsteadiness on feet (R26.81);Other abnormalities of gait and mobility (R26.89);Muscle weakness (generalized) (M62.81)   Activity Tolerance Patient tolerated treatment well   Patient Left with call bell/phone within reach;in bed;with bed alarm set   Nurse Communication Mobility status        Time: 6195-0932 OT Time Calculation (min): 23 min  Charges: OT General Charges $OT Visit: 1 Visit OT Treatments $Self Care/Home Management : 23-37 mins  Gustavo Lah, OTR/L [place]  Office [phone]   Lenward Chancellor 04/16/2022, 12:26 PM

## 2022-04-16 NOTE — Progress Notes (Signed)
PHARMACY - TOTAL PARENTERAL NUTRITION CONSULT NOTE   Indication: Prolonged ileus  Patient Measurements: Height: '5\' 4"'$  (162.6 cm) Weight: 68.8 kg (151 lb 10.8 oz) IBW/kg (Calculated) : 54.7 TPN AdjBW (KG): 63 Body mass index is 26.04 kg/m. Usual Weight:   Assessment:  Pharmacy is consulted to dose TPN in 77 yo female with prolonged ileus. Pt  is s/p robotic lower anterior rectosigmoid resection with colostomy and drainage of pelvic abscess by Dr. [name] 12/26.   Glucose / Insulin:  No history of DM, A1c 5.4 (03/18/20).  - CBGs: at goal < 150 - sSSI: 2 units / 24 hrs Electrolytes: all WNL except Mg rising; now slightly elevated Renal: SCr, BUN stable WNL; UOP not charted Hepatic: LFTs, Tbili WNL; albumin remains low but improved (1/11) - TG stable WNL (1/8) I/O: Not completely recorded; 1/6 CT shows anasarca - MIVF: NS @ KVO; PRBC transfusion 04/11/22, 04/13/22 - Output: UOP uncharted; minimal drain OP GI Imaging: - 53/64: complicated sigmoid diverticulitis. Distal sigmoid colon shows wall thickening and adjacent inflammation. - 1/1 CT: decreasing residual pelvic abscess, ileus, ostomy, distended gallbladder, small left pleural effusion w/ pulm fibrosis - 1/6 CT: pelvic abscess, concern for stump leak GI Surgeries / Procedures:  - 12/26: robotic lower anterior rectosigmoid resection with colostomy and drainage of pelvic abscess  - 1/7 IR transgluteal pelvic drain placement  Central access:  1/4: Replaced PICC TPN start date: 12/30-1/2, restarted 1/4   Nutritional Goals: Goal TPN rate is 75 mL/hr (provides 90 g of protein and 1818 kcals per day)  RD Assessment: Estimated Needs Total Energy Estimated Needs: 1700-1900 kcals Total Protein Estimated Needs: 85-100 g Total Fluid Estimated Needs: >/= 1.7 L  Current Nutrition:  TPN FLD 1/10 Boost Breeze TID, Ensure BID (refusing both)  Plan:  Continue TPN at 75 mL/hr, goal rate  Electrolytes in TPN: no changes Na 140 mEq/L K 65  mEq/L  Ca 5 mEq/L  Mg 5 mEq/L  Phos 25 mmol/L  Cl:Ac 1:1 Continue standard MVI and trace elements in TPN Currently no insulin needs - stop scheduled SSI/CBGs until TPN titrated or starts taking PO MIVF per MD TPN lab panel Mon/Thurs and as needed Mag level tomorrow AM   Reuel Boom, PharmD, BCPS [phone] 04/16/2022, 7:49 AM

## 2022-04-17 DIAGNOSIS — J84112 Idiopathic pulmonary fibrosis: Secondary | ICD-10-CM

## 2022-04-17 DIAGNOSIS — D62 Acute posthemorrhagic anemia: Secondary | ICD-10-CM

## 2022-04-17 DIAGNOSIS — E039 Hypothyroidism, unspecified: Secondary | ICD-10-CM

## 2022-04-17 DIAGNOSIS — N739 Female pelvic inflammatory disease, unspecified: Secondary | ICD-10-CM

## 2022-04-17 DIAGNOSIS — G928 Other toxic encephalopathy: Secondary | ICD-10-CM

## 2022-04-17 DIAGNOSIS — I10 Essential (primary) hypertension: Secondary | ICD-10-CM

## 2022-04-17 DIAGNOSIS — K9189 Other postprocedural complications and disorders of digestive system: Secondary | ICD-10-CM

## 2022-04-17 DIAGNOSIS — I5032 Chronic diastolic (congestive) heart failure: Secondary | ICD-10-CM

## 2022-04-17 DIAGNOSIS — K567 Ileus, unspecified: Secondary | ICD-10-CM

## 2022-04-17 DIAGNOSIS — K5720 Diverticulitis of large intestine with perforation and abscess without bleeding: Principal | ICD-10-CM

## 2022-04-17 DIAGNOSIS — K625 Hemorrhage of anus and rectum: Secondary | ICD-10-CM

## 2022-04-17 LAB — CBC WITH DIFFERENTIAL/PLATELET
Abs Immature Granulocytes: 0.16 10*3/uL — ABNORMAL HIGH (ref 0.00–0.07)
Basophils Absolute: 0.1 10*3/uL (ref 0.0–0.1)
Basophils Relative: 2 %
Eosinophils Absolute: 0.8 10*3/uL — ABNORMAL HIGH (ref 0.0–0.5)
Eosinophils Relative: 11 %
HCT: 30.6 % — ABNORMAL LOW (ref 36.0–46.0)
Hemoglobin: 9.7 g/dL — ABNORMAL LOW (ref 12.0–15.0)
Immature Granulocytes: 2 %
Lymphocytes Relative: 19 %
Lymphs Abs: 1.4 10*3/uL (ref 0.7–4.0)
MCH: 32.6 pg (ref 26.0–34.0)
MCHC: 31.7 g/dL (ref 30.0–36.0)
MCV: 102.7 fL — ABNORMAL HIGH (ref 80.0–100.0)
Monocytes Absolute: 1.7 10*3/uL — ABNORMAL HIGH (ref 0.1–1.0)
Monocytes Relative: 24 %
Neutro Abs: 3 10*3/uL (ref 1.7–7.7)
Neutrophils Relative %: 42 %
Platelets: 303 10*3/uL (ref 150–400)
RBC: 2.98 MIL/uL — ABNORMAL LOW (ref 3.87–5.11)
RDW: 16.5 % — ABNORMAL HIGH (ref 11.5–15.5)
WBC: 7.2 10*3/uL (ref 4.0–10.5)
nRBC: 0 % (ref 0.0–0.2)

## 2022-04-17 LAB — COMPREHENSIVE METABOLIC PANEL
ALT: 28 U/L (ref 0–44)
AST: 33 U/L (ref 15–41)
Albumin: 2.1 g/dL — ABNORMAL LOW (ref 3.5–5.0)
Alkaline Phosphatase: 60 U/L (ref 38–126)
Anion gap: 7 (ref 5–15)
BUN: 14 mg/dL (ref 8–23)
CO2: 26 mmol/L (ref 22–32)
Calcium: 8 mg/dL — ABNORMAL LOW (ref 8.9–10.3)
Chloride: 102 mmol/L (ref 98–111)
Creatinine, Ser: 0.54 mg/dL (ref 0.44–1.00)
GFR, Estimated: >60 mL/min (ref >60)
Glucose, Bld: 100 mg/dL — ABNORMAL HIGH (ref 70–99)
Potassium: 4.3 mmol/L (ref 3.5–5.1)
Sodium: 135 mmol/L (ref 135–145)
Total Bilirubin: 0.3 mg/dL (ref 0.3–1.2)
Total Protein: 6.2 g/dL — ABNORMAL LOW (ref 6.5–8.1)

## 2022-04-17 LAB — MAGNESIUM: Magnesium: 2.1 mg/dL (ref 1.7–2.4)

## 2022-04-17 MED ORDER — LEVOTHYROXINE SODIUM 112 MCG PO TABS
112.0000 ug | ORAL_TABLET | Freq: Every day | ORAL | Status: DC
  Administered 2022-04-18 – 2022-04-21 (×4): 112 ug via ORAL
  Filled 2022-04-17 (×4): qty 1

## 2022-04-17 MED ORDER — TRAVASOL 10 % IV SOLN
INTRAVENOUS | Status: DC
  Filled 2022-04-17: qty 480

## 2022-04-17 MED ORDER — HYDRALAZINE HCL 20 MG/ML IJ SOLN: 10.0000 mg | Freq: Four times a day (QID) | INTRAMUSCULAR | Status: DC | PRN

## 2022-04-17 NOTE — Progress Notes (Signed)
PHARMACY - TOTAL PARENTERAL NUTRITION CONSULT NOTE   Indication: Prolonged ileus  Patient Measurements: Height: '5\' 4"'$  (162.6 cm) Weight: 72.6 kg (160 lb) IBW/kg (Calculated) : 54.7 TPN AdjBW (KG): 63 Body mass index is 27.46 kg/m. Usual Weight:   Assessment:  Pharmacy is consulted to dose TPN in 77 yo female with prolonged ileus. Pt  is s/p robotic lower anterior rectosigmoid resection with colostomy and drainage of pelvic abscess by Dr. [name] 12/26.   Glucose / Insulin:  No history of DM, A1c 5.4 (03/18/20).  - CBGs/SSI checks stopped yesterday due to minimal usage Electrolytes: all WNL Renal: SCr, BUN stable WNL; UOP not charted Hepatic: LFTs, Tbili WNL; albumin remains low but improved (1/11) - TG stable WNL (1/8) I/O: Not completely recorded; 1/6 CT shows anasarca - MIVF: NS @ KVO; PRBC transfusion 04/11/22, 04/13/22 - Output: UOP uncharted; minimal drain OP GI Imaging: - 03/83: complicated sigmoid diverticulitis. Distal sigmoid colon shows wall thickening and adjacent inflammation. - 1/1 CT: decreasing residual pelvic abscess, ileus, ostomy, distended gallbladder, small left pleural effusion w/ pulm fibrosis - 1/6 CT: pelvic abscess, concern for stump leak GI Surgeries / Procedures:  - 12/26: robotic lower anterior rectosigmoid resection with colostomy and drainage of pelvic abscess  - 1/7 IR transgluteal pelvic drain placement  Central access:  1/4: Replaced PICC TPN start date: 12/30-1/2, restarted 1/4   Nutritional Goals: Goal TPN rate is 75 mL/hr (provides 90 g of protein and 1818 kcals per day)  RD Assessment: Estimated Needs Total Energy Estimated Needs: 1700-1900 kcals Total Protein Estimated Needs: 85-100 g Total Fluid Estimated Needs: >/= 1.7 L  Current Nutrition:  TPN Soft diet 1/11 Boost Breeze TID, Ensure BID (refusing both)  Plan:  Decrease TPN to 40 mL/hr, ok to wean per CCS instructions on 1/11  Electrolytes in TPN: no changes Na 140 mEq/L K 65  mEq/L  Ca 5 mEq/L  Mg 5 mEq/L  Phos 25 mmol/L  Cl:Ac 1:1 Continue standard MVI and trace elements in TPN No CBGs/SSI MIVF per MD TPN lab panel Mon/Thurs and as needed   Peggyann Juba, PharmD, BCPS Pharmacy: [phone] 04/17/2022, 7:12 AM

## 2022-04-17 NOTE — Assessment & Plan Note (Signed)
No clinical evidence of cardiogenic volume overload Strict input and output monitoring Daily weights

## 2022-04-17 NOTE — Progress Notes (Signed)
PROGRESS NOTE   Alexandria [name]  XMI:[id number] DOB: September 21, 1945 DOA: 03/28/2022 PCP: [name], DO   Date of Service: the patient was seen and examined on 04/17/2022  Brief Narrative:  77 year old female with past medical history of hypothyroidism, chronic diastolic ingestive heart failure (echo 06/2020 EF 60-65% with G1DD), idiopathic pulmonary fibrosis (follows with Dr. [name] at [facility]), hypertension, diverticulosis and recent identification of a rectosigmoid mass presenting with abdominal pain and constipation.    Of note,  CT 03/20/2022 status post flexible sigmoidoscopy performed that same day by Dr. [name] with initial biopsy revealing no dysplasia or malignancy.  Patient had a follow-up MRI 12/21 revealing severe sigmoid diverticulosis with concentric wall thickening involving the distal sigmoid colon and upper rectum and adjacent 3.6 cm multiloculated fluid collection favoring diverticulitis and diverticular abscess (but not read until 12/26).  Upon eventual presentation to [facility] emergency department on 12/23 patient reported not having a bowel movement for 7 days.  At that time, MRI results were not yet available and patient was hospitalized for severe constipation.  Hospitalist group was called and patient was admitted to the hospital.  Dr. [name] and Dr. [name] with general surgery was consulted.  CT imaging of the abdomen pelvis performed on 12/24 in addition to 12/21 MRI seems consistent with peridiverticular abscess formation.  Patient was placed on intravenous antibiotic therapy at that time.  Interventional radiology was consulted but unfortunately drainage could not be performed.  Patient eventually underwent rectosigmoid resection with colostomy performed by Dr. [name] on 03/31/2022 with continued antibiotics afterwards.  On 04/11/2022 patient began to develop recurrence of fever and worsening abdominal pain with rectal bleeding.  Repeat CT imaging  consistent with new abscess formation measuring 8.9 x 8.1 x 6.7 cm with concern for leakage from the stump.  On 04/12/2022 patient underwent IR guided drainage of the intra-abdominal abscess.  Cultures from 1/7 abscess drainage growing out Enterococcus faecalis.  Hospitalization is also been complicated by postoperative ileus with requiring temporary use of TPN.  Patient has since been advanced to a soft diet as TPN is being weaned.  Patient also received a 2 unit packed red blood cell transfusion on 1/8.     Assessment and Plan: Diverticulitis of large intestine with perforation and abscess CT imaging of abdomen and pelvis performed 12/24 as well as 12/21 MRI consistent with peridiverticular abscess formation Patient status post rectosigmoid resection with colostomy performed by Dr. [name] on 12/26 Patient additionally developed new abscess formation in the pelvis identified on repeat CT imaging 1/6 status post IR guided drainage placement 1/7. General surgery following daily, their input is appreciated Interventional radiology additionally following daily for management of patient's pelvic abscess drain, their input is also appreciated. Cultures from 1/7 growing Enterococcus faecalis Currently treating with Unasyn until at least 1/14  Pelvic abscess in female Please see assessment and plan above  Ileus following gastrointestinal surgery ([place]) Prolonged postoperative ileus and inability to take p.o. intake prompting initiation of TPN Pharmacy following daily for management TPN their input is appreciated Patient has been placed back on a soft diet.  As patient increases p.o. intake, weaning TPN Monitoring electrolytes closely and replacing as necessary  Toxic metabolic encephalopathy Prolonged course of multifactorial delirium thought to be secondary to prolonged hospitalization, infection and polypharmacy This has since resolved  Acute blood loss anemia Patient received a total of 4  units of packed red blood cells transfused during this hospitalization with 2 units transfused on 1/8 No  clinical evidence of bleeding currently with exception of scant amount of blood in the patient's percutaneous drain No active rectal bleeding Monitoring hemoglobin and hematocrit with serial CBCs  Chronic diastolic CHF (congestive heart failure) (HCC) No clinical evidence of cardiogenic volume overload Strict input and output monitoring Daily weights    Idiopathic pulmonary fibrosis (HCC) Longstanding known history, follows with Dr. [name] at [facility] No active symptoms Outpatient follow up  Essential hypertension Relaxed blood pressure targets due to advanced age As needed intravenous antihypertensives for markedly elevated blood pressure.  Rectal bleeding Intermittently occurring earlier in the hospitalization which has since stopped Hemoglobin and hematocrit stable  Hypothyroidism Resume home regimen of Synthroid   Chronic venous insufficiency Stable. NO leg swelling or lesions  Allergic reaction caused by a drug-resolved as of 04/17/2022 Patient reports severe itching after pain medication: MS vs dilaudid (both given in ED. )  Plan Benadryl 25 mg q6 prn  H2 blocker bid prn  Avoid dilaudid, MS    Subjective:  Patient continues to complain of abdominal pain, moderate in intensity.  Pain is improved compared to prior days but is still somewhat diffuse.  Patient states that she is tolerating some oral intake although her appetite is rather poor.  Physical Exam:  Vitals:   04/16/22 2103 04/17/22 0441 04/17/22 0455 04/17/22 1354  BP: (!) 153/65 (!) 159/73  (!) 169/81  Pulse: 74 76  70  Resp: '18 18  16  '$ Temp: 98.4 F (36.9 C) 98.3 F (36.8 C)  98.2 F (36.8 C)  TempSrc: Oral Oral  Oral  SpO2: 94% 95%  96%  Weight:   72.6 kg   Height:        Constitutional: Awake alert and oriented x3, no associated distress.   Skin: no rashes, no  lesions, good skin turgor noted. Eyes: Pupils are equally reactive to light.  No evidence of scleral icterus or conjunctival pallor.  ENMT: Moist mucous membranes noted.  Posterior pharynx clear of any exudate or lesions.   Respiratory: Notable by basilar rales, worse in the right field.  No evidence of wheezing.  Normal respiratory effort. No accessory muscle use.  Cardiovascular: Regular rate and rhythm, no murmurs / rubs / gallops. No extremity edema. 2+ pedal pulses. No carotid bruits.  Abdomen: Abdomen is diffusely tender but soft.  Ostomy noted to be in place with pink mucosa and dark brown stool.  Notable percutaneous JP drain extending from the pelvis with serosanguineous drainage.  Hypoactive sounds noted. Musculoskeletal: No joint deformity upper and lower extremities. Good ROM, no contractures.  Normal motor tone.   Data Reviewed:  I have personally reviewed and interpreted labs, imaging.  Significant findings are   CBC: Recent Labs  Lab 04/13/22 1130 04/13/22 2247 04/14/22 0330 04/15/22 0414 04/16/22 0333 04/17/22 0342  WBC 10.9*  --  11.2* 8.4 7.1 7.2  NEUTROABS  --   --  6.9 4.5 3.3 3.0  HGB 6.5* 9.3* 9.6* 9.6* 9.3* 9.7*  HCT 21.2* 28.3* 29.5* 29.9* 29.2* 30.6*  MCV 105.5*  --  97.4 99.0 100.3* 102.7*  PLT 363  --  339 313 288 938   Basic Metabolic Panel: Recent Labs  Lab 04/11/22 0849 04/12/22 0302 04/13/22 0229 04/14/22 0330 04/15/22 0414 04/16/22 0333 04/17/22 0342  NA  --  138 136 135 133* 138 135  K  --  3.8 3.9 3.8 3.9 4.0 4.3  CL  --  107 105 104 102 107 102  CO2  --  $'26 24 25 25 26 26  'x$ GLUCOSE  --  110* 109* 104* 118* 124* 100*  BUN  --  '12 13 11 14 12 14  '$ CREATININE  --  0.54 0.55 0.53 0.59 0.56 0.54  CALCIUM  --  7.7* 7.8* 8.0* 7.9* 7.9* 8.0*  MG  --   --  1.9 2.2  --  2.5* 2.1  PHOS 2.4* 3.0 3.2  --   --  2.7  --    GFR: Estimated Creatinine Clearance: 57.5 mL/min (by C-G formula based on SCr of 0.54 mg/dL). Liver Function Tests: Recent  Labs  Lab 04/13/22 0229 04/16/22 0333 04/17/22 0342  AST 31 33 33  ALT '20 24 28  '$ ALKPHOS 52 56 60  BILITOT 0.5 0.4 0.3  PROT 5.4* 6.2* 6.2*  ALBUMIN 1.7* 2.0* 2.1*     Code Status:  Full code.  Code status decision has been confirmed with: patient Family Communication: Plan of care discussed with daughter and husband who are both at the bedside.   Severity of Illness:  The appropriate patient status for this patient is INPATIENT. Inpatient status is judged to be reasonable and necessary in order to provide the required intensity of service to ensure the patient's safety. The patient's presenting symptoms, physical exam findings, and initial radiographic and laboratory data in the context of their chronic comorbidities is felt to place them at high risk for further clinical deterioration. Furthermore, it is not anticipated that the patient will be medically stable for discharge from the hospital within 2 midnights of admission.   * I certify that at the point of admission it is my clinical judgment that the patient will require inpatient hospital care spanning beyond 2 midnights from the point of admission due to high intensity of service, high risk for further deterioration and high frequency of surveillance required.*  Time spent:  60 minutes  Author:  Vernelle Emerald MD  04/17/2022 7:52 PM

## 2022-04-17 NOTE — Assessment & Plan Note (Signed)
· 

## 2022-04-17 NOTE — Assessment & Plan Note (Signed)
Prolonged course of multifactorial delirium thought to be secondary to prolonged hospitalization, infection and polypharmacy This has since resolved

## 2022-04-17 NOTE — Assessment & Plan Note (Signed)
Intermittently occurring earlier in the hospitalization which has since stopped Hemoglobin and hematocrit stable

## 2022-04-17 NOTE — Assessment & Plan Note (Addendum)
CT imaging of abdomen and pelvis performed 12/24 as well as 12/21 MRI consistent with peridiverticular abscess formation Patient status post rectosigmoid resection with colostomy performed by Dr. [name] on 12/26 Patient additionally developed new abscess formation in the pelvis identified on repeat CT imaging 1/6 status post IR guided drainage placement 1/7. Repeat CT imaging of abdomen and pelvis performed today revealing improved but still persisting abscess of the posterior pelvis measuring 5.7 x 5.1 x 4.2 cm. General surgery, interventional radiology, infectious disease are following daily, their input is appreciated.   Cultures from 1/7 growing Enterococcus faecalis Continue to treat with intravenous Unasyn for now with likely need for several weeks of oral antibiotic therapy after intravenous antibiotics are complete.   Based on continued clinical improvement anticipate discharge in the next few days.

## 2022-04-17 NOTE — Hospital Course (Addendum)
77 year old female with past medical history of hypothyroidism, chronic diastolic ingestive heart failure (echo 06/2020 EF 60-65% with G1DD), idiopathic pulmonary fibrosis (follows with Dr. [name] at [facility]), hypertension, diverticulosis and recent identification of a rectosigmoid mass presenting with abdominal pain and constipation.    Of note,  CT 03/20/2022 status post flexible sigmoidoscopy performed that same day by Dr. [name] with initial biopsy revealing no dysplasia or malignancy.  Patient had a follow-up MRI 12/21 revealing severe sigmoid diverticulosis with concentric wall thickening involving the distal sigmoid colon and upper rectum and adjacent 3.6 cm multiloculated fluid collection favoring diverticulitis and diverticular abscess (but not read until 12/26).  Upon eventual presentation to [facility] emergency department on 12/23 patient reported not having a bowel movement for 7 days.  At that time, MRI results were not yet available and patient was hospitalized for severe constipation.  Hospitalist group was called and patient was admitted to the hospital.  Dr. [name] and Dr. [name] with general surgery was consulted.  CT imaging of the abdomen pelvis performed on 12/24 in addition to 12/21 MRI seems consistent with peridiverticular abscess formation.  Patient was placed on intravenous antibiotic therapy at that time.  Interventional radiology was consulted but unfortunately drainage could not be performed.  Patient eventually underwent rectosigmoid resection with colostomy performed by Dr. [name] on 03/31/2022 with continued antibiotics afterwards.  On 04/11/2022 patient began to develop recurrence of fever and worsening abdominal pain with rectal bleeding.  Repeat CT imaging consistent with new abscess formation measuring 8.9 x 8.1 x 6.7 cm with concern for leakage from the stump.  On 04/12/2022 patient underwent IR guided drainage of the intra-abdominal abscess.  Cultures from 1/7 abscess  drainage growing out Enterococcus faecalis.  Hospitalization is also been complicated by postoperative ileus with requiring temporary use of TPN.  This has since been discontinued on 1/13.  Patient also received a total of 2 units of transfused packed red blood cells throughout the hospitalization for recurrent anemia with last transfusion being on 1/8.    Patient slowly improved throughout the hospitalization with intravenous antibiotics.  Repeat CT imaging performed on 04/20/2022 revealing abscesses now measuring 5.7 x 5.1 x 4.2 cm in the posterior pelvis adjacent to the rectal stump with discernible reduction in size compared to prior.  Based on this information infectious disease recommended a total of 6 weeks of oral Augmentin from the time of drain placement.  Patient was taught about wound care and ostomy care and arranges were made for the patient to be discharged in improved and stable condition on 04/22/2022 with follow-up with infectious disease, general surgery and interventional radiology.

## 2022-04-17 NOTE — TOC Initial Note (Signed)
Transition of Care [facility]) - Initial/Assessment Note    Patient Details  Name: Alexandria [name] MRN: [id number] Date of Birth: 06-02-1945  Transition of Care [facility]) CM/SW Contact:    Henrietta Dine, RN Phone Number: 04/17/2022, 1:32 PM  Clinical Narrative:                 [facility] consult for d/c.needs; spoke w/ pt, husband and dtr in room; her POC is dtr Nona Dell [phone]); pt says she is from home and plans to return at d/c; the pt says she has glasses, cane, shower chair, and shower bars; she says she does not receive [place] services or have home oxygen; pt says she does not have dentures or HA; she also denies IPV, food insecurity, or paying utilities; pt agrees to [facility] services and DME; spoke w/ Erasmo Downer at [facility] and they will deliver [place] w/ seat to pt's room; spoke w/ Levada Dy at [facility] and the agency can provide [facility] services; TOC will con't to follow.  Expected Discharge Plan: [place] Barriers to Discharge: Continued Medical Work up   Patient Goals and CMS Choice Patient states their goals for this hospitalization and ongoing recovery are:: home with family          Expected Discharge Plan and Services In-house Referral: Clinical Social Work Discharge Planning Services: CM Consult Post Acute Care Choice: Home Health, Durable Medical Equipment Living arrangements for the past 2 months: [place]                 DME Arranged: [place] rolling with seat   Date DME Agency Contacted: 04/17/22 Time DME Agency Contacted: 81 Representative spoke with at DME Agency: Erasmo Downer            Prior Living Arrangements/Services Living arrangements for the past 2 months: [place] with:: Self Patient language and need for interpreter reviewed:: Yes Do you feel safe going back to the place where you live?: Yes      Need for Family Participation in Patient Care: Yes (Comment) Care giver support system in place?: Yes (comment)   Criminal  Activity/Legal Involvement Pertinent to Current Situation/Hospitalization: No - Comment as needed  Activities of Daily Living Home Assistive Devices/Equipment: None ADL Screening (condition at time of admission) Patient's cognitive ability adequate to safely complete daily activities?: Yes Is the patient deaf or have difficulty hearing?: No Does the patient have difficulty seeing, even when wearing glasses/contacts?: No Does the patient have difficulty concentrating, remembering, or making decisions?: No Patient able to express need for assistance with ADLs?: Yes Does the patient have difficulty dressing or bathing?: No Independently performs ADLs?: Yes (appropriate for developmental age) Does the patient have difficulty walking or climbing stairs?: No Weakness of Legs: None Weakness of Arms/Hands: None  Permission Sought/Granted Permission sought to share information with : Case Manager Permission granted to share information with : Yes, Verbal Permission Granted  Share Information with NAME: Lenor Coffin, RN, CM     Permission granted to share info w Relationship: son  Permission granted to share info w Contact Information: [phone]  Emotional Assessment Appearance:: Appears stated age Attitude/Demeanor/Rapport: Gracious Affect (typically observed): Accepting Orientation: : Oriented to Self, Oriented to Place, Oriented to  Time, Oriented to Situation Alcohol / Substance Use: Not Applicable Psych Involvement: No (comment)  Admission diagnosis:  Other constipation [K59.09] Nausea [R11.0] Rectal mass [K62.89] Patient Active Problem List   Diagnosis Date Noted   Diverticular disease of large  intestine with complication 93/71/6967   Pelvic abscess in female 03/30/2022   Allergic reaction caused by a drug 03/29/2022   Flank pain 03/13/2022   Lower abdominal pain 03/13/2022   Abnormal urine findings 03/13/2022   Urinary frequency 01/02/2022   Light headed 01/02/2022    Flat foot 11/19/2021   Pain of right orbit 11/15/2021   S/P total knee arthroplasty, left 11/13/2021   Ankle impingement syndrome, left 11/13/2021   Rectal bleed 08/22/2021   Abnormal thyroid blood test 08/22/2021   Abnormal kidney function 08/22/2021   Bronchitis 07/18/2021   Dumping syndrome 03/13/2020   Hyperglycemia 03/13/2020   Arthritis    Bronchial pneumonia    Colon polyp    DVT (deep venous thrombosis) (HCC)    Heart murmur    Migraines    Pancreatitis    Thyroid disease    Aortic root dilatation ([place]) 11/24/2019   Leukocytosis 11/06/2019   Diarrhea 11/06/2019   Generalized abdominal pain 11/06/2019   Pre-ulcerative corn or callous 09/07/2019   Nausea 09/07/2019   Pain of left calf 05/09/2019   Tibial pain 05/09/2019   Essential hypertension 02/08/2019   Healthcare maintenance 11/10/2018   Bradycardia 07/22/2018   Pansinusitis 06/23/2018   History of transient ischemic attack (TIA) 03/24/2018   IPF (idiopathic pulmonary fibrosis) ([place]) 03/24/2018   Allergic rhinitis 02/16/2018   Eustachian tube dysfunction, bilateral 02/16/2018   URI (upper respiratory infection) 02/16/2018   Seasonal allergic rhinitis due to pollen 02/16/2018   ILD (interstitial lung disease) ([place]) 01/07/2018   Dyspnea 01/07/2018   Acute respiratory failure ([place]) 01/06/2018   TIA (transient ischemic attack) 01/06/2018   Chronic hypokalemia 12/15/2017   Chronic cough 11/26/2017   Basal cell carcinoma (BCC) of neck 06/07/2017   Anisometropia 06/02/2017   Drusen of macula of both eyes 06/02/2017   Photopsia of left eye 06/02/2017   Cardiac murmur 89/38/1017   Diastolic dysfunction 51/05/5850   Situational anxiety 05/07/2017   Headache 05/07/2017   Mitral and aortic insufficiency 05/07/2017   Cellulitis of left lower extremity 04/30/2017   Migraine without status migrainosus, not intractable 06/09/2016   Insomnia 11/18/2015   Normal coronary arteries 05/20/2015   Chest pain at rest  05/20/2015   RBBB 05/20/2015   Abnormal CXR 05/20/2015   Abnormal CT of the chest 05/20/2015   Hx of myocardial infarction 05/08/2015   Bruising 05/08/2015   Upper airway cough syndrome 02/14/2015   Asthmatic bronchitis with acute exacerbation 02/06/2015   Mild intermittent asthma without complication 77/82/4235   Oliguria 12/28/2014   Hypothyroidism 10/25/2014   Fatigue 10/25/2014   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   Chronic venous insufficiency 10/24/2014   Varicose veins of leg with complications 36/14/4315   Degeneration of intervertebral disc of cervical region 06/29/2014   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   Generalized osteoarthritis of multiple sites 06/29/2014   History of stroke without residual deficits 06/29/2014   Palpitations 11/08/2013   SOB (shortness of breath) 10/03/2013   DOE (dyspnea on exertion) 10/03/2013   Edema of both legs 10/03/2013   Edema of lower extremity 10/03/2013   Subacute maxillary sinusitis 06/17/2013   Hair loss 11/25/2012   Alopecia 11/25/2012   Diverticulitis 07/14/2012   Diverticulitis of intestine 07/14/2012   Hammer toe 06/13/2012   Recurrent abdominal pain 04/17/2012   Abdominal pain 01/06/2012   Hypoglycemia 01/06/2012   History of stress test 05/21/2011   Hx of echocardiogram 01/27/2010   H/O cardiac catheterization 2004   PCP:  [name],  DO Pharmacy:   [id number] [place], [place] - [address]Phone: [phone]: [phone] [facility] [place]Phone: [phone]: [phone] [address] [place]Phone: [phone]: [phone]    Social Determinants of Health (SDOH) Social  History: [place] No Food Insecurity (03/31/2022)  Housing: Low Risk  (03/31/2022)  Transportation Needs: No Transportation Needs (03/31/2022)  Utilities: Not At Risk (03/31/2022)  Depression (PHQ2-9): Low Risk  (02/10/2022)  Tobacco Use: Low Risk  (04/01/2022)   SDOH Interventions: Food Insecurity Interventions: Intervention Not Indicated Housing Interventions: Intervention Not Indicated Transportation Interventions: Intervention Not Indicated Utilities Interventions: Intervention Not Indicated   Readmission Risk Interventions    04/08/2022    2:29 PM 04/03/2022    8:29 AM  Readmission Risk Prevention Plan  Post Dischage Appt  Complete  Medication Screening  Complete  Transportation Screening Complete Complete  PCP or Specialist Appt within 5-7 Days Complete   Home Care Screening Complete   Medication Review (RN CM) Complete

## 2022-04-17 NOTE — Consult Note (Signed)
[place] Nurse ostomy follow up Stoma type/location: LLQ, colostomy Stomal assessment/size: 1 1/2" round, budded, pink, moist Peristomal assessment: intact, no irritation noted Treatment options for stomal/peristomal skin: 2" barrier ring Output soft brown/green Ostomy pouching: 1pc.soft convex with 2" skin barrier ring Education provided:  At this visit patient was able to demonstrate emptying of pouch with some cueing, used wick to clean spout and was able to use the lock and roll mechanism to open and close with just verbal cues.    This RN did discuss utilizing  a 2 piece ostomy pouching system to see if this would be easier for her at home.  Patient agreed.  Patient able to remove old pouch, clean around stoma with just water.  Patient did express concern about hurting stoma, educated patient that stoma did not have sensation and that she would not harm stoma by removing pouch or cleaning it.  Patient was able to cut her new skin barrier with verbal cues.  Patient placed 2" barrier ring around stoma then secured skin barrier.  Did demonstrate to patient how to snap pouch onto skin barrier.  Patient much more engaged this morning, appears very motivated to learn now and says after removal of  the NG tube she feels much better and more hopeful about discharge.  .    Did re-Educate patient on the importance of emptying pouch when 1/3 to 1/2 full and that she needs to be doing this independently in the hospital as much as possible.  Patient agreed.     Enrolled patient in [place] Start Discharge program: Yes   WOC will continue to follow this patient for further education and ostomy support.   Thank you,    Clemie General MSN, RN-BC, [employer]

## 2022-04-17 NOTE — Progress Notes (Signed)
OT Cancellation Note  Patient Details Name: DAJANAY NORTHRUP MRN: [id number] DOB: [birth year]   Cancelled Treatment:    Reason Eval/Treat Not Completed: OT screened, no needs identified, will sign off. Received OT order. Patient doing well with ADLs. Patient seen 1/11 and discharged from OT services due to being independent with ADLs. No OT needs.   Marikay Roads L Kinlie Janice 04/17/2022, 1:43 PM

## 2022-04-17 NOTE — Assessment & Plan Note (Signed)
Patient received a total of 4 units of packed red blood cells transfused during this hospitalization with 2 units transfused on 1/8 No clinical evidence of bleeding currently with exception of serosanguineous drainage in the patient's percutaneous drain No active rectal bleeding Hemoglobin and hematocrit remain stable. Monitoring hemoglobin and hematocrit with serial CBCs

## 2022-04-17 NOTE — Progress Notes (Signed)
Mobility Specialist - Progress Note   04/17/22 0939  Mobility  Activity Ambulated with assistance to bathroom;Ambulated with assistance in hallway  Level of Assistance Contact guard assist, steadying assist  Assistive Device Other (Comment) (rollator)  Distance Ambulated (ft) 130 ft  Range of Motion/Exercises Active  Activity Response Tolerated well  Mobility Referral Yes  $Mobility charge 1 Mobility   Pt was found in bed and agreeable to ambulate. Was assisted to the bathroom before ambulating in the hallway. At ~53f took x1 seated rest break for ~173m. At EOS was left in bed with all necessities in reach.  BrFerd Hibbsobility Specialist

## 2022-04-17 NOTE — Progress Notes (Signed)
Referring Physician(s): Toth,P  Supervising Physician: Sandi Mariscal  Patient Status:  [facility] - In-pt  Chief Complaint:  Pelvic/back pain, nausea, pelvic fluid collection  Subjective: "I'm exhausted. I just want to sleep" Agreeable to drain evaluation. No complaints related to L TG drain.   Allergies: Prednisone, Dilaudid [hydromorphone], Atrovent hfa [ipratropium bromide hfa], Cheratussin ac [guaifenesin-codeine], Diltiazem, Hydrocodone, Influenza vaccines, Morphine, Motrin [ibuprofen], Oxycodone, and Codeine  Medications: Prior to Admission medications   Medication Sig Start Date End Date Taking? Authorizing Provider  aspirin EC 81 MG tablet Take 81 mg by mouth daily. Swallow whole.   Yes [provider]  benzonatate (TESSALON) 100 MG capsule Take 200 mg by mouth as needed for cough. 03/06/22  Yes [provider]  furosemide (LASIX) 40 MG tablet TAKE 1 TABLET BY MOUTH TWICE A DAY Patient taking differently: Take 40 mg by mouth 2 (two) times daily. 09/03/21  Yes Lowne Chase, Yvonne R, DO  KLOR-CON M20 20 MEQ tablet Take 1 tablet (20 mEq total) by mouth 2 (two) times daily. Will take 3 a daily only as needed. Patient taking differently: Take 20 mEq by mouth 2 (two) times daily. 01/06/22  Yes [name], DO  linaclotide (LINZESS) 145 MCG CAPS capsule Take 1 capsule (145 mcg total) by mouth daily before breakfast. 03/17/22  Yes Sharyn Creamer, MD  Nintedanib (OFEV) 100 MG CAPS Take 100 mg by mouth in the morning and at bedtime. 07/03/20  Yes [provider]  ondansetron (ZOFRAN-ODT) 4 MG disintegrating tablet TAKE 1 TABLET BY MOUTH EVERY 8 HOURS AS NEEDED FOR NAUSEA AND VOMITING Patient taking differently: Take 4 mg by mouth as needed for nausea or vomiting. 07/18/21  Yes [name], DO  SYNTHROID 112 MCG tablet Take 1 tablet (112 mcg total) by mouth daily before breakfast. 03/02/22  Yes Roma Schanz R, DO  vitamin C (ASCORBIC ACID)  500 MG tablet Take 500 mg by mouth daily.   Yes [provider]  AMBULATORY NON FORMULARY MEDICATION Medication Name: Diltiazem 2% with Lidocaine 5%- use index finger, apply small amount of medication inside the rectum up to your first knuckle/joint for 8 weeks Patient not taking: Reported on 03/29/2022 03/20/22   Sharyn Creamer, MD     Vital Signs: BP (!) 169/81 (BP Location: Right Arm)   Pulse 70   Temp 98.2 F (36.8 C) (Oral)   Resp 16   Ht '5\' 4"'$  (1.626 m)   Wt 160 lb (72.6 kg)   SpO2 96%   BMI 27.46 kg/m   Physical Exam  NAD, alert Abdomen: soft, non-tender. Ostomy in place.  L TG drain in place.  Site intact. Thick, bloody output in bulb.  Flushes/aspirates easily.   Imaging: No results found.  Labs:  CBC: Recent Labs    04/14/22 0330 04/15/22 0414 04/16/22 0333 04/17/22 0342  WBC 11.2* 8.4 7.1 7.2  HGB 9.6* 9.6* 9.3* 9.7*  HCT 29.5* 29.9* 29.2* 30.6*  PLT 339 313 288 303     COAGS: Recent Labs    03/30/22 0129  INR 1.2     BMP: Recent Labs    04/14/22 0330 04/15/22 0414 04/16/22 0333 04/17/22 0342  NA 135 133* 138 135  K 3.8 3.9 4.0 4.3  CL 104 102 107 102  CO2 '25 25 26 26  '$ GLUCOSE 104* 118* 124* 100*  BUN '11 14 12 14  '$ CALCIUM 8.0* 7.9* 7.9* 8.0*  CREATININE 0.53 0.59 0.56 0.54  GFRNONAA >60 >60 >60 >60     LIVER FUNCTION TESTS: Recent Labs    04/10/22 0221 04/13/22 0229 04/16/22 0333 04/17/22 0342  BILITOT 0.3 0.5 0.4 0.3  AST 16 31 33 33  ALT '11 20 24 28  '$ ALKPHOS 53 52 56 60  PROT 5.7* 5.4* 6.2* 6.2*  ALBUMIN 1.9* 1.7* 2.0* 2.1*     Assessment and Plan: 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 20 7.5 17.'5 25 5    '$ Interval imaging/drain manipulation:  None  Current examination: Flushes/aspirates easily.  Insertion site unremarkable. Suture and stat lock in place. Dressed appropriately.   Plan: Continue TID flushes with 5 cc NS. Record output Q shift. Dressing changes QD or PRN if soiled.  Call IR APP or on call IR MD if difficulty flushing or sudden change in drain output.    Discharge planning: Per surgery note, patient is nearing discharge.  Have placed outpatient orders for IR drain clinic follow-up.  She should continue to flush drain daily with 5-10 mL sterile saline.  Maintain record of output daily and bring a log with you to daily appointments.  Schedulers will contact her with date and time of follow-up appointment after discharge.    Electronically Signed: Docia Barrier, PA 04/17/2022, 3:57 PM   I spent a total of 15 Minutes at the the patient's bedside AND on the patient's hospital floor or unit, greater than 50% of which was counseling/coordinating care for pelvic fluid collection

## 2022-04-17 NOTE — Assessment & Plan Note (Signed)
Prolonged postoperative ileus and inability to take p.o. intake prompting initiation of TPN TPN has since been weaned off on 1/13. Patient has been placed back on a soft diet. Encouraging patient to continue to increase oral intake and encouraging family to bring appropriate foods for patient to eat that she enjoys. Monitoring electrolytes closely and replacing as necessary

## 2022-04-17 NOTE — Progress Notes (Signed)
Progress Note  17 Days Post-Op  Subjective: Patient still taking in mostly liquid but had a bite of toast yesterday. Does have some appetite. Ambulating more. Changed ostomy bag with verbal cues only today. Still reports pain from TG drain but PO tramadol working well for pain control. She does not feel ready to go home yet but feels like maybe early next week. Motivation seems much improved in the last few days. Her daughter got in from [place] overnight and is planning to stay and help her and her husband when patient initially gets home.   Objective: Vital signs in last 24 hours: Temp:  [98.1 F (36.7 C)-98.4 F (36.9 C)] 98.3 F (36.8 C) (01/12 0441) Pulse Rate:  [73-76] 76 (01/12 0441) Resp:  [16-18] 18 (01/12 0441) BP: (146-159)/(65-73) 159/73 (01/12 0441) SpO2:  [94 %-97 %] 95 % (01/12 0441) Weight:  [72.6 kg] 72.6 kg (01/12 0455) Last BM Date : 04/16/22  Intake/Output from previous day: 01/11 0701 - 01/12 0700 In: 2184.5 [P.O.:660; I.V.:1014.5; IV Piggyback:500] Out: 112.5 [Drains:42.5; Stool:70] Intake/Output this shift: Total I/O In: 120 [P.O.:120] Out: 5 [Drains:5]  PE: General: WD, chronically ill appearing female who is laying in bed in NAD Heart: regular, rate, and rhythm.  Lungs:  Respiratory effort nonlabored Abd: soft, appropriately ttp, ND, +BS, stoma viable with gas and small amount stool in bag, incisions C/D/I, drain with bloody fluid    Lab Results:  Recent Labs    04/16/22 0333 04/17/22 0342  WBC 7.1 7.2  HGB 9.3* 9.7*  HCT 29.2* 30.6*  PLT 288 303    BMET Recent Labs    04/16/22 0333 04/17/22 0342  NA 138 135  K 4.0 4.3  CL 107 102  CO2 26 26  GLUCOSE 124* 100*  BUN 12 14  CREATININE 0.56 0.54  CALCIUM 7.9* 8.0*    PT/INR No results for input(s): "LABPROT", "INR" in the last 72 hours. CMP     Component Value Date/Time   NA 135 04/17/2022 0342   NA 140 03/21/2021 1214   K 4.3 04/17/2022 0342   CL 102 04/17/2022 0342    CO2 26 04/17/2022 0342   GLUCOSE 100 (H) 04/17/2022 0342   BUN 14 04/17/2022 0342   BUN 21 03/21/2021 1214   CREATININE 0.54 04/17/2022 0342   CREATININE 0.74 03/13/2022 1511   CALCIUM 8.0 (L) 04/17/2022 0342   PROT 6.2 (L) 04/17/2022 0342   ALBUMIN 2.1 (L) 04/17/2022 0342   AST 33 04/17/2022 0342   ALT 28 04/17/2022 0342   ALKPHOS 60 04/17/2022 0342   BILITOT 0.3 04/17/2022 0342   GFRNONAA >60 04/17/2022 0342   GFRAA 85 05/24/2020 1006   Lipase     Component Value Date/Time   LIPASE 25 03/28/2022 1904       Studies/Results: No results found.  Anti-infectives: Anti-infectives (From admission, onward)    Start     Dose/Rate Route Frequency Ordered Stop   04/15/22 1200  ampicillin (OMNIPEN) 1 g in sodium chloride 0.9 % 100 mL IVPB  Status:  Discontinued        1 g 300 mL/hr over 20 Minutes Intravenous Every 6 hours 04/15/22 1044 04/15/22 1053   04/15/22 1200  Ampicillin-Sulbactam (UNASYN) 3 g in sodium chloride 0.9 % 100 mL IVPB        3 g 200 mL/hr over 30 Minutes Intravenous Every 6 hours 04/15/22 1053 04/18/22 1157   04/11/22 1400  piperacillin-tazobactam (ZOSYN) IVPB 3.375 g  Status:  Discontinued  3.375 g 12.5 mL/hr over 240 Minutes Intravenous Every 8 hours 04/11/22 1143 04/15/22 1044   04/05/22 1600  piperacillin-tazobactam (ZOSYN) IVPB 3.375 g        3.375 g 12.5 mL/hr over 240 Minutes Intravenous Every 8 hours 04/05/22 1222 04/10/22 1230   03/31/22 1045  cefoTEtan (CEFOTAN) 2 g in sodium chloride 0.9 % 100 mL IVPB        2 g 200 mL/hr over 30 Minutes Intravenous On call to O.R. 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 - WOC following for new ostomy - overall doing well with it   FEN: wean TPN, soft diet  ID: zosyn 12/31>1/5; Zosyn 1/6>1/10; unasyn 1/10>>1/14 VTE: LMWH   - below per TRH -  Chronic diastolic HF Hypothyroidism Pulmonary fibrosis HTN    LOS: 19 days    Norm Parcel, [facility] Surgery 04/17/2022, 11:02 AM Please see Amion for pager number during day hours 7:00am-4:30pm

## 2022-04-18 DIAGNOSIS — K651 Peritoneal abscess: Secondary | ICD-10-CM

## 2022-04-18 DIAGNOSIS — E038 Other specified hypothyroidism: Secondary | ICD-10-CM

## 2022-04-18 LAB — CBC WITH DIFFERENTIAL/PLATELET
Abs Immature Granulocytes: 0.11 10*3/uL — ABNORMAL HIGH (ref 0.00–0.07)
Basophils Absolute: 0.2 10*3/uL — ABNORMAL HIGH (ref 0.0–0.1)
Basophils Relative: 2 %
Eosinophils Absolute: 0.8 10*3/uL — ABNORMAL HIGH (ref 0.0–0.5)
Eosinophils Relative: 11 %
HCT: 31.8 % — ABNORMAL LOW (ref 36.0–46.0)
Hemoglobin: 9.8 g/dL — ABNORMAL LOW (ref 12.0–15.0)
Immature Granulocytes: 2 %
Lymphocytes Relative: 20 %
Lymphs Abs: 1.4 10*3/uL (ref 0.7–4.0)
MCH: 31.4 pg (ref 26.0–34.0)
MCHC: 30.8 g/dL (ref 30.0–36.0)
MCV: 101.9 fL — ABNORMAL HIGH (ref 80.0–100.0)
Monocytes Absolute: 1.7 10*3/uL — ABNORMAL HIGH (ref 0.1–1.0)
Monocytes Relative: 24 %
Neutro Abs: 3 10*3/uL (ref 1.7–7.7)
Neutrophils Relative %: 41 %
Platelets: 313 10*3/uL (ref 150–400)
RBC: 3.12 MIL/uL — ABNORMAL LOW (ref 3.87–5.11)
RDW: 16.1 % — ABNORMAL HIGH (ref 11.5–15.5)
WBC: 7.3 10*3/uL (ref 4.0–10.5)
nRBC: 0 % (ref 0.0–0.2)

## 2022-04-18 LAB — IRON AND TIBC
Iron: 27 ug/dL — ABNORMAL LOW (ref 28–170)
Saturation Ratios: 13 % (ref 10.4–31.8)
TIBC: 209 ug/dL — ABNORMAL LOW (ref 250–450)
UIBC: 182 ug/dL

## 2022-04-18 LAB — COMPREHENSIVE METABOLIC PANEL
ALT: 30 U/L (ref 0–44)
AST: 33 U/L (ref 15–41)
Albumin: 2.1 g/dL — ABNORMAL LOW (ref 3.5–5.0)
Alkaline Phosphatase: 62 U/L (ref 38–126)
Anion gap: 8 (ref 5–15)
BUN: 13 mg/dL (ref 8–23)
CO2: 26 mmol/L (ref 22–32)
Calcium: 8.1 mg/dL — ABNORMAL LOW (ref 8.9–10.3)
Chloride: 101 mmol/L (ref 98–111)
Creatinine, Ser: 0.65 mg/dL (ref 0.44–1.00)
GFR, Estimated: >60 mL/min (ref >60)
Glucose, Bld: 84 mg/dL (ref 70–99)
Potassium: 4 mmol/L (ref 3.5–5.1)
Sodium: 135 mmol/L (ref 135–145)
Total Bilirubin: 0.4 mg/dL (ref 0.3–1.2)
Total Protein: 6.3 g/dL — ABNORMAL LOW (ref 6.5–8.1)

## 2022-04-18 LAB — MAGNESIUM: Magnesium: 2.3 mg/dL (ref 1.7–2.4)

## 2022-04-18 LAB — VITAMIN B12: Vitamin B-12: 640 pg/mL (ref 180–914)

## 2022-04-18 LAB — C-REACTIVE PROTEIN: CRP: 5.4 mg/dL — ABNORMAL HIGH (ref <1.0)

## 2022-04-18 LAB — FOLATE: Folate: 10.8 ng/mL (ref >5.9)

## 2022-04-18 MED ORDER — TRAMADOL HCL 50 MG PO TABS
50.0000 mg | ORAL_TABLET | Freq: Four times a day (QID) | ORAL | Status: DC | PRN
  Administered 2022-04-18 – 2022-04-19 (×3): 50 mg via ORAL
  Administered 2022-04-20: 100 mg via ORAL
  Administered 2022-04-20 (×2): 50 mg via ORAL
  Administered 2022-04-21 (×2): 100 mg via ORAL
  Filled 2022-04-18: qty 1
  Filled 2022-04-18: qty 2
  Filled 2022-04-18: qty 1
  Filled 2022-04-18: qty 2
  Filled 2022-04-18 (×3): qty 1
  Filled 2022-04-18: qty 2

## 2022-04-18 MED ORDER — SODIUM CHLORIDE 0.9 % IV SOLN
3.0000 g | Freq: Four times a day (QID) | INTRAVENOUS | Status: AC
  Administered 2022-04-18 – 2022-04-21 (×12): 3 g via INTRAVENOUS
  Filled 2022-04-18 (×12): qty 8

## 2022-04-18 MED ORDER — LISINOPRIL 10 MG PO TABS
10.0000 mg | ORAL_TABLET | Freq: Every day | ORAL | Status: DC
  Administered 2022-04-18 – 2022-04-20 (×3): 10 mg via ORAL
  Filled 2022-04-18 (×3): qty 1

## 2022-04-18 MED ORDER — POLYVINYL ALCOHOL 1.4 % OP SOLN
2.0000 [drp] | OPHTHALMIC | Status: DC | PRN
  Filled 2022-04-18: qty 15

## 2022-04-18 NOTE — Progress Notes (Signed)
Alexandria [name] [id number] 20-Mar-1946  CARE TEAM:  PCP: [name], DO  Outpatient Care Team: Patient Care Team: Carollee Herter, Alferd Apa, DO as PCP - General (Family Medicine) Berniece Salines, DO as PCP - Cardiology (Cardiology) Maisie Fus, MD (Inactive) as Consulting Physician (Obstetrics and Gynecology) Berniece Salines, DO as Consulting Physician (Cardiology) Sharyn Creamer, MD as Consulting Physician (Gastroenterology)  Inpatient Treatment Team: Treatment Team: Attending Provider: Vernelle Emerald, MD; Rounding Team: Edison Pace, Md, MD; [place] Nurse: McNichol, Rudi Heap, RN; Rounding Team: Dorthy Cooler Radiology, MD; Rounding Team: Fatima Blank, MD; Charge Nurse: Dorinda Hill, RN; Case Manager: Addison Naegeli, RN; Utilization Review: Alanson Puls, RN   Problem List:   Active Problems:   Hypothyroidism   Idiopathic pulmonary fibrosis [facility])   Essential hypertension   Diverticulitis of large intestine with perforation and abscess   Rectal bleeding   Pelvic abscess in female   Acute blood loss anemia   Chronic diastolic CHF (congestive heart failure) (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  Quite kinked and folded upon itself.  At the epicenter of the folded rectosigmoid mesentery was an obvious abscess with contained perforation and some stool.  Rectum itself did not seem particularly inflamed.  No definite endoluminal mass up to the untwisted tumor but  thickened and abnormal.   No obvious metastatic disease on visceral parietal peritoneum or liver.   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  Rectal stump to 11 cm with suture.   CASE DATA:   Type of patient?: LDOW CASE (Surgical Hospitalist [facility] Inpatient)   Status of Case? URGENT Add On   Infection Present At Time Of Surgery (PATOS)?  ABSCESS     Assessment  Gradually recovering  [facility] Stay = 20 days)  Plan: Pathology consistent with diverticulitis. - OR findings of rectosigmoid stricture with perforation & abscess, probable diverticulitis  - pathology is diverticulitis no malignancy - CT AP1/1 with ileus, pelvic drain in place with decreased but residual pelvix abscess - CT 1/6 with pelvic abscess vs hematoma s/p IR drain placement 1/7, cxs with enterococcus, continue through Sunday  -Suspect rectal irritation and bleeding related to irritation around the rectal stump with a recurrent abscess.  Should continue to taper off. - Would recommend outpatient drain study to rule out fistula stump leak in about 2 weeks. -Solid heart diet.  MiraLAX bowel regimen.  Already on half TPN.  Wean off.   - hgb 9.7 s/p 2 units prbc 1/8, stable.  She would benefit from IV iron but she declines taking it. - mobilize!!!.  Actually motivated to try and go to the bathroom by herself.  Discussed with nursing. - WOC following for new ostomy - overall doing well with it   FEN: wean TPN to off.  Heart diet.  ID: zosyn 12/31>1/5; Zosyn 1/6>1/10; unasyn 1/10>>1/17 VTE: LMWH   - below per TRH -  Chronic diastolic HF Hypothyroidism Pulmonary fibrosis HTN     Disposition:  Disposition:  The patient is from: Home  Anticipate discharge to:  Home with Home Health  Anticipated Date of Discharge is:  January 15,2024    Barriers to discharge:  Pending Clinical improvement (more likely than not)  Patient currently is NOT MEDICALLY STABLE for  discharge from the hospital from a surgery standpoint.      I reviewed nursing notes, hospitalist notes, last 24 h vitals and pain scores, last 48 h intake and output, last 24 h labs and trends, and last 24 h imaging results. I have reviewed this patient's available data, including medical history, events of note, test results, etc as part of my evaluation.  A significant portion of that time was spent in counseling.  Care during the described time interval was provided by me.  This care required moderate level of medical decision making.  04/18/2022    Subjective: (Chief complaint)  Patient with less pain.  Tolerating soft diet.  Did walk with a walking assistant but complains she is not getting the help to get up.  Once bathroom privileges.  Still with some rectal drainage but does admit it is tapered off.  Colostomy working better.  Wanting to wait another week before going home.  Objective:  Vital signs:  Vitals:   04/17/22 0455 04/17/22 1354 04/17/22 2100 04/18/22 0510  BP:  (!) 169/81 (!) 143/75 (!) 170/80  Pulse:  70 74 73  Resp:  16 17   Temp:  98.2 F (36.8 C) 98 F (36.7 C) 98.6 F (37 C)  TempSrc:  Oral Oral Oral  SpO2:  96% 95% 96%  Weight: 72.6 kg     Height:        Last BM Date : 04/17/22  Intake/Output   Yesterday:  01/12 0701 - 01/13 0700 In: 2099 [P.O.:550; I.V.:1244; IV Piggyback:300] Out: 100 [Drains:22.5; Stool:77.5] This shift:  No intake/output data recorded.  Bowel function:  Flatus: YES  BM:  YES  Drain: Serosanguinous   Physical Exam:  General: Pt awake/alert in no acute distress Eyes: PERRL, normal EOM.  Sclera clear.  No icterus Neuro: CN II-XII intact w/o focal sensory/motor deficits. Lymph: No head/neck/groin lymphadenopathy Psych:  No delerium/psychosis/paranoia.  Oriented x 4 HENT: Normocephalic, Mucus membranes moist.  No thrush Neck: Supple, No tracheal deviation.  No obvious thyromegaly Chest: No pain to chest  wall compression.  Good respiratory excursion.  No audible wheezing CV:  Pulses intact.  Regular rhythm.  No major extremity edema MS: Normal AROM mjr joints.  No obvious deformity  Abdomen: Soft.  Nondistended.  Tenderness at incision mild.  Incision well-closed with no evidence of any cellulitis nor abscess. .  No evidence of peritonitis.  No incarcerated hernias.  Ext:   No deformity.  No mjr edema.  No cyanosis Skin: No petechiae / purpurea.  No major sores.  Warm and dry    Results:   Cultures: Recent Results (from the past 720 hour(s))  Culture, blood (Routine X 2) w Reflex to ID Panel     Status: None   Collection Time: 04/11/22 12:53 PM   Specimen: Right Antecubital; Blood  Result Value Ref Range Status   Specimen Description   Final    RIGHT ANTECUBITAL BOTTLES DRAWN AEROBIC ONLY Performed at [facility] [address] [place], [place] [address]    Special Requests   Final    Blood Culture adequate volume Performed at [facility] [address] [place], [place] [address]    Culture  Final    NO GROWTH 5 DAYS Performed at [facility], [place] [address] [place], [place] [address]    Report Status 04/16/2022 FINAL  Final  Culture, blood (Routine X 2) w Reflex to ID Panel     Status: None   Collection Time: 04/11/22 12:53 PM   Specimen: BLOOD RIGHT HAND  Result Value Ref Range Status   Specimen Description   Final    BLOOD RIGHT HAND BOTTLES DRAWN AEROBIC ONLY Performed at [facility], [place] [address], [place], [place] [address]    Special Requests   Final    Blood Culture adequate volume Performed at [facility] [address] [place], [place] [address]    Culture   Final    NO GROWTH 5 DAYS Performed at [facility], [place] [address], [place], [place] [address]    Report Status 04/16/2022 FINAL  Final  Aerobic/Anaerobic Culture w Gram Stain (surgical/deep wound)     Status: None    Collection Time: 04/12/22  1:19 PM   Specimen: Abscess  Result Value Ref Range Status   Specimen Description   Final    ABSCESS Performed at [facility] [address], [place], [place] [address]    Special Requests   Final    NONE Performed at [facility], [place] [address] [place], [place] [address]    Gram Stain   Final    ABUNDANT WBC PRESENT, PREDOMINANTLY PMN ABUNDANT GRAM POSITIVE COCCI IN PAIRS IN CLUSTERS FEW GRAM NEGATIVE RODS    Culture   Final    ABUNDANT ENTEROCOCCUS FAECALIS ABUNDANT BACTEROIDES FRAGILIS ABUNDANT BACTEROIDES OVATUS BETA LACTAMASE POSITIVE Performed at [facility], [place] [address], [place],  [address]    Report Status 04/15/2022 FINAL  Final   Organism ID, Bacteria ENTEROCOCCUS FAECALIS  Final      Susceptibility   Enterococcus faecalis - MIC*    AMPICILLIN <=2 SENSITIVE Sensitive     VANCOMYCIN 2 SENSITIVE Sensitive     GENTAMICIN SYNERGY SENSITIVE Sensitive     * ABUNDANT ENTEROCOCCUS FAECALIS    Labs: Results for orders placed or performed during the hospital encounter of 03/28/22 (from the past 48 hour(s))  CBC with Differential/Platelet     Status: Abnormal   Collection Time: 04/17/22  3:42 AM  Result Value Ref Range   WBC 7.2 4.0 - 10.5 K/uL   RBC 2.98 (L) 3.87 - 5.11 MIL/uL   Hemoglobin 9.7 (L) 12.0 - 15.0 g/dL   HCT 30.6 (L) 36.0 - 46.0 %   MCV 102.7 (H) 80.0 - 100.0 fL   MCH 32.6 26.0 - 34.0 pg   MCHC 31.7 30.0 - 36.0 g/dL   RDW 16.5 (H) 11.5 - 15.5 %   Platelets 303 150 - 400 K/uL   nRBC 0.0 0.0 - 0.2 %   Neutrophils Relative % 42 %   Neutro Abs 3.0 1.7 - 7.7 K/uL   Lymphocytes Relative 19 %   Lymphs Abs 1.4 0.7 - 4.0 K/uL   Monocytes Relative 24 %   Monocytes Absolute 1.7 (H) 0.1 - 1.0 K/uL   Eosinophils Relative 11 %   Eosinophils Absolute 0.8 (H) 0.0 - 0.5 K/uL   Basophils Relative 2 %   Basophils Absolute 0.1 0.0 - 0.1 K/uL   Immature Granulocytes 2 %   Abs  Immature Granulocytes 0.16 (H) 0.00 - 0.07 K/uL    Comment: Performed at [facility], [place] [address], [place], [place] [address]  Magnesium     Status: None   Collection Time: 04/17/22  3:42 AM  Result Value Ref Range   Magnesium 2.1 1.7 - 2.4 mg/dL    Comment: Performed at [facility], [place] [address], [place], [place] [address]  Comprehensive metabolic panel     Status: Abnormal   Collection Time: 04/17/22  3:42 AM  Result Value Ref Range   Sodium 135 135 - 145 mmol/L   Potassium 4.3 3.5 - 5.1 mmol/L   Chloride 102 98 - 111 mmol/L   CO2 26 22 - 32 mmol/L   Glucose, Bld 100 (H) 70 - 99 mg/dL    Comment: Glucose reference range applies only to samples taken after fasting for at least 8 hours.   BUN 14 8 - 23 mg/dL   Creatinine, Ser 0.54 0.44 - 1.00 mg/dL   Calcium 8.0 (L) 8.9 - 10.3 mg/dL   Total Protein 6.2 (L) 6.5 - 8.1 g/dL   Albumin 2.1 (L) 3.5 - 5.0 g/dL   AST 33 15 - 41 U/L   ALT 28 0 - 44 U/L   Alkaline Phosphatase 60 38 - 126 U/L   Total Bilirubin 0.3 0.3 - 1.2 mg/dL   GFR, Estimated >60 >60 mL/min    Comment: (NOTE) Calculated using the CKD-EPI Creatinine Equation (2021)    Anion gap 7 5 - 15    Comment: Performed at [facility] [place], [place] [address] [place], [place] [address]  CBC with Differential/Platelet     Status: Abnormal   Collection Time: 04/18/22  3:55 AM  Result Value Ref Range   WBC 7.3 4.0 - 10.5 K/uL   RBC 3.12 (L) 3.87 - 5.11 MIL/uL   Hemoglobin 9.8 (L) 12.0 - 15.0 g/dL   HCT 31.8 (L) 36.0 - 46.0 %   MCV 101.9 (H) 80.0 - 100.0 fL   MCH 31.4 26.0 - 34.0 pg   MCHC 30.8 30.0 - 36.0 g/dL   RDW 16.1 (H) 11.5 - 15.5 %   Platelets 313 150 - 400 K/uL   nRBC 0.0 0.0 - 0.2 %   Neutrophils Relative % 41 %   Neutro Abs 3.0 1.7 - 7.7 K/uL   Lymphocytes Relative 20 %   Lymphs Abs 1.4 0.7 - 4.0 K/uL   Monocytes Relative 24 %   Monocytes Absolute 1.7 (H) 0.1 - 1.0 K/uL   Eosinophils Relative 11 %    Eosinophils Absolute 0.8 (H) 0.0 - 0.5 K/uL   Basophils Relative 2 %   Basophils Absolute 0.2 (H) 0.0 - 0.1 K/uL   Immature Granulocytes 2 %   Abs Immature Granulocytes 0.11 (H) 0.00 - 0.07 K/uL    Comment: Performed at [facility], [place] [address], [place], [place] [address]  Comprehensive metabolic panel     Status: Abnormal   Collection Time: 04/18/22  3:55 AM  Result Value Ref Range   Sodium 135 135 - 145 mmol/L   Potassium 4.0 3.5 - 5.1 mmol/L   Chloride 101 98 - 111 mmol/L   CO2 26 22 - 32 mmol/L   Glucose, Bld 84 70 - 99 mg/dL    Comment: Glucose reference range applies only to samples taken after fasting for at least 8 hours.   BUN 13 8 - 23 mg/dL   Creatinine, Ser 0.65 0.44 - 1.00 mg/dL   Calcium 8.1 (L) 8.9 - 10.3 mg/dL   Total Protein 6.3 (L) 6.5 -  8.1 g/dL   Albumin 2.1 (L) 3.5 - 5.0 g/dL   AST 33 15 - 41 U/L   ALT 30 0 - 44 U/L   Alkaline Phosphatase 62 38 - 126 U/L   Total Bilirubin 0.4 0.3 - 1.2 mg/dL   GFR, Estimated >60 >60 mL/min    Comment: (NOTE) Calculated using the CKD-EPI Creatinine Equation (2021)    Anion gap 8 5 - 15    Comment: Performed at [facility], [place] [address], [place], [place] [address]  Magnesium     Status: None   Collection Time: 04/18/22  3:55 AM  Result Value Ref Range   Magnesium 2.3 1.7 - 2.4 mg/dL    Comment: Performed at [facility], [place] [address], [place], [place] [address]    Imaging / Studies: No results found.  Medications / Allergies: per chart  Antibiotics: Anti-infectives (From admission, onward)    Start     Dose/Rate Route Frequency Ordered Stop   04/18/22 1000  Ampicillin-Sulbactam (UNASYN) 3 g in sodium chloride 0.9 % 100 mL IVPB        3 g 200 mL/hr over 30 Minutes Intravenous Every 6 hours 04/18/22 0901 04/21/22 0959   04/15/22 1200  ampicillin (OMNIPEN) 1 g in sodium chloride 0.9 % 100 mL IVPB  Status:  Discontinued        1 g 300 mL/hr over 20  Minutes Intravenous Every 6 hours 04/15/22 1044 04/15/22 1053   04/15/22 1200  Ampicillin-Sulbactam (UNASYN) 3 g in sodium chloride 0.9 % 100 mL IVPB        3 g 200 mL/hr over 30 Minutes Intravenous Every 6 hours 04/15/22 1053 04/18/22 0535   04/11/22 1400  piperacillin-tazobactam (ZOSYN) IVPB 3.375 g  Status:  Discontinued        3.375 g 12.5 mL/hr over 240 Minutes Intravenous Every 8 hours 04/11/22 1143 04/15/22 1044   04/05/22 1600  piperacillin-tazobactam (ZOSYN) IVPB 3.375 g        3.375 g 12.5 mL/hr over 240 Minutes Intravenous Every 8 hours 04/05/22 1222 04/10/22 1230   03/31/22 1045  cefoTEtan (CEFOTAN) 2 g in sodium chloride 0.9 % 100 mL IVPB        2 g 200 mL/hr over 30 Minutes Intravenous On call to O.R. 03/31/22 0959 03/31/22 1225   03/29/22 0800  piperacillin-tazobactam (ZOSYN) IVPB 3.375 g  Status:  Discontinued        3.375 g 12.5 mL/hr over 240 Minutes Intravenous Every 8 hours 03/29/22 0746 04/05/22 0826         Note: Portions of this report may have been transcribed using voice recognition software. Every effort was made to ensure accuracy; however, inadvertent computerized transcription errors may be present.   Any transcriptional errors that result from this process are unintentional.    Adin Hector, MD, FACS, MASCRS Esophageal, Gastrointestinal & Colorectal Surgery Robotic and Minimally Invasive Surgery  [facility]. [address], [place], [place] [address] [phone] Fax [phone] Main  CONTACT INFORMATION:  Weekday (9AM-5PM): Call [facility] main office at [phone]  Weeknight (5PM-9AM) or Weekend/Holiday: Check [email or web address] (password " [email or web address]") for General Surgery [facility] coverage  (Please, do not use SecureChat as it is not reliable communication to reach operating surgeons for immediate patient care given surgeries/outpatient duties/clinic/cross-coverage/off post-call which would  lead to a delay in care.  Epic staff messaging available for outptient concerns, but may not be answered  for 48 hours or more).     04/18/2022  9:37 AM

## 2022-04-18 NOTE — Progress Notes (Signed)
PROGRESS NOTE   Alexandria [name]  [name]:[id number] DOB: June 12, 1945 DOA: 03/28/2022 PCP: [name], DO   Date of Service: the patient was seen and examined on 04/18/2022  Brief Narrative:  77 year old female with past medical history of hypothyroidism, chronic diastolic ingestive heart failure (echo 06/2020 EF 60-65% with G1DD), idiopathic pulmonary fibrosis (follows with Dr. [name] at [facility]), hypertension, diverticulosis and recent identification of a rectosigmoid mass presenting with abdominal pain and constipation.    Of note,  CT 03/20/2022 status post flexible sigmoidoscopy performed that same day by Dr. [name] with initial biopsy revealing no dysplasia or malignancy.  Patient had a follow-up MRI 12/21 revealing severe sigmoid diverticulosis with concentric wall thickening involving the distal sigmoid colon and upper rectum and adjacent 3.6 cm multiloculated fluid collection favoring diverticulitis and diverticular abscess (but not read until 12/26).  Upon eventual presentation to [facility] emergency department on 12/23 patient reported not having a bowel movement for 7 days.  At that time, MRI results were not yet available and patient was hospitalized for severe constipation.  Hospitalist group was called and patient was admitted to the hospital.  Dr. [name] and Dr. [name] with general surgery was consulted.  CT imaging of the abdomen pelvis performed on 12/24 in addition to 12/21 MRI seems consistent with peridiverticular abscess formation.  Patient was placed on intravenous antibiotic therapy at that time.  Interventional radiology was consulted but unfortunately drainage could not be performed.  Patient eventually underwent rectosigmoid resection with colostomy performed by Dr. [name] on 03/31/2022 with continued antibiotics afterwards.  On 04/11/2022 patient began to develop recurrence of fever and worsening abdominal pain with rectal bleeding.  Repeat CT imaging  consistent with new abscess formation measuring 8.9 x 8.1 x 6.7 cm with concern for leakage from the stump.  On 04/12/2022 patient underwent IR guided drainage of the intra-abdominal abscess.  Cultures from 1/7 abscess drainage growing out Enterococcus faecalis.  Hospitalization is also been complicated by postoperative ileus with requiring temporary use of TPN.  This has since been discontinued on 1/13.  Patient has since been advanced to a soft diet as TPN is being weaned.  Patient also received a total of 4 units of transfused packed red blood cells throughout the hospitalization for recurrent anemia with last transfusion being on 1/8.       Assessment and Plan: Diverticulitis of large intestine with perforation and abscess CT imaging of abdomen and pelvis performed 12/24 as well as 12/21 MRI consistent with peridiverticular abscess formation Patient status post rectosigmoid resection with colostomy performed by Dr. [name] on 12/26 Patient additionally developed new abscess formation in the pelvis identified on repeat CT imaging 1/6 status post IR guided drainage placement 1/7. General surgery following daily, their input is appreciated Interventional radiology additionally following daily for management of patient's pelvic abscess drain, their input is also appreciated. I have additionally consulted Dr. [name] with infectious disease to review and provide guidance on the selection and course of antibiotics for patient's Enterococcus faecalis infection.  Their input is also appreciated. Cultures from 1/7 growing Enterococcus faecalis Continue to treat with intravenous Unasyn for now. Patient will likely require repeat CT imaging of the abdomen and pelvis to reassess abscess prior to discharge we will tentatively plan for Monday in anticipation for possible discharge 24 to 48 hours after that.   Pelvic abscess in female Please see assessment and plan above  Ileus following gastrointestinal surgery  ([place]) Prolonged postoperative ileus and inability to take  p.o. intake prompting initiation of TPN Pharmacy has been managing TPN thus far, their input is appreciated. Weaning TPN off per guidance from general surgery Patient has been placed back on a soft diet.  Encouraging patient to continue to increase oral intake and encouraging family to bring appropriate foods for patient to eat that she enjoys. Monitoring electrolytes closely and replacing as necessary  Toxic metabolic encephalopathy Prolonged course of multifactorial delirium thought to be secondary to prolonged hospitalization, infection and polypharmacy This has since resolved  Acute blood loss anemia Patient received a total of 4 units of packed red blood cells transfused during this hospitalization with 2 units transfused on 1/8 No clinical evidence of bleeding currently with exception of serosanguineous drainage in the patient's percutaneous drain No active rectal bleeding Hemoglobin and hematocrit remain stable. Monitoring hemoglobin and hematocrit with serial CBCs  Chronic diastolic CHF (congestive heart failure) (HCC) No clinical evidence of cardiogenic volume overload Strict input and output monitoring Daily weights    Idiopathic pulmonary fibrosis (HCC) Longstanding known history, follows with Dr. [name] at [facility] pulmonology No active symptoms Outpatient follow up  Essential hypertension Initiating low-dose lisinopril at this time and will uptitrate slowly considering persisting hypertension Somewhat relaxed blood pressure targets due to patient's advanced age. As needed intravenous antihypertensives for markedly elevated blood pressure.  Rectal bleeding Intermittently occurring earlier in the hospitalization which has since stopped Hemoglobin and hematocrit stable  Hypothyroidism Resume home regimen of Synthroid    Subjective:  Patient states that her abdominal pain is improved compared to  yesterday.  Patient's abdominal pain is generalized, mild to moderate intensity, sharp in quality, worse with movement.  Family reports the patient's appetite is improved today that she ate portions of a grilled cheese sandwich that they brought from home.   Physical Exam:  Vitals:   04/17/22 1354 04/17/22 2100 04/18/22 0510 04/18/22 1329  BP: (!) 169/81 (!) 143/75 (!) 170/80 (!) 176/87  Pulse: 70 74 73 75  Resp: '16 17  16  '$ Temp: 98.2 F (36.8 C) 98 F (36.7 C) 98.6 F (37 C) 97.8 F (36.6 C)  TempSrc: Oral Oral Oral   SpO2: 96% 95% 96% 97%  Weight:      Height:        Constitutional: Awake alert and oriented x3, no associated distress.   Skin: no rashes, no lesions, good skin turgor noted. Eyes: Pupils are equally reactive to light.  No evidence of scleral icterus or conjunctival pallor.  ENMT: Moist mucous membranes noted.  Posterior pharynx clear of any exudate or lesions.   Respiratory: Notable bibasilar rales, worse in the right field, unchanged from yesterday.  No evidence of wheezing.  Normal respiratory effort. No accessory muscle use.  Cardiovascular: Regular rate and rhythm, no murmurs / rubs / gallops. No extremity edema. 2+ pedal pulses. No carotid bruits.  Abdomen: Abdomen is diffusely tender but less so compared to yesterday.  Abdomen is soft..  Ostomy noted to be in place with pink mucosa and dark brown stool.  Notable percutaneous JP drain extending from the pelvis with serosanguineous drainage.  Hypoactive sounds noted. Musculoskeletal: No joint deformity upper and lower extremities. Good ROM, no contractures.  Normal motor tone.   Data Reviewed:  I have personally reviewed and interpreted labs, imaging.  Significant findings are   CBC: Recent Labs  Lab 04/14/22 0330 04/15/22 0414 04/16/22 0333 04/17/22 0342 04/18/22 0355  WBC 11.2* 8.4 7.1 7.2 7.3  NEUTROABS 6.9 4.5 3.3 3.0 3.0  HGB 9.6* 9.6* 9.3* 9.7* 9.8*  HCT 29.5* 29.9* 29.2* 30.6* 31.8*  MCV 97.4  99.0 100.3* 102.7* 101.9*  PLT 339 313 288 303 433   Basic Metabolic Panel: Recent Labs  Lab 04/12/22 0302 04/13/22 0229 04/14/22 0330 04/15/22 0414 04/16/22 0333 04/17/22 0342 04/18/22 0355  NA 138 136 135 133* 138 135 135  K 3.8 3.9 3.8 3.9 4.0 4.3 4.0  CL 107 105 104 102 107 102 101  CO2 '26 24 25 25 26 26 26  '$ GLUCOSE 110* 109* 104* 118* 124* 100* 84  BUN '12 13 11 14 12 14 13  '$ CREATININE 0.54 0.55 0.53 0.59 0.56 0.54 0.65  CALCIUM 7.7* 7.8* 8.0* 7.9* 7.9* 8.0* 8.1*  MG  --  1.9 2.2  --  2.5* 2.1 2.3  PHOS 3.0 3.2  --   --  2.7  --   --    GFR: Estimated Creatinine Clearance: 57.5 mL/min (by C-G formula based on SCr of 0.65 mg/dL). Liver Function Tests: Recent Labs  Lab 04/13/22 0229 04/16/22 0333 04/17/22 0342 04/18/22 0355  AST 31 33 33 33  ALT '20 24 28 30  '$ ALKPHOS 52 56 60 62  BILITOT 0.5 0.4 0.3 0.4  PROT 5.4* 6.2* 6.2* 6.3*  ALBUMIN 1.7* 2.0* 2.1* 2.1*     Code Status:  Full code.  Code status decision has been confirmed with: patient Family Communication: Plan of care discussed with daughter and husband who are both at the bedside.   Severity of Illness:  The appropriate patient status for this patient is INPATIENT. Inpatient status is judged to be reasonable and necessary in order to provide the required intensity of service to ensure the patient's safety. The patient's presenting symptoms, physical exam findings, and initial radiographic and laboratory data in the context of their chronic comorbidities is felt to place them at high risk for further clinical deterioration. Furthermore, it is not anticipated that the patient will be medically stable for discharge from the hospital within 2 midnights of admission.   * I certify that at the point of admission it is my clinical judgment that the patient will require inpatient hospital care spanning beyond 2 midnights from the point of admission due to high intensity of service, high risk for further deterioration  and high frequency of surveillance required.*  Time spent:  55 minutes  Author:  Vernelle Emerald MD  04/18/2022 7:24 PM

## 2022-04-18 NOTE — Consult Note (Signed)
[facility]    Date of Admission:  03/28/2022     Reason for Consult: intraabd abscess, complicated diverticulitis    Referring Provider: Inda Merlin   Lines:  Periphearl liv's  Abx: 1/10-c amp-sulbactam  12/24-1/10 piptazo        Assessment: 77 yo female with hypothyroidism, HFpEF, IPF, admitted 50/38 for complicated diverticulitis s/p rectosigmoid resection with colostomy on 88/28, course complicated by persistent/worsening intra-abd abscess evident by new fever and 1/6 ct abd pelv   Imaging: 1/6 ct abd pelv previous drain removed; worsening pelvic abscess near rectal stump 9cm  Micro: 1/6 bcx ngtd 1/7 new drain obtained abscess culture - e faecalis, bacteroides species  Procedures: 12/26 rectosigmoid resection; end colostomy creation 1/7 IR ct guided 12 Fr perc drain placement left transgluteal approach into pelvis abscess  He has required tpn which is currently being weaned off Patient is s/p new ir drain placement His wbc has remained normal  He was restarted on amp-sulb to reflect drain culture from 1/07  I reviewed surgery note from 04/18/22; "recommend outpatient drain study to r/o fistula stump leak in about 2 weeks"  She is doing much better although still a little bleeding out of rectal stump here and there   Plan: Continue amp-sulbactam while inpatient If a repeat ct this coming week is feasible I would recommend; if the abscess is significantly smaller (as suggested by good output of the current percutaneous drain), then on discharge can change to a long course of amoxicillin-clavulonate 875 mg po bid for 6 weeks Defer picc for now Depending on the drain study mentioned by surgery, we will decide on stopping or extending abx or if other surgical management needed Discussed with primary team   I spent 75 minute reviewing data/chart, and coordinating care and >50% direct face to face time providing  counseling/discussing diagnostics/treatment plan with patient   ------------------------------------------------ Active Problems:   Hypothyroidism   Idiopathic pulmonary fibrosis ([place])   Essential hypertension   Diverticulitis of large intestine with perforation and abscess   Rectal bleeding   Pelvic abscess in female   Acute blood loss anemia   Chronic diastolic CHF (congestive heart failure) ([place])   Toxic metabolic encephalopathy   Ileus following gastrointestinal surgery (HCC)    HPI: Alexandria [name] is a 77 y.o. female  with hypothyroidism, HFpEF, IPF, admitted 00/34 for complicated diverticulitis s/p rectosigmoid resection with colostomy on 91/79, course complicated by persistent/worsening intra-abd abscess evident by new fever and 1/6 ct abd pelv    She underwent surgery and had prolonged iv piptazo,  however has new fever 1/06 with ct showing enlarging abscess. Ir placed drain on 1/07 and she was narrowed from piptazo to amp/sulbactam  The drain is working well. She has also bleeding/passing clot out of her rectum which is improving  No increased osteomy output on abx, rash, n/v She is feeling well    Family History  Problem Relation Age of Onset   Ovarian cancer Mother    Uterine cancer Mother    Lung cancer Father    HIV Brother        63   Kidney cancer Brother    Lung cancer Brother    Other Brother        Mouth Cancer   Colon cancer Maternal Aunt    Heart disease Maternal Grandmother    Stroke Maternal Grandmother    Hypertension Maternal Grandmother    Diabetes  Maternal Grandmother    Arthritis Other    Esophageal cancer Neg Hx    Liver disease Neg Hx    Rectal cancer Neg Hx    Stomach cancer Neg Hx     Social History   Tobacco Use   Smoking status: Never   Smokeless tobacco: Never  Vaping Use   Vaping Use: Never used  Substance Use Topics   Alcohol use: No    Alcohol/week: 0.0 standard drinks of alcohol   Drug use: No    Allergies   Allergen Reactions   Prednisone Other (See Comments)    Changed personality    Dilaudid [Hydromorphone] Itching and Other (See Comments)    Dilaudid caused marked confusion   Atrovent Hfa [Ipratropium Bromide Hfa] Itching and Other (See Comments)    Pt could not sleep   Cheratussin Ac [Guaifenesin-Codeine] Other (See Comments)    Headaches    Diltiazem Itching and Other (See Comments)    Makes patient sick. Shakes. GI   Hydrocodone Itching   Influenza Vaccines Other (See Comments)    Pt reports heart attack after last flu shot   Morphine Itching   Motrin [Ibuprofen] Itching and Other (See Comments)    "Gives false reading in blood"   Oxycodone Itching   Codeine Itching, Nausea Only and Other (See Comments)    Pt takes promethazine with codeine at home    Review of Systems: ROS All Other ROS was negative, except mentioned above   Past Medical History:  Diagnosis Date   Arthritis    Asthmatic bronchitis with acute exacerbation 02/06/2015   Bronchial pneumonia    Colon polyp    Diverticulitis    DVT (deep venous thrombosis) (HCC)    lower extremity   H/O cardiac catheterization 2004   Normal coronary arteries   Heart murmur    History of stress test 05/21/2011   Hx of echocardiogram 01/27/2010   Normal Ef 55% the transmitral spectral doppler flow pattern is normal for age. the left ventricular wall motion is normal   IPF (idiopathic pulmonary fibrosis) (HCC) 01/2018   Migraines    Pancreatitis    Thyroid disease    TIA (transient ischemic attack)    8 years ago       Scheduled Meds:  Chlorhexidine Gluconate Cloth  6 each Topical Daily   feeding supplement  1 Container Oral TID BM   feeding supplement  237 mL Oral BID BM   fentaNYL  1 patch Transdermal Q72H   levothyroxine  112 mcg Oral QAC breakfast   lidocaine  1 patch Transdermal Q24H   lisinopril  10 mg Oral Daily   polyethylene glycol  17 g Oral Daily   sodium chloride flush  3 mL Intravenous Q12H    sodium chloride flush  5 mL Per Tube Q8H   Continuous Infusions:  sodium chloride     sodium chloride 10 mL/hr at 04/17/22 1727   ampicillin-sulbactam (UNASYN) IV 3 g (04/18/22 0956)   ondansetron (ZOFRAN) IV     promethazine (PHENERGAN) injection (IM or IVPB) 200 mL/hr at 04/17/22 1727   TPN ADULT (ION) 40 mL/hr at 04/17/22 1727   PRN Meds:.sodium chloride, acetaminophen, diphenhydrAMINE, fentaNYL (SUBLIMAZE) injection, hydrALAZINE, LORazepam, metoCLOPramide (REGLAN) injection, ondansetron (ZOFRAN) IV **OR** ondansetron (ZOFRAN) IV, phenol, promethazine (PHENERGAN) injection (IM or IVPB), sodium chloride flush, sodium chloride flush, traMADol   OBJECTIVE: Blood pressure (!) 170/80, pulse 73, temperature 98.6 F (37 C), temperature source Oral, resp. rate 17, height '5\' 4"'$  (  1.626 m), weight 72.6 kg, SpO2 96 %.  Physical Exam  General/constitutional: no distress, pleasant HEENT: Normocephalic, PER, Conj Clear, EOMI, Oropharynx clear Neck supple CV: rrr no mrg Lungs: clear to auscultation, normal respiratory effort Abd: Soft, Nontender, colostomy with green fluid output; left lower flank jp drain with murky dark fluid output Ext: no edema Skin: No Rash Neuro: nonfocal MSK: no peripheral joint swelling/tenderness/warmth; back spines nontender   Central line presence: no   Lab Results Lab Results  Component Value Date   WBC 7.3 04/18/2022   HGB 9.8 (L) 04/18/2022   HCT 31.8 (L) 04/18/2022   MCV 101.9 (H) 04/18/2022   PLT 313 04/18/2022    Lab Results  Component Value Date   CREATININE 0.65 04/18/2022   BUN 13 04/18/2022   NA 135 04/18/2022   K 4.0 04/18/2022   CL 101 04/18/2022   CO2 26 04/18/2022    Lab Results  Component Value Date   ALT 30 04/18/2022   AST 33 04/18/2022   ALKPHOS 62 04/18/2022   BILITOT 0.4 04/18/2022      Microbiology: Recent Results (from the past 240 hour(s))  Culture, blood (Routine X 2) w Reflex to ID Panel     Status: None    Collection Time: 04/11/22 12:53 PM   Specimen: Right Antecubital; Blood  Result Value Ref Range Status   Specimen Description   Final    RIGHT ANTECUBITAL BOTTLES DRAWN AEROBIC ONLY Performed at [facility], [place] [address], [place], [place] [address]    Special Requests   Final    Blood Culture adequate volume Performed at [facility] [address], [place], [place] [address]    Culture   Final    NO GROWTH 5 DAYS Performed at [facility], [place] [address], [place], [place] [address]    Report Status 04/16/2022 FINAL  Final  Culture, blood (Routine X 2) w Reflex to ID Panel     Status: None   Collection Time: 04/11/22 12:53 PM   Specimen: BLOOD RIGHT HAND  Result Value Ref Range Status   Specimen Description   Final    BLOOD RIGHT HAND BOTTLES DRAWN AEROBIC ONLY Performed at [facility], [place] [address] [place], [place] [address]    Special Requests   Final    Blood Culture adequate volume Performed at [facility] [address], [place], [place] [address]    Culture   Final    NO GROWTH 5 DAYS Performed at [facility], [place] [address] [place], [place] [address]    Report Status 04/16/2022 FINAL  Final  Aerobic/Anaerobic Culture w Gram Stain (surgical/deep wound)     Status: None   Collection Time: 04/12/22  1:19 PM   Specimen: Abscess  Result Value Ref Range Status   Specimen Description   Final    ABSCESS Performed at [facility] [address], [place], [place] [address]    Special Requests   Final    NONE Performed at [facility], [place] [address], [place], [place] [address]    Gram Stain   Final    ABUNDANT WBC PRESENT, PREDOMINANTLY PMN ABUNDANT GRAM POSITIVE COCCI IN PAIRS IN CLUSTERS FEW GRAM NEGATIVE RODS    Culture   Final    ABUNDANT ENTEROCOCCUS FAECALIS ABUNDANT BACTEROIDES FRAGILIS ABUNDANT BACTEROIDES OVATUS BETA LACTAMASE  POSITIVE Performed at [facility], [place] [address], [place], [place]  [address]    Report Status 04/15/2022 FINAL  Final   Organism ID, Bacteria ENTEROCOCCUS FAECALIS  Final      Susceptibility   Enterococcus faecalis - MIC*    AMPICILLIN <=2 SENSITIVE Sensitive     VANCOMYCIN 2 SENSITIVE Sensitive     GENTAMICIN SYNERGY SENSITIVE Sensitive     * ABUNDANT ENTEROCOCCUS FAECALIS     Serology:    Imaging: If present, new imagings (plain films, ct scans, and mri) have been personally visualized and interpreted; radiology reports have been reviewed. Decision making incorporated into the Impression / Recommendations.  04/11/22 abd pelv ct 1. Interval removal of a surgical drain previously seen in the low pelvis, with increased volume of frothy, loculated appearing air and fluid in the posterior pelvis adjacent to the rectal stump, collection measuring at least 8.9 x 8.1 x 6.7 cm. Findings are consistent with abscess. The stump may be assessed for leak via fluoroscopy if desired. 2. 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 3. Severe anasarca. 4. 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  Jabier Mutton, [facility] [place] [phone] pager    04/18/2022, 11:35 AM

## 2022-04-18 NOTE — Progress Notes (Signed)
PHARMACY - TOTAL PARENTERAL NUTRITION CONSULT NOTE   Indication: Prolonged ileus  Patient Measurements: Height: '5\' 4"'$  (162.6 cm) Weight: 72.6 kg (160 lb) IBW/kg (Calculated) : 54.7 TPN AdjBW (KG): 63 Body mass index is 27.46 kg/m. Usual Weight:   Assessment:  Pharmacy is consulted to dose TPN in 77 yo female with prolonged ileus. Pt  is s/p robotic lower anterior rectosigmoid resection with colostomy and drainage of pelvic abscess by Dr. [name] 12/26.   Glucose / Insulin:  No history of DM, A1c 5.4 (03/18/20).  - CBGs/SSI checks stopped due to minimal usage Electrolytes: all WNL including CorrCa Renal: SCr, BUN stable WNL; UOP not charted Hepatic: LFTs, Tbili WNL; albumin remains low but improved  - TG stable WNL (1/8) I/O: Not completely recorded; 1/6 CT shows anasarca - MIVF: NS @ KVO; PRBC transfusion 04/11/22, 04/13/22 - Output: UOP uncharted, 6 unmeasured occurrences; minimal drain OP GI Imaging: - 62/69: complicated sigmoid diverticulitis. Distal sigmoid colon shows wall thickening and adjacent inflammation. - 1/1 CT: decreasing residual pelvic abscess, ileus, ostomy, distended gallbladder, small left pleural effusion w/ pulm fibrosis - 1/6 CT: pelvic abscess, concern for stump leak GI Surgeries / Procedures:  - 12/26: robotic lower anterior rectosigmoid resection with colostomy and drainage of pelvic abscess  - 1/7 IR transgluteal pelvic drain placement  Central access:  1/4: Replaced PICC TPN start date: 12/30-1/2, restarted 1/4   Nutritional Goals: Goal TPN rate is 75 mL/hr (provides 90 g of protein and 1818 kcals per day)  RD Assessment: Estimated Needs Total Energy Estimated Needs: 1700-1900 kcals Total Protein Estimated Needs: 85-100 g Total Fluid Estimated Needs: >/= 1.7 L  Current Nutrition:  TPN Soft diet 1/11 Boost Breeze TID, Ensure BID (refusing both)  Plan:  Ok per [facility] to wean off TPN today when bag finishes at [place], PharmD,  [place]: [phone] 04/18/2022, 8:51 AM

## 2022-04-19 DIAGNOSIS — D509 Iron deficiency anemia, unspecified: Secondary | ICD-10-CM | POA: Diagnosis present

## 2022-04-19 LAB — COMPREHENSIVE METABOLIC PANEL
ALT: 21 U/L (ref 0–44)
AST: 23 U/L (ref 15–41)
Albumin: 2.2 g/dL — ABNORMAL LOW (ref 3.5–5.0)
Alkaline Phosphatase: 59 U/L (ref 38–126)
Anion gap: 8 (ref 5–15)
BUN: 10 mg/dL (ref 8–23)
CO2: 25 mmol/L (ref 22–32)
Calcium: 8 mg/dL — ABNORMAL LOW (ref 8.9–10.3)
Chloride: 102 mmol/L (ref 98–111)
Creatinine, Ser: 0.59 mg/dL (ref 0.44–1.00)
GFR, Estimated: >60 mL/min (ref >60)
Glucose, Bld: 82 mg/dL (ref 70–99)
Potassium: 3.6 mmol/L (ref 3.5–5.1)
Sodium: 135 mmol/L (ref 135–145)
Total Bilirubin: 0.5 mg/dL (ref 0.3–1.2)
Total Protein: 6.2 g/dL — ABNORMAL LOW (ref 6.5–8.1)

## 2022-04-19 LAB — CBC WITH DIFFERENTIAL/PLATELET
Abs Immature Granulocytes: 0.11 10*3/uL — ABNORMAL HIGH (ref 0.00–0.07)
Basophils Absolute: 0.1 10*3/uL (ref 0.0–0.1)
Basophils Relative: 2 %
Eosinophils Absolute: 0.7 10*3/uL — ABNORMAL HIGH (ref 0.0–0.5)
Eosinophils Relative: 11 %
HCT: 30.2 % — ABNORMAL LOW (ref 36.0–46.0)
Hemoglobin: 9.5 g/dL — ABNORMAL LOW (ref 12.0–15.0)
Immature Granulocytes: 2 %
Lymphocytes Relative: 21 %
Lymphs Abs: 1.3 10*3/uL (ref 0.7–4.0)
MCH: 31.9 pg (ref 26.0–34.0)
MCHC: 31.5 g/dL (ref 30.0–36.0)
MCV: 101.3 fL — ABNORMAL HIGH (ref 80.0–100.0)
Monocytes Absolute: 1.5 10*3/uL — ABNORMAL HIGH (ref 0.1–1.0)
Monocytes Relative: 23 %
Neutro Abs: 2.6 10*3/uL (ref 1.7–7.7)
Neutrophils Relative %: 41 %
Platelets: 318 10*3/uL (ref 150–400)
RBC: 2.98 MIL/uL — ABNORMAL LOW (ref 3.87–5.11)
RDW: 15.9 % — ABNORMAL HIGH (ref 11.5–15.5)
WBC: 6.3 10*3/uL (ref 4.0–10.5)
nRBC: 0 % (ref 0.0–0.2)

## 2022-04-19 LAB — MAGNESIUM: Magnesium: 2.1 mg/dL (ref 1.7–2.4)

## 2022-04-19 MED ORDER — PSYLLIUM 95 % PO PACK
1.0000 | PACK | Freq: Every day | ORAL | Status: DC
  Administered 2022-04-19 (×2): 1 via ORAL
  Filled 2022-04-19 (×4): qty 1

## 2022-04-19 MED ORDER — POLYETHYLENE GLYCOL 3350 17 G PO PACK: 17.0000 g | PACK | Freq: Two times a day (BID) | ORAL | Status: DC | PRN

## 2022-04-19 NOTE — Progress Notes (Signed)
PROGRESS NOTE   Alexandria [name]  KNL:[id number] DOB: June 24, 1945 DOA: 03/28/2022 PCP: [name], DO   Date of Service: the patient was seen and examined on 04/19/2022  Brief Narrative:  77 year old female with past medical history of hypothyroidism, chronic diastolic ingestive heart failure (echo 06/2020 EF 60-65% with G1DD), idiopathic pulmonary fibrosis (follows with Dr. [name] at [facility]), hypertension, diverticulosis and recent identification of a rectosigmoid mass presenting with abdominal pain and constipation.    Of note,  CT 03/20/2022 status post flexible sigmoidoscopy performed that same day by Dr. [name] with initial biopsy revealing no dysplasia or malignancy.  Patient had a follow-up MRI 12/21 revealing severe sigmoid diverticulosis with concentric wall thickening involving the distal sigmoid colon and upper rectum and adjacent 3.6 cm multiloculated fluid collection favoring diverticulitis and diverticular abscess (but not read until 12/26).  Upon eventual presentation to [facility] emergency department on 12/23 patient reported not having a bowel movement for 7 days.  At that time, MRI results were not yet available and patient was hospitalized for severe constipation.  Hospitalist group was called and patient was admitted to the hospital.  Dr. [name] and Dr. [name] with general surgery was consulted.  CT imaging of the abdomen pelvis performed on 12/24 in addition to 12/21 MRI seems consistent with peridiverticular abscess formation.  Patient was placed on intravenous antibiotic therapy at that time.  Interventional radiology was consulted but unfortunately drainage could not be performed.  Patient eventually underwent rectosigmoid resection with colostomy performed by Dr. [name] on 03/31/2022 with continued antibiotics afterwards.  On 04/11/2022 patient began to develop recurrence of fever and worsening abdominal pain with rectal bleeding.  Repeat CT imaging  consistent with new abscess formation measuring 8.9 x 8.1 x 6.7 cm with concern for leakage from the stump.  On 04/12/2022 patient underwent IR guided drainage of the intra-abdominal abscess.  Cultures from 1/7 abscess drainage growing out Enterococcus faecalis.  Hospitalization is also been complicated by postoperative ileus with requiring temporary use of TPN.  This has since been discontinued on 1/13.  Patient also received a total of 2 units of transfused packed red blood cells throughout the hospitalization for recurrent anemia with last transfusion being on 1/8.       Assessment and Plan: Diverticulitis of large intestine with perforation and abscess CT imaging of abdomen and pelvis performed 12/24 as well as 12/21 MRI consistent with peridiverticular abscess formation Patient status post rectosigmoid resection with colostomy performed by Dr. [name] on 12/26 Patient additionally developed new abscess formation in the pelvis identified on repeat CT imaging 1/6 status post IR guided drainage placement 1/7. General surgery following daily, their input is appreciated Interventional radiology additionally following daily for management of patient's pelvic abscess drain, their input is also appreciated. I have additionally consulted Dr. [name] with infectious disease to review and provide guidance on the selection and course of antibiotics for patient's Enterococcus faecalis infection.  Their input is also appreciated. Cultures from 1/7 growing Enterococcus faecalis Continue to treat with intravenous Unasyn for now. Patient will likely require repeat CT imaging of the abdomen and pelvis to reassess abscess prior to discharge we will tentatively plan for Monday in anticipation for possible discharge 24 to 48 hours after that.   Pelvic abscess in female Please see assessment and plan above  Ileus following gastrointestinal surgery ([place]) Prolonged postoperative ileus and inability to take p.o. intake  prompting initiation of TPN Pharmacy has been managing TPN thus far, their input  is appreciated. Weaning TPN off per guidance from general surgery Patient has been placed back on a soft diet.  Encouraging patient to continue to increase oral intake and encouraging family to bring appropriate foods for patient to eat that she enjoys. Monitoring electrolytes closely and replacing as necessary  Toxic metabolic encephalopathy Prolonged course of multifactorial delirium thought to be secondary to prolonged hospitalization, infection and polypharmacy This has since resolved  Acute blood loss anemia Patient received a total of 4 units of packed red blood cells transfused during this hospitalization with 2 units transfused on 1/8 No clinical evidence of bleeding currently with exception of serosanguineous drainage in the patient's percutaneous drain No active rectal bleeding Hemoglobin and hematocrit remain stable. Monitoring hemoglobin and hematocrit with serial CBCs  Chronic diastolic CHF (congestive heart failure) (HCC) No clinical evidence of cardiogenic volume overload Strict input and output monitoring Daily weights    Idiopathic pulmonary fibrosis (HCC) Longstanding known history, follows with Dr. [name] at [place] [facility] No active symptoms Outpatient follow up  Essential hypertension Initiating low-dose lisinopril at this time and will uptitrate slowly considering persisting hypertension Somewhat relaxed blood pressure targets due to patient's advanced age. As needed intravenous antihypertensives for markedly elevated blood pressure.  Rectal bleeding Intermittently occurring earlier in the hospitalization which has since stopped Hemoglobin and hematocrit stable  Hypothyroidism Resume home regimen of Synthroid    Subjective:  Patient reports continued slow improvement in abdominal pain.  Patient's pain is moderate, sharp, diffusely radiating.  Patient reports  that she ate some soup that her daughter brought from home today.   Physical Exam:  Vitals:   04/18/22 1329 04/18/22 1947 04/19/22 0518 04/19/22 1322  BP: (!) 176/87 (!) 147/67 (!) 151/69 (!) 141/66  Pulse: 75 77 70 71  Resp: '16 18 16 16  '$ Temp: 97.8 F (36.6 C) 98.5 F (36.9 C) 98.4 F (36.9 C) 98 F (36.7 C)  TempSrc:  Oral Oral Oral  SpO2: 97% 97% 96% 97%  Weight:      Height:        Constitutional: Awake alert and oriented x3, no associated distress.   Skin: no rashes, no lesions, good skin turgor noted. Eyes: Pupils are equally reactive to light.  No evidence of scleral icterus or conjunctival pallor.  ENMT: Moist mucous membranes noted.  Posterior pharynx clear of any exudate or lesions.   Respiratory: Notable bibasilar rales, worse in the right field, unchanged from yesterday.  No evidence of wheezing.  Normal respiratory effort. No accessory muscle use.  Cardiovascular: Regular rate and rhythm, no murmurs / rubs / gallops. No extremity edema. 2+ pedal pulses. No carotid bruits.  Abdomen: Abdomen is diffusely tender but soft.  Ostomy in place with pink mucosa and dark brown stool.  Notable percutaneous JP drain extending from the pelvis with serosanguineous drainage with foul smell.  Hypoactive sounds noted. Musculoskeletal: No joint deformity upper and lower extremities. Good ROM, no contractures.  Normal motor tone.   Data Reviewed:  I have personally reviewed and interpreted labs, imaging.  Significant findings are   CBC: Recent Labs  Lab 04/15/22 0414 04/16/22 0333 04/17/22 0342 04/18/22 0355 04/19/22 0303  WBC 8.4 7.1 7.2 7.3 6.3  NEUTROABS 4.5 3.3 3.0 3.0 2.6  HGB 9.6* 9.3* 9.7* 9.8* 9.5*  HCT 29.9* 29.2* 30.6* 31.8* 30.2*  MCV 99.0 100.3* 102.7* 101.9* 101.3*  PLT 313 288 303 313 902   Basic Metabolic Panel: Recent Labs  Lab 04/13/22 0229 04/14/22 0330 04/15/22 0414  04/16/22 0333 04/17/22 0342 04/18/22 0355 04/19/22 0303  NA 136 135 133* 138  135 135 135  K 3.9 3.8 3.9 4.0 4.3 4.0 3.6  CL 105 104 102 107 102 101 102  CO2 '24 25 25 26 26 26 25  '$ GLUCOSE 109* 104* 118* 124* 100* 84 82  BUN '13 11 14 12 14 13 10  '$ CREATININE 0.55 0.53 0.59 0.56 0.54 0.65 0.59  CALCIUM 7.8* 8.0* 7.9* 7.9* 8.0* 8.1* 8.0*  MG 1.9 2.2  --  2.5* 2.1 2.3 2.1  PHOS 3.2  --   --  2.7  --   --   --    GFR: Estimated Creatinine Clearance: 57.5 mL/min (by C-G formula based on SCr of 0.59 mg/dL). Liver Function Tests: Recent Labs  Lab 04/13/22 0229 04/16/22 0333 04/17/22 0342 04/18/22 0355 04/19/22 0303  AST 31 33 33 33 23  ALT '20 24 28 30 21  '$ ALKPHOS 52 56 60 62 59  BILITOT 0.5 0.4 0.3 0.4 0.5  PROT 5.4* 6.2* 6.2* 6.3* 6.2*  ALBUMIN 1.7* 2.0* 2.1* 2.1* 2.2*     Code Status:  Full code.  Code status decision has been confirmed with: patient Family Communication: Plan of care discussed with daughter and husband who are both at the bedside.   Severity of Illness:  The appropriate patient status for this patient is INPATIENT. Inpatient status is judged to be reasonable and necessary in order to provide the required intensity of service to ensure the patient's safety. The patient's presenting symptoms, physical exam findings, and initial radiographic and laboratory data in the context of their chronic comorbidities is felt to place them at high risk for further clinical deterioration. Furthermore, it is not anticipated that the patient will be medically stable for discharge from the hospital within 2 midnights of admission.   * I certify that at the point of admission it is my clinical judgment that the patient will require inpatient hospital care spanning beyond 2 midnights from the point of admission due to high intensity of service, high risk for further deterioration and high frequency of surveillance required.*  Time spent:  51 minutes  Author:  Vernelle Emerald MD  04/19/2022 8:52 PM

## 2022-04-19 NOTE — Progress Notes (Signed)
Alexandria [name] [id number] 1945/05/18  CARE TEAM:  PCP: [name], DO  Outpatient Care Team: Patient Care Team: Carollee Herter, Alferd Apa, DO as PCP - General (Family Medicine) Berniece Salines, DO as PCP - Cardiology (Cardiology) Maisie Fus, MD (Inactive) as Consulting Physician (Obstetrics and Gynecology) Berniece Salines, DO as Consulting Physician (Cardiology) Sharyn Creamer, MD as Consulting Physician (Gastroenterology)  Inpatient Treatment Team: Treatment Team: Attending Provider: Vernelle Emerald, MD; Rounding Team: Edison Pace, Md, MD; [place] Nurse: McNichol, Rudi Heap, RN; Rounding Team: Dorthy Cooler Radiology, MD; Rounding Team: Fatima Blank, MD; Technician: Ernest Mallick, [place]; Respiratory Therapist: Dia Crawford, RRT; Registered Nurse: Dorinda Hill, RN; Utilization Review: Erlenmeyer, Areta Haber, RN   Problem List:   Principal Problem:   Diverticulitis of large intestine with perforation and abscess Active Problems:   Hypothyroidism   Hx of myocardial infarction   Idiopathic pulmonary fibrosis ([place])   Essential hypertension   Rectal bleeding   Pelvic abscess in female   Acute blood loss anemia   Chronic diastolic CHF (congestive heart failure) (HCC)   Toxic metabolic encephalopathy   Ileus following gastrointestinal surgery (HCC)   Intra-abdominal abscess (HCC)   IDA (iron deficiency anemia)   19 Days Post-Op  03/31/2022  POST-OPERATIVE DIAGNOSIS:  Rectosigmoid stricture with perforation & abscess, 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  Quite kinked and folded upon itself.  At the epicenter of the folded rectosigmoid mesentery was an obvious  abscess with contained perforation and some stool.  Rectum itself did not seem particularly inflamed.  No definite endoluminal mass up to the untwisted tumor but thickened and abnormal.   No obvious metastatic disease on visceral parietal peritoneum or liver.   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  Rectal stump to 11 cm with suture.   CASE DATA:   Type of patient?: LDOW CASE (Surgical Hospitalist [facility] Inpatient)   Status of Case? URGENT Add On   Infection Present At Time Of Surgery (PATOS)?  ABSCESS     Assessment  Gradually recovering  [facility] Stay = 21 days)  Plan: Pathology consistent with diverticulitis. - OR findings of rectosigmoid stricture with perforation & abscess, probable diverticulitis.  No malignancy -discussed with patient and copy in chart. - CT 1/6 with pelvic abscess vs hematoma  s/p IR drain placement 1/7,  cxs with enterococcus -sensitive to ampicillin. Antibiotics switched from piperacillin/tazobactam to ampicillin/sulbactam 1/10.  Would recommend 7-day course = finish 1/17. -If patient leaves sooner, most likely can switch to oral Augmentin to complete post drainage course -Would recommend outpatient drain study to rule out fistula stump leak in about 2 weeks.  -Suspect rectal irritation and bleeding related to irritation around the rectal stump with a recurrent abscess.  Could raise concern of stump leak but no evidence of any feculent material and not declining.  Hemoglobin is stable arguing against any major blood loss.  Should continue to taper off.  -Solid heart diet.  Patient still wanting to mainly be on full liquids.  Encouraged her to try solid food.  MiraLAX bowel regimen.  Already on half TPN.  Wean off.   - hgb 9.7 s/p 2  units prbc 1/8, stable.  She would benefit from IV iron but she declines taking it. - mobilize!!!.  Actually motivated to try and go to the bathroom by herself.  Has a plan to go  urinate first.  That seems to help.  No more complaints of rectal bleeding discussed with nursing. - WOC following for new ostomy - overall doing well with it   FEN: weaned TPN to off 1/13 evening.  Heart diet.  Supplemental shakes and vitamins. ID: zosyn 12/31>1/5; Zosyn 1/6>1/10; unasyn 1/10>>1/17.  Primary service decided to consult ID who agrees.  VTE: LMWH   - below per TRH -  Chronic diastolic HF Hypothyroidism Pulmonary fibrosis HTN     Disposition: Suspect patient is gaining point where it stable to transition out of the hospital 1/15.  Patient requests and would benefit from home health if that is possible to help with flushes and drain care. Disposition:  The patient is from: Home  Anticipate discharge to:  Home with Home Health  Anticipated Date of Discharge is:  January 15,2024`   Barriers to discharge:  Pending Clinical improvement (more likely than not)  Patient currently is NOT MEDICALLY STABLE for discharge from the hospital from a surgery standpoint.      I reviewed nursing notes, hospitalist notes, last 24 h vitals and pain scores, last 48 h intake and output, last 24 h labs and trends, and last 24 h imaging results. I have reviewed this patient's available data, including medical history, events of note, test results, etc as part of my evaluation.  A significant portion of that time was spent in counseling.  Care during the described time interval was provided by me.  This care required moderate level of medical decision making.  04/19/2022    Subjective: (Chief complaint)  Patient with less pain.  Tolerating soft diet.  Did walk with a walking assistant but complains she is not getting the help to get up.  Once bathroom privileges.  Still with some rectal drainage but does admit it is tapered off.  Colostomy working better.  Wanting to wait another week before going home.  Objective:  Vital signs:  Vitals:   04/18/22 0510 04/18/22 1329  04/18/22 1947 04/19/22 0518  BP: (!) 170/80 (!) 176/87 (!) 147/67 (!) 151/69  Pulse: 73 75 77 70  Resp:  '16 18 16  '$ Temp: 98.6 F (37 C) 97.8 F (36.6 C) 98.5 F (36.9 C) 98.4 F (36.9 C)  TempSrc: Oral  Oral Oral  SpO2: 96% 97% 97% 96%  Weight:      Height:        Last BM Date : 04/18/22  Intake/Output   Yesterday:  01/13 0701 - 01/14 0700 In: 1326.5 [P.O.:720; I.V.:506.4; IV Piggyback:100.2] Out: 80 [Drains:30; Stool:50] This shift:  No intake/output data recorded.  Bowel function:  Flatus: YES  BM:  YES  Drain: Serosanguinous   Physical Exam:  General: Pt awake/alert in no acute distress Eyes: PERRL, normal EOM.  Sclera clear.  No icterus Neuro: CN II-XII intact w/o focal sensory/motor deficits. Lymph: No head/neck/groin lymphadenopathy Psych:  No delerium/psychosis/paranoia.  Oriented x 4 HENT: Normocephalic, Mucus membranes moist.  No thrush Neck: Supple, No tracheal deviation.  No obvious thyromegaly Chest: No pain to chest wall compression.  Good respiratory excursion.  No audible wheezing CV:  Pulses intact.  Regular rhythm.  No major extremity edema MS: Normal AROM mjr joints.  No obvious deformity  Abdomen: Soft.  Nondistended.  Tenderness at incision  mild.  Incision well-closed with no evidence of any cellulitis nor abscess. .  No evidence of peritonitis.  No incarcerated hernias.  Ext:   No deformity.  No mjr edema.  No cyanosis Skin: No petechiae / purpurea.  No major sores.  Warm and dry    Results:   Cultures: Recent Results (from the past 720 hour(s))  Culture, blood (Routine X 2) w Reflex to ID Panel     Status: None   Collection Time: 04/11/22 12:53 PM   Specimen: Right Antecubital; Blood  Result Value Ref Range Status   Specimen Description   Final    RIGHT ANTECUBITAL BOTTLES DRAWN AEROBIC ONLY Performed at [facility] [address], [place], [place] [address]    Special Requests   Final    Blood Culture  adequate volume Performed at [facility] [address] [place], [place] [address]    Culture   Final    NO GROWTH 5 DAYS Performed at [facility], [place] [address] [place], [place] [address]    Report Status 04/16/2022 FINAL  Final  Culture, blood (Routine X 2) w Reflex to ID Panel     Status: None   Collection Time: 04/11/22 12:53 PM   Specimen: BLOOD RIGHT HAND  Result Value Ref Range Status   Specimen Description   Final    BLOOD RIGHT HAND BOTTLES DRAWN AEROBIC ONLY Performed at [facility], [place] [address], [place], [place] [address]    Special Requests   Final    Blood Culture adequate volume Performed at [facility] [address], [place], [place] [address]    Culture   Final    NO GROWTH 5 DAYS Performed at [facility], [place] [address] [place] [address], [place], [place] [address]    Report Status 04/16/2022 FINAL  Final  Aerobic/Anaerobic Culture w Gram Stain (surgical/deep wound)     Status: None   Collection Time: 04/12/22  1:19 PM   Specimen: Abscess  Result Value Ref Range Status   Specimen Description   Final    ABSCESS Performed at [facility] [address] [place], [place] [address]    Special Requests   Final    NONE Performed at [facility], [place] [address], [place], [place] [address]    Gram Stain   Final    ABUNDANT WBC PRESENT, PREDOMINANTLY PMN ABUNDANT GRAM POSITIVE COCCI IN PAIRS IN CLUSTERS FEW GRAM NEGATIVE RODS    Culture   Final    ABUNDANT ENTEROCOCCUS FAECALIS ABUNDANT BACTEROIDES FRAGILIS ABUNDANT BACTEROIDES OVATUS BETA LACTAMASE POSITIVE Performed at [facility], [place] [address], [place], [place] [address]    Report Status 04/15/2022 FINAL  Final   Organism ID, Bacteria ENTEROCOCCUS FAECALIS  Final      Susceptibility   Enterococcus faecalis - MIC*    AMPICILLIN <=2 SENSITIVE Sensitive     VANCOMYCIN 2 SENSITIVE Sensitive     GENTAMICIN  SYNERGY SENSITIVE Sensitive     * ABUNDANT ENTEROCOCCUS FAECALIS    Labs: Results for orders placed or performed during the hospital encounter of 03/28/22 (from the past 48 hour(s))  CBC with Differential/Platelet     Status: Abnormal   Collection Time: 04/18/22  3:55 AM  Result Value Ref Range   WBC 7.3 4.0 - 10.5 K/uL   RBC 3.12 (L) 3.87 - 5.11 MIL/uL   Hemoglobin 9.8 (L) 12.0 - 15.0 g/dL  HCT 31.8 (L) 36.0 - 46.0 %   MCV 101.9 (H) 80.0 - 100.0 fL   MCH 31.4 26.0 - 34.0 pg   MCHC 30.8 30.0 - 36.0 g/dL   RDW 16.1 (H) 11.5 - 15.5 %   Platelets 313 150 - 400 K/uL   nRBC 0.0 0.0 - 0.2 %   Neutrophils Relative % 41 %   Neutro Abs 3.0 1.7 - 7.7 K/uL   Lymphocytes Relative 20 %   Lymphs Abs 1.4 0.7 - 4.0 K/uL   Monocytes Relative 24 %   Monocytes Absolute 1.7 (H) 0.1 - 1.0 K/uL   Eosinophils Relative 11 %   Eosinophils Absolute 0.8 (H) 0.0 - 0.5 K/uL   Basophils Relative 2 %   Basophils Absolute 0.2 (H) 0.0 - 0.1 K/uL   Immature Granulocytes 2 %   Abs Immature Granulocytes 0.11 (H) 0.00 - 0.07 K/uL    Comment: Performed at [facility], [place] [address], [place], [place] [address]  Comprehensive metabolic panel     Status: Abnormal   Collection Time: 04/18/22  3:55 AM  Result Value Ref Range   Sodium 135 135 - 145 mmol/L   Potassium 4.0 3.5 - 5.1 mmol/L   Chloride 101 98 - 111 mmol/L   CO2 26 22 - 32 mmol/L   Glucose, Bld 84 70 - 99 mg/dL    Comment: Glucose reference range applies only to samples taken after fasting for at least 8 hours.   BUN 13 8 - 23 mg/dL   Creatinine, Ser 0.65 0.44 - 1.00 mg/dL   Calcium 8.1 (L) 8.9 - 10.3 mg/dL   Total Protein 6.3 (L) 6.5 - 8.1 g/dL   Albumin 2.1 (L) 3.5 - 5.0 g/dL   AST 33 15 - 41 U/L   ALT 30 0 - 44 U/L   Alkaline Phosphatase 62 38 - 126 U/L   Total Bilirubin 0.4 0.3 - 1.2 mg/dL   GFR, Estimated >60 >60 mL/min    Comment: (NOTE) Calculated using the CKD-EPI Creatinine Equation (2021)    Anion gap 8 5 - 15     Comment: Performed at [facility], [place] [address], [place], [place] [address]  Magnesium     Status: None   Collection Time: 04/18/22  3:55 AM  Result Value Ref Range   Magnesium 2.3 1.7 - 2.4 mg/dL    Comment: Performed at [facility], [place] [address], [place], [place] [address]  Vitamin B12     Status: None   Collection Time: 04/18/22 12:25 PM  Result Value Ref Range   Vitamin B-12 640 180 - 914 pg/mL    Comment: (NOTE) This assay is not validated for testing neonatal or myeloproliferative syndrome specimens for Vitamin B12 levels. Performed at [facility], [place] [address] [place], [place] [address]   Folate     Status: None   Collection Time: 04/18/22 12:25 PM  Result Value Ref Range   Folate 10.8 >5.9 ng/mL    Comment: Performed at [facility], [place] [address] [place], [place] [address]  Iron and TIBC     Status: Abnormal   Collection Time: 04/18/22 12:25 PM  Result Value Ref Range   Iron 27 (L) 28 - 170 ug/dL   TIBC 209 (L) 250 - 450 ug/dL   Saturation Ratios 13 10.4 - 31.8 %   UIBC 182 ug/dL    Comment: Performed at [facility], [place] [address]  [place], [place] [address]  C-reactive protein     Status: Abnormal   Collection Time: 04/18/22 12:25 PM  Result Value Ref Range   CRP 5.4 (H) <1.0 mg/dL    Comment: Performed at [facility], [place] [address] [place], [place] [address]  CBC with Differential/Platelet     Status: Abnormal   Collection Time: 04/19/22  3:03 AM  Result Value Ref Range   WBC 6.3 4.0 - 10.5 K/uL   RBC 2.98 (L) 3.87 - 5.11 MIL/uL   Hemoglobin 9.5 (L) 12.0 - 15.0 g/dL   HCT 30.2 (L) 36.0 - 46.0 %   MCV 101.3 (H) 80.0 - 100.0 fL   MCH 31.9 26.0 - 34.0 pg   MCHC 31.5 30.0 - 36.0 g/dL   RDW 15.9 (H) 11.5 - 15.5 %   Platelets 318 150 - 400 K/uL   nRBC 0.0 0.0 - 0.2 %   Neutrophils Relative % 41 %   Neutro Abs 2.6 1.7 - 7.7 K/uL   Lymphocytes  Relative 21 %   Lymphs Abs 1.3 0.7 - 4.0 K/uL   Monocytes Relative 23 %   Monocytes Absolute 1.5 (H) 0.1 - 1.0 K/uL   Eosinophils Relative 11 %   Eosinophils Absolute 0.7 (H) 0.0 - 0.5 K/uL   Basophils Relative 2 %   Basophils Absolute 0.1 0.0 - 0.1 K/uL   Immature Granulocytes 2 %   Abs Immature Granulocytes 0.11 (H) 0.00 - 0.07 K/uL    Comment: Performed at [facility], [place] [address] [place], [place] [address]  Comprehensive metabolic panel     Status: Abnormal   Collection Time: 04/19/22  3:03 AM  Result Value Ref Range   Sodium 135 135 - 145 mmol/L   Potassium 3.6 3.5 - 5.1 mmol/L   Chloride 102 98 - 111 mmol/L   CO2 25 22 - 32 mmol/L   Glucose, Bld 82 70 - 99 mg/dL    Comment: Glucose reference range applies only to samples taken after fasting for at least 8 hours.   BUN 10 8 - 23 mg/dL   Creatinine, Ser 0.59 0.44 - 1.00 mg/dL   Calcium 8.0 (L) 8.9 - 10.3 mg/dL   Total Protein 6.2 (L) 6.5 - 8.1 g/dL   Albumin 2.2 (L) 3.5 - 5.0 g/dL   AST 23 15 - 41 U/L   ALT 21 0 - 44 U/L   Alkaline Phosphatase 59 38 - 126 U/L   Total Bilirubin 0.5 0.3 - 1.2 mg/dL   GFR, Estimated >60 >60 mL/min    Comment: (NOTE) Calculated using the CKD-EPI Creatinine Equation (2021)    Anion gap 8 5 - 15    Comment: Performed at [facility], [place] [address] [place], [place] [address]  Magnesium     Status: None   Collection Time: 04/19/22  3:03 AM  Result Value Ref Range   Magnesium 2.1 1.7 - 2.4 mg/dL    Comment: Performed at [facility], [place] [address] [place], [place] [address]    Imaging / Studies: No results found.  Medications / Allergies: per chart  Antibiotics: Anti-infectives (From admission, onward)    Start     Dose/Rate Route Frequency Ordered Stop   04/18/22 1000  Ampicillin-Sulbactam (UNASYN) 3 g in sodium chloride 0.9 % 100 mL IVPB        3 g 200 mL/hr over 30 Minutes Intravenous Every 6 hours 04/18/22 0901  04/21/22 0959   04/15/22 1200  ampicillin (OMNIPEN) 1 g in sodium chloride 0.9 % 100 mL IVPB  Status:  Discontinued        1 g 300 mL/hr over 20 Minutes Intravenous Every 6 hours 04/15/22 1044 04/15/22 1053   04/15/22 1200  Ampicillin-Sulbactam (UNASYN) 3 g in sodium chloride 0.9 % 100 mL IVPB        3 g 200 mL/hr over 30 Minutes Intravenous Every 6 hours 04/15/22 1053 04/18/22 0535   04/11/22 1400  piperacillin-tazobactam (ZOSYN) IVPB 3.375 g  Status:  Discontinued        3.375 g 12.5 mL/hr over 240 Minutes Intravenous Every 8 hours 04/11/22 1143 04/15/22 1044   04/05/22 1600  piperacillin-tazobactam (ZOSYN) IVPB 3.375 g        3.375 g 12.5 mL/hr over 240 Minutes Intravenous Every 8 hours 04/05/22 1222 04/10/22 1230   03/31/22 1045  cefoTEtan (CEFOTAN) 2 g in sodium chloride 0.9 % 100 mL IVPB        2 g 200 mL/hr over 30 Minutes Intravenous On call to O.R. 03/31/22 0959 03/31/22 1225   03/29/22 0800  piperacillin-tazobactam (ZOSYN) IVPB 3.375 g  Status:  Discontinued        3.375 g 12.5 mL/hr over 240 Minutes Intravenous Every 8 hours 03/29/22 0746 04/05/22 0826         Note: Portions of this report may have been transcribed using voice recognition software. Every effort was made to ensure accuracy; however, inadvertent computerized transcription errors may be present.   Any transcriptional errors that result from this process are unintentional.    Adin Hector, MD, FACS, MASCRS Esophageal, Gastrointestinal & Colorectal Surgery Robotic and Minimally Invasive Surgery  [facility]. [address], [place], [place] [address] [phone] Fax [phone] Main  CONTACT INFORMATION:  Weekday (9AM-5PM): Call [facility] main office at [phone]  Weeknight (5PM-9AM) or Weekend/Holiday: Check [email or web address] (password " [email or web address]") for General Surgery [facility] coverage  (Please, do not use SecureChat as it is not reliable  communication to reach operating surgeons for immediate patient care given surgeries/outpatient duties/clinic/cross-coverage/off post-call which would lead to a delay in care.  Epic staff messaging available for outptient concerns, but may not be answered for 48 hours or more).     04/19/2022  8:07 AM

## 2022-04-20 ENCOUNTER — Inpatient Hospital Stay (HOSPITAL_COMMUNITY): Payer: Medicare Other

## 2022-04-20 ENCOUNTER — Encounter (HOSPITAL_COMMUNITY): Payer: Self-pay | Admitting: Internal Medicine

## 2022-04-20 LAB — CBC WITH DIFFERENTIAL/PLATELET
Abs Immature Granulocytes: 0.08 10*3/uL — ABNORMAL HIGH (ref 0.00–0.07)
Basophils Absolute: 0.1 10*3/uL (ref 0.0–0.1)
Basophils Relative: 2 %
Eosinophils Absolute: 0.6 10*3/uL — ABNORMAL HIGH (ref 0.0–0.5)
Eosinophils Relative: 10 %
HCT: 29.2 % — ABNORMAL LOW (ref 36.0–46.0)
Hemoglobin: 9.1 g/dL — ABNORMAL LOW (ref 12.0–15.0)
Immature Granulocytes: 2 %
Lymphocytes Relative: 27 %
Lymphs Abs: 1.4 10*3/uL (ref 0.7–4.0)
MCH: 31.6 pg (ref 26.0–34.0)
MCHC: 31.2 g/dL (ref 30.0–36.0)
MCV: 101.4 fL — ABNORMAL HIGH (ref 80.0–100.0)
Monocytes Absolute: 1.4 10*3/uL — ABNORMAL HIGH (ref 0.1–1.0)
Monocytes Relative: 26 %
Neutro Abs: 1.8 10*3/uL (ref 1.7–7.7)
Neutrophils Relative %: 33 %
Platelets: 330 10*3/uL (ref 150–400)
RBC: 2.88 MIL/uL — ABNORMAL LOW (ref 3.87–5.11)
RDW: 15.7 % — ABNORMAL HIGH (ref 11.5–15.5)
WBC: 5.4 10*3/uL (ref 4.0–10.5)
nRBC: 0 % (ref 0.0–0.2)

## 2022-04-20 LAB — COMPREHENSIVE METABOLIC PANEL
ALT: 18 U/L (ref 0–44)
AST: 20 U/L (ref 15–41)
Albumin: 2.1 g/dL — ABNORMAL LOW (ref 3.5–5.0)
Alkaline Phosphatase: 53 U/L (ref 38–126)
Anion gap: 6 (ref 5–15)
BUN: 9 mg/dL (ref 8–23)
CO2: 25 mmol/L (ref 22–32)
Calcium: 7.7 mg/dL — ABNORMAL LOW (ref 8.9–10.3)
Chloride: 102 mmol/L (ref 98–111)
Creatinine, Ser: 0.67 mg/dL (ref 0.44–1.00)
GFR, Estimated: >60 mL/min (ref >60)
Glucose, Bld: 85 mg/dL (ref 70–99)
Potassium: 3.2 mmol/L — ABNORMAL LOW (ref 3.5–5.1)
Sodium: 133 mmol/L — ABNORMAL LOW (ref 135–145)
Total Bilirubin: 0.6 mg/dL (ref 0.3–1.2)
Total Protein: 6 g/dL — ABNORMAL LOW (ref 6.5–8.1)

## 2022-04-20 LAB — PHOSPHORUS: Phosphorus: 3 mg/dL (ref 2.5–4.6)

## 2022-04-20 LAB — MAGNESIUM: Magnesium: 2 mg/dL (ref 1.7–2.4)

## 2022-04-20 MED ORDER — POTASSIUM CHLORIDE CRYS ER 20 MEQ PO TBCR
40.0000 meq | EXTENDED_RELEASE_TABLET | Freq: Once | ORAL | Status: AC
  Administered 2022-04-20: 40 meq via ORAL
  Filled 2022-04-20: qty 2

## 2022-04-20 MED ORDER — IOHEXOL 300 MG/ML  SOLN
100.0000 mL | Freq: Once | INTRAMUSCULAR | Status: AC | PRN
  Administered 2022-04-20: 100 mL via INTRAVENOUS

## 2022-04-20 MED ORDER — SODIUM CHLORIDE (PF) 0.9 % IJ SOLN
INTRAMUSCULAR | Status: AC
  Filled 2022-04-20: qty 50

## 2022-04-20 MED ORDER — SODIUM CHLORIDE 0.9 % IV SOLN
250.0000 mg | Freq: Once | INTRAVENOUS | Status: AC
  Administered 2022-04-20: 250 mg via INTRAVENOUS
  Filled 2022-04-20: qty 20

## 2022-04-20 NOTE — Consult Note (Signed)
[place] Nurse ostomy follow up Stoma type/location: LLQ, colostomy Stomal assessment/size: 1 1/4" round, slightly above skin level, pink, moist Peristomal assessment: intact, some mild erythema noted circumferential  Treatment options for stomal/peristomal skin: Crusting with barrier powder and applied 2" barrier ring Output minimal soft brown  Ostomy pouching: 2 pc 2 1/4" skin barrier and pouch,  2" skin barrier ring Education provided:  Patient able to remove old pouch, cleaned around stoma with just water moistened washcloth.  This RN did measure stoma, has decreased in size to 1 1/4".  Discussed this with patient, new pattern made. Patient was able to cut her new skin barrier without cueing.  Patient placed 2" barrier ring on back of skin barrier and secured skin barrier around stoma.  Patient was able to snap pouch onto new skin barrier at this visit.  We discussed and demonstrated burping of the pouch today.  Patient states she has been emptying bag independently since our last visit.    Per TOC note patient will be receiving home health, would also benefit from a referral to outpatient ostomy clinic.       Enrolled patient in [place] Start Discharge program: Yes   WOC will continue to follow this patient for further education and ostomy support.    Thank you,    Criselda Starke MSN, RN-BC, [employer]

## 2022-04-20 NOTE — Progress Notes (Addendum)
PROGRESS NOTE   Alexandria [name]  WUJ:[id number] DOB: 04-26-1945 DOA: 03/28/2022 PCP: [name], DO   Date of Service: the patient was seen and examined on 04/20/2022  Brief Narrative:  77 year old female with past medical history of hypothyroidism, chronic diastolic ingestive heart failure (echo 06/2020 EF 60-65% with G1DD), idiopathic pulmonary fibrosis (follows with Dr. [name] at [facility]), hypertension, diverticulosis and recent identification of a rectosigmoid mass presenting with abdominal pain and constipation.    Of note,  CT 03/20/2022 status post flexible sigmoidoscopy performed that same day by Dr. [name] with initial biopsy revealing no dysplasia or malignancy.  Patient had a follow-up MRI 12/21 revealing severe sigmoid diverticulosis with concentric wall thickening involving the distal sigmoid colon and upper rectum and adjacent 3.6 cm multiloculated fluid collection favoring diverticulitis and diverticular abscess (but not read until 12/26).  Upon eventual presentation to [facility] emergency department on 12/23 patient reported not having a bowel movement for 7 days.  At that time, MRI results were not yet available and patient was hospitalized for severe constipation.  Hospitalist group was called and patient was admitted to the hospital.  Dr. [name] and Dr. [name] with general surgery was consulted.  CT imaging of the abdomen pelvis performed on 12/24 in addition to 12/21 MRI seems consistent with peridiverticular abscess formation.  Patient was placed on intravenous antibiotic therapy at that time.  Interventional radiology was consulted but unfortunately drainage could not be performed.  Patient eventually underwent rectosigmoid resection with colostomy performed by Dr. [name] on 03/31/2022 with continued antibiotics afterwards.  On 04/11/2022 patient began to develop recurrence of fever and worsening abdominal pain with rectal bleeding.  Repeat CT imaging  consistent with new abscess formation measuring 8.9 x 8.1 x 6.7 cm with concern for leakage from the stump.  On 04/12/2022 patient underwent IR guided drainage of the intra-abdominal abscess.  Cultures from 1/7 abscess drainage growing out Enterococcus faecalis.  Hospitalization is also been complicated by postoperative ileus with requiring temporary use of TPN.  This has since been discontinued on 1/13.  Patient also received a total of 2 units of transfused packed red blood cells throughout the hospitalization for recurrent anemia with last transfusion being on 1/8.       Assessment and Plan: * Diverticulitis of large intestine with perforation and abscess CT imaging of abdomen and pelvis performed 12/24 as well as 12/21 MRI consistent with peridiverticular abscess formation Patient status post rectosigmoid resection with colostomy performed by Dr. [name] on 12/26 Patient additionally developed new abscess formation in the pelvis identified on repeat CT imaging 1/6 status post IR guided drainage placement 1/7. Repeat CT imaging of abdomen and pelvis performed today revealing improved but still persisting abscess of the posterior pelvis measuring 5.7 x 5.1 x 4.2 cm. General surgery, interventional radiology, infectious disease are following daily, their input is appreciated.   Cultures from 1/7 growing Enterococcus faecalis Continue to treat with intravenous Unasyn for now with likely need for several weeks of oral antibiotic therapy after intravenous antibiotics are complete.   Based on continued clinical improvement anticipate discharge in the next few days.  Pelvic abscess in female Please see assessment and plan above  Ileus following gastrointestinal surgery ([place]) Prolonged postoperative ileus and inability to take p.o. intake prompting initiation of TPN TPN has since been weaned off on 1/13. Patient has been placed back on a soft diet however taking in adequate oral intake continues to be  a challenge due to poor  appetite. Encouraging patient to continue to increase oral intake and encouraging family to bring appropriate foods for patient to eat that she enjoys. Monitoring electrolytes closely and replacing as necessary  Toxic metabolic encephalopathy Prolonged course of multifactorial delirium thought to be secondary to prolonged hospitalization, infection and polypharmacy This has since resolved  Acute blood loss anemia 2 units transfused on 1/8 Patient continues to have intermittent small amounts of rectal bleeding serosanguineous drainage in the patient's percutaneous drain Patient additionally exhibiting very small amounts of intermittent rectal bleeding but surgery is tracking and feels will dissipate and stop on its own. Hemoglobin and hematocrit remain stable. Monitoring hemoglobin and hematocrit with serial CBCs Will administer dose of ferric gluconate intravenously today.  Chronic diastolic CHF (congestive heart failure) (HCC) No clinical evidence of cardiogenic volume overload Strict input and output monitoring Daily weights    Idiopathic pulmonary fibrosis (HCC) Longstanding known history, follows with Dr. [name] at [place] [facility] No active symptoms Outpatient follow up  Essential hypertension On low dose lisinopril at 10 mg daily Somewhat relaxed blood pressure targets due to patient's advanced age. As needed intravenous antihypertensives for markedly elevated blood pressure.  Rectal bleeding Intermittently occurring but improving Hemoglobin and hematocrit stable  Hypothyroidism Resume home regimen of Synthroid   Hypokalemia Replacing with potassium chloride Evaluating for concurrent hypomagnesemia  Monitoring potassium levels with serial chemistries.   Chronic venous insufficiency Stable. NO leg swelling or lesions  Allergic reaction caused by a drug-resolved as of 04/17/2022 Patient reports severe itching after pain  medication: MS vs dilaudid (both given in ED. )  Plan Benadryl 25 mg q6 prn  H2 blocker bid prn  Avoid dilaudid, MS        Subjective:  Patient complaining of continued abdominal pain although this has been improving daily.  Pain is mild in intensity, worse with movement and improved with rest.  Patient continues to complain of associated poor appetite.  Physical Exam:  Vitals:   04/19/22 1322 04/19/22 2059 04/20/22 0434 04/20/22 1337  BP: (!) 141/66 (!) 166/70 (!) 145/67 (!) 156/62  Pulse: 71 72 66 67  Resp: '16 16 16   '$ Temp: 98 F (36.7 C) 98.3 F (36.8 C) 98.6 F (37 C) 98.2 F (36.8 C)  TempSrc: Oral Oral Oral Oral  SpO2: 97% 97% 94% 97%  Weight:      Height:        Constitutional: Awake alert and oriented x3, no associated distress.   Skin: no rashes, no lesions, good skin turgor noted. Eyes: Pupils are equally reactive to light.  No evidence of scleral icterus or conjunctival pallor.  ENMT: Moist mucous membranes noted.  Posterior pharynx clear of any exudate or lesions.   Respiratory: Notable bibasilar rales without wheezing.  Normal respiratory effort. No accessory muscle use.  Cardiovascular: Regular rate and rhythm, no murmurs / rubs / gallops.  Distal bilateral lower extremity pitting edema.  2+ pedal pulses. No carotid bruits.  Abdomen: Ostomy in place with pink mucosa and dark brown stool.  Abdomen is diffusely tender but soft.   Positive bowel sounds noted in all quadrants.   Musculoskeletal: No joint deformity upper and lower extremities. Good ROM, no contractures. Normal muscle tone.    Data Reviewed:  I have personally reviewed and interpreted labs, imaging.  Significant findings are   CBC: Recent Labs  Lab 04/16/22 0333 04/17/22 0342 04/18/22 0355 04/19/22 0303 04/20/22 0252  WBC 7.1 7.2 7.3 6.3 5.4  NEUTROABS 3.3 3.0 3.0 2.6 1.8  HGB 9.3* 9.7* 9.8* 9.5* 9.1*  HCT 29.2* 30.6* 31.8* 30.2* 29.2*  MCV 100.3* 102.7* 101.9* 101.3* 101.4*  PLT  288 303 313 318 254   Basic Metabolic Panel: Recent Labs  Lab 04/16/22 0333 04/17/22 0342 04/18/22 0355 04/19/22 0303 04/20/22 0252  NA 138 135 135 135 133*  K 4.0 4.3 4.0 3.6 3.2*  CL 107 102 101 102 102  CO2 '26 26 26 25 25  '$ GLUCOSE 124* 100* 84 82 85  BUN '12 14 13 10 9  '$ CREATININE 0.56 0.54 0.65 0.59 0.67  CALCIUM 7.9* 8.0* 8.1* 8.0* 7.7*  MG 2.5* 2.1 2.3 2.1 2.0  PHOS 2.7  --   --   --  3.0   GFR: Estimated Creatinine Clearance: 57.5 mL/min (by C-G formula based on SCr of 0.67 mg/dL). Liver Function Tests: Recent Labs  Lab 04/16/22 0333 04/17/22 0342 04/18/22 0355 04/19/22 0303 04/20/22 0252  AST 33 33 33 23 20  ALT '24 28 30 21 18  '$ ALKPHOS 56 60 62 59 53  BILITOT 0.4 0.3 0.4 0.5 0.6  PROT 6.2* 6.2* 6.3* 6.2* 6.0*  ALBUMIN 2.0* 2.1* 2.1* 2.2* 2.1*      Code Status:  Full code.  Code status decision has been confirmed with: patient Family Communication: Daughter is at the bedside and has been updated on plan of care (1/15)   Severity of Illness:  The appropriate patient status for this patient is INPATIENT. Inpatient status is judged to be reasonable and necessary in order to provide the required intensity of service to ensure the patient's safety. The patient's presenting symptoms, physical exam findings, and initial radiographic and laboratory data in the context of their chronic comorbidities is felt to place them at high risk for further clinical deterioration. Furthermore, it is not anticipated that the patient will be medically stable for discharge from the hospital within 2 midnights of admission.   * I certify that at the point of admission it is my clinical judgment that the patient will require inpatient hospital care spanning beyond 2 midnights from the point of admission due to high intensity of service, high risk for further deterioration and high frequency of surveillance required.*  Time spent:  56 minutes  Author:  Vernelle Emerald  MD  04/20/2022 5:44 PM

## 2022-04-20 NOTE — Progress Notes (Signed)
[facility]  Date of Admission:  03/28/2022   Total days of inpatient antibiotics 18  Principal Problem:   Diverticulitis of large intestine with perforation and abscess Active Problems:   Hypothyroidism   Hx of myocardial infarction   Idiopathic pulmonary fibrosis (HCC)   Essential hypertension   Rectal bleeding   Pelvic abscess in female   Acute blood loss anemia   Chronic diastolic CHF (congestive heart failure) (HCC)   Toxic metabolic encephalopathy   Ileus following gastrointestinal surgery (HCC)   Intra-abdominal abscess (HCC)   IDA (iron deficiency anemia)          Assessment: 77 year old female with past medical history of hypothyroidism heart failure preserved ejection fraction, IPF admitted with complicated diverticulitis status post rectosigmoid resection with colostomy on 12/26 Hospital course complicated by persistent worsening intra-abdominal abscess and with associated fever.  #Diverticulitis status post colostomy on 23/53 complicated intra-abdominal abscess - CT abdomen pelvis on 1/6 showed worsening pelvic abscess near right.  About 9 cm - 1/6 blood cultures with no growth - On 1/7 CT-guided drain placement into pelvic abscess.  Left transgluteal approach with cultures growing E faecalis and Bacteroides fragilis and ovatus. - Surgery noted outpatient drain study to rule out fistula stopped leaking about 2 weeks. -Pt has PICC/JP buttock/colostomy Recommendations: -Abdominal CT today to assess size of abscess.  If appreciable reduction in size transition to p.o. Augmentin x 6 weeks from drain placement on discharge.   - Ultimate course of antibiotics based on drain studies to rule our fistula stumple as well.   Microbiology:   Antibiotics: Unasyn 1/10-p Pip-tazo-12/27-1/9 Cultures:  Other 04/12/22 E. faecalis, bacteroides fragilis and ovatus  SUBJECTIVE: Resting bed no new complaints.  Interval:  Afebrile  overnight Review of Systems: Review of Systems  All other systems reviewed and are negative.    Scheduled Meds:  Chlorhexidine Gluconate Cloth  6 each Topical Daily   feeding supplement  1 Container Oral TID BM   feeding supplement  237 mL Oral BID BM   levothyroxine  112 mcg Oral QAC breakfast   lidocaine  1 patch Transdermal Q24H   lisinopril  10 mg Oral Daily   psyllium  1 packet Oral Daily   sodium chloride flush  3 mL Intravenous Q12H   sodium chloride flush  5 mL Per Tube Q8H   Continuous Infusions:  sodium chloride     sodium chloride 10 mL/hr at 04/18/22 1823   ampicillin-sulbactam (UNASYN) IV 3 g (04/20/22 0315)   ondansetron (ZOFRAN) IV     promethazine (PHENERGAN) injection (IM or IVPB) 200 mL/hr at 04/17/22 1727   PRN Meds:.sodium chloride, acetaminophen, diphenhydrAMINE, fentaNYL (SUBLIMAZE) injection, hydrALAZINE, LORazepam, metoCLOPramide (REGLAN) injection, ondansetron (ZOFRAN) IV **OR** ondansetron (ZOFRAN) IV, phenol, polyethylene glycol, polyvinyl alcohol, promethazine (PHENERGAN) injection (IM or IVPB), sodium chloride flush, sodium chloride flush, traMADol Allergies  Allergen Reactions   Prednisone Other (See Comments)    Changed personality    Dilaudid [Hydromorphone] Itching and Other (See Comments)    Dilaudid caused marked confusion   Atrovent Hfa [Ipratropium Bromide Hfa] Itching and Other (See Comments)    Pt could not sleep   Cheratussin Ac [Guaifenesin-Codeine] Other (See Comments)    Headaches    Diltiazem Itching and Other (See Comments)    Makes patient sick. Shakes. GI   Hydrocodone Itching   Influenza Vaccines Other (See Comments)    Pt reports heart attack after last flu shot  Morphine Itching   Motrin [Ibuprofen] Itching and Other (See Comments)    "Gives false reading in blood"   Oxycodone Itching   Codeine Itching, Nausea Only and Other (See Comments)    Pt takes promethazine with codeine at home    OBJECTIVE: Vitals:    04/19/22 0518 04/19/22 1322 04/19/22 2059 04/20/22 0434  BP: (!) 151/69 (!) 141/66 (!) 166/70 (!) 145/67  Pulse: 70 71 72 66  Resp: '16 16 16 16  '$ Temp: 98.4 F (36.9 C) 98 F (36.7 C) 98.3 F (36.8 C) 98.6 F (37 C)  TempSrc: Oral Oral Oral Oral  SpO2: 96% 97% 97% 94%  Weight:      Height:       Body mass index is 27.46 kg/m.  Physical Exam Constitutional:      Appearance: Normal appearance.  HENT:     Head: Normocephalic and atraumatic.     Right Ear: Tympanic membrane normal.     Left Ear: Tympanic membrane normal.     Nose: Nose normal.     Mouth/Throat:     Mouth: Mucous membranes are moist.  Eyes:     Extraocular Movements: Extraocular movements intact.     Conjunctiva/sclera: Conjunctivae normal.     Pupils: Pupils are equal, round, and reactive to light.  Cardiovascular:     Rate and Rhythm: Normal rate and regular rhythm.     Heart sounds: No murmur heard.    No friction rub. No gallop.  Pulmonary:     Effort: Pulmonary effort is normal.     Breath sounds: Normal breath sounds.  Abdominal:     General: Abdomen is flat.     Palpations: Abdomen is soft.  Musculoskeletal:        General: Normal range of motion.  Skin:    General: Skin is warm and dry.  Neurological:     General: No focal deficit present.     Mental Status: She is alert and oriented to person, place, and time.  Psychiatric:        Mood and Affect: Mood normal.       Lab Results Lab Results  Component Value Date   WBC 5.4 04/20/2022   HGB 9.1 (L) 04/20/2022   HCT 29.2 (L) 04/20/2022   MCV 101.4 (H) 04/20/2022   PLT 330 04/20/2022    Lab Results  Component Value Date   CREATININE 0.67 04/20/2022   BUN 9 04/20/2022   NA 133 (L) 04/20/2022   K 3.2 (L) 04/20/2022   CL 102 04/20/2022   CO2 25 04/20/2022    Lab Results  Component Value Date   ALT 18 04/20/2022   AST 20 04/20/2022   ALKPHOS 53 04/20/2022   BILITOT 0.6 04/20/2022        Laurice Record, [place]  [facility] 04/20/2022, 4:47 AM

## 2022-04-20 NOTE — Progress Notes (Signed)
Mobility Specialist - Progress Note   04/20/22 0959  Mobility  Activity Ambulated with assistance to bathroom;Ambulated with assistance in hallway  Level of Assistance Contact guard assist, steadying assist  Assistive Device Other (Comment) (rollator)  Distance Ambulated (ft) 500 ft  Range of Motion/Exercises Active  Activity Response Tolerated well  Mobility Referral Yes  $Mobility charge 1 Mobility   Pt was found in bed and agreeable to ambulate after bathroom. Had x2 LOB during session that needed contact assist. Had x3 seated rest breaks for <25mn. At EOS returned to recliner chair with all necessities in reach.   BFerd HibbsMobility Specialist

## 2022-04-20 NOTE — Progress Notes (Signed)
Referring Physician(s): Toth,P  Supervising Physician: Aletta Edouard  Patient Status:  [facility] - In-pt  Chief Complaint: Pelvic /buttocks pain/abscess   Subjective: Pt without new c/o   Allergies: Prednisone, Dilaudid [hydromorphone], Atrovent hfa [ipratropium bromide hfa], Cheratussin ac [guaifenesin-codeine], Diltiazem, Hydrocodone, Influenza vaccines, Morphine, Motrin [ibuprofen], Oxycodone, and Codeine  Medications: Prior to Admission medications   Medication Sig Start Date End Date Taking? Authorizing Provider  aspirin EC 81 MG tablet Take 81 mg by mouth daily. Swallow whole.   Yes [provider]  benzonatate (TESSALON) 100 MG capsule Take 200 mg by mouth as needed for cough. 03/06/22  Yes [provider]  furosemide (LASIX) 40 MG tablet TAKE 1 TABLET BY MOUTH TWICE A DAY Patient taking differently: Take 40 mg by mouth 2 (two) times daily. 09/03/21  Yes Lowne Chase, Yvonne R, DO  KLOR-CON M20 20 MEQ tablet Take 1 tablet (20 mEq total) by mouth 2 (two) times daily. Will take 3 a daily only as needed. Patient taking differently: Take 20 mEq by mouth 2 (two) times daily. 01/06/22  Yes [name], DO  linaclotide (LINZESS) 145 MCG CAPS capsule Take 1 capsule (145 mcg total) by mouth daily before breakfast. 03/17/22  Yes Sharyn Creamer, MD  Nintedanib (OFEV) 100 MG CAPS Take 100 mg by mouth in the morning and at bedtime. 07/03/20  Yes [provider]  ondansetron (ZOFRAN-ODT) 4 MG disintegrating tablet TAKE 1 TABLET BY MOUTH EVERY 8 HOURS AS NEEDED FOR NAUSEA AND VOMITING Patient taking differently: Take 4 mg by mouth as needed for nausea or vomiting. 07/18/21  Yes [name], DO  SYNTHROID 112 MCG tablet Take 1 tablet (112 mcg total) by mouth daily before breakfast. 03/02/22  Yes Roma Schanz R, DO  vitamin C (ASCORBIC ACID) 500 MG tablet Take 500 mg by mouth daily.   Yes [provider]  AMBULATORY NON FORMULARY  MEDICATION Medication Name: Diltiazem 2% with Lidocaine 5%- use index finger, apply small amount of medication inside the rectum up to your first knuckle/joint for 8 weeks Patient not taking: Reported on 03/29/2022 03/20/22   Sharyn Creamer, MD     Vital Signs: BP (!) 156/62   Pulse 67   Temp 98.2 F (36.8 C) (Oral)   Resp 16   Ht '5\' 4"'$  (1.626 m)   Wt 160 lb (72.6 kg)   SpO2 97%   BMI 27.46 kg/m   Physical Exam awake/alert; TG drain intact, insertion site ok, mildly tender, output 60 cc yesterday, 35 cc today purulent bloody fluid;, drain flushed without difficulty  Imaging: CT ABDOMEN PELVIS W CONTRAST  Result Date: 04/20/2022 CLINICAL DATA:  Intra-abdominal abscess, status post pelvic percutaneous drain placement EXAM: CT ABDOMEN AND PELVIS WITH CONTRAST TECHNIQUE: Multidetector CT imaging of the abdomen and pelvis was performed using the standard protocol following bolus administration of intravenous contrast. RADIATION DOSE REDUCTION: This exam was performed according to the departmental dose-optimization program which includes automated exposure control, adjustment of the mA and/or kV according to patient size and/or use of iterative reconstruction technique. CONTRAST:  160m OMNIPAQUE IOHEXOL 300 MG/ML  SOLN COMPARISON:  04/11/2022 FINDINGS: Lower chest: Small left, trace right pleural effusions and associated atelectasis or consolidation, similar to prior examination. 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 Hepatobiliary: No solid liver abnormality is seen. No gallstones, gallbladder wall thickening, or biliary dilatation. Pancreas: Unremarkable. No pancreatic ductal dilatation or surrounding inflammatory changes. Spleen: Normal in size without significant abnormality.  Adrenals/Urinary Tract: Adrenal glands are unremarkable. Kidneys are normal, without renal calculi, solid lesion, or hydronephrosis. Bladder is unremarkable. Stomach/Bowel: Stomach is within normal limits. Appendix  appears normal. 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 Vascular/Lymphatic: Aortic atherosclerosis. No enlarged abdominal or pelvic lymph nodes. Reproductive: Status post hysterectomy. Other: Anasarca, somewhat improved compared to prior examination. Left lower quadrant end colostomy. Interval placement of a left posterior approach percutaneous pigtail drainage catheter within a rim enhancing air and fluid collection in the posterior pelvis adjacent to the rectal stump, now measuring 5.7 x 5.1 x 4.2 cm, previously 8.9 x 8.1 x 6.7 cm when measured similarly (series 2, image 63, series 7, image 58). Musculoskeletal: No acute or significant osseous findings. IMPRESSION: 1. Interval placement of a left posterior approach percutaneous pigtail drainage catheter within a rim enhancing air and fluid collection in the posterior pelvis adjacent to the rectal stump, now measuring 5.7 x 5.1 x 4.2 cm, previously 8.9 x 8.1 x 6.7 cm when measured similarly. 2. 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 3. Small left, trace right pleural effusions and associated atelectasis or consolidation, similar to prior examination. 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 Aortic Atherosclerosis (ICD10-I70.0). Electronically Signed   By: Delanna Ahmadi M.D.   On: 04/20/2022 13:28    Labs:  CBC: Recent Labs    04/17/22 0342 04/18/22 0355 04/19/22 0303 04/20/22 0252  WBC 7.2 7.3 6.3 5.4  HGB 9.7* 9.8* 9.5* 9.1*  HCT 30.6* 31.8* 30.2* 29.2*  PLT 303 313 318 330    COAGS: Recent Labs    03/30/22 0129  INR 1.2    BMP: Recent Labs    04/17/22 0342 04/18/22 0355 04/19/22 0303 04/20/22 0252  NA 135 135 135 133*  K 4.3 4.0 3.6 3.2*  CL 102 101 102 102  CO2 '26 26 25 25  '$ GLUCOSE 100* 84 82 85  BUN '14 13 10 9  '$ CALCIUM 8.0* 8.1* 8.0* 7.7*  CREATININE 0.54 0.65 0.59 0.67  GFRNONAA >60 >60 >60 >60    LIVER FUNCTION TESTS: Recent Labs     04/17/22 0342 04/18/22 0355 04/19/22 0303 04/20/22 0252  BILITOT 0.3 0.4 0.5 0.6  AST 33 33 23 20  ALT '28 30 21 18  '$ ALKPHOS 60 62 59 53  PROT 6.2* 6.3* 6.2* 6.0*  ALBUMIN 2.1* 2.1* 2.2* 2.1*    Assessment and Plan: POD #20 s/p robotic lower anterior rectosigmoid resection with colostomy and drainage of pelvic abscess by Dr. 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 Interval placement of a left posterior approach percutaneous pigtail drainage catheter within a rim enhancing air and fluid collection in the posterior pelvis adjacent to the rectal stump, now measuring 5.7 x 5.1 x 4.2 cm, previously 8.9 x 8.1 x 6.7 cm when measured similarly. 2. 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 3. Small left, trace right pleural effusions and associated atelectasis or consolidation, similar to prior examination. Fibrosis of the included bilateral lung bases  Images were reviewed by Dr. Kathlene Cote- rec converting JP  to gravity bag as fistula a concern; cont drain flushes ; as OP rec once daily flush of drain with 5 cc sterile saline, output recording and dressing change every 2-3 days; once definite dc date set will set up for IR drain clinic f/u incl drain injection  Electronically Signed: D. Rowe Robert, PA-C 04/20/2022, 4:58 PM   I spent a total of 15 Minutes at the the patient's bedside AND on the patient's hospital floor or unit, greater than 50% of which was counseling/coordinating care for pelvic abscess drain    Patient ID: Alexandria [name], female   DOB: [birth year], 77 y.o.    MRN: [id number]

## 2022-04-20 NOTE — Assessment & Plan Note (Signed)
· 

## 2022-04-20 NOTE — Progress Notes (Addendum)
Progress Note  20 Days Post-Op  Subjective: No new complaints.  Tolerating solid diet.  Colostomy working well and she emptied it twice yesterday.  Ambulated 4 times yesterday and sat in chair for 2 hours.  Objective: Vital signs in last 24 hours: Temp:  [98 F (36.7 C)-98.6 F (37 C)] 98.6 F (37 C) (01/15 0434) Pulse Rate:  [66-72] 66 (01/15 0434) Resp:  [16] 16 (01/15 0434) BP: (141-166)/(66-70) 145/67 (01/15 0434) SpO2:  [94 %-97 %] 94 % (01/15 0434) Last BM Date : 04/20/22  Intake/Output from previous day: 01/14 0701 - 01/15 0700 In: 1579.8 [P.O.:1020; I.V.:104.8; IV Piggyback:440] Out: 220 [Drains:60; Stool:160] Intake/Output this shift: Total I/O In: -  Out: 10 [Drains:10]  PE: General: WD, chronically ill appearing female who is laying in bed in NAD Heart: regular, rate, and rhythm.  Lungs:  Respiratory effort nonlabored Abd: soft, appropriately ttp, ND, +BS, stoma viable with gas and small amount stool in bag, incisions C/D/I, drain with bloody, slightly cloudy fluid    Lab Results:  Recent Labs    04/19/22 0303 04/20/22 0252  WBC 6.3 5.4  HGB 9.5* 9.1*  HCT 30.2* 29.2*  PLT 318 330   BMET Recent Labs    04/19/22 0303 04/20/22 0252  NA 135 133*  K 3.6 3.2*  CL 102 102  CO2 25 25  GLUCOSE 82 85  BUN 10 9  CREATININE 0.59 0.67  CALCIUM 8.0* 7.7*   PT/INR No results for input(s): "LABPROT", "INR" in the last 72 hours. CMP     Component Value Date/Time   NA 133 (L) 04/20/2022 0252   NA 140 03/21/2021 1214   K 3.2 (L) 04/20/2022 0252   CL 102 04/20/2022 0252   CO2 25 04/20/2022 0252   GLUCOSE 85 04/20/2022 0252   BUN 9 04/20/2022 0252   BUN 21 03/21/2021 1214   CREATININE 0.67 04/20/2022 0252   CREATININE 0.74 03/13/2022 1511   CALCIUM 7.7 (L) 04/20/2022 0252   PROT 6.0 (L) 04/20/2022 0252   ALBUMIN 2.1 (L) 04/20/2022 0252   AST 20 04/20/2022 0252   ALT 18 04/20/2022 0252   ALKPHOS 53 04/20/2022 0252   BILITOT 0.6 04/20/2022 0252    GFRNONAA >60 04/20/2022 0252   GFRAA 85 05/24/2020 1006   Lipase     Component Value Date/Time   LIPASE 25 03/28/2022 1904       Studies/Results: No results found.  Anti-infectives: Anti-infectives (From admission, onward)    Start     Dose/Rate Route Frequency Ordered Stop   04/18/22 1000  Ampicillin-Sulbactam (UNASYN) 3 g in sodium chloride 0.9 % 100 mL IVPB        3 g 200 mL/hr over 30 Minutes Intravenous Every 6 hours 04/18/22 0901 04/21/22 0959   04/15/22 1200  ampicillin (OMNIPEN) 1 g in sodium chloride 0.9 % 100 mL IVPB  Status:  Discontinued        1 g 300 mL/hr over 20 Minutes Intravenous Every 6 hours 04/15/22 1044 04/15/22 1053   04/15/22 1200  Ampicillin-Sulbactam (UNASYN) 3 g in sodium chloride 0.9 % 100 mL IVPB        3 g 200 mL/hr over 30 Minutes Intravenous Every 6 hours 04/15/22 1053 04/18/22 0535   04/11/22 1400  piperacillin-tazobactam (ZOSYN) IVPB 3.375 g  Status:  Discontinued        3.375 g 12.5 mL/hr over 240 Minutes Intravenous Every 8 hours 04/11/22 1143 04/15/22 1044   04/05/22 1600  piperacillin-tazobactam (ZOSYN)  IVPB 3.375 g        3.375 g 12.5 mL/hr over 240 Minutes Intravenous Every 8 hours 04/05/22 1222 04/10/22 1230   03/31/22 1045  cefoTEtan (CEFOTAN) 2 g in sodium chloride 0.9 % 100 mL IVPB        2 g 200 mL/hr over 30 Minutes Intravenous On call to O.R. 03/31/22 0959 03/31/22 1225   03/29/22 0800  piperacillin-tazobactam (ZOSYN) IVPB 3.375 g  Status:  Discontinued        3.375 g 12.5 mL/hr over 240 Minutes Intravenous Every 8 hours 03/29/22 0746 04/05/22 0826        Assessment/Plan  POD#20 s/p robotic lower anterior rectosigmoid resection with colostomy and drainage of pelvic abscess by Dr. 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 [name] cont abx through 1/17 and may switch to oral augmentin to complete.  Source control obtained. -drain with some thicker bloody/cloudy output. ? Rectal stump leak.  Drain should remain in place at time of discharge. - soft diet and miralax, TNA off - hgb 9.7 s/p 2 units prbc 1/8, stable  - mobilize, pulm toilet - WOC following for new ostomy - overall doing well with it -repeat CT today per ID.  Patient otherwise is surgically stable for DC home.  Follow up sent to office but office closed today, so they can call patient with information    FEN: HH diet  ID: zosyn 12/31>1/5; Zosyn 1/6>1/10; unasyn 1/10>>1/17 VTE: LMWH   - below per TRH -  Chronic diastolic HF Hypothyroidism Pulmonary fibrosis HTN    LOS: 22 days    Alexandria [name], [facility] 04/20/2022, 9:28 AM Please see Amion for pager number during day hours 7:00am-4:30pm

## 2022-04-21 ENCOUNTER — Other Ambulatory Visit (HOSPITAL_COMMUNITY): Payer: Self-pay

## 2022-04-21 ENCOUNTER — Other Ambulatory Visit: Payer: Self-pay | Admitting: Radiology

## 2022-04-21 DIAGNOSIS — E876 Hypokalemia: Secondary | ICD-10-CM

## 2022-04-21 LAB — CBC WITH DIFFERENTIAL/PLATELET
Abs Immature Granulocytes: 0.09 10*3/uL — ABNORMAL HIGH (ref 0.00–0.07)
Basophils Absolute: 0.1 10*3/uL (ref 0.0–0.1)
Basophils Relative: 2 %
Eosinophils Absolute: 0.6 10*3/uL — ABNORMAL HIGH (ref 0.0–0.5)
Eosinophils Relative: 11 %
HCT: 31 % — ABNORMAL LOW (ref 36.0–46.0)
Hemoglobin: 9.7 g/dL — ABNORMAL LOW (ref 12.0–15.0)
Immature Granulocytes: 2 %
Lymphocytes Relative: 26 %
Lymphs Abs: 1.3 10*3/uL (ref 0.7–4.0)
MCH: 31.7 pg (ref 26.0–34.0)
MCHC: 31.3 g/dL (ref 30.0–36.0)
MCV: 101.3 fL — ABNORMAL HIGH (ref 80.0–100.0)
Monocytes Absolute: 1.3 10*3/uL — ABNORMAL HIGH (ref 0.1–1.0)
Monocytes Relative: 26 %
Neutro Abs: 1.7 10*3/uL (ref 1.7–7.7)
Neutrophils Relative %: 33 %
Platelets: 347 10*3/uL (ref 150–400)
RBC: 3.06 MIL/uL — ABNORMAL LOW (ref 3.87–5.11)
RDW: 15.8 % — ABNORMAL HIGH (ref 11.5–15.5)
WBC: 5 10*3/uL (ref 4.0–10.5)
nRBC: 0 % (ref 0.0–0.2)

## 2022-04-21 LAB — COMPREHENSIVE METABOLIC PANEL
ALT: 17 U/L (ref 0–44)
AST: 20 U/L (ref 15–41)
Albumin: 2.2 g/dL — ABNORMAL LOW (ref 3.5–5.0)
Alkaline Phosphatase: 58 U/L (ref 38–126)
Anion gap: 10 (ref 5–15)
BUN: 6 mg/dL — ABNORMAL LOW (ref 8–23)
CO2: 24 mmol/L (ref 22–32)
Calcium: 7.9 mg/dL — ABNORMAL LOW (ref 8.9–10.3)
Chloride: 102 mmol/L (ref 98–111)
Creatinine, Ser: 0.58 mg/dL (ref 0.44–1.00)
GFR, Estimated: >60 mL/min (ref >60)
Glucose, Bld: 77 mg/dL (ref 70–99)
Potassium: 3.5 mmol/L (ref 3.5–5.1)
Sodium: 136 mmol/L (ref 135–145)
Total Bilirubin: 0.5 mg/dL (ref 0.3–1.2)
Total Protein: 6.4 g/dL — ABNORMAL LOW (ref 6.5–8.1)

## 2022-04-21 LAB — MAGNESIUM: Magnesium: 2.1 mg/dL (ref 1.7–2.4)

## 2022-04-21 LAB — PHOSPHORUS: Phosphorus: 2.4 mg/dL — ABNORMAL LOW (ref 2.5–4.6)

## 2022-04-21 MED ORDER — AMOXICILLIN-POT CLAVULANATE 875-125 MG PO TABS: 1.0000 | ORAL_TABLET | Freq: Two times a day (BID) | ORAL | 0 refills | Status: AC

## 2022-04-21 MED ORDER — NORMAL SALINE FLUSH 0.9 % IV SOLN
10.0000 mL | Freq: Every day | INTRAVENOUS | 0 refills | Status: DC
  Filled 2022-04-21: qty 300, 30d supply, fill #0

## 2022-04-21 MED ORDER — AMOXICILLIN-POT CLAVULANATE 875-125 MG PO TABS
1.0000 | ORAL_TABLET | Freq: Two times a day (BID) | ORAL | Status: DC
  Administered 2022-04-21: 1 via ORAL
  Filled 2022-04-21: qty 1

## 2022-04-21 MED ORDER — TRAMADOL HCL 50 MG PO TABS: 100.0000 mg | ORAL_TABLET | Freq: Four times a day (QID) | ORAL | 0 refills | Status: DC | PRN

## 2022-04-21 MED ORDER — LISINOPRIL 20 MG PO TABS
20.0000 mg | ORAL_TABLET | Freq: Every day | ORAL | Status: DC
  Administered 2022-04-21: 20 mg via ORAL
  Filled 2022-04-21: qty 1

## 2022-04-21 MED ORDER — FUROSEMIDE 40 MG PO TABS: 40.0000 mg | ORAL_TABLET | Freq: Every day | ORAL | 1 refills | Status: DC | PRN

## 2022-04-21 MED ORDER — POLYETHYLENE GLYCOL 3350 17 G PO PACK: 17.0000 g | PACK | Freq: Every day | ORAL | 0 refills | Status: DC | PRN

## 2022-04-21 MED ORDER — LISINOPRIL 20 MG PO TABS: 20.0000 mg | ORAL_TABLET | Freq: Every day | ORAL | Status: DC

## 2022-04-21 MED ORDER — PSYLLIUM 95 % PO PACK: 1.0000 | PACK | Freq: Every day | ORAL | Status: DC

## 2022-04-21 MED ORDER — LISINOPRIL 20 MG PO TABS: 20.0000 mg | ORAL_TABLET | Freq: Every day | ORAL | 2 refills | Status: DC

## 2022-04-21 MED ORDER — ENSURE ENLIVE PO LIQD: 237.0000 mL | Freq: Two times a day (BID) | ORAL | 12 refills | Status: DC

## 2022-04-21 MED ORDER — TRAMADOL HCL 50 MG PO TABS: 100.0000 mg | ORAL_TABLET | Freq: Four times a day (QID) | ORAL | 0 refills | Status: AC | PRN

## 2022-04-21 MED ORDER — SODIUM CHLORIDE 0.9% FLUSH: 5.0000 mL | Freq: Every day | INTRAVENOUS | 0 refills | Status: DC

## 2022-04-21 MED ORDER — AMOXICILLIN-POT CLAVULANATE 875-125 MG PO TABS: 1.0000 | ORAL_TABLET | Freq: Two times a day (BID) | ORAL | 0 refills | Status: DC

## 2022-04-21 NOTE — Progress Notes (Signed)
Instructed patient's on how to care for drain. Demonstrated dressing change and flush. All questions were answered and daughter stated she feels very comfortable with flushing the drain.

## 2022-04-21 NOTE — Progress Notes (Signed)
[place] for Infectious Disease  Date of Admission:  03/28/2022   Total days of inpatient antibiotics 19  Principal Problem:   Diverticulitis of large intestine with perforation and abscess Active Problems:   Hypothyroidism   Hx of myocardial infarction   Hypokalemia   Idiopathic pulmonary fibrosis (HCC)   Essential hypertension   Rectal bleeding   Pelvic abscess in female   Acute blood loss anemia   Chronic diastolic CHF (congestive heart failure) (HCC)   Toxic metabolic encephalopathy   Ileus following gastrointestinal surgery (HCC)   Intra-abdominal abscess (HCC)   IDA (iron deficiency anemia)          Assessment: 77 year old female with past medical history of hypothyroidism heart failure preserved ejection fraction, IPF admitted with complicated diverticulitis status post rectosigmoid resection with colostomy on 12/26 Hospital course complicated by persistent worsening intra-abdominal abscess and with associated fever.  #Diverticulitis status post colostomy on 09/38 complicated intra-abdominal abscess - CT abdomen pelvis on 1/6 showed worsening pelvic abscess near right.  About 9 cm - 1/6 blood cultures with no growth - On 1/7 CT-guided drain placement into pelvic abscess.  Left transgluteal approach with cultures growing E faecalis and Bacteroides fragilis and ovatus. - Surgery noted outpatient drain study to rule out fistula stopped leaking about 2 weeks. -Pt has PICC/JP buttock/colostomy -CT showed drain R posterior pelvic fluid collection adjacent to rectal stump decrease in size now measuring 5.7 X5.1X 4.2 cm, previously 8.9 X8.1X 6.7 cm. Recommendations: - D/C unasyn - Transition to p.o. Augmentin x 6 weeks from drain placement on discharge  (EOT 2/17). ID F/U on 2/8 with Dr. [name].  - Ultimate course of antibiotics based on drain studies to rule our fistula stumple as well.  ID will sign off Microbiology:   Antibiotics: Unasyn  1/10-p Pip-tazo-12/27-1/9 Cultures:  Other 04/12/22 E. faecalis, bacteroides fragilis and ovatus  SUBJECTIVE: Resting bed no new complaints. Discussed antibiotic plan Interval:  Afebrile overnight Review of Systems: Review of Systems  All other systems reviewed and are negative.    Scheduled Meds:  Chlorhexidine Gluconate Cloth  6 each Topical Daily   feeding supplement  1 Container Oral TID BM   feeding supplement  237 mL Oral BID BM   levothyroxine  112 mcg Oral QAC breakfast   lidocaine  1 patch Transdermal Q24H   lisinopril  10 mg Oral Daily   psyllium  1 packet Oral Daily   sodium chloride flush  3 mL Intravenous Q12H   sodium chloride flush  5 mL Per Tube Q8H   Continuous Infusions:  sodium chloride     sodium chloride 10 mL/hr at 04/18/22 1823   ondansetron (ZOFRAN) IV     promethazine (PHENERGAN) injection (IM or IVPB) 200 mL/hr at 04/17/22 1727   PRN Meds:.sodium chloride, acetaminophen, diphenhydrAMINE, fentaNYL (SUBLIMAZE) injection, hydrALAZINE, LORazepam, metoCLOPramide (REGLAN) injection, ondansetron (ZOFRAN) IV **OR** ondansetron (ZOFRAN) IV, phenol, polyethylene glycol, polyvinyl alcohol, promethazine (PHENERGAN) injection (IM or IVPB), sodium chloride flush, sodium chloride flush, traMADol Allergies  Allergen Reactions   Prednisone Other (See Comments)    Changed personality    Dilaudid [Hydromorphone] Itching and Other (See Comments)    Dilaudid caused marked confusion   Atrovent Hfa [Ipratropium Bromide Hfa] Itching and Other (See Comments)    Pt could not sleep   Cheratussin Ac [Guaifenesin-Codeine] Other (See Comments)    Headaches    Diltiazem Itching and Other (See Comments)    Makes patient sick. Shakes.  GI   Hydrocodone Itching   Influenza Vaccines Other (See Comments)    Pt reports heart attack after last flu shot   Morphine Itching   Motrin [Ibuprofen] Itching and Other (See Comments)    "Gives false reading in blood"   Oxycodone Itching    Codeine Itching, Nausea Only and Other (See Comments)    Pt takes promethazine with codeine at home    OBJECTIVE: Vitals:   04/20/22 0434 04/20/22 1337 04/20/22 2208 04/21/22 0520  BP: (!) 145/67 (!) 156/62 (!) 166/74 (!) 152/69  Pulse: 66 67 69 70  Resp: '16  14 16  '$ Temp: 98.6 F (37 C) 98.2 F (36.8 C) 97.9 F (36.6 C) 98.1 F (36.7 C)  TempSrc: Oral Oral Oral Oral  SpO2: 94% 97% 97% 93%  Weight:      Height:       Body mass index is 27.46 kg/m.  Physical Exam Constitutional:      Appearance: Normal appearance.  HENT:     Head: Normocephalic and atraumatic.     Right Ear: Tympanic membrane normal.     Left Ear: Tympanic membrane normal.     Nose: Nose normal.     Mouth/Throat:     Mouth: Mucous membranes are moist.  Eyes:     Extraocular Movements: Extraocular movements intact.     Conjunctiva/sclera: Conjunctivae normal.     Pupils: Pupils are equal, round, and reactive to light.  Cardiovascular:     Rate and Rhythm: Normal rate and regular rhythm.     Heart sounds: No murmur heard.    No friction rub. No gallop.  Pulmonary:     Effort: Pulmonary effort is normal.     Breath sounds: Normal breath sounds.  Abdominal:     General: Abdomen is flat.     Palpations: Abdomen is soft.  Musculoskeletal:        General: Normal range of motion.  Skin:    General: Skin is warm and dry.  Neurological:     General: No focal deficit present.     Mental Status: She is alert and oriented to person, place, and time.  Psychiatric:        Mood and Affect: Mood normal.       Lab Results Lab Results  Component Value Date   WBC 5.0 04/21/2022   HGB 9.7 (L) 04/21/2022   HCT 31.0 (L) 04/21/2022   MCV 101.3 (H) 04/21/2022   PLT 347 04/21/2022    Lab Results  Component Value Date   CREATININE 0.58 04/21/2022   BUN 6 (L) 04/21/2022   NA 136 04/21/2022   K 3.5 04/21/2022   CL 102 04/21/2022   CO2 24 04/21/2022    Lab Results  Component Value Date   ALT 17  04/21/2022   AST 20 04/21/2022   ALKPHOS 58 04/21/2022   BILITOT 0.5 04/21/2022        Laurice Record, [place] for Infectious Disease [facility] 04/21/2022, 6:00 AM

## 2022-04-21 NOTE — TOC Transition Note (Signed)
Transition of Care [facility]) - CM/SW Discharge Note   Patient Details  Name: Alexandria [name] MRN: [id number] Date of Birth: 1945-11-04  [facility]) CM/SW Contact:  Leeroy Cha, RN Phone Number: 04/21/2022, 2:46 PM   Clinical Narrative:    Patient dcd to home   Final next level of care: [place] Barriers to Discharge: Barriers Resolved   Patient Goals and CMS Choice      Discharge Placement                         Discharge Plan and Services Additional resources added to the After Visit Summary for   In-house Referral: Clinical Social Work Discharge Planning Services: CM Consult Post Acute Care Choice: Home Health, Durable Medical Equipment          DME Arranged: Walker rolling with seat DME Agency: [employer] Date DME Agency Contacted: 04/17/22 Time DME Agency Contacted: [phone] Representative spoke with at DME Agency: Erasmo Downer HH Arranged: PT, RN, OT [facility] Agency: [place] Date [place]: 04/17/22 Time [place]: [phone] Representative spoke with at [place]: [place] ([place]) Interventions [place]: No Food Insecurity (03/31/2022)  Housing: Low Risk  (03/31/2022)  Transportation Needs: No Transportation Needs (03/31/2022)  Utilities: Not At Risk (03/31/2022)  Depression (PHQ2-9): Low Risk  (02/10/2022)  Tobacco Use: Low Risk  (04/20/2022)     Readmission Risk Interventions   Row Labels 04/08/2022    2:29 PM 04/03/2022    8:29 AM  Readmission Risk Prevention Plan   Section Header. No data exists in this row.    Post Dischage Appt    Complete  Medication Screening    Complete  Transportation Screening   Complete Complete  PCP or Specialist Appt within 5-7 Days   Complete   Home Care Screening   Complete   Medication Review (RN CM)   Complete

## 2022-04-21 NOTE — Plan of Care (Signed)
Problem: Education: Goal: Knowledge of General Education information will improve Description: Including pain rating scale, medication(s)/side effects and non-pharmacologic comfort measures 04/21/2022 1538 by Charlyne Petrin, RN Outcome: Adequate for Discharge 04/21/2022 1537 by Charlyne Petrin, RN Outcome: Adequate for Discharge   Problem: Health Behavior/Discharge Planning: Goal: Ability to manage health-related needs will improve 04/21/2022 1538 by Charlyne Petrin, RN Outcome: Adequate for Discharge 04/21/2022 1537 by Charlyne Petrin, RN Outcome: Adequate for Discharge   Problem: Clinical Measurements: Goal: Ability to maintain clinical measurements within normal limits will improve 04/21/2022 1538 by Charlyne Petrin, RN Outcome: Adequate for Discharge 04/21/2022 1537 by Charlyne Petrin, RN Outcome: Adequate for Discharge Goal: Will remain free from infection 04/21/2022 1538 by Charlyne Petrin, RN Outcome: Adequate for Discharge 04/21/2022 1537 by Charlyne Petrin, RN Outcome: Adequate for Discharge Goal: Diagnostic test results will improve 04/21/2022 1538 by Charlyne Petrin, RN Outcome: Adequate for Discharge 04/21/2022 1537 by Charlyne Petrin, RN Outcome: Adequate for Discharge Goal: Respiratory complications will improve 04/21/2022 1538 by Charlyne Petrin, RN Outcome: Adequate for Discharge 04/21/2022 1537 by Charlyne Petrin, RN Outcome: Adequate for Discharge Goal: Cardiovascular complication will be avoided 04/21/2022 1538 by Charlyne Petrin, RN Outcome: Adequate for Discharge 04/21/2022 1537 by Charlyne Petrin, RN Outcome: Adequate for Discharge   Problem: Activity: Goal: Risk for activity intolerance will decrease 04/21/2022 1538 by Charlyne Petrin, RN Outcome: Adequate for Discharge 04/21/2022 1537 by Charlyne Petrin, RN Outcome: Adequate for Discharge   Problem: Nutrition: Goal: Adequate nutrition will be maintained 04/21/2022 1538 by Charlyne Petrin, RN Outcome: Adequate for Discharge 04/21/2022 1537 by Charlyne Petrin,  RN Outcome: Adequate for Discharge   Problem: Coping: Goal: Level of anxiety will decrease 04/21/2022 1538 by Charlyne Petrin, RN Outcome: Adequate for Discharge 04/21/2022 1537 by Charlyne Petrin, RN Outcome: Adequate for Discharge   Problem: Elimination: Goal: Will not experience complications related to bowel motility 04/21/2022 1538 by Charlyne Petrin, RN Outcome: Adequate for Discharge 04/21/2022 1537 by Charlyne Petrin, RN Outcome: Adequate for Discharge Goal: Will not experience complications related to urinary retention 04/21/2022 1538 by Charlyne Petrin, RN Outcome: Adequate for Discharge 04/21/2022 1537 by Charlyne Petrin, RN Outcome: Adequate for Discharge   Problem: Pain Managment: Goal: General experience of comfort will improve 04/21/2022 1538 by Charlyne Petrin, RN Outcome: Adequate for Discharge 04/21/2022 1537 by Charlyne Petrin, RN Outcome: Adequate for Discharge   Problem: Safety: Goal: Ability to remain free from injury will improve 04/21/2022 1538 by Charlyne Petrin, RN Outcome: Adequate for Discharge 04/21/2022 1537 by Charlyne Petrin, RN Outcome: Adequate for Discharge   Problem: Skin Integrity: Goal: Risk for impaired skin integrity will decrease 04/21/2022 1538 by Charlyne Petrin, RN Outcome: Adequate for Discharge 04/21/2022 1537 by Charlyne Petrin, RN Outcome: Adequate for Discharge   Problem: Education: Goal: Understanding of discharge needs will improve 04/21/2022 1538 by Charlyne Petrin, RN Outcome: Adequate for Discharge 04/21/2022 1537 by Charlyne Petrin, RN Outcome: Adequate for Discharge Goal: Verbalization of understanding of the causes of altered bowel function will improve 04/21/2022 1538 by Charlyne Petrin, RN Outcome: Adequate for Discharge 04/21/2022 1537 by Charlyne Petrin, RN Outcome: Adequate for Discharge   Problem: Activity: Goal: Ability to tolerate increased activity will improve 04/21/2022 1538 by Charlyne Petrin, RN Outcome: Adequate for Discharge 04/21/2022 1537 by Charlyne Petrin, RN Outcome: Adequate for Discharge   Problem: Bowel/Gastric: Goal: Gastrointestinal status for postoperative course will improve 04/21/2022 1538 by Charlyne Petrin, RN Outcome: Adequate for Discharge 04/21/2022 1537 by Charlyne Petrin, RN Outcome: Adequate for Discharge   Problem: Health Behavior/Discharge  Planning: Goal: Identification of community resources to assist with postoperative recovery needs will improve 04/21/2022 1538 by Charlyne Petrin, RN Outcome: Adequate for Discharge 04/21/2022 1537 by Charlyne Petrin, RN Outcome: Adequate for Discharge   Problem: Nutritional: Goal: Will attain and maintain optimal nutritional status will improve 04/21/2022 1538 by Charlyne Petrin, RN Outcome: Adequate for Discharge 04/21/2022 1537 by Charlyne Petrin, RN Outcome: Adequate for Discharge   Problem: Clinical Measurements: Goal: Postoperative complications will be avoided or minimized 04/21/2022 1538 by Charlyne Petrin, RN Outcome: Adequate for Discharge 04/21/2022 1537 by Charlyne Petrin, RN Outcome: Adequate for Discharge   Problem: Respiratory: Goal: Respiratory status will improve 04/21/2022 1538 by Charlyne Petrin, RN Outcome: Adequate for Discharge 04/21/2022 1537 by Charlyne Petrin, RN Outcome: Adequate for Discharge   Problem: Skin Integrity: Goal: Will show signs of wound healing 04/21/2022 1538 by Charlyne Petrin, RN Outcome: Adequate for Discharge 04/21/2022 1537 by Charlyne Petrin, RN Outcome: Adequate for Discharge   Problem: Education: Goal: Ability to describe self-care measures that may prevent or decrease complications (Diabetes Survival Skills Education) will improve 04/21/2022 1538 by Charlyne Petrin, RN Outcome: Adequate for Discharge 04/21/2022 1537 by Charlyne Petrin, RN Outcome: Adequate for Discharge Goal: Individualized Educational Video(s) 04/21/2022 1538 by Charlyne Petrin, RN Outcome: Adequate for Discharge 04/21/2022 1537 by Charlyne Petrin, RN Outcome: Adequate for Discharge   Problem:  Coping: Goal: Ability to adjust to condition or change in health will improve 04/21/2022 1538 by Charlyne Petrin, RN Outcome: Adequate for Discharge 04/21/2022 1537 by Charlyne Petrin, RN Outcome: Adequate for Discharge   Problem: Fluid Volume: Goal: Ability to maintain a balanced intake and output will improve 04/21/2022 1538 by Charlyne Petrin, RN Outcome: Adequate for Discharge 04/21/2022 1537 by Charlyne Petrin, RN Outcome: Adequate for Discharge   Problem: Health Behavior/Discharge Planning: Goal: Ability to identify and utilize available resources and services will improve 04/21/2022 1538 by Charlyne Petrin, RN Outcome: Adequate for Discharge 04/21/2022 1537 by Charlyne Petrin, RN Outcome: Adequate for Discharge Goal: Ability to manage health-related needs will improve 04/21/2022 1538 by Charlyne Petrin, RN Outcome: Adequate for Discharge 04/21/2022 1537 by Charlyne Petrin, RN Outcome: Adequate for Discharge   Problem: Metabolic: Goal: Ability to maintain appropriate glucose levels will improve 04/21/2022 1538 by Charlyne Petrin, RN Outcome: Adequate for Discharge 04/21/2022 1537 by Charlyne Petrin, RN Outcome: Adequate for Discharge   Problem: Nutritional: Goal: Maintenance of adequate nutrition will improve 04/21/2022 1538 by Charlyne Petrin, RN Outcome: Adequate for Discharge 04/21/2022 1537 by Charlyne Petrin, RN Outcome: Adequate for Discharge Goal: Progress toward achieving an optimal weight will improve 04/21/2022 1538 by Charlyne Petrin, RN Outcome: Adequate for Discharge 04/21/2022 1537 by Charlyne Petrin, RN Outcome: Adequate for Discharge   Problem: Skin Integrity: Goal: Risk for impaired skin integrity will decrease 04/21/2022 1538 by Charlyne Petrin, RN Outcome: Adequate for Discharge 04/21/2022 1537 by Charlyne Petrin, RN Outcome: Adequate for Discharge   Problem: Tissue Perfusion: Goal: Adequacy of tissue perfusion will improve 04/21/2022 1538 by Charlyne Petrin, RN Outcome: Adequate for Discharge 04/21/2022  1537 by Charlyne Petrin, RN Outcome: Adequate for Discharge

## 2022-04-21 NOTE — TOC Progression Note (Addendum)
Transition of Care [facility]) - Progression Note    Patient Details  Name: Alexandria [name] MRN: [id number] Date of Birth: April 11, 1945  Transition of Care [facility]) CM/SW Contact  Leeroy Cha, RN Phone Number: 04/21/2022, 10:37 AM  Clinical Narrative:    Referral number for the ostomy clinic is [id number].  Information to clinic sent. Instrucjtions added to the dc instructions for the patient.  Expected Discharge Plan: [place] Barriers to Discharge: Continued Medical Work up  Expected Discharge Plan and Services In-house Referral: Clinical Social Work Discharge Planning Services: CM Consult Post Acute Care Choice: Home Health, Durable Medical Equipment Living arrangements for the past 2 months: [place]                 DME Arranged: Walker rolling with seat DME Agency: [employer] Date DME Agency Contacted: 04/17/22 Time DME Agency Contacted: [phone] Representative spoke with at DME Agency: Erasmo Downer [place]: PT, RN, [place] Agency: [place] Date [place]: 04/17/22 Time [place]: [phone] Representative spoke with at [place]: [place] ([place]) Interventions [place]: No Food Insecurity (03/31/2022)  Housing: Low Risk  (03/31/2022)  Transportation Needs: No Transportation Needs (03/31/2022)  Utilities: Not At Risk (03/31/2022)  Depression (PHQ2-9): Low Risk  (02/10/2022)  Tobacco Use: Low Risk  (04/20/2022)    Readmission Risk Interventions   Row Labels 04/08/2022    2:29 PM 04/03/2022    8:29 AM  Readmission Risk Prevention Plan   Section Header. No data exists in this row.    Post Dischage Appt    Complete  Medication Screening    Complete  Transportation Screening   Complete Complete  PCP or Specialist Appt within 5-7 Days   Complete   Home Care Screening   Complete   Medication Review (RN CM)   Complete

## 2022-04-21 NOTE — Progress Notes (Addendum)
Referring Physician(s): Dr. Fredrik Cove  Supervising Physician: Corrie Mckusick  Patient Status:  [facility] - In-pt  Chief Complaint:  Post op pelvic abscess s/p 14 Fr abscess rain on 1.7.24 by IR Attending Dr. Lambert Keto  Subjective:  Patient endorses nausea. States that she is anxious to go home and take care of the abscess drain.   Allergies: Prednisone, Dilaudid [hydromorphone], Atrovent hfa [ipratropium bromide hfa], Cheratussin ac [guaifenesin-codeine], Diltiazem, Hydrocodone, Influenza vaccines, Morphine, Motrin [ibuprofen], Oxycodone, and Codeine  Medications: Prior to Admission medications   Medication Sig Start Date End Date Taking? Authorizing Provider  aspirin EC 81 MG tablet Take 81 mg by mouth daily. Swallow whole.   Yes [provider]  benzonatate (TESSALON) 100 MG capsule Take 200 mg by mouth as needed for cough. 03/06/22  Yes [provider]  furosemide (LASIX) 40 MG tablet TAKE 1 TABLET BY MOUTH TWICE A DAY Patient taking differently: Take 40 mg by mouth 2 (two) times daily. 09/03/21  Yes Lowne Chase, Yvonne R, DO  KLOR-CON M20 20 MEQ tablet Take 1 tablet (20 mEq total) by mouth 2 (two) times daily. Will take 3 a daily only as needed. Patient taking differently: Take 20 mEq by mouth 2 (two) times daily. 01/06/22  Yes [name], DO  linaclotide (LINZESS) 145 MCG CAPS capsule Take 1 capsule (145 mcg total) by mouth daily before breakfast. 03/17/22  Yes Sharyn Creamer, MD  Nintedanib (OFEV) 100 MG CAPS Take 100 mg by mouth in the morning and at bedtime. 07/03/20  Yes [provider]  ondansetron (ZOFRAN-ODT) 4 MG disintegrating tablet TAKE 1 TABLET BY MOUTH EVERY 8 HOURS AS NEEDED FOR NAUSEA AND VOMITING Patient taking differently: Take 4 mg by mouth as needed for nausea or vomiting. 07/18/21  Yes [name], DO  SYNTHROID 112 MCG tablet Take 1 tablet (112 mcg total) by mouth daily before breakfast. 03/02/22  Yes Roma Schanz  R, DO  vitamin C (ASCORBIC ACID) 500 MG tablet Take 500 mg by mouth daily.   Yes [provider]  AMBULATORY NON FORMULARY MEDICATION Medication Name: Diltiazem 2% with Lidocaine 5%- use index finger, apply small amount of medication inside the rectum up to your first knuckle/joint for 8 weeks Patient not taking: Reported on 03/29/2022 03/20/22   Sharyn Creamer, MD     Vital Signs: BP (!) 152/69 (BP Location: Right Arm)   Pulse 70   Temp 98.1 F (36.7 C) (Oral)   Resp 16   Ht '5\' 4"'$  (1.626 m)   Wt 160 lb (72.6 kg)   SpO2 93%   BMI 27.46 kg/m   Physical Exam Vitals and nursing note reviewed.  Constitutional:      Appearance: She is well-developed.  HENT:     Head: Normocephalic and atraumatic.  Eyes:     Conjunctiva/sclera: Conjunctivae normal.  Pulmonary:     Effort: Pulmonary effort is normal.  Abdominal:     Comments: Positive pelvic drain via left transgluteal approach. drain to  gravity bag. Site is unremarkable with no erythema, edema, tenderness, bleeding or drainage noted at exit site.  Dressing is clean dry and intact. 40 ml of thin  dark brown colored fluid noted in  gravity bag. Drain is able to be flushed easily. No leakage or pain with flushing.  Insertion site clean and dry.     Musculoskeletal:        General: Normal range of motion.  Cervical back: Normal range of motion.  Skin:    General: Skin is warm and dry.  Neurological:     Mental Status: She is alert and oriented to person, place, and time.     Imaging: CT ABDOMEN PELVIS W CONTRAST  Result Date: 04/20/2022 CLINICAL DATA:  Intra-abdominal abscess, status post pelvic percutaneous drain placement EXAM: CT ABDOMEN AND PELVIS WITH CONTRAST TECHNIQUE: Multidetector CT imaging of the abdomen and pelvis was performed using the standard protocol following bolus administration of intravenous contrast. RADIATION DOSE REDUCTION: This exam was performed according to the departmental dose-optimization  program which includes automated exposure control, adjustment of the mA and/or kV according to patient size and/or use of iterative reconstruction technique. CONTRAST:  171m OMNIPAQUE IOHEXOL 300 MG/ML  SOLN COMPARISON:  04/11/2022 FINDINGS: Lower chest: Small left, trace right pleural effusions and associated atelectasis or consolidation, similar to prior examination. 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 Hepatobiliary: No solid liver abnormality is seen. No gallstones, gallbladder wall thickening, or biliary dilatation. Pancreas: Unremarkable. No pancreatic ductal dilatation or surrounding inflammatory changes. Spleen: Normal in size without significant abnormality. Adrenals/Urinary Tract: Adrenal glands are unremarkable. Kidneys are normal, without renal calculi, solid lesion, or hydronephrosis. Bladder is unremarkable. Stomach/Bowel: Stomach is within normal limits. Appendix appears normal. 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 Vascular/Lymphatic: Aortic atherosclerosis. No enlarged abdominal or pelvic lymph nodes. Reproductive: Status post hysterectomy. Other: Anasarca, somewhat improved compared to prior examination. Left lower quadrant end colostomy. Interval placement of a left posterior approach percutaneous pigtail drainage catheter within a rim enhancing air and fluid collection in the posterior pelvis adjacent to the rectal stump, now measuring 5.7 x 5.1 x 4.2 cm, previously 8.9 x 8.1 x 6.7 cm when measured similarly (series 2, image 63, series 7, image 58). Musculoskeletal: No acute or significant osseous findings. IMPRESSION: 1. Interval placement of a left posterior approach percutaneous pigtail drainage catheter within a rim enhancing air and fluid collection in the posterior pelvis adjacent to the rectal stump, now measuring 5.7 x 5.1 x 4.2 cm, previously 8.9 x 8.1 x 6.7 cm when measured similarly. 2. Status post sigmoid colon resection with left lower  quadrant end colostomy. 3. Small left, trace right pleural effusions and associated atelectasis or consolidation, similar to prior examination. 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 Aortic Atherosclerosis (ICD10-I70.0). Electronically Signed   By: ADelanna AhmadiM.D.   On: 04/20/2022 13:28    Labs:  CBC: Recent Labs    04/18/22 0355 04/19/22 0303 04/20/22 0252 04/21/22 0241  WBC 7.3 6.3 5.4 5.0  HGB 9.8* 9.5* 9.1* 9.7*  HCT 31.8* 30.2* 29.2* 31.0*  PLT 313 318 330 347    COAGS: Recent Labs    03/30/22 0129  INR 1.2    BMP: Recent Labs    04/18/22 0355 04/19/22 0303 04/20/22 0252 04/21/22 0241  NA 135 135 133* 136  K 4.0 3.6 3.2* 3.5  CL 101 102 102 102  CO2 '26 25 25 24  '$ GLUCOSE 84 82 85 77  BUN '13 10 9 '$ 6*  CALCIUM 8.1* 8.0* 7.7* 7.9*  CREATININE 0.65 0.59 0.67 0.58  GFRNONAA >60 >60 >60 >60    LIVER FUNCTION TESTS: Recent Labs    04/18/22 0355 04/19/22 0303 04/20/22 0252 04/21/22 0241  BILITOT 0.4 0.5 0.6 0.5  AST 33 '23 20 20  '$ ALT '30 21 18 17  '$ ALKPHOS 62 59 53 58  PROT 6.3* 6.2* 6.0* 6.4*  ALBUMIN 2.1*  2.2* 2.1* 2.2*    Assessment and Plan:  77 y.o. female inpatient History of rectosigmoid resection with colostomy  found to have a post op pelvic abscess. IR placed a 14 Fr abscess rain on 1.7.24. CT abd pelvis from 1.15.24 reads   Interval placement of a left posterior approach percutaneous pigtail drainage catheter within a rim enhancing air and fluid collection in the posterior pelvis adjacent to the rectal stump, now measuring 5.7 x 5.1 x 4.2 cm, previously 8.9 x 8.1 x 6.7 cm when measured similarly. Drain was placed to a gravity bag on 1.15.24 due to concern for possible fistula.    Drain Location: Pelvic drain via left transgluteal approach Size: Fr size: 14 Fr Date of placement: 1.7.24  Currently to: Drain collection device: gravity 24 hour output:  Output by Drain (mL) 04/19/22 0701 - 04/19/22 1900 04/19/22 1901 - 04/20/22 0700 04/20/22  0701 - 04/20/22 1900 04/20/22 1901 - 04/21/22 0700 04/21/22 0701 - 04/21/22 0734  Closed System Drain 1 Left Buttock Bulb (JP) 12 Fr. 40 20 65     No leukocytosis present today. Cultures from abscess drew abundant enterococcus faecalis, bacteroides fragilis and bactericides ovates.  Interval imaging/drain manipulation:  CT abd pelvis from 1.15.24  Current examination: Flushes/aspirates easily.  Insertion site unremarkable. Suture and stat lock in place. Dressed appropriately.   Plan: Continue TID flushes with 5 cc NS. Record output Q shift. Dressing changes QD or PRN if soiled.  Call IR APP or on call IR MD if difficulty flushing or sudden change in drain output.  Repeat imaging/possible drain injection once output < 10 mL/QD (excluding flush material). Consideration for drain removal if output is < 10 mL/QD (excluding flush material), pending discussion with the providing surgical service.  Discharge planning: Please contact IR APP or on call IR MD prior to patient d/c to ensure appropriate follow up plans are in place. Typically patient will follow up with IR clinic 10-14 days post d/c for repeat imaging/possible drain injection. IR scheduler will contact patient with date/time of appointment. Patient will need to flush drain QD with 5 cc NS, record output QD, dressing changes every 2-3 days or earlier if soiled. Education provided to the Patient at bedside. Requested bedside nurse reinforce education with the Patient and her daughter prior to discharge today. All questions and concerns answered at this time.    Electronically Signed: Jacqualine Mau, NP 04/21/2022, 7:34 AM   I spent a total of 15 Minutes at the patient's bedside AND on the patient's hospital floor or unit, greater than 50% of which was counseling/coordinating care for pelvic abscess drain

## 2022-04-21 NOTE — Discharge Summary (Addendum)
Physician Discharge Summary   Patient: Alexandria [name] MRN: [id number] DOB: 01-15-1946  Admit date:     03/28/2022  Discharge date: 04/21/22  Discharge Physician: Vernelle Emerald   PCP: [name], DO   Recommendations at discharge:   Patient to perform drain and ostomy care as instructed at time of discharge Patient to follow-up with interventional radiology, general surgery and infectious disease Patient to take a total of 6 weeks of oral antibiotic therapy at the recommendation of infectious disease Patient instructed to remain compliant with the entirety of her oral antibiotic therapy to complete a total of a 6-week course.   Discharge Diagnoses: Principal Problem:   Diverticulitis of large intestine with perforation and abscess Active Problems:   Pelvic abscess in female   Ileus following gastrointestinal surgery (HCC)   Toxic metabolic encephalopathy   Acute blood loss anemia   Chronic diastolic CHF (congestive heart failure) (HCC)   Idiopathic pulmonary fibrosis (HCC)   Essential hypertension   Rectal bleeding   Hypothyroidism   Hypokalemia   Hx of myocardial infarction   Intra-abdominal abscess (HCC)   IDA (iron deficiency anemia)  Resolved Problems:   * No resolved hospital problems. *   Hospital Course: 77 year old female with past medical history of hypothyroidism, chronic diastolic ingestive heart failure (echo 06/2020 EF 60-65% with G1DD), idiopathic pulmonary fibrosis (follows with Dr. [name] at [facility]), hypertension, diverticulosis and recent identification of a rectosigmoid mass presenting with abdominal pain and constipation.    Of note,  CT 03/20/2022 status post flexible sigmoidoscopy performed that same day by Dr. [name] with initial biopsy revealing no dysplasia or malignancy.  Patient had a follow-up MRI 12/21 revealing severe sigmoid diverticulosis with concentric wall thickening involving the distal sigmoid colon and upper rectum  and adjacent 3.6 cm multiloculated fluid collection favoring diverticulitis and diverticular abscess (but not read until 12/26).  Upon eventual presentation to [facility] emergency department on 12/23 patient reported not having a bowel movement for 7 days.  At that time, MRI results were not yet available and patient was hospitalized for severe constipation.  Hospitalist group was called and patient was admitted to the hospital.  Dr. [name] and Dr. [name] with general surgery was consulted.  CT imaging of the abdomen pelvis performed on 12/24 in addition to 12/21 MRI seems consistent with peridiverticular abscess formation.  Patient was placed on intravenous antibiotic therapy at that time.  Interventional radiology was consulted but unfortunately drainage could not be performed.  Patient eventually underwent rectosigmoid resection with colostomy performed by Dr. [name] on 03/31/2022 with continued antibiotics afterwards.  On 04/11/2022 patient began to develop recurrence of fever and worsening abdominal pain with rectal bleeding.  Repeat CT imaging consistent with new abscess formation measuring 8.9 x 8.1 x 6.7 cm with concern for leakage from the stump.  On 04/12/2022 patient underwent IR guided drainage of the intra-abdominal abscess.  Cultures from 1/7 abscess drainage growing out Enterococcus faecalis.  Hospitalization is also been complicated by postoperative ileus with requiring temporary use of TPN.  This has since been discontinued on 1/13.  Patient also received a total of 2 units of transfused packed red blood cells throughout the hospitalization for recurrent anemia with last transfusion being on 1/8.    Patient slowly improved throughout the hospitalization with intravenous antibiotics.  Repeat CT imaging performed on 04/20/2022 revealing abscesses now measuring 5.7 x 5.1 x 4.2 cm in the posterior pelvis adjacent to the rectal stump with discernible reduction  in size compared to prior.   Based on this information infectious disease recommended a total of 6 weeks of oral Augmentin from the time of drain placement.  Patient was taught about wound care and ostomy care and arranges were made for the patient to be discharged in improved and stable condition on 04/22/2022 with follow-up with infectious disease, general surgery and interventional radiology.     Pain control - [employer] Controlled Substance Reporting System database was reviewed. and patient was instructed, not to drive, operate heavy machinery, perform activities at heights, swimming or participation in water activities or provide baby-sitting services while on Pain, Sleep and Anxiety Medications; until their outpatient Physician has advised to do so again. Also recommended to not to take more than prescribed Pain, Sleep and Anxiety Medications.   Consultants: Dr. [name] with Infectious Disease, Dr. [name] with General Surgery, Dr. Maryelizabeth Kaufmann with Interventional Radiology Procedures performed:    Rectosigmoid resection with colostomy performed by Dr. [name] 03/31/2022. IR guided drainage of pelvic abscess performed 1/7 by interventional radiology Disposition: Home Diet recommendation:  Discharge Diet Orders (From admission, onward)     Start     Ordered   04/21/22 0000  Diet - low sodium heart healthy        04/21/22 1406           Regular diet  DISCHARGE MEDICATION: Allergies as of 04/21/2022       Reactions   Prednisone Other (See Comments)   Changed personality    Dilaudid [hydromorphone] Itching, Other (See Comments)   Dilaudid caused marked confusion   Atrovent Hfa [ipratropium Bromide Hfa] Itching, Other (See Comments)   Pt could not sleep   Cheratussin Ac [guaifenesin-codeine] Other (See Comments)   Headaches    Diltiazem Itching, Other (See Comments)   Makes patient sick. Shakes. GI   Hydrocodone Itching   Influenza Vaccines Other (See Comments)   Pt reports heart attack after last flu shot    Morphine Itching   Motrin [ibuprofen] Itching, Other (See Comments)   "Gives false reading in blood"   Oxycodone Itching   Codeine Itching, Nausea Only, Other (See Comments)   Pt takes promethazine with codeine at home        Medication List     STOP taking these medications    Klor-Con M20 20 MEQ tablet Generic drug: potassium chloride SA   Nintedanib 100 MG Caps Commonly known as: Ofev       TAKE these medications    AMBULATORY NON FORMULARY MEDICATION Medication Name: Diltiazem 2% with Lidocaine 5%- use index finger, apply small amount of medication inside the rectum up to your first knuckle/joint for 8 weeks   amoxicillin-clavulanate 875-125 MG tablet Commonly known as: AUGMENTIN Take 1 tablet by mouth 2 (two) times daily.   ascorbic acid 500 MG tablet Commonly known as: VITAMIN C Take 500 mg by mouth daily.   aspirin EC 81 MG tablet Take 81 mg by mouth daily. Swallow whole.   benzonatate 100 MG capsule Commonly known as: TESSALON Take 200 mg by mouth as needed for cough.   furosemide 40 MG tablet Commonly known as: LASIX Take 1 tablet (40 mg total) by mouth daily as needed (if weight increases by more than 2 pounds in 1 day or 5 pounds in 1 week.). What changed:  when to take this reasons to take this   linaclotide 145 MCG Caps capsule Commonly known as: Linzess Take 1 capsule (145 mcg total) by mouth daily before  breakfast.   lisinopril 20 MG tablet Commonly known as: ZESTRIL Take 1 tablet (20 mg total) by mouth daily.   ondansetron 4 MG disintegrating tablet Commonly known as: ZOFRAN-ODT TAKE 1 TABLET BY MOUTH EVERY 8 HOURS AS NEEDED FOR NAUSEA AND VOMITING What changed:  how much to take how to take this when to take this reasons to take this additional instructions   Synthroid 112 MCG tablet Generic drug: levothyroxine Take 1 tablet (112 mcg total) by mouth daily before breakfast.   traMADol 50 MG tablet Commonly known as:  ULTRAM Take 2 tablets (100 mg total) by mouth every 6 (six) hours as needed for up to 3 days for moderate pain or severe pain. What changed:  how much to take when to take this reasons to take this               Discharge Care Instructions  (From admission, onward)           Start     Ordered   04/21/22 0000  Discharge wound care:       Comments: Drain care and ostomy care as directed   04/21/22 1406   04/17/22 0000  Change dressing (specify)       Comments: Dressing change: as needed to keep clean and dry   04/17/22 1612            Follow-up Information     Mugweru, Wille Glaser, MD Follow up.   Specialties: Interventional Radiology, Diagnostic Radiology, Radiology Why: Schedulers will contact you with date and time of follow-up appointment. Contact information: [address] [place] [address] [phone]         Michael Boston, MD Follow up on 05/12/2022.   Specialties: General Surgery, Colon and Rectal Surgery Why: 9:30am, Arrive 30 minutes prior to your appointment time, Please bring your insurance card and photo ID Contact information: [place] [address] [phone]         [facility]. Schedule an appointment as soon as possible for a visit.   Specialty: General Surgery Why: referral number is [phone]  Call day of discharge and make first appointment. Contact information: [address] [id number] [place] [phone]        Jabier Mutton, MD. Go on 05/14/2022.   Specialty: Infectious Diseases Contact information: [address] [place] [address] [phone]                 Discharge Exam: Filed Weights   04/12/22 0600 04/16/22 0500 04/17/22 0455  Weight: 70.1 kg 68.8 kg 72.6 kg    Constitutional: Awake alert and oriented x3, no associated distress.   Respiratory: clear to auscultation bilaterally, no  wheezing, no crackles. Normal respiratory effort. No accessory muscle use.  Cardiovascular: Regular rate and rhythm, no murmurs / rubs / gallops. No extremity edema. 2+ pedal pulses. No carotid bruits.  Abdomen: Very mild lower abdominal tenderness.  Ostomy in place with pink mucosa draining brown stool.  Musculoskeletal: No joint deformity upper and lower extremities. Good ROM, no contractures. Normal muscle tone.     Condition at discharge: fair  The results of significant diagnostics from this hospitalization (including imaging, microbiology, ancillary and laboratory) are listed below for reference.   Imaging Studies: CT ABDOMEN PELVIS W CONTRAST  Result Date: 04/20/2022 CLINICAL DATA:  Intra-abdominal abscess, status post pelvic percutaneous drain placement EXAM: CT ABDOMEN AND PELVIS WITH  CONTRAST TECHNIQUE: Multidetector CT imaging of the abdomen and pelvis was performed using the standard protocol following bolus administration of intravenous contrast. RADIATION DOSE REDUCTION: This exam was performed according to the departmental dose-optimization program which includes automated exposure control, adjustment of the mA and/or kV according to patient size and/or use of iterative reconstruction technique. CONTRAST:  127m OMNIPAQUE IOHEXOL 300 MG/ML  SOLN COMPARISON:  04/11/2022 FINDINGS: Lower chest: Small left, trace right pleural effusions and associated atelectasis or consolidation, similar to prior examination. 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 Hepatobiliary: No solid liver abnormality is seen. No gallstones, gallbladder wall thickening, or biliary dilatation. Pancreas: Unremarkable. No pancreatic ductal dilatation or surrounding inflammatory changes. Spleen: Normal in size without significant abnormality. Adrenals/Urinary Tract: Adrenal glands are unremarkable. Kidneys are normal, without renal calculi, solid lesion, or hydronephrosis. Bladder is unremarkable. Stomach/Bowel:  Stomach is within normal limits. Appendix appears normal. 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 Vascular/Lymphatic: Aortic atherosclerosis. No enlarged abdominal or pelvic lymph nodes. Reproductive: Status post hysterectomy. Other: Anasarca, somewhat improved compared to prior examination. Left lower quadrant end colostomy. Interval placement of a left posterior approach percutaneous pigtail drainage catheter within a rim enhancing air and fluid collection in the posterior pelvis adjacent to the rectal stump, now measuring 5.7 x 5.1 x 4.2 cm, previously 8.9 x 8.1 x 6.7 cm when measured similarly (series 2, image 63, series 7, image 58). Musculoskeletal: No acute or significant osseous findings. IMPRESSION: 1. Interval placement of a left posterior approach percutaneous pigtail drainage catheter within a rim enhancing air and fluid collection in the posterior pelvis adjacent to the rectal stump, now measuring 5.7 x 5.1 x 4.2 cm, previously 8.9 x 8.1 x 6.7 cm when measured similarly. 2. 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 3. Small left, trace right pleural effusions and associated atelectasis or consolidation, similar to prior examination. 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 Aortic Atherosclerosis (ICD10-I70.0). Electronically Signed   By: ADelanna AhmadiM.D.   On: 04/20/2022 13:28   CT GUIDED PERITONEAL/RETROPERITONEAL FLUID DRAIN BY PERC CATH  Result Date: 04/12/2022 INDICATION: Prior sigmoid resection. Pelvic abscess suspicious for anastomotic breakdown. EXAM: CT-GUIDED LEFT TRANSGLUTEAL DRAIN PLACEMENT FOR PELVIC ABCESS COMPARISON:  CT AP, 04/11/2022 and 04/06/2022. MEDICATIONS: The patient is currently admitted to the hospital and receiving intravenous antibiotics. The antibiotics were administered within an appropriate time frame prior to the initiation of the procedure. ANESTHESIA/SEDATION: Moderate (conscious) sedation  was employed during this procedure. A total of Versed 1.5 mg and Fentanyl 75 mcg was administered intravenously. Moderate Sedation Time: 15 minutes. The patient's level of consciousness and vital signs were monitored continuously by radiology nursing throughout the procedure under my direct supervision. CONTRAST:  None COMPLICATIONS: None immediate. PROCEDURE: RADIATION DOSE REDUCTION: This exam was performed according to the departmental dose-optimization program which includes automated exposure control, adjustment of the mA and/or kV according to patient size and/or use of iterative reconstruction technique. Informed written consent was obtained from the patient and/or patient's representative after a discussion of the risks, benefits and alternatives to treatment. The patient was placed prone on the CT gantry and a pre procedural CT was performed re-demonstrating the known abscess/fluid collection within the deep pelvis. The procedure was planned. A timeout was performed prior to the initiation of the procedure. The LEFT gluteus was prepped and draped in the usual sterile fashion. The overlying soft tissues were anesthetized with 1% lidocaine with epinephrine. Appropriate trajectory was  planned with the use of a 22 gauge spinal needle. An 18 gauge trocar needle was advanced into the abscess/fluid collection and a short Amplatz super stiff wire was coiled within the collection. Appropriate positioning was confirmed with a limited CT scan. The tract was serially dilated allowing placement of a 12 Fr drainage catheter. Appropriate positioning was confirmed with a limited postprocedural CT scan. 5 ml of serosanguineous fluid was aspirated. The tube was connected to a drainage bag and sutured in place. A dressing was placed. The patient tolerated the procedure well without immediate post procedural complication. IMPRESSION: Successful CT guided placement of a 12 Fr drainage catheter via LEFT transgluteal approach  into the pelvic abscess. Samples were sent to the laboratory as requested by the ordering clinical team. Michaelle Birks, MD [facility] Electronically Signed   By: Michaelle Birks M.D.   On: 04/12/2022 15:14   CT ABDOMEN PELVIS WO CONTRAST  Result Date: 04/11/2022 CLINICAL DATA:  Postoperative abdominal pain EXAM: CT ABDOMEN AND PELVIS WITHOUT CONTRAST TECHNIQUE: Multidetector CT imaging of the abdomen and pelvis was performed following the standard protocol without IV contrast. RADIATION DOSE REDUCTION: This exam was performed according to the departmental dose-optimization program which includes automated exposure control, adjustment of the mA and/or kV according to patient size and/or use of iterative reconstruction technique. 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 Hepatobiliary: No solid liver abnormality is seen. No gallstones, gallbladder wall thickening, or biliary dilatation. Pancreas: Unremarkable. No pancreatic ductal dilatation or surrounding inflammatory changes. Spleen: Normal in size without significant abnormality. Adrenals/Urinary Tract: Adrenal glands are unremarkable. Kidneys are normal, without renal calculi, solid lesion, or hydronephrosis. Bladder is unremarkable. Stomach/Bowel: Esophagogastric tube with tip and side port below the diaphragm. Stomach is within normal limits. Appendix appears normal. Status post sigmoid colon resection. Vascular/Lymphatic: Aortic atherosclerosis. No enlarged abdominal or pelvic lymph nodes. Reproductive: Status post hysterectomy. Other: Severe anasarca. Left lower quadrant end colostomy. Interval removal of a surgical drain previously seen in the low pelvis, with increased volume of frothy, loculated appearing air and fluid in the posterior pelvis adjacent to the rectal stump, collection measuring at  least 8.9 x 8.1 x 6.7 cm (series 2, image 74, series 6, image 84). Musculoskeletal: No acute or significant osseous findings. IMPRESSION: 1. Interval removal of a surgical drain previously seen in the low pelvis, with increased volume of frothy, loculated appearing air and fluid in the posterior pelvis adjacent to the rectal stump, collection measuring at least 8.9 x 8.1 x 6.7 cm. Findings are consistent with abscess. The stump may be assessed for leak via fluoroscopy if desired. 2. 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 3. Severe anasarca. 4. 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 Aortic Atherosclerosis (ICD10-I70.0). Electronically Signed   By: Delanna Ahmadi M.D.   On: 04/11/2022 13:55   [place] EKG SITE RITE  Result Date: 04/09/2022 If Site Rite image not attached, placement could not be confirmed due to current cardiac rhythm.  DG Abd 1 View  Result Date: 04/08/2022 CLINICAL DATA:  Placement of NG tube EXAM: ABDOMEN - 1 VIEW COMPARISON:  Previous abdomen radiograph done on 04/08/2022, CT done on 04/06/2022 FINDINGS: There is interval placement of enteric tube with its tip in the region of body of stomach. There is dilation of small-bowel loops in mid abdomen. Lower abdomen and pelvis are  not included in the image. There is blunting of right lateral CP angle. Increased interstitial markings are seen in right lower lung field and left parahilar region. IMPRESSION: Tip of enteric tube is seen in the stomach. Electronically Signed   By: Elmer Picker M.D.   On: 04/08/2022 15:33   DG Abd Portable 1V  Result Date: 04/08/2022 CLINICAL DATA:  Nausea and abdominal pain EXAM: PORTABLE ABDOMEN - 1 VIEW COMPARISON:  CT 04/06/2022 FINDINGS: Multiple dilated air-filled loops of small bowel seen throughout the abdomen measuring up to 4.7 cm in diameter moderate.) Similar to the prior x-ray. There is air in nondilated loops of colon  and stomach. No obvious free air on this portable supine radiograph. Curvature and degenerative changes of the spine. IMPRESSION: Moderate air-filled dilatation of the small bowel diffusely. Again possible ileus. Recommend continued surveillance Electronically Signed   By: Jill Side M.D.   On: 04/08/2022 13:23   [place] EKG SITE RITE  Result Date: 04/07/2022 If Site Rite image not attached, placement could not be confirmed due to current cardiac rhythm.  CT ABDOMEN PELVIS W CONTRAST  Result Date: 04/06/2022 CLINICAL DATA:  Postop pain, vomiting EXAM: CT ABDOMEN AND PELVIS WITH CONTRAST TECHNIQUE: Multidetector CT imaging of the abdomen and pelvis was performed using the standard protocol following bolus administration of intravenous contrast. RADIATION DOSE REDUCTION: This exam was performed according to the departmental dose-optimization program which includes automated exposure control, adjustment of the mA and/or kV according to patient size and/or use of iterative reconstruction technique. CONTRAST:  100 mL OMNIPAQUE IOHEXOL 300 MG/ML  SOLN COMPARISON:  03/29/2022 FINDINGS: Lower chest: Peripheral interstitial changes consistent with fibrosis. Minimal left-sided pleural effusion. Atheromatous calcifications of the aorta and coronary arteries. No pneumothorax or pericardial effusion identified. Hepatobiliary: Distended gallbladder. No hepatic parenchymal abnormalities. There is suggestion of mild intrahepatic biliary ductal dilatation. Pancreas: Atrophic appearance without evidence of focal lesions or Peripancreatic inflammatory changes. Spleen: Normal in size without focal abnormality. Adrenals/Urinary Tract: Adrenal glands are unremarkable. Kidneys are normal, without renal calculi, focal lesion, or hydronephrosis. Bladder is empty with a Foley catheter. Stomach/Bowel: Postop changes left-sided partial colectomy. Diverting ostomy left lower quadrant. Mild small bowel dilatation left mid to lower abdomen  consistent with ileus. Vascular/Lymphatic: Aortic atherosclerosis. No enlarged abdominal or pelvic lymph nodes. Reproductive: Status post hysterectomy. No adnexal masses. Other: There is a drain identified with the tip in the posterior pelvis. Fluid collections with air consistent with abscesses that were present previously have diminished. There continues to be evidence of extraluminal air and fluid in the pelvis presacral region. Findings consist of the original abscess measuring up to 5 cm maximum dimension without a well defined wall. Musculoskeletal: No acute or significant osseous findings. IMPRESSION: 1. Pelvic drain in place. Decreased but residual pelvic abscess. Pelvic collection measuring up to 5 cm without well-defined wall. 2. Changes consistent with ileus. 3. Status post sigmoid resection with diverting ostomy. 4. Distended gallbladder and mild intrahepatic ductal dilatation. 5. Small left-sided pleural effusion with evidence of pulmonary interstitial fibrosis. Electronically Signed   By: Sammie Bench M.D.   On: 04/06/2022 12:09   DG Abd Portable 1V  Result Date: 04/05/2022 CLINICAL DATA:  Ileus following gastrointestinal surgery. EXAM: PORTABLE ABDOMEN - 1 VIEW COMPARISON:  One-view abdomen 04/03/2022 FINDINGS: Gas-filled loops of small bowel are present in the mid left abdomen without significant dilation. Gas is present within the colon. Side port of the NG tube is in the proximal stomach. Mild leftward curvature of  the thoracolumbar spine is stable. IMPRESSION: Gas-filled loops of small bowel in the mid left abdomen without significant dilation. Findings are compatible with ileus. Electronically Signed   By: San Morelle M.D.   On: 04/05/2022 12:44   [place] EKG SITE RITE  Result Date: 04/04/2022 If Site Rite image not attached, placement could not be confirmed due to current cardiac rhythm.  DG Abd Portable 1V  Result Date: 04/03/2022 CLINICAL DATA:  77 year old female NG  tube placement. EXAM: PORTABLE ABDOMEN - 1 VIEW COMPARISON:  0831 hours today and earlier. FINDINGS: Portable AP semi upright view at 1102 hours. Enteric tube placed into the stomach, left upper quadrant. Side hole projects at the level of the gastric body. Negative visible bowel gas pattern. Levoconvex thoracolumbar scoliosis. Negative lung bases. IMPRESSION: Satisfactory enteric tube placement into the stomach. Electronically Signed   By: Genevie Ann M.D.   On: 04/03/2022 11:10   DG Abd Portable 1V  Result Date: 04/03/2022 CLINICAL DATA:  Ileus following gastrointestinal surgery EXAM: PORTABLE ABDOMEN - 1 VIEW COMPARISON:  Portable exam 0831 hours compared to 01/26/2022 FINDINGS: Surgical drain in pelvis. Nonobstructive bowel gas pattern. No bowel dilatation or bowel wall thickening. Subcutaneous soft tissue gas within the abdominal wall bilaterally consistent with surgery. Ostomy LEFT lower quadrant. Bones demineralized with degenerative changes thoracolumbar spine. IMPRESSION: Nonobstructive bowel gas pattern. Electronically Signed   By: Lavonia Dana M.D.   On: 04/03/2022 09:06   MR PELVIS WO CM RECTAL CA STAGING  Result Date: 03/31/2022 CLINICAL DATA:  Possible rectal mass on recent CT.  Diverticulitis. EXAM: MRI PELVIS WITHOUT CONTRAST TECHNIQUE: Multiplanar multisequence MR imaging of the pelvis was performed. No intravenous contrast was administered. COMPARISON:  CT on 03/20/2022 FINDINGS: Lower Urinary Tract: Unremarkable urinary bladder. Bowel: Severe diverticulosis is seen involving the sigmoid colon. Concentric wall thickening is seen involving the distal sigmoid colon and upper rectum, measuring approximately 8 cm in length. Adjacent multiloculated fluid collection is seen in the left perirectal and presacral region, which measures 3.6 x 3.0 cm. These findings favor diverticulitis with abscess, with perforated rectal carcinoma considered less likely. Vascular/Lymphatic: Multiple small less than 1  cm left perirectal lymph nodes are seen. No other sites of lymphadenopathy identified. Reproductive: Prior hysterectomy noted. Adnexal regions are unremarkable in appearance. Other: None. Musculoskeletal: No suspicious bone lesions identified. IMPRESSION: Severe sigmoid diverticulosis. Concentric wall thickening involving the distal sigmoid colon and upper rectum, with adjacent 3.6 cm multiloculated fluid collection in the left perirectal and presacral region. These findings favor diverticulitis and diverticular abscess, with perforated rectal carcinoma considered less likely although this cannot be excluded. Recommend clinical correlation and consider continued short-term follow-up imaging with CT. Multiple sub-cm left perirectal lymph nodes, which are nonspecific. These may be reactive in etiology, although metastatic disease cannot be excluded in the setting of rectal carcinoma. Electronically Signed   By: Marlaine Hind M.D.   On: 03/31/2022 14:46   CT ABDOMEN PELVIS W CONTRAST  Result Date: 03/29/2022 CLINICAL DATA:  Constipation. Clinical suspicion for diverticulitis. EXAM: CT ABDOMEN AND PELVIS WITH CONTRAST TECHNIQUE: Multidetector CT imaging of the abdomen and pelvis was performed using the standard protocol following bolus administration of intravenous contrast. RADIATION DOSE REDUCTION: This exam was performed according to the departmental dose-optimization program which includes automated exposure control, adjustment of the mA and/or kV according to patient size and/or use of iterative reconstruction technique. CONTRAST:  170m OMNIPAQUE IOHEXOL 300 MG/ML  SOLN COMPARISON:  03/20/2022.  03/13/2022. FINDINGS: Lower chest: Chronic fibrotic  changes at the lung bases, stable. No acute findings. Hepatobiliary: No focal liver abnormality is seen. No gallstones, gallbladder wall thickening, or biliary dilatation. Pancreas: Pancreatic atrophy.  No mass.  No inflammation. Spleen: Normal in size without focal  abnormality. Adrenals/Urinary Tract: Adrenal glands are unremarkable. Kidneys are normal, without renal calculi, focal lesion, or hydronephrosis. Bladder is unremarkable. Stomach/Bowel: Wall thickening of the distal sigmoid colon. There is adjacent inflammatory change. An irregular extraluminal fluid collection, containing non dependent air, tracks along the lower sigmoid colon to the level of the rectosigmoid junction, measuring approximately 6 cm from superior to inferior by 2.6 x 2.6 cm transversely. There are numerous diverticula along the remainder of the sigmoid colon with no other areas of wall thickening or inflammation. Remainder of the colon is normal in caliber with no evidence of inflammation. Normal stomach and small bowel. Normal appendix. Vascular/Lymphatic: Aortic atherosclerosis. No aneurysm. No enlarged lymph nodes. Reproductive: Status post hysterectomy.  No pelvic/adnexal masses. Other: No hernia.  No ascites.  No free air. Musculoskeletal: No fracture or acute finding.  No bone lesion. IMPRESSION: 1. Current findings are consistent with complicated sigmoid diverticulitis. Distal sigmoid colon shows wall thickening and adjacent inflammation. There is an adjacent irregular fluid collection consistent with a peridiverticular abscess in the posteroinferior pelvis that measures 6 x 2.6 x 2.6 cm. Inflammatory changes have become more evident compared to the prior CTs. No bowel obstruction. 2. No other acute abnormality.  No free air. Electronically Signed   By: Lajean Manes M.D.   On: 03/29/2022 11:32    Microbiology: Results for orders placed or performed during the hospital encounter of 03/28/22  Culture, blood (Routine X 2) w Reflex to ID Panel     Status: None   Collection Time: 04/11/22 12:53 PM   Specimen: Right Antecubital; Blood  Result Value Ref Range Status   Specimen Description   Final    RIGHT ANTECUBITAL BOTTLES DRAWN AEROBIC ONLY Performed at [facility] [address] [place], [place] [address]    Special Requests   Final    Blood Culture adequate volume Performed at [facility] [address] [place], [place] [address]    Culture   Final    NO GROWTH 5 DAYS Performed at [facility], [place] [address], [place], [place] [address]    Report Status 04/16/2022 FINAL  Final  Culture, blood (Routine X 2) w Reflex to ID Panel     Status: None   Collection Time: 04/11/22 12:53 PM   Specimen: BLOOD RIGHT HAND  Result Value Ref Range Status   Specimen Description   Final    BLOOD RIGHT HAND BOTTLES DRAWN AEROBIC ONLY Performed at [facility], [place] [address], [place], [place] [address]    Special Requests   Final    Blood Culture adequate volume Performed at [facility] [address] [place], [place] [address]    Culture   Final    NO GROWTH 5 DAYS Performed at [facility], [place] [address], [place], [place] [address]    Report Status 04/16/2022 FINAL  Final  Aerobic/Anaerobic Culture w Gram Stain (surgical/deep wound)     Status: None   Collection Time: 04/12/22  1:19 PM   Specimen: Abscess  Result Value Ref Range Status   Specimen Description   Final    ABSCESS Performed at [facility] [address], [place], [place] [address]  Special Requests   Final    NONE Performed at [facility], [place] [address] [place], [place] [address]    Gram Stain   Final    ABUNDANT WBC PRESENT, PREDOMINANTLY PMN ABUNDANT GRAM POSITIVE COCCI IN PAIRS IN CLUSTERS FEW GRAM NEGATIVE RODS    Culture   Final    ABUNDANT ENTEROCOCCUS FAECALIS ABUNDANT BACTEROIDES FRAGILIS ABUNDANT BACTEROIDES OVATUS BETA LACTAMASE POSITIVE Performed at [facility], [employer] [address], [place],  [address]    Report Status 04/15/2022 FINAL  Final   Organism ID, Bacteria ENTEROCOCCUS FAECALIS  Final      Susceptibility   Enterococcus faecalis - MIC*     AMPICILLIN <=2 SENSITIVE Sensitive     VANCOMYCIN 2 SENSITIVE Sensitive     GENTAMICIN SYNERGY SENSITIVE Sensitive     * ABUNDANT ENTEROCOCCUS FAECALIS    Labs: CBC: Recent Labs  Lab 04/17/22 0342 04/18/22 0355 04/19/22 0303 04/20/22 0252 04/21/22 0241  WBC 7.2 7.3 6.3 5.4 5.0  NEUTROABS 3.0 3.0 2.6 1.8 1.7  HGB 9.7* 9.8* 9.5* 9.1* 9.7*  HCT 30.6* 31.8* 30.2* 29.2* 31.0*  MCV 102.7* 101.9* 101.3* 101.4* 101.3*  PLT 303 313 318 330 098   Basic Metabolic Panel: Recent Labs  Lab 04/16/22 0333 04/17/22 0342 04/18/22 0355 04/19/22 0303 04/20/22 0252 04/21/22 0241  NA 138 135 135 135 133* 136  K 4.0 4.3 4.0 3.6 3.2* 3.5  CL 107 102 101 102 102 102  CO2 '26 26 26 25 25 24  '$ GLUCOSE 124* 100* 84 82 85 77  BUN '12 14 13 10 9 '$ 6*  CREATININE 0.56 0.54 0.65 0.59 0.67 0.58  CALCIUM 7.9* 8.0* 8.1* 8.0* 7.7* 7.9*  MG 2.5* 2.1 2.3 2.1 2.0 2.1  PHOS 2.7  --   --   --  3.0 2.4*   Liver Function Tests: Recent Labs  Lab 04/17/22 0342 04/18/22 0355 04/19/22 0303 04/20/22 0252 04/21/22 0241  AST 33 33 '23 20 20  '$ ALT '28 30 21 18 17  '$ ALKPHOS 60 62 59 53 58  BILITOT 0.3 0.4 0.5 0.6 0.5  PROT 6.2* 6.3* 6.2* 6.0* 6.4*  ALBUMIN 2.1* 2.1* 2.2* 2.1* 2.2*   CBG: Recent Labs  Lab 04/15/22 0432 04/15/22 1137 04/15/22 1656 04/15/22 2048 04/16/22 0727  GLUCAP 120* 139* 131* 101* 126*    Discharge time spent: greater than 30 minutes.  Signed: Vernelle Emerald, MD [facility] 04/22/2022

## 2022-04-21 NOTE — Progress Notes (Signed)
Progress Note  21 Days Post-Op  Subjective: Some nausea this am but ate breakfast without problems. Having bowel function. Pain controlled. ambulating  Objective: Vital signs in last 24 hours: Temp:  [97.9 F (36.6 C)-98.2 F (36.8 C)] 98.1 F (36.7 C) (01/16 0520) Pulse Rate:  [67-70] 70 (01/16 0520) Resp:  [14-16] 16 (01/16 0520) BP: (152-166)/(62-74) 152/69 (01/16 0520) SpO2:  [93 %-97 %] 93 % (01/16 0520) Last BM Date : 04/21/22  Intake/Output from previous day: 01/15 0701 - 01/16 0700 In: 1565.1 [P.O.:880; IV Piggyback:670.1] Out: 67 [Urine:2; Drains:65] Intake/Output this shift: No intake/output data recorded.  PE: General: WD, chronically ill appearing female who is laying in bed in NAD Heart: regular, rate, and rhythm.  Lungs:  Respiratory effort nonlabored Abd: soft, appropriately ttp, ND, +BS, stoma viable with gas and small amount stool in bag, incisions C/D/I, drain with brown tinged bloody fluid which is cloudy    Lab Results:  Recent Labs    04/20/22 0252 04/21/22 0241  WBC 5.4 5.0  HGB 9.1* 9.7*  HCT 29.2* 31.0*  PLT 330 347    BMET Recent Labs    04/20/22 0252 04/21/22 0241  NA 133* 136  K 3.2* 3.5  CL 102 102  CO2 25 24  GLUCOSE 85 77  BUN 9 6*  CREATININE 0.67 0.58  CALCIUM 7.7* 7.9*    PT/INR No results for input(s): "LABPROT", "INR" in the last 72 hours. CMP     Component Value Date/Time   NA 136 04/21/2022 0241   NA 140 03/21/2021 1214   K 3.5 04/21/2022 0241   CL 102 04/21/2022 0241   CO2 24 04/21/2022 0241   GLUCOSE 77 04/21/2022 0241   BUN 6 (L) 04/21/2022 0241   BUN 21 03/21/2021 1214   CREATININE 0.58 04/21/2022 0241   CREATININE 0.74 03/13/2022 1511   CALCIUM 7.9 (L) 04/21/2022 0241   PROT 6.4 (L) 04/21/2022 0241   ALBUMIN 2.2 (L) 04/21/2022 0241   AST 20 04/21/2022 0241   ALT 17 04/21/2022 0241   ALKPHOS 58 04/21/2022 0241   BILITOT 0.5 04/21/2022 0241   GFRNONAA >60 04/21/2022 0241   GFRAA 85 05/24/2020  1006   Lipase     Component Value Date/Time   LIPASE 25 03/28/2022 1904       Studies/Results: CT ABDOMEN PELVIS W CONTRAST  Result Date: 04/20/2022 CLINICAL DATA:  Intra-abdominal abscess, status post pelvic percutaneous drain placement EXAM: CT ABDOMEN AND PELVIS WITH CONTRAST TECHNIQUE: Multidetector CT imaging of the abdomen and pelvis was performed using the standard protocol following bolus administration of intravenous contrast. RADIATION DOSE REDUCTION: This exam was performed according to the departmental dose-optimization program which includes automated exposure control, adjustment of the mA and/or kV according to patient size and/or use of iterative reconstruction technique. CONTRAST:  136m OMNIPAQUE IOHEXOL 300 MG/ML  SOLN COMPARISON:  04/11/2022 FINDINGS: Lower chest: Small left, trace right pleural effusions and associated atelectasis or consolidation, similar to prior examination. 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 Hepatobiliary: No solid liver abnormality is seen. No gallstones, gallbladder wall thickening, or biliary dilatation. Pancreas: Unremarkable. No pancreatic ductal dilatation or surrounding inflammatory changes. Spleen: Normal in size without significant abnormality. Adrenals/Urinary Tract: Adrenal glands are unremarkable. Kidneys are normal, without renal calculi, solid lesion, or hydronephrosis. Bladder is unremarkable. Stomach/Bowel: Stomach is within normal limits. Appendix appears normal. 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 Vascular/Lymphatic: Aortic atherosclerosis. No enlarged abdominal or pelvic lymph nodes. Reproductive:  Status post hysterectomy. Other: Anasarca, somewhat improved compared to prior examination. Left lower quadrant end colostomy. Interval placement of a left posterior approach percutaneous pigtail drainage catheter within a rim enhancing air and fluid collection in the posterior pelvis adjacent to  the rectal stump, now measuring 5.7 x 5.1 x 4.2 cm, previously 8.9 x 8.1 x 6.7 cm when measured similarly (series 2, image 63, series 7, image 58). Musculoskeletal: No acute or significant osseous findings. IMPRESSION: 1. Interval placement of a left posterior approach percutaneous pigtail drainage catheter within a rim enhancing air and fluid collection in the posterior pelvis adjacent to the rectal stump, now measuring 5.7 x 5.1 x 4.2 cm, previously 8.9 x 8.1 x 6.7 cm when measured similarly. 2. 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 3. Small left, trace right pleural effusions and associated atelectasis or consolidation, similar to prior examination. 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 Aortic Atherosclerosis (ICD10-I70.0). Electronically Signed   By: Delanna Ahmadi M.D.   On: 04/20/2022 13:28    Anti-infectives: Anti-infectives (From admission, onward)    Start     Dose/Rate Route Frequency Ordered Stop   04/21/22 0851  amoxicillin-clavulanate (AUGMENTIN) 875-125 MG per tablet 1 tablet        1 tablet Oral Every 12 hours 04/21/22 0853 05/24/22 0959   04/18/22 1000  Ampicillin-Sulbactam (UNASYN) 3 g in sodium chloride 0.9 % 100 mL IVPB        3 g 200 mL/hr over 30 Minutes Intravenous Every 6 hours 04/18/22 0901 04/21/22 0356   04/15/22 1200  ampicillin (OMNIPEN) 1 g in sodium chloride 0.9 % 100 mL IVPB  Status:  Discontinued        1 g 300 mL/hr over 20 Minutes Intravenous Every 6 hours 04/15/22 1044 04/15/22 1053   04/15/22 1200  Ampicillin-Sulbactam (UNASYN) 3 g in sodium chloride 0.9 % 100 mL IVPB        3 g 200 mL/hr over 30 Minutes Intravenous Every 6 hours 04/15/22 1053 04/18/22 0535   04/11/22 1400  piperacillin-tazobactam (ZOSYN) IVPB 3.375 g  Status:  Discontinued        3.375 g 12.5 mL/hr over 240 Minutes Intravenous Every 8 hours 04/11/22 1143 04/15/22 1044   04/05/22 1600  piperacillin-tazobactam (ZOSYN) IVPB 3.375 g        3.375  g 12.5 mL/hr over 240 Minutes Intravenous Every 8 hours 04/05/22 1222 04/10/22 1230   03/31/22 1045  cefoTEtan (CEFOTAN) 2 g in sodium chloride 0.9 % 100 mL IVPB        2 g 200 mL/hr over 30 Minutes Intravenous On call to O.R. 03/31/22 0959 03/31/22 1225   03/29/22 0800  piperacillin-tazobactam (ZOSYN) IVPB 3.375 g  Status:  Discontinued        3.375 g 12.5 mL/hr over 240 Minutes Intravenous Every 8 hours 03/29/22 0746 04/05/22 0826        Assessment/Plan  POD#21 s/p robotic lower anterior rectosigmoid resection with colostomy and drainage of pelvic abscess by Dr. 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 Abx per ID - CT 1/15 with decreased size of fluid collection -drain with some thicker bloody/cloudy output. Rectal stump leak.  Drain should remain in place at time of discharge. Drain teaching by [employer] prior to [employer]. Daughter will need to learn as well - soft diet and miralax, TNA off - hgb 9.7 s/p 2 units prbc 1/8, stable  - mobilize, pulm toilet - WOC following for new ostomy - overall doing well with it - stable for Dc today from surgical standpoint. Will arrange follow up   FEN: [place]  ID: zosyn 12/31>1/5; Zosyn 1/6>1/10; unasyn 1/10>>1/17 VTE: LMWH   - below per TRH -  Chronic diastolic HF Hypothyroidism Pulmonary fibrosis HTN    LOS: 23 days    Winferd Humphrey, [facility] 04/21/2022, 9:58 AM Please see Amion for pager number during day hours 7:00am-4:30pm

## 2022-04-21 NOTE — Progress Notes (Signed)
Physical Therapy Treatment Patient Details Name: Alexandria [name] MRN: [id number] DOB: [birth year] Today's Date: 04/21/2022   History of Present Illness 77 y/o female presented with continued constipation and diffuse abdominal pain, worst in the LUQ. Now s/p robotic lower anterior rectosigmoid resection with colostomy and drainage of pelvic abscess 03/31/22.  Plan for transgluteal pelvic drain placement on 04/12/22.  PHMx:hypothyroidism, grade I DD, HTN, IPF, diverticulosis, diverticulitis, DVT, chronic hypokalemia    PT Comments    Pt received supine in bed with RN in room providing medication, no complaints or pain report and agreeable to mobilize. Pt demonstrated modified independence for all mobility tasks today and was safe with 4WRW during mobility. All education has been completed and the patient has no further questions. Pt confirms she has all necessary equipment and is ready to go home in the care of her daughter. See below for any follow-up Physical Therapy or equipment needs. PT is signing off. Thank you for this referral.    Recommendations for follow up therapy are one component of a multi-disciplinary discharge planning process, led by the attending physician.  Recommendations may be updated based on patient status, additional functional criteria and insurance authorization.  Follow Up Recommendations  Home health PT Can patient physically be transported by private vehicle: Yes   Assistance Recommended at Discharge Intermittent Supervision/Assistance  Patient can return home with the following A little help with bathing/dressing/bathroom;Assistance with cooking/housework;Assist for transportation;Help with stairs or ramp for entrance   Equipment Recommendations  Rollator (4 wheels)    Recommendations for Other Services       Precautions / Restrictions Precautions Precautions: Other (comment) Precaution Comments: colcotomy, JP drain on R side; bloody rectal leakage-use  pads Restrictions Weight Bearing Restrictions: No     Mobility  Bed Mobility Overal bed mobility: Modified Independent Bed Mobility: Supine to Sit Rolling: Modified independent (Device/Increase time) Sidelying to sit: Modified independent (Device/Increase time) Supine to sit: Modified independent (Device/Increase time)     General bed mobility comments: Mod I increased time    Transfers Overall transfer level: Needs assistance Equipment used: Rollator (4 wheels) Transfers: Sit to/from Stand Sit to Stand: Modified independent (Device/Increase time)           General transfer comment: Mod I for incraesed time and use of 4WRW, one minor posterior LOB where pt sat back down on bed after first attempt at sit to stand but able to self-correct    Ambulation/Gait Ambulation/Gait assistance: Modified independent (Device/Increase time) Gait Distance (Feet): 500 Feet Assistive device: Rollator (4 wheels) Gait Pattern/deviations: Step-through pattern, Narrow base of support Gait velocity: decr     General Gait Details: Pt ambulated with 4WRW and shoes, modified independent no physical assist required or overt LOB noted. 1 seated rest break ~60s after walking 17f.   Stairs             Wheelchair Mobility    Modified Rankin (Stroke Patients Only)       Balance Overall balance assessment: Mild deficits observed, not formally tested   Sitting balance-Leahy Scale: Good       Standing balance-Leahy Scale: Fair                              Cognition Arousal/Alertness: Awake/alert Behavior During Therapy: WFL for tasks assessed/performed Overall Cognitive Status: Within Functional Limits for tasks assessed  Exercises      General Comments        Pertinent Vitals/Pain Pain Assessment Pain Assessment: Faces Faces Pain Scale: Hurts a little bit Breathing: normal Pain Location: abdomen  and R hip Pain Descriptors / Indicators: Sore Pain Intervention(s): Limited activity within patient's tolerance, Monitored during session, Repositioned, Premedicated before session    Home Living                          Prior Function            PT Goals (current goals can now be found in the care plan section) Acute Rehab PT Goals Patient Stated Goal: return to walking 2.5 miles/day PT Goal Formulation: With patient Time For Goal Achievement: 04/23/22 Potential to Achieve Goals: Good Progress towards PT goals: Goals met/education completed, patient discharged from PT    Frequency    Min 3X/week      PT Plan Current plan remains appropriate    Co-evaluation              AM-PAC PT "6 Clicks" Mobility   Outcome Measure  Help needed turning from your back to your side while in a flat bed without using bedrails?: None Help needed moving from lying on your back to sitting on the side of a flat bed without using bedrails?: None Help needed moving to and from a bed to a chair (including a wheelchair)?: A Little Help needed standing up from a chair using your arms (e.g., wheelchair or bedside chair)?: A Little Help needed to walk in hospital room?: A Little Help needed climbing 3-5 steps with a railing? : A Lot 6 Click Score: 19    End of Session   Activity Tolerance: Patient tolerated treatment well;No increased pain Patient left: in chair;with call bell/phone within reach;with chair alarm set Nurse Communication: Mobility status PT Visit Diagnosis: Difficulty in walking, not elsewhere classified (R26.2)     Time: 1173-5670 PT Time Calculation (min) (ACUTE ONLY): 16 min  Charges:  $Gait Training: 8-22 mins                     Coolidge Breeze, PT, DPT [facility] Department Office: [phone] Weekend pager: [phone]    Coolidge Breeze 04/21/2022, 10:54 AM

## 2022-04-22 ENCOUNTER — Other Ambulatory Visit (HOSPITAL_COMMUNITY): Payer: Self-pay | Admitting: Surgery

## 2022-04-22 DIAGNOSIS — K651 Peritoneal abscess: Secondary | ICD-10-CM

## 2022-04-23 ENCOUNTER — Other Ambulatory Visit (HOSPITAL_COMMUNITY): Payer: Self-pay

## 2022-04-23 DIAGNOSIS — Z7982 Long term (current) use of aspirin: Secondary | ICD-10-CM | POA: Diagnosis not present

## 2022-04-23 DIAGNOSIS — I080 Rheumatic disorders of both mitral and aortic valves: Secondary | ICD-10-CM | POA: Diagnosis not present

## 2022-04-23 DIAGNOSIS — I110 Hypertensive heart disease with heart failure: Secondary | ICD-10-CM | POA: Diagnosis not present

## 2022-04-23 DIAGNOSIS — I700 Atherosclerosis of aorta: Secondary | ICD-10-CM | POA: Diagnosis not present

## 2022-04-23 DIAGNOSIS — J84112 Idiopathic pulmonary fibrosis: Secondary | ICD-10-CM | POA: Diagnosis not present

## 2022-04-23 DIAGNOSIS — E039 Hypothyroidism, unspecified: Secondary | ICD-10-CM | POA: Diagnosis not present

## 2022-04-23 DIAGNOSIS — I2510 Atherosclerotic heart disease of native coronary artery without angina pectoris: Secondary | ICD-10-CM | POA: Diagnosis not present

## 2022-04-23 DIAGNOSIS — I872 Venous insufficiency (chronic) (peripheral): Secondary | ICD-10-CM | POA: Diagnosis not present

## 2022-04-23 DIAGNOSIS — Z8673 Personal history of transient ischemic attack (TIA), and cerebral infarction without residual deficits: Secondary | ICD-10-CM | POA: Diagnosis not present

## 2022-04-23 DIAGNOSIS — D509 Iron deficiency anemia, unspecified: Secondary | ICD-10-CM | POA: Diagnosis not present

## 2022-04-23 DIAGNOSIS — E876 Hypokalemia: Secondary | ICD-10-CM | POA: Diagnosis not present

## 2022-04-23 DIAGNOSIS — Z433 Encounter for attention to colostomy: Secondary | ICD-10-CM | POA: Diagnosis not present

## 2022-04-23 DIAGNOSIS — Z48815 Encounter for surgical aftercare following surgery on the digestive system: Secondary | ICD-10-CM | POA: Diagnosis not present

## 2022-04-23 DIAGNOSIS — J4520 Mild intermittent asthma, uncomplicated: Secondary | ICD-10-CM | POA: Diagnosis not present

## 2022-04-23 DIAGNOSIS — F418 Other specified anxiety disorders: Secondary | ICD-10-CM | POA: Diagnosis not present

## 2022-04-23 DIAGNOSIS — I5032 Chronic diastolic (congestive) heart failure: Secondary | ICD-10-CM | POA: Diagnosis not present

## 2022-04-24 ENCOUNTER — Telehealth: Payer: Self-pay | Admitting: Family Medicine

## 2022-04-24 DIAGNOSIS — I110 Hypertensive heart disease with heart failure: Secondary | ICD-10-CM | POA: Diagnosis not present

## 2022-04-24 DIAGNOSIS — J4520 Mild intermittent asthma, uncomplicated: Secondary | ICD-10-CM | POA: Diagnosis not present

## 2022-04-24 DIAGNOSIS — Z433 Encounter for attention to colostomy: Secondary | ICD-10-CM | POA: Diagnosis not present

## 2022-04-24 DIAGNOSIS — J84112 Idiopathic pulmonary fibrosis: Secondary | ICD-10-CM | POA: Diagnosis not present

## 2022-04-24 DIAGNOSIS — Z48815 Encounter for surgical aftercare following surgery on the digestive system: Secondary | ICD-10-CM | POA: Diagnosis not present

## 2022-04-24 DIAGNOSIS — I5032 Chronic diastolic (congestive) heart failure: Secondary | ICD-10-CM | POA: Diagnosis not present

## 2022-04-24 NOTE — Telephone Encounter (Signed)
[facility] just wanted pcp to know they completed to OT eval but pt did declined OT.

## 2022-04-24 NOTE — Telephone Encounter (Signed)
Pt called wanting to know if there was an antacid that Dr. Etter Sjogren would recommend for her other than OND antacid. Please Advise.

## 2022-04-24 NOTE — Telephone Encounter (Signed)
Blanch Media from [facility] stated pt was seen at the hospital and was wanting pcp to clarify some medication. She wanted to clarify if lasix is still PRN as she has swelling in legs and feet or go back to once daily. Also they discontinued potassium at the hospital and she wanted to see if pcp was okay with that. [phone] not a secure vm.

## 2022-04-24 NOTE — Telephone Encounter (Signed)
Blanch Media called back also wanting to make Dr. Etter Sjogren aware that the tramadol the pt is staking is ineffective.

## 2022-04-24 NOTE — Telephone Encounter (Signed)
FYI

## 2022-04-27 DIAGNOSIS — J84112 Idiopathic pulmonary fibrosis: Secondary | ICD-10-CM | POA: Diagnosis not present

## 2022-04-27 DIAGNOSIS — I5032 Chronic diastolic (congestive) heart failure: Secondary | ICD-10-CM | POA: Diagnosis not present

## 2022-04-27 DIAGNOSIS — I110 Hypertensive heart disease with heart failure: Secondary | ICD-10-CM | POA: Diagnosis not present

## 2022-04-27 DIAGNOSIS — Z48815 Encounter for surgical aftercare following surgery on the digestive system: Secondary | ICD-10-CM | POA: Diagnosis not present

## 2022-04-27 DIAGNOSIS — Z433 Encounter for attention to colostomy: Secondary | ICD-10-CM | POA: Diagnosis not present

## 2022-04-27 DIAGNOSIS — J4520 Mild intermittent asthma, uncomplicated: Secondary | ICD-10-CM | POA: Diagnosis not present

## 2022-04-28 ENCOUNTER — Emergency Department (HOSPITAL_COMMUNITY): Payer: Medicare Other

## 2022-04-28 ENCOUNTER — Other Ambulatory Visit: Payer: Self-pay

## 2022-04-28 ENCOUNTER — Emergency Department (HOSPITAL_COMMUNITY)
Admission: EM | Admit: 2022-04-28 | Discharge: 2022-04-28 | Disposition: A | Discharge status: Home / Self Care | Payer: Medicare Other | Attending: Emergency Medicine | Admitting: Emergency Medicine

## 2022-04-28 DIAGNOSIS — J45909 Unspecified asthma, uncomplicated: Secondary | ICD-10-CM | POA: Diagnosis not present

## 2022-04-28 DIAGNOSIS — Z79899 Other long term (current) drug therapy: Secondary | ICD-10-CM | POA: Insufficient documentation

## 2022-04-28 DIAGNOSIS — Z7982 Long term (current) use of aspirin: Secondary | ICD-10-CM | POA: Diagnosis not present

## 2022-04-28 DIAGNOSIS — Z7951 Long term (current) use of inhaled steroids: Secondary | ICD-10-CM | POA: Insufficient documentation

## 2022-04-28 DIAGNOSIS — I110 Hypertensive heart disease with heart failure: Secondary | ICD-10-CM | POA: Insufficient documentation

## 2022-04-28 DIAGNOSIS — K5900 Constipation, unspecified: Secondary | ICD-10-CM

## 2022-04-28 DIAGNOSIS — E039 Hypothyroidism, unspecified: Secondary | ICD-10-CM | POA: Insufficient documentation

## 2022-04-28 DIAGNOSIS — I5032 Chronic diastolic (congestive) heart failure: Secondary | ICD-10-CM | POA: Insufficient documentation

## 2022-04-28 DIAGNOSIS — R109 Unspecified abdominal pain: Secondary | ICD-10-CM | POA: Diagnosis not present

## 2022-04-28 LAB — COMPREHENSIVE METABOLIC PANEL
ALT: 24 U/L (ref 0–44)
AST: 38 U/L (ref 15–41)
Albumin: 3.3 g/dL — ABNORMAL LOW (ref 3.5–5.0)
Alkaline Phosphatase: 71 U/L (ref 38–126)
Anion gap: 14 (ref 5–15)
BUN: 8 mg/dL (ref 8–23)
CO2: 29 mmol/L (ref 22–32)
Calcium: 9.4 mg/dL (ref 8.9–10.3)
Chloride: 92 mmol/L — ABNORMAL LOW (ref 98–111)
Creatinine, Ser: 0.9 mg/dL (ref 0.44–1.00)
GFR, Estimated: >60 mL/min (ref >60)
Glucose, Bld: 118 mg/dL — ABNORMAL HIGH (ref 70–99)
Potassium: 2.8 mmol/L — ABNORMAL LOW (ref 3.5–5.1)
Sodium: 135 mmol/L (ref 135–145)
Total Bilirubin: 0.5 mg/dL (ref 0.3–1.2)
Total Protein: 7.9 g/dL (ref 6.5–8.1)

## 2022-04-28 LAB — URINALYSIS, ROUTINE W REFLEX MICROSCOPIC
Bilirubin Urine: NEGATIVE
Glucose, UA: NEGATIVE mg/dL
Hgb urine dipstick: NEGATIVE
Ketones, ur: 20 mg/dL — AB
Leukocytes,Ua: NEGATIVE
Nitrite: NEGATIVE
Protein, ur: NEGATIVE mg/dL
Specific Gravity, Urine: >1.046 — ABNORMAL HIGH (ref 1.005–1.030)
pH: 6 (ref 5.0–8.0)

## 2022-04-28 LAB — CBC WITH DIFFERENTIAL/PLATELET
Abs Immature Granulocytes: 0.04 10*3/uL (ref 0.00–0.07)
Basophils Absolute: 0.1 10*3/uL (ref 0.0–0.1)
Basophils Relative: 2 %
Eosinophils Absolute: 0.4 10*3/uL (ref 0.0–0.5)
Eosinophils Relative: 6 %
HCT: 40.3 % (ref 36.0–46.0)
Hemoglobin: 13.5 g/dL (ref 12.0–15.0)
Immature Granulocytes: 1 %
Lymphocytes Relative: 21 %
Lymphs Abs: 1.3 10*3/uL (ref 0.7–4.0)
MCH: 32.9 pg (ref 26.0–34.0)
MCHC: 33.5 g/dL (ref 30.0–36.0)
MCV: 98.3 fL (ref 80.0–100.0)
Monocytes Absolute: 0.8 10*3/uL (ref 0.1–1.0)
Monocytes Relative: 13 %
Neutro Abs: 3.5 10*3/uL (ref 1.7–7.7)
Neutrophils Relative %: 57 %
Platelets: 383 10*3/uL (ref 150–400)
RBC: 4.1 MIL/uL (ref 3.87–5.11)
RDW: 16.7 % — ABNORMAL HIGH (ref 11.5–15.5)
WBC: 6 10*3/uL (ref 4.0–10.5)
nRBC: 0 % (ref 0.0–0.2)

## 2022-04-28 LAB — LACTIC ACID, PLASMA: Lactic Acid, Venous: 0.9 mmol/L (ref 0.5–1.9)

## 2022-04-28 LAB — LIPASE, BLOOD: Lipase: 25 U/L (ref 11–51)

## 2022-04-28 MED ORDER — POTASSIUM CHLORIDE 10 MEQ/100ML IV SOLN
10.0000 meq | INTRAVENOUS | Status: AC
  Administered 2022-04-28 (×3): 10 meq via INTRAVENOUS
  Filled 2022-04-28 (×3): qty 100

## 2022-04-28 MED ORDER — POTASSIUM CHLORIDE CRYS ER 20 MEQ PO TBCR
20.0000 meq | EXTENDED_RELEASE_TABLET | Freq: Once | ORAL | Status: AC
  Administered 2022-04-28: 20 meq via ORAL
  Filled 2022-04-28: qty 1

## 2022-04-28 MED ORDER — FENTANYL CITRATE PF 50 MCG/ML IJ SOSY
12.5000 ug | PREFILLED_SYRINGE | Freq: Once | INTRAMUSCULAR | Status: AC
  Administered 2022-04-28: 12.5 ug via INTRAVENOUS
  Filled 2022-04-28: qty 1

## 2022-04-28 MED ORDER — IOHEXOL 350 MG/ML SOLN
75.0000 mL | Freq: Once | INTRAVENOUS | Status: AC | PRN
  Administered 2022-04-28: 75 mL via INTRAVENOUS

## 2022-04-28 NOTE — ED Provider Notes (Signed)
[facility] Provider Note   CSN: [id number] Arrival date & time: 04/28/22  0815     History  Chief Complaint  Patient presents with   ostomy drainage    Abdominal Pain    Alexandria [name] is a 77 y.o. female with chronic diastolic CHF, pulmonary fibrosis, HTN, hypokalemia, history of MI, hypothyroidism, diverticulosis, recently identified rectosigmoid mass, recent hospitalization for diverticulitis with abscess status post rectosigmoid resection with colostomy and drain placement by IR who presents with abdominal pain, decreased ostomy output.   Per chart review patient was recently admitted from 03/28/2022 to 04/21/2022 for diverticulitis of large intestine with perforation and abscess status post rectosigmoid resection on 03/31/2022.  She developed recurrent fever and worsening abdominal pain found to have new abscess formation intra-abdominal he with concern for leakage from the stump, underwent IR guided drainage of the abscess cultures grew E faecalis.  Also had postoperative ileus requiring temporary TPN and recurrent anemia requiring 2 units PRBCs transfusions.  ID recommended total of 6 weeks oral Augmentin from time of drain placement.  Today patient presents with her daughter who provides additional history.  Yesterday patient had home health nurse change her colostomy bag yesterday and she was noted to have moisture in the bag which there normally is not.  Since then she has not had any output from the ostomy bag.  She is concerned that the bag was placed incorrectly.  She has had generalized abdominal pain that started around the same time associated with mild nausea.  Denies any urinary symptoms.  Also has the drain placed, left posterior approach percutaneous pigtail drainage catheter that has been draining approximately 25 cc of purulent/serous fluid daily, daughter notes that today it is draining serosanguineous/purulent fluid which  is a bit abnormal but patient has had no pain in that area, no swelling or erythema.  She denies any fever/chills, chest pain, shortness of breath, cough, lower extremity edema.   Abdominal Pain      Home Medications Prior to Admission medications   Medication Sig Start Date End Date Taking? Authorizing Provider  AMBULATORY NON FORMULARY MEDICATION Medication Name: Diltiazem 2% with Lidocaine 5%- use index finger, apply small amount of medication inside the rectum up to your first knuckle/joint for 8 weeks Patient not taking: Reported on 03/29/2022 03/20/22   Sharyn Creamer, MD  amoxicillin-clavulanate (AUGMENTIN) 875-125 MG tablet Take 1 tablet by mouth 2 (two) times daily. 04/21/22 05/24/22  Vernelle Emerald, MD  aspirin EC 81 MG tablet Take 81 mg by mouth daily. Swallow whole.    [provider]  benzonatate (TESSALON) 100 MG capsule Take 200 mg by mouth as needed for cough. 03/06/22   [provider]  furosemide (LASIX) 40 MG tablet Take 1 tablet (40 mg total) by mouth daily as needed (if weight increases by more than 2 pounds in 1 day or 5 pounds in 1 week.). 04/21/22   Shalhoub, Sherryll Burger, MD  linaclotide [facility]) 145 MCG CAPS capsule Take 1 capsule (145 mcg total) by mouth daily before breakfast. 03/17/22   Sharyn Creamer, MD  lisinopril (ZESTRIL) 20 MG tablet Take 1 tablet (20 mg total) by mouth daily. 04/22/22   Shalhoub, Sherryll Burger, MD  ondansetron (ZOFRAN-ODT) 4 MG disintegrating tablet TAKE 1 TABLET BY MOUTH EVERY 8 HOURS AS NEEDED FOR NAUSEA AND VOMITING Patient taking differently: Take 4 mg by mouth as needed for nausea or vomiting. 07/18/21   Carollee Herter, Alferd Apa,  DO  SYNTHROID 112 MCG tablet Take 1 tablet (112 mcg total) by mouth daily before breakfast. 03/02/22   Carollee Herter, Alferd Apa, DO  vitamin C (ASCORBIC ACID) 500 MG tablet Take 500 mg by mouth daily.    [provider]      Allergies    Prednisone, Dilaudid [hydromorphone], Atrovent hfa  [ipratropium bromide hfa], Cheratussin ac [guaifenesin-codeine], Diltiazem, Hydrocodone, Influenza vaccines, Morphine, Motrin [ibuprofen], Oxycodone, and Codeine    Review of Systems   Review of Systems  Gastrointestinal:  Positive for abdominal pain.   Review of systems Negative for f/c.  A 10 point review of systems was performed and is negative unless otherwise reported in HPI.  Physical Exam Updated Vital Signs BP (!) 161/68   Pulse 69   Temp 98.4 F (36.9 C) (Oral)   Resp 18   Ht '5\' 4"'$  (1.626 m)   Wt 65.8 kg   SpO2 99%   BMI 24.89 kg/m  Physical Exam General: Normal appearing female, lying in bed.  HEENT: Sclera anicteric, MMM, trachea midline.  Cardiology: RRR, no murmurs/rubs/gallops. BL radial and DP pulses equal bilaterally.  Resp: Normal respiratory rate and effort. CTAB, no wheezes, rhonchi, crackles.  Abd: Soft, Diffuse abdominal tenderness to palpation worst around the ostomy. mildly distended. Ostomy with small amount of stool noted in the stoma but no stool in the bag. No rebound tenderness or guarding.  GU: Deferred. MSK: No peripheral edema or signs of trauma. Extremities without deformity or TTP. No cyanosis or clubbing. Skin: warm, dry. No rashes or lesions. Back: No CVA tenderness Neuro: A&Ox4, CNs II-XII grossly intact. MAEs. Sensation grossly intact.  Psych: Normal mood and affect.   ED Results / Procedures / Treatments   Labs (all labs ordered are listed, but only abnormal results are displayed) Labs Reviewed  URINALYSIS, ROUTINE W REFLEX MICROSCOPIC - Abnormal; Notable for the following components:      Result Value   APPearance HAZY (*)    Specific Gravity, Urine >1.046 (*)    Ketones, ur 20 (*)    All other components within normal limits  COMPREHENSIVE METABOLIC PANEL - Abnormal; Notable for the following components:   Potassium 2.8 (*)    Chloride 92 (*)    Glucose, Bld 118 (*)    Albumin 3.3 (*)    All other components within normal limits   CBC WITH DIFFERENTIAL/PLATELET - Abnormal; Notable for the following components:   RDW 16.7 (*)    All other components within normal limits  LIPASE, BLOOD  LACTIC ACID, PLASMA    EKG None  Radiology CT ABDOMEN PELVIS W CONTRAST  Result Date: 04/28/2022 CLINICAL DATA:  Abdominal pain. Previous colostomy and pelvic abscess. EXAM: CT ABDOMEN AND PELVIS WITH CONTRAST TECHNIQUE: Multidetector CT imaging of the abdomen and pelvis was performed using the standard protocol following bolus administration of intravenous contrast. RADIATION DOSE REDUCTION: This exam was performed according to the departmental dose-optimization program which includes automated exposure control, adjustment of the mA and/or kV according to patient size and/or use of iterative reconstruction technique. CONTRAST:  41m OMNIPAQUE IOHEXOL 350 MG/ML SOLN COMPARISON:  04/20/2022 FINDINGS: Lower Chest: No acute findings. Hepatobiliary: No hepatic masses identified. Gallbladder is unremarkable. No evidence of biliary ductal dilatation. Pancreas:  No mass or inflammatory changes. Spleen: Within normal limits in size and appearance. Adrenals/Urinary Tract: No suspicious masses identified. No evidence of ureteral calculi or hydronephrosis. Stomach/Bowel: Prior sigmoid colon resection and colostomy are again seen. No evidence of bowel obstruction.  Left transgluteal percutaneous drainage catheter remains appropriate position. Fluid and gas collection in the presacral space currently measures 3.2 x 2.8 cm, compared to 5.1 x 4.2 cm on prior study. Vascular/Lymphatic: No pathologically enlarged lymph nodes. No acute vascular findings. Reproductive:  No mass or other significant abnormality. Other:  None. Musculoskeletal:  No suspicious bone lesions identified. IMPRESSION: No evidence of bowel obstruction or other acute findings. Mild decrease in size of presacral collection, with percutaneous drainage catheter in appropriate position.  Electronically Signed   By: Marlaine Hind M.D.   On: 04/28/2022 15:08    Procedures Procedures    Medications Ordered in ED Medications  potassium chloride 10 mEq in 100 mL IVPB (10 mEq Intravenous Not Given 04/28/22 1654)  fentaNYL (SUBLIMAZE) injection 12.5 mcg (12.5 mcg Intravenous Given 04/28/22 1247)  iohexol (OMNIPAQUE) 350 MG/ML injection 75 mL (75 mLs Intravenous Contrast Given 04/28/22 1455)  potassium chloride SA (KLOR-CON M) CR tablet 20 mEq (20 mEq Oral Given 04/28/22 1638)    ED Course/ Medical Decision Making/ A&P                          Medical Decision Making Amount and/or Complexity of Data Reviewed Labs: ordered. Decision-making details documented in ED Course. Radiology: ordered. Decision-making details documented in ED Course.  Risk Prescription drug management.    This patient presents to the ED for concern of abdominal pain, decreased ostomy output, this involves an extensive number of treatment options, and is a complaint that carries with it a high risk of complications and morbidity.  I considered the following differential and admission for this acute, potentially life threatening condition.   MDM:    Greatest concern for small bowel obstruction causing decreased ostomy output, also consider ileus given the patient did have postoperative ileus complication during hospitalization.  Patient and her daughter are concerned that the bag was placed incorrectly but I do not believe this plays any role in her symptoms currently.  Consider worsening of intra-abdominal infection/abscess that the percutaneous drain has been continuing to drain appropriately though now it is slightly serosanguineous when it had previously just been serous/purulent.  Also consider other cause abdominal pain such as recurrent diverticulitis, pancreatitis, mesenteric ischemia.  Do not believe symptoms are consistent with appendicitis, aortic dissection, UTI/pyelonephritis/nephrolithiasis.  Will  evaluate with CT.  Clinical Course as of 04/28/22 2318  Tue Apr 28, 2022  1109 Potassium(!): 2.8 Will replete IV as patient is NPO for now [HN]  1109 Lipase: 25 [HN]  1109 WBC: 6.0 No leukocytosis [HN]  1109 Hemoglobin: 13.5 [HN]  1420 Lactic Acid, Venous: 0.9 [HN]  1448 Patient still awaiting CT scan. RN called CT and patient is in the queue. [HN]  1538 CT ABDOMEN PELVIS W CONTRAST No evidence of bowel obstruction or other acute findings.  Mild decrease in size of presacral collection, with percutaneous drainage catheter in appropriate position.   [HN]  1610 Patient's workup very reassuring.  No bowel obstruction demonstrated on CT of the pelvis.  Patient likely with constipation.  Advised to start 1 Full of MiraLAX per day and can increase to 2 for adequate stool output.  Also advised to stay well-hydrated which will help.  Patient is instructed that moisture in the ostomy bag is okay and likely the application of the ostomy bag did not have anything to do with her decreased stool output. Daughter went  home to get a bag and bedside RN changed the ostomy  bag here without any moisture in the bag. She has follow up with her surgeon in 6 days. Given DC instructions/return precautions, all questions answered to patient's satisfaction. [HN]    Clinical Course User Index [HN] Audley Hose, MD    Labs: I Ordered, and personally interpreted labs.  The pertinent results include:  those listed above  Imaging Studies ordered: I ordered imaging studies including CT AP I independently visualized and interpreted imaging. I agree with the radiologist interpretation  Additional history obtained from daughter, chart review.   Cardiac Monitoring: The patient was maintained on a cardiac monitor.  I personally viewed and interpreted the cardiac monitored which showed an underlying rhythm of: NSR  Reevaluation: After the interventions noted above, I reevaluated the patient and found that they  have :improved  Social Determinants of Health: Patient lives independently  Disposition:  DC w/ miralax, discharge instructions, return precautions, surgery f/u next week  Co morbidities that complicate the patient evaluation  Past Medical History:  Diagnosis Date   Arthritis    Asthmatic bronchitis with acute exacerbation 02/06/2015   Bronchial pneumonia    Colon polyp    Diverticulitis    DVT (deep venous thrombosis) ([place])    lower extremity   H/O cardiac catheterization 2004   Normal coronary arteries   Heart murmur    History of stress test 05/21/2011   Hx of echocardiogram 01/27/2010   Normal Ef 55% the transmitral spectral doppler flow pattern is normal for age. the left ventricular wall motion is normal   IPF (idiopathic pulmonary fibrosis) ([place]) 01/2018   Migraines    Pancreatitis    Thyroid disease    TIA (transient ischemic attack)    8 years ago     Medicines Meds ordered this encounter  Medications   potassium chloride 10 mEq in 100 mL IVPB   fentaNYL (SUBLIMAZE) injection 12.5 mcg   iohexol (OMNIPAQUE) 350 MG/ML injection 75 mL   potassium chloride SA (KLOR-CON M) CR tablet 20 mEq    I have reviewed the patients home medicines and have made adjustments as needed  Problem List / ED Course: Problem List Items Addressed This Visit   None Visit Diagnoses     Constipation, unspecified constipation type    -  Primary                   This note was created using dictation software, which may contain spelling or grammatical errors.    Audley Hose, MD 04/28/22 [phone]

## 2022-04-28 NOTE — ED Provider Triage Note (Signed)
Emergency Medicine Provider Triage Evaluation Note  Alexandria [name] , a 77 y.o. female  was evaluated in triage.  Pt complains of colostomy bag issue.  Had the bag placed in February.  States yesterday at 2 PM the nurse applied a new bag and she believes that it was applied long as there has been steam and suction in the bed which is not supposed to happen.  SANE nurse came back at 7 PM yesterday to replace it again.  Patient has still not had any fecal matter in the colostomy bag and over 12 hours now.  She also called her surgeon's office, but they do not open until 9 and she has not heard back yet.  Does not want to develop uremia.  Complains of minimal generalized abdominal pain.  No fevers, nausea or vomiting  Review of Systems  Positive: As above Negative: As above  Physical Exam  BP 95/84 (BP Location: Right Arm)   Pulse 89   Temp 98.4 F (36.9 C)   Resp 16   SpO2 95%  Gen:   Awake, no distress   Resp:  Normal effort  MSK:   Moves extremities without difficulty  Other:  Colostomy bag without fecal matter.  There is slight condensation noted  Medical Decision Making  Medically screening exam initiated at 9:15 AM.  Appropriate orders placed.  Alexandria [name] was informed that the remainder of the evaluation will be completed by another provider, this initial triage assessment does not replace that evaluation, and the importance of remaining in the ED until their evaluation is complete.     Roylene Reason, [place] 04/28/22 [phone]

## 2022-04-28 NOTE — ED Provider Notes (Incomplete)
[facility] Provider Note   CSN: [id number] Arrival date & time: 04/28/22  4627     History {Add pertinent medical, surgical, social history, OB history to HPI:1} Chief Complaint  Patient presents with   ostomy drainage    Abdominal Pain    Alexandria [name] is a 77 y.o. female with *** presents with ***.   she had a repair of a twisted bowel and polys removed December 26. My ostomy bag is not working right , I only have moisture in there.    Pt complains of colostomy bag issue.  Had the bag placed in February.  States yesterday at 2 PM the nurse applied a new bag and she believes that it was applied long as there has been steam and suction in the bed which is not supposed to happen.  SANE nurse came back at 7 PM yesterday to replace it again.  Patient has still not had any fecal matter in the colostomy bag and over 12 hours now.  She also called her surgeon's office, but they do not open until 9 and she has not heard back yet.  Does not want to develop uremia.  Complains of minimal generalized abdominal pain.  No fevers, nausea or vomiting     Abdominal Pain      Home Medications Prior to Admission medications   Medication Sig Start Date End Date Taking? Authorizing Provider  AMBULATORY NON FORMULARY MEDICATION Medication Name: Diltiazem 2% with Lidocaine 5%- use index finger, apply small amount of medication inside the rectum up to your first knuckle/joint for 8 weeks Patient not taking: Reported on 03/29/2022 03/20/22   Sharyn Creamer, MD  amoxicillin-clavulanate (AUGMENTIN) 875-125 MG tablet Take 1 tablet by mouth 2 (two) times daily. 04/21/22 05/24/22  Vernelle Emerald, MD  aspirin EC 81 MG tablet Take 81 mg by mouth daily. Swallow whole.    [provider]  benzonatate (TESSALON) 100 MG capsule Take 200 mg by mouth as needed for cough. 03/06/22   [provider]  furosemide (LASIX) 40 MG tablet Take 1 tablet  (40 mg total) by mouth daily as needed (if weight increases by more than 2 pounds in 1 day or 5 pounds in 1 week.). 04/21/22   Shalhoub, Sherryll Burger, MD  linaclotide [facility]) 145 MCG CAPS capsule Take 1 capsule (145 mcg total) by mouth daily before breakfast. 03/17/22   Sharyn Creamer, MD  lisinopril (ZESTRIL) 20 MG tablet Take 1 tablet (20 mg total) by mouth daily. 04/22/22   Shalhoub, Sherryll Burger, MD  ondansetron (ZOFRAN-ODT) 4 MG disintegrating tablet TAKE 1 TABLET BY MOUTH EVERY 8 HOURS AS NEEDED FOR NAUSEA AND VOMITING Patient taking differently: Take 4 mg by mouth as needed for nausea or vomiting. 07/18/21   Carollee Herter, Alferd Apa, DO  SYNTHROID 112 MCG tablet Take 1 tablet (112 mcg total) by mouth daily before breakfast. 03/02/22   Carollee Herter, Alferd Apa, DO  vitamin C (ASCORBIC ACID) 500 MG tablet Take 500 mg by mouth daily.    [provider]      Allergies    Prednisone, Dilaudid [hydromorphone], Atrovent hfa [ipratropium bromide hfa], Cheratussin ac [guaifenesin-codeine], Diltiazem, Hydrocodone, Influenza vaccines, Morphine, Motrin [ibuprofen], Oxycodone, and Codeine    Review of Systems   Review of Systems  Gastrointestinal:  Positive for abdominal pain.   Review of systems {pos/neg:18640::"Negative","Positive"} for ***.  A 10 point review of systems was performed and is negative unless  otherwise reported in HPI.  Physical Exam Updated Vital Signs BP (!) 133/54   Pulse 78   Temp 98.4 F (36.9 C)   Resp 19   Ht '5\' 4"'$  (1.626 m)   Wt 65.8 kg   SpO2 96%   BMI 24.89 kg/m  Physical Exam General: Normal appearing {Desc; female/female:11659}, lying in bed.  HEENT: PERRLA, Sclera anicteric, MMM, trachea midline.  Cardiology: RRR, no murmurs/rubs/gallops. BL radial and DP pulses equal bilaterally.  Resp: Normal respiratory rate and effort. CTAB, no wheezes, rhonchi, crackles.  Abd: Soft, non-tender, non-distended. No rebound tenderness or guarding.  GU: Deferred. MSK: No  peripheral edema or signs of trauma. Extremities without deformity or TTP. No cyanosis or clubbing. Skin: warm, dry. No rashes or lesions. Back: No CVA tenderness Neuro: A&Ox4, CNs II-XII grossly intact. MAEs. Sensation grossly intact.  Psych: Normal mood and affect.   ED Results / Procedures / Treatments   Labs (all labs ordered are listed, but only abnormal results are displayed) Labs Reviewed  COMPREHENSIVE METABOLIC PANEL - Abnormal; Notable for the following components:      Result Value   Potassium 2.8 (*)    Chloride 92 (*)    Glucose, Bld 118 (*)    Albumin 3.3 (*)    All other components within normal limits  CBC WITH DIFFERENTIAL/PLATELET - Abnormal; Notable for the following components:   RDW 16.7 (*)    All other components within normal limits  LIPASE, BLOOD  URINALYSIS, ROUTINE W REFLEX MICROSCOPIC    EKG None  Radiology No results found.  Procedures Procedures  {Document cardiac monitor, telemetry assessment procedure when appropriate:1}  Medications Ordered in ED Medications - No data to display  ED Course/ Medical Decision Making/ A&P                          Medical Decision Making   This patient presents to the ED for concern of ***, this involves an extensive number of treatment options, and is a complaint that carries with it a high risk of complications and morbidity.  I considered the following differential and admission for this acute, potentially life threatening condition.   MDM:    ***     Labs: I Ordered, and personally interpreted labs.  The pertinent results include:  ***  Imaging Studies ordered: I ordered imaging studies including *** I independently visualized and interpreted imaging. I agree with the radiologist interpretation  Additional history obtained from ***.  External records from outside source obtained and reviewed including ***  Cardiac Monitoring: The patient was maintained on a cardiac monitor.  I personally  viewed and interpreted the cardiac monitored which showed an underlying rhythm of: ***  Reevaluation: After the interventions noted above, I reevaluated the patient and found that they have :{resolved/improved/worsened:23923::"improved"}  Social Determinants of Health: ***  Disposition:  ***  Co morbidities that complicate the patient evaluation  Past Medical History:  Diagnosis Date   Arthritis    Asthmatic bronchitis with acute exacerbation 02/06/2015   Bronchial pneumonia    Colon polyp    Diverticulitis    DVT (deep venous thrombosis) ([place])    lower extremity   H/O cardiac catheterization 2004   Normal coronary arteries   Heart murmur    History of stress test 05/21/2011   Hx of echocardiogram 01/27/2010   Normal Ef 55% the transmitral spectral doppler flow pattern is normal for age. the left ventricular wall motion is normal  IPF (idiopathic pulmonary fibrosis) ([place]) 01/2018   Migraines    Pancreatitis    Thyroid disease    TIA (transient ischemic attack)    8 years ago     Medicines No orders of the defined types were placed in this encounter.   I have reviewed the patients home medicines and have made adjustments as needed  Problem List / ED Course: Problem List Items Addressed This Visit   None        {Document critical care time when appropriate:1} {Document review of labs and clinical decision tools ie heart score, Chads2Vasc2 etc:1}  {Document your independent review of radiology images, and any outside records:1} {Document your discussion with family members, caretakers, and with consultants:1} {Document social determinants of health affecting pt's care:1} {Document your decision making why or why not admission, treatments were needed:1}  This note was created using dictation software, which may contain spelling or grammatical errors.

## 2022-04-28 NOTE — Discharge Instructions (Addendum)
Thank you for coming to [facility] Emergency Department. You were seen for abdominal pain, decreased ostomy output. We did an exam, labs, and imaging, and these showed no acute findings. You are likely constipated. Please take miralax 1-2 capfuls per day in order to soften the stool. Also make sure you stay well hydrated which will help.  Please follow up with your primary care provider within 1 week.   Do not hesitate to return to the ED or call 911 if you experience: -Worsening symptoms -Worsening abdominal pain -Nausea/vomiting so severe you cannot eat or drink anything -Lightheadedness, passing out -Fevers/chills -Anything else that concerns you

## 2022-04-28 NOTE — ED Notes (Signed)
Changed pt's ostomy pouch using supplies provided from pt's home. Provided education and pt verbalized understanding of information reviewed. Wheelchair to exit.

## 2022-04-28 NOTE — ED Triage Notes (Signed)
Pt/daughter stated, she had a repair of a twisted bowel and polys removed December 26. My ostomy bag is not working right , I only have moisture in there.

## 2022-04-29 DIAGNOSIS — J4520 Mild intermittent asthma, uncomplicated: Secondary | ICD-10-CM | POA: Diagnosis not present

## 2022-04-29 DIAGNOSIS — I5032 Chronic diastolic (congestive) heart failure: Secondary | ICD-10-CM | POA: Diagnosis not present

## 2022-04-29 DIAGNOSIS — Z433 Encounter for attention to colostomy: Secondary | ICD-10-CM | POA: Diagnosis not present

## 2022-04-29 DIAGNOSIS — I110 Hypertensive heart disease with heart failure: Secondary | ICD-10-CM | POA: Diagnosis not present

## 2022-04-29 DIAGNOSIS — Z48815 Encounter for surgical aftercare following surgery on the digestive system: Secondary | ICD-10-CM | POA: Diagnosis not present

## 2022-04-29 DIAGNOSIS — J84112 Idiopathic pulmonary fibrosis: Secondary | ICD-10-CM | POA: Diagnosis not present

## 2022-04-29 NOTE — Telephone Encounter (Signed)
Spoke with [facility] [employer]. She is needing clarification. Lasix is on the med list but the potassium was discontinued at the hospital. How much of the lasix should she be taking and she needs a new rx for the potassium.

## 2022-04-30 ENCOUNTER — Encounter (HOSPITAL_COMMUNITY): Payer: Self-pay

## 2022-04-30 ENCOUNTER — Other Ambulatory Visit: Payer: Self-pay | Admitting: Family Medicine

## 2022-04-30 ENCOUNTER — Ambulatory Visit (HOSPITAL_COMMUNITY): Admit: 2022-04-30 | Payer: Medicare Other | Admitting: Gastroenterology

## 2022-04-30 DIAGNOSIS — E876 Hypokalemia: Secondary | ICD-10-CM

## 2022-04-30 SURGERY — ULTRASOUND, LOWER GI TRACT, ENDOSCOPIC
Anesthesia: Monitor Anesthesia Care

## 2022-04-30 MED ORDER — POTASSIUM CHLORIDE CRYS ER 20 MEQ PO TBCR: EXTENDED_RELEASE_TABLET | ORAL | 2 refills | Status: DC

## 2022-05-01 ENCOUNTER — Ambulatory Visit (HOSPITAL_COMMUNITY)
Admit: 2022-05-01 | Discharge: 2022-05-01 | Disposition: A | Discharge status: Home / Self Care | Payer: Medicare Other | Attending: Nurse Practitioner | Admitting: Nurse Practitioner

## 2022-05-01 DIAGNOSIS — J84112 Idiopathic pulmonary fibrosis: Secondary | ICD-10-CM | POA: Diagnosis not present

## 2022-05-01 DIAGNOSIS — K9409 Other complications of colostomy: Secondary | ICD-10-CM | POA: Insufficient documentation

## 2022-05-01 DIAGNOSIS — K9400 Colostomy complication, unspecified: Secondary | ICD-10-CM | POA: Diagnosis not present

## 2022-05-01 DIAGNOSIS — K5780 Diverticulitis of intestine, part unspecified, with perforation and abscess without bleeding: Secondary | ICD-10-CM | POA: Diagnosis not present

## 2022-05-01 DIAGNOSIS — Z933 Colostomy status: Secondary | ICD-10-CM | POA: Diagnosis present

## 2022-05-01 DIAGNOSIS — Z8673 Personal history of transient ischemic attack (TIA), and cerebral infarction without residual deficits: Secondary | ICD-10-CM | POA: Insufficient documentation

## 2022-05-01 DIAGNOSIS — L24B3 Irritant contact dermatitis related to fecal or urinary stoma or fistula: Secondary | ICD-10-CM

## 2022-05-01 DIAGNOSIS — Z79899 Other long term (current) drug therapy: Secondary | ICD-10-CM | POA: Diagnosis not present

## 2022-05-01 DIAGNOSIS — Z86718 Personal history of other venous thrombosis and embolism: Secondary | ICD-10-CM | POA: Diagnosis not present

## 2022-05-01 DIAGNOSIS — J45909 Unspecified asthma, uncomplicated: Secondary | ICD-10-CM | POA: Insufficient documentation

## 2022-05-01 DIAGNOSIS — Z8719 Personal history of other diseases of the digestive system: Secondary | ICD-10-CM | POA: Insufficient documentation

## 2022-05-01 DIAGNOSIS — K5900 Constipation, unspecified: Secondary | ICD-10-CM | POA: Insufficient documentation

## 2022-05-01 DIAGNOSIS — E079 Disorder of thyroid, unspecified: Secondary | ICD-10-CM | POA: Diagnosis not present

## 2022-05-01 DIAGNOSIS — K5901 Slow transit constipation: Secondary | ICD-10-CM

## 2022-05-01 DIAGNOSIS — Z7982 Long term (current) use of aspirin: Secondary | ICD-10-CM | POA: Insufficient documentation

## 2022-05-01 DIAGNOSIS — L539 Erythematous condition, unspecified: Secondary | ICD-10-CM | POA: Diagnosis not present

## 2022-05-01 NOTE — Progress Notes (Signed)
[facility]   Reason for visit:  LLQ colostomy HPI:  Diverticulitis with abscess History constipation Past Medical History:  Diagnosis Date   Arthritis    Asthmatic bronchitis with acute exacerbation 02/06/2015   Bronchial pneumonia    Colon polyp    Diverticulitis    DVT (deep venous thrombosis) (HCC)    lower extremity   H/O cardiac catheterization 2004   Normal coronary arteries   Heart murmur    History of stress test 05/21/2011   Hx of echocardiogram 01/27/2010   Normal Ef 55% the transmitral spectral doppler flow pattern is normal for age. the left ventricular wall motion is normal   IPF (idiopathic pulmonary fibrosis) ([place]) 01/2018   Migraines    Pancreatitis    Thyroid disease    TIA (transient ischemic attack)    8 years ago   Family History  Problem Relation Age of Onset   Ovarian cancer Mother    Uterine cancer Mother    Lung cancer Father    HIV Brother        98   Kidney cancer Brother    Lung cancer Brother    Other Brother        Mouth Cancer   Colon cancer Maternal Aunt    Heart disease Maternal Grandmother    Stroke Maternal Grandmother    Hypertension Maternal Grandmother    Diabetes Maternal Grandmother    Arthritis Other    Esophageal cancer Neg Hx    Liver disease Neg Hx    Rectal cancer Neg Hx    Stomach cancer Neg Hx    Allergies  Allergen Reactions   Prednisone Other (See Comments)    Changed personality    Dilaudid [Hydromorphone] Itching and Other (See Comments)    Dilaudid caused marked confusion   Atrovent Hfa [Ipratropium Bromide Hfa] Itching and Other (See Comments)    Pt could not sleep   Cheratussin Ac [Guaifenesin-Codeine] Other (See Comments)    Headaches    Diltiazem Itching and Other (See Comments)    Makes patient sick. Shakes. GI   Hydrocodone Itching   Influenza Vaccines Other (See Comments)    Pt reports heart attack after last flu shot   Morphine Itching   Motrin [Ibuprofen] Itching and Other  (See Comments)    "Gives false reading in blood"   Oxycodone Itching   Codeine Itching, Nausea Only and Other (See Comments)    Pt takes promethazine with codeine at home   Current Outpatient Medications  Medication Sig Dispense Refill Last Dose   AMBULATORY NON FORMULARY MEDICATION Medication Name: Diltiazem 2% with Lidocaine 5%- use index finger, apply small amount of medication inside the rectum up to your first knuckle/joint for 8 weeks (Patient not taking: Reported on 03/29/2022) 30 g 0    amoxicillin-clavulanate (AUGMENTIN) 875-125 MG tablet Take 1 tablet by mouth 2 (two) times daily. 66 tablet 0    aspirin EC 81 MG tablet Take 81 mg by mouth daily. Swallow whole.      benzonatate (TESSALON) 100 MG capsule Take 200 mg by mouth as needed for cough.      furosemide (LASIX) 40 MG tablet Take 1 tablet (40 mg total) by mouth daily as needed (if weight increases by more than 2 pounds in 1 day or 5 pounds in 1 week.). 180 tablet 1    linaclotide (LINZESS) 145 MCG CAPS capsule Take 1 capsule (145 mcg total) by mouth daily before breakfast. 30 capsule 3  lisinopril (ZESTRIL) 20 MG tablet Take 1 tablet (20 mg total) by mouth daily. 30 tablet 2    ondansetron (ZOFRAN-ODT) 4 MG disintegrating tablet TAKE 1 TABLET BY MOUTH EVERY 8 HOURS AS NEEDED FOR NAUSEA AND VOMITING (Patient taking differently: Take 4 mg by mouth as needed for nausea or vomiting.) 60 tablet 2    potassium chloride SA (KLOR-CON M20) 20 MEQ tablet 1 po qd 90 tablet 2    SYNTHROID 112 MCG tablet Take 1 tablet (112 mcg total) by mouth daily before breakfast. 90 tablet 0    vitamin C (ASCORBIC ACID) 500 MG tablet Take 500 mg by mouth daily.      No current facility-administered medications for this encounter.   ROS  Review of Systems  Gastrointestinal:        LLQ colostomy  Skin:  Positive for color change.       Peristomal erythema  Psychiatric/Behavioral: Negative.    All other systems reviewed and are negative.  Vital  signs:  There were no vitals taken for this visit. Exam:  Physical Exam Constitutional:      Appearance: Normal appearance.  Abdominal:     Palpations: Abdomen is soft.  Skin:    General: Skin is warm and dry.     Findings: Erythema present.  Neurological:     Mental Status: She is alert and oriented to person, place, and time.  Psychiatric:        Mood and Affect: Mood normal.        Behavior: Behavior normal.     Stoma type/location:  LLQ colostomy Stomal assessment/size:  1 1/4" round, only slightly budded Peristomal assessment:  erythema, intact  Treatment options for stomal/peristomal skin: stoma powder and skin prep  barrier ring 1 piece convex pouch  recommend belt and will order (needs medium) Output: soft brown stool Ostomy pouching: 1pc. Convex with barrier ring  Education provided:  recent visit to ED for condensation in pouch after [facility] visit.  Was concerned this was a negative finding.  Pouch changed in ED and released.  Stool has been very thick and has started miralax every third day.  Stool is softened, but remains thick and I recommend advancing to every other day. Thick sticky stool accumulates around pouch barrier and causes leaks    Impression/dx  Constipation  remains on narcotic analgesia, educated this could be contributing Colostomy Discussion  Add miralax   Discussed diet, chew food well, cut into small pieces Given sample pouches WIll enroll with [facility] Plan  See back in 2 weeks    Visit time: 55 minutes.   Domenic Moras FNP-BC

## 2022-05-02 DIAGNOSIS — K5901 Slow transit constipation: Secondary | ICD-10-CM | POA: Insufficient documentation

## 2022-05-02 DIAGNOSIS — K9400 Colostomy complication, unspecified: Secondary | ICD-10-CM | POA: Insufficient documentation

## 2022-05-02 DIAGNOSIS — L24B3 Irritant contact dermatitis related to fecal or urinary stoma or fistula: Secondary | ICD-10-CM | POA: Insufficient documentation

## 2022-05-02 NOTE — Discharge Instructions (Signed)
Will set up with [facility] 1 piece pouch [phone] Medium belt Stoma powder and skin prep

## 2022-05-04 ENCOUNTER — Telehealth: Payer: Self-pay | Admitting: Family Medicine

## 2022-05-04 ENCOUNTER — Other Ambulatory Visit (HOSPITAL_COMMUNITY): Payer: Self-pay | Admitting: Nurse Practitioner

## 2022-05-04 ENCOUNTER — Other Ambulatory Visit: Payer: Self-pay | Admitting: Family Medicine

## 2022-05-04 DIAGNOSIS — Z9049 Acquired absence of other specified parts of digestive tract: Secondary | ICD-10-CM | POA: Insufficient documentation

## 2022-05-04 DIAGNOSIS — G4700 Insomnia, unspecified: Secondary | ICD-10-CM

## 2022-05-04 DIAGNOSIS — Z432 Encounter for attention to ileostomy: Secondary | ICD-10-CM

## 2022-05-04 DIAGNOSIS — L24B3 Irritant contact dermatitis related to fecal or urinary stoma or fistula: Secondary | ICD-10-CM

## 2022-05-04 MED ORDER — TRAZODONE HCL 50 MG PO TABS: 25.0000 mg | ORAL_TABLET | Freq: Every evening | ORAL | 3 refills | Status: DC | PRN

## 2022-05-04 NOTE — Telephone Encounter (Signed)
Pt.notified

## 2022-05-04 NOTE — Telephone Encounter (Signed)
Patient called to advise that she would like something for sleep because she has a "tube coming out of her tush." Surgeon advised she would have to ask [place] for the medication. Please call to advise patient.

## 2022-05-04 NOTE — Telephone Encounter (Signed)
Pt notified of this and she knows what to do.

## 2022-05-05 ENCOUNTER — Ambulatory Visit (HOSPITAL_COMMUNITY): Payer: BC Managed Care – PPO | Admitting: Nurse Practitioner

## 2022-05-05 DIAGNOSIS — Z48815 Encounter for surgical aftercare following surgery on the digestive system: Secondary | ICD-10-CM | POA: Diagnosis not present

## 2022-05-05 DIAGNOSIS — I5032 Chronic diastolic (congestive) heart failure: Secondary | ICD-10-CM | POA: Diagnosis not present

## 2022-05-05 DIAGNOSIS — J4520 Mild intermittent asthma, uncomplicated: Secondary | ICD-10-CM | POA: Diagnosis not present

## 2022-05-05 DIAGNOSIS — I110 Hypertensive heart disease with heart failure: Secondary | ICD-10-CM | POA: Diagnosis not present

## 2022-05-05 DIAGNOSIS — J84112 Idiopathic pulmonary fibrosis: Secondary | ICD-10-CM | POA: Diagnosis not present

## 2022-05-05 DIAGNOSIS — Z433 Encounter for attention to colostomy: Secondary | ICD-10-CM | POA: Diagnosis not present

## 2022-05-06 ENCOUNTER — Ambulatory Visit
Admission: RE | Admit: 2022-05-06 | Discharge: 2022-05-06 | Disposition: A | Discharge status: Home / Self Care | Payer: Medicare Other | Source: Ambulatory Visit | Attending: Surgery | Admitting: Surgery

## 2022-05-06 ENCOUNTER — Ambulatory Visit
Admission: RE | Admit: 2022-05-06 | Discharge: 2022-05-06 | Disposition: A | Discharge status: Home / Self Care | Payer: Medicare Other | Source: Ambulatory Visit | Attending: Radiology | Admitting: Radiology

## 2022-05-06 DIAGNOSIS — K651 Peritoneal abscess: Secondary | ICD-10-CM

## 2022-05-06 MED ORDER — IOPAMIDOL (ISOVUE-300) INJECTION 61%
100.0000 mL | Freq: Once | INTRAVENOUS | Status: AC | PRN
  Administered 2022-05-06: 100 mL via INTRAVENOUS

## 2022-05-06 NOTE — Progress Notes (Signed)
Referring Physician(s): Gross,Steven  Chief Complaint: The patient is seen in follow up today s/p intra-abdominal abscess aspiration and drainage  History of present illness: MAUDEAN HOFFMANN is a 77 y.o. female history of hypothyroidism, diastolic dysfunction, HTN, idiopathic pulmonary fibrosis, diverticulosis, diverticulitis, DVT, chronic hypokalemia who was admitted 95/28/41 with complicated diverticulitis with intra-abdominal fluid collections.  Her fluid collections at that time were not amenable to drainage and she underwent robotic lower anterior rectosigmoid resection with colostomy and drainage of her pelvic abscess 03/31/22.  Repeat imaging 04/11/22 showed deep pelvic abscess.  She underwent aspiration and drainage 04/12/22.  Ultimately he was discharged home with drain in place. She now returns to IR drain clinic for evaluation and management of her L TG drain.   Patient presents to IR clinic today in improved condition.  She states she is overall feeling improved.  Her drain has continued to put out 20-40 mL of output daily.  Her daughter has been staying with her to assist with drain care including flushes and emptying the drain.  She last had surgical follow-up with Dr. [name] yesterday (1/29) and reports no concerns were identified at this appointment.  She is hopeful for drain removal today.   Past Medical History:  Diagnosis Date   Arthritis    Asthmatic bronchitis with acute exacerbation 02/06/2015   Bronchial pneumonia    Colon polyp    Diverticulitis    DVT (deep venous thrombosis) (HCC)    lower extremity   H/O cardiac catheterization 2004   Normal coronary arteries   Heart murmur    History of stress test 05/21/2011   Hx of echocardiogram 01/27/2010   Normal Ef 55% the transmitral spectral doppler flow pattern is normal for age. the left ventricular wall motion is normal   IPF (idiopathic pulmonary fibrosis) ([place]) 01/2018   Migraines    Pancreatitis    Thyroid  disease    TIA (transient ischemic attack)    8 years ago    Past Surgical History:  Procedure Laterality Date   BREAST BIOPSY     [place]   CARDIAC CATHETERIZATION     10/2013   CATARACT EXTRACTION Right 03/22/2018   LEFT HEART CATHETERIZATION WITH CORONARY ANGIOGRAM N/A 10/25/2013   Procedure: LEFT HEART CATHETERIZATION WITH CORONARY ANGIOGRAM;  Surgeon: Troy Sine, MD;  [facility] C[facility] LAB;  Service: Cardiovascular;  Laterality: N/A;   NECK SURGERY     x's 2   TOTAL KNEE ARTHROPLASTY     Bilateral x's 2   VAGINAL HYSTERECTOMY  10/17/1998   [name]   VIDEO BRONCHOSCOPY Bilateral 01/24/2018   Procedure: VIDEO BRONCHOSCOPY WITH FLUORO;  Surgeon: [name], MD;  Location: [facility];  Service: Cardiopulmonary;  Laterality: Bilateral;   XI ROBOTIC ASSISTED LOWER ANTERIOR RESECTION N/A 03/31/2022   Procedure: XI ROBOTIC ASSISTED LOWER ANTERIOR RESECTION;  Surgeon: Michael Boston, MD;  Location: [facility] ORS;  Service: General;  Laterality: N/A;    Allergies: Prednisone, Dilaudid [hydromorphone], Atrovent hfa [ipratropium bromide hfa], Cheratussin ac [guaifenesin-codeine], Diltiazem, Hydrocodone, Influenza vaccines, Morphine, Motrin [ibuprofen], Oxycodone, and Codeine  Medications: Prior to Admission medications   Medication Sig Start Date End Date Taking? Authorizing Provider  AMBULATORY NON FORMULARY MEDICATION Medication Name: Diltiazem 2% with Lidocaine 5%- use index finger, apply small amount of medication inside the rectum up to your first knuckle/joint for 8 weeks Patient not taking: Reported on 03/29/2022 03/20/22   Sharyn Creamer, MD  amoxicillin-clavulanate (AUGMENTIN) 875-125 MG tablet Take 1 tablet by  mouth 2 (two) times daily. 04/21/22 05/24/22  Vernelle Emerald, MD  aspirin EC 81 MG tablet Take 81 mg by mouth daily. Swallow whole.    [provider]  benzonatate (TESSALON) 100 MG capsule Take 200 mg by mouth as needed for cough. 03/06/22   [provider]  furosemide (LASIX) 40 MG tablet Take 1 tablet (40 mg total) by mouth daily as needed (if weight increases by more than 2 pounds in 1 day or 5 pounds in 1 week.). 04/21/22   Shalhoub, Sherryll Burger, MD  linaclotide [facility]) 145 MCG CAPS capsule Take 1 capsule (145 mcg total) by mouth daily before breakfast. 03/17/22   Sharyn Creamer, MD  lisinopril (ZESTRIL) 20 MG tablet Take 1 tablet (20 mg total) by mouth daily. 04/22/22   Shalhoub, Sherryll Burger, MD  ondansetron (ZOFRAN-ODT) 4 MG disintegrating tablet TAKE 1 TABLET BY MOUTH EVERY 8 HOURS AS NEEDED FOR NAUSEA AND VOMITING Patient taking differently: Take 4 mg by mouth as needed for nausea or vomiting. 07/18/21   Carollee Herter, Alferd Apa, DO  potassium chloride SA (KLOR-CON M20) 20 MEQ tablet 1 po qd 04/30/22   [name], DO  SYNTHROID 112 MCG tablet Take 1 tablet (112 mcg total) by mouth daily before breakfast. 03/02/22   Carollee Herter, Alferd Apa, DO  traZODone (DESYREL) 50 MG tablet Take 0.5-1 tablets (25-50 mg total) by mouth at bedtime as needed for sleep. 05/04/22   [name], DO  vitamin C (ASCORBIC ACID) 500 MG tablet Take 500 mg by mouth daily.    [provider]     Family History  Problem Relation Age of Onset   Ovarian cancer Mother    Uterine cancer Mother    Lung cancer Father    HIV Brother        46   Kidney cancer Brother    Lung cancer Brother    Other Brother        Mouth Cancer   Colon cancer Maternal Aunt    Heart disease Maternal Grandmother    Stroke Maternal Grandmother    Hypertension Maternal Grandmother    Diabetes Maternal Grandmother    Arthritis Other    Esophageal cancer Neg Hx    Liver disease Neg Hx    Rectal cancer Neg Hx    Stomach cancer Neg Hx     Social History   Socioeconomic History   Marital status: Married    Spouse name: [name]   Number of children: 3   Years of education: Jr college   Highest education level: Not on file  Occupational History    Occupation: Tour manager: UNEMPLOYED   Occupation: retired  Tobacco Use   Smoking status: Never   Smokeless tobacco: Never  Scientific laboratory technician Use: Never used  Substance and Sexual Activity   Alcohol use: No    Alcohol/week: 0.0 standard drinks of alcohol   Drug use: No   Sexual activity: Not on file  Other Topics Concern   Not on file  Social History Narrative   Lives with husband [name]   Caffeine use: coffee (2 cups per day)   Mostly right-handed   Social Determinants of Health   Financial Resource Strain: Not on file  Food Insecurity: No Food Insecurity (03/31/2022)   Hunger Vital Sign    Worried About Running Out of Food in the Last Year: Never true    Ran Out of Food  in the Last Year: Never true  Transportation Needs: No Transportation Needs (03/31/2022)   PRAPARE - Hydrologist (Medical): No    Lack of Transportation (Non-Medical): No  Physical Activity: Not on file  Stress: Not on file  Social Connections: Not on file     Vital Signs: There were no vitals taken for this visit.  Physical Exam NAD, alert Abdomen:  L TG pelvic drain in place.  Insertion assessed.  Stitch remains intact.  Site clean and dry.  Gravity bag in place with small amount of mixed density, cloudy fluid.    Imaging: No results found.  Labs:  CBC: Recent Labs    04/19/22 0303 04/20/22 0252 04/21/22 0241 04/28/22 0956  WBC 6.3 5.4 5.0 6.0  HGB 9.5* 9.1* 9.7* 13.5  HCT 30.2* 29.2* 31.0* 40.3  PLT 318 330 347 383    COAGS: Recent Labs    03/30/22 0129  INR 1.2    BMP: Recent Labs    04/19/22 0303 04/20/22 0252 04/21/22 0241 04/28/22 0956  NA 135 133* 136 135  K 3.6 3.2* 3.5 2.8*  CL 102 102 102 92*  CO2 '25 25 24 29  '$ GLUCOSE 82 85 77 118*  BUN 10 9 6* 8  CALCIUM 8.0* 7.7* 7.9* 9.4  CREATININE 0.59 0.67 0.58 0.90  GFRNONAA >60 >60 >60 >60    LIVER FUNCTION TESTS: Recent Labs    04/19/22 0303 04/20/22 0252  04/21/22 0241 04/28/22 0956  BILITOT 0.5 0.6 0.5 0.5  AST '23 20 20 '$ 38  ALT '21 18 17 24  '$ ALKPHOS 59 53 58 71  PROT 6.2* 6.0* 6.4* 7.9  ALBUMIN 2.2* 2.1* 2.2* 3.3*    Assessment: Intra-abdominal fluid collection s/p aspiration and drainage by Dr. Maryelizabeth Kaufmann 04/12/22  Drain Location: L TG Size: Fr size: 12 Fr Date of placement: 04/12/22  Currently to: Drain collection device: gravity Insertion site unremarkable. Suture and stat lock in place. Dressed appropriately.   Interval imaging/drain manipulation:  CT Abdomen Pelvis obtained today and reviewed by Dr. Vernard Gambles who notes improvement in fluid collection which is not yet resolved.   Plan: Per Dr. Vernard Gambles, plan to maintain drain to gravity with once daily flushes for additional 2 weeks.  No drain injection performed today due to high-volume output.  Daughter/patient encouraged to continue with current drain care and management.  Continue to record output daily.  Patient aware schedulers will reach out to set up follow-up CT, possible drain injection based on output trend in 2 weeks.    Signed: Docia Barrier, PA 05/06/2022, 3:26 PM   Please refer to Dr. Vernard Gambles attestation of this note for management and plan.

## 2022-05-07 ENCOUNTER — Telehealth: Payer: Self-pay | Admitting: *Deleted

## 2022-05-07 ENCOUNTER — Other Ambulatory Visit (HOSPITAL_COMMUNITY): Payer: Self-pay | Admitting: Interventional Radiology

## 2022-05-07 ENCOUNTER — Other Ambulatory Visit (HOSPITAL_COMMUNITY): Payer: Self-pay | Admitting: Surgery

## 2022-05-07 DIAGNOSIS — K651 Peritoneal abscess: Secondary | ICD-10-CM

## 2022-05-07 NOTE — Telephone Encounter (Signed)
        Patient  visited [place] ed on 04/28/2022  for constipation    Telephone encounter attempt :  1st  A HIPAA compliant voice message was left requesting a return call.  Instructed patient to call back at [phone].  [place] [phone] [address] , [place] [phone] Email : [email or web address]

## 2022-05-08 DIAGNOSIS — Z48815 Encounter for surgical aftercare following surgery on the digestive system: Secondary | ICD-10-CM | POA: Diagnosis not present

## 2022-05-08 DIAGNOSIS — I110 Hypertensive heart disease with heart failure: Secondary | ICD-10-CM | POA: Diagnosis not present

## 2022-05-08 DIAGNOSIS — J4520 Mild intermittent asthma, uncomplicated: Secondary | ICD-10-CM | POA: Diagnosis not present

## 2022-05-08 DIAGNOSIS — I5032 Chronic diastolic (congestive) heart failure: Secondary | ICD-10-CM | POA: Diagnosis not present

## 2022-05-08 DIAGNOSIS — Z433 Encounter for attention to colostomy: Secondary | ICD-10-CM | POA: Diagnosis not present

## 2022-05-08 DIAGNOSIS — J84112 Idiopathic pulmonary fibrosis: Secondary | ICD-10-CM | POA: Diagnosis not present

## 2022-05-12 DIAGNOSIS — Z48815 Encounter for surgical aftercare following surgery on the digestive system: Secondary | ICD-10-CM | POA: Diagnosis not present

## 2022-05-12 DIAGNOSIS — I5032 Chronic diastolic (congestive) heart failure: Secondary | ICD-10-CM | POA: Diagnosis not present

## 2022-05-12 DIAGNOSIS — J84112 Idiopathic pulmonary fibrosis: Secondary | ICD-10-CM | POA: Diagnosis not present

## 2022-05-12 DIAGNOSIS — I110 Hypertensive heart disease with heart failure: Secondary | ICD-10-CM | POA: Diagnosis not present

## 2022-05-12 DIAGNOSIS — Z433 Encounter for attention to colostomy: Secondary | ICD-10-CM | POA: Diagnosis not present

## 2022-05-12 DIAGNOSIS — J4520 Mild intermittent asthma, uncomplicated: Secondary | ICD-10-CM | POA: Diagnosis not present

## 2022-05-14 ENCOUNTER — Other Ambulatory Visit: Payer: Self-pay

## 2022-05-14 ENCOUNTER — Ambulatory Visit (INDEPENDENT_AMBULATORY_CARE_PROVIDER_SITE_OTHER): Payer: Medicare Other | Admitting: Internal Medicine

## 2022-05-14 ENCOUNTER — Encounter: Payer: Self-pay | Admitting: Internal Medicine

## 2022-05-14 VITALS — BP 101/68 | HR 90 | Temp 97.9°F

## 2022-05-14 DIAGNOSIS — J4520 Mild intermittent asthma, uncomplicated: Secondary | ICD-10-CM | POA: Diagnosis not present

## 2022-05-14 DIAGNOSIS — Z433 Encounter for attention to colostomy: Secondary | ICD-10-CM | POA: Diagnosis not present

## 2022-05-14 DIAGNOSIS — K651 Peritoneal abscess: Secondary | ICD-10-CM | POA: Diagnosis not present

## 2022-05-14 DIAGNOSIS — I110 Hypertensive heart disease with heart failure: Secondary | ICD-10-CM | POA: Diagnosis not present

## 2022-05-14 DIAGNOSIS — J84112 Idiopathic pulmonary fibrosis: Secondary | ICD-10-CM | POA: Diagnosis not present

## 2022-05-14 DIAGNOSIS — I5032 Chronic diastolic (congestive) heart failure: Secondary | ICD-10-CM | POA: Diagnosis not present

## 2022-05-14 DIAGNOSIS — Z48815 Encounter for surgical aftercare following surgery on the digestive system: Secondary | ICD-10-CM | POA: Diagnosis not present

## 2022-05-14 NOTE — Patient Instructions (Signed)
Will await your repeat ct scan   Continue antibiotics amoxicillin-clav twice a day until we discuss stopping (usually it is at least 1 week beyond the drain removal)   See me again in 3 weeks   Labs today

## 2022-05-14 NOTE — Progress Notes (Signed)
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[place]) 90/24/0973   IDA (iron deficiency anemia) 04/19/2022   Intra-abdominal abscess ([place]) 04/18/2022   Acute blood loss anemia 04/17/2022   Chronic diastolic CHF (congestive heart failure) ([place]) 53/29/9242   Toxic metabolic encephalopathy 68/34/1962   Ileus following gastrointestinal surgery ([place]) 04/17/2022   Pelvic abscess in female 03/30/2022   Flat foot 11/19/2021   S/P total knee arthroplasty, left 11/13/2021   Ankle impingement syndrome, left 11/13/2021   Rectal bleeding 08/22/2021   Abnormal thyroid blood test 08/22/2021   Abnormal kidney function 08/22/2021   Dumping syndrome 03/13/2020   Arthritis    Bronchial pneumonia    Colon polyp    DVT (deep venous thrombosis) (HCC)    Heart murmur    Migraines    Pancreatitis    Thyroid disease    Aortic root dilatation ([place]) 11/24/2019   Leukocytosis 11/06/2019   Diarrhea 11/06/2019   Generalized abdominal pain 11/06/2019   Pre-ulcerative corn or callous 09/07/2019   Nausea 09/07/2019   Pain of left calf 05/09/2019   Tibial pain 05/09/2019   Essential hypertension 02/08/2019   Healthcare maintenance 11/10/2018   Bradycardia 07/22/2018   Pansinusitis 06/23/2018   History of transient ischemic attack (TIA) 03/24/2018   Idiopathic pulmonary fibrosis ([place]) 03/24/2018   Allergic rhinitis 02/16/2018   Eustachian tube dysfunction, bilateral 02/16/2018   URI (upper respiratory infection) 02/16/2018   Seasonal allergic rhinitis due to pollen 02/16/2018   ILD (interstitial lung disease) ([place]) 01/07/2018   Dyspnea 01/07/2018   Acute respiratory failure ([place]) 01/06/2018   TIA (transient ischemic attack) 01/06/2018   Hypokalemia 12/15/2017   Chronic cough 11/26/2017   Basal cell carcinoma  (BCC) of neck 06/07/2017   Anisometropia 06/02/2017   [place] of macula of both eyes 06/02/2017   Photopsia of left eye 06/02/2017   Cardiac murmur 05/07/2017   Situational anxiety 05/07/2017   Headache 05/07/2017   Mitral and aortic insufficiency 05/07/2017   Migraine without status migrainosus, not intractable 06/09/2016   Insomnia 11/18/2015   Normal coronary arteries 05/20/2015   RBBB 05/20/2015   Abnormal CT of the chest 05/20/2015   Hx of myocardial infarction 05/08/2015   Bruising 05/08/2015   Upper airway cough syndrome 02/14/2015   Asthmatic bronchitis with acute exacerbation 02/06/2015   Mild intermittent asthma without complication 22/97/9892   Oliguria 12/28/2014   Hypothyroidism 10/25/2014   Fatigue 10/25/2014   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   Chronic venous insufficiency 10/24/2014   Varicose veins of leg with complications 11/94/1740   Degeneration of intervertebral disc of cervical region 06/29/2014   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   Generalized osteoarthritis of multiple sites 06/29/2014   History of stroke without residual deficits 06/29/2014   Palpitations 11/08/2013   Edema of lower extremity 10/03/2013   Hair loss 11/25/2012   Alopecia 11/25/2012   Diverticulitis of large intestine with perforation and abscess 07/14/2012   Hammer toe 06/13/2012   History of stress test 05/21/2011   Hx of echocardiogram 01/27/2010   H/O cardiac catheterization 2004      Subjective:    Patient ID: Alexandria [name], female    DOB: 1945-11-30, 77 y.o.   MRN: [id number]  Cc - diverticulitis/intraabd abscess   HPI:  Alexandria [name] is a 77 y.o. female with complicated  intraabd abscess from diverticulitis s/p end colostomy and rectosigmoid resection 03/31/22, here for hospital f/u  Patient had sepsis and intraabd abscess after resection 12/26. Had ir drain catheter 04/12/22 -- e faecalis/bacteroides Surgery wants to do outpatient  fistula study for stump leak  Patient discharged on amox-clav  ---------- 05/14/22 id clinic f/u Patient had IR visit 1/31 --> ct abd pelv improved fluid collection but not yet resolved --> planned repeat ct in another 2 weeks The drain is cared for by her daughter, very minimal drainage the last few days Tolerating amox-clav twice a day Saw ED 2 weeks ago as colostomy didn't have output and dx'ed with constipation -- currently managed conservatively with laxative. No f/c Minor pain here and there around colostomy    Allergies  Allergen Reactions   Prednisone Other (See Comments)    Changed personality    Dilaudid [Hydromorphone] Itching and Other (See Comments)    Dilaudid caused marked confusion   Atrovent Hfa [Ipratropium Bromide Hfa] Itching and Other (See Comments)    Pt could not sleep   Cheratussin Ac [Guaifenesin-Codeine] Other (See Comments)    Headaches    Diltiazem Itching and Other (See Comments)    Makes patient sick. Shakes. GI   Hydrocodone Itching   Influenza Vaccines Other (See Comments)    Pt reports heart attack after last flu shot   Morphine Itching   Motrin [Ibuprofen] Itching and Other (See Comments)    "Gives false reading in blood"   Oxycodone Itching   Codeine Itching, Nausea Only and Other (See Comments)    Pt takes promethazine with codeine at home      Outpatient Medications Prior to Visit  Medication Sig Dispense Refill   AMBULATORY NON FORMULARY MEDICATION Medication Name: Diltiazem 2% with Lidocaine 5%- use index finger, apply small amount of medication inside the rectum up to your first knuckle/joint for 8 weeks (Patient not taking: Reported on 03/29/2022) 30 g 0   amoxicillin-clavulanate (AUGMENTIN) 875-125 MG tablet Take 1 tablet by mouth 2 (two) times daily. 66 tablet 0   aspirin EC 81 MG tablet Take 81 mg by mouth daily. Swallow whole.     benzonatate (TESSALON) 100 MG capsule Take 200 mg by mouth as needed for cough.     furosemide  (LASIX) 40 MG tablet Take 1 tablet (40 mg total) by mouth daily as needed (if weight increases by more than 2 pounds in 1 day or 5 pounds in 1 week.). 180 tablet 1   linaclotide (LINZESS) 145 MCG CAPS capsule Take 1 capsule (145 mcg total) by mouth daily before breakfast. 30 capsule 3   lisinopril (ZESTRIL) 20 MG tablet Take 1 tablet (20 mg total) by mouth daily. 30 tablet 2   ondansetron (ZOFRAN-ODT) 4 MG disintegrating tablet TAKE 1 TABLET BY MOUTH EVERY 8 HOURS AS NEEDED FOR NAUSEA AND VOMITING (Patient taking differently: Take 4 mg by mouth as needed for nausea or vomiting.) 60 tablet 2   potassium chloride SA (KLOR-CON M20) 20 MEQ tablet 1 po qd 90 tablet 2   SYNTHROID 112 MCG tablet Take 1 tablet (112 mcg total) by mouth daily before breakfast. 90 tablet 0   traZODone (DESYREL) 50 MG tablet Take 0.5-1 tablets (25-50 mg total) by mouth at bedtime as needed for sleep. 30 tablet 3   vitamin C (ASCORBIC ACID) 500 MG tablet Take 500 mg by mouth daily.     No facility-administered medications prior to visit.     Social History  Socioeconomic History   Marital status: Married    Spouse name: [name]   Number of children: 3   Years of education: Jr college   Highest education level: Not on file  Occupational History   Occupation: Tour manager: UNEMPLOYED   Occupation: retired  Tobacco Use   Smoking status: Never   Smokeless tobacco: Never  Scientific laboratory technician Use: Never used  Substance and Sexual Activity   Alcohol use: No    Alcohol/week: 0.0 standard drinks of alcohol   Drug use: No   Sexual activity: Not on file  Other Topics Concern   Not on file  Social History Narrative   Lives with husband [name]   Caffeine use: coffee (2 cups per day)   Mostly right-handed   Social Determinants of Health   Financial Resource Strain: Not on file  Food Insecurity: No Food Insecurity (03/31/2022)   Hunger Vital Sign    Worried About Running Out of Food in the Last Year: Never  true    Ran Out of Food in the Last Year: Never true  Transportation Needs: No Transportation Needs (03/31/2022)   PRAPARE - Hydrologist (Medical): No    Lack of Transportation (Non-Medical): No  Physical Activity: Not on file  Stress: Not on file  Social Connections: Not on file  Intimate Partner Violence: Not At Risk (03/31/2022)   Humiliation, Afraid, Rape, and Kick questionnaire    Fear of Current or Ex-Partner: No    Emotionally Abused: No    Physically Abused: No    Sexually Abused: No      Review of Systems    All other ROS negative   Objective:    BP 101/68   Pulse 90   Temp 97.9 F (36.6 C) (Temporal)  Nursing note and vital signs reviewed.  Physical Exam     General/constitutional: no distress, pleasant HEENT: Normocephalic, PER, Conj Clear, EOMI, Oropharynx clear Neck supple CV: rrr no mrg Lungs: clear to auscultation, normal respiratory effort Abd: Soft, Nontender-- colostomy functioning; perc drain with minimal trace straw color fluid Ext: no edema Skin: No Rash Neuro: nonfocal MSK: no peripheral joint swelling/tenderness/warmth; back spines nontender  Labs:  Micro:  Serology:  Imaging: Reviewed: 05/06/22 abd pelv ct Other: Presacral pigtail drain catheter well-positioned in residual gas and fluid collection 3.1 x 1.8 cm (previously 3.2 x 2.8). No new or undrained collections.   Musculoskeletal: Thoracolumbar scoliosis with multilevel spondylitic change. No acute findings.   IMPRESSION: 1. Interval decrease in size of presacral abscess post percutaneous drainage catheter placement. No new or undrained collections. 2. Single loop of dilated small bowel in the left lower quadrant and pelvis, possibly ileus. Recommend attention on follow-up imaging.  Assessment & Plan:   Problem List Items Addressed This Visit       Other   Intra-abdominal abscess ([place]) - Primary   Relevant Orders   CBC   COMPLETE  METABOLIC PANEL WITH GFR   C-reactive protein      No orders of the defined types were placed in this encounter.    Abx: 1/15-c amox-clav   1/10-15 amp-sulbactam   12/24-1/10 piptazo  Assessment: 76 yo female with hypothyroidism, HFpEF, IPF, admitted 78/93 for complicated diverticulitis s/p rectosigmoid resection with colostomy on 81/01, course complicated by persistent/worsening intra-abd abscess evident by new fever and 1/6 ct abd pelv    Imaging: 1/6 ct abd pelv previous drain removed; worsening pelvic abscess near rectal stump 9cm   Micro: 1/6 bcx ngtd 1/7 new drain obtained abscess culture - e faecalis, bacteroides species   Procedures: 12/26 rectosigmoid resection; end colostomy creation 1/7 IR ct guided 12 Fr perc drain placement left transgluteal approach into pelvis abscess   He has required tpn which is currently being weaned off Patient is s/p new ir drain placement His wbc has remained normal  He was restarted on amp-sulb to reflect drain culture from 1/07   I reviewed surgery note from 04/18/22; "recommend outpatient drain study to r/o fistula stump leak in about 2 weeks"    --------- 05/14/22 id clinic assessment 1/31 ir study still with fluid collection but smaller by a little (still 3cm in greatest size); plan repeat ct in 2 weeks 2/15 Tolerating amox-clav Surgery had discussed with her eventual colostomy reversal   Discuss to continue abx until at least a week after drain is removed Continue amox-clav   I have spent a total of 40 minutes of face-to-face and non-face-to-face time, excluding clinical staff time, preparing to see patient, ordering tests and/or medications, and provide counseling the patient   Follow-up: Return in about 3 weeks (around 06/04/2022).      Jabier Mutton, [facility] 05/14/2022, 9:16 AM

## 2022-05-15 ENCOUNTER — Ambulatory Visit (HOSPITAL_COMMUNITY)
Admission: RE | Admit: 2022-05-15 | Discharge: 2022-05-15 | Disposition: A | Discharge status: Home / Self Care | Payer: Medicare Other | Source: Ambulatory Visit | Attending: Nurse Practitioner | Admitting: Nurse Practitioner

## 2022-05-15 DIAGNOSIS — K5780 Diverticulitis of intestine, part unspecified, with perforation and abscess without bleeding: Secondary | ICD-10-CM | POA: Insufficient documentation

## 2022-05-15 DIAGNOSIS — K9400 Colostomy complication, unspecified: Secondary | ICD-10-CM | POA: Diagnosis not present

## 2022-05-15 DIAGNOSIS — Z933 Colostomy status: Secondary | ICD-10-CM | POA: Diagnosis not present

## 2022-05-15 LAB — CBC
HCT: 37.8 % (ref 35.0–45.0)
Hemoglobin: 12.7 g/dL (ref 11.7–15.5)
MCH: 32.3 pg (ref 27.0–33.0)
MCHC: 33.6 g/dL (ref 32.0–36.0)
MCV: 96.2 fL (ref 80.0–100.0)
MPV: 9.3 fL (ref 7.5–12.5)
Platelets: 328 10*3/uL (ref 140–400)
RBC: 3.93 10*6/uL (ref 3.80–5.10)
RDW: 14.1 % (ref 11.0–15.0)
WBC: 5.4 10*3/uL (ref 3.8–10.8)

## 2022-05-15 LAB — COMPLETE METABOLIC PANEL WITH GFR
AG Ratio: 1 (calc) (ref 1.0–2.5)
ALT: 8 U/L (ref 6–29)
AST: 14 U/L (ref 10–35)
Albumin: 3.9 g/dL (ref 3.6–5.1)
Alkaline phosphatase (APISO): 86 U/L (ref 37–153)
BUN: 13 mg/dL (ref 7–25)
CO2: 26 mmol/L (ref 20–32)
Calcium: 9.6 mg/dL (ref 8.6–10.4)
Chloride: 103 mmol/L (ref 98–110)
Creat: 0.77 mg/dL (ref 0.60–1.00)
Globulin: 3.9 g/dL (calc) — ABNORMAL HIGH (ref 1.9–3.7)
Glucose, Bld: 107 mg/dL — ABNORMAL HIGH (ref 65–99)
Potassium: 4.3 mmol/L (ref 3.5–5.3)
Sodium: 139 mmol/L (ref 135–146)
Total Bilirubin: 0.5 mg/dL (ref 0.2–1.2)
Total Protein: 7.8 g/dL (ref 6.1–8.1)
eGFR: 79 mL/min/{1.73_m2} (ref >=60)

## 2022-05-15 LAB — C-REACTIVE PROTEIN: CRP: 6.7 mg/L (ref <8.0)

## 2022-05-15 NOTE — Progress Notes (Signed)
[facility]   Reason for visit:  LLQ colostomy HPI:  Perforated diverticulitis  Past Medical History:  Diagnosis Date   Arthritis    Asthmatic bronchitis with acute exacerbation 02/06/2015   Bronchial pneumonia    Colon polyp    Diverticulitis    DVT (deep venous thrombosis) (HCC)    lower extremity   H/O cardiac catheterization 2004   Normal coronary arteries   Heart murmur    History of stress test 05/21/2011   Hx of echocardiogram 01/27/2010   Normal Ef 55% the transmitral spectral doppler flow pattern is normal for age. the left ventricular wall motion is normal   IPF (idiopathic pulmonary fibrosis) ([place]) 01/2018   Migraines    Pancreatitis    Thyroid disease    TIA (transient ischemic attack)    8 years ago   Family History  Problem Relation Age of Onset   Ovarian cancer Mother    Uterine cancer Mother    Lung cancer Father    HIV Brother        26   Kidney cancer Brother    Lung cancer Brother    Other Brother        Mouth Cancer   Colon cancer Maternal Aunt    Heart disease Maternal Grandmother    Stroke Maternal Grandmother    Hypertension Maternal Grandmother    Diabetes Maternal Grandmother    Arthritis Other    Esophageal cancer Neg Hx    Liver disease Neg Hx    Rectal cancer Neg Hx    Stomach cancer Neg Hx    Allergies  Allergen Reactions   Prednisone Other (See Comments)    Changed personality    Dilaudid [Hydromorphone] Itching and Other (See Comments)    Dilaudid caused marked confusion   Atrovent Hfa [Ipratropium Bromide Hfa] Itching and Other (See Comments)    Pt could not sleep   Cheratussin Ac [Guaifenesin-Codeine] Other (See Comments)    Headaches    Diltiazem Itching and Other (See Comments)    Makes patient sick. Shakes. GI   Hydrocodone Itching   Influenza Vaccines Other (See Comments)    Pt reports heart attack after last flu shot   Morphine Itching   Motrin [Ibuprofen] Itching and Other (See Comments)    "Gives  false reading in blood"   Oxycodone Itching   Codeine Itching, Nausea Only and Other (See Comments)    Pt takes promethazine with codeine at home   Current Outpatient Medications  Medication Sig Dispense Refill Last Dose   AMBULATORY NON FORMULARY MEDICATION Medication Name: Diltiazem 2% with Lidocaine 5%- use index finger, apply small amount of medication inside the rectum up to your first knuckle/joint for 8 weeks (Patient not taking: Reported on 03/29/2022) 30 g 0    amoxicillin-clavulanate (AUGMENTIN) 875-125 MG tablet Take 1 tablet by mouth 2 (two) times daily. 66 tablet 0    aspirin EC 81 MG tablet Take 81 mg by mouth daily. Swallow whole.      benzonatate (TESSALON) 100 MG capsule Take 200 mg by mouth as needed for cough.      furosemide (LASIX) 40 MG tablet Take 1 tablet (40 mg total) by mouth daily as needed (if weight increases by more than 2 pounds in 1 day or 5 pounds in 1 week.). 180 tablet 1    linaclotide (LINZESS) 145 MCG CAPS capsule Take 1 capsule (145 mcg total) by mouth daily before breakfast. 30 capsule 3  lisinopril (ZESTRIL) 20 MG tablet Take 1 tablet (20 mg total) by mouth daily. 30 tablet 2    ondansetron (ZOFRAN-ODT) 4 MG disintegrating tablet TAKE 1 TABLET BY MOUTH EVERY 8 HOURS AS NEEDED FOR NAUSEA AND VOMITING (Patient taking differently: Take 4 mg by mouth as needed for nausea or vomiting.) 60 tablet 2    potassium chloride SA (KLOR-CON M20) 20 MEQ tablet 1 po qd 90 tablet 2    SYNTHROID 112 MCG tablet Take 1 tablet (112 mcg total) by mouth daily before breakfast. 90 tablet 0    traZODone (DESYREL) 50 MG tablet Take 0.5-1 tablets (25-50 mg total) by mouth at bedtime as needed for sleep. 30 tablet 3    vitamin C (ASCORBIC ACID) 500 MG tablet Take 500 mg by mouth daily.      No current facility-administered medications for this encounter.   ROS  Review of Systems  Constitutional:  Positive for activity change.       Is gaining strength, she reports   Gastrointestinal:        LLQ colostomy Midline abdominal wound  Skin: Negative.   Psychiatric/Behavioral: Negative.    All other systems reviewed and are negative.  Vital signs:  BP 132/66 (BP Location: Right Arm)   Pulse 76   Temp 98 F (36.7 C) (Oral)   Resp 20   SpO2 94%  Exam:  Physical Exam Vitals reviewed.  Constitutional:      Appearance: Normal appearance.  Abdominal:     Palpations: Abdomen is soft.     Comments: Midline wound  Skin:    General: Skin is warm and dry.  Neurological:     Mental Status: She is alert and oriented to person, place, and time.  Psychiatric:        Mood and Affect: Mood normal.        Behavior: Behavior normal.     Stoma type/location:  LLQ colostomy Stomal assessment/size:  1 3/8" pink and moist  budded Peristomal assessment:  intact, improved Treatment options for stomal/peristomal skin: barrier ring and 1 piece convex pouch Ostomy belt Output: soft brown stool Ostomy pouching: 1pc.convex Education provided:  Has been enrolled with [facility]    Impression/dx  colostomy Discussion  Continue gaining independence with ostomy care Plan  See back as needed.    Visit time: 45 minutes.   Domenic Moras FNP-BC

## 2022-05-18 DIAGNOSIS — J84112 Idiopathic pulmonary fibrosis: Secondary | ICD-10-CM | POA: Diagnosis not present

## 2022-05-18 DIAGNOSIS — I5032 Chronic diastolic (congestive) heart failure: Secondary | ICD-10-CM | POA: Diagnosis not present

## 2022-05-18 DIAGNOSIS — J4520 Mild intermittent asthma, uncomplicated: Secondary | ICD-10-CM | POA: Diagnosis not present

## 2022-05-18 DIAGNOSIS — Z48815 Encounter for surgical aftercare following surgery on the digestive system: Secondary | ICD-10-CM | POA: Diagnosis not present

## 2022-05-18 DIAGNOSIS — Z433 Encounter for attention to colostomy: Secondary | ICD-10-CM | POA: Diagnosis not present

## 2022-05-18 DIAGNOSIS — I110 Hypertensive heart disease with heart failure: Secondary | ICD-10-CM | POA: Diagnosis not present

## 2022-05-19 ENCOUNTER — Ambulatory Visit (INDEPENDENT_AMBULATORY_CARE_PROVIDER_SITE_OTHER): Payer: Medicare Other | Admitting: Family

## 2022-05-19 ENCOUNTER — Telehealth: Payer: Self-pay | Admitting: Family

## 2022-05-19 ENCOUNTER — Ambulatory Visit (HOSPITAL_BASED_OUTPATIENT_CLINIC_OR_DEPARTMENT_OTHER)
Admission: RE | Admit: 2022-05-19 | Discharge: 2022-05-19 | Disposition: A | Discharge status: Home / Self Care | Payer: Medicare Other | Source: Ambulatory Visit | Attending: Family | Admitting: Family

## 2022-05-19 VITALS — BP 103/66 | HR 92 | Temp 97.8°F | Resp 16

## 2022-05-19 DIAGNOSIS — M25571 Pain in right ankle and joints of right foot: Secondary | ICD-10-CM | POA: Insufficient documentation

## 2022-05-19 DIAGNOSIS — S93401A Sprain of unspecified ligament of right ankle, initial encounter: Secondary | ICD-10-CM

## 2022-05-19 NOTE — Assessment & Plan Note (Signed)
New, need to rule our fracture. If x ray positive for fracture, will plan referral to orthopedics.

## 2022-05-19 NOTE — Progress Notes (Signed)
Subjective:   By signing my name below, I, [name], attest that this documentation has been prepared under the direction and in the presence of Debbrah Alar, NP. 05/19/2022   Patient ID: Alexandria [name], female    DOB: 09-Jul-1945, 77 y.o.   MRN: [id number]  Chief Complaint  Patient presents with   Foot Injury    Patient reports a right foot injury yesterday afternoon    Foot Injury    Patient is in today for an office visit. She is accompanied by her husband.   She complains of a right foot injury since last night. She slipped and fell yesterday causing her injury to her ankle. She has pain while bearing weight on her right ankle.    She has been working with Dr. [name] for a pelvic abscess.  She underwent a robotic drainage, rectosigmoid resection/colostomy on 12/26.  She still has a drain and colostomy in place.    Past Medical History:  Diagnosis Date   Arthritis    Asthmatic bronchitis with acute exacerbation 02/06/2015   Bronchial pneumonia    Colon polyp    Diverticulitis    DVT (deep venous thrombosis) (HCC)    lower extremity   H/O cardiac catheterization 2004   Normal coronary arteries   Heart murmur    History of stress test 05/21/2011   Hx of echocardiogram 01/27/2010   Normal Ef 55% the transmitral spectral doppler flow pattern is normal for age. the left ventricular wall motion is normal   IPF (idiopathic pulmonary fibrosis) ([place]) 01/2018   Migraines    Pancreatitis    Thyroid disease    TIA (transient ischemic attack)    8 years ago    Past Surgical History:  Procedure Laterality Date   BREAST BIOPSY     [facility]   CARDIAC CATHETERIZATION     10/2013   CATARACT EXTRACTION Right 03/22/2018   LEFT HEART CATHETERIZATION WITH CORONARY ANGIOGRAM N/A 10/25/2013   Procedure: LEFT HEART CATHETERIZATION WITH CORONARY ANGIOGRAM;  Surgeon: Troy Sine, MD;  Location: [facility] CATH LAB;  Service: Cardiovascular;  Laterality: N/A;   NECK SURGERY      x's 2   TOTAL KNEE ARTHROPLASTY     Bilateral x's 2   VAGINAL HYSTERECTOMY  10/17/1998   [name]   VIDEO BRONCHOSCOPY Bilateral 01/24/2018   Procedure: VIDEO BRONCHOSCOPY WITH FLUORO;  Surgeon: [name], MD;  Location: [facility];  Service: Cardiopulmonary;  Laterality: Bilateral;   XI ROBOTIC ASSISTED LOWER ANTERIOR RESECTION N/A 03/31/2022   Procedure: XI ROBOTIC ASSISTED LOWER ANTERIOR RESECTION;  Surgeon: Michael Boston, MD;  Location: [facility];  Service: General;  Laterality: N/A;    Family History  Problem Relation Age of Onset   Ovarian cancer Mother    Uterine cancer Mother    Lung cancer Father    HIV Brother        56   Kidney cancer Brother    Lung cancer Brother    Other Brother        Mouth Cancer   Colon cancer Maternal Aunt    Heart disease Maternal Grandmother    Stroke Maternal Grandmother    Hypertension Maternal Grandmother    Diabetes Maternal Grandmother    Arthritis Other    Esophageal cancer Neg Hx    Liver disease Neg Hx    Rectal cancer Neg Hx    Stomach cancer Neg Hx     Social History   Socioeconomic History  Marital status: Married    Spouse name: [name]   Number of children: 3   Years of education: Jr college   Highest education level: Not on file  Occupational History   Occupation: Tour manager: UNEMPLOYED   Occupation: retired  Tobacco Use   Smoking status: Never   Smokeless tobacco: Never  Scientific laboratory technician Use: Never used  Substance and Sexual Activity   Alcohol use: No    Alcohol/week: 0.0 standard drinks of alcohol   Drug use: No   Sexual activity: Not on file  Other Topics Concern   Not on file  Social History Narrative   Lives with husband [name]   Caffeine use: coffee (2 cups per day)   Mostly right-handed   Social Determinants of Health   Financial Resource Strain: Not on file  Food Insecurity: No Food Insecurity (03/31/2022)   Hunger Vital Sign    Worried About Running Out of Food in the  Last Year: Never true    Ran Out of Food in the Last Year: Never true  Transportation Needs: No Transportation Needs (03/31/2022)   PRAPARE - Hydrologist (Medical): No    Lack of Transportation (Non-Medical): No  Physical Activity: Not on file  Stress: Not on file  Social Connections: Not on file  Intimate Partner Violence: Not At Risk (03/31/2022)   Humiliation, Afraid, Rape, and Kick questionnaire    Fear of Current or Ex-Partner: No    Emotionally Abused: No    Physically Abused: No    Sexually Abused: No    Outpatient Medications Prior to Visit  Medication Sig Dispense Refill   AMBULATORY NON FORMULARY MEDICATION Medication Name: Diltiazem 2% with Lidocaine 5%- use index finger, apply small amount of medication inside the rectum up to your first knuckle/joint for 8 weeks (Patient not taking: Reported on 03/29/2022) 30 g 0   amoxicillin-clavulanate (AUGMENTIN) 875-125 MG tablet Take 1 tablet by mouth 2 (two) times daily. 66 tablet 0   aspirin EC 81 MG tablet Take 81 mg by mouth daily. Swallow whole.     benzonatate (TESSALON) 100 MG capsule Take 200 mg by mouth as needed for cough.     furosemide (LASIX) 40 MG tablet Take 1 tablet (40 mg total) by mouth daily as needed (if weight increases by more than 2 pounds in 1 day or 5 pounds in 1 week.). 180 tablet 1   linaclotide (LINZESS) 145 MCG CAPS capsule Take 1 capsule (145 mcg total) by mouth daily before breakfast. 30 capsule 3   lisinopril (ZESTRIL) 20 MG tablet Take 1 tablet (20 mg total) by mouth daily. 30 tablet 2   ondansetron (ZOFRAN-ODT) 4 MG disintegrating tablet TAKE 1 TABLET BY MOUTH EVERY 8 HOURS AS NEEDED FOR NAUSEA AND VOMITING (Patient taking differently: Take 4 mg by mouth as needed for nausea or vomiting.) 60 tablet 2   potassium chloride SA (KLOR-CON M20) 20 MEQ tablet 1 po qd 90 tablet 2   SYNTHROID 112 MCG tablet Take 1 tablet (112 mcg total) by mouth daily before breakfast. 90 tablet 0    traZODone (DESYREL) 50 MG tablet Take 0.5-1 tablets (25-50 mg total) by mouth at bedtime as needed for sleep. 30 tablet 3   vitamin C (ASCORBIC ACID) 500 MG tablet Take 500 mg by mouth daily.     No facility-administered medications prior to visit.    Allergies  Allergen Reactions   Prednisone Other (See Comments)  Changed personality    Dilaudid [Hydromorphone] Itching and Other (See Comments)    Dilaudid caused marked confusion   Atrovent Hfa [Ipratropium Bromide Hfa] Itching and Other (See Comments)    Pt could not sleep   Cheratussin Ac [Guaifenesin-Codeine] Other (See Comments)    Headaches    Diltiazem Itching and Other (See Comments)    Makes patient sick. Shakes. GI   Hydrocodone Itching   Influenza Vaccines Other (See Comments)    Pt reports heart attack after last flu shot   Morphine Itching   Motrin [Ibuprofen] Itching and Other (See Comments)    "Gives false reading in blood"   Oxycodone Itching   Codeine Itching, Nausea Only and Other (See Comments)    Pt takes promethazine with codeine at home    Review of Systems  Musculoskeletal:        (+)swelling and bruising in right lateral ankle       Objective:    Physical Exam Constitutional:      General: She is not in acute distress.    Appearance: Normal appearance. She is not ill-appearing.  HENT:     Head: Normocephalic and atraumatic.     Right Ear: External ear normal.     Left Ear: External ear normal.  Eyes:     Extraocular Movements: Extraocular movements intact.     Pupils: Pupils are equal, round, and reactive to light.  Cardiovascular:     Rate and Rhythm: Normal rate and regular rhythm.     Heart sounds: Normal heart sounds. No murmur heard.    No gallop.  Pulmonary:     Effort: Pulmonary effort is normal. No respiratory distress.     Breath sounds: Normal breath sounds. No wheezing or rales.  Musculoskeletal:     Comments: Erythema and swelling right lateral ankle. She can flex her foot  but mild tenderness.    Skin:    General: Skin is warm and dry.  Neurological:     Mental Status: She is alert and oriented to person, place, and time.  Psychiatric:        Judgment: Judgment normal.     BP 103/66 (BP Location: Left Arm, Patient Position: Sitting, Cuff Size: Small)   Pulse 92   Temp 97.8 F (36.6 C) (Oral)   Resp 16   SpO2 97%  Wt Readings from Last 3 Encounters:  04/28/22 145 lb (65.8 kg)  04/17/22 160 lb (72.6 kg)  03/20/22 144 lb (65.3 kg)       Assessment & Plan:  Acute right ankle pain Assessment & Plan: New, need to rule our fracture. If x ray positive for fracture, will plan referral to orthopedics.   Orders: -     DG Ankle Complete Right; Future    I, Nance Pear, NP, personally preformed the services described in this documentation.  All medical record entries made by the scribe were at my direction and in my presence.  I have reviewed the chart and discharge instructions (if applicable) and agree that the record reflects my personal performance and is accurate and complete. 05/19/2022   I,[name],acting as a scribe for Nance Pear, NP.,have documented all relevant documentation on the behalf of Nance Pear, NP,as directed by  Nance Pear, NP while in the presence of Nance Pear, NP.   Nance Pear, NP

## 2022-05-19 NOTE — Telephone Encounter (Signed)
Please advise pt that her x-ray is negative for fracture.  Since she is having a lot of pain, I will refer her to sports medicine across the hall for further evaluation and possible walking boot.

## 2022-05-19 NOTE — Discharge Instructions (Signed)
Supplies from [employer]

## 2022-05-20 ENCOUNTER — Telehealth: Payer: Self-pay | Admitting: Family Medicine

## 2022-05-20 NOTE — Telephone Encounter (Signed)
Patient would like to know if she can get a boot for her foot. Please advise.

## 2022-05-20 NOTE — Telephone Encounter (Signed)
Alexandria [name] with [facility] called to advise she has been getting 10/10 pain level alerts so she followed up with patient today and she has been taken her Tramadol but has had to cut the pill in half due to the weird side effects she is getting. Alexandria [name] would like to know if Dr. Etter Sjogren would consider adjusting her pain management to maybe include Extra Strength Tylenol every 4-6 hours and using the Tramadol for breakthrough pain. She can be reached at [phone].

## 2022-05-20 NOTE — Telephone Encounter (Signed)
Spoke to patient, left walking boot for her at the front desk.

## 2022-05-21 ENCOUNTER — Ambulatory Visit
Admission: RE | Admit: 2022-05-21 | Discharge: 2022-05-21 | Disposition: A | Discharge status: Home / Self Care | Payer: Medicare Other | Source: Ambulatory Visit | Attending: Surgery | Admitting: Surgery

## 2022-05-21 ENCOUNTER — Ambulatory Visit
Admission: RE | Admit: 2022-05-21 | Discharge: 2022-05-21 | Disposition: A | Discharge status: Home / Self Care | Payer: Medicare Other | Source: Ambulatory Visit | Attending: Interventional Radiology | Admitting: Interventional Radiology

## 2022-05-21 DIAGNOSIS — K651 Peritoneal abscess: Secondary | ICD-10-CM

## 2022-05-21 MED ORDER — IOPAMIDOL (ISOVUE-300) INJECTION 61%
10.0000 mL | Freq: Once | INTRAVENOUS | Status: AC
  Administered 2022-05-21: 10 mL

## 2022-05-21 MED ORDER — IOPAMIDOL (ISOVUE-300) INJECTION 61%
100.0000 mL | Freq: Once | INTRAVENOUS | Status: AC | PRN
  Administered 2022-05-21: 100 mL via INTRAVENOUS

## 2022-05-21 NOTE — Telephone Encounter (Signed)
[facility] notified

## 2022-05-21 NOTE — Progress Notes (Signed)
Referring Physician(s): Hassell,Daniel  Chief Complaint: The patient is seen in follow up today s/p intra-abdominal abscess aspiration and drainage  History of present illness:  Alexandria [name] is a 77 y.o. female history of hypothyroidism, diastolic dysfunction, HTN, idiopathic pulmonary fibrosis, diverticulosis, diverticulitis, DVT, chronic hypokalemia who was admitted [id number] with complicated diverticulitis with intra-abdominal fluid collections.  Her fluid collections at that time were not amenable to drainage and she underwent robotic lower anterior rectosigmoid resection with colostomy and drainage of her pelvic abscess 03/31/22.  Repeat imaging 04/11/22 showed deep pelvic abscess.  She underwent aspiration and drainage 04/12/22.  Ultimately he was discharged home with drain in place. She was seen in follow-up at clinic on 05/06/22 where CT showed improvement in fluid collection, but continued output of 20-40 mL daily at that time necessitated that the drain remain in place. She now returns to IR drain clinic for evaluation and management of her L TG drain.   Today, the patient reports that her drain has been functioning well. The patient brought in a log of the daily output since discharge from the hospital, with output ranging from 10-20 mL over the past week. She states that the output is usually a thin, reddish fluid. She reports no fever, chills, pain, leakage from the site, or any concerns regarding the drain today. She is anxious to possibly have her drain removed today.   Past Medical History:  Diagnosis Date   Arthritis    Asthmatic bronchitis with acute exacerbation 02/06/2015   Bronchial pneumonia    Colon polyp    Diverticulitis    DVT (deep venous thrombosis) (HCC)    lower extremity   H/O cardiac catheterization 2004   Normal coronary arteries   Heart murmur    History of stress test 05/21/2011   Hx of echocardiogram 01/27/2010   Normal Ef 55% the transmitral  spectral doppler flow pattern is normal for age. the left ventricular wall motion is normal   IPF (idiopathic pulmonary fibrosis) ([place]) 01/2018   Migraines    Pancreatitis    Thyroid disease    TIA (transient ischemic attack)    8 years ago    Past Surgical History:  Procedure Laterality Date   BREAST BIOPSY     [facility]   CARDIAC CATHETERIZATION     10/2013   CATARACT EXTRACTION Right 03/22/2018   LEFT HEART CATHETERIZATION WITH CORONARY ANGIOGRAM N/A 10/25/2013   Procedure: LEFT HEART CATHETERIZATION WITH CORONARY ANGIOGRAM;  Surgeon: Troy Sine, MD;  Location: [facility] CATH LAB;  Service: Cardiovascular;  Laterality: N/A;   NECK SURGERY     x's 2   TOTAL KNEE ARTHROPLASTY     Bilateral x's 2   VAGINAL HYSTERECTOMY  10/17/1998   [name]   VIDEO BRONCHOSCOPY Bilateral 01/24/2018   Procedure: VIDEO BRONCHOSCOPY WITH FLUORO;  Surgeon: [name], MD;  Location: [facility];  Service: Cardiopulmonary;  Laterality: Bilateral;   XI ROBOTIC ASSISTED LOWER ANTERIOR RESECTION N/A 03/31/2022   Procedure: XI ROBOTIC ASSISTED LOWER ANTERIOR RESECTION;  Surgeon: Michael Boston, MD;  Location: [facility];  Service: General;  Laterality: N/A;    Allergies: Prednisone, Dilaudid [hydromorphone], Atrovent hfa [ipratropium bromide hfa], Cheratussin ac [guaifenesin-codeine], Diltiazem, Hydrocodone, Influenza vaccines, Morphine, Motrin [ibuprofen], Oxycodone, and Codeine  Medications: Prior to Admission medications   Medication Sig Start Date End Date Taking? Authorizing Provider  AMBULATORY NON FORMULARY MEDICATION Medication Name: Diltiazem 2% with Lidocaine 5%- use index finger, apply small amount of medication inside the rectum  up to your first knuckle/joint for 8 weeks Patient not taking: Reported on 03/29/2022 03/20/22   Sharyn Creamer, MD  amoxicillin-clavulanate (AUGMENTIN) 875-125 MG tablet Take 1 tablet by mouth 2 (two) times daily. 04/21/22 05/24/22  Vernelle Emerald, MD  aspirin EC 81  MG tablet Take 81 mg by mouth daily. Swallow whole.    [provider]  benzonatate (TESSALON) 100 MG capsule Take 200 mg by mouth as needed for cough. 03/06/22   [provider]  furosemide (LASIX) 40 MG tablet Take 1 tablet (40 mg total) by mouth daily as needed (if weight increases by more than 2 pounds in 1 day or 5 pounds in 1 week.). 04/21/22   Shalhoub, Sherryll Burger, MD  linaclotide [facility]) 145 MCG CAPS capsule Take 1 capsule (145 mcg total) by mouth daily before breakfast. 03/17/22   Sharyn Creamer, MD  lisinopril (ZESTRIL) 20 MG tablet Take 1 tablet (20 mg total) by mouth daily. 04/22/22   Shalhoub, Sherryll Burger, MD  ondansetron (ZOFRAN-ODT) 4 MG disintegrating tablet TAKE 1 TABLET BY MOUTH EVERY 8 HOURS AS NEEDED FOR NAUSEA AND VOMITING Patient taking differently: Take 4 mg by mouth as needed for nausea or vomiting. 07/18/21   Carollee Herter, Alferd Apa, DO  potassium chloride SA (KLOR-CON M20) 20 MEQ tablet 1 po qd 04/30/22   [name], DO  SYNTHROID 112 MCG tablet Take 1 tablet (112 mcg total) by mouth daily before breakfast. 03/02/22   Carollee Herter, Alferd Apa, DO  traZODone (DESYREL) 50 MG tablet Take 0.5-1 tablets (25-50 mg total) by mouth at bedtime as needed for sleep. 05/04/22   [name], DO  vitamin C (ASCORBIC ACID) 500 MG tablet Take 500 mg by mouth daily.    [provider]     Family History  Problem Relation Age of Onset   Ovarian cancer Mother    Uterine cancer Mother    Lung cancer Father    HIV Brother        27   Kidney cancer Brother    Lung cancer Brother    Other Brother        Mouth Cancer   Colon cancer Maternal Aunt    Heart disease Maternal Grandmother    Stroke Maternal Grandmother    Hypertension Maternal Grandmother    Diabetes Maternal Grandmother    Arthritis Other    Esophageal cancer Neg Hx    Liver disease Neg Hx    Rectal cancer Neg Hx    Stomach cancer Neg Hx     Social History   Socioeconomic History    Marital status: Married    Spouse name: [name]   Number of children: 3   Years of education: Jr college   Highest education level: Not on file  Occupational History   Occupation: Tour manager: UNEMPLOYED   Occupation: retired  Tobacco Use   Smoking status: Never   Smokeless tobacco: Never  Scientific laboratory technician Use: Never used  Substance and Sexual Activity   Alcohol use: No    Alcohol/week: 0.0 standard drinks of alcohol   Drug use: No   Sexual activity: Not on file  Other Topics Concern   Not on file  Social History Narrative   Lives with husband [name]   Caffeine use: coffee (2 cups per day)   Mostly right-handed   Social Determinants of Health   Financial Resource Strain: Not on file  Food Insecurity:  No Food Insecurity (03/31/2022)   Hunger Vital Sign    Worried About Running Out of Food in the Last Year: Never true    Ran Out of Food in the Last Year: Never true  Transportation Needs: No Transportation Needs (03/31/2022)   PRAPARE - Hydrologist (Medical): No    Lack of Transportation (Non-Medical): No  Physical Activity: Not on file  Stress: Not on file  Social Connections: Not on file     Vital Signs: BP (!) 162/76 (BP Location: Left Arm)   Pulse 73   Temp 97.7 F (36.5 C) (Oral)   SpO2 97%   Physical Exam Vitals reviewed.  Constitutional:      General: She is not in acute distress.    Appearance: She is not ill-appearing.  Pulmonary:     Effort: Pulmonary effort is normal.  Abdominal:     Comments: L transgluteal drain in place. Drain has small amount of serosanguineous fluid in gravity bag at time of exam. Drain insertion site has no erythema, no evidence of drainage, suture intact. Site is appropriately dressed with dressing that is clean, dry, and intact. Drain flushes and aspirates easily  Skin:    General: Skin is warm and dry.  Neurological:     Mental Status: She is alert and oriented to person, place,  and time.  Psychiatric:        Mood and Affect: Mood normal.        Behavior: Behavior normal.        Thought Content: Thought content normal.        Judgment: Judgment normal.     Imaging: DG Ankle Complete Right  Result Date: 05/19/2022 CLINICAL DATA:  Pain post fall EXAM: RIGHT ANKLE - COMPLETE 3+ VIEW COMPARISON:  None available FINDINGS: There is no evidence of fracture, dislocation, or joint effusion. There is no evidence of arthropathy or other focal bone abnormality. Orthopedic hardware in the first metatarsal partially visualized. soft tissues are unremarkable. IMPRESSION: Negative. Electronically Signed   By: Lucrezia Europe M.D.   On: 05/19/2022 17:06    Labs:  CBC: Recent Labs    04/20/22 0252 04/21/22 0241 04/28/22 0956 05/14/22 0936  WBC 5.4 5.0 6.0 5.4  HGB 9.1* 9.7* 13.5 12.7  HCT 29.2* 31.0* 40.3 37.8  PLT 330 347 383 328    COAGS: Recent Labs    03/30/22 0129  INR 1.2    BMP: Recent Labs    04/19/22 0303 04/20/22 0252 04/21/22 0241 04/28/22 0956 05/14/22 0936  NA 135 133* 136 135 139  K 3.6 3.2* 3.5 2.8* 4.3  CL 102 102 102 92* 103  CO2 25 25 24 29 26  $ GLUCOSE 82 85 77 118* 107*  BUN 10 9 6* 8 13  CALCIUM 8.0* 7.7* 7.9* 9.4 9.6  CREATININE 0.59 0.67 0.58 0.90 0.77  GFRNONAA >60 >60 >60 >60  --     LIVER FUNCTION TESTS: Recent Labs    04/19/22 0303 04/20/22 0252 04/21/22 0241 04/28/22 0956 05/14/22 0936  BILITOT 0.5 0.6 0.5 0.5 0.5  AST 23 20 20 $ 38 14  ALT 21 18 17 24 8  $ ALKPHOS 59 53 58 71  --   PROT 6.2* 6.0* 6.4* 7.9 7.8  ALBUMIN 2.2* 2.1* 2.2* 3.3*  --     Assessment:   #Intra-abdominal fluid collection s/p aspiration and drainage by Dr Maryelizabeth Kaufmann 04/12/22.  Drain Location: Left transgluteal Size: Fr size: 12 Fr Date of placement: 04/12/22  Currently to: Drain collection device: gravity -insertion site unremarkable -suture in place -dressed appropriately   Interval imaging/drain manipulation:  CT and drain injection  today -CT reveals resolution of abscess fluid pocket -Drain injection shows fistulous connection to remnants of sigmoid colon and rectum  Based on clinical picture, minimal drain output, continued antibiotics treatment, and the patient's preference for drain removal, it was decided by Dr Pascal Lux to remove her left transgluteal drain on today's visit. Drain was removed without issue. Patient reports follow-up appointment with Dr [name] in 2 weeks.   Please contact IR team with any questions or concerns.  Signed: Lura Em, PA-C 05/21/2022, 12:53 PM   Please refer to Dr. Pascal Lux' attestation of this note for management and plan.

## 2022-05-23 DIAGNOSIS — Z48815 Encounter for surgical aftercare following surgery on the digestive system: Secondary | ICD-10-CM | POA: Diagnosis not present

## 2022-05-23 DIAGNOSIS — D509 Iron deficiency anemia, unspecified: Secondary | ICD-10-CM | POA: Diagnosis not present

## 2022-05-23 DIAGNOSIS — I5032 Chronic diastolic (congestive) heart failure: Secondary | ICD-10-CM | POA: Diagnosis not present

## 2022-05-23 DIAGNOSIS — J4520 Mild intermittent asthma, uncomplicated: Secondary | ICD-10-CM | POA: Diagnosis not present

## 2022-05-23 DIAGNOSIS — J84112 Idiopathic pulmonary fibrosis: Secondary | ICD-10-CM | POA: Diagnosis not present

## 2022-05-23 DIAGNOSIS — F418 Other specified anxiety disorders: Secondary | ICD-10-CM | POA: Diagnosis not present

## 2022-05-23 DIAGNOSIS — Z8673 Personal history of transient ischemic attack (TIA), and cerebral infarction without residual deficits: Secondary | ICD-10-CM | POA: Diagnosis not present

## 2022-05-23 DIAGNOSIS — Z7982 Long term (current) use of aspirin: Secondary | ICD-10-CM | POA: Diagnosis not present

## 2022-05-23 DIAGNOSIS — I872 Venous insufficiency (chronic) (peripheral): Secondary | ICD-10-CM | POA: Diagnosis not present

## 2022-05-23 DIAGNOSIS — I080 Rheumatic disorders of both mitral and aortic valves: Secondary | ICD-10-CM | POA: Diagnosis not present

## 2022-05-23 DIAGNOSIS — E039 Hypothyroidism, unspecified: Secondary | ICD-10-CM | POA: Diagnosis not present

## 2022-05-23 DIAGNOSIS — I2510 Atherosclerotic heart disease of native coronary artery without angina pectoris: Secondary | ICD-10-CM | POA: Diagnosis not present

## 2022-05-23 DIAGNOSIS — I700 Atherosclerosis of aorta: Secondary | ICD-10-CM | POA: Diagnosis not present

## 2022-05-23 DIAGNOSIS — Z433 Encounter for attention to colostomy: Secondary | ICD-10-CM | POA: Diagnosis not present

## 2022-05-23 DIAGNOSIS — E876 Hypokalemia: Secondary | ICD-10-CM | POA: Diagnosis not present

## 2022-05-23 DIAGNOSIS — I110 Hypertensive heart disease with heart failure: Secondary | ICD-10-CM | POA: Diagnosis not present

## 2022-05-26 ENCOUNTER — Other Ambulatory Visit: Payer: Self-pay | Admitting: Family Medicine

## 2022-05-26 DIAGNOSIS — I5032 Chronic diastolic (congestive) heart failure: Secondary | ICD-10-CM | POA: Diagnosis not present

## 2022-05-26 DIAGNOSIS — I110 Hypertensive heart disease with heart failure: Secondary | ICD-10-CM | POA: Diagnosis not present

## 2022-05-26 DIAGNOSIS — J84112 Idiopathic pulmonary fibrosis: Secondary | ICD-10-CM | POA: Diagnosis not present

## 2022-05-26 DIAGNOSIS — Z433 Encounter for attention to colostomy: Secondary | ICD-10-CM | POA: Diagnosis not present

## 2022-05-26 DIAGNOSIS — Z48815 Encounter for surgical aftercare following surgery on the digestive system: Secondary | ICD-10-CM | POA: Diagnosis not present

## 2022-05-26 DIAGNOSIS — J4520 Mild intermittent asthma, uncomplicated: Secondary | ICD-10-CM | POA: Diagnosis not present

## 2022-05-26 DIAGNOSIS — G4700 Insomnia, unspecified: Secondary | ICD-10-CM

## 2022-05-27 ENCOUNTER — Other Ambulatory Visit: Payer: Self-pay | Admitting: Family Medicine

## 2022-05-27 ENCOUNTER — Other Ambulatory Visit: Payer: Self-pay

## 2022-05-27 DIAGNOSIS — E039 Hypothyroidism, unspecified: Secondary | ICD-10-CM

## 2022-05-27 MED ORDER — AMOXICILLIN-POT CLAVULANATE 875-125 MG PO TABS: 1.0000 | ORAL_TABLET | Freq: Two times a day (BID) | ORAL | 0 refills | Status: DC

## 2022-05-28 ENCOUNTER — Ambulatory Visit (INDEPENDENT_AMBULATORY_CARE_PROVIDER_SITE_OTHER): Payer: Medicare Other | Admitting: Family Medicine

## 2022-05-28 ENCOUNTER — Encounter: Payer: Self-pay | Admitting: Family Medicine

## 2022-05-28 VITALS — BP 131/50 | HR 59 | Temp 97.5°F | Resp 16 | Ht 64.0 in | Wt 136.2 lb

## 2022-05-28 DIAGNOSIS — R42 Dizziness and giddiness: Secondary | ICD-10-CM | POA: Diagnosis not present

## 2022-05-28 DIAGNOSIS — I951 Orthostatic hypotension: Secondary | ICD-10-CM

## 2022-05-28 LAB — EKG 12-LEAD

## 2022-05-28 NOTE — Progress Notes (Signed)
Acute Office Visit  Subjective:     Patient ID: Alexandria [name], female    DOB: 10/19/1945, 77 y.o.   MRN: [id number]  Chief Complaint  Patient presents with   Dizziness    Here for Dizziness    Patient is in today for dizziness.   Patient has been following with Dr. [name] (surgery) for pelvic abscess with drainage, rectosigmoid resection and colostomy (03/31/22). Reports that since being home, she has noticed that she has been getting dizzy with position changes. States it usually only happens with standing and lasts for a few minutes each time. She is very afraid that she is going to fall, so she has been using a Rolator to steady herself with position changes. States she still notes occasional drainage from her rectum, which surgeon told her was expected. She denies any signs of blood loss. Her stool in colostomy has had normal color. She denies any chest pain, palpitations, tachycardia, headaches, vision changes, swelling. Reports that she does not have much appetite, but is forcing herself to eat small meals throughout the day, but she is only drinking about 30 ounces of fluid per day. States she is urinating like normal. In reviewing her medication list, she reports taking the PRN lasix everyday along with the potassium and lisinopril. States the lisinopril is new since admission, and she denies prior history of hypertension.    All review of systems negative except what is listed in the HPI      Objective:    BP (!) 131/50 (BP Location: Right Arm, Patient Position: Sitting, Cuff Size: Normal)   Pulse (!) 59   Temp (!) 97.5 F (36.4 C) (Oral)   Resp 16   Ht 5' 4"$  (1.626 m)   Wt 136 lb 3.2 oz (61.8 kg)   SpO2 96%   BMI 23.38 kg/m    Orthostatics: Lying = 124/55 HR 70 Sitting = 94/59 HR 85 Standing = 77/55 HR 92    Physical Exam Vitals reviewed.  Constitutional:      Appearance: Normal appearance.  HENT:     Head: Normocephalic and atraumatic.   Cardiovascular:     Rate and Rhythm: Normal rate and regular rhythm.  Pulmonary:     Effort: Pulmonary effort is normal.     Breath sounds: Normal breath sounds.  Abdominal:     General: Abdomen is flat. Bowel sounds are normal.     Palpations: Abdomen is soft.     Comments: colostomy  Musculoskeletal:     Cervical back: Normal range of motion and neck supple.     Right lower leg: No edema.     Left lower leg: No edema.  Skin:    General: Skin is warm and dry.  Neurological:     General: No focal deficit present.     Mental Status: She is alert and oriented to person, place, and time. Mental status is at baseline.  Psychiatric:        Mood and Affect: Mood normal.        Behavior: Behavior normal.        Thought Content: Thought content normal.        Judgment: Judgment normal.        Results for orders placed or performed in visit on 05/28/22  EKG 12-Lead  Result Value Ref Range         EKG = NSR with PACs, no signs of ischemia      Assessment & Plan:  Problem List Items Addressed This Visit   None Visit Diagnoses     Dizziness    -  Primary   Relevant Orders   EKG 12-Lead (Completed)   Orthostatic hypotension       Relevant Orders   CBC with Differential/Platelet   Comprehensive metabolic panel     Stop taking the lasix every day, save for days when you notice any signs of fluid overload (swelling in your legs) Skip your lisinopril for now, we can reassess at follow-up next week Checking labs today  Wear compression socks Change positions slowly Make sure you are eating balanced meals and staying well hydrated  Follow-up early next week to reassess  Please contact office for follow-up if symptoms do not improve or worsen. Seek emergency care if symptoms become severe.       No orders of the defined types were placed in this encounter.   Return in about 4 days (around 06/01/2022) for orthostatic hypotension follow-up.  Terrilyn Saver, NP

## 2022-05-28 NOTE — Patient Instructions (Addendum)
Stop taking the lasix every day, save for days when you notice any signs of fluid overload (swelling in your legs) Skip your lisinopril for now, we can reassess at follow-up next week Checking labs today  Wear compression socks Change positions slowly Make sure you are eating balanced meals and staying well hydrated  Follow-up early next week to reassess  Please contact office for follow-up if symptoms do not improve or worsen. Seek emergency care if symptoms become severe.

## 2022-05-29 ENCOUNTER — Telehealth: Payer: Self-pay | Admitting: Family Medicine

## 2022-05-29 ENCOUNTER — Other Ambulatory Visit: Payer: Self-pay | Admitting: Family Medicine

## 2022-05-29 DIAGNOSIS — N739 Female pelvic inflammatory disease, unspecified: Secondary | ICD-10-CM | POA: Diagnosis not present

## 2022-05-29 DIAGNOSIS — F419 Anxiety disorder, unspecified: Secondary | ICD-10-CM

## 2022-05-29 DIAGNOSIS — J4520 Mild intermittent asthma, uncomplicated: Secondary | ICD-10-CM | POA: Diagnosis not present

## 2022-05-29 DIAGNOSIS — E440 Moderate protein-calorie malnutrition: Secondary | ICD-10-CM | POA: Diagnosis not present

## 2022-05-29 DIAGNOSIS — Z9049 Acquired absence of other specified parts of digestive tract: Secondary | ICD-10-CM | POA: Diagnosis not present

## 2022-05-29 DIAGNOSIS — J84112 Idiopathic pulmonary fibrosis: Secondary | ICD-10-CM | POA: Diagnosis not present

## 2022-05-29 DIAGNOSIS — I5032 Chronic diastolic (congestive) heart failure: Secondary | ICD-10-CM | POA: Diagnosis not present

## 2022-05-29 DIAGNOSIS — Z433 Encounter for attention to colostomy: Secondary | ICD-10-CM | POA: Diagnosis not present

## 2022-05-29 DIAGNOSIS — Z48815 Encounter for surgical aftercare following surgery on the digestive system: Secondary | ICD-10-CM | POA: Diagnosis not present

## 2022-05-29 DIAGNOSIS — I110 Hypertensive heart disease with heart failure: Secondary | ICD-10-CM | POA: Diagnosis not present

## 2022-05-29 DIAGNOSIS — K5720 Diverticulitis of large intestine with perforation and abscess without bleeding: Secondary | ICD-10-CM | POA: Diagnosis not present

## 2022-05-29 LAB — CBC WITH DIFFERENTIAL/PLATELET
Basophils Absolute: 0 10*3/uL (ref 0.0–0.1)
Basophils Relative: 0.9 % (ref 0.0–3.0)
Eosinophils Absolute: 0.5 10*3/uL (ref 0.0–0.7)
Eosinophils Relative: 9.5 % — ABNORMAL HIGH (ref 0.0–5.0)
HCT: 37.6 % (ref 36.0–46.0)
Hemoglobin: 12.6 g/dL (ref 12.0–15.0)
Lymphocytes Relative: 22.1 % (ref 12.0–46.0)
Lymphs Abs: 1.1 10*3/uL (ref 0.7–4.0)
MCHC: 33.6 g/dL (ref 30.0–36.0)
MCV: 97.1 fl (ref 78.0–100.0)
Monocytes Absolute: 0.9 10*3/uL (ref 0.1–1.0)
Monocytes Relative: 18.2 % — ABNORMAL HIGH (ref 3.0–12.0)
Neutro Abs: 2.5 10*3/uL (ref 1.4–7.7)
Neutrophils Relative %: 49.3 % (ref 43.0–77.0)
Platelets: 272 10*3/uL (ref 150.0–400.0)
RBC: 3.87 Mil/uL (ref 3.87–5.11)
RDW: 15.9 % — ABNORMAL HIGH (ref 11.5–15.5)
WBC: 5 10*3/uL (ref 4.0–10.5)

## 2022-05-29 LAB — COMPREHENSIVE METABOLIC PANEL
ALT: 7 U/L (ref 0–35)
AST: 13 U/L (ref 0–37)
Albumin: 3.9 g/dL (ref 3.5–5.2)
Alkaline Phosphatase: 78 U/L (ref 39–117)
BUN: 18 mg/dL (ref 6–23)
CO2: 30 mEq/L (ref 19–32)
Calcium: 9.3 mg/dL (ref 8.4–10.5)
Chloride: 98 mEq/L (ref 96–112)
Creatinine, Ser: 0.79 mg/dL (ref 0.40–1.20)
GFR: 72.14 mL/min (ref >60.00)
Glucose, Bld: 88 mg/dL (ref 70–99)
Potassium: 4.3 mEq/L (ref 3.5–5.1)
Sodium: 136 mEq/L (ref 135–145)
Total Bilirubin: 0.4 mg/dL (ref 0.2–1.2)
Total Protein: 7.4 g/dL (ref 6.0–8.3)

## 2022-05-29 MED ORDER — ALPRAZOLAM 0.25 MG PO TABS: 0.2500 mg | ORAL_TABLET | Freq: Two times a day (BID) | ORAL | 0 refills | Status: DC | PRN

## 2022-05-29 NOTE — Telephone Encounter (Signed)
Alexandria [name] [facility]) called stating that the pt is having a lot of anxiety regarding her changing the ostomy bag and was wondering if something could be called in for that.

## 2022-06-01 ENCOUNTER — Encounter: Payer: Self-pay | Admitting: Family Medicine

## 2022-06-01 ENCOUNTER — Ambulatory Visit (INDEPENDENT_AMBULATORY_CARE_PROVIDER_SITE_OTHER): Payer: Medicare Other | Admitting: Family Medicine

## 2022-06-01 VITALS — BP 136/82 | HR 48 | Temp 97.5°F | Resp 18 | Ht 64.0 in | Wt 136.2 lb

## 2022-06-01 DIAGNOSIS — J84112 Idiopathic pulmonary fibrosis: Secondary | ICD-10-CM | POA: Diagnosis not present

## 2022-06-01 DIAGNOSIS — K611 Rectal abscess: Secondary | ICD-10-CM | POA: Diagnosis not present

## 2022-06-01 DIAGNOSIS — E038 Other specified hypothyroidism: Secondary | ICD-10-CM

## 2022-06-01 DIAGNOSIS — R42 Dizziness and giddiness: Secondary | ICD-10-CM | POA: Insufficient documentation

## 2022-06-01 DIAGNOSIS — S93401A Sprain of unspecified ligament of right ankle, initial encounter: Secondary | ICD-10-CM

## 2022-06-01 DIAGNOSIS — R001 Bradycardia, unspecified: Secondary | ICD-10-CM

## 2022-06-01 NOTE — Progress Notes (Signed)
Subjective:   By signing my name below, I, Marlana Latus, attest that this documentation has been prepared under the direction and in the presence of [name], DO 06/01/22   Patient ID: Alexandria [name], female    DOB: 1946-01-07, 77 y.o.   MRN: [id number]  Chief Complaint  Patient presents with   Hypotension   Dizziness    Pt states sxs have improved.   Follow-up    HPI Patient is in today for a follow up. She is accompanied by her husband.   She has an abdominal pain. She reports that she would like help with her stoma twice a week but her [facility] home health aid is only able to come one a week. She states she struggles with fitting the piece for her stoma, even while trying to look in the mirror. She has an appointment with Dr. [name] tomorrow and follows up for her stoma on Friday.  She reports that she continues to recover from her previous infection. She states bleeding as resolved but she endorses drainage  She reports a recent fall where she injured her right foot and bruised her left leg. She denies any pain in her left leg but reports pain in her right foot.   She has stopped Lisinopril.  She is compliant with Lasix 40 mg as needed. She is still taking an antibiotic.   Past Medical History:  Diagnosis Date   Arthritis    Asthmatic bronchitis with acute exacerbation 02/06/2015   Bronchial pneumonia    Colon polyp    Diverticulitis    DVT (deep venous thrombosis) (HCC)    lower extremity   H/O cardiac catheterization 2004   Normal coronary arteries   Heart murmur    History of stress test 05/21/2011   Hx of echocardiogram 01/27/2010   Normal Ef 55% the transmitral spectral doppler flow pattern is normal for age. the left ventricular wall motion is normal   IPF (idiopathic pulmonary fibrosis) ([place]) 01/2018   Migraines    Pancreatitis    Thyroid disease    TIA (transient ischemic attack)    8 years ago    Past Surgical History:  Procedure  Laterality Date   BREAST BIOPSY     [place]   CARDIAC CATHETERIZATION     10/2013   CATARACT EXTRACTION Right 03/22/2018   LEFT HEART CATHETERIZATION WITH CORONARY ANGIOGRAM N/A 10/25/2013   Procedure: LEFT HEART CATHETERIZATION WITH CORONARY ANGIOGRAM;  Surgeon: Troy Sine, MD;  Location: [facility] CATH LAB;  Service: Cardiovascular;  Laterality: N/A;   NECK SURGERY     x's 2   TOTAL KNEE ARTHROPLASTY     Bilateral x's 2   VAGINAL HYSTERECTOMY  10/17/1998   [name]   VIDEO BRONCHOSCOPY Bilateral 01/24/2018   Procedure: VIDEO BRONCHOSCOPY WITH FLUORO;  Surgeon: [name], MD;  Location: [place];  Service: Cardiopulmonary;  Laterality: Bilateral;   XI ROBOTIC ASSISTED LOWER ANTERIOR RESECTION N/A 03/31/2022   Procedure: XI ROBOTIC ASSISTED LOWER ANTERIOR RESECTION;  Surgeon: Michael Boston, MD;  Location: [facility] OR[facility];  Service: General;  Laterality: N/A;    Family History  Problem Relation Age of Onset   Ovarian cancer Mother    Uterine cancer Mother    Lung cancer Father    HIV Brother        42   Kidney cancer Brother    Lung cancer Brother    Other Brother        Mouth Cancer  Colon cancer Maternal Aunt    Heart disease Maternal Grandmother    Stroke Maternal Grandmother    Hypertension Maternal Grandmother    Diabetes Maternal Grandmother    Arthritis Other    Esophageal cancer Neg Hx    Liver disease Neg Hx    Rectal cancer Neg Hx    Stomach cancer Neg Hx     Social History   Socioeconomic History   Marital status: Married    Spouse name: [name]   Number of children: 3   Years of education: Jr college   Highest education level: Not on file  Occupational History   Occupation: Tour manager: UNEMPLOYED   Occupation: retired  Tobacco Use   Smoking status: Never   Smokeless tobacco: Never  Scientific laboratory technician Use: Never used  Substance and Sexual Activity   Alcohol use: No    Alcohol/week: 0.0 standard drinks of alcohol   Drug use: No    Sexual activity: Not on file  Other Topics Concern   Not on file  Social History Narrative   Lives with husband [name]   Caffeine use: coffee (2 cups per day)   Mostly right-handed   Social Determinants of Health   Financial Resource Strain: Not on file  Food Insecurity: No Food Insecurity (03/31/2022)   Hunger Vital Sign    Worried About Running Out of Food in the Last Year: Never true    Ran Out of Food in the Last Year: Never true  Transportation Needs: No Transportation Needs (03/31/2022)   PRAPARE - Hydrologist (Medical): No    Lack of Transportation (Non-Medical): No  Physical Activity: Not on file  Stress: Not on file  Social Connections: Not on file  Intimate Partner Violence: Not At Risk (03/31/2022)   Humiliation, Afraid, Rape, and Kick questionnaire    Fear of Current or Ex-Partner: No    Emotionally Abused: No    Physically Abused: No    Sexually Abused: No    Outpatient Medications Prior to Visit  Medication Sig Dispense Refill   ALPRAZolam (XANAX) 0.25 MG tablet Take 1 tablet (0.25 mg total) by mouth 2 (two) times daily as needed for anxiety. 20 tablet 0   AMBULATORY NON FORMULARY MEDICATION Medication Name: Diltiazem 2% with Lidocaine 5%- use index finger, apply small amount of medication inside the rectum up to your first knuckle/joint for 8 weeks 30 g 0   amoxicillin-clavulanate (AUGMENTIN) 875-125 MG tablet Take 1 tablet by mouth 2 (two) times daily. 20 tablet 0   aspirin EC 81 MG tablet Take 81 mg by mouth daily. Swallow whole.     benzonatate (TESSALON) 100 MG capsule Take 200 mg by mouth as needed for cough.     furosemide (LASIX) 40 MG tablet Take 1 tablet (40 mg total) by mouth daily as needed (if weight increases by more than 2 pounds in 1 day or 5 pounds in 1 week.). 180 tablet 1   linaclotide (LINZESS) 145 MCG CAPS capsule Take 1 capsule (145 mcg total) by mouth daily before breakfast. 30 capsule 3   ondansetron (ZOFRAN-ODT)  4 MG disintegrating tablet TAKE 1 TABLET BY MOUTH EVERY 8 HOURS AS NEEDED FOR NAUSEA AND VOMITING (Patient taking differently: Take 4 mg by mouth as needed for nausea or vomiting.) 60 tablet 2   potassium chloride SA (KLOR-CON M20) 20 MEQ tablet 1 po qd 90 tablet 2   SYNTHROID 112 MCG tablet TAKE 1  TABLET BY MOUTH DAILY BEFORE BREAKFAST. 90 tablet 0   traZODone (DESYREL) 50 MG tablet TAKE 0.5-1 TABLETS BY MOUTH AT BEDTIME AS NEEDED FOR SLEEP. 90 tablet 2   vitamin C (ASCORBIC ACID) 500 MG tablet Take 500 mg by mouth daily.     lisinopril (ZESTRIL) 20 MG tablet Take 1 tablet (20 mg total) by mouth daily. 30 tablet 2   No facility-administered medications prior to visit.    Allergies  Allergen Reactions   Prednisone Other (See Comments)    Changed personality    Dilaudid [Hydromorphone] Itching and Other (See Comments)    Dilaudid caused marked confusion   Atrovent Hfa [Ipratropium Bromide Hfa] Itching and Other (See Comments)    Pt could not sleep   Cheratussin Ac [Guaifenesin-Codeine] Other (See Comments)    Headaches    Diltiazem Itching and Other (See Comments)    Makes patient sick. Shakes. GI   Hydrocodone Itching   Influenza Vaccines Other (See Comments)    Pt reports heart attack after last flu shot   Morphine Itching   Motrin [Ibuprofen] Itching and Other (See Comments)    "Gives false reading in blood"   Oxycodone Itching   Codeine Itching, Nausea Only and Other (See Comments)    Pt takes promethazine with codeine at home    Review of Systems  Constitutional:  Negative for fever and malaise/fatigue.  HENT:  Negative for congestion.   Eyes:  Negative for blurred vision.  Respiratory:  Negative for cough and shortness of breath.   Cardiovascular:  Negative for chest pain, palpitations and leg swelling.  Gastrointestinal:  Negative for vomiting.  Musculoskeletal:  Negative for back pain.  Skin:  Negative for rash.  Neurological:  Negative for loss of consciousness and  headaches.       Objective:    Physical Exam Vitals and nursing note reviewed.  Constitutional:      Appearance: She is well-developed.  HENT:     Head: Normocephalic and atraumatic.  Eyes:     Conjunctiva/sclera: Conjunctivae normal.  Neck:     Thyroid: No thyromegaly.     Vascular: No carotid bruit or JVD.  Cardiovascular:     Rate and Rhythm: Regular rhythm. Bradycardia present.     Heart sounds: Normal heart sounds. No murmur heard.    Comments: Irregular rate - bradycardia Pulmonary:     Effort: Pulmonary effort is normal. No respiratory distress.     Breath sounds: Normal breath sounds. No wheezing or rales.  Chest:     Chest wall: No tenderness.  Musculoskeletal:     Cervical back: Normal range of motion and neck supple.  Neurological:     Mental Status: She is alert and oriented to person, place, and time.     BP 136/82 (BP Location: Left Arm, Patient Position: Sitting, Cuff Size: Normal)   Pulse (!) 48   Temp (!) 97.5 F (36.4 C) (Oral)   Resp 18   Ht '5\' 4"'$  (1.626 m)   Wt 136 lb 3.2 oz (61.8 kg)   SpO2 98%   BMI 23.38 kg/m  Wt Readings from Last 3 Encounters:  06/01/22 136 lb 3.2 oz (61.8 kg)  05/28/22 136 lb 3.2 oz (61.8 kg)  04/28/22 145 lb (65.8 kg)       Assessment & Plan:  Sprain of right ankle, unspecified ligament, initial encounter Assessment & Plan: Pt seen previously----  refer to sport med  Orders: -     Ambulatory referral to Sports  Medicine  Bradycardia -     Ambulatory referral to Cardiology  Dizziness Assessment & Plan: Better off bp med and lasix  EKG done last office visit  F/u cardiology due to bradycardia   Other specified hypothyroidism  IPF (idiopathic pulmonary fibrosis) ([place])  Rectal abscess Assessment & Plan: S/p surgery and colectomy------ per surgery       I,Rachel Rivera,acting as a scribe for [name], DO.,have documented all relevant documentation on the behalf of [name],  DO,as directed by  [name], DO while in the presence of [name], DO.   I, [name], DO, personally preformed the services described in this documentation.  All medical record entries made by the scribe were at my direction and in my presence.  I have reviewed the chart and discharge instructions (if applicable) and agree that the record reflects my personal performance and is accurate and complete. 06/01/22   [name], DO

## 2022-06-01 NOTE — Assessment & Plan Note (Signed)
S/p surgery and colectomy------ per surgery

## 2022-06-01 NOTE — Assessment & Plan Note (Signed)
Better off bp med and lasix  EKG done last office visit  F/u cardiology due to bradycardia

## 2022-06-01 NOTE — Assessment & Plan Note (Signed)
Pt seen previously----  refer to sport med

## 2022-06-02 ENCOUNTER — Ambulatory Visit: Payer: Self-pay | Admitting: Surgery

## 2022-06-03 ENCOUNTER — Ambulatory Visit: Payer: BC Managed Care – PPO | Admitting: Family Medicine

## 2022-06-04 ENCOUNTER — Ambulatory Visit (INDEPENDENT_AMBULATORY_CARE_PROVIDER_SITE_OTHER): Payer: Medicare Other | Admitting: Internal Medicine

## 2022-06-04 ENCOUNTER — Other Ambulatory Visit: Payer: Self-pay

## 2022-06-04 ENCOUNTER — Encounter: Payer: Self-pay | Admitting: Internal Medicine

## 2022-06-04 ENCOUNTER — Other Ambulatory Visit: Payer: Self-pay | Admitting: Urology

## 2022-06-04 VITALS — BP 142/77 | HR 85 | Temp 97.9°F | Ht 64.0 in | Wt 140.0 lb

## 2022-06-04 DIAGNOSIS — K651 Peritoneal abscess: Secondary | ICD-10-CM | POA: Diagnosis not present

## 2022-06-04 NOTE — Progress Notes (Signed)
[facility]  Patient Active Problem List   Diagnosis Date Noted   Sprain of right ankle 06/01/2022   Dizziness 06/01/2022   Rectal abscess 06/01/2022   Acute right ankle pain 05/19/2022   Irritant contact dermatitis associated with fecal stoma 05/02/2022   Slow transit constipation 05/02/2022   Colostomy complication ([place]) [id number]   IDA (iron deficiency anemia) 04/19/2022   Intra-abdominal abscess ([place]) 04/18/2022   Acute blood loss anemia 04/17/2022   Chronic diastolic CHF (congestive heart failure) ([place]) 0000000   Toxic metabolic encephalopathy 0000000   Ileus following gastrointestinal surgery ([place]) 04/17/2022   Pelvic abscess in female 03/30/2022   Flat foot 11/19/2021   S/P total knee arthroplasty, left 11/13/2021   Ankle impingement syndrome, left 11/13/2021   Rectal bleeding 08/22/2021   Abnormal thyroid blood test 08/22/2021   Abnormal kidney function 08/22/2021   Dumping syndrome 03/13/2020   Arthritis    Bronchial pneumonia    Colon polyp    DVT (deep venous thrombosis) (HCC)    Heart murmur    Migraines    Pancreatitis    Thyroid disease    Aortic root dilatation ([place]) 11/24/2019   Leukocytosis 11/06/2019   Diarrhea 11/06/2019   Generalized abdominal pain 11/06/2019   Pre-ulcerative corn or callous 09/07/2019   Nausea 09/07/2019   Pain of left calf 05/09/2019   Tibial pain 05/09/2019   Essential hypertension 02/08/2019   Healthcare maintenance 11/10/2018   Bradycardia 07/22/2018   Pansinusitis 06/23/2018   History of transient ischemic attack (TIA) 03/24/2018   IPF (idiopathic pulmonary fibrosis) ([place]) 03/24/2018   Allergic rhinitis 02/16/2018   Eustachian tube dysfunction, bilateral 02/16/2018   URI (upper respiratory infection) 02/16/2018   Seasonal allergic rhinitis due to pollen 02/16/2018   ILD (interstitial lung disease) ([place]) 01/07/2018   Dyspnea 01/07/2018   Acute respiratory failure ([place])  01/06/2018   TIA (transient ischemic attack) 01/06/2018   Hypokalemia 12/15/2017   Chronic cough 11/26/2017   Basal cell carcinoma (BCC) of neck 06/07/2017   Anisometropia 06/02/2017   [place] of macula of both eyes 06/02/2017   Photopsia of left eye 06/02/2017   Cardiac murmur 05/07/2017   Situational anxiety 05/07/2017   Headache 05/07/2017   Mitral and aortic insufficiency 05/07/2017   Migraine without status migrainosus, not intractable 06/09/2016   Insomnia 11/18/2015   Normal coronary arteries 05/20/2015   RBBB 05/20/2015   Abnormal CT of the chest 05/20/2015   Hx of myocardial infarction 05/08/2015   Bruising 05/08/2015   Upper airway cough syndrome 02/14/2015   Asthmatic bronchitis with acute exacerbation 02/06/2015   Mild intermittent asthma without complication 123XX123   Oliguria 12/28/2014   Hypothyroidism 10/25/2014   Fatigue 10/25/2014   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   Chronic venous insufficiency 10/24/2014   Varicose veins of leg with complications [id number]   Degeneration of intervertebral disc of cervical region 06/29/2014   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   Generalized osteoarthritis of multiple sites 06/29/2014   History of stroke without residual deficits 06/29/2014   Palpitations 11/08/2013   Edema of lower extremity 10/03/2013   Hair loss 11/25/2012   Alopecia 11/25/2012   Diverticulitis of large intestine with perforation and abscess 07/14/2012   Hammer toe 06/13/2012   History of stress test 05/21/2011   Hx of echocardiogram 01/27/2010   H/O cardiac catheterization 2004      Subjective:    Patient ID: Alexandria [name], female    DOB: 1946-01-04, 77  y.o.   MRN: [id number]  Cc - diverticulitis/intraabd abscess   HPI:  Alexandria [name] is a 77 y.o. female with complicated intraabd abscess from diverticulitis s/p end colostomy and rectosigmoid resection 03/31/22, here for hospital f/u  Patient had  sepsis and intraabd abscess after resection 12/26. Had ir drain catheter 04/12/22 -- e faecalis/bacteroides Surgery wants to do outpatient fistula study for stump leak  Patient discharged on amox-clav  ---------- 05/14/22 id clinic f/u Patient had IR visit 1/31 --> ct abd pelv improved fluid collection but not yet resolved --> planned repeat ct in another 2 weeks The drain is cared for by her daughter, very minimal drainage the last few days Tolerating amox-clav twice a day Saw ED 2 weeks ago as colostomy didn't have output and dx'ed with constipation -- currently managed conservatively with laxative. No f/c Minor pain here and there around colostomy    06/04/22 id clinic f/u Ir study done 2/15 -- no catheter drainage output and ct showed no further abscess so drain removed She is on the last day of amox-clav  She has rectal mucoid clear discharge still.   No f/c No n/v  April 18 is the date for colostomy reversal   Allergies  Allergen Reactions   Prednisone Other (See Comments)    Changed personality    Dilaudid [Hydromorphone] Itching and Other (See Comments)    Dilaudid caused marked confusion   Atrovent Hfa [Ipratropium Bromide Hfa] Itching and Other (See Comments)    Pt could not sleep   Cheratussin Ac [Guaifenesin-Codeine] Other (See Comments)    Headaches    Diltiazem Itching and Other (See Comments)    Makes patient sick. Shakes. GI   Hydrocodone Itching   Influenza Vaccines Other (See Comments)    Pt reports heart attack after last flu shot   Morphine Itching   Motrin [Ibuprofen] Itching and Other (See Comments)    "Gives false reading in blood"   Oxycodone Itching   Codeine Itching, Nausea Only and Other (See Comments)    Pt takes promethazine with codeine at home      Outpatient Medications Prior to Visit  Medication Sig Dispense Refill   ALPRAZolam (XANAX) 0.25 MG tablet Take 1 tablet (0.25 mg total) by mouth 2 (two) times daily as needed for anxiety. 20  tablet 0   AMBULATORY NON FORMULARY MEDICATION Medication Name: Diltiazem 2% with Lidocaine 5%- use index finger, apply small amount of medication inside the rectum up to your first knuckle/joint for 8 weeks 30 g 0   aspirin EC 81 MG tablet Take 81 mg by mouth daily. Swallow whole.     benzonatate (TESSALON) 100 MG capsule Take 200 mg by mouth as needed for cough.     furosemide (LASIX) 40 MG tablet Take 1 tablet (40 mg total) by mouth daily as needed (if weight increases by more than 2 pounds in 1 day or 5 pounds in 1 week.). 180 tablet 1   linaclotide (LINZESS) 145 MCG CAPS capsule Take 1 capsule (145 mcg total) by mouth daily before breakfast. 30 capsule 3   ondansetron (ZOFRAN-ODT) 4 MG disintegrating tablet TAKE 1 TABLET BY MOUTH EVERY 8 HOURS AS NEEDED FOR NAUSEA AND VOMITING (Patient taking differently: Take 4 mg by mouth as needed for nausea or vomiting.) 60 tablet 2   potassium chloride SA (KLOR-CON M20) 20 MEQ tablet 1 po qd 90 tablet 2   SYNTHROID 112 MCG tablet TAKE 1 TABLET BY MOUTH DAILY BEFORE BREAKFAST. 90 tablet  0   traZODone (DESYREL) 50 MG tablet TAKE 0.5-1 TABLETS BY MOUTH AT BEDTIME AS NEEDED FOR SLEEP. 90 tablet 2   vitamin C (ASCORBIC ACID) 500 MG tablet Take 500 mg by mouth daily.     No facility-administered medications prior to visit.     Social History   Socioeconomic History   Marital status: Married    Spouse name: [name]   Number of children: 3   Years of education: Jr college   Highest education level: Not on file  Occupational History   Occupation: Tour manager: UNEMPLOYED   Occupation: retired  Tobacco Use   Smoking status: Never   Smokeless tobacco: Never  Scientific laboratory technician Use: Never used  Substance and Sexual Activity   Alcohol use: No    Alcohol/week: 0.0 standard drinks of alcohol   Drug use: No   Sexual activity: Not on file  Other Topics Concern   Not on file  Social History Narrative   Lives with husband [name]   Caffeine use:  coffee (2 cups per day)   Mostly right-handed   Social Determinants of Health   Financial Resource Strain: Not on file  Food Insecurity: No Food Insecurity (03/31/2022)   Hunger Vital Sign    Worried About Running Out of Food in the Last Year: Never true    Ran Out of Food in the Last Year: Never true  Transportation Needs: No Transportation Needs (03/31/2022)   PRAPARE - Hydrologist (Medical): No    Lack of Transportation (Non-Medical): No  Physical Activity: Not on file  Stress: Not on file  Social Connections: Not on file  Intimate Partner Violence: Not At Risk (03/31/2022)   Humiliation, Afraid, Rape, and Kick questionnaire    Fear of Current or Ex-Partner: No    Emotionally Abused: No    Physically Abused: No    Sexually Abused: No      Review of Systems    All other ROS negative   Objective:    There were no vitals taken for this visit. Nursing note and vital signs reviewed.  Physical Exam     General/constitutional: no distress, pleasant HEENT: Normocephalic, PER, Conj Clear, EOMI, Oropharynx clear Neck supple CV: rrr no mrg Lungs: clear to auscultation, normal respiratory effort Abd: Soft, Nontender - colostomy functioning Ext: no edema Skin: No Rash Neuro: nonfocal MSK: no peripheral joint swelling/tenderness/warmth; back spines nontender    Labs: Lab Results  Component Value Date   WBC 5.0 05/28/2022   HGB 12.6 05/28/2022   HCT 37.6 05/28/2022   MCV 97.1 05/28/2022   PLT 272.0 [id number]   Last metabolic panel Lab Results  Component Value Date   GLUCOSE 88 05/28/2022   NA 136 05/28/2022   K 4.3 05/28/2022   CL 98 05/28/2022   CO2 30 05/28/2022   BUN 18 05/28/2022   CREATININE 0.79 05/28/2022   EGFR 79 05/14/2022   CALCIUM 9.3 05/28/2022   PHOS 2.4 (L) 04/21/2022   PROT 7.4 05/28/2022   ALBUMIN 3.9 05/28/2022   BILITOT 0.4 05/28/2022   ALKPHOS 78 05/28/2022   AST 13 05/28/2022   ALT 7 05/28/2022    ANIONGAP 14 04/28/2022    Micro:  Serology:  Imaging: Reviewed: 05/06/22 abd pelv ct Other: Presacral pigtail drain catheter well-positioned in residual gas and fluid collection 3.1 x 1.8 cm (previously 3.2 x 2.8). No new or undrained collections.   Musculoskeletal: Thoracolumbar scoliosis with multilevel  spondylitic change. No acute findings.   IMPRESSION: 1. Interval decrease in size of presacral abscess post percutaneous drainage catheter placement. No new or undrained collections. 2. Single loop of dilated small bowel in the left lower quadrant and pelvis, possibly ileus. Recommend attention on follow-up imaging.    05/21/22 abd/pelv ct 1. Stable sequela of left trans gluteal approach percutaneous drainage catheter placement with resolution pelvic fluid collection and without new definable/drainable fluid collection within the abdomen or pelvis. 2. Stable sequela of sigmoid colectomy and end colostomy creation. 3. Previously noted dilated loop of small bowel within the left lower abdomen has resolved in the interval. No evidence of enteric obstruction. 4. Moderate-to-large amount atherosclerotic plaque within a normal caliber abdominal aorta. Aortic Atherosclerosis (ICD10-I70.0). 5. Suspected hepatic steatosis.  Correlation with LFTs is advised. 6. Redemonstrated suspected fibrotic change within the imaged lung bases without evidence of honeycombing.  Assessment & Plan:   Problem List Items Addressed This Visit       Other   Intra-abdominal abscess ([place]) - Primary     No orders of the defined types were placed in this encounter.    Abx: 1/15-2/29/24 amox-clav   1/10-15 amp-sulbactam   12/24-1/10 piptazo                                                           Assessment: 77 yo female with hypothyroidism, HFpEF, IPF, admitted [id number] for complicated diverticulitis s/p rectosigmoid resection with colostomy on [id number], course complicated by  persistent/worsening intra-abd abscess evident by new fever and 1/6 ct abd pelv    Imaging: 1/6 ct abd pelv previous drain removed; worsening pelvic abscess near rectal stump 9cm   Micro: 1/6 bcx ngtd 1/7 new drain obtained abscess culture - e faecalis, bacteroides species   Procedures: 12/26 rectosigmoid resection; end colostomy creation 1/7 IR ct guided 12 Fr perc drain placement left transgluteal approach into pelvis abscess   IR/surgery involved   I reviewed surgery note from 04/18/22; "recommend outpatient drain study to r/o fistula stump leak in about 2 weeks"    --------- 05/14/22 id clinic assessment 1/31 ir study still with fluid collection but smaller by a little (still 3cm in greatest size); plan repeat ct in 2 weeks 2/15 Tolerating amox-clav Surgery had discussed with her eventual colostomy reversal   Discuss to continue abx until at least a week after drain is removed Continue amox-clav   06/04/22 id clinic assessment Patient had another IR reevaluation including ct scan done on 2/15 which showed no further abscess and drain was removed  Patient feeling well. On her last day abx today  Colostomy take down planned 4/18  Monitor for relapse and let me know  Follow-up: Return if symptoms worsen or fail to improve.      Jabier Mutton, [facility] 06/04/2022, 10:07 AM

## 2022-06-04 NOTE — Patient Instructions (Signed)
If fever, chill, nausea, abdominal pain let me know  Otherwise agree no further antibiotics  No need to set up any schedule unless as above mentioned sign of infection

## 2022-06-05 ENCOUNTER — Other Ambulatory Visit (HOSPITAL_COMMUNITY): Payer: Self-pay | Admitting: Nurse Practitioner

## 2022-06-05 ENCOUNTER — Ambulatory Visit (HOSPITAL_COMMUNITY)
Admission: RE | Admit: 2022-06-05 | Discharge: 2022-06-05 | Disposition: A | Discharge status: Home / Self Care | Payer: Medicare Other | Source: Ambulatory Visit | Attending: Nurse Practitioner | Admitting: Nurse Practitioner

## 2022-06-05 DIAGNOSIS — K9400 Colostomy complication, unspecified: Secondary | ICD-10-CM | POA: Diagnosis not present

## 2022-06-05 DIAGNOSIS — L24B3 Irritant contact dermatitis related to fecal or urinary stoma or fistula: Secondary | ICD-10-CM | POA: Diagnosis not present

## 2022-06-05 DIAGNOSIS — Z933 Colostomy status: Secondary | ICD-10-CM | POA: Diagnosis not present

## 2022-06-05 NOTE — Progress Notes (Signed)
[facility]   Reason for visit:  LLQ colostomy- has been trying to gain more independence with care.  We will perform a pouch change today, I will be stand by assist.  She does not feel her [place] has been helpful for ostomy care HPI:   Past Medical History:  Diagnosis Date   Arthritis    Asthmatic bronchitis with acute exacerbation 02/06/2015   Bronchial pneumonia    Colon polyp    Diverticulitis    DVT (deep venous thrombosis) (HCC)    lower extremity   H/O cardiac catheterization 2004   Normal coronary arteries   Heart murmur    History of stress test 05/21/2011   Hx of echocardiogram 01/27/2010   Normal Ef 55% the transmitral spectral doppler flow pattern is normal for age. the left ventricular wall motion is normal   IPF (idiopathic pulmonary fibrosis) ([place]) 01/2018   Migraines    Pancreatitis    Thyroid disease    TIA (transient ischemic attack)    8 years ago   Family History  Problem Relation Age of Onset   Ovarian cancer Mother    Uterine cancer Mother    Lung cancer Father    HIV Brother        90   Kidney cancer Brother    Lung cancer Brother    Other Brother        Mouth Cancer   Colon cancer Maternal Aunt    Heart disease Maternal Grandmother    Stroke Maternal Grandmother    Hypertension Maternal Grandmother    Diabetes Maternal Grandmother    Arthritis Other    Esophageal cancer Neg Hx    Liver disease Neg Hx    Rectal cancer Neg Hx    Stomach cancer Neg Hx    Allergies  Allergen Reactions   Prednisone Other (See Comments)    Changed personality    Dilaudid [Hydromorphone] Itching and Other (See Comments)    Dilaudid caused marked confusion   Atrovent Hfa [Ipratropium Bromide Hfa] Itching and Other (See Comments)    Pt could not sleep   Cheratussin Ac [Guaifenesin-Codeine] Other (See Comments)    Headaches    Diltiazem Itching and Other (See Comments)    Makes patient sick. Shakes. GI   Hydrocodone Itching   Influenza Vaccines  Other (See Comments)    Pt reports heart attack after last flu shot   Morphine Itching   Motrin [Ibuprofen] Itching and Other (See Comments)    "Gives false reading in blood"   Oxycodone Itching   Codeine Itching, Nausea Only and Other (See Comments)    Pt takes promethazine with codeine at home   Current Outpatient Medications  Medication Sig Dispense Refill Last Dose   ALPRAZolam (XANAX) 0.25 MG tablet Take 1 tablet (0.25 mg total) by mouth 2 (two) times daily as needed for anxiety. 20 tablet 0    AMBULATORY NON FORMULARY MEDICATION Medication Name: Diltiazem 2% with Lidocaine 5%- use index finger, apply small amount of medication inside the rectum up to your first knuckle/joint for 8 weeks 30 g 0    aspirin EC 81 MG tablet Take 81 mg by mouth daily. Swallow whole.      benzonatate (TESSALON) 100 MG capsule Take 200 mg by mouth as needed for cough. (Patient not taking: Reported on 06/04/2022)      furosemide (LASIX) 40 MG tablet Take 1 tablet (40 mg total) by mouth daily as needed (if weight increases by more  than 2 pounds in 1 day or 5 pounds in 1 week.). (Patient not taking: Reported on 06/04/2022) 180 tablet 1    linaclotide (LINZESS) 145 MCG CAPS capsule Take 1 capsule (145 mcg total) by mouth daily before breakfast. 30 capsule 3    ondansetron (ZOFRAN-ODT) 4 MG disintegrating tablet TAKE 1 TABLET BY MOUTH EVERY 8 HOURS AS NEEDED FOR NAUSEA AND VOMITING (Patient taking differently: Take 4 mg by mouth as needed for nausea or vomiting.) 60 tablet 2    potassium chloride SA (KLOR-CON M20) 20 MEQ tablet 1 po qd 90 tablet 2    SYNTHROID 112 MCG tablet TAKE 1 TABLET BY MOUTH DAILY BEFORE BREAKFAST. 90 tablet 0    traZODone (DESYREL) 50 MG tablet TAKE 0.5-1 TABLETS BY MOUTH AT BEDTIME AS NEEDED FOR SLEEP. 90 tablet 2    vitamin C (ASCORBIC ACID) 500 MG tablet Take 500 mg by mouth daily.      No current facility-administered medications for this encounter.   ROS  Review of Systems   Gastrointestinal:        Sacral drain removed and she is much improved LLQ colostomy  Skin:  Positive for rash.       Erythema to peristomal skin  All other systems reviewed and are negative.  Vital signs:  BP 131/68 (BP Location: Right Arm)   Pulse 71   Temp 98 F (36.7 C) (Oral)   Resp 20   SpO2 95%  Exam:  Physical Exam Vitals reviewed.  Constitutional:      Appearance: Normal appearance.  Abdominal:     Palpations: Abdomen is soft.  Skin:    General: Skin is warm and dry.     Findings: Erythema present.  Neurological:     Mental Status: She is alert and oriented to person, place, and time.  Psychiatric:        Mood and Affect: Mood normal.        Behavior: Behavior normal.     Stoma type/location:  LLQ colostomy Stomal assessment/size:  ova;L  1.5 cm x 1.3 cm slightly budded Peristomal assessment:  erythema, small scabbed area at 12 o'clock Treatment options for stomal/peristomal skin: barrier ring 2 piece pouch Output: soft brown stool Ostomy pouching: 2pc. With stoma powder and skin prep and barrier ring Education provided:  We perform pouch change.  I offer to perform this in the bathroom to use a mirror and she prefers to sit on the edge of the bed.   She removes old pouch, cleanses skin.  Measures stoma and cuts barrier to fit.  Applies stoma powder and skin prep.  Flattens ring and applies, followed by pouch and barrier.  SHe is able to smooth and warm pouch to seal and roll closed.  She is greatly improved in her care.  She states reversal will likely be in April.     Impression/dx  colostomy Discussion  She bought her own supplies as [facility] never could obtain supplies as they are obligated to do under medicare. HH is now discharging and I will set her up with [facility].  Likely only for a month.  Plan  See back as needed/    Visit time: 45 minutes.   Domenic Moras FNP-BC

## 2022-06-05 NOTE — Discharge Instructions (Signed)
I will set up with [facility]

## 2022-06-08 ENCOUNTER — Other Ambulatory Visit: Payer: Self-pay

## 2022-06-08 ENCOUNTER — Encounter (HOSPITAL_BASED_OUTPATIENT_CLINIC_OR_DEPARTMENT_OTHER): Payer: Self-pay

## 2022-06-08 ENCOUNTER — Emergency Department (HOSPITAL_BASED_OUTPATIENT_CLINIC_OR_DEPARTMENT_OTHER)
Admission: EM | Admit: 2022-06-08 | Discharge: 2022-06-08 | Disposition: A | Discharge status: Home / Self Care | Payer: Medicare Other | Attending: Emergency Medicine | Admitting: Emergency Medicine

## 2022-06-08 DIAGNOSIS — R110 Nausea: Secondary | ICD-10-CM

## 2022-06-08 DIAGNOSIS — R6883 Chills (without fever): Secondary | ICD-10-CM | POA: Diagnosis not present

## 2022-06-08 DIAGNOSIS — Z7982 Long term (current) use of aspirin: Secondary | ICD-10-CM | POA: Insufficient documentation

## 2022-06-08 DIAGNOSIS — R109 Unspecified abdominal pain: Secondary | ICD-10-CM | POA: Insufficient documentation

## 2022-06-08 DIAGNOSIS — R000 Tachycardia, unspecified: Secondary | ICD-10-CM | POA: Diagnosis not present

## 2022-06-08 DIAGNOSIS — J45909 Unspecified asthma, uncomplicated: Secondary | ICD-10-CM | POA: Diagnosis not present

## 2022-06-08 LAB — COMPREHENSIVE METABOLIC PANEL
ALT: 9 U/L (ref 0–44)
AST: 18 U/L (ref 15–41)
Albumin: 3.2 g/dL — ABNORMAL LOW (ref 3.5–5.0)
Alkaline Phosphatase: 72 U/L (ref 38–126)
Anion gap: 7 (ref 5–15)
BUN: 22 mg/dL (ref 8–23)
CO2: 23 mmol/L (ref 22–32)
Calcium: 8.8 mg/dL — ABNORMAL LOW (ref 8.9–10.3)
Chloride: 104 mmol/L (ref 98–111)
Creatinine, Ser: 0.74 mg/dL (ref 0.44–1.00)
GFR, Estimated: >60 mL/min (ref >60)
Glucose, Bld: 129 mg/dL — ABNORMAL HIGH (ref 70–99)
Potassium: 3.7 mmol/L (ref 3.5–5.1)
Sodium: 134 mmol/L — ABNORMAL LOW (ref 135–145)
Total Bilirubin: 0.6 mg/dL (ref 0.3–1.2)
Total Protein: 6.9 g/dL (ref 6.5–8.1)

## 2022-06-08 LAB — CBC WITH DIFFERENTIAL/PLATELET
Abs Immature Granulocytes: 0.04 10*3/uL (ref 0.00–0.07)
Basophils Absolute: 0.1 10*3/uL (ref 0.0–0.1)
Basophils Relative: 1 %
Eosinophils Absolute: 0.5 10*3/uL (ref 0.0–0.5)
Eosinophils Relative: 5 %
HCT: 35.9 % — ABNORMAL LOW (ref 36.0–46.0)
Hemoglobin: 11.8 g/dL — ABNORMAL LOW (ref 12.0–15.0)
Immature Granulocytes: 0 %
Lymphocytes Relative: 8 %
Lymphs Abs: 0.7 10*3/uL (ref 0.7–4.0)
MCH: 31.9 pg (ref 26.0–34.0)
MCHC: 32.9 g/dL (ref 30.0–36.0)
MCV: 97 fL (ref 80.0–100.0)
Monocytes Absolute: 0.9 10*3/uL (ref 0.1–1.0)
Monocytes Relative: 10 %
Neutro Abs: 7.1 10*3/uL (ref 1.7–7.7)
Neutrophils Relative %: 76 %
Platelets: 212 10*3/uL (ref 150–400)
RBC: 3.7 MIL/uL — ABNORMAL LOW (ref 3.87–5.11)
RDW: 14.6 % (ref 11.5–15.5)
WBC: 9.3 10*3/uL (ref 4.0–10.5)
nRBC: 0 % (ref 0.0–0.2)

## 2022-06-08 LAB — LIPASE, BLOOD: Lipase: 20 U/L (ref 11–51)

## 2022-06-08 LAB — TROPONIN I (HIGH SENSITIVITY): Troponin I (High Sensitivity): 17 ng/L (ref <18)

## 2022-06-08 MED ORDER — ONDANSETRON HCL 4 MG/2ML IJ SOLN
4.0000 mg | Freq: Once | INTRAMUSCULAR | Status: AC
  Administered 2022-06-08: 4 mg via INTRAVENOUS
  Filled 2022-06-08: qty 2

## 2022-06-08 MED ORDER — SODIUM CHLORIDE 0.9 % IV BOLUS
1000.0000 mL | Freq: Once | INTRAVENOUS | Status: AC
  Administered 2022-06-08: 1000 mL via INTRAVENOUS

## 2022-06-08 MED ORDER — ONDANSETRON 8 MG PO TBDP: ORAL_TABLET | ORAL | 0 refills | Status: DC

## 2022-06-08 NOTE — ED Triage Notes (Addendum)
Pt with acute onset nausea and chills. She feels like she needs to throw up, but she can't.  Pt with colostomy in place. CBG 150's. No abd pain.

## 2022-06-08 NOTE — ED Provider Notes (Signed)
[facility] Provider Note   CSN: [id number] Arrival date & time: 06/08/22  0054     History  Chief Complaint  Patient presents with   Nausea    Alexandria [name] is a 77 y.o. female.  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  Patient presenting today for evaluation of nausea.  This started suddenly approximately 2 hours prior to presentation.  She reports that her hands feel "warm, and she cannot get them to cool".  She denies to me she is having any abdominal pain.  She does report normal ostomy outs.  The history is provided by the patient.       Home Medications Prior to Admission medications   Medication Sig Start Date End Date Taking? Authorizing Provider  ALPRAZolam (XANAX) 0.25 MG tablet Take 1 tablet (0.25 mg total) by mouth 2 (two) times daily as needed for anxiety. 05/29/22   [name], DO  AMBULATORY NON FORMULARY MEDICATION Medication Name: Diltiazem 2% with Lidocaine 5%- use index finger, apply small amount of medication inside the rectum up to your first knuckle/joint for 8 weeks 03/20/22   Sharyn Creamer, MD  aspirin EC 81 MG tablet Take 81 mg by mouth daily. Swallow whole.    [provider]  benzonatate (TESSALON) 100 MG capsule Take 200 mg by mouth as needed for cough. Patient not taking: Reported on 06/04/2022 03/06/22   [provider]  furosemide (LASIX) 40 MG tablet Take 1 tablet (40 mg total) by mouth daily as needed (if weight increases by more than 2 pounds in 1 day or 5 pounds in 1 week.). Patient not taking: Reported on 06/04/2022 04/21/22   Vernelle Emerald, MD  linaclotide [facility]) 145 MCG CAPS capsule Take 1 capsule (145 mcg total) by mouth daily before breakfast. 03/17/22   Sharyn Creamer, MD  ondansetron (ZOFRAN-ODT) 4 MG disintegrating tablet  TAKE 1 TABLET BY MOUTH EVERY 8 HOURS AS NEEDED FOR NAUSEA AND VOMITING Patient taking differently: Take 4 mg by mouth as needed for nausea or vomiting. 07/18/21   Carollee Herter, Alferd Apa, DO  potassium chloride SA (KLOR-CON M20) 20 MEQ tablet 1 po qd 04/30/22   Carollee Herter, Kendrick Fries R, DO  SYNTHROID 112 MCG tablet TAKE 1 TABLET BY MOUTH DAILY BEFORE BREAKFAST. 05/27/22   Carollee Herter, Alferd Apa, DO  traZODone (DESYREL) 50 MG tablet TAKE 0.5-1 TABLETS BY MOUTH AT BEDTIME AS NEEDED FOR SLEEP. 05/26/22   Carollee Herter, Alferd Apa, DO  vitamin C (ASCORBIC ACID) 500 MG tablet Take 500 mg by mouth daily.    [provider]      Allergies    Prednisone, Dilaudid [hydromorphone], Atrovent hfa [ipratropium bromide hfa], Cheratussin ac [guaifenesin-codeine], Diltiazem, Hydrocodone, Influenza vaccines, Morphine, Motrin [ibuprofen], Oxycodone, and Codeine    Review of Systems   Review of Systems  All other systems reviewed and are negative.   Physical Exam Updated Vital Signs BP (!) 155/91 (BP Location: Right Arm)   Pulse 71   Temp 98.7 F (37.1 C) (Oral)   Resp 18   Ht '5\' 4"'$  (1.626 m)   Wt 63 kg   SpO2 94%   BMI 23.86 kg/m  Physical Exam Vitals and nursing note reviewed.  Constitutional:      General: She is not in acute distress.    Appearance: She is well-developed. She is  not diaphoretic.  HENT:     Head: Normocephalic and atraumatic.  Cardiovascular:     Rate and Rhythm: Normal rate and regular rhythm.     Heart sounds: No murmur heard.    No friction rub. No gallop.  Pulmonary:     Effort: Pulmonary effort is normal. No respiratory distress.     Breath sounds: Normal breath sounds. No wheezing.  Abdominal:     General: Bowel sounds are normal. There is no distension.     Palpations: Abdomen is soft.     Tenderness: There is no abdominal tenderness.  Musculoskeletal:        General: Normal range of motion.     Cervical back: Normal range of motion and neck supple.  Skin:     General: Skin is warm and dry.  Neurological:     General: No focal deficit present.     Mental Status: She is alert and oriented to person, place, and time.     ED Results / Procedures / Treatments   Labs (all labs ordered are listed, but only abnormal results are displayed) Labs Reviewed - No data to display  EKG None  Radiology No results found.  Procedures Procedures    Medications Ordered in ED Medications - No data to display  ED Course/ Medical Decision Making/ A&P  Patient is a 77 year old female with past medical history as per HPI.  She presents today with complaints of nausea, feeling chilled, and both hands "warm.  Patient arrives here afebrile with stable vital signs.  She is clinically well-appearing and physical examination is unremarkable.  Workup initiated including CBC, metabolic panel, and troponin, all of which are unremarkable.  Patient was given IV saline along with Zofran and her nausea has improved.  She continues to report that her hands feel warm.  I am uncertain as to the etiology of her warm hands, however nothing in the workup appears emergent.  Her abdomen is benign and laboratory studies are reassuring.  Perhaps patient has picked up some sort of GI virus.  I see no indication for admission or imaging studies.  I feel as though patient can safely be discharged with medication for nausea and as needed return.  Final Clinical Impression(s) / ED Diagnoses Final diagnoses:  None    Rx / DC Orders ED Discharge Orders     None         Veryl Speak, MD 06/08/22 0236

## 2022-06-08 NOTE — Discharge Instructions (Signed)
Begin taking Zofran as prescribed as needed for nausea.  Follow-up with primary doctor if not improving in the next few days, and return to the ER if symptoms significantly worsen or change.

## 2022-06-09 ENCOUNTER — Ambulatory Visit (INDEPENDENT_AMBULATORY_CARE_PROVIDER_SITE_OTHER): Payer: Medicare Other | Admitting: Sports Medicine

## 2022-06-09 DIAGNOSIS — S93421A Sprain of deltoid ligament of right ankle, initial encounter: Secondary | ICD-10-CM | POA: Diagnosis not present

## 2022-06-09 DIAGNOSIS — S93401A Sprain of unspecified ligament of right ankle, initial encounter: Secondary | ICD-10-CM | POA: Insufficient documentation

## 2022-06-09 NOTE — Progress Notes (Signed)
    Procedures performed today:    None.  Independent interpretation of notes and tests performed by another provider:   None.  Brief History, Exam, Impression, and Recommendations:    Right ankle sprain Pleasant 77 year old female, proximately a month ago she had a fall and injured her right ankle, had some pain medial ankle, x-rays were negative for fracture, today she is doing a lot better, she has some pain medial distal tibial shaft but this is chronic for her. She has good motion, good strength, only a bit of fullness medially. Ankle is stable, suspect deltoid ligament sprain. Adding home physical therapy, she will get some bilateral lower extremity compression stockings. Return to see me in about 4 weeks.    ____________________________________________ Gwen Her. Dianah Field, M.D., ABFM., CAQSM., AME. Primary Care and Sports Medicine [facility]  Adjunct Professor of [employer]  Risk manager

## 2022-06-09 NOTE — Assessment & Plan Note (Signed)
Pleasant 77 year old female, proximately a month ago she had a fall and injured her right ankle, had some pain medial ankle, x-rays were negative for fracture, today she is doing a lot better, she has some pain medial distal tibial shaft but this is chronic for her. She has good motion, good strength, only a bit of fullness medially. Ankle is stable, suspect deltoid ligament sprain. Adding home physical therapy, she will get some bilateral lower extremity compression stockings. Return to see me in about 4 weeks.

## 2022-06-10 DIAGNOSIS — J4520 Mild intermittent asthma, uncomplicated: Secondary | ICD-10-CM | POA: Diagnosis not present

## 2022-06-10 DIAGNOSIS — Z433 Encounter for attention to colostomy: Secondary | ICD-10-CM | POA: Diagnosis not present

## 2022-06-10 DIAGNOSIS — J84112 Idiopathic pulmonary fibrosis: Secondary | ICD-10-CM | POA: Diagnosis not present

## 2022-06-10 DIAGNOSIS — I5032 Chronic diastolic (congestive) heart failure: Secondary | ICD-10-CM | POA: Diagnosis not present

## 2022-06-10 DIAGNOSIS — I110 Hypertensive heart disease with heart failure: Secondary | ICD-10-CM | POA: Diagnosis not present

## 2022-06-10 DIAGNOSIS — Z48815 Encounter for surgical aftercare following surgery on the digestive system: Secondary | ICD-10-CM | POA: Diagnosis not present

## 2022-06-15 ENCOUNTER — Encounter: Payer: Self-pay | Admitting: Cardiovascular Disease

## 2022-06-15 ENCOUNTER — Ambulatory Visit: Payer: Medicare Other | Attending: Cardiovascular Disease | Admitting: Cardiovascular Disease

## 2022-06-15 VITALS — BP 130/70 | HR 57 | Ht 64.0 in | Wt 140.4 lb

## 2022-06-15 DIAGNOSIS — E7800 Pure hypercholesterolemia, unspecified: Secondary | ICD-10-CM | POA: Diagnosis not present

## 2022-06-15 DIAGNOSIS — I351 Nonrheumatic aortic (valve) insufficiency: Secondary | ICD-10-CM | POA: Diagnosis not present

## 2022-06-15 DIAGNOSIS — R001 Bradycardia, unspecified: Secondary | ICD-10-CM | POA: Insufficient documentation

## 2022-06-15 DIAGNOSIS — I77810 Thoracic aortic ectasia: Secondary | ICD-10-CM | POA: Insufficient documentation

## 2022-06-15 DIAGNOSIS — R011 Cardiac murmur, unspecified: Secondary | ICD-10-CM | POA: Insufficient documentation

## 2022-06-15 DIAGNOSIS — I5032 Chronic diastolic (congestive) heart failure: Secondary | ICD-10-CM | POA: Insufficient documentation

## 2022-06-15 DIAGNOSIS — I700 Atherosclerosis of aorta: Secondary | ICD-10-CM | POA: Insufficient documentation

## 2022-06-15 DIAGNOSIS — I4510 Unspecified right bundle-branch block: Secondary | ICD-10-CM | POA: Insufficient documentation

## 2022-06-15 LAB — LIPID PANEL
Chol/HDL Ratio: 4.4 ratio (ref 0.0–4.4)
Cholesterol, Total: 183 mg/dL (ref 100–199)
HDL: 42 mg/dL (ref >39)
LDL Chol Calc (NIH): 123 mg/dL — ABNORMAL HIGH (ref 0–99)
Triglycerides: 98 mg/dL (ref 0–149)
VLDL Cholesterol Cal: 18 mg/dL (ref 5–40)

## 2022-06-15 NOTE — Progress Notes (Signed)
Cardiology Office Note:    Date:  06/18/2022   ID:  ARZELLA SALGUEIRO, DOB Dec 07, 1945, MRN [id number]  PCP:  Carollee Herter, [place] Prov[facility] Cardiologist:  Berniece Salines, DO     Referring MD: Carollee Herter, Alferd Apa, *   Chief Complaint  Patient presents with   Consult  Alexandria [name] is a 77 y.o. female who is being seen today for the evaluation of bradycardia at the request of [name], *.   History of Present Illness:    Alexandria [name] is a 77 y.o. female with a hx of colostomy following partial colectomy for severe diverticulitis, interstitial lung disease, chronic venous insufficiency of the lower extremities, bronchial asthma, hypothyroidism, reported diagnosis of diastolic heart failure referred for incidental bradycardia.  She has had a long recovery from her abdominal infection and surgery with complicated diverticulitis in December.  She had severe sigmoid diverticulosis with a pericolic fluid collection consistent with diverticular abscess.  She had rectosigmoid resection with colostomy performed by Dr. [name] 03/31/2022.  She had problems with new abscess formation that required interventional radiology guided drainage on 04/12/2022.  Prolonged ileus led to need for TPN and she received transfusions for anemia.  She is planning takedown of her colostomy, possibly next month.  At an appointment with her PCP in late February she was found to have significant bradycardia with a heart rate of only 48 bpm, although she was not particularly symptomatic.  She denies syncope.  She had been troubled by dizziness, but the symptoms improved when she stopped taking lisinopril.  She denies palpitations, orthopnea, PND, severe lower extremity edema or chest pain.  She does notice mild swelling limited to her left leg.  She rarely takes furosemide 20 mg daily as needed for weight gain.  She is on Ofev for interstitial lung disease.  In 2021  a 30-day event monitor for complaints of dizziness showed occasional episodes of brief paroxysmal atrial tachycardia but did not show any meaningful bradycardia or AV block.  It was also no evidence of atrial fibrillation.  Coronary angiography in 2015 did not show any evidence of stenosis.  More recently coronary CTA showed a calcium score of 0 although she did have some mild aortic atherosclerosis.  Echocardiogram performed in March 2022 showed mild dilation of the ascending aorta at only 4.0 cm confirmed on CT to be 4.1 cm)..  Left ventricular systolic function was normal and there was mention of "grade 1 diastolic dysfunction (impaired relaxation)".  The mitral annular diastolic velocities were quite low at 2.5 and 2.8 cm/s.  There was aortic valve sclerosis without stenosis.  Past Medical History:  Diagnosis Date   Arthritis    Asthmatic bronchitis with acute exacerbation 02/06/2015   Bronchial pneumonia    Colon polyp    Diverticulitis    DVT (deep venous thrombosis) (HCC)    lower extremity   H/O cardiac catheterization 2004   Normal coronary arteries   Heart murmur    History of stress test 05/21/2011   Hx of echocardiogram 01/27/2010   Normal Ef 55% the transmitral spectral doppler flow pattern is normal for age. the left ventricular wall motion is normal   IPF (idiopathic pulmonary fibrosis) ([place]) 01/2018   Migraines    Pancreatitis    Thyroid disease    TIA (transient ischemic attack)    8 years ago    Past Surgical History:  Procedure Laterality Date   BREAST BIOPSY  [facility]   CARDIAC CATHETERIZATION     10/2013   CATARACT EXTRACTION Right 03/22/2018   LEFT HEART CATHETERIZATION WITH CORONARY ANGIOGRAM N/A 10/25/2013   Procedure: LEFT HEART CATHETERIZATION WITH CORONARY ANGIOGRAM;  Surgeon: Troy Sine, MD;  Location[facility] C[facility] LAB;  Service: Cardiovascular;  Laterality: N/A;   NECK SURGERY     x's 2   TOTAL KNEE ARTHROPLASTY     Bilateral x's 2   VAGINAL  HYSTERECTOMY  10/17/1998   [name]   VIDEO BRONCHOSCOPY Bilateral 01/24/2018   Procedure: VIDEO BRONCHOSCOPY WITH FLUORO;  Surgeon: [name], MD;  Location: [place];  Service: Cardiopulmonary;  Laterality: Bilateral;   XI ROBOTIC ASSISTED LOWER ANTERIOR RESECTION N/A 03/31/2022   Procedure: XI ROBOTIC ASSISTED LOWER ANTERIOR RESECTION;  Surgeon: Michael Boston, MD;  Location: [facility] ORS;  Service: General;  Laterality: N/A;    Current Medications: Current Meds  Medication Sig   aspirin EC 81 MG tablet Take 81 mg by mouth daily. Swallow whole.   SYNTHROID 112 MCG tablet TAKE 1 TABLET BY MOUTH DAILY BEFORE BREAKFAST.   vitamin C (ASCORBIC ACID) 500 MG tablet Take 500 mg by mouth daily.   [DISCONTINUED] potassium chloride SA (KLOR-CON M20) 20 MEQ tablet 1 po qd     Allergies:   Prednisone, Dilaudid [hydromorphone], Atrovent hfa [ipratropium bromide hfa], Cheratussin ac [guaifenesin-codeine], Diltiazem, Hydrocodone, Influenza vaccines, Morphine, Motrin [ibuprofen], Oxycodone, and Codeine   Social History   Socioeconomic History   Marital status: Married    Spouse name: [name]   Number of children: 3   Years of education: Jr college   Highest education level: Not on file  Occupational History   Occupation: Tour manager: UNEMPLOYED   Occupation: retired  Tobacco Use   Smoking status: Never   Smokeless tobacco: Never  Scientific laboratory technician Use: Never used  Substance and Sexual Activity   Alcohol use: No    Alcohol/week: 0.0 standard drinks of alcohol   Drug use: No   Sexual activity: Not on file  Other Topics Concern   Not on file  Social History Narrative   Lives with husband [name]   Caffeine use: coffee (2 cups per day)   Mostly right-handed   Social Determinants of Health   Financial Resource Strain: Not on file  Food Insecurity: No Food Insecurity (03/31/2022)   Hunger Vital Sign    Worried About Running Out of Food in the Last Year: Never true    Ran  Out of Food in the Last Year: Never true  Transportation Needs: No Transportation Needs (03/31/2022)   PRAPARE - Hydrologist (Medical): No    Lack of Transportation (Non-Medical): No  Physical Activity: Not on file  Stress: Not on file  Social Connections: Not on file     Family History: The patient's family history includes Arthritis in an other family member; Colon cancer in her maternal aunt; Diabetes in her maternal grandmother; HIV in her brother; Heart disease in her maternal grandmother; Hypertension in her maternal grandmother; Kidney cancer in her brother; Lung cancer in her brother and father; Other in her brother; Ovarian cancer in her mother; Stroke in her maternal grandmother; Uterine cancer in her mother. There is no history of Esophageal cancer, Liver disease, Rectal cancer, or Stomach cancer.  ROS:   Please see the history of present illness.     All other systems reviewed and are negative.  EKGs/Labs/Other Studies Reviewed:  The following studies were reviewed today:  Coronary CT angiogram 10/06/2019  1. Coronary calcium score of 0. This was 0 percentile for age and sex matched control.   2. Normal coronary origin with right dominance.   3. No evidence of CAD.   4.  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   5.  Mild mitral annular calcification.   5.  Aortic Atherosclerosis.    Arrhythmia monitor 01/12/2020  Conclusion: This study is remarkable for supraventricular tachycardia which is likely atrial tachycardia with variable block.   Echocardiogram 06/19/2020    1. Sigmoid septum. Left ventricular ejection fraction, by estimation, is  60 to 65%. The left ventricle has normal function. The left ventricle has  no regional wall motion abnormalities. Left ventricular diastolic  parameters are consistent with Grade I  diastolic dysfunction (impaired relaxation).   2. Right ventricular systolic function is normal. The  right ventricular  size is normal. There is normal pulmonary artery systolic pressure.   3. The mitral valve is normal in structure. Mild mitral valve  regurgitation. No evidence of mitral stenosis.   4. The aortic valve is normal in structure. There is mild calcification  of the aortic valve. There is mild thickening of the aortic valve. Aortic  valve regurgitation is mild. No aortic stenosis is present.   5. There is moderate dilatation of the ascending aorta, measuring 40 mm.   6. The inferior vena cava is normal in size with greater than 50%  respiratory variability, suggesting right atrial pressure of 3 mmHg.   EKG:  EKG is not ordered today.  The ekg ordered 06/08/22 demonstrates sinus rhythm with frequent PACs and a rate over 90 bpm.  She has right bundle branch block. The ECG from 05/28/2022 shows sinus rhythm with frequent PACs in a pattern of trigeminy and right bundle branch block. ECG from 03/19/2022 shows sinus rhythm and bifascicular block (RBBB plus LAFB)  Recent Labs: 01/02/2022: TSH 0.99 04/21/2022: Magnesium 2.1 06/08/2022: ALT 9; BUN 22; Creatinine, Ser 0.74; Hemoglobin 11.8; Platelets 212; Potassium 3.7; Sodium 134  Recent Lipid Panel    Component Value Date/Time   CHOL 183 06/15/2022 0956   TRIG 98 06/15/2022 0956   HDL 42 06/15/2022 0956   CHOLHDL 4.4 06/15/2022 0956   CHOLHDL 3 03/18/2020 0823   VLDL 18.2 03/18/2020 0823   LDLCALC 123 (H) 06/15/2022 0956   LDLCALC 136 (H) 11/30/2019 0937     Risk Assessment/Calculations:            Physical Exam:    VS:  BP 130/70 (BP Location: Left Arm, Patient Position: Sitting, Cuff Size: Normal)   Pulse (!) 57   Ht '5\' 4"'$  (1.626 m)   Wt 140 lb 6.4 oz (63.7 kg)   SpO2 96%   BMI 24.10 kg/m     Wt Readings from Last 3 Encounters:  06/16/22 140 lb 9.6 oz (63.8 kg)  06/15/22 140 lb 6.4 oz (63.7 kg)  06/08/22 139 lb (63 kg)     GEN: Appears well, well nourished, well developed in no acute distress HEENT:  Normal NECK: No JVD; No carotid bruits LYMPHATICS: No lymphadenopathy CARDIAC: Frequent isolated ectopic beats on the background of RRR, grade 2-3/6 aortic ejection murmur, grade 1-2/6 decrescendo diastolic murmur suggesting aortic insufficiency, no rubs, gallops RESPIRATORY:  Clear to auscultation without rales, wheezing or rhonchi  ABDOMEN: Soft, non-tender, non-distended MUSCULOSKELETAL:  No edema; No deformity  SKIN: Warm and dry NEUROLOGIC:  Alert and oriented x 3 PSYCHIATRIC:  Normal affect   ASSESSMENT:    1. Bradycardia   2. RBBB   3. Chronic diastolic CHF (congestive heart failure) ([place])   4. Nonrheumatic aortic valve insufficiency   5. Ascending aorta dilatation (HCC)   6. Murmur   7. Atherosclerosis of aorta ([place])   8. Hypercholesterolemia    PLAN:    In order of problems listed above:  Bradycardia: Suspect that this is artifactual and related to "under counting" of premature beats when checking the peripheral pulse with an automatic blood pressure cuff for direct palpation of the pulse or pulse oximeter.  She has not had symptoms of bradycardia.  There is no EKG documentation of bradycardia during her prolonged hospitalization or multiple subsequent ER visits and ECGs.  A few years ago and lengthy arrhythmia monitor did not show any problems of bradycardia or AV block. RBBB: Chronic longstanding abnormality, sometimes with left axis deviation due to left anterior fascicular block.  There has been no documentation of second-degree or third-degree AV block.  Would avoid medications with negative chronotropic effect such as beta-blockers or centrally acting calcium channel blockers CHF: She requires very little diuretic therapy if any. Appears clinically euvolemic today. Stop furosemide and potassium supplement. Echo does show evidence of diastolic dysfunction. Would avoid SGLT2 inhibitors due to upcoming colostomy takedown and previous problems with pelvic infections. Breathing  problems more likely related to ILD, but encouraged low sodium diet and daily weight monitoring. AI: The murmur is louder than expected based on echo findings.  Due to aortoannular ectasia. Mildly dilated ascending aorta at 41 mm. Repeat echo. HLP: update lipid profile. Ca score zero, but does have some aortic atherosclerosis.           Medication Adjustments/Labs and Tests Ordered: Current medicines are reviewed at length with the patient today.  Concerns regarding medicines are outlined above.  Orders Placed This Encounter  Procedures   Lipid panel   ECHOCARDIOGRAM COMPLETE   No orders of the defined types were placed in this encounter.   Patient Instructions  Medication Instructions:  Discontinue Lasix and Potassium *If you need a refill on your cardiac medications before your next appointment, please call your pharmacy*   Lab Work: Lipids- today If you have labs (blood work) drawn today and your tests are completely normal, you will receive your results only by: [place] (if you have MyChart) OR A paper copy in the mail If you have any lab test that is abnormal or we need to change your treatment, we will call you to review the results.   Testing/Procedures: Your physician has requested that you have an echocardiogram. Echocardiography is a painless test that uses sound waves to create images of your heart. It provides your doctor with information about the size and shape of your heart and how well your heart's chambers and valves are working. This procedure takes approximately one hour. There are no restrictions for this procedure. Please do NOT wear cologne, perfume, aftershave, or lotions (deodorant is allowed). Please arrive 15 minutes prior to your appointment time.    Follow-Up: At [facility], you and your health needs are our priority.  As part of our continuing mission to provide you with exceptional heart care, we have created designated Provider  Care Teams.  These Care Teams include your primary Cardiologist (physician) and Advanced Practice Providers (APPs -  Physician Assistants and Nurse Practitioners) who all work together to provide you with the care you need, when you need it.  We recommend signing up for the patient portal called "MyChart".  Sign up information is provided on this After Visit Summary.  MyChart is used to connect with patients for Virtual Visits (Telemedicine).  Patients are able to view lab/test results, encounter notes, upcoming appointments, etc.  Non-urgent messages can be sent to your provider as well.   To learn more about what you can do with MyChart, go to [email or web address].    Your next appointment:    Follow up as needed    Signed, Sanda Klein, MD  06/18/2022 3:16 PM    [place]

## 2022-06-15 NOTE — Patient Instructions (Addendum)
Medication Instructions:  Discontinue Lasix and Potassium *If you need a refill on your cardiac medications before your next appointment, please call your pharmacy*   Lab Work: Lipids- today If you have labs (blood work) drawn today and your tests are completely normal, you will receive your results only by: [place] (if you have MyChart) OR A paper copy in the mail If you have any lab test that is abnormal or we need to change your treatment, we will call you to review the results.   Testing/Procedures: Your physician has requested that you have an echocardiogram. Echocardiography is a painless test that uses sound waves to create images of your heart. It provides your doctor with information about the size and shape of your heart and how well your heart's chambers and valves are working. This procedure takes approximately one hour. There are no restrictions for this procedure. Please do NOT wear cologne, perfume, aftershave, or lotions (deodorant is allowed). Please arrive 15 minutes prior to your appointment time.    Follow-Up: At [facility], you and your health needs are our priority.  As part of our continuing mission to provide you with exceptional heart care, we have created designated Provider Care Teams.  These Care Teams include your primary Cardiologist (physician) and Advanced Practice Providers (APPs -  Physician Assistants and Nurse Practitioners) who all work together to provide you with the care you need, when you need it.  We recommend signing up for the patient portal called "MyChart".  Sign up information is provided on this After Visit Summary.  MyChart is used to connect with patients for Virtual Visits (Telemedicine).  Patients are able to view lab/test results, encounter notes, upcoming appointments, etc.  Non-urgent messages can be sent to your provider as well.   To learn more about what you can do with MyChart, go to [email or web address].     Your next appointment:    Follow up as needed

## 2022-06-16 ENCOUNTER — Ambulatory Visit (INDEPENDENT_AMBULATORY_CARE_PROVIDER_SITE_OTHER): Payer: Medicare Other | Admitting: Family Medicine

## 2022-06-16 ENCOUNTER — Encounter: Payer: Self-pay | Admitting: Family Medicine

## 2022-06-16 VITALS — BP 128/80 | HR 70 | Temp 97.8°F | Resp 18 | Ht 64.0 in | Wt 140.6 lb

## 2022-06-16 DIAGNOSIS — E669 Obesity, unspecified: Secondary | ICD-10-CM

## 2022-06-16 DIAGNOSIS — I714 Abdominal aortic aneurysm, without rupture, unspecified: Secondary | ICD-10-CM

## 2022-06-16 DIAGNOSIS — M316 Other giant cell arteritis: Secondary | ICD-10-CM | POA: Diagnosis not present

## 2022-06-16 DIAGNOSIS — J9801 Acute bronchospasm: Secondary | ICD-10-CM | POA: Diagnosis not present

## 2022-06-16 DIAGNOSIS — J0140 Acute pansinusitis, unspecified: Secondary | ICD-10-CM | POA: Diagnosis not present

## 2022-06-16 DIAGNOSIS — J440 Chronic obstructive pulmonary disease with acute lower respiratory infection: Secondary | ICD-10-CM

## 2022-06-16 DIAGNOSIS — J9601 Acute respiratory failure with hypoxia: Secondary | ICD-10-CM | POA: Diagnosis not present

## 2022-06-16 DIAGNOSIS — I5032 Chronic diastolic (congestive) heart failure: Secondary | ICD-10-CM | POA: Diagnosis not present

## 2022-06-16 DIAGNOSIS — I201 Angina pectoris with documented spasm: Secondary | ICD-10-CM | POA: Insufficient documentation

## 2022-06-16 DIAGNOSIS — E1169 Type 2 diabetes mellitus with other specified complication: Secondary | ICD-10-CM | POA: Diagnosis not present

## 2022-06-16 DIAGNOSIS — Z433 Encounter for attention to colostomy: Secondary | ICD-10-CM | POA: Diagnosis not present

## 2022-06-16 DIAGNOSIS — J84112 Idiopathic pulmonary fibrosis: Secondary | ICD-10-CM | POA: Diagnosis not present

## 2022-06-16 DIAGNOSIS — J4520 Mild intermittent asthma, uncomplicated: Secondary | ICD-10-CM | POA: Diagnosis not present

## 2022-06-16 DIAGNOSIS — I110 Hypertensive heart disease with heart failure: Secondary | ICD-10-CM | POA: Diagnosis not present

## 2022-06-16 DIAGNOSIS — Z48815 Encounter for surgical aftercare following surgery on the digestive system: Secondary | ICD-10-CM | POA: Diagnosis not present

## 2022-06-16 MED ORDER — PROMETHAZINE-DM 6.25-15 MG/5ML PO SYRP: 5.0000 mL | ORAL_SOLUTION | Freq: Four times a day (QID) | ORAL | 0 refills | Status: DC | PRN

## 2022-06-16 MED ORDER — AMOXICILLIN-POT CLAVULANATE 875-125 MG PO TABS: 1.0000 | ORAL_TABLET | Freq: Two times a day (BID) | ORAL | 0 refills | Status: DC

## 2022-06-16 NOTE — Assessment & Plan Note (Signed)
Augmentin and cough med F/u as needed Use saline spray

## 2022-06-16 NOTE — Progress Notes (Signed)
Established Patient Office Visit  Subjective   Patient ID: Alexandria [name], female    DOB: November 24, 1945  Age: 77 y.o. MRN: [id number]  Chief Complaint  Patient presents with  . Sinus Problem    Sxs started x1 week ago, pt states having cough, congestion, sneezing.     HPI Pt here c/o sinus congestion x 1 week and coughing.  No fevers + chills.  Cough is productive + sinus pressure/ headaches.  Ears feel clogged   Patient Active Problem List   Diagnosis Date Noted  . Right ankle sprain 06/09/2022  . Sprain of right ankle 06/01/2022  . Dizziness 06/01/2022  . Rectal abscess 06/01/2022  . Acute right ankle pain 05/19/2022  . Irritant contact dermatitis associated with fecal stoma 05/02/2022  . Slow transit constipation 05/02/2022  . Colostomy complication ([place]) [id number]  . IDA (iron deficiency anemia) 04/19/2022  . Intra-abdominal abscess ([place]) 04/18/2022  . Acute blood loss anemia 04/17/2022  . Chronic diastolic CHF (congestive heart failure) ([place]) 04/17/2022  . Toxic metabolic encephalopathy 0000000  . Ileus following gastrointestinal surgery ([place]) 04/17/2022  . Pelvic abscess in female 03/30/2022  . Flat foot 11/19/2021  . S/P total knee arthroplasty, left 11/13/2021  . Ankle impingement syndrome, left 11/13/2021  . Rectal bleeding 08/22/2021  . Abnormal thyroid blood test 08/22/2021  . Abnormal kidney function 08/22/2021  . Dumping syndrome 03/13/2020  . Arthritis   . Bronchial pneumonia   . Colon polyp   . DVT (deep venous thrombosis) ([place])   . Heart murmur   . Migraines   . Pancreatitis   . Thyroid disease   . Aortic root dilatation ([place]) 11/24/2019  . Leukocytosis 11/06/2019  . Diarrhea 11/06/2019  . Generalized abdominal pain 11/06/2019  . Pre-ulcerative corn or callous 09/07/2019  . Nausea 09/07/2019  . Pain of left calf 05/09/2019  . Tibial pain 05/09/2019  . Essential hypertension 02/08/2019  . Healthcare maintenance 11/10/2018  . Bradycardia  07/22/2018  . Pansinusitis 06/23/2018  . History of transient ischemic attack (TIA) 03/24/2018  . IPF (idiopathic pulmonary fibrosis) ([place]) 03/24/2018  . Allergic rhinitis 02/16/2018  . Eustachian tube dysfunction, bilateral 02/16/2018  . URI (upper respiratory infection) 02/16/2018  . Seasonal allergic rhinitis due to pollen 02/16/2018  . ILD (interstitial lung disease) ([place]) 01/07/2018  . Dyspnea 01/07/2018  . Acute respiratory failure ([place]) 01/06/2018  . TIA (transient ischemic attack) 01/06/2018  . Hypokalemia 12/15/2017  . Chronic cough 11/26/2017  . Basal cell carcinoma (BCC) of neck 06/07/2017  . Anisometropia 06/02/2017  . Drusen of macula of both eyes 06/02/2017  . Photopsia of left eye 06/02/2017  . Cardiac murmur 05/07/2017  . Situational anxiety 05/07/2017  . Headache 05/07/2017  . Mitral and aortic insufficiency 05/07/2017  . Migraine without status migrainosus, not intractable 06/09/2016  . Insomnia 11/18/2015  . Normal coronary arteries 05/20/2015  . RBBB 05/20/2015  . Abnormal CT of the chest 05/20/2015  . Hx of myocardial infarction 05/08/2015  . Bruising 05/08/2015  . Upper airway cough syndrome 02/14/2015  . Asthmatic bronchitis with acute exacerbation 02/06/2015  . Mild intermittent asthma without complication [id number]  . Oliguria 12/28/2014  . Hypothyroidism 10/25/2014  . Fatigue 10/25/2014  . 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 Chronic venous insufficiency 10/24/2014  . Varicose veins of leg with complications [id number]  . Degeneration of intervertebral disc of cervical region 06/29/2014  . 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 Generalized osteoarthritis of multiple sites 06/29/2014  . History of stroke without  residual deficits 06/29/2014  . Palpitations 11/08/2013  . Edema of lower extremity 10/03/2013  . Hair loss 11/25/2012  . Alopecia 11/25/2012  . Diverticulitis of large intestine with perforation and abscess  07/14/2012  . Hammer toe 06/13/2012  . History of stress test 05/21/2011  . Hx of echocardiogram 01/27/2010  . H/O cardiac catheterization 2004   Past Medical History:  Diagnosis Date  . Arthritis   . Asthmatic bronchitis with acute exacerbation 02/06/2015  . Bronchial pneumonia   . Colon polyp   . Diverticulitis   . DVT (deep venous thrombosis) ([place])    lower extremity  . H/O cardiac catheterization 2004   Normal coronary arteries  . Heart murmur   . History of stress test 05/21/2011  . Hx of echocardiogram 01/27/2010   Normal Ef 55% the transmitral spectral doppler flow pattern is normal for age. the left ventricular wall motion is normal  . IPF (idiopathic pulmonary fibrosis) ([place]) 01/2018  . Migraines   . Pancreatitis   . Thyroid disease   . TIA (transient ischemic attack)    8 years ago   Past Surgical History:  Procedure Laterality Date  . BREAST BIOPSY     [facility]  . CARDIAC CATHETERIZATION     10/2013  . CATARACT EXTRACTION Right 03/22/2018  . LEFT HEART CATHETERIZATION WITH CORONARY ANGIOGRAM N/A 10/25/2013   Procedure: LEFT HEART CATHETERIZATION WITH CORONARY ANGIOGRAM;  Surgeon: Troy Sine, MD;  Location: [facility] CATH LAB;  Service: Cardiovascular;  Laterality: N/A;  . NECK SURGERY     x's 2  . TOTAL KNEE ARTHROPLASTY     Bilateral x's 2  . VAGINAL HYSTERECTOMY  10/17/1998   [place] BRONCHOSCOPY Bilateral 01/24/2018   Procedure: VIDEO BRONCHOSCOPY WITH FLUORO;  Surgeon: [name], MD;  Location: [facility];  Service: Cardiopulmonary;  Laterality: Bilateral;  . XI ROBOTIC ASSISTED LOWER ANTERIOR RESECTION N/A 03/31/2022   Procedure: XI ROBOTIC ASSISTED LOWER ANTERIOR RESECTION;  Surgeon: Michael Boston, MD;  Location: [facility] ORS;  Service: General;  Laterality: N/A;   Social History   Tobacco Use  . Smoking status: Never  . Smokeless tobacco: Never  Vaping Use  . Vaping Use: Never used  Substance Use Topics  . Alcohol use: No     Alcohol/week: 0.0 standard drinks of alcohol  . Drug use: No   Social History   Socioeconomic History  . Marital status: Married    Spouse name: [name]  . Number of children: 3  . Years of education: [employer]  . Highest education level: Not on file  Occupational History  . Occupation: DISABLED    Employer: UNEMPLOYED  . Occupation: retired  Tobacco Use  . Smoking status: Never  . Smokeless tobacco: Never  Vaping Use  . Vaping Use: Never used  Substance and Sexual Activity  . Alcohol use: No    Alcohol/week: 0.0 standard drinks of alcohol  . Drug use: No  . Sexual activity: Not on file  Other Topics Concern  . Not on file  Social History Narrative   Lives with husband [name]   Caffeine use: coffee (2 cups per day)   Mostly right-handed   Social Determinants of Health   Financial Resource Strain: Not on file  Food Insecurity: No Food Insecurity (03/31/2022)   Hunger Vital Sign   . Worried About Charity fundraiser in the Last Year: Never true   . Ran Out of Food in the Last Year: Never true  Transportation Needs: No Transportation Needs (03/31/2022)   PRAPARE - Transportation   . Lack of Transportation (Medical): No   . Lack of Transportation (Non-Medical): No  Physical Activity: Not on file  Stress: Not on file  Social Connections: Not on file  Intimate Partner Violence: Not At Risk (03/31/2022)   Humiliation, Afraid, Rape, and Kick questionnaire   . Fear of Current or Ex-Partner: No   . Emotionally Abused: No   . Physically Abused: No   . Sexually Abused: No   Family Status  Relation Name Status  . Mother  Deceased  . Father  Deceased  . Brother  (Not Specified)  . Brother  (Not Specified)  . Brother  (Not Specified)  . Brother  (Not Specified)  . Brother  (Not Specified)  . Mat Aunt  (Not Specified)  . MGM  (Not Specified)  . Other  (Not Specified)  . Neg Hx  (Not Specified)   Family History  Problem Relation Age of Onset  . Ovarian cancer Mother    . Uterine cancer Mother   . Lung cancer Father   . HIV Brother        44  . Kidney cancer Brother   . Lung cancer Brother   . Other Brother        Mouth Cancer  . Colon cancer Maternal Aunt   . Heart disease Maternal Grandmother   . Stroke Maternal Grandmother   . Hypertension Maternal Grandmother   . Diabetes Maternal Grandmother   . Arthritis Other   . Esophageal cancer Neg Hx   . Liver disease Neg Hx   . Rectal cancer Neg Hx   . Stomach cancer Neg Hx    Allergies  Allergen Reactions  . Prednisone Other (See Comments)    Changed personality   . Dilaudid [Hydromorphone] Itching and Other (See Comments)    Dilaudid caused marked confusion  . Atrovent Hfa [Ipratropium Bromide Hfa] Itching and Other (See Comments)    Pt could not sleep  . Cheratussin Ac [Guaifenesin-Codeine] Other (See Comments)    Headaches   . Diltiazem Itching and Other (See Comments)    Makes patient sick. Shakes. GI  . Hydrocodone Itching  . Influenza Vaccines Other (See Comments)    Pt reports heart attack after last flu shot  . Morphine Itching  . Motrin [Ibuprofen] Itching and Other (See Comments)    "Gives false reading in blood"  . Oxycodone Itching  . Codeine Itching, Nausea Only and Other (See Comments)    Pt takes promethazine with codeine at home      Review of Systems  Constitutional:  Positive for chills. Negative for fever and malaise/fatigue.  HENT:  Positive for congestion, ear pain and sinus pain. Negative for ear discharge, hearing loss, nosebleeds and sore throat.   Eyes:  Negative for discharge.  Respiratory:  Positive for cough, sputum production and wheezing. Negative for shortness of breath.   Cardiovascular:  Negative for chest pain, palpitations and leg swelling.  Gastrointestinal:  Negative for abdominal pain, blood in stool, constipation, diarrhea, heartburn, nausea and vomiting.  Genitourinary:  Negative for dysuria, frequency, hematuria and urgency.   Musculoskeletal:  Negative for back pain, falls and myalgias.  Skin:  Negative for rash.  Neurological:  Negative for dizziness, sensory change, loss of consciousness, weakness and headaches.  Endo/Heme/Allergies:  Negative for environmental allergies. Does not bruise/bleed easily.  Psychiatric/Behavioral:  Negative for depression and suicidal ideas. The patient is not nervous/anxious and does not  have insomnia.      Objective:     BP 128/80 (BP Location: Left Arm, Patient Position: Sitting, Cuff Size: Normal)   Pulse 70   Temp 97.8 F (36.6 C) (Oral)   Resp 18   Ht '5\' 4"'$  (1.626 m)   Wt 140 lb 9.6 oz (63.8 kg)   SpO2 94%   BMI 24.13 kg/m  BP Readings from Last 3 Encounters:  06/16/22 128/80  06/15/22 130/70  06/08/22 (!) 138/56   Wt Readings from Last 3 Encounters:  06/16/22 140 lb 9.6 oz (63.8 kg)  06/15/22 140 lb 6.4 oz (63.7 kg)  06/08/22 139 lb (63 kg)   SpO2 Readings from Last 3 Encounters:  06/16/22 94%  06/15/22 96%  06/08/22 93%      Physical Exam Vitals and nursing note reviewed.  Constitutional:      Appearance: She is well-developed.  HENT:     Right Ear: External ear normal. A middle ear effusion is present.     Left Ear: External ear normal. A middle ear effusion is present.     Nose:     Right Sinus: Maxillary sinus tenderness present.     Left Sinus: Maxillary sinus tenderness present.     Mouth/Throat:     Mouth: Mucous membranes are moist.     Pharynx: Oropharynx is clear. No oropharyngeal exudate or posterior oropharyngeal erythema.  Eyes:     General:        Right eye: No discharge.        Left eye: No discharge.     Conjunctiva/sclera: Conjunctivae normal.  Cardiovascular:     Rate and Rhythm: Normal rate and regular rhythm.     Heart sounds: Normal heart sounds. No murmur heard. Pulmonary:     Effort: Pulmonary effort is normal. No respiratory distress.     Breath sounds: Wheezing present. No rales.  Chest:     Chest wall: No  tenderness.  Musculoskeletal:     Cervical back: Normal range of motion.  Lymphadenopathy:     Cervical: Cervical adenopathy present.  Neurological:     Mental Status: She is alert and oriented to person, place, and time.     No results found for any visits on 06/16/22.  Last CBC Lab Results  Component Value Date   WBC 9.3 06/08/2022   HGB 11.8 (L) 06/08/2022   HCT 35.9 (L) 06/08/2022   MCV 97.0 06/08/2022   MCH 31.9 06/08/2022   RDW 14.6 06/08/2022   PLT 212 [id number]   Last metabolic panel Lab Results  Component Value Date   GLUCOSE 129 (H) 06/08/2022   NA 134 (L) 06/08/2022   K 3.7 06/08/2022   CL 104 06/08/2022   CO2 23 06/08/2022   BUN 22 06/08/2022   CREATININE 0.74 06/08/2022   GFRNONAA >60 06/08/2022   CALCIUM 8.8 (L) 06/08/2022   PHOS 2.4 (L) 04/21/2022   PROT 6.9 06/08/2022   ALBUMIN 3.2 (L) 06/08/2022   BILITOT 0.6 06/08/2022   ALKPHOS 72 06/08/2022   AST 18 06/08/2022   ALT 9 06/08/2022   ANIONGAP 7 06/08/2022   Last lipids Lab Results  Component Value Date   CHOL 183 06/15/2022   HDL 42 06/15/2022   LDLCALC 123 (H) 06/15/2022   TRIG 98 06/15/2022   CHOLHDL 4.4 06/15/2022   Last hemoglobin A1c Lab Results  Component Value Date   HGBA1C 5.4 04/05/2022   Last thyroid functions Lab Results  Component Value Date   TSH 0.99  01/02/2022   T4TOTAL 11.0 08/22/2021   Last vitamin D Lab Results  Component Value Date   VD25OH 29.9 (L) 05/09/2019   Last vitamin B12 and Folate Lab Results  Component Value Date   VITAMINB12 640 04/18/2022   FOLATE 10.8 04/18/2022      The ASCVD Risk score (Arnett DK, et al., 2019) failed to calculate for the following reasons:   The patient has a prior MI or stroke diagnosis    Assessment & Plan:   Problem List Items Addressed This Visit   None   No follow-ups on file.    [name], DO

## 2022-06-16 NOTE — Assessment & Plan Note (Signed)
Hx pulm fibrosis Pt unable to take steroids and inhalers make her sick Abx sent in and cough med She needs to f/u pulmonay

## 2022-06-16 NOTE — Patient Instructions (Signed)

## 2022-06-19 ENCOUNTER — Ambulatory Visit (HOSPITAL_COMMUNITY)
Admission: RE | Admit: 2022-06-19 | Discharge: 2022-06-19 | Disposition: A | Discharge status: Home / Self Care | Payer: Medicare Other | Source: Ambulatory Visit | Attending: Family Medicine | Admitting: Family Medicine

## 2022-06-19 DIAGNOSIS — L24B3 Irritant contact dermatitis related to fecal or urinary stoma or fistula: Secondary | ICD-10-CM

## 2022-06-19 DIAGNOSIS — Z933 Colostomy status: Secondary | ICD-10-CM | POA: Insufficient documentation

## 2022-06-19 DIAGNOSIS — K9400 Colostomy complication, unspecified: Secondary | ICD-10-CM | POA: Diagnosis not present

## 2022-06-19 NOTE — Discharge Instructions (Signed)
See back in 2 weeks  Continue miralax

## 2022-06-19 NOTE — Progress Notes (Signed)
[facility]   Reason for visit:  LLQ colostomy HPI:  colsotomy Past Medical History:  Diagnosis Date   Arthritis    Asthmatic bronchitis with acute exacerbation 02/06/2015   Bronchial pneumonia    Colon polyp    Diverticulitis    DVT (deep venous thrombosis) ([facility])    lower extremity   H/O cardiac catheterization 2004   Normal coronary arteries   Heart murmur    History of stress test 05/21/2011   Hx of echocardiogram 01/27/2010   Normal Ef 55% the transmitral spectral doppler flow pattern is normal for age. the left ventricular wall motion is normal   IPF (idiopathic pulmonary fibrosis) ([facility]) 01/2018   Migraines    Pancreatitis    Thyroid disease    TIA (transient ischemic attack)    8 years ago   Family History  Problem Relation Age of Onset   Ovarian cancer Mother    Uterine cancer Mother    Lung cancer Father    HIV Brother        88   Kidney cancer Brother    Lung cancer Brother    Other Brother        Mouth Cancer   Colon cancer Maternal Aunt    Heart disease Maternal Grandmother    Stroke Maternal Grandmother    Hypertension Maternal Grandmother    Diabetes Maternal Grandmother    Arthritis Other    Esophageal cancer Neg Hx    Liver disease Neg Hx    Rectal cancer Neg Hx    Stomach cancer Neg Hx    Allergies  Allergen Reactions   Prednisone Other (See Comments)    Changed personality    Dilaudid [Hydromorphone] Itching and Other (See Comments)    Dilaudid caused marked confusion   Atrovent Hfa [Ipratropium Bromide Hfa] Itching and Other (See Comments)    Pt could not sleep   Cheratussin Ac [Guaifenesin-Codeine] Other (See Comments)    Headaches    Diltiazem Itching and Other (See Comments)    Makes patient sick. Shakes. GI   Hydrocodone Itching   Influenza Vaccines Other (See Comments)    Pt reports heart attack after last flu shot   Morphine Itching   Motrin [Ibuprofen] Itching and Other (See Comments)    "Gives false reading in  blood"   Oxycodone Itching   Codeine Itching, Nausea Only and Other (See Comments)    Pt takes promethazine with codeine at home   Current Outpatient Medications  Medication Sig Dispense Refill Last Dose   amoxicillin-clavulanate (AUGMENTIN) 875-125 MG tablet Take 1 tablet by mouth 2 (two) times daily. 20 tablet 0    aspirin EC 81 MG tablet Take 81 mg by mouth daily. Swallow whole.      promethazine-dextromethorphan (PROMETHAZINE-DM) 6.25-15 MG/5ML syrup Take 5 mLs by mouth 4 (four) times daily as needed. 118 mL 0    SYNTHROID 112 MCG tablet TAKE 1 TABLET BY MOUTH DAILY BEFORE BREAKFAST. 90 tablet 0    vitamin C (ASCORBIC ACID) 500 MG tablet Take 500 mg by mouth daily.      No current facility-administered medications for this encounter.   ROS  Review of Systems  Gastrointestinal:  Positive for abdominal pain.       Colostomy Pain in LLQ (lower than stoma)  MD aware and suggested oral analgesic.  Stool thick and motility slow.  Increase miralax   Skin:  Positive for rash.       Contact dermatitis to peristomal  skin  All other systems reviewed and are negative.  Vital signs:  BP (!) 181/71 (BP Location: Right Arm)   Pulse 74   Temp 97.6 F (36.4 C) (Oral)   Resp 20   SpO2 95%  Exam:  Physical Exam Vitals reviewed.  Constitutional:      Appearance: Normal appearance.  Abdominal:     Palpations: Abdomen is soft.  Skin:    General: Skin is warm and dry.     Findings: Rash present.  Neurological:     Mental Status: She is alert and oriented to person, place, and time.  Psychiatric:        Mood and Affect: Mood normal.        Behavior: Behavior normal.     Stoma type/location:  LLQ colostomy Stomal assessment/size:  1 1/4" oval stoma Peristomal assessment:  erythema, intact Treatment options for stomal/peristomal skin: barrier ring and 2 piece pouch Output: thick brown stool.  Still has some thick stool that sits on barrier and will not drain into pouch.  Encouraged to  increase miralax to twice daily.  She is drinking sufficient water, she reports Ostomy pouching:2pc. With  barrier ring Education provided:  continue pouching.  Reversal slated for next month    Impression/dx  Contact dermatitis colostomy Discussion  Continue pouching Plan  See back in 2 weeks.     Visit time: 40 minutes.   Domenic Moras FNP-BC

## 2022-06-29 ENCOUNTER — Ambulatory Visit (HOSPITAL_COMMUNITY)
Admission: RE | Admit: 2022-06-29 | Discharge: 2022-06-29 | Disposition: A | Discharge status: Home / Self Care | Payer: Medicare Other | Source: Ambulatory Visit | Attending: Family Medicine | Admitting: Family Medicine

## 2022-06-29 DIAGNOSIS — K9400 Colostomy complication, unspecified: Secondary | ICD-10-CM

## 2022-06-29 DIAGNOSIS — Z79899 Other long term (current) drug therapy: Secondary | ICD-10-CM | POA: Diagnosis not present

## 2022-06-29 DIAGNOSIS — Z933 Colostomy status: Secondary | ICD-10-CM | POA: Insufficient documentation

## 2022-06-29 NOTE — Progress Notes (Signed)
[facility]   Reason for visit:  LLQ colostomy HPI:   Past Medical History:  Diagnosis Date   Arthritis    Asthmatic bronchitis with acute exacerbation 02/06/2015   Bronchial pneumonia    Colon polyp    Diverticulitis    DVT (deep venous thrombosis) ([facility])    lower extremity   H/O cardiac catheterization 2004   Normal coronary arteries   Heart murmur    History of stress test 05/21/2011   Hx of echocardiogram 01/27/2010   Normal Ef 55% the transmitral spectral doppler flow pattern is normal for age. the left ventricular wall motion is normal   IPF (idiopathic pulmonary fibrosis) ([place]) 01/2018   Migraines    Pancreatitis    Thyroid disease    TIA (transient ischemic attack)    8 years ago   Family History  Problem Relation Age of Onset   Ovarian cancer Mother    Uterine cancer Mother    Lung cancer Father    HIV Brother        79   Kidney cancer Brother    Lung cancer Brother    Other Brother        Mouth Cancer   Colon cancer Maternal Aunt    Heart disease Maternal Grandmother    Stroke Maternal Grandmother    Hypertension Maternal Grandmother    Diabetes Maternal Grandmother    Arthritis Other    Esophageal cancer Neg Hx    Liver disease Neg Hx    Rectal cancer Neg Hx    Stomach cancer Neg Hx    Allergies  Allergen Reactions   Prednisone Other (See Comments)    Changed personality    Dilaudid [Hydromorphone] Itching and Other (See Comments)    Dilaudid caused marked confusion   Atrovent Hfa [Ipratropium Bromide Hfa] Itching and Other (See Comments)    Pt could not sleep   Cheratussin Ac [Guaifenesin-Codeine] Other (See Comments)    Headaches    Diltiazem Itching and Other (See Comments)    Makes patient sick. Shakes. GI   Hydrocodone Itching   Influenza Vaccines Other (See Comments)    Pt reports heart attack after last flu shot   Morphine Itching   Motrin [Ibuprofen] Itching and Other (See Comments)    "Gives false reading in blood"    Oxycodone Itching   Codeine Itching, Nausea Only and Other (See Comments)    Pt takes promethazine with codeine at home   Current Outpatient Medications  Medication Sig Dispense Refill Last Dose   amoxicillin-clavulanate (AUGMENTIN) 875-125 MG tablet Take 1 tablet by mouth 2 (two) times daily. 20 tablet 0    aspirin EC 81 MG tablet Take 81 mg by mouth daily. Swallow whole.      promethazine-dextromethorphan (PROMETHAZINE-DM) 6.25-15 MG/5ML syrup Take 5 mLs by mouth 4 (four) times daily as needed. 118 mL 0    SYNTHROID 112 MCG tablet TAKE 1 TABLET BY MOUTH DAILY BEFORE BREAKFAST. 90 tablet 0    vitamin C (ASCORBIC ACID) 500 MG tablet Take 500 mg by mouth daily.      No current facility-administered medications for this encounter.   ROS  Review of Systems  Gastrointestinal:        LLQ colostomy  Skin: Negative.   All other systems reviewed and are negative.  Vital signs:  BP (!) 156/84 (BP Location: Right Arm)   Pulse 85   Temp 97.6 F (36.4 C) (Oral)   Resp 18   SpO2  94%  Exam:  Physical Exam Vitals reviewed.  Constitutional:      Appearance: Normal appearance.  Abdominal:     Palpations: Abdomen is soft.  Skin:    General: Skin is warm and dry.  Neurological:     Mental Status: She is alert and oriented to person, place, and time.  Psychiatric:        Mood and Affect: Mood normal.        Behavior: Behavior normal.        Judgment: Judgment normal.     Stoma type/location:  LLQ colostomy Stomal assessment/size:  1 1/4" slightly oval stoma Peristomal assessment:  intact Treatment options for stomal/peristomal skin: barrier ring and 2 piece pouch   Output: soft brown stool Ostomy pouching: 2pc. 2 1/4" pouch with barrier ring Education provided:  continue same procedure.  Reversal less than a month away    Impression/dx  Colostomy Discussion  Reversal in one month Plan  See back as needed.     Visit time: 35 minutes.   Domenic Moras FNP-BC

## 2022-07-03 DIAGNOSIS — J189 Pneumonia, unspecified organism: Secondary | ICD-10-CM | POA: Diagnosis not present

## 2022-07-03 DIAGNOSIS — Z20822 Contact with and (suspected) exposure to covid-19: Secondary | ICD-10-CM | POA: Diagnosis not present

## 2022-07-03 DIAGNOSIS — R059 Cough, unspecified: Secondary | ICD-10-CM | POA: Diagnosis not present

## 2022-07-03 DIAGNOSIS — J90 Pleural effusion, not elsewhere classified: Secondary | ICD-10-CM | POA: Diagnosis not present

## 2022-07-03 DIAGNOSIS — J069 Acute upper respiratory infection, unspecified: Secondary | ICD-10-CM | POA: Diagnosis not present

## 2022-07-07 ENCOUNTER — Ambulatory Visit (HOSPITAL_COMMUNITY)
Admission: RE | Admit: 2022-07-07 | Discharge: 2022-07-07 | Disposition: A | Discharge status: Home / Self Care | Payer: Medicare Other | Source: Ambulatory Visit | Attending: Family Medicine | Admitting: Family Medicine

## 2022-07-07 ENCOUNTER — Ambulatory Visit (INDEPENDENT_AMBULATORY_CARE_PROVIDER_SITE_OTHER): Payer: Medicare Other | Admitting: Family Medicine

## 2022-07-07 ENCOUNTER — Encounter: Payer: Self-pay | Admitting: Family Medicine

## 2022-07-07 ENCOUNTER — Other Ambulatory Visit (HOSPITAL_COMMUNITY): Payer: Self-pay | Admitting: Nurse Practitioner

## 2022-07-07 VITALS — BP 138/88 | HR 80 | Temp 98.2°F | Resp 16 | Ht 64.0 in | Wt 139.4 lb

## 2022-07-07 DIAGNOSIS — J0140 Acute pansinusitis, unspecified: Secondary | ICD-10-CM

## 2022-07-07 DIAGNOSIS — Z933 Colostomy status: Secondary | ICD-10-CM | POA: Diagnosis present

## 2022-07-07 DIAGNOSIS — Z433 Encounter for attention to colostomy: Secondary | ICD-10-CM | POA: Diagnosis not present

## 2022-07-07 DIAGNOSIS — K9400 Colostomy complication, unspecified: Secondary | ICD-10-CM

## 2022-07-07 MED ORDER — AMOXICILLIN-POT CLAVULANATE 875-125 MG PO TABS: 1.0000 | ORAL_TABLET | Freq: Two times a day (BID) | ORAL | 0 refills | Status: DC

## 2022-07-07 NOTE — Progress Notes (Signed)
Subjective:   By signing my name below, I, [name], attest that this documentation has been prepared under the direction and in the presence of [name], DO. 07/07/2022   Patient ID: Alexandria [name], female    DOB: January 28, 1946, 77 y.o.   MRN: [id number]  Chief Complaint  Patient presents with  . follow up- fluid in lungs    HPI Patient is in today for office visit.   She had a recent sinus infection. The medicine she was on for her sinus infection makes her physically sick. She is currently using Flonase and ayr gel. Her hearing is bad in her left ear and she hasn't been sleeping a lot.  She is due to get surgery on the 18th.    Past Medical History:  Diagnosis Date  . Arthritis   . Asthmatic bronchitis with acute exacerbation 02/06/2015  . Bronchial pneumonia   . Colon polyp   . Diverticulitis   . DVT (deep venous thrombosis)    lower extremity  . H/O cardiac catheterization 2004   Normal coronary arteries  . Heart murmur   . History of stress test 05/21/2011  . Hx of echocardiogram 01/27/2010   Normal Ef 55% the transmitral spectral doppler flow pattern is normal for age. the left ventricular wall motion is normal  . IPF (idiopathic pulmonary fibrosis) 01/2018  . Migraines   . Pancreatitis   . Thyroid disease   . TIA (transient ischemic attack)    8 years ago    Past Surgical History:  Procedure Laterality Date  . BREAST BIOPSY     [facility]  . CARDIAC CATHETERIZATION     10/2013  . CATARACT EXTRACTION Right 03/22/2018  . LEFT HEART CATHETERIZATION WITH CORONARY ANGIOGRAM N/A 10/25/2013   Procedure: LEFT HEART CATHETERIZATION WITH CORONARY ANGIOGRAM;  Surgeon: Troy Sine, MD;  Location: [facility] CATH LAB;  Service: Cardiovascular;  Laterality: N/A;  . NECK SURGERY     x's 2  . TOTAL KNEE ARTHROPLASTY     Bilateral x's 2  . VAGINAL HYSTERECTOMY  10/17/1998   [place] BRONCHOSCOPY Bilateral 01/24/2018   Procedure: VIDEO BRONCHOSCOPY  WITH FLUORO;  Surgeon: [name], MD;  Location: [facility];  Service: Cardiopulmonary;  Laterality: Bilateral;  . XI ROBOTIC ASSISTED LOWER ANTERIOR RESECTION N/A 03/31/2022   Procedure: XI ROBOTIC ASSISTED LOWER ANTERIOR RESECTION;  Surgeon: Michael Boston, MD;  Location: [facility];  Service: General;  Laterality: N/A;    Family History  Problem Relation Age of Onset  . Ovarian cancer Mother   . Uterine cancer Mother   . Lung cancer Father   . HIV Brother        76  . Kidney cancer Brother   . Lung cancer Brother   . Other Brother        Mouth Cancer  . Colon cancer Maternal Aunt   . Heart disease Maternal Grandmother   . Stroke Maternal Grandmother   . Hypertension Maternal Grandmother   . Diabetes Maternal Grandmother   . Arthritis Other   . Esophageal cancer Neg Hx   . Liver disease Neg Hx   . Rectal cancer Neg Hx   . Stomach cancer Neg Hx     Social History   Socioeconomic History  . Marital status: Married    Spouse name: [name]  . Number of children: 3  . Years of education: [employer]  . Highest education level: Not on file  Occupational  History  . Occupation: DISABLED    Employer: UNEMPLOYED  . Occupation: retired  Tobacco Use  . Smoking status: Never  . Smokeless tobacco: Never  Vaping Use  . Vaping Use: Never used  Substance and Sexual Activity  . Alcohol use: No    Alcohol/week: 0.0 standard drinks of alcohol  . Drug use: No  . Sexual activity: Not on file  Other Topics Concern  . Not on file  Social History Narrative   Lives with husband [name]   Caffeine use: coffee (2 cups per day)   Mostly right-handed   Social Determinants of Health   Financial Resource Strain: Not on file  Food Insecurity: No Food Insecurity (03/31/2022)   Hunger Vital Sign   . Worried About Charity fundraiser in the Last Year: Never true   . Ran Out of Food in the Last Year: Never true  Transportation Needs: No Transportation Needs (03/31/2022)   PRAPARE -  Transportation   . Lack of Transportation (Medical): No   . Lack of Transportation (Non-Medical): No  Physical Activity: Not on file  Stress: Not on file  Social Connections: Not on file  Intimate Partner Violence: Not At Risk (03/31/2022)   Humiliation, Afraid, Rape, and Kick questionnaire   . Fear of Current or Ex-Partner: No   . Emotionally Abused: No   . Physically Abused: No   . Sexually Abused: No    Outpatient Medications Prior to Visit  Medication Sig Dispense Refill  . aspirin EC 81 MG tablet Take 81 mg by mouth daily. Swallow whole.    Marland Kitchen SYNTHROID 112 MCG tablet TAKE 1 TABLET BY MOUTH DAILY BEFORE BREAKFAST. 90 tablet 0  . vitamin C (ASCORBIC ACID) 500 MG tablet Take 500 mg by mouth daily.    Marland Kitchen amoxicillin-clavulanate (AUGMENTIN) 875-125 MG tablet Take 1 tablet by mouth 2 (two) times daily. 20 tablet 0  . promethazine-dextromethorphan (PROMETHAZINE-DM) 6.25-15 MG/5ML syrup Take 5 mLs by mouth 4 (four) times daily as needed. 118 mL 0   No facility-administered medications prior to visit.    Allergies  Allergen Reactions  . Prednisone Other (See Comments)    Changed personality   . Dilaudid [Hydromorphone] Itching and Other (See Comments)    Dilaudid caused marked confusion  . Atrovent Hfa [Ipratropium Bromide Hfa] Itching and Other (See Comments)    Pt could not sleep  . Cheratussin Ac [Guaifenesin-Codeine] Other (See Comments)    Headaches   . Diltiazem Itching and Other (See Comments)    Makes patient sick. Shakes. GI  . Hydrocodone Itching  . Influenza Vaccines Other (See Comments)    Pt reports heart attack after last flu shot  . Morphine Itching  . Motrin [Ibuprofen] Itching and Other (See Comments)    "Gives false reading in blood"  . Oxycodone Itching  . Codeine Itching, Nausea Only and Other (See Comments)    Pt takes promethazine with codeine at home    Review of Systems  Constitutional:  Negative for fever and malaise/fatigue.  HENT:  Positive for  sinus pain (Sinus infection). Negative for congestion.   Eyes:  Negative for blurred vision.  Respiratory:  Negative for shortness of breath.   Cardiovascular:  Negative for chest pain and palpitations. Leg swelling: (+) Can't hear out of left ear. Gastrointestinal:  Negative for abdominal pain, blood in stool and nausea.  Genitourinary:  Negative for dysuria and frequency.  Musculoskeletal:  Negative for falls.  Skin:  Negative for rash.  Neurological:  Negative for dizziness, loss of consciousness and headaches.  Endo/Heme/Allergies:  Negative for environmental allergies.  Psychiatric/Behavioral:  Negative for depression. The patient has insomnia. The patient is not nervous/anxious.        Objective:    Physical Exam Vitals and nursing note reviewed.  Constitutional:      General: She is not in acute distress.    Appearance: Normal appearance. She is not ill-appearing.  HENT:     Head: Normocephalic and atraumatic.     Right Ear: External ear normal.     Left Ear: External ear normal.  Eyes:     Extraocular Movements: Extraocular movements intact.     Pupils: Pupils are equal, round, and reactive to light.  Cardiovascular:     Rate and Rhythm: Normal rate and regular rhythm.     Heart sounds: Normal heart sounds. No murmur heard.    No gallop.  Pulmonary:     Effort: Pulmonary effort is normal. No respiratory distress.     Breath sounds: Normal breath sounds. No wheezing or rales.  Skin:    General: Skin is warm and dry.  Neurological:     General: No focal deficit present.     Mental Status: She is alert and oriented to person, place, and time.  Psychiatric:        Mood and Affect: Mood normal.        Judgment: Judgment normal.    BP 138/88 (BP Location: Left Arm, Cuff Size: Normal)   Pulse 80   Temp 98.2 F (36.8 C) (Oral)   Resp 16   Ht 5\' 4"  (1.626 m)   Wt 139 lb 6.4 oz (63.2 kg)   SpO2 95%   BMI 23.93 kg/m  Wt Readings from Last 3 Encounters:  07/07/22  139 lb 6.4 oz (63.2 kg)  07/07/22 137 lb (62.1 kg)  06/16/22 140 lb 9.6 oz (63.8 kg)       Assessment & Plan:  Acute non-recurrent pansinusitis -     Amoxicillin-Pot Clavulanate; Take 1 tablet by mouth 2 (two) times daily.  Dispense: 20 tablet; Refill: 0    I, [name], personally preformed the services described in this documentation.  All medical record entries made by the scribe were at my direction and in my presence.  I have reviewed the chart and discharge instructions (if applicable) and agree that the record reflects my personal performance and is accurate and complete. 07/07/2022  [name]   [employer] as a Education administrator for [name], DO.,have documented all relevant documentation on the behalf of [name], DO,as directed by  [name], DO while in the presence of [name], DO.

## 2022-07-07 NOTE — Progress Notes (Signed)
[facility]   Reason for visit:  LLQ colostomy HPI:  Diverticulitis with perforation, end colostomy Past Medical History:  Diagnosis Date   Arthritis    Asthmatic bronchitis with acute exacerbation 02/06/2015   Bronchial pneumonia    Colon polyp    Diverticulitis    DVT (deep venous thrombosis) (HCC)    lower extremity   H/O cardiac catheterization 2004   Normal coronary arteries   Heart murmur    History of stress test 05/21/2011   Hx of echocardiogram 01/27/2010   Normal Ef 55% the transmitral spectral doppler flow pattern is normal for age. the left ventricular wall motion is normal   IPF (idiopathic pulmonary fibrosis) ([place]) 01/2018   Migraines    Pancreatitis    Thyroid disease    TIA (transient ischemic attack)    8 years ago   Family History  Problem Relation Age of Onset   Ovarian cancer Mother    Uterine cancer Mother    Lung cancer Father    HIV Brother        45   Kidney cancer Brother    Lung cancer Brother    Other Brother        Mouth Cancer   Colon cancer Maternal Aunt    Heart disease Maternal Grandmother    Stroke Maternal Grandmother    Hypertension Maternal Grandmother    Diabetes Maternal Grandmother    Arthritis Other    Esophageal cancer Neg Hx    Liver disease Neg Hx    Rectal cancer Neg Hx    Stomach cancer Neg Hx    Allergies  Allergen Reactions   Prednisone Other (See Comments)    Changed personality    Dilaudid [Hydromorphone] Itching and Other (See Comments)    Dilaudid caused marked confusion   Atrovent Hfa [Ipratropium Bromide Hfa] Itching and Other (See Comments)    Pt could not sleep   Cheratussin Ac [Guaifenesin-Codeine] Other (See Comments)    Headaches    Diltiazem Itching and Other (See Comments)    Makes patient sick. Shakes. GI   Hydrocodone Itching   Influenza Vaccines Other (See Comments)    Pt reports heart attack after last flu shot   Morphine Itching   Motrin [Ibuprofen] Itching and Other (See  Comments)    "Gives false reading in blood"   Oxycodone Itching   Codeine Itching, Nausea Only and Other (See Comments)    Pt takes promethazine with codeine at home   Current Outpatient Medications  Medication Sig Dispense Refill Last Dose   amoxicillin-clavulanate (AUGMENTIN) 875-125 MG tablet Take 1 tablet by mouth 2 (two) times daily. 20 tablet 0    aspirin EC 81 MG tablet Take 81 mg by mouth daily. Swallow whole.      promethazine-dextromethorphan (PROMETHAZINE-DM) 6.25-15 MG/5ML syrup Take 5 mLs by mouth 4 (four) times daily as needed. 118 mL 0    SYNTHROID 112 MCG tablet TAKE 1 TABLET BY MOUTH DAILY BEFORE BREAKFAST. 90 tablet 0    vitamin C (ASCORBIC ACID) 500 MG tablet Take 500 mg by mouth daily.      No current facility-administered medications for this encounter.   ROS  Review of Systems  Gastrointestinal:        LLQ colostomy, flush  Psychiatric/Behavioral: Negative.    All other systems reviewed and are negative.  Vital signs:  BP (!) 142/89   Pulse (!) 119   Temp (!) 97.4 F (36.3 C) (Oral)   Resp  18   Ht 5\' 4"  (1.626 m)   Wt 62.1 kg   SpO2 94%   BMI 23.52 kg/m  Exam:  Physical Exam Vitals reviewed.  Constitutional:      Appearance: Normal appearance.  Abdominal:     Palpations: Abdomen is soft.  Skin:    General: Skin is warm and dry.  Neurological:     Mental Status: She is alert and oriented to person, place, and time.  Psychiatric:        Mood and Affect: Mood normal.        Behavior: Behavior normal.     Stoma type/location:  LLQ colostomy Stomal assessment/size:  1 1/4" round, flush Peristomal assessment:  intact skin Treatment options for stomal/peristomal skin: barrier ring and 2 piece pouch Output: soft brown stool.  Takes miralax  Ostomy pouching: 2pc. 2 1/4" with barrier ring Education provided:  Denzil Hughes has never confirmed supplies.  Will set up again with [facility].     Impression/dx  Colostomy complication Discussion  See back in  one week Plan  Set up for supplies    Visit time: 45 minutes.   Domenic Moras FNP-BC

## 2022-07-07 NOTE — Patient Instructions (Signed)

## 2022-07-09 ENCOUNTER — Ambulatory Visit (HOSPITAL_COMMUNITY): Payer: Medicare Other | Attending: Cardiovascular Disease

## 2022-07-09 DIAGNOSIS — R011 Cardiac murmur, unspecified: Secondary | ICD-10-CM

## 2022-07-09 DIAGNOSIS — R918 Other nonspecific abnormal finding of lung field: Secondary | ICD-10-CM | POA: Diagnosis not present

## 2022-07-09 LAB — ECHOCARDIOGRAM COMPLETE
Area-P 1/2: 3.07 cm2
P 1/2 time: 702 msec
S' Lateral: 3.3 cm

## 2022-07-10 ENCOUNTER — Ambulatory Visit (HOSPITAL_COMMUNITY)
Admission: RE | Admit: 2022-07-10 | Discharge: 2022-07-10 | Disposition: A | Discharge status: Home / Self Care | Payer: Medicare Other | Source: Ambulatory Visit | Attending: Nurse Practitioner | Admitting: Nurse Practitioner

## 2022-07-10 ENCOUNTER — Ambulatory Visit: Payer: BC Managed Care – PPO | Admitting: Sports Medicine

## 2022-07-10 DIAGNOSIS — Z933 Colostomy status: Secondary | ICD-10-CM | POA: Insufficient documentation

## 2022-07-10 DIAGNOSIS — L539 Erythematous condition, unspecified: Secondary | ICD-10-CM | POA: Diagnosis not present

## 2022-07-10 DIAGNOSIS — K5900 Constipation, unspecified: Secondary | ICD-10-CM | POA: Diagnosis not present

## 2022-07-10 DIAGNOSIS — K9400 Colostomy complication, unspecified: Secondary | ICD-10-CM | POA: Diagnosis not present

## 2022-07-10 DIAGNOSIS — K5780 Diverticulitis of intestine, part unspecified, with perforation and abscess without bleeding: Secondary | ICD-10-CM | POA: Diagnosis not present

## 2022-07-10 NOTE — Progress Notes (Signed)
[facility]   Reason for visit:  LLQ colostomy HPI:  Diverticulitis with perforation, end colostomy Past Medical History:  Diagnosis Date   Arthritis    Asthmatic bronchitis with acute exacerbation 02/06/2015   Bronchial pneumonia    Colon polyp    Diverticulitis    DVT (deep venous thrombosis)    lower extremity   H/O cardiac catheterization 2004   Normal coronary arteries   Heart murmur    History of stress test 05/21/2011   Hx of echocardiogram 01/27/2010   Normal Ef 55% the transmitral spectral doppler flow pattern is normal for age. the left ventricular wall motion is normal   IPF (idiopathic pulmonary fibrosis) 01/2018   Migraines    Pancreatitis    Thyroid disease    TIA (transient ischemic attack)    8 years ago   Family History  Problem Relation Age of Onset   Ovarian cancer Mother    Uterine cancer Mother    Lung cancer Father    HIV Brother        [id number]   Kidney cancer Brother    Lung cancer Brother    Other Brother        Mouth Cancer   Colon cancer Maternal Aunt    Heart disease Maternal Grandmother    Stroke Maternal Grandmother    Hypertension Maternal Grandmother    Diabetes Maternal Grandmother    Arthritis Other    Esophageal cancer Neg Hx    Liver disease Neg Hx    Rectal cancer Neg Hx    Stomach cancer Neg Hx    Allergies  Allergen Reactions   Prednisone Other (See Comments)    Changed personality    Dilaudid [Hydromorphone] Itching and Other (See Comments)    Dilaudid caused marked confusion   Atrovent Hfa [Ipratropium Bromide Hfa] Itching and Other (See Comments)    Pt could not sleep   Cheratussin Ac [Guaifenesin-Codeine] Other (See Comments)    Headaches    Diltiazem Itching and Other (See Comments)    Makes patient sick. Shakes. GI   Hydrocodone Itching   Influenza Vaccines Other (See Comments)    Pt reports heart attack after last flu shot   Morphine Itching   Motrin [Ibuprofen] Itching and Other (See Comments)     "Gives false reading in blood"   Oxycodone Itching   Codeine Itching, Nausea Only and Other (See Comments)    Pt takes promethazine with codeine at home   Current Outpatient Medications  Medication Sig Dispense Refill Last Dose   amoxicillin-clavulanate (AUGMENTIN) 875-125 MG tablet Take 1 tablet by mouth 2 (two) times daily. 20 tablet 0    aspirin EC 81 MG tablet Take 81 mg by mouth daily. Swallow whole.      OFEV 100 MG CAPS Take 100 mg by mouth daily.      sodium chloride (OCEAN) 0.65 % SOLN nasal spray Place 1 spray into both nostrils as needed for congestion.      SYNTHROID 112 MCG tablet TAKE 1 TABLET BY MOUTH DAILY BEFORE BREAKFAST. 90 tablet 0    vitamin C (ASCORBIC ACID) 500 MG tablet Take 500 mg by mouth daily.      No current facility-administered medications for this encounter.   ROS  Review of Systems  Gastrointestinal:  Positive for constipation.       LLQ colostomy  Skin:  Positive for color change.  All other systems reviewed and are negative.  Vital signs:  BP Marland Kitchen(!)  152/63 (BP Location: Right Arm)   Pulse 86   Temp 97.9 F (36.6 C) (Oral)   Resp 20   SpO2 93%  Exam:  Physical Exam Vitals reviewed.  Constitutional:      Appearance: Normal appearance.  Abdominal:     Palpations: Abdomen is soft.  Skin:    General: Skin is warm and dry.     Findings: Erythema present.     Comments: Peristomal skin red  Neurological:     Mental Status: She is alert and oriented to person, place, and time.  Psychiatric:        Mood and Affect: Mood normal.        Behavior: Behavior normal.     Stoma type/location:  LLQ colostomy Stomal assessment/size:  1 1/4" round flush Peristomal assessment:  intact Treatment options for stomal/peristomal skin: barrier ring and 2 piece pouch Output: soft brown stool Ostomy pouching: /2pc. 2 1/4    Education provided:  anticipating reversal this month.     Impression/dx  colostomy Discussion  See back one week Plan  Back in  one week    Visit time: 40 minutes.   Maple Hudson FNP-BC

## 2022-07-10 NOTE — Patient Instructions (Signed)
SURGICAL WAITING ROOM VISITATION  Patients having surgery or a procedure may have no more than 2 support people in the waiting area - these visitors may rotate.    Children under the age of 72 must have an adult with them who is not the patient.  Due to an increase in RSV and influenza rates and associated hospitalizations, children ages 7 and under may not visit patients in [facility] hospitals.  If the patient needs to stay at the hospital during part of their recovery, the visitor guidelines for inpatient rooms apply. Pre-op nurse will coordinate an appropriate time for 1 support person to accompany patient in pre-op.  This support person may not rotate.    Please refer to the [facility] website for the visitor guidelines for Inpatients (after your surgery is over and you are in a regular room).       Your procedure is scheduled on:  07/23/2022    Report to [facility] Main Entrance    Report to admitting at   [phone]   Call this number if you have problems the morning of surgery [phone]   Do not eat food :After Midnight.   After Midnight you may have the following liquids until __ 0430____ AM DAY OF SURGERY  Water Non-Citrus Juices (without pulp, NO RED-Apple, White grape, White cranberry) Black Coffee (NO MILK/CREAM OR CREAMERS, sugar ok)  Clear Tea (NO MILK/CREAM OR CREAMERS, sugar ok) regular and decaf                             Plain Jell-O (NO RED)                                           Fruit ices (not with fruit pulp, NO RED)                                     Popsicles (NO RED)                                                               Sports drinks like Gatorade (NO RED)             Clear  liquid diet on day of bowel prep.             Follow bowel prep instrucitons  Drink 2 Ensure/G2 drinks AT 10:00 PM the night before surgery.        The day of surgery:  Drink ONE (1) Pre-Surgery Clear Ensure or G2 at  0430  AM  ( have completed by ) the  morning of surgery. Drink in one sitting. Do not sip.  This drink was given to you during your hospital  pre-op appointment visit. Nothing else to drink after completing the  Pre-Surgery Clear Ensure or G2.          If you have questions, please contact your surgeon's office.   FOLLOW BOWEL PREP AND ANY ADDITIONAL PRE OP INSTRUCTIONS YOU RECEIVED FROM YOUR SURGEON'S OFFICE!!!     Oral Hygiene is  also important to reduce your risk of infection.                                    Remember - BRUSH YOUR TEETH THE MORNING OF SURGERY WITH YOUR REGULAR TOOTHPASTE  DENTURES WILL BE REMOVED PRIOR TO SURGERY PLEASE DO NOT APPLY "Poly grip" OR ADHESIVES!!!   Do NOT smoke after Midnight   Take these medicines the morning of surgery with A SIP OF WATER:  synthroid   DO NOT TAKE ANY ORAL DIABETIC MEDICATIONS DAY OF YOUR SURGERY  Bring CPAP mask and tubing day of surgery.                              You may not have any metal on your body including hair pins, jewelry, and body piercing             Do not wear make-up, lotions, powders, perfumes/cologne, or deodorant  Do not wear nail polish including gel and S&S, artificial/acrylic nails, or any other type of covering on natural nails including finger and toenails. If you have artificial nails, gel coating, etc. that needs to be removed by a nail salon please have this removed prior to surgery or surgery may need to be canceled/ delayed if the surgeon/ anesthesia feels like they are unable to be safely monitored.   Do not shave  48 hours prior to surgery.               Men may shave face and neck.   Do not bring valuables to the hospital. [facility] IS NOT             RESPONSIBLE   FOR VALUABLES.   Contacts, glasses, dentures or bridgework may not be worn into surgery.   Bring small overnight bag day of surgery.   DO NOT BRING YOUR HOME MEDICATIONS TO THE HOSPITAL. PHARMACY WILL DISPENSE MEDICATIONS LISTED ON YOUR MEDICATION LIST TO YOU  DURING YOUR ADMISSION IN THE HOSPITAL!    Patients discharged on the day of surgery will not be allowed to drive home.  Someone NEEDS to stay with you for the first 24 hours after anesthesia.   Special Instructions: Bring a copy of your healthcare power of attorney and living will documents the day of surgery if you haven't scanned them before.              Please read over the following fact sheets you were given: IF YOU HAVE QUESTIONS ABOUT YOUR PRE-OP INSTRUCTIONS PLEASE CALL [phone]   If you received a COVID test during your pre-op visit  it is requested that you wear a mask when out in public, stay away from anyone that may not be feeling well and notify your surgeon if you develop symptoms. If you test positive for Covid or have been in contact with anyone that has tested positive in the last 10 days please notify you surgeon.    [facility] - Preparing for Surgery Before surgery, you can play an important role.  Because skin is not sterile, your skin needs to be as free of germs as possible.  You can reduce the number of germs on your skin by washing with CHG (chlorahexidine gluconate) soap before surgery.  CHG is an antiseptic cleaner which kills germs and bonds with the skin to continue killing germs even after  washing. Please DO NOT use if you have an allergy to CHG or antibacterial soaps.  If your skin becomes reddened/irritated stop using the CHG and inform your nurse when you arrive at Short Stay. Do not shave (including legs and underarms) for at least 48 hours prior to the first CHG shower.  You may shave your face/neck. Please follow these instructions carefully:  1.  Shower with CHG Soap the night before surgery and the  morning of Surgery.  2.  If you choose to wash your hair, wash your hair first as usual with your  normal  shampoo.  3.  After you shampoo, rinse your hair and body thoroughly to remove the  shampoo.                           4.  Use CHG as you would any other  liquid soap.  You can apply chg directly  to the skin and wash                       Gently with a scrungie or clean washcloth.  5.  Apply the CHG Soap to your body ONLY FROM THE NECK DOWN.   Do not use on face/ open                           Wound or open sores. Avoid contact with eyes, ears mouth and genitals (private parts).                       Wash face,  Genitals (private parts) with your normal soap.             6.  Wash thoroughly, paying special attention to the area where your surgery  will be performed.  7.  Thoroughly rinse your body with warm water from the neck down.  8.  DO NOT shower/wash with your normal soap after using and rinsing off  the CHG Soap.                9.  Pat yourself dry with a clean towel.            10.  Wear clean pajamas.            11.  Place clean sheets on your bed the night of your first shower and do not  sleep with pets. Day of Surgery : Do not apply any lotions/deodorants the morning of surgery.  Please wear clean clothes to the hospital/surgery center.  FAILURE TO FOLLOW THESE INSTRUCTIONS MAY RESULT IN THE CANCELLATION OF YOUR SURGERY PATIENT SIGNATURE_________________________________  NURSE SIGNATURE__________________________________  ________________________________________________________________________

## 2022-07-10 NOTE — Progress Notes (Signed)
Anesthesia Review:  NOT:[facility] Lowne Chase- 07/07/22- LOV  Cardiologist : Croituri LOV 06/15/22  Pulmonology- Lysle Pearl- 06/29/22 LOV  Chest x-ray : EKG : 06/08/22  Echo : 07/09/22  CT Cors- 10/06/19  Monitor- 01/14/20  PFT- 05/08/20  Stress test: Cardiac Cath :  Activity level: can do a flgiht of stairs withoiut difficutly  Sleep Study/ CPAP : none  Fasting Blood Sugar :      / Checks Blood Sugar -- times a day:   Blood Thinner/ Instructions /Last Dose: ASA / Instructions/ Last Dose :    81 mg aspirin    Pt has copy of bowel prep instructions and clear liquid diet at preop appt.      AT preop appt blood pressure readings as follows: 199/83 in left arm in right arm 212/74 and then again in left arm was 199/76.  On dynamapp pulse was 125-130.  PT states she is very cold and very nervous . Pt states she is cold natured normally but evern more cold in preop dept.  Denies any chest, pain, dizziness, shortness of breath or headache.  EKG done.  Above shown to [facility]  No new orders given .  Pt has copy of blood pressure readings to take to PCP and to call them  PT voiced understanding.    PT did not want Ensure presurgery drinks.  Stated" They are too sweet".  PT givedn G2lower sugar instead.

## 2022-07-13 ENCOUNTER — Encounter (HOSPITAL_COMMUNITY): Payer: Self-pay

## 2022-07-13 ENCOUNTER — Other Ambulatory Visit: Payer: Self-pay

## 2022-07-13 ENCOUNTER — Encounter (HOSPITAL_COMMUNITY)
Admission: RE | Admit: 2022-07-13 | Discharge: 2022-07-13 | Disposition: A | Discharge status: Home / Self Care | Payer: Medicare Other | Source: Ambulatory Visit | Attending: Surgery | Admitting: Surgery

## 2022-07-13 VITALS — BP 199/76 | HR 124 | Temp 97.8°F | Resp 16 | Ht 64.0 in | Wt 142.0 lb

## 2022-07-13 DIAGNOSIS — E039 Hypothyroidism, unspecified: Secondary | ICD-10-CM | POA: Insufficient documentation

## 2022-07-13 DIAGNOSIS — Z86718 Personal history of other venous thrombosis and embolism: Secondary | ICD-10-CM | POA: Insufficient documentation

## 2022-07-13 DIAGNOSIS — I2510 Atherosclerotic heart disease of native coronary artery without angina pectoris: Secondary | ICD-10-CM | POA: Insufficient documentation

## 2022-07-13 DIAGNOSIS — Z01818 Encounter for other preprocedural examination: Secondary | ICD-10-CM | POA: Diagnosis not present

## 2022-07-13 DIAGNOSIS — Z433 Encounter for attention to colostomy: Secondary | ICD-10-CM | POA: Insufficient documentation

## 2022-07-13 DIAGNOSIS — I5030 Unspecified diastolic (congestive) heart failure: Secondary | ICD-10-CM | POA: Diagnosis not present

## 2022-07-13 HISTORY — DX: Hypothyroidism, unspecified: E03.9

## 2022-07-13 HISTORY — DX: Acute myocardial infarction, unspecified: I21.9

## 2022-07-13 LAB — BASIC METABOLIC PANEL
Anion gap: 9 (ref 5–15)
BUN: 14 mg/dL (ref 8–23)
CO2: 27 mmol/L (ref 22–32)
Calcium: 9 mg/dL (ref 8.9–10.3)
Chloride: 100 mmol/L (ref 98–111)
Creatinine, Ser: 0.68 mg/dL (ref 0.44–1.00)
GFR, Estimated: >60 mL/min (ref >60)
Glucose, Bld: 99 mg/dL (ref 70–99)
Potassium: 3.6 mmol/L (ref 3.5–5.1)
Sodium: 136 mmol/L (ref 135–145)

## 2022-07-13 LAB — CBC
HCT: 44.4 % (ref 36.0–46.0)
Hemoglobin: 14.1 g/dL (ref 12.0–15.0)
MCH: 31.8 pg (ref 26.0–34.0)
MCHC: 31.8 g/dL (ref 30.0–36.0)
MCV: 100 fL (ref 80.0–100.0)
Platelets: 218 10*3/uL (ref 150–400)
RBC: 4.44 MIL/uL (ref 3.87–5.11)
RDW: 14.1 % (ref 11.5–15.5)
WBC: 5.7 10*3/uL (ref 4.0–10.5)
nRBC: 0 % (ref 0.0–0.2)

## 2022-07-14 ENCOUNTER — Ambulatory Visit (HOSPITAL_COMMUNITY)
Admission: RE | Admit: 2022-07-14 | Discharge: 2022-07-14 | Disposition: A | Discharge status: Home / Self Care | Payer: Medicare Other | Source: Ambulatory Visit | Attending: Nurse Practitioner | Admitting: Nurse Practitioner

## 2022-07-14 DIAGNOSIS — K5792 Diverticulitis of intestine, part unspecified, without perforation or abscess without bleeding: Secondary | ICD-10-CM | POA: Diagnosis not present

## 2022-07-14 DIAGNOSIS — Z933 Colostomy status: Secondary | ICD-10-CM | POA: Insufficient documentation

## 2022-07-14 DIAGNOSIS — K9400 Colostomy complication, unspecified: Secondary | ICD-10-CM

## 2022-07-14 DIAGNOSIS — L24B3 Irritant contact dermatitis related to fecal or urinary stoma or fistula: Secondary | ICD-10-CM | POA: Diagnosis not present

## 2022-07-14 NOTE — Progress Notes (Signed)
Anesthesia Chart Review   Case: [id number] Date/Time: 07/23/22 0715   Procedures:      ROBOTIC OSTOMY TAKEDOWN     LYSIS OF ADHESION     RIGID PROCTOSCOPY     CYSTOSCOPY with FIREFLY INJECTION   Anesthesia type: General   Pre-op diagnosis: COLOSTOMY FOR COLON RESECTION. DESIRE FOR OSTOMY TAKEDOWN   Location: [facility] ROOM 02 / [facility]   Surgeons: Karie Soda, MD; Crist Fat, MD       DISCUSSION:77 y.o. never smoker with h/o RBBB, diastolic heart failure, CAD, interstitial lung disease, hypothyroidism, DVT, colostomy in place scheduled for above procedure 07/23/2022 with Dr. Karie Soda and Dr. Berniece Salines.   Pt seen by cardiology 06/15/22 for evaluation of bradycardia.  Coronary angiography in 2015 did not show any evidence of stenosis. More recently coronary CTA showed a calcium score of 0 although she did have some mild aortic atherosclerosis. In regards to bradycardia, "Suspect that this is artifactual and related to "under counting" of premature beats when checking the peripheral pulse with an automatic blood pressure cuff for direct palpation of the pulse or pulse oximeter. She has not had symptoms of bradycardia. There is no EKG documentation of bradycardia during her prolonged hospitalization or multiple subsequent ER visits and ECGs. A few years ago and ". Euvolemic at this visit. Echo ordered.   Echo 07/09/2022 with EF 55-60%, mild mitral regurgitation, aortic regurg mild to moderate, no aortic stenosis.   Pt seen in Urgent Care 07/03/2022 with 4 days of fever, chills, cough. Chest xray with small right pleural effusion with adjacent airspace opacity.  Treated for pneumonia with Levaquin.  See by PCP on 07/07/2022, started on Augmenting for sinus infection. Follow up with pulmonology 07/09/2022.  Advised to stay on Augmentin and recommend repeat xray later this month.    Discussed with triage nurse at [facility].  Case will need to be postponed 8 weeks from pneumonia diagnosis  per anesthesia guidelines.    VS: BP (!) 199/76   Pulse (!) 124   Temp 36.6 C (Oral)   Resp 16   Ht 5\' 4"  (1.626 m)   Wt 64.4 kg   SpO2 97%   BMI 24.37 kg/m   PROVIDERS: Donato Schultz, DO is PCP   Pulmonology with [facility], last seen by Viviana Simpler, PA-C LABS: Labs reviewed: Acceptable for surgery. (all labs ordered are listed, but only abnormal results are displayed)  Labs Reviewed  CBC  BASIC METABOLIC PANEL  TYPE AND SCREEN     IMAGES:   EKG:   CV: Echo 07/09/2022 1. Left ventricular ejection fraction, by estimation, is 55 to 60%. The  left ventricle has normal function. The left ventricle has no regional  wall motion abnormalities. There is mild concentric left ventricular  hypertrophy. Left ventricular diastolic  parameters are consistent with Grade I diastolic dysfunction (impaired  relaxation).   2. Right ventricular systolic function is normal. The right ventricular  size is normal. Tricuspid regurgitation signal is inadequate for assessing  PA pressure.   3. The mitral valve is normal in structure. Mild mitral valve  regurgitation. No evidence of mitral stenosis. Moderate mitral annular  calcification.   4. The aortic valve is tricuspid. There is mild calcification of the  aortic valve. Aortic valve regurgitation is mild to moderate. No aortic  stenosis is present.   5. Aortic dilatation noted. There is mild dilatation of the ascending  aorta, measuring 42 mm.   6. IVC not  visualized.   7. Frequent PACs noted   Coronary CT angiogram 10/06/2019  1. Coronary calcium score of 0. This was 0 percentile for age and sex matched control.   2. Normal coronary origin with right dominance.   3. No evidence of CAD.   4.  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   5.  Mild mitral annular calcification.   5.  Aortic Atherosclerosis. Past Medical History:  Diagnosis Date   Arthritis    Bronchial pneumonia    Colon polyp     Diverticulitis    DVT (deep venous thrombosis)    lower extremity   H/O cardiac catheterization 2004   Normal coronary arteries   Heart murmur    History of stress test 05/21/2011   Hx of echocardiogram 01/27/2010   Normal Ef 55% the transmitral spectral doppler flow pattern is normal for age. the left ventricular wall motion is normal   Hypothyroidism    IPF (idiopathic pulmonary fibrosis) 01/2018   Myocardial infarction    hx of 30 years ago   Pancreatitis    Thyroid disease    TIA (transient ischemic attack)    8 years ago    Past Surgical History:  Procedure Laterality Date   BREAST BIOPSY     [place]   CARDIAC CATHETERIZATION     10/2013   CATARACT EXTRACTION Right 03/22/2018   LEFT HEART CATHETERIZATION WITH CORONARY ANGIOGRAM N/A 10/25/2013   Procedure: LEFT HEART CATHETERIZATION WITH CORONARY ANGIOGRAM;  Surgeon: Lennette Bihari, MD;  Location: [facility] CATH LAB;  Service: Cardiovascular;  Laterality: N/A;   NECK SURGERY     x's 2   TOTAL KNEE ARTHROPLASTY     Bilateral x's 2   VAGINAL HYSTERECTOMY  10/17/1998   Ron Jennette Kettle   VIDEO BRONCHOSCOPY Bilateral 01/24/2018   Procedure: VIDEO BRONCHOSCOPY WITH FLUORO;  Surgeon: Lupita Leash, MD;  Location: [facility] ENDOSCOPY;  Service: Cardiopulmonary;  Laterality: Bilateral;   XI ROBOTIC ASSISTED LOWER ANTERIOR RESECTION N/A 03/31/2022   Procedure: XI ROBOTIC ASSISTED LOWER ANTERIOR RESECTION;  Surgeon: Karie Soda, MD;  Location: [facility];  Service: General;  Laterality: N/A;    MEDICATIONS:  amoxicillin-clavulanate (AUGMENTIN) 875-125 MG tablet   aspirin EC 81 MG tablet   OFEV 100 MG CAPS   sodium chloride (OCEAN) 0.65 % SOLN nasal spray   SYNTHROID 112 MCG tablet   vitamin C (ASCORBIC ACID) 500 MG tablet   No current facility-administered medications for this encounter.    Jodell Cipro Ward, PA-C [facility] [phone]

## 2022-07-14 NOTE — Progress Notes (Signed)
[facility]   Reason for visit:  LLQ colostomy HPI:  Diverticulitis with perforation, end colostomy Past Medical History:  Diagnosis Date   Arthritis    Bronchial pneumonia    Colon polyp    Diverticulitis    DVT (deep venous thrombosis)    lower extremity   H/O cardiac catheterization 2004   Normal coronary arteries   Heart murmur    History of stress test 05/21/2011   Hx of echocardiogram 01/27/2010   Normal Ef 55% the transmitral spectral doppler flow pattern is normal for age. the left ventricular wall motion is normal   Hypothyroidism    IPF (idiopathic pulmonary fibrosis) 01/2018   Myocardial infarction    hx of 30 years ago   Pancreatitis    Thyroid disease    TIA (transient ischemic attack)    8 years ago   Family History  Problem Relation Age of Onset   Ovarian cancer Mother    Uterine cancer Mother    Lung cancer Father    HIV Brother        [id number]   Kidney cancer Brother    Lung cancer Brother    Other Brother        Mouth Cancer   Colon cancer Maternal Aunt    Heart disease Maternal Grandmother    Stroke Maternal Grandmother    Hypertension Maternal Grandmother    Diabetes Maternal Grandmother    Arthritis Other    Esophageal cancer Neg Hx    Liver disease Neg Hx    Rectal cancer Neg Hx    Stomach cancer Neg Hx    Allergies  Allergen Reactions   Prednisone Other (See Comments)    Changed personality    Dilaudid [Hydromorphone] Itching and Other (See Comments)    Dilaudid caused marked confusion   Atrovent Hfa [Ipratropium Bromide Hfa] Itching and Other (See Comments)    Pt could not sleep   Cheratussin Ac [Guaifenesin-Codeine] Other (See Comments)    Headaches    Diltiazem Itching and Other (See Comments)    Makes patient sick. Shakes. GI   Hydrocodone Itching   Influenza Vaccines Other (See Comments)    Pt reports heart attack after last flu shot   Morphine Itching   Motrin [Ibuprofen] Itching and Other (See Comments)     "Gives false reading in blood"   Oxycodone Itching   Codeine Itching, Nausea Only and Other (See Comments)    Pt takes promethazine with codeine at home   Current Outpatient Medications  Medication Sig Dispense Refill Last Dose   amoxicillin-clavulanate (AUGMENTIN) 875-125 MG tablet Take 1 tablet by mouth 2 (two) times daily. 20 tablet 0    aspirin EC 81 MG tablet Take 81 mg by mouth daily. Swallow whole.      OFEV 100 MG CAPS Take 100 mg by mouth daily.      sodium chloride (OCEAN) 0.65 % SOLN nasal spray Place 1 spray into both nostrils as needed for congestion.      SYNTHROID 112 MCG tablet TAKE 1 TABLET BY MOUTH DAILY BEFORE BREAKFAST. 90 tablet 0    vitamin C (ASCORBIC ACID) 500 MG tablet Take 500 mg by mouth daily.      No current facility-administered medications for this encounter.   ROS  Review of Systems  Gastrointestinal:        LLQ colostomy  Skin:  Positive for color change.       Peristomal erythema  All other systems reviewed  and are negative.  Vital signs:  BP (!) 161/84 (BP Location: Right Arm)   Pulse 85   Temp 98.1 F (36.7 C) (Oral)   Resp 16   SpO2 94%  Exam:  Physical Exam Vitals reviewed.  Constitutional:      Appearance: Normal appearance.  Abdominal:     Palpations: Abdomen is soft.  Skin:    General: Skin is warm and dry.     Findings: Erythema present.  Neurological:     Mental Status: She is alert and oriented to person, place, and time.  Psychiatric:        Mood and Affect: Mood normal.        Behavior: Behavior normal.     Stoma type/location:  LLQ colostomy Stomal assessment/size:  1 1/4" round and flush Peristomal assessment:  intact Treatment options for stomal/peristomal skin: barrier ring and 2 piece pouch Output: soft brown stool Ostomy pouching:  barrier ring 2pc. pouch Education provided:  Reversal 07/23/22      Impression/dx  Diverticulitis with end colostomy Discussion  Continues to manage pouch if leaking, struggles  with pouch change.  Difficulty visualizing stoma for application Plan  See back as needed    Visit time: 35 minutes.   Maple HudsonKaren Kavish Lafitte FNP-BC

## 2022-07-14 NOTE — Discharge Instructions (Signed)
Follow up with pre surgical clearance

## 2022-07-17 ENCOUNTER — Other Ambulatory Visit: Payer: Self-pay | Admitting: Family Medicine

## 2022-07-17 ENCOUNTER — Ambulatory Visit (HOSPITAL_COMMUNITY)
Admission: RE | Admit: 2022-07-17 | Discharge: 2022-07-17 | Disposition: A | Discharge status: Home / Self Care | Payer: Medicare Other | Source: Ambulatory Visit | Attending: Nurse Practitioner | Admitting: Nurse Practitioner

## 2022-07-17 ENCOUNTER — Telehealth: Payer: Self-pay | Admitting: Family Medicine

## 2022-07-17 DIAGNOSIS — R918 Other nonspecific abnormal finding of lung field: Secondary | ICD-10-CM | POA: Diagnosis not present

## 2022-07-17 DIAGNOSIS — K9400 Colostomy complication, unspecified: Secondary | ICD-10-CM

## 2022-07-17 DIAGNOSIS — Z433 Encounter for attention to colostomy: Secondary | ICD-10-CM | POA: Diagnosis not present

## 2022-07-17 DIAGNOSIS — J849 Interstitial pulmonary disease, unspecified: Secondary | ICD-10-CM | POA: Diagnosis not present

## 2022-07-17 MED ORDER — BENZONATATE 100 MG PO CAPS: 100.0000 mg | ORAL_CAPSULE | Freq: Three times a day (TID) | ORAL | 0 refills | Status: DC | PRN

## 2022-07-17 NOTE — Telephone Encounter (Signed)
Patient called stating she is out of her cough medicine and she needs refill. Unable to find on her recent medications. Please advice.  Promethazinetam

## 2022-07-17 NOTE — Telephone Encounter (Signed)
Please advise. Med not on list anymore

## 2022-07-17 NOTE — Progress Notes (Signed)
[facility]   Reason for visit:  LLQ colostomy HPI:  Diverticulitis with perforation and end colostomy Past Medical History:  Diagnosis Date   Arthritis    Bronchial pneumonia    Colon polyp    Diverticulitis    DVT (deep venous thrombosis)    lower extremity   H/O cardiac catheterization 2004   Normal coronary arteries   Heart murmur    History of stress test 05/21/2011   Hx of echocardiogram 01/27/2010   Normal Ef 55% the transmitral spectral doppler flow pattern is normal for age. the left ventricular wall motion is normal   Hypothyroidism    IPF (idiopathic pulmonary fibrosis) 01/2018   Myocardial infarction    hx of 30 years ago   Pancreatitis    Thyroid disease    TIA (transient ischemic attack)    8 years ago   Family History  Problem Relation Age of Onset   Ovarian cancer Mother    Uterine cancer Mother    Lung cancer Father    HIV Brother        80   Kidney cancer Brother    Lung cancer Brother    Other Brother        Mouth Cancer   Colon cancer Maternal Aunt    Heart disease Maternal Grandmother    Stroke Maternal Grandmother    Hypertension Maternal Grandmother    Diabetes Maternal Grandmother    Arthritis Other    Esophageal cancer Neg Hx    Liver disease Neg Hx    Rectal cancer Neg Hx    Stomach cancer Neg Hx    Allergies  Allergen Reactions   Prednisone Other (See Comments)    Changed personality    Dilaudid [Hydromorphone] Itching and Other (See Comments)    Dilaudid caused marked confusion   Atrovent Hfa [Ipratropium Bromide Hfa] Itching and Other (See Comments)    Pt could not sleep   Cheratussin Ac [Guaifenesin-Codeine] Other (See Comments)    Headaches    Diltiazem Itching and Other (See Comments)    Makes patient sick. Shakes. GI   Hydrocodone Itching   Influenza Vaccines Other (See Comments)    Pt reports heart attack after last flu shot   Morphine Itching   Motrin [Ibuprofen] Itching and Other (See Comments)     "Gives false reading in blood"   Oxycodone Itching   Codeine Itching, Nausea Only and Other (See Comments)    Pt takes promethazine with codeine at home   Current Outpatient Medications  Medication Sig Dispense Refill Last Dose   benzonatate (TESSALON PERLES) 100 MG capsule Take 1 capsule (100 mg total) by mouth 3 (three) times daily as needed for cough. 20 capsule 0    amoxicillin-clavulanate (AUGMENTIN) 875-125 MG tablet Take 1 tablet by mouth 2 (two) times daily. 20 tablet 0    aspirin EC 81 MG tablet Take 81 mg by mouth daily. Swallow whole.      OFEV 100 MG CAPS Take 100 mg by mouth daily.      sodium chloride (OCEAN) 0.65 % SOLN nasal spray Place 1 spray into both nostrils as needed for congestion.      SYNTHROID 112 MCG tablet TAKE 1 TABLET BY MOUTH DAILY BEFORE BREAKFAST. 90 tablet 0    vitamin C (ASCORBIC ACID) 500 MG tablet Take 500 mg by mouth daily.      No current facility-administered medications for this encounter.   ROS  Review of Systems  Respiratory:  Fluid noted on lungs History bronchial pneumonia Chronic cough  Gastrointestinal:        LLQ colostomy Constipation at times  Skin:  Positive for color change.       Peristomal skin red  Psychiatric/Behavioral: Negative.    All other systems reviewed and are negative.  Vital signs:  BP (!) 141/64   Pulse 69   Temp 97.7 F (36.5 C) (Oral)   Resp 18   SpO2 90%  Exam:  Physical Exam Vitals reviewed.  Constitutional:      Appearance: Normal appearance.  Pulmonary:     Comments: Chronic cough Abdominal:     Palpations: Abdomen is soft.  Skin:    General: Skin is warm and dry.     Findings: Erythema present.  Neurological:     Mental Status: She is alert and oriented to person, place, and time.  Psychiatric:        Mood and Affect: Mood normal.        Behavior: Behavior normal.     Stoma type/location:  LLQ colostomy Stomal assessment/size:  1 1/4" round and flush Peristomal assessment:   intact Treatment options for stomal/peristomal skin: barrier ring and 2 piece pouch Output: soft brown stool  with miralax Ostomy pouching: 2pc.  Education provided:  reversal has been delayed due to finding of fluid on the lungs  History bronchial pneumonia.  Discussed potential complications of surgery with lung implications and that being cautious is appropriate. Patient is disappointed but understands and is accepting.   No shortness of breath, chronic cough noted    Impression/dx  colostomy Discussion  See above  continue twice weekly pouch changes Plan  See back as needed.     Visit time: 40 minutes.   Maple Hudson FNP-BC

## 2022-07-18 DIAGNOSIS — J301 Allergic rhinitis due to pollen: Secondary | ICD-10-CM | POA: Diagnosis not present

## 2022-07-18 DIAGNOSIS — J0140 Acute pansinusitis, unspecified: Secondary | ICD-10-CM | POA: Diagnosis not present

## 2022-07-18 DIAGNOSIS — R030 Elevated blood-pressure reading, without diagnosis of hypertension: Secondary | ICD-10-CM | POA: Diagnosis not present

## 2022-07-20 ENCOUNTER — Telehealth: Payer: Self-pay

## 2022-07-20 ENCOUNTER — Other Ambulatory Visit: Payer: Self-pay | Admitting: Family Medicine

## 2022-07-20 ENCOUNTER — Encounter: Payer: Self-pay | Admitting: *Deleted

## 2022-07-20 DIAGNOSIS — J440 Chronic obstructive pulmonary disease with acute lower respiratory infection: Secondary | ICD-10-CM

## 2022-07-20 NOTE — Telephone Encounter (Signed)
Spoke with patient and she will be in tomorrow to get xray.

## 2022-07-20 NOTE — Telephone Encounter (Signed)
Pt called she is wanting to know if Dr. Laury Axon can order her a chest x-ray, she is scheduled for surgery on 08/13/22. She had a chest x-ray at [facility] (she states last week, however last noted on care everywhere was 3/29) and they are unable to give her the results until the end of the week. Their office is informing her that they are unable to find her x-rays and she states she doesn't know what this means. She is upset about this. She is worried about having pneumonia, her surgery has already been rescheduled once. I did inform that I thought she needed to call ordering providers office again, someone should be covering for that provider and should be able to give those results. Pt kept interrupting me. Finally, I told her that I'd send a message back to Dr. Laury Axon.

## 2022-07-21 ENCOUNTER — Ambulatory Visit (HOSPITAL_COMMUNITY)
Admission: RE | Admit: 2022-07-21 | Discharge: 2022-07-21 | Disposition: A | Discharge status: Home / Self Care | Payer: Medicare Other | Source: Ambulatory Visit

## 2022-07-21 ENCOUNTER — Ambulatory Visit (HOSPITAL_BASED_OUTPATIENT_CLINIC_OR_DEPARTMENT_OTHER)
Admission: RE | Admit: 2022-07-21 | Discharge: 2022-07-21 | Disposition: A | Discharge status: Home / Self Care | Payer: Medicare Other | Source: Ambulatory Visit | Attending: Family Medicine | Admitting: Family Medicine

## 2022-07-21 DIAGNOSIS — J449 Chronic obstructive pulmonary disease, unspecified: Secondary | ICD-10-CM | POA: Diagnosis not present

## 2022-07-21 DIAGNOSIS — J440 Chronic obstructive pulmonary disease with acute lower respiratory infection: Secondary | ICD-10-CM | POA: Diagnosis not present

## 2022-07-21 DIAGNOSIS — R059 Cough, unspecified: Secondary | ICD-10-CM | POA: Diagnosis not present

## 2022-07-23 DIAGNOSIS — Z01818 Encounter for other preprocedural examination: Secondary | ICD-10-CM

## 2022-07-24 ENCOUNTER — Ambulatory Visit (HOSPITAL_COMMUNITY)
Admission: RE | Admit: 2022-07-24 | Discharge: 2022-07-24 | Disposition: A | Discharge status: Home / Self Care | Payer: Medicare Other | Source: Ambulatory Visit | Attending: Nurse Practitioner | Admitting: Nurse Practitioner

## 2022-07-24 DIAGNOSIS — Z933 Colostomy status: Secondary | ICD-10-CM | POA: Diagnosis present

## 2022-07-24 DIAGNOSIS — Z433 Encounter for attention to colostomy: Secondary | ICD-10-CM | POA: Diagnosis not present

## 2022-07-24 DIAGNOSIS — K9400 Colostomy complication, unspecified: Secondary | ICD-10-CM | POA: Diagnosis not present

## 2022-07-24 LAB — TYPE AND SCREEN
ABO/RH(D): A NEG
Antibody Screen: POSITIVE
DAT, IgG: NEGATIVE
PT AG Type: NEGATIVE
Unit division: 0
Unit division: 0

## 2022-07-24 LAB — BPAM RBC
Blood Product Expiration Date: 202405022359
Blood Product Expiration Date: 202405022359
Unit Type and Rh: 600
Unit Type and Rh: 600

## 2022-07-24 NOTE — Discharge Instructions (Signed)
Try beano Continue miralax for constipation

## 2022-07-24 NOTE — Progress Notes (Signed)
[facility]   Reason for visit:  LLQ colostomy  Anticipating reversal in a couple weeks.  Still having leaks and "blow outs" due to gas and thick stool.  Managing with miralax.  Suggested beano for gas and she may try HPI:  Perforated diverticulum with end colostomy Past Medical History:  Diagnosis Date   Arthritis    Bronchial pneumonia    Colon polyp    Diverticulitis    DVT (deep venous thrombosis)    lower extremity   H/O cardiac catheterization 2004   Normal coronary arteries   Heart murmur    History of stress test 05/21/2011   Hx of echocardiogram 01/27/2010   Normal Ef 55% the transmitral spectral doppler flow pattern is normal for age. the left ventricular wall motion is normal   Hypothyroidism    IPF (idiopathic pulmonary fibrosis) 01/2018   Myocardial infarction    hx of 30 years ago   Pancreatitis    Thyroid disease    TIA (transient ischemic attack)    8 years ago   Family History  Problem Relation Age of Onset   Ovarian cancer Mother    Uterine cancer Mother    Lung cancer Father    HIV Brother        93   Kidney cancer Brother    Lung cancer Brother    Other Brother        Mouth Cancer   Colon cancer Maternal Aunt    Heart disease Maternal Grandmother    Stroke Maternal Grandmother    Hypertension Maternal Grandmother    Diabetes Maternal Grandmother    Arthritis Other    Esophageal cancer Neg Hx    Liver disease Neg Hx    Rectal cancer Neg Hx    Stomach cancer Neg Hx    Allergies  Allergen Reactions   Prednisone Other (See Comments)    Changed personality    Dilaudid [Hydromorphone] Itching and Other (See Comments)    Dilaudid caused marked confusion   Atrovent Hfa [Ipratropium Bromide Hfa] Itching and Other (See Comments)    Pt could not sleep   Cheratussin Ac [Guaifenesin-Codeine] Other (See Comments)    Headaches    Diltiazem Itching and Other (See Comments)    Makes patient sick. Shakes. GI   Hydrocodone Itching    Influenza Vaccines Other (See Comments)    Pt reports heart attack after last flu shot   Morphine Itching   Motrin [Ibuprofen] Itching and Other (See Comments)    "Gives false reading in blood"   Oxycodone Itching   Codeine Itching, Nausea Only and Other (See Comments)    Pt takes promethazine with codeine at home   Current Outpatient Medications  Medication Sig Dispense Refill Last Dose   benzonatate (TESSALON PERLES) 100 MG capsule Take 1 capsule (100 mg total) by mouth 3 (three) times daily as needed for cough. 20 capsule 0    amoxicillin-clavulanate (AUGMENTIN) 875-125 MG tablet Take 1 tablet by mouth 2 (two) times daily. 20 tablet 0    aspirin EC 81 MG tablet Take 81 mg by mouth daily. Swallow whole.      OFEV 100 MG CAPS Take 100 mg by mouth daily.      sodium chloride (OCEAN) 0.65 % SOLN nasal spray Place 1 spray into both nostrils as needed for congestion.      SYNTHROID 112 MCG tablet TAKE 1 TABLET BY MOUTH DAILY BEFORE BREAKFAST. 90 tablet 0    vitamin C (  ASCORBIC ACID) 500 MG tablet Take 500 mg by mouth daily.      No current facility-administered medications for this encounter.   ROS  Review of Systems  Gastrointestinal:        LLQ colostomy   Skin:  Positive for color change.       Peristomal skin red, intact    All other systems reviewed and are negative.  Vital signs:  BP 114/87 (BP Location: Right Arm)   Pulse (!) 50   Temp 97.7 F (36.5 C) (Oral)   Resp 20   SpO2 91%  Exam:  Physical Exam Vitals reviewed.  Constitutional:      Appearance: Normal appearance.  Abdominal:     Palpations: Abdomen is soft.  Skin:    General: Skin is warm and dry.     Findings: Erythema present.  Neurological:     Mental Status: She is alert and oriented to person, place, and time.  Psychiatric:        Mood and Affect: Mood normal.        Behavior: Behavior normal.     Stoma type/location:  LLQ colostomy Stomal assessment/size:  oval, 1" pink and moist Peristomal  assessment:  intact Treatment options for stomal/peristomal skin: barrier ring, 2 piece convex system Output: thick brown stool Ostomy pouching: 2pc. Convex with barrier ring Education provided:  pouch change performed  discussed beano for gas.  She has a filtered pouch in place, but It does not manage her gas well    Impression/dx  colostomy Discussion  Pouch change, discuss thick stool (constipation) Plan  See back next week.      Visit time: 45 minutes.   Maple Hudson FNP-BC

## 2022-07-27 ENCOUNTER — Ambulatory Visit (HOSPITAL_COMMUNITY)
Admission: RE | Admit: 2022-07-27 | Discharge: 2022-07-27 | Disposition: A | Discharge status: Home / Self Care | Payer: Medicare Other | Source: Ambulatory Visit | Attending: Nurse Practitioner | Admitting: Nurse Practitioner

## 2022-07-27 DIAGNOSIS — Z933 Colostomy status: Secondary | ICD-10-CM | POA: Insufficient documentation

## 2022-07-27 DIAGNOSIS — K5900 Constipation, unspecified: Secondary | ICD-10-CM | POA: Insufficient documentation

## 2022-07-27 DIAGNOSIS — K5901 Slow transit constipation: Secondary | ICD-10-CM | POA: Diagnosis not present

## 2022-07-27 DIAGNOSIS — K9400 Colostomy complication, unspecified: Secondary | ICD-10-CM

## 2022-07-27 NOTE — Progress Notes (Signed)
[facility]   Reason for visit:  LLQ colostomy HPI:  Perforated diverticulum Past Medical History:  Diagnosis Date   Arthritis    Bronchial pneumonia    Colon polyp    Diverticulitis    DVT (deep venous thrombosis)    lower extremity   H/O cardiac catheterization 2004   Normal coronary arteries   Heart murmur    History of stress test 05/21/2011   Hx of echocardiogram 01/27/2010   Normal Ef 55% the transmitral spectral doppler flow pattern is normal for age. the left ventricular wall motion is normal   Hypothyroidism    IPF (idiopathic pulmonary fibrosis) 01/2018   Myocardial infarction    hx of 30 years ago   Pancreatitis    Thyroid disease    TIA (transient ischemic attack)    8 years ago   Family History  Problem Relation Age of Onset   Ovarian cancer Mother    Uterine cancer Mother    Lung cancer Father    HIV Brother        46   Kidney cancer Brother    Lung cancer Brother    Other Brother        Mouth Cancer   Colon cancer Maternal Aunt    Heart disease Maternal Grandmother    Stroke Maternal Grandmother    Hypertension Maternal Grandmother    Diabetes Maternal Grandmother    Arthritis Other    Esophageal cancer Neg Hx    Liver disease Neg Hx    Rectal cancer Neg Hx    Stomach cancer Neg Hx    Allergies  Allergen Reactions   Prednisone Other (See Comments)    Changed personality    Dilaudid [Hydromorphone] Itching and Other (See Comments)    Dilaudid caused marked confusion   Atrovent Hfa [Ipratropium Bromide Hfa] Itching and Other (See Comments)    Pt could not sleep   Cheratussin Ac [Guaifenesin-Codeine] Other (See Comments)    Headaches    Diltiazem Itching and Other (See Comments)    Makes patient sick. Shakes. GI   Hydrocodone Itching   Influenza Vaccines Other (See Comments)    Pt reports heart attack after last flu shot   Morphine Itching   Motrin [Ibuprofen] Itching and Other (See Comments)    "Gives false reading in  blood"   Oxycodone Itching   Codeine Itching, Nausea Only and Other (See Comments)    Pt takes promethazine with codeine at home   Current Outpatient Medications  Medication Sig Dispense Refill Last Dose   benzonatate (TESSALON PERLES) 100 MG capsule Take 1 capsule (100 mg total) by mouth 3 (three) times daily as needed for cough. 20 capsule 0    amoxicillin-clavulanate (AUGMENTIN) 875-125 MG tablet Take 1 tablet by mouth 2 (two) times daily. 20 tablet 0    aspirin EC 81 MG tablet Take 81 mg by mouth daily. Swallow whole.      OFEV 100 MG CAPS Take 100 mg by mouth daily.      sodium chloride (OCEAN) 0.65 % SOLN nasal spray Place 1 spray into both nostrils as needed for congestion.      SYNTHROID 112 MCG tablet TAKE 1 TABLET BY MOUTH DAILY BEFORE BREAKFAST. 90 tablet 0    vitamin C (ASCORBIC ACID) 500 MG tablet Take 500 mg by mouth daily.      No current facility-administered medications for this encounter.   ROS  Review of Systems  Gastrointestinal:  Positive for constipation.  Colostomy  Skin:  Positive for color change.  All other systems reviewed and are negative.  Vital signs:  BP (!) 157/71 (BP Location: Right Arm)   Pulse 60   Temp 98.3 F (36.8 C) (Oral)   Resp 18   SpO2 94%  Exam:  Physical Exam Vitals reviewed.  Constitutional:      Appearance: Normal appearance.  Abdominal:     Palpations: Abdomen is soft.  Skin:    General: Skin is warm and dry.     Findings: Erythema present.  Neurological:     Mental Status: She is alert and oriented to person, place, and time.  Psychiatric:        Mood and Affect: Mood normal.        Behavior: Behavior normal.     Stoma type/location:  LLQ colostomy Stomal assessment/size:  1 1/4" pink and moist Peristomal assessment:  intact Treatment options for stomal/peristomal skin: barrier ring  2 piece pouch Output: soft brown stool  some constipation  takes Miralax Ostomy pouching: 2pc. Pouch with barrier ring Education  provided:  perform pouch change  encouraged water intake, physical activity.  May use beano for gas    Impression/dx  Colostomy constipation Discussion  See back one week Plan  Awaiting reversal in 2 weeks.     Visit time: 35 minutes.   Maple Hudson FNP-BC

## 2022-07-29 DIAGNOSIS — H43813 Vitreous degeneration, bilateral: Secondary | ICD-10-CM | POA: Diagnosis not present

## 2022-07-30 DIAGNOSIS — Z124 Encounter for screening for malignant neoplasm of cervix: Secondary | ICD-10-CM | POA: Diagnosis not present

## 2022-07-30 DIAGNOSIS — Z6824 Body mass index (BMI) 24.0-24.9, adult: Secondary | ICD-10-CM | POA: Diagnosis not present

## 2022-07-30 DIAGNOSIS — Z1272 Encounter for screening for malignant neoplasm of vagina: Secondary | ICD-10-CM | POA: Diagnosis not present

## 2022-07-30 DIAGNOSIS — Z1231 Encounter for screening mammogram for malignant neoplasm of breast: Secondary | ICD-10-CM | POA: Diagnosis not present

## 2022-07-30 DIAGNOSIS — Z1151 Encounter for screening for human papillomavirus (HPV): Secondary | ICD-10-CM | POA: Diagnosis not present

## 2022-07-31 ENCOUNTER — Ambulatory Visit (HOSPITAL_COMMUNITY)
Admission: RE | Admit: 2022-07-31 | Discharge: 2022-07-31 | Disposition: A | Discharge status: Home / Self Care | Payer: Medicare Other | Source: Ambulatory Visit | Attending: Family Medicine | Admitting: Family Medicine

## 2022-07-31 DIAGNOSIS — Z433 Encounter for attention to colostomy: Secondary | ICD-10-CM | POA: Diagnosis not present

## 2022-07-31 NOTE — Discharge Instructions (Signed)
See back as needed.  

## 2022-07-31 NOTE — Progress Notes (Signed)
[facility]   Reason for visit:  LLQ colostomy  anticipating reversal soon HPI:  Diverticulitis with rupture, end colostomy Past Medical History:  Diagnosis Date   Arthritis    Bronchial pneumonia    Colon polyp    Diverticulitis    DVT (deep venous thrombosis) (HCC)    lower extremity   H/O cardiac catheterization 2004   Normal coronary arteries   Heart murmur    History of stress test 05/21/2011   Hx of echocardiogram 01/27/2010   Normal Ef 55% the transmitral spectral doppler flow pattern is normal for age. the left ventricular wall motion is normal   Hypothyroidism    IPF (idiopathic pulmonary fibrosis) (HCC) 01/2018   Myocardial infarction (HCC)    hx of 30 years ago   Pancreatitis    Thyroid disease    TIA (transient ischemic attack)    8 years ago   Family History  Problem Relation Age of Onset   Ovarian cancer Mother    Uterine cancer Mother    Lung cancer Father    HIV Brother        42   Kidney cancer Brother    Lung cancer Brother    Other Brother        Mouth Cancer   Colon cancer Maternal Aunt    Heart disease Maternal Grandmother    Stroke Maternal Grandmother    Hypertension Maternal Grandmother    Diabetes Maternal Grandmother    Arthritis Other    Esophageal cancer Neg Hx    Liver disease Neg Hx    Rectal cancer Neg Hx    Stomach cancer Neg Hx    Allergies  Allergen Reactions   Prednisone Other (See Comments)    Changed personality    Dilaudid [Hydromorphone] Itching and Other (See Comments)    Dilaudid caused marked confusion   Atrovent Hfa [Ipratropium Bromide Hfa] Itching and Other (See Comments)    Pt could not sleep   Cheratussin Ac [Guaifenesin-Codeine] Other (See Comments)    Headaches    Diltiazem Itching and Other (See Comments)    Makes patient sick. Shakes. GI   Hydrocodone Itching   Influenza Vaccines Other (See Comments)    Pt reports heart attack after last flu shot   Morphine Itching   Motrin  [Ibuprofen] Itching and Other (See Comments)    "Gives false reading in blood"   Oxycodone Itching   Codeine Itching, Nausea Only and Other (See Comments)    Pt takes promethazine with codeine at home   Current Outpatient Medications  Medication Sig Dispense Refill Last Dose   benzonatate (TESSALON PERLES) 100 MG capsule Take 1 capsule (100 mg total) by mouth 3 (three) times daily as needed for cough. 20 capsule 0    amoxicillin-clavulanate (AUGMENTIN) 875-125 MG tablet Take 1 tablet by mouth 2 (two) times daily. 20 tablet 0    aspirin EC 81 MG tablet Take 81 mg by mouth daily. Swallow whole.      OFEV 100 MG CAPS Take 100 mg by mouth daily.      sodium chloride (OCEAN) 0.65 % SOLN nasal spray Place 1 spray into both nostrils as needed for congestion.      SYNTHROID 112 MCG tablet TAKE 1 TABLET BY MOUTH DAILY BEFORE BREAKFAST. 90 tablet 0    vitamin C (ASCORBIC ACID) 500 MG tablet Take 500 mg by mouth daily.      No current facility-administered medications for this encounter.   ROS  Review of Systems  Gastrointestinal:        LLQ colostomy  Psychiatric/Behavioral: Negative.    All other systems reviewed and are negative.  Vital signs:  BP (!) 174/80 (BP Location: Right Arm)   Pulse 66   Temp 98.1 F (36.7 C) (Oral)   Resp 20   SpO2 94%  Exam:  Physical Exam Vitals reviewed.  Constitutional:      Appearance: Normal appearance.  Pulmonary:     Comments: Recent scan showed fluid in lungs.  Asymptomatic Abdominal:     Palpations: Abdomen is soft.  Skin:    General: Skin is warm and dry.  Neurological:     Mental Status: She is alert and oriented to person, place, and time.  Psychiatric:        Mood and Affect: Mood normal.        Behavior: Behavior normal.     Stoma type/location:  LLQ colostomy Stomal assessment/size:  1 1/4" slightly oval Peristomal assessment:  intact Treatment options for stomal/peristomal skin: barrier ring and 2 piece pouch Output: soft brown  stool  occasional thick stool  mostly soft  Ostomy pouching: 2pc.  Education provided:  see back one week.     Impression/dx  Colostomy complication Discussion  See back as needed Plan  Pouch changes twice weekly  Supplies are adequate    Visit time: 45 minutes.   Maple Hudson FNP-BC

## 2022-08-01 NOTE — Progress Notes (Signed)
COVID Vaccine received:  [x]  No []  Yes Date of any COVID positive Test in last 90 days:  None  PCP - Seabron Spates, DO Cardiologist - Thurmon Fair, MD & Thomasene Ripple, DO Pulmonology- Lysle Pearl, PA-C Phone: [phone]  Fax: [phone]   Chest x-ray - 07-24-2022  Epic EKG -06-08-2022  Epic   Stress Test -  ECHO -07-09-2022  Epic  Cardiac Cath - 10-25-2013 Epic by Dr. Tresa Endo PFT- 05-08-2020  PCR screen: []  Ordered & Completed           [x]   No Order but Needs PROFEND           []   N/A for this surgery  Surgery Plan:  []  Ambulatory                            []  Outpatient in bed                            [x]  Admit  Anesthesia:    [x]  General  []  Spinal                           []   Choice []   MAC  Bowel Prep - []  No  [x]   Yes __Usual CCS prep  Pacemaker / ICD device [x]  No []  Yes   Spinal Cord Stimulator:[x]  No []  Yes       History of Sleep Apnea? [x]  No []  Yes   CPAP used?- [x]  No []  Yes    Does the patient monitor blood sugar?          []  No []  Yes  [x]  N/A  Patient has: [x]  NO Hx DM   []  Pre-DM                 []  DM1  []   DM2  Blood Thinner / Instructions:None Aspirin Instructions:  ASA 81 mg Hold 5-7 days, patient is aware  ERAS Protocol Ordered: []  No  [x]  Yes PRE-SURGERY []  ENSURE  [x]  G2 X3 per patient preference, "Ensure is Too Sweet " Patient is to be NPO after: 0800  Comments: Patient was Rx'd Zofran for nausea associated with bowel prep, she is aware.   Activity level: Patient is able to climb a flight of stairs without difficulty; [x]  No CP  [x]  No SOB.  Patient can perform ADLs without assistance.   Anesthesia review: pulmonary fibrosis, Heart Murmur, Hx DVT/ TIA, bradycardia, anemia, RBBB, CHF  Patient denies shortness of breath, fever, cough and chest pain at PAT appointment.  Patient verbalized understanding and agreement to the Pre-Surgical Instructions that were given to them at this PAT appointment. Patient was also educated of the need  to review these PAT instructions again prior to her surgery.I reviewed the appropriate phone numbers to call if they have any and questions or concerns.

## 2022-08-01 NOTE — Patient Instructions (Signed)
SURGICAL WAITING ROOM VISITATION Patients having surgery or a procedure may have no more than 2 support people in the waiting area - these visitors may rotate in the visitor waiting room.   Due to an increase in RSV and influenza rates and associated hospitalizations, children ages 8 and under may not visit patients in [facility] hospitals. If the patient needs to stay at the hospital during part of their recovery, the visitor guidelines for inpatient rooms apply.  PRE-OP VISITATION  Pre-op nurse will coordinate an appropriate time for 1 support person to accompany the patient in pre-op.  This support person may not rotate.  This visitor will be contacted when the time is appropriate for the visitor to come back in the pre-op area.  Please refer to the [facility] website for the visitor guidelines for Inpatients (after your surgery is over and you are in a regular room).  You are not required to quarantine at this time prior to your surgery. However, you must do this: Hand Hygiene often Do NOT share personal items Notify your provider if you are in close contact with someone who has COVID or you develop fever 100.4 or greater, new onset of sneezing, cough, sore throat, shortness of breath or body aches.  If you test positive for Covid or have been in contact with anyone that has tested positive in the last 10 days please notify you surgeon.    Your procedure is scheduled on:  Thursday  Aug 13, 2022  Report to [facility] Main Entrance: Le[facility]sen entrance where the [employer] is available.   Report to admitting at: 08:30  AM  +++++Call this number if you have any questions or problems the morning of surgery [phone]  Do not eat food after Midnight the night prior to your surgery/procedure.  After Midnight you may have the following liquids until 08:00 AM DAY OF SURGERY  Clear Liquid Diet Water Black Coffee (sugar ok, NO MILK/CREAM OR CREAMERS)  Tea (sugar ok, NO  MILK/CREAM OR CREAMERS) regular and decaf                             Plain Jell-O  with no fruit (NO RED)                                           Fruit ices (not with fruit pulp, NO RED)                                     Popsicles (NO RED)                                                                  Juice: apple, WHITE grape, WHITE cranberry Sports drinks like Gatorade or Powerade (NO RED)             DRINK two (2) bottles of Pre-Surgery G2 starting at 6:00 pm the evening prior to your surgery to help prevent dehydration. Increase drinking clear fluids (see list above)  The day of surgery:  Drink ONE (1) Pre-Surgery G2 at  08:00  AM the morning of surgery. Drink in one sitting. Do not sip.  This drink was given to you during your hospital pre-op appointment visit. Nothing else to drink after completing the Pre-Surgery G2 : No candy, chewing gum or throat lozenges.    FOLLOW BOWEL PREP AND ANY ADDITIONAL PRE OP INSTRUCTIONS YOU RECEIVED FROM YOUR SURGEON'S OFFICE!!!  Dulcolax 20 mg (total) - Take 4 (four) of the 5 mg Dulcolax tablets with water at 07:00 am the day prior to surgery.  Miralax 255 g - Mix with 64 oz Gatorade/Powerade.  Starting at 10:00 am ,Drink this gradually over the next few hours (8 oz glass every 15-30 minutes) until gone the day prior to surgery You should finish in 4 hours-6 hours.    Neomycin 1000 mg - At 2 pm, 3 pm and 10 pm after Miralax  bowel prep the day prior to surgery.  Metronidazole 1000 mg - At 2 pm, 3 pm and 10 pm after Miralax bowel prep the day prior to surgery.   Drink plenty of clear liquids all evening to avoid getting dehydrated.     Oral Hygiene is also important to reduce your risk of infection.        Remember - BRUSH YOUR TEETH THE MORNING OF SURGERY WITH YOUR REGULAR TOOTHPASTE  Do NOT smoke after Midnight the night before surgery.  Take ONLY these medicines the morning of surgery with A SIP OF WATER:  Synthroid                   You may not have any metal on your body including hair pins, jewelry, and body piercing  Do not wear make-up, lotions, powders, perfumes or deodorant  Do not wear nail polish including gel and S&S, artificial / acrylic nails, or any other type of covering on natural nails including finger and toenails. If you have artificial nails, gel coating, etc., that needs to be removed by a nail salon, Please have this removed prior to surgery. Not doing so may mean that your surgery could be cancelled or delayed if the Surgeon or anesthesia staff feels like they are unable to monitor you safely.   Do not shave 48 hours prior to surgery to avoid nicks in your skin which may contribute to postoperative infections.    You may bring a small overnight bag with you on the day of surgery, only pack items that are not valuable. [facility] IS NOT RESPONSIBLE   FOR VALUABLES THAT ARE LOST OR STOLEN.   Do not bring your home medications to the hospital. The Pharmacy will dispense medications listed on your medication list to you during your admission in the Hospital.  Special Instructions: Bring a copy of your healthcare power of attorney and living will documents the day of surgery, if you wish to have them scanned into your [facility]- EPIC  Please read over the following fact sheets you were given: IF YOU HAVE QUESTIONS ABOUT YOUR PRE-OP INSTRUCTIONS, PLEASE CALL [phone].   [facility] - Preparing for Surgery Before surgery, you can play an important role.  Because skin is not sterile, your skin needs to be as free of germs as possible.  You can reduce the number of germs on your skin by washing with CHG (chlorahexidine gluconate) soap before surgery.  CHG is an antiseptic cleaner which kills germs and bonds with the skin to continue killing  germs even after washing. Please DO NOT use if you have an allergy to CHG or antibacterial soaps.  If your skin becomes  reddened/irritated stop using the CHG and inform your nurse when you arrive at Short Stay. Do not shave (including legs and underarms) for at least 48 hours prior to the first CHG shower.  You may shave your face/neck.  Please follow these instructions carefully:  1.  Shower with CHG Soap the night before surgery and the  morning of surgery.  2.  If you choose to wash your hair, wash your hair first as usual with your normal  shampoo.  3.  After you shampoo, rinse your hair and body thoroughly to remove the shampoo.                             4.  Use CHG as you would any other liquid soap.  You can apply chg directly to the skin and wash.  Gently with a scrungie or clean washcloth.  5.  Apply the CHG Soap to your body ONLY FROM THE NECK DOWN.   Do not use on face/ open                           Wound or open sores. Avoid contact with eyes, ears mouth and genitals (private parts).                       Wash face,  Genitals (private parts) with your normal soap.             6.  Wash thoroughly, paying special attention to the area where your  surgery  will be performed.  7.  Thoroughly rinse your body with warm water from the neck down.  8.  DO NOT shower/wash with your normal soap after using and rinsing off the CHG Soap.            9.  Pat yourself dry with a clean towel.            10.  Wear clean pajamas.            11.  Place clean sheets on your bed the night of your first shower and do not  sleep with pets.  ON THE DAY OF SURGERY : Do not apply any lotions/deodorants the morning of surgery.  Please wear clean clothes to the hospital/surgery center.    FAILURE TO FOLLOW THESE INSTRUCTIONS MAY RESULT IN THE CANCELLATION OF YOUR SURGERY  PATIENT SIGNATURE_________________________________  NURSE SIGNATURE__________________________________  ________________________________________________________________________        Alexandria [name]    An incentive spirometer is a tool  that can help keep your lungs clear and active. This tool measures how well you are filling your lungs with each breath. Taking long deep breaths may help reverse or decrease the chance of developing breathing (pulmonary) problems (especially infection) following: A long period of time when you are unable to move or be active. BEFORE THE PROCEDURE  If the spirometer includes an indicator to show your best effort, your nurse or respiratory therapist will set it to a desired goal. If possible, sit up straight or lean slightly forward. Try not to slouch. Hold the incentive spirometer in an upright position. INSTRUCTIONS FOR USE  Sit on the edge of your bed if possible, or sit up as far as you can in bed  or on a chair. Hold the incentive spirometer in an upright position. Breathe out normally. Place the mouthpiece in your mouth and seal your lips tightly around it. Breathe in slowly and as deeply as possible, raising the piston or the ball toward the top of the column. Hold your breath for 3-5 seconds or for as long as possible. Allow the piston or ball to fall to the bottom of the column. Remove the mouthpiece from your mouth and breathe out normally. Rest for a few seconds and repeat Steps 1 through 7 at least 10 times every 1-2 hours when you are awake. Take your time and take a few normal breaths between deep breaths. The spirometer may include an indicator to show your best effort. Use the indicator as a goal to work toward during each repetition. After each set of 10 deep breaths, practice coughing to be sure your lungs are clear. If you have an incision (the cut made at the time of surgery), support your incision when coughing by placing a pillow or rolled up towels firmly against it. Once you are able to get out of bed, walk around indoors and cough well. You may stop using the incentive spirometer when instructed by your caregiver.  RISKS AND COMPLICATIONS Take your time so you do not get  dizzy or light-headed. If you are in pain, you may need to take or ask for pain medication before doing incentive spirometry. It is harder to take a deep breath if you are having pain. AFTER USE Rest and breathe slowly and easily. It can be helpful to keep track of a log of your progress. Your caregiver can provide you with a simple table to help with this. If you are using the spirometer at home, follow these instructions: SEEK MEDICAL CARE IF:  You are having difficultly using the spirometer. You have trouble using the spirometer as often as instructed. Your pain medication is not giving enough relief while using the spirometer. You develop fever of 100.5 F (38.1 C) or higher.                                                                                                    SEEK IMMEDIATE MEDICAL CARE IF:  You cough up bloody sputum that had not been present before. You develop fever of 102 F (38.9 C) or greater. You develop worsening pain at or near the incision site. MAKE SURE YOU:  Understand these instructions. Will watch your condition. Will get help right away if you are not doing well or get worse. Document Released: 08/03/2006 Document Revised: 06/15/2011 Document Reviewed: 10/04/2006 [facility] Patient Information 2014 [address], [place].       WHAT IS A BLOOD TRANSFUSION? Blood Transfusion Information  A transfusion is the replacement of blood or some of its parts. Blood is made up of multiple cells which provide different functions. Red blood cells carry oxygen and are used for blood loss replacement. White blood cells fight against infection. Platelets control bleeding. Plasma helps clot blood. Other blood products are available for specialized needs, such as hemophilia or  other clotting disorders. BEFORE THE TRANSFUSION  Who gives blood for transfusions?  Healthy volunteers who are fully evaluated to make sure their blood is safe. This is blood bank  blood. Transfusion therapy is the safest it has ever been in the practice of medicine. Before blood is taken from a donor, a complete history is taken to make sure that person has no history of diseases nor engages in risky social behavior (examples are intravenous drug use or sexual activity with multiple partners). The donor's travel history is screened to minimize risk of transmitting infections, such as malaria. The donated blood is tested for signs of infectious diseases, such as HIV and hepatitis. The blood is then tested to be sure it is compatible with you in order to minimize the chance of a transfusion reaction. If you or a relative donates blood, this is often done in anticipation of surgery and is not appropriate for emergency situations. It takes many days to process the donated blood. RISKS AND COMPLICATIONS Although transfusion therapy is very safe and saves many lives, the main dangers of transfusion include:  Getting an infectious disease. Developing a transfusion reaction. This is an allergic reaction to something in the blood you were given. Every precaution is taken to prevent this. The decision to have a blood transfusion has been considered carefully by your caregiver before blood is given. Blood is not given unless the benefits outweigh the risks. AFTER THE TRANSFUSION Right after receiving a blood transfusion, you will usually feel much better and more energetic. This is especially true if your red blood cells have gotten low (anemic). The transfusion raises the level of the red blood cells which carry oxygen, and this usually causes an energy increase. The nurse administering the transfusion will monitor you carefully for complications. HOME CARE INSTRUCTIONS  No special instructions are needed after a transfusion. You may find your energy is better. Speak with your caregiver about any limitations on activity for underlying diseases you may have. SEEK MEDICAL CARE IF:  Your  condition is not improving after your transfusion. You develop redness or irritation at the intravenous (IV) site. SEEK IMMEDIATE MEDICAL CARE IF:  Any of the following symptoms occur over the next 12 hours: Shaking chills. You have a temperature by mouth above 102 F (38.9 C), not controlled by medicine. Chest, back, or muscle pain. People around you feel you are not acting correctly or are confused. Shortness of breath or difficulty breathing. Dizziness and fainting. You get a rash or develop hives. You have a decrease in urine output. Your urine turns a dark color or changes to pink, red, or brown. Any of the following symptoms occur over the next 10 days: You have a temperature by mouth above 102 F (38.9 C), not controlled by medicine. Shortness of breath. Weakness after normal activity. The white part of the eye turns yellow (jaundice). You have a decrease in the amount of urine or are urinating less often. Your urine turns a dark color or changes to pink, red, or brown. Document Released: 03/20/2000 Document Revised: 06/15/2011 Document Reviewed: 11/07/2007 [facility] Patient Information 2014 [place], [place].  _______________________________________________________________________

## 2022-08-03 ENCOUNTER — Encounter (HOSPITAL_COMMUNITY)
Admission: RE | Admit: 2022-08-03 | Discharge: 2022-08-03 | Disposition: A | Discharge status: Home / Self Care | Payer: Medicare Other | Source: Ambulatory Visit | Attending: *Deleted | Admitting: *Deleted

## 2022-08-03 ENCOUNTER — Encounter (HOSPITAL_COMMUNITY): Payer: Self-pay

## 2022-08-03 ENCOUNTER — Other Ambulatory Visit: Payer: Self-pay

## 2022-08-03 ENCOUNTER — Ambulatory Visit (HOSPITAL_COMMUNITY)
Admission: RE | Admit: 2022-08-03 | Discharge: 2022-08-03 | Disposition: A | Discharge status: Home / Self Care | Payer: Medicare Other | Source: Ambulatory Visit | Attending: Family Medicine | Admitting: Family Medicine

## 2022-08-03 VITALS — BP 160/88 | HR 68 | Temp 98.1°F | Resp 18 | Ht 63.75 in | Wt 139.0 lb

## 2022-08-03 DIAGNOSIS — K5909 Other constipation: Secondary | ICD-10-CM | POA: Diagnosis not present

## 2022-08-03 DIAGNOSIS — K5901 Slow transit constipation: Secondary | ICD-10-CM | POA: Diagnosis not present

## 2022-08-03 DIAGNOSIS — K9400 Colostomy complication, unspecified: Secondary | ICD-10-CM

## 2022-08-03 DIAGNOSIS — I10 Essential (primary) hypertension: Secondary | ICD-10-CM

## 2022-08-03 DIAGNOSIS — Z433 Encounter for attention to colostomy: Secondary | ICD-10-CM | POA: Insufficient documentation

## 2022-08-03 DIAGNOSIS — Z01818 Encounter for other preprocedural examination: Secondary | ICD-10-CM

## 2022-08-03 LAB — CBC
HCT: 42.6 % (ref 36.0–46.0)
Hemoglobin: 13.7 g/dL (ref 12.0–15.0)
MCH: 32.5 pg (ref 26.0–34.0)
MCHC: 32.2 g/dL (ref 30.0–36.0)
MCV: 100.9 fL — ABNORMAL HIGH (ref 80.0–100.0)
Platelets: 231 10*3/uL (ref 150–400)
RBC: 4.22 MIL/uL (ref 3.87–5.11)
RDW: 14.3 % (ref 11.5–15.5)
WBC: 6.1 10*3/uL (ref 4.0–10.5)
nRBC: 0 % (ref 0.0–0.2)

## 2022-08-03 LAB — BASIC METABOLIC PANEL
Anion gap: 9 (ref 5–15)
BUN: 16 mg/dL (ref 8–23)
CO2: 26 mmol/L (ref 22–32)
Calcium: 9.1 mg/dL (ref 8.9–10.3)
Chloride: 103 mmol/L (ref 98–111)
Creatinine, Ser: 0.82 mg/dL (ref 0.44–1.00)
GFR, Estimated: >60 mL/min (ref >60)
Glucose, Bld: 83 mg/dL (ref 70–99)
Potassium: 4.3 mmol/L (ref 3.5–5.1)
Sodium: 138 mmol/L (ref 135–145)

## 2022-08-03 NOTE — Progress Notes (Signed)
[facility]   Reason for visit:  LLQ colostomy  Anticipating reversal in a couple of weeks.  Preop appointment today HPI:  End colostomy Chronic constipation Past Medical History:  Diagnosis Date   Arthritis    Bronchial pneumonia    Colon polyp    Diverticulitis    DVT (deep venous thrombosis) (HCC)    lower extremity   H/O cardiac catheterization 2004   Normal coronary arteries   Heart murmur    History of stress test 05/21/2011   Hx of echocardiogram 01/27/2010   Normal Ef 55% the transmitral spectral doppler flow pattern is normal for age. the left ventricular wall motion is normal   Hypothyroidism    IPF (idiopathic pulmonary fibrosis) (HCC) 01/2018   Myocardial infarction (HCC)    hx of 30 years ago   Pancreatitis    Thyroid disease    TIA (transient ischemic attack)    8 years ago   Family History  Problem Relation Age of Onset   Ovarian cancer Mother    Uterine cancer Mother    Lung cancer Father    HIV Brother        63   Kidney cancer Brother    Lung cancer Brother    Other Brother        Mouth Cancer   Colon cancer Maternal Aunt    Heart disease Maternal Grandmother    Stroke Maternal Grandmother    Hypertension Maternal Grandmother    Diabetes Maternal Grandmother    Arthritis Other    Esophageal cancer Neg Hx    Liver disease Neg Hx    Rectal cancer Neg Hx    Stomach cancer Neg Hx    Allergies  Allergen Reactions   Prednisone Other (See Comments)    Changed personality    Dilaudid [Hydromorphone] Itching and Other (See Comments)    Dilaudid caused marked confusion   Atrovent Hfa [Ipratropium Bromide Hfa] Itching and Other (See Comments)    Pt could not sleep   Cheratussin Ac [Guaifenesin-Codeine] Other (See Comments)    Headaches    Diltiazem Itching and Other (See Comments)    Makes patient sick. Shakes. GI   Hydrocodone Itching   Influenza Vaccines Other (See Comments)    Pt reports heart attack after last flu shot    Morphine Itching   Motrin [Ibuprofen] Itching and Other (See Comments)    "Gives false reading in blood"   Oxycodone Itching   Codeine Itching, Nausea Only and Other (See Comments)    Pt takes promethazine with codeine at home   Current Outpatient Medications  Medication Sig Dispense Refill Last Dose   benzonatate (TESSALON PERLES) 100 MG capsule Take 1 capsule (100 mg total) by mouth 3 (three) times daily as needed for cough. 20 capsule 0    amoxicillin-clavulanate (AUGMENTIN) 875-125 MG tablet Take 1 tablet by mouth 2 (two) times daily. 20 tablet 0    aspirin EC 81 MG tablet Take 81 mg by mouth daily. Swallow whole.      OFEV 100 MG CAPS Take 100 mg by mouth daily.      sodium chloride (OCEAN) 0.65 % SOLN nasal spray Place 1 spray into both nostrils as needed for congestion.      SYNTHROID 112 MCG tablet TAKE 1 TABLET BY MOUTH DAILY BEFORE BREAKFAST. 90 tablet 0    vitamin C (ASCORBIC ACID) 500 MG tablet Take 500 mg by mouth daily.      No current facility-administered  medications for this encounter.   ROS  Review of Systems  Gastrointestinal:  Positive for constipation.       LLQ colostomy   Skin: Negative.   All other systems reviewed and are negative.  Vital signs:  BP (!) 169/90 (BP Location: Right Arm)   Pulse 76   Temp 98 F (36.7 C) (Oral)   Resp 18   SpO2 95%  Exam:  Physical Exam Vitals reviewed.  Constitutional:      Appearance: Normal appearance.  Abdominal:     Palpations: Abdomen is soft.  Skin:    General: Skin is warm and dry.  Neurological:     Mental Status: She is alert and oriented to person, place, and time.  Psychiatric:        Mood and Affect: Mood normal.        Behavior: Behavior normal.     Stoma type/location:  LLQ colostomy  flush and oval Stomal assessment/size:  1 1/4", slightly oval pink and moist Peristomal assessment:  intact Treatment options for stomal/peristomal skin: barrier ring and 2 piece pouch Output: thick pasty stool.   Hard balls of stool.  COntinues on miralax daily. I offer to perform enema through stoma, she has another appointment today and will let me know if she needs it later.  Ostomy pouching: 2pc. With barrier ring Education provided:  see above. Offered irrigation through enema set with cone.     Impression/dx  Colostomy constipation Discussion  POuch change Discussed reversal/upcoming surgery Offered enema/irrigation due to reports of abdominal pain with constipation  declined at this time.  Plan  See back as needed.     Visit time: 40 minutes.   Maple Hudson FNP-BC

## 2022-08-03 NOTE — Discharge Instructions (Signed)
Call clinic as needed.  Can perform enema/irrigation if constipation persists.

## 2022-08-04 ENCOUNTER — Other Ambulatory Visit: Payer: Self-pay | Admitting: Obstetrics and Gynecology

## 2022-08-04 DIAGNOSIS — R928 Other abnormal and inconclusive findings on diagnostic imaging of breast: Secondary | ICD-10-CM

## 2022-08-04 LAB — TYPE AND SCREEN: DAT, IgG: POSITIVE

## 2022-08-04 LAB — BPAM RBC

## 2022-08-04 NOTE — Progress Notes (Signed)
Patient's type & Screen came back as POSITIVE for ANTIBODIES. Her ostomy takedown is scheduled for 08-13-22. This message was routed to Dr. Michaell Cowing for review.   Rudean Haskell, BSN, CVRN-BC   Pre-Surgical Testing Nurse [facility]  [phone]

## 2022-08-05 NOTE — Progress Notes (Addendum)
Anesthesia chart review   Case: [id number] Date/Time: 08/13/22 1054   Procedures:      ROBOTIC OSTOMY TAKEDOWN     LYSIS OF ADHESION     RIGID PROCTOSCOPY     CYSTOSCOPY with FIREFLY INJECTION   Anesthesia type: General   Pre-op diagnosis: COLOSTOMY FOR COLON RESECTION. DESIRE FOR OSTOMY TAKEDOWN   Location: [facility] ROOM 02 / [facility]   Surgeons: Karie Soda, MD; Crist Fat, MD       DISCUSSION: 77 year old never smoker with history of hypothyroidism CAD, colostomy in place scheduled for above procedure 08/13/2022 with Dr. Berniece Salines and Dr. Viviann Spare gross.  Pt seen by cardiology 06/15/22 for evaluation of bradycardia.  Coronary angiography in 2015 did not show any evidence of stenosis. More recently coronary CTA showed a calcium score of 0 although she did have some mild aortic atherosclerosis. In regards to bradycardia, "Suspect that this is artifactual and related to "under counting" of premature beats when checking the peripheral pulse with an automatic blood pressure cuff for direct palpation of the pulse or pulse oximeter. She has not had symptoms of bradycardia. There is no EKG documentation of bradycardia during her prolonged hospitalization or multiple subsequent ER visits and ECGs. A few years ago and ". Euvolemic at this visit. Echo ordered.    Echo 07/09/2022 with EF 55-60%, mild mitral regurgitation, aortic regurg mild to moderate, no aortic stenosis.Patient last seen by cardiology 06/15/2022.  Per office visit note  Case previously canceled due to pneumonia.  Pt seen in Urgent Care 07/03/2022 with 4 days of fever, chills, cough. Chest xray with small right pleural effusion with adjacent airspace opacity. Treated for pneumonia with Levaquin. Seen by PCP on 07/07/2022, started on Augmentin for sinus infection. Follow up with pulmonology 07/09/2022. Advised to stay on Augmentin and recommend repeat xray later this month.   Patient seen in urgent care 07/18/2022 with continued cough,  congestion, postnasal drip.  Prescribed Tessalon and Bromfed.  Pt reports two weeks asymptomatic at PAT visit. Pt will be 8 weeks out from pneumonia dx and greater than 2 weeks asymptomatic at time of scheduled procedure.  Advised to contact surgeon if she develops new sx. Evaluate DOS.  VS: BP (!) 160/88 Comment: right arm sitting  Pulse 68   Temp 36.7 C (Oral)   Resp 18   Ht 5' 3.75" (1.619 m)   Wt 63 kg   SpO2 98%   BMI 24.05 kg/m   PROVIDERS: Donato Schultz, DO is PCP  Cardiologist - Thurmon Fair, MD  LABS: Labs reviewed: Acceptable for surgery. (all labs ordered are listed, but only abnormal results are displayed)  Labs Reviewed  CBC - Abnormal; Notable for the following components:      Result Value   MCV 100.9 (*)    All other components within normal limits  BASIC METABOLIC PANEL  TYPE AND SCREEN     IMAGES:   EKG:   CV: Echo 07/09/2022 1. Left ventricular ejection fraction, by estimation, is 55 to 60%. The  left ventricle has normal function. The left ventricle has no regional  wall motion abnormalities. There is mild concentric left ventricular  hypertrophy. Left ventricular diastolic  parameters are consistent with Grade I diastolic dysfunction (impaired  relaxation).   2. Right ventricular systolic function is normal. The right ventricular  size is normal. Tricuspid regurgitation signal is inadequate for assessing  PA pressure.   3. The mitral valve is normal in structure. Mild mitral valve  regurgitation. No evidence of mitral stenosis. Moderate mitral annular  calcification.   4. The aortic valve is tricuspid. There is mild calcification of the  aortic valve. Aortic valve regurgitation is mild to moderate. No aortic  stenosis is present.   5. Aortic dilatation noted. There is mild dilatation of the ascending  aorta, measuring 42 mm.   6. IVC not visualized.   7. Frequent PACs noted  Past Medical History:  Diagnosis Date   Arthritis     Bronchial pneumonia    Colon polyp    Diverticulitis    DVT (deep venous thrombosis) (HCC)    lower extremity   H/O cardiac catheterization 2004   Normal coronary arteries   Heart murmur    History of stress test 05/21/2011   Hx of echocardiogram 01/27/2010   Normal Ef 55% the transmitral spectral doppler flow pattern is normal for age. the left ventricular wall motion is normal   Hypothyroidism    IPF (idiopathic pulmonary fibrosis) (HCC) 01/2018   Myocardial infarction (HCC)    hx of 30 years ago   Pancreatitis    Thyroid disease    TIA (transient ischemic attack)    8 years ago    Past Surgical History:  Procedure Laterality Date   BREAST BIOPSY Left    [facility]   CARDIAC CATHETERIZATION     10/2013   CATARACT EXTRACTION Bilateral 03/22/2018   LEFT HEART CATHETERIZATION WITH CORONARY ANGIOGRAM N/A 10/25/2013   Procedure: LEFT HEART CATHETERIZATION WITH CORONARY ANGIOGRAM;  Surgeon: Lennette Bihari, MD;  Location: [facility] CATH LAB;  Service: Cardiovascular;  Laterality: N/A;   NECK SURGERY     ACDF x 2      done in [place] more than 25 years, doesn't know levels   TOTAL KNEE ARTHROPLASTY     Bilateral x's 2   VAGINAL HYSTERECTOMY  10/17/1998   Ron Jennette Kettle   VIDEO BRONCHOSCOPY Bilateral 01/24/2018   Procedure: VIDEO BRONCHOSCOPY WITH FLUORO;  Surgeon: Lupita Leash, MD;  Location: [facility] ENDOSCOPY;  Service: Cardiopulmonary;  Laterality: Bilateral;   XI ROBOTIC ASSISTED LOWER ANTERIOR RESECTION N/A 03/31/2022   Procedure: XI ROBOTIC ASSISTED LOWER ANTERIOR RESECTION;  Surgeon: Karie Soda, MD;  Location: [facility];  Service: General;  Laterality: N/A;    MEDICATIONS:  benzonatate (TESSALON PERLES) 100 MG capsule   amoxicillin-clavulanate (AUGMENTIN) 875-125 MG tablet   aspirin EC 81 MG tablet   OFEV 100 MG CAPS   sodium chloride (OCEAN) 0.65 % SOLN nasal spray   SYNTHROID 112 MCG tablet   vitamin C (ASCORBIC ACID) 500 MG tablet   No current facility-administered  medications for this encounter.    Jodell Cipro Ward, PA-C [facility] [phone]

## 2022-08-07 ENCOUNTER — Ambulatory Visit (HOSPITAL_COMMUNITY)
Admission: RE | Admit: 2022-08-07 | Discharge: 2022-08-07 | Disposition: A | Discharge status: Home / Self Care | Payer: Medicare Other | Source: Ambulatory Visit | Attending: Nurse Practitioner | Admitting: Nurse Practitioner

## 2022-08-07 DIAGNOSIS — K9400 Colostomy complication, unspecified: Secondary | ICD-10-CM | POA: Diagnosis not present

## 2022-08-07 DIAGNOSIS — Z433 Encounter for attention to colostomy: Secondary | ICD-10-CM | POA: Diagnosis not present

## 2022-08-07 DIAGNOSIS — L24B3 Irritant contact dermatitis related to fecal or urinary stoma or fistula: Secondary | ICD-10-CM

## 2022-08-07 MED ORDER — SILVER NITRATE-POT NITRATE 75-25 % EX MISC
1.0000 | Freq: Once | CUTANEOUS | Status: DC
  Filled 2022-08-07: qty 1

## 2022-08-07 NOTE — Progress Notes (Signed)
[facility]   Reason for visit:  LLQ colostomy reversal in one week HPI:  Diverticulitis with perforation and end colostomy Past Medical History:  Diagnosis Date   Arthritis    Bronchial pneumonia    Colon polyp    Diverticulitis    DVT (deep venous thrombosis) (HCC)    lower extremity   H/O cardiac catheterization 2004   Normal coronary arteries   Heart murmur    History of stress test 05/21/2011   Hx of echocardiogram 01/27/2010   Normal Ef 55% the transmitral spectral doppler flow pattern is normal for age. the left ventricular wall motion is normal   Hypothyroidism    IPF (idiopathic pulmonary fibrosis) (HCC) 01/2018   Myocardial infarction (HCC)    hx of 30 years ago   Pancreatitis    Thyroid disease    TIA (transient ischemic attack)    8 years ago   Family History  Problem Relation Age of Onset   Ovarian cancer Mother    Uterine cancer Mother    Lung cancer Father    HIV Brother        85   Kidney cancer Brother    Lung cancer Brother    Other Brother        Mouth Cancer   Colon cancer Maternal Aunt    Heart disease Maternal Grandmother    Stroke Maternal Grandmother    Hypertension Maternal Grandmother    Diabetes Maternal Grandmother    Arthritis Other    Esophageal cancer Neg Hx    Liver disease Neg Hx    Rectal cancer Neg Hx    Stomach cancer Neg Hx    Allergies  Allergen Reactions   Prednisone Other (See Comments)    Changed personality    Dilaudid [Hydromorphone] Itching and Other (See Comments)    Dilaudid caused marked confusion   Atrovent Hfa [Ipratropium Bromide Hfa] Itching and Other (See Comments)    Pt could not sleep   Cheratussin Ac [Guaifenesin-Codeine] Other (See Comments)    Headaches    Diltiazem Itching and Other (See Comments)    Makes patient sick. Shakes. GI   Hydrocodone Itching   Influenza Vaccines Other (See Comments)    Pt reports heart attack after last flu shot   Morphine Itching   Motrin  [Ibuprofen] Itching and Other (See Comments)    "Gives false reading in blood"   Oxycodone Itching   Codeine Itching, Nausea Only and Other (See Comments)    Pt takes promethazine with codeine at home   Current Outpatient Medications  Medication Sig Dispense Refill Last Dose   benzonatate (TESSALON PERLES) 100 MG capsule Take 1 capsule (100 mg total) by mouth 3 (three) times daily as needed for cough. 20 capsule 0    amoxicillin-clavulanate (AUGMENTIN) 875-125 MG tablet Take 1 tablet by mouth 2 (two) times daily. 20 tablet 0    aspirin EC 81 MG tablet Take 81 mg by mouth daily. Swallow whole.      OFEV 100 MG CAPS Take 100 mg by mouth daily.      sodium chloride (OCEAN) 0.65 % SOLN nasal spray Place 1 spray into both nostrils as needed for congestion.      SYNTHROID 112 MCG tablet TAKE 1 TABLET BY MOUTH DAILY BEFORE BREAKFAST. 90 tablet 0    vitamin C (ASCORBIC ACID) 500 MG tablet Take 500 mg by mouth daily.      Current Facility-Administered Medications  Medication Dose Route Frequency Provider  Last Rate Last Admin   silver nitrate applicators applicator 1 Application  1 Application Topical Once , Eugenio Hoes, FNP       ROS  Review of Systems  Respiratory:         Chronic fluid in lungs (IPF)   Gastrointestinal:        LLQ colostomy Ongoing constipation  Skin:  Positive for color change.  All other systems reviewed and are negative.  Vital signs:  BP (!) 151/83 (BP Location: Right Arm)   Pulse 76   Temp 98 F (36.7 C) (Oral)   Resp 18   SpO2 95%  Exam:  Physical Exam Vitals reviewed.  Constitutional:      Appearance: Normal appearance.  Pulmonary:     Effort: Pulmonary effort is normal.  Abdominal:     Palpations: Abdomen is soft.  Skin:    General: Skin is warm and dry.     Findings: Erythema present.  Neurological:     Mental Status: She is alert and oriented to person, place, and time.  Psychiatric:        Mood and Affect: Mood normal.        Behavior:  Behavior normal.     Stoma type/location:  LLQ colostomy Stomal assessment/size:  oval, 1 3/8" pink andmoist Peristomal assessment:  intact Treatment options for stomal/peristomal skin: barrier ring and 2 piece pouch Output: thick brown stool Ostomy pouching: 2pc. Pouch with barrier ring Education provided:  pouch change performed.  Patient is anxious for reversal.     Impression/dx  colostomy Discussion  See back as needed Plan  Reversal next week!     Visit time: 30 minutes.   Maple Hudson FNP-BC

## 2022-08-10 ENCOUNTER — Ambulatory Visit (HOSPITAL_BASED_OUTPATIENT_CLINIC_OR_DEPARTMENT_OTHER)
Admission: RE | Admit: 2022-08-10 | Discharge: 2022-08-10 | Disposition: A | Discharge status: Home / Self Care | Payer: Medicare Other | Source: Ambulatory Visit | Attending: *Deleted | Admitting: *Deleted

## 2022-08-10 DIAGNOSIS — K5792 Diverticulitis of intestine, part unspecified, without perforation or abscess without bleeding: Secondary | ICD-10-CM | POA: Diagnosis not present

## 2022-08-10 DIAGNOSIS — R921 Mammographic calcification found on diagnostic imaging of breast: Secondary | ICD-10-CM | POA: Diagnosis not present

## 2022-08-10 DIAGNOSIS — Z8249 Family history of ischemic heart disease and other diseases of the circulatory system: Secondary | ICD-10-CM | POA: Diagnosis not present

## 2022-08-10 DIAGNOSIS — J84112 Idiopathic pulmonary fibrosis: Secondary | ICD-10-CM | POA: Insufficient documentation

## 2022-08-10 DIAGNOSIS — Z8041 Family history of malignant neoplasm of ovary: Secondary | ICD-10-CM | POA: Diagnosis not present

## 2022-08-10 DIAGNOSIS — R64 Cachexia: Secondary | ICD-10-CM | POA: Diagnosis present

## 2022-08-10 DIAGNOSIS — Z823 Family history of stroke: Secondary | ICD-10-CM | POA: Diagnosis not present

## 2022-08-10 DIAGNOSIS — I252 Old myocardial infarction: Secondary | ICD-10-CM | POA: Diagnosis not present

## 2022-08-10 DIAGNOSIS — T8132XA Disruption of internal operation (surgical) wound, not elsewhere classified, initial encounter: Secondary | ICD-10-CM | POA: Diagnosis not present

## 2022-08-10 DIAGNOSIS — Z79890 Hormone replacement therapy (postmenopausal): Secondary | ICD-10-CM | POA: Insufficient documentation

## 2022-08-10 DIAGNOSIS — Z8051 Family history of malignant neoplasm of kidney: Secondary | ICD-10-CM | POA: Diagnosis not present

## 2022-08-10 DIAGNOSIS — D638 Anemia in other chronic diseases classified elsewhere: Secondary | ICD-10-CM | POA: Diagnosis not present

## 2022-08-10 DIAGNOSIS — Z433 Encounter for attention to colostomy: Secondary | ICD-10-CM | POA: Insufficient documentation

## 2022-08-10 DIAGNOSIS — F419 Anxiety disorder, unspecified: Secondary | ICD-10-CM | POA: Diagnosis present

## 2022-08-10 DIAGNOSIS — Z7982 Long term (current) use of aspirin: Secondary | ICD-10-CM | POA: Insufficient documentation

## 2022-08-10 DIAGNOSIS — Z833 Family history of diabetes mellitus: Secondary | ICD-10-CM | POA: Diagnosis not present

## 2022-08-10 DIAGNOSIS — K529 Noninfective gastroenteritis and colitis, unspecified: Secondary | ICD-10-CM | POA: Diagnosis not present

## 2022-08-10 DIAGNOSIS — K660 Peritoneal adhesions (postprocedural) (postinfection): Secondary | ICD-10-CM | POA: Diagnosis not present

## 2022-08-10 DIAGNOSIS — Z933 Colostomy status: Secondary | ICD-10-CM | POA: Diagnosis not present

## 2022-08-10 DIAGNOSIS — N739 Female pelvic inflammatory disease, unspecified: Secondary | ICD-10-CM | POA: Diagnosis not present

## 2022-08-10 DIAGNOSIS — K9400 Colostomy complication, unspecified: Secondary | ICD-10-CM | POA: Diagnosis not present

## 2022-08-10 DIAGNOSIS — Z8049 Family history of malignant neoplasm of other genital organs: Secondary | ICD-10-CM | POA: Diagnosis not present

## 2022-08-10 DIAGNOSIS — Z85828 Personal history of other malignant neoplasm of skin: Secondary | ICD-10-CM | POA: Diagnosis not present

## 2022-08-10 DIAGNOSIS — J4489 Other specified chronic obstructive pulmonary disease: Secondary | ICD-10-CM | POA: Diagnosis present

## 2022-08-10 DIAGNOSIS — E039 Hypothyroidism, unspecified: Secondary | ICD-10-CM | POA: Diagnosis not present

## 2022-08-10 DIAGNOSIS — I2510 Atherosclerotic heart disease of native coronary artery without angina pectoris: Secondary | ICD-10-CM | POA: Diagnosis present

## 2022-08-10 DIAGNOSIS — I5032 Chronic diastolic (congestive) heart failure: Secondary | ICD-10-CM | POA: Diagnosis present

## 2022-08-10 DIAGNOSIS — E876 Hypokalemia: Secondary | ICD-10-CM | POA: Diagnosis present

## 2022-08-10 DIAGNOSIS — K922 Gastrointestinal hemorrhage, unspecified: Secondary | ICD-10-CM | POA: Diagnosis not present

## 2022-08-10 DIAGNOSIS — Z96652 Presence of left artificial knee joint: Secondary | ICD-10-CM | POA: Diagnosis present

## 2022-08-10 DIAGNOSIS — J4520 Mild intermittent asthma, uncomplicated: Secondary | ICD-10-CM | POA: Diagnosis present

## 2022-08-10 DIAGNOSIS — N321 Vesicointestinal fistula: Secondary | ICD-10-CM | POA: Diagnosis present

## 2022-08-10 DIAGNOSIS — I110 Hypertensive heart disease with heart failure: Secondary | ICD-10-CM | POA: Diagnosis present

## 2022-08-10 DIAGNOSIS — Z808 Family history of malignant neoplasm of other organs or systems: Secondary | ICD-10-CM | POA: Diagnosis not present

## 2022-08-10 NOTE — Progress Notes (Signed)
[facility]   Reason for visit:  LLQ colostomy HPI:  Ruptured diverticulum with end colostomy Past Medical History:  Diagnosis Date   Arthritis    Bronchial pneumonia    Colon polyp    Diverticulitis    DVT (deep venous thrombosis) (HCC)    lower extremity   H/O cardiac catheterization 2004   Normal coronary arteries   Heart murmur    History of stress test 05/21/2011   Hx of echocardiogram 01/27/2010   Normal Ef 55% the transmitral spectral doppler flow pattern is normal for age. the left ventricular wall motion is normal   Hypothyroidism    IPF (idiopathic pulmonary fibrosis) (HCC) 01/2018   Myocardial infarction (HCC)    hx of 30 years ago   Pancreatitis    Thyroid disease    TIA (transient ischemic attack)    8 years ago   Family History  Problem Relation Age of Onset   Ovarian cancer Mother    Uterine cancer Mother    Lung cancer Father    HIV Brother        23   Kidney cancer Brother    Lung cancer Brother    Other Brother        Mouth Cancer   Colon cancer Maternal Aunt    Heart disease Maternal Grandmother    Stroke Maternal Grandmother    Hypertension Maternal Grandmother    Diabetes Maternal Grandmother    Arthritis Other    Esophageal cancer Neg Hx    Liver disease Neg Hx    Rectal cancer Neg Hx    Stomach cancer Neg Hx    Allergies  Allergen Reactions   Prednisone Other (See Comments)    Changed personality    Dilaudid [Hydromorphone] Itching and Other (See Comments)    Dilaudid caused marked confusion   Atrovent Hfa [Ipratropium Bromide Hfa] Itching and Other (See Comments)    Pt could not sleep   Cheratussin Ac [Guaifenesin-Codeine] Other (See Comments)    Headaches    Diltiazem Itching and Other (See Comments)    Makes patient sick. Shakes. GI   Hydrocodone Itching   Influenza Vaccines Other (See Comments)    Pt reports heart attack after last flu shot   Morphine Itching   Motrin [Ibuprofen] Itching and Other (See  Comments)    "Gives false reading in blood"   Oxycodone Itching   Codeine Itching, Nausea Only and Other (See Comments)    Pt takes promethazine with codeine at home   Current Outpatient Medications  Medication Sig Dispense Refill Last Dose   benzonatate (TESSALON PERLES) 100 MG capsule Take 1 capsule (100 mg total) by mouth 3 (three) times daily as needed for cough. 20 capsule 0    amoxicillin-clavulanate (AUGMENTIN) 875-125 MG tablet Take 1 tablet by mouth 2 (two) times daily. 20 tablet 0    aspirin EC 81 MG tablet Take 81 mg by mouth daily. Swallow whole.      OFEV 100 MG CAPS Take 100 mg by mouth daily.      sodium chloride (OCEAN) 0.65 % SOLN nasal spray Place 1 spray into both nostrils as needed for congestion.      SYNTHROID 112 MCG tablet TAKE 1 TABLET BY MOUTH DAILY BEFORE BREAKFAST. 90 tablet 0    vitamin C (ASCORBIC ACID) 500 MG tablet Take 500 mg by mouth daily.      No current facility-administered medications for this encounter.   ROS  Review of Systems  Gastrointestinal:        LLQ colostomy  Skin:  Positive for color change.  All other systems reviewed and are negative.  Vital signs:  BP (!) 177/80   Pulse 72   Temp 98.1 F (36.7 C)   Resp 17   SpO2 95%  Exam:  Physical Exam Vitals reviewed.  Constitutional:      Appearance: Normal appearance.  Abdominal:     Palpations: Abdomen is soft.  Skin:    General: Skin is warm and dry.     Findings: Erythema present.  Neurological:     Mental Status: She is alert and oriented to person, place, and time.  Psychiatric:        Mood and Affect: Mood normal.        Behavior: Behavior normal.     Stoma type/location:  LLQ colostomy Stomal assessment/size:  1 1/4" pink and moist Peristomal assessment:  barrier ring and 2 piece pouch Treatment options for stomal/peristomal skin: barrier ring Output: soft brown stool Ostomy pouching: 2pc. 2 1/4" pouch with barrier ring Education provided:  Anticipating reversal.      Impression/dx  colostomy Discussion  Pre surgical testing coming up Plan  See back as needed    Visit time: 35 minutes.   Maple Hudson FNP-BC

## 2022-08-11 DIAGNOSIS — R921 Mammographic calcification found on diagnostic imaging of breast: Secondary | ICD-10-CM | POA: Diagnosis not present

## 2022-08-11 LAB — HM MAMMOGRAPHY

## 2022-08-11 NOTE — Anesthesia Preprocedure Evaluation (Signed)
Anesthesia Evaluation  Patient identified by MRN, date of birth, ID band Patient awake    Reviewed: Allergy & Precautions, NPO status , Patient's Chart, lab work & pertinent test results  Airway Mallampati: I       Dental no notable dental hx.    Pulmonary    Pulmonary exam normal        Cardiovascular Normal cardiovascular exam+ Valvular Problems/Murmurs AI and MR      Neuro/Psych    GI/Hepatic negative GI ROS, Neg liver ROS,,,  Endo/Other  Hypothyroidism    Renal/GU Renal InsufficiencyRenal disease  negative genitourinary   Musculoskeletal   Abdominal Normal abdominal exam  (+)   Peds  Hematology  (+) Blood dyscrasia, anemia   Anesthesia Other Findings  Thurmon Fair, MD 07/09/2022  4:45 PM EDT   The echo is largely good news.   - Heart pumping strength is normal.   - There are some changes in the valve structures that are fairly common for patients in their 70s.  There is mild calcification of the aortic valve which explains the murmur.  There is no actual narrowing of the valve, but there is a mild-moderate leak in the aortic valve.   - There is also mild dilation of the ascending aorta, which we also knew from the CT.     Overall the findings are very similar to the echocardiogram performed in March 2022, except the leak in the aortic valve may be slightly worse. It is stil very unlikely to have any serious consequences.  I do not think it precludes the planned abdominal surgery.  Would probably want to take another look with echo in another year or two to ensure that it is not worsening, but there is no specific medical therapy needed for this.   Echo 07/09/2022 1. Left ventricular ejection fraction, by estimation, is 55 to 60%. The  left ventricle has normal function. The left ventricle has no regional  wall motion abnormalities. There is mild concentric left ventricular  hypertrophy. Left ventricular diastolic   parameters are consistent with Grade I diastolic dysfunction (impaired  relaxation).   2. Right ventricular systolic function is normal. The right ventricular  size is normal. Tricuspid regurgitation signal is inadequate for assessing  PA pressure.   3. The mitral valve is normal in structure. Mild mitral valve  regurgitation. No evidence of mitral stenosis. Moderate mitral annular  calcification.   4. The aortic valve is tricuspid. There is mild calcification of the  aortic valve. Aortic valve regurgitation is mild to moderate. No aortic  stenosis is present.   5. Aortic dilatation noted. There is mild dilatation of the ascending  aorta, measuring 42 mm.   6. IVC         Reproductive/Obstetrics                             Anesthesia Physical Anesthesia Plan  ASA: 2  Anesthesia Plan: General   Post-op Pain Management: Ofirmev IV (intra-op)*   Induction: Intravenous  PONV Risk Score and Plan: 4 or greater and Ondansetron, Dexamethasone and Treatment may vary due to age or medical condition  Airway Management Planned: Oral ETT  Additional Equipment: None  Intra-op Plan:   Post-operative Plan: Extubation in OR  Informed Consent: I have reviewed the patients History and Physical, chart, labs and discussed the procedure including the risks, benefits and alternatives for the proposed anesthesia with the patient or authorized representative  who has indicated his/her understanding and acceptance.     Dental advisory given  Plan Discussed with: CRNA  Anesthesia Plan Comments: (See PAT note 08/03/2022)       Anesthesia Quick Evaluation

## 2022-08-13 ENCOUNTER — Inpatient Hospital Stay (HOSPITAL_COMMUNITY)
Admission: RE | Admit: 2022-08-13 | Discharge: 2022-08-19 | DRG: 330 | Disposition: A | Discharge status: Home / Self Care | Payer: Medicare Other | Attending: Surgery | Admitting: Surgery

## 2022-08-13 ENCOUNTER — Encounter (HOSPITAL_COMMUNITY): Payer: Self-pay | Admitting: Surgery

## 2022-08-13 ENCOUNTER — Inpatient Hospital Stay (HOSPITAL_COMMUNITY): Payer: Medicare Other | Admitting: Physician Assistant

## 2022-08-13 ENCOUNTER — Encounter (HOSPITAL_COMMUNITY)
Admission: RE | Disposition: A | Discharge status: Home / Self Care | Payer: Self-pay | Source: Home / Self Care | Attending: Surgery

## 2022-08-13 ENCOUNTER — Inpatient Hospital Stay (HOSPITAL_COMMUNITY): Payer: Medicare Other | Admitting: Certified Registered"

## 2022-08-13 DIAGNOSIS — I252 Old myocardial infarction: Secondary | ICD-10-CM | POA: Diagnosis not present

## 2022-08-13 DIAGNOSIS — Z01818 Encounter for other preprocedural examination: Secondary | ICD-10-CM

## 2022-08-13 DIAGNOSIS — Z8049 Family history of malignant neoplasm of other genital organs: Secondary | ICD-10-CM | POA: Diagnosis not present

## 2022-08-13 DIAGNOSIS — R64 Cachexia: Secondary | ICD-10-CM | POA: Diagnosis present

## 2022-08-13 DIAGNOSIS — D638 Anemia in other chronic diseases classified elsewhere: Secondary | ICD-10-CM | POA: Diagnosis not present

## 2022-08-13 DIAGNOSIS — E039 Hypothyroidism, unspecified: Secondary | ICD-10-CM | POA: Diagnosis present

## 2022-08-13 DIAGNOSIS — Z433 Encounter for attention to colostomy: Secondary | ICD-10-CM

## 2022-08-13 DIAGNOSIS — K5732 Diverticulitis of large intestine without perforation or abscess without bleeding: Secondary | ICD-10-CM | POA: Diagnosis present

## 2022-08-13 DIAGNOSIS — Z8051 Family history of malignant neoplasm of kidney: Secondary | ICD-10-CM | POA: Diagnosis not present

## 2022-08-13 DIAGNOSIS — Z7982 Long term (current) use of aspirin: Secondary | ICD-10-CM

## 2022-08-13 DIAGNOSIS — Z888 Allergy status to other drugs, medicaments and biological substances status: Secondary | ICD-10-CM

## 2022-08-13 DIAGNOSIS — Z823 Family history of stroke: Secondary | ICD-10-CM

## 2022-08-13 DIAGNOSIS — J4489 Other specified chronic obstructive pulmonary disease: Secondary | ICD-10-CM | POA: Diagnosis present

## 2022-08-13 DIAGNOSIS — Z96652 Presence of left artificial knee joint: Secondary | ICD-10-CM | POA: Diagnosis present

## 2022-08-13 DIAGNOSIS — N736 Female pelvic peritoneal adhesions (postinfective): Secondary | ICD-10-CM | POA: Diagnosis present

## 2022-08-13 DIAGNOSIS — N739 Female pelvic inflammatory disease, unspecified: Secondary | ICD-10-CM | POA: Diagnosis present

## 2022-08-13 DIAGNOSIS — J849 Interstitial pulmonary disease, unspecified: Secondary | ICD-10-CM | POA: Diagnosis present

## 2022-08-13 DIAGNOSIS — Z8249 Family history of ischemic heart disease and other diseases of the circulatory system: Secondary | ICD-10-CM

## 2022-08-13 DIAGNOSIS — E876 Hypokalemia: Secondary | ICD-10-CM | POA: Diagnosis present

## 2022-08-13 DIAGNOSIS — K660 Peritoneal adhesions (postprocedural) (postinfection): Secondary | ICD-10-CM

## 2022-08-13 DIAGNOSIS — Z8041 Family history of malignant neoplasm of ovary: Secondary | ICD-10-CM | POA: Diagnosis not present

## 2022-08-13 DIAGNOSIS — K5792 Diverticulitis of intestine, part unspecified, without perforation or abscess without bleeding: Secondary | ICD-10-CM | POA: Diagnosis not present

## 2022-08-13 DIAGNOSIS — R5383 Other fatigue: Secondary | ICD-10-CM | POA: Diagnosis present

## 2022-08-13 DIAGNOSIS — I110 Hypertensive heart disease with heart failure: Secondary | ICD-10-CM | POA: Diagnosis present

## 2022-08-13 DIAGNOSIS — F419 Anxiety disorder, unspecified: Secondary | ICD-10-CM | POA: Diagnosis present

## 2022-08-13 DIAGNOSIS — J84112 Idiopathic pulmonary fibrosis: Secondary | ICD-10-CM | POA: Diagnosis present

## 2022-08-13 DIAGNOSIS — I5032 Chronic diastolic (congestive) heart failure: Secondary | ICD-10-CM | POA: Diagnosis present

## 2022-08-13 DIAGNOSIS — K922 Gastrointestinal hemorrhage, unspecified: Secondary | ICD-10-CM | POA: Diagnosis not present

## 2022-08-13 DIAGNOSIS — F418 Other specified anxiety disorders: Secondary | ICD-10-CM | POA: Diagnosis present

## 2022-08-13 DIAGNOSIS — Z8673 Personal history of transient ischemic attack (TIA), and cerebral infarction without residual deficits: Secondary | ICD-10-CM

## 2022-08-13 DIAGNOSIS — I2510 Atherosclerotic heart disease of native coronary artery without angina pectoris: Secondary | ICD-10-CM | POA: Diagnosis present

## 2022-08-13 DIAGNOSIS — M5030 Other cervical disc degeneration, unspecified cervical region: Secondary | ICD-10-CM | POA: Diagnosis present

## 2022-08-13 DIAGNOSIS — Z808 Family history of malignant neoplasm of other organs or systems: Secondary | ICD-10-CM

## 2022-08-13 DIAGNOSIS — Z801 Family history of malignant neoplasm of trachea, bronchus and lung: Secondary | ICD-10-CM

## 2022-08-13 DIAGNOSIS — Z933 Colostomy status: Secondary | ICD-10-CM | POA: Diagnosis not present

## 2022-08-13 DIAGNOSIS — Z79890 Hormone replacement therapy (postmenopausal): Secondary | ICD-10-CM | POA: Diagnosis not present

## 2022-08-13 DIAGNOSIS — T8132XA Disruption of internal operation (surgical) wound, not elsewhere classified, initial encounter: Secondary | ICD-10-CM

## 2022-08-13 DIAGNOSIS — N321 Vesicointestinal fistula: Secondary | ICD-10-CM | POA: Diagnosis present

## 2022-08-13 DIAGNOSIS — K529 Noninfective gastroenteritis and colitis, unspecified: Secondary | ICD-10-CM | POA: Diagnosis not present

## 2022-08-13 DIAGNOSIS — Z6821 Body mass index (BMI) 21.0-21.9, adult: Secondary | ICD-10-CM

## 2022-08-13 DIAGNOSIS — J4520 Mild intermittent asthma, uncomplicated: Secondary | ICD-10-CM | POA: Diagnosis present

## 2022-08-13 DIAGNOSIS — Z887 Allergy status to serum and vaccine status: Secondary | ICD-10-CM

## 2022-08-13 DIAGNOSIS — K9409 Other complications of colostomy: Secondary | ICD-10-CM | POA: Insufficient documentation

## 2022-08-13 DIAGNOSIS — Z932 Ileostomy status: Secondary | ICD-10-CM

## 2022-08-13 DIAGNOSIS — Z833 Family history of diabetes mellitus: Secondary | ICD-10-CM

## 2022-08-13 DIAGNOSIS — Z800 Family history of malignant neoplasm of digestive organs: Secondary | ICD-10-CM

## 2022-08-13 DIAGNOSIS — Z85828 Personal history of other malignant neoplasm of skin: Secondary | ICD-10-CM | POA: Diagnosis not present

## 2022-08-13 DIAGNOSIS — K6289 Other specified diseases of anus and rectum: Secondary | ICD-10-CM | POA: Diagnosis present

## 2022-08-13 DIAGNOSIS — Z886 Allergy status to analgesic agent status: Secondary | ICD-10-CM

## 2022-08-13 DIAGNOSIS — Z885 Allergy status to narcotic agent status: Secondary | ICD-10-CM

## 2022-08-13 DIAGNOSIS — Z86718 Personal history of other venous thrombosis and embolism: Secondary | ICD-10-CM

## 2022-08-13 HISTORY — PX: FLEXIBLE SIGMOIDOSCOPY: SHX5431

## 2022-08-13 HISTORY — PX: XI ROBOTIC ASSISTED COLOSTOMY TAKEDOWN: SHX6828

## 2022-08-13 HISTORY — PX: LYSIS OF ADHESION: SHX5961

## 2022-08-13 LAB — TYPE AND SCREEN
ABO/RH(D): A NEG
Antibody Screen: POSITIVE
Unit division: 0
Unit division: 0

## 2022-08-13 LAB — BPAM RBC
Blood Product Expiration Date: 202405292359
Unit Type and Rh: 9500

## 2022-08-13 LAB — GLUCOSE, CAPILLARY: Glucose-Capillary: 163 mg/dL — ABNORMAL HIGH (ref 70–99)

## 2022-08-13 SURGERY — CLOSURE, COLOSTOMY, ROBOT-ASSISTED
Anesthesia: General | Site: Ureter

## 2022-08-13 MED ORDER — ALBUMIN HUMAN 5 % IV SOLN: INTRAVENOUS | Status: DC | PRN

## 2022-08-13 MED ORDER — ACETAMINOPHEN 10 MG/ML IV SOLN: 1000.0000 mg | Freq: Once | INTRAVENOUS | Status: DC | PRN

## 2022-08-13 MED ORDER — ENOXAPARIN SODIUM 40 MG/0.4ML IJ SOSY
40.0000 mg | PREFILLED_SYRINGE | INTRAMUSCULAR | Status: DC
  Administered 2022-08-14 – 2022-08-19 (×6): 40 mg via SUBCUTANEOUS
  Filled 2022-08-13 (×6): qty 0.4

## 2022-08-13 MED ORDER — EPHEDRINE SULFATE (PRESSORS) 50 MG/ML IJ SOLN
INTRAMUSCULAR | Status: DC | PRN
  Administered 2022-08-13: 10 mg via INTRAVENOUS
  Administered 2022-08-13: 20 mg via INTRAVENOUS

## 2022-08-13 MED ORDER — ONDANSETRON HCL 4 MG PO TABS: 4.0000 mg | ORAL_TABLET | Freq: Four times a day (QID) | ORAL | Status: DC | PRN

## 2022-08-13 MED ORDER — SPY AGENT GREEN - (INDOCYANINE FOR INJECTION)
INTRAMUSCULAR | Status: DC | PRN
  Administered 2022-08-13: 5 mL via INTRAVENOUS

## 2022-08-13 MED ORDER — LIDOCAINE HCL (PF) 2 % IJ SOLN
INTRAMUSCULAR | Status: AC
  Filled 2022-08-13: qty 15

## 2022-08-13 MED ORDER — ASPIRIN 81 MG PO TBEC
81.0000 mg | DELAYED_RELEASE_TABLET | Freq: Every day | ORAL | Status: DC
  Administered 2022-08-13 – 2022-08-19 (×7): 81 mg via ORAL
  Filled 2022-08-13 (×7): qty 1

## 2022-08-13 MED ORDER — BUPIVACAINE LIPOSOME 1.3 % IJ SUSP
INTRAMUSCULAR | Status: AC
  Filled 2022-08-13: qty 20

## 2022-08-13 MED ORDER — VITAMIN C 500 MG PO TABS
500.0000 mg | ORAL_TABLET | Freq: Every day | ORAL | Status: DC
  Administered 2022-08-14 – 2022-08-19 (×6): 500 mg via ORAL
  Filled 2022-08-13 (×6): qty 1

## 2022-08-13 MED ORDER — BUPIVACAINE HCL (PF) 0.25 % IJ SOLN
INTRAMUSCULAR | Status: DC | PRN
  Administered 2022-08-13: 40 mL

## 2022-08-13 MED ORDER — ROCURONIUM BROMIDE 10 MG/ML (PF) SYRINGE
PREFILLED_SYRINGE | INTRAVENOUS | Status: AC
  Filled 2022-08-13: qty 30

## 2022-08-13 MED ORDER — EPHEDRINE 5 MG/ML INJ
INTRAVENOUS | Status: AC
  Filled 2022-08-13: qty 5

## 2022-08-13 MED ORDER — GABAPENTIN 300 MG PO CAPS
300.0000 mg | ORAL_CAPSULE | ORAL | Status: AC
  Administered 2022-08-13: 300 mg via ORAL
  Filled 2022-08-13: qty 1

## 2022-08-13 MED ORDER — ONDANSETRON HCL 4 MG/2ML IJ SOLN
4.0000 mg | Freq: Four times a day (QID) | INTRAMUSCULAR | Status: DC | PRN
  Administered 2022-08-13 – 2022-08-18 (×3): 4 mg via INTRAVENOUS
  Filled 2022-08-13 (×4): qty 2

## 2022-08-13 MED ORDER — LIDOCAINE HCL (PF) 2 % IJ SOLN
INTRAMUSCULAR | Status: AC
  Filled 2022-08-13: qty 10

## 2022-08-13 MED ORDER — NEOMYCIN SULFATE 500 MG PO TABS: 1000.0000 mg | ORAL_TABLET | ORAL | Status: DC

## 2022-08-13 MED ORDER — ENSURE PRE-SURGERY PO LIQD: 296.0000 mL | Freq: Once | ORAL | Status: DC

## 2022-08-13 MED ORDER — ONDANSETRON HCL 4 MG/2ML IJ SOLN: 4.0000 mg | Freq: Once | INTRAMUSCULAR | Status: DC | PRN

## 2022-08-13 MED ORDER — CHLORHEXIDINE GLUCONATE 0.12 % MT SOLN
15.0000 mL | Freq: Once | OROMUCOSAL | Status: AC
  Administered 2022-08-13: 15 mL via OROMUCOSAL

## 2022-08-13 MED ORDER — MAGIC MOUTHWASH: 15.0000 mL | Freq: Four times a day (QID) | ORAL | Status: DC | PRN

## 2022-08-13 MED ORDER — FENTANYL CITRATE PF 50 MCG/ML IJ SOSY: 25.0000 ug | PREFILLED_SYRINGE | INTRAMUSCULAR | Status: DC | PRN

## 2022-08-13 MED ORDER — LIDOCAINE 2% (20 MG/ML) 5 ML SYRINGE
INTRAMUSCULAR | Status: DC | PRN
  Administered 2022-08-13: 1.5 mg/kg/h via INTRAVENOUS

## 2022-08-13 MED ORDER — PROCHLORPERAZINE MALEATE 10 MG PO TABS: 10.0000 mg | ORAL_TABLET | Freq: Four times a day (QID) | ORAL | Status: DC | PRN

## 2022-08-13 MED ORDER — MELATONIN 3 MG PO TABS
3.0000 mg | ORAL_TABLET | Freq: Every evening | ORAL | Status: DC | PRN
  Administered 2022-08-15 – 2022-08-16 (×2): 3 mg via ORAL
  Filled 2022-08-13 (×3): qty 1

## 2022-08-13 MED ORDER — LIDOCAINE HCL (PF) 2 % IJ SOLN
INTRAMUSCULAR | Status: AC
  Filled 2022-08-13: qty 5

## 2022-08-13 MED ORDER — IBUPROFEN 100 MG/5ML PO SUSP: 200.0000 mg | Freq: Four times a day (QID) | ORAL | Status: DC | PRN

## 2022-08-13 MED ORDER — HYDRALAZINE HCL 20 MG/ML IJ SOLN: 10.0000 mg | INTRAMUSCULAR | Status: DC | PRN

## 2022-08-13 MED ORDER — METOPROLOL TARTRATE 5 MG/5ML IV SOLN
5.0000 mg | Freq: Four times a day (QID) | INTRAVENOUS | Status: DC | PRN
  Administered 2022-08-18: 5 mg via INTRAVENOUS
  Filled 2022-08-13: qty 5

## 2022-08-13 MED ORDER — FENTANYL CITRATE (PF) 100 MCG/2ML IJ SOLN
INTRAMUSCULAR | Status: DC | PRN
  Administered 2022-08-13: 50 ug via INTRAVENOUS
  Administered 2022-08-13: 100 ug via INTRAVENOUS
  Administered 2022-08-13: 50 ug via INTRAVENOUS

## 2022-08-13 MED ORDER — MENTHOL 3 MG MT LOZG: 1.0000 | LOZENGE | OROMUCOSAL | Status: DC | PRN

## 2022-08-13 MED ORDER — LACTATED RINGERS IV SOLN: INTRAVENOUS | Status: DC

## 2022-08-13 MED ORDER — ALVIMOPAN 12 MG PO CAPS
12.0000 mg | ORAL_CAPSULE | Freq: Two times a day (BID) | ORAL | Status: DC
  Administered 2022-08-14 – 2022-08-15 (×4): 12 mg via ORAL
  Filled 2022-08-13 (×4): qty 1

## 2022-08-13 MED ORDER — CHLORHEXIDINE GLUCONATE CLOTH 2 % EX PADS: 6.0000 | MEDICATED_PAD | Freq: Once | CUTANEOUS | Status: DC

## 2022-08-13 MED ORDER — BENZONATATE 100 MG PO CAPS: 100.0000 mg | ORAL_CAPSULE | Freq: Three times a day (TID) | ORAL | Status: DC | PRN

## 2022-08-13 MED ORDER — ACETAMINOPHEN 500 MG PO TABS
1000.0000 mg | ORAL_TABLET | Freq: Four times a day (QID) | ORAL | Status: DC
  Administered 2022-08-13 – 2022-08-19 (×22): 1000 mg via ORAL
  Filled 2022-08-13 (×23): qty 2

## 2022-08-13 MED ORDER — LACTATED RINGERS IV BOLUS: 1000.0000 mL | Freq: Three times a day (TID) | INTRAVENOUS | Status: AC | PRN

## 2022-08-13 MED ORDER — STERILE WATER FOR INJECTION IJ SOLN
INTRAMUSCULAR | Status: AC
  Filled 2022-08-13: qty 10

## 2022-08-13 MED ORDER — IBUPROFEN 200 MG PO TABS
200.0000 mg | ORAL_TABLET | Freq: Four times a day (QID) | ORAL | Status: DC | PRN
Start: 2022-08-13 — End: 2022-08-13

## 2022-08-13 MED ORDER — FENTANYL CITRATE (PF) 250 MCG/5ML IJ SOLN
INTRAMUSCULAR | Status: AC
  Filled 2022-08-13: qty 5

## 2022-08-13 MED ORDER — BUPIVACAINE LIPOSOME 1.3 % IJ SUSP: 20.0000 mL | Freq: Once | INTRAMUSCULAR | Status: DC

## 2022-08-13 MED ORDER — PHENYLEPHRINE 80 MCG/ML (10ML) SYRINGE FOR IV PUSH (FOR BLOOD PRESSURE SUPPORT)
PREFILLED_SYRINGE | INTRAVENOUS | Status: AC
  Filled 2022-08-13: qty 10

## 2022-08-13 MED ORDER — OXYCODONE HCL 5 MG PO TABS: 5.0000 mg | ORAL_TABLET | Freq: Once | ORAL | Status: DC | PRN

## 2022-08-13 MED ORDER — OXYCODONE HCL 5 MG/5ML PO SOLN: 5.0000 mg | Freq: Once | ORAL | Status: DC | PRN

## 2022-08-13 MED ORDER — INDOCYANINE GREEN 25 MG IV SOLR
INTRAVENOUS | Status: AC
  Filled 2022-08-13: qty 10

## 2022-08-13 MED ORDER — ALUM & MAG HYDROXIDE-SIMETH 200-200-20 MG/5ML PO SUSP
30.0000 mL | Freq: Four times a day (QID) | ORAL | Status: DC | PRN
  Administered 2022-08-16: 30 mL via ORAL

## 2022-08-13 MED ORDER — ENSURE PRE-SURGERY PO LIQD: 592.0000 mL | Freq: Once | ORAL | Status: DC

## 2022-08-13 MED ORDER — ENOXAPARIN SODIUM 40 MG/0.4ML IJ SOSY
40.0000 mg | PREFILLED_SYRINGE | Freq: Once | INTRAMUSCULAR | Status: AC
  Administered 2022-08-13: 40 mg via SUBCUTANEOUS
  Filled 2022-08-13: qty 0.4

## 2022-08-13 MED ORDER — SIMETHICONE 40 MG/0.6ML PO SUSP
80.0000 mg | Freq: Four times a day (QID) | ORAL | Status: DC | PRN
  Administered 2022-08-17: 80 mg via ORAL
  Filled 2022-08-13: qty 1.2

## 2022-08-13 MED ORDER — ALUM & MAG HYDROXIDE-SIMETH 200-200-20 MG/5ML PO SUSP
30.0000 mL | Freq: Four times a day (QID) | ORAL | Status: DC | PRN
  Filled 2022-08-13: qty 30

## 2022-08-13 MED ORDER — ENSURE SURGERY PO LIQD: 237.0000 mL | Freq: Two times a day (BID) | ORAL | Status: DC

## 2022-08-13 MED ORDER — PHENYLEPHRINE HCL (PRESSORS) 10 MG/ML IV SOLN
INTRAVENOUS | Status: DC | PRN
  Administered 2022-08-13 (×4): 80 ug via INTRAVENOUS

## 2022-08-13 MED ORDER — PROCHLORPERAZINE EDISYLATE 10 MG/2ML IJ SOLN
5.0000 mg | Freq: Four times a day (QID) | INTRAMUSCULAR | Status: DC | PRN
  Administered 2022-08-15 – 2022-08-16 (×2): 10 mg via INTRAVENOUS
  Filled 2022-08-13 (×2): qty 2

## 2022-08-13 MED ORDER — BUPIVACAINE LIPOSOME 1.3 % IJ SUSP
INTRAMUSCULAR | Status: DC | PRN
  Administered 2022-08-13: 20 mL

## 2022-08-13 MED ORDER — KETAMINE HCL 10 MG/ML IJ SOLN
INTRAMUSCULAR | Status: DC | PRN
  Administered 2022-08-13: 20 mg via INTRAVENOUS

## 2022-08-13 MED ORDER — FENTANYL CITRATE PF 50 MCG/ML IJ SOSY
12.5000 ug | PREFILLED_SYRINGE | INTRAMUSCULAR | Status: DC | PRN
  Administered 2022-08-13 (×2): 12.5 ug via INTRAVENOUS
  Administered 2022-08-15 – 2022-08-18 (×11): 25 ug via INTRAVENOUS
  Filled 2022-08-13 (×14): qty 1

## 2022-08-13 MED ORDER — SIMETHICONE 80 MG PO CHEW
40.0000 mg | CHEWABLE_TABLET | Freq: Four times a day (QID) | ORAL | Status: DC | PRN
  Filled 2022-08-13: qty 1

## 2022-08-13 MED ORDER — CALCIUM POLYCARBOPHIL 625 MG PO TABS
625.0000 mg | ORAL_TABLET | Freq: Two times a day (BID) | ORAL | Status: DC
  Administered 2022-08-13 – 2022-08-19 (×12): 625 mg via ORAL
  Filled 2022-08-13 (×12): qty 1

## 2022-08-13 MED ORDER — DEXAMETHASONE SODIUM PHOSPHATE 10 MG/ML IJ SOLN
INTRAMUSCULAR | Status: DC | PRN
  Administered 2022-08-13: 5 mg via INTRAVENOUS

## 2022-08-13 MED ORDER — LEVOTHYROXINE SODIUM 112 MCG PO TABS
112.0000 ug | ORAL_TABLET | Freq: Every day | ORAL | Status: DC
  Administered 2022-08-14 – 2022-08-19 (×6): 112 ug via ORAL
  Filled 2022-08-13 (×6): qty 1

## 2022-08-13 MED ORDER — 0.9 % SODIUM CHLORIDE (POUR BTL) OPTIME
TOPICAL | Status: DC | PRN
  Administered 2022-08-13: 2000 mL

## 2022-08-13 MED ORDER — POLYETHYLENE GLYCOL 3350 17 GM/SCOOP PO POWD: 1.0000 | Freq: Once | ORAL | Status: DC

## 2022-08-13 MED ORDER — SODIUM CHLORIDE 0.9 % IV SOLN
2.0000 g | Freq: Two times a day (BID) | INTRAVENOUS | Status: AC
  Administered 2022-08-13: 2 g via INTRAVENOUS
  Filled 2022-08-13: qty 2

## 2022-08-13 MED ORDER — LIP MEDEX EX OINT
TOPICAL_OINTMENT | Freq: Two times a day (BID) | CUTANEOUS | Status: DC
  Administered 2022-08-14: 1 via TOPICAL
  Administered 2022-08-17 – 2022-08-18 (×2): 75 via TOPICAL
  Filled 2022-08-13 (×3): qty 7

## 2022-08-13 MED ORDER — METRONIDAZOLE 500 MG PO TABS: 1000.0000 mg | ORAL_TABLET | ORAL | Status: DC

## 2022-08-13 MED ORDER — PHENOL 1.4 % MT LIQD: 2.0000 | OROMUCOSAL | Status: DC | PRN

## 2022-08-13 MED ORDER — PROPOFOL 10 MG/ML IV BOLUS
INTRAVENOUS | Status: DC | PRN
  Administered 2022-08-13: 150 mg via INTRAVENOUS

## 2022-08-13 MED ORDER — ALVIMOPAN 12 MG PO CAPS
12.0000 mg | ORAL_CAPSULE | ORAL | Status: AC
  Administered 2022-08-13: 12 mg via ORAL
  Filled 2022-08-13: qty 1

## 2022-08-13 MED ORDER — SODIUM CHLORIDE 0.9 % IV SOLN
2.0000 g | INTRAVENOUS | Status: AC
  Administered 2022-08-13: 2 g via INTRAVENOUS
  Filled 2022-08-13: qty 2

## 2022-08-13 MED ORDER — LACTATED RINGERS IV SOLN: INTRAVENOUS | Status: DC | PRN

## 2022-08-13 MED ORDER — ROCURONIUM BROMIDE 100 MG/10ML IV SOLN
INTRAVENOUS | Status: DC | PRN
  Administered 2022-08-13: 20 mg via INTRAVENOUS
  Administered 2022-08-13: 50 mg via INTRAVENOUS
  Administered 2022-08-13: 30 mg via INTRAVENOUS
  Administered 2022-08-13: 10 mg via INTRAVENOUS

## 2022-08-13 MED ORDER — ORAL CARE MOUTH RINSE: 15.0000 mL | Freq: Once | OROMUCOSAL | Status: AC

## 2022-08-13 MED ORDER — DIPHENHYDRAMINE HCL 50 MG/ML IJ SOLN: 12.5000 mg | Freq: Four times a day (QID) | INTRAMUSCULAR | Status: DC | PRN

## 2022-08-13 MED ORDER — TRAMADOL HCL 50 MG PO TABS
50.0000 mg | ORAL_TABLET | Freq: Four times a day (QID) | ORAL | Status: DC | PRN
  Administered 2022-08-13 – 2022-08-16 (×8): 100 mg via ORAL
  Administered 2022-08-16 – 2022-08-17 (×2): 50 mg via ORAL
  Administered 2022-08-17: 100 mg via ORAL
  Filled 2022-08-13: qty 1
  Filled 2022-08-13 (×5): qty 2
  Filled 2022-08-13: qty 1
  Filled 2022-08-13 (×4): qty 2

## 2022-08-13 MED ORDER — LIDOCAINE HCL (CARDIAC) PF 100 MG/5ML IV SOSY
PREFILLED_SYRINGE | INTRAVENOUS | Status: DC | PRN
  Administered 2022-08-13: 80 mg via INTRAVENOUS

## 2022-08-13 MED ORDER — ONDANSETRON HCL 4 MG/2ML IJ SOLN
INTRAMUSCULAR | Status: DC | PRN
  Administered 2022-08-13: 4 mg via INTRAVENOUS

## 2022-08-13 MED ORDER — STERILE WATER FOR IRRIGATION IR SOLN
Status: DC | PRN
  Administered 2022-08-13: 3000 mL

## 2022-08-13 MED ORDER — ACETAMINOPHEN 500 MG PO TABS
1000.0000 mg | ORAL_TABLET | ORAL | Status: AC
  Administered 2022-08-13: 1000 mg via ORAL
  Filled 2022-08-13: qty 2

## 2022-08-13 MED ORDER — LACTATED RINGERS IV SOLN: INTRAVENOUS | Status: AC

## 2022-08-13 MED ORDER — DIPHENHYDRAMINE HCL 12.5 MG/5ML PO ELIX
12.5000 mg | ORAL_SOLUTION | Freq: Four times a day (QID) | ORAL | Status: DC | PRN
  Administered 2022-08-15: 12.5 mg via ORAL
  Filled 2022-08-13: qty 5

## 2022-08-13 MED ORDER — BUPIVACAINE HCL 0.25 % IJ SOLN
INTRAMUSCULAR | Status: AC
  Filled 2022-08-13: qty 1

## 2022-08-13 MED ORDER — BISACODYL 5 MG PO TBEC: 20.0000 mg | DELAYED_RELEASE_TABLET | Freq: Once | ORAL | Status: DC

## 2022-08-13 MED ORDER — KETAMINE HCL 50 MG/5ML IJ SOSY
PREFILLED_SYRINGE | INTRAMUSCULAR | Status: AC
  Filled 2022-08-13: qty 5

## 2022-08-13 SURGICAL SUPPLY — 127 items
ADAPTER GOLDBERG URETERAL (ADAPTER) IMPLANT
ADPR CATH 15X14FR FL DRN BG (ADAPTER)
APL PRP STRL LF DISP 70% ISPRP (MISCELLANEOUS)
APPLIER CLIP 5 13 M/L LIGAMAX5 (MISCELLANEOUS)
APPLIER CLIP ROT 10 11.4 M/L (STAPLE)
APR CLP MED LRG 11.4X10 (STAPLE)
APR CLP MED LRG 5 ANG JAW (MISCELLANEOUS)
BAG COUNTER SPONGE SURGICOUNT (BAG) ×3 IMPLANT
BAG SPNG CNTER NS LX DISP (BAG) ×2
BAG URO CATCHER STRL LF (MISCELLANEOUS) ×3 IMPLANT
BLADE EXTENDED COATED 6.5IN (ELECTRODE) IMPLANT
CANNULA REDUCER 12-8 DVNC XI (CANNULA) IMPLANT
CATH URETL OPEN 5X70 (CATHETERS) IMPLANT
CELLS DAT CNTRL 66122 CELL SVR (MISCELLANEOUS) IMPLANT
CHLORAPREP W/TINT 26 (MISCELLANEOUS) IMPLANT
CLIP APPLIE 5 13 M/L LIGAMAX5 (MISCELLANEOUS) IMPLANT
CLIP APPLIE ROT 10 11.4 M/L (STAPLE) IMPLANT
CLOTH BEACON ORANGE TIMEOUT ST (SAFETY) ×3 IMPLANT
COVER SURGICAL LIGHT HANDLE (MISCELLANEOUS) ×6 IMPLANT
COVER TIP SHEARS 8 DVNC (MISCELLANEOUS) ×3 IMPLANT
DEVICE TROCAR PUNCTURE CLOSURE (ENDOMECHANICALS) IMPLANT
DRAIN CHANNEL 19F RND (DRAIN) IMPLANT
DRAPE ARM DVNC X/XI (DISPOSABLE) ×12 IMPLANT
DRAPE COLUMN DVNC XI (DISPOSABLE) ×3 IMPLANT
DRAPE SURG IRRIG POUCH 19X23 (DRAPES) ×3 IMPLANT
DRIVER NDL LRG 8 DVNC XI (INSTRUMENTS) ×3 IMPLANT
DRIVER NDLE LRG 8 DVNC XI (INSTRUMENTS) ×2 IMPLANT
DRSG OPSITE POSTOP 4X10 (GAUZE/BANDAGES/DRESSINGS) IMPLANT
DRSG OPSITE POSTOP 4X6 (GAUZE/BANDAGES/DRESSINGS) IMPLANT
DRSG OPSITE POSTOP 4X8 (GAUZE/BANDAGES/DRESSINGS) IMPLANT
DRSG TEGADERM 2-3/8X2-3/4 SM (GAUZE/BANDAGES/DRESSINGS) ×15 IMPLANT
DRSG TEGADERM 4X4.75 (GAUZE/BANDAGES/DRESSINGS) IMPLANT
ELECT PENCIL ROCKER SW 15FT (MISCELLANEOUS) ×3 IMPLANT
ELECT REM PT RETURN 15FT ADLT (MISCELLANEOUS) ×3 IMPLANT
ENDOLOOP SUT PDS II  0 18 (SUTURE)
ENDOLOOP SUT PDS II 0 18 (SUTURE) IMPLANT
EVACUATOR SILICONE 100CC (DRAIN) IMPLANT
GAUZE SPONGE 2X2 8PLY STRL LF (GAUZE/BANDAGES/DRESSINGS) ×3 IMPLANT
GLOVE ECLIPSE 8.0 STRL XLNG CF (GLOVE) ×9 IMPLANT
GLOVE INDICATOR 8.0 STRL GRN (GLOVE) ×9 IMPLANT
GLOVE SURG LX STRL 7.5 STRW (GLOVE) ×3 IMPLANT
GOWN SRG XL LVL 4 BRTHBL STRL (GOWNS) ×3 IMPLANT
GOWN STRL NON-REIN XL LVL4 (GOWNS) ×2
GOWN STRL REUS W/ TWL XL LVL3 (GOWN DISPOSABLE) ×15 IMPLANT
GOWN STRL REUS W/TWL XL LVL3 (GOWN DISPOSABLE) ×10
GRASPER SUT TROCAR 14GX15 (MISCELLANEOUS) IMPLANT
GRASPER TIP-UP FEN DVNC XI (INSTRUMENTS) ×3 IMPLANT
GUIDEWIRE ANG ZIPWIRE 038X150 (WIRE) IMPLANT
GUIDEWIRE STR DUAL SENSOR (WIRE) IMPLANT
HOLDER FOLEY CATH W/STRAP (MISCELLANEOUS) ×3 IMPLANT
IRRIG SUCT STRYKERFLOW 2 WTIP (MISCELLANEOUS) ×2
IRRIGATION SUCT STRKRFLW 2 WTP (MISCELLANEOUS) ×3 IMPLANT
KIT PROCEDURE DVNC SI (MISCELLANEOUS) ×3 IMPLANT
KIT SIGMOIDOSCOPE (SET/KITS/TRAYS/PACK) IMPLANT
KIT TURNOVER KIT A (KITS) IMPLANT
LIGASURE IMPACT 36 18CM CVD LR (INSTRUMENTS) IMPLANT
MANIFOLD NEPTUNE II (INSTRUMENTS) ×3 IMPLANT
NDL INSUFFLATION 14GA 120MM (NEEDLE) ×3 IMPLANT
NEEDLE INSUFFLATION 14GA 120MM (NEEDLE) ×2 IMPLANT
PACK CARDIOVASCULAR III (CUSTOM PROCEDURE TRAY) ×3 IMPLANT
PACK COLON (CUSTOM PROCEDURE TRAY) ×3 IMPLANT
PACK CYSTO (CUSTOM PROCEDURE TRAY) ×3 IMPLANT
PAD POSITIONING PINK XL (MISCELLANEOUS) ×3 IMPLANT
PROTECTOR NERVE ULNAR (MISCELLANEOUS) ×6 IMPLANT
RELOAD AUTO 90-3.5 TA90 BLE (ENDOMECHANICALS) ×2 IMPLANT
RELOAD PROXIMATE 75MM BLUE (ENDOMECHANICALS) ×4 IMPLANT
RELOAD STAPLE 45 3.5 BLU DVNC (STAPLE) IMPLANT
RELOAD STAPLE 45 4.3 GRN DVNC (STAPLE) IMPLANT
RELOAD STAPLE 60 3.5 BLU DVNC (STAPLE) IMPLANT
RELOAD STAPLE 60 4.3 GRN DVNC (STAPLE) IMPLANT
RELOAD STAPLE 75 3.8 BLU REG (ENDOMECHANICALS) IMPLANT
RELOAD STAPLE 90 BLU REG (ENDOMECHANICALS) IMPLANT
RELOAD STAPLER 3.5X45 BLU DVNC (STAPLE) IMPLANT
RELOAD STAPLER 3.5X60 BLU DVNC (STAPLE) IMPLANT
RELOAD STAPLER 4.3X45 GRN DVNC (STAPLE) IMPLANT
RELOAD STAPLER 4.3X60 GRN DVNC (STAPLE) IMPLANT
RETRACTOR WND ALEXIS 18 MED (MISCELLANEOUS) IMPLANT
RTRCTR WOUND ALEXIS 18CM MED (MISCELLANEOUS)
SCISSORS LAP 5X35 DISP (ENDOMECHANICALS) ×3 IMPLANT
SCISSORS MNPLR CVD DVNC XI (INSTRUMENTS) ×3 IMPLANT
SEAL UNIV 5-12 XI (MISCELLANEOUS) ×12 IMPLANT
SEALER VESSEL EXT DVNC XI (MISCELLANEOUS) ×3 IMPLANT
SOL ELECTROSURG ANTI STICK (MISCELLANEOUS) ×2
SOLUTION ELECTROSURG ANTI STCK (MISCELLANEOUS) ×3 IMPLANT
SPIKE FLUID TRANSFER (MISCELLANEOUS) ×3 IMPLANT
STAPLER 45 SUREFORM DVNC (STAPLE) IMPLANT
STAPLER 60 SUREFORM DVNC (STAPLE) IMPLANT
STAPLER 90 3.5 STAND SLIM (STAPLE) ×2
STAPLER 90 3.5 STD SLIM (STAPLE) IMPLANT
STAPLER ECHELON POWER CIR 29 (STAPLE) IMPLANT
STAPLER ECHELON POWER CIR 31 (STAPLE) IMPLANT
STAPLER PROXIMATE 75MM BLUE (STAPLE) IMPLANT
STAPLER RELOAD 3.5X45 BLU DVNC (STAPLE)
STAPLER RELOAD 3.5X60 BLU DVNC (STAPLE)
STAPLER RELOAD 4.3X45 GRN DVNC (STAPLE)
STAPLER RELOAD 4.3X60 GRN DVNC (STAPLE)
STOPCOCK 4 WAY LG BORE MALE ST (IV SETS) ×6 IMPLANT
SURGILUBE 2OZ TUBE FLIPTOP (MISCELLANEOUS) IMPLANT
SUT MNCRL AB 4-0 PS2 18 (SUTURE) ×3 IMPLANT
SUT PDS AB 1 CT1 27 (SUTURE) ×6 IMPLANT
SUT PROLENE 0 CT 2 (SUTURE) IMPLANT
SUT PROLENE 2 0 KS (SUTURE) IMPLANT
SUT PROLENE 2 0 SH DA (SUTURE) IMPLANT
SUT SILK 2 0 (SUTURE)
SUT SILK 2 0 SH CR/8 (SUTURE) IMPLANT
SUT SILK 2-0 18XBRD TIE 12 (SUTURE) IMPLANT
SUT SILK 3 0 (SUTURE)
SUT SILK 3 0 SH CR/8 (SUTURE) ×3 IMPLANT
SUT SILK 3-0 18XBRD TIE 12 (SUTURE) IMPLANT
SUT V-LOC BARB 180 2/0GR6 GS22 (SUTURE)
SUT VIC AB 2-0 SH 18 (SUTURE) IMPLANT
SUT VIC AB 3-0 SH 18 (SUTURE) IMPLANT
SUT VIC AB 3-0 SH 27 (SUTURE)
SUT VIC AB 3-0 SH 27XBRD (SUTURE) IMPLANT
SUT VICRYL 0 UR6 27IN ABS (SUTURE) ×3 IMPLANT
SUTURE V-LC BRB 180 2/0GR6GS22 (SUTURE) IMPLANT
SYR 20ML ECCENTRIC (SYRINGE) ×3 IMPLANT
SYS LAPSCP GELPORT 120MM (MISCELLANEOUS)
SYS WOUND ALEXIS 18CM MED (MISCELLANEOUS) ×2
SYSTEM LAPSCP GELPORT 120MM (MISCELLANEOUS) IMPLANT
SYSTEM WOUND ALEXIS 18CM MED (MISCELLANEOUS) ×3 IMPLANT
TOWEL OR NON WOVEN STRL DISP B (DISPOSABLE) ×3 IMPLANT
TRAY FOLEY MTR SLVR 16FR STAT (SET/KITS/TRAYS/PACK) ×3 IMPLANT
TROCAR ADV FIXATION 5X100MM (TROCAR) ×3 IMPLANT
TUBING CONNECTING 10 (TUBING) ×9 IMPLANT
TUBING INSUFFLATION 10FT LAP (TUBING) ×3 IMPLANT
TUBING UROLOGY SET (TUBING) IMPLANT

## 2022-08-13 NOTE — Op Note (Addendum)
08/13/2022  4:51 PM  PATIENT:  Alexandria [name]  77 y.o. female  Patient Care Team: Zola Button, Grayling Congress, DO as PCP - General (Family Medicine) Thomasene Ripple, DO as PCP - Cardiology (Cardiology) Freddy Finner, MD (Inactive) as Consulting Physician (Obstetrics and Gynecology) Thomasene Ripple, DO as Consulting Physician (Cardiology) Imogene Burn, MD as Consulting Physician (Gastroenterology) Karie Soda, MD as Consulting Physician (General Surgery)  PRE-OPERATIVE DIAGNOSIS:   -COLOSTOMY FOR COLON RESECTION. DESIRE FOR OSTOMY TAKEDOWN -HISTORY OF DIVERTICULITIS WITH STRICTURE & ABSCESS S/P HARTMANN COLECTOMY/COLOSTOMY  POST-OPERATIVE DIAGNOSIS:   -COLOSTOMY FOR COLON RESECTION. DESIRE FOR OSTOMY TAKEDOWN -CHRONIC RECTAL STUMP DEHISCENCE -PELVIC ABSCESS -HISTORY OF DIVERTICULITIS WITH STRICTURE & ABSCESS S/P HARTMANN COLECTOMY/COLOSTOMY  PROCEDURE:   -ROBOTIC COLOSTOMY TAKEDOWN WITH LOW COLORECTAL ANASTOMOSIS  -DIVERTING LOOP ILEOSTOMY -MOBILIZATION OF SPLENIC FLEXURE OF COLON -SMALL BOWEL RESECTION X 2 -LYSIS OF ADHESIONS X 2.5 HOURS (60% CASE) -FLEXIBLE SIGMOIDOSCOPY -INTRAOPERATIVE ASSESSMENT OF TISSUE VASCULAR PERFUSION USING ICG (indocyanine green) IMMUNOFLUORESCENCE -TRANSVERSUS ABDOMINIS PLANE (TAP) BLOCK - BILATERAL  SURGEON:  Ardeth Sportsman, MD  ASSISTANT:  Angelena Form, MD  An experienced assistant was required given the standard of surgical care given the complexity of the case.  This assistant was needed for exposure, dissection, suction, tissue approximation, retraction, perception, etc  ANESTHESIA:  General endotracheal intubation anesthesia (GETA) and Regional TRANSVERSUS ABDOMINIS PLANE (TAP) nerve block -BILATERAL for perioperative & postoperative pain control at the level of the transverse abdominis & preperitoneal spaces along the flank at the anterior axillary line, from subcostal ridge to iliac crest under laparoscopic guidance provided with liposomal  bupivacaine (Experel) 20mL mixed with 40 mL of bupivicaine 0.25%  Estimated Blood Loss (EBL):   Total I/O In: 2750 [I.V.:2400; IV Piggyback:350] Out: 250 [Urine:150; Blood:100].   (See anesthesia record)  Delay start of Pharmacological VTE agent (>24hrs) due to concerns of significant anemia, surgical blood loss, or risk of bleeding?:  no  DRAINS: (None) and 19 Fr Blake drain the tip resting in the pelvis  SPECIMEN:   -END COLOSTOMY -ILEUM X 2 RESECTIONS -DISTAL ANASTOMOTIC RING (RECTUM)  DISPOSITION OF SPECIMEN:  Pathology  COUNTS:  Sponge, needle, & instrument counts CORRECT  PLAN OF CARE: Admit to inpatient   PATIENT DISPOSITION:  PACU - hemodynamically stable.  INDICATION: Pleasant patient status post colectomy with end ostomy.  The patient has recovered from that surgery and has understandably requested ostomy takedown.  Medically stabilized and felt reasonable to proceed.   I discussed the procedure with the patient:  The anatomy & physiology of the digestive tract was discussed.  The pathophysiology was discussed.  Possibility of remaining with an ostomy permanently was discussed.  I offered ostomy takedown.  Minimally invasive & open techniques were discussed.   Risks such as bleeding, infection, abscess, leak, reoperation, possible re-ostomy, injury to other organs, hernia, heart attack, death, and other risks were discussed.   I noted a good likelihood this will help address the problem.  Goals of post-operative recovery were discussed as well.  We will work to minimize complications.  Questions were answered.  The patient expresses understanding & wishes to proceed with surgery.  OR FINDINGS: Very dense adhesions of especially small bowel to the pelvis with ileal serosa acting as a patch over a chronic rectal stump dehiscence.  Small abscess nearby.  Ileal small bowel resection x 2 needed.  Rectal stump foreshortened and retracted.  Splenic flexure mobilization and high  ligation of IMA/IMV pedicle needed for the colostomy to  reach down in the very low pelvis.  ICG firefly done to confirm good perfusion.  It is a 29 EEA (mid descending to distal rectal) anastomosis.  At rest 5-6 cm from the anal verge.  Because of a low rectal anastomosis with abscess nearby and poor tissues, temporary diverting loop ileostomy done.  CASE DATA:  Type of patient?: Elective [facility] Private Case  Status of Case? Elective Scheduled  Infection Present At Time Of Surgery (PATOS)?  ABSCESS  DESCRIPTION:   Informed consent was confirmed.  The patient underwent general anaesthesia without difficulty.  The patient was positioned appropriately.  VTE prevention in place.  Patient underwent cystoscopy with ICG firefly infiltration of both ureters and bladder by Dr. Marlou Porch with [facility]..  Please see his operative report.  The patient's abdomen was clipped, prepped, & draped in a sterile fashion.  Surgical timeout confirmed our plan.  Peritoneal entry with a laparoscopic port was obtained using Varess spring needle entry technique in the left upper abdomen as the patient was positioned in reverse Trendelenburg.  I induced carbon dioxide insufflation.  No change in end tidal CO2 measurements.  Full symmetrical abdominal distention.  Initial port was carefully placed.  Camera inspection revealed no injury.  Extra ports were carefully placed under direct laparoscopic visualization.  Xi robot carefully docked & instruments placed.  We then continued with minimally invasive exploration.  I worked to freed adhesions to the anterior abdominal wall parietal peritoneum.  Freed omental adhesions off the colostomy and visceral peritoneum.  Make sure to free all small bowel interloop adhesions and adhesions to the left retroperitoneum and colostomy mesentery.  We inspected the small intestine to ensure there is no injury or other surprises.  Hemostasis was good.    We focused on dissection down in the  pelvis.  Work to free adhesions and identify the rectal stump.  Patient had very dense posterior adhesions down to the very low pelvis.  And have to transect through some bowel wall to get it off and it was quite apparent that ileum had acted as a serosal patch on a chronic rectal stump perforation.  Small chronic abscess in the region freed off and aspirated as well.  Mobilized and free interloop adhesions.  Did digital and EEA sizer examination to confirm that there was chronic perforation at the apex of the rectal stump.  Mobilized the vaginal cuff of the anterior rectal wall.  Came underneath the mesorectum of the short rectal stump to help mobilize it.  Freed off few tensions on the rectal wall as well.  That allowed much greater mobility of the rectal stump.  Because there was some viability and something to hook up to, we decided it was reasonable to attempt colostomy takedown.  I used a 2-0 absorbable serrated V-Loc suture in a running Connell figure-of-eight suture to help close the stump at the apex and imbricated.  It was apparent that the colon would not reach down to the very deep pelvis.  I mobilized the descending colon and the lateral splenic flexure in a lateral medial fashion.  Elevated the mesentery.  It was some foreshortened at the inferior mesenteric artery and vein.  Elevated and freed off the retroperitoneum and did high ligation of both pedicles to good result.  Continue mobilization till the more dominant left middle colic pedicle.  Annitta Needs off mesocolon attachments to the retroperitoneum and Gerota's fascia up towards the splenic flexure.  Because I was worried about tension and side to do a  splenic flexure mobilization.  Freed the greater omentum off the mid transverse colon and got into the lesser sac.  Freed greater omentum more distally along the distal transverse colon and splenic flexure.  Elevated the distal transverse colon off the retroperitoneum and transected off just inferior  to the inferior pancreatic ridge.  Without I got much better mobilization.  To access vascular perfusion of tissues, we asked anesthesia use intravenous  indocyanine green (ICG) with IV flush.  I switched to the NIR fluorescence (Firefly mode) imaging window on the daVinci robot platform.  We were able to see good light green visualization of blood vessels with good vascular perfusion of tissues, confirming good tissue perfusion of tissues (mid descending colon and rectal stump) planned for anastomosis.  We made an incision around the ostomy.  I got into the subcutaneous tissues.  I used careful focused right angle dissection and sharp dissection.  Some focused cautery dissection as well.  That helped to free adhesions to the subcutaneous tisses & fascia.  I was able to enter into the peritoneum focally.  I did a gentle finger sweep.  Gradually came around circumferentially and freed the bowel from remaining adhesions to the abdominal wall.  We were able eviscerate the ostomy to do inspection and assured viability and hemostasis.  I chose as distal the location that was viable for the proximal end of the anastomosis.  I clamped the colon at the point of resection using a reusable pursestringer device.  Passed a 2-0 Keith needle. I transected at the descending/sigmoid junction with a scalpel. I got healthy bleeding mucosa.  We sent the rectosigmoid colon specimen off to go to pathology.  We sized the colon orifice.   I chose a 29mm EEA anvil stapler system.  I reinforced the prolene pursestring with interrupted silk "belt loop" sutures.  I placed the anvil to the open end of the proximal remaining colon and closed around it using the pursestring.   Returned the viscera back into the abdomen and did inspection of the abdomen.    We eviscerated the small bowel and confirmed 2 regions of enterotomies where there have been extremely dense adhesions and serosal patches.  It and transecting these areas of ileum using  a 75 GIA to do a side-to-side stapled anastomosis and then a TX 92 close and transected off the came common staple defect in a Barcelona type side-to-side anastomosis.  Transected through the mesentery using a bipolar system and closed the mesenteric defect transversely over the TX staple line as a mesenteric pexy over the TX staple line to good result.  Ran the small bowel more proximally and distally and confirmed no other injury.  He returned the bowel and back into the abdomen and did laparoscopic reinspection.  Ran the small bowel from the ileocecal valve to ligament of Treitz and confirmed no other injury or other abnormality.  Anastomosis healthy and viable.  We did copious irrigation with crystalloid solution.  Hemostasis was good.  The distal end of the colon at the handle easily reached down to the rectal stump, therefore, further colonic mobilization was not needed.      Dr Cliffton Asters scrubbed down and did gentle anal dilation and advanced the EEA stapler up the rectal stump. The spike was brought out at the provimal end of the rectal stump at the epicenter of the pursestring suture under direct visualization.  I  attached the anvil of the proximal colon the spike of the stapler. Anvil was tightened down  and held clamped for 60 seconds.  Orientation was confirmed such that there is no twisting of the colon nor small bowel underneath the mesenteric defect. No concerning tension.  The EEA stapler was fired and held clamped for 30 seconds. The stapler was released & removed. Blue stitch is in the proximal ring.  Care was taken to ensure no other structures were incorporated within this either.  We noted 2 excellent anastomotic rings.  The colon proximal to the anastomosis was then gently occluded. The pelvis was filled with sterile irrigation.  Dr Cliffton Asters did flexible sigmoidoscopy and was able to come across the colorectal anastomosis transanally.  There was a negative air leak test.  Proximal and distal  mucosal looked viable.  There was some mild diversion proctitis of the rectal stump but there is no evidence of leak injury or other abnormality.  We inspected the EEA anastomosis and noted that it was healthy viable intact at 5-6 cm from the anal verge consistent with the proximal rectum.  There was no tension of mesentery or bowel at the anastomosis.   Tissues looked viable.  Ureters & bowel uninjured.  The anastomosis looked healthy.   Given the very low anastomosis with an abscess nearby with chronic rectal perforation and poor tissues, I felt it would be wisest to do a temporary diverting loop ileostomy and Dr. Cliffton Asters agreed.  Found the ileocecal valve and shows a region about 15 cm more proximally.  This could actually reach over to the prior left paramedian and old colostomy site.  Drain placed around the very low anastomosis as protection and monitoring.  Greater omentum positioned down into the pelvis to help protect the anastomosis.    I removed CO2 gas out through the ports.  Ports and will protector and instruments removed.  We changed gloves.   Sterile unused instruments were used from this point out per colon SSI prevention protocol.  We brought up the loop ileostomy confirmed the distal end was inferior at the old left paramedian colostomy site.  We tighten the fascia down using #1 PDS transversely x 2 laterally and one medially.  Not had a more snug closure.  I did 2-0 Vicryl interrupted sutures from the fascia to the seromuscular of proximal distal ends, 2 in each side to help hold the ileostomy well.  I closed the 5mm port sites using Monocryl stitch and sterile dressing.  I matured the loop ileostomy in the classic Brooke fashion using 2-0 Vicryl interrupted sutures with a higher more proximal dominant cephalad ileostomy and a more distal flat distal end acting as a mucous fistula.  There is no twisting or torsion.  Viable.  Ileostomy appliance placed.  Patient is being extubated go to  recovery room. I discussed postop care with the patient in detail the office & in the holding area. Instructions are written.  I discussed operative findings, updated the patient's status, discussed probable steps to recovery, and gave postoperative recommendations to the patient's daughter, Fathima Ord .  Recommendations were made.  Questions were answered.  She expressed understanding & appreciation.  I'm about to locate family and discuss it with them as well.   Ardeth Sportsman, M.D., F.A.C.S. Gastrointestinal and Minimally Invasive Surgery [facility] [address], [address] [place], [place] [address] [phone] Main / Paging

## 2022-08-13 NOTE — OR Nursing (Signed)
Daughter, Melissa caled at 58 with update. Lars Masson RN

## 2022-08-13 NOTE — Anesthesia Procedure Notes (Signed)
Procedure Name: Intubation Date/Time: 08/13/2022 12:56 PM  Performed by: Deri Fuelling, CRNAPre-anesthesia Checklist: Patient identified, Emergency Drugs available, Suction available and Patient being monitored Patient Re-evaluated:Patient Re-evaluated prior to induction Oxygen Delivery Method: Circle system utilized Preoxygenation: Pre-oxygenation with 100% oxygen Induction Type: IV induction Ventilation: Mask ventilation without difficulty Laryngoscope Size: Mac and 3 Tube type: Oral Tube size: 7.0 mm Number of attempts: 1 Airway Equipment and Method: Stylet and Oral airway Placement Confirmation: ETT inserted through vocal cords under direct vision, positive ETCO2 and breath sounds checked- equal and bilateral Secured at: 21 cm Tube secured with: Tape Dental Injury: Teeth and Oropharynx as per pre-operative assessment

## 2022-08-13 NOTE — H&P (Signed)
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  Patient returns for second postoperative visit. She is feeling better overall. She is here today with her husband. She had a drain study done which showed no more abscess cavity but a fistulous connection to the rectum. Because she had minimal rectal drainage and no output from her drain decision was made to remove the drain. Since the drain has been out she has noted some rectal drainage but it is tapered off. It is more thick and waxy now. She end up having to come in the office next week to have the drain stitch removed by [place]. She has been wearing a diaper just in case but notes that she is having much less drainage this week.  Her energy level is improving. She felt lightheaded but after holding her blood pressure meds she is feeling better. She followed up with her primary care physician. Her appetites improved. She has been taking fiber Gummies as well as a fiber protein powder and movement emptying her colostomy about 4 times a day. Appetite is definitely improved. She has been walking better. However she did trip and fall at home which concerned her so she has been a little more cautious. She gets some mild soreness and burning around her colostomy but taking an aspirin seems to control it.  PRIOR NOTE Patient returns her office for the first time. She was in the hospital for almost 3 weeks with total colon  obstruction from diverticulitis requiring Hartmann resection drainage abscess with postop abscess and prolonged ileus. Eventually went home 04/21/2022. Her daughter came down from [place] and has been helping take care of her. The patient has had some colostomy issues but went to the colostomy clinic last Friday for troubleshooting. She is emptying it about 3-4 times a day. She still has her transgluteal drain for her recurrent pelvic abscess. She is wondering when that can come out. She recalls that there is a radiology appointment later this week. Her appetite is improving. She lost a fair amount of weight but it is stabilizing. Daughter's been giving her some protein powders. Patient for them to do snacks. She is walking around a little better. Off pain medications. No fevers or chills. Urinating okay. She had postop rectal bleeding. That is gone way down to maybe once a week. She still wears a pad just in case but notes it has not been having any blood for most of the week. She is walking better. She comes today with a rolling walker just for balance purposes but does not need it at home. She is trying to hope that she can get back to performing. Due to have recital next October.    Labs, Imaging and Diagnostic Testing:  Located in 'Care Everywhere' section of Epic EMR chart  PRIOR [facility] CLINIC NOTES:  Located in 'Care Everywhere' section of Epic EMR chart  SURGERY NOTES:  Located in 'Care Everywhere' section of Epic EMR chart  03/31/2022  POST-OPERATIVE DIAGNOSIS: Rectosigmoid stricture with perforation & abscess, probable diverticulitis  PROCEDURE: ROBOTIC LOWER ANTERIOR RECTOSIGMOID  RESECTION WITH COLOSTOMY DRAINAGE OF PELVIC ABSCESS TRANSVERSUS ABDOMINIS PLANE (TAP) BLOCK - BILATERAL RIGID PROCTOSCOPY  SURGEON: Ardeth Sportsman, MD   OR FINDINGS:  Patient had very twisted rectosigmoid colon spiraled like a corkscrew filling the pelvis. Quite kinked and folded upon itself. At the  epicenter of the folded rectosigmoid mesentery was an obvious abscess with contained perforation and some stool. Rectum itself did not seem particularly inflamed. No definite endoluminal mass up to the untwisted tumor but thickened and abnormal.  No obvious metastatic disease on visceral parietal peritoneum or liver.  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 Rectal stump to 11 cm with suture.  CASE DATA:  Type of patient?: LDOW CASE (Surgical Hospitalist [facility] Inpatient)  Status of Case? URGENT Add On  Infection Present At Time Of Surgery (PATOS)? ABSCESS   PATHOLOGY:  Located in 'Care Everywhere' section of Epic EMR chart  Diverticulosis with acute diverticulitis and pericolic abscess consistent with perforation.  Physical Examination:  There is no height or weight on file to calculate BMI.  Constitutional: Mildly pale but less cachectic. More animated and talkative. Hygeine adequate. Eyes: Normal extraocular movements. Sclera nonicteric Neuro: No major focal sensory defects. No major motor deficits. Psych: No severe agitation. No severe anxiety. Judgment & insight Adequate, Oriented x4, HENT: Normocephalic, Mucus membranes moist. No thrush. Neck: Supple, No tracheal deviation. Chest: Good respiratory excursion. No audible wheezing CV: No major extremity edema Ext: No obvious deformity or contracture. Edema: not present. No cyanosis Skin: Warm and dry Musculoskeletal: Mobility: no assist device moving easily without restrictions  Abdomen: Incisions Clean & dry with normal healing ridge Nontender. Soft. Nondistended. Colostomy left abdomen pink with normal rosebud. Play-Doh consistency stool in bag.  Gen: Inguinal hernia: Not present. Inguinal lymph nodes: . Wearing a diaper without any urine or stool staining  Rectal: deferred    Assessment and Plan:  Alexandria [name] is a 77 y.o. [name] recovering s/p urgent Hartmann  resection for chronic colon obstruction due to perforated diverticulitis with partially contained abscess and prolonged hospital recovery..  There are no diagnoses linked to this encounter.   She is definitely getting better.  Continue focusing on nutrition and exercise. I think it be wise to get blood work before the next visit to make sure that her nutrition numbers are better. She does look a little wiped out cachectic but is feeling better overall. Get out and walk more. Daughter has been helping take care of her with protein powders.  Colostomy takedown. Robotically assisted. She has gained some weight back and nutrition is more normal. Seems like the small abscess cavity fistula to the rectal stump is tapering off and healing on its own. We will shoot for April/May. Think will be wise to get firefly done by urology at the start of the case to help delete the anatomy since it was rather severe.   The anatomy & physiology of the digestive tract was discussed. The pathophysiology was discussed. Possibility of remaining with an ostomy permanently was discussed. I offered ostomy takedown. Laparoscopic & open techniques were discussed.  Risks such as bleeding, infection, abscess, leak, reoperation, possible re-ostomy, injury to other organs, need for repair of tissues / organs, need for further treatment, hernia, heart attack, death, and other risks were discussed. I noted a good likelihood this will help address the problem. Goals of post-operative recovery were discussed as well. We will work to minimize complications. Questions were answered. The patient expresses  understanding & wishes to proceed with surgery.  She had had a colonoscopy in July 2023 that was underwhelming proximally. Pathology benign. Sigmoidoscopy done later. That raise concern of malignancy but that resected area is benign consistent with diverticulitis. Her current gastrologist, Dr. Leonides Schanz, does not feel repeat colonoscopy is  needed.  Surgery delayed due to PNA in March 2024 Ready for surgery   Ardeth Sportsman, MD, FACS, MASCRS Esophageal, Gastrointestinal & Colorectal Surgery Robotic and Minimally Invasive Surgery  [facility] [address] [place], [place] [address] [phone] Fax [phone] Main  CONTACT INFORMATION:  Weekday (9AM-5PM): Call [facility] main office at [phone]  Weeknight (5PM-9AM) or Weekend/Holiday: Check [email or web address] ([email or web address] " [email or web address]") for General Surgery [facility] coverage  (Please, do not use SecureChat as it is not reliable communication to reach operating surgeons for immediate patient care given surgeries/outpatient duties/clinic/cross-coverage/off post-call which would lead to a delay in care.  Epic staff messaging available for outptient concerns, but may not be answered for 48 hours or more).    08/13/2022

## 2022-08-13 NOTE — Discharge Instructions (Addendum)
SURGERY: POST OP INSTRUCTIONS (Surgery for small bowel obstruction, colon resection, etc)   ######################################################################  EAT Gradually transition to a high fiber diet with a fiber supplement over the next few days after discharge  WALK Walk an hour a day.  Control your pain to do that.    CONTROL PAIN Control pain so that you can walk, sleep, tolerate sneezing/coughing, go up/down stairs.  HAVE A BOWEL MOVEMENT DAILY Keep your bowels regular to avoid problems.  OK to try a laxative to override constipation.  OK to use an antidairrheal to slow down diarrhea.  Call if not better after 2 tries  CALL IF YOU HAVE PROBLEMS/CONCERNS Call if you are still struggling despite following these instructions. Call if you have concerns not answered by these instructions  ######################################################################   DIET Follow a light diet the first few days at home.  Start with a bland diet such as soups, liquids, starchy foods, low fat foods, etc.  If you feel full, bloated, or constipated, stay on a ful liquid or pureed/blenderized diet for a few days until you feel better and no longer constipated. Be sure to drink plenty of fluids every day to avoid getting dehydrated (feeling dizzy, not urinating, etc.). Gradually add a fiber supplement to your diet over the next week.  Gradually get back to a regular solid diet.  Avoid fast food or heavy meals the first week as you are more likely to get nauseated. It is expected for your digestive tract to need a few months to get back to normal.  It is common for your bowel movements and stools to be irregular.  You will have occasional bloating and cramping that should eventually fade away.  Until you are eating solid food normally, off all pain medications, and back to regular activities; your bowels will not be normal. Focus on eating a low-fat, high fiber diet the rest of your life  (See Getting to Good Bowel Health, below).  CARE of your INCISIONS  It is good for closed incisions and even open wounds to be washed every day.  Shower every day.  Short baths are fine.  Wash the incisions and wounds clean with soap & water.    You may leave closed incisions open to air if it is dry.   You may cover the incision with clean gauze & replace it after your daily shower for comfort.  ACTIVITIES as tolerated Start light daily activities --- self-care, walking, climbing stairs-- beginning the day after surgery.  Gradually increase activities as tolerated.  Control your pain to be active.  Stop when you are tired.  Ideally, walk several times a day, eventually an hour a day.   Most people are back to most day-to-day activities in a few weeks.  It takes 4-8 weeks to get back to unrestricted, intense activity. If you can walk 30 minutes without difficulty, it is safe to try more intense activity such as jogging, treadmill, bicycling, low-impact aerobics, swimming, etc. Save the most intensive and strenuous activity for last (Usually 4-8 weeks after surgery) such as sit-ups, heavy lifting, contact sports, etc.  Refrain from any intense heavy lifting or straining until you are off narcotics for pain control.  You will have off days, but things should improve week-by-week. DO NOT PUSH THROUGH PAIN.  Let pain be your guide: If it hurts to do something, don't do it.  Pain is your body warning you to avoid that activity for another week until the pain goes down.  You may drive when you are no longer taking narcotic prescription pain medication, you can comfortably wear a seatbelt, and you can safely make sudden turns/stops to protect yourself without hesitating due to pain. You may have sexual intercourse when it is comfortable. If it hurts to do something, stop.   MEDICATIONS Take your usually prescribed home medications unless otherwise directed.   Blood thinners:  You can restart any strong  blood thinners after the second postoperative day.  It is OK to continue aspirin before & after surgery..    Some blood in BMs the first 1-2 weeks is common but should taper down & be small volume.  If you are passing many large clots, call your surgeon    PAIN CONTROL Pain after surgery or related to activity is often due to strain/injury to muscle, tendon, nerves and/or incisions.  This pain is usually short-term and will improve in a few months.  To help speed the process of healing and to get back to regular activity more quickly, DO THE FOLLOWING THINGS TOGETHER: Increase activity gradually.  DO NOT PUSH THROUGH PAIN Use Ice and/or Heat Try Gentle Massage and/or Stretching Take over the counter pain medication Take Narcotic prescription pain medication for more severe pain  Good pain control = faster recovery.  It is better to take more medicine to be more active than to stay in bed all day to avoid medications.  Increase activity gradually Avoid heavy lifting at first, then increase to lifting as tolerated over the next 6 weeks. Do not "push through" the pain.  Listen to your body and avoid positions and maneuvers than reproduce the pain.  Wait a few days before trying something more intense Walking an hour a day is encouraged to help your body recover faster and more safely.  Start slowly and stop when getting sore.  If you can walk 30 minutes without stopping or pain, you can try more intense activity (running, jogging, aerobics, cycling, swimming, treadmill, sex, sports, weightlifting, etc.) Remember: If it hurts to do it, then don't do it! Use Ice and/or Heat You will have swelling and bruising around the incisions.  This will take several weeks to resolve. Ice packs or heating pads (6-8 times a day, 30-60 minutes at a time) will help sooth soreness & bruising. Some people prefer to use ice alone, heat alone, or alternate between ice & heat.  Experiment and see what works best for  you.  Consider trying ice for the first few days to help decrease swelling and bruising; then, switch to heat to help relax sore spots and speed recovery. Shower every day.  Short baths are fine.  It feels good!  Keep the incisions and wounds clean with soap & water.   Try Gentle Massage and/or Stretching Massage at the area of pain many times a day Stop if you feel pain - do not overdo it Take over the counter pain medication This helps the muscle and nerve tissues become less irritable and calm down faster Choose ONE of the following over-the-counter anti-inflammatory medications: Acetaminophen 500mg  tabs (Tylenol) 1-2 pills with every meal and just before bedtime (avoid if you have liver problems or if you have acetaminophen in you narcotic prescription) Naproxen 220mg  tabs (ex. Aleve, Naprosyn) 1-2 pills twice a day (avoid if you have kidney, stomach, IBD, or bleeding problems) Ibuprofen 200mg  tabs (ex. Advil, Motrin) 3-4 pills with every meal and just before bedtime (avoid if you have kidney, stomach, IBD, or bleeding problems)  Take with food/snack several times a day as directed for at least 2 weeks to help keep pain / soreness down & more manageable. Take Narcotic prescription pain medication for more severe pain A prescription for strong pain control is often given to you upon discharge (for example: oxycodone/Percocet, hydrocodone/Norco/Vicodin, or tramadol/Ultram) Take your pain medication as prescribed. Be mindful that most narcotic prescriptions contain Tylenol (acetaminophen) as well - avoid taking too much Tylenol. If you are having problems/concerns with the prescription medicine (does not control pain, nausea, vomiting, rash, itching, etc.), please call us [phone] to see if we need to switch you to a different pain medicine that will work better for you and/or control your side effects better. If you need a refill on your pain medication, you must call the office before 4 pm  and on weekdays only.  By federal law, prescriptions for narcotics cannot be called into a pharmacy.  They must be filled out on paper & picked up from our office by the patient or authorized caretaker.  Prescriptions cannot be filled after 4 pm nor on weekends.    WHEN TO CALL us [phone] Severe uncontrolled or worsening pain  Fever over 101 F (38.5 C) Concerns with the incision: Worsening pain, redness, rash/hives, swelling, bleeding, or drainage Reactions / problems with new medications (itching, rash, hives, nausea, etc.) Nausea and/or vomiting Difficulty urinating Difficulty breathing Worsening fatigue, dizziness, lightheadedness, blurred vision Other concerns If you are not getting better after two weeks or are noticing you are getting worse, contact our office [phone] for further advice.  We may need to adjust your medications, re-evaluate you in the office, send you to the emergency room, or see what other things we can do to help. The clinic staff is available to answer your questions during regular business hours (8:30am-5pm).  Please don't hesitate to call and ask to speak to one of our nurses for clinical concerns.    A surgeon from [facility] is always on call at the hospitals 24 hours/day If you have a medical emergency, go to the nearest emergency room or call 911.  FOLLOW UP in our office One the day of your discharge from the hospital (or the next business weekday), please call [facility] to set up or confirm an appointment to see your surgeon in the office for a follow-up appointment.  Usually it is 2-3 weeks after your surgery.   If you have skin staples at your incision(s), let the office know so we can set up a time in the office for the nurse to remove them (usually around 10 days after surgery). Make sure that you call for appointments the day of discharge (or the next business weekday) from the hospital to ensure a convenient  appointment time. IF YOU HAVE DISABILITY OR FAMILY LEAVE FORMS, BRING THEM TO THE OFFICE FOR PROCESSING.  DO NOT GIVE THEM TO YOUR DOCTOR.  [facility] [address], [address], [place], [place]  [address] ? [phone] - Main [phone] - Toll Free,  [phone] - Fax [email or web address]    GETTING TO GOOD BOWEL HEALTH. It is expected for your digestive tract to need a few months to get back to normal.  It is common for your bowel movements and stools to be irregular.  You will have occasional bloating and cramping that should eventually fade away.  Until you are eating solid food normally, off all pain medications, and back to regular  activities; your bowels will not be normal.   Avoiding constipation The goal: ONE SOFT BOWEL MOVEMENT A DAY!    Drink plenty of fluids.  Choose water first. TAKE A FIBER SUPPLEMENT EVERY DAY THE REST OF YOUR LIFE During your first week back home, gradually add back a fiber supplement every day Experiment which form you can tolerate.   There are many forms such as powders, tablets, wafers, gummies, etc Psyllium bran (Metamucil), methylcellulose (Citrucel), Miralax or Glycolax, Benefiber, Flax Seed.  Adjust the dose week-by-week (1/2 dose/day to 6 doses a day) until you are moving your bowels 1-2 times a day.  Cut back the dose or try a different fiber product if it is giving you problems such as diarrhea or bloating.  Controlling diarrhea Try drinking liquids and eating bland foods for a few days to avoid stressing your intestines further. Avoid dairy products (especially milk & ice cream) for a short time.  The intestines often can lose the ability to digest lactose when stressed. Avoid foods that cause gassiness or bloating.  Typical foods include beans and other legumes, cabbage, broccoli, and dairy foods.  Avoid greasy, spicy, fast foods.  Every person has some sensitivity to other foods, so listen to your body  and avoid those foods that trigger problems for you. Probiotics (such as active yogurt, Align, etc) may help repopulate the intestines and colon with normal bacteria and calm down a sensitive digestive tract Adding a fiber supplement gradually can help thicken stools by absorbing excess fluid and retrain the intestines to act more normally.  Slowly increase the dose over a few weeks.  Too much fiber too soon can backfire and cause cramping & bloating. It is okay to try and slow down diarrhea with a few doses of antidiarrheal medicines.   Bismuth subsalicylate (ex. Kayopectate, Pepto Bismol) for a few doses can help control diarrhea.  Avoid if pregnant.   Loperamide (Imodium) can slow down diarrhea.  Start with one tablet (2mg ) first.  Avoid if you are having fevers or severe pain.   ILEOSTOMY PATIENTS WILL HAVE CHRONIC DIARRHEA since their colon is not in use.    Drink plenty of liquids.  You will need to drink even more glasses of water/liquid a day to avoid getting dehydrated. Record output from your ileostomy.  Expect to empty the bag every 3-4 hours at first.  Most people with a permanent ileostomy empty their bag 4-6 times at the least.   Use antidiarrheal medicine (especially Imodium) several times a day to avoid getting dehydrated.  Start with a dose at bedtime & breakfast.  Adjust up or down as needed.  Increase antidiarrheal medications as directed to avoid emptying the bag more than 8 times a day (every 3 hours). Work with your wound ostomy nurse to learn care for your ostomy.  See ostomy care instructions. TROUBLESHOOTING IRREGULAR BOWELS 1) Start with a soft & bland diet. No spicy, greasy, or fried foods.  2) Avoid gluten/wheat or dairy products from diet to see if symptoms improve. 3) Miralax 17gm or flax seed mixed in 8oz. water or juice-daily. May use 2-4 times a day as needed. 4) Gas-X, Phazyme, etc. as needed for gas & bloating.  5) Prilosec (omeprazole) over-the-counter as  needed 6)  Consider probiotics (Align, Activa, etc) to help calm the bowels down  Call your doctor if you are getting worse or not getting better.  Sometimes further testing (cultures, endoscopy, X-ray studies, CT scans, bloodwork, etc.) may be needed  to help diagnose and treat the cause of the diarrhea. [facility] [address], [place], [place]  [address] [phone] - Main.    [phone]  - Toll Free.   [phone] - Fax [email or web address]     #######################################################  Ostomy Support Information  You've heard that people get along just fine with only one of their eyes, or one of their lungs, or one of their kidneys. But you also know that you have only one intestine and only one bladder, and that leaves you feeling awfully empty, both physically and emotionally: You think no other people go around without part of their intestine with the ends of their intestines sticking out through their abdominal walls.   YOU ARE NOT ALONE.  There are nearly three quarters of a million people in the [place] who have an ostomy; people who have had surgery to remove all or part of their colons or bladders.   There is even a national association, the [place] [employer] [place] with over 350 local affiliated support groups that are organized by volunteers who provide peer support and counseling. Cheral Marker has a toll free telephone num-ber, [phone] and an educational, interactive website, www.ostomy.org   An ostomy is an opening in the belly (abdominal wall) made by surgery. Ostomates are people who have had this procedure. The opening (stoma) allows the kidney or bowel to grdischarge waste. An external pouch covers the stoma to collect waste. Pouches are are a simple bag and are odor free. Different companies have disposable or reusable pouches to fit one's lifestyle. An ostomy can either be temporary or permanent.    THERE ARE THREE MAIN TYPES OF OSTOMIES Colostomy. A colostomy is a surgically created opening in the large intestine (colon). Ileostomy. An ileostomy is a surgically created opening in the small intestine. Urostomy. A urostomy is a surgically created opening to divert urine away from the bladder.  OSTOMY Care  The following guidelines will make care of your colostomy easier. Keep this information close by for quick reference.  Helpful DIET hints Eat a well-balanced diet including vegetables and fresh fruits. Eat on a regular schedule.  Drink at least 6 to 8 glasses of fluids daily. Eat slowly in a relaxed atmosphere. Chew your food thoroughly. Avoid chewing gum, smoking, and drinking from a straw. This will help decrease the amount of air you swallow, which may help reduce gas. Eating yogurt or drinking buttermilk may help reduce gas.  To control gas at night, do not eat after 8 p.m. This will give your bowel time to quiet down before you go to bed.  If gas is a problem, you can purchase Beano. Sprinkle Beano on the first bite of food before eating to reduce gas. It has no flavor and should not change the taste of your food. You can buy Beano over the counter at your local drugstore.  Foods like fish, onions, garlic, broccoli, asparagus, and cabbage produce odor. Although your pouch is odor-proof, if you eat these foods you may notice a stronger odor when emptying your pouch. If this is a concern, you may want to limit these foods in your diet.  If you have an ileostomy, you will have chronic diarrhea & need to drink more liquids to avoid getting dehydrated.  Consider antidiarrheal medicine like imodium (loperamide) or Lomotil to help slow down bowel movements / diarrhea into your ileostomy bag.  GETTING TO GOOD BOWEL HEALTH WITH AN ILEOSTOMY  With the colon bypassed & not in use, you will have small bowel diarrhea.   It is important to thicken & slow your bowel movements down.    The goal: 4-6 small BOWEL MOVEMENTS A DAY It is important to drink plenty of liquids to avoid getting dehydrated  CONTROLLING ILEOSTOMY DIARRHEA  TAKE A FIBER SUPPLEMENT (FiberCon or Benefiner soluble fiber) twice a day - to thicken stools by absorbing excess fluid and retrain the intestines to act more normally.  Slowly increase the dose over a few weeks.  Too much fiber too soon can backfire and cause cramping & bloating.  TAKE AN IRON SUPPLEMENT twice a day to naturally constipate your bowels.  Usually ferrous sulfate 325mg  twice a day)  TAKE ANTI-DIARRHEAL MEDICINES: Loperamide (Imodium) can slow down diarrhea.  Start with two tablets (= 4mg ) first and then try one tablet every 6 hours.  Can go up to 2 pills four times day (8 pills of 2mg  max) Avoid if you are having fevers or severe pain.  If you are not better or start feeling worse, stop all medicines and call your doctor for advice LoMotil (Diphenoxylate / Atropine) is another medicine that can constipate & slow down bowel moevements Pepto Bismol (bismuth) can gently thicken bowels as well  If diarrhea is worse,: drink plenty of liquids and try simpler foods for a few days to avoid stressing your intestines further. Avoid dairy products (especially milk & ice cream) for a short time.  The intestines often can lose the ability to digest lactose when stressed. Avoid foods that cause gassiness or bloating.  Typical foods include beans and other legumes, cabbage, broccoli, and dairy foods.  Every person has some sensitivity to other foods, so listen to our body and avoid those foods that trigger problems for you.Call your doctor if you are getting worse or not better.  Sometimes further testing (cultures, endoscopy, X-ray studies, bloodwork, etc) may be needed to help diagnose and treat the cause of the diarrhea. Take extra anti-diarrheal medicines (maximum is 8 pills of 2mg  loperamide a day)   Tips for POUCHING an OSTOMY   Changing  Your Pouch The best time to change your pouch is in the morning, before eating or drinking anything. Your stoma can function at any time, but it will function more after eating or drinking.   Applying the pouching system  Place all your equipment close at hand before removing your pouch.  Wash your hands.  Stand or sit in front of a mirror. Use the position that works best for you. Remember that you must keep the skin around the stoma wrinkle-free for a good seal.  Gently remove the used pouch (1-piece system) or the pouch and old wafer (2-piece system). Empty the pouch into the toilet. Save the closure clip to use again.  Wash the stoma itself and the skin around the stoma. Your stoma may bleed a little when being washed. This is normal. Rinse and pat dry. You may use a wash cloth or soft paper towels (like Bounty), mild soap (like Dial, Safeguard, or [place]), and water. Avoid soaps that contain perfumes or lotions.  For a new pouch (1-piece system) or a new wafer (2-piece system), measure your stoma using the stoma guide in each box of supplies.  Trace the shape of your stoma onto the back of the new pouch or the back of the new wafer. Cut out the opening. Remove the paper backing and set it aside.  Optional: Apply  a skin barrier powder to surrounding skin if it is irritated (bare or weeping), and dust off the excess. Optional: Apply a skin-prep wipe (such as Skin Prep or All-Kare) to the skin around the stoma, and let it dry. Do not apply this solution if the skin is irritated (red, tender, or broken) or if you have shaved around the stoma. Optional: Apply a skin barrier paste (such as Stomahesive, Coloplast, or Premium) around the opening cut in the back of the pouch or wafer. Allow it to dry for 30 to 60 seconds.  Hold the pouch (1-piece system) or wafer (2-piece system) with the sticky side toward your body. Make sure the skin around the stoma is wrinkle-free. Center the opening on the  stoma, then press firmly to your abdomen (Fig. 4). Look in the mirror to check if you are placing the pouch, or wafer, in the right position. For a 2-piece system, snap the pouch onto the wafer. Make sure it snaps into place securely.  Place your hand over the stoma and the pouch or wafer for about 30 seconds. The heat from your hand can help the pouch or wafer stick to your skin.  Add deodorant (such as Super Banish or Nullo) to your pouch. Other options include food extracts such as vanilla oil and peppermint extract. Add about 10 drops of the deodorant to the pouch. Then apply the closure clamp. Note: Do not use toxic  chemicals or commercial cleaning agents in your pouch. These substances may harm the stoma.  Optional: For extra seal, apply tape to all 4 sides around the pouch or wafer, as if you were framing a picture. You may use any brand of medical adhesive tape. Change your pouch every 5 to 7 days. Change it immediately if a leak occurs.  Wash your hands afterwards.  If you are wearing a 2-piece system, you may use 2 new pouches per week and alternate them. Rinse the pouch with mild soap and warm water and hang it to dry for the next day. Apply the fresh pouch. Alternate the 2 pouches like this for a week. After a week, change the wafer and begin with 2 new pouches. Place the old pouches in a plastic bag, and put them in the trash.   LIVING WITH AN OSTOMY  Emptying Your Pouch Empty your pouch when it is one-third full (of urine, stool, and/or gas). If you wait until your pouch is fuller than this, it will be more difficult to empty and more noticeable. When you empty your pouch, either put toilet paper in the toilet bowl first, or flush the toilet while you empty the pouch. This will reduce splashing. You can empty the pouch between your legs or to one side while sitting, or while standing or stooping. If you have a 2-piece system, you can snap off the pouch to empty it. Remember that your  stoma may function during this time. If you wish to rinse your pouch after you empty it, a [place] baster can be helpful. When using a baster, squirt water up into the pouch through the opening at the bottom. With a 2-piece system, you can snap off the pouch to rinse it. After rinsing  your pouch, empty it into the toilet. When rinsing your pouch at home, put a few granules of Dreft soap in the rinse water. This helps lubricate and freshen your pouch. The inside of your pouch can be sprayed with non-stick cooking oil (Pam spray). This may help  reduce stool sticking to the inside of the pouch.  Bathing You may shower or bathe with your pouch on or off. Remember that your stoma may function during this time.  The materials you use to wash your stoma and the skin around it should be clean, but they do not need to be sterile.  Wearing Your Pouch During hot weather, or if you perspire a lot in general, wear a cover over your pouch. This may prevent a rash on your skin under the pouch. Pouch covers are sold at ostomy supply stores. Wear the pouch inside your underwear for better support. Watch your weight. Any gain or loss of 10 to 15 pounds or more can change the way your pouch fits.  Going Away From Home A collapsible cup (like those that come in travel kits) or a soft plastic squirt bottle with a pull-up top (like a travel bottle for shampoo) can be used for rinsing your pouch when you are away from home. Tilt the opening of the pouch at an upward angle when using a cup to rinse.  Carry wet wipes or extra tissues to use in public bathrooms.  Carry an extra pouching system with you at all times.  Never keep ostomy supplies in the glove compartment of your car. Extreme heat or cold can damage the skin barriers and adhesive wafers on the pouch.  When you travel, carry your ostomy supplies with you at all times. Keep them within easy reach. Do not pack ostomy supplies in baggage that will be checked  or otherwise separated from you, because your baggage might be lost. If you're traveling out of the country, it is helpful to have a letter stating that you are carrying ostomy supplies as a medical necessity.  If you need ostomy supplies while traveling, look in the yellow pages of the telephone book under "Surgical Supplies." Or call the local ostomy organization to find out where supplies are available.  Do not let your ostomy supplies get low. Always order new pouches before you use the last one.  Reducing Odor Limit foods such as broccoli, cabbage, onions, fish, and garlic in your diet to help reduce odor. Each time you empty your pouch, carefully clean the opening of the pouch, both inside and outside, with toilet paper. Rinse your pouch 1 or 2 times daily after you empty it (see directions for emptying your pouch and going away from home). Add deodorant (such as Super Banish or Nullo) to your pouch. Use air deodorizers in your bathroom. Do not add aspirin to your pouch. Even though aspirin can help prevent odor, it could cause ulcers on your stoma.  When to call the doctor Call the doctor if you have any of the following symptoms: Purple, black, or white stoma Severe cramps lasting more than 6 hours Severe watery discharge from the stoma lasting more than 6 hours No output from the colostomy for 3 days Excessive bleeding from your stoma Swelling of your stoma to more than 1/2-inch larger than usual Pulling inward of your stoma below skin level Severe skin irritation or deep ulcers Bulging or other changes in your abdomen  When to call your ostomy nurse Call your ostomy/enterostomal therapy (WOCN) nurse if any of the following occurs: Frequent leaking of your pouching system Change in size or appearance of your stoma, causing discomfort or problems with your pouch Skin rash or rawness Weight gain or loss that causes problems with your pouch     FREQUENTLY ASKED  QUESTIONS   Why haven't you met any of these folks who have an ostomy?  Well, maybe you have! You just did not recognize them because an ostomy doesn't show. It can be kept secret if you wish. Why, maybe some of your best friends, office associates or neighbors have an ostomy ... you never can tell. People facing ostomy surgery have many quality-of-life questions like: Will you bulge? Smell? Make noises? Will you feel waste leaving your body? Will you be a captive of the toilet? Will you starve? Be a social outcast? Get/stay married? Have babies? Easily bathe, go swimming, bend over?  OK, let's look at what you can expect:   Will you bulge?  Remember, without part of the intestine or bladder, and its contents, you should have a flatter tummy than before. You can expect to wear, with little exception, what you wore before surgery ... and this in-cludes tight clothing and bathing suits.   Will you smell?  Today, thanks to modern odor proof pouching systems, you can walk into an ostomy support group meeting and not smell anything that is foul or offensive. And, for those with an ileostomy or colostomy who are concerned about odor when emptying their pouch, there are in-pouch deodorants that can be used to eliminate any waste odors that may exist.   Will you make noises?  Everyone produces gas, especially if they are an air-swallower. But intestinal sounds that occur from time to time are no differ-ent than a gurgling tummy, and quite often your clothing will muffle any sounds.   Will you feel the waste discharges?  For those with a colostomy or ileostomy there might be a slight pressure when waste leaves your body, but understand that the intestines have no nerve endings, so there will be no unpleasant sensations. Those with a urostomy will probably be unaware of any kidney drainage.   Will you be a captive of the toilet?  Immediately post-op you will spend more time in the bathroom than you will  after your body recovers from surgery. Every person is different, but on average those with an ileostomy or urostomy may empty their pouches 4 to 6 times a day; a little  less if you have a colostomy. The average wear time between pouch system changes is 3 to 5 days and the changing process should take less than 30 minutes.   Will I need to be on a special diet? Most people return to their normal diet when they have recovered from surgery. Be sure to chew your food well, eat a well-balanced diet and drink plenty of fluids. If you experience problems with a certain food, wait a couple of weeks and try it again.  Will there be odor and noises? Pouching systems are designed to be odor-proof or odor-resistant. There are deodorants that can be used in the pouch. Medications are also available to help reduce odor. Limit gas-producing foods and carbonated beverages. You will experience less gas and fewer noises as you heal from surgery.  How much time will it take to care for my ostomy? At first, you may spend a lot of time learning about your ostomy and how to take care of it. As you become more comfortable and skilled at changing the pouching system, it will take very little time to care for it.   Will I be able to return to work? People with ostomies can perform most jobs. As soon as you have healed from surgery, you should be  able to return to work. Heavy lifting (more than 10 pounds) may be discouraged.   What about intimacy? Sexual relationships and intimacy are important and fulfilling aspects of your life. They should continue after ostomy surgery. Intimacy-related concerns should be discussed openly between you and your partner.   Can I wear regular clothing? You do not need to wear special clothing. Ostomy pouches are fairly flat and barely noticeable. Elastic undergarments will not hurt the stoma or prevent the ostomy from functioning.   Can I participate in sports? An ostomy should not  limit your involvement in sports. Many people with ostomies are runners, skiers, swimmers or participate in other active lifestyles. Talk with your caregiver first before doing heavy physical activity.  Will you starve?  Not if you follow doctor's orders at each stage of your post-op adjustment. There is no such thing as an "ostomy diet". Some people with an ostomy will be able to eat and tolerate anything; others may find diffi-culty with some foods. Each person is an individual and must determine, by trial, what is best for them. A good practice for all is to drink plenty of water.   Will you be a social outcast?  Have you met anyone who has an ostomy and is a social outcast? Why should you be the first? Only your attitude and self image will effect how you are treated. No confi-dent person is an Investment banker, corporate.    PROFESSIONAL HELP   Resources are available if you need help or have questions about your ostomy.   Specially trained nurses called Wound, Ostomy Continence Nurses (WOCN) are available for consultation in most major medical centers.  Consider getting an ostomy consult at an outpatient ostomy clinic.   [place] has an Ostomy Clinic run by an Chartered certified accountant at the [facility].  [phone]. [facility] can help set up an appointment   The [employer] ([employer]) is a group made up of many local chapters throughout the [place]. These local groups hold meetings and provide support to prospective and existing ostomates. They sponsor educational events and have qualified visitors to make personal or telephone visits. Contact the [employer]OA for the chapter nearest you and for other educational publications.  More detailed information can be found in Colostomy Guide, a publication of the [employer] ([employer]). Contact [employer] at 1-(623) 750-0426 or visit their web site at YellowSpecialist.at. The website contains links to other sites, suppliers and  resources.  Media planner Start Services: Start at the website to enlist for support.  Your Wound Ostomy (WOCN) nurse may have started this process. [email or web address] Secure Start services are designed to support people as they live their lives with an ostomy or neurogenic bladder. Enrolling is easy and at no cost to the patient. We realize that each person's needs and life journey are different. Through Secure Start services, we want to help people live their life, their way.  #######################################################

## 2022-08-13 NOTE — Transfer of Care (Signed)
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  Last Pain:  Vitals:   08/13/22 0828  TempSrc: Oral  PainSc: 0-No pain         Complications: No notable events documented.

## 2022-08-13 NOTE — Op Note (Signed)
Preoperative diagnosis:  Pelvic Abscess   Diverticulitis Postoperative diagnosis:  Same   Procedure: Cystoscopy Instillation of ureteral firefly constrast   Surgeon: Crist Fat, MD   Anesthesia: General   Complications: None   Intraoperative findings: normal bladder, orthotopic ureteral orifice   EBL: Minimal   Specimens: None   Indication:  Alexandria [name]  is a 77 y.o.  patient with diverticular abscess colovesical fistula.  Dr. Michaell Cowing requested cystoscopy and instillation of firefly contrast to help facilitate the dissection of the sigmoid colon.  After reviewing the management options for treatment, he elected to proceed with the above surgical procedure(s). We have discussed the potential benefits and risks of the procedure, side effects of the proposed treatment, the likelihood of the patient achieving the goals of the procedure, and any potential problems that might occur during the procedure or recuperation. Informed consent has been obtained.   Description of procedure:   The patient was taken to the operating room and general anesthesia was induced.  The patient was placed in the dorsal lithotomy position, prepped and draped in the usual sterile fashion, and preoperative antibiotics were administered. A preoperative time-out was performed.    A 21 French 30 degree cystoscope was gently passed through the patient's urethra into the bladder.  The bladder was subsequently emptied and then filled slowly up performing a 360 degrees cystoscopic evaluation.  This demonstrated orthotopic ureteral orifices, normal bladder mucosa with an area of heaped mucosa and bullous edema in the dome -  evidence of colovesical fistula without mucosal abnormality.   I then advanced a 5 [place] open-ended ureteral catheter into the patient's left ureteral orifice and  I then advanced the catheter up into the proximal ureter and then slowly pulled back and injected 7.12ml of the firefly  contrast.  Subsequently turned my attention to the patient's right ureteral orifice and performed a similar task.   I then  placed a 16 [place] Foley.    The surgery was then turned over to Dr. Michaell Cowing for facilitation of the remainder of the case.

## 2022-08-13 NOTE — Interval H&P Note (Signed)
History and Physical Interval Note:  08/13/2022 8:48 AM  Alexandria [name]  has presented today for surgery, with the diagnosis of COLOSTOMY FOR COLON RESECTION. DESIRE FOR OSTOMY TAKEDOWN.  The various methods of treatment have been discussed with the patient and family. After consideration of risks, benefits and other options for treatment, the patient has consented to  Procedure(s): ROBOTIC OSTOMY TAKEDOWN (N/A) LYSIS OF ADHESION (N/A) RIGID PROCTOSCOPY (N/A) CYSTOSCOPY with FIREFLY INJECTION (N/A) as a surgical intervention.  The patient's history has been reviewed, patient examined, no change in status, stable for surgery.  I have reviewed the patient's chart and labs.  Questions were answered to the patient's satisfaction.    I have re-reviewed the the patient's records, history, medications, and allergies.  I have re-examined the patient.  I again discussed intraoperative plans and goals of post-operative recovery.  The patient agrees to proceed.  HARBOUR CONVEY  1946-02-24 [id number]  Patient Care Team: Zola Button, Grayling Congress, DO as PCP - General (Family Medicine) Thomasene Ripple, DO as PCP - Cardiology (Cardiology) Freddy Finner, MD (Inactive) as Consulting Physician (Obstetrics and Gynecology) Thomasene Ripple, DO as Consulting Physician (Cardiology) Imogene Burn, MD as Consulting Physician (Gastroenterology) Karie Soda, MD as Consulting Physician (General Surgery)  Patient Active Problem List   Diagnosis Date Noted   Colostomy care [facility]) 07/31/2022   Bronchospasm 06/16/2022   Temporal arteritis (HCC) 06/16/2022   Chronic obstructive pulmonary disease with (acute) lower respiratory infection (HCC) 06/16/2022   Angina pectoris with documented spasm (HCC) 06/16/2022   Abdominal aortic aneurysm (AAA) without rupture (HCC) 06/16/2022   Right ankle sprain 06/09/2022   Sprain of right ankle 06/01/2022   Dizziness 06/01/2022   Rectal abscess 06/01/2022   Acute right ankle  pain 05/19/2022   Irritant contact dermatitis associated with fecal stoma 05/02/2022   Slow transit constipation 05/02/2022   Colostomy complication (HCC) 05/02/2022   IDA (iron deficiency anemia) 04/19/2022   Intra-abdominal abscess (HCC) 04/18/2022   Acute blood loss anemia 04/17/2022   Chronic diastolic CHF (congestive heart failure) (HCC) 04/17/2022   Toxic metabolic encephalopathy 04/17/2022   Ileus following gastrointestinal surgery (HCC) 04/17/2022   Pelvic abscess in female 03/30/2022   Flat foot 11/19/2021   S/P total knee arthroplasty, left 11/13/2021   Ankle impingement syndrome, left 11/13/2021   Rectal bleeding 08/22/2021   Abnormal thyroid blood test 08/22/2021   Abnormal kidney function 08/22/2021   Dumping syndrome 03/13/2020   Arthritis    Bronchial pneumonia    Colon polyp    DVT (deep venous thrombosis) (HCC)    Heart murmur    Migraines    Pancreatitis    Thyroid disease    Aortic root dilatation (HCC) 11/24/2019   Leukocytosis 11/06/2019   Diarrhea 11/06/2019   Generalized abdominal pain 11/06/2019   Pre-ulcerative corn or callous 09/07/2019   Nausea 09/07/2019   Pain of left calf 05/09/2019   Tibial pain 05/09/2019   Essential hypertension 02/08/2019   Healthcare maintenance 11/10/2018   Bradycardia 07/22/2018   Pansinusitis 06/23/2018   History of transient ischemic attack (TIA) 03/24/2018   IPF (idiopathic pulmonary fibrosis) (HCC) 03/24/2018   Allergic rhinitis 02/16/2018   Eustachian tube dysfunction, bilateral 02/16/2018   URI (upper respiratory infection) 02/16/2018   Seasonal allergic rhinitis due to pollen 02/16/2018   ILD (interstitial lung disease) (HCC) 01/07/2018   Dyspnea 01/07/2018   Acute respiratory failure (HCC) 01/06/2018   TIA (transient ischemic attack) 01/06/2018   Hypokalemia 12/15/2017   Chronic  cough 11/26/2017   Basal cell carcinoma (BCC) of neck 06/07/2017   Anisometropia 06/02/2017   Drusen of macula of both eyes  06/02/2017   Photopsia of left eye 06/02/2017   Cardiac murmur 05/07/2017   Situational anxiety 05/07/2017   Headache 05/07/2017   Mitral and aortic insufficiency 05/07/2017   Migraine without status migrainosus, not intractable 06/09/2016   Insomnia 11/18/2015   Normal coronary arteries 05/20/2015   RBBB 05/20/2015   Abnormal CT of the chest 05/20/2015   Hx of myocardial infarction 05/08/2015   Bruising 05/08/2015   Upper airway cough syndrome 02/14/2015   Asthmatic bronchitis with acute exacerbation 02/06/2015   Mild intermittent asthma without complication 02/06/2015   Oliguria 12/28/2014   Hypothyroidism 10/25/2014   Fatigue 10/25/2014   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   Chronic venous insufficiency 10/24/2014   Varicose veins of leg with complications 10/24/2014   Degeneration of intervertebral disc of cervical region 06/29/2014   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   Generalized osteoarthritis of multiple sites 06/29/2014   History of stroke without residual deficits 06/29/2014   Palpitations 11/08/2013   Edema of lower extremity 10/03/2013   Hair loss 11/25/2012   Alopecia 11/25/2012   Diverticulitis of large intestine with perforation and abscess 07/14/2012   Hammer toe 06/13/2012   History of stress test 05/21/2011   Hx of echocardiogram 01/27/2010   H/O cardiac catheterization 2004    Past Medical History:  Diagnosis Date   Arthritis    Bronchial pneumonia    Colon polyp    Diverticulitis    DVT (deep venous thrombosis) (HCC)    lower extremity   H/O cardiac catheterization 2004   Normal coronary arteries   Heart murmur    History of stress test 05/21/2011   Hx of echocardiogram 01/27/2010   Normal Ef 55% the transmitral spectral doppler flow pattern is normal for age. the left ventricular wall motion is normal   Hypothyroidism    IPF (idiopathic pulmonary fibrosis) (HCC) 01/2018   Myocardial infarction (HCC)    hx of 30 years  ago   Pancreatitis    Thyroid disease    TIA (transient ischemic attack)    8 years ago    Past Surgical History:  Procedure Laterality Date   BREAST BIOPSY Left    [place]   CARDIAC CATHETERIZATION     10/2013   CATARACT EXTRACTION Bilateral 03/22/2018   LEFT HEART CATHETERIZATION WITH CORONARY ANGIOGRAM N/A 10/25/2013   Procedure: LEFT HEART CATHETERIZATION WITH CORONARY ANGIOGRAM;  Surgeon: Lennette Bihari, MD;  Location[facility] CATH LAB;  Service: Cardiovascular;  Laterality: N/A;   NECK SURGERY     ACDF x 2      done in [place] more than 25 years, doesn't know levels   TOTAL KNEE ARTHROPLASTY     Bilateral x's 2   VAGINAL HYSTERECTOMY  10/17/1998   Ron Jennette Kettle   VIDEO BRONCHOSCOPY Bilateral 01/24/2018   Procedure: VIDEO BRONCHOSCOPY WITH FLUORO;  Surgeon: Lupita Leash, MD;  Location: [facility] ENDOSCOPY;  Service: Cardiopulmonary;  Laterality: Bilateral;   XI ROBOTIC ASSISTED LOWER ANTERIOR RESECTION N/A 03/31/2022   Procedure: XI ROBOTIC ASSISTED LOWER ANTERIOR RESECTION;  Surgeon: Karie Soda, MD;  Location: [facility] ORS;  Service: General;  Laterality: N/A;    Social History   Socioeconomic History   Marital status: Married    Spouse name: Tasia Catchings   Number of children: 3   Years of education: [employer]   Highest education level: Not on file  Occupational History   Occupation: Leisure centre manager: UNEMPLOYED   Occupation: retired  Tobacco Use   Smoking status: Never   Smokeless tobacco: Never  Building services engineer Use: Never used  Substance and Sexual Activity   Alcohol use: No    Alcohol/week: 0.0 standard drinks of alcohol   Drug use: No   Sexual activity: Not Currently  Other Topics Concern   Not on file  Social History Narrative   Lives with husband Tasia Catchings   Caffeine use: coffee (2 cups per day)   Mostly right-handed   Social Determinants of Health   Financial Resource Strain: Not on file  Food Insecurity: No Food Insecurity (03/31/2022)   Hunger Vital  Sign    Worried About Running Out of Food in the Last Year: Never true    Ran Out of Food in the Last Year: Never true  Transportation Needs: No Transportation Needs (03/31/2022)   PRAPARE - Administrator, Civil Service (Medical): No    Lack of Transportation (Non-Medical): No  Physical Activity: Not on file  Stress: Not on file  Social Connections: Not on file  Intimate Partner Violence: Not At Risk (03/31/2022)   Humiliation, Afraid, Rape, and Kick questionnaire    Fear of Current or Ex-Partner: No    Emotionally Abused: No    Physically Abused: No    Sexually Abused: No    Family History  Problem Relation Age of Onset   Ovarian cancer Mother    Uterine cancer Mother    Lung cancer Father    HIV Brother        1982   Kidney cancer Brother    Lung cancer Brother    Other Brother        Mouth Cancer   Colon cancer Maternal Aunt    Heart disease Maternal Grandmother    Stroke Maternal Grandmother    Hypertension Maternal Grandmother    Diabetes Maternal Grandmother    Arthritis Other    Esophageal cancer Neg Hx    Liver disease Neg Hx    Rectal cancer Neg Hx    Stomach cancer Neg Hx     Medications Prior to Admission  Medication Sig Dispense Refill Last Dose   amoxicillin-clavulanate (AUGMENTIN) 875-125 MG tablet Take 1 tablet by mouth 2 (two) times daily. 20 tablet 0    aspirin EC 81 MG tablet Take 81 mg by mouth daily. Swallow whole.   Past Week   benzonatate (TESSALON PERLES) 100 MG capsule Take 1 capsule (100 mg total) by mouth 3 (three) times daily as needed for cough. 20 capsule 0 Past Week   OFEV 100 MG CAPS Take 100 mg by mouth daily.   08/12/2022   sodium chloride (OCEAN) 0.65 % SOLN nasal spray Place 1 spray into both nostrils as needed for congestion.   Past Week   SYNTHROID 112 MCG tablet TAKE 1 TABLET BY MOUTH DAILY BEFORE BREAKFAST. 90 tablet 0 08/13/2022   vitamin C (ASCORBIC ACID) 500 MG tablet Take 500 mg by mouth daily.   Past Week     Current Facility-Administered Medications  Medication Dose Route Frequency Provider Last Rate Last Admin   bupivacaine liposome (EXPAREL) 1.3 % injection 266 mg  20 mL Infiltration Once Karie Soda, MD       cefoTEtan (CEFOTAN) 2 g in sodium chloride 0.9 % 100 mL IVPB  2 g Intravenous On Call to OR Karie Soda, MD  Chlorhexidine Gluconate Cloth 2 % PADS 6 each  6 each Topical Once Karie Soda, MD       And   Chlorhexidine Gluconate Cloth 2 % PADS 6 each  6 each Topical Once Karie Soda, MD       Melene Muller ON 08/14/2022] feeding supplement (ENSURE PRE-SURGERY) liquid 296 mL  296 mL Oral Once Karie Soda, MD       feeding supplement (ENSURE PRE-SURGERY) liquid 592 mL  592 mL Oral Once Karie Soda, MD       lactated ringers infusion   Intravenous Continuous Kaylyn Layer, MD 10 mL/hr at 08/13/22 0834 New Bag at 08/13/22 0834     Allergies  Allergen Reactions   Prednisone Other (See Comments)    Changed personality    Dilaudid [Hydromorphone] Itching and Other (See Comments)    Dilaudid caused marked confusion   Atrovent Hfa [Ipratropium Bromide Hfa] Itching and Other (See Comments)    Pt could not sleep   Cheratussin Ac [Guaifenesin-Codeine] Other (See Comments)    Headaches    Diltiazem Itching and Other (See Comments)    Makes patient sick. Shakes. GI   Hydrocodone Itching   Influenza Vaccines Other (See Comments)    Pt reports heart attack after last flu shot   Morphine Itching   Motrin [Ibuprofen] Itching and Other (See Comments)    "Gives false reading in blood"   Oxycodone Itching   Codeine Itching, Nausea Only and Other (See Comments)    Pt takes promethazine with codeine at home    BP (!) 172/71   Pulse 75   Temp 97.9 F (36.6 C) (Oral)   Resp 18   Wt 63 kg   SpO2 96%   BMI 24.05 kg/m   Labs: No results found. However, due to the size of the patient record, not all encounters were searched. Please check Results Review for a complete set of  results.  Imaging / Studies: DG Chest 2 View  Result Date: 07/24/2022 CLINICAL DATA:  Cough for months. Chronic obstructive pulmonary disease with acute lower respiratory infection. EXAM: CHEST - 2 VIEW COMPARISON:  Radiograph 4 days ago 07/17/2022, 06/13/2022. High-resolution CT 12/10/2021 FINDINGS: Worsening ill-defined left suprahilar opacity. Background chronic interstitial lung disease with subpleural reticulation. Stable heart size and mediastinal contours. Minimal blunting of right costophrenic angle may represent small effusion. No pneumothorax. Mild scoliotic curvature of the spine. IMPRESSION: 1. Worsening ill-defined left suprahilar opacity. This has increased over recent sequential exams. Differential considerations include infection, focal worsening of interstitial lung disease or less likely central lung lesion. Consider chest CT characterization. 2. Background chronic interstitial lung disease. Electronically Signed   By: Narda Rutherford M.D.   On: 07/24/2022 16:00     .Ardeth Sportsman, M.D., F.A.C.S. Gastrointestinal and Minimally Invasive Surgery [facility] [address] [place], [place] [address] [phone] Main / Paging  08/13/2022 8:48 AM    Ardeth Sportsman

## 2022-08-13 NOTE — Anesthesia Postprocedure Evaluation (Signed)
Anesthesia Post Note  Patient: Alexandria [name]  Procedure(s) Performed: ROBOTIC OSTOMY TAKEDOWN, SMALL BOWEL RESECTION X 2, BILATERAL TAP BLOCK, TISSUE PERFUSSION ASSESMENT VIA FIREFLY, MOBILAZATION OF SPLENIC FLEXURE LYSIS OF ADHESION, DIVERTING ILEOSTOMY FLEXIBLE SIGMOIDOSCOPY CYSTOSCOPY with FIREFLY INJECTION ([place])     Patient location during evaluation: PACU Anesthesia Type: General Level of consciousness: awake Pain management: pain level controlled Vital Signs Assessment: post-procedure vital signs reviewed and stable Respiratory status: spontaneous breathing Cardiovascular status: stable Postop Assessment: no apparent nausea or vomiting Anesthetic complications: no  No notable events documented.  Last Vitals:  Vitals:   08/13/22 1800 08/13/22 1815  BP:    Pulse: 62 (!) 59  Resp:    Temp:  (!) 36.3 C  SpO2:      Last Pain:  Vitals:   08/13/22 1800  TempSrc:   PainSc: 3                  [employer]

## 2022-08-13 NOTE — H&P (Signed)
77-year-old otherwise healthy female presents today for further discussion of her pelvic organ prolapse.   The patient states that about a year ago she started noticing symptoms of prolapse. She feels a bulge and can now see it. She has tried 4 different pessaries that have not been successful in placement or comfort. She is eager to find a more definitive solution to it.   The patient has developed urinary urge associated incontinence. She is unable to get to the bathroom in time, she has flooding incontinence. This happens daily or every other day. In the afternoons she oftentimes will have difficulty getting her stream started. In the morning and sometimes at night she has no trouble getting her stream started and her stream is quite strong. She does not have any associated stress incontinence. She does not have any constipation or difficulty with bowel movements.   The patient has a history of a hysterectomy. She otherwise takes very little medication. She has been started on Estrace cream in the past, but did not use it very often. She also takes an estrogen tablet.   UDS - normal capacity bladder, adequate detrusor function, no SUI, minimal PVR   Intv: Returns today for preop appt. UDS demonstrated no evidence of SUI. No changes to the above.     ALLERGIES: No Known Allergies    MEDICATIONS: Hydrochlorothiazide 12.5 mg tablet  Calcium  Estradiol 0.5 mg tablet  Melatonin  Multivitamin  Pramipexole Dihydrochloride 1 mg tablet  Tylenol  Vitamin D3     GU PSH: Complex cystometrogram, w/ void pressure and urethral pressure profile studies, any technique - 06/30/2022 Complex Uroflow - 06/30/2022 Emg surf Electrd - 06/30/2022 Inject For cystogram - 06/30/2022 Intrabd voidng Press - 06/30/2022     NON-GU PSH: Cataract surgery Eye Surgery (Unspecified) Hysterectomy     GU PMH: Urinary Urgency - 06/30/2022, - 06/26/2022, - 01/30/2022 Cystocele, midline - 06/26/2022, - 05/22/2022, -  05/19/2022, - 04/23/2022, - 03/23/2022, - 03/06/2022, - 02/23/2022, - 02/20/2022, - 02/13/2022, - 01/30/2022    NON-GU PMH: Skin Cancer, History    FAMILY HISTORY: Heart problem - Father Tuberculosis - Mother   SOCIAL HISTORY: Marital Status: Married Preferred Language: English; Ethnicity: Not Hispanic Or Latino; Race: White Current Smoking Status: Patient has never smoked.   Tobacco Use Assessment Completed: Used Tobacco in last 30 days? Does not drink anymore.  Drinks 4+ caffeinated drinks per day.    REVIEW OF SYSTEMS:    GU Review Female:   Patient denies frequent urination, hard to postpone urination, burning /pain with urination, get up at night to urinate, leakage of urine, stream starts and stops, trouble starting your stream, have to strain to urinate, and being pregnant.  Gastrointestinal (Upper):   Patient denies nausea, vomiting, and indigestion/ heartburn.  Gastrointestinal (Lower):   Patient denies constipation and diarrhea.  Constitutional:   Patient denies fever, night sweats, weight loss, and fatigue.  Skin:   Patient denies skin rash/ lesion and itching.  Eyes:   Patient denies blurred vision and double vision.  Ears/ Nose/ Throat:   Patient denies sore throat and sinus problems.  Hematologic/Lymphatic:   Patient denies swollen glands and easy bruising.  Cardiovascular:   Patient denies leg swelling and chest pains.  Respiratory:   Patient denies cough and shortness of breath.  Endocrine:   Patient denies excessive thirst.  Musculoskeletal:   Patient denies back pain and joint pain.  Neurological:   Patient denies headaches and dizziness.  Psychologic:   Patient   denies depression and anxiety.   VITAL SIGNS:      07/16/2022 12:35 PM  Weight 120 lb / 54.43 kg  Height 62 in / 157.48 cm  BP 157/75 mmHg  Pulse 74 /min  BMI 21.9 kg/m   GU PHYSICAL EXAMINATION:    External Genitalia: No hirsutism, no rash, no scarring, no cyst, no erythematous lesion, no papular  lesion, no blanched lesion, no warty lesion. No edema.  Urethral Meatus: Normal size. Normal position. No discharge.  Urethra: No tenderness, no mass, no scarring. No hypermobility. No leakage.  Bladder: Normal to palpation, no tenderness, no mass, normal size.   Vagina: Small rectocele. Large cystocele. No atrophy, no stenosis. No enterocele.    MULTI-SYSTEM PHYSICAL EXAMINATION:    Constitutional: Well-nourished. No physical deformities. Normally developed. Good grooming.  Respiratory: Normal breath sounds. No labored breathing, no use of accessory muscles.   Cardiovascular: Regular rate and rhythm. No murmur, no gallop. Normal temperature, normal extremity pulses, no swelling, no varicosities.      Complexity of Data:  Source Of History:  Patient  Records Review:   Previous Doctor Records, Previous Patient Records, POC Tool  Urine Test Review:   Urinalysis  Urodynamics Review:   Review Bladder Scan, Review Urodynamics Tests   PROCEDURES:          Pelvic Examination - [id number] chaperone - Jillian Angel         Urinalysis Dipstick Dipstick Cont'd  Color: Yellow Bilirubin: Neg mg/dL  Appearance: Clear Ketones: Neg mg/dL  Specific Gravity: 1.010 Blood: Neg ery/uL  pH: 6.0 Protein: Neg mg/dL  Glucose: Neg mg/dL Urobilinogen: 0.2 mg/dL    Nitrites: Neg    Leukocyte Esterase: Neg leu/uL    ASSESSMENT:      ICD-10 Details  1 GU:   Cystocele, midline - N81.11    PLAN:           Document Letter(s):  Created for Patient: Clinical Summary         Notes:   Plan for robotic sacrocolpopexy. She doesn't need a sling. I have discussed the risk/benefits of this operation with her and she has opted to proceed. We discussed the expected outcomes. We also discussed the hospital course as well as the recovery.     

## 2022-08-14 ENCOUNTER — Encounter (HOSPITAL_COMMUNITY): Payer: Self-pay | Admitting: Surgery

## 2022-08-14 ENCOUNTER — Other Ambulatory Visit: Payer: Self-pay

## 2022-08-14 DIAGNOSIS — G8929 Other chronic pain: Secondary | ICD-10-CM

## 2022-08-14 DIAGNOSIS — Z932 Ileostomy status: Secondary | ICD-10-CM

## 2022-08-14 DIAGNOSIS — R1013 Epigastric pain: Secondary | ICD-10-CM | POA: Insufficient documentation

## 2022-08-14 DIAGNOSIS — I5189 Other ill-defined heart diseases: Secondary | ICD-10-CM | POA: Insufficient documentation

## 2022-08-14 DIAGNOSIS — K9409 Other complications of colostomy: Secondary | ICD-10-CM | POA: Insufficient documentation

## 2022-08-14 HISTORY — DX: Other chronic pain: G89.29

## 2022-08-14 LAB — CBC
HCT: 33.9 % — ABNORMAL LOW (ref 36.0–46.0)
Hemoglobin: 10.9 g/dL — ABNORMAL LOW (ref 12.0–15.0)
MCH: 32.1 pg (ref 26.0–34.0)
MCHC: 32.2 g/dL (ref 30.0–36.0)
MCV: 99.7 fL (ref 80.0–100.0)
Platelets: 220 10*3/uL (ref 150–400)
RBC: 3.4 MIL/uL — ABNORMAL LOW (ref 3.87–5.11)
RDW: 14.5 % (ref 11.5–15.5)
WBC: 7.5 10*3/uL (ref 4.0–10.5)
nRBC: 0 % (ref 0.0–0.2)

## 2022-08-14 LAB — BASIC METABOLIC PANEL
Anion gap: 8 (ref 5–15)
BUN: 12 mg/dL (ref 8–23)
CO2: 24 mmol/L (ref 22–32)
Calcium: 7.9 mg/dL — ABNORMAL LOW (ref 8.9–10.3)
Chloride: 102 mmol/L (ref 98–111)
Creatinine, Ser: 0.81 mg/dL (ref 0.44–1.00)
GFR, Estimated: >60 mL/min (ref >60)
Glucose, Bld: 104 mg/dL — ABNORMAL HIGH (ref 70–99)
Potassium: 3.6 mmol/L (ref 3.5–5.1)
Sodium: 134 mmol/L — ABNORMAL LOW (ref 135–145)

## 2022-08-14 LAB — MAGNESIUM: Magnesium: 1.6 mg/dL — ABNORMAL LOW (ref 1.7–2.4)

## 2022-08-14 MED ORDER — TRAMADOL HCL 50 MG PO TABS: 50.0000 mg | ORAL_TABLET | Freq: Four times a day (QID) | ORAL | 0 refills | Status: DC | PRN

## 2022-08-14 MED ORDER — PIPERACILLIN-TAZOBACTAM 3.375 G IVPB
3.3750 g | Freq: Three times a day (TID) | INTRAVENOUS | Status: AC
  Administered 2022-08-14 – 2022-08-19 (×14): 3.375 g via INTRAVENOUS
  Filled 2022-08-14 (×14): qty 50

## 2022-08-14 MED ORDER — PROPOFOL 10 MG/ML IV BOLUS
INTRAVENOUS | Status: AC
  Filled 2022-08-14: qty 20

## 2022-08-14 NOTE — Progress Notes (Signed)
Nutrition Education Note  RD consulted for nutrition education regarding a diet status post ileostomy.  RD provided "Ileostomy Nutrition Therapy" handout from the Academy of [employer]. Encouraged patient to keep fiber intake below 8 grams during transition from liquids to solids, and below 13 grams per day as symptoms subside. Encouraged patient to consume refined and white grain foods with less than 2g of fiber per serving as well as soft, well-cooked proteins, and well cooked vegetables without seeds or skin.  RD discussed why it is important to adhere to list of recommended foods, and foods to avoid, to maintain ostomy integrity and decrease risk of gas, diarrhea, and blockage related complications. Encouraged patient to consume 8 to 10 cups of liquid per day and to drink liquids 30 minutes after meals or snacks to prevent flushing.   Provided tips to ensure adequate absorption of medications, vitamins, and minerals. After 6 weeks, as patient's diet returns to normal, encouraged adding 1 new food in per day, for a few days to monitor tolerance.  Expect fair compliance. Pt with a lot of food preferences but states she will be compliant with the diet as it is temporary. Pt with history of colostomy so familiar with what she can tolerate.  Body mass index is 24.83 kg/m. Pt meets criteria for normal based on current BMI.   Current diet order is full liquids, patient is consuming liquids and tolerating.  Labs and medications reviewed. No further nutrition interventions warranted at this time. If additional nutrition issues arise, please re-consult RD.  Tilda Franco, MS, RD, LDN Inpatient Clinical Dietitian Contact information available via Amion

## 2022-08-14 NOTE — Progress Notes (Signed)
Alexandria [name] [id number] 22-Oct-1945  CARE TEAM:  PCP: Donato Schultz, DO  Outpatient Care Team: Patient Care Team: Zola Button, Grayling Congress, DO as PCP - General (Family Medicine) Thomasene Ripple, DO as PCP - Cardiology (Cardiology) Freddy Finner, MD (Inactive) as Consulting Physician (Obstetrics and Gynecology) Thomasene Ripple, DO as Consulting Physician (Cardiology) Imogene Burn, MD as Consulting Physician (Gastroenterology) Karie Soda, MD as Consulting Physician (General Surgery)  Inpatient Treatment Team: Treatment Team: Attending Provider: Karie Soda, MD; WOC Nurse: Link Snuffer, RN; Pharmacist: Danford Bad, [facility]; Registered Nurse: Merlyn Albert, RN   Problem List:   Principal Problem:   Diverticulitis of colon Active Problems:   Ileostomy in place [facility])   Hypothyroidism   Fatigue   Degeneration of intervertebral disc of cervical region   Hx of myocardial infarction   Situational anxiety   ILD (interstitial lung disease) (HCC)   IPF (idiopathic pulmonary fibrosis) (HCC)   Pelvic abscess in female   Blowout of rectal stump (HCC)   1 Day Post-Op  08/13/2022  POST-OPERATIVE DIAGNOSIS:   -COLOSTOMY FOR COLON RESECTION. DESIRE FOR OSTOMY TAKEDOWN -CHRONIC RECTAL STUMP DEHISCENCE -PELVIC ABSCESS -HISTORY OF DIVERTICULITIS WITH STRICTURE & ABSCESS S/P HARTMANN COLECTOMY/COLOSTOMY   PROCEDURE:   -ROBOTIC COLOSTOMY TAKEDOWN WITH LOW COLORECTAL ANASTOMOSIS  -DIVERTING LOOP ILEOSTOMY -MOBILIZATION OF SPLENIC FLEXURE OF COLON -SMALL BOWEL RESECTION X 2 -LYSIS OF ADHESIONS X 2.5 HOURS (60% CASE) -FLEXIBLE SIGMOIDOSCOPY -INTRAOPERATIVE ASSESSMENT OF TISSUE VASCULAR PERFUSION USING ICG (indocyanine green) IMMUNOFLUORESCENCE -TRANSVERSUS ABDOMINIS PLANE (TAP) BLOCK - BILATERAL   SURGEON:  Ardeth Sportsman, MD   OR FINDINGS:  Very dense adhesions of especially small bowel to the pelvis with ileal serosa acting as a patch over a chronic rectal  stump dehiscence.  Small abscess nearby.  Ileal small bowel resection x 2 needed.  Rectal stump foreshortened and retracted.   Splenic flexure mobilization and high ligation of IMA/IMV pedicle needed for the colostomy to reach down in the very low pelvis.  ICG firefly done to confirm good perfusion.   It is a 29 EEA (mid descending to distal rectal) anastomosis.  At rest 5-6 cm from the anal verge.  Because of a low rectal anastomosis with abscess nearby and poor tissues, temporary diverting loop ileostomy done.      Assessment  Stable  [facility] Stay = 1 days)  Plan:  -ERAS protocol.  If tolerates clears advance to dysphagia 1 diet.  Gradually advance diet over weekend.  IV antibiotics x 5 days postop given small abscess in pelvis to minimize risk of recurrent infection which she has had numerous times in the past.  Ileostomy care.  Noted the reasoning for the need for temporary diverting loop ileostomy to protect her very low fragile anastomosis.  Will have to help follow and make sure that she is not struggling with high output ileostomy diarrhea issues.  Start with soluble fiber first.  Keep drain for now.  Perhaps can remove at the time of discharge versus longer. We will see   Interstitial lung disease.  Stable on room air.  Follow-up.  Chronic hypothyroidism.  Resume Synthroid.  VTE prophylaxis- SCDs, etc  mobilize as tolerated to help recovery  Disposition:  Disposition:  The patient is from: Home  Anticipate discharge to:  Home with Home Health  Anticipated Date of Discharge is:  May 15,2024    Barriers to discharge:  Pending Clinical improvement (more likely than not)  Patient currently is NOT MEDICALLY  STABLE for discharge from the hospital from a surgery standpoint.      I reviewed nursing notes, last 24 h vitals and pain scores, last 48 h intake and output, last 24 h labs and trends, and last 24 h imaging results. I have reviewed this patient's  available data, including medical history, events of note, test results, etc as part of my evaluation.  A significant portion of that time was spent in counseling.  Care during the described time interval was provided by me.  This care required moderate level of medical decision making.  08/14/2022    Subjective: (Chief complaint)  Patient with some soreness but mostly controlled.  Sticking to water and ice chips so far.  No nausea or vomiting.  Pain seems adequately controlled.  Do not generally bloody fluid out her drain.  Tapering off.  Objective:  Vital signs:  Vitals:   08/13/22 2151 08/14/22 0150 08/14/22 0500 08/14/22 0522  BP: (!) 168/75 (!) 152/73  (!) 151/70  Pulse: 69 76  83  Resp: 18 18  18   Temp:  97.8 F (36.6 C)  98.3 F (36.8 C)  TempSrc:  Oral  Oral  SpO2: 95% 93%  92%  Weight:   65.1 kg        Intake/Output   Yesterday:  05/09 0701 - 05/10 0700 In: 3382.2 [P.O.:180; I.V.:2752.2; IV Piggyback:450] Out: 1040 [Urine:525; Drains:415; Blood:100] This shift:  No intake/output data recorded.  Bowel function:  Flatus: No  BM:  No  Drain: Serosanguinous   Physical Exam:  General: Pt awake/alert in no acute distress Eyes: PERRL, normal EOM.  Sclera clear.  No icterus Neuro: CN II-XII intact w/o focal sensory/motor deficits. Lymph: No head/neck/groin lymphadenopathy Psych:  No delerium/psychosis/paranoia.  Oriented x 4 HENT: Normocephalic, Mucus membranes moist.  No thrush Neck: Supple, No tracheal deviation.  No obvious thyromegaly Chest: No pain to chest wall compression.  Good respiratory excursion.  No audible wheezing CV:  Pulses intact.  Regular rhythm.  No major extremity edema MS: Normal AROM mjr joints.  No obvious deformity  Abdomen: Soft.  Nondistended.  Mildly tender at incisions only.  No evidence of peritonitis.  No incarcerated hernias. Loop ileostomy left lower quadrant pink with scant dark succus and no flatus in bag  Ext:   No  deformity.  No mjr edema.  No cyanosis Skin: No petechiae / purpurea.  No major sores.  Warm and dry    Results:   Cultures: No results found for this or any previous visit (from the past 720 hour(s)).  Labs: Results for orders placed or performed during the hospital encounter of 08/13/22 (from the past 48 hour(s))  Type and screen [facility]     Status: None (Preliminary result)   Collection Time: 08/13/22  8:27 AM  Result Value Ref Range   ABO/RH(D) A NEG    Antibody Screen POS    Sample Expiration 08/16/2022,2359    Unit Number [id number]    Blood Component Type RED CELLS,LR    Unit division 00    Status of Unit ALLOCATED    Donor AG Type NEGATIVE FOR KELL ANTIGEN    Transfusion Status OK TO TRANSFUSE    Crossmatch Result COMPATIBLE   Glucose, capillary     Status: Abnormal   Collection Time: 08/13/22  6:41 PM  Result Value Ref Range   Glucose-Capillary 163 (H) 70 - 99 mg/dL    Comment: Glucose reference range applies only to samples taken after  fasting for at least 8 hours.  Basic metabolic panel     Status: Abnormal   Collection Time: 08/14/22  4:50 AM  Result Value Ref Range   Sodium 134 (L) 135 - 145 mmol/L   Potassium 3.6 3.5 - 5.1 mmol/L   Chloride 102 98 - 111 mmol/L   CO2 24 22 - 32 mmol/L   Glucose, Bld 104 (H) 70 - 99 mg/dL    Comment: Glucose reference range applies only to samples taken after fasting for at least 8 hours.   BUN 12 8 - 23 mg/dL   Creatinine, Ser 9.14 0.44 - 1.00 mg/dL   Calcium 7.9 (L) 8.9 - 10.3 mg/dL   GFR, Estimated >78 >29 mL/min    Comment: (NOTE) Calculated using the CKD-EPI Creatinine Equation (2021)    Anion gap 8 5 - 15    Comment: Performed at [facility], [address] [place], [place] [address]  CBC     Status: Abnormal   Collection Time: 08/14/22  4:50 AM  Result Value Ref Range   WBC 7.5 4.0 - 10.5 K/uL   RBC 3.40 (L) 3.87 - 5.11 MIL/uL   Hemoglobin 10.9 (L) 12.0 - 15.0 g/dL    HCT 08.6 (L) 57.8 - 46.0 %   MCV 99.7 80.0 - 100.0 fL   MCH 32.1 26.0 - 34.0 pg   MCHC 32.2 30.0 - 36.0 g/dL   RDW 46.9 62.9 - 52.8 %   Platelets 220 150 - 400 K/uL   nRBC 0.0 0.0 - 0.2 %    Comment: Performed at [facility], [address] [place], [place] [address]  Magnesium     Status: Abnormal   Collection Time: 08/14/22  4:50 AM  Result Value Ref Range   Magnesium 1.6 (L) 1.7 - 2.4 mg/dL    Comment: Performed at [facility], [address] [place], [place] [address]   *Note: Due to a large number of results and/or encounters for the requested time period, some results have not been displayed. A complete set of results can be found in Results Review.    Imaging / Studies: No results found.  Medications / Allergies: per chart  Antibiotics: Anti-infectives (From admission, onward)    Start     Dose/Rate Route Frequency Ordered Stop   08/14/22 0830  piperacillin-tazobactam (ZOSYN) IVPB 3.375 g        3.375 g 12.5 mL/hr over 240 Minutes Intravenous Every 8 hours 08/14/22 0743 08/19/22 0559   08/13/22 2200  cefoTEtan (CEFOTAN) 2 g in sodium chloride 0.9 % 100 mL IVPB        2 g 200 mL/hr over 30 Minutes Intravenous Every 12 hours 08/13/22 1836 08/13/22 2335   08/13/22 1400  neomycin (MYCIFRADIN) tablet 1,000 mg  Status:  Discontinued       See Hyperspace for full Linked Orders Report.   1,000 mg Oral 3 times per day 08/13/22 0814 08/13/22 0816   08/13/22 1400  metroNIDAZOLE (FLAGYL) tablet 1,000 mg  Status:  Discontinued       See Hyperspace for full Linked Orders Report.   1,000 mg Oral 3 times per day 08/13/22 0814 08/13/22 0816   08/13/22 0815  cefoTEtan (CEFOTAN) 2 g in sodium chloride 0.9 % 100 mL IVPB        2 g 200 mL/hr over 30 Minutes Intravenous On call to O.R. 08/13/22 7253 08/13/22 1315         Note: Portions  of this report may have been transcribed using voice recognition software. Every effort was made to  ensure accuracy; however, inadvertent computerized transcription errors may be present.   Any transcriptional errors that result from this process are unintentional.    Ardeth Sportsman, MD, FACS, MASCRS Esophageal, Gastrointestinal & Colorectal Surgery Robotic and Minimally Invasive Surgery  [facility] [address], [address] [place], [place] [address] [phone] Fax [phone] Main  CONTACT INFORMATION:  Weekday (9AM-5PM): Call [facility] main office at [phone]  Weeknight (5PM-9AM) or Weekend/Holiday: Check [email or web address] (password " [email or web address]") for General Surgery [facility] coverage  (Please, do not use SecureChat as it is not reliable communication to reach operating surgeons for immediate patient care given surgeries/outpatient duties/clinic/cross-coverage/off post-call which would lead to a delay in care.  Epic staff messaging available for outptient concerns, but may not be answered for 48 hours or more).     08/14/2022  7:44 AM

## 2022-08-14 NOTE — Consult Note (Signed)
WOC Nurse ostomy consult note Stoma type/location: LLQ ileotomy at previous colostomy site Stomal assessment/size: 1 and 3/4 inch red, edematous  Peristomal assessment:intact  Treatment options for stomal/peristomal skin: Skin barrier ring, convex pouch Output: 100 mls brown liquid effluent Ostomy pouching: 1pc.soft convex pouching system with skin barrier ring  Education provided: Limited today. Patient is experiencing gas pains in her shoulders. Educational folder is provided. She had a previous ostomy, and will need additional information about the differences between a colostomy and an ileostomy.  Note: Patient was not able to achieve independence with changing her colostomy pouch due to difficulty seeing the stoma inferior margin, and presented twice weekly to the outpatient ostomy clinic for pouch changes. She is independent in emptying. She anticipates needing to have assistance changing her pouch until reversal later in the summer.  Enrolled patient in [employer] DC program: Yes  4 pouches, 4 skin barrier rings provided to patient's room.  WOC nursing team will follow, and will remain available to this patient, the nursing and medical teams.    Thank you for inviting [place] to participate in this patient's Plan of Care.  Ladona Mow, MSN, RN, CNS, GNP, Leda Min, [employer], [employer] phone:  [phone]

## 2022-08-14 NOTE — Discharge Instructions (Signed)
Good luck with reversal!

## 2022-08-14 NOTE — Progress Notes (Signed)
Mobility Specialist - Progress Note   08/14/22 1320  Mobility  Activity Stood at bedside  Level of Assistance Contact guard assist, steadying assist  Assistive Device Front wheel walker  Distance Ambulated (ft) 2 ft  Range of Motion/Exercises Active  Activity Response Tolerated well  Mobility Referral Yes  $Mobility charge 1 Mobility  Mobility Specialist Start Time (ACUTE ONLY) 1300  Mobility Specialist Stop Time (ACUTE ONLY) 1310  Mobility Specialist Time Calculation (min) (ACUTE ONLY) 10 min   Pt received in bed and agreed to mobility, pt needed increased time for bed mobility but was IND, pt complained of lots of pain in abdomen, stating burning pain was 11/10.   Pt took 2 steps before saying that the pain was too much and head began to spin.  Pt returned to bed with all needs met.  Marilynne Halsted Mobility Specialist

## 2022-08-14 NOTE — TOC CM/SW Note (Signed)
Transition of Care [facility]) Screening Note  Patient Details  Name: CURLEE KUECK Date of Birth: 21-Jan-1946  Transition of Care [facility]) CM/SW Contact:    Ewing Schlein, LCSW Phone Number: 08/14/2022, 10:29 AM  Transition of Care Department [facility]) has reviewed patient and no TOC needs have been identified at this time. We will continue to monitor patient advancement through interdisciplinary progression rounds. If new patient transition needs arise, please place a TOC consult.

## 2022-08-15 LAB — CREATININE, SERUM
Creatinine, Ser: 0.77 mg/dL (ref 0.44–1.00)
GFR, Estimated: >60 mL/min (ref >60)

## 2022-08-15 LAB — CBC
HCT: 31.9 % — ABNORMAL LOW (ref 36.0–46.0)
Hemoglobin: 10.4 g/dL — ABNORMAL LOW (ref 12.0–15.0)
MCH: 32.6 pg (ref 26.0–34.0)
MCHC: 32.6 g/dL (ref 30.0–36.0)
MCV: 100 fL (ref 80.0–100.0)
Platelets: 186 10*3/uL (ref 150–400)
RBC: 3.19 MIL/uL — ABNORMAL LOW (ref 3.87–5.11)
RDW: 14.6 % (ref 11.5–15.5)
WBC: 9.8 10*3/uL (ref 4.0–10.5)
nRBC: 0 % (ref 0.0–0.2)

## 2022-08-15 LAB — POTASSIUM: Potassium: 3.5 mmol/L (ref 3.5–5.1)

## 2022-08-15 NOTE — Progress Notes (Signed)
Mobility Specialist - Progress Note   08/15/22 1424  Mobility  Activity Stood at bedside  Level of Assistance Standby assist, set-up cues, supervision of patient - no hands on  Assistive Device Front wheel walker  Distance Ambulated (ft) 5 ft  Activity Response Tolerated well  Mobility Referral Yes  $Mobility charge 1 Mobility  Mobility Specialist Start Time (ACUTE ONLY) 0159  Mobility Specialist Stop Time (ACUTE ONLY) 0210  Mobility Specialist Time Calculation (min) (ACUTE ONLY) 11 min   Pt received in bed and agreeable to mobility. Once standing pt stated  "I need to sit down". Despite asking pt what was wrong, pt stated they were okay. Pt declined any further mobility. Pt to bed after session with all needs met.   [facility]

## 2022-08-15 NOTE — Progress Notes (Signed)
2 Days Post-Op   Subjective/Chief Complaint: Complains of a lot of soreness   Objective: Vital signs in last 24 hours: Temp:  [97.8 F (36.6 C)-98.8 F (37.1 C)] 97.8 F (36.6 C) (05/11 0524) Pulse Rate:  [51-78] 51 (05/11 0524) Resp:  [18] 18 (05/11 0524) BP: (152-171)/(62-76) 152/62 (05/11 0524) SpO2:  [93 %-96 %] 95 % (05/11 0524)    Intake/Output from previous day: 05/10 0701 - 05/11 0700 In: 94.7 [IV Piggyback:94.7] Out: 715 [Urine:450; Drains:215; Stool:50] Intake/Output this shift: Total I/O In: -  Out: 45 [Drains:20; Stool:25]  General appearance: alert and cooperative Resp: clear to auscultation bilaterally Cardio: regular rate and rhythm GI: soft, moderate tenderness. Good bs. Ostomy pink and productive  Lab Results:  Recent Labs    08/14/22 0450 08/15/22 0444  WBC 7.5 9.8  HGB 10.9* 10.4*  HCT 33.9* 31.9*  PLT 220 186   BMET Recent Labs    08/14/22 0450 08/15/22 0444  NA 134*  --   K 3.6 3.5  CL 102  --   CO2 24  --   GLUCOSE 104*  --   BUN 12  --   CREATININE 0.81 0.77  CALCIUM 7.9*  --    PT/INR No results for input(s): "LABPROT", "INR" in the last 72 hours. ABG No results for input(s): "PHART", "HCO3" in the last 72 hours.  Invalid input(s): "PCO2", "PO2"  Studies/Results: No results found.  Anti-infectives: Anti-infectives (From admission, onward)    Start     Dose/Rate Route Frequency Ordered Stop   08/14/22 1000  piperacillin-tazobactam (ZOSYN) IVPB 3.375 g        3.375 g 12.5 mL/hr over 240 Minutes Intravenous Every 8 hours 08/14/22 0743 08/19/22 0959   08/13/22 2200  cefoTEtan (CEFOTAN) 2 g in sodium chloride 0.9 % 100 mL IVPB        2 g 200 mL/hr over 30 Minutes Intravenous Every 12 hours 08/13/22 1836 08/13/22 2335   08/13/22 1400  neomycin (MYCIFRADIN) tablet 1,000 mg  Status:  Discontinued       See Hyperspace for full Linked Orders Report.   1,000 mg Oral 3 times per day 08/13/22 0814 08/13/22 0816   08/13/22 1400   metroNIDAZOLE (FLAGYL) tablet 1,000 mg  Status:  Discontinued       See Hyperspace for full Linked Orders Report.   1,000 mg Oral 3 times per day 08/13/22 0814 08/13/22 0816   08/13/22 0815  cefoTEtan (CEFOTAN) 2 g in sodium chloride 0.9 % 100 mL IVPB        2 g 200 mL/hr over 30 Minutes Intravenous On call to O.R. 08/13/22 0814 08/13/22 1325       Assessment/Plan: s/p Procedure(s): ROBOTIC OSTOMY TAKEDOWN, SMALL BOWEL RESECTION X 2, BILATERAL TAP BLOCK, TISSUE PERFUSSION ASSESMENT VIA FIREFLY, MOBILAZATION OF SPLENIC FLEXURE (N/A) LYSIS OF ADHESION, DIVERTING ILEOSTOMY (N/A) FLEXIBLE SIGMOIDOSCOPY (N/A) CYSTOSCOPY with FIREFLY INJECTION (N/A) Advance diet Ostomy output dark. Hg stable -ERAS protocol.   If tolerates clears advance to dysphagia 1 diet.  Gradually advance diet over weekend.   IV antibiotics x 5 days postop given small abscess in pelvis to minimize risk of recurrent infection which she has had numerous times in the past.   Ileostomy care.  Noted the reasoning for the need for temporary diverting loop ileostomy to protect her very low fragile anastomosis.  Will have to help follow and make sure that she is not struggling with high output ileostomy diarrhea issues.  Start with soluble fiber  first.   Keep drain for now.  Perhaps can remove at the time of discharge versus longer. We will see    Interstitial lung disease.  Stable on room air.  Follow-up.   Chronic hypothyroidism.  Resume Synthroid.   VTE prophylaxis- SCDs, etc   mobilize as tolerated to help recovery  LOS: 2 days    Chevis Pretty III 08/15/2022

## 2022-08-15 NOTE — Progress Notes (Signed)
Mobility Specialist - Progress Note   08/15/22 1024  Mobility  Activity Ambulated with assistance to bathroom  Level of Assistance Standby assist, set-up cues, supervision of patient - no hands on  Assistive Device Front wheel walker  Distance Ambulated (ft) 5 ft  Activity Response Tolerated well  Mobility Referral Yes  $Mobility charge 1 Mobility  Mobility Specialist Start Time (ACUTE ONLY) 1005  Mobility Specialist Stop Time (ACUTE ONLY) 1014  Mobility Specialist Time Calculation (min) (ACUTE ONLY) 9 min   Pt received in bed and requesting assistance to bathroom. Pt voiced wanting to ambulate this afternoon after she gets her ostomy emptied out. No complaints during session. Pt to bathroom after session with all needs met & nurse in room.    [facility]

## 2022-08-16 LAB — CBC
HCT: 31.4 % — ABNORMAL LOW (ref 36.0–46.0)
Hemoglobin: 10.3 g/dL — ABNORMAL LOW (ref 12.0–15.0)
MCH: 33.2 pg (ref 26.0–34.0)
MCHC: 32.8 g/dL (ref 30.0–36.0)
MCV: 101.3 fL — ABNORMAL HIGH (ref 80.0–100.0)
Platelets: 199 10*3/uL (ref 150–400)
RBC: 3.1 MIL/uL — ABNORMAL LOW (ref 3.87–5.11)
RDW: 14.8 % (ref 11.5–15.5)
WBC: 7 10*3/uL (ref 4.0–10.5)
nRBC: 0 % (ref 0.0–0.2)

## 2022-08-16 LAB — POTASSIUM: Potassium: 3.4 mmol/L — ABNORMAL LOW (ref 3.5–5.1)

## 2022-08-16 LAB — CREATININE, SERUM
Creatinine, Ser: 0.67 mg/dL (ref 0.44–1.00)
GFR, Estimated: >60 mL/min (ref >60)

## 2022-08-16 NOTE — Progress Notes (Signed)
3 Days Post-Op   Subjective/Chief Complaint: Complains of soreness   Objective: Vital signs in last 24 hours: Temp:  [97.7 F (36.5 C)-98.2 F (36.8 C)] 97.7 F (36.5 C) (05/12 0558) Pulse Rate:  [51-60] 51 (05/12 0558) Resp:  [18-19] 18 (05/12 0558) BP: (126-156)/(52-79) 126/52 (05/12 0558) SpO2:  [94 %-96 %] 95 % (05/12 0558) Last BM Date : 08/16/22  Intake/Output from previous day: 05/11 0701 - 05/12 0700 In: 960 [P.O.:960] Out: 2110 [Urine:800; Drains:235; Stool:1075] Intake/Output this shift: Total I/O In: -  Out: 265 [Urine:200; Drains:15; Stool:50]  General appearance: alert and cooperative Resp: clear to auscultation bilaterally Cardio: regular rate and rhythm GI: soft, mild tenderness. Ostomy pink and productive  Lab Results:  Recent Labs    08/15/22 0444 08/16/22 0445  WBC 9.8 7.0  HGB 10.4* 10.3*  HCT 31.9* 31.4*  PLT 186 199   BMET Recent Labs    08/14/22 0450 08/15/22 0444 08/16/22 0445  NA 134*  --   --   K 3.6 3.5 3.4*  CL 102  --   --   CO2 24  --   --   GLUCOSE 104*  --   --   BUN 12  --   --   CREATININE 0.81 0.77 0.67  CALCIUM 7.9*  --   --    PT/INR No results for input(s): "LABPROT", "INR" in the last 72 hours. ABG No results for input(s): "PHART", "HCO3" in the last 72 hours.  Invalid input(s): "PCO2", "PO2"  Studies/Results: No results found.  Anti-infectives: Anti-infectives (From admission, onward)    Start     Dose/Rate Route Frequency Ordered Stop   08/14/22 1000  piperacillin-tazobactam (ZOSYN) IVPB 3.375 g        3.375 g 12.5 mL/hr over 240 Minutes Intravenous Every 8 hours 08/14/22 0743 08/19/22 0959   08/13/22 2200  cefoTEtan (CEFOTAN) 2 g in sodium chloride 0.9 % 100 mL IVPB        2 g 200 mL/hr over 30 Minutes Intravenous Every 12 hours 08/13/22 1836 08/13/22 2335   08/13/22 1400  neomycin (MYCIFRADIN) tablet 1,000 mg  Status:  Discontinued       See Hyperspace for full Linked Orders Report.   1,000 mg  Oral 3 times per day 08/13/22 0814 08/13/22 0816   08/13/22 1400  metroNIDAZOLE (FLAGYL) tablet 1,000 mg  Status:  Discontinued       See Hyperspace for full Linked Orders Report.   1,000 mg Oral 3 times per day 08/13/22 0814 08/13/22 0816   08/13/22 0815  cefoTEtan (CEFOTAN) 2 g in sodium chloride 0.9 % 100 mL IVPB        2 g 200 mL/hr over 30 Minutes Intravenous On call to O.R. 08/13/22 0814 08/13/22 1325       Assessment/Plan: s/p Procedure(s): ROBOTIC OSTOMY TAKEDOWN, SMALL BOWEL RESECTION X 2, BILATERAL TAP BLOCK, TISSUE PERFUSSION ASSESMENT VIA FIREFLY, MOBILAZATION OF SPLENIC FLEXURE (N/A) LYSIS OF ADHESION, DIVERTING ILEOSTOMY (N/A) FLEXIBLE SIGMOIDOSCOPY (N/A) CYSTOSCOPY with FIREFLY INJECTION (N/A) Advance diet. Allow soft foods today Ambulate Hg stable -ERAS protocol.   If tolerates clears advance to dysphagia 1 diet.  Gradually advance diet over weekend.   IV antibiotics x 5 days postop given small abscess in pelvis to minimize risk of recurrent infection which she has had numerous times in the past.   Ileostomy care.  Noted the reasoning for the need for temporary diverting loop ileostomy to protect her very low fragile anastomosis.  Will have  to help follow and make sure that she is not struggling with high output ileostomy diarrhea issues.  Start with soluble fiber first.   Keep drain for now.  Perhaps can remove at the time of discharge versus longer. We will see    Interstitial lung disease.  Stable on room air.  Follow-up.   Chronic hypothyroidism.  Resume Synthroid.   VTE prophylaxis- SCDs, etc   mobilize as tolerated to help recovery  LOS: 3 days    Chevis Pretty III 08/16/2022

## 2022-08-17 LAB — TYPE AND SCREEN
ABO/RH(D): A NEG
Antibody Screen: POSITIVE
Donor AG Type: NEGATIVE

## 2022-08-17 LAB — BPAM RBC
Blood Product Expiration Date: 202405292359
Unit Type and Rh: 9500

## 2022-08-17 LAB — CBC
HCT: 33.2 % — ABNORMAL LOW (ref 36.0–46.0)
Hemoglobin: 10.8 g/dL — ABNORMAL LOW (ref 12.0–15.0)
MCH: 32.4 pg (ref 26.0–34.0)
MCHC: 32.5 g/dL (ref 30.0–36.0)
MCV: 99.7 fL (ref 80.0–100.0)
Platelets: 225 10*3/uL (ref 150–400)
RBC: 3.33 MIL/uL — ABNORMAL LOW (ref 3.87–5.11)
RDW: 14.7 % (ref 11.5–15.5)
WBC: 6 10*3/uL (ref 4.0–10.5)
nRBC: 0 % (ref 0.0–0.2)

## 2022-08-17 LAB — SURGICAL PATHOLOGY

## 2022-08-17 LAB — CREATININE, SERUM
Creatinine, Ser: 0.7 mg/dL (ref 0.44–1.00)
GFR, Estimated: >60 mL/min (ref >60)

## 2022-08-17 LAB — MAGNESIUM: Magnesium: 1.9 mg/dL (ref 1.7–2.4)

## 2022-08-17 LAB — POTASSIUM: Potassium: 3.1 mmol/L — ABNORMAL LOW (ref 3.5–5.1)

## 2022-08-17 MED ORDER — POTASSIUM CHLORIDE 10 MEQ/100ML IV SOLN
10.0000 meq | INTRAVENOUS | Status: AC
  Administered 2022-08-17 (×4): 10 meq via INTRAVENOUS
  Filled 2022-08-17: qty 100

## 2022-08-17 MED ORDER — POTASSIUM CHLORIDE CRYS ER 20 MEQ PO TBCR
40.0000 meq | EXTENDED_RELEASE_TABLET | Freq: Every day | ORAL | Status: AC
  Administered 2022-08-17 – 2022-08-19 (×3): 40 meq via ORAL
  Filled 2022-08-17 (×3): qty 2

## 2022-08-17 MED ORDER — LOPERAMIDE HCL 2 MG PO CAPS: 2.0000 mg | ORAL_CAPSULE | Freq: Every day | ORAL | Status: DC

## 2022-08-17 MED ORDER — METOPROLOL TARTRATE 12.5 MG HALF TABLET: 12.5000 mg | ORAL_TABLET | Freq: Two times a day (BID) | ORAL | Status: DC | PRN

## 2022-08-17 MED ORDER — FERROUS SULFATE 325 (65 FE) MG PO TABS
325.0000 mg | ORAL_TABLET | Freq: Two times a day (BID) | ORAL | Status: DC
  Administered 2022-08-17 – 2022-08-19 (×4): 325 mg via ORAL
  Filled 2022-08-17 (×5): qty 1

## 2022-08-17 MED ORDER — LOPERAMIDE HCL 2 MG PO CAPS
2.0000 mg | ORAL_CAPSULE | Freq: Two times a day (BID) | ORAL | Status: DC
  Administered 2022-08-17 (×2): 2 mg via ORAL
  Filled 2022-08-17 (×2): qty 1

## 2022-08-17 MED ORDER — LOPERAMIDE HCL 2 MG PO CAPS: 2.0000 mg | ORAL_CAPSULE | Freq: Four times a day (QID) | ORAL | Status: DC | PRN

## 2022-08-17 NOTE — Progress Notes (Signed)
Alexandria [name] [id number] 09-14-1945  CARE TEAM:  PCP: Donato Schultz, DO  Outpatient Care Team: Patient Care Team: Zola Button, Grayling Congress, DO as PCP - General (Family Medicine) Thomasene Ripple, DO as PCP - Cardiology (Cardiology) Freddy Finner, MD (Inactive) as Consulting Physician (Obstetrics and Gynecology) Thomasene Ripple, DO as Consulting Physician (Cardiology) Imogene Burn, MD as Consulting Physician (Gastroenterology) Karie Soda, MD as Consulting Physician (General Surgery)  Inpatient Treatment Team: Treatment Team: Attending Provider: Karie Soda, MD; WOC Nurse: Link Snuffer, RN; Case Manager: Redmond Baseman, RN; Registered Nurse: Josph Macho, RN; Licensed Practical Nurse: Jamesetta Orleans, LPN   Problem List:   Principal Problem:   Diverticulitis of colon Active Problems:   Ileostomy in place [facility])   Hypothyroidism   Fatigue   Degeneration of intervertebral disc of cervical region   Hx of myocardial infarction   Situational anxiety   ILD (interstitial lung disease) (HCC)   IPF (idiopathic pulmonary fibrosis) (HCC)   Pelvic abscess in female   Blowout of rectal stump (HCC)   4 Days Post-Op  08/13/2022  POST-OPERATIVE DIAGNOSIS:   -COLOSTOMY FOR COLON RESECTION. DESIRE FOR OSTOMY TAKEDOWN -CHRONIC RECTAL STUMP DEHISCENCE -PELVIC ABSCESS -HISTORY OF DIVERTICULITIS WITH STRICTURE & ABSCESS S/P HARTMANN COLECTOMY/COLOSTOMY   PROCEDURE:   -ROBOTIC COLOSTOMY TAKEDOWN WITH LOW COLORECTAL ANASTOMOSIS  -DIVERTING LOOP ILEOSTOMY -MOBILIZATION OF SPLENIC FLEXURE OF COLON -SMALL BOWEL RESECTION X 2 -LYSIS OF ADHESIONS X 2.5 HOURS (60% CASE) -FLEXIBLE SIGMOIDOSCOPY -INTRAOPERATIVE ASSESSMENT OF TISSUE VASCULAR PERFUSION USING ICG (indocyanine green) IMMUNOFLUORESCENCE -TRANSVERSUS ABDOMINIS PLANE (TAP) BLOCK - BILATERAL   SURGEON:  Ardeth Sportsman, MD   OR FINDINGS:  Very dense adhesions of especially small bowel to the pelvis with ileal  serosa acting as a patch over a chronic rectal stump dehiscence.  Small abscess nearby.  Ileal small bowel resection x 2 needed.  Rectal stump foreshortened and retracted.   Splenic flexure mobilization and high ligation of IMA/IMV pedicle needed for the colostomy to reach down in the very low pelvis.  ICG firefly done to confirm good perfusion.   It is a 29 EEA (mid descending to distal rectal) anastomosis.  At rest 5-6 cm from the anal verge.  Because of a low rectal anastomosis with abscess nearby and poor tissues, temporary diverting loop ileostomy done.      Assessment  Stabilizing  [facility] Stay = 4 days)  Plan:  -ERAS protocol.  Advance to solid diet.  Hypokalemia.  Replace orally as well as IV and follow.  Double check magnesium.  She notes her potassium tends to be low already.  May need to send her out with that for a while.  IV antibiotics x 5 days postop given small abscess in pelvis to minimize risk of recurrent infection which she has had numerous times in the past -should complete 5/14  Ileostomy care.  Noted the reasoning for the need for temporary diverting loop ileostomy to protect her very low fragile anastomosis.  Will have to help follow and make sure that she is not struggling with high output ileostomy diarrhea issues.  Add iron and Imodium scheduled with as needed breakthrough.  Ask wound ostomy team to help follow her.  She has been plugged into the ostomy clinic already for her chronic end colostomy.  Interstitial lung disease.  Stable on room air.  Follow-up.  Chronic hypothyroidism.  Resume Synthroid.  VTE prophylaxis- SCDs, etc  mobilize as tolerated to help recovery  Disposition:  Disposition:  The patient is from: Home  Anticipate discharge to:  Home with Home Health  Anticipated Date of Discharge is:  May 14,2024   Barriers to discharge:  Consultant clearance & sign off  , Therapy assessment & Recommendations pending, and Pending Clinical  improvement (more likely than not)  Patient currently is NOT MEDICALLY STABLE for discharge from the hospital from a surgery standpoint.      I reviewed nursing notes, last 24 h vitals and pain scores, last 48 h intake and output, last 24 h labs and trends, and last 24 h imaging results. I have reviewed this patient's available data, including medical history, events of note, test results, etc as part of my evaluation.  A significant portion of that time was spent in counseling.  Care during the described time interval was provided by me.  This care required moderate level of medical decision making.  08/17/2022    Subjective: (Chief complaint)  Bulb not holding suction.  Having some drainage.  Bulb had been partially pulled out.  I pulled out the light remaining few inches.  Removed the stitch.  NPO.  Not nauseated.  Pain better controlled.  Walking more.  Not lightheaded or dizzy.  Emptying ileostomy bag about every 2 hours.   Objective:  Vital signs:  Vitals:   08/16/22 0558 08/16/22 1156 08/16/22 1953 08/17/22 0536  BP: (!) 126/52 (!) 146/71 (!) 156/63 (!) 157/61  Pulse: (!) 51 (!) 53 (!) 52 81  Resp: 18 18 18 18   Temp: 97.7 F (36.5 C) 97.7 F (36.5 C) 97.9 F (36.6 C) 97.6 F (36.4 C)  TempSrc: Oral Oral Oral Oral  SpO2: 95% 97% 97% 92%  Weight:      Height:        Last BM Date : 08/16/22  Intake/Output   Yesterday:  05/12 0701 - 05/13 0700 In: 1677.9 [P.O.:1500; IV Piggyback:177.9] Out: 2925 [Urine:1650; Drains:150; Stool:1125] This shift:  No intake/output data recorded.  Bowel function:  Flatus: YES  BM:  YES  Drain: Serosanguinous -in output.  Drain partially filled out and will not hold suction.  I removed it.   Physical Exam:  General: Pt awake/alert in no acute distress Eyes: PERRL, normal EOM.  Sclera clear.  No icterus Neuro: CN II-XII intact w/o focal sensory/motor deficits. Lymph: No head/neck/groin lymphadenopathy Psych:  No  delerium/psychosis/paranoia.  Oriented x 4 HENT: Normocephalic, Mucus membranes moist.  No thrush Neck: Supple, No tracheal deviation.  No obvious thyromegaly Chest: No pain to chest wall compression.  Good respiratory excursion.  No audible wheezing CV:  Pulses intact.  Regular rhythm.  No major extremity edema MS: Normal AROM mjr joints.  No obvious deformity  Abdomen: Soft.  Nondistended.  Mildly tender at incisions only.  No evidence of peritonitis.  No incarcerated hernias. Loop ileostomy left lower quadrant pink with mostly thin with some oatmeal consistency effluent brown.  Drain partially pulled out.  I removed  Ext:   No deformity.  No mjr edema.  No cyanosis Skin: No petechiae / purpurea.  No major sores.  Warm and dry    Results:   Cultures: No results found for this or any previous visit (from the past 720 hour(s)).  Labs: Results for orders placed or performed during the hospital encounter of 08/13/22 (from the past 48 hour(s))  CBC     Status: Abnormal   Collection Time: 08/16/22  4:45 AM  Result Value Ref Range   WBC 7.0 4.0 - 10.5  K/uL   RBC 3.10 (L) 3.87 - 5.11 MIL/uL   Hemoglobin 10.3 (L) 12.0 - 15.0 g/dL   HCT 16.1 (L) 09.6 - 04.5 %   MCV 101.3 (H) 80.0 - 100.0 fL   MCH 33.2 26.0 - 34.0 pg   MCHC 32.8 30.0 - 36.0 g/dL   RDW 40.9 81.1 - 91.4 %   Platelets 199 150 - 400 K/uL   nRBC 0.0 0.0 - 0.2 %    Comment: Performed at [facility], [address] [place], [place] [address]  Potassium     Status: Abnormal   Collection Time: 08/16/22  4:45 AM  Result Value Ref Range   Potassium 3.4 (L) 3.5 - 5.1 mmol/L    Comment: Performed at [facility], [address], [place], [place] [address]  Creatinine, serum     Status: None   Collection Time: 08/16/22  4:45 AM  Result Value Ref Range   Creatinine, Ser 0.67 0.44 - 1.00 mg/dL   GFR, Estimated >86 >57 mL/min    Comment: (NOTE) Calculated using the CKD-EPI Creatinine  Equation (2021) Performed at [facility], [address], [place], [place] [address]   CBC     Status: Abnormal   Collection Time: 08/17/22  4:24 AM  Result Value Ref Range   WBC 6.0 4.0 - 10.5 K/uL   RBC 3.33 (L) 3.87 - 5.11 MIL/uL   Hemoglobin 10.8 (L) 12.0 - 15.0 g/dL   HCT 29.5 (L) 28.4 - 13.2 %   MCV 99.7 80.0 - 100.0 fL   MCH 32.4 26.0 - 34.0 pg   MCHC 32.5 30.0 - 36.0 g/dL   RDW 44.0 10.2 - 72.5 %   Platelets 225 150 - 400 K/uL   nRBC 0.0 0.0 - 0.2 %    Comment: Performed at [facility], [address], [place], [place] [address]  Potassium     Status: Abnormal   Collection Time: 08/17/22  4:24 AM  Result Value Ref Range   Potassium 3.1 (L) 3.5 - 5.1 mmol/L    Comment: Performed at [facility] [address], [place], [place] [address]  Creatinine, serum     Status: None   Collection Time: 08/17/22  4:24 AM  Result Value Ref Range   Creatinine, Ser 0.70 0.44 - 1.00 mg/dL   GFR, Estimated >25 >95 mL/min    Comment: (NOTE) Calculated using the CKD-EPI Creatinine Equation (2021) Performed at [facility], [address], [place], [place] [address]    *Note: Due to a large number of results and/or encounters for the requested time period, some results have not been displayed. A complete set of results can be found in Results Review.    Imaging / Studies: No results found.  Medications / Allergies: per chart  Antibiotics: Anti-infectives (From admission, onward)    Start     Dose/Rate Route Frequency Ordered Stop   08/14/22 1000  piperacillin-tazobactam (ZOSYN) IVPB 3.375 g        3.375 g 12.5 mL/hr over 240 Minutes Intravenous Every 8 hours 08/14/22 0743 08/19/22 0959   08/13/22 2200  cefoTEtan (CEFOTAN) 2 g in sodium chloride 0.9 % 100 mL IVPB        2 g 200 mL/hr over 30 Minutes Intravenous Every 12 hours 08/13/22 1836 08/13/22 2335   08/13/22 1400  neomycin (MYCIFRADIN) tablet  1,000 mg  Status:  Discontinued       See Hyperspace  for full Linked Orders Report.   1,000 mg Oral 3 times per day 08/13/22 0814 08/13/22 0816   08/13/22 1400  metroNIDAZOLE (FLAGYL) tablet 1,000 mg  Status:  Discontinued       See Hyperspace for full Linked Orders Report.   1,000 mg Oral 3 times per day 08/13/22 0814 08/13/22 0816   08/13/22 0815  cefoTEtan (CEFOTAN) 2 g in sodium chloride 0.9 % 100 mL IVPB        2 g 200 mL/hr over 30 Minutes Intravenous On call to O.R. 08/13/22 0814 08/13/22 1325         Note: Portions of this report may have been transcribed using voice recognition software. Every effort was made to ensure accuracy; however, inadvertent computerized transcription errors may be present.   Any transcriptional errors that result from this process are unintentional.    Ardeth Sportsman, MD, FACS, MASCRS Esophageal, Gastrointestinal & Colorectal Surgery Robotic and Minimally Invasive Surgery  [facility] [address] [place], [place] [address] [phone] Fax [phone] Main  CONTACT INFORMATION:  Weekday (9AM-5PM): Call [facility] main office at [phone]  Weeknight (5PM-9AM) or Weekend/Holiday: Check [email or web address] (password " [email or web address]") for General Surgery [facility] coverage  (Please, do not use SecureChat as it is not reliable communication to reach operating surgeons for immediate patient care given surgeries/outpatient duties/clinic/cross-coverage/off post-call which would lead to a delay in care.  Epic staff messaging available for outptient concerns, but may not be answered for 48 hours or more).     08/17/2022  7:30 AM

## 2022-08-17 NOTE — Consult Note (Addendum)
WOC Nurse ostomy follow up Pt is familiar with ostomy care; she had a colostomy prior to recent ileostomy revision. She states she is unable to visualize her stoma and goes to the ostomy outpatient clinic twice a week for assistance with pouch application. She is independent with emptying. Stoma is red and viable, 1 3/4 inches, slightly above skin level.  Peristomal assessment: intact Output: 75cc liquid brown stool Ostomy pouching: 1pc convex with barrier ring Education provided: Discussed differences in ileostomy output and importance in avoiding dehydration, dietary precautions, and frequency of emptying. Applied barrier ring and one piece convex pouch,  Use supplies: barrier ring, Lawson # [id number] pouch Lawson # [phone]. 5 sets of each supply left at the bedside, along with educational materials. Pt asked appropriate questions and plans to continue follow-up at the outpatient ostomy clinic after discharge.  Enrolled patient in [facility] Secure Start Discharge program: Yes, previously Thank-you,  Cammie Mcgee MSN, RN, CWOCN, [place], CNS [phone]

## 2022-08-17 NOTE — Care Management Important Message (Signed)
Important Message  Patient Details IM Letter given. Name: Alexandria [name] MRN: [id number] Date of Birth: 09/08/1945   Medicare Important Message Given:  Yes     Caren Macadam 08/17/2022, 12:28 PM

## 2022-08-17 NOTE — Progress Notes (Signed)
Mobility Specialist - Progress Note   08/17/22 1403  Mobility  Activity Ambulated with assistance in hallway  Level of Assistance Standby assist, set-up cues, supervision of patient - no hands on  Assistive Device Front wheel walker  Distance Ambulated (ft) 100 ft  Range of Motion/Exercises Active  Activity Response Tolerated well  Mobility Referral Yes  $Mobility charge 1 Mobility  Mobility Specialist Start Time (ACUTE ONLY) 1355  Mobility Specialist Stop Time (ACUTE ONLY) 1403  Mobility Specialist Time Calculation (min) (ACUTE ONLY) 8 min   Pt received in bed and agreed to mobility, standby for entire session with some pain in both LE, pt returned to bed with all needs met.  Marilynne Halsted Mobility Specialist

## 2022-08-17 NOTE — Progress Notes (Signed)
Pt's JP bulb is not charging to suction. Bulb is replaced however it did not work. Notified MD. Will endorse.

## 2022-08-18 LAB — CBC
HCT: 35.5 % — ABNORMAL LOW (ref 36.0–46.0)
Hemoglobin: 11.2 g/dL — ABNORMAL LOW (ref 12.0–15.0)
MCH: 31.9 pg (ref 26.0–34.0)
MCHC: 31.5 g/dL (ref 30.0–36.0)
MCV: 101.1 fL — ABNORMAL HIGH (ref 80.0–100.0)
Platelets: 255 10*3/uL (ref 150–400)
RBC: 3.51 MIL/uL — ABNORMAL LOW (ref 3.87–5.11)
RDW: 15 % (ref 11.5–15.5)
WBC: 6.4 10*3/uL (ref 4.0–10.5)
nRBC: 0 % (ref 0.0–0.2)

## 2022-08-18 LAB — BASIC METABOLIC PANEL
Anion gap: 9 (ref 5–15)
BUN: 6 mg/dL — ABNORMAL LOW (ref 8–23)
CO2: 23 mmol/L (ref 22–32)
Calcium: 8.2 mg/dL — ABNORMAL LOW (ref 8.9–10.3)
Chloride: 104 mmol/L (ref 98–111)
Creatinine, Ser: 0.63 mg/dL (ref 0.44–1.00)
GFR, Estimated: >60 mL/min (ref >60)
Glucose, Bld: 90 mg/dL (ref 70–99)
Potassium: 3.8 mmol/L (ref 3.5–5.1)
Sodium: 136 mmol/L (ref 135–145)

## 2022-08-18 LAB — MAGNESIUM: Magnesium: 1.9 mg/dL (ref 1.7–2.4)

## 2022-08-18 MED ORDER — METHOCARBAMOL 500 MG PO TABS
500.0000 mg | ORAL_TABLET | Freq: Four times a day (QID) | ORAL | Status: DC
  Administered 2022-08-18 – 2022-08-19 (×4): 500 mg via ORAL
  Filled 2022-08-18 (×4): qty 1

## 2022-08-18 MED ORDER — TRAMADOL HCL 50 MG PO TABS
50.0000 mg | ORAL_TABLET | Freq: Every evening | ORAL | Status: DC | PRN
  Administered 2022-08-18 (×2): 50 mg via ORAL
  Filled 2022-08-18: qty 1

## 2022-08-18 MED ORDER — LOPERAMIDE HCL 2 MG PO CAPS: 2.0000 mg | ORAL_CAPSULE | Freq: Four times a day (QID) | ORAL | Status: DC | PRN

## 2022-08-18 MED ORDER — NINTEDANIB ESYLATE 100 MG PO CAPS: 100.0000 mg | ORAL_CAPSULE | Freq: Every day | ORAL | Status: DC

## 2022-08-18 MED ORDER — TRAMADOL HCL 50 MG PO TABS
50.0000 mg | ORAL_TABLET | Freq: Four times a day (QID) | ORAL | Status: DC | PRN
  Administered 2022-08-18 – 2022-08-19 (×2): 50 mg via ORAL
  Filled 2022-08-18 (×2): qty 1

## 2022-08-18 MED ORDER — LACTATED RINGERS IV BOLUS
1000.0000 mL | Freq: Once | INTRAVENOUS | Status: AC
  Administered 2022-08-18: 1000 mL via INTRAVENOUS

## 2022-08-18 MED ORDER — LACTATED RINGERS IV BOLUS: 1000.0000 mL | Freq: Three times a day (TID) | INTRAVENOUS | Status: DC | PRN

## 2022-08-18 MED ORDER — LOPERAMIDE HCL 2 MG PO CAPS
4.0000 mg | ORAL_CAPSULE | Freq: Two times a day (BID) | ORAL | Status: DC
  Administered 2022-08-18 – 2022-08-19 (×3): 4 mg via ORAL
  Filled 2022-08-18 (×3): qty 2

## 2022-08-18 MED ORDER — TRAMADOL HCL 50 MG PO TABS
100.0000 mg | ORAL_TABLET | Freq: Four times a day (QID) | ORAL | Status: DC | PRN
Start: 2022-08-18 — End: 2022-08-18

## 2022-08-18 MED ORDER — SALINE SPRAY 0.65 % NA SOLN: 1.0000 | NASAL | Status: DC | PRN

## 2022-08-18 NOTE — Progress Notes (Signed)
Mobility Specialist - Progress Note   08/18/22 1455  Mobility  Activity Ambulated with assistance in hallway  Level of Assistance Standby assist, set-up cues, supervision of patient - no hands on  Assistive Device Front wheel walker  Distance Ambulated (ft) 80 ft  Activity Response Tolerated well  Mobility Referral Yes  $Mobility charge 1 Mobility  Mobility Specialist Start Time (ACUTE ONLY) 0243  Mobility Specialist Stop Time (ACUTE ONLY) 0253  Mobility Specialist Time Calculation (min) (ACUTE ONLY) 10 min   Pt received in bed and agreeable to mobility. C/o stomach hurting near incisions. Nurse aware. No other complaints during session. Pt to bed after session with all needs met.    [facility]

## 2022-08-18 NOTE — Progress Notes (Addendum)
Alexandria [name] [id number] February 09, 1946  CARE TEAM:  PCP: Donato Schultz, DO  Outpatient Care Team: Patient Care Team: Zola Button, Grayling Congress, DO as PCP - General (Family Medicine) Thomasene Ripple, DO as PCP - Cardiology (Cardiology) Freddy Finner, MD (Inactive) as Consulting Physician (Obstetrics and Gynecology) Thomasene Ripple, DO as Consulting Physician (Cardiology) Imogene Burn, MD as Consulting Physician (Gastroenterology) Karie Soda, MD as Consulting Physician (General Surgery)  Inpatient Treatment Team: Treatment Team: Attending Provider: Karie Soda, MD; WOC Nurse: McNichol, Bonney Aid, RN; Mobility Specialist: Lorina Rabon; Registered Nurse: Shiela Mayer, RN; Registered Nurse: Sabino Snipes, RN; Respiratory Therapist: Nunzio Cobbs, RRT; Technician: Christell Constant, NT; Pharmacist: Cindi Carbon, [facility]   Problem List:   Principal Problem:   Diverticulitis of colon Active Problems:   Ileostomy in place [facility])   Hypothyroidism   Fatigue   Degeneration of intervertebral disc of cervical region   Hx of myocardial infarction   Situational anxiety   ILD (interstitial lung disease) (HCC)   IPF (idiopathic pulmonary fibrosis) (HCC)   Pelvic abscess in female   Blowout of rectal stump (HCC)   5 Days Post-Op  08/13/2022  POST-OPERATIVE DIAGNOSIS:   -COLOSTOMY FOR COLON RESECTION. DESIRE FOR OSTOMY TAKEDOWN -CHRONIC RECTAL STUMP DEHISCENCE -PELVIC ABSCESS -HISTORY OF DIVERTICULITIS WITH STRICTURE & ABSCESS S/P HARTMANN COLECTOMY/COLOSTOMY   PROCEDURE:   -ROBOTIC COLOSTOMY TAKEDOWN WITH LOW COLORECTAL ANASTOMOSIS  -DIVERTING LOOP ILEOSTOMY -MOBILIZATION OF SPLENIC FLEXURE OF COLON -SMALL BOWEL RESECTION X 2 -LYSIS OF ADHESIONS X 2.5 HOURS (60% CASE) -FLEXIBLE SIGMOIDOSCOPY -INTRAOPERATIVE ASSESSMENT OF TISSUE VASCULAR PERFUSION USING ICG (indocyanine green) IMMUNOFLUORESCENCE -TRANSVERSUS ABDOMINIS PLANE (TAP) BLOCK - BILATERAL   SURGEON:   Ardeth Sportsman, MD   OR FINDINGS:  Very dense adhesions of especially small bowel to the pelvis with ileal serosa acting as a patch over a chronic rectal stump dehiscence.  Small abscess nearby.  Ileal small bowel resection x 2 needed.  Rectal stump foreshortened and retracted.   Splenic flexure mobilization and high ligation of IMA/IMV pedicle needed for the colostomy to reach down in the very low pelvis.  ICG firefly done to confirm good perfusion.   It is a 29 EEA (mid descending to distal rectal) anastomosis.  At rest 5-6 cm from the anal verge.  Because of a low rectal anastomosis with abscess nearby and poor tissues, temporary diverting loop ileostomy done.      Assessment  Stabilizing  [facility] Stay = 5 days)  Plan:  ERAS protocol.  Solid diet.  Improve pain control.  Continue standing Tylenol and heat.  Add low-dose scheduled Robaxin.  Add tramadol 1 dose at night to help her sleep better.  Challenging the patient has a lot of issues with opiates.  Negative and I's and O's.  Give IV fluid bolus and follow.  Hypokalemia.  Replaced IV and continuing daily oral supplementation.  Recheck labs  She notes her potassium tends to be low already.  Dissipate supplementation daily for a while most likely check labs a week postop to make sure she is okay.    IV antibiotics x 5 days postop given small abscess in pelvis to minimize risk of recurrent infection which she has had numerous times in the past -should complete 5/15.  Check CBC.  If worsening discomfort or elevated white count or complaints of worsening drainage, repeat CAT scan to rule out delayed abscess.  Hopefully less likely now that she is not severely malnourished and did  not have a giant abscess this time.  Ileostomy care.  Noted the reasoning for the need for temporary diverting loop ileostomy to protect her very low fragile anastomosis.  Ask wound ostomy team to help follow her.  She has been plugged into the ostomy  clinic already for her chronic end colostomy.  Will have to help follow and make sure that she is not struggling with high output ileostomy diarrhea issues.  Add iron and Imodium scheduled with as needed breakthrough.  Interstitial lung disease.  Resume daily Nintedanib.  Stable on room air.  Follow-up.  Chronic hypothyroidism.  Daily Synthroid.  VTE prophylaxis- SCDs, enoxaparin, etc  Mobilize as tolerated to help recovery.  Not very motivated.  Double check orthostatics.  Have PT and OT evaluate to make sure they do not feel she needs any extra support with home health.  I believe she already has a rolling walker but double check.  Disposition:  Disposition:  The patient is from: Home  Anticipate discharge to:  Home with Home Health  Anticipated Date of Discharge is:  May 15,2024   Barriers to discharge:  Consultant clearance & sign off  , Therapy assessment & Recommendations pending, and Pending Clinical improvement (more likely than not)  Patient currently is NOT MEDICALLY STABLE for discharge from the hospital from a surgery standpoint.      I reviewed nursing notes, last 24 h vitals and pain scores, last 48 h intake and output, last 24 h labs and trends, and last 24 h imaging results. I have reviewed this patient's available data, including medical history, events of note, test results, etc as part of my evaluation.  A significant portion of that time was spent in counseling.  Care during the described time interval was provided by me.  This care required moderate level of medical decision making.  08/18/2022    Subjective: (Chief complaint)  Patient complaining she again had a "rough night".  Pain not well-controlled yesterday but apparently got a medication this morning that works better.  Looks like she got acetaminophen.  Not very motivated to understand her medications or ambulate but did walk in hallways with help on a walker.  Denies any lightheadedness or  dizziness.  No nausea or vomiting.  To drink liquids.   Objective:  Vital signs:  Vitals:   08/17/22 0536 08/17/22 1334 08/17/22 2038 08/18/22 0602  BP: (!) 157/61 (!) 160/87 (!) 174/85 (!) 179/69  Pulse: 81 72 (!) 54 (!) 54  Resp: 18 14 18 18   Temp: 97.6 F (36.4 C) 98 F (36.7 C) 98.4 F (36.9 C) 98 F (36.7 C)  TempSrc: Oral Oral Oral Oral  SpO2: 92% 97% 95% 95%  Weight:      Height:        Last BM Date : 08/17/22  Intake/Output   Yesterday:  05/13 0701 - 05/14 0700 In: 1570.1 [P.O.:1260; IV Piggyback:310.1] Out: 3400 [Urine:2200; Stool:1200] This shift:  No intake/output data recorded.  Bowel function:  Flatus: YES  BM:  YES  Drain: (No drain)   Physical Exam:  General: Pt awake/alert in no acute distress.  Tired but not toxic/sickly. Eyes: PERRL, normal EOM.  Sclera clear.  No icterus Neuro: CN II-XII intact w/o focal sensory/motor deficits. Lymph: No head/neck/groin lymphadenopathy Psych:  No delerium/psychosis/paranoia.  Oriented x 4.  Somewhat stoic.  Seems somewhat depressed. HENT: Normocephalic, Mucus membranes moist.  No thrush Neck: Supple, No tracheal deviation.  No obvious thyromegaly Chest: No pain to chest wall  compression.  Good respiratory excursion.  No audible wheezing CV:  Pulses intact.  Regular rhythm.  No major extremity edema MS: Normal AROM mjr joints.  No obvious deformity  Abdomen: Soft.  Nondistended.  Mildly tender at incisions only.  No evidence of peritonitis.  No incarcerated hernias.  Ext:   No deformity.  No mjr edema.  No cyanosis Skin: No petechiae / purpurea.  No major sores.  Warm and dry    Results:   Cultures: No results found for this or any previous visit (from the past 720 hour(s)).  Labs: Results for orders placed or performed during the hospital encounter of 08/13/22 (from the past 48 hour(s))  CBC     Status: Abnormal   Collection Time: 08/17/22  4:24 AM  Result Value Ref Range   WBC 6.0 4.0 - 10.5  K/uL   RBC 3.33 (L) 3.87 - 5.11 MIL/uL   Hemoglobin 10.8 (L) 12.0 - 15.0 g/dL   HCT 40.9 (L) 81.1 - 91.4 %   MCV 99.7 80.0 - 100.0 fL   MCH 32.4 26.0 - 34.0 pg   MCHC 32.5 30.0 - 36.0 g/dL   RDW 78.2 95.6 - 21.3 %   Platelets 225 150 - 400 K/uL   nRBC 0.0 0.0 - 0.2 %    Comment: Performed at [facility], [address] [place], [place] [address]  Potassium     Status: Abnormal   Collection Time: 08/17/22  4:24 AM  Result Value Ref Range   Potassium 3.1 (L) 3.5 - 5.1 mmol/L    Comment: Performed at [facility], [address] [place], [place] [address]  Creatinine, serum     Status: None   Collection Time: 08/17/22  4:24 AM  Result Value Ref Range   Creatinine, Ser 0.70 0.44 - 1.00 mg/dL   GFR, Estimated >29 >52 mL/min    Comment: (NOTE) Calculated using the CKD-EPI Creatinine Equation (2021) Performed at [facility], [address], [place], [place] [address]   Magnesium     Status: None   Collection Time: 08/17/22  4:24 AM  Result Value Ref Range   Magnesium 1.9 1.7 - 2.4 mg/dL    Comment: Performed at [facility], [address] [place], [place] [address]   *Note: Due to a large number of results and/or encounters for the requested time period, some results have not been displayed. A complete set of results can be found in Results Review.    Imaging / Studies: No results found.  Medications / Allergies: per chart  Antibiotics: Anti-infectives (From admission, onward)    Start     Dose/Rate Route Frequency Ordered Stop   08/14/22 1000  piperacillin-tazobactam (ZOSYN) IVPB 3.375 g        3.375 g 12.5 mL/hr over 240 Minutes Intravenous Every 8 hours 08/14/22 0743 08/19/22 0959   08/13/22 2200  cefoTEtan (CEFOTAN) 2 g in sodium chloride 0.9 % 100 mL IVPB        2 g 200 mL/hr over 30 Minutes Intravenous Every 12 hours 08/13/22 1836 08/13/22 2335   08/13/22 1400  neomycin (MYCIFRADIN)  tablet 1,000 mg  Status:  Discontinued       See Hyperspace for full Linked Orders Report.   1,000 mg Oral 3 times per day 08/13/22 0814 08/13/22 0816   08/13/22 1400  metroNIDAZOLE (FLAGYL) tablet 1,000 mg  Status:  Discontinued       See Hyperspace for full Linked  Orders Report.   1,000 mg Oral 3 times per day 08/13/22 0814 08/13/22 0816   08/13/22 0815  cefoTEtan (CEFOTAN) 2 g in sodium chloride 0.9 % 100 mL IVPB        2 g 200 mL/hr over 30 Minutes Intravenous On call to O.R. 08/13/22 0814 08/13/22 1325         Note: Portions of this report may have been transcribed using voice recognition software. Every effort was made to ensure accuracy; however, inadvertent computerized transcription errors may be present.   Any transcriptional errors that result from this process are unintentional.    Ardeth Sportsman, MD, FACS, MASCRS Esophageal, Gastrointestinal & Colorectal Surgery Robotic and Minimally Invasive Surgery  [facility] [address], [address] [place], [place] [address] [phone] Fax [phone] Main  CONTACT INFORMATION:  Weekday (9AM-5PM): Call [facility] main office at [phone]  Weeknight (5PM-9AM) or Weekend/Holiday: Check [email or web address] (password " [email or web address]") for General Surgery [facility] coverage  (Please, do not use SecureChat as it is not reliable communication to reach operating surgeons for immediate patient care given surgeries/outpatient duties/clinic/cross-coverage/off post-call which would lead to a delay in care.  Epic staff messaging available for outptient concerns, but may not be answered for 48 hours or more).     08/18/2022  7:52 AM

## 2022-08-18 NOTE — Evaluation (Signed)
Occupational Therapy Evaluation Patient Details Name: Alexandria [name] MRN: [id number] DOB: 1946/03/19 Today's Date: 08/18/2022   History of Present Illness 77 yo female admitted with diverticulitis, hypokalemia. S/P S resection, colostomy takedown, diverting loop ileostomy 08/13/22. Hx of hypothyroidism, DVT, chronic hypokalemia   Clinical Impression   Pt is at min A level with LB ADLs due to abdominal pain/discomfort and Sup with mobility using RW. Pt is retired and will have assist from husband as needed for LB selfcare and meals. All education completed and no further acute OT services are indicated at this time.     Recommendations for follow up therapy are one component of a multi-disciplinary discharge planning process, led by the attending physician.  Recommendations may be updated based on patient status, additional functional criteria and insurance authorization.   Assistance Recommended at Discharge Set up Supervision/Assistance  Patient can return home with the following A little help with bathing/dressing/bathroom;A little help with walking and/or transfers;Assistance with cooking/housework;Help with stairs or ramp for entrance;Assist for transportation    Functional Status Assessment  Patient has had a recent decline in their functional status and demonstrates the ability to make significant improvements in function in a reasonable and predictable amount of time.  Equipment Recommendations  None recommended by OT    Recommendations for Other Services       Precautions / Restrictions Precautions Precaution Comments: ileostomy Restrictions Weight Bearing Restrictions: No      Mobility Bed Mobility Overal bed mobility: Modified Independent                  Transfers Overall transfer level: Modified independent                        Balance Overall balance assessment: Mild deficits observed, not formally tested                                          ADL either performed or assessed with clinical judgement   ADL Overall ADL's : Independent                                       General ADL Comments: min A with LB bathing and dressing, limited by abdominal pain otherwise would most lkely not need any assist. Pt has had abdomoanlsurgeyr in the past and stated that she could do her rown LB selfcare or would just wear gowns if too diffifuclt nitl pain subsides     Vision Ability to See in Adequate Light: 0 Adequate Patient Visual Report: No change from baseline       Perception     Praxis      Pertinent Vitals/Pain Pain Assessment Pain Assessment: 0-10 Pain Score: 6  Pain Location: abdomen Pain Descriptors / Indicators: Discomfort, Sore Pain Intervention(s): Monitored during session, Repositioned     Hand Dominance Right   Extremity/Trunk Assessment Upper Extremity Assessment Upper Extremity Assessment: Overall WFL for tasks assessed   Lower Extremity Assessment Lower Extremity Assessment: Defer to PT evaluation   Cervical / Trunk Assessment Cervical / Trunk Assessment: Normal   Communication Communication Communication: No difficulties   Cognition Arousal/Alertness: Awake/alert Behavior During Therapy: WFL for tasks assessed/performed Overall Cognitive Status: Within Functional Limits for tasks assessed  General Comments       Exercises     Shoulder Instructions      Home Living Family/patient expects to be discharged to:: Private residence Living Arrangements: Spouse/significant other Available Help at Discharge: Family;Available 24 hours/day Type of Home: House Home Access: Stairs to enter [employer] of Steps: 1 small step Entrance Stairs-Rails: None   Alternate Level Stairs-Number of Steps: flight       Bathroom Toilet: Handicapped height     Home Equipment: Rollator (4 wheels);Cane -  quad;Shower seat   Additional Comments: spouse has some signs of dementia per pt but he is still able to drive      Prior Functioning/Environment Prior Level of Function : Independent/Modified Independent             Mobility Comments: walks without AD ADLs Comments: independent, cooks, drives        OT Problem List: Decreased activity tolerance;Pain      OT Treatment/Interventions:      OT Goals(Current goals can be found in the care plan section) Acute Rehab OT Goals Patient Stated Goal: go home  OT Frequency:      Co-evaluation              AM-PAC OT "6 Clicks" Daily Activity     Outcome Measure Help from another person eating meals?: None Help from another person taking care of personal grooming?: None Help from another person toileting, which includes using toliet, bedpan, or urinal?: None Help from another person bathing (including washing, rinsing, drying)?: A Little Help from another person to put on and taking off regular upper body clothing?: None Help from another person to put on and taking off regular lower body clothing?: A Little 6 Click Score: 22   End of Session Equipment Utilized During Treatment: Rolling walker (2 wheels)  Activity Tolerance: Patient tolerated treatment well;Patient limited by pain Patient left: in bed  OT Visit Diagnosis: Pain Pain - part of body:  (abdomen)                Time: 1341-1406 OT Time Calculation (min): 25 min Charges:  OT General Charges $OT Visit: 1 Visit OT Evaluation $OT Eval Moderate Complexity: 1 Mod OT Treatments $Therapeutic Activity: 8-22 mins    Galen Manila 08/18/2022, 3:02 PM

## 2022-08-18 NOTE — Evaluation (Signed)
Physical Therapy Evaluation Patient Details Name: Alexandria [name] MRN: [id number] DOB: Jun 11, 1945 Today's Date: 08/18/2022  History of Present Illness  77 yo female admitted with diverticulitis, hypokalemia. S/P S resection, colostomy takedown, diverting loop ileostomy 08/13/22. Hx of hypothyroidism, DVT, chronic hypokalemia  Clinical Impression  On eval, pt was Supv level assist. She walked ~135 feet with a RW. She was also able to take a few steps in the room without the walker. Pt tolerated activity well. She denied dizziness during session. Will follow for now. Mobility team will likely be able to take over mobilizing patient.      Recommendations for follow up therapy are one component of a multi-disciplinary discharge planning process, led by the attending physician.  Recommendations may be updated based on patient status, additional functional criteria and insurance authorization.  Follow Up Recommendations       Assistance Recommended at Discharge PRN  Patient can return home with the following  Assist for transportation;Help with stairs or ramp for entrance;Assistance with cooking/housework    Equipment Recommendations None recommended by PT  Recommendations for Other Services       Functional Status Assessment Patient has had a recent decline in their functional status and demonstrates the ability to make significant improvements in function in a reasonable and predictable amount of time.     Precautions / Restrictions Precautions Precaution Comments: ileostomy Restrictions Weight Bearing Restrictions: No      Mobility  Bed Mobility Overal bed mobility: Modified Independent                  Transfers Overall transfer level: Modified independent                      Ambulation/Gait Ambulation/Gait assistance: Supervision Gait Distance (Feet): 135 Feet Assistive device: Rolling walker (2 wheels) Gait Pattern/deviations: Step-through pattern,  Decreased stride length       General Gait Details: tolerated distance well. denied dizziness. took a few steps in room without rw with lob.  Stairs            Wheelchair Mobility    Modified Rankin (Stroke Patients Only)       Balance Overall balance assessment: Mild deficits observed, not formally tested                                           Pertinent Vitals/Pain Pain Assessment Pain Assessment: Faces Faces Pain Scale: Hurts little more Pain Location: abdomen Pain Descriptors / Indicators: Discomfort, Sore Pain Intervention(s): Monitored during session    Home Living Family/patient expects to be discharged to:: Private residence Living Arrangements: Spouse/significant other Available Help at Discharge: Family;Available 24 hours/day Type of Home: House Home Access: Stairs to enter Entrance Stairs-Rails: None Entrance Stairs-Number of Steps: 1 small step Alternate Level Stairs-Number of Steps: flight Home Layout: Able to live on main level with bedroom/bathroom;Two level Home Equipment: Rollator (4 wheels);Cane - quad Additional Comments: spouse has some signs of dementia per pt but he is still able to drive    Prior Function Prior Level of Function : Independent/Modified Independent             Mobility Comments: walks without AD ADLs Comments: independent     Hand Dominance        Extremity/Trunk Assessment   Upper Extremity Assessment Upper Extremity Assessment: Defer to OT evaluation  Lower Extremity Assessment Lower Extremity Assessment: Overall WFL for tasks assessed    Cervical / Trunk Assessment Cervical / Trunk Assessment: Normal  Communication   Communication: No difficulties  Cognition Arousal/Alertness: Awake/alert Behavior During Therapy: WFL for tasks assessed/performed Overall Cognitive Status: Within Functional Limits for tasks assessed                                           General Comments      Exercises     Assessment/Plan    PT Assessment Patient needs continued PT services  PT Problem List Decreased balance;Decreased mobility;Decreased activity tolerance;Decreased knowledge of use of DME       PT Treatment Interventions DME instruction;Gait training;Balance training;Functional mobility training;Therapeutic activities;Patient/family education    PT Goals (Current goals can be found in the Care Plan section)  Acute Rehab PT Goals Patient Stated Goal: to eat! PT Goal Formulation: With patient Time For Goal Achievement: 09/01/22 Potential to Achieve Goals: Good    Frequency Min 1X/week     Co-evaluation               AM-PAC PT "6 Clicks" Mobility  Outcome Measure Help needed turning from your back to your side while in a flat bed without using bedrails?: None Help needed moving from lying on your back to sitting on the side of a flat bed without using bedrails?: None Help needed moving to and from a bed to a chair (including a wheelchair)?: None Help needed standing up from a chair using your arms (e.g., wheelchair or bedside chair)?: None Help needed to walk in hospital room?: A Little Help needed climbing 3-5 steps with a railing? : A Little 6 Click Score: 22    End of Session Equipment Utilized During Treatment: Gait belt (around upper chest on today-no issues with balance so probably can go without it) Activity Tolerance: Patient tolerated treatment well Patient left: in bed;with call bell/phone within reach   PT Visit Diagnosis: Muscle weakness (generalized) (M62.81)    Time: 4098-1191 PT Time Calculation (min) (ACUTE ONLY): 10 min   Charges:   PT Evaluation $PT Eval Low Complexity: 1 Low            Faye Ramsay, PT Acute Rehabilitation  Office: [phone]

## 2022-08-19 LAB — CBC
HCT: 36 % (ref 36.0–46.0)
Hemoglobin: 11.4 g/dL — ABNORMAL LOW (ref 12.0–15.0)
MCH: 32.2 pg (ref 26.0–34.0)
MCHC: 31.7 g/dL (ref 30.0–36.0)
MCV: 101.7 fL — ABNORMAL HIGH (ref 80.0–100.0)
Platelets: 307 10*3/uL (ref 150–400)
RBC: 3.54 MIL/uL — ABNORMAL LOW (ref 3.87–5.11)
RDW: 15 % (ref 11.5–15.5)
WBC: 4.9 10*3/uL (ref 4.0–10.5)
nRBC: 0 % (ref 0.0–0.2)

## 2022-08-19 LAB — BASIC METABOLIC PANEL
Anion gap: 11 (ref 5–15)
BUN: 6 mg/dL — ABNORMAL LOW (ref 8–23)
CO2: 22 mmol/L (ref 22–32)
Calcium: 8.3 mg/dL — ABNORMAL LOW (ref 8.9–10.3)
Chloride: 103 mmol/L (ref 98–111)
Creatinine, Ser: 0.73 mg/dL (ref 0.44–1.00)
GFR, Estimated: >60 mL/min (ref >60)
Glucose, Bld: 88 mg/dL (ref 70–99)
Potassium: 3.6 mmol/L (ref 3.5–5.1)
Sodium: 136 mmol/L (ref 135–145)

## 2022-08-19 MED ORDER — FERROUS SULFATE 325 (65 FE) MG PO TABS: 325.0000 mg | ORAL_TABLET | Freq: Two times a day (BID) | ORAL | 2 refills | Status: DC

## 2022-08-19 MED ORDER — LACTATED RINGERS IV BOLUS
1000.0000 mL | Freq: Once | INTRAVENOUS | Status: AC
  Administered 2022-08-19: 1000 mL via INTRAVENOUS

## 2022-08-19 MED ORDER — METHOCARBAMOL 500 MG PO TABS: 500.0000 mg | ORAL_TABLET | Freq: Four times a day (QID) | ORAL | 2 refills | Status: DC | PRN

## 2022-08-19 MED ORDER — LOPERAMIDE HCL 2 MG PO CAPS: 4.0000 mg | ORAL_CAPSULE | Freq: Four times a day (QID) | ORAL | 3 refills | Status: DC | PRN

## 2022-08-19 NOTE — Discharge Summary (Addendum)
Physician Discharge Summary    Patient ID: Alexandria [name] MRN: [id number] DOB/AGE: 1945-08-23  77 y.o.  Patient Care Team: Zola Button, Grayling Congress, DO as PCP - General (Family Medicine) Thomasene Ripple, DO as PCP - Cardiology (Cardiology) Freddy Finner, MD (Inactive) as Consulting Physician (Obstetrics and Gynecology) Thomasene Ripple, DO as Consulting Physician (Cardiology) Imogene Burn, MD as Consulting Physician (Gastroenterology) Karie Soda, MD as Consulting Physician (General Surgery)  Admit date: 08/13/2022  Discharge date: 08/19/2022  Hospital Stay = 6 days    Discharge Diagnoses:  Principal Problem:   Diverticulitis of colon Active Problems:   Ileostomy in place [facility])   Hypothyroidism   Fatigue   Degeneration of intervertebral disc of cervical region   Hx of myocardial infarction   Situational anxiety   ILD (interstitial lung disease) (HCC)   IPF (idiopathic pulmonary fibrosis) (HCC)   Pelvic abscess in female   Blowout of rectal stump (HCC)   6 Days Post-Op  08/13/2022  POST-OPERATIVE DIAGNOSIS:   -COLOSTOMY FOR COLON RESECTION. DESIRE FOR OSTOMY TAKEDOWN -CHRONIC RECTAL STUMP DEHISCENCE -PELVIC ABSCESS -HISTORY OF DIVERTICULITIS WITH STRICTURE & ABSCESS S/P HARTMANN COLECTOMY/COLOSTOMY   PROCEDURE:   -ROBOTIC COLOSTOMY TAKEDOWN WITH LOW COLORECTAL ANASTOMOSIS  -DIVERTING LOOP ILEOSTOMY -MOBILIZATION OF SPLENIC FLEXURE OF COLON -SMALL BOWEL RESECTION X 2 -LYSIS OF ADHESIONS X 2.5 HOURS (60% CASE) -FLEXIBLE SIGMOIDOSCOPY -INTRAOPERATIVE ASSESSMENT OF TISSUE VASCULAR PERFUSION USING ICG (indocyanine green) IMMUNOFLUORESCENCE -TRANSVERSUS ABDOMINIS PLANE (TAP) BLOCK - BILATERAL   SURGEON:  Ardeth Sportsman, MD   OR FINDINGS:  Very dense adhesions of especially small bowel to the pelvis with ileal serosa acting as a patch over a chronic rectal stump dehiscence.  Small abscess nearby.  Ileal small bowel resection x 2 needed.  Rectal stump foreshortened and  retracted.   Splenic flexure mobilization and high ligation of IMA/IMV pedicle needed for the colostomy to reach down in the very low pelvis.  ICG firefly done to confirm good perfusion.   It is a 29 EEA (mid descending to distal rectal) anastomosis.  At rest 5-6 cm from the anal verge.  Because of a low rectal anastomosis with abscess nearby and poor tissues, temporary diverting loop ileostomy done.   ##########################  Preoperative diagnosis:  Pelvic Abscess        Diverticulitis Postoperative diagnosis:  Same   Procedure: Cystoscopy Instillation of ureteral firefly constrast   Surgeon: Crist Fat, MD   Anesthesia: General   Complications: None   Intraoperative findings: normal bladder, orthotopic ureteral orifice   Consults: Case Management / Social Work, Physical Therapy, Occupational Therapy, Pharmacy, Nutrition, Wound ostomy consult nurse (WOCN), Anesthesia, and Urology  Hospital Course:   The patient underwent the surgery above.  Postoperatively, the patient gradually mobilized and advanced to a solid diet.  Pain and other symptoms were treated aggressively.    Patient did struggle with some soreness but it was a challenge to get her compliant on taking medicines.  Seen better controlled with scheduled Tylenol and Robaxin.  She had a focal abscess so she was diverted with a drain and 5 days of antibiotics.  Patient partially pulled drain out accidentally so it was removed.  By the time of discharge, the patient was walking well the hallways, eating food, having flatus.  Pain was well-controlled on an oral medications.  She is followed by wound ostomy.  Ileostomy diarrhea controlled with trying variety of soluble fiber FiberCon twice daily, iron 325 twice daily, loperamide 4 mg p.o. twice daily.  Patient initially interested in home health for ostomy care but then decided she wished to just keep following up with outpatient ostomy clinic.  She has help at  home.  Based on meeting discharge criteria and continuing to recover, I felt it was safe for the patient to be discharged from the hospital to further recover with close followup. Postoperative recommendations were discussed in detail.  They are written as well.  Discharged Condition: good  Discharge Exam: Blood pressure 110/62, pulse (!) 102, temperature 97.9 F (36.6 C), temperature source Oral, resp. rate 18, height 5' 3.75" (1.619 m), weight 65.1 kg, SpO2 94 %.  General: Pt awake/alert/oriented x4 in No acute distress Eyes: PERRL, normal EOM.  Sclera clear.  No icterus Neuro: CN II-XII intact w/o focal sensory/motor deficits. Lymph: No head/neck/groin lymphadenopathy Psych:  No delerium/psychosis/paranoia HENT: Normocephalic, Mucus membranes moist.  No thrush Neck: Supple, No tracheal deviation Chest:  No chest wall pain w good excursion CV:  Pulses intact.  Regular rhythm MS: Normal AROM mjr joints.  No obvious deformity Abdomen: Soft.  Nondistended.  Nontender.  Ileostomy pink with thick effluent in bag oatmeal consistency.  No evidence of peritonitis.  No incarcerated hernias. Ext:  SCDs BLE.  No mjr edema.  No cyanosis Skin: No petechiae / purpura   Disposition:    Follow-up Information     Karie Soda, MD Follow up on 09/07/2022.   Specialties: General Surgery, Colon and Rectal Surgery Why: To follow up after your operation Contact information: [address] [place] [address] [phone]                 Discharge disposition: 01-Home or Self Care       Discharge Instructions     Amb Referral to Ostomy Clinic   Complete by: As directed    Now has temporary diverting loop ileostomy   Reason for referral modifiers: Pre and post-operative counseling for ostomy management   Call MD for:   Complete by: As directed    FEVER > 101.5 F  (temperatures < 101.5 F are not significant)   Call MD for:  extreme fatigue   Complete by: As  directed    Call MD for:  persistant dizziness or light-headedness   Complete by: As directed    Call MD for:  persistant nausea and vomiting   Complete by: As directed    Call MD for:  redness, tenderness, or signs of infection (pain, swelling, redness, odor or green/yellow discharge around incision site)   Complete by: As directed    Call MD for:  severe uncontrolled pain   Complete by: As directed    Diet - low sodium heart healthy   Complete by: As directed    Start with a bland diet such as soups, liquids, starchy foods, low fat foods, etc. the first few days at home. Gradually advance to a solid, low-fat, high fiber diet by the end of the first week at home.   Add a fiber supplement to your diet (Metamucil, etc) If you feel full, bloated, or constipated, stay on a full liquid or pureed/blenderized diet for a few days until you feel better and are no longer constipated.   Discharge instructions   Complete by: As directed    See Discharge Instructions If you are not getting better after two weeks or are noticing you are getting worse, contact our office [phone] for further advice.  We may need to adjust your medications, re-evaluate  you in the office, send you to the emergency room, or see what other things we can do to help. The clinic staff is available to answer your questions during regular business hours (8:30am-5pm).  Please don't hesitate to call and ask to speak to one of our nurses for clinical concerns.    A surgeon from [facility] is always on call at the hospitals 24 hours/day If you have a medical emergency, go to the nearest emergency room or call 911.   Discharge wound care:   Complete by: As directed    It is good for closed incisions and even open wounds to be washed every day.  Shower every day.  Short baths are fine.  Wash the incisions and wounds clean with soap & water.    You may leave closed incisions open to air if it is dry.   You may cover  the incision with clean gauze & replace it after your daily shower for comfort.  TEGADERM:  You have clear gauze band-aid dressings over your closed incision(s).  Remove the dressings 3 days after surgery.   Driving Restrictions   Complete by: As directed    You may drive when: - you are no longer taking narcotic prescription pain medication - you can comfortably wear a seatbelt - you can safely make sudden turns/stops without pain.   Increase activity slowly   Complete by: As directed    Start light daily activities --- self-care, walking, climbing stairs- beginning the day after surgery.  Gradually increase activities as tolerated.  Control your pain to be active.  Stop when you are tired.  Ideally, walk several times a day, eventually an hour a day.   Most people are back to most day-to-day activities in a few weeks.  It takes 4-6 weeks to get back to unrestricted, intense activity. If you can walk 30 minutes without difficulty, it is safe to try more intense activity such as jogging, treadmill, bicycling, low-impact aerobics, swimming, etc. Save the most intensive and strenuous activity for last (Usually 4-8 weeks after surgery) such as sit-ups, heavy lifting, contact sports, etc.  Refrain from any intense heavy lifting or straining until you are off narcotics for pain control.  You will have off days, but things should improve week-by-week. DO NOT PUSH THROUGH PAIN.  Let pain be your guide: If it hurts to do something, don't do it.   Lifting restrictions   Complete by: As directed    If you can walk 30 minutes without difficulty, it is safe to try more intense activity such as jogging, treadmill, bicycling, low-impact aerobics, swimming, etc. Save the most intensive and strenuous activity for last (Usually 4-8 weeks after surgery) such as sit-ups, heavy lifting, contact sports, etc.   Refrain from any intense heavy lifting or straining until you are off narcotics for pain control.  You will  have off days, but things should improve week-by-week. DO NOT PUSH THROUGH PAIN.  Let pain be your guide: If it hurts to do something, don't do it.  Pain is your body warning you to avoid that activity for another week until the pain goes down.   May shower / Bathe   Complete by: As directed    May walk up steps   Complete by: As directed    Sexual Activity Restrictions   Complete by: As directed    You may have sexual intercourse when it is comfortable. If it hurts to do something, stop.  Allergies as of 08/19/2022       Reactions   Prednisone Other (See Comments)   Changed personality    Dilaudid [hydromorphone] Itching, Other (See Comments)   Dilaudid caused marked confusion   Atrovent Hfa [ipratropium Bromide Hfa] Itching, Other (See Comments)   Pt could not sleep   Cheratussin Ac [guaifenesin-codeine] Other (See Comments)   Headaches    Diltiazem Itching, Other (See Comments)   Makes patient sick. Shakes. GI   Hydrocodone Itching   Influenza Vaccines Other (See Comments)   Pt reports heart attack after last flu shot   Morphine Itching   Motrin [ibuprofen] Itching, Other (See Comments)   "Gives false reading in blood"   Oxycodone Itching   Codeine Itching, Nausea Only, Other (See Comments)   Pt takes promethazine with codeine at home        Medication List     TAKE these medications    ascorbic acid 500 MG tablet Commonly known as: VITAMIN C Take 500 mg by mouth daily.   aspirin EC 81 MG tablet Take 81 mg by mouth daily. Swallow whole.   benzonatate 100 MG capsule Commonly known as: Tessalon Perles Take 1 capsule (100 mg total) by mouth 3 (three) times daily as needed for cough.   ferrous sulfate 325 (65 FE) MG tablet Take 1 tablet (325 mg total) by mouth 2 (two) times daily with a meal.   loperamide 2 MG capsule Commonly known as: IMODIUM Take 2 capsules (4 mg total) by mouth every 6 (six) hours as needed for diarrhea or loose stools (Use if >378mL  ileostomy output every 6 hours).   methocarbamol 500 MG tablet Commonly known as: ROBAXIN Take 1 tablet (500 mg total) by mouth every 6 (six) hours as needed for muscle spasms.   Ofev 100 MG Caps Generic drug: Nintedanib Take 100 mg by mouth daily.   sodium chloride 0.65 % Soln nasal spray Commonly known as: OCEAN Place 1 spray into both nostrils as needed for congestion.   Synthroid 112 MCG tablet Generic drug: levothyroxine TAKE 1 TABLET BY MOUTH DAILY BEFORE BREAKFAST.   traMADol 50 MG tablet Commonly known as: ULTRAM Take 1-2 tablets (50-100 mg total) by mouth every 6 (six) hours as needed for moderate pain.               Discharge Care Instructions  (From admission, onward)           Start     Ordered   08/14/22 0000  Discharge wound care:       Comments: It is good for closed incisions and even open wounds to be washed every day.  Shower every day.  Short baths are fine.  Wash the incisions and wounds clean with soap & water.    You may leave closed incisions open to air if it is dry.   You may cover the incision with clean gauze & replace it after your daily shower for comfort.  TEGADERM:  You have clear gauze band-aid dressings over your closed incision(s).  Remove the dressings 3 days after surgery.   08/14/22 1039            Significant Diagnostic Studies:  Results for orders placed or performed during the hospital encounter of 08/13/22 (from the past 72 hour(s))  CBC     Status: Abnormal   Collection Time: 08/17/22  4:24 AM  Result Value Ref Range   WBC 6.0 4.0 - 10.5 K/uL   RBC 3.33 (  L) 3.87 - 5.11 MIL/uL   Hemoglobin 10.8 (L) 12.0 - 15.0 g/dL   HCT 16.1 (L) 09.6 - 04.5 %   MCV 99.7 80.0 - 100.0 fL   MCH 32.4 26.0 - 34.0 pg   MCHC 32.5 30.0 - 36.0 g/dL   RDW 40.9 81.1 - 91.4 %   Platelets 225 150 - 400 K/uL   nRBC 0.0 0.0 - 0.2 %    Comment: Performed at [facility], [address], [place], [place] [address]   Potassium     Status: Abnormal   Collection Time: 08/17/22  4:24 AM  Result Value Ref Range   Potassium 3.1 (L) 3.5 - 5.1 mmol/L    Comment: Performed at [facility], [address], [place], [place] [address]  Creatinine, serum     Status: None   Collection Time: 08/17/22  4:24 AM  Result Value Ref Range   Creatinine, Ser 0.70 0.44 - 1.00 mg/dL   GFR, Estimated >86 >57 mL/min    Comment: (NOTE) Calculated using the CKD-EPI Creatinine Equation (2021) Performed at [facility], [address] [place], [place] [address]   Magnesium     Status: None   Collection Time: 08/17/22  4:24 AM  Result Value Ref Range   Magnesium 1.9 1.7 - 2.4 mg/dL    Comment: Performed at [facility], [address] [place], [place] [address]  CBC     Status: Abnormal   Collection Time: 08/18/22  8:51 AM  Result Value Ref Range   WBC 6.4 4.0 - 10.5 K/uL   RBC 3.51 (L) 3.87 - 5.11 MIL/uL   Hemoglobin 11.2 (L) 12.0 - 15.0 g/dL   HCT 41.3 (L) 24.4 - 01.0 %   MCV 101.1 (H) 80.0 - 100.0 fL   MCH 31.9 26.0 - 34.0 pg   MCHC 31.5 30.0 - 36.0 g/dL   RDW 27.2 53.6 - 64.4 %   Platelets 255 150 - 400 K/uL   nRBC 0.0 0.0 - 0.2 %    Comment: Performed at [facility], [address] [place], [place] [address]  Basic metabolic panel     Status: Abnormal   Collection Time: 08/18/22  8:51 AM  Result Value Ref Range   Sodium 136 135 - 145 mmol/L   Potassium 3.8 3.5 - 5.1 mmol/L   Chloride 104 98 - 111 mmol/L   CO2 23 22 - 32 mmol/L   Glucose, Bld 90 70 - 99 mg/dL    Comment: Glucose reference range applies only to samples taken after fasting for at least 8 hours.   BUN 6 (L) 8 - 23 mg/dL   Creatinine, Ser 2.59 0.44 - 1.00 mg/dL   Calcium 8.2 (L) 8.9 - 10.3 mg/dL   GFR, Estimated >56 >38 mL/min    Comment: (NOTE) Calculated using the CKD-EPI Creatinine Equation (2021)    Anion gap 9 5 - 15    Comment: Performed at [facility], [address], [place], [place] [address]  Magnesium     Status: None   Collection Time: 08/18/22  8:51 AM  Result Value Ref Range   Magnesium 1.9 1.7 - 2.4 mg/dL    Comment: Performed at [facility], [address], [place], [place] [address]  CBC     Status: Abnormal   Collection Time: 08/19/22  4:36 AM  Result Value Ref Range   WBC 4.9 4.0 - 10.5 K/uL  RBC 3.54 (L) 3.87 - 5.11 MIL/uL   Hemoglobin 11.4 (L) 12.0 - 15.0 g/dL   HCT 16.1 09.6 - 04.5 %   MCV 101.7 (H) 80.0 - 100.0 fL   MCH 32.2 26.0 - 34.0 pg   MCHC 31.7 30.0 - 36.0 g/dL   RDW 40.9 81.1 - 91.4 %   Platelets 307 150 - 400 K/uL   nRBC 0.0 0.0 - 0.2 %    Comment: Performed at [facility], [address] [place], [place] [address]  Basic metabolic panel     Status: Abnormal   Collection Time: 08/19/22  4:36 AM  Result Value Ref Range   Sodium 136 135 - 145 mmol/L   Potassium 3.6 3.5 - 5.1 mmol/L   Chloride 103 98 - 111 mmol/L   CO2 22 22 - 32 mmol/L   Glucose, Bld 88 70 - 99 mg/dL    Comment: Glucose reference range applies only to samples taken after fasting for at least 8 hours.   BUN 6 (L) 8 - 23 mg/dL   Creatinine, Ser 6.21 0.44 - 1.00 mg/dL   Calcium 8.3 (L) 8.9 - 10.3 mg/dL   GFR, Estimated >30 >86 mL/min    Comment: (NOTE) Calculated using the CKD-EPI Creatinine Equation (2021)    Anion gap 11 5 - 15    Comment: Performed at [facility], [address], [place], [place] [address]   *Note: Due to a large number of results and/or encounters for the requested time period, some results have not been displayed. A complete set of results can be found in Results Review.    No results found.  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 the left ventricular wall motion is normal   Hypothyroidism    IPF (idiopathic pulmonary fibrosis) (HCC) 01/2018   Myocardial infarction (HCC)    hx of 30 years ago   Pancreatitis    Thyroid disease    TIA (transient ischemic attack)    8 years ago    Past Surgical History:  Procedure Laterality Date   BREAST BIOPSY Left    [place]   CARDIAC CATHETERIZATION     10/2013   CATARACT EXTRACTION Bilateral 03/22/2018   FLEXIBLE SIGMOIDOSCOPY N/A 08/13/2022   Procedure: FLEXIBLE SIGMOIDOSCOPY;  Surgeon: Karie Soda, MD;  Location: [facility];  Service: General;  Laterality: N/A;   LEFT HEART CATHETERIZATION WITH CORONARY ANGIOGRAM N/A 10/25/2013   Procedure: LEFT HEART CATHETERIZATION WITH CORONARY ANGIOGRAM;  Surgeon: Lennette Bihari, MD;  Location[facility] CATH LAB;  Service: Cardiovascular;  Laterality: N/A;   LYSIS OF ADHESION N/A 08/13/2022   Procedure: LYSIS OF ADHESION, DIVERTING ILEOSTOMY;  Surgeon: Karie Soda, MD;  Location: [facility];  Service: General;  Laterality: N/A;   NECK SURGERY     ACDF x 2      done in [place] more than 25 years, doesn't know levels   TOTAL KNEE ARTHROPLASTY     Bilateral x's 2   VAGINAL HYSTERECTOMY  10/17/1998   Ron  Jennette Kettle   VIDEO BRONCHOSCOPY Bilateral 01/24/2018   Procedure: VIDEO BRONCHOSCOPY WITH FLUORO;  Surgeon: Lupita Leash, MD;  Location[facility] ENDOSCOPY;  Service: Cardiopulmonary;  Laterality: Bilateral;   XI ROBOTIC ASSISTED COLOSTOMY TAKEDOWN N/A 08/13/2022   Procedure: ROBOTIC OSTOMY TAKEDOWN, SMALL BOWEL RESECTION X 2, BILATERAL TAP BLOCK, TISSUE PERFUSSION ASSESMENT VIA FIREFLY, MOBILAZATION OF SPLENIC FLEXURE;  Surgeon: Karie Soda, MD;  Location: [facility];  Service: General;  Laterality: N/A;   XI ROBOTIC ASSISTED LOWER ANTERIOR RESECTION N/A 03/31/2022    Procedure: XI ROBOTIC ASSISTED LOWER ANTERIOR RESECTION;  Surgeon: Karie Soda, MD;  Location: [facility];  Service: General;  Laterality: N/A;    Social History   Socioeconomic History   Marital status: Married    Spouse name: Tasia Catchings   Number of children: 3   Years of education: Jr college   Highest education level: Not on file  Occupational History   Occupation: Leisure centre manager: UNEMPLOYED   Occupation: retired  Tobacco Use   Smoking status: Never   Smokeless tobacco: Never  Building services engineer Use: Never used  Substance and Sexual Activity   Alcohol use: No    Alcohol/week: 0.0 standard drinks of alcohol   Drug use: No   Sexual activity: Not Currently  Other Topics Concern   Not on file  Social History Narrative   Lives with husband Tasia Catchings   Caffeine use: coffee (2 cups per day)   Mostly right-handed   Social Determinants of Health   Financial Resource Strain: Not on file  Food Insecurity: No Food Insecurity (08/14/2022)   Hunger Vital Sign    Worried About Running Out of Food in the Last Year: Never true    Ran Out of Food in the Last Year: Never true  Transportation Needs: No Transportation Needs (08/14/2022)   PRAPARE - Administrator, Civil Service (Medical): No    Lack of Transportation (Non-Medical): No  Physical Activity: Not on file  Stress: Not on file  Social Connections: Not on file  Intimate Partner Violence: Not At Risk (08/14/2022)   Humiliation, Afraid, Rape, and Kick questionnaire    Fear of Current or Ex-Partner: No    Emotionally Abused: No    Physically Abused: No    Sexually Abused: No    Family History  Problem Relation Age of Onset   Ovarian cancer Mother    Uterine cancer Mother    Lung cancer Father    HIV Brother        1982   Kidney cancer Brother    Lung cancer Brother    Other Brother        Mouth Cancer   Colon cancer Maternal Aunt    Heart disease Maternal Grandmother    Stroke Maternal Grandmother     Hypertension Maternal Grandmother    Diabetes Maternal Grandmother    Arthritis Other    Esophageal cancer Neg Hx    Liver disease Neg Hx    Rectal cancer Neg Hx    Stomach cancer Neg Hx     Current Facility-Administered Medications  Medication Dose Route Frequency Provider Last Rate Last Admin   acetaminophen (TYLENOL) tablet 1,000 mg  1,000 mg Oral Trecia Rogers, MD   1,000 mg at 08/19/22 0601   alum & mag hydroxide-simeth (MAALOX/MYLANTA) 200-200-20 MG/5ML suspension 30 mL  30 mL Oral Q6H PRN Karie Soda, MD   30 mL at 08/16/22 1757   ascorbic acid (VITAMIN C) tablet  500 mg  500 mg Oral Daily Karie Soda, MD   500 mg at 08/18/22 4098   aspirin EC tablet 81 mg  81 mg Oral Daily Karie Soda, MD   81 mg at 08/18/22 1191   benzonatate (TESSALON) capsule 100 mg  100 mg Oral TID PRN Karie Soda, MD       diphenhydrAMINE (BENADRYL) 12.5 MG/5ML elixir 12.5 mg  12.5 mg Oral Q6H PRN Karie Soda, MD   12.5 mg at 08/15/22 2130   Or   diphenhydrAMINE (BENADRYL) injection 12.5 mg  12.5 mg Intravenous Q6H PRN Karie Soda, MD       enoxaparin (LOVENOX) injection 40 mg  40 mg Subcutaneous Q24H Karie Soda, MD   40 mg at 08/18/22 0758   feeding supplement (ENSURE SURGERY) liquid 237 mL  237 mL Oral BID BM Karie Soda, MD       fentaNYL (SUBLIMAZE) injection 12.5-25 mcg  12.5-25 mcg Intravenous Q2H PRN Karie Soda, MD   25 mcg at 08/18/22 1501   ferrous sulfate tablet 325 mg  325 mg Oral BID WC Karie Soda, MD   325 mg at 08/18/22 0758   hydrALAZINE (APRESOLINE) injection 10 mg  10 mg Intravenous Q2H PRN Karie Soda, MD       lactated ringers bolus 1,000 mL  1,000 mL Intravenous Q8H PRN Ayala Ribble, Viviann Spare, MD       lactated ringers bolus 1,000 mL  1,000 mL Intravenous Once Karie Soda, MD       levothyroxine (SYNTHROID) tablet 112 mcg  112 mcg Oral QAC breakfast Karie Soda, MD   112 mcg at 08/19/22 0601   lip balm (CARMEX) ointment   Topical BID Karie Soda, MD   75 Application  at 08/18/22 2200   loperamide (IMODIUM) capsule 2-4 mg  2-4 mg Oral Q6H PRN Karie Soda, MD       loperamide (IMODIUM) capsule 4 mg  4 mg Oral BID Karie Soda, MD   4 mg at 08/18/22 2112   magic mouthwash  15 mL Oral QID PRN Karie Soda, MD       melatonin tablet 3 mg  3 mg Oral QHS PRN Karie Soda, MD   3 mg at 08/16/22 2347   menthol-cetylpyridinium (CEPACOL) lozenge 3 mg  1 lozenge Oral PRN Karie Soda, MD       methocarbamol (ROBAXIN) tablet 500 mg  500 mg Oral QID Karie Soda, MD   500 mg at 08/18/22 2300   metoprolol tartrate (LOPRESSOR) injection 5 mg  5 mg Intravenous Q6H PRN Karie Soda, MD   5 mg at 08/18/22 0723   metoprolol tartrate (LOPRESSOR) tablet 12.5 mg  12.5 mg Oral Q12H PRN Karie Soda, MD       Nintedanib (Ofev) CAPS 100 mg  100 mg Oral Daily Karie Soda, MD       ondansetron [facility]) tablet 4 mg  4 mg Oral Q6H PRN Karie Soda, MD       Or   ondansetron [facility]) injection 4 mg  4 mg Intravenous Q6H PRN Karie Soda, MD   4 mg at 08/18/22 1252   phenol (CHLORASEPTIC) mouth spray 2 spray  2 spray Mouth/Throat PRN Karie Soda, MD       piperacillin-tazobactam (ZOSYN) IVPB 3.375 g  3.375 g Intravenous Trixie Deis, MD 12.5 mL/hr at 08/19/22 0355 3.375 g at 08/19/22 0355   polycarbophil (FIBERCON) tablet 625 mg  625 mg Oral BID Karie Soda, MD   625 mg at 08/18/22 2112  potassium chloride SA (KLOR-CON M) CR tablet 40 mEq  40 mEq Oral Daily Karie Soda, MD   40 mEq at 08/18/22 1610   prochlorperazine (COMPAZINE) tablet 10 mg  10 mg Oral Q6H PRN Karie Soda, MD       Or   prochlorperazine (COMPAZINE) injection 5-10 mg  5-10 mg Intravenous Q6H PRN Karie Soda, MD   10 mg at 08/16/22 0459   simethicone (MYLICON) 40 MG/0.6ML suspension 80 mg  80 mg Oral QID PRN Karie Soda, MD   80 mg at 08/17/22 1735   simethicone (MYLICON) chewable tablet 40 mg  40 mg Oral Q6H PRN Karie Soda, MD       sodium chloride (OCEAN) 0.65 % nasal spray 1 spray  1  spray Each Nare PRN Karie Soda, MD       traMADol Janean Sark) tablet 50 mg  50 mg Oral QHS,MR X 1 Karie Soda, MD   50 mg at 08/18/22 2307   traMADol (ULTRAM) tablet 50-100 mg  50-100 mg Oral Q6H PRN Karie Soda, MD   50 mg at 08/19/22 0601     Allergies  Allergen Reactions   Prednisone Other (See Comments)    Changed personality    Dilaudid [Hydromorphone] Itching and Other (See Comments)    Dilaudid caused marked confusion   Atrovent Hfa [Ipratropium Bromide Hfa] Itching and Other (See Comments)    Pt could not sleep   Cheratussin Ac [Guaifenesin-Codeine] Other (See Comments)    Headaches    Diltiazem Itching and Other (See Comments)    Makes patient sick. Shakes. GI   Hydrocodone Itching   Influenza Vaccines Other (See Comments)    Pt reports heart attack after last flu shot   Morphine Itching   Motrin [Ibuprofen] Itching and Other (See Comments)    "Gives false reading in blood"   Oxycodone Itching   Codeine Itching, Nausea Only and Other (See Comments)    Pt takes promethazine with codeine at home    Signed:   Ardeth Sportsman, MD, FACS, MASCRS Esophageal, Gastrointestinal & Colorectal Surgery Robotic and Minimally Invasive Surgery  [facility] [address] [place], [place] [address] [phone] Fax [phone] Main  CONTACT INFORMATION:  Weekday (9AM-5PM): Call [facility] main office at [phone]  Weeknight (5PM-9AM) or Weekend/Holiday: Check [email or web address] (password " [email or web address]") for General Surgery [facility] coverage  (Please, do not use SecureChat as it is not reliable communication to reach operating surgeons for immediate patient care given surgeries/outpatient duties/clinic/cross-coverage/off post-call which would lead to a delay in care.  Epic staff messaging available for outptient concerns, but may not be answered for 48 hours or more).     08/19/2022, 8:01 AM

## 2022-08-19 NOTE — Plan of Care (Signed)
Patient is stable for discharge. Discharge instructions have been given. All questions answered, patient is discharged to home with family.  

## 2022-08-21 ENCOUNTER — Ambulatory Visit (HOSPITAL_COMMUNITY)
Admit: 2022-08-21 | Discharge: 2022-08-21 | Disposition: A | Discharge status: Home / Self Care | Payer: Medicare Other | Attending: Nurse Practitioner | Admitting: Nurse Practitioner

## 2022-08-21 ENCOUNTER — Telehealth: Payer: Self-pay

## 2022-08-21 ENCOUNTER — Other Ambulatory Visit: Payer: Self-pay | Admitting: Family Medicine

## 2022-08-21 DIAGNOSIS — I252 Old myocardial infarction: Secondary | ICD-10-CM | POA: Insufficient documentation

## 2022-08-21 DIAGNOSIS — Z86718 Personal history of other venous thrombosis and embolism: Secondary | ICD-10-CM | POA: Insufficient documentation

## 2022-08-21 DIAGNOSIS — Z79890 Hormone replacement therapy (postmenopausal): Secondary | ICD-10-CM | POA: Insufficient documentation

## 2022-08-21 DIAGNOSIS — Z932 Ileostomy status: Secondary | ICD-10-CM

## 2022-08-21 DIAGNOSIS — E039 Hypothyroidism, unspecified: Secondary | ICD-10-CM | POA: Insufficient documentation

## 2022-08-21 DIAGNOSIS — Z8673 Personal history of transient ischemic attack (TIA), and cerebral infarction without residual deficits: Secondary | ICD-10-CM | POA: Insufficient documentation

## 2022-08-21 DIAGNOSIS — J84112 Idiopathic pulmonary fibrosis: Secondary | ICD-10-CM | POA: Diagnosis not present

## 2022-08-21 DIAGNOSIS — Z433 Encounter for attention to colostomy: Secondary | ICD-10-CM | POA: Insufficient documentation

## 2022-08-21 DIAGNOSIS — Z79899 Other long term (current) drug therapy: Secondary | ICD-10-CM | POA: Insufficient documentation

## 2022-08-21 DIAGNOSIS — Z7982 Long term (current) use of aspirin: Secondary | ICD-10-CM | POA: Insufficient documentation

## 2022-08-21 DIAGNOSIS — Z8719 Personal history of other diseases of the digestive system: Secondary | ICD-10-CM | POA: Insufficient documentation

## 2022-08-21 NOTE — Progress Notes (Signed)
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 the left ventricular wall motion is normal   Hypothyroidism    IPF (idiopathic pulmonary fibrosis) (HCC) 01/2018   Myocardial infarction (HCC)    hx of 30 years ago   Pancreatitis    Thyroid disease    TIA (transient ischemic attack)    8 years ago   Family History  Problem Relation Age of Onset   Ovarian cancer Mother    Uterine cancer Mother    Lung cancer Father    HIV Brother        63   Kidney cancer Brother    Lung cancer Brother    Other Brother        Mouth Cancer   Colon cancer Maternal Aunt    Heart disease Maternal Grandmother    Stroke Maternal Grandmother    Hypertension Maternal Grandmother    Diabetes Maternal Grandmother    Arthritis Other    Esophageal cancer Neg Hx    Liver disease Neg Hx    Rectal cancer Neg Hx    Stomach cancer Neg Hx    Allergies  Allergen Reactions   Prednisone Other (See Comments)    Changed personality    Dilaudid [Hydromorphone] Itching and Other (See Comments)    Dilaudid caused marked confusion   Atrovent Hfa [Ipratropium Bromide Hfa] Itching and Other (See Comments)    Pt could not sleep   Cheratussin Ac [Guaifenesin-Codeine] Other (See Comments)    Headaches    Diltiazem Itching and Other (See Comments)    Makes patient sick. Shakes. GI   Hydrocodone Itching    Influenza Vaccines Other (See Comments)    Pt reports heart attack after last flu shot   Morphine Itching   Motrin [Ibuprofen] Itching and Other (See Comments)    "Gives false reading in blood"   Oxycodone Itching   Codeine Itching, Nausea Only and Other (See Comments)    Pt takes promethazine with codeine at home   Current Outpatient Medications  Medication Sig Dispense Refill Last Dose   fluconazole (DIFLUCAN) 100 MG tablet Take 1 tablet (100 mg total) by mouth daily for 1 day. 1 tablet 0    aspirin EC 81 MG tablet Take 81 mg by mouth daily. Swallow whole.      benzonatate (TESSALON PERLES) 100 MG capsule Take 1 capsule (100 mg total) by mouth 3 (three) times daily as needed for cough. 20 capsule 0    ferrous sulfate 325 (65 FE) MG tablet Take 1 tablet (325 mg total) by mouth 2 (two) times daily with a meal. 60 tablet 2    loperamide (IMODIUM) 2 MG capsule Take 2 capsules (4 mg total) by mouth every 6 (six) hours as needed for diarrhea or loose stools (Use if >311mL ileostomy output every 6 hours). 90  capsule 3    methocarbamol (ROBAXIN) 500 MG tablet Take 1 tablet (500 mg total) by mouth every 6 (six) hours as needed for muscle spasms. 20 tablet 2    OFEV 100 MG CAPS Take 100 mg by mouth daily.      sodium chloride (OCEAN) 0.65 % SOLN nasal spray Place 1 spray into both nostrils as needed for congestion.      SYNTHROID 112 MCG tablet TAKE 1 TABLET BY MOUTH EVERY DAY BEFORE BREAKFAST 90 tablet 0    traMADol (ULTRAM) 50 MG tablet Take 1-2 tablets (50-100 mg total) by mouth every 6 (six) hours as needed for moderate pain. 20 tablet 0    vitamin C (ASCORBIC ACID) 500 MG tablet Take 500 mg by mouth daily.      No current facility-administered medications for this encounter.   ROS  Review of Systems  Constitutional:  Positive for appetite change and fatigue.  Gastrointestinal:        Abscess was present for colostomy takedown procedure, converted to ileostomy  Skin:  Positive for pallor.   Psychiatric/Behavioral: Negative.    All other systems reviewed and are negative.  Vital signs:  BP (!) 148/76 (BP Location: Right Arm)   Pulse 88   Temp 97.7 F (36.5 C) (Oral)   Resp 16   SpO2 93%  Exam:  Physical Exam Vitals reviewed.  Constitutional:      Appearance: Normal appearance.  Abdominal:     Palpations: Abdomen is soft.     Comments: LLQ ileostomy  Skin:    General: Skin is warm and dry.  Neurological:     Mental Status: She is alert and oriented to person, place, and time.  Psychiatric:        Mood and Affect: Mood normal.        Behavior: Behavior normal.     Stoma type/location:  LLQ loop ileostomy Stomal assessment/size:  oval:  2 cm x 0.8 cm pink and moist, budded Peristomal assessment:  intact Treatment options for stomal/peristomal skin: barrier ring and 2 piece pouch Output: liquid brown stool Ostomy pouching: 2pc. pouch  Education provided:  here with friend today.  States she has very little appetite and feels weak.      Impression/dx  ileostomy Discussion  We discuss differences in colostomy vs ileostomy.  We discuss diet, appearance of stool, risk for dehydration and blockage.  I provide her with written materials on diet, life with an ostomy Plan  See back one week.     Visit time: 45 minutes.   Maple Hudson FNP-BC

## 2022-08-21 NOTE — Transitions of Care (Post Inpatient/ED Visit) (Signed)
   08/21/2022  Name: Alexandria [name] MRN: [id number] DOB: 1945-10-31  Today's TOC FU Call Status: Today's TOC FU Call Status:: Unsuccessul Call (1st Attempt) Unsuccessful Call (1st Attempt) Date: 08/21/22  Attempted to reach the patient regarding the most recent Inpatient/ED visit.  Follow Up Plan: Additional outreach attempts will be made to reach the patient to complete the Transitions of Care (Post Inpatient/ED visit) call.   Jodelle Gross, RN, BSN, CCM Care Management Coordinator [place]

## 2022-08-24 ENCOUNTER — Ambulatory Visit (HOSPITAL_COMMUNITY)
Admit: 2022-08-24 | Discharge: 2022-08-24 | Disposition: A | Discharge status: Home / Self Care | Payer: Medicare Other | Attending: Surgery | Admitting: Surgery

## 2022-08-24 ENCOUNTER — Telehealth: Payer: Self-pay

## 2022-08-24 ENCOUNTER — Other Ambulatory Visit (HOSPITAL_COMMUNITY): Payer: Self-pay | Admitting: Nurse Practitioner

## 2022-08-24 DIAGNOSIS — R198 Other specified symptoms and signs involving the digestive system and abdomen: Secondary | ICD-10-CM | POA: Diagnosis not present

## 2022-08-24 DIAGNOSIS — L24B3 Irritant contact dermatitis related to fecal or urinary stoma or fistula: Secondary | ICD-10-CM | POA: Diagnosis not present

## 2022-08-24 DIAGNOSIS — Z432 Encounter for attention to ileostomy: Secondary | ICD-10-CM | POA: Insufficient documentation

## 2022-08-24 DIAGNOSIS — Z932 Ileostomy status: Secondary | ICD-10-CM

## 2022-08-24 DIAGNOSIS — Z433 Encounter for attention to colostomy: Secondary | ICD-10-CM | POA: Diagnosis present

## 2022-08-24 MED ORDER — FLUCONAZOLE 100 MG PO TABS: 100.0000 mg | ORAL_TABLET | Freq: Every day | ORAL | 0 refills | Status: AC

## 2022-08-24 NOTE — Progress Notes (Signed)
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 the left ventricular wall motion is normal   Hypothyroidism    IPF (idiopathic pulmonary fibrosis) (HCC) 01/2018   Myocardial infarction (HCC)    hx of 30 years ago   Pancreatitis    Thyroid disease    TIA (transient ischemic attack)    8 years ago   Family History  Problem Relation Age of Onset   Ovarian cancer Mother    Uterine cancer Mother    Lung cancer Father    HIV Brother        82   Kidney cancer Brother    Lung cancer Brother    Other Brother        Mouth Cancer   Colon cancer Maternal Aunt    Heart disease Maternal Grandmother    Stroke Maternal Grandmother    Hypertension Maternal Grandmother    Diabetes Maternal Grandmother    Arthritis Other    Esophageal cancer Neg Hx    Liver disease Neg Hx    Rectal cancer Neg Hx    Stomach cancer Neg Hx    Allergies  Allergen Reactions   Prednisone Other (See Comments)    Changed personality    Dilaudid [Hydromorphone] Itching and Other (See Comments)    Dilaudid caused marked confusion   Atrovent Hfa [Ipratropium Bromide Hfa] Itching and Other (See Comments)    Pt could not sleep   Cheratussin Ac [Guaifenesin-Codeine] Other (See Comments)    Headaches    Diltiazem Itching and Other (See Comments)    Makes patient sick. Shakes. GI   Hydrocodone Itching   Influenza Vaccines Other (See Comments)    Pt reports heart attack after last  flu shot   Morphine Itching   Motrin [Ibuprofen] Itching and Other (See Comments)    "Gives false reading in blood"   Oxycodone Itching   Codeine Itching, Nausea Only and Other (See Comments)    Pt takes promethazine with codeine at home   Current Outpatient Medications  Medication Sig Dispense Refill Last Dose   aspirin EC 81 MG tablet Take 81 mg by mouth daily. Swallow whole.      benzonatate (TESSALON PERLES) 100 MG capsule Take 1 capsule (100 mg total) by mouth 3 (three) times daily as needed for cough. 20 capsule 0    ferrous sulfate 325 (65 FE) MG tablet Take 1 tablet (325 mg total) by mouth 2 (two) times daily with a meal. 60 tablet 2    fluconazole (DIFLUCAN) 100 MG tablet Take 1 tablet (100 mg total) by mouth daily for 1 day. 1 tablet 0    loperamide (IMODIUM) 2 MG capsule Take 2 capsules (4 mg total) by mouth every 6 (six) hours as needed for diarrhea or loose stools (Use if >365mL ileostomy output every 6 hours). 90 capsule 3    methocarbamol (ROBAXIN) 500 MG tablet Take 1  tablet (500 mg total) by mouth every 6 (six) hours as needed for muscle spasms. 20 tablet 2    OFEV 100 MG CAPS Take 100 mg by mouth daily.      sodium chloride (OCEAN) 0.65 % SOLN nasal spray Place 1 spray into both nostrils as needed for congestion.      SYNTHROID 112 MCG tablet TAKE 1 TABLET BY MOUTH EVERY DAY BEFORE BREAKFAST 90 tablet 0    traMADol (ULTRAM) 50 MG tablet Take 1-2 tablets (50-100 mg total) by mouth every 6 (six) hours as needed for moderate pain. 20 tablet 0    vitamin C (ASCORBIC ACID) 500 MG tablet Take 500 mg by mouth daily.      No current facility-administered medications for this encounter.   ROS  Review of Systems  Constitutional:  Positive for fatigue.  Gastrointestinal:        LMQ loop ileostomy  Skin:  Positive for rash.  All other systems reviewed and are negative.  Vital signs:  There were no vitals taken for this visit. Exam:  Physical Exam Constitutional:       Appearance: Normal appearance.  Abdominal:     Palpations: Abdomen is soft.  Skin:    General: Skin is warm and dry.     Findings: Erythema present.  Neurological:     Mental Status: She is alert and oriented to person, place, and time.  Psychiatric:        Mood and Affect: Mood normal.        Behavior: Behavior normal.     Stoma type/location:  LMQ  ileostomy Stomal assessment/size:  2 cm x 1 cm loop ileostomy Peristomal assessment:  intact Treatment options for stomal/peristomal skin: barrier ring and 2piece pouch Output: liquid brown stool Ostomy pouching: 2pc. 2 1/4" pouch with barrier ring Education provided:  we discuss patient's reluctance to change her pouch.  She agrees to try hands on pouch changes to improve confidence    Impression/dx  Loop ileostomy Discussion  Patient will gain independence in self care Emotional support regarding need for ileostomy Plan  See back as needed for ongoing teaching.     Visit time: 55 minutes.   Maple Hudson FNP-BC

## 2022-08-24 NOTE — Transitions of Care (Post Inpatient/ED Visit) (Signed)
08/24/2022  Name: Alexandria [name] MRN: [id number] DOB: Aug 06, 1945  Today's TOC FU Call Status: Today's TOC FU Call Status:: Successful TOC FU Call Competed  Transition Care Management Follow-up Telephone Call Date of Discharge: 08/19/22 Discharge Facility: [facility]) Type of Discharge: Inpatient Admission Primary Inpatient Discharge Diagnosis:: Pelvic Abscess/ Diverticulitis How have you been since you were released from the hospital?: Better (a little better, but very slow healing) Any questions or concerns?: No  Items Reviewed: Did you receive and understand the discharge instructions provided?: Yes Medications obtained,verified, and reconciled?: Yes (Medications Reviewed) Any new allergies since your discharge?: No Dietary orders reviewed?: No Do you have support at home?: Yes People in Home: child(ren), adult Name of Support/Comfort Primary Source: Alissa  Medications Reviewed Today: Medications Reviewed Today     Reviewed by Jodelle Gross, RN (Case Manager) on 08/24/22 at 1453  Med List Status: <None>   Medication Order Taking? Sig Documenting Provider Last Dose Status Informant  aspirin EC 81 MG tablet [id number] No Take 81 mg by mouth daily. Swallow whole. [provider] Unknown Active Self  benzonatate (TESSALON PERLES) 100 MG capsule [id number] No Take 1 capsule (100 mg total) by mouth 3 (three) times daily as needed for cough. Seabron Spates R, DO Unknown Active   ferrous sulfate 325 (65 FE) MG tablet [id number] No Take 1 tablet (325 mg total) by mouth 2 (two) times daily with a meal. Karie Soda, MD Unknown Active   fluconazole (DIFLUCAN) 100 MG tablet [id number] Yes Take 1 tablet (100 mg total) by mouth daily for 1 day. Mayo, Eugenio Hoes, FNP Taking Active   loperamide (IMODIUM) 2 MG capsule [id number] Yes Take 2 capsules (4 mg total) by mouth every 6 (six) hours as needed for diarrhea or loose stools (Use if >369mL ileostomy output every 6 hours).  Karie Soda, MD Taking Active   methocarbamol (ROBAXIN) 500 MG tablet [id number] Yes Take 1 tablet (500 mg total) by mouth every 6 (six) hours as needed for muscle spasms. Karie Soda, MD Taking Active   OFEV 100 MG CAPS [id number] Yes Take 100 mg by mouth daily. [provider] Taking Active Self  sodium chloride (OCEAN) 0.65 % SOLN nasal spray [id number] No Place 1 spray into both nostrils as needed for congestion. [provider] Unknown Active Self  SYNTHROID 112 MCG tablet [id number] Yes TAKE 1 TABLET BY MOUTH EVERY DAY BEFORE BREAKFAST Zola Button, Grayling Congress, DO Taking Active   traMADol (ULTRAM) 50 MG tablet [id number] Yes Take 1-2 tablets (50-100 mg total) by mouth every 6 (six) hours as needed for moderate pain. Karie Soda, MD Taking Active   vitamin C (ASCORBIC ACID) 500 MG tablet [id number] No Take 500 mg by mouth daily. [provider] Unknown Active Self            Home Care and Equipment/Supplies: Were Home Health Services Ordered?: No Any new equipment or medical supplies ordered?: No  Functional Questionnaire: Do you need assistance with bathing/showering or dressing?: No Do you need assistance with meal preparation?: No Do you need assistance with eating?: No Do you have difficulty maintaining continence: No Do you need assistance with getting out of bed/getting out of a chair/moving?: No Do you have difficulty managing or taking your medications?: No  Follow up appointments reviewed: PCP Follow-up appointment confirmed?: NA Specialist Hospital Follow-up appointment confirmed?: Yes Date of Specialist follow-up appointment?: 09/07/22 Follow-Up Specialty Provider:: Dr. Michaell Cowing (surgeon) Do you need transportation to your follow-up appointment?: No  Do you understand care options if your condition(s) worsen?: Yes-patient verbalized understanding  SDOH Interventions Today    Flowsheet Row Most Recent Value  SDOH Interventions   Food Insecurity  Interventions Intervention Not Indicated  Housing Interventions Intervention Not Indicated  Transportation Interventions Intervention Not Indicated  Utilities Interventions Intervention Not Indicated      Jodelle Gross, RN, BSN, CCM Care Management Coordinator [place] Phone: [phone]/Fax: [phone]

## 2022-08-25 DIAGNOSIS — R198 Other specified symptoms and signs involving the digestive system and abdomen: Secondary | ICD-10-CM | POA: Insufficient documentation

## 2022-08-26 ENCOUNTER — Ambulatory Visit (HOSPITAL_COMMUNITY)
Admission: RE | Admit: 2022-08-26 | Discharge: 2022-08-26 | Disposition: A | Discharge status: Home / Self Care | Payer: Medicare Other | Source: Ambulatory Visit | Attending: Nurse Practitioner | Admitting: Nurse Practitioner

## 2022-08-26 DIAGNOSIS — E860 Dehydration: Secondary | ICD-10-CM | POA: Diagnosis not present

## 2022-08-26 DIAGNOSIS — Z432 Encounter for attention to ileostomy: Secondary | ICD-10-CM | POA: Diagnosis not present

## 2022-08-26 DIAGNOSIS — L24B1 Irritant contact dermatitis related to digestive stoma or fistula: Secondary | ICD-10-CM | POA: Diagnosis not present

## 2022-08-26 DIAGNOSIS — Z932 Ileostomy status: Secondary | ICD-10-CM | POA: Diagnosis present

## 2022-08-26 NOTE — Discharge Instructions (Signed)
Continue to increase fluids as you can Perform next pouch change with your friend watching to assist as needed.

## 2022-08-26 NOTE — Progress Notes (Signed)
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 the left ventricular wall motion is normal   Hypothyroidism    IPF (idiopathic pulmonary fibrosis) (HCC) 01/2018   Myocardial infarction (HCC)    hx of 30 years ago   Pancreatitis    Thyroid disease    TIA (transient ischemic attack)    8 years ago   Family History  Problem Relation Age of Onset   Ovarian cancer Mother    Uterine cancer Mother    Lung cancer Father    HIV Brother        11   Kidney cancer Brother    Lung cancer Brother    Other Brother        Mouth Cancer   Colon cancer Maternal Aunt    Heart disease Maternal Grandmother    Stroke Maternal Grandmother    Hypertension Maternal Grandmother    Diabetes Maternal Grandmother    Arthritis Other    Esophageal cancer Neg Hx    Liver disease Neg Hx    Rectal cancer Neg Hx    Stomach cancer Neg Hx    Allergies  Allergen Reactions   Prednisone Other (See Comments)    Changed personality    Dilaudid [Hydromorphone] Itching and Other (See Comments)    Dilaudid caused marked confusion   Atrovent Hfa [Ipratropium Bromide Hfa] Itching and Other (See Comments)    Pt could not sleep   Cheratussin Ac [Guaifenesin-Codeine] Other (See Comments)    Headaches    Diltiazem Itching and Other (See Comments)    Makes patient sick. Shakes. GI   Hydrocodone Itching   Influenza Vaccines Other (See Comments)    Pt reports heart attack after last flu shot   Morphine Itching   Motrin [Ibuprofen] Itching and Other (See Comments)     "Gives false reading in blood"   Oxycodone Itching   Codeine Itching, Nausea Only and Other (See Comments)    Pt takes promethazine with codeine at home   Current Outpatient Medications  Medication Sig Dispense Refill Last Dose   aspirin EC 81 MG tablet Take 81 mg by mouth daily. Swallow whole.      benzonatate (TESSALON PERLES) 100 MG capsule Take 1 capsule (100 mg total) by mouth 3 (three) times daily as needed for cough. 20 capsule 0    ferrous sulfate 325 (65 FE) MG tablet Take 1 tablet (325 mg total) by mouth 2 (two) times daily with a meal. 60 tablet 2    loperamide (IMODIUM) 2 MG capsule Take 2 capsules (4 mg total) by mouth every 6 (six) hours as needed for diarrhea or loose stools (Use if >385mL ileostomy output every 6 hours). 90 capsule 3    methocarbamol (ROBAXIN) 500 MG tablet Take 1 tablet (500 mg total) by mouth every 6 (six) hours as needed for muscle spasms. 20 tablet 2    OFEV 100 MG CAPS Take 100 mg by mouth daily.  sodium chloride (OCEAN) 0.65 % SOLN nasal spray Place 1 spray into both nostrils as needed for congestion.      SYNTHROID 112 MCG tablet TAKE 1 TABLET BY MOUTH EVERY DAY BEFORE BREAKFAST 90 tablet 0    traMADol (ULTRAM) 50 MG tablet Take 1-2 tablets (50-100 mg total) by mouth every 6 (six) hours as needed for moderate pain. 20 tablet 0    vitamin C (ASCORBIC ACID) 500 MG tablet Take 500 mg by mouth daily.      No current facility-administered medications for this encounter.   ROS  Review of Systems  Constitutional:  Positive for appetite change and fatigue.  Genitourinary:        Urine output normal Clear and yellow  Skin:  Positive for color change and pallor.       Peristomal skin red  Neurological:  Positive for dizziness.  Psychiatric/Behavioral:  The patient is nervous/anxious.        Anxious about self care  All other systems reviewed and are negative.  Vital signs:  There were no vitals taken for this visit. Exam:  Physical  Exam Constitutional:      Appearance: Normal appearance.     Comments: Color is pale and feels weaker today  HENT:     Mouth/Throat:     Mouth: Mucous membranes are moist.     Comments: Denies dry mouth Abdominal:     Palpations: Abdomen is soft.  Skin:    General: Skin is warm and dry.     Coloration: Skin is pale.  Neurological:     Mental Status: She is alert and oriented to person, place, and time.  Psychiatric:        Mood and Affect: Mood normal.        Behavior: Behavior normal.     Stoma type/location:  LLQ ileostomy Stomal assessment/size:  oval  3 cm x 2.2 cm  Peristomal assessment:  intact but red today.  She has been having increased liquid output.  She denies any leaks, but there is effluent around the barrier when I remove it.   Treatment options for stomal/peristomal skin: barrier ring Output: liquid green stool Ostomy pouching: 2pc. Pouch with barrier ring, today I applied skin protectant to the irritated skin Education provided:  We discuss the risk for dehydration.  She has been drinking electrolyte replacement drinks and water.  Her urine remains clear and yellow.     Impression/dx  Ileostomy, increased output Discussion  Encourage fluids  She is eating well.  She can see her stoma now and should be able to take a more active role in her pouch changes.  She has a friend that assists her with pouch changes at home that is a Engineer, civil (consulting).  I have asked the patient to perform the pouch change with her friend watching so she can gain confidence with self care.   Plan   See back as needed    Visit time: 45 minutes.   Maple Hudson FNP-BC

## 2022-08-27 NOTE — Progress Notes (Signed)
Subjective:   By signing my name below, I, [name], attest that this documentation has been prepared under the direction and in the presence of Seabron Spates R, DO. 08/28/2022.   Patient ID: Alexandria [name], female    DOB: 07-Jul-1945, 77 y.o.   MRN: [id number]  Chief Complaint  Patient presents with   Dehydration    HPI Patient is in today for an office visit.  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  Currently, she complains of dizziness and notes that her ileostomy bag is filling with fluid when it should be filling with more of a sludge-like substance. She has had to empty the bag more frequently. Last night she emptied the bag 4 times from 11 PM to 2 AM. She has needed to empty it twice so far today. She has taken 2 Imodium, her potassium and iron today.   This morning she had discussed her situation with her surgeon. She reports that she needs IV electrolytes. We discussed that she will need to present to the ER for this.  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 the left ventricular wall motion is normal   Hypothyroidism    IPF (idiopathic pulmonary fibrosis) (HCC) 01/2018   Myocardial infarction (HCC)    hx of 30 years ago   Pancreatitis    Thyroid disease    TIA (transient ischemic attack)    8 years ago    Past Surgical History:  Procedure Laterality Date   BREAST BIOPSY Left    [facility]   CARDIAC CATHETERIZATION     10/2013    CATARACT EXTRACTION Bilateral 03/22/2018   FLEXIBLE SIGMOIDOSCOPY N/A 08/13/2022   Procedure: FLEXIBLE SIGMOIDOSCOPY;  Surgeon: Karie Soda, MD;  Location: [facility];  Service: General;  Laterality: N/A;   LEFT HEART CATHETERIZATION WITH CORONARY ANGIOGRAM N/A 10/25/2013   Procedure: LEFT HEART CATHETERIZATION WITH CORONARY ANGIOGRAM;  Surgeon: Lennette Bihari, MD;  Location: [facility] CATH LAB;  Service: Cardiovascular;  Laterality: N/A;   LYSIS OF ADHESION N/A 08/13/2022   Procedure: LYSIS OF ADHESION, DIVERTING ILEOSTOMY;  Surgeon: Karie Soda, MD;  Location: [facility];  Service: General;  Laterality: N/A;   NECK SURGERY     ACDF x 2      done in [place] more than 25 years, doesn't know levels   TOTAL KNEE ARTHROPLASTY     Bilateral x's 2   VAGINAL HYSTERECTOMY  10/17/1998   Konrad Dolores   VIDEO BRONCHOSCOPY Bilateral 01/24/2018   Procedure: VIDEO BRONCHOSCOPY WITH FLUORO;  Surgeon: Lupita Leash, MD;  Location: [facility]OSCOPY;  Service: Cardiopulmonary;  Laterality: Bilateral;   XI ROBOTIC ASSISTED COLOSTOMY TAKEDOWN N/A 08/13/2022   Procedure: ROBOTIC OSTOMY TAKEDOWN, SMALL BOWEL RESECTION X 2, BILATERAL TAP BLOCK, TISSUE PERFUSSION ASSESMENT VIA FIREFLY, MOBILAZATION OF SPLENIC FLEXURE;  Surgeon: Karie Soda, MD;  Location: [facility];  Service: General;  Laterality:  N/A;   XI ROBOTIC ASSISTED LOWER ANTERIOR RESECTION N/A 03/31/2022   Procedure: XI ROBOTIC ASSISTED LOWER ANTERIOR RESECTION;  Surgeon: Karie Soda, MD;  Location: [facility];  Service: General;  Laterality: N/A;    Family History  Problem Relation Age of Onset   Ovarian cancer Mother    Uterine cancer Mother    Lung cancer Father    HIV Brother        8   Kidney cancer Brother    Lung cancer Brother    Other Brother        Mouth Cancer   Colon cancer Maternal Aunt    Heart disease Maternal Grandmother    Stroke Maternal Grandmother    Hypertension Maternal Grandmother    Diabetes Maternal Grandmother    Arthritis Other     Esophageal cancer Neg Hx    Liver disease Neg Hx    Rectal cancer Neg Hx    Stomach cancer Neg Hx     Social History   Socioeconomic History   Marital status: Married    Spouse name: Tasia Catchings   Number of children: 3   Years of education: Jr college   Highest education level: Not on file  Occupational History   Occupation: Leisure centre manager: UNEMPLOYED   Occupation: retired  Tobacco Use   Smoking status: Never   Smokeless tobacco: Never  Building services engineer Use: Never used  Substance and Sexual Activity   Alcohol use: No    Alcohol/week: 0.0 standard drinks of alcohol   Drug use: No   Sexual activity: Not Currently  Other Topics Concern   Not on file  Social History Narrative   Lives with husband Tasia Catchings   Caffeine use: coffee (2 cups per day)   Mostly right-handed   Social Determinants of Health   Financial Resource Strain: Not on file  Food Insecurity: No Food Insecurity (08/24/2022)   Hunger Vital Sign    Worried About Running Out of Food in the Last Year: Never true    Ran Out of Food in the Last Year: Never true  Transportation Needs: No Transportation Needs (08/24/2022)   PRAPARE - Administrator, Civil Service (Medical): No    Lack of Transportation (Non-Medical): No  Physical Activity: Not on file  Stress: Not on file  Social Connections: Not on file  Intimate Partner Violence: Not At Risk (08/14/2022)   Humiliation, Afraid, Rape, and Kick questionnaire    Fear of Current or Ex-Partner: No    Emotionally Abused: No    Physically Abused: No    Sexually Abused: No    Outpatient Medications Prior to Visit  Medication Sig Dispense Refill   aspirin EC 81 MG tablet Take 81 mg by mouth daily. Swallow whole.     benzonatate (TESSALON PERLES) 100 MG capsule Take 1 capsule (100 mg total) by mouth 3 (three) times daily as needed for cough. 20 capsule 0   ferrous sulfate 325 (65 FE) MG tablet Take 1 tablet (325 mg total) by mouth 2 (two) times daily with  a meal. 60 tablet 2   loperamide (IMODIUM) 2 MG capsule Take 2 capsules (4 mg total) by mouth every 6 (six) hours as needed for diarrhea or loose stools (Use if >379mL ileostomy output every 6 hours). 90 capsule 3   OFEV 100 MG CAPS Take 100 mg by mouth daily.     sodium chloride (OCEAN) 0.65 % SOLN nasal spray Place 1 spray into  both nostrils as needed for congestion.     SYNTHROID 112 MCG tablet TAKE 1 TABLET BY MOUTH EVERY DAY BEFORE BREAKFAST 90 tablet 0   vitamin C (ASCORBIC ACID) 500 MG tablet Take 500 mg by mouth daily.     methocarbamol (ROBAXIN) 500 MG tablet Take 1 tablet (500 mg total) by mouth every 6 (six) hours as needed for muscle spasms. 20 tablet 2   traMADol (ULTRAM) 50 MG tablet Take 1-2 tablets (50-100 mg total) by mouth every 6 (six) hours as needed for moderate pain. 20 tablet 0   No facility-administered medications prior to visit.    Allergies  Allergen Reactions   Prednisone Other (See Comments)    Changed personality    Dilaudid [Hydromorphone] Itching and Other (See Comments)    Dilaudid caused marked confusion   Atrovent Hfa [Ipratropium Bromide Hfa] Itching and Other (See Comments)    Pt could not sleep   Cheratussin Ac [Guaifenesin-Codeine] Other (See Comments)    Headaches    Diltiazem Itching and Other (See Comments)    Makes patient sick. Shakes. GI   Hydrocodone Itching   Influenza Vaccines Other (See Comments)    Pt reports heart attack after last flu shot   Morphine Itching   Motrin [Ibuprofen] Itching and Other (See Comments)    "Gives false reading in blood"   Oxycodone Itching   Codeine Itching, Nausea Only and Other (See Comments)    Pt takes promethazine with codeine at home    Review of Systems  Constitutional:  Negative for fever and malaise/fatigue.  HENT:  Negative for congestion.   Eyes:  Negative for blurred vision.  Respiratory:  Negative for shortness of breath.   Cardiovascular:  Negative for chest pain, palpitations and leg  swelling.  Gastrointestinal:  Negative for abdominal pain, blood in stool and nausea.  Genitourinary:  Negative for dysuria and frequency.  Musculoskeletal:  Negative for falls.  Skin:  Negative for rash.  Neurological:  Positive for dizziness. Negative for loss of consciousness and headaches.  Endo/Heme/Allergies:  Negative for environmental allergies.  Psychiatric/Behavioral:  Negative for depression. The patient is not nervous/anxious.        Objective:    Physical Exam Vitals and nursing note reviewed.  Constitutional:      Appearance: Normal appearance.  HENT:     Head: Normocephalic and atraumatic.     Right Ear: Tympanic membrane, ear canal and external ear normal.     Left Ear: Tympanic membrane, ear canal and external ear normal.  Eyes:     Extraocular Movements: Extraocular movements intact.     Pupils: Pupils are equal, round, and reactive to light.  Cardiovascular:     Rate and Rhythm: Normal rate and regular rhythm.     Heart sounds: Normal heart sounds. No murmur heard.    No gallop.  Pulmonary:     Effort: Pulmonary effort is normal. No respiratory distress.     Breath sounds: Normal breath sounds. No wheezing or rales.  Skin:    General: Skin is warm and dry.  Neurological:     General: No focal deficit present.     Mental Status: She is alert and oriented to person, place, and time.  Psychiatric:        Mood and Affect: Mood normal.        Behavior: Behavior normal.     BP (!) 140/100 (BP Location: Left Arm, Patient Position: Sitting, Cuff Size: Normal)   Pulse (!) 54  Resp 20   Ht 5' 3.75" (1.619 m)   Wt 133 lb (60.3 kg)   SpO2 94%   BMI 23.01 kg/m  Wt Readings from Last 3 Encounters:  08/28/22 133 lb (60.3 kg)  08/19/22 143 lb 8.3 oz (65.1 kg)  08/03/22 139 lb (63 kg)    Diabetic Foot Exam - Simple   No data filed    Lab Results  Component Value Date   WBC 4.9 08/19/2022   HGB 11.4 (L) 08/19/2022   HCT 36.0 08/19/2022   PLT 307  08/19/2022   GLUCOSE 88 08/19/2022   CHOL 183 06/15/2022   TRIG 98 06/15/2022   HDL 42 06/15/2022   LDLCALC 123 (H) 06/15/2022   ALT 9 06/08/2022   AST 18 06/08/2022   NA 136 08/19/2022   K 3.6 08/19/2022   CL 103 08/19/2022   CREATININE 0.73 08/19/2022   BUN 6 (L) 08/19/2022   CO2 22 08/19/2022   TSH 0.99 01/02/2022   INR 1.2 03/30/2022   HGBA1C 5.4 04/05/2022    Lab Results  Component Value Date   TSH 0.99 01/02/2022   Lab Results  Component Value Date   WBC 4.9 08/19/2022   HGB 11.4 (L) 08/19/2022   HCT 36.0 08/19/2022   MCV 101.7 (H) 08/19/2022   PLT 307 08/19/2022   Lab Results  Component Value Date   NA 136 08/19/2022   K 3.6 08/19/2022   CO2 22 08/19/2022   GLUCOSE 88 08/19/2022   BUN 6 (L) 08/19/2022   CREATININE 0.73 08/19/2022   BILITOT 0.6 06/08/2022   ALKPHOS 72 06/08/2022   AST 18 06/08/2022   ALT 9 06/08/2022   PROT 6.9 06/08/2022   ALBUMIN 3.2 (L) 06/08/2022   CALCIUM 8.3 (L) 08/19/2022   ANIONGAP 11 08/19/2022   EGFR 79 05/14/2022   GFR 72.14 05/28/2022   Lab Results  Component Value Date   CHOL 183 06/15/2022   Lab Results  Component Value Date   HDL 42 06/15/2022   Lab Results  Component Value Date   LDLCALC 123 (H) 06/15/2022   Lab Results  Component Value Date   TRIG 98 06/15/2022   Lab Results  Component Value Date   CHOLHDL 4.4 06/15/2022   Lab Results  Component Value Date   HGBA1C 5.4 04/05/2022       Assessment & Plan:   Problem List Items Addressed This Visit       Unprioritized   Dizziness - Primary    Due to dehydration      Dehydration    To to er for eval         Meds ordered this encounter  Medications   traMADol (ULTRAM) 50 MG tablet    Sig: Take 1-2 tablets (50-100 mg total) by mouth every 6 (six) hours as needed for moderate pain.    Dispense:  20 tablet    Refill:  0   methocarbamol (ROBAXIN) 500 MG tablet    Sig: Take 1 tablet (500 mg total) by mouth every 6 (six) hours as needed  for muscle spasms.    Dispense:  20 tablet    Refill:  2    I, Donato Schultz, DO, personally preformed the services described in this documentation.  All medical record entries made by the scribe were at my direction and in my presence.  I have reviewed the chart and discharge instructions (if applicable) and agree that the record reflects my personal performance and is accurate  and complete. 08/28/2022.  I,Mathew Stumpf,acting as a Neurosurgeon for [employer], DO.,have documented all relevant documentation on the behalf of Donato Schultz, DO,as directed by  Donato Schultz, DO while in the presence of Donato Schultz, DO.   Donato Schultz, DO

## 2022-08-28 ENCOUNTER — Other Ambulatory Visit: Payer: Self-pay

## 2022-08-28 ENCOUNTER — Emergency Department (HOSPITAL_BASED_OUTPATIENT_CLINIC_OR_DEPARTMENT_OTHER): Payer: Medicare Other

## 2022-08-28 ENCOUNTER — Ambulatory Visit (INDEPENDENT_AMBULATORY_CARE_PROVIDER_SITE_OTHER): Payer: Medicare Other | Admitting: Family Medicine

## 2022-08-28 ENCOUNTER — Emergency Department (HOSPITAL_BASED_OUTPATIENT_CLINIC_OR_DEPARTMENT_OTHER)
Admission: EM | Admit: 2022-08-28 | Discharge: 2022-08-28 | Disposition: A | Discharge status: Home / Self Care | Payer: Medicare Other | Attending: Emergency Medicine | Admitting: Emergency Medicine

## 2022-08-28 ENCOUNTER — Encounter (HOSPITAL_BASED_OUTPATIENT_CLINIC_OR_DEPARTMENT_OTHER): Payer: Self-pay

## 2022-08-28 ENCOUNTER — Encounter: Payer: Self-pay | Admitting: Family Medicine

## 2022-08-28 VITALS — BP 140/100 | HR 54 | Resp 20 | Ht 63.75 in | Wt 133.0 lb

## 2022-08-28 DIAGNOSIS — N179 Acute kidney failure, unspecified: Secondary | ICD-10-CM | POA: Insufficient documentation

## 2022-08-28 DIAGNOSIS — R42 Dizziness and giddiness: Secondary | ICD-10-CM

## 2022-08-28 DIAGNOSIS — E860 Dehydration: Secondary | ICD-10-CM

## 2022-08-28 DIAGNOSIS — Z932 Ileostomy status: Secondary | ICD-10-CM | POA: Insufficient documentation

## 2022-08-28 DIAGNOSIS — Z7982 Long term (current) use of aspirin: Secondary | ICD-10-CM | POA: Diagnosis not present

## 2022-08-28 DIAGNOSIS — E871 Hypo-osmolality and hyponatremia: Secondary | ICD-10-CM | POA: Insufficient documentation

## 2022-08-28 DIAGNOSIS — Z0389 Encounter for observation for other suspected diseases and conditions ruled out: Secondary | ICD-10-CM | POA: Diagnosis not present

## 2022-08-28 DIAGNOSIS — Z79899 Other long term (current) drug therapy: Secondary | ICD-10-CM | POA: Insufficient documentation

## 2022-08-28 DIAGNOSIS — E039 Hypothyroidism, unspecified: Secondary | ICD-10-CM | POA: Insufficient documentation

## 2022-08-28 LAB — CBC WITH DIFFERENTIAL/PLATELET
Abs Immature Granulocytes: 0.05 10*3/uL (ref 0.00–0.07)
Basophils Absolute: 0.1 10*3/uL (ref 0.0–0.1)
Basophils Relative: 2 %
Eosinophils Absolute: 0.3 10*3/uL (ref 0.0–0.5)
Eosinophils Relative: 5 %
HCT: 41.8 % (ref 36.0–46.0)
Hemoglobin: 13.7 g/dL (ref 12.0–15.0)
Immature Granulocytes: 1 %
Lymphocytes Relative: 19 %
Lymphs Abs: 1.3 10*3/uL (ref 0.7–4.0)
MCH: 32.9 pg (ref 26.0–34.0)
MCHC: 32.8 g/dL (ref 30.0–36.0)
MCV: 100.5 fL — ABNORMAL HIGH (ref 80.0–100.0)
Monocytes Absolute: 0.8 10*3/uL (ref 0.1–1.0)
Monocytes Relative: 12 %
Neutro Abs: 4.2 10*3/uL (ref 1.7–7.7)
Neutrophils Relative %: 61 %
Platelets: 465 10*3/uL — ABNORMAL HIGH (ref 150–400)
RBC: 4.16 MIL/uL (ref 3.87–5.11)
RDW: 15.3 % (ref 11.5–15.5)
WBC: 6.7 10*3/uL (ref 4.0–10.5)
nRBC: 0 % (ref 0.0–0.2)

## 2022-08-28 LAB — MAGNESIUM: Magnesium: 2.2 mg/dL (ref 1.7–2.4)

## 2022-08-28 LAB — URINALYSIS, ROUTINE W REFLEX MICROSCOPIC
Bilirubin Urine: NEGATIVE
Glucose, UA: NEGATIVE mg/dL
Hgb urine dipstick: NEGATIVE
Ketones, ur: NEGATIVE mg/dL
Leukocytes,Ua: NEGATIVE
Nitrite: NEGATIVE
Protein, ur: NEGATIVE mg/dL
Specific Gravity, Urine: 1.015 (ref 1.005–1.030)
pH: 5.5 (ref 5.0–8.0)

## 2022-08-28 LAB — COMPREHENSIVE METABOLIC PANEL
ALT: 10 U/L (ref 0–44)
AST: 21 U/L (ref 15–41)
Albumin: 3.9 g/dL (ref 3.5–5.0)
Alkaline Phosphatase: 64 U/L (ref 38–126)
Anion gap: 10 (ref 5–15)
BUN: 11 mg/dL (ref 8–23)
CO2: 24 mmol/L (ref 22–32)
Calcium: 9.1 mg/dL (ref 8.9–10.3)
Chloride: 97 mmol/L — ABNORMAL LOW (ref 98–111)
Creatinine, Ser: 1.04 mg/dL — ABNORMAL HIGH (ref 0.44–1.00)
GFR, Estimated: 55 mL/min — ABNORMAL LOW (ref >60)
Glucose, Bld: 99 mg/dL (ref 70–99)
Potassium: 4.5 mmol/L (ref 3.5–5.1)
Sodium: 131 mmol/L — ABNORMAL LOW (ref 135–145)
Total Bilirubin: 0.4 mg/dL (ref 0.3–1.2)
Total Protein: 8.5 g/dL — ABNORMAL HIGH (ref 6.5–8.1)

## 2022-08-28 MED ORDER — TRAMADOL HCL 50 MG PO TABS: 50.0000 mg | ORAL_TABLET | Freq: Four times a day (QID) | ORAL | 0 refills | Status: DC | PRN

## 2022-08-28 MED ORDER — SODIUM CHLORIDE 0.9 % IV BOLUS
1000.0000 mL | Freq: Once | INTRAVENOUS | Status: AC
  Administered 2022-08-28: 1000 mL via INTRAVENOUS

## 2022-08-28 MED ORDER — METHOCARBAMOL 500 MG PO TABS: 500.0000 mg | ORAL_TABLET | Freq: Four times a day (QID) | ORAL | 2 refills | Status: DC | PRN

## 2022-08-28 NOTE — Assessment & Plan Note (Signed)
To to er for eval

## 2022-08-28 NOTE — Discharge Instructions (Addendum)
I have spoken with general surgery where you had your ostomy revised.  They recommend that you hydrate yourself with water, not just Gatorade.  Recommends taking Imodium 2 mg 4 times a day at a scheduled time.  You can take up to 4 mg 4 times a day if your ostomy output remains high.  They asked that you are keeping a log of your ostomy output as well.  Please follow-up with general surgery and schedule an appointment.  If you have any worsening ostomy output, fevers, abdominal pain, please return to the nearest emergency room for evaluation.  If any concerns with new or worsening symptoms, please return to the nearest emergency room for evaluation.   Contact a doctor if: You have pain in your belly (abdomen) and the pain: Gets worse. Stays in one place. You have a rash. You have a stiff neck. You get angry or annoyed (irritable) more easily than normal. You are more tired or have a harder time waking than normal. You feel weak or dizzy. You feel very thirsty. Get help right away if: You have symptoms of very bad dehydration. You have a fever. You have a very bad headache. You have vomiting that gets worse or does not go away. Or if: There is blood or green matter in your vomit. You cannot eat or drink without vomiting. You have diarrhea that gets worse or does not go away. You have blood in your stool (feces). This may cause stool to look black and tarry. You have dark pee or no pee. Or you pee only a little in 6-8 hours. You have trouble breathing. You have symptoms that get worse with treatment. These symptoms may be an emergency. Get help right away. Call 911. Do not wait to see if the symptoms will go away. Do not drive yourself to the hospital.

## 2022-08-28 NOTE — ED Triage Notes (Signed)
Patient had a new colostomy placed 5/11. She stated the bad is filling up with a lot of liquid. Her PCP sent her here for fluids. She is having some weakness but no other symptoms.

## 2022-08-28 NOTE — ED Provider Notes (Signed)
[place] [facility] Provider Note   CSN: [id number] Arrival date & time: 08/28/22  1150     History Chief Complaint  Patient presents with   Dehydration    Alexandria [name] is a 77 y.o. female with h/o hypothyroidism and recent ostomy revision presents to the ER for evaluation of dehydration.  Patient reports that she had a revision ostomy on 08-15-2022 by Dr. Michaell Cowing.  Since then, she reports that she has had increased output from her ostomy.  She reports that she is having to get up around 3-4 times a night to empty out her bag as well as burping occasionally as well.  She reports that she have to empty around 3-4 times in the daytime as well.  This is becoming hindrance to her as she is waking up multiple times in the middle of the night to empty this.  She reports that she has been having some nausea but mainly more decrease in appetite.  No vomiting.  No fevers or any hematochezia appreciated.  She reports that occasionally she will have darker output of her stool however this is normal for her given her iron.  She is not feeling more lightheaded/"woozy".  Denies any room spinning sensation.  Denies any unilateral weakness or any trouble walking or talking.  She reports her abdominal pain is at her baseline.  She called her surgery center to tell them about her issues and recommended that she come to the ER for IV fluids.  HPI     Home Medications Prior to Admission medications   Medication Sig Start Date End Date Taking? Authorizing Provider  aspirin EC 81 MG tablet Take 81 mg by mouth daily. Swallow whole.    [provider]  benzonatate (TESSALON PERLES) 100 MG capsule Take 1 capsule (100 mg total) by mouth 3 (three) times daily as needed for cough. 07/17/22   Donato Schultz, DO  ferrous sulfate 325 (65 FE) MG tablet Take 1 tablet (325 mg total) by mouth 2 (two) times daily with a meal. 08/19/22   Karie Soda, MD  loperamide  (IMODIUM) 2 MG capsule Take 2 capsules (4 mg total) by mouth every 6 (six) hours as needed for diarrhea or loose stools (Use if >336mL ileostomy output every 6 hours). 08/19/22   Karie Soda, MD  methocarbamol (ROBAXIN) 500 MG tablet Take 1 tablet (500 mg total) by mouth every 6 (six) hours as needed for muscle spasms. 08/28/22   Lowne Chase, Yvonne R, DO  OFEV 100 MG CAPS Take 100 mg by mouth daily. 06/19/22   [provider]  sodium chloride (OCEAN) 0.65 % SOLN nasal spray Place 1 spray into both nostrils as needed for congestion.    [provider]  SYNTHROID 112 MCG tablet TAKE 1 TABLET BY MOUTH EVERY DAY BEFORE BREAKFAST 08/21/22   Zola Button, Grayling Congress, DO  traMADol (ULTRAM) 50 MG tablet Take 1-2 tablets (50-100 mg total) by mouth every 6 (six) hours as needed for moderate pain. 08/28/22   Donato Schultz, DO  vitamin C (ASCORBIC ACID) 500 MG tablet Take 500 mg by mouth daily.    [provider]      Allergies    Prednisone, Dilaudid [hydromorphone], Atrovent hfa [ipratropium bromide hfa], Cheratussin ac [guaifenesin-codeine], Diltiazem, Hydrocodone, Influenza vaccines, Morphine, Motrin [ibuprofen], Oxycodone, and Codeine    Review of Systems   Review of Systems  Constitutional:  Positive for appetite change and fatigue. Negative  for chills and fever.  Respiratory:  Negative for shortness of breath.   Cardiovascular:  Negative for chest pain.  Gastrointestinal:  Positive for abdominal pain (At baseline) and nausea. Negative for vomiting.       Increased GI output  Neurological:  Positive for light-headedness. Negative for dizziness, syncope, weakness and headaches.    Physical Exam Updated Vital Signs BP (!) 172/76   Pulse 72   Temp 97.9 F (36.6 C) (Oral)   Resp 18   Ht 5\' 3"  (1.6 m)   Wt 60.3 kg   SpO2 94%   BMI 23.56 kg/m  Physical Exam Vitals and nursing note reviewed.  Constitutional:      General: She is not in acute distress.     Appearance: Normal appearance. She is not ill-appearing or toxic-appearing.  HENT:     Mouth/Throat:     Mouth: Mucous membranes are moist.  Eyes:     General: No scleral icterus. Cardiovascular:     Rate and Rhythm: Normal rate and regular rhythm.  Pulmonary:     Effort: Pulmonary effort is normal.     Breath sounds: Normal breath sounds.  Abdominal:     General: Bowel sounds are normal.     Palpations: Abdomen is soft.     Tenderness: There is abdominal tenderness. There is no guarding or rebound.     Comments: Tenderness mainly to the right side of the abdomen.  Patient reports is chronic for her and no worsening.  Soft without any guarding or rebound.  Normal active bowel sounds.  Ostomy noted in the left lower to mid quadrant.  Appears to have some green output, no melena or hematochezia appreciated.  No leak around the bag appreciated.  Musculoskeletal:        General: No deformity.     Cervical back: Normal range of motion.  Skin:    General: Skin is warm and dry.  Neurological:     General: No focal deficit present.     Mental Status: She is alert. Mental status is at baseline.     Cranial Nerves: No cranial nerve deficit.     Sensory: No sensory deficit.     Motor: No weakness.     Gait: Gait normal.     ED Results / Procedures / Treatments   Labs (all labs ordered are listed, but only abnormal results are displayed) Labs Reviewed  CBC WITH DIFFERENTIAL/PLATELET - Abnormal; Notable for the following components:      Result Value   MCV 100.5 (*)    Platelets 465 (*)    All other components within normal limits  COMPREHENSIVE METABOLIC PANEL - Abnormal; Notable for the following components:   Sodium 131 (*)    Chloride 97 (*)    Creatinine, Ser 1.04 (*)    Total Protein 8.5 (*)    GFR, Estimated 55 (*)    All other components within normal limits  URINALYSIS, ROUTINE W REFLEX MICROSCOPIC  MAGNESIUM    EKG None  Radiology DG Abdomen 1 View  Result Date:  08/28/2022 CLINICAL DATA:  High output with colostomy bag. EXAM: ABDOMEN - 1 VIEW COMPARISON:  April 08, 2022. FINDINGS: The bowel gas pattern is normal. Postsurgical changes are noted in the pelvis. No significant stool burden is noted. IMPRESSION: No abnormal bowel dilatation. Electronically Signed   By: Lupita Raider M.D.   On: 08/28/2022 15:39    Procedures Procedures   Medications Ordered in ED Medications  sodium chloride  0.9 % bolus 1,000 mL (0 mLs Intravenous Stopped 08/28/22 1554)  sodium chloride 0.9 % bolus 1,000 mL (0 mLs Intravenous Stopped 08/28/22 1641)    ED Course/ Medical Decision Making/ A&P   {                           Medical Decision Making Amount and/or Complexity of Data Reviewed Labs: ordered. Radiology: ordered.   77 y.o. female presents to the ER for evaluation of dehydration with increase ostomy output and decrease PO intake. Differential diagnosis includes but is not limited to dehydration, electrolyte abnormality, obstruction. Vital signs elevated blood pressure otherwise unremarkable. Physical exam as noted above.   I independently reviewed and interpreted the patient's labs.  Urinalysis unremarkable.  Magnesium within normal limits.  CBC without cytosis or anemia.  She does have elevated platelets of 465 which is new for her.  Could be some inflammation.  CMP does show mildly decreased sodium 131 with mildly decreased chloride at 97.  Creatinine is elevated at 1.04 from 0.73 previous.  Total protein mildly elevated as well.  Does meet complications for AKI, will order 1 L IV fluids.    KUB shows no abnormal bowel dilatation.  Consulted [facility], general surgery, and spoke with Bailey Mech PA-C. She consulted and reviewed the case with her attending, Dr. Magnus Ivan. He reports that she is safe for discharge home. Per secure chat " Magnus Ivan says: ok to go home. encourage PO hydration with WATER, not just Gatorade. start imodium 2 mg QID, scheduled. can take up to 4  mg QID if ostomy output remains high. please give her another one liter of fluid prior to sending her out."  Overall, she was given 2L NS. Her abdominal pain is at it's baseline and her abdomen is soft without guarding or rebound. I do not appreciated any significant melena or blood from ostomy bag. Vital signs show elevated BP, but afebrile and without tachycardia. She has tolerated PO here. She will likely resolve her AKI outpatient with increase PO fluid intake as well as the 2L she received here. I have a lower suspicion for any bowel obstruction given increase in stool output. She also denies any worsening of belly pain. Safe for discharge home with surgery follow up.   We discussed the results of the labs/imaging. The plan is increase PO fluid intake and scheduled immodium.  She already has Imodium sent to her pharmacy with refills from her recent admission.  We discussed strict return precautions and red flag symptoms. The patient verbalized their understanding and agrees to the plan. The patient is stable and being discharged home in good condition.  I discussed this case with my attending physician who cosigned this note including patient's presenting symptoms, physical exam, and planned diagnostics and interventions. Attending physician stated agreement with plan or made changes to plan which were implemented.   Portions of this report may have been transcribed using voice recognition software. Every effort was made to ensure accuracy; however, inadvertent computerized transcription errors may be present.   Final Clinical Impression(s) / ED Diagnoses Final diagnoses:  Dehydration  AKI (acute kidney injury) (HCC)  Hyponatremia  Ileostomy in place [facility])    Rx / DC Orders ED Discharge Orders     None         Achille Rich, PA-C 08/29/22 1441    Rozelle Logan, DO 08/29/22 1515

## 2022-08-28 NOTE — Assessment & Plan Note (Signed)
Due to dehydration 

## 2022-09-01 ENCOUNTER — Ambulatory Visit (HOSPITAL_COMMUNITY)
Admission: RE | Admit: 2022-09-01 | Discharge: 2022-09-01 | Disposition: A | Discharge status: Home / Self Care | Payer: Medicare Other | Source: Ambulatory Visit | Attending: Family Medicine | Admitting: Family Medicine

## 2022-09-01 DIAGNOSIS — Z432 Encounter for attention to ileostomy: Secondary | ICD-10-CM | POA: Diagnosis not present

## 2022-09-01 DIAGNOSIS — N179 Acute kidney failure, unspecified: Secondary | ICD-10-CM | POA: Diagnosis not present

## 2022-09-01 DIAGNOSIS — E860 Dehydration: Secondary | ICD-10-CM | POA: Diagnosis not present

## 2022-09-01 DIAGNOSIS — Z932 Ileostomy status: Secondary | ICD-10-CM | POA: Diagnosis not present

## 2022-09-01 NOTE — Progress Notes (Signed)
[facility]   Reason for visit:  LLQ ileostomy recently received 2L IV bolus due to dehydration and AKI.   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 the left ventricular wall motion is normal   Hypothyroidism    IPF (idiopathic pulmonary fibrosis) (HCC) 01/2018   Myocardial infarction (HCC)    hx of 30 years ago   Pancreatitis    Thyroid disease    TIA (transient ischemic attack)    8 years ago   Family History  Problem Relation Age of Onset   Ovarian cancer Mother    Uterine cancer Mother    Lung cancer Father    HIV Brother        71   Kidney cancer Brother    Lung cancer Brother    Other Brother        Mouth Cancer   Colon cancer Maternal Aunt    Heart disease Maternal Grandmother    Stroke Maternal Grandmother    Hypertension Maternal Grandmother    Diabetes Maternal Grandmother    Arthritis Other    Esophageal cancer Neg Hx    Liver disease Neg Hx    Rectal cancer Neg Hx    Stomach cancer Neg Hx    Allergies  Allergen Reactions   Prednisone Other (See Comments)    Changed personality    Dilaudid [Hydromorphone] Itching and Other (See Comments)    Dilaudid caused marked confusion   Atrovent Hfa [Ipratropium Bromide Hfa] Itching and Other (See Comments)    Pt could not sleep   Cheratussin Ac [Guaifenesin-Codeine] Other (See Comments)    Headaches    Diltiazem Itching and Other (See Comments)    Makes patient sick. Shakes. GI   Hydrocodone Itching   Influenza Vaccines Other (See Comments)    Pt reports  heart attack after last flu shot   Morphine Itching   Motrin [Ibuprofen] Itching and Other (See Comments)    "Gives false reading in blood"   Oxycodone Itching   Codeine Itching, Nausea Only and Other (See Comments)    Pt takes promethazine with codeine at home   Current Outpatient Medications  Medication Sig Dispense Refill Last Dose   aspirin EC 81 MG tablet Take 81 mg by mouth daily. Swallow whole.      benzonatate (TESSALON PERLES) 100 MG capsule Take 1 capsule (100 mg total) by mouth 3 (three) times daily as needed for cough. 20 capsule 0    ferrous sulfate 325 (65 FE) MG tablet Take 1 tablet (325 mg total) by mouth 2 (two) times daily with a meal. 60 tablet 2    loperamide (IMODIUM) 2 MG capsule Take 2 capsules (4 mg total) by mouth every 6 (six) hours as needed for diarrhea or loose stools (Use if >375mL ileostomy output every 6 hours). 90 capsule 3    methocarbamol (ROBAXIN) 500 MG tablet Take 1 tablet (500 mg total) by mouth every 6 (six) hours as needed for muscle spasms.  20 tablet 2    OFEV 100 MG CAPS Take 100 mg by mouth daily.      sodium chloride (OCEAN) 0.65 % SOLN nasal spray Place 1 spray into both nostrils as needed for congestion.      SYNTHROID 112 MCG tablet TAKE 1 TABLET BY MOUTH EVERY DAY BEFORE BREAKFAST 90 tablet 0    traMADol (ULTRAM) 50 MG tablet Take 1-2 tablets (50-100 mg total) by mouth every 6 (six) hours as needed for moderate pain. 20 tablet 0    vitamin C (ASCORBIC ACID) 500 MG tablet Take 500 mg by mouth daily.      No current facility-administered medications for this encounter.   ROS  Review of Systems  Constitutional:  Positive for appetite change.       Denies dizziness Increased strength and appetite  Gastrointestinal:        LLQ ileostomy  Skin:  Positive for color change.  Neurological:  Positive for weakness. Negative for dizziness.  Psychiatric/Behavioral: Negative.    All other systems reviewed and are negative.  Vital signs:  BP (!)  143/81 (BP Location: Right Arm)   Pulse 75   Temp 98.1 F (36.7 C) (Oral)   Resp 18   SpO2 95%  Exam:  Physical Exam Vitals reviewed.  Constitutional:      Appearance: Normal appearance.  HENT:     Mouth/Throat:     Mouth: Mucous membranes are moist.  Abdominal:     Palpations: Abdomen is soft.  Skin:    General: Skin is warm and dry.  Neurological:     Mental Status: She is alert and oriented to person, place, and time.  Psychiatric:        Mood and Affect: Mood normal.        Behavior: Behavior normal.     Stoma type/location:  LLQ ileostomy Stomal assessment/size:  oval loop stoma  3 cm x 2.2 cm  Peristomal assessment:  intact Treatment options for stomal/peristomal skin: she changed pouch independently. States pouch just "popped off"  Output: liquid stool.  She is tracking her I & O Ostomy pouching: 2pc.  Education provided:  She states she drank only gatorade for 2 days prior to visit to ED for dehydration.  She was told to stop gatorade and drink water.      Impression/dx  Dehydration, resolved Ileostomy Discussion  Dehydration, written materials given regarding diet with an ileostomy.  Plan  Increase independence with ostomy care.  See back as needed .    Visit time: 30 minutes.   Maple Hudson FNP-BC

## 2022-09-02 NOTE — Discharge Instructions (Signed)
Continue water for hydration.  Track ostomy output, write in journal

## 2022-09-04 ENCOUNTER — Ambulatory Visit (HOSPITAL_COMMUNITY)
Admission: RE | Admit: 2022-09-04 | Discharge: 2022-09-04 | Disposition: A | Discharge status: Home / Self Care | Payer: Medicare Other | Source: Ambulatory Visit | Attending: Nurse Practitioner | Admitting: Nurse Practitioner

## 2022-09-04 DIAGNOSIS — K5780 Diverticulitis of intestine, part unspecified, with perforation and abscess without bleeding: Secondary | ICD-10-CM | POA: Insufficient documentation

## 2022-09-04 DIAGNOSIS — Z932 Ileostomy status: Secondary | ICD-10-CM | POA: Insufficient documentation

## 2022-09-04 DIAGNOSIS — R42 Dizziness and giddiness: Secondary | ICD-10-CM | POA: Insufficient documentation

## 2022-09-04 DIAGNOSIS — E860 Dehydration: Secondary | ICD-10-CM

## 2022-09-04 NOTE — Progress Notes (Signed)
[facility]   Reason for visit:  LLQ ileostomy, dizziness.   Is drinking water, but soda as well.  Urine is clear and light yellow.  HPI:  Diverticulitis with abscess, post poned re-anastomosis with diverting loop ileostomy Past Medical History:  Diagnosis Date  . Arthritis   . Bronchial pneumonia   . 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 Colon polyp   . Diverticulitis   . DVT (deep venous thrombosis) (HCC)    lower extremity  . H/O cardiac catheterization 2004   Normal coronary arteries  . Heart murmur   . History of stress test 05/21/2011  . Hx of echocardiogram 01/27/2010   Normal Ef 55% the transmitral spectral doppler flow pattern is normal for age. the left ventricular wall motion is normal  . Hypothyroidism   . IPF (idiopathic pulmonary fibrosis) (HCC) 01/2018  . Myocardial infarction (HCC)    hx of 30 years ago  . Pancreatitis   . Thyroid disease   . TIA (transient ischemic attack)    8 years ago   Family History  Problem Relation Age of Onset  . Ovarian cancer Mother   . Uterine cancer Mother   . Lung cancer Father   . HIV Brother        42  . Kidney cancer Brother   . Lung cancer Brother   . Other Brother        Mouth Cancer  . Colon cancer Maternal Aunt   . Heart disease Maternal Grandmother   . Stroke Maternal Grandmother   . Hypertension Maternal Grandmother   . Diabetes Maternal Grandmother   . Arthritis Other   . Esophageal cancer Neg Hx   . Liver disease Neg Hx   . Rectal cancer Neg Hx   . Stomach cancer Neg Hx    Allergies  Allergen Reactions  . Prednisone Other (See Comments)    Changed personality   . Dilaudid [Hydromorphone] Itching and Other (See Comments)    Dilaudid caused marked confusion  . Atrovent Hfa [Ipratropium Bromide Hfa] Itching and Other (See Comments)    Pt could not sleep  . Cheratussin Ac [Guaifenesin-Codeine] Other (See Comments)    Headaches   . Diltiazem Itching and Other (See Comments)    Makes patient  sick. Shakes. GI  . Hydrocodone Itching  . Influenza Vaccines Other (See Comments)    Pt reports heart attack after last flu shot  . Morphine Itching  . Motrin [Ibuprofen] Itching and Other (See Comments)    "Gives false reading in blood"  . Oxycodone Itching  . Codeine Itching, Nausea Only and Other (See Comments)    Pt takes promethazine with codeine at home   Current Outpatient Medications  Medication Sig Dispense Refill Last Dose  . aspirin EC 81 MG tablet Take 81 mg by mouth daily. Swallow whole.     . benzonatate (TESSALON PERLES) 100 MG capsule Take 1 capsule (100 mg total) by mouth 3 (three) times daily as needed for cough. 20 capsule 0   . ferrous sulfate 325 (65 FE) MG tablet Take 1 tablet (325 mg total) by mouth 2 (two) times daily with a meal. 60 tablet 2   . loperamide (IMODIUM) 2 MG capsule Take 2 capsules (4 mg total) by mouth every 6 (six) hours as needed for diarrhea or loose stools (Use if >382mL ileostomy output every 6 hours). 90 capsule 3   . methocarbamol (ROBAXIN) 500 MG tablet Take 1 tablet (500 mg total) by mouth  every 6 (six) hours as needed for muscle spasms. 20 tablet 2   . OFEV 100 MG CAPS Take 100 mg by mouth daily.     . sodium chloride (OCEAN) 0.65 % SOLN nasal spray Place 1 spray into both nostrils as needed for congestion.     Marland Kitchen SYNTHROID 112 MCG tablet TAKE 1 TABLET BY MOUTH EVERY DAY BEFORE BREAKFAST 90 tablet 0   . traMADol (ULTRAM) 50 MG tablet Take 1-2 tablets (50-100 mg total) by mouth every 6 (six) hours as needed for moderate pain. 20 tablet 0   . vitamin C (ASCORBIC ACID) 500 MG tablet Take 500 mg by mouth daily.      No current facility-administered medications for this encounter.   ROS  Review of Systems  Constitutional:  Positive for fever.  Gastrointestinal:        LLQ ileostomy  Endocrine: Positive for polydipsia.  Skin: Negative.   Psychiatric/Behavioral: Negative.    All other systems reviewed and are negative.  Vital signs:  BP  (!) 173/83   Temp 97.7 F (36.5 C) (Oral)   Resp 18   SpO2 96%  Exam:  Physical Exam Vitals reviewed.  Constitutional:      Appearance: Normal appearance.  HENT:     Mouth/Throat:     Mouth: Mucous membranes are moist.  Abdominal:     General: Abdomen is flat.     Palpations: Abdomen is soft.  Skin:    General: Skin is warm and dry.  Neurological:     Mental Status: She is alert and oriented to person, place, and time.  Psychiatric:        Mood and Affect: Mood normal.        Behavior: Behavior normal.    Stoma type/location:  LLQ ileostomy, oval Stomal assessment/size:  Oval,  Peristomal assessment:  3 cm x 2.2 cm pink and moist Treatment options for stomal/peristomal skin: barrier ring and 2 piece pouch  no leaks reported Output: liquid brown stool Ostomy pouching: 2pc.  Education provided:  She has difficulty visualizing stoma.  IS gaining confidence in self care.     Impression/dx  ileostomy Discussion  See back as needed, continue to perform pouch changes when leaking and twice weekly.  Plan  Back as needed for ongoing teaching, monitoring for dehydration. Is journaling her intake and output.    Visit time: 45 minutes.   Maple Hudson FNP-BC

## 2022-09-07 ENCOUNTER — Ambulatory Visit (HOSPITAL_COMMUNITY)
Admission: RE | Admit: 2022-09-07 | Discharge: 2022-09-07 | Disposition: A | Discharge status: Home / Self Care | Payer: Medicare Other | Source: Ambulatory Visit

## 2022-09-08 DIAGNOSIS — K5720 Diverticulitis of large intestine with perforation and abscess without bleeding: Secondary | ICD-10-CM | POA: Diagnosis not present

## 2022-09-08 NOTE — Discharge Instructions (Signed)
Continue to drink water. Monitor for dry mouth, dizziness and dark urine with decreased amount Document intake and output. Pouch changes twice weekly and if leaking

## 2022-09-11 ENCOUNTER — Ambulatory Visit (HOSPITAL_COMMUNITY)
Admission: RE | Admit: 2022-09-11 | Discharge: 2022-09-11 | Disposition: A | Discharge status: Home / Self Care | Payer: Medicare Other | Source: Ambulatory Visit | Attending: Family Medicine | Admitting: Family Medicine

## 2022-09-11 DIAGNOSIS — Z9189 Other specified personal risk factors, not elsewhere classified: Secondary | ICD-10-CM

## 2022-09-11 DIAGNOSIS — K9413 Enterostomy malfunction: Secondary | ICD-10-CM

## 2022-09-11 DIAGNOSIS — Z433 Encounter for attention to colostomy: Secondary | ICD-10-CM | POA: Diagnosis not present

## 2022-09-11 NOTE — Progress Notes (Signed)
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 the left ventricular wall motion is normal   Hypothyroidism    IPF (idiopathic pulmonary fibrosis) (HCC) 01/2018   Myocardial infarction (HCC)    hx of 30 years ago   Pancreatitis    Thyroid disease    TIA (transient ischemic attack)    8 years ago   Family History  Problem Relation Age of Onset   Ovarian cancer Mother    Uterine cancer Mother    Lung cancer Father    HIV Brother        19   Kidney cancer Brother    Lung cancer Brother    Other Brother        Mouth Cancer   Colon cancer Maternal Aunt    Heart disease Maternal Grandmother    Stroke Maternal Grandmother    Hypertension Maternal Grandmother    Diabetes Maternal Grandmother    Arthritis Other    Esophageal cancer Neg Hx    Liver disease Neg Hx    Rectal cancer Neg Hx    Stomach cancer Neg Hx    Allergies  Allergen Reactions   Prednisone Other (See Comments)    Changed personality    Dilaudid [Hydromorphone] Itching and Other (See Comments)    Dilaudid caused marked confusion   Atrovent Hfa [Ipratropium Bromide Hfa] Itching and Other (See Comments)    Pt could not sleep   Cheratussin Ac [Guaifenesin-Codeine] Other (See Comments)    Headaches    Diltiazem Itching and Other (See Comments)    Makes patient sick. Shakes. GI   Hydrocodone Itching   Influenza Vaccines Other (See Comments)    Pt reports heart  attack after last flu shot   Morphine Itching   Motrin [Ibuprofen] Itching and Other (See Comments)    "Gives false reading in blood"   Oxycodone Itching   Codeine Itching, Nausea Only and Other (See Comments)    Pt takes promethazine with codeine at home   Current Outpatient Medications  Medication Sig Dispense Refill Last Dose   aspirin EC 81 MG tablet Take 81 mg by mouth daily. Swallow whole.      benzonatate (TESSALON PERLES) 100 MG capsule Take 1 capsule (100 mg total) by mouth 3 (three) times daily as needed for cough. 20 capsule 0    ferrous sulfate 325 (65 FE) MG tablet Take 1 tablet (325 mg total) by mouth 2 (two) times daily with a meal. 60 tablet 2    loperamide (IMODIUM) 2 MG capsule Take 2 capsules (4 mg total) by mouth every 6 (six) hours as needed for diarrhea or loose stools (Use if >362mL ileostomy output every 6 hours). 90 capsule 3    methocarbamol (ROBAXIN) 500 MG tablet Take 1 tablet (500 mg total) by mouth every 6 (six) hours as needed for muscle spasms. 20 tablet 2  OFEV 100 MG CAPS Take 100 mg by mouth daily.      sodium chloride (OCEAN) 0.65 % SOLN nasal spray Place 1 spray into both nostrils as needed for congestion.      SYNTHROID 112 MCG tablet TAKE 1 TABLET BY MOUTH EVERY DAY BEFORE BREAKFAST 90 tablet 0    traMADol (ULTRAM) 50 MG tablet Take 1-2 tablets (50-100 mg total) by mouth every 6 (six) hours as needed for moderate pain. 20 tablet 0    vitamin C (ASCORBIC ACID) 500 MG tablet Take 500 mg by mouth daily.      No current facility-administered medications for this encounter.   ROS  Review of Systems  Constitutional:  Positive for fever.  Gastrointestinal:        LLQ ileostomy  Skin:  Positive for color change and pallor.       Peristomal redness   Vital signs:  BP 130/60 (BP Location: Right Arm)   Pulse 84   Temp 98.5 F (36.9 C) (Oral)   Resp 16   SpO2 94%  Exam:  Physical Exam Vitals reviewed.  Constitutional:      Appearance: Normal  appearance.  HENT:     Mouth/Throat:     Mouth: Mucous membranes are moist.     Comments: Complains of dry mouth at times Abdominal:     Palpations: Abdomen is soft.  Skin:    General: Skin is warm and dry.     Coloration: Skin is pale.  Neurological:     Mental Status: She is alert and oriented to person, place, and time.  Psychiatric:        Mood and Affect: Mood normal.        Behavior: Behavior normal.     Stoma type/location:  LLQ colostomy Stomal assessment/size:  oval  3 cm x 1.5 cm pink and moist Peristomal assessment:  intact  some erythema on bottom half.  Treatment options for stomal/peristomal skin: barrier ring and 2 piece pouch Output: soft brown stool Ostomy pouching: 2pc.  Education provided:  patient continues to increase independence with pouch care.  She is very concerned about dizziness, dry mouth and pallor.  Dr Michaell Cowing obtained labs and she is in an acceptable range for now.  She continues to sip liquids and eat as tolerated.     Impression/dx  Ileostomy Dehydration risk Discussion  See back weekly to assess hydration, ostomy pouching Plan  Sees Dr gross again next week. Will call office as needed for appointment.      Visit time: 45 minutes.   Maple Hudson FNP-BC

## 2022-09-14 ENCOUNTER — Ambulatory Visit (HOSPITAL_COMMUNITY)
Admission: RE | Admit: 2022-09-14 | Discharge: 2022-09-14 | Disposition: A | Discharge status: Home / Self Care | Payer: Medicare Other | Source: Ambulatory Visit | Attending: Nurse Practitioner | Admitting: Nurse Practitioner

## 2022-09-14 ENCOUNTER — Ambulatory Visit: Payer: Self-pay | Admitting: Surgery

## 2022-09-14 DIAGNOSIS — Z432 Encounter for attention to ileostomy: Secondary | ICD-10-CM | POA: Diagnosis not present

## 2022-09-14 DIAGNOSIS — Z932 Ileostomy status: Secondary | ICD-10-CM | POA: Diagnosis not present

## 2022-09-14 DIAGNOSIS — R42 Dizziness and giddiness: Secondary | ICD-10-CM | POA: Insufficient documentation

## 2022-09-14 DIAGNOSIS — K9413 Enterostomy malfunction: Secondary | ICD-10-CM | POA: Insufficient documentation

## 2022-09-14 DIAGNOSIS — Z9189 Other specified personal risk factors, not elsewhere classified: Secondary | ICD-10-CM

## 2022-09-14 NOTE — Progress Notes (Signed)
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 the left ventricular wall motion is normal   Hypothyroidism    IPF (idiopathic pulmonary fibrosis) (HCC) 01/2018   Myocardial infarction (HCC)    hx of 30 years ago   Pancreatitis    Thyroid disease    TIA (transient ischemic attack)    8 years ago   Family History  Problem Relation Age of Onset   Ovarian cancer Mother    Uterine cancer Mother    Lung cancer Father    HIV Brother        87   Kidney cancer Brother    Lung cancer Brother    Other Brother        Mouth Cancer   Colon cancer Maternal Aunt    Heart disease Maternal Grandmother    Stroke Maternal Grandmother    Hypertension Maternal Grandmother    Diabetes Maternal Grandmother    Arthritis Other    Esophageal cancer Neg Hx    Liver disease Neg Hx    Rectal cancer Neg Hx    Stomach cancer Neg Hx    Allergies  Allergen Reactions   Prednisone Other (See Comments)    Changed personality    Dilaudid [Hydromorphone] Itching and Other (See Comments)    Dilaudid caused marked confusion   Atrovent Hfa [Ipratropium Bromide Hfa] Itching and Other (See Comments)    Pt could not sleep   Cheratussin Ac [Guaifenesin-Codeine] Other (See Comments)    Headaches    Diltiazem Itching and Other (See Comments)    Makes patient sick. Shakes. GI   Hydrocodone Itching   Influenza Vaccines Other (See Comments)    Pt reports heart attack after last flu shot   Morphine Itching   Motrin  [Ibuprofen] Itching and Other (See Comments)    "Gives false reading in blood"   Oxycodone Itching   Codeine Itching, Nausea Only and Other (See Comments)    Pt takes promethazine with codeine at home   Current Outpatient Medications  Medication Sig Dispense Refill Last Dose   aspirin EC 81 MG tablet Take 81 mg by mouth daily. Swallow whole.      benzonatate (TESSALON PERLES) 100 MG capsule Take 1 capsule (100 mg total) by mouth 3 (three) times daily as needed for cough. 20 capsule 0    ferrous sulfate 325 (65 FE) MG tablet Take 1 tablet (325 mg total) by mouth 2 (two) times daily with a meal. 60 tablet 2    loperamide (IMODIUM) 2 MG capsule Take 2 capsules (4 mg total) by mouth every 6 (six) hours as needed for diarrhea or loose stools (Use if >360mL ileostomy output every 6 hours). 90 capsule 3    methocarbamol (ROBAXIN) 500 MG tablet Take 1 tablet (500 mg total) by mouth every 6 (six) hours as needed for muscle spasms. 20 tablet 2    OFEV 100 MG CAPS Take 100 mg  by mouth daily.      sodium chloride (OCEAN) 0.65 % SOLN nasal spray Place 1 spray into both nostrils as needed for congestion.      SYNTHROID 112 MCG tablet TAKE 1 TABLET BY MOUTH EVERY DAY BEFORE BREAKFAST 90 tablet 0    traMADol (ULTRAM) 50 MG tablet Take 1-2 tablets (50-100 mg total) by mouth every 6 (six) hours as needed for moderate pain. 20 tablet 0    vitamin C (ASCORBIC ACID) 500 MG tablet Take 500 mg by mouth daily.      No current facility-administered medications for this encounter.   ROS  Review of Systems  Constitutional:  Positive for appetite change and fatigue.  Gastrointestinal:        LLQ ileostomy  Endocrine: Positive for polydipsia.  Skin: Negative.   Psychiatric/Behavioral: Negative.    All other systems reviewed and are negative.  Vital signs:  BP (!) 157/83 (BP Location: Right Arm)   Pulse 82   Temp 98.3 F (36.8 C) (Oral)   Resp 18   SpO2 97%  Exam:  Physical Exam Vitals reviewed.   Constitutional:      Appearance: Normal appearance.  HENT:     Mouth/Throat:     Mouth: Mucous membranes are moist.  Abdominal:     Palpations: Abdomen is soft.  Skin:    General: Skin is warm and dry.  Neurological:     Mental Status: She is alert and oriented to person, place, and time.  Psychiatric:        Mood and Affect: Mood normal.        Behavior: Behavior normal.     Stoma type/location:  LLQ ileostomy in previous colostomy site Stomal assessment/size:  oval Peristomal assessment:  intact Treatment options for stomal/peristomal skin: barrier ring 2 piece pouch Output: soft brown stool, liquid at times, monitors I & O Ostomy pouching: 2pc.  Education provided:  WE perform pouch change together.  She has difficulty seeing stoma and feels weak and dizzy at times.  Labwork has shown sufficient hydration levels and kidney function.     Impression/dx  ileostomy Discussion  Continue to increase independence on ostomy care Plan  See back as needed.     Visit time: 40 minutes.   Maple Hudson FNP-BC

## 2022-09-15 ENCOUNTER — Other Ambulatory Visit (HOSPITAL_COMMUNITY): Payer: Self-pay | Admitting: Surgery

## 2022-09-15 DIAGNOSIS — Z932 Ileostomy status: Secondary | ICD-10-CM

## 2022-09-16 DIAGNOSIS — Z9189 Other specified personal risk factors, not elsewhere classified: Secondary | ICD-10-CM | POA: Insufficient documentation

## 2022-09-18 ENCOUNTER — Ambulatory Visit (HOSPITAL_COMMUNITY)
Admission: RE | Admit: 2022-09-18 | Discharge: 2022-09-18 | Disposition: A | Discharge status: Home / Self Care | Payer: Medicare Other | Source: Ambulatory Visit | Attending: Nurse Practitioner | Admitting: Nurse Practitioner

## 2022-09-18 DIAGNOSIS — R198 Other specified symptoms and signs involving the digestive system and abdomen: Secondary | ICD-10-CM | POA: Diagnosis not present

## 2022-09-18 DIAGNOSIS — K9419 Other complications of enterostomy: Secondary | ICD-10-CM | POA: Diagnosis not present

## 2022-09-18 DIAGNOSIS — L24B3 Irritant contact dermatitis related to fecal or urinary stoma or fistula: Secondary | ICD-10-CM | POA: Diagnosis not present

## 2022-09-18 DIAGNOSIS — Z932 Ileostomy status: Secondary | ICD-10-CM

## 2022-09-18 NOTE — Progress Notes (Signed)
[facility]   Reason for visit:  LLQ ileostomy, failed takedown of colostomy.  Continues to have moderate to high output from stoma.  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 the left ventricular wall motion is normal   Hypothyroidism    IPF (idiopathic pulmonary fibrosis) (HCC) 01/2018   Myocardial infarction (HCC)    hx of 30 years ago   Pancreatitis    Thyroid disease    TIA (transient ischemic attack)    8 years ago   Family History  Problem Relation Age of Onset   Ovarian cancer Mother    Uterine cancer Mother    Lung cancer Father    HIV Brother        35   Kidney cancer Brother    Lung cancer Brother    Other Brother        Mouth Cancer   Colon cancer Maternal Aunt    Heart disease Maternal Grandmother    Stroke Maternal Grandmother    Hypertension Maternal Grandmother    Diabetes Maternal Grandmother    Arthritis Other    Esophageal cancer Neg Hx    Liver disease Neg Hx    Rectal cancer Neg Hx    Stomach cancer Neg Hx    Allergies  Allergen Reactions   Prednisone Other (See Comments)    Changed personality    Dilaudid [Hydromorphone] Itching and Other (See Comments)    Dilaudid caused marked confusion   Atrovent Hfa [Ipratropium Bromide Hfa] Itching and Other (See Comments)    Pt could not sleep   Cheratussin Ac [Guaifenesin-Codeine] Other (See Comments)    Headaches    Diltiazem Itching and Other (See Comments)    Makes patient sick. Shakes. GI   Hydrocodone Itching   Influenza Vaccines Other  (See Comments)    Pt reports heart attack after last flu shot   Morphine Itching   Motrin [Ibuprofen] Itching and Other (See Comments)    "Gives false reading in blood"   Oxycodone Itching   Codeine Itching, Nausea Only and Other (See Comments)    Pt takes promethazine with codeine at home   Current Outpatient Medications  Medication Sig Dispense Refill Last Dose   aspirin EC 81 MG tablet Take 81 mg by mouth daily. Swallow whole.      benzonatate (TESSALON PERLES) 100 MG capsule Take 1 capsule (100 mg total) by mouth 3 (three) times daily as needed for cough. 20 capsule 0    ferrous sulfate 325 (65 FE) MG tablet Take 1 tablet (325 mg total) by mouth 2 (two) times daily with a meal. 60 tablet 2    loperamide (IMODIUM) 2 MG capsule Take 2 capsules (4 mg total) by mouth every 6 (six) hours as needed for diarrhea or loose stools (Use if >344mL ileostomy output every 6 hours). 90 capsule 3    methocarbamol (ROBAXIN) 500 MG tablet Take 1 tablet (500 mg total) by mouth every 6 (six) hours  as needed for muscle spasms. 20 tablet 2    OFEV 100 MG CAPS Take 100 mg by mouth daily.      sodium chloride (OCEAN) 0.65 % SOLN nasal spray Place 1 spray into both nostrils as needed for congestion.      SYNTHROID 112 MCG tablet TAKE 1 TABLET BY MOUTH EVERY DAY BEFORE BREAKFAST 90 tablet 0    traMADol (ULTRAM) 50 MG tablet Take 1-2 tablets (50-100 mg total) by mouth every 6 (six) hours as needed for moderate pain. 20 tablet 0    vitamin C (ASCORBIC ACID) 500 MG tablet Take 500 mg by mouth daily.      No current facility-administered medications for this encounter.   ROS  Review of Systems  Constitutional:  Positive for fatigue.  Gastrointestinal:        LLQ ileostomy  Skin:  Positive for color change and rash.  Psychiatric/Behavioral: Negative.     Vital signs:  BP 128/70 (BP Location: Right Arm)   Pulse 79   Temp 97.9 F (36.6 C) (Oral)   Resp 18   SpO2 95%  Exam:  Physical Exam  Stoma  type/location:  LLQ colostomy Stomal assessment/size:  oval, pink and moist Peristomal assessment:  erythema, itching, no evidence of fungal overgrowth.  Doesn't want to consider a convex barrier at this time.  Treatment options for stomal/peristomal skin: stoma powder and skin prep and barrier ring  2 piece pouch Output: liquid green stool Ostomy pouching: 2pc. With barrier ring Education provided:  pouch change performed.  Discussed dehydration risk, consuming adequate water.     Impression/dx  High output ileostomy Contact dermatitis Discussion  See back as needed Plan  See back as needed.     Visit time: 40 minutes.   Maple Hudson FNP-BC

## 2022-09-18 NOTE — Discharge Instructions (Signed)
Stoma powder and skin prep Consider convex barrier

## 2022-09-21 ENCOUNTER — Ambulatory Visit (HOSPITAL_COMMUNITY)
Admission: RE | Admit: 2022-09-21 | Discharge: 2022-09-21 | Disposition: A | Discharge status: Home / Self Care | Payer: Medicare Other | Source: Ambulatory Visit | Attending: Nurse Practitioner | Admitting: Nurse Practitioner

## 2022-09-21 DIAGNOSIS — L24B3 Irritant contact dermatitis related to fecal or urinary stoma or fistula: Secondary | ICD-10-CM | POA: Diagnosis not present

## 2022-09-21 DIAGNOSIS — Z432 Encounter for attention to ileostomy: Secondary | ICD-10-CM | POA: Insufficient documentation

## 2022-09-21 DIAGNOSIS — Z932 Ileostomy status: Secondary | ICD-10-CM | POA: Diagnosis not present

## 2022-09-21 NOTE — Progress Notes (Signed)
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 the left ventricular wall motion is normal   Hypothyroidism    IPF (idiopathic pulmonary fibrosis) (HCC) 01/2018   Myocardial infarction (HCC)    hx of 30 years ago   Pancreatitis    Thyroid disease    TIA (transient ischemic attack)    8 years ago   Family History  Problem Relation Age of Onset   Ovarian cancer Mother    Uterine cancer Mother    Lung cancer Father    HIV Brother        78   Kidney cancer Brother    Lung cancer Brother    Other Brother        Mouth Cancer   Colon cancer Maternal Aunt    Heart disease Maternal Grandmother    Stroke Maternal Grandmother    Hypertension Maternal Grandmother    Diabetes Maternal Grandmother    Arthritis Other    Esophageal cancer Neg Hx    Liver disease Neg Hx    Rectal cancer Neg Hx    Stomach cancer Neg Hx    Allergies  Allergen Reactions   Prednisone Other (See Comments)    Changed personality    Dilaudid [Hydromorphone] Itching and Other (See Comments)    Dilaudid caused marked confusion   Atrovent Hfa [Ipratropium Bromide Hfa] Itching and Other (See Comments)    Pt could not sleep   Cheratussin Ac [Guaifenesin-Codeine] Other (See Comments)    Headaches    Diltiazem Itching and Other (See Comments)    Makes patient sick. Shakes. GI   Hydrocodone Itching   Influenza Vaccines Other (See Comments)    Pt reports heart attack after last flu shot   Morphine Itching   Motrin  [Ibuprofen] Itching and Other (See Comments)    "Gives false reading in blood"   Oxycodone Itching   Codeine Itching, Nausea Only and Other (See Comments)    Pt takes promethazine with codeine at home   Current Outpatient Medications  Medication Sig Dispense Refill Last Dose   aspirin EC 81 MG tablet Take 81 mg by mouth daily. Swallow whole.      benzonatate (TESSALON PERLES) 100 MG capsule Take 1 capsule (100 mg total) by mouth 3 (three) times daily as needed for cough. 20 capsule 0    ferrous sulfate 325 (65 FE) MG tablet Take 1 tablet (325 mg total) by mouth 2 (two) times daily with a meal. 60 tablet 2    loperamide (IMODIUM) 2 MG capsule Take 2 capsules (4 mg total) by mouth every 6 (six) hours as needed for diarrhea or loose stools (Use if >356mL ileostomy output every 6 hours). 90 capsule 3    methocarbamol (ROBAXIN) 500 MG tablet Take 1 tablet (500 mg total) by mouth every 6 (six) hours as needed for muscle spasms. 20 tablet 2    OFEV 100 MG CAPS Take 100  mg by mouth daily.      sodium chloride (OCEAN) 0.65 % SOLN nasal spray Place 1 spray into both nostrils as needed for congestion.      SYNTHROID 112 MCG tablet TAKE 1 TABLET BY MOUTH EVERY DAY BEFORE BREAKFAST 90 tablet 0    traMADol (ULTRAM) 50 MG tablet Take 1-2 tablets (50-100 mg total) by mouth every 6 (six) hours as needed for moderate pain. 20 tablet 0    vitamin C (ASCORBIC ACID) 500 MG tablet Take 500 mg by mouth daily.      No current facility-administered medications for this encounter.   ROS  Review of Systems  Constitutional:  Positive for fatigue.  Gastrointestinal:        LLQ ileostomy  Endocrine: Positive for polydipsia.  Skin:  Positive for color change.  All other systems reviewed and are negative.  Vital signs:  BP (!) 161/83 (BP Location: Right Arm)   Pulse 84   Temp 97.9 F (36.6 C) (Oral)   Resp 20   SpO2 96%  Exam:  Physical Exam  Stoma type/location:  LLQ loop ileostomy (in former colostomy site)   Stomal assessment/size:  oval 1.5 cm x 0.8 cm pink and moist Peristomal assessment:  erythema improved, itching has resolved.  Treatment options for stomal/peristomal skin: stoma powder and skin prep Output: soft brown stool Ostomy pouching: 2pc.  Education provided:  Change pouch twice weekly and if burning, itching or leaking    Impression/dx  Ileostomy Risk for dehydration Discussion  See back as needed Plan  Has adequate supplies, gaining confidence in pouch care    Visit time: 50 minutes.   Maple Hudson FNP-BC

## 2022-09-22 ENCOUNTER — Ambulatory Visit (HOSPITAL_BASED_OUTPATIENT_CLINIC_OR_DEPARTMENT_OTHER)
Admission: RE | Admit: 2022-09-22 | Discharge: 2022-09-22 | Disposition: A | Discharge status: Home / Self Care | Payer: Medicare Other | Source: Ambulatory Visit | Attending: Family Medicine | Admitting: Family Medicine

## 2022-09-22 ENCOUNTER — Encounter: Payer: Self-pay | Admitting: Family Medicine

## 2022-09-22 ENCOUNTER — Ambulatory Visit (INDEPENDENT_AMBULATORY_CARE_PROVIDER_SITE_OTHER): Payer: Medicare Other | Admitting: Family Medicine

## 2022-09-22 VITALS — BP 157/80 | HR 79 | Temp 97.5°F | Ht 63.0 in | Wt 135.0 lb

## 2022-09-22 DIAGNOSIS — J0140 Acute pansinusitis, unspecified: Secondary | ICD-10-CM | POA: Diagnosis not present

## 2022-09-22 DIAGNOSIS — R051 Acute cough: Secondary | ICD-10-CM

## 2022-09-22 DIAGNOSIS — R079 Chest pain, unspecified: Secondary | ICD-10-CM | POA: Diagnosis not present

## 2022-09-22 DIAGNOSIS — R059 Cough, unspecified: Secondary | ICD-10-CM | POA: Diagnosis not present

## 2022-09-22 MED ORDER — ONDANSETRON HCL 4 MG PO TABS: 4.0000 mg | ORAL_TABLET | Freq: Three times a day (TID) | ORAL | 0 refills | Status: DC | PRN

## 2022-09-22 MED ORDER — BENZONATATE 100 MG PO CAPS: 100.0000 mg | ORAL_CAPSULE | Freq: Three times a day (TID) | ORAL | 0 refills | Status: DC | PRN

## 2022-09-22 MED ORDER — AMOXICILLIN-POT CLAVULANATE 875-125 MG PO TABS
1.0000 | ORAL_TABLET | Freq: Two times a day (BID) | ORAL | 0 refills | Status: DC
Start: 2022-09-22 — End: 2022-11-19

## 2022-09-22 NOTE — Progress Notes (Signed)
Acute Office Visit  Subjective:     Patient ID: Alexandria [name], female    DOB: July 15, 1945, 77 y.o.   MRN: [id number]  Chief Complaint  Patient presents with   Sinus Problem     Patient is in today for sinusitis, cough, rib pain.   Discussed the use of AI scribe software for clinical note transcription with the patient, who gave verbal consent to proceed.  History of Present Illness   The patient, with a history of pneumonia, presents with left-sided chest pain under the ribs, similar to a previous episode of pneumonia seven to eight months ago. They also report sinus issues that started six days ago, with pressure in the face and forehead, and a runny nose. The left side of the nose feels stopped up. They deny fever but report waking up with sweats and feeling cold. They have not vomited but feel nauseous and have a desire to vomit. The patient has a constant cough that worsens when lying down, and it sounds productive but they are unable to expectorate. They also report feeling low energy and run down. They have not taken any over-the-counter medications for their symptoms due to fear of interactions.           ROS All review of systems negative except what is listed in the HPI      Objective:    BP (!) 157/80   Pulse 79   Temp (!) 97.5 F (36.4 C) (Oral)   Ht 5\' 3"  (1.6 m)   Wt 135 lb (61.2 kg)   SpO2 99%   BMI 23.91 kg/m    Physical Exam Vitals reviewed.  Constitutional:      Appearance: Normal appearance.  HENT:     Head: Normocephalic and atraumatic.     Comments: Very tender to palpation of maxillary/frontal sinuses Cardiovascular:     Rate and Rhythm: Normal rate and regular rhythm.     Pulses: Normal pulses.     Heart sounds: Normal heart sounds.  Pulmonary:     Effort: Pulmonary effort is normal.     Breath sounds: Normal breath sounds. No wheezing, rhonchi or rales.  Musculoskeletal:     Cervical back: Normal range of motion and neck  supple. No tenderness.  Lymphadenopathy:     Cervical: No cervical adenopathy.  Skin:    General: Skin is warm and dry.  Neurological:     Mental Status: She is alert and oriented to person, place, and time.  Psychiatric:        Mood and Affect: Mood normal.        Behavior: Behavior normal.        Thought Content: Thought content normal.        Judgment: Judgment normal.     No results found for any visits on 09/22/22.      Assessment & Plan:   Problem List Items Addressed This Visit     Pansinusitis - Primary   Relevant Medications   ondansetron (ZOFRAN) 4 MG tablet   benzonatate (TESSALON PERLES) 100 MG capsule   amoxicillin-clavulanate (AUGMENTIN) 875-125 MG tablet   Other Relevant Orders   DG Chest 2 View   Other Visit Diagnoses     Acute cough       Relevant Medications   benzonatate (TESSALON PERLES) 100 MG capsule   amoxicillin-clavulanate (AUGMENTIN) 875-125 MG tablet   Other Relevant Orders   DG Chest 2 View      CXR today given your  severity of symptoms and history - will update you with results and plan.  Please start Mucinex twice a day. Continue supportive measures including rest, hydration, humidifier use, steam showers, warm compresses to sinuses, warm liquids with lemon and honey, and over-the-counter cough, cold, and analgesics as needed.  Adding Augmentin (antibiotic), Tessalon (for cough), and Zofran (for nausea).   Please contact office for follow-up if symptoms do not improve or worsen. Seek emergency care if symptoms become severe.      Meds ordered this encounter  Medications   ondansetron (ZOFRAN) 4 MG tablet    Sig: Take 1 tablet (4 mg total) by mouth every 8 (eight) hours as needed for nausea or vomiting.    Dispense:  20 tablet    Refill:  0    Order Specific Question:   Supervising Provider    Answer:   Danise Edge A [[id number]]   benzonatate (TESSALON PERLES) 100 MG capsule    Sig: Take 1 capsule (100 mg total) by mouth 3 (three)  times daily as needed for cough.    Dispense:  20 capsule    Refill:  0    Order Specific Question:   Supervising Provider    Answer:   Danise Edge A [[id number]]   amoxicillin-clavulanate (AUGMENTIN) 875-125 MG tablet    Sig: Take 1 tablet by mouth 2 (two) times daily.    Dispense:  20 tablet    Refill:  0    Order Specific Question:   Supervising Provider    Answer:   Danise Edge A [[id number]]    Return in about 2 weeks (around 10/06/2022) for BP check with nurse.  Clayborne Dana, NP

## 2022-09-22 NOTE — Patient Instructions (Signed)
CXR today given your severity of symptoms and history - will update you with results and plan.  Please start Mucinex twice a day. Continue supportive measures including rest, hydration, humidifier use, steam showers, warm compresses to sinuses, warm liquids with lemon and honey, and over-the-counter cough, cold, and analgesics as needed.  Adding Augmentin (antibiotic), Tessalon (for cough), and Zofran (for nausea).   Please contact office for follow-up if symptoms do not improve or worsen. Seek emergency care if symptoms become severe.

## 2022-09-25 ENCOUNTER — Other Ambulatory Visit: Payer: Self-pay

## 2022-09-25 ENCOUNTER — Ambulatory Visit (HOSPITAL_COMMUNITY)
Admission: RE | Admit: 2022-09-25 | Discharge: 2022-09-25 | Disposition: A | Discharge status: Home / Self Care | Payer: Medicare Other | Source: Ambulatory Visit | Attending: Nurse Practitioner | Admitting: Nurse Practitioner

## 2022-09-25 ENCOUNTER — Other Ambulatory Visit (HOSPITAL_COMMUNITY): Payer: Self-pay | Admitting: Nurse Practitioner

## 2022-09-25 DIAGNOSIS — R921 Mammographic calcification found on diagnostic imaging of breast: Secondary | ICD-10-CM | POA: Diagnosis not present

## 2022-09-25 DIAGNOSIS — Z79890 Hormone replacement therapy (postmenopausal): Secondary | ICD-10-CM | POA: Insufficient documentation

## 2022-09-25 DIAGNOSIS — L22 Diaper dermatitis: Secondary | ICD-10-CM | POA: Diagnosis not present

## 2022-09-25 DIAGNOSIS — B372 Candidiasis of skin and nail: Secondary | ICD-10-CM

## 2022-09-25 DIAGNOSIS — Z86718 Personal history of other venous thrombosis and embolism: Secondary | ICD-10-CM | POA: Diagnosis not present

## 2022-09-25 DIAGNOSIS — R928 Other abnormal and inconclusive findings on diagnostic imaging of breast: Secondary | ICD-10-CM | POA: Diagnosis not present

## 2022-09-25 DIAGNOSIS — Z7982 Long term (current) use of aspirin: Secondary | ICD-10-CM | POA: Diagnosis not present

## 2022-09-25 DIAGNOSIS — Z8673 Personal history of transient ischemic attack (TIA), and cerebral infarction without residual deficits: Secondary | ICD-10-CM | POA: Insufficient documentation

## 2022-09-25 DIAGNOSIS — I252 Old myocardial infarction: Secondary | ICD-10-CM | POA: Diagnosis not present

## 2022-09-25 DIAGNOSIS — Z932 Ileostomy status: Secondary | ICD-10-CM

## 2022-09-25 DIAGNOSIS — N6489 Other specified disorders of breast: Secondary | ICD-10-CM | POA: Diagnosis not present

## 2022-09-25 DIAGNOSIS — E039 Hypothyroidism, unspecified: Secondary | ICD-10-CM | POA: Insufficient documentation

## 2022-09-25 DIAGNOSIS — Z432 Encounter for attention to ileostomy: Secondary | ICD-10-CM | POA: Diagnosis not present

## 2022-09-25 MED ORDER — FLUCONAZOLE 100 MG PO TABS: 100.0000 mg | ORAL_TABLET | Freq: Every day | ORAL | 1 refills | Status: AC

## 2022-09-25 NOTE — Progress Notes (Signed)
[facility]   Reason for visit:  LLQ loop ileostomy in previous colostomy site.  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 the left ventricular wall motion is normal   Hypothyroidism    IPF (idiopathic pulmonary fibrosis) (HCC) 01/2018   Myocardial infarction (HCC)    hx of 30 years ago   Pancreatitis    Thyroid disease    TIA (transient ischemic attack)    8 years ago   Family History  Problem Relation Age of Onset   Ovarian cancer Mother    Uterine cancer Mother    Lung cancer Father    HIV Brother        43   Kidney cancer Brother    Lung cancer Brother    Other Brother        Mouth Cancer   Colon cancer Maternal Aunt    Heart disease Maternal Grandmother    Stroke Maternal Grandmother    Hypertension Maternal Grandmother    Diabetes Maternal Grandmother    Arthritis Other    Esophageal cancer Neg Hx    Liver disease Neg Hx    Rectal cancer Neg Hx    Stomach cancer Neg Hx    Allergies  Allergen Reactions   Prednisone Other (See Comments)    Changed personality    Dilaudid [Hydromorphone] Itching and Other (See Comments)    Dilaudid caused marked confusion   Atrovent Hfa [Ipratropium Bromide Hfa] Itching and Other (See Comments)    Pt could not sleep   Cheratussin Ac [Guaifenesin-Codeine] Other (See Comments)    Headaches    Diltiazem Itching and Other (See Comments)    Makes patient sick. Shakes. GI   Hydrocodone Itching   Influenza Vaccines Other (See Comments)    Pt reports heart attack after last flu shot    Morphine Itching   Motrin [Ibuprofen] Itching and Other (See Comments)    "Gives false reading in blood"   Oxycodone Itching   Codeine Itching, Nausea Only and Other (See Comments)    Pt takes promethazine with codeine at home   Current Outpatient Medications  Medication Sig Dispense Refill Last Dose   amoxicillin-clavulanate (AUGMENTIN) 875-125 MG tablet Take 1 tablet by mouth 2 (two) times daily. 20 tablet 0    aspirin EC 81 MG tablet Take 81 mg by mouth daily. Swallow whole.      benzonatate (TESSALON PERLES) 100 MG capsule Take 1 capsule (100 mg total) by mouth 3 (three) times daily as needed for cough. 20 capsule 0    ferrous sulfate 325 (65 FE) MG tablet Take 1 tablet (325 mg total) by mouth 2 (two) times daily with a meal. 60 tablet 2    loperamide (IMODIUM) 2 MG capsule Take 2 capsules (4 mg total) by mouth every 6 (six) hours as needed for diarrhea or loose stools (Use if >312mL ileostomy output every 6 hours). 90 capsule 3    methocarbamol (ROBAXIN) 500 MG tablet Take 1 tablet (  500 mg total) by mouth every 6 (six) hours as needed for muscle spasms. 20 tablet 2    OFEV 100 MG CAPS Take 100 mg by mouth daily.      ondansetron (ZOFRAN) 4 MG tablet Take 1 tablet (4 mg total) by mouth every 8 (eight) hours as needed for nausea or vomiting. 20 tablet 0    sodium chloride (OCEAN) 0.65 % SOLN nasal spray Place 1 spray into both nostrils as needed for congestion.      SYNTHROID 112 MCG tablet TAKE 1 TABLET BY MOUTH EVERY DAY BEFORE BREAKFAST 90 tablet 0    traMADol (ULTRAM) 50 MG tablet Take 1-2 tablets (50-100 mg total) by mouth every 6 (six) hours as needed for moderate pain. 20 tablet 0    vitamin C (ASCORBIC ACID) 500 MG tablet Take 500 mg by mouth daily.      No current facility-administered medications for this encounter.   ROS  Review of Systems  Gastrointestinal:        LLQ ileostomy  Skin:  Positive for color change and rash.       Peristomal itching and burning  Neurological:  Negative.   Psychiatric/Behavioral: Negative.    All other systems reviewed and are negative.  Vital signs:  BP (!) 172/108 (BP Location: Right Arm)   Pulse 66   Temp 97.9 F (36.6 C) (Oral)   Resp 20   SpO2 95%  Exam:  Physical Exam Vitals reviewed.  Constitutional:      Appearance: Normal appearance.  Pulmonary:     Comments: cough Abdominal:     Palpations: Abdomen is soft.  Skin:    General: Skin is warm and dry.     Findings: Erythema and rash present.  Neurological:     Mental Status: She is alert and oriented to person, place, and time.  Psychiatric:        Mood and Affect: Mood normal.        Behavior: Behavior normal.     Stoma type/location:  LLQ loop ileostomy Stomal assessment/size:  3 cm x 1.8 cm  Peristomal assessment:  intact  some erythema and itching. Will send in Diflucan order Treatment options for stomal/peristomal skin: oral antifungal Output: soft brown stool Ostomy pouching: 2pc.  Education provided:  pouch change performed. Discussed using stoma powder to absorb moisture and prevent fungal overgrowth.     Impression/dx  Candidiasis Ileostomy Contact dermatitis Discussion  Begin Diflucan x1 Plan  See back as needed     Visit time: 45 minutes.   Maple Hudson FNP-BC

## 2022-09-28 ENCOUNTER — Other Ambulatory Visit: Payer: Self-pay | Admitting: Obstetrics and Gynecology

## 2022-09-28 ENCOUNTER — Ambulatory Visit (HOSPITAL_COMMUNITY): Payer: Medicare Other | Admitting: Nurse Practitioner

## 2022-09-28 DIAGNOSIS — Z932 Ileostomy status: Secondary | ICD-10-CM | POA: Insufficient documentation

## 2022-09-28 DIAGNOSIS — R921 Mammographic calcification found on diagnostic imaging of breast: Secondary | ICD-10-CM

## 2022-09-28 DIAGNOSIS — B372 Candidiasis of skin and nail: Secondary | ICD-10-CM | POA: Insufficient documentation

## 2022-09-28 DIAGNOSIS — Z432 Encounter for attention to ileostomy: Secondary | ICD-10-CM | POA: Insufficient documentation

## 2022-09-28 NOTE — Discharge Instructions (Signed)
Pick up Diflucan.  One dose

## 2022-09-29 ENCOUNTER — Other Ambulatory Visit (HOSPITAL_COMMUNITY): Payer: Self-pay | Admitting: Nurse Practitioner

## 2022-09-29 ENCOUNTER — Encounter (HOSPITAL_COMMUNITY)
Admission: RE | Admit: 2022-09-29 | Discharge: 2022-09-29 | Disposition: A | Discharge status: Home / Self Care | Payer: Medicare Other | Source: Ambulatory Visit | Attending: Family Medicine | Admitting: Family Medicine

## 2022-09-29 DIAGNOSIS — B372 Candidiasis of skin and nail: Secondary | ICD-10-CM

## 2022-09-29 DIAGNOSIS — Z432 Encounter for attention to ileostomy: Secondary | ICD-10-CM | POA: Diagnosis not present

## 2022-09-29 DIAGNOSIS — L22 Diaper dermatitis: Secondary | ICD-10-CM

## 2022-09-29 MED ORDER — FLUCONAZOLE 100 MG PO TABS: 100.0000 mg | ORAL_TABLET | Freq: Every day | ORAL | 1 refills | Status: AC

## 2022-09-29 NOTE — Progress Notes (Signed)
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  Can change pouch if needed, but has difficulty visualizing stoma.  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 the left ventricular wall motion is normal   Hypothyroidism    IPF (idiopathic pulmonary fibrosis) (HCC) 01/2018   Myocardial infarction (HCC)    hx of 30 years ago   Pancreatitis    Thyroid disease    TIA (transient ischemic attack)    8 years ago   Family History  Problem Relation Age of Onset   Ovarian cancer Mother    Uterine cancer Mother    Lung cancer Father    HIV Brother        52   Kidney cancer Brother    Lung cancer Brother    Other Brother        Mouth Cancer   Colon cancer Maternal Aunt    Heart disease Maternal Grandmother    Stroke Maternal Grandmother    Hypertension Maternal Grandmother    Diabetes Maternal Grandmother    Arthritis Other    Esophageal cancer Neg Hx    Liver disease Neg Hx    Rectal cancer Neg Hx    Stomach cancer Neg Hx    Allergies  Allergen Reactions   Prednisone Other (See Comments)    Changed personality    Dilaudid [Hydromorphone] Itching and Other (See Comments)    Dilaudid caused marked confusion   Atrovent Hfa [Ipratropium Bromide Hfa] Itching and Other (See Comments)    Pt could not sleep   Cheratussin Ac [Guaifenesin-Codeine] Other (See Comments)    Headaches    Diltiazem Itching and Other (See Comments)    Makes patient sick. Shakes. GI   Hydrocodone Itching   Influenza Vaccines Other  (See Comments)    Pt reports heart attack after last flu shot   Morphine Itching   Motrin [Ibuprofen] Itching and Other (See Comments)    "Gives false reading in blood"   Oxycodone Itching   Codeine Itching, Nausea Only and Other (See Comments)    Pt takes promethazine with codeine at home   Current Outpatient Medications  Medication Sig Dispense Refill Last Dose   amoxicillin-clavulanate (AUGMENTIN) 875-125 MG tablet Take 1 tablet by mouth 2 (two) times daily. 20 tablet 0    aspirin EC 81 MG tablet Take 81 mg by mouth daily. Swallow whole.      benzonatate (TESSALON PERLES) 100 MG capsule Take 1 capsule (100 mg total) by mouth 3 (three) times daily as needed for cough. 20 capsule 0    ferrous sulfate 325 (65 FE) MG tablet Take 1 tablet (325 mg total) by mouth 2 (two) times daily with a meal. 60 tablet 2    loperamide (IMODIUM) 2 MG capsule Take 2 capsules (4 mg total) by mouth every 6 (six) hours as needed for diarrhea or loose stools (Use if >367mL ileostomy output every 6 hours). 90  capsule 3    methocarbamol (ROBAXIN) 500 MG tablet Take 1 tablet (500 mg total) by mouth every 6 (six) hours as needed for muscle spasms. 20 tablet 2    OFEV 100 MG CAPS Take 100 mg by mouth daily.      ondansetron (ZOFRAN) 4 MG tablet Take 1 tablet (4 mg total) by mouth every 8 (eight) hours as needed for nausea or vomiting. 20 tablet 0    sodium chloride (OCEAN) 0.65 % SOLN nasal spray Place 1 spray into both nostrils as needed for congestion.      SYNTHROID 112 MCG tablet TAKE 1 TABLET BY MOUTH EVERY DAY BEFORE BREAKFAST 90 tablet 0    traMADol (ULTRAM) 50 MG tablet Take 1-2 tablets (50-100 mg total) by mouth every 6 (six) hours as needed for moderate pain. 20 tablet 0    vitamin C (ASCORBIC ACID) 500 MG tablet Take 500 mg by mouth daily.      No current facility-administered medications for this encounter.   ROS  Review of Systems  Gastrointestinal:        LLQ ileostomy  Skin:  Positive for rash.   All other systems reviewed and are negative.  Vital signs:  BP (!) 141/79   Pulse 73   Temp 97.9 F (36.6 C) (Oral)   Resp 20   SpO2 94%  Exam:  Physical Exam Vitals reviewed.  Constitutional:      Appearance: Normal appearance.  Abdominal:     Palpations: Abdomen is soft.  Skin:    General: Skin is warm and dry.     Findings: Erythema and rash present.  Neurological:     Mental Status: She is alert and oriented to person, place, and time.  Psychiatric:        Mood and Affect: Mood normal.        Behavior: Behavior normal.     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  Completed one dose of diflucan.  Will order one more.  Dusted with antifungal powder instead of stoma powder, sealed with skin prep Treatment options for stomal/peristomal skin: powder, skin prep and barrier ring Output: soft brown stool Ostomy pouching: 2pc.  Education provided:  keep skin clean and dry during summer months to combat fungal overgrowth  demonstrated dusting with antifungal powder    Impression/dx  Peristomal candidiasis ileostomy  Discussion  See back as needed Plan  Given antifungal powder to use for now.     Visit time: 35 minutes.   Maple Hudson FNP-BC

## 2022-10-01 DIAGNOSIS — J3489 Other specified disorders of nose and nasal sinuses: Secondary | ICD-10-CM | POA: Diagnosis not present

## 2022-10-01 DIAGNOSIS — M950 Acquired deformity of nose: Secondary | ICD-10-CM | POA: Diagnosis not present

## 2022-10-02 ENCOUNTER — Ambulatory Visit (HOSPITAL_COMMUNITY)
Admission: RE | Admit: 2022-10-02 | Discharge: 2022-10-02 | Disposition: A | Discharge status: Home / Self Care | Payer: Medicare Other | Source: Ambulatory Visit | Attending: Nurse Practitioner | Admitting: Nurse Practitioner

## 2022-10-02 DIAGNOSIS — L249 Irritant contact dermatitis, unspecified cause: Secondary | ICD-10-CM | POA: Diagnosis not present

## 2022-10-02 DIAGNOSIS — L24B3 Irritant contact dermatitis related to fecal or urinary stoma or fistula: Secondary | ICD-10-CM | POA: Diagnosis not present

## 2022-10-02 DIAGNOSIS — Z432 Encounter for attention to ileostomy: Secondary | ICD-10-CM | POA: Diagnosis not present

## 2022-10-02 DIAGNOSIS — Z932 Ileostomy status: Secondary | ICD-10-CM | POA: Diagnosis not present

## 2022-10-02 NOTE — Discharge Instructions (Signed)
Use antifungal powder instead of stoma powder Will call in additional dose of diflucan

## 2022-10-02 NOTE — Progress Notes (Signed)
[facility]   Reason for visit:  LLQ loop ileostomy in previous colostomy site.  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 the left ventricular wall motion is normal   Hypothyroidism    IPF (idiopathic pulmonary fibrosis) (HCC) 01/2018   Myocardial infarction (HCC)    hx of 30 years ago   Pancreatitis    Thyroid disease    TIA (transient ischemic attack)    8 years ago   Family History  Problem Relation Age of Onset   Ovarian cancer Mother    Uterine cancer Mother    Lung cancer Father    HIV Brother        1982   Kidney cancer Brother    Lung cancer Brother    Other Brother        Mouth Cancer   Colon cancer Maternal Aunt    Heart disease Maternal Grandmother    Stroke Maternal Grandmother    Hypertension Maternal Grandmother    Diabetes Maternal Grandmother    Arthritis Other    Esophageal cancer Neg Hx    Liver disease Neg Hx    Rectal cancer Neg Hx    Stomach cancer Neg Hx    Allergies  Allergen Reactions   Prednisone Other (See Comments)    Changed personality    Dilaudid [Hydromorphone] Itching and Other (See Comments)    Dilaudid caused marked confusion   Atrovent Hfa [Ipratropium Bromide Hfa] Itching and Other (See Comments)    Pt could not sleep   Cheratussin Ac [Guaifenesin-Codeine] Other (See Comments)    Headaches    Diltiazem Itching and Other (See Comments)    Makes patient sick. Shakes. GI   Hydrocodone Itching   Influenza Vaccines Other (See Comments)    Pt reports heart attack after last flu shot    Morphine Itching   Motrin [Ibuprofen] Itching and Other (See Comments)    "Gives false reading in blood"   Oxycodone Itching   Codeine Itching, Nausea Only and Other (See Comments)    Pt takes promethazine with codeine at home   Current Outpatient Medications  Medication Sig Dispense Refill Last Dose   amoxicillin-clavulanate (AUGMENTIN) 875-125 MG tablet Take 1 tablet by mouth 2 (two) times daily. 20 tablet 0    aspirin EC 81 MG tablet Take 81 mg by mouth daily. Swallow whole.      benzonatate (TESSALON PERLES) 100 MG capsule Take 1 capsule (100 mg total) by mouth 3 (three) times daily as needed for cough. 20 capsule 0    ferrous sulfate 325 (65 FE) MG tablet Take 1 tablet (325 mg total) by mouth 2 (two) times daily with a meal. 60 tablet 2    loperamide (IMODIUM) 2 MG capsule Take 2 capsules (4 mg total) by mouth every 6 (six) hours as needed for diarrhea or loose stools (Use if >300mL ileostomy output every 6 hours). 90 capsule 3    methocarbamol (ROBAXIN) 500 MG tablet Take 1 tablet (  500 mg total) by mouth every 6 (six) hours as needed for muscle spasms. 20 tablet 2    OFEV 100 MG CAPS Take 100 mg by mouth daily.      ondansetron (ZOFRAN) 4 MG tablet Take 1 tablet (4 mg total) by mouth every 8 (eight) hours as needed for nausea or vomiting. 20 tablet 0    sodium chloride (OCEAN) 0.65 % SOLN nasal spray Place 1 spray into both nostrils as needed for congestion.      SYNTHROID 112 MCG tablet TAKE 1 TABLET BY MOUTH EVERY DAY BEFORE BREAKFAST 90 tablet 0    traMADol (ULTRAM) 50 MG tablet Take 1-2 tablets (50-100 mg total) by mouth every 6 (six) hours as needed for moderate pain. 20 tablet 0    vitamin C (ASCORBIC ACID) 500 MG tablet Take 500 mg by mouth daily.      No current facility-administered medications for this encounter.   ROS  Review of Systems  Gastrointestinal:        LLQ ileostomy  Skin:  Positive for color change and rash.       Peristomal itching and burning  Neurological:  Negative.   Psychiatric/Behavioral: Negative.    All other systems reviewed and are negative.  Vital signs:  BP (!) 172/108 (BP Location: Right Arm)   Pulse 66   Temp 97.9 F (36.6 C) (Oral)   Resp 20   SpO2 95%  Exam:  Physical Exam Vitals reviewed.  Constitutional:      Appearance: Normal appearance.  Pulmonary:     Comments: cough Abdominal:     Palpations: Abdomen is soft.  Skin:    General: Skin is warm and dry.     Findings: Erythema and rash present.  Neurological:     Mental Status: She is alert and oriented to person, place, and time.  Psychiatric:        Mood and Affect: Mood normal.        Behavior: Behavior normal.     Stoma type/location:  LLQ loop ileostomy Stomal assessment/size:  3 cm x 1.8 cm  Peristomal assessment:  intact  some erythema and itching. Will send in Diflucan order Treatment options for stomal/peristomal skin: oral antifungal Output: soft brown stool Ostomy pouching: 2pc.  Education provided:  pouch change performed. Discussed using stoma powder to absorb moisture and prevent fungal overgrowth.     Impression/dx  Candidiasis Ileostomy Contact dermatitis Discussion  Begin Diflucan x1 Plan  See back as needed     Visit time: 45 minutes.   Jesicca Dipierro FNP-BC    

## 2022-10-06 ENCOUNTER — Ambulatory Visit (HOSPITAL_COMMUNITY)
Admission: RE | Admit: 2022-10-06 | Discharge: 2022-10-06 | Disposition: A | Discharge status: Home / Self Care | Payer: Medicare Other | Source: Ambulatory Visit | Attending: Nurse Practitioner | Admitting: Nurse Practitioner

## 2022-10-06 DIAGNOSIS — Z432 Encounter for attention to ileostomy: Secondary | ICD-10-CM

## 2022-10-06 DIAGNOSIS — L24B3 Irritant contact dermatitis related to fecal or urinary stoma or fistula: Secondary | ICD-10-CM

## 2022-10-06 DIAGNOSIS — K9412 Enterostomy infection: Secondary | ICD-10-CM | POA: Insufficient documentation

## 2022-10-06 DIAGNOSIS — L259 Unspecified contact dermatitis, unspecified cause: Secondary | ICD-10-CM | POA: Diagnosis not present

## 2022-10-06 NOTE — Discharge Instructions (Signed)
Use antifungal powder and skin prep around stoma

## 2022-10-06 NOTE — Progress Notes (Signed)
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 the left ventricular wall motion is normal   Hypothyroidism    IPF (idiopathic pulmonary fibrosis) (HCC) 01/2018   Myocardial infarction (HCC)    hx of 30 years ago   Pancreatitis    Thyroid disease    TIA (transient ischemic attack)    8 years ago   Family History  Problem Relation Age of Onset   Ovarian cancer Mother    Uterine cancer Mother    Lung cancer Father    HIV Brother        32   Kidney cancer Brother    Lung cancer Brother    Other Brother        Mouth Cancer   Colon cancer Maternal Aunt    Heart disease Maternal Grandmother    Stroke Maternal Grandmother    Hypertension Maternal Grandmother    Diabetes Maternal Grandmother    Arthritis Other    Esophageal cancer Neg Hx    Liver disease Neg Hx    Rectal cancer Neg Hx    Stomach cancer Neg Hx    Allergies  Allergen Reactions   Prednisone Other (See Comments)    Changed personality    Dilaudid [Hydromorphone] Itching and Other (See Comments)    Dilaudid caused marked confusion   Atrovent Hfa [Ipratropium Bromide Hfa] Itching and Other (See Comments)    Pt could not sleep   Cheratussin Ac [Guaifenesin-Codeine] Other (See Comments)    Headaches    Diltiazem Itching and Other (See Comments)    Makes patient sick. Shakes. GI   Hydrocodone Itching   Influenza Vaccines Other (See Comments)    Pt reports heart attack after last flu shot   Morphine Itching   Motrin [Ibuprofen] Itching and Other (See  Comments)    "Gives false reading in blood"   Oxycodone Itching   Codeine Itching, Nausea Only and Other (See Comments)    Pt takes promethazine with codeine at home   Current Outpatient Medications  Medication Sig Dispense Refill Last Dose   amoxicillin-clavulanate (AUGMENTIN) 875-125 MG tablet Take 1 tablet by mouth 2 (two) times daily. 20 tablet 0    aspirin EC 81 MG tablet Take 81 mg by mouth daily. Swallow whole.      benzonatate (TESSALON PERLES) 100 MG capsule Take 1 capsule (100 mg total) by mouth 3 (three) times daily as needed for cough. 20 capsule 0    ferrous sulfate 325 (65 FE) MG tablet Take 1 tablet (325 mg total) by mouth 2 (two) times daily with a meal. 60 tablet 2    loperamide (IMODIUM) 2 MG capsule Take 2 capsules (4 mg total) by mouth every 6 (six) hours as needed for diarrhea or loose stools (Use if >338mL ileostomy output every 6 hours). 90 capsule 3    methocarbamol (ROBAXIN) 500 MG tablet Take 1 tablet (500 mg total) by mouth every 6 (six) hours as needed  for muscle spasms. 20 tablet 2    OFEV 100 MG CAPS Take 100 mg by mouth daily.      ondansetron (ZOFRAN) 4 MG tablet Take 1 tablet (4 mg total) by mouth every 8 (eight) hours as needed for nausea or vomiting. 20 tablet 0    sodium chloride (OCEAN) 0.65 % SOLN nasal spray Place 1 spray into both nostrils as needed for congestion.      SYNTHROID 112 MCG tablet TAKE 1 TABLET BY MOUTH EVERY DAY BEFORE BREAKFAST 90 tablet 0    traMADol (ULTRAM) 50 MG tablet Take 1-2 tablets (50-100 mg total) by mouth every 6 (six) hours as needed for moderate pain. 20 tablet 0    vitamin C (ASCORBIC ACID) 500 MG tablet Take 500 mg by mouth daily.      No current facility-administered medications for this encounter.   ROS  Review of Systems  Constitutional:  Positive for fatigue.  Gastrointestinal:        LLQ loop ileostomy  Skin:  Positive for rash.       Itching to peristomal skin  Psychiatric/Behavioral: Negative.    All other  systems reviewed and are negative.  Vital signs:  BP 129/63 (BP Location: Right Arm)   Pulse 79   Temp 98.2 F (36.8 C) (Oral)   Resp 18   SpO2 94%  Exam:  Physical Exam Vitals reviewed.  Constitutional:      Appearance: Normal appearance.  Abdominal:     Palpations: Abdomen is soft.  Skin:    General: Skin is warm and dry.     Findings: Erythema present.  Neurological:     Mental Status: She is alert and oriented to person, place, and time.  Psychiatric:        Mood and Affect: Mood normal.        Behavior: Behavior normal.     Stoma type/location:  LLQ loop ileostomy Stomal assessment/size:  oval  2 cm x 1.5 cm budded and pink Peristomal assessment:  itching and erythema-has completed diflucan Treatment options for stomal/peristomal skin: skin prep Output: soft brown stool Ostomy pouching: 2pc.  Education provided:  skin prep    Impression/dx  Contact dermatitis colostomy Discussion  See back as needed.   Plan  Has upcoming colostomy in anticipation of reversal.     Visit time: 40 minutes.   Maple Hudson FNP-BC

## 2022-10-12 ENCOUNTER — Other Ambulatory Visit: Payer: Self-pay | Admitting: Family Medicine

## 2022-10-12 DIAGNOSIS — J0140 Acute pansinusitis, unspecified: Secondary | ICD-10-CM

## 2022-10-13 ENCOUNTER — Ambulatory Visit (HOSPITAL_COMMUNITY)
Admission: RE | Admit: 2022-10-13 | Discharge: 2022-10-13 | Disposition: A | Discharge status: Home / Self Care | Payer: Medicare Other | Source: Ambulatory Visit | Attending: Surgery | Admitting: Surgery

## 2022-10-13 DIAGNOSIS — K6389 Other specified diseases of intestine: Secondary | ICD-10-CM | POA: Diagnosis not present

## 2022-10-13 DIAGNOSIS — Z932 Ileostomy status: Secondary | ICD-10-CM | POA: Insufficient documentation

## 2022-10-13 MED ORDER — IOHEXOL 300 MG/ML  SOLN
50.0000 mL | Freq: Once | INTRAMUSCULAR | Status: AC | PRN
  Administered 2022-10-13: 45 mL

## 2022-10-16 ENCOUNTER — Ambulatory Visit (HOSPITAL_COMMUNITY)
Admission: RE | Admit: 2022-10-16 | Discharge: 2022-10-16 | Disposition: A | Discharge status: Home / Self Care | Payer: Medicare Other | Source: Ambulatory Visit | Attending: Nurse Practitioner | Admitting: Nurse Practitioner

## 2022-10-16 DIAGNOSIS — Z432 Encounter for attention to ileostomy: Secondary | ICD-10-CM | POA: Diagnosis present

## 2022-10-16 DIAGNOSIS — K9413 Enterostomy malfunction: Secondary | ICD-10-CM | POA: Diagnosis not present

## 2022-10-16 DIAGNOSIS — Z932 Ileostomy status: Secondary | ICD-10-CM | POA: Diagnosis not present

## 2022-10-16 DIAGNOSIS — L24B3 Irritant contact dermatitis related to fecal or urinary stoma or fistula: Secondary | ICD-10-CM | POA: Diagnosis not present

## 2022-10-16 NOTE — Progress Notes (Signed)
[facility]   Reason for visit:  LLQ loop ileostomy in former colostomy site. Awaiting reversal.  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 the left ventricular wall motion is normal   Hypothyroidism    IPF (idiopathic pulmonary fibrosis) (HCC) 01/2018   Myocardial infarction (HCC)    hx of 30 years ago   Pancreatitis    Thyroid disease    TIA (transient ischemic attack)    8 years ago   Family History  Problem Relation Age of Onset   Ovarian cancer Mother    Uterine cancer Mother    Lung cancer Father    HIV Brother        87   Kidney cancer Brother    Lung cancer Brother    Other Brother        Mouth Cancer   Colon cancer Maternal Aunt    Heart disease Maternal Grandmother    Stroke Maternal Grandmother    Hypertension Maternal Grandmother    Diabetes Maternal Grandmother    Arthritis Other    Esophageal cancer Neg Hx    Liver disease Neg Hx    Rectal cancer Neg Hx    Stomach cancer Neg Hx    Allergies  Allergen Reactions   Prednisone Other (See Comments)    Changed personality    Dilaudid [Hydromorphone] Itching and Other (See Comments)    Dilaudid caused marked confusion   Atrovent Hfa [Ipratropium Bromide Hfa] Itching and Other (See Comments)    Pt could not sleep   Cheratussin Ac [Guaifenesin-Codeine] Other (See Comments)    Headaches    Diltiazem Itching and Other (See Comments)    Makes patient sick. Shakes. GI   Hydrocodone Itching   Influenza Vaccines Other (See Comments)    Pt reports heart attack after last flu shot    Morphine Itching   Motrin [Ibuprofen] Itching and Other (See Comments)    "Gives false reading in blood"   Oxycodone Itching   Codeine Itching, Nausea Only and Other (See Comments)    Pt takes promethazine with codeine at home   Current Outpatient Medications  Medication Sig Dispense Refill Last Dose   amoxicillin-clavulanate (AUGMENTIN) 875-125 MG tablet Take 1 tablet by mouth 2 (two) times daily. 20 tablet 0    aspirin EC 81 MG tablet Take 81 mg by mouth daily. Swallow whole.      benzonatate (TESSALON PERLES) 100 MG capsule Take 1 capsule (100 mg total) by mouth 3 (three) times daily as needed for cough. 20 capsule 0    ferrous sulfate 325 (65 FE) MG tablet Take 1 tablet (325 mg total) by mouth 2 (two) times daily with a meal. 60 tablet 2    loperamide (IMODIUM) 2 MG capsule Take 2 capsules (4 mg total) by mouth every 6 (six) hours as needed for diarrhea or loose stools (Use if >351mL ileostomy output every 6 hours). 90 capsule 3    methocarbamol (ROBAXIN) 500 MG tablet Take 1 tablet (500  mg total) by mouth every 6 (six) hours as needed for muscle spasms. 20 tablet 2    OFEV 100 MG CAPS Take 100 mg by mouth daily.      ondansetron (ZOFRAN) 4 MG tablet TAKE 1 TABLET BY MOUTH EVERY 8 HOURS AS NEEDED FOR NAUSEA AND VOMITING 20 tablet 0    sodium chloride (OCEAN) 0.65 % SOLN nasal spray Place 1 spray into both nostrils as needed for congestion.      SYNTHROID 112 MCG tablet TAKE 1 TABLET BY MOUTH EVERY DAY BEFORE BREAKFAST 90 tablet 0    traMADol (ULTRAM) 50 MG tablet Take 1-2 tablets (50-100 mg total) by mouth every 6 (six) hours as needed for moderate pain. 20 tablet 0    vitamin C (ASCORBIC ACID) 500 MG tablet Take 500 mg by mouth daily.      No current facility-administered medications for this encounter.   ROS  Review of Systems  Gastrointestinal:        LLQ ileostomy Recent colonoscopy  Skin:  Positive for color change and rash.       Peristomal dermatitis  All other systems  reviewed and are negative.  Vital signs:  BP (!) 147/73 (BP Location: Right Arm)   Pulse 76   Temp 97.9 F (36.6 C) (Oral)   Resp 18   SpO2 92%  Exam:  Physical Exam Vitals reviewed.  Constitutional:      Appearance: Normal appearance.  HENT:     Mouth/Throat:     Mouth: Mucous membranes are moist.  Abdominal:     Palpations: Abdomen is soft.  Skin:    General: Skin is warm and dry.     Findings: Erythema present.  Neurological:     Mental Status: She is alert and oriented to person, place, and time.  Psychiatric:        Mood and Affect: Mood normal.        Behavior: Behavior normal.     Stoma type/location:  LLQ ileostomy, oval Stomal assessment/size:  2.5 cm x 2 cm budded Peristomal assessment:  erythema  Treatment options for stomal/peristomal skin: barrier ring and 2piece pouch Output: soft brown stool Ostomy pouching:2pc.  Flat with barrier ring Education provided:  continue barrier ring    Impression/dx  Contact dermatitis ileostomy Discussion  See back as needed.  Change pouch promptly when itching or burning Plan  See back as needed.     Visit time: 35 minutes.   Alexandria Hudson FNP-BC

## 2022-10-20 ENCOUNTER — Ambulatory Visit (HOSPITAL_COMMUNITY)
Admission: RE | Admit: 2022-10-20 | Discharge: 2022-10-20 | Disposition: A | Discharge status: Home / Self Care | Payer: Medicare Other | Source: Ambulatory Visit | Attending: Family Medicine | Admitting: Family Medicine

## 2022-10-20 DIAGNOSIS — Z432 Encounter for attention to ileostomy: Secondary | ICD-10-CM | POA: Diagnosis not present

## 2022-10-20 DIAGNOSIS — L259 Unspecified contact dermatitis, unspecified cause: Secondary | ICD-10-CM | POA: Diagnosis not present

## 2022-10-20 DIAGNOSIS — L24B3 Irritant contact dermatitis related to fecal or urinary stoma or fistula: Secondary | ICD-10-CM

## 2022-10-20 NOTE — Progress Notes (Signed)
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 the left ventricular wall motion is normal   Hypothyroidism    IPF (idiopathic pulmonary fibrosis) (HCC) 01/2018   Myocardial infarction (HCC)    hx of 30 years ago   Pancreatitis    Thyroid disease    TIA (transient ischemic attack)    8 years ago   Family History  Problem Relation Age of Onset   Ovarian cancer Mother    Uterine cancer Mother    Lung cancer Father    HIV Brother        35   Kidney cancer Brother    Lung cancer Brother    Other Brother        Mouth Cancer   Colon cancer Maternal Aunt    Heart disease Maternal Grandmother    Stroke Maternal Grandmother    Hypertension Maternal Grandmother    Diabetes Maternal Grandmother    Arthritis Other    Esophageal cancer Neg Hx    Liver disease Neg Hx    Rectal cancer Neg Hx    Stomach cancer Neg Hx    Allergies  Allergen Reactions   Prednisone Other (See Comments)    Changed personality    Dilaudid [Hydromorphone] Itching and Other (See Comments)    Dilaudid caused marked confusion   Atrovent Hfa [Ipratropium Bromide Hfa] Itching and Other (See Comments)    Pt could not sleep   Cheratussin Ac [Guaifenesin-Codeine] Other (See Comments)    Headaches    Diltiazem Itching and Other (See Comments)    Makes patient sick. Shakes. GI   Hydrocodone Itching   Influenza Vaccines Other (See Comments)    Pt reports heart attack after last flu shot   Morphine Itching   Motrin [Ibuprofen] Itching  and Other (See Comments)    "Gives false reading in blood"   Oxycodone Itching   Codeine Itching, Nausea Only and Other (See Comments)    Pt takes promethazine with codeine at home   Current Outpatient Medications  Medication Sig Dispense Refill Last Dose   amoxicillin-clavulanate (AUGMENTIN) 875-125 MG tablet Take 1 tablet by mouth 2 (two) times daily. 20 tablet 0    aspirin EC 81 MG tablet Take 81 mg by mouth daily. Swallow whole.      benzonatate (TESSALON PERLES) 100 MG capsule Take 1 capsule (100 mg total) by mouth 3 (three) times daily as needed for cough. 20 capsule 0    ferrous sulfate 325 (65 FE) MG tablet Take 1 tablet (325 mg total) by mouth 2 (two) times daily with a meal. 60 tablet 2    loperamide (IMODIUM) 2 MG capsule Take 2 capsules (4 mg total) by mouth every 6 (six) hours as needed for diarrhea or loose stools (Use if >370mL ileostomy output every 6 hours). 90 capsule 3    methocarbamol (ROBAXIN) 500 MG tablet Take 1 tablet (500 mg total) by mouth every 6 (six)  hours as needed for muscle spasms. 20 tablet 2    OFEV 100 MG CAPS Take 100 mg by mouth daily.      ondansetron (ZOFRAN) 4 MG tablet TAKE 1 TABLET BY MOUTH EVERY 8 HOURS AS NEEDED FOR NAUSEA AND VOMITING 20 tablet 0    sodium chloride (OCEAN) 0.65 % SOLN nasal spray Place 1 spray into both nostrils as needed for congestion.      SYNTHROID 112 MCG tablet TAKE 1 TABLET BY MOUTH EVERY DAY BEFORE BREAKFAST 90 tablet 0    traMADol (ULTRAM) 50 MG tablet Take 1-2 tablets (50-100 mg total) by mouth every 6 (six) hours as needed for moderate pain. 20 tablet 0    vitamin C (ASCORBIC ACID) 500 MG tablet Take 500 mg by mouth daily.      No current facility-administered medications for this encounter.   ROS  Review of Systems  Constitutional:  Positive for fatigue.       States she is tired. Is sipping water and liquid IV hydration drink  Gastrointestinal:        LLQ ileostomy- some watery stools,   Genitourinary:         Urine is clear and yellow.   States output is normal  Skin:  Positive for color change.       Improved peristomal skin but remains itchy  Psychiatric/Behavioral:  Positive for dysphoric mood.   All other systems reviewed and are negative.  Vital signs:  BP (!) 157/70 (BP Location: Right Arm)   Pulse 70   Temp 97.6 F (36.4 C) (Oral)   Resp 20   SpO2 96%  Exam:  Physical Exam Vitals reviewed.  Constitutional:      Appearance: Normal appearance.  HENT:     Mouth/Throat:     Mouth: Mucous membranes are moist.  Cardiovascular:     Rate and Rhythm: Normal rate and regular rhythm.  Abdominal:     Palpations: Abdomen is soft.  Skin:    General: Skin is warm and dry.     Findings: Erythema present.  Neurological:     Mental Status: She is alert and oriented to person, place, and time.  Psychiatric:        Mood and Affect: Mood normal.        Behavior: Behavior normal.     Stoma type/location:  LLQ ileostomy Stomal assessment/size:  2.7 cm x 2 cm  Peristomal assessment:  peristomal irritation improved today.  Eakin seal is protecting skin  Treatment options for stomal/peristomal skin: eakin seal and 2 piece pouch Output: soft brown stool Ostomy pouching: 2pc.  Education provided:  given additional eakin seals for pouch changes  Skin improved but still itches occasionally.      Impression/dx  Contact dermatitis ileostomy Discussion  See back to recheck skin in one week.  Continue eakin seals Plan  Appointment scheduled    Visit time: 35 minutes.   Maple Hudson FNP-BC

## 2022-10-21 NOTE — Discharge Instructions (Signed)
Continue eakin seal

## 2022-10-23 ENCOUNTER — Ambulatory Visit (HOSPITAL_COMMUNITY)
Admission: RE | Admit: 2022-10-23 | Discharge: 2022-10-23 | Disposition: A | Discharge status: Home / Self Care | Payer: Medicare Other | Source: Ambulatory Visit | Attending: Family Medicine | Admitting: Family Medicine

## 2022-10-23 DIAGNOSIS — Z432 Encounter for attention to ileostomy: Secondary | ICD-10-CM | POA: Diagnosis not present

## 2022-10-23 DIAGNOSIS — L24B3 Irritant contact dermatitis related to fecal or urinary stoma or fistula: Secondary | ICD-10-CM

## 2022-10-23 DIAGNOSIS — L249 Irritant contact dermatitis, unspecified cause: Secondary | ICD-10-CM | POA: Diagnosis not present

## 2022-10-23 NOTE — Progress Notes (Signed)
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 the left ventricular wall motion is normal   Hypothyroidism    IPF (idiopathic pulmonary fibrosis) (HCC) 01/2018   Myocardial infarction (HCC)    hx of 30 years ago   Pancreatitis    Thyroid disease    TIA (transient ischemic attack)    8 years ago   Family History  Problem Relation Age of Onset   Ovarian cancer Mother    Uterine cancer Mother    Lung cancer Father    HIV Brother        45   Kidney cancer Brother    Lung cancer Brother    Other Brother        Mouth Cancer   Colon cancer Maternal Aunt    Heart disease Maternal Grandmother    Stroke Maternal Grandmother    Hypertension Maternal Grandmother    Diabetes Maternal Grandmother    Arthritis Other    Esophageal cancer Neg Hx    Liver disease Neg Hx    Rectal cancer Neg Hx    Stomach cancer Neg Hx    Allergies  Allergen Reactions   Prednisone Other (See Comments)    Changed personality    Dilaudid [Hydromorphone] Itching and Other (See Comments)    Dilaudid caused marked confusion   Atrovent Hfa [Ipratropium Bromide Hfa] Itching and Other (See Comments)    Pt could not sleep   Cheratussin Ac [Guaifenesin-Codeine] Other (See Comments)    Headaches    Diltiazem Itching and Other (See Comments)    Makes patient sick. Shakes. GI   Hydrocodone Itching   Influenza Vaccines Other (See Comments)    Pt reports heart attack after last flu shot   Morphine Itching   Motrin [Ibuprofen]  Itching and Other (See Comments)    "Gives false reading in blood"   Oxycodone Itching   Codeine Itching, Nausea Only and Other (See Comments)    Pt takes promethazine with codeine at home   Current Outpatient Medications  Medication Sig Dispense Refill Last Dose   amoxicillin-clavulanate (AUGMENTIN) 875-125 MG tablet Take 1 tablet by mouth 2 (two) times daily. 20 tablet 0    aspirin EC 81 MG tablet Take 81 mg by mouth daily. Swallow whole.      benzonatate (TESSALON PERLES) 100 MG capsule Take 1 capsule (100 mg total) by mouth 3 (three) times daily as needed for cough. 20 capsule 0    ferrous sulfate 325 (65 FE) MG tablet Take 1 tablet (325 mg total) by mouth 2 (two) times daily with a meal. 60 tablet 2    loperamide (IMODIUM) 2 MG capsule Take 2 capsules (4 mg total) by mouth every 6 (six) hours as needed for diarrhea or loose stools (Use if >317mL ileostomy output every 6 hours). 90 capsule 3    methocarbamol (ROBAXIN) 500 MG tablet Take 1 tablet (500 mg total) by mouth every 6 (  six) hours as needed for muscle spasms. 20 tablet 2    OFEV 100 MG CAPS Take 100 mg by mouth daily.      ondansetron (ZOFRAN) 4 MG tablet TAKE 1 TABLET BY MOUTH EVERY 8 HOURS AS NEEDED FOR NAUSEA AND VOMITING 20 tablet 0    sodium chloride (OCEAN) 0.65 % SOLN nasal spray Place 1 spray into both nostrils as needed for congestion.      SYNTHROID 112 MCG tablet TAKE 1 TABLET BY MOUTH EVERY DAY BEFORE BREAKFAST 90 tablet 0    traMADol (ULTRAM) 50 MG tablet Take 1-2 tablets (50-100 mg total) by mouth every 6 (six) hours as needed for moderate pain. 20 tablet 0    vitamin C (ASCORBIC ACID) 500 MG tablet Take 500 mg by mouth daily.      No current facility-administered medications for this encounter.   ROS  Review of Systems  Constitutional:  Positive for fatigue.  Gastrointestinal:        LLQ ileostomy  Skin:        Itching around stoma, no redness, irritation or denuded skin noted  Psychiatric/Behavioral:  Negative.    All other systems reviewed and are negative.  Vital signs:  There were no vitals taken for this visit. Exam:  Physical Exam Constitutional:      Appearance: Normal appearance.  HENT:     Mouth/Throat:     Mouth: Mucous membranes are moist.  Abdominal:     Palpations: Abdomen is soft.  Skin:    General: Skin is warm and dry.     Comments: Itching around stoma  Neurological:     Mental Status: She is alert and oriented to person, place, and time.  Psychiatric:        Mood and Affect: Mood normal.        Behavior: Behavior normal.     Stoma type/location:  LLQ loop ileostomy Stomal assessment/size:  pink and moist, productive Peristomal assessment:  intact, irritation has resolved but she still has some intermittent itching.  There is no redness, open skin or inflammation noted today.  We have neem using an eakin seal around stoma Treatment options for stomal/peristomal skin: continue eakin seal for another week.  Output: soft brown stool Ostomy pouching: 2pc.  Education provided:  eakin seal seems to be creating improved seal and skin irritation is resolved.  Will continue eakin seal one more week to improve itching    Impression/dx  Irritant contact dermatitis ileostomy Discussion  See above Plan  See back one more week to assess skin    Visit time: 45 minutes.   Maple Hudson FNP-BC

## 2022-10-27 ENCOUNTER — Ambulatory Visit (HOSPITAL_COMMUNITY)
Admission: RE | Admit: 2022-10-27 | Discharge: 2022-10-27 | Disposition: A | Discharge status: Home / Self Care | Payer: Medicare Other | Source: Ambulatory Visit | Attending: Nurse Practitioner | Admitting: Nurse Practitioner

## 2022-10-27 DIAGNOSIS — L24B3 Irritant contact dermatitis related to fecal or urinary stoma or fistula: Secondary | ICD-10-CM | POA: Diagnosis not present

## 2022-10-27 DIAGNOSIS — Z432 Encounter for attention to ileostomy: Secondary | ICD-10-CM

## 2022-10-27 DIAGNOSIS — Z932 Ileostomy status: Secondary | ICD-10-CM | POA: Diagnosis present

## 2022-10-27 NOTE — Progress Notes (Signed)
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 the left ventricular wall motion is normal   Hypothyroidism    IPF (idiopathic pulmonary fibrosis) (HCC) 01/2018   Myocardial infarction (HCC)    hx of 30 years ago   Pancreatitis    Thyroid disease    TIA (transient ischemic attack)    8 years ago   Family History  Problem Relation Age of Onset   Ovarian cancer Mother    Uterine cancer Mother    Lung cancer Father    HIV Brother        45   Kidney cancer Brother    Lung cancer Brother    Other Brother        Mouth Cancer   Colon cancer Maternal Aunt    Heart disease Maternal Grandmother    Stroke Maternal Grandmother    Hypertension Maternal Grandmother    Diabetes Maternal Grandmother    Arthritis Other    Esophageal cancer Neg Hx    Liver disease Neg Hx    Rectal cancer Neg Hx    Stomach cancer Neg Hx    Allergies  Allergen Reactions   Prednisone Other (See Comments)    Changed personality    Dilaudid [Hydromorphone] Itching and Other (See Comments)    Dilaudid caused marked confusion   Atrovent Hfa [Ipratropium Bromide Hfa] Itching and Other (See Comments)    Pt could not sleep   Cheratussin Ac [Guaifenesin-Codeine] Other (See Comments)    Headaches    Diltiazem Itching and Other (See Comments)    Makes patient sick. Shakes. GI   Hydrocodone Itching   Influenza Vaccines Other (See Comments)    Pt reports heart attack after last flu shot   Morphine Itching   Motrin [Ibuprofen]  Itching and Other (See Comments)    "Gives false reading in blood"   Oxycodone Itching   Codeine Itching, Nausea Only and Other (See Comments)    Pt takes promethazine with codeine at home   Current Outpatient Medications  Medication Sig Dispense Refill Last Dose   amoxicillin-clavulanate (AUGMENTIN) 875-125 MG tablet Take 1 tablet by mouth 2 (two) times daily. 20 tablet 0    aspirin EC 81 MG tablet Take 81 mg by mouth daily. Swallow whole.      benzonatate (TESSALON PERLES) 100 MG capsule Take 1 capsule (100 mg total) by mouth 3 (three) times daily as needed for cough. 20 capsule 0    ferrous sulfate 325 (65 FE) MG tablet Take 1 tablet (325 mg total) by mouth 2 (two) times daily with a meal. 60 tablet 2    loperamide (IMODIUM) 2 MG capsule Take 2 capsules (4 mg total) by mouth every 6 (six) hours as needed for diarrhea or loose stools (Use if >317mL ileostomy output every 6 hours). 90 capsule 3    methocarbamol (ROBAXIN) 500 MG tablet Take 1 tablet (500 mg total) by mouth every 6 (  six) hours as needed for muscle spasms. 20 tablet 2    OFEV 100 MG CAPS Take 100 mg by mouth daily.      ondansetron (ZOFRAN) 4 MG tablet TAKE 1 TABLET BY MOUTH EVERY 8 HOURS AS NEEDED FOR NAUSEA AND VOMITING 20 tablet 0    sodium chloride (OCEAN) 0.65 % SOLN nasal spray Place 1 spray into both nostrils as needed for congestion.      SYNTHROID 112 MCG tablet TAKE 1 TABLET BY MOUTH EVERY DAY BEFORE BREAKFAST 90 tablet 0    traMADol (ULTRAM) 50 MG tablet Take 1-2 tablets (50-100 mg total) by mouth every 6 (six) hours as needed for moderate pain. 20 tablet 0    vitamin C (ASCORBIC ACID) 500 MG tablet Take 500 mg by mouth daily.      No current facility-administered medications for this encounter.   ROS  Review of Systems  Constitutional:  Positive for fatigue.  Gastrointestinal:        LLQ ileostomy  Skin:        Itching around stoma, no redness, irritation or denuded skin noted  Psychiatric/Behavioral:  Negative.    All other systems reviewed and are negative.  Vital signs:  There were no vitals taken for this visit. Exam:  Physical Exam Constitutional:      Appearance: Normal appearance.  HENT:     Mouth/Throat:     Mouth: Mucous membranes are moist.  Abdominal:     Palpations: Abdomen is soft.  Skin:    General: Skin is warm and dry.     Comments: Itching around stoma  Neurological:     Mental Status: She is alert and oriented to person, place, and time.  Psychiatric:        Mood and Affect: Mood normal.        Behavior: Behavior normal.     Stoma type/location:  LLQ loop ileostomy Stomal assessment/size:  pink and moist, productive Peristomal assessment:  intact, irritation has resolved but she still has some intermittent itching.  There is no redness, open skin or inflammation noted today.  We have neem using an eakin seal around stoma Treatment options for stomal/peristomal skin: continue eakin seal for another week.  Output: soft brown stool Ostomy pouching: 2pc.  Education provided:  eakin seal seems to be creating improved seal and skin irritation is resolved.  Will continue eakin seal one more week to improve itching    Impression/dx  Irritant contact dermatitis ileostomy Discussion  See above Plan  See back one more week to assess skin    Visit time: 45 minutes.   Maple Hudson FNP-BC

## 2022-10-30 ENCOUNTER — Other Ambulatory Visit: Payer: Self-pay | Admitting: Family Medicine

## 2022-10-30 ENCOUNTER — Ambulatory Visit (HOSPITAL_COMMUNITY)
Admission: RE | Admit: 2022-10-30 | Discharge: 2022-10-30 | Disposition: A | Discharge status: Home / Self Care | Payer: Medicare Other | Source: Ambulatory Visit | Attending: Nurse Practitioner | Admitting: Nurse Practitioner

## 2022-10-30 DIAGNOSIS — L24B3 Irritant contact dermatitis related to fecal or urinary stoma or fistula: Secondary | ICD-10-CM | POA: Insufficient documentation

## 2022-10-30 DIAGNOSIS — Z432 Encounter for attention to ileostomy: Secondary | ICD-10-CM

## 2022-10-30 DIAGNOSIS — Z932 Ileostomy status: Secondary | ICD-10-CM | POA: Diagnosis present

## 2022-10-30 DIAGNOSIS — J0140 Acute pansinusitis, unspecified: Secondary | ICD-10-CM

## 2022-10-30 NOTE — Progress Notes (Signed)
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 the left ventricular wall motion is normal   Hypothyroidism    IPF (idiopathic pulmonary fibrosis) (HCC) 01/2018   Myocardial infarction (HCC)    hx of 30 years ago   Pancreatitis    Thyroid disease    TIA (transient ischemic attack)    8 years ago   Family History  Problem Relation Age of Onset   Ovarian cancer Mother    Uterine cancer Mother    Lung cancer Father    HIV Brother        72   Kidney cancer Brother    Lung cancer Brother    Other Brother        Mouth Cancer   Colon cancer Maternal Aunt    Heart disease Maternal Grandmother    Stroke Maternal Grandmother    Hypertension Maternal Grandmother    Diabetes Maternal Grandmother    Arthritis Other    Esophageal cancer Neg Hx    Liver disease Neg Hx    Rectal cancer Neg Hx    Stomach cancer Neg Hx    Allergies  Allergen Reactions   Prednisone Other (See Comments)    Changed personality    Dilaudid [Hydromorphone] Itching and Other (See Comments)    Dilaudid caused marked confusion   Atrovent Hfa [Ipratropium Bromide Hfa] Itching and Other (See Comments)    Pt could not sleep   Cheratussin Ac [Guaifenesin-Codeine] Other (See Comments)    Headaches    Diltiazem Itching and Other (See Comments)    Makes patient sick. Shakes. GI   Hydrocodone Itching   Influenza Vaccines Other (See Comments)    Pt reports heart attack after last flu shot   Morphine Itching   Motrin [Ibuprofen] Itching  and Other (See Comments)    "Gives false reading in blood"   Oxycodone Itching   Codeine Itching, Nausea Only and Other (See Comments)    Pt takes promethazine with codeine at home   Current Outpatient Medications  Medication Sig Dispense Refill Last Dose   amoxicillin-clavulanate (AUGMENTIN) 875-125 MG tablet Take 1 tablet by mouth 2 (two) times daily. 20 tablet 0    aspirin EC 81 MG tablet Take 81 mg by mouth daily. Swallow whole.      benzonatate (TESSALON PERLES) 100 MG capsule Take 1 capsule (100 mg total) by mouth 3 (three) times daily as needed for cough. 20 capsule 0    ferrous sulfate 325 (65 FE) MG tablet Take 1 tablet (325 mg total) by mouth 2 (two) times daily with a meal. 60 tablet 2    loperamide (IMODIUM) 2 MG capsule Take 2 capsules (4 mg total) by mouth every 6 (six) hours as needed for diarrhea or loose stools (Use if >366mL ileostomy output every 6 hours). 90 capsule 3    methocarbamol (ROBAXIN) 500 MG tablet Take 1 tablet (500 mg total) by mouth every 6 (six)  hours as needed for muscle spasms. 20 tablet 2    OFEV 100 MG CAPS Take 100 mg by mouth daily.      ondansetron (ZOFRAN) 4 MG tablet TAKE 1 TABLET BY MOUTH EVERY 8 HOURS AS NEEDED FOR NAUSEA AND VOMITING 20 tablet 0    sodium chloride (OCEAN) 0.65 % SOLN nasal spray Place 1 spray into both nostrils as needed for congestion.      SYNTHROID 112 MCG tablet TAKE 1 TABLET BY MOUTH EVERY DAY BEFORE BREAKFAST 90 tablet 0    traMADol (ULTRAM) 50 MG tablet Take 1-2 tablets (50-100 mg total) by mouth every 6 (six) hours as needed for moderate pain. 20 tablet 0    vitamin C (ASCORBIC ACID) 500 MG tablet Take 500 mg by mouth daily.      No current facility-administered medications for this encounter.   ROS  Review of Systems  Constitutional:  Positive for fatigue.  Respiratory:  Positive for cough.   Gastrointestinal:        LLQ ileostomy  Skin:        Itching around stoma.  Using Ceraplus barrier with ceramides to help   All other systems reviewed and are negative.  Vital signs:  BP (!) 176/99 (BP Location: Right Arm)   Pulse 71   Temp 98 F (36.7 C) (Oral)   Resp 20   SpO2 95%  Exam:  Physical Exam Vitals reviewed.  Constitutional:      Appearance: Normal appearance.  Pulmonary:     Breath sounds: Normal breath sounds.  Abdominal:     Palpations: Abdomen is soft.  Skin:    General: Skin is warm and dry.     Comments: Itching around stoma.  No redness, or irritation noted  Neurological:     Mental Status: She is alert and oriented to person, place, and time.  Psychiatric:        Mood and Affect: Mood normal.        Behavior: Behavior normal.     Stoma type/location:  LLQ loop ileostomy Stomal assessment/size:  oval 2 cmx 1.5 cm  Peristomal assessment:  intact, less itching today Treatment options for stomal/peristomal skin: 2 piece pouch with stoma powder and skin prep Output: thick brown stool today, fluctuates with liquid effluent Ostomy pouching: 2pc.  Education provided:  Awaiting reversal, continues to manage constipation vs high output and itching, irritation to peristomal skin without obvious symptoms or cause.     Impression/dx  Ileostomy dermatitis Discussion  See back 2 weeks.  Upcoming reversal planned.  Plan  Call as needed,  Continues to manage output with stool softeners, fiber and immodium as needed.    Visit time: 35 minutes.   Maple Hudson FNP-BC

## 2022-11-10 ENCOUNTER — Ambulatory Visit (HOSPITAL_COMMUNITY): Payer: Medicare Other | Admitting: Nurse Practitioner

## 2022-11-10 ENCOUNTER — Other Ambulatory Visit: Payer: Self-pay | Admitting: Family Medicine

## 2022-11-10 DIAGNOSIS — J0140 Acute pansinusitis, unspecified: Secondary | ICD-10-CM

## 2022-11-10 NOTE — Patient Instructions (Signed)
SURGICAL WAITING ROOM VISITATION Patients having surgery or a procedure may have no more than 2 support people in the waiting area - these visitors may rotate.    Children under the age of 71 must have an adult with them who is not the patient.  If the patient needs to stay at the hospital during part of their recovery, the visitor guidelines for inpatient rooms apply. Pre-op nurse will coordinate an appropriate time for 1 support person to accompany patient in pre-op.  This support person may not rotate.    Please refer to the [facility] website for the visitor guidelines for Inpatients (after your surgery is over and you are in a regular room).       Your procedure is scheduled on: 11-19-22   Report to [facility] Main Entrance    Report to admitting at 5:15 AM   Call this number if you have problems the morning of surgery [phone]   Do not eat food :After Midnight.   After Midnight you may have the following liquids until 4:30 AM DAY OF SURGERY  Water Non-Citrus Juices (without pulp, NO RED-Apple, White grape, White cranberry) Black Coffee (NO MILK/CREAM OR CREAMERS, sugar ok)  Clear Tea (NO MILK/CREAM OR CREAMERS, sugar ok) regular and decaf                             Plain Jell-O (NO RED)                                           Fruit ices (not with fruit pulp, NO RED)                                     Popsicles (NO RED)                                                               Sports drinks like Gatorade (NO RED)  Drink 2 Pre-Surgery Clear Ensure the evening before surgery                    The day of surgery:  Drink ONE (1) Pre-Surgery Clear Ensure at 4:30 AM the morning of surgery. Drink in one sitting. Do not sip.  This drink was given to you during your hospital  pre-op appointment visit. Nothing else to drink after completing the Pre-Surgery Clear Ensure.          If you have questions, please contact your surgeon's office.   FOLLOW BOWEL  PREP AND ANY ADDITIONAL PRE OP INSTRUCTIONS YOU RECEIVED FROM YOUR SURGEON'S OFFICE!!!     Oral Hygiene is also important to reduce your risk of infection.                                    Remember - BRUSH YOUR TEETH THE MORNING OF SURGERY WITH YOUR REGULAR TOOTHPASTE   Do NOT smoke after Midnight   Take these medicines the morning of  surgery with A SIP OF WATER:   Synthroid  Ondansetron if needed  Stop all vitamins and herbal supplements 7 days before surgery                              You may not have any metal on your body including hair pins, jewelry, and body piercing             Do not wear make-up, lotions, powders, perfumes or deodorant  Do not wear nail polish including gel and S&S, artificial/acrylic nails, or any other type of covering on natural nails including finger and toenails. If you have artificial nails, gel coating, etc. that needs to be removed by a nail salon please have this removed prior to surgery or surgery may need to be canceled/ delayed if the surgeon/ anesthesia feels like they are unable to be safely monitored.   Do not shave  48 hours prior to surgery.    Do not bring valuables to the hospital. [facility] IS NOT RESPONSIBLE   FOR VALUABLES.   Contacts, dentures or bridgework may not be worn into surgery.   Bring small overnight bag day of surgery.   DO NOT BRING YOUR HOME MEDICATIONS TO THE HOSPITAL. PHARMACY WILL DISPENSE MEDICATIONS LISTED ON YOUR MEDICATION LIST TO YOU DURING YOUR ADMISSION IN THE HOSPITAL!    Special Instructions: Bring a copy of your healthcare power of attorney and living will documents the day of surgery if you haven't scanned them before.              Please read over the following fact sheets you were given: IF YOU HAVE QUESTIONS ABOUT YOUR PRE-OP INSTRUCTIONS PLEASE CALL [phone] Rosey Bath  If you received a COVID test during your pre-op visit  it is requested that you wear a mask when out in public, stay away from  anyone that may not be feeling well and notify your surgeon if you develop symptoms. If you test positive for Covid or have been in contact with anyone that has tested positive in the last 10 days please notify you surgeon.  [facility] - Preparing for Surgery Before surgery, you can play an important role.  Because skin is not sterile, your skin needs to be as free of germs as possible.  You can reduce the number of germs on your skin by washing with CHG (chlorahexidine gluconate) soap before surgery.  CHG is an antiseptic cleaner which kills germs and bonds with the skin to continue killing germs even after washing. Please DO NOT use if you have an allergy to CHG or antibacterial soaps.  If your skin becomes reddened/irritated stop using the CHG and inform your nurse when you arrive at Short Stay. Do not shave (including legs and underarms) for at least 48 hours prior to the first CHG shower.  You may shave your face/neck.  Please follow these instructions carefully:  1.  Shower with CHG Soap the night before surgery and the  morning of surgery.  2.  If you choose to wash your hair, wash your hair first as usual with your normal  shampoo.  3.  After you shampoo, rinse your hair and body thoroughly to remove the shampoo.                             4.  Use CHG as you would any other liquid  soap.  You can apply chg directly to the skin and wash.  Gently with a scrungie or clean washcloth.  5.  Apply the CHG Soap to your body ONLY FROM THE NECK DOWN.   Do   not use on face/ open                           Wound or open sores. Avoid contact with eyes, ears mouth and   genitals (private parts).                       Wash face,  Genitals (private parts) with your normal soap.             6.  Wash thoroughly, paying special attention to the area where your    surgery  will be performed.  7.  Thoroughly rinse your body with warm water from the neck down.  8.  DO NOT shower/wash with your normal soap after  using and rinsing off the CHG Soap.                9.  Pat yourself dry with a clean towel.            10.  Wear clean pajamas.            11.  Place clean sheets on your bed the night of your first shower and do not  sleep with pets. Day of Surgery : Do not apply any lotions/deodorants the morning of surgery.  Please wear clean clothes to the hospital/surgery center.  FAILURE TO FOLLOW THESE INSTRUCTIONS MAY RESULT IN THE CANCELLATION OF YOUR SURGERY  PATIENT SIGNATURE_________________________________  NURSE SIGNATURE__________________________________  ________________________________________________________________________    Alexandria [name]  An incentive spirometer is a tool that can help keep your lungs clear and active. This tool measures how well you are filling your lungs with each breath. Taking long deep breaths may help reverse or decrease the chance of developing breathing (pulmonary) problems (especially infection) following: A long period of time when you are unable to move or be active. BEFORE THE PROCEDURE  If the spirometer includes an indicator to show your best effort, your nurse or respiratory therapist will set it to a desired goal. If possible, sit up straight or lean slightly forward. Try not to slouch. Hold the incentive spirometer in an upright position. INSTRUCTIONS FOR USE  Sit on the edge of your bed if possible, or sit up as far as you can in bed or on a chair. Hold the incentive spirometer in an upright position. Breathe out normally. Place the mouthpiece in your mouth and seal your lips tightly around it. Breathe in slowly and as deeply as possible, raising the piston or the ball toward the top of the column. Hold your breath for 3-5 seconds or for as long as possible. Allow the piston or ball to fall to the bottom of the column. Remove the mouthpiece from your mouth and breathe out normally. Rest for a few seconds and repeat Steps 1 through 7 at  least 10 times every 1-2 hours when you are awake. Take your time and take a few normal breaths between deep breaths. The spirometer may include an indicator to show your best effort. Use the indicator as a goal to work toward during each repetition. After each set of 10 deep breaths, practice coughing to be sure your lungs are clear. If you have an incision (  the cut made at the time of surgery), support your incision when coughing by placing a pillow or rolled up towels firmly against it. Once you are able to get out of bed, walk around indoors and cough well. You may stop using the incentive spirometer when instructed by your caregiver.  RISKS AND COMPLICATIONS Take your time so you do not get dizzy or light-headed. If you are in pain, you may need to take or ask for pain medication before doing incentive spirometry. It is harder to take a deep breath if you are having pain. AFTER USE Rest and breathe slowly and easily. It can be helpful to keep track of a log of your progress. Your caregiver can provide you with a simple table to help with this. If you are using the spirometer at home, follow these instructions: SEEK MEDICAL CARE IF:  You are having difficultly using the spirometer. You have trouble using the spirometer as often as instructed. Your pain medication is not giving enough relief while using the spirometer. You develop fever of 100.5 F (38.1 C) or higher. SEEK IMMEDIATE MEDICAL CARE IF:  You cough up bloody sputum that had not been present before. You develop fever of 102 F (38.9 C) or greater. You develop worsening pain at or near the incision site. MAKE SURE YOU:  Understand these instructions. Will watch your condition. Will get help right away if you are not doing well or get worse. Document Released: 08/03/2006 Document Revised: 06/15/2011 Document Reviewed: 10/04/2006 [facility] Patient Information 2014 [place],  [place].   ________________________________________________________________________

## 2022-11-11 ENCOUNTER — Encounter (HOSPITAL_COMMUNITY): Payer: Self-pay

## 2022-11-11 ENCOUNTER — Other Ambulatory Visit: Payer: Self-pay

## 2022-11-11 ENCOUNTER — Encounter (HOSPITAL_COMMUNITY)
Admission: RE | Admit: 2022-11-11 | Discharge: 2022-11-11 | Disposition: A | Discharge status: Home / Self Care | Payer: Medicare Other | Source: Ambulatory Visit | Attending: Surgery | Admitting: Surgery

## 2022-11-11 VITALS — BP 180/90 | HR 74 | Temp 97.9°F | Resp 18 | Ht 63.0 in | Wt 138.0 lb

## 2022-11-11 DIAGNOSIS — I10 Essential (primary) hypertension: Secondary | ICD-10-CM | POA: Insufficient documentation

## 2022-11-11 DIAGNOSIS — Z01818 Encounter for other preprocedural examination: Secondary | ICD-10-CM | POA: Diagnosis present

## 2022-11-11 DIAGNOSIS — Z432 Encounter for attention to ileostomy: Secondary | ICD-10-CM | POA: Diagnosis not present

## 2022-11-11 DIAGNOSIS — Z01812 Encounter for preprocedural laboratory examination: Secondary | ICD-10-CM | POA: Diagnosis not present

## 2022-11-11 LAB — BASIC METABOLIC PANEL
Anion gap: 7 (ref 5–15)
BUN: 17 mg/dL (ref 8–23)
CO2: 26 mmol/L (ref 22–32)
Calcium: 9.3 mg/dL (ref 8.9–10.3)
Chloride: 105 mmol/L (ref 98–111)
Creatinine, Ser: 0.76 mg/dL (ref 0.44–1.00)
GFR, Estimated: >60 mL/min (ref >60)
Glucose, Bld: 80 mg/dL (ref 70–99)
Potassium: 4.4 mmol/L (ref 3.5–5.1)
Sodium: 138 mmol/L (ref 135–145)

## 2022-11-11 LAB — CBC
HCT: 42.3 % (ref 36.0–46.0)
Hemoglobin: 13.2 g/dL (ref 12.0–15.0)
MCH: 31.6 pg (ref 26.0–34.0)
MCHC: 31.2 g/dL (ref 30.0–36.0)
MCV: 101.2 fL — ABNORMAL HIGH (ref 80.0–100.0)
Platelets: 225 10*3/uL (ref 150–400)
RBC: 4.18 MIL/uL (ref 3.87–5.11)
RDW: 15.1 % (ref 11.5–15.5)
WBC: 6.2 10*3/uL (ref 4.0–10.5)
nRBC: 0 % (ref 0.0–0.2)

## 2022-11-11 NOTE — Progress Notes (Addendum)
COVID Vaccine received:  [x]  No []  Yes Date of any COVID positive Test in last 90 days: No PCP - Florina Ou DO Cardiologist - Kardie Tobb DO  Chest x-ray - 09/22/22 Epic EKG -  07/13/22 Epic Stress Test - 10/13/13 Epic ECHO - 07/09/22 EPIC Cardiac Cath - 10/25/13 EPIC  Bowel Prep - [x]  No  []   Yes ______  Pacemaker / ICD device [x]  No []  Yes   Spinal Cord Stimulator:[x]  No []  Yes       History of Sleep Apnea? []  No []  Yes   CPAP used?- [x]  No []  Yes    Does the patient monitor blood sugar?          [x]  No []  Yes  []  N/A  Patient has: [x]  NO Hx DM   []  Pre-DM                 []  DM1  []   DM2 Does patient have a [employer] or Dexacom? []  No []  Yes   Fasting Blood Sugar Ranges-  Checks Blood Sugar _____ times a day  GLP1 agonist / usual dose - No GLP1 instructions:  SGLT-2 inhibitors / usual dose - No SGLT-2 instructions:   Blood Thinner / Instructions:No Aspirin Instructions:No, not currently taking  Comments:   Activity level: Patient is able to climb a flight of stairs without difficulty; [x]  No CP  [x]  No SOB Patient can  perform ADLs without assistance.   Anesthesia review: HTN, COPD, MI, CHF, AAA, murmur  Patient denies shortness of breath, fever, cough and chest pain at PAT appointment.  Patient verbalized understanding and agreement to the Pre-Surgical Instructions that were given to them at this PAT appointment. Patient was also educated of the need to review these PAT instructions again prior to his/her surgery.I reviewed the appropriate phone numbers to call if they have any and questions or concerns.

## 2022-11-13 ENCOUNTER — Ambulatory Visit (HOSPITAL_COMMUNITY)
Admission: RE | Admit: 2022-11-13 | Discharge: 2022-11-13 | Disposition: A | Discharge status: Home / Self Care | Payer: Medicare Other | Source: Ambulatory Visit | Attending: Family Medicine | Admitting: Family Medicine

## 2022-11-13 ENCOUNTER — Encounter (HOSPITAL_COMMUNITY): Payer: Self-pay

## 2022-11-13 DIAGNOSIS — L24B3 Irritant contact dermatitis related to fecal or urinary stoma or fistula: Secondary | ICD-10-CM

## 2022-11-13 DIAGNOSIS — Z932 Ileostomy status: Secondary | ICD-10-CM | POA: Diagnosis not present

## 2022-11-13 DIAGNOSIS — Z933 Colostomy status: Secondary | ICD-10-CM | POA: Diagnosis not present

## 2022-11-13 NOTE — Progress Notes (Signed)
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 the left ventricular wall motion is normal   Hypothyroidism    IPF (idiopathic pulmonary fibrosis) (HCC) 01/2018   Myocardial infarction (HCC)    hx of 30 years ago   Pancreatitis    TIA (transient ischemic attack)    8 years ago   Family History  Problem Relation Age of Onset   Ovarian cancer Mother    Uterine cancer Mother    Lung cancer Father    HIV Brother        70   Kidney cancer Brother    Lung cancer Brother    Other Brother        Mouth Cancer   Colon cancer Maternal Aunt    Heart disease Maternal Grandmother    Stroke Maternal Grandmother    Hypertension Maternal Grandmother    Diabetes Maternal Grandmother    Arthritis Other    Esophageal cancer Neg Hx    Liver disease Neg Hx    Rectal cancer Neg Hx    Stomach cancer Neg Hx    Allergies  Allergen Reactions   Prednisone Other (See Comments)    Changed personality    Dilaudid [Hydromorphone] Itching and Other (See Comments)    Dilaudid caused marked confusion   Atrovent Hfa [Ipratropium Bromide Hfa] Itching and Other (See Comments)    Pt could not sleep, Insomnia    Cheratussin Ac [Guaifenesin-Codeine] Other (See Comments)    Headaches    Diltiazem Itching and Other (See Comments)    Makes patient sick. Shakes. GI   Hydrocodone Itching   Influenza Vaccines Other (See Comments)    Pt reports heart attack after  last flu shot   Morphine Itching   Motrin [Ibuprofen] Itching and Other (See Comments)    "Gives false reading in blood"   Oxycodone Itching   Codeine Itching, Nausea Only and Other (See Comments)    Pt takes promethazine with codeine at home   Current Outpatient Medications  Medication Sig Dispense Refill Last Dose   amoxicillin-clavulanate (AUGMENTIN) 875-125 MG tablet Take 1 tablet by mouth 2 (two) times daily. (Patient not taking: Reported on 11/04/2022) 20 tablet 0    aspirin EC 81 MG tablet Take 81 mg by mouth 3 (three) times a week. Swallow whole.      benzonatate (TESSALON PERLES) 100 MG capsule Take 1 capsule (100 mg total) by mouth 3 (three) times daily as needed for cough. (Patient not taking: Reported on 11/04/2022) 20 capsule 0    diphenoxylate-atropine (LOMOTIL) 2.5-0.025 MG tablet Take 1 tablet by mouth 4 (four) times daily.      ferrous sulfate 325 (65 FE) MG tablet Take 1 tablet (325 mg total) by mouth 2 (two) times daily with a meal. 60 tablet 2    loperamide (IMODIUM) 2 MG capsule Take 2 capsules (4 mg total) by mouth  every 6 (six) hours as needed for diarrhea or loose stools (Use if >337mL ileostomy output every 6 hours). (Patient not taking: Reported on 11/04/2022) 90 capsule 3    methocarbamol (ROBAXIN) 500 MG tablet Take 1 tablet (500 mg total) by mouth every 6 (six) hours as needed for muscle spasms. (Patient not taking: Reported on 11/04/2022) 20 tablet 2    Multiple Vitamin (MULTIVITAMIN WITH MINERALS) TABS tablet Take 1 tablet by mouth once a week.      OFEV 100 MG CAPS Take 100 mg by mouth 2 (two) times daily.      ondansetron (ZOFRAN) 4 MG tablet TAKE 1 TABLET BY MOUTH EVERY 8 HOURS AS NEEDED FOR NAUSEA AND VOMITING 20 tablet 0    potassium chloride SA (KLOR-CON M) 20 MEQ tablet Take 20 mEq by mouth daily.      SYNTHROID 112 MCG tablet TAKE 1 TABLET BY MOUTH EVERY DAY BEFORE BREAKFAST 90 tablet 0    traMADol (ULTRAM) 50 MG tablet Take 1-2 tablets (50-100 mg total) by  mouth every 6 (six) hours as needed for moderate pain. (Patient not taking: Reported on 11/04/2022) 20 tablet 0    vitamin C (ASCORBIC ACID) 500 MG tablet Take 500 mg by mouth 2 (two) times a week.      No current facility-administered medications for this encounter.   ROS  Review of Systems  Constitutional: Negative.   Gastrointestinal:        LLQ colostomy Peristomal skin breakdown  Musculoskeletal: Negative.   Psychiatric/Behavioral:  Positive for dysphoric mood.   All other systems reviewed and are negative.  Vital signs:  BP (!) 153/93   Pulse 77   Temp (!) 97.5 F (36.4 C)   Resp 18   SpO2 92%  Exam:  Physical Exam Vitals reviewed.  Constitutional:      Appearance: Normal appearance.  Abdominal:     General: Abdomen is flat.     Palpations: Abdomen is soft.  Skin:    General: Skin is warm and dry.     Findings: Lesion and rash present.     Comments: Atypical lesion (plaque) to left temple.  She will follow up with dermatology after surgery.    Neurological:     Mental Status: She is alert and oriented to person, place, and time.  Psychiatric:        Mood and Affect: Mood normal.        Behavior: Behavior normal.     Stoma type/location:  LLQ loop ileostomy Stomal assessment/size:  oval 2.5 cm x 1.5 cm  Peristomal assessment:  denuded skin from 2 to 5 o'clock, tender, weeping.  States neighbor was going to help with pouch change and did not come.  Stool was on exposed skin today.  Treatment options for stomal/peristomal skin: Applied stoma powder and skin prep to denuded skin.  Barrier ring around stoma  2 piece 2 1/4" pouch Output: soft brown stool  Ostomy pouching: 2pc. 2 1/4" pouch with barrier ring  Education provided:  Reversal planned in 6 days.  She is anxious and "cranky", she states.  Emotional support provided regarding upcoming surgery.  She understands how to use stoma powder and skin prep to skin breakdown, she states.     Impression/dx  Contact  dermatitis ileostomy Discussion  See above  emotional support  address skin breakdown Plan  See back as needed  See next week to check contact dermatitis, prior to surgery.     Visit time: 45 minutes.  Maple Hudson FNP-BC

## 2022-11-13 NOTE — Progress Notes (Signed)
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 PMH significant for for history of CAD, MI, pulmonary fibrosis, hypothyroidism, heart murmur, history of DVT not currently anticoagulated, history of TIA, and hx of complicated diverticulitis s/p Hartmann's procedure. Colostomy take down was on 08/13/22 but was converted to an ileostomy due to dense adhesions. Now presenting for take down of ileostomy.  Prior anesthesia complications include prolonged emergence during colostomy takedown in May 2024. They would like to speak with the anesthesia team prior to surgery to make sure the have a less sedating protocol  Pt seen by cardiology 06/15/22 for evaluation of bradycardia.  Coronary angiography in 2015 did not show any evidence of stenosis. More recently coronary CTA showed a calcium score of 0 although she did have some mild aortic atherosclerosis. In regards to bradycardia, "Suspect that this is artifactual and related to "under counting" of premature beats when checking the peripheral pulse with an automatic blood pressure cuff for direct palpation of the pulse or pulse oximeter. She has not had symptoms of bradycardia. There is no EKG documentation of bradycardia during her prolonged hospitalization or multiple subsequent ER visits and ECGs. A few years ago and ". Euvolemic at this visit. Repeat echo was obtained on 07/09/2022 which showed EF 55-60%, mild mitral regurgitation, aortic regurg mild to moderate, no aortic stenosis.   Per Dr. Royann Shivers: "Overall the findings are very similar to the echocardiogram performed in March 2022, except the leak in the aortic valve may be  slightly worse. It is stil very unlikely to have any serious consequences.  I do not think it precludes the planned abdominal surgery.  Would probably want to take another look with echo in another year or two to ensure that it is not worsening, but there is no specific medical therapy needed for this."   Anticipate patient can proceed.   VS: BP (!) 180/90   Pulse 74   Temp 36.6 C (Oral)   Resp 18   Ht 5\' 3"  (1.6 m)   Wt 62.6 kg   SpO2 96%   BMI 24.45 kg/m   PROVIDERS: Donato Schultz, DO Cardiologist: Thomasene Ripple, DO   LABS: Labs reviewed: Acceptable for surgery. (all labs ordered are listed, but only abnormal results are displayed)  Labs Reviewed  CBC - Abnormal; Notable for the following components:      Result Value   MCV 101.2 (*)    All other components within normal limits  BASIC METABOLIC PANEL     IMAGES:  CXR 09/22/22:  IMPRESSION: Chronic interstitial lung disease with scarring. There is improvement in the aeration of left parahilar region, possibly suggesting interval improvement in interstitial pneumonia. There are no new focal pulmonary infiltrates.   EKG:   CV:  Echo 07/09/2022  1. Left ventricular ejection fraction, by estimation, is 55 to 60%. The  left ventricle has normal function. The left ventricle has no regional  wall motion abnormalities. There is mild concentric left ventricular  hypertrophy. Left ventricular diastolic  parameters are consistent with Grade I diastolic dysfunction (impaired  relaxation).  2. Right ventricular systolic function is normal. The right ventricular  size is normal. Tricuspid regurgitation signal is inadequate for assessing  PA pressure.   3. The mitral valve is normal in structure. Mild mitral valve  regurgitation. No evidence of mitral stenosis. Moderate mitral annular  calcification.   4. The aortic valve is tricuspid. There is mild calcification of the  aortic valve. Aortic valve regurgitation is  mild to moderate. No aortic  stenosis is present.   5. Aortic dilatation noted. There is mild dilatation of the ascending  aorta, measuring 42 mm.   6. IVC not visualized.   7. Frequent PACs noted    Past Medical History:  Diagnosis Date   Arthritis    Bronchial pneumonia    Chronic facial pain 08/14/2022   Colon polyp    Diverticulitis    DVT (deep venous thrombosis) (HCC)    lower extremity   H/O cardiac catheterization 2004   Normal coronary arteries   Heart murmur    History of stress test 05/21/2011   Hx of echocardiogram 01/27/2010   Normal Ef 55% the transmitral spectral doppler flow pattern is normal for age. the left ventricular wall motion is normal   Hypothyroidism    IPF (idiopathic pulmonary fibrosis) (HCC) 01/2018   Myocardial infarction (HCC)    hx of 30 years ago   Pancreatitis    Thyroid disease    TIA (transient ischemic attack)    8 years ago    Past Surgical History:  Procedure Laterality Date   BREAST BIOPSY Left    [facility]   CARDIAC CATHETERIZATION     10/2013   CATARACT EXTRACTION Bilateral 03/22/2018   FLEXIBLE SIGMOIDOSCOPY N/A 08/13/2022   Procedure: FLEXIBLE SIGMOIDOSCOPY;  Surgeon: Karie Soda, MD;  Location: [facility];  Service: General;  Laterality: N/A;   LEFT HEART CATHETERIZATION WITH CORONARY ANGIOGRAM N/A 10/25/2013   Procedure: LEFT HEART CATHETERIZATION WITH CORONARY ANGIOGRAM;  Surgeon: Lennette Bihari, MD;  Location: [facility] CATH LAB;  Service: Cardiovascular;  Laterality: N/A;   LYSIS OF ADHESION N/A 08/13/2022   Procedure: LYSIS OF ADHESION, DIVERTING ILEOSTOMY;  Surgeon: Karie Soda, MD;  Location: [facility];  Service: General;  Laterality: N/A;   NECK SURGERY     ACDF x 2      done in [place] more than 25 years, doesn't know levels   TOTAL KNEE ARTHROPLASTY     Bilateral x's 2   VAGINAL HYSTERECTOMY  10/17/1998   Konrad Dolores   VIDEO BRONCHOSCOPY Bilateral 01/24/2018   Procedure: VIDEO BRONCHOSCOPY WITH FLUORO;  Surgeon: Lupita Leash, MD;  Location: [facility] ENDOSCOPY;  Service: Cardiopulmonary;  Laterality: Bilateral;   XI ROBOTIC ASSISTED COLOSTOMY TAKEDOWN N/A 08/13/2022   Procedure: ROBOTIC OSTOMY TAKEDOWN, SMALL BOWEL RESECTION X 2, BILATERAL TAP BLOCK, TISSUE PERFUSSION ASSESMENT VIA FIREFLY, MOBILAZATION OF SPLENIC FLEXURE;  Surgeon: Karie Soda, MD;  Location[facility];  Service: General;  Laterality: N/A;   XI ROBOTIC ASSISTED LOWER ANTERIOR RESECTION N/A 03/31/2022   Procedure: XI ROBOTIC ASSISTED LOWER ANTERIOR RESECTION;  Surgeon: Karie Soda, MD;  Location: [facility];  Service: General;  Laterality: N/A;    MEDICATIONS:  amoxicillin-clavulanate (AUGMENTIN) 875-125 MG tablet   aspirin EC 81 MG tablet   benzonatate (TESSALON PERLES) 100 MG capsule   diphenoxylate-atropine (LOMOTIL) 2.5-0.025 MG tablet   ferrous sulfate 325 (65 FE) MG tablet   loperamide (IMODIUM) 2 MG capsule   methocarbamol (ROBAXIN) 500 MG tablet   Multiple Vitamin (MULTIVITAMIN WITH MINERALS)  TABS tablet   OFEV 100 MG CAPS   ondansetron (ZOFRAN) 4 MG tablet   potassium chloride SA (KLOR-CON M) 20 MEQ tablet   SYNTHROID 112 MCG tablet   traMADol (ULTRAM) 50 MG tablet   vitamin C (ASCORBIC ACID) 500 MG tablet   No current facility-administered medications for this encounter.   Marcille Blanco [facility] Short Stay/Anesthesiology [facility] Phone [phone] 11/13/2022 9:58 AM

## 2022-11-13 NOTE — Discharge Instructions (Signed)
See back next week prior to surgery to assess skin breakdown

## 2022-11-13 NOTE — Anesthesia Preprocedure Evaluation (Addendum)
Anesthesia Evaluation  Patient identified by MRN, date of birth, ID band Patient awake    Reviewed: Allergy & Precautions, NPO status , Patient's Chart, lab work & pertinent test results  Airway Mallampati: I       Dental no notable dental hx.    Pulmonary shortness of breath and with exertion, asthma , pneumonia, COPD,  COPD inhaler   Pulmonary exam normal breath sounds clear to auscultation       Cardiovascular hypertension, Pt. on medications + angina  + CAD, + Past MI and +CHF  Normal cardiovascular exam+ dysrhythmias Atrial Fibrillation + Valvular Problems/Murmurs  Rhythm:Regular  EKG 07/2022 NSR, PAC's, Incomplete RBBB pattern   Neuro/Psych  Headaches  Anxiety     TIA Neuromuscular disease    GI/Hepatic Neg liver ROS,,,Loop ileostomy in place   Endo/Other  Hypothyroidism    Renal/GU Renal InsufficiencyRenal disease  negative genitourinary   Musculoskeletal  (+) Arthritis ,    Abdominal   Peds  Hematology  (+) Blood dyscrasia, anemia   Anesthesia Other Findings   Reproductive/Obstetrics                              Anesthesia Physical Anesthesia Plan  ASA: 3  Anesthesia Plan: General   Post-op Pain Management: Tylenol PO (pre-op)* and Dilaudid IV   Induction: Intravenous  PONV Risk Score and Plan: 4 or greater and Treatment may vary due to age or medical condition, Ondansetron and Dexamethasone  Airway Management Planned: Oral ETT  Additional Equipment: None  Intra-op Plan:   Post-operative Plan: Extubation in OR  Informed Consent: I have reviewed the patients History and Physical, chart, labs and discussed the procedure including the risks, benefits and alternatives for the proposed anesthesia with the patient or authorized representative who has indicated his/her understanding and acceptance.     Dental advisory given  Plan Discussed with: CRNA and  Anesthesiologist  Anesthesia Plan Comments: (See PAT note from 8/7 by K Gekas PA-C. Prior anesthesia complications include prolonged emergence during colostomy takedown in May 2024. They would like to speak with the anesthesia team prior to surgery to make sure the have a less sedating protocol )         Anesthesia Quick Evaluation

## 2022-11-16 ENCOUNTER — Ambulatory Visit (HOSPITAL_BASED_OUTPATIENT_CLINIC_OR_DEPARTMENT_OTHER)
Admission: RE | Admit: 2022-11-16 | Discharge: 2022-11-16 | Disposition: A | Discharge status: Home / Self Care | Payer: Medicare Other | Source: Ambulatory Visit

## 2022-11-16 DIAGNOSIS — Z885 Allergy status to narcotic agent status: Secondary | ICD-10-CM | POA: Diagnosis not present

## 2022-11-16 DIAGNOSIS — Z432 Encounter for attention to ileostomy: Secondary | ICD-10-CM | POA: Diagnosis not present

## 2022-11-16 DIAGNOSIS — L259 Unspecified contact dermatitis, unspecified cause: Secondary | ICD-10-CM | POA: Insufficient documentation

## 2022-11-16 DIAGNOSIS — Z808 Family history of malignant neoplasm of other organs or systems: Secondary | ICD-10-CM | POA: Diagnosis not present

## 2022-11-16 DIAGNOSIS — I2510 Atherosclerotic heart disease of native coronary artery without angina pectoris: Secondary | ICD-10-CM | POA: Diagnosis present

## 2022-11-16 DIAGNOSIS — C20 Malignant neoplasm of rectum: Secondary | ICD-10-CM | POA: Diagnosis present

## 2022-11-16 DIAGNOSIS — Z8719 Personal history of other diseases of the digestive system: Secondary | ICD-10-CM | POA: Diagnosis not present

## 2022-11-16 DIAGNOSIS — J4489 Other specified chronic obstructive pulmonary disease: Secondary | ICD-10-CM | POA: Diagnosis present

## 2022-11-16 DIAGNOSIS — Z932 Ileostomy status: Secondary | ICD-10-CM | POA: Diagnosis not present

## 2022-11-16 DIAGNOSIS — Z8673 Personal history of transient ischemic attack (TIA), and cerebral infarction without residual deficits: Secondary | ICD-10-CM | POA: Diagnosis not present

## 2022-11-16 DIAGNOSIS — Z79899 Other long term (current) drug therapy: Secondary | ICD-10-CM | POA: Diagnosis not present

## 2022-11-16 DIAGNOSIS — J4520 Mild intermittent asthma, uncomplicated: Secondary | ICD-10-CM | POA: Diagnosis present

## 2022-11-16 DIAGNOSIS — E039 Hypothyroidism, unspecified: Secondary | ICD-10-CM | POA: Diagnosis not present

## 2022-11-16 DIAGNOSIS — Z79890 Hormone replacement therapy (postmenopausal): Secondary | ICD-10-CM | POA: Diagnosis not present

## 2022-11-16 DIAGNOSIS — K5289 Other specified noninfective gastroenteritis and colitis: Secondary | ICD-10-CM | POA: Diagnosis present

## 2022-11-16 DIAGNOSIS — Z96652 Presence of left artificial knee joint: Secondary | ICD-10-CM | POA: Diagnosis present

## 2022-11-16 DIAGNOSIS — K6389 Other specified diseases of intestine: Secondary | ICD-10-CM | POA: Diagnosis not present

## 2022-11-16 DIAGNOSIS — Z7982 Long term (current) use of aspirin: Secondary | ICD-10-CM | POA: Diagnosis not present

## 2022-11-16 DIAGNOSIS — I4891 Unspecified atrial fibrillation: Secondary | ICD-10-CM | POA: Diagnosis not present

## 2022-11-16 DIAGNOSIS — Z888 Allergy status to other drugs, medicaments and biological substances status: Secondary | ICD-10-CM | POA: Diagnosis not present

## 2022-11-16 DIAGNOSIS — J84112 Idiopathic pulmonary fibrosis: Secondary | ICD-10-CM | POA: Diagnosis present

## 2022-11-16 DIAGNOSIS — G43909 Migraine, unspecified, not intractable, without status migrainosus: Secondary | ICD-10-CM | POA: Diagnosis present

## 2022-11-16 DIAGNOSIS — I252 Old myocardial infarction: Secondary | ICD-10-CM | POA: Diagnosis not present

## 2022-11-16 DIAGNOSIS — Z8041 Family history of malignant neoplasm of ovary: Secondary | ICD-10-CM | POA: Diagnosis not present

## 2022-11-16 DIAGNOSIS — R195 Other fecal abnormalities: Secondary | ICD-10-CM | POA: Diagnosis not present

## 2022-11-16 DIAGNOSIS — I5032 Chronic diastolic (congestive) heart failure: Secondary | ICD-10-CM | POA: Diagnosis not present

## 2022-11-16 DIAGNOSIS — I110 Hypertensive heart disease with heart failure: Secondary | ICD-10-CM | POA: Diagnosis not present

## 2022-11-16 DIAGNOSIS — Z90710 Acquired absence of both cervix and uterus: Secondary | ICD-10-CM | POA: Diagnosis not present

## 2022-11-16 DIAGNOSIS — Z833 Family history of diabetes mellitus: Secondary | ICD-10-CM | POA: Diagnosis not present

## 2022-11-16 DIAGNOSIS — Z85828 Personal history of other malignant neoplasm of skin: Secondary | ICD-10-CM | POA: Diagnosis not present

## 2022-11-16 NOTE — Progress Notes (Signed)
[facility]   Reason for visit:  LLQ loop ileostomy in former colostomy site HPI:   Past Medical History:  Diagnosis Date  . Arthritis   . Bronchial pneumonia   . 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 Colon polyp   . Diverticulitis   . DVT (deep venous thrombosis) (HCC)    lower extremity  . H/O cardiac catheterization 2004   Normal coronary arteries  . Heart murmur   . History of stress test 05/21/2011  . Hx of echocardiogram 01/27/2010   Normal Ef 55% the transmitral spectral doppler flow pattern is normal for age. the left ventricular wall motion is normal  . Hypothyroidism   . IPF (idiopathic pulmonary fibrosis) (HCC) 01/2018  . Myocardial infarction (HCC)    hx of 30 years ago  . Pancreatitis   . TIA (transient ischemic attack)    8 years ago   Family History  Problem Relation Age of Onset  . Ovarian cancer Mother   . Uterine cancer Mother   . Lung cancer Father   . HIV Brother        48  . Kidney cancer Brother   . Lung cancer Brother   . Other Brother        Mouth Cancer  . Colon cancer Maternal Aunt   . Heart disease Maternal Grandmother   . Stroke Maternal Grandmother   . Hypertension Maternal Grandmother   . Diabetes Maternal Grandmother   . Arthritis Other   . Esophageal cancer Neg Hx   . Liver disease Neg Hx   . Rectal cancer Neg Hx   . Stomach cancer Neg Hx    Allergies  Allergen Reactions  . Prednisone Other (See Comments)    Changed personality   . Dilaudid [Hydromorphone] Itching and Other (See Comments)    Dilaudid caused marked confusion  . Atrovent Hfa [Ipratropium Bromide Hfa] Itching and Other (See Comments)    Pt could not sleep, Insomnia   . Cheratussin Ac [Guaifenesin-Codeine] Other (See Comments)    Headaches   . Diltiazem Itching and Other (See Comments)    Makes patient sick. Shakes. GI  . Hydrocodone Itching  . Influenza Vaccines Other (See Comments)    Pt reports heart attack after last flu shot  . Morphine  Itching  . Motrin [Ibuprofen] Itching and Other (See Comments)    "Gives false reading in blood"  . Oxycodone Itching  . Codeine Itching, Nausea Only and Other (See Comments)    Pt takes promethazine with codeine at home   Current Outpatient Medications  Medication Sig Dispense Refill Last Dose  . amoxicillin-clavulanate (AUGMENTIN) 875-125 MG tablet Take 1 tablet by mouth 2 (two) times daily. (Patient not taking: Reported on 11/04/2022) 20 tablet 0   . aspirin EC 81 MG tablet Take 81 mg by mouth 3 (three) times a week. Swallow whole.     . benzonatate (TESSALON PERLES) 100 MG capsule Take 1 capsule (100 mg total) by mouth 3 (three) times daily as needed for cough. (Patient not taking: Reported on 11/04/2022) 20 capsule 0   . diphenoxylate-atropine (LOMOTIL) 2.5-0.025 MG tablet Take 1 tablet by mouth 4 (four) times daily.     . ferrous sulfate 325 (65 FE) MG tablet Take 1 tablet (325 mg total) by mouth 2 (two) times daily with a meal. 60 tablet 2   . loperamide (IMODIUM) 2 MG capsule Take 2 capsules (4 mg total) by mouth every 6 (six) hours as needed for diarrhea  or loose stools (Use if >361mL ileostomy output every 6 hours). (Patient not taking: Reported on 11/04/2022) 90 capsule 3   . methocarbamol (ROBAXIN) 500 MG tablet Take 1 tablet (500 mg total) by mouth every 6 (six) hours as needed for muscle spasms. (Patient not taking: Reported on 11/04/2022) 20 tablet 2   . Multiple Vitamin (MULTIVITAMIN WITH MINERALS) TABS tablet Take 1 tablet by mouth once a week.     Marland Kitchen OFEV 100 MG CAPS Take 100 mg by mouth 2 (two) times daily.     . ondansetron (ZOFRAN) 4 MG tablet TAKE 1 TABLET BY MOUTH EVERY 8 HOURS AS NEEDED FOR NAUSEA AND VOMITING 20 tablet 0   . potassium chloride SA (KLOR-CON M) 20 MEQ tablet Take 20 mEq by mouth daily.     Marland Kitchen SYNTHROID 112 MCG tablet TAKE 1 TABLET BY MOUTH EVERY DAY BEFORE BREAKFAST 90 tablet 0   . traMADol (ULTRAM) 50 MG tablet Take 1-2 tablets (50-100 mg total) by mouth every  6 (six) hours as needed for moderate pain. (Patient not taking: Reported on 11/04/2022) 20 tablet 0   . vitamin C (ASCORBIC ACID) 500 MG tablet Take 500 mg by mouth 2 (two) times a week.      No current facility-administered medications for this encounter.   ROS  Review of Systems  Gastrointestinal:        LLQ ileostomy  Skin:  Positive for color change.       Erythema around stoma improved.  Nonintact dermatitis resolved. Occasional itching around stoma  All other systems reviewed and are negative. Vital signs:  BP (!) 182/91   Pulse 90   Temp 97.9 F (36.6 C) (Oral)   Resp 18   SpO2 92%  Exam:  Physical Exam Vitals reviewed.  Constitutional:      Appearance: Normal appearance.  Abdominal:     Palpations: Abdomen is soft.  Skin:    General: Skin is warm and dry.     Findings: Erythema present.  Neurological:     Mental Status: She is alert and oriented to person, place, and time.  Psychiatric:        Mood and Affect: Mood normal.        Behavior: Behavior normal.    Stoma type/location:  LLQ loop ileostomy Stomal assessment/size:  2 cm x 1.5 cm  Peristomal assessment:  erythema, intact  improved since last visit.  Still has itching at times Treatment options for stomal/peristomal skin: skin prep, barrier ring Output: soft brown stool Ostomy pouching: 2pc. 2 1/4" pouch with barrier ring  Education provided:  Reversal scheduled for Thursday.  She is anxious but ready.  Daughter in town to  help with recovery    Impression/dx  Ileostomy Contact dermatitis Discussion  Upcoming surgery Plan  See back if needed.      Visit time: 35 minutes.   Maple Hudson FNP-BC

## 2022-11-18 ENCOUNTER — Encounter (HOSPITAL_COMMUNITY): Payer: Self-pay | Admitting: Surgery

## 2022-11-18 DIAGNOSIS — Z432 Encounter for attention to ileostomy: Secondary | ICD-10-CM | POA: Insufficient documentation

## 2022-11-19 ENCOUNTER — Inpatient Hospital Stay (HOSPITAL_COMMUNITY): Payer: Medicare Other | Admitting: Anesthesiology

## 2022-11-19 ENCOUNTER — Encounter (HOSPITAL_COMMUNITY): Payer: Self-pay | Admitting: Surgery

## 2022-11-19 ENCOUNTER — Inpatient Hospital Stay (HOSPITAL_COMMUNITY): Payer: Medicare Other | Admitting: Medical

## 2022-11-19 ENCOUNTER — Other Ambulatory Visit: Payer: Self-pay

## 2022-11-19 ENCOUNTER — Encounter (HOSPITAL_COMMUNITY)
Admission: RE | Disposition: A | Discharge status: Home / Self Care | Payer: Self-pay | Source: Home / Self Care | Attending: Surgery

## 2022-11-19 ENCOUNTER — Inpatient Hospital Stay (HOSPITAL_COMMUNITY)
Admission: RE | Admit: 2022-11-19 | Discharge: 2022-11-23 | DRG: 330 | Disposition: A | Discharge status: Home / Self Care | Payer: Medicare Other | Attending: Surgery | Admitting: Surgery

## 2022-11-19 DIAGNOSIS — I87009 Postthrombotic syndrome without complications of unspecified extremity: Secondary | ICD-10-CM | POA: Insufficient documentation

## 2022-11-19 DIAGNOSIS — Z932 Ileostomy status: Secondary | ICD-10-CM

## 2022-11-19 DIAGNOSIS — G43909 Migraine, unspecified, not intractable, without status migrainosus: Secondary | ICD-10-CM | POA: Diagnosis present

## 2022-11-19 DIAGNOSIS — Z801 Family history of malignant neoplasm of trachea, bronchus and lung: Secondary | ICD-10-CM

## 2022-11-19 DIAGNOSIS — Z432 Encounter for attention to ileostomy: Principal | ICD-10-CM

## 2022-11-19 DIAGNOSIS — Z8049 Family history of malignant neoplasm of other genital organs: Secondary | ICD-10-CM

## 2022-11-19 DIAGNOSIS — I4891 Unspecified atrial fibrillation: Secondary | ICD-10-CM | POA: Diagnosis not present

## 2022-11-19 DIAGNOSIS — E039 Hypothyroidism, unspecified: Secondary | ICD-10-CM | POA: Diagnosis present

## 2022-11-19 DIAGNOSIS — Z885 Allergy status to narcotic agent status: Secondary | ICD-10-CM

## 2022-11-19 DIAGNOSIS — C20 Malignant neoplasm of rectum: Secondary | ICD-10-CM | POA: Diagnosis present

## 2022-11-19 DIAGNOSIS — K3189 Other diseases of stomach and duodenum: Secondary | ICD-10-CM | POA: Insufficient documentation

## 2022-11-19 DIAGNOSIS — Z8673 Personal history of transient ischemic attack (TIA), and cerebral infarction without residual deficits: Secondary | ICD-10-CM | POA: Diagnosis not present

## 2022-11-19 DIAGNOSIS — I110 Hypertensive heart disease with heart failure: Secondary | ICD-10-CM | POA: Diagnosis present

## 2022-11-19 DIAGNOSIS — I5032 Chronic diastolic (congestive) heart failure: Secondary | ICD-10-CM

## 2022-11-19 DIAGNOSIS — K589 Irritable bowel syndrome without diarrhea: Secondary | ICD-10-CM | POA: Insufficient documentation

## 2022-11-19 DIAGNOSIS — R195 Other fecal abnormalities: Secondary | ICD-10-CM | POA: Diagnosis not present

## 2022-11-19 DIAGNOSIS — Z85828 Personal history of other malignant neoplasm of skin: Secondary | ICD-10-CM

## 2022-11-19 DIAGNOSIS — Z823 Family history of stroke: Secondary | ICD-10-CM

## 2022-11-19 DIAGNOSIS — Z8719 Personal history of other diseases of the digestive system: Secondary | ICD-10-CM | POA: Diagnosis not present

## 2022-11-19 DIAGNOSIS — J4520 Mild intermittent asthma, uncomplicated: Secondary | ICD-10-CM | POA: Diagnosis present

## 2022-11-19 DIAGNOSIS — Z79890 Hormone replacement therapy (postmenopausal): Secondary | ICD-10-CM | POA: Diagnosis not present

## 2022-11-19 DIAGNOSIS — I2510 Atherosclerotic heart disease of native coronary artery without angina pectoris: Secondary | ICD-10-CM | POA: Diagnosis present

## 2022-11-19 DIAGNOSIS — Z90710 Acquired absence of both cervix and uterus: Secondary | ICD-10-CM | POA: Diagnosis not present

## 2022-11-19 DIAGNOSIS — J849 Interstitial pulmonary disease, unspecified: Secondary | ICD-10-CM | POA: Diagnosis present

## 2022-11-19 DIAGNOSIS — G44219 Episodic tension-type headache, not intractable: Secondary | ICD-10-CM | POA: Insufficient documentation

## 2022-11-19 DIAGNOSIS — K6389 Other specified diseases of intestine: Secondary | ICD-10-CM | POA: Diagnosis not present

## 2022-11-19 DIAGNOSIS — J4489 Other specified chronic obstructive pulmonary disease: Secondary | ICD-10-CM | POA: Diagnosis present

## 2022-11-19 DIAGNOSIS — Z8249 Family history of ischemic heart disease and other diseases of the circulatory system: Secondary | ICD-10-CM

## 2022-11-19 DIAGNOSIS — Z888 Allergy status to other drugs, medicaments and biological substances status: Secondary | ICD-10-CM | POA: Diagnosis not present

## 2022-11-19 DIAGNOSIS — J84112 Idiopathic pulmonary fibrosis: Secondary | ICD-10-CM | POA: Diagnosis present

## 2022-11-19 DIAGNOSIS — Z808 Family history of malignant neoplasm of other organs or systems: Secondary | ICD-10-CM | POA: Diagnosis not present

## 2022-11-19 DIAGNOSIS — I252 Old myocardial infarction: Secondary | ICD-10-CM | POA: Diagnosis not present

## 2022-11-19 DIAGNOSIS — Z96652 Presence of left artificial knee joint: Secondary | ICD-10-CM | POA: Diagnosis present

## 2022-11-19 DIAGNOSIS — Z833 Family history of diabetes mellitus: Secondary | ICD-10-CM

## 2022-11-19 DIAGNOSIS — K5720 Diverticulitis of large intestine with perforation and abscess without bleeding: Principal | ICD-10-CM | POA: Diagnosis present

## 2022-11-19 DIAGNOSIS — Z800 Family history of malignant neoplasm of digestive organs: Secondary | ICD-10-CM

## 2022-11-19 DIAGNOSIS — Z79899 Other long term (current) drug therapy: Secondary | ICD-10-CM | POA: Diagnosis not present

## 2022-11-19 DIAGNOSIS — Z7982 Long term (current) use of aspirin: Secondary | ICD-10-CM

## 2022-11-19 DIAGNOSIS — I10 Essential (primary) hypertension: Secondary | ICD-10-CM | POA: Diagnosis present

## 2022-11-19 DIAGNOSIS — K5289 Other specified noninfective gastroenteritis and colitis: Secondary | ICD-10-CM | POA: Diagnosis present

## 2022-11-19 DIAGNOSIS — Z8041 Family history of malignant neoplasm of ovary: Secondary | ICD-10-CM | POA: Diagnosis not present

## 2022-11-19 DIAGNOSIS — Z8051 Family history of malignant neoplasm of kidney: Secondary | ICD-10-CM

## 2022-11-19 DIAGNOSIS — Z560 Unemployment, unspecified: Secondary | ICD-10-CM

## 2022-11-19 HISTORY — PX: ILEOSTOMY CLOSURE: SHX1784

## 2022-11-19 SURGERY — CLOSURE, ILEOSTOMY
Anesthesia: General

## 2022-11-19 MED ORDER — ONDANSETRON HCL 4 MG/2ML IJ SOLN
4.0000 mg | Freq: Four times a day (QID) | INTRAMUSCULAR | Status: DC | PRN
  Administered 2022-11-19 – 2022-11-20 (×2): 4 mg via INTRAVENOUS
  Filled 2022-11-19 (×2): qty 2

## 2022-11-19 MED ORDER — DIPHENHYDRAMINE HCL 50 MG/ML IJ SOLN: 12.5000 mg | Freq: Four times a day (QID) | INTRAMUSCULAR | Status: DC | PRN

## 2022-11-19 MED ORDER — PROPOFOL 1000 MG/100ML IV EMUL
INTRAVENOUS | Status: AC
  Filled 2022-11-19: qty 100

## 2022-11-19 MED ORDER — METHOCARBAMOL 500 MG PO TABS: 500.0000 mg | ORAL_TABLET | Freq: Four times a day (QID) | ORAL | Status: DC | PRN

## 2022-11-19 MED ORDER — LIDOCAINE 2% (20 MG/ML) 5 ML SYRINGE
INTRAMUSCULAR | Status: DC | PRN
  Administered 2022-11-19: 80 mg via INTRAVENOUS

## 2022-11-19 MED ORDER — CHLORHEXIDINE GLUCONATE CLOTH 2 % EX PADS: 6.0000 | MEDICATED_PAD | Freq: Once | CUTANEOUS | Status: DC

## 2022-11-19 MED ORDER — FENTANYL CITRATE PF 50 MCG/ML IJ SOSY
PREFILLED_SYRINGE | INTRAMUSCULAR | Status: AC
  Filled 2022-11-19: qty 1

## 2022-11-19 MED ORDER — DEXAMETHASONE SODIUM PHOSPHATE 10 MG/ML IJ SOLN
INTRAMUSCULAR | Status: AC
  Filled 2022-11-19: qty 2

## 2022-11-19 MED ORDER — ACETAMINOPHEN 500 MG PO TABS
1000.0000 mg | ORAL_TABLET | ORAL | Status: AC
  Administered 2022-11-19: 1000 mg via ORAL
  Filled 2022-11-19: qty 2

## 2022-11-19 MED ORDER — POTASSIUM CHLORIDE 2 MEQ/ML IV SOLN
INTRAVENOUS | Status: AC
  Filled 2022-11-19 (×2): qty 1000

## 2022-11-19 MED ORDER — NAPHAZOLINE-GLYCERIN 0.012-0.25 % OP SOLN: 1.0000 [drp] | Freq: Four times a day (QID) | OPHTHALMIC | Status: DC | PRN

## 2022-11-19 MED ORDER — ROCURONIUM BROMIDE 10 MG/ML (PF) SYRINGE
PREFILLED_SYRINGE | INTRAVENOUS | Status: AC
  Filled 2022-11-19: qty 20

## 2022-11-19 MED ORDER — PROPOFOL 10 MG/ML IV BOLUS
INTRAVENOUS | Status: AC
  Filled 2022-11-19: qty 20

## 2022-11-19 MED ORDER — NAPROXEN 250 MG PO TABS
500.0000 mg | ORAL_TABLET | Freq: Two times a day (BID) | ORAL | Status: DC
  Administered 2022-11-20 – 2022-11-23 (×7): 500 mg via ORAL
  Filled 2022-11-19 (×9): qty 2

## 2022-11-19 MED ORDER — ROCURONIUM BROMIDE 10 MG/ML (PF) SYRINGE
PREFILLED_SYRINGE | INTRAVENOUS | Status: DC | PRN
  Administered 2022-11-19: 70 mg via INTRAVENOUS

## 2022-11-19 MED ORDER — ONDANSETRON HCL 4 MG/2ML IJ SOLN
INTRAMUSCULAR | Status: AC
  Filled 2022-11-19: qty 2

## 2022-11-19 MED ORDER — HYDRALAZINE HCL 20 MG/ML IJ SOLN
INTRAMUSCULAR | Status: AC
  Filled 2022-11-19: qty 1

## 2022-11-19 MED ORDER — SODIUM CHLORIDE 0.9 % IV SOLN
2.0000 g | Freq: Two times a day (BID) | INTRAVENOUS | Status: AC
  Administered 2022-11-19: 2 g via INTRAVENOUS
  Filled 2022-11-19: qty 2

## 2022-11-19 MED ORDER — ENOXAPARIN SODIUM 40 MG/0.4ML IJ SOSY
40.0000 mg | PREFILLED_SYRINGE | Freq: Once | INTRAMUSCULAR | Status: AC
  Administered 2022-11-19: 40 mg via SUBCUTANEOUS
  Filled 2022-11-19: qty 0.4

## 2022-11-19 MED ORDER — HYDRALAZINE HCL 20 MG/ML IJ SOLN
10.0000 mg | INTRAMUSCULAR | Status: DC | PRN
  Administered 2022-11-23: 10 mg via INTRAVENOUS
  Filled 2022-11-19: qty 1

## 2022-11-19 MED ORDER — SIMETHICONE 80 MG PO CHEW
40.0000 mg | CHEWABLE_TABLET | Freq: Four times a day (QID) | ORAL | Status: DC | PRN
  Administered 2022-11-20 – 2022-11-21 (×2): 40 mg via ORAL
  Filled 2022-11-19 (×3): qty 1

## 2022-11-19 MED ORDER — CHLORHEXIDINE GLUCONATE 0.12 % MT SOLN
15.0000 mL | Freq: Once | OROMUCOSAL | Status: AC
  Administered 2022-11-19: 15 mL via OROMUCOSAL

## 2022-11-19 MED ORDER — BUPIVACAINE LIPOSOME 1.3 % IJ SUSP
INTRAMUSCULAR | Status: AC
  Filled 2022-11-19: qty 20

## 2022-11-19 MED ORDER — PHENOL 1.4 % MT LIQD: 2.0000 | OROMUCOSAL | Status: DC | PRN

## 2022-11-19 MED ORDER — ENSURE PRE-SURGERY PO LIQD: 592.0000 mL | Freq: Once | ORAL | Status: DC

## 2022-11-19 MED ORDER — ENOXAPARIN SODIUM 40 MG/0.4ML IJ SOSY
40.0000 mg | PREFILLED_SYRINGE | INTRAMUSCULAR | Status: DC
  Administered 2022-11-20 – 2022-11-23 (×4): 40 mg via SUBCUTANEOUS
  Filled 2022-11-19 (×4): qty 0.4

## 2022-11-19 MED ORDER — DEXMEDETOMIDINE HCL IN NACL 80 MCG/20ML IV SOLN
INTRAVENOUS | Status: DC | PRN
  Administered 2022-11-19 (×2): 4 ug via INTRAVENOUS

## 2022-11-19 MED ORDER — BUPIVACAINE-EPINEPHRINE (PF) 0.25% -1:200000 IJ SOLN
INTRAMUSCULAR | Status: DC | PRN
  Administered 2022-11-19: 70 mL

## 2022-11-19 MED ORDER — SODIUM CHLORIDE 0.9 % IV SOLN
2.0000 g | INTRAVENOUS | Status: AC
  Administered 2022-11-19: 2 g via INTRAVENOUS
  Filled 2022-11-19: qty 2

## 2022-11-19 MED ORDER — POTASSIUM CHLORIDE CRYS ER 20 MEQ PO TBCR
20.0000 meq | EXTENDED_RELEASE_TABLET | Freq: Every day | ORAL | Status: DC
  Administered 2022-11-20 – 2022-11-23 (×4): 20 meq via ORAL
  Filled 2022-11-19 (×4): qty 1

## 2022-11-19 MED ORDER — MAGIC MOUTHWASH: 15.0000 mL | Freq: Four times a day (QID) | ORAL | Status: DC | PRN

## 2022-11-19 MED ORDER — DIPHENHYDRAMINE HCL 12.5 MG/5ML PO ELIX: 12.5000 mg | ORAL_SOLUTION | Freq: Four times a day (QID) | ORAL | Status: DC | PRN

## 2022-11-19 MED ORDER — ONDANSETRON HCL 4 MG PO TABS
4.0000 mg | ORAL_TABLET | Freq: Four times a day (QID) | ORAL | Status: DC | PRN
  Administered 2022-11-21 (×2): 4 mg via ORAL
  Filled 2022-11-19 (×2): qty 1

## 2022-11-19 MED ORDER — ONDANSETRON HCL 4 MG/2ML IJ SOLN
INTRAMUSCULAR | Status: AC
  Filled 2022-11-19: qty 4

## 2022-11-19 MED ORDER — TRAMADOL HCL 50 MG PO TABS
50.0000 mg | ORAL_TABLET | Freq: Four times a day (QID) | ORAL | Status: DC | PRN
  Administered 2022-11-19: 50 mg via ORAL
  Administered 2022-11-19 – 2022-11-23 (×12): 100 mg via ORAL
  Filled 2022-11-19 (×11): qty 2
  Filled 2022-11-19: qty 1
  Filled 2022-11-19 (×2): qty 2

## 2022-11-19 MED ORDER — SODIUM CHLORIDE 0.9 % IV SOLN: 250.0000 mL | INTRAVENOUS | Status: DC | PRN

## 2022-11-19 MED ORDER — ACETAMINOPHEN 500 MG PO TABS
1000.0000 mg | ORAL_TABLET | Freq: Four times a day (QID) | ORAL | Status: DC
  Administered 2022-11-19 – 2022-11-23 (×15): 1000 mg via ORAL
  Filled 2022-11-19 (×16): qty 2

## 2022-11-19 MED ORDER — SUGAMMADEX SODIUM 200 MG/2ML IV SOLN
INTRAVENOUS | Status: DC | PRN
  Administered 2022-11-19: 200 mg via INTRAVENOUS

## 2022-11-19 MED ORDER — PROCHLORPERAZINE EDISYLATE 10 MG/2ML IJ SOLN: 5.0000 mg | Freq: Four times a day (QID) | INTRAMUSCULAR | Status: DC | PRN

## 2022-11-19 MED ORDER — MELATONIN 3 MG PO TABS: 3.0000 mg | ORAL_TABLET | Freq: Every evening | ORAL | Status: DC | PRN

## 2022-11-19 MED ORDER — ADULT MULTIVITAMIN W/MINERALS CH
1.0000 | ORAL_TABLET | ORAL | Status: DC
  Administered 2022-11-20: 1 via ORAL
  Filled 2022-11-19: qty 1

## 2022-11-19 MED ORDER — PROCHLORPERAZINE MALEATE 10 MG PO TABS: 10.0000 mg | ORAL_TABLET | Freq: Four times a day (QID) | ORAL | Status: DC | PRN

## 2022-11-19 MED ORDER — IBUPROFEN 200 MG PO TABS: 800.0000 mg | ORAL_TABLET | Freq: Three times a day (TID) | ORAL | Status: DC

## 2022-11-19 MED ORDER — ORAL CARE MOUTH RINSE: 15.0000 mL | Freq: Once | OROMUCOSAL | Status: AC

## 2022-11-19 MED ORDER — SALINE SPRAY 0.65 % NA SOLN: 1.0000 | Freq: Four times a day (QID) | NASAL | Status: DC | PRN

## 2022-11-19 MED ORDER — HYDRALAZINE HCL 20 MG/ML IJ SOLN
5.0000 mg | Freq: Once | INTRAMUSCULAR | Status: AC
  Administered 2022-11-19: 5 mg via INTRAVENOUS

## 2022-11-19 MED ORDER — ENSURE SURGERY PO LIQD: 237.0000 mL | Freq: Two times a day (BID) | ORAL | Status: DC

## 2022-11-19 MED ORDER — LIDOCAINE HCL (PF) 2 % IJ SOLN
INTRAMUSCULAR | Status: AC
  Filled 2022-11-19: qty 10

## 2022-11-19 MED ORDER — FENTANYL CITRATE PF 50 MCG/ML IJ SOSY
12.5000 ug | PREFILLED_SYRINGE | INTRAMUSCULAR | Status: DC | PRN
  Administered 2022-11-19: 25 ug via INTRAVENOUS
  Filled 2022-11-19: qty 1

## 2022-11-19 MED ORDER — BUPIVACAINE-EPINEPHRINE 0.25% -1:200000 IJ SOLN
INTRAMUSCULAR | Status: AC
  Filled 2022-11-19: qty 1

## 2022-11-19 MED ORDER — LORAZEPAM 2 MG/ML IJ SOLN
0.5000 mg | Freq: Three times a day (TID) | INTRAMUSCULAR | Status: DC | PRN
  Administered 2022-11-19: 1 mg via INTRAVENOUS
  Filled 2022-11-19: qty 1

## 2022-11-19 MED ORDER — BENZONATATE 100 MG PO CAPS: 100.0000 mg | ORAL_CAPSULE | Freq: Three times a day (TID) | ORAL | Status: DC | PRN

## 2022-11-19 MED ORDER — LACTATED RINGERS IV SOLN: Freq: Three times a day (TID) | INTRAVENOUS | Status: AC | PRN

## 2022-11-19 MED ORDER — ONDANSETRON HCL 4 MG/2ML IJ SOLN
INTRAMUSCULAR | Status: DC | PRN
Start: 2022-11-19 — End: 2022-11-19
  Administered 2022-11-19: 4 mg via INTRAVENOUS

## 2022-11-19 MED ORDER — MENTHOL 3 MG MT LOZG: 1.0000 | LOZENGE | OROMUCOSAL | Status: DC | PRN

## 2022-11-19 MED ORDER — 0.9 % SODIUM CHLORIDE (POUR BTL) OPTIME
TOPICAL | Status: DC | PRN
Start: 2022-11-19 — End: 2022-11-19
  Administered 2022-11-19: 1000 mL

## 2022-11-19 MED ORDER — FENTANYL CITRATE (PF) 100 MCG/2ML IJ SOLN
INTRAMUSCULAR | Status: DC | PRN
  Administered 2022-11-19: 25 ug via INTRAVENOUS
  Administered 2022-11-19: 75 ug via INTRAVENOUS

## 2022-11-19 MED ORDER — LACTATED RINGERS IV SOLN: INTRAVENOUS | Status: DC

## 2022-11-19 MED ORDER — DROPERIDOL 2.5 MG/ML IJ SOLN: 0.6250 mg | Freq: Once | INTRAMUSCULAR | Status: DC | PRN

## 2022-11-19 MED ORDER — ASPIRIN 81 MG PO TBEC
81.0000 mg | DELAYED_RELEASE_TABLET | ORAL | Status: DC
  Administered 2022-11-20 – 2022-11-23 (×2): 81 mg via ORAL
  Filled 2022-11-19 (×2): qty 1

## 2022-11-19 MED ORDER — HALOPERIDOL LACTATE 5 MG/ML IJ SOLN: 2.0000 mg | Freq: Four times a day (QID) | INTRAMUSCULAR | Status: DC | PRN

## 2022-11-19 MED ORDER — LEVOTHYROXINE SODIUM 112 MCG PO TABS
112.0000 ug | ORAL_TABLET | Freq: Every day | ORAL | Status: DC
  Administered 2022-11-20 – 2022-11-23 (×4): 112 ug via ORAL
  Filled 2022-11-19 (×4): qty 1

## 2022-11-19 MED ORDER — ALVIMOPAN 12 MG PO CAPS
12.0000 mg | ORAL_CAPSULE | Freq: Two times a day (BID) | ORAL | Status: DC
  Administered 2022-11-20 (×2): 12 mg via ORAL
  Filled 2022-11-19 (×2): qty 1

## 2022-11-19 MED ORDER — FENTANYL CITRATE PF 50 MCG/ML IJ SOSY
50.0000 ug | PREFILLED_SYRINGE | INTRAMUSCULAR | Status: DC | PRN
  Administered 2022-11-19 – 2022-11-21 (×3): 50 ug via INTRAVENOUS
  Filled 2022-11-19 (×3): qty 1

## 2022-11-19 MED ORDER — MIDAZOLAM HCL 2 MG/2ML IJ SOLN
INTRAMUSCULAR | Status: AC
  Filled 2022-11-19: qty 2

## 2022-11-19 MED ORDER — PROPOFOL 10 MG/ML IV BOLUS
INTRAVENOUS | Status: DC | PRN
  Administered 2022-11-19: 120 mg via INTRAVENOUS

## 2022-11-19 MED ORDER — SODIUM CHLORIDE 0.9% FLUSH
3.0000 mL | Freq: Two times a day (BID) | INTRAVENOUS | Status: DC
  Administered 2022-11-20 – 2022-11-23 (×7): 3 mL via INTRAVENOUS

## 2022-11-19 MED ORDER — ALUM & MAG HYDROXIDE-SIMETH 200-200-20 MG/5ML PO SUSP: 30.0000 mL | Freq: Four times a day (QID) | ORAL | Status: DC | PRN

## 2022-11-19 MED ORDER — SODIUM CHLORIDE 0.9% FLUSH: 3.0000 mL | INTRAVENOUS | Status: DC | PRN

## 2022-11-19 MED ORDER — BUPIVACAINE LIPOSOME 1.3 % IJ SUSP: 20.0000 mL | Freq: Once | INTRAMUSCULAR | Status: DC

## 2022-11-19 MED ORDER — LORAZEPAM 0.5 MG PO TABS: 0.5000 mg | ORAL_TABLET | Freq: Three times a day (TID) | ORAL | Status: DC | PRN

## 2022-11-19 MED ORDER — ALVIMOPAN 12 MG PO CAPS
12.0000 mg | ORAL_CAPSULE | ORAL | Status: AC
  Administered 2022-11-19: 12 mg via ORAL
  Filled 2022-11-19: qty 1

## 2022-11-19 MED ORDER — VITAMIN C 500 MG PO TABS
500.0000 mg | ORAL_TABLET | ORAL | Status: DC
  Administered 2022-11-23: 500 mg via ORAL
  Filled 2022-11-19: qty 1

## 2022-11-19 MED ORDER — DEXMEDETOMIDINE HCL IN NACL 80 MCG/20ML IV SOLN
INTRAVENOUS | Status: AC
  Filled 2022-11-19: qty 20

## 2022-11-19 MED ORDER — METOPROLOL TARTRATE 5 MG/5ML IV SOLN: 5.0000 mg | Freq: Four times a day (QID) | INTRAVENOUS | Status: DC | PRN

## 2022-11-19 MED ORDER — ONDANSETRON HCL 4 MG/2ML IJ SOLN
4.0000 mg | Freq: Once | INTRAMUSCULAR | Status: AC | PRN
  Administered 2022-11-19: 4 mg via INTRAVENOUS

## 2022-11-19 MED ORDER — ENSURE PRE-SURGERY PO LIQD: 296.0000 mL | Freq: Once | ORAL | Status: DC

## 2022-11-19 MED ORDER — FENTANYL CITRATE (PF) 100 MCG/2ML IJ SOLN
INTRAMUSCULAR | Status: AC
  Filled 2022-11-19: qty 2

## 2022-11-19 MED ORDER — FENTANYL CITRATE PF 50 MCG/ML IJ SOSY
25.0000 ug | PREFILLED_SYRINGE | INTRAMUSCULAR | Status: DC | PRN
  Administered 2022-11-19: 25 ug via INTRAVENOUS
  Administered 2022-11-19: 50 ug via INTRAVENOUS
  Administered 2022-11-19 (×3): 25 ug via INTRAVENOUS

## 2022-11-19 SURGICAL SUPPLY — 73 items
APL PRP STRL LF DISP 70% ISPRP (MISCELLANEOUS) ×1
APL SKNCLS STERI-STRIP NONHPOA (GAUZE/BANDAGES/DRESSINGS) ×1
BAG COUNTER SPONGE SURGICOUNT (BAG) IMPLANT
BAG SPNG CNTER NS LX DISP (BAG)
BENZOIN TINCTURE PRP APPL 2/3 (GAUZE/BANDAGES/DRESSINGS) ×2 IMPLANT
BLADE SURG 15 STRL LF DISP TIS (BLADE) IMPLANT
BLADE SURG 15 STRL SS (BLADE)
BLADE SURG SZ10 CARB STEEL (BLADE) ×2 IMPLANT
BRIEF MESH DISP LRG (UNDERPADS AND DIAPERS) ×2 IMPLANT
CELLS DAT CNTRL 66122 CELL SVR (MISCELLANEOUS) ×1 IMPLANT
CHLORAPREP W/TINT 26 (MISCELLANEOUS) ×2 IMPLANT
CNTNR URN SCR LID CUP LEK RST (MISCELLANEOUS) ×2 IMPLANT
CONT SPEC 4OZ STRL OR WHT (MISCELLANEOUS) ×1
COVER MAYO STAND STRL (DRAPES) ×2 IMPLANT
COVER SURGICAL LIGHT HANDLE (MISCELLANEOUS) ×2 IMPLANT
DRAIN CHANNEL 19F RND (DRAIN) IMPLANT
DRAPE LAPAROSCOPIC ABDOMINAL (DRAPES) ×2 IMPLANT
DRAPE LAPAROTOMY T 102X78X121 (DRAPES) ×2 IMPLANT
DRAPE SHEET LG 3/4 BI-LAMINATE (DRAPES) IMPLANT
DRAPE UTILITY XL STRL (DRAPES) ×2 IMPLANT
DRAPE WARM FLUID 44X44 (DRAPES) ×2 IMPLANT
DRSG OPSITE POSTOP 4X10 (GAUZE/BANDAGES/DRESSINGS) IMPLANT
DRSG OPSITE POSTOP 4X6 (GAUZE/BANDAGES/DRESSINGS) IMPLANT
DRSG OPSITE POSTOP 4X8 (GAUZE/BANDAGES/DRESSINGS) IMPLANT
DRSG TEGADERM 2-3/8X2-3/4 SM (GAUZE/BANDAGES/DRESSINGS) ×2 IMPLANT
DRSG TEGADERM 4X4.75 (GAUZE/BANDAGES/DRESSINGS) ×2 IMPLANT
ELECT REM PT RETURN 15FT ADLT (MISCELLANEOUS) ×2 IMPLANT
GAUZE 4X4 16PLY ~~LOC~~+RFID DBL (SPONGE) ×2 IMPLANT
GAUZE PAD ABD 8X10 STRL (GAUZE/BANDAGES/DRESSINGS) IMPLANT
GAUZE SPONGE 4X4 12PLY STRL (GAUZE/BANDAGES/DRESSINGS) ×2 IMPLANT
GLOVE ECLIPSE 8.0 STRL XLNG CF (GLOVE) ×2 IMPLANT
GLOVE INDICATOR 8.0 STRL GRN (GLOVE) ×2 IMPLANT
GOWN STRL REUS W/ TWL XL LVL3 (GOWN DISPOSABLE) ×8 IMPLANT
GOWN STRL REUS W/TWL XL LVL3 (GOWN DISPOSABLE) ×4
HANDLE SUCTION POOLE (INSTRUMENTS) ×2 IMPLANT
KIT BASIN OR (CUSTOM PROCEDURE TRAY) ×2 IMPLANT
KIT TURNOVER KIT A (KITS) IMPLANT
LEGGING LITHOTOMY PAIR STRL (DRAPES) ×2 IMPLANT
LOOP VESSEL MAXI BLUE (MISCELLANEOUS) IMPLANT
NDL HYPO 22X1.5 SAFETY MO (MISCELLANEOUS) ×2 IMPLANT
NEEDLE HYPO 22X1.5 SAFETY MO (MISCELLANEOUS) ×1 IMPLANT
PACK BASIC VI WITH GOWN DISP (CUSTOM PROCEDURE TRAY) ×2 IMPLANT
PACK GENERAL/GYN (CUSTOM PROCEDURE TRAY) ×2 IMPLANT
PENCIL BUTTON HOLSTER BLD 10FT (ELECTRODE) ×2 IMPLANT
RELOAD PROXIMATE 75MM BLUE (ENDOMECHANICALS) IMPLANT
RELOAD STAPLE 75 3.8 BLU REG (ENDOMECHANICALS) IMPLANT
RETRACTOR WND ALEXIS 18 MED (MISCELLANEOUS) IMPLANT
RTRCTR WOUND ALEXIS 18CM MED (MISCELLANEOUS) ×1
SCRUB CHG 4% DYNA-HEX 4OZ (MISCELLANEOUS) ×2 IMPLANT
SPIKE FLUID TRANSFER (MISCELLANEOUS) ×2 IMPLANT
STAPLER 90 3.5 STAND SLIM (STAPLE) ×1
STAPLER 90 3.5 STD SLIM (STAPLE) IMPLANT
STAPLER PROXIMATE 75MM BLUE (STAPLE) IMPLANT
STAPLER VISISTAT 35W (STAPLE) ×2 IMPLANT
SUCTION POOLE HANDLE (INSTRUMENTS) ×1
SURGILUBE 2OZ TUBE FLIPTOP (MISCELLANEOUS) ×2 IMPLANT
SUT CHROMIC 2 0 SH (SUTURE) ×2 IMPLANT
SUT MNCRL AB 4-0 PS2 18 (SUTURE) ×2 IMPLANT
SUT PDS AB 1 TP1 96 (SUTURE) ×4 IMPLANT
SUT SILK 2 0 (SUTURE)
SUT SILK 2 0 SH CR/8 (SUTURE) ×2 IMPLANT
SUT SILK 2-0 18XBRD TIE 12 (SUTURE) IMPLANT
SUT VIC AB 2-0 SH 18 (SUTURE) IMPLANT
SUT VIC AB 2-0 UR6 27 (SUTURE) ×12 IMPLANT
SUT VICRYL 0 UR6 27IN ABS (SUTURE) ×2 IMPLANT
SYR 20ML LL LF (SYRINGE) ×2 IMPLANT
SYR 3ML LL SCALE MARK (SYRINGE) IMPLANT
SYR BULB IRRIG 60ML STRL (SYRINGE) ×2 IMPLANT
TAPE UMBILICAL 1/8 X36 TWILL (MISCELLANEOUS) ×2 IMPLANT
TOWEL OR 17X26 10 PK STRL BLUE (TOWEL DISPOSABLE) ×4 IMPLANT
TOWEL OR NON WOVEN STRL DISP B (DISPOSABLE) ×4 IMPLANT
TRAY FOLEY MTR SLVR 16FR STAT (SET/KITS/TRAYS/PACK) IMPLANT
YANKAUER SUCT BULB TIP 10FT TU (MISCELLANEOUS) ×2 IMPLANT

## 2022-11-19 NOTE — H&P (Signed)
11/19/2022   PROVIDER: Jarrett Soho, MD  Patient Care Team: Loreen Freud, MD as PCP - General (Internal Medicine) Michaell Cowing, Shawn Route, MD as Consulting Provider (General Surgery) Particia Lather, MD as Resident (Gastroenterology) Thomasene Ripple, DO (Cardiovascular Disease)  [facility] MRN: [id number] DOB: July 14, 1945 DATE OF ENCOUNTER:11/19/2022  Interval History:   The patient returns to the office after undergoing  Robotic drainage pelvic abscess and Hartmann rectosigmoid resection/colostomy on 03/31/2022 Pathology: Diverticulitis with abscess and diverticular associated colitis.  Robotic colostomy takedown with local rectal anastomosis SB resection x 2. Splenic flexure mobilization.and diverting loop ileostomy 08/13/2022   Patient returns from her second operation a month ago. Went home 6 days after surgery. Has had some complaints of feeling lightheaded and dizzy. Got blood work that was negative for any dehydration or infection. To the ER feeling lightheaded. Lab works were okay. She comes in with her husband. She is feeling better. Less lightheaded this week. Appetite not great but getting better. No nausea or vomiting. She is back down her fiber Gummies to just 2 twice a day. Continuing iron twice a day. Switch from loperamide to Lomotil a few times a day. Emptying the ileostomy bag about every 4-6 hours. Walking at home but does not like to go outside. Urinating well. Husband agrees she is feeling better. Husband nervous about her undergoing anesthesia again since she was rather groggy the first few days  PRIOR NOTE 06/02/2022 Patient returns for second postoperative visit. She is feeling better overall. She is here today with her husband. She had a drain study done which showed no more abscess cavity but a fistulous connection to the rectum. Because she had minimal rectal drainage and no output from her drain decision was made to remove the drain. Since the drain has been out she has  noted some rectal drainage but it is tapered off. It is more thick and waxy now. She end up having to come in the office next week to have the drain stitch removed by [place]. She has been wearing a diaper just in case but notes that she is having much less drainage this week.  Her energy level is improving. She felt lightheaded but after holding her blood pressure meds she is feeling better. She followed up with her primary care physician. Her appetites improved. She has been taking fiber Gummies as well as a fiber protein powder and movement emptying her colostomy about 4 times a day. Appetite is definitely improved. She has been walking better. However she did trip and fall at home which concerned her so she has been a little more cautious. She gets some mild soreness and burning around her colostomy but taking an aspirin seems to control it.  PRIOR NOTE Patient returns her office for the first time. She was in the hospital for almost 3 weeks with total colon obstruction from diverticulitis requiring Hartmann resection drainage abscess with postop abscess and prolonged ileus. Eventually went home 04/21/2022. Her daughter came down from [place] and has been helping take care of her. The patient has had some colostomy issues but went to the colostomy clinic last Friday for troubleshooting. She is emptying it about 3-4 times a day. She still has her transgluteal drain for her recurrent pelvic abscess. She is wondering when that can come out. She recalls that there is a radiology appointment later this week. Her appetite is improving. She lost a fair amount of weight but it is stabilizing. Daughter's been giving her some protein powders. Patient  for them to do snacks. She is walking around a little better. Off pain medications. No fevers or chills. Urinating okay. She had postop rectal bleeding. That is gone way down to maybe once a week. She still wears a pad just in case but notes it has not been having any blood  for most of the week. She is walking better. She comes today with a rolling walker just for balance purposes but does not need it at home. She is trying to hope that she can get back to performing. Due to have recital next October.    Labs, Imaging and Diagnostic Testing:  Located in 'Care Everywhere' section of Epic EMR chart  PRIOR [facility] CLINIC NOTES:  Located in 'Care Everywhere' section of Epic EMR chart  SURGERY NOTES:  Located in 'Care Everywhere' section of Epic EMR chart  03/31/2022  POST-OPERATIVE DIAGNOSIS: Rectosigmoid stricture with perforation & abscess, probable diverticulitis  PROCEDURE:  ROBOTIC LOWER ANTERIOR RECTOSIGMOID RESECTION WITH COLOSTOMY DRAINAGE OF PELVIC ABSCESS TRANSVERSUS ABDOMINIS PLANE (TAP) BLOCK - BILATERAL RIGID PROCTOSCOPY  SURGEON: Ardeth Sportsman, MD   OR FINDINGS:   Patient had very twisted rectosigmoid colon spiraled like a corkscrew filling the pelvis. Quite kinked and folded upon itself. At the epicenter of the folded rectosigmoid mesentery was an obvious abscess with contained perforation and some stool. Rectum itself did not seem particularly inflamed. No definite endoluminal mass up to the untwisted tumor but thickened and abnormal.  No obvious metastatic disease on visceral parietal peritoneum or liver.  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 Rectal stump to 11 cm with suture.  CASE DATA:  Type of patient?: LDOW CASE (Surgical Hospitalist [facility] Inpatient)  Status of Case? URGENT Add On  Infection Present At Time Of Surgery (PATOS)? ABSCESS   PATHOLOGY:  Located in 'Care Everywhere' section of Epic EMR chart  Diverticulosis with acute diverticulitis and pericolic abscess consistent with perforation.  Physical Examination:   There is no height or weight on file to calculate BMI.  Constitutional: Awake alert and orient x 4. Excellent short-term and long-term memory recall.  Moving without any difficulty or guarding. Bright and alert. Very inquisitive and chatty.  Eyes: Normal extraocular movements. Sclera nonicteric Neuro: No major focal sensory defects. No major motor deficits. Psych: No severe agitation. No severe anxiety. Judgment & insight Adequate, Oriented x4, HENT: Normocephalic, Mucus membranes moist. No thrush.  Neck: Supple, No tracheal deviation.  Chest: Good respiratory excursion. No audible wheezing CV: No major extremity edema Ext: No obvious deformity or contracture. Edema: not present. No cyanosis Skin: Warm and dry Musculoskeletal: Mobility: no assist device moving easily without restrictions  Abdomen: Incisions Clean & dry with normal healing ridge Nontender. Soft. Nondistended. Ileostomy left abdomen pink with normal rosebud. Oatmeal consistency tan effluent in bag   Gen: Inguinal hernia: Not present. Inguinal lymph nodes: . Wearing a diaper without any urine or stool staining  Rectal: deferred    Assessment and Plan:   Alexandria [name] is a 77 y.o. female recovering s/p urgent Hartmann resection for chronic colon obstruction due to perforated diverticulitis with partially contained abscess and prolonged hospital recovery..  There are no diagnoses linked to this encounter.   She is doing relatively well only a month out from surgery.  Concern of dehydration given her lightheadedness but no proof of that on numerous reevaluations. She is less symptomatic this week than last week.  Continue focusing on nutrition and exercise. Get out  and walk more.   Continue ileostomy regimen. Fiber twice a day. Cut back. She went from 4 pills 3 times a day to 2 pills twice a day. Continue iron twice a day. She seems to feel better with the prescription Lomotil over the loperamide. Do that twice a day and back off. Goal is 4-6 ileostomy emptying's a day which she is already at. I cautioned that you can get lower and that within ileostomy.  Contrast  enema w/o leak  Loop ileostomy takedown in an open fashion. No bowel prep needed. Already had colonoscopy. Does not need firefly. He seemed disappointed that I could not do it now. I noted I had to be safe and thorough in 3 months after prior surgery is standard of care is the soonest. . Husband and daughter are worried about the patient being groggy again postop. Hopefully this will be a quicker case and less likely, but they can discuss with the anesthesia team prior to surgery to make sure the have a less sedating protocol. Will avoid gabapentin preop as well The anatomy & physiology of the digestive tract was discussed. The pathophysiology was discussed. Possibility of remaining with an ostomy permanently was discussed. I offered ostomy takedown. Laparoscopic & open techniques were discussed.   Risks such as bleeding, infection, abscess, leak, reoperation, possible re-ostomy, injury to other organs, need for repair of tissues / organs, need for further treatment, hernia, heart attack, death, and other risks were discussed. I noted a good likelihood this will help address the problem. Goals of post-operative recovery were discussed as well. We will work to minimize complications. Questions were answered. The patient expresses understanding & wishes to proceed with surgery.  Ardeth Sportsman, MD, FACS, MASCRS Esophageal, Gastrointestinal & Colorectal Surgery Robotic and Minimally Invasive Surgery  [facility] [address], [address] [place], [place] [address] [phone] Fax [phone] Main  CONTACT INFORMATION: Weekday (9AM-5PM): Call [facility] main office at [phone] Weeknight (5PM-9AM) or Weekend/Holiday: Check EPIC "Web Links" tab & use "AMION" (password " [email or web address]") for General Surgery [facility] coverage  Please, DO NOT use SecureChat  (it is not reliable communication to reach operating surgeons & will lead to a delay in care).   Epic  staff messaging available for outptient concerns needing 1-2 business day response.     11/19/2022

## 2022-11-19 NOTE — Interval H&P Note (Signed)
History and Physical Interval Note:  11/19/2022 7:19 AM  Alexandria [name]  has presented today for surgery, with the diagnosis of loop ileostomy in place for fecal diversion.  The various methods of treatment have been discussed with the patient and family. After consideration of risks, benefits and other options for treatment, the patient has consented to  Procedure(s): OPEN TAKEDOWN OF LOOP ILEOSTOMY (N/A) ANORECTAL EXAM UNDER ANESTHESIA (N/A) as a surgical intervention.  The patient's history has been reviewed, patient examined, no change in status, stable for surgery.  I have reviewed the patient's chart and labs.  Questions were answered to the patient's satisfaction.    I have re-reviewed the the patient's records, history, medications, and allergies.  I have re-examined the patient.  I again discussed intraoperative plans and goals of post-operative recovery.  The patient agrees to proceed.  Alexandria [name]  19-Jan-1946 [id number]  Patient Care Team: Zola Button, Grayling Congress, DO as PCP - General (Family Medicine) Thomasene Ripple, DO as PCP - Cardiology (Cardiology) Freddy Finner, MD (Inactive) as Consulting Physician (Obstetrics and Gynecology) Thomasene Ripple, DO as Consulting Physician (Cardiology) Imogene Burn, MD as Consulting Physician (Gastroenterology) Karie Soda, MD as Consulting Physician (General Surgery)  Patient Active Problem List   Diagnosis Date Noted   Ileostomy present [facility]) 08/14/2022    Priority: Medium    Ileostomy care (HCC) 11/18/2022   Diaper candidiasis 09/28/2022   Ileostomy, has currently (HCC) 09/28/2022   At risk for dehydration 09/16/2022   At high risk for dehydration 09/14/2022   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   Dehydration 08/26/2022   Irritant contact dermatitis associated with digestive stoma 08/26/2022   Ileostomy in place [facility]) 08/26/2022   High output ileostomy (HCC) 08/25/2022   Diastolic dysfunction 08/14/2022   Chronic facial  pain 08/14/2022   Dyspepsia 08/14/2022   Blowout of rectal stump (HCC) 08/14/2022   Diverticulitis of colon 08/13/2022   Colostomy care (HCC) 07/31/2022   Bronchospasm 06/16/2022   Temporal arteritis (HCC) 06/16/2022   Chronic obstructive pulmonary disease with (acute) lower respiratory infection (HCC) 06/16/2022   Angina pectoris with documented spasm (HCC) 06/16/2022   Abdominal aortic aneurysm (AAA) without rupture (HCC) 06/16/2022   Right ankle sprain 06/09/2022   Sprain of right ankle 06/01/2022   Dizziness 06/01/2022   Rectal abscess 06/01/2022   Acute right ankle pain 05/19/2022   Irritant contact dermatitis associated with fecal stoma 05/02/2022   Slow transit constipation 05/02/2022   Colostomy complication (HCC) 05/02/2022   IDA (iron deficiency anemia) 04/19/2022   Intra-abdominal abscess (HCC) 04/18/2022   Acute blood loss anemia 04/17/2022   Chronic diastolic CHF (congestive heart failure) (HCC) 04/17/2022   Toxic metabolic encephalopathy 04/17/2022   Ileus following gastrointestinal surgery (HCC) 04/17/2022   Pelvic abscess in female 03/30/2022   Flat foot 11/19/2021   S/P total knee arthroplasty, left 11/13/2021   Ankle impingement syndrome, left 11/13/2021   Rectal bleeding 08/22/2021   Abnormal thyroid blood test 08/22/2021   Abnormal kidney function 08/22/2021   Dumping syndrome 03/13/2020   Arthritis    Bronchial pneumonia    Colon polyp    DVT (deep venous thrombosis) (HCC)    Heart murmur    Migraines    Pancreatitis    Thyroid disease    Aortic root dilatation (HCC) 11/24/2019   Leukocytosis 11/06/2019   Generalized abdominal pain 11/06/2019   Pre-ulcerative corn or callous 09/07/2019   Nausea 09/07/2019   Pain of left calf 05/09/2019   Tibial  pain 05/09/2019   Essential hypertension 02/08/2019   Healthcare maintenance 11/10/2018   Bradycardia 07/22/2018   Pansinusitis 06/23/2018   History of transient ischemic attack (TIA) 03/24/2018   IPF  (idiopathic pulmonary fibrosis) (HCC) 03/24/2018   Allergic rhinitis 02/16/2018   Eustachian tube dysfunction, bilateral 02/16/2018   URI (upper respiratory infection) 02/16/2018   Seasonal allergic rhinitis due to pollen 02/16/2018   ILD (interstitial lung disease) (HCC) 01/07/2018   Dyspnea 01/07/2018   Acute respiratory failure (HCC) 01/06/2018   TIA (transient ischemic attack) 01/06/2018   Hypokalemia 12/15/2017   Chronic cough 11/26/2017   Basal cell carcinoma (BCC) of neck 06/07/2017   Anisometropia 06/02/2017   Drusen of macula of both eyes 06/02/2017   Photopsia of left eye 06/02/2017   Cardiac murmur 05/07/2017   Situational anxiety 05/07/2017   Headache 05/07/2017   Mitral and aortic insufficiency 05/07/2017   Migraine without status migrainosus, not intractable 06/09/2016   Insomnia 11/18/2015   Normal coronary arteries 05/20/2015   RBBB 05/20/2015   Abnormal CT of the chest 05/20/2015   Hx of myocardial infarction 05/08/2015   Bruising 05/08/2015   Upper airway cough syndrome 02/14/2015   Asthmatic bronchitis with acute exacerbation 02/06/2015   Mild intermittent asthma without complication 02/06/2015   Oliguria 12/28/2014   Hypothyroidism 10/25/2014   Fatigue 10/25/2014   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   Chronic venous insufficiency 10/24/2014   Varicose veins of leg with complications 10/24/2014   Degeneration of intervertebral disc of cervical region 06/29/2014   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   Generalized osteoarthritis of multiple sites 06/29/2014   History of stroke without residual deficits 06/29/2014   Palpitations 11/08/2013   Edema of lower extremity 10/03/2013   Hair loss 11/25/2012   Alopecia 11/25/2012   Diverticulitis of large intestine with perforation and abscess 07/14/2012   Hammer toe 06/13/2012   History of stress test 05/21/2011   Hx of echocardiogram 01/27/2010   H/O cardiac catheterization 2004     Past Medical History:  Diagnosis Date   Arthritis    Bronchial pneumonia    Chronic facial pain 08/14/2022   Colon polyp    Diverticulitis    DVT (deep venous thrombosis) (HCC)    lower extremity   H/O cardiac catheterization 2004   Normal coronary arteries   Heart murmur    History of stress test 05/21/2011   Hx of echocardiogram 01/27/2010   Normal Ef 55% the transmitral spectral doppler flow pattern is normal for age. the left ventricular wall motion is normal   Hypothyroidism    IPF (idiopathic pulmonary fibrosis) (HCC) 01/2018   Myocardial infarction (HCC)    hx of 30 years ago   Pancreatitis    TIA (transient ischemic attack)    8 years ago    Past Surgical History:  Procedure Laterality Date   BREAST BIOPSY Left    [facility]   CARDIAC CATHETERIZATION     10/2013   CATARACT EXTRACTION Bilateral 03/22/2018   FLEXIBLE SIGMOIDOSCOPY N/A 08/13/2022   Procedure: FLEXIBLE SIGMOIDOSCOPY;  Surgeon: Karie Soda, MD;  Location: [facility];  Service: General;  Laterality: N/A;   LEFT HEART CATHETERIZATION WITH CORONARY ANGIOGRAM N/A 10/25/2013   Procedure: LEFT HEART CATHETERIZATION WITH CORONARY ANGIOGRAM;  Surgeon: Lennette Bihari, MD;  Location[facility] CATH LAB;  Service: Cardiovascular;  Laterality: N/A;   LYSIS OF ADHESION N/A 08/13/2022   Procedure: LYSIS OF ADHESION, DIVERTING ILEOSTOMY;  Surgeon: Karie Soda, MD;  Location: [facility];  Service: General;  Laterality:  N/A;   NECK SURGERY     ACDF x 2      done in [place] more than 25 years, doesn't know levels   TOTAL KNEE ARTHROPLASTY     Bilateral x's 2   VAGINAL HYSTERECTOMY  10/17/1998   Ron Jennette Kettle   VIDEO BRONCHOSCOPY Bilateral 01/24/2018   Procedure: VIDEO BRONCHOSCOPY WITH FLUORO;  Surgeon: Lupita Leash, MD;  Location: [facility] ENDOSCOPY;  Service: Cardiopulmonary;  Laterality: Bilateral;   XI ROBOTIC ASSISTED COLOSTOMY TAKEDOWN N/A 08/13/2022   Procedure: ROBOTIC OSTOMY TAKEDOWN, SMALL BOWEL RESECTION X 2, BILATERAL TAP  BLOCK, TISSUE PERFUSSION ASSESMENT VIA FIREFLY, MOBILAZATION OF SPLENIC FLEXURE;  Surgeon: Karie Soda, MD;  Location: [facility];  Service: General;  Laterality: N/A;   XI ROBOTIC ASSISTED LOWER ANTERIOR RESECTION N/A 03/31/2022   Procedure: XI ROBOTIC ASSISTED LOWER ANTERIOR RESECTION;  Surgeon: Karie Soda, MD;  Location: [facility];  Service: General;  Laterality: N/A;    Social History   Socioeconomic History   Marital status: Married    Spouse name: Tasia Catchings   Number of children: 3   Years of education: Jr college   Highest education level: Not on file  Occupational History   Occupation: Leisure centre manager: UNEMPLOYED   Occupation: retired  Tobacco Use   Smoking status: Never   Smokeless tobacco: Never  Vaping Use   Vaping status: Never Used  Substance and Sexual Activity   Alcohol use: No    Alcohol/week: 0.0 standard drinks of alcohol   Drug use: No   Sexual activity: Not Currently  Other Topics Concern   Not on file  Social History Narrative   Lives with husband Tasia Catchings   Caffeine use: coffee (2 cups per day)   Mostly right-handed   Social Determinants of Health   Financial Resource Strain: Not on file  Food Insecurity: No Food Insecurity (08/24/2022)   Hunger Vital Sign    Worried About Running Out of Food in the Last Year: Never true    Ran Out of Food in the Last Year: Never true  Transportation Needs: No Transportation Needs (08/24/2022)   PRAPARE - Administrator, Civil Service (Medical): No    Lack of Transportation (Non-Medical): No  Physical Activity: Not on file  Stress: Not on file  Social Connections: Not on file  Intimate Partner Violence: Not At Risk (08/14/2022)   Humiliation, Afraid, Rape, and Kick questionnaire    Fear of Current or Ex-Partner: No    Emotionally Abused: No    Physically Abused: No    Sexually Abused: No    Family History  Problem Relation Age of Onset   Ovarian cancer Mother    Uterine cancer Mother    Lung cancer  Father    HIV Brother        1982   Kidney cancer Brother    Lung cancer Brother    Other Brother        Mouth Cancer   Colon cancer Maternal Aunt    Heart disease Maternal Grandmother    Stroke Maternal Grandmother    Hypertension Maternal Grandmother    Diabetes Maternal Grandmother    Arthritis Other    Esophageal cancer Neg Hx    Liver disease Neg Hx    Rectal cancer Neg Hx    Stomach cancer Neg Hx     Medications Prior to Admission  Medication Sig Dispense Refill Last Dose   aspirin EC 81 MG tablet Take 81 mg  by mouth 3 (three) times a week. Swallow whole.      diphenoxylate-atropine (LOMOTIL) 2.5-0.025 MG tablet Take 1 tablet by mouth 4 (four) times daily.   Past Month   ferrous sulfate 325 (65 FE) MG tablet Take 1 tablet (325 mg total) by mouth 2 (two) times daily with a meal. 60 tablet 2 11/18/2022   Multiple Vitamin (MULTIVITAMIN WITH MINERALS) TABS tablet Take 1 tablet by mouth once a week.   Past Week   OFEV 100 MG CAPS Take 100 mg by mouth 2 (two) times daily.   Past Week   ondansetron (ZOFRAN) 4 MG tablet TAKE 1 TABLET BY MOUTH EVERY 8 HOURS AS NEEDED FOR NAUSEA AND VOMITING 20 tablet 0 11/19/2022 at 0430   potassium chloride SA (KLOR-CON M) 20 MEQ tablet Take 20 mEq by mouth daily.   11/18/2022   SYNTHROID 112 MCG tablet TAKE 1 TABLET BY MOUTH EVERY DAY BEFORE BREAKFAST 90 tablet 0 11/19/2022 at 0415   vitamin C (ASCORBIC ACID) 500 MG tablet Take 500 mg by mouth 2 (two) times a week.   Past Week   amoxicillin-clavulanate (AUGMENTIN) 875-125 MG tablet Take 1 tablet by mouth 2 (two) times daily. (Patient not taking: Reported on 11/04/2022) 20 tablet 0 Not Taking   benzonatate (TESSALON PERLES) 100 MG capsule Take 1 capsule (100 mg total) by mouth 3 (three) times daily as needed for cough. (Patient not taking: Reported on 11/04/2022) 20 capsule 0 Not Taking   loperamide (IMODIUM) 2 MG capsule Take 2 capsules (4 mg total) by mouth every 6 (six) hours as needed for diarrhea or  loose stools (Use if >314mL ileostomy output every 6 hours). (Patient not taking: Reported on 11/04/2022) 90 capsule 3 Not Taking   methocarbamol (ROBAXIN) 500 MG tablet Take 1 tablet (500 mg total) by mouth every 6 (six) hours as needed for muscle spasms. (Patient not taking: Reported on 11/04/2022) 20 tablet 2 Not Taking   traMADol (ULTRAM) 50 MG tablet Take 1-2 tablets (50-100 mg total) by mouth every 6 (six) hours as needed for moderate pain. (Patient not taking: Reported on 11/04/2022) 20 tablet 0 Not Taking    Current Facility-Administered Medications  Medication Dose Route Frequency Provider Last Rate Last Admin   bupivacaine liposome (EXPAREL) 1.3 % injection 266 mg  20 mL Infiltration Once Karie Soda, MD       cefoTEtan (CEFOTAN) 2 g in sodium chloride 0.9 % 100 mL IVPB  2 g Intravenous On Call to OR Karie Soda, MD       Chlorhexidine Gluconate Cloth 2 % PADS 6 each  6 each Topical Once Karie Soda, MD       And   Chlorhexidine Gluconate Cloth 2 % PADS 6 each  6 each Topical Once Karie Soda, MD       lactated ringers infusion   Intravenous Continuous Shelton Silvas, MD         Allergies  Allergen Reactions   Prednisone Other (See Comments)    Changed personality    Dilaudid [Hydromorphone] Itching and Other (See Comments)    Dilaudid caused marked confusion   Atrovent Hfa [Ipratropium Bromide Hfa] Itching and Other (See Comments)    Pt could not sleep, Insomnia    Cheratussin Ac [Guaifenesin-Codeine] Other (See Comments)    Headaches    Diltiazem Itching and Other (See Comments)    Makes patient sick. Shakes. GI   Hydrocodone Itching   Influenza Vaccines Other (See Comments)    Pt  reports heart attack after last flu shot   Morphine Itching   Motrin [Ibuprofen] Itching and Other (See Comments)    "Gives false reading in blood"   Oxycodone Itching   Codeine Itching, Nausea Only and Other (See Comments)    Pt takes promethazine with codeine at home    BP (!)  166/78 Comment: RN notified  Pulse 65   Temp 98.2 F (36.8 C) (Oral)   Resp 18   Ht 5\' 3"  (1.6 m)   Wt 62.6 kg   SpO2 95%   BMI 24.45 kg/m   Labs: No results found. However, due to the size of the patient record, not all encounters were searched. Please check Results Review for a complete set of results.  Imaging / Studies: No results found.   Ardeth Sportsman, M.D., F.A.C.S. Gastrointestinal and Minimally Invasive Surgery [facility] [address] [place], [place] [address] [phone] Main / Paging  11/19/2022 7:19 AM     Ardeth Sportsman

## 2022-11-19 NOTE — Anesthesia Postprocedure Evaluation (Signed)
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  Last Vitals:  Vitals:   11/19/22 0945 11/19/22 1010  BP: (!) 197/71 (!) 193/74  Pulse: (!) 53   Resp: 20   Temp:    SpO2: 94%     Last Pain:  Vitals:   11/19/22 0945  TempSrc:   PainSc: 5                  , A.

## 2022-11-19 NOTE — Discharge Instructions (Addendum)
SURGERY: POST OP INSTRUCTIONS (Surgery for small bowel obstruction, colon resection, etc)   ######################################################################  EAT Gradually transition to a high fiber diet with a fiber supplement over the next few days after discharge  WALK Walk an hour a day.  Control your pain to do that.    CONTROL PAIN Control pain so that you can walk, sleep, tolerate sneezing/coughing, go up/down stairs.  HAVE A BOWEL MOVEMENT DAILY Keep your bowels regular to avoid problems.  OK to try a laxative to override constipation.  OK to use an antidairrheal to slow down diarrhea.  Call if not better after 2 tries  CALL IF YOU HAVE PROBLEMS/CONCERNS Call if you are still struggling despite following these instructions. Call if you have concerns not answered by these instructions  ######################################################################   DIET Follow a light diet the first few days at home.  Start with a bland diet such as soups, liquids, starchy foods, low fat foods, etc.  If you feel full, bloated, or constipated, stay on a ful liquid or pureed/blenderized diet for a few days until you feel better and no longer constipated. Be sure to drink plenty of fluids every day to avoid getting dehydrated (feeling dizzy, not urinating, etc.). Gradually add a fiber supplement to your diet over the next week.  Gradually get back to a regular solid diet.  Avoid fast food or heavy meals the first week as you are more likely to get nauseated. It is expected for your digestive tract to need a few months to get back to normal.  It is common for your bowel movements and stools to be irregular.  You will have occasional bloating and cramping that should eventually fade away.  Until you are eating solid food normally, off all pain medications, and back to regular activities; your bowels will not be normal. Focus on eating a low-fat, high fiber diet the rest of your life  (See Getting to Good Bowel Health, below).  CARE of your INCISION or WOUND  It is good for closed incisions and even open wounds to be washed every day.  Shower every day.  Short baths are fine.  Wash the incisions and wounds clean with soap & water.    You may leave closed incisions open to air if it is dry.   You may cover the incision with clean gauze & replace it after your daily shower for comfort.  TEGADERM & WICKS:  You have clear gauze band-aid dressings over your closed incision(s).  Remove the dressings & shoelace ribbon wicks in your largest incision 3 days after surgery.= Sunday 8/18    If you have an open wound with a wound vac, see wound vac care instructions.    ACTIVITIES as tolerated Start light daily activities --- self-care, walking, climbing stairs-- beginning the day after surgery.  Gradually increase activities as tolerated.  Control your pain to be active.  Stop when you are tired.  Ideally, walk several times a day, eventually an hour a day.   Most people are back to most day-to-day activities in a few weeks.  It takes 4-8 weeks to get back to unrestricted, intense activity. If you can walk 30 minutes without difficulty, it is safe to try more intense activity such as jogging, treadmill, bicycling, low-impact aerobics, swimming, etc. Save the most intensive and strenuous activity for last (Usually 4-8 weeks after surgery) such as sit-ups, heavy lifting, contact sports, etc.  Refrain from any intense heavy lifting or straining until you are  off narcotics for pain control.  You will have off days, but things should improve week-by-week. DO NOT PUSH THROUGH PAIN.  Let pain be your guide: If it hurts to do something, don't do it.  Pain is your body warning you to avoid that activity for another week until the pain goes down. You may drive when you are no longer taking narcotic prescription pain medication, you can comfortably wear a seatbelt, and you can safely make sudden  turns/stops to protect yourself without hesitating due to pain. You may have sexual intercourse when it is comfortable. If it hurts to do something, stop.   MEDICATIONS Take your usually prescribed home medications unless otherwise directed.   Blood thinners:  You can restart any strong blood thinners after the second postoperative day.  It is OK to continue aspirin before & after surgery..    Some blood in BMs the first 1-2 weeks is common but should taper down & be small volume.  If you are passing many large clots, call your surgeon    PAIN CONTROL Pain after surgery or related to activity is often due to strain/injury to muscle, tendon, nerves and/or incisions.  This pain is usually short-term and will improve in a few months.  To help speed the process of healing and to get back to regular activity more quickly, DO THE FOLLOWING THINGS TOGETHER: Increase activity gradually.  DO NOT PUSH THROUGH PAIN Use Ice and/or Heat Try Gentle Massage and/or Stretching Take over the counter pain medication Take Narcotic prescription pain medication for more severe pain  Good pain control = faster recovery.  It is better to take more medicine to be more active than to stay in bed all day to avoid medications.  Increase activity gradually Avoid heavy lifting at first, then increase to lifting as tolerated over the next 6 weeks. Do not "push through" the pain.  Listen to your body and avoid positions and maneuvers than reproduce the pain.  Wait a few days before trying something more intense Walking an hour a day is encouraged to help your body recover faster and more safely.  Start slowly and stop when getting sore.  If you can walk 30 minutes without stopping or pain, you can try more intense activity (running, jogging, aerobics, cycling, swimming, treadmill, sex, sports, weightlifting, etc.) Remember: If it hurts to do it, then don't do it! Use Ice and/or Heat You will have swelling and bruising  around the incisions.  This will take several weeks to resolve. Ice packs or heating pads (6-8 times a day, 30-60 minutes at a time) will help sooth soreness & bruising. Some people prefer to use ice alone, heat alone, or alternate between ice & heat.  Experiment and see what works best for you.  Consider trying ice for the first few days to help decrease swelling and bruising; then, switch to heat to help relax sore spots and speed recovery. Shower every day.  Short baths are fine.  It feels good!  Keep the incisions and wounds clean with soap & water.   Try Gentle Massage and/or Stretching Massage at the area of pain many times a day Stop if you feel pain - do not overdo it Take over the counter pain medication This helps the muscle and nerve tissues become less irritable and calm down faster Choose ONE of the following over-the-counter anti-inflammatory medications: Acetaminophen 500mg  tabs (Tylenol) 1-2 pills with every meal and just before bedtime (avoid if you have liver problems  or if you have acetaminophen in you narcotic prescription) Naproxen 220mg  tabs (ex. Aleve, Naprosyn) 1-2 pills twice a day (avoid if you have kidney, stomach, IBD, or bleeding problems) Ibuprofen 200mg  tabs (ex. Advil, Motrin) 3-4 pills with every meal and just before bedtime (avoid if you have kidney, stomach, IBD, or bleeding problems) Take with food/snack several times a day as directed for at least 2 weeks to help keep pain / soreness down & more manageable. Take Narcotic prescription pain medication for more severe pain A prescription for strong pain control is often given to you upon discharge (for example: oxycodone/Percocet, hydrocodone/Norco/Vicodin, or tramadol/Ultram) Take your pain medication as prescribed. Be mindful that most narcotic prescriptions contain Tylenol (acetaminophen) as well - avoid taking too much Tylenol. If you are having problems/concerns with the prescription medicine (does not control  pain, nausea, vomiting, rash, itching, etc.), please call us [phone] to see if we need to switch you to a different pain medicine that will work better for you and/or control your side effects better. If you need a refill on your pain medication, you must call the office before 4 pm and on weekdays only.  By federal law, prescriptions for narcotics cannot be called into a pharmacy.  They must be filled out on paper & picked up from our office by the patient or authorized caretaker.  Prescriptions cannot be filled after 4 pm nor on weekends.    WHEN TO CALL us [phone] Severe uncontrolled or worsening pain  Fever over 101 F (38.5 C) Concerns with the incision: Worsening pain, redness, rash/hives, swelling, bleeding, or drainage Reactions / problems with new medications (itching, rash, hives, nausea, etc.) Nausea and/or vomiting Difficulty urinating Difficulty breathing Worsening fatigue, dizziness, lightheadedness, blurred vision Other concerns If you are not getting better after two weeks or are noticing you are getting worse, contact our office [phone] for further advice.  We may need to adjust your medications, re-evaluate you in the office, send you to the emergency room, or see what other things we can do to help. The clinic staff is available to answer your questions during regular business hours (8:30am-5pm).  Please don't hesitate to call and ask to speak to one of our nurses for clinical concerns.    A surgeon from [facility] is always on call at the hospitals 24 hours/day If you have a medical emergency, go to the nearest emergency room or call 911.  FOLLOW UP in our office One the day of your discharge from the hospital (or the next business weekday), please call [facility] to set up or confirm an appointment to see your surgeon in the office for a follow-up appointment.  Usually it is 2-3 weeks after your surgery.   If you have skin  staples at your incision(s), let the office know so we can set up a time in the office for the nurse to remove them (usually around 10 days after surgery). Make sure that you call for appointments the day of discharge (or the next business weekday) from the hospital to ensure a convenient appointment time. IF YOU HAVE DISABILITY OR FAMILY LEAVE FORMS, BRING THEM TO THE OFFICE FOR PROCESSING.  DO NOT GIVE THEM TO YOUR DOCTOR.  [facility] [address], [address], [place], [place]  [address] ? [phone] - Main [phone] - Toll Free,  [phone] - Fax [email or web address]    GETTING TO GOOD BOWEL HEALTH. It is expected for  your digestive tract to need a few months to get back to normal.  It is common for your bowel movements and stools to be irregular.  You will have occasional bloating and cramping that should eventually fade away.  Until you are eating solid food normally, off all pain medications, and back to regular activities; your bowels will not be normal.   Avoiding constipation The goal: ONE SOFT BOWEL MOVEMENT A DAY!    Drink plenty of fluids.  Choose water first. TAKE A FIBER SUPPLEMENT EVERY DAY THE REST OF YOUR LIFE During your first week back home, gradually add back a fiber supplement every day Experiment which form you can tolerate.   There are many forms such as powders, tablets, wafers, gummies, etc Psyllium bran (Metamucil), methylcellulose (Citrucel), Miralax or Glycolax, Benefiber, Flax Seed.  Adjust the dose week-by-week (1/2 dose/day to 6 doses a day) until you are moving your bowels 1-2 times a day.  Cut back the dose or try a different fiber product if it is giving you problems such as diarrhea or bloating. Sometimes a laxative is needed to help jump-start bowels if constipated until the fiber supplement can help regulate your bowels.  If you are tolerating eating & you are farting, it is okay to try a gentle laxative such as  double dose MiraLax, prune juice, or Milk of Magnesia.  Avoid using laxatives too often. Stool softeners can sometimes help counteract the constipating effects of narcotic pain medicines.  It can also cause diarrhea, so avoid using for too long. If you are still constipated despite taking fiber daily, eating solids, and a few doses of laxatives, call our office. Controlling diarrhea Try drinking liquids and eating bland foods for a few days to avoid stressing your intestines further. Avoid dairy products (especially milk & ice cream) for a short time.  The intestines often can lose the ability to digest lactose when stressed. Avoid foods that cause gassiness or bloating.  Typical foods include beans and other legumes, cabbage, broccoli, and dairy foods.  Avoid greasy, spicy, fast foods.  Every person has some sensitivity to other foods, so listen to your body and avoid those foods that trigger problems for you. Probiotics (such as active yogurt, Align, etc) may help repopulate the intestines and colon with normal bacteria and calm down a sensitive digestive tract Adding a fiber supplement gradually can help thicken stools by absorbing excess fluid and retrain the intestines to act more normally.  Slowly increase the dose over a few weeks.  Too much fiber too soon can backfire and cause cramping & bloating. It is okay to try and slow down diarrhea with a few doses of antidiarrheal medicines.   Bismuth subsalicylate (ex. Kayopectate, Pepto Bismol) for a few doses can help control diarrhea.  Avoid if pregnant.   Loperamide (Imodium) can slow down diarrhea.  Start with one tablet (2mg ) first.  Avoid if you are having fevers or severe pain.  ILEOSTOMY PATIENTS WILL HAVE CHRONIC DIARRHEA since their colon is not in use.    Drink plenty of liquids.  You will need to drink even more glasses of water/liquid a day to avoid getting dehydrated. Record output from your ileostomy.  Expect to empty the bag every 3-4  hours at first.  Most people with a permanent ileostomy empty their bag 4-6 times at the least.   Use antidiarrheal medicine (especially Imodium) several times a day to avoid getting dehydrated.  Start with a dose at bedtime & breakfast.  Adjust up or down as needed.  Increase antidiarrheal medications as directed to avoid emptying the bag more than 8 times a day (every 3 hours). Work with your wound ostomy nurse to learn care for your ostomy.  See ostomy care instructions. TROUBLESHOOTING IRREGULAR BOWELS 1) Start with a soft & bland diet. No spicy, greasy, or fried foods.  2) Avoid gluten/wheat or dairy products from diet to see if symptoms improve. 3) Miralax 17gm or flax seed mixed in 8oz. water or juice-daily. May use 2-4 times a day as needed. 4) Gas-X, Phazyme, etc. as needed for gas & bloating.  5) Prilosec (omeprazole) over-the-counter as needed 6)  Consider probiotics (Align, Activa, etc) to help calm the bowels down  Call your doctor if you are getting worse or not getting better.  Sometimes further testing (cultures, endoscopy, X-ray studies, CT scans, bloodwork, etc.) may be needed to help diagnose and treat the cause of the diarrhea. [facility] [address], [place], [place]  [address] [phone] - Main.    [phone]  - Toll Free.   [phone] - Fax [email or web address]    Pelvic floor muscle training exercises ("Kegels") can help strengthen the muscles under the uterus, bladder, and bowel (large intestine). They can help both men and women who have problems with urine leakage or bowel control.  A pelvic floor muscle training exercise is like pretending that you have to urinate, and then holding it. You relax and tighten the muscles that control urine flow. It's important to find the right muscles to tighten.  The next time you have to urinate, start to go and then stop. Feel the muscles in your vagina, bladder, or anus  get tight and move up. These are the pelvic floor muscles. If you feel them tighten, you've done the exercise right. If you are still not sure whether you are tightening the right muscles, keep in mind that all of the muscles of the pelvic floor relax and contract at the same time. Because these muscles control the bladder, rectum, and vagina, the following tips may help: Women: Insert a finger into your vagina. Tighten the muscles as if you are holding in your urine, then let go. You should feel the muscles tighten and move up and down.  Men: Insert a finger into your rectum. Tighten the muscles as if you are holding in your urine, then let go. You should feel the muscles tighten and move up and down. These are the same muscles you would tighten if you were trying to prevent yourself from passing gas.  It is very important that you keep the following muscles relaxed while doing pelvic floor muscle training exercises: Abdominal  Buttocks (the deeper, anal sphincter muscle should contract)  Thigh   A woman can also strengthen these muscles by using a vaginal cone, which is a weighted device that is inserted into the vagina. Then you try to tighten the pelvic floor muscles to hold the device in place. If you are unsure whether you are doing the pelvic floor muscle training correctly, you can use biofeedback and electrical stimulation to help find the correct muscle group to work. Biofeedback is a method of positive reinforcement. Electrodes are placed on the abdomen and along the anal area. Some therapists place a sensor in the vagina in women or anus in men to monitor the contraction of pelvic floor muscles.  A monitor will display a graph showing which muscles are contracting and which  are at rest. The therapist can help find the right muscles for performing pelvic floor muscle training exercises.   PERFORMING PELVIC FLOOR EXERCISES: 1. Begin by emptying your bladder. 2. Tighten the pelvic floor  muscles and hold for a count of 10. 3. Relax the muscles completely for a count of 10. 4. Do 10 repititions, 3 to 5 times a day (morning, afternoon, and night). You can do these exercises at any time and any place. Most people prefer to do the exercises while lying down or sitting in a chair. After 4 - 6 weeks, most people notice some improvement. It may take as long as 3 months to see a major change. After a couple of weeks, you can also try doing a single pelvic floor contraction at times when you are likely to leak (for example, while getting out of a chair). A word of caution: Some people feel that they can speed up the progress by increasing the number of repetitions and the frequency of exercises. However, over-exercising can instead cause muscle fatigue and increase urine leakage. If you feel any discomfort in your abdomen or back while doing these exercises, you are probably doing them wrong. Breathe deeply and relax your body when you are doing these exercises. Make sure you are not tightening your stomach, thigh, buttock, or chest muscles. When done the right way, pelvic floor muscle exercises have been shown to be very effective at improving urinary continence.  Pelvic Floor Pain / Incontinence  Do you suffer from pelvic pain or incontinence? Do you have pain in the pelvis, low back or hips that is associated with sitting, walking, urination or intercourse? Have you experienced leaking of urine or feces when coughing, sneezing or laughing? Do you have pain in the pelvic area associated with cancer?  These are conditions that are common with pelvic floor muscle dysfunction. Over time, due to stress, scar tissue, surgeries and the natural course of aging, our muscles may become weak or overstressed and can spasm. This can lead to pain, weakness, incontinence or decreased quality of life.  Men and women with pelvic floor dysfunction frequently describe:  A "falling out" feeling. Pain or  burning in the abdomen, tailbone or perineal area. Constipation or bowel elimination problems or difficulty initiating urination. Unresolved low back or hip pain. Frequency and urgency when going to the bathroom. Leaking of urine or feces. Pain with intercourse.  [email or web address]  To make a referral or for more information about [facility] Pelvic Floor Therapy Program, call  [facility]) - [phone] [place] Ace [phone] [place]) - [phone] [facility]) - [phone]

## 2022-11-19 NOTE — Op Note (Signed)
11/19/2022  9:21 AM  PATIENT:  Alexandria [name]  77 y.o. 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   (See anesthesia record)  Delay start of Pharmacological VTE agent (>24hrs) due to concerns of significant anemia, surgical blood loss, or risk of bleeding?:  no  DRAINS: (None)  SPECIMEN:  Ileostomy  DISPOSITION OF SPECIMEN:  Pathology  COUNTS:  Sponge, needle, & instrument counts CORRECT  PLAN OF CARE: Admit to inpatient   PATIENT DISPOSITION:  PACU - hemodynamically stable.  INDICATION:  Patient requiring diverting loop ileostomy.  The patient has recovered to the point not requiring fecal diversion.  Requested ostomy takedown.  I felt was appropriate this time. I recommended takedown of ileostomy and examination under anesthesia:   The anatomy & physiology of the digestive tract was discussed. The pathophysiology was  discussed. Possibility of remaining with an ostomy permanently was discussed. I offered ostomy takedown. Laparoscopic & open techniques were discussed.   Risks such as bleeding, infection, abscess, leak, reoperation, possible re-ostomy, injury to other organs, hernia, heart attack, death, and other risks were discussed. I noted a good likelihood this will help address the problem. Goals of post-operative recovery were discussed as well. We will work to minimize complications. Questions were answered. The patient expresses understanding & wishes to proceed with surgery.   OR FINDINGS:   No stricture at rectal anastomosis.  Gently dilated.  Anastomosis well healed.   Moderate adhesions of loop ileostomy to abdominal wall.  No major parastomal hernia.   It is a side-to-side stapled ileoileal anastomosis close to the terminal ileum.   CASE DATA: Type of patient?: Elective [facility] Private Case Status of Case? Elective Scheduled Infection Present At Time Of Surgery (PATOS)?  NO  DESCRIPTION:   Informed consent was confirmed. The patient received IV antibiotics and underwent general anesthesia without any difficulty. The patient was positioned in low litholtomy. Foley catheter was sterilely placed. SCDs were active during the entire case. The abdomen was cprepped and draped in a sterile fashion. A surgical timeout confirmed our plan.   I performed examination under anesthesia of the perineum. I can palpate the anastomosis in the neorectum -circular EEA anastomosis about 6 cm from the anal verge.  No narrowing or stricturing..  Could easily pass 2 fingers.  No evidence of fistula leak or irritation. Sphincter intact.   I changed gown/gloves. Surgical timeout confirmed for ileostomy takedown. I proceeded to make a circular incision around the loop ileostomy. The loop ileostomy was freed from adhesions to the subcutaneous and fascial layers of the abdominal wall using  focused cautery and sharp dissection.  I  was able to breech safely into the peritoneum. No adhesions felt. We could eviscerate the small bowel just proximal/distal to the loop ileostomy.   We did a side-to-side stapled anastomosis using a GIA-75 stapler.  We then used a TLX 90 to staple off the common defect.   I used interrupted 2-0 silk stitches to close the mesenteric defect transversely and do mesenteo-pexy over the TX staple line.  Another silk suture placed at the crotch of proximal end of the anastomosis for antitension.  Hemostasis was excellent. Ileostomy returned into the peritoneal cavity.  We changed gloves.  We assured hemostasis and the former ostomy wound.  Wound irrigated.  I closed the posterior rectus fascia with 0 Vicryl suture.  Anterior rectus fascia was closed using #1 PDS in a running fashion transversely. Skin edges trimmed to remove excess scarring and have healthy edges.    I excised redundant tissue to have a transverse biconcave wound.  I closed this with interrupted suture Vicryl at Scarpa's and deep dermal 4-0 monocryl sutures.  Placed umbilical tape wicks x2 in the wound to allow deeper drainage..  Sterile dressing placed.   Patient extubated and in recovery room in stable condition.  I discussed postoperative plans for recovery with the patient in the holding area.  Instructions are written.  I discussed operative findings, updated the patient's status, discussed probable steps to recovery, and gave postoperative recommendations to the patient's daughter, Rhoderick Moody .  Recommendations were made.  Questions were answered.  She expressed understanding & appreciation.  Ardeth Sportsman, MD, FACS, MASCRS Esophageal, Gastrointestinal & Colorectal Surgery Robotic and Minimally Invasive Surgery  [facility], [facility]  [facility]  [address], [address] [place], [place] [address] [phone] Fax [phone] Main

## 2022-11-19 NOTE — Transfer of Care (Signed)
Immediate Anesthesia Transfer of Care Note  Patient: Alexandria [name]  Procedure(s) Performed: Procedure(s): OPEN TAKEDOWN OF LOOP ILEOSTOMY (N/A) ANORECTAL EXAM UNDER ANESTHESIA (N/A)  Patient Location: PACU  Anesthesia Type:General  Level of Consciousness:  sedated, patient cooperative and responds to stimulation  Airway & Oxygen Therapy:Patient Spontanous Breathing and Patient connected to face mask oxgen  Post-op Assessment:  Report given to PACU RN and Post -op Vital signs reviewed and stable  Post vital signs:  Reviewed and stable  Last Vitals:  Vitals:   11/19/22 0619 11/19/22 0921  BP: (!) 166/78   Pulse: 65 (!) 54  Resp: 18 20  Temp: 36.8 C   SpO2: 95% 100%    Complications: No apparent anesthesia complications

## 2022-11-19 NOTE — Anesthesia Procedure Notes (Signed)
Procedure Name: Intubation Date/Time: 11/19/2022 7:59 AM  Performed by: Theodosia Quay, CRNAPre-anesthesia Checklist: Patient identified, Emergency Drugs available, Suction available, Patient being monitored and Timeout performed Patient Re-evaluated:Patient Re-evaluated prior to induction Oxygen Delivery Method: Circle system utilized Preoxygenation: Pre-oxygenation with 100% oxygen Induction Type: IV induction Ventilation: Mask ventilation without difficulty Laryngoscope Size: Mac and 3 Grade View: Grade I Tube type: Oral Tube size: 6.5 mm Number of attempts: 1 Airway Equipment and Method: Stylet Placement Confirmation: ETT inserted through vocal cords under direct vision, positive ETCO2, CO2 detector and breath sounds checked- equal and bilateral Secured at: 21 cm Tube secured with: Tape Dental Injury: Teeth and Oropharynx as per pre-operative assessment  Comments: ATOI

## 2022-11-20 ENCOUNTER — Other Ambulatory Visit: Payer: Self-pay

## 2022-11-20 ENCOUNTER — Encounter (HOSPITAL_COMMUNITY): Payer: Self-pay | Admitting: Surgery

## 2022-11-20 LAB — CBC
HCT: 34.2 % — ABNORMAL LOW (ref 36.0–46.0)
Hemoglobin: 11.1 g/dL — ABNORMAL LOW (ref 12.0–15.0)
MCH: 32.6 pg (ref 26.0–34.0)
MCHC: 32.5 g/dL (ref 30.0–36.0)
MCV: 100.6 fL — ABNORMAL HIGH (ref 80.0–100.0)
Platelets: 209 10*3/uL (ref 150–400)
RBC: 3.4 MIL/uL — ABNORMAL LOW (ref 3.87–5.11)
RDW: 15.4 % (ref 11.5–15.5)
WBC: 11.2 10*3/uL — ABNORMAL HIGH (ref 4.0–10.5)
nRBC: 0 % (ref 0.0–0.2)

## 2022-11-20 LAB — BASIC METABOLIC PANEL
Anion gap: 11 (ref 5–15)
BUN: 13 mg/dL (ref 8–23)
CO2: 22 mmol/L (ref 22–32)
Calcium: 8.1 mg/dL — ABNORMAL LOW (ref 8.9–10.3)
Chloride: 98 mmol/L (ref 98–111)
Creatinine, Ser: 1.04 mg/dL — ABNORMAL HIGH (ref 0.44–1.00)
GFR, Estimated: 55 mL/min — ABNORMAL LOW (ref >60)
Glucose, Bld: 152 mg/dL — ABNORMAL HIGH (ref 70–99)
Potassium: 3.5 mmol/L (ref 3.5–5.1)
Sodium: 131 mmol/L — ABNORMAL LOW (ref 135–145)

## 2022-11-20 LAB — MAGNESIUM: Magnesium: 2 mg/dL (ref 1.7–2.4)

## 2022-11-20 LAB — SURGICAL PATHOLOGY

## 2022-11-20 NOTE — Progress Notes (Signed)
   11/20/22 0945  TOC Brief Assessment  Insurance and Status Reviewed  Patient has primary care physician Yes  Home environment has been reviewed Resides with spouse  Prior level of function: Independent at baseline  Prior/Current Home Services No current home services  Social Determinants of Health Reivew SDOH reviewed no interventions necessary  Readmission risk has been reviewed Yes  Transition of care needs no transition of care needs at this time

## 2022-11-20 NOTE — Progress Notes (Signed)
11/20/2022  Alexandria [name] [id number] 09-15-1945  CARE TEAM: PCP: Donato Schultz, DO  Outpatient Care Team: Patient Care Team: Zola Button, Grayling Congress, DO as PCP - General (Family Medicine) Thomasene Ripple, DO as PCP - Cardiology (Cardiology) Freddy Finner, MD (Inactive) as Consulting Physician (Obstetrics and Gynecology) Thomasene Ripple, DO as Consulting Physician (Cardiology) Imogene Burn, MD as Consulting Physician (Gastroenterology) Karie Soda, MD as Consulting Physician (General Surgery)  Inpatient Treatment Team: Treatment Team:  Karie Soda, MD Kelby Aline, RN   Problem List:   Principal Problem:   Diverticulitis of large intestine with perforation and abscess Active Problems:   Hypothyroidism   Hx of myocardial infarction   ILD (interstitial lung disease) (HCC)   History of transient ischemic attack (TIA)   IPF (idiopathic pulmonary fibrosis) (HCC)   Essential hypertension   11/19/2022  POST-OPERATIVE DIAGNOSIS:  History of low anterior resection for complex diverticulitis with stricture and abscesses 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  Gently dilated.  Anastomosis well healed.    Moderate adhesions of loop ileostomy to abdominal wall.  No major parastomal hernia.    It is a side-to-side stapled ileoileal anastomosis close to the terminal ileum.   Assessment [facility] Stay = 1 days) 1 Day Post-Op    OK    Plan:  -multimodal pain control -anxiolysis - pt has a lot of issue w pain & other meds.  BZ tend to work better - follow closely BP control -VTE prophylaxis- SCDs, etc -mobilize as tolerated to help recovery -Disposition:  Disposition:  The patient is from: Home Anticipate discharge to:  Home Anticipated Date of Discharge is:  August 20,2024    Barriers to discharge:  Pending Clinical improvement (more likely than not)  Patient currently is NOT MEDICALLY STABLE for discharge from the hospital from a surgery standpoint.      I reviewed nursing notes, last 24 h vitals and pain scores, last 48 h intake and output, last 24 h labs and trends, and last 24 h imaging results.  I have reviewed this patient's available data, including medical history, events of note, test results, etc as part of my evaluation.   A significant portion of that time was spent in counseling. Care during the described time interval was provided by me.  This care required moderate level of medical decision making.  11/20/2022    Subjective: (Chief complaint)  Sore - needing meds Wanting to stay in bed Better after ativan  Objective:  Vital signs:  Vitals:   11/20/22 0022 11/20/22 0030 11/20/22 0500 11/20/22 0522  BP: (!) 89/61 92/64  (!) 101/59  Pulse: 88 88  87  Resp: 18   18  Temp: 98 F (36.7 C)   (!) 97.2 F (36.2 C)  TempSrc:      SpO2: 96%   97%  Weight:   66.3 kg   Height:           Intake/Output   Yesterday:  08/15 0701 - 08/16 0700 In: 1251.2 [P.O.:180; I.V.:971.2; IV Piggyback:100] Out: 1325 [Urine:1300; Blood:25] This shift:  No intake/output data recorded.  Bowel function:  Flatus: No  BM:  No  Drain: (No drain)   Physical Exam:  General: Pt awake/alert in mild acute distress.  Tired not toxic Eyes: PERRL, normal EOM.  Sclera clear.  No icterus Neuro: CN II-XII intact w/o focal sensory/motor deficits. Lymph: No head/neck/groin lymphadenopathy Psych:  No delerium/psychosis/paranoia.  Oriented x 4 HENT: Normocephalic, Mucus membranes moist.  No thrush Neck: Supple, No tracheal deviation.  No obvious thyromegaly Chest: No pain to chest wall compression.  Good respiratory excursion.  No audible wheezing CV:  Pulses intact.  Regular rhythm.  No major extremity edema MS: Normal AROM mjr joints.  No obvious  deformity  Abdomen: Soft.  Nondistended.   TTP at ileostomy old site but somewhat diffuse .  No evidence of peritonitis.  No incarcerated hernias.  Ext:   No deformity.  No mjr edema.  No cyanosis Skin: No petechiae / purpurea.  No major sores.  Warm and dry    Results:   Cultures: No results found for this or any previous visit (from the past 720 hour(s)).  Labs: Results for orders placed or performed during the hospital encounter of 11/19/22 (from the past 48 hour(s))  Basic metabolic panel     Status: Abnormal   Collection Time: 11/20/22  4:12 AM  Result Value Ref Range   Sodium 131 (L) 135 - 145 mmol/L   Potassium 3.5 3.5 - 5.1 mmol/L   Chloride 98 98 - 111 mmol/L   CO2 22 22 - 32 mmol/L   Glucose, Bld 152 (H) 70 - 99 mg/dL    Comment: Glucose reference range applies only to samples taken after fasting for at least 8 hours.   BUN 13 8 - 23 mg/dL   Creatinine, Ser 4.16 (H) 0.44 - 1.00 mg/dL   Calcium 8.1 (L) 8.9 - 10.3 mg/dL   GFR, Estimated 55 (L) >60 mL/min    Comment: (NOTE) Calculated using the CKD-EPI Creatinine Equation (2021)    Anion gap 11 5 - 15    Comment: Performed at [facility], [address] [place], [place] [address]  CBC     Status: Abnormal   Collection Time: 11/20/22  4:12 AM  Result Value Ref Range   WBC 11.2 (H) 4.0 - 10.5 K/uL   RBC 3.40 (L) 3.87 - 5.11 MIL/uL   Hemoglobin 11.1 (L) 12.0 - 15.0 g/dL   HCT 16.0 (L) 10.9 - 32.3 %   MCV 100.6 (H) 80.0 - 100.0 fL   MCH 32.6 26.0 - 34.0 pg   MCHC 32.5 30.0 - 36.0 g/dL   RDW 55.7 32.2 - 02.5 %   Platelets 209 150 - 400 K/uL   nRBC 0.0 0.0 - 0.2 %    Comment: Performed at [facility], [address], [place], [place] [address]  Magnesium     Status: None   Collection Time: 11/20/22  4:12 AM  Result Value Ref Range   Magnesium 2.0 1.7 - 2.4 mg/dL    Comment: Performed at [facility], [address] [place], [place] [address]   *Note:  Due to a large number of results and/or encounters for the requested time period, some results have not been displayed. A complete set of results can be found in Results Review.    Imaging / Studies: No results found.  Medications / Allergies: per chart  Antibiotics: Anti-infectives (From admission, onward)    Start     Dose/Rate Route Frequency Ordered Stop   11/19/22 1945  cefoTEtan (CEFOTAN) 2 g in sodium chloride 0.9 % 100 mL IVPB        2 g 200 mL/hr over 30 Minutes Intravenous Every 12  hours 11/19/22 1138 11/19/22 2044   11/19/22 0600  cefoTEtan (CEFOTAN) 2 g in sodium chloride 0.9 % 100 mL IVPB        2 g 200 mL/hr over 30 Minutes Intravenous On call to O.R. 11/19/22 0542 11/19/22 0742         Note: Portions of this report may have been transcribed using voice recognition software. Every effort was made to ensure accuracy; however, inadvertent computerized transcription errors may be present.   Any transcriptional errors that result from this process are unintentional.    Ardeth Sportsman, MD, FACS, MASCRS Esophageal, Gastrointestinal & Colorectal Surgery Robotic and Minimally Invasive Surgery  [facility] [address], [address] [place], [place] [address] [phone] Fax [phone] Main  CONTACT INFORMATION: Weekday (9AM-5PM): Call [facility] main office at [phone] Weeknight (5PM-9AM) or Weekend/Holiday: Check EPIC "Web Links" tab & use "AMION" (password " [email or web address]") for General Surgery [facility] coverage  Please, DO NOT use SecureChat  (it is not reliable communication to reach operating surgeons & will lead to a delay in care).   Epic staff messaging available for outptient concerns needing 1-2 business day response.      11/20/2022  7:39 AM

## 2022-11-21 NOTE — Progress Notes (Signed)
2 Days Post-Op   Subjective/Chief Complaint: Complains of pain. Had a large bm this am   Objective: Vital signs in last 24 hours: Temp:  [97.1 F (36.2 C)-97.9 F (36.6 C)] 97.8 F (36.6 C) (08/17 0609) Pulse Rate:  [66-86] 66 (08/17 0609) Resp:  [18] 18 (08/17 0609) BP: (102-158)/(64-75) 158/75 (08/17 0609) SpO2:  [94 %-98 %] 95 % (08/17 0609) Weight:  [66.3 kg] 66.3 kg (08/17 0500) Last BM Date : 11/21/22  Intake/Output from previous day: 08/16 0701 - 08/17 0700 In: 780 [P.O.:780] Out: 200 [Urine:200] Intake/Output this shift: No intake/output data recorded.  General appearance: alert and cooperative Resp: clear to auscultation bilaterally Cardio: regular rate and rhythm GI: moderate diffuse tenderness. Good bs. Incision ok  Lab Results:  Recent Labs    11/20/22 0412  WBC 11.2*  HGB 11.1*  HCT 34.2*  PLT 209   BMET Recent Labs    11/20/22 0412  NA 131*  K 3.5  CL 98  CO2 22  GLUCOSE 152*  BUN 13  CREATININE 1.04*  CALCIUM 8.1*   PT/INR No results for input(s): "LABPROT", "INR" in the last 72 hours. ABG No results for input(s): "PHART", "HCO3" in the last 72 hours.  Invalid input(s): "PCO2", "PO2"  Studies/Results: No results found.  Anti-infectives: Anti-infectives (From admission, onward)    Start     Dose/Rate Route Frequency Ordered Stop   11/19/22 1945  cefoTEtan (CEFOTAN) 2 g in sodium chloride 0.9 % 100 mL IVPB        2 g 200 mL/hr over 30 Minutes Intravenous Every 12 hours 11/19/22 1138 11/19/22 2044   11/19/22 0600  cefoTEtan (CEFOTAN) 2 g in sodium chloride 0.9 % 100 mL IVPB        2 g 200 mL/hr over 30 Minutes Intravenous On call to O.R. 11/19/22 0542 11/19/22 0742       Assessment/Plan: s/p Procedure(s): OPEN TAKEDOWN OF LOOP ILEOSTOMY (N/A) ANORECTAL EXAM UNDER ANESTHESIA (N/A) Advance diet Ambulate POD 2 Continue cefotetan Lovenox for vte prophylaxis  LOS: 2 days    Chevis Pretty III 11/21/2022

## 2022-11-21 NOTE — Evaluation (Signed)
Physical Therapy Evaluation and Discharge Patient Details Name: Alexandria [name] MRN: [id number] DOB: Aug 09, 1945 Today's Date: 11/21/2022  History of Present Illness  77 yo female s/p OPEN TAKEDOWN OF LOOP ILEOSTOMY on 11/19/22.   PMH: DVT, hypothyroidsim, chronic hypokalemia, HTN, L TKA, MI  Clinical Impression  Pt admitted with above diagnosis.  Pt doing well today, mildly dizzy initially but recovered after seated rest and continued hallway ambulation. Encouraged pt to continue to mobilize with mobility team or family; no further PT needs.   Pt currently with functional limitations due to the deficits listed below (see PT Problem List). Pt will benefit from acute skilled PT to increase their independence and safety with mobility to allow discharge.           If plan is discharge home, recommend the following: Help with stairs or ramp for entrance;Assist for transportation;Assistance with cooking/housework   Can travel by private vehicle        Equipment Recommendations None recommended by PT  Recommendations for Other Services       Functional Status Assessment Patient has had a recent decline in their functional status and demonstrates the ability to make significant improvements in function in a reasonable and predictable amount of time.     Precautions / Restrictions Precautions Precautions: Fall Restrictions Weight Bearing Restrictions: No      Mobility  Bed Mobility Overal bed mobility: Needs Assistance Bed Mobility: Sidelying to Sit   Sidelying to sit: Supervision       General bed mobility comments: cues for log roll, pt verbalizes understanding from prior surgeries    Transfers Overall transfer level: Needs assistance Equipment used: Rolling walker (2 wheels) Transfers: Sit to/from Stand Sit to Stand: Contact guard assist, Supervision           General transfer comment: from bed, recliner and toilet; supervision to CGA for safety     Ambulation/Gait Ambulation/Gait assistance: Contact guard assist, Supervision Gait Distance (Feet): 70 Feet (15') Assistive device: Rolling walker (2 wheels) Gait Pattern/deviations: Step-through pattern       General Gait Details: steady gait with RW, no LOB; pt amb to bathroom and then c/o mild dizziness which resolved with brief sitting rest, pt able to continue amb  Stairs            Wheelchair Mobility     Tilt Bed    Modified Rankin (Stroke Patients Only)       Balance                                             Pertinent Vitals/Pain Pain Assessment Pain Assessment: Faces Faces Pain Scale: Hurts little more Pain Location: abd Pain Descriptors / Indicators: Grimacing, Sore Pain Intervention(s): Limited activity within patient's tolerance, Monitored during session, Premedicated before session, Repositioned    Home Living Family/patient expects to be discharged to:: Private residence Living Arrangements: Spouse/significant other Available Help at Discharge: Family;Available 24 hours/day Type of Home: House Home Access: Stairs to enter Entrance Stairs-Rails: None Entrance Stairs-Number of Steps: 1 small step Alternate Level Stairs-Number of Steps: flight Home Layout: Able to live on main level with bedroom/bathroom;Two level Home Equipment: Rollator (4 wheels);Cane - quad;Shower Counsellor (2 wheels) Additional Comments: dtr will be staying to assist as needed    Prior Function Prior Level of Function : Independent/Modified Independent  Mobility Comments: walks without AD at baseline       Extremity/Trunk Assessment   Upper Extremity Assessment Upper Extremity Assessment: Overall WFL for tasks assessed    Lower Extremity Assessment Lower Extremity Assessment: Overall WFL for tasks assessed       Communication   Communication Communication: No apparent difficulties  Cognition Arousal:  Alert Behavior During Therapy: WFL for tasks assessed/performed, Flat affect Overall Cognitive Status: Within Functional Limits for tasks assessed                                          General Comments      Exercises     Assessment/Plan    PT Assessment Patient does not need any further PT services  PT Problem List         PT Treatment Interventions      PT Goals (Current goals can be found in the Care Plan section)  Acute Rehab PT Goals PT Goal Formulation: All assessment and education complete, DC therapy Time For Goal Achievement: 12/05/22 Potential to Achieve Goals: Good    Frequency       Co-evaluation               AM-PAC PT "6 Clicks" Mobility  Outcome Measure Help needed turning from your back to your side while in a flat bed without using bedrails?: None Help needed moving from lying on your back to sitting on the side of a flat bed without using bedrails?: None Help needed moving to and from a bed to a chair (including a wheelchair)?: None Help needed standing up from a chair using your arms (e.g., wheelchair or bedside chair)?: None Help needed to walk in hospital room?: None Help needed climbing 3-5 steps with a railing? : A Little 6 Click Score: 23    End of Session Equipment Utilized During Treatment: Gait belt Activity Tolerance: Patient tolerated treatment well Patient left: with call bell/phone within reach;in chair;with family/visitor present   PT Visit Diagnosis: Other abnormalities of gait and mobility (R26.89)    Time: 1610-9604 PT Time Calculation (min) (ACUTE ONLY): 15 min   Charges:   PT Evaluation $PT Eval Low Complexity: 1 Low   PT General Charges $$ ACUTE PT VISIT: 1 Visit         Larenz Frasier, PT  Acute Rehab Dept [facility]) [phone]  11/21/2022   [facility] 11/21/2022, 1:00 PM

## 2022-11-22 NOTE — Progress Notes (Signed)
3 Days Post-Op   Subjective/Chief Complaint: Feels about the same as yesterday. Having loose bm's   Objective: Vital signs in last 24 hours: Temp:  [97.6 F (36.4 C)-98 F (36.7 C)] 98 F (36.7 C) (08/18 0602) Pulse Rate:  [67-70] 67 (08/18 0602) Resp:  [16-18] 18 (08/18 0602) BP: (105-153)/(55-72) 153/68 (08/18 0602) SpO2:  [95 %-97 %] 96 % (08/18 0602) Last BM Date : 11/21/22  Intake/Output from previous day: 08/17 0701 - 08/18 0700 In: 600 [P.O.:600] Out: -  Intake/Output this shift: No intake/output data recorded.  General appearance: alert and cooperative Resp: clear to auscultation bilaterally Cardio: regular rate and rhythm GI: soft, moderate diffuse tenderness. Good bs. Incision ok  Lab Results:  Recent Labs    11/20/22 0412  WBC 11.2*  HGB 11.1*  HCT 34.2*  PLT 209   BMET Recent Labs    11/20/22 0412  NA 131*  K 3.5  CL 98  CO2 22  GLUCOSE 152*  BUN 13  CREATININE 1.04*  CALCIUM 8.1*   PT/INR No results for input(s): "LABPROT", "INR" in the last 72 hours. ABG No results for input(s): "PHART", "HCO3" in the last 72 hours.  Invalid input(s): "PCO2", "PO2"  Studies/Results: No results found.  Anti-infectives: Anti-infectives (From admission, onward)    Start     Dose/Rate Route Frequency Ordered Stop   11/19/22 1945  cefoTEtan (CEFOTAN) 2 g in sodium chloride 0.9 % 100 mL IVPB        2 g 200 mL/hr over 30 Minutes Intravenous Every 12 hours 11/19/22 1138 11/19/22 2044   11/19/22 0600  cefoTEtan (CEFOTAN) 2 g in sodium chloride 0.9 % 100 mL IVPB        2 g 200 mL/hr over 30 Minutes Intravenous On call to O.R. 11/19/22 0542 11/19/22 0742       Assessment/Plan: s/p Procedure(s): OPEN TAKEDOWN OF LOOP ILEOSTOMY (N/A) ANORECTAL EXAM UNDER ANESTHESIA (N/A) Advance diet POD 3 Continue cefotetan until wbc normal Ambulate Lovenox for dvt prophylaxis  LOS: 3 days    Chevis Pretty III 11/22/2022

## 2022-11-22 NOTE — Plan of Care (Signed)
  Problem: Education: Goal: Understanding of discharge needs will improve Outcome: Progressing   Problem: Activity: Goal: Ability to tolerate increased activity will improve Outcome: Progressing   Problem: Pain Managment: Goal: General experience of comfort will improve Outcome: Progressing

## 2022-11-23 MED ORDER — TRAMADOL HCL 50 MG PO TABS: 50.0000 mg | ORAL_TABLET | Freq: Four times a day (QID) | ORAL | 0 refills | Status: DC | PRN

## 2022-11-23 MED ORDER — NAPROXEN 500 MG PO TABS: 500.0000 mg | ORAL_TABLET | Freq: Two times a day (BID) | ORAL | 1 refills | Status: DC | PRN

## 2022-11-23 NOTE — Plan of Care (Signed)
  Problem: Education: Goal: Understanding of discharge needs will improve Outcome: Adequate for Discharge Goal: Verbalization of understanding of the causes of altered bowel function will improve Outcome: Adequate for Discharge   Problem: Activity: Goal: Ability to tolerate increased activity will improve Outcome: Adequate for Discharge   Problem: Bowel/Gastric: Goal: Gastrointestinal status for postoperative course will improve Outcome: Adequate for Discharge   Problem: Health Behavior/Discharge Planning: Goal: Identification of community resources to assist with postoperative recovery needs will improve Outcome: Adequate for Discharge   Problem: Nutritional: Goal: Will attain and maintain optimal nutritional status will improve Outcome: Adequate for Discharge   Problem: Clinical Measurements: Goal: Postoperative complications will be avoided or minimized Outcome: Adequate for Discharge   Problem: Respiratory: Goal: Respiratory status will improve Outcome: Adequate for Discharge   Problem: Skin Integrity: Goal: Will show signs of wound healing Outcome: Adequate for Discharge   Problem: Education: Goal: Knowledge of General Education information will improve Description: Including pain rating scale, medication(s)/side effects and non-pharmacologic comfort measures Outcome: Adequate for Discharge   Problem: Health Behavior/Discharge Planning: Goal: Ability to manage health-related needs will improve Outcome: Adequate for Discharge   Problem: Clinical Measurements: Goal: Ability to maintain clinical measurements within normal limits will improve Outcome: Adequate for Discharge Goal: Will remain free from infection Outcome: Adequate for Discharge Goal: Diagnostic test results will improve Outcome: Adequate for Discharge Goal: Respiratory complications will improve Outcome: Adequate for Discharge Goal: Cardiovascular complication will be avoided Outcome: Adequate for  Discharge   Problem: Activity: Goal: Risk for activity intolerance will decrease Outcome: Adequate for Discharge   Problem: Nutrition: Goal: Adequate nutrition will be maintained Outcome: Adequate for Discharge   Problem: Coping: Goal: Level of anxiety will decrease Outcome: Adequate for Discharge   Problem: Elimination: Goal: Will not experience complications related to bowel motility Outcome: Adequate for Discharge Goal: Will not experience complications related to urinary retention Outcome: Adequate for Discharge   Problem: Pain Managment: Goal: General experience of comfort will improve Outcome: Adequate for Discharge   Problem: Safety: Goal: Ability to remain free from injury will improve Outcome: Adequate for Discharge   Problem: Skin Integrity: Goal: Risk for impaired skin integrity will decrease Outcome: Adequate for Discharge   

## 2022-11-23 NOTE — Discharge Summary (Signed)
Physician Discharge Summary    Alexandria [name] MRN: [id number] DOB/AGE: [birth year] = 77 y.o.  Patient Care Team: Zola Button, Grayling Congress, DO as PCP - General (Family Medicine) Thomasene Ripple, DO as PCP - Cardiology (Cardiology) Freddy Finner, MD (Inactive) as Consulting Physician (Obstetrics and Gynecology) Thomasene Ripple, DO as Consulting Physician (Cardiology) Imogene Burn, MD as Consulting Physician (Gastroenterology) Karie Soda, MD as Consulting Physician (General Surgery)  Admit date: 11/19/2022  Discharge date: 11/23/2022  Hospital Stay = 4 days    Discharge Diagnoses:  Principal Problem:   Diverticulitis of large intestine with perforation and abscess Active Problems:   Hypothyroidism   Hx of myocardial infarction   ILD (interstitial lung disease) (HCC)   History of transient ischemic attack (TIA)   IPF (idiopathic pulmonary fibrosis) (HCC)   Essential hypertension   4 Days Post-Op  11/19/2022  11/19/2022   POST-OPERATIVE DIAGNOSIS:  History of low anterior resection for complex diverticulitis with stricture and abscesses 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  Gently dilated.  Anastomosis well healed.    Moderate adhesions of loop ileostomy to abdominal wall.  No major parastomal hernia.    It is a side-to-side stapled ileoileal anastomosis close to the terminal ileum.   Consults: Case Management / Social Work, Physical Therapy, Pharmacy, and Anesthesia  Hospital Course:   The patient underwent the surgery above.  Postoperatively, the patient gradually mobilized and advanced to a solid diet.  Pain and other symptoms were treated aggressively.    By the time of discharge, the patient was walking well the hallways, eating food, having flatus.  Pain was well-controlled on an oral  medications.  Based on meeting discharge criteria and continuing to recover, I felt it was safe for the patient to be discharged from the hospital to further recover with close followup. Postoperative recommendations were discussed in detail.  They are written as well.  Discharged Condition: good  Discharge Exam: Blood pressure 134/63, pulse 71, temperature 98.4 F (36.9 C), temperature source Oral, resp. rate 16, height 5\' 3"  (1.6 m), weight 62.5 kg, SpO2 97%.  General: Pt awake/alert/oriented x4 in No acute distress Eyes: PERRL, normal EOM.  Sclera clear.  No icterus Neuro: CN II-XII intact w/o focal sensory/motor deficits. Lymph: No head/neck/groin lymphadenopathy Psych:  No delerium/psychosis/paranoia HENT: Normocephalic, Mucus membranes moist.  No thrush Neck: Supple, No tracheal deviation Chest:  No chest wall pain w good excursion CV:  Pulses intact.  Regular rhythm MS: Normal AROM mjr joints.  No obvious deformity Abdomen: Soft.  Nondistended.  Mildly tender at incisions only.  No evidence of peritonitis.  No incarcerated hernias. Ext:  SCDs BLE.  No mjr edema.  No cyanosis Skin: No petechiae / purpura   Disposition:    Follow-up Information     Karie Soda, MD Follow up on 12/14/2022.   Specialties: General Surgery, Colon and Rectal Surgery Contact information: [address] [place] [address] [phone]                     Allergies as of 11/23/2022       Reactions   Prednisone Other (See Comments)   Changed personality    Dilaudid [hydromorphone] Itching, Other (See Comments)   Dilaudid caused marked confusion   Atrovent Hfa [  ipratropium Bromide Hfa] Itching, Other (See Comments)   Pt could not sleep, Insomnia    Cheratussin Ac [guaifenesin-codeine] Other (See Comments)   Headaches    Diltiazem Itching, Other (See Comments)   Makes patient sick. Shakes. GI   Hydrocodone Itching   Influenza Vaccines Other (See Comments)   Pt  reports heart attack after last flu shot   Morphine Itching   Motrin [ibuprofen] Itching, Other (See Comments)   "Gives false reading in blood"   Oxycodone Itching   Codeine Itching, Nausea Only, Other (See Comments)   Pt takes promethazine with codeine at home        Medication List     TAKE these medications    ascorbic acid 500 MG tablet Commonly known as: VITAMIN C Take 500 mg by mouth 2 (two) times a week.   aspirin EC 81 MG tablet Take 81 mg by mouth 3 (three) times a week. Swallow whole.   benzonatate 100 MG capsule Commonly known as: Tessalon Perles Take 1 capsule (100 mg total) by mouth 3 (three) times daily as needed for cough.   diphenoxylate-atropine 2.5-0.025 MG tablet Commonly known as: LOMOTIL Take 1 tablet by mouth 4 (four) times daily.   ferrous sulfate 325 (65 FE) MG tablet Take 1 tablet (325 mg total) by mouth 2 (two) times daily with a meal.   loperamide 2 MG capsule Commonly known as: IMODIUM Take 2 capsules (4 mg total) by mouth every 6 (six) hours as needed for diarrhea or loose stools (Use if >385mL ileostomy output every 6 hours).   methocarbamol 500 MG tablet Commonly known as: ROBAXIN Take 1 tablet (500 mg total) by mouth every 6 (six) hours as needed for muscle spasms.   multivitamin with minerals Tabs tablet Take 1 tablet by mouth once a week.   naproxen 500 MG tablet Commonly known as: NAPROSYN Take 1 tablet (500 mg total) by mouth every 12 (twelve) hours as needed.   Ofev 100 MG Caps Generic drug: Nintedanib Take 100 mg by mouth 2 (two) times daily.   ondansetron 4 MG tablet Commonly known as: ZOFRAN TAKE 1 TABLET BY MOUTH EVERY 8 HOURS AS NEEDED FOR NAUSEA AND VOMITING   potassium chloride SA 20 MEQ tablet Commonly known as: KLOR-CON M Take 20 mEq by mouth daily.   Synthroid 112 MCG tablet Generic drug: levothyroxine TAKE 1 TABLET BY MOUTH EVERY DAY BEFORE BREAKFAST   traMADol 50 MG tablet Commonly known as:  ULTRAM Take 1-2 tablets (50-100 mg total) by mouth every 6 (six) hours as needed for moderate pain.        Significant Diagnostic Studies:  No results found. However, due to the size of the patient record, not all encounters were searched. Please check Results Review for a complete set of results.  No results found.  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 the left ventricular wall motion is normal   Hypothyroidism    IPF (idiopathic pulmonary fibrosis) (HCC) 01/2018   Myocardial infarction (HCC)    hx of  30 years ago   Pancreatitis    TIA (transient ischemic attack)    8 years ago    Past Surgical History:  Procedure Laterality Date   BREAST BIOPSY Left    [facility]   CARDIAC CATHETERIZATION     10/2013   CATARACT EXTRACTION Bilateral 03/22/2018   FLEXIBLE SIGMOIDOSCOPY N/A 08/13/2022   Procedure: FLEXIBLE SIGMOIDOSCOPY;  Surgeon: Karie Soda, MD;  Location: [facility];  Service: General;  Laterality: N/A;   ILEOSTOMY CLOSURE N/A 11/19/2022   Procedure: OPEN TAKEDOWN OF LOOP ILEOSTOMY;  Surgeon: Karie Soda, MD;  Location: [facility];  Service: General;  Laterality: N/A;   LEFT HEART CATHETERIZATION WITH CORONARY ANGIOGRAM N/A 10/25/2013   Procedure: LEFT HEART CATHETERIZATION WITH CORONARY ANGIOGRAM;  Surgeon: Lennette Bihari, MD;  Location: [facility] CATH LAB;  Service: Cardiovascular;  Laterality: N/A;   LYSIS OF ADHESION N/A 08/13/2022   Procedure: LYSIS OF ADHESION, DIVERTING ILEOSTOMY;  Surgeon: Karie Soda, MD;  Location: [facility];  Service: General;  Laterality: N/A;   NECK SURGERY     ACDF x 2       done in [place] more than 25 years, doesn't know levels   TOTAL KNEE ARTHROPLASTY     Bilateral x's 2   VAGINAL HYSTERECTOMY  10/17/1998   Konrad Dolores   VIDEO BRONCHOSCOPY Bilateral 01/24/2018   Procedure: VIDEO BRONCHOSCOPY WITH FLUORO;  Surgeon: Lupita Leash, MD;  Location: [facility] ENDOSCOPY;  Service: Cardiopulmonary;  Laterality: Bilateral;   XI ROBOTIC ASSISTED COLOSTOMY TAKEDOWN N/A 08/13/2022   Procedure: ROBOTIC OSTOMY TAKEDOWN, SMALL BOWEL RESECTION X 2, BILATERAL TAP BLOCK, TISSUE PERFUSSION ASSESMENT VIA FIREFLY, MOBILAZATION OF SPLENIC FLEXURE;  Surgeon: Karie Soda, MD;  Location: [facility];  Service: General;  Laterality: N/A;   XI ROBOTIC ASSISTED LOWER ANTERIOR RESECTION N/A 03/31/2022   Procedure: XI ROBOTIC ASSISTED LOWER ANTERIOR RESECTION;  Surgeon: Karie Soda, MD;  Location: [facility];  Service: General;  Laterality: N/A;    Social History   Socioeconomic History   Marital status: Married    Spouse name: Tasia Catchings   Number of children: 3   Years of education: Jr college   Highest education level: Not on file  Occupational History   Occupation: Leisure centre manager: UNEMPLOYED   Occupation: retired  Tobacco Use   Smoking status: Never   Smokeless tobacco: Never  Vaping Use   Vaping status: Never Used  Substance and Sexual Activity   Alcohol use: No    Alcohol/week: 0.0 standard drinks of alcohol   Drug use: No   Sexual activity: Not Currently  Other Topics Concern   Not on file  Social History Narrative   Lives with husband Tasia Catchings   Caffeine use: coffee (2 cups per day)   Mostly right-handed   Social Determinants of Health   Financial Resource Strain: Not on file  Food Insecurity: Patient Declined (11/20/2022)   Hunger Vital Sign    Worried About Running Out of Food in the Last Year: Patient declined    Ran Out of Food in the Last Year: Patient declined  Transportation Needs: Patient Declined (11/20/2022)   PRAPARE - Scientist, research (physical sciences) (Medical): Patient declined    Lack of Transportation (Non-Medical): Patient declined  Physical Activity: Not on file  Stress: Not on file  Social Connections: Not on file  Intimate Partner Violence: Patient Declined (11/20/2022)   Humiliation, Afraid, Rape, and Kick questionnaire    Fear of Current or Ex-Partner: Patient declined  Emotionally Abused: Patient declined    Physically Abused: Patient declined    Sexually Abused: Patient declined    Family History  Problem Relation Age of Onset   Ovarian cancer Mother    Uterine cancer Mother    Lung cancer Father    HIV Brother        35   Kidney cancer Brother    Lung cancer Brother    Other Brother        Mouth Cancer   Colon cancer Maternal Aunt    Heart disease Maternal Grandmother    Stroke Maternal Grandmother    Hypertension Maternal Grandmother    Diabetes Maternal Grandmother    Arthritis Other    Esophageal cancer Neg Hx    Liver disease Neg Hx    Rectal cancer Neg Hx    Stomach cancer Neg Hx     Current Facility-Administered Medications  Medication Dose Route Frequency Provider Last Rate Last Admin   0.9 %  sodium chloride infusion  250 mL Intravenous PRN Karie Soda, MD       acetaminophen (TYLENOL) tablet 1,000 mg  1,000 mg Oral Trecia Rogers, MD   1,000 mg at 11/23/22 0603   alum & mag hydroxide-simeth (MAALOX/MYLANTA) 200-200-20 MG/5ML suspension 30 mL  30 mL Oral Q6H PRN Karie Soda, MD       ascorbic acid (VITAMIN C) tablet 500 mg  500 mg Oral Once per day on Monday Thursday Karie Soda, MD   500 mg at 11/23/22 0804   aspirin EC tablet 81 mg  81 mg Oral Once per day on Monday Wednesday Friday Karie Soda, MD   81 mg at 11/23/22 0803   diphenhydrAMINE (BENADRYL) 12.5 MG/5ML elixir 12.5 mg  12.5 mg Oral Q6H PRN Karie Soda, MD       Or   diphenhydrAMINE (BENADRYL) injection 12.5 mg  12.5 mg Intravenous Q6H PRN Karie Soda, MD       enoxaparin (LOVENOX) injection 40 mg  40 mg  Subcutaneous Q24H Karie Soda, MD   40 mg at 11/23/22 0804   feeding supplement (ENSURE SURGERY) liquid 237 mL  237 mL Oral BID BM Karie Soda, MD       fentaNYL (SUBLIMAZE) injection 50-100 mcg  50-100 mcg Intravenous Q2H PRN Karie Soda, MD   50 mcg at 11/21/22 2117   haloperidol lactate (HALDOL) injection 2-5 mg  2-5 mg Intravenous Q6H PRN Karie Soda, MD       hydrALAZINE (APRESOLINE) injection 10 mg  10 mg Intravenous Q2H PRN Karie Soda, MD   10 mg at 11/23/22 5784   levothyroxine (SYNTHROID) tablet 112 mcg  112 mcg Oral Q0600 Karie Soda, MD   112 mcg at 11/23/22 0603   LORazepam (ATIVAN) injection 0.5-1 mg  0.5-1 mg Intravenous Q8H PRN Karie Soda, MD   1 mg at 11/19/22 1630   LORazepam (ATIVAN) tablet 0.5-1 mg  0.5-1 mg Oral Q8H PRN Karie Soda, MD       magic mouthwash  15 mL Oral QID PRN Karie Soda, MD       melatonin tablet 3 mg  3 mg Oral QHS PRN Karie Soda, MD       menthol-cetylpyridinium (CEPACOL) lozenge 3 mg  1 lozenge Oral PRN Karie Soda, MD       metoprolol tartrate (LOPRESSOR) injection 5 mg  5 mg Intravenous Q6H PRN Karie Soda, MD       multivitamin with minerals tablet 1 tablet  1 tablet Oral Weekly  Karie Soda, MD   1 tablet at 11/20/22 2130   naphazoline-glycerin (CLEAR EYES REDNESS) ophth solution 1-2 drop  1-2 drop Both Eyes QID PRN Karie Soda, MD       naproxen (NAPROSYN) tablet 500 mg  500 mg Oral BID WC Karie Soda, MD   500 mg at 11/23/22 0803   ondansetron (ZOFRAN) tablet 4 mg  4 mg Oral Q6H PRN Karie Soda, MD   4 mg at 11/21/22 1753   Or   ondansetron (ZOFRAN) injection 4 mg  4 mg Intravenous Q6H PRN Karie Soda, MD   4 mg at 11/20/22 2039   phenol (CHLORASEPTIC) mouth spray 2 spray  2 spray Mouth/Throat PRN Karie Soda, MD       potassium chloride SA (KLOR-CON M) CR tablet 20 mEq  20 mEq Oral Daily Karie Soda, MD   20 mEq at 11/22/22 8657   prochlorperazine (COMPAZINE) tablet 10 mg  10 mg Oral Q6H PRN Karie Soda, MD        Or   prochlorperazine (COMPAZINE) injection 5-10 mg  5-10 mg Intravenous Q6H PRN Karie Soda, MD       simethicone (MYLICON) chewable tablet 40 mg  40 mg Oral Q6H PRN Karie Soda, MD   40 mg at 11/21/22 1943   sodium chloride (OCEAN) 0.65 % nasal spray 1-2 spray  1-2 spray Each Nare Q6H PRN Karie Soda, MD       sodium chloride flush (NS) 0.9 % injection 3 mL  3 mL Intravenous Catha Gosselin, MD   3 mL at 11/22/22 2200   sodium chloride flush (NS) 0.9 % injection 3 mL  3 mL Intravenous PRN Karie Soda, MD       traMADol Janean Sark) tablet 50-100 mg  50-100 mg Oral Q6H PRN Karie Soda, MD   100 mg at 11/23/22 0258     Allergies  Allergen Reactions   Prednisone Other (See Comments)    Changed personality    Dilaudid [Hydromorphone] Itching and Other (See Comments)    Dilaudid caused marked confusion   Atrovent Hfa [Ipratropium Bromide Hfa] Itching and Other (See Comments)    Pt could not sleep, Insomnia    Cheratussin Ac [Guaifenesin-Codeine] Other (See Comments)    Headaches    Diltiazem Itching and Other (See Comments)    Makes patient sick. Shakes. GI   Hydrocodone Itching   Influenza Vaccines Other (See Comments)    Pt reports heart attack after last flu shot   Morphine Itching   Motrin [Ibuprofen] Itching and Other (See Comments)    "Gives false reading in blood"   Oxycodone Itching   Codeine Itching, Nausea Only and Other (See Comments)    Pt takes promethazine with codeine at home    Signed:   Ardeth Sportsman, MD, FACS, MASCRS Esophageal, Gastrointestinal & Colorectal Surgery Robotic and Minimally Invasive Surgery  [facility] [address] [place], [place] [address] [phone] Fax [phone] Main  CONTACT INFORMATION: Weekday (9AM-5PM): Call [facility] main office at [phone] Weeknight (5PM-9AM) or Weekend/Holiday: Check EPIC "Web Links" tab & use "AMION" (password " [email or web address]") for  General Surgery [facility] coverage  Please, DO NOT use SecureChat  (it is not reliable communication to reach operating surgeons & will lead to a delay in care).   Epic staff messaging available for outptient concerns needing 1-2 business day response.      11/23/2022, 8:18 AM

## 2022-11-23 NOTE — Progress Notes (Signed)
Reviewed written discharge instructions with patient. All questions answered. Patient verbalized understanding. Discharged via wheelchair with all belongings... in stable condition. 

## 2022-11-24 ENCOUNTER — Telehealth: Payer: Self-pay | Admitting: *Deleted

## 2022-11-24 ENCOUNTER — Encounter: Payer: Self-pay | Admitting: *Deleted

## 2022-11-24 NOTE — Transitions of Care (Post Inpatient/ED Visit) (Signed)
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 Zola Button, I can't talk any longer.  I have to go to the bathroom.  You don't need to call me back, I am fine") Any questions or concerns?: No  Items Reviewed: Did you receive and understand the discharge instructions provided?: Yes (attempted to review with patient-- she declined- stated she could no longer talk to me) Medications obtained,verified, and reconciled?: Partial Review Completed (Partial medication review completed; confirmed patient obtained/ is taking all newly Rx'd medications as instructed; self-manages medications and denies questions/ concerns around medications today) Reason for Partial Mediation Review: Patient declined- stated she is unable to talk- abruptly hung phone up mid-way through [facility] call Any new allergies since your discharge?: No Dietary orders reviewed?: No (patient declined completing full TOC call) Do you have support at home?: Yes People in Home: spouse Name of Support/Comfort Primary Source: Reports independent in self-care activities; spouse and adult children assists as/ if needed/ indicated  Medications Reviewed Today: Medications Reviewed Today     Reviewed by Michaela Corner, RN  (Registered Nurse) on 11/24/22 at 1401  Med List Status: <None>   Medication Order Taking? Sig Documenting Provider Last Dose Status Informant  aspirin EC 81 MG tablet [id number] No Take 81 mg by mouth 3 (three) times a week. Swallow whole.  Patient not taking: Reported on 11/24/2022   [provider] Not Taking Active Self           Med Note Michaela Corner   Tue Nov 24, 2022  1:51 PM) 11/24/22: Reports during TOC not currently taking  benzonatate (TESSALON PERLES) 100 MG capsule [id number]  Take 1 capsule (100 mg total) by mouth 3 (three) times daily as needed for cough.  Patient not taking: Reported on 11/04/2022   Clayborne Dana, NP  Active Self  diphenoxylate-atropine (LOMOTIL) 2.5-0.025 MG tablet [id number]  Take 1 tablet by mouth 4 (four) times daily. [provider]  Active Self  ferrous sulfate 325 (65 FE) MG tablet [id number]  Take 1 tablet (325 mg total) by mouth 2 (two) times daily with a meal. Karie Soda, MD  Active Self  loperamide (IMODIUM) 2 MG capsule [id number]  Take 2 capsules (4 mg total) by mouth every 6 (six) hours as needed for diarrhea or loose stools (Use if >323mL ileostomy output every 6 hours).  Patient not taking: Reported on 11/04/2022   Karie Soda, MD  Active Self  methocarbamol (ROBAXIN) 500 MG tablet [id number]  Take 1 tablet (500 mg total) by mouth every 6 (six) hours as needed for muscle spasms.  Patient not taking: Reported on 11/04/2022   Donato Schultz, DO  Active Self  Multiple Vitamin (MULTIVITAMIN WITH MINERALS) TABS tablet [id number]  Take 1 tablet by mouth once a week. [provider]  Active Self  naproxen (NAPROSYN) 500 MG tablet  [id number] Yes Take 1 tablet (500 mg total) by mouth every 12 (twelve) hours as needed. Karie Soda, MD Taking Active   OFEV 100 MG CAPS [id number]  Take 100 mg by mouth 2 (two) times daily. [provider]  Active Self  ondansetron (ZOFRAN) 4 MG tablet [id number]  TAKE 1 TABLET BY MOUTH  EVERY 8 HOURS AS NEEDED FOR NAUSEA AND VOMITING Zola Button, Yvonne R, DO  Active   potassium chloride SA (KLOR-CON M) 20 MEQ tablet [id number]  Take 20 mEq by mouth daily. [provider]  Active Self  SYNTHROID 112 MCG tablet [id number]  TAKE 1 TABLET BY MOUTH EVERY DAY BEFORE BREAKFAST Zola Button, Grayling Congress, DO  Active Self  traMADol (ULTRAM) 50 MG tablet [id number] Yes Take 1-2 tablets (50-100 mg total) by mouth every 6 (six) hours as needed for moderate pain. Karie Soda, MD Taking Active   vitamin C (ASCORBIC ACID) 500 MG tablet [id number] Yes Take 500 mg by mouth 2 (two) times a week. [provider] Taking Active Self           Home Care and Equipment/Supplies: Were Home Health Services Ordered?: No Any new equipment or medical supplies ordered?: No  Functional Questionnaire: Do you need assistance with bathing/showering or dressing?:  (patient declined completiung full TOC call) Do you need assistance with meal preparation?:  (patient declined completiung full TOC call) Do you need assistance with eating?:  (patient declined completing full TOC call) Do you have difficulty maintaining continence: No Do you need assistance with getting out of bed/getting out of a chair/moving?:  (patient declined completing full TOC call) Do you have difficulty managing or taking your medications?: No  Follow up appointments reviewed: PCP Follow-up appointment confirmed?: Yes (care coordination outreach in real-time with scheduling care guide to successfully schedule hospital follow up PCP appointment 11/26/22) Date of PCP follow-up appointment?: 11/26/22 Follow-up Provider: PCP Specialist Hospital Follow-up appointment confirmed?: Yes Date of Specialist follow-up appointment?: 12/14/22 (verified this is recommended time frame for follow up per hospital discharging provider notes) Follow-Up Specialty Provider:: surgical provider Do you need transportation to your follow-up  appointment?: No Do you understand care options if your condition(s) worsen?: Yes-patient verbalized understanding  SDOH Interventions Today    Flowsheet Row Most Recent Value  SDOH Interventions   Transportation Interventions Intervention Not Indicated  [reports family to transport post-recent surgery]      TOC Interventions Today    Flowsheet Row Most Recent Value  TOC Interventions   TOC Interventions Discussed/Reviewed TOC Interventions Discussed, Arranged PCP follow up within 7 days/Care Guide scheduled  [Patient declines need for ongoing/ further care coordination outreach,  no care coordination needs identified at time of TOC call today,  offered to call back later today and to provide my direct contact information -patient declined]      Interventions Today    Flowsheet Row Most Recent Value  Chronic Disease   Chronic disease during today's visit Other  [surgery for takedown of loop ileostomy]  General Interventions   General Interventions Discussed/Reviewed General Interventions Discussed, Doctor Visits  Doctor Visits Discussed/Reviewed Doctor Visits Discussed, PCP, Specialist  PCP/Specialist Visits Compliance with follow-up visit  Pharmacy Interventions   Pharmacy Dicussed/Reviewed Pharmacy Topics Discussed      Caryl Pina, RN, BSN, CCRN Alumnus RN CM Care Coordination/ Transition of Care- [facility] Care Management [phone]: direct office

## 2022-11-26 ENCOUNTER — Ambulatory Visit: Payer: Medicare Other | Admitting: Family Medicine

## 2022-11-26 ENCOUNTER — Encounter: Payer: Self-pay | Admitting: Family Medicine

## 2022-11-26 VITALS — BP 122/78 | HR 70 | Temp 98.1°F | Resp 20 | Ht 63.0 in | Wt 139.4 lb

## 2022-11-26 DIAGNOSIS — J84112 Idiopathic pulmonary fibrosis: Secondary | ICD-10-CM | POA: Diagnosis not present

## 2022-11-26 DIAGNOSIS — E039 Hypothyroidism, unspecified: Secondary | ICD-10-CM

## 2022-11-26 MED ORDER — SYNTHROID 112 MCG PO TABS
112.0000 ug | ORAL_TABLET | Freq: Every day | ORAL | 0 refills | Status: DC
Start: 2022-11-26 — End: 2022-12-11

## 2022-11-26 NOTE — Progress Notes (Signed)
+  Established Patient Office Visit  Subjective   Patient ID: Alexandria [name], female    DOB: 03/15/1946  Age: 77 y.o. MRN: [id number]  Chief Complaint  Patient presents with   Surgery Follow up    HPI Discussed the use of AI scribe software for clinical note transcription with the patient, who gave verbal consent to proceed.  History of Present Illness   The patient, with a recent history of surgery, presents with post-operative discomfort and pain at the surgical site. She describes the pain as a burning sensation, particularly noticeable when turning to the left. The pain is severe enough to disrupt her sleep, with the patient reporting only being able to sleep for two to three hours at a time. The patient also reports occasional dizziness upon standing, which she attributes to the lingering effects of anesthesia.  The patient expresses concern about her post-operative diet, as she received unclear instructions from the surgeon. She is unsure about the consumption of certain foods, such as nuts and popcorn. The patient also discusses her bowel movements, which have been soft and diarrhea-like since the surgery. She has not been taking any medication for this, but has been advised to take Imodium for diarrhea and Miralax for constipation as needed.  The patient also mentions a history of wheezing, which has been slightly worse since the surgery. She attributes this to the air outside and potential allergies. The patient is currently managing her pain with Tramadol, taken once a day, primarily at night.      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 11/19/2022   Ileostomy care (HCC) 11/18/2022   Diaper candidiasis 09/28/2022   Ileostomy, has currently (HCC) 09/28/2022   At risk for dehydration 09/16/2022   At high risk for dehydration 09/14/2022   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   Dehydration 08/26/2022   Irritant contact dermatitis associated with digestive stoma 08/26/2022   Ileostomy in place [facility]) 08/26/2022   High output ileostomy (HCC) 08/25/2022   Diastolic dysfunction 08/14/2022   Chronic facial pain 08/14/2022   Dyspepsia 08/14/2022   Blowout of rectal stump (HCC) 08/14/2022   Diverticulitis of colon 08/13/2022   Colostomy care (HCC) 07/31/2022   Bronchospasm 06/16/2022   Temporal arteritis (HCC) 06/16/2022   Chronic obstructive pulmonary disease with (acute) lower respiratory infection (HCC) 06/16/2022   Angina pectoris with documented spasm (HCC) 06/16/2022   Abdominal aortic aneurysm (AAA) without rupture (HCC) 06/16/2022   Right ankle sprain 06/09/2022   Sprain of right ankle 06/01/2022   Dizziness 06/01/2022   Rectal abscess 06/01/2022   Acute right ankle pain 05/19/2022   History of low anterior resection of rectum 05/04/2022  Irritant contact dermatitis associated with fecal stoma 05/02/2022   Slow transit constipation 05/02/2022   Colostomy complication (HCC) 05/02/2022   IDA (iron deficiency anemia) 04/19/2022   Intra-abdominal abscess (HCC) 04/18/2022   Acute blood loss anemia 04/17/2022   Chronic diastolic CHF (congestive heart failure) (HCC) 04/17/2022   Toxic metabolic encephalopathy 04/17/2022   Ileus following gastrointestinal surgery (HCC) 04/17/2022   Pelvic abscess in female 03/30/2022   Flat foot 11/19/2021   S/P total knee arthroplasty, left 11/13/2021   Ankle impingement syndrome, left 11/13/2021   Rectal bleeding 08/22/2021   Abnormal thyroid blood test 08/22/2021   Abnormal kidney function 08/22/2021   Dumping syndrome 03/13/2020   Arthritis    Bronchial pneumonia     Colon polyp    DVT (deep venous thrombosis) (HCC)    Heart murmur    Migraines    Pancreatitis    Thyroid disease    Aortic root dilatation (HCC) 11/24/2019   Leukocytosis 11/06/2019   Generalized abdominal pain 11/06/2019   Pre-ulcerative corn or callous 09/07/2019   Nausea 09/07/2019   Pain of left calf 05/09/2019   Tibial pain 05/09/2019   Essential hypertension 02/08/2019   Healthcare maintenance 11/10/2018   Bradycardia 07/22/2018   Pansinusitis 06/23/2018   History of transient ischemic attack (TIA) 03/24/2018   IPF (idiopathic pulmonary fibrosis) (HCC) 03/24/2018   Chronic rhinitis 02/16/2018   Eustachian tube dysfunction, bilateral 02/16/2018   URI (upper respiratory infection) 02/16/2018   Seasonal allergic rhinitis due to pollen 02/16/2018   ILD (interstitial lung disease) (HCC) 01/07/2018   Dyspnea 01/07/2018   Acute respiratory failure (HCC) 01/06/2018   TIA (transient ischemic attack) 01/06/2018   Hypokalemia 12/15/2017   Chronic cough 11/26/2017   Basal cell carcinoma (BCC) of neck 06/07/2017   Anisometropia 06/02/2017   Drusen of macula of both eyes 06/02/2017   Photopsia of left eye 06/02/2017   Cardiac murmur 05/07/2017   Situational anxiety 05/07/2017   Headache 05/07/2017   Mitral and aortic insufficiency 05/07/2017   Migraine without status migrainosus, not intractable 06/09/2016   Insomnia 11/18/2015   Normal coronary arteries 05/20/2015   RBBB 05/20/2015   Abnormal CT of the chest 05/20/2015   Hx of myocardial infarction 05/08/2015   Bruising 05/08/2015   Upper airway cough syndrome 02/14/2015   Asthmatic bronchitis with acute exacerbation 02/06/2015   Mild intermittent asthma without complication 02/06/2015   Oliguria 12/28/2014   Hypothyroidism 10/25/2014   Fatigue 10/25/2014   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   Chronic venous insufficiency 10/24/2014   Varicose veins of leg with complications 10/24/2014   Degeneration of intervertebral  disc of cervical region 06/29/2014   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   Generalized osteoarthritis of multiple sites 06/29/2014   History of stroke without residual deficits 06/29/2014   Palpitations 11/08/2013   Edema of lower extremity 10/03/2013   Hair loss 11/25/2012   Alopecia 11/25/2012   Diverticulitis of large intestine with perforation and abscess 07/14/2012   Hammer toe 06/13/2012   History of stress test 05/21/2011   Hx of echocardiogram 01/27/2010   H/O cardiac catheterization 2004   Past Medical History:  Diagnosis Date   Arthritis    Bronchial pneumonia    Chronic facial pain 08/14/2022   Colon polyp    Diverticulitis    DVT (deep venous thrombosis) (HCC)    lower extremity   H/O cardiac catheterization 2004   Normal coronary arteries   Heart murmur    History of stress test 05/21/2011   Hx  of echocardiogram 01/27/2010   Normal Ef 55% the transmitral spectral doppler flow pattern is normal for age. the left ventricular wall motion is normal   Hypothyroidism    IPF (idiopathic pulmonary fibrosis) (HCC) 01/2018   Myocardial infarction (HCC)    hx of 30 years ago   Pancreatitis    TIA (transient ischemic attack)    8 years ago   Past Surgical History:  Procedure Laterality Date   BREAST BIOPSY Left    [facility]   CARDIAC CATHETERIZATION     10/2013   CATARACT EXTRACTION Bilateral 03/22/2018   FLEXIBLE SIGMOIDOSCOPY N/A 08/13/2022   Procedure: FLEXIBLE SIGMOIDOSCOPY;  Surgeon: Karie Soda, MD;  Location: [facility];  Service: General;  Laterality: N/A;   ILEOSTOMY CLOSURE N/A 11/19/2022   Procedure: OPEN TAKEDOWN OF LOOP ILEOSTOMY;  Surgeon: Karie Soda, MD;  Location: [facility];  Service: General;  Laterality: N/A;   LEFT HEART CATHETERIZATION WITH CORONARY ANGIOGRAM N/A 10/25/2013   Procedure: LEFT HEART CATHETERIZATION WITH CORONARY ANGIOGRAM;  Surgeon: Lennette Bihari, MD;  Location: [facility] CATH LAB;  Service: Cardiovascular;  Laterality:  N/A;   LYSIS OF ADHESION N/A 08/13/2022   Procedure: LYSIS OF ADHESION, DIVERTING ILEOSTOMY;  Surgeon: Karie Soda, MD;  Location: [facility];  Service: General;  Laterality: N/A;   NECK SURGERY     ACDF x 2      done in [place] more than 25 years, doesn't know levels   TOTAL KNEE ARTHROPLASTY     Bilateral x's 2   VAGINAL HYSTERECTOMY  10/17/1998   Konrad Dolores   VIDEO BRONCHOSCOPY Bilateral 01/24/2018   Procedure: VIDEO BRONCHOSCOPY WITH FLUORO;  Surgeon: Lupita Leash, MD;  Location: [facility] ENDOSCOPY;  Service: Cardiopulmonary;  Laterality: Bilateral;   XI ROBOTIC ASSISTED COLOSTOMY TAKEDOWN N/A 08/13/2022   Procedure: ROBOTIC OSTOMY TAKEDOWN, SMALL BOWEL RESECTION X 2, BILATERAL TAP BLOCK, TISSUE PERFUSSION ASSESMENT VIA FIREFLY, MOBILAZATION OF SPLENIC FLEXURE;  Surgeon: Karie Soda, MD;  Location: [facility];  Service: General;  Laterality: N/A;   XI ROBOTIC ASSISTED LOWER ANTERIOR RESECTION N/A 03/31/2022   Procedure: XI ROBOTIC ASSISTED LOWER ANTERIOR RESECTION;  Surgeon: Karie Soda, MD;  Location: [facility];  Service: General;  Laterality: N/A;   Social History   Tobacco Use   Smoking status: Never   Smokeless tobacco: Never  Vaping Use   Vaping status: Never Used  Substance Use Topics   Alcohol use: No    Alcohol/week: 0.0 standard drinks of alcohol   Drug use: No   Social History   Socioeconomic History   Marital status: Married    Spouse name: Tasia Catchings   Number of children: 3   Years of education: Jr college   Highest education level: Not on file  Occupational History   Occupation: Leisure centre manager: UNEMPLOYED   Occupation: retired  Tobacco Use   Smoking status: Never   Smokeless tobacco: Never  Vaping Use   Vaping status: Never Used  Substance and Sexual Activity   Alcohol use: No    Alcohol/week: 0.0 standard drinks of alcohol   Drug use: No   Sexual activity: Not Currently  Other Topics Concern   Not on file  Social History Narrative   Lives with husband  Tasia Catchings   Caffeine use: coffee (2 cups per day)   Mostly right-handed   Social Determinants of Health   Financial Resource Strain: Not on file  Food Insecurity: Patient Declined (11/20/2022)   Hunger Vital Sign    Worried  About Running Out of Food in the Last Year: Patient declined    Ran Out of Food in the Last Year: Patient declined  Transportation Needs: No Transportation Needs (11/24/2022)   PRAPARE - Administrator, Civil Service (Medical): No    Lack of Transportation (Non-Medical): No  Physical Activity: Not on file  Stress: Not on file  Social Connections: Not on file  Intimate Partner Violence: Patient Declined (11/20/2022)   Humiliation, Afraid, Rape, and Kick questionnaire    Fear of Current or Ex-Partner: Patient declined    Emotionally Abused: Patient declined    Physically Abused: Patient declined    Sexually Abused: Patient declined   Family Status  Relation Name Status   Mother  Deceased   Father  Deceased   Brother  (Not Specified)   Brother  (Not Specified)   Brother  (Not Specified)   Brother  (Not Specified)   Brother  (Not Specified)   Mat Aunt  (Not Specified)   MGM  (Not Specified)   Other  (Not Specified)   Neg Hx  (Not Specified)  No partnership data on file   Family History  Problem Relation Age of Onset   Ovarian cancer Mother    Uterine cancer Mother    Lung cancer Father    HIV Brother        1982   Kidney cancer Brother    Lung cancer Brother    Other Brother        Mouth Cancer   Colon cancer Maternal Aunt    Heart disease Maternal Grandmother    Stroke Maternal Grandmother    Hypertension Maternal Grandmother    Diabetes Maternal Grandmother    Arthritis Other    Esophageal cancer Neg Hx    Liver disease Neg Hx    Rectal cancer Neg Hx    Stomach cancer Neg Hx    Allergies  Allergen Reactions   Prednisone Other (See Comments)    Changed personality    Dilaudid [Hydromorphone] Itching and Other (See Comments)     Dilaudid caused marked confusion   Atrovent Hfa [Ipratropium Bromide Hfa] Itching and Other (See Comments)    Pt could not sleep, Insomnia    Cheratussin Ac [Guaifenesin-Codeine] Other (See Comments)    Headaches    Diltiazem Itching and Other (See Comments)    Makes patient sick. Shakes. GI   Hydrocodone Itching   Influenza Vaccines Other (See Comments)    Pt reports heart attack after last flu shot   Morphine Itching   Motrin [Ibuprofen] Itching and Other (See Comments)    "Gives false reading in blood"   Oxycodone Itching   Codeine Itching, Nausea Only and Other (See Comments)    Pt takes promethazine with codeine at home      Review of Systems  Constitutional:  Negative for chills, fever and malaise/fatigue.  HENT:  Negative for congestion and hearing loss.   Eyes:  Negative for blurred vision and discharge.  Respiratory:  Negative for cough, sputum production and shortness of breath.   Cardiovascular:  Negative for chest pain, palpitations and leg swelling.  Gastrointestinal:  Negative for abdominal pain, blood in stool, constipation, diarrhea, heartburn, nausea and vomiting.  Genitourinary:  Negative for dysuria, frequency, hematuria and urgency.  Musculoskeletal:  Negative for back pain, falls and myalgias.  Skin:  Negative for rash.  Neurological:  Negative for dizziness, sensory change, loss of consciousness, weakness and headaches.  Endo/Heme/Allergies:  Negative for environmental allergies.  Does not bruise/bleed easily.  Psychiatric/Behavioral:  Negative for depression and suicidal ideas. The patient is not nervous/anxious and does not have insomnia.       Objective:     BP 122/78 (BP Location: Left Arm, Patient Position: Sitting, Cuff Size: Normal)   Pulse 70   Temp 98.1 F (36.7 C) (Oral)   Resp 20   Ht 5\' 3"  (1.6 m)   Wt 139 lb 6.4 oz (63.2 kg)   SpO2 94%   BMI 24.69 kg/m  BP Readings from Last 3 Encounters:  11/26/22 122/78  11/23/22 134/63  11/16/22  (!) 182/91   Wt Readings from Last 3 Encounters:  11/26/22 139 lb 6.4 oz (63.2 kg)  11/23/22 137 lb 12.6 oz (62.5 kg)  11/11/22 138 lb (62.6 kg)   SpO2 Readings from Last 3 Encounters:  11/26/22 94%  11/23/22 97%  11/16/22 92%      Physical Exam Vitals and nursing note reviewed.  Constitutional:      General: She is not in acute distress.    Appearance: Normal appearance. She is well-developed.  HENT:     Head: Normocephalic and atraumatic.  Eyes:     General: No scleral icterus.       Right eye: No discharge.        Left eye: No discharge.  Cardiovascular:     Rate and Rhythm: Normal rate and regular rhythm.     Heart sounds: No murmur heard. Pulmonary:     Effort: Pulmonary effort is normal. No respiratory distress.     Breath sounds: Normal breath sounds.  Abdominal:     Tenderness: There is abdominal tenderness.  Musculoskeletal:        General: Normal range of motion.     Cervical back: Normal range of motion and neck supple.     Right lower leg: No edema.     Left lower leg: No edema.  Skin:    General: Skin is warm and dry.  Neurological:     Mental Status: She is alert and oriented to person, place, and time.  Psychiatric:        Mood and Affect: Mood normal.        Behavior: Behavior normal.        Thought Content: Thought content normal.        Judgment: Judgment normal.      No results found for any visits on 11/26/22.  Last CBC Lab Results  Component Value Date   WBC 11.2 (H) 11/20/2022   HGB 11.1 (L) 11/20/2022   HCT 34.2 (L) 11/20/2022   MCV 100.6 (H) 11/20/2022   MCH 32.6 11/20/2022   RDW 15.4 11/20/2022   PLT 209 11/20/2022   Last metabolic panel Lab Results  Component Value Date   GLUCOSE 152 (H) 11/20/2022   NA 131 (L) 11/20/2022   K 3.5 11/20/2022   CL 98 11/20/2022   CO2 22 11/20/2022   BUN 13 11/20/2022   CREATININE 1.04 (H) 11/20/2022   GFRNONAA 55 (L) 11/20/2022   CALCIUM 8.1 (L) 11/20/2022   PHOS 2.4 (L) 04/21/2022    PROT 8.5 (H) 08/28/2022   ALBUMIN 3.9 08/28/2022   BILITOT 0.4 08/28/2022   ALKPHOS 64 08/28/2022   AST 21 08/28/2022   ALT 10 08/28/2022   ANIONGAP 11 11/20/2022   Last lipids Lab Results  Component Value Date   CHOL 183 06/15/2022   HDL 42 06/15/2022   LDLCALC 123 (H) 06/15/2022   TRIG 98 06/15/2022  CHOLHDL 4.4 06/15/2022   Last hemoglobin A1c Lab Results  Component Value Date   HGBA1C 5.4 04/05/2022   Last thyroid functions Lab Results  Component Value Date   TSH 0.99 01/02/2022   T4TOTAL 11.0 08/22/2021   Last vitamin D Lab Results  Component Value Date   VD25OH 29.9 (L) 05/09/2019   Last vitamin B12 and Folate Lab Results  Component Value Date   VITAMINB12 640 04/18/2022   FOLATE 10.8 04/18/2022      The ASCVD Risk score (Arnett DK, et al., 2019) failed to calculate for the following reasons:   The patient has a prior MI or stroke diagnosis    Assessment & Plan:   Problem List Items Addressed This Visit       Unprioritized   IPF (idiopathic pulmonary fibrosis) (HCC) - Primary   Relevant Orders   CBC with Differential/Platelet   Comprehensive metabolic panel   Lipid panel   TSH   Hypothyroidism   Relevant Medications   SYNTHROID 112 MCG tablet   Other Relevant Orders   CBC with Differential/Platelet   Comprehensive metabolic panel   Lipid panel   TSH   Assessment and Plan    Postoperative Recovery Recent surgery with removal of ostomy bag. Patient reports pain and difficulty sleeping due to pain. Patient also reports occasional dizziness. -Continue Tramadol as needed for pain. -Consider body pillow for comfort during sleep. -Monitor for persistent dizziness and consider evaluation if it continues.  Bowel Function Patient reports soft stools. Unclear instructions regarding use of Imodium and Miralax. -Use Imodium for diarrhea. -Use Miralax for constipation.  Diet Post-Surgery Unclear dietary restrictions post-surgery. Patient to  clarify with surgeon's office.  Wound Care Patient self-managing wound care with non-stick bandages. Wound appears to be healing well on examination. -Continue current wound care regimen.  General Health Maintenance -Order blood work to monitor postoperative recovery. -Refill Synthroid as needed. -Continue regular follow-up with dermatologist.  Follow-up -Next appointment with surgeon on 12/14/2022. -Consider follow-up sooner if pain, dizziness, or other concerning symptoms persist.       No follow-ups on file.    Donato Schultz, DO

## 2022-11-27 LAB — LIPID PANEL
Cholesterol: 162 mg/dL (ref 0–200)
HDL: 47.2 mg/dL (ref >39.00)
LDL Cholesterol: 96 mg/dL (ref 0–99)
NonHDL: 114.81
Total CHOL/HDL Ratio: 3
Triglycerides: 94 mg/dL (ref 0.0–149.0)
VLDL: 18.8 mg/dL (ref 0.0–40.0)

## 2022-11-27 LAB — CBC WITH DIFFERENTIAL/PLATELET
Basophils Absolute: 0.1 10*3/uL (ref 0.0–0.1)
Basophils Relative: 0.8 % (ref 0.0–3.0)
Eosinophils Absolute: 0.4 10*3/uL (ref 0.0–0.7)
Eosinophils Relative: 5.2 % — ABNORMAL HIGH (ref 0.0–5.0)
HCT: 26.8 % — ABNORMAL LOW (ref 36.0–46.0)
Hemoglobin: 8.7 g/dL — ABNORMAL LOW (ref 12.0–15.0)
Lymphocytes Relative: 14 % (ref 12.0–46.0)
Lymphs Abs: 1.1 10*3/uL (ref 0.7–4.0)
MCHC: 32.5 g/dL (ref 30.0–36.0)
MCV: 99.9 fl (ref 78.0–100.0)
Monocytes Absolute: 1.4 10*3/uL — ABNORMAL HIGH (ref 0.1–1.0)
Monocytes Relative: 17.6 % — ABNORMAL HIGH (ref 3.0–12.0)
Neutro Abs: 4.9 10*3/uL (ref 1.4–7.7)
Neutrophils Relative %: 62.4 % (ref 43.0–77.0)
Platelets: 347 10*3/uL (ref 150.0–400.0)
RBC: 2.68 Mil/uL — ABNORMAL LOW (ref 3.87–5.11)
RDW: 16.2 % — ABNORMAL HIGH (ref 11.5–15.5)
WBC: 7.8 10*3/uL (ref 4.0–10.5)

## 2022-11-27 LAB — COMPREHENSIVE METABOLIC PANEL
ALT: 10 U/L (ref 0–35)
AST: 20 U/L (ref 0–37)
Albumin: 3.7 g/dL (ref 3.5–5.2)
Alkaline Phosphatase: 58 U/L (ref 39–117)
BUN: 13 mg/dL (ref 6–23)
CO2: 30 meq/L (ref 19–32)
Calcium: 8.9 mg/dL (ref 8.4–10.5)
Chloride: 96 mEq/L (ref 96–112)
Creatinine, Ser: 0.87 mg/dL (ref 0.40–1.20)
GFR: 64.03 mL/min (ref >60.00)
Glucose, Bld: 96 mg/dL (ref 70–99)
Potassium: 4.1 mEq/L (ref 3.5–5.1)
Sodium: 134 meq/L — ABNORMAL LOW (ref 135–145)
Total Bilirubin: 0.9 mg/dL (ref 0.2–1.2)
Total Protein: 7.1 g/dL (ref 6.0–8.3)

## 2022-11-27 LAB — TSH: TSH: 34.34 u[IU]/mL — ABNORMAL HIGH (ref 0.35–5.50)

## 2022-11-30 ENCOUNTER — Other Ambulatory Visit: Payer: Self-pay | Admitting: Family Medicine

## 2022-11-30 DIAGNOSIS — D649 Anemia, unspecified: Secondary | ICD-10-CM

## 2022-12-01 ENCOUNTER — Other Ambulatory Visit (INDEPENDENT_AMBULATORY_CARE_PROVIDER_SITE_OTHER): Payer: Medicare Other

## 2022-12-01 DIAGNOSIS — D649 Anemia, unspecified: Secondary | ICD-10-CM | POA: Diagnosis not present

## 2022-12-01 LAB — CBC WITH DIFFERENTIAL/PLATELET
Basophils Absolute: 0.1 10*3/uL (ref 0.0–0.1)
Basophils Relative: 1.3 % (ref 0.0–3.0)
Eosinophils Absolute: 0.5 10*3/uL (ref 0.0–0.7)
Eosinophils Relative: 6.6 % — ABNORMAL HIGH (ref 0.0–5.0)
HCT: 31.1 % — ABNORMAL LOW (ref 36.0–46.0)
Hemoglobin: 10.3 g/dL — ABNORMAL LOW (ref 12.0–15.0)
Lymphocytes Relative: 14.6 % (ref 12.0–46.0)
Lymphs Abs: 1 10*3/uL (ref 0.7–4.0)
MCHC: 33.2 g/dL (ref 30.0–36.0)
MCV: 98.1 fl (ref 78.0–100.0)
Monocytes Absolute: 0.9 10*3/uL (ref 0.1–1.0)
Monocytes Relative: 12.6 % — ABNORMAL HIGH (ref 3.0–12.0)
Neutro Abs: 4.6 10*3/uL (ref 1.4–7.7)
Neutrophils Relative %: 64.9 % (ref 43.0–77.0)
Platelets: 515 10*3/uL — ABNORMAL HIGH (ref 150.0–400.0)
RBC: 3.17 Mil/uL — ABNORMAL LOW (ref 3.87–5.11)
RDW: 16.5 % — ABNORMAL HIGH (ref 11.5–15.5)
WBC: 7.1 10*3/uL (ref 4.0–10.5)

## 2022-12-02 ENCOUNTER — Telehealth: Payer: Self-pay | Admitting: Family Medicine

## 2022-12-02 ENCOUNTER — Telehealth: Payer: Self-pay

## 2022-12-02 LAB — IRON,TIBC AND FERRITIN PANEL
%SAT: 19 % (ref 16–45)
Ferritin: 146 ng/mL (ref 16–288)
Iron: 51 ug/dL (ref 45–160)
TIBC: 264 ug/dL (ref 250–450)

## 2022-12-02 NOTE — Progress Notes (Signed)
Has an appointment on Friday 8/30

## 2022-12-02 NOTE — Telephone Encounter (Signed)
Called pt was advised she will need to seen for  Reevaluating after talking with kim. Appointment was scheduled for Friday and pt was advised if her symptoms  Worsen to [place] back or follow up at ER. Pt stated understand.

## 2022-12-02 NOTE — Telephone Encounter (Signed)
Pt called and states she get dizzy every time she stands up and was not sure if this was related to her anemia or her recent surgery. Transferred to triage.

## 2022-12-02 NOTE — Progress Notes (Signed)
Patient notified

## 2022-12-02 NOTE — Telephone Encounter (Signed)
See telephone note w/ Shamaine.

## 2022-12-02 NOTE — Telephone Encounter (Signed)
Caller Name Maryori Stamour Caller Phone Number [phone] Patient Name Alexandria [name] Patient DOB [birth year] Call Type Message Only Information Provided Reason for Call Request for Lab/Test Results Initial Comment Patient is experiencing dizziness, and has questions about her blood work, refused to speak with a nurse. Disp. Time Disposition Final User 12/02/2022 10:55:41 AM General Information Provided Yes Donnetta Hutching Call Closed By: Donnetta Hutching Transaction Date/Time: 12/02/2022 10:53:27 AM (ET)

## 2022-12-04 ENCOUNTER — Encounter: Payer: Self-pay | Admitting: Family Medicine

## 2022-12-04 ENCOUNTER — Ambulatory Visit (INDEPENDENT_AMBULATORY_CARE_PROVIDER_SITE_OTHER): Payer: Medicare Other | Admitting: Family Medicine

## 2022-12-04 ENCOUNTER — Other Ambulatory Visit (INDEPENDENT_AMBULATORY_CARE_PROVIDER_SITE_OTHER): Payer: Medicare Other

## 2022-12-04 VITALS — BP 140/80 | HR 86 | Temp 98.4°F | Resp 18 | Ht 63.0 in | Wt 139.2 lb

## 2022-12-04 DIAGNOSIS — J0140 Acute pansinusitis, unspecified: Secondary | ICD-10-CM | POA: Diagnosis not present

## 2022-12-04 DIAGNOSIS — D229 Melanocytic nevi, unspecified: Secondary | ICD-10-CM | POA: Diagnosis not present

## 2022-12-04 DIAGNOSIS — E039 Hypothyroidism, unspecified: Secondary | ICD-10-CM

## 2022-12-04 DIAGNOSIS — G43809 Other migraine, not intractable, without status migrainosus: Secondary | ICD-10-CM | POA: Diagnosis not present

## 2022-12-04 DIAGNOSIS — D649 Anemia, unspecified: Secondary | ICD-10-CM

## 2022-12-04 DIAGNOSIS — J84112 Idiopathic pulmonary fibrosis: Secondary | ICD-10-CM

## 2022-12-04 MED ORDER — KETOROLAC TROMETHAMINE 60 MG/2ML IM SOLN
60.0000 mg | Freq: Once | INTRAMUSCULAR | Status: AC
Start: 2022-12-04 — End: 2022-12-04
  Administered 2022-12-04: 60 mg via INTRAMUSCULAR

## 2022-12-04 MED ORDER — FLUTICASONE PROPIONATE 50 MCG/ACT NA SUSP: 2.0000 | Freq: Every day | NASAL | 6 refills | Status: DC

## 2022-12-04 MED ORDER — AMOXICILLIN-POT CLAVULANATE 875-125 MG PO TABS
1.0000 | ORAL_TABLET | Freq: Two times a day (BID) | ORAL | 0 refills | Status: AC
Start: 2022-12-04 — End: ?

## 2022-12-04 NOTE — Addendum Note (Signed)
Addended by: Dorris Fetch on: 12/04/2022 03:04 PM   Modules accepted: Orders

## 2022-12-04 NOTE — Addendum Note (Signed)
Addended by: Dorris Fetch on: 12/04/2022 02:59 PM   Modules accepted: Orders

## 2022-12-04 NOTE — Progress Notes (Signed)
Established Patient Office Visit  Subjective   Patient ID: Alexandria [name], female    DOB: 1945/07/02  Age: 77 y.o. MRN: [id number]  Chief Complaint  Patient presents with   Dizziness   Follow-up    HPI Discussed the use of AI scribe software for clinical note transcription with the patient, who gave verbal consent to proceed.  History of Present Illness   The patient, with a history of anemia, presents with diarrhea and a severe headache. The diarrhea has been ongoing, leading to the use of Imodium. The patient's diet is high in sugar and low in fiber, consisting of ice cream, Coca Cola, Starbucks coffee, and grits. The patient's family member suggests that the diarrhea may be due to the high sugar content in the diet and the lack of fiber. The patient also reports a severe headache, described as a sinus headache, with pain across the top of the head and congestion on the left side.  The patient also reports feeling faint upon standing and has been taking Liquid IV to combat dehydration. The patient's family member suggests that the patient needs to drink more water. The patient also reports not taking some of her prescribed medications, including potassium and Synthroid, which she has started taking again recently. The patient's family member suggests that the cessation of these medications may have contributed to the diarrhea.  The patient also reports a skin lesion that has been present for several months and is not healing. The patient has been advised to see a dermatologist for this lesion. The patient also reports being anemic, with a recent hemoglobin level of 10.3.      Patient Active Problem List   Diagnosis Date Noted   Episodic tension-type headache, not intractable 11/19/2022   Old myocardial infarction 11/19/2022   Postthrombotic syndrome 11/19/2022   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   Irritable bowel syndrome without diarrhea 11/19/2022   Ileostomy care (HCC)  11/18/2022   Diaper candidiasis 09/28/2022   Ileostomy, has currently (HCC) 09/28/2022   At risk for dehydration 09/16/2022   At high risk for dehydration 09/14/2022   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   Dehydration 08/26/2022   Irritant contact dermatitis associated with digestive stoma 08/26/2022   Ileostomy in place [facility]) 08/26/2022   High output ileostomy (HCC) 08/25/2022   Diastolic dysfunction 08/14/2022   Chronic facial pain 08/14/2022   Dyspepsia 08/14/2022   Blowout of rectal stump (HCC) 08/14/2022   Diverticulitis of colon 08/13/2022   Colostomy care (HCC) 07/31/2022   Bronchospasm 06/16/2022   Temporal arteritis (HCC) 06/16/2022   Chronic obstructive pulmonary disease with (acute) lower respiratory infection (HCC) 06/16/2022   Angina pectoris with documented spasm (HCC) 06/16/2022   Abdominal aortic aneurysm (AAA) without rupture (HCC) 06/16/2022   Right ankle sprain 06/09/2022   Sprain of right ankle 06/01/2022   Dizziness 06/01/2022   Rectal abscess 06/01/2022   Acute right ankle pain 05/19/2022   History of low anterior resection of rectum 05/04/2022   Irritant contact dermatitis associated with fecal stoma 05/02/2022   Slow transit constipation 05/02/2022   Colostomy complication (HCC) 05/02/2022   IDA (iron deficiency anemia) 04/19/2022   Intra-abdominal abscess (HCC) 04/18/2022   Acute blood loss anemia 04/17/2022   Chronic diastolic CHF (congestive heart failure) (HCC) 04/17/2022   Toxic metabolic encephalopathy 04/17/2022   Ileus following gastrointestinal surgery (HCC) 04/17/2022   Pelvic abscess in female 03/30/2022   Flat foot 11/19/2021   S/P total knee arthroplasty, left 11/13/2021   Ankle  impingement syndrome, left 11/13/2021   Rectal bleeding 08/22/2021   Abnormal thyroid blood test 08/22/2021   Abnormal kidney function 08/22/2021   Dumping syndrome 03/13/2020   Arthritis    Bronchial pneumonia    Colon polyp    DVT (deep venous thrombosis)  (HCC)    Heart murmur    Migraines    Pancreatitis    Thyroid disease    Aortic root dilatation (HCC) 11/24/2019   Leukocytosis 11/06/2019   Generalized abdominal pain 11/06/2019   Pre-ulcerative corn or callous 09/07/2019   Nausea 09/07/2019   Pain of left calf 05/09/2019   Tibial pain 05/09/2019   Essential hypertension 02/08/2019   Healthcare maintenance 11/10/2018   Bradycardia 07/22/2018   Pansinusitis 06/23/2018   History of transient ischemic attack (TIA) 03/24/2018   IPF (idiopathic pulmonary fibrosis) (HCC) 03/24/2018   Chronic rhinitis 02/16/2018   Eustachian tube dysfunction, bilateral 02/16/2018   URI (upper respiratory infection) 02/16/2018   Seasonal allergic rhinitis due to pollen 02/16/2018   ILD (interstitial lung disease) (HCC) 01/07/2018   Dyspnea 01/07/2018   Acute respiratory failure (HCC) 01/06/2018   TIA (transient ischemic attack) 01/06/2018   Hypokalemia 12/15/2017   Chronic cough 11/26/2017   Basal cell carcinoma (BCC) of neck 06/07/2017   Anisometropia 06/02/2017   Drusen of macula of both eyes 06/02/2017   Photopsia of left eye 06/02/2017   Cardiac murmur 05/07/2017   Situational anxiety 05/07/2017   Headache 05/07/2017   Mitral and aortic insufficiency 05/07/2017   Migraine without status migrainosus, not intractable 06/09/2016   Insomnia 11/18/2015   Normal coronary arteries 05/20/2015   RBBB 05/20/2015   Abnormal CT of the chest 05/20/2015   Hx of myocardial infarction 05/08/2015   Bruising 05/08/2015   Upper airway cough syndrome 02/14/2015   Asthmatic bronchitis with acute exacerbation 02/06/2015   Mild intermittent asthma without complication 02/06/2015   Oliguria 12/28/2014   Hypothyroidism 10/25/2014   Fatigue 10/25/2014   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   Chronic venous insufficiency 10/24/2014   Varicose veins of leg with complications 10/24/2014   Degeneration of intervertebral disc of cervical region 06/29/2014    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   Generalized osteoarthritis of multiple sites 06/29/2014   History of stroke without residual deficits 06/29/2014   Palpitations 11/08/2013   Edema of lower extremity 10/03/2013   Hair loss 11/25/2012   Alopecia 11/25/2012   Diverticulitis of large intestine with perforation and abscess 07/14/2012   Hammer toe 06/13/2012   History of stress test 05/21/2011   Hx of echocardiogram 01/27/2010   H/O cardiac catheterization 2004   Past Medical History:  Diagnosis Date   Arthritis    Bronchial pneumonia    Chronic facial pain 08/14/2022   Colon polyp    Diverticulitis    DVT (deep venous thrombosis) (HCC)    lower extremity   H/O cardiac catheterization 2004   Normal coronary arteries   Heart murmur    History of stress test 05/21/2011   Hx of echocardiogram 01/27/2010   Normal Ef 55% the transmitral spectral doppler flow pattern is normal for age. the left ventricular wall motion is normal   Hypothyroidism    IPF (idiopathic pulmonary fibrosis) (HCC) 01/2018   Myocardial infarction (HCC)    hx of 30 years ago   Pancreatitis    TIA (transient ischemic attack)    8 years ago   Past Surgical History:  Procedure Laterality Date   BREAST BIOPSY Left    [facility]   CARDIAC  CATHETERIZATION     10/2013   CATARACT EXTRACTION Bilateral 03/22/2018   FLEXIBLE SIGMOIDOSCOPY N/A 08/13/2022   Procedure: FLEXIBLE SIGMOIDOSCOPY;  Surgeon: Karie Soda, MD;  Location: [facility];  Service: General;  Laterality: N/A;   ILEOSTOMY CLOSURE N/A 11/19/2022   Procedure: OPEN TAKEDOWN OF LOOP ILEOSTOMY;  Surgeon: Karie Soda, MD;  Location: [facility];  Service: General;  Laterality: N/A;   LEFT HEART CATHETERIZATION WITH CORONARY ANGIOGRAM N/A 10/25/2013   Procedure: LEFT HEART CATHETERIZATION WITH CORONARY ANGIOGRAM;  Surgeon: Lennette Bihari, MD;  Location: [facility] CATH LAB;  Service: Cardiovascular;  Laterality: N/A;   LYSIS OF ADHESION N/A 08/13/2022    Procedure: LYSIS OF ADHESION, DIVERTING ILEOSTOMY;  Surgeon: Karie Soda, MD;  Location: [facility];  Service: General;  Laterality: N/A;   NECK SURGERY     ACDF x 2      done in [place] more than 25 years, doesn't know levels   TOTAL KNEE ARTHROPLASTY     Bilateral x's 2   VAGINAL HYSTERECTOMY  10/17/1998   Konrad Dolores   VIDEO BRONCHOSCOPY Bilateral 01/24/2018   Procedure: VIDEO BRONCHOSCOPY WITH FLUORO;  Surgeon: Lupita Leash, MD;  Location[facility] ENDOSCOPY;  Service: Cardiopulmonary;  Laterality: Bilateral;   XI ROBOTIC ASSISTED COLOSTOMY TAKEDOWN N/A 08/13/2022   Procedure: ROBOTIC OSTOMY TAKEDOWN, SMALL BOWEL RESECTION X 2, BILATERAL TAP BLOCK, TISSUE PERFUSSION ASSESMENT VIA FIREFLY, MOBILAZATION OF SPLENIC FLEXURE;  Surgeon: Karie Soda, MD;  Location: [facility];  Service: General;  Laterality: N/A;   XI ROBOTIC ASSISTED LOWER ANTERIOR RESECTION N/A 03/31/2022   Procedure: XI ROBOTIC ASSISTED LOWER ANTERIOR RESECTION;  Surgeon: Karie Soda, MD;  Location: [facility];  Service: General;  Laterality: N/A;   Social History   Tobacco Use   Smoking status: Never   Smokeless tobacco: Never  Vaping Use   Vaping status: Never Used  Substance Use Topics   Alcohol use: No    Alcohol/week: 0.0 standard drinks of alcohol   Drug use: No   Social History   Socioeconomic History   Marital status: Married    Spouse name: Tasia Catchings   Number of children: 3   Years of education: Jr college   Highest education level: Not on file  Occupational History   Occupation: Leisure centre manager: UNEMPLOYED   Occupation: retired  Tobacco Use   Smoking status: Never   Smokeless tobacco: Never  Vaping Use   Vaping status: Never Used  Substance and Sexual Activity   Alcohol use: No    Alcohol/week: 0.0 standard drinks of alcohol   Drug use: No   Sexual activity: Not Currently  Other Topics Concern   Not on file  Social History Narrative   Lives with husband Tasia Catchings   Caffeine use: coffee (2 cups  per day)   Mostly right-handed   Social Determinants of Health   Financial Resource Strain: Not on file  Food Insecurity: Patient Declined (11/20/2022)   Hunger Vital Sign    Worried About Running Out of Food in the Last Year: Patient declined    Ran Out of Food in the Last Year: Patient declined  Transportation Needs: No Transportation Needs (11/24/2022)   PRAPARE - Administrator, Civil Service (Medical): No    Lack of Transportation (Non-Medical): No  Physical Activity: Not on file  Stress: Not on file  Social Connections: Not on file  Intimate Partner Violence: Patient Declined (11/20/2022)   Humiliation, Afraid, Rape, and Kick questionnaire  Fear of Current or Ex-Partner: Patient declined    Emotionally Abused: Patient declined    Physically Abused: Patient declined    Sexually Abused: Patient declined   Family Status  Relation Name Status   Mother  Deceased   Father  Deceased   Brother  (Not Specified)   Brother  (Not Specified)   Brother  (Not Specified)   Brother  (Not Specified)   Brother  (Not Specified)   Mat Aunt  (Not Specified)   MGM  (Not Specified)   Other  (Not Specified)   Neg Hx  (Not Specified)  No partnership data on file   Family History  Problem Relation Age of Onset   Ovarian cancer Mother    Uterine cancer Mother    Lung cancer Father    HIV Brother        1982   Kidney cancer Brother    Lung cancer Brother    Other Brother        Mouth Cancer   Colon cancer Maternal Aunt    Heart disease Maternal Grandmother    Stroke Maternal Grandmother    Hypertension Maternal Grandmother    Diabetes Maternal Grandmother    Arthritis Other    Esophageal cancer Neg Hx    Liver disease Neg Hx    Rectal cancer Neg Hx    Stomach cancer Neg Hx    Allergies  Allergen Reactions   Prednisone Other (See Comments)    Changed personality    Dilaudid [Hydromorphone] Itching and Other (See Comments)    Dilaudid caused marked confusion    Atrovent Hfa [Ipratropium Bromide Hfa] Itching and Other (See Comments)    Pt could not sleep, Insomnia    Cheratussin Ac [Guaifenesin-Codeine] Other (See Comments)    Headaches    Diltiazem Itching and Other (See Comments)    Makes patient sick. Shakes. GI   Hydrocodone Itching   Influenza Vaccines Other (See Comments)    Pt reports heart attack after last flu shot   Morphine Itching   Motrin [Ibuprofen] Itching and Other (See Comments)    "Gives false reading in blood"   Oxycodone Itching   Codeine Itching, Nausea Only and Other (See Comments)    Pt takes promethazine with codeine at home      Review of Systems  Constitutional:  Negative for fever.  HENT:  Negative for congestion.   Eyes:  Negative for blurred vision.  Respiratory:  Negative for cough.   Cardiovascular:  Negative for chest pain and palpitations.  Gastrointestinal:  Positive for diarrhea. Negative for vomiting.  Musculoskeletal:  Negative for back pain.  Skin:  Negative for rash.  Neurological:  Positive for dizziness and headaches. Negative for loss of consciousness.      Objective:     BP (!) 140/80 (BP Location: Left Arm, Patient Position: Sitting, Cuff Size: Normal)   Pulse 86   Temp 98.4 F (36.9 C) (Oral)   Resp 18   Ht 5\' 3"  (1.6 m)   Wt 139 lb 3.2 oz (63.1 kg)   SpO2 96%   BMI 24.66 kg/m  BP Readings from Last 3 Encounters:  12/04/22 (!) 140/80  11/26/22 122/78  11/23/22 134/63   Wt Readings from Last 3 Encounters:  12/04/22 139 lb 3.2 oz (63.1 kg)  11/26/22 139 lb 6.4 oz (63.2 kg)  11/23/22 137 lb 12.6 oz (62.5 kg)   SpO2 Readings from Last 3 Encounters:  12/04/22 96%  11/26/22 94%  11/23/22 97%  Physical Exam Vitals and nursing note reviewed.  Constitutional:      General: She is not in acute distress.    Appearance: Normal appearance. She is well-developed.  HENT:     Head: Normocephalic and atraumatic.     Nose: Congestion and rhinorrhea present.  Eyes:      General: No scleral icterus.       Right eye: No discharge.        Left eye: No discharge.  Cardiovascular:     Rate and Rhythm: Normal rate and regular rhythm.     Heart sounds: No murmur heard. Pulmonary:     Effort: Pulmonary effort is normal. No respiratory distress.     Breath sounds: Normal breath sounds.  Musculoskeletal:        General: Normal range of motion.     Cervical back: Normal range of motion and neck supple.     Right lower leg: No edema.     Left lower leg: No edema.  Skin:    General: Skin is warm and dry.  Neurological:     Mental Status: She is alert and oriented to person, place, and time.  Psychiatric:        Mood and Affect: Mood normal.        Behavior: Behavior normal.        Thought Content: Thought content normal.        Judgment: Judgment normal.      No results found for any visits on 12/04/22.  Last CBC Lab Results  Component Value Date   WBC 7.1 12/01/2022   HGB 10.3 (L) 12/01/2022   HCT 31.1 (L) 12/01/2022   MCV 98.1 12/01/2022   MCH 32.6 11/20/2022   RDW 16.5 (H) 12/01/2022   PLT 515.0 (H) 12/01/2022   Last metabolic panel Lab Results  Component Value Date   GLUCOSE 96 11/26/2022   NA 134 (L) 11/26/2022   K 4.1 11/26/2022   CL 96 11/26/2022   CO2 30 11/26/2022   BUN 13 11/26/2022   CREATININE 0.87 11/26/2022   GFR 64.03 11/26/2022   CALCIUM 8.9 11/26/2022   PHOS 2.4 (L) 04/21/2022   PROT 7.1 11/26/2022   ALBUMIN 3.7 11/26/2022   BILITOT 0.9 11/26/2022   ALKPHOS 58 11/26/2022   AST 20 11/26/2022   ALT 10 11/26/2022   ANIONGAP 11 11/20/2022   Last lipids Lab Results  Component Value Date   CHOL 162 11/26/2022   HDL 47.20 11/26/2022   LDLCALC 96 11/26/2022   TRIG 94.0 11/26/2022   CHOLHDL 3 11/26/2022   Last hemoglobin A1c Lab Results  Component Value Date   HGBA1C 5.4 04/05/2022   Last thyroid functions Lab Results  Component Value Date   TSH 34.34 (H) 11/26/2022   T4TOTAL 11.0 08/22/2021   Last vitamin  D Lab Results  Component Value Date   VD25OH 29.9 (L) 05/09/2019   Last vitamin B12 and Folate Lab Results  Component Value Date   VITAMINB12 640 04/18/2022   FOLATE 10.8 04/18/2022      The ASCVD Risk score (Arnett DK, et al., 2019) failed to calculate for the following reasons:   The patient has a prior MI or stroke diagnosis    Assessment & Plan:   Problem List Items Addressed This Visit       Unprioritized   Migraine without status migrainosus, not intractable   Pansinusitis   Relevant Medications   amoxicillin-clavulanate (AUGMENTIN) 875-125 MG tablet   fluticasone (FLONASE) 50 MCG/ACT  nasal spray   IPF (idiopathic pulmonary fibrosis) (HCC)   Relevant Medications   amoxicillin-clavulanate (AUGMENTIN) 875-125 MG tablet   fluticasone (FLONASE) 50 MCG/ACT nasal spray   Hypothyroidism   Relevant Orders   Comprehensive metabolic panel   Lipid panel   TSH   Other Visit Diagnoses     Anemia, unspecified type    -  Primary   Relevant Orders   CBC with Differential/Platelet   Iron, TIBC and Ferritin Panel   Suspicious nevus       Relevant Orders   Ambulatory referral to Dermatology     .Assessment and Plan    Diarrhea Likely secondary to poor diet high in sugar and low in fiber. Possible contribution from recent initiation of Synthroid. -Advise dietary modification to include more fiber and less sugar. -Continue Synthroid as prescribed.  Sinus Headache Unilateral sinus congestion and headache. -Administer Toradol shot for acute pain relief. -Prescribe Flonase for sinus congestion.  Anemia Hemoglobin improved from 8.7 to 10.3. Likely related to poor diet. -Continue dietary modification. -Repeat labs at a future date to monitor hemoglobin levels.  Skin Lesion Chronic skin lesion present for several months. -Refer to dermatology for evaluation and possible biopsy.  Dehydration Reports of dizziness and inadequate water intake. -Advise increased water  intake. -Continue Liquid IV as needed.        No follow-ups on file.    Donato Schultz, DO

## 2022-12-05 LAB — LIPID PANEL
Cholesterol: 177 mg/dL (ref <200)
HDL: 44 mg/dL — ABNORMAL LOW (ref >=50)
LDL Cholesterol (Calc): 113 mg/dL — ABNORMAL HIGH
Non-HDL Cholesterol (Calc): 133 mg/dL — ABNORMAL HIGH (ref <130)
Total CHOL/HDL Ratio: 4 (calc) (ref <5.0)
Triglycerides: 95 mg/dL (ref <150)

## 2022-12-05 LAB — CBC WITH DIFFERENTIAL/PLATELET
Absolute Monocytes: 1070 {cells}/uL — ABNORMAL HIGH (ref 200–950)
Basophils Absolute: 92 {cells}/uL (ref 0–200)
Basophils Relative: 1.2 %
Eosinophils Absolute: 447 {cells}/uL (ref 15–500)
Eosinophils Relative: 5.8 %
HCT: 30.1 % — ABNORMAL LOW (ref 35.0–45.0)
Hemoglobin: 9.7 g/dL — ABNORMAL LOW (ref 11.7–15.5)
Lymphs Abs: 1632 {cells}/uL (ref 850–3900)
MCH: 31.3 pg (ref 27.0–33.0)
MCHC: 32.2 g/dL (ref 32.0–36.0)
MCV: 97.1 fL (ref 80.0–100.0)
MPV: 9.2 fL (ref 7.5–12.5)
Monocytes Relative: 13.9 %
Neutro Abs: 4458 {cells}/uL (ref 1500–7800)
Neutrophils Relative %: 57.9 %
Platelets: 482 10*3/uL — ABNORMAL HIGH (ref 140–400)
RBC: 3.1 10*6/uL — ABNORMAL LOW (ref 3.80–5.10)
RDW: 14.6 % (ref 11.0–15.0)
Total Lymphocyte: 21.2 %
WBC: 7.7 10*3/uL (ref 3.8–10.8)

## 2022-12-05 LAB — COMPREHENSIVE METABOLIC PANEL
AG Ratio: 1.2 (calc) (ref 1.0–2.5)
ALT: 10 U/L (ref 6–29)
AST: 17 U/L (ref 10–35)
Albumin: 3.8 g/dL (ref 3.6–5.1)
Alkaline phosphatase (APISO): 64 U/L (ref 37–153)
BUN: 16 mg/dL (ref 7–25)
CO2: 26 mmol/L (ref 20–32)
Calcium: 9.3 mg/dL (ref 8.6–10.4)
Chloride: 105 mmol/L (ref 98–110)
Creat: 0.84 mg/dL (ref 0.60–1.00)
Globulin: 3.2 g/dL (ref 1.9–3.7)
Glucose, Bld: 124 mg/dL — ABNORMAL HIGH (ref 65–99)
Potassium: 4.5 mmol/L (ref 3.5–5.3)
Sodium: 140 mmol/L (ref 135–146)
Total Bilirubin: 0.7 mg/dL (ref 0.2–1.2)
Total Protein: 7 g/dL (ref 6.1–8.1)

## 2022-12-05 LAB — IRON,TIBC AND FERRITIN PANEL
%SAT: 17 % (ref 16–45)
Ferritin: 136 ng/mL (ref 16–288)
Iron: 44 ug/dL — ABNORMAL LOW (ref 45–160)
TIBC: 256 ug/dL (ref 250–450)

## 2022-12-05 LAB — TSH: TSH: 54.64 m[IU]/L — ABNORMAL HIGH (ref 0.40–4.50)

## 2022-12-10 ENCOUNTER — Other Ambulatory Visit: Payer: Self-pay | Admitting: Family Medicine

## 2022-12-10 DIAGNOSIS — D649 Anemia, unspecified: Secondary | ICD-10-CM

## 2022-12-11 ENCOUNTER — Telehealth: Payer: Self-pay | Admitting: Family Medicine

## 2022-12-11 MED ORDER — LEVOTHYROXINE SODIUM 125 MCG PO TABS: 125.0000 ug | ORAL_TABLET | Freq: Every day | ORAL | 2 refills | Status: DC

## 2022-12-11 NOTE — Telephone Encounter (Signed)
Labs were resulted yesterday. Medication has been sent.

## 2022-12-11 NOTE — Telephone Encounter (Signed)
Pt called stating that her synthroid was not called in for the and she was wanting to know why. After reviewing chart, advised pt that a note would be sent back to look into this as it had not been sent in yet. Routed HP due to internal error.

## 2022-12-11 NOTE — Addendum Note (Signed)
Addended by: Roxanne Gates on: 12/11/2022 09:54 AM   Modules accepted: Orders

## 2022-12-21 ENCOUNTER — Encounter: Payer: Self-pay | Admitting: Cardiovascular Disease

## 2022-12-21 ENCOUNTER — Ambulatory Visit: Payer: Medicare Other | Attending: Cardiovascular Disease | Admitting: Cardiovascular Disease

## 2022-12-21 VITALS — BP 153/82 | HR 79 | Ht 64.0 in | Wt 137.4 lb

## 2022-12-21 DIAGNOSIS — I352 Nonrheumatic aortic (valve) stenosis with insufficiency: Secondary | ICD-10-CM | POA: Diagnosis not present

## 2022-12-21 DIAGNOSIS — I491 Atrial premature depolarization: Secondary | ICD-10-CM | POA: Insufficient documentation

## 2022-12-21 DIAGNOSIS — I351 Nonrheumatic aortic (valve) insufficiency: Secondary | ICD-10-CM | POA: Diagnosis not present

## 2022-12-21 DIAGNOSIS — I4510 Unspecified right bundle-branch block: Secondary | ICD-10-CM | POA: Diagnosis not present

## 2022-12-21 DIAGNOSIS — E7800 Pure hypercholesterolemia, unspecified: Secondary | ICD-10-CM | POA: Diagnosis not present

## 2022-12-21 DIAGNOSIS — I5032 Chronic diastolic (congestive) heart failure: Secondary | ICD-10-CM | POA: Diagnosis not present

## 2022-12-21 DIAGNOSIS — I951 Orthostatic hypotension: Secondary | ICD-10-CM | POA: Insufficient documentation

## 2022-12-21 NOTE — Patient Instructions (Addendum)
Medication Instructions:  No changes *If you need a refill on your cardiac medications before your next appointment, please call your pharmacy*  Testing/Procedures: Your physician has requested that you have an echocardiogram in one year, September 2025. Echocardiography is a painless test that uses sound waves to create images of your heart. It provides your doctor with information about the size and shape of your heart and how well your heart's chambers and valves are working. This procedure takes approximately one hour. There are no restrictions for this procedure. Please do NOT wear cologne, perfume, aftershave, or lotions (deodorant is allowed). Please arrive 15 minutes prior to your appointment time.    Follow-Up: At [facility], you and your health needs are our priority.  As part of our continuing mission to provide you with exceptional heart care, we have created designated Provider Care Teams.  These Care Teams include your primary Cardiologist (physician) and Advanced Practice Providers (APPs -  Physician Assistants and Nurse Practitioners) who all work together to provide you with the care you need, when you need it.  We recommend signing up for the patient portal called "MyChart".  Sign up information is provided on this After Visit Summary.  MyChart is used to connect with patients for Virtual Visits (Telemedicine).  Patients are able to view lab/test results, encounter notes, upcoming appointments, etc.  Non-urgent messages can be sent to your provider as well.   To learn more about what you can do with MyChart, go to [email or web address].    Your next appointment:   1 year(s)  Provider:   Dr Royann Shivers

## 2022-12-21 NOTE — Progress Notes (Unsigned)
Cardiology Office Note:    Date:  12/22/2022   ID:  Alexandria [name], DOB 12-29-1945, MRN [id number]  PCP:  Zola Button, Grayling Congress, DO   [facility] Providers Cardiologist:  Thomasene Ripple, DO     Referring MD: Zola Button, Grayling Congress, *   Chief Complaint  Patient presents with   Dizziness    History of Present Illness:    Alexandria [name] is a 77 y.o. female with a hx of colostomy following partial colectomy for severe diverticulitis, interstitial lung disease, chronic venous insufficiency of the lower extremities, bronchial asthma, hypothyroidism, reported diagnosis of diastolic heart failure referred for incidental bradycardia.  She has had a long recovery from her abdominal infection and surgery with complicated diverticulitis in December.  She had severe sigmoid diverticulosis with a pericolic fluid collection consistent with diverticular abscess.  She had rectosigmoid resection with colostomy performed by Dr. Michaell Cowing 03/31/2022.  She had problems with new abscess formation that required interventional radiology guided drainage on 04/12/2022.  Prolonged ileus led to need for TPN and she received transfusions for anemia.  She finally underwent takedown of her colostomy 11/19/2022 and was discharged from the hospital 11/23/2022.  Since then she has been having a lot of problems with diarrhea and dizziness.  Shortly after she eats she has a watery stool and if she tries to stand up she feels like "all the life is draining from her".  She has not had actual syncope or falls.  She has been taking Lomotil to try to help with his symptoms, get some benefit.  Even before her colostomy she had problems with diarrhea related to treatment of pulmonary fibrosis with Ofev.  She has had artifactual "bradycardia" due to frequent premature atrial contractions but under counting of the true heart rate.  Coronary angiography in 2015 showed no coronary artery disease and a recent coronary CT  angiogram showed a calcium score of 0, although there was mild aortic atherosclerosis.  In 2021 a 30-day event monitor for complaints of dizziness showed occasional episodes of brief paroxysmal atrial tachycardia but did not show any meaningful bradycardia or AV block.  There was no atrial fibrillation.  Her echocardiogram in April 2024 showed normal left ventricular function, aortic valve sclerosis without stenosis but with mild-moderate insufficiency and mild dilation of the ascending aorta (on CT this was 4.1 cm).  There was "grade 1 diastolic dysfunction (impaired relaxation)".  The mitral annular diastolic velocities were quite low at 2.5 and 2.8 cm/s.    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 the left ventricular wall motion is normal   Hypothyroidism    IPF (idiopathic pulmonary fibrosis) (HCC) 01/2018   Myocardial infarction (HCC)    hx of 30 years ago   Pancreatitis    TIA (transient ischemic attack)    8 years ago    Past Surgical History:  Procedure Laterality Date   BREAST BIOPSY Left    [facility]   CARDIAC CATHETERIZATION     10/2013   CATARACT EXTRACTION Bilateral 03/22/2018  FLEXIBLE SIGMOIDOSCOPY N/A 08/13/2022   Procedure: FLEXIBLE SIGMOIDOSCOPY;  Surgeon: Karie Soda, MD;  Location: [facility];  Service: General;  Laterality: N/A;   ILEOSTOMY CLOSURE N/A 11/19/2022   Procedure: OPEN TAKEDOWN OF LOOP ILEOSTOMY;  Surgeon: Karie Soda, MD;  Location: [facility];  Service: General;  Laterality: N/A;   LEFT HEART CATHETERIZATION WITH CORONARY ANGIOGRAM N/A 10/25/2013   Procedure: LEFT HEART  CATHETERIZATION WITH CORONARY ANGIOGRAM;  Surgeon: Lennette Bihari, MD;  Location: [facility] CATH LAB;  Service: Cardiovascular;  Laterality: N/A;   LYSIS OF ADHESION N/A 08/13/2022   Procedure: LYSIS OF ADHESION, DIVERTING ILEOSTOMY;  Surgeon: Karie Soda, MD;  Location: [facility];  Service: General;  Laterality: N/A;   NECK SURGERY     ACDF x 2      done in [place] more than 25 years, doesn't know levels   TOTAL KNEE ARTHROPLASTY     Bilateral x's 2   VAGINAL HYSTERECTOMY  10/17/1998   Konrad Dolores   VIDEO BRONCHOSCOPY Bilateral 01/24/2018   Procedure: VIDEO BRONCHOSCOPY WITH FLUORO;  Surgeon: Lupita Leash, MD;  Location: [facility] ENDOSCOPY;  Service: Cardiopulmonary;  Laterality: Bilateral;   XI ROBOTIC ASSISTED COLOSTOMY TAKEDOWN N/A 08/13/2022   Procedure: ROBOTIC OSTOMY TAKEDOWN, SMALL BOWEL RESECTION X 2, BILATERAL TAP BLOCK, TISSUE PERFUSSION ASSESMENT VIA FIREFLY, MOBILAZATION OF SPLENIC FLEXURE;  Surgeon: Karie Soda, MD;  Location: [facility];  Service: General;  Laterality: N/A;   XI ROBOTIC ASSISTED LOWER ANTERIOR RESECTION N/A 03/31/2022   Procedure: XI ROBOTIC ASSISTED LOWER ANTERIOR RESECTION;  Surgeon: Karie Soda, MD;  Location: [facility];  Service: General;  Laterality: N/A;    Current Medications: Current Meds  Medication Sig   amoxicillin-clavulanate (AUGMENTIN) 875-125 MG tablet Take 1 tablet by mouth 2 (two) times daily.   aspirin EC 81 MG tablet Take 81 mg by mouth 3 (three) times a week. Swallow whole.   ferrous sulfate 325 (65 FE) MG tablet Take 1 tablet (325 mg total) by mouth 2 (two) times daily with a meal.   fluticasone (FLONASE) 50 MCG/ACT nasal spray Place 2 sprays into both nostrils daily.   levothyroxine (SYNTHROID) 125 MCG tablet Take 1 tablet (125 mcg total) by mouth daily.   Multiple Vitamin (MULTIVITAMIN WITH MINERALS) TABS tablet Take 1 tablet by mouth once a week.   OFEV 100 MG CAPS Take 100 mg by mouth 2 (two) times daily.   potassium chloride SA (KLOR-CON M) 20  MEQ tablet Take 20 mEq by mouth daily.   vitamin C (ASCORBIC ACID) 500 MG tablet Take 500 mg by mouth 2 (two) times a week.     Allergies:   Prednisone, Dilaudid [hydromorphone], Atrovent hfa [ipratropium bromide hfa], Cheratussin ac [guaifenesin-codeine], Diltiazem, Hydrocodone, Influenza vaccines, Morphine, Motrin [ibuprofen], Oxycodone, and Codeine   Social History   Socioeconomic History   Marital status: Married    Spouse name: Tasia Catchings   Number of children: 3   Years of education: Jr college   Highest education level: Not on file  Occupational History   Occupation: Leisure centre manager: UNEMPLOYED   Occupation: retired  Tobacco Use   Smoking status: Never   Smokeless tobacco: Never  Vaping Use   Vaping status: Never Used  Substance and Sexual Activity   Alcohol use: No    Alcohol/week: 0.0 standard drinks of alcohol   Drug use: No   Sexual activity: Not Currently  Other Topics Concern   Not on file  Social History Narrative   Lives with  husband Tasia Catchings   Caffeine use: coffee (2 cups per day)   Mostly right-handed   Social Determinants of Health   Financial Resource Strain: Not on file  Food Insecurity: Patient Declined (11/20/2022)   Hunger Vital Sign    Worried About Running Out of Food in the Last Year: Patient declined    Ran Out of Food in the Last Year: Patient declined  Transportation Needs: No Transportation Needs (11/24/2022)   PRAPARE - Administrator, Civil Service (Medical): No    Lack of Transportation (Non-Medical): No  Physical Activity: Not on file  Stress: Not on file  Social Connections: Not on file     Family History: The patient's family history includes Arthritis in an other family member; Colon cancer in her maternal aunt; Diabetes in her maternal grandmother; HIV in her brother; Heart disease in her maternal grandmother; Hypertension in her maternal grandmother; Kidney cancer in her brother; Lung cancer in her brother and father;  Other in her brother; Ovarian cancer in her mother; Stroke in her maternal grandmother; Uterine cancer in her mother. There is no history of Esophageal cancer, Liver disease, Rectal cancer, or Stomach cancer.  ROS:   Please see the history of present illness.     All other systems reviewed and are negative.  EKGs/Labs/Other Studies Reviewed:    The following studies were reviewed today:  Coronary CT angiogram 10/06/2019  1. Coronary calcium score of 0. This was 0 percentile for age and sex matched control.   2. Normal coronary origin with right dominance.   3. No evidence of CAD.   4.  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   5.  Mild mitral annular calcification.   5.  Aortic Atherosclerosis.    Arrhythmia monitor 01/12/2020  Conclusion: This study is remarkable for supraventricular tachycardia which is likely atrial tachycardia with variable block.   Echocardiogram 07/09/2022   1. Left ventricular ejection fraction, by estimation, is 55 to 60%. The  left ventricle has normal function. The left ventricle has no regional  wall motion abnormalities. There is mild concentric left ventricular  hypertrophy. Left ventricular diastolic  parameters are consistent with Grade I diastolic dysfunction (impaired  relaxation).   2. Right ventricular systolic function is normal. The right ventricular  size is normal. Tricuspid regurgitation signal is inadequate for assessing  PA pressure.   3. The mitral valve is normal in structure. Mild mitral valve  regurgitation. No evidence of mitral stenosis. Moderate mitral annular  calcification.   4. The aortic valve is tricuspid. There is mild calcification of the  aortic valve. Aortic valve regurgitation is mild to moderate. No aortic  stenosis is present.   5. Aortic dilatation noted. There is mild dilatation of the ascending  aorta, measuring 42 mm.   6. IVC not visualized.   7. Frequent PACs noted   EKG:  EKG is not ordered  today.   The ekg ordered 07/13/2022 shows sinus rhythm with atrial trigeminy and right bundle branch block The ECG from 05/28/2022 shows sinus rhythm with atrial trigeminy and right bundle branch block. ECG from 03/19/2022 shows sinus rhythm and bifascicular block (RBBB plus LAFB)  Recent Labs: 11/20/2022: Magnesium 2.0 12/04/2022: ALT 10; BUN 16; Creat 0.84; Hemoglobin 9.7; Platelets 482; Potassium 4.5; Sodium 140; TSH 54.64  Recent Lipid Panel    Component Value Date/Time   CHOL 177 12/04/2022 1505   CHOL 183 06/15/2022 0956   TRIG 95 12/04/2022 1505   HDL 44 (L)  12/04/2022 1505   HDL 42 06/15/2022 0956   CHOLHDL 4.0 12/04/2022 1505   VLDL 18.8 11/26/2022 1516   LDLCALC 113 (H) 12/04/2022 1505     Risk Assessment/Calculations:            Physical Exam:    VS:  BP (!) 153/82   Pulse 79   Ht 5\' 4"  (1.626 m)   Wt 137 lb 6.4 oz (62.3 kg)   SpO2 96%   BMI 23.58 kg/m     Wt Readings from Last 3 Encounters:  12/21/22 137 lb 6.4 oz (62.3 kg)  12/04/22 139 lb 3.2 oz (63.1 kg)  11/26/22 139 lb 6.4 oz (63.2 kg)      General: Alert, oriented x3, no distress, appears well Head: no evidence of trauma, PERRL, EOMI, no exophtalmos or lid lag, no myxedema, no xanthelasma; normal ears, nose and oropharynx Neck: normal jugular venous pulsations and no hepatojugular reflux; brisk carotid pulses without delay and no carotid bruits Chest: clear to auscultation, no signs of consolidation by percussion or palpation, normal fremitus, symmetrical and full respiratory excursions Cardiovascular: normal position and quality of the apical impulse, regular rhythm, normal first and widely split second heart sounds, 2/6 aortic ejection murmur, 1/6 decrescendo aortic diastolic murmur, no rubs or gallops Abdomen: no tenderness or distention, no masses by palpation, no abnormal pulsatility or arterial bruits, normal bowel sounds, no hepatosplenomegaly Extremities: no clubbing, cyanosis or edema; 2+  radial, ulnar and brachial pulses bilaterally; 2+ right femoral, posterior tibial and dorsalis pedis pulses; 2+ left femoral, posterior tibial and dorsalis pedis pulses; no subclavian or femoral bruits Neurological: grossly nonfocal Psych: Normal mood and affect   ASSESSMENT:    1. Nonrheumatic aortic valve insufficiency   2. Orthostatic hypotension   3. Premature atrial contractions   4. RBBB   5. Chronic diastolic CHF (congestive heart failure) (HCC)   6. Nonrheumatic aortic insufficiency with aortic stenosis   7. Hypercholesterolemia     PLAN:    In order of problems listed above:  Orthostatic hypotension: Likely related to hypovolemia due to frequent watery diarrhea following her multiple colectomy procedures.  Stay well-hydrated.  Avoid "perfect" control of blood pressure, tolerate systolic blood pressure in the 150s.  Start wearing compression hose again (she has previously worn compression pantyhose with improved symptoms, but was unable to wear them when she had a colostomy). watch carefully for C. difficile. PACs: Responsible for artifactual bradycardia, due to pulse deficit (under counting of her PACs when peripheral pulse palpation/pulse oximetry/automatic blood pressure cuff are used to measure her heart rate, not confirmed by electrical tracings).  A few years ago and lengthy arrhythmia monitor did not show any problems of bradycardia or AV block. RBBB: Chronic longstanding abnormality, sometimes with left axis deviation due to left anterior fascicular block.  There has been no documentation of second-degree or third-degree AV block.  Would avoid medications with negative chronotropic effect such as beta-blockers or centrally acting calcium channel blockers CHF: Clinically euvolemic.  She has not had any problems with shortness of breath even though we stopped her diuretics.  Avoid diuretics which could worsen her problems with orthostatic hypotension.  Would avoid SGLT2  inhibitors due to previous problems with pelvic infections. Breathing problems more likely related to ILD. AI: The murmur is louder than expected based on echo findings.  Due to aortoannular ectasia. Mildly dilated ascending aorta at 41 mm. Repeat echo next year. HLP: LDL cholesterol 113 is higher than desired (target less than 100  due to the presence of aortic atherosclerosis).  Would like to let her GI problems settle down before we start another medication.           Medication Adjustments/Labs and Tests Ordered: Current medicines are reviewed at length with the patient today.  Concerns regarding medicines are outlined above.  Orders Placed This Encounter  Procedures   ECHOCARDIOGRAM COMPLETE   No orders of the defined types were placed in this encounter.   Patient Instructions  Medication Instructions:  No changes *If you need a refill on your cardiac medications before your next appointment, please call your pharmacy*  Testing/Procedures: Your physician has requested that you have an echocardiogram in one year, September 2025. Echocardiography is a painless test that uses sound waves to create images of your heart. It provides your doctor with information about the size and shape of your heart and how well your heart's chambers and valves are working. This procedure takes approximately one hour. There are no restrictions for this procedure. Please do NOT wear cologne, perfume, aftershave, or lotions (deodorant is allowed). Please arrive 15 minutes prior to your appointment time.    Follow-Up: At [facility], you and your health needs are our priority.  As part of our continuing mission to provide you with exceptional heart care, we have created designated Provider Care Teams.  These Care Teams include your primary Cardiologist (physician) and Advanced Practice Providers (APPs -  Physician Assistants and Nurse Practitioners) who all work together to provide you with the  care you need, when you need it.  We recommend signing up for the patient portal called "MyChart".  Sign up information is provided on this After Visit Summary.  MyChart is used to connect with patients for Virtual Visits (Telemedicine).  Patients are able to view lab/test results, encounter notes, upcoming appointments, etc.  Non-urgent messages can be sent to your provider as well.   To learn more about what you can do with MyChart, go to [email or web address].    Your next appointment:   1 year(s)  Provider:   Dr Royann Shivers    Signed, Thurmon Fair, MD  12/22/2022 3:59 PM    [facility]

## 2022-12-22 ENCOUNTER — Encounter: Payer: Self-pay | Admitting: Cardiovascular Disease

## 2022-12-31 DIAGNOSIS — J84112 Idiopathic pulmonary fibrosis: Secondary | ICD-10-CM | POA: Diagnosis not present

## 2023-01-04 ENCOUNTER — Other Ambulatory Visit: Payer: Self-pay | Admitting: Family Medicine

## 2023-01-04 DIAGNOSIS — R110 Nausea: Secondary | ICD-10-CM

## 2023-01-05 ENCOUNTER — Ambulatory Visit (INDEPENDENT_AMBULATORY_CARE_PROVIDER_SITE_OTHER): Payer: Medicare Other | Admitting: Family Medicine

## 2023-01-05 ENCOUNTER — Encounter: Payer: Self-pay | Admitting: Family Medicine

## 2023-01-05 VITALS — BP 150/82 | HR 88 | Resp 18 | Ht 64.0 in | Wt 138.0 lb

## 2023-01-05 DIAGNOSIS — J324 Chronic pansinusitis: Secondary | ICD-10-CM | POA: Diagnosis not present

## 2023-01-05 DIAGNOSIS — J342 Deviated nasal septum: Secondary | ICD-10-CM | POA: Insufficient documentation

## 2023-01-05 DIAGNOSIS — D649 Anemia, unspecified: Secondary | ICD-10-CM | POA: Insufficient documentation

## 2023-01-05 DIAGNOSIS — D229 Melanocytic nevi, unspecified: Secondary | ICD-10-CM | POA: Diagnosis not present

## 2023-01-05 MED ORDER — AZELASTINE HCL 0.1 % NA SOLN
1.0000 | Freq: Two times a day (BID) | NASAL | 12 refills | Status: DC
Start: 2023-01-05 — End: 2023-03-03

## 2023-01-05 NOTE — Assessment & Plan Note (Signed)
Chronic----  refer to ent

## 2023-01-05 NOTE — Assessment & Plan Note (Signed)
Refer to derm  ?

## 2023-01-05 NOTE — Progress Notes (Signed)
Established Patient Office Visit  Subjective   Patient ID: Alexandria [name], female    DOB: Nov 08, 1945  Age: 77 y.o. MRN: [id number]  Chief Complaint  Patient presents with   Follow-up    HPI Discussed the use of AI scribe software for clinical note transcription with the patient, who gave verbal consent to proceed.  History of Present Illness   The patient presents for a checkup with ongoing wheezing, ear pain, and sinus pain. She reports a recent visit to a pulmonologist and an IPF doctor, where their oxygen level was satisfactory at 98%. Despite this, her respiratory and sinus symptoms persist. She also mentions a dermatological concern that is not resolving and request a referral to a dermatologist.  The patient also reports a recent visit to an ENT specialist for a deviated septum, which has resulted in difficulty breathing from one nostril. She express dissatisfaction with the outcome of the treatment and the doctor's response to her concerns. She is considering seeking treatment at [facility], despite the distance and potential logistical challenges.  The patient also mentions a history of anemia and is currently taking iron supplements. She express a preference for constipation, suggesting that she may have previously experienced issues with diarrhea.      Patient Active Problem List   Diagnosis Date Noted   Suspicious nevus 01/05/2023   Anemia 01/05/2023   Deviated septum 01/05/2023   Episodic tension-type headache, not intractable 11/19/2022   Old myocardial infarction 11/19/2022   Postthrombotic syndrome 11/19/2022   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   Irritable bowel syndrome without diarrhea 11/19/2022   Ileostomy care (HCC) 11/18/2022   Diaper candidiasis 09/28/2022   Ileostomy, has currently (HCC) 09/28/2022   At risk for dehydration 09/16/2022   At high risk for dehydration 09/14/2022   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   Dehydration 08/26/2022   Irritant  contact dermatitis associated with digestive stoma 08/26/2022   Ileostomy in place [facility]) 08/26/2022   High output ileostomy (HCC) 08/25/2022   Diastolic dysfunction 08/14/2022   Chronic facial pain 08/14/2022   Dyspepsia 08/14/2022   Blowout of rectal stump (HCC) 08/14/2022   Diverticulitis of colon 08/13/2022   Colostomy care (HCC) 07/31/2022   Bronchospasm 06/16/2022   Temporal arteritis (HCC) 06/16/2022   Chronic obstructive pulmonary disease with (acute) lower respiratory infection (HCC) 06/16/2022   Angina pectoris with documented spasm (HCC) 06/16/2022   Abdominal aortic aneurysm (AAA) without rupture (HCC) 06/16/2022   Right ankle sprain 06/09/2022   Sprain of right ankle 06/01/2022   Dizziness 06/01/2022   Rectal abscess 06/01/2022   Acute right ankle pain 05/19/2022   History of low anterior resection of rectum 05/04/2022   Irritant contact dermatitis associated with fecal stoma 05/02/2022   Slow transit constipation 05/02/2022   Colostomy complication (HCC) 05/02/2022   IDA (iron deficiency anemia) 04/19/2022   Intra-abdominal abscess (HCC) 04/18/2022   Acute blood loss anemia 04/17/2022   Chronic diastolic CHF (congestive heart failure) (HCC) 04/17/2022   Toxic metabolic encephalopathy 04/17/2022   Ileus following gastrointestinal surgery (HCC) 04/17/2022   Pelvic abscess in female 03/30/2022   Flat foot 11/19/2021   S/P total knee arthroplasty, left 11/13/2021   Ankle impingement syndrome, left 11/13/2021   Rectal bleeding 08/22/2021   Abnormal thyroid blood test 08/22/2021   Abnormal kidney function 08/22/2021   Dumping syndrome 03/13/2020   Arthritis    Bronchial pneumonia    Colon polyp    DVT (deep venous thrombosis) (HCC)    Heart murmur  Migraines    Pancreatitis    Thyroid disease    Aortic root dilatation (HCC) 11/24/2019   Leukocytosis 11/06/2019   Generalized abdominal pain 11/06/2019   Pre-ulcerative corn or callous 09/07/2019   Nausea  09/07/2019   Pain of left calf 05/09/2019   Tibial pain 05/09/2019   Essential hypertension 02/08/2019   Healthcare maintenance 11/10/2018   Bradycardia 07/22/2018   Pansinusitis 06/23/2018   History of transient ischemic attack (TIA) 03/24/2018   IPF (idiopathic pulmonary fibrosis) (HCC) 03/24/2018   Chronic rhinitis 02/16/2018   Eustachian tube dysfunction, bilateral 02/16/2018   URI (upper respiratory infection) 02/16/2018   Seasonal allergic rhinitis due to pollen 02/16/2018   ILD (interstitial lung disease) (HCC) 01/07/2018   Dyspnea 01/07/2018   Acute respiratory failure (HCC) 01/06/2018   TIA (transient ischemic attack) 01/06/2018   Hypokalemia 12/15/2017   Chronic cough 11/26/2017   Basal cell carcinoma (BCC) of neck 06/07/2017   Anisometropia 06/02/2017   Drusen of macula of both eyes 06/02/2017   Photopsia of left eye 06/02/2017   Cardiac murmur 05/07/2017   Situational anxiety 05/07/2017   Headache 05/07/2017   Mitral and aortic insufficiency 05/07/2017   Migraine without status migrainosus, not intractable 06/09/2016   Insomnia 11/18/2015   Normal coronary arteries 05/20/2015   RBBB 05/20/2015   Abnormal CT of the chest 05/20/2015   Hx of myocardial infarction 05/08/2015   Bruising 05/08/2015   Upper airway cough syndrome 02/14/2015   Asthmatic bronchitis with acute exacerbation 02/06/2015   Mild intermittent asthma without complication 02/06/2015   Oliguria 12/28/2014   Hypothyroidism 10/25/2014   Fatigue 10/25/2014   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   Chronic venous insufficiency 10/24/2014   Varicose veins of leg with complications 10/24/2014   Degeneration of intervertebral disc of cervical region 06/29/2014   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   Generalized osteoarthritis of multiple sites 06/29/2014   History of stroke without residual deficits 06/29/2014   Palpitations 11/08/2013   Edema of lower extremity 10/03/2013   Hair  loss 11/25/2012   Alopecia 11/25/2012   Diverticulitis of large intestine with perforation and abscess 07/14/2012   Hammer toe 06/13/2012   History of stress test 05/21/2011   Hx of echocardiogram 01/27/2010   H/O cardiac catheterization 2004   Past Medical History:  Diagnosis Date   Arthritis    Bronchial pneumonia    Chronic facial pain 08/14/2022   Colon polyp    Diverticulitis    DVT (deep venous thrombosis) (HCC)    lower extremity   H/O cardiac catheterization 2004   Normal coronary arteries   Heart murmur    History of stress test 05/21/2011   Hx of echocardiogram 01/27/2010   Normal Ef 55% the transmitral spectral doppler flow pattern is normal for age. the left ventricular wall motion is normal   Hypothyroidism    IPF (idiopathic pulmonary fibrosis) (HCC) 01/2018   Myocardial infarction (HCC)    hx of 30 years ago   Pancreatitis    TIA (transient ischemic attack)    8 years ago   Past Surgical History:  Procedure Laterality Date   BREAST BIOPSY Left    [facility]   CARDIAC CATHETERIZATION     10/2013   CATARACT EXTRACTION Bilateral 03/22/2018   FLEXIBLE SIGMOIDOSCOPY N/A 08/13/2022   Procedure: FLEXIBLE SIGMOIDOSCOPY;  Surgeon: Karie Soda, MD;  Location: [facility];  Service: General;  Laterality: N/A;   ILEOSTOMY CLOSURE N/A 11/19/2022   Procedure: OPEN TAKEDOWN OF LOOP ILEOSTOMY;  Surgeon: Karie Soda,  MD;  Location: [facility];  Service: General;  Laterality: N/A;   LEFT HEART CATHETERIZATION WITH CORONARY ANGIOGRAM N/A 10/25/2013   Procedure: LEFT HEART CATHETERIZATION WITH CORONARY ANGIOGRAM;  Surgeon: Lennette Bihari, MD;  Location: [facility] CATH LAB;  Service: Cardiovascular;  Laterality: N/A;   LYSIS OF ADHESION N/A 08/13/2022   Procedure: LYSIS OF ADHESION, DIVERTING ILEOSTOMY;  Surgeon: Karie Soda, MD;  Location: [facility];  Service: General;  Laterality: N/A;   NECK SURGERY     ACDF x 2      done in [place] more than 25 years, doesn't know levels   TOTAL KNEE  ARTHROPLASTY     Bilateral x's 2   VAGINAL HYSTERECTOMY  10/17/1998   Konrad Dolores   VIDEO BRONCHOSCOPY Bilateral 01/24/2018   Procedure: VIDEO BRONCHOSCOPY WITH FLUORO;  Surgeon: Lupita Leash, MD;  Location: [facility] ENDOSCOPY;  Service: Cardiopulmonary;  Laterality: Bilateral;   XI ROBOTIC ASSISTED COLOSTOMY TAKEDOWN N/A 08/13/2022   Procedure: ROBOTIC OSTOMY TAKEDOWN, SMALL BOWEL RESECTION X 2, BILATERAL TAP BLOCK, TISSUE PERFUSSION ASSESMENT VIA FIREFLY, MOBILAZATION OF SPLENIC FLEXURE;  Surgeon: Karie Soda, MD;  Location: [facility];  Service: General;  Laterality: N/A;   XI ROBOTIC ASSISTED LOWER ANTERIOR RESECTION N/A 03/31/2022   Procedure: XI ROBOTIC ASSISTED LOWER ANTERIOR RESECTION;  Surgeon: Karie Soda, MD;  Location: [facility];  Service: General;  Laterality: N/A;   Social History   Tobacco Use   Smoking status: Never   Smokeless tobacco: Never  Vaping Use   Vaping status: Never Used  Substance Use Topics   Alcohol use: No    Alcohol/week: 0.0 standard drinks of alcohol   Drug use: No   Social History   Socioeconomic History   Marital status: Married    Spouse name: Tasia Catchings   Number of children: 3   Years of education: Jr college   Highest education level: Not on file  Occupational History   Occupation: Leisure centre manager: UNEMPLOYED   Occupation: retired  Tobacco Use   Smoking status: Never   Smokeless tobacco: Never  Vaping Use   Vaping status: Never Used  Substance and Sexual Activity   Alcohol use: No    Alcohol/week: 0.0 standard drinks of alcohol   Drug use: No   Sexual activity: Not Currently  Other Topics Concern   Not on file  Social History Narrative   Lives with husband Tasia Catchings   Caffeine use: coffee (2 cups per day)   Mostly right-handed   Social Determinants of Health   Financial Resource Strain: Not on file  Food Insecurity: Patient Declined (11/20/2022)   Hunger Vital Sign    Worried About Running Out of Food in the Last Year: Patient  declined    Ran Out of Food in the Last Year: Patient declined  Transportation Needs: No Transportation Needs (11/24/2022)   PRAPARE - Administrator, Civil Service (Medical): No    Lack of Transportation (Non-Medical): No  Physical Activity: Not on file  Stress: Not on file  Social Connections: Not on file  Intimate Partner Violence: Patient Declined (11/20/2022)   Humiliation, Afraid, Rape, and Kick questionnaire    Fear of Current or Ex-Partner: Patient declined    Emotionally Abused: Patient declined    Physically Abused: Patient declined    Sexually Abused: Patient declined   Family Status  Relation Name Status   Mother  Deceased   Father  Deceased   Brother  (Not Specified)   Brother  (  Not Specified)   Brother  (Not Specified)   Brother  (Not Specified)   Brother  (Not Specified)   Mat Aunt  (Not Specified)   MGM  (Not Specified)   Other  (Not Specified)   Neg Hx  (Not Specified)  No partnership data on file   Family History  Problem Relation Age of Onset   Ovarian cancer Mother    Uterine cancer Mother    Lung cancer Father    HIV Brother        24   Kidney cancer Brother    Lung cancer Brother    Other Brother        Mouth Cancer   Colon cancer Maternal Aunt    Heart disease Maternal Grandmother    Stroke Maternal Grandmother    Hypertension Maternal Grandmother    Diabetes Maternal Grandmother    Arthritis Other    Esophageal cancer Neg Hx    Liver disease Neg Hx    Rectal cancer Neg Hx    Stomach cancer Neg Hx    Allergies  Allergen Reactions   Prednisone Other (See Comments)    Changed personality    Dilaudid [Hydromorphone] Itching and Other (See Comments)    Dilaudid caused marked confusion   Atrovent Hfa [Ipratropium Bromide Hfa] Itching and Other (See Comments)    Pt could not sleep, Insomnia    Cheratussin Ac [Guaifenesin-Codeine] Other (See Comments)    Headaches    Diltiazem Itching and Other (See Comments)    Makes patient  sick. Shakes. GI   Hydrocodone Itching   Influenza Vaccines Other (See Comments)    Pt reports heart attack after last flu shot   Morphine Itching   Motrin [Ibuprofen] Itching and Other (See Comments)    "Gives false reading in blood"   Oxycodone Itching   Codeine Itching, Nausea Only and Other (See Comments)    Pt takes promethazine with codeine at home    Review of Systems  Constitutional:  Negative for fever and malaise/fatigue.  HENT:  Negative for congestion.   Eyes:  Negative for blurred vision.  Respiratory:  Negative for cough and shortness of breath.   Cardiovascular:  Negative for chest pain, palpitations and leg swelling.  Gastrointestinal:  Negative for abdominal pain, blood in stool, nausea and vomiting.  Genitourinary:  Negative for dysuria and frequency.  Musculoskeletal:  Negative for back pain and falls.  Skin:  Negative for rash.  Neurological:  Negative for dizziness, loss of consciousness and headaches.  Endo/Heme/Allergies:  Negative for environmental allergies.  Psychiatric/Behavioral:  Negative for depression. The patient is not nervous/anxious.       Objective:     BP (!) 150/82   Pulse 88   Resp 18   Ht 5\' 4"  (1.626 m)   Wt 138 lb (62.6 kg)   SpO2 92%   BMI 23.69 kg/m  BP Readings from Last 3 Encounters:  01/05/23 (!) 150/82  12/21/22 (!) 153/82  12/04/22 (!) 140/80   Wt Readings from Last 3 Encounters:  01/05/23 138 lb (62.6 kg)  12/21/22 137 lb 6.4 oz (62.3 kg)  12/04/22 139 lb 3.2 oz (63.1 kg)   SpO2 Readings from Last 3 Encounters:  01/05/23 92%  12/21/22 96%  12/04/22 96%      Physical Exam Vitals and nursing note reviewed.  Constitutional:      General: She is not in acute distress.    Appearance: Normal appearance. She is well-developed.  HENT:  Head: Normocephalic and atraumatic.     Right Ear: Tympanic membrane, ear canal and external ear normal. There is no impacted cerumen.     Left Ear: Tympanic membrane, ear  canal and external ear normal. There is no impacted cerumen.     Nose: Nose normal.     Mouth/Throat:     Mouth: Mucous membranes are moist.     Pharynx: Oropharynx is clear. No oropharyngeal exudate or posterior oropharyngeal erythema.  Eyes:     General: No scleral icterus.       Right eye: No discharge.        Left eye: No discharge.     Conjunctiva/sclera: Conjunctivae normal.     Pupils: Pupils are equal, round, and reactive to light.  Neck:     Thyroid: No thyromegaly or thyroid tenderness.     Vascular: No JVD.  Cardiovascular:     Rate and Rhythm: Normal rate and regular rhythm.     Heart sounds: Normal heart sounds. No murmur heard. Pulmonary:     Effort: Pulmonary effort is normal. No respiratory distress.     Breath sounds: Normal breath sounds.  Abdominal:     General: Bowel sounds are normal. There is no distension.     Palpations: Abdomen is soft. There is no mass.     Tenderness: There is no abdominal tenderness. There is no guarding or rebound.  Genitourinary:    Vagina: Normal.  Musculoskeletal:        General: Normal range of motion.     Cervical back: Normal range of motion and neck supple.     Right lower leg: No edema.     Left lower leg: No edema.  Lymphadenopathy:     Cervical: No cervical adenopathy.  Skin:    General: Skin is warm and dry.     Findings: No erythema or rash.  Neurological:     Mental Status: She is alert and oriented to person, place, and time.     Cranial Nerves: No cranial nerve deficit.     Deep Tendon Reflexes: Reflexes are normal and symmetric.  Psychiatric:        Mood and Affect: Mood normal.        Behavior: Behavior normal.        Thought Content: Thought content normal.        Judgment: Judgment normal.      No results found for any visits on 01/05/23.  Last CBC Lab Results  Component Value Date   WBC 7.7 12/04/2022   HGB 9.7 (L) 12/04/2022   HCT 30.1 (L) 12/04/2022   MCV 97.1 12/04/2022   MCH 31.3 12/04/2022    RDW 14.6 12/04/2022   PLT 482 (H) 12/04/2022   Last metabolic panel Lab Results  Component Value Date   GLUCOSE 124 (H) 12/04/2022   NA 140 12/04/2022   K 4.5 12/04/2022   CL 105 12/04/2022   CO2 26 12/04/2022   BUN 16 12/04/2022   CREATININE 0.84 12/04/2022   GFR 64.03 11/26/2022   CALCIUM 9.3 12/04/2022   PHOS 2.4 (L) 04/21/2022   PROT 7.0 12/04/2022   ALBUMIN 3.7 11/26/2022   BILITOT 0.7 12/04/2022   ALKPHOS 58 11/26/2022   AST 17 12/04/2022   ALT 10 12/04/2022   ANIONGAP 11 11/20/2022   Last lipids Lab Results  Component Value Date   CHOL 177 12/04/2022   HDL 44 (L) 12/04/2022   LDLCALC 113 (H) 12/04/2022   TRIG 95 12/04/2022  CHOLHDL 4.0 12/04/2022   Last hemoglobin A1c Lab Results  Component Value Date   HGBA1C 5.4 04/05/2022   Last thyroid functions Lab Results  Component Value Date   TSH 54.64 (H) 12/04/2022   T4TOTAL 11.0 08/22/2021   Last vitamin D Lab Results  Component Value Date   VD25OH 29.9 (L) 05/09/2019   Last vitamin B12 and Folate Lab Results  Component Value Date   VITAMINB12 640 04/18/2022   FOLATE 10.8 04/18/2022      The ASCVD Risk score (Arnett DK, et al., 2019) failed to calculate for the following reasons:   The patient has a prior MI or stroke diagnosis    Assessment & Plan:   Problem List Items Addressed This Visit       Unprioritized   Suspicious nevus - Primary    Refer to derm      Relevant Orders   Ambulatory referral to Dermatology   Pansinusitis    Chronic----  refer to ent       Relevant Medications   azelastine (ASTELIN) 0.1 % nasal spray   Deviated septum   Relevant Orders   Ambulatory referral to ENT   Anemia   Relevant Orders   CBC with Differential/Platelet   Iron, TIBC and Ferritin Panel  Assessment and Plan    Sinusitis Persistent sinus pain and difficulty breathing due to deviated septum. Unsuccessful previous treatment by Dr. Jeral Pinch at [facility]. -Refer to [facility] or a local ENT for  further evaluation and treatment.  Skin Lesion Persistent skin lesion not resolving. -Refer to dermatologist in [place] for evaluation and possible treatment.  Anemia Possible anemia, unable to check hemoglobin due to lab closure. -Return to clinic for hemoglobin check. -Continue over-the-counter iron supplementation, consider slow-release formulation to avoid constipation.  General Health Maintenance -Declined flu shot and has not received COVID-19 vaccination.        No follow-ups on file.    Donato Schultz, DO

## 2023-01-06 ENCOUNTER — Other Ambulatory Visit (INDEPENDENT_AMBULATORY_CARE_PROVIDER_SITE_OTHER): Payer: Medicare Other

## 2023-01-06 DIAGNOSIS — D649 Anemia, unspecified: Secondary | ICD-10-CM | POA: Diagnosis not present

## 2023-01-06 LAB — CBC WITH DIFFERENTIAL/PLATELET
Basophils Absolute: 0.1 10*3/uL (ref 0.0–0.1)
Basophils Relative: 1.9 % (ref 0.0–3.0)
Eosinophils Absolute: 0.5 10*3/uL (ref 0.0–0.7)
Eosinophils Relative: 8.6 % — ABNORMAL HIGH (ref 0.0–5.0)
HCT: 37.9 % (ref 36.0–46.0)
Hemoglobin: 12.4 g/dL (ref 12.0–15.0)
Lymphocytes Relative: 23 % (ref 12.0–46.0)
Lymphs Abs: 1.4 10*3/uL (ref 0.7–4.0)
MCHC: 32.6 g/dL (ref 30.0–36.0)
MCV: 97.8 fL (ref 78.0–100.0)
Monocytes Absolute: 0.8 10*3/uL (ref 0.1–1.0)
Monocytes Relative: 13 % — ABNORMAL HIGH (ref 3.0–12.0)
Neutro Abs: 3.2 10*3/uL (ref 1.4–7.7)
Neutrophils Relative %: 53.5 % (ref 43.0–77.0)
Platelets: 246 10*3/uL (ref 150.0–400.0)
RBC: 3.88 Mil/uL (ref 3.87–5.11)
RDW: 14.9 % (ref 11.5–15.5)
WBC: 5.9 10*3/uL (ref 4.0–10.5)

## 2023-01-07 ENCOUNTER — Telehealth: Payer: Self-pay | Admitting: Family Medicine

## 2023-01-07 LAB — IRON,TIBC AND FERRITIN PANEL
%SAT: 33 % (ref 16–45)
Ferritin: 103 ng/mL (ref 16–288)
Iron: 79 ug/dL (ref 45–160)
TIBC: 240 ug/dL — ABNORMAL LOW (ref 250–450)

## 2023-01-07 NOTE — Telephone Encounter (Signed)
Pt called to advise that [facility] has not received referral and has requested that we fax it. Please fax to [phone].

## 2023-01-07 NOTE — Telephone Encounter (Signed)
Referral and demographics faxed.  

## 2023-01-11 ENCOUNTER — Ambulatory Visit: Payer: Medicare Other | Admitting: Family Medicine

## 2023-01-15 DIAGNOSIS — J0100 Acute maxillary sinusitis, unspecified: Secondary | ICD-10-CM | POA: Diagnosis not present

## 2023-02-05 ENCOUNTER — Ambulatory Visit (INDEPENDENT_AMBULATORY_CARE_PROVIDER_SITE_OTHER): Payer: Medicare Other | Admitting: Family Medicine

## 2023-02-05 ENCOUNTER — Encounter: Payer: Self-pay | Admitting: Family Medicine

## 2023-02-05 VITALS — BP 148/90 | HR 81 | Temp 97.8°F | Resp 18 | Ht 64.0 in | Wt 136.4 lb

## 2023-02-05 DIAGNOSIS — J84112 Idiopathic pulmonary fibrosis: Secondary | ICD-10-CM

## 2023-02-05 DIAGNOSIS — R600 Localized edema: Secondary | ICD-10-CM | POA: Diagnosis not present

## 2023-02-05 DIAGNOSIS — R051 Acute cough: Secondary | ICD-10-CM | POA: Diagnosis not present

## 2023-02-05 LAB — COMPREHENSIVE METABOLIC PANEL
AG Ratio: 1.3 (calc) (ref 1.0–2.5)
ALT: 9 U/L (ref 6–29)
AST: 17 U/L (ref 10–35)
Albumin: 4.2 g/dL (ref 3.6–5.1)
Alkaline phosphatase (APISO): 79 U/L (ref 37–153)
BUN: 11 mg/dL (ref 7–25)
CO2: 29 mmol/L (ref 20–32)
Calcium: 9.8 mg/dL (ref 8.6–10.4)
Chloride: 102 mmol/L (ref 98–110)
Creat: 0.88 mg/dL (ref 0.60–1.00)
Globulin: 3.3 g/dL (ref 1.9–3.7)
Glucose, Bld: 92 mg/dL (ref 65–99)
Potassium: 3.9 mmol/L (ref 3.5–5.3)
Sodium: 140 mmol/L (ref 135–146)
Total Bilirubin: 0.6 mg/dL (ref 0.2–1.2)
Total Protein: 7.5 g/dL (ref 6.1–8.1)

## 2023-02-05 LAB — POC COVID19 BINAXNOW: SARS Coronavirus 2 Ag: NEGATIVE

## 2023-02-05 MED ORDER — PROMETHAZINE-DM 6.25-15 MG/5ML PO SYRP: 5.0000 mL | ORAL_SOLUTION | Freq: Four times a day (QID) | ORAL | 0 refills | Status: DC | PRN

## 2023-02-05 MED ORDER — FUROSEMIDE 20 MG PO TABS
20.0000 mg | ORAL_TABLET | Freq: Every day | ORAL | 3 refills | Status: DC
Start: 2023-02-05 — End: 2024-01-25

## 2023-02-05 NOTE — Patient Instructions (Signed)

## 2023-02-05 NOTE — Progress Notes (Signed)
Established Patient Office Visit  Subjective   Patient ID: Alexandria [name], female    DOB: February 12, 1946  Age: 77 y.o. MRN: [id number]  Chief Complaint  Patient presents with   Cough    HPI Discussed the use of AI scribe software for clinical note transcription with the patient, who gave verbal consent to proceed.  History of Present Illness   The patient, with a history of chronic cough, presents with an exacerbation of her cough and new onset unilateral leg swelling. The cough has been more severe and is associated with shortness of breath, particularly when lying down. The patient denies chest pain, fever, and nasal symptoms. She has been using Flonase for nasal symptoms. The patient recently traveled by airplane to [facility] for a competition, which could be a contributing factor to her symptoms.  In addition to the cough, the patient noticed unilateral leg swelling, which is painless. The swelling is localized to the ankle and does not extend to the back of the leg. The patient denies any known injury or trauma to the leg. The patient has a history of surgery and was advised not to take furosemide postoperatively, but it is unclear when this medication was discontinued.  The patient also mentions an upcoming appointment with an ENT specialist for a separate issue. She has been experiencing wheezing, but only on one side. The patient does not feel tightness in the chest, but expresses a desire to clear the congestion.      Patient Active Problem List   Diagnosis Date Noted   Suspicious nevus 01/05/2023   Anemia 01/05/2023   Deviated septum 01/05/2023   Episodic tension-type headache, not intractable 11/19/2022   Old myocardial infarction 11/19/2022   Postthrombotic syndrome 11/19/2022   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   Irritable bowel syndrome without diarrhea 11/19/2022   Ileostomy care (HCC) 11/18/2022   Diaper candidiasis 09/28/2022   Ileostomy, has currently  (HCC) 09/28/2022   At risk for dehydration 09/16/2022   At high risk for dehydration 09/14/2022   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   Dehydration 08/26/2022   Irritant contact dermatitis associated with digestive stoma 08/26/2022   Ileostomy in place [facility]) 08/26/2022   High output ileostomy (HCC) 08/25/2022   Diastolic dysfunction 08/14/2022   Chronic facial pain 08/14/2022   Dyspepsia 08/14/2022   Blowout of rectal stump (HCC) 08/14/2022   Diverticulitis of colon 08/13/2022   Colostomy care (HCC) 07/31/2022   Bronchospasm 06/16/2022   Temporal arteritis (HCC) 06/16/2022   Chronic obstructive pulmonary disease with (acute) lower respiratory infection (HCC) 06/16/2022   Angina pectoris with documented spasm (HCC) 06/16/2022   Abdominal aortic aneurysm (AAA) without rupture (HCC) 06/16/2022   Right ankle sprain 06/09/2022   Sprain of right ankle 06/01/2022   Dizziness 06/01/2022   Rectal abscess 06/01/2022   Acute right ankle pain 05/19/2022   History of low anterior resection of rectum 05/04/2022   Irritant contact dermatitis associated with fecal stoma 05/02/2022   Slow transit constipation 05/02/2022   Colostomy complication (HCC) 05/02/2022   IDA (iron deficiency anemia) 04/19/2022   Intra-abdominal abscess (HCC) 04/18/2022   Acute blood loss anemia 04/17/2022   Chronic diastolic CHF (congestive heart failure) (HCC) 04/17/2022   Toxic metabolic encephalopathy 04/17/2022   Ileus following gastrointestinal surgery (HCC) 04/17/2022   Pelvic abscess in female 03/30/2022   Flat foot 11/19/2021   S/P total knee arthroplasty, left 11/13/2021   Ankle impingement syndrome, left 11/13/2021   Rectal bleeding 08/22/2021   Abnormal thyroid  blood test 08/22/2021   Abnormal kidney function 08/22/2021   Dumping syndrome 03/13/2020   Arthritis    Bronchial pneumonia    Colon polyp    DVT (deep venous thrombosis) (HCC)    Heart murmur    Migraines    Pancreatitis    Thyroid disease     Aortic root dilatation (HCC) 11/24/2019   Leukocytosis 11/06/2019   Generalized abdominal pain 11/06/2019   Pre-ulcerative corn or callous 09/07/2019   Nausea 09/07/2019   Pain of left calf 05/09/2019   Tibial pain 05/09/2019   Essential hypertension 02/08/2019   Healthcare maintenance 11/10/2018   Bradycardia 07/22/2018   Pansinusitis 06/23/2018   History of transient ischemic attack (TIA) 03/24/2018   IPF (idiopathic pulmonary fibrosis) (HCC) 03/24/2018   Chronic rhinitis 02/16/2018   Eustachian tube dysfunction, bilateral 02/16/2018   URI (upper respiratory infection) 02/16/2018   Seasonal allergic rhinitis due to pollen 02/16/2018   ILD (interstitial lung disease) (HCC) 01/07/2018   Dyspnea 01/07/2018   Acute respiratory failure (HCC) 01/06/2018   TIA (transient ischemic attack) 01/06/2018   Hypokalemia 12/15/2017   Chronic cough 11/26/2017   Basal cell carcinoma (BCC) of neck 06/07/2017   Anisometropia 06/02/2017   Drusen of macula of both eyes 06/02/2017   Photopsia of left eye 06/02/2017   Cardiac murmur 05/07/2017   Situational anxiety 05/07/2017   Headache 05/07/2017   Mitral and aortic insufficiency 05/07/2017   Migraine without status migrainosus, not intractable 06/09/2016   Insomnia 11/18/2015   Normal coronary arteries 05/20/2015   RBBB 05/20/2015   Abnormal CT of the chest 05/20/2015   Hx of myocardial infarction 05/08/2015   Bruising 05/08/2015   Upper airway cough syndrome 02/14/2015   Asthmatic bronchitis with acute exacerbation 02/06/2015   Mild intermittent asthma without complication 02/06/2015   Oliguria 12/28/2014   Hypothyroidism 10/25/2014   Fatigue 10/25/2014   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   Chronic venous insufficiency 10/24/2014   Varicose veins of leg with complications 10/24/2014   Degeneration of intervertebral disc of cervical region 06/29/2014   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   Generalized  osteoarthritis of multiple sites 06/29/2014   History of stroke without residual deficits 06/29/2014   Palpitations 11/08/2013   Edema of lower extremity 10/03/2013   Hair loss 11/25/2012   Alopecia 11/25/2012   Diverticulitis of large intestine with perforation and abscess 07/14/2012   Hammer toe 06/13/2012   History of stress test 05/21/2011   Hx of echocardiogram 01/27/2010   H/O cardiac catheterization 2004   Past Medical History:  Diagnosis Date   Arthritis    Bronchial pneumonia    Chronic facial pain 08/14/2022   Colon polyp    Diverticulitis    DVT (deep venous thrombosis) (HCC)    lower extremity   H/O cardiac catheterization 2004   Normal coronary arteries   Heart murmur    History of stress test 05/21/2011   Hx of echocardiogram 01/27/2010   Normal Ef 55% the transmitral spectral doppler flow pattern is normal for age. the left ventricular wall motion is normal   Hypothyroidism    IPF (idiopathic pulmonary fibrosis) (HCC) 01/2018   Myocardial infarction (HCC)    hx of 30 years ago   Pancreatitis    TIA (transient ischemic attack)    8 years ago   Past Surgical History:  Procedure Laterality Date   BREAST BIOPSY Left    [facility]   CARDIAC CATHETERIZATION     10/2013   CATARACT EXTRACTION Bilateral 03/22/2018  FLEXIBLE SIGMOIDOSCOPY N/A 08/13/2022   Procedure: FLEXIBLE SIGMOIDOSCOPY;  Surgeon: Karie Soda, MD;  Location: [facility];  Service: General;  Laterality: N/A;   ILEOSTOMY CLOSURE N/A 11/19/2022   Procedure: OPEN TAKEDOWN OF LOOP ILEOSTOMY;  Surgeon: Karie Soda, MD;  Location: [facility];  Service: General;  Laterality: N/A;   LEFT HEART CATHETERIZATION WITH CORONARY ANGIOGRAM N/A 10/25/2013   Procedure: LEFT HEART CATHETERIZATION WITH CORONARY ANGIOGRAM;  Surgeon: Lennette Bihari, MD;  Location: [facility] CATH LAB;  Service: Cardiovascular;  Laterality: N/A;   LYSIS OF ADHESION N/A 08/13/2022   Procedure: LYSIS OF ADHESION, DIVERTING ILEOSTOMY;  Surgeon: Karie Soda, MD;  Location: [facility];  Service: General;  Laterality: N/A;   NECK SURGERY     ACDF x 2      done in [place] more than 25 years, doesn't know levels   TOTAL KNEE ARTHROPLASTY     Bilateral x's 2   VAGINAL HYSTERECTOMY  10/17/1998   Konrad Dolores   VIDEO BRONCHOSCOPY Bilateral 01/24/2018   Procedure: VIDEO BRONCHOSCOPY WITH FLUORO;  Surgeon: Lupita Leash, MD;  Location: [facility] ENDOSCOPY;  Service: Cardiopulmonary;  Laterality: Bilateral;   XI ROBOTIC ASSISTED COLOSTOMY TAKEDOWN N/A 08/13/2022   Procedure: ROBOTIC OSTOMY TAKEDOWN, SMALL BOWEL RESECTION X 2, BILATERAL TAP BLOCK, TISSUE PERFUSSION ASSESMENT VIA FIREFLY, MOBILAZATION OF SPLENIC FLEXURE;  Surgeon: Karie Soda, MD;  Location: [facility];  Service: General;  Laterality: N/A;   XI ROBOTIC ASSISTED LOWER ANTERIOR RESECTION N/A 03/31/2022   Procedure: XI ROBOTIC ASSISTED LOWER ANTERIOR RESECTION;  Surgeon: Karie Soda, MD;  Location: [facility];  Service: General;  Laterality: N/A;   Social History   Tobacco Use   Smoking status: Never   Smokeless tobacco: Never  Vaping Use   Vaping status: Never Used  Substance Use Topics   Alcohol use: No    Alcohol/week: 0.0 standard drinks of alcohol   Drug use: No   Social History   Socioeconomic History   Marital status: Married    Spouse name: Tasia Catchings   Number of children: 3   Years of education: Jr college   Highest education level: Not on file  Occupational History   Occupation: Leisure centre manager: UNEMPLOYED   Occupation: retired  Tobacco Use   Smoking status: Never   Smokeless tobacco: Never  Vaping Use   Vaping status: Never Used  Substance and Sexual Activity   Alcohol use: No    Alcohol/week: 0.0 standard drinks of alcohol   Drug use: No   Sexual activity: Not Currently  Other Topics Concern   Not on file  Social History Narrative   Lives with husband Tasia Catchings   Caffeine use: coffee (2 cups per day)   Mostly right-handed   Social Determinants of Health    Financial Resource Strain: Not on file  Food Insecurity: Patient Declined (11/20/2022)   Hunger Vital Sign    Worried About Running Out of Food in the Last Year: Patient declined    Ran Out of Food in the Last Year: Patient declined  Transportation Needs: No Transportation Needs (11/24/2022)   PRAPARE - Administrator, Civil Service (Medical): No    Lack of Transportation (Non-Medical): No  Physical Activity: Not on file  Stress: Not on file  Social Connections: Not on file  Intimate Partner Violence: Patient Declined (11/20/2022)   Humiliation, Afraid, Rape, and Kick questionnaire    Fear of Current or Ex-Partner: Patient declined    Emotionally Abused: Patient declined  Physically Abused: Patient declined    Sexually Abused: Patient declined   Family Status  Relation Name Status   Mother  Deceased   Father  Deceased   Brother  (Not Specified)   Brother  (Not Specified)   Brother  (Not Specified)   Brother  (Not Specified)   Brother  (Not Specified)   Mat Aunt  (Not Specified)   MGM  (Not Specified)   Other  (Not Specified)   Neg Hx  (Not Specified)  No partnership data on file   Family History  Problem Relation Age of Onset   Ovarian cancer Mother    Uterine cancer Mother    Lung cancer Father    HIV Brother        1982   Kidney cancer Brother    Lung cancer Brother    Other Brother        Mouth Cancer   Colon cancer Maternal Aunt    Heart disease Maternal Grandmother    Stroke Maternal Grandmother    Hypertension Maternal Grandmother    Diabetes Maternal Grandmother    Arthritis Other    Esophageal cancer Neg Hx    Liver disease Neg Hx    Rectal cancer Neg Hx    Stomach cancer Neg Hx    Allergies  Allergen Reactions   Prednisone Other (See Comments)    Changed personality    Dilaudid [Hydromorphone] Itching and Other (See Comments)    Dilaudid caused marked confusion   Atrovent Hfa [Ipratropium Bromide Hfa] Itching and Other (See  Comments)    Pt could not sleep, Insomnia    Cheratussin Ac [Guaifenesin-Codeine] Other (See Comments)    Headaches    Diltiazem Itching and Other (See Comments)    Makes patient sick. Shakes. GI   Hydrocodone Itching   Influenza Vaccines Other (See Comments)    Pt reports heart attack after last flu shot   Morphine Itching   Motrin [Ibuprofen] Itching and Other (See Comments)    "Gives false reading in blood"   Oxycodone Itching   Codeine Itching, Nausea Only and Other (See Comments)    Pt takes promethazine with codeine at home      Review of Systems  Constitutional:  Negative for fever and malaise/fatigue.  HENT:  Negative for congestion.   Eyes:  Negative for blurred vision.  Respiratory:  Negative for cough and shortness of breath.   Cardiovascular:  Negative for chest pain, palpitations and leg swelling.  Gastrointestinal:  Negative for abdominal pain, blood in stool, nausea and vomiting.  Genitourinary:  Negative for dysuria and frequency.  Musculoskeletal:  Negative for back pain and falls.  Skin:  Negative for rash.  Neurological:  Negative for dizziness, loss of consciousness and headaches.  Endo/Heme/Allergies:  Negative for environmental allergies.  Psychiatric/Behavioral:  Negative for depression. The patient is not nervous/anxious.       Objective:     BP (!) 148/90 (BP Location: Left Arm, Patient Position: Sitting, Cuff Size: Normal)   Pulse 81   Temp 97.8 F (36.6 C) (Oral)   Resp 18   Ht 5\' 4"  (1.626 m)   Wt 136 lb 6.4 oz (61.9 kg)   SpO2 97%   BMI 23.41 kg/m  BP Readings from Last 3 Encounters:  02/05/23 (!) 148/90  01/05/23 (!) 150/82  12/21/22 (!) 153/82   Wt Readings from Last 3 Encounters:  02/05/23 136 lb 6.4 oz (61.9 kg)  01/05/23 138 lb (62.6 kg)  12/21/22 137 lb  6.4 oz (62.3 kg)   SpO2 Readings from Last 3 Encounters:  02/05/23 97%  01/05/23 92%  12/21/22 96%      Physical Exam Vitals and nursing note reviewed.   Musculoskeletal:        General: Swelling present.     Right lower leg: 1+ Pitting Edema present.     Right ankle: Swelling present.  Neurological:     General: No focal deficit present.      Results for orders placed or performed in visit on 02/05/23  POC COVID-19  Result Value Ref Range   SARS Coronavirus 2 Ag Negative Negative    Last CBC Lab Results  Component Value Date   WBC 5.9 01/06/2023   HGB 12.4 01/06/2023   HCT 37.9 01/06/2023   MCV 97.8 01/06/2023   MCH 31.3 12/04/2022   RDW 14.9 01/06/2023   PLT 246.0 01/06/2023   Last metabolic panel Lab Results  Component Value Date   GLUCOSE 124 (H) 12/04/2022   NA 140 12/04/2022   K 4.5 12/04/2022   CL 105 12/04/2022   CO2 26 12/04/2022   BUN 16 12/04/2022   CREATININE 0.84 12/04/2022   GFR 64.03 11/26/2022   CALCIUM 9.3 12/04/2022   PHOS 2.4 (L) 04/21/2022   PROT 7.0 12/04/2022   ALBUMIN 3.7 11/26/2022   BILITOT 0.7 12/04/2022   ALKPHOS 58 11/26/2022   AST 17 12/04/2022   ALT 10 12/04/2022   ANIONGAP 11 11/20/2022   Last lipids Lab Results  Component Value Date   CHOL 177 12/04/2022   HDL 44 (L) 12/04/2022   LDLCALC 113 (H) 12/04/2022   TRIG 95 12/04/2022   CHOLHDL 4.0 12/04/2022   Last hemoglobin A1c Lab Results  Component Value Date   HGBA1C 5.4 04/05/2022   Last thyroid functions Lab Results  Component Value Date   TSH 54.64 (H) 12/04/2022   T4TOTAL 11.0 08/22/2021   Last vitamin D Lab Results  Component Value Date   VD25OH 29.9 (L) 05/09/2019   Last vitamin B12 and Folate Lab Results  Component Value Date   VITAMINB12 640 04/18/2022   FOLATE 10.8 04/18/2022   COVID-----negative  The ASCVD Risk score (Arnett DK, et al., 2019) failed to calculate for the following reasons:   The patient has a prior MI or stroke diagnosis    Assessment & Plan:   Problem List Items Addressed This Visit       Unprioritized   IPF (idiopathic pulmonary fibrosis) (HCC) - Primary   Other  Visit Diagnoses     Acute cough       Relevant Medications   promethazine-dextromethorphan (PROMETHAZINE-DM) 6.25-15 MG/5ML syrup   Other Relevant Orders   DG Chest 2 View   POC COVID-19 (Completed)   Lower extremity edema       Relevant Medications   furosemide (LASIX) 20 MG tablet   Other Relevant Orders   Comprehensive metabolic panel     Assessment and Plan    Unilateral leg swelling Acute onset, no associated pain. No recent trauma reported. Recent air travel could be a contributing factor. -Advise patient to take Furosemide 20mg  for a few days to reduce swelling.  Acute cough and nasal congestion Increased cough and nasal congestion following recent air travel. No fever or chest pain reported. Patient is currently using Flonase. -Continue Flonase as needed. -Consider COVID-19 testing due to recent air travel and immune compromised status.  Shortness of breath Increased shortness of breath, possibly related to increased cough and nasal  congestion. Oxygen saturation lower than usual. -Monitor symptoms closely. If symptoms persist or worsen, further evaluation may be needed.  Follow-up -ENT appointment scheduled at [facility] for ongoing nasal congestion and wheezing.        Return if symptoms worsen or fail to improve.    Donato Schultz, DO

## 2023-02-06 ENCOUNTER — Ambulatory Visit (HOSPITAL_BASED_OUTPATIENT_CLINIC_OR_DEPARTMENT_OTHER)
Admission: RE | Admit: 2023-02-06 | Discharge: 2023-02-06 | Disposition: A | Discharge status: Home / Self Care | Payer: Medicare Other | Source: Ambulatory Visit | Attending: Family Medicine | Admitting: Family Medicine

## 2023-02-06 DIAGNOSIS — Z981 Arthrodesis status: Secondary | ICD-10-CM | POA: Diagnosis not present

## 2023-02-06 DIAGNOSIS — R051 Acute cough: Secondary | ICD-10-CM | POA: Insufficient documentation

## 2023-02-06 DIAGNOSIS — J849 Interstitial pulmonary disease, unspecified: Secondary | ICD-10-CM | POA: Diagnosis not present

## 2023-02-06 DIAGNOSIS — R0602 Shortness of breath: Secondary | ICD-10-CM | POA: Diagnosis not present

## 2023-02-06 DIAGNOSIS — R059 Cough, unspecified: Secondary | ICD-10-CM | POA: Diagnosis not present

## 2023-02-08 ENCOUNTER — Telehealth: Payer: Self-pay | Admitting: Family Medicine

## 2023-02-08 ENCOUNTER — Other Ambulatory Visit: Payer: Self-pay | Admitting: Family Medicine

## 2023-02-08 NOTE — Telephone Encounter (Signed)
Pt called stating that the cough syrup that was given to her from her 11.1.24 appt is not helping with her cough and wanted to look at other treatment options. Pt was taking the following cough syrup:  promethazine-dextromethorphan (PROMETHAZINE-DM) 6.25-15 MG/5ML syrup [[id number]]

## 2023-02-08 NOTE — Telephone Encounter (Signed)
Patient called to follow up about her previous call. Advised patient that provider said she could call in tessalon pearls. Pt already has those and says that does not work. Please advise if something else can be sent in.

## 2023-02-09 ENCOUNTER — Ambulatory Visit: Payer: Medicare Other | Admitting: Physician Assistant

## 2023-02-09 DIAGNOSIS — L309 Dermatitis, unspecified: Secondary | ICD-10-CM | POA: Diagnosis not present

## 2023-02-09 DIAGNOSIS — C44319 Basal cell carcinoma of skin of other parts of face: Secondary | ICD-10-CM | POA: Diagnosis not present

## 2023-02-09 DIAGNOSIS — D485 Neoplasm of uncertain behavior of skin: Secondary | ICD-10-CM | POA: Diagnosis not present

## 2023-02-10 ENCOUNTER — Ambulatory Visit: Payer: Medicare Other | Attending: Physician Assistant | Admitting: Physician Assistant

## 2023-02-10 VITALS — BP 140/80 | HR 77 | Ht 63.0 in | Wt 135.0 lb

## 2023-02-10 DIAGNOSIS — G459 Transient cerebral ischemic attack, unspecified: Secondary | ICD-10-CM

## 2023-02-10 DIAGNOSIS — E039 Hypothyroidism, unspecified: Secondary | ICD-10-CM | POA: Diagnosis not present

## 2023-02-10 DIAGNOSIS — Z7689 Persons encountering health services in other specified circumstances: Secondary | ICD-10-CM | POA: Insufficient documentation

## 2023-02-10 DIAGNOSIS — I252 Old myocardial infarction: Secondary | ICD-10-CM

## 2023-02-10 DIAGNOSIS — R011 Cardiac murmur, unspecified: Secondary | ICD-10-CM

## 2023-02-10 DIAGNOSIS — J8410 Pulmonary fibrosis, unspecified: Secondary | ICD-10-CM | POA: Diagnosis not present

## 2023-02-10 DIAGNOSIS — I351 Nonrheumatic aortic (valve) insufficiency: Secondary | ICD-10-CM | POA: Insufficient documentation

## 2023-02-10 DIAGNOSIS — R0609 Other forms of dyspnea: Secondary | ICD-10-CM | POA: Diagnosis not present

## 2023-02-10 DIAGNOSIS — R197 Diarrhea, unspecified: Secondary | ICD-10-CM | POA: Insufficient documentation

## 2023-02-10 DIAGNOSIS — I10 Essential (primary) hypertension: Secondary | ICD-10-CM

## 2023-02-10 DIAGNOSIS — I951 Orthostatic hypotension: Secondary | ICD-10-CM | POA: Insufficient documentation

## 2023-02-10 NOTE — Telephone Encounter (Signed)
Spoke with patient. Pt states taking cough syrup and tessalon perles. Pt states the only thing that has helped before were a shot or antibiotics.

## 2023-02-10 NOTE — Patient Instructions (Signed)
Medication Instructions:  NO CHANGES *If you need a refill on your cardiac medications before your next appointment, please call your pharmacy*   Lab Work: NO LABS If you have labs (blood work) drawn today and your tests are completely normal, you will receive your results only by: MyChart Message (if you have MyChart) OR A paper copy in the mail If you have any lab test that is abnormal or we need to change your treatment, we will call you to review the results.   Testing/Procedures:[address] Your physician has requested that you have a lower or upper extremity venous duplex. This test is an ultrasound of the veins in the legs or arms. It looks at venous blood flow that carries blood from the heart to the legs or arms. Allow one hour for a Lower Venous exam. Allow thirty minutes for an Upper Venous exam. There are no restrictions or special instructions.  Please note: We ask at that you not bring children with you during ultrasound (echo/ vascular) testing. Due to room size and safety concerns, children are not allowed in the ultrasound rooms during exams. Our front office staff cannot provide observation of children in our lobby area while testing is being conducted. An adult accompanying a patient to their appointment will only be allowed in the ultrasound room at the discretion of the ultrasound technician under special circumstances. We apologize for any inconvenience.    Follow-Up: At [facility], you and your health needs are our priority.  As part of our continuing mission to provide you with exceptional heart care, we have created designated Provider Care Teams.  These Care Teams include your primary Cardiologist (physician) and Advanced Practice Providers (APPs -  Physician Assistants and Nurse Practitioners) who all work together to provide you with the care you need, when you need it.   LETTER WILL BE MAILED OUT IN MAY 2025 FOR 1 YEAR APPOINTMENT IN  [place] 2025

## 2023-02-10 NOTE — Progress Notes (Signed)
Cardiology Office Note:  .   Date:  02/12/2023  ID:  SYEDA HADY, DOB [birth year], MRN [id number] PCP: Zola Button, Grayling Congress, DO   [facility] Providers Cardiologist:  Thomasene Ripple, DO     History of Present Illness: .   SHAINAH TALWAR is a 77 y.o. female with PMH of colostomy following partial colectomy for severe diverticulitis, interstitial lung disease, chronic venous insufficiency, asthma, hypothyroidism and reported diagnosis of diastolic heart failure.  Coronary angiography in 2015 showed no coronary artery disease.  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  Heart monitor obtained in 2021 showed episodes of brief paroxysmal atrial tachycardia but no significant bradycardia or advanced AV block.  There was no evidence of atrial fibrillation.  Patient was referred to cardiology service for the evaluation of incidental bradycardia.  She had prolonged recovery from her abdominal infection and the surgery with complicated diverticulitis in December.  She has severe diverticulosis with a pericolic fluid collection consistent with diverticular abscess.  She underwent rectal sigmoid resection with colostomy by Dr. Michaell Cowing on 04/01/2023.  She had new abscess formation that required interventional radiology guided drainage on 04/12/2022.  She also had prolonged ileus that led to TPN and transfusion for anemia.  She eventually had takedown of her colostomy on 11/19/2022.  She had issues with diarrhea and dizziness.  She take Lomotil to help with the diarrhea.  Even prior to colostomy, she had problem with diarrhea related to treatment of pulmonary fibrosis with Ofev.  She had artifactual bradycardia due to frequent PACs and under counting of 2 heart rate.  Last echocardiogram obtained in April 2024 showed normal EF, aortic valve sclerosis without stenosis with  mild to moderate AI, mild dilatation of the ascending aorta measuring at 4.1 cm, grade 1 diastolic dysfunction.  Patient was last seen by Dr. Royann Shivers in September 2024.  She had orthostatic hypotension likely related to hypovolemia due to frequent watery diarrhea.  It was recommended to avoid tight blood pressure control and will tolerate systolic pressure in the 150s.  Dr. Royann Shivers also recommended to avoid medication with negative chronotropic effect such as beta-blocker or calcium channel blocker.  A repeat echocardiogram was recommended in 1 year around September 2025.  Recently, patient was seen by her PCP due to ipsilateral leg swelling and was placed back on 20 mg daily of furosemide.  Patient presents today for follow-up for evaluation of dyspnea on exertion.  Her dyspnea on exertion has been worsening for the past 6 months.  She gets short of breath during strenuous activity.  We ambulated her in the hallway for 3 laps.  O2 saturation was in the low normal range from 89% to 94% the entire time.  Heart rate jumped up to the 130s with mild activity.  Her symptom may have a pulmonary component and related to deconditioning.  Due to recent ipsilateral swelling, I also recommended a venous Doppler to rule out DVT.  If venous Doppler is negative, I would not recommend any further workup.  She denies any exertional chest pain.  She has no orthopnea or PND.  I did not see any lower extremity edema on exam, she is not using the Lasix every day she is only using it a few days a week as needed.  She can follow-up with Dr. Royann Shivers as previously scheduled.  ROS:   Patient has dyspnea  on exertion, she denies any chest pain.  There is ipsilateral lower extremity edema.  She has no orthopnea or PND.  Studies Reviewed: Marland Kitchen   EKG Interpretation Date/Time:  Wednesday February 10 2023 13:37:37 EST Ventricular Rate:  77 PR Interval:  162 QRS Duration:  122 QT Interval:  396 QTC Calculation: 448 R  Axis:   -37  Text Interpretation: Sinus rhythm with Premature atrial complexes with Abberant conduction Left axis deviation Right bundle branch block Confirmed by Azalee Course [id number]) on 02/12/2023 11:30:25 PM    Cardiac Studies & Procedures       ECHOCARDIOGRAM  ECHOCARDIOGRAM COMPLETE 07/09/2022  Narrative ECHOCARDIOGRAM REPORT    Patient Name:   CHARLYN SOMERVILLE Date of Exam: 07/09/2022 Medical Rec #:  [id number]            Height:       64.0 in Accession #:    [id number]           Weight:       139.4 lb Date of Birth:  [birth year]            BSA:          1.678 m Patient Age:    76 years             BP:           138/88 mmHg Patient Gender: F                    HR:           81 bpm. Exam Location:  [address]  Procedure: 2D Echo, Cardiac Doppler and Color Doppler  Indications:    R01.1 Murmur  History:        Patient has prior history of Echocardiogram examinations, most recent 06/19/2020. TIA, Arrythmias:RBBB, Signs/Symptoms:Murmur; Risk Factors:Hypertension.  Sonographer:    Samule Ohm RDCS Referring Phys: [phone] [facility]   Sonographer Comments: Technically difficult study due to poor echo windows. IMPRESSIONS   1. Left ventricular ejection fraction, by estimation, is 55 to 60%. The left ventricle has normal function. The left ventricle has no regional wall motion abnormalities. There is mild concentric left ventricular hypertrophy. Left ventricular diastolic parameters are consistent with Grade I diastolic dysfunction (impaired relaxation). 2. Right ventricular systolic function is normal. The right ventricular size is normal. Tricuspid regurgitation signal is inadequate for assessing PA pressure. 3. The mitral valve is normal in structure. Mild mitral valve regurgitation. No evidence of mitral stenosis. Moderate mitral annular calcification. 4. The aortic valve is tricuspid. There is mild calcification of the aortic valve. Aortic valve regurgitation is mild to  moderate. No aortic stenosis is present. 5. Aortic dilatation noted. There is mild dilatation of the ascending aorta, measuring 42 mm. 6. IVC not visualized. 7. Frequent PACs noted  FINDINGS Left Ventricle: Left ventricular ejection fraction, by estimation, is 55 to 60%. The left ventricle has normal function. The left ventricle has no regional wall motion abnormalities. The left ventricular internal cavity size was normal in size. There is mild concentric left ventricular hypertrophy. Left ventricular diastolic parameters are consistent with Grade I diastolic dysfunction (impaired relaxation).  Right Ventricle: The right ventricular size is normal. No increase in right ventricular wall thickness. Right ventricular systolic function is normal. Tricuspid regurgitation signal is inadequate for assessing PA pressure.  Left Atrium: Left atrial size was normal in size.  Right Atrium: Right atrial size was normal in size.  Pericardium: There is no evidence of  pericardial effusion.  Mitral Valve: The mitral valve is normal in structure. Moderate mitral annular calcification. Mild mitral valve regurgitation. No evidence of mitral valve stenosis.  Tricuspid Valve: The tricuspid valve is normal in structure. Tricuspid valve regurgitation is trivial.  Aortic Valve: The aortic valve is tricuspid. There is mild calcification of the aortic valve. Aortic valve regurgitation is mild to moderate. Aortic regurgitation PHT measures 702 msec. No aortic stenosis is present.  Pulmonic Valve: The pulmonic valve was normal in structure. Pulmonic valve regurgitation is trivial.  Aorta: The aortic root is normal in size and structure and aortic dilatation noted. There is mild dilatation of the ascending aorta, measuring 42 mm.  Venous: The inferior vena cava was not well visualized.  IAS/Shunts: No atrial level shunt detected by color flow Doppler.   LEFT VENTRICLE PLAX 2D LVIDd:         4.70 cm    Diastology LVIDs:         3.30 cm   LV e' medial:    6.53 cm/s LV PW:         0.90 cm   LV E/e' medial:  16.2 LV IVS:        1.50 cm   LV e' lateral:   8.48 cm/s LVOT diam:     2.20 cm   LV E/e' lateral: 12.5 LV SV:         79 LV SV Index:   47 LVOT Area:     3.80 cm   LEFT ATRIUM             Index        RIGHT ATRIUM           Index LA diam:        4.10 cm 2.44 cm/m   RA Pressure: 3.00 mmHg LA Vol (A2C):   34.8 ml 20.74 ml/m  RA Area:     9.99 cm LA Vol (A4C):   38.2 ml 22.76 ml/m  RA Volume:   17.60 ml  10.49 ml/m LA Biplane Vol: 38.8 ml 23.12 ml/m AORTIC VALVE LVOT Vmax:   103.90 cm/s LVOT Vmean:  63.700 cm/s LVOT VTI:    0.207 m AI PHT:      702 msec  AORTA Ao Root diam: 3.40 cm Ao Asc diam:  4.20 cm  MITRAL VALVE                TRICUSPID VALVE MV Area (PHT): 3.07 cm     Estimated RAP:  3.00 mmHg MV Decel Time: 247 msec MV E velocity: 105.72 cm/s  SHUNTS MV A velocity: 102.40 cm/s  Systemic VTI:  0.21 m MV E/A ratio:  1.03         Systemic Diam: 2.20 cm  Dalton McleanMD Electronically signed by Wilfred Lacy Signature Date/Time: 07/09/2022/2:41:55 PM    Final    MONITORS  LONG TERM MONITOR (3-14 DAYS) 01/12/2020  Narrative The patient wore the monitor for 12 days 21 hours starting  12/24/2019 . Indication: Dizziness  The minimum heart rate was 43 bpm, maximum heart rate was 171 bpm, and average heart rate was 68 bpm. Predominant underlying rhythm was Sinus Rhythm.   Rhythm. Bundle Branch Block/IVCD was present.  15 Supraventricular Tachycardia runs occurred, the run with the fastest interval lasting 6 beats with a max rate of 171bpm, the longest lasting 10 beats with an avg rate of 103 bpm.   Premature atrial complexes  were rare (<1.0%). Premature Ventricular complexes  were  rare (<1.0%). No ventricular tachycardia, no pauses, No AV block and no atrial fibrillation present.  12 patient triggered events:  1 associated with Supraventricular  tachycardia, 3 associated with premature atrial complex and the remaining associated with sinus rhythm. 2 diary events noted all associated with sinus rhythm.  Conclusion: This study is remarkable for supraventricular tachycardia which is likely atrial tachycardia with variable block.   CT SCANS  CT CORONARY MORPH W/CTA COR W/SCORE 10/06/2019  Addendum 10/06/2019  1:14 PM ADDENDUM REPORT: 10/06/2019 13:12  CLINICAL DATA:  77 year female with shortness of breath. Medical history includes hypertension and TIA.  EXAM: Cardiac/Coronary  CT  TECHNIQUE: The patient was scanned on a [employer].  FINDINGS: A 120 kV prospective scan was triggered in the descending thoracic aorta at 111 HU's. Axial non-contrast 3 mm slices were carried out through the heart. The data set was analyzed on a dedicated work station and scored using the Agatson method. Gantry rotation speed was 250 msecs and collimation was .6 mm. No beta blockade and 0.8 mg of sl NTG was given. The 3D data set was reconstructed in 5% intervals of the 67-82 % of the R-R cycle. Diastolic phases were analyzed on a dedicated work station using MPR, MIP and VRT modes. The patient received 80 cc of contrast.  Aorta: Dilated proximal ascending aorta (40.4 mm). There is calcifications noted in the descending aorta. No dissection.  Aortic Valve:  Trileaflet.  No calcifications.  Coronary Arteries:  Normal coronary origin.  Right dominance.  RCA is a large dominant artery that gives rise to PDA and PLVB. There is no plaque.  Left main is a large artery that gives rise to LAD, Intermedius Ramus and LCX arteries.  LAD is a large vessel that has no plaque. There is very mild myocardial bridging of the mid to distal LAD.  Intermedius Ramus with no plaque.  LCX is a non-dominant artery that gives rise to one large OM1 branch. There is no plaque.  Other findings:  Normal pulmonary vein drainage into the left  atrium.  Normal left atrial appendage without a thrombus.  Normal size of the pulmonary artery.  Mild mitral annular calcification.  IMPRESSION: 1. Coronary calcium score of 0. This was 0 percentile for age and sex matched control.  2. Normal coronary origin with right dominance.  3. No evidence of CAD.  4.  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  5.  Mild mitral annular calcification.  5.  Aortic Atherosclerosis.  Thomasene Ripple, DO   Electronically Signed By: Thomasene Ripple MD On: 10/06/2019 13:12  Narrative EXAM: OVER-READ INTERPRETATION  CT CHEST  The following report is an over-read performed by radiologist Dr. Trudie Reed of [facility] on 10/06/2019. This over-read does not include interpretation of cardiac or coronary anatomy or pathology. The coronary calcium score/coronary CTA interpretation by the cardiologist is attached.  COMPARISON:  None.  FINDINGS: Aortic atherosclerosis with ectasia of the ascending thoracic aorta (4.1 cm in diameter). Widespread areas of peripheral predominant septal thickening noted throughout the lungs bilaterally. Within the visualized portions of the thorax there are no suspicious appearing pulmonary nodules or masses, there is no acute consolidative airspace disease, no pleural effusions, no pneumothorax and no lymphadenopathy. Visualized portions of the upper abdomen are unremarkable. There are no aggressive appearing lytic or blastic lesions noted in the visualized portions of the skeleton.  IMPRESSION: 1.  Aortic Atherosclerosis (ICD10-I70.0). 2. 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  Recommend annual imaging followup by CTA or MRA. This recommendation follows 2010 ACCF/AHA/AATS/ACR/ASA/SCA/SCAI/SIR/STS/SVM Guidelines for the Diagnosis and Management of Patients with Thoracic Aortic Disease. Circulation. 2010; 121: W098-J191. Aortic aneurysm NOS (ICD10-I71.9). 3. 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  Electronically Signed: By: Trudie Reed M.D. On: 10/06/2019 12:26          Risk Assessment/Calculations:            Physical Exam:   VS:  BP (!) 140/80 (BP Location: Right Arm, Patient Position: Sitting, Cuff Size: Normal)   Pulse 77   Ht 5\' 3"  (1.6 m)   Wt 135 lb (61.2 kg)   BMI 23.91 kg/m    Wt Readings from Last 3 Encounters:  02/10/23 135 lb (61.2 kg)  02/05/23 136 lb 6.4 oz (61.9 kg)  01/05/23 138 lb (62.6 kg)    GEN: Well nourished, well developed in no acute distress NECK: No JVD; No carotid bruits CARDIAC: RRR, no murmurs, rubs, gallops RESPIRATORY:  Clear to auscultation without rales, wheezing or rhonchi  ABDOMEN: Soft, non-tender, non-distended EXTREMITIES:  No edema; No deformity   ASSESSMENT AND PLAN: .     Shortness of Breath Gradual worsening over the past 6 months, exacerbated by physical activity. No chest pain or palpitations. EKG shows normal sinus rhythm with PACs and right bundle branch block. Echocardiogram from April 2024 showed normal ejection fraction (55-60%) and minimal mitral valve leakage. No significant valve issues. Coronary CT from 2021 showed normal coronary arteries. -Order venous Doppler to rule out DVT due to unilateral leg swelling. -Continue follow-up with Dr. Royann Shivers as planned in September 2025 if venous Doppler is negative.  Hypothyroidism Recent blood work showed high TSH, indicating low free T4. Patient is currently on 112 mcg of levothyroxine managed by Dr. Shayne Alken. -Recommend follow-up with primary care physician, Dr. Laury Axon, for adjustment of levothyroxine dosage.  Diarrhea Ongoing issue, possibly related to treatment for pulmonary fibrosis with Ofev. Patient has been using Lomotil for symptom management. Recent history of  GI surgery (colostomy takedown on 11/19/2022) following complicated diverticulitis. -Continue Lomotil as needed for diarrhea control. -Recommend follow-up with GI specialist for ongoing management.  Aortic insufficiency: Seen on previous echocardiogram in April 2024.  Mild to moderate aortic insufficiency.  Orthostatic Hypotension Likely related to hypovolemia due to frequent watery diarrhea. Previously recommended to avoid strict blood pressure control and tolerate systolic pressures in the 150s. -Continue current management plan, avoiding medications with negative chronotropic effects such as beta blockers or calcium channel blockers.  Unilateral Leg Swelling Recent onset, patient has history of DVT. Currently on intermittent furosemide as prescribed by primary care physician. -Order venous Doppler to rule out DVT. -Continue intermittent furosemide as prescribed by primary care physician.  Interstitial Pulmonary Fibrosis Managed with Ofev. Recent visit with pulmonologist showed satisfactory oxygen levels (98%). -Continue Ofev as prescribed. -Recommend regular follow-up with pulmonologist for ongoing management.       Dispo: Follow-up with Dr. Royann Shivers as previously scheduled  Signed, Azalee Course, PA

## 2023-02-11 ENCOUNTER — Other Ambulatory Visit: Payer: Self-pay | Admitting: Family Medicine

## 2023-02-11 DIAGNOSIS — J84112 Idiopathic pulmonary fibrosis: Secondary | ICD-10-CM

## 2023-02-11 MED ORDER — AZITHROMYCIN 250 MG PO TABS: ORAL_TABLET | ORAL | 0 refills | Status: DC

## 2023-02-12 ENCOUNTER — Telehealth: Payer: Self-pay | Admitting: Family Medicine

## 2023-02-12 DIAGNOSIS — R059 Cough, unspecified: Secondary | ICD-10-CM | POA: Diagnosis not present

## 2023-02-12 DIAGNOSIS — I700 Atherosclerosis of aorta: Secondary | ICD-10-CM | POA: Diagnosis not present

## 2023-02-12 DIAGNOSIS — B9689 Other specified bacterial agents as the cause of diseases classified elsewhere: Secondary | ICD-10-CM | POA: Diagnosis not present

## 2023-02-12 DIAGNOSIS — Z981 Arthrodesis status: Secondary | ICD-10-CM | POA: Diagnosis not present

## 2023-02-12 DIAGNOSIS — J208 Acute bronchitis due to other specified organisms: Secondary | ICD-10-CM | POA: Diagnosis not present

## 2023-02-12 DIAGNOSIS — J8410 Pulmonary fibrosis, unspecified: Secondary | ICD-10-CM | POA: Diagnosis not present

## 2023-02-12 DIAGNOSIS — R0600 Dyspnea, unspecified: Secondary | ICD-10-CM | POA: Diagnosis not present

## 2023-02-12 NOTE — Telephone Encounter (Signed)
Pt just wanted to let pcp know she is not getting any better, still coughing and woke in a pool of sweat today. Please advise with recommendations.

## 2023-02-12 NOTE — Telephone Encounter (Signed)
Pt called. LVM asking if patient picked or started antibiotic.

## 2023-02-12 NOTE — Telephone Encounter (Signed)
Noted. VM left regarding medication

## 2023-02-16 ENCOUNTER — Ambulatory Visit (HOSPITAL_COMMUNITY)
Admission: RE | Admit: 2023-02-16 | Discharge: 2023-02-16 | Disposition: A | Discharge status: Home / Self Care | Payer: Medicare Other | Source: Ambulatory Visit | Attending: Cardiology | Admitting: Cardiology

## 2023-02-16 ENCOUNTER — Telehealth: Payer: Self-pay | Admitting: Family Medicine

## 2023-02-16 DIAGNOSIS — R600 Localized edema: Secondary | ICD-10-CM | POA: Insufficient documentation

## 2023-02-16 DIAGNOSIS — Z7689 Persons encountering health services in other specified circumstances: Secondary | ICD-10-CM | POA: Insufficient documentation

## 2023-02-16 DIAGNOSIS — M7989 Other specified soft tissue disorders: Secondary | ICD-10-CM | POA: Insufficient documentation

## 2023-02-16 NOTE — Telephone Encounter (Signed)
Pt called and stated that her sxs of coughing has not improved. She requested to be tested for RSV. I offered to schedule an OV. However, pt preferred for a message to be sent back instead. Please call and advise.

## 2023-02-17 ENCOUNTER — Other Ambulatory Visit: Payer: Medicare Other

## 2023-02-17 ENCOUNTER — Other Ambulatory Visit: Payer: Self-pay

## 2023-02-17 DIAGNOSIS — R051 Acute cough: Secondary | ICD-10-CM

## 2023-02-17 NOTE — Progress Notes (Signed)
Pt came in and stated she was here for RSV test. Appt notes said RSV/Covid/Flu. Pt stated she had already tested negative for COVID and flu on 11/8.  Notified CMA and was told to just order the RSV. Pt was swabbed by CMA in clinic.

## 2023-02-17 NOTE — Telephone Encounter (Signed)
Pt just needs a lab appointment with "RSV test" in the note please

## 2023-02-17 NOTE — Telephone Encounter (Signed)
Pt called back to follow up. Please advise when order can be placed to schedule with lab.

## 2023-02-19 NOTE — Telephone Encounter (Signed)
Pt called to advise that she is still not feeling well. She said that she is still coughing and the coughing is making her pee her pants. Pt not sure what to do or what to take. Please call her to advise.

## 2023-02-20 LAB — RESPIRATORY VIRUS PANEL
Adenovirus B: NOT DETECTED
HUMAN PARAINFLU VIRUS 1: NOT DETECTED
HUMAN PARAINFLU VIRUS 2: NOT DETECTED
HUMAN PARAINFLU VIRUS 3: NOT DETECTED
INFLUENZA A SUBTYPE H1: NOT DETECTED
INFLUENZA A SUBTYPE H3: NOT DETECTED
Influenza A: NOT DETECTED
Influenza B: NOT DETECTED
Metapneumovirus: NOT DETECTED
Respiratory Syncytial Virus A: NOT DETECTED
Respiratory Syncytial Virus B: NOT DETECTED
Rhinovirus: DETECTED — AB

## 2023-02-22 ENCOUNTER — Telehealth: Payer: Self-pay | Admitting: Family Medicine

## 2023-02-22 NOTE — Telephone Encounter (Signed)
Patient called to leave another message about her lab results and that she still does not know what to do about her coughing. Advised that dr. Laury Axon has not had a chance to review the results yet but that from her call on Friday, pt should schedule appt or go to UC. No appts available today in our office at time of call so pt will go to UC. She was advised if she goes to [facility] they can see what has been done by her provider. Pt understands.

## 2023-02-22 NOTE — Telephone Encounter (Signed)
Pt called and stated that she was seen for a bad cold recently and requested to have a prescription sent in for benzonatate (TESSALON PERLES) 100 MG capsule to [facility] on [address]. She explained that OTC medication has not been working. Please call and advise.

## 2023-02-22 NOTE — Telephone Encounter (Signed)
Noted. Labs not viewed by Laury Axon yet

## 2023-02-23 ENCOUNTER — Other Ambulatory Visit: Payer: Self-pay | Admitting: Family Medicine

## 2023-02-23 DIAGNOSIS — R051 Acute cough: Secondary | ICD-10-CM

## 2023-02-23 MED ORDER — BENZONATATE 100 MG PO CAPS: 200.0000 mg | ORAL_CAPSULE | Freq: Three times a day (TID) | ORAL | 1 refills | Status: DC | PRN

## 2023-02-24 DIAGNOSIS — J3489 Other specified disorders of nose and nasal sinuses: Secondary | ICD-10-CM | POA: Diagnosis not present

## 2023-02-26 ENCOUNTER — Encounter (HOSPITAL_COMMUNITY): Payer: Medicare Other

## 2023-03-02 ENCOUNTER — Encounter: Payer: Self-pay | Admitting: Family Medicine

## 2023-03-02 ENCOUNTER — Ambulatory Visit (INDEPENDENT_AMBULATORY_CARE_PROVIDER_SITE_OTHER): Payer: Medicare Other | Admitting: Family Medicine

## 2023-03-02 VITALS — BP 152/80 | HR 83 | Temp 98.0°F | Resp 18 | Ht 64.0 in | Wt 131.8 lb

## 2023-03-02 DIAGNOSIS — E039 Hypothyroidism, unspecified: Secondary | ICD-10-CM | POA: Diagnosis not present

## 2023-03-02 DIAGNOSIS — R053 Chronic cough: Secondary | ICD-10-CM

## 2023-03-02 MED ORDER — PROMETHAZINE-DM 6.25-15 MG/5ML PO SYRP: 5.0000 mL | ORAL_SOLUTION | Freq: Four times a day (QID) | ORAL | 0 refills | Status: DC | PRN

## 2023-03-02 MED ORDER — LEVOTHYROXINE SODIUM 125 MCG PO TABS: 125.0000 ug | ORAL_TABLET | Freq: Every day | ORAL | 3 refills | Status: DC

## 2023-03-02 MED ORDER — BENZONATATE 200 MG PO CAPS: 200.0000 mg | ORAL_CAPSULE | Freq: Three times a day (TID) | ORAL | 3 refills | Status: DC | PRN

## 2023-03-02 NOTE — Progress Notes (Signed)
Established Patient Office Visit  Subjective   Patient ID: Alexandria [name], female    DOB: [birth year]  Age: 77 y.o. MRN: [id number]  Chief Complaint  Patient presents with   Cough    Cough x 1 year or more. She does have some wheezing. Cough is productive. She wonders if taking the Lomotil for her GI tract is causing this. Her appetite is gone because of the phlegm.     HPI Discussed the use of AI scribe software for clinical note transcription with the patient, who gave verbal consent to proceed.  History of Present Illness   The patient, with a history of chronic cough and IPF, presents with persistent coughing that has been causing significant distress. She reports that the cough has not improved and is associated with wheezing, particularly after bouts of intense coughing. The patient has been self-medicating with promethazine, which she found in her possession from a previous prescription. She reports that this medication has been more effective than others in managing her cough, despite causing some stomach upset.  The patient also reports a recent weight loss, which she attributes to a decreased appetite due to the persistent cough. She describes feeling physically unwell after eating and expresses a desire to be able to expectorate more effectively to relieve her discomfort.  In addition to her respiratory issues, the patient reports a history of a deviated septum surgery, which she believes was not successful. She reports difficulty breathing through one side of her nose and has sought a second opinion from an ENT specialist at [facility]. The ENT specialist has identified issues with the previous surgery and has scheduled a corrective procedure for February.  The patient also mentions concerns about her back, which she believes has been affected by carrying a bag for an extended period. She expresses a desire to seek chiropractic treatment for this issue.      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   Dehydration 08/26/2022   Irritant contact dermatitis associated with digestive stoma 08/26/2022   Ileostomy in place [facility]) 08/26/2022   High output ileostomy (HCC) 08/25/2022   Diastolic dysfunction 08/14/2022   Chronic facial pain 08/14/2022   Dyspepsia 08/14/2022   Blowout of rectal stump (HCC) 08/14/2022   Diverticulitis of colon 08/13/2022   Colostomy care (HCC) 07/31/2022   Bronchospasm 06/16/2022   Temporal arteritis (HCC) 06/16/2022   Chronic obstructive pulmonary disease with (acute) lower respiratory infection (HCC) 06/16/2022   Angina pectoris with documented spasm (HCC) 06/16/2022   Abdominal aortic aneurysm (AAA) without rupture (HCC) 06/16/2022   Right ankle sprain 06/09/2022   Sprain of right ankle 06/01/2022   Dizziness 06/01/2022   Rectal abscess 06/01/2022   Acute right ankle pain 05/19/2022   History of low anterior resection of rectum 05/04/2022   Irritant contact dermatitis associated with fecal stoma 05/02/2022   Slow transit constipation 05/02/2022   Colostomy complication (HCC) 05/02/2022   IDA (iron deficiency anemia) 04/19/2022   Intra-abdominal abscess (  HCC) 04/18/2022   Acute blood loss anemia 04/17/2022   Chronic diastolic CHF (congestive heart failure) (HCC) 04/17/2022   Toxic metabolic encephalopathy 04/17/2022   Ileus following gastrointestinal surgery (HCC) 04/17/2022    Pelvic abscess in female 03/30/2022   Flat foot 11/19/2021   S/P total knee arthroplasty, left 11/13/2021   Ankle impingement syndrome, left 11/13/2021   Rectal bleeding 08/22/2021   Abnormal thyroid blood test 08/22/2021   Abnormal kidney function 08/22/2021   Dumping syndrome 03/13/2020   Arthritis    Bronchial pneumonia    Colon polyp    DVT (deep venous thrombosis) (HCC)    Heart murmur    Migraines    Pancreatitis    Thyroid disease    Aortic root dilatation (HCC) 11/24/2019   Leukocytosis 11/06/2019   Generalized abdominal pain 11/06/2019   Pre-ulcerative corn or callous 09/07/2019   Nausea 09/07/2019   Pain of left calf 05/09/2019   Tibial pain 05/09/2019   Essential hypertension 02/08/2019   Healthcare maintenance 11/10/2018   Bradycardia 07/22/2018   Pansinusitis 06/23/2018   History of transient ischemic attack (TIA) 03/24/2018   IPF (idiopathic pulmonary fibrosis) (HCC) 03/24/2018   Chronic rhinitis 02/16/2018   Eustachian tube dysfunction, bilateral 02/16/2018   URI (upper respiratory infection) 02/16/2018   Seasonal allergic rhinitis due to pollen 02/16/2018   ILD (interstitial lung disease) (HCC) 01/07/2018   Dyspnea 01/07/2018   Acute respiratory failure (HCC) 01/06/2018   TIA (transient ischemic attack) 01/06/2018   Hypokalemia 12/15/2017   Chronic cough 11/26/2017   Basal cell carcinoma (BCC) of neck 06/07/2017   Anisometropia 06/02/2017   Drusen of macula of both eyes 06/02/2017   Photopsia of left eye 06/02/2017   Cardiac murmur 05/07/2017   Situational anxiety 05/07/2017   Headache 05/07/2017   Mitral and aortic insufficiency 05/07/2017   Migraine without status migrainosus, not intractable 06/09/2016   Insomnia 11/18/2015   Normal coronary arteries 05/20/2015   RBBB 05/20/2015   Abnormal CT of the chest 05/20/2015   Hx of myocardial infarction 05/08/2015   Bruising 05/08/2015   Upper airway cough syndrome 02/14/2015   Asthmatic bronchitis  with acute exacerbation 02/06/2015   Mild intermittent asthma without complication 02/06/2015   Oliguria 12/28/2014   Hypothyroidism 10/25/2014   Fatigue 10/25/2014   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   Chronic venous insufficiency 10/24/2014   Varicose veins of leg with complications 10/24/2014   Degeneration of intervertebral disc of cervical region 06/29/2014   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   Generalized osteoarthritis of multiple sites 06/29/2014   History of stroke without residual deficits 06/29/2014   Palpitations 11/08/2013   Edema of lower extremity 10/03/2013   Hair loss 11/25/2012   Alopecia 11/25/2012   Diverticulitis of large intestine with perforation and abscess 07/14/2012   Hammer toe 06/13/2012   History of stress test 05/21/2011   Hx of echocardiogram 01/27/2010   H/O cardiac catheterization 2004   Past Medical History:  Diagnosis Date   Arthritis    Bronchial pneumonia    Chronic facial pain 08/14/2022   Colon polyp    Diverticulitis    DVT (deep venous thrombosis) (HCC)    lower extremity   H/O cardiac catheterization 2004   Normal coronary arteries   Heart murmur    History of stress test 05/21/2011   Hx of echocardiogram 01/27/2010   Normal Ef 55% the transmitral spectral doppler flow pattern is normal for age. the left ventricular wall motion is normal   Hypothyroidism  IPF (idiopathic pulmonary fibrosis) (HCC) 01/2018   Myocardial infarction (HCC)    hx of 30 years ago   Pancreatitis    TIA (transient ischemic attack)    8 years ago   Past Surgical History:  Procedure Laterality Date   BREAST BIOPSY Left    [facility]   CARDIAC CATHETERIZATION     10/2013   CATARACT EXTRACTION Bilateral 03/22/2018   FLEXIBLE SIGMOIDOSCOPY N/A 08/13/2022   Procedure: FLEXIBLE SIGMOIDOSCOPY;  Surgeon: Karie Soda, MD;  Location: [facility];  Service: General;  Laterality: N/A;   ILEOSTOMY CLOSURE N/A 11/19/2022   Procedure: OPEN  TAKEDOWN OF LOOP ILEOSTOMY;  Surgeon: Karie Soda, MD;  Location: [facility];  Service: General;  Laterality: N/A;   LEFT HEART CATHETERIZATION WITH CORONARY ANGIOGRAM N/A 10/25/2013   Procedure: LEFT HEART CATHETERIZATION WITH CORONARY ANGIOGRAM;  Surgeon: Lennette Bihari, MD;  Location: [facility] CATH LAB;  Service: Cardiovascular;  Laterality: N/A;   LYSIS OF ADHESION N/A 08/13/2022   Procedure: LYSIS OF ADHESION, DIVERTING ILEOSTOMY;  Surgeon: Karie Soda, MD;  Location: [facility];  Service: General;  Laterality: N/A;   NECK SURGERY     ACDF x 2      done in [place] more than 25 years, doesn't know levels   TOTAL KNEE ARTHROPLASTY     Bilateral x's 2   VAGINAL HYSTERECTOMY  10/17/1998   Konrad Dolores   VIDEO BRONCHOSCOPY Bilateral 01/24/2018   Procedure: VIDEO BRONCHOSCOPY WITH FLUORO;  Surgeon: Lupita Leash, MD;  Location: [facility] ENDOSCOPY;  Service: Cardiopulmonary;  Laterality: Bilateral;   XI ROBOTIC ASSISTED COLOSTOMY TAKEDOWN N/A 08/13/2022   Procedure: ROBOTIC OSTOMY TAKEDOWN, SMALL BOWEL RESECTION X 2, BILATERAL TAP BLOCK, TISSUE PERFUSSION ASSESMENT VIA FIREFLY, MOBILAZATION OF SPLENIC FLEXURE;  Surgeon: Karie Soda, MD;  Location: [facility];  Service: General;  Laterality: N/A;   XI ROBOTIC ASSISTED LOWER ANTERIOR RESECTION N/A 03/31/2022   Procedure: XI ROBOTIC ASSISTED LOWER ANTERIOR RESECTION;  Surgeon: Karie Soda, MD;  Location: [facility];  Service: General;  Laterality: N/A;   Social History   Tobacco Use   Smoking status: Never   Smokeless tobacco: Never  Vaping Use   Vaping status: Never Used  Substance Use Topics   Alcohol use: No    Alcohol/week: 0.0 standard drinks of alcohol   Drug use: No   Social History   Socioeconomic History   Marital status: Married    Spouse name: Tasia Catchings   Number of children: 3   Years of education: Jr college   Highest education level: Not on file  Occupational History   Occupation: Leisure centre manager: UNEMPLOYED   Occupation: retired   Tobacco Use   Smoking status: Never   Smokeless tobacco: Never  Vaping Use   Vaping status: Never Used  Substance and Sexual Activity   Alcohol use: No    Alcohol/week: 0.0 standard drinks of alcohol   Drug use: No   Sexual activity: Not Currently  Other Topics Concern   Not on file  Social History Narrative   Lives with husband Tasia Catchings   Caffeine use: coffee (2 cups per day)   Mostly right-handed   Social Determinants of Health   Financial Resource Strain: Not on file  Food Insecurity: Patient Declined (11/20/2022)   Hunger Vital Sign    Worried About Running Out of Food in the Last Year: Patient declined    Ran Out of Food in the Last Year: Patient declined  Transportation Needs: No Transportation Needs (11/24/2022)  PRAPARE - Administrator, Civil Service (Medical): No    Lack of Transportation (Non-Medical): No  Physical Activity: Not on file  Stress: Not on file  Social Connections: Not on file  Intimate Partner Violence: Patient Declined (11/20/2022)   Humiliation, Afraid, Rape, and Kick questionnaire    Fear of Current or Ex-Partner: Patient declined    Emotionally Abused: Patient declined    Physically Abused: Patient declined    Sexually Abused: Patient declined   Family Status  Relation Name Status   Mother  Deceased   Father  Deceased   Brother  (Not Specified)   Brother  (Not Specified)   Brother  (Not Specified)   Brother  (Not Specified)   Brother  (Not Specified)   Mat Aunt  (Not Specified)   MGM  (Not Specified)   Other  (Not Specified)   Neg Hx  (Not Specified)  No partnership data on file   Family History  Problem Relation Age of Onset   Ovarian cancer Mother    Uterine cancer Mother    Lung cancer Father    HIV Brother        1982   Kidney cancer Brother    Lung cancer Brother    Other Brother        Mouth Cancer   Colon cancer Maternal Aunt    Heart disease Maternal Grandmother    Stroke Maternal Grandmother     Hypertension Maternal Grandmother    Diabetes Maternal Grandmother    Arthritis Other    Esophageal cancer Neg Hx    Liver disease Neg Hx    Rectal cancer Neg Hx    Stomach cancer Neg Hx    Allergies  Allergen Reactions   Prednisone Other (See Comments)    Changed personality    Dilaudid [Hydromorphone] Itching and Other (See Comments)    Dilaudid caused marked confusion   Atrovent Hfa [Ipratropium Bromide Hfa] Itching and Other (See Comments)    Pt could not sleep, Insomnia    Cheratussin Ac [Guaifenesin-Codeine] Other (See Comments)    Headaches    Diltiazem Itching and Other (See Comments)    Makes patient sick. Shakes. GI   Hydrocodone Itching   Influenza Vaccines Other (See Comments)    Pt reports heart attack after last flu shot   Morphine Itching   Motrin [Ibuprofen] Itching and Other (See Comments)    "Gives false reading in blood"   Oxycodone Itching   Codeine Itching, Nausea Only and Other (See Comments)    Pt takes promethazine with codeine at home      Review of Systems  Constitutional:  Negative for fever and malaise/fatigue.  HENT:  Negative for congestion.   Eyes:  Negative for blurred vision.  Respiratory:  Positive for cough and wheezing. Negative for shortness of breath.   Cardiovascular:  Negative for chest pain, palpitations and leg swelling.  Gastrointestinal:  Negative for abdominal pain, blood in stool and nausea.  Genitourinary:  Negative for dysuria and frequency.  Musculoskeletal:  Negative for falls.  Skin:  Negative for rash.  Neurological:  Negative for dizziness, loss of consciousness and headaches.  Endo/Heme/Allergies:  Negative for environmental allergies.  Psychiatric/Behavioral:  Negative for depression. The patient is not nervous/anxious.       Objective:     BP (!) 152/80 (BP Location: Left Arm, Patient Position: Sitting, Cuff Size: Normal)   Pulse 83   Temp 98 F (36.7 C) (Temporal)   Resp 18  Ht 5\' 4"  (1.626 m)   Wt 131  lb 12.8 oz (59.8 kg)   SpO2 97%   BMI 22.62 kg/m  BP Readings from Last 3 Encounters:  03/02/23 (!) 152/80  02/10/23 (!) 140/80  02/05/23 (!) 148/90   Wt Readings from Last 3 Encounters:  03/02/23 131 lb 12.8 oz (59.8 kg)  02/10/23 135 lb (61.2 kg)  02/05/23 136 lb 6.4 oz (61.9 kg)   SpO2 Readings from Last 3 Encounters:  03/02/23 97%  02/05/23 97%  01/05/23 92%      Physical Exam Vitals and nursing note reviewed.  Constitutional:      General: She is not in acute distress.    Appearance: Normal appearance. She is well-developed.  HENT:     Head: Normocephalic and atraumatic.  Eyes:     General: No scleral icterus.       Right eye: No discharge.        Left eye: No discharge.  Cardiovascular:     Rate and Rhythm: Normal rate and regular rhythm.     Heart sounds: No murmur heard. Pulmonary:     Effort: Pulmonary effort is normal. No respiratory distress.     Breath sounds: Wheezing present.  Musculoskeletal:        General: Normal range of motion.     Cervical back: Normal range of motion and neck supple.     Right lower leg: No edema.     Left lower leg: No edema.  Skin:    General: Skin is warm and dry.  Neurological:     Mental Status: She is alert and oriented to person, place, and time.  Psychiatric:        Mood and Affect: Mood normal.        Behavior: Behavior normal.        Thought Content: Thought content normal.        Judgment: Judgment normal.      No results found for any visits on 03/02/23.  Last CBC Lab Results  Component Value Date   WBC 5.9 01/06/2023   HGB 12.4 01/06/2023   HCT 37.9 01/06/2023   MCV 97.8 01/06/2023   MCH 31.3 12/04/2022   RDW 14.9 01/06/2023   PLT 246.0 01/06/2023   Last metabolic panel Lab Results  Component Value Date   GLUCOSE 92 02/05/2023   NA 140 02/05/2023   K 3.9 02/05/2023   CL 102 02/05/2023   CO2 29 02/05/2023   BUN 11 02/05/2023   CREATININE 0.88 02/05/2023   GFR 64.03 11/26/2022   CALCIUM  9.8 02/05/2023   PHOS 2.4 (L) 04/21/2022   PROT 7.5 02/05/2023   ALBUMIN 3.7 11/26/2022   BILITOT 0.6 02/05/2023   ALKPHOS 58 11/26/2022   AST 17 02/05/2023   ALT 9 02/05/2023   ANIONGAP 11 11/20/2022   Last lipids Lab Results  Component Value Date   CHOL 177 12/04/2022   HDL 44 (L) 12/04/2022   LDLCALC 113 (H) 12/04/2022   TRIG 95 12/04/2022   CHOLHDL 4.0 12/04/2022   Last hemoglobin A1c Lab Results  Component Value Date   HGBA1C 5.4 04/05/2022   Last thyroid functions Lab Results  Component Value Date   TSH 54.64 (H) 12/04/2022   T4TOTAL 11.0 08/22/2021   Last vitamin D Lab Results  Component Value Date   VD25OH 29.9 (L) 05/09/2019   Last vitamin B12 and Folate Lab Results  Component Value Date   VITAMINB12 640 04/18/2022   FOLATE 10.8 04/18/2022  The ASCVD Risk score (Arnett DK, et al., 2019) failed to calculate for the following reasons:   The patient has a prior MI or stroke diagnosis    Assessment & Plan:   Problem List Items Addressed This Visit       Unprioritized   Chronic cough - Primary   Relevant Medications   benzonatate (TESSALON) 200 MG capsule   promethazine-dextromethorphan (PROMETHAZINE-DM) 6.25-15 MG/5ML syrup   Hypothyroidism   Relevant Medications   levothyroxine (SYNTHROID) 125 MCG tablet  Assessment and Plan    Chronic Cough   She finds relief from her persistent cough and associated wheezing with promethazine, despite its side effects of stomach upset. She prefers promethazine for its effectiveness and also uses cough pearls, recently renewed at a lower dose. We will continue promethazine as prescribed and maintain the cough pearls at a 100 mg dose.  Idiopathic Pulmonary Fibrosis (IPF)   Her IPF is well-managed, with no new symptoms or exacerbations reported, according to her pulmonologist. We will continue the current management strategy for IPF and follow up with the pulmonologist as scheduled.  Deviated Septum   Her  previous repair for a deviated septum was unsuccessful, leading to persistent nasal obstruction and headaches. An ENT at [facility] plans corrective surgery in February, and we discussed transportation options. We will schedule the surgery for deviated septum correction in February, arrange transportation for the procedure, and consider using Benedetto Goad with a companion if hospital policy allows.  Back Pain   Her chronic back pain is likely exacerbated by poor posture from carrying a bag for nine months. We will refer her to Dr. Christell Faith at [facility] for evaluation and treatment.  Hypothyroidism   She requires a renewal of her thyroid medication prescription. We will renew the thyroid medication prescription.  General Health Maintenance   We discussed the need for prescription renewals and managing chronic conditions. We will renew all necessary prescriptions and encourage regular follow-ups for chronic conditions.  Follow-up   We will follow up with ENT for a post-surgery evaluation, continue regular follow-ups with the pulmonologist for IPF, and schedule follow-up appointments as needed for chronic conditions.        No follow-ups on file.    Donato Schultz, DO

## 2023-03-03 ENCOUNTER — Ambulatory Visit: Payer: Medicare Other | Admitting: Family Medicine

## 2023-03-03 ENCOUNTER — Encounter: Payer: Self-pay | Admitting: Family Medicine

## 2023-03-03 ENCOUNTER — Telehealth: Payer: Self-pay | Admitting: Family Medicine

## 2023-03-03 VITALS — BP 150/90 | HR 80 | Temp 97.8°F | Resp 18

## 2023-03-03 DIAGNOSIS — R052 Subacute cough: Secondary | ICD-10-CM | POA: Diagnosis not present

## 2023-03-03 DIAGNOSIS — J029 Acute pharyngitis, unspecified: Secondary | ICD-10-CM

## 2023-03-03 DIAGNOSIS — I499 Cardiac arrhythmia, unspecified: Secondary | ICD-10-CM | POA: Diagnosis not present

## 2023-03-03 LAB — POCT COVID BINAXNOW CARD: SARS Coronavirus 2 Ag: NEGATIVE

## 2023-03-03 LAB — POCT RAPID STREP A (OFFICE): Rapid Strep A Screen: NEGATIVE

## 2023-03-03 MED ORDER — DOXYCYCLINE HYCLATE 100 MG PO CAPS: 100.0000 mg | ORAL_CAPSULE | Freq: Two times a day (BID) | ORAL | 0 refills | Status: DC

## 2023-03-03 NOTE — Telephone Encounter (Signed)
Please advise. Pt seen yesterday

## 2023-03-03 NOTE — Patient Instructions (Signed)
It was good to see you today- take doxycycline twice a day for 10 days, take it with food and water!    Please let me know if you are not feeling better over the next few days

## 2023-03-03 NOTE — Telephone Encounter (Signed)
Pt called to check if Dr. Laury Axon has seen her message. I explained that Dr. Laury Axon was not in the office today and with holiday Dr. Laury Axon would not return until Monday so I offered an appt with Dr. Patsy Lager and she accepted.

## 2023-03-03 NOTE — Progress Notes (Signed)
[facility] [address] [place], [place] [address] [phone]  Date:  03/03/2023   Name:  Alexandria [name]   DOB:  03/14/1946   MRN:  [id number]  PCP:  Donato Schultz, DO    Chief Complaint: URI (Sore throat, fever x 1 day- 101.0 taken Asprin)   History of Present Illness:  Alexandria [name] is a 77 y.o. very pleasant female patient who presents with the following:  Pt seen today with concern of illness-I have not seen her myself previously She was seen yesterday by Dr Laury Axon - she was not feeling great, had a cough- the cough is chronic, will come and go.  This is not her acute issue This am she awoke with a fever of 101-she felt cold last night and slept in her robe The cough is not generally productive She also has noted a dry and sore throat- she tries to drink a lot of water She does have chronic diarrhea due to recent colon surgery- no vomiting  She has noted body aches   BP Readings from Last 3 Encounters:  03/03/23 (!) 150/90  03/02/23 (!) 152/80  02/10/23 (!) 140/80     Patient Active Problem List   Diagnosis Date Noted   Suspicious nevus 01/05/2023   Anemia 01/05/2023   Deviated septum 01/05/2023   Episodic tension-type headache, not intractable 11/19/2022   Old myocardial infarction 11/19/2022   Postthrombotic syndrome 11/19/2022   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   Irritable bowel syndrome without diarrhea 11/19/2022   Ileostomy care (HCC) 11/18/2022   Diaper candidiasis 09/28/2022   Ileostomy, has currently (HCC) 09/28/2022   At risk for dehydration 09/16/2022   At high risk for dehydration 09/14/2022   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   Dehydration 08/26/2022   Irritant contact dermatitis associated with digestive stoma 08/26/2022   Ileostomy in place [facility]) 08/26/2022   High output ileostomy (HCC) 08/25/2022   Diastolic dysfunction 08/14/2022   Chronic facial  pain 08/14/2022   Dyspepsia 08/14/2022   Blowout of rectal stump (HCC) 08/14/2022   Diverticulitis of colon 08/13/2022   Colostomy care (HCC) 07/31/2022   Bronchospasm 06/16/2022   Temporal arteritis (HCC) 06/16/2022   Chronic obstructive pulmonary disease with (acute) lower respiratory infection (HCC) 06/16/2022   Angina pectoris with documented spasm (HCC) 06/16/2022   Abdominal aortic aneurysm (AAA) without rupture (HCC) 06/16/2022   Right ankle sprain 06/09/2022   Sprain of right ankle 06/01/2022   Dizziness 06/01/2022   Rectal abscess 06/01/2022   Acute right ankle pain 05/19/2022   History of low anterior resection of rectum 05/04/2022   Irritant contact dermatitis associated with fecal stoma 05/02/2022   Slow transit constipation 05/02/2022   Colostomy complication (HCC) 05/02/2022   IDA (iron deficiency anemia) 04/19/2022   Intra-abdominal abscess (HCC) 04/18/2022   Acute blood loss anemia 04/17/2022   Chronic diastolic CHF (congestive heart failure) (HCC) 04/17/2022   Toxic metabolic encephalopathy 04/17/2022   Ileus following gastrointestinal surgery (HCC) 04/17/2022   Pelvic abscess in female 03/30/2022   Flat foot 11/19/2021   S/P total knee arthroplasty, left 11/13/2021   Ankle impingement syndrome, left 11/13/2021   Rectal bleeding 08/22/2021   Abnormal thyroid blood test 08/22/2021   Abnormal kidney function 08/22/2021   Dumping syndrome 03/13/2020   Arthritis    Bronchial pneumonia    Colon polyp    DVT (deep venous thrombosis) (HCC)    Heart murmur  Migraines    Pancreatitis    Thyroid disease    Aortic root dilatation (HCC) 11/24/2019   Leukocytosis 11/06/2019   Generalized abdominal pain 11/06/2019   Pre-ulcerative corn or callous 09/07/2019   Nausea 09/07/2019   Pain of left calf 05/09/2019   Tibial pain 05/09/2019   Essential hypertension 02/08/2019   Healthcare maintenance 11/10/2018   Bradycardia 07/22/2018   Pansinusitis 06/23/2018    History of transient ischemic attack (TIA) 03/24/2018   IPF (idiopathic pulmonary fibrosis) (HCC) 03/24/2018   Chronic rhinitis 02/16/2018   Eustachian tube dysfunction, bilateral 02/16/2018   URI (upper respiratory infection) 02/16/2018   Seasonal allergic rhinitis due to pollen 02/16/2018   ILD (interstitial lung disease) (HCC) 01/07/2018   Dyspnea 01/07/2018   Acute respiratory failure (HCC) 01/06/2018   TIA (transient ischemic attack) 01/06/2018   Hypokalemia 12/15/2017   Chronic cough 11/26/2017   Basal cell carcinoma (BCC) of neck 06/07/2017   Anisometropia 06/02/2017   Drusen of macula of both eyes 06/02/2017   Photopsia of left eye 06/02/2017   Cardiac murmur 05/07/2017   Situational anxiety 05/07/2017   Headache 05/07/2017   Mitral and aortic insufficiency 05/07/2017   Migraine without status migrainosus, not intractable 06/09/2016   Insomnia 11/18/2015   Normal coronary arteries 05/20/2015   RBBB 05/20/2015   Abnormal CT of the chest 05/20/2015   Hx of myocardial infarction 05/08/2015   Bruising 05/08/2015   Upper airway cough syndrome 02/14/2015   Asthmatic bronchitis with acute exacerbation 02/06/2015   Mild intermittent asthma without complication 02/06/2015   Oliguria 12/28/2014   Hypothyroidism 10/25/2014   Fatigue 10/25/2014   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   Chronic venous insufficiency 10/24/2014   Varicose veins of leg with complications 10/24/2014   Degeneration of intervertebral disc of cervical region 06/29/2014   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   Generalized osteoarthritis of multiple sites 06/29/2014   History of stroke without residual deficits 06/29/2014   Palpitations 11/08/2013   Edema of lower extremity 10/03/2013   Hair loss 11/25/2012   Alopecia 11/25/2012   Diverticulitis of large intestine with perforation and abscess 07/14/2012   Hammer toe 06/13/2012   History of stress test 05/21/2011   Hx of  echocardiogram 01/27/2010   H/O cardiac catheterization 2004    Past Medical History:  Diagnosis Date   Arthritis    Bronchial pneumonia    Chronic facial pain 08/14/2022   Colon polyp    Diverticulitis    DVT (deep venous thrombosis) (HCC)    lower extremity   H/O cardiac catheterization 2004   Normal coronary arteries   Heart murmur    History of stress test 05/21/2011   Hx of echocardiogram 01/27/2010   Normal Ef 55% the transmitral spectral doppler flow pattern is normal for age. the left ventricular wall motion is normal   Hypothyroidism    IPF (idiopathic pulmonary fibrosis) (HCC) 01/2018   Myocardial infarction (HCC)    hx of 30 years ago   Pancreatitis    TIA (transient ischemic attack)    8 years ago    Past Surgical History:  Procedure Laterality Date   BREAST BIOPSY Left    [facility]   CARDIAC CATHETERIZATION     10/2013   CATARACT EXTRACTION Bilateral 03/22/2018   FLEXIBLE SIGMOIDOSCOPY N/A 08/13/2022   Procedure: FLEXIBLE SIGMOIDOSCOPY;  Surgeon: Karie Soda, MD;  Location: [facility];  Service: General;  Laterality: N/A;   ILEOSTOMY CLOSURE N/A 11/19/2022   Procedure: OPEN TAKEDOWN OF LOOP ILEOSTOMY;  Surgeon:  Karie Soda, MD;  Location: [facility];  Service: General;  Laterality: N/A;   LEFT HEART CATHETERIZATION WITH CORONARY ANGIOGRAM N/A 10/25/2013   Procedure: LEFT HEART CATHETERIZATION WITH CORONARY ANGIOGRAM;  Surgeon: Lennette Bihari, MD;  Location[facility] CATH LAB;  Service: Cardiovascular;  Laterality: N/A;   LYSIS OF ADHESION N/A 08/13/2022   Procedure: LYSIS OF ADHESION, DIVERTING ILEOSTOMY;  Surgeon: Karie Soda, MD;  Location: [facility];  Service: General;  Laterality: N/A;   NECK SURGERY     ACDF x 2      done in [place] more than 25 years, doesn't know levels   TOTAL KNEE ARTHROPLASTY     Bilateral x's 2   VAGINAL HYSTERECTOMY  10/17/1998   Konrad Dolores   VIDEO BRONCHOSCOPY Bilateral 01/24/2018   Procedure: VIDEO BRONCHOSCOPY WITH FLUORO;  Surgeon:  Lupita Leash, MD;  Location[facility] ENDOSCOPY;  Service: Cardiopulmonary;  Laterality: Bilateral;   XI ROBOTIC ASSISTED COLOSTOMY TAKEDOWN N/A 08/13/2022   Procedure: ROBOTIC OSTOMY TAKEDOWN, SMALL BOWEL RESECTION X 2, BILATERAL TAP BLOCK, TISSUE PERFUSSION ASSESMENT VIA FIREFLY, MOBILAZATION OF SPLENIC FLEXURE;  Surgeon: Karie Soda, MD;  Location: [facility];  Service: General;  Laterality: N/A;   XI ROBOTIC ASSISTED LOWER ANTERIOR RESECTION N/A 03/31/2022   Procedure: XI ROBOTIC ASSISTED LOWER ANTERIOR RESECTION;  Surgeon: Karie Soda, MD;  Location: [facility];  Service: General;  Laterality: N/A;    Social History   Tobacco Use   Smoking status: Never   Smokeless tobacco: Never  Vaping Use   Vaping status: Never Used  Substance Use Topics   Alcohol use: No    Alcohol/week: 0.0 standard drinks of alcohol   Drug use: No    Family History  Problem Relation Age of Onset   Ovarian cancer Mother    Uterine cancer Mother    Lung cancer Father    HIV Brother        1982   Kidney cancer Brother    Lung cancer Brother    Other Brother        Mouth Cancer   Colon cancer Maternal Aunt    Heart disease Maternal Grandmother    Stroke Maternal Grandmother    Hypertension Maternal Grandmother    Diabetes Maternal Grandmother    Arthritis Other    Esophageal cancer Neg Hx    Liver disease Neg Hx    Rectal cancer Neg Hx    Stomach cancer Neg Hx     Allergies  Allergen Reactions   Prednisone Other (See Comments)    Changed personality    Dilaudid [Hydromorphone] Itching and Other (See Comments)    Dilaudid caused marked confusion   Atrovent Hfa [Ipratropium Bromide Hfa] Itching and Other (See Comments)    Pt could not sleep, Insomnia    Cheratussin Ac [Guaifenesin-Codeine] Other (See Comments)    Headaches    Diltiazem Itching and Other (See Comments)    Makes patient sick. Shakes. GI   Hydrocodone Itching   Influenza Vaccines Other (See Comments)    Pt reports heart attack  after last flu shot   Morphine Itching   Motrin [Ibuprofen] Itching and Other (See Comments)    "Gives false reading in blood"   Oxycodone Itching   Codeine Itching, Nausea Only and Other (See Comments)    Pt takes promethazine with codeine at home    Medication list has been reviewed and updated.  Current Outpatient Medications on File Prior to Visit  Medication Sig Dispense Refill  aspirin EC 81 MG tablet Take 81 mg by mouth 3 (three) times a week. Swallow whole.     benzonatate (TESSALON) 200 MG capsule Take 1 capsule (200 mg total) by mouth 3 (three) times daily as needed for cough. 20 capsule 3   diphenoxylate-atropine (LOMOTIL) 2.5-0.025 MG tablet Take 1 tablet by mouth daily.     ferrous sulfate 325 (65 FE) MG tablet Take 1 tablet (325 mg total) by mouth 2 (two) times daily with a meal. 60 tablet 2   fluticasone (FLONASE) 50 MCG/ACT nasal spray Place 2 sprays into both nostrils daily. 16 g 6   furosemide (LASIX) 20 MG tablet Take 1 tablet (20 mg total) by mouth daily. (Patient taking differently: Take 20 mg by mouth daily as needed for edema or fluid.) 30 tablet 3   levothyroxine (SYNTHROID) 125 MCG tablet Take 1 tablet (125 mcg total) by mouth daily. 90 tablet 3   Multiple Vitamin (MULTIVITAMIN WITH MINERALS) TABS tablet Take 1 tablet by mouth once a week.     OFEV 100 MG CAPS Take 100 mg by mouth 2 (two) times daily.     ondansetron (ZOFRAN-ODT) 4 MG disintegrating tablet TAKE 1 TABLET BY MOUTH EVERY 8 HOURS AS NEEDED FOR NAUSEA AND VOMITING 60 tablet 2   potassium chloride SA (KLOR-CON M) 20 MEQ tablet Take 20 mEq by mouth 3 (three) times a week.     promethazine-dextromethorphan (PROMETHAZINE-DM) 6.25-15 MG/5ML syrup Take 5 mLs by mouth 4 (four) times daily as needed. 118 mL 0   promethazine-dextromethorphan (PROMETHAZINE-DM) 6.25-15 MG/5ML syrup Take 5 mLs by mouth 4 (four) times daily as needed. 118 mL 0   vitamin C (ASCORBIC ACID) 500 MG tablet Take 500 mg by mouth 2 (two)  times a week.     No current facility-administered medications on file prior to visit.    Review of Systems:  As per HPI- otherwise negative.   Physical Examination: Vitals:   03/03/23 1345 03/03/23 1444  BP: (!) 163/90 (!) 150/90  Pulse: 80   Resp: 18   Temp: 97.8 F (36.6 C)   SpO2: 93% 98%   There were no vitals filed for this visit. There is no height or weight on file to calculate BMI. Ideal Body Weight:    GEN: no acute distress.  Normal weight, nontoxic in appearance HEENT: Atraumatic, Normocephalic.  Bilateral TM wnl, oropharynx normal.  PEERL,EOMI.   Ears and Nose: No external deformity. CV: Noted some irregularity concerning for atrial fibrillation, No M/G/R. No JVD. No thrill. No extra heart sounds. PULM: CTA B, no wheezes, crackles, rhonchi. No retractions. No resp. distress. No accessory muscle use.  Occasional wheezy cough ABD: S, NT, ND, +BS. No rebound. No HSM. EXTR: No c/c/e PSYCH: Normally interactive. Conversant.   SR with right BBB, no change compared to previous No atrial fibrillation   Results for orders placed or performed in visit on 03/03/23  POCT rapid strep A  Result Value Ref Range   Rapid Strep A Screen Negative Negative  POCT COVID BINAX NOW CARD  Result Value Ref Range   SARS Coronavirus 2 Ag Negative Negative   *Note: Due to a large number of results and/or encounters for the requested time period, some results have not been displayed. A complete set of results can be found in Results Review.    Assessment and Plan: Sore throat - Plan: POCT rapid strep A, POCT COVID BINAX NOW CARD, CANCELED: POCT Influenza A/B  Subacute cough - Plan: doxycycline (  VIBRAMYCIN) 100 MG capsule  Irregularly irregular pulse rhythm - Plan: EKG 12-Lead  Patient seen today with concern of sore throat and cough.  She feels mildly ill but does not seem to have influenza.  Negative strep and COVID today We will have her start on doxycycline.  We discussed  using prednisone-however patient states she can only tolerate a steroid shot which I would like to avoid in case her fever progresses She will keep [place] posted on how she is feeling Concern of irregular pulse rate but EKG shows sinus rhythm with a stable bundle branch block I offered to do a chest x-ray but she declines today.  Signed Abbe Amsterdam, MD

## 2023-03-03 NOTE — Telephone Encounter (Signed)
Pt called and said she woke up with temp of 101 and sore throat. Please call to advise.

## 2023-03-08 DIAGNOSIS — M9903 Segmental and somatic dysfunction of lumbar region: Secondary | ICD-10-CM | POA: Diagnosis not present

## 2023-03-08 DIAGNOSIS — M5022 Other cervical disc displacement, mid-cervical region, unspecified level: Secondary | ICD-10-CM | POA: Diagnosis not present

## 2023-03-08 DIAGNOSIS — M5124 Other intervertebral disc displacement, thoracic region: Secondary | ICD-10-CM | POA: Diagnosis not present

## 2023-03-08 DIAGNOSIS — M9902 Segmental and somatic dysfunction of thoracic region: Secondary | ICD-10-CM | POA: Diagnosis not present

## 2023-03-08 DIAGNOSIS — M9901 Segmental and somatic dysfunction of cervical region: Secondary | ICD-10-CM | POA: Diagnosis not present

## 2023-03-08 DIAGNOSIS — M5126 Other intervertebral disc displacement, lumbar region: Secondary | ICD-10-CM | POA: Diagnosis not present

## 2023-03-09 DIAGNOSIS — M9902 Segmental and somatic dysfunction of thoracic region: Secondary | ICD-10-CM | POA: Diagnosis not present

## 2023-03-09 DIAGNOSIS — M5124 Other intervertebral disc displacement, thoracic region: Secondary | ICD-10-CM | POA: Diagnosis not present

## 2023-03-09 DIAGNOSIS — M5022 Other cervical disc displacement, mid-cervical region, unspecified level: Secondary | ICD-10-CM | POA: Diagnosis not present

## 2023-03-09 DIAGNOSIS — M5126 Other intervertebral disc displacement, lumbar region: Secondary | ICD-10-CM | POA: Diagnosis not present

## 2023-03-09 DIAGNOSIS — M9901 Segmental and somatic dysfunction of cervical region: Secondary | ICD-10-CM | POA: Diagnosis not present

## 2023-03-09 DIAGNOSIS — M9903 Segmental and somatic dysfunction of lumbar region: Secondary | ICD-10-CM | POA: Diagnosis not present

## 2023-03-09 NOTE — Telephone Encounter (Signed)
Pt saw Dr. Patsy Lager.

## 2023-03-15 DIAGNOSIS — M9901 Segmental and somatic dysfunction of cervical region: Secondary | ICD-10-CM | POA: Diagnosis not present

## 2023-03-15 DIAGNOSIS — M5126 Other intervertebral disc displacement, lumbar region: Secondary | ICD-10-CM | POA: Diagnosis not present

## 2023-03-15 DIAGNOSIS — M5124 Other intervertebral disc displacement, thoracic region: Secondary | ICD-10-CM | POA: Diagnosis not present

## 2023-03-15 DIAGNOSIS — M9903 Segmental and somatic dysfunction of lumbar region: Secondary | ICD-10-CM | POA: Diagnosis not present

## 2023-03-15 DIAGNOSIS — M9902 Segmental and somatic dysfunction of thoracic region: Secondary | ICD-10-CM | POA: Diagnosis not present

## 2023-03-15 DIAGNOSIS — M5022 Other cervical disc displacement, mid-cervical region, unspecified level: Secondary | ICD-10-CM | POA: Diagnosis not present

## 2023-03-17 DIAGNOSIS — M5126 Other intervertebral disc displacement, lumbar region: Secondary | ICD-10-CM | POA: Diagnosis not present

## 2023-03-17 DIAGNOSIS — G319 Degenerative disease of nervous system, unspecified: Secondary | ICD-10-CM | POA: Diagnosis not present

## 2023-03-17 DIAGNOSIS — M9901 Segmental and somatic dysfunction of cervical region: Secondary | ICD-10-CM | POA: Diagnosis not present

## 2023-03-17 DIAGNOSIS — M9902 Segmental and somatic dysfunction of thoracic region: Secondary | ICD-10-CM | POA: Diagnosis not present

## 2023-03-17 DIAGNOSIS — M9903 Segmental and somatic dysfunction of lumbar region: Secondary | ICD-10-CM | POA: Diagnosis not present

## 2023-03-17 DIAGNOSIS — J3489 Other specified disorders of nose and nasal sinuses: Secondary | ICD-10-CM | POA: Diagnosis not present

## 2023-03-17 DIAGNOSIS — J329 Chronic sinusitis, unspecified: Secondary | ICD-10-CM | POA: Diagnosis not present

## 2023-03-17 DIAGNOSIS — M5124 Other intervertebral disc displacement, thoracic region: Secondary | ICD-10-CM | POA: Diagnosis not present

## 2023-03-17 DIAGNOSIS — R9082 White matter disease, unspecified: Secondary | ICD-10-CM | POA: Diagnosis not present

## 2023-03-17 DIAGNOSIS — M5022 Other cervical disc displacement, mid-cervical region, unspecified level: Secondary | ICD-10-CM | POA: Diagnosis not present

## 2023-03-19 ENCOUNTER — Ambulatory Visit (INDEPENDENT_AMBULATORY_CARE_PROVIDER_SITE_OTHER): Payer: Medicare Other | Admitting: Family Medicine

## 2023-03-19 ENCOUNTER — Encounter: Payer: Self-pay | Admitting: Family Medicine

## 2023-03-19 VITALS — BP 140/80 | HR 90 | Temp 97.8°F | Resp 20 | Ht 64.0 in | Wt 133.8 lb

## 2023-03-19 DIAGNOSIS — J0140 Acute pansinusitis, unspecified: Secondary | ICD-10-CM | POA: Diagnosis not present

## 2023-03-19 MED ORDER — DOXYCYCLINE HYCLATE 100 MG PO TABS: 100.0000 mg | ORAL_TABLET | Freq: Two times a day (BID) | ORAL | 0 refills | Status: DC

## 2023-03-19 NOTE — Assessment & Plan Note (Signed)
Doxy x 10 days  Flonase  Pt to have sinus surgery in Feb

## 2023-03-19 NOTE — Patient Instructions (Signed)
Sinus Infection, Adult A sinus infection, also called sinusitis, is inflammation of your sinuses. Sinuses are hollow spaces in the bones around your face. Your sinuses are located: Around your eyes. In the middle of your forehead. Behind your nose. In your cheekbones. Mucus normally drains out of your sinuses. When your nasal tissues become inflamed or swollen, mucus can become trapped or blocked. This allows bacteria, viruses, and fungi to grow, which leads to infection. Most infections of the sinuses are caused by a virus. A sinus infection can develop quickly. It can last for up to 4 weeks (acute) or for more than 12 weeks (chronic). A sinus infection often develops after a cold. What are the causes? This condition is caused by anything that creates swelling in the sinuses or stops mucus from draining. This includes: Allergies. Asthma. Infection from bacteria or viruses. Deformities or blockages in your nose or sinuses. Abnormal growths in the nose (nasal polyps). Pollutants, such as chemicals or irritants in the air. Infection from fungi. This is rare. What increases the risk? You are more likely to develop this condition if you: Have a weak body defense system (immune system). Do a lot of swimming or diving. Overuse nasal sprays. Smoke. What are the signs or symptoms? The main symptoms of this condition are pain and a feeling of pressure around the affected sinuses. Other symptoms include: Stuffy nose or congestion that makes it difficult to breathe through your nose. Thick yellow or greenish drainage from your nose. Tenderness, swelling, and warmth over the affected sinuses. A cough that may get worse at night. Decreased sense of smell and taste. Extra mucus that collects in the throat or the back of the nose (postnasal drip) causing a sore throat or bad breath. Tiredness (fatigue). Fever. How is this diagnosed? This condition is diagnosed based on: Your symptoms. Your  medical history. A physical exam. Tests to find out if your condition is acute or chronic. This may include: Checking your nose for nasal polyps. Viewing your sinuses using a device that has a light (endoscope). Testing for allergies or bacteria. Imaging tests, such as an MRI or CT scan. In rare cases, a bone biopsy may be done to rule out more serious types of fungal sinus disease. How is this treated? Treatment for a sinus infection depends on the cause and whether your condition is chronic or acute. If caused by a virus, your symptoms should go away on their own within 10 days. You may be given medicines to relieve symptoms. They include: Medicines that shrink swollen nasal passages (decongestants). A spray that eases inflammation of the nostrils (topical intranasal corticosteroids). Rinses that help get rid of thick mucus in your nose (nasal saline washes). Medicines that treat allergies (antihistamines). Over-the-counter pain relievers. If caused by bacteria, your health care provider may recommend waiting to see if your symptoms improve. Most bacterial infections will get better without antibiotic medicine. You may be given antibiotics if you have: A severe infection. A weak immune system. If caused by narrow nasal passages or nasal polyps, surgery may be needed. Follow these instructions at home: Medicines Take, use, or apply over-the-counter and prescription medicines only as told by your health care provider. These may include nasal sprays. If you were prescribed an antibiotic medicine, take it as told by your health care provider. Do not stop taking the antibiotic even if you start to feel better. Hydrate and humidify  Drink enough fluid to keep your urine pale yellow. Staying hydrated will help   to thin your mucus. Use a cool mist humidifier to keep the humidity level in your home above 50%. Inhale steam for 10-15 minutes, 3-4 times a day, or as told by your health care  provider. You can do this in the bathroom while a hot shower is running. Limit your exposure to cool or dry air. Rest Rest as much as possible. Sleep with your head raised (elevated). Make sure you get enough sleep each night. General instructions  Apply a warm, moist washcloth to your face 3-4 times a day or as told by your health care provider. This will help with discomfort. Use nasal saline washes as often as told by your health care provider. Wash your hands often with soap and water to reduce your exposure to germs. If soap and water are not available, use hand sanitizer. Do not smoke. Avoid being around people who are smoking (secondhand smoke). Keep all follow-up visits. This is important. Contact a health care provider if: You have a fever. Your symptoms get worse. Your symptoms do not improve within 10 days. Get help right away if: You have a severe headache. You have persistent vomiting. You have severe pain or swelling around your face or eyes. You have vision problems. You develop confusion. Your neck is stiff. You have trouble breathing. These symptoms may be an emergency. Get help right away. Call 911. Do not wait to see if the symptoms will go away. Do not drive yourself to the hospital. Summary A sinus infection is soreness and inflammation of your sinuses. Sinuses are hollow spaces in the bones around your face. This condition is caused by nasal tissues that become inflamed or swollen. The swelling traps or blocks the flow of mucus. This allows bacteria, viruses, and fungi to grow, which leads to infection. If you were prescribed an antibiotic medicine, take it as told by your health care provider. Do not stop taking the antibiotic even if you start to feel better. Keep all follow-up visits. This is important. This information is not intended to replace advice given to you by your health care provider. Make sure you discuss any questions you have with your health  care provider. Document Revised: 02/25/2021 Document Reviewed: 02/25/2021 Elsevier Patient Education  2024 [employer]  

## 2023-03-19 NOTE — Progress Notes (Signed)
Established Patient Office Visit  Subjective   Patient ID: Alexandria [name], female    DOB: [birth year]  Age: 77 y.o. MRN: [id number]  Chief Complaint  Patient presents with   Cough    HPI Discussed the use of AI scribe software for clinical note transcription with the patient, who gave verbal consent to proceed.  History of Present Illness   The patient presents with a chronic history of sinusitis, which has been causing significant discomfort and impacting her quality of life. She reports frequent sinus headaches, which are severe enough to affect her daily routine. The patient also mentions a persistent cough, which she believes is due to sinus drainage. She has been on multiple courses of antibiotics, including doxycycline, which provide temporary relief but the symptoms recur soon after the course is completed.  The patient has been considering chiropractic treatment for her sinus issues, as she is growing weary of the constant need for antibiotics. She has also discussed the possibility of surgery with an ENT specialist, which is scheduled for February. The patient has had a CT scan at [facility] to further investigate the sinus issues.  In addition to the sinusitis, the patient also reports back pain, which she tolerates. She has been managing her symptoms with chiropractic care.  The patient is due to travel to [place] and has concerns about the journey due to the current state of her health. She has been managing her symptoms with over-the-counter medications and home remedies, such as vitamin C. She has also been recommended a device called Navaj for her sinus issues, but is hesitant due to the cost and uncertainty about its effectiveness.  The patient's symptoms have been persistent and are causing significant distress, affecting her daily activities and overall quality of life. She is hopeful that the upcoming surgery will provide some relief.      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   Dehydration 08/26/2022   Irritant contact dermatitis associated with digestive stoma 08/26/2022   Ileostomy in place [facility]) 08/26/2022   High output ileostomy (HCC) 08/25/2022   Diastolic dysfunction 08/14/2022   Chronic facial pain 08/14/2022   Dyspepsia 08/14/2022   Blowout of rectal stump (HCC) 08/14/2022   Diverticulitis of colon 08/13/2022   Colostomy care (HCC) 07/31/2022   Bronchospasm 06/16/2022   Temporal arteritis (HCC) 06/16/2022   Chronic obstructive pulmonary disease with (acute) lower respiratory infection (HCC) 06/16/2022   Angina pectoris with documented spasm (HCC) 06/16/2022   Abdominal aortic aneurysm (AAA) without rupture (HCC) 06/16/2022   Right ankle sprain 06/09/2022   Sprain of right ankle 06/01/2022   Dizziness 06/01/2022   Rectal abscess 06/01/2022   Acute right ankle pain 05/19/2022   History of low anterior resection of rectum 05/04/2022   Irritant contact dermatitis associated with fecal stoma 05/02/2022   Slow transit constipation 05/02/2022   Colostomy complication (HCC) 05/02/2022   IDA (iron deficiency anemia) 04/19/2022   Intra-abdominal abscess (HCC) 04/18/2022   Acute blood  loss anemia 04/17/2022   Chronic diastolic CHF (congestive heart failure) (HCC) 04/17/2022   Toxic metabolic encephalopathy 04/17/2022   Ileus following gastrointestinal surgery (HCC) 04/17/2022   Pelvic  abscess in female 03/30/2022   Flat foot 11/19/2021   S/P total knee arthroplasty, left 11/13/2021   Ankle impingement syndrome, left 11/13/2021   Rectal bleeding 08/22/2021   Abnormal thyroid blood test 08/22/2021   Abnormal kidney function 08/22/2021   Dumping syndrome 03/13/2020   Arthritis    Bronchial pneumonia    Colon polyp    DVT (deep venous thrombosis) (HCC)    Heart murmur    Migraines    Pancreatitis    Thyroid disease    Aortic root dilatation (HCC) 11/24/2019   Leukocytosis 11/06/2019   Generalized abdominal pain 11/06/2019   Pre-ulcerative corn or callous 09/07/2019   Nausea 09/07/2019   Pain of left calf 05/09/2019   Tibial pain 05/09/2019   Essential hypertension 02/08/2019   Healthcare maintenance 11/10/2018   Bradycardia 07/22/2018   Pansinusitis 06/23/2018   History of transient ischemic attack (TIA) 03/24/2018   IPF (idiopathic pulmonary fibrosis) (HCC) 03/24/2018   Chronic rhinitis 02/16/2018   Eustachian tube dysfunction, bilateral 02/16/2018   URI (upper respiratory infection) 02/16/2018   Seasonal allergic rhinitis due to pollen 02/16/2018   ILD (interstitial lung disease) (HCC) 01/07/2018   Dyspnea 01/07/2018   Acute respiratory failure (HCC) 01/06/2018   TIA (transient ischemic attack) 01/06/2018   Hypokalemia 12/15/2017   Chronic cough 11/26/2017   Basal cell carcinoma (BCC) of neck 06/07/2017   Anisometropia 06/02/2017   Drusen of macula of both eyes 06/02/2017   Photopsia of left eye 06/02/2017   Cardiac murmur 05/07/2017   Situational anxiety 05/07/2017   Headache 05/07/2017   Mitral and aortic insufficiency 05/07/2017   Migraine without status migrainosus, not intractable 06/09/2016   Insomnia 11/18/2015   Normal coronary arteries 05/20/2015   RBBB 05/20/2015   Abnormal CT of the chest 05/20/2015   Hx of myocardial infarction 05/08/2015   Bruising 05/08/2015   Upper airway cough syndrome 02/14/2015   Asthmatic bronchitis with  acute exacerbation 02/06/2015   Mild intermittent asthma without complication 02/06/2015   Oliguria 12/28/2014   Hypothyroidism 10/25/2014   Fatigue 10/25/2014   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   Chronic venous insufficiency 10/24/2014   Varicose veins of leg with complications 10/24/2014   Degeneration of intervertebral disc of cervical region 06/29/2014   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   Generalized osteoarthritis of multiple sites 06/29/2014   History of stroke without residual deficits 06/29/2014   Palpitations 11/08/2013   Edema of lower extremity 10/03/2013   Hair loss 11/25/2012   Alopecia 11/25/2012   Diverticulitis of large intestine with perforation and abscess 07/14/2012   Hammer toe 06/13/2012   History of stress test 05/21/2011   Hx of echocardiogram 01/27/2010   H/O cardiac catheterization 2004   Past Medical History:  Diagnosis Date   Arthritis    Bronchial pneumonia    Chronic facial pain 08/14/2022   Colon polyp    Diverticulitis    DVT (deep venous thrombosis) (HCC)    lower extremity   H/O cardiac catheterization 2004   Normal coronary arteries   Heart murmur    History of stress test 05/21/2011   Hx of echocardiogram 01/27/2010   Normal Ef 55% the transmitral spectral doppler flow pattern is normal for age. the left ventricular wall motion is normal   Hypothyroidism    IPF (idiopathic pulmonary fibrosis) (HCC) 01/2018  Myocardial infarction (HCC)    hx of 30 years ago   Pancreatitis    TIA (transient ischemic attack)    8 years ago   Past Surgical History:  Procedure Laterality Date   BREAST BIOPSY Left    [facility]   CARDIAC CATHETERIZATION     10/2013   CATARACT EXTRACTION Bilateral 03/22/2018   FLEXIBLE SIGMOIDOSCOPY N/A 08/13/2022   Procedure: FLEXIBLE SIGMOIDOSCOPY;  Surgeon: Karie Soda, MD;  Location: [facility];  Service: General;  Laterality: N/A;   ILEOSTOMY CLOSURE N/A 11/19/2022   Procedure: OPEN TAKEDOWN  OF LOOP ILEOSTOMY;  Surgeon: Karie Soda, MD;  Location: [facility];  Service: General;  Laterality: N/A;   LEFT HEART CATHETERIZATION WITH CORONARY ANGIOGRAM N/A 10/25/2013   Procedure: LEFT HEART CATHETERIZATION WITH CORONARY ANGIOGRAM;  Surgeon: Lennette Bihari, MD;  Location: [facility] CATH LAB;  Service: Cardiovascular;  Laterality: N/A;   LYSIS OF ADHESION N/A 08/13/2022   Procedure: LYSIS OF ADHESION, DIVERTING ILEOSTOMY;  Surgeon: Karie Soda, MD;  Location: [facility];  Service: General;  Laterality: N/A;   NECK SURGERY     ACDF x 2      done in [place] more than 25 years, doesn't know levels   TOTAL KNEE ARTHROPLASTY     Bilateral x's 2   VAGINAL HYSTERECTOMY  10/17/1998   Konrad Dolores   VIDEO BRONCHOSCOPY Bilateral 01/24/2018   Procedure: VIDEO BRONCHOSCOPY WITH FLUORO;  Surgeon: Lupita Leash, MD;  Location[facility] ENDOSCOPY;  Service: Cardiopulmonary;  Laterality: Bilateral;   XI ROBOTIC ASSISTED COLOSTOMY TAKEDOWN N/A 08/13/2022   Procedure: ROBOTIC OSTOMY TAKEDOWN, SMALL BOWEL RESECTION X 2, BILATERAL TAP BLOCK, TISSUE PERFUSSION ASSESMENT VIA FIREFLY, MOBILAZATION OF SPLENIC FLEXURE;  Surgeon: Karie Soda, MD;  Location: [facility];  Service: General;  Laterality: N/A;   XI ROBOTIC ASSISTED LOWER ANTERIOR RESECTION N/A 03/31/2022   Procedure: XI ROBOTIC ASSISTED LOWER ANTERIOR RESECTION;  Surgeon: Karie Soda, MD;  Location: [facility];  Service: General;  Laterality: N/A;   Social History   Tobacco Use   Smoking status: Never   Smokeless tobacco: Never  Vaping Use   Vaping status: Never Used  Substance Use Topics   Alcohol use: No    Alcohol/week: 0.0 standard drinks of alcohol   Drug use: No   Social History   Socioeconomic History   Marital status: Married    Spouse name: Tasia Catchings   Number of children: 3   Years of education: Jr college   Highest education level: Not on file  Occupational History   Occupation: Leisure centre manager: UNEMPLOYED   Occupation: retired  Tobacco Use    Smoking status: Never   Smokeless tobacco: Never  Vaping Use   Vaping status: Never Used  Substance and Sexual Activity   Alcohol use: No    Alcohol/week: 0.0 standard drinks of alcohol   Drug use: No   Sexual activity: Not Currently  Other Topics Concern   Not on file  Social History Narrative   Lives with husband Tasia Catchings   Caffeine use: coffee (2 cups per day)   Mostly right-handed   Social Drivers of Health   Financial Resource Strain: Not on file  Food Insecurity: Patient Declined (11/20/2022)   Hunger Vital Sign    Worried About Running Out of Food in the Last Year: Patient declined    Ran Out of Food in the Last Year: Patient declined  Transportation Needs: No Transportation Needs (11/24/2022)   PRAPARE - Transportation  Lack of Transportation (Medical): No    Lack of Transportation (Non-Medical): No  Physical Activity: Not on file  Stress: Not on file  Social Connections: Not on file  Intimate Partner Violence: Patient Declined (11/20/2022)   Humiliation, Afraid, Rape, and Kick questionnaire    Fear of Current or Ex-Partner: Patient declined    Emotionally Abused: Patient declined    Physically Abused: Patient declined    Sexually Abused: Patient declined   Family Status  Relation Name Status   Mother  Deceased   Father  Deceased   Brother  (Not Specified)   Brother  (Not Specified)   Brother  (Not Specified)   Brother  (Not Specified)   Brother  (Not Specified)   Mat Aunt  (Not Specified)   MGM  (Not Specified)   Other  (Not Specified)   Neg Hx  (Not Specified)  No partnership data on file   Family History  Problem Relation Age of Onset   Ovarian cancer Mother    Uterine cancer Mother    Lung cancer Father    HIV Brother        1982   Kidney cancer Brother    Lung cancer Brother    Other Brother        Mouth Cancer   Colon cancer Maternal Aunt    Heart disease Maternal Grandmother    Stroke Maternal Grandmother    Hypertension Maternal  Grandmother    Diabetes Maternal Grandmother    Arthritis Other    Esophageal cancer Neg Hx    Liver disease Neg Hx    Rectal cancer Neg Hx    Stomach cancer Neg Hx    Allergies  Allergen Reactions   Prednisone Other (See Comments)    Changed personality    Dilaudid [Hydromorphone] Itching and Other (See Comments)    Dilaudid caused marked confusion   Atrovent Hfa [Ipratropium Bromide Hfa] Itching and Other (See Comments)    Pt could not sleep, Insomnia    Cheratussin Ac [Guaifenesin-Codeine] Other (See Comments)    Headaches    Diltiazem Itching and Other (See Comments)    Makes patient sick. Shakes. GI   Hydrocodone Itching   Influenza Vaccines Other (See Comments)    Pt reports heart attack after last flu shot   Morphine Itching   Motrin [Ibuprofen] Itching and Other (See Comments)    "Gives false reading in blood"   Oxycodone Itching   Codeine Itching, Nausea Only and Other (See Comments)    Pt takes promethazine with codeine at home      Review of Systems  Constitutional:  Negative for chills, fever and malaise/fatigue.  HENT:  Positive for congestion and sinus pain. Negative for hearing loss and sore throat.   Eyes:  Negative for blurred vision and discharge.  Respiratory:  Negative for cough, sputum production and shortness of breath.   Cardiovascular:  Negative for chest pain, palpitations and leg swelling.  Gastrointestinal:  Negative for abdominal pain, blood in stool, constipation, diarrhea, heartburn, nausea and vomiting.  Genitourinary:  Negative for dysuria, frequency, hematuria and urgency.  Musculoskeletal:  Negative for back pain, falls and myalgias.  Skin:  Negative for rash.  Neurological:  Negative for dizziness, sensory change, loss of consciousness, weakness and headaches.  Endo/Heme/Allergies:  Negative for environmental allergies. Does not bruise/bleed easily.  Psychiatric/Behavioral:  Negative for depression and suicidal ideas. The patient is not  nervous/anxious and does not have insomnia.       Objective:  BP (!) 140/80 (BP Location: Left Arm, Patient Position: Sitting, Cuff Size: Normal)   Pulse 90   Temp 97.8 F (36.6 C) (Oral)   Resp 20   Ht 5\' 4"  (1.626 m)   Wt 133 lb 12.8 oz (60.7 kg)   SpO2 97%   BMI 22.97 kg/m  BP Readings from Last 3 Encounters:  03/19/23 (!) 140/80  03/03/23 (!) 150/90  03/02/23 (!) 152/80   Wt Readings from Last 3 Encounters:  03/19/23 133 lb 12.8 oz (60.7 kg)  03/02/23 131 lb 12.8 oz (59.8 kg)  02/10/23 135 lb (61.2 kg)   SpO2 Readings from Last 3 Encounters:  03/19/23 97%  03/03/23 98%  03/02/23 97%      Physical Exam Vitals and nursing note reviewed.  Constitutional:      General: She is not in acute distress.    Appearance: Normal appearance. She is well-developed.  HENT:     Head: Normocephalic and atraumatic.     Nose:     Left Sinus: Maxillary sinus tenderness and frontal sinus tenderness present.  Eyes:     General: No scleral icterus.       Right eye: No discharge.        Left eye: No discharge.  Cardiovascular:     Rate and Rhythm: Normal rate and regular rhythm.     Heart sounds: No murmur heard. Pulmonary:     Effort: Pulmonary effort is normal. No respiratory distress.     Breath sounds: Normal breath sounds.  Musculoskeletal:        General: Normal range of motion.     Cervical back: Normal range of motion and neck supple.     Right lower leg: No edema.     Left lower leg: No edema.  Skin:    General: Skin is warm and dry.  Neurological:     Mental Status: She is alert and oriented to person, place, and time.  Psychiatric:        Mood and Affect: Mood normal.        Behavior: Behavior normal.        Thought Content: Thought content normal.        Judgment: Judgment normal.      No results found for any visits on 03/19/23.  Last CBC Lab Results  Component Value Date   WBC 5.9 01/06/2023   HGB 12.4 01/06/2023   HCT 37.9 01/06/2023   MCV  97.8 01/06/2023   MCH 31.3 12/04/2022   RDW 14.9 01/06/2023   PLT 246.0 01/06/2023   Last metabolic panel Lab Results  Component Value Date   GLUCOSE 92 02/05/2023   NA 140 02/05/2023   K 3.9 02/05/2023   CL 102 02/05/2023   CO2 29 02/05/2023   BUN 11 02/05/2023   CREATININE 0.88 02/05/2023   GFR 64.03 11/26/2022   CALCIUM 9.8 02/05/2023   PHOS 2.4 (L) 04/21/2022   PROT 7.5 02/05/2023   ALBUMIN 3.7 11/26/2022   BILITOT 0.6 02/05/2023   ALKPHOS 58 11/26/2022   AST 17 02/05/2023   ALT 9 02/05/2023   ANIONGAP 11 11/20/2022   Last lipids Lab Results  Component Value Date   CHOL 177 12/04/2022   HDL 44 (L) 12/04/2022   LDLCALC 113 (H) 12/04/2022   TRIG 95 12/04/2022   CHOLHDL 4.0 12/04/2022   Last hemoglobin A1c Lab Results  Component Value Date   HGBA1C 5.4 04/05/2022   Last thyroid functions Lab Results  Component Value Date  TSH 54.64 (H) 12/04/2022   T4TOTAL 11.0 08/22/2021   Last vitamin D Lab Results  Component Value Date   VD25OH 29.9 (L) 05/09/2019   Last vitamin B12 and Folate Lab Results  Component Value Date   VITAMINB12 640 04/18/2022   FOLATE 10.8 04/18/2022      The ASCVD Risk score (Arnett DK, et al., 2019) failed to calculate for the following reasons:   Risk score cannot be calculated because patient has a medical history suggesting prior/existing ASCVD    Assessment & Plan:   Problem List Items Addressed This Visit       Unprioritized   Pansinusitis - Primary   Doxy x 10 days  Flonase  Pt to have sinus surgery in Feb      Relevant Medications   doxycycline (VIBRA-TABS) 100 MG tablet  Assessment and Plan    Chronic Sinusitis   She has chronic sinusitis with recurrent infections and drainage, experiencing frequent sinus infections that necessitate antibiotics, alongside headaches and cough likely due to post-nasal drip. An ENT evaluation uncovered significant anatomical issues, warranting surgical intervention, confirmed by a  CT scan to assess sinus anatomy. We discussed the risks and benefits of surgery, which could improve sinus drainage and reduce infections. She considered alternative treatments like chiropractic care and Navaj for sinus drainage but opted for surgery due to persistent symptoms. We will schedule sinus surgery in February, continue doxycycline, consider using Navaj for sinus drainage, and follow up with ENT post-surgery.  Chronic Back Pain   Her back pain is persistent but tolerable. We discussed the potential benefits of chiropractic care for additional relief. She will continue current pain management strategies and consider chiropractic care.  General Health Maintenance   There are no specific issues under general health maintenance. She will continue taking vitamin C and maintain regular health check-ups.  Follow-up   She must ensure to take doxycycline as prescribed, monitor symptoms, and seek medical attention if her condition worsens. A follow-up with her primary care physician is advised after returning from travel.        Return if symptoms worsen or fail to improve.    Donato Schultz, DO

## 2023-03-24 DIAGNOSIS — M9903 Segmental and somatic dysfunction of lumbar region: Secondary | ICD-10-CM | POA: Diagnosis not present

## 2023-03-24 DIAGNOSIS — M5126 Other intervertebral disc displacement, lumbar region: Secondary | ICD-10-CM | POA: Diagnosis not present

## 2023-03-24 DIAGNOSIS — M5022 Other cervical disc displacement, mid-cervical region, unspecified level: Secondary | ICD-10-CM | POA: Diagnosis not present

## 2023-03-24 DIAGNOSIS — M9901 Segmental and somatic dysfunction of cervical region: Secondary | ICD-10-CM | POA: Diagnosis not present

## 2023-03-24 DIAGNOSIS — M9902 Segmental and somatic dysfunction of thoracic region: Secondary | ICD-10-CM | POA: Diagnosis not present

## 2023-03-24 DIAGNOSIS — M5124 Other intervertebral disc displacement, thoracic region: Secondary | ICD-10-CM | POA: Diagnosis not present

## 2023-03-26 ENCOUNTER — Other Ambulatory Visit: Payer: Self-pay | Admitting: Family Medicine

## 2023-03-26 DIAGNOSIS — R051 Acute cough: Secondary | ICD-10-CM

## 2023-03-26 MED ORDER — PROMETHAZINE-DM 6.25-15 MG/5ML PO SYRP: 5.0000 mL | ORAL_SOLUTION | Freq: Four times a day (QID) | ORAL | 0 refills | Status: DC | PRN

## 2023-03-26 NOTE — Telephone Encounter (Signed)
Copied from CRM [id number]. Topic: Clinical - Medication Refill >> Mar 26, 2023 12:27 PM Elita Quick wrote: Most Recent Primary Care Visit:  Provider: Seabron Spates R  Department: [facility]  Visit Type: OFFICE VISIT  Date: 03/19/2023  Medication: ***  Has the patient contacted their pharmacy?  (Agent: If no, request that the patient contact the pharmacy for the refill. If patient does not wish to contact the pharmacy document the reason why and proceed with request.) (Agent: If yes, when and what did the pharmacy advise?)  Is this the correct pharmacy for this prescription?  If no, delete pharmacy and type the correct one.  This is the patient's preferred pharmacy:  [id number] - [place],  - [address] [place] [address] Phone: [phone] Fax: [phone]  [facility] [place] - [address] [place] [address] Phone: [phone] Fax: [phone]   Has the prescription been filled recently?   Is the patient out of the medication?   Has the patient been seen for an appointment in the last year OR does the patient have an upcoming appointment?   Can we respond through MyChart?   Agent: Please be advised that Rx refills may take up to 3 business days. We ask that you follow-up with your pharmacy.

## 2023-04-12 DIAGNOSIS — M9903 Segmental and somatic dysfunction of lumbar region: Secondary | ICD-10-CM | POA: Diagnosis not present

## 2023-04-12 DIAGNOSIS — M9902 Segmental and somatic dysfunction of thoracic region: Secondary | ICD-10-CM | POA: Diagnosis not present

## 2023-04-12 DIAGNOSIS — M9901 Segmental and somatic dysfunction of cervical region: Secondary | ICD-10-CM | POA: Diagnosis not present

## 2023-04-12 DIAGNOSIS — M5124 Other intervertebral disc displacement, thoracic region: Secondary | ICD-10-CM | POA: Diagnosis not present

## 2023-04-12 DIAGNOSIS — M5126 Other intervertebral disc displacement, lumbar region: Secondary | ICD-10-CM | POA: Diagnosis not present

## 2023-04-12 DIAGNOSIS — M5022 Other cervical disc displacement, mid-cervical region, unspecified level: Secondary | ICD-10-CM | POA: Diagnosis not present

## 2023-04-14 DIAGNOSIS — M9903 Segmental and somatic dysfunction of lumbar region: Secondary | ICD-10-CM | POA: Diagnosis not present

## 2023-04-14 DIAGNOSIS — M9901 Segmental and somatic dysfunction of cervical region: Secondary | ICD-10-CM | POA: Diagnosis not present

## 2023-04-14 DIAGNOSIS — M5022 Other cervical disc displacement, mid-cervical region, unspecified level: Secondary | ICD-10-CM | POA: Diagnosis not present

## 2023-04-14 DIAGNOSIS — M9902 Segmental and somatic dysfunction of thoracic region: Secondary | ICD-10-CM | POA: Diagnosis not present

## 2023-04-14 DIAGNOSIS — M5126 Other intervertebral disc displacement, lumbar region: Secondary | ICD-10-CM | POA: Diagnosis not present

## 2023-04-14 DIAGNOSIS — M5124 Other intervertebral disc displacement, thoracic region: Secondary | ICD-10-CM | POA: Diagnosis not present

## 2023-04-16 ENCOUNTER — Encounter: Payer: Self-pay | Admitting: Family Medicine

## 2023-04-16 ENCOUNTER — Telehealth (INDEPENDENT_AMBULATORY_CARE_PROVIDER_SITE_OTHER): Payer: Medicare Other | Admitting: Family Medicine

## 2023-04-16 DIAGNOSIS — J0141 Acute recurrent pansinusitis: Secondary | ICD-10-CM | POA: Diagnosis not present

## 2023-04-16 MED ORDER — AMOXICILLIN-POT CLAVULANATE 875-125 MG PO TABS: 1.0000 | ORAL_TABLET | Freq: Two times a day (BID) | ORAL | 0 refills | Status: DC

## 2023-04-16 NOTE — Patient Instructions (Signed)

## 2023-04-16 NOTE — Progress Notes (Signed)
 MyChart Video Visit    Virtual Visit via Video Note   This patient is at least at moderate risk for complications without adequate follow up. This format is felt to be most appropriate for this patient at this time. Physical exam was limited by quality of the video and audio technology used for the visit. [name] was able to get the patient set up on a video visit.  Patient location: Home Patient and provider in visit Provider location: Office  I discussed the limitations of evaluation and management by telemedicine and the availability of in person appointments. The patient expressed understanding and agreed to proceed.  Visit Date: 04/16/2023  Today's healthcare provider: [name] Antonio Cyndee, DO     Subjective:    Patient ID: Alexandria [name], female    DOB: 09-04-1945, 78 y.o.   MRN: [id number]  No chief complaint on file.   HPI Patient is in today for shortness of breath and congestion Discussed the use of AI scribe software for clinical note transcription with the patient, who gave verbal consent to proceed.  History of Present Illness   The patient presented with acute onset of nausea and congestion, starting earlier in the day. She reported feeling as if she were choking due to the congestion, which she believes is causing the nausea. The patient also described feeling generally unwell, with intermittent fever reaching up to 102 degrees. Despite the fever, she denied experiencing any flu-like aches. She reported significant fatigue and shortness of breath, which she attributed to the congestion. The congestion was described as being both in the chest and head.  The patient had tried using Vicks on her chest and Flonase  for symptom relief, but found these measures ineffective. She had also purchased a device called Nuvessa , but had not yet tried it. She reported a previous positive experience with Augmentin  for similar symptoms. She had been prescribed a different  antibiotic by her IPF doctor, but found it ineffective. She was scheduled to see her IPF doctor in the near future.  The patient also mentioned having some cough medicine at home, but reported that it made her feel nauseous. She had medication for nausea at home, referred to as happy pills, which were identified as Aldosterone.       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 the left ventricular wall motion is normal   Hypothyroidism    IPF (idiopathic pulmonary fibrosis) (HCC) 01/2018   Myocardial infarction (HCC)    hx of 30 years ago   Pancreatitis    TIA (transient ischemic attack)    8 years ago    Past Surgical History:  Procedure Laterality Date   BREAST BIOPSY Left    [facility]   CARDIAC CATHETERIZATION     10/2013   CATARACT EXTRACTION Bilateral 03/22/2018   FLEXIBLE SIGMOIDOSCOPY N/A 08/13/2022   Procedure: FLEXIBLE SIGMOIDOSCOPY;  Surgeon: Sheldon Standing, MD;  Location: [facility];  Service: General;  Laterality: N/A;   ILEOSTOMY CLOSURE N/A 11/19/2022   Procedure: OPEN TAKEDOWN OF LOOP  ILEOSTOMY;  Surgeon: Sheldon Standing, MD;  Location: [facility];  Service: General;  Laterality: N/A;   LEFT HEART CATHETERIZATION WITH CORONARY ANGIOGRAM N/A 10/25/2013   Procedure: LEFT HEART CATHETERIZATION WITH CORONARY ANGIOGRAM;  Surgeon: Debby DELENA Sor, MD;  Location[facility] CATH LAB;  Service: Cardiovascular;  Laterality: N/A;   LYSIS OF ADHESION N/A 08/13/2022   Procedure: LYSIS OF ADHESION, DIVERTING ILEOSTOMY;  Surgeon: Sheldon Standing, MD;  Location: [facility];  Service: General;  Laterality: N/A;   NECK SURGERY      ACDF x 2      done in [place] more than 25 years, doesn't know levels   TOTAL KNEE ARTHROPLASTY     Bilateral x's 2   VAGINAL HYSTERECTOMY  10/17/1998   Renay Mulch   VIDEO BRONCHOSCOPY Bilateral 01/24/2018   Procedure: VIDEO BRONCHOSCOPY WITH FLUORO;  Surgeon: Alaine Vicenta NOVAK, MD;  Location: [facility] ENDOSCOPY;  Service: Cardiopulmonary;  Laterality: Bilateral;   XI ROBOTIC ASSISTED COLOSTOMY TAKEDOWN N/A 08/13/2022   Procedure: ROBOTIC OSTOMY TAKEDOWN, SMALL BOWEL RESECTION X 2, BILATERAL TAP BLOCK, TISSUE PERFUSSION ASSESMENT VIA FIREFLY, MOBILAZATION OF SPLENIC FLEXURE;  Surgeon: Sheldon Standing, MD;  Location: [facility];  Service: General;  Laterality: N/A;   XI ROBOTIC ASSISTED LOWER ANTERIOR RESECTION N/A 03/31/2022   Procedure: XI ROBOTIC ASSISTED LOWER ANTERIOR RESECTION;  Surgeon: Sheldon Standing, MD;  Location: [facility];  Service: General;  Laterality: N/A;    Family History  Problem Relation Age of Onset   Ovarian cancer Mother    Uterine cancer Mother    Lung cancer Father    HIV Brother        83   Kidney cancer Brother    Lung cancer Brother    Other Brother        Mouth Cancer   Colon cancer Maternal Aunt    Heart disease Maternal Grandmother    Stroke Maternal Grandmother    Hypertension Maternal Grandmother    Diabetes Maternal Grandmother    Arthritis Other    Esophageal cancer Neg Hx    Liver disease Neg Hx    Rectal cancer Neg Hx    Stomach cancer Neg Hx     Social History   Socioeconomic History   Marital status: Married    Spouse name: [name]   Number of children: 3   Years of education: Jr college   Highest education level: Not on file  Occupational History   Occupation: Leisure Centre Manager: UNEMPLOYED   Occupation: retired  Tobacco Use   Smoking status: Never   Smokeless tobacco: Never  Vaping Use   Vaping status: Never Used  Substance and Sexual Activity   Alcohol  use: No    Alcohol /week: 0.0 standard drinks of alcohol    Drug use: No   Sexual  activity: Not Currently  Other Topics Concern   Not on file  Social History Narrative   Lives with husband [name]   Caffeine use: coffee (2 cups per day)   Mostly right-handed   Social Drivers of Corporate Investment Banker Strain: Not on file  Food Insecurity: Patient Declined (11/20/2022)   Hunger Vital Sign    Worried About Running Out of Food in the Last Year: Patient declined    Ran Out of Food in the Last Year: Patient declined  Transportation Needs: No Transportation Needs (11/24/2022)   PRAPARE - Administrator, Civil Service (Medical): No    Lack of Transportation (Non-Medical): No  Physical Activity: Not  on file  Stress: Not on file  Social Connections: Not on file  Intimate Partner Violence: Patient Declined (11/20/2022)   Humiliation, Afraid, Rape, and Kick questionnaire    Fear of Current or Ex-Partner: Patient declined    Emotionally Abused: Patient declined    Physically Abused: Patient declined    Sexually Abused: Patient declined    Outpatient Medications Prior to Visit  Medication Sig Dispense Refill   aspirin  EC 81 MG tablet Take 81 mg by mouth 3 (three) times a week. Swallow whole.     benzonatate  (TESSALON ) 200 MG capsule Take 1 capsule (200 mg total) by mouth 3 (three) times daily as needed for cough. 20 capsule 3   diphenoxylate -atropine  (LOMOTIL ) 2.5-0.025 MG tablet Take 1 tablet by mouth daily.     doxycycline  (VIBRA -TABS) 100 MG tablet Take 1 tablet (100 mg total) by mouth 2 (two) times daily. 30 tablet 0   doxycycline  (VIBRAMYCIN ) 100 MG capsule Take 1 capsule (100 mg total) by mouth 2 (two) times daily. 20 capsule 0   ferrous sulfate  325 (65 FE) MG tablet Take 1 tablet (325 mg total) by mouth 2 (two) times daily with a meal. 60 tablet 2   fluticasone  (FLONASE ) 50 MCG/ACT nasal spray Place 2 sprays into both nostrils daily. 16 g 6   furosemide  (LASIX ) 20 MG tablet Take 1 tablet (20 mg total) by mouth daily. (Patient taking differently: Take 20  mg by mouth daily as needed for edema or fluid.) 30 tablet 3   levothyroxine  (SYNTHROID ) 125 MCG tablet Take 1 tablet (125 mcg total) by mouth daily. 90 tablet 3   Multiple Vitamin (MULTIVITAMIN WITH MINERALS) TABS tablet Take 1 tablet by mouth once a week.     OFEV  100 MG CAPS Take 100 mg by mouth 2 (two) times daily.     ondansetron  (ZOFRAN -ODT) 4 MG disintegrating tablet TAKE 1 TABLET BY MOUTH EVERY 8 HOURS AS NEEDED FOR NAUSEA AND VOMITING 60 tablet 2   potassium chloride  SA (KLOR-CON  M) 20 MEQ tablet Take 20 mEq by mouth 3 (three) times a week.     promethazine -dextromethorphan (PROMETHAZINE -DM) 6.25-15 MG/5ML syrup Take 5 mLs by mouth 4 (four) times daily as needed. 118 mL 0   promethazine -dextromethorphan (PROMETHAZINE -DM) 6.25-15 MG/5ML syrup Take 5 mLs by mouth 4 (four) times daily as needed. 118 mL 0   vitamin C  (ASCORBIC ACID ) 500 MG tablet Take 500 mg by mouth 2 (two) times a week.     No facility-administered medications prior to visit.    Allergies  Allergen Reactions   Prednisone  Other (See Comments)    Changed personality    Dilaudid  [Hydromorphone ] Itching and Other (See Comments)    Dilaudid  caused marked confusion   Atrovent  Hfa [Ipratropium Bromide  Hfa] Itching and Other (See Comments)    Pt could not sleep, Insomnia    Cheratussin Ac [Guaifenesin -Codeine ] Other (See Comments)    Headaches    Diltiazem  Itching and Other (See Comments)    Makes patient sick. Shakes. GI   Hydrocodone Itching   Influenza Vaccines Other (See Comments)    Pt reports heart attack after last flu shot   Morphine  Itching   Motrin  [Ibuprofen ] Itching and Other (See Comments)    Gives false reading in blood   Oxycodone  Itching   Codeine  Itching, Nausea Only and Other (See Comments)    Pt takes promethazine  with codeine  at home    Review of Systems  Constitutional:  Positive for chills, fever and malaise/fatigue.  HENT:  Positive for congestion and sinus pain.   Eyes:  Negative for  blurred vision.  Respiratory:  Positive for cough, sputum production and shortness of breath.   Cardiovascular:  Negative for chest pain, palpitations and leg swelling.  Gastrointestinal:  Negative for abdominal pain, blood in stool and nausea.  Genitourinary:  Negative for dysuria and frequency.  Musculoskeletal:  Negative for falls.  Skin:  Negative for rash.  Neurological:  Negative for dizziness, loss of consciousness and headaches.  Endo/Heme/Allergies:  Negative for environmental allergies.  Psychiatric/Behavioral:  Negative for depression. The patient is not nervous/anxious.        Objective:    Physical Exam Vitals and nursing note reviewed.  Neurological:     General: No focal deficit present.  Psychiatric:        Mood and Affect: Mood normal.        Behavior: Behavior normal.        Thought Content: Thought content normal.     There were no vitals taken for this visit. Wt Readings from Last 3 Encounters:  03/19/23 133 lb 12.8 oz (60.7 kg)  03/02/23 131 lb 12.8 oz (59.8 kg)  02/10/23 135 lb (61.2 kg)       Assessment & Plan:  There are no diagnoses linked to this encounter.   I discussed the assessment and treatment plan with the patient. The patient was provided an opportunity to ask questions and all were answered. The patient agreed with the plan and demonstrated an understanding of the instructions.   The patient was advised to call back or seek an in-person evaluation if the symptoms worsen or if the condition fails to improve as anticipated.  [name] Antonio Cyndee, DO [facility] [phone] (phone) [phone] (fax)  [facility]

## 2023-04-20 DIAGNOSIS — R053 Chronic cough: Secondary | ICD-10-CM | POA: Diagnosis not present

## 2023-04-20 DIAGNOSIS — J84112 Idiopathic pulmonary fibrosis: Secondary | ICD-10-CM | POA: Diagnosis not present

## 2023-04-21 ENCOUNTER — Other Ambulatory Visit: Payer: Self-pay | Admitting: Surgery

## 2023-04-21 ENCOUNTER — Encounter: Payer: Self-pay | Admitting: Surgery

## 2023-04-21 DIAGNOSIS — R109 Unspecified abdominal pain: Secondary | ICD-10-CM

## 2023-04-21 DIAGNOSIS — M9902 Segmental and somatic dysfunction of thoracic region: Secondary | ICD-10-CM | POA: Diagnosis not present

## 2023-04-21 DIAGNOSIS — M9901 Segmental and somatic dysfunction of cervical region: Secondary | ICD-10-CM | POA: Diagnosis not present

## 2023-04-21 DIAGNOSIS — M5022 Other cervical disc displacement, mid-cervical region, unspecified level: Secondary | ICD-10-CM | POA: Diagnosis not present

## 2023-04-21 DIAGNOSIS — M9903 Segmental and somatic dysfunction of lumbar region: Secondary | ICD-10-CM | POA: Diagnosis not present

## 2023-04-21 DIAGNOSIS — M5126 Other intervertebral disc displacement, lumbar region: Secondary | ICD-10-CM | POA: Diagnosis not present

## 2023-04-21 DIAGNOSIS — M5124 Other intervertebral disc displacement, thoracic region: Secondary | ICD-10-CM | POA: Diagnosis not present

## 2023-04-22 ENCOUNTER — Other Ambulatory Visit: Payer: Self-pay | Admitting: Surgery

## 2023-04-22 DIAGNOSIS — R109 Unspecified abdominal pain: Secondary | ICD-10-CM

## 2023-04-22 DIAGNOSIS — R1084 Generalized abdominal pain: Secondary | ICD-10-CM

## 2023-04-23 ENCOUNTER — Encounter: Payer: Self-pay | Admitting: Family Medicine

## 2023-04-23 ENCOUNTER — Ambulatory Visit (INDEPENDENT_AMBULATORY_CARE_PROVIDER_SITE_OTHER): Payer: Medicare Other | Admitting: Family Medicine

## 2023-04-23 VITALS — BP 160/80 | HR 68 | Temp 97.8°F | Resp 20 | Ht 64.0 in | Wt 141.0 lb

## 2023-04-23 DIAGNOSIS — R600 Localized edema: Secondary | ICD-10-CM | POA: Diagnosis not present

## 2023-04-23 DIAGNOSIS — R82998 Other abnormal findings in urine: Secondary | ICD-10-CM

## 2023-04-23 LAB — POC URINALSYSI DIPSTICK (AUTOMATED)
Blood, UA: NEGATIVE
Glucose, UA: NEGATIVE
Ketones, UA: NEGATIVE
Nitrite, UA: NEGATIVE
Protein, UA: NEGATIVE
Spec Grav, UA: 1.025 (ref 1.010–1.025)
Urobilinogen, UA: 0.2 U/dL
pH, UA: 5 (ref 5.0–8.0)

## 2023-04-23 NOTE — Progress Notes (Signed)
Established Patient Office Visit  Subjective   Patient ID: Alexandria [name], female    DOB: 10-Feb-1946  Age: 77 y.o. MRN: [id number]  Chief Complaint  Patient presents with   Leg Swelling    Left leg, sxs startes last week.     HPI Discussed the use of AI scribe software for clinical note transcription with the patient, who gave verbal consent to proceed.  History of Present Illness   The patient presents with intermittent leg discomfort, described as a soreness to touch, particularly towards the ankle area. The discomfort is exacerbated by activities such as climbing stairs. This symptom has been occurring on and off for a couple of weeks. The patient also reports a potential hernia affecting her stoma, which is scheduled for a CT scan.  The patient recently visited a pulmonary doctor who ordered a CT scan of the chest. The patient has a history of wearing support stockings daily, which she did not wear on the day of the visit to the pulmonary doctor. The stockings reportedly do not provide relief for the leg discomfort.  The patient also reports issues with her heating system at home, which may be contributing to her overall discomfort. The patient is scheduled for a CT scan of the chest and abdomen on February 10th.      Patient Active Problem List   Diagnosis Date Noted   Suspicious nevus 01/05/2023   Anemia 01/05/2023   Deviated septum 01/05/2023   Episodic tension-type headache, not intractable 11/19/2022   Old myocardial infarction 11/19/2022   Postthrombotic syndrome 11/19/2022   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   Irritable bowel syndrome without diarrhea 11/19/2022   Ileostomy care (HCC) 11/18/2022   Diaper candidiasis 09/28/2022   Ileostomy, has currently (HCC) 09/28/2022   At risk for dehydration 09/16/2022   At high risk for dehydration 09/14/2022   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   Dehydration 08/26/2022   Irritant contact dermatitis associated  with digestive stoma 08/26/2022   Ileostomy in place [facility]) 08/26/2022   High output ileostomy (HCC) 08/25/2022   Diastolic dysfunction 08/14/2022   Chronic facial pain 08/14/2022   Dyspepsia 08/14/2022   Blowout of rectal stump (HCC) 08/14/2022   Diverticulitis of colon 08/13/2022   Colostomy care (HCC) 07/31/2022   Bronchospasm 06/16/2022   Temporal arteritis (HCC) 06/16/2022   Chronic obstructive pulmonary disease with (acute) lower respiratory infection (HCC) 06/16/2022   Angina pectoris with documented spasm (HCC) 06/16/2022   Abdominal aortic aneurysm (AAA) without rupture (HCC) 06/16/2022   Right ankle sprain 06/09/2022   Sprain of right ankle 06/01/2022   Dizziness 06/01/2022   Rectal abscess 06/01/2022   Acute right ankle pain 05/19/2022   History of low anterior resection of rectum 05/04/2022   Irritant contact dermatitis associated with fecal stoma 05/02/2022   Slow transit constipation 05/02/2022   Colostomy complication (HCC) 05/02/2022   IDA (iron deficiency anemia) 04/19/2022   Intra-abdominal abscess (HCC) 04/18/2022   Acute blood loss anemia 04/17/2022   Chronic diastolic CHF (congestive heart failure) (HCC) 04/17/2022   Toxic metabolic encephalopathy 04/17/2022   Ileus following gastrointestinal surgery (HCC) 04/17/2022   Pelvic abscess in female 03/30/2022   Flat foot 11/19/2021   S/P total knee arthroplasty, left 11/13/2021   Ankle impingement syndrome, left 11/13/2021   Rectal bleeding 08/22/2021   Abnormal thyroid blood test 08/22/2021   Abnormal kidney function 08/22/2021   Dumping syndrome 03/13/2020   Arthritis    Bronchial pneumonia    Colon polyp  DVT (deep venous thrombosis) (HCC)    Heart murmur    Migraines    Pancreatitis    Thyroid disease    Aortic root dilatation (HCC) 11/24/2019   Leukocytosis 11/06/2019   Generalized abdominal pain 11/06/2019   Pre-ulcerative corn or callous 09/07/2019   Nausea 09/07/2019   Pain of left calf  05/09/2019   Tibial pain 05/09/2019   Essential hypertension 02/08/2019   Healthcare maintenance 11/10/2018   Bradycardia 07/22/2018   Pansinusitis 06/23/2018   History of transient ischemic attack (TIA) 03/24/2018   IPF (idiopathic pulmonary fibrosis) (HCC) 03/24/2018   Chronic rhinitis 02/16/2018   Eustachian tube dysfunction, bilateral 02/16/2018   URI (upper respiratory infection) 02/16/2018   Seasonal allergic rhinitis due to pollen 02/16/2018   ILD (interstitial lung disease) (HCC) 01/07/2018   Dyspnea 01/07/2018   Acute respiratory failure (HCC) 01/06/2018   TIA (transient ischemic attack) 01/06/2018   Hypokalemia 12/15/2017   Chronic cough 11/26/2017   Basal cell carcinoma (BCC) of neck 06/07/2017   Anisometropia 06/02/2017   Drusen of macula of both eyes 06/02/2017   Photopsia of left eye 06/02/2017   Cardiac murmur 05/07/2017   Situational anxiety 05/07/2017   Headache 05/07/2017   Mitral and aortic insufficiency 05/07/2017   Migraine without status migrainosus, not intractable 06/09/2016   Insomnia 11/18/2015   Normal coronary arteries 05/20/2015   RBBB 05/20/2015   Abnormal CT of the chest 05/20/2015   Hx of myocardial infarction 05/08/2015   Bruising 05/08/2015   Upper airway cough syndrome 02/14/2015   Asthmatic bronchitis with acute exacerbation 02/06/2015   Mild intermittent asthma without complication 02/06/2015   Oliguria 12/28/2014   Hypothyroidism 10/25/2014   Fatigue 10/25/2014   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   Chronic venous insufficiency 10/24/2014   Varicose veins of leg with complications 10/24/2014   Degeneration of intervertebral disc of cervical region 06/29/2014   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   Generalized osteoarthritis of multiple sites 06/29/2014   History of stroke without residual deficits 06/29/2014   Palpitations 11/08/2013   Edema of lower extremity 10/03/2013   Hair loss 11/25/2012   Alopecia  11/25/2012   Diverticulitis of large intestine with perforation and abscess 07/14/2012   Hammer toe 06/13/2012   History of stress test 05/21/2011   Hx of echocardiogram 01/27/2010   H/O cardiac catheterization 2004   Past Medical History:  Diagnosis Date   Arthritis    Bronchial pneumonia    Chronic facial pain 08/14/2022   Colon polyp    Diverticulitis    DVT (deep venous thrombosis) (HCC)    lower extremity   H/O cardiac catheterization 2004   Normal coronary arteries   Heart murmur    History of stress test 05/21/2011   Hx of echocardiogram 01/27/2010   Normal Ef 55% the transmitral spectral doppler flow pattern is normal for age. the left ventricular wall motion is normal   Hypothyroidism    IPF (idiopathic pulmonary fibrosis) (HCC) 01/2018   Myocardial infarction (HCC)    hx of 30 years ago   Pancreatitis    TIA (transient ischemic attack)    8 years ago   Past Surgical History:  Procedure Laterality Date   BREAST BIOPSY Left    [facility]   CARDIAC CATHETERIZATION     10/2013   CATARACT EXTRACTION Bilateral 03/22/2018   FLEXIBLE SIGMOIDOSCOPY N/A 08/13/2022   Procedure: FLEXIBLE SIGMOIDOSCOPY;  Surgeon: Karie Soda, MD;  Location: [facility];  Service: General;  Laterality: N/A;   ILEOSTOMY CLOSURE N/A  11/19/2022   Procedure: OPEN TAKEDOWN OF LOOP ILEOSTOMY;  Surgeon: Karie Soda, MD;  Location: [facility];  Service: General;  Laterality: N/A;   LEFT HEART CATHETERIZATION WITH CORONARY ANGIOGRAM N/A 10/25/2013   Procedure: LEFT HEART CATHETERIZATION WITH CORONARY ANGIOGRAM;  Surgeon: Lennette Bihari, MD;  Location: [facility] CATH LAB;  Service: Cardiovascular;  Laterality: N/A;   LYSIS OF ADHESION N/A 08/13/2022   Procedure: LYSIS OF ADHESION, DIVERTING ILEOSTOMY;  Surgeon: Karie Soda, MD;  Location: [facility];  Service: General;  Laterality: N/A;   NECK SURGERY     ACDF x 2      done in [place] more than 25 years, doesn't know levels   TOTAL KNEE ARTHROPLASTY     Bilateral x's  2   VAGINAL HYSTERECTOMY  10/17/1998   Konrad Dolores   VIDEO BRONCHOSCOPY Bilateral 01/24/2018   Procedure: VIDEO BRONCHOSCOPY WITH FLUORO;  Surgeon: Lupita Leash, MD;  Location: [facility] ENDOSCOPY;  Service: Cardiopulmonary;  Laterality: Bilateral;   XI ROBOTIC ASSISTED COLOSTOMY TAKEDOWN N/A 08/13/2022   Procedure: ROBOTIC OSTOMY TAKEDOWN, SMALL BOWEL RESECTION X 2, BILATERAL TAP BLOCK, TISSUE PERFUSSION ASSESMENT VIA FIREFLY, MOBILAZATION OF SPLENIC FLEXURE;  Surgeon: Karie Soda, MD;  Location: [facility];  Service: General;  Laterality: N/A;   XI ROBOTIC ASSISTED LOWER ANTERIOR RESECTION N/A 03/31/2022   Procedure: XI ROBOTIC ASSISTED LOWER ANTERIOR RESECTION;  Surgeon: Karie Soda, MD;  Location: [facility];  Service: General;  Laterality: N/A;   Social History   Tobacco Use   Smoking status: Never   Smokeless tobacco: Never  Vaping Use   Vaping status: Never Used  Substance Use Topics   Alcohol use: No    Alcohol/week: 0.0 standard drinks of alcohol   Drug use: No   Social History   Socioeconomic History   Marital status: Married    Spouse name: Tasia Catchings   Number of children: 3   Years of education: Jr college   Highest education level: Not on file  Occupational History   Occupation: Leisure centre manager: UNEMPLOYED   Occupation: retired  Tobacco Use   Smoking status: Never   Smokeless tobacco: Never  Vaping Use   Vaping status: Never Used  Substance and Sexual Activity   Alcohol use: No    Alcohol/week: 0.0 standard drinks of alcohol   Drug use: No   Sexual activity: Not Currently  Other Topics Concern   Not on file  Social History Narrative   Lives with husband Tasia Catchings   Caffeine use: coffee (2 cups per day)   Mostly right-handed   Social Drivers of Corporate investment banker Strain: Not on file  Food Insecurity: Low Risk  (04/20/2023)   Received from [facility]   Hunger Vital Sign    Worried About Running Out of Food in the Last Year: Never true    Ran Out of  Food in the Last Year: Never true  Transportation Needs: No Transportation Needs (04/20/2023)   Received from [facility]    In the past 12 months, has lack of reliable transportation kept you from medical appointments, meetings, work or from getting things needed for daily living? : No  Physical Activity: Not on file  Stress: Not on file  Social Connections: Not on file  Intimate Partner Violence: Patient Declined (11/20/2022)   Humiliation, Afraid, Rape, and Kick questionnaire    Fear of Current or Ex-Partner: Patient declined    Emotionally Abused: Patient declined    Physically  Abused: Patient declined    Sexually Abused: Patient declined   Family Status  Relation Name Status   Mother  Deceased   Father  Deceased   Brother  (Not Specified)   Brother  (Not Specified)   Brother  (Not Specified)   Brother  (Not Specified)   Brother  (Not Specified)   Mat Aunt  (Not Specified)   MGM  (Not Specified)   Other  (Not Specified)   Neg Hx  (Not Specified)  No partnership data on file   Family History  Problem Relation Age of Onset   Ovarian cancer Mother    Uterine cancer Mother    Lung cancer Father    HIV Brother        1982   Kidney cancer Brother    Lung cancer Brother    Other Brother        Mouth Cancer   Colon cancer Maternal Aunt    Heart disease Maternal Grandmother    Stroke Maternal Grandmother    Hypertension Maternal Grandmother    Diabetes Maternal Grandmother    Arthritis Other    Esophageal cancer Neg Hx    Liver disease Neg Hx    Rectal cancer Neg Hx    Stomach cancer Neg Hx    Allergies  Allergen Reactions   Prednisone Other (See Comments)    Changed personality    Dilaudid [Hydromorphone] Itching and Other (See Comments)    Dilaudid caused marked confusion   Atrovent Hfa [Ipratropium Bromide Hfa] Itching and Other (See Comments)    Pt could not sleep, Insomnia    Cheratussin Ac [Guaifenesin-Codeine] Other (See Comments)     Headaches    Diltiazem Itching and Other (See Comments)    Makes patient sick. Shakes. GI   Hydrocodone Itching   Influenza Vaccines Other (See Comments)    Pt reports heart attack after last flu shot   Morphine Itching   Motrin [Ibuprofen] Itching and Other (See Comments)    "Gives false reading in blood"   Oxycodone Itching   Codeine Itching, Nausea Only and Other (See Comments)    Pt takes promethazine with codeine at home      Review of Systems  Constitutional:  Negative for fever.  HENT:  Negative for congestion.   Eyes:  Negative for blurred vision.  Respiratory:  Negative for cough.   Cardiovascular:  Positive for leg swelling. Negative for chest pain and palpitations.  Gastrointestinal:  Negative for vomiting.  Musculoskeletal:  Negative for back pain.  Skin:  Negative for rash.  Neurological:  Negative for loss of consciousness and headaches.      Objective:     BP (!) 160/80 (BP Location: Left Arm, Patient Position: Sitting, Cuff Size: Normal)   Pulse 68   Temp 97.8 F (36.6 C) (Oral)   Resp 20   Ht 5\' 4"  (1.626 m)   Wt 141 lb (64 kg)   SpO2 92%   BMI 24.20 kg/m  BP Readings from Last 3 Encounters:  04/23/23 (!) 160/80  03/19/23 (!) 140/80  03/03/23 (!) 150/90   Wt Readings from Last 3 Encounters:  04/23/23 141 lb (64 kg)  03/19/23 133 lb 12.8 oz (60.7 kg)  03/02/23 131 lb 12.8 oz (59.8 kg)   SpO2 Readings from Last 3 Encounters:  04/23/23 92%  03/19/23 97%  03/03/23 98%      Physical Exam Vitals and nursing note reviewed.  Constitutional:      General: She is not  in acute distress.    Appearance: Normal appearance. She is well-developed.  HENT:     Head: Normocephalic and atraumatic.  Eyes:     General: No scleral icterus.       Right eye: No discharge.        Left eye: No discharge.  Cardiovascular:     Rate and Rhythm: Normal rate and regular rhythm.     Heart sounds: No murmur heard. Pulmonary:     Effort: Pulmonary effort is  normal. No respiratory distress.     Breath sounds: Normal breath sounds.  Musculoskeletal:        General: Swelling present. Normal range of motion.     Cervical back: Normal range of motion and neck supple.     Right lower leg: Swelling present. 2+ Pitting Edema present.     Left lower leg: Swelling present. 2+ Pitting Edema present.  Skin:    General: Skin is warm and dry.  Neurological:     Mental Status: She is alert and oriented to person, place, and time.  Psychiatric:        Mood and Affect: Mood normal.        Behavior: Behavior normal.        Thought Content: Thought content normal.        Judgment: Judgment normal.      Results for orders placed or performed in visit on 04/23/23  POCT Urinalysis Dipstick (Automated)  Result Value Ref Range   Color, UA yellow    Clarity, UA clear    Glucose, UA Negative Negative   Bilirubin, UA small    Ketones, UA Negative    Spec Grav, UA 1.025 1.010 - 1.025   Blood, UA Negative    pH, UA 5.0 5.0 - 8.0   Protein, UA Negative Negative   Urobilinogen, UA 0.2 0.2 or 1.0 E.U./dL   Nitrite, UA Negative    Leukocytes, UA Trace (A) Negative    Last CBC Lab Results  Component Value Date   WBC 5.9 01/06/2023   HGB 12.4 01/06/2023   HCT 37.9 01/06/2023   MCV 97.8 01/06/2023   MCH 31.3 12/04/2022   RDW 14.9 01/06/2023   PLT 246.0 01/06/2023   Last metabolic panel Lab Results  Component Value Date   GLUCOSE 92 02/05/2023   NA 140 02/05/2023   K 3.9 02/05/2023   CL 102 02/05/2023   CO2 29 02/05/2023   BUN 11 02/05/2023   CREATININE 0.88 02/05/2023   GFR 64.03 11/26/2022   CALCIUM 9.8 02/05/2023   PHOS 2.4 (L) 04/21/2022   PROT 7.5 02/05/2023   ALBUMIN 3.7 11/26/2022   BILITOT 0.6 02/05/2023   ALKPHOS 58 11/26/2022   AST 17 02/05/2023   ALT 9 02/05/2023   ANIONGAP 11 11/20/2022   Last lipids Lab Results  Component Value Date   CHOL 177 12/04/2022   HDL 44 (L) 12/04/2022   LDLCALC 113 (H) 12/04/2022   TRIG 95  12/04/2022   CHOLHDL 4.0 12/04/2022   Last hemoglobin A1c Lab Results  Component Value Date   HGBA1C 5.4 04/05/2022   Last thyroid functions Lab Results  Component Value Date   TSH 54.64 (H) 12/04/2022   T4TOTAL 11.0 08/22/2021   Last vitamin D Lab Results  Component Value Date   VD25OH 29.9 (L) 05/09/2019   Last vitamin B12 and Folate Lab Results  Component Value Date   VITAMINB12 640 04/18/2022   FOLATE 10.8 04/18/2022      The  ASCVD Risk score (Arnett DK, et al., 2019) failed to calculate for the following reasons:   Risk score cannot be calculated because patient has a medical history suggesting prior/existing ASCVD    Assessment & Plan:   Problem List Items Addressed This Visit   None Visit Diagnoses       Lower extremity edema    -  Primary   Relevant Orders   POCT Urinalysis Dipstick (Automated) (Completed)   Comprehensive metabolic panel     Urine leukocytes increased       Relevant Orders   Urine Culture     Assessment and Plan    Leg Pain   Intermittent leg pain around the ankle is sore to touch and worsens with walking and climbing stairs. Symptoms have been present intermittently for weeks. She has not worn support stockings, which are usually worn daily, though these do not typically help. The stoma may be affected by the leg pain. Encourage daily use of support stockings and monitor symptoms, reporting any changes.  Potential Hernia   A possible hernia requires a CT scan of the abdomen. The stoma may be affected by the hernia. A CT scan is scheduled for February 10th, and it is essential to drink the contrast solution before the scan.  Pulmonary Evaluation   A pulmonary doctor recommended a CT scan of the chest, also scheduled for February 10th. Drinking the contrast solution before the scan is necessary.  Kidney Function Monitoring   Kidney function requires monitoring. She has not taken Lasix recently due to social activities but plans to  resume taking half a pill. Kidney function will be checked through blood tests, and the response to Lasix will be monitored to adjust the dosage as needed.  General Health Maintenance   There was a discussion about regular check-ups and screenings. She is considering switching to a new healthcare provider closer to home due to dissatisfaction with the current provider and a preference for a doctor over a PA. Encourage regular follow-ups with a new healthcare provider and discuss the benefits of switching to a provider closer to home.  Follow-up   Follow-up after CT scans to discuss results. Monitor kidney function and adjust treatment as necessary.        Return if symptoms worsen or fail to improve.    Donato Schultz, DO

## 2023-04-23 NOTE — Patient Instructions (Signed)
Edema  Edema is an abnormal buildup of fluids in the body tissues and under the skin. Swelling of the legs, feet, and ankles is a common symptom that becomes more likely as you get older. Swelling is also common in looser tissues, such as around the eyes. Pressing on the area may make a temporary dent in your skin (pitting edema). This fluid may also accumulate in your lungs (pulmonary edema). There are many possible causes of edema. Eating too much salt (sodium) and being on your feet or sitting for a long time can cause edema in your legs, feet, and ankles. Common causes of edema include: Certain medical conditions, such as heart failure, liver or kidney disease, and cancer. Weak leg blood vessels. An injury. Pregnancy. Medicines. Being obese. Low protein levels in the blood. Hot weather may make edema worse. Edema is usually painless. Your skin may look swollen or shiny. Follow these instructions at home: Medicines Take over-the-counter and prescription medicines only as told by your health care provider. Your health care provider may prescribe a medicine to help your body get rid of extra water (diuretic). Take this medicine if you are told to take it. Eating and drinking Eat a low-salt (low-sodium) diet to reduce fluid as told by your health care provider. Sometimes, eating less salt may reduce swelling. Depending on the cause of your swelling, you may need to limit how much fluid you drink (fluid restriction). General instructions Raise (elevate) the injured area above the level of your heart while you are sitting or lying down. Do not sit still or stand for long periods of time. Do not wear tight clothing. Do not wear garters on your upper legs. Exercise your legs to get your circulation going. This helps to move the fluid back into your blood vessels, and it may help the swelling go down. Wear compression stockings as told by your health care provider. These stockings help to prevent  blood clots and reduce swelling in your legs. It is important that these are the correct size. These stockings should be prescribed by your health care provider to prevent possible injuries. If elastic bandages or wraps are recommended, use them as told by your health care provider. Contact a health care provider if: Your edema does not get better with treatment. You have heart, liver, or kidney disease and have symptoms of edema. You have sudden and unexplained weight gain. Get help right away if: You develop shortness of breath or chest pain. You cannot breathe when you lie down. You develop pain, redness, or warmth in the swollen areas. You have heart, liver, or kidney disease and suddenly get edema. You have a fever and your symptoms suddenly get worse. These symptoms may be an emergency. Get help right away. Call 911. Do not wait to see if the symptoms will go away. Do not drive yourself to the hospital. Summary Edema is an abnormal buildup of fluids in the body tissues and under the skin. Eating too much salt (sodium)and being on your feet or sitting for a long time can cause edema in your legs, feet, and ankles. Raise (elevate) the injured area above the level of your heart while you are sitting or lying down. Follow your health care provider's instructions about diet and how much fluid you can drink. This information is not intended to replace advice given to you by your health care provider. Make sure you discuss any questions you have with your health care provider. Document Revised: 11/25/2020 Document  Reviewed: 11/25/2020 Elsevier Patient Education  2024 [employer].

## 2023-04-24 LAB — COMPREHENSIVE METABOLIC PANEL
AG Ratio: 1.3 (calc) (ref 1.0–2.5)
ALT: 10 U/L (ref 6–29)
AST: 18 U/L (ref 10–35)
Albumin: 4 g/dL (ref 3.6–5.1)
Alkaline phosphatase (APISO): 76 U/L (ref 37–153)
BUN: 19 mg/dL (ref 7–25)
CO2: 28 mmol/L (ref 20–32)
Calcium: 9.3 mg/dL (ref 8.6–10.4)
Chloride: 102 mmol/L (ref 98–110)
Creat: 0.8 mg/dL (ref 0.60–1.00)
Globulin: 3 g/dL (ref 1.9–3.7)
Glucose, Bld: 89 mg/dL (ref 65–99)
Potassium: 4 mmol/L (ref 3.5–5.3)
Sodium: 138 mmol/L (ref 135–146)
Total Bilirubin: 0.6 mg/dL (ref 0.2–1.2)
Total Protein: 7 g/dL (ref 6.1–8.1)

## 2023-04-24 LAB — URINE CULTURE
MICRO NUMBER:: 15970296
Result:: NO GROWTH
SPECIMEN QUALITY:: ADEQUATE

## 2023-04-25 ENCOUNTER — Encounter: Payer: Self-pay | Admitting: Family Medicine

## 2023-04-26 ENCOUNTER — Other Ambulatory Visit: Payer: Self-pay | Admitting: Family Medicine

## 2023-04-26 DIAGNOSIS — R052 Subacute cough: Secondary | ICD-10-CM

## 2023-04-26 DIAGNOSIS — R053 Chronic cough: Secondary | ICD-10-CM | POA: Diagnosis not present

## 2023-04-26 DIAGNOSIS — I10 Essential (primary) hypertension: Secondary | ICD-10-CM | POA: Diagnosis not present

## 2023-04-27 ENCOUNTER — Ambulatory Visit (INDEPENDENT_AMBULATORY_CARE_PROVIDER_SITE_OTHER): Payer: Medicare Other | Admitting: Family Medicine

## 2023-04-27 VITALS — BP 190/80 | HR 79 | Resp 22 | Ht 64.0 in | Wt 134.0 lb

## 2023-04-27 DIAGNOSIS — R053 Chronic cough: Secondary | ICD-10-CM

## 2023-04-27 DIAGNOSIS — I10 Essential (primary) hypertension: Secondary | ICD-10-CM | POA: Diagnosis not present

## 2023-04-27 MED ORDER — LOSARTAN POTASSIUM 50 MG PO TABS: 50.0000 mg | ORAL_TABLET | Freq: Every day | ORAL | 1 refills | Status: DC

## 2023-04-27 MED ORDER — HYDROCOD POLI-CHLORPHE POLI ER 10-8 MG/5ML PO SUER: 5.0000 mL | Freq: Two times a day (BID) | ORAL | 0 refills | Status: DC | PRN

## 2023-04-27 NOTE — Progress Notes (Signed)
   Established Patient Office Visit  Subjective   Patient ID: Alexandria [name], female    DOB: 1946-02-06  Age: 78 y.o. MRN: [id number]  Chief Complaint  Patient presents with   Cough    HPI    ROS    Objective:     BP (!) 190/80   Pulse 79   Resp (!) 22   Ht 5\' 4"  (1.626 m)   Wt 134 lb (60.8 kg)   SpO2 93%   BMI 23.00 kg/m    Physical Exam   No results found for any visits on 04/27/23.    The ASCVD Risk score (Arnett DK, et al., 2019) failed to calculate for the following reasons:   Risk score cannot be calculated because patient has a medical history suggesting prior/existing ASCVD    Assessment & Plan:   Problem List Items Addressed This Visit       Unprioritized   Chronic cough - Primary   Relevant Medications   chlorpheniramine-HYDROcodone (TUSSIONEX) 10-8 MG/5ML   Other Visit Diagnoses       Primary hypertension       Relevant Medications   losartan (COZAAR) 50 MG tablet       No follow-ups on file.    Donato Schultz, DO

## 2023-04-28 DIAGNOSIS — M9903 Segmental and somatic dysfunction of lumbar region: Secondary | ICD-10-CM | POA: Diagnosis not present

## 2023-04-28 DIAGNOSIS — M5124 Other intervertebral disc displacement, thoracic region: Secondary | ICD-10-CM | POA: Diagnosis not present

## 2023-04-28 DIAGNOSIS — M5126 Other intervertebral disc displacement, lumbar region: Secondary | ICD-10-CM | POA: Diagnosis not present

## 2023-04-28 DIAGNOSIS — M9901 Segmental and somatic dysfunction of cervical region: Secondary | ICD-10-CM | POA: Diagnosis not present

## 2023-04-28 DIAGNOSIS — M9902 Segmental and somatic dysfunction of thoracic region: Secondary | ICD-10-CM | POA: Diagnosis not present

## 2023-04-28 DIAGNOSIS — M5022 Other cervical disc displacement, mid-cervical region, unspecified level: Secondary | ICD-10-CM | POA: Diagnosis not present

## 2023-04-29 ENCOUNTER — Telehealth: Payer: Self-pay

## 2023-04-29 NOTE — Telephone Encounter (Signed)
Copied from CRM [id number]. Topic: Clinical - Medical Advice >> Apr 29, 2023 10:34 AM Alexandria [name] wrote: Reason for CRM: Patient would like to inform Dr.Lowne that she was correct, her coughing is from acid reflux. Patient would like to know what she can take OTC for the acid reflux coughing.

## 2023-04-30 ENCOUNTER — Encounter: Payer: Self-pay | Admitting: Family Medicine

## 2023-04-30 NOTE — Telephone Encounter (Signed)
Pt made aware

## 2023-05-04 DIAGNOSIS — H35432 Paving stone degeneration of retina, left eye: Secondary | ICD-10-CM | POA: Diagnosis not present

## 2023-05-04 DIAGNOSIS — H43813 Vitreous degeneration, bilateral: Secondary | ICD-10-CM | POA: Diagnosis not present

## 2023-05-06 DIAGNOSIS — Z981 Arthrodesis status: Secondary | ICD-10-CM | POA: Diagnosis not present

## 2023-05-06 DIAGNOSIS — I700 Atherosclerosis of aorta: Secondary | ICD-10-CM | POA: Diagnosis not present

## 2023-05-06 DIAGNOSIS — J84112 Idiopathic pulmonary fibrosis: Secondary | ICD-10-CM | POA: Diagnosis not present

## 2023-05-06 DIAGNOSIS — R053 Chronic cough: Secondary | ICD-10-CM | POA: Diagnosis not present

## 2023-05-06 DIAGNOSIS — R918 Other nonspecific abnormal finding of lung field: Secondary | ICD-10-CM | POA: Diagnosis not present

## 2023-05-10 DIAGNOSIS — M9901 Segmental and somatic dysfunction of cervical region: Secondary | ICD-10-CM | POA: Diagnosis not present

## 2023-05-10 DIAGNOSIS — M9903 Segmental and somatic dysfunction of lumbar region: Secondary | ICD-10-CM | POA: Diagnosis not present

## 2023-05-10 DIAGNOSIS — M5124 Other intervertebral disc displacement, thoracic region: Secondary | ICD-10-CM | POA: Diagnosis not present

## 2023-05-10 DIAGNOSIS — M5022 Other cervical disc displacement, mid-cervical region, unspecified level: Secondary | ICD-10-CM | POA: Diagnosis not present

## 2023-05-10 DIAGNOSIS — M5126 Other intervertebral disc displacement, lumbar region: Secondary | ICD-10-CM | POA: Diagnosis not present

## 2023-05-10 DIAGNOSIS — M9902 Segmental and somatic dysfunction of thoracic region: Secondary | ICD-10-CM | POA: Diagnosis not present

## 2023-05-11 DIAGNOSIS — C44319 Basal cell carcinoma of skin of other parts of face: Secondary | ICD-10-CM | POA: Diagnosis not present

## 2023-05-17 ENCOUNTER — Other Ambulatory Visit: Payer: Medicare Other

## 2023-05-18 DIAGNOSIS — I700 Atherosclerosis of aorta: Secondary | ICD-10-CM | POA: Diagnosis not present

## 2023-05-18 DIAGNOSIS — J8410 Pulmonary fibrosis, unspecified: Secondary | ICD-10-CM | POA: Diagnosis not present

## 2023-05-18 DIAGNOSIS — J84112 Idiopathic pulmonary fibrosis: Secondary | ICD-10-CM | POA: Diagnosis not present

## 2023-05-18 DIAGNOSIS — L905 Scar conditions and fibrosis of skin: Secondary | ICD-10-CM | POA: Diagnosis not present

## 2023-05-18 DIAGNOSIS — R053 Chronic cough: Secondary | ICD-10-CM | POA: Diagnosis not present

## 2023-05-19 ENCOUNTER — Ambulatory Visit
Admission: RE | Admit: 2023-05-19 | Discharge: 2023-05-19 | Disposition: A | Discharge status: Home / Self Care | Payer: Medicare Other | Source: Ambulatory Visit | Attending: Surgery | Admitting: Surgery

## 2023-05-19 DIAGNOSIS — R1084 Generalized abdominal pain: Secondary | ICD-10-CM

## 2023-05-19 DIAGNOSIS — R109 Unspecified abdominal pain: Secondary | ICD-10-CM

## 2023-05-19 MED ORDER — IOPAMIDOL (ISOVUE-300) INJECTION 61%
80.0000 mL | Freq: Once | INTRAVENOUS | Status: AC | PRN
  Administered 2023-05-19: 80 mL via INTRAVENOUS

## 2023-05-20 ENCOUNTER — Encounter: Payer: Self-pay | Admitting: Family Medicine

## 2023-05-20 ENCOUNTER — Ambulatory Visit (INDEPENDENT_AMBULATORY_CARE_PROVIDER_SITE_OTHER): Payer: Medicare Other | Admitting: Family Medicine

## 2023-05-20 VITALS — BP 140/90 | HR 82 | Temp 98.0°F | Resp 20 | Ht 64.0 in | Wt 137.2 lb

## 2023-05-20 DIAGNOSIS — J189 Pneumonia, unspecified organism: Secondary | ICD-10-CM

## 2023-05-20 MED ORDER — AMOXICILLIN-POT CLAVULANATE 875-125 MG PO TABS
1.0000 | ORAL_TABLET | Freq: Two times a day (BID) | ORAL | 0 refills | Status: DC
Start: 2023-05-20 — End: 2023-06-23

## 2023-05-20 MED ORDER — AZITHROMYCIN 250 MG PO TABS: ORAL_TABLET | ORAL | 0 refills | Status: DC
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
## 2023-05-20 NOTE — Patient Instructions (Signed)
Community-Acquired Pneumonia, Adult Pneumonia is a lung infection that causes inflammation and the buildup of mucus and fluids in the lungs. This may cause coughing and difficulty breathing. Community-acquired pneumonia is pneumonia that develops in people who are not, and have not recently been, in a hospital or other health care facility. Usually, pneumonia develops as a result of an illness that is caused by a virus, such as the common cold and the flu (influenza). It can also be caused by bacteria or fungi. While the common cold and influenza can pass from person to person (are contagious), pneumonia itself is not considered contagious. What are the causes? This condition may be caused by: Viruses. Bacteria. Fungi. What increases the risk? The following factors may make you more likely to develop this condition: Being over age 17 or having certain medical conditions, such as: A long-term (chronic) disease, such as: chronic obstructive pulmonary disease (COPD), asthma, heart failure, diabetes, or kidney disease. A condition that increases the risk of breathing in (aspirating) mucus and other fluids from your mouth and nose. A weakened body defense system (immune system). Having had your spleen removed (splenectomy). The spleen is the organ that helps fight germs and infections. Not cleaning your teeth and gums well (poor dental hygiene). Using tobacco products. Traveling to places where germs that cause pneumonia are present or being near certain animals or animal habitats that could have germs that cause pneumonia. What are the signs or symptoms? Symptoms of this condition include: A dry cough or a wet (productive) cough. A fever, sweating, or chills. Chest pain, especially when breathing deeply or coughing. Fast breathing, difficulty breathing, or shortness of breath. Tiredness (fatigue) and muscle aches. How is this diagnosed? This condition may be diagnosed based on your medical  history or a physical exam. You may also have tests, including: Imaging, such as a chest X-ray or lung ultrasound. Tests of: The level of oxygen and other gases in your blood. Mucus from your lungs (sputum). Fluid around your lungs (pleural fluid). Your urine. How is this treated? Treatment for this condition depends on many factors, such as the cause of your pneumonia, your medicines, and other medical conditions that you have. For most adults, pneumonia may be treated at home. In some cases, treatment must happen in a hospital and may include: Medicines that are given by mouth (orally) or through an IV, including: Antibiotic medicines, if bacteria caused the pneumonia. Medicines that kill viruses (antiviral medicines), if a virus caused the pneumonia. Oxygen therapy. Severe pneumonia, although rare, may require the following treatments: Mechanical ventilation.This procedure uses a machine to help you breathe if you cannot breathe well on your own or maintain a safe level of blood oxygen. Thoracentesis. This procedure removes any buildup of pleural fluid to help with breathing. Follow these instructions at home:  Medicines Take over-the-counter and prescription medicines only as told by your health care provider. Take cough medicine only if you have trouble sleeping. Cough medicine can prevent your body from removing mucus from your lungs. If you were prescribed antibiotics, take them as told by your health care provider. Do not stop taking the antibiotic even if you start to feel better. Lifestyle     Do not drink alcohol. Do not use any products that contain nicotine or tobacco. These products include cigarettes, chewing tobacco, and vaping devices, such as e-cigarettes. If you need help quitting, ask your health care provider. Eat a healthy diet. This includes plenty of vegetables, fruits, whole grains, low-fat  dairy products, and lean protein. General instructions Rest a lot and  get at least 8 hours of sleep each night. Sleep in a partly upright position at night. Place a few pillows under your head or sleep in a reclining chair. Return to your normal activities as told by your health care provider. Ask your health care provider what activities are safe for you. Drink enough fluid to keep your urine pale yellow. This helps to thin the mucus in your lungs. If your throat is sore, gargle with a mixture of salt and water 3-4 times a day or as needed. To make salt water, completely dissolve -1 tsp (3-6 g) of salt in 1 cup (237 mL) of warm water. Keep all follow-up visits. How is this prevented? You can lower your risk of developing community-acquired pneumonia by: Getting the pneumonia vaccine. There are different types and schedules of pneumonia vaccines. Ask your health care provider which option is best for you. Consider getting the pneumonia vaccine if: You are older than 79 years of age. You are 60-63 years of age and are receiving cancer treatment, have chronic lung disease, or have other medical conditions that affect your immune system. Ask your health care provider if this applies to you. Getting your influenza vaccine every year. Ask your health care provider which type of vaccine is best for you. Getting regular dental checkups. Washing your hands often with soap and water for at least 20 seconds. If soap and water are not available, use hand sanitizer. Contact a health care provider if: You have a fever. You have trouble sleeping because you cannot control your cough with cough medicine. Get help right away if: Your shortness of breath becomes worse. Your chest pain increases. Your sickness becomes worse, especially if you are an older adult or have a weak immune system. You cough up blood. These symptoms may be an emergency. Get help right away. Call 911. Do not wait to see if the symptoms will go away. Do not drive yourself to the  hospital. Summary Pneumonia is an infection of the lungs. Community-acquired pneumonia develops in people who have not been in the hospital. It can be caused by bacteria, viruses, or fungi. This condition may be treated with antibiotics or antiviral medicines. Severe pneumonia may require a hospital stay and treatment to help with breathing. This information is not intended to replace advice given to you by your health care provider. Make sure you discuss any questions you have with your health care provider. Document Revised: 12/17/2022 Document Reviewed: 05/21/2021 Elsevier Patient Education  2024 [employer].

## 2023-05-21 ENCOUNTER — Other Ambulatory Visit: Payer: Self-pay | Admitting: Family Medicine

## 2023-05-21 ENCOUNTER — Telehealth: Payer: Self-pay

## 2023-05-21 DIAGNOSIS — R053 Chronic cough: Secondary | ICD-10-CM

## 2023-05-21 MED ORDER — BENZONATATE 200 MG PO CAPS
200.0000 mg | ORAL_CAPSULE | Freq: Three times a day (TID) | ORAL | 3 refills | Status: DC | PRN
Start: 2023-05-21 — End: 2023-06-17

## 2023-05-21 NOTE — Telephone Encounter (Signed)
Copied from CRM [id number]. Topic: Clinical - Medication Question >> May 21, 2023  3:41 PM Almira Coaster wrote: Reason for CRM: Patient is calling because she went to the pharmacy to pick up the cough pearls; however, she was advised that no prescription was placed. Patient states Dr.Lowne was suppose to send a prescription yesterday for them.

## 2023-05-25 ENCOUNTER — Ambulatory Visit: Payer: Self-pay | Admitting: Family Medicine

## 2023-05-25 ENCOUNTER — Ambulatory Visit (HOSPITAL_BASED_OUTPATIENT_CLINIC_OR_DEPARTMENT_OTHER)
Admission: RE | Admit: 2023-05-25 | Discharge: 2023-05-25 | Disposition: A | Discharge status: Home / Self Care | Payer: Medicare Other | Source: Ambulatory Visit | Attending: Physician Assistant | Admitting: Physician Assistant

## 2023-05-25 ENCOUNTER — Ambulatory Visit (INDEPENDENT_AMBULATORY_CARE_PROVIDER_SITE_OTHER): Payer: Medicare Other | Admitting: Physician Assistant

## 2023-05-25 VITALS — BP 166/81 | HR 65 | Temp 97.7°F | Ht 64.0 in | Wt 140.4 lb

## 2023-05-25 DIAGNOSIS — J189 Pneumonia, unspecified organism: Secondary | ICD-10-CM | POA: Insufficient documentation

## 2023-05-25 DIAGNOSIS — I700 Atherosclerosis of aorta: Secondary | ICD-10-CM | POA: Diagnosis not present

## 2023-05-25 DIAGNOSIS — J849 Interstitial pulmonary disease, unspecified: Secondary | ICD-10-CM | POA: Diagnosis not present

## 2023-05-25 DIAGNOSIS — J40 Bronchitis, not specified as acute or chronic: Secondary | ICD-10-CM | POA: Diagnosis not present

## 2023-05-25 MED ORDER — PROMETHAZINE-DM 6.25-15 MG/5ML PO SYRP: 5.0000 mL | ORAL_SOLUTION | Freq: Four times a day (QID) | ORAL | 0 refills | Status: DC | PRN

## 2023-05-25 NOTE — Telephone Encounter (Signed)
Copied from CRM [id number]. Topic: Clinical - Red Word Triage >> May 25, 2023 10:11 AM Denese Killings wrote: Red Word that prompted transfer to Nurse Triage: Patient has pneumonia and woke up this morning with the sweats and still coughing. Patient thinks she may need cough syrup.   Chief Complaint: Cough Symptoms: Cough, sweats, mild shortness of breath  Frequency: Frequent  Disposition: [] ED /[] Urgent Care (no appt availability in office) / [x] Appointment(In office/virtual)/ []  [facility]/ [] Home Care/ [] Refused Recommended Disposition /[] [facility]/ []  Follow-up with PCP Additional Notes: Patient reports she was recently diagnosed with pneumonia and was placed on an antibiotic and given Tessalon Pearls. Patient states that the Kimberlee Nearing is not helping her cough and would like to make an appointment to get cough syrup if she can. Appointment made for today to discuss cough medication.     Reason for Disposition  [1] Completed antibiotic treatment AND [2] cough not improved  Answer Assessment - Initial Assessment Questions 1. SYMPTOM: "What's the main symptom you're concerned about?" (e.g., breathing difficulty, fever, weakness)     Cough 3. BETTER-SAME-WORSE: "Are you getting better, staying the same, or getting worse compared to the day you were discharged?"     Same 4. BREATHING DIFFICULTY: "Are you having any difficulty breathing?" If Yes, ask: "How bad is it?"  (e.g., none, mild, moderate, severe)   - MILD: No SOB at rest, mild SOB with walking, speaks normally in sentences, can lie down, no retractions, pulse < 100.    - MODERATE: SOB at rest, SOB with minimal exertion and prefers to sit, cannot lie down flat, speaks in phrases, mild retractions, audible wheezing, pulse 100-120.    - SEVERE: Very SOB at rest, speaks in single words, struggling to breathe, sitting hunched forward, retractions, pulse > 120      Mild  5. FEVER: "Do you have a fever?" If Yes, ask: "What  is your temperature, how was it measured, and when did it start?"     Has not taken it, woke up in sweat  6. SPUTUM: "Describe the color of your sputum" (clear, white, yellow, green, blood-tinged)     No 7. DIAGNOSIS CONFIRMATION: "When was the pneumonia diagnosed?" "By whom?"     Dr. Almeta Monas 8. ANTIBIOTIC: "Are you taking an antibiotic?"  If Yes, ask: "Which one?" "When was it started?"     Yes 9. OTHER TREATMENT: "Are you receiving any other treatment for the pneumonia?" (e.g., albuterol nebulizer, oxygen) If Yes, ask: "How often?" and "Does it help?"     Tessalon Pearls 10. HOSPITAL ADMISSION: "Were you hospitalized for this pneumonia?" If Yes, ask: "When were you discharged home from the hospital?"       No 11. O2 SATURATION MONITOR:  "Do you use an oxygen saturation monitor (pulse oximeter) at home?" If Yes, "What is your reading (oxygen level) today?" "What is your usual oxygen saturation reading?" (e.g., 95%)       No  Protocols used: Pneumonia Follow-up Call-A-AH

## 2023-05-25 NOTE — Progress Notes (Unsigned)
Established patient visit   Patient: Alexandria [name]   DOB: 03-Dec-1945   78 y.o. Female  MRN: [id number] Visit Date: 05/25/2023  Today's healthcare provider: Alfredia Ferguson, PA-C   Chief Complaint  Patient presents with   Pneumonia    Pearls did not work for her and is needing something for cough-    Subjective     Pt was seen 05/20/23 for pneumonia, prescribed tessalon,zpack, and augmentin.   Pneumonia was found on a CT abd/pelvis.   Pt reports overall she is unchanged, still coughing, having chest discomfort, fatigue. She has finished the zpack and has a few days left of the augmentin.  Medications: Outpatient Medications Prior to Visit  Medication Sig   amoxicillin-clavulanate (AUGMENTIN) 875-125 MG tablet Take 1 tablet by mouth 2 (two) times daily.   aspirin EC 81 MG tablet Take 81 mg by mouth 3 (three) times a week. Swallow whole.   benzonatate (TESSALON) 200 MG capsule Take 1 capsule (200 mg total) by mouth 3 (three) times daily as needed for cough.   diphenoxylate-atropine (LOMOTIL) 2.5-0.025 MG tablet Take 1 tablet by mouth daily.   ferrous sulfate 325 (65 FE) MG tablet Take 1 tablet (325 mg total) by mouth 2 (two) times daily with a meal.   furosemide (LASIX) 20 MG tablet Take 1 tablet (20 mg total) by mouth daily. (Patient taking differently: Take 20 mg by mouth daily as needed for edema or fluid.)   levothyroxine (SYNTHROID) 125 MCG tablet Take 1 tablet (125 mcg total) by mouth daily.   losartan (COZAAR) 50 MG tablet Take 1 tablet (50 mg total) by mouth daily.   Multiple Vitamin (MULTIVITAMIN WITH MINERALS) TABS tablet Take 1 tablet by mouth once a week.   OFEV 100 MG CAPS Take 100 mg by mouth 2 (two) times daily.   ondansetron (ZOFRAN-ODT) 4 MG disintegrating tablet TAKE 1 TABLET BY MOUTH EVERY 8 HOURS AS NEEDED FOR NAUSEA AND VOMITING   vitamin C (ASCORBIC ACID) 500 MG tablet Take 500 mg by mouth 2 (two) times a week.   [DISCONTINUED] azithromycin  (ZITHROMAX Z-PAK) 250 MG tablet As directed   [DISCONTINUED] chlorpheniramine-HYDROcodone (TUSSIONEX) 10-8 MG/5ML Take 5 mLs by mouth every 12 (twelve) hours as needed for cough.   [DISCONTINUED] fluticasone (FLONASE) 50 MCG/ACT nasal spray Place 2 sprays into both nostrils daily.   [DISCONTINUED] potassium chloride SA (KLOR-CON M) 20 MEQ tablet Take 20 mEq by mouth 3 (three) times a week.   No facility-administered medications prior to visit.    Review of Systems  Constitutional:  Positive for fatigue. Negative for fever.  Respiratory:  Positive for cough, shortness of breath and wheezing.   Cardiovascular:  Negative for chest pain and leg swelling.  Gastrointestinal:  Negative for abdominal pain.  Neurological:  Negative for dizziness and headaches.       Objective    BP (!) 166/81   Pulse 65   Temp 97.7 F (36.5 C) (Oral)   Ht 5\' 4"  (1.626 m)   Wt 140 lb 6 oz (63.7 kg)   SpO2 97%   BMI 24.10 kg/m    Physical Exam Vitals reviewed.  Constitutional:      Appearance: She is not ill-appearing.  HENT:     Head: Normocephalic.  Eyes:     Conjunctiva/sclera: Conjunctivae normal.  Cardiovascular:     Rate and Rhythm: Normal rate.  Pulmonary:     Effort: Pulmonary effort is normal. No respiratory distress.  Breath sounds: Examination of the right-lower field reveals rhonchi. Rhonchi present.  Neurological:     General: No focal deficit present.     Mental Status: She is alert and oriented to person, place, and time.  Psychiatric:        Mood and Affect: Mood normal.        Behavior: Behavior normal.     No results found for any visits on 05/25/23.  Assessment & Plan    Pneumonia of right lower lobe due to infectious organism -     DG Chest 2 View; Future -     Promethazine-DM; Take 5 mLs by mouth 4 (four) times daily as needed.  Dispense: 118 mL; Refill: 0   Given lack of improvement, repeat chest xray today. Refilled cough syrup. Advised pt to finish out abx,  rest, hydrate. Return if symptoms worsen or fail to improve.       Alfredia Ferguson, PA-C  [facility] [phone] (phone) [phone] (fax)  [facility]

## 2023-05-26 ENCOUNTER — Encounter: Payer: Self-pay | Admitting: Physician Assistant

## 2023-05-27 ENCOUNTER — Ambulatory Visit: Payer: Medicare Other | Admitting: Family Medicine

## 2023-05-31 DIAGNOSIS — R198 Other specified symptoms and signs involving the digestive system and abdomen: Secondary | ICD-10-CM | POA: Diagnosis not present

## 2023-05-31 DIAGNOSIS — R053 Chronic cough: Secondary | ICD-10-CM | POA: Diagnosis not present

## 2023-05-31 DIAGNOSIS — Z8719 Personal history of other diseases of the digestive system: Secondary | ICD-10-CM | POA: Diagnosis not present

## 2023-05-31 DIAGNOSIS — Z9049 Acquired absence of other specified parts of digestive tract: Secondary | ICD-10-CM | POA: Diagnosis not present

## 2023-05-31 DIAGNOSIS — R197 Diarrhea, unspecified: Secondary | ICD-10-CM | POA: Diagnosis not present

## 2023-05-31 DIAGNOSIS — Z9889 Other specified postprocedural states: Secondary | ICD-10-CM | POA: Diagnosis not present

## 2023-06-08 ENCOUNTER — Ambulatory Visit: Admitting: Family Medicine

## 2023-06-08 ENCOUNTER — Telehealth: Payer: Self-pay | Admitting: Family Medicine

## 2023-06-08 ENCOUNTER — Encounter: Payer: Self-pay | Admitting: Family Medicine

## 2023-06-08 DIAGNOSIS — J189 Pneumonia, unspecified organism: Secondary | ICD-10-CM

## 2023-06-08 MED ORDER — PROMETHAZINE-DM 6.25-15 MG/5ML PO SYRP
5.0000 mL | ORAL_SOLUTION | Freq: Four times a day (QID) | ORAL | 0 refills | Status: DC | PRN
Start: 2023-06-08 — End: 2023-07-06

## 2023-06-08 NOTE — Progress Notes (Unsigned)
 Established Patient Office Visit  Subjective   Patient ID: Alexandria [name], female    DOB: Dec 31, 1945  Age: 78 y.o. MRN: [id number]  Chief Complaint  Patient presents with   Pneumonia   Follow-up    HPI Discussed the use of AI scribe software for clinical note transcription with the patient, who gave verbal consent to proceed.  History of Present Illness   Alexandria [name] "Alexandria [name]" is a 78 year old female with idiopathic pulmonary fibrosis who presents for a follow-up visit due to left leg swelling and persistent cough.  She has been experiencing swelling in her left leg. She is currently taking furosemide, though she is unsure of the exact dosage, possibly 20 mg, and has been advised to increase it to 40 mg for a few days. She is also taking potassium supplements. The swelling is not associated with pain or changes in skin color.  She has a persistent cough and has been in contact with her IPF doctor, who is uncertain about prescribing medication. She is currently taking Mucinex twice a day. The cough is bothersome.  She has a history of idiopathic pulmonary fibrosis and has been experiencing breathing difficulties. Her oxygen levels are at 98%, which she considers good. She is looking for a new IPF doctor due to dissatisfaction with her current care.  She has previously been treated for pneumonia, which she believes may be affecting her IPF. Her oxygen levels are stable. No new or worsening shortness of breath or chest pain.      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   Irritable bowel syndrome without diarrhea 11/19/2022    Ileostomy care (HCC) 11/18/2022   Diaper candidiasis 09/28/2022   Ileostomy, has currently (HCC) 09/28/2022   At risk for dehydration 09/16/2022   At high risk for dehydration 09/14/2022   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   Dehydration 08/26/2022   Irritant contact dermatitis associated with digestive stoma 08/26/2022   Ileostomy in place [facility]) 08/26/2022   High output ileostomy (HCC) 08/25/2022   Diastolic dysfunction 08/14/2022   Chronic facial pain 08/14/2022   Dyspepsia 08/14/2022   Blowout of rectal stump (HCC) 08/14/2022   Diverticulitis of colon 08/13/2022   Colostomy care (HCC) 07/31/2022   Bronchospasm 06/16/2022   Temporal arteritis (HCC) 06/16/2022   Chronic obstructive pulmonary disease with (acute) lower respiratory infection (HCC) 06/16/2022   Angina pectoris with documented spasm (HCC) 06/16/2022   Abdominal aortic aneurysm (AAA) without rupture (HCC) 06/16/2022   Right ankle sprain 06/09/2022   Sprain of right ankle 06/01/2022   Dizziness 06/01/2022   Rectal abscess 06/01/2022   Acute right ankle pain 05/19/2022   History of low anterior resection of rectum 05/04/2022   Irritant contact dermatitis associated with fecal stoma 05/02/2022   Slow transit constipation 05/02/2022   Colostomy complication (HCC) 05/02/2022   IDA (iron deficiency anemia) 04/19/2022   Intra-abdominal abscess (HCC) 04/18/2022   Acute blood loss anemia 04/17/2022   Chronic diastolic CHF (congestive heart failure) (HCC) 04/17/2022   Toxic metabolic encephalopathy 04/17/2022   Ileus following gastrointestinal surgery (HCC) 04/17/2022   Pelvic abscess in female 03/30/2022   Flat foot 11/19/2021   S/P total knee  arthroplasty, left 11/13/2021   Ankle impingement syndrome, left 11/13/2021   Rectal bleeding 08/22/2021   Abnormal thyroid blood test 08/22/2021   Abnormal kidney function 08/22/2021   Dumping syndrome 03/13/2020   Arthritis    Bronchial pneumonia    Colon polyp    DVT (deep  venous thrombosis) (HCC)    Heart murmur    Migraines    Pancreatitis    Thyroid disease    Aortic root dilatation (HCC) 11/24/2019   Leukocytosis 11/06/2019   Generalized abdominal pain 11/06/2019   Pre-ulcerative corn or callous 09/07/2019   Nausea 09/07/2019   Pain of left calf 05/09/2019   Tibial pain 05/09/2019   Essential hypertension 02/08/2019   Healthcare maintenance 11/10/2018   Bradycardia 07/22/2018   Pansinusitis 06/23/2018   History of transient ischemic attack (TIA) 03/24/2018   IPF (idiopathic pulmonary fibrosis) (HCC) 03/24/2018   Chronic rhinitis 02/16/2018   Eustachian tube dysfunction, bilateral 02/16/2018   URI (upper respiratory infection) 02/16/2018   Seasonal allergic rhinitis due to pollen 02/16/2018   ILD (interstitial lung disease) (HCC) 01/07/2018   Dyspnea 01/07/2018   Acute respiratory failure (HCC) 01/06/2018   TIA (transient ischemic attack) 01/06/2018   Hypokalemia 12/15/2017   Chronic cough 11/26/2017   Basal cell carcinoma (BCC) of neck 06/07/2017   Anisometropia 06/02/2017   Drusen of macula of both eyes 06/02/2017   Photopsia of left eye 06/02/2017   Cardiac murmur 05/07/2017   Situational anxiety 05/07/2017   Headache 05/07/2017   Mitral and aortic insufficiency 05/07/2017   Migraine without status migrainosus, not intractable 06/09/2016   Insomnia 11/18/2015   Normal coronary arteries 05/20/2015   RBBB 05/20/2015   Abnormal CT of the chest 05/20/2015   Hx of myocardial infarction 05/08/2015   Bruising 05/08/2015   Upper airway cough syndrome 02/14/2015   Asthmatic bronchitis with acute exacerbation 02/06/2015   Mild intermittent asthma without complication 02/06/2015   Oliguria 12/28/2014   Hypothyroidism 10/25/2014   Fatigue 10/25/2014   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   Chronic venous insufficiency 10/24/2014   Varicose veins of leg with complications 10/24/2014   Degeneration of intervertebral disc of cervical region  06/29/2014   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   Generalized osteoarthritis of multiple sites 06/29/2014   History of stroke without residual deficits 06/29/2014   Palpitations 11/08/2013   Edema of lower extremity 10/03/2013   Hair loss 11/25/2012   Alopecia 11/25/2012   Diverticulitis of large intestine with perforation and abscess 07/14/2012   Hammer toe 06/13/2012   History of stress test 05/21/2011   Hx of echocardiogram 01/27/2010   H/O cardiac catheterization 2004   Past Medical History:  Diagnosis Date   Arthritis    Bronchial pneumonia    Chronic facial pain 08/14/2022   Colon polyp    Diverticulitis    DVT (deep venous thrombosis) (HCC)    lower extremity   H/O cardiac catheterization 2004   Normal coronary arteries   Heart murmur    History of stress test 05/21/2011   Hx of echocardiogram 01/27/2010   Normal Ef 55% the transmitral spectral doppler flow pattern is normal for age. the left ventricular wall motion is normal   Hypothyroidism    IPF (idiopathic pulmonary fibrosis) (HCC) 01/2018   Myocardial infarction (HCC)    hx of 30 years ago   Pancreatitis    TIA (transient ischemic attack)    8 years ago   Past Surgical History:  Procedure Laterality Date   BREAST BIOPSY Left  [facility]   CARDIAC CATHETERIZATION     10/2013   CATARACT EXTRACTION Bilateral 03/22/2018   FLEXIBLE SIGMOIDOSCOPY N/A 08/13/2022   Procedure: FLEXIBLE SIGMOIDOSCOPY;  Surgeon: Karie Soda, MD;  Location: [facility];  Service: General;  Laterality: N/A;   ILEOSTOMY CLOSURE N/A 11/19/2022   Procedure: OPEN TAKEDOWN OF LOOP ILEOSTOMY;  Surgeon: Karie Soda, MD;  Location: [facility];  Service: General;  Laterality: N/A;   LEFT HEART CATHETERIZATION WITH CORONARY ANGIOGRAM N/A 10/25/2013   Procedure: LEFT HEART CATHETERIZATION WITH CORONARY ANGIOGRAM;  Surgeon: Lennette Bihari, MD;  Location: [facility] CATH LAB;  Service: Cardiovascular;  Laterality: N/A;   LYSIS OF ADHESION  N/A 08/13/2022   Procedure: LYSIS OF ADHESION, DIVERTING ILEOSTOMY;  Surgeon: Karie Soda, MD;  Location: [facility];  Service: General;  Laterality: N/A;   NECK SURGERY     ACDF x 2      done in [place] more than 25 years, doesn't know levels   TOTAL KNEE ARTHROPLASTY     Bilateral x's 2   VAGINAL HYSTERECTOMY  10/17/1998   [facility]   VIDEO BRONCHOSCOPY Bilateral 01/24/2018   Procedure: VIDEO BRONCHOSCOPY WITH FLUORO;  Surgeon: Lupita Leash, MD;  Location: [facility];  Service: Cardiopulmonary;  Laterality: Bilateral;   XI ROBOTIC ASSISTED COLOSTOMY TAKEDOWN N/A 08/13/2022   Procedure: ROBOTIC OSTOMY TAKEDOWN, SMALL BOWEL RESECTION X 2, BILATERAL TAP BLOCK, TISSUE PERFUSSION ASSESMENT VIA FIREFLY, MOBILAZATION OF SPLENIC FLEXURE;  Surgeon: Karie Soda, MD;  Location: [facility];  Service: General;  Laterality: N/A;   XI ROBOTIC ASSISTED LOWER ANTERIOR RESECTION N/A 03/31/2022   Procedure: XI ROBOTIC ASSISTED LOWER ANTERIOR RESECTION;  Surgeon: Karie Soda, MD;  Location: [facility];  Service: General;  Laterality: N/A;   Social History   Tobacco Use   Smoking status: Never   Smokeless tobacco: Never  Vaping Use   Vaping status: Never Used  Substance Use Topics   Alcohol use: No    Alcohol/week: 0.0 standard drinks of alcohol   Drug use: No   Social History   Socioeconomic History   Marital status: Married    Spouse name: Tasia Catchings   Number of children: 3   Years of education: Jr college   Highest education level: Not on file  Occupational History   Occupation: Leisure centre manager: UNEMPLOYED   Occupation: retired  Tobacco Use   Smoking status: Never   Smokeless tobacco: Never  Vaping Use   Vaping status: Never Used  Substance and Sexual Activity   Alcohol use: No    Alcohol/week: 0.0 standard drinks of alcohol   Drug use: No   Sexual activity: Not Currently  Other Topics Concern   Not on file  Social History Narrative   Lives with husband Tasia Catchings   Caffeine use:  coffee (2 cups per day)   Mostly right-handed   Social Drivers of Corporate investment banker Strain: Not on file  Food Insecurity: Low Risk  (04/26/2023)   Received from [facility]   Hunger Vital Sign    Worried About Running Out of Food in the Last Year: Never true    Ran Out of Food in the Last Year: Never true  Transportation Needs: No Transportation Needs (04/26/2023)   Received from [facility]    In the past 12 months, has lack of reliable transportation kept you from medical appointments, meetings, work or from getting things needed for daily living? : No  Physical Activity: Not on file  Stress: Not on file  Social Connections: Not on file  Intimate Partner Violence: Patient Declined (11/20/2022)   Humiliation, Afraid, Rape, and Kick questionnaire    Fear of Current or Ex-Partner: Patient declined    Emotionally Abused: Patient declined    Physically Abused: Patient declined    Sexually Abused: Patient declined   Family Status  Relation Name Status   Mother  Deceased   Father  Deceased   Brother  (Not Specified)   Brother  (Not Specified)   Brother  (Not Specified)   Brother  (Not Specified)   Brother  (Not Specified)   Mat Aunt  (Not Specified)   MGM  (Not Specified)   Other  (Not Specified)   Neg Hx  (Not Specified)  No partnership data on file   Family History  Problem Relation Age of Onset   Ovarian cancer Mother    Uterine cancer Mother    Lung cancer Father    HIV Brother        1982   Kidney cancer Brother    Lung cancer Brother    Other Brother        Mouth Cancer   Colon cancer Maternal Aunt    Heart disease Maternal Grandmother    Stroke Maternal Grandmother    Hypertension Maternal Grandmother    Diabetes Maternal Grandmother    Arthritis Other    Esophageal cancer Neg Hx    Liver disease Neg Hx    Rectal cancer Neg Hx    Stomach cancer Neg Hx    Allergies  Allergen Reactions   Prednisone Other (See Comments)     Changed personality    Dilaudid [Hydromorphone] Itching and Other (See Comments)    Dilaudid caused marked confusion   Atrovent Hfa [Ipratropium Bromide Hfa] Itching and Other (See Comments)    Pt could not sleep, Insomnia    Cheratussin Ac [Guaifenesin-Codeine] Other (See Comments)    Headaches    Diltiazem Itching and Other (See Comments)    Makes patient sick. Shakes. GI   Hydrocodone Itching   Influenza Vaccines Other (See Comments)    Pt reports heart attack after last flu shot   Morphine Itching   Motrin [Ibuprofen] Itching and Other (See Comments)    "Gives false reading in blood"   Oxycodone Itching   Codeine Itching, Nausea Only and Other (See Comments)    Pt takes promethazine with codeine at home      ROS    Objective:     BP (!) 158/80 (BP Location: Right Arm, Patient Position: Sitting, Cuff Size: Normal)   Pulse 80   Temp 98 F (36.7 C) (Oral)   Resp 18   Ht 5\' 4"  (1.626 m)   Wt 135 lb 9.6 oz (61.5 kg)   SpO2 98%   BMI 23.28 kg/m  BP Readings from Last 3 Encounters:  06/08/23 (!) 158/80  05/25/23 (!) 166/81  05/20/23 (!) 140/90   Wt Readings from Last 3 Encounters:  06/08/23 135 lb 9.6 oz (61.5 kg)  05/25/23 140 lb 6 oz (63.7 kg)  05/20/23 137 lb 3.2 oz (62.2 kg)   SpO2 Readings from Last 3 Encounters:  06/08/23 98%  05/25/23 97%  05/20/23 97%      Physical Exam   No results found for any visits on 06/08/23.  Last CBC Lab Results  Component Value Date   WBC 5.9 01/06/2023   HGB 12.4 01/06/2023   HCT 37.9 01/06/2023   MCV 97.8 01/06/2023  MCH 31.3 12/04/2022   RDW 14.9 01/06/2023   PLT 246.0 01/06/2023   Last metabolic panel Lab Results  Component Value Date   GLUCOSE 89 04/23/2023   NA 138 04/23/2023   K 4.0 04/23/2023   CL 102 04/23/2023   CO2 28 04/23/2023   BUN 19 04/23/2023   CREATININE 0.80 04/23/2023   GFR 64.03 11/26/2022   CALCIUM 9.3 04/23/2023   PHOS 2.4 (L) 04/21/2022   PROT 7.0 04/23/2023   ALBUMIN 3.7  11/26/2022   BILITOT 0.6 04/23/2023   ALKPHOS 58 11/26/2022   AST 18 04/23/2023   ALT 10 04/23/2023   ANIONGAP 11 11/20/2022   Last lipids Lab Results  Component Value Date   CHOL 177 12/04/2022   HDL 44 (L) 12/04/2022   LDLCALC 113 (H) 12/04/2022   TRIG 95 12/04/2022   CHOLHDL 4.0 12/04/2022   Last hemoglobin A1c Lab Results  Component Value Date   HGBA1C 5.4 04/05/2022   Last thyroid functions Lab Results  Component Value Date   TSH 54.64 (H) 12/04/2022   T4TOTAL 11.0 08/22/2021   Last vitamin D Lab Results  Component Value Date   VD25OH 29.9 (L) 05/09/2019   Last vitamin B12 and Folate Lab Results  Component Value Date   VITAMINB12 640 04/18/2022   FOLATE 10.8 04/18/2022      The ASCVD Risk score (Arnett DK, et al., 2019) failed to calculate for the following reasons:   Risk score cannot be calculated because patient has a medical history suggesting prior/existing ASCVD    Assessment & Plan:   Problem List Items Addressed This Visit       Unprioritized   Pneumonia of right lower lobe due to infectious organism   Cxr done---  negative for pneumonia       Relevant Medications   promethazine-dextromethorphan (PROMETHAZINE-DM) 6.25-15 MG/5ML syrup  Assessment and Plan    Left Leg Swelling   Presents with left leg swelling and is currently taking furosemide 20 mg. There is no significant pain, discoloration, shortness of breath, or chest pain, and oxygen saturation is at 98%. Increase furosemide to 40 mg daily for 3-4 days to manage swelling, then reassess and reduce back to 20 mg if swelling improves.  Idiopathic Pulmonary Fibrosis (IPF)   IPF with a persistent cough, recently treated for pneumonia. Oxygen saturation is 98%. She is dissatisfied with her current IPF specialist and is considering re-establishing care with her previous specialist at [facility]. Discussed using over-the-counter cough suppressants as an alternative to prescription medication. Contact  previous IPF specialist [facility], to re-establish care. Consider over-the-counter cough suppressants such as Robitussin DM, Delsym, or Mucinex DM.  Follow-up   Return for a three-month checkup for blood work next month.        No follow-ups on file.    Donato Schultz, DO

## 2023-06-08 NOTE — Telephone Encounter (Signed)
 Copied from CRM [id number]. Topic: Clinical - Medication Question >> Jun 08, 2023  5:52 PM Alexandria [name] wrote: Reason for CRM: Patient was just seen in clinic today and forgot to ask the doctor for an antibiotic for her pneumonia. Patient is requesting antibiotic to be sent to [facility] on  [address].

## 2023-06-09 ENCOUNTER — Telehealth: Payer: Self-pay | Admitting: Cardiovascular Disease

## 2023-06-09 NOTE — Telephone Encounter (Signed)
 Spoke with pt. She saw PCP yesterday who doubled her lasix for the next 2 days due to leg edema. She wanted to have a appointment as a backup in case the swelling does not go down. She will start the 40mg  today. She stated the swelling was mostly in her left leg but there is no pain, not hot to touch etc. She denies pain anywhere else but is having coming and going SOB. Reviewed limiting salt, sitting with legs up, compression stockings and 911/ED precautions. Appointment set for March 26th. Pt stated understanding Will forward to provider in case of other recommendations.

## 2023-06-09 NOTE — Telephone Encounter (Signed)
 Pt c/o swelling/edema: STAT if pt has developed SOB within 24 hours  If swelling, where is the swelling located?   Left leg   How much weight have you gained and in what time span?   No  Have you gained 2 pounds in a day or 5 pounds in a week?   No  Do you have a log of your daily weights (if so, list)?   No  Are you currently taking a fluid pill?   Yes  Are you currently SOB?   Patient stated sometimes she gets short of breath  Have you traveled recently in a car or plane for an extended period of time?   No  Patient is concerned she has been having swelling in her left leg with sometimes SOB.

## 2023-06-10 DIAGNOSIS — J069 Acute upper respiratory infection, unspecified: Secondary | ICD-10-CM | POA: Diagnosis not present

## 2023-06-10 DIAGNOSIS — Z20822 Contact with and (suspected) exposure to covid-19: Secondary | ICD-10-CM | POA: Diagnosis not present

## 2023-06-10 DIAGNOSIS — J189 Pneumonia, unspecified organism: Secondary | ICD-10-CM | POA: Insufficient documentation

## 2023-06-10 DIAGNOSIS — R059 Cough, unspecified: Secondary | ICD-10-CM | POA: Diagnosis not present

## 2023-06-10 NOTE — Assessment & Plan Note (Signed)
 Cxr done---  negative for pneumonia

## 2023-06-15 ENCOUNTER — Ambulatory Visit: Payer: Medicare Other | Admitting: Dermatology

## 2023-06-17 ENCOUNTER — Other Ambulatory Visit: Payer: Self-pay | Admitting: Family Medicine

## 2023-06-17 DIAGNOSIS — R053 Chronic cough: Secondary | ICD-10-CM

## 2023-06-17 NOTE — Telephone Encounter (Unsigned)
 Copied from CRM [id number]. Topic: Clinical - Medication Refill >> Jun 17, 2023 12:01 PM Isabell A wrote: Most Recent Primary Care Visit:  Provider: Seabron Spates R  Department: [facility]  Visit Type: OFFICE VISIT  Date: 06/08/2023  Medication: benzonatate (TESSALON) 200 MG capsule  Has the patient contacted their pharmacy? Yes, pharmacy told pt to contact PCP.  (Agent: If no, request that the patient contact the pharmacy for the refill. If patient does not wish to contact the pharmacy document the reason why and proceed with request.) (Agent: If yes, when and what did the pharmacy advise?)  Is this the correct pharmacy for this prescription? Yes If no, delete pharmacy and type the correct one.  This is the patient's preferred pharmacy:  [id number] - [place], [place] - [address] [place] [address] Phone: [phone] Fax: [phone]   Has the prescription been filled recently? Yes  Is the patient out of the medication? Yes  Has the patient been seen for an appointment in the last year OR does the patient have an upcoming appointment? Yes  Can we respond through MyChart? Yes  Agent: Please be advised that Rx refills may take up to 3 business days. We ask that you follow-up with your pharmacy.

## 2023-06-17 NOTE — Telephone Encounter (Signed)
 Last Fill: 05/21/23  Last OV: 06/08/23 Next OV: None Scheduled  Routing to provider for review/authorization.

## 2023-06-18 MED ORDER — BENZONATATE 200 MG PO CAPS: 200.0000 mg | ORAL_CAPSULE | Freq: Three times a day (TID) | ORAL | 3 refills | Status: DC | PRN

## 2023-06-23 ENCOUNTER — Encounter: Payer: Self-pay | Admitting: Physician Assistant

## 2023-06-23 ENCOUNTER — Ambulatory Visit: Attending: Physician Assistant | Admitting: Physician Assistant

## 2023-06-23 ENCOUNTER — Ambulatory Visit (INDEPENDENT_AMBULATORY_CARE_PROVIDER_SITE_OTHER)

## 2023-06-23 VITALS — BP 140/70 | HR 80 | Ht 64.0 in | Wt 138.0 lb

## 2023-06-23 DIAGNOSIS — I352 Nonrheumatic aortic (valve) stenosis with insufficiency: Secondary | ICD-10-CM

## 2023-06-23 DIAGNOSIS — R0609 Other forms of dyspnea: Secondary | ICD-10-CM

## 2023-06-23 DIAGNOSIS — R000 Tachycardia, unspecified: Secondary | ICD-10-CM

## 2023-06-23 DIAGNOSIS — I5032 Chronic diastolic (congestive) heart failure: Secondary | ICD-10-CM | POA: Diagnosis not present

## 2023-06-23 DIAGNOSIS — I10 Essential (primary) hypertension: Secondary | ICD-10-CM

## 2023-06-23 DIAGNOSIS — R002 Palpitations: Secondary | ICD-10-CM

## 2023-06-23 DIAGNOSIS — J849 Interstitial pulmonary disease, unspecified: Secondary | ICD-10-CM

## 2023-06-23 DIAGNOSIS — I4510 Unspecified right bundle-branch block: Secondary | ICD-10-CM | POA: Diagnosis not present

## 2023-06-23 MED ORDER — LOSARTAN POTASSIUM 25 MG PO TABS: 25.0000 mg | ORAL_TABLET | Freq: Every day | ORAL | Status: DC

## 2023-06-23 MED ORDER — MAGNESIUM OXIDE 400 MG PO TABS: 400.0000 mg | ORAL_TABLET | Freq: Every day | ORAL | 3 refills | Status: DC

## 2023-06-23 NOTE — Patient Instructions (Addendum)
 Medication Instructions:  DECREASE LOSARTAN TO 25 MG DAILY  START MAGNESIUM OXIDE 400 MG  DAILY *If you need a refill on your cardiac medications before your next appointment, please call your pharmacy*   Lab Work: [facility] AND MAGNESIUM TODAY If you have labs (blood work) drawn today and your tests are completely normal, you will receive your results only by: MyChart Message (if you have MyChart) OR A paper copy in the mail If you have any lab test that is abnormal or we need to change your treatment, we will call you to review the results.   Testing/Procedures: Christena Deem- Long Term Monitor Instructions  Your physician has requested you wear a ZIO patch monitor for 14 days.  This is a single patch monitor. [employer] supplies one patch monitor per enrollment. Additional stickers are not available. Please do not apply patch if you will be having a Nuclear Stress Test,  Echocardiogram, Cardiac CT, MRI, or Chest Xray during the period you would be wearing the  monitor. The patch cannot be worn during these tests. You cannot remove and re-apply the  ZIO XT patch monitor.  Your ZIO patch monitor will be mailed 3 day USPS to your address on file. It may take 3-5 days  to receive your monitor after you have been enrolled.  Once you have received your monitor, please review the enclosed instructions. Your monitor  has already been registered assigning a specific monitor serial # to you.  Billing and Patient Assistance Program Information  We have supplied [employer] with any of your insurance information on file for billing purposes. [employer] offers a sliding scale Patient Assistance Program for patients that do not have  insurance, or whose insurance does not completely cover the cost of the ZIO monitor.  You must apply for the Patient Assistance Program to qualify for this discounted rate.  To apply, please call [employer] at [phone], select option 4, select option 2, ask to apply for   Patient Assistance Program. Meredeth Ide will ask your household income, and how many people  are in your household. They will quote your out-of-pocket cost based on that information.  [employer] will also be able to set up a 58-month, interest-free payment plan if needed.  Applying the monitor   Shave hair from upper left chest.  Hold abrader disc by orange tab. Rub abrader in 40 strokes over the upper left chest as  indicated in your monitor instructions.  Clean area with 4 enclosed alcohol pads. Let dry.  Apply patch as indicated in monitor instructions. Patch will be placed under collarbone on left  side of chest with arrow pointing upward.  Rub patch adhesive wings for 2 minutes. Remove white label marked "1". Remove the white  label marked "2". Rub patch adhesive wings for 2 additional minutes.  While looking in a mirror, press and release button in center of patch. A small green light will  flash 3-4 times. This will be your only indicator that the monitor has been turned on.  Do not shower for the first 24 hours. You may shower after the first 24 hours.  Press the button if you feel a symptom. You will hear a small click. Record Date, Time and  Symptom in the Patient Logbook.  When you are ready to remove the patch, follow instructions on the last 2 pages of Patient  Logbook. Stick patch monitor onto the last page of Patient Logbook.  Place Patient Logbook in the blue and white box. Use locking  tab on box and tape box closed  securely. The blue and white box has prepaid postage on it. Please place it in the mailbox as  soon as possible. Your physician should have your test results approximately 7 days after the  monitor has been mailed back to [facility].  Call [facility] Customer Care at [phone] if you have questions regarding  your ZIO XT patch monitor. Call them immediately if you see an orange light blinking on your  monitor.  If your monitor falls off in less than 4  days, contact our Monitor department at [phone].  If your monitor becomes loose or falls off after 4 days call [employer] at [phone] for  suggestions on securing your monitor    Follow-Up: At [facility], you and your health needs are our priority.  As part of our continuing mission to provide you with exceptional heart care, we have created designated Provider Care Teams.  These Care Teams include your primary Cardiologist (physician) and Advanced Practice Providers (APPs -  Physician Assistants and Nurse Practitioners) who all work together to provide you with the care you need, when you need it.   Your next appointment:   6 week(s)  Provider:   Thurmon Fair, MD or Micah Flesher, [place]   Other Instructions

## 2023-06-23 NOTE — Progress Notes (Signed)
 Cardiology Office Note:    Date:  06/23/2023   ID:  Alexandria [name], DOB 28-May-1945, MRN [id number]  PCP:  Zola Button, Grayling Congress, DO   [facility] Providers Cardiologist:  Thomasene Ripple, DO     Referring MD: Zola Button, Grayling Congress, *   Chief Complaint  Patient presents with   Follow-up    palpitations    History of Present Illness:    Alexandria [name] is a 78 y.o. female with a hx of ILD, chronic venous insufficiency, asthma, hypothyroidism, HFpEF, severe diverticulitis s/p partial colectomy and colostomy.  Heart catheterization 2015 showed no coronary artery disease.  Coronary CTA 10/2019 showed coronary calcium score of 0, no evidence of CAD, dilated proximal ascending aorta measuring 40 mm.  Heart monitor 2021 with episodes of brief paroxysmal atrial tachycardia but no significant bradycardia or advanced AV block.  She was referred to cardiology for evaluation of incidental bradycardia.  This was in the setting of prolonged recovery from abdominal infection and surgery as above.  Echocardiogram 07/2022 showed a preserved LVEF, aortic valve sclerosis without stenosis, mild to moderate AI, and ascending aorta measured 41 mm.  She has recently struggled with orthostatic hypotension related to hypovolemia due to frequent watery diarrhea.  We are avoiding tight blood pressure control and have been tolerating systolic blood pressures in the 150s.  In addition, Dr. Royann Shivers has been avoiding medications with negative chronotropic effect such as beta-blocker and calcium channel blockers.  She was last seen in clinic 02/10/2023 for evaluation of dyspnea on exertion.  She had unilateral lower extremity swelling.  US Doppler ruled out DVT 02/2023.  She presents today for cardiology follow up and dyspnea on exertion.  She states her heart is not doing what its supposed to do. With further questioning she seems to describe palpitations. She can't correlate this to any activity.  Palpitations occur once per day and last several minutes. No syncope. No other associated symptoms.    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 the left ventricular wall motion is normal   Hypothyroidism    IPF (idiopathic pulmonary fibrosis) (HCC) 01/2018   Myocardial infarction (HCC)    hx of 30 years ago   Pancreatitis    TIA (transient ischemic attack)    8 years ago    Past Surgical History:  Procedure Laterality Date   BREAST BIOPSY Left    [place]   CARDIAC CATHETERIZATION     10/2013   CATARACT EXTRACTION Bilateral 03/22/2018   FLEXIBLE SIGMOIDOSCOPY N/A 08/13/2022   Procedure: FLEXIBLE SIGMOIDOSCOPY;  Surgeon: Karie Soda, MD;  Location: [facility];  Service: General;  Laterality: N/A;   ILEOSTOMY CLOSURE N/A 11/19/2022   Procedure: OPEN TAKEDOWN OF LOOP ILEOSTOMY;  Surgeon: Karie Soda, MD;  Location: [facility];  Service: General;  Laterality: N/A;   LEFT HEART CATHETERIZATION WITH CORONARY ANGIOGRAM N/A 10/25/2013   Procedure: LEFT HEART CATHETERIZATION WITH CORONARY ANGIOGRAM;  Surgeon: Lennette Bihari, MD;  Location: [facility] CATH LAB;  Service: Cardiovascular;  Laterality: N/A;  LYSIS OF ADHESION N/A 08/13/2022   Procedure: LYSIS OF ADHESION, DIVERTING ILEOSTOMY;  Surgeon: Karie Soda, MD;  Location: [facility];  Service: General;  Laterality: N/A;   NECK SURGERY     ACDF x 2      done in [place] more than 25 years, doesn't know levels   TOTAL KNEE ARTHROPLASTY     Bilateral x's 2   VAGINAL HYSTERECTOMY  10/17/1998   Konrad Dolores   VIDEO BRONCHOSCOPY Bilateral 01/24/2018   Procedure: VIDEO  BRONCHOSCOPY WITH FLUORO;  Surgeon: Lupita Leash, MD;  Location[facility]COPY;  Service: Cardiopulmonary;  Laterality: Bilateral;   XI ROBOTIC ASSISTED COLOSTOMY TAKEDOWN N/A 08/13/2022   Procedure: ROBOTIC OSTOMY TAKEDOWN, SMALL BOWEL RESECTION X 2, BILATERAL TAP BLOCK, TISSUE PERFUSSION ASSESMENT VIA FIREFLY, MOBILAZATION OF SPLENIC FLEXURE;  Surgeon: Karie Soda, MD;  Location: [facility];  Service: General;  Laterality: N/A;   XI ROBOTIC ASSISTED LOWER ANTERIOR RESECTION N/A 03/31/2022   Procedure: XI ROBOTIC ASSISTED LOWER ANTERIOR RESECTION;  Surgeon: Karie Soda, MD;  Location: [facility];  Service: General;  Laterality: N/A;    Current Medications: Current Meds  Medication Sig   aspirin EC 81 MG tablet Take 81 mg by mouth 3 (three) times a week. Swallow whole.   benzonatate (TESSALON) 200 MG capsule Take 1 capsule (200 mg total) by mouth 3 (three) times daily as needed for cough.   diphenoxylate-atropine (LOMOTIL) 2.5-0.025 MG tablet Take 1 tablet by mouth daily.   ferrous sulfate 325 (65 FE) MG tablet Take 1 tablet (325 mg total) by mouth 2 (two) times daily with a meal.   furosemide (LASIX) 20 MG tablet Take 1 tablet (20 mg total) by mouth daily. (Patient taking differently: Take 20 mg by mouth daily as needed for edema or fluid.)   levothyroxine (SYNTHROID) 125 MCG tablet Take 1 tablet (125 mcg total) by mouth daily.   magnesium oxide (MAG-OX) 400 MG tablet Take 1 tablet (400 mg total) by mouth daily.   Multiple Vitamin (MULTIVITAMIN WITH MINERALS) TABS tablet Take 1 tablet by mouth once a week.   OFEV 100 MG CAPS Take 100 mg by mouth 2 (two) times daily.   ondansetron (ZOFRAN-ODT) 4 MG disintegrating tablet TAKE 1 TABLET BY MOUTH EVERY 8 HOURS AS NEEDED FOR NAUSEA AND VOMITING   promethazine-dextromethorphan (PROMETHAZINE-DM) 6.25-15 MG/5ML syrup Take 5 mLs by mouth 4 (four) times daily as needed.   vitamin C (ASCORBIC ACID) 500 MG tablet Take 500 mg by mouth 2 (two) times a week.    [DISCONTINUED] losartan (COZAAR) 50 MG tablet Take 1 tablet (50 mg total) by mouth daily.     Allergies:   Prednisone, Dilaudid [hydromorphone], Atrovent hfa [ipratropium bromide hfa], Cheratussin ac [guaifenesin-codeine], Diltiazem, Hydrocodone, Influenza vaccines, Morphine, Motrin [ibuprofen], Oxycodone, and Codeine   Social History   Socioeconomic History   Marital status: Married    Spouse name: Tasia Catchings   Number of children: 3   Years of education: Jr college   Highest education level: Not on file  Occupational History   Occupation: Leisure centre manager: UNEMPLOYED   Occupation: retired  Tobacco Use   Smoking status: Never   Smokeless tobacco: Never  Vaping Use   Vaping status: Never Used  Substance and Sexual Activity   Alcohol use: No    Alcohol/week: 0.0 standard drinks of alcohol   Drug use: No   Sexual activity: Not Currently  Other Topics Concern   Not on file  Social History Narrative  Lives with husband Tasia Catchings   Caffeine use: coffee (2 cups per day)   Mostly right-handed   Social Drivers of Corporate investment banker Strain: Not on file  Food Insecurity: Low Risk  (04/26/2023)   Received from [facility]   Hunger Vital Sign    Worried About Running Out of Food in the Last Year: Never true    Ran Out of Food in the Last Year: Never true  Transportation Needs: No Transportation Needs (04/26/2023)   Received from [facility]    In the past 12 months, has lack of reliable transportation kept you from medical appointments, meetings, work or from getting things needed for daily living? : No  Physical Activity: Not on file  Stress: Not on file  Social Connections: Not on file     Family History: The patient's family history includes Arthritis in an other family member; Colon cancer in her maternal aunt; Diabetes in her maternal grandmother; HIV in her brother; Heart disease in her maternal grandmother; Hypertension in her maternal  grandmother; Kidney cancer in her brother; Lung cancer in her brother and father; Other in her brother; Ovarian cancer in her mother; Stroke in her maternal grandmother; Uterine cancer in her mother. There is no history of Esophageal cancer, Liver disease, Rectal cancer, or Stomach cancer.  ROS:   Please see the history of present illness.     All other systems reviewed and are negative.  EKGs/Labs/Other Studies Reviewed:    The following studies were reviewed today:       Recent Labs: 11/20/2022: Magnesium 2.0 12/04/2022: TSH 54.64 01/06/2023: Hemoglobin 12.4; Platelets 246.0 04/23/2023: ALT 10; BUN 19; Creat 0.80; Potassium 4.0; Sodium 138  Recent Lipid Panel    Component Value Date/Time   CHOL 177 12/04/2022 1505   CHOL 183 06/15/2022 0956   TRIG 95 12/04/2022 1505   HDL 44 (L) 12/04/2022 1505   HDL 42 06/15/2022 0956   CHOLHDL 4.0 12/04/2022 1505   VLDL 18.8 11/26/2022 1516   LDLCALC 113 (H) 12/04/2022 1505     Risk Assessment/Calculations:           Physical Exam:    VS:  BP (!) 140/70 (BP Location: Left Arm, Patient Position: Sitting, Cuff Size: Normal)   Pulse 80   Ht 5\' 4"  (1.626 m)   Wt 138 lb (62.6 kg)   BMI 23.69 kg/m     Wt Readings from Last 3 Encounters:  06/23/23 138 lb (62.6 kg)  06/08/23 135 lb 9.6 oz (61.5 kg)  05/25/23 140 lb 6 oz (63.7 kg)     GEN:  Well nourished, well developed in no acute distress HEENT: Normal NECK: No JVD; No carotid bruits LYMPHATICS: No lymphadenopathy CARDIAC: RRR, no murmurs, rubs, gallops RESPIRATORY:  wheezing throughout ABDOMEN: Soft, non-tender, non-distended MUSCULOSKELETAL:  No edema; No deformity  SKIN: Warm and dry NEUROLOGIC:  Alert and oriented x 3 PSYCHIATRIC:  Normal affect   ASSESSMENT:    1. DOE (dyspnea on exertion)   2. Primary hypertension   3. Tachycardia   4. RBBB   5. Palpitations   6. Chronic diastolic CHF (congestive heart failure) (HCC)   7. ILD (interstitial lung disease) (HCC)    8. Nonrheumatic aortic insufficiency with aortic stenosis    PLAN:    In order of problems listed above:   Palpitations - EKG today with regular rhythm, no ectopy - I will check a battery of labs including a TSH which was 54  at last check in August 2024 - Also recommended OTC magnesium - I will place a heart monitor as she states that her current palpitations are different than her prior atrial card tachycardia found on last heart monitor in 2021 - If all unrevealing, we will repeat her echocardiogram early   Dyspnea on exertion - not a current complaint, but wheezing on exam Unilateral lower extremity swelling - Reassuring heart catheterization and more recent coronary CTA -- has been tested for DVT previously, negative  -- wheezing, needs to re-establish with pulmonology, she [facility] [place] She will let me know if she needs a new referral for [facility]   Hypertension She is taking 50 mg of losartan every other day because it makes her feel bad - I discussed how this is difficult for her kidneys and heart - Will reduce 25 mg losartan to be taken at night and she is agreement this -- she did not take losartan today and has elevated BP   Grade 1 diastolic dysfunction - not volume up on exam   Ascending aortic aneurysm - 41 mm on echo September 2024 - Repeat an echocardiogram 12/2023   Aortic insufficiency - Mild to moderate AI on last echocardiogram 2024 - Repeat echocardiogram later this year as above  Follow up in 6 weeks           Medication Adjustments/Labs and Tests Ordered: Current medicines are reviewed at length with the patient today.  Concerns regarding medicines are outlined above.  Orders Placed This Encounter  Procedures   CBC   Basic metabolic panel   TSH   Magnesium   LONG TERM MONITOR (3-14 DAYS)   EKG 12-Lead   Meds ordered this encounter  Medications   losartan (COZAAR) 25 MG  tablet    Sig: Take 1 tablet (25 mg total) by mouth daily.   magnesium oxide (MAG-OX) 400 MG tablet    Sig: Take 1 tablet (400 mg total) by mouth daily.    Dispense:  90 tablet    Refill:  3    Patient Instructions  Medication Instructions:  DECREASE LOSARTAN TO 25 MG DAILY  START MAGNESIUM OXIDE 400 MG  DAILY *If you need a refill on your cardiac medications before your next appointment, please call your pharmacy*   Lab Work: [facility] AND MAGNESIUM TODAY If you have labs (blood work) drawn today and your tests are completely normal, you will receive your results only by: MyChart Message (if you have MyChart) OR A paper copy in the mail If you have any lab test that is abnormal or we need to change your treatment, we will call you to review the results.   Testing/Procedures: Christena Deem- Long Term Monitor Instructions  Your physician has requested you wear a ZIO patch monitor for 14 days.  This is a single patch monitor. [employer] supplies one patch monitor per enrollment. Additional stickers are not available. Please do not apply patch if you will be having a Nuclear Stress Test,  Echocardiogram, Cardiac CT, MRI, or Chest Xray during the period you would be wearing the  monitor. The patch cannot be worn during these tests. You cannot remove and re-apply the  ZIO XT patch monitor.  Your ZIO patch monitor will be mailed 3 day USPS to your address on file. It may take 3-5 days  to receive your monitor after you have been enrolled.  Once you have received your monitor, please review  the enclosed instructions. Your monitor  has already been registered assigning a specific monitor serial # to you.  Billing and Patient Assistance Program Information  We have supplied [employer] with any of your insurance information on file for billing purposes. [employer] offers a sliding scale Patient Assistance Program for patients that do not have  insurance, or whose insurance does not completely cover  the cost of the ZIO monitor.  You must apply for the Patient Assistance Program to qualify for this discounted rate.  To apply, please call [employer] at [phone], select option 4, select option 2, ask to apply for  Patient Assistance Program. Meredeth Ide will ask your household income, and how many people  are in your household. They will quote your out-of-pocket cost based on that information.  [employer] will also be able to set up a 81-month, interest-free payment plan if needed.  Applying the monitor   Shave hair from upper left chest.  Hold abrader disc by orange tab. Rub abrader in 40 strokes over the upper left chest as  indicated in your monitor instructions.  Clean area with 4 enclosed alcohol pads. Let dry.  Apply patch as indicated in monitor instructions. Patch will be placed under collarbone on left  side of chest with arrow pointing upward.  Rub patch adhesive wings for 2 minutes. Remove white label marked "1". Remove the white  label marked "2". Rub patch adhesive wings for 2 additional minutes.  While looking in a mirror, press and release button in center of patch. A small green light will  flash 3-4 times. This will be your only indicator that the monitor has been turned on.  Do not shower for the first 24 hours. You may shower after the first 24 hours.  Press the button if you feel a symptom. You will hear a small click. Record Date, Time and  Symptom in the Patient Logbook.  When you are ready to remove the patch, follow instructions on the last 2 pages of Patient  Logbook. Stick patch monitor onto the last page of Patient Logbook.  Place Patient Logbook in the blue and white box. Use locking tab on box and tape box closed  securely. The blue and white box has prepaid postage on it. Please place it in the mailbox as  soon as possible. Your physician should have your test results approximately 7 days after the  monitor has been mailed back to [facility].  Call [facility] Customer Care at [phone] if you have questions regarding  your ZIO XT patch monitor. Call them immediately if you see an orange light blinking on your  monitor.  If your monitor falls off in less than 4 days, contact our Monitor department at [phone].  If your monitor becomes loose or falls off after 4 days call [employer] at [phone] for  suggestions on securing your monitor    Follow-Up: At [facility], you and your health needs are our priority.  As part of our continuing mission to provide you with exceptional heart care, we have created designated Provider Care Teams.  These Care Teams include your primary Cardiologist (physician) and Advanced Practice Providers (APPs -  Physician Assistants and Nurse Practitioners) who all work together to provide you with the care you need, when you need it.   Your next appointment:   6 week(s)  Provider:   Thurmon Fair, MD or Micah Flesher, [place]   Other Instructions         Signed, Roe Rutherford  Shaguana Love, PA  06/23/2023 5:08 PM    [facility]

## 2023-06-23 NOTE — Progress Notes (Unsigned)
 Enrolled for [employer] to mail a ZIO XT long term holter monitor to the patients address on file.   Dr. Harriet Masson to read.

## 2023-06-24 LAB — MAGNESIUM: Magnesium: 2.2 mg/dL (ref 1.6–2.3)

## 2023-06-24 LAB — BASIC METABOLIC PANEL
BUN/Creatinine Ratio: 16 (ref 12–28)
BUN: 15 mg/dL (ref 8–27)
CO2: 25 mmol/L (ref 20–29)
Calcium: 9.6 mg/dL (ref 8.7–10.3)
Chloride: 102 mmol/L (ref 96–106)
Creatinine, Ser: 0.96 mg/dL (ref 0.57–1.00)
Glucose: 99 mg/dL (ref 70–99)
Potassium: 4.6 mmol/L (ref 3.5–5.2)
Sodium: 142 mmol/L (ref 134–144)
eGFR: 61 mL/min/{1.73_m2} (ref >59)

## 2023-06-24 LAB — TSH: TSH: 0.217 u[IU]/mL — ABNORMAL LOW (ref 0.450–4.500)

## 2023-06-24 LAB — CBC
Hematocrit: 40.2 % (ref 34.0–46.6)
Hemoglobin: 13.6 g/dL (ref 11.1–15.9)
MCH: 31.8 pg (ref 26.6–33.0)
MCHC: 33.8 g/dL (ref 31.5–35.7)
MCV: 94 fL (ref 79–97)
Platelets: 208 10*3/uL (ref 150–450)
RBC: 4.28 x10E6/uL (ref 3.77–5.28)
RDW: 12.5 % (ref 11.7–15.4)
WBC: 5.9 10*3/uL (ref 3.4–10.8)

## 2023-06-25 ENCOUNTER — Encounter: Payer: Self-pay | Admitting: Cardiology

## 2023-06-25 ENCOUNTER — Telehealth: Payer: Self-pay | Admitting: Cardiovascular Disease

## 2023-06-25 NOTE — Telephone Encounter (Signed)
Patient called to follow-up on test results. 

## 2023-06-25 NOTE — Telephone Encounter (Signed)
 Spoke with pt, she has reviewed her lab work and her TSH is abnormal. Aware the APP has not reviewed yet and we will be in touch once reviewed. Will forward to [employer].

## 2023-06-25 NOTE — Telephone Encounter (Signed)
 Error

## 2023-06-27 DIAGNOSIS — J4521 Mild intermittent asthma with (acute) exacerbation: Secondary | ICD-10-CM | POA: Diagnosis not present

## 2023-06-27 DIAGNOSIS — J329 Chronic sinusitis, unspecified: Secondary | ICD-10-CM | POA: Diagnosis not present

## 2023-06-28 ENCOUNTER — Ambulatory Visit (HOSPITAL_BASED_OUTPATIENT_CLINIC_OR_DEPARTMENT_OTHER)
Admission: RE | Admit: 2023-06-28 | Discharge: 2023-06-28 | Disposition: A | Discharge status: Home / Self Care | Source: Ambulatory Visit | Attending: Family Medicine | Admitting: Family Medicine

## 2023-06-28 ENCOUNTER — Encounter: Payer: Self-pay | Admitting: Family Medicine

## 2023-06-28 ENCOUNTER — Ambulatory Visit (INDEPENDENT_AMBULATORY_CARE_PROVIDER_SITE_OTHER): Admitting: Family Medicine

## 2023-06-28 ENCOUNTER — Telehealth: Payer: Self-pay

## 2023-06-28 VITALS — BP 140/80 | HR 84 | Temp 98.1°F | Resp 18 | Ht 64.0 in | Wt 136.6 lb

## 2023-06-28 DIAGNOSIS — J069 Acute upper respiratory infection, unspecified: Secondary | ICD-10-CM | POA: Insufficient documentation

## 2023-06-28 DIAGNOSIS — I499 Cardiac arrhythmia, unspecified: Secondary | ICD-10-CM | POA: Diagnosis not present

## 2023-06-28 DIAGNOSIS — J849 Interstitial pulmonary disease, unspecified: Secondary | ICD-10-CM | POA: Diagnosis not present

## 2023-06-28 DIAGNOSIS — R059 Cough, unspecified: Secondary | ICD-10-CM | POA: Diagnosis not present

## 2023-06-28 DIAGNOSIS — R918 Other nonspecific abnormal finding of lung field: Secondary | ICD-10-CM | POA: Diagnosis not present

## 2023-06-28 LAB — POCT INFLUENZA A/B
Influenza A, POC: NEGATIVE
Influenza B, POC: NEGATIVE

## 2023-06-28 LAB — POC COVID19 BINAXNOW: SARS Coronavirus 2 Ag: NEGATIVE

## 2023-06-28 MED ORDER — AMOXICILLIN-POT CLAVULANATE 875-125 MG PO TABS: 1.0000 | ORAL_TABLET | Freq: Two times a day (BID) | ORAL | 0 refills | Status: DC

## 2023-06-28 NOTE — Progress Notes (Signed)
 [facility] [address], [address] [place], [place] [address] [phone]  Date:  06/28/2023   Name:  Alexandria [name]   DOB:  1945-10-22   MRN:  [id number]  PCP:  Donato Schultz, DO    Chief Complaint: URI (Ear ache, head hurting, chills. She has recently been treated for pneumonia. )   History of Present Illness:  Alexandria [name] is a 78 y.o. very pleasant female patient who presents with the following:  Pt seen today with concern of illness hx of ILD, chronic venous insufficiency, asthma, hypothyroidism, HFpEF, severe diverticulitis s/p partial colectomy and colostomy.  Heart catheterization 2015 showed no coronary artery disease.  Coronary CTA 10/2019 showed coronary calcium score of 0, no evidence of CAD, dilated proximal ascending aorta measuring 40 mm.   Primary pt of Dr Zola Button although I have seen her several times, most recently in November when she had  a ST She was seen at [facility] yesterday: Assessment/Plan:  1. Exacerbation of intermittent asthma, unspecified asthma severity methylPREDNISolone acetate (DEPO-Medrol) injection 40 mg  albuterol HFA (PROVENTIL HFA;VENTOLIN HFA;PROAIR HFA) 90 mcg/actuation inhaler  2. Chronic congestion of paranasal sinus  Likely allergic symptoms at this point given weather pattern.  Do not feel related to resistant/lingering bacterial infection IM steroid, cont home care  She had a partial colectomy in August 2024- she notes she was told she had pneumonia on a CT scan around that time?  I am not able to locate that particular CT report  I do find a CT of her chest from February of this year-ordered by her pulmonologist Dr. Su Monks for idiopathic pulmonary fibrosis 1. Moderate pulmonary fibrosis in a pattern with apical to basal  gradient featuring irregular peripheral interstitial opacity, septal  thickening, traction bronchiectasis, subpleural bronchiolectasis,  and  honeycombing of the lung bases, slightly worsened compared to  most recent prior examination dated 12/10/2021, and as previously  reported, clearly worsened over a longer period of follow-up.  Findings remain consistent with UIP/IPF.  2. Enlargement of the main pulmonary artery, as can be seen in  pulmonary hypertension.  3. Coronary artery disease.  4. 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 Recommend annual imaging follow-up by  CTA or MRA.   She notes a fever this am - subjective only  She states she has been sick "on and off" for a month  She got a shot of steroids yesterday from urgent care No recent abx use  She has diarrhea but this is longstanding,no vomiting She feels congested in her chest and throat Her right ear is hurting  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   Irritable bowel syndrome without diarrhea 11/19/2022   Ileostomy care (HCC) 11/18/2022   Diaper candidiasis 09/28/2022   Ileostomy, has currently (HCC) 09/28/2022   At risk for dehydration 09/16/2022   At high risk for dehydration 09/14/2022   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   Dehydration 08/26/2022   Irritant contact dermatitis associated with digestive stoma 08/26/2022   Ileostomy in place [facility]) 08/26/2022   High output ileostomy (  HCC) 08/25/2022   Diastolic dysfunction 08/14/2022   Chronic facial pain 08/14/2022   Dyspepsia 08/14/2022   Blowout of rectal stump (HCC) 08/14/2022   Diverticulitis of colon 08/13/2022   Colostomy care (HCC) 07/31/2022   Bronchospasm 06/16/2022   Temporal arteritis (HCC)  06/16/2022   Chronic obstructive pulmonary disease with (acute) lower respiratory infection (HCC) 06/16/2022   Angina pectoris with documented spasm (HCC) 06/16/2022   Abdominal aortic aneurysm (AAA) without rupture (HCC) 06/16/2022   Right ankle sprain 06/09/2022   Sprain of right ankle 06/01/2022   Dizziness 06/01/2022   Rectal abscess 06/01/2022   Acute right ankle pain 05/19/2022   History of low anterior resection of rectum 05/04/2022   Irritant contact dermatitis associated with fecal stoma 05/02/2022   Slow transit constipation 05/02/2022   Colostomy complication (HCC) 05/02/2022   IDA (iron deficiency anemia) 04/19/2022   Intra-abdominal abscess (HCC) 04/18/2022   Acute blood loss anemia 04/17/2022   Chronic diastolic CHF (congestive heart failure) (HCC) 04/17/2022   Toxic metabolic encephalopathy 04/17/2022   Ileus following gastrointestinal surgery (HCC) 04/17/2022   Pelvic abscess in female 03/30/2022   Flat foot 11/19/2021   S/P total knee arthroplasty, left 11/13/2021   Ankle impingement syndrome, left 11/13/2021   Rectal bleeding 08/22/2021   Abnormal thyroid blood test 08/22/2021   Abnormal kidney function 08/22/2021   Dumping syndrome 03/13/2020   Arthritis    Bronchial pneumonia    Colon polyp    DVT (deep venous thrombosis) (HCC)    Heart murmur    Migraines    Pancreatitis    Thyroid disease    Aortic root dilatation (HCC) 11/24/2019   Leukocytosis 11/06/2019   Generalized abdominal pain 11/06/2019   Pre-ulcerative corn or callous 09/07/2019   Nausea 09/07/2019   Pain of left calf 05/09/2019   Tibial pain 05/09/2019   Essential hypertension 02/08/2019   Healthcare maintenance 11/10/2018   Bradycardia 07/22/2018   Pansinusitis 06/23/2018   History of transient ischemic attack (TIA) 03/24/2018   IPF (idiopathic pulmonary fibrosis) (HCC) 03/24/2018   Chronic rhinitis 02/16/2018   Eustachian tube dysfunction, bilateral 02/16/2018   URI (upper  respiratory infection) 02/16/2018   Seasonal allergic rhinitis due to pollen 02/16/2018   ILD (interstitial lung disease) (HCC) 01/07/2018   Dyspnea 01/07/2018   Acute respiratory failure (HCC) 01/06/2018   TIA (transient ischemic attack) 01/06/2018   Hypokalemia 12/15/2017   Chronic cough 11/26/2017   Basal cell carcinoma (BCC) of neck 06/07/2017   Anisometropia 06/02/2017   Drusen of macula of both eyes 06/02/2017   Photopsia of left eye 06/02/2017   Cardiac murmur 05/07/2017   Situational anxiety 05/07/2017   Headache 05/07/2017   Mitral and aortic insufficiency 05/07/2017   Migraine without status migrainosus, not intractable 06/09/2016   Insomnia 11/18/2015   Normal coronary arteries 05/20/2015   RBBB 05/20/2015   Abnormal CT of the chest 05/20/2015   Hx of myocardial infarction 05/08/2015   Bruising 05/08/2015   Upper airway cough syndrome 02/14/2015   Asthmatic bronchitis with acute exacerbation 02/06/2015   Mild intermittent asthma without complication 02/06/2015   Oliguria 12/28/2014   Hypothyroidism 10/25/2014   Fatigue 10/25/2014   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   Chronic venous insufficiency 10/24/2014   Varicose veins of leg with complications 10/24/2014   Degeneration of intervertebral disc of cervical region 06/29/2014   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   Generalized osteoarthritis of multiple sites 06/29/2014   History of stroke without residual deficits 06/29/2014   Palpitations 11/08/2013  Edema of lower extremity 10/03/2013   Hair loss 11/25/2012   Alopecia 11/25/2012   Diverticulitis of large intestine with perforation and abscess 07/14/2012   Hammer toe 06/13/2012   History of stress test 05/21/2011   Hx of echocardiogram 01/27/2010   H/O cardiac catheterization 2004    Past Medical History:  Diagnosis Date   Arthritis    Bronchial pneumonia    Chronic facial pain 08/14/2022   Colon polyp    Diverticulitis     DVT (deep venous thrombosis) (HCC)    lower extremity   H/O cardiac catheterization 2004   Normal coronary arteries   Heart murmur    History of stress test 05/21/2011   Hx of echocardiogram 01/27/2010   Normal Ef 55% the transmitral spectral doppler flow pattern is normal for age. the left ventricular wall motion is normal   Hypothyroidism    IPF (idiopathic pulmonary fibrosis) (HCC) 01/2018   Myocardial infarction (HCC)    hx of 30 years ago   Pancreatitis    TIA (transient ischemic attack)    8 years ago    Past Surgical History:  Procedure Laterality Date   BREAST BIOPSY Left    [facility]   CARDIAC CATHETERIZATION     10/2013   CATARACT EXTRACTION Bilateral 03/22/2018   FLEXIBLE SIGMOIDOSCOPY N/A 08/13/2022   Procedure: FLEXIBLE SIGMOIDOSCOPY;  Surgeon: Karie Soda, MD;  Location: [facility];  Service: General;  Laterality: N/A;   ILEOSTOMY CLOSURE N/A 11/19/2022   Procedure: OPEN TAKEDOWN OF LOOP ILEOSTOMY;  Surgeon: Karie Soda, MD;  Location: [facility];  Service: General;  Laterality: N/A;   LEFT HEART CATHETERIZATION WITH CORONARY ANGIOGRAM N/A 10/25/2013   Procedure: LEFT HEART CATHETERIZATION WITH CORONARY ANGIOGRAM;  Surgeon: Lennette Bihari, MD;  Location: [facility] CATH LAB;  Service: Cardiovascular;  Laterality: N/A;   LYSIS OF ADHESION N/A 08/13/2022   Procedure: LYSIS OF ADHESION, DIVERTING ILEOSTOMY;  Surgeon: Karie Soda, MD;  Location: [facility];  Service: General;  Laterality: N/A;   NECK SURGERY     ACDF x 2      done in [place] more than 25 years, doesn't know levels   TOTAL KNEE ARTHROPLASTY     Bilateral x's 2   VAGINAL HYSTERECTOMY  10/17/1998   Konrad Dolores   VIDEO BRONCHOSCOPY Bilateral 01/24/2018   Procedure: VIDEO BRONCHOSCOPY WITH FLUORO;  Surgeon: Lupita Leash, MD;  Location: [facility] ENDOSCOPY;  Service: Cardiopulmonary;  Laterality: Bilateral;   XI ROBOTIC ASSISTED COLOSTOMY TAKEDOWN N/A 08/13/2022   Procedure: ROBOTIC OSTOMY TAKEDOWN, SMALL BOWEL RESECTION X 2,  BILATERAL TAP BLOCK, TISSUE PERFUSSION ASSESMENT VIA FIREFLY, MOBILAZATION OF SPLENIC FLEXURE;  Surgeon: Karie Soda, MD;  Location: [facility];  Service: General;  Laterality: N/A;   XI ROBOTIC ASSISTED LOWER ANTERIOR RESECTION N/A 03/31/2022   Procedure: XI ROBOTIC ASSISTED LOWER ANTERIOR RESECTION;  Surgeon: Karie Soda, MD;  Location: [facility];  Service: General;  Laterality: N/A;    Social History   Tobacco Use   Smoking status: Never   Smokeless tobacco: Never  Vaping Use   Vaping status: Never Used  Substance Use Topics   Alcohol use: No    Alcohol/week: 0.0 standard drinks of alcohol   Drug use: No    Family History  Problem Relation Age of Onset   Ovarian cancer Mother    Uterine cancer Mother    Lung cancer Father    HIV Brother        1982   Kidney cancer Brother  Lung cancer Brother    Other Brother        Mouth Cancer   Colon cancer Maternal Aunt    Heart disease Maternal Grandmother    Stroke Maternal Grandmother    Hypertension Maternal Grandmother    Diabetes Maternal Grandmother    Arthritis Other    Esophageal cancer Neg Hx    Liver disease Neg Hx    Rectal cancer Neg Hx    Stomach cancer Neg Hx     Allergies  Allergen Reactions   Prednisone Other (See Comments)    Changed personality    Dilaudid [Hydromorphone] Itching and Other (See Comments)    Dilaudid caused marked confusion   Atrovent Hfa [Ipratropium Bromide Hfa] Itching and Other (See Comments)    Pt could not sleep, Insomnia    Cheratussin Ac [Guaifenesin-Codeine] Other (See Comments)    Headaches    Diltiazem Itching and Other (See Comments)    Makes patient sick. Shakes. GI   Hydrocodone Itching   Influenza Vaccines Other (See Comments)    Pt reports heart attack after last flu shot   Morphine Itching   Motrin [Ibuprofen] Itching and Other (See Comments)    "Gives false reading in blood"   Oxycodone Itching   Codeine Itching, Nausea Only and Other (See Comments)    Pt takes  promethazine with codeine at home    Medication list has been reviewed and updated.  Current Outpatient Medications on File Prior to Visit  Medication Sig Dispense Refill   aspirin EC 81 MG tablet Take 81 mg by mouth 3 (three) times a week. Swallow whole.     benzonatate (TESSALON) 200 MG capsule Take 1 capsule (200 mg total) by mouth 3 (three) times daily as needed for cough. 20 capsule 3   diphenoxylate-atropine (LOMOTIL) 2.5-0.025 MG tablet Take 1 tablet by mouth daily.     ferrous sulfate 325 (65 FE) MG tablet Take 1 tablet (325 mg total) by mouth 2 (two) times daily with a meal. 60 tablet 2   furosemide (LASIX) 20 MG tablet Take 1 tablet (20 mg total) by mouth daily. (Patient taking differently: Take 20 mg by mouth daily as needed for edema or fluid.) 30 tablet 3   levothyroxine (SYNTHROID) 125 MCG tablet Take 1 tablet (125 mcg total) by mouth daily. 90 tablet 3   losartan (COZAAR) 25 MG tablet Take 1 tablet (25 mg total) by mouth daily.     magnesium oxide (MAG-OX) 400 MG tablet Take 1 tablet (400 mg total) by mouth daily. 90 tablet 3   Multiple Vitamin (MULTIVITAMIN WITH MINERALS) TABS tablet Take 1 tablet by mouth once a week.     OFEV 100 MG CAPS Take 100 mg by mouth 2 (two) times daily.     ondansetron (ZOFRAN-ODT) 4 MG disintegrating tablet TAKE 1 TABLET BY MOUTH EVERY 8 HOURS AS NEEDED FOR NAUSEA AND VOMITING 60 tablet 2   promethazine-dextromethorphan (PROMETHAZINE-DM) 6.25-15 MG/5ML syrup Take 5 mLs by mouth 4 (four) times daily as needed. 118 mL 0   vitamin C (ASCORBIC ACID) 500 MG tablet Take 500 mg by mouth 2 (two) times a week.     No current facility-administered medications on file prior to visit.    Review of Systems:  As per HPI- otherwise negative.   Physical Examination: Vitals:   06/28/23 0842 06/28/23 0921  BP: (!) 162/90 (!) 140/80  Pulse: 84   Resp: 18   Temp: 98.1 F (36.7 C)   SpO2: 94%  Vitals:   06/28/23 0842  Weight: 136 lb 9.6 oz (62 kg)   Height: 5\' 4"  (1.626 m)   Body mass index is 23.45 kg/m. Ideal Body Weight: Weight in (lb) to have BMI = 25: 145.3  GEN: no acute distress. Normal weight, looks well  HEENT: Atraumatic, Normocephalic.  Pt notes tenderness of right ear with movement but exam is normal Bilateral TM wnl, oropharynx normal.  PEERL,EOMI.   Ears and Nose: No external deformity. CV: RRR, No M/G/R. No JVD. No thrill. No extra heart sounds. PULM: CTA B, no wheezes, crackles, rhonchi. No retractions. No resp. distress. No accessory muscle use. ABD: S, NT, ND, EXTR: No c/c/e PSYCH: Normally interactive. Conversant.   EKG: SR with rt BBB, frequent PVC but no a fib  Compared with tracing from last week at cardiology no significant change is noted Assessment and Plan: Upper respiratory tract infection, unspecified type - Plan: POC COVID-19 BinaxNow, POCT Influenza A/B, DG Chest 2 View  Irregular cardiac rhythm - Plan: EKG 12-Lead  Patient in today with concern of not feeling well.  Her symptoms are somewhat vague but her main concern seems to be upper respiratory infection/bronchitis symptoms  She tested negative for COVID and flu today.  Will obtain a chest x-ray and plan next step who  Receive her chest film as below, called patient She notes Augmentin typically works best for her respiratory infections, I called in the prescription for her. She is seeing her pulmonologist in about 10 days.  However I asked her to let me know if she is not seeing improvement in the next few days, sooner if worse DG Chest 2 View Result Date: 06/28/2023 CLINICAL DATA:  cough and not feeling well. EXAM: CHEST - 2 VIEW COMPARISON:  05/25/2023. FINDINGS: Redemonstration of extensive heterogeneous increased interstitial markings throughout bilateral lungs, favoring underlying chronic interstitial lung disease. No discrete acute dense consolidation or lung collapse seen. Evaluation for acute subtle pneumonitis is not possible on this  exam. Bilateral costophrenic angles are clear. Stable cardio-mediastinal silhouette. No acute osseous abnormalities. Lower cervical spinal fixation hardware noted. The soft tissues are within normal limits. IMPRESSION: *Redemonstration of extensive heterogeneous increased interstitial markings throughout bilateral lungs, favoring underlying chronic interstitial lung disease. No discrete acute dense consolidation or lung collapse seen. Evaluation for acute subtle pneumonitis is not possible on this exam. Electronically Signed   By: Jules Schick M.D.   On: 06/28/2023 10:25     Signed Abbe Amsterdam, MD

## 2023-06-28 NOTE — Telephone Encounter (Signed)
 Copied from CRM [id number]. Topic: Clinical - Lab/Test Results >> Jun 28, 2023 10:43 AM Melissa C wrote: Reason for CRM: patient called stated she was there this morning and was supposed to have a chest x-ray ordered, I think she meant it was ordered and she was waiting on results. She said she got a call from somewhere and tried to answer the phone but her phone acted up and she wanted to see if it was the office. Please advise if it was findings of chest xray. Thank you.

## 2023-06-28 NOTE — Telephone Encounter (Signed)
 Pt called for xray results?

## 2023-06-30 ENCOUNTER — Ambulatory Visit: Admitting: Physician Assistant

## 2023-07-06 ENCOUNTER — Telehealth (INDEPENDENT_AMBULATORY_CARE_PROVIDER_SITE_OTHER): Admitting: Family Medicine

## 2023-07-06 ENCOUNTER — Other Ambulatory Visit: Payer: Self-pay | Admitting: Family Medicine

## 2023-07-06 DIAGNOSIS — E039 Hypothyroidism, unspecified: Secondary | ICD-10-CM | POA: Diagnosis not present

## 2023-07-06 DIAGNOSIS — J189 Pneumonia, unspecified organism: Secondary | ICD-10-CM

## 2023-07-06 MED ORDER — LEVOTHYROXINE SODIUM 125 MCG PO TABS: ORAL_TABLET | ORAL | Status: DC

## 2023-07-06 NOTE — Progress Notes (Signed)
 MyChart Video Visit    Virtual Visit via Video Note   This patient is at least at moderate risk for complications without adequate follow up. This format is felt to be most appropriate for this patient at this time. Physical exam was limited by quality of the video and audio technology used for the visit. Alexandria [name] was able to get the patient set up on a video visit.  Patient location: home  Patient and provider in visit Provider location: Office  I discussed the limitations of evaluation and management by telemedicine and the availability of in person appointments. The patient expressed understanding and agreed to proceed.  Visit Date: 07/06/2023  Today's healthcare provider: Donato Schultz, DO     Subjective:    Patient ID: Alexandria [name], female    DOB: Dec 11, 1945, 78 y.o.   MRN: [id number]  No chief complaint on file.   HPI Patient is in today to discuss her thyroid meds.   Discussed the use of AI scribe software for clinical note transcription with the patient, who gave verbal consent to proceed.  History of Present Illness Alexandria [name] "Alexandria [name]" is a 78 year old female who presents with sinus headaches and drainage.  She experiences persistent sinus headaches and drainage, which she attributes to environmental factors such as pollen. Despite a recent course of Augmentin, there has been no improvement in her symptoms. Previous chest x-rays and an EKG were normal.  She has attempted to manage her sinus issues by avoiding milk and other products that might affect her sinuses, but this has not been effective. She uses saline nasal sprays multiple times a day, Flonase, Vicks, and AYR gel to alleviate nasal congestion. She describes her nose as 'really stuffed' and 'hard', and finds the AYR gel helpful.  Her headaches are severe enough to require medication. She takes Aleve, which does not upset her stomach, at a dose of one every other day when the headaches  become too severe or when her cheekbones hurt. She is cautious about taking too much aspirin.  She is scheduled for nasal surgery on May 4th, hoping it will address her sinus issues. 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    Past Medical History:  Diagnosis Date  . Arthritis   . Bronchial pneumonia   . 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 Colon polyp   . Diverticulitis   . DVT (deep venous thrombosis) (HCC)    lower extremity  . H/O cardiac catheterization 2004   Normal coronary arteries  . Heart murmur   . History of stress test 05/21/2011  . Hx of echocardiogram 01/27/2010   Normal Ef 55% the transmitral spectral doppler flow pattern is normal for age. the left ventricular wall motion is normal  . Hypothyroidism   . IPF (idiopathic pulmonary fibrosis) (HCC) 01/2018  . Myocardial infarction (HCC)    hx of 30 years ago  . Pancreatitis   . TIA (transient ischemic attack)    8 years ago    Past Surgical History:  Procedure Laterality Date  . BREAST BIOPSY Left    Yolanda Bonine  . CARDIAC CATHETERIZATION     10/2013  . CATARACT EXTRACTION Bilateral 03/22/2018  . FLEXIBLE SIGMOIDOSCOPY N/A 08/13/2022   Procedure: FLEXIBLE SIGMOIDOSCOPY;  Surgeon: Karie Soda, MD;  Location: [facility];  Service: General;  Laterality: N/A;  . ILEOSTOMY CLOSURE N/A 11/19/2022   Procedure: OPEN TAKEDOWN OF LOOP  ILEOSTOMY;  Surgeon: Karie Soda, MD;  Location: [facility];  Service: General;  Laterality: N/A;  . LEFT HEART CATHETERIZATION WITH CORONARY ANGIOGRAM N/A 10/25/2013   Procedure: LEFT HEART CATHETERIZATION WITH CORONARY ANGIOGRAM;  Surgeon: Lennette Bihari, MD;  Location: [facility];  Service: Cardiovascular;  Laterality: N/A;  . LYSIS OF ADHESION N/A 08/13/2022   Procedure: LYSIS OF ADHESION, DIVERTING ILEOSTOMY;  Surgeon: Karie Soda, MD;  Location: [facility];  Service: General;  Laterality: N/A;  . NECK SURGERY     ACDF x 2       done in [place] more than 25 years, doesn't know levels  . TOTAL KNEE ARTHROPLASTY     Bilateral x's 2  . VAGINAL HYSTERECTOMY  10/17/1998   Konrad Dolores  . VIDEO BRONCHOSCOPY Bilateral 01/24/2018   Procedure: VIDEO BRONCHOSCOPY WITH FLUORO;  Surgeon: Lupita Leash, MD;  Location: [facility];  Service: Cardiopulmonary;  Laterality: Bilateral;  . XI ROBOTIC ASSISTED COLOSTOMY TAKEDOWN N/A 08/13/2022   Procedure: ROBOTIC OSTOMY TAKEDOWN, SMALL BOWEL RESECTION X 2, BILATERAL TAP BLOCK, TISSUE PERFUSSION ASSESMENT VIA FIREFLY, MOBILAZATION OF SPLENIC FLEXURE;  Surgeon: Karie Soda, MD;  Location: [facility];  Service: General;  Laterality: N/A;  . XI ROBOTIC ASSISTED LOWER ANTERIOR RESECTION N/A 03/31/2022   Procedure: XI ROBOTIC ASSISTED LOWER ANTERIOR RESECTION;  Surgeon: Karie Soda, MD;  Location: [facility];  Service: General;  Laterality: N/A;    Family History  Problem Relation Age of Onset  . Ovarian cancer Mother   . Uterine cancer Mother   . Lung cancer Father   . HIV Brother        31  . Kidney cancer Brother   . Lung cancer Brother   . Other Brother        Mouth Cancer  . Colon cancer Maternal Aunt   . Heart disease Maternal Grandmother   . Stroke Maternal Grandmother   . Hypertension Maternal Grandmother   . Diabetes Maternal Grandmother   . Arthritis Other   . Esophageal cancer Neg Hx   . Liver disease Neg Hx   . Rectal cancer Neg Hx   . Stomach cancer Neg Hx     Social History   Socioeconomic History  . Marital status: Married    Spouse name: Tasia Catchings  . Number of children: 3  . Years of education: [employer]  . Highest education level: Not on file  Occupational History  . Occupation: DISABLED    Employer: UNEMPLOYED  . Occupation: retired  Tobacco Use  . Smoking status: Never  . Smokeless tobacco: Never  Vaping Use  . Vaping status: Never Used  Substance and Sexual Activity  . Alcohol use: No    Alcohol/week: 0.0 standard drinks of alcohol  .  Drug use: No  . Sexual activity: Not Currently  Other Topics Concern  . Not on file  Social History Narrative   Lives with husband Tasia Catchings   Caffeine use: coffee (2 cups per day)   Mostly right-handed   Social Drivers of Corporate investment banker Strain: Not on file  Food Insecurity: Low Risk  (04/26/2023)   Received from [facility]   Hunger Vital Sign   . Worried About Programme researcher, broadcasting/film/video in the Last Year: Never true   . Ran Out of Food in the Last Year: Never true  Transportation Needs: No Transportation Needs (04/26/2023)   Received from [facility]   . In the past 12 months, has  lack of reliable transportation kept you from medical appointments, meetings, work or from getting things needed for daily living? : No  Physical Activity: Not on file  Stress: Not on file  Social Connections: Not on file  Intimate Partner Violence: Patient Declined (11/20/2022)   Humiliation, Afraid, Rape, and Kick questionnaire   . Fear of Current or Ex-Partner: Patient declined   . Emotionally Abused: Patient declined   . Physically Abused: Patient declined   . Sexually Abused: Patient declined    Outpatient Medications Prior to Visit  Medication Sig Dispense Refill  . aspirin EC 81 MG tablet Take 81 mg by mouth 3 (three) times a week. Swallow whole.    . benzonatate (TESSALON) 200 MG capsule Take 1 capsule (200 mg total) by mouth 3 (three) times daily as needed for cough. 20 capsule 3  . diphenoxylate-atropine (LOMOTIL) 2.5-0.025 MG tablet Take 1 tablet by mouth daily.    . ferrous sulfate 325 (65 FE) MG tablet Take 1 tablet (325 mg total) by mouth 2 (two) times daily with a meal. 60 tablet 2  . furosemide (LASIX) 20 MG tablet Take 1 tablet (20 mg total) by mouth daily. (Patient taking differently: Take 20 mg by mouth daily as needed for edema or fluid.) 30 tablet 3  . losartan (COZAAR) 25 MG tablet Take 1 tablet (25 mg total) by mouth daily.    . magnesium oxide (MAG-OX) 400  MG tablet Take 1 tablet (400 mg total) by mouth daily. 90 tablet 3  . Multiple Vitamin (MULTIVITAMIN WITH MINERALS) TABS tablet Take 1 tablet by mouth once a week.    Marland Kitchen OFEV 100 MG CAPS Take 100 mg by mouth 2 (two) times daily.    . ondansetron (ZOFRAN-ODT) 4 MG disintegrating tablet TAKE 1 TABLET BY MOUTH EVERY 8 HOURS AS NEEDED FOR NAUSEA AND VOMITING 60 tablet 2  . promethazine-dextromethorphan (PROMETHAZINE-DM) 6.25-15 MG/5ML syrup Take 5 mLs by mouth 4 (four) times daily as needed. 118 mL 0  . vitamin C (ASCORBIC ACID) 500 MG tablet Take 500 mg by mouth 2 (two) times a week.    Marland Kitchen amoxicillin-clavulanate (AUGMENTIN) 875-125 MG tablet Take 1 tablet by mouth 2 (two) times daily. 14 tablet 0  . levothyroxine (SYNTHROID) 125 MCG tablet Take 1 tablet (125 mcg total) by mouth daily. 90 tablet 3   No facility-administered medications prior to visit.    Allergies  Allergen Reactions  . Prednisone Other (See Comments)    Changed personality   . Dilaudid [Hydromorphone] Itching and Other (See Comments)    Dilaudid caused marked confusion  . Atrovent Hfa [Ipratropium Bromide Hfa] Itching and Other (See Comments)    Pt could not sleep, Insomnia   . Cheratussin Ac [Guaifenesin-Codeine] Other (See Comments)    Headaches   . Diltiazem Itching and Other (See Comments)    Makes patient sick. Shakes. GI  . Hydrocodone Itching  . Influenza Vaccines Other (See Comments)    Pt reports heart attack after last flu shot  . Morphine Itching  . Motrin [Ibuprofen] Itching and Other (See Comments)    "Gives false reading in blood"  . Oxycodone Itching  . Codeine Itching, Nausea Only and Other (See Comments)    Pt takes promethazine with codeine at home    Review of Systems  Constitutional:  Negative for fever and malaise/fatigue.  HENT:  Negative for congestion.   Eyes:  Negative for blurred vision.  Respiratory:  Negative for cough and shortness of breath.  Cardiovascular:  Negative for chest pain,  palpitations and leg swelling.  Gastrointestinal:  Negative for vomiting.  Musculoskeletal:  Negative for back pain.  Skin:  Negative for rash.  Neurological:  Negative for loss of consciousness and headaches.      Objective:    Physical Exam Vitals and nursing note reviewed.  Constitutional:      Appearance: Normal appearance.  Neurological:     Mental Status: She is alert.  Psychiatric:        Mood and Affect: Mood normal.        Behavior: Behavior normal.        Thought Content: Thought content normal.        Judgment: Judgment normal.   There were no vitals taken for this visit. Wt Readings from Last 3 Encounters:  06/28/23 136 lb 9.6 oz (62 kg)  06/23/23 138 lb (62.6 kg)  06/08/23 135 lb 9.6 oz (61.5 kg)       Assessment & Plan:  Hypothyroidism, unspecified type -     Levothyroxine Sodium; 1 PO MWF and 1/2 tab all other days -     Thyroid Panel With TSH; Future     I discussed the assessment and treatment plan with the patient. The patient was provided an opportunity to ask questions and all were answered. The patient agreed with the plan and demonstrated an understanding of the instructions.   The patient was advised to call back or seek an in-person evaluation if the symptoms worsen or if the condition fails to improve as anticipated.  Donato Schultz, DO [facility] [phone] (phone) [phone] (fax)  [facility]

## 2023-07-07 DIAGNOSIS — H5340 Unspecified visual field defects: Secondary | ICD-10-CM | POA: Diagnosis not present

## 2023-07-12 DIAGNOSIS — R0602 Shortness of breath: Secondary | ICD-10-CM | POA: Diagnosis not present

## 2023-07-12 DIAGNOSIS — J849 Interstitial pulmonary disease, unspecified: Secondary | ICD-10-CM | POA: Diagnosis not present

## 2023-07-12 DIAGNOSIS — J84112 Idiopathic pulmonary fibrosis: Secondary | ICD-10-CM | POA: Diagnosis not present

## 2023-07-12 DIAGNOSIS — Z79899 Other long term (current) drug therapy: Secondary | ICD-10-CM | POA: Diagnosis not present

## 2023-07-26 ENCOUNTER — Ambulatory Visit: Admitting: Dermatology

## 2023-07-26 DIAGNOSIS — B351 Tinea unguium: Secondary | ICD-10-CM | POA: Diagnosis not present

## 2023-07-26 DIAGNOSIS — L600 Ingrowing nail: Secondary | ICD-10-CM | POA: Diagnosis not present

## 2023-07-26 DIAGNOSIS — M79672 Pain in left foot: Secondary | ICD-10-CM | POA: Diagnosis not present

## 2023-07-26 DIAGNOSIS — M2042 Other hammer toe(s) (acquired), left foot: Secondary | ICD-10-CM | POA: Diagnosis not present

## 2023-07-26 DIAGNOSIS — M79671 Pain in right foot: Secondary | ICD-10-CM | POA: Diagnosis not present

## 2023-07-26 DIAGNOSIS — M2062 Acquired deformities of toe(s), unspecified, left foot: Secondary | ICD-10-CM | POA: Diagnosis not present

## 2023-07-29 ENCOUNTER — Telehealth: Payer: Self-pay | Admitting: Cardiovascular Disease

## 2023-07-29 DIAGNOSIS — R000 Tachycardia, unspecified: Secondary | ICD-10-CM | POA: Diagnosis not present

## 2023-07-29 NOTE — Telephone Encounter (Signed)
 Patient is returning call.

## 2023-07-29 NOTE — Telephone Encounter (Signed)
 Patient calling in regarding her heart monitor results. Please advise

## 2023-07-29 NOTE — Telephone Encounter (Signed)
 Left voice message to call back 4/24

## 2023-07-29 NOTE — Telephone Encounter (Signed)
 Spoke to patient she was calling for monitor results.Advised monitor results are not available yet.Stated she is scheduled to have sinus surgery 5/5.She was told she needs results by Fri 5/2 or surgery would be cancelled.Advised I will make Dr.Croitoru aware.

## 2023-07-30 DIAGNOSIS — R000 Tachycardia, unspecified: Secondary | ICD-10-CM

## 2023-07-30 NOTE — Telephone Encounter (Signed)
 Called patient left message on personal voice mail Dr.Croitoru reviewed monitor which is low risk.You are cleared to have upcoming sinus surgery.Advised Dr.Croitoru sent you a [employer].

## 2023-07-30 NOTE — Telephone Encounter (Signed)
 Alexandria [name], her monitor report is ready and low risk. Not anything that would preclude sinus surgery. The result probably went to Angie's inbasket, not mine, so I cannot direct it to the patient via mychart.

## 2023-08-03 ENCOUNTER — Other Ambulatory Visit: Payer: Self-pay | Admitting: Family Medicine

## 2023-08-03 DIAGNOSIS — J189 Pneumonia, unspecified organism: Secondary | ICD-10-CM

## 2023-08-03 NOTE — Telephone Encounter (Signed)
 Copied from CRM [id number]. Topic: Clinical - Medication Refill >> Aug 03, 2023  1:58 PM Albertha Alosa wrote: Most Recent Primary Care Visit:  Provider: Roel Clarity R  Department: [facility]  Visit Type: OFFICE VISIT  Date: 07/06/2023  Medication: promethazine -dextromethorphan (PROMETHAZINE -DM) 6.25-15 MG/5ML syrup  Has the patient contacted their pharmacy? Yes (Agent: If no, request that the patient contact the pharmacy for the refill. If patient does not wish to contact the pharmacy document the reason why and proceed with request.) (Agent: If yes, when and what did the pharmacy advise?)  Is this the correct pharmacy for this prescription? Yes If no, delete pharmacy and type the correct one.  This is the patient's preferred pharmacy:  [id number] - [place], [place] - [address] [place] [address] Phone: [phone] Fax: [phone]    Has the prescription been filled recently? No  Is the patient out of the medication? Yes  Has the patient been seen for an appointment in the last year OR does the patient have an upcoming appointment? Yes  Can we respond through MyChart? Yes  Agent: Please be advised that Rx refills may take up to 3 business days. We ask that you follow-up with your pharmacy.

## 2023-08-04 ENCOUNTER — Ambulatory Visit: Admitting: Cardiovascular Disease

## 2023-08-04 ENCOUNTER — Encounter: Payer: Self-pay | Admitting: Cardiovascular Disease

## 2023-08-04 VITALS — BP 140/86 | HR 88 | Ht 64.0 in | Wt 134.0 lb

## 2023-08-04 DIAGNOSIS — E7800 Pure hypercholesterolemia, unspecified: Secondary | ICD-10-CM

## 2023-08-04 DIAGNOSIS — I4510 Unspecified right bundle-branch block: Secondary | ICD-10-CM | POA: Diagnosis not present

## 2023-08-04 DIAGNOSIS — I5032 Chronic diastolic (congestive) heart failure: Secondary | ICD-10-CM | POA: Diagnosis not present

## 2023-08-04 DIAGNOSIS — I351 Nonrheumatic aortic (valve) insufficiency: Secondary | ICD-10-CM | POA: Diagnosis not present

## 2023-08-04 DIAGNOSIS — I491 Atrial premature depolarization: Secondary | ICD-10-CM

## 2023-08-04 DIAGNOSIS — I4719 Other supraventricular tachycardia: Secondary | ICD-10-CM

## 2023-08-04 NOTE — Progress Notes (Signed)
 Cardiology Office Note:    Date:  08/04/2023   ID:  Alexandria [name], DOB 1945-06-20, MRN [id number]  PCP:  Alexandria [name], Alexandria Chalk, DO   [facility] Providers Cardiologist:  None     Referring MD: Alexandria [name], Alexandria [name], *   Chief Complaint  Patient presents with   Irregular Heart Beat    History of Present Illness:    Alexandria [name] is a 78 y.o. female with a hx of colostomy following partial colectomy for severe diverticulitis, interstitial lung disease, chronic venous insufficiency of the lower extremities, bronchial asthma, hypothyroidism, reported diagnosis of diastolic heart failure referred for incidental bradycardia.  She is here to follow-up after wearing an event monitor.  She has had a long recovery from her abdominal infection and surgery with complicated diverticulitis in December.  She had severe sigmoid diverticulosis with a pericolic fluid collection consistent with diverticular abscess.  She had rectosigmoid resection with colostomy performed by Dr. Hershell [name] 03/31/2022.  She had problems with new abscess formation that required interventional radiology guided drainage on 04/12/2022.  Prolonged ileus led to need for TPN and she received transfusions for anemia.  She finally underwent takedown of her colostomy 11/19/2022 and was discharged from the hospital 11/23/2022.    She has had an unfavorable result after nasal surgery and needs to undergo another procedure, currently scheduled for 08/09/2023.    Her monitor did not show any episodes of severe bradycardia.  This confirms that she has artifactual reports of bradycardia, which are due to under-counting of her true heart rate when she has PACs (these are relatively frequent representing 5% of all recorded beats).  There was also evidence of relatively frequent but brief episodes of nonsustained ectopic atrial tachycardia, but there was no atrial fibrillation.   Coronary angiography in 2015 showed no  coronary artery disease and a recent coronary CT angiogram showed a calcium  score of 0, although there was mild aortic atherosclerosis.  In 2021 a 30-day event monitor for complaints of dizziness showed occasional episodes of brief paroxysmal atrial tachycardia but did not show any meaningful bradycardia or AV block.  There was no atrial fibrillation.  Her echocardiogram in April 2024 showed normal left ventricular function, aortic valve sclerosis without stenosis but with mild-moderate insufficiency and mild dilation of the ascending aorta (on CT this was 4.1 cm).  There was "grade 1 diastolic dysfunction (impaired relaxation)".  The mitral annular diastolic velocities were quite low at 2.5 and 2.8 cm/s.    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 the left ventricular wall motion is normal   Hypothyroidism    IPF (idiopathic pulmonary fibrosis) (HCC) 01/2018   Myocardial infarction (HCC)    hx of 30 years ago   Pancreatitis    TIA (transient ischemic attack)    8 years ago    Past Surgical History:  Procedure Laterality Date   BREAST BIOPSY Left    Alexandria [name]   CARDIAC CATHETERIZATION     10/2013   CATARACT EXTRACTION Bilateral 03/22/2018  FLEXIBLE SIGMOIDOSCOPY N/A 08/13/2022   Procedure: FLEXIBLE SIGMOIDOSCOPY;  Surgeon: Alexandria Champagne, MD;  Location: [facility];  Service: General;  Laterality: N/A;   ILEOSTOMY CLOSURE N/A 11/19/2022   Procedure: OPEN TAKEDOWN OF LOOP ILEOSTOMY;  Surgeon: Alexandria Champagne, MD;  Location: [facility];  Service: General;  Laterality: N/A;   LEFT HEART CATHETERIZATION WITH CORONARY  ANGIOGRAM N/A 10/25/2013   Procedure: LEFT HEART CATHETERIZATION WITH CORONARY ANGIOGRAM;  Surgeon: Alexandria Ally, MD;  Location: [facility] CATH LAB;  Service: Cardiovascular;  Laterality: N/A;   LYSIS OF ADHESION N/A 08/13/2022   Procedure: LYSIS OF ADHESION, DIVERTING ILEOSTOMY;  Surgeon: Alexandria Champagne, MD;  Location: [facility];  Service: General;  Laterality: N/A;   NECK SURGERY     ACDF x 2      done in [place] more than 25 years, doesn't know levels   TOTAL KNEE ARTHROPLASTY     Bilateral x's 2   VAGINAL HYSTERECTOMY  10/17/1998   Alexandria [name]   VIDEO BRONCHOSCOPY Bilateral 01/24/2018   Procedure: VIDEO BRONCHOSCOPY WITH FLUORO;  Surgeon: Alexandria Sia, MD;  Location[facility] ENDOSCOPY;  Service: Cardiopulmonary;  Laterality: Bilateral;   XI ROBOTIC ASSISTED COLOSTOMY TAKEDOWN N/A 08/13/2022   Procedure: ROBOTIC OSTOMY TAKEDOWN, SMALL BOWEL RESECTION X 2, BILATERAL TAP BLOCK, TISSUE PERFUSSION ASSESMENT VIA FIREFLY, MOBILAZATION OF SPLENIC FLEXURE;  Surgeon: Alexandria Champagne, MD;  Location: [facility];  Service: General;  Laterality: N/A;   XI ROBOTIC ASSISTED LOWER ANTERIOR RESECTION N/A 03/31/2022   Procedure: XI ROBOTIC ASSISTED LOWER ANTERIOR RESECTION;  Surgeon: Alexandria Champagne, MD;  Location: [facility];  Service: General;  Laterality: N/A;    Current Medications: Current Meds  Medication Sig   albuterol  (VENTOLIN  HFA) 108 (90 Base) MCG/ACT inhaler Inhale 2 puffs into the lungs every 6 (six) hours as needed (coughing).   aspirin  EC 81 MG tablet Take 81 mg by mouth 3 (three) times a week. Swallow whole.   benzonatate  (TESSALON ) 200 MG capsule Take 1 capsule (200 mg total) by mouth 3 (three) times daily as needed for cough.   clopidogrel  (PLAVIX ) 75 MG tablet Take 1 tablet by mouth every other day.   diphenoxylate-atropine (LOMOTIL) 2.5-0.025 MG tablet Take 2 tablets by mouth 3 (three) times daily as needed for diarrhea or loose stools.   folic acid  (FOLVITE ) 400 MCG tablet Take 1 tablet by mouth every  other day.   furosemide  (LASIX ) 20 MG tablet Take 1 tablet (20 mg total) by mouth daily. (Patient taking differently: Take 20 mg by mouth daily as needed for edema or fluid.)   levothyroxine  (SYNTHROID ) 112 MCG tablet Take 112 mcg by mouth every other day.   levothyroxine  (SYNTHROID ) 125 MCG tablet 1 PO MWF and 1/2 tab all other days   losartan  (COZAAR ) 25 MG tablet Take 1 tablet (25 mg total) by mouth daily.   magnesium  oxide (MAG-OX) 400 MG tablet Take 1 tablet (400 mg total) by mouth daily.   Multiple Vitamin (MULTIVITAMIN WITH MINERALS) TABS tablet Take 1 tablet by mouth once a week.   OFEV  100 MG CAPS Take 100 mg by mouth 2 (two) times daily.   ondansetron  (ZOFRAN -ODT) 4 MG disintegrating tablet TAKE 1 TABLET BY MOUTH EVERY 8 HOURS AS NEEDED FOR NAUSEA AND VOMITING   vitamin C  (ASCORBIC ACID ) 500 MG tablet Take 500 mg by mouth 2 (two) times a week.     Allergies:   Prednisone , Dilaudid  [hydromorphone ], Ipratropium bromide , Atrovent  hfa [ipratropium bromide  hfa], Cheratussin ac [guaifenesin -codeine ], Diltiazem , Hydrocodone, Influenza  vaccines, Morphine , Oxycodone , Codeine , and Ibuprofen    Family History: The patient's family history includes Arthritis in an other family member; Colon cancer in her maternal aunt; Diabetes in her maternal grandmother; HIV in her brother; Heart disease in her maternal grandmother; Hypertension in her maternal grandmother; Kidney cancer in her brother; Lung cancer in her brother and father; Other in her brother; Ovarian cancer in her mother; Stroke in her maternal grandmother; Uterine cancer in her mother. There is no history of Esophageal cancer, Liver disease, Rectal cancer, or Stomach cancer.  ROS:   Please see the history of present illness.     All other systems reviewed and are negative.  EKGs/Labs/Other Studies Reviewed:    The following studies were reviewed today:  Coronary CT angiogram 10/06/2019  1. Coronary calcium  score of 0. This was 0  percentile for age and sex matched control.   2. Normal coronary origin with right dominance.   3. No evidence of CAD.   4.  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   5.  Mild mitral annular calcification.   5.  Aortic Atherosclerosis.    Arrhythmia monitor 01/12/2020  Conclusion: This study is remarkable for supraventricular tachycardia which is likely atrial tachycardia with variable block.   Arrhythmia monitor 07/30/2023   The dominant rhythm is normal sinus rhythm with normal circadian rhythm variation.   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  The longest episode is 19 seconds in duration.  There is no evidence of atrial fibrillation.   There are rare premature ventricular contractions and a single 4 beat episode of nonsustained ventricular tachycardia.   There is no evidence of severe bradycardia sinus pauses or high-grade AV block.   Mildly abnormal arrhythmia monitor due to the occurrence of occasional premature atrial contractions and occasional brief episodes of nonsustained ectopic atrial tachycardia.  No high risk arrhythmia is detected.  Echocardiogram 07/09/2022   1. Left ventricular ejection fraction, by estimation, is 55 to 60%. The  left ventricle has normal function. The left ventricle has no regional  wall motion abnormalities. There is mild concentric left ventricular  hypertrophy. Left ventricular diastolic  parameters are consistent with Grade I diastolic dysfunction (impaired  relaxation).   2. Right ventricular systolic function is normal. The right ventricular  size is normal. Tricuspid regurgitation signal is inadequate for assessing  PA pressure.   3. The mitral valve is normal in structure. Mild mitral valve  regurgitation. No evidence of mitral stenosis. Moderate mitral annular   calcification.   4. The aortic valve is tricuspid. There is mild calcification of the  aortic valve. Aortic valve regurgitation is mild to moderate. No aortic  stenosis is present.   5. Aortic dilatation noted. There is mild dilatation of the ascending  aorta, measuring 42 mm.   6. IVC not visualized.   7. Frequent PACs noted   EKG:  EKG is not ordered today.  Most recent ECG from 06/23/2023 is personally reviewed and shows normal sinus rhythm and right bundle branch block The ekg ordered 07/13/2022 shows sinus rhythm with atrial trigeminy and right bundle branch block The ECG from 05/28/2022 shows sinus rhythm with atrial trigeminy and right bundle branch block. ECG from 03/19/2022 shows sinus rhythm and bifascicular block (RBBB plus LAFB)  Recent Labs: 04/23/2023: ALT 10 06/23/2023: BUN 15; Creatinine, Ser 0.96; Hemoglobin 13.6; Magnesium  2.2; Platelets 208; Potassium 4.6; Sodium  142; TSH 0.217  Recent Lipid Panel    Component Value Date/Time   CHOL 177 12/04/2022 1505   CHOL 183 06/15/2022 0956   TRIG 95 12/04/2022 1505   HDL 44 (L) 12/04/2022 1505   HDL 42 06/15/2022 0956   CHOLHDL 4.0 12/04/2022 1505   VLDL 18.8 11/26/2022 1516   LDLCALC 113 (H) 12/04/2022 1505     Risk Assessment/Calculations:            Physical Exam:    VS:  BP (!) 140/86 (BP Location: Left Arm, Patient Position: Sitting, Cuff Size: Normal)   Pulse 88   Ht 5\' 4"  (1.626 m)   Wt 60.8 kg   SpO2 97%   BMI 23.00 kg/m     Wt Readings from Last 3 Encounters:  08/04/23 60.8 kg  06/28/23 62 kg  06/23/23 62.6 kg     General: Alert, oriented x3, no distress, appears lean and fit Head: no evidence of trauma, PERRL, EOMI, no exophtalmos or lid lag, no myxedema, no xanthelasma; normal ears, nose and oropharynx Neck: normal jugular venous pulsations and no hepatojugular reflux; brisk carotid pulses without delay and no carotid bruits Chest: clear to auscultation, no signs of consolidation by  percussion or palpation, normal fremitus, symmetrical and full respiratory excursions Cardiovascular: normal position and quality of the apical impulse, regular rhythm, normal first and widely split second heart sounds, 2/6 decrescendo diastolic murmur heard best at the right lower sternal border, no systolic murmurs, rubs or gallops Abdomen: no tenderness or distention, no masses by palpation, no abnormal pulsatility or arterial bruits, normal bowel sounds, no hepatosplenomegaly Extremities: no clubbing, cyanosis or edema; 2+ radial, ulnar and brachial pulses bilaterally; 2+ right femoral, posterior tibial and dorsalis pedis pulses; 2+ left femoral, posterior tibial and dorsalis pedis pulses; no subclavian or femoral bruits Neurological: grossly nonfocal Psych: Normal mood and affect    ASSESSMENT:    1. Paroxysmal atrial tachycardia (HCC)   2. Premature atrial contractions   3. RBBB   4. Chronic diastolic CHF (congestive heart failure) (HCC)   5. Nonrheumatic aortic valve insufficiency   6. Hypercholesterolemia      PLAN:    In order of problems listed above:  PACs/nonsustained atrial tachycardia: Relatively frequent on her monitor (5% of all recorded beats are PACs).  Responsible for artifactual bradycardia, due to pulse deficit (under counting of her PACs when peripheral pulse palpation/pulse oximetry/automatic blood pressure cuff are used to measure her heart rate, not confirmed by electrical tracings).   RBBB: Chronic longstanding abnormality, sometimes with left axis deviation due to left anterior fascicular block.  There has been no evidence of second-degree or third-degree AV block on previous and most recent event monitor.  Would avoid medications with negative chronotropic effect such as beta-blockers or centrally acting calcium  channel blockers CHF: She has mild diastolic dysfunction (probably appropriate for age) and clinically appears euvolemic.  Avoid diuretics which could  worsen her problems with orthostatic hypotension.  Would avoid SGLT2 inhibitors due to previous problems with pelvic infections. Breathing problems more likely related to ILD. AI: The murmur is louder than expected based on echo findings.  Due to aortoannular ectasia. Mildly dilated ascending aorta at 41 mm.  Progression between echos from 2022 and 2024. HLP: LDL cholesterol 113 is higher than desired (target less than 100 due to the presence of aortic atherosclerosis).  Due for repeat labs in August.         Disposition: No change in current medications.  Low  risk for major cardiac complications with planned nasal surgery.  Signed, Luana Rumple, MD  08/04/2023 5:12 PM    [facility]

## 2023-08-05 ENCOUNTER — Ambulatory Visit (INDEPENDENT_AMBULATORY_CARE_PROVIDER_SITE_OTHER): Admitting: Physician Assistant

## 2023-08-05 ENCOUNTER — Encounter: Payer: Self-pay | Admitting: Physician Assistant

## 2023-08-05 ENCOUNTER — Ambulatory Visit: Admitting: Physician Assistant

## 2023-08-05 VITALS — BP 154/90 | HR 89 | Temp 97.9°F | Resp 18 | Ht 64.0 in | Wt 136.0 lb

## 2023-08-05 DIAGNOSIS — J189 Pneumonia, unspecified organism: Secondary | ICD-10-CM

## 2023-08-05 DIAGNOSIS — E039 Hypothyroidism, unspecified: Secondary | ICD-10-CM | POA: Diagnosis not present

## 2023-08-05 DIAGNOSIS — J84112 Idiopathic pulmonary fibrosis: Secondary | ICD-10-CM | POA: Diagnosis not present

## 2023-08-05 DIAGNOSIS — Z01818 Encounter for other preprocedural examination: Secondary | ICD-10-CM

## 2023-08-05 MED ORDER — ALBUTEROL SULFATE HFA 108 (90 BASE) MCG/ACT IN AERS: 2.0000 | INHALATION_SPRAY | Freq: Four times a day (QID) | RESPIRATORY_TRACT | 1 refills | Status: DC | PRN

## 2023-08-05 MED ORDER — PROMETHAZINE-DM 6.25-15 MG/5ML PO SYRP: 5.0000 mL | ORAL_SOLUTION | Freq: Four times a day (QID) | ORAL | 0 refills | Status: DC | PRN

## 2023-08-05 NOTE — Telephone Encounter (Signed)
 Spoke to patient she stated she needs Dr.Croitoru's office note faxed to [facility].Stated she is having sinus surgery Mon 5/5.Dr.Croitoru's 4/30 office note faxed to [facility] at fax # [phone].

## 2023-08-05 NOTE — Telephone Encounter (Signed)
 Pt would a form sent to the office she's having surgery at to verify her clearance. Pt didn't have a fax number. Please advise

## 2023-08-05 NOTE — Progress Notes (Addendum)
 Established patient visit   Patient: Alexandria [name]   DOB: November 18, 1945   78 y.o. Female  MRN: [id number] Visit Date: 08/05/2023  Today's healthcare provider: Trenton Frock, PA-C   Chief Complaint  Patient presents with   Pre-op Exam    Pt states having sinus surgery at [facility] with Dr. Johnnie Nan.   Subjective     Pt presents today for surgical clearance. She saw cardiology yesterday and her pulmonologist earlier this month for follow ups. PMH of PAT, RBBB, mild diastolic dysfunction, TIA, HTN, IPF, AAA, aortic root dilation.   Pt is asymptomatic today. No acute concerns.   Medications: Outpatient Medications Prior to Visit  Medication Sig   aspirin  EC 81 MG tablet Take 81 mg by mouth 3 (three) times a week. Swallow whole.   benzonatate  (TESSALON ) 200 MG capsule Take 1 capsule (200 mg total) by mouth 3 (three) times daily as needed for cough.   clopidogrel  (PLAVIX ) 75 MG tablet Take 1 tablet by mouth every other day.   diphenoxylate-atropine (LOMOTIL) 2.5-0.025 MG tablet Take 2 tablets by mouth 3 (three) times daily as needed for diarrhea or loose stools.   folic acid  (FOLVITE ) 400 MCG tablet Take 1 tablet by mouth every other day.   furosemide  (LASIX ) 20 MG tablet Take 1 tablet (20 mg total) by mouth daily. (Patient taking differently: Take 20 mg by mouth daily as needed for edema or fluid.)   levothyroxine  (SYNTHROID ) 112 MCG tablet Take 112 mcg by mouth every other day.   levothyroxine  (SYNTHROID ) 125 MCG tablet 1 PO MWF and 1/2 tab all other days   losartan  (COZAAR ) 25 MG tablet Take 1 tablet (25 mg total) by mouth daily.   magnesium  oxide (MAG-OX) 400 MG tablet Take 1 tablet (400 mg total) by mouth daily.   Multiple Vitamin (MULTIVITAMIN WITH MINERALS) TABS tablet Take 1 tablet by mouth once a week.   OFEV  100 MG CAPS Take 100 mg by mouth 2 (two) times daily.   ondansetron  (ZOFRAN -ODT) 4 MG disintegrating tablet TAKE 1 TABLET BY MOUTH EVERY 8 HOURS AS NEEDED FOR  NAUSEA AND VOMITING   vitamin C  (ASCORBIC ACID ) 500 MG tablet Take 500 mg by mouth 2 (two) times a week.   [DISCONTINUED] albuterol  (VENTOLIN  HFA) 108 (90 Base) MCG/ACT inhaler Inhale 2 puffs into the lungs every 6 (six) hours as needed (coughing).   [DISCONTINUED] promethazine -dextromethorphan (PROMETHAZINE -DM) 6.25-15 MG/5ML syrup TAKE BY MOUTH 4 TIMES A DAY AS NEEDED   No facility-administered medications prior to visit.    Review of Systems  Constitutional:  Negative for fatigue and fever.  Respiratory:  Negative for cough and shortness of breath.   Cardiovascular:  Negative for chest pain and leg swelling.  Gastrointestinal:  Negative for abdominal pain.  Neurological:  Negative for dizziness and headaches.       Objective    BP (!) 154/90   Pulse 89   Temp 97.9 F (36.6 C) (Oral)   Resp 18   Ht 5\' 4"  (1.626 m)   Wt 136 lb (61.7 kg)   SpO2 95%   BMI 23.34 kg/m    Physical Exam Constitutional:      General: She is awake.     Appearance: She is well-developed.  HENT:     Head: Normocephalic.  Eyes:     Conjunctiva/sclera: Conjunctivae normal.  Cardiovascular:     Rate and Rhythm: Normal rate and regular rhythm.     Heart sounds: Normal heart  sounds.  Pulmonary:     Effort: Pulmonary effort is normal.     Breath sounds: Normal breath sounds.  Skin:    General: Skin is warm.  Neurological:     Mental Status: She is alert and oriented to person, place, and time.  Psychiatric:        Attention and Perception: Attention normal.        Mood and Affect: Mood normal.        Speech: Speech normal.        Behavior: Behavior is cooperative.     No results found for any visits on 08/05/23.  Assessment & Plan    Encounter for preoperative assessment -     CBC with Differential/Platelet -     Comprehensive metabolic panel with GFR -     EKG 12-Lead  Hypothyroidism, unspecified type -     TSH -     T4, free  Pneumonia of right lower lobe due to infectious  organism -     Promethazine -DM; Take 5 mLs by mouth 4 (four) times daily as needed for cough.  Dispense: 118 mL; Refill: 0  IPF (idiopathic pulmonary fibrosis) (HCC) -     Albuterol  Sulfate HFA; Inhale 2 puffs into the lungs every 6 (six) hours as needed (coughing).  Dispense: 8 g; Refill: 1 -     Promethazine -DM; Take 5 mLs by mouth 4 (four) times daily as needed for cough.  Dispense: 118 mL; Refill: 0   Pt is euvolemic, BP is mildly elevated but pt attributes to stress and being late for appointment. Chest clear, heart regular.   Recent chest xray stable 06/28/23  EKG today sinus rhythm, RBBB stable no acute changes  Pt is cleared for surgery pending labs with mild to moderate risk given detailed comorbidity above and age.   Addendum 08/06/23 Labs resulted , normal/stable, cleared.   Trenton Frock, PA-C  [facility] [phone] (phone) [phone] (fax)  [facility]

## 2023-08-06 ENCOUNTER — Encounter: Payer: Self-pay | Admitting: Physician Assistant

## 2023-08-06 ENCOUNTER — Ambulatory Visit: Admitting: Internal Medicine

## 2023-08-06 LAB — CBC WITH DIFFERENTIAL/PLATELET
Basophils Absolute: 0.1 10*3/uL (ref 0.0–0.1)
Basophils Relative: 1.2 % (ref 0.0–3.0)
Eosinophils Absolute: 0.5 10*3/uL (ref 0.0–0.7)
Eosinophils Relative: 9.7 % — ABNORMAL HIGH (ref 0.0–5.0)
HCT: 41.9 % (ref 36.0–46.0)
Hemoglobin: 13.9 g/dL (ref 12.0–15.0)
Lymphocytes Relative: 17.6 % (ref 12.0–46.0)
Lymphs Abs: 1 10*3/uL (ref 0.7–4.0)
MCHC: 33.2 g/dL (ref 30.0–36.0)
MCV: 96.8 fl (ref 78.0–100.0)
Monocytes Absolute: 0.8 10*3/uL (ref 0.1–1.0)
Monocytes Relative: 14.7 % — ABNORMAL HIGH (ref 3.0–12.0)
Neutro Abs: 3.1 10*3/uL (ref 1.4–7.7)
Neutrophils Relative %: 56.8 % (ref 43.0–77.0)
Platelets: 235 10*3/uL (ref 150.0–400.0)
RBC: 4.33 Mil/uL (ref 3.87–5.11)
RDW: 14.8 % (ref 11.5–15.5)
WBC: 5.5 10*3/uL (ref 4.0–10.5)

## 2023-08-06 LAB — T4, FREE: Free T4: 1.44 ng/dL (ref 0.60–1.60)

## 2023-08-06 LAB — COMPREHENSIVE METABOLIC PANEL WITH GFR
ALT: 10 U/L (ref 0–35)
AST: 17 U/L (ref 0–37)
Albumin: 4.1 g/dL (ref 3.5–5.2)
Alkaline Phosphatase: 90 U/L (ref 39–117)
BUN: 18 mg/dL (ref 6–23)
CO2: 29 meq/L (ref 19–32)
Calcium: 9.4 mg/dL (ref 8.4–10.5)
Chloride: 102 meq/L (ref 96–112)
Creatinine, Ser: 0.82 mg/dL (ref 0.40–1.20)
GFR: 68.41 mL/min (ref >60.00)
Glucose, Bld: 87 mg/dL (ref 70–99)
Potassium: 3.9 meq/L (ref 3.5–5.1)
Sodium: 141 meq/L (ref 135–145)
Total Bilirubin: 0.7 mg/dL (ref 0.2–1.2)
Total Protein: 7.4 g/dL (ref 6.0–8.3)

## 2023-08-06 LAB — TSH: TSH: 0.52 u[IU]/mL (ref 0.35–5.50)

## 2023-08-07 ENCOUNTER — Other Ambulatory Visit: Payer: Self-pay | Admitting: Family Medicine

## 2023-08-07 DIAGNOSIS — J84112 Idiopathic pulmonary fibrosis: Secondary | ICD-10-CM

## 2023-08-07 DIAGNOSIS — J189 Pneumonia, unspecified organism: Secondary | ICD-10-CM

## 2023-08-07 MED ORDER — PROMETHAZINE-DM 6.25-15 MG/5ML PO SYRP: 5.0000 mL | ORAL_SOLUTION | Freq: Four times a day (QID) | ORAL | 0 refills | Status: DC | PRN

## 2023-08-09 DIAGNOSIS — M950 Acquired deformity of nose: Secondary | ICD-10-CM | POA: Diagnosis not present

## 2023-08-09 DIAGNOSIS — J342 Deviated nasal septum: Secondary | ICD-10-CM | POA: Diagnosis not present

## 2023-08-09 DIAGNOSIS — J34829 Nasal valve collapse, unspecified: Secondary | ICD-10-CM | POA: Diagnosis not present

## 2023-08-09 DIAGNOSIS — J3489 Other specified disorders of nose and nasal sinuses: Secondary | ICD-10-CM | POA: Diagnosis not present

## 2023-08-09 DIAGNOSIS — J343 Hypertrophy of nasal turbinates: Secondary | ICD-10-CM | POA: Diagnosis not present

## 2023-08-16 HISTORY — PX: RHINOPLASTY: SUR1284

## 2023-08-24 ENCOUNTER — Ambulatory Visit: Payer: Self-pay | Admitting: Family Medicine

## 2023-08-24 ENCOUNTER — Ambulatory Visit (INDEPENDENT_AMBULATORY_CARE_PROVIDER_SITE_OTHER): Admitting: Family Medicine

## 2023-08-24 ENCOUNTER — Ambulatory Visit (HOSPITAL_BASED_OUTPATIENT_CLINIC_OR_DEPARTMENT_OTHER)
Admission: RE | Admit: 2023-08-24 | Discharge: 2023-08-24 | Disposition: A | Discharge status: Home / Self Care | Source: Ambulatory Visit | Attending: Family Medicine | Admitting: Family Medicine

## 2023-08-24 ENCOUNTER — Encounter: Payer: Self-pay | Admitting: Family Medicine

## 2023-08-24 VITALS — BP 150/88 | HR 90 | Temp 98.1°F | Resp 20 | Ht 64.0 in | Wt 138.0 lb

## 2023-08-24 DIAGNOSIS — R0689 Other abnormalities of breathing: Secondary | ICD-10-CM | POA: Diagnosis not present

## 2023-08-24 DIAGNOSIS — J84112 Idiopathic pulmonary fibrosis: Secondary | ICD-10-CM | POA: Insufficient documentation

## 2023-08-24 DIAGNOSIS — R0902 Hypoxemia: Secondary | ICD-10-CM | POA: Diagnosis not present

## 2023-08-24 DIAGNOSIS — Z8673 Personal history of transient ischemic attack (TIA), and cerebral infarction without residual deficits: Secondary | ICD-10-CM | POA: Diagnosis not present

## 2023-08-24 DIAGNOSIS — J168 Pneumonia due to other specified infectious organisms: Secondary | ICD-10-CM | POA: Diagnosis not present

## 2023-08-24 DIAGNOSIS — I5033 Acute on chronic diastolic (congestive) heart failure: Secondary | ICD-10-CM | POA: Diagnosis not present

## 2023-08-24 DIAGNOSIS — R053 Chronic cough: Secondary | ICD-10-CM | POA: Diagnosis not present

## 2023-08-24 DIAGNOSIS — Z79899 Other long term (current) drug therapy: Secondary | ICD-10-CM | POA: Diagnosis not present

## 2023-08-24 DIAGNOSIS — Z86718 Personal history of other venous thrombosis and embolism: Secondary | ICD-10-CM | POA: Diagnosis not present

## 2023-08-24 DIAGNOSIS — I252 Old myocardial infarction: Secondary | ICD-10-CM | POA: Diagnosis not present

## 2023-08-24 DIAGNOSIS — R918 Other nonspecific abnormal finding of lung field: Secondary | ICD-10-CM | POA: Diagnosis not present

## 2023-08-24 DIAGNOSIS — Z96653 Presence of artificial knee joint, bilateral: Secondary | ICD-10-CM | POA: Diagnosis not present

## 2023-08-24 DIAGNOSIS — I700 Atherosclerosis of aorta: Secondary | ICD-10-CM | POA: Diagnosis not present

## 2023-08-24 DIAGNOSIS — I77810 Thoracic aortic ectasia: Secondary | ICD-10-CM | POA: Diagnosis not present

## 2023-08-24 DIAGNOSIS — Z888 Allergy status to other drugs, medicaments and biological substances status: Secondary | ICD-10-CM | POA: Diagnosis not present

## 2023-08-24 DIAGNOSIS — Z823 Family history of stroke: Secondary | ICD-10-CM | POA: Diagnosis not present

## 2023-08-24 DIAGNOSIS — J189 Pneumonia, unspecified organism: Secondary | ICD-10-CM | POA: Diagnosis not present

## 2023-08-24 DIAGNOSIS — Z833 Family history of diabetes mellitus: Secondary | ICD-10-CM | POA: Diagnosis not present

## 2023-08-24 DIAGNOSIS — Z7982 Long term (current) use of aspirin: Secondary | ICD-10-CM | POA: Diagnosis not present

## 2023-08-24 DIAGNOSIS — R0600 Dyspnea, unspecified: Secondary | ICD-10-CM | POA: Diagnosis not present

## 2023-08-24 DIAGNOSIS — Z8249 Family history of ischemic heart disease and other diseases of the circulatory system: Secondary | ICD-10-CM | POA: Diagnosis not present

## 2023-08-24 DIAGNOSIS — J9601 Acute respiratory failure with hypoxia: Secondary | ICD-10-CM | POA: Diagnosis not present

## 2023-08-24 DIAGNOSIS — Z885 Allergy status to narcotic agent status: Secondary | ICD-10-CM | POA: Diagnosis not present

## 2023-08-24 DIAGNOSIS — J90 Pleural effusion, not elsewhere classified: Secondary | ICD-10-CM | POA: Diagnosis not present

## 2023-08-24 DIAGNOSIS — E039 Hypothyroidism, unspecified: Secondary | ICD-10-CM | POA: Diagnosis not present

## 2023-08-24 DIAGNOSIS — Z79890 Hormone replacement therapy (postmenopausal): Secondary | ICD-10-CM | POA: Diagnosis not present

## 2023-08-24 DIAGNOSIS — I110 Hypertensive heart disease with heart failure: Secondary | ICD-10-CM | POA: Diagnosis not present

## 2023-08-24 MED ORDER — AZITHROMYCIN 250 MG PO TABS: ORAL_TABLET | ORAL | 0 refills | Status: DC

## 2023-08-24 MED ORDER — IPRATROPIUM-ALBUTEROL 0.5-2.5 (3) MG/3ML IN SOLN
3.0000 mL | Freq: Once | RESPIRATORY_TRACT | Status: AC
  Administered 2023-08-24: 3 mL via RESPIRATORY_TRACT

## 2023-08-24 MED ORDER — AMOXICILLIN-POT CLAVULANATE 875-125 MG PO TABS
1.0000 | ORAL_TABLET | Freq: Two times a day (BID) | ORAL | 0 refills | Status: DC
Start: 2023-08-24 — End: 2023-08-28

## 2023-08-24 MED ORDER — METHYLPREDNISOLONE ACETATE 80 MG/ML IJ SUSP
80.0000 mg | Freq: Once | INTRAMUSCULAR | Status: AC
  Administered 2023-08-24: 80 mg via INTRAMUSCULAR

## 2023-08-24 MED ORDER — IPRATROPIUM-ALBUTEROL 0.5-2.5 (3) MG/3ML IN SOLN
3.0000 mL | Freq: Four times a day (QID) | RESPIRATORY_TRACT | 1 refills | Status: DC | PRN
Start: 2023-08-24 — End: 2023-09-03

## 2023-08-24 NOTE — Progress Notes (Signed)
 Established Patient Office Visit  Subjective   Patient ID: Alexandria [name], female    DOB: November 15, 1945  Age: 78 y.o. MRN: [id number]  Chief Complaint  Patient presents with   Sinus Problem    Pt had surgery 2 weeks ago monday    HPI Discussed the use of AI scribe software for clinical note transcription with the patient, who gave verbal consent to proceed.  History of Present Illness Alexandria [name] "[name]" is a 78 year old female with a history of high blood pressure and asthma who presents with shortness of breath and nasal congestion.  She experiences shortness of breath and a sensation of 'drowning' in her head, particularly on one side, with associated ear pain. Her nose feels 'full of blood', and she coughs up 'white bile crap'. These symptoms have been present for a few days, with a fever noted today, although no temperature was recorded when checked.  She has a history of high blood pressure, which she states is 'always high'. Despite feeling hot, she does not have a recorded fever.  She is currently using an inhaler, which she feels is not effective. She is allergic to prednisone , which alters her personality, but can tolerate a Depo Medrol  shot. She is taking thyroid  medication, specifically 112 mcg and 125 mcg doses, but notes her thyroid  levels are 'just on the borderline of okay'.  She mentions using peroxide and water  around her neck and a cream for her symptoms. She has a history of asthma and has consulted allergists in the past without significant relief.   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   Irritable bowel  syndrome without diarrhea 11/19/2022   Ileostomy care (HCC) 11/18/2022   Diaper candidiasis 09/28/2022   Ileostomy, has currently (HCC) 09/28/2022   At risk for dehydration 09/16/2022   At high risk for dehydration 09/14/2022   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   Dehydration 08/26/2022   Irritant contact dermatitis associated with digestive stoma 08/26/2022   Ileostomy in place [facility]) 08/26/2022   High output ileostomy (HCC) 08/25/2022   Diastolic dysfunction 08/14/2022   Chronic facial pain 08/14/2022   Dyspepsia 08/14/2022   Blowout of rectal stump (HCC) 08/14/2022   Diverticulitis of colon 08/13/2022   Colostomy care (HCC) 07/31/2022   Bronchospasm 06/16/2022   Temporal arteritis (HCC) 06/16/2022   Chronic obstructive pulmonary disease with (acute) lower respiratory infection (HCC) 06/16/2022   Angina pectoris with documented spasm (HCC) 06/16/2022   Abdominal aortic aneurysm (AAA) without rupture (HCC) 06/16/2022   Right ankle sprain 06/09/2022   Sprain of right ankle 06/01/2022   Dizziness 06/01/2022   Rectal abscess 06/01/2022   Acute right ankle pain 05/19/2022   History of low anterior resection of rectum 05/04/2022   Irritant contact dermatitis associated with fecal stoma 05/02/2022   Slow transit constipation 05/02/2022   Colostomy complication (HCC) 05/02/2022   IDA (iron  deficiency anemia) 04/19/2022   Intra-abdominal abscess (HCC) 04/18/2022   Acute blood loss anemia 04/17/2022   Chronic diastolic CHF (congestive heart failure) (HCC) 04/17/2022   Toxic metabolic encephalopathy 04/17/2022   Ileus following gastrointestinal surgery (HCC) 04/17/2022   Pelvic abscess in female 03/30/2022   Flat  foot 11/19/2021   S/P total knee arthroplasty, left 11/13/2021   Ankle impingement syndrome, left 11/13/2021   Rectal bleeding 08/22/2021   Abnormal thyroid  blood test 08/22/2021   Abnormal kidney function 08/22/2021   Dumping syndrome 03/13/2020   Arthritis    Bronchial  pneumonia    Colon polyp    DVT (deep venous thrombosis) (HCC)    Heart murmur    Migraines    Pancreatitis    Thyroid  disease    Aortic root dilatation (HCC) 11/24/2019   Leukocytosis 11/06/2019   Generalized abdominal pain 11/06/2019   Pre-ulcerative corn or callous 09/07/2019   Nausea 09/07/2019   Pain of left calf 05/09/2019   Tibial pain 05/09/2019   Essential hypertension 02/08/2019   Healthcare maintenance 11/10/2018   Bradycardia 07/22/2018   Pansinusitis 06/23/2018   History of transient ischemic attack (TIA) 03/24/2018   IPF (idiopathic pulmonary fibrosis) (HCC) 03/24/2018   Chronic rhinitis 02/16/2018   Eustachian tube dysfunction, bilateral 02/16/2018   URI (upper respiratory infection) 02/16/2018   Seasonal allergic rhinitis due to pollen 02/16/2018   ILD (interstitial lung disease) (HCC) 01/07/2018   Dyspnea 01/07/2018   Acute respiratory failure (HCC) 01/06/2018   TIA (transient ischemic attack) 01/06/2018   Hypokalemia 12/15/2017   Chronic cough 11/26/2017   Basal cell carcinoma (BCC) of neck 06/07/2017   Anisometropia 06/02/2017   Drusen of macula of both eyes 06/02/2017   Photopsia of left eye 06/02/2017   Cardiac murmur 05/07/2017   Situational anxiety 05/07/2017   Headache 05/07/2017   Mitral and aortic insufficiency 05/07/2017   Migraine without status migrainosus, not intractable 06/09/2016   Insomnia 11/18/2015   Normal coronary arteries 05/20/2015   RBBB 05/20/2015   Abnormal CT of the chest 05/20/2015   Hx of myocardial infarction 05/08/2015   Bruising 05/08/2015   Upper airway cough syndrome 02/14/2015   Asthmatic bronchitis with acute exacerbation 02/06/2015   Mild intermittent asthma without complication 02/06/2015   Oliguria 12/28/2014   Hypothyroidism 10/25/2014   Fatigue 10/25/2014   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   Chronic venous insufficiency 10/24/2014   Varicose veins of leg with complications 10/24/2014   Degeneration of  intervertebral disc of cervical region 06/29/2014   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   Generalized osteoarthritis of multiple sites 06/29/2014   History of stroke without residual deficits 06/29/2014   Palpitations 11/08/2013   Edema of lower extremity 10/03/2013   Hair loss 11/25/2012   Alopecia 11/25/2012   Diverticulitis of large intestine with perforation and abscess 07/14/2012   Hammer toe 06/13/2012   History of stress test 05/21/2011   Hx of echocardiogram 01/27/2010   H/O cardiac catheterization 2004   Past Medical History:  Diagnosis Date   Arthritis    Bronchial pneumonia    Chronic facial pain 08/14/2022   Colon polyp    Diverticulitis    DVT (deep venous thrombosis) (HCC)    lower extremity   H/O cardiac catheterization 2004   Normal coronary arteries   Heart murmur    History of stress test 05/21/2011   Hx of echocardiogram 01/27/2010   Normal Ef 55% the transmitral spectral doppler flow pattern is normal for age. the left ventricular wall motion is normal   Hypothyroidism    IPF (idiopathic pulmonary fibrosis) (HCC) 01/2018   Myocardial infarction (HCC)    hx of 30 years ago   Pancreatitis    TIA (transient ischemic attack)    8 years ago   Past Surgical History:  Procedure Laterality  Date   BREAST BIOPSY Left    Lorna Rose   CARDIAC CATHETERIZATION     10/2013   CATARACT EXTRACTION Bilateral 03/22/2018   FLEXIBLE SIGMOIDOSCOPY N/A 08/13/2022   Procedure: FLEXIBLE SIGMOIDOSCOPY;  Surgeon: Candyce Champagne, MD;  Location: [facility];  Service: General;  Laterality: N/A;   ILEOSTOMY CLOSURE N/A 11/19/2022   Procedure: OPEN TAKEDOWN OF LOOP ILEOSTOMY;  Surgeon: Candyce Champagne, MD;  Location: [facility];  Service: General;  Laterality: N/A;   LEFT HEART CATHETERIZATION WITH CORONARY ANGIOGRAM N/A 10/25/2013   Procedure: LEFT HEART CATHETERIZATION WITH CORONARY ANGIOGRAM;  Surgeon: Millicent Ally, MD;  Location[facility] CATH LAB;  Service: Cardiovascular;   Laterality: N/A;   LYSIS OF ADHESION N/A 08/13/2022   Procedure: LYSIS OF ADHESION, DIVERTING ILEOSTOMY;  Surgeon: Candyce Champagne, MD;  Location: [facility];  Service: General;  Laterality: N/A;   NECK SURGERY     ACDF x 2      done in [place] more than 25 years, doesn't know levels   TOTAL KNEE ARTHROPLASTY     Bilateral x's 2   VAGINAL HYSTERECTOMY  10/17/1998   Danley Dusky   VIDEO BRONCHOSCOPY Bilateral 01/24/2018   Procedure: VIDEO BRONCHOSCOPY WITH FLUORO;  Surgeon: Marine Sia, MD;  Location: [facility] ENDOSCOPY;  Service: Cardiopulmonary;  Laterality: Bilateral;   XI ROBOTIC ASSISTED COLOSTOMY TAKEDOWN N/A 08/13/2022   Procedure: ROBOTIC OSTOMY TAKEDOWN, SMALL BOWEL RESECTION X 2, BILATERAL TAP BLOCK, TISSUE PERFUSSION ASSESMENT VIA FIREFLY, MOBILAZATION OF SPLENIC FLEXURE;  Surgeon: Candyce Champagne, MD;  Location: [facility];  Service: General;  Laterality: N/A;   XI ROBOTIC ASSISTED LOWER ANTERIOR RESECTION N/A 03/31/2022   Procedure: XI ROBOTIC ASSISTED LOWER ANTERIOR RESECTION;  Surgeon: Candyce Champagne, MD;  Location: [facility];  Service: General;  Laterality: N/A;   Social History   Tobacco Use   Smoking status: Never   Smokeless tobacco: Never  Vaping Use   Vaping status: Never Used  Substance Use Topics   Alcohol  use: No    Alcohol /week: 0.0 standard drinks of alcohol    Drug use: No   Social History   Socioeconomic History   Marital status: Married    Spouse name: Cortland Ding   Number of children: 3   Years of education: Jr college   Highest education level: Not on file  Occupational History   Occupation: Leisure centre manager: UNEMPLOYED   Occupation: retired  Tobacco Use   Smoking status: Never   Smokeless tobacco: Never  Vaping Use   Vaping status: Never Used  Substance and Sexual Activity   Alcohol  use: No    Alcohol /week: 0.0 standard drinks of alcohol    Drug use: No   Sexual activity: Not Currently  Other Topics Concern   Not on file  Social History Narrative   Lives  with husband Cortland Ding   Caffeine use: coffee (2 cups per day)   Mostly right-handed   Social Drivers of Corporate investment banker Strain: Not on file  Food Insecurity: Low Risk  (04/26/2023)   Received from [facility]   Hunger Vital Sign    Worried About Running Out of Food in the Last Year: Never true    Ran Out of Food in the Last Year: Never true  Transportation Needs: No Transportation Needs (04/26/2023)   Received from [facility]    In the past 12 months, has lack of reliable transportation kept you from medical appointments, meetings, work or from getting things needed for daily  living? : No  Physical Activity: Not on file  Stress: Not on file  Social Connections: Not on file  Intimate Partner Violence: Patient Declined (11/20/2022)   Humiliation, Afraid, Rape, and Kick questionnaire    Fear of Current or Ex-Partner: Patient declined    Emotionally Abused: Patient declined    Physically Abused: Patient declined    Sexually Abused: Patient declined   Family Status  Relation Name Status   Mother  Deceased   Father  Deceased   Brother  (Not Specified)   Brother  (Not Specified)   Brother  (Not Specified)   Brother  (Not Specified)   Brother  (Not Specified)   Mat Aunt  (Not Specified)   MGM  (Not Specified)   Other  (Not Specified)   Neg Hx  (Not Specified)  No partnership data on file   Family History  Problem Relation Age of Onset   Ovarian cancer Mother    Uterine cancer Mother    Lung cancer Father    HIV Brother        1982   Kidney cancer Brother    Lung cancer Brother    Other Brother        Mouth Cancer   Colon cancer Maternal Aunt    Heart disease Maternal Grandmother    Stroke Maternal Grandmother    Hypertension Maternal Grandmother    Diabetes Maternal Grandmother    Arthritis Other    Esophageal cancer Neg Hx    Liver disease Neg Hx    Rectal cancer Neg Hx    Stomach cancer Neg Hx    Allergies  Allergen Reactions    Prednisone  Other (See Comments)    Changed personality    Dilaudid  [Hydromorphone ] Itching and Other (See Comments)    Dilaudid  caused marked confusion   Ipratropium Bromide  Itching and Other (See Comments)    Other Reaction(s): Mental Status Changes  Insomnia   Atrovent  Hfa [Ipratropium Bromide  Hfa] Itching and Other (See Comments)    Pt could not sleep, Insomnia    Cheratussin Ac [Guaifenesin -Codeine ] Other (See Comments)    Headaches    Diltiazem  Itching and Other (See Comments)    Makes patient sick. Shakes. GI   Hydrocodone Itching   Influenza Vaccines Other (See Comments)    Pt reports heart attack after last flu shot   Morphine  Itching   Oxycodone  Itching   Codeine  Itching, Nausea Only and Other (See Comments)    Pt takes promethazine  with codeine  at home   Ibuprofen  Itching and Other (See Comments)    "Gives false reading in blood"      Review of Systems  Constitutional:  Negative for fever and malaise/fatigue.  HENT:  Negative for congestion. Sinus pain: .Aaron Aas  Eyes:  Negative for blurred vision.  Respiratory:  Positive for cough and sputum production. Negative for shortness of breath.   Cardiovascular:  Negative for chest pain, palpitations and leg swelling.  Gastrointestinal:  Negative for abdominal pain, blood in stool and nausea.  Genitourinary:  Negative for dysuria and frequency.  Musculoskeletal:  Negative for falls.  Skin:  Negative for rash.  Neurological:  Negative for dizziness, loss of consciousness and headaches.  Endo/Heme/Allergies:  Negative for environmental allergies.  Psychiatric/Behavioral:  Negative for depression. The patient is not nervous/anxious.       Objective:     BP (!) 150/88 (BP Location: Left Arm, Patient Position: Sitting, Cuff Size: Normal)   Pulse 90   Temp 98.1 F (36.7 C) (  Oral)   Resp 20   Ht 5\' 4"  (1.626 m)   Wt 138 lb (62.6 kg)   SpO2 (!) 88%   BMI 23.69 kg/m  BP Readings from Last 3 Encounters:  08/24/23 (!) 150/88   08/05/23 (!) 154/90  08/04/23 (!) 140/86   Wt Readings from Last 3 Encounters:  08/24/23 138 lb (62.6 kg)  08/05/23 136 lb (61.7 kg)  08/04/23 134 lb (60.8 kg)   SpO2 Readings from Last 3 Encounters:  08/24/23 (!) 88%  08/05/23 95%  08/04/23 97%      Physical Exam Vitals and nursing note reviewed.  Constitutional:      General: She is not in acute distress.    Appearance: Normal appearance. She is well-developed.  HENT:     Head: Normocephalic and atraumatic.  Eyes:     General: No scleral icterus.       Right eye: No discharge.        Left eye: No discharge.  Cardiovascular:     Rate and Rhythm: Normal rate and regular rhythm.     Heart sounds: No murmur heard. Pulmonary:     Effort: Pulmonary effort is normal. No respiratory distress.     Breath sounds: Normal breath sounds.  Musculoskeletal:        General: Normal range of motion.     Cervical back: Normal range of motion and neck supple.     Right lower leg: No edema.     Left lower leg: No edema.  Skin:    General: Skin is warm and dry.  Neurological:     Mental Status: She is alert and oriented to person, place, and time.  Psychiatric:        Mood and Affect: Mood normal.        Behavior: Behavior normal.        Thought Content: Thought content normal.        Judgment: Judgment normal.      No results found for any visits on 08/24/23.  Last CBC Lab Results  Component Value Date   WBC 5.5 08/05/2023   HGB 13.9 08/05/2023   HCT 41.9 08/05/2023   MCV 96.8 08/05/2023   MCH 31.8 06/23/2023   RDW 14.8 08/05/2023   PLT 235.0 08/05/2023   Last metabolic panel Lab Results  Component Value Date   GLUCOSE 87 08/05/2023   NA 141 08/05/2023   K 3.9 08/05/2023   CL 102 08/05/2023   CO2 29 08/05/2023   BUN 18 08/05/2023   CREATININE 0.82 08/05/2023   GFR 68.41 08/05/2023   CALCIUM  9.4 08/05/2023   PHOS 2.4 (L) 04/21/2022   PROT 7.4 08/05/2023   ALBUMIN  4.1 08/05/2023   BILITOT 0.7 08/05/2023    ALKPHOS 90 08/05/2023   AST 17 08/05/2023   ALT 10 08/05/2023   ANIONGAP 11 11/20/2022   Last lipids Lab Results  Component Value Date   CHOL 177 12/04/2022   HDL 44 (L) 12/04/2022   LDLCALC 113 (H) 12/04/2022   TRIG 95 12/04/2022   CHOLHDL 4.0 12/04/2022   Last hemoglobin A1c Lab Results  Component Value Date   HGBA1C 5.4 04/05/2022   Last thyroid  functions Lab Results  Component Value Date   TSH 0.52 08/05/2023   T4TOTAL 11.0 08/22/2021   Last vitamin D  Lab Results  Component Value Date   VD25OH 29.9 (L) 05/09/2019   Last vitamin B12 and Folate Lab Results  Component Value Date   VITAMINB12 640 04/18/2022  FOLATE 10.8 04/18/2022      The ASCVD Risk score (Arnett DK, et al., 2019) failed to calculate for the following reasons:   Risk score cannot be calculated because patient has a medical history suggesting prior/existing ASCVD    Assessment & Plan:   Problem List Items Addressed This Visit       Unprioritized   IPF (idiopathic pulmonary fibrosis) (HCC)   Relevant Orders   DG Chest 2 View (Completed)   Pneumonia of right lower lobe due to infectious organism   ?pneumonia on xray Take augmentin  and z pak added Depo medrol  given       Relevant Medications   amoxicillin -clavulanate (AUGMENTIN ) 875-125 MG tablet   Other Visit Diagnoses       Pneumonia due to infectious organism, unspecified laterality, unspecified part of lung    -  Primary   Relevant Medications   amoxicillin -clavulanate (AUGMENTIN ) 875-125 MG tablet   ipratropium-albuterol  (DUONEB) 0.5-2.5 (3) MG/3ML nebulizer solution 3 mL (Completed)     Pt will get a neb machine---  duoneb sent to pharmacy Neb given in office---- pulse ox up to 96% and increase air movement on auscultation   Return if symptoms worsen or fail to improve.    Daveda Larock R Lowne Chase, DO

## 2023-08-24 NOTE — Assessment & Plan Note (Signed)
?  pneumonia on xray Take augmentin  and z pak added Depo medrol  given

## 2023-08-24 NOTE — Patient Instructions (Signed)

## 2023-08-25 ENCOUNTER — Inpatient Hospital Stay (HOSPITAL_BASED_OUTPATIENT_CLINIC_OR_DEPARTMENT_OTHER)
Admission: EM | Admit: 2023-08-25 | Discharge: 2023-08-28 | DRG: 193 | Disposition: A | Discharge status: Home / Self Care | Attending: Family Medicine | Admitting: Family Medicine

## 2023-08-25 ENCOUNTER — Encounter (HOSPITAL_BASED_OUTPATIENT_CLINIC_OR_DEPARTMENT_OTHER): Payer: Self-pay | Admitting: Emergency Medicine

## 2023-08-25 ENCOUNTER — Other Ambulatory Visit: Payer: Self-pay

## 2023-08-25 ENCOUNTER — Emergency Department (HOSPITAL_BASED_OUTPATIENT_CLINIC_OR_DEPARTMENT_OTHER)

## 2023-08-25 ENCOUNTER — Other Ambulatory Visit (HOSPITAL_COMMUNITY): Payer: Self-pay

## 2023-08-25 ENCOUNTER — Telehealth: Payer: Self-pay

## 2023-08-25 DIAGNOSIS — Z8249 Family history of ischemic heart disease and other diseases of the circulatory system: Secondary | ICD-10-CM

## 2023-08-25 DIAGNOSIS — Z8673 Personal history of transient ischemic attack (TIA), and cerebral infarction without residual deficits: Secondary | ICD-10-CM | POA: Diagnosis not present

## 2023-08-25 DIAGNOSIS — Z86718 Personal history of other venous thrombosis and embolism: Secondary | ICD-10-CM | POA: Diagnosis not present

## 2023-08-25 DIAGNOSIS — R053 Chronic cough: Secondary | ICD-10-CM

## 2023-08-25 DIAGNOSIS — J189 Pneumonia, unspecified organism: Principal | ICD-10-CM | POA: Diagnosis present

## 2023-08-25 DIAGNOSIS — J84112 Idiopathic pulmonary fibrosis: Secondary | ICD-10-CM | POA: Diagnosis present

## 2023-08-25 DIAGNOSIS — I110 Hypertensive heart disease with heart failure: Secondary | ICD-10-CM | POA: Diagnosis present

## 2023-08-25 DIAGNOSIS — Z888 Allergy status to other drugs, medicaments and biological substances status: Secondary | ICD-10-CM | POA: Diagnosis not present

## 2023-08-25 DIAGNOSIS — J9601 Acute respiratory failure with hypoxia: Secondary | ICD-10-CM | POA: Diagnosis present

## 2023-08-25 DIAGNOSIS — E039 Hypothyroidism, unspecified: Secondary | ICD-10-CM | POA: Diagnosis present

## 2023-08-25 DIAGNOSIS — Z96653 Presence of artificial knee joint, bilateral: Secondary | ICD-10-CM | POA: Diagnosis present

## 2023-08-25 DIAGNOSIS — Z79899 Other long term (current) drug therapy: Secondary | ICD-10-CM | POA: Diagnosis not present

## 2023-08-25 DIAGNOSIS — Z833 Family history of diabetes mellitus: Secondary | ICD-10-CM | POA: Diagnosis not present

## 2023-08-25 DIAGNOSIS — Z885 Allergy status to narcotic agent status: Secondary | ICD-10-CM

## 2023-08-25 DIAGNOSIS — R0902 Hypoxemia: Secondary | ICD-10-CM

## 2023-08-25 DIAGNOSIS — I5033 Acute on chronic diastolic (congestive) heart failure: Secondary | ICD-10-CM | POA: Diagnosis present

## 2023-08-25 DIAGNOSIS — Z7982 Long term (current) use of aspirin: Secondary | ICD-10-CM | POA: Diagnosis not present

## 2023-08-25 DIAGNOSIS — I252 Old myocardial infarction: Secondary | ICD-10-CM

## 2023-08-25 DIAGNOSIS — Z79890 Hormone replacement therapy (postmenopausal): Secondary | ICD-10-CM

## 2023-08-25 DIAGNOSIS — Z823 Family history of stroke: Secondary | ICD-10-CM

## 2023-08-25 LAB — PRO BRAIN NATRIURETIC PEPTIDE: Pro Brain Natriuretic Peptide: 713 pg/mL — ABNORMAL HIGH (ref <300.0)

## 2023-08-25 LAB — CBC WITH DIFFERENTIAL/PLATELET
Abs Immature Granulocytes: 0.04 10*3/uL (ref 0.00–0.07)
Basophils Absolute: 0.1 10*3/uL (ref 0.0–0.1)
Basophils Relative: 1 %
Eosinophils Absolute: 0.8 10*3/uL — ABNORMAL HIGH (ref 0.0–0.5)
Eosinophils Relative: 10 %
HCT: 37.3 % (ref 36.0–46.0)
Hemoglobin: 12.3 g/dL (ref 12.0–15.0)
Immature Granulocytes: 1 %
Lymphocytes Relative: 16 %
Lymphs Abs: 1.2 10*3/uL (ref 0.7–4.0)
MCH: 31.8 pg (ref 26.0–34.0)
MCHC: 33 g/dL (ref 30.0–36.0)
MCV: 96.4 fL (ref 80.0–100.0)
Monocytes Absolute: 1 10*3/uL (ref 0.1–1.0)
Monocytes Relative: 14 %
Neutro Abs: 4.5 10*3/uL (ref 1.7–7.7)
Neutrophils Relative %: 58 %
Platelets: 268 10*3/uL (ref 150–400)
RBC: 3.87 MIL/uL (ref 3.87–5.11)
RDW: 14.3 % (ref 11.5–15.5)
WBC: 7.7 10*3/uL (ref 4.0–10.5)
nRBC: 0 % (ref 0.0–0.2)

## 2023-08-25 LAB — PROCALCITONIN: Procalcitonin: <0.1 ng/mL

## 2023-08-25 LAB — BASIC METABOLIC PANEL WITH GFR
Anion gap: 8 (ref 5–15)
BUN: 12 mg/dL (ref 8–23)
CO2: 29 mmol/L (ref 22–32)
Calcium: 9.5 mg/dL (ref 8.9–10.3)
Chloride: 104 mmol/L (ref 98–111)
Creatinine, Ser: 0.73 mg/dL (ref 0.44–1.00)
GFR, Estimated: >60 mL/min (ref >60)
Glucose, Bld: 112 mg/dL — ABNORMAL HIGH (ref 70–99)
Potassium: 3.6 mmol/L (ref 3.5–5.1)
Sodium: 141 mmol/L (ref 135–145)

## 2023-08-25 LAB — TROPONIN T, HIGH SENSITIVITY
Troponin T High Sensitivity: 23 ng/L — ABNORMAL HIGH (ref <19)
Troponin T High Sensitivity: 15 ng/L (ref <19)

## 2023-08-25 MED ORDER — ALBUTEROL (5 MG/ML) CONTINUOUS INHALATION SOLN: 2.5000 mg/h | INHALATION_SOLUTION | RESPIRATORY_TRACT | Status: DC | PRN

## 2023-08-25 MED ORDER — IOHEXOL 350 MG/ML SOLN
75.0000 mL | Freq: Once | INTRAVENOUS | Status: AC | PRN
Start: 2023-08-25 — End: 2023-08-25
  Administered 2023-08-25: 75 mL via INTRAVENOUS

## 2023-08-25 MED ORDER — ALBUTEROL SULFATE (2.5 MG/3ML) 0.083% IN NEBU
INHALATION_SOLUTION | RESPIRATORY_TRACT | Status: AC
  Filled 2023-08-25: qty 3

## 2023-08-25 MED ORDER — LACTATED RINGERS IV BOLUS: 500.0000 mL | Freq: Once | INTRAVENOUS | Status: DC

## 2023-08-25 MED ORDER — BENZONATATE 100 MG PO CAPS
200.0000 mg | ORAL_CAPSULE | Freq: Two times a day (BID) | ORAL | Status: DC | PRN
  Administered 2023-08-27: 200 mg via ORAL
  Filled 2023-08-25: qty 2

## 2023-08-25 MED ORDER — SODIUM CHLORIDE 0.9 % IV SOLN
500.0000 mg | INTRAVENOUS | Status: DC
  Administered 2023-08-26: 500 mg via INTRAVENOUS
  Filled 2023-08-25 (×2): qty 5

## 2023-08-25 MED ORDER — SODIUM CHLORIDE 0.9 % IV SOLN
2.0000 g | Freq: Once | INTRAVENOUS | Status: AC
  Administered 2023-08-25: 2 g via INTRAVENOUS
  Filled 2023-08-25: qty 20

## 2023-08-25 MED ORDER — SODIUM CHLORIDE 0.9 % IV SOLN
500.0000 mg | Freq: Once | INTRAVENOUS | Status: AC
  Administered 2023-08-25: 500 mg via INTRAVENOUS
  Filled 2023-08-25: qty 5

## 2023-08-25 MED ORDER — LOSARTAN POTASSIUM 50 MG PO TABS
50.0000 mg | ORAL_TABLET | Freq: Every day | ORAL | Status: DC
  Administered 2023-08-25 – 2023-08-28 (×4): 50 mg via ORAL
  Filled 2023-08-25 (×3): qty 1
  Filled 2023-08-25: qty 2

## 2023-08-25 MED ORDER — ALBUTEROL (5 MG/ML) CONTINUOUS INHALATION SOLN: 2.5000 mg | INHALATION_SOLUTION | RESPIRATORY_TRACT | Status: DC | PRN

## 2023-08-25 MED ORDER — SODIUM CHLORIDE 0.9 % IV SOLN
2.0000 g | INTRAVENOUS | Status: DC
  Administered 2023-08-26 – 2023-08-28 (×3): 2 g via INTRAVENOUS
  Filled 2023-08-25 (×3): qty 20

## 2023-08-25 MED ORDER — ALBUTEROL SULFATE (2.5 MG/3ML) 0.083% IN NEBU
2.5000 mg | INHALATION_SOLUTION | RESPIRATORY_TRACT | Status: DC | PRN
  Administered 2023-08-25: 2.5 mg via RESPIRATORY_TRACT

## 2023-08-25 MED ORDER — ALBUTEROL SULFATE (2.5 MG/3ML) 0.083% IN NEBU: 2.5000 mg | INHALATION_SOLUTION | Freq: Four times a day (QID) | RESPIRATORY_TRACT | Status: DC | PRN

## 2023-08-25 MED ORDER — ACETAMINOPHEN 325 MG PO TABS
650.0000 mg | ORAL_TABLET | Freq: Four times a day (QID) | ORAL | Status: DC | PRN
  Administered 2023-08-25: 650 mg via ORAL
  Filled 2023-08-25: qty 2

## 2023-08-25 MED ORDER — ALBUTEROL SULFATE (2.5 MG/3ML) 0.083% IN NEBU
2.5000 mg | INHALATION_SOLUTION | Freq: Once | RESPIRATORY_TRACT | Status: AC
  Administered 2023-08-25: 2.5 mg via RESPIRATORY_TRACT
  Filled 2023-08-25: qty 3

## 2023-08-25 NOTE — ED Notes (Signed)
 Rocephin  stopped at this time. Azithromycin  resumed after IV flush. No signs of allergic reaction, phlebitis or worsening of irritation.

## 2023-08-25 NOTE — ED Notes (Signed)
 IV site assessed, no signs of phlebitis, allergic reaction noted. Good blood return and saline flush noted. EDP notified. EDP approved administration of IV abx, Rocephin  going at this time. Will admin azithromycin  after completion of rocephin .

## 2023-08-25 NOTE — H&P (Signed)
 TRH H&P    Patient Demographics:    Alexandria [name], is a 78 y.o. female  MRN: [id number]  DOB - Oct 28, 1945  Admit Date - 08/25/2023    Outpatient Primary MD for the patient is Crecencio Dodge, Candida Chalk, DO  Patient coming from: [facility]  Chief complaint-shortness of breath   HPI:    Alexandria [name]  is a 78 y.o. female, with history of idiopathic pulmonary fibrosis, hypertension, atrial tachycardia, diastolic dysfunction came to ED with complaints of dyspnea on exertion.  Patient recently had rhinoplasty done at [facility] and says that since that procedure patient has noted worsening dyspnea on exertion.  She also had a temperature of 101 yesterday.  Has been coughing up clear phlegm. Denies chest pain.  Denies dysuria No nausea vomiting or diarrhea Denies abdominal pain Patient has remote history of DVT but not on any anticoagulation. In the ED CTA chest was done which was negative for pulmonary embolism but showed possible pneumonia. BNP elevated at 718. Patient started on ceftriaxone  and Zithromax  in the ED.     Review of systems:    In addition to the HPI above,    All other systems reviewed and are negative.    Past History of the following :    Past Medical History:  Diagnosis Date   Arthritis    Bronchial pneumonia    Chronic facial pain 08/14/2022   Colon polyp    Diverticulitis    DVT (deep venous thrombosis) (HCC)    lower extremity   H/O cardiac catheterization 2004   Normal coronary arteries   Heart murmur    History of stress test 05/21/2011   Hx of echocardiogram 01/27/2010   Normal Ef 55% the transmitral spectral doppler flow pattern is normal for age. the left ventricular wall motion is normal   Hypothyroidism    IPF (idiopathic pulmonary fibrosis) (HCC) 01/2018   Myocardial infarction (HCC)    hx of 30 years ago   Pancreatitis    TIA (transient  ischemic attack)    8 years ago      Past Surgical History:  Procedure Laterality Date   BREAST BIOPSY Left    [place]   CARDIAC CATHETERIZATION     10/2013   CATARACT EXTRACTION Bilateral 03/22/2018   FLEXIBLE SIGMOIDOSCOPY N/A 08/13/2022   Procedure: FLEXIBLE SIGMOIDOSCOPY;  Surgeon: Candyce Champagne, MD;  Location: [facility];  Service: General;  Laterality: N/A;   ILEOSTOMY CLOSURE N/A 11/19/2022   Procedure: OPEN TAKEDOWN OF LOOP ILEOSTOMY;  Surgeon: Candyce Champagne, MD;  Location: [facility];  Service: General;  Laterality: N/A;   LEFT HEART CATHETERIZATION WITH CORONARY ANGIOGRAM N/A 10/25/2013   Procedure: LEFT HEART CATHETERIZATION WITH CORONARY ANGIOGRAM;  Surgeon: Millicent Ally, MD;  Location: [facility] CATH LAB;  Service: Cardiovascular;  Laterality: N/A;   LYSIS OF ADHESION N/A 08/13/2022   Procedure: LYSIS OF ADHESION, DIVERTING ILEOSTOMY;  Surgeon: Candyce Champagne, MD;  Location: [facility];  Service: General;  Laterality: N/A;   NECK SURGERY     ACDF x  2      done in [place] more than 25 years, doesn't know levels   TOTAL KNEE ARTHROPLASTY     Bilateral x's 2   VAGINAL HYSTERECTOMY  10/17/1998   Ron Andree Kayser   VIDEO BRONCHOSCOPY Bilateral 01/24/2018   Procedure: VIDEO BRONCHOSCOPY WITH FLUORO;  Surgeon: Marine Sia, MD;  Location: [facility] ENDOSCOPY;  Service: Cardiopulmonary;  Laterality: Bilateral;   XI ROBOTIC ASSISTED COLOSTOMY TAKEDOWN N/A 08/13/2022   Procedure: ROBOTIC OSTOMY TAKEDOWN, SMALL BOWEL RESECTION X 2, BILATERAL TAP BLOCK, TISSUE PERFUSSION ASSESMENT VIA FIREFLY, MOBILAZATION OF SPLENIC FLEXURE;  Surgeon: Candyce Champagne, MD;  Location: [facility];  Service: General;  Laterality: N/A;   XI ROBOTIC ASSISTED LOWER ANTERIOR RESECTION N/A 03/31/2022   Procedure: XI ROBOTIC ASSISTED LOWER ANTERIOR RESECTION;  Surgeon: Candyce Champagne, MD;  Location: [facility];  Service: General;  Laterality: N/A;      Social History:      Social History   Tobacco Use   Smoking status: Never   Smokeless  tobacco: Never  Substance Use Topics   Alcohol  use: No    Alcohol /week: 0.0 standard drinks of alcohol        Family History :     Family History  Problem Relation Age of Onset   Ovarian cancer Mother    Uterine cancer Mother    Lung cancer Father    HIV Brother        1982   Kidney cancer Brother    Lung cancer Brother    Other Brother        Mouth Cancer   Colon cancer Maternal Aunt    Heart disease Maternal Grandmother    Stroke Maternal Grandmother    Hypertension Maternal Grandmother    Diabetes Maternal Grandmother    Arthritis Other    Esophageal cancer Neg Hx    Liver disease Neg Hx    Rectal cancer Neg Hx    Stomach cancer Neg Hx       Home Medications:   Prior to Admission medications   Medication Sig Start Date End Date Taking? Authorizing Provider  albuterol  (VENTOLIN  HFA) 108 (90 Base) MCG/ACT inhaler Inhale 2 puffs into the lungs every 6 (six) hours as needed (coughing). 08/05/23  Yes Trenton Frock, PA-C  amoxicillin  (AMOXIL ) 500 MG capsule Take 2,000 mg by mouth as directed. Take 1 hour prior to dental appointment. 06/21/23  Yes [provider]  amoxicillin -clavulanate (AUGMENTIN ) 875-125 MG tablet Take 1 tablet by mouth 2 (two) times daily. 08/24/23  Yes Estill Hemming, DO  aspirin  EC 81 MG tablet Take 81 mg by mouth 3 (three) times a week. Swallow whole.   Yes [provider]  benzonatate  (TESSALON ) 200 MG capsule Take 1 capsule (200 mg total) by mouth 3 (three) times daily as needed for cough. 06/18/23  Yes Lowne Chase, Yvonne R, DO  diphenoxylate-atropine (LOMOTIL) 2.5-0.025 MG tablet Take 2 tablets by mouth 3 (three) times daily as needed for diarrhea or loose stools. 07/07/23  Yes [provider]  folic acid  (FOLVITE ) 400 MCG tablet Take 1 tablet by mouth every other day. 08/13/09  Yes [provider]  furosemide  (LASIX ) 20 MG tablet Take 1 tablet (20 mg total) by mouth daily. Patient taking differently: Take 20 mg  by mouth daily as needed for edema or fluid. 02/05/23  Yes Roel Clarity R, DO  levothyroxine  (SYNTHROID ) 112 MCG tablet Take 112 mcg by mouth every other day. Take 125 mcg tablet on opposite  days.   Yes [provider]  levothyroxine  (SYNTHROID ) 125 MCG tablet 1 PO MWF and 1/2 tab all other days Patient taking differently: Take 125 mcg by mouth every other day. Take 112 mcg tablet on opposite days. 07/06/23  Yes Roel Clarity R, DO  losartan  (COZAAR ) 50 MG tablet Take 50 mg by mouth daily.   Yes [provider]  Multiple Vitamin (MULTIVITAMIN WITH MINERALS) TABS tablet Take 1 tablet by mouth once a week.   Yes [provider]  mupirocin ointment (BACTROBAN) 2 % Apply 1 Application topically 2 (two) times daily. 08/09/23  Yes [provider]  OFEV  100 MG CAPS Take 100 mg by mouth 2 (two) times daily. 06/19/22  Yes [provider]  ondansetron  (ZOFRAN -ODT) 4 MG disintegrating tablet TAKE 1 TABLET BY MOUTH EVERY 8 HOURS AS NEEDED FOR NAUSEA AND VOMITING Patient taking differently: Take 4 mg by mouth as needed for nausea or vomiting. 01/04/23  Yes Webb, Padonda B, FNP  promethazine -dextromethorphan (PROMETHAZINE -DM) 6.25-15 MG/5ML syrup Take 5 mLs by mouth 4 (four) times daily as needed for cough. 08/07/23  Yes Roel Clarity R, DO  vitamin C  (ASCORBIC ACID ) 500 MG tablet Take 500 mg by mouth 2 (two) times a week.   Yes [provider]  azithromycin  (ZITHROMAX  Z-PAK) 250 MG tablet As directed Patient not taking: Reported on 08/25/2023 08/24/23   Lowne Chase, Yvonne R, DO  ipratropium-albuterol  (DUONEB) 0.5-2.5 (3) MG/3ML SOLN Take 3 mLs by nebulization every 6 (six) hours as needed. Patient not taking: Reported on 08/25/2023 08/24/23   Roel Clarity R, DO  magnesium  oxide (MAG-OX) 400 MG tablet Take 1 tablet (400 mg total) by mouth daily. Patient not taking: Reported on 08/25/2023 06/23/23   Lamond Pilot, PA     Allergies:      Allergies  Allergen Reactions   Prednisone  Other (See Comments)    Changed personality    Dilaudid  [Hydromorphone ] Itching and Other (See Comments)    Dilaudid  caused marked confusion   Ipratropium Bromide  Itching and Other (See Comments)    Mental Status Changes, Insomnia  Insomnia - "Atrovent ."   Atrovent  Hfa [Ipratropium Bromide  Hfa] Itching and Other (See Comments)    Pt could not sleep, Insomnia    Cheratussin Ac [Guaifenesin -Codeine ] Other (See Comments)    Headaches    Hydrocodone Itching   Influenza Vaccines Other (See Comments)    Pt reports heart attack after last flu shot   Morphine  Itching   Oxycodone  Itching   Codeine  Itching, Nausea Only and Other (See Comments)    Pt takes promethazine  with codeine  at home   Diltiazem  Itching and Other (See Comments)    Makes patient sick. Shakes. GI   Ibuprofen  Itching and Other (See Comments)    "Gives false reading in blood"     Physical Exam:   Vitals  Blood pressure (!) 186/75, pulse 69, temperature 98.1 F (36.7 C), temperature source Oral, resp. rate (!) 24, height 5\' 4"  (1.626 m), weight 62.6 kg, SpO2 91%.  1.  General: Appears in no acute distress  2. Psychiatric: Alert, oriented x 3, intact insight and judgment  3. Neurologic: Cranial nerves II through XII grossly intact, no focal deficit noted  4. HEENMT:  Atraumatic normocephalic, extraocular muscles are intact  5. Respiratory : Bibasilar crackles auscultated  6. Cardiovascular : S1-S2, regular, no murmur auscultated  7. Gastrointestinal:  Abdomen is soft, nontender, no organomegaly  8. Skin:  No rashes noted  9.Musculoskeletal:  Trace edema in the lower extremities    Data Review:    CBC Recent Labs  Lab 08/25/23 0644  WBC 7.7  HGB 12.3  HCT 37.3  PLT 268  MCV 96.4  MCH 31.8  MCHC 33.0  RDW 14.3  LYMPHSABS 1.2  MONOABS 1.0  EOSABS 0.8*  BASOSABS 0.1    ------------------------------------------------------------------------------------------------------------------  Results for orders placed or performed during the hospital encounter of 08/25/23 (from the past 48 hours)  CBC with Differential     Status: Abnormal   Collection Time: 08/25/23  6:44 AM  Result Value Ref Range   WBC 7.7 4.0 - 10.5 K/uL   RBC 3.87 3.87 - 5.11 MIL/uL   Hemoglobin 12.3 12.0 - 15.0 g/dL   HCT 16.1 09.6 - 04.5 %   MCV 96.4 80.0 - 100.0 fL   MCH 31.8 26.0 - 34.0 pg   MCHC 33.0 30.0 - 36.0 g/dL   RDW 40.9 81.1 - 91.4 %   Platelets 268 150 - 400 K/uL   nRBC 0.0 0.0 - 0.2 %   Neutrophils Relative % 58 %   Neutro Abs 4.5 1.7 - 7.7 K/uL   Lymphocytes Relative 16 %   Lymphs Abs 1.2 0.7 - 4.0 K/uL   Monocytes Relative 14 %   Monocytes Absolute 1.0 0.1 - 1.0 K/uL   Eosinophils Relative 10 %   Eosinophils Absolute 0.8 (H) 0.0 - 0.5 K/uL   Basophils Relative 1 %   Basophils Absolute 0.1 0.0 - 0.1 K/uL   Immature Granulocytes 1 %   Abs Immature Granulocytes 0.04 0.00 - 0.07 K/uL    Comment: Performed at [facility], [address] [place], [place] [address]  Basic metabolic panel     Status: Abnormal   Collection Time: 08/25/23  6:44 AM  Result Value Ref Range   Sodium 141 135 - 145 mmol/L   Potassium 3.6 3.5 - 5.1 mmol/L   Chloride 104 98 - 111 mmol/L   CO2 29 22 - 32 mmol/L   Glucose, Bld 112 (H) 70 - 99 mg/dL    Comment: Glucose reference range applies only to samples taken after fasting for at least 8 hours.   BUN 12 8 - 23 mg/dL   Creatinine, Ser 6.21 0.44 - 1.00 mg/dL   Calcium  9.5 8.9 - 10.3 mg/dL   GFR, Estimated >30 >86 mL/[name]    Comment: (NOTE) Calculated using the CKD-EPI Creatinine Equation (2021)    Anion gap 8 5 - 15    Comment: Performed at [facility], [address] [place], [place] [address]  Troponin T, High Sensitivity     Status: Abnormal   Collection Time: 08/25/23  6:44 AM  Result Value  Ref Range   Troponin T High Sensitivity 23 (H) <19 ng/L    Comment: (NOTE) Biotin  concentrations > 1000 ng/mL falsely decrease TnT results.  Serial cardiac troponin measurements are suggested.  Refer to the Links section for chest pain algorithms and additional  guidance. Performed at [facility], [address] [place], [place] [address]   Procalcitonin     Status: None   Collection Time: 08/25/23  6:44 AM  Result Value Ref Range   Procalcitonin <0.10 ng/mL    Comment:        Interpretation: PCT (Procalcitonin) <= 0.5 ng/mL: Systemic infection (sepsis) is not likely. Local bacterial infection is possible. (NOTE)       Sepsis PCT Algorithm  Lower Respiratory Tract                                      Infection PCT Algorithm    ----------------------------     ----------------------------         PCT < 0.25 ng/mL                PCT < 0.10 ng/mL          Strongly encourage             Strongly discourage   discontinuation of antibiotics    initiation of antibiotics    ----------------------------     -----------------------------       PCT 0.25 - 0.50 ng/mL            PCT 0.10 - 0.25 ng/mL               OR       >80% decrease in PCT            Discourage initiation of                                            antibiotics      Encourage discontinuation           of antibiotics    ----------------------------     -----------------------------         PCT >= 0.50 ng/mL              PCT 0.26 - 0.50 ng/mL               AND        <80% decrease in PCT             Encourage initiation of                                             antibiotics       Encourage continuation           of antibiotics    ----------------------------     -----------------------------        PCT >= 0.50 ng/mL                  PCT > 0.50 ng/mL               AND         increase in PCT                  Strongly encourage                                      initiation of  antibiotics    Strongly encourage escalation           of antibiotics                                     -----------------------------  PCT <= 0.25 ng/mL                                                 OR                                        > 80% decrease in PCT                                      Discontinue / Do not initiate                                             antibiotics  Performed at [facility], [address], [place], [place] [address]   Pro Brain natriuretic peptide     Status: Abnormal   Collection Time: 08/25/23  6:44 AM  Result Value Ref Range   Pro Brain Natriuretic Peptide 713.0 (H) <300.0 pg/mL    Comment: (NOTE) Age Group        Cut-Points    Interpretation  < 50 years     450 pg/mL       NT-proBNP > 450 pg/mL indicates                                ADHF is likely              50 to 75 years  900 pg/mL      NT-proBNP > 900 pg/mL indicates          ADHF is likely  > 75 years      1800 pg/mL     NT-proBNP > 1800 pg/mL indicates          ADHF is likely                           All ages    Results between       Indeterminate. Further clinical             300 and the cut-   information is needed to determine            point for age group   if ADHF is present.                                                             Elecsys proBNP II/ Elecsys proBNP II STAT           Cut-Point                       Interpretation  300 pg/mL                    NT-proBNP <300pg/mL indicates  ADHF is not likely  Performed at [facility], [address] [place], [place] [address]   Troponin T, High Sensitivity     Status: None   Collection Time: 08/25/23  8:44 AM  Result Value Ref Range   Troponin T High Sensitivity 15 <19 ng/L    Comment: (NOTE) Biotin  concentrations > 1000 ng/mL falsely decrease TnT results.  Serial cardiac troponin measurements are suggested.  Refer  to the Links section for chest pain algorithms and additional  guidance. Performed at [facility], [address] [place], [place] [address]    *Note: Due to a large number of results and/or encounters for the requested time period, some results have not been displayed. A complete set of results can be found in Results Review.    Chemistries  Recent Labs  Lab 08/25/23 0644  NA 141  K 3.6  CL 104  CO2 29  GLUCOSE 112*  BUN 12  CREATININE 0.73  CALCIUM  9.5   ------------------------------------------------------------------------------------------------------------------  ------------------------------------------------------------------------------------------------------------------ GFR: Estimated Creatinine Clearance: 50 mL/[name] (by C-G formula based on SCr of 0.73 mg/dL). Liver Function Tests: No results for input(s): "AST", "ALT", "ALKPHOS", "BILITOT", "PROT", "ALBUMIN " in the last 168 hours. No results for input(s): "LIPASE", "AMYLASE" in the last 168 hours. No results for input(s): "AMMONIA" in the last 168 hours. Coagulation Profile: No results for input(s): "INR", "PROTIME" in the last 168 hours. Cardiac Enzymes: No results for input(s): "CKTOTAL", "CKMB", "CKMBINDEX", "TROPONINI" in the last 168 hours. BNP (last 3 results) Recent Labs    08/25/23 0644  PROBNP 713.0*   HbA1C: No results for input(s): "HGBA1C" in the last 72 hours. CBG: No results for input(s): "GLUCAP" in the last 168 hours. Lipid Profile: No results for input(s): "CHOL", "HDL", "LDLCALC", "TRIG", "CHOLHDL", "LDLDIRECT" in the last 72 hours. Thyroid  Function Tests: No results for input(s): "TSH", "T4TOTAL", "FREET4", "T3FREE", "THYROIDAB" in the last 72 hours. Anemia Panel: No results for input(s): "VITAMINB12", "FOLATE", "FERRITIN", "TIBC", "IRON ", "RETICCTPCT" in the last 72  hours.  --------------------------------------------------------------------------------------------------------------- Urine analysis:    Component Value Date/Time   COLORURINE YELLOW 08/28/2022 1651   APPEARANCEUR CLEAR 08/28/2022 1651   LABSPEC 1.015 08/28/2022 1651   PHURINE 5.5 08/28/2022 1651   GLUCOSEU NEGATIVE 08/28/2022 1651   HGBUR NEGATIVE 08/28/2022 1651   BILIRUBINUR small 04/23/2023 1437   KETONESUR NEGATIVE 08/28/2022 1651   PROTEINUR Negative 04/23/2023 1437   PROTEINUR NEGATIVE 08/28/2022 1651   UROBILINOGEN 0.2 04/23/2023 1437   NITRITE Negative 04/23/2023 1437   NITRITE NEGATIVE 08/28/2022 1651   LEUKOCYTESUR Trace (A) 04/23/2023 1437   LEUKOCYTESUR NEGATIVE 08/28/2022 1651      Imaging Results:    CT Angio Chest PE W and/or Wo Contrast Result Date: 08/25/2023 CLINICAL DATA:  Pulmonary embolism (PE) suspected, high prob, dyspnea, IPF EXAM: CT ANGIOGRAPHY CHEST WITH CONTRAST TECHNIQUE: Multidetector CT imaging of the chest was performed using the standard protocol during bolus administration of intravenous contrast. Multiplanar CT image reconstructions and MIPs were obtained to evaluate the vascular anatomy. RADIATION DOSE REDUCTION: This exam was performed according to the departmental dose-optimization program which includes automated exposure control, adjustment of the mA and/or kV according to patient size and/or use of iterative reconstruction technique. CONTRAST:  75mL OMNIPAQUE  IOHEXOL  350 MG/ML SOLN COMPARISON:  May 18, 2023 FINDINGS: Pulmonary Embolism: No pulmonary embolism. Abnormal dilation of the main pulmonary artery, consistent with pulmonary arterial hypertension. Cardiovascular: Mild cardiomegaly with a right atrial predominance. Ectasia of the ascending aorta measuring  3.9 cm. No aneurysm. No aortic dissection. Diffuse calcified aortic atherosclerosis. Mediastinum/Nodes: No mediastinal mass. A few mildly enlarged AP window and subcarinal lymph nodes  present measuring up to 1.2 cm. These are likely reactive. No axillary or hilar lymphadenopathy. Lungs/Pleura: The trachea is midline and patent. Fairly diffuse traction bronchiectasis, most notably in the left upper lobe/lingula, and right lower lobe. Subpleural reticulation with honeycombing in the right lower lobe and lingula. Superimposed ground-glass airspace disease now present in the left upper lobe and lingula and both lower lobes. Trace left pleural effusion. No pneumothorax. Musculoskeletal: No acute fracture or destructive bone lesion. Partially visualized cervical fusion hardware. Right glenohumeral joint osteoarthritis. Diffuse osteopenia. Multilevel degenerative disc disease of the spine. Upper Abdomen: No acute abnormality in the partially visualized upper abdomen. Review of the MIP images confirms the above findings. IMPRESSION: 1. Redemonstrated findings consistent with UIP pattern pulmonary fibrosis. Superimposed ground-glass airspace opacities have developed in the interim predominantly in the left upper lobe, lingula, and both lower lobes, worrisome for a multifocal pneumonia. Trace left pleural effusion. 2. The pulmonary artery remains dilated, consistent with pulmonary arterial hypertension. No pulmonary embolism or aortic dissection. 3. Mildly enlarged AP and subcarinal lymph nodes, likely reactive. Aortic Atherosclerosis (ICD10-I70.0). Electronically Signed   By: Rance Burrows M.D.   On: 08/25/2023 10:11   DG Chest 2 View Result Date: 08/24/2023 CLINICAL DATA:  Low oxygen breathing difficulty recent rhinoplasty EXAM: CHEST - 2 VIEW COMPARISON:  06/28/2023, CT chest 05/18/2023, chest x-ray 02/12/2023 FINDINGS: Diffuse reticular opacity corresponding to history of chronic interstitial fibrosis. No pleural effusion. Difficult to exclude mild superimposed ground-glass infiltrate in the left mid to lower lung. Stable cardiomediastinal silhouette with aortic atherosclerosis. No pneumothorax.  IMPRESSION: Diffuse reticular opacity corresponding to history of chronic interstitial fibrosis. Difficult to exclude mild superimposed ground-glass infiltrate in the left mid to lower lung. Electronically Signed   By: Esmeralda Hedge M.D.   On: 08/24/2023 17:28    My personal review of EKG: Rhythm NSR,    Assessment & Plan:    Principal Problem:   CAP (community acquired pneumonia)   Acute hypoxemic respiratory failure-patient requiring 2 L/[name] of oxygen.  Likely from underlying community-acquired pneumonia seen on CT chest.  Also has elevated BNP.  Started on ceftriaxone  and Zithromax .  Will give 1 dose of Lasix  20 mg IV.  Wean off oxygen as tolerated.  Start albuterol  nebulizer every 6 hours as needed.  Will start Mucinex  600 mg p.o. twice daily.  Obtain blood cultures x 2.  Check procalcitonin.  Acute on chronic diastolic CHF-BNP elevated 718.  Lasix  20 mg IV given as above.  Follow BMP in AM.  Recent rhinoplasty-start Mucinex  600 mg p.o. twice daily.  Continue saline nasal spray as needed.  Hypertension-patient takes Cozaar  at home.  Will continue with Cozaar .  Start hydralazine  as needed.  History of idiopathic pulmonary fibrosis-not on prednisone  at home.    DVT Prophylaxis-   Lovenox    AM Labs Ordered, also please review Full Orders  Family Communication: Admission, patients condition and plan of care including tests being ordered have been discussed with the patient who indicate understanding and agree with the plan and Code Status.  Code Status:  Full code  Admission status: Inpatient :The appropriate admission status for this patient is INPATIENT. Inpatient status is judged to be reasonable and necessary in order to provide the required intensity of service to ensure the patient's safety. The patient's presenting symptoms, physical exam findings, and initial radiographic  and laboratory data in the context of their chronic comorbidities is felt to place them at high risk for  further clinical deterioration. Furthermore, it is not anticipated that the patient will be medically stable for discharge from the hospital within 2 midnights of admission. The following factors support the admission status of inpatient.      Time spent in minutes : 60 [name]   Ersilia Brawley S Jonay Hitchcock M.D

## 2023-08-25 NOTE — ED Notes (Signed)
 Pt unable to tolerate ambulation w O2 per EDP verbal order. Pt made half lap around ED unit, started coughing. Pt sat down in wheelchair, wheeled to room. O2 sat dropped to 84%. Pt refused nasal cannula, refused face mask for O2. Pt agreed to hold face mask close to face at 4L O2 until O2 sat rose to 94% to which pt dropped face mask and stated "I'm done"  Baseline O2 prior to ambulation 95 Baseline HR prior to ambulation 77

## 2023-08-25 NOTE — ED Provider Notes (Signed)
 [facility] Provider Note   CSN: [id number] Arrival date & time: 08/25/23  4098     History  Chief Complaint  Patient presents with   Shortness of Breath    Alexandria [name] is a 78 y.o. female.  78 year old female with past medical history of hypertension and idiopathic pulmonary fibrosis presenting to the emergency department today with shortness of breath.  The patient states that she saw her doctor yesterday and there was questionable pneumonia on her x-rays.  She was darted on antibiotics.  Reports that her shortness of breath has worsened.  She has had minimal cough with this.  Denies any fevers.  She denies any associated chest pain.  She does report that she had a rhinoplasty 2 weeks ago.  She was under general anesthesia for this.  Denies any leg pain or swelling.  She came to the ER today for further evaluation.  The patient does report a remote history of DVT but is not on any anticoagulation currently.   Shortness of Breath      Home Medications Prior to Admission medications   Medication Sig Start Date End Date Taking? Authorizing Provider  albuterol  (VENTOLIN  HFA) 108 (90 Base) MCG/ACT inhaler Inhale 2 puffs into the lungs every 6 (six) hours as needed (coughing). 08/05/23   Trenton Frock, PA-C  amoxicillin -clavulanate (AUGMENTIN ) 875-125 MG tablet Take 1 tablet by mouth 2 (two) times daily. 08/24/23   Lowne Chase, Yvonne R, DO  aspirin  EC 81 MG tablet Take 81 mg by mouth 3 (three) times a week. Swallow whole.    [provider]  azithromycin  (ZITHROMAX  Z-PAK) 250 MG tablet As directed 08/24/23   Crecencio Dodge, Yvonne R, DO  benzonatate  (TESSALON ) 200 MG capsule Take 1 capsule (200 mg total) by mouth 3 (three) times daily as needed for cough. 06/18/23   Lowne Chase, Yvonne R, DO  clopidogrel  (PLAVIX ) 75 MG tablet Take 1 tablet by mouth every other day. 08/13/09   [provider]  diphenoxylate-atropine  (LOMOTIL) 2.5-0.025 MG tablet Take 2 tablets by mouth 3 (three) times daily as needed for diarrhea or loose stools. 07/07/23   [provider]  folic acid  (FOLVITE ) 400 MCG tablet Take 1 tablet by mouth every other day. 08/13/09   [provider]  furosemide  (LASIX ) 20 MG tablet Take 1 tablet (20 mg total) by mouth daily. Patient taking differently: Take 20 mg by mouth daily as needed for edema or fluid. 02/05/23   Estill Hemming, DO  ipratropium-albuterol  (DUONEB) 0.5-2.5 (3) MG/3ML SOLN Take 3 mLs by nebulization every 6 (six) hours as needed. 08/24/23   Lowne Chase, Yvonne R, DO  levothyroxine  (SYNTHROID ) 112 MCG tablet Take 112 mcg by mouth every other day.    [provider]  levothyroxine  (SYNTHROID ) 125 MCG tablet 1 PO MWF and 1/2 tab all other days 07/06/23   Crecencio Dodge, Candida Chalk, DO  losartan  (COZAAR ) 25 MG tablet Take 1 tablet (25 mg total) by mouth daily. 06/23/23   D[facility], Warren Haber, PA  magnesium  oxide (MAG-OX) 400 MG tablet Take 1 tablet (400 mg total) by mouth daily. 06/23/23   [facility], Warren Haber, PA  Multiple Vitamin (MULTIVITAMIN WITH MINERALS) TABS tablet Take 1 tablet by mouth once a week.    [provider]  OFEV  100 MG CAPS Take 100 mg by mouth 2 (two) times daily. 06/19/22   [provider]  ondansetron  (ZOFRAN -ODT) 4 MG disintegrating tablet TAKE 1  TABLET BY MOUTH EVERY 8 HOURS AS NEEDED FOR NAUSEA AND VOMITING 01/04/23   Webb, Padonda B, FNP  promethazine -dextromethorphan (PROMETHAZINE -DM) 6.25-15 MG/5ML syrup Take 5 mLs by mouth 4 (four) times daily as needed for cough. 08/07/23   Estill Hemming, DO  vitamin C  (ASCORBIC ACID ) 500 MG tablet Take 500 mg by mouth 2 (two) times a week.    [provider]      Allergies    Prednisone , Dilaudid  [hydromorphone ], Ipratropium bromide , Atrovent  hfa [ipratropium bromide  hfa], Cheratussin ac [guaifenesin -codeine ], Diltiazem , Hydrocodone, Influenza vaccines, Morphine , Oxycodone ,  Codeine , and Ibuprofen     Review of Systems   Review of Systems  Respiratory:  Positive for shortness of breath.   All other systems reviewed and are negative.   Physical Exam Updated Vital Signs BP (!) 187/83   Pulse 77   Temp 98.2 F (36.8 C) (Oral)   Resp 16   Ht 5\' 4"  (1.626 m)   Wt 62.6 kg   SpO2 96%   BMI 23.69 kg/m  Physical Exam Vitals and nursing note reviewed.   Gen: Conversational dyspnea noted, mild tachypnea noted Eyes: PERRL, EOMI HEENT: no oropharyngeal swelling Neck: trachea midline Resp: Coarse breath sounds noted at lower lung fields Card: RRR, no murmurs, rubs, or gallops Abd: nontender, nondistended Extremities: no calf tenderness, no edema Vascular: 2+ radial pulses bilaterally, 2+ DP pulses bilaterally Skin: no rashes Psyc: acting appropriately   ED Results / Procedures / Treatments   Labs (all labs ordered are listed, but only abnormal results are displayed) Labs Reviewed  CBC WITH DIFFERENTIAL/PLATELET - Abnormal; Notable for the following components:      Result Value   Eosinophils Absolute 0.8 (*)    All other components within normal limits  BASIC METABOLIC PANEL WITH GFR - Abnormal; Notable for the following components:   Glucose, Bld 112 (*)    All other components within normal limits  TROPONIN T, HIGH SENSITIVITY - Abnormal; Notable for the following components:   Troponin T High Sensitivity 23 (*)    All other components within normal limits  RESP PANEL BY RT-PCR (RSV, FLU A&B, COVID)  [id number]  TROPONIN T, HIGH SENSITIVITY    EKG None  Radiology CT Angio Chest PE W and/or Wo Contrast Result Date: 08/25/2023 CLINICAL DATA:  Pulmonary embolism (PE) suspected, high prob, dyspnea, IPF EXAM: CT ANGIOGRAPHY CHEST WITH CONTRAST TECHNIQUE: Multidetector CT imaging of the chest was performed using the standard protocol during bolus administration of intravenous contrast. Multiplanar CT image reconstructions and MIPs were obtained to  evaluate the vascular anatomy. RADIATION DOSE REDUCTION: This exam was performed according to the departmental dose-optimization program which includes automated exposure control, adjustment of the mA and/or kV according to patient size and/or use of iterative reconstruction technique. CONTRAST:  75mL OMNIPAQUE  IOHEXOL  350 MG/ML SOLN COMPARISON:  May 18, 2023 FINDINGS: Pulmonary Embolism: No pulmonary embolism. Abnormal dilation of the main pulmonary artery, consistent with pulmonary arterial hypertension. Cardiovascular: Mild cardiomegaly with a right atrial predominance. Ectasia of the ascending aorta measuring 3.9 cm. No aneurysm. No aortic dissection. Diffuse calcified aortic atherosclerosis. Mediastinum/Nodes: No mediastinal mass. A few mildly enlarged AP window and subcarinal lymph nodes present measuring up to 1.2 cm. These are likely reactive. No axillary or hilar lymphadenopathy. Lungs/Pleura: The trachea is midline and patent. Fairly diffuse traction bronchiectasis, most notably in the left upper lobe/lingula, and right lower lobe. Subpleural reticulation with honeycombing in the right lower lobe and lingula. Superimposed ground-glass airspace disease now present  in the left upper lobe and lingula and both lower lobes. Trace left pleural effusion. No pneumothorax. Musculoskeletal: No acute fracture or destructive bone lesion. Partially visualized cervical fusion hardware. Right glenohumeral joint osteoarthritis. Diffuse osteopenia. Multilevel degenerative disc disease of the spine. Upper Abdomen: No acute abnormality in the partially visualized upper abdomen. Review of the MIP images confirms the above findings. IMPRESSION: 1. Redemonstrated findings consistent with UIP pattern pulmonary fibrosis. Superimposed ground-glass airspace opacities have developed in the interim predominantly in the left upper lobe, lingula, and both lower lobes, worrisome for a multifocal pneumonia. Trace left pleural  effusion. 2. The pulmonary artery remains dilated, consistent with pulmonary arterial hypertension. No pulmonary embolism or aortic dissection. 3. Mildly enlarged AP and subcarinal lymph nodes, likely reactive. Aortic Atherosclerosis (ICD10-I70.0). Electronically Signed   By: Rance Burrows M.D.   On: 08/25/2023 10:11   DG Chest 2 View Result Date: 08/24/2023 CLINICAL DATA:  Low oxygen breathing difficulty recent rhinoplasty EXAM: CHEST - 2 VIEW COMPARISON:  06/28/2023, CT chest 05/18/2023, chest x-ray 02/12/2023 FINDINGS: Diffuse reticular opacity corresponding to history of chronic interstitial fibrosis. No pleural effusion. Difficult to exclude mild superimposed ground-glass infiltrate in the left mid to lower lung. Stable cardiomediastinal silhouette with aortic atherosclerosis. No pneumothorax. IMPRESSION: Diffuse reticular opacity corresponding to history of chronic interstitial fibrosis. Difficult to exclude mild superimposed ground-glass infiltrate in the left mid to lower lung. Electronically Signed   By: Esmeralda Hedge M.D.   On: 08/24/2023 17:28    Procedures Procedures    Medications Ordered in ED Medications  cefTRIAXone  (ROCEPHIN ) 2 g in sodium chloride  0.9 % 100 mL IVPB (has no administration in time range)  azithromycin  (ZITHROMAX ) 500 mg in sodium chloride  0.9 % 250 mL IVPB (has no administration in time range)  albuterol  (PROVENTIL ) (2.5 MG/3ML) 0.083% nebulizer solution 2.5 mg (2.5 mg Nebulization Given 08/25/23 0735)  iohexol  (OMNIPAQUE ) 350 MG/ML injection 75 mL (75 mLs Intravenous Contrast Given 08/25/23 0830)    ED Course/ Medical Decision Making/ A&P                                 Medical Decision Making 78 year old female with past medical history of hypertension and pulmonary fibrosis presenting to the emergency department today with shortness of breath.  I will further evaluate the patient here with basic lab as well as an EKG and troponin to eval for atypical ACS.   Will obtain a CT angiogram to evaluate for pulmonary embolism, pneumonia, or pulmonary edema as her x-ray yesterday was essentially equivocal.  I will give the patient a DuoNeb here to see if this helps with her symptoms.  Will reevaluate but she will likely require admission.  The patient's CT scan does show pneumonia but no pulmonary embolism.  Her ambulatory pulse ox 84% on room air.  She is placed on nasal cannula oxygen.  She is given Rocephin  and azithromycin  and a call was placed to hospitalist service for admission.  Amount and/or Complexity of Data Reviewed Radiology: ordered.  Risk Prescription drug management. Decision regarding hospitalization.           Final Clinical Impression(s) / ED Diagnoses Final diagnoses:  Pneumonia due to infectious organism, unspecified laterality, unspecified part of lung  Hypoxia    Rx / DC Orders ED Discharge Orders     None         Carin Charleston, MD 08/25/23 1234

## 2023-08-25 NOTE — ED Notes (Addendum)
 Pt stated the azithromycin  was "burning at IV site" medication stopped at this time. No signs of infiltration or irritation at IV site. Pt refused new IV insertion. Rocephin  started after flushing IV

## 2023-08-25 NOTE — Telephone Encounter (Signed)
 Pharmacy Patient Advocate Encounter   Received notification from CoverMyMeds that prior authorization for Ipratropium-Albuterol  0.5-2.5 (3)MG/3ML solution is required/requested.   Insurance verification completed.   The patient is insured through [facility] .   Per test claim: PA required; PA submitted to above mentioned insurance via CoverMyMeds Key/confirmation #/EOC [id number] Status is pending

## 2023-08-25 NOTE — Telephone Encounter (Signed)
 Pharmacy Patient Advocate Encounter  Received notification from [facility] that Prior Authorization for Ipratropium-Albuterol  0.5-2.5 (3)MG/3ML solution has been DENIED.  Full denial letter will be uploaded to the media tab. See denial reason below.    PA #/Case ID/Reference #: [id number]

## 2023-08-25 NOTE — ED Triage Notes (Signed)
 Sob X 2 days, seen at Dr. Jalene Mayor office and testing was done, including a CT. Pt had rhinoplasty done 2 weeks ago.

## 2023-08-25 NOTE — ED Notes (Signed)
 Unable to obtain respiratory swab due to pt recent rhinoplasty surgery, EDP notified

## 2023-08-25 NOTE — Plan of Care (Signed)
   Problem: Education: Goal: Knowledge of General Education information will improve Description Including pain rating scale, medication(s)/side effects and non-pharmacologic comfort measures Outcome: Progressing   Problem: Health Behavior/Discharge Planning: Goal: Ability to manage health-related needs will improve Outcome: Progressing

## 2023-08-25 NOTE — ED Notes (Signed)
Report given to carelink 

## 2023-08-26 DIAGNOSIS — J189 Pneumonia, unspecified organism: Secondary | ICD-10-CM | POA: Diagnosis not present

## 2023-08-26 DIAGNOSIS — R0902 Hypoxemia: Secondary | ICD-10-CM | POA: Diagnosis not present

## 2023-08-26 LAB — COMPREHENSIVE METABOLIC PANEL WITH GFR
ALT: 10 U/L (ref 0–44)
AST: 20 U/L (ref 15–41)
Albumin: 3.1 g/dL — ABNORMAL LOW (ref 3.5–5.0)
Alkaline Phosphatase: 81 U/L (ref 38–126)
Anion gap: 9 (ref 5–15)
BUN: 9 mg/dL (ref 8–23)
CO2: 25 mmol/L (ref 22–32)
Calcium: 8.9 mg/dL (ref 8.9–10.3)
Chloride: 105 mmol/L (ref 98–111)
Creatinine, Ser: 0.62 mg/dL (ref 0.44–1.00)
GFR, Estimated: >60 mL/min
Glucose, Bld: 110 mg/dL — ABNORMAL HIGH (ref 70–99)
Potassium: 4 mmol/L (ref 3.5–5.1)
Sodium: 139 mmol/L (ref 135–145)
Total Bilirubin: 0.8 mg/dL (ref 0.0–1.2)
Total Protein: 6.7 g/dL (ref 6.5–8.1)

## 2023-08-26 LAB — CBC
HCT: 36.7 % (ref 36.0–46.0)
Hemoglobin: 11.7 g/dL — ABNORMAL LOW (ref 12.0–15.0)
MCH: 32.1 pg (ref 26.0–34.0)
MCHC: 31.9 g/dL (ref 30.0–36.0)
MCV: 100.5 fL — ABNORMAL HIGH (ref 80.0–100.0)
Platelets: 252 10*3/uL (ref 150–400)
RBC: 3.65 MIL/uL — ABNORMAL LOW (ref 3.87–5.11)
RDW: 14.2 % (ref 11.5–15.5)
WBC: 5.9 10*3/uL (ref 4.0–10.5)
nRBC: 0 % (ref 0.0–0.2)

## 2023-08-26 MED ORDER — FUROSEMIDE 10 MG/ML IJ SOLN
20.0000 mg | Freq: Once | INTRAMUSCULAR | Status: AC
Start: 2023-08-26 — End: 2023-08-26
  Administered 2023-08-26: 20 mg via INTRAVENOUS
  Filled 2023-08-26: qty 2

## 2023-08-26 MED ORDER — HYDRALAZINE HCL 25 MG PO TABS
25.0000 mg | ORAL_TABLET | Freq: Four times a day (QID) | ORAL | Status: DC | PRN
  Administered 2023-08-26: 25 mg via ORAL
  Filled 2023-08-26: qty 1

## 2023-08-26 MED ORDER — ACETAMINOPHEN 325 MG PO TABS
650.0000 mg | ORAL_TABLET | Freq: Four times a day (QID) | ORAL | Status: DC | PRN
  Filled 2023-08-26: qty 2

## 2023-08-26 MED ORDER — LEVOTHYROXINE SODIUM 125 MCG PO TABS
125.0000 ug | ORAL_TABLET | ORAL | Status: DC
Start: 2023-08-27 — End: 2023-08-28
  Administered 2023-08-27: 125 ug via ORAL
  Filled 2023-08-26: qty 1

## 2023-08-26 MED ORDER — SODIUM CHLORIDE 0.9% FLUSH: 3.0000 mL | INTRAVENOUS | Status: DC | PRN

## 2023-08-26 MED ORDER — SODIUM CHLORIDE 0.9 % IV SOLN: 1.0000 g | INTRAVENOUS | Status: DC

## 2023-08-26 MED ORDER — LEVOTHYROXINE SODIUM 112 MCG PO TABS
112.0000 ug | ORAL_TABLET | ORAL | Status: DC
  Administered 2023-08-26 – 2023-08-28 (×2): 112 ug via ORAL
  Filled 2023-08-26 (×2): qty 1

## 2023-08-26 MED ORDER — AMLODIPINE BESYLATE 5 MG PO TABS
5.0000 mg | ORAL_TABLET | Freq: Every day | ORAL | Status: DC
  Administered 2023-08-26 – 2023-08-28 (×3): 5 mg via ORAL
  Filled 2023-08-26 (×3): qty 1

## 2023-08-26 MED ORDER — LEVALBUTEROL HCL 0.63 MG/3ML IN NEBU: 0.6300 mg | INHALATION_SOLUTION | Freq: Four times a day (QID) | RESPIRATORY_TRACT | Status: DC | PRN

## 2023-08-26 MED ORDER — SODIUM CHLORIDE 0.9 % IV SOLN: 250.0000 mL | INTRAVENOUS | Status: AC | PRN

## 2023-08-26 MED ORDER — SODIUM CHLORIDE 0.9% FLUSH
3.0000 mL | Freq: Two times a day (BID) | INTRAVENOUS | Status: DC
  Administered 2023-08-26 – 2023-08-28 (×5): 3 mL via INTRAVENOUS

## 2023-08-26 MED ORDER — FUROSEMIDE 10 MG/ML IJ SOLN
20.0000 mg | Freq: Once | INTRAMUSCULAR | Status: AC
  Administered 2023-08-26: 20 mg via INTRAVENOUS
  Filled 2023-08-26: qty 2

## 2023-08-26 MED ORDER — METHYLPREDNISOLONE SODIUM SUCC 40 MG IJ SOLR
40.0000 mg | Freq: Two times a day (BID) | INTRAMUSCULAR | Status: DC
Start: 2023-08-26 — End: 2023-08-28
  Administered 2023-08-26 – 2023-08-28 (×4): 40 mg via INTRAVENOUS
  Filled 2023-08-26 (×4): qty 1

## 2023-08-26 MED ORDER — GUAIFENESIN ER 600 MG PO TB12
600.0000 mg | ORAL_TABLET | Freq: Two times a day (BID) | ORAL | Status: DC
  Administered 2023-08-26 – 2023-08-28 (×5): 600 mg via ORAL
  Filled 2023-08-26 (×5): qty 1

## 2023-08-26 MED ORDER — ENOXAPARIN SODIUM 40 MG/0.4ML IJ SOSY
40.0000 mg | PREFILLED_SYRINGE | INTRAMUSCULAR | Status: DC
  Administered 2023-08-26 – 2023-08-28 (×3): 40 mg via SUBCUTANEOUS
  Filled 2023-08-26 (×3): qty 0.4

## 2023-08-26 MED ORDER — ALPRAZOLAM 0.5 MG PO TABS: 0.5000 mg | ORAL_TABLET | Freq: Three times a day (TID) | ORAL | Status: DC | PRN

## 2023-08-26 MED ORDER — ACETAMINOPHEN 650 MG RE SUPP: 650.0000 mg | Freq: Four times a day (QID) | RECTAL | Status: DC | PRN

## 2023-08-26 MED ORDER — SODIUM CHLORIDE 0.9 % IV SOLN: 500.0000 mg | INTRAVENOUS | Status: DC

## 2023-08-26 MED ORDER — SALINE SPRAY 0.65 % NA SOLN
1.0000 | NASAL | Status: DC | PRN
  Filled 2023-08-26: qty 44

## 2023-08-26 NOTE — Plan of Care (Signed)

## 2023-08-26 NOTE — TOC Initial Note (Signed)
 Transition of Care [facility]) - Initial/Assessment Note    Patient Details  Name: Alexandria [name] MRN: [id number] Date of Birth: 02-19-1946  [facility]) CM/SW Contact:    Kathryn Parish, RN Phone Number: 08/26/2023, 3:48 PM  Clinical Narrative:                 NCM spoke with with in room. Patient states PTA lives in a house with spouse Alexandria [name]; PCP/insurance verified; No DME, HH, oxygen or SDOH needs; Patient daughter or spouse will transport at discharge. TOC will follow progression to discharge for possible oxygen needs.  Expected Discharge Plan: Home/Self Care Barriers to Discharge: Continued Medical Work up   Patient Goals and CMS Choice Patient states their goals for this hospitalization and ongoing recovery are:: Home   Choice offered to / list presented to : NA [place] ownership interest in [facility].provided to:: Parent NA    Expected Discharge Plan and Services   Discharge Planning Services: CM Consult Post Acute Care Choice: NA Living arrangements for the past 2 months: Single Family Home                 DME Arranged: N/A DME Agency: NA       HH Arranged: NA HH Agency: NA        Prior Living Arrangements/Services Living arrangements for the past 2 months: Single Family Home Lives with:: Spouse Patient language and need for interpreter reviewed:: Yes Do you feel safe going back to the place where you live?: Yes      Need for Family Participation in Patient Care: Yes (Comment) Care giver support system in place?: Yes (comment) Current home services:  (NA) Criminal Activity/Legal Involvement Pertinent to Current Situation/Hospitalization: No - Comment as needed  Activities of Daily Living   ADL Screening (condition at time of admission) Independently performs ADLs?: Yes (appropriate for developmental age) Is the patient deaf or have difficulty hearing?: No Does the patient have difficulty seeing, even when wearing  glasses/contacts?: No Does the patient have difficulty concentrating, remembering, or making decisions?: No  Permission Sought/Granted Permission sought to share information with : Case Manager Permission granted to share information with : Yes, Verbal Permission Granted  Share Information with NAME: Case Manager           Emotional Assessment Appearance:: Appears stated age Attitude/Demeanor/Rapport: Gracious Affect (typically observed): Appropriate Orientation: : Oriented to Self, Oriented to Place, Oriented to  Time, Oriented to Situation Alcohol  / Substance Use: Not Applicable Psych Involvement: No (comment)  Admission diagnosis:  Hypoxia [R09.02] CAP (community acquired pneumonia) [J18.9] Pneumonia due to infectious organism, unspecified laterality, unspecified part of lung [J18.9] Patient Active Problem List   Diagnosis Date Noted   CAP (community acquired pneumonia) 08/25/2023   Pneumonia of right lower lobe due to infectious organism 06/10/2023   Suspicious nevus 01/05/2023   Anemia 01/05/2023   Deviated septum 01/05/2023   Episodic tension-type headache, not intractable 11/19/2022   Old myocardial infarction 11/19/2022   Postthrombotic syndrome 11/19/2022   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   Irritable bowel syndrome without diarrhea 11/19/2022   Ileostomy care (HCC) 11/18/2022   Diaper candidiasis 09/28/2022   Ileostomy, has currently (HCC) 09/28/2022   At risk for dehydration 09/16/2022   At high risk for dehydration 09/14/2022   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   Dehydration 08/26/2022   Irritant contact dermatitis associated with digestive stoma 08/26/2022   Ileostomy in place [facility]) 08/26/2022   High output ileostomy (HCC)  08/25/2022   Diastolic dysfunction 08/14/2022   Chronic facial pain 08/14/2022   Dyspepsia 08/14/2022   Blowout of rectal stump (HCC) 08/14/2022   Diverticulitis of colon 08/13/2022   Colostomy care (HCC) 07/31/2022    Bronchospasm 06/16/2022   Temporal arteritis (HCC) 06/16/2022   Chronic obstructive pulmonary disease with (acute) lower respiratory infection (HCC) 06/16/2022   Angina pectoris with documented spasm (HCC) 06/16/2022   Abdominal aortic aneurysm (AAA) without rupture (HCC) 06/16/2022   Right ankle sprain 06/09/2022   Sprain of right ankle 06/01/2022   Dizziness 06/01/2022   Rectal abscess 06/01/2022   Acute right ankle pain 05/19/2022   History of low anterior resection of rectum 05/04/2022   Irritant contact dermatitis associated with fecal stoma 05/02/2022   Slow transit constipation 05/02/2022   Colostomy complication (HCC) 05/02/2022   IDA (iron  deficiency anemia) 04/19/2022   Intra-abdominal abscess (HCC) 04/18/2022   Acute blood loss anemia 04/17/2022   Chronic diastolic CHF (congestive heart failure) (HCC) 04/17/2022   Toxic metabolic encephalopathy 04/17/2022   Ileus following gastrointestinal surgery (HCC) 04/17/2022   Pelvic abscess in female 03/30/2022   Flat foot 11/19/2021   S/P total knee arthroplasty, left 11/13/2021   Ankle impingement syndrome, left 11/13/2021   Rectal bleeding 08/22/2021   Abnormal thyroid  blood test 08/22/2021   Abnormal kidney function 08/22/2021   Dumping syndrome 03/13/2020   Arthritis    Bronchial pneumonia    Colon polyp    DVT (deep venous thrombosis) (HCC)    Heart murmur    Migraines    Pancreatitis    Thyroid  disease    Aortic root dilatation (HCC) 11/24/2019   Leukocytosis 11/06/2019   Generalized abdominal pain 11/06/2019   Pre-ulcerative corn or callous 09/07/2019   Nausea 09/07/2019   Pain of left calf 05/09/2019   Tibial pain 05/09/2019   Essential hypertension 02/08/2019   Healthcare maintenance 11/10/2018   Bradycardia 07/22/2018   Pansinusitis 06/23/2018   History of transient ischemic attack (TIA) 03/24/2018   IPF (idiopathic pulmonary fibrosis) (HCC) 03/24/2018   Chronic rhinitis 02/16/2018   Eustachian tube  dysfunction, bilateral 02/16/2018   URI (upper respiratory infection) 02/16/2018   Seasonal allergic rhinitis due to pollen 02/16/2018   ILD (interstitial lung disease) (HCC) 01/07/2018   Dyspnea 01/07/2018   Acute respiratory failure (HCC) 01/06/2018   TIA (transient ischemic attack) 01/06/2018   Hypokalemia 12/15/2017   Chronic cough 11/26/2017   Basal cell carcinoma (BCC) of neck 06/07/2017   Anisometropia 06/02/2017   Drusen of macula of both eyes 06/02/2017   Photopsia of left eye 06/02/2017   Cardiac murmur 05/07/2017   Situational anxiety 05/07/2017   Headache 05/07/2017   Mitral and aortic insufficiency 05/07/2017   Migraine without status migrainosus, not intractable 06/09/2016   Insomnia 11/18/2015   Normal coronary arteries 05/20/2015   RBBB 05/20/2015   Abnormal CT of the chest 05/20/2015   Hx of myocardial infarction 05/08/2015   Bruising 05/08/2015   Upper airway cough syndrome 02/14/2015   Asthmatic bronchitis with acute exacerbation 02/06/2015   Mild intermittent asthma without complication 02/06/2015   Oliguria 12/28/2014   Hypothyroidism 10/25/2014   Fatigue 10/25/2014   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   Chronic venous insufficiency 10/24/2014   Varicose veins of leg with complications 10/24/2014   Degeneration of intervertebral disc of cervical region 06/29/2014   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   Generalized osteoarthritis of multiple sites 06/29/2014   History of stroke without residual deficits 06/29/2014   Palpitations 11/08/2013  Edema of lower extremity 10/03/2013   Hair loss 11/25/2012   Alopecia 11/25/2012   Diverticulitis of large intestine with perforation and abscess 07/14/2012   Hammer toe 06/13/2012   History of stress test 05/21/2011   Hx of echocardiogram 01/27/2010   H/O cardiac catheterization 2004   PCP:  Estill Hemming, DO Pharmacy:   [id number] [place] [place] - [address]  AT [address]OSS [address] [place] [address] Phone: [phone] Fax: [phone]     Social Drivers of Health (SDOH) Social History: SDOH Screenings   Food Insecurity: No Food Insecurity (08/25/2023)  Housing: Low Risk  (08/25/2023)  Transportation Needs: No Transportation Needs (08/25/2023)  Utilities: Not At Risk (08/25/2023)  Depression (PHQ2-9): Low Risk  (06/04/2022)  Social Connections: Unknown (08/25/2023)  Tobacco Use: Low Risk  (08/25/2023)  Recent Concern: Tobacco Use - Medium Risk (06/27/2023)   Received from [facility]   SDOH Interventions:     Readmission Risk Interventions    11/20/2022    9:47 AM 04/08/2022    2:29 PM 04/03/2022    8:29 AM  Readmission Risk Prevention Plan  Post Dischage Appt   Complete  Medication Screening   Complete  Transportation Screening Complete Complete Complete  PCP or Specialist Appt within 5-7 Days  Complete   Home Care Screening  Complete   Medication Review (RN CM)  Complete   Medication Review (RN Care Manager) Complete    HRI or Home Care Consult Complete    SW Recovery Care/Counseling Consult Complete    Palliative Care Screening Not Applicable    Skilled Nursing Facility Not Applicable

## 2023-08-26 NOTE — Progress Notes (Addendum)
 Mobility Specialist - Progress Note   08/26/23 1044  Oxygen Therapy  SpO2 96 %  O2 Device Nasal Cannula  O2 Flow Rate (L/min) 2 L/min  Patient Activity (if Appropriate) Ambulating  Mobility  Activity Ambulated independently in room  Level of Assistance Independent  Assistive Device None  Distance Ambulated (ft) 5 ft  Activity Response Tolerated well  Mobility Referral Yes  Mobility visit 1 Mobility  Mobility Specialist Start Time (ACUTE ONLY) 1013  Mobility Specialist Stop Time (ACUTE ONLY) 1039  Mobility Specialist Time Calculation (min) (ACUTE ONLY) 26 min   Pt received in bed and agreeable to mobility. After ambulating ~28ft in room, pt c/o feeling fatigued. No other complaints during session. Pt to bed after session with all needs met.    Pre-mobility: 96% SpO2 During mobility: 96% SpO2  Chief Technology Officer

## 2023-08-26 NOTE — Progress Notes (Signed)
 [facility]  PROGRESS NOTE  Alexandria [name] UJW:[id number] DOB: 20-Jun-1945 DOA: 08/25/2023 PCP: Estill Hemming, DO   Brief HPI:   78 y.o. female, with history of idiopathic pulmonary fibrosis, hypertension, atrial tachycardia, diastolic dysfunction came to ED with complaints of dyspnea on exertion.  Patient recently had rhinoplasty done at [facility] and says that since that procedure patient has noted worsening dyspnea on exertion.  She also had a temperature of 101 yesterday.  Has been coughing up clear phlegm.     Assessment/Plan:   Acute hypoxemic respiratory failure-requiring 2 L/min of oxygen.  Likely from underlying pneumonia seen on CT chest lower procalcitonin less than 0.10.  BNP elevated 713.  Continue antibiotics ceftriaxone  and Zithromax .  Diuresed well with IV Lasix  20 mg IV.  Continue Mucinex , albuterol  nebulizer as needed.  Wean off oxygen as tolerated.   Acute on chronic diastolic CHF-BNP elevated 718.  Lasix  20 mg IV given as above.  Diuresed well with IV Lasix    Recent rhinoplasty-start Mucinex  600 mg p.o. twice daily.  Continue saline nasal spray as needed.   Hypertension-patient takes Cozaar  at home.  Will continue with Cozaar .  Add amlodipine 5 mg daily.  Continue hydralazine  as needed.     History of idiopathic pulmonary fibrosis-not on prednisone  at home.        Medications     amLODipine  5 mg Oral Daily   enoxaparin  (LOVENOX ) injection  40 mg Subcutaneous Q24H   guaiFENesin   600 mg Oral BID   levothyroxine   112 mcg Oral QODAY   [START ON 08/27/2023] levothyroxine   125 mcg Oral QODAY   losartan   50 mg Oral Daily   sodium chloride  flush  3 mL Intravenous Q12H     Data Reviewed:   CBG:  No results for input(s): "GLUCAP" in the last 168 hours.  SpO2: 97 % O2 Flow Rate (L/min): 2 L/min    Vitals:   08/26/23 0636 08/26/23 0917 08/26/23 1044 08/26/23 1359  BP: (!) 170/81 (!) 143/71  129/65  Pulse: 70 73  79  Resp:  18  18   Temp:  98.3 F (36.8 C)  98.2 F (36.8 C)  TempSrc:  Oral  Oral  SpO2:  95% 96% 97%  Weight:      Height:          Data Reviewed:  Basic Metabolic Panel: Recent Labs  Lab 08/25/23 0644 08/26/23 0448  NA 141 139  K 3.6 4.0  CL 104 105  CO2 29 25  GLUCOSE 112* 110*  BUN 12 9  CREATININE 0.73 0.62  CALCIUM  9.5 8.9    CBC: Recent Labs  Lab 08/25/23 0644 08/26/23 0448  WBC 7.7 5.9  NEUTROABS 4.5  --   HGB 12.3 11.7*  HCT 37.3 36.7  MCV 96.4 100.5*  PLT 268 252    LFT Recent Labs  Lab 08/26/23 0448  AST 20  ALT 10  ALKPHOS 81  BILITOT 0.8  PROT 6.7  ALBUMIN  3.1*     Antibiotics: Anti-infectives (From admission, onward)    Start     Dose/Rate Route Frequency Ordered Stop   08/26/23 1300  cefTRIAXone  (ROCEPHIN ) 2 g in sodium chloride  0.9 % 100 mL IVPB        2 g 200 mL/hr over 30 Minutes Intravenous Every 24 hours 08/25/23 1933 08/30/23 1259   08/26/23 1200  azithromycin  (ZITHROMAX ) 500 mg in sodium chloride  0.9 % 250 mL IVPB  500 mg 250 mL/hr over 60 Minutes Intravenous Every 24 hours 08/25/23 1933 08/30/23 1159   08/26/23 1000  cefTRIAXone  (ROCEPHIN ) 1 g in sodium chloride  0.9 % 100 mL IVPB  Status:  Discontinued        1 g 200 mL/hr over 30 Minutes Intravenous Every 24 hours 08/26/23 0419 08/26/23 0428   08/26/23 1000  azithromycin  (ZITHROMAX ) 500 mg in sodium chloride  0.9 % 250 mL IVPB  Status:  Discontinued        500 mg 250 mL/hr over 60 Minutes Intravenous Every 24 hours 08/26/23 0419 08/26/23 0428   08/25/23 1245  cefTRIAXone  (ROCEPHIN ) 2 g in sodium chloride  0.9 % 100 mL IVPB        2 g 200 mL/hr over 30 Minutes Intravenous  Once 08/25/23 1233 08/25/23 1329   08/25/23 1245  azithromycin  (ZITHROMAX ) 500 mg in sodium chloride  0.9 % 250 mL IVPB        500 mg 250 mL/hr over 60 Minutes Intravenous  Once 08/25/23 1233 08/25/23 1545        DVT prophylaxis: Lovenox   Code Status: Full code  Family Communication: No family at  bedside   CONSULTS    Subjective   Complains of dizziness, dyspnea on exertion.   Objective    Physical Examination:   General-appears in no acute distress Heart-S1-S2, regular, no murmur auscultated Lungs-bibasilar crackles auscultated Abdomen-soft, nontender, no organomegaly Extremities-no edema in the lower extremities Neuro-alert, oriented x3, no focal deficit noted  Status is: Inpatient:             Braxen Dobek S Tivon Lemoine   [facility] If 7PM-7AM, please contact night-coverage at [email or web address], Office  [phone]   08/26/2023, 4:15 PM  LOS: 1 day

## 2023-08-27 DIAGNOSIS — J189 Pneumonia, unspecified organism: Secondary | ICD-10-CM | POA: Diagnosis not present

## 2023-08-27 DIAGNOSIS — R0902 Hypoxemia: Secondary | ICD-10-CM | POA: Diagnosis not present

## 2023-08-27 LAB — CBC
HCT: 43.4 % (ref 36.0–46.0)
Hemoglobin: 13.9 g/dL (ref 12.0–15.0)
MCH: 32 pg (ref 26.0–34.0)
MCHC: 32 g/dL (ref 30.0–36.0)
MCV: 99.8 fL (ref 80.0–100.0)
Platelets: 281 10*3/uL (ref 150–400)
RBC: 4.35 MIL/uL (ref 3.87–5.11)
RDW: 13.9 % (ref 11.5–15.5)
WBC: 5.5 10*3/uL (ref 4.0–10.5)
nRBC: 0 % (ref 0.0–0.2)

## 2023-08-27 LAB — RESPIRATORY PANEL BY PCR

## 2023-08-27 LAB — BASIC METABOLIC PANEL WITH GFR
Anion gap: 10 (ref 5–15)
BUN: 15 mg/dL (ref 8–23)
CO2: 26 mmol/L (ref 22–32)
Calcium: 9.3 mg/dL (ref 8.9–10.3)
Chloride: 100 mmol/L (ref 98–111)
Creatinine, Ser: 0.78 mg/dL (ref 0.44–1.00)
GFR, Estimated: >60 mL/min (ref >60)
Glucose, Bld: 167 mg/dL — ABNORMAL HIGH (ref 70–99)
Potassium: 4.1 mmol/L (ref 3.5–5.1)
Sodium: 136 mmol/L (ref 135–145)

## 2023-08-27 MED ORDER — AZITHROMYCIN 250 MG PO TABS
500.0000 mg | ORAL_TABLET | Freq: Every day | ORAL | Status: DC
  Administered 2023-08-27 – 2023-08-28 (×2): 500 mg via ORAL
  Filled 2023-08-27 (×2): qty 2

## 2023-08-27 MED ORDER — DIPHENOXYLATE-ATROPINE 2.5-0.025 MG PO TABS
1.0000 | ORAL_TABLET | Freq: Four times a day (QID) | ORAL | Status: DC | PRN
  Administered 2023-08-27: 1 via ORAL
  Filled 2023-08-27: qty 1

## 2023-08-27 NOTE — Plan of Care (Signed)

## 2023-08-27 NOTE — Progress Notes (Signed)
 [facility]  PROGRESS NOTE  WILDER AMODEI ZOX:[id number] DOB: Dec 25, 1945 DOA: 08/25/2023 PCP: Estill Hemming, DO   Brief HPI:   78 y.o. female, with history of idiopathic pulmonary fibrosis, hypertension, atrial tachycardia, diastolic dysfunction came to ED with complaints of dyspnea on exertion.  Patient recently had rhinoplasty done at [facility] and says that since that procedure patient has noted worsening dyspnea on exertion.  She also had a temperature of 101 yesterday.  Has been coughing up clear phlegm.     Assessment/Plan:   Acute hypoxemic respiratory failure-requiring 2 L/min of oxygen.  Likely from underlying pneumonia seen on CT chest lower procalcitonin less than 0.10.  BNP elevated 713.  Continue antibiotics ceftriaxone  and Zithromax .  Diuresed well with IV Lasix  20 mg IVx2.  Continue Mucinex , albuterol  nebulizer as needed.  Wean off oxygen as tolerated.   Acute on chronic diastolic CHF-BNP elevated 718.  Lasix  20 mg IV x2 given as above.  Diuresed well with IV Lasix    Recent rhinoplasty-start Mucinex  600 mg p.o. twice daily.  Continue saline nasal spray as needed.   Hypertension-patient takes Cozaar  at home.  Blood pressure was elevated.  Added amlodipine 5 mg daily.  Blood pressure has now improved. Continue hydralazine  as needed.     History of idiopathic pulmonary fibrosis-not on prednisone  at home.        Medications     amLODipine  5 mg Oral Daily   enoxaparin  (LOVENOX ) injection  40 mg Subcutaneous Q24H   guaiFENesin   600 mg Oral BID   levothyroxine   112 mcg Oral QODAY   levothyroxine   125 mcg Oral QODAY   losartan   50 mg Oral Daily   methylPREDNISolone  (SOLU-MEDROL ) injection  40 mg Intravenous Q12H   sodium chloride  flush  3 mL Intravenous Q12H     Data Reviewed:   CBG:  No results for input(s): "GLUCAP" in the last 168 hours.  SpO2: 99 % O2 Flow Rate (L/min): 2 L/min FiO2 (%): (!) 2 %    Vitals:   08/26/23 0917 08/26/23  1044 08/26/23 1359 08/27/23 0500  BP: (!) 143/71  129/65 130/76  Pulse: 73  79 85  Resp: 18  18 18   Temp: 98.3 F (36.8 C)  98.2 F (36.8 C) (!) 97.4 F (36.3 C)  TempSrc: Oral  Oral Oral  SpO2: 95% 96% 97% 99%  Weight:      Height:          Data Reviewed:  Basic Metabolic Panel: Recent Labs  Lab 08/25/23 0644 08/26/23 0448 08/27/23 0412  NA 141 139 136  K 3.6 4.0 4.1  CL 104 105 100  CO2 29 25 26   GLUCOSE 112* 110* 167*  BUN 12 9 15   CREATININE 0.73 0.62 0.78  CALCIUM  9.5 8.9 9.3    CBC: Recent Labs  Lab 08/25/23 0644 08/26/23 0448 08/27/23 0412  WBC 7.7 5.9 5.5  NEUTROABS 4.5  --   --   HGB 12.3 11.7* 13.9  HCT 37.3 36.7 43.4  MCV 96.4 100.5* 99.8  PLT 268 252 281    LFT Recent Labs  Lab 08/26/23 0448  AST 20  ALT 10  ALKPHOS 81  BILITOT 0.8  PROT 6.7  ALBUMIN  3.1*     Antibiotics: Anti-infectives (From admission, onward)    Start     Dose/Rate Route Frequency Ordered Stop   08/26/23 1300  cefTRIAXone  (ROCEPHIN ) 2 g in sodium chloride  0.9 % 100 mL IVPB  2 g 200 mL/hr over 30 Minutes Intravenous Every 24 hours 08/25/23 1933 08/30/23 1259   08/26/23 1200  azithromycin  (ZITHROMAX ) 500 mg in sodium chloride  0.9 % 250 mL IVPB        500 mg 250 mL/hr over 60 Minutes Intravenous Every 24 hours 08/25/23 1933 08/30/23 1159   08/26/23 1000  cefTRIAXone  (ROCEPHIN ) 1 g in sodium chloride  0.9 % 100 mL IVPB  Status:  Discontinued        1 g 200 mL/hr over 30 Minutes Intravenous Every 24 hours 08/26/23 0419 08/26/23 0428   08/26/23 1000  azithromycin  (ZITHROMAX ) 500 mg in sodium chloride  0.9 % 250 mL IVPB  Status:  Discontinued        500 mg 250 mL/hr over 60 Minutes Intravenous Every 24 hours 08/26/23 0419 08/26/23 0428   08/25/23 1245  cefTRIAXone  (ROCEPHIN ) 2 g in sodium chloride  0.9 % 100 mL IVPB        2 g 200 mL/hr over 30 Minutes Intravenous  Once 08/25/23 1233 08/25/23 1329   08/25/23 1245  azithromycin  (ZITHROMAX ) 500 mg in sodium  chloride 0.9 % 250 mL IVPB        500 mg 250 mL/hr over 60 Minutes Intravenous  Once 08/25/23 1233 08/25/23 1545        DVT prophylaxis: Lovenox   Code Status: Full code  Family Communication: No family at bedside   CONSULTS    Subjective   Breathing has improved  Objective    Physical Examination:   General-appears in no acute distress Heart-S1-S2, regular, no murmur auscultated Lungs-faint crackles at lung bases Abdomen-soft, nontender, no organomegaly Extremities-no edema in the lower extremities Neuro-alert, oriented x3, no focal deficit noted  Status is: Inpatient:             Ozell Blunt   [facility] If 7PM-7AM, please contact night-coverage at [email or web address], Office  [phone]   08/27/2023, 8:15 AM  LOS: 2 days

## 2023-08-27 NOTE — Progress Notes (Signed)
 Mobility Specialist - Progress Note   08/27/23 0934  Mobility  Activity Ambulated with assistance in room;Stood at bedside  Level of Assistance Independent after set-up  Assistive Device None  Distance Ambulated (ft) 2 ft  Activity Response Tolerated well  Mobility Referral Yes  Mobility visit 1 Mobility  Mobility Specialist Start Time (ACUTE ONLY) [id number]  Mobility Specialist Stop Time (ACUTE ONLY) 0934  Mobility Specialist Time Calculation (min) (ACUTE ONLY) 16 min   Pt received in bed and agreeable to mobility. Once standing pt stated that she could no longer ambulate further d/t coughing. No other complaints during session. Pt to bed after session with all needs met.  During mobility: 92% SpO2  Chief Technology Officer

## 2023-08-28 DIAGNOSIS — R053 Chronic cough: Secondary | ICD-10-CM | POA: Diagnosis not present

## 2023-08-28 DIAGNOSIS — J84112 Idiopathic pulmonary fibrosis: Secondary | ICD-10-CM | POA: Diagnosis not present

## 2023-08-28 DIAGNOSIS — J189 Pneumonia, unspecified organism: Secondary | ICD-10-CM | POA: Diagnosis not present

## 2023-08-28 MED ORDER — ALPRAZOLAM 0.5 MG PO TABS: 0.5000 mg | ORAL_TABLET | Freq: Two times a day (BID) | ORAL | 0 refills | Status: DC | PRN

## 2023-08-28 MED ORDER — AMLODIPINE BESYLATE 5 MG PO TABS: 5.0000 mg | ORAL_TABLET | Freq: Every day | ORAL | 2 refills | Status: DC

## 2023-08-28 MED ORDER — GUAIFENESIN ER 600 MG PO TB12: 600.0000 mg | ORAL_TABLET | Freq: Two times a day (BID) | ORAL | 0 refills | Status: AC

## 2023-08-28 MED ORDER — PREDNISONE 10 MG PO TABS: ORAL_TABLET | ORAL | 0 refills | Status: DC

## 2023-08-28 MED ORDER — BENZONATATE 200 MG PO CAPS: 200.0000 mg | ORAL_CAPSULE | Freq: Three times a day (TID) | ORAL | 3 refills | Status: DC | PRN

## 2023-08-28 MED ORDER — AMOXICILLIN-POT CLAVULANATE 875-125 MG PO TABS: 1.0000 | ORAL_TABLET | Freq: Two times a day (BID) | ORAL | 0 refills | Status: AC
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
## 2023-08-28 NOTE — Plan of Care (Signed)
  Problem: Clinical Measurements: Goal: Respiratory complications will improve Outcome: Progressing   Problem: Elimination: Goal: Will not experience complications related to bowel motility Outcome: Progressing   Problem: Pain Managment: Goal: General experience of comfort will improve and/or be controlled Outcome: Progressing

## 2023-08-28 NOTE — Progress Notes (Incomplete)
 [facility]  PROGRESS NOTE  Alexandria [name] UXL:[id number] DOB: Aug 05, 1945 DOA: 08/25/2023 PCP: Estill Hemming, DO   Brief HPI:   78 y.o. female, with history of idiopathic pulmonary fibrosis, hypertension, atrial tachycardia, diastolic dysfunction came to ED with complaints of dyspnea on exertion.  Patient recently had rhinoplasty done at [facility] and says that since that procedure patient has noted worsening dyspnea on exertion.  She also had a temperature of 101 yesterday.  Has been coughing up clear phlegm.     Assessment/Plan:   Acute hypoxemic respiratory failure-requiring 2 L/min of oxygen.  Likely from underlying pneumonia seen on CT chest lower procalcitonin less than 0.10.  BNP elevated 713.  Continue antibiotics ceftriaxone  and Zithromax .  Diuresed well with IV Lasix  20 mg IVx2.  Continue Mucinex , albuterol  nebulizer as needed.  Wean off oxygen as tolerated.   Acute on chronic diastolic CHF-BNP elevated 718.  Lasix  20 mg IV x2 given as above.  Diuresed well with IV Lasix    Recent rhinoplasty-start Mucinex  600 mg p.o. twice daily.  Continue saline nasal spray as needed.   Hypertension-patient takes Cozaar  at home.  Blood pressure was elevated.  Added amlodipine 5 mg daily.  Blood pressure has now improved. Continue hydralazine  as needed.     History of idiopathic pulmonary fibrosis-not on prednisone  at home.        Medications     amLODipine  5 mg Oral Daily   azithromycin   500 mg Oral Daily   enoxaparin  (LOVENOX ) injection  40 mg Subcutaneous Q24H   guaiFENesin   600 mg Oral BID   levothyroxine   112 mcg Oral QODAY   levothyroxine   125 mcg Oral QODAY   losartan   50 mg Oral Daily   methylPREDNISolone  (SOLU-MEDROL ) injection  40 mg Intravenous Q12H   sodium chloride  flush  3 mL Intravenous Q12H     Data Reviewed:   CBG:  No results for input(s): "GLUCAP" in the last 168 hours.  SpO2: 94 % O2 Flow Rate (L/min): 2 L/min FiO2 (%): (!) 2 %     Vitals:   08/27/23 0959 08/27/23 1228 08/27/23 2022 08/28/23 0450  BP: 132/78 103/63 135/68 134/80  Pulse:  83 87 77  Resp:  18 20 20   Temp:  (!) 97.3 F (36.3 C) 97.6 F (36.4 C) 97.8 F (36.6 C)  TempSrc:  Oral  Oral  SpO2:  94% 93% 94%  Weight:      Height:          Data Reviewed:  Basic Metabolic Panel: Recent Labs  Lab 08/25/23 0644 08/26/23 0448 08/27/23 0412  NA 141 139 136  K 3.6 4.0 4.1  CL 104 105 100  CO2 29 25 26   GLUCOSE 112* 110* 167*  BUN 12 9 15   CREATININE 0.73 0.62 0.78  CALCIUM  9.5 8.9 9.3    CBC: Recent Labs  Lab 08/25/23 0644 08/26/23 0448 08/27/23 0412  WBC 7.7 5.9 5.5  NEUTROABS 4.5  --   --   HGB 12.3 11.7* 13.9  HCT 37.3 36.7 43.4  MCV 96.4 100.5* 99.8  PLT 268 252 281    LFT Recent Labs  Lab 08/26/23 0448  AST 20  ALT 10  ALKPHOS 81  BILITOT 0.8  PROT 6.7  ALBUMIN  3.1*     Antibiotics: Anti-infectives (From admission, onward)    Start     Dose/Rate Route Frequency Ordered Stop   08/27/23 1045  azithromycin  (ZITHROMAX ) tablet 500 mg  500 mg Oral Daily 08/27/23 0947 08/30/23 0959   08/26/23 1300  cefTRIAXone  (ROCEPHIN ) 2 g in sodium chloride  0.9 % 100 mL IVPB        2 g 200 mL/hr over 30 Minutes Intravenous Every 24 hours 08/25/23 1933 08/30/23 1259   08/26/23 1200  azithromycin  (ZITHROMAX ) 500 mg in sodium chloride  0.9 % 250 mL IVPB  Status:  Discontinued        500 mg 250 mL/hr over 60 Minutes Intravenous Every 24 hours 08/25/23 1933 08/27/23 0947   08/26/23 1000  cefTRIAXone  (ROCEPHIN ) 1 g in sodium chloride  0.9 % 100 mL IVPB  Status:  Discontinued        1 g 200 mL/hr over 30 Minutes Intravenous Every 24 hours 08/26/23 0419 08/26/23 0428   08/26/23 1000  azithromycin  (ZITHROMAX ) 500 mg in sodium chloride  0.9 % 250 mL IVPB  Status:  Discontinued        500 mg 250 mL/hr over 60 Minutes Intravenous Every 24 hours 08/26/23 0419 08/26/23 0428   08/25/23 1245  cefTRIAXone  (ROCEPHIN ) 2 g in sodium chloride   0.9 % 100 mL IVPB        2 g 200 mL/hr over 30 Minutes Intravenous  Once 08/25/23 1233 08/25/23 1329   08/25/23 1245  azithromycin  (ZITHROMAX ) 500 mg in sodium chloride  0.9 % 250 mL IVPB        500 mg 250 mL/hr over 60 Minutes Intravenous  Once 08/25/23 1233 08/25/23 1545        DVT prophylaxis: Lovenox   Code Status: Full code  Family Communication: No family at bedside   CONSULTS    Subjective     Objective    Physical Examination:    Status is: Inpatient:             Ozell Blunt   [facility] If 7PM-7AM, please contact night-coverage at [email or web address], Office  [phone]   08/28/2023, 9:00 AM  LOS: 3 days

## 2023-08-28 NOTE — Progress Notes (Signed)
 Pt discharge instructions read and reviewed. Pt escorted to main entrance to daughter's personal vehicle.

## 2023-08-28 NOTE — Progress Notes (Signed)
 Mobility Specialist - Progress Note   08/28/23 1242  Mobility  Activity Ambulated with assistance in hallway  Level of Assistance Modified independent, requires aide device or extra time  Assistive Device Front wheel walker  Distance Ambulated (ft) 80 ft  Activity Response Tolerated well  Mobility Referral Yes  Mobility visit 1 Mobility  Mobility Specialist Start Time (ACUTE ONLY) 1143  Mobility Specialist Stop Time (ACUTE ONLY) 1203  Mobility Specialist Time Calculation (min) (ACUTE ONLY) 20 min   Pt received in bed and agreeable to mobility. Distance limited d/t fatigue. No complaints during session. Pt to bed after session with all needs met.    Pre-mobility: 90% SpO2 During mobility: 114 HR Post-mobility: 112 HR, 90% SPO2  Chief Technology Officer

## 2023-08-28 NOTE — Discharge Summary (Addendum)
 Physician Discharge Summary   Patient: Alexandria [name] MRN: [id number] DOB: 30-Nov-1945  Admit date:     08/25/2023  Discharge date: 08/28/23  Discharge Physician: Ozell Blunt   PCP: Estill Hemming, DO   Recommendations at discharge:   Follow-up PCP in 1 week  Discharge Diagnoses: Principal Problem:   CAP (community acquired pneumonia)  Resolved Problems:   * No resolved hospital problems. *  Hospital Course:  78 y.o. female, with history of idiopathic pulmonary fibrosis, hypertension, atrial tachycardia, diastolic dysfunction came to ED with complaints of dyspnea on exertion.  Patient recently had rhinoplasty done at [facility] and says that since that procedure patient has noted worsening dyspnea on exertion.  She also had a temperature of 101 yesterday.  Has been coughing up clear phlegm.     Assessment and Plan:  Acute hypoxemic respiratory failure-resolved.  Required2 L/min of oxygen on admission.  Likely from underlying pneumonia seen on CT chest lower procalcitonin less than 0.10.  BNP elevated 713.  Started on antibiotics ceftriaxone  and Zithromax .  Diuresed with IV Lasix  20 mg IV x 2.  Will discharge on Mucinex , prednisone  taper, albuterol  as needed.  Will also discharge on Augmentin  1 tablet p.o. twice daily for 5 days.  No longer requiring oxygen.  O2 sats 95% on room air.   Acute on chronic diastolic CHF-BNP elevated 718.  Lasix  20 mg IV x2 given as above.  Diuresed well with IV Lasix .  Appears dry now, will not give any more further doses of Lasix .   Recent rhinoplasty-start Mucinex  600 mg p.o. twice daily.  Continue saline nasal spray as needed.   Hypertension-patient takes Cozaar  at home.  Blood pressure was elevated.  Added amlodipine 5 mg daily.  Blood pressure has now improved.    History of idiopathic pulmonary fibrosis-not on prednisone  at home.  Started on prednisone  taper as above.          Consultants:  Procedures performed:   Disposition: Home Diet recommendation:  Discharge Diet Orders (From admission, onward)     Start     Ordered   08/28/23 0000  Diet - low sodium heart healthy        08/28/23 1333           Regular diet DISCHARGE MEDICATION: Allergies as of 08/28/2023       Reactions   Prednisone  Other (See Comments)   Changed personality    Dilaudid  [hydromorphone ] Itching, Other (See Comments)   Dilaudid  caused marked confusion   Ipratropium Bromide  Itching, Other (See Comments)   Mental Status Changes, Insomnia Insomnia - "Atrovent ."   Atrovent  Hfa [ipratropium Bromide  Hfa] Itching, Other (See Comments)   Pt could not sleep, Insomnia    Cheratussin Ac [guaifenesin -codeine ] Other (See Comments)   Headaches    Hydrocodone Itching   Influenza Vaccines Other (See Comments)   Pt reports heart attack after last flu shot   Morphine  Itching   Oxycodone  Itching   Codeine  Itching, Nausea Only, Other (See Comments)   Pt takes promethazine  with codeine  at home   Diltiazem  Itching, Other (See Comments)   Makes patient sick. Shakes. GI   Ibuprofen  Itching, Other (See Comments)   "Gives false reading in blood"        Medication List     STOP taking these medications    azithromycin  250 MG tablet Commonly known as: Zithromax  Z-Pak       TAKE these medications    albuterol  108 (90 Base) MCG/ACT  inhaler Commonly known as: VENTOLIN  HFA Inhale 2 puffs into the lungs every 6 (six) hours as needed (coughing).   ALPRAZolam  0.5 MG tablet Commonly known as: XANAX  Take 1 tablet (0.5 mg total) by mouth 2 (two) times daily as needed for anxiety.   amLODipine 5 MG tablet Commonly known as: NORVASC Take 1 tablet (5 mg total) by mouth daily. Start taking on: Aug 29, 2023   amoxicillin  500 MG capsule Commonly known as: AMOXIL  Take 2,000 mg by mouth as directed. Take 1 hour prior to dental appointment.   amoxicillin -clavulanate 875-125 MG tablet Commonly known as: AUGMENTIN  Take 1 tablet  by mouth 2 (two) times daily for 5 days.   ascorbic acid  500 MG tablet Commonly known as: VITAMIN C  Take 500 mg by mouth 2 (two) times a week.   aspirin  EC 81 MG tablet Take 81 mg by mouth 3 (three) times a week. Swallow whole.   benzonatate  200 MG capsule Commonly known as: TESSALON  Take 1 capsule (200 mg total) by mouth 3 (three) times daily as needed for cough.   diphenoxylate-atropine 2.5-0.025 MG tablet Commonly known as: LOMOTIL Take 2 tablets by mouth 3 (three) times daily as needed for diarrhea or loose stools.   folic acid  400 MCG tablet Commonly known as: FOLVITE  Take 1 tablet by mouth every other day.   furosemide  20 MG tablet Commonly known as: LASIX  Take 1 tablet (20 mg total) by mouth daily. What changed:  when to take this reasons to take this   guaiFENesin  600 MG 12 hr tablet Commonly known as: MUCINEX  Take 1 tablet (600 mg total) by mouth 2 (two) times daily for 5 days.   ipratropium-albuterol  0.5-2.5 (3) MG/3ML Soln Commonly known as: DUONEB Take 3 mLs by nebulization every 6 (six) hours as needed.   Synthroid  112 MCG tablet Generic drug: levothyroxine  Take 112 mcg by mouth every other day. Take 125 mcg tablet on opposite days. What changed: Another medication with the same name was changed. Make sure you understand how and when to take each.   levothyroxine  125 MCG tablet Commonly known as: SYNTHROID  1 PO MWF and 1/2 tab all other days What changed:  how much to take how to take this when to take this additional instructions   losartan  50 MG tablet Commonly known as: COZAAR  Take 50 mg by mouth daily.   magnesium  oxide 400 MG tablet Commonly known as: MAG-OX Take 1 tablet (400 mg total) by mouth daily.   multivitamin with minerals Tabs tablet Take 1 tablet by mouth once a week.   mupirocin ointment 2 % Commonly known as: BACTROBAN Apply 1 Application topically 2 (two) times daily.   Ofev  100 MG Caps Generic drug: Nintedanib  Take 100  mg by mouth 2 (two) times daily.   ondansetron  4 MG disintegrating tablet Commonly known as: ZOFRAN -ODT TAKE 1 TABLET BY MOUTH EVERY 8 HOURS AS NEEDED FOR NAUSEA AND VOMITING What changed: See the new instructions.   predniSONE  10 MG tablet Commonly known as: DELTASONE  Prednisone  40 mg po daily x 1 day then Prednisone  30 mg po daily x 1 day then Prednisone  20 mg po daily x 1 day then Prednisone  10 mg daily x 1 day then stop...   promethazine -dextromethorphan 6.25-15 MG/5ML syrup Commonly known as: PROMETHAZINE -DM Take 5 mLs by mouth 4 (four) times daily as needed for cough.        Discharge Exam: Filed Weights   08/25/23 0648  Weight: 62.6 kg   General-appears in no  acute distress Heart-S1-S2, regular, no murmur auscultated Lungs-clear to auscultation bilaterally, no wheezing or crackles auscultated Abdomen-soft, nontender, no organomegaly Extremities-no edema in the lower extremities Neuro-alert, oriented x3, no focal deficit noted  Condition at discharge: good  The results of significant diagnostics from this hospitalization (including imaging, microbiology, ancillary and laboratory) are listed below for reference.   Imaging Studies: CT Angio Chest PE W and/or Wo Contrast Result Date: 08/25/2023 CLINICAL DATA:  Pulmonary embolism (PE) suspected, high prob, dyspnea, IPF EXAM: CT ANGIOGRAPHY CHEST WITH CONTRAST TECHNIQUE: Multidetector CT imaging of the chest was performed using the standard protocol during bolus administration of intravenous contrast. Multiplanar CT image reconstructions and MIPs were obtained to evaluate the vascular anatomy. RADIATION DOSE REDUCTION: This exam was performed according to the departmental dose-optimization program which includes automated exposure control, adjustment of the mA and/or kV according to patient size and/or use of iterative reconstruction technique. CONTRAST:  75mL OMNIPAQUE  IOHEXOL  350 MG/ML SOLN COMPARISON:  May 18, 2023  FINDINGS: Pulmonary Embolism: No pulmonary embolism. Abnormal dilation of the main pulmonary artery, consistent with pulmonary arterial hypertension. Cardiovascular: Mild cardiomegaly with a right atrial predominance. Ectasia of the ascending aorta measuring 3.9 cm. No aneurysm. No aortic dissection. Diffuse calcified aortic atherosclerosis. Mediastinum/Nodes: No mediastinal mass. A few mildly enlarged AP window and subcarinal lymph nodes present measuring up to 1.2 cm. These are likely reactive. No axillary or hilar lymphadenopathy. Lungs/Pleura: The trachea is midline and patent. Fairly diffuse traction bronchiectasis, most notably in the left upper lobe/lingula, and right lower lobe. Subpleural reticulation with honeycombing in the right lower lobe and lingula. Superimposed ground-glass airspace disease now present in the left upper lobe and lingula and both lower lobes. Trace left pleural effusion. No pneumothorax. Musculoskeletal: No acute fracture or destructive bone lesion. Partially visualized cervical fusion hardware. Right glenohumeral joint osteoarthritis. Diffuse osteopenia. Multilevel degenerative disc disease of the spine. Upper Abdomen: No acute abnormality in the partially visualized upper abdomen. Review of the MIP images confirms the above findings. IMPRESSION: 1. Redemonstrated findings consistent with UIP pattern pulmonary fibrosis. Superimposed ground-glass airspace opacities have developed in the interim predominantly in the left upper lobe, lingula, and both lower lobes, worrisome for a multifocal pneumonia. Trace left pleural effusion. 2. The pulmonary artery remains dilated, consistent with pulmonary arterial hypertension. No pulmonary embolism or aortic dissection. 3. Mildly enlarged AP and subcarinal lymph nodes, likely reactive. Aortic Atherosclerosis (ICD10-I70.0). Electronically Signed   By: Rance Burrows M.D.   On: 08/25/2023 10:11   DG Chest 2 View Result Date:  08/24/2023 CLINICAL DATA:  Low oxygen breathing difficulty recent rhinoplasty EXAM: CHEST - 2 VIEW COMPARISON:  06/28/2023, CT chest 05/18/2023, chest x-ray 02/12/2023 FINDINGS: Diffuse reticular opacity corresponding to history of chronic interstitial fibrosis. No pleural effusion. Difficult to exclude mild superimposed ground-glass infiltrate in the left mid to lower lung. Stable cardiomediastinal silhouette with aortic atherosclerosis. No pneumothorax. IMPRESSION: Diffuse reticular opacity corresponding to history of chronic interstitial fibrosis. Difficult to exclude mild superimposed ground-glass infiltrate in the left mid to lower lung. Electronically Signed   By: Esmeralda Hedge M.D.   On: 08/24/2023 17:28   LONG TERM MONITOR (3-14 DAYS) Result Date: 07/30/2023   The dominant rhythm is normal sinus rhythm with normal circadian rhythm variation.   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  The longest episode is 19 seconds in duration.  There is no  evidence of atrial fibrillation.   There are rare premature ventricular contractions and a single 4 beat episode of nonsustained ventricular tachycardia.   There is no evidence of severe bradycardia sinus pauses or high-grade AV block. Mildly abnormal arrhythmia monitor due to the occurrence of occasional premature atrial contractions and occasional brief episodes of nonsustained ectopic atrial tachycardia.  No high risk arrhythmia is detected. Patch Wear Time:  13 days and 13 hours (2025-03-25T23:28:59-0400 to 2025-04-08T12:42:42-0400) Patient had a min HR of 48 bpm, max HR of 211 bpm, and avg HR of 68 bpm. Predominant underlying rhythm was Sinus Rhythm. Bundle Branch Block/IVCD was present. 1 run of Ventricular Tachycardia occurred lasting 4 beats with a max rate of 141 bpm (avg 129 bpm). 196  Supraventricular Tachycardia runs occurred, the run with the fastest interval lasting 20 beats with a max rate of 211 bpm, the longest lasting 18.9 secs with an avg rate of 121 bpm. Some episodes of Supraventricular Tachycardia may be possible Atrial Tachycardia with variable block. Isolated SVEs were occasional (5.0%, [id number]), SVE Couplets were rare (<1.0%, 3271), and SVE Triplets were rare (<1.0%, 1279). Isolated VEs were rare (<1.0%), VE Couplets were rare (<1.0%), and no VE Triplets were present.    Microbiology: Results for orders placed or performed during the hospital encounter of 08/25/23  Culture, blood (Routine X 2) w Reflex to ID Panel     Status: None (Preliminary result)   Collection Time: 08/25/23  7:11 PM   Specimen: BLOOD RIGHT ARM  Result Value Ref Range Status   Specimen Description   Final    BLOOD RIGHT ARM Performed at [facility], [address], [place], [place] [address]    Special Requests   Final    BOTTLES DRAWN AEROBIC ONLY Blood Culture results may not be optimal due to an inadequate volume of blood received in culture bottles Performed at [facility], [address] [place], [place] [address]    Culture   Final    NO GROWTH 3 DAYS Performed at [facility], [address], [place], [place] [address]    Report Status PENDING  Incomplete  Culture, blood (Routine X 2) w Reflex to ID Panel     Status: None (Preliminary result)   Collection Time: 08/25/23  7:12 PM   Specimen: BLOOD RIGHT ARM  Result Value Ref Range Status   Specimen Description   Final    BLOOD RIGHT ARM Performed at [facility], [address] [place], [place] [address]    Special Requests   Final    BOTTLES DRAWN AEROBIC AND ANAEROBIC Blood Culture adequate volume Performed at [facility] [address], [place], [place] [address]    Culture   Final    NO GROWTH 3 DAYS Performed at [facility], [address] [place], [place] [address]    Report Status PENDING  Incomplete  Respiratory (~20 pathogens) panel by PCR     Status: None   Collection Time: 08/27/23  6:15 AM   Specimen: Nasopharyngeal Swab; Respiratory  Result Value Ref Range Status   Adenovirus NOT DETECTED NOT DETECTED Final   Coronavirus 229E NOT DETECTED NOT DETECTED Final    Comment: (NOTE) The Coronavirus on the Respiratory Panel, DOES NOT test for the novel  Coronavirus (2019 nCoV)    Coronavirus HKU1 NOT DETECTED NOT DETECTED Final   Coronavirus NL63 NOT DETECTED NOT DETECTED Final   Coronavirus OC43 NOT  DETECTED NOT DETECTED Final   Metapneumovirus NOT DETECTED NOT DETECTED Final   Rhinovirus / Enterovirus NOT DETECTED NOT DETECTED Final   Influenza A NOT DETECTED NOT DETECTED Final   Influenza B NOT DETECTED NOT DETECTED Final   Parainfluenza Virus 1 NOT DETECTED NOT DETECTED Final   Parainfluenza Virus 2 NOT DETECTED NOT DETECTED Final   Parainfluenza Virus 3 NOT DETECTED NOT DETECTED Final   Parainfluenza Virus 4 NOT DETECTED NOT DETECTED Final   Respiratory Syncytial Virus NOT DETECTED NOT DETECTED Final   Bordetella pertussis NOT DETECTED NOT DETECTED Final   Bordetella Parapertussis NOT DETECTED NOT DETECTED Final   Chlamydophila pneumoniae NOT DETECTED NOT DETECTED Final   Mycoplasma pneumoniae NOT DETECTED NOT DETECTED Final    Comment: Performed at [facility], [address], [place], [place] [address]   *Note: Due to a large number of results and/or encounters for the requested time period, some results have not been displayed. A complete set of results can be found in Results Review.    Labs: CBC: Recent Labs  Lab 08/25/23 0644 08/26/23 0448 08/27/23 0412  WBC 7.7 5.9 5.5  NEUTROABS 4.5  --   --   HGB 12.3 11.7* 13.9  HCT 37.3 36.7 43.4  MCV 96.4 100.5* 99.8  PLT 268 252 281   Basic Metabolic Panel: Recent Labs  Lab 08/25/23 0644 08/26/23 0448 08/27/23 0412  NA 141  139 136  K 3.6 4.0 4.1  CL 104 105 100  CO2 29 25 26   GLUCOSE 112* 110* 167*  BUN 12 9 15   CREATININE 0.73 0.62 0.78  CALCIUM  9.5 8.9 9.3   Liver Function Tests: Recent Labs  Lab 08/26/23 0448  AST 20  ALT 10  ALKPHOS 81  BILITOT 0.8  PROT 6.7  ALBUMIN  3.1*   CBG: No results for input(s): "GLUCAP" in the last 168 hours.  Discharge time spent: greater than 30 minutes.  Signed: Ozell Blunt, MD [facility] 08/28/2023

## 2023-08-30 LAB — CULTURE, BLOOD (ROUTINE X 2)
Culture: NO GROWTH
Culture: NO GROWTH
Special Requests: ADEQUATE

## 2023-08-31 ENCOUNTER — Telehealth: Payer: Self-pay

## 2023-08-31 NOTE — Patient Instructions (Signed)
 Visit Information  Thank you for taking time to visit with me today. Please don't hesitate to contact me if I can be of assistance to you before our next scheduled telephone appointment.  Our next appointment is by telephone on Tuesday June 3rd at 11:00am  Following is a copy of your care plan:   Goals Addressed             This Visit's Progress    [facility] Transitions of Care (TOC) Care Plan       Problems:  Recent Hospitalization for treatment of Anxiety, CHF, and Pulmonary Disease Equipment/DME barrier Was not discharged home on a nebulizer or medication for ILD and Pneumonia  Goal:  Over the next 30 days, the patient will not experience hospital readmission  Interventions:   Heart Failure Interventions: Provided education on low sodium diet Advised patient to weigh each morning after emptying bladder Discussed importance of daily weight and advised patient to weigh and record daily Reviewed role of diuretics in prevention of fluid overload and management of heart failure; Provided patient with education about the role of exercise in the management of heart failure Screening for signs and symptoms of depression related to chronic disease state   COPD Interventions: Advised patient to track and manage COPD triggers Discussed the importance of adequate rest and management of fatigue with COPD Provided education about and advised patient to utilize infection prevention strategies to reduce risk of respiratory infection Provided instruction about proper use of medications used for management of COPD including inhalers Screening for signs and symptoms of depression related to chronic disease state  Use of home nebulizer  Patient Self Care Activities:  Attend all scheduled provider appointments Call pharmacy for medication refills 3-7 days in advance of running out of medications Call provider office for new concerns or questions  Notify RN Care Manager of [facility] call rescheduling  needs Participate in Transition of Care Program/Attend [facility] scheduled calls Perform all self care activities independently  Take medications as prescribed    Plan:  Telephone follow up appointment with care management team member scheduled for:  Tuesday June 3rd at 11:00am        Patient verbalizes understanding of instructions and care plan provided today and agrees to view in MyChart. Active MyChart status and patient understanding of how to access instructions and care plan via MyChart confirmed with patient.     The patient has been provided with contact information for the care management team and has been advised to call with any health related questions or concerns.   Please call the care guide team at [phone] if you need to cancel or reschedule your appointment.   Please call the Suicide and Crisis Lifeline: 988 call the [place]  National Suicide Prevention Lifeline: (669) 361-3461 or TTY: 763-162-4901 TTY 716-331-8326) to talk to a trained counselor if you are experiencing a Mental Health or Behavioral Health Crisis or need someone to talk to.  Gareld June, BSN, RN [place]  [facility] - [employer] [facility] RN Care Manager [phone]

## 2023-08-31 NOTE — Transitions of Care (Post Inpatient/ED Visit) (Signed)
 08/31/2023  Name: Alexandria [name] MRN: [id number] DOB: 1945/11/02  Today's TOC FU Call Status: Today's TOC FU Call Status:: Successful TOC FU Call Completed TOC FU Call Complete Date: 08/31/23 Patient's Name and Date of Birth confirmed.  Transition Care Management Follow-up Telephone Call Date of Discharge: 08/28/23 Discharge Facility: [facility]) Type of Discharge: Inpatient Admission Primary Inpatient Discharge Diagnosis:: CAP How have you been since you were released from the hospital?: Better Any questions or concerns?: Yes Patient Questions/Concerns:: The patient feels that she needs a nebulizer Patient Questions/Concerns Addressed: Notified Provider of Patient Questions/Concerns  Items Reviewed: Did you receive and understand the discharge instructions provided?: Yes Medications obtained,verified, and reconciled?: Yes (Medications Reviewed) Any new allergies since your discharge?: No Dietary orders reviewed?: Yes Type of Diet Ordered:: Low sodium Heart Healthy Do you have support at home?: Yes People in Home [RPT]: child(ren), adult Name of Support/Comfort Primary Source: [name]  Medications Reviewed Today: Medications Reviewed Today     Reviewed by Claudene Crystal, RN (Case Manager) on 08/31/23 at 1426  Med List Status: <None>   Medication Order Taking? Sig Documenting Provider Last Dose Status Informant  albuterol  (VENTOLIN  HFA) 108 (90 Base) MCG/ACT inhaler [id number] No Inhale 2 puffs into the lungs every 6 (six) hours as needed (coughing). Trenton Frock, PA-C 08/25/2023 Active Self, Pharmacy Records  ALPRAZolam  (XANAX ) 0.5 MG tablet [id number]  Take 1 tablet (0.5 mg total) by mouth 2 (two) times daily as needed for anxiety. Ozell Blunt, MD  Active   amLODipine (NORVASC) 5 MG tablet [id number]  Take 1 tablet (5 mg total) by mouth daily. Ozell Blunt, MD  Active   amoxicillin  (AMOXIL ) 500 MG capsule [id number] No Take 2,000 mg by mouth as  directed. Take 1 hour prior to dental appointment. [provider] Unknown Active Self, Pharmacy Records  amoxicillin -clavulanate (AUGMENTIN ) 875-125 MG tablet [id number]  Take 1 tablet by mouth 2 (two) times daily for 5 days. Ozell Blunt, MD  Active   aspirin  EC 81 MG tablet [id number] No Take 81 mg by mouth 3 (three) times a week. Swallow whole. [provider] Unknown Active Self, Pharmacy Records           Med Note [facility], CARLOS A   Wed Jun 23, 2023  3:15 PM)    benzonatate  (TESSALON ) 200 MG capsule [id number]  Take 1 capsule (200 mg total) by mouth 3 (three) times daily as needed for cough. Ozell Blunt, MD  Active   diphenoxylate-atropine (LOMOTIL) 2.5-0.025 MG tablet [id number] No Take 2 tablets by mouth 3 (three) times daily as needed for diarrhea or loose stools. [provider] 08/24/2023 Active Self, Pharmacy Records  folic acid  (FOLVITE ) 400 MCG tablet [id number] No Take 1 tablet by mouth every other day. [provider] 08/23/2023 Active Self, Pharmacy Records  furosemide  (LASIX ) 20 MG tablet [id number] No Take 1 tablet (20 mg total) by mouth daily.  Patient taking differently: Take 20 mg by mouth daily as needed for edema or fluid.   Roel Clarity R, DO 08/22/2023 Active Self, Pharmacy Records  guaiFENesin  (MUCINEX ) 600 MG 12 hr tablet [id number]  Take 1 tablet (600 mg total) by mouth 2 (two) times daily for 5 days. Ozell Blunt, MD  Active   ipratropium-albuterol  (DUONEB) 0.5-2.5 (3) MG/3ML SOLN [id number] No Take 3 mLs by nebulization every 6 (six) hours as needed.  Patient not taking: Reported on 08/25/2023   Estill Hemming, DO Not  Taking Active Self, Pharmacy Records  levothyroxine  (SYNTHROID ) 112 MCG tablet [id number] No Take 112 mcg by mouth every other day. Take 125 mcg tablet on opposite days. [provider] 08/22/2023 Active Self, Pharmacy Records  levothyroxine  (SYNTHROID ) 125 MCG tablet [id number] No 1 PO MWF and 1/2  tab all other days  Patient taking differently: Take 125 mcg by mouth every other day. Take 112 mcg tablet on opposite days.   Roel Clarity R, DO 08/23/2023 Active Self, Pharmacy Records  losartan  (COZAAR ) 50 MG tablet [id number] No Take 50 mg by mouth daily. [provider] Unknown Active Self, Pharmacy Records  magnesium  oxide (MAG-OX) 400 MG tablet [id number] No Take 1 tablet (400 mg total) by mouth daily.  Patient not taking: Reported on 08/25/2023   Lamond Pilot, PA Not Taking Active Self, Pharmacy Records  Multiple Vitamin (MULTIVITAMIN WITH MINERALS) TABS tablet [id number] No Take 1 tablet by mouth once a week. [provider] Unknown Active Self, Pharmacy Records  mupirocin ointment (BACTROBAN) 2 % [id number] No Apply 1 Application topically 2 (two) times daily. [provider] 08/24/2023 Active Self, Pharmacy Records  OFEV  100 MG CAPS [id number] No Take 100 mg by mouth 2 (two) times daily. [provider] 08/22/2023 Active Self, Pharmacy Records  ondansetron  (ZOFRAN -ODT) 4 MG disintegrating tablet [id number] No TAKE 1 TABLET BY MOUTH EVERY 8 HOURS AS NEEDED FOR NAUSEA AND VOMITING  Patient taking differently: Take 4 mg by mouth as needed for nausea or vomiting.   Webb, Padonda B, FNP 08/24/2023 Active Self, Pharmacy Records  predniSONE  (DELTASONE ) 10 MG tablet [id number]  Prednisone  40 mg po daily x 1 day then Prednisone  30 mg po daily x 1 day then Prednisone  20 mg po daily x 1 day then Prednisone  10 mg daily x 1 day then stop... Ozell Blunt, MD  Active   promethazine -dextromethorphan (PROMETHAZINE -DM) 6.25-15 MG/5ML syrup [id number] No Take 5 mLs by mouth 4 (four) times daily as needed for cough. Roel Clarity R, DO 08/22/2023 Active Self, Pharmacy Records  vitamin C  (ASCORBIC ACID ) 500 MG tablet [id number] No Take 500 mg by mouth 2 (two) times a week. [provider] Unknown Active Self, Pharmacy Records            Home Care and  Equipment/Supplies: Were Home Health Services Ordered?: No Any new equipment or medical supplies ordered?: No (A nebulizer was suggested)  Functional Questionnaire: Do you need assistance with bathing/showering or dressing?: No Do you need assistance with meal preparation?: No Do you need assistance with eating?: No Do you have difficulty maintaining continence: Yes (fecal incontinence) Do you need assistance with getting out of bed/getting out of a chair/moving?: No Do you have difficulty managing or taking your medications?: No  Follow up appointments reviewed: PCP Follow-up appointment confirmed?: Yes Date of PCP follow-up appointment?: 09/06/23 Follow-up Provider: Roel Clarity Specialist [facility] Follow-up appointment confirmed?: NA Do you need transportation to your follow-up appointment?: No Do you understand care options if your condition(s) worsen?: Yes-patient verbalized understanding  SDOH Interventions Today    Flowsheet Row Most Recent Value  SDOH Interventions   Food Insecurity Interventions Intervention Not Indicated  Housing Interventions Intervention Not Indicated  Transportation Interventions Intervention Not Indicated  Utilities Interventions Intervention Not Indicated       Goals Addressed             This Visit's Progress    [facility] Transitions of Care (TOC) Care Plan       Problems:  Recent Hospitalization for treatment of Anxiety, CHF, and Pulmonary Disease Equipment/DME barrier Was not discharged home on a nebulizer or medication for ILD and Pneumonia  Goal:  Over the next 30 days, the patient will not experience hospital readmission  Interventions:   Heart Failure Interventions: Provided education on low sodium diet Advised patient to weigh each morning after emptying bladder Discussed importance of daily weight and advised patient to weigh and record daily Reviewed role of diuretics in prevention of fluid overload and management of heart  failure; Provided patient with education about the role of exercise in the management of heart failure Screening for signs and symptoms of depression related to chronic disease state   COPD Interventions: Advised patient to track and manage COPD triggers Discussed the importance of adequate rest and management of fatigue with COPD Provided education about and advised patient to utilize infection prevention strategies to reduce risk of respiratory infection Provided instruction about proper use of medications used for management of COPD including inhalers Screening for signs and symptoms of depression related to chronic disease state  Use of home nebulizer  Patient Self Care Activities:  Attend all scheduled provider appointments Call pharmacy for medication refills 3-7 days in advance of running out of medications Call provider office for new concerns or questions  Notify RN Care Manager of TOC call rescheduling needs Participate in Transition of Care Program/Attend [facility] scheduled calls Perform all self care activities independently  Take medications as prescribed    Plan:  Telephone follow up appointment with care management team member scheduled for:  Tuesday June 3rd at 11:00am       Gareld June, BSN, RN [place]  [facility] - [facility] RN Care Manager [phone]

## 2023-09-01 DIAGNOSIS — H35432 Paving stone degeneration of retina, left eye: Secondary | ICD-10-CM | POA: Diagnosis not present

## 2023-09-01 DIAGNOSIS — H43813 Vitreous degeneration, bilateral: Secondary | ICD-10-CM | POA: Diagnosis not present

## 2023-09-01 DIAGNOSIS — H31092 Other chorioretinal scars, left eye: Secondary | ICD-10-CM | POA: Diagnosis not present

## 2023-09-03 ENCOUNTER — Other Ambulatory Visit: Payer: Self-pay

## 2023-09-03 ENCOUNTER — Ambulatory Visit: Payer: Self-pay

## 2023-09-03 ENCOUNTER — Other Ambulatory Visit (INDEPENDENT_AMBULATORY_CARE_PROVIDER_SITE_OTHER)

## 2023-09-03 ENCOUNTER — Other Ambulatory Visit: Payer: Self-pay | Admitting: Family Medicine

## 2023-09-03 ENCOUNTER — Telehealth: Payer: Self-pay

## 2023-09-03 ENCOUNTER — Ambulatory Visit: Payer: Self-pay | Admitting: Family Medicine

## 2023-09-03 DIAGNOSIS — J84112 Idiopathic pulmonary fibrosis: Secondary | ICD-10-CM

## 2023-09-03 DIAGNOSIS — R197 Diarrhea, unspecified: Secondary | ICD-10-CM

## 2023-09-03 DIAGNOSIS — J449 Chronic obstructive pulmonary disease, unspecified: Secondary | ICD-10-CM

## 2023-09-03 LAB — CBC WITH DIFFERENTIAL/PLATELET
Absolute Lymphocytes: 2989 {cells}/uL (ref 850–3900)
Absolute Monocytes: 1428 {cells}/uL — ABNORMAL HIGH (ref 200–950)
Basophils Absolute: 133 {cells}/uL (ref 0–200)
Basophils Relative: 1.1 %
Eosinophils Absolute: 1670 {cells}/uL — ABNORMAL HIGH (ref 15–500)
Eosinophils Relative: 13.8 %
HCT: 41.3 % (ref 35.0–45.0)
Hemoglobin: 14 g/dL (ref 11.7–15.5)
MCH: 32.2 pg (ref 27.0–33.0)
MCHC: 33.9 g/dL (ref 32.0–36.0)
MCV: 94.9 fL (ref 80.0–100.0)
MPV: 9.5 fL (ref 7.5–12.5)
Monocytes Relative: 11.8 %
Neutro Abs: 5881 {cells}/uL (ref 1500–7800)
Neutrophils Relative %: 48.6 %
Platelets: 352 10*3/uL (ref 140–400)
RBC: 4.35 10*6/uL (ref 3.80–5.10)
RDW: 12.9 % (ref 11.0–15.0)
Total Lymphocyte: 24.7 %
WBC: 12.1 10*3/uL — ABNORMAL HIGH (ref 3.8–10.8)

## 2023-09-03 MED ORDER — IPRATROPIUM-ALBUTEROL 0.5-2.5 (3) MG/3ML IN SOLN: 3.0000 mL | Freq: Four times a day (QID) | RESPIRATORY_TRACT | 1 refills | Status: DC | PRN

## 2023-09-03 MED ORDER — FULL KIT NEBULIZER SET MISC: 0 refills | Status: AC

## 2023-09-03 MED ORDER — ALBUTEROL SULFATE (2.5 MG/3ML) 0.083% IN NEBU: 2.5000 mg | INHALATION_SOLUTION | Freq: Four times a day (QID) | RESPIRATORY_TRACT | 1 refills | Status: DC | PRN

## 2023-09-03 NOTE — Addendum Note (Signed)
 Addended by: Kanen Mottola C on: 09/03/2023 02:52 PM   Modules accepted: Orders

## 2023-09-03 NOTE — Addendum Note (Signed)
 Addended by: Ysidro Her on: 09/03/2023 03:41 PM   Modules accepted: Orders

## 2023-09-03 NOTE — Telephone Encounter (Signed)
 Patient was advised  of Dr. Crecencio Dodge message and verbalized understanding. Also Dr. Crecencio Dodge gave a verbal order to have a cbc lab today, 09/03/23. Patient was placed on the lab schedule for 3:00 PM today.

## 2023-09-03 NOTE — Telephone Encounter (Signed)
 Rx sent to pharmacy for neb machine

## 2023-09-03 NOTE — Telephone Encounter (Signed)
 Chief Complaint: Diarrhea Symptoms: loose stools Frequency: x 6 days Pertinent Negatives: Patient denies fever, abd pain Disposition: [] ED /[] Urgent Care (no appt availability in office) / [x] Appointment(In office/virtual)/ []  [place] [facility]/ [] Home Care/ [] Refused Recommended Disposition /[] [facility]/ []  Follow-up with PCP Additional Notes: Pt reports she has been experiencing ongoing "chocolate pudding" like stools x 6 days. She notes urgency with BM. 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 Pt has tried OTC Immodium with no improvement. Pt declines OV today as she has appt scheduled with PCP next week. Pt would like PCP recommendations. This RN educated pt on home care, new-worsening symptoms, when to call back/seek emergent care. Pt verbalized understanding and agrees to plan.    Copied From CRM [id number]. Reason for Triage: Patient experiencing diarrhea for 6 days. Imodium  not helping.   Reason for Disposition  [1] MODERATE diarrhea (e.g., 4-6 times / day more than normal) AND [2] present > 48 hours (2 days)  Answer Assessment - Initial Assessment Questions 1. DIARRHEA SEVERITY: "How bad is the diarrhea?" "How many more stools have you had in the past 24 hours than normal?"    - NO DIARRHEA (SCALE 0)   - MILD (SCALE 1-3): Few loose or mushy BMs; increase of 1-3 stools over normal daily number of stools; mild increase in ostomy output.   -  MODERATE (SCALE 4-7): Increase of 4-6 stools daily over normal; moderate increase in ostomy output.   -  SEVERE (SCALE 8-10; OR "WORST POSSIBLE"): Increase of 7 or more stools daily over normal; moderate increase in ostomy output; incontinence.     4-6 episodes 2. ONSET: "When did the diarrhea begin?"      X 6 days 3. BM CONSISTENCY: "How loose or watery is the diarrhea?"      "Looks like chocolate pudding" 4. VOMITING: "Are you also vomiting?" If Yes, ask: "How many times in the past 24 hours?"      None 5.  ABDOMEN PAIN: "Are you having any abdomen pain?" If Yes, ask: "What does it feel like?" (e.g., crampy, dull, intermittent, constant)      None 6. ABDOMEN PAIN SEVERITY: If present, ask: "How bad is the pain?"  (e.g., Scale 1-10; mild, moderate, or severe)   - MILD (1-3): doesn't interfere with normal activities, abdomen soft and not tender to touch    - MODERATE (4-7): interferes with normal activities or awakens from sleep, abdomen tender to touch    - SEVERE (8-10): excruciating pain, doubled over, unable to do any normal activities       None 7. ORAL INTAKE: If vomiting, "Have you been able to drink liquids?" "How much liquids have you had in the past 24 hours?"     Eating and drinking normally, increase fluids 8. HYDRATION: "Any signs of dehydration?" (e.g., dry mouth [not just dry lips], too weak to stand, dizziness, new weight loss) "When did you last urinate?"     None 10. ANTIBIOTIC USE: "Are you taking antibiotics now or have you taken antibiotics in the past 2 months?"       Yes, pt has been recently hospitalized 11. OTHER SYMPTOMS: "Do you have any other symptoms?" (e.g., fever, blood in stool)       None  Protocols used: Diarrhea-A-AH

## 2023-09-03 NOTE — Telephone Encounter (Signed)
 Copied from CRM [id number]. Topic: Clinical - Medication Question >> Sep 03, 2023  8:30 AM [place] E wrote: Reason for CRM: Patient called in regarding her ipratropium-albuterol  (DUONEB) 0.5-2.5 (3) MG/3ML SOLN. Patient was not sure of name of medication that is supposed to go into a nebulizer machine, but stated she does not have it and she thought PCP was going to send it to her house. Patient wanting clarification on this. Callback number [phone].

## 2023-09-03 NOTE — Telephone Encounter (Signed)
 Copied from CRM [id number]. Topic: Clinical - Medication Question >> Sep 03, 2023  3:51 PM Adonis Hoot wrote: Reason for CRM: Patient called in stating that Dr Crecencio Dodge advised her to get a nebulizer.However the pharmacy advised her that she needs a prescription for the nebulizer.  [id number] - [place], [place] - [address] AT [address]  Phone: [phone] Fax: [phone]   Also the pharmacy said that patients insurance doesn't cover the duoneb.

## 2023-09-03 NOTE — Telephone Encounter (Signed)
 Copied from CRM [id number]. Topic: Clinical - Prescription Issue >> Sep 03, 2023  4:25 PM Jenice Mitts wrote: Reason for CRM: Patient is calling with [facility] because they need an ICD 10 code sent over for the nebulizer and they also said a prescription is needed she cannot just request one

## 2023-09-03 NOTE — Addendum Note (Signed)
 Addended by: Jame Morrell C on: 09/03/2023 02:17 PM   Modules accepted: Orders

## 2023-09-05 ENCOUNTER — Ambulatory Visit: Payer: Self-pay | Admitting: Family Medicine

## 2023-09-06 ENCOUNTER — Ambulatory Visit (INDEPENDENT_AMBULATORY_CARE_PROVIDER_SITE_OTHER): Admitting: Family Medicine

## 2023-09-06 ENCOUNTER — Other Ambulatory Visit: Payer: Self-pay | Admitting: Family Medicine

## 2023-09-06 ENCOUNTER — Encounter: Payer: Self-pay | Admitting: Family Medicine

## 2023-09-06 VITALS — BP 149/78 | HR 84 | Temp 98.7°F | Resp 16 | Ht 64.0 in | Wt 135.0 lb

## 2023-09-06 DIAGNOSIS — R195 Other fecal abnormalities: Secondary | ICD-10-CM | POA: Diagnosis not present

## 2023-09-06 DIAGNOSIS — J84112 Idiopathic pulmonary fibrosis: Secondary | ICD-10-CM

## 2023-09-06 DIAGNOSIS — J189 Pneumonia, unspecified organism: Secondary | ICD-10-CM

## 2023-09-06 LAB — FECAL OCCULT BLOOD, IMMUNOCHEMICAL: Fecal Occult Bld: POSITIVE — AB

## 2023-09-06 NOTE — Progress Notes (Signed)
 Established Patient Office Visit  Subjective   Patient ID: Alexandria [name], female    DOB: Oct 24, 1945  Age: 78 y.o. MRN: [id number]  Chief Complaint  Patient presents with   Hospitalization Follow-up    HPI Discussed the use of AI scribe software for clinical note transcription with the patient, who gave verbal consent to proceed.  History of Present Illness Alexandria [name] "[name]" is a 78 year old female with interstitial pulmonary fibrosis who presents with respiratory issues and gastrointestinal concerns. She was d/c from hospital for CAP 08/28/23  She has been experiencing respiratory issues, including breathing difficulties and a persistent cough. She uses a breathing machine, although it requires replacement as it was not the one she wanted. A recent hospitalization was due to worsening symptoms, including pneumonia and sinus issues. She has difficulty calming herself during breathing problems, which exacerbates her symptoms. She continues to cough up white phlegm and experiences a cough that originates from her abdomen rather than her chest.  She is scheduled to see an ENT specialist for her sinus issues, as one side of her sinuses is obstructed, although the other side has opened up. She has been using saline to manage her symptoms.  She has been experiencing gastrointestinal issues, specifically passing stool when passing gas, which she describes as 'like chocolate pudding.' She has been taking Imodium  to manage these symptoms. She underwent a stool test recently, but the results are not yet available. She is concerned about the dark color of her stool, which is as 'black as my pants.'  A previous blood workup showed high white blood cell count and eosinophils. Prednisone  was effective in managing her symptoms. Currently, she is not on any medications from her recent hospital stay, except for cough medicine.   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 11/19/2022   Ileostomy care (HCC) 11/18/2022   Diaper candidiasis 09/28/2022   Ileostomy, has currently (HCC) 09/28/2022   At risk for dehydration 09/16/2022   At high risk for dehydration 09/14/2022   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   Dehydration 08/26/2022   Irritant contact dermatitis associated with digestive stoma 08/26/2022   Ileostomy in place [facility]) 08/26/2022   High output ileostomy (HCC) 08/25/2022   Diastolic dysfunction 08/14/2022   Chronic facial pain 08/14/2022   Dyspepsia 08/14/2022   Blowout of rectal stump (HCC) 08/14/2022   Diverticulitis of colon 08/13/2022   Colostomy care (HCC) 07/31/2022   Bronchospasm 06/16/2022   Temporal arteritis (HCC) 06/16/2022   Chronic obstructive pulmonary disease with (acute) lower respiratory infection (HCC) 06/16/2022   Angina pectoris with documented spasm (HCC) 06/16/2022   Abdominal aortic aneurysm (AAA) without rupture (HCC) 06/16/2022   Right ankle sprain 06/09/2022   Sprain of right ankle 06/01/2022   Dizziness 06/01/2022   Rectal abscess 06/01/2022   Acute right ankle pain 05/19/2022   History of low anterior resection of rectum 05/04/2022   Irritant contact dermatitis associated with fecal stoma 05/02/2022   Slow transit constipation 05/02/2022   Colostomy complication (HCC) 05/02/2022   IDA (iron  deficiency anemia) 04/19/2022   Intra-abdominal abscess (HCC) 04/18/2022  Acute blood loss anemia 04/17/2022   Chronic diastolic CHF (congestive heart failure) (HCC) 04/17/2022   Toxic metabolic encephalopathy  04/17/2022   Ileus following gastrointestinal surgery (HCC) 04/17/2022   Pelvic abscess in female 03/30/2022   Flat foot 11/19/2021   S/P total knee arthroplasty, left 11/13/2021   Ankle impingement syndrome, left 11/13/2021   Rectal bleeding 08/22/2021   Abnormal thyroid  blood test 08/22/2021   Abnormal kidney function 08/22/2021   Dumping syndrome 03/13/2020   Arthritis    Bronchial pneumonia    Colon polyp    DVT (deep venous thrombosis) (HCC)    Heart murmur    Migraines    Pancreatitis    Thyroid  disease    Aortic root dilatation (HCC) 11/24/2019   Leukocytosis 11/06/2019   Generalized abdominal pain 11/06/2019   Pre-ulcerative corn or callous 09/07/2019   Nausea 09/07/2019   Pain of left calf 05/09/2019   Tibial pain 05/09/2019   Essential hypertension 02/08/2019   Healthcare maintenance 11/10/2018   Bradycardia 07/22/2018   Pansinusitis 06/23/2018   History of transient ischemic attack (TIA) 03/24/2018   IPF (idiopathic pulmonary fibrosis) (HCC) 03/24/2018   Chronic rhinitis 02/16/2018   Eustachian tube dysfunction, bilateral 02/16/2018   URI (upper respiratory infection) 02/16/2018   Seasonal allergic rhinitis due to pollen 02/16/2018   ILD (interstitial lung disease) (HCC) 01/07/2018   Dyspnea 01/07/2018   Acute respiratory failure (HCC) 01/06/2018   TIA (transient ischemic attack) 01/06/2018   Hypokalemia 12/15/2017   Chronic cough 11/26/2017   Basal cell carcinoma (BCC) of neck 06/07/2017   Anisometropia 06/02/2017   Drusen of macula of both eyes 06/02/2017   Photopsia of left eye 06/02/2017   Cardiac murmur 05/07/2017   Situational anxiety 05/07/2017   Headache 05/07/2017   Mitral and aortic insufficiency 05/07/2017   Migraine without status migrainosus, not intractable 06/09/2016   Insomnia 11/18/2015   Normal coronary arteries 05/20/2015   RBBB 05/20/2015   Abnormal CT of the chest 05/20/2015   Hx of myocardial infarction 05/08/2015   Bruising  05/08/2015   Upper airway cough syndrome 02/14/2015   Asthmatic bronchitis with acute exacerbation 02/06/2015   Mild intermittent asthma without complication 02/06/2015   Oliguria 12/28/2014   Hypothyroidism 10/25/2014   Fatigue 10/25/2014   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   Chronic venous insufficiency 10/24/2014   Varicose veins of leg with complications 10/24/2014   Degeneration of intervertebral disc of cervical region 06/29/2014   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   Generalized osteoarthritis of multiple sites 06/29/2014   History of stroke without residual deficits 06/29/2014   Palpitations 11/08/2013   Edema of lower extremity 10/03/2013   Hair loss 11/25/2012   Alopecia 11/25/2012   Diverticulitis of large intestine with perforation and abscess 07/14/2012   Hammer toe 06/13/2012   History of stress test 05/21/2011   Hx of echocardiogram 01/27/2010   H/O cardiac catheterization 2004   Past Medical History:  Diagnosis Date   Arthritis    Bronchial pneumonia    Chronic facial pain 08/14/2022   Colon polyp    Diverticulitis    DVT (deep venous thrombosis) (HCC)    lower extremity   H/O cardiac catheterization 2004   Normal coronary arteries   Heart murmur    History of stress test 05/21/2011   Hx of echocardiogram 01/27/2010   Normal Ef 55% the transmitral spectral doppler flow pattern is normal for age. the left ventricular wall motion is normal   Hypothyroidism    IPF (idiopathic pulmonary fibrosis) (  HCC) 01/2018   Myocardial infarction (HCC)    hx of 30 years ago   Pancreatitis    TIA (transient ischemic attack)    8 years ago   Past Surgical History:  Procedure Laterality Date   BREAST BIOPSY Left    [facility]   CARDIAC CATHETERIZATION     10/2013   CATARACT EXTRACTION Bilateral 03/22/2018   FLEXIBLE SIGMOIDOSCOPY N/A 08/13/2022   Procedure: FLEXIBLE SIGMOIDOSCOPY;  Surgeon: Candyce Champagne, MD;  Location: [facility];  Service: General;   Laterality: N/A;   ILEOSTOMY CLOSURE N/A 11/19/2022   Procedure: OPEN TAKEDOWN OF LOOP ILEOSTOMY;  Surgeon: Candyce Champagne, MD;  Location: [facility];  Service: General;  Laterality: N/A;   LEFT HEART CATHETERIZATION WITH CORONARY ANGIOGRAM N/A 10/25/2013   Procedure: LEFT HEART CATHETERIZATION WITH CORONARY ANGIOGRAM;  Surgeon: Millicent Ally, MD;  Location: [facility] CATH LAB;  Service: Cardiovascular;  Laterality: N/A;   LYSIS OF ADHESION N/A 08/13/2022   Procedure: LYSIS OF ADHESION, DIVERTING ILEOSTOMY;  Surgeon: Candyce Champagne, MD;  Location: [facility];  Service: General;  Laterality: N/A;   NECK SURGERY     ACDF x 2      done in [place] more than 25 years, doesn't know levels   TOTAL KNEE ARTHROPLASTY     Bilateral x's 2   VAGINAL HYSTERECTOMY  10/17/1998   Danley Dusky   VIDEO BRONCHOSCOPY Bilateral 01/24/2018   Procedure: VIDEO BRONCHOSCOPY WITH FLUORO;  Surgeon: Marine Sia, MD;  Location[facility];  Service: Cardiopulmonary;  Laterality: Bilateral;   XI ROBOTIC ASSISTED COLOSTOMY TAKEDOWN N/A 08/13/2022   Procedure: ROBOTIC OSTOMY TAKEDOWN, SMALL BOWEL RESECTION X 2, BILATERAL TAP BLOCK, TISSUE PERFUSSION ASSESMENT VIA FIREFLY, MOBILAZATION OF SPLENIC FLEXURE;  Surgeon: Candyce Champagne, MD;  Location: [facility];  Service: General;  Laterality: N/A;   XI ROBOTIC ASSISTED LOWER ANTERIOR RESECTION N/A 03/31/2022   Procedure: XI ROBOTIC ASSISTED LOWER ANTERIOR RESECTION;  Surgeon: Candyce Champagne, MD;  Location: [facility];  Service: General;  Laterality: N/A;   Social History   Tobacco Use   Smoking status: Never   Smokeless tobacco: Never  Vaping Use   Vaping status: Never Used  Substance Use Topics   Alcohol  use: No    Alcohol /week: 0.0 standard drinks of alcohol    Drug use: No   Social History   Socioeconomic History   Marital status: Married    Spouse name: Cortland Ding   Number of children: 3   Years of education: Jr college   Highest education level: Not on file  Occupational History    Occupation: Leisure centre manager: UNEMPLOYED   Occupation: retired  Tobacco Use   Smoking status: Never   Smokeless tobacco: Never  Vaping Use   Vaping status: Never Used  Substance and Sexual Activity   Alcohol  use: No    Alcohol /week: 0.0 standard drinks of alcohol    Drug use: No   Sexual activity: Not Currently  Other Topics Concern   Not on file  Social History Narrative   Lives with husband Cortland Ding   Caffeine use: coffee (2 cups per day)   Mostly right-handed   Social Drivers of Corporate investment banker Strain: Not on file  Food Insecurity: No Food Insecurity (08/31/2023)   Hunger Vital Sign    Worried About Running Out of Food in the Last Year: Never true    Ran Out of Food in the Last Year: Never true  Transportation Needs: No Transportation Needs (08/31/2023)   PRAPARE -  Administrator, Civil Service (Medical): No    Lack of Transportation (Non-Medical): No  Physical Activity: Not on file  Stress: Not on file  Social Connections: Unknown (08/25/2023)   Social Connection and Isolation Panel [NHANES]    Frequency of Communication with Friends and Family: Patient declined    Frequency of Social Gatherings with Friends and Family: Patient declined    Attends Religious Services: Patient declined    Database administrator or Organizations: Patient declined    Attends Banker Meetings: Patient declined    Marital Status: Married  Catering manager Violence: Not At Risk (08/31/2023)   Humiliation, Afraid, Rape, and Kick questionnaire    Fear of Current or Ex-Partner: No    Emotionally Abused: No    Physically Abused: No    Sexually Abused: No   Family Status  Relation Name Status   Mother  Deceased   Father  Deceased   Brother  (Not Specified)   Brother  (Not Specified)   Brother  (Not Specified)   Brother  (Not Specified)   Brother  (Not Specified)   Mat Aunt  (Not Specified)   MGM  (Not Specified)   Other  (Not Specified)   Neg Hx   (Not Specified)  No partnership data on file   Family History  Problem Relation Age of Onset   Ovarian cancer Mother    Uterine cancer Mother    Lung cancer Father    HIV Brother        1982   Kidney cancer Brother    Lung cancer Brother    Other Brother        Mouth Cancer   Colon cancer Maternal Aunt    Heart disease Maternal Grandmother    Stroke Maternal Grandmother    Hypertension Maternal Grandmother    Diabetes Maternal Grandmother    Arthritis Other    Esophageal cancer Neg Hx    Liver disease Neg Hx    Rectal cancer Neg Hx    Stomach cancer Neg Hx       Review of Systems  Constitutional:  Negative for fever and malaise/fatigue.  HENT:  Negative for congestion.   Eyes:  Negative for blurred vision.  Respiratory:  Positive for cough and sputum production. Negative for shortness of breath.   Cardiovascular:  Negative for chest pain, palpitations and leg swelling.  Gastrointestinal:  Negative for abdominal pain, blood in stool, nausea and vomiting.  Genitourinary:  Negative for dysuria and frequency.  Musculoskeletal:  Negative for back pain and falls.  Skin:  Negative for rash.  Neurological:  Negative for dizziness, loss of consciousness and headaches.  Endo/Heme/Allergies:  Negative for environmental allergies.  Psychiatric/Behavioral:  Negative for depression. The patient is not nervous/anxious.       Objective:     BP (!) 149/78 (BP Location: Left Arm, Patient Position: Sitting, Cuff Size: Small)   Pulse 84   Temp 98.7 F (37.1 C) (Oral)   Resp 16   Ht 5\' 4"  (1.626 m)   Wt 135 lb (61.2 kg)   SpO2 95%   BMI 23.17 kg/m  BP Readings from Last 3 Encounters:  09/06/23 (!) 149/78  08/28/23 (!) 137/58  08/24/23 (!) 150/88   Wt Readings from Last 3 Encounters:  09/06/23 135 lb (61.2 kg)  08/31/23 138 lb (62.6 kg)  08/25/23 138 lb (62.6 kg)   SpO2 Readings from Last 3 Encounters:  09/06/23 95%  08/28/23 95%  08/24/23 Aaron Aas)  88%      Physical  Exam Vitals and nursing note reviewed.  Constitutional:      General: She is not in acute distress.    Appearance: Normal appearance. She is well-developed.  HENT:     Head: Normocephalic and atraumatic.  Eyes:     General: No scleral icterus.       Right eye: No discharge.        Left eye: No discharge.  Cardiovascular:     Rate and Rhythm: Normal rate and regular rhythm.     Heart sounds: No murmur heard. Pulmonary:     Effort: Pulmonary effort is normal. No respiratory distress.     Breath sounds: Normal breath sounds. Decreased air movement present.  Musculoskeletal:        General: Normal range of motion.     Cervical back: Normal range of motion and neck supple.     Right lower leg: No edema.     Left lower leg: No edema.  Skin:    General: Skin is warm and dry.  Neurological:     Mental Status: She is alert and oriented to person, place, and time.  Psychiatric:        Mood and Affect: Mood normal.        Behavior: Behavior normal.        Thought Content: Thought content normal.        Judgment: Judgment normal.      No results found for any visits on 09/06/23.  Last CBC Lab Results  Component Value Date   WBC 12.1 (H) 09/03/2023   HGB 14.0 09/03/2023   HCT 41.3 09/03/2023   MCV 94.9 09/03/2023   MCH 32.2 09/03/2023   RDW 12.9 09/03/2023   PLT 352 09/03/2023   Last metabolic panel Lab Results  Component Value Date   GLUCOSE 167 (H) 08/27/2023   NA 136 08/27/2023   K 4.1 08/27/2023   CL 100 08/27/2023   CO2 26 08/27/2023   BUN 15 08/27/2023   CREATININE 0.78 08/27/2023   GFRNONAA >60 08/27/2023   CALCIUM  9.3 08/27/2023   PHOS 2.4 (L) 04/21/2022   PROT 6.7 08/26/2023   ALBUMIN  3.1 (L) 08/26/2023   BILITOT 0.8 08/26/2023   ALKPHOS 81 08/26/2023   AST 20 08/26/2023   ALT 10 08/26/2023   ANIONGAP 10 08/27/2023   Last lipids Lab Results  Component Value Date   CHOL 177 12/04/2022   HDL 44 (L) 12/04/2022   LDLCALC 113 (H) 12/04/2022   TRIG 95  12/04/2022   CHOLHDL 4.0 12/04/2022   Last hemoglobin A1c Lab Results  Component Value Date   HGBA1C 5.4 04/05/2022   Last thyroid  functions Lab Results  Component Value Date   TSH 0.52 08/05/2023   T4TOTAL 11.0 08/22/2021   Last vitamin D  Lab Results  Component Value Date   VD25OH 29.9 (L) 05/09/2019   Last vitamin B12 and Folate Lab Results  Component Value Date   VITAMINB12 640 04/18/2022   FOLATE 10.8 04/18/2022      The ASCVD Risk score (Arnett DK, et al., 2019) failed to calculate for the following reasons:   Risk score cannot be calculated because patient has a medical history suggesting prior/existing ASCVD    Assessment & Plan:   Problem List Items Addressed This Visit       Unprioritized   IPF (idiopathic pulmonary fibrosis) (HCC)   Relevant Orders   Ambulatory referral to Pulmonology   CAP (community acquired pneumonia)  Other Visit Diagnoses       Change in consistency of stool    -  Primary   Relevant Orders   Ambulatory referral to Gastroenterology     Assessment and Plan Assessment & Plan Idiopathic pulmonary fibrosis (IPF)   She is dissatisfied with her current pulmonologist care for IPF and is considering management at the [facility]. Oxygen saturation is 96% post-exercise. Consider referral to [facility] and establish care with a local pulmonologist.  Pneumonia   She was recently hospitalized for pneumonia and shows improved symptoms, though a persistent cough with white sputum remains. No recent chest x-ray has been done. Improvement noted with prednisone . Monitor symptoms and consider a chest x-ray if symptoms worsen.  Chronic sinusitis   She has chronic sinusitis with a recent exacerbation. The right side is obstructed, while the left side has improved. She is using saline irrigation and has an ENT follow-up scheduled. Continue saline irrigation and follow up with ENT for further management.  Elevated eosinophils   Elevated  eosinophils are likely related to IPF or allergies. No immediate intervention is required. Monitor eosinophil levels.  Diarrhea   She experiences intermittent diarrhea with gas, described as chocolate pudding-like consistency. A recent stool test is pending. There are no signs of bleeding, and hemoglobin is stable. She is using Imodium  for symptom management. Await stool test results and refer to a gastroenterologist for further evaluation.    No follow-ups on file.    Gustave Lindeman R Lowne Chase, DO

## 2023-09-06 NOTE — Addendum Note (Signed)
 Addended by: Roel Clarity R on: 09/06/2023 05:30 PM   Modules accepted: Orders

## 2023-09-07 ENCOUNTER — Telehealth: Payer: Self-pay

## 2023-09-07 NOTE — Transitions of Care (Post Inpatient/ED Visit) (Signed)
 09/07/2023  Patient ID: Alexandria [name], female   DOB: Jun 14, 1945, 78 y.o.   MRN: [id number]  The patient answered the phone and stated that she had a lot going on and that she tried to cancel the appointment but couldn't do it through MyChart. She does not want a call this week. She did consent to a call next week. She is unsure if she wants to continue with TOC but will decide next week. Appointment scheduled.   Gareld June, BSN, RN [place]  [facility] - [facility] RN Care Manager [phone]

## 2023-09-07 NOTE — Telephone Encounter (Signed)
 Patient scheduled for labs 09/10/23

## 2023-09-09 ENCOUNTER — Telehealth: Payer: Self-pay

## 2023-09-09 ENCOUNTER — Other Ambulatory Visit: Payer: Self-pay | Admitting: Family Medicine

## 2023-09-09 ENCOUNTER — Other Ambulatory Visit (INDEPENDENT_AMBULATORY_CARE_PROVIDER_SITE_OTHER)

## 2023-09-09 ENCOUNTER — Encounter: Payer: Self-pay | Admitting: Family Medicine

## 2023-09-09 ENCOUNTER — Ambulatory Visit: Payer: Self-pay | Admitting: Family Medicine

## 2023-09-09 DIAGNOSIS — E039 Hypothyroidism, unspecified: Secondary | ICD-10-CM

## 2023-09-09 DIAGNOSIS — D649 Anemia, unspecified: Secondary | ICD-10-CM | POA: Diagnosis not present

## 2023-09-09 DIAGNOSIS — R195 Other fecal abnormalities: Secondary | ICD-10-CM

## 2023-09-09 LAB — CBC WITH DIFFERENTIAL/PLATELET
Basophils Absolute: 0.1 10*3/uL (ref 0.0–0.1)
Basophils Relative: 0.9 % (ref 0.0–3.0)
Eosinophils Absolute: 0.5 10*3/uL (ref 0.0–0.7)
Eosinophils Relative: 5.6 % — ABNORMAL HIGH (ref 0.0–5.0)
HCT: 38.2 % (ref 36.0–46.0)
Hemoglobin: 12.7 g/dL (ref 12.0–15.0)
Lymphocytes Relative: 15.2 % (ref 12.0–46.0)
Lymphs Abs: 1.3 10*3/uL (ref 0.7–4.0)
MCHC: 33.2 g/dL (ref 30.0–36.0)
MCV: 95.2 fl (ref 78.0–100.0)
Monocytes Absolute: 0.9 10*3/uL (ref 0.1–1.0)
Monocytes Relative: 10.8 % (ref 3.0–12.0)
Neutro Abs: 5.6 10*3/uL (ref 1.4–7.7)
Neutrophils Relative %: 67.5 % (ref 43.0–77.0)
Platelets: 275 10*3/uL (ref 150.0–400.0)
RBC: 4.01 Mil/uL (ref 3.87–5.11)
RDW: 14.4 % (ref 11.5–15.5)
WBC: 8.3 10*3/uL (ref 4.0–10.5)

## 2023-09-09 NOTE — Telephone Encounter (Signed)
 Copied from CRM [id number]. Topic: Clinical - Request for Lab/Test Order >> Sep 09, 2023  9:03 AM Alexandria [name] wrote: Reason for CRM: Patient wants to know if she can get her thyroid  labs done today as well when she comes into the lab, requesting orders to be placed

## 2023-09-10 ENCOUNTER — Other Ambulatory Visit: Payer: Self-pay

## 2023-09-10 ENCOUNTER — Other Ambulatory Visit

## 2023-09-10 ENCOUNTER — Telehealth: Payer: Self-pay

## 2023-09-10 LAB — THYROID PANEL WITH TSH
Free Thyroxine Index: 4.1 — ABNORMAL HIGH (ref 1.4–3.8)
T3 Uptake: 38 % — ABNORMAL HIGH (ref 22–35)
T4, Total: 10.7 ug/dL (ref 5.1–11.9)
TSH: 0.32 m[IU]/L — ABNORMAL LOW (ref 0.40–4.50)

## 2023-09-10 LAB — IRON,TIBC AND FERRITIN PANEL
%SAT: 22 % (ref 16–45)
Ferritin: 67 ng/mL (ref 16–288)
Iron: 57 ug/dL (ref 45–160)
TIBC: 256 ug/dL (ref 250–450)

## 2023-09-10 MED ORDER — LEVOTHYROXINE SODIUM 112 MCG PO TABS: 112.0000 ug | ORAL_TABLET | Freq: Every day | ORAL | 2 refills | Status: DC

## 2023-09-10 NOTE — Telephone Encounter (Signed)
 Pt called and advised messages have been sent and we will attempt to get her seen sooner but was advised that specialists are scheduling months out.

## 2023-09-10 NOTE — Telephone Encounter (Signed)
 Copied from CRM [id number]. Topic: Clinical - Medical Advice >> Sep 10, 2023  8:45 AM Alexandria [name] wrote: Reason for CRM: Pt is calling in again to speak to [facility] about getting a sooner appt for her since her levels are elevated. She is concerned because she has not heard back and is worried that something is wrong because the Dr. Has never asked her to get a sooner appt. And she is still unable to reach anyone to speak to her about this sooner appt.   Called the [facility] and found out Laila is not on site. Please have her or clinical staff call pt to confirm what Dr. Monalisa Angles like to be done and to help ease her concerns in regards to her labs and her appt.  Thank you so much  Patient call back [phone] okay to leave vm.

## 2023-09-10 NOTE — Telephone Encounter (Signed)
 Copied from CRM [id number]. Topic: Referral - Status >> Sep 09, 2023  5:12 PM [name] wrote: Reason for CRM: Patient calling to speak with Laila regarding getting a sooner appointment with GI.

## 2023-09-13 ENCOUNTER — Encounter: Payer: Self-pay | Admitting: Physician Assistant

## 2023-09-13 ENCOUNTER — Other Ambulatory Visit (INDEPENDENT_AMBULATORY_CARE_PROVIDER_SITE_OTHER)

## 2023-09-13 ENCOUNTER — Ambulatory Visit (INDEPENDENT_AMBULATORY_CARE_PROVIDER_SITE_OTHER): Admitting: Physician Assistant

## 2023-09-13 ENCOUNTER — Encounter: Payer: Self-pay | Admitting: Family Medicine

## 2023-09-13 ENCOUNTER — Ambulatory Visit (INDEPENDENT_AMBULATORY_CARE_PROVIDER_SITE_OTHER)
Admission: RE | Admit: 2023-09-13 | Discharge: 2023-09-13 | Disposition: A | Discharge status: Home / Self Care | Source: Ambulatory Visit | Attending: Physician Assistant | Admitting: Physician Assistant

## 2023-09-13 DIAGNOSIS — R197 Diarrhea, unspecified: Secondary | ICD-10-CM

## 2023-09-13 DIAGNOSIS — K625 Hemorrhage of anus and rectum: Secondary | ICD-10-CM

## 2023-09-13 DIAGNOSIS — K219 Gastro-esophageal reflux disease without esophagitis: Secondary | ICD-10-CM | POA: Diagnosis not present

## 2023-09-13 DIAGNOSIS — R140 Abdominal distension (gaseous): Secondary | ICD-10-CM | POA: Diagnosis not present

## 2023-09-13 DIAGNOSIS — R159 Full incontinence of feces: Secondary | ICD-10-CM

## 2023-09-13 DIAGNOSIS — Z8719 Personal history of other diseases of the digestive system: Secondary | ICD-10-CM | POA: Diagnosis not present

## 2023-09-13 DIAGNOSIS — K921 Melena: Secondary | ICD-10-CM

## 2023-09-13 LAB — COMPREHENSIVE METABOLIC PANEL WITH GFR
ALT: 10 U/L (ref 0–35)
AST: 16 U/L (ref 0–37)
Albumin: 4.2 g/dL (ref 3.5–5.2)
Alkaline Phosphatase: 87 U/L (ref 39–117)
BUN: 11 mg/dL (ref 6–23)
CO2: 30 meq/L (ref 19–32)
Calcium: 9.6 mg/dL (ref 8.4–10.5)
Chloride: 98 meq/L (ref 96–112)
Creatinine, Ser: 0.79 mg/dL (ref 0.40–1.20)
GFR: 71.48 mL/min (ref >60.00)
Glucose, Bld: 101 mg/dL — ABNORMAL HIGH (ref 70–99)
Potassium: 3.2 meq/L — ABNORMAL LOW (ref 3.5–5.1)
Sodium: 138 meq/L (ref 135–145)
Total Bilirubin: 0.6 mg/dL (ref 0.2–1.2)
Total Protein: 8.1 g/dL (ref 6.0–8.3)

## 2023-09-13 LAB — CBC WITH DIFFERENTIAL/PLATELET
Basophils Absolute: 0.1 10*3/uL (ref 0.0–0.1)
Basophils Relative: 1.1 % (ref 0.0–3.0)
Eosinophils Absolute: 0.4 10*3/uL (ref 0.0–0.7)
Eosinophils Relative: 6.1 % — ABNORMAL HIGH (ref 0.0–5.0)
HCT: 40.4 % (ref 36.0–46.0)
Hemoglobin: 13.4 g/dL (ref 12.0–15.0)
Lymphocytes Relative: 19.4 % (ref 12.0–46.0)
Lymphs Abs: 1.2 10*3/uL (ref 0.7–4.0)
MCHC: 33.2 g/dL (ref 30.0–36.0)
MCV: 95.5 fl (ref 78.0–100.0)
Monocytes Absolute: 0.7 10*3/uL (ref 0.1–1.0)
Monocytes Relative: 11.4 % (ref 3.0–12.0)
Neutro Abs: 3.8 10*3/uL (ref 1.4–7.7)
Neutrophils Relative %: 62 % (ref 43.0–77.0)
Platelets: 249 10*3/uL (ref 150.0–400.0)
RBC: 4.23 Mil/uL (ref 3.87–5.11)
RDW: 14.5 % (ref 11.5–15.5)
WBC: 6.2 10*3/uL (ref 4.0–10.5)

## 2023-09-13 LAB — SEDIMENTATION RATE: Sed Rate: 33 mm/h — ABNORMAL HIGH (ref 0–30)

## 2023-09-13 NOTE — Progress Notes (Addendum)
 02/21/2024 Alexandria [name] [id number] 02-Sep-1945  Referring provider: Antonio Meth, [name], * Primary GI doctor: Dr. Federico  ASSESSMENT AND PLAN:  Rectal bleeding  with history of robotic drainage pelvic abscess and Hartman rectosigmoid resection/colostomy 03/2022 with Dr. Sheldon subsequent diverting loop ileostomy takedown 11/19/2022 10/2021 colonoscopy chronic colitis sigmoid colon concerning for IBD 03/20/2022 flexible sigmoidoscopy for abnormal CT with rectal thickening congested mucosa sigmoid descending splenic flexure, diverticulosis sigmoid colon, partially obstructing tumor rectosigmoid colon, nonbleeding internal hemorrhoids, anal fissure pathology showed inflammatory polyp with possible ischemia 03/2022 MR pelvis showed 3.6 cm multiloculated fluid collection left perirectal and presacral region favoring diverticulitis and diverticular abscess 03/31/2022 status post robotic lower anterior rectosigmoid resection with colostomy drainage of pelvic abscess with Dr. Sheldon 08/13/2022 colostomy for colon resection and ostomy takedown with Dr. 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 SCAD with previous colon concerning for colitis, consider repeat flex sig/colonoscopy in the furture, nopt needed at this time  COPD with idiopathic pulmonary fibrosis Follows with  [facility] Not on oxygen  CAD  CVA/TIA    Patient Care Team: Antonio Meth, [name], DO as PCP - General (Family Medicine) Croitoru, Jerel, MD as PCP - Cardiology (Cardiology) Tobb, Kardie, DO as Consulting Physician (Cardiology) Federico Rosario BROCKS, MD as Consulting Physician (Gastroenterology) Sheldon Standing, MD as Consulting Physician (General Surgery) Reida Johana Jansky, MD (Otolaryngology) Francyne Jerel, MD as Consulting Physician (Cardiology) Lequita Evalene LABOR, MD as Consulting Physician (Obstetrics and Gynecology)  HISTORY OF PRESENT ILLNESS: 78 y.o. female with a past medical history listed below presents for evaluation of blood in stool.   Last seen 03/2022 by Dr. Federico in our office for abnormal CT scan with thickening in the rectum.  Found to have perforated diverticulitis with partially contained abscess status post takedown of diverting loop ileostomy after urgent Hartmann resection for chronic colonic obstruction with prolonged hospital recovery.  Last seen by Dr. Sheldon 05/31/2023 where patient had unremarkable CT abdomen pelvis, possibility of lower anterior resection syndrome with presentation on fiber.  Discussed the use of AI scribe software for clinical note transcription with the patient, who gave verbal consent to proceed.  History of Present Illness   Alexandria [name] is a 78 year old female who presents with diarrhea and dark stools. She is accompanied by her husband. She was referred by Dr. Antonio for evaluation of low hemoglobin and gastrointestinal symptoms.  She has a history of colon surgery, including a colonoscopy in 2023, a CT scan showing a possible mass, and subsequent surgeries including a flexible sigmoidoscopy and ileostomy. An ileostomy takedown was performed in August 2024, but she has been experiencing diarrhea and dark stools since then. She wears sanitary napkins due to leakage and takes Lomotil  six times a day and Metamucil gummies,  which have not been effective.  Her hemoglobin was found to be low, prompting further evaluation.  Iron  levels have fluctuated, with a recent check showing iron  at 57, saturation at 22, and ferritin at 67. She is not currently on iron  supplements.  She experiences diarrhea with dark, thick stools, especially when passing gas. No bright red blood is seen, but the stool is described as black. She is not on iron  or Pepto Bismol but takes papaya pills and occasionally Aleve  for stomach discomfort.  No nausea or vomiting, but she reports a sour stomach and occasional shakes. She has no appetite but can eat small amounts of food like shrimp and vegetables prepared by her daughter. She has lost two pounds recently and reports no trouble swallowing.  She experiences night sweats and sometimes wakes up feeling sweaty. Bowel movements vary from none to three times a day, with loose stools described as 'nicely caked like a patty'. Leakage occurs when passing gas.  She has a history of constipation but now experiences frequent diarrhea. No steroid use or lung issues requiring oxygen. She follows with [facility] but is not on any specific lung medications.      She  reports that she has never smoked. She has never used smokeless tobacco. She reports that she does not drink alcohol  and does not use drugs.  RELEVANT GI HISTORY, IMAGING AND LABS: Results   LABS Iron : 44 (12/04/2022) Ferritin: 136 (12/04/2022) Iron : 79 (01/06/2023) Saturation: 33% (01/06/2023) Ferritin: 103 (01/06/2023) Iron : 57 (09/09/2023) Saturation: 22% (09/09/2023) Ferritin: 67 (09/09/2023) Hemoccult: Positive  RADIOLOGY CT scan: Lower lobe pneumonia, abdomen normal (06/2023)      CBC    Component Value Date/Time   WBC 8.9 01/29/2024 0247   RBC 4.14 01/29/2024 0247   HGB 13.0 01/29/2024 0247   HGB 13.6 06/23/2023 1653   HCT 40.2 01/29/2024 0247   HCT 40.2 06/23/2023 1653   PLT 249 01/29/2024 0247   PLT 208 06/23/2023  1653   MCV 97.1 01/29/2024 0247   MCV 94 06/23/2023 1653   MCH 31.4 01/29/2024 0247   MCHC 32.3 01/29/2024 0247   RDW 14.3 01/29/2024 0247   RDW 12.5 06/23/2023 1653   LYMPHSABS 1.3 01/24/2024 1529   LYMPHSABS 1.6 03/21/2021 1214   MONOABS 1.0 01/24/2024 1529   EOSABS 0.5 01/24/2024 1529   EOSABS 0.5 (H) 03/21/2021 1214   BASOSABS 0.1 01/24/2024 1529   BASOSABS 0.1 03/21/2021 1214   Recent Labs    08/27/23 0412 09/03/23 1422 09/09/23 1330 09/13/23 1429 09/26/23 2214 09/26/23 2219 09/30/23 1316 01/24/24 1529 01/25/24 0218 01/29/24 0247  HGB 13.9 14.0 12.7 13.4 13.7 13.6 14.3 14.3 12.8 13.0    CMP     Component Value Date/Time   NA 134 (L) 01/29/2024 0247   NA 138 12/07/2023 1205   K 3.5 01/29/2024 0247   CL 91 (L) 01/29/2024 0247   CO2 32 01/29/2024 0247   GLUCOSE 124 (H) 01/29/2024 0247   BUN 12 01/29/2024 0247   BUN 14 12/07/2023 1205   CREATININE 1.02 (H) 01/29/2024 0247   CREATININE 0.80 04/23/2023 1343   CALCIUM  8.9 01/29/2024 0247   PROT 6.7 01/29/2024 0247   ALBUMIN  3.0 (L) 01/29/2024 0247   AST 20 01/29/2024 0247   ALT 9 01/29/2024 0247   ALKPHOS 59 01/29/2024 0247   BILITOT 0.9 01/29/2024 0247   GFRNONAA 56 (L) 01/29/2024 0247   GFRAA 85 05/24/2020 1006      Latest Ref Rng & Units 01/29/2024    2:47 AM 01/24/2024    3:29 PM 09/30/2023    1:16 PM  Hepatic Function  Total Protein 6.5 - 8.1 g/dL 6.7  7.8  7.4   Albumin  3.5 - 5.0 g/dL 3.0  4.1  3.5   AST 15 - 41 U/L 20  22  20    ALT 0 - 44 U/L 9  6  11    Alk Phosphatase 38 - 126 U/L 59  96  64   Total Bilirubin 0.0 - 1.2 mg/dL 0.9  1.0  0.9       Current Medications:   Current Outpatient Medications (Endocrine & Metabolic):    SYNTHROID  112 MCG tablet, Take 1 tablet (112 mcg total) by mouth daily before breakfast.  Current Outpatient Medications (Cardiovascular):    furosemide  (LASIX ) 40 MG tablet, Take 1 tablet (40 mg total) by mouth daily.   rosuvastatin (CRESTOR) 20 MG tablet, Take 1  tablet (20 mg total) by mouth daily.   spironolactone  (ALDACTONE ) 25 MG tablet, Take 1 tablet (25 mg total) by mouth daily.  Current Outpatient Medications (Respiratory):    levalbuterol  (XOPENEX ) 1.25 MG/3ML nebulizer solution, Take 1.25 mg by nebulization every 6 (six) hours as needed for wheezing or shortness of breath.   azelastine  (ASTELIN ) 0.1 % nasal spray, Place 1 spray into both nostrils daily as needed for rhinitis. Use in each nostril as directed   benzonatate  (TESSALON ) 200 MG capsule, Take 1 capsule (200 mg total) by mouth 3 (three) times daily as needed for cough.   promethazine -dextromethorphan (PROMETHAZINE -DM) 6.25-15 MG/5ML syrup, TAKE BY MOUTH 4 TIMES A DAY AS NEEDED    Current Outpatient Medications (Other):    Multiple Vitamin (MULTIVITAMIN WITH MINERALS) TABS tablet, Take 1 tablet by mouth every Monday, Wednesday, and Friday.   Respiratory Therapy Supplies (FULL KIT NEBULIZER SET) MISC, Use with DUONEB for COPD treatment   vitamin C  (ASCORBIC ACID ) 500 MG tablet, Take 500 mg by mouth every other day.   amoxicillin -clavulanate (AUGMENTIN ) 875-125 MG tablet, Take 1 tablet by mouth 2 (two) times daily.   diphenoxylate -atropine  (LOMOTIL ) 2.5-0.025 MG tablet, Take 2 tablets by mouth 3 (three) times daily as needed for diarrhea or loose stools.   esomeprazole  (NEXIUM ) 40 MG capsule, Take 1 capsule (40 mg total) by mouth daily at 12 noon.   mirtazapine (REMERON SOL-TAB) 15 MG disintegrating tablet, Take 1 tablet (15 mg total) by mouth at bedtime.   NONFORMULARY OR COMPOUNDED ITEM, Portable oxygen via [place] 2L Dx hypoxia, pulnonary fibrosis   ondansetron  (ZOFRAN ) 4 MG tablet, Take 1 tablet (4 mg total) by mouth every 8 (eight) hours as needed.   OXYGEN, Inhale 2 L/min into the lungs continuous.   potassium chloride  SA (KLOR-CON  M) 20 MEQ tablet, Take 1 tablet (20 mEq total) by mouth daily.  Medical History:  Past Medical History:  Diagnosis Date   Allergy     Arthritis     Blood transfusion without reported diagnosis    Bronchial pneumonia    Cataract    Chronic facial pain 08/14/2022   Colon polyp    Diverticulitis    DVT (deep venous thrombosis) (HCC)    lower extremity   H/O cardiac catheterization 2004   Normal coronary arteries   Heart murmur    History of stress test 05/21/2011   Hx of echocardiogram 01/27/2010   Normal Ef 55% the transmitral spectral doppler flow pattern is normal for age. the left ventricular wall motion is normal   Hypothyroidism    IPF (idiopathic pulmonary fibrosis) (HCC) 01/2018   Myocardial infarction (HCC)    hx of 30  years ago   Pancreatitis    TIA (transient ischemic attack)    8 years ago   Allergies:  Allergies  Allergen Reactions   Influenza Vaccines Other (See Comments)    Pt reports heart attack after flu shot   Prednisone  Other (See Comments)    High doses caused mental status changes.   Dilaudid  [Hydromorphone ] Itching and Other (See Comments)    Dilaudid  caused marked confusion   Ipratropium Bromide  Itching and Other (See Comments)    Mental Status Changes, Insomnia   Cheratussin Ac [Guaifenesin -Codeine ] Other (See Comments)    Headaches - patient denies   Codeine  Itching, Nausea Only and Other (See Comments)    Pt takes promethazine  with codeine  at home   Diltiazem  Nausea And Vomiting   Hydrocodone Itching   Ibuprofen  Itching and Other (See Comments)    Gives false reading in blood   Morphine  Itching   Oxycodone  Itching     Surgical History:  She  has a past surgical history that includes Vaginal hysterectomy (10/17/1998); Breast biopsy (Left); Total knee arthroplasty; Neck surgery; Cardiac catheterization; left heart catheterization with coronary angiogram (N/A, 10/25/2013); Video bronchoscopy (Bilateral, 01/24/2018); Cataract extraction (Bilateral, 03/22/2018); XI robotic assisted lower anterior resection (N/A, 03/31/2022); Xi robotic assisted colostomy takedown (N/A, 08/13/2022); Lysis of  adhesion (N/A, 08/13/2022); Flexible sigmoidoscopy (N/A, 08/13/2022); Ileostomy closure (N/A, 11/19/2022); and Rhinoplasty (08/16/2023). Family History:  Her family history includes Arthritis in an other family member; Colon cancer in her maternal aunt; Diabetes in her maternal grandmother; HIV in her brother; Heart disease in her maternal grandmother; Hypertension in her maternal grandmother; Kidney cancer in her brother; Lung cancer in her brother and father; Other in her brother; Ovarian cancer in her mother; Stroke in her maternal grandmother; Uterine cancer in her mother.  REVIEW OF SYSTEMS  : All other systems reviewed and negative except where noted in the History of Present Illness.  PHYSICAL EXAM: BP (!) 140/74   Pulse 81   Ht 5' 4 (1.626 m)   Wt 133 lb 6 oz (60.5 kg)   BMI 22.89 kg/m  Physical Exam   VITALS: SaO2- 97% MEASUREMENTS: Weight- 133. GENERAL APPEARANCE: Well nourished, in no apparent distress. HEENT: No cervical lymphadenopathy, unremarkable thyroid , sclerae anicteric, conjunctiva pink. RESPIRATORY: Respiratory effort normal, breath sounds equal bilaterally without rales, rhonchi, or wheezing. CARDIO: Regular rate and rhythm with no murmurs, rubs, or gallops, peripheral pulses intact. ABDOMEN: Soft, non-distended, active bowel sounds in all four quadrants, no tenderness to palpation, no rebound, no mass appreciated. RECTAL: Rectal tone decreased, loose stool present, no fissures, internal hemorrhoids present. MUSCULOSKELETAL: Full range of motion, normal gait, without edema. SKIN: Dry, intact without rashes or lesions. No jaundice. NEURO: Alert, oriented, no focal deficits. PSYCH: Cooperative, normal mood and affect.      Alan JONELLE Coombs, PA-C 8:28 AM

## 2023-09-13 NOTE — Progress Notes (Signed)
 I agree with the assessment and plan as outlined by Ms. Steffanie Dunn.

## 2023-09-13 NOTE — Patient Instructions (Addendum)
 _______________________________________________________  If your blood pressure at your visit was 140/90 or greater, please contact your primary care physician to follow up on this.  _______________________________________________________  If you are age 79 or older, your body mass index should be between 23-30. Your Body mass index is 22.89 kg/m. If this is out of the aforementioned range listed, please consider follow up with your Primary Care Provider.  If you are age 1 or younger, your body mass index should be between 19-25. Your Body mass index is 22.89 kg/m. If this is out of the aformentioned range listed, please consider follow up with your Primary Care Provider.   ________________________________________________________  The [facility] providers would like to encourage you to use MYCHART to communicate with providers for non-urgent requests or questions.  Due to long hold times on the telephone, sending your provider a message by [facility] may be a faster and more efficient way to get a response.  Please allow 48 business hours for a response.  Please remember that this is for non-urgent requests.  _______________________________________________________  Alexandria [name] have been scheduled for an endoscopy. Please follow written instructions given to you at your visit today.  If you use inhalers (even only as needed), please bring them with you on the day of your procedure.  If you take any of the following medications, they will need to be adjusted prior to your procedure:   DO NOT TAKE 7 DAYS PRIOR TO TEST- Trulicity (dulaglutide) Ozempic, Wegovy (semaglutide) Mounjaro (tirzepatide) Bydureon Bcise (exanatide extended release)  DO NOT TAKE 1 DAY PRIOR TO YOUR TEST Rybelsus (semaglutide) Adlyxin (lixisenatide) Victoza (liraglutide) Byetta (exanatide) ___________________________________________________________________________  Your provider has requested that you go to the basement  level for lab work before leaving today. Press "B" on the elevator. The lab is located at the first door on the left as you exit the elevator.  Your provider has requested that you have an abdominal x ray before leaving today. Please go to the basement floor to our Radiology department for the test.  Due to recent changes in healthcare laws, you may see the results of your imaging and laboratory studies on MyChart before your provider has had a chance to review them.  We understand that in some cases there may be results that are confusing or concerning to you. Not all laboratory results come back in the same time frame and the provider may be waiting for multiple results in order to interpret others.  Please give us  48 hours in order for your provider to thoroughly review all the results before contacting the office for clarification of your results.   VISIT SUMMARY:  Today, you were seen for diarrhea and dark stools. We discussed your history of colon surgery and recent gastrointestinal symptoms. Your hemoglobin levels were low, and we reviewed your iron  levels and current symptoms.  YOUR PLAN:  -POST-SURGICAL DIARRHEA AND FECAL INCONTINENCE: You are experiencing chronic diarrhea and fecal incontinence following your ileostomy takedown and small bowel resection. We will test for a possible C. difficile infection and switch your fiber supplement from Metamucil to Benefiber or Citrucel, to be taken three times a day. Please avoid ice cream and other milk products. An abdominal x-ray will be done to check for gas.  -POSSIBLE UPPER GASTROINTESTINAL BLEEDING: Your dark stools and upper gastrointestinal symptoms may indicate bleeding, possibly from a peptic ulcer. We will perform an endoscopy to investigate further.  -INTERNAL HEMORRHOIDS: You have internal hemorrhoids, but there is no active bleeding at this  time.  -IRON  DEFICIENCY ANEMIA: Your previous iron  deficiency anemia has resolved, and your  current iron  levels are normal.  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  INSTRUCTIONS:  Please follow up with the stool test for C. difficile, the abdominal x-ray, and the endoscopy as ordered. Avoid ice cream and other milk products, and switch to Benefiber or Citrucel three times a day. If you have any new or worsening symptoms, contact our office.   First do a trial off milk/lactose products if you use them.  Add fiber like benefiber or citracel three times a day,    FODMAP stands for fermentable oligo-, di-, mono-saccharides and polyols (1). These are the scientific terms used to classify groups of carbs that are difficult for our body to digest and that are notorious for triggering digestive symptoms like bloating, gas, loose stools and stomach pain.   You can try low FODMAP diet  - start with eliminating just one column at a time that you feel may be a trigger for you. - the table at the very bottom contains foods that are low in FODMAPs   Sometimes trying to eliminate the FODMAP's from your diet is difficult or tricky, if you are stuggling with trying to do the elimination diet you can try an enzyme.  There is a food enzymes that you sprinkle in or on your food that helps break down the FODMAP. You can read more about the enzyme by going to this site: https://fodzyme.com/

## 2023-09-14 ENCOUNTER — Ambulatory Visit: Payer: Self-pay

## 2023-09-14 ENCOUNTER — Ambulatory Visit (HOSPITAL_BASED_OUTPATIENT_CLINIC_OR_DEPARTMENT_OTHER)
Admission: RE | Admit: 2023-09-14 | Discharge: 2023-09-14 | Disposition: A | Discharge status: Home / Self Care | Source: Ambulatory Visit | Attending: Family | Admitting: Family

## 2023-09-14 ENCOUNTER — Ambulatory Visit: Payer: Self-pay | Admitting: Family

## 2023-09-14 ENCOUNTER — Telehealth (INDEPENDENT_AMBULATORY_CARE_PROVIDER_SITE_OTHER): Admitting: Family

## 2023-09-14 ENCOUNTER — Telehealth: Payer: Self-pay

## 2023-09-14 ENCOUNTER — Ambulatory Visit: Payer: Self-pay | Admitting: Physician Assistant

## 2023-09-14 ENCOUNTER — Telehealth: Payer: Self-pay | Admitting: Physician Assistant

## 2023-09-14 DIAGNOSIS — R159 Full incontinence of feces: Secondary | ICD-10-CM

## 2023-09-14 DIAGNOSIS — Z8701 Personal history of pneumonia (recurrent): Secondary | ICD-10-CM

## 2023-09-14 DIAGNOSIS — E876 Hypokalemia: Secondary | ICD-10-CM

## 2023-09-14 DIAGNOSIS — J849 Interstitial pulmonary disease, unspecified: Secondary | ICD-10-CM | POA: Diagnosis not present

## 2023-09-14 MED ORDER — POTASSIUM CHLORIDE CRYS ER 20 MEQ PO TBCR: 20.0000 meq | EXTENDED_RELEASE_TABLET | Freq: Every day | ORAL | 0 refills | Status: DC

## 2023-09-14 NOTE — Telephone Encounter (Signed)
 PT called to speak with a nurse to find out a regimen of medication she can take for her diarrhea. She stated she is on so much medication that she just doesn't know what to take any longer. Please advise.

## 2023-09-14 NOTE — Telephone Encounter (Signed)
 FYI Only or Action Required?: FYI only for provider  Patient was last seen in primary care on 09/06/2023 by Crecencio Dodge, Candida Chalk, DO. Called Nurse Triage reporting Fever, Sinusitis, and Headache. Symptoms began several days ago. Interventions attempted: OTC medications: Mucinex  and nasal spray. Symptoms are: gradually worsening.  Triage Disposition: See Physician Within 24 Hours  Patient/caregiver understands and will follow disposition?: Yes                                  Copied From CRM [id number]. Reason for Triage: Fever 101.2, sinus drainage, headache.   [name] [phone]  Reason for Disposition  [1] Sinus pain (not just congestion) AND [2] fever  Answer Assessment - Initial Assessment Questions 1. LOCATION: "Where does it hurt?"      Under eyes and along cheekbones 2. ONSET: "When did the sinus pain start?"  (e.g., hours, days)      Sinus pain started a few days ago, fever started last night  3. SEVERITY: "How bad is the pain?"   (Scale 1-10; mild, moderate or severe)   - MILD (1-3): doesn't interfere with normal activities    - MODERATE (4-7): interferes with normal activities (e.g., work or school) or awakens from sleep   - SEVERE (8-10): excruciating pain and patient unable to do any normal activities        Rates pain an "11" 4. RECURRENT SYMPTOM: "Have you ever had sinus problems before?" If Yes, ask: "When was the last time?" and "What happened that time?"      Yes 5. NASAL CONGESTION: "Is the nose blocked?" If Yes, ask: "Can you open it or must you breathe through your mouth?"     Denies difficulty breathing 6. NASAL DISCHARGE: "Do you have discharge from your nose?" If so ask, "What color?"     Clear  7. FEVER: "Do you have a fever?" If Yes, ask: "What is it, how was it measured, and when did it start?"      Yes, 101.2 8. OTHER SYMPTOMS: "Do you have any other symptoms?" (e.g., sore throat, cough, earache, difficulty breathing)      Nausea, deep cough, denies sore throat  Mucinex  and nasal spray  Protocols used: Sinus Pain or Congestion-A-AH

## 2023-09-14 NOTE — Telephone Encounter (Signed)
 Received message today from Radiology tech that patient was having respiratory distress walking back out to her car.   I advised the tech to bring patient to ED for further evaluation.  Pt declined because she has a funeral to go to tomorrow. Pt stated she would return if symptoms worsened.  CXR shows chronic changes- no definite pneumonia.  Please contact pt to schedule an in office visit this week with myself or Dr. Crecencio Dodge.

## 2023-09-14 NOTE — Progress Notes (Unsigned)
 MyChart Video Visit    Virtual Visit via Video Note    Patient location: Home. Patient and provider in visit Provider location: Office  I discussed the limitations of evaluation and management by telemedicine and the availability of in person appointments. The patient expressed understanding and agreed to proceed.  Visit Date: 09/14/2023  Today's healthcare provider: Rochele Christmas, NP     Subjective:    Patient ID: Alexandria [name], female    DOB: 25-Dec-1945, 78 y.o.   MRN: [id number]  Chief Complaint  Patient presents with   Nasal Congestion    Complains of nasal congestion with post nasal drip   Sinus Problem    Complains of sinus pain and pressure   Fever    Complains of temperature of 101.2 yesterday    HPI  Alexandria [name] is a 78 year old female who presents with sinus drainage and fever.  She has experienced sinus drainage and fever for the past few days, with temperatures reaching 100.24F last night and this morning. Symptoms are intermittent, causing significant discomfort and frustration. She takes Mucinex  and aspirin  without significant improvement. She has not performed a COVID test and has limited exposure to others, with her only recent outing being to a grocery store.  Approximately two and a half weeks ago, she was hospitalized and treated for acute hypoxic respiratory failure/community acquired pneumonia with ceftriaxone  and azithromycin , followed by Augmentin  for five days. She feels fatigued and frustrated with her ongoing health issues, noting a delay in her usual daily routine.  She has a non-productive cough and uses a prescription cough medicine, taken last night around 2 AM, though she is unsure of the name. Her oxygen level was 97% this morning.  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 the left ventricular wall motion is normal   Hypothyroidism    IPF (idiopathic pulmonary fibrosis) (HCC) 01/2018   Myocardial infarction (HCC)    hx of 30 years ago   Pancreatitis    TIA (transient ischemic attack)    8 years ago    Past Surgical History:  Procedure Laterality Date   BREAST BIOPSY Left    [facility]   CARDIAC CATHETERIZATION     10/2013   CATARACT EXTRACTION Bilateral 03/22/2018   FLEXIBLE SIGMOIDOSCOPY N/A 08/13/2022   Procedure: FLEXIBLE SIGMOIDOSCOPY;  Surgeon: Candyce Champagne, MD;  Location: [facility];  Service: General;  Laterality: N/A;   ILEOSTOMY CLOSURE N/A 11/19/2022   Procedure: OPEN TAKEDOWN OF LOOP ILEOSTOMY;  Surgeon: Candyce Champagne, MD;  Location: [facility];  Service: General;  Laterality: N/A;   LEFT HEART CATHETERIZATION WITH CORONARY ANGIOGRAM N/A 10/25/2013   Procedure: LEFT HEART CATHETERIZATION WITH CORONARY ANGIOGRAM;  Surgeon: Millicent Ally, MD;  Location: [facility] C[facility];  Service: Cardiovascular;  Laterality: N/A;   LYSIS OF ADHESION N/A 08/13/2022   Procedure: LYSIS OF ADHESION, DIVERTING ILEOSTOMY;  Surgeon: Candyce Champagne, MD;  Location: [facility];  Service: General;  Laterality: N/A;   NECK SURGERY     ACDF  x 2      done in [place] more than 25 years, doesn't know levels   RHINOPLASTY  08/16/2023   uses A[place] in her nostril   TOTAL KNEE ARTHROPLASTY     Bilateral x's 2   VAGINAL HYSTERECTOMY  10/17/1998   Ron Andree Kayser   VIDEO BRONCHOSCOPY Bilateral 01/24/2018   Procedure: VIDEO BRONCHOSCOPY WITH FLUORO;  Surgeon: Marine Sia, MD;  Location: [facility] ENDOSCOPY;  Service: Cardiopulmonary;  Laterality: Bilateral;   XI ROBOTIC ASSISTED COLOSTOMY TAKEDOWN N/A 08/13/2022   Procedure: ROBOTIC OSTOMY  TAKEDOWN, SMALL BOWEL RESECTION X 2, BILATERAL TAP BLOCK, TISSUE PERFUSSION ASSESMENT VIA FIREFLY, MOBILAZATION OF SPLENIC FLEXURE;  Surgeon: Candyce Champagne, MD;  Location: [facility];  Service: General;  Laterality: N/A;   XI ROBOTIC ASSISTED LOWER ANTERIOR RESECTION N/A 03/31/2022   Procedure: XI ROBOTIC ASSISTED LOWER ANTERIOR RESECTION;  Surgeon: Candyce Champagne, MD;  Location: [facility];  Service: General;  Laterality: N/A;    Family History  Problem Relation Age of Onset   Ovarian cancer Mother    Uterine cancer Mother    Lung cancer Father    HIV Brother        41   Kidney cancer Brother    Lung cancer Brother    Other Brother        Mouth Cancer   Colon cancer Maternal Aunt    Heart disease Maternal Grandmother    Stroke Maternal Grandmother    Hypertension Maternal Grandmother    Diabetes Maternal Grandmother    Arthritis Other    Esophageal cancer Neg Hx    Liver disease Neg Hx    Rectal cancer Neg Hx    Stomach cancer Neg Hx     Social History   Socioeconomic History   Marital status: Married    Spouse name: Alexandria [name]   Number of children: 3   Years of education: Jr college   Highest education level: Not on file  Occupational History   Occupation: Leisure centre manager: UNEMPLOYED   Occupation: retired  Tobacco Use   Smoking status: Never   Smokeless tobacco: Never  Vaping Use   Vaping status: Never Used  Substance and Sexual Activity   Alcohol  use: No    Alcohol /week: 0.0 standard drinks of alcohol    Drug use: No   Sexual activity: Not Currently  Other Topics Concern   Not on file  Social History Narrative   Lives with husband Alexandria [name]   Caffeine use: coffee (2 cups per day)   Mostly right-handed   Social Drivers of Corporate investment banker Strain: Not on file  Food Insecurity: No Food Insecurity (08/31/2023)   Hunger Vital Sign    Worried About Running Out of Food in the Last Year: Never true    Ran Out of Food in the Last Year: Never true   Transportation Needs: No Transportation Needs (08/31/2023)   PRAPARE - Administrator, Civil Service (Medical): No    Lack of Transportation (Non-Medical): No  Physical Activity: Not on file  Stress: Not on file  Social Connections: Unknown (08/25/2023)   Social Connection and Isolation Panel    Frequency of Communication with Friends and Family: Patient declined    Frequency of Social Gatherings with Friends and Family: Patient declined    Attends Religious Services: Patient declined    Database administrator or Organizations: Patient declined    Attends Banker Meetings: Patient declined    Marital Status:  Married  Intimate Partner Violence: Not At Risk (08/31/2023)   Humiliation, Afraid, Rape, and Kick questionnaire    Fear of Current or Ex-Partner: No    Emotionally Abused: No    Physically Abused: No    Sexually Abused: No    Outpatient Medications Prior to Visit  Medication Sig Dispense Refill   albuterol  (VENTOLIN  HFA) 108 (90 Base) MCG/ACT inhaler Inhale 2 puffs into the lungs every 6 (six) hours as needed (coughing). 8 g 1   ALPRAZolam  (XANAX ) 0.5 MG tablet Take 1 tablet (0.5 mg total) by mouth 2 (two) times daily as needed for anxiety. 10 tablet 0   amLODipine  (NORVASC ) 5 MG tablet Take 1 tablet (5 mg total) by mouth daily. 30 tablet 2   aspirin  EC 81 MG tablet Take 81 mg by mouth 3 (three) times a week. Swallow whole.     benzonatate  (TESSALON ) 200 MG capsule Take 1 capsule (200 mg total) by mouth 3 (three) times daily as needed for cough. 20 capsule 3   diphenoxylate -atropine  (LOMOTIL ) 2.5-0.025 MG tablet Take 2 tablets by mouth 3 (three) times daily as needed for diarrhea or loose stools.     folic acid  (FOLVITE ) 400 MCG tablet Take 1 tablet by mouth every other day.     furosemide  (LASIX ) 20 MG tablet Take 1 tablet (20 mg total) by mouth daily. (Patient taking differently: Take 10 mg by mouth daily as needed for edema or fluid.) 30 tablet 3    ipratropium-albuterol  (DUONEB) 0.5-2.5 (3) MG/3ML SOLN Take 3 mLs by nebulization every 6 (six) hours as needed. 360 mL 1   levalbuterol  (XOPENEX ) 1.25 MG/3ML nebulizer solution Take 1.25 mg by nebulization every 6 (six) hours as needed for wheezing or shortness of breath. 150 mL 1   levothyroxine  (SYNTHROID ) 112 MCG tablet Take 1 tablet (112 mcg total) by mouth daily. 30 tablet 2   losartan  (COZAAR ) 50 MG tablet Take 50 mg by mouth daily.     magnesium  oxide (MAG-OX) 400 MG tablet Take 1 tablet (400 mg total) by mouth daily. 90 tablet 3   Multiple Vitamin (MULTIVITAMIN WITH MINERALS) TABS tablet Take 1 tablet by mouth once a week.     mupirocin ointment (BACTROBAN) 2 % Apply 1 Application topically 2 (two) times daily.     OFEV  100 MG CAPS Take 100 mg by mouth 2 (two) times daily.     ondansetron  (ZOFRAN -ODT) 4 MG disintegrating tablet TAKE 1 TABLET BY MOUTH EVERY 8 HOURS AS NEEDED FOR NAUSEA AND VOMITING (Patient taking differently: Take 4 mg by mouth as needed for nausea or vomiting.) 60 tablet 2   promethazine -dextromethorphan (PROMETHAZINE -DM) 6.25-15 MG/5ML syrup Take 5 mLs by mouth 4 (four) times daily as needed for cough. 118 mL 0   Respiratory Therapy Supplies (FULL KIT NEBULIZER SET) MISC Use with DUONEB for COPD treatment 1 each 0   vitamin C  (ASCORBIC ACID ) 500 MG tablet Take 500 mg by mouth 2 (two) times a week.     No facility-administered medications prior to visit.    Allergies  Allergen Reactions   Prednisone  Other (See Comments)    Was prescribed high doses at other times   Dilaudid  [Hydromorphone ] Itching and Other (See Comments)    Dilaudid  caused marked confusion   Ipratropium Bromide  Itching and Other (See Comments)    Mental Status Changes, Insomnia  Insomnia - Atrovent .   Atrovent  Hfa [Ipratropium Bromide  Hfa] Itching and Other (See Comments)    Pt could not sleep, Insomnia  Cheratussin Ac [Guaifenesin -Codeine ] Other (See Comments)    Headaches    Hydrocodone  Itching   Influenza Vaccines Other (See Comments)    Pt reports heart attack after last flu shot   Morphine  Itching   Oxycodone  Itching   Codeine  Itching, Nausea Only and Other (See Comments)    Pt takes promethazine  with codeine  at home   Diltiazem  Itching and Other (See Comments)    Makes patient sick. Shakes. GI   Ibuprofen  Itching and Other (See Comments)    Gives false reading in blood    ROS    See HPI Objective:     Physical Exam  SpO2 97%  Wt Readings from Last 3 Encounters:  09/13/23 133 lb 6 oz (60.5 kg)  09/06/23 135 lb (61.2 kg)  08/31/23 138 lb (62.6 kg)    Gen: Awake, alert, no acute distress Resp: Breathing is even and non-labored, coarse cough noted Psych: calm/pleasant demeanor Neuro: Alert and Oriented x 3, + facial symmetry, speech is clear.     Assessment & Plan:   Problem List Items Addressed This Visit       Unprioritized   History of pneumonia - Primary    Recently treated with antibiotics. Concern for residual pneumonia. Chest x-ray to evaluate lung status. Possible need for further antibiotics or steroids based on findings. - Stat chest x-ray negative for obvious infiltrates- notes chronic interstitial changes.  - suspect viral etiology.   - Continue guaifenesin  for symptomatic relief. - Advise follow-up if symptoms do not improve by Thursday or Friday. - Consider office visit for lung examination if symptoms persist, to evaluate for possible need for oral steroid treatment.      Relevant Orders   DG Chest 2 View (Completed)   Addendum:  I was contacted by radiology tech shortly after visit.  She offered to bring pt to her car via wheelchair following CXR.  Pt declined wheelchair but tech walked her out. Noted that patient had significant SOB/respiratory distress after walking and she was quite concerned about patient.   I advised tech to bring pt to ED via wheelchair for further evaluation.  The patient declined ED because she had a  funeral to go to the following day. She promised that she would return to the ED if symptoms worsened.  We reached out to the patient and scheduled her for an in person visit with PCP which is scheduled for 6/13.    I am having Lynnwood Sauer maintain her ascorbic acid , aspirin  EC, Ofev , multivitamin with minerals, ondansetron , furosemide , magnesium  oxide, folic acid , diphenoxylate -atropine , albuterol , promethazine -dextromethorphan, losartan , mupirocin ointment, amLODipine , ALPRAZolam , benzonatate , ipratropium-albuterol , levalbuterol , Full Kit Nebulizer Set, and levothyroxine .  No orders of the defined types were placed in this encounter.   I discussed the assessment and treatment plan with the patient. The patient was provided an opportunity to ask questions and all were answered. The patient agreed with the plan and demonstrated an understanding of the instructions.   The patient was advised to call back or seek an in-person evaluation if the symptoms worsen or if the condition fails to improve as anticipated.     Rochele Christmas, NP [facility] [phone] (phone) [phone] (fax)  [facility]

## 2023-09-14 NOTE — Telephone Encounter (Signed)
Pt seen by GI yesterday

## 2023-09-15 MED ORDER — METRONIDAZOLE 250 MG PO TABS: 250.0000 mg | ORAL_TABLET | Freq: Three times a day (TID) | ORAL | 0 refills | Status: DC

## 2023-09-15 NOTE — Telephone Encounter (Signed)
 MyChart message sent to patient with recommendations.

## 2023-09-15 NOTE — Addendum Note (Signed)
 Addended by: Dartha Rozzell N on: 09/15/2023 04:44 PM   Modules accepted: Orders

## 2023-09-15 NOTE — Telephone Encounter (Signed)
 Called and spoke with patient. Patient reports that she has fecal incontinence despite Lomotil  2 tablets up to three times a day, and 4 Benefiber gummies. Patient states that by the time she goes to the restroom to urinate she has already passed some stool. Patient would like to try Flagyl  as recommended. Patient will give us  an update on how she is feeling once she completes RX. Patient verbalized understanding and had no concerns at the end of the call.   Flagyl  RX sent to pharmacy, patient confirmed.

## 2023-09-15 NOTE — Telephone Encounter (Signed)
 Inbound call from patient stating when she stands up she is still having urgent diarrhea. States she has done all of the recommendations but nothing seems to help. Requesting a call back. Please advise, thank you.

## 2023-09-15 NOTE — Telephone Encounter (Signed)
 Alexandria [name], I see that you ordered x-ray to check for stool burden. Called over to radiology reading room, report should be available soon. Please advise on bowel regimen. Thanks

## 2023-09-16 DIAGNOSIS — Z8701 Personal history of pneumonia (recurrent): Secondary | ICD-10-CM | POA: Insufficient documentation

## 2023-09-16 NOTE — Addendum Note (Signed)
 Addended by: Jospeh Mangel N on: 09/16/2023 10:10 AM   Modules accepted: Orders

## 2023-09-16 NOTE — Assessment & Plan Note (Signed)
  Recently treated with antibiotics. Concern for residual pneumonia. Chest x-ray to evaluate lung status. Possible need for further antibiotics or steroids based on findings. - Stat chest x-ray negative for obvious infiltrates- notes chronic interstitial changes.  - suspect viral etiology.   - Continue guaifenesin  for symptomatic relief. - Advise follow-up if symptoms do not improve by Thursday or Friday. - Consider office visit for lung examination if symptoms persist, to evaluate for possible need for oral steroid treatment.

## 2023-09-16 NOTE — Patient Instructions (Signed)
 VISIT SUMMARY:  During your visit, we discussed your sinus drainage, fever, and recent history of pneumonia. We have ordered a chest x-ray to rule out any complications and provided guidance on managing your symptoms.  YOUR PLAN:  UPPER RESPIRATORY INFECTION: You have symptoms of a viral upper respiratory infection, including fever and cough. Your risk for COVID-19 is low. -We have ordered a chest x-ray to rule out pneumonia. -Continue taking guaifenesin  (Mucinex ) for relief of your symptoms. -Follow up with us  if your symptoms do not improve by Thursday or Friday. -Contact us  immediately if your symptoms worsen.  RECENT PNEUMONIA: You were recently treated for pneumonia with antibiotics. We need to check if there is any residual pneumonia. -We have ordered a chest x-ray to check for any remaining pneumonia. -Depending on the chest x-ray results, you may need additional antibiotics. -If your symptoms persist, consider scheduling an office visit for a lung examination to see if you need steroid treatment.

## 2023-09-16 NOTE — Telephone Encounter (Signed)
 Inbound call from patient stating Flagyl  has not been helping as of now. States she has taking 2 tablets but still is having constant diarrhea. Patient is requesting a call to discuss other options. Please advise, thank you.

## 2023-09-16 NOTE — Telephone Encounter (Signed)
 Returned call to patient. Patient reports that she is still having incontinence. I told patient that she will need to give medication more time to work. Patient states that she wakes up soiled. Patient states that she doesn't want to be a prisoner in her own house. I informed patient that she may have a decreased rectal tone and that [facility] recommended pelvic floor PT. Patient states that she would do whatever. I told her that I will place referral to PT and contact Mylinda Asa for any other recommendations.   Ambulatory referral to PT in epic.

## 2023-09-17 ENCOUNTER — Ambulatory Visit (INDEPENDENT_AMBULATORY_CARE_PROVIDER_SITE_OTHER): Admitting: Family Medicine

## 2023-09-17 ENCOUNTER — Encounter: Payer: Self-pay | Admitting: Family Medicine

## 2023-09-17 VITALS — BP 144/88 | HR 88 | Temp 97.7°F | Resp 20 | Ht 64.0 in | Wt 134.0 lb

## 2023-09-17 DIAGNOSIS — R197 Diarrhea, unspecified: Secondary | ICD-10-CM | POA: Diagnosis not present

## 2023-09-17 MED ORDER — METRONIDAZOLE 500 MG PO TABS: 500.0000 mg | ORAL_TABLET | Freq: Three times a day (TID) | ORAL | 0 refills | Status: AC

## 2023-09-17 NOTE — Assessment & Plan Note (Signed)
 Con't flagyl   F/u GI

## 2023-09-17 NOTE — Progress Notes (Signed)
 Established Patient Office Visit  Subjective   Patient ID: Alexandria [name], female    DOB: June 05, 1945  Age: 78 y.o. MRN: [id number]  Chief Complaint  Patient presents with   Diarrhea   Follow-up    HPI Discussed the use of AI scribe software for clinical note transcription with the patient, who gave verbal consent to proceed.  History of Present Illness   Alexandria [name] is a 78 year old female who presents with persistent diarrhea.  She has been experiencing persistent diarrhea despite being prescribed metronidazole  (Flagyl ). She experiences tremors as a side effect of the medication and is unsure if she can continue taking it. She has been taking two tablets but continues to have constant diarrhea. She is awaiting further advice from her gastroenterologist.  She mentions low potassium levels and is currently taking potassium supplements (Klor-Con ). She recalls being advised not to take potassium, but she believes she needs it due to her consistently low levels.  Lomotil  has been effective in managing her diarrhea, but she is cautious about long-term use. She has self-adjusted her medication in the past, taking herself off and back on Lomotil  as needed.  She has had an abdominal x-ray and blood work done recently, and she is scheduled for an endoscopy on July 9th.  She is trying to manage her living situation, which she describes as stressful, living with her daughter, a 'crazy man,' and two pit bulls, which she feels is not conducive to her healing.      Patient Active Problem List   Diagnosis Date Noted   History of pneumonia 09/16/2023   CAP (community acquired pneumonia) 08/25/2023   Pneumonia of right lower lobe due to infectious organism 06/10/2023   Suspicious nevus 01/05/2023   Anemia 01/05/2023   Deviated septum 01/05/2023   Episodic tension-type headache, not intractable 11/19/2022   Old myocardial infarction 11/19/2022   Postthrombotic syndrome  11/19/2022   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   Irritable bowel syndrome without diarrhea 11/19/2022   Ileostomy care (HCC) 11/18/2022   Diaper candidiasis 09/28/2022   Ileostomy, has currently (HCC) 09/28/2022   At risk for dehydration 09/16/2022   At high risk for dehydration 09/14/2022   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   Dehydration 08/26/2022   Irritant contact dermatitis associated with digestive stoma 08/26/2022   Ileostomy in place [facility]) 08/26/2022   High output ileostomy (HCC) 08/25/2022   Diastolic dysfunction 08/14/2022   Chronic facial pain 08/14/2022   Dyspepsia 08/14/2022   Blowout of rectal stump (HCC) 08/14/2022   Diverticulitis of colon 08/13/2022   Colostomy care (HCC) 07/31/2022   Bronchospasm 06/16/2022   Temporal arteritis (HCC) 06/16/2022   Chronic obstructive pulmonary disease with (acute) lower respiratory infection (HCC) 06/16/2022   Angina pectoris with documented spasm (HCC) 06/16/2022   Abdominal aortic aneurysm (AAA) without rupture (HCC) 06/16/2022   Right ankle sprain 06/09/2022   Sprain of right ankle 06/01/2022   Dizziness 06/01/2022   Rectal abscess 06/01/2022   Acute right ankle pain 05/19/2022   History of low anterior resection of rectum 05/04/2022   Irritant contact dermatitis associated with fecal stoma 05/02/2022   Slow transit constipation 05/02/2022   Colostomy complication (HCC) 05/02/2022   IDA (iron  deficiency anemia) 04/19/2022   Intra-abdominal abscess (HCC) 04/18/2022   Acute blood loss anemia 04/17/2022   Chronic diastolic CHF (congestive heart failure) (HCC) 04/17/2022   Toxic metabolic encephalopathy 04/17/2022   Ileus following gastrointestinal surgery (HCC) 04/17/2022   Pelvic abscess in female  03/30/2022   Flat foot 11/19/2021   S/P total knee arthroplasty, left 11/13/2021   Ankle impingement syndrome, left 11/13/2021   Rectal bleeding 08/22/2021   Abnormal thyroid  blood test 08/22/2021   Abnormal kidney  function 08/22/2021   Dumping syndrome 03/13/2020   Arthritis    Bronchial pneumonia    Colon polyp    DVT (deep venous thrombosis) (HCC)    Heart murmur    Migraines    Pancreatitis    Thyroid  disease    Aortic root dilatation (HCC) 11/24/2019   Leukocytosis 11/06/2019   Diarrhea 11/06/2019   Generalized abdominal pain 11/06/2019   Pre-ulcerative corn or callous 09/07/2019   Nausea 09/07/2019   Pain of left calf 05/09/2019   Tibial pain 05/09/2019   Essential hypertension 02/08/2019   Healthcare maintenance 11/10/2018   Bradycardia 07/22/2018   Pansinusitis 06/23/2018   History of transient ischemic attack (TIA) 03/24/2018   IPF (idiopathic pulmonary fibrosis) (HCC) 03/24/2018   Chronic rhinitis 02/16/2018   Eustachian tube dysfunction, bilateral 02/16/2018   URI (upper respiratory infection) 02/16/2018   Seasonal allergic rhinitis due to pollen 02/16/2018   ILD (interstitial lung disease) (HCC) 01/07/2018   Dyspnea 01/07/2018   Acute respiratory failure (HCC) 01/06/2018   TIA (transient ischemic attack) 01/06/2018   Hypokalemia 12/15/2017   Chronic cough 11/26/2017   Basal cell carcinoma (BCC) of neck 06/07/2017   Anisometropia 06/02/2017   Drusen of macula of both eyes 06/02/2017   Photopsia of left eye 06/02/2017   Cardiac murmur 05/07/2017   Situational anxiety 05/07/2017   Headache 05/07/2017   Mitral and aortic insufficiency 05/07/2017   Migraine without status migrainosus, not intractable 06/09/2016   Insomnia 11/18/2015   Normal coronary arteries 05/20/2015   RBBB 05/20/2015   Abnormal CT of the chest 05/20/2015   Hx of myocardial infarction 05/08/2015   Bruising 05/08/2015   Upper airway cough syndrome 02/14/2015   Asthmatic bronchitis with acute exacerbation 02/06/2015   Mild intermittent asthma without complication 02/06/2015   Oliguria 12/28/2014   Hypothyroidism 10/25/2014   Fatigue 10/25/2014   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   Chronic venous  insufficiency 10/24/2014   Varicose veins of leg with complications 10/24/2014   Degeneration of intervertebral disc of cervical region 06/29/2014   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   Generalized osteoarthritis of multiple sites 06/29/2014   History of stroke without residual deficits 06/29/2014   Palpitations 11/08/2013   Edema of lower extremity 10/03/2013   Hair loss 11/25/2012   Alopecia 11/25/2012   Diverticulitis of large intestine with perforation and abscess 07/14/2012   Hammer toe 06/13/2012   History of stress test 05/21/2011   Hx of echocardiogram 01/27/2010   H/O cardiac catheterization 2004   Past Medical History:  Diagnosis Date   Arthritis    Bronchial pneumonia    Chronic facial pain 08/14/2022   Colon polyp    Diverticulitis    DVT (deep venous thrombosis) (HCC)    lower extremity   H/O cardiac catheterization 2004   Normal coronary arteries   Heart murmur    History of stress test 05/21/2011   Hx of echocardiogram 01/27/2010   Normal Ef 55% the transmitral spectral doppler flow pattern is normal for age. the left ventricular wall motion is normal   Hypothyroidism    IPF (idiopathic pulmonary fibrosis) (HCC) 01/2018   Myocardial infarction (HCC)    hx of 30 years ago   Pancreatitis    TIA (transient ischemic attack)    8 years ago  Past Surgical History:  Procedure Laterality Date   BREAST BIOPSY Left    [facility]   CARDIAC CATHETERIZATION     10/2013   CATARACT EXTRACTION Bilateral 03/22/2018   FLEXIBLE SIGMOIDOSCOPY N/A 08/13/2022   Procedure: FLEXIBLE SIGMOIDOSCOPY;  Surgeon: Candyce Champagne, MD;  Location: [facility];  Service: General;  Laterality: N/A;   ILEOSTOMY CLOSURE N/A 11/19/2022   Procedure: OPEN TAKEDOWN OF LOOP ILEOSTOMY;  Surgeon: Candyce Champagne, MD;  Location: [facility];  Service: General;  Laterality: N/A;   LEFT HEART CATHETERIZATION WITH CORONARY ANGIOGRAM N/A 10/25/2013   Procedure: LEFT HEART CATHETERIZATION  WITH CORONARY ANGIOGRAM;  Surgeon: Millicent Ally, MD;  Location: [facility] CATH LAB;  Service: Cardiovascular;  Laterality: N/A;   LYSIS OF ADHESION N/A 08/13/2022   Procedure: LYSIS OF ADHESION, DIVERTING ILEOSTOMY;  Surgeon: Candyce Champagne, MD;  Location: [facility];  Service: General;  Laterality: N/A;   NECK SURGERY     ACDF x 2      done in [place] more than 25 years, doesn't know levels   RHINOPLASTY  08/16/2023   uses A[place] in her nostril   TOTAL KNEE ARTHROPLASTY     Bilateral x's 2   VAGINAL HYSTERECTOMY  10/17/1998   Ron Andree Kayser   VIDEO BRONCHOSCOPY Bilateral 01/24/2018   Procedure: VIDEO BRONCHOSCOPY WITH FLUORO;  Surgeon: Marine Sia, MD;  Location: [facility] ENDOSCOPY;  Service: Cardiopulmonary;  Laterality: Bilateral;   XI ROBOTIC ASSISTED COLOSTOMY TAKEDOWN N/A 08/13/2022   Procedure: ROBOTIC OSTOMY TAKEDOWN, SMALL BOWEL RESECTION X 2, BILATERAL TAP BLOCK, TISSUE PERFUSSION ASSESMENT VIA FIREFLY, MOBILAZATION OF SPLENIC FLEXURE;  Surgeon: Candyce Champagne, MD;  Location: [facility];  Service: General;  Laterality: N/A;   XI ROBOTIC ASSISTED LOWER ANTERIOR RESECTION N/A 03/31/2022   Procedure: XI ROBOTIC ASSISTED LOWER ANTERIOR RESECTION;  Surgeon: Candyce Champagne, MD;  Location: [facility];  Service: General;  Laterality: N/A;   Social History   Tobacco Use   Smoking status: Never   Smokeless tobacco: Never  Vaping Use   Vaping status: Never Used  Substance Use Topics   Alcohol  use: No    Alcohol /week: 0.0 standard drinks of alcohol    Drug use: No   Social History   Socioeconomic History   Marital status: Married    Spouse name: Cortland Ding   Number of children: 3   Years of education: Jr college   Highest education level: Not on file  Occupational History   Occupation: Leisure centre manager: UNEMPLOYED   Occupation: retired  Tobacco Use   Smoking status: Never   Smokeless tobacco: Never  Vaping Use   Vaping status: Never Used  Substance and Sexual Activity   Alcohol  use: No     Alcohol /week: 0.0 standard drinks of alcohol    Drug use: No   Sexual activity: Not Currently  Other Topics Concern   Not on file  Social History Narrative   Lives with husband Cortland Ding   Caffeine use: coffee (2 cups per day)   Mostly right-handed   Social Drivers of Corporate investment banker Strain: Not on file  Food Insecurity: No Food Insecurity (08/31/2023)   Hunger Vital Sign    Worried About Running Out of Food in the Last Year: Never true    Ran Out of Food in the Last Year: Never true  Transportation Needs: No Transportation Needs (08/31/2023)   PRAPARE - Administrator, Civil Service (Medical): No    Lack of Transportation (Non-Medical): No  Physical Activity:  Not on file  Stress: Not on file  Social Connections: Unknown (08/25/2023)   Social Connection and Isolation Panel    Frequency of Communication with Friends and Family: Patient declined    Frequency of Social Gatherings with Friends and Family: Patient declined    Attends Religious Services: Patient declined    Database administrator or Organizations: Patient declined    Attends Banker Meetings: Patient declined    Marital Status: Married  Catering manager Violence: Not At Risk (08/31/2023)   Humiliation, Afraid, Rape, and Kick questionnaire    Fear of Current or Ex-Partner: No    Emotionally Abused: No    Physically Abused: No    Sexually Abused: No   Family Status  Relation Name Status   Mother  Deceased   Father  Deceased   Brother  (Not Specified)   Brother  (Not Specified)   Brother  (Not Specified)   Brother  (Not Specified)   Brother  (Not Specified)   Mat Aunt  (Not Specified)   MGM  (Not Specified)   Other  (Not Specified)   Neg Hx  (Not Specified)  No partnership data on file   Family History  Problem Relation Age of Onset   Ovarian cancer Mother    Uterine cancer Mother    Lung cancer Father    HIV Brother        1982   Kidney cancer Brother    Lung cancer  Brother    Other Brother        Mouth Cancer   Colon cancer Maternal Aunt    Heart disease Maternal Grandmother    Stroke Maternal Grandmother    Hypertension Maternal Grandmother    Diabetes Maternal Grandmother    Arthritis Other    Esophageal cancer Neg Hx    Liver disease Neg Hx    Rectal cancer Neg Hx    Stomach cancer Neg Hx    Allergies  Allergen Reactions   Prednisone  Other (See Comments)    Was prescribed high doses at other times   Dilaudid  [Hydromorphone ] Itching and Other (See Comments)    Dilaudid  caused marked confusion   Ipratropium Bromide  Itching and Other (See Comments)    Mental Status Changes, Insomnia  Insomnia - Atrovent .   Atrovent  Hfa [Ipratropium Bromide  Hfa] Itching and Other (See Comments)    Pt could not sleep, Insomnia    Cheratussin Ac [Guaifenesin -Codeine ] Other (See Comments)    Headaches    Hydrocodone Itching   Influenza Vaccines Other (See Comments)    Pt reports heart attack after last flu shot   Morphine  Itching   Oxycodone  Itching   Codeine  Itching, Nausea Only and Other (See Comments)    Pt takes promethazine  with codeine  at home   Diltiazem  Itching and Other (See Comments)    Makes patient sick. Shakes. GI   Ibuprofen  Itching and Other (See Comments)    Gives false reading in blood    Review of Systems  Constitutional:  Negative for fever.  Eyes:  Positive for photophobia.  Respiratory:  Negative for cough, hemoptysis, sputum production, shortness of breath and wheezing.   Cardiovascular:  Negative for chest pain.  Gastrointestinal:  Positive for diarrhea.      Objective:     BP (!) 144/88 (BP Location: Right Arm, Patient Position: Sitting, Cuff Size: Normal)   Pulse 88   Temp 97.7 F (36.5 C) (Oral)   Resp 20   Ht 5' 4 (1.626 m)  Wt 134 lb (60.8 kg)   BMI 23.00 kg/m  BP Readings from Last 3 Encounters:  09/17/23 (!) 144/88  09/13/23 (!) 140/74  09/06/23 (!) 149/78   Wt Readings from Last 3 Encounters:   09/17/23 134 lb (60.8 kg)  09/13/23 133 lb 6 oz (60.5 kg)  09/06/23 135 lb (61.2 kg)   SpO2 Readings from Last 3 Encounters:  09/14/23 97%  09/06/23 95%  08/28/23 95%      Physical Exam Vitals and nursing note reviewed.  Constitutional:      General: She is not in acute distress.    Appearance: Normal appearance. She is well-developed.  HENT:     Head: Normocephalic and atraumatic.   Eyes:     General: No scleral icterus.       Right eye: No discharge.        Left eye: No discharge.    Cardiovascular:     Rate and Rhythm: Normal rate and regular rhythm.     Heart sounds: No murmur heard. Pulmonary:     Effort: Pulmonary effort is normal. No respiratory distress.     Breath sounds: Normal breath sounds.   Musculoskeletal:        General: Normal range of motion.     Cervical back: Normal range of motion and neck supple.     Right lower leg: No edema.     Left lower leg: No edema.   Skin:    General: Skin is warm and dry.   Neurological:     Mental Status: She is alert and oriented to person, place, and time.   Psychiatric:        Mood and Affect: Mood normal.        Behavior: Behavior normal.        Thought Content: Thought content normal.        Judgment: Judgment normal.      No results found for any visits on 09/17/23.  Last CBC Lab Results  Component Value Date   WBC 6.2 09/13/2023   HGB 13.4 09/13/2023   HCT 40.4 09/13/2023   MCV 95.5 09/13/2023   MCH 32.2 09/03/2023   RDW 14.5 09/13/2023   PLT 249.0 09/13/2023   Last metabolic panel Lab Results  Component Value Date   GLUCOSE 101 (H) 09/13/2023   NA 138 09/13/2023   K 3.2 (L) 09/13/2023   CL 98 09/13/2023   CO2 30 09/13/2023   BUN 11 09/13/2023   CREATININE 0.79 09/13/2023   GFR 71.48 09/13/2023   CALCIUM  9.6 09/13/2023   PHOS 2.4 (L) 04/21/2022   PROT 8.1 09/13/2023   ALBUMIN  4.2 09/13/2023   BILITOT 0.6 09/13/2023   ALKPHOS 87 09/13/2023   AST 16 09/13/2023   ALT 10  09/13/2023   ANIONGAP 10 08/27/2023   Last lipids Lab Results  Component Value Date   CHOL 177 12/04/2022   HDL 44 (L) 12/04/2022   LDLCALC 113 (H) 12/04/2022   TRIG 95 12/04/2022   CHOLHDL 4.0 12/04/2022   Last hemoglobin A1c Lab Results  Component Value Date   HGBA1C 5.4 04/05/2022   Last thyroid  functions Lab Results  Component Value Date   TSH 0.32 (L) 09/09/2023   T4TOTAL 10.7 09/09/2023   Last vitamin D  Lab Results  Component Value Date   VD25OH 29.9 (L) 05/09/2019   Last vitamin B12 and Folate Lab Results  Component Value Date   VITAMINB12 640 04/18/2022   FOLATE 10.8 04/18/2022  The ASCVD Risk score (Arnett DK, et al., 2019) failed to calculate for the following reasons:   Risk score cannot be calculated because patient has a medical history suggesting prior/existing ASCVD    Assessment & Plan:   Problem List Items Addressed This Visit       Unprioritized   Diarrhea - Primary   Con't flagyl   F/u GI      Assessment and Plan    Chronic Diarrhea   Chronic diarrhea continues despite metronidazole  treatment, causing tremors as a side effect. Lomotil  has been effective in the past, but long-term use is not advisable due to potential side effects. An endoscopy is scheduled for July 9th to investigate further. Increasing the metronidazole  dosage to 500 mg is planned, with careful monitoring for symptom changes and side effects. A prescription for the increased dosage will be provided. She should continue pelvic floor exercises as recommended by physical therapy. Follow-up with gastroenterology is necessary for further evaluation and management.  Hypokalemia   She requires ongoing potassium supplementation with Klor-Con  due to hypokalemia, despite previous advice to stop. Regular monitoring of potassium levels is essential to ensure they remain within the normal range.  Living Situation Stress   Living with her daughter, a partner, and two pit bulls in a  chaotic environment is causing significant stress, affecting her mental and physical health. Exploring alternative living arrangements, such as renting a room at a nearby hotel, may help reduce stress.  Follow-up   She has multiple follow-up appointments and tests scheduled to address ongoing medical issues. A follow-up with gastroenterology is set for June 24th. Contact Dr. Hershell Lose on Monday for further guidance on diarrhea management. Attendance at the endoscopy appointment on July 9th is essential.        Return if symptoms worsen or fail to improve.    Britten Parady R Lowne Chase, DO

## 2023-09-17 NOTE — Patient Instructions (Signed)
 Diarrhea, Adult Diarrhea is frequent loose and sometimes watery bowel movements. Diarrhea can make you feel weak and cause you to become dehydrated. Dehydration is a condition in which there is not enough water or other fluids in the body. Dehydration can make you tired and thirsty, cause you to have a dry mouth, and decrease how often you urinate. Diarrhea typically lasts 2-3 days. However, it can last longer if it is a sign of something more serious. It is important to treat your diarrhea as told by your health care provider. Follow these instructions at home: Eating and drinking     Follow these recommendations as told by your health care provider: Take an oral rehydration solution (ORS). This is an over-the-counter medicine that helps return your body to its normal balance of nutrients and water. It is found at pharmacies and retail stores. Drink enough fluid to keep your urine pale yellow. Drink fluids such as water, diluted fruit juice, and low-calorie sports drinks. You can drink milk also, if desired. Sucking on ice chips is another way to get fluids. Avoid drinking fluids that contain a lot of sugar or caffeine, such as soda, energy drinks, and regular sports drinks. Avoid alcohol. Eat bland, easy-to-digest foods in small amounts as you are able. These foods include bananas, applesauce, rice, lean meats, toast, and crackers. Avoid spicy or fatty foods.  Medicines Take over-the-counter and prescription medicines only as told by your health care provider. If you were prescribed antibiotics, take them as told by your health care provider. Do not stop using the antibiotic even if you start to feel better. General instructions  Wash your hands often using soap and water for at least 20 seconds. If soap and water are not available, use hand sanitizer. Others in the household should wash their hands as well. Hands should be washed: After using the toilet or changing a diaper. Before  preparing, cooking, or serving food. While caring for a sick person or while visiting someone in a hospital. Rest at home while you recover. Take a warm bath to relieve any burning or pain from frequent diarrhea episodes. Watch your condition for any changes. Contact a health care provider if: You have a fever. Your diarrhea gets worse. You have new symptoms. You vomit every time you eat or drink. You feel light-headed, dizzy, or have a headache. You have muscle cramps. You have signs of dehydration, such as: Dark urine, very little urine, or no urine. Cracked lips. Dry mouth. Sunken eyes. Sleepiness. Weakness. You have bloody or black stools or stools that look like tar. You have severe pain, cramping, or bloating in your abdomen. Your skin feels cold and clammy. You feel confused. Get help right away if: You have chest pain or your heart is beating very quickly. You have trouble breathing or you are breathing very quickly. You feel extremely weak or you faint. These symptoms may be an emergency. Get help right away. Call 911. Do not wait to see if the symptoms will go away. Do not drive yourself to the hospital. This information is not intended to replace advice given to you by your health care provider. Make sure you discuss any questions you have with your health care provider. Document Revised: 09/09/2021 Document Reviewed: 09/09/2021 Elsevier Patient Education  2024 [employer].

## 2023-09-20 ENCOUNTER — Telehealth: Payer: Self-pay

## 2023-09-20 NOTE — Telephone Encounter (Signed)
 Called and informed patient to complete full course of Flagyl  even though she is feeling better. Patient has been advised that if symptoms return she will need to follow up with [facility] regarding lower anterior resection syndrome. Patient will keep upcoming EGD as scheduled. Patient verbalized understanding and had no concerns at the end of the call.

## 2023-09-20 NOTE — Telephone Encounter (Signed)
 Patient called and stated that she would like to let Santina Cull know that she has tried the Flagyl  and has noticed that the medication worked some and has stop some of her symptoms. Patient also stated that she would like to know why she needs to reach out to [facility] again. Patient is requesting a call back. Please advise.

## 2023-09-20 NOTE — Telephone Encounter (Signed)
 Patient notified of this information.

## 2023-09-20 NOTE — Telephone Encounter (Signed)
 Copied from CRM [id number]. Topic: Clinical - Medication Question >> Sep 20, 2023 10:56 AM Dewanda Foots wrote: Reason for CRM: Pt states this medication has slowed it down and it not as bad as it was, would she recommend something else or continue to take the medication?   metroNIDAZOLE  (FLAGYL ) 500 MG tablet  Please call patient back at [phone] with your recommendation

## 2023-09-23 ENCOUNTER — Telehealth: Payer: Self-pay

## 2023-09-23 NOTE — Telephone Encounter (Signed)
 Copied from CRM [id number]. Topic: General - Other >> Sep 23, 2023 10:36 AM Freya Jesus wrote: Reason for CRM: Patient called to leave a message for Dr. Crecencio Dodge. Stated that she's been on metroNIDAZOLE  (FLAGYL ) 500 MG tablet [[id number]] and that her poop has gotten better but has not gone away. Patient requesting a follow up from her provider or her nurse.

## 2023-09-26 ENCOUNTER — Emergency Department (HOSPITAL_BASED_OUTPATIENT_CLINIC_OR_DEPARTMENT_OTHER): Admission: EM | Admit: 2023-09-26 | Discharge: 2023-09-27 | Disposition: A | Discharge status: Home / Self Care

## 2023-09-26 ENCOUNTER — Emergency Department (HOSPITAL_BASED_OUTPATIENT_CLINIC_OR_DEPARTMENT_OTHER)

## 2023-09-26 ENCOUNTER — Other Ambulatory Visit: Payer: Self-pay

## 2023-09-26 ENCOUNTER — Other Ambulatory Visit: Payer: Self-pay | Admitting: Family Medicine

## 2023-09-26 ENCOUNTER — Encounter (HOSPITAL_BASED_OUTPATIENT_CLINIC_OR_DEPARTMENT_OTHER): Payer: Self-pay | Admitting: Emergency Medicine

## 2023-09-26 DIAGNOSIS — R7309 Other abnormal glucose: Secondary | ICD-10-CM | POA: Insufficient documentation

## 2023-09-26 DIAGNOSIS — R0602 Shortness of breath: Secondary | ICD-10-CM | POA: Insufficient documentation

## 2023-09-26 DIAGNOSIS — Z7982 Long term (current) use of aspirin: Secondary | ICD-10-CM | POA: Insufficient documentation

## 2023-09-26 DIAGNOSIS — I700 Atherosclerosis of aorta: Secondary | ICD-10-CM | POA: Diagnosis not present

## 2023-09-26 DIAGNOSIS — E039 Hypothyroidism, unspecified: Secondary | ICD-10-CM | POA: Diagnosis not present

## 2023-09-26 DIAGNOSIS — R1084 Generalized abdominal pain: Secondary | ICD-10-CM | POA: Diagnosis not present

## 2023-09-26 DIAGNOSIS — R059 Cough, unspecified: Secondary | ICD-10-CM | POA: Diagnosis not present

## 2023-09-26 DIAGNOSIS — I10 Essential (primary) hypertension: Secondary | ICD-10-CM | POA: Diagnosis not present

## 2023-09-26 DIAGNOSIS — K529 Noninfective gastroenteritis and colitis, unspecified: Secondary | ICD-10-CM | POA: Insufficient documentation

## 2023-09-26 DIAGNOSIS — Z79899 Other long term (current) drug therapy: Secondary | ICD-10-CM | POA: Insufficient documentation

## 2023-09-26 DIAGNOSIS — R918 Other nonspecific abnormal finding of lung field: Secondary | ICD-10-CM | POA: Diagnosis not present

## 2023-09-26 DIAGNOSIS — R197 Diarrhea, unspecified: Secondary | ICD-10-CM

## 2023-09-26 LAB — CBC WITH DIFFERENTIAL/PLATELET
Abs Immature Granulocytes: 0.05 10*3/uL (ref 0.00–0.07)
Basophils Absolute: 0.1 10*3/uL (ref 0.0–0.1)
Basophils Relative: 1 %
Eosinophils Absolute: 0.2 10*3/uL (ref 0.0–0.5)
Eosinophils Relative: 4 %
HCT: 42.3 % (ref 36.0–46.0)
Hemoglobin: 13.7 g/dL (ref 12.0–15.0)
Immature Granulocytes: 1 %
Lymphocytes Relative: 18 %
Lymphs Abs: 1.2 10*3/uL (ref 0.7–4.0)
MCH: 31.4 pg (ref 26.0–34.0)
MCHC: 32.4 g/dL (ref 30.0–36.0)
MCV: 96.8 fL (ref 80.0–100.0)
Monocytes Absolute: 0.8 10*3/uL (ref 0.1–1.0)
Monocytes Relative: 12 %
Neutro Abs: 4.2 10*3/uL (ref 1.7–7.7)
Neutrophils Relative %: 64 %
Platelets: 271 10*3/uL (ref 150–400)
RBC: 4.37 MIL/uL (ref 3.87–5.11)
RDW: 13.8 % (ref 11.5–15.5)
WBC: 6.5 10*3/uL (ref 4.0–10.5)
nRBC: 0 % (ref 0.0–0.2)

## 2023-09-26 LAB — COMPREHENSIVE METABOLIC PANEL WITH GFR
ALT: 10 U/L (ref 0–44)
AST: 26 U/L (ref 15–41)
Albumin: 3.9 g/dL (ref 3.5–5.0)
Alkaline Phosphatase: 84 U/L (ref 38–126)
Anion gap: 12 (ref 5–15)
BUN: 9 mg/dL (ref 8–23)
CO2: 23 mmol/L (ref 22–32)
Calcium: 9.3 mg/dL (ref 8.9–10.3)
Chloride: 105 mmol/L (ref 98–111)
Creatinine, Ser: 0.75 mg/dL (ref 0.44–1.00)
GFR, Estimated: >60 mL/min (ref >60)
Glucose, Bld: 131 mg/dL — ABNORMAL HIGH (ref 70–99)
Potassium: 3.8 mmol/L (ref 3.5–5.1)
Sodium: 140 mmol/L (ref 135–145)
Total Bilirubin: 0.5 mg/dL (ref 0.0–1.2)
Total Protein: 7.4 g/dL (ref 6.5–8.1)

## 2023-09-26 LAB — I-STAT VENOUS BLOOD GAS, ED
Acid-Base Excess: 2 mmol/L (ref 0.0–2.0)
Bicarbonate: 25.9 mmol/L (ref 20.0–28.0)
Calcium, Ion: 1.15 mmol/L (ref 1.15–1.40)
HCT: 40 % (ref 36.0–46.0)
Hemoglobin: 13.6 g/dL (ref 12.0–15.0)
O2 Saturation: 74 %
Potassium: 4.1 mmol/L (ref 3.5–5.1)
Sodium: 141 mmol/L (ref 135–145)
TCO2: 27 mmol/L (ref 22–32)
pCO2, Ven: 35.7 mmHg — ABNORMAL LOW (ref 44–60)
pH, Ven: 7.468 — ABNORMAL HIGH (ref 7.25–7.43)
pO2, Ven: 36 mmHg (ref 32–45)

## 2023-09-26 LAB — LACTIC ACID, PLASMA: Lactic Acid, Venous: 1.4 mmol/L (ref 0.5–1.9)

## 2023-09-26 LAB — LIPASE, BLOOD: Lipase: 26 U/L (ref 11–51)

## 2023-09-26 MED ORDER — FENTANYL CITRATE PF 50 MCG/ML IJ SOSY
25.0000 ug | PREFILLED_SYRINGE | Freq: Once | INTRAMUSCULAR | Status: AC
  Administered 2023-09-26: 25 ug via INTRAVENOUS
  Filled 2023-09-26: qty 1

## 2023-09-26 MED ORDER — ONDANSETRON HCL 4 MG/2ML IJ SOLN
4.0000 mg | Freq: Once | INTRAMUSCULAR | Status: AC
  Administered 2023-09-26: 4 mg via INTRAVENOUS
  Filled 2023-09-26: qty 2

## 2023-09-26 MED ORDER — IOHEXOL 300 MG/ML  SOLN
80.0000 mL | Freq: Once | INTRAMUSCULAR | Status: AC | PRN
  Administered 2023-09-26: 80 mL via INTRAVENOUS

## 2023-09-26 MED ORDER — SODIUM CHLORIDE 0.9 % IV BOLUS
1000.0000 mL | Freq: Once | INTRAVENOUS | Status: AC
Start: 2023-09-26 — End: 2023-09-27
  Administered 2023-09-26: 1000 mL via INTRAVENOUS

## 2023-09-26 MED ORDER — OXYCODONE-ACETAMINOPHEN 5-325 MG PO TABS
1.0000 | ORAL_TABLET | Freq: Once | ORAL | Status: AC
  Administered 2023-09-26: 1 via ORAL
  Filled 2023-09-26: qty 1

## 2023-09-26 NOTE — ED Triage Notes (Signed)
 Pt c/o diarrhea x 4 wks; c/o generalized abd pain; has been worked up for same; endoscopy scheduled for July; pt sts she had rhinoplasty on 6/4 and this is when everything started; pt appears uncomfortable

## 2023-09-26 NOTE — ED Notes (Addendum)
 RT Note : VBG completed and handed to  Jasmine Pang PA-C

## 2023-09-26 NOTE — ED Provider Notes (Signed)
 [place] [facility] Provider Note   CSN: [id number] Arrival date & time: 09/26/23  2138     Patient presents with: Diarrhea   Alexandria [name] is a 78 y.o. female.  With past medical history of idiopathic pulmonary fibrosis, hypothyroidism, hypertension presents emergency room with approximately 2 to 4 weeks of diarrhea.  Patient notes 4-5 episodes of loose stool per day.  Does have blood in stool.  She notes severe abdominal pain.  Denies fever.  But she has had a hard time tolerating oral intake over the past few days and symptoms seem to be getting worse.  She has tried Flagyl  for her diarrhea, Imodium  and Pepto-Bismol.  She has not had any improvement.  Prior to that she was treated for pneumonia and had hospital stay.   {Add pertinent medical, surgical, social history, OB history to HPI:32947}  Diarrhea      Prior to Admission medications   Medication Sig Start Date End Date Taking? Authorizing Provider  albuterol  (VENTOLIN  HFA) 108 (90 Base) MCG/ACT inhaler Inhale 2 puffs into the lungs every 6 (six) hours as needed (coughing). 08/05/23   Cyndi Shaver, PA-C  ALPRAZolam  (XANAX ) 0.5 MG tablet Take 1 tablet (0.5 mg total) by mouth 2 (two) times daily as needed for anxiety. 08/28/23   Drusilla Sabas RAMAN, MD  amLODipine  (NORVASC ) 5 MG tablet Take 1 tablet (5 mg total) by mouth daily. 08/29/23   Drusilla Sabas RAMAN, MD  aspirin  EC 81 MG tablet Take 81 mg by mouth 3 (three) times a week. Swallow whole.    [provider]  benzonatate  (TESSALON ) 200 MG capsule Take 1 capsule (200 mg total) by mouth 3 (three) times daily as needed for cough. 08/28/23   Drusilla Sabas RAMAN, MD  diphenoxylate -atropine  (LOMOTIL ) 2.5-0.025 MG tablet Take 2 tablets by mouth 3 (three) times daily as needed for diarrhea or loose stools. 07/07/23   [provider]  folic acid  (FOLVITE ) 400 MCG tablet Take 1 tablet by mouth every other day. 08/13/09   [provider]   furosemide  (LASIX ) 20 MG tablet Take 1 tablet (20 mg total) by mouth daily. Patient taking differently: Take 10 mg by mouth daily as needed for edema or fluid. 02/05/23   Antonio Cyndee [name], DO  ipratropium-albuterol  (DUONEB) 0.5-2.5 (3) MG/3ML SOLN Take 3 mLs by nebulization every 6 (six) hours as needed. 09/03/23   Lowne Chase, Yvonne R, DO  levalbuterol  (XOPENEX ) 1.25 MG/3ML nebulizer solution Take 1.25 mg by nebulization every 6 (six) hours as needed for wheezing or shortness of breath. 09/03/23   Lowne Chase, Yvonne R, DO  levothyroxine  (SYNTHROID ) 112 MCG tablet Take 1 tablet (112 mcg total) by mouth daily. 09/10/23   Antonio Cyndee [name], DO  losartan  (COZAAR ) 50 MG tablet Take 50 mg by mouth daily.    [provider]  magnesium  oxide (MAG-OX) 400 MG tablet Take 1 tablet (400 mg total) by mouth daily. 06/23/23   Duke, Jon Garre, PA  Multiple Vitamin (MULTIVITAMIN WITH MINERALS) TABS tablet Take 1 tablet by mouth once a week.    [provider]  mupirocin ointment (BACTROBAN) 2 % Apply 1 Application topically 2 (two) times daily. 08/09/23   [provider]  OFEV  100 MG CAPS Take 100 mg by mouth 2 (two) times daily. 06/19/22   [provider]  ondansetron  (ZOFRAN -ODT) 4 MG disintegrating tablet TAKE 1 TABLET BY MOUTH EVERY 8 HOURS AS NEEDED FOR NAUSEA AND VOMITING Patient  taking differently: Take 4 mg by mouth as needed for nausea or vomiting. 01/04/23   Webb, Padonda B, FNP  potassium chloride  (KLOR-CON  M) 20 MEQ tablet Take 1 tablet (20 mEq total) by mouth daily for 3 days. 09/14/23 09/17/23  Craig Alan SAUNDERS, PA-C  promethazine -dextromethorphan (PROMETHAZINE -DM) 6.25-15 MG/5ML syrup Take 5 mLs by mouth 4 (four) times daily as needed for cough. 08/07/23   Antonio Cyndee [name], DO  Respiratory Therapy Supplies (FULL KIT NEBULIZER SET) MISC Use with DUONEB for COPD treatment 09/03/23   Lowne Chase, Yvonne R, DO  vitamin C  (ASCORBIC ACID ) 500 MG tablet Take 500 mg  by mouth 2 (two) times a week.    [provider]    Allergies: Prednisone , Dilaudid  [hydromorphone ], Ipratropium bromide , Atrovent  hfa [ipratropium bromide  hfa], Cheratussin ac [guaifenesin -codeine ], Hydrocodone, Influenza vaccines, Morphine , Oxycodone , Codeine , Diltiazem , and Ibuprofen     Review of Systems  Gastrointestinal:  Positive for diarrhea.    Updated Vital Signs BP (!) 141/87 (BP Location: Right Arm)   Pulse 100   Temp 98.6 F (37 C)   Resp (!) 40   Ht 5' 4 (1.626 m)   Wt 57.6 kg   SpO2 91%   BMI 21.80 kg/m   Physical Exam Vitals and nursing note reviewed.  Constitutional:      General: She is not in acute distress.    Appearance: She is not toxic-appearing.  HENT:     Head: Normocephalic and atraumatic.     Mouth/Throat:     Comments: Appears dry  Eyes:     General: No scleral icterus.    Conjunctiva/sclera: Conjunctivae normal.    Cardiovascular:     Rate and Rhythm: Normal rate and regular rhythm.     Pulses: Normal pulses.     Heart sounds: Normal heart sounds.  Pulmonary:     Effort: Pulmonary effort is normal. No respiratory distress.     Breath sounds: Normal breath sounds.  Abdominal:     General: Abdomen is flat. Bowel sounds are normal. There is no distension.     Palpations: Abdomen is soft. There is no mass.     Tenderness: There is abdominal tenderness.   Musculoskeletal:     Right lower leg: No edema.     Left lower leg: No edema.   Skin:    General: Skin is warm and dry.     Findings: No lesion.   Neurological:     General: No focal deficit present.     Mental Status: She is alert and oriented to person, place, and time. Mental status is at baseline.     (all labs ordered are listed, but only abnormal results are displayed) Labs Reviewed  I-STAT VENOUS BLOOD GAS, ED - Abnormal; Notable for the following components:      Result Value   pH, Ven 7.468 (*)    pCO2, Ven 35.7 (*)    All other components within normal  limits  GASTROINTESTINAL PANEL BY PCR, STOOL (REPLACES STOOL CULTURE)  C DIFFICILE QUICK SCREEN W PCR REFLEX    CBC WITH DIFFERENTIAL/PLATELET  COMPREHENSIVE METABOLIC PANEL WITH GFR  LIPASE, BLOOD  URINALYSIS, ROUTINE W REFLEX MICROSCOPIC  LACTIC ACID, PLASMA  LACTIC ACID, PLASMA    EKG: None  Radiology: No results found.  {Document cardiac monitor, telemetry assessment procedure when appropriate:32947} Procedures   Medications Ordered in the ED  sodium chloride  0.9 % bolus 1,000 mL (1,000 mLs Intravenous New Bag/Given 09/26/23 2210)  ondansetron  (ZOFRAN ) injection 4 mg (  4 mg Intravenous Given 09/26/23 2224)  fentaNYL  (SUBLIMAZE ) injection 25 mcg (25 mcg Intravenous Given 09/26/23 2225)      {Click here for ABCD2, HEART and other calculators REFRESH Note before signing:1}                              Medical Decision Making Amount and/or Complexity of Data Reviewed Labs: ordered. Radiology: ordered.  Risk Prescription drug management.   This patient presents to the ED for concern of abdominal pain, this involves an extensive number of treatment options, and is a complaint that carries with it a high risk of complications and morbidity.  The differential diagnosis includes colitis, diverticulitis, appendicitis, urinary tract infection, pancreatitis, gastroenteritis   Co morbidities that complicate the patient evaluation  History of recent pneumonia and treatment   Additional history obtained:  Additional history obtained from reviewed recent hospital stay in which patient was treated for right lower lobe pneumonia on antibiotic   Lab Tests:  I personally interpreted labs.  The pertinent results include:  CBC without leukocytosis.  No significant anemia. *** GI panel and C. difficile panel pending ***   Imaging Studies ordered:  I ordered imaging studies including CT abdomen and pelvis I independently visualized and interpreted imaging which showed *** I  agree with the radiologist interpretation   Cardiac Monitoring: / EKG:  The patient was maintained on a cardiac monitor.    Consultations Obtained:  I requested consultation with the ***,  and discussed lab and imaging findings as well as pertinent plan - they recommend: ***   Problem List / ED Course / Critical interventions / Medication management  *** I ordered medication including normal saline, Zofran , fentanyl  Reevaluation of the patient after these medicines showed that the patient {resolved/improved/worsened:23923::improved} I have reviewed the patients home medicines and have made adjustments as needed   Plan  F/u w/ PCP in 2-3d to ensure resolution of sx.  Patient was given return precautions. Patient stable for discharge at this time.  Patient educated on sx/dx and verbalized understanding of plan. Return to ER w/ new or worsening sx.    {Document critical care time when appropriate  Document review of labs and clinical decision tools ie CHADS2VASC2, etc  Document your independent review of radiology images and any outside records  Document your discussion with family members, caretakers and with consultants  Document social determinants of health affecting pt's care  Document your decision making why or why not admission, treatments were needed:32947:::1}   Final diagnoses:  None    ED Discharge Orders     None

## 2023-09-27 ENCOUNTER — Telehealth: Payer: Self-pay | Admitting: Neurology

## 2023-09-27 ENCOUNTER — Telehealth: Payer: Self-pay | Admitting: Physician Assistant

## 2023-09-27 ENCOUNTER — Ambulatory Visit: Payer: Self-pay

## 2023-09-27 DIAGNOSIS — R918 Other nonspecific abnormal finding of lung field: Secondary | ICD-10-CM | POA: Diagnosis not present

## 2023-09-27 DIAGNOSIS — K529 Noninfective gastroenteritis and colitis, unspecified: Secondary | ICD-10-CM | POA: Diagnosis not present

## 2023-09-27 DIAGNOSIS — I700 Atherosclerosis of aorta: Secondary | ICD-10-CM | POA: Diagnosis not present

## 2023-09-27 DIAGNOSIS — R197 Diarrhea, unspecified: Secondary | ICD-10-CM

## 2023-09-27 LAB — RESP PANEL BY RT-PCR (RSV, FLU A&B, COVID)  RVPGX2
Influenza A by PCR: NEGATIVE
Influenza B by PCR: NEGATIVE
Resp Syncytial Virus by PCR: NEGATIVE
SARS Coronavirus 2 by RT PCR: NEGATIVE

## 2023-09-27 MED ORDER — OXYCODONE-ACETAMINOPHEN 5-325 MG PO TABS: 1.0000 | ORAL_TABLET | Freq: Four times a day (QID) | ORAL | 0 refills | Status: DC | PRN

## 2023-09-27 MED ORDER — COLESTIPOL HCL 1 G PO TABS: 1.0000 g | ORAL_TABLET | Freq: Every day | ORAL | 0 refills | Status: DC

## 2023-09-27 NOTE — Discharge Instructions (Addendum)
 Follow-up with primary care and GI doctor.  Take Imodium  or Pepto-Bismol as needed for loose stool.  Make sure staying well-hydrated alternating Gatorade or Pedialyte drink.  Return to emergency room with your symptoms.

## 2023-09-27 NOTE — Telephone Encounter (Signed)
 FYI Only or Action Required?: Action required by provider: request for appointment and update on patient condition.  Patient was last seen in primary care on 09/17/2023 by Antonio Meth, [name], DO. Called Nurse Triage reporting Diarrhea. Symptoms began several weeks ago. Interventions attempted: Prescription medications: Flagyl   and Rest, hydration, or home remedies. Symptoms are: diarrhea, dehydration, decreased appetite (unable to eat or drink), chills, fever 101.2 (4-5 days ago), nausea unchanged.  Triage Disposition: Call PCP Now (overriding Go to ED or PCP/Alternative with Approval)  Patient/caregiver understands and will follow disposition?: Yes                     Reason for Triage: Patient called in seeking to speak directly with Dr.Chase and stated that she sent messages with no response back. Patient went to the ER yesterday for explosive diarrhea and was told to follow up next day with provider. Advised patient that I could schedule her to see another provider because appts showed schedule out to July for Dr. Meth. Patient is asking for a call back from nurse because she would like to know what to do next. She said she is too tired to leave out the house. Patient contact is [phone].   Reason for Disposition  [1] Drinking very little AND [2] dehydration suspected (e.g., no urine > 12 hours, very dry mouth, very lightheaded)  Answer Assessment - Initial Assessment Questions Patient was prescribed colestipol and oxycodone -acetaminophen  by ED yesterday and she states her husband is going to pick up the prescriptions today.   1. ANTIBIOTIC: What antibiotic are you taking? How many times per day?     Flagyl  500mg  three times daily. Completed.  2. ANTIBIOTIC ONSET: When was the antibiotic started?     Several weeks ago.  3. DIARRHEA SEVERITY: How bad is the diarrhea? How many more stools have you had in the past 24 hours than normal?    - NO DIARRHEA  (SCALE 0)   - MILD (SCALE 1-3): Few loose or mushy BMs; increase of 1-3 stools over normal daily number of stools; mild increase in ostomy output.   -  MODERATE (SCALE 4-7): Increase of 4-6 stools daily over normal; moderate increase in ostomy output. * SEVERE (SCALE 8-10; OR 'WORST POSSIBLE'): Increase of 7 or more stools daily over normal; moderate increase in ostomy output; incontinence.     Patient states baseline is 1 BM daily and now it is 5-7 more than baseline. Moderate.  4. ONSET: When did the diarrhea begin?      Over 1 month.  5. BM CONSISTENCY: How loose or watery is the diarrhea?      Watery, looks like jello or chocolate pudding.  6. VOMITING: Are you also vomiting? If Yes, ask: How many times in the past 24 hours?      No.  7. ABDOMEN PAIN: Are you having any abdomen pain? If Yes, ask: What does it feel like? (e.g., crampy, dull, intermittent, constant)      Yes, cramping when having a bowel movement (intermittent).  8. ABDOMEN PAIN SEVERITY: If present, ask: How bad is the pain?  (e.g., Scale 1-10; mild, moderate, or severe)   - MILD (1-3): doesn't interfere with normal activities, abdomen soft and not tender to touch    - MODERATE (4-7): interferes with normal activities or awakens from sleep, abdomen tender to touch    - SEVERE (8-10): excruciating pain, doubled over, unable to do any normal activities  Severe. She states it makes her lose her breath.  9. ORAL INTAKE: If vomiting, Have you been able to drink liquids? How much liquids have you had in the past 24 hours?     No vomiting, she states she is drinking no liquids. She states she had a sip of water  last night in the ED.  10. HYDRATION: Any signs of dehydration? (e.g., dry mouth [not just dry lips], too weak to stand, dizziness, new weight loss) When did you last urinate?       Yes: weight loss, sticky mouth, dizziness/weakness . Last time she urinated was this morning.  11. EXPOSURE:  Have you traveled to a foreign country recently? Have you been exposed to anyone with diarrhea? Could you have eaten any food that was spoiled?       No.  12. OTHER SYMPTOMS: Do you have any other symptoms? (e.g., fever, blood in stool)       Patient states she can't eat or drink, weight loss, nausea, chills, fever 101.2 (4-5 days ago), she states she had blood in stool on exam by Dr Cruz.  13. PREGNANCY: Is there any chance you are pregnant? When was your last menstrual period?       N/A.  Protocols used: Diarrhea on Antibiotics-A-AH

## 2023-09-27 NOTE — Telephone Encounter (Signed)
 Copied from CRM [id number]. Topic: General - Other >> Sep 24, 2023  4:16 PM Fonda T wrote: Reason for CRM: Patient calling to check status of previous message regarding medication Flagyl , and poop that has not gotten any better.   Called and spoke to front office. Clinical staff unavailable at time of call.  Patient is requesting a return call today to discuss further. Patient can be reached to [phone]

## 2023-09-27 NOTE — Telephone Encounter (Signed)
 Patient called and stated that she was seen in the ED last night due to her not being able to stop pooping and is in a lot of pain. ED recommended that she follow up with us  to see if her EGD can be moved up sooner. Patient is requesting a call back. Please advise.

## 2023-09-27 NOTE — Telephone Encounter (Signed)
 Alan, please review ER notes and advise. Patient was doing better on Flagyl  last week when we spoke. 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

## 2023-09-27 NOTE — ED Provider Notes (Signed)
 Care assumed at shift change. Here for diarrhea and abdominal cramping. Had pneumonia a few weeks ago but diarrhea predated that. 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 No fever. Labs are reassuring. CT neg for abdominal complications. Awaiting CXR at shift change.  Physical Exam  BP (!) 176/65 (BP Location: Right Arm)   Pulse 83   Temp 98.6 F (37 C)   Resp 16   Ht 5' 4 (1.626 m)   Wt 57.6 kg   SpO2 95%   BMI 21.80 kg/m   Physical Exam  Procedures  Procedures  ED Course / MDM   Clinical Course as of 09/27/23 0101  Mon Sep 27, 2023  0055 Covid/Flu/RSV swab is neg. I personally viewed the images from radiology studies and agree with radiologist interpretation: CXR without acute change. Discussed findings with patient in detail. No indication for admission at this time. Will give Rx for pain medication, trial of colestipol for chronic diarrhea. Recommend outpatient PCP and GI follow up for long term management. [CS]  0100 Patient has had itching with opioids in the past, but tolerated well in the ED.  [CS]    Clinical Course User Index [CS] Roselyn Carlin NOVAK, MD   Medical Decision Making Amount and/or Complexity of Data Reviewed Labs: ordered. Radiology: ordered.  Risk Prescription drug management.          Roselyn Carlin NOVAK, MD 09/27/23 [phone]

## 2023-09-28 ENCOUNTER — Telehealth (INDEPENDENT_AMBULATORY_CARE_PROVIDER_SITE_OTHER): Admitting: Family Medicine

## 2023-09-28 ENCOUNTER — Ambulatory Visit: Admitting: Family Medicine

## 2023-09-28 DIAGNOSIS — J0141 Acute recurrent pansinusitis: Secondary | ICD-10-CM

## 2023-09-28 DIAGNOSIS — M950 Acquired deformity of nose: Secondary | ICD-10-CM | POA: Diagnosis not present

## 2023-09-28 MED ORDER — AMOXICILLIN-POT CLAVULANATE 875-125 MG PO TABS
1.0000 | ORAL_TABLET | Freq: Two times a day (BID) | ORAL | 0 refills | Status: DC
Start: 2023-09-28 — End: 2023-10-28

## 2023-09-28 NOTE — Telephone Encounter (Signed)
 Pt has virtual this evening

## 2023-09-28 NOTE — Telephone Encounter (Signed)
 Patient was just prescribed Colestid yesterday. Sent MyChart message with recommendations.

## 2023-09-28 NOTE — Progress Notes (Signed)
 MyChart Video Visit     to get the patient set up on a video visit.  Patient location: home Patient and provider in visit Provider location: Office  I discussed the limitations of evaluation and management by telemedicine and the availability of in person appointments. The patient expressed understanding and agreed to proceed.  Visit Date: 09/28/2023  Today's healthcare provider: [name] Antonio Cyndee, DO     Subjective:    Patient ID: Alexandria [name], female    DOB: 10-20-1945, 78 y.o.   MRN: [id number]  No chief complaint on file.   HPI Patient is in today for c/o congestion .   Discussed the use of AI scribe software for clinical note transcription with the patient, who gave verbal consent to proceed.  History of Present Illness Alexandria [name] is a 78 year old female who presents with persistent sinus congestion and headaches.  She has been experiencing ongoing severe sinus congestion and nasal blockage. Despite recent removal of stitches from her nose, she continues to suffer from significant congestion. She is currently using a nasal spray for relief but has not yet tried over-the-counter Sudafed.  She experiences constant sinus headaches, which are significantly impacting her daily life, making it difficult for her to concentrate and function normally.  She recently visited the ER due to diarrhea and was prescribed a new medication, possibly cholestyramine, which she started two days ago. She was also advised to use Imodium  as needed. During this visit, she was concerned about pneumonia. She reports an inability to perform a fecal test due to insufficient sample and has noticed red discoloration in her urine, described as 'orangey reddish'.      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 the left ventricular wall motion is normal   Hypothyroidism    IPF (idiopathic pulmonary fibrosis) (HCC) 01/2018   Myocardial infarction (HCC)    hx of 30 years ago   Pancreatitis    TIA (transient ischemic attack)    8 years ago    Past Surgical History:  Procedure Laterality Date   BREAST BIOPSY Left    [facility]   CARDIAC CATHETERIZATION     10/2013   CATARACT EXTRACTION Bilateral 03/22/2018   FLEXIBLE SIGMOIDOSCOPY N/A 08/13/2022   Procedure: FLEXIBLE SIGMOIDOSCOPY;  Surgeon: Sheldon Standing, MD;  Location: [facility];  Service: General;  Laterality: N/A;   ILEOSTOMY CLOSURE N/A 11/19/2022   Procedure: OPEN TAKEDOWN OF LOOP ILEOSTOMY;  Surgeon: Sheldon Standing, MD;  Location: [facility];  Service: General;  Laterality: N/A;   LEFT HEART CATHETERIZATION WITH CORONARY ANGIOGRAM N/A 10/25/2013   Procedure: LEFT HEART CATHETERIZATION WITH CORONARY ANGIOGRAM;  Surgeon: Debby DELENA Sor, MD;  Location: [facility] CATH LAB;  Service: Cardiovascular;  Laterality: N/A;   LYSIS OF ADHESION N/A 08/13/2022   Procedure: LYSIS OF ADHESION, DIVERTING ILEOSTOMY;  Surgeon: Sheldon Standing, MD;  Location: [facility];  Service: General;  Laterality: N/A;   NECK SURGERY     ACDF x 2      done  in [place] more than 25 years, doesn't know levels   RHINOPLASTY  08/16/2023   uses A[place] in her nostril   TOTAL KNEE ARTHROPLASTY     Bilateral x's 2   VAGINAL HYSTERECTOMY  10/17/1998   Alexandria [name]   VIDEO BRONCHOSCOPY Bilateral 01/24/2018   Procedure: VIDEO BRONCHOSCOPY WITH FLUORO;  Surgeon: Alaine Vicenta NOVAK, MD;  Location: [facility] ENDOSCOPY;  Service: Cardiopulmonary;  Laterality: Bilateral;   XI ROBOTIC ASSISTED COLOSTOMY TAKEDOWN N/A 08/13/2022   Procedure: ROBOTIC OSTOMY TAKEDOWN, SMALL BOWEL RESECTION X  2, BILATERAL TAP BLOCK, TISSUE PERFUSSION ASSESMENT VIA FIREFLY, MOBILAZATION OF SPLENIC FLEXURE;  Surgeon: Sheldon Standing, MD;  Location: [facility];  Service: General;  Laterality: N/A;   XI ROBOTIC ASSISTED LOWER ANTERIOR RESECTION N/A 03/31/2022   Procedure: XI ROBOTIC ASSISTED LOWER ANTERIOR RESECTION;  Surgeon: Sheldon Standing, MD;  Location: [facility];  Service: General;  Laterality: N/A;    Family History  Problem Relation Age of Onset   Ovarian cancer Mother    Uterine cancer Mother    Lung cancer Father    HIV Brother        92   Kidney cancer Brother    Lung cancer Brother    Other Brother        Mouth Cancer   Colon cancer Maternal Aunt    Heart disease Maternal Grandmother    Stroke Maternal Grandmother    Hypertension Maternal Grandmother    Diabetes Maternal Grandmother    Arthritis Other    Esophageal cancer Neg Hx    Liver disease Neg Hx    Rectal cancer Neg Hx    Stomach cancer Neg Hx     Social History   Socioeconomic History   Marital status: Married    Spouse name: Alexandria [name]   Number of children: 3   Years of education: Jr college   Highest education level: Not on file  Occupational History   Occupation: Leisure centre manager: UNEMPLOYED   Occupation: retired  Tobacco Use   Smoking status: Never   Smokeless tobacco: Never  Vaping Use   Vaping status: Never Used  Substance and Sexual Activity   Alcohol  use: No    Alcohol /week: 0.0 standard drinks of alcohol    Drug use: No   Sexual activity: Not Currently  Other Topics Concern   Not on file  Social History Narrative   Lives with husband Alexandria [name]   Caffeine use: coffee (2 cups per day)   Mostly right-handed   Social Drivers of Corporate investment banker Strain: Not on file  Food Insecurity: No Food Insecurity (08/31/2023)   Hunger Vital Sign    Worried About Running Out of Food in the Last Year: Never true    Ran Out of Food in the Last Year: Never true  Transportation Needs: No Transportation Needs  (08/31/2023)   PRAPARE - Administrator, Civil Service (Medical): No    Lack of Transportation (Non-Medical): No  Physical Activity: Not on file  Stress: Not on file  Social Connections: Unknown (08/25/2023)   Social Connection and Isolation Panel    Frequency of Communication with Friends and Family: Patient declined    Frequency of Social Gatherings with Friends and Family: Patient declined    Attends Religious Services: Patient declined    Database administrator or Organizations: Patient declined    Attends Banker Meetings: Patient declined    Marital Status: Married  Catering manager Violence: Not At Risk (  08/31/2023)   Humiliation, Afraid, Rape, and Kick questionnaire    Fear of Current or Ex-Partner: No    Emotionally Abused: No    Physically Abused: No    Sexually Abused: No    Outpatient Medications Prior to Visit  Medication Sig Dispense Refill   albuterol  (VENTOLIN  HFA) 108 (90 Base) MCG/ACT inhaler Inhale 2 puffs into the lungs every 6 (six) hours as needed (coughing). 8 g 1   ALPRAZolam  (XANAX ) 0.5 MG tablet Take 1 tablet (0.5 mg total) by mouth 2 (two) times daily as needed for anxiety. 10 tablet 0   amLODipine  (NORVASC ) 5 MG tablet Take 1 tablet (5 mg total) by mouth daily. 30 tablet 2   aspirin  EC 81 MG tablet Take 81 mg by mouth 3 (three) times a week. Swallow whole.     benzonatate  (TESSALON ) 200 MG capsule Take 1 capsule (200 mg total) by mouth 3 (three) times daily as needed for cough. 20 capsule 3   colestipol (COLESTID) 1 g tablet Take 1 tablet (1 g total) by mouth daily. 30 tablet 0   diphenoxylate -atropine  (LOMOTIL ) 2.5-0.025 MG tablet Take 2 tablets by mouth 3 (three) times daily as needed for diarrhea or loose stools.     folic acid  (FOLVITE ) 400 MCG tablet Take 1 tablet by mouth every other day.     furosemide  (LASIX ) 20 MG tablet Take 1 tablet (20 mg total) by mouth daily. (Patient taking differently: Take 10 mg by mouth daily as needed  for edema or fluid.) 30 tablet 3   ipratropium-albuterol  (DUONEB) 0.5-2.5 (3) MG/3ML SOLN Take 3 mLs by nebulization every 6 (six) hours as needed. 360 mL 1   levalbuterol  (XOPENEX ) 1.25 MG/3ML nebulizer solution Take 1.25 mg by nebulization every 6 (six) hours as needed for wheezing or shortness of breath. 150 mL 1   levothyroxine  (SYNTHROID ) 112 MCG tablet Take 1 tablet (112 mcg total) by mouth daily. 30 tablet 2   losartan  (COZAAR ) 50 MG tablet Take 50 mg by mouth daily.     magnesium  oxide (MAG-OX) 400 MG tablet Take 1 tablet (400 mg total) by mouth daily. 90 tablet 3   Multiple Vitamin (MULTIVITAMIN WITH MINERALS) TABS tablet Take 1 tablet by mouth once a week.     mupirocin ointment (BACTROBAN) 2 % Apply 1 Application topically 2 (two) times daily.     OFEV  100 MG CAPS Take 100 mg by mouth 2 (two) times daily.     ondansetron  (ZOFRAN -ODT) 4 MG disintegrating tablet TAKE 1 TABLET BY MOUTH EVERY 8 HOURS AS NEEDED FOR NAUSEA AND VOMITING (Patient taking differently: Take 4 mg by mouth as needed for nausea or vomiting.) 60 tablet 2   oxyCODONE -acetaminophen  (PERCOCET/ROXICET) 5-325 MG tablet Take 1 tablet by mouth every 6 (six) hours as needed for severe pain (pain score 7-10). 15 tablet 0   potassium chloride  (KLOR-CON  M) 20 MEQ tablet Take 1 tablet (20 mEq total) by mouth daily for 3 days. 3 tablet 0   promethazine -dextromethorphan (PROMETHAZINE -DM) 6.25-15 MG/5ML syrup Take 5 mLs by mouth 4 (four) times daily as needed for cough. 118 mL 0   Respiratory Therapy Supplies (FULL KIT NEBULIZER SET) MISC Use with DUONEB for COPD treatment 1 each 0   vitamin C  (ASCORBIC ACID ) 500 MG tablet Take 500 mg by mouth 2 (two) times a week.     No facility-administered medications prior to visit.    Allergies  Allergen Reactions   Prednisone  Other (See Comments)    Was prescribed  high doses at other times   Dilaudid  [Hydromorphone ] Itching and Other (See Comments)    Dilaudid  caused marked confusion    Ipratropium Bromide  Itching and Other (See Comments)    Mental Status Changes, Insomnia  Insomnia - Atrovent .   Atrovent  Hfa [Ipratropium Bromide  Hfa] Itching and Other (See Comments)    Pt could not sleep, Insomnia    Cheratussin Ac [Guaifenesin -Codeine ] Other (See Comments)    Headaches    Hydrocodone Itching   Influenza Vaccines Other (See Comments)    Pt reports heart attack after last flu shot   Morphine  Itching   Oxycodone  Itching   Codeine  Itching, Nausea Only and Other (See Comments)    Pt takes promethazine  with codeine  at home   Diltiazem  Itching and Other (See Comments)    Makes patient sick. Shakes. GI   Ibuprofen  Itching and Other (See Comments)    Gives false reading in blood    Review of Systems  Constitutional:  Positive for malaise/fatigue. Negative for chills and fever.  HENT:  Positive for congestion and sinus pain. Negative for sore throat.   Respiratory:  Negative for cough.   Psychiatric/Behavioral:  Negative for depression, hallucinations, memory loss, substance abuse and suicidal ideas. The patient is not nervous/anxious and does not have insomnia.        Objective:    Physical Exam Vitals and nursing note reviewed.  HENT:     Nose:     Right Sinus: Maxillary sinus tenderness present. No frontal sinus tenderness.     Left Sinus: Maxillary sinus tenderness present. No frontal sinus tenderness.     There were no vitals taken for this visit. Wt Readings from Last 3 Encounters:  09/26/23 127 lb (57.6 kg)  09/17/23 134 lb (60.8 kg)  09/13/23 133 lb 6 oz (60.5 kg)       Assessment & Plan:  Acute recurrent pansinusitis -     Amoxicillin -Pot Clavulanate; Take 1 tablet by mouth 2 (two) times daily.  Dispense: 20 tablet; Refill: 0   Assessment and Plan Assessment & Plan Chronic Sinusitis   Persistent sinus congestion and headaches continue despite recent nasal surgery to remove obstructions. Symptoms are not related to the surgery according to  the surgeon. She desires symptom relief. Prescribe Augmentin . Recommend continued use of Sudafed and nasal spray as advised by the surgeon. Follow up in one month.  Diarrhea   Ongoing diarrhea with a recent ER visit where cholestyramine was prescribed and advised to use Imodium  as needed. On the second day of new medication. There is concern about potential blood in stool, but the fecal test was inconclusive due to insufficient sample. Reports reddish urine, possibly related to diarrhea or another issue. Continue current medication regimen as prescribed by ER. Use Imodium  as needed for diarrhea. Monitor symptoms and report if no improvement.  Hematuria   Reddish urine suggests possible hematuria. The cause of discoloration is unclear and may require further investigation to determine relation to diarrhea or another condition. Monitor urine color and report any changes or persistence of symptoms.    I discussed the assessment and treatment plan with the patient. The patient was provided an opportunity to ask questions and all were answered. The patient agreed with the plan and demonstrated an understanding of the instructions.   The patient was advised to call back or seek an in-person evaluation if the symptoms worsen or if the condition fails to improve as anticipated.  [name] Antonio Cyndee, DO [facility] [phone] (phone) [phone] (fax)  [facility]

## 2023-09-28 NOTE — Telephone Encounter (Signed)
 See if patient is still on colestipol? Can increase to 2 pills in the morning and 2 pills in the evening total but slowly increase to this amount. Can cause constipation, suggest follow up with [facility]

## 2023-09-29 NOTE — Telephone Encounter (Signed)
 PT returning call to have a nurse discuss the information provided on yesterday. She wants to have an earlier EGD and I advised there were no earlier appointments. Please advise.

## 2023-09-29 NOTE — Telephone Encounter (Signed)
 Patient returning call.

## 2023-09-29 NOTE — Telephone Encounter (Signed)
 Pt made aware aware of the my chart message and recommendations that was sent by the provider.  Pt was rescheduled to a sooner date for her EGD 10/12/2023 at 2:00 PM. Pt made aware. Updated prep instructions were sent to pt via my chart.  Pt verbalized understanding with all questions answered.

## 2023-09-29 NOTE — Telephone Encounter (Signed)
 Left message for pt to call back

## 2023-09-30 ENCOUNTER — Other Ambulatory Visit: Payer: Self-pay

## 2023-09-30 ENCOUNTER — Encounter (HOSPITAL_COMMUNITY): Payer: Self-pay

## 2023-09-30 ENCOUNTER — Emergency Department (HOSPITAL_COMMUNITY)

## 2023-09-30 ENCOUNTER — Ambulatory Visit: Payer: Self-pay

## 2023-09-30 ENCOUNTER — Ambulatory Visit: Admitting: Gastroenterology

## 2023-09-30 ENCOUNTER — Emergency Department (HOSPITAL_COMMUNITY): Admission: EM | Admit: 2023-09-30 | Discharge: 2023-09-30 | Disposition: A | Discharge status: Home / Self Care

## 2023-09-30 DIAGNOSIS — R197 Diarrhea, unspecified: Secondary | ICD-10-CM | POA: Insufficient documentation

## 2023-09-30 DIAGNOSIS — Z79899 Other long term (current) drug therapy: Secondary | ICD-10-CM | POA: Diagnosis not present

## 2023-09-30 DIAGNOSIS — R109 Unspecified abdominal pain: Secondary | ICD-10-CM

## 2023-09-30 DIAGNOSIS — R1084 Generalized abdominal pain: Secondary | ICD-10-CM | POA: Diagnosis not present

## 2023-09-30 LAB — COMPREHENSIVE METABOLIC PANEL WITH GFR
ALT: 11 U/L (ref 0–44)
AST: 20 U/L (ref 15–41)
Albumin: 3.5 g/dL (ref 3.5–5.0)
Alkaline Phosphatase: 64 U/L (ref 38–126)
Anion gap: 9 (ref 5–15)
BUN: 8 mg/dL (ref 8–23)
CO2: 25 mmol/L (ref 22–32)
Calcium: 8.9 mg/dL (ref 8.9–10.3)
Chloride: 105 mmol/L (ref 98–111)
Creatinine, Ser: 0.7 mg/dL (ref 0.44–1.00)
GFR, Estimated: >60 mL/min (ref >60)
Glucose, Bld: 102 mg/dL — ABNORMAL HIGH (ref 70–99)
Potassium: 3.8 mmol/L (ref 3.5–5.1)
Sodium: 139 mmol/L (ref 135–145)
Total Bilirubin: 0.9 mg/dL (ref 0.0–1.2)
Total Protein: 7.4 g/dL (ref 6.5–8.1)

## 2023-09-30 LAB — MAGNESIUM: Magnesium: 2.1 mg/dL (ref 1.7–2.4)

## 2023-09-30 LAB — I-STAT CG4 LACTIC ACID, ED
Lactic Acid, Venous: 1.4 mmol/L (ref 0.5–1.9)
Lactic Acid, Venous: 1.5 mmol/L (ref 0.5–1.9)

## 2023-09-30 LAB — CBC
HCT: 44.6 % (ref 36.0–46.0)
Hemoglobin: 14.3 g/dL (ref 12.0–15.0)
MCH: 31.2 pg (ref 26.0–34.0)
MCHC: 32.1 g/dL (ref 30.0–36.0)
MCV: 97.4 fL (ref 80.0–100.0)
Platelets: 247 10*3/uL (ref 150–400)
RBC: 4.58 MIL/uL (ref 3.87–5.11)
RDW: 13.8 % (ref 11.5–15.5)
WBC: 6.8 10*3/uL (ref 4.0–10.5)
nRBC: 0 % (ref 0.0–0.2)

## 2023-09-30 LAB — LIPASE, BLOOD: Lipase: 23 U/L (ref 11–51)

## 2023-09-30 MED ORDER — FENTANYL CITRATE PF 50 MCG/ML IJ SOSY
25.0000 ug | PREFILLED_SYRINGE | Freq: Once | INTRAMUSCULAR | Status: AC
  Administered 2023-09-30: 25 ug via INTRAVENOUS
  Filled 2023-09-30: qty 1

## 2023-09-30 MED ORDER — DICYCLOMINE HCL 20 MG PO TABS: 20.0000 mg | ORAL_TABLET | Freq: Two times a day (BID) | ORAL | 0 refills | Status: DC

## 2023-09-30 MED ORDER — ONDANSETRON HCL 4 MG/2ML IJ SOLN
4.0000 mg | Freq: Once | INTRAMUSCULAR | Status: AC
  Administered 2023-09-30: 4 mg via INTRAVENOUS
  Filled 2023-09-30: qty 2

## 2023-09-30 MED ORDER — LACTATED RINGERS IV BOLUS
1000.0000 mL | Freq: Once | INTRAVENOUS | Status: AC
  Administered 2023-09-30: 1000 mL via INTRAVENOUS

## 2023-09-30 MED ORDER — ALUM & MAG HYDROXIDE-SIMETH 200-200-20 MG/5ML PO SUSP
30.0000 mL | Freq: Once | ORAL | Status: AC
  Administered 2023-09-30: 30 mL via ORAL
  Filled 2023-09-30: qty 30

## 2023-09-30 MED ORDER — ONDANSETRON 4 MG PO TBDP: 4.0000 mg | ORAL_TABLET | Freq: Three times a day (TID) | ORAL | 0 refills | Status: DC | PRN

## 2023-09-30 MED ORDER — DICYCLOMINE HCL 10 MG/5ML PO SOLN
10.0000 mg | Freq: Once | ORAL | Status: AC
  Administered 2023-09-30: 10 mg via ORAL
  Filled 2023-09-30: qty 5

## 2023-09-30 MED ORDER — IOHEXOL 300 MG/ML  SOLN
100.0000 mL | Freq: Once | INTRAMUSCULAR | Status: AC | PRN
  Administered 2023-09-30: 100 mL via INTRAVENOUS

## 2023-09-30 NOTE — ED Triage Notes (Signed)
 Pt presents to ED from home C/O abdominal pain, diarrhea X 4 weeks. Pt appears uncomfortable in triage.

## 2023-09-30 NOTE — Telephone Encounter (Signed)
 FYI Only or Action Required?: Action required by provider: clinical question for provider and update on patient condition.  Patient was last seen in primary care on 09/28/2023 by Antonio Meth, [name], DO. Called Nurse Triage reporting Abdominal Pain. Symptoms began about a month ago. Interventions attempted: Prescription medications: oxycodone  and Rest, hydration, or home remedies. Symptoms are: gradually worsening.  Triage Disposition: Go to ED Now (Notify PCP)  Patient/caregiver understands and will follow disposition?: No, wishes to speak with PCP       Copied from CRM [id number]. Topic: Clinical - Red Word Triage >> Sep 30, 2023  8:57 AM Burnard DEL wrote: Red Word that prompted transfer to Nurse Triage: pain under ribs/ diarrhea Reason for Disposition  [1] SEVERE pain (e.g., excruciating) AND [2] present > 1 hour  Answer Assessment - Initial Assessment Questions 1. LOCATION: Where does it hurt?      Under left rib upper abd 2. RADIATION: Does the pain shoot anywhere else? (e.g., chest, back)     no 3. ONSET: When did the pain begin? (e.g., minutes, hours or days ago)      months 4. SUDDEN: Gradual or sudden onset?     gradual 5. PATTERN Does the pain come and go, or is it constant?    - If it comes and goes: How long does it last? Do you have pain now?     (Note: Comes and goes means the pain is intermittent. It goes away completely between bouts.)    - If constant: Is it getting better, staying the same, or getting worse?      (Note: Constant means the pain never goes away completely; most serious pain is constant and gets worse.)     constant 6. SEVERITY: How bad is the pain?  (e.g., Scale 1-10; mild, moderate, or severe)    - MILD (1-3): Doesn't interfere with normal activities, abdomen soft and not tender to touch..     - MODERATE (4-7): Interferes with normal activities or awakens from sleep, abdomen tender to touch.     - SEVERE (8-10): Excruciating pain,  doubled over, unable to do any normal activities.       10/10 or 55 7. RECURRENT SYMPTOM: Have you ever had this type of stomach pain before? If Yes, ask: When was the last time? and What happened that time?      daily 8. AGGRAVATING FACTORS: Does anything seem to cause this pain? (e.g., foods, stress, alcohol )     no 9. CARDIAC SYMPTOMS: Do you have any of the following symptoms: chest pain, difficulty breathing, sweating, nausea?     no 10. OTHER SYMPTOMS: Do you have any other symptoms? (e.g., back pain, diarrhea, fever, urination pain, vomiting)       Diarrhea 10-12 stools daily  Protocols used: Abdominal Pain - Upper-A-AH

## 2023-09-30 NOTE — ED Provider Notes (Signed)
 [place] EMERGENCY DEPARTMENT AT [facility] Provider Note   CSN: [id number] Arrival date & time: 09/30/23  1155     Patient presents with: Abdominal Pain and Diarrhea   Alexandria [name] is a 78 y.o. female.   Noticed is a 78 year old female presents to emergency department with generalized abdominal pain with cramping.  Reports that she has had loose stools for approximately 4 weeks.  No lightheadedness dizziness.  No chest pain or shortness of breath.  Reports multiple nonbloody bowel movements outpatient.  Seeing GI and is planned to have upper endoscopy on July 8.  Tolerating p.o., no nausea vomiting.   Abdominal Pain Associated symptoms: diarrhea   Diarrhea Associated symptoms: abdominal pain        Prior to Admission medications   Medication Sig Start Date End Date Taking? Authorizing Provider  dicyclomine  (BENTYL ) 20 MG tablet Take 1 tablet (20 mg total) by mouth 2 (two) times daily. 09/30/23  Yes Neysa Caron JINNY, DO  ondansetron  (ZOFRAN -ODT) 4 MG disintegrating tablet Take 1 tablet (4 mg total) by mouth every 8 (eight) hours as needed for nausea or vomiting. 09/30/23  Yes Neysa Caron JINNY, DO  albuterol  (VENTOLIN  HFA) 108 (90 Base) MCG/ACT inhaler Inhale 2 puffs into the lungs every 6 (six) hours as needed (coughing). 08/05/23   Cyndi Shaver, PA-C  ALPRAZolam  (XANAX ) 0.5 MG tablet Take 1 tablet (0.5 mg total) by mouth 2 (two) times daily as needed for anxiety. 08/28/23   Drusilla Sabas RAMAN, MD  amLODipine  (NORVASC ) 5 MG tablet Take 1 tablet (5 mg total) by mouth daily. 08/29/23   Drusilla Sabas RAMAN, MD  amoxicillin -clavulanate (AUGMENTIN ) 875-125 MG tablet Take 1 tablet by mouth 2 (two) times daily. 09/28/23   Antonio Cyndee [name], DO  aspirin  EC 81 MG tablet Take 81 mg by mouth 3 (three) times a week. Swallow whole.    [provider]  benzonatate  (TESSALON ) 200 MG capsule Take 1 capsule (200 mg total) by mouth 3 (three) times daily as needed for cough. 08/28/23    Drusilla Sabas RAMAN, MD  colestipol (COLESTID) 1 g tablet Take 1 tablet (1 g total) by mouth daily. 09/27/23 10/27/23  Roselyn Carlin NOVAK, MD  diphenoxylate -atropine  (LOMOTIL ) 2.5-0.025 MG tablet Take 2 tablets by mouth 3 (three) times daily as needed for diarrhea or loose stools. 07/07/23   [provider]  folic acid  (FOLVITE ) 400 MCG tablet Take 1 tablet by mouth every other day. 08/13/09   [provider]  furosemide  (LASIX ) 20 MG tablet Take 1 tablet (20 mg total) by mouth daily. Patient taking differently: Take 10 mg by mouth daily as needed for edema or fluid. 02/05/23   Antonio Cyndee [name], DO  ipratropium-albuterol  (DUONEB) 0.5-2.5 (3) MG/3ML SOLN Take 3 mLs by nebulization every 6 (six) hours as needed. 09/03/23   Lowne Chase, Yvonne R, DO  levalbuterol  (XOPENEX ) 1.25 MG/3ML nebulizer solution Take 1.25 mg by nebulization every 6 (six) hours as needed for wheezing or shortness of breath. 09/03/23   Lowne Chase, Yvonne R, DO  levothyroxine  (SYNTHROID ) 112 MCG tablet Take 1 tablet (112 mcg total) by mouth daily. 09/10/23   Antonio Cyndee [name], DO  losartan  (COZAAR ) 50 MG tablet Take 50 mg by mouth daily.    [provider]  magnesium  oxide (MAG-OX) 400 MG tablet Take 1 tablet (400 mg total) by mouth daily. 06/23/23   Duke, Jon Garre, PA  Multiple Vitamin (MULTIVITAMIN WITH MINERALS) TABS tablet Take 1 tablet  by mouth once a week.    [provider]  mupirocin ointment (BACTROBAN) 2 % Apply 1 Application topically 2 (two) times daily. 08/09/23   [provider]  OFEV  100 MG CAPS Take 100 mg by mouth 2 (two) times daily. 06/19/22   [provider]  oxyCODONE -acetaminophen  (PERCOCET/ROXICET) 5-325 MG tablet Take 1 tablet by mouth every 6 (six) hours as needed for severe pain (pain score 7-10). 09/27/23   Roselyn Carlin NOVAK, MD  potassium chloride  (KLOR-CON  M) 20 MEQ tablet Take 1 tablet (20 mEq total) by mouth daily for 3 days. 09/14/23 09/17/23  Craig Alan SAUNDERS, PA-C  promethazine -dextromethorphan (PROMETHAZINE -DM) 6.25-15 MG/5ML syrup Take 5 mLs by mouth 4 (four) times daily as needed for cough. 08/07/23   Antonio Cyndee [name], DO  Respiratory Therapy Supplies (FULL KIT NEBULIZER SET) MISC Use with DUONEB for COPD treatment 09/03/23   Lowne Chase, Yvonne R, DO  vitamin C  (ASCORBIC ACID ) 500 MG tablet Take 500 mg by mouth 2 (two) times a week.    [provider]    Allergies: Prednisone , Dilaudid  [hydromorphone ], Ipratropium bromide , Atrovent  hfa [ipratropium bromide  hfa], Cheratussin ac [guaifenesin -codeine ], Hydrocodone, Influenza vaccines, Morphine , Oxycodone , Codeine , Diltiazem , and Ibuprofen     Review of Systems  Gastrointestinal:  Positive for abdominal pain and diarrhea.    Updated Vital Signs BP (!) 188/79 (BP Location: Left Arm)   Pulse 75   Temp 97.6 F (36.4 C)   Resp 18   Ht 5' 4 (1.626 m)   SpO2 93%   BMI 21.80 kg/m   Physical Exam Vitals and nursing note reviewed.  Constitutional:      General: She is not in acute distress.    Appearance: She is not toxic-appearing.  HENT:     Head: Normocephalic.   Cardiovascular:     Rate and Rhythm: Normal rate and regular rhythm.  Pulmonary:     Effort: Pulmonary effort is normal.     Breath sounds: Normal breath sounds.  Abdominal:     General: Abdomen is flat.     Palpations: Abdomen is soft.     Tenderness: There is generalized abdominal tenderness. There is no guarding or rebound.   Skin:    General: Skin is warm and dry.     Capillary Refill: Capillary refill takes less than 2 seconds.   Neurological:     General: No focal deficit present.     Mental Status: She is alert.   Psychiatric:        Mood and Affect: Mood normal.        Behavior: Behavior normal.     (all labs ordered are listed, but only abnormal results are displayed) Labs Reviewed  COMPREHENSIVE METABOLIC PANEL WITH GFR - Abnormal; Notable for the following components:      Result  Value   Glucose, Bld 102 (*)    All other components within normal limits  LIPASE, BLOOD  CBC  MAGNESIUM   URINALYSIS, ROUTINE W REFLEX MICROSCOPIC  I-STAT CG4 LACTIC ACID, ED  I-STAT CG4 LACTIC ACID, ED    EKG: None  Radiology: CT ABDOMEN PELVIS W CONTRAST Result Date: 09/30/2023 EXAM:  CT ABDOMEN PELVIS WITH IV CONTRAST INDICATION:  LLQ abdominal pain TECHNIQUE: Spiral CT scanning was performed through the abdomen and pelvis after the patient received IV contrast. COMPARISON: 09/26/2023 FINDINGS: Changes of advanced pulmonary fibrosis are present in the lung bases, unchanged. There is no significant abnormality identified in the liver, spleen, pancreas and gallbladder. No calculus,  obstruction, or soft tissue mass is present involving the kidneys. There are no adrenal masses. The abdominal bowel loops are unremarkable. There is no evidence of ascites or adenopathy. No abdominal aortic aneurysm is present. The uterus is surgically absent. The bladder has a normal appearance. There is no adnexal mass. There is no inguinal hernia. Postsurgical changes are present in the pelvic bowel loops and multiple locations. No bowel wall thickening or bowel dilatation is present. There is no inflammatory process identified involving the bowel loops. There is no fracture or bone destruction. IMPRESSION: Stable examination. No evidence of acute process. Please note that CT scanning at this site utilizes multiple dose reduction techniques, including automatic exposure control, adjustment of the MAA and/or KVP according to the patient's size, and use of iterative reconstruction. Electronically signed by: Eddy Oar MD 09/30/2023 02:55 PM EDT RP Workstation: [id number]     Procedures   Medications Ordered in the ED  ondansetron  (ZOFRAN ) injection 4 mg (4 mg Intravenous Given 09/30/23 1330)  lactated ringers  bolus 1,000 mL (0 mLs Intravenous Stopped 09/30/23 1649)  fentaNYL  (SUBLIMAZE ) injection 25 mcg (25 mcg  Intravenous Given 09/30/23 1329)  iohexol  (OMNIPAQUE ) 300 MG/ML solution 100 mL (100 mLs Intravenous Contrast Given 09/30/23 1436)  alum & mag hydroxide-simeth (MAALOX/MYLANTA) 200-200-20 MG/5ML suspension 30 mL (30 mLs Oral Given 09/30/23 1613)  dicyclomine  (BENTYL ) 10 MG/5ML solution 10 mg (10 mg Oral Given 09/30/23 1613)    Clinical Course as of 09/30/23 1723  Thu Sep 30, 2023  1504 CT ABDOMEN PELVIS W CONTRAST EXAM:  CT ABDOMEN PELVIS WITH IV CONTRAST  INDICATION:  LLQ abdominal pain  TECHNIQUE: Spiral CT scanning was performed through the abdomen and pelvis after the patient received IV contrast.  COMPARISON: 09/26/2023  FINDINGS: Changes of advanced pulmonary fibrosis are present in the lung bases, unchanged. There is no significant abnormality identified in the liver, spleen, pancreas and gallbladder. No calculus, obstruction, or soft tissue mass is present involving the kidneys. There are no adrenal masses. The abdominal bowel loops are unremarkable. There is no evidence of ascites or adenopathy. No abdominal aortic aneurysm is present.  The uterus is surgically absent. The bladder has a normal appearance. There is no adnexal mass. There is no inguinal hernia. Postsurgical changes are present in the pelvic bowel loops and multiple locations. No bowel wall thickening or bowel dilatation is present. There is no inflammatory process identified involving the bowel loops.  There is no fracture or bone destruction.  IMPRESSION: Stable examination. No evidence of acute process.   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 Sheldon 08/13/2022 colostomy for colon resection and ostomy takedown with Dr. 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 SCAD with previous colon concerning for colitis, consider repeat flex sig/colonoscopy in the furture, nopt needed at this time  [TY]  1622 CBC No leukocytosis to suggest infectious process.  No anemia [TY]  1622 Magnesium : 2.1 [TY]  1622 Lipase: 23 Acute pancreatitis unlikely [TY]  1622 Comprehensive metabolic panel(!) No metabolic derangements.  Normal kidney function.  No transaminitis to suggest hepatobiliary disease. [TY]  1622 Lactic Acid, Venous: 1.5 Systemic infection unlikely.  Mesenteric ischemia/bowel ischemia unlikely [TY]  1623 Patient given IV fluids, fentanyl , Zofran  and Bentyl  and GI cocktail.  Still having some symptoms, but mildly improved. [TY]    Clinical Course User  Index [TY] Neysa Caron PARAS, DO                                 Medical Decision Making This is a 78 year old female presenting emergency department for generalized abdominal pain.  She is afebrile nontachycardic, slightly hypertensive.  Physical exam with soft abdomen with some mild generalized tenderness.  Per chart review has had colostomy status post reversal; see ED course for most recent GI note.  Symptoms seemingly subacute/chronic at this point.  Will get basic labs and CT scan.  Given IV fentanyl  for pain control as she does appear comfortable.  Plan to reevaluate.  Amount and/or Complexity of Data Reviewed Independent Historian:     Details: Spouse notes that symptoms been ongoing for the past 4 months and plan for EGD with GI External Data Reviewed:     Details: See ED course Labs: ordered. Decision-making details documented in ED Course. Radiology: ordered. Decision-making details documented in ED Course.  Risk OTC drugs. Prescription drug management. Decision regarding hospitalization. Diagnosis or treatment significantly limited by social determinants of health. Risk Details: Poor health literacy      Final diagnoses:  Abdominal pain, unspecified abdominal location    ED Discharge Orders          Ordered    dicyclomine  (BENTYL ) 20 MG tablet  2 times daily        09/30/23 1643    ondansetron  (ZOFRAN -ODT) 4 MG disintegrating tablet  Every 8 hours PRN        09/30/23 1643               Asha Grumbine, Caron PARAS, DO 09/30/23 1723

## 2023-09-30 NOTE — Discharge Instructions (Signed)
 Please follow-up with your gastroenterologist as soon as possible.  Return immediately develop fevers, chills, chest pain, shortness of breath, lightheadedness, passout, or any new or worsening symptoms that are concerning to you.  We are prescribing you some symptomatic medications that she can take until you are able to be seen by her stomach doctor.

## 2023-10-01 ENCOUNTER — Ambulatory Visit: Payer: Self-pay

## 2023-10-01 NOTE — Telephone Encounter (Signed)
 FYI Only or Action Required?: Action required by provider: clinical question for provider and update on patient condition.  Patient was last seen in primary care on 09/28/2023 by Antonio Meth, [name], DO. Called Nurse Triage reporting Abdominal Pain. Symptoms began several months ago. Interventions attempted: Rest, hydration, or home remedies. Symptoms are: gradually worsening.  Triage Disposition: See HCP Within 4 Hours (Or PCP Triage)  Patient/caregiver understands and will follow disposition?: yes, but greater than recommended time frame  Copied from CRM [id number]. Topic: Clinical - Medical Advice >> Oct 01, 2023  9:16 AM Tiffany S wrote: Reason for CRM: Patient is having abdomen pain stated she to ER yesterday and they didn't find any concerns patient is requesting call back she says she dosen't know what else to do hospital f/u is scheduled with PCP Reason for Disposition  [1] MILD-MODERATE pain AND [2] constant AND [3] present > 2 hours  Answer Assessment - Initial Assessment Questions 1. LOCATION: Where does it hurt?      Abdomen lower left  2. RADIATION: Does the pain shoot anywhere else? (e.g., chest, back)     To ribs right sided  3. ONSET: When did the pain begin? (e.g., minutes, hours or days ago)      Couple months 4. SUDDEN: Gradual or sudden onset?     gradual 5. PATTERN Does the pain come and go, or is it constant?    - If it comes and goes: How long does it last? Do you have pain now?     (Note: Comes and goes means the pain is intermittent. It goes away completely between bouts.)    - If constant: Is it getting better, staying the same, or getting worse?      (Note: Constant means the pain never goes away completely; most serious pain is constant and gets worse.)      Constant  6. SEVERITY: How bad is the pain?  (e.g., Scale 1-10; mild, moderate, or severe)    - MILD (1-3): Doesn't interfere with normal activities, abdomen soft and not tender to touch.      - MODERATE (4-7): Interferes with normal activities or awakens from sleep, abdomen tender to touch.     - SEVERE (8-10): Excruciating pain, doubled over, unable to do any normal activities.       Moderate-severe 7. RECURRENT SYMPTOM: Have you ever had this type of stomach pain before? If Yes, ask: When was the last time? and What happened that time?      ongoing 8. CAUSE: What do you think is causing the stomach pain?     unsure      9. OTHER SYMPTOMS: Do you have any other symptoms? (e.g., back pain, diarrhea, fever, urination pain, vomiting)       Diarrhea  Additional info:  1) ED rx Bentyl  and Zofran , she has not started these yet.  2) GI Endo July 8th. She would like this appointment to be moved sooner. She understands changing practice would mean starting over and that is not what she wants, she wants to know if this GI has another office she can have an appointment sooner than July 8th, she states it is ridiculous to wait that long. Would like PCP input. 3) Hospital follow up was scheduled for July 3.  Protocols used: Abdominal Pain - Female-A-AH

## 2023-10-01 NOTE — Telephone Encounter (Signed)
Pt seen in the ED yesterday

## 2023-10-03 ENCOUNTER — Encounter: Payer: Self-pay | Admitting: Internal Medicine

## 2023-10-06 DIAGNOSIS — M950 Acquired deformity of nose: Secondary | ICD-10-CM | POA: Diagnosis not present

## 2023-10-07 ENCOUNTER — Telehealth (INDEPENDENT_AMBULATORY_CARE_PROVIDER_SITE_OTHER): Admitting: Family Medicine

## 2023-10-07 ENCOUNTER — Ambulatory Visit: Payer: Self-pay

## 2023-10-07 DIAGNOSIS — J0141 Acute recurrent pansinusitis: Secondary | ICD-10-CM

## 2023-10-07 DIAGNOSIS — J84112 Idiopathic pulmonary fibrosis: Secondary | ICD-10-CM

## 2023-10-07 DIAGNOSIS — E039 Hypothyroidism, unspecified: Secondary | ICD-10-CM

## 2023-10-07 DIAGNOSIS — R197 Diarrhea, unspecified: Secondary | ICD-10-CM | POA: Diagnosis not present

## 2023-10-07 MED ORDER — COLESTIPOL HCL 1 G PO TABS: 1.0000 g | ORAL_TABLET | Freq: Every day | ORAL | 0 refills | Status: DC

## 2023-10-07 NOTE — Telephone Encounter (Signed)
 FYI Only or Action Required?: FYI only for provider.  Patient was last seen in primary care on 10/07/2023 by Antonio Meth, [name], DO. Called Nurse Triage reporting Dizziness, heart racing, arms feel heavy. Symptoms began unsure. Interventions attempted: Nothing. Symptoms are: unchanged.  Pt had vv this morning and forgot to mention these s/s to provider. Pt thinks this is a heart issue. Pt ended call quickly in order to call Cardiologist office. Pt will go to Ed if needed.  Triage Disposition: Go to ED Now (Notify PCP)  Patient/caregiver understands and will follow disposition?: Yes                  Copied from CRM [id number]. Topic: Clinical - Red Word Triage >> Oct 07, 2023 11:24 AM Alexandria [name] wrote: Red Word that prompted transfer to Nurse Triage: Patient had virtual appt this morning, forgot to mention that this morning when she stands up she feels light headed, arms feel heavy and her chest is pounding Reason for Disposition  Dizziness, lightheadedness, or weakness  Answer Assessment - Initial Assessment Questions 1. DESCRIPTION: Please describe your heart rate or heartbeat that you are having (e.g., fast/slow, regular/irregular, skipped or extra beats, palpitations)     Feels like her heart is racing 2. ONSET: When did it start? (Minutes, hours or days)      unsure 3. DURATION: How long does it last (e.g., seconds, minutes, hours)     unsure 8. CAUSE: What do you think is causing the palpitations?     Heart issue 9. CARDIAC HISTORY: Do you have any history of heart disease? (e.g., heart attack, angina, bypass surgery, angioplasty, arrhythmia)      MI 10. OTHER SYMPTOMS: Do you have any other symptoms? (e.g., dizziness, chest pain, sweating, difficulty breathing)       Lightheaded, arms feel heavy  Protocols used: Heart Rate and Heartbeat Questions-A-AH

## 2023-10-07 NOTE — Progress Notes (Signed)
 MyChart Video Visit    Virtual Visit via Video Note   This patient is at least at moderate risk for complications without adequate follow up. This format is felt to be most appropriate for this patient at this time. Physical exam was limited by quality of the video and audio technology used for the visit. heather was able to get the patient set up on a video visit.  Patient location: home  Patient and provider in visit Provider location: Office  I discussed the limitations of evaluation and management by telemedicine and the availability of in person appointments. The patient expressed understanding and agreed to proceed.  Visit Date: 10/07/2023  Today's healthcare provider: [name] Antonio Cyndee, DO     Subjective:    Patient ID: Alexandria [name], female    DOB: [birth year], 78 y.o.   MRN: [id number]  Chief Complaint  Patient presents with   ED follow up    HPI Patient is in today for f/u from ed.  Discussed the use of AI scribe software for clinical note transcription with the patient, who gave verbal consent to proceed.  History of Present Illness Alexandria [name] is a 78 year old female with idiopathic pulmonary fibrosis who presents with difficulty breathing and nasal congestion.  She has been experiencing difficulty breathing and nasal congestion, with nasal discharge described as 'a lot of green gump.' These symptoms began after a previous unspecified procedure and have persisted despite a ten-day course of antibiotics. She is using mupirocin ointment intranasally three times daily due to significant congestion that impairs her breathing. She has consulted an ENT specialist regarding her nasal congestion.  She reports gastrointestinal issues, including frequent bowel movements occurring two to three times a day, despite low food intake. She is taking colestipol , which she describes as a large pill, and requires a reorder. She experiences dizziness upon  standing and has a lack of appetite, affecting her ability to eat.  She has a history of idiopathic pulmonary fibrosis and has previously consulted the [facility] for this condition. She is concerned about her current health trajectory, noting that her symptoms impact her daily life and ability to maintain her home.    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 the left ventricular wall motion is normal   Hypothyroidism    IPF (idiopathic pulmonary fibrosis) (HCC) 01/2018   Myocardial infarction (HCC)    hx of 30 years ago   Pancreatitis    TIA (transient ischemic attack)    8 years ago    Past Surgical History:  Procedure Laterality Date   BREAST BIOPSY Left    [facility]   CARDIAC CATHETERIZATION     10/2013   CATARACT EXTRACTION Bilateral 03/22/2018   FLEXIBLE SIGMOIDOSCOPY N/A 08/13/2022   Procedure: FLEXIBLE SIGMOIDOSCOPY;  Surgeon: Sheldon Standing, MD;  Location: [facility];  Service: General;  Laterality: N/A;   ILEOSTOMY CLOSURE N/A 11/19/2022   Procedure: OPEN TAKEDOWN OF LOOP ILEOSTOMY;  Surgeon: Sheldon Standing, MD;  Location: [facility];  Service:  General;  Laterality: N/A;   LEFT HEART CATHETERIZATION WITH CORONARY ANGIOGRAM N/A 10/25/2013   Procedure: LEFT HEART CATHETERIZATION WITH CORONARY ANGIOGRAM;  Surgeon: Debby DELENA Sor, MD;  Location: [facility] CATH LAB;  Service: Cardiovascular;  Laterality: N/A;   LYSIS OF ADHESION N/A 08/13/2022   Procedure: LYSIS OF ADHESION, DIVERTING ILEOSTOMY;  Surgeon: Sheldon Standing, MD;  Location: [facility];  Service: General;  Laterality: N/A;   NECK SURGERY     ACDF x 2       done in [place] more than 25 years, doesn't know levels   RHINOPLASTY  08/16/2023   uses [place] in her nostril   TOTAL KNEE ARTHROPLASTY     Bilateral x's 2   VAGINAL HYSTERECTOMY  10/17/1998   [name]   VIDEO BRONCHOSCOPY Bilateral 01/24/2018   Procedure: VIDEO BRONCHOSCOPY WITH FLUORO;  Surgeon: Alaine Vicenta NOVAK, MD;  Location: [facility] ENDOSCOPY;  Service: Cardiopulmonary;  Laterality: Bilateral;   XI ROBOTIC ASSISTED COLOSTOMY TAKEDOWN N/A 08/13/2022   Procedure: ROBOTIC OSTOMY TAKEDOWN, SMALL BOWEL RESECTION X 2, BILATERAL TAP BLOCK, TISSUE PERFUSSION ASSESMENT VIA FIREFLY, MOBILAZATION OF SPLENIC FLEXURE;  Surgeon: Sheldon Standing, MD;  Location: [facility];  Service: General;  Laterality: N/A;   XI ROBOTIC ASSISTED LOWER ANTERIOR RESECTION N/A 03/31/2022   Procedure: XI ROBOTIC ASSISTED LOWER ANTERIOR RESECTION;  Surgeon: Sheldon Standing, MD;  Location: [facility];  Service: General;  Laterality: N/A;    Family History  Problem Relation Age of Onset   Ovarian cancer Mother    Uterine cancer Mother    Lung cancer Father    HIV Brother        90   Kidney cancer Brother    Lung cancer Brother    Other Brother        Mouth Cancer   Colon cancer Maternal Aunt    Heart disease Maternal Grandmother    Stroke Maternal Grandmother    Hypertension Maternal Grandmother    Diabetes Maternal Grandmother    Arthritis Other    Esophageal cancer Neg Hx    Liver disease Neg Hx    Rectal cancer Neg Hx    Stomach cancer Neg Hx     Social History   Socioeconomic History   Marital status: Married    Spouse name: [name]   Number of children: 3   Years of education: Jr college   Highest education level: Not on file  Occupational History   Occupation: Leisure centre manager: UNEMPLOYED   Occupation: retired  Tobacco Use   Smoking status: Never   Smokeless tobacco: Never  Vaping Use   Vaping status: Never Used  Substance and Sexual Activity   Alcohol  use: No    Alcohol /week: 0.0 standard  drinks of alcohol    Drug use: No   Sexual activity: Not Currently  Other Topics Concern   Not on file  Social History Narrative   Lives with husband [name]   Caffeine use: coffee (2 cups per day)   Mostly right-handed   Social Drivers of Corporate investment banker Strain: Not on file  Food Insecurity: No Food Insecurity (08/31/2023)   Hunger Vital Sign    Worried About Running Out of Food in the Last Year: Never true    Ran Out of Food in the Last Year: Never true  Transportation Needs: No Transportation Needs (08/31/2023)   PRAPARE - Administrator, Civil Service (Medical): No    Lack of Transportation (Non-Medical): No  Physical Activity:  Not on file  Stress: Not on file  Social Connections: Unknown (08/25/2023)   Social Connection and Isolation Panel    Frequency of Communication with Friends and Family: Patient declined    Frequency of Social Gatherings with Friends and Family: Patient declined    Attends Religious Services: Patient declined    Database administrator or Organizations: Patient declined    Attends Banker Meetings: Patient declined    Marital Status: Married  Catering manager Violence: Not At Risk (08/31/2023)   Humiliation, Afraid, Rape, and Kick questionnaire    Fear of Current or Ex-Partner: No    Emotionally Abused: No    Physically Abused: No    Sexually Abused: No    Outpatient Medications Prior to Visit  Medication Sig Dispense Refill   albuterol  (VENTOLIN  HFA) 108 (90 Base) MCG/ACT inhaler Inhale 2 puffs into the lungs every 6 (six) hours as needed (coughing). 8 g 1   ALPRAZolam  (XANAX ) 0.5 MG tablet Take 1 tablet (0.5 mg total) by mouth 2 (two) times daily as needed for anxiety. 10 tablet 0   amLODipine  (NORVASC ) 5 MG tablet Take 1 tablet (5 mg total) by mouth daily. 30 tablet 2   amoxicillin -clavulanate (AUGMENTIN ) 875-125 MG tablet Take 1 tablet by mouth 2 (two) times daily. 20 tablet 0   aspirin  EC 81 MG tablet Take 81 mg  by mouth 3 (three) times a week. Swallow whole.     benzonatate  (TESSALON ) 200 MG capsule Take 1 capsule (200 mg total) by mouth 3 (three) times daily as needed for cough. 20 capsule 3   dicyclomine  (BENTYL ) 20 MG tablet Take 1 tablet (20 mg total) by mouth 2 (two) times daily. 20 tablet 0   diphenoxylate -atropine  (LOMOTIL ) 2.5-0.025 MG tablet Take 2 tablets by mouth 3 (three) times daily as needed for diarrhea or loose stools.     folic acid  (FOLVITE ) 400 MCG tablet Take 1 tablet by mouth every other day.     furosemide  (LASIX ) 20 MG tablet Take 1 tablet (20 mg total) by mouth daily. (Patient taking differently: Take 10 mg by mouth daily as needed for edema or fluid.) 30 tablet 3   ipratropium-albuterol  (DUONEB) 0.5-2.5 (3) MG/3ML SOLN Take 3 mLs by nebulization every 6 (six) hours as needed. 360 mL 1   levalbuterol  (XOPENEX ) 1.25 MG/3ML nebulizer solution Take 1.25 mg by nebulization every 6 (six) hours as needed for wheezing or shortness of breath. 150 mL 1   levothyroxine  (SYNTHROID ) 112 MCG tablet Take 1 tablet (112 mcg total) by mouth daily. 30 tablet 2   losartan  (COZAAR ) 50 MG tablet Take 50 mg by mouth daily.     magnesium  oxide (MAG-OX) 400 MG tablet Take 1 tablet (400 mg total) by mouth daily. 90 tablet 3   Multiple Vitamin (MULTIVITAMIN WITH MINERALS) TABS tablet Take 1 tablet by mouth once a week.     mupirocin ointment (BACTROBAN) 2 % Apply 1 Application topically 2 (two) times daily.     OFEV  100 MG CAPS Take 100 mg by mouth 2 (two) times daily.     ondansetron  (ZOFRAN -ODT) 4 MG disintegrating tablet Take 1 tablet (4 mg total) by mouth every 8 (eight) hours as needed for nausea or vomiting. 20 tablet 0   oxyCODONE -acetaminophen  (PERCOCET/ROXICET) 5-325 MG tablet Take 1 tablet by mouth every 6 (six) hours as needed for severe pain (pain score 7-10). 15 tablet 0   potassium chloride  (KLOR-CON  M) 20 MEQ tablet Take 1 tablet (20 mEq  total) by mouth daily for 3 days. 3 tablet 0    promethazine -dextromethorphan (PROMETHAZINE -DM) 6.25-15 MG/5ML syrup Take 5 mLs by mouth 4 (four) times daily as needed for cough. 118 mL 0   Respiratory Therapy Supplies (FULL KIT NEBULIZER SET) MISC Use with DUONEB for COPD treatment 1 each 0   vitamin C  (ASCORBIC ACID ) 500 MG tablet Take 500 mg by mouth 2 (two) times a week.     colestipol  (COLESTID ) 1 g tablet Take 1 tablet (1 g total) by mouth daily. 30 tablet 0   No facility-administered medications prior to visit.    Allergies  Allergen Reactions   Prednisone  Other (See Comments)    Was prescribed high doses at other times   Dilaudid  [Hydromorphone ] Itching and Other (See Comments)    Dilaudid  caused marked confusion   Ipratropium Bromide  Itching and Other (See Comments)    Mental Status Changes, Insomnia  Insomnia - Atrovent .   Atrovent  Hfa [Ipratropium Bromide  Hfa] Itching and Other (See Comments)    Pt could not sleep, Insomnia    Cheratussin Ac [Guaifenesin -Codeine ] Other (See Comments)    Headaches    Hydrocodone Itching   Influenza Vaccines Other (See Comments)    Pt reports heart attack after last flu shot   Morphine  Itching   Oxycodone  Itching   Codeine  Itching, Nausea Only and Other (See Comments)    Pt takes promethazine  with codeine  at home   Diltiazem  Itching and Other (See Comments)    Makes patient sick. Shakes. GI   Ibuprofen  Itching and Other (See Comments)    Gives false reading in blood    Review of Systems  Constitutional:  Negative for fever and malaise/fatigue.  HENT:  Negative for congestion.   Eyes:  Negative for blurred vision.  Respiratory:  Negative for cough and shortness of breath.   Cardiovascular:  Negative for chest pain, palpitations and leg swelling.  Gastrointestinal:  Negative for abdominal pain, blood in stool, nausea and vomiting.  Genitourinary:  Negative for dysuria and frequency.  Musculoskeletal:  Negative for back pain and falls.  Skin:  Negative for rash.   Neurological:  Negative for dizziness, loss of consciousness and headaches.  Endo/Heme/Allergies:  Negative for environmental allergies.  Psychiatric/Behavioral:  Negative for depression. The patient is not nervous/anxious.        Objective:    Physical Exam Vitals and nursing note reviewed.  Constitutional:      Appearance: Normal appearance. She is well-developed. She is ill-appearing.  Pulmonary:     Effort: Pulmonary effort is normal. No respiratory distress.  Neurological:     Mental Status: She is alert and oriented to person, place, and time.  Psychiatric:        Mood and Affect: Mood normal.        Behavior: Behavior normal.        Thought Content: Thought content normal.        Judgment: Judgment normal.     There were no vitals taken for this visit. Wt Readings from Last 3 Encounters:  09/26/23 127 lb (57.6 kg)  09/17/23 134 lb (60.8 kg)  09/13/23 133 lb 6 oz (60.5 kg)       Assessment & Plan:  Diarrhea, unspecified type -     Colestipol  HCl; Take 1 tablet (1 g total) by mouth daily.  Dispense: 90 tablet; Refill: 0  IPF (idiopathic pulmonary fibrosis) (HCC)  Hypothyroidism, unspecified type  Acute recurrent pansinusitis   Assessment and Plan Assessment & Plan Chronic  Sinusitis   She experiences persistent nasal congestion with green discharge following a procedure. A ten-day antibiotic course showed no improvement. An ENT specialist ordered a CT scan for further evaluation. Mupirocin ointment is prescribed for intranasal use to address caked nasal secretions. Due to travel inconvenience, a referral to [facility] is considered. Perform a CT scan of the sinuses, use mupirocin ointment intranasally three times daily, and continue antibiotics as prescribed. Consider referral to [facility] for further evaluation if symptoms persist. Contact [facility] to schedule an appointment and send medical records from [place].  Idiopathic Pulmonary Fibrosis (IPF)   She has  IPF with previous evaluation at [facility]. There is concern about prognosis and the impact of symptoms on her quality of life. [facility] advised that remaining active could extend life expectancy to 10-15 years, but her current inactivity and lack of appetite may reduce this prognosis. Consider re-evaluation at [facility] for IPF management and ensure all relevant medical records are sent to [facility] for review.  Gastrointestinal Symptoms   She reports frequent bowel movements, dizziness upon standing, and lack of appetite. An abdominal CT scan was normal, and an endoscopy is scheduled to investigate symptoms. She is currently taking colestipol , which may contribute to symptoms. A referral to a tertiary care center is considered if symptoms persist post-endoscopy. Proceed with the scheduled endoscopy on July 8, reorder colestipol  as needed, and consider referral to a tertiary care center if symptoms persist after endoscopy.    I discussed the assessment and treatment plan with the patient. The patient was provided an opportunity to ask questions and all were answered. The patient agreed with the plan and demonstrated an understanding of the instructions.   The patient was advised to call back or seek an in-person evaluation if the symptoms worsen or if the condition fails to improve as anticipated.  [name] Antonio Cyndee, DO [facility] [phone] (phone) [phone] (fax)  [facility]

## 2023-10-12 ENCOUNTER — Ambulatory Visit: Admitting: Internal Medicine

## 2023-10-12 ENCOUNTER — Encounter: Payer: Self-pay | Admitting: Internal Medicine

## 2023-10-12 VITALS — BP 123/84 | HR 74 | Temp 97.9°F | Resp 24 | Ht 64.0 in | Wt 133.0 lb

## 2023-10-12 DIAGNOSIS — K921 Melena: Secondary | ICD-10-CM | POA: Diagnosis not present

## 2023-10-12 DIAGNOSIS — K2970 Gastritis, unspecified, without bleeding: Secondary | ICD-10-CM | POA: Diagnosis not present

## 2023-10-12 DIAGNOSIS — I252 Old myocardial infarction: Secondary | ICD-10-CM | POA: Diagnosis not present

## 2023-10-12 DIAGNOSIS — E039 Hypothyroidism, unspecified: Secondary | ICD-10-CM | POA: Diagnosis not present

## 2023-10-12 MED ORDER — SODIUM CHLORIDE 0.9 % IV SOLN: 500.0000 mL | INTRAVENOUS | Status: DC

## 2023-10-12 MED ORDER — PANTOPRAZOLE SODIUM 40 MG PO TBEC: 40.0000 mg | DELAYED_RELEASE_TABLET | Freq: Every day | ORAL | 3 refills | Status: DC

## 2023-10-12 NOTE — Progress Notes (Signed)
 Called to room to assist during endoscopic procedure.  Patient ID and intended procedure confirmed with present staff. Received instructions for my participation in the procedure from the performing physician.

## 2023-10-12 NOTE — Progress Notes (Signed)
 Report given to PACU, vss

## 2023-10-12 NOTE — Progress Notes (Signed)
 Pt coughing during recovery, sats wnl, pt did not bring her inhaler with her, states she doesn't need it, denies difficulty breathing just states coughing takes her breath away, encourage pt to take her neb tx at home if coughing continues and she has any difficulty breathing at home, verb understanding. VSS,

## 2023-10-12 NOTE — Progress Notes (Signed)
 GASTROENTEROLOGY PROCEDURE H&P NOTE   Primary Care Physician: Antonio Cyndee [name], DO    Reason for Procedure:   Melena, LUQ ab pain, diarrhea  Plan:    EGD  Patient is appropriate for endoscopic procedure(s) in the ambulatory ([facility]) setting.  The nature of the procedure, as well as the risks, benefits, and alternatives were carefully and thoroughly reviewed with the patient. Ample time for discussion and questions allowed. The patient understood, was satisfied, and agreed to proceed.     HPI: Alexandria [name] is a 78 y.o. female who presents for EGD for evaluation of melena, LUQ ab pain, diarrhea.  Patient was most recently seen in the Gastroenterology Clinic on 09/13/23.  No interval change in medical history since that appointment. She was told by Dr. Sheldon that her current symptoms are not his issue. Patient did not respond to Flagyl  therapy. Has not fully responded to colestipol  therapy either. Please refer to that note for full details regarding GI history and clinical presentation.   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 the left ventricular wall motion is normal   Hypothyroidism    IPF (idiopathic pulmonary fibrosis) (HCC) 01/2018   Myocardial infarction (HCC)    hx of 30 years ago   Pancreatitis    TIA (transient ischemic attack)    8 years ago    Past Surgical History:  Procedure Laterality Date   BREAST BIOPSY Left    [facility]   CARDIAC CATHETERIZATION     10/2013   CATARACT EXTRACTION Bilateral  03/22/2018   FLEXIBLE SIGMOIDOSCOPY N/A 08/13/2022   Procedure: FLEXIBLE SIGMOIDOSCOPY;  Surgeon: Sheldon Standing, MD;  Location: [facility];  Service: General;  Laterality: N/A;   ILEOSTOMY CLOSURE N/A 11/19/2022   Procedure: OPEN TAKEDOWN OF LOOP ILEOSTOMY;  Surgeon: Sheldon Standing, MD;  Location: [facility];  Service: General;  Laterality: N/A;   LEFT HEART CATHETERIZATION WITH CORONARY ANGIOGRAM N/A 10/25/2013   Procedure: LEFT HEART CATHETERIZATION WITH CORONARY ANGIOGRAM;  Surgeon: Debby DELENA Sor, MD;  Location: [facility] CATH LAB;  Service: Cardiovascular;  Laterality: N/A;   LYSIS OF ADHESION N/A 08/13/2022   Procedure: LYSIS OF ADHESION, DIVERTING ILEOSTOMY;  Surgeon: Sheldon Standing, MD;  Location: [facility];  Service: General;  Laterality: N/A;   NECK SURGERY     ACDF x 2      done in [place] more than 25 years, doesn't know levels   RHINOPLASTY  08/16/2023   uses [place] in her nostril   TOTAL KNEE ARTHROPLASTY     Bilateral x's 2   VAGINAL HYSTERECTOMY  10/17/1998   [name]   VIDEO BRONCHOSCOPY Bilateral 01/24/2018   Procedure: VIDEO BRONCHOSCOPY WITH FLUORO;  Surgeon: Alaine Vicenta NOVAK, MD;  Location: [facility];  Service: Cardiopulmonary;  Laterality: Bilateral;   XI ROBOTIC ASSISTED COLOSTOMY TAKEDOWN N/A 08/13/2022   Procedure: ROBOTIC OSTOMY TAKEDOWN, SMALL BOWEL RESECTION X 2, BILATERAL TAP BLOCK, TISSUE PERFUSSION ASSESMENT VIA FIREFLY,  MOBILAZATION OF SPLENIC FLEXURE;  Surgeon: Sheldon Standing, MD;  Location: [facility];  Service: General;  Laterality: N/A;   XI ROBOTIC ASSISTED LOWER ANTERIOR RESECTION N/A 03/31/2022   Procedure: XI ROBOTIC ASSISTED LOWER ANTERIOR RESECTION;  Surgeon: Sheldon Standing, MD;  Location: [facility];  Service: General;  Laterality: N/A;    Prior to Admission medications   Medication Sig Start Date End Date Taking? Authorizing Provider  colestipol  (COLESTID ) 1 g tablet Take 1 tablet (1 g total) by mouth daily. 10/07/23 01/05/24 Yes Antonio Cyndee [name], DO   diphenoxylate -atropine  (LOMOTIL ) 2.5-0.025 MG tablet Take 2 tablets by mouth 3 (three) times daily as needed for diarrhea or loose stools. 07/07/23  Yes [provider]  levothyroxine  (SYNTHROID ) 112 MCG tablet Take 1 tablet (112 mcg total) by mouth daily. 09/10/23  Yes Antonio Cyndee Jamee R, DO  potassium chloride  (KLOR-CON  M) 20 MEQ tablet Take 1 tablet (20 mEq total) by mouth daily for 3 days. 09/14/23 10/12/23 Yes Craig Alan JONELLE, PA-C  vitamin C  (ASCORBIC ACID ) 500 MG tablet Take 500 mg by mouth 2 (two) times a week.   Yes [provider]  albuterol  (VENTOLIN  HFA) 108 (90 Base) MCG/ACT inhaler Inhale 2 puffs into the lungs every 6 (six) hours as needed (coughing). 08/05/23   Cyndi Shaver, PA-C  ALPRAZolam  (XANAX ) 0.5 MG tablet Take 1 tablet (0.5 mg total) by mouth 2 (two) times daily as needed for anxiety. 08/28/23   Drusilla Sabas RAMAN, MD  amLODipine  (NORVASC ) 5 MG tablet Take 1 tablet (5 mg total) by mouth daily. 08/29/23   Drusilla Sabas RAMAN, MD  amoxicillin -clavulanate (AUGMENTIN ) 875-125 MG tablet Take 1 tablet by mouth 2 (two) times daily. 09/28/23   Antonio Cyndee [name], DO  aspirin  EC 81 MG tablet Take 81 mg by mouth 3 (three) times a week. Swallow whole.    [provider]  benzonatate  (TESSALON ) 200 MG capsule Take 1 capsule (200 mg total) by mouth 3 (three) times daily as needed for cough. 08/28/23   Drusilla Sabas RAMAN, MD  dicyclomine  (BENTYL ) 20 MG tablet Take 1 tablet (20 mg total) by mouth 2 (two) times daily. 09/30/23   Neysa Caron PARAS, DO  folic acid  (FOLVITE ) 400 MCG tablet Take 1 tablet by mouth every other day. 08/13/09   [provider]  furosemide  (LASIX ) 20 MG tablet Take 1 tablet (20 mg total) by mouth daily. Patient taking differently: Take 10 mg by mouth daily as needed for edema or fluid. 02/05/23   Antonio Cyndee [name], DO  ipratropium-albuterol  (DUONEB) 0.5-2.5 (3) MG/3ML SOLN Take 3 mLs by nebulization every 6 (six) hours as needed. 09/03/23   Lowne  Chase, Yvonne R, DO  levalbuterol  (XOPENEX ) 1.25 MG/3ML nebulizer solution Take 1.25 mg by nebulization every 6 (six) hours as needed for wheezing or shortness of breath. 09/03/23   Lowne Chase, Yvonne R, DO  losartan  (COZAAR ) 50 MG tablet Take 50 mg by mouth daily.    [provider]  magnesium  oxide (MAG-OX) 400 MG tablet Take 1 tablet (400 mg total) by mouth daily. 06/23/23   Duke, Jon Garre, PA  Multiple Vitamin (MULTIVITAMIN WITH MINERALS) TABS tablet Take 1 tablet by mouth once a week.    [provider]  mupirocin ointment (BACTROBAN) 2 % Apply 1 Application topically 2 (two) times daily. 08/09/23   [provider]  OFEV  100 MG CAPS Take 100 mg by mouth 2 (two) times daily. 06/19/22   [provider]  ondansetron  (  ZOFRAN -ODT) 4 MG disintegrating tablet Take 1 tablet (4 mg total) by mouth every 8 (eight) hours as needed for nausea or vomiting. 09/30/23   Neysa Caron PARAS, DO  oxyCODONE -acetaminophen  (PERCOCET/ROXICET) 5-325 MG tablet Take 1 tablet by mouth every 6 (six) hours as needed for severe pain (pain score 7-10). 09/27/23   Roselyn Carlin NOVAK, MD  promethazine -dextromethorphan (PROMETHAZINE -DM) 6.25-15 MG/5ML syrup Take 5 mLs by mouth 4 (four) times daily as needed for cough. 08/07/23   Antonio Cyndee [name], DO  Respiratory Therapy Supplies (FULL KIT NEBULIZER SET) MISC Use with DUONEB for COPD treatment 09/03/23   Lowne Chase, Yvonne R, DO    Current Outpatient Medications  Medication Sig Dispense Refill   colestipol  (COLESTID ) 1 g tablet Take 1 tablet (1 g total) by mouth daily. 90 tablet 0   diphenoxylate -atropine  (LOMOTIL ) 2.5-0.025 MG tablet Take 2 tablets by mouth 3 (three) times daily as needed for diarrhea or loose stools.     levothyroxine  (SYNTHROID ) 112 MCG tablet Take 1 tablet (112 mcg total) by mouth daily. 30 tablet 2   potassium chloride  (KLOR-CON  M) 20 MEQ tablet Take 1 tablet (20 mEq total) by mouth daily for 3 days. 3 tablet 0   vitamin  C (ASCORBIC ACID ) 500 MG tablet Take 500 mg by mouth 2 (two) times a week.     albuterol  (VENTOLIN  HFA) 108 (90 Base) MCG/ACT inhaler Inhale 2 puffs into the lungs every 6 (six) hours as needed (coughing). 8 g 1   ALPRAZolam  (XANAX ) 0.5 MG tablet Take 1 tablet (0.5 mg total) by mouth 2 (two) times daily as needed for anxiety. 10 tablet 0   amLODipine  (NORVASC ) 5 MG tablet Take 1 tablet (5 mg total) by mouth daily. 30 tablet 2   amoxicillin -clavulanate (AUGMENTIN ) 875-125 MG tablet Take 1 tablet by mouth 2 (two) times daily. 20 tablet 0   aspirin  EC 81 MG tablet Take 81 mg by mouth 3 (three) times a week. Swallow whole.     benzonatate  (TESSALON ) 200 MG capsule Take 1 capsule (200 mg total) by mouth 3 (three) times daily as needed for cough. 20 capsule 3   dicyclomine  (BENTYL ) 20 MG tablet Take 1 tablet (20 mg total) by mouth 2 (two) times daily. 20 tablet 0   folic acid  (FOLVITE ) 400 MCG tablet Take 1 tablet by mouth every other day.     furosemide  (LASIX ) 20 MG tablet Take 1 tablet (20 mg total) by mouth daily. (Patient taking differently: Take 10 mg by mouth daily as needed for edema or fluid.) 30 tablet 3   ipratropium-albuterol  (DUONEB) 0.5-2.5 (3) MG/3ML SOLN Take 3 mLs by nebulization every 6 (six) hours as needed. 360 mL 1   levalbuterol  (XOPENEX ) 1.25 MG/3ML nebulizer solution Take 1.25 mg by nebulization every 6 (six) hours as needed for wheezing or shortness of breath. 150 mL 1   losartan  (COZAAR ) 50 MG tablet Take 50 mg by mouth daily.     magnesium  oxide (MAG-OX) 400 MG tablet Take 1 tablet (400 mg total) by mouth daily. 90 tablet 3   Multiple Vitamin (MULTIVITAMIN WITH MINERALS) TABS tablet Take 1 tablet by mouth once a week.     mupirocin ointment (BACTROBAN) 2 % Apply 1 Application topically 2 (two) times daily.     OFEV  100 MG CAPS Take 100 mg by mouth 2 (two) times daily.     ondansetron  (ZOFRAN -ODT) 4 MG disintegrating tablet Take 1 tablet (4 mg total) by mouth every 8 (eight)  hours  as needed for nausea or vomiting. 20 tablet 0   oxyCODONE -acetaminophen  (PERCOCET/ROXICET) 5-325 MG tablet Take 1 tablet by mouth every 6 (six) hours as needed for severe pain (pain score 7-10). 15 tablet 0   promethazine -dextromethorphan (PROMETHAZINE -DM) 6.25-15 MG/5ML syrup Take 5 mLs by mouth 4 (four) times daily as needed for cough. 118 mL 0   Respiratory Therapy Supplies (FULL KIT NEBULIZER SET) MISC Use with DUONEB for COPD treatment 1 each 0   Current Facility-Administered Medications  Medication Dose Route Frequency Provider Last Rate Last Admin   0.9 %  sodium chloride  infusion  500 mL Intravenous Continuous Federico Rosario BROCKS, MD        Allergies as of 10/12/2023 - Review Complete 10/12/2023  Allergen Reaction Noted   Prednisone  Other (See Comments) 12/18/2011   Dilaudid  [hydromorphone ] Itching and Other (See Comments) 03/29/2022   Ipratropium bromide  Itching and Other (See Comments) 02/14/2015   Atrovent  hfa [ipratropium bromide  hfa] Itching and Other (See Comments) 02/14/2015   Cheratussin ac [guaifenesin -codeine ] Other (See Comments) 01/29/2015   Codeine  Itching, Nausea Only, and Other (See Comments) 05/25/2015   Diltiazem  Itching and Other (See Comments) 09/09/2021   Hydrocodone Itching 09/28/2012   Ibuprofen  Itching and Other (See Comments) 09/23/2004   Influenza vaccines Other (See Comments) 01/30/2015   Morphine  Itching 03/29/2022   Oxycodone  Itching 03/31/2022    Family History  Problem Relation Age of Onset   Ovarian cancer Mother    Uterine cancer Mother    Lung cancer Father    HIV Brother        74   Kidney cancer Brother    Lung cancer Brother    Other Brother        Mouth Cancer   Colon cancer Maternal Aunt    Heart disease Maternal Grandmother    Stroke Maternal Grandmother    Hypertension Maternal Grandmother    Diabetes Maternal Grandmother    Arthritis Other    Esophageal cancer Neg Hx    Liver disease Neg Hx    Rectal cancer Neg Hx     Stomach cancer Neg Hx     Social History   Socioeconomic History   Marital status: Married    Spouse name: [name]   Number of children: 3   Years of education: Jr college   Highest education level: Not on file  Occupational History   Occupation: Leisure centre manager: UNEMPLOYED   Occupation: retired  Tobacco Use   Smoking status: Never   Smokeless tobacco: Never  Vaping Use   Vaping status: Never Used  Substance and Sexual Activity   Alcohol  use: No    Alcohol /week: 0.0 standard drinks of alcohol    Drug use: No   Sexual activity: Not Currently  Other Topics Concern   Not on file  Social History Narrative   Lives with husband [name]   Caffeine use: coffee (2 cups per day)   Mostly right-handed   Social Drivers of Corporate investment banker Strain: Not on file  Food Insecurity: No Food Insecurity (08/31/2023)   Hunger Vital Sign    Worried About Running Out of Food in the Last Year: Never true    Ran Out of Food in the Last Year: Never true  Transportation Needs: No Transportation Needs (08/31/2023)   PRAPARE - Administrator, Civil Service (Medical): No    Lack of Transportation (Non-Medical): No  Physical Activity: Not on file  Stress: Not on file  Social Connections: Unknown (  08/25/2023)   Social Connection and Isolation Panel    Frequency of Communication with Friends and Family: Patient declined    Frequency of Social Gatherings with Friends and Family: Patient declined    Attends Religious Services: Patient declined    Database administrator or Organizations: Patient declined    Attends Banker Meetings: Patient declined    Marital Status: Married  Catering manager Violence: Not At Risk (08/31/2023)   Humiliation, Afraid, Rape, and Kick questionnaire    Fear of Current or Ex-Partner: No    Emotionally Abused: No    Physically Abused: No    Sexually Abused: No    Physical Exam: Vital signs in last 24 hours: BP (!) 149/93   Pulse 85    Temp 97.9 F (36.6 C) (Temporal)   Ht 5' 4 (1.626 m)   Wt 133 lb (60.3 kg)   SpO2 93%   BMI 22.83 kg/m  GEN: NAD EYE: Sclerae anicteric ENT: MMM CV: Non-tachycardic Pulm: No increased WOB GI: Soft NEURO:  Alert & Oriented   Estefana Kidney, MD [facility]   10/12/2023 2:46 PM

## 2023-10-12 NOTE — Op Note (Signed)
 [facility] Patient Name: Alexandria [name] Procedure Date: 10/12/2023 2:51 PM MRN: [id number] Endoscopist: Rosario Estefana Kidney , , [id number] Age: 78 Referring MD:  Date of Birth: [birth year] Gender: Female Account #: [id number] Procedure:                Upper GI endoscopy Indications:              Abdominal pain in the left upper quadrant, Melena,                            Diarrhea Medicines:                Monitored Anesthesia Care Procedure:                Pre-Anesthesia Assessment:                           - Prior to the procedure, a History and Physical                            was performed, and patient medications and                            allergies were reviewed. The patient's tolerance of                            previous anesthesia was also reviewed. The risks                            and benefits of the procedure and the sedation                            options and risks were discussed with the patient.                            All questions were answered, and informed consent                            was obtained. Prior Anticoagulants: The patient has                            taken no anticoagulant or antiplatelet agents. ASA                            Grade Assessment: II - A patient with mild systemic                            disease. After reviewing the risks and benefits,                            the patient was deemed in satisfactory condition to                            undergo the procedure.  After obtaining informed consent, the endoscope was                            passed under direct vision. Throughout the                            procedure, the patient's blood pressure, pulse, and                            oxygen saturations were monitored continuously. The                            Olympus Scope SN [id number] was introduced through the                            mouth, and advanced to the  second part of duodenum.                            The upper GI endoscopy was accomplished without                            difficulty. The patient tolerated the procedure                            well. Scope In: Scope Out: Findings:                 The examined esophagus was normal.                           Localized inflammation characterized by congestion                            (edema), erosions, erythema and granularity was                            found in the gastric antrum. Biopsies were taken                            with a cold forceps for histology.                           The examined duodenum was normal. Biopsies for                            histology were taken with a cold forceps for                            evaluation of celiac disease. Complications:            No immediate complications. Estimated Blood Loss:     Estimated blood loss was minimal. Impression:               - Normal esophagus.                           -  Gastritis. Biopsied.                           - Normal examined duodenum. Biopsied. Recommendation:           - Return patient to hospital ward for ongoing care.                           - Await pathology results.                           - Start pantoprazole  40 mg daily.                           - Return to GI clinic in 1-2 months.                           - The findings and recommendations were discussed                            with the patient. Dr Estefana Federico Rosario Estefana Federico,  10/12/2023 3:22:48 PM

## 2023-10-12 NOTE — Patient Instructions (Addendum)
 Resume previous diet Continue present medications Office visit 9/11 at 910 am Await pathology results  Handouts/information given for gastritis, pantoprazole   YOU HAD AN ENDOSCOPIC PROCEDURE TODAY AT THE [facility]:   Refer to the procedure report that was given to you for any specific questions about what was found during the examination.  If the procedure report does not answer your questions, please call your gastroenterologist to clarify.  If you requested that your care partner not be given the details of your procedure findings, then the procedure report has been included in a sealed envelope for you to review at your convenience later.  YOU SHOULD EXPECT: Some feelings of bloating in the abdomen. Passage of more gas than usual.  Walking can help get rid of the air that was put into your GI tract during the procedure and reduce the bloating. If you had a lower endoscopy (such as a colonoscopy or flexible sigmoidoscopy) you may notice spotting of blood in your stool or on the toilet paper. If you underwent a bowel prep for your procedure, you may not have a normal bowel movement for a few days.  Please Note:  You might notice some irritation and congestion in your nose or some drainage.  This is from the oxygen used during your procedure.  There is no need for concern and it should clear up in a day or so.  SYMPTOMS TO REPORT IMMEDIATELY:  Following upper endoscopy (EGD)  Vomiting of blood or coffee ground material  New chest pain or pain under the shoulder blades  Painful or persistently difficult swallowing  New shortness of breath  Fever of 100F or higher  Black, tarry-looking stools  For urgent or emergent issues, a gastroenterologist can be reached at any hour by calling [phone]. Do not use MyChart messaging for urgent concerns.   DIET:  We do recommend a small meal at first, but then you may proceed to your regular diet.  Drink plenty of fluids but you  should avoid alcoholic beverages for 24 hours.  ACTIVITY:  You should plan to take it easy for the rest of today and you should NOT DRIVE or use heavy machinery until tomorrow (because of the sedation medicines used during the test).    FOLLOW UP: Our staff will call the number listed on your records the next business day following your procedure.  We will call around 7:15- 8:00 am to check on you and address any questions or concerns that you may have regarding the information given to you following your procedure. If we do not reach you, we will leave a message.     If any biopsies were taken you will be contacted by phone or by letter within the next 1-3 weeks.  Please call us  at [phone] if you have not heard about the biopsies in 3 weeks.    SIGNATURES/CONFIDENTIALITY: You and/or your care partner have signed paperwork which will be entered into your electronic medical record.  These signatures attest to the fact that that the information above on your After Visit Summary has been reviewed and is understood.  Full responsibility of the confidentiality of this discharge information lies with you and/or your care-partner.

## 2023-10-12 NOTE — Progress Notes (Signed)
1502 Robinul 0.1 mg IV given due large amount of secretions upon assessment.  MD made aware, vss

## 2023-10-13 ENCOUNTER — Telehealth: Payer: Self-pay

## 2023-10-13 ENCOUNTER — Encounter: Admitting: Internal Medicine

## 2023-10-13 NOTE — Telephone Encounter (Signed)
  Follow up Call-     10/12/2023    1:45 PM 03/20/2022    7:27 AM  Call back number  Post procedure Call Back phone  # [phone]  Permission to leave phone message Yes Yes     Patient questions:  Do you have a fever, pain , or abdominal swelling? No. Pain Score  0 *  Have you tolerated food without any problems? Yes.    Have you been able to return to your normal activities? Yes.    Do you have any questions about your discharge instructions: Diet   No. Medications  No. Follow up visit  No.  Do you have questions or concerns about your Care? No.  Actions: * If pain score is 4 or above: No action needed, pain <4.

## 2023-10-15 ENCOUNTER — Telehealth (INDEPENDENT_AMBULATORY_CARE_PROVIDER_SITE_OTHER): Admitting: Physician Assistant

## 2023-10-15 DIAGNOSIS — J329 Chronic sinusitis, unspecified: Secondary | ICD-10-CM

## 2023-10-15 LAB — SURGICAL PATHOLOGY

## 2023-10-15 NOTE — Progress Notes (Signed)
 MyChart Video Visit    Virtual Visit via Video Note   This format is felt to be most appropriate for this patient at this time. Physical exam was limited by quality of the video and audio technology used for the visit.   Patient location: home Provider location: [email or web address]  I discussed the limitations of evaluation and management by telemedicine and the availability of in person appointments. The patient expressed understanding and agreed to proceed.  Patient: Alexandria [name]   DOB: Jun 24, 1945   78 y.o. Female  MRN: [id number] Visit Date: 10/15/2023  Today's healthcare provider: Manuelita Flatness, PA-C   Chief Complaint  Patient presents with   Nasal Congestion   Cough    On and off since May.  Taking mucinex    Subjective    HPI  Pt has been dealing with sequela of rhinoplasty, recurrent sinusitis for several weeks. She is very frustrated as she is unable to breath through her nose and it is making her nausea, dizzy, fatigued.  She is waiting on a sinus CT.  Medications: Outpatient Medications Prior to Visit  Medication Sig   albuterol  (VENTOLIN  HFA) 108 (90 Base) MCG/ACT inhaler Inhale 2 puffs into the lungs every 6 (six) hours as needed (coughing).   ALPRAZolam  (XANAX ) 0.5 MG tablet Take 1 tablet (0.5 mg total) by mouth 2 (two) times daily as needed for anxiety.   amLODipine  (NORVASC ) 5 MG tablet Take 1 tablet (5 mg total) by mouth daily.   amoxicillin -clavulanate (AUGMENTIN ) 875-125 MG tablet Take 1 tablet by mouth 2 (two) times daily.   aspirin  EC 81 MG tablet Take 81 mg by mouth 3 (three) times a week. Swallow whole.   benzonatate  (TESSALON ) 200 MG capsule Take 1 capsule (200 mg total) by mouth 3 (three) times daily as needed for cough.   colestipol  (COLESTID ) 1 g tablet Take 1 tablet (1 g total) by mouth daily.   dicyclomine  (BENTYL ) 20 MG tablet Take 1 tablet (20 mg total) by mouth 2 (two) times daily.   diphenoxylate -atropine  (LOMOTIL ) 2.5-0.025 MG tablet Take 2  tablets by mouth 3 (three) times daily as needed for diarrhea or loose stools.   folic acid  (FOLVITE ) 400 MCG tablet Take 1 tablet by mouth every other day.   furosemide  (LASIX ) 20 MG tablet Take 1 tablet (20 mg total) by mouth daily. (Patient taking differently: Take 10 mg by mouth daily as needed for edema or fluid.)   ipratropium-albuterol  (DUONEB) 0.5-2.5 (3) MG/3ML SOLN Take 3 mLs by nebulization every 6 (six) hours as needed.   levalbuterol  (XOPENEX ) 1.25 MG/3ML nebulizer solution Take 1.25 mg by nebulization every 6 (six) hours as needed for wheezing or shortness of breath.   levothyroxine  (SYNTHROID ) 112 MCG tablet Take 1 tablet (112 mcg total) by mouth daily.   losartan  (COZAAR ) 50 MG tablet Take 50 mg by mouth daily.   magnesium  oxide (MAG-OX) 400 MG tablet Take 1 tablet (400 mg total) by mouth daily.   Multiple Vitamin (MULTIVITAMIN WITH MINERALS) TABS tablet Take 1 tablet by mouth once a week.   mupirocin ointment (BACTROBAN) 2 % Apply 1 Application topically 2 (two) times daily.   OFEV  100 MG CAPS Take 100 mg by mouth 2 (two) times daily.   ondansetron  (ZOFRAN -ODT) 4 MG disintegrating tablet Take 1 tablet (4 mg total) by mouth every 8 (eight) hours as needed for nausea or vomiting.   oxyCODONE -acetaminophen  (PERCOCET/ROXICET) 5-325 MG tablet Take 1 tablet by mouth every 6 (six) hours as needed for  severe pain (pain score 7-10).   pantoprazole  (PROTONIX ) 40 MG tablet Take 1 tablet (40 mg total) by mouth daily.   potassium chloride  (KLOR-CON  M) 20 MEQ tablet Take 1 tablet (20 mEq total) by mouth daily for 3 days.   promethazine -dextromethorphan (PROMETHAZINE -DM) 6.25-15 MG/5ML syrup Take 5 mLs by mouth 4 (four) times daily as needed for cough.   Respiratory Therapy Supplies (FULL KIT NEBULIZER SET) MISC Use with DUONEB for COPD treatment   vitamin C  (ASCORBIC ACID ) 500 MG tablet Take 500 mg by mouth 2 (two) times a week.   No facility-administered medications prior to visit.     Review of Systems  Constitutional:  Positive for fatigue. Negative for fever.  HENT:  Positive for sinus pressure. Negative for sinus pain.   Respiratory:  Negative for cough and shortness of breath.   Cardiovascular:  Negative for chest pain and leg swelling.  Gastrointestinal:  Negative for abdominal pain.  Neurological:  Positive for dizziness. Negative for headaches.        Objective    There were no vitals taken for this visit.      Physical Exam Constitutional:      Appearance: Normal appearance. She is not ill-appearing.  Neurological:     Mental Status: She is oriented to person, place, and time.  Psychiatric:        Mood and Affect: Affect is angry.        Behavior: Behavior normal.        Assessment & Plan     1. Recurrent sinusitis (Primary) No symptoms are new. 2/2 to being unable to breath through one side of nose. She is following with plastics who performed her surgery. Upcoming CT scan.   Empathized to current feelings.   Advised we can ref to [facility] clinic if needed   I discussed the assessment and treatment plan with the patient. The patient was provided an opportunity to ask questions and all were answered. The patient agreed with the plan and demonstrated an understanding of the instructions.   The patient was advised to call back or seek an in-person evaluation if the symptoms worsen or if the condition fails to improve as anticipated.  Manuelita Flatness, PA-C [facility] at [facility] [phone] (phone) [phone] (fax)  [facility]

## 2023-10-18 ENCOUNTER — Other Ambulatory Visit: Payer: Self-pay

## 2023-10-18 ENCOUNTER — Telehealth: Payer: Self-pay | Admitting: Physician Assistant

## 2023-10-18 ENCOUNTER — Ambulatory Visit: Payer: Self-pay | Admitting: Internal Medicine

## 2023-10-18 DIAGNOSIS — R197 Diarrhea, unspecified: Secondary | ICD-10-CM

## 2023-10-18 MED ORDER — COLESTIPOL HCL 1 G PO TABS: 1.0000 g | ORAL_TABLET | Freq: Every day | ORAL | 0 refills | Status: DC

## 2023-10-18 NOTE — Telephone Encounter (Signed)
 Inbound call from patient stating Colestid  was supposed to be sent to her pharmacy but she has not heard anything. States she was prescribed medication in the hospital and they are unable to fill. Requesting a call back. Please advise, thank you.

## 2023-10-19 NOTE — Telephone Encounter (Signed)
 Call was transferred to me for triage. Patient reports Acid in her stomach when she stands up and post nasal drainage that was making her sick on the stomach. I told patient that her PCP would need to address the post nasal drainage, patient states that her main concern is the acid. A prescription for Pantoprazole  40 mg was sent to patient's pharmacy on 10/12/23, she has not picked this up yet. I advised that Protonix  40 mg will help with her acid reflux, she should take first thing in the morning on an empty stomach. I also informed patient that Colestipol  prescription was sent to her pharmacy yesterday. Patient will contact pharmacy regarding prescriptions. Patient verbalized understanding and had no concerns at the end of the call.

## 2023-10-19 NOTE — Telephone Encounter (Signed)
 Patient calling in regards to prescription. Please advise.

## 2023-10-20 NOTE — Telephone Encounter (Signed)
 Inbound call from patient stating that pharmacy is not filling colestipol  medication due to it being to soon. States they need a new prescription that states take 2 tablets twice a day. Please advise, thank you,

## 2023-10-21 ENCOUNTER — Telehealth: Payer: Self-pay

## 2023-10-21 NOTE — Telephone Encounter (Signed)
 Chart reviewed, I do not see an active order for a CT scan or anything about CT scan in the chart. Will leave for CMA when she returns

## 2023-10-21 NOTE — Telephone Encounter (Signed)
 Copied from CRM [id number]. Topic: Clinical - Request for Lab/Test Order >> Oct 21, 2023  8:43 AM Robinson H wrote: Reason for CRM: Patient states she's having a hard time scheduling the CAT scan through [facility] and wants to know can she have it done somewhere else. States she has been reaching out to them for the last 2 weeks and hasn't heard back.  Lonell [phone]

## 2023-10-22 ENCOUNTER — Telehealth: Payer: Self-pay | Admitting: Cardiology

## 2023-10-22 MED ORDER — COLESTIPOL HCL 1 G PO TABS: 2.0000 g | ORAL_TABLET | Freq: Two times a day (BID) | ORAL | 3 refills | Status: DC

## 2023-10-22 NOTE — Telephone Encounter (Signed)
 Rx sent to pharmacy as instructed per Dr Federico.

## 2023-10-22 NOTE — Addendum Note (Signed)
 Addended by: BENJAMINE NAT DEL on: 10/22/2023 01:45 PM   Modules accepted: Orders

## 2023-10-22 NOTE — Telephone Encounter (Signed)
   Pt c/o of Chest Pain: STAT if active CP, including tightness, pressure, jaw pain, radiating pain to shoulder/upper arm/back, CP unrelieved by Nitro. Symptoms reported of SOB, nausea, vomiting, sweating.  1. Are you having CP right now? No     2. Are you experiencing any other symptoms (ex. SOB, nausea, vomiting, sweating)? Some nausea and coughing   3. Is your CP continuous or coming and going? no   4. Have you taken Nitroglycerin ? no   5. How long have you been experiencing CP? Not chest pain. Patient is experiencing lightheadedness, pain in her neck and when she stands up she feels like her arms are heaving. This doesn't happen all of the time but when it does she cannot lift her arms and has to sit down so it'll pass.   She had an appt on Monday with Jackee but it had to be cancelled due to provider being out of the office.

## 2023-10-22 NOTE — Telephone Encounter (Signed)
 Pt states neck and arms feel odd. No SOB but some coughing. Pt had appointment with Jackee Alberts, NP on Monday was canceled due to provider. There are no current appointments available that I could find and covering does not currently know when/if they will work back into clinic. Will forward to scheduling to help find a opening.

## 2023-10-24 DIAGNOSIS — M950 Acquired deformity of nose: Secondary | ICD-10-CM | POA: Diagnosis not present

## 2023-10-24 DIAGNOSIS — J321 Chronic frontal sinusitis: Secondary | ICD-10-CM | POA: Diagnosis not present

## 2023-10-25 ENCOUNTER — Ambulatory Visit: Admitting: Nurse Practitioner

## 2023-10-25 NOTE — Telephone Encounter (Signed)
 Hold for Dr. Antonio. Was she supposed to have additional imaging from the CT in June?

## 2023-10-27 MED ORDER — COLESTIPOL HCL 1 G PO TABS: 2.0000 g | ORAL_TABLET | Freq: Two times a day (BID) | ORAL | 3 refills | Status: DC

## 2023-10-27 NOTE — Addendum Note (Signed)
 Addended by: BENJAMINE NAT DEL on: 10/27/2023 01:34 PM   Modules accepted: Orders

## 2023-10-27 NOTE — Telephone Encounter (Signed)
 Inbound call from patient states [facility] not [facility] colestipol , Patient would like prescription resent.

## 2023-10-27 NOTE — Telephone Encounter (Signed)
 Rx [facility]ipol  [facility].

## 2023-10-28 ENCOUNTER — Telehealth (INDEPENDENT_AMBULATORY_CARE_PROVIDER_SITE_OTHER): Admitting: Family Medicine

## 2023-10-28 DIAGNOSIS — R051 Acute cough: Secondary | ICD-10-CM | POA: Diagnosis not present

## 2023-10-28 MED ORDER — AMOXICILLIN-POT CLAVULANATE 875-125 MG PO TABS: 1.0000 | ORAL_TABLET | Freq: Two times a day (BID) | ORAL | 0 refills | Status: DC

## 2023-10-28 NOTE — Progress Notes (Signed)
 MyChart Video Visit    Virtual Visit via Video Note   This patient is at least at moderate risk for complications without adequate follow up. This format is felt to be most appropriate for this patient at this time. Physical exam was limited by quality of the video and audio technology used for the visit. heather was able to get the patient set up on a video visit.  Patient location: home Patient and provider in visit Provider location: Office  I discussed the limitations of evaluation and management by telemedicine and the availability of in person appointments. The patient expressed understanding and agreed to proceed.  Visit Date: 10/28/2023  Today's healthcare provider: [name] Antonio Cyndee, DO     Subjective:    Patient ID: Alexandria [name], female    DOB: 1946-03-31, 78 y.o.   MRN: [id number]  Chief Complaint  Patient presents with   Cough    HPI Patient is in today for c/o cough.  Discussed the use of AI scribe software for clinical note transcription with the patient, who gave verbal consent to proceed.  History of Present Illness Alexandria [name] is a 78 year old female who presents with persistent sinus issues and cough.  She has been experiencing ongoing sinus issues for three months, characterized by persistent mucus production from her nose. Despite a clear CT scan of the sinuses, the symptoms persist. She has an appointment scheduled with a specialist in early September.  She experiences a chronic cough, particularly when walking, which is severe enough to cause prolonged coughing episodes. The cough is accompanied by the production of 'plain vile' sputum. She has tried prescription cough syrup, promethazine  DM, but it was ineffective and caused vomiting.  She reports a lack of appetite, attributing it to the mucus making her feel sick. No fever is present, but she is tired of the ongoing symptoms. She has previously tried promethazine  DM for  her cough, but it was ineffective and caused vomiting. She has previously found Augmentin  effective for her symptoms.   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 the left ventricular wall motion is normal   Hypothyroidism    IPF (idiopathic pulmonary fibrosis) (HCC) 01/2018   Myocardial infarction (HCC)    hx of 30 years ago   Pancreatitis    TIA (transient ischemic attack)    8 years ago    Past Surgical History:  Procedure Laterality Date   BREAST BIOPSY Left    [facility]   CARDIAC CATHETERIZATION     10/2013   CATARACT EXTRACTION Bilateral 03/22/2018   FLEXIBLE SIGMOIDOSCOPY N/A 08/13/2022   Procedure: FLEXIBLE SIGMOIDOSCOPY;  Surgeon: Sheldon Standing, MD;  Location: [facility];  Service: General;  Laterality: N/A;   ILEOSTOMY CLOSURE N/A 11/19/2022   Procedure: OPEN TAKEDOWN OF LOOP ILEOSTOMY;  Surgeon: Sheldon Standing, MD;  Location: [facility];  Service: General;  Laterality: N/A;   LEFT HEART CATHETERIZATION WITH CORONARY  ANGIOGRAM N/A 10/25/2013   Procedure: LEFT HEART CATHETERIZATION WITH CORONARY ANGIOGRAM;  Surgeon: Debby DELENA Sor, MD;  Location: [facility] CATH LAB;  Service: Cardiovascular;  Laterality: N/A;   LYSIS OF ADHESION N/A 08/13/2022   Procedure: LYSIS OF ADHESION, DIVERTING ILEOSTOMY;  Surgeon: Sheldon Standing, MD;  Location: [facility];  Service: General;  Laterality: N/A;   NECK SURGERY     ACDF x 2      done in [place] more than 25 years, doesn't know levels   RHINOPLASTY  08/16/2023   uses [place] in her nostril   TOTAL  KNEE ARTHROPLASTY     Bilateral x's 2   VAGINAL HYSTERECTOMY  10/17/1998   [name]   VIDEO BRONCHOSCOPY Bilateral 01/24/2018   Procedure: VIDEO BRONCHOSCOPY WITH FLUORO;  Surgeon: Alaine Vicenta NOVAK, MD;  Location: [facility] ENDOSCOPY;  Service: Cardiopulmonary;  Laterality: Bilateral;   XI ROBOTIC ASSISTED COLOSTOMY TAKEDOWN N/A 08/13/2022   Procedure: ROBOTIC OSTOMY TAKEDOWN, SMALL BOWEL RESECTION X 2, BILATERAL TAP BLOCK, TISSUE PERFUSSION ASSESMENT VIA FIREFLY, MOBILAZATION OF SPLENIC FLEXURE;  Surgeon: Sheldon Standing, MD;  Location: [facility];  Service: General;  Laterality: N/A;   XI ROBOTIC ASSISTED LOWER ANTERIOR RESECTION N/A 03/31/2022   Procedure: XI ROBOTIC ASSISTED LOWER ANTERIOR RESECTION;  Surgeon: Sheldon Standing, MD;  Location: [facility];  Service: General;  Laterality: N/A;    Family History  Problem Relation Age of Onset   Ovarian cancer Mother    Uterine cancer Mother    Lung cancer Father    HIV Brother        1   Kidney cancer Brother    Lung cancer Brother    Other Brother        Mouth Cancer   Colon cancer Maternal Aunt    Heart disease Maternal Grandmother    Stroke Maternal Grandmother    Hypertension Maternal Grandmother    Diabetes Maternal Grandmother    Arthritis Other    Esophageal cancer Neg Hx    Liver disease Neg Hx    Rectal cancer Neg Hx    Stomach cancer Neg Hx     Social History   Socioeconomic History   Marital status: Married    Spouse name: [name]   Number of children: 3   Years of education: Jr college   Highest education level: Not on file  Occupational History   Occupation: Leisure centre manager: UNEMPLOYED   Occupation: retired  Tobacco Use   Smoking status: Never   Smokeless tobacco: Never  Vaping Use   Vaping status: Never Used  Substance and Sexual Activity   Alcohol  use: No    Alcohol /week: 0.0 standard drinks of alcohol    Drug use: No   Sexual activity: Not Currently  Other Topics Concern   Not on file  Social History  Narrative   Lives with husband [name]   Caffeine use: coffee (2 cups per day)   Mostly right-handed   Social Drivers of Corporate investment banker Strain: Not on file  Food Insecurity: No Food Insecurity (08/31/2023)   Hunger Vital Sign    Worried About Running Out of Food in the Last Year: Never true    Ran Out of Food in the Last Year: Never true  Transportation Needs: No Transportation Needs (08/31/2023)   PRAPARE - Administrator, Civil Service (Medical): No    Lack of Transportation (Non-Medical): No  Physical Activity: Not on file  Stress: Not on file  Social Connections:  Unknown (08/25/2023)   Social Connection and Isolation Panel    Frequency of Communication with Friends and Family: Patient declined    Frequency of Social Gatherings with Friends and Family: Patient declined    Attends Religious Services: Patient declined    Database administrator or Organizations: Patient declined    Attends Banker Meetings: Patient declined    Marital Status: Married  Catering manager Violence: Not At Risk (08/31/2023)   Humiliation, Afraid, Rape, and Kick questionnaire    Fear of Current or Ex-Partner: No    Emotionally Abused: No    Physically Abused: No    Sexually Abused: No    Outpatient Medications Prior to Visit  Medication Sig Dispense Refill   albuterol  (VENTOLIN  HFA) 108 (90 Base) MCG/ACT inhaler Inhale 2 puffs into the lungs every 6 (six) hours as needed (coughing). 8 g 1   ALPRAZolam  (XANAX ) 0.5 MG tablet Take 1 tablet (0.5 mg total) by mouth 2 (two) times daily as needed for anxiety. 10 tablet 0   amLODipine  (NORVASC ) 5 MG tablet Take 1 tablet (5 mg total) by mouth daily. 30 tablet 2   aspirin  EC 81 MG tablet Take 81 mg by mouth 3 (three) times a week. Swallow whole.     benzonatate  (TESSALON ) 200 MG capsule Take 1 capsule (200 mg total) by mouth 3 (three) times daily as needed for cough. 20 capsule 3   colestipol  (COLESTID ) 1 g tablet Take 2  tablets (2 g total) by mouth 2 (two) times daily. 120 tablet 3   dicyclomine  (BENTYL ) 20 MG tablet Take 1 tablet (20 mg total) by mouth 2 (two) times daily. 20 tablet 0   diphenoxylate -atropine  (LOMOTIL ) 2.5-0.025 MG tablet Take 2 tablets by mouth 3 (three) times daily as needed for diarrhea or loose stools.     folic acid  (FOLVITE ) 400 MCG tablet Take 1 tablet by mouth every other day.     furosemide  (LASIX ) 20 MG tablet Take 1 tablet (20 mg total) by mouth daily. (Patient taking differently: Take 10 mg by mouth daily as needed for edema or fluid.) 30 tablet 3   ipratropium-albuterol  (DUONEB) 0.5-2.5 (3) MG/3ML SOLN Take 3 mLs by nebulization every 6 (six) hours as needed. 360 mL 1   levalbuterol  (XOPENEX ) 1.25 MG/3ML nebulizer solution Take 1.25 mg by nebulization every 6 (six) hours as needed for wheezing or shortness of breath. 150 mL 1   levothyroxine  (SYNTHROID ) 112 MCG tablet Take 1 tablet (112 mcg total) by mouth daily. 30 tablet 2   losartan  (COZAAR ) 50 MG tablet Take 50 mg by mouth daily.     magnesium  oxide (MAG-OX) 400 MG tablet Take 1 tablet (400 mg total) by mouth daily. 90 tablet 3   Multiple Vitamin (MULTIVITAMIN WITH MINERALS) TABS tablet Take 1 tablet by mouth once a week.     mupirocin ointment (BACTROBAN) 2 % Apply 1 Application topically 2 (two) times daily.     OFEV  100 MG CAPS Take 100 mg by mouth 2 (two) times daily.     ondansetron  (ZOFRAN -ODT) 4 MG disintegrating tablet Take 1 tablet (4 mg total) by mouth every 8 (eight) hours as needed for nausea or vomiting. 20 tablet 0   oxyCODONE -acetaminophen  (PERCOCET/ROXICET) 5-325 MG tablet Take 1 tablet by mouth every 6 (six) hours as needed for severe pain (pain score 7-10). 15 tablet 0   pantoprazole  (PROTONIX ) 40 MG tablet Take 1 tablet (40 mg total) by mouth daily. 90 tablet 3   potassium  chloride (KLOR-CON  M) 20 MEQ tablet Take 1 tablet (20 mEq total) by mouth daily for 3 days. 3 tablet 0   promethazine -dextromethorphan  (PROMETHAZINE -DM) 6.25-15 MG/5ML syrup Take 5 mLs by mouth 4 (four) times daily as needed for cough. 118 mL 0   Respiratory Therapy Supplies (FULL KIT NEBULIZER SET) MISC Use with DUONEB for COPD treatment 1 each 0   vitamin C  (ASCORBIC ACID ) 500 MG tablet Take 500 mg by mouth 2 (two) times a week.     amoxicillin -clavulanate (AUGMENTIN ) 875-125 MG tablet Take 1 tablet by mouth 2 (two) times daily. 20 tablet 0   No facility-administered medications prior to visit.    Allergies  Allergen Reactions   Prednisone  Other (See Comments)    Was prescribed high doses at other times   Dilaudid  [Hydromorphone ] Itching and Other (See Comments)    Dilaudid  caused marked confusion   Ipratropium Bromide  Itching and Other (See Comments)    Mental Status Changes, Insomnia  Insomnia - Atrovent .   Atrovent  Hfa [Ipratropium Bromide  Hfa] Itching and Other (See Comments)    Pt could not sleep, Insomnia    Cheratussin Ac [Guaifenesin -Codeine ] Other (See Comments)    Headaches    Codeine  Itching, Nausea Only and Other (See Comments)    Pt takes promethazine  with codeine  at home   Diltiazem  Itching and Other (See Comments)    Makes patient sick. Shakes. GI   Hydrocodone Itching   Ibuprofen  Itching and Other (See Comments)    Gives false reading in blood   Influenza Vaccines Other (See Comments)    Pt reports heart attack after last flu shot   Morphine  Itching   Oxycodone  Itching    Review of Systems  Constitutional:  Negative for chills, fever and malaise/fatigue.  HENT:  Negative for congestion and hearing loss.   Eyes:  Negative for blurred vision and discharge.  Respiratory:  Negative for cough, sputum production and shortness of breath.   Cardiovascular:  Negative for chest pain, palpitations and leg swelling.  Gastrointestinal:  Negative for abdominal pain, blood in stool, constipation, diarrhea, heartburn, nausea and vomiting.  Genitourinary:  Negative for dysuria, frequency, hematuria and  urgency.  Musculoskeletal:  Negative for back pain, falls and myalgias.  Skin:  Negative for rash.  Neurological:  Negative for dizziness, sensory change, loss of consciousness, weakness and headaches.  Endo/Heme/Allergies:  Negative for environmental allergies. Does not bruise/bleed easily.  Psychiatric/Behavioral:  Negative for depression and suicidal ideas. The patient is not nervous/anxious and does not have insomnia.        Objective:    Physical Exam Vitals and nursing note reviewed.  Constitutional:      General: She is not in acute distress.    Appearance: Normal appearance. She is well-developed.  Pulmonary:     Effort: Pulmonary effort is normal.  Neurological:     Mental Status: She is alert and oriented to person, place, and time.  Psychiatric:        Mood and Affect: Mood normal.        Behavior: Behavior normal.        Thought Content: Thought content normal.        Judgment: Judgment normal.     There were no vitals taken for this visit. Wt Readings from Last 3 Encounters:  10/12/23 133 lb (60.3 kg)  09/26/23 127 lb (57.6 kg)  09/17/23 134 lb (60.8 kg)       Assessment & Plan:   Assessment and Plan Assessment &  Plan Chronic Cough with Sputum Production   She experiences a chronic cough, especially when walking, with 'plain vile' sputum production. There is no fever, but the cough is severe enough to cause emesis of cough syrup. Her appetite is diminished, likely due to mucus production. She agrees to start Augmentin  therapy. A chest x-ray is ordered to evaluate for underlying pulmonary issues. [facility] [address]  Chronic Sinusitis   She reports persistent sinusitis symptoms for three months, despite a clear CT scan of the sinuses, with significant nasal mucus production. She has an upcoming specialist appointment in September and expressed frustration with the prolonged symptoms, considering alternative care if unresolved.  Continue current management until the specialist appointment.    Acute cough -     DG Chest 2 View; Future -     Amoxicillin -Pot Clavulanate; Take 1 tablet by mouth 2 (two) times daily.  Dispense: 20 tablet; Refill: 0     I discussed the assessment and treatment plan with the patient. The patient was provided an opportunity to ask questions and all were answered. The patient agreed with the plan and demonstrated an understanding of the instructions.   The patient was advised to call back or seek an in-person evaluation if the symptoms worsen or if the condition fails to improve as anticipated.  [name] Antonio Cyndee, DO [facility] at [facility] [phone] (phone) [phone] (fax)  [facility]

## 2023-10-29 ENCOUNTER — Ambulatory Visit: Payer: Self-pay | Admitting: Family Medicine

## 2023-10-29 ENCOUNTER — Ambulatory Visit (HOSPITAL_BASED_OUTPATIENT_CLINIC_OR_DEPARTMENT_OTHER)
Admission: RE | Admit: 2023-10-29 | Discharge: 2023-10-29 | Disposition: A | Discharge status: Home / Self Care | Source: Ambulatory Visit | Attending: Family Medicine | Admitting: Family Medicine

## 2023-10-29 DIAGNOSIS — R051 Acute cough: Secondary | ICD-10-CM | POA: Insufficient documentation

## 2023-10-29 DIAGNOSIS — R0689 Other abnormalities of breathing: Secondary | ICD-10-CM | POA: Diagnosis not present

## 2023-11-01 ENCOUNTER — Ambulatory Visit: Attending: Cardiology | Admitting: Cardiology

## 2023-11-01 ENCOUNTER — Encounter: Payer: Self-pay | Admitting: Cardiology

## 2023-11-01 VITALS — BP 126/80 | HR 96 | Ht 64.0 in | Wt 128.0 lb

## 2023-11-01 DIAGNOSIS — I491 Atrial premature depolarization: Secondary | ICD-10-CM | POA: Diagnosis not present

## 2023-11-01 DIAGNOSIS — J849 Interstitial pulmonary disease, unspecified: Secondary | ICD-10-CM | POA: Insufficient documentation

## 2023-11-01 DIAGNOSIS — I10 Essential (primary) hypertension: Secondary | ICD-10-CM | POA: Diagnosis not present

## 2023-11-01 DIAGNOSIS — R000 Tachycardia, unspecified: Secondary | ICD-10-CM | POA: Insufficient documentation

## 2023-11-01 DIAGNOSIS — R002 Palpitations: Secondary | ICD-10-CM | POA: Insufficient documentation

## 2023-11-01 DIAGNOSIS — I4719 Other supraventricular tachycardia: Secondary | ICD-10-CM | POA: Diagnosis not present

## 2023-11-01 DIAGNOSIS — I77810 Thoracic aortic ectasia: Secondary | ICD-10-CM | POA: Insufficient documentation

## 2023-11-01 DIAGNOSIS — I5032 Chronic diastolic (congestive) heart failure: Secondary | ICD-10-CM | POA: Diagnosis not present

## 2023-11-01 DIAGNOSIS — Z79899 Other long term (current) drug therapy: Secondary | ICD-10-CM | POA: Insufficient documentation

## 2023-11-01 MED ORDER — LOSARTAN POTASSIUM 25 MG PO TABS
25.0000 mg | ORAL_TABLET | Freq: Every day | ORAL | 3 refills | Status: DC
Start: 2023-11-01 — End: 2024-01-30

## 2023-11-01 NOTE — Progress Notes (Signed)
 Cardiology Office Note:   Date:  11/01/2023  ID:  Alexandria [name], DOB [birth year], MRN [id number] PCP: Antonio Meth, [name], DO  [facility] Providers Cardiologist:  Jerel Balding, MD    History of Present Illness:    Discussed the use of AI scribe software for clinical note transcription with the patient, who gave verbal consent to proceed.  History of Present Illness Alexandria [name] is a 78 year old female with  ILD, chronic venous insufficiency, asthma, hypothyroidism, HFpEF, severe diverticulitis s/p partial colectomy and colostomy who presents with ongoing shortness of breath and stable palpitations. She is accompanied by her daughter.  She experiences shortness of breath when walking short distances, such as 20 to 30 feet, necessitating rest. She attributes her breathing difficulties to a recent nasal surgery causing nasal obstruction and dryness, making nasal breathing difficult. The air she breaths feels cold and painful, adding to her discomfort. She has an upcoming appointment with her nasal surgeon in September to address these issues.  She continues to experience palpitations, although a heart monitor previously placed did not reveal any significant arrhythmias. The frequency of palpitations has not increased since her last visit and overall feels this issue is stable/well managed.   She has a history of interstitial lung disease with chronic shortness of breath. Over the years, has seen providers with [facility], [facility]. She also reports traveling to see a [facility] provider. She has an appointment with her pulmonologist at [facility] this week. She expresses frustration that a pulmonologist at [facility] informed her rather bluntly that her condition was worsening/would be fatal.   She is currently taking amlodipine  5 mg for blood pressure but has not taken it in the past few days due to being on antibiotics and not wanting to take multiple pills at once.  She also takes Lasix  (furosemide ) 20 mg every three days, with potassium supplementation, for leg swelling. This was prescribed by her PCP. She says she stopped taking losartan  about six months ago.   She has a history of severe diverticulitis, for which she underwent partial colectomy and colostomy. Her colon was reconnected in August of last year.    Studies Reviewed:    07/30/23 Zio    The dominant rhythm is normal sinus rhythm with normal circadian rhythm variation.   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  The longest episode is 19 seconds in duration.  There is no evidence of atrial fibrillation.   There are rare premature ventricular contractions and a single 4 beat episode of nonsustained ventricular tachycardia.   There is no evidence of severe bradycardia sinus pauses or high-grade AV block.  Risk Assessment/Calculations:              Physical Exam:   VS:  BP 126/80   Pulse 96   Ht 5' 4 (1.626 m)   Wt 128 lb (58.1 kg)   SpO2 94%   BMI 21.97 kg/m    Wt Readings from Last 3 Encounters:  11/01/23 128 lb (58.1 kg)  10/12/23 133 lb (60.3 kg)  09/26/23 127 lb (57.6 kg)     Physical Exam Vitals reviewed.  Constitutional:      Appearance: Normal appearance.  HENT:     Head: Normocephalic.     Nose: Nose normal.  Eyes:     Pupils: Pupils are equal, round, and reactive  to light.  Cardiovascular:     Rate and Rhythm: Normal rate and regular rhythm.     Pulses: Normal pulses.     Heart sounds: Normal heart sounds. No murmur heard.    No friction rub. No gallop.  Pulmonary:     Effort: Pulmonary effort is normal.     Comments: Diffuse inspiratory crackles Abdominal:     General: Abdomen is flat.  Musculoskeletal:     Right lower leg: No edema.     Left lower leg: No edema.  Skin:    General: Skin is  warm and dry.     Capillary Refill: Capillary refill takes less than 2 seconds.  Neurological:     General: No focal deficit present.     Mental Status: She is alert and oriented to person, place, and time.  Psychiatric:        Mood and Affect: Mood normal.        Behavior: Behavior normal.        Thought Content: Thought content normal.        Judgment: Judgment normal.     ASSESSMENT AND PLAN:    Assessment & Plan Palpitations No significant atrial arrhythmias detected on heart monitor. Palpitations present but not increased in frequency. Management of palpitations is limited due to underlying conduction abnormalities and risk of heart block with beta blockers or calcium  channel blockers. - Continue to monitor symptoms  Grade I DD 2024 TTE with preserved LVEF, grade I DD. Euvolemic on exam today.  Leg swelling Currently managed with intermittent furosemide  use as prescribed by PCP. Amlodipine  may contribute to leg swelling. - Follow up with PCP regarding Lasix . Given history of orthostatic symptoms, would discourage regular diuretic use.   Hyperlipidemia LDL 113 as of 12/04/22. - Check fasting lipid panel.  Hypertension Hypertension previously managed with amlodipine  and losartan . Now no longer taking Losartan  but has also not taken Amlodipine  in several days. Discussed renal benefits to Losartan .  - Restart losartan  25 mg daily. - Monitor blood pressure at home. - Check metabolic panel in a couple of weeks.  Ascending aortic aneurysm 41mm as of 2024 TTE. - Repeat echo ordered.  Interstitial lung disease Interstitial lung disease with dyspnea on exertion and cough. Symptoms may be exacerbated by nasal obstruction post-rhinoplasty. Previous treatment with OFEV  was not tolerated. Pulmonology follow-up is scheduled to reassess management options. - Follow up with pulmonology for interstitial lung disease management. - Discuss potential new treatments or clinical trials for  IPF with pulmonologist.  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 - Follow up with ENT for nasal obstruction management.               Signed, Artist Pouch, PA-C

## 2023-11-01 NOTE — Patient Instructions (Signed)
 Medication Instructions:  Stop amlodipine  Decrease losartan  to 25 mg once a day *If you need a refill on your cardiac medications before your next appointment, please call your pharmacy*  Lab Work: In a couple of week we are going to need a fasting lipid panel and Bmet If you have labs (blood work) drawn today and your tests are completely normal, you will receive your results only by: MyChart Message (if you have MyChart) OR A paper copy in the mail If you have any lab test that is abnormal or we need to change your treatment, we will call you to review the results.  Testing/Procedures: No testing  Follow-Up: At [facility], you and your health needs are our priority.  As part of our continuing mission to provide you with exceptional heart care, our providers are all part of one team.  This team includes your primary Cardiologist (physician) and Advanced Practice Providers or APPs (Physician Assistants and Nurse Practitioners) who all work together to provide you with the care you need, when you need it.  Your next appointment:   6 month(s)  Provider:   Jerel Balding, MD    We recommend signing up for the patient portal called MyChart.  Sign up information is provided on this After Visit Summary.  MyChart is used to connect with patients for Virtual Visits (Telemedicine).  Patients are able to view lab/test results, encounter notes, upcoming appointments, etc.  Non-urgent messages can be sent to your provider as well.   To learn more about what you can do with MyChart, go to [email or web address].

## 2023-11-03 DIAGNOSIS — J84112 Idiopathic pulmonary fibrosis: Secondary | ICD-10-CM | POA: Diagnosis not present

## 2023-11-10 DIAGNOSIS — R918 Other nonspecific abnormal finding of lung field: Secondary | ICD-10-CM | POA: Diagnosis not present

## 2023-11-10 DIAGNOSIS — R1084 Generalized abdominal pain: Secondary | ICD-10-CM | POA: Diagnosis not present

## 2023-11-10 DIAGNOSIS — I10 Essential (primary) hypertension: Secondary | ICD-10-CM | POA: Diagnosis not present

## 2023-11-10 DIAGNOSIS — R1032 Left lower quadrant pain: Secondary | ICD-10-CM | POA: Diagnosis not present

## 2023-11-10 DIAGNOSIS — I4510 Unspecified right bundle-branch block: Secondary | ICD-10-CM | POA: Diagnosis not present

## 2023-11-10 DIAGNOSIS — R197 Diarrhea, unspecified: Secondary | ICD-10-CM | POA: Diagnosis not present

## 2023-11-10 DIAGNOSIS — Z1152 Encounter for screening for COVID-19: Secondary | ICD-10-CM | POA: Diagnosis not present

## 2023-11-10 DIAGNOSIS — K6389 Other specified diseases of intestine: Secondary | ICD-10-CM | POA: Diagnosis not present

## 2023-11-10 DIAGNOSIS — R110 Nausea: Secondary | ICD-10-CM | POA: Diagnosis not present

## 2023-11-10 NOTE — ED Notes (Signed)
 Patient made aware that her SpO2 drops to mid 80's without oxygen. Patient refused to wear oxygen. Patient has been educated on why she need oxygen.

## 2023-11-10 NOTE — ED Provider Notes (Signed)
 [facility] Emergency Department Provider Note Room: [facility]  B 18/B 18  Medical Decision Making   Assessment & Plan Medical Decision Making A 78 year old woman with a history of recent sinus surgery and prior colon resection presented with acute onset of profuse watery diarrhea, chills, and mild abdominal discomfort after completing a 5-day course of amoxicillin -clavulanic acid for suspected sinusitis.   Differential diagnosis includes, but is not limited to: - Antibiotic-associated diarrhea (including Clostridioides difficile infection): Acute watery diarrhea following amoxicillin -clavulanic acid is most likely antibiotic-associated diarrhea, with C. difficile considered but less likely due to the short interval since antibiotic initiation. - Diverticulitis: Diverticulitis was considered as a possible cause of symptoms given her surgical history and abdominal discomfort, but there was no focal pain or other classic features on presentation. - Also considered urinary tract infection, other intra-abdominal pathology, among other etiologies.  Labs, CT abdomen/pelvis to evaluate.  Will send stool studies if she is able to give a sample here.     Progress Notes    ED Course as of 11/10/23 2303  Wed Nov 10, 2023  2135 No further episodes of diarrhea. Still with LLQ abdominal pain. CT has been done, awaiting read  Discussed her intermittent dips into the 80s. She says she has pulmonary fibrosis, does not want to wear the O2 because she doesn't feel she needs it. No dyspnea or increased WOB  2242 CT Abdomen and Pelvis With IV Contrast IMPRESSION: 1.  Mild wall thickening of the transverse and descending colon with fluid-filled sigmoid colon and rectum, possible mild colitis. 2.  Small fluid collection posterior to the ascending colon, lateral to the right psoas muscle measuring 1.5 x 1.9 x 2.7 cm. No surrounding inflammatory change to suggest abscess. This may represent a  small postoperative seroma or lymphocele. Prior [facility] from [facility] has been requested for comparison. 3.  Coarse interstitial lung disease with bronchiectasis and basilar reticular opacities.   11:00 PM-patient is feeling much better, no further diarrhea here.  Discussed her CT results including some mild colitis, but discussed that given the concerns for C. difficile, we are not going to treat with any antibiotics.  Regarding her current antibiotics, advised that she discuss with ENT as certainly the risk and benefits are bit more complicated in this situation.  Discussed instructions for supportive care, strict ED return precautions, all questions answered, will discharge.   MDM Elements                 Disposition   Clinical Impression:     Diagnosis Comment Added By Time Added   Diarrhea, unspecified type  Wells Polly Sing, MD 11/10/2023 10:56 PM      Final Disposition: Discharge  History   Chief Complaint in Triage  Patient presents with  . Nausea, vomiting, diarrhea    Came to town to have deviated septum looked at. Pt was at doctors office and felt nauseous and is experiencing diarrhea.    History of Present Illness Alexandria [name] is a 78 year old female With past medical history including recent undergoing rhinoplasty in May, who presents with diarrhea and chills after recent antibiotic use.  She experienced sudden onset diarrhea today, characterized by frequent, watery stools, described as profuse. She was unable to determine if there was blood in the stool. Alongside the diarrhea, she feels very cold and unable to get warm. She has nausea and a desire to vomit, although the nausea has lessened. No recent exposure to sick  individuals.  She recently completed a five-day course of amoxicillin -clavulanic acid (875 mg) for a suspected sinus infection. Her sinus issues have persisted since undergoing nasal surgery on May 5th, with symptoms including difficulty  breathing and pain.  She has a history of colon reattachment surgery last year, following a hospitalization where six to seven inches of her colon were removed. This was performed by a doctor affiliated with [facility].  She monitors her oxygen levels at home, which typically range from 96-97%. However, she noted a drop to 88% during a recent visit to another doctor's office, which later improved to 90-94%.  She had a CT scan of her abdomen recently at a medical center in [facility].  Addition Historian(s): None     MEDICATIONS:  Patient's Medications   No medications on file    ALLERGIES: Allergies[1]  PAST MEDICAL HISTORY:  Past Medical History[2]  PAST SURGICAL HISTORY: Past Surgical History[3]  SOCIAL HISTORY:  Social History   Tobacco Use  . Smoking status: Not on file  . Smokeless tobacco: Not on file  Substance Use Topics  . Alcohol  use: Not on file       FAMILY HISTORY:  Family History[4]    Physical Exam    ED Triage Vitals  Temp 11/10/23 1800 97.7 F (36.5 C)  Temp src --   Heart Rate 11/10/23 1756 95  BP 11/10/23 1758 172/79  Respirations 11/10/23 1756 17  SpO2 11/10/23 1756 (!) 89 %   Reviewed vital signs and nursing note as charted by RN.  Physical Exam ABDOMEN: Tenderness on the side of the abdomen. Adult Medical Physical Exam CONSTITUTIONAL: Lying in bed, appears not to feel well but no acute distress, mentating appropriately and cooperative on exam HEAD: Normocephalic; atraumatic EYES: Conjunctivae clear, sclerae non-icteric ENT: Moist mucous membranes NECK: Supple without meningismus CARD: Skin is well perfused, no JVD RESP: Mildly increased work of breathing, clear lung sounds bilaterally, notably, she does have a new oxygen requirement, on 2 L via nasal cannula which was unable to wean down.. ABD/GI:  Nondistended, soft, mild tenderness to palpation in the right lower quadrant EXT: No long bone deformities  SKIN: Normal color for age and  race; warm; dry; no acute lesions noted NEURO: Alert, oriented, Moves all extremities PSYCH: The patient's mood and manner are appropriate. Grooming and personal hygiene are appropriate.     Results    I personally reviewed the results in the chart as listed below  Results for orders placed or performed during the hospital encounter of 11/10/23  COVID and Influenza A/B Amplified Probes  Result Value Ref Range   Influenza A, Amplified Probe Negative Negative   Influenza B, Amplified Probe Negative Negative   SARS-CoV-2 Negative Negative  CBC with Platelet and Differential  Result Value Ref Range   Differential Percent Diff %    Differential Absolute Diff Absolute    WBC 10.3 3.6 - 11.2 K/uL   RBC 4.53 3.63 - 4.92 M/uL   Hemoglobin 14.6 (H) 10.9 - 14.3 g/dL   Hematocrit 43 (H) 31 - 42 %   Mean Cell Volume 94 74 - 96 fL   Mean Cell Hemoglobin 32 24 - 33 pg   Mean Cell Hemoglobin Concentration 34 33 - 36 g/dL   RDW 84.8 87.6 - 82.9 %   Platelet Count 232 150 - 450 K/uL   Mean Platelet Volume 7.7 7.5 - 11.2 fL   Neutrophils 91 (H) 43 - 77 %   Lymphocytes 3 (L)  16 - 44 %   Monocytes 4 (L) 5 - 13 %   Eosinophils 1 1 - 8 %   Basophils 1 0 - 1 %   Neutrophils Absolute 9.4 (H) 1.8 - 7.8 K/uL   Lymphocytes Absolute 0.4 (L) 1.0 - 3.0 K/uL   Monocytes Absolute 0.4 0.3 - 1.0 K/uL   Eosinophils Absolute 0.1 0.0 - 0.5 K/uL   Basophils Absolute 0.1 0.0 - 0.1 K/uL   Nucleated RBCS 0 /100 WBC  CMP  Result Value Ref Range   Sodium 137 136 - 145 mmol/L   Potassium 4.1 3.5 - 5.1 mmol/L   Chloride 101 98 - 107 mmol/L   CO2 28 21 - 31 mmol/L   BUN 12 7 - 25 mg/dL   Creatinine 9.20 9.39 - 1.20 mg/dL   Glucose, Random 93 70 - 199 mg/dL   Calcium , Total 9.3 8.6 - 10.3 mg/dL   Osmolality (calculated) 273 270 - 295 mOsm/kg   Anion Gap 8 4 - 12   Albumin  3.7 3.5 - 5.7 g/dL   Bilirubin, Total 0.8 0.3 - 1.0 mg/dL   Alkaline Phosphatase 76 34 - 104 IU/L   ALT 7 7 - 52 IU/L   AST 20 13 - 39  IU/L   Protein, Total 7.1 6.4 - 8.9 g/dL   Albumin /Globulin Ratio 1.1 (L) 1.2 - 2.3  Magnesium  Level  Result Value Ref Range   Magnesium  1.9 1.7 - 2.4 mg/dL  eGFR (CKD-EPI)  Result Value Ref Range   eGFR >60 mL/min/1.9m2   Imaging Results          CT Abdomen and Pelvis With IV Contrast (Final result)  Result time 11/10/23 22:33:56    Final result by Reyes Inks, MD (11/10/23 22:33:56)             Impression:   IMPRESSION: 1.  Mild wall thickening of the transverse and descending colon with fluid-filled sigmoid colon and rectum, possible mild colitis. 2.  Small fluid collection posterior to the ascending colon, lateral to the right psoas muscle measuring 1.5 x 1.9 x 2.7 cm. No surrounding inflammatory change to suggest abscess. This may represent a small postoperative seroma or lymphocele. Prior CT from [facility] has been requested for comparison. 3.  Coarse interstitial lung disease with bronchiectasis and basilar reticular opacities.          Narrative:   CT ABDOMEN AND PELVIS WITH CONTRAST:  HISTORY: LLQ abd pain diarrhea  COMPARISON: None  INTRAVENOUS CONTRAST:  100 mL of Omnipaque  300.  TECHNIQUE: Spiral CT performed from the dome of the diaphragm to the pubic symphysis following IV contrast.    DICOM format image data available to non-affiliated external healthcare facilities or entities on a secure, media free, reciprocally searchable basis with patient authorization for at least a 12 month period after the study.  Dose reduction technique was used on this scan by utilizing automated exposure control, adjustment of the mA and/or kV according to the patient size.  FINDINGS:  Scout film: Unremarkable  Lung bases: Coarse interstitial lung disease with bronchiectasis and basilar reticular opacities.  Hepatobiliary: No focal hepatic lesion or intrahepatic biliary dilation. Diffuse pancreatic atrophy. Gallbladder appears normal. Normal splenic  parenchyma.  GU: Bilateral symmetrical renal parenchymal enhancement, without solid mass, hydronephrosis or nephrolithiasis. Normal ureteral caliber. No intraluminal obstruction or calculus. Normal adrenal glands. No abnormality identified in the bladder. Uterus is surgically absent. No adnexal abnormality.  GI: No evidence for bowel obstruction. Mild wall thickening of  the transverse and descending colon. Fluid-filled sigmoid colon and rectum. Postsurgical changes with bowel anastomosis in the central small bowel and rectosigmoid region. Small fluid collection posterior to the ascending colon, lateral to the right psoas muscle measuring 1.5 x 1.9 x 2.7 cm . No surrounding inflammatory change. The appendix is normal.  Retroperitoneum: No retroperitoneal adenopathy.  Cardiovascular: Normal caliber aorta.  Musculoskeletal: No focal lytic or blastic lesion. No fracture or acute derangement.                                  XR Chest 2 Views (In process)                   ECG Results   None         Wells Polly Sing, MD 11/10/23 2303      [1] Not on File [2] No past medical history on file. [3] No past surgical history on file. [4] No family history on file.

## 2023-11-11 ENCOUNTER — Other Ambulatory Visit: Payer: Self-pay

## 2023-11-11 ENCOUNTER — Telehealth: Payer: Self-pay | Admitting: Internal Medicine

## 2023-11-11 DIAGNOSIS — R197 Diarrhea, unspecified: Secondary | ICD-10-CM

## 2023-11-11 NOTE — Telephone Encounter (Addendum)
 Left message for patient to call back. [employer] kit placed at 2nd floor front desk.

## 2023-11-11 NOTE — Telephone Encounter (Signed)
 Returned patient call & she was seen yesterday with [facility] for diarrhea. She had 4 episodes of explosive diarrhea, and had chills/weakness shortly after. CT showed mild colitis. Denies any fever, n/v. She has already contacted Dr. Sheldon d/t some ongoing LLQ pain, waiting to hear back. She typically takes lomotil  & colestipol  which do help, however without medication she has diarrhea. ED suggested she be tested for cdiff, due to recent antibiotic use. Last seen with Dr. Federico on 7/8 for EGD.

## 2023-11-11 NOTE — Telephone Encounter (Signed)
 Inbound call from patient stating she went to the doctors office yesterday to have her septum looked at. Stated she has constant diarrhea and had chills and nausea. Stated she was then at the ED, and it was mentioned that she may have C Diff. Patient is requesting a call to discuss further. Please advise, thank you

## 2023-11-11 NOTE — Telephone Encounter (Signed)
 Patient returning call. Advised patient to pick up kit. Patient advised understanding. Please f/u if needing additional information.   Thank you

## 2023-11-11 NOTE — Telephone Encounter (Signed)
 Left message for patient to call back

## 2023-11-12 ENCOUNTER — Telehealth: Payer: Self-pay

## 2023-11-12 DIAGNOSIS — J4521 Mild intermittent asthma with (acute) exacerbation: Secondary | ICD-10-CM | POA: Diagnosis not present

## 2023-11-12 DIAGNOSIS — J019 Acute sinusitis, unspecified: Secondary | ICD-10-CM | POA: Diagnosis not present

## 2023-11-12 DIAGNOSIS — B9689 Other specified bacterial agents as the cause of diseases classified elsewhere: Secondary | ICD-10-CM | POA: Diagnosis not present

## 2023-11-12 NOTE — Telephone Encounter (Signed)
 Pt made aware of MD recommendations & advised on the importance of making sure her kit is labeled when she turns it into the lab.

## 2023-11-12 NOTE — Telephone Encounter (Signed)
 Spoke with Dr.Franko at [facility] , stated pt had an episode of hypotension at her office o 11/11/23 and had destats and was sent to the ED. Pt believes issues came from nasal surgery that ent performed .    Dr.Franko wants to speak with PCP in regards to pt care, personal cell phone given for call back. Would like  call back today   [phone]

## 2023-11-12 NOTE — Telephone Encounter (Signed)
 Pt has a follow up on 11/16/23

## 2023-11-15 ENCOUNTER — Ambulatory Visit: Admitting: Physical Therapy

## 2023-11-16 ENCOUNTER — Other Ambulatory Visit: Payer: Self-pay | Admitting: Family Medicine

## 2023-11-16 ENCOUNTER — Telehealth: Payer: Self-pay

## 2023-11-16 ENCOUNTER — Telehealth (INDEPENDENT_AMBULATORY_CARE_PROVIDER_SITE_OTHER): Admitting: Family Medicine

## 2023-11-16 DIAGNOSIS — J329 Chronic sinusitis, unspecified: Secondary | ICD-10-CM | POA: Diagnosis not present

## 2023-11-16 DIAGNOSIS — R197 Diarrhea, unspecified: Secondary | ICD-10-CM

## 2023-11-16 DIAGNOSIS — J84112 Idiopathic pulmonary fibrosis: Secondary | ICD-10-CM

## 2023-11-16 DIAGNOSIS — J449 Chronic obstructive pulmonary disease, unspecified: Secondary | ICD-10-CM

## 2023-11-16 DIAGNOSIS — E039 Hypothyroidism, unspecified: Secondary | ICD-10-CM

## 2023-11-16 DIAGNOSIS — Z8673 Personal history of transient ischemic attack (TIA), and cerebral infarction without residual deficits: Secondary | ICD-10-CM

## 2023-11-16 NOTE — Telephone Encounter (Signed)
 Copied from CRM #[id number]. Topic: Clinical - Request for Lab/Test Order >> Nov 16, 2023  2:20 PM Alexandria [name] wrote: Reason for CRM: Patient is requesting a order for physical therapy or for home health services. Patient is requesting a call back and can be reached at [phone] once the orders are placed. She states she forgot to mention it at her appointment today with DO Lowne Chase.

## 2023-11-16 NOTE — Progress Notes (Signed)
 MyChart Video Visit    Virtual Visit via Video Note   This patient is at least at moderate risk for complications without adequate follow up. This format is felt to be most appropriate for this patient at this time. Physical exam was limited by quality of the video and audio technology used for the visit. heather was able to get the patient set up on a video visit.  Patient location: home Patient and provider in visit Provider location: Office  I discussed the limitations of evaluation and management by telemedicine and the availability of in person appointments. The patient expressed understanding and agreed to proceed.  Visit Date: 11/16/2023  Today's healthcare provider: [name] Antonio Cyndee, DO     Subjective:    Patient ID: Alexandria [name], female    DOB: Mar 23, 1946, 78 y.o.   MRN: [id number]  Chief Complaint  Patient presents with   ED follow up    HPI Patient is in today for f/u sinuses.   Discussed the use of AI scribe software for clinical note transcription with the patient, who gave verbal consent to proceed.  History of Present Illness Alexandria [name] is a 78 year old female with idiopathic pulmonary fibrosis who presents with persistent nasal congestion and mucus production.  She experiences persistent nasal congestion and mucus production, describing it as 'waking up with mucus' and 'going to bed with mucus.' Mucus often gets stuck in her throat, leading to morning episodes of 'upchuck of just clear mucus.' Her left nostril is so blocked that a COVID test could not be performed on that side. These symptoms have persisted for three months.  She has a history of idiopathic pulmonary fibrosis and is under regular follow-up with her pulmonologist, who was satisfied with her chest examination and scheduled a follow-up in six months. She is also under the care of a cardiologist and has a test scheduled for September 2nd.  She has been treated with  prednisone , which provided slight improvement, but significant symptoms persist. She has been on multiple antibiotics in the past, which have not been effective.  She describes a history of sinus surgery, which she feels did not address her sinus issues. She reports ongoing 'terrible headaches of sinus all the time' and 'terrible drainage.' She also experiences a sensation of her nose being cold and burning when she breathes, and perceives unpleasant smells like 'garbage' or 'burnt pots.'  She has experienced episodes of severe mucus production leading to 'explosions' that required medical attention, resulting in hospitalization at [facility]. These episodes have been physically and mentally devastating for her, impacting her ability to sing and hold notes, which she finds particularly distressing.    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 the left ventricular wall motion is normal   Hypothyroidism    IPF (idiopathic pulmonary fibrosis) (HCC) 01/2018   Myocardial infarction (HCC)    hx of 30 years ago  Pancreatitis    TIA (transient ischemic attack)    8 years ago    Past Surgical History:  Procedure Laterality Date   BREAST BIOPSY Left    [facility]   CARDIAC CATHETERIZATION     10/2013   CATARACT EXTRACTION Bilateral 03/22/2018   FLEXIBLE SIGMOIDOSCOPY N/A 08/13/2022   Procedure: FLEXIBLE SIGMOIDOSCOPY;  Surgeon: Sheldon Standing, MD;  Location: [facility];  Service: General;  Laterality: N/A;   ILEOSTOMY CLOSURE N/A 11/19/2022    Procedure: OPEN TAKEDOWN OF LOOP ILEOSTOMY;  Surgeon: Sheldon Standing, MD;  Location: [facility];  Service: General;  Laterality: N/A;   LEFT HEART CATHETERIZATION WITH CORONARY ANGIOGRAM N/A 10/25/2013   Procedure: LEFT HEART CATHETERIZATION WITH CORONARY ANGIOGRAM;  Surgeon: Debby DELENA Sor, MD;  Location: [facility] CATH LAB;  Service: Cardiovascular;  Laterality: N/A;   LYSIS OF ADHESION N/A 08/13/2022   Procedure: LYSIS OF ADHESION, DIVERTING ILEOSTOMY;  Surgeon: Sheldon Standing, MD;  Location: [facility];  Service: General;  Laterality: N/A;   NECK SURGERY     ACDF x 2      done in [place] more than 25 years, doesn't know levels   RHINOPLASTY  08/16/2023   uses [place] in her nostril   TOTAL KNEE ARTHROPLASTY     Bilateral x's 2   VAGINAL HYSTERECTOMY  10/17/1998   [name]   VIDEO BRONCHOSCOPY Bilateral 01/24/2018   Procedure: VIDEO BRONCHOSCOPY WITH FLUORO;  Surgeon: Alaine Vicenta NOVAK, MD;  Location: [facility] ENDOSCOPY;  Service: Cardiopulmonary;  Laterality: Bilateral;   XI ROBOTIC ASSISTED COLOSTOMY TAKEDOWN N/A 08/13/2022   Procedure: ROBOTIC OSTOMY TAKEDOWN, SMALL BOWEL RESECTION X 2, BILATERAL TAP BLOCK, TISSUE PERFUSSION ASSESMENT VIA FIREFLY, MOBILAZATION OF SPLENIC FLEXURE;  Surgeon: Sheldon Standing, MD;  Location: [facility];  Service: General;  Laterality: N/A;   XI ROBOTIC ASSISTED LOWER ANTERIOR RESECTION N/A 03/31/2022   Procedure: XI ROBOTIC ASSISTED LOWER ANTERIOR RESECTION;  Surgeon: Sheldon Standing, MD;  Location: [facility];  Service: General;  Laterality: N/A;    Family History  Problem Relation Age of Onset   Ovarian cancer Mother    Uterine cancer Mother    Lung cancer Father    HIV Brother        8   Kidney cancer Brother    Lung cancer Brother    Other Brother        Mouth Cancer   Colon cancer Maternal Aunt    Heart disease Maternal Grandmother    Stroke Maternal Grandmother    Hypertension Maternal Grandmother    Diabetes Maternal Grandmother    Arthritis Other    Esophageal  cancer Neg Hx    Liver disease Neg Hx    Rectal cancer Neg Hx    Stomach cancer Neg Hx     Social History   Socioeconomic History   Marital status: Married    Spouse name: [name]   Number of children: 3   Years of education: Jr college   Highest education level: Not on file  Occupational History   Occupation: Leisure centre manager: UNEMPLOYED   Occupation: retired  Tobacco Use   Smoking status: Never   Smokeless tobacco: Never  Vaping Use   Vaping status: Never Used  Substance and Sexual Activity   Alcohol  use: No    Alcohol /week: 0.0 standard drinks of alcohol    Drug use: No   Sexual activity: Not Currently  Other Topics Concern   Not on file  Social History Narrative   Lives with husband  Craig   Caffeine use: coffee (2 cups per day)   Mostly right-handed   Social Drivers of Corporate investment banker Strain: Not on file  Food Insecurity: Low Risk  (11/03/2023)   Received from [facility]   Hunger Vital Sign    Within the past 12 months, you worried that your food would run out before you got money to buy more: Never true    Within the past 12 months, the food you bought just didn't last and you didn't have money to get more. : Never true  Transportation Needs: No Transportation Needs (11/03/2023)   Received from [facility]    In the past 12 months, has lack of reliable transportation kept you from medical appointments, meetings, work or from getting things needed for daily living? : No  Physical Activity: Not on file  Stress: Not on file  Social Connections: Unknown (08/25/2023)   Social Connection and Isolation Panel    Frequency of Communication with Friends and Family: Patient declined    Frequency of Social Gatherings with Friends and Family: Patient declined    Attends Religious Services: Patient declined    Database administrator or Organizations: Patient declined    Attends Banker Meetings: Patient declined    Marital  Status: Married  Catering manager Violence: Not At Risk (08/31/2023)   Humiliation, Afraid, Rape, and Kick questionnaire    Fear of Current or Ex-Partner: No    Emotionally Abused: No    Physically Abused: No    Sexually Abused: No    Outpatient Medications Prior to Visit  Medication Sig Dispense Refill   amoxicillin -clavulanate (AUGMENTIN ) 875-125 MG tablet Take 1 tablet by mouth 2 (two) times daily. 20 tablet 0   benzonatate  (TESSALON ) 200 MG capsule Take 1 capsule (200 mg total) by mouth 3 (three) times daily as needed for cough. 20 capsule 3   colestipol  (COLESTID ) 1 g tablet Take 2 tablets (2 g total) by mouth 2 (two) times daily. 120 tablet 3   diphenoxylate -atropine  (LOMOTIL ) 2.5-0.025 MG tablet Take 2 tablets by mouth 3 (three) times daily as needed for diarrhea or loose stools.     furosemide  (LASIX ) 20 MG tablet Take 1 tablet (20 mg total) by mouth daily. (Patient taking differently: Take 10 mg by mouth daily as needed for edema or fluid.) 30 tablet 3   levalbuterol  (XOPENEX ) 1.25 MG/3ML nebulizer solution Take 1.25 mg by nebulization every 6 (six) hours as needed for wheezing or shortness of breath. 150 mL 1   levothyroxine  (SYNTHROID ) 112 MCG tablet Take 1 tablet (112 mcg total) by mouth daily. 30 tablet 2   losartan  (COZAAR ) 25 MG tablet Take 1 tablet (25 mg total) by mouth daily. 90 tablet 3   magnesium  oxide (MAG-OX) 400 MG tablet Take 1 tablet (400 mg total) by mouth daily. 90 tablet 3   Multiple Vitamin (MULTIVITAMIN WITH MINERALS) TABS tablet Take 1 tablet by mouth once a week.     potassium chloride  (KLOR-CON  M) 20 MEQ tablet Take 1 tablet (20 mEq total) by mouth daily for 3 days. 3 tablet 0   Respiratory Therapy Supplies (FULL KIT NEBULIZER SET) MISC Use with DUONEB for COPD treatment 1 each 0   vitamin C  (ASCORBIC ACID ) 500 MG tablet Take 500 mg by mouth 2 (two) times a week.     albuterol  (VENTOLIN  HFA) 108 (90 Base) MCG/ACT inhaler Inhale 2 puffs into the lungs every 6  (six) hours as  needed (coughing). (Patient not taking: Reported on 11/16/2023) 8 g 1   ALPRAZolam  (XANAX ) 0.5 MG tablet Take 1 tablet (0.5 mg total) by mouth 2 (two) times daily as needed for anxiety. (Patient not taking: Reported on 11/16/2023) 10 tablet 0   aspirin  EC 81 MG tablet Take 81 mg by mouth 3 (three) times a week. Swallow whole. (Patient not taking: Reported on 11/16/2023)     dicyclomine  (BENTYL ) 20 MG tablet Take 1 tablet (20 mg total) by mouth 2 (two) times daily. (Patient not taking: Reported on 11/16/2023) 20 tablet 0   folic acid  (FOLVITE ) 400 MCG tablet Take 1 tablet by mouth every other day. (Patient not taking: Reported on 11/16/2023)     ipratropium-albuterol  (DUONEB) 0.5-2.5 (3) MG/3ML SOLN Take 3 mLs by nebulization every 6 (six) hours as needed. (Patient not taking: Reported on 11/16/2023) 360 mL 1   mupirocin ointment (BACTROBAN) 2 % Apply 1 Application topically 2 (two) times daily. (Patient not taking: Reported on 11/16/2023)     OFEV  100 MG CAPS Take 100 mg by mouth 2 (two) times daily. (Patient not taking: Reported on 11/16/2023)     ondansetron  (ZOFRAN -ODT) 4 MG disintegrating tablet Take 1 tablet (4 mg total) by mouth every 8 (eight) hours as needed for nausea or vomiting. (Patient not taking: Reported on 11/16/2023) 20 tablet 0   oxyCODONE -acetaminophen  (PERCOCET/ROXICET) 5-325 MG tablet Take 1 tablet by mouth every 6 (six) hours as needed for severe pain (pain score 7-10). (Patient not taking: Reported on 11/16/2023) 15 tablet 0   pantoprazole  (PROTONIX ) 40 MG tablet Take 1 tablet (40 mg total) by mouth daily. (Patient not taking: Reported on 11/16/2023) 90 tablet 3   promethazine -dextromethorphan (PROMETHAZINE -DM) 6.25-15 MG/5ML syrup Take 5 mLs by mouth 4 (four) times daily as needed for cough. (Patient not taking: Reported on 11/16/2023) 118 mL 0   No facility-administered medications prior to visit.    Allergies  Allergen Reactions   Prednisone  Other (See Comments)    Was  prescribed high doses at other times   Dilaudid  [Hydromorphone ] Itching and Other (See Comments)    Dilaudid  caused marked confusion   Ipratropium Bromide  Itching and Other (See Comments)    Mental Status Changes, Insomnia  Insomnia - Atrovent .   Atrovent  Hfa [Ipratropium Bromide  Hfa] Itching and Other (See Comments)    Pt could not sleep, Insomnia    Cheratussin Ac [Guaifenesin -Codeine ] Other (See Comments)    Headaches    Codeine  Itching, Nausea Only and Other (See Comments)    Pt takes promethazine  with codeine  at home   Diltiazem  Itching and Other (See Comments)    Makes patient sick. Shakes. GI   Hydrocodone Itching   Ibuprofen  Itching and Other (See Comments)    Gives false reading in blood   Influenza Vaccines Other (See Comments)    Pt reports heart attack after last flu shot   Morphine  Itching   Oxycodone  Itching    Review of Systems  Constitutional:  Negative for fever and malaise/fatigue.  HENT:  Positive for congestion and sinus pain.   Eyes:  Negative for blurred vision.  Respiratory:  Negative for shortness of breath.   Cardiovascular:  Negative for chest pain, palpitations and leg swelling.  Gastrointestinal:  Negative for abdominal pain, blood in stool and nausea.  Genitourinary:  Negative for dysuria and frequency.  Musculoskeletal:  Negative for falls.  Skin:  Negative for rash.  Neurological:  Negative for dizziness, loss of consciousness and headaches.  Endo/Heme/Allergies:  Negative  for environmental allergies.  Psychiatric/Behavioral:  Negative for depression. The patient is not nervous/anxious.        Objective:    Physical Exam Vitals and nursing note reviewed.  Pulmonary:     Effort: Pulmonary effort is normal.  Psychiatric:        Mood and Affect: Mood normal.        Behavior: Behavior normal.        Thought Content: Thought content normal.        Judgment: Judgment normal.     There were no vitals taken for this visit. Wt Readings  from Last 3 Encounters:  11/01/23 128 lb (58.1 kg)  10/12/23 133 lb (60.3 kg)  09/26/23 127 lb (57.6 kg)       Assessment & Plan:  Recurrent sinusitis -     Ambulatory referral to ENT    Assessment and Plan Assessment & Plan Chronic nasal obstruction with excessive mucus drainage, chronic sinus headaches and drainage, and altered sense of smell (cacosmia)   She has experienced chronic nasal obstruction, excessive mucus drainage, sinus headaches, and altered sense of smell for three months. Previous treatments with antibiotics and prednisone  have provided minimal relief. Symptoms include an inability to pass a Q-tip through the left nostril, persistent mucus production, and a distorted sense of smell described as garbage or burnt pots. Differential diagnosis includes sinusitis or other nasal pathology. Continued antibiotic use poses a risk of C. difficile infection, a concern given her multiple antibiotic courses. These symptoms have caused significant physical and mental distress, affecting her quality of life. She will be referred to another specialist for further evaluation and management of her nasal and sinus issues. Further antibiotic use will be avoided. She will complete the current course of prednisone . Contact [facility] for potential evaluation and transfer of medical records.   I discussed the assessment and treatment plan with the patient. The patient was provided an opportunity to ask questions and all were answered. The patient agreed with the plan and demonstrated an understanding of the instructions.   The patient was advised to call back or seek an in-person evaluation if the symptoms worsen or if the condition fails to improve as anticipated.  [name] Antonio Cyndee, DO [facility] [phone] (phone) [phone] (fax)  [facility]

## 2023-11-17 ENCOUNTER — Other Ambulatory Visit: Payer: Self-pay | Admitting: Family Medicine

## 2023-11-17 ENCOUNTER — Telehealth: Payer: Self-pay

## 2023-11-17 DIAGNOSIS — R051 Acute cough: Secondary | ICD-10-CM

## 2023-11-17 MED ORDER — PROMETHAZINE-DM 6.25-15 MG/5ML PO SYRP: 5.0000 mL | ORAL_SOLUTION | Freq: Four times a day (QID) | ORAL | 0 refills | Status: DC | PRN

## 2023-11-17 NOTE — Telephone Encounter (Signed)
 Pt called and lvm to notify pt

## 2023-11-17 NOTE — Telephone Encounter (Unsigned)
 Copied from CRM [id number]. Topic: Referral - Question >> Nov 17, 2023  9:52 AM Thersia BROCKS wrote: Reason for CRM: Patient called in stated that the [facility] clinic needs referral sent to   [phone]

## 2023-11-17 NOTE — Telephone Encounter (Signed)
 Copied from CRM [id number]. Topic: Clinical - Medical Advice >> Nov 17, 2023  9:55 AM Alexandria [name] wrote: Reason for CRM: Patient called in regarding her cough, wanted to know what she needs to do or take for it, would like a callback regarding this

## 2023-11-18 ENCOUNTER — Other Ambulatory Visit: Payer: Self-pay | Admitting: Family Medicine

## 2023-11-18 DIAGNOSIS — J329 Chronic sinusitis, unspecified: Secondary | ICD-10-CM

## 2023-11-18 NOTE — Telephone Encounter (Signed)
 Do you know what this is in regards too?

## 2023-11-19 NOTE — Telephone Encounter (Signed)
 Referral already placed.

## 2023-11-25 ENCOUNTER — Telehealth: Payer: Self-pay

## 2023-11-25 NOTE — Telephone Encounter (Signed)
 Copied from CRM #[id number]. Topic: General - Other >> Nov 25, 2023  9:22 AM Franky GRADE wrote: Reason for CRM: [facility] with [facility] home health is calling because patient was suppose to start physical therapy today but refused as she did not have anyone to assist putting her dogs away. She will be starting next week. Best call back number for any questions or concerns is [phone].

## 2023-12-02 DIAGNOSIS — J309 Allergic rhinitis, unspecified: Secondary | ICD-10-CM | POA: Diagnosis not present

## 2023-12-02 DIAGNOSIS — R059 Cough, unspecified: Secondary | ICD-10-CM | POA: Diagnosis not present

## 2023-12-02 DIAGNOSIS — K219 Gastro-esophageal reflux disease without esophagitis: Secondary | ICD-10-CM | POA: Diagnosis not present

## 2023-12-03 ENCOUNTER — Telehealth: Payer: Self-pay

## 2023-12-03 DIAGNOSIS — J84112 Idiopathic pulmonary fibrosis: Secondary | ICD-10-CM | POA: Diagnosis not present

## 2023-12-03 DIAGNOSIS — J3489 Other specified disorders of nose and nasal sinuses: Secondary | ICD-10-CM | POA: Diagnosis not present

## 2023-12-03 DIAGNOSIS — M1990 Unspecified osteoarthritis, unspecified site: Secondary | ICD-10-CM | POA: Diagnosis not present

## 2023-12-03 DIAGNOSIS — J329 Chronic sinusitis, unspecified: Secondary | ICD-10-CM | POA: Diagnosis not present

## 2023-12-03 DIAGNOSIS — E039 Hypothyroidism, unspecified: Secondary | ICD-10-CM | POA: Diagnosis not present

## 2023-12-03 NOTE — Telephone Encounter (Signed)
 Verbal orders given

## 2023-12-03 NOTE — Telephone Encounter (Signed)
 Copied from CRM [id number]. Topic: Clinical - Home Health Verbal Orders >> Dec 03, 2023 10:45 AM Thersia BROCKS wrote: Caller/Agency: Garnette / [facility]  Callback Number: [phone] Service Requested: Physical Therapy Frequency: 1 week 5  Any new concerns about the patient? No

## 2023-12-07 ENCOUNTER — Ambulatory Visit (HOSPITAL_COMMUNITY)
Admission: RE | Admit: 2023-12-07 | Discharge: 2023-12-07 | Disposition: A | Discharge status: Home / Self Care | Source: Ambulatory Visit | Attending: Cardiology | Admitting: Cardiology

## 2023-12-07 ENCOUNTER — Encounter: Payer: Self-pay | Admitting: Sports Medicine

## 2023-12-07 DIAGNOSIS — R002 Palpitations: Secondary | ICD-10-CM | POA: Diagnosis not present

## 2023-12-07 DIAGNOSIS — I4719 Other supraventricular tachycardia: Secondary | ICD-10-CM | POA: Diagnosis not present

## 2023-12-07 DIAGNOSIS — I351 Nonrheumatic aortic (valve) insufficiency: Secondary | ICD-10-CM | POA: Insufficient documentation

## 2023-12-07 DIAGNOSIS — Z79899 Other long term (current) drug therapy: Secondary | ICD-10-CM | POA: Diagnosis not present

## 2023-12-07 DIAGNOSIS — I491 Atrial premature depolarization: Secondary | ICD-10-CM | POA: Diagnosis not present

## 2023-12-07 DIAGNOSIS — R000 Tachycardia, unspecified: Secondary | ICD-10-CM | POA: Diagnosis not present

## 2023-12-07 LAB — ECHOCARDIOGRAM COMPLETE
AR max vel: 2.76 cm2
AV Area VTI: 2.72 cm2
AV Area mean vel: 2.6 cm2
AV Mean grad: 3 mmHg
AV Peak grad: 5.2 mmHg
Ao pk vel: 1.15 m/s
Area-P 1/2: 3.51 cm2
S' Lateral: 2.32 cm

## 2023-12-08 ENCOUNTER — Ambulatory Visit: Payer: Self-pay | Admitting: Cardiology

## 2023-12-08 ENCOUNTER — Ambulatory Visit: Payer: Self-pay | Admitting: Cardiovascular Disease

## 2023-12-08 LAB — LIPID PANEL
Chol/HDL Ratio: 3.8 ratio (ref 0.0–4.4)
Cholesterol, Total: 250 mg/dL — ABNORMAL HIGH (ref 100–199)
HDL: 65 mg/dL (ref >39)
LDL Chol Calc (NIH): 167 mg/dL — ABNORMAL HIGH (ref 0–99)
Triglycerides: 103 mg/dL (ref 0–149)
VLDL Cholesterol Cal: 18 mg/dL (ref 5–40)

## 2023-12-08 LAB — BASIC METABOLIC PANEL WITH GFR
BUN/Creatinine Ratio: 18 (ref 12–28)
BUN: 14 mg/dL (ref 8–27)
CO2: 24 mmol/L (ref 20–29)
Calcium: 9.2 mg/dL (ref 8.7–10.3)
Chloride: 99 mmol/L (ref 96–106)
Creatinine, Ser: 0.77 mg/dL (ref 0.57–1.00)
Glucose: 91 mg/dL (ref 70–99)
Potassium: 3.5 mmol/L (ref 3.5–5.2)
Sodium: 138 mmol/L (ref 134–144)
eGFR: 79 mL/min/1.73 (ref >59)

## 2023-12-08 MED ORDER — ATORVASTATIN CALCIUM 40 MG PO TABS: 40.0000 mg | ORAL_TABLET | Freq: Every day | ORAL | 3 refills | Status: DC

## 2023-12-08 NOTE — Telephone Encounter (Signed)
 Pt has two questions/ concerns following her echo   She states the tech told her while doing the echo  has anyone ever tell you you have a hiatus hernia. She asked what that is and is it concerning.  She also states she has been coughing, hurts to breathe sometimes, and her nose is always cold.  She states she has been googling her symptoms and asked if this could be related to her heart.

## 2023-12-09 DIAGNOSIS — J329 Chronic sinusitis, unspecified: Secondary | ICD-10-CM | POA: Diagnosis not present

## 2023-12-09 DIAGNOSIS — J84112 Idiopathic pulmonary fibrosis: Secondary | ICD-10-CM | POA: Diagnosis not present

## 2023-12-09 DIAGNOSIS — E039 Hypothyroidism, unspecified: Secondary | ICD-10-CM | POA: Diagnosis not present

## 2023-12-09 DIAGNOSIS — M1990 Unspecified osteoarthritis, unspecified site: Secondary | ICD-10-CM | POA: Diagnosis not present

## 2023-12-09 DIAGNOSIS — J3489 Other specified disorders of nose and nasal sinuses: Secondary | ICD-10-CM | POA: Diagnosis not present

## 2023-12-09 NOTE — Progress Notes (Signed)
 Responded in a patient message

## 2023-12-13 ENCOUNTER — Telehealth (INDEPENDENT_AMBULATORY_CARE_PROVIDER_SITE_OTHER): Admitting: Family Medicine

## 2023-12-13 DIAGNOSIS — R051 Acute cough: Secondary | ICD-10-CM

## 2023-12-13 DIAGNOSIS — J069 Acute upper respiratory infection, unspecified: Secondary | ICD-10-CM | POA: Diagnosis not present

## 2023-12-13 MED ORDER — AZELASTINE HCL 0.1 % NA SOLN: 1.0000 | Freq: Two times a day (BID) | NASAL | 12 refills | Status: DC

## 2023-12-13 MED ORDER — PROMETHAZINE-DM 6.25-15 MG/5ML PO SYRP
5.0000 mL | ORAL_SOLUTION | Freq: Four times a day (QID) | ORAL | 0 refills | Status: DC | PRN
Start: 2023-12-13 — End: 2024-01-11

## 2023-12-13 NOTE — Progress Notes (Signed)
 Subjective:    Patient ID: Alexandria [name], female    DOB: 20-Aug-1945, 78 y.o.   MRN: [id number]  No chief complaint on file.   HPI Patient is in today for sinus problems.   Discussed the use of AI scribe software for clinical note transcription with the patient, who gave verbal consent to proceed.  History of Present Illness Alexandria [name] is a 78 year old female who presents with persistent cough and nasal congestion.  She has a persistent cough that is severe enough to cause breathlessness and is accompanied by the production of white, viscous phlegm. The cough is debilitating at times. She uses a prescribed cough medication every six hours, which provides temporary relief.  She experiences significant nasal congestion without discharge, describing it as so obstructed that she cannot expel anything. She has been using Flonase  and a saline solution with sugar for two weeks without relief. She also takes Allegra once daily as an antihistamine.  She recently visited a new ENT who suggested antacid medications, which she took for ten days but discontinued due to feeling physically unwell.  She has been in contact with the [facility], providing her medical history for further evaluation. She spent about forty minutes on the phone detailing her medical history to them.  Her oxygen level is at 96%, confirmed by checking twice.  She gets her prescriptions filled at [facility] on [address] and notes that she has been receiving them for free recently.  Presence of white phlegm.   Past Medical History:  Diagnosis Date   Allergy     Arthritis    Blood transfusion without reported diagnosis    Bronchial pneumonia    Cataract    Chronic facial pain 08/14/2022   Colon polyp    Diverticulitis    DVT (deep venous thrombosis) (HCC)    lower extremity   H/O cardiac catheterization 2004   Normal coronary arteries   Heart murmur    History of stress test 05/21/2011   Hx of  echocardiogram 01/27/2010   Normal Ef 55% the transmitral spectral doppler flow pattern is normal for age. the left ventricular wall motion is normal   Hypothyroidism    IPF (idiopathic pulmonary fibrosis) (HCC) 01/2018   Myocardial infarction (HCC)    hx of 30 years ago   Pancreatitis    TIA (transient ischemic attack)    8 years ago    Past Surgical History:  Procedure Laterality Date   BREAST BIOPSY Left    [place]   CARDIAC CATHETERIZATION     10/2013   CATARACT EXTRACTION Bilateral 03/22/2018   FLEXIBLE SIGMOIDOSCOPY N/A 08/13/2022   Procedure: FLEXIBLE SIGMOIDOSCOPY;  Surgeon: Sheldon Standing, MD;  Location: [facility];  Service: General;  Laterality: N/A;   ILEOSTOMY CLOSURE N/A 11/19/2022   Procedure: OPEN TAKEDOWN OF LOOP ILEOSTOMY;  Surgeon: Sheldon Standing, MD;  Location: [facility];  Service: General;  Laterality: N/A;   LEFT HEART CATHETERIZATION WITH CORONARY ANGIOGRAM N/A 10/25/2013   Procedure: LEFT HEART CATHETERIZATION WITH CORONARY ANGIOGRAM;  Surgeon: Debby DELENA Sor, MD;  Location: [facility] CATH LAB;  Service: Cardiovascular;  Laterality: N/A;   LYSIS OF ADHESION N/A 08/13/2022   Procedure: LYSIS OF ADHESION, DIVERTING ILEOSTOMY;  Surgeon: Sheldon Standing, MD;  Location: [facility];  Service: General;  Laterality: N/A;   NECK SURGERY     ACDF x 2      done in [place] more than 25 years, doesn't know levels   RHINOPLASTY  08/16/2023  uses [place] in her nostril   TOTAL KNEE ARTHROPLASTY     Bilateral x's 2   VAGINAL HYSTERECTOMY  10/17/1998   [name]   VIDEO BRONCHOSCOPY Bilateral 01/24/2018   Procedure: VIDEO BRONCHOSCOPY WITH FLUORO;  Surgeon: Alaine Vicenta NOVAK, MD;  Location: [facility] ENDOSCOPY;  Service: Cardiopulmonary;  Laterality: Bilateral;   XI ROBOTIC ASSISTED COLOSTOMY TAKEDOWN N/A 08/13/2022   Procedure: ROBOTIC OSTOMY TAKEDOWN, SMALL BOWEL RESECTION X 2, BILATERAL TAP BLOCK, TISSUE PERFUSSION ASSESMENT VIA FIREFLY, MOBILAZATION OF SPLENIC FLEXURE;  Surgeon: Sheldon Standing,  MD;  Location: [facility];  Service: General;  Laterality: N/A;   XI ROBOTIC ASSISTED LOWER ANTERIOR RESECTION N/A 03/31/2022   Procedure: XI ROBOTIC ASSISTED LOWER ANTERIOR RESECTION;  Surgeon: Sheldon Standing, MD;  Location: [facility];  Service: General;  Laterality: N/A;    Family History  Problem Relation Age of Onset   Ovarian cancer Mother    Uterine cancer Mother    Lung cancer Father    HIV Brother        55   Kidney cancer Brother    Lung cancer Brother    Other Brother        Mouth Cancer   Colon cancer Maternal Aunt    Heart disease Maternal Grandmother    Stroke Maternal Grandmother    Hypertension Maternal Grandmother    Diabetes Maternal Grandmother    Arthritis Other    Esophageal cancer Neg Hx    Liver disease Neg Hx    Rectal cancer Neg Hx    Stomach cancer Neg Hx     Social History   Socioeconomic History   Marital status: Married    Spouse name: Alexandria [name]   Number of children: 3   Years of education: Jr college   Highest education level: Not on file  Occupational History   Occupation: Leisure centre manager: UNEMPLOYED   Occupation: retired  Tobacco Use   Smoking status: Never   Smokeless tobacco: Never  Vaping Use   Vaping status: Never Used  Substance and Sexual Activity   Alcohol  use: No    Alcohol /week: 0.0 standard drinks of alcohol    Drug use: No   Sexual activity: Not Currently  Other Topics Concern   Not on file  Social History Narrative   Lives with husband Alexandria [name]   Caffeine use: coffee (2 cups per day)   Mostly right-handed   Social Drivers of Corporate investment banker Strain: Not on file  Food Insecurity: Low Risk  (11/03/2023)   Received from [facility]   Hunger Vital Sign    Within the past 12 months, you worried that your food would run out before you got money to buy more: Never true    Within the past 12 months, the food you bought just didn't last and you didn't have money to get more. : Never true  Transportation Needs: No  Transportation Needs (11/03/2023)   Received from [facility]    In the past 12 months, has lack of reliable transportation kept you from medical appointments, meetings, work or from getting things needed for daily living? : No  Physical Activity: Not on file  Stress: Not on file  Social Connections: Unknown (08/25/2023)   Social Connection and Isolation Panel    Frequency of Communication with Friends and Family: Patient declined    Frequency of Social Gatherings with Friends and Family: Patient declined    Attends Religious Services: Patient declined    Active  Member of Clubs or Organizations: Patient declined    Attends Banker Meetings: Patient declined    Marital Status: Married  Catering manager Violence: Not At Risk (08/31/2023)   Humiliation, Afraid, Rape, and Kick questionnaire    Fear of Current or Ex-Partner: No    Emotionally Abused: No    Physically Abused: No    Sexually Abused: No    Outpatient Medications Prior to Visit  Medication Sig Dispense Refill   fexofenadine (ALLEGRA ALLERGY ) 180 MG tablet Take 1 tablet (180 mg total) by mouth daily.     amoxicillin -clavulanate (AUGMENTIN ) 875-125 MG tablet Take 1 tablet by mouth 2 (two) times daily. 20 tablet 0   atorvastatin  (LIPITOR) 40 MG tablet Take 1 tablet (40 mg total) by mouth daily. 90 tablet 3   benzonatate  (TESSALON ) 200 MG capsule Take 1 capsule (200 mg total) by mouth 3 (three) times daily as needed for cough. 20 capsule 3   colestipol  (COLESTID ) 1 g tablet Take 2 tablets (2 g total) by mouth 2 (two) times daily. 120 tablet 3   diphenoxylate -atropine  (LOMOTIL ) 2.5-0.025 MG tablet Take 2 tablets by mouth 3 (three) times daily as needed for diarrhea or loose stools.     furosemide  (LASIX ) 20 MG tablet Take 1 tablet (20 mg total) by mouth daily. (Patient taking differently: Take 10 mg by mouth daily as needed for edema or fluid.) 30 tablet 3   levalbuterol  (XOPENEX ) 1.25 MG/3ML nebulizer  solution Take 1.25 mg by nebulization every 6 (six) hours as needed for wheezing or shortness of breath. 150 mL 1   levothyroxine  (SYNTHROID ) 112 MCG tablet Take 1 tablet (112 mcg total) by mouth daily. 30 tablet 2   losartan  (COZAAR ) 25 MG tablet Take 1 tablet (25 mg total) by mouth daily. 90 tablet 3   magnesium  oxide (MAG-OX) 400 MG tablet Take 1 tablet (400 mg total) by mouth daily. 90 tablet 3   Multiple Vitamin (MULTIVITAMIN WITH MINERALS) TABS tablet Take 1 tablet by mouth once a week.     potassium chloride  (KLOR-CON  M) 20 MEQ tablet Take 1 tablet (20 mEq total) by mouth daily for 3 days. 3 tablet 0   Respiratory Therapy Supplies (FULL KIT NEBULIZER SET) MISC Use with DUONEB for COPD treatment 1 each 0   vitamin C  (ASCORBIC ACID ) 500 MG tablet Take 500 mg by mouth 2 (two) times a week.     promethazine -dextromethorphan (PROMETHAZINE -DM) 6.25-15 MG/5ML syrup Take 5 mLs by mouth 4 (four) times daily as needed. 118 mL 0   No facility-administered medications prior to visit.    Allergies  Allergen Reactions   Prednisone  Other (See Comments)    Was prescribed high doses at other times   Dilaudid  [Hydromorphone ] Itching and Other (See Comments)    Dilaudid  caused marked confusion   Ipratropium Bromide  Itching and Other (See Comments)    Mental Status Changes, Insomnia  Insomnia - Atrovent .   Atrovent  Hfa [Ipratropium Bromide  Hfa] Itching and Other (See Comments)    Pt could not sleep, Insomnia    Cheratussin Ac [Guaifenesin -Codeine ] Other (See Comments)    Headaches    Codeine  Itching, Nausea Only and Other (See Comments)    Pt takes promethazine  with codeine  at home   Diltiazem  Itching and Other (See Comments)    Makes patient sick. Shakes. GI   Hydrocodone Itching   Ibuprofen  Itching and Other (See Comments)    Gives false reading in blood   Influenza Vaccines Other (See Comments)  Pt reports heart attack after last flu shot   Morphine  Itching   Oxycodone  Itching     Review of Systems  Constitutional:  Negative for fever and malaise/fatigue.  HENT:  Positive for congestion and sinus pain.   Eyes:  Negative for blurred vision.  Respiratory:  Negative for shortness of breath.   Cardiovascular:  Negative for chest pain, palpitations and leg swelling.  Gastrointestinal:  Negative for abdominal pain, blood in stool and nausea.  Genitourinary:  Negative for dysuria and frequency.  Musculoskeletal:  Negative for falls.  Skin:  Negative for rash.  Neurological:  Negative for dizziness, loss of consciousness and headaches.  Endo/Heme/Allergies:  Negative for environmental allergies.  Psychiatric/Behavioral:  Negative for depression. The patient is not nervous/anxious.        Objective:    Physical Exam Vitals and nursing note reviewed.  HENT:     Nose: Congestion present.  Pulmonary:     Effort: Pulmonary effort is normal.     There were no vitals taken for this visit. Wt Readings from Last 3 Encounters:  11/01/23 128 lb (58.1 kg)  10/12/23 133 lb (60.3 kg)  09/26/23 127 lb (57.6 kg)    Diabetic Foot Exam - Simple   No data filed    Lab Results  Component Value Date   WBC 6.8 09/30/2023   HGB 14.3 09/30/2023   HCT 44.6 09/30/2023   PLT 247 09/30/2023   GLUCOSE 91 12/07/2023   CHOL 250 (H) 12/07/2023   TRIG 103 12/07/2023   HDL 65 12/07/2023   LDLCALC 167 (H) 12/07/2023   ALT 11 09/30/2023   AST 20 09/30/2023   NA 138 12/07/2023   K 3.5 12/07/2023   CL 99 12/07/2023   CREATININE 0.77 12/07/2023   BUN 14 12/07/2023   CO2 24 12/07/2023   TSH 0.32 (L) 09/09/2023   INR 1.2 03/30/2022   HGBA1C 5.4 04/05/2022    Lab Results  Component Value Date   TSH 0.32 (L) 09/09/2023   Lab Results  Component Value Date   WBC 6.8 09/30/2023   HGB 14.3 09/30/2023   HCT 44.6 09/30/2023   MCV 97.4 09/30/2023   PLT 247 09/30/2023   Lab Results  Component Value Date   NA 138 12/07/2023   K 3.5 12/07/2023   CO2 24 12/07/2023    GLUCOSE 91 12/07/2023   BUN 14 12/07/2023   CREATININE 0.77 12/07/2023   BILITOT 0.9 09/30/2023   ALKPHOS 64 09/30/2023   AST 20 09/30/2023   ALT 11 09/30/2023   PROT 7.4 09/30/2023   ALBUMIN  3.5 09/30/2023   CALCIUM  9.2 12/07/2023   ANIONGAP 9 09/30/2023   EGFR 79 12/07/2023   GFR 71.48 09/13/2023   Lab Results  Component Value Date   CHOL 250 (H) 12/07/2023   Lab Results  Component Value Date   HDL 65 12/07/2023   Lab Results  Component Value Date   LDLCALC 167 (H) 12/07/2023   Lab Results  Component Value Date   TRIG 103 12/07/2023   Lab Results  Component Value Date   CHOLHDL 3.8 12/07/2023   Lab Results  Component Value Date   HGBA1C 5.4 04/05/2022       Assessment and Plan Assessment & Plan Chronic cough with postnasal drip and phlegm   She experiences a chronic cough with white phlegm, worsened by postnasal drip, causing significant discomfort and disrupting daily activities. Current medications offer only temporary relief and do not address the underlying issue. The clear  phlegm suggests a non-bacterial cause, as antibiotics have been ineffective. She is awaiting further evaluation from the [facility]. Refill cough medication as needed every six hours and await further recommendations from the [facility].  Nasal congestion with postnasal drip   Persistent nasal congestion with postnasal drip contributes to her chronic cough. Current treatment with Flonase , saline with sugar, and Allegra is inadequate. Although the use of a neti pot was discussed, it was not tried due to sterility concerns, and her cardiologist advised against Nuvessa  for the same reason, even with boiled water . Prescribe Astelin  (azelastine ) nasal spray to be used with Flonase . Discuss the use of a neti pot for nasal irrigation, ensuring the use of sterile water .   Upper respiratory tract infection, unspecified type -     Azelastine  HCl; Place 1 spray into both nostrils 2 (two) times daily.  Use in each nostril as directed  Dispense: 30 mL; Refill: 12  Acute cough -     Promethazine -DM; Take 5 mLs by mouth 4 (four) times daily as needed.  Dispense: 118 mL; Refill: 0    [name] Antonio Cyndee, DO

## 2023-12-14 ENCOUNTER — Encounter: Payer: Self-pay | Admitting: Cardiovascular Disease

## 2023-12-14 DIAGNOSIS — M1990 Unspecified osteoarthritis, unspecified site: Secondary | ICD-10-CM | POA: Diagnosis not present

## 2023-12-14 DIAGNOSIS — J3489 Other specified disorders of nose and nasal sinuses: Secondary | ICD-10-CM | POA: Diagnosis not present

## 2023-12-14 DIAGNOSIS — J84112 Idiopathic pulmonary fibrosis: Secondary | ICD-10-CM | POA: Diagnosis not present

## 2023-12-14 DIAGNOSIS — E039 Hypothyroidism, unspecified: Secondary | ICD-10-CM | POA: Diagnosis not present

## 2023-12-14 DIAGNOSIS — J329 Chronic sinusitis, unspecified: Secondary | ICD-10-CM | POA: Diagnosis not present

## 2023-12-14 NOTE — Telephone Encounter (Signed)
 That would be a very unusual side effect for that medication.  My best guess is that it is a coincidence. Please stop the atorvastatin  until the vertigo resolves completely. I would wait at least 2 weeks. If necessary, see PCP or an ENT specialist for the vertigo.  Then once symptoms have resolved, start the atorvastatin  back.  If it happens again, we will abandon that medication, but I am very skeptical that there is any connection between the atorvastatin  and the vertigo.

## 2023-12-15 ENCOUNTER — Telehealth: Payer: Self-pay | Admitting: Internal Medicine

## 2023-12-15 NOTE — Telephone Encounter (Signed)
 Good Morning Dr Federico we received a call from patient requesting to have a MyChart video visit for tomorrows apt.   Patient feels she can not come into the office due to being on a new medication and having diarrhea. Please review and advise.

## 2023-12-16 ENCOUNTER — Telehealth: Admitting: Internal Medicine

## 2023-12-16 DIAGNOSIS — R053 Chronic cough: Secondary | ICD-10-CM

## 2023-12-16 MED ORDER — ESOMEPRAZOLE MAGNESIUM 40 MG PO CPDR: 40.0000 mg | DELAYED_RELEASE_CAPSULE | Freq: Two times a day (BID) | ORAL | 3 refills | Status: DC

## 2023-12-16 NOTE — Telephone Encounter (Signed)
 Apt changes to video visit. Patient advised.

## 2023-12-16 NOTE — Patient Instructions (Signed)
 GERD in Adults: Diet Changes When you have gastroesophageal reflux disease (GERD), you may need to make changes to your diet. Choosing the right foods can help with your symptoms. Think about working with an expert in healthy eating called a dietitian. They can help you make healthy food choices. What are tips for following this plan? Reading food labels Look for foods that are low in saturated fat. Foods that may help with your symptoms include: Foods with less than 5% of daily value (DV) of fat. Foods with 0 grams of trans fat. Cooking [employer] in ways that don't use a lot of fat. These ways include: Baking. Steaming. Grilling. Broiling. To add flavor, try to use herbs that are low in spice and acidity. Avoid frying your food. Meal planning  Eat small meals often rather than eating 3 large meals each day. Eat your meals slowly in a place where you feel relaxed. If told by your health care provider, avoid: Foods that cause symptoms. Keep a food diary to keep track of foods that cause symptoms. Alcohol. Drinking a lot of liquid with meals. General instructions For 2-3 hours after you eat, avoid: Bending over. Exercise. Lying down. Chew sugar-free gum after meals. What foods should I eat? Eat a healthy diet. Try to include: Foods with high amounts of fiber. These include: Fruits and vegetables. Whole grains and beans. Low-fat dairy products. Lean meats, fish, and poultry. Egg whites. Foods that cause symptoms in someone else may not cause symptoms for you. Work with your provider to find foods that are safe for you. The items listed above may not be all the foods and drinks you can have. Talk with a dietitian to learn more. The items listed above may not be a complete list of foods and beverages you can eat and drink. Contact a dietitian for more information. What foods should I avoid? Limiting some of these foods may help with your symptoms. Each person is different.  Talk with a dietitian or your provider to help you find the exact foods to avoid. Some of the foods to avoid may include: Fruits Fruits with a lot of acid in them. These may include citrus fruits, such as oranges, grapefruit, pineapple, and lemons. Vegetables Deep-fried vegetables, such as [place] fries. Vegetables, sauces, or toppings made with added fat and vegetables with acid in them. These may include tomatoes and tomato products, chili peppers, onions, garlic, and horseradish. Grains Pastries or quick breads with added fat. Meats and other proteins High-fat meats, such as fatty beef or pork, hot dogs, ribs, ham, sausage, salami, and bacon. Fried meat or protein, such as fried fish and fried chicken. Egg yolks. Fats and oils Butter. Margarine. Shortening. Ghee. Drinks Coffee and other drinks with caffeine in them. Fizzy and sugary drinks, such as soda and energy drinks. Fruit juice made with acidic fruits, such as orange or grapefruit. Tomato juice. Sweets and desserts Chocolate and cocoa. Donuts. Seasonings and condiments Mint, such as peppermint and spearmint. Condiments, herbs, or seasonings that cause symptoms. These may include curry, hot sauce, or vinegar-based salad dressings. The items listed above may not be all the foods and drinks you should avoid. Talk with a dietitian to learn more. Questions to ask your health care provider Changes to your diet and everyday life are often the first steps taken to manage symptoms of GERD. If these changes don't help, talk with your provider about taking medicines. Where to find more information [employer] for Gastrointestinal [employer]:  aboutgerd.org This information is not intended to replace advice given to you by your health care provider. Make sure you discuss any questions you have with your health care provider. Document Revised: 02/02/2023 Document Reviewed: 08/19/2022 Elsevier Patient Education  2024 [employer].

## 2023-12-16 NOTE — Progress Notes (Signed)
 12/16/2023 Alexandria [name] [id number] 1946/02/14  Referring provider: Antonio [name], Alexandria [name], *  ASSESSMENT AND PLAN:  Cough Sputum production Postnasal drip SOB Patient is currently primarily preoccupied with her cough with clear sputum production that persists throughout the day. She does have history of COPD/pulmonary fibrosis and is currently following with pulmonology for this issue. Based upon her symptoms, I am not necessarily convinced that this is due to acid reflux but will maximize acid reflux to see if this helps with her symptoms. She has previously not responded to pantoprazole  or omeprazole . Will also refer to allergy  in case she benefits from allergy  medications and/or testing. - GERD handout - Start Nexium  40 mg BID - Referral to allergy  - Already follows with ENT and pulmonology - RTC 2 months  Diverticulitis with abscess s/p history of robotic drainage pelvic abscess and Hartman rectosigmoid resection/colostomy 03/2022 with Dr. Sheldon subsequent diverting loop ileostomy takedown 11/19/2022 10/31/2021 colonoscopy chronic colitis sigmoid colon concerning for IBD 03/20/2022 flexible sigmoidoscopy for abnormal CT with rectal thickening congested mucosa sigmoid descending splenic flexure, diverticulosis sigmoid colon, partially obstructing tumor rectosigmoid colon, nonbleeding internal hemorrhoids, anal fissure pathology showed inflammatory polyp with possible ischemia 03/2022 MR pelvis showed 3.6 cm multiloculated fluid collection left perirectal and presacral region favoring diverticulitis and diverticular abscess 03/31/2022 status post robotic lower anterior rectosigmoid resection with colostomy drainage of pelvic abscess with Dr. Sheldon 08/13/2022 colostomy for colon resection and ostomy takedown with Dr. Sheldon with small bowel resection 11/19/2022 for diverting loop ileostomy takedown Patient has had improvement in her bowel habits recently. In 11/2023, she had a CT A/P  w/contrast that showed mild wall thickening of the transverse and descending colon with fluid filled sigmoid colon and rectum. I offered the patient a repeat colonoscopy procedure to assess the thickening in the colon but patient declined at this time. -Declined colonoscopy until she gets her cough issues resolved  COPD with idiopathic pulmonary fibrosis Follows with [facility] pulmonology. Does not want to go back to [facility] where she followed previously. Contacted [facility] Not on oxygen  CAD  CVA/TIA   Patient Care Team: Alexandria [name], Alexandria SAUNDERS, DO as PCP - General (Family Medicine) Croitoru, Jerel, MD as PCP - Cardiology (Cardiology) Tobb, Kardie, DO as Consulting Physician (Cardiology) Alexandria Rosario BROCKS, MD as Consulting Physician (Gastroenterology) Alexandria Standing, MD as Consulting Physician (General Surgery) Alexandria Johana Jansky, MD (Otolaryngology) Alexandria Jerel, MD as Consulting Physician (Cardiology) Alexandria Evalene LABOR, MD as Consulting Physician (Obstetrics and Gynecology)  HISTORY OF PRESENT ILLNESS: 78 y.o. female with a past medical history listed below presents for evaluation of blood in stool.   Last seen 03/2022 by Dr. Federico in our office for abnormal CT scan with thickening in the rectum.  Found to have perforated diverticulitis with partially contained abscess status post takedown of diverting loop ileostomy after urgent Hartmann resection for chronic colonic obstruction with prolonged hospital recovery.  Last seen by Dr. Sheldon 05/31/2023 where patient had unremarkable CT abdomen pelvis, possibility of lower anterior resection syndrome with presentation on fiber.  Interval History: She has been having a bad cough with phlegm for the last 5 months. She did not find the pantoprazole  and omeprazole  helpful for controlling the symptoms. She tried both for 6 weeks or longer. The coughing with phlegm starts in the morning and continues throughout the day. This does not seem to worsen  with food intake. This has been extremely bothersome to her, affecting her daily life. She has occasional  chest burning. Endorses SOB. Feels like she has postnasal drip. Her left nostril is completely stuffed. She previously had revision functional open septorhinoplasty in 08/2023 and states that her symptoms seemed to occur after this surgery. She has seen pulmonology and ENT. ENT gave her prednisone  for her cough. She has having regular BMs. Denies ab pain. She is not having trouble swallowing.  RELEVANT GI HISTORY, IMAGING AND LABS: CT A/P w/contrast 09/30/23: IMPRESSION: Stable examination. No evidence of acute process.   CT A/P w/contrast 11/10/23: IMPRESSION:  1. Mild wall thickening of the transverse and descending colon with fluid-filled sigmoid colon and rectum, possible mild colitis.  2.  Small fluid collection posterior to the ascending colon, lateral to the right psoas muscle measuring 1.5 x 1.9 x 2.7 cm. No surrounding inflammatory change to suggest abscess. This may represent a small postoperative seroma or lymphocele. Prior CT from [facility] has been requested for comparison.  3.  Coarse interstitial lung disease with bronchiectasis and basilar reticular opacities.   Colonoscopy 10/31/21: BPPS 6.   Flex sig 03/20/22: - An area of congested mucosa was found in the sigmoid colon, in the descending colon and at the splenic flexure. This was biopsied with a cold forceps for histology. - Multiple diverticula were found in the sigmoid colon and descending colon. - A polypoid partially obstructing large mass was found in the recto- sigmoid colon. The mass measured one cm in length. In addition, its diameter measured fourteen mm. No bleeding was present. Biopsies were taken with a cold forceps for histology. - Non- bleeding internal hemorrhoids were found during retroflexion. - An anal fissure was found on perianal exam. Path: 1. Left Colon Biopsy - PATCHY MILD ACUTE COLITIS WITH DEGENERATIVE  GLANDULAR CHANGES AND HYALINIZATION OF LAMINA PROPRIA, SEE NOTE 2. Rectum, biopsy - POLYPOID COLONIC MUCOSA SHOWING NONSPECIFIC ARCHITECTURAL AND INFLAMMATORY CHANGES, SUGGESTIVE OF AN INFLAMMATORY POLYP - NEGATIVE FOR DYSPLASIA OR MALIGNANCY IN THE SUBMITTED BIOPSIES Diagnosis Note 1. The findings appear suggestive of ischemic colitis. Differential diagnosis can include drug-effect.  EGD 10/12/23: - Normal esophagus. - Gastritis. Biopsied. - Normal examined duodenum. Biopsied. Path: 1. Surgical [P], duodenal biopsy :       - SMALL BOWEL MUCOSA WITH NO SPECIFIC PATHOLOGIC CHANGE.       - NEGATIVE FOR INTRAEPITHELIAL LYMPHOCYTOSIS OR VILLOUS BLUNTING.       2. Surgical [P], gastric antrum and gastric body :       - BENIGN GASTRIC MUCOSA WITH NO SPECIFIC PATHOLOGIC CHANGE.       - NO EVIDENCE OF H. PYLORI ON H&E STAIN.   CBC    Component Value Date/Time   WBC 6.8 09/30/2023 1316   RBC 4.58 09/30/2023 1316   HGB 14.3 09/30/2023 1316   HGB 13.6 06/23/2023 1653   HCT 44.6 09/30/2023 1316   HCT 40.2 06/23/2023 1653   PLT 247 09/30/2023 1316   PLT 208 06/23/2023 1653   MCV 97.4 09/30/2023 1316   MCV 94 06/23/2023 1653   MCH 31.2 09/30/2023 1316   MCHC 32.1 09/30/2023 1316   RDW 13.8 09/30/2023 1316   RDW 12.5 06/23/2023 1653   LYMPHSABS 1.2 09/26/2023 2214   LYMPHSABS 1.6 03/21/2021 1214   MONOABS 0.8 09/26/2023 2214   EOSABS 0.2 09/26/2023 2214   EOSABS 0.5 (H) 03/21/2021 1214   BASOSABS 0.1 09/26/2023 2214   BASOSABS 0.1 03/21/2021 1214   Recent Labs    08/05/23 1510 08/25/23 0644 08/26/23 0448 08/27/23 0412 09/03/23 1422 09/09/23 1330 09/13/23  1429 09/26/23 2214 09/26/23 2219 09/30/23 1316  HGB 13.9 12.3 11.7* 13.9 14.0 12.7 13.4 13.7 13.6 14.3    CMP     Component Value Date/Time   NA 138 12/07/2023 1205   K 3.5 12/07/2023 1205   CL 99 12/07/2023 1205   CO2 24 12/07/2023 1205   GLUCOSE 91 12/07/2023 1205   GLUCOSE 102 (H) 09/30/2023 1316   BUN 14  12/07/2023 1205   CREATININE 0.77 12/07/2023 1205   CREATININE 0.80 04/23/2023 1343   CALCIUM  9.2 12/07/2023 1205   PROT 7.4 09/30/2023 1316   ALBUMIN  3.5 09/30/2023 1316   AST 20 09/30/2023 1316   ALT 11 09/30/2023 1316   ALKPHOS 64 09/30/2023 1316   BILITOT 0.9 09/30/2023 1316   GFRNONAA >60 09/30/2023 1316   GFRAA 85 05/24/2020 1006      Latest Ref Rng & Units 09/30/2023    1:16 PM 09/26/2023   10:14 PM 09/13/2023    2:29 PM  Hepatic Function  Total Protein 6.5 - 8.1 g/dL 7.4  7.4  8.1   Albumin  3.5 - 5.0 g/dL 3.5  3.9  4.2   AST 15 - 41 U/L 20  26  16    ALT 0 - 44 U/L 11  10  10    Alk Phosphatase 38 - 126 U/L 64  84  87   Total Bilirubin 0.0 - 1.2 mg/dL 0.9  0.5  0.6       Current Medications:   Current Outpatient Medications (Endocrine & Metabolic):    levothyroxine  (SYNTHROID ) 112 MCG tablet, Take 1 tablet (112 mcg total) by mouth daily.  Current Outpatient Medications (Cardiovascular):    atorvastatin  (LIPITOR) 40 MG tablet, Take 1 tablet (40 mg total) by mouth daily.   colestipol  (COLESTID ) 1 g tablet, Take 2 tablets (2 g total) by mouth 2 (two) times daily.   furosemide  (LASIX ) 20 MG tablet, Take 1 tablet (20 mg total) by mouth daily. (Patient taking differently: Take 10 mg by mouth daily as needed for edema or fluid.)   losartan  (COZAAR ) 25 MG tablet, Take 1 tablet (25 mg total) by mouth daily.  Current Outpatient Medications (Respiratory):    azelastine  (ASTELIN ) 0.1 % nasal spray, Place 1 spray into both nostrils 2 (two) times daily. Use in each nostril as directed   benzonatate  (TESSALON ) 200 MG capsule, Take 1 capsule (200 mg total) by mouth 3 (three) times daily as needed for cough.   fexofenadine (ALLEGRA ALLERGY ) 180 MG tablet, Take 1 tablet (180 mg total) by mouth daily.   levalbuterol  (XOPENEX ) 1.25 MG/3ML nebulizer solution, Take 1.25 mg by nebulization every 6 (six) hours as needed for wheezing or shortness of breath.   promethazine -dextromethorphan  (PROMETHAZINE -DM) 6.25-15 MG/5ML syrup, Take 5 mLs by mouth 4 (four) times daily as needed.    Current Outpatient Medications (Other):    amoxicillin -clavulanate (AUGMENTIN ) 875-125 MG tablet, Take 1 tablet by mouth 2 (two) times daily.   diphenoxylate -atropine  (LOMOTIL ) 2.5-0.025 MG tablet, Take 2 tablets by mouth 3 (three) times daily as needed for diarrhea or loose stools.   magnesium  oxide (MAG-OX) 400 MG tablet, Take 1 tablet (400 mg total) by mouth daily.   Multiple Vitamin (MULTIVITAMIN WITH MINERALS) TABS tablet, Take 1 tablet by mouth once a week.   potassium chloride  (KLOR-CON  M) 20 MEQ tablet, Take 1 tablet (20 mEq total) by mouth daily for 3 days.   Respiratory Therapy Supplies (FULL KIT NEBULIZER SET) MISC, Use with DUONEB for COPD treatment   vitamin  C (ASCORBIC ACID ) 500 MG tablet, Take 500 mg by mouth 2 (two) times a week.  Medical History:  Past Medical History:  Diagnosis Date   Allergy     Arthritis    Blood transfusion without reported diagnosis    Bronchial pneumonia    Cataract    Chronic facial pain 08/14/2022   Colon polyp    Diverticulitis    DVT (deep venous thrombosis) (HCC)    lower extremity   H/O cardiac catheterization 2004   Normal coronary arteries   Heart murmur    History of stress test 05/21/2011   Hx of echocardiogram 01/27/2010   Normal Ef 55% the transmitral spectral doppler flow pattern is normal for age. the left ventricular wall motion is normal   Hypothyroidism    IPF (idiopathic pulmonary fibrosis) (HCC) 01/2018   Myocardial infarction (HCC)    hx of 30 years ago   Pancreatitis    TIA (transient ischemic attack)    8 years ago   Allergies:  Allergies  Allergen Reactions   Prednisone  Other (See Comments)    Was prescribed high doses at other times   Dilaudid  [Hydromorphone ] Itching and Other (See Comments)    Dilaudid  caused marked confusion   Ipratropium Bromide  Itching and Other (See Comments)    Mental Status Changes,  Insomnia  Insomnia - Atrovent .   Atrovent  Hfa [Ipratropium Bromide  Hfa] Itching and Other (See Comments)    Pt could not sleep, Insomnia    Cheratussin Ac [Guaifenesin -Codeine ] Other (See Comments)    Headaches    Codeine  Itching, Nausea Only and Other (See Comments)    Pt takes promethazine  with codeine  at home   Diltiazem  Itching and Other (See Comments)    Makes patient sick. Shakes. GI   Hydrocodone Itching   Ibuprofen  Itching and Other (See Comments)    Gives false reading in blood   Influenza Vaccines Other (See Comments)    Pt reports heart attack after last flu shot   Morphine  Itching   Oxycodone  Itching     Surgical History:  She  has a past surgical history that includes Vaginal hysterectomy (10/17/1998); Breast biopsy (Left); Total knee arthroplasty; Neck surgery; Cardiac catheterization; left heart catheterization with coronary angiogram (N/A, 10/25/2013); Video bronchoscopy (Bilateral, 01/24/2018); Cataract extraction (Bilateral, 03/22/2018); XI robotic assisted lower anterior resection (N/A, 03/31/2022); Xi robotic assisted colostomy takedown (N/A, 08/13/2022); Lysis of adhesion (N/A, 08/13/2022); Flexible sigmoidoscopy (N/A, 08/13/2022); Ileostomy closure (N/A, 11/19/2022); and Rhinoplasty (08/16/2023). Family History:  Her family history includes Arthritis in an other family member; Colon cancer in her maternal aunt; Diabetes in her maternal grandmother; HIV in her brother; Heart disease in her maternal grandmother; Hypertension in her maternal grandmother; Kidney cancer in her brother; Lung cancer in her brother and father; Other in her brother; Ovarian cancer in her mother; Stroke in her maternal grandmother; Uterine cancer in her mother.   PHYSICAL EXAM: Not performed as part of televisit  Rosario JAYSON Kidney, MD 9:15 AM  I connected with  Alexandria [name] on 12/16/23 by a video enabled telemedicine application and verified that I am speaking with the correct  person using two identifiers. She is located at home and I am located in my office.   I discussed the limitations of evaluation and management by telemedicine. The patient expressed understanding and agreed to proceed.  I spent 42 minutes of time, including in depth chart review, independent review of results as outlined above, communicating results with the patient directly, face-to-face time with  the patient, coordinating care, ordering studies and medications as appropriate, and documentation.

## 2023-12-17 ENCOUNTER — Telehealth: Payer: Self-pay

## 2023-12-17 DIAGNOSIS — R053 Chronic cough: Secondary | ICD-10-CM

## 2023-12-17 NOTE — Telephone Encounter (Signed)
 Referral to Allergy placed.

## 2023-12-21 DIAGNOSIS — M1990 Unspecified osteoarthritis, unspecified site: Secondary | ICD-10-CM | POA: Diagnosis not present

## 2023-12-21 DIAGNOSIS — J84112 Idiopathic pulmonary fibrosis: Secondary | ICD-10-CM | POA: Diagnosis not present

## 2023-12-21 DIAGNOSIS — J3489 Other specified disorders of nose and nasal sinuses: Secondary | ICD-10-CM | POA: Diagnosis not present

## 2023-12-21 DIAGNOSIS — J329 Chronic sinusitis, unspecified: Secondary | ICD-10-CM | POA: Diagnosis not present

## 2023-12-21 DIAGNOSIS — E039 Hypothyroidism, unspecified: Secondary | ICD-10-CM | POA: Diagnosis not present

## 2023-12-27 DIAGNOSIS — L821 Other seborrheic keratosis: Secondary | ICD-10-CM | POA: Diagnosis not present

## 2023-12-27 DIAGNOSIS — L570 Actinic keratosis: Secondary | ICD-10-CM | POA: Diagnosis not present

## 2023-12-27 DIAGNOSIS — Z85828 Personal history of other malignant neoplasm of skin: Secondary | ICD-10-CM | POA: Diagnosis not present

## 2023-12-27 DIAGNOSIS — Z129 Encounter for screening for malignant neoplasm, site unspecified: Secondary | ICD-10-CM | POA: Diagnosis not present

## 2023-12-28 ENCOUNTER — Telehealth: Payer: Self-pay | Admitting: Neurology

## 2023-12-28 NOTE — Telephone Encounter (Signed)
 Copied from CRM [id number]. Topic: General - Other >> Dec 27, 2023  4:04 PM Dedra B wrote: Reason for CRM: Manuelita from [facility] needs a better diagnosis code for pt. She would like the pt's last two office visit summaries faxed over to [phone]. If any questions, call Manuelita at [phone].

## 2023-12-29 NOTE — Telephone Encounter (Signed)
 Last 2 OV notes faxed to [facility] as requested.

## 2023-12-30 DIAGNOSIS — J329 Chronic sinusitis, unspecified: Secondary | ICD-10-CM | POA: Diagnosis not present

## 2023-12-30 DIAGNOSIS — E039 Hypothyroidism, unspecified: Secondary | ICD-10-CM | POA: Diagnosis not present

## 2023-12-30 DIAGNOSIS — M1990 Unspecified osteoarthritis, unspecified site: Secondary | ICD-10-CM | POA: Diagnosis not present

## 2023-12-30 DIAGNOSIS — J84112 Idiopathic pulmonary fibrosis: Secondary | ICD-10-CM | POA: Diagnosis not present

## 2023-12-30 DIAGNOSIS — J3489 Other specified disorders of nose and nasal sinuses: Secondary | ICD-10-CM | POA: Diagnosis not present

## 2024-01-02 DIAGNOSIS — J329 Chronic sinusitis, unspecified: Secondary | ICD-10-CM | POA: Diagnosis not present

## 2024-01-02 DIAGNOSIS — J3489 Other specified disorders of nose and nasal sinuses: Secondary | ICD-10-CM | POA: Diagnosis not present

## 2024-01-02 DIAGNOSIS — J84112 Idiopathic pulmonary fibrosis: Secondary | ICD-10-CM | POA: Diagnosis not present

## 2024-01-02 DIAGNOSIS — E039 Hypothyroidism, unspecified: Secondary | ICD-10-CM | POA: Diagnosis not present

## 2024-01-02 DIAGNOSIS — M1990 Unspecified osteoarthritis, unspecified site: Secondary | ICD-10-CM | POA: Diagnosis not present

## 2024-01-03 ENCOUNTER — Other Ambulatory Visit: Payer: Self-pay | Admitting: Family Medicine

## 2024-01-03 DIAGNOSIS — E039 Hypothyroidism, unspecified: Secondary | ICD-10-CM

## 2024-01-03 MED ORDER — ROSUVASTATIN CALCIUM 20 MG PO TABS: 20.0000 mg | ORAL_TABLET | Freq: Every day | ORAL | 4 refills | Status: DC

## 2024-01-03 NOTE — Telephone Encounter (Signed)
 Stop atorvastatin , start rosuvastatin 20 mg daily please

## 2024-01-05 ENCOUNTER — Telehealth: Payer: Self-pay | Admitting: Internal Medicine

## 2024-01-05 DIAGNOSIS — J309 Allergic rhinitis, unspecified: Secondary | ICD-10-CM | POA: Diagnosis not present

## 2024-01-05 NOTE — Telephone Encounter (Signed)
 Inbound call from patient stating she does not believe Nexium  is working for her. States she is still having a lot of acid and coughing. Please advise, thank you

## 2024-01-06 DIAGNOSIS — J329 Chronic sinusitis, unspecified: Secondary | ICD-10-CM | POA: Diagnosis not present

## 2024-01-06 DIAGNOSIS — J3489 Other specified disorders of nose and nasal sinuses: Secondary | ICD-10-CM | POA: Diagnosis not present

## 2024-01-06 DIAGNOSIS — J84112 Idiopathic pulmonary fibrosis: Secondary | ICD-10-CM | POA: Diagnosis not present

## 2024-01-06 DIAGNOSIS — M1990 Unspecified osteoarthritis, unspecified site: Secondary | ICD-10-CM | POA: Diagnosis not present

## 2024-01-06 DIAGNOSIS — E039 Hypothyroidism, unspecified: Secondary | ICD-10-CM | POA: Diagnosis not present

## 2024-01-06 MED ORDER — ESOMEPRAZOLE MAGNESIUM 40 MG PO CPDR: 40.0000 mg | DELAYED_RELEASE_CAPSULE | Freq: Two times a day (BID) | ORAL | 3 refills | Status: DC

## 2024-01-06 NOTE — Telephone Encounter (Signed)
 Inbound call from patient stating she called [facility] and they stated they did not have prescription of medication nexium  40mg . Patient requesting medication to be sent to same pharmacy. Please advise  Thank you

## 2024-01-06 NOTE — Addendum Note (Signed)
 Addended by: Eliott Amparan K on: 01/06/2024 01:49 PM   Modules accepted: Orders

## 2024-01-06 NOTE — Telephone Encounter (Signed)
 Spoke with patient & she was seen by Allergy  yesterday who advised she return to our office to discuss ongoing reflux symptoms. She is still having reflux & cough (clear, thick). She has not started nexium  40 mg BID that was prescribed to her by Dr. Federico on 12/16/23. Advised she start this & try for 1-2 weeks. If not any better then to give us  a call. Pt verbalized all understanding.

## 2024-01-10 ENCOUNTER — Other Ambulatory Visit: Payer: Self-pay | Admitting: Family Medicine

## 2024-01-10 MED ORDER — ONDANSETRON HCL 4 MG PO TABS: 4.0000 mg | ORAL_TABLET | Freq: Three times a day (TID) | ORAL | 1 refills | Status: DC | PRN

## 2024-01-10 NOTE — Telephone Encounter (Signed)
 Copied from CRM #[id number]. Topic: Clinical - Medication Refill >> Jan 10, 2024 12:17 PM Pinkey ORN wrote: Medication: ondansetron  (ZOFRAN ) tablet 4 mg  Has the patient contacted their pharmacy? No (Agent: If no, request that the patient contact the pharmacy for the refill. If patient does not wish to contact the pharmacy document the reason why and proceed with request.) (Agent: If yes, when and what did the pharmacy advise?)  This is the patient's preferred pharmacy:  [id number] - [place], [place] - [address] AT [address]OSS [address] [place] [address] Phone: [phone] Fax: [phone]  Is this the correct pharmacy for this prescription? Yes If no, delete pharmacy and type the correct one.   Has the prescription been filled recently? No  Is the patient out of the medication? Yes  Has the patient been seen for an appointment in the last year OR does the patient have an upcoming appointment? Yes  Can we respond through MyChart? Yes  Agent: Please be advised that Rx refills may take up to 3 business days. We ask that you follow-up with your pharmacy.

## 2024-01-11 ENCOUNTER — Other Ambulatory Visit: Payer: Self-pay | Admitting: Family Medicine

## 2024-01-11 DIAGNOSIS — R051 Acute cough: Secondary | ICD-10-CM

## 2024-01-20 ENCOUNTER — Ambulatory Visit: Admitting: Allergy & Immunology

## 2024-01-24 ENCOUNTER — Inpatient Hospital Stay (HOSPITAL_BASED_OUTPATIENT_CLINIC_OR_DEPARTMENT_OTHER)
Admission: EM | Admit: 2024-01-24 | Discharge: 2024-01-30 | DRG: 291 | Disposition: A | Discharge status: Home Health | Attending: Internal Medicine | Admitting: Internal Medicine

## 2024-01-24 ENCOUNTER — Encounter (HOSPITAL_COMMUNITY): Payer: Self-pay | Admitting: Family Medicine

## 2024-01-24 ENCOUNTER — Ambulatory Visit: Admitting: Family Medicine

## 2024-01-24 ENCOUNTER — Emergency Department (HOSPITAL_BASED_OUTPATIENT_CLINIC_OR_DEPARTMENT_OTHER)

## 2024-01-24 ENCOUNTER — Telehealth: Payer: Self-pay | Admitting: Family Medicine

## 2024-01-24 ENCOUNTER — Other Ambulatory Visit: Payer: Self-pay

## 2024-01-24 DIAGNOSIS — J84112 Idiopathic pulmonary fibrosis: Secondary | ICD-10-CM | POA: Diagnosis present

## 2024-01-24 DIAGNOSIS — Z79890 Hormone replacement therapy (postmenopausal): Secondary | ICD-10-CM | POA: Diagnosis not present

## 2024-01-24 DIAGNOSIS — Z86718 Personal history of other venous thrombosis and embolism: Secondary | ICD-10-CM

## 2024-01-24 DIAGNOSIS — E785 Hyperlipidemia, unspecified: Secondary | ICD-10-CM | POA: Diagnosis present

## 2024-01-24 DIAGNOSIS — Z9842 Cataract extraction status, left eye: Secondary | ICD-10-CM

## 2024-01-24 DIAGNOSIS — Z79899 Other long term (current) drug therapy: Secondary | ICD-10-CM

## 2024-01-24 DIAGNOSIS — Z8673 Personal history of transient ischemic attack (TIA), and cerebral infarction without residual deficits: Secondary | ICD-10-CM | POA: Diagnosis not present

## 2024-01-24 DIAGNOSIS — Z1152 Encounter for screening for COVID-19: Secondary | ICD-10-CM | POA: Diagnosis not present

## 2024-01-24 DIAGNOSIS — R0989 Other specified symptoms and signs involving the circulatory and respiratory systems: Secondary | ICD-10-CM | POA: Diagnosis not present

## 2024-01-24 DIAGNOSIS — I5031 Acute diastolic (congestive) heart failure: Secondary | ICD-10-CM | POA: Diagnosis not present

## 2024-01-24 DIAGNOSIS — Z887 Allergy status to serum and vaccine status: Secondary | ICD-10-CM | POA: Diagnosis not present

## 2024-01-24 DIAGNOSIS — I351 Nonrheumatic aortic (valve) insufficiency: Secondary | ICD-10-CM | POA: Diagnosis present

## 2024-01-24 DIAGNOSIS — J4520 Mild intermittent asthma, uncomplicated: Secondary | ICD-10-CM | POA: Diagnosis present

## 2024-01-24 DIAGNOSIS — I5033 Acute on chronic diastolic (congestive) heart failure: Secondary | ICD-10-CM | POA: Diagnosis present

## 2024-01-24 DIAGNOSIS — I272 Pulmonary hypertension, unspecified: Secondary | ICD-10-CM | POA: Diagnosis present

## 2024-01-24 DIAGNOSIS — Z885 Allergy status to narcotic agent status: Secondary | ICD-10-CM

## 2024-01-24 DIAGNOSIS — Z888 Allergy status to other drugs, medicaments and biological substances status: Secondary | ICD-10-CM | POA: Diagnosis not present

## 2024-01-24 DIAGNOSIS — I7121 Aneurysm of the ascending aorta, without rupture: Secondary | ICD-10-CM | POA: Diagnosis not present

## 2024-01-24 DIAGNOSIS — I10 Essential (primary) hypertension: Secondary | ICD-10-CM | POA: Diagnosis not present

## 2024-01-24 DIAGNOSIS — Z9841 Cataract extraction status, right eye: Secondary | ICD-10-CM

## 2024-01-24 DIAGNOSIS — J9601 Acute respiratory failure with hypoxia: Principal | ICD-10-CM | POA: Diagnosis present

## 2024-01-24 DIAGNOSIS — Z823 Family history of stroke: Secondary | ICD-10-CM

## 2024-01-24 DIAGNOSIS — Z8261 Family history of arthritis: Secondary | ICD-10-CM | POA: Diagnosis not present

## 2024-01-24 DIAGNOSIS — R079 Chest pain, unspecified: Secondary | ICD-10-CM | POA: Diagnosis not present

## 2024-01-24 DIAGNOSIS — Z96652 Presence of left artificial knee joint: Secondary | ICD-10-CM | POA: Diagnosis present

## 2024-01-24 DIAGNOSIS — J849 Interstitial pulmonary disease, unspecified: Secondary | ICD-10-CM | POA: Diagnosis not present

## 2024-01-24 DIAGNOSIS — I700 Atherosclerosis of aorta: Secondary | ICD-10-CM | POA: Diagnosis not present

## 2024-01-24 DIAGNOSIS — F419 Anxiety disorder, unspecified: Secondary | ICD-10-CM | POA: Diagnosis present

## 2024-01-24 DIAGNOSIS — I110 Hypertensive heart disease with heart failure: Secondary | ICD-10-CM | POA: Diagnosis not present

## 2024-01-24 DIAGNOSIS — K219 Gastro-esophageal reflux disease without esophagitis: Secondary | ICD-10-CM | POA: Diagnosis present

## 2024-01-24 DIAGNOSIS — Z833 Family history of diabetes mellitus: Secondary | ICD-10-CM | POA: Diagnosis not present

## 2024-01-24 DIAGNOSIS — K589 Irritable bowel syndrome without diarrhea: Secondary | ICD-10-CM | POA: Diagnosis present

## 2024-01-24 DIAGNOSIS — E039 Hypothyroidism, unspecified: Secondary | ICD-10-CM | POA: Diagnosis not present

## 2024-01-24 DIAGNOSIS — I509 Heart failure, unspecified: Secondary | ICD-10-CM | POA: Diagnosis not present

## 2024-01-24 DIAGNOSIS — J8410 Pulmonary fibrosis, unspecified: Secondary | ICD-10-CM | POA: Diagnosis not present

## 2024-01-24 DIAGNOSIS — R918 Other nonspecific abnormal finding of lung field: Secondary | ICD-10-CM | POA: Diagnosis not present

## 2024-01-24 DIAGNOSIS — Z8249 Family history of ischemic heart disease and other diseases of the circulatory system: Secondary | ICD-10-CM | POA: Diagnosis not present

## 2024-01-24 LAB — RESP PANEL BY RT-PCR (RSV, FLU A&B, COVID)  RVPGX2
Influenza A by PCR: NEGATIVE
Influenza B by PCR: NEGATIVE
Resp Syncytial Virus by PCR: NEGATIVE
SARS Coronavirus 2 by RT PCR: NEGATIVE

## 2024-01-24 LAB — CBC WITH DIFFERENTIAL/PLATELET
Abs Immature Granulocytes: 0.04 K/uL (ref 0.00–0.07)
Basophils Absolute: 0.1 K/uL (ref 0.0–0.1)
Basophils Relative: 2 %
Eosinophils Absolute: 0.5 K/uL (ref 0.0–0.5)
Eosinophils Relative: 6 %
HCT: 43.6 % (ref 36.0–46.0)
Hemoglobin: 14.3 g/dL (ref 12.0–15.0)
Immature Granulocytes: 1 %
Lymphocytes Relative: 18 %
Lymphs Abs: 1.3 K/uL (ref 0.7–4.0)
MCH: 31.8 pg (ref 26.0–34.0)
MCHC: 32.8 g/dL (ref 30.0–36.0)
MCV: 96.9 fL (ref 80.0–100.0)
Monocytes Absolute: 1 K/uL (ref 0.1–1.0)
Monocytes Relative: 14 %
Neutro Abs: 4.2 K/uL (ref 1.7–7.7)
Neutrophils Relative %: 59 %
Platelets: 269 K/uL (ref 150–400)
RBC: 4.5 MIL/uL (ref 3.87–5.11)
RDW: 14.8 % (ref 11.5–15.5)
WBC: 7.1 K/uL (ref 4.0–10.5)
nRBC: 0 % (ref 0.0–0.2)

## 2024-01-24 LAB — COMPREHENSIVE METABOLIC PANEL WITH GFR
ALT: 6 U/L (ref 0–44)
AST: 22 U/L (ref 15–41)
Albumin: 4.1 g/dL (ref 3.5–5.0)
Alkaline Phosphatase: 96 U/L (ref 38–126)
Anion gap: 12 (ref 5–15)
BUN: 11 mg/dL (ref 8–23)
CO2: 27 mmol/L (ref 22–32)
Calcium: 9.5 mg/dL (ref 8.9–10.3)
Chloride: 99 mmol/L (ref 98–111)
Creatinine, Ser: 0.87 mg/dL (ref 0.44–1.00)
GFR, Estimated: >60 mL/min (ref >60)
Glucose, Bld: 130 mg/dL — ABNORMAL HIGH (ref 70–99)
Potassium: 3.8 mmol/L (ref 3.5–5.1)
Sodium: 139 mmol/L (ref 135–145)
Total Bilirubin: 1 mg/dL (ref 0.0–1.2)
Total Protein: 7.8 g/dL (ref 6.5–8.1)

## 2024-01-24 LAB — TROPONIN T, HIGH SENSITIVITY
Troponin T High Sensitivity: 58 ng/L — ABNORMAL HIGH (ref 0–19)
Troponin T High Sensitivity: 57 ng/L — ABNORMAL HIGH (ref 0–19)

## 2024-01-24 LAB — PRO BRAIN NATRIURETIC PEPTIDE: Pro Brain Natriuretic Peptide: 5530 pg/mL — ABNORMAL HIGH (ref <300.0)

## 2024-01-24 MED ORDER — POTASSIUM CHLORIDE CRYS ER 20 MEQ PO TBCR
20.0000 meq | EXTENDED_RELEASE_TABLET | Freq: Every day | ORAL | Status: DC
  Administered 2024-01-24 – 2024-01-25 (×2): 20 meq via ORAL
  Filled 2024-01-24 (×2): qty 1

## 2024-01-24 MED ORDER — FUROSEMIDE 10 MG/ML IJ SOLN
20.0000 mg | Freq: Two times a day (BID) | INTRAMUSCULAR | Status: DC
  Administered 2024-01-25 (×2): 20 mg via INTRAVENOUS
  Filled 2024-01-24 (×2): qty 2

## 2024-01-24 MED ORDER — NITROGLYCERIN 0.4 MG SL SUBL
SUBLINGUAL_TABLET | SUBLINGUAL | Status: AC
  Filled 2024-01-24: qty 1

## 2024-01-24 MED ORDER — ACETAMINOPHEN 650 MG RE SUPP: 650.0000 mg | Freq: Four times a day (QID) | RECTAL | Status: DC | PRN

## 2024-01-24 MED ORDER — SENNOSIDES-DOCUSATE SODIUM 8.6-50 MG PO TABS: 1.0000 | ORAL_TABLET | Freq: Every evening | ORAL | Status: DC | PRN

## 2024-01-24 MED ORDER — ALBUTEROL SULFATE (2.5 MG/3ML) 0.083% IN NEBU
2.5000 mg | INHALATION_SOLUTION | Freq: Four times a day (QID) | RESPIRATORY_TRACT | Status: DC | PRN
Start: 2024-01-24 — End: 2024-01-30

## 2024-01-24 MED ORDER — GUAIFENESIN 100 MG/5ML PO LIQD
5.0000 mL | ORAL | Status: DC | PRN
  Administered 2024-01-24 – 2024-01-29 (×7): 5 mL via ORAL
  Filled 2024-01-24 (×7): qty 10

## 2024-01-24 MED ORDER — ENOXAPARIN SODIUM 40 MG/0.4ML IJ SOSY
40.0000 mg | PREFILLED_SYRINGE | Freq: Every day | INTRAMUSCULAR | Status: DC
  Administered 2024-01-25 – 2024-01-30 (×6): 40 mg via SUBCUTANEOUS
  Filled 2024-01-24 (×6): qty 0.4

## 2024-01-24 MED ORDER — NITROGLYCERIN 2 % TD OINT
1.0000 [in_us] | TOPICAL_OINTMENT | Freq: Once | TRANSDERMAL | Status: DC
  Filled 2024-01-24: qty 1

## 2024-01-24 MED ORDER — FUROSEMIDE 10 MG/ML IJ SOLN
20.0000 mg | Freq: Once | INTRAMUSCULAR | Status: AC
  Administered 2024-01-24: 20 mg via INTRAVENOUS
  Filled 2024-01-24: qty 2

## 2024-01-24 MED ORDER — PANTOPRAZOLE SODIUM 40 MG PO TBEC
40.0000 mg | DELAYED_RELEASE_TABLET | Freq: Every day | ORAL | Status: DC
  Administered 2024-01-25 – 2024-01-30 (×6): 40 mg via ORAL
  Filled 2024-01-24 (×6): qty 1

## 2024-01-24 MED ORDER — SODIUM CHLORIDE 0.9% FLUSH
3.0000 mL | Freq: Two times a day (BID) | INTRAVENOUS | Status: DC
  Administered 2024-01-25 – 2024-01-29 (×10): 3 mL via INTRAVENOUS

## 2024-01-24 MED ORDER — LOSARTAN POTASSIUM 25 MG PO TABS
25.0000 mg | ORAL_TABLET | Freq: Every day | ORAL | Status: DC
  Administered 2024-01-25 – 2024-01-27 (×3): 25 mg via ORAL
  Filled 2024-01-24 (×3): qty 1

## 2024-01-24 MED ORDER — COLESTIPOL HCL 1 G PO TABS
2.0000 g | ORAL_TABLET | Freq: Two times a day (BID) | ORAL | Status: DC
  Administered 2024-01-26 – 2024-01-30 (×9): 2 g via ORAL
  Filled 2024-01-24 (×13): qty 2

## 2024-01-24 MED ORDER — LEVOTHYROXINE SODIUM 112 MCG PO TABS
112.0000 ug | ORAL_TABLET | Freq: Every day | ORAL | Status: DC
  Administered 2024-01-25 – 2024-01-30 (×6): 112 ug via ORAL
  Filled 2024-01-24 (×6): qty 1

## 2024-01-24 MED ORDER — NITROGLYCERIN 2 % TD OINT
1.0000 [in_us] | TOPICAL_OINTMENT | Freq: Once | TRANSDERMAL | Status: AC
  Administered 2024-01-24: 1 [in_us] via TOPICAL
  Filled 2024-01-24: qty 1

## 2024-01-24 MED ORDER — ACETAMINOPHEN 325 MG PO TABS: 650.0000 mg | ORAL_TABLET | Freq: Four times a day (QID) | ORAL | Status: DC | PRN

## 2024-01-24 MED ORDER — HYDRALAZINE HCL 20 MG/ML IJ SOLN
5.0000 mg | Freq: Once | INTRAMUSCULAR | Status: AC
  Administered 2024-01-24: 5 mg via INTRAVENOUS
  Filled 2024-01-24: qty 1

## 2024-01-24 MED ORDER — PROCHLORPERAZINE EDISYLATE 10 MG/2ML IJ SOLN: 5.0000 mg | Freq: Four times a day (QID) | INTRAMUSCULAR | Status: DC | PRN

## 2024-01-24 MED ORDER — ROSUVASTATIN CALCIUM 20 MG PO TABS
20.0000 mg | ORAL_TABLET | Freq: Every day | ORAL | Status: DC
  Administered 2024-01-25 – 2024-01-30 (×6): 20 mg via ORAL
  Filled 2024-01-24 (×6): qty 1

## 2024-01-24 NOTE — ED Notes (Signed)
 ED Provider at bedside.

## 2024-01-24 NOTE — Telephone Encounter (Signed)
 Noted. FYI

## 2024-01-24 NOTE — H&P (Incomplete)
 Alexandria [name]

## 2024-01-24 NOTE — Telephone Encounter (Signed)
 Pts husband came here and wanted to let pcp know she is in the er having difficultly breathing and was going to cancel appt.

## 2024-01-24 NOTE — ED Provider Notes (Signed)
 [facility] Provider Note   CSN: [id number] Arrival date & time: 01/24/24  1504     Patient presents with: Shortness of Breath   Alexandria [name] is a 78 y.o. female.   HPI 78 year old female presents with shortness of breath.  She has been feeling short of breath for about 3 days.  Has also had a cough with some clear sputum during this time.  She denies any fevers or sore throat.  Has intermittent dull chest pain that seems to be random.  No leg swelling or abdominal pain.  Has had less appetite.  Denies any vomiting.  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  She does not wear oxygen at baseline but her O2 sats were in the mid 80s on arrival and she was placed on 4 L.  No current chest pain or orthopnea.  Prior to Admission medications   Medication Sig Start Date End Date Taking? Authorizing Provider  amoxicillin -clavulanate (AUGMENTIN ) 875-125 MG tablet Take 1 tablet by mouth 2 (two) times daily. 10/28/23   Lowne Chase, Yvonne R, DO  azelastine  (ASTELIN ) 0.1 % nasal spray Place 1 spray into both nostrils 2 (two) times daily. Use in each nostril as directed 12/13/23   Antonio Meth, [name], DO  benzonatate  (TESSALON ) 200 MG capsule Take 1 capsule (200 mg total) by mouth 3 (three) times daily as needed for cough. 08/28/23   Drusilla Sabas RAMAN, MD  colestipol  (COLESTID ) 1 g tablet Take 2 tablets (2 g total) by mouth 2 (two) times daily. 10/27/23 01/25/24  Federico Rosario BROCKS, MD  diphenoxylate -atropine  (LOMOTIL ) 2.5-0.025 MG tablet Take 2 tablets by mouth 3 (three) times daily as needed for diarrhea or loose stools. 07/07/23   [provider]  esomeprazole  (NEXIUM ) 40 MG capsule Take 1 capsule (40 mg total) by mouth 2 (two) times daily before a meal. 01/06/24   Federico Rosario BROCKS, MD  fexofenadine (ALLEGRA ALLERGY ) 180 MG tablet Take 1 tablet (180 mg total) by mouth daily. 12/13/23   Antonio Meth [name], DO  furosemide  (LASIX ) 20 MG tablet Take 1 tablet (20 mg total) by mouth daily. Patient taking differently: Take 10 mg by mouth daily as needed for edema or fluid. 02/05/23   Antonio Meth [name], DO  levalbuterol  (XOPENEX ) 1.25 MG/3ML nebulizer solution Take 1.25 mg by nebulization every 6 (six) hours as needed for wheezing or shortness of breath. 09/03/23   Lowne Chase, Yvonne R, DO  losartan  (COZAAR ) 25 MG tablet Take 1 tablet (25 mg total) by mouth daily. 11/01/23 01/30/24  Trudy Birmingham, PA-C  magnesium  oxide (MAG-OX) 400 MG tablet Take 1 tablet (400 mg total) by mouth daily. 06/23/23   [facility], Jon Garre, PA  Multiple Vitamin (MULTIVITAMIN WITH MINERALS) TABS tablet Take 1 tablet by mouth once a week.    [provider]  ondansetron  (ZOFRAN ) 4 MG tablet Take 1 tablet (4 mg total) by mouth every 8 (eight) hours as needed. 01/10/24   Antonio Meth [name], DO  potassium chloride  (KLOR-CON  M) 20 MEQ tablet Take 1 tablet (20 mEq total) by mouth daily for 3 days. 09/14/23 11/16/23  Craig Palma R, PA-C  promethazine -dextromethorphan (PROMETHAZINE -DM) 6.25-15 MG/5ML syrup TAKE BY MOUTH 4 TIMES A DAY AS NEEDED 01/11/24   Antonio Meth [name], DO  Respiratory Therapy Supplies (FULL KIT NEBULIZER SET) MISC Use with DUONEB for COPD treatment  09/03/23   Lowne Chase, Yvonne R, DO  rosuvastatin (CRESTOR) 20 MG tablet Take 1 tablet (20 mg total) by mouth daily. 01/03/24 04/02/24  Croitoru, Jerel, MD  SYNTHROID  112 MCG tablet Take 1 tablet (112 mcg total) by mouth daily before breakfast. 01/04/24   Antonio Meth, Yvonne R, DO  vitamin C  (ASCORBIC ACID ) 500 MG tablet Take 500 mg by mouth 2 (two) times a week.    [provider]    Allergies: Prednisone , Dilaudid  [hydromorphone ], Ipratropium bromide , Atrovent  hfa [ipratropium bromide  hfa], Cheratussin ac [guaifenesin -codeine ], Codeine , Diltiazem , Hydrocodone, Ibuprofen , Influenza vaccines, Morphine , and Oxycodone     Review of  Systems  Constitutional:  Positive for appetite change. Negative for fever.  Respiratory:  Positive for cough and shortness of breath.   Cardiovascular:  Positive for chest pain. Negative for leg swelling.  Gastrointestinal:  Negative for abdominal pain and vomiting.    Updated Vital Signs BP (!) 141/83   Pulse 72   Temp (!) 97.5 F (36.4 C)   Resp (!) 25   Ht 5' 4 (1.626 m)   Wt 54.4 kg   SpO2 99%   BMI 20.60 kg/m   Physical Exam Vitals and nursing note reviewed.  Constitutional:      General: She is not in acute distress.    Appearance: She is well-developed. She is not ill-appearing or diaphoretic.  HENT:     Head: Normocephalic and atraumatic.  Cardiovascular:     Rate and Rhythm: Normal rate and regular rhythm.     Heart sounds: Normal heart sounds.  Pulmonary:     Effort: Tachypnea present. No accessory muscle usage.     Breath sounds: No wheezing.     Comments: Mild rales Abdominal:     Palpations: Abdomen is soft.     Tenderness: There is no abdominal tenderness.  Musculoskeletal:     Right lower leg: No edema.     Left lower leg: No edema.  Skin:    General: Skin is warm and dry.  Neurological:     Mental Status: She is alert.     (all labs ordered are listed, but only abnormal results are displayed) Labs Reviewed  COMPREHENSIVE METABOLIC PANEL WITH GFR - Abnormal; Notable for the following components:      Result Value   Glucose, Bld 130 (*)    All other components within normal limits  PRO BRAIN NATRIURETIC PEPTIDE - Abnormal; Notable for the following components:   Pro Brain Natriuretic Peptide 5,530.0 (*)    All other components within normal limits  TROPONIN T, HIGH SENSITIVITY - Abnormal; Notable for the following components:   Troponin T High Sensitivity 58 (*)    All other components within normal limits  RESP PANEL BY RT-PCR (RSV, FLU A&B, COVID)  RVPGX2  CBC WITH DIFFERENTIAL/PLATELET  TROPONIN T, HIGH SENSITIVITY    EKG: EKG  Interpretation Date/Time:  Monday January 24 2024 15:22:23 EDT Ventricular Rate:  82 PR Interval:  166 QRS Duration:  142 QT Interval:  455 QTC Calculation: 532 R Axis:   88  Text Interpretation: Sinus rhythm Right bundle branch block Confirmed by Freddi Hamilton [phone]) on 01/24/2024 3:29:00 PM  Radiology: DG Chest 2 View Result Date: 01/24/2024 EXAM: 2 VIEW(S) XRAY OF THE CHEST 01/24/2024 03:46:11 PM COMPARISON: 10/29/2023 CLINICAL HISTORY: shob. 78 y/o female c/o shob x 3 days. Also c/o chest pain x 3 days. FINDINGS: LUNGS AND PLEURA: Diffuse, bilateral reticular interstitial opacities are identified. Thickening along the minor fissure of the  right lung is again noted and appears unchanged. Lung volumes are low. No pulmonary edema. No pleural effusion. No pneumothorax. HEART AND MEDIASTINUM: Aortic atherosclerotic calcification. No acute abnormality of the cardiac and mediastinal silhouettes. BONES AND SOFT TISSUES: No acute osseous abnormality. IMPRESSION: 1. No acute findings. 2. Diffuse, bilateral reticular interstitial opacities and right minor fissure thickening, unchanged from prior study. 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 3. Aortic atherosclerotic calcification. Electronically signed by: Waddell Calk MD 01/24/2024 04:19 PM EDT RP Workstation: [id number]     Procedures   Medications Ordered in the ED  furosemide  (LASIX ) injection 20 mg (has no administration in time range)                                    Medical Decision Making Amount and/or Complexity of Data Reviewed Labs: ordered.    Details: Significantly elevated BNP.  Mildly elevated troponin. Radiology: ordered and independent interpretation performed.    Details: Pulmonary fibrosis, perhaps some acute infiltrate on top of this ECG/medicine tests: ordered and independent interpretation performed.    Details: Right bundle branch  block  Risk Prescription drug management. Decision regarding hospitalization.   Patient presents with dyspnea, tachypnea, and hypoxia.  She is not in distress.  Workup shows what is probably CHF with a significantly elevated pro BNP, which in her age range is most likely CHF.  She does have a known history of diastolic CHF.  Also has a history of aortic regurgitation.  Discussed her workup and treatment with cardiology on-call, Dr. Lavona.  Based on his review of her chart, feels this is most likely a diastolic CHF problem and the aortic regurgitation is likely not causative today.  Would recommend treating as typical CHF.  If hospitalist still needs cardiology help they can consult when she arrives to the hospital.  Given her symptoms and currently requiring 2 L of oxygen, she will need admission.  Discussed with hospitalist, Dr. Raenelle.     Final diagnoses:  Acute respiratory failure with hypoxia (HCC)  Acute diastolic congestive heart failure [facility])    ED Discharge Orders     None          Freddi Hamilton, MD 01/24/24 1659

## 2024-01-24 NOTE — ED Triage Notes (Addendum)
 Pt c/o shob x 3 days.   Also c/o chest pain x 3 days.  O2 sats 85% in triage. Denies recent illness, smoking, copd.  4L O2 via [place] applied in triage, O2 increased to 92%.   RT to triage to assess.

## 2024-01-24 NOTE — ED Notes (Signed)
[facility] at the bedside at this time. 

## 2024-01-25 DIAGNOSIS — J84112 Idiopathic pulmonary fibrosis: Secondary | ICD-10-CM | POA: Diagnosis not present

## 2024-01-25 DIAGNOSIS — E039 Hypothyroidism, unspecified: Secondary | ICD-10-CM | POA: Diagnosis not present

## 2024-01-25 DIAGNOSIS — E785 Hyperlipidemia, unspecified: Secondary | ICD-10-CM | POA: Insufficient documentation

## 2024-01-25 DIAGNOSIS — I5033 Acute on chronic diastolic (congestive) heart failure: Secondary | ICD-10-CM | POA: Diagnosis not present

## 2024-01-25 DIAGNOSIS — I10 Essential (primary) hypertension: Secondary | ICD-10-CM | POA: Diagnosis not present

## 2024-01-25 LAB — CBC
HCT: 39.3 % (ref 36.0–46.0)
Hemoglobin: 12.8 g/dL (ref 12.0–15.0)
MCH: 32.1 pg (ref 26.0–34.0)
MCHC: 32.6 g/dL (ref 30.0–36.0)
MCV: 98.5 fL (ref 80.0–100.0)
Platelets: 215 K/uL (ref 150–400)
RBC: 3.99 MIL/uL (ref 3.87–5.11)
RDW: 14.7 % (ref 11.5–15.5)
WBC: 7 K/uL (ref 4.0–10.5)
nRBC: 0 % (ref 0.0–0.2)

## 2024-01-25 LAB — BASIC METABOLIC PANEL WITH GFR
Anion gap: 9 (ref 5–15)
BUN: 11 mg/dL (ref 8–23)
CO2: 29 mmol/L (ref 22–32)
Calcium: 8.4 mg/dL — ABNORMAL LOW (ref 8.9–10.3)
Chloride: 98 mmol/L (ref 98–111)
Creatinine, Ser: 0.89 mg/dL (ref 0.44–1.00)
GFR, Estimated: >60 mL/min (ref >60)
Glucose, Bld: 97 mg/dL (ref 70–99)
Potassium: 3.8 mmol/L (ref 3.5–5.1)
Sodium: 136 mmol/L (ref 135–145)

## 2024-01-25 LAB — MAGNESIUM: Magnesium: 2.1 mg/dL (ref 1.7–2.4)

## 2024-01-25 MED ORDER — FUROSEMIDE 10 MG/ML IJ SOLN
40.0000 mg | Freq: Two times a day (BID) | INTRAMUSCULAR | Status: DC
  Administered 2024-01-26 – 2024-01-27 (×3): 40 mg via INTRAVENOUS
  Filled 2024-01-25 (×3): qty 4

## 2024-01-25 MED ORDER — SPIRONOLACTONE 12.5 MG HALF TABLET
12.5000 mg | ORAL_TABLET | Freq: Every day | ORAL | Status: DC
  Administered 2024-01-26: 12.5 mg via ORAL
  Filled 2024-01-25: qty 1

## 2024-01-25 MED ORDER — BUDESONIDE 0.25 MG/2ML IN SUSP
0.2500 mg | Freq: Two times a day (BID) | RESPIRATORY_TRACT | Status: DC
  Administered 2024-01-26 – 2024-01-30 (×9): 0.25 mg via RESPIRATORY_TRACT
  Filled 2024-01-25 (×9): qty 2

## 2024-01-25 NOTE — Progress Notes (Signed)
 Progress Note   Patient: Alexandria [name] FMW:[id number] DOB: 28-Jan-1946 DOA: 01/24/2024     1 DOS: the patient was seen and examined on 01/25/2024   Brief hospital course: Mrs. Staib was admitted to the hospital with the working diagnosis of  heart failure exacerbation.  78 yo female with the past medical history of heart failure, pulmonary fibrosis, HERD and history of TIA who presented with dyspnea and cough.  Reported 3 days of worsening dyspnea on exertion. No worsening orthopnea, no PND or lower extremity edema. Because persistent symptoms she presented to the ED where her 02 saturation was 85%, she was placed on 4 L/min per [place] with 02 saturation at 92%.  Blood pressure 144/69, HR 73, RR 18. Lungs with rales bilaterally with no wheezing or rhonchi, heart with S1 and S2 present and regular, with no gallops, abdomen with no distention and no lower extremity edema.   Na 139, K 3.8 Cl 99 bicarbonate 27 glucose 130 bun 11 cr 0.87  BNP 5,530 High sensitive troponin 58 and 57  Wbc 7.1 hgb 14.3 plt 269 Sars covid 19 negative Influenza negative RSV negative   Chest radiograph with bilateral interstitial infiltrates diffuse, with predominance at the left lower lobe.  No effusions, positive cardiomegaly   EKG 82 bpm, left axis deviation, right bundle branch block, qtc 532, sinus rhythm with bilateral atrial enlargement, no significant ST segment or T wave changes.   Assessment and Plan: * Acute on chronic diastolic CHF (congestive heart failure) (HCC) Echocardiogram with preserved LV systolic function with EF 60 to 65%, mild LVH, RV systolic function with mild reduction, RVSP 26 mmHg, aortic insufficiency severe, mild dilatation of the ascending aorta.   Documented urine output is 300 cc Systolic blood pressure 120 mmHg range  Plan to continue diuresis with furosemide  increase dose to 40 mg po bid Continue losartan  and add spironolactone    Old records personally reviewed  outpatient cardiology, determined moderate AI and recommendation for yearly echocardiogram.   Essential hypertension Continue blood pressure control with losartan    IPF (idiopathic pulmonary fibrosis) (HCC) Complicated with pulmonary hypertension PE was ruled out 08/2023 No signs of acute exacerbation, for now but will consider if patient not improving with diuresis and has rapid worsening oxygenation   plan to continue supplemental 02 per [place] Bronchodilator therapy and inhaled steroids.  Continue supplemental 02 per [place]   Hypothyroidism Continue with levothyroxine    Dyslipidemia Continue statin therapy      Subjective: Patient continue to have dyspnea at rest with no chest pain, no PND or lower extremity edema   Physical Exam: Vitals:   01/24/24 2123 01/24/24 2227 01/25/24 0130 01/25/24 0350  BP:    (!) 169/79  Pulse:    65  Resp:    18  Temp:    98.3 F (36.8 C)  TempSrc:    Oral  SpO2: 98%   97%  Weight:  55.3 kg 54.4 kg   Height:  5' 4 (1.626 m)     Neurology awake and alert ENT with mild pallor Cardiovascular with S1 and S2 present and regular with no gallops, rubs or murmurs Respiratory with distant breath sounds, positive bilateral rales with no wheezing or rhonchi Abdomen with no distention  No lower extremity edema   Data Reviewed:    Family Communication: on family at the bedside   Disposition: Status is: Inpatient Remains inpatient appropriate because: respiratory failure   Planned Discharge Destination: Home     Author: Elidia Toribio Furnace,  MD 01/25/2024 12:21 PM  For on call review [email or web address].

## 2024-01-25 NOTE — Assessment & Plan Note (Signed)
 Continue with levothyroxine

## 2024-01-25 NOTE — Assessment & Plan Note (Signed)
 Continue blood pressure control with losartan 

## 2024-01-25 NOTE — Hospital Course (Signed)
 Alexandria [name] was admitted to the hospital with the working diagnosis of  heart failure exacerbation.  78 yo female with the past medical history of heart failure, pulmonary fibrosis, HERD and history of TIA who presented with dyspnea and cough.  Reported 3 days of worsening dyspnea on exertion. No worsening orthopnea, no PND or lower extremity edema. Because persistent symptoms she presented to the ED where her 02 saturation was 85%, she was placed on 4 L/min per [place] with 02 saturation at 92%.  Blood pressure 144/69, HR 73, RR 18. Lungs with rales bilaterally with no wheezing or rhonchi, heart with S1 and S2 present and regular, with no gallops, abdomen with no distention and no lower extremity edema.   Na 139, K 3.8 Cl 99 bicarbonate 27 glucose 130 bun 11 cr 0.87  BNP 5,530 High sensitive troponin 58 and 57  Wbc 7.1 hgb 14.3 plt 269 Sars covid 19 negative Influenza negative RSV negative   Chest radiograph with bilateral interstitial infiltrates diffuse, with predominance at the left lower lobe.  No effusions, positive cardiomegaly   EKG 82 bpm, left axis deviation, right bundle branch block, qtc 532, sinus rhythm with bilateral atrial enlargement, no significant ST segment or T wave changes.

## 2024-01-25 NOTE — Progress Notes (Signed)
 PROGRESS NOTE    CANNON QUINTON  FMW:[id number] DOB: 1945/06/18 DOA: 01/24/2024 PCP: Antonio Cyndee [name], DO   78/F w IPF, chronic HFpEF, hypothyroidism, IBS, GERD, and history of TIA who presents with shortness of breath and cough over the past 3d, + chest discomfort. In ED, hypoxic, 85% on room air with tachypnea, Chest x-ray shows chronic ILD changes without acute findings.  Labs are notable for normal CMP, normal CBC, negative COVID, influenza, and RSV PCR, troponin 58, and proBNP 5530.  Subjective:   Assessment and Plan:  Acute on chronic HFpEF; acute hypoxic respiratory failure  - EF was 60-65% with question of severe AS on echo from September 2025  - Pro-BNP much higher than prior - Continue diuresis  with IV Lasix , monitor weight and I/Os, monitor electrolytes and renal function  -continue losartan    Chest pain  - Recent chest discomfort at rest - Troponin mildly elevated and flat, not consistent with ACS  - Diurese as above, consider CTA chest if she fails to improve as expected with diuresis     Hypertension  - Losartan       Hypothyroidism  - Synthroid       ILD  - CXR appears stable, low suspicion for acute exacerbation  - Continue outpatient follow-up     DVT prophylaxis: lovenox  Code Status: full Family Communication: Disposition Plan:   Consultants:    Procedures:   Antimicrobials:    Objective: Vitals:   01/24/24 2227 01/25/24 0130 01/25/24 0350 01/25/24 1210  BP:   (!) 169/79 120/86  Pulse:   65 85  Resp:   18   Temp:   98.3 F (36.8 C) 97.7 F (36.5 C)  TempSrc:   Oral Oral  SpO2:   97% 96%  Weight: 55.3 kg 54.4 kg    Height: 5' 4 (1.626 m)       Intake/Output Summary (Last 24 hours) at 01/25/2024 1441 Last data filed at 01/25/2024 1300 Gross per 24 hour  Intake 720 ml  Output 1000 ml  Net -280 ml   Filed Weights   01/24/24 1513 01/24/24 2227 01/25/24 0130  Weight: 54.4 kg 55.3 kg 54.4 kg     Examination:      Data Reviewed:   CBC: Recent Labs  Lab 01/24/24 1529 01/25/24 0218  WBC 7.1 7.0  NEUTROABS 4.2  --   HGB 14.3 12.8  HCT 43.6 39.3  MCV 96.9 98.5  PLT 269 215   Basic Metabolic Panel: Recent Labs  Lab 01/24/24 1529 01/25/24 0218  NA 139 136  K 3.8 3.8  CL 99 98  CO2 27 29  GLUCOSE 130* 97  BUN 11 11  CREATININE 0.87 0.89  CALCIUM  9.5 8.4*  MG  --  2.1   GFR: Estimated Creatinine Clearance: 44 mL/min (by C-G formula based on SCr of 0.89 mg/dL). Liver Function Tests: Recent Labs  Lab 01/24/24 1529  AST 22  ALT 6  ALKPHOS 96  BILITOT 1.0  PROT 7.8  ALBUMIN  4.1   No results for input(s): LIPASE, AMYLASE in the last 168 hours. No results for input(s): AMMONIA in the last 168 hours. Coagulation Profile: No results for input(s): INR, PROTIME in the last 168 hours. Cardiac Enzymes: No results for input(s): CKTOTAL, CKMB, CKMBINDEX, TROPONINI in the last 168 hours. BNP (last 3 results) Recent Labs    08/25/23 0644 01/24/24 1529  PROBNP 713.0* 5,530.0*   HbA1C: No results for input(s): HGBA1C in the last 72 hours. CBG: No results  for input(s): GLUCAP in the last 168 hours. Lipid Profile: No results for input(s): CHOL, HDL, LDLCALC, TRIG, CHOLHDL, LDLDIRECT in the last 72 hours. Thyroid  Function Tests: No results for input(s): TSH, T4TOTAL, FREET4, T3FREE, THYROIDAB in the last 72 hours. Anemia Panel: No results for input(s): VITAMINB12, FOLATE, FERRITIN, TIBC, IRON , RETICCTPCT in the last 72 hours. Urine analysis:    Component Value Date/Time   COLORURINE YELLOW 08/28/2022 1651   APPEARANCEUR CLEAR 08/28/2022 1651   LABSPEC 1.015 08/28/2022 1651   PHURINE 5.5 08/28/2022 1651   GLUCOSEU NEGATIVE 08/28/2022 1651   HGBUR NEGATIVE 08/28/2022 1651   BILIRUBINUR small 04/23/2023 1437   KETONESUR NEGATIVE 08/28/2022 1651   PROTEINUR Negative 04/23/2023 1437   PROTEINUR  NEGATIVE 08/28/2022 1651   UROBILINOGEN 0.2 04/23/2023 1437   NITRITE Negative 04/23/2023 1437   NITRITE NEGATIVE 08/28/2022 1651   LEUKOCYTESUR Trace (A) 04/23/2023 1437   LEUKOCYTESUR NEGATIVE 08/28/2022 1651   Sepsis Labs: @LABRCNTIP (procalcitonin:4,lacticidven:4)  ) Recent Results (from the past 240 hours)  Resp panel by RT-PCR (RSV, Flu A&B, Covid) Anterior Nasal Swab     Status: None   Collection Time: 01/24/24  3:29 PM   Specimen: Anterior Nasal Swab  Result Value Ref Range Status   SARS Coronavirus 2 by RT PCR NEGATIVE NEGATIVE Final    Comment: (NOTE) SARS-CoV-2 target nucleic acids are NOT DETECTED.  The SARS-CoV-2 RNA is generally detectable in upper respiratory specimens during the acute phase of infection. The lowest concentration of SARS-CoV-2 viral copies this assay can detect is 138 copies/mL. A negative result does not preclude SARS-Cov-2 infection and should not be used as the sole basis for treatment or other patient management decisions. A negative result may occur with  improper specimen collection/handling, submission of specimen other than nasopharyngeal swab, presence of viral mutation(s) within the areas targeted by this assay, and inadequate number of viral copies(<138 copies/mL). A negative result must be combined with clinical observations, patient history, and epidemiological information. The expected result is Negative.  Fact Sheet for Patients:  BloggerCourse.com  Fact Sheet for Healthcare Providers:  SeriousBroker.it  This test is no t yet approved or cleared by the [place]  FDA and  has been authorized for detection and/or diagnosis of SARS-CoV-2 by FDA under an Emergency Use Authorization (EUA). This EUA will remain  in effect (meaning this test can be used) for the duration of the COVID-19 declaration under Section 564(b)(1) of the Act, 21 U.S.C.section 360bbb-3(b)(1), unless the  authorization is terminated  or revoked sooner.       Influenza A by PCR NEGATIVE NEGATIVE Final   Influenza B by PCR NEGATIVE NEGATIVE Final    Comment: (NOTE) The Xpert Xpress SARS-CoV-2/FLU/RSV plus assay is intended as an aid in the diagnosis of influenza from Nasopharyngeal swab specimens and should not be used as a sole basis for treatment. Nasal washings and aspirates are unacceptable for Xpert Xpress SARS-CoV-2/FLU/RSV testing.  Fact Sheet for Patients: BloggerCourse.com  Fact Sheet for Healthcare Providers: SeriousBroker.it  This test is not yet approved or cleared by the [place]  FDA and has been authorized for detection and/or diagnosis of SARS-CoV-2 by FDA under an Emergency Use Authorization (EUA). This EUA will remain in effect (meaning this test can be used) for the duration of the COVID-19 declaration under Section 564(b)(1) of the Act, 21 U.S.C. section 360bbb-3(b)(1), unless the authorization is terminated or revoked.     Resp Syncytial Virus by PCR NEGATIVE NEGATIVE Final    Comment: (NOTE) Fact Sheet  for Patients: BloggerCourse.com  Fact Sheet for Healthcare Providers: SeriousBroker.it  This test is not yet approved or cleared by the [place]  FDA and has been authorized for detection and/or diagnosis of SARS-CoV-2 by FDA under an Emergency Use Authorization (EUA). This EUA will remain in effect (meaning this test can be used) for the duration of the COVID-19 declaration under Section 564(b)(1) of the Act, 21 U.S.C. section 360bbb-3(b)(1), unless the authorization is terminated or revoked.  Performed at [facility], [address], [place], [place] [address]      Radiology Studies: DG Chest 2 View Result Date: 01/24/2024 EXAM: 2 VIEW(S) XRAY OF THE CHEST 01/24/2024 03:46:11 PM COMPARISON: 10/29/2023 CLINICAL HISTORY: shob. 78  y/o female c/o shob x 3 days. Also c/o chest pain x 3 days. FINDINGS: LUNGS AND PLEURA: Diffuse, bilateral reticular interstitial opacities are identified. Thickening along the minor fissure of the right lung is again noted and appears unchanged. Lung volumes are low. No pulmonary edema. No pleural effusion. No pneumothorax. HEART AND MEDIASTINUM: Aortic atherosclerotic calcification. No acute abnormality of the cardiac and mediastinal silhouettes. BONES AND SOFT TISSUES: No acute osseous abnormality. IMPRESSION: 1. No acute findings. 2. Diffuse, bilateral reticular interstitial opacities and right minor fissure thickening, unchanged from prior study. 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 3. Aortic atherosclerotic calcification. Electronically signed by: Taylor Stroud MD 01/24/2024 04:19 PM EDT RP Workstation: [id number]     Scheduled Meds:  colestipol   2 g Oral BID   enoxaparin  (LOVENOX ) injection  40 mg Subcutaneous Daily   furosemide   20 mg Intravenous BID   levothyroxine   112 mcg Oral QAC breakfast   losartan   25 mg Oral Daily   pantoprazole   40 mg Oral Daily   potassium chloride   20 mEq Oral Daily   rosuvastatin  20 mg Oral Daily   sodium chloride  flush  3 mL Intravenous Q12H   Continuous Infusions:   LOS: 1 day    Time spent:    Sigurd Pac, MD [facility]   01/25/2024, 2:41 PM

## 2024-01-25 NOTE — Assessment & Plan Note (Addendum)
 Complicated with pulmonary hypertension PE was ruled out 08/2023 No signs of acute exacerbation, for now but will consider if patient not improving with diuresis and has rapid worsening oxygenation   plan to continue supplemental 02 per [place] Bronchodilator therapy and inhaled steroids.  Continue supplemental 02 per [place]

## 2024-01-25 NOTE — Assessment & Plan Note (Addendum)
 Echocardiogram with preserved LV systolic function with EF 60 to 65%, mild LVH, RV systolic function with mild reduction, RVSP 26 mmHg, aortic insufficiency severe, mild dilatation of the ascending aorta.   Documented urine output is 300 cc Systolic blood pressure 120 mmHg range  Plan to continue diuresis with furosemide  increase dose to 40 mg po bid Continue losartan  and add spironolactone    Old records personally reviewed outpatient cardiology, determined moderate AI and recommendation for yearly echocardiogram.

## 2024-01-25 NOTE — Assessment & Plan Note (Signed)
 Continue statin therapy

## 2024-01-26 DIAGNOSIS — I5033 Acute on chronic diastolic (congestive) heart failure: Secondary | ICD-10-CM | POA: Diagnosis not present

## 2024-01-26 LAB — BASIC METABOLIC PANEL WITH GFR
Anion gap: 10 (ref 5–15)
BUN: 15 mg/dL (ref 8–23)
CO2: 28 mmol/L (ref 22–32)
Calcium: 8.7 mg/dL — ABNORMAL LOW (ref 8.9–10.3)
Chloride: 98 mmol/L (ref 98–111)
Creatinine, Ser: 1 mg/dL (ref 0.44–1.00)
GFR, Estimated: 57 mL/min — ABNORMAL LOW (ref >60)
Glucose, Bld: 160 mg/dL — ABNORMAL HIGH (ref 70–99)
Potassium: 3.8 mmol/L (ref 3.5–5.1)
Sodium: 136 mmol/L (ref 135–145)

## 2024-01-26 LAB — MAGNESIUM: Magnesium: 2 mg/dL (ref 1.7–2.4)

## 2024-01-26 MED ORDER — POTASSIUM CHLORIDE CRYS ER 20 MEQ PO TBCR
20.0000 meq | EXTENDED_RELEASE_TABLET | Freq: Once | ORAL | Status: AC
  Administered 2024-01-26: 20 meq via ORAL
  Filled 2024-01-26: qty 1

## 2024-01-26 MED ORDER — SPIRONOLACTONE 25 MG PO TABS
25.0000 mg | ORAL_TABLET | Freq: Every day | ORAL | Status: DC
  Administered 2024-01-27 – 2024-01-30 (×4): 25 mg via ORAL
  Filled 2024-01-26 (×4): qty 1

## 2024-01-26 NOTE — Plan of Care (Signed)
  Problem: Education: Goal: Knowledge of General Education information will improve Description: Including pain rating scale, medication(s)/side effects and non-pharmacologic comfort measures Outcome: Progressing   Problem: Health Behavior/Discharge Planning: Goal: Ability to manage health-related needs will improve Outcome: Progressing   Problem: Clinical Measurements: Goal: Ability to maintain clinical measurements within normal limits will improve Outcome: Progressing Goal: Will remain free from infection Outcome: Progressing Goal: Diagnostic test results will improve Outcome: Progressing Goal: Cardiovascular complication will be avoided Outcome: Progressing   Problem: Activity: Goal: Risk for activity intolerance will decrease Outcome: Progressing   Problem: Nutrition: Goal: Adequate nutrition will be maintained Outcome: Progressing   Problem: Coping: Goal: Level of anxiety will decrease Outcome: Progressing   Problem: Elimination: Goal: Will not experience complications related to bowel motility Outcome: Progressing Goal: Will not experience complications related to urinary retention Outcome: Progressing   Problem: Pain Managment: Goal: General experience of comfort will improve and/or be controlled Outcome: Progressing   Problem: Safety: Goal: Ability to remain free from injury will improve Outcome: Progressing   Problem: Skin Integrity: Goal: Risk for impaired skin integrity will decrease Outcome: Progressing   Problem: Education: Goal: Ability to demonstrate management of disease process will improve Outcome: Progressing Goal: Ability to verbalize understanding of medication therapies will improve Outcome: Progressing   Problem: Activity: Goal: Capacity to carry out activities will improve Outcome: Progressing   Problem: Cardiac: Goal: Ability to achieve and maintain adequate cardiopulmonary perfusion will improve Outcome: Progressing   Problem:  Clinical Measurements: Goal: Respiratory complications will improve Outcome: Not Progressing

## 2024-01-26 NOTE — Progress Notes (Signed)
 Patient had abrupt coughing spell and was unable to catch breath. Patient given robitussin and upped to [facility].

## 2024-01-26 NOTE — Progress Notes (Signed)
 Patient Sob with exertion. Has to be bumped up to 4-5LNC with transfer to and from [facility]. Currently on [facility] at rest.

## 2024-01-26 NOTE — TOC Initial Note (Signed)
 Transition of Care [facility]) - Initial/Assessment Note    Patient Details  Name: Alexandria [name] MRN: [id number] Date of Birth: [birth year]  [facility] CM/SW Contact:    Nola Devere Hands, RN Phone Number: 01/26/2024, 9:35 AM  Clinical Narrative:                 Patient is a 78 yr old female, from home with spouse.Has been independent prior to hospitalization. Admitted with CHF exacerbation. ICM Team will continue to follow.         Patient Goals and CMS Choice            Expected Discharge Plan and Services                                              Prior Living Arrangements/Services                       Activities of Daily Living   ADL Screening (condition at time of admission) Independently performs ADLs?: Yes (appropriate for developmental age) Is the patient deaf or have difficulty hearing?: No Does the patient have difficulty seeing, even when wearing glasses/contacts?: No Does the patient have difficulty concentrating, remembering, or making decisions?: No  Permission Sought/Granted                  Emotional Assessment              Admission diagnosis:  Acute diastolic congestive heart failure (HCC) [I50.31] Acute respiratory failure with hypoxia (HCC) [J96.01] Acute respiratory failure with hypoxemia (HCC) [J96.01] Patient Active Problem List   Diagnosis Date Noted   Dyslipidemia 01/25/2024   Acute respiratory failure with hypoxemia (HCC) 01/24/2024   Acute on chronic diastolic CHF (congestive heart failure) (HCC) 01/24/2024   History of pneumonia 09/16/2023   CAP (community acquired pneumonia) 08/25/2023   Pneumonia of right lower lobe due to infectious organism 06/10/2023   Suspicious nevus 01/05/2023   Anemia 01/05/2023   Deviated septum 01/05/2023   Episodic tension-type headache, not intractable 11/19/2022   Old myocardial infarction 11/19/2022   Postthrombotic syndrome 11/19/2022    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   Irritable bowel syndrome without diarrhea 11/19/2022   Ileostomy care (HCC) 11/18/2022   Diaper candidiasis 09/28/2022   Ileostomy, has currently (HCC) 09/28/2022   At risk for dehydration 09/16/2022   At high risk for dehydration 09/14/2022   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   Dehydration 08/26/2022   Irritant contact dermatitis associated with digestive stoma 08/26/2022   Ileostomy in place [facility]) 08/26/2022   High output ileostomy (HCC) 08/25/2022   Diastolic dysfunction 08/14/2022   Chronic facial pain 08/14/2022   Dyspepsia 08/14/2022   Blowout of rectal stump (HCC) 08/14/2022   Diverticulitis of colon 08/13/2022   Colostomy care (HCC) 07/31/2022   Bronchospasm 06/16/2022   Temporal arteritis (HCC) 06/16/2022   Chronic obstructive pulmonary disease with (acute) lower respiratory infection (HCC) 06/16/2022   Angina pectoris with documented spasm 06/16/2022   Abdominal aortic aneurysm (AAA) without rupture 06/16/2022   Right ankle sprain 06/09/2022   Sprain of right ankle 06/01/2022   Dizziness 06/01/2022   Rectal abscess 06/01/2022   Acute right ankle pain 05/19/2022   History of low anterior resection of rectum 05/04/2022   Irritant contact dermatitis associated with fecal stoma 05/02/2022   Slow transit constipation  05/02/2022   Colostomy complication (HCC) 05/02/2022   IDA (iron  deficiency anemia) 04/19/2022   Intra-abdominal abscess (HCC) 04/18/2022   Acute blood loss anemia 04/17/2022   Chronic diastolic CHF (congestive heart failure) (HCC) 04/17/2022   Toxic metabolic encephalopathy 04/17/2022   Ileus following gastrointestinal surgery (HCC) 04/17/2022   Pelvic abscess in female 03/30/2022   Flat foot 11/19/2021   S/P total knee arthroplasty, left 11/13/2021   Ankle impingement syndrome, left 11/13/2021   Rectal bleeding 08/22/2021   Abnormal thyroid  blood test 08/22/2021   Abnormal kidney function 08/22/2021   Dumping  syndrome 03/13/2020   Arthritis    Bronchial pneumonia    Colon polyp    DVT (deep venous thrombosis) (HCC)    Heart murmur    Migraines    Pancreatitis    Thyroid  disease    Aortic root dilatation 11/24/2019   Leukocytosis 11/06/2019   Diarrhea 11/06/2019   Generalized abdominal pain 11/06/2019   Pre-ulcerative corn or callous 09/07/2019   Nausea 09/07/2019   Pain of left calf 05/09/2019   Tibial pain 05/09/2019   Essential hypertension 02/08/2019   Healthcare maintenance 11/10/2018   Bradycardia 07/22/2018   Pansinusitis 06/23/2018   History of transient ischemic attack (TIA) 03/24/2018   IPF (idiopathic pulmonary fibrosis) (HCC) 03/24/2018   Chronic rhinitis 02/16/2018   Eustachian tube dysfunction, bilateral 02/16/2018   URI (upper respiratory infection) 02/16/2018   Seasonal allergic rhinitis due to pollen 02/16/2018   ILD (interstitial lung disease) (HCC) 01/07/2018   Dyspnea 01/07/2018   Acute respiratory failure (HCC) 01/06/2018   TIA (transient ischemic attack) 01/06/2018   Hypokalemia 12/15/2017   Chronic cough 11/26/2017   Basal cell carcinoma (BCC) of neck 06/07/2017   Anisometropia 06/02/2017   Drusen of macula of both eyes 06/02/2017   Photopsia of left eye 06/02/2017   Cardiac murmur 05/07/2017   Anxiety 05/07/2017   Headache 05/07/2017   Mitral and aortic insufficiency 05/07/2017   Migraine without status migrainosus, not intractable 06/09/2016   Insomnia 11/18/2015   Normal coronary arteries 05/20/2015   RBBB 05/20/2015   Abnormal CT of the chest 05/20/2015   Hx of myocardial infarction 05/08/2015   Bruising 05/08/2015   Upper airway cough syndrome 02/14/2015   Asthmatic bronchitis with acute exacerbation 02/06/2015   Mild intermittent asthma without complication 02/06/2015   Oliguria 12/28/2014   Hypothyroidism 10/25/2014   Fatigue 10/25/2014   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   Chronic venous insufficiency 10/24/2014   Varicose veins of leg  with complications 10/24/2014   Degeneration of intervertebral disc of cervical region 06/29/2014   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   Generalized osteoarthritis of multiple sites 06/29/2014   History of stroke without residual deficits 06/29/2014   Palpitations 11/08/2013   Edema of lower extremity 10/03/2013   Hair loss 11/25/2012   Alopecia 11/25/2012   Diverticulitis of large intestine with perforation and abscess 07/14/2012   Hammer toe 06/13/2012   History of stress test 05/21/2011   Hx of echocardiogram 01/27/2010   H/O cardiac catheterization 2004   PCP:  Antonio Cyndee [name], DO Pharmacy:   [id number] - [place], [place] - [address]OSS [address] [place] [address] Phone: [phone] Fax: [phone]     Social Drivers of Health (SDOH) Social History: SDOH Screenings   Food Insecurity: No Food Insecurity (01/24/2024)  Housing: Low Risk  (01/24/2024)  Transportation Needs: No Transportation Needs (01/24/2024)  Utilities: Not At Risk (01/24/2024)  Depression (  PHQ2-9): Low Risk  (06/04/2022)  Social Connections: Unknown (01/24/2024)  Tobacco Use: Low Risk  (01/24/2024)  Recent Concern: Tobacco Use - Medium Risk (01/05/2024)   Received from [facility]   SDOH Interventions:     Readmission Risk Interventions    11/20/2022    9:47 AM 04/08/2022    2:29 PM 04/03/2022    8:29 AM  Readmission Risk Prevention Plan  Post Dischage Appt   Complete  Medication Screening   Complete  Transportation Screening Complete Complete Complete  PCP or Specialist Appt within 5-7 Days  Complete   Home Care Screening  Complete   Medication Review (RN CM)  Complete   Medication Review (RN Care Manager) Complete    HRI or Home Care Consult Complete    SW Recovery Care/Counseling Consult Complete    Palliative Care Screening Not Applicable    Skilled Nursing Facility Not Applicable

## 2024-01-26 NOTE — Progress Notes (Signed)
 Heart Failure Navigator Progress Note  Assessed for Heart & Vascular TOC clinic readiness.  Patient does not meet criteria due to EF 60-65%, has a scheduled [facility] appointment on 02/02/2024. No HF TOC. .   Navigator will sign off at this time.   Stephane Haddock, BSN, Scientist, clinical (histocompatibility and immunogenetics) Only

## 2024-01-26 NOTE — Progress Notes (Signed)
 Mobility Specialist Progress Note:   01/26/24 1128  Mobility  Activity Pivoted/transferred to/from BSC  Level of Assistance Contact guard assist, steadying assist  Assistive Device Other (Comment) (HHA)  Distance Ambulated (ft) 6 ft  Activity Response Tolerated fair  Mobility Referral Yes  Mobility visit 1 Mobility  Mobility Specialist Start Time (ACUTE ONLY) 1017  Mobility Specialist Stop Time (ACUTE ONLY) 1043  Mobility Specialist Time Calculation (min) (ACUTE ONLY) 26 min   Received pt in bed and hesitant, but agreeable to mobility. Pt transferred to [facility] w/ [facility]G. Pt c/o dizziness. Pt's BP while lying in bed: 122/ 65 (82). Once returned to bed, pt's BP 125/ 65 (82). Pt's VSS on 4 L/min, however c/o SOB. Personal belongings and call light within reach. All needs met.  Lavanda Pollack Mobility Specialist  Please contact via [employer] or  Rehab Office [phone]

## 2024-01-27 ENCOUNTER — Inpatient Hospital Stay (HOSPITAL_COMMUNITY)

## 2024-01-27 DIAGNOSIS — I5033 Acute on chronic diastolic (congestive) heart failure: Secondary | ICD-10-CM | POA: Diagnosis not present

## 2024-01-27 LAB — RESPIRATORY PANEL BY PCR

## 2024-01-27 LAB — BASIC METABOLIC PANEL WITH GFR
Anion gap: 10 (ref 5–15)
BUN: 16 mg/dL (ref 8–23)
CO2: 30 mmol/L (ref 22–32)
Calcium: 8.6 mg/dL — ABNORMAL LOW (ref 8.9–10.3)
Chloride: 93 mmol/L — ABNORMAL LOW (ref 98–111)
Creatinine, Ser: 1.04 mg/dL — ABNORMAL HIGH (ref 0.44–1.00)
GFR, Estimated: 55 mL/min — ABNORMAL LOW (ref >60)
Glucose, Bld: 115 mg/dL — ABNORMAL HIGH (ref 70–99)
Potassium: 3.7 mmol/L (ref 3.5–5.1)
Sodium: 133 mmol/L — ABNORMAL LOW (ref 135–145)

## 2024-01-27 LAB — PROCALCITONIN: Procalcitonin: <0.1 ng/mL

## 2024-01-27 MED ORDER — FUROSEMIDE 10 MG/ML IJ SOLN
40.0000 mg | Freq: Two times a day (BID) | INTRAMUSCULAR | Status: AC
  Administered 2024-01-27: 40 mg via INTRAVENOUS
  Filled 2024-01-27: qty 4

## 2024-01-27 NOTE — Progress Notes (Addendum)
 PROGRESS NOTE    Alexandria [name]  FMW:[id number] DOB: 02-Sep-1945 DOA: 01/24/2024 PCP: Alexandria [name]   78/F w IPF, chronic HFpEF, hypothyroidism, IBS, GERD, and history of TIA who presents with shortness of breath and cough over the past 3d, + chest discomfort. In ED, hypoxic, 85% on room air with tachypnea, Chest x-ray shows chronic ILD changes without acute findings.  Labs are notable for normal CMP, normal CBC, negative COVID, influenza, and RSV PCR, troponin 58, and proBNP 5530. - Admitted, started on diuretics  Subjective: - Still short of breath, cough, only mild improvement in breathing  Assessment and Plan:  Acute on chronic HFpEF; acute hypoxic respiratory failure  - EF was 60-65% with question of severe AI on echo from September 2025  - Pro-BNP much higher than prior - Volume status has improved, still with dyspnea and cough, does not appear overloaded at this time, hold further IV Lasix  after p.m. dose, continue aldactone   -Will obtain HRCT chest, suspect IPF is significant contributor to symptoms - Increase activity, attempt to wean O2  Moderate to severe aortic regurgitation -Noted on echo from 9/25, followed by cardiology recommended continued surveillance for now   IPF (idiopathic pulmonary fibrosis) (HCC) -FU HRCT, check procalcitonin, respiratory virus panel -Continue Bronchodilator therapy and inhaled steroids.  -wean O2   Hypertension  - Losartan       Hypothyroidism  - Synthroid     DVT prophylaxis: lovenox  Code Status: full Family Communication: None present Disposition Plan: Home likely 1 to 2 days    Objective: Vitals:   01/27/24 0344 01/27/24 0345 01/27/24 0735 01/27/24 0747  BP: 121/80  (!) 143/77   Pulse: 71  68 78  Resp: 18  17 19   Temp: (!) 97.4 F (36.3 C)  (!) 97.5 F (36.4 C)   TempSrc: Oral  Oral   SpO2:  97% 100%   Weight:      Height:        Intake/Output Summary (Last 24 hours) at 01/27/2024 1005 Last data  filed at 01/27/2024 0926 Gross per 24 hour  Intake 703 ml  Output 1600 ml  Net -897 ml   Filed Weights   01/25/24 0130 01/26/24 0400 01/27/24 0336  Weight: 54.4 kg 54.9 kg 56.3 kg    Examination:  Gen: Awake, Alert, Oriented X 3,  HEENT: no JVD Lungs: Diffuse bilateral rales CVS: S1S2/RRR Abd: soft, Non tender, non distended, BS present Extremities: No edema Skin: no new rashes on exposed skin     Data Reviewed:   CBC: Recent Labs  Lab 01/24/24 1529 01/25/24 0218  WBC 7.1 7.0  NEUTROABS 4.2  --   HGB 14.3 12.8  HCT 43.6 39.3  MCV 96.9 98.5  PLT 269 215   Basic Metabolic Panel: Recent Labs  Lab 01/24/24 1529 01/25/24 0218 01/26/24 0250 01/27/24 0225  NA 139 136 136 133*  K 3.8 3.8 3.8 3.7  CL 99 98 98 93*  CO2 27 29 28 30   GLUCOSE 130* 97 160* 115*  BUN 11 11 15 16   CREATININE 0.87 0.89 1.00 1.04*  CALCIUM  9.5 8.4* 8.7* 8.6*  MG  --  2.1 2.0  --    GFR: Estimated Creatinine Clearance: 37.9 mL/min (A) (by C-G formula based on SCr of 1.04 mg/dL (H)). Liver Function Tests: Recent Labs  Lab 01/24/24 1529  AST 22  ALT 6  ALKPHOS 96  BILITOT 1.0  PROT 7.8  ALBUMIN  4.1   No results for input(s): LIPASE, AMYLASE  in the last 168 hours. No results for input(s): AMMONIA in the last 168 hours. Coagulation Profile: No results for input(s): INR, PROTIME in the last 168 hours. Cardiac Enzymes: No results for input(s): CKTOTAL, CKMB, CKMBINDEX, TROPONINI in the last 168 hours. BNP (last 3 results) Recent Labs    08/25/23 0644 01/24/24 1529  PROBNP 713.0* 5,530.0*   HbA1C: No results for input(s): HGBA1C in the last 72 hours. CBG: No results for input(s): GLUCAP in the last 168 hours. Lipid Profile: No results for input(s): CHOL, HDL, LDLCALC, TRIG, CHOLHDL, LDLDIRECT in the last 72 hours. Thyroid  Function Tests: No results for input(s): TSH, T4TOTAL, FREET4, T3FREE, THYROIDAB in the last 72  hours. Anemia Panel: No results for input(s): VITAMINB12, FOLATE, FERRITIN, TIBC, IRON , RETICCTPCT in the last 72 hours. Urine analysis:    Component Value Date/Time   COLORURINE YELLOW 08/28/2022 1651   APPEARANCEUR CLEAR 08/28/2022 1651   LABSPEC 1.015 08/28/2022 1651   PHURINE 5.5 08/28/2022 1651   GLUCOSEU NEGATIVE 08/28/2022 1651   HGBUR NEGATIVE 08/28/2022 1651   BILIRUBINUR small 04/23/2023 1437   KETONESUR NEGATIVE 08/28/2022 1651   PROTEINUR Negative 04/23/2023 1437   PROTEINUR NEGATIVE 08/28/2022 1651   UROBILINOGEN 0.2 04/23/2023 1437   NITRITE Negative 04/23/2023 1437   NITRITE NEGATIVE 08/28/2022 1651   LEUKOCYTESUR Trace (A) 04/23/2023 1437   LEUKOCYTESUR NEGATIVE 08/28/2022 1651   Sepsis Labs: @LABRCNTIP (procalcitonin:4,lacticidven:4)  ) Recent Results (from the past 240 hours)  Resp panel by RT-PCR (RSV, Flu A&B, Covid) Anterior Nasal Swab     Status: None   Collection Time: 01/24/24  3:29 PM   Specimen: Anterior Nasal Swab  Result Value Ref Range Status   SARS Coronavirus 2 by RT PCR NEGATIVE NEGATIVE Final    Comment: (NOTE) SARS-CoV-2 target nucleic acids are NOT DETECTED.  The SARS-CoV-2 RNA is generally detectable in upper respiratory specimens during the acute phase of infection. The lowest concentration of SARS-CoV-2 viral copies this assay can detect is 138 copies/mL. A negative result does not preclude SARS-Cov-2 infection and should not be used as the sole basis for treatment or other patient management decisions. A negative result may occur with  improper specimen collection/handling, submission of specimen other than nasopharyngeal swab, presence of viral mutation(s) within the areas targeted by this assay, and inadequate number of viral copies(<138 copies/mL). A negative result must be combined with clinical observations, patient history, and epidemiological information. The expected result is Negative.  Fact Sheet for Patients:   BloggerCourse.com  Fact Sheet for Healthcare Providers:  SeriousBroker.it  This test is no t yet approved or cleared by the [place]  FDA and  has been authorized for detection and/or diagnosis of SARS-CoV-2 by FDA under an Emergency Use Authorization (EUA). This EUA will remain  in effect (meaning this test can be used) for the duration of the COVID-19 declaration under Section 564(b)(1) of the Act, 21 U.S.C.section 360bbb-3(b)(1), unless the authorization is terminated  or revoked sooner.       Influenza A by PCR NEGATIVE NEGATIVE Final   Influenza B by PCR NEGATIVE NEGATIVE Final    Comment: (NOTE) The Xpert Xpress SARS-CoV-2/FLU/RSV plus assay is intended as an aid in the diagnosis of influenza from Nasopharyngeal swab specimens and should not be used as a sole basis for treatment. Nasal washings and aspirates are unacceptable for Xpert Xpress SARS-CoV-2/FLU/RSV testing.  Fact Sheet for Patients: BloggerCourse.com  Fact Sheet for Healthcare Providers: SeriousBroker.it  This test is not yet approved or cleared by the  [place]  FDA and has been authorized for detection and/or diagnosis of SARS-CoV-2 by FDA under an Emergency Use Authorization (EUA). This EUA will remain in effect (meaning this test can be used) for the duration of the COVID-19 declaration under Section 564(b)(1) of the Act, 21 U.S.C. section 360bbb-3(b)(1), unless the authorization is terminated or revoked.     Resp Syncytial Virus by PCR NEGATIVE NEGATIVE Final    Comment: (NOTE) Fact Sheet for Patients: BloggerCourse.com  Fact Sheet for Healthcare Providers: SeriousBroker.it  This test is not yet approved or cleared by the [place]  FDA and has been authorized for detection and/or diagnosis of SARS-CoV-2 by FDA under an Emergency Use  Authorization (EUA). This EUA will remain in effect (meaning this test can be used) for the duration of the COVID-19 declaration under Section 564(b)(1) of the Act, 21 U.S.C. section 360bbb-3(b)(1), unless the authorization is terminated or revoked.  Performed at [facility], [address], [place], [place] [address]      Radiology Studies: No results found.    Scheduled Meds:  budesonide  (PULMICORT ) nebulizer solution  0.25 mg Nebulization BID   colestipol   2 g Oral BID   enoxaparin  (LOVENOX ) injection  40 mg Subcutaneous Daily   furosemide   40 mg Intravenous BID   levothyroxine   112 mcg Oral QAC breakfast   pantoprazole   40 mg Oral Daily   rosuvastatin  20 mg Oral Daily   sodium chloride  flush  3 mL Intravenous Q12H   spironolactone   25 mg Oral Daily   Continuous Infusions:   LOS: 3 days    Time spent:    Sigurd Pac, MD [facility]   01/27/2024, 10:05 AM

## 2024-01-27 NOTE — Plan of Care (Signed)
  Problem: Education: Goal: Knowledge of General Education information will improve Description: Including pain rating scale, medication(s)/side effects and non-pharmacologic comfort measures Outcome: Progressing   Problem: Health Behavior/Discharge Planning: Goal: Ability to manage health-related needs will improve Outcome: Progressing   Problem: Clinical Measurements: Goal: Ability to maintain clinical measurements within normal limits will improve Outcome: Progressing Goal: Will remain free from infection Outcome: Progressing Goal: Diagnostic test results will improve Outcome: Progressing Goal: Cardiovascular complication will be avoided Outcome: Progressing   Problem: Activity: Goal: Risk for activity intolerance will decrease Outcome: Progressing   Problem: Nutrition: Goal: Adequate nutrition will be maintained Outcome: Progressing   Problem: Coping: Goal: Level of anxiety will decrease Outcome: Progressing   Problem: Elimination: Goal: Will not experience complications related to bowel motility Outcome: Progressing Goal: Will not experience complications related to urinary retention Outcome: Progressing   Problem: Pain Managment: Goal: General experience of comfort will improve and/or be controlled Outcome: Progressing   Problem: Safety: Goal: Ability to remain free from injury will improve Outcome: Progressing   Problem: Skin Integrity: Goal: Risk for impaired skin integrity will decrease Outcome: Progressing   Problem: Education: Goal: Ability to demonstrate management of disease process will improve Outcome: Progressing Goal: Ability to verbalize understanding of medication therapies will improve Outcome: Progressing   Problem: Activity: Goal: Capacity to carry out activities will improve Outcome: Progressing   Problem: Cardiac: Goal: Ability to achieve and maintain adequate cardiopulmonary perfusion will improve Outcome: Progressing   Problem:  Clinical Measurements: Goal: Respiratory complications will improve Outcome: Not Progressing

## 2024-01-27 NOTE — Plan of Care (Signed)

## 2024-01-27 NOTE — Progress Notes (Signed)
 Mobility Specialist Progress Note:    01/27/24 1016  Mobility  Activity Ambulated with assistance  Level of Assistance Standby assist, set-up cues, supervision of patient - no hands on  Assistive Device Other (Comment) (HHA)  Distance Ambulated (ft) 10 ft  Activity Response Tolerated fair  Mobility Referral Yes  Mobility visit 1 Mobility  Mobility Specialist Start Time (ACUTE ONLY) 1016  Mobility Specialist Stop Time (ACUTE ONLY) 1030  Mobility Specialist Time Calculation (min) (ACUTE ONLY) 14 min   Received pt as she was returning back from procedure agreeable to small walk. C/o feeling SOB. Pt able to stand on own and ambulate w/ HHA. Pt on 2L sat at 98% in bed. Left pt in room w/ all needs met requesting to ambulate in afternoon.   Venetia Keel Mobility Specialist Please Neurosurgeon or Rehab Office at [phone]

## 2024-01-28 DIAGNOSIS — I5033 Acute on chronic diastolic (congestive) heart failure: Secondary | ICD-10-CM | POA: Diagnosis not present

## 2024-01-28 DIAGNOSIS — J8410 Pulmonary fibrosis, unspecified: Secondary | ICD-10-CM

## 2024-01-28 DIAGNOSIS — I351 Nonrheumatic aortic (valve) insufficiency: Secondary | ICD-10-CM

## 2024-01-28 LAB — BASIC METABOLIC PANEL WITH GFR
Anion gap: 10 (ref 5–15)
BUN: 15 mg/dL (ref 8–23)
CO2: 32 mmol/L (ref 22–32)
Calcium: 8.7 mg/dL — ABNORMAL LOW (ref 8.9–10.3)
Chloride: 93 mmol/L — ABNORMAL LOW (ref 98–111)
Creatinine, Ser: 1.04 mg/dL — ABNORMAL HIGH (ref 0.44–1.00)
GFR, Estimated: 55 mL/min — ABNORMAL LOW (ref >60)
Glucose, Bld: 112 mg/dL — ABNORMAL HIGH (ref 70–99)
Potassium: 3.5 mmol/L (ref 3.5–5.1)
Sodium: 135 mmol/L (ref 135–145)

## 2024-01-28 MED ORDER — LOPERAMIDE HCL 2 MG PO CAPS
2.0000 mg | ORAL_CAPSULE | Freq: Four times a day (QID) | ORAL | Status: DC | PRN
  Administered 2024-01-28 – 2024-01-30 (×3): 2 mg via ORAL
  Filled 2024-01-28 (×3): qty 1

## 2024-01-28 MED ORDER — FUROSEMIDE 40 MG PO TABS
40.0000 mg | ORAL_TABLET | Freq: Every day | ORAL | Status: DC
  Administered 2024-01-28 – 2024-01-30 (×3): 40 mg via ORAL
  Filled 2024-01-28 (×3): qty 1

## 2024-01-28 MED ORDER — LOPERAMIDE HCL 2 MG PO CAPS
2.0000 mg | ORAL_CAPSULE | Freq: Two times a day (BID) | ORAL | Status: DC | PRN
  Administered 2024-01-28: 2 mg via ORAL
  Filled 2024-01-28: qty 1

## 2024-01-28 MED ORDER — LOPERAMIDE HCL 2 MG PO CAPS: 2.0000 mg | ORAL_CAPSULE | Freq: Two times a day (BID) | ORAL | Status: DC

## 2024-01-28 MED ORDER — GERHARDT'S BUTT CREAM
TOPICAL_CREAM | Freq: Two times a day (BID) | CUTANEOUS | Status: DC
  Filled 2024-01-28: qty 60

## 2024-01-28 MED ORDER — ONDANSETRON HCL 4 MG/2ML IJ SOLN
4.0000 mg | Freq: Four times a day (QID) | INTRAMUSCULAR | Status: DC | PRN
  Administered 2024-01-28: 4 mg via INTRAVENOUS
  Filled 2024-01-28: qty 2

## 2024-01-28 MED ORDER — ALPRAZOLAM 0.25 MG PO TABS
0.2500 mg | ORAL_TABLET | Freq: Two times a day (BID) | ORAL | Status: DC | PRN
  Administered 2024-01-28 – 2024-01-29 (×2): 0.25 mg via ORAL
  Filled 2024-01-28 (×2): qty 1

## 2024-01-28 NOTE — Plan of Care (Signed)
  Problem: Activity: Goal: Risk for activity intolerance will decrease Outcome: Not Progressing   Problem: Coping: Goal: Level of anxiety will decrease Outcome: Not Progressing   Problem: Elimination: Goal: Will not experience complications related to bowel motility Outcome: Not Progressing

## 2024-01-28 NOTE — Progress Notes (Signed)
 Mobility Specialist Progress Note:    01/28/24 1443  Mobility  Activity Dangled on edge of bed;Stood at bedside  Level of Assistance Contact guard assist, steadying assist  Assistive Device Other (Comment) (HHA)  Range of Motion/Exercises Active Assistive  Activity Response Tolerated fair  Mobility Referral Yes  Mobility visit 1 Mobility  Mobility Specialist Start Time (ACUTE ONLY) 1443  Mobility Specialist Stop Time (ACUTE ONLY) 1458  Mobility Specialist Time Calculation (min) (ACUTE ONLY) 15 min   Received pt in bed requesting us  to work with her. Limited d/t high anxiety and pt not wanting to go far. Similar c/o as previous session. Pt able to sit EOB and do some stands before saying she can't do anymore. Returned pt to bed w/ all needs met. RN notified.   Venetia Keel Mobility Specialist Please Neurosurgeon or Rehab Office at [phone]

## 2024-01-28 NOTE — Progress Notes (Addendum)
 PROGRESS NOTE    Alexandria [name]  FMW:[id number] DOB: 1946-02-02 DOA: 01/24/2024 PCP: Antonio Cyndee [name], DO   78/F w IPF, chronic HFpEF, hypothyroidism, IBS, GERD, and history of TIA who presents with shortness of breath and cough over the past 3d, + chest discomfort. In ED, hypoxic, 85% on room air with tachypnea, Chest x-ray shows chronic ILD changes without acute findings.  Labs are notable for normal CMP, normal CBC, negative COVID, influenza, and RSV PCR, troponin 58, and proBNP 5530. - Admitted, started on diuretics  Subjective: - Still short of breath, cough, only mild improvement in breathing  Assessment and Plan:  Acute on chronic HFpEF; acute hypoxic respiratory failure  - EF was 60-65% with question of severe AI on echo from September 2025  - Pro-BNP much higher than prior - Diuresed X 3 days, volume status has improved however still with some dyspnea and cough at rest and significant with any activity  -Switch to oral Lasix  today, continue Aldactone  suspect - Underlying pulmonary fibrosis contributing to her symptoms, HRCT done, concerning for progressive disease, will request pulmonary input - Increase activity, attempt to wean O2  Moderate to severe aortic regurgitation -Noted on echo from 9/25, followed by cardiology recommended continued surveillance for now   IPF (idiopathic pulmonary fibrosis) (HCC) - HRCT suggestive of progressive disease, respiratory virus panel is negative, procalcitonin <0.1 -Pulmonary consult requested, may need steroids -Continue Bronchodilator therapy and inhaled steroids.  -wean O2   Hypertension  - Stable, hold losartan       Hypothyroidism  - Synthroid     DVT prophylaxis: lovenox  Code Status: full Family Communication: None present Disposition Plan: Home likely 1 to 2 days    Objective: Vitals:   01/28/24 0053 01/28/24 0408 01/28/24 0755 01/28/24 0826  BP:  139/72 (!) 141/80   Pulse: 78 74 73   Resp:  20 18   Temp:   97.6 F (36.4 C) (!) 97.4 F (36.3 C)   TempSrc:  Oral Oral   SpO2: 99% 97% 99% 99%  Weight:  55.3 kg    Height:        Intake/Output Summary (Last 24 hours) at 01/28/2024 1009 Last data filed at 01/28/2024 0451 Gross per 24 hour  Intake 720 ml  Output 1050 ml  Net -330 ml   Filed Weights   01/26/24 0400 01/27/24 0336 01/28/24 0408  Weight: 54.9 kg 56.3 kg 55.3 kg    Examination:  Gen: Awake, Alert, Oriented X 3,  HEENT: no JVD Lungs: Diffuse bilateral rales CVS: S1S2/RRR Abd: soft, Non tender, non distended, BS present Extremities: No edema Skin: no new rashes on exposed skin     Data Reviewed:   CBC: Recent Labs  Lab 01/24/24 1529 01/25/24 0218  WBC 7.1 7.0  NEUTROABS 4.2  --   HGB 14.3 12.8  HCT 43.6 39.3  MCV 96.9 98.5  PLT 269 215   Basic Metabolic Panel: Recent Labs  Lab 01/24/24 1529 01/25/24 0218 01/26/24 0250 01/27/24 0225 01/28/24 0231  NA 139 136 136 133* 135  K 3.8 3.8 3.8 3.7 3.5  CL 99 98 98 93* 93*  CO2 27 29 28 30  32  GLUCOSE 130* 97 160* 115* 112*  BUN 11 11 15 16 15   CREATININE 0.87 0.89 1.00 1.04* 1.04*  CALCIUM  9.5 8.4* 8.7* 8.6* 8.7*  MG  --  2.1 2.0  --   --    GFR: Estimated Creatinine Clearance: 37.9 mL/min (A) (by C-G formula based on SCr  of 1.04 mg/dL (H)). Liver Function Tests: Recent Labs  Lab 01/24/24 1529  AST 22  ALT 6  ALKPHOS 96  BILITOT 1.0  PROT 7.8  ALBUMIN  4.1   No results for input(s): LIPASE, AMYLASE in the last 168 hours. No results for input(s): AMMONIA in the last 168 hours. Coagulation Profile: No results for input(s): INR, PROTIME in the last 168 hours. Cardiac Enzymes: No results for input(s): CKTOTAL, CKMB, CKMBINDEX, TROPONINI in the last 168 hours. BNP (last 3 results) Recent Labs    08/25/23 0644 01/24/24 1529  PROBNP 713.0* 5,530.0*   HbA1C: No results for input(s): HGBA1C in the last 72 hours. CBG: No results for input(s): GLUCAP in the last 168  hours. Lipid Profile: No results for input(s): CHOL, HDL, LDLCALC, TRIG, CHOLHDL, LDLDIRECT in the last 72 hours. Thyroid  Function Tests: No results for input(s): TSH, T4TOTAL, FREET4, T3FREE, THYROIDAB in the last 72 hours. Anemia Panel: No results for input(s): VITAMINB12, FOLATE, FERRITIN, TIBC, IRON , RETICCTPCT in the last 72 hours. Urine analysis:    Component Value Date/Time   COLORURINE YELLOW 08/28/2022 1651   APPEARANCEUR CLEAR 08/28/2022 1651   LABSPEC 1.015 08/28/2022 1651   PHURINE 5.5 08/28/2022 1651   GLUCOSEU NEGATIVE 08/28/2022 1651   HGBUR NEGATIVE 08/28/2022 1651   BILIRUBINUR small 04/23/2023 1437   KETONESUR NEGATIVE 08/28/2022 1651   PROTEINUR Negative 04/23/2023 1437   PROTEINUR NEGATIVE 08/28/2022 1651   UROBILINOGEN 0.2 04/23/2023 1437   NITRITE Negative 04/23/2023 1437   NITRITE NEGATIVE 08/28/2022 1651   LEUKOCYTESUR Trace (A) 04/23/2023 1437   LEUKOCYTESUR NEGATIVE 08/28/2022 1651   Sepsis Labs: @LABRCNTIP (procalcitonin:4,lacticidven:4)  ) Recent Results (from the past 240 hours)  Resp panel by RT-PCR (RSV, Flu A&B, Covid) Anterior Nasal Swab     Status: None   Collection Time: 01/24/24  3:29 PM   Specimen: Anterior Nasal Swab  Result Value Ref Range Status   SARS Coronavirus 2 by RT PCR NEGATIVE NEGATIVE Final    Comment: (NOTE) SARS-CoV-2 target nucleic acids are NOT DETECTED.  The SARS-CoV-2 RNA is generally detectable in upper respiratory specimens during the acute phase of infection. The lowest concentration of SARS-CoV-2 viral copies this assay can detect is 138 copies/mL. A negative result does not preclude SARS-Cov-2 infection and should not be used as the sole basis for treatment or other patient management decisions. A negative result may occur with  improper specimen collection/handling, submission of specimen other than nasopharyngeal swab, presence of viral mutation(s) within the areas targeted by  this assay, and inadequate number of viral copies(<138 copies/mL). A negative result must be combined with clinical observations, patient history, and epidemiological information. The expected result is Negative.  Fact Sheet for Patients:  BloggerCourse.com  Fact Sheet for Healthcare Providers:  SeriousBroker.it  This test is no t yet approved or cleared by the [place]  FDA and  has been authorized for detection and/or diagnosis of SARS-CoV-2 by FDA under an Emergency Use Authorization (EUA). This EUA will remain  in effect (meaning this test can be used) for the duration of the COVID-19 declaration under Section 564(b)(1) of the Act, 21 U.S.C.section 360bbb-3(b)(1), unless the authorization is terminated  or revoked sooner.       Influenza A by PCR NEGATIVE NEGATIVE Final   Influenza B by PCR NEGATIVE NEGATIVE Final    Comment: (NOTE) The Xpert Xpress SARS-CoV-2/FLU/RSV plus assay is intended as an aid in the diagnosis of influenza from Nasopharyngeal swab specimens and should not be used as a  sole basis for treatment. Nasal washings and aspirates are unacceptable for Xpert Xpress SARS-CoV-2/FLU/RSV testing.  Fact Sheet for Patients: BloggerCourse.com  Fact Sheet for Healthcare Providers: SeriousBroker.it  This test is not yet approved or cleared by the [place]  FDA and has been authorized for detection and/or diagnosis of SARS-CoV-2 by FDA under an Emergency Use Authorization (EUA). This EUA will remain in effect (meaning this test can be used) for the duration of the COVID-19 declaration under Section 564(b)(1) of the Act, 21 U.S.C. section 360bbb-3(b)(1), unless the authorization is terminated or revoked.     Resp Syncytial Virus by PCR NEGATIVE NEGATIVE Final    Comment: (NOTE) Fact Sheet for Patients: BloggerCourse.com  Fact Sheet  for Healthcare Providers: SeriousBroker.it  This test is not yet approved or cleared by the [place]  FDA and has been authorized for detection and/or diagnosis of SARS-CoV-2 by FDA under an Emergency Use Authorization (EUA). This EUA will remain in effect (meaning this test can be used) for the duration of the COVID-19 declaration under Section 564(b)(1) of the Act, 21 U.S.C. section 360bbb-3(b)(1), unless the authorization is terminated or revoked.  Performed at [facility], [address] [place], [place] [address]   Respiratory (~20 pathogens) panel by PCR     Status: None   Collection Time: 01/27/24  8:46 AM   Specimen: Nasopharyngeal Swab; Respiratory  Result Value Ref Range Status   Adenovirus NOT DETECTED NOT DETECTED Final   Coronavirus 229E NOT DETECTED NOT DETECTED Final    Comment: (NOTE) The Coronavirus on the Respiratory Panel, DOES NOT test for the novel  Coronavirus (2019 nCoV)    Coronavirus HKU1 NOT DETECTED NOT DETECTED Final   Coronavirus NL63 NOT DETECTED NOT DETECTED Final   Coronavirus OC43 NOT DETECTED NOT DETECTED Final   Metapneumovirus NOT DETECTED NOT DETECTED Final   Rhinovirus / Enterovirus NOT DETECTED NOT DETECTED Final   Influenza A NOT DETECTED NOT DETECTED Final   Influenza B NOT DETECTED NOT DETECTED Final   Parainfluenza Virus 1 NOT DETECTED NOT DETECTED Final   Parainfluenza Virus 2 NOT DETECTED NOT DETECTED Final   Parainfluenza Virus 3 NOT DETECTED NOT DETECTED Final   Parainfluenza Virus 4 NOT DETECTED NOT DETECTED Final   Respiratory Syncytial Virus NOT DETECTED NOT DETECTED Final   Bordetella pertussis NOT DETECTED NOT DETECTED Final   Bordetella Parapertussis NOT DETECTED NOT DETECTED Final   Chlamydophila pneumoniae NOT DETECTED NOT DETECTED Final   Mycoplasma pneumoniae NOT DETECTED NOT DETECTED Final    Comment: Performed at [facility], [address], [place], [place] [address]      Radiology Studies: CT Chest High Resolution Result Date: 01/28/2024 CLINICAL DATA:  Hypoxemia. EXAM: CT CHEST WITHOUT CONTRAST TECHNIQUE: Multidetector CT imaging of the chest was performed following the standard protocol without intravenous contrast. High resolution imaging of the lungs, as well as inspiratory and expiratory imaging, was performed. RADIATION DOSE REDUCTION: This exam was performed according to the departmental dose-optimization program which includes automated exposure control, adjustment of the mA and/or kV according to patient size and/or use of iterative reconstruction technique. COMPARISON:  08/25/2023, 05/18/2023, 12/10/2021. FINDINGS: Cardiovascular: Atherosclerotic calcification of the aorta, aortic valve and coronary arteries. Ascending aorta measures 4.3 cm (coronal image 31). Enlarged pulmonic trunk and heart. No pericardial effusion. Mediastinum/Nodes: Mediastinal lymph nodes measure up to 11 mm in the low right paratracheal station and are considered reactive in etiology given interstitial lung disease. Hilar regions are difficult to definitively  evaluate without IV contrast. No axillary adenopathy. Air and fluid are seen in the esophagus, suggesting dysmotility. Prominent cisterna chyli. Lungs/Pleura: Peripheral and basilar predominant subpleural reticulation, consolidation, ground-glass and traction bronchiectasis/bronchiolectasis, markedly progressive from 12/11/2023. There is somewhat of a micro nodular upper and midlung zone predominant pattern with nodularity along the fissures. No pleural fluid. Debris in the airway. No air trapping. Upper Abdomen: Visualized portions of the liver, gallbladder, adrenal glands, kidneys, spleen, pancreas, stomach and bowel are grossly unremarkable. No upper abdominal adenopathy. Musculoskeletal: Degenerative changes in the spine. IMPRESSION: 1. 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 2. Upper and midlung zone predominant perilymphatic micronodularity. Difficult to exclude superimposed sarcoid. 3. 4.3 cm ascending aortic aneurysm, stable. Recommend annual imaging followup by CTA or MRA. This recommendation follows 2010 ACCF/AHA/AATS/ACR/ASA/SCA/SCAI/SIR/STS/SVM Guidelines for the Diagnosis and Management of Patients with Thoracic Aortic Disease. Circulation. 2010; 121: Z733-z630. Aortic aneurysm NOS (ICD10-I71.9). 4. Aortic atherosclerosis (ICD10-I70.0). Coronary artery calcification. 5. Enlarged pulmonic trunk, indicative of pulmonary arterial hypertension. Electronically Signed   By: Newell Eke M.D.   On: 01/28/2024 09:47      Scheduled Meds:  budesonide  (PULMICORT ) nebulizer solution  0.25 mg Nebulization BID   colestipol   2 g Oral BID   enoxaparin  (LOVENOX ) injection  40 mg Subcutaneous Daily   levothyroxine   112 mcg Oral QAC breakfast   pantoprazole   40 mg Oral Daily   rosuvastatin  20 mg Oral Daily   sodium chloride  flush  3 mL Intravenous Q12H   spironolactone   25 mg Oral Daily   Continuous Infusions:   LOS: 4 days    Time spent:    Sigurd Pac, MD [facility]   01/28/2024, 10:09 AM

## 2024-01-28 NOTE — Plan of Care (Signed)

## 2024-01-28 NOTE — Progress Notes (Signed)
 Mobility Specialist Progress Note:    01/28/24 1040  Mobility  Activity Pivoted/transferred to/from [facility]  Level of Assistance Contact guard assist, steadying assist  Assistive Device Other (Comment) (HHA)  Distance Ambulated (ft) 3 ft  Activity Response Tolerated fair  Mobility Referral Yes  Mobility visit 1 Mobility  Mobility Specialist Start Time (ACUTE ONLY) 1040  Mobility Specialist Stop Time (ACUTE ONLY) 1047  Mobility Specialist Time Calculation (min) (ACUTE ONLY) 7 min   Received pt in bed requesting to be transferred over to [facility]. Pt complaining about being able to control BM d/t medication. Pt has high anxiety that would benefit from asking RN to give anxiety meds before beginning session. Returned pt to bed w/ all needs met.   Venetia Keel Mobility Specialist Please Neurosurgeon or Rehab Office at [phone]

## 2024-01-28 NOTE — Consult Note (Signed)
 Name: Alexandria [name] MRN: [id number] DOB: 11-03-1945    ADMISSION DATE:  01/24/2024  HISTORY OF PRESENT ILLNESS:   78 YO with female with PMHX pf IPF on ofev  (use it twice a week) follow up at [facility], hypothyroidism, IBS, chronic HFpEF who came for evaluation of shortness of breath and cough over the last 3 days. At the ED she was hypoxic to 85%, and she require 2L of oxygen. Chest CT scan showed pulmonary parenchymal pattern of fibrotic ILD progressive from 12/2021.  Her autoimmune work up based on [facility] have been negative. She has not received prednisone  in the past. She is not compliance with ofev , due to multiple diarrheas and weigh loss.   She denied fever, flu like symptoms. RSVP neg, procal neg. proBNP elevated.  PAST MEDICAL HISTORY :   has a past medical history of Allergy , Arthritis, Blood transfusion without reported diagnosis, Bronchial pneumonia, Cataract, Chronic facial pain (08/14/2022), Colon polyp, Diverticulitis, DVT (deep venous thrombosis) (HCC), H/O cardiac catheterization (2004), Heart murmur, History of stress test (05/21/2011), echocardiogram (01/27/2010), Hypothyroidism, IPF (idiopathic pulmonary fibrosis) (HCC) (01/2018), Myocardial infarction (HCC), Pancreatitis, and TIA (transient ischemic attack).  has a past surgical history that includes Vaginal hysterectomy (10/17/1998); Breast biopsy (Left); Total knee arthroplasty; Neck surgery; Cardiac catheterization; left heart catheterization with coronary angiogram (N/A, 10/25/2013); Video bronchoscopy (Bilateral, 01/24/2018); Cataract extraction (Bilateral, 03/22/2018); XI robotic assisted lower anterior resection (N/A, 03/31/2022); Xi robotic assisted colostomy takedown (N/A, 08/13/2022); Lysis of adhesion (N/A, 08/13/2022); Flexible sigmoidoscopy (N/A, 08/13/2022); Ileostomy closure (N/A, 11/19/2022); and Rhinoplasty (08/16/2023). Prior to Admission medications   Medication Sig Start Date End Date Taking?  Authorizing Provider  azelastine  (ASTELIN ) 0.1 % nasal spray Place 1 spray into both nostrils daily as needed for rhinitis. Use in each nostril as directed   Yes [provider]  benzonatate  (TESSALON ) 200 MG capsule Take 1 capsule (200 mg total) by mouth 3 (three) times daily as needed for cough. 08/28/23  Yes Drusilla Sabas RAMAN, MD  colestipol  (COLESTID ) 1 g tablet Take 2 tablets (2 g total) by mouth 2 (two) times daily. Patient taking differently: Take 1 g by mouth daily as needed (diarrhea). 10/27/23 01/25/24 Yes Federico Rosario BROCKS, MD  diphenoxylate -atropine  (LOMOTIL ) 2.5-0.025 MG tablet Take 2 tablets by mouth 3 (three) times daily as needed for diarrhea or loose stools. 07/07/23  Yes [provider]  esomeprazole  (NEXIUM ) 40 MG capsule Take 1 capsule (40 mg total) by mouth 2 (two) times daily before a meal. Patient taking differently: Take 40 mg by mouth 2 (two) times daily as needed (acid reflux). 01/06/24  Yes Federico Rosario BROCKS, MD  levalbuterol  (XOPENEX ) 1.25 MG/3ML nebulizer solution Take 1.25 mg by nebulization every 6 (six) hours as needed for wheezing or shortness of breath. 09/03/23  Yes Lowne Chase, Yvonne R, DO  Multiple Vitamin (MULTIVITAMIN WITH MINERALS) TABS tablet Take 1 tablet by mouth every Monday, Wednesday, and Friday.   Yes [provider]  ondansetron  (ZOFRAN ) 4 MG tablet Take 1 tablet (4 mg total) by mouth every 8 (eight) hours as needed. 01/10/24  Yes Antonio Cyndee Rockers R, DO  promethazine -dextromethorphan (PROMETHAZINE -DM) 6.25-15 MG/5ML syrup TAKE BY MOUTH 4 TIMES A DAY AS NEEDED 01/11/24  Yes Lowne Chase, Yvonne R, DO  SYNTHROID  112 MCG tablet Take 1 tablet (112 mcg total) by mouth daily before breakfast. 01/04/24  Yes Lowne Chase, Yvonne R, DO  vitamin C  (ASCORBIC ACID ) 500 MG tablet Take 500 mg by mouth every other day.  Yes [provider]  amoxicillin  (AMOXIL ) 875 MG tablet Take 875 mg by mouth 2 (two) times daily. Patient not taking: Reported  on 01/25/2024 01/11/24   [provider]  losartan  (COZAAR ) 25 MG tablet Take 1 tablet (25 mg total) by mouth daily. Patient not taking: Reported on 01/25/2024 11/01/23 01/30/24  Trudy Birmingham, PA-C  Respiratory Therapy Supplies (FULL KIT NEBULIZER SET) MISC Use with DUONEB for COPD treatment 09/03/23   Lowne Chase, Yvonne R, DO  rosuvastatin (CRESTOR) 20 MG tablet Take 1 tablet (20 mg total) by mouth daily. Patient not taking: Reported on 01/25/2024 01/03/24 04/02/24  Croitoru, Jerel, MD   Allergies  Allergen Reactions   Influenza Vaccines Other (See Comments)    Pt reports heart attack after flu shot   Prednisone  Other (See Comments)    High doses caused mental status changes.   Dilaudid  [Hydromorphone ] Itching and Other (See Comments)    Dilaudid  caused marked confusion   Ipratropium Bromide  Itching and Other (See Comments)    Mental Status Changes, Insomnia   Cheratussin Ac [Guaifenesin -Codeine ] Other (See Comments)    Headaches - patient denies   Codeine  Itching, Nausea Only and Other (See Comments)    Pt takes promethazine  with codeine  at home   Diltiazem  Nausea And Vomiting   Hydrocodone Itching   Ibuprofen  Itching and Other (See Comments)    Gives false reading in blood   Morphine  Itching   Oxycodone  Itching    FAMILY HISTORY:  family history includes Arthritis in an other family member; Colon cancer in her maternal aunt; Diabetes in her maternal grandmother; HIV in her brother; Heart disease in her maternal grandmother; Hypertension in her maternal grandmother; Kidney cancer in her brother; Lung cancer in her brother and father; Other in her brother; Ovarian cancer in her mother; Stroke in her maternal grandmother; Uterine cancer in her mother. SOCIAL HISTORY:  reports that she has never smoked. She has never used smokeless tobacco. She reports that she does not drink alcohol  and does not use drugs.  REVIEW OF SYSTEMS:   as above  SUBJECTIVE:  I encounter patient  at RA. Her O2 sats ~88  VITAL SIGNS: Temp:  [97.4 F (36.3 C)-98.8 F (37.1 C)] 98.8 F (37.1 C) (10/24 1900) Pulse Rate:  [73-79] 79 (10/24 1900) Resp:  [18-20] 19 (10/24 1900) BP: (111-141)/(62-82) 111/62 (10/24 1900) SpO2:  [91 %-99 %] 98 % (10/24 1936) Weight:  [55.3 kg] 55.3 kg (10/24 0408)  PHYSICAL EXAMINATION: Physical Exam HENT:     Head: Normocephalic and atraumatic.  Eyes:     Pupils: Pupils are equal, round, and reactive to light.  Cardiovascular:     Rate and Rhythm: Normal rate and regular rhythm.  Pulmonary:     Comments: Some mild tachypnea at RA. Coughing. O2 sat 88%. Crackles in the 1/3 bases.  Abdominal:     General: Bowel sounds are normal.     Palpations: Abdomen is soft.  Musculoskeletal:        General: Normal range of motion.     Comments: No LE edema  Neurological:     General: No focal deficit present.     Mental Status: She is alert and oriented to person, place, and time.      Recent Labs  Lab 01/26/24 0250 01/27/24 0225 01/28/24 0231  NA 136 133* 135  K 3.8 3.7 3.5  CL 98 93* 93*  CO2 28 30 32  BUN 15 16 15   CREATININE 1.00 1.04* 1.04*  GLUCOSE 160* 115* 112*   Recent Labs  Lab 01/24/24 1529 01/25/24 0218  HGB 14.3 12.8  HCT 43.6 39.3  WBC 7.1 7.0  PLT 269 215   CT Chest High Resolution Result Date: 01/28/2024 CLINICAL DATA:  Hypoxemia. EXAM: CT CHEST WITHOUT CONTRAST TECHNIQUE: Multidetector CT imaging of the chest was performed following the standard protocol without intravenous contrast. High resolution imaging of the lungs, as well as inspiratory and expiratory imaging, was performed. RADIATION DOSE REDUCTION: This exam was performed according to the departmental dose-optimization program which includes automated exposure control, adjustment of the mA and/or kV according to patient size and/or use of iterative reconstruction technique. COMPARISON:  08/25/2023, 05/18/2023, 12/10/2021. FINDINGS: Cardiovascular:  Atherosclerotic calcification of the aorta, aortic valve and coronary arteries. Ascending aorta measures 4.3 cm (coronal image 31). Enlarged pulmonic trunk and heart. No pericardial effusion. Mediastinum/Nodes: Mediastinal lymph nodes measure up to 11 mm in the low right paratracheal station and are considered reactive in etiology given interstitial lung disease. Hilar regions are difficult to definitively evaluate without IV contrast. No axillary adenopathy. Air and fluid are seen in the esophagus, suggesting dysmotility. Prominent cisterna chyli. Lungs/Pleura: Peripheral and basilar predominant subpleural reticulation, consolidation, ground-glass and traction bronchiectasis/bronchiolectasis, markedly progressive from 12/11/2023. There is somewhat of a micro nodular upper and midlung zone predominant pattern with nodularity along the fissures. No pleural fluid. Debris in the airway. No air trapping. Upper Abdomen: Visualized portions of the liver, gallbladder, adrenal glands, kidneys, spleen, pancreas, stomach and bowel are grossly unremarkable. No upper abdominal adenopathy. Musculoskeletal: Degenerative changes in the spine. IMPRESSION: 1. 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 2. Upper and midlung zone predominant perilymphatic micronodularity. Difficult to exclude superimposed sarcoid. 3. 4.3 cm ascending aortic aneurysm, stable. Recommend annual imaging followup by CTA or MRA. This recommendation follows 2010 ACCF/AHA/AATS/ACR/ASA/SCA/SCAI/SIR/STS/SVM Guidelines for the Diagnosis and Management of Patients with Thoracic Aortic Disease. Circulation. 2010; 121: [id number]. Aortic aneurysm NOS (ICD10-I71.9). 4. Aortic atherosclerosis (ICD10-I70.0). Coronary artery calcification. 5. Enlarged pulmonic trunk, indicative of pulmonary arterial hypertension.  Electronically Signed   By: Newell Eke M.D.   On: 01/28/2024 09:47   RVP neg  Echo 2025:  EF 60-65%. LV normal fx.  RV is mildly reduced. Normal.  Moderate Mitral annular calcification. Severe AR    ASSESSMENT / PLAN:  Idiopathic pulmonary fibrosis Acute on chronic HFpEF Severe AR  78 yo with history of IPF and HFpEF who came with dyspnea on exertion. Her CT scan has showed progression of IPF, with more GGOs, that could represent some pulm edema. Her infection work up has been negative, RVSP neg, procal neg. She is at RA with O2 sat 88. Unfortunately there are not many treatments for IPF. Some severe flares can be treated with Steroids but I dont consider it is need at this time. She is at RA. I agree to continue diuresis, given some clinical improvement and high ProBNP, although she does look euvolemic.  Regarding to the antifibrotic medication, she is partially compliance with ofev  due to diarrheas and weight loss. She can try Esbriet (less GI side effects), this can be managed as outpatient, it needs insurance authorization. Antifibrotic slow the progression of the disease by reducing the rate of lung function decline in the future (5 years), it does not cure the disease. She is willing to try.  Plan:  -Continue lasix  prn -Assess O2 requirement with  walking, she may need O2 at home. -F/u with her primary pulmonologist. It seems she has an appt with [facility] later this year. She also f/u at [facility] with pulmonary. They may consider to change to Esbriet. I defer to her primary pulmonary.  -I will sign off  If you have any questions, don't hesitate to reach out.  Marny Patch, MD Pulmonary and Critical Care Medicine [facility] Pager: [phone]  01/28/2024, 9:40 PM

## 2024-01-29 DIAGNOSIS — I5033 Acute on chronic diastolic (congestive) heart failure: Secondary | ICD-10-CM | POA: Diagnosis not present

## 2024-01-29 LAB — COMPREHENSIVE METABOLIC PANEL WITH GFR
ALT: 9 U/L (ref 0–44)
AST: 20 U/L (ref 15–41)
Albumin: 3 g/dL — ABNORMAL LOW (ref 3.5–5.0)
Alkaline Phosphatase: 59 U/L (ref 38–126)
Anion gap: 11 (ref 5–15)
BUN: 12 mg/dL (ref 8–23)
CO2: 32 mmol/L (ref 22–32)
Calcium: 8.9 mg/dL (ref 8.9–10.3)
Chloride: 91 mmol/L — ABNORMAL LOW (ref 98–111)
Creatinine, Ser: 1.02 mg/dL — ABNORMAL HIGH (ref 0.44–1.00)
GFR, Estimated: 56 mL/min — ABNORMAL LOW (ref >60)
Glucose, Bld: 124 mg/dL — ABNORMAL HIGH (ref 70–99)
Potassium: 3.5 mmol/L (ref 3.5–5.1)
Sodium: 134 mmol/L — ABNORMAL LOW (ref 135–145)
Total Bilirubin: 0.9 mg/dL (ref 0.0–1.2)
Total Protein: 6.7 g/dL (ref 6.5–8.1)

## 2024-01-29 LAB — CBC
HCT: 40.2 % (ref 36.0–46.0)
Hemoglobin: 13 g/dL (ref 12.0–15.0)
MCH: 31.4 pg (ref 26.0–34.0)
MCHC: 32.3 g/dL (ref 30.0–36.0)
MCV: 97.1 fL (ref 80.0–100.0)
Platelets: 249 K/uL (ref 150–400)
RBC: 4.14 MIL/uL (ref 3.87–5.11)
RDW: 14.3 % (ref 11.5–15.5)
WBC: 8.9 K/uL (ref 4.0–10.5)
nRBC: 0 % (ref 0.0–0.2)

## 2024-01-29 NOTE — Progress Notes (Signed)
 Mobility Specialist Progress Note:   01/29/24 1530  Mobility  Activity Ambulated with assistance  Level of Assistance Contact guard assist, steadying assist  Assistive Device Four wheel walker  Distance Ambulated (ft) 70 ft  Activity Response Tolerated fair  Mobility Referral Yes  Mobility visit 1 Mobility  Mobility Specialist Start Time (ACUTE ONLY) 1520  Mobility Specialist Stop Time (ACUTE ONLY) 1542  Mobility Specialist Time Calculation (min) (ACUTE ONLY) 22 min   Pt with high anxiety throughout session. Required x2 seated rest break with short hallway distance. No unsteadiness noted. Attempted ambulation on RA, desat to low 80s. VSS on 2LO2. Pt back in bed with all needs met, daughter present.   Therisa Rana Mobility Specialist Please contact via SecureChat or  Rehab office at [phone]

## 2024-01-29 NOTE — Plan of Care (Signed)
°  Problem: Education: Goal: Knowledge of General Education information will improve Description: Including pain rating scale, medication(s)/side effects and non-pharmacologic comfort measures Outcome: Progressing   Problem: Clinical Measurements: Goal: Respiratory complications will improve Outcome: Progressing   Problem: Activity: Goal: Risk for activity intolerance will decrease Outcome: Progressing   Problem: Nutrition: Goal: Adequate nutrition will be maintained Outcome: Progressing   Problem: Coping: Goal: Level of anxiety will decrease Outcome: Progressing   Problem: Elimination: Goal: Will not experience complications related to bowel motility Outcome: Progressing

## 2024-01-29 NOTE — Plan of Care (Signed)
  Problem: Clinical Measurements: Goal: Will remain free from infection Outcome: Progressing   Problem: Coping: Goal: Level of anxiety will decrease Outcome: Progressing   Problem: Elimination: Goal: Will not experience complications related to bowel motility Outcome: Progressing Goal: Will not experience complications related to urinary retention Outcome: Progressing   Problem: Cardiac: Goal: Ability to achieve and maintain adequate cardiopulmonary perfusion will improve Outcome: Progressing

## 2024-01-29 NOTE — Progress Notes (Signed)
 Nurse requested Mobility Specialist to perform oxygen saturation test with pt which includes removing pt from oxygen both at rest and while ambulating.  Below are the results from that testing.     Patient Saturations on Room Air at Rest = spO2 89%  Patient Saturations on Room Air while Ambulating = sp02 82% .  Patient Saturations on 2 Liters of oxygen while Ambulating = sp02 91%  At end of testing pt left in room on 2  Liters of oxygen.  Reported results to nurse.   Therisa Rana Mobility Specialist Please contact via SecureChat or  Rehab office at [phone]

## 2024-01-29 NOTE — Progress Notes (Signed)
 PROGRESS NOTE    Alexandria [name]  FMW:[id number] DOB: 1945-11-07 DOA: 01/24/2024 PCP: Antonio Cyndee [name], DO   78/F w IPF, chronic HFpEF, hypothyroidism, IBS, GERD, and history of TIA who presents with shortness of breath and cough over the past 3d, + chest discomfort. In ED, hypoxic, 85% on room air with tachypnea, Chest x-ray shows chronic ILD changes without acute findings.  Labs are notable for normal CMP, normal CBC, negative COVID, influenza, and RSV PCR, troponin 58, and proBNP 5530. - Admitted, started on diuretics  Subjective: - Feels fair overall, anxious, does not feel ready to go home today, not thrilled to go home with oxygen  Assessment and Plan:  Acute on chronic HFpEF; acute hypoxic respiratory failure  - EF was 60-65% with question of severe AI on echo from September 2025  - Pro-BNP much higher than prior - Diuresed X 3 days, volume status has improved however still with some dyspnea and cough at rest and significant with any activity  -Switch to oral Lasix  today, continue Aldactone  suspect - Underlying pulmonary fibrosis contributing to her symptoms, HRCT done, concerning for progressive disease, appreciate pulmonary consult, no indication for steroids at this time, recommended follow-up with pulmonary in [facility] or [facility] and consider change to [place] - Will check ambulatory O2 sats, discharge planning today  Moderate to severe aortic regurgitation -Noted on echo from 9/25, followed by cardiology recommended continued surveillance for now   IPF (idiopathic pulmonary fibrosis) (HCC) - HRCT suggestive of progressive disease, respiratory virus panel is negative, procalcitonin <0.1 -Pulmonary consult requested, may need steroids -Continue Bronchodilator therapy and inhaled steroids.  -wean O2   Hypertension  - Stable, hold losartan       Hypothyroidism  - Synthroid     DVT prophylaxis: lovenox  Code Status: full Family Communication: None  present Disposition Plan: Home tomorrow    Objective: Vitals:   01/29/24 0743 01/29/24 0744 01/29/24 0751 01/29/24 1140  BP:  (!) 154/72  135/74  Pulse:  71  70  Resp:  20    Temp:  97.7 F (36.5 C)  97.6 F (36.4 C)  TempSrc:  Oral  Oral  SpO2: 98% 97% 100% 100%  Weight:      Height:        Intake/Output Summary (Last 24 hours) at 01/29/2024 1325 Last data filed at 01/29/2024 1300 Gross per 24 hour  Intake 480 ml  Output 1000 ml  Net -520 ml   Filed Weights   01/27/24 0336 01/28/24 0408 01/29/24 0500  Weight: 56.3 kg 55.3 kg 55.9 kg    Examination:  Gen: Awake, Alert, Oriented X 3,  HEENT: no JVD Lungs: Diffuse bilateral rales CVS: S1S2/RRR Abd: soft, Non tender, non distended, BS present Extremities: No edema Skin: no new rashes on exposed skin     Data Reviewed:   CBC: Recent Labs  Lab 01/24/24 1529 01/25/24 0218 01/29/24 0247  WBC 7.1 7.0 8.9  NEUTROABS 4.2  --   --   HGB 14.3 12.8 13.0  HCT 43.6 39.3 40.2  MCV 96.9 98.5 97.1  PLT 269 215 249   Basic Metabolic Panel: Recent Labs  Lab 01/25/24 0218 01/26/24 0250 01/27/24 0225 01/28/24 0231 01/29/24 0247  NA 136 136 133* 135 134*  K 3.8 3.8 3.7 3.5 3.5  CL 98 98 93* 93* 91*  CO2 29 28 30  32 32  GLUCOSE 97 160* 115* 112* 124*  BUN 11 15 16 15 12   CREATININE 0.89 1.00 1.04* 1.04*  1.02*  CALCIUM  8.4* 8.7* 8.6* 8.7* 8.9  MG 2.1 2.0  --   --   --    GFR: Estimated Creatinine Clearance: 38.6 mL/min (A) (by C-G formula based on SCr of 1.02 mg/dL (H)). Liver Function Tests: Recent Labs  Lab 01/24/24 1529 01/29/24 0247  AST 22 20  ALT 6 9  ALKPHOS 96 59  BILITOT 1.0 0.9  PROT 7.8 6.7  ALBUMIN  4.1 3.0*   No results for input(s): LIPASE, AMYLASE in the last 168 hours. No results for input(s): AMMONIA in the last 168 hours. Coagulation Profile: No results for input(s): INR, PROTIME in the last 168 hours. Cardiac Enzymes: No results for input(s): CKTOTAL, CKMB,  CKMBINDEX, TROPONINI in the last 168 hours. BNP (last 3 results) Recent Labs    08/25/23 0644 01/24/24 1529  PROBNP 713.0* 5,530.0*   HbA1C: No results for input(s): HGBA1C in the last 72 hours. CBG: No results for input(s): GLUCAP in the last 168 hours. Lipid Profile: No results for input(s): CHOL, HDL, LDLCALC, TRIG, CHOLHDL, LDLDIRECT in the last 72 hours. Thyroid  Function Tests: No results for input(s): TSH, T4TOTAL, FREET4, T3FREE, THYROIDAB in the last 72 hours. Anemia Panel: No results for input(s): VITAMINB12, FOLATE, FERRITIN, TIBC, IRON , RETICCTPCT in the last 72 hours. Urine analysis:    Component Value Date/Time   COLORURINE YELLOW 08/28/2022 1651   APPEARANCEUR CLEAR 08/28/2022 1651   LABSPEC 1.015 08/28/2022 1651   PHURINE 5.5 08/28/2022 1651   GLUCOSEU NEGATIVE 08/28/2022 1651   HGBUR NEGATIVE 08/28/2022 1651   BILIRUBINUR small 04/23/2023 1437   KETONESUR NEGATIVE 08/28/2022 1651   PROTEINUR Negative 04/23/2023 1437   PROTEINUR NEGATIVE 08/28/2022 1651   UROBILINOGEN 0.2 04/23/2023 1437   NITRITE Negative 04/23/2023 1437   NITRITE NEGATIVE 08/28/2022 1651   LEUKOCYTESUR Trace (A) 04/23/2023 1437   LEUKOCYTESUR NEGATIVE 08/28/2022 1651   Sepsis Labs: @LABRCNTIP (procalcitonin:4,lacticidven:4)  ) Recent Results (from the past 240 hours)  Resp panel by RT-PCR (RSV, Flu A&B, Covid) Anterior Nasal Swab     Status: None   Collection Time: 01/24/24  3:29 PM   Specimen: Anterior Nasal Swab  Result Value Ref Range Status   SARS Coronavirus 2 by RT PCR NEGATIVE NEGATIVE Final    Comment: (NOTE) SARS-CoV-2 target nucleic acids are NOT DETECTED.  The SARS-CoV-2 RNA is generally detectable in upper respiratory specimens during the acute phase of infection. The lowest concentration of SARS-CoV-2 viral copies this assay can detect is 138 copies/mL. A negative result does not preclude SARS-Cov-2 infection and should not be  used as the sole basis for treatment or other patient management decisions. A negative result may occur with  improper specimen collection/handling, submission of specimen other than nasopharyngeal swab, presence of viral mutation(s) within the areas targeted by this assay, and inadequate number of viral copies(<138 copies/mL). A negative result must be combined with clinical observations, patient history, and epidemiological information. The expected result is Negative.  Fact Sheet for Patients:  bloggercourse.com  Fact Sheet for Healthcare Providers:  seriousbroker.it  This test is no t yet approved or cleared by the [place]  FDA and  has been authorized for detection and/or diagnosis of SARS-CoV-2 by FDA under an Emergency Use Authorization (EUA). This EUA will remain  in effect (meaning this test can be used) for the duration of the COVID-19 declaration under Section 564(b)(1) of the Act, 21 U.S.C.section 360bbb-3(b)(1), unless the authorization is terminated  or revoked sooner.       Influenza A by PCR  NEGATIVE NEGATIVE Final   Influenza B by PCR NEGATIVE NEGATIVE Final    Comment: (NOTE) The Xpert Xpress SARS-CoV-2/FLU/RSV plus assay is intended as an aid in the diagnosis of influenza from Nasopharyngeal swab specimens and should not be used as a sole basis for treatment. Nasal washings and aspirates are unacceptable for Xpert Xpress SARS-CoV-2/FLU/RSV testing.  Fact Sheet for Patients: bloggercourse.com  Fact Sheet for Healthcare Providers: seriousbroker.it  This test is not yet approved or cleared by the [place]  FDA and has been authorized for detection and/or diagnosis of SARS-CoV-2 by FDA under an Emergency Use Authorization (EUA). This EUA will remain in effect (meaning this test can be used) for the duration of the COVID-19 declaration under Section  564(b)(1) of the Act, 21 U.S.C. section 360bbb-3(b)(1), unless the authorization is terminated or revoked.     Resp Syncytial Virus by PCR NEGATIVE NEGATIVE Final    Comment: (NOTE) Fact Sheet for Patients: bloggercourse.com  Fact Sheet for Healthcare Providers: seriousbroker.it  This test is not yet approved or cleared by the [place]  FDA and has been authorized for detection and/or diagnosis of SARS-CoV-2 by FDA under an Emergency Use Authorization (EUA). This EUA will remain in effect (meaning this test can be used) for the duration of the COVID-19 declaration under Section 564(b)(1) of the Act, 21 U.S.C. section 360bbb-3(b)(1), unless the authorization is terminated or revoked.  Performed at [facility], [address] [place], [place] [address]   Respiratory (~20 pathogens) panel by PCR     Status: None   Collection Time: 01/27/24  8:46 AM   Specimen: Nasopharyngeal Swab; Respiratory  Result Value Ref Range Status   Adenovirus NOT DETECTED NOT DETECTED Final   Coronavirus 229E NOT DETECTED NOT DETECTED Final    Comment: (NOTE) The Coronavirus on the Respiratory Panel, DOES NOT test for the novel  Coronavirus (2019 nCoV)    Coronavirus HKU1 NOT DETECTED NOT DETECTED Final   Coronavirus NL63 NOT DETECTED NOT DETECTED Final   Coronavirus OC43 NOT DETECTED NOT DETECTED Final   Metapneumovirus NOT DETECTED NOT DETECTED Final   Rhinovirus / Enterovirus NOT DETECTED NOT DETECTED Final   Influenza A NOT DETECTED NOT DETECTED Final   Influenza B NOT DETECTED NOT DETECTED Final   Parainfluenza Virus 1 NOT DETECTED NOT DETECTED Final   Parainfluenza Virus 2 NOT DETECTED NOT DETECTED Final   Parainfluenza Virus 3 NOT DETECTED NOT DETECTED Final   Parainfluenza Virus 4 NOT DETECTED NOT DETECTED Final   Respiratory Syncytial Virus NOT DETECTED NOT DETECTED Final   Bordetella pertussis NOT DETECTED NOT DETECTED  Final   Bordetella Parapertussis NOT DETECTED NOT DETECTED Final   Chlamydophila pneumoniae NOT DETECTED NOT DETECTED Final   Mycoplasma pneumoniae NOT DETECTED NOT DETECTED Final    Comment: Performed at [facility], [address] [place], [place] [address]     Radiology Studies: No results found.     Scheduled Meds:  budesonide  (PULMICORT ) nebulizer solution  0.25 mg Nebulization BID   colestipol   2 g Oral BID   enoxaparin  (LOVENOX ) injection  40 mg Subcutaneous Daily   furosemide   40 mg Oral Daily   Gerhardt's butt cream   Topical BID   levothyroxine   112 mcg Oral QAC breakfast   pantoprazole   40 mg Oral Daily   rosuvastatin  20 mg Oral Daily   sodium chloride  flush  3 mL Intravenous Q12H   spironolactone   25 mg Oral Daily   Continuous Infusions:  LOS: 5 days    Time spent:    Sigurd Pac, MD [facility]   01/29/2024, 1:25 PM

## 2024-01-30 ENCOUNTER — Other Ambulatory Visit (HOSPITAL_COMMUNITY): Payer: Self-pay

## 2024-01-30 DIAGNOSIS — I5033 Acute on chronic diastolic (congestive) heart failure: Secondary | ICD-10-CM | POA: Diagnosis not present

## 2024-01-30 MED ORDER — ESOMEPRAZOLE MAGNESIUM 40 MG PO CPDR
40.0000 mg | DELAYED_RELEASE_CAPSULE | Freq: Every day | ORAL | 0 refills | Status: DC
  Filled 2024-01-30: qty 30, 30d supply, fill #0

## 2024-01-30 MED ORDER — SPIRONOLACTONE 25 MG PO TABS: 25.0000 mg | ORAL_TABLET | Freq: Every day | ORAL | 1 refills | Status: AC

## 2024-01-30 MED ORDER — ROSUVASTATIN CALCIUM 20 MG PO TABS
20.0000 mg | ORAL_TABLET | Freq: Every day | ORAL | 0 refills | Status: AC
  Filled 2024-01-30: qty 30, 30d supply, fill #0

## 2024-01-30 MED ORDER — POTASSIUM CHLORIDE CRYS ER 20 MEQ PO TBCR: 20.0000 meq | EXTENDED_RELEASE_TABLET | Freq: Every day | ORAL | 0 refills | Status: DC

## 2024-01-30 MED ORDER — FUROSEMIDE 40 MG PO TABS: 40.0000 mg | ORAL_TABLET | Freq: Every day | ORAL | 0 refills | Status: AC

## 2024-01-30 NOTE — Progress Notes (Addendum)
 AVS and discharge education,   PIV removed gauze dressing intact.  Patent given time to ask questions.  Patient is very anxious about wearing the oxygen.  She states, it's all new to her. Pt had questions regarding home meds that she was given and she does not have them at home.   Verbal explanation and redirection regarding oxygen tank given.  Home Oxygen tank at bedside. Maintained on 2 L [place].   No distress noted.  Reassurance given by staff.  Secure chat sent to Garrel armin Bouchard, primary RN.s and Dr. Sigurd.  Daughter Ronal called at 1235pm. AVS completed with daughter as well.  Daughter expressed verbal understanding and given time to ask questions.  Currently patient in room with spouse.

## 2024-01-30 NOTE — Discharge Summary (Signed)
 Physician Discharge Summary  Alexandria [name] FMW:[id number] DOB: 21-Apr-1945 DOA: 01/24/2024  PCP: Alexandria Cyndee [name], DO  Admit date: 01/24/2024 Discharge date: 01/30/2024  Time spent:  45 minutes  Recommendations for Outpatient Follow-up:  [facility] Dr. Eva Gum in 2 weeks PCP in 1 week Home health RN, home O2 set up   Discharge Diagnoses:  Acute hypoxic respiratory failure Progressive idiopathic pulmonary fibrosis Acute on chronic diastolic CHF (congestive heart failure) (HCC) Essential hypertension   Hypothyroidism   Dyslipidemia   Discharge Condition: Improved  Diet recommendation: Low-sodium, heart healthy  Filed Weights   01/28/24 0408 01/29/24 0500 01/30/24 0450  Weight: 55.3 kg 55.9 kg 56 kg    History of present illness:  78/F w IPF, chronic HFpEF, hypothyroidism, IBS, GERD, and history of TIA who presents with shortness of breath and cough over the past 3d, + chest discomfort. In ED, hypoxic, 85% on room air with tachypnea, Chest x-ray shows chronic ILD changes without acute findings.  Labs are notable for normal CMP, normal CBC, negative COVID, influenza, and RSV PCR, troponin 58, and proBNP 5530. - Admitted, started on diuretics  Hospital Course:   Acute hypoxic respiratory failure  Idiopathic pulmonary fibrosis -Had only minimal improvement with diuresis -Subsequently HRCT obtained which noted progressive pulmonary fibrosis, required 2 to 3 L oxygen at rest this admission -Respiratory virus panel negative, procalcitonin less than 0.1, no acute infectious trigger noted -Seen by pulmonary in consultation, she did not feel that patient needed steroids at this time, recommended follow-up with pulmonary in [facility] Dr.Blaylock  and consider change to Esbriet - Upon checking ambulatory sats, she desaturated with activity and required 2 L O2 - Charged home with home O2, recommended close follow-up with pulmonary  Acute on chronic diastolic CHF   Moderate to severe aortic regurgitation -Noted on echo from 9/25, followed by cardiology recommended continued surveillance for now -Otherwise echo showed preserved EF, normal RV, proBNP was higher than prior, was mildly volume overloaded on admission, diuresed with IV Lasix , started on Aldactone  transition to oral diuretics now -Follow-up with [facility] for aortic insufficiency    Hypertension  - Stable, hold losartan       Hypothyroidism  - Synthroid     Discharge Exam: Vitals:   01/30/24 0718 01/30/24 0742  BP: (!) 162/91   Pulse: 74 75  Resp: 18 17  Temp: 97.6 F (36.4 C)   SpO2: 98%    Gen: Awake, Alert, Oriented X 3,  HEENT: no JVD Lungs: Fine bilateral rales CVS: S1S2/RRR Abd: soft, Non tender, non distended, BS present Extremities: No edema Skin: no new rashes on exposed skin   Discharge Instructions   Discharge Instructions     Diet - low sodium heart healthy   Complete by: As directed    Increase activity slowly   Complete by: As directed       Allergies as of 01/30/2024       Reactions   Influenza Vaccines Other (See Comments)   Pt reports heart attack after flu shot   Prednisone  Other (See Comments)   High doses caused mental status changes.   Dilaudid  [hydromorphone ] Itching, Other (See Comments)   Dilaudid  caused marked confusion   Ipratropium Bromide  Itching, Other (See Comments)   Mental Status Changes, Insomnia   Cheratussin Ac [guaifenesin -codeine ] Other (See Comments)   Headaches - patient denies   Codeine  Itching, Nausea Only, Other (See Comments)   Pt takes promethazine  with codeine  at home   Diltiazem  Nausea  And Vomiting   Hydrocodone Itching   Ibuprofen  Itching, Other (See Comments)   Gives false reading in blood   Morphine  Itching   Oxycodone  Itching        Medication List     STOP taking these medications    amoxicillin  875 MG tablet Commonly known as: AMOXIL    losartan  25 MG tablet Commonly known as:  COZAAR        TAKE these medications    ascorbic acid  500 MG tablet Commonly known as: VITAMIN C  Take 500 mg by mouth every other day.   azelastine  0.1 % nasal spray Commonly known as: ASTELIN  Place 1 spray into both nostrils daily as needed for rhinitis. Use in each nostril as directed   benzonatate  200 MG capsule Commonly known as: TESSALON  Take 1 capsule (200 mg total) by mouth 3 (three) times daily as needed for cough.   colestipol  1 g tablet Commonly known as: COLESTID  Take 2 tablets (2 g total) by mouth 2 (two) times daily. What changed:  how much to take when to take this reasons to take this   diphenoxylate -atropine  2.5-0.025 MG tablet Commonly known as: LOMOTIL  Take 2 tablets by mouth 3 (three) times daily as needed for diarrhea or loose stools.   esomeprazole  40 MG capsule Commonly known as: NexIUM  Take 1 capsule (40 mg total) by mouth 2 (two) times daily before a meal. What changed:  when to take this reasons to take this   Full Kit Nebulizer Set Misc Use with DUONEB for COPD treatment   furosemide  40 MG tablet Commonly known as: LASIX  Take 1 tablet (40 mg total) by mouth daily.   levalbuterol  1.25 MG/3ML nebulizer solution Commonly known as: XOPENEX  Take 1.25 mg by nebulization every 6 (six) hours as needed for wheezing or shortness of breath.   multivitamin with minerals Tabs tablet Take 1 tablet by mouth every Monday, Wednesday, and Friday.   ondansetron  4 MG tablet Commonly known as: ZOFRAN  Take 1 tablet (4 mg total) by mouth every 8 (eight) hours as needed.   potassium chloride  SA 20 MEQ tablet Commonly known as: KLOR-CON  M Take 1 tablet (20 mEq total) by mouth daily.   promethazine -dextromethorphan 6.25-15 MG/5ML syrup Commonly known as: PROMETHAZINE -DM TAKE 5MLS BY MOUTH 4 TIMES A DAY AS NEEDED   rosuvastatin 20 MG tablet Commonly known as: CRESTOR Take 1 tablet (20 mg total) by mouth daily.   spironolactone  25 MG tablet Commonly  known as: ALDACTONE  Take 1 tablet (25 mg total) by mouth daily.   Synthroid  112 MCG tablet Generic drug: levothyroxine  Take 1 tablet (112 mcg total) by mouth daily before breakfast.               Durable Medical Equipment  (From admission, onward)           Start     Ordered   01/30/24 0858  For home use only DME oxygen  Once       Question Answer Comment  Length of Need Lifetime   Mode or (Route) Nasal cannula   Liters per Minute 2   Oxygen conserving device Yes   Oxygen delivery system: Gas      01/30/24 0858           Allergies  Allergen Reactions   Influenza Vaccines Other (See Comments)    Pt reports heart attack after flu shot   Prednisone  Other (See Comments)    High doses caused mental status changes.   Dilaudid  [Hydromorphone ] Itching and  Other (See Comments)    Dilaudid  caused marked confusion   Ipratropium Bromide  Itching and Other (See Comments)    Mental Status Changes, Insomnia   Cheratussin Ac [Guaifenesin -Codeine ] Other (See Comments)    Headaches - patient denies   Codeine  Itching, Nausea Only and Other (See Comments)    Pt takes promethazine  with codeine  at home   Diltiazem  Nausea And Vomiting   Hydrocodone Itching   Ibuprofen  Itching and Other (See Comments)    Gives false reading in blood   Morphine  Itching   Oxycodone  Itching    Follow-up Information     [facility], [facility] Follow up.   Why: Call [phone] when you get home to set up your home concentrator Contact information: [address] [place] [address] [phone]         Care, [facility] Follow up.   Specialty: Home Health Services Why: for home health services they will call you in 1-2 days to set up your first appointment Contact information: [address] [place] [address] [phone]                  The results of significant diagnostics from this hospitalization (including imaging, microbiology, ancillary  and laboratory) are listed below for reference.    Significant Diagnostic Studies: CT Chest High Resolution Result Date: 01/28/2024 CLINICAL DATA:  Hypoxemia. EXAM: CT CHEST WITHOUT CONTRAST TECHNIQUE: Multidetector CT imaging of the chest was performed following the standard protocol without intravenous contrast. High resolution imaging of the lungs, as well as inspiratory and expiratory imaging, was performed. RADIATION DOSE REDUCTION: This exam was performed according to the departmental dose-optimization program which includes automated exposure control, adjustment of the mA and/or kV according to patient size and/or use of iterative reconstruction technique. COMPARISON:  08/25/2023, 05/18/2023, 12/10/2021. FINDINGS: Cardiovascular: Atherosclerotic calcification of the aorta, aortic valve and coronary arteries. Ascending aorta measures 4.3 cm (coronal image 31). Enlarged pulmonic trunk and heart. No pericardial effusion. Mediastinum/Nodes: Mediastinal lymph nodes measure up to 11 mm in the low right paratracheal station and are considered reactive in etiology given interstitial lung disease. Hilar regions are difficult to definitively evaluate without IV contrast. No axillary adenopathy. Air and fluid are seen in the esophagus, suggesting dysmotility. Prominent cisterna chyli. Lungs/Pleura: Peripheral and basilar predominant subpleural reticulation, consolidation, ground-glass and traction bronchiectasis/bronchiolectasis, markedly progressive from 12/11/2023. There is somewhat of a micro nodular upper and midlung zone predominant pattern with nodularity along the fissures. No pleural fluid. Debris in the airway. No air trapping. Upper Abdomen: Visualized portions of the liver, gallbladder, adrenal glands, kidneys, spleen, pancreas, stomach and bowel are grossly unremarkable. No upper abdominal adenopathy. Musculoskeletal: Degenerative changes in the spine. IMPRESSION: 1. Pulmonary parenchymal pattern of  fibrotic interstitial lung disease, as detailed above, rather progressive from 12/10/2021 and in keeping with a known diagnosis of idiopathic pulmonary fibrosis. 2. Upper and midlung zone predominant perilymphatic micronodularity. Difficult to exclude superimposed sarcoid. 3. 4.3 cm ascending aortic aneurysm, stable. Recommend annual imaging followup by CTA or MRA. This recommendation follows 2010 ACCF/AHA/AATS/ACR/ASA/SCA/SCAI/SIR/STS/SVM Guidelines for the Diagnosis and Management of Patients with Thoracic Aortic Disease. Circulation. 2010; 121: Z733-z630. Aortic aneurysm NOS (ICD10-I71.9). 4. Aortic atherosclerosis (ICD10-I70.0). Coronary artery calcification. 5. Enlarged pulmonic trunk, indicative of pulmonary arterial hypertension. Electronically Signed   By: Newell Eke M.D.   On: 01/28/2024 09:47   DG Chest 2 View Result Date: 01/24/2024 EXAM: 2 VIEW(S) XRAY OF THE CHEST 01/24/2024 03:46:11 PM COMPARISON: 10/29/2023 CLINICAL HISTORY: shob.  78 y/o female c/o shob x 3 days. Also c/o chest pain x 3 days. FINDINGS: LUNGS AND PLEURA: Diffuse, bilateral reticular interstitial opacities are identified. Thickening along the minor fissure of the right lung is again noted and appears unchanged. Lung volumes are low. No pulmonary edema. No pleural effusion. No pneumothorax. HEART AND MEDIASTINUM: Aortic atherosclerotic calcification. No acute abnormality of the cardiac and mediastinal silhouettes. BONES AND SOFT TISSUES: No acute osseous abnormality. IMPRESSION: 1. No acute findings. 2. Diffuse, bilateral reticular interstitial opacities and right minor fissure thickening, unchanged from prior study. 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 3. Aortic atherosclerotic calcification. Electronically signed by: Waddell Calk MD 01/24/2024 04:19 PM EDT RP Workstation: [id number]    Microbiology: Recent Results (from the past 240 hours)  Resp  panel by RT-PCR (RSV, Flu A&B, Covid) Anterior Nasal Swab     Status: None   Collection Time: 01/24/24  3:29 PM   Specimen: Anterior Nasal Swab  Result Value Ref Range Status   SARS Coronavirus 2 by RT PCR NEGATIVE NEGATIVE Final    Comment: (NOTE) SARS-CoV-2 target nucleic acids are NOT DETECTED.  The SARS-CoV-2 RNA is generally detectable in upper respiratory specimens during the acute phase of infection. The lowest concentration of SARS-CoV-2 viral copies this assay can detect is 138 copies/mL. A negative result does not preclude SARS-Cov-2 infection and should not be used as the sole basis for treatment or other patient management decisions. A negative result may occur with  improper specimen collection/handling, submission of specimen other than nasopharyngeal swab, presence of viral mutation(s) within the areas targeted by this assay, and inadequate number of viral copies(<138 copies/mL). A negative result must be combined with clinical observations, patient history, and epidemiological information. The expected result is Negative.  Fact Sheet for Patients:  bloggercourse.com  Fact Sheet for Healthcare Providers:  seriousbroker.it  This test is no t yet approved or cleared by the [place]  FDA and  has been authorized for detection and/or diagnosis of SARS-CoV-2 by FDA under an Emergency Use Authorization (EUA). This EUA will remain  in effect (meaning this test can be used) for the duration of the COVID-19 declaration under Section 564(b)(1) of the Act, 21 U.S.C.section 360bbb-3(b)(1), unless the authorization is terminated  or revoked sooner.       Influenza A by PCR NEGATIVE NEGATIVE Final   Influenza B by PCR NEGATIVE NEGATIVE Final    Comment: (NOTE) The Xpert Xpress SARS-CoV-2/FLU/RSV plus assay is intended as an aid in the diagnosis of influenza from Nasopharyngeal swab specimens and should not be used as a  sole basis for treatment. Nasal washings and aspirates are unacceptable for Xpert Xpress SARS-CoV-2/FLU/RSV testing.  Fact Sheet for Patients: bloggercourse.com  Fact Sheet for Healthcare Providers: seriousbroker.it  This test is not yet approved or cleared by the [place]  FDA and has been authorized for detection and/or diagnosis of SARS-CoV-2 by FDA under an Emergency Use Authorization (EUA). This EUA will remain in effect (meaning this test can be used) for the duration of the COVID-19 declaration under Section 564(b)(1) of the Act, 21 U.S.C. section 360bbb-3(b)(1), unless the authorization is terminated or revoked.     Resp Syncytial Virus by PCR NEGATIVE NEGATIVE Final    Comment: (NOTE) Fact Sheet for Patients: bloggercourse.com  Fact Sheet for Healthcare Providers: seriousbroker.it  This test is not yet approved or cleared by the [place]  FDA and has been authorized for detection and/or diagnosis of SARS-CoV-2  by FDA under an Emergency Use Authorization (EUA). This EUA will remain in effect (meaning this test can be used) for the duration of the COVID-19 declaration under Section 564(b)(1) of the Act, 21 U.S.C. section 360bbb-3(b)(1), unless the authorization is terminated or revoked.  Performed at [facility], [address] [place], [place] [address]   Respiratory (~20 pathogens) panel by PCR     Status: None   Collection Time: 01/27/24  8:46 AM   Specimen: Nasopharyngeal Swab; Respiratory  Result Value Ref Range Status   Adenovirus NOT DETECTED NOT DETECTED Final   Coronavirus 229E NOT DETECTED NOT DETECTED Final    Comment: (NOTE) The Coronavirus on the Respiratory Panel, DOES NOT test for the novel  Coronavirus (2019 nCoV)    Coronavirus HKU1 NOT DETECTED NOT DETECTED Final   Coronavirus NL63 NOT DETECTED NOT DETECTED Final    Coronavirus OC43 NOT DETECTED NOT DETECTED Final   Metapneumovirus NOT DETECTED NOT DETECTED Final   Rhinovirus / Enterovirus NOT DETECTED NOT DETECTED Final   Influenza A NOT DETECTED NOT DETECTED Final   Influenza B NOT DETECTED NOT DETECTED Final   Parainfluenza Virus 1 NOT DETECTED NOT DETECTED Final   Parainfluenza Virus 2 NOT DETECTED NOT DETECTED Final   Parainfluenza Virus 3 NOT DETECTED NOT DETECTED Final   Parainfluenza Virus 4 NOT DETECTED NOT DETECTED Final   Respiratory Syncytial Virus NOT DETECTED NOT DETECTED Final   Bordetella pertussis NOT DETECTED NOT DETECTED Final   Bordetella Parapertussis NOT DETECTED NOT DETECTED Final   Chlamydophila pneumoniae NOT DETECTED NOT DETECTED Final   Mycoplasma pneumoniae NOT DETECTED NOT DETECTED Final    Comment: Performed at [facility], [address] [place], [place] [address]     Labs: Basic Metabolic Panel: Recent Labs  Lab 01/25/24 0218 01/26/24 0250 01/27/24 0225 01/28/24 0231 01/29/24 0247  NA 136 136 133* 135 134*  K 3.8 3.8 3.7 3.5 3.5  CL 98 98 93* 93* 91*  CO2 29 28 30  32 32  GLUCOSE 97 160* 115* 112* 124*  BUN 11 15 16 15 12   CREATININE 0.89 1.00 1.04* 1.04* 1.02*  CALCIUM  8.4* 8.7* 8.6* 8.7* 8.9  MG 2.1 2.0  --   --   --    Liver Function Tests: Recent Labs  Lab 01/24/24 1529 01/29/24 0247  AST 22 20  ALT 6 9  ALKPHOS 96 59  BILITOT 1.0 0.9  PROT 7.8 6.7  ALBUMIN  4.1 3.0*   No results for input(s): LIPASE, AMYLASE in the last 168 hours. No results for input(s): AMMONIA in the last 168 hours. CBC: Recent Labs  Lab 01/24/24 1529 01/25/24 0218 01/29/24 0247  WBC 7.1 7.0 8.9  NEUTROABS 4.2  --   --   HGB 14.3 12.8 13.0  HCT 43.6 39.3 40.2  MCV 96.9 98.5 97.1  PLT 269 215 249   Cardiac Enzymes: No results for input(s): CKTOTAL, CKMB, CKMBINDEX, TROPONINI in the last 168 hours. BNP: BNP (last 3 results) No results for input(s): BNP in the last 8760 hours.  ProBNP  (last 3 results) Recent Labs    08/25/23 0644 01/24/24 1529  PROBNP 713.0* 5,530.0*    CBG: No results for input(s): GLUCAP in the last 168 hours.     Signed:  Sigurd Pac MD.  [facility] 01/30/2024, 10:38 AM

## 2024-01-30 NOTE — Plan of Care (Signed)
  Problem: Clinical Measurements: Goal: Will remain free from infection Outcome: Progressing Goal: Cardiovascular complication will be avoided Outcome: Progressing   Problem: Activity: Goal: Risk for activity intolerance will decrease Outcome: Progressing   Problem: Coping: Goal: Level of anxiety will decrease Outcome: Progressing   Problem: Elimination: Goal: Will not experience complications related to bowel motility Outcome: Progressing Goal: Will not experience complications related to urinary retention Outcome: Progressing   Problem: Safety: Goal: Ability to remain free from injury will improve Outcome: Progressing

## 2024-01-30 NOTE — TOC Transition Note (Signed)
 Transition of Care [facility]) - Discharge Note   Patient Details  Name: Alexandria [name] MRN: [id number] Date of Birth: 08-31-1945  [facility] CM/SW Contact:  Marval Gell, RN Phone Number: 01/30/2024, 9:21 AM   Clinical Narrative:     Spoke w patient over the phone. She would like HH services. Discussed providers and referral made to [facility], accepted. Home oxygen to be brough to the room before DC. She understands to call when she gets home to set up the home unit   Final next level of care: Home w Home Health Services Barriers to Discharge: No Barriers Identified   Patient Goals and CMS Choice Patient states their goals for this hospitalization and ongoing recovery are:: to go home CMS Medicare.gov Compare Post Acute Care list provided to:: Patient Choice offered to / list presented to : Patient      Discharge Placement                       Discharge Plan and Services Additional resources added to the After Visit Summary for                  DME Arranged: Oxygen DME Agency: [employer] Date DME Agency Contacted: 01/30/24 Time DME Agency Contacted: [phone] Representative spoke with at DME Agency: [place] HH Arranged: RN HH Agency: [facility] Date [facility] Agency Contacted: 01/30/24 Time HH Agency Contacted: [phone] Representative spoke with at [name] Agency: [place]  Social Drivers of Health (SDOH) Interventions SDOH Screenings   Food Insecurity: No Food Insecurity (01/24/2024)  Housing: Low Risk  (01/24/2024)  Transportation Needs: No Transportation Needs (01/24/2024)  Utilities: Not At Risk (01/24/2024)  Depression (PHQ2-9): Low Risk  (06/04/2022)  Social Connections: Unknown (01/24/2024)  Tobacco Use: Low Risk  (01/24/2024)  Recent Concern: Tobacco Use - Medium Risk (01/05/2024)   Received from [facility]     Readmission Risk Interventions    11/20/2022    9:47 AM 04/08/2022    2:29 PM 04/03/2022    8:29 AM  Readmission Risk  Prevention Plan  Post Dischage Appt   Complete  Medication Screening   Complete  Transportation Screening Complete Complete Complete  PCP or Specialist Appt within 5-7 Days  Complete   Home Care Screening  Complete   Medication Review (RN CM)  Complete   Medication Review (RN Care Manager) Complete    HRI or Home Care Consult Complete    SW Recovery Care/Counseling Consult Complete    Palliative Care Screening Not Applicable    Skilled Nursing Facility Not Applicable

## 2024-01-31 ENCOUNTER — Telehealth: Payer: Self-pay

## 2024-01-31 DIAGNOSIS — I700 Atherosclerosis of aorta: Secondary | ICD-10-CM | POA: Diagnosis not present

## 2024-01-31 DIAGNOSIS — J479 Bronchiectasis, uncomplicated: Secondary | ICD-10-CM | POA: Diagnosis not present

## 2024-01-31 DIAGNOSIS — I5033 Acute on chronic diastolic (congestive) heart failure: Secondary | ICD-10-CM | POA: Diagnosis not present

## 2024-01-31 DIAGNOSIS — K589 Irritable bowel syndrome without diarrhea: Secondary | ICD-10-CM | POA: Diagnosis not present

## 2024-01-31 DIAGNOSIS — E039 Hypothyroidism, unspecified: Secondary | ICD-10-CM | POA: Diagnosis not present

## 2024-01-31 DIAGNOSIS — K219 Gastro-esophageal reflux disease without esophagitis: Secondary | ICD-10-CM | POA: Diagnosis not present

## 2024-01-31 DIAGNOSIS — J84112 Idiopathic pulmonary fibrosis: Secondary | ICD-10-CM | POA: Diagnosis not present

## 2024-01-31 DIAGNOSIS — I2510 Atherosclerotic heart disease of native coronary artery without angina pectoris: Secondary | ICD-10-CM | POA: Diagnosis not present

## 2024-01-31 DIAGNOSIS — J9601 Acute respiratory failure with hypoxia: Secondary | ICD-10-CM | POA: Diagnosis not present

## 2024-01-31 DIAGNOSIS — I2721 Secondary pulmonary arterial hypertension: Secondary | ICD-10-CM | POA: Diagnosis not present

## 2024-01-31 DIAGNOSIS — Z9181 History of falling: Secondary | ICD-10-CM | POA: Diagnosis not present

## 2024-01-31 DIAGNOSIS — I110 Hypertensive heart disease with heart failure: Secondary | ICD-10-CM | POA: Diagnosis not present

## 2024-01-31 DIAGNOSIS — I351 Nonrheumatic aortic (valve) insufficiency: Secondary | ICD-10-CM | POA: Diagnosis not present

## 2024-01-31 DIAGNOSIS — Z8673 Personal history of transient ischemic attack (TIA), and cerebral infarction without residual deficits: Secondary | ICD-10-CM | POA: Diagnosis not present

## 2024-01-31 DIAGNOSIS — Z9981 Dependence on supplemental oxygen: Secondary | ICD-10-CM | POA: Diagnosis not present

## 2024-01-31 DIAGNOSIS — I719 Aortic aneurysm of unspecified site, without rupture: Secondary | ICD-10-CM | POA: Diagnosis not present

## 2024-01-31 DIAGNOSIS — E785 Hyperlipidemia, unspecified: Secondary | ICD-10-CM | POA: Diagnosis not present

## 2024-01-31 NOTE — Telephone Encounter (Signed)
 Copied from CRM [id number]. Topic: MyChart - Other >> Jan 31, 2024 11:00 AM Aleatha C wrote: Reason for CRM: Patient came home from the hospital yesterday had a heart attack, got put on oxygen and now patient needs a portable ordered, patient had it down to 2 or 1 and she would take it off so patient wants to know if that is ok, please call patient to confirm and she is using [facility] for oxygen [phone]

## 2024-01-31 NOTE — Transitions of Care (Post Inpatient/ED Visit) (Signed)
   01/31/2024  Name: Alexandria [name] MRN: [id number] DOB: 02-May-1945  Today's TOC FU Call Status: Today's TOC FU Call Status:: Successful TOC FU Call Completed TOC FU Call Complete Date: 01/31/24 Patient's Name and Date of Birth confirmed.  Transition Care Management Follow-up Telephone Call Date of Discharge: 01/30/24 Discharge Facility: [facility]) Type of Discharge: Inpatient Admission Primary Inpatient Discharge Diagnosis:: CHF: Pulmonary Fibrosis How have you been since you were released from the hospital?: Same Any questions or concerns?: Yes Patient Questions/Concerns:: I need new oxygen tubing that stretches longer than 50 feet. She has called [employer] 4 times Patient Questions/Concerns Addressed: Other: (DME company notified. There is a delay in delivery)  Items Reviewed: Did you receive and understand the discharge instructions provided?: Yes Medications obtained,verified, and reconciled?: No Medications Not Reviewed Reasons:: Other: (The patient would not stay on the phone long enough to review) Any new allergies since your discharge?: No Dietary orders reviewed?: Yes Type of Diet Ordered:: Low sodium Heart Healthy Do you have support at home?: Yes People in Home [RPT]: spouse Name of Support/Comfort Primary Source: Ronal Pries, daughter is her support. She states her husband is useless  Medications Reviewed Today: Medications Reviewed Today   Medications were not reviewed in this encounter     Home Care and Equipment/Supplies: Were Home Health Services Ordered?: NA Any new equipment or medical supplies ordered?: Yes Name of Medical supply agency?: [employer] Were you able to get the equipment/medical supplies?: Yes Do you have any questions related to the use of the equipment/supplies?: Yes What questions do you have?: Need more portale tanks and a 100 foot long cord because I cannot get to the bathroom  Functional Questionnaire: Do you need assistance with  bathing/showering or dressing?: No Do you need assistance with meal preparation?: No Do you need assistance with eating?: No Do you have difficulty maintaining continence: No Do you need assistance with getting out of bed/getting out of a chair/moving?: No Do you have difficulty managing or taking your medications?: No  Follow up appointments reviewed: PCP Follow-up appointment confirmed?: Yes Date of PCP follow-up appointment?: 02/11/24 Follow-up Provider: Jamee Schultze Specialist [facility] Follow-up appointment confirmed?: NA Do you need transportation to your follow-up appointment?: No Do you understand care options if your condition(s) worsen?: Yes-patient verbalized understanding  SDOH Interventions Today    Flowsheet Row Most Recent Value  SDOH Interventions   Food Insecurity Interventions Intervention Not Indicated  Housing Interventions Intervention Not Indicated  Transportation Interventions Intervention Not Indicated  Utilities Interventions Intervention Not Indicated    Medford Balboa, BSN, RN [place]  [facility] - [name] RN Care Manager [phone]

## 2024-02-01 ENCOUNTER — Telehealth: Payer: Self-pay

## 2024-02-01 ENCOUNTER — Telehealth: Payer: Self-pay | Admitting: Nurse Practitioner

## 2024-02-01 NOTE — Telephone Encounter (Signed)
 Pt is requesting a callback from nurse regarding her recently being discharged from hospital and now being on oxygen so she stated she'd like to know if her appt for tomorrow can be virtual?. Please advise

## 2024-02-01 NOTE — Telephone Encounter (Signed)
 Attempted to return call to give verbal. Phone continued to ring. Unable to leave VM

## 2024-02-01 NOTE — Telephone Encounter (Signed)
 Copied from CRM [id number]. Topic: Clinical - Home Health Verbal Orders >> Jan 31, 2024  3:10 PM Burnard DEL wrote: Caller/Agency: Tonya[facility] Callback Number: [phone] Service Requested: Skilled Nursing Frequency: 2wk 1 1wk2 1 every 2 wks fixed Any new concerns about the patient? No Patient was delivered oxygen equipment today,and the tubing was too short,and the guy that delivered did not have a longer tube in his truck.Patient has been trying to contact them to get a longer tube delivered.She has not been successful. Patient will be unable to go to the bathroom like she should due to not being able to take oxygen wit  her.

## 2024-02-02 ENCOUNTER — Ambulatory Visit: Admitting: Nurse Practitioner

## 2024-02-03 ENCOUNTER — Telehealth: Payer: Self-pay

## 2024-02-03 DIAGNOSIS — J479 Bronchiectasis, uncomplicated: Secondary | ICD-10-CM | POA: Diagnosis not present

## 2024-02-03 DIAGNOSIS — I5033 Acute on chronic diastolic (congestive) heart failure: Secondary | ICD-10-CM | POA: Diagnosis not present

## 2024-02-03 DIAGNOSIS — I351 Nonrheumatic aortic (valve) insufficiency: Secondary | ICD-10-CM | POA: Diagnosis not present

## 2024-02-03 DIAGNOSIS — J84112 Idiopathic pulmonary fibrosis: Secondary | ICD-10-CM | POA: Diagnosis not present

## 2024-02-03 DIAGNOSIS — I110 Hypertensive heart disease with heart failure: Secondary | ICD-10-CM | POA: Diagnosis not present

## 2024-02-03 DIAGNOSIS — J9601 Acute respiratory failure with hypoxia: Secondary | ICD-10-CM | POA: Diagnosis not present

## 2024-02-03 NOTE — Telephone Encounter (Signed)
 Verbal given

## 2024-02-03 NOTE — Telephone Encounter (Signed)
 Copied from CRM [id number]. Topic: Clinical - Home Health Verbal Orders >> Feb 03, 2024  1:28 PM Dedra B wrote: Caller/Agency: Lele from [facility] Number: [phone] Service Requested: Physical Therapy eval Frequency: TBD after eval Any new concerns about the patient? Yes, anxiety and SOB when walking

## 2024-02-04 ENCOUNTER — Other Ambulatory Visit: Payer: Self-pay | Admitting: Family Medicine

## 2024-02-04 ENCOUNTER — Telehealth: Payer: Self-pay | Admitting: Family Medicine

## 2024-02-04 DIAGNOSIS — I5033 Acute on chronic diastolic (congestive) heart failure: Secondary | ICD-10-CM | POA: Diagnosis not present

## 2024-02-04 DIAGNOSIS — J479 Bronchiectasis, uncomplicated: Secondary | ICD-10-CM | POA: Diagnosis not present

## 2024-02-04 DIAGNOSIS — I110 Hypertensive heart disease with heart failure: Secondary | ICD-10-CM | POA: Diagnosis not present

## 2024-02-04 DIAGNOSIS — R053 Chronic cough: Secondary | ICD-10-CM

## 2024-02-04 DIAGNOSIS — J84112 Idiopathic pulmonary fibrosis: Secondary | ICD-10-CM | POA: Diagnosis not present

## 2024-02-04 DIAGNOSIS — I351 Nonrheumatic aortic (valve) insufficiency: Secondary | ICD-10-CM | POA: Diagnosis not present

## 2024-02-04 DIAGNOSIS — J9601 Acute respiratory failure with hypoxia: Secondary | ICD-10-CM | POA: Diagnosis not present

## 2024-02-04 NOTE — Telephone Encounter (Unsigned)
 Copied from CRM #[id number]. Topic: Clinical - Medication Refill >> Feb 04, 2024  2:33 PM Aisha D wrote: Medication: benzonatate  (TESSALON ) 200 MG capsule, diphenoxylate -atropine  (LOMOTIL ) 2.5-0.025 MG tablet  Has the patient contacted their pharmacy? Yes (Agent: If no, request that the patient contact the pharmacy for the refill. If patient does not wish to contact the pharmacy document the reason why and proceed with request.) (Agent: If yes, when and what did the pharmacy advise?)  This is the patient's preferred pharmacy:  [id number] - [place], [place] - [address] [place]  [address] Phone: [phone] Fax: [phone]  Is this the correct pharmacy for this prescription? Yes If no, delete pharmacy and type the correct one.   Has the prescription been filled recently? No  Is the patient out of the medication? Yes  Has the patient been seen for an appointment in the last year OR does the patient have an upcoming appointment? Yes  Can we respond through MyChart? Yes  Agent: Please be advised that Rx refills may take up to 3 business days. We ask that you follow-up with your pharmacy.

## 2024-02-04 NOTE — Telephone Encounter (Signed)
 Copied from CRM #[id number]. Topic: Clinical - Home Health Verbal Orders >> Feb 04, 2024 10:57 AM Alfonso HERO wrote: Caller/Agency: [facility] Callback Number: [phone] Service Requested: Physical Therapy Frequency: 2 week 3 1 week 3 Any new concerns about the patient? Yes  Patient having a lot of anxiety and panic attacks it contributes to her oxygen to declining

## 2024-02-04 NOTE — Telephone Encounter (Signed)
 Verbal given

## 2024-02-05 MED ORDER — DIPHENOXYLATE-ATROPINE 2.5-0.025 MG PO TABS: 2.0000 | ORAL_TABLET | Freq: Three times a day (TID) | ORAL | 0 refills | Status: DC | PRN

## 2024-02-05 MED ORDER — BENZONATATE 200 MG PO CAPS: 200.0000 mg | ORAL_CAPSULE | Freq: Three times a day (TID) | ORAL | 3 refills | Status: DC | PRN

## 2024-02-08 ENCOUNTER — Telehealth: Payer: Self-pay

## 2024-02-08 DIAGNOSIS — I5033 Acute on chronic diastolic (congestive) heart failure: Secondary | ICD-10-CM | POA: Diagnosis not present

## 2024-02-08 DIAGNOSIS — J479 Bronchiectasis, uncomplicated: Secondary | ICD-10-CM | POA: Diagnosis not present

## 2024-02-08 DIAGNOSIS — J9601 Acute respiratory failure with hypoxia: Secondary | ICD-10-CM | POA: Diagnosis not present

## 2024-02-08 DIAGNOSIS — I110 Hypertensive heart disease with heart failure: Secondary | ICD-10-CM | POA: Diagnosis not present

## 2024-02-08 DIAGNOSIS — J84112 Idiopathic pulmonary fibrosis: Secondary | ICD-10-CM | POA: Diagnosis not present

## 2024-02-08 DIAGNOSIS — I351 Nonrheumatic aortic (valve) insufficiency: Secondary | ICD-10-CM | POA: Diagnosis not present

## 2024-02-08 NOTE — Telephone Encounter (Signed)
 Copied from CRM [id number]. Topic: General - Other >> Feb 08, 2024  1:18 PM Paige D wrote: Reason for CRM: Candyce Gearing home health nurse Hypertension issues. Pt is on furosemide  (LASIX ) 40 MG tablet i once a day and started on spirolactone 25 mg once a day started pt on these meds at the hospital. Nurse is asking if pcp wants to increase or decrease before pt gets to office Friday also wanted to get pt some compression socks to help keep blood pressure up! Call back # for Nurse Lili [phone].

## 2024-02-09 ENCOUNTER — Ambulatory Visit: Payer: Self-pay

## 2024-02-09 NOTE — Telephone Encounter (Signed)
 FYI Only or Action Required?: Action required by provider: clinical question for provider.  Patient was last seen in primary care on 12/13/2023 by Alexandria [name], Alexandria SAUNDERS, DO.  Called Nurse Triage reporting Dizziness.  Symptoms began several days ago.  Interventions attempted: Nothing.  Symptoms are: unchanged. Dizzy when standing. Asking if she can cut back on the Lasix  or Aldactone . Please advise.  Triage Disposition: See Physician Within 24 Hours  Patient/caregiver understands and will follow disposition?: Yes    Copied from CRM #[id number]. Topic: Clinical - Red Word Triage >> Feb 09, 2024  8:59 AM [name] wrote: Red Word that prompted transfer to Nurse Triage: Alexandria [name] home health nurse  called about patient Hypertension issues. Pt is on furosemide  (LASIX ) 40 MG tablet i once a day and started on spirolactone 25 mg once a day started pt on these meds at the hospital. Nurse is asking if pcp wants to increase or decrease before pt gets to office Patient called in now wantig to know if she can get the medication  that home health called in about yesterday and her BP readings were 90 , 100 patient says when she stands up she feels she will pass  out Reason for Disposition  [1] MODERATE dizziness (e.g., interferes with normal activities) AND [2] has NOT been evaluated by doctor (or NP/PA) for this  (Exception: Dizziness caused by heat exposure, sudden standing, or poor fluid intake.)  Answer Assessment - Initial Assessment Questions 1. DESCRIPTION: Describe your dizziness.     dizzy 2. LIGHTHEADED: Do you feel lightheaded? (e.g., somewhat faint, woozy, weak upon standing)     woozy 3. VERTIGO: Do you feel like either you or the room is spinning or tilting? (i.e., vertigo)     no 4. SEVERITY: How bad is it?  Do you feel like you are going to faint? Can you stand and walk?     yes 5. ONSET:  When did the dizziness begin?     3 days 6. AGGRAVATING FACTORS: Does  anything make it worse? (e.g., standing, change in head position)     Standing 7. HEART RATE: Can you tell me your heart rate? How many beats in 15 seconds?  (Note: Not all patients can do this.)       no 8. CAUSE: What do you think is causing the dizziness? (e.g., decreased fluids or food, diarrhea, emotional distress, heat exposure, new medicine, sudden standing, vomiting; unknown)     Medications 9. RECURRENT SYMPTOM: Have you had dizziness before? If Yes, ask: When was the last time? What happened that time?     no 10. OTHER SYMPTOMS: Do you have any other symptoms? (e.g., fever, chest pain, vomiting, diarrhea, bleeding)       no 11. PREGNANCY: Is there any chance you are pregnant? When was your last menstrual period?       no  Protocols used: Dizziness - Lightheadedness-A-AH

## 2024-02-09 NOTE — Telephone Encounter (Unsigned)
 Copied from CRM [id number]. Topic: Clinical - Medical Advice >> Feb 09, 2024  3:06 PM Terri G wrote: Reason for CRM: Patient has an appointment this Friday and stated she doesn't know if she can make it since every time she stands up she feels like she's going to faint.(Already spoke with a nurse about it earlier today) Wondering if Dr.lowne chase can maybe see her virtually . Callback number [phone]

## 2024-02-09 NOTE — Telephone Encounter (Signed)
 Spoke with Rogers the nurse from [facility]. She advices that patient is having orthostatic blood pressures. Pt was at 130/80 sitting and dropped to 100/70. She advised that PT was there pt's blood pressure was dropping between 90-80/60. Pt was feeling dizzy and lightheaded. Pts lasix  was increased in the hospital and was started on Spirolactone. I advised to stop the spirolactone for now so the patient doesn't have fall. Rogers advised she going to let the patient know. Please advise

## 2024-02-09 NOTE — Telephone Encounter (Signed)
 Okay to make virtual?

## 2024-02-09 NOTE — Telephone Encounter (Signed)
 See other telephone note.

## 2024-02-10 DIAGNOSIS — J479 Bronchiectasis, uncomplicated: Secondary | ICD-10-CM | POA: Diagnosis not present

## 2024-02-10 DIAGNOSIS — I5033 Acute on chronic diastolic (congestive) heart failure: Secondary | ICD-10-CM | POA: Diagnosis not present

## 2024-02-10 DIAGNOSIS — I351 Nonrheumatic aortic (valve) insufficiency: Secondary | ICD-10-CM | POA: Diagnosis not present

## 2024-02-10 DIAGNOSIS — J9601 Acute respiratory failure with hypoxia: Secondary | ICD-10-CM | POA: Diagnosis not present

## 2024-02-10 DIAGNOSIS — I110 Hypertensive heart disease with heart failure: Secondary | ICD-10-CM | POA: Diagnosis not present

## 2024-02-10 DIAGNOSIS — J84112 Idiopathic pulmonary fibrosis: Secondary | ICD-10-CM | POA: Diagnosis not present

## 2024-02-10 NOTE — Telephone Encounter (Signed)
 Noted. Pt made aware. Pt has appointment tomorrow

## 2024-02-10 NOTE — Telephone Encounter (Signed)
 Spoke with patient. Pt states she can come in. Her daughter will bring her

## 2024-02-11 ENCOUNTER — Inpatient Hospital Stay: Admitting: Family Medicine

## 2024-02-11 ENCOUNTER — Telehealth: Payer: Self-pay

## 2024-02-11 ENCOUNTER — Telehealth (INDEPENDENT_AMBULATORY_CARE_PROVIDER_SITE_OTHER): Admitting: Family Medicine

## 2024-02-11 DIAGNOSIS — J84112 Idiopathic pulmonary fibrosis: Secondary | ICD-10-CM | POA: Diagnosis not present

## 2024-02-11 DIAGNOSIS — F419 Anxiety disorder, unspecified: Secondary | ICD-10-CM | POA: Diagnosis not present

## 2024-02-11 MED ORDER — NONFORMULARY OR COMPOUNDED ITEM: 1 refills | Status: AC

## 2024-02-11 MED ORDER — DIPHENOXYLATE-ATROPINE 2.5-0.025 MG PO TABS: 2.0000 | ORAL_TABLET | Freq: Three times a day (TID) | ORAL | 0 refills | Status: DC | PRN

## 2024-02-11 MED ORDER — MIRTAZAPINE 15 MG PO TBDP: 15.0000 mg | ORAL_TABLET | Freq: Every day | ORAL | 2 refills | Status: DC

## 2024-02-11 NOTE — Telephone Encounter (Signed)
 Copied from CRM [id number]. Topic: Clinical - Medication Question >> Feb 11, 2024  4:03 PM Alfonso HERO wrote: Reason for CRM: patient say she and the Dr were talking about getting her some anxiety medication during her visit and got off subject and never finished talking about it. Requesting a call from the doctor.

## 2024-02-11 NOTE — Progress Notes (Signed)
 MyChart Video Visit    Virtual Visit via Video Note   This patient is at least at moderate risk for complications without adequate follow up. This format is felt to be most appropriate for this patient at this time. Physical exam was limited by quality of the video and audio technology used for the visit. [name] was able to get the patient set up on a video visit.  Patient location: home Patient and provider in visit Provider location: Office  I discussed the limitations of evaluation and management by telemedicine and the availability of in person appointments. The patient expressed understanding and agreed to proceed.  Visit Date: 02/11/2024  Today's healthcare provider: [name] Antonio Cyndee, DO     Subjective:    Patient ID: Alexandria [name], female    DOB: October 14, 1945, 78 y.o.   MRN: [id number]  No chief complaint on file.   HPI Patient is in today for hosp f/u.  Discussed the use of AI scribe software for clinical note transcription with the patient, who gave verbal consent to proceed.  History of Present Illness Alexandria [name] is a 78 year old female with congestive heart failure who presents with issues related to diuretic management and oxygen use.  She was recently hospitalized and told she had acute diastolic heart failure and fluid around the heart. She was prescribed spironolactone  and torsemide for fluid management but experienced dizziness and near syncope upon standing. She discontinued spironolactone  and reduced torsemide from 40 mg to 20 mg, which alleviated her symptoms. No dizziness since reducing the torsemide dose. She has no swelling in her ankles or body, and her blood pressure dropped from 130 to 90 during episodes of dizziness.  She is currently on oxygen therapy, primarily using it as needed. She wants a portable oxygen unit due to difficulties with her current setup, which includes a 50-foot cord that limits her mobility. She was  unable to access the bathroom due to the cord length, resulting in incontinence. She is dissatisfied with her current oxygen supplier, [employer], due to delayed service. She is on 2 liters of oxygen as needed.  She reports a weight loss down to 121 pounds, which she attributes to fluid loss from diuretic use. Her diet includes cereal, macaroni, and leftover Chinese food, but she feels she should be eating more. Her daughter used to prepare protein shakes, which she no longer does since moving out.  She experiences anxiety, particularly related to her husband's hearing impairment and early Alzheimer's symptoms, which causes her to panic when he is out of sight. She is not currently on any anti-anxiety medication. She also has difficulty sleeping due to anxiety and fear of dying.  She lives with her husband, who is deaf and has early Alzheimer's. Her daughter lives 17 minutes away and assists with meals.    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 the left ventricular wall motion is normal  Hypothyroidism    IPF (idiopathic pulmonary fibrosis) (HCC) 01/2018   Myocardial infarction (HCC)    hx of 30 years ago   Pancreatitis    TIA (transient ischemic attack)    8 years ago    Past Surgical History:  Procedure Laterality Date   BREAST BIOPSY Left    [facility]   CARDIAC CATHETERIZATION     10/2013   CATARACT EXTRACTION Bilateral 03/22/2018   FLEXIBLE SIGMOIDOSCOPY N/A 08/13/2022   Procedure: FLEXIBLE SIGMOIDOSCOPY;  Surgeon: Sheldon Standing, MD;  Location: [facility];  Service: General;  Laterality: N/A;   ILEOSTOMY CLOSURE N/A 11/19/2022   Procedure: OPEN TAKEDOWN OF LOOP ILEOSTOMY;  Surgeon: Sheldon Standing, MD;  Location: [facility];  Service: General;  Laterality: N/A;   LEFT HEART CATHETERIZATION WITH CORONARY ANGIOGRAM N/A 10/25/2013   Procedure: LEFT HEART CATHETERIZATION WITH CORONARY ANGIOGRAM;  Surgeon: Debby DELENA Sor, MD;  Location: [facility] C[facility];  Service: Cardiovascular;  Laterality: N/A;   LYSIS OF ADHESION N/A 08/13/2022   Procedure: LYSIS OF ADHESION, DIVERTING ILEOSTOMY;  Surgeon: Sheldon Standing, MD;  Location: [facility];  Service: General;  Laterality: N/A;   NECK SURGERY     ACDF x 2      done in [place] more than 25 years, doesn't know levels   RHINOPLASTY  08/16/2023   uses [place] in her nostril   TOTAL KNEE ARTHROPLASTY     Bilateral x's 2   VAGINAL HYSTERECTOMY  10/17/1998   [name]   VIDEO BRONCHOSCOPY Bilateral 01/24/2018   Procedure: VIDEO BRONCHOSCOPY WITH FLUORO;  Surgeon: Alaine Vicenta NOVAK, MD;  Location: [facility] ENDOSCOPY;  Service: Cardiopulmonary;  Laterality: Bilateral;   XI ROBOTIC ASSISTED COLOSTOMY TAKEDOWN N/A 08/13/2022   Procedure: ROBOTIC OSTOMY TAKEDOWN, SMALL BOWEL RESECTION X 2, BILATERAL TAP BLOCK, TISSUE PERFUSSION ASSESMENT VIA FIREFLY, MOBILAZATION OF SPLENIC FLEXURE;  Surgeon: Sheldon Standing, MD;  Location: [facility];  Service: General;  Laterality: N/A;   XI ROBOTIC ASSISTED LOWER ANTERIOR RESECTION N/A 03/31/2022   Procedure: XI ROBOTIC ASSISTED LOWER ANTERIOR RESECTION;  Surgeon: Sheldon Standing, MD;  Location: [facility];  Service: General;  Laterality: N/A;    Family History  Problem Relation Age of Onset   Ovarian cancer Mother    Uterine cancer Mother    Lung cancer Father    HIV Brother        60   Kidney cancer Brother    Lung cancer Brother    Other Brother        Mouth Cancer   Colon cancer Maternal Aunt    Heart disease Maternal Grandmother    Stroke Maternal Grandmother     Hypertension Maternal Grandmother    Diabetes Maternal Grandmother    Arthritis Other    Esophageal cancer Neg Hx    Liver disease Neg Hx    Rectal cancer Neg Hx    Stomach cancer Neg Hx     Social History   Socioeconomic History   Marital status: Married    Spouse name: [name]   Number of children: 3   Years of education: Jr college   Highest education level: Not on file  Occupational History   Occupation: Leisure Centre Manager: UNEMPLOYED   Occupation: retired  Tobacco Use   Smoking status: Never   Smokeless tobacco: Never  Vaping Use   Vaping status: Never Used  Substance and Sexual Activity   Alcohol  use: No    Alcohol /week: 0.0 standard drinks of alcohol    Drug use:  No   Sexual activity: Not Currently  Other Topics Concern   Not on file  Social History Narrative   Lives with husband [name]   Caffeine use: coffee (2 cups per day)   Mostly right-handed   Social Drivers of Corporate Investment Banker Strain: Not on file  Food Insecurity: No Food Insecurity (01/31/2024)   Hunger Vital Sign    Worried About Running Out of Food in the Last Year: Never true    Ran Out of Food in the Last Year: Never true  Transportation Needs: No Transportation Needs (01/31/2024)   PRAPARE - Administrator, Civil Service (Medical): No    Lack of Transportation (Non-Medical): No  Physical Activity: Not on file  Stress: Not on file  Social Connections: Unknown (01/24/2024)   Social Connection and Isolation Panel    Frequency of Communication with Friends and Family: Patient declined    Frequency of Social Gatherings with Friends and Family: Patient declined    Attends Religious Services: Patient declined    Database Administrator or Organizations: Patient declined    Attends Banker Meetings: Patient declined    Marital Status: Married  Catering Manager Violence: Not At Risk (01/31/2024)   Humiliation, Afraid, Rape, and Kick questionnaire    Fear of  Current or Ex-Partner: No    Emotionally Abused: No    Physically Abused: No    Sexually Abused: No    Outpatient Medications Prior to Visit  Medication Sig Dispense Refill   azelastine  (ASTELIN ) 0.1 % nasal spray Place 1 spray into both nostrils daily as needed for rhinitis. Use in each nostril as directed     benzonatate  (TESSALON ) 200 MG capsule Take 1 capsule (200 mg total) by mouth 3 (three) times daily as needed for cough. 20 capsule 3   esomeprazole  (NEXIUM ) 40 MG capsule Take 1 capsule (40 mg total) by mouth daily at 12 noon. 30 capsule 0   furosemide  (LASIX ) 40 MG tablet Take 1 tablet (40 mg total) by mouth daily. 30 tablet 0   Multiple Vitamin (MULTIVITAMIN WITH MINERALS) TABS tablet Take 1 tablet by mouth every Monday, Wednesday, and Friday.     OXYGEN Inhale 2 L/min into the lungs continuous.     potassium chloride  SA (KLOR-CON  M) 20 MEQ tablet Take 1 tablet (20 mEq total) by mouth daily. 30 tablet 0   Respiratory Therapy Supplies (FULL KIT NEBULIZER SET) MISC Use with DUONEB for COPD treatment 1 each 0   rosuvastatin (CRESTOR) 20 MG tablet Take 1 tablet (20 mg total) by mouth daily. 30 tablet 0   spironolactone  (ALDACTONE ) 25 MG tablet Take 1 tablet (25 mg total) by mouth daily. 30 tablet 1   SYNTHROID  112 MCG tablet Take 1 tablet (112 mcg total) by mouth daily before breakfast. 90 tablet 0   vitamin C  (ASCORBIC ACID ) 500 MG tablet Take 500 mg by mouth every other day.     diphenoxylate -atropine  (LOMOTIL ) 2.5-0.025 MG tablet Take 2 tablets by mouth 3 (three) times daily as needed for diarrhea or loose stools. 30 tablet 0   levalbuterol  (XOPENEX ) 1.25 MG/3ML nebulizer solution Take 1.25 mg by nebulization every 6 (six) hours as needed for wheezing or shortness of breath. 150 mL 1   ondansetron  (ZOFRAN ) 4 MG tablet Take 1 tablet (4 mg total) by mouth every 8 (eight) hours as needed. 20 tablet 1   promethazine -dextromethorphan (PROMETHAZINE -DM) 6.25-15 MG/5ML syrup TAKE 5MLS BY MOUTH  4 TIMES A DAY  AS NEEDED 118 mL 0   No facility-administered medications prior to visit.    Allergies  Allergen Reactions   Influenza Vaccines Other (See Comments)    Pt reports heart attack after flu shot   Prednisone  Other (See Comments)    High doses caused mental status changes.   Dilaudid  [Hydromorphone ] Itching and Other (See Comments)    Dilaudid  caused marked confusion   Ipratropium Bromide  Itching and Other (See Comments)    Mental Status Changes, Insomnia   Cheratussin Ac [Guaifenesin -Codeine ] Other (See Comments)    Headaches - patient denies   Codeine  Itching, Nausea Only and Other (See Comments)    Pt takes promethazine  with codeine  at home   Diltiazem  Nausea And Vomiting   Hydrocodone Itching   Ibuprofen  Itching and Other (See Comments)    Gives false reading in blood   Morphine  Itching   Oxycodone  Itching    Review of Systems  Constitutional:  Negative for fever and malaise/fatigue.  HENT:  Negative for congestion.   Eyes:  Negative for blurred vision.  Respiratory:  Negative for cough and shortness of breath.   Cardiovascular:  Negative for chest pain, palpitations and leg swelling.  Gastrointestinal:  Negative for vomiting.  Musculoskeletal:  Negative for back pain.  Skin:  Negative for rash.  Neurological:  Negative for loss of consciousness and headaches.       Objective:    Physical Exam Nursing note reviewed.  Constitutional:      Appearance: Normal appearance.  Pulmonary:     Effort: Pulmonary effort is normal.  Neurological:     Mental Status: She is alert.  Psychiatric:        Mood and Affect: Mood normal.        Behavior: Behavior normal.        Thought Content: Thought content normal.        Judgment: Judgment normal.     There were no vitals taken for this visit. Wt Readings from Last 3 Encounters:  01/30/24 123 lb 7.3 oz (56 kg)  11/01/23 128 lb (58.1 kg)  10/12/23 133 lb (60.3 kg)       Assessment & Plan:  IPF  (idiopathic pulmonary fibrosis) (HCC) -     NONFORMULARY OR COMPOUNDED ITEM; Portable oxygen via [place] 2L Dx hypoxia, pulnonary fibrosis  Dispense: 1 each; Refill: 1  Anxiety -     Mirtazapine; Take 1 tablet (15 mg total) by mouth at bedtime.  Dispense: 30 tablet; Refill: 2  Other orders -     Diphenoxylate -Atropine ; Take 2 tablets by mouth 3 (three) times daily as needed for diarrhea or loose stools.  Dispense: 30 tablet; Refill: 0   Assessment and Plan Assessment & Plan Acute diastolic heart failure   She was recently hospitalized for acute diastolic heart failure with fluid overload. Currently on diuretics, she experienced orthostatic hypotension with initial dosing. Adjusting the diuretic regimen to 20 mg of paracetamide improved symptoms. Blood pressure fluctuations persist, with dizziness upon standing. Oxygen therapy is used as needed, with a preference for portable oxygen due to mobility issues. Continue paracetamide 20 mg daily. Prescribed portable oxygen for mobility. Follow up with cardiology next week.  Idiopathic pulmonary fibrosis   Managed with oxygen therapy as needed, requiring 2 liters per minute. Issues with oxygen supply logistics noted, with a preference for a more reliable supplier. Prescribed portable oxygen. Continue oxygen therapy as needed.  Unintentional weight loss   Weight decreased to 121 pounds, likely due to fluid loss  from diastolic heart failure. She reports adequate hunger but limited intake due to fluid restrictions. Current diet includes cereal, macaroni, and leftovers, with plans to incorporate protein shakes for additional calories. Encouraged increased caloric intake with protein shakes.  Anxiety   She experiences anxiety, particularly related to her husband's health and mobility issues. Reports nervousness and panic episodes, especially when her husband is outside. Discussed potential benefit of anti-anxiety medication, but she is hesitant due to side  effects.    I discussed the assessment and treatment plan with the patient. The patient was provided an opportunity to ask questions and all were answered. The patient agreed with the plan and demonstrated an understanding of the instructions.   The patient was advised to call back or seek an in-person evaluation if the symptoms worsen or if the condition fails to improve as anticipated.  [name] Antonio Cyndee, DO  [facility] [phone] (phone) [phone] (fax)  [facility]

## 2024-02-11 NOTE — Telephone Encounter (Signed)
 Rx faxed

## 2024-02-11 NOTE — Telephone Encounter (Signed)
-----   Message from [name] Antonio Cyndee sent at 02/11/2024  3:52 PM EST ----- Pt needs portable o2 --- [employer]  [phone]   fax [phone] Rx printed

## 2024-02-14 ENCOUNTER — Telehealth: Payer: Self-pay | Admitting: Cardiovascular Disease

## 2024-02-14 DIAGNOSIS — I110 Hypertensive heart disease with heart failure: Secondary | ICD-10-CM | POA: Diagnosis not present

## 2024-02-14 DIAGNOSIS — J479 Bronchiectasis, uncomplicated: Secondary | ICD-10-CM | POA: Diagnosis not present

## 2024-02-14 DIAGNOSIS — I5033 Acute on chronic diastolic (congestive) heart failure: Secondary | ICD-10-CM | POA: Diagnosis not present

## 2024-02-14 DIAGNOSIS — I351 Nonrheumatic aortic (valve) insufficiency: Secondary | ICD-10-CM | POA: Diagnosis not present

## 2024-02-14 DIAGNOSIS — J9601 Acute respiratory failure with hypoxia: Secondary | ICD-10-CM | POA: Diagnosis not present

## 2024-02-14 DIAGNOSIS — J84112 Idiopathic pulmonary fibrosis: Secondary | ICD-10-CM | POA: Diagnosis not present

## 2024-02-14 NOTE — Telephone Encounter (Signed)
 Pt calling again to f/u on question of being seen sooner. Please advise.

## 2024-02-14 NOTE — Progress Notes (Deleted)
 02/14/2024 Alexandria [name] [id number] 1945-05-06  Referring provider: Antonio Meth, [name], * Primary GI doctor: Dr. Federico  ASSESSMENT AND PLAN:  Rectal bleeding  with history of robotic drainage pelvic abscess and Hartman rectosigmoid resection/colostomy 03/2022 with Dr. Sheldon subsequent diverting loop ileostomy takedown 11/19/2022 10/2021 colonoscopy chronic colitis sigmoid colon concerning for IBD 03/20/2022 flexible sigmoidoscopy for abnormal CT with rectal thickening congested mucosa sigmoid descending splenic flexure, diverticulosis sigmoid colon, partially obstructing tumor rectosigmoid colon, nonbleeding internal hemorrhoids, anal fissure pathology showed inflammatory polyp with possible ischemia 03/2022 MR pelvis showed 3.6 cm multiloculated fluid collection left perirectal and presacral region favoring diverticulitis and diverticular abscess 03/31/2022 status post robotic lower anterior rectosigmoid resection with colostomy drainage of pelvic abscess with Dr. Sheldon 08/13/2022 colostomy for colon resection and ostomy takedown with Dr. 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 SCAD with previous colon concerning for colitis, consider repeat flex sig/colonoscopy in the furture, nopt needed at this time  COPD with idiopathic pulmonary fibrosis Follows with  [facility] Not on oxygen  CAD  CVA/TIA    Patient Care Team: Antonio Meth, [name], DO as PCP - General (Family Medicine) Croitoru, Jerel, MD as PCP - Cardiology (Cardiology) Tobb, Kardie, DO as Consulting Physician (Cardiology) Federico Rosario BROCKS, MD as Consulting Physician (Gastroenterology) Sheldon Standing, MD as Consulting Physician (General Surgery) Reida Johana Jansky, MD (Otolaryngology) Francyne Jerel, MD as Consulting Physician (Cardiology) Lequita Evalene LABOR, MD as Consulting Physician (Obstetrics and Gynecology)  HISTORY OF PRESENT ILLNESS: 78 y.o. female with a past medical history listed below presents for evaluation of blood in stool.   Last seen 03/2022 by Dr. Federico in our office for abnormal CT scan with thickening in the rectum.  Found to have perforated diverticulitis with partially contained abscess status post takedown of diverting loop ileostomy after urgent Hartmann resection for chronic colonic obstruction with prolonged hospital recovery.  Last seen by Dr. Sheldon 05/31/2023 where patient had unremarkable CT abdomen pelvis, possibility of lower anterior resection syndrome with presentation on fiber.    Discussed the use of AI scribe software for clinical note transcription with the patient, who gave verbal consent to proceed.  History of Present Illness          She  reports that she has never smoked. She has never used smokeless tobacco. She reports that she does not drink alcohol  and does not use drugs.  RELEVANT GI HISTORY, IMAGING AND LABS: Results          CBC    Component Value Date/Time   WBC 8.9 01/29/2024 0247   RBC 4.14 01/29/2024 0247   HGB 13.0 01/29/2024 0247   HGB 13.6 06/23/2023 1653   HCT 40.2 01/29/2024 0247   HCT 40.2 06/23/2023 1653   PLT 249 01/29/2024 0247   PLT 208 06/23/2023 1653   MCV 97.1 01/29/2024 0247   MCV 94 06/23/2023  1653   MCH 31.4 01/29/2024 0247   MCHC 32.3 01/29/2024 0247   RDW 14.3 01/29/2024 0247   RDW  12.5 06/23/2023 1653   LYMPHSABS 1.3 01/24/2024 1529   LYMPHSABS 1.6 03/21/2021 1214   MONOABS 1.0 01/24/2024 1529   EOSABS 0.5 01/24/2024 1529   EOSABS 0.5 (H) 03/21/2021 1214   BASOSABS 0.1 01/24/2024 1529   BASOSABS 0.1 03/21/2021 1214   Recent Labs    08/27/23 0412 09/03/23 1422 09/09/23 1330 09/13/23 1429 09/26/23 2214 09/26/23 2219 09/30/23 1316 01/24/24 1529 01/25/24 0218 01/29/24 0247  HGB 13.9 14.0 12.7 13.4 13.7 13.6 14.3 14.3 12.8 13.0    CMP     Component Value Date/Time   NA 134 (L) 01/29/2024 0247   NA 138 12/07/2023 1205   K 3.5 01/29/2024 0247   CL 91 (L) 01/29/2024 0247   CO2 32 01/29/2024 0247   GLUCOSE 124 (H) 01/29/2024 0247   BUN 12 01/29/2024 0247   BUN 14 12/07/2023 1205   CREATININE 1.02 (H) 01/29/2024 0247   CREATININE 0.80 04/23/2023 1343   CALCIUM  8.9 01/29/2024 0247   PROT 6.7 01/29/2024 0247   ALBUMIN  3.0 (L) 01/29/2024 0247   AST 20 01/29/2024 0247   ALT 9 01/29/2024 0247   ALKPHOS 59 01/29/2024 0247   BILITOT 0.9 01/29/2024 0247   GFRNONAA 56 (L) 01/29/2024 0247   GFRAA 85 05/24/2020 1006      Latest Ref Rng & Units 01/29/2024    2:47 AM 01/24/2024    3:29 PM 09/30/2023    1:16 PM  Hepatic Function  Total Protein 6.5 - 8.1 g/dL 6.7  7.8  7.4   Albumin  3.5 - 5.0 g/dL 3.0  4.1  3.5   AST 15 - 41 U/L 20  22  20    ALT 0 - 44 U/L 9  6  11    Alk Phosphatase 38 - 126 U/L 59  96  64   Total Bilirubin 0.0 - 1.2 mg/dL 0.9  1.0  0.9       Current Medications:   Current Outpatient Medications (Endocrine & Metabolic):    SYNTHROID  112 MCG tablet, Take 1 tablet (112 mcg total) by mouth daily before breakfast.  Current Outpatient Medications (Cardiovascular):    furosemide  (LASIX ) 40 MG tablet, Take 1 tablet (40 mg total) by mouth daily.   rosuvastatin (CRESTOR) 20 MG tablet, Take 1 tablet (20 mg total) by mouth daily.   spironolactone  (ALDACTONE ) 25 MG tablet, Take 1 tablet (25 mg total) by mouth daily.  Current Outpatient  Medications (Respiratory):    azelastine  (ASTELIN ) 0.1 % nasal spray, Place 1 spray into both nostrils daily as needed for rhinitis. Use in each nostril as directed   benzonatate  (TESSALON ) 200 MG capsule, Take 1 capsule (200 mg total) by mouth 3 (three) times daily as needed for cough.   levalbuterol  (XOPENEX ) 1.25 MG/3ML nebulizer solution, Take 1.25 mg by nebulization every 6 (six) hours as needed for wheezing or shortness of breath.   promethazine -dextromethorphan (PROMETHAZINE -DM) 6.25-15 MG/5ML syrup, TAKE BY MOUTH 4 TIMES A DAY AS NEEDED    Current Outpatient Medications (Other):    diphenoxylate -atropine  (LOMOTIL ) 2.5-0.025 MG tablet, Take 2 tablets by mouth 3 (three) times daily as needed for diarrhea or loose stools.   esomeprazole  (NEXIUM ) 40 MG capsule, Take 1 capsule (40 mg total) by mouth daily at 12 noon.   mirtazapine (REMERON SOL-TAB) 15 MG disintegrating tablet, Take 1 tablet (15 mg total) by mouth at bedtime.  Multiple Vitamin (MULTIVITAMIN WITH MINERALS) TABS tablet, Take 1 tablet by mouth every Monday, Wednesday, and Friday.   NONFORMULARY OR COMPOUNDED ITEM, Portable oxygen via [place] 2L Dx hypoxia, pulnonary fibrosis   ondansetron  (ZOFRAN ) 4 MG tablet, Take 1 tablet (4 mg total) by mouth every 8 (eight) hours as needed.   OXYGEN, Inhale 2 L/min into the lungs continuous.   potassium chloride  SA (KLOR-CON  M) 20 MEQ tablet, Take 1 tablet (20 mEq total) by mouth daily.   Respiratory Therapy Supplies (FULL KIT NEBULIZER SET) MISC, Use with DUONEB for COPD treatment   vitamin C  (ASCORBIC ACID ) 500 MG tablet, Take 500 mg by mouth every other day.  Medical History:  Past Medical History:  Diagnosis Date   Allergy     Arthritis    Blood transfusion without reported diagnosis    Bronchial pneumonia    Cataract    Chronic facial pain 08/14/2022   Colon polyp    Diverticulitis    DVT (deep venous thrombosis) (HCC)    lower extremity   H/O cardiac catheterization 2004    Normal coronary arteries   Heart murmur    History of stress test 05/21/2011   Hx of echocardiogram 01/27/2010   Normal Ef 55% the transmitral spectral doppler flow pattern is normal for age. the left ventricular wall motion is normal   Hypothyroidism    IPF (idiopathic pulmonary fibrosis) (HCC) 01/2018   Myocardial infarction (HCC)    hx of 30 years ago   Pancreatitis    TIA (transient ischemic attack)    8 years ago   Allergies:  Allergies  Allergen Reactions   Influenza Vaccines Other (See Comments)    Pt reports heart attack after flu shot   Prednisone  Other (See Comments)    High doses caused mental status changes.   Dilaudid  [Hydromorphone ] Itching and Other (See Comments)    Dilaudid  caused marked confusion   Ipratropium Bromide  Itching and Other (See Comments)    Mental Status Changes, Insomnia   Cheratussin Ac [Guaifenesin -Codeine ] Other (See Comments)    Headaches - patient denies   Codeine  Itching, Nausea Only and Other (See Comments)    Pt takes promethazine  with codeine  at home   Diltiazem  Nausea And Vomiting   Hydrocodone Itching   Ibuprofen  Itching and Other (See Comments)    Gives false reading in blood   Morphine  Itching   Oxycodone  Itching     Surgical History:  She  has a past surgical history that includes Vaginal hysterectomy (10/17/1998); Breast biopsy (Left); Total knee arthroplasty; Neck surgery; Cardiac catheterization; left heart catheterization with coronary angiogram (N/A, 10/25/2013); Video bronchoscopy (Bilateral, 01/24/2018); Cataract extraction (Bilateral, 03/22/2018); XI robotic assisted lower anterior resection (N/A, 03/31/2022); Xi robotic assisted colostomy takedown (N/A, 08/13/2022); Lysis of adhesion (N/A, 08/13/2022); Flexible sigmoidoscopy (N/A, 08/13/2022); Ileostomy closure (N/A, 11/19/2022); and Rhinoplasty (08/16/2023). Family History:  Her family history includes Arthritis in an other family member; Colon cancer in her maternal  aunt; Diabetes in her maternal grandmother; HIV in her brother; Heart disease in her maternal grandmother; Hypertension in her maternal grandmother; Kidney cancer in her brother; Lung cancer in her brother and father; Other in her brother; Ovarian cancer in her mother; Stroke in her maternal grandmother; Uterine cancer in her mother.  REVIEW OF SYSTEMS  : All other systems reviewed and negative except where noted in the History of Present Illness.  PHYSICAL EXAM: There were no vitals taken for this visit. Physical Exam  Alan JONELLE Coombs, PA-C 8:32 AM

## 2024-02-14 NOTE — Telephone Encounter (Signed)
 Pt calling to reschedule 11/11 hospital f/u with [facility]. [facility]. Offered soonest TOC slots, but pt can only come in the evening. Pt agreed to 12/10 appt at 3:35 PM but feels this is too far out. Please advise.

## 2024-02-15 ENCOUNTER — Ambulatory Visit: Admitting: Cardiology

## 2024-02-15 ENCOUNTER — Ambulatory Visit: Payer: Self-pay

## 2024-02-15 ENCOUNTER — Telehealth: Payer: Self-pay

## 2024-02-15 ENCOUNTER — Ambulatory Visit: Admitting: Physician Assistant

## 2024-02-15 NOTE — Telephone Encounter (Signed)
 FYI Only or Action Required?: Action required by provider: clinical question for provider and update on patient condition.  Patient was last seen in primary care on 02/11/2024 by Antonio Meth, [name], DO.  Called Nurse Triage reporting Fever.  Symptoms began today.  Interventions attempted: Rest, hydration, or home remedies.  Symptoms are: unchanged.  Triage Disposition: See HCP Within 4 Hours (Or PCP Triage)  Patient/caregiver understands and will follow disposition?: No, wishes to speak with PCP  Reason for Triage: Patient states she has a fever of 101, coughing and chills. States she was calling to notify provider.   Lonell [phone]  Reason for Disposition  [1] Fever > 101 F (38.3 C) AND [2] age > 60 years  Answer Assessment - Initial Assessment Questions Patient reports fever of 101, moderate chills and nonproductive cough. With being on oxygen, patient is unable to come into the clinic. Patient states PCP will write for antibiotic when she is having similar symptoms. Patient is asking for a call back from the office.   1. TEMPERATURE: What is the most recent temperature?  How was it measured?      101 2. ONSET: When did the fever start?      Started around midnight 3. CHILLS: Do you have chills? If yes: How bad are they?  (e.g., none, mild, moderate, severe)     Yes-moderate 4. OTHER SYMPTOMS: Do you have any other symptoms besides the fever?  (e.g., abdomen pain, cough, diarrhea, earache, headache, sore throat, urination pain)     Cough-nonproductive 5. CAUSE: If there are no symptoms, ask: What do you think is causing the fever?      NA 6. CONTACTS: Does anyone else in the family have an infection?     no 7. TREATMENT: What have you done so far to treat this fever? (e.g., OTC fever medicines)     Hasn't taken any medication 8. IMMUNOCOMPROMISE: Do you have any of the following: diabetes, HIV positive, splenectomy, cancer chemotherapy, chronic  steroid treatment, transplant patient, etc.?     no 9. TRAVEL: Have you traveled out of the country in the last month? (e.g., travel history, exposures)       no  Protocols used: [facility]

## 2024-02-15 NOTE — Telephone Encounter (Signed)
 Copied from CRM [id number]. Topic: General - Other >> Feb 15, 2024  2:57 PM [name] wrote: Reason for CRM: Pt requested a call back from Dr. Jennefer team at [phone] regarding recent Encounters, please refer for more info, pt states that she does not have a fever anymore so is unsure on what to do.

## 2024-02-15 NOTE — Telephone Encounter (Signed)
**Note De-identified  [place] Obfuscation** Please advise 

## 2024-02-15 NOTE — Telephone Encounter (Signed)
 Spoke with patient. Pt states she doesn't have a ride for a in person visit and I did advise she could do a mychart e visit. Pt states she will try

## 2024-02-16 ENCOUNTER — Other Ambulatory Visit: Payer: Self-pay | Admitting: Family Medicine

## 2024-02-16 DIAGNOSIS — J479 Bronchiectasis, uncomplicated: Secondary | ICD-10-CM | POA: Diagnosis not present

## 2024-02-16 DIAGNOSIS — J84112 Idiopathic pulmonary fibrosis: Secondary | ICD-10-CM | POA: Diagnosis not present

## 2024-02-16 DIAGNOSIS — I351 Nonrheumatic aortic (valve) insufficiency: Secondary | ICD-10-CM | POA: Diagnosis not present

## 2024-02-16 DIAGNOSIS — J9601 Acute respiratory failure with hypoxia: Secondary | ICD-10-CM | POA: Diagnosis not present

## 2024-02-16 DIAGNOSIS — I110 Hypertensive heart disease with heart failure: Secondary | ICD-10-CM | POA: Diagnosis not present

## 2024-02-16 DIAGNOSIS — J329 Chronic sinusitis, unspecified: Secondary | ICD-10-CM

## 2024-02-16 DIAGNOSIS — I5033 Acute on chronic diastolic (congestive) heart failure: Secondary | ICD-10-CM | POA: Diagnosis not present

## 2024-02-16 MED ORDER — AMOXICILLIN-POT CLAVULANATE 875-125 MG PO TABS: 1.0000 | ORAL_TABLET | Freq: Two times a day (BID) | ORAL | 0 refills | Status: DC

## 2024-02-16 NOTE — Telephone Encounter (Signed)
 Pt made aware and verbalized understanding.

## 2024-02-16 NOTE — Telephone Encounter (Signed)
 Patient called in stating that she has not had a fever since 2pm yesterday. She stated that she think its just her sinuses. She doesn't have a way with a ride to come in for a visit or go to UC or the ER. She stated that a child psychotherapist is coming to her house today to see her. She stated that she is trying to get rides set up through her insurance.

## 2024-02-18 ENCOUNTER — Telehealth: Payer: Self-pay

## 2024-02-18 ENCOUNTER — Other Ambulatory Visit: Payer: Self-pay | Admitting: Family Medicine

## 2024-02-18 ENCOUNTER — Other Ambulatory Visit: Payer: Self-pay | Admitting: Physician Assistant

## 2024-02-18 DIAGNOSIS — J479 Bronchiectasis, uncomplicated: Secondary | ICD-10-CM | POA: Diagnosis not present

## 2024-02-18 DIAGNOSIS — J9601 Acute respiratory failure with hypoxia: Secondary | ICD-10-CM | POA: Diagnosis not present

## 2024-02-18 DIAGNOSIS — I5033 Acute on chronic diastolic (congestive) heart failure: Secondary | ICD-10-CM | POA: Diagnosis not present

## 2024-02-18 DIAGNOSIS — I351 Nonrheumatic aortic (valve) insufficiency: Secondary | ICD-10-CM | POA: Diagnosis not present

## 2024-02-18 DIAGNOSIS — R051 Acute cough: Secondary | ICD-10-CM

## 2024-02-18 DIAGNOSIS — I110 Hypertensive heart disease with heart failure: Secondary | ICD-10-CM | POA: Diagnosis not present

## 2024-02-18 DIAGNOSIS — J84112 Idiopathic pulmonary fibrosis: Secondary | ICD-10-CM | POA: Diagnosis not present

## 2024-02-18 NOTE — Progress Notes (Unsigned)
 MyChart Video Visit    Virtual Visit via Video Note   This patient is at least at moderate risk for complications without adequate follow up. This format is felt to be most appropriate for this patient at this time. Physical exam was limited by quality of the video and audio technology used for the visit. heather was able to get the patient set up on a video visit.  Patient location: home Patient and provider in visit Provider location: Office  I discussed the limitations of evaluation and management by telemedicine and the availability of in person appointments. The patient expressed understanding and agreed to proceed.  Visit Date: 02/11/2024  Today's healthcare provider: [name] Antonio Cyndee, DO     Subjective:    Patient ID: Alexandria [name], female    DOB: 1945/11/08, 78 y.o.   MRN: [id number]  No chief complaint on file.   HPI Patient is in today for recurrent sinus infections.  Discussed the use of AI scribe software for clinical note transcription with the patient, who gave verbal consent to proceed.  History of Present Illness    Past Medical History:  Diagnosis Date   Allergy     Arthritis    Blood transfusion without reported diagnosis    Bronchial pneumonia    Cataract    Chronic facial pain 08/14/2022   Colon polyp    Diverticulitis    DVT (deep venous thrombosis) (HCC)    lower extremity   H/O cardiac catheterization 2004   Normal coronary arteries   Heart murmur    History of stress test 05/21/2011   Hx of echocardiogram 01/27/2010   Normal Ef 55% the transmitral spectral doppler flow pattern is normal for age. the left ventricular wall motion is normal   Hypothyroidism    IPF (idiopathic pulmonary fibrosis) (HCC) 01/2018   Myocardial infarction (HCC)    hx of 30 years ago   Pancreatitis    TIA (transient ischemic attack)    8 years ago    Past Surgical History:  Procedure Laterality Date   BREAST BIOPSY Left    [place]   CARDIAC  CATHETERIZATION     10/2013   CATARACT EXTRACTION Bilateral 03/22/2018   FLEXIBLE SIGMOIDOSCOPY N/A 08/13/2022   Procedure: FLEXIBLE SIGMOIDOSCOPY;  Surgeon: Sheldon Standing, MD;  Location: [facility];  Service: General;  Laterality: N/A;   ILEOSTOMY CLOSURE N/A 11/19/2022   Procedure: OPEN TAKEDOWN OF LOOP ILEOSTOMY;  Surgeon: Sheldon Standing, MD;  Location: [facility];  Service: General;  Laterality: N/A;   LEFT HEART CATHETERIZATION WITH CORONARY ANGIOGRAM N/A 10/25/2013   Procedure: LEFT HEART CATHETERIZATION WITH CORONARY ANGIOGRAM;  Surgeon: Debby DELENA Sor, MD;  Location: [facility] CATH LAB;  Service: Cardiovascular;  Laterality: N/A;   LYSIS OF ADHESION N/A 08/13/2022   Procedure: LYSIS OF ADHESION, DIVERTING ILEOSTOMY;  Surgeon: Sheldon Standing, MD;  Location: [facility];  Service: General;  Laterality: N/A;   NECK SURGERY     ACDF x 2      done in [place] more than 25 years, doesn't know levels   RHINOPLASTY  08/16/2023   uses [place] in her nostril   TOTAL KNEE ARTHROPLASTY     Bilateral x's 2   VAGINAL HYSTERECTOMY  10/17/1998   [name]   VIDEO BRONCHOSCOPY Bilateral 01/24/2018   Procedure: VIDEO BRONCHOSCOPY WITH FLUORO;  Surgeon: Alaine Vicenta NOVAK, MD;  Location: [facility] ENDOSCOPY;  Service: Cardiopulmonary;  Laterality: Bilateral;   XI ROBOTIC ASSISTED COLOSTOMY TAKEDOWN N/A 08/13/2022  Procedure: ROBOTIC OSTOMY TAKEDOWN, SMALL BOWEL RESECTION X 2, BILATERAL TAP BLOCK, TISSUE PERFUSSION ASSESMENT VIA FIREFLY, MOBILAZATION OF SPLENIC FLEXURE;  Surgeon: Sheldon Standing, MD;  Location: [facility];  Service: General;  Laterality: N/A;   XI ROBOTIC ASSISTED LOWER ANTERIOR RESECTION N/A 03/31/2022   Procedure: XI ROBOTIC ASSISTED LOWER ANTERIOR RESECTION;  Surgeon: Sheldon Standing, MD;  Location: [facility];  Service: General;  Laterality: N/A;    Family History  Problem Relation Age of Onset   Ovarian cancer Mother    Uterine cancer Mother    Lung cancer Father    HIV Brother        75   Kidney cancer Brother     Lung cancer Brother    Other Brother        Mouth Cancer   Colon cancer Maternal Aunt    Heart disease Maternal Grandmother    Stroke Maternal Grandmother    Hypertension Maternal Grandmother    Diabetes Maternal Grandmother    Arthritis Other    Esophageal cancer Neg Hx    Liver disease Neg Hx    Rectal cancer Neg Hx    Stomach cancer Neg Hx     Social History   Socioeconomic History   Marital status: Married    Spouse name: [name]   Number of children: 3   Years of education: Jr college   Highest education level: Not on file  Occupational History   Occupation: Leisure Centre Manager: UNEMPLOYED   Occupation: retired  Tobacco Use   Smoking status: Never   Smokeless tobacco: Never  Vaping Use   Vaping status: Never Used  Substance and Sexual Activity   Alcohol  use: No    Alcohol /week: 0.0 standard drinks of alcohol    Drug use: No   Sexual activity: Not Currently  Other Topics Concern   Not on file  Social History Narrative   Lives with husband [name]   Caffeine use: coffee (2 cups per day)   Mostly right-handed   Social Drivers of Corporate Investment Banker Strain: Not on file  Food Insecurity: No Food Insecurity (01/31/2024)   Hunger Vital Sign    Worried About Running Out of Food in the Last Year: Never true    Ran Out of Food in the Last Year: Never true  Transportation Needs: No Transportation Needs (01/31/2024)   PRAPARE - Administrator, Civil Service (Medical): No    Lack of Transportation (Non-Medical): No  Physical Activity: Not on file  Stress: Not on file  Social Connections: Unknown (01/24/2024)   Social Connection and Isolation Panel    Frequency of Communication with Friends and Family: Patient declined    Frequency of Social Gatherings with Friends and Family: Patient declined    Attends Religious Services: Patient declined    Database Administrator or Organizations: Patient declined    Attends Banker Meetings:  Patient declined    Marital Status: Married  Catering Manager Violence: Not At Risk (01/31/2024)   Humiliation, Afraid, Rape, and Kick questionnaire    Fear of Current or Ex-Partner: No    Emotionally Abused: No    Physically Abused: No    Sexually Abused: No    Outpatient Medications Prior to Visit  Medication Sig Dispense Refill   azelastine  (ASTELIN ) 0.1 % nasal spray Place 1 spray into both nostrils daily as needed for rhinitis. Use in each nostril as directed     benzonatate  (TESSALON ) 200 MG capsule Take  1 capsule (200 mg total) by mouth 3 (three) times daily as needed for cough. 20 capsule 3   diphenoxylate -atropine  (LOMOTIL ) 2.5-0.025 MG tablet Take 2 tablets by mouth 3 (three) times daily as needed for diarrhea or loose stools. 30 tablet 0   esomeprazole  (NEXIUM ) 40 MG capsule Take 1 capsule (40 mg total) by mouth daily at 12 noon. 30 capsule 0   furosemide  (LASIX ) 40 MG tablet Take 1 tablet (40 mg total) by mouth daily. 30 tablet 0   levalbuterol  (XOPENEX ) 1.25 MG/3ML nebulizer solution Take 1.25 mg by nebulization every 6 (six) hours as needed for wheezing or shortness of breath. 150 mL 1   mirtazapine (REMERON SOL-TAB) 15 MG disintegrating tablet Take 1 tablet (15 mg total) by mouth at bedtime. 30 tablet 2   Multiple Vitamin (MULTIVITAMIN WITH MINERALS) TABS tablet Take 1 tablet by mouth every Monday, Wednesday, and Friday.     NONFORMULARY OR COMPOUNDED ITEM Portable oxygen via [place] 2L Dx hypoxia, pulnonary fibrosis 1 each 1   ondansetron  (ZOFRAN ) 4 MG tablet Take 1 tablet (4 mg total) by mouth every 8 (eight) hours as needed. 20 tablet 1   OXYGEN Inhale 2 L/min into the lungs continuous.     potassium chloride  SA (KLOR-CON  M) 20 MEQ tablet Take 1 tablet (20 mEq total) by mouth daily. 30 tablet 0   promethazine -dextromethorphan (PROMETHAZINE -DM) 6.25-15 MG/5ML syrup TAKE BY MOUTH 4 TIMES A DAY AS NEEDED 118 mL 0   Respiratory Therapy Supplies (FULL KIT NEBULIZER SET) MISC  Use with DUONEB for COPD treatment 1 each 0   rosuvastatin (CRESTOR) 20 MG tablet Take 1 tablet (20 mg total) by mouth daily. 30 tablet 0   spironolactone  (ALDACTONE ) 25 MG tablet Take 1 tablet (25 mg total) by mouth daily. 30 tablet 1   SYNTHROID  112 MCG tablet Take 1 tablet (112 mcg total) by mouth daily before breakfast. 90 tablet 0   vitamin C  (ASCORBIC ACID ) 500 MG tablet Take 500 mg by mouth every other day.     No facility-administered medications prior to visit.    Allergies  Allergen Reactions   Influenza Vaccines Other (See Comments)    Pt reports heart attack after flu shot   Prednisone  Other (See Comments)    High doses caused mental status changes.   Dilaudid  [Hydromorphone ] Itching and Other (See Comments)    Dilaudid  caused marked confusion   Ipratropium Bromide  Itching and Other (See Comments)    Mental Status Changes, Insomnia   Cheratussin Ac [Guaifenesin -Codeine ] Other (See Comments)    Headaches - patient denies   Codeine  Itching, Nausea Only and Other (See Comments)    Pt takes promethazine  with codeine  at home   Diltiazem  Nausea And Vomiting   Hydrocodone Itching   Ibuprofen  Itching and Other (See Comments)    Gives false reading in blood   Morphine  Itching   Oxycodone  Itching    ROS     Objective:    Physical Exam  There were no vitals taken for this visit. Wt Readings from Last 3 Encounters:  01/30/24 123 lb 7.3 oz (56 kg)  11/01/23 128 lb (58.1 kg)  10/12/23 133 lb (60.3 kg)       Assessment & Plan:  There are no diagnoses linked to this encounter.  Assessment and Plan Assessment & Plan       I discussed the assessment and treatment plan with the patient. The patient was provided an opportunity to ask questions and all were answered.  The patient agreed with the plan and demonstrated an understanding of the instructions.   The patient was advised to call back or seek an in-person evaluation if the symptoms worsen or if the condition  fails to improve as anticipated.  [name] Antonio Cyndee, DO [facility] [phone] (phone) [phone] (fax)  [facility]

## 2024-02-18 NOTE — Telephone Encounter (Signed)
 Copied from CRM [id number]. Topic: Clinical - Medication Refill >> Feb 18, 2024  1:57 PM Suzen RAMAN wrote: Medication: promethazine -dextromethorphan (PROMETHAZINE -DM) 6.25-15 MG/5ML syrup  Has the patient contacted their pharmacy? Yes   This is the patient's preferred pharmacy:  [id number] [place], [place] - [address]OSS [address] [place] [address] Phone: [phone] Fax: [phone]  Is this the correct pharmacy for this prescription? Yes If no, delete pharmacy and type the correct one.   Has the prescription been filled recently? Yes  Is the patient out of the medication? No  Has the patient been seen for an appointment in the last year OR does the patient have an upcoming appointment? Yes  Can we respond through MyChart? Yes  Agent: Please be advised that Rx refills may take up to 3 business days. We ask that you follow-up with your pharmacy.

## 2024-02-18 NOTE — Telephone Encounter (Signed)
 Copied from CRM #[id number]. Topic: General - Other >> Feb 18, 2024  2:00 PM Alexandria [name] wrote: Reason for CRM: patient is requesting order/rx for her portable O2 be resent to [facility] medical supplies as they're stating they didn't received anything on 02/11/24

## 2024-02-21 ENCOUNTER — Ambulatory Visit: Payer: Self-pay | Admitting: Family Medicine

## 2024-02-21 ENCOUNTER — Telehealth: Payer: Self-pay | Admitting: Family Medicine

## 2024-02-21 DIAGNOSIS — J84112 Idiopathic pulmonary fibrosis: Secondary | ICD-10-CM | POA: Diagnosis not present

## 2024-02-21 DIAGNOSIS — J9601 Acute respiratory failure with hypoxia: Secondary | ICD-10-CM | POA: Diagnosis not present

## 2024-02-21 DIAGNOSIS — I351 Nonrheumatic aortic (valve) insufficiency: Secondary | ICD-10-CM | POA: Diagnosis not present

## 2024-02-21 DIAGNOSIS — J479 Bronchiectasis, uncomplicated: Secondary | ICD-10-CM | POA: Diagnosis not present

## 2024-02-21 DIAGNOSIS — I110 Hypertensive heart disease with heart failure: Secondary | ICD-10-CM | POA: Diagnosis not present

## 2024-02-21 DIAGNOSIS — I5033 Acute on chronic diastolic (congestive) heart failure: Secondary | ICD-10-CM | POA: Diagnosis not present

## 2024-02-21 MED ORDER — PROMETHAZINE-DM 6.25-15 MG/5ML PO SYRP: 5.0000 mL | ORAL_SOLUTION | Freq: Four times a day (QID) | ORAL | 0 refills | Status: DC | PRN

## 2024-02-21 NOTE — Telephone Encounter (Signed)
Refill request has been sent to PCP. 

## 2024-02-21 NOTE — Telephone Encounter (Signed)
 Called and spoke with patient. Patient states that she does not remember having a good response to Flagyl  prescription. Patient did not request refill, states that it was an automatic pharmacy request. Patient had no other concerns at this time.

## 2024-02-21 NOTE — Telephone Encounter (Signed)
 Attempt made to call patient, no answer. Left voicemail for patient to return call for nurse triage. Promethazine -DM is a medication she regularly has refilled. Refill request was put in on 02/18/24, still within the 3 business day window to be addressed by PCP. No updates on medication refill request. Routing to clinic.    Copied from CRM [id number]. Topic: Clinical - Medication Refill >> Feb 18, 2024  1:57 PM Suzen RAMAN wrote: Medication: promethazine -dextromethorphan (PROMETHAZINE -DM) 6.25-15 MG/5ML syrup  Has the patient contacted their pharmacy? Yes   This is the patient's preferred pharmacy:  [id number] [place], [place] - [address] [place]  [address] Phone: [phone] Fax: [phone]  Is this the correct pharmacy for this prescription? Yes If no, delete pharmacy and type the correct one.   Has the prescription been filled recently? Yes  Is the patient out of the medication? No  Has the patient been seen for an appointment in the last year OR does the patient have an upcoming appointment? Yes  Can we respond through MyChart? Yes  Agent: Please be advised that Rx refills may take up to 3 business days. We ask that you follow-up with your pharmacy. >> Feb 21, 2024  9:15 AM Donna BRAVO wrote: Patient asking for update on promethazine -dextromethorphan (PROMETHAZINE -DM) 6.25-15 MG/5ML syrup

## 2024-02-21 NOTE — Telephone Encounter (Unsigned)
 Copied from CRM #[id number]. Topic: Clinical - Refused Triage >> Feb 21, 2024  9:12 AM Donna BRAVO wrote: Patient/caller voiced complaints of Symptoms: -Took mirtazapine (REMERON SOL-TAB) 15 MG disintegrating tablet  Friday night -Bad reaction -Short of breath -Paniced because of breathing -Did not sleep at all -Weird feeling, shaking  -Anxiety is still very high. Declined transfer to triage.  Patient would like to know what she should do next

## 2024-02-23 NOTE — Telephone Encounter (Signed)
 Orders faxed

## 2024-02-24 DIAGNOSIS — J479 Bronchiectasis, uncomplicated: Secondary | ICD-10-CM | POA: Diagnosis not present

## 2024-02-24 DIAGNOSIS — I351 Nonrheumatic aortic (valve) insufficiency: Secondary | ICD-10-CM | POA: Diagnosis not present

## 2024-02-24 DIAGNOSIS — J9601 Acute respiratory failure with hypoxia: Secondary | ICD-10-CM | POA: Diagnosis not present

## 2024-02-24 DIAGNOSIS — I5033 Acute on chronic diastolic (congestive) heart failure: Secondary | ICD-10-CM | POA: Diagnosis not present

## 2024-02-24 DIAGNOSIS — E039 Hypothyroidism, unspecified: Secondary | ICD-10-CM | POA: Diagnosis not present

## 2024-02-24 DIAGNOSIS — J84112 Idiopathic pulmonary fibrosis: Secondary | ICD-10-CM | POA: Diagnosis not present

## 2024-02-24 DIAGNOSIS — I2510 Atherosclerotic heart disease of native coronary artery without angina pectoris: Secondary | ICD-10-CM | POA: Diagnosis not present

## 2024-02-24 DIAGNOSIS — I719 Aortic aneurysm of unspecified site, without rupture: Secondary | ICD-10-CM | POA: Diagnosis not present

## 2024-02-24 DIAGNOSIS — I110 Hypertensive heart disease with heart failure: Secondary | ICD-10-CM | POA: Diagnosis not present

## 2024-02-24 DIAGNOSIS — I2721 Secondary pulmonary arterial hypertension: Secondary | ICD-10-CM | POA: Diagnosis not present

## 2024-02-24 DIAGNOSIS — I700 Atherosclerosis of aorta: Secondary | ICD-10-CM | POA: Diagnosis not present

## 2024-02-24 DIAGNOSIS — E785 Hyperlipidemia, unspecified: Secondary | ICD-10-CM | POA: Diagnosis not present

## 2024-02-24 NOTE — Telephone Encounter (Signed)
 Pt states she will try half of the Remeron  and will call back if she wants to switch to Zoloft.

## 2024-02-25 DIAGNOSIS — J479 Bronchiectasis, uncomplicated: Secondary | ICD-10-CM | POA: Diagnosis not present

## 2024-02-25 DIAGNOSIS — I110 Hypertensive heart disease with heart failure: Secondary | ICD-10-CM | POA: Diagnosis not present

## 2024-02-25 DIAGNOSIS — I5033 Acute on chronic diastolic (congestive) heart failure: Secondary | ICD-10-CM | POA: Diagnosis not present

## 2024-02-25 DIAGNOSIS — J9601 Acute respiratory failure with hypoxia: Secondary | ICD-10-CM | POA: Diagnosis not present

## 2024-02-25 DIAGNOSIS — J84112 Idiopathic pulmonary fibrosis: Secondary | ICD-10-CM | POA: Diagnosis not present

## 2024-02-25 DIAGNOSIS — I351 Nonrheumatic aortic (valve) insufficiency: Secondary | ICD-10-CM | POA: Diagnosis not present

## 2024-02-28 ENCOUNTER — Telehealth: Payer: Self-pay | Admitting: Family Medicine

## 2024-02-28 NOTE — Telephone Encounter (Signed)
 Copied from CRM [id number]. Topic: Referral - Question >> Feb 28, 2024 11:18 AM [name] wrote: Reason for CRM: Patient has questions in regards to seeking out a home health nurse aide. She is not sure where to look.   [phone] (M)

## 2024-02-29 ENCOUNTER — Other Ambulatory Visit: Payer: Self-pay | Admitting: Family Medicine

## 2024-02-29 DIAGNOSIS — I110 Hypertensive heart disease with heart failure: Secondary | ICD-10-CM | POA: Diagnosis not present

## 2024-02-29 DIAGNOSIS — J84112 Idiopathic pulmonary fibrosis: Secondary | ICD-10-CM

## 2024-02-29 DIAGNOSIS — I5033 Acute on chronic diastolic (congestive) heart failure: Secondary | ICD-10-CM | POA: Diagnosis not present

## 2024-02-29 DIAGNOSIS — I351 Nonrheumatic aortic (valve) insufficiency: Secondary | ICD-10-CM | POA: Diagnosis not present

## 2024-02-29 DIAGNOSIS — J479 Bronchiectasis, uncomplicated: Secondary | ICD-10-CM | POA: Diagnosis not present

## 2024-02-29 DIAGNOSIS — E039 Hypothyroidism, unspecified: Secondary | ICD-10-CM

## 2024-02-29 DIAGNOSIS — J9601 Acute respiratory failure with hypoxia: Secondary | ICD-10-CM | POA: Diagnosis not present

## 2024-02-29 NOTE — Telephone Encounter (Signed)
Pt notified that referral has been placed

## 2024-02-29 NOTE — Progress Notes (Unsigned)
 Cardiology Office Note   Date:  03/01/2024  ID:  Dorothey, Oetken May 22, 1945, MRN [id number] PCP: Antonio Meth, [name], DO  [facility] Providers Cardiologist:  Jerel Balding, MD   History of Present Illness Alexandria [name] is a 78 y.o. female with  ILD, chronic venous insufficiency, asthma, hypothyroidism, HFpEF, severe diverticulitis s/p partial colectomy and colostomy who presents with ongoing shortness of breath and stable palpitations.  She was seen 11/01/23:   She experiences shortness of breath when walking short distances, such as 20 to 30 feet, necessitating rest. She attributes her breathing difficulties to a recent nasal surgery causing nasal obstruction and dryness, making nasal breathing difficult. The air she breaths feels cold and painful, adding to her discomfort. She has an upcoming appointment with her nasal surgeon in September to address these issues.   She continues to experience palpitations, although a heart monitor previously placed did not reveal any significant arrhythmias. The frequency of palpitations has not increased since her last visit and overall feels this issue is stable/well managed.    She has a history of interstitial lung disease with chronic shortness of breath. Over the years, has seen providers with [facility], [facility]. She also reports traveling to see a [facility] provider. She has an appointment with her pulmonologist at [facility] this week. She expresses frustration that a pulmonologist at [facility] informed her rather bluntly that her condition was worsening/would be fatal.    She is currently taking amlodipine  5 mg for blood pressure but has not taken it in the past few days due to being on antibiotics and not wanting to take multiple pills at once. She also takes Lasix  (furosemide ) 20 mg every three days, with potassium supplementation, for leg swelling. This was prescribed by her PCP. She says she stopped taking losartan  about  six months ago.    She has a history of severe diverticulitis, for which she underwent partial colectomy and colostomy. Her colon was reconnected in August of last year.  Today, she has a hx of  diastolic dysfunction and interstitial pulmonary fibrosis who presents with shortness of breath and chest pain. She is accompanied by her daughter, Ronal Pries.  She was recently hospitalized for shortness of breath and chest pain and was started on home oxygen. At home she intermittently removes the oxygen, and her saturations remain 95-97% even when she feels short of breath. She has a persistent cough that began with the hospitalization.  She has stage one diastolic dysfunction. She takes 20 mg of diuretic every three days because higher doses caused dizziness and dehydration. She takes potassium supplements for chronically low potassium, which remains unstable.  Her chest pain starts under the left breast and radiates across the chest. She is not using nitroglycerin  or aspirin  and avoids medications unless specifically advised. A calcium  score in 2021 was zero with no coronary artery disease reported at that time.  She reports notable weight loss since May, from 137 pounds to her current weight, which she associates with fluid loss during the hospital stay. She reports ongoing weight fluctuations since discharge.  She has interstitial pulmonary fibrosis. She reports a remote heart attack at age 87 with residual unilateral leg pain.  She experiences anxiety that worsens her breathing. She was prescribed mirtazapine  for anxiety but only takes it every other night because it makes her feel wired and causes nighttime awakening.  No edema, orthopnea, PND. Reports no palpitations.    ROS: pertinent ROS in HPI  Studies Reviewed     Echo 12/07/23 IMPRESSIONS     1. Left ventricular ejection fraction, by estimation, is 60 to 65%. The  left ventricle has normal function. The left ventricle has no regional   wall motion abnormalities. There is mild left ventricular hypertrophy.  Left ventricular diastolic parameters  are indeterminate.   2. Right ventricular systolic function is mildly reduced. The right  ventricular size is normal. There is normal pulmonary artery systolic  pressure. The estimated right ventricular systolic pressure is 26.0 mmHg.   3. The mitral valve is degenerative. Trivial mitral valve regurgitation.  No evidence of mitral stenosis. Moderate mitral annular calcification.   4. The aortic valve is tricuspid. There is mild thickening of the aortic  valve. Aortic valve regurgitation is possibly severe, it appears  eccentric, and Doppler parameters may underestimate. Consider cardiac MRI  to quantitate AI. Of note, BP higher on  today's study compared to prior exam. No aortic stenosis is present.   5. Aortic dilatation noted. There is mild dilatation of the ascending  aorta, measuring 42 mm.   6. The inferior vena cava is normal in size with greater than 50%  respiratory variability, suggesting right atrial pressure of 3 mmHg.   Comparison(s): Prior images reviewed side by side. AR appears to haved  increased. BP higher today.       Physical Exam VS:  BP 138/84   Pulse 88   Ht 5' 4 (1.626 m)   Wt 122 lb (55.3 kg)   SpO2 98%   BMI 20.94 kg/m        Wt Readings from Last 3 Encounters:  03/01/24 122 lb (55.3 kg)  01/30/24 123 lb 7.3 oz (56 kg)  11/01/23 128 lb (58.1 kg)    GEN: Well nourished, well developed in no acute distress NECK: No JVD; No carotid bruits CARDIAC: RRR with PACs, no murmurs, rubs, gallops RESPIRATORY:  Clear to auscultation without rales, wheezing or rhonchi  ABDOMEN: Soft, non-tender, non-distended EXTREMITIES:  No edema; No deformity   ASSESSMENT AND PLAN  Chronic diastolic heart failure with preserved ejection fraction (stage 1 diastolic dysfunction) Stage 1 diastolic dysfunction causing shortness of breath. Systolic function normal.  Diuretics effective for fluid management. - Continue diuretics every three days with potassium supplementation. -euvolemic on exam today and weight has been stable since discharge  Interstitial pulmonary fibrosis Fibrotic changes on chest x-ray. Oxygen therapy initiated for shortness of breath. Oxygen saturation stable at 95-97%. - Continue intermittent oxygen therapy as needed. - Monitor oxygen saturation at home.  Hyperlipidemia with elevated LDL, statin intolerance, and initiation of PCSK9 inhibitor therapy LDL at 167 mg/dL. Statin intolerance noted. Repatha discussed for LDL reduction. Self-administration of Repatha every 14 days explained. - Initiated Repatha (PCSK9 inhibitor) therapy. - Continue rosuvastatin  every other day until lipid panel results are available. - Recheck lipid panel in 8 weeks.  Precordial chest pain and palpitations with evaluation for coronary artery disease and atrial premature contractions Intermittent chest pain and occasional PACs. Previous calcium  score zero. Anxiety may exacerbate symptoms. Coronary CTA discussed. - Ordered coronary CTA to evaluate for coronary artery disease. - Monitor for irregular pulse or slow heart rate on finger oximeter.  Chronic cough post-hospitalization Persistent cough post-hospitalization. - Continue Tessalon  Pearls and liquid cough medicine as needed. -CXR without pneumonia or pleural effusion  Generalized anxiety disorder Anxiety contributing to chest pain and shortness of breath. Previous mirtazapine  trial had adverse effects. - Continue mirtazapine  every other night if tolerated. -  Discuss alternative anxiety management options with primary care provider.  Essential hypertension Blood pressure well-controlled on current regimen. - Continue current antihypertensive regimen.  Ascending aortic aneurysm 41 mm -she receives annual CT scan and her last one was reviewed from May and was stable     Dispo: She can return  in 6 months to see Dr. Francyne or myself  Signed, Orren LOISE Fabry, PA-C

## 2024-03-01 ENCOUNTER — Ambulatory Visit: Attending: Physician Assistant | Admitting: Physician Assistant

## 2024-03-01 VITALS — BP 138/84 | HR 88 | Ht 64.0 in | Wt 122.0 lb

## 2024-03-01 DIAGNOSIS — I4719 Other supraventricular tachycardia: Secondary | ICD-10-CM | POA: Insufficient documentation

## 2024-03-01 DIAGNOSIS — I10 Essential (primary) hypertension: Secondary | ICD-10-CM | POA: Diagnosis not present

## 2024-03-01 DIAGNOSIS — I5033 Acute on chronic diastolic (congestive) heart failure: Secondary | ICD-10-CM | POA: Diagnosis not present

## 2024-03-01 DIAGNOSIS — R000 Tachycardia, unspecified: Secondary | ICD-10-CM | POA: Insufficient documentation

## 2024-03-01 DIAGNOSIS — K219 Gastro-esophageal reflux disease without esophagitis: Secondary | ICD-10-CM | POA: Diagnosis not present

## 2024-03-01 DIAGNOSIS — I77810 Thoracic aortic ectasia: Secondary | ICD-10-CM | POA: Insufficient documentation

## 2024-03-01 DIAGNOSIS — I5032 Chronic diastolic (congestive) heart failure: Secondary | ICD-10-CM | POA: Diagnosis not present

## 2024-03-01 DIAGNOSIS — J84112 Idiopathic pulmonary fibrosis: Secondary | ICD-10-CM | POA: Diagnosis not present

## 2024-03-01 DIAGNOSIS — R002 Palpitations: Secondary | ICD-10-CM | POA: Diagnosis not present

## 2024-03-01 DIAGNOSIS — K589 Irritable bowel syndrome without diarrhea: Secondary | ICD-10-CM | POA: Diagnosis not present

## 2024-03-01 DIAGNOSIS — E785 Hyperlipidemia, unspecified: Secondary | ICD-10-CM | POA: Diagnosis not present

## 2024-03-01 DIAGNOSIS — I719 Aortic aneurysm of unspecified site, without rupture: Secondary | ICD-10-CM | POA: Diagnosis not present

## 2024-03-01 DIAGNOSIS — I351 Nonrheumatic aortic (valve) insufficiency: Secondary | ICD-10-CM | POA: Insufficient documentation

## 2024-03-01 DIAGNOSIS — Z9981 Dependence on supplemental oxygen: Secondary | ICD-10-CM | POA: Diagnosis not present

## 2024-03-01 DIAGNOSIS — J849 Interstitial pulmonary disease, unspecified: Secondary | ICD-10-CM | POA: Diagnosis not present

## 2024-03-01 DIAGNOSIS — I2510 Atherosclerotic heart disease of native coronary artery without angina pectoris: Secondary | ICD-10-CM | POA: Diagnosis not present

## 2024-03-01 DIAGNOSIS — J479 Bronchiectasis, uncomplicated: Secondary | ICD-10-CM | POA: Diagnosis not present

## 2024-03-01 DIAGNOSIS — Z79899 Other long term (current) drug therapy: Secondary | ICD-10-CM | POA: Diagnosis not present

## 2024-03-01 DIAGNOSIS — I4510 Unspecified right bundle-branch block: Secondary | ICD-10-CM | POA: Insufficient documentation

## 2024-03-01 DIAGNOSIS — I700 Atherosclerosis of aorta: Secondary | ICD-10-CM | POA: Diagnosis not present

## 2024-03-01 DIAGNOSIS — I491 Atrial premature depolarization: Secondary | ICD-10-CM | POA: Diagnosis not present

## 2024-03-01 DIAGNOSIS — R072 Precordial pain: Secondary | ICD-10-CM | POA: Insufficient documentation

## 2024-03-01 DIAGNOSIS — J9601 Acute respiratory failure with hypoxia: Secondary | ICD-10-CM | POA: Diagnosis not present

## 2024-03-01 DIAGNOSIS — Z9181 History of falling: Secondary | ICD-10-CM | POA: Diagnosis not present

## 2024-03-01 DIAGNOSIS — I2721 Secondary pulmonary arterial hypertension: Secondary | ICD-10-CM | POA: Diagnosis not present

## 2024-03-01 DIAGNOSIS — Z8673 Personal history of transient ischemic attack (TIA), and cerebral infarction without residual deficits: Secondary | ICD-10-CM | POA: Diagnosis not present

## 2024-03-01 DIAGNOSIS — I110 Hypertensive heart disease with heart failure: Secondary | ICD-10-CM | POA: Diagnosis not present

## 2024-03-01 DIAGNOSIS — E039 Hypothyroidism, unspecified: Secondary | ICD-10-CM | POA: Diagnosis not present

## 2024-03-01 MED ORDER — METOPROLOL TARTRATE 50 MG PO TABS: ORAL_TABLET | ORAL | 0 refills | Status: DC

## 2024-03-01 NOTE — Patient Instructions (Addendum)
 Medication Instructions:  Your physician recommends that you continue on your current medications as directed. Please refer to the Current Medication list given to you today.  *If you need a refill on your cardiac medications before your next appointment, please call your pharmacy*  Lab Work: TODAY-BMET  AT YOUR CONVENIENCE FASTING LIPIDS If you have labs (blood work) drawn today and your tests are completely normal, you will receive your results only by: MyChart Message (if you have MyChart) OR A paper copy in the mail If you have any lab test that is abnormal or we need to change your treatment, we will call you to review the results.  Testing/Procedures: CORONARY CTA   Follow-Up: At [facility], you and your health needs are our priority.  As part of our continuing mission to provide you with exceptional heart care, our providers are all part of one team.  This team includes your primary Cardiologist (physician) and Advanced Practice Providers or APPs (Physician Assistants and Nurse Practitioners) who all work together to provide you with the care you need, when you need it.  Your next appointment:   6 month(s)  Provider:   Jerel Balding, MD    We recommend signing up for the patient portal called MyChart.  Sign up information is provided on this After Visit Summary.  MyChart is used to connect with patients for Virtual Visits (Telemedicine).  Patients are able to view lab/test results, encounter notes, upcoming appointments, etc.  Non-urgent messages can be sent to your provider as well.   To learn more about what you can do with MyChart, go to [email or web address].   Other Instructions     Your cardiac CT will be scheduled at one of the below locations:   [facility] [address] [place], [place] [address] [phone]  OR   [facility] [address]  [place], [place] [address]  If scheduled at the [facility] at [address], please enter the parking lot using the [address] entrance and use the FREE valet service at the patient drop-off area. Enter the building and check-in with registration on the main floor.  If scheduled at [facility], please arrive 30 minutes early for check-in and test prep.  Please follow these instructions carefully (unless otherwise directed):  An IV will be required for this test and Nitroglycerin  will be given.   On the Night Before the Test: Be sure to Drink plenty of water . Do not consume any caffeinated/decaffeinated beverages or chocolate 12 hours prior to your test. Do not take any antihistamines 12 hours prior to your test.  On the Day of the Test: Drink plenty of water  until 1 hour prior to the test. Do not eat any food 1 hour prior to test. You may take your regular medications prior to the test.  Take metoprolol  (Lopressor ) two hours prior to test. If you take Furosemide /Hydrochlorothiazide/Spironolactone /Chlorthalidone, please HOLD on the morning of the test. Patients who wear a continuous glucose monitor MUST remove the device prior to scanning. FEMALES- please wear underwire-free bra if available, avoid dresses & tight clothing   After the Test: Drink plenty of water . After receiving IV contrast, you may experience a mild flushed feeling. This is normal. On occasion, you may experience a mild rash up to 24 hours after the test. This is not dangerous. If this occurs, you can take Benadryl  25 mg, Zyrtec, Claritin, or Allegra and increase your fluid  intake. (Patients taking Tikosyn should avoid Benadryl , and may take Zyrtec, Claritin, or Allegra) If you experience trouble breathing, this can be serious. If it is severe call 911 IMMEDIATELY. If it is mild, please call our office.  We will call to schedule your test 2-4 weeks out understanding that some insurance companies will need an authorization prior to the service  being performed.   For more information and frequently asked questions, please visit our website : [email or web address]  For non-scheduling related questions, please contact the cardiac imaging nurse navigator should you have any questions/concerns: Cardiac Imaging Nurse Navigators Direct Office Dial: [phone]   For scheduling needs, including cancellations and rescheduling, please call Brittany, [phone].

## 2024-03-02 LAB — BASIC METABOLIC PANEL WITH GFR
BUN/Creatinine Ratio: 15 (ref 12–28)
BUN: 11 mg/dL (ref 8–27)
CO2: 27 mmol/L (ref 20–29)
Calcium: 9.8 mg/dL (ref 8.7–10.3)
Chloride: 97 mmol/L (ref 96–106)
Creatinine, Ser: 0.75 mg/dL (ref 0.57–1.00)
Glucose: 88 mg/dL (ref 70–99)
Potassium: 4.3 mmol/L (ref 3.5–5.2)
Sodium: 139 mmol/L (ref 134–144)
eGFR: 81 mL/min/1.73 (ref >59)

## 2024-03-03 ENCOUNTER — Other Ambulatory Visit (HOSPITAL_COMMUNITY): Payer: Self-pay

## 2024-03-03 ENCOUNTER — Telehealth: Payer: Self-pay | Admitting: Pharmacy Technician

## 2024-03-03 DIAGNOSIS — I5033 Acute on chronic diastolic (congestive) heart failure: Secondary | ICD-10-CM | POA: Diagnosis not present

## 2024-03-03 DIAGNOSIS — J84112 Idiopathic pulmonary fibrosis: Secondary | ICD-10-CM | POA: Diagnosis not present

## 2024-03-03 DIAGNOSIS — I351 Nonrheumatic aortic (valve) insufficiency: Secondary | ICD-10-CM | POA: Diagnosis not present

## 2024-03-03 DIAGNOSIS — J9601 Acute respiratory failure with hypoxia: Secondary | ICD-10-CM | POA: Diagnosis not present

## 2024-03-03 DIAGNOSIS — J479 Bronchiectasis, uncomplicated: Secondary | ICD-10-CM | POA: Diagnosis not present

## 2024-03-03 DIAGNOSIS — I110 Hypertensive heart disease with heart failure: Secondary | ICD-10-CM | POA: Diagnosis not present

## 2024-03-03 NOTE — Telephone Encounter (Signed)
   Pharmacy Patient Advocate Encounter   Received notification from Pt Calls Messages that prior authorization for REPATHA is required/requested.   Insurance verification completed.   The patient is insured through [employer].   Per test claim: PA required; PA submitted to above mentioned insurance via Latent Key/confirmation #/[id number] Status is pending

## 2024-03-03 NOTE — Telephone Encounter (Signed)
 Pharmacy Patient Advocate Encounter  Received notification from [facility] that Prior Authorization for repatha has been APPROVED from 03/03/24 to 03/03/25. Ran test claim, Copay is $0.00. This test claim was processed through [facility]- copay amounts may vary at other pharmacies due to pharmacy/plan contracts, or as the patient moves through the different stages of their insurance plan.   PA #/Case ID/Reference #: [id number]

## 2024-03-06 ENCOUNTER — Other Ambulatory Visit: Payer: Self-pay | Admitting: Physician Assistant

## 2024-03-06 MED ORDER — REPATHA SURECLICK 140 MG/ML ~~LOC~~ SOAJ: 140.0000 mg | SUBCUTANEOUS | 3 refills | Status: DC

## 2024-03-06 NOTE — Progress Notes (Signed)
 Repatha sent in.  Orren LOISE Fabry, PA-C

## 2024-03-07 ENCOUNTER — Ambulatory Visit: Payer: Self-pay | Admitting: Physician Assistant

## 2024-03-07 ENCOUNTER — Other Ambulatory Visit: Payer: Self-pay | Admitting: Family Medicine

## 2024-03-07 DIAGNOSIS — I351 Nonrheumatic aortic (valve) insufficiency: Secondary | ICD-10-CM | POA: Diagnosis not present

## 2024-03-07 DIAGNOSIS — J479 Bronchiectasis, uncomplicated: Secondary | ICD-10-CM | POA: Diagnosis not present

## 2024-03-07 DIAGNOSIS — J9601 Acute respiratory failure with hypoxia: Secondary | ICD-10-CM | POA: Diagnosis not present

## 2024-03-07 DIAGNOSIS — I110 Hypertensive heart disease with heart failure: Secondary | ICD-10-CM | POA: Diagnosis not present

## 2024-03-07 DIAGNOSIS — F419 Anxiety disorder, unspecified: Secondary | ICD-10-CM

## 2024-03-07 DIAGNOSIS — J84112 Idiopathic pulmonary fibrosis: Secondary | ICD-10-CM | POA: Diagnosis not present

## 2024-03-07 DIAGNOSIS — I5033 Acute on chronic diastolic (congestive) heart failure: Secondary | ICD-10-CM | POA: Diagnosis not present

## 2024-03-07 NOTE — Telephone Encounter (Addendum)
 Called patient regarding results, patient verbalized understanding  ----- Message from Orren LOISE Fabry sent at 03/07/2024  3:33 PM EST ----- Normal kidneys and electrolytes.  We will be in touch once your CT is completed.  Thanks!  Orren LOISE Fabry, PA-C  ----- Message ----- From: Rebecka Memos Lab Results In Sent: 03/01/2024  11:36 PM EST To: Orren LOISE Fabry, PA-C

## 2024-03-07 NOTE — Telephone Encounter (Signed)
 Copied from CRM [id number]. Topic: General - Other >> Mar 06, 2024  4:29 PM Delon DASEN wrote: Reason for CRM: did not receive call about home health nurses- [phone]

## 2024-03-07 NOTE — Telephone Encounter (Signed)
 Marcelina- are you able to check on [facility] referrals? I see Lowne placed one on 02/29/24 but she has not received a call.

## 2024-03-08 DIAGNOSIS — R0902 Hypoxemia: Secondary | ICD-10-CM | POA: Diagnosis not present

## 2024-03-08 DIAGNOSIS — J84112 Idiopathic pulmonary fibrosis: Secondary | ICD-10-CM | POA: Diagnosis not present

## 2024-03-10 NOTE — Telephone Encounter (Signed)
 Zegarra, Laila M, NT to Me (Selected Message)     03/09/24  2:48 PM I am not sure how exactly but I reached out to a contact and it sounds like they might still be reviewing it to ensure she is eligible and then will reach out to schedule. I checked the chart and saw no documentation of their first outreach to her. They might just be running behind a little.

## 2024-03-10 NOTE — Telephone Encounter (Signed)
 Noted

## 2024-03-13 ENCOUNTER — Telehealth: Payer: Self-pay

## 2024-03-13 DIAGNOSIS — J479 Bronchiectasis, uncomplicated: Secondary | ICD-10-CM | POA: Diagnosis not present

## 2024-03-13 DIAGNOSIS — J84112 Idiopathic pulmonary fibrosis: Secondary | ICD-10-CM | POA: Diagnosis not present

## 2024-03-13 DIAGNOSIS — I351 Nonrheumatic aortic (valve) insufficiency: Secondary | ICD-10-CM | POA: Diagnosis not present

## 2024-03-13 DIAGNOSIS — I5033 Acute on chronic diastolic (congestive) heart failure: Secondary | ICD-10-CM | POA: Diagnosis not present

## 2024-03-13 DIAGNOSIS — I110 Hypertensive heart disease with heart failure: Secondary | ICD-10-CM | POA: Diagnosis not present

## 2024-03-13 DIAGNOSIS — J9601 Acute respiratory failure with hypoxia: Secondary | ICD-10-CM | POA: Diagnosis not present

## 2024-03-13 NOTE — Progress Notes (Signed)
 Complex Care Management Note  Care Guide Note 03/13/2024 Name: Alexandria [name] MRN: [id number] DOB: 1945-11-22  Alexandria [name] is a 78 y.o. year old female who sees Antonio Meth, [name], DO for primary care. I reached out to Quanna J Banka by phone today to offer complex care management services.  [name] was given information about Complex Care Management services today including:   The Complex Care Management services include support from the care team which includes your Nurse Care Manager, Clinical Social Worker, or Pharmacist.  The Complex Care Management team is here to help remove barriers to the health concerns and goals most important to you. Complex Care Management services are voluntary, and the patient may decline or stop services at any time by request to their care team member.   Complex Care Management Consent Status: Patient agreed to services and verbal consent obtained.   Follow up plan:  Telephone appointment with complex care management team member scheduled for:  03/15/24 at 2:00 p.m.   Encounter Outcome:  Patient Scheduled  Dreama [facility]  [facility], [facility] VBCI Assistant Direct Dial: [phone]  Fax: [phone]

## 2024-03-15 ENCOUNTER — Telehealth: Payer: Self-pay

## 2024-03-15 ENCOUNTER — Ambulatory Visit: Admitting: Physician Assistant

## 2024-03-15 ENCOUNTER — Other Ambulatory Visit: Payer: Self-pay

## 2024-03-15 VITALS — Wt 120.0 lb

## 2024-03-15 DIAGNOSIS — F419 Anxiety disorder, unspecified: Secondary | ICD-10-CM

## 2024-03-15 NOTE — Telephone Encounter (Signed)
 Copied from CRM [id number]. Topic: Clinical - Medical Advice >> Mar 15, 2024  1:57 PM Mesmerise C wrote: Reason for CRM: Patient wanted to let Dr. Antonio Meth know she is very depressed and needs something to help calm her, tried to get to NT but had to go due to having an appt with [place] population in a couple minutes please call patient back to discuss

## 2024-03-15 NOTE — Patient Outreach (Signed)
 Complex Care Management   Visit Note  03/15/2024  Name:  Alexandria [name] MRN: [id number] DOB: April 18, 1945  Situation: Referral received for Complex Care Management related to Mental/Behavioral Health diagnosis anxiety and idiopathic pulmonary fibrosis I obtained verbal consent from Patient.  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 the left ventricular wall motion is normal   Hypothyroidism    IPF (idiopathic pulmonary fibrosis) (HCC) 01/2018   Myocardial infarction (HCC)    hx of 30 years ago   Pancreatitis    TIA (transient ischemic attack)    8 years ago    Assessment: Patient Reported Symptoms:  Cognitive Cognitive Status: Able to follow simple commands, Alert and oriented to person, place, and time, Normal speech and language skills Cognitive/Intellectual Conditions Management [RPT]: None reported or documented in medical history or problem list   Health Maintenance Behaviors: Annual physical exam Health Facilitated by: Rest  Neurological Neurological Review of Symptoms: No symptoms reported    HEENT HEENT Symptoms Reported: No symptoms reported HEENT Management Strategies: Routine screening    Cardiovascular Cardiovascular Symptoms Reported: No symptoms reported Does patient have uncontrolled Hypertension?: No Cardiovascular Management Strategies: Coping strategies, Medication therapy, Routine screening, Weight management Do You Have a Working  Readable Scale?: Yes Weight: 120 lb (54.4 kg) (Patient reported)  Respiratory Respiratory Symptoms Reported: Shortness of breath, Productive cough Other Respiratory Symptoms: Patient reports shortness of breath with minimal exertion. Patient reports white/clear mucus production with exertion. She reports wehezing once in awhile Additional Respiratory Details: Patient reports her respiratory symptoms began in May of this year. Patient reports she will be evaluated at [facility], she just needs to return their call to get scheduled. Patient reports she wears 2L oxygen  with exertion. Patient reports her oxygen  levels are typically 96% Respiratory Management Strategies: Oxygen  therapy, Breathing exercise, Adequate rest, Routine screening, Medication therapy  Endocrine Endocrine Symptoms Reported: No symptoms reported Is patient diabetic?: No    Gastrointestinal Gastrointestinal Symptoms Reported: Unintentional weight loss Additional Gastrointestinal Details: Patient reports a poor appetite with weight loss since May 2025. Patient reports last BM yesterday. Patient reports she takes immodium following surgery on her colon in 2024. Patient reports she no longer has ileostomy in place. Patient reports stools are regular Gastrointestinal Management Strategies: Coping strategies, Diet modification, Medication therapy    Genitourinary Genitourinary Symptoms Reported: No symptoms reported    Integumentary Integumentary Symptoms Reported: No symptoms reported    Musculoskeletal Musculoskelatal Symptoms Reviewed: No symptoms reported Musculoskeletal Management Strategies: Medical device, Adequate rest, Coping strategies (Rollator) Musculoskeletal Comment: Patient reports she is not able to exercise due to respiratory symptoms. She reports she was recently discharged from [facility] PT Falls in the past year?: No Number of falls in past year: 1 or less Was there an injury with Fall?: No Fall Risk Category Calculator:  0 Patient Fall Risk Level: Low Fall Risk Patient at Risk  for Falls Due to: No Fall Risks Fall risk Follow up: Falls evaluation completed, Education provided, Falls prevention discussed  Psychosocial Psychosocial Symptoms Reported: Anxiety - if selected complete GAD, Alteration in sleep habits Additional Psychological Details: Patient reports anxiety contributes to worsening shortness of breath. She has requested medication from PCP. Patient reports she does not meet with a counselor or therapist, but would be interested. LCSW referral placed. Behavioral Management Strategies: Coping strategies Major Change/Loss/Stressor/Fears (CP): Medical condition, self Techniques to Cope with Loss/Stress/Change: Diversional activities, Spiritual practice(s) Quality of Family Relationships: helpful, involved, stressful Do you feel physically threatened by others?: No    03/15/2024    PHQ2-9 Depression Screening   Little interest or pleasure in doing things Not at all  Feeling down, depressed, or hopeless Not at all  PHQ-2 - Total Score 0  Trouble falling or staying asleep, or sleeping too much    Feeling tired or having little energy    Poor appetite or overeating     Feeling bad about yourself - or that you are a failure or have let yourself or your family down    Trouble concentrating on things, such as reading the newspaper or watching television    Moving or speaking so slowly that other people could have noticed.  Or the opposite - being so fidgety or restless that you have been moving around a lot more than usual    Thoughts that you would be better off dead, or hurting yourself in some way    PHQ2-9 Total Score    If you checked off any problems, how difficult have these problems made it for you to do your work, take care of things at home, or get along with other people    Depression Interventions/Treatment      Today's Vitals   03/15/24 1413  Weight: 120 lb (54.4 kg)   Pain Scale:  0-10 Pain Score: 0-No pain  Medications Reviewed Today     Reviewed by Arno Rosaline SQUIBB, RN (Registered Nurse) on 03/15/24 at 1446  Med List Status: <None>   Medication Order Taking? Sig Documenting Provider Last Dose Status Informant  amoxicillin -clavulanate (AUGMENTIN ) 875-125 MG tablet [id number]  Take 1 tablet by mouth 2 (two) times daily.  Patient not taking: Reported on 03/15/2024   Antonio Cyndee [name], DO  Consider Medication Status and Discontinue (Completed Course)   azelastine  (ASTELIN ) 0.1 % nasal spray [id number] Yes Place 1 spray into both nostrils daily as needed for rhinitis. Use in each nostril as directed [provider]  Active Pharmacy Records, Self  benzonatate  (TESSALON ) 200 MG capsule [id number] Yes Take 1 capsule (200 mg total) by mouth 3 (three) times daily as needed for cough. Webb, Padonda B, FNP  Active   diphenoxylate -atropine  (LOMOTIL ) 2.5-0.025 MG tablet [id number] Yes Take 2 tablets by mouth 3 (three) times daily as needed for diarrhea or loose stools. Lowne Chase, Yvonne R, DO  Active   esomeprazole  (NEXIUM ) 40 MG capsule [id number]  Take 1 capsule (40 mg total) by mouth daily at 12 noon.  Patient not taking: Reported on 03/15/2024   Fairy Frames, MD  Active   Evolocumab  (REPATHA  SURECLICK) 140 MG/ML EMMANUEL [id number]  Inject 140 mg into the skin every 14 (fourteen) days.  Patient not taking: Reported on 03/15/2024   Lucien Orren SAILOR, PA-C  Active   furosemide  (LASIX ) 40 MG tablet [id number] Yes Take 1 tablet (40 mg total) by mouth daily.  Patient taking differently: Take 20 mg by mouth  daily.   Fairy Frames, MD  Active   levalbuterol  (XOPENEX ) 1.25 MG/3ML nebulizer solution [id number]  Take 1.25 mg by nebulization every 6 (six) hours as needed for wheezing or shortness of breath.  Patient not taking: Reported on 03/15/2024   Lowne Chase, Yvonne R, DO  Active   metoprolol  tartrate (LOPRESSOR ) 50 MG tablet [id number]  TAKE 1 TABLET TWO HOURS PRIOR TO  PROCEDURE  Patient not taking: Reported on 03/15/2024   Lucien Orren SAILOR, PA-C  Active   mirtazapine  (REMERON  SOL-TAB) 15 MG disintegrating tablet [id number] Yes Take 1 tablet (15 mg total) by mouth at bedtime.  Patient taking differently: Take 1 tablet (15 mg total) by mouth at bedtime.   Lowne Chase, Yvonne R, DO  Active   Multiple Vitamin (MULTIVITAMIN WITH MINERALS) TABS tablet [id number] Yes Take 1 tablet by mouth every Monday, Wednesday, and Friday. [provider]  Active Self, Pharmacy Records  NONFORMULARY OR COMPOUNDED ITEM [id number] Yes Portable oxygen  via [place] 2L Dx hypoxia, pulnonary fibrosis Antonio Meth, [name], DO  Active   ondansetron  (ZOFRAN ) 4 MG tablet [id number] Yes TAKE 1 TABLET BY MOUTH EVERY 8 HOURS AS NEEDED Antonio Meth [name], DO  Active   OXYGEN  [id number] Yes Inhale 2 L/min into the lungs continuous. [provider]  Active   potassium chloride  SA (KLOR-CON  M) 20 MEQ tablet [id number]  Take 1 tablet (20 mEq total) by mouth daily.  Patient not taking: Reported on 03/15/2024   Fairy Frames, MD  Active   promethazine -dextromethorphan (PROMETHAZINE -DM) 6.25-15 MG/5ML syrup [id number] Yes Take 5 mLs by mouth 4 (four) times daily as needed for cough. Antonio Meth, Yvonne R, DO  Active   promethazine -dextromethorphan (PROMETHAZINE -DM) 6.25-15 MG/5ML syrup [id number]  TAKE 5MLS BY MOUTH 4 TIMES A DAY AS NEEDED  Patient not taking: Reported on 03/01/2024   Antonio Meth [name], DO  Active   Respiratory Therapy Supplies (FULL KIT NEBULIZER SET) MISC [id number] Yes Use with DUONEB for COPD treatment Lowne Chase, Yvonne R, DO  Active Self, Pharmacy Records  rosuvastatin  (CRESTOR ) 20 MG tablet [id number] Yes Take 1 tablet (20 mg total) by mouth daily.  Patient taking differently: Take 20 mg by mouth daily. Take qod   Joseph, Preetha, MD  Active   spironolactone  (ALDACTONE ) 25 MG tablet [id number] Yes Take 1 tablet (25 mg total) by mouth daily.  Patient taking  differently: Take 25 mg by mouth daily. Taking every other day   Fairy Frames, MD  Active   SYNTHROID  112 MCG tablet [id number] Yes Take 1 tablet (112 mcg total) by mouth daily before breakfast. Antonio Meth, Yvonne R, DO  Active Self, Pharmacy Records  vitamin C  (ASCORBIC ACID ) 500 MG tablet [id number] Yes Take 500 mg by mouth every other day. [provider]  Active Self, Pharmacy Records            Recommendation:   Specialty provider follow-up [facility] regarding idiopathic pulmonary fibrosis Referral to: LCSW Continue Current Plan of Care  Follow Up Plan:   Telephone follow up appointment date/time:  03/28/24 at 1:30 PM Referral to Licensed Clinical Social Worker  Rosaline Finlay, RN MSN [place]  [facility] Population Health RN Care Manager Direct Dial: [phone]  Fax: [phone]

## 2024-03-15 NOTE — Patient Instructions (Signed)
 Visit Information  Thank you for taking time to visit with me today. Please don't hesitate to contact me if I can be of assistance to you before our next scheduled appointment.  Our next appointment is by telephone on 03/28/24 at 1:30 PM Please call the care guide team at [phone] if you need to cancel or reschedule your appointment.   Following is a copy of your care plan:   Goals Addressed             This Visit's Progress    VBCI RN Care Plan   On track    Problems:  Chronic Disease Management support and education needs related to Anxiety and idiopathic pulmonary fibrosis  Goal: Over the next 30 days the Patient will work with child psychotherapist to address Mental Health Concerns  related to the management of Anxiety as evidenced by review of electronic medical record and patient or social worker report     Schedule appointment with [facility] for idiopathic pulmonary fibrosis evaluation as evidenced by patient report and chart review  Interventions:   Evaluation of current treatment plan related to Anxiety and idiopathic pulmonary fibrosis self-management and patient's adherence to plan as established by provider. Discussed plans with patient for ongoing care management follow up and provided patient with direct contact information for care management team Evaluation of current treatment plan related to idiopathic pulmonary fibrosis and patient's adherence to plan as established by provider Advised patient to contact [facility] to schedule an appointment Reviewed medications with patient and discussed medication compliance Social Work referral for anxiety, connecting with a counselor Screening for signs and symptoms of depression related to chronic disease state  Assessed social determinant of health barriers  Patient Self-Care Activities:  Attend all scheduled provider appointments Call provider office for new concerns or questions  Perform all self care activities independently  Perform  IADL's (shopping, preparing meals, housekeeping, managing finances) independently Take medications as prescribed   Work with the social worker to address care coordination needs and will continue to work with the clinical team to address health care and disease management related needs  Plan:  Telephone follow up appointment with care management team member scheduled for:  03/28/24 at 1:30 PM             Please call the Suicide and Crisis Lifeline: 988 call 1-800-273-TALK (toll free, 24 hour hotline) if you are experiencing a Mental Health or Behavioral Health Crisis or need someone to talk to.  Patient verbalized understanding of Care plan and visit instructions communicated this visit  Rosaline Finlay, RN MSN [place]  [facility] Health RN Care Manager Direct Dial: [phone]  Fax: [phone]

## 2024-03-17 ENCOUNTER — Encounter (HOSPITAL_COMMUNITY): Payer: Self-pay

## 2024-03-17 DIAGNOSIS — J479 Bronchiectasis, uncomplicated: Secondary | ICD-10-CM | POA: Diagnosis not present

## 2024-03-17 DIAGNOSIS — J84112 Idiopathic pulmonary fibrosis: Secondary | ICD-10-CM | POA: Diagnosis not present

## 2024-03-17 DIAGNOSIS — I5033 Acute on chronic diastolic (congestive) heart failure: Secondary | ICD-10-CM | POA: Diagnosis not present

## 2024-03-17 DIAGNOSIS — I110 Hypertensive heart disease with heart failure: Secondary | ICD-10-CM | POA: Diagnosis not present

## 2024-03-17 DIAGNOSIS — I351 Nonrheumatic aortic (valve) insufficiency: Secondary | ICD-10-CM | POA: Diagnosis not present

## 2024-03-17 DIAGNOSIS — J9601 Acute respiratory failure with hypoxia: Secondary | ICD-10-CM | POA: Diagnosis not present

## 2024-03-17 NOTE — Telephone Encounter (Signed)
 Pt called. LDVM to return call or go to [facility] clinic

## 2024-03-20 ENCOUNTER — Other Ambulatory Visit: Payer: Self-pay | Admitting: Family Medicine

## 2024-03-20 ENCOUNTER — Ambulatory Visit: Payer: Self-pay

## 2024-03-20 ENCOUNTER — Other Ambulatory Visit (HOSPITAL_COMMUNITY): Payer: Self-pay | Admitting: *Deleted

## 2024-03-20 ENCOUNTER — Telehealth (HOSPITAL_COMMUNITY): Payer: Self-pay | Admitting: *Deleted

## 2024-03-20 DIAGNOSIS — R053 Chronic cough: Secondary | ICD-10-CM

## 2024-03-20 MED ORDER — LEVALBUTEROL HCL 1.25 MG/3ML IN NEBU: 1.2500 mg | INHALATION_SOLUTION | Freq: Four times a day (QID) | RESPIRATORY_TRACT | 1 refills | Status: DC | PRN

## 2024-03-20 MED ORDER — BENZONATATE 200 MG PO CAPS: 200.0000 mg | ORAL_CAPSULE | Freq: Three times a day (TID) | ORAL | 3 refills | Status: DC | PRN

## 2024-03-20 MED ORDER — METOPROLOL TARTRATE 50 MG PO TABS: ORAL_TABLET | ORAL | 0 refills | Status: DC

## 2024-03-20 NOTE — Telephone Encounter (Unsigned)
 Copied from CRM #[id number]. Topic: Clinical - Medication Refill >> Mar 20, 2024  9:25 AM [name] wrote: Medication: levalbuterol  (XOPENEX ) 1.25 MG/3ML nebulizer solution   Has the patient contacted their pharmacy? Yes (Agent: If no, request that the patient contact the pharmacy for the refill. If patient does not wish to contact the pharmacy document the reason why and proceed with request.) (Agent: If yes, when and what did the pharmacy advise?)  This is the patient's preferred pharmacy:  [id number] [place] [place] - [address] [place] [address] Phone: [phone] Fax: [phone]  Is this the correct pharmacy for this prescription? Yes If no, delete pharmacy and type the correct one.   Has the prescription been filled recently? Yes  Is the patient out of the medication? Yes  Has the patient been seen for an appointment in the last year OR does the patient have an upcoming appointment? Yes  Can we respond through MyChart? No  Agent: Please be advised that Rx refills may take up to 3 business days. We ask that you follow-up with your pharmacy.

## 2024-03-20 NOTE — Telephone Encounter (Signed)
 Asking for refill on Lomotil  and something for nausea. Declines OV. Please advise pt.  Hey, you sent in medication for a chronic cough, she needs something to relieve Diarrhea and from nausea.

## 2024-03-20 NOTE — Telephone Encounter (Signed)
 Attempted to call patient regarding upcoming cardiac CT appointment. Left message on voicemail with name and callback number  Larey Brick RN Navigator Cardiac Imaging [facility] [phone] Office [phone] Cell

## 2024-03-20 NOTE — Telephone Encounter (Signed)
 FYI Only or Action Required?: Action required by provider: medication refill request.  Patient was last seen in primary care on 02/11/2024 by Antonio Meth, [name], DO.  Called Nurse Triage reporting Diarrhea.  Symptoms began several days ago.  Interventions attempted: Nothing.  Symptoms are: unchanged.Pt. Reports she has periodic diarrhea. Asking for refill on Lomotil  and something for nausea. Declines OV. Please advise pt.  Triage Disposition: See HCP Within 4 Hours (Or PCP Triage)  Patient/caregiver understands and will follow disposition?: No, wishes to speak with PCP      Copied from CRM #[id number]. Topic: Clinical - Red Word Triage >> Mar 20, 2024  9:00 AM [name] wrote: [facility] that prompted transfer to Nurse Triage: Pt states that she has been experiencing extreme diarrhea and hasn't been able to eat for the past 3 days, accompanied by dizziness. >> Mar 20, 2024  9:24 AM [name] wrote: Pt is calling back for nurse had a drop call Reason for Disposition  [1] SEVERE diarrhea (e.g., 7 or more times / day more than normal) AND [2] age > 60 years  Answer Assessment - Initial Assessment Questions 1. DIARRHEA SEVERITY: How bad is the diarrhea? How many more stools have you had in the past 24 hours than normal?      several 2. ONSET: When did the diarrhea begin?      3 days 3. STOOL DESCRIPTION:  How loose or watery is the diarrhea? What is the stool color? Is there any blood or mucous in the stool?     watery 4. VOMITING: Are you also vomiting? If Yes, ask: How many times in the past 24 hours?      no 5. ABDOMEN PAIN: Are you having any abdomen pain? If Yes, ask: What does it feel like? (e.g., crampy, dull, intermittent, constant)      None today 6. ABDOMEN PAIN SEVERITY: If present, ask: How bad is the pain?  (e.g., Scale 1-10; mild, moderate, or severe)     none 7. ORAL INTAKE: If vomiting, Have you been able to drink liquids? How much liquids have  you had in the past 24 hours?     Drinking water  8. HYDRATION: Any signs of dehydration? (e.g., dry mouth [not just dry lips], too weak to stand, dizziness, new weight loss) When did you last urinate?     no 9. EXPOSURE: Have you traveled to a foreign country recently? Have you been exposed to anyone with diarrhea? Could you have eaten any food that was spoiled?     no 10. ANTIBIOTIC USE: Are you taking antibiotics now or have you taken antibiotics in the past 2 months?       no 11. OTHER SYMPTOMS: Do you have any other symptoms? (e.g., fever, blood in stool)       no 12. PREGNANCY: Is there any chance you are pregnant? When was your last menstrual period?       no  Protocols used: [facility]

## 2024-03-21 ENCOUNTER — Other Ambulatory Visit: Payer: Self-pay | Admitting: Family Medicine

## 2024-03-21 ENCOUNTER — Telehealth: Payer: Self-pay

## 2024-03-21 ENCOUNTER — Ambulatory Visit (HOSPITAL_BASED_OUTPATIENT_CLINIC_OR_DEPARTMENT_OTHER): Admission: RE | Admit: 2024-03-21 | Source: Ambulatory Visit

## 2024-03-21 DIAGNOSIS — R053 Chronic cough: Secondary | ICD-10-CM

## 2024-03-21 MED ORDER — LEVALBUTEROL HCL 1.25 MG/3ML IN NEBU: 1.2500 mg | INHALATION_SOLUTION | Freq: Four times a day (QID) | RESPIRATORY_TRACT | 1 refills | Status: DC | PRN

## 2024-03-21 MED ORDER — DIPHENOXYLATE-ATROPINE 2.5-0.025 MG PO TABS: 2.0000 | ORAL_TABLET | Freq: Three times a day (TID) | ORAL | 0 refills | Status: DC | PRN

## 2024-03-21 MED ORDER — BENZONATATE 200 MG PO CAPS: 200.0000 mg | ORAL_CAPSULE | Freq: Three times a day (TID) | ORAL | 3 refills | Status: DC | PRN

## 2024-03-21 MED ORDER — ONDANSETRON HCL 4 MG PO TABS: 4.0000 mg | ORAL_TABLET | Freq: Three times a day (TID) | ORAL | 1 refills | Status: DC | PRN

## 2024-03-21 NOTE — Addendum Note (Signed)
 Addended by: ELOUISE [name] HERO on: 03/21/2024 02:15 PM   Modules accepted: Orders

## 2024-03-21 NOTE — Telephone Encounter (Signed)
 Rx's resent.

## 2024-03-21 NOTE — Telephone Encounter (Signed)
 Copied from CRM #[id number]. Topic: Clinical - Medication Question >> Mar 21, 2024 12:39 PM Aisha D wrote: Reason for CRM: Pt stated that she contacted the pharmacy in regards to the levalbuterol  (XOPENEX ) 1.25 MG/3ML nebulizer solution and the benzonatate  (TESSALON ) 200 MG capsule and they stated they didn't receive a request for the medications. Pt would like for the refill request to be resent to the pharmacy today if possible. >> Mar 21, 2024  4:14 PM China J wrote: [facility]. When checking the scripts, I saw that the pharmacy had confirmed the receipt. I called called [facility] and verified that Jaclyn Andy would be able to resend the scripts before she leaves for the day. Please call patient at [phone].

## 2024-03-21 NOTE — Telephone Encounter (Signed)
 Copied from CRM #[id number]. Topic: Clinical - Medication Question >> Mar 21, 2024 12:39 PM Aisha D wrote: Reason for CRM: Pt stated that she contacted the pharmacy in regards to the levalbuterol  (XOPENEX ) 1.25 MG/3ML nebulizer solution and the benzonatate  (TESSALON ) 200 MG capsule and they stated they didn't receive a request for the medications. Pt would like for the refill request to be resent to the pharmacy today if possible.

## 2024-03-21 NOTE — Telephone Encounter (Signed)
 [facility]- [facility] informed that [facility] have received both prescriptions- however Xopenox nebulizer solution needs a PA. Tessalon  is ready for pick up.

## 2024-03-22 ENCOUNTER — Other Ambulatory Visit (HOSPITAL_COMMUNITY): Payer: Self-pay

## 2024-03-22 ENCOUNTER — Telehealth: Payer: Self-pay

## 2024-03-22 NOTE — Telephone Encounter (Signed)
 Pharmacy Patient Advocate Encounter  Received notification from [facility] that Prior Authorization for Levalbuterol  HCl 1.25MG /3ML nebulizer solution  has been DENIED.  Full denial letter will be uploaded to the media tab. See denial reason below.  Home nebulizer drug must be billed to Medicare Part B per SSA 1861(n); Part D plan cannot pay. Ask pharmacy to bill Part B.   PA #/Case ID/Reference #: [id number]

## 2024-03-22 NOTE — Telephone Encounter (Signed)
 Pharmacy Patient Advocate Encounter   Received notification from Physician's Office that prior authorization for Levalbuterol  HCl 1.25MG /3ML nebulizer solution  is required/requested.   Insurance verification completed.   The patient is insured through [facility].   Per test claim: PA required; PA submitted to above mentioned insurance via Latent Key/confirmation #/EOC [id number] Status is pending

## 2024-03-22 NOTE — Telephone Encounter (Signed)
 Initial Comment Caller states she is having extreme diarrhea thats black in color,dizzy Translation No Nurse Assessment Nurse: Marjie, RN, Suzen Date/Time (Eastern Time): 03/21/2024 6:37:17 PM Confirm and document reason for call. If symptomatic, describe symptoms. ---Caller states she is having diarrhea that started three or four days ago. States the stools appear black, and she is experiencing dizziness. States she is having at least three loose stools a day. No fever or vomiting. Does the patient have any new or worsening symptoms? ---Yes Will a triage be completed? ---Yes Related visit to physician within the last 2 weeks? ---Yes Does the PT have any chronic conditions? (i.e. diabetes, asthma, this includes High risk factors for pregnancy, etc.) ---Yes List chronic conditions. ---IPS Is this a behavioral health or substance abuse call? ---No Guidelines Guideline Title Affirmed Question Affirmed Notes Nurse Date/Time (Eastern Time) Diarrhea Shock suspected (e.g., cold/pale/ clammy skin, too weak to stand, low BP, rapid pulse) Marjie, RN, Suzen 03/21/2024 6:39:02 PM Disp. Time Titus Time) Disposition Final User 03/21/2024 6:41:49 PM Call EMS 911 Now Yes Partin, RN, Suzen PLEASE NOTE: All timestamps contained within this report are represented as Eastern Standard Time. CONFIDENTIALTY NOTICE: This fax transmission is intended only for the addressee. It contains information that is legally privileged, confidential or otherwise protected from use or disclosure. If you are not the intended recipient, you are strictly prohibited from reviewing, disclosing, copying using or disseminating any of this information or taking any action in reliance on or regarding this information. If you have received this fax in error, please notify us  immediately by telephone so that we can arrange for its return to us . Phone: [phone], Toll-Free: [phone], Fax:  [phone] [name] 06/23/1945 Page: 1 of2 CallId: [id number] Disp. Time Titus Time) Disposition Final User 03/21/2024 6:42:42 PM 911 Outcome Documentation Partin, RN, Suzen Reason: Caller states she will wait for her daughter to reach her before calling 911. States daughter is about 20 minutes away. Final Disposition 03/21/2024 6:41:49 PM Call EMS 911 Now Yes Partin, RN, Suzen [name] Disagree/Comply Disagree Caller Understands Yes PreDisposition Did not know what to do Care Advice Given Per Guideline * Immediate medical attention is needed. You need to hang up and call 911 (or an ambulance). CALL EMS 911 NOW: * Triager Discretion: I'll call you back in a few minutes to be sure you were able to reach them. FIRST AID - LIE DOWN FOR SHOCK: * Lie down with the feet elevated. * Reason: Treatment for shock. CARE ADVICE given per Diarrhea (Adult) guideline. Comments User: Suzen Marjie, RN Date/Time Titus Time): 03/21/2024 6:40:42 PM Caller states she is stuck on her toilet because she is scared of falling from her dizziness. User: Suzen Marjie, RN Date/Time Titus Time): 03/21/2024 6:41:48 PM Caller states she will wait for her daughter to reach her before calling 911. States her daughter is 20 minutes away

## 2024-03-23 ENCOUNTER — Other Ambulatory Visit (HOSPITAL_COMMUNITY): Payer: Self-pay

## 2024-03-23 ENCOUNTER — Other Ambulatory Visit: Payer: Self-pay | Admitting: Licensed Clinical Social Worker

## 2024-03-23 MED ORDER — LEVALBUTEROL HCL 1.25 MG/3ML IN NEBU: 1.2500 mg | INHALATION_SOLUTION | Freq: Four times a day (QID) | RESPIRATORY_TRACT | 1 refills | Status: AC | PRN

## 2024-03-23 NOTE — Telephone Encounter (Signed)
 Tried calling Pt- she answered, then hung up.

## 2024-03-23 NOTE — Patient Instructions (Signed)
 Visit Information  Thank you for taking time to visit with me today. Please don't hesitate to contact me if I can be of assistance to you in the future.  Please call the care guide team at [phone] if you need to cancel or reschedule your appointment.   Please call 911 if you are experiencing a Mental Health or Behavioral Health Crisis or need someone to talk to.  Patient verbalized understanding of Care plan and visit instructions communicated this visit  Cena Ligas, LCSW Clinical Social Worker [facility] [employer]

## 2024-03-23 NOTE — Telephone Encounter (Signed)
 [facility]- [facility] are running [facility]  through Part [facility].

## 2024-03-23 NOTE — Patient Outreach (Signed)
 LCSW called patient on the phone. LCSW introduced self and explained reason for the call. Patient stated that she is not interested in a therapist at this time. Patient stated that she is taking care of her husband and could use help with that. Patient stated that she is interested in some personal care services. Patient is also interested in receiving information about caregiver resources. LCSW sent this information to patients email as requested. Patient declined setting follow up appointment at this time. LCSW gave patient contact information if any new needs arise.   Cena Ligas, LCSW Clinical Social Worker [facility]

## 2024-03-23 NOTE — Addendum Note (Signed)
 Addended by: Shelagh Rayman D on: 03/23/2024 02:31 PM   Modules accepted: Orders

## 2024-03-24 ENCOUNTER — Encounter (HOSPITAL_COMMUNITY): Payer: Self-pay

## 2024-03-27 ENCOUNTER — Telehealth: Payer: Self-pay | Admitting: Cardiovascular Disease

## 2024-03-27 ENCOUNTER — Telehealth (HOSPITAL_COMMUNITY): Payer: Self-pay | Admitting: Emergency Medicine

## 2024-03-27 NOTE — Telephone Encounter (Signed)
 error

## 2024-03-27 NOTE — Telephone Encounter (Signed)
 Patient is concerned about taking her CT Morphology test and wants a call back to discuss next steps.

## 2024-03-27 NOTE — Telephone Encounter (Signed)
 I spoke with patient. She no longer has her CT instructions and does not use my chart.  I verbally went over instructions with her.   Phone numbers provider for nurse navigators and for CT scheduling.

## 2024-03-28 ENCOUNTER — Ambulatory Visit (HOSPITAL_BASED_OUTPATIENT_CLINIC_OR_DEPARTMENT_OTHER)

## 2024-03-28 ENCOUNTER — Other Ambulatory Visit: Payer: Self-pay | Admitting: Family Medicine

## 2024-03-28 ENCOUNTER — Other Ambulatory Visit: Payer: Self-pay

## 2024-03-28 NOTE — Patient Outreach (Signed)
 Complex Care Management   Visit Note  03/28/2024  Name:  Alexandria [name] MRN: [id number] DOB: 1945-05-02  Situation: Referral received for Complex Care Management related to idiopathic pulmonary fibrosis I obtained verbal consent from Patient.  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 the left ventricular wall motion is normal   Hypothyroidism    IPF (idiopathic pulmonary fibrosis) (HCC) 01/2018   Myocardial infarction (HCC)    hx of 30 years ago   Pancreatitis    TIA (transient ischemic attack)    8 years ago    Assessment: Patient Reported Symptoms:  Cognitive Cognitive Status: Able to follow simple commands, Alert and oriented to person, place, and time, Normal speech and language skills Cognitive/Intellectual Conditions Management [RPT]: None reported or documented in medical history or problem list   Health Maintenance Behaviors: Annual physical exam Health Facilitated by: Rest  Neurological Neurological Review of Symptoms: Not assessed    HEENT HEENT Symptoms Reported: Not assessed      Cardiovascular Cardiovascular Symptoms Reported: Not assessed    Respiratory Respiratory Symptoms Reported: Productive cough, Shortness of breath Additional Respiratory Details: Patient reports she is scheduled with [facility] early next month, but she is unsure if she is going to go because she doesn't want to [facility] testing and because the appointments are scheduled  throughout several days. Strongly encouraged to attend appointments as scheduled due to ongoing respiratory concerns Respiratory Management Strategies: Adequate rest, Breathing exercise, Breathing techniques, Oxygen  therapy, Routine screening, Medication therapy  Endocrine Endocrine Symptoms Reported: Not assessed    Gastrointestinal Gastrointestinal Symptoms Reported: Not assessed      Genitourinary Genitourinary Symptoms Reported: Not assessed    Integumentary Integumentary Symptoms Reported: Not assessed    Musculoskeletal Musculoskelatal Symptoms Reviewed: Not assessed        Psychosocial Psychosocial Symptoms Reported: Other Other Psychosocial Conditions: Patient reports she is still not interested in speaking with a therapist          03/28/2024    PHQ2-9 Depression Screening   Little interest or pleasure in doing things    Feeling down, depressed, or hopeless    PHQ-2 - Total Score    Trouble falling or staying asleep, or sleeping too much    Feeling tired or having little energy    Poor appetite or overeating     Feeling bad about yourself - or that you are a failure or have let yourself or your family down    Trouble concentrating on things, such as reading the newspaper or watching television    Moving or speaking so slowly that other people could have noticed.  Or the opposite - being so fidgety or restless that you have been moving around a lot more than usual    Thoughts that you would be better off dead, or hurting yourself in some way    PHQ2-9 Total Score  If you checked off any problems, how difficult have these problems made it for you to do your work, take care of things at home, or get along with other people    Depression Interventions/Treatment      There were no vitals filed for this visit.    Medications Reviewed Today     Reviewed by Arno Rosaline SQUIBB, RN (Registered Nurse) on 03/28/24 at 1344  Med List Status: <None>   Medication Order  Taking? Sig Documenting Provider Last Dose Status Informant  amoxicillin -clavulanate (AUGMENTIN ) 875-125 MG tablet [id number]  Take 1 tablet by mouth 2 (two) times daily.  Patient not taking: Reported on 03/15/2024   Lowne Chase, Yvonne R, DO  Active   azelastine  (ASTELIN ) 0.1 % nasal spray [id number]  Place 1 spray into both nostrils daily as needed for rhinitis. Use in each nostril as directed [provider]  Active Pharmacy Records, Self  benzonatate  (TESSALON ) 200 MG capsule [id number]  Take 1 capsule (200 mg total) by mouth 3 (three) times daily as needed for cough. Lowne Chase, Yvonne R, DO  Active   diphenoxylate -atropine  (LOMOTIL ) 2.5-0.025 MG tablet [id number]  Take 2 tablets by mouth 3 (three) times daily as needed for diarrhea or loose stools. Lowne Chase, Yvonne R, DO  Active   esomeprazole  (NEXIUM ) 40 MG capsule [id number]  Take 1 capsule (40 mg total) by mouth daily at 12 noon.  Patient not taking: Reported on 03/15/2024   Fairy Frames, MD  Active   Evolocumab  (REPATHA  SURECLICK) 140 MG/ML EMMANUEL [id number]  Inject 140 mg into the skin every 14 (fourteen) days.  Patient not taking: Reported on 03/15/2024   Lucien Orren SAILOR, PA-C  Active   furosemide  (LASIX ) 40 MG tablet [id number]  Take 1 tablet (40 mg total) by mouth daily.  Patient taking differently: Take 20 mg by mouth daily.   Fairy Frames, MD  Active   levalbuterol  (XOPENEX ) 1.25 MG/3ML nebulizer solution [id number]  Take 1.25 mg by nebulization every 6 (six) hours as needed for wheezing or shortness of breath. Antonio Meth, Yvonne R, DO  Active   metoprolol  tartrate (LOPRESSOR ) 50 MG tablet [id number]  TAKE 1 TABLET TWO HOURS PRIOR TO PROCEDURE Conte, Tessa N, PA-C  Active   mirtazapine  (REMERON  SOL-TAB) 15 MG disintegrating tablet [id number]  Take 1 tablet (15 mg total) by mouth at bedtime. Lowne Chase, Yvonne R, DO  Active   Multiple Vitamin (MULTIVITAMIN WITH MINERALS) TABS tablet [id number]  Take 1 tablet by mouth every  Monday, Wednesday, and Friday. [provider]  Active Self, Pharmacy Records  NONFORMULARY OR COMPOUNDED ITEM [id number]  Portable oxygen  via [place] 2L Dx hypoxia, pulnonary fibrosis Antonio Meth, [name], DO  Active   ondansetron  (ZOFRAN ) 4 MG tablet [id number]  Take 1 tablet (4 mg total) by mouth every 8 (eight) hours as needed. Lowne Chase, Yvonne R, [place]  Active   OXYGEN  [id number]  Inhale 2 L/min into the lungs continuous. [provider]  Active   potassium chloride  SA (KLOR-CON  M) 20 MEQ tablet [id number]  Take 1 tablet (20 mEq total) by mouth daily.  Patient not taking: Reported on 03/15/2024   Fairy Frames, MD  Active   promethazine -dextromethorphan (PROMETHAZINE -DM) 6.25-15 MG/5ML syrup [id number]  Take 5 mLs by mouth 4 (four) times daily as needed for cough. Antonio Meth [name], DO  Active   Respiratory Therapy Supplies (FULL KIT NEBULIZER SET) MISC [id number]  Use with DUONEB for COPD treatment Lowne Chase, Yvonne R, DO  Active  Self, Pharmacy Records  rosuvastatin  (CRESTOR ) 20 MG tablet [id number]  Take 1 tablet (20 mg total) by mouth daily.  Patient taking differently: Take 20 mg by mouth daily. Take qod   Fairy Frames, MD  Active   spironolactone  (ALDACTONE ) 25 MG tablet [id number]  Take 1 tablet (25 mg total) by mouth daily.  Patient taking differently: Take 25 mg by mouth daily. Taking every other day   Fairy Frames, MD  Active   SYNTHROID  112 MCG tablet [id number]  Take 1 tablet (112 mcg total) by mouth daily before breakfast. Antonio Meth, Yvonne R, DO  Active Self, Pharmacy Records  vitamin C  (ASCORBIC ACID ) 500 MG tablet [id number]  Take 500 mg by mouth every other day. [provider]  Active Self, Pharmacy Records            Recommendation:   Specialty provider follow-up [facility] Continue Current Plan of Care  Follow Up Plan:   Closing From:  Complex Care Management Patient declines CMRN follow-up  Rosaline Finlay, RN MSN [place]  [facility] RN Care Manager Direct Dial: [phone]  Fax: [phone]

## 2024-03-28 NOTE — Patient Instructions (Signed)
 Visit Information  Thank you for taking time to visit with me today. Please don't hesitate to contact me if I can be of assistance to you before our next scheduled appointment.  Your next care management appointment is no further scheduled appointments.   Closing From: Complex Care Management.  Please call the care guide team at [phone] if you need to cancel, schedule, or reschedule an appointment.   Please call the Suicide and Crisis Lifeline: 988 call 1-800-273-TALK (toll free, 24 hour hotline) if you are experiencing a Mental Health or Behavioral Health Crisis or need someone to talk to.  Theodora Fish, RN MSN [place]  [facility] RN Care Manager Direct Dial: [phone]  Fax: [phone]

## 2024-03-31 ENCOUNTER — Other Ambulatory Visit: Payer: Self-pay | Admitting: Family Medicine

## 2024-03-31 DIAGNOSIS — E039 Hypothyroidism, unspecified: Secondary | ICD-10-CM

## 2024-03-31 DIAGNOSIS — R051 Acute cough: Secondary | ICD-10-CM

## 2024-03-31 NOTE — Telephone Encounter (Unsigned)
 Copied from CRM #[id number]. Topic: Clinical - Medication Refill >> Mar 31, 2024 12:23 PM Alfonso HERO wrote: Medication: promethazine -dextromethorphan (PROMETHAZINE -DM) 6.25-15 MG/5ML syrup  Has the patient contacted their pharmacy? No (Agent: If no, request that the patient contact the pharmacy for the refill. If patient does not wish to contact the pharmacy document the reason why and proceed with request.) (Agent: If yes, when and what did the pharmacy advise?)  This is the patient's preferred pharmacy:  [id number] [place], [place] - [address] [place] [address] Phone: [phone] Fax: [phone]  Is this the correct pharmacy for this prescription? Yes If no, delete pharmacy and type the correct one.   Has the prescription been filled recently? Yes  Is the patient out of the medication? Yes  Has the patient been seen for an appointment in the last year OR does the patient have an upcoming appointment? Yes  Can we respond through MyChart? Yes  Agent: Please be advised that Rx refills may take up to 3 business days. We ask that you follow-up with your pharmacy.

## 2024-04-03 MED ORDER — PROMETHAZINE-DM 6.25-15 MG/5ML PO SYRP: 5.0000 mL | ORAL_SOLUTION | Freq: Four times a day (QID) | ORAL | 0 refills | Status: DC | PRN

## 2024-04-07 ENCOUNTER — Telehealth: Payer: Self-pay | Admitting: Cardiovascular Disease

## 2024-04-07 ENCOUNTER — Encounter (HOSPITAL_COMMUNITY): Payer: Self-pay

## 2024-04-07 NOTE — Telephone Encounter (Signed)
 Patient would like to clarify medication instructions for 1/06 CT [place]. Please advise.

## 2024-04-07 NOTE — Telephone Encounter (Signed)
 Spoke with patient. Reviewed CTA instructions.   Reviewed where to go for fasting lipids.   No further assistance needed at this time.

## 2024-04-10 ENCOUNTER — Telehealth (HOSPITAL_COMMUNITY): Payer: Self-pay | Admitting: Emergency Medicine

## 2024-04-10 ENCOUNTER — Telehealth: Payer: Self-pay | Admitting: Cardiovascular Disease

## 2024-04-10 MED ORDER — METOPROLOL TARTRATE 50 MG PO TABS: ORAL_TABLET | ORAL | 0 refills | Status: AC

## 2024-04-10 NOTE — Telephone Encounter (Signed)
 Patient states she mistakenly took Lopressor  for CT scan today. [facility] and reviewed with patient she is to take this medication 2 hours prior to her CT scan scheduled for tomorrow 04/11/24. Patient verbalized understanding.

## 2024-04-10 NOTE — Telephone Encounter (Signed)
 Attempted to call patient regarding upcoming cardiac CT appointment. Left message on voicemail with name and callback number Rockwell Alexandria RN Navigator Cardiac Imaging [facility] [phone] Office [phone] Cell

## 2024-04-10 NOTE — Telephone Encounter (Signed)
 Patient took the medication today by mistake for CT, instead of tomorrow when she was suppose. Calling to see what she needs to do. Please advise

## 2024-04-11 ENCOUNTER — Other Ambulatory Visit: Payer: Self-pay | Admitting: Cardiovascular Disease

## 2024-04-11 ENCOUNTER — Encounter (HOSPITAL_BASED_OUTPATIENT_CLINIC_OR_DEPARTMENT_OTHER): Payer: Self-pay

## 2024-04-11 ENCOUNTER — Ambulatory Visit (HOSPITAL_BASED_OUTPATIENT_CLINIC_OR_DEPARTMENT_OTHER)
Admission: RE | Admit: 2024-04-11 | Discharge: 2024-04-11 | Disposition: A | Discharge status: Home / Self Care | Source: Ambulatory Visit | Attending: Cardiovascular Disease | Admitting: Cardiovascular Disease

## 2024-04-11 ENCOUNTER — Ambulatory Visit (HOSPITAL_BASED_OUTPATIENT_CLINIC_OR_DEPARTMENT_OTHER)
Admission: RE | Admit: 2024-04-11 | Discharge: 2024-04-11 | Disposition: A | Discharge status: Home / Self Care | Source: Ambulatory Visit | Attending: Physician Assistant | Admitting: Physician Assistant

## 2024-04-11 DIAGNOSIS — I4719 Other supraventricular tachycardia: Secondary | ICD-10-CM | POA: Diagnosis present

## 2024-04-11 DIAGNOSIS — I10 Essential (primary) hypertension: Secondary | ICD-10-CM | POA: Diagnosis present

## 2024-04-11 DIAGNOSIS — J849 Interstitial pulmonary disease, unspecified: Secondary | ICD-10-CM | POA: Insufficient documentation

## 2024-04-11 DIAGNOSIS — R000 Tachycardia, unspecified: Secondary | ICD-10-CM | POA: Insufficient documentation

## 2024-04-11 DIAGNOSIS — I351 Nonrheumatic aortic (valve) insufficiency: Secondary | ICD-10-CM | POA: Diagnosis present

## 2024-04-11 DIAGNOSIS — R072 Precordial pain: Secondary | ICD-10-CM | POA: Diagnosis present

## 2024-04-11 DIAGNOSIS — I491 Atrial premature depolarization: Secondary | ICD-10-CM | POA: Insufficient documentation

## 2024-04-11 DIAGNOSIS — I77810 Thoracic aortic ectasia: Secondary | ICD-10-CM | POA: Diagnosis present

## 2024-04-11 DIAGNOSIS — I5032 Chronic diastolic (congestive) heart failure: Secondary | ICD-10-CM | POA: Diagnosis present

## 2024-04-11 DIAGNOSIS — Z79899 Other long term (current) drug therapy: Secondary | ICD-10-CM | POA: Diagnosis present

## 2024-04-11 DIAGNOSIS — R002 Palpitations: Secondary | ICD-10-CM | POA: Diagnosis present

## 2024-04-11 DIAGNOSIS — I4510 Unspecified right bundle-branch block: Secondary | ICD-10-CM | POA: Diagnosis present

## 2024-04-11 DIAGNOSIS — R931 Abnormal findings on diagnostic imaging of heart and coronary circulation: Secondary | ICD-10-CM | POA: Diagnosis present

## 2024-04-11 MED ORDER — NITROGLYCERIN 0.4 MG SL SUBL
0.8000 mg | SUBLINGUAL_TABLET | Freq: Once | SUBLINGUAL | Status: AC
  Administered 2024-04-11: 0.8 mg via SUBLINGUAL

## 2024-04-11 MED ORDER — IOHEXOL 350 MG/ML SOLN
100.0000 mL | Freq: Once | INTRAVENOUS | Status: AC | PRN
  Administered 2024-04-11: 95 mL via INTRAVENOUS

## 2024-04-11 NOTE — Progress Notes (Signed)
 CT FFR ordered.  Signed, Darryle DASEN. Barbaraann, MD, [facility]  [facility]  [address] [place], [place] [address] [phone]  7:18 PM

## 2024-04-12 ENCOUNTER — Telehealth: Payer: Self-pay

## 2024-04-12 ENCOUNTER — Other Ambulatory Visit: Payer: Self-pay | Admitting: Family

## 2024-04-12 ENCOUNTER — Ambulatory Visit: Payer: Self-pay | Admitting: Physician Assistant

## 2024-04-12 MED ORDER — ALBUTEROL SULFATE HFA 108 (90 BASE) MCG/ACT IN AERS: 2.0000 | INHALATION_SPRAY | Freq: Four times a day (QID) | RESPIRATORY_TRACT | 1 refills | Status: AC | PRN

## 2024-04-12 NOTE — Telephone Encounter (Signed)
 [facility], Pt is requesting a refill on albuterol  inhaler, no longer on med list. Okay to reactivate and send?

## 2024-04-13 LAB — LIPID PANEL
Chol/HDL Ratio: 3.2 ratio (ref 0.0–4.4)
Cholesterol, Total: 214 mg/dL — ABNORMAL HIGH (ref 100–199)
HDL: 67 mg/dL
LDL Chol Calc (NIH): 127 mg/dL — ABNORMAL HIGH (ref 0–99)
Triglycerides: 111 mg/dL (ref 0–149)
VLDL Cholesterol Cal: 20 mg/dL (ref 5–40)

## 2024-04-14 NOTE — Telephone Encounter (Signed)
 Pt is calling regarding her CT results and Pt can't get into Mychart. Pt requesting a callback Please advise

## 2024-04-14 NOTE — Telephone Encounter (Signed)
 Patient calling again for test results. Please advise.

## 2024-04-14 NOTE — Telephone Encounter (Signed)
 Pt calling again to f/u on results. Please advise

## 2024-04-18 MED ORDER — RANOLAZINE ER 500 MG PO TB12: 500.0000 mg | ORAL_TABLET | Freq: Two times a day (BID) | ORAL | 3 refills | Status: AC

## 2024-04-20 ENCOUNTER — Telehealth: Admitting: Family Medicine

## 2024-04-20 DIAGNOSIS — E782 Mixed hyperlipidemia: Secondary | ICD-10-CM

## 2024-04-20 DIAGNOSIS — E785 Hyperlipidemia, unspecified: Secondary | ICD-10-CM | POA: Diagnosis not present

## 2024-04-20 DIAGNOSIS — J0140 Acute pansinusitis, unspecified: Secondary | ICD-10-CM | POA: Diagnosis not present

## 2024-04-20 MED ORDER — ROSUVASTATIN CALCIUM 20 MG PO TABS: 20.0000 mg | ORAL_TABLET | Freq: Every day | ORAL | 3 refills | Status: AC

## 2024-04-20 MED ORDER — AMOXICILLIN-POT CLAVULANATE 875-125 MG PO TABS: 1.0000 | ORAL_TABLET | Freq: Two times a day (BID) | ORAL | 0 refills | Status: AC

## 2024-04-20 NOTE — Progress Notes (Signed)
 "   MyChart Video Visit    Virtual Visit via Video Note   This patient is at least at moderate risk for complications without adequate follow up. This format is felt to be most appropriate for this patient at this time. Physical exam was limited by quality of the video and audio technology used for the visit. heather was able to get the patient set up on a video visit.  Patient location: home  Patient and provider in visit Provider location: Office  I discussed the limitations of evaluation and management by telemedicine and the availability of in person appointments. The patient expressed understanding and agreed to proceed.  Visit Date: 04/20/2024  Today's healthcare provider: [name] Antonio Cyndee, DO     Subjective:    Patient ID: Alexandria [name], female    DOB: 1945-05-25, 79 y.o.   MRN: [id number]  Chief Complaint  Patient presents with   Lab Review    HPI Patient is in today for f/u labs .    She was told by cardiology to f/u with us  on cholesterol  she needs medication.   Discussed the use of AI scribe software for clinical note transcription with the patient, who gave verbal consent to proceed.  History of Present Illness Alexandria [name] is a 79 year old female with pulmonary hypertension who presents with worsening shortness of breath.  She has experienced worsening shortness of breath over the past four to five days, which has become debilitating. Breathlessness occurs with minimal exertion, such as getting dressed or washing her hair, and even while sitting. She describes feeling 'out of breath' and notes that her condition has deteriorated rapidly compared to her last visit. Oxygen  saturation is reportedly 95-98% despite feeling breathless.  She started taking Ranolazine  (Ranexa ) two days ago, with a dosage of two pills per day, and has taken three doses so far. She was told by her nurse that Ranolazine  (Ranexa ) is supposed to help with her  breathlessness and chest pain.  She mentions that her cholesterol levels are high. She enjoys eating pasta with shrimp and lobster, which may not be beneficial for her cholesterol levels.  She reports sinus issues, with symptoms of waking up with sweats and chills, but no fever. She experiences bone pain on one side of her face, which she attributes to her sinuses.   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 the left ventricular wall motion is normal   Hypothyroidism    IPF (idiopathic pulmonary fibrosis) (HCC) 01/2018   Myocardial infarction (HCC)    hx of 30 years ago   Pancreatitis    TIA (transient ischemic attack)    8 years ago    Past Surgical History:  Procedure Laterality Date   BREAST BIOPSY Left    [facility]   CARDIAC CATHETERIZATION     10/2013   CATARACT EXTRACTION Bilateral 03/22/2018   FLEXIBLE SIGMOIDOSCOPY N/A 08/13/2022   Procedure: FLEXIBLE  SIGMOIDOSCOPY;  Surgeon: Sheldon Standing, MD;  Location: [facility];  Service: General;  Laterality: N/A;   ILEOSTOMY CLOSURE N/A 11/19/2022   Procedure: OPEN TAKEDOWN OF LOOP ILEOSTOMY;  Surgeon: Sheldon Standing, MD;  Location: [facility];  Service: General;  Laterality: N/A;   LEFT HEART CATHETERIZATION WITH CORONARY ANGIOGRAM N/A 10/25/2013   Procedure: LEFT HEART CATHETERIZATION WITH CORONARY ANGIOGRAM;  Surgeon: Debby DELENA Sor, MD;  Location: [facility] CATH LAB;  Service: Cardiovascular;  Laterality: N/A;   LYSIS OF ADHESION N/A 08/13/2022   Procedure: LYSIS OF ADHESION, DIVERTING  ILEOSTOMY;  Surgeon: Sheldon Standing, MD;  Location: [facility];  Service: General;  Laterality: N/A;   NECK SURGERY     ACDF x 2      done in [place] more than 25 years, doesn't know levels   RHINOPLASTY  08/16/2023   uses [place] in her nostril   TOTAL KNEE ARTHROPLASTY     Bilateral x's 2   VAGINAL HYSTERECTOMY  10/17/1998   Alexandria [name]   VIDEO BRONCHOSCOPY Bilateral 01/24/2018   Procedure: VIDEO BRONCHOSCOPY WITH FLUORO;  Surgeon: Alaine Vicenta NOVAK, MD;  Location: [facility] ENDOSCOPY;  Service: Cardiopulmonary;  Laterality: Bilateral;   XI ROBOTIC ASSISTED COLOSTOMY TAKEDOWN N/A 08/13/2022   Procedure: ROBOTIC OSTOMY TAKEDOWN, SMALL BOWEL RESECTION X 2, BILATERAL TAP BLOCK, TISSUE PERFUSSION ASSESMENT VIA FIREFLY, MOBILAZATION OF SPLENIC FLEXURE;  Surgeon: Sheldon Standing, MD;  Location: [facility];  Service: General;  Laterality: N/A;   XI ROBOTIC ASSISTED LOWER ANTERIOR RESECTION N/A 03/31/2022   Procedure: XI ROBOTIC ASSISTED LOWER ANTERIOR RESECTION;  Surgeon: Sheldon Standing, MD;  Location: [facility];  Service: General;  Laterality: N/A;    Family History  Problem Relation Age of Onset   Ovarian cancer Mother    Uterine cancer Mother    Lung cancer Father    HIV Brother        29   Kidney cancer Brother    Lung cancer Brother    Other Brother        Mouth Cancer   Colon cancer Maternal Aunt    Heart disease Maternal Grandmother    Stroke Maternal Grandmother    Hypertension Maternal Grandmother    Diabetes Maternal Grandmother    Arthritis Other    Esophageal cancer Neg Hx    Liver disease Neg Hx    Rectal cancer Neg Hx    Stomach cancer Neg Hx     Social History   Socioeconomic History   Marital status: Married    Spouse name: [name]   Number of children: 3   Years of education: Jr college   Highest education level: Not on file  Occupational History   Occupation: Leisure Centre Manager: UNEMPLOYED   Occupation: retired  Tobacco Use   Smoking status: Never   Smokeless tobacco:  Never  Vaping Use   Vaping status: Never Used  Substance and Sexual Activity   Alcohol  use: No    Alcohol /week: 0.0 standard drinks of alcohol    Drug use: No   Sexual activity: Not Currently  Other Topics Concern   Not on file  Social History Narrative   Lives with husband [name]   Caffeine use: coffee (2 cups per day)   Mostly right-handed   Social Drivers of Health   Tobacco Use: Medium Risk (03/08/2024)   Received from [facility]   Patient History    Smoking Tobacco Use: Former    Smokeless Tobacco Use: Never    Passive Exposure: Not on  Actuary Strain: Not on file  Food Insecurity: No Food Insecurity (03/15/2024)   Epic    Worried About Programme Researcher, Broadcasting/film/video in the Last Year: Never true    Ran Out of Food in the Last Year: Never true  Transportation Needs: No Transportation Needs (03/15/2024)   Epic    Lack of Transportation (Medical): No    Lack of Transportation (Non-Medical): No  Physical Activity: Not on file  Stress: Not on file  Social Connections: Unknown (01/24/2024)   Social Connection and Isolation Panel    Frequency of Communication with Friends and Family: Patient declined    Frequency of Social Gatherings with Friends and Family: Patient declined    Attends Religious Services: Patient declined    Database Administrator or Organizations: Patient declined    Attends Banker Meetings: Patient declined    Marital Status: Married  Catering Manager Violence: Not At Risk (03/15/2024)   Epic    Fear of Current or Ex-Partner: No    Emotionally Abused: No    Physically Abused: No    Sexually Abused: No  Depression (PHQ2-9): Low Risk (03/15/2024)   Depression (PHQ2-9)    PHQ-2 Score: 0  Alcohol  Screen: Not on file  Housing: Low Risk (03/15/2024)   Epic    Unable to Pay for Housing in the Last Year: No    Number of Times Moved in the Last Year: 0    Homeless in the Last Year: No  Utilities: Not At Risk (03/15/2024)   Epic     Threatened with loss of utilities: No  Health Literacy: Not on file    Outpatient Medications Prior to Visit  Medication Sig Dispense Refill   albuterol  (VENTOLIN  HFA) 108 (90 Base) MCG/ACT inhaler Inhale 2 puffs into the lungs every 6 (six) hours as needed for wheezing or shortness of breath. 8 g 1   azelastine  (ASTELIN ) 0.1 % nasal spray Place 1 spray into both nostrils daily as needed for rhinitis. Use in each nostril as directed     benzonatate  (TESSALON ) 200 MG capsule Take 1 capsule (200 mg total) by mouth 3 (three) times daily as needed for cough. 20 capsule 3   diphenoxylate -atropine  (LOMOTIL ) 2.5-0.025 MG tablet Take 2 tablets by mouth 3 (three) times daily as needed for diarrhea or loose stools. 30 tablet 0   furosemide  (LASIX ) 40 MG tablet Take 1 tablet (40 mg total) by mouth daily. (Patient taking differently: Take 20 mg by mouth daily.) 30 tablet 0   levalbuterol  (XOPENEX ) 1.25 MG/3ML nebulizer solution Take 1.25 mg by nebulization every 6 (six) hours as needed for wheezing or shortness of breath. 180 mL 1   metoprolol  tartrate (LOPRESSOR ) 50 MG tablet TAKE 1 TABLET TWO HOURS PRIOR TO PROCEDURE ON 04/11/2024. 1 tablet 0   mirtazapine  (REMERON  SOL-TAB) 15 MG disintegrating tablet Take 1 tablet (15 mg total) by mouth at bedtime. 90 tablet 0   Multiple Vitamin (MULTIVITAMIN WITH MINERALS) TABS tablet Take 1 tablet by mouth every Monday, Wednesday, and Friday.     NONFORMULARY OR COMPOUNDED ITEM Portable oxygen  via [place] 2L Dx hypoxia, pulnonary fibrosis 1 each 1   ondansetron  (ZOFRAN ) 4 MG tablet Take 1 tablet (4 mg total) by mouth every 8 (eight) hours as needed. 20 tablet 1   OXYGEN  Inhale 2 L/min into the lungs continuous.     promethazine -dextromethorphan (PROMETHAZINE -DM) 6.25-15 MG/5ML syrup Take 5 mLs by mouth 4 (four) times daily as needed for cough. 118  mL 0   ranolazine  (RANEXA ) 500 MG 12 hr tablet Take 1 tablet (500 mg total) by mouth 2 (two) times daily. 60 tablet 3    Respiratory Therapy Supplies (FULL KIT NEBULIZER SET) MISC Use with DUONEB for COPD treatment 1 each 0   rosuvastatin  (CRESTOR ) 20 MG tablet Take 1 tablet (20 mg total) by mouth daily. (Patient taking differently: Take 20 mg by mouth daily. Take qod) 30 tablet 0   spironolactone  (ALDACTONE ) 25 MG tablet Take 1 tablet (25 mg total) by mouth daily. (Patient taking differently: Take 25 mg by mouth daily. Taking every other day) 30 tablet 1   SYNTHROID  112 MCG tablet TAKE 1 TABLET BY MOUTH DAILY BEFORE BREAKFAST. 90 tablet 0   vitamin C  (ASCORBIC ACID ) 500 MG tablet Take 500 mg by mouth every other day.     amoxicillin -clavulanate (AUGMENTIN ) 875-125 MG tablet Take 1 tablet by mouth 2 (two) times daily. (Patient not taking: Reported on 03/15/2024) 20 tablet 0   esomeprazole  (NEXIUM ) 40 MG capsule Take 1 capsule (40 mg total) by mouth daily at 12 noon. (Patient not taking: Reported on 03/15/2024) 30 capsule 0   Evolocumab  (REPATHA  SURECLICK) 140 MG/ML SOAJ Inject 140 mg into the skin every 14 (fourteen) days. (Patient not taking: Reported on 03/15/2024) 6 mL 3   potassium chloride  SA (KLOR-CON  M) 20 MEQ tablet Take 1 tablet (20 mEq total) by mouth daily. (Patient not taking: Reported on 03/15/2024) 30 tablet 0   No facility-administered medications prior to visit.    Allergies  Allergen Reactions   Influenza Vaccines Other (See Comments)    Pt reports heart attack after flu shot   Prednisone  Other (See Comments)    High doses caused mental status changes.   Dilaudid  [Hydromorphone ] Itching and Other (See Comments)    Dilaudid  caused marked confusion   Ipratropium Bromide  Itching and Other (See Comments)    Mental Status Changes, Insomnia   Cheratussin Ac [Guaifenesin -Codeine ] Other (See Comments)    Headaches - patient denies   Codeine  Itching, Nausea Only and Other (See Comments)    Pt takes promethazine  with codeine  at home   Diltiazem  Nausea And Vomiting   Hydrocodone Itching   Ibuprofen   Itching and Other (See Comments)    Gives false reading in blood   Morphine  Itching   Oxycodone  Itching    Review of Systems  Constitutional:  Negative for fever and malaise/fatigue.  HENT:  Positive for congestion and sinus pain.   Eyes:  Negative for blurred vision.  Respiratory:  Positive for cough and shortness of breath.   Cardiovascular:  Negative for chest pain, palpitations and leg swelling.  Gastrointestinal:  Negative for abdominal pain, blood in stool and nausea.  Genitourinary:  Negative for dysuria and frequency.  Musculoskeletal:  Negative for falls.  Skin:  Negative for rash.  Neurological:  Negative for dizziness, loss of consciousness and headaches.  Endo/Heme/Allergies:  Negative for environmental allergies.  Psychiatric/Behavioral:  Negative for depression. The patient is not nervous/anxious.        Objective:    Physical Exam Vitals and nursing note reviewed.  Constitutional:      Appearance: Normal appearance. She is not ill-appearing.  Neurological:     Mental Status: She is alert.  Psychiatric:        Mood and Affect: Mood normal.        Behavior: Behavior normal.        Thought Content: Thought content normal.  Judgment: Judgment normal.     There were no vitals taken for this visit. Wt Readings from Last 3 Encounters:  03/15/24 120 lb (54.4 kg)  03/01/24 122 lb (55.3 kg)  01/30/24 123 lb 7.3 oz (56 kg)       Assessment & Plan:  Assessment and Plan Assessment & Plan Acute pansinusitis   She experiences symptoms of chills, bone pain, and night sweats without fever, suggesting a possible bacterial infection. Prescribed antibiotics for treatment.  Mixed hyperlipidemia   Her cholesterol levels are elevated, influenced by dietary indiscretions such as pasta with shrimp and lobster. She has not used cholesterol medication previously. Prescribed Crestor , one pill daily. Advised dietary modifications to reduce cholesterol intake. Blood work  will be rechecked in 2-3 months.    Hyperlipidemia LDL goal <70 Assessment & Plan: Start statin Repeat labs 3 months  Encourage heart healthy diet such as MIND or DASH diet, increase exercise, avoid trans fats, simple carbohydrates and processed foods, consider a krill or fish or flaxseed oil cap daily.    Orders: -     Rosuvastatin  Calcium ; Take 1 tablet (20 mg total) by mouth daily.  Dispense: 90 tablet; Refill: 3 -     Comprehensive metabolic panel with GFR; Future -     Lipid panel; Future  Mixed hyperlipidemia Assessment & Plan: Start statin Repeat labs 3 months  Encourage heart healthy diet such as MIND or DASH diet, increase exercise, avoid trans fats, simple carbohydrates and processed foods, consider a krill or fish or flaxseed oil cap daily.    Orders: -     Rosuvastatin  Calcium ; Take 1 tablet (20 mg total) by mouth daily.  Dispense: 90 tablet; Refill: 3  Acute non-recurrent pansinusitis -     Amoxicillin -Pot Clavulanate; Take 1 tablet by mouth 2 (two) times daily.  Dispense: 20 tablet; Refill: 0     I discussed the assessment and treatment plan with the patient. The patient was provided an opportunity to ask questions and all were answered. The patient agreed with the plan and demonstrated an understanding of the instructions.   The patient was advised to call back or seek an in-person evaluation if the symptoms worsen or if the condition fails to improve as anticipated.  [name] Antonio Cyndee, DO [facility] [phone] (phone) [phone] (fax)  [facility]   "

## 2024-04-20 NOTE — Assessment & Plan Note (Signed)
 Start statin Repeat labs 3 months  Encourage heart healthy diet such as MIND or DASH diet, increase exercise, avoid trans fats, simple carbohydrates and processed foods, consider a krill or fish or flaxseed oil cap daily.

## 2024-04-24 ENCOUNTER — Telehealth: Payer: Self-pay | Admitting: Physician Assistant

## 2024-04-24 NOTE — Telephone Encounter (Signed)
 Spoke with patient. She she was started on ranexa  ~ 1 week ago. Notice no significant change in symptoms (dyspnea) despite medication. Advised there is room to titrate med up to see if more effective. Will defer to provider to review.

## 2024-04-24 NOTE — Telephone Encounter (Signed)
 Pt c/o medication issue:  1. Name of Medication: ranolazine  (RANEXA ) 500 MG 12 hr tablet   2. How are you currently taking this medication (dosage and times per day)? As prescribed   3. Are you having a reaction (difficulty breathing--STAT)? No   4. What is your medication issue? Pt has been taking medication for a week and has not noticed any significant difference. Requesting c/b to discuss. Please advise.

## 2024-04-25 NOTE — Telephone Encounter (Signed)
 Shared response from [facility] with patient: I think a lot of her SOB is coming from her lungs. I would wait until she sees pulmonary and see if there is anything they suggest before titrating her further. She has only been on the medication a week, would give it more time.   Thanks! Orren LOISE Fabry, PA-C  Patient states her appt with pulmonologist is not until 05/19/24. She reports her O2 sat via pulse ox is 98% yet she continues to feel SOB and chest pain.   Informed patient Orren recommends continuing medication and give it some more time. She repeated her O2 sat is 98% and states that's kind of odd, isn't it? Patient is not convinced her SOB is attributed to her lungs. Informed patient I will forward message back to [facility] to see if she has any other recommendations. Patient verbalized understanding.

## 2024-04-25 NOTE — Telephone Encounter (Signed)
 Pt requesting callback with answer about medication issue. Please advise.

## 2024-04-26 NOTE — Telephone Encounter (Signed)
 Spoke with the patient and gave her Tessa's recommendations. She voiced understanding but stated she had a heart attack and she had heart palpitations then she hung up the phone.

## 2024-04-28 ENCOUNTER — Other Ambulatory Visit: Payer: Self-pay | Admitting: Family Medicine

## 2024-05-03 ENCOUNTER — Other Ambulatory Visit: Payer: Self-pay | Admitting: Family Medicine

## 2024-05-04 ENCOUNTER — Other Ambulatory Visit: Payer: Self-pay

## 2024-05-04 ENCOUNTER — Other Ambulatory Visit: Payer: Self-pay | Admitting: Family

## 2024-05-04 DIAGNOSIS — R053 Chronic cough: Secondary | ICD-10-CM

## 2024-05-04 DIAGNOSIS — R051 Acute cough: Secondary | ICD-10-CM

## 2024-05-04 MED ORDER — BENZONATATE 200 MG PO CAPS: 200.0000 mg | ORAL_CAPSULE | Freq: Three times a day (TID) | ORAL | 3 refills | Status: AC | PRN

## 2024-05-04 MED ORDER — PROMETHAZINE-DM 6.25-15 MG/5ML PO SYRP: 5.0000 mL | ORAL_SOLUTION | Freq: Four times a day (QID) | ORAL | 0 refills | Status: AC | PRN

## 2024-05-04 NOTE — Telephone Encounter (Signed)
 Copied from CRM #[id number]. Topic: Clinical - Medication Refill >> May 04, 2024 11:14 AM Rea C wrote: Medication: promethazine -dextromethorphan (PROMETHAZINE -DM) 6.25-15 MG/5ML syrup;  benzonatate  (TESSALON ) 200 MG capsule - for this prescription pt asked if it can be 30 pills instead of 20.    Has the patient contacted their pharmacy? Yes, told to call clinic  (Agent: If no, request that the patient contact the pharmacy for the refill. If patient does not wish to contact the pharmacy document the reason why and proceed with request.) (Agent: If yes, when and what did the pharmacy advise?)  This is the patient's preferred pharmacy:  [id number] - [place], [place] - [address] [place] [address] Phone: [phone] Fax: [phone]  Is this the correct pharmacy for this prescription? Yes If no, delete pharmacy and type the correct one.   Has the prescription been filled recently? Yes  Is the patient out of the medication? Yes  Has the patient been seen for an appointment in the last year OR does the patient have an upcoming appointment? Yes  Can we respond through MyChart? Yes  Agent: Please be advised that Rx refills may take up to 3 business days. We ask that you follow-up with your pharmacy.
